# Patient Record
Sex: Female | Born: 1945
Health system: Southern US, Community
[De-identification: ages and names within clinical notes are randomized; demographics above are authoritative.]

## PROBLEM LIST (undated history)

## (undated) DIAGNOSIS — K449 Diaphragmatic hernia without obstruction or gangrene: Secondary | ICD-10-CM

## (undated) DIAGNOSIS — E785 Hyperlipidemia, unspecified: Secondary | ICD-10-CM

## (undated) DIAGNOSIS — K621 Rectal polyp: Secondary | ICD-10-CM

## (undated) DIAGNOSIS — K2960 Other gastritis without bleeding: Secondary | ICD-10-CM

## (undated) DIAGNOSIS — K648 Other hemorrhoids: Secondary | ICD-10-CM

## (undated) DIAGNOSIS — D126 Benign neoplasm of colon, unspecified: Secondary | ICD-10-CM

## (undated) DIAGNOSIS — E1142 Type 2 diabetes mellitus with diabetic polyneuropathy: Secondary | ICD-10-CM

## (undated) DIAGNOSIS — E039 Hypothyroidism, unspecified: Secondary | ICD-10-CM

## (undated) DIAGNOSIS — N289 Disorder of kidney and ureter, unspecified: Secondary | ICD-10-CM

## (undated) DIAGNOSIS — M1990 Unspecified osteoarthritis, unspecified site: Secondary | ICD-10-CM

## (undated) DIAGNOSIS — E119 Type 2 diabetes mellitus without complications: Secondary | ICD-10-CM

## (undated) DIAGNOSIS — I4891 Unspecified atrial fibrillation: Secondary | ICD-10-CM

## (undated) DIAGNOSIS — Z9981 Dependence on supplemental oxygen: Secondary | ICD-10-CM

## (undated) DIAGNOSIS — F419 Anxiety disorder, unspecified: Secondary | ICD-10-CM

## (undated) DIAGNOSIS — E2740 Unspecified adrenocortical insufficiency: Secondary | ICD-10-CM

## (undated) DIAGNOSIS — Z452 Encounter for adjustment and management of vascular access device: Secondary | ICD-10-CM

## (undated) DIAGNOSIS — R748 Abnormal levels of other serum enzymes: Secondary | ICD-10-CM

## (undated) DIAGNOSIS — L0390 Cellulitis, unspecified: Secondary | ICD-10-CM

## (undated) DIAGNOSIS — G8929 Other chronic pain: Secondary | ICD-10-CM

## (undated) DIAGNOSIS — IMO0002 Reserved for concepts with insufficient information to code with codable children: Secondary | ICD-10-CM

## (undated) DIAGNOSIS — K219 Gastro-esophageal reflux disease without esophagitis: Secondary | ICD-10-CM

## (undated) DIAGNOSIS — M542 Cervicalgia: Secondary | ICD-10-CM

## (undated) DIAGNOSIS — J321 Chronic frontal sinusitis: Secondary | ICD-10-CM

## (undated) DIAGNOSIS — K5792 Diverticulitis of intestine, part unspecified, without perforation or abscess without bleeding: Secondary | ICD-10-CM

## (undated) DIAGNOSIS — Z7901 Long term (current) use of anticoagulants: Secondary | ICD-10-CM

## (undated) DIAGNOSIS — Y249XXA Unspecified firearm discharge, undetermined intent, initial encounter: Secondary | ICD-10-CM

## (undated) DIAGNOSIS — K5790 Diverticulosis of intestine, part unspecified, without perforation or abscess without bleeding: Secondary | ICD-10-CM

## (undated) DIAGNOSIS — E271 Primary adrenocortical insufficiency: Secondary | ICD-10-CM

## (undated) DIAGNOSIS — I10 Essential (primary) hypertension: Secondary | ICD-10-CM

## (undated) DIAGNOSIS — I82409 Acute embolism and thrombosis of unspecified deep veins of unspecified lower extremity: Secondary | ICD-10-CM

## (undated) DIAGNOSIS — N189 Chronic kidney disease, unspecified: Secondary | ICD-10-CM

## (undated) DIAGNOSIS — Z794 Long term (current) use of insulin: Secondary | ICD-10-CM

## (undated) DIAGNOSIS — I2510 Atherosclerotic heart disease of native coronary artery without angina pectoris: Secondary | ICD-10-CM

## (undated) DIAGNOSIS — I5032 Chronic diastolic (congestive) heart failure: Secondary | ICD-10-CM

## (undated) DIAGNOSIS — G4730 Sleep apnea, unspecified: Secondary | ICD-10-CM

## (undated) DIAGNOSIS — R29898 Other symptoms and signs involving the musculoskeletal system: Secondary | ICD-10-CM

## (undated) DIAGNOSIS — J449 Chronic obstructive pulmonary disease, unspecified: Secondary | ICD-10-CM

## (undated) DIAGNOSIS — E538 Deficiency of other specified B group vitamins: Secondary | ICD-10-CM

## (undated) DIAGNOSIS — W3400XA Accidental discharge from unspecified firearms or gun, initial encounter: Secondary | ICD-10-CM

## (undated) DIAGNOSIS — E876 Hypokalemia: Secondary | ICD-10-CM

## (undated) DIAGNOSIS — M549 Dorsalgia, unspecified: Secondary | ICD-10-CM

## (undated) HISTORY — DX: Unspecified adrenocortical insufficiency: E27.40

## (undated) HISTORY — DX: Type 2 diabetes mellitus with diabetic polyneuropathy: E11.42

## (undated) HISTORY — DX: Chronic kidney disease, unspecified: N18.9

## (undated) HISTORY — DX: Other gastritis without bleeding: K29.60

## (undated) HISTORY — PX: ANTERIOR CERVICAL DECOMP/DISCECTOMY FUSION: SHX1161

## (undated) HISTORY — DX: Other hemorrhoids: K64.8

## (undated) HISTORY — PX: NOSE SURGERY: SHX723

## (undated) HISTORY — PX: CHOLECYSTECTOMY: SHX55

## (undated) HISTORY — DX: Diverticulosis of intestine, part unspecified, without perforation or abscess without bleeding: K57.90

## (undated) HISTORY — DX: Benign neoplasm of colon, unspecified: D12.6

## (undated) HISTORY — DX: Diverticulitis of intestine, part unspecified, without perforation or abscess without bleeding: K57.92

## (undated) HISTORY — DX: Disorder of kidney and ureter, unspecified: N28.9

## (undated) HISTORY — DX: Abnormal levels of other serum enzymes: R74.8

## (undated) HISTORY — DX: Anxiety disorder, unspecified: F41.9

## (undated) HISTORY — DX: Acute embolism and thrombosis of unspecified deep veins of unspecified lower extremity: I82.409

## (undated) HISTORY — PX: TUBAL LIGATION: SHX77

## (undated) HISTORY — DX: Unspecified atrial fibrillation: I48.91

## (undated) HISTORY — PX: ABDOMINAL HYSTERECTOMY: SHX81

## (undated) HISTORY — DX: Diaphragmatic hernia without obstruction or gangrene: K44.9

## (undated) HISTORY — PX: APPENDECTOMY: SHX54

## (undated) HISTORY — PX: FINGER SURGERY: SHX640

## (undated) HISTORY — DX: Unspecified osteoarthritis, unspecified site: M19.90

## (undated) HISTORY — PX: KNEE SURGERY: SHX244

---

## 1969-05-27 HISTORY — PX: ABDOMINAL SURGERY: SHX537

## 1998-05-27 HISTORY — PX: CORONARY ANGIOPLASTY WITH STENT PLACEMENT: SHX49

## 1998-09-07 ENCOUNTER — Inpatient Hospital Stay (HOSPITAL_COMMUNITY): Admission: AD | Admit: 1998-09-07 | Discharge: 1998-09-15 | Payer: Self-pay | Admitting: Cardiology

## 1998-09-14 ENCOUNTER — Encounter: Payer: Self-pay | Admitting: Cardiology

## 1999-07-13 ENCOUNTER — Inpatient Hospital Stay (HOSPITAL_COMMUNITY): Admission: EM | Admit: 1999-07-13 | Discharge: 1999-07-14 | Payer: Self-pay | Admitting: Emergency Medicine

## 2000-05-27 DIAGNOSIS — D126 Benign neoplasm of colon, unspecified: Secondary | ICD-10-CM

## 2000-05-27 HISTORY — DX: Benign neoplasm of colon, unspecified: D12.6

## 2000-09-10 ENCOUNTER — Encounter: Payer: Self-pay | Admitting: *Deleted

## 2000-09-10 ENCOUNTER — Emergency Department (HOSPITAL_COMMUNITY): Admission: EM | Admit: 2000-09-10 | Discharge: 2000-09-10 | Payer: Self-pay | Admitting: *Deleted

## 2000-09-11 ENCOUNTER — Encounter: Payer: Self-pay | Admitting: *Deleted

## 2000-09-18 ENCOUNTER — Ambulatory Visit (HOSPITAL_COMMUNITY): Admission: RE | Admit: 2000-09-18 | Discharge: 2000-09-19 | Payer: Self-pay | Admitting: Cardiology

## 2000-11-06 ENCOUNTER — Encounter: Payer: Self-pay | Admitting: Preventative Medicine

## 2000-11-06 ENCOUNTER — Ambulatory Visit (HOSPITAL_COMMUNITY): Admission: RE | Admit: 2000-11-06 | Discharge: 2000-11-06 | Payer: Self-pay | Admitting: Preventative Medicine

## 2000-11-24 ENCOUNTER — Ambulatory Visit (HOSPITAL_COMMUNITY): Admission: RE | Admit: 2000-11-24 | Discharge: 2000-11-24 | Payer: Self-pay | Admitting: Internal Medicine

## 2000-11-24 ENCOUNTER — Encounter: Payer: Self-pay | Admitting: Internal Medicine

## 2000-12-07 ENCOUNTER — Emergency Department (HOSPITAL_COMMUNITY): Admission: EM | Admit: 2000-12-07 | Discharge: 2000-12-07 | Payer: Self-pay | Admitting: Emergency Medicine

## 2000-12-07 ENCOUNTER — Encounter: Payer: Self-pay | Admitting: Emergency Medicine

## 2000-12-11 ENCOUNTER — Encounter: Payer: Self-pay | Admitting: Orthopedic Surgery

## 2000-12-11 ENCOUNTER — Encounter: Admission: RE | Admit: 2000-12-11 | Discharge: 2000-12-11 | Payer: Self-pay | Admitting: Orthopedic Surgery

## 2000-12-12 ENCOUNTER — Ambulatory Visit (HOSPITAL_BASED_OUTPATIENT_CLINIC_OR_DEPARTMENT_OTHER): Admission: RE | Admit: 2000-12-12 | Discharge: 2000-12-12 | Payer: Self-pay | Admitting: Orthopedic Surgery

## 2000-12-25 DIAGNOSIS — D126 Benign neoplasm of colon, unspecified: Secondary | ICD-10-CM

## 2000-12-25 HISTORY — DX: Benign neoplasm of colon, unspecified: D12.6

## 2001-01-19 ENCOUNTER — Encounter: Payer: Self-pay | Admitting: Gastroenterology

## 2001-01-19 ENCOUNTER — Ambulatory Visit (HOSPITAL_COMMUNITY): Admission: RE | Admit: 2001-01-19 | Discharge: 2001-01-19 | Payer: Self-pay | Admitting: Gastroenterology

## 2001-01-23 ENCOUNTER — Encounter (INDEPENDENT_AMBULATORY_CARE_PROVIDER_SITE_OTHER): Payer: Self-pay | Admitting: Specialist

## 2001-01-23 ENCOUNTER — Other Ambulatory Visit: Admission: RE | Admit: 2001-01-23 | Discharge: 2001-01-23 | Payer: Self-pay | Admitting: Gastroenterology

## 2001-05-05 ENCOUNTER — Encounter: Payer: Self-pay | Admitting: *Deleted

## 2001-05-05 ENCOUNTER — Emergency Department (HOSPITAL_COMMUNITY): Admission: EM | Admit: 2001-05-05 | Discharge: 2001-05-05 | Payer: Self-pay | Admitting: *Deleted

## 2002-04-11 ENCOUNTER — Emergency Department (HOSPITAL_COMMUNITY): Admission: EM | Admit: 2002-04-11 | Discharge: 2002-04-11 | Payer: Self-pay | Admitting: Emergency Medicine

## 2003-06-17 ENCOUNTER — Ambulatory Visit (HOSPITAL_COMMUNITY): Admission: RE | Admit: 2003-06-17 | Discharge: 2003-06-17 | Payer: Self-pay | Admitting: Gastroenterology

## 2003-09-16 ENCOUNTER — Emergency Department (HOSPITAL_COMMUNITY): Admission: EM | Admit: 2003-09-16 | Discharge: 2003-09-16 | Payer: Self-pay | Admitting: Emergency Medicine

## 2004-06-20 ENCOUNTER — Ambulatory Visit: Payer: Self-pay | Admitting: Cardiology

## 2004-08-08 ENCOUNTER — Encounter: Admission: RE | Admit: 2004-08-08 | Discharge: 2004-09-05 | Payer: Self-pay | Admitting: Orthopedic Surgery

## 2004-08-11 ENCOUNTER — Emergency Department (HOSPITAL_COMMUNITY): Admission: EM | Admit: 2004-08-11 | Discharge: 2004-08-11 | Payer: Self-pay | Admitting: Emergency Medicine

## 2004-12-30 ENCOUNTER — Emergency Department (HOSPITAL_COMMUNITY): Admission: EM | Admit: 2004-12-30 | Discharge: 2004-12-31 | Payer: Self-pay | Admitting: *Deleted

## 2005-06-24 ENCOUNTER — Ambulatory Visit: Payer: Self-pay | Admitting: Cardiology

## 2005-07-02 ENCOUNTER — Ambulatory Visit: Payer: Self-pay

## 2006-01-08 ENCOUNTER — Emergency Department (HOSPITAL_COMMUNITY): Admission: EM | Admit: 2006-01-08 | Discharge: 2006-01-08 | Payer: Self-pay | Admitting: Emergency Medicine

## 2006-04-04 ENCOUNTER — Emergency Department (HOSPITAL_COMMUNITY): Admission: EM | Admit: 2006-04-04 | Discharge: 2006-04-04 | Payer: Self-pay | Admitting: Emergency Medicine

## 2006-05-27 HISTORY — PX: COLONOSCOPY: SHX174

## 2006-05-28 ENCOUNTER — Ambulatory Visit: Payer: Self-pay | Admitting: Gastroenterology

## 2006-06-10 ENCOUNTER — Ambulatory Visit: Payer: Self-pay | Admitting: Cardiology

## 2006-06-10 LAB — CONVERTED CEMR LAB
Albumin: 3.7 g/dL (ref 3.5–5.2)
Bilirubin, Direct: 0.1 mg/dL (ref 0.0–0.3)
Calcium: 9.3 mg/dL (ref 8.4–10.5)
Cholesterol: 184 mg/dL (ref 0–200)
GFR calc Af Amer: 110 mL/min
GFR calc non Af Amer: 91 mL/min
Glucose, Bld: 93 mg/dL (ref 70–99)
HDL: 28.2 mg/dL — ABNORMAL LOW (ref >39.0)
LDL Cholesterol: 123 mg/dL — ABNORMAL HIGH (ref 0–99)
Sodium: 138 meq/L (ref 135–145)
Total CHOL/HDL Ratio: 6.5
VLDL: 32 mg/dL (ref 0–40)

## 2006-06-13 ENCOUNTER — Ambulatory Visit: Payer: Self-pay | Admitting: Gastroenterology

## 2006-06-13 ENCOUNTER — Encounter (INDEPENDENT_AMBULATORY_CARE_PROVIDER_SITE_OTHER): Payer: Self-pay | Admitting: Specialist

## 2006-06-15 ENCOUNTER — Emergency Department (HOSPITAL_COMMUNITY): Admission: EM | Admit: 2006-06-15 | Discharge: 2006-06-15 | Payer: Self-pay | Admitting: Emergency Medicine

## 2006-06-16 ENCOUNTER — Encounter: Admission: RE | Admit: 2006-06-16 | Discharge: 2006-06-16 | Payer: Self-pay | Admitting: Gastroenterology

## 2006-06-18 ENCOUNTER — Ambulatory Visit: Payer: Self-pay | Admitting: Gastroenterology

## 2006-07-25 ENCOUNTER — Emergency Department (HOSPITAL_COMMUNITY): Admission: EM | Admit: 2006-07-25 | Discharge: 2006-07-25 | Payer: Self-pay | Admitting: Emergency Medicine

## 2006-12-16 ENCOUNTER — Ambulatory Visit (HOSPITAL_COMMUNITY): Admission: RE | Admit: 2006-12-16 | Discharge: 2006-12-16 | Payer: Self-pay | Admitting: Pulmonary Disease

## 2007-02-18 ENCOUNTER — Ambulatory Visit (HOSPITAL_COMMUNITY): Admission: RE | Admit: 2007-02-18 | Discharge: 2007-02-18 | Payer: Self-pay | Admitting: Pulmonary Disease

## 2007-04-08 ENCOUNTER — Emergency Department (HOSPITAL_COMMUNITY): Admission: EM | Admit: 2007-04-08 | Discharge: 2007-04-09 | Payer: Self-pay | Admitting: Emergency Medicine

## 2007-05-26 ENCOUNTER — Ambulatory Visit (HOSPITAL_COMMUNITY): Admission: RE | Admit: 2007-05-26 | Discharge: 2007-05-26 | Payer: Self-pay | Admitting: Pulmonary Disease

## 2007-06-03 ENCOUNTER — Ambulatory Visit: Payer: Self-pay | Admitting: Cardiology

## 2007-06-15 ENCOUNTER — Ambulatory Visit: Payer: Self-pay

## 2007-10-03 ENCOUNTER — Emergency Department (HOSPITAL_COMMUNITY): Admission: EM | Admit: 2007-10-03 | Discharge: 2007-10-03 | Payer: Self-pay | Admitting: Emergency Medicine

## 2007-11-26 ENCOUNTER — Ambulatory Visit (HOSPITAL_COMMUNITY): Admission: RE | Admit: 2007-11-26 | Discharge: 2007-11-26 | Payer: Self-pay | Admitting: Pulmonary Disease

## 2008-02-16 ENCOUNTER — Ambulatory Visit (HOSPITAL_COMMUNITY): Admission: RE | Admit: 2008-02-16 | Discharge: 2008-02-16 | Payer: Self-pay | Admitting: Pulmonary Disease

## 2008-03-20 ENCOUNTER — Emergency Department (HOSPITAL_COMMUNITY): Admission: EM | Admit: 2008-03-20 | Discharge: 2008-03-20 | Payer: Self-pay | Admitting: Emergency Medicine

## 2008-04-12 ENCOUNTER — Inpatient Hospital Stay (HOSPITAL_COMMUNITY): Admission: EM | Admit: 2008-04-12 | Discharge: 2008-04-14 | Payer: Self-pay | Admitting: Emergency Medicine

## 2008-04-13 ENCOUNTER — Other Ambulatory Visit: Payer: Self-pay | Admitting: Cardiology

## 2008-04-13 ENCOUNTER — Ambulatory Visit: Payer: Self-pay | Admitting: Cardiology

## 2008-04-14 ENCOUNTER — Other Ambulatory Visit: Payer: Self-pay | Admitting: Cardiology

## 2008-05-04 ENCOUNTER — Ambulatory Visit: Payer: Self-pay | Admitting: Cardiology

## 2008-05-24 ENCOUNTER — Ambulatory Visit (HOSPITAL_COMMUNITY): Admission: RE | Admit: 2008-05-24 | Discharge: 2008-05-24 | Payer: Self-pay | Admitting: Pulmonary Disease

## 2008-07-13 ENCOUNTER — Emergency Department (HOSPITAL_COMMUNITY): Admission: EM | Admit: 2008-07-13 | Discharge: 2008-07-13 | Payer: Self-pay | Admitting: Emergency Medicine

## 2008-11-24 ENCOUNTER — Encounter: Payer: Self-pay | Admitting: Cardiology

## 2008-11-24 ENCOUNTER — Ambulatory Visit: Payer: Self-pay | Admitting: Cardiology

## 2008-12-01 DIAGNOSIS — I10 Essential (primary) hypertension: Secondary | ICD-10-CM

## 2008-12-01 DIAGNOSIS — N189 Chronic kidney disease, unspecified: Secondary | ICD-10-CM | POA: Insufficient documentation

## 2008-12-01 DIAGNOSIS — E039 Hypothyroidism, unspecified: Secondary | ICD-10-CM

## 2008-12-01 DIAGNOSIS — K219 Gastro-esophageal reflux disease without esophagitis: Secondary | ICD-10-CM

## 2008-12-01 DIAGNOSIS — I2510 Atherosclerotic heart disease of native coronary artery without angina pectoris: Secondary | ICD-10-CM

## 2008-12-01 DIAGNOSIS — E782 Mixed hyperlipidemia: Secondary | ICD-10-CM

## 2008-12-02 ENCOUNTER — Encounter: Payer: Self-pay | Admitting: Cardiology

## 2008-12-02 ENCOUNTER — Ambulatory Visit: Payer: Self-pay | Admitting: Cardiology

## 2008-12-06 ENCOUNTER — Telehealth (INDEPENDENT_AMBULATORY_CARE_PROVIDER_SITE_OTHER): Payer: Self-pay | Admitting: *Deleted

## 2008-12-07 LAB — CONVERTED CEMR LAB
CO2: 23 meq/L (ref 19–32)
Chloride: 106 meq/L (ref 96–112)
Sodium: 140 meq/L (ref 135–145)

## 2008-12-29 ENCOUNTER — Encounter: Payer: Self-pay | Admitting: Cardiology

## 2009-01-19 ENCOUNTER — Ambulatory Visit: Payer: Self-pay | Admitting: Cardiology

## 2009-01-19 DIAGNOSIS — R609 Edema, unspecified: Secondary | ICD-10-CM

## 2009-03-31 ENCOUNTER — Ambulatory Visit (HOSPITAL_COMMUNITY): Admission: RE | Admit: 2009-03-31 | Discharge: 2009-03-31 | Payer: Self-pay | Admitting: Pulmonary Disease

## 2009-07-26 ENCOUNTER — Ambulatory Visit (HOSPITAL_COMMUNITY): Admission: RE | Admit: 2009-07-26 | Discharge: 2009-07-26 | Payer: Self-pay | Admitting: Pulmonary Disease

## 2009-08-04 ENCOUNTER — Ambulatory Visit: Payer: Self-pay | Admitting: Cardiology

## 2009-08-04 DIAGNOSIS — E663 Overweight: Secondary | ICD-10-CM | POA: Insufficient documentation

## 2009-08-08 LAB — CONVERTED CEMR LAB
ALT: 24 units/L (ref 0–35)
BUN: 23 mg/dL (ref 6–23)
CO2: 22 meq/L (ref 19–32)
Calcium: 9.5 mg/dL (ref 8.4–10.5)
Chloride: 108 meq/L (ref 96–112)
Creatinine, Ser: 0.93 mg/dL (ref 0.40–1.20)
Glucose, Bld: 116 mg/dL — ABNORMAL HIGH (ref 70–99)

## 2009-08-09 ENCOUNTER — Encounter (INDEPENDENT_AMBULATORY_CARE_PROVIDER_SITE_OTHER): Payer: Self-pay | Admitting: *Deleted

## 2009-09-18 ENCOUNTER — Ambulatory Visit (HOSPITAL_COMMUNITY): Admission: RE | Admit: 2009-09-18 | Discharge: 2009-09-18 | Payer: Self-pay | Admitting: Pulmonary Disease

## 2009-10-12 ENCOUNTER — Ambulatory Visit (HOSPITAL_COMMUNITY): Admission: RE | Admit: 2009-10-12 | Discharge: 2009-10-12 | Payer: Self-pay | Admitting: Pulmonary Disease

## 2009-11-03 ENCOUNTER — Ambulatory Visit: Payer: Self-pay | Admitting: Cardiology

## 2009-11-17 ENCOUNTER — Inpatient Hospital Stay (HOSPITAL_COMMUNITY): Admission: RE | Admit: 2009-11-17 | Discharge: 2009-11-18 | Payer: Self-pay | Admitting: Neurosurgery

## 2009-12-21 ENCOUNTER — Ambulatory Visit (HOSPITAL_COMMUNITY): Admission: RE | Admit: 2009-12-21 | Discharge: 2009-12-21 | Payer: Self-pay | Admitting: Pulmonary Disease

## 2010-01-12 ENCOUNTER — Encounter (INDEPENDENT_AMBULATORY_CARE_PROVIDER_SITE_OTHER): Payer: Self-pay | Admitting: Pulmonary Disease

## 2010-01-12 ENCOUNTER — Ambulatory Visit (HOSPITAL_COMMUNITY): Admission: RE | Admit: 2010-01-12 | Discharge: 2010-01-12 | Payer: Self-pay | Admitting: Pulmonary Disease

## 2010-01-12 ENCOUNTER — Ambulatory Visit: Payer: Self-pay | Admitting: Cardiology

## 2010-02-21 ENCOUNTER — Ambulatory Visit: Payer: Self-pay | Admitting: Orthopedic Surgery

## 2010-02-21 DIAGNOSIS — M6740 Ganglion, unspecified site: Secondary | ICD-10-CM | POA: Insufficient documentation

## 2010-02-23 ENCOUNTER — Encounter: Payer: Self-pay | Admitting: Orthopedic Surgery

## 2010-02-26 ENCOUNTER — Telehealth: Payer: Self-pay | Admitting: Orthopedic Surgery

## 2010-03-01 ENCOUNTER — Encounter: Payer: Self-pay | Admitting: Orthopedic Surgery

## 2010-03-02 ENCOUNTER — Ambulatory Visit (HOSPITAL_COMMUNITY)
Admission: RE | Admit: 2010-03-02 | Discharge: 2010-03-02 | Payer: Self-pay | Source: Home / Self Care | Admitting: Orthopedic Surgery

## 2010-03-02 ENCOUNTER — Ambulatory Visit: Payer: Self-pay | Admitting: Orthopedic Surgery

## 2010-03-06 ENCOUNTER — Encounter: Payer: Self-pay | Admitting: Orthopedic Surgery

## 2010-03-07 ENCOUNTER — Ambulatory Visit: Payer: Self-pay | Admitting: Orthopedic Surgery

## 2010-03-08 ENCOUNTER — Encounter: Payer: Self-pay | Admitting: Orthopedic Surgery

## 2010-03-14 ENCOUNTER — Ambulatory Visit: Payer: Self-pay | Admitting: Orthopedic Surgery

## 2010-05-03 ENCOUNTER — Ambulatory Visit: Payer: Self-pay | Admitting: Cardiology

## 2010-05-09 ENCOUNTER — Telehealth (INDEPENDENT_AMBULATORY_CARE_PROVIDER_SITE_OTHER): Payer: Self-pay | Admitting: *Deleted

## 2010-05-27 DIAGNOSIS — I5032 Chronic diastolic (congestive) heart failure: Secondary | ICD-10-CM

## 2010-05-27 DIAGNOSIS — L0390 Cellulitis, unspecified: Secondary | ICD-10-CM

## 2010-05-27 HISTORY — DX: Cellulitis, unspecified: L03.90

## 2010-05-27 HISTORY — DX: Chronic diastolic (congestive) heart failure: I50.32

## 2010-06-26 NOTE — Letter (Signed)
Summary: History form  History form   Imported By: Jacklynn Ganong 02/21/2010 13:28:25  _____________________________________________________________________  External Attachment:    Type:   Image     Comment:   External Document
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
## 2010-06-26 NOTE — Letter (Signed)
Summary: [facility]  [facility]   Imported By: Faythe Ghee 11/03/2009 14:22:35  _____________________________________________________________________  External Attachment:    Type:   Image     Comment:   External Document

## 2010-06-26 NOTE — Letter (Signed)
Summary: *Consult Note  [facility]  [address]  [place], [place] [address]   Phone: [phone]  Fax: [phone]    Re:    Ann [name] DOB:    1945/11/26   Dear:    [name] you for requesting that we see the above patient for consultation.  A copy of the detailed office note will be sent under separate cover, for your review.  Evaluation today is consistent with:    Our recommendation is for:    New Orders include:    New Medications started today include:    After today's visit, the patients current medications include: 1)  VYTORIN 10-20 MG TABS (EZETIMIBE-SIMVASTATIN) Take 1 tablet by mouth once a day 2)  ASPIRIN 81 MG TBEC (ASPIRIN) Take one tablet by mouth daily 3)  DETROL LA 4 MG XR24H-CAP (TOLTERODINE TARTRATE) Take 1 tablet by mouth once a day 4)  FUROSEMIDE 40 MG TABS (FUROSEMIDE) take 1 tab daily 5)  NITROGLYCERIN 0.4 MG SUBL (NITROGLYCERIN) One tablet under tongue every 5 minutes as needed for chest pain---may repeat times three 6)  ADVAIR DISKUS 250-50 MCG/DOSE MISC (FLUTICASONE-SALMETEROL) 1 puff two times a day 7)  METOPROLOL SUCCINATE 25 MG XR24H-TAB (METOPROLOL SUCCINATE) 1 tab once daily 8)  NEXIUM 40 MG CPDR (ESOMEPRAZOLE MAGNESIUM) take 1 tab two times a day 9)  QUINAPRIL HCL 40 MG TABS (QUINAPRIL HCL) take 1 tab daily 10)  KLOR-CON 20 MEQ PACK (POTASSIUM CHLORIDE) take 2 tabs daily 11)  LEVOTHROID 175 MCG TABS (LEVOTHYROXINE SODIUM) take 1 tab daily 12)  NAPROSYN 500 MG TABS (NAPROXEN) take 1 tab two times a day 13)  CEFUROXIME AXETIL 500 MG TABS (CEFUROXIME AXETIL) take 1 tab by mouth two times a day 14)  CORFEN-DM 02-27-14 MG/5ML LIQD (PHENYLEPHRINE-CHLORPHEN-DM) take 1 teaspoonful q4hrs as needed for cough   Thank you for this consultation.  If you have any further questions regarding the care of this patient, please do not hesitate to contact me @   Thank you for this opportunity to look after your  patient.  Sincerely,   Fuller Canada MD

## 2010-06-26 NOTE — Letter (Signed)
Summary: [place] [facility]  [address] [place] [address]   Phone: [phone]  Fax: [phone]      August 09, 2009 MRN: [id number]   [facility] [address] [place], [place]  [address]   Dear Ms. Hesketh,  Your test ordered by Ann [name] has been reviewed by your physician (or physician assistant) and was found to be normal or stable. Your physician (or physician assistant) felt no changes were needed at this time.  ____ Echocardiogram  ____ Cardiac Stress Test  _x___ Lab Work  ____ Peripheral vascular study of arms, legs or neck  ____ CT scan or X-ray  ____ Lung or Breathing test  ____ Other:  No change in medical treatment at this time, per Dr. Daleen Squibb.  Enclosed is a copy of your labwork for your records.  Thank you, Teyton Pattillo Allyne Gee RN    [place] Bing, MD, Lenise Arena.C.Gaylord Shih, MD, F.A.C.C Lewayne Bunting, MD, F.A.C.C Nona Dell, MD, F.A.C.C Charlton Haws, MD, Lenise Arena.C.C

## 2010-06-26 NOTE — Assessment & Plan Note (Signed)
Summary: POST OP RT INDEX FINGER/CHAMPVA/CAF   Visit Type:  Follow-up Referring Oryn Casanova:  Dr. Juanetta Gosling Primary Nathanie Ottley:  Shaune Pollack MD  CC:  POST OP 2 RT FINGER.  History of Present Illness: I saw Ann [name] in the office today for a followup visit.  She is a 65 years old woman with the complaint of:  post op, right index finger  A mass was removed from the RIGHT index finger and it turned out to be a ganglion cyst of tendon sheath path report has been reviewed  The incision looks good.  No signs of infection.  Today is one week recheck, remove sutures.  No pain meds needed.  Doing great.  FROM in hte finger   Discharged      Allergies: 1)  ! * Niaspan   Other Orders: Post-Op Check (14782)  Patient Instructions: 1)  Please schedule a follow-up appointment as needed.   Orders Added: 1)  Post-Op Check [95621]

## 2010-06-26 NOTE — Letter (Signed)
Summary: Hospital medication discharge summary  Hospital medication discharge summary   Imported By: Jacklynn Ganong 03/06/2010 16:10:41  _____________________________________________________________________  External Attachment:    Type:   Image     Comment:   External Document

## 2010-06-26 NOTE — Letter (Signed)
Summary: surgery order RT index finger sched 03/02/10  surgery order RT index finger sched 03/02/10   Imported By: Cammie Sickle 02/22/2010 08:36:37  _____________________________________________________________________  External Attachment:    Type:   Image     Comment:   External Document

## 2010-06-26 NOTE — Assessment & Plan Note (Signed)
Summary: POST OP 1/SURG RT INDEX FINGER/CHAMPVA/CAF   Visit Type:  post op Referring Pricsilla Lindvall:  Dr. Juanetta Gosling Primary Sarya Linenberger:  Shaune Pollack MD  CC:  right index finger.  History of Present Illness: I saw Greg Tudor in the office today for a followup visit.  She is a 65 years old woman with the complaint of:  post op, right index finger  A mass was removed from the RIGHT index finger and it turned out to be a ganglion cyst of tendon sheath path report has been reviewed  The incision looks good.  No signs of infection.  Follow up in a week to remove the sutures    Allergies: 1)  ! * Niaspan   Other Orders: Post-Op Check ([address])
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
## 2010-06-26 NOTE — Miscellaneous (Signed)
Summary: call to insurer [employer]  Clinical Lists Changes  all to insurer [employer] ph [phone] regarding pre-auth requirements for out-patient surgery scheduled 03/02/10, [facility], Removal of mass RT index finger (dx [phone])  - verify codes, and call back  CPT ________ Note:  most out-patient surgical procedures do not require pre-cert under this plan per Vernona Rieger, [place] [facility] ph#  [phone]

## 2010-06-26 NOTE — Progress Notes (Signed)
Summary: Np prior authoriztion for out-patient surgery  Phone Note Outgoing Call   Call placed to: Insurer Summary of Call: Call back to insurance carrier [employer] to give CPT 26115 for out-patient surgery 03/02/10 [facility]   Pper Vanessa Barbara, no prior authorization required although it is recommended for our billing service to send medical documentation when filing the claim.  I've relayed this info to our billing office. Initial call taken by: Cammie Sickle,  February 26, 2010 1:02 PM

## 2010-06-28 NOTE — Progress Notes (Signed)
Summary: NEED NEW RX  Phone Note Call from Patient Call back at Home Phone [phone]   Caller: pt Reason for Call: Refill Medication Summary of Call: PT [facility]S [facility] Initial call taken by: Faythe Ghee,  May 09, 2010 1:25 PM    Prescriptions: TORSEMIDE 100 MG TABS (TORSEMIDE) TAKE 1/2 TAB DAILY  #45 x 3   Entered by:   Teressa Lower RN   Authorized by:   Gaylord Shih, MD, [facility]   Signed by:   Teressa Lower RN on 05/09/2010   Method used:   Print then Give to Patient   RxID:   [id number] TEKTURNA 300 MG TABS (ALISKIREN FUMARATE) Take 1 tablet po once daily  #90 x 3   Entered by:   Teressa Lower RN   Authorized by:   Gaylord Shih, MD, [facility]   Signed by:   Teressa Lower RN on 05/09/2010   Method used:   Print then Give to Patient   RxID:   [id number] KLOR-CON 20 MEQ PACK (POTASSIUM CHLORIDE) take 1 TAB DAILY  #90 x 3   Entered by:   Teressa Lower RN   Authorized by:   Gaylord Shih, MD, [facility]   Signed by:   Teressa Lower RN on 05/09/2010   Method used:   Print then Give to Patient   RxID:   [id number] DETROL LA 4 MG XR24H-CAP (TOLTERODINE TARTRATE) Take 1 tablet by mouth once a day  #90 x 3   Entered by:   Teressa Lower RN   Authorized by:   Gaylord Shih, MD, [facility]   Signed by:   Teressa Lower RN on 05/09/2010   Method used:   Print then Give to Patient   RxID:   [id number] QUINAPRIL HCL 40 MG TABS (QUINAPRIL HCL) take 1 tab daily  #90 x 3   Entered by:   Teressa Lower RN   Authorized by:   Gaylord Shih, MD, [facility]   Signed by:   Teressa Lower RN on 05/09/2010   Method used:   Print then Give to Patient   RxID:   [id number] NEXIUM 40 MG CPDR (ESOMEPRAZOLE MAGNESIUM) take 1 tab two times a day  #180 x 3   Entered by:   Teressa Lower RN   Authorized by:   Gaylord Shih, MD, [facility]   Signed by:   Teressa Lower RN on 05/09/2010   Method used:   Print then Give to Patient   RxID:   [id number] METOPROLOL  SUCCINATE 25 MG XR24H-TAB (METOPROLOL SUCCINATE) 1 tab once daily  #90 x 3   Entered by:   Teressa Lower RN   Authorized by:   Gaylord Shih, MD, [facility]   Signed by:   Teressa Lower RN on 05/09/2010   Method used:   Print then Give to Patient   RxID:   [id number] NITROGLYCERIN 0.4 MG SUBL (NITROGLYCERIN) One tablet under tongue every 5 minutes as needed for chest pain---may repeat times three  #25 x 3   Entered by:   Teressa Lower RN   Authorized by:   Gaylord Shih, MD, [facility]   Signed by:   Teressa Lower RN on 05/09/2010   Method used:   Print then Give to Patient   RxID:   [id number] VYTORIN 10-20 MG TABS (EZETIMIBE-SIMVASTATIN) Take 1 tablet by mouth once a day  #90 x 3   Entered by:   Teressa Lower RN  Authorized by:   Gaylord Shih, MD, [facility]   Signed by:   Teressa Lower RN on 05/09/2010   Method used:   Print then Give to Patient   RxID:   [id number]  rx printed and placed at front desk for pick

## 2010-08-08 LAB — BASIC METABOLIC PANEL WITH GFR
BUN: 13 mg/dL (ref 6–23)
CO2: 24 meq/L (ref 19–32)
Calcium: 9.1 mg/dL (ref 8.4–10.5)
Chloride: 109 meq/L (ref 96–112)
Creatinine, Ser: 1.13 mg/dL (ref 0.4–1.2)
GFR calc non Af Amer: 48 mL/min — ABNORMAL LOW
Glucose, Bld: 162 mg/dL — ABNORMAL HIGH (ref 70–99)
Potassium: 4.4 meq/L (ref 3.5–5.1)
Sodium: 139 meq/L (ref 135–145)

## 2010-08-08 LAB — HEMOGLOBIN AND HEMATOCRIT, BLOOD
HCT: 35 % — ABNORMAL LOW (ref 36.0–46.0)
Hemoglobin: 11.8 g/dL — ABNORMAL LOW (ref 12.0–15.0)

## 2010-08-08 LAB — SURGICAL PCR SCREEN: Staphylococcus aureus: NEGATIVE

## 2010-08-13 LAB — BASIC METABOLIC PANEL
CO2: 26 mEq/L (ref 19–32)
Calcium: 9.5 mg/dL (ref 8.4–10.5)
Creatinine, Ser: 1.02 mg/dL (ref 0.4–1.2)
GFR calc Af Amer: >60 mL/min (ref >60)

## 2010-08-13 LAB — COMPREHENSIVE METABOLIC PANEL
Albumin: 3.4 g/dL — ABNORMAL LOW (ref 3.5–5.2)
BUN: 15 mg/dL (ref 6–23)
Creatinine, Ser: 0.94 mg/dL (ref 0.4–1.2)
Potassium: 4.8 mEq/L (ref 3.5–5.1)
Total Protein: 6.7 g/dL (ref 6.0–8.3)

## 2010-08-13 LAB — SURGICAL PCR SCREEN
MRSA, PCR: NEGATIVE
Staphylococcus aureus: NEGATIVE

## 2010-08-13 LAB — CBC
MCHC: 33.8 g/dL (ref 30.0–36.0)
RBC: 4.08 MIL/uL (ref 3.87–5.11)
RDW: 13.3 % (ref 11.5–15.5)

## 2010-08-22 ENCOUNTER — Other Ambulatory Visit (HOSPITAL_COMMUNITY): Payer: Self-pay | Admitting: Pulmonary Disease

## 2010-08-22 ENCOUNTER — Ambulatory Visit (HOSPITAL_COMMUNITY)
Admission: RE | Admit: 2010-08-22 | Discharge: 2010-08-22 | Disposition: A | Discharge status: Home / Self Care | Source: Ambulatory Visit | Attending: Pulmonary Disease | Admitting: Pulmonary Disease

## 2010-08-22 DIAGNOSIS — M545 Low back pain, unspecified: Secondary | ICD-10-CM | POA: Insufficient documentation

## 2010-09-11 LAB — DIFFERENTIAL
Basophils Absolute: 0 10*3/uL (ref 0.0–0.1)
Basophils Relative: 0 % (ref 0–1)
Eosinophils Relative: 2 % (ref 0–5)
Monocytes Absolute: 0.5 10*3/uL (ref 0.1–1.0)

## 2010-09-11 LAB — BASIC METABOLIC PANEL
CO2: 27 mEq/L (ref 19–32)
Chloride: 106 mEq/L (ref 96–112)
Glucose, Bld: 105 mg/dL — ABNORMAL HIGH (ref 70–99)
Potassium: 4.3 mEq/L (ref 3.5–5.1)
Sodium: 139 mEq/L (ref 135–145)

## 2010-09-11 LAB — IRON AND TIBC
TIBC: 331 ug/dL (ref 250–470)
UIBC: 238 ug/dL

## 2010-09-11 LAB — URINALYSIS, ROUTINE W REFLEX MICROSCOPIC
Bilirubin Urine: NEGATIVE
Hgb urine dipstick: NEGATIVE
Ketones, ur: NEGATIVE mg/dL
Nitrite: NEGATIVE
pH: 7 (ref 5.0–8.0)

## 2010-09-11 LAB — CBC
HCT: 40.3 % (ref 36.0–46.0)
Hemoglobin: 13.8 g/dL (ref 12.0–15.0)
MCHC: 34.2 g/dL (ref 30.0–36.0)
RDW: 13.3 % (ref 11.5–15.5)

## 2010-09-11 LAB — FERRITIN: Ferritin: 113 ng/mL (ref 10–291)

## 2010-09-11 LAB — GLUCOSE, CAPILLARY: Glucose-Capillary: 100 mg/dL — ABNORMAL HIGH (ref 70–99)

## 2010-09-14 ENCOUNTER — Inpatient Hospital Stay (HOSPITAL_COMMUNITY)
Admission: EM | Admit: 2010-09-14 | Discharge: 2010-09-16 | DRG: 645 | Disposition: A | Discharge status: Home / Self Care | Payer: Medicare Other | Attending: Pulmonary Disease | Admitting: Pulmonary Disease

## 2010-09-14 ENCOUNTER — Emergency Department (HOSPITAL_COMMUNITY): Payer: Medicare Other

## 2010-09-14 DIAGNOSIS — I10 Essential (primary) hypertension: Secondary | ICD-10-CM | POA: Diagnosis present

## 2010-09-14 DIAGNOSIS — I2510 Atherosclerotic heart disease of native coronary artery without angina pectoris: Secondary | ICD-10-CM | POA: Diagnosis present

## 2010-09-14 DIAGNOSIS — J4489 Other specified chronic obstructive pulmonary disease: Secondary | ICD-10-CM | POA: Diagnosis present

## 2010-09-14 DIAGNOSIS — Z8711 Personal history of peptic ulcer disease: Secondary | ICD-10-CM

## 2010-09-14 DIAGNOSIS — J449 Chronic obstructive pulmonary disease, unspecified: Secondary | ICD-10-CM | POA: Diagnosis present

## 2010-09-14 DIAGNOSIS — Z9861 Coronary angioplasty status: Secondary | ICD-10-CM

## 2010-09-14 DIAGNOSIS — K219 Gastro-esophageal reflux disease without esophagitis: Secondary | ICD-10-CM | POA: Diagnosis present

## 2010-09-14 DIAGNOSIS — E2749 Other adrenocortical insufficiency: Principal | ICD-10-CM | POA: Diagnosis present

## 2010-09-14 DIAGNOSIS — E785 Hyperlipidemia, unspecified: Secondary | ICD-10-CM | POA: Diagnosis present

## 2010-09-14 DIAGNOSIS — E039 Hypothyroidism, unspecified: Secondary | ICD-10-CM | POA: Diagnosis present

## 2010-09-14 DIAGNOSIS — I9589 Other hypotension: Secondary | ICD-10-CM | POA: Diagnosis present

## 2010-09-14 LAB — BASIC METABOLIC PANEL
BUN: 16 mg/dL (ref 6–23)
CO2: 21 mEq/L (ref 19–32)
Chloride: 104 mEq/L (ref 96–112)
GFR calc non Af Amer: 43 mL/min — ABNORMAL LOW (ref >60)
Glucose, Bld: 150 mg/dL — ABNORMAL HIGH (ref 70–99)
Potassium: 4.1 mEq/L (ref 3.5–5.1)

## 2010-09-14 LAB — CBC
HCT: 40.4 % (ref 36.0–46.0)
MCHC: 33.2 g/dL (ref 30.0–36.0)
Platelets: 258 10*3/uL (ref 150–400)
RDW: 14.2 % (ref 11.5–15.5)
WBC: 9 10*3/uL (ref 4.0–10.5)

## 2010-09-14 LAB — DIFFERENTIAL
Basophils Absolute: 0 10*3/uL (ref 0.0–0.1)
Basophils Relative: 0 % (ref 0–1)
Eosinophils Absolute: 0.4 10*3/uL (ref 0.0–0.7)
Eosinophils Relative: 4 % (ref 0–5)
Lymphocytes Relative: 35 % (ref 12–46)

## 2010-09-14 LAB — POCT CARDIAC MARKERS: Troponin i, poc: <0.05 ng/mL (ref 0.00–0.09)

## 2010-09-15 ENCOUNTER — Inpatient Hospital Stay (HOSPITAL_COMMUNITY): Payer: Medicare Other

## 2010-09-15 LAB — CBC
HCT: 35.8 % — ABNORMAL LOW (ref 36.0–46.0)
Hemoglobin: 11.7 g/dL — ABNORMAL LOW (ref 12.0–15.0)
MCV: 88.4 fL (ref 78.0–100.0)
RBC: 4.05 MIL/uL (ref 3.87–5.11)
WBC: 7.2 10*3/uL (ref 4.0–10.5)

## 2010-09-15 LAB — COMPREHENSIVE METABOLIC PANEL
ALT: 39 U/L — ABNORMAL HIGH (ref 0–35)
Albumin: 3.3 g/dL — ABNORMAL LOW (ref 3.5–5.2)
Alkaline Phosphatase: 95 U/L (ref 39–117)
BUN: 16 mg/dL (ref 6–23)
Calcium: 8.6 mg/dL (ref 8.4–10.5)
Potassium: 4.4 mEq/L (ref 3.5–5.1)
Sodium: 133 mEq/L — ABNORMAL LOW (ref 135–145)
Total Protein: 6 g/dL (ref 6.0–8.3)

## 2010-09-15 LAB — CARDIAC PANEL(CRET KIN+CKTOT+MB+TROPI)
CK, MB: 3.6 ng/mL (ref 0.3–4.0)
Relative Index: 3.3 — ABNORMAL HIGH (ref 0.0–2.5)
Relative Index: 2.6 — ABNORMAL HIGH (ref 0.0–2.5)
Total CK: 108 U/L (ref 7–177)
Total CK: 154 U/L (ref 7–177)
Troponin I: 0.02 ng/mL (ref 0.00–0.06)

## 2010-09-15 LAB — TSH: TSH: 9.238 u[IU]/mL — ABNORMAL HIGH (ref 0.350–4.500)

## 2010-09-15 LAB — DIFFERENTIAL
Basophils Absolute: 0 10*3/uL (ref 0.0–0.1)
Lymphocytes Relative: 27 % (ref 12–46)
Lymphs Abs: 1.9 10*3/uL (ref 0.7–4.0)
Neutro Abs: 4.5 10*3/uL (ref 1.7–7.7)
Neutrophils Relative %: 62 % (ref 43–77)

## 2010-09-15 LAB — T4, FREE: Free T4: 1.02 ng/dL (ref 0.80–1.80)

## 2010-09-16 LAB — BASIC METABOLIC PANEL
CO2: 25 mEq/L (ref 19–32)
Calcium: 8.4 mg/dL (ref 8.4–10.5)
Creatinine, Ser: 0.99 mg/dL (ref 0.4–1.2)
GFR calc non Af Amer: 56 mL/min — ABNORMAL LOW (ref >60)
Glucose, Bld: 137 mg/dL — ABNORMAL HIGH (ref 70–99)
Sodium: 139 mEq/L (ref 135–145)

## 2010-09-23 NOTE — Group Therapy Note (Signed)
  NAME:  Ann [name], Ann [name]              ACCOUNT NO.:  [id number]  MEDICAL RECORD NO.:  [id number]           PATIENT TYPE:  I  LOCATION:  A315                          FACILITY:  [facility]  PHYSICIAN:  Dia Jefferys L. Juanetta Gosling, M.D.DATE OF BIRTH:  July 30, 1945  DATE OF PROCEDURE: DATE OF DISCHARGE:                                PROGRESS NOTE   [name] is overall much improved and says she wants to go home. She has not had any more episodes of numbness.  She does not feel as weak.  She is overall better.  Her exam shows her temperature is 97.5, pulse 71, respirations 20, blood pressure 143/91, O2 sats 94% on 2 liters.  Her chest is clear.  She looks much more comfortable.  Her morning cortisol level was somewhat low at 3.7, normal 4.3-22.4 and I have discussed her situation with Dr. Fransico Him, the endocrinologist and who admitted her and he feels if she is ready for discharge otherwise, she could be discharged home, see him in his office for a workup for low adrenal, and I will plan to do that.     Suad Autrey L. Juanetta Gosling, M.D.     ELH/MEDQ  D:  09/16/2010  T:  09/16/2010  Job:  [id number]  Electronically Signed by Kari Baars M.D. on 09/23/2010 02:36:34 PM

## 2010-09-23 NOTE — H&P (Signed)
NAME:  Ann [name], Ann [name]              ACCOUNT NO.:  [id number]  MEDICAL RECORD NO.:  [id number]           PATIENT TYPE:  I  LOCATION:  A315                          FACILITY:  [facility]  PHYSICIAN:  Key Cen L. Juanetta Gosling, M.D.DATE OF BIRTH:  15-May-1946  DATE OF ADMISSION:  09/14/2010 DATE OF DISCHARGE:  LH                             HISTORY & PHYSICAL   REASON FOR ADMISSION:  Hypotension.  HISTORY:  Ann [name] is a 65 year old who came to the emergency room with weakness.  She said that she had some problems with weakness more on the left side than on the right and had the sense of some numbness on the left side.  She feels very weak and fatigued in general.  She has not had any fever or chills.  She has not had any problems swallowing. She has not had any problems with her speech.  When she came to the emergency room, she was noted to be hypotensive and was brought in for IV fluids and to make sure that her blood pressure stabilizes.  PAST MEDICAL HISTORY: 1. Coronary artery occlusive disease. 2. Hypertension. 3. GERD. 4. Hyperlipidemia. 5. Hypothyroidism. 6. Peptic ulcer disease. 7. COPD.  PAST SURGICAL HISTORY:  She has had a cholecystectomy, hysterectomy, and tubal ligation.  She has had a cardiac stent and she has had some surgery on her neck.  FAMILY HISTORY:  Positive for hypertension and cardiac disease the extent of which is unknown at this point.  SOCIAL HISTORY:  She lives at home with her husband.  She does not use any alcohol.  She uses snuff but does not smoke and she does not use any illicit drugs.  REVIEW OF SYSTEMS:  Except as mentioned is negative.  PHYSICAL EXAMINATION:  GENERAL:  She is awake and alert.  She is in no acute distress. VITAL SIGNS:  Her blood pressure is about 100 systolic now.  Pulse 90 and regular.  Respirations are 16.  She is afebrile. HEENT:  Her pupils are reactive.  Nose and throat are clear.  Mucous membranes fairly moist. NECK:   Supple without masses.  She does not have any carotid bruits. CHEST:  Clear without wheezes, rales, or rub. HEART:  Regular without murmur, gallop, or rub. ABDOMEN:  Soft.  Bowel sounds are present and active.  She says she is nauseated this morning. EXTREMITIES:  No definite abnormalities. CENTRAL NERVOUS SYSTEM:  Grossly intact.  LABORATORY DATA:  EKG showed heart rate of 66, sinus rhythm, and inverted T-waves.  CBC showed a white count of 9000, hemoglobin 13.4, and platelets 258.  Her CT head showed no acute intracranial abnormality.  BMET showed BUN 16, creatinine 1.25, and potassium is normal.  Cardiac panel is normal.  ASSESSMENT:  She has hypotension.  She is going to have her blood pressure medications held.  She is going to receive IV fluids and she may be able to go home tomorrow.  She is going to need something for nausea now.     Yves Fodor L. Juanetta Gosling, M.D.     ELH/MEDQ  D:  09/15/2010  T:  09/15/2010  Job:  [id number]  Electronically Signed by Kari Baars M.D. on 09/23/2010 02:36:30 PM

## 2010-09-23 NOTE — Discharge Summary (Signed)
NAME:  ELDA, DUNKERSON              ACCOUNT NO.:  [id number]  MEDICAL RECORD NO.:  [id number]           PATIENT TYPE:  I  LOCATION:  A315                          FACILITY:  [facility]  PHYSICIAN:  Matej Sappenfield L. Juanetta Gosling, M.D.DATE OF BIRTH:  1945-09-19  DATE OF ADMISSION:  09/14/2010 DATE OF DISCHARGE:  LH                              DISCHARGE SUMMARY   FINAL DISCHARGE DIAGNOSES: 1. Weakness with hypotension, possibly related to Addison disease. 2. Coronary artery occlusive disease. 3. Hypertension. 4. Gastroesophageal reflux disease. 5. Hyperlipidemia. 6. Hypothyroidism. 7. History of peptic ulcer disease. 8. Chronic obstructive pulmonary disease. 9. Low morning serum cortisol.  HISTORY:  Ms. Kavanagh is a 65 year old who came to the emergency room because of weakness.  She said that this was more on the left than on the right and she had a little bit of numbness as well.  She did not have any abnormalities of speech.  She did not have any problems with swallowing.  She has not had fever or chills.  When she came to the emergency room, she was noted to be fairly markedly hypotensive and was brought in for IV fluids and stabilization of her blood pressure.  PHYSICAL EXAMINATION:  VITAL SIGNS:  Her blood pressure was running about 100 systolic, pulse was 90 and regular, respirations 16, she is afebrile. HEENT:  Pupils are reactive.  Nose and throat are clear. CHEST:  Clear without wheezes, rales, or rub. HEART:  Regular without murmur, gallop, or rub. CENTRAL NERVOUS SYSTEM:  Grossly intact.  Strength was equal bilaterally.  She did not have any numbness.  White blood count was 9000, hemoglobin 13.4, platelets 258.  CT showed no acute intracranial abnormality.  BUN of 16, creatinine 1.25, and potassium was normal.  She had an 8 a.m. cortisol level that was low. By the next morning, she was much improved.  Her blood pressure was up into the 140s, O2 sat was 95%.  She had no further  neurological symptoms and she is discharged home in improved condition, but she is going to have some changes made in her medications.  She will continue with metoprolol 25 mg daily, Vytorin 10/20 at bedtime, Detrol LA 4 mg daily. She will hold Lasix 20 mg daily for now.  She can continue nitroglycerin 0.4 mg every 5 minutes as needed for chest pain.  She will continue hydrocodone 5/325 q.4 h. p.r.n. pain, aspirin 81 mg daily, Nexium 40 mg b.i.d., Advair 250/50 one puff b.i.d., Synthroid 112 mcg daily.  She has been on quinapril and she will change the dose from 40 mg to 20 mg daily and she will be on Decadron 4 mg daily.  The Decadron was discussed with Dr. Fransico Him who actually admitted her and who is going to see her in consultation in his office for Endocrinology consultation for possible Addison disease with the low blood pressure and the low serum cortisol level.  She will follow up in my office in about 2 weeks.  She will see Dr. Fransico Him in his office.     Rickayla Wieland L. Juanetta Gosling, M.D.     ELH/MEDQ  D:  09/16/2010  T:  09/16/2010  Job:  [id number]  Electronically Signed by Kari Baars M.D. on 09/23/2010 02:36:27 PM

## 2010-10-08 ENCOUNTER — Encounter (HOSPITAL_COMMUNITY): Payer: Medicare Other | Attending: Pulmonary Disease

## 2010-10-08 ENCOUNTER — Other Ambulatory Visit (HOSPITAL_COMMUNITY): Payer: Self-pay | Admitting: Oncology

## 2010-10-08 DIAGNOSIS — E2749 Other adrenocortical insufficiency: Secondary | ICD-10-CM | POA: Insufficient documentation

## 2010-10-09 LAB — ACTH STIMULATION, 3 TIME POINTS: Cortisol, 60 Min: 4.7 ug/dL (ref >20)

## 2010-10-09 NOTE — Assessment & Plan Note (Signed)
[facility]                            CARDIOLOGY OFFICE NOTE   Ann [name], Ann [name]                     MRN:          [id number]  DATE:06/03/2007                            DOB:          [birth year]    Ann [name] returns today for further management of her coronary  disease.   Her biggest complaint is shortness of breath with any activity.  She  does not smoke.   PROBLEM LIST:  1. Coronary disease.  She has had history of normal left ventricular      function with her last Myoview showing no ischemia July 02, 2005.  She has had a previous stent to the left anterior descending      in 2000.  2. Hyperlipidemia followed by Dr. Juanetta Gosling.  3. Hypertension which has been under good control.  4. Obesity.   She denies any orthopnea, PND or peripheral edema.  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  She is afraid she will lose job.   MEDICATIONS:  1. Vytorin 10/20 daily.  2. Hydrochlorothiazide 12.5 daily.  3. Levothyroxine 0.05 mg daily.  4. Amlodipine 5 mg a day.  5. Aspirin 81 mg a day.  6. Protegra daily.  7. Accupril 20 mg daily.  8. Omeprazole 40 mg a day.   PHYSICAL EXAMINATION:  VITAL SIGNS:  Blood pressure is 154/91.  Her  pulse is 61.  Her weight is up 21 pounds to 222.  GENERAL APPEARANCE:  She is in no acute distress.  HEENT:  Unchanged.  NECK:  No JVD.  Carotids are full without bruits.  Thyroid is not  enlarged.  Trachea is midline.  LUNGS:  Clear.  HEART:  A nondisplaced but poorly appreciated PMI.  She has a normal S1-  S2 without gallop.  ABDOMEN:  Obese, organomegaly was difficult to assess.  EXTREMITIES:  Reveal no edema.  Pulses are intact.  NEUROLOGIC:  Exam is intact.   EKG shows normal sinus rhythm, diffuse T-wave inversion in the lateral  as well as the anterolateral leads.  This is unchanged.   ASSESSMENT/PLAN:  Ann [name] dyspnea on exertion could  be ischemic-  related.  She is due a stress Myoview which we will arrange.  Also it  could be related to her weight, which has increased substantially as we  pointed out to her today.   We renewed all of her medications cardiac-wise.  I have advised her to  follow up with Dr. Juanetta Gosling concerning some sinus issues she has.  I will  see her back in six months even if her Myoview is negative.     Thomas C. Daleen Squibb, MD, [facility]  Electronically Signed    TCW/MedQ  DD: 06/03/2007  DT: 06/03/2007  Job #: [id number]   cc:   Ramon Dredge L. Juanetta Gosling, M.D.

## 2010-10-09 NOTE — Discharge Summary (Signed)
NAME:  Ann [name], Ann [name]              ACCOUNT NO.:  [id number]   MEDICAL RECORD NO.:  [id number]          PATIENT TYPE:  INP   LOCATION:  3703                         FACILITY:  [facility]   PHYSICIAN:  Bruce R. Juanda Chance, MD, FACCDATE OF BIRTH:  1946/01/17   DATE OF ADMISSION:  04/13/2008  DATE OF DISCHARGE:  04/14/2008                               DISCHARGE SUMMARY   PRIMARY CARDIOLOGIST:  Maisie Fus C. Daleen Squibb, MD, [facility]   PRIMARY CARE PHYSICIAN:  Edward L. Juanetta Gosling, MD.   DISCHARGE DIAGNOSIS:  Noncardiac chest pain.   SECONDARY DIAGNOSES:  1. Hyperlipidemia.  2. Hypertension.  3. Coronary artery disease.  4. Hypothyroidism.  5. Gastroesophageal reflux disease.  6. Ulcer.   ALLERGIES:  The patient has no allergies.   PROCEDURES PERFORMED:  During this hospitalization, a cardiac  catheterization that showed no significant stenosis or blockage.  The  procedure was performed by Dr. Charlies Constable on April 14, 2008,   EMERGENCY DEPARTMENT COURSE:  On April 12, 2008,  the patient  experienced 8/10 sharp chest pain at her job, which is at a [employer] in [place].  She took a nitroglycerin and had minimal  improvement.  She then went to the Emergency Room at [facility], and was  given four more nitroglycerin with some improvement of her symptoms, was  started on IV nitroglycerin.   By 12 a.m. that evening, her pain had subsided.  She had 2/10 dull chest  pain in the morning of April 13, 2008, and was transferred to [facility] where she was worked up to rule out acute coronary  syndrome.  At that time, she did have the cardiac catheterization that  showed no acute stenosis and was discharged on April 14, 2008.   LABORATORY DATA:  White blood cell 6.3, hemoglobin 12.7, hematocrit  37.6, and platelets 196.  Sodium 135, potassium 3.7, chloride 106,  carbon dioxide 24, BUN 15, creatinine 0.70, and glucose 24.  The patient  had a series of three negative cardiac  enzymes during her hospital stay.  D-dimer has been drawn.  However, results are pending at the time of  this dictation.   FOLLOWUP:  The patient has a followup appointment with Dr. Daleen Squibb on  May 04, 2008, at 10:45 a.m.  Duration of discharge was 45 minutes  including physician time.      Jarrett Ables, PAC      Bruce R. Juanda Chance, MD, [facility]  Electronically Signed    MS/MEDQ  D:  04/14/2008  T:  04/15/2008  Job:  [id number]   cc:   Ramon Dredge L. Juanetta Gosling, M.D.

## 2010-10-09 NOTE — Assessment & Plan Note (Signed)
Ann [name]                            CARDIOLOGY OFFICE NOTE   Ann [name], Ann [name]                     MRN:          [id number]  DATE:05/04/2008                            DOB:          1945-08-11    Ann [name] comes in today for followup.  Other than gaining weight,  she said  she has not changed her diet.  She is doing well.  She does  have some peripheral edema at the end of the day.  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   She has had no angina.  No ischemic symptoms.  She denies any orthopnea  or PND.  She has had no tachy palpitations.   CURRENT MEDICATIONS:  1. Vytorin 10/20 daily.  2. HCTZ 12.5 mg per day.  3. Levothyroxine 0.05 mg per day.  4. Amlodipine 5 mg per day.  5. Aspirin 81 mg a day.  6. Protegra.  7. Flex-A-Min.  8. Accupril 20 mg a day.  9. Nexium 40 mg a day.  10.Detrol LA 4 mg a day.  11.Carry sublingual nitroglycerin.   PHYSICAL EXAMINATION:  VITAL SIGNS:  Her blood pressure was elevated  today at 144/92, her pulse is 80 and regular, which is up.  Her weight  is up from 203-234.  GENERAL:  She is in no acute distress.  SKIN:  Warm and dry.  HEENT:  Unremarkable.  NECK:  No JVD.  Carotids are full without bruits.  Thyroid is not  enlarged.  Trachea is midline.  LUNGS:  Clear to auscultation and percussion.  There is no rales.  HEART:  Poorly appreciated PMI.  Normal S1 and S2.  No gallop.  ABDOMEN:  Obese with good bowel sounds.  Organomegaly is difficult to  assess.  EXTREMITIES:  A 2+ pitting edema pretibially all the way to the ankles.  Pulses are intact.  There is no sign of DVT.  NEURO:  Intact.   ASSESSMENT AND PLAN:  Ann [name] is stable from a cardiac standpoint.  Her last stress Myoview was in January 2009 which showed no ischemia and  ejection fraction of 63%.  Her blood work is being followed by Dr.  Juanetta Gosling.  I am concerned about her weight.  She liked to attribute  this  all to her swelling that I have told her a lot of this is dietary  indiscretion, both in terms of salt and calories.   PLAN:  I have added furosemide 20 mg per day.  I have added potassium 10  mEq a day.  For the time being, we will keep her on hydrochlorothiazide  with her pressure high.  I will see her back in 6 months.     Thomas C. Daleen Squibb, MD, [facility]  Electronically Signed    TCW/MedQ  DD: 05/04/2008  DT: 05/04/2008  Job #: [id number]

## 2010-10-09 NOTE — H&P (Signed)
NAME:  Ann [name], Ann [name]              ACCOUNT NO.:  [id number]   MEDICAL RECORD NO.:  [id number]          PATIENT TYPE:  INP   LOCATION:  3703                         FACILITY:  [facility]   PHYSICIAN:  Bruce R. Juanda Chance, MD, FACCDATE OF BIRTH:  [birth year]   DATE OF ADMISSION:  04/13/2008  DATE OF DISCHARGE:                              HISTORY & PHYSICAL   CHIEF COMPLAINT:  Chest pain.   HISTORY OF PRESENT ILLNESS:  On April 12, 2008, the patient was at  work at [employer].  At approximately 8:30 p.m., she started to  experience sharp 8/10 chest pain that was similar to previous chest pain  she has had.  She had shortness of breath, nausea, diaphoresis, and  dizziness.  She took a nitroglycerin and had minimal improvement to her  symptoms.  Her blood pressure was taken, and she stated it was 190/120.  She subsequently went to the emergency department at [facility]  and was given 4 more nitroglycerin sublingually.  Each time had minimal  decrease in symptoms.  She was started on nitroglycerin drip and pain  was relieved further.  By 12 a.m., her pain had resolved.  This morning  she had a 2/10 dull pain in the same location of her chest, left-sided,  sharp.  She was given nitroglycerin sublingually and the pain subsided.  The pain was nonradiating.  The patient was transferred today April 13, 2008, to [facility] where she was seen by Dr. Karleen Hampshire and  Dr. Charlies Constable.   PAST MEDICAL HISTORY:  The patient has a history of coronary artery  disease with bare-metal stent placement in the left anterior descending  artery in 2000.  She had normal LV function which was 73% ejection  fraction on her Myoview on July 02, 2005.  She also has a history of  hyperlipidemia, hypertension, hypothyroidism, gastroesophageal reflux  disease, and ulcer.   SOCIAL HISTORY:  She currently lives in [place] with a number of  relatives.  She works full time at [employer].  She is  married and has 4 living children.  She does not smoke for the last 30  years, but has 60-pack-year smoking history.  She drinks no alcohol for  the last 30 years.  No drugs and no herbal medications.  She has a  regular diet.  She does not exercise.   FAMILY HISTORY:  Mother deceased at 18 from stroke.  She also had a  history of hypertension.  Father has deceased at 61 from myocardial  infarction.  One brother died from pulmonary embolism and another  brother has coronary artery disease.   REVIEW OF SYSTEMS:  She has had no fevers, chills, sweats, no  significant change in weight, and no lymphadenopathy.  HEAD, EYES, EARS,  NOSE, AND THROAT:  No obvious abnormalities.  Currently has a headache,  mild after receiving nitroglycerin reports this is common.  No vertigo.  No photophobia.  No vision or hearing loss.  SKIN:  No rashes, lesions,  or petechiae.  CARDIOPULMONARY:  She does report dyspnea on exertion,  orthopnea, and a nonproductive cough.  No wheezing and no syncope.  No  palpitations.  No paroxysmal nocturnal dyspnea.  She does report urinary  frequency, but no change over the last several months.  No dysmenorrhea.  No nocturia.  No weakness or numbness.  She has some mild tingling in  the left knee.  She does have a history of arthralgia, especially in  both knees.  No blood per rectum.  No blood in stools.  No diarrhea.  Mild constipation over the last several days.  No heat intolerance or  cold intolerance.  No changes to hair or skin.   PHYSICAL EXAMINATION:  VITAL SIGNS:  Temperature was 97.9 Fahrenheit,  pulse 60, respiration rate 20, and blood pressure was 151/97.  She had  O2 saturation of 98% on 2 L by nasal cannula.  Her weight is 103.7 kg.  GENERAL:  She was alert and oriented x3 and in no apparent distress.  HEENT:  She was normocephalic and atraumatic.  Pupils are equal, round,  and reactive to light and extraocular muscles intact.  No discharge  or  exudates or erythema in the pharynx or nares.  TMs clear.  NECK:  Supple with no lymphadenopathy.  No JVD.  No bruits.  No  thyromegaly.  CARDIOVASCULAR:  Heart with regular rate and rhythm, S1 and S2.  No  murmurs, rubs, or gallops.  Pulses were 2+ and equal in both upper  extremity and lower extremity.  No bruits.  LUNGS:  Clear to auscultation.  SKIN:  No rashes, lesions, or petechiae.  ABDOMEN:  Soft and nontender.  Normal bowel sounds.  No  hepatosplenomegaly.  EXTREMITIES:  No clubbing, cyanosis, or edema and no rashes, lesions, or  petechiae.  MUSCULOSKELETAL:  No obvious deformity.  Strength 5/5 upper extremity  and lower extremity.  NEUROLOGIC:  A and O x3.  Cranial nerves II through XII were grossly  intact.  Normal cerebellar function and normal sensation throughout.   There was a chest x-ray done that showed stable mild cardiomegaly, mild  changes of chronic obstructive pulmonary disease, and chronic  bronchitis.  EKG showed a rate of 53, rhythm of sinus, axis was normal.  PR 168, QRS 70, and QTc 452.  She has evidence of left ventricular  hypertrophy and T-wave inversion throughout most of the leads inferior,  lateral, and chest leads.   MEDICATIONS:  She is on following medications,  1. Aspirin 81 mg by mouth daily.  2. Amlodipine 5 mg by mouth daily.  3. Hydrochlorothiazide 12.5 mg by mouth daily.  4. Quinapril 20 mg by mouth daily.  5. Ezetimibe 10 mg by mouth daily.  6. Levothyroxine 0.05 mg by mouth daily.  7. Nexium 40 mg by mouth as needed.  8. Nitroglycerin 0.4 mg sublingual as needed.  9. Detrol 4 mg by mouth daily.  10.Simvastatin 20 mg by mouth daily.   LABORATORY DATA:  White blood cells 8.0, hemoglobin 12.7, hematocrit  37.2, and platelets 212.  Sodium 134, potassium 3.4, chloride 104,  carbon dioxide 26, BUN 15, creatinine 0.85, and glucose 111.  CK-MB 3.1  and then 3.2 and troponin I less than 0.05.  Calcium 8.8.  PTT 29, PT  12.7, and INR  0.9.   ASSESSMENT AND PLAN:  A 65 year old female with a history of coronary  artery disease and bare-metal stent placement in the left anterior  descending in 2000 and nonobstructive coronary artery disease, her cath  in 2002, presents with prolonged chest  pain similar to previous chest  pain with ECGs with T-wave inversion and negative troponins.  Diagnosis,  probable acute coronary syndrome.   PLAN:  Start IV heparin with Lopressor 12.5 mg b.i.d. and probable  cardiac catheterization tomorrow April 14, 2008.      Jarrett Ables, PAC      Bruce R. Juanda Chance, MD, [facility]  Electronically Signed    MS/MEDQ  D:  04/13/2008  T:  04/14/2008  Job:  [id number]

## 2010-10-09 NOTE — H&P (Signed)
NAME:  Ann [name], Ann [name]              ACCOUNT NO.:  [id number]   MEDICAL RECORD NO.:  [id number]          PATIENT TYPE:  INP   LOCATION:  A301                          FACILITY:  [facility]   PHYSICIAN:  Tesfaye D. Felecia Shelling, MD   DATE OF BIRTH:  Jan 28, 1946   DATE OF ADMISSION:  04/12/2008  DATE OF DISCHARGE:  11/18/2009LH                              HISTORY & PHYSICAL   CHIEF COMPLAINT:  Left-sided chest pain.   HISTORY OF PRESENT ILLNESS:  This is a 65 years old female patient with  history of multiple medical illnesses including coronary artery disease  who came to emergency room with above complaint.  The patient was in her  usual state of health when she started having chest pain on her left  breast area.  The pain was persistent and deep-seated.  There was no  nausea, vomiting, or diaphoresis.  She was brought to emergency room  where the patient was evaluated.  Her 2 sets of cardiac enzymes were  negative.  Her EKG showed diffuse T-wave abnormality, which was also  present in her previous EKG.  [facility] was consulted, and the  patient was planned to be transferred to [facility].  However,  telemetry bed was not available.  The patient was admitted in the  meanwhile here and she will be evaluated by Cardiology in a.m.   REVIEW OF SYSTEMS:  No headache, fever, chills, cough, shortness of  breath, nausea, vomiting, abdominal pain, dysuria, urgency, or frequency  of urination.   PAST MEDICAL HISTORY:  1. Coronary artery disease.  2. Hypertension.  3. Hyperlipidemia.  4. Morbid obesity.   CURRENT MEDICATIONS:  1. Aspirin 81 mg daily.  2. Amlodipine 5 mg daily.  3. Hydrochlorothiazide 12.5 mg daily.  4. Quinapril 20 mg daily.  5. Vytorin 10/20 one tablet daily.  6. Levothyroxine 0.05 mg daily.  7. Nexium 40 mg daily.  8. Nitroglycerin 0.4 p.r.n.  9. Detrol 5 mg daily.   SOCIAL HISTORY:  The patient lives at home.  She has no history of  alcohol, tobacco, or  substance abuse.   PHYSICAL EXAMINATION:  GENERAL:  The patient is alert, awake, and  comfortable.  VITAL SIGNS:  Blood pressure 151/97, pulse 80, respiratory rate 20,  temperature 97.9 degrees Fahrenheit.  HEENT:  Pupils are equal and reactive.  NECK:  Supple.  CHEST:  Clear lung fields.  Good air entry.  CARDIOVASCULAR:  First and second heart sounds heard.  No murmur, no  gallop.  ABDOMEN:  Soft and lax.  Bowel sounds positive.  No mass or  organomegaly.  EXTREMITIES:  No leg edema.   LABS ON ADMISSION:  CPK total 119, CK-MB 3.6, troponin 0.03.  BMP,  sodium 134, potassium 3.4, chloride 104, carbon dioxide 20, glucose 111,  BUN 15, creatinine 0.8, calcium 8.8.  CBC, WBC 8.0, hemoglobin 12.7,  hematocrit 37.2, platelet 212.   ASSESSMENT:  This is a 65 years old female patient with history of  multiple medical illnesses including coronary artery disease who came  with history of left-sided chest pain.  The patient has normal  sets of  cardiac enzymes.  However, her EKG has a diffuse T-wave abnormality,  which is persistent from previous EKG.  The patient has multiple medical  risks for coronary artery disease.  Possibility of acute coronary  syndrome has to be considered.   PLAN:  We will admit the patient under telemetry.  Continue serial EKG,  cardiac enzymes.  We will do cardiology consult.  Continue her regular  medications.      Tesfaye D. Felecia Shelling, MD  Electronically Signed     TDF/MEDQ  D:  04/13/2008  T:  04/13/2008  Job:  [id number]

## 2010-10-09 NOTE — Assessment & Plan Note (Signed)
[facility]                       [place] CARDIOLOGY OFFICE NOTE   ANNASTON, UPHAM                     MRN:          [id number]  DATE:11/24/2008                            DOB:          1945-10-09    Ms. Colantonio comes in today for followup of coronary artery disease and  hypertension.   Her biggest complaint has been lower extremity edema.  She is still on  furosemide 20 mg per day and was given some sort of fluid pill, but ran  out few days ago.  I have no documentation.   She is having no angina or ischemic symptoms.  She denies any orthopnea  or PND.  She has had no palpitations and no syncope.   A stress Myoview in January 2009 showed normal left ventricular function  with an EF of 63%.  She had no ischemia.   MEDICATIONS:  1. Vytorin 10/20 nightly.  2. HCTZ 12.5 mg per daily.  3. Levothyroxine 0.11 mg per day.  4. Amlodipine 5 mg per day.  5. Aspirin 81 mg a day.  6. Protegra daily.  7. Fliximab 1 b.i.d.  8. Accupril 20 mg per day.  9. Nexium 40 mg p.o. b.i.d.  10.Detrol LA 4 mg per day.  11.Furosemide 20 mg q.a.m.  12.Potassium 10 mEq a day.  13.Sublingual nitroglycerin p.r.n.   PHYSICAL EXAMINATION:  VITAL SIGNS:  Her blood pressures are 148/102 in  the left arm and 146/97 in the right arm.  Her pulse is 93 and regular.  She weighs 248.  GENERAL:  She is in no acute distress.  HEENT:  Normal.  She has dentitions in poor condition with multiple  missing teeth.  NECK:  Supple.  Carotid upstrokes were equal bilateral bruits.  No JVD.  Thyroid is not enlarged.  Trachea is midline.  CHEST:  Lungs are clear to auscultation and percussion.  HEART:  A poorly appreciated PMI.  Normal S1 and S2.  No S4 gallop.  ABDOMEN:  Soft, good bowel sounds.  There is no obvious organomegaly.  EXTREMITIES:  No cyanosis, clubbing and there is 1+ pretibial edema.  Pulses are intact.  There is no sign of DVT.  NEUROLOGIC:  Intact.  SKIN:   Unremarkable.  MUSCULOSKELETAL:  Chronic arthritic changes.   ASSESSMENT AND PLAN:  Ms. Jakubowicz is stable from a cardiac perspective.  However, blood pressure is not under good control.  She also has  significant problems or peripheral edema.   PLAN:  1. Increase furosemide to 40 mg p.o. q.a.m.  2. Increase potassium to 20 mEq a day.  3. Discontinue amlodipine.  4. Increase Accupril to 40 mg q.a.m.  5. Begin metoprolol succinate 25 mg q.a.m.   We will have her return next Friday, December 02, 2008, for blood pressure  check and a BMET.  She may have to have up titration of beta-blocker.     Thomas C. Daleen Squibb, MD, [facility]  Electronically Signed    TCW/MedQ  DD: 11/24/2008  DT: 11/25/2008  Job #: [id number]   cc:   Ramon Dredge L. Juanetta Gosling, M.D.

## 2010-10-10 ENCOUNTER — Inpatient Hospital Stay (HOSPITAL_COMMUNITY)
Admission: EM | Admit: 2010-10-10 | Discharge: 2010-10-12 | DRG: 287 | Disposition: A | Discharge status: Home / Self Care | Payer: Medicare Other | Source: Other Acute Inpatient Hospital | Attending: Cardiology | Admitting: Cardiology

## 2010-10-10 ENCOUNTER — Emergency Department (HOSPITAL_COMMUNITY): Payer: Medicare Other

## 2010-10-10 ENCOUNTER — Emergency Department (HOSPITAL_COMMUNITY)
Admission: EM | Admit: 2010-10-10 | Discharge: 2010-10-10 | Disposition: A | Discharge status: Nursing Home (Includes ICF) | Payer: Medicare Other | Attending: Emergency Medicine | Admitting: Emergency Medicine

## 2010-10-10 DIAGNOSIS — E2749 Other adrenocortical insufficiency: Secondary | ICD-10-CM | POA: Diagnosis present

## 2010-10-10 DIAGNOSIS — Z79899 Other long term (current) drug therapy: Secondary | ICD-10-CM

## 2010-10-10 DIAGNOSIS — R0789 Other chest pain: Principal | ICD-10-CM | POA: Diagnosis present

## 2010-10-10 DIAGNOSIS — E039 Hypothyroidism, unspecified: Secondary | ICD-10-CM | POA: Diagnosis present

## 2010-10-10 DIAGNOSIS — T82897A Other specified complication of cardiac prosthetic devices, implants and grafts, initial encounter: Secondary | ICD-10-CM | POA: Diagnosis present

## 2010-10-10 DIAGNOSIS — E785 Hyperlipidemia, unspecified: Secondary | ICD-10-CM | POA: Diagnosis present

## 2010-10-10 DIAGNOSIS — K219 Gastro-esophageal reflux disease without esophagitis: Secondary | ICD-10-CM | POA: Insufficient documentation

## 2010-10-10 DIAGNOSIS — I10 Essential (primary) hypertension: Secondary | ICD-10-CM | POA: Diagnosis present

## 2010-10-10 DIAGNOSIS — Z87891 Personal history of nicotine dependence: Secondary | ICD-10-CM

## 2010-10-10 DIAGNOSIS — Y849 Medical procedure, unspecified as the cause of abnormal reaction of the patient, or of later complication, without mention of misadventure at the time of the procedure: Secondary | ICD-10-CM | POA: Diagnosis present

## 2010-10-10 DIAGNOSIS — I200 Unstable angina: Secondary | ICD-10-CM | POA: Insufficient documentation

## 2010-10-10 DIAGNOSIS — R0989 Other specified symptoms and signs involving the circulatory and respiratory systems: Secondary | ICD-10-CM | POA: Insufficient documentation

## 2010-10-10 DIAGNOSIS — R0609 Other forms of dyspnea: Secondary | ICD-10-CM | POA: Insufficient documentation

## 2010-10-10 DIAGNOSIS — J449 Chronic obstructive pulmonary disease, unspecified: Secondary | ICD-10-CM | POA: Diagnosis present

## 2010-10-10 DIAGNOSIS — J4489 Other specified chronic obstructive pulmonary disease: Secondary | ICD-10-CM | POA: Diagnosis present

## 2010-10-10 DIAGNOSIS — Z8711 Personal history of peptic ulcer disease: Secondary | ICD-10-CM | POA: Insufficient documentation

## 2010-10-10 DIAGNOSIS — R079 Chest pain, unspecified: Secondary | ICD-10-CM

## 2010-10-10 DIAGNOSIS — E669 Obesity, unspecified: Secondary | ICD-10-CM | POA: Diagnosis present

## 2010-10-10 DIAGNOSIS — E7800 Pure hypercholesterolemia, unspecified: Secondary | ICD-10-CM | POA: Insufficient documentation

## 2010-10-10 DIAGNOSIS — IMO0002 Reserved for concepts with insufficient information to code with codable children: Secondary | ICD-10-CM

## 2010-10-10 DIAGNOSIS — I2510 Atherosclerotic heart disease of native coronary artery without angina pectoris: Secondary | ICD-10-CM | POA: Insufficient documentation

## 2010-10-10 DIAGNOSIS — Z7982 Long term (current) use of aspirin: Secondary | ICD-10-CM

## 2010-10-10 LAB — BASIC METABOLIC PANEL
BUN: 30 mg/dL — ABNORMAL HIGH (ref 6–23)
CO2: 28 mEq/L (ref 19–32)
Chloride: 104 mEq/L (ref 96–112)
GFR calc non Af Amer: 48 mL/min — ABNORMAL LOW (ref >60)
Glucose, Bld: 182 mg/dL — ABNORMAL HIGH (ref 70–99)
Potassium: 5.4 mEq/L — ABNORMAL HIGH (ref 3.5–5.1)
Sodium: 139 mEq/L (ref 135–145)

## 2010-10-10 LAB — CBC
HCT: 39.1 % (ref 36.0–46.0)
MCH: 29 pg (ref 26.0–34.0)
MCHC: 31.7 g/dL (ref 30.0–36.0)
MCV: 91.4 fL (ref 78.0–100.0)
Platelets: 205 10*3/uL (ref 150–400)
RDW: 15 % (ref 11.5–15.5)

## 2010-10-10 LAB — DIFFERENTIAL
Eosinophils Absolute: 0 10*3/uL (ref 0.0–0.7)
Eosinophils Relative: 0 % (ref 0–5)
Lymphocytes Relative: 11 % — ABNORMAL LOW (ref 12–46)
Lymphs Abs: 1 10*3/uL (ref 0.7–4.0)
Monocytes Absolute: 0.4 10*3/uL (ref 0.1–1.0)
Monocytes Relative: 5 % (ref 3–12)

## 2010-10-10 LAB — CARDIAC PANEL(CRET KIN+CKTOT+MB+TROPI)
CK, MB: 4.3 ng/mL — ABNORMAL HIGH (ref 0.3–4.0)
Relative Index: INVALID (ref 0.0–2.5)
Total CK: 58 U/L (ref 7–177)

## 2010-10-10 LAB — HEPARIN LEVEL (UNFRACTIONATED): Heparin Unfractionated: 0.32 IU/mL (ref 0.30–0.70)

## 2010-10-10 LAB — POCT CARDIAC MARKERS
CKMB, poc: 2.1 ng/mL (ref 1.0–8.0)
Myoglobin, poc: 43.4 ng/mL (ref 12–200)

## 2010-10-11 DIAGNOSIS — I2510 Atherosclerotic heart disease of native coronary artery without angina pectoris: Secondary | ICD-10-CM

## 2010-10-11 LAB — BASIC METABOLIC PANEL
BUN: 28 mg/dL — ABNORMAL HIGH (ref 6–23)
BUN: 28 mg/dL — ABNORMAL HIGH (ref 6–23)
CO2: 25 mEq/L (ref 19–32)
Calcium: 9.2 mg/dL (ref 8.4–10.5)
Chloride: 104 mEq/L (ref 96–112)
GFR calc non Af Amer: 51 mL/min — ABNORMAL LOW (ref >60)
GFR calc non Af Amer: 59 mL/min — ABNORMAL LOW (ref >60)
Glucose, Bld: 101 mg/dL — ABNORMAL HIGH (ref 70–99)
Potassium: 5 mEq/L (ref 3.5–5.1)
Potassium: 5 mEq/L (ref 3.5–5.1)
Sodium: 137 mEq/L (ref 135–145)
Sodium: 137 mEq/L (ref 135–145)

## 2010-10-11 LAB — CBC
MCHC: 33.5 g/dL (ref 30.0–36.0)
MCV: 89.2 fL (ref 78.0–100.0)
Platelets: 221 10*3/uL (ref 150–400)
RDW: 15.2 % (ref 11.5–15.5)
WBC: 11.7 10*3/uL — ABNORMAL HIGH (ref 4.0–10.5)

## 2010-10-11 LAB — PROTIME-INR: Prothrombin Time: 12.5 seconds (ref 11.6–15.2)

## 2010-10-11 LAB — CARDIAC PANEL(CRET KIN+CKTOT+MB+TROPI): Total CK: 54 U/L (ref 7–177)

## 2010-10-12 NOTE — Op Note (Signed)
[place]. [facility]  Patient:    Ann [name], Ann [name]                       MRN: [id number] Proc. Date: 12/12/00 Adm. Date:  [id number] Attending:  Cornell Barman                           Operative Report  PREOPERATIVE DIAGNOSIS:  Loose body, left knee, possible other internal derangement.  POSTOPERATIVE DIAGNOSES: 1. Loose body, left knee. 2. Degenerative tear, posterior lateral meniscus, left knee. 3. Chondromalacia, left patella, superior shelf.  PROCEDURES: 1. Arthroscopy. 2. Removal of loose body. 3. Partial lateral meniscectomy. 4. Chondroplasty of patella.  SURGEON:  Lenard Galloway. Chaney Malling, M.D.  ANESTHESIA:  MAC.  OPERATIVE FINDINGS:  With the arthroscope in the knee, a very careful examination was undertaken.  There was a cartilaginous, bony-type loose body in the medial compartment overlying the posterior horn of the medial meniscus. The entire medial meniscus itself appeared normal.  No tears were seen in the meniscus.  The articular cartilage of the medial femoral condyle and medial tibial plateau were normal through the arthroscope.  In the intercondylar notch, the ACL was intact.  In the medial compartment was a degenerative tear of the posterior attachment of the lateral meniscus and a degenerative tear of the leading edge of the mid-third of the lateral meniscus.  In the patellofemoral junction area, there was normal articular cartilage in the intercalary area, but there was some severe chondromalacia of the lateral patellar facet and there was a large superior shelf plica.  DESCRIPTION OF PROCEDURE:  The patient placed on the operating table in a supine position and a pneumatic tourniquet about the left upper thigh.  The left leg was placed in a leg holder and the entire left lower extremity prepped with Duraprep and draped out in the usual manner.  Marcaine had been placed in the _____, and then Xylocaine and epinephrine were  used to infiltrate the puncture wounds.  An infusion cannula was placed in the superior medial pouch and the knee distended with saline.  Anterior medial and anterior lateral portals were made, and the arthroscope was introduced.  The loose body was seen overlying the posterior horn of the medial meniscus.  The intra-articular shaver was introduced, and this was debrided and the loose body removed.  Attention was then turned to the lateral compartment.  There were degenerative tears of the lateral meniscus, and this was debrided through both portals with a series of baskets.  This was followed up with the intra-articular shaver and the remainder of the rim was smoothed and balanced with a nice transition anterior and posteriorly.  A good, stable, functional rim was still present.  The arthroscope was then passed into the femoral notch area, and there was a significant chondromalacia of the patella.  Through the opposite portal, the intra-articular shaver was introduced, and the lateral patellar facet was debrided aggressively.  There was a large superior shelf plica, which was debrided with baskets followed with the intra-articular shaver.  There was also a small medial plica, which was debrided with the intra-articular shaver.  The knee was then infiltrated with Marcaine and a large bulky compressive dressing applied.  The patient was then turned to the recovery room in excellent condition.  Technically this procedure went extremely well.  FOLLOW-UP CARE: 1. Seen in the office in one week. 2. Percodan for pain.  DD:  12/12/00 TD:  12/13/00 Job: [id number]

## 2010-10-12 NOTE — Procedures (Signed)
NAME:  Ann [name], Ann [name]              ACCOUNT NO.:  [id number]   MEDICAL RECORD NO.:  [id number]          PATIENT TYPE:  OUT   LOCATION:  RESP                          FACILITY:  [facility]   PHYSICIAN:  Edward L. Juanetta Gosling, M.D.DATE OF BIRTH:  08-17-1945   DATE OF PROCEDURE:  DATE OF DISCHARGE:  05/24/2008                            PULMONARY FUNCTION TEST   1. Spirometry shows no ventilatory defect, but the evidence of airflow      obstruction most marked in the smaller airways.  2. Lung volumes are normal.  3. DLCO is mildly reduced.      Edward L. Juanetta Gosling, M.D.  Electronically Signed     ELH/MEDQ  D:  05/26/2008  T:  05/26/2008  Job:  [id number]

## 2010-10-12 NOTE — Assessment & Plan Note (Signed)
[facility]                            CARDIOLOGY OFFICE NOTE   Ann [name], Ann [name]                     MRN:          [id number]  DATE:06/10/2006                            DOB:          1945/06/05    Ann [name] returns today for management of the following issues:  1. Coronary artery disease status post stenting of the LAD in 2000.      Her last Cardiolite was July 02, 2005 with EF of 73% with no      ischemia.  She has no symptoms of angina or ischemia.  She has      normal left ventricular function.  2. Hyperlipidemia.  This has been followed by Dr. Juanetta [name], but she has      not had her blood drawn in some time.  She is on Vytorin.  3. Hypertension under good control.   MEDICATIONS:  1. Vytorin 10/20 daily.  2. Hydrochlorothiazide 12.5 daily.  3. Levothyroxine 0.05 mg daily.  4. Amlodipine 5 mg daily.  5. Aspirin 81 mg daily.  6. __________ daily.  7. __________ 1 b.i.d.  8. Accupril 20 mg daily.  9. Nexium 40 mg daily.   Her blood pressure today is 157/107.  Her pulse is 67 and regular.  Her  weight is 201.  HEENT:  Normocephalic, atraumatic.  PERRLA, extraocular is intact.  Sclerae clear.  Facial asymmetry is normal.  Carotid upstrokes are equal bilaterally without bruits.  There is no  JVD.  Thyroid is not enlarged.  Trachea is midline.  LUNGS:  Are clear.  HEART:  Reveals a regular rate and rhythm with nondisplaced PMI.  There  is no S4 gallop.  ABDOMEN:  Is soft with good bowel sounds.  EXTREMITIES:  There is no cyanosis, clubbing or edema.  Pulses are  present.  NEURO:  Intact.  MUSCULOSKELETAL:  Is intact.  SKIN:  Is intact.   EKG shows sinus brady with left ventricular hypertrophy with  polarization changes.  This is unchanged from previous ECG.   ASSESSMENT/PLAN:  Ann [name] is stable from a cardiovascular  standpoint.  She would like her blood work checked which we will do with  a CMP and lipid panel.  She did  eat early this morning, but I  really  wanted to see what her LDL and HDL are.  Her blood pressure is high and  I suggest we increase her amlodipine from 5 to 10 mg per day.  In  addition, I have written her a prescription for generic Nexium in the  form of omeprazole 20 mg p.o. q. a.m.   I will plan on seeing her back in a year.  At that time, she will need a  stress Myoview.     Thomas C. Daleen Squibb, MD, [facility]  Electronically Signed    TCW/MedQ  DD: 06/10/2006  DT: 06/10/2006  Job #: [id number]   cc:   Ramon Dredge L. Ann [name], M.D.

## 2010-10-12 NOTE — Discharge Summary (Signed)
[place]. [facility]  Patient:    Ann [name], Ann [name]                       MRN: [id number] Adm. Date:  [id number] Disc. Date: [id number] Attending:  Lenoria Farrier Dictator:   Brita Romp, P.A. CC:         Jesse Sans. Wall, M.D. [facility]  [facility], [place], [place]   Discharge Summary  DISCHARGE DIAGNOSES: 1. Chest pain, status post cardiac catheterization, no source of ischemia. 2. Hypertension. 3. Hyperlipidemia. 4. Hypothyroidism. 5. Gastroesophageal reflux disease.  HOSPITAL COURSE:  The patient was initially admitted via day surgery for an outpatient cardiac catheterization.  She was taken to the cath lab by Bruce R. Juanda Chance, M.D.  He noted a 30% stenosis in the existing stent in the LAD. Otherwise, coronary arteries were normal.  Left ventricle was normal with an ejection fraction approximately 60%.  On follow-up that evening, Dr. Juanda Chance noticed that the patients blood pressure had trended down into the 80s.  He ordered that her Norvasc and other hypertensive medicines be held and that she be placed on intravenous fluids overnight.  The following morning, the patient was seen by Veneda Melter, M.D.  Noted blood pressure of 105/72.  Her H&H was stable at 10.4 and 30.4.  Felt she was stable for discharge.  DISCHARGE MEDICATIONS: 1. Norvasc 5 mg q.d.  NOTE:  This is to be held until her next appointment. 2. Accupril 20 mg q.d.  Note:  This is also to be held until her next    appointment. 3. Lipitor 10 mg q.p.m. 4. Protegra once q.d. 5. Synthroid 0.05 mg q.d. 6. Enteric coated aspirin 325 mg q.d. 7. Nexium 40 mg q.d.  DISCHARGE INSTRUCTIONS: 1. The patient was advised to avoid driving, heavy lifting or tub baths for 48    hours. 2. She is to eat a low fat, low cholesterol diet. 3. She is to watch for pain, bleeding, or swelling of the cath site and to    call the office for any of these problems. 4. She is to follow up with  __________ at the [place], [facility],    office on Oct 06, 2000, at 11 in the morning. 5. She is to follow up with the free clinic in [place], [place],    __________ scheduled.  LAB VALUES:  White count 9.1, hemoglobin 10.4, hematocrit 30.4, platelets 224. DD:  09/19/00 TD:  09/22/00 Job: [id number]

## 2010-10-12 NOTE — Cardiovascular Report (Signed)
[place]. [facility]  Patient:    [name], Ann [name]                       MRN: [id number] Proc. Date: 07/13/99 Adm. Date:  [id number] Attending:  Alric Quan CC:         Patrica Duel, M.D.             Thomas C. Wall, M.D. [facility]             Cardiac Catheterization Laboratory                        Cardiac Catheterization  PROCEDURES PERFORMED:  Left heart catheterization with coronary angiography and  left ventriculography.  INDICATIONS:  [name] is a 65 year old woman who had previously undergone stent placement in the proximal LAD by Dr. Juanda Chance.  She presented with symptoms of intermittent chest pain and bradycardia.  On arrival to the cardiac catheterization laboratory, she was actually in third degree heart block with a junctional escape rhythm in the 40s and a stable blood pressure.  We opted to proceed with urgent  catheterization.  DESCRIPTION OF PROCEDURE:  A 6-French sheath was placed in the right femoral artery and a 6-French sheath was placed in the right femoral vein.  We used standard Judkins 6-French catheters for the left heart catheterization.  Contrast was Omnipaque.  There were no complications.  RESULTS:  HEMODYNAMICS:  Left ventricular pressure 120/18, aortic pressure 114/64.  There was no aortic valve gradient.  LEFT VENTRICULOGRAM:  Wall motion was normal.  Ejection fraction was estimated t greater than 65%.  CORONARY ARTERIOGRAPHY:  Right dominant system, however, the right coronary is relatively small and the left circumflex is a very large coronary, supplying most of the posterolateral wall.  1. The left main is normal.  2. The left anterior descending artery has a 25% stenosis in the proximal vessel,    followed by a 50% stenosis in the proximal vessel within the stented site, which    is at the origin of the first septal perforator.  The septal perforator itself    has an 80% stenosis at its  origin.  There is a small diagonal arising from the    mid LAD.  The mid to distal LAD has only minor irregularities.  3. The left circumflex is a very large vessel, giving rise to a small OM-1, small    OM-2, and a very large branching OM-3.  There is a 20% stenosis in the mid    circumflex.  4. The right coronary artery is a relatively small vessel which ends as a small  posterior descending artery.  It is free of angiographic disease.  IMPRESSIONS: 1. Normal left ventricular systolic function. 2. Moderate nonobstructive renarrowing within the stent in the proximal left    anterior descending artery.  Otherwise, patent coronary arteries.  PLAN:  The patient will be observed in the TCU.  As she is hemodynamically stable at this point, we will not place a temporary pacemaker, but we will watch her closely.  Should she have symptomatic bradycardia, then we may need to proceed ith pacemaker placement. DD:  07/13/99 TD:  07/14/99 Job: [id number]

## 2010-10-12 NOTE — Assessment & Plan Note (Signed)
[facility]                         GASTROENTEROLOGY OFFICE NOTE   JAYLENA, HOLLOWAY                     MRN:          [id number]  DATE:06/18/2006                            DOB:          Feb 12, 1946    [name] returns for followup today.  She had constipation, gas,  and abdominal pain following her colonoscopy.  Abdominal films and blood  work were unremarkable on January 20.  She subsequently underwent a CT  scan of the abdomen and pelvis on January 21 which showed no acute  findings.  A prior cholecystectomy was noted as well as degenerative  changes in the sacroiliac joints.  She still has some minimal problems  with gas and bloating but her constipation and abdominal pain have  resolved.  She states that she has always had a difficult time passing  gas and following the procedure.  She did feel quite distended and could  not pass flatus.  Her polyp pathology was reviewed with her showing  benign polyps.   CURRENT MEDICATIONS:  Listed on the chart and have been reviewed.   MEDICATION ALLERGIES:  NIASPAN.   EXAM:  No acute distress.  Weight 203 pounds.  Blood pressure 130/84.  Pulse 60 and regular.  CHEST:  Clear to auscultation bilaterally.  CARDIAC:  Regular rate and rhythm without murmurs.  ABDOMEN:  Soft and nontender with normoactive bowel sounds.  No palpable  organomegaly, masses, or hernias.   ASSESSMENT AND PLAN:  1. Constipation and gas distention following colonoscopy.  This      problem has resolved.  She is to maintain a high-fiber diet with      adequate fluid intake.  May use Levsin 1-2 sublingual p.r.n., GAS-X      q.i.d. p.r.n., and Milk of Magnesia p.r.n.  2. Adenomatous colon polyp.  Recall colonoscopy recommended for      January 2013.     Venita Lick. Russella Dar, MD, [facility]  Electronically Signed    MTS/MedQ  DD: 06/23/2006  DT: 06/23/2006  Job #: [id number]   cc:   Ramon Dredge L. Juanetta Gosling, M.D.

## 2010-10-12 NOTE — Cardiovascular Report (Signed)
[place]. [facility]  Patient:    TIARNA, KOPPEN                       MRN: [id number] Proc. Date: 09/18/00 Adm. Date:  [id number] Attending:  Lenoria Farrier CC:         Jonell Cluck, M.D.  Thomas C. Wall, M.D. [facility]  Cardiopulmonary Laboratory   Cardiac Catheterization  PROCEDURES PERFORMED:  Cardiac catheterization.  CLINICAL HISTORY:  Ms. Inabinet is 65 years old and had a stent placed to her LAD in April of 2000.  She recently developed symptoms of some chest tightness, not related to exertion.  She was seen by Dr. Daleen Squibb and he felt the best way to evaluate her problem was with catheterization.  DESCRIPTION OF PROCEDURE:  The procedure was initially attempted via the right femoral artery but we were unable to access the right femoral artery.  We used a Doppler needle and finally got into an artery, but on passage of the wire it would not pass very far.  We elected to place a sheath part way up and we got blood return but with very little pressure.  We elected to leave the sheath in place and went to the other side.  A distal aortogram was performed at the end of the procedure and this documented that the sheath was outside the arterial lumen.  She did have some right lower quadrant pain while pulling the sheath on the right side and further observation and possible evaluation as planned.  RESULTS:  The left main coronary artery:  The left main coronary artery was free of significant disease.  Left anterior descending:  The left anterior descending artery gave rise to two septal perforators and two diagonal branches.  There was 30% narrowing at the stent site in the proximal LAD.  Circumflex artery:  The circumflex artery gave rise to an atrial branch, a marginal branch, and four posterolateral branches.  These vessels were free of significant disease.  Right coronary artery:  The right coronary is a moderate sized vessel that gave rise  to a right ventricular branch and a short posterior descending and a short posterolateral branch.  These vessels were free of significant disease.  LEFT VENTRICULOGRAPHY:  The left ventriculogram performed in the RAO projection shows good wall motion with no areas of hypokinesis.  The estimated ejection fraction was 60%.  The aortic pressure was 140/60.  Left ventricular pressure 140 with an end-diastolic pressure of 20.  CONCLUSIONS:  Coronary artery disease, status post stenting of the left anterior descending April 2000 with 30% narrowing at the stent site in the proximal left anterior descending, no significant obstruction in the circumflex or the right coronary with a normal left ventricular function.  RECOMMENDATIONS:  There is no source of ischemia.  I doubt the patients recent symptoms are cardiac.  We will plan reassurance and plan to discharge her as an outpatient today.  I will ask her to follow up with Dr. Nobie Putnam. DD:  09/18/00 TD:  09/19/00 Job: [id number]

## 2010-10-16 NOTE — Discharge Summary (Addendum)
NAME:  Ann [name], Ann [name]              ACCOUNT NO.:  [id number]  MEDICAL RECORD NO.:  [id number]           PATIENT TYPE:  I  LOCATION:  3739                         FACILITY:  [facility]  PHYSICIAN:  Thomas C. Wall, MD, FACCDATE OF BIRTH:  Apr 28, 1946  DATE OF ADMISSION:  10/10/2010 DATE OF DISCHARGE:  10/12/2010                              DISCHARGE SUMMARY   DISCHARGE DIAGNOSES: 1. Chest pain, with heart catheterization Oct 11, 2010 demonstrating     minor coronary artery irregularities and slight nonobstructing in-     stent restenosis of left anterior descending felt stable anatomy,     for medical therapy.     a.     Ruled out for myocardial infarction with enzymes negative      x1. 2. Coronary artery disease status post bare-metal stent placed to the     left anterior descending in 2000. 3. Hypertension. 4. Hyperlipidemia. 5. Obesity. 6. Gastroesophageal reflux disease. 7. Hypothyroidism. 8. Peptic ulcer disease. 9. Chronic obstructive pulmonary disease. 10.Crohn's disease. 11.Possible Addison disease, currently being evaluated by Dr. Fransico Him at     [place]. 12.Status post cholecystectomy. 13.Hysterectomy. 14.Status post tubal ligation.  HOSPITAL COURSE:  [name] is a 65 year old female with a history of known CAD, hypertension, hyperlipidemia who was most recently discharged from [facility] with weakness and hypotension felt possibly related to Addison's disease and is currently undergoing further workup by Dr. Fransico Him.  She has been subsequently placed on 4 mg daily and had been doing well up until day of admission.  She had episodes of chest pressure associated with shortness of breath, and took nitroglycerin but it did not completely resolve it.  She states it was similar to her prior angina, although not quite as intense.  Her EKG showed T-wave inversions similar to prior tracing in 09-26-22, although more pronounced in the inferior leads.  Point-of-care  markers were negative x2.  The patient was transferred to [facility] from [facility] for possible cardiac catheterization, placed on IV nitro and heparin.  She was evaluated by cardiology __________ at that time and felt to be pain free, noting that housework did not bring on chest pain.  Her potassium was mildly elevated at 5.4, and apparently she was on Tekturna at some point and this was discontinued.  She went for heart catheterization Oct 11, 2010 by Dr. Elease Hashimoto showing minor coronary artery irregularities with slight in-stent restenosis of the LAD, which was present on her prior heart catheterization in 2009, felt to be stable anatomy.  Dr. Elease Hashimoto felt she would be a candidate for discharge.  The patient did well overnight post procedurally.  Dr. Daleen Squibb has seen and examined her today and feels she is stable for discharge.  DISCHARGE LABS:  WBC 11.7, hemoglobin 12.5, hematocrit 37.3, platelet count 221,000.  Sodium 137, potassium 5, chloride 104, CO2 25, glucose 101, BUN 28, creatinine 0.95.  Cardiac enzymes negative x4.  STUDIES: 1. Chest x-ray Oct 10, 2010 showed enlargement of cardiac silhouette     with minimal left base atelectasis. 2. Cardiac catheterization Oct 11, 2010, please see full report for  details.  DISCHARGE MEDICATIONS: 1. Advair Diskus 250/50 mg 1 puff inhaled b.i.d. 2. Aspirin 81 mg daily. 3. Decadron 4 mg daily. 4. Detrol LA 4 mg daily. 5. Levothroid 112 mcg daily. 6. Toprol-XL 25 mg daily. 7. Nexium 40 mg b.i.d. 8. Nitro sublingual 0.4 mg every 5 minutes as needed up to 3 doses for     chest pain. 9. Lisinopril 40 mg half tablet daily. 10.Vytorin 10/20 mg daily.  DISPOSITION:  [name] will be discharged in stable condition to home and is instructed to increase activity slowly and follow a heart- healthy diet.  She is to call or return if she notices any pain, swelling, bleeding, or pus at cath site.  She is not to lift anything over 5 pounds for 1  week, drive for few days, participate in sexual activity for 1 week.  She is to follow up with Dr. Daleen Squibb in approximately 6 months, and our office will call her with this appointment.  DURATION OF DISCHARGE ENCOUNTER:  Greater than 30 minutes including physician and PA time.     Dayna Dunn, P.A.C.   ______________________________ Jesse Sans Daleen Squibb, MD, [facility]    DD/MEDQ  D:  10/12/2010  T:  10/12/2010  Job:  [id number]  cc:   Purcell Nails, MD Jesse Sans. Daleen Squibb, MD, [facility] Dr. Juanetta Gosling  Electronically Signed by Valera Castle MD [facility] on 10/16/2010 08:00:04 AM Electronically Signed by Ronie Spies  on 10/26/2010 01:37:29 PM

## 2010-10-25 NOTE — H&P (Signed)
NAME:  Ann [name], [name]              ACCOUNT NO.:  [id number]  MEDICAL RECORD NO.:  [id number]           PATIENT TYPE:  I  LOCATION:  2918                         FACILITY:  [facility]  PHYSICIAN:  Cassell Clement, M.D. DATE OF BIRTH:  [birth year]  DATE OF ADMISSION:  10/10/2010 DATE OF DISCHARGE:                             HISTORY & PHYSICAL   PRIMARY CARDIOLOGIST:  Maisie Fus C. Wall, MD, [facility].  ENDOCRINOLOGIST:  Purcell Nails, MD.  PRIMARY CARE PROVIDER:  Dr. Juanetta Gosling.  CHIEF COMPLAINT:  Chest pain.  PAST MEDICAL HISTORY: 1. Coronary artery disease, status post bare-metal stent placed to the     LAD in 2000.     a.     Normal Myoview in 2007, ejection fraction 73%.     b.     Repeat catheterization in 2001 and 2002 showing      nonobstructive coronary artery disease. 2. Hypertension. 3. Hyperlipidemia. 4. Obesity. 5. Gastroesophageal reflux disease. 6. Hyperlipidemia. 7. Hypothyroidism. 8. Peptic ulcer disease. 9. Remote tobacco abuse. 10.COPD. 11.Crohn disease. 12.Possible Addison disease, currently being evaluated by Dr. Fransico Him at     [place]. 13.Status post cholecystectomy. 14.Status post hysterectomy. 15.Status post tubal ligation.  HISTORY OF PRESENT ILLNESS:  This is a 65 year old Caucasian female with known coronary artery disease, status post stent placed to the LAD in 2000 with most recent cath in 2002 showing nonobstructive disease.  The patient was recently discharged from [facility] with weakness and hypotension felt related to Addison disease, the patient is currently undergoing further workup with Dr. Fransico Him.  She has been placed on Decadron 4 mg daily.  The patient states she has been doing well until today.  She had an episode of chest pressure associated with shortness of breath.  She took a nitroglycerin which did ease the pain, but did not completely resolve it.  The patient states pain is similar to prior angina, although not as intense.  With the  pain, she presented to [facility] emergency department.  Her EKG showed T-wave inversions that are similar to prior tracings in 2022/09/27, although more pronounced in the inferior leads.  Currently, point-of-care markers are negative x2. After discussion with Dr. Patty Sermons, the patient was transferred to [facility] for possible cardiac catheterization.  She was placed on IV nitro and heparin.  During examination, the patient does have minimal chest pain.  She is tender to palpation anteriorly.  The patient denies any dyspnea on exertion, orthopnea or PND.  She does states that doing housework does not bring on chest pain.  She denies any recent fevers or chills.  The patient is currently hemodynamically stable and resting comfortably in the CCU.  SOCIAL HISTORY:  The patient lives in [place] with her husband.  She works at a [employer].  She has a history of tobacco abuse, but quit in 1980.  She currently does use snuff.  She denies any alcohol or illicit drug use.  FAMILY HISTORY:  Noncontributory for premature coronary artery disease. Her mother passed away at age of 79 from CVA and hypertension.  Her father passed away at age 64 from myocardial infarction.  Her  brother passed away with pulmonary emboli.  ALLERGIES:  Intolerance to NIACIN.  HOME MEDICATIONS: 1. Decadron 4 mg daily. 2. Quinapril 40 mg half tablet daily. 3. Advair Diskus 250/50 one puff inhaled twice daily. 4. Aspirin enteric-coated 81 mg daily. 5. Detrol LA 4 mg daily. 6. Hydrocodone/APAP 5/325 one to two tablet  q.4 hours as needed for     pain. 7. Levothyroxine 112 mcg daily. 8. Toprol-XL 25 mg daily. 9. Nexium 40 mg twice daily. 10.Nitroglycerin sublingual as needed. 11.Vytorin 10/20 nightly. 12.Tekturna 300 mg daily (The patient unsure if she is taking this,     although her pill bottle is in her bag).  REVIEW OF SYSTEMS:  All pertinent positives and negatives as stated in HPI.  Other systems have  been reviewed and are negative.  PHYSICAL EXAMINATION:  VITAL SIGNS:  Temperature 98.1, pulse 53, respirations 14, blood pressure 129/73, O2 saturation 95% on 1 liter. GENERAL:  This is a well-developed, well-nourished female.  She appears older than her stated age.  She is in no acute distress. HEENT:  Normal. NECK:  Supple without bruit or JVD. HEART:  Regular rate and rhythm with S1 and S2.  No murmur, rub or gallop noted.  PMI is normal.  Pulses are 2+ and equal bilaterally. CHEST:  Chest wall is tender to palpation anteriorly. LUNGS:  Clear to auscultation bilaterally without wheezes, rales or rhonchi. ABDOMEN:  Soft, nontender, positive bowel sounds x4. EXTREMITIES:  No clubbing, cyanosis or edema. MUSCULOSKELETAL:  No joint deformities or effusions. NEURO:  Alert and oriented x3, cranial nerves II through XII grossly intact.  Chest x-ray showing minimal left basal atelectasis.  There is enlargement of the cardiac silhouette.  EKG showing anterolateral T-wave inversions which has increased slightly since April 2012.  LABS:  WBC of 9.9, hemoglobin 12.4, hematocrit 39.1, platelet 205. Sodium 139, potassium 5.4, chloride 104, bicarb 28, BUN 30, creatinine 1.3.  Point-of-care markers negative x2.  ASSESSMENT AND PLAN:  This is a 65 year old female with known coronary artery disease, status post stent to left anterior descending in 2000, hypertension and hyperlipidemia, who presents with chest pain concerning for acute coronary syndrome.  The patient's pain is similar to her previous anginal presentation.  At this time, the patient has been admitted to the CCU.  She will be continued on IV nitroglycerin and IV heparin that was started at [facility].  We will cycle cardiac markers as well as EKGs and plan for left heart catheterization in the morning. Risks, benefits and indications have been discussed with the patient. She agrees to proceed.  The patient's potassium is mildly  elevated at 5.4, we will recheck her BMET now.  We will discontinue the patient's Tekturna as this could be the cause.  The patient has had workup for Addison's disease, we will be continue dexamethasone.  Further treatment will be dependent upon the above results.     Leonette Monarch, PA-C   ______________________________ Cassell Clement, M.D.    NB/MEDQ  D:  10/10/2010  T:  10/11/2010  Job:  [id number]  Electronically Signed by Alen Blew P.A. on 10/23/2010 10:15:36 AM Electronically Signed by Cassell Clement M.D. on 10/25/2010 12:48:00 PM

## 2010-10-26 NOTE — Cardiovascular Report (Signed)
  NAME:  Ann [name], Ann [name]              ACCOUNT NO.:  [id number]  MEDICAL RECORD NO.:  [id number]           PATIENT TYPE:  I  LOCATION:  3739                         FACILITY:  [facility]  PHYSICIAN:  Vesta Mixer, M.D. DATE OF BIRTH:  02-02-1946  DATE OF PROCEDURE:  10/11/2010 DATE OF DISCHARGE:                           CARDIAC CATHETERIZATION   [name] is a 65 year old female with a history of known coronary artery disease status post PTCA and stenting of her proximal/mid-LAD many years ago.  She also had a history of obesity, hypertension, hyperlipidemia, and hypothyroidism.  She presented to the hospital yesterday with episodes of chest discomfort.  She was scheduled for heart catheterization for further evaluation.  The procedure was left heart catheterization with coronary angiography.  The right femoral artery was cannulated using the assistance of a Smart needle.  The left ventricular pressure is 131/10.  The aortic pressure is 136/77.  Angiography, left main.  The left main is fairly normal.  The left anterior descending artery is fairly normal.  There is a stent in the proximal/mid-LAD.  There is a 20%-30% in-stent restenosis.  This was basically unchanged from her previous heart catheterization in 2002. The remaining LAD is fairly small and has no critical disease.  The first diagonal artery is small and is normal.  The left circumflex artery is a very large vessel.  The obtuse marginal artery is large and normal.  There are no significant stenosis.  The right coronary artery has a very high and anterior takeoff.  It is smooth and normal throughout its course.  The posterior descending artery and the posterolateral segment artery are normal.  The left ventriculogram was performed in a 30 RAO position.  It reveals hyperdynamic left ventricular systolic function with an ejection fraction of 70%.  There are no segmental wall motion  abnormalities.  COMPLICATIONS:  None.  CONCLUSION:  Minor coronary artery irregularities.  She has a slight in- stent restenosis of her LAD.  This degree of stenosis was present at her previous heart catheterization in 2009.  She is stable.  We anticipate that she will be discharge later today.     Vesta Mixer, M.D.     PJN/MEDQ  D:  10/11/2010  T:  10/11/2010  Job:  [id number]  cc:   Thomas C. Daleen Squibb, MD, [facility] Dr. Juanetta Gosling  Electronically Signed by Kristeen Miss M.D. on 10/26/2010 04:47:22 PM

## 2010-12-11 ENCOUNTER — Other Ambulatory Visit (HOSPITAL_COMMUNITY): Payer: Self-pay | Admitting: Pulmonary Disease

## 2010-12-11 DIAGNOSIS — M545 Low back pain: Secondary | ICD-10-CM

## 2010-12-13 ENCOUNTER — Ambulatory Visit (HOSPITAL_COMMUNITY)
Admission: RE | Admit: 2010-12-13 | Discharge: 2010-12-13 | Disposition: A | Discharge status: Home / Self Care | Payer: Medicare Other | Source: Ambulatory Visit | Attending: Pulmonary Disease | Admitting: Pulmonary Disease

## 2010-12-13 DIAGNOSIS — M47817 Spondylosis without myelopathy or radiculopathy, lumbosacral region: Secondary | ICD-10-CM | POA: Insufficient documentation

## 2010-12-13 DIAGNOSIS — M545 Low back pain, unspecified: Secondary | ICD-10-CM | POA: Insufficient documentation

## 2010-12-13 DIAGNOSIS — M5126 Other intervertebral disc displacement, lumbar region: Secondary | ICD-10-CM | POA: Insufficient documentation

## 2011-01-16 ENCOUNTER — Other Ambulatory Visit: Payer: Self-pay

## 2011-01-16 ENCOUNTER — Emergency Department (HOSPITAL_COMMUNITY): Payer: Medicare Other

## 2011-01-16 ENCOUNTER — Encounter: Payer: Self-pay | Admitting: *Deleted

## 2011-01-16 ENCOUNTER — Emergency Department (HOSPITAL_COMMUNITY)
Admission: EM | Admit: 2011-01-16 | Discharge: 2011-01-17 | Disposition: A | Discharge status: Home / Self Care | Payer: Medicare Other | Attending: Emergency Medicine | Admitting: Emergency Medicine

## 2011-01-16 DIAGNOSIS — K219 Gastro-esophageal reflux disease without esophagitis: Secondary | ICD-10-CM | POA: Insufficient documentation

## 2011-01-16 DIAGNOSIS — E785 Hyperlipidemia, unspecified: Secondary | ICD-10-CM | POA: Insufficient documentation

## 2011-01-16 DIAGNOSIS — E1169 Type 2 diabetes mellitus with other specified complication: Secondary | ICD-10-CM | POA: Insufficient documentation

## 2011-01-16 DIAGNOSIS — I2510 Atherosclerotic heart disease of native coronary artery without angina pectoris: Secondary | ICD-10-CM | POA: Insufficient documentation

## 2011-01-16 DIAGNOSIS — E039 Hypothyroidism, unspecified: Secondary | ICD-10-CM | POA: Insufficient documentation

## 2011-01-16 DIAGNOSIS — E7800 Pure hypercholesterolemia, unspecified: Secondary | ICD-10-CM | POA: Insufficient documentation

## 2011-01-16 DIAGNOSIS — E119 Type 2 diabetes mellitus without complications: Secondary | ICD-10-CM | POA: Diagnosis present

## 2011-01-16 DIAGNOSIS — R609 Edema, unspecified: Secondary | ICD-10-CM | POA: Insufficient documentation

## 2011-01-16 DIAGNOSIS — R739 Hyperglycemia, unspecified: Secondary | ICD-10-CM

## 2011-01-16 DIAGNOSIS — Z794 Long term (current) use of insulin: Secondary | ICD-10-CM | POA: Insufficient documentation

## 2011-01-16 HISTORY — DX: Morbid (severe) obesity due to excess calories: E66.01

## 2011-01-16 HISTORY — DX: Chronic obstructive pulmonary disease, unspecified: J44.9

## 2011-01-16 HISTORY — DX: Unspecified firearm discharge, undetermined intent, initial encounter: Y24.9XXA

## 2011-01-16 HISTORY — DX: Essential (primary) hypertension: I10

## 2011-01-16 HISTORY — DX: Hypothyroidism, unspecified: E03.9

## 2011-01-16 HISTORY — DX: Accidental discharge from unspecified firearms or gun, initial encounter: W34.00XA

## 2011-01-16 LAB — URINALYSIS, ROUTINE W REFLEX MICROSCOPIC
Glucose, UA: >1000 mg/dL — AB
Leukocytes, UA: NEGATIVE
Protein, ur: NEGATIVE mg/dL
Specific Gravity, Urine: 1.02 (ref 1.005–1.030)

## 2011-01-16 LAB — COMPREHENSIVE METABOLIC PANEL
AST: 19 U/L (ref 0–37)
Albumin: 3.8 g/dL (ref 3.5–5.2)
BUN: 23 mg/dL (ref 6–23)
Calcium: 9.5 mg/dL (ref 8.4–10.5)
Creatinine, Ser: 1.07 mg/dL (ref 0.50–1.10)
Total Protein: 6.8 g/dL (ref 6.0–8.3)

## 2011-01-16 LAB — GLUCOSE, CAPILLARY
Glucose-Capillary: 572 mg/dL (ref 70–99)
Glucose-Capillary: 466 mg/dL — ABNORMAL HIGH (ref 70–99)

## 2011-01-16 LAB — DIFFERENTIAL
Basophils Absolute: 0 10*3/uL (ref 0.0–0.1)
Basophils Relative: 0 % (ref 0–1)
Eosinophils Relative: 0 % (ref 0–5)
Lymphocytes Relative: 9 % — ABNORMAL LOW (ref 12–46)

## 2011-01-16 LAB — CBC
MCHC: 33.7 g/dL (ref 30.0–36.0)
MCV: 87.8 fL (ref 78.0–100.0)
Platelets: 204 10*3/uL (ref 150–400)
RDW: 13.6 % (ref 11.5–15.5)
WBC: 15 10*3/uL — ABNORMAL HIGH (ref 4.0–10.5)

## 2011-01-16 LAB — URINE MICROSCOPIC-ADD ON

## 2011-01-16 LAB — LIPASE, BLOOD: Lipase: 34 U/L (ref 11–59)

## 2011-01-16 MED ORDER — SODIUM CHLORIDE 0.9 % IV SOLN
1000.0000 mL | INTRAVENOUS | Status: AC
  Administered 2011-01-16: 1000 mL via INTRAVENOUS

## 2011-01-16 MED ORDER — SODIUM CHLORIDE 0.9 % IV BOLUS (SEPSIS)
500.0000 mL | Freq: Once | INTRAVENOUS | Status: AC
  Administered 2011-01-16: 500 mL via INTRAVENOUS

## 2011-01-16 MED ORDER — SODIUM CHLORIDE 0.9 % IV SOLN
INTRAVENOUS | Status: DC
  Administered 2011-01-16: 4.1 [IU]/h via INTRAVENOUS
  Filled 2011-01-16: qty 1

## 2011-01-16 NOTE — ED Notes (Signed)
Pt states she was diagnosed with diabetes today and when she checked her CBG at home it was over 600

## 2011-01-16 NOTE — ED Notes (Signed)
MD at bedside. 

## 2011-01-16 NOTE — ED Notes (Signed)
Transported to xray 

## 2011-01-16 NOTE — ED Notes (Signed)
Family at bedside. 

## 2011-01-16 NOTE — ED Notes (Signed)
Patient just dx with diabetes today and has checked CBG's at home with high readings, pt has not taken any insulin yet, please see EDP's note for further
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
## 2011-01-16 NOTE — ED Notes (Signed)
Called Respiratory again for blood gas to be collected

## 2011-01-17 LAB — BLOOD GAS, VENOUS
Acid-Base Excess: 3.9 mmol/L — ABNORMAL HIGH (ref 0.0–2.0)
O2 Content: 21 L/min
O2 Saturation: 70.8 %
pO2, Ven: 38.5 mmHg (ref 30.0–45.0)

## 2011-01-17 LAB — GLUCOSE, CAPILLARY
Glucose-Capillary: 315 mg/dL — ABNORMAL HIGH (ref 70–99)
Glucose-Capillary: 420 mg/dL — ABNORMAL HIGH (ref 70–99)

## 2011-01-17 NOTE — ED Notes (Signed)
EDP notified of pt's CBG of 248, was instructed to stop insulin gtt and pt for discharge home

## 2011-01-17 NOTE — ED Notes (Signed)
Pt left there er stating no needs

## 2011-01-17 NOTE — Progress Notes (Signed)
1610 On the glucose stabilizer, glucose now 243. Per plan patient to be discharged home and to take her levemir. VSS. Pt feels improved after observation and/or treatment in ED.

## 2011-01-17 NOTE — Progress Notes (Signed)
0010 Assumed care/disposition of patient. Patient is a new onset diabetic who began care with endocrinologist, Dr. Fransico Him. She had four home glucose reading over 400 and cane to the ER for management per Dr. Isidoro Donning instructions. She is currently on the glucose stabilizer. The plan is for discharge home after she has reduced glucose to around 250. At that time she will be able to take her levemir. Patient / Family understand and agree with initial ED impression and plan with expectations set for ED visit.

## 2011-01-26 DIAGNOSIS — L0390 Cellulitis, unspecified: Secondary | ICD-10-CM

## 2011-01-26 HISTORY — DX: Cellulitis, unspecified: L03.90

## 2011-02-09 ENCOUNTER — Encounter (HOSPITAL_COMMUNITY): Payer: Self-pay

## 2011-02-09 ENCOUNTER — Inpatient Hospital Stay (HOSPITAL_COMMUNITY)
Admission: EM | Admit: 2011-02-09 | Discharge: 2011-02-13 | DRG: 603 | Disposition: A | Discharge status: Home Health | Payer: Medicare Other | Attending: Pulmonary Disease | Admitting: Pulmonary Disease

## 2011-02-09 DIAGNOSIS — I10 Essential (primary) hypertension: Secondary | ICD-10-CM | POA: Insufficient documentation

## 2011-02-09 DIAGNOSIS — E119 Type 2 diabetes mellitus without complications: Secondary | ICD-10-CM | POA: Diagnosis present

## 2011-02-09 DIAGNOSIS — N189 Chronic kidney disease, unspecified: Secondary | ICD-10-CM | POA: Insufficient documentation

## 2011-02-09 DIAGNOSIS — L089 Local infection of the skin and subcutaneous tissue, unspecified: Secondary | ICD-10-CM

## 2011-02-09 DIAGNOSIS — E663 Overweight: Secondary | ICD-10-CM | POA: Insufficient documentation

## 2011-02-09 DIAGNOSIS — I2510 Atherosclerotic heart disease of native coronary artery without angina pectoris: Secondary | ICD-10-CM | POA: Insufficient documentation

## 2011-02-09 DIAGNOSIS — E039 Hypothyroidism, unspecified: Secondary | ICD-10-CM | POA: Insufficient documentation

## 2011-02-09 DIAGNOSIS — L02419 Cutaneous abscess of limb, unspecified: Principal | ICD-10-CM | POA: Diagnosis present

## 2011-02-09 DIAGNOSIS — L03119 Cellulitis of unspecified part of limb: Principal | ICD-10-CM | POA: Diagnosis present

## 2011-02-09 DIAGNOSIS — K219 Gastro-esophageal reflux disease without esophagitis: Secondary | ICD-10-CM | POA: Insufficient documentation

## 2011-02-09 DIAGNOSIS — Z9861 Coronary angioplasty status: Secondary | ICD-10-CM

## 2011-02-09 DIAGNOSIS — L03116 Cellulitis of left lower limb: Secondary | ICD-10-CM | POA: Diagnosis present

## 2011-02-09 DIAGNOSIS — I129 Hypertensive chronic kidney disease with stage 1 through stage 4 chronic kidney disease, or unspecified chronic kidney disease: Secondary | ICD-10-CM | POA: Diagnosis present

## 2011-02-09 DIAGNOSIS — E782 Mixed hyperlipidemia: Secondary | ICD-10-CM | POA: Insufficient documentation

## 2011-02-09 LAB — COMPREHENSIVE METABOLIC PANEL
ALT: 26 U/L (ref 0–35)
Alkaline Phosphatase: 80 U/L (ref 39–117)
BUN: 20 mg/dL (ref 6–23)
Chloride: 102 mEq/L (ref 96–112)
GFR calc Af Amer: >60 mL/min (ref >60)
Glucose, Bld: 147 mg/dL — ABNORMAL HIGH (ref 70–99)
Potassium: 4.2 mEq/L (ref 3.5–5.1)
Sodium: 137 mEq/L (ref 135–145)
Total Bilirubin: 0.3 mg/dL (ref 0.3–1.2)
Total Protein: 6.3 g/dL (ref 6.0–8.3)

## 2011-02-09 LAB — DIFFERENTIAL
Eosinophils Relative: 1 % (ref 0–5)
Lymphocytes Relative: 11 % — ABNORMAL LOW (ref 12–46)
Monocytes Absolute: 0.8 10*3/uL (ref 0.1–1.0)
Monocytes Relative: 9 % (ref 3–12)
Neutro Abs: 7.2 10*3/uL (ref 1.7–7.7)

## 2011-02-09 LAB — GLUCOSE, CAPILLARY
Glucose-Capillary: 127 mg/dL — ABNORMAL HIGH (ref 70–99)
Glucose-Capillary: 126 mg/dL — ABNORMAL HIGH (ref 70–99)

## 2011-02-09 LAB — CBC
HCT: 36.1 % (ref 36.0–46.0)
Hemoglobin: 11.7 g/dL — ABNORMAL LOW (ref 12.0–15.0)
MCV: 91.2 fL (ref 78.0–100.0)
WBC: 9.1 10*3/uL (ref 4.0–10.5)

## 2011-02-09 MED ORDER — ALBUTEROL SULFATE (5 MG/ML) 0.5% IN NEBU
INHALATION_SOLUTION | RESPIRATORY_TRACT | Status: AC
  Administered 2011-02-09: 2.5 mg via RESPIRATORY_TRACT
  Filled 2011-02-09: qty 0.5

## 2011-02-09 MED ORDER — TOLTERODINE TARTRATE ER 2 MG PO CP24
4.0000 mg | ORAL_CAPSULE | Freq: Every day | ORAL | Status: DC
  Administered 2011-02-09 – 2011-02-13 (×5): 4 mg via ORAL
  Filled 2011-02-09 (×5): qty 2

## 2011-02-09 MED ORDER — METOPROLOL TARTRATE 50 MG PO TABS
50.0000 mg | ORAL_TABLET | Freq: Every day | ORAL | Status: DC
  Administered 2011-02-10 – 2011-02-13 (×4): 50 mg via ORAL
  Filled 2011-02-09 (×4): qty 1

## 2011-02-09 MED ORDER — PANTOPRAZOLE SODIUM 40 MG PO TBEC
40.0000 mg | DELAYED_RELEASE_TABLET | Freq: Two times a day (BID) | ORAL | Status: DC
  Administered 2011-02-09 – 2011-02-13 (×8): 40 mg via ORAL
  Filled 2011-02-09 (×8): qty 1

## 2011-02-09 MED ORDER — ASPIRIN 81 MG PO CHEW
81.0000 mg | CHEWABLE_TABLET | Freq: Every day | ORAL | Status: DC
  Administered 2011-02-10 – 2011-02-13 (×4): 81 mg via ORAL
  Filled 2011-02-09 (×4): qty 1

## 2011-02-09 MED ORDER — FLUTICASONE PROPIONATE 50 MCG/ACT NA SUSP
NASAL | Status: AC
  Filled 2011-02-09: qty 320000

## 2011-02-09 MED ORDER — IPRATROPIUM-ALBUTEROL 0.5-2.5 (3) MG/3ML IN SOLN
3.0000 mL | RESPIRATORY_TRACT | Status: DC
  Administered 2011-02-10: 3 mL via RESPIRATORY_TRACT
  Filled 2011-02-09: qty 3

## 2011-02-09 MED ORDER — INSULIN ASPART 100 UNIT/ML ~~LOC~~ SOLN
15.0000 [IU] | Freq: Three times a day (TID) | SUBCUTANEOUS | Status: DC
  Administered 2011-02-09 – 2011-02-13 (×10): 15 [IU] via SUBCUTANEOUS
  Filled 2011-02-09: qty 3

## 2011-02-09 MED ORDER — PREDNISONE 10 MG PO TABS
10.0000 mg | ORAL_TABLET | Freq: Every day | ORAL | Status: DC
  Administered 2011-02-10 – 2011-02-13 (×4): 10 mg via ORAL
  Filled 2011-02-09 (×4): qty 1

## 2011-02-09 MED ORDER — VANCOMYCIN HCL IN DEXTROSE 1-5 GM/200ML-% IV SOLN
1000.0000 mg | Freq: Once | INTRAVENOUS | Status: AC
  Administered 2011-02-09: 1000 mg via INTRAVENOUS
  Filled 2011-02-09: qty 200

## 2011-02-09 MED ORDER — ENOXAPARIN SODIUM 40 MG/0.4ML ~~LOC~~ SOLN
40.0000 mg | SUBCUTANEOUS | Status: DC
  Administered 2011-02-09 – 2011-02-12 (×4): 40 mg via SUBCUTANEOUS
  Filled 2011-02-09 (×4): qty 0.4

## 2011-02-09 MED ORDER — FLUTICASONE PROPIONATE 50 MCG/ACT NA SUSP
1.0000 | Freq: Every day | NASAL | Status: DC
  Administered 2011-02-09 – 2011-02-12 (×3): 1 via NASAL
  Filled 2011-02-09: qty 320000

## 2011-02-09 MED ORDER — ROSUVASTATIN CALCIUM 20 MG PO TABS
40.0000 mg | ORAL_TABLET | Freq: Every day | ORAL | Status: DC
  Administered 2011-02-09 – 2011-02-12 (×4): 40 mg via ORAL
  Filled 2011-02-09 (×2): qty 1
  Filled 2011-02-09 (×3): qty 2

## 2011-02-09 MED ORDER — FUROSEMIDE 10 MG/ML IJ SOLN
40.0000 mg | Freq: Two times a day (BID) | INTRAMUSCULAR | Status: DC
  Administered 2011-02-09 – 2011-02-12 (×6): 40 mg via INTRAVENOUS
  Filled 2011-02-09 (×8): qty 4

## 2011-02-09 MED ORDER — IPRATROPIUM BROMIDE 0.02 % IN SOLN
RESPIRATORY_TRACT | Status: AC
  Administered 2011-02-09: 0.5 mg via RESPIRATORY_TRACT
  Filled 2011-02-09: qty 2.5

## 2011-02-09 MED ORDER — INSULIN GLARGINE 100 UNIT/ML ~~LOC~~ SOLN
SUBCUTANEOUS | Status: AC
  Administered 2011-02-09: 50 [IU]
  Filled 2011-02-09: qty 3

## 2011-02-09 MED ORDER — NITROGLYCERIN 0.3 MG SL SUBL
0.3000 mg | SUBLINGUAL_TABLET | SUBLINGUAL | Status: DC | PRN
  Filled 2011-02-09: qty 100

## 2011-02-09 MED ORDER — SODIUM CHLORIDE 0.9 % IJ SOLN
10.0000 mL | Freq: Once | INTRAMUSCULAR | Status: AC
  Administered 2011-02-09: 10 mL via INTRAVENOUS

## 2011-02-09 MED ORDER — LEVOTHYROXINE SODIUM 112 MCG PO TABS
112.0000 ug | ORAL_TABLET | Freq: Every day | ORAL | Status: DC
  Administered 2011-02-10 – 2011-02-13 (×4): 112 ug via ORAL
  Filled 2011-02-09 (×4): qty 1

## 2011-02-09 MED ORDER — INSULIN ASPART 100 UNIT/ML ~~LOC~~ SOLN: 0.0000 [IU] | Freq: Every day | SUBCUTANEOUS | Status: DC

## 2011-02-09 MED ORDER — LISINOPRIL 10 MG PO TABS
20.0000 mg | ORAL_TABLET | Freq: Every day | ORAL | Status: DC
  Administered 2011-02-10 – 2011-02-13 (×4): 20 mg via ORAL
  Filled 2011-02-09 (×4): qty 2

## 2011-02-09 MED ORDER — VANCOMYCIN HCL IN DEXTROSE 1-5 GM/200ML-% IV SOLN
1000.0000 mg | INTRAVENOUS | Status: DC
  Filled 2011-02-09 (×2): qty 200

## 2011-02-09 MED ORDER — INSULIN DETEMIR 100 UNIT/ML ~~LOC~~ SOLN
50.0000 [IU] | Freq: Every day | SUBCUTANEOUS | Status: DC
  Filled 2011-02-09: qty 3

## 2011-02-09 MED ORDER — INSULIN ASPART 100 UNIT/ML ~~LOC~~ SOLN
0.0000 [IU] | Freq: Three times a day (TID) | SUBCUTANEOUS | Status: DC
  Administered 2011-02-09 – 2011-02-12 (×3): 3 [IU] via SUBCUTANEOUS

## 2011-02-09 MED ORDER — OXYCODONE-ACETAMINOPHEN 5-325 MG PO TABS
1.0000 | ORAL_TABLET | Freq: Four times a day (QID) | ORAL | Status: DC | PRN
  Administered 2011-02-09 – 2011-02-13 (×7): 1 via ORAL
  Filled 2011-02-09 (×7): qty 1

## 2011-02-09 MED ORDER — VANCOMYCIN HCL IN DEXTROSE 1-5 GM/200ML-% IV SOLN
1000.0000 mg | Freq: Two times a day (BID) | INTRAVENOUS | Status: DC
  Administered 2011-02-10 – 2011-02-13 (×7): 1000 mg via INTRAVENOUS
  Filled 2011-02-09 (×8): qty 200
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
## 2011-02-09 NOTE — H&P (Signed)
  [id number] 

## 2011-02-09 NOTE — H&P (Signed)
NAME:  Ann [name], Ann [name]              ACCOUNT NO.:  [id number]  MEDICAL RECORD NO.:  [id number]  LOCATION:  [facility]                         FACILITY:  [facility]  PHYSICIAN:  Purcell Nails, MD DATE OF BIRTH:  12-22-1945  DATE OF ADMISSION:  02/09/2011 DATE OF DISCHARGE:  LH                             HISTORY & PHYSICAL   CHIEF COMPLAINT:  Left lower extremity swelling and redness.  HISTORY OF PRESENT ILLNESS:  This is a 65 year old Caucasian female with multiple medical problems including type 2 diabetes recently diagnosed, but poorly controlled, COPD, hypertension, steroids-induced adrenal insufficiency, GERD, hypothyroidism, morbid obesity, and hypercholesterolemia.  She checked into the emergency room complaining of worsening redness and swelling on her left lower extremity.  She was seen and treated with oral antibiotics for superficial cellulitis of left lower extremity by Dr. Juanetta Gosling, over the last several days, however, she has not seen much improvement.  She denied fever.  No significant pain was associated with the swelling.  She was found to have significant hyperemia and warmth on left lower extremity which was also found to be edematous and the patient is subsequently admitted with a diagnosis of cellulitis.  PAST MEDICAL HISTORY:  As above.  PAST SURGICAL HISTORY:  Coronary angioplasty with stent placement, tubal ligation, abdominal hysterectomy, cholecystectomy, appendectomy, knee surgery, neck surgery, nose cyst surgery, and finger surgery.  FAMILY HISTORY:  Noncontributory.  SOCIAL HISTORY:  Former smoker.  No alcohol or drug use.  REVIEW OF SYSTEMS:  Negative for chest pain or cough.  Negative for abdominal pain or diarrhea.  Negative for urinary frequency or urgency. Negative for seizures or headaches.  Positive for skin hyperemia more pronounced on the left lower extremity, but also to some degree on the right lower extremity, also has swelling on both bilateral  lower extremities.  ALLERGIES:  She is allergic to NIASPAN.  HOME MEDICATIONS: 1. Aspirin. 2. Doxycycline. 3. Esomeprazole. 4. Fludrocortisone 0.1 mg daily. 5. Fluticasone. 6. Furosemide. 7. Insulin NovoLog. 8. Insulin Levemir. 9. Levothyroxine 112 mcg daily. 10.Tradjenta 5 mg daily. 11.Metoprolol 25 mg daily. 12.Mupirocin 2% ointment. 13.Prednisone 10 mg daily. 14.Quinapril. 15.Potassium chloride. 16.Zetia/simvastatin 10/20 mg daily. 17.Nitroglycerin.  PHYSICAL EXAMINATION:  GENERAL:  She is alert and oriented x3, not in acute distress, cooperative, obese to cushingoid in appearance. HEENT:  Moist mucous membranes.  No icterus.  No pallor.  No JVD.  No thyromegaly. CHEST:  Significant for poor air entry bilaterally. CARDIOVASCULAR:  Distant heart sounds.  No murmur.  No gallop. ABDOMEN:  Obese, otherwise nontender. EXTREMITIES:  She has pitting edema on bilateral lower extremities, significant hyperemia on the left lower extremity, and mild hyperemia on the right lower extremity, nontender,  slightly warm to touch on the left lower extremity. CNS:  Nonfocal. PSYCHIATRIC:  She has normal affect.  Her lab work showed sodium 137, potassium 4.2, chloride 102, bicarb 28, BUN 20, creatinine 0.9.  CBC within normal limits, blood glucose 120- 150.  ASSESSMENT AND PLAN: 1. Cellulitis of left lower extremity, failed outpatient treatment     with oral antibiotics. 2. Significant peripheral edema. 3. Type 2 diabetes, uncontrolled. 4. Steroid-induced adrenal insufficiency. 5. Chronic obstructive pulmonary disease. 6. Hypertension. 7. Gastroesophageal reflux  disease. 8. Hypothyroidism. 9. Morbid obesity. 10.Hypercholesterolemia.  PLAN: 1. Vancomycin 1 g IV daily pharmacy to dose and follow. 2. DVT prophylaxis with Lovenox 40 mg daily. 3. Lasix 40 mg IV b.i.d. 4. DuoNeb every 4 hours while awake. 5. Nitroglycerin 0.4 mg for chest pain. 6. Prednisone 10 mg daily. 7.  Flonase 0.1 mg daily. 8. Levemir 50 units nightly. 9. NovoLog 15 units t.i.d. 10.________ sliding scale. 11.Hold Tradjenta. 12.Continue her antihypertensives from home. 13.Will continue to monitor her clinical progress.                                           ______________________________ Purcell Nails, MD     GN/MEDQ  D:  02/09/2011  T:  02/09/2011  Job:  [id number]

## 2011-02-09 NOTE — ED Notes (Signed)
Pt states she went to her pcp Thursday for cellulits in her lower legs. States she was given an antibiotic and a cream. States her legs are getting worse.

## 2011-02-10 LAB — CBC
Hemoglobin: 10.9 g/dL — ABNORMAL LOW (ref 12.0–15.0)
MCH: 29.5 pg (ref 26.0–34.0)
MCV: 91.1 fL (ref 78.0–100.0)
Platelets: 185 10*3/uL (ref 150–400)
RBC: 3.69 MIL/uL — ABNORMAL LOW (ref 3.87–5.11)
WBC: 7.4 10*3/uL (ref 4.0–10.5)

## 2011-02-10 LAB — DIFFERENTIAL
Eosinophils Absolute: 0.1 10*3/uL (ref 0.0–0.7)
Lymphocytes Relative: 27 % (ref 12–46)
Lymphs Abs: 2 10*3/uL (ref 0.7–4.0)
Monocytes Relative: 11 % (ref 3–12)
Neutrophils Relative %: 60 % (ref 43–77)

## 2011-02-10 LAB — BASIC METABOLIC PANEL
BUN: 18 mg/dL (ref 6–23)
CO2: 29 mEq/L (ref 19–32)
Chloride: 104 mEq/L (ref 96–112)
Glucose, Bld: 120 mg/dL — ABNORMAL HIGH (ref 70–99)
Potassium: 3.6 mEq/L (ref 3.5–5.1)
Sodium: 138 mEq/L (ref 135–145)

## 2011-02-10 LAB — GLUCOSE, CAPILLARY: Glucose-Capillary: 176 mg/dL — ABNORMAL HIGH (ref 70–99)

## 2011-02-10 MED ORDER — IPRATROPIUM BROMIDE 0.02 % IN SOLN
0.5000 mg | RESPIRATORY_TRACT | Status: DC
  Administered 2011-02-09 – 2011-02-13 (×14): 0.5 mg via RESPIRATORY_TRACT
  Filled 2011-02-10 (×13): qty 2.5

## 2011-02-10 MED ORDER — SODIUM CHLORIDE 0.9 % IJ SOLN
INTRAMUSCULAR | Status: AC
  Administered 2011-02-10: 14:00:00
  Filled 2011-02-10: qty 10

## 2011-02-10 MED ORDER — ALBUTEROL SULFATE (5 MG/ML) 0.5% IN NEBU
2.5000 mg | INHALATION_SOLUTION | RESPIRATORY_TRACT | Status: DC
  Administered 2011-02-09 – 2011-02-13 (×14): 2.5 mg via RESPIRATORY_TRACT
  Filled 2011-02-10 (×13): qty 0.5

## 2011-02-10 MED ORDER — NITROGLYCERIN 0.4 MG SL SUBL: 0.4000 mg | SUBLINGUAL_TABLET | SUBLINGUAL | Status: DC | PRN

## 2011-02-10 MED ORDER — INSULIN GLARGINE 100 UNIT/ML ~~LOC~~ SOLN
50.0000 [IU] | Freq: Every day | SUBCUTANEOUS | Status: DC
  Administered 2011-02-11: 50 [IU] via SUBCUTANEOUS
  Filled 2011-02-10: qty 3

## 2011-02-10 MED ORDER — ALBUTEROL SULFATE (5 MG/ML) 0.5% IN NEBU
INHALATION_SOLUTION | RESPIRATORY_TRACT | Status: AC
  Filled 2011-02-10: qty 0.5

## 2011-02-10 MED ORDER — ALBUTEROL SULFATE (5 MG/ML) 0.5% IN NEBU
INHALATION_SOLUTION | RESPIRATORY_TRACT | Status: AC
  Administered 2011-02-10: 2.5 mg via RESPIRATORY_TRACT
  Filled 2011-02-10: qty 0.5

## 2011-02-10 MED ORDER — IPRATROPIUM BROMIDE 0.02 % IN SOLN
RESPIRATORY_TRACT | Status: AC
  Administered 2011-02-10: 0.5 mg via RESPIRATORY_TRACT
  Filled 2011-02-10: qty 2.5

## 2011-02-10 MED ORDER — VANCOMYCIN HCL IN DEXTROSE 1-5 GM/200ML-% IV SOLN
INTRAVENOUS | Status: AC
  Filled 2011-02-10: qty 200

## 2011-02-10 MED ORDER — IPRATROPIUM BROMIDE 0.02 % IN SOLN
RESPIRATORY_TRACT | Status: AC
  Filled 2011-02-10: qty 2.5

## 2011-02-10 MED ORDER — SODIUM CHLORIDE 0.9 % IJ SOLN
INTRAMUSCULAR | Status: AC
  Administered 2011-02-10: 15:00:00
  Filled 2011-02-10: qty 10
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
## 2011-02-10 NOTE — Progress Notes (Signed)
Subjective: She was admitted yesterday with cellulitis of the leg. This is mostly on the left leg although she has a little bit of change on the right also. She was being treated with outpatient oral antibiotics and failed. She has multiple other medical problems  Objective: Vital signs in last 24 hours: Temp:  [97.6 F (36.4 C)-98.2 F (36.8 C)] 98.2 F (36.8 C) (09/16 0557) Pulse Rate:  [56-71] 64  (09/16 0557) Resp:  [18-20] 18  (09/16 0557) BP: (107-139)/(70-95) 139/89 mmHg (09/16 0557) SpO2:  [94 %-98 %] 97 % (09/16 0557) Weight:  [127.9 kg (281 lb 15.5 oz)] 281 lb 15.5 oz (127.9 kg) (09/15 1725) Weight change:  Last BM Date: 02/09/11  Intake/Output from previous day: 09/15 0701 - 09/16 0700 In: 370 [P.O.:360; I.V.:10] Out: 1000 [Urine:1000]  PHYSICAL EXAM General appearance: alert, cooperative, mild distress and morbidly obese Resp: clear to auscultation bilaterally Cardio: regular rate and rhythm, S1, S2 normal, no murmur, click, rub or gallop GI: soft, non-tender; bowel sounds normal; no masses,  no organomegaly Extremities: She has a well-demarcated area of erythema on her left lower extremity. This has been marked with permanent marker and seems to have not changed  Lab Results:    Basic Metabolic Panel:  Basename 02/10/11 0446 02/09/11 1240  NA 138 137  K 3.6 4.2  CL 104 102  CO2 29 28  GLUCOSE 120* 147*  BUN 18 20  CREATININE 0.85 0.90  CALCIUM 8.5 8.8  MG -- --  PHOS -- --   Liver Function Tests:  Basename 02/09/11 1240  AST 15  ALT 26  ALKPHOS 80  BILITOT 0.3  PROT 6.3  ALBUMIN 3.0*   No results found for this basename: LIPASE:2,AMYLASE:2 in the last 72 hours No results found for this basename: AMMONIA:2 in the last 72 hours CBC:  Basename 02/10/11 0446 02/09/11 1240  WBC 7.4 9.1  NEUTROABS 4.4 7.2  HGB 10.9* 11.7*  HCT 33.6* 36.1  MCV 91.1 91.2  PLT 185 187   Cardiac Enzymes: No results found for this basename:  CKTOTAL:3,CKMB:3,CKMBINDEX:3,TROPONINI:3 in the last 72 hours BNP: No results found for this basename: POCBNP:3 in the last 72 hours D-Dimer: No results found for this basename: DDIMER:2 in the last 72 hours CBG:  Basename 02/09/11 2042 02/09/11 1644 02/09/11 1410  GLUCAP 126* 170* 127*   Hemoglobin A1C: No results found for this basename: HGBA1C in the last 72 hours Fasting Lipid Panel: No results found for this basename: CHOL,HDL,LDLCALC,TRIG,CHOLHDL,LDLDIRECT in the last 72 hours Thyroid Function Tests: No results found for this basename: TSH,T4TOTAL,FREET4,T3FREE,THYROIDAB in the last 72 hours Anemia Panel: No results found for this basename: VITAMINB12,FOLATE,FERRITIN,TIBC,IRON,RETICCTPCT in the last 72 hours Urine Drug Screen:  Alcohol Level: No results found for this basename: ETH:2 in the last 72 hours Urinalysis:  Misc. Labs:  ABGS No results found for this basename: PHART,PCO2,PO2ART,TCO2,HCO3 in the last 72 hours CULTURES No results found for this or any previous visit (from the past 240 hour(s)). Studies/Results: No results found.  Medications:  Prior to Admission:  Prescriptions prior to admission  Medication Sig Dispense Refill  . aspirin 81 MG tablet Take 81 mg by mouth daily.        Marland Kitchen doxycycline (VIBRA-TABS) 100 MG tablet Take 100 mg by mouth 2 (two) times daily. Started on 02-05-11 for 10 days       . esomeprazole (NEXIUM) 40 MG capsule Take 40 mg by mouth 2 (two) times daily.       Marland Kitchen  fludrocortisone (FLORINEF) 0.1 MG tablet Take 0.1 mg by mouth daily.        . Fluticasone-Salmeterol (ADVAIR) 250-50 MCG/DOSE AEPB Inhale 1 puff into the lungs every 12 (twelve) hours.        . furosemide (LASIX) 40 MG tablet Take 40 mg by mouth daily.        . insulin aspart (NOVOLOG FLEXPEN) 100 UNIT/ML injection Inject 20-27 Units into the skin 3 (three) times daily as needed. Only for levels <above 90       . insulin detemir (LEVEMIR) 100 UNIT/ML injection Inject 50 Units  into the skin at bedtime.       Marland Kitchen levothyroxine (SYNTHROID, LEVOTHROID) 112 MCG tablet Take 112 mcg by mouth daily.        Marland Kitchen linagliptin (TRADJENTA) 5 MG TABS tablet Take 5 mg by mouth daily.        . metoprolol succinate (TOPROL-XL) 25 MG 24 hr tablet Take 50 mg by mouth daily.        . mupirocin (BACTROBAN) 2 % ointment Apply 1 application topically 2 (two) times daily. Affected area       . predniSONE (DELTASONE) 10 MG tablet Take 10 mg by mouth daily.        . quinapril (ACCUPRIL) 40 MG tablet Take 20 mg by mouth every morning.       . rosuvastatin (CRESTOR) 40 MG tablet Take 40 mg by mouth at bedtime.       . tolterodine (DETROL LA) 4 MG 24 hr capsule Take 4 mg by mouth daily.        Marland Kitchen ezetimibe-simvastatin (VYTORIN) 10-20 MG per tablet Take 1 tablet by mouth at bedtime.        . fluticasone (FLONASE) 50 MCG/ACT nasal spray Place 1 spray into the nose daily.        . nitroGLYCERIN (NITROSTAT) 0.4 MG SL tablet Place 0.4 mg under the tongue every 5 (five) minutes as needed.         Scheduled:   . albuterol      . albuterol      . aspirin  81 mg Oral Daily  . enoxaparin  40 mg Subcutaneous Q24H  . fluticasone  1 spray Each Nare Daily  . furosemide  40 mg Intravenous BID  . insulin aspart  0-15 Units Subcutaneous TID WC  . insulin aspart  0-5 Units Subcutaneous QHS  . insulin aspart  15 Units Subcutaneous TID WC  . insulin detemir  50 Units Subcutaneous QHS  . insulin glargine      . ipratropium      . ipratropium      . ipratropium-albuterol  3 mL Nebulization Q4H WA  . levothyroxine  112 mcg Oral QAC breakfast  . lisinopril  20 mg Oral Daily  . metoprolol tartrate  50 mg Oral Daily  . pantoprazole  40 mg Oral BID AC  . predniSONE  10 mg Oral QAC breakfast  . rosuvastatin  40 mg Oral QHS  . sodium chloride  10 mL Intravenous Once  . tolterodine  4 mg Oral Daily  . vancomycin  1,000 mg Intravenous Once  . vancomycin  1,000 mg Intravenous Q12H  . DISCONTD: vancomycin  1,000 mg  Intravenous Q24H   Continuous:  ZOX:[id number], oxyCODONE-acetaminophen  Assesment: She has cellulitis of the leg. She has diabetes. She is adrenal insufficiency. She has COPD. She is being treated with intravenous vancomycin and will probably need a PICC line Active Problems:  *  No active hospital problems. *     Plan: I've asked for a PICC line tomorrow. We'll continue with her vancomycin and was already medications.    LOS: 1 day   Kohner Orlick L 02/10/2011, 7:52 AM

## 2011-02-11 ENCOUNTER — Encounter (HOSPITAL_COMMUNITY): Payer: Medicare Other

## 2011-02-11 ENCOUNTER — Inpatient Hospital Stay (HOSPITAL_COMMUNITY): Payer: Medicare Other

## 2011-02-11 DIAGNOSIS — L03116 Cellulitis of left lower limb: Secondary | ICD-10-CM | POA: Diagnosis present

## 2011-02-11 LAB — GLUCOSE, CAPILLARY
Glucose-Capillary: 100 mg/dL — ABNORMAL HIGH (ref 70–99)
Glucose-Capillary: 136 mg/dL — ABNORMAL HIGH (ref 70–99)

## 2011-02-11 LAB — VANCOMYCIN, TROUGH: Vancomycin Tr: 15.4 ug/mL (ref 10.0–20.0)

## 2011-02-11 MED ORDER — SODIUM CHLORIDE 0.9 % IJ SOLN
INTRAMUSCULAR | Status: AC
  Filled 2011-02-11: qty 3

## 2011-02-11 MED ORDER — SODIUM CHLORIDE 0.9 % IJ SOLN: 10.0000 mL | INTRAMUSCULAR | Status: DC | PRN

## 2011-02-11 MED ORDER — SODIUM CHLORIDE 0.9 % IJ SOLN
INTRAMUSCULAR | Status: AC
  Filled 2011-02-11: qty 10

## 2011-02-11 MED ORDER — DEXTROSE 5 % IV SOLN
1.0000 g | INTRAVENOUS | Status: DC
  Administered 2011-02-11: 1 g via INTRAVENOUS
  Filled 2011-02-11 (×2): qty 1

## 2011-02-11 MED ORDER — SODIUM CHLORIDE 0.9 % IJ SOLN
10.0000 mL | Freq: Two times a day (BID) | INTRAMUSCULAR | Status: DC
  Administered 2011-02-11 – 2011-02-13 (×4): 10 mL
  Filled 2011-02-11 (×4): qty 10

## 2011-02-11 MED ORDER — DEXTROSE 5 % IV SOLN
INTRAVENOUS | Status: AC
  Filled 2011-02-11: qty 1

## 2011-02-11 MED ORDER — SODIUM CHLORIDE 0.9 % IJ SOLN
INTRAMUSCULAR | Status: AC
  Administered 2011-02-11: 10 mL
  Filled 2011-02-11: qty 10

## 2011-02-11 NOTE — Progress Notes (Signed)
Subjective: She says she feels better. It appears that the erythema has spread slightly up her leg but her leg is less warm. She has been afebrile.  Objective: Vital signs in last 24 hours: Temp:  [97.9 F (36.6 C)-98 F (36.7 C)] 98 F (36.7 C) (09/17 1610) Pulse Rate:  [55-68] 61  (09/17 0608) Resp:  [20] 20  (09/17 0608) BP: (123-149)/(77-89) 136/85 mmHg (09/17 0608) SpO2:  [93 %-97 %] 94 % (09/17 0709) Weight change:  Last BM Date: 02/09/11  Intake/Output from previous day: 09/16 0701 - 09/17 0700 In: 1740 [P.O.:1740] Out: 3750 [Urine:3750]  PHYSICAL EXAM General appearance: alert, cooperative and morbidly obese Resp: clear to auscultation bilaterally Cardio: regular rate and rhythm, S1, S2 normal, no murmur, click, rub or gallop GI: soft, non-tender; bowel sounds normal; no masses,  no organomegaly Extremities: venous stasis dermatitis noted  Lab Results:    Basic Metabolic Panel:  Basename 02/10/11 0446 02/09/11 1240  NA 138 137  K 3.6 4.2  CL 104 102  CO2 29 28  GLUCOSE 120* 147*  BUN 18 20  CREATININE 0.85 0.90  CALCIUM 8.5 8.8  MG -- --  PHOS -- --   Liver Function Tests:  Basename 02/09/11 1240  AST 15  ALT 26  ALKPHOS 80  BILITOT 0.3  PROT 6.3  ALBUMIN 3.0*   No results found for this basename: LIPASE:2,AMYLASE:2 in the last 72 hours No results found for this basename: AMMONIA:2 in the last 72 hours CBC:  Basename 02/10/11 0446 02/09/11 1240  WBC 7.4 9.1  NEUTROABS 4.4 7.2  HGB 10.9* 11.7*  HCT 33.6* 36.1  MCV 91.1 91.2  PLT 185 187   Cardiac Enzymes: No results found for this basename: CKTOTAL:3,CKMB:3,CKMBINDEX:3,TROPONINI:3 in the last 72 hours BNP: No results found for this basename: POCBNP:3 in the last 72 hours D-Dimer: No results found for this basename: DDIMER:2 in the last 72 hours CBG:  Basename 02/11/11 0741 02/10/11 1700 02/10/11 0759 02/09/11 2042 02/09/11 1644 02/09/11 1410  GLUCAP 102* 176* 85 126* 170* 127*    Hemoglobin A1C: No results found for this basename: HGBA1C in the last 72 hours Fasting Lipid Panel: No results found for this basename: CHOL,HDL,LDLCALC,TRIG,CHOLHDL,LDLDIRECT in the last 72 hours Thyroid Function Tests: No results found for this basename: TSH,T4TOTAL,FREET4,T3FREE,THYROIDAB in the last 72 hours Anemia Panel: No results found for this basename: VITAMINB12,FOLATE,FERRITIN,TIBC,IRON,RETICCTPCT in the last 72 hours Urine Drug Screen:  Alcohol Level: No results found for this basename: ETH:2 in the last 72 hours Urinalysis:  Misc. Labs:  ABGS No results found for this basename: PHART,PCO2,PO2ART,TCO2,HCO3 in the last 72 hours CULTURES No results found for this or any previous visit (from the past 240 hour(s)). Studies/Results: No results found.  Medications:  Scheduled:   . albuterol  2.5 mg Nebulization Q4H while awake   And  . ipratropium  0.5 mg Nebulization Q4H while awake  . aspirin  81 mg Oral Daily  . enoxaparin  40 mg Subcutaneous Q24H  . fluticasone  1 spray Each Nare Daily  . furosemide  40 mg Intravenous BID  . insulin aspart  0-15 Units Subcutaneous TID WC  . insulin aspart  0-5 Units Subcutaneous QHS  . insulin aspart  15 Units Subcutaneous TID WC  . insulin glargine  50 Units Subcutaneous QHS  . levothyroxine  112 mcg Oral QAC breakfast  . lisinopril  20 mg Oral Daily  . metoprolol tartrate  50 mg Oral Daily  . pantoprazole  40 mg Oral  BID AC  . predniSONE  10 mg Oral QAC breakfast  . rosuvastatin  40 mg Oral QHS  . sodium chloride      . sodium chloride      . sodium chloride      . tolterodine  4 mg Oral Daily  . vancomycin  1,000 mg Intravenous Q12H  . DISCONTD: insulin detemir  50 Units Subcutaneous QHS  . DISCONTD: ipratropium-albuterol  3 mL Nebulization Q4H WA   Continuous:  UXL:[id number], oxyCODONE-acetaminophen, DISCONTD: nitroGLYCERIN  Assesment: She has cellulitis of both legs. She's been treated with vancomycin and  I think is overall improved. She has multiple other medical problems including diabetes adrenal insufficiency COPD coronary artery occlusive disease morbid obesity Active Problems:  * No active hospital problems. *     Plan: I would like to get a PICC line with the anticipation that she may be able to finish her antibiotics as an outpatient    LOS: 2 days   Almus Woodham L 02/11/2011, 7:56 AM

## 2011-02-11 NOTE — Progress Notes (Signed)
UR Chart Review Completed  

## 2011-02-12 LAB — GLUCOSE, CAPILLARY: Glucose-Capillary: 163 mg/dL — ABNORMAL HIGH (ref 70–99)

## 2011-02-12 MED ORDER — SODIUM CHLORIDE 0.9 % IJ SOLN
INTRAMUSCULAR | Status: AC
  Filled 2011-02-12: qty 10

## 2011-02-12 NOTE — Progress Notes (Signed)
Subjective: She says she's feeling better. She's not having much pain in her leg. Her leg is not as warm. She did get a PICC line yesterday  Objective: Vital signs in last 24 hours: Temp:  [97.4 F (36.3 C)] 97.4 F (36.3 C) (09/18 0535) Pulse Rate:  [55-59] 55  (09/18 0535) Resp:  [22] 22  (09/18 0535) BP: (115-146)/(73-94) 146/94 mmHg (09/18 0535) SpO2:  [92 %-97 %] 95 % (09/18 1448) Weight change:  Last BM Date: 02/11/11  Intake/Output from previous day: 09/17 0701 - 09/18 0700 In: 1316 [P.O.:240; I.V.:10; IV Piggyback:1066] Out: 800 [Urine:800]  PHYSICAL EXAM General appearance: alert, cooperative, no distress and morbidly obese Resp: clear to auscultation bilaterally Cardio: regular rate and rhythm, S1, S2 normal, no murmur, click, rub or gallop GI: soft, non-tender; bowel sounds normal; no masses,  no organomegaly Extremities: She still has erythema of her left lower extremity but it is less and her extremity is less warm than before  Lab Results:    Basic Metabolic Panel:  Basename 02/10/11 0446  NA 138  K 3.6  CL 104  CO2 29  GLUCOSE 120*  BUN 18  CREATININE 0.85  CALCIUM 8.5  MG --  PHOS --   Liver Function Tests: No results found for this basename: AST:2,ALT:2,ALKPHOS:2,BILITOT:2,PROT:2,ALBUMIN:2 in the last 72 hours No results found for this basename: LIPASE:2,AMYLASE:2 in the last 72 hours No results found for this basename: AMMONIA:2 in the last 72 hours CBC:  Basename 02/10/11 0446  WBC 7.4  NEUTROABS 4.4  HGB 10.9*  HCT 33.6*  MCV 91.1  PLT 185   Cardiac Enzymes: No results found for this basename: CKTOTAL:3,CKMB:3,CKMBINDEX:3,TROPONINI:3 in the last 72 hours BNP: No results found for this basename: POCBNP:3 in the last 72 hours D-Dimer: No results found for this basename: DDIMER:2 in the last 72 hours CBG:  Basename 02/12/11 1659 02/12/11 1202 02/12/11 0732 02/11/11 2019 02/11/11 1657 02/11/11 1144  GLUCAP 97 163* 78 136* 100* 111*     Hemoglobin A1C: No results found for this basename: HGBA1C in the last 72 hours Fasting Lipid Panel: No results found for this basename: CHOL,HDL,LDLCALC,TRIG,CHOLHDL,LDLDIRECT in the last 72 hours Thyroid Function Tests: No results found for this basename: TSH,T4TOTAL,FREET4,T3FREE,THYROIDAB in the last 72 hours Anemia Panel: No results found for this basename: VITAMINB12,FOLATE,FERRITIN,TIBC,IRON,RETICCTPCT in the last 72 hours Urine Drug Screen:  Alcohol Level: No results found for this basename: ETH:2 in the last 72 hours Urinalysis:  Misc. Labs:  ABGS No results found for this basename: PHART,PCO2,PO2ART,TCO2,HCO3 in the last 72 hours CULTURES No results found for this or any previous visit (from the past 240 hour(s)). Studies/Results: Chest Portable 1 View Post Insertion To Confirm Placement As Interpreted By Radiologist  02/11/2011  *RADIOLOGY REPORT*  Clinical Data: PICC line placement  PORTABLE CHEST - 1 VIEW  Comparison: Portable exam 1125 hours compared to 01/16/2011  Findings: Left arm PICC line, tip projects over SVC. Enlargement of cardiac silhouette. Mediastinal contours and pulmonary vascularity normal. Question minimal atelectasis at right base. Remaining lungs clear. No pleural effusion or pneumothorax. Prior cervical spine fusion.  IMPRESSION: Tip of left arm PICC line projects over SVC. Enlargement of cardiac silhouette. Question minimal atelectasis at right base.  Original Report Authenticated By: Lollie Marrow, M.D.    Medications:  Prior to Admission:  Prescriptions prior to admission  Medication Sig Dispense Refill  . aspirin 81 MG tablet Take 81 mg by mouth daily.        Marland Kitchen doxycycline (VIBRA-TABS)  100 MG tablet Take 100 mg by mouth 2 (two) times daily. Started on 02-05-11 for 10 days       . esomeprazole (NEXIUM) 40 MG capsule Take 40 mg by mouth 2 (two) times daily.       . fludrocortisone (FLORINEF) 0.1 MG tablet Take 0.1 mg by mouth daily.        .  Fluticasone-Salmeterol (ADVAIR) 250-50 MCG/DOSE AEPB Inhale 1 puff into the lungs every 12 (twelve) hours.        . furosemide (LASIX) 40 MG tablet Take 40 mg by mouth daily.        . insulin aspart (NOVOLOG FLEXPEN) 100 UNIT/ML injection Inject 20-27 Units into the skin 3 (three) times daily as needed. Only for levels <above 90       . insulin detemir (LEVEMIR) 100 UNIT/ML injection Inject 50 Units into the skin at bedtime.       Marland Kitchen levothyroxine (SYNTHROID, LEVOTHROID) 112 MCG tablet Take 112 mcg by mouth daily.        Marland Kitchen linagliptin (TRADJENTA) 5 MG TABS tablet Take 5 mg by mouth daily.        . metoprolol succinate (TOPROL-XL) 25 MG 24 hr tablet Take 50 mg by mouth daily.        . mupirocin (BACTROBAN) 2 % ointment Apply 1 application topically 2 (two) times daily. Affected area       . predniSONE (DELTASONE) 10 MG tablet Take 10 mg by mouth daily.        . quinapril (ACCUPRIL) 40 MG tablet Take 20 mg by mouth every morning.       . rosuvastatin (CRESTOR) 40 MG tablet Take 40 mg by mouth at bedtime.       . tolterodine (DETROL LA) 4 MG 24 hr capsule Take 4 mg by mouth daily.        Marland Kitchen ezetimibe-simvastatin (VYTORIN) 10-20 MG per tablet Take 1 tablet by mouth at bedtime.        . fluticasone (FLONASE) 50 MCG/ACT nasal spray Place 1 spray into the nose daily.        . nitroGLYCERIN (NITROSTAT) 0.4 MG SL tablet Place 0.4 mg under the tongue every 5 (five) minutes as needed.         Scheduled:   . albuterol  2.5 mg Nebulization Q4H while awake   And  . ipratropium  0.5 mg Nebulization Q4H while awake  . aspirin  81 mg Oral Daily  . cefTRIAXone (ROCEPHIN) IV  1 g Intravenous Q24H  . enoxaparin  40 mg Subcutaneous Q24H  . fluticasone  1 spray Each Nare Daily  . furosemide  40 mg Intravenous BID  . insulin aspart  0-15 Units Subcutaneous TID WC  . insulin aspart  0-5 Units Subcutaneous QHS  . insulin aspart  15 Units Subcutaneous TID WC  . insulin glargine  50 Units Subcutaneous QHS  .  levothyroxine  112 mcg Oral QAC breakfast  . lisinopril  20 mg Oral Daily  . metoprolol tartrate  50 mg Oral Daily  . pantoprazole  40 mg Oral BID AC  . predniSONE  10 mg Oral QAC breakfast  . rosuvastatin  40 mg Oral QHS  . sodium chloride  10 mL Intracatheter Q12H  . sodium chloride      . sodium chloride      . tolterodine  4 mg Oral Daily  . vancomycin  1,000 mg Intravenous Q12H   Continuous:  WUJ:[id number], oxyCODONE-acetaminophen, sodium chloride  Assesment: She is much improved. She's doing much better and I think she'll probably be ready for discharge tomorrow. She does have a PICC line placed in she's been receiving IV antibiotics. Her blood sugar has been okay she has not had any chest pain in her breathing is doing well Principal Problem:  *Cellulitis of leg without foot, left Active Problems:  HYPOTHYROIDISM  HYPERLIPIDEMIA  OVERWEIGHT/OBESITY  HYPERTENSION  CAD  GASTROESOPHAGEAL REFLUX DISEASE  RENAL FAILURE, CHRONIC  Diabetes mellitus    Plan: Continue IV antibiotics and plan for probable discharge tomorrow    LOS: 3 days   Cailee Blanke L 02/12/2011, 6:35 PM
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
## 2011-02-13 DIAGNOSIS — Z452 Encounter for adjustment and management of vascular access device: Secondary | ICD-10-CM

## 2011-02-13 HISTORY — DX: Encounter for adjustment and management of vascular access device: Z45.2

## 2011-02-13 LAB — GLUCOSE, CAPILLARY: Glucose-Capillary: 116 mg/dL — ABNORMAL HIGH (ref 70–99)

## 2011-02-13 MED ORDER — DEXTROSE 5 % IV SOLN: 1.0000 g | INTRAVENOUS | Status: DC

## 2011-02-13 MED ORDER — HEPARIN SOD (PORK) LOCK FLUSH 100 UNIT/ML IV SOLN
500.0000 [IU] | Freq: Once | INTRAVENOUS | Status: AC
  Administered 2011-02-13: 500 [IU] via INTRAVENOUS
  Filled 2011-02-13: qty 5

## 2011-02-13 MED ORDER — VANCOMYCIN HCL 1000 MG IV SOLR: 2000.0000 mg | INTRAVENOUS | Status: DC

## 2011-02-13 NOTE — Progress Notes (Signed)
Subjective: She says she feels okay. She has no new complaints. Her leg is doing well.  Objective: Vital signs in last 24 hours: Temp:  [97.9 F (36.6 C)-98.1 F (36.7 C)] 98.1 F (36.7 C) (09/19 0628) Pulse Rate:  [60-63] 63  (09/19 0628) Resp:  [20-22] 22  (09/19 0628) BP: (128-148)/(72-86) 148/86 mmHg (09/19 0628) SpO2:  [92 %-97 %] 97 % (09/19 0658) Weight change:  Last BM Date: 02/11/11  Intake/Output from previous day: 09/18 0701 - 09/19 0700 In: 388 [P.O.:180; IV Piggyback:208] Out: 1000 [Urine:1000]  PHYSICAL EXAM General appearance: alert, cooperative and morbidly obese Resp: clear to auscultation bilaterally Cardio: regular rate and rhythm, S1, S2 normal, no murmur, click, rub or gallop GI: soft, non-tender; bowel sounds normal; no masses,  no organomegaly  Lab Results:    Basic Metabolic Panel: No results found for this basename: NA:2,K:2,CL:2,CO2:2,GLUCOSE:2,BUN:2,CREATININE:2,CALCIUM:2,MG:2,PHOS:2 in the last 72 hours Liver Function Tests: No results found for this basename: AST:2,ALT:2,ALKPHOS:2,BILITOT:2,PROT:2,ALBUMIN:2 in the last 72 hours No results found for this basename: LIPASE:2,AMYLASE:2 in the last 72 hours No results found for this basename: AMMONIA:2 in the last 72 hours CBC: No results found for this basename: WBC:2,NEUTROABS:2,HGB:2,HCT:2,MCV:2,PLT:2 in the last 72 hours Cardiac Enzymes: No results found for this basename: CKTOTAL:3,CKMB:3,CKMBINDEX:3,TROPONINI:3 in the last 72 hours BNP: No results found for this basename: POCBNP:3 in the last 72 hours D-Dimer: No results found for this basename: DDIMER:2 in the last 72 hours CBG:  Basename 02/13/11 0716 02/12/11 2147 02/12/11 1659 02/12/11 1202 02/12/11 0732 02/11/11 2019  GLUCAP 116* 81 97 163* 78 136*   Hemoglobin A1C: No results found for this basename: HGBA1C in the last 72 hours Fasting Lipid Panel: No results found for this basename: CHOL,HDL,LDLCALC,TRIG,CHOLHDL,LDLDIRECT in  the last 72 hours Thyroid Function Tests: No results found for this basename: TSH,T4TOTAL,FREET4,T3FREE,THYROIDAB in the last 72 hours Anemia Panel: No results found for this basename: VITAMINB12,FOLATE,FERRITIN,TIBC,IRON,RETICCTPCT in the last 72 hours Urine Drug Screen:  Alcohol Level: No results found for this basename: ETH:2 in the last 72 hours Urinalysis:  Misc. Labs:  ABGS No results found for this basename: PHART,PCO2,PO2ART,TCO2,HCO3 in the last 72 hours CULTURES No results found for this or any previous visit (from the past 240 hour(s)). Studies/Results: Chest Portable 1 View Post Insertion To Confirm Placement As Interpreted By Radiologist  02/11/2011  *RADIOLOGY REPORT*  Clinical Data: PICC line placement  PORTABLE CHEST - 1 VIEW  Comparison: Portable exam 1125 hours compared to 01/16/2011  Findings: Left arm PICC line, tip projects over SVC. Enlargement of cardiac silhouette. Mediastinal contours and pulmonary vascularity normal. Question minimal atelectasis at right base. Remaining lungs clear. No pleural effusion or pneumothorax. Prior cervical spine fusion.  IMPRESSION: Tip of left arm PICC line projects over SVC. Enlargement of cardiac silhouette. Question minimal atelectasis at right base.  Original Report Authenticated By: Lollie Marrow, M.D.    Medications:  Prior to Admission:  Prescriptions prior to admission  Medication Sig Dispense Refill  . aspirin 81 MG tablet Take 81 mg by mouth daily.        Marland Kitchen doxycycline (VIBRA-TABS) 100 MG tablet Take 100 mg by mouth 2 (two) times daily. Started on 02-05-11 for 10 days       . esomeprazole (NEXIUM) 40 MG capsule Take 40 mg by mouth 2 (two) times daily.       . fludrocortisone (FLORINEF) 0.1 MG tablet Take 0.1 mg by mouth daily.        . Fluticasone-Salmeterol (ADVAIR) 250-50 MCG/DOSE AEPB  Inhale 1 puff into the lungs every 12 (twelve) hours.        . furosemide (LASIX) 40 MG tablet Take 40 mg by mouth daily.        .  insulin aspart (NOVOLOG FLEXPEN) 100 UNIT/ML injection Inject 20-27 Units into the skin 3 (three) times daily as needed. Only for levels <above 90       . insulin detemir (LEVEMIR) 100 UNIT/ML injection Inject 50 Units into the skin at bedtime.       Marland Kitchen levothyroxine (SYNTHROID, LEVOTHROID) 112 MCG tablet Take 112 mcg by mouth daily.        Marland Kitchen linagliptin (TRADJENTA) 5 MG TABS tablet Take 5 mg by mouth daily.        . metoprolol succinate (TOPROL-XL) 25 MG 24 hr tablet Take 50 mg by mouth daily.        . mupirocin (BACTROBAN) 2 % ointment Apply 1 application topically 2 (two) times daily. Affected area       . predniSONE (DELTASONE) 10 MG tablet Take 10 mg by mouth daily.        . quinapril (ACCUPRIL) 40 MG tablet Take 20 mg by mouth every morning.       . rosuvastatin (CRESTOR) 40 MG tablet Take 40 mg by mouth at bedtime.       . tolterodine (DETROL LA) 4 MG 24 hr capsule Take 4 mg by mouth daily.        Marland Kitchen ezetimibe-simvastatin (VYTORIN) 10-20 MG per tablet Take 1 tablet by mouth at bedtime.        . fluticasone (FLONASE) 50 MCG/ACT nasal spray Place 1 spray into the nose daily.        . nitroGLYCERIN (NITROSTAT) 0.4 MG SL tablet Place 0.4 mg under the tongue every 5 (five) minutes as needed.         Scheduled:   . albuterol  2.5 mg Nebulization Q4H while awake   And  . ipratropium  0.5 mg Nebulization Q4H while awake  . aspirin  81 mg Oral Daily  . cefTRIAXone (ROCEPHIN) IV  1 g Intravenous Q24H  . enoxaparin  40 mg Subcutaneous Q24H  . fluticasone  1 spray Each Nare Daily  . furosemide  40 mg Intravenous BID  . insulin aspart  0-15 Units Subcutaneous TID WC  . insulin aspart  0-5 Units Subcutaneous QHS  . insulin aspart  15 Units Subcutaneous TID WC  . insulin glargine  50 Units Subcutaneous QHS  . levothyroxine  112 mcg Oral QAC breakfast  . lisinopril  20 mg Oral Daily  . metoprolol tartrate  50 mg Oral Daily  . pantoprazole  40 mg Oral BID AC  . predniSONE  10 mg Oral QAC  breakfast  . rosuvastatin  40 mg Oral QHS  . sodium chloride  10 mL Intracatheter Q12H  . sodium chloride      . tolterodine  4 mg Oral Daily  . vancomycin  1,000 mg Intravenous Q12H   Continuous:  [id number], oxyCODONE-acetaminophen, sodium chloride  Assesment: She has cellulitis of the leg but she is much improved. She will be discharged home today on IV antibiotics Principal Problem:  *Cellulitis of leg without foot, left Active Problems:  HYPOTHYROIDISM  HYPERLIPIDEMIA  OVERWEIGHT/OBESITY  HYPERTENSION  CAD  GASTROESOPHAGEAL REFLUX DISEASE  RENAL FAILURE, CHRONIC  Diabetes mellitus    Plan: She will be discharged home today with home health and with IV antibiotics please see discharge summary for details  LOS: 4 days   Katherin Ramey L 02/13/2011, 8:48 AM

## 2011-02-17 NOTE — Discharge Summary (Signed)
Physician Discharge Summary  Patient ID: Ann [name] MRN: [id number] DOB/AGE: 12/31/1945 65 y.o. Primary Care Physician:Leela Vanbrocklin L, MD Admit date: 02/09/2011 Discharge date: 02/17/2011    Discharge Diagnoses:   Principal Problem:  *Cellulitis of leg without foot, left Active Problems:  HYPOTHYROIDISM  HYPERLIPIDEMIA  OVERWEIGHT/OBESITY  HYPERTENSION  CAD  GASTROESOPHAGEAL REFLUX DISEASE  RENAL FAILURE, CHRONIC  Diabetes mellitus   Discharge Medication List as of 02/13/2011 10:53 AM    START taking these medications   Details  dextrose 5 % SOLN 50 mL with cefTRIAXone 1 G SOLR 1 g Inject 1 g into the vein daily., Starting 02/13/2011, Until Discontinued, Print    sodium chloride 0.9 % SOLN 500 mL with vancomycin 1000 MG SOLR 2,000 mg Inject 2,000 mg into the vein daily., Starting 02/13/2011, Until Discontinued, Print      CONTINUE these medications which have NOT CHANGED   Details  aspirin 81 MG tablet Take 81 mg by mouth daily.  , Until Discontinued, Historical Med    doxycycline (VIBRA-TABS) 100 MG tablet Take 100 mg by mouth 2 (two) times daily. Started on 02-05-11 for 10 days , Until Discontinued, Historical Med    esomeprazole (NEXIUM) 40 MG capsule Take 40 mg by mouth 2 (two) times daily. , Until Discontinued, Historical Med    fludrocortisone (FLORINEF) 0.1 MG tablet Take 0.1 mg by mouth daily.  , Until Discontinued, Historical Med    Fluticasone-Salmeterol (ADVAIR) 250-50 MCG/DOSE AEPB Inhale 1 puff into the lungs every 12 (twelve) hours.  , Until Discontinued, Historical Med    furosemide (LASIX) 40 MG tablet Take 40 mg by mouth daily.  , Until Discontinued, Historical Med    insulin aspart (NOVOLOG FLEXPEN) 100 UNIT/ML injection Inject 20-27 Units into the skin 3 (three) times daily as needed. Only for levels <above 90 , Until Discontinued, Historical Med    insulin detemir (LEVEMIR) 100 UNIT/ML injection Inject 50 Units into the skin at bedtime. , Until  Discontinued, Historical Med    levothyroxine (SYNTHROID, LEVOTHROID) 112 MCG tablet Take 112 mcg by mouth daily.  , Until Discontinued, Historical Med    linagliptin (TRADJENTA) 5 MG TABS tablet Take 5 mg by mouth daily.  , Until Discontinued, Historical Med    metoprolol succinate (TOPROL-XL) 25 MG 24 hr tablet Take 50 mg by mouth daily.  , Until Discontinued, Historical Med    mupirocin (BACTROBAN) 2 % ointment Apply 1 application topically 2 (two) times daily. Affected area , Until Discontinued, Historical Med    predniSONE (DELTASONE) 10 MG tablet Take 10 mg by mouth daily.  , Until Discontinued, Historical Med    quinapril (ACCUPRIL) 40 MG tablet Take 20 mg by mouth every morning. , Until Discontinued, Historical Med    rosuvastatin (CRESTOR) 40 MG tablet Take 40 mg by mouth at bedtime. , Until Discontinued, Historical Med    tolterodine (DETROL LA) 4 MG 24 hr capsule Take 4 mg by mouth daily.  , Until Discontinued, Historical Med    fluticasone (FLONASE) 50 MCG/ACT nasal spray Place 1 spray into the nose daily.  , Until Discontinued, Historical Med    nitroGLYCERIN (NITROSTAT) 0.4 MG SL tablet Place 0.4 mg under the tongue every 5 (five) minutes as needed.  , Until Discontinued, Historical Med      STOP taking these medications     ezetimibe-simvastatin (VYTORIN) 10-20 MG per tablet         Discharged Condition: Improved    Consults: None  Significant Diagnostic Studies:  Chest Portable 1 View Post Insertion To Confirm Placement As Interpreted By Radiologist  02/11/2011  *RADIOLOGY REPORT*  Clinical Data: PICC line placement  PORTABLE CHEST - 1 VIEW  Comparison: Portable exam 1125 hours compared to 01/16/2011  Findings: Left arm PICC line, tip projects over SVC. Enlargement of cardiac silhouette. Mediastinal contours and pulmonary vascularity normal. Question minimal atelectasis at right base. Remaining lungs clear. No pleural effusion or pneumothorax. Prior cervical spine  fusion.  IMPRESSION: Tip of left arm PICC line projects over SVC. Enlargement of cardiac silhouette. Question minimal atelectasis at right base.  Original Report Authenticated By: Lollie Marrow, M.D.    Lab Results: No results found for this or any previous visit (from the past 48 hour(s)). No results found for this or any previous visit (from the past 240 hour(s)).   Hospital Course: She was treated with intravenous antibiotics. She had PICC line placed so that she could finish her antibiotics as an outpatient. By the time of discharge her leg was much better with less swelling and less erythema  Discharge Exam: Blood pressure 148/86, pulse 63, temperature 98.1 F (36.7 C), temperature source Oral, resp. rate 22, height 5\' 4"  (1.626 m), weight 127.9 kg (281 lb 15.5 oz), SpO2 97.00%. Her leg is less swollen less erythematous and seems much better  Disposition: Home with home health and IV antibiotics for 10 more days  Discharge Orders    Future Orders Please Complete By Expires   Home Health      Questions: Responses:   To provide the following care/treatments RN   Face-to-face encounter      Comments:   I Danaisha Celli L certify that this patient is under my care and that I, or a nurse practitioner or physician's assistant working with me, had a face-to-face encounter that meets the physician face-to-face encounter requirements with this patient on 02/13/2011.       Questions: Responses:   The encounter with the patient was in whole, or in part, for the following medical condition, which is the primary reason for home health care cellulitis of leg   I certify that, based on my findings, the following services are medically necessary home health services Nursing   My clinical findings support the need for the above services Infection requiring IV antibiotics   Further, I certify that my clinical findings support that this patient is homebound (i.e. absences from home require  considerable and taxing effort and are for medical reasons or religious services or infrequently or of short duration when for other reasons) Ambulates short distances less than 300 feet   To provide the following care/treatments RN      Follow-up Information    Follow up with Tristen Pennino L on 02/28/2011. (appointment time at  1130)    Contact information:   [address] [place] [address] [phone]       Follow up with [facility] (home health nurse)    Contact information:   [phone]         Signed: Fredirick Maudlin 02/17/2011, 9:26 AM

## 2011-02-20 LAB — URINALYSIS, ROUTINE W REFLEX MICROSCOPIC
Bilirubin Urine: NEGATIVE
Glucose, UA: NEGATIVE
Specific Gravity, Urine: <1.005 — ABNORMAL LOW
Urobilinogen, UA: 0.2

## 2011-02-20 LAB — URINE MICROSCOPIC-ADD ON

## 2011-02-26 LAB — DIFFERENTIAL
Lymphocytes Relative: 30
Lymphs Abs: 2.4
Monocytes Absolute: 0.7
Monocytes Relative: 9
Neutro Abs: 4.7
Neutrophils Relative %: 59

## 2011-02-26 LAB — CARDIAC PANEL(CRET KIN+CKTOT+MB+TROPI)
CK, MB: 3.6
Relative Index: 3 — ABNORMAL HIGH
Total CK: 119

## 2011-02-26 LAB — CBC
Hemoglobin: 12.7
MCHC: 34.2
RBC: 4.18
WBC: 8

## 2011-02-26 LAB — POCT CARDIAC MARKERS
CKMB, poc: 3.1
CKMB, poc: 3.2
Myoglobin, poc: 56.4
Troponin i, poc: <0.05

## 2011-02-26 LAB — BASIC METABOLIC PANEL
CO2: 26
Calcium: 8.8
Creatinine, Ser: 0.85
GFR calc Af Amer: >60
GFR calc non Af Amer: >60
Sodium: 134 — ABNORMAL LOW

## 2011-02-26 LAB — APTT: aPTT: 29

## 2011-02-26 LAB — PROTIME-INR: INR: 0.9

## 2011-02-27 LAB — CBC
HCT: 36.8
HCT: 38.3
HCT: 38.5
Hemoglobin: 12.4
Hemoglobin: 12.6
Hemoglobin: 12.8
Hemoglobin: 13
MCHC: 33.7
MCHC: 33.8
MCV: 89.9
MCV: 90.1
MCV: 90.7
MCV: 90.9
Platelets: 196
Platelets: 198
RBC: 4.08
RBC: 4.15
RBC: 4.19
RDW: 12.6
RDW: 12.8
WBC: 6.4
WBC: 6.9
WBC: 7.4
WBC: 7.8

## 2011-02-27 LAB — LIPID PANEL
Cholesterol: 162
LDL Cholesterol: 119 — ABNORMAL HIGH
Total CHOL/HDL Ratio: 7.4
Triglycerides: 107

## 2011-02-27 LAB — BASIC METABOLIC PANEL
BUN: 11
Chloride: 106
GFR calc non Af Amer: >60
Glucose, Bld: 127 — ABNORMAL HIGH
Potassium: 3.7
Sodium: 137

## 2011-02-27 LAB — HEPARIN LEVEL (UNFRACTIONATED): Heparin Unfractionated: 0.73 — ABNORMAL HIGH

## 2011-02-27 LAB — COMPREHENSIVE METABOLIC PANEL
AST: 24
Albumin: 3.3 — ABNORMAL LOW
CO2: 24
Calcium: 8.5
Creatinine, Ser: 0.7
GFR calc Af Amer: >60
GFR calc non Af Amer: >60
Total Protein: 6.4

## 2011-02-27 LAB — D-DIMER, QUANTITATIVE
D-Dimer, Quant: 0.37
D-Dimer, Quant: 0.45

## 2011-02-27 LAB — CARDIAC PANEL(CRET KIN+CKTOT+MB+TROPI)
CK, MB: 3.7
Total CK: 146

## 2011-02-27 LAB — MAGNESIUM: Magnesium: 2.3

## 2011-02-27 LAB — PROTIME-INR: Prothrombin Time: 12.4

## 2011-02-28 NOTE — Patient Instructions (Signed)
20 Ann [name]  02/28/2011   Your procedure is scheduled on:  Tuesday, 03/05/11  Report to [facility] at 0900 AM.  Call this number if you have problems the morning of surgery: [phone]   Remember:   Do not eat food:After Midnight.  Do not drink clear liquids: After Midnight.  Take these medicines the morning of surgery with A SIP OF WATER: Nexium, quinapril, Toprol, levothyroxine. No insulin the morning of surgery and only take half of your levemir the night before surgery (25 units).   Do not wear jewelry, make-up or nail polish.  Do not wear lotions, powders, or perfumes. You may wear deodorant.  Do not shave 48 hours prior to surgery.  Do not bring valuables to the hospital.  Contacts, dentures or bridgework may not be worn into surgery.    Patients discharged the day of surgery will not be allowed to drive home.  Name and phone number of your driver: driver  Special Instructions: Use eye drops as instructed.  Please read over the following fact sheets that you were given: Anesthesia Post-op Instructions and Care and Recovery After Surgery   Monitored Anesthesia Care (MAC)  MAC stands for monitored anesthesia care. MAC usually means a tube is not put in your trachea (windpipe). MAC may also be called moderate sedation. MAC usually involves giving intravenous anesthetic drugs, oxygen, watching vital signs and standard patient monitoring procedures similar to those used during a general anesthetic. MAC can be done without going to the operating room. MAC is typically used for small procedures that cannot be done with only local anesthesia. MAC usually means lower doses of anesthetic drugs. The recovery period tends to be shorter. The drugs used cause a lower level of awareness. This means you are partially awake and your reflexes are intact. You may hear what is being said and feel some pressure, but should not feel pain. The drugs used may affect your ability to remember the  procedure. If you have depressed consciousness and lose some protective reflexes, this is called deep sedation. If you become unconscious and fall completely asleep, this is general anesthesia. In both deep sedation and general anesthesia, the caregivers must make sure that your airway remains open. During MAC, the sedation-trained caregivers will:  Give medications which may include:   Sedatives.   Analgesics.   Hypnotics.   Other medications which are needed to keep you comfortable, safe and secure.   Give local anesthetic to numb the procedural site.   Monitor your level of consciousness.   Monitor your blood pressure.   Monitor your heart rate and rhythm.   Monitor your respirations and oxygen levels.   Monitor your airway.   Monitor your level of pain.   Evaluate and treat problems which may occur.  Some of this information is from the [employer]. Document Released: 02/06/2005 Document Re-Released: 06/04/2009 [facility] Patient Information 2011 [place], [place].

## 2011-03-01 ENCOUNTER — Encounter (HOSPITAL_COMMUNITY): Payer: Self-pay

## 2011-03-01 ENCOUNTER — Encounter (HOSPITAL_COMMUNITY)
Admission: RE | Admit: 2011-03-01 | Discharge: 2011-03-01 | Disposition: A | Discharge status: Home / Self Care | Payer: Medicare Other | Source: Ambulatory Visit | Attending: Ophthalmology | Admitting: Ophthalmology

## 2011-03-01 HISTORY — DX: Type 2 diabetes mellitus without complications: E11.9

## 2011-03-01 HISTORY — DX: Encounter for adjustment and management of vascular access device: Z45.2

## 2011-03-01 HISTORY — DX: Cellulitis, unspecified: L03.90

## 2011-03-01 HISTORY — DX: Primary adrenocortical insufficiency: E27.1

## 2011-03-01 HISTORY — DX: Type 2 diabetes mellitus without complications: Z79.4

## 2011-03-01 LAB — BASIC METABOLIC PANEL
BUN: 22 mg/dL (ref 6–23)
CO2: 26 mEq/L (ref 19–32)
GFR calc non Af Amer: 61 mL/min — ABNORMAL LOW (ref >90)
Glucose, Bld: 68 mg/dL — ABNORMAL LOW (ref 70–99)
Potassium: 3.7 mEq/L (ref 3.5–5.1)

## 2011-03-01 LAB — CBC
HCT: 40.1 % (ref 36.0–46.0)
Hemoglobin: 12.8 g/dL (ref 12.0–15.0)
MCH: 28.8 pg (ref 26.0–34.0)
MCHC: 31.9 g/dL (ref 30.0–36.0)
RBC: 4.45 MIL/uL (ref 3.87–5.11)

## 2011-03-05 ENCOUNTER — Encounter (HOSPITAL_COMMUNITY): Payer: Self-pay | Admitting: Anesthesiology

## 2011-03-05 ENCOUNTER — Ambulatory Visit (HOSPITAL_COMMUNITY): Payer: Medicare Other | Admitting: Anesthesiology

## 2011-03-05 ENCOUNTER — Encounter (HOSPITAL_COMMUNITY)
Admission: RE | Disposition: A | Discharge status: Home / Self Care | Payer: Self-pay | Source: Ambulatory Visit | Attending: Ophthalmology

## 2011-03-05 ENCOUNTER — Ambulatory Visit (HOSPITAL_COMMUNITY)
Admission: RE | Admit: 2011-03-05 | Discharge: 2011-03-05 | Disposition: A | Discharge status: Home / Self Care | Payer: Medicare Other | Source: Ambulatory Visit | Attending: Ophthalmology | Admitting: Ophthalmology

## 2011-03-05 DIAGNOSIS — E119 Type 2 diabetes mellitus without complications: Secondary | ICD-10-CM | POA: Insufficient documentation

## 2011-03-05 DIAGNOSIS — J449 Chronic obstructive pulmonary disease, unspecified: Secondary | ICD-10-CM | POA: Insufficient documentation

## 2011-03-05 DIAGNOSIS — Z79899 Other long term (current) drug therapy: Secondary | ICD-10-CM | POA: Insufficient documentation

## 2011-03-05 DIAGNOSIS — Z794 Long term (current) use of insulin: Secondary | ICD-10-CM | POA: Insufficient documentation

## 2011-03-05 DIAGNOSIS — I10 Essential (primary) hypertension: Secondary | ICD-10-CM | POA: Insufficient documentation

## 2011-03-05 DIAGNOSIS — Z01812 Encounter for preprocedural laboratory examination: Secondary | ICD-10-CM | POA: Insufficient documentation

## 2011-03-05 DIAGNOSIS — J4489 Other specified chronic obstructive pulmonary disease: Secondary | ICD-10-CM | POA: Insufficient documentation

## 2011-03-05 DIAGNOSIS — H2510 Age-related nuclear cataract, unspecified eye: Secondary | ICD-10-CM | POA: Insufficient documentation

## 2011-03-05 HISTORY — PX: CATARACT EXTRACTION W/PHACO: SHX586

## 2011-03-05 LAB — GLUCOSE, CAPILLARY: Glucose-Capillary: 101 mg/dL — ABNORMAL HIGH (ref 70–99)

## 2011-03-05 SURGERY — PHACOEMULSIFICATION, CATARACT, WITH IOL INSERTION
Anesthesia: Monitor Anesthesia Care | Site: Eye | Laterality: Right | Wound class: Clean

## 2011-03-05 MED ORDER — PHENYLEPHRINE HCL 2.5 % OP SOLN
OPHTHALMIC | Status: AC
  Administered 2011-03-05: 1 [drp] via OPHTHALMIC
  Filled 2011-03-05: qty 2

## 2011-03-05 MED ORDER — BSS IO SOLN
INTRAOCULAR | Status: DC | PRN
  Administered 2011-03-05: 15 mL via OPHTHALMIC

## 2011-03-05 MED ORDER — FLURBIPROFEN SODIUM 0.03 % OP SOLN
1.0000 [drp] | OPHTHALMIC | Status: AC
  Administered 2011-03-05 (×3): 1 [drp] via OPHTHALMIC

## 2011-03-05 MED ORDER — LACTATED RINGERS IV SOLN
INTRAVENOUS | Status: DC
  Administered 2011-03-05: 10:00:00 via INTRAVENOUS

## 2011-03-05 MED ORDER — EPINEPHRINE HCL 1 MG/ML IJ SOLN
INTRAMUSCULAR | Status: AC
  Filled 2011-03-05: qty 1

## 2011-03-05 MED ORDER — PHENYLEPHRINE HCL 2.5 % OP SOLN
1.0000 [drp] | OPHTHALMIC | Status: AC
  Administered 2011-03-05 (×3): 1 [drp] via OPHTHALMIC

## 2011-03-05 MED ORDER — MIDAZOLAM HCL 2 MG/2ML IJ SOLN
1.0000 mg | INTRAMUSCULAR | Status: DC | PRN
  Administered 2011-03-05: 2 mg via INTRAVENOUS

## 2011-03-05 MED ORDER — CYCLOPENTOLATE-PHENYLEPHRINE 0.2-1 % OP SOLN
1.0000 [drp] | OPHTHALMIC | Status: AC
  Administered 2011-03-05 (×3): 1 [drp] via OPHTHALMIC

## 2011-03-05 MED ORDER — PROVISC 10 MG/ML IO SOLN
INTRAOCULAR | Status: DC | PRN
  Administered 2011-03-05: 8.5 mg via OPHTHALMIC

## 2011-03-05 MED ORDER — TETRACAINE HCL 0.5 % OP SOLN
OPHTHALMIC | Status: AC
  Administered 2011-03-05: 1 [drp] via OPHTHALMIC
  Filled 2011-03-05: qty 2

## 2011-03-05 MED ORDER — EPINEPHRINE HCL 1 MG/ML IJ SOLN
INTRAOCULAR | Status: DC | PRN
  Administered 2011-03-05: 11:00:00

## 2011-03-05 MED ORDER — TETRACAINE HCL 0.5 % OP SOLN
1.0000 [drp] | OPHTHALMIC | Status: AC
  Administered 2011-03-05 (×3): 1 [drp] via OPHTHALMIC

## 2011-03-05 MED ORDER — FLURBIPROFEN SODIUM 0.03 % OP SOLN
OPHTHALMIC | Status: AC
  Administered 2011-03-05: 1 [drp] via OPHTHALMIC
  Filled 2011-03-05: qty 2.5

## 2011-03-05 MED ORDER — MIDAZOLAM HCL 2 MG/2ML IJ SOLN
INTRAMUSCULAR | Status: AC
  Administered 2011-03-05: 2 mg via INTRAVENOUS
  Filled 2011-03-05: qty 2

## 2011-03-05 MED ORDER — LACTATED RINGERS IV SOLN: INTRAVENOUS | Status: DC

## 2011-03-05 MED ORDER — CYCLOPENTOLATE-PHENYLEPHRINE 0.2-1 % OP SOLN
OPHTHALMIC | Status: AC
  Administered 2011-03-05: 1 [drp] via OPHTHALMIC
  Filled 2011-03-05: qty 2

## 2011-03-05 SURGICAL SUPPLY — 25 items
CAPSULAR TENSION RING-AMO (OPHTHALMIC RELATED) IMPLANT
CLOTH BEACON ORANGE TIMEOUT ST (SAFETY) ×1 IMPLANT
DUOVISC SYSTEM (INTRAOCULAR LENS)
EYE SHIELD UNIVERSAL CLEAR (GAUZE/BANDAGES/DRESSINGS) ×1 IMPLANT
GLOVE BIOGEL M 6.5 STRL (GLOVE) IMPLANT
GLOVE ECLIPSE 6.5 STRL STRAW (GLOVE) IMPLANT
GLOVE ECLIPSE 7.0 STRL STRAW (GLOVE) IMPLANT
GLOVE EXAM NITRILE LRG STRL (GLOVE) IMPLANT
GLOVE EXAM NITRILE MD LF STRL (GLOVE) ×1 IMPLANT
GLOVE SKINSENSE NS SZ6.5 (GLOVE)
GLOVE SKINSENSE STRL SZ6.5 (GLOVE) IMPLANT
HEALON 5 0.6 ML (INTRAOCULAR LENS) IMPLANT
KIT VITRECTOMY (OPHTHALMIC RELATED) IMPLANT
PAD ARMBOARD 7.5X6 YLW CONV (MISCELLANEOUS) ×1 IMPLANT
PROC W NO LENS (INTRAOCULAR LENS)
PROC W SPEC LENS (INTRAOCULAR LENS)
PROCESS W NO LENS (INTRAOCULAR LENS) IMPLANT
PROCESS W SPEC LENS (INTRAOCULAR LENS) IMPLANT
RING MALYGIN (MISCELLANEOUS) IMPLANT
SIGHTPATH CAT PROC W REG LENS (Ophthalmic Related) ×2 IMPLANT
SYSTEM DUOVISC (INTRAOCULAR LENS) IMPLANT
TAPE SURG TRANSPORE 1 IN (GAUZE/BANDAGES/DRESSINGS) IMPLANT
TAPE SURGICAL TRANSPORE 1 IN (GAUZE/BANDAGES/DRESSINGS) ×1
VISCOELASTIC ADDITIONAL (OPHTHALMIC RELATED) IMPLANT
WATER STERILE IRR 250ML POUR (IV SOLUTION) ×1 IMPLANT

## 2011-03-05 NOTE — Transfer of Care (Signed)
Immediate Anesthesia Transfer of Care Note  Patient: Ann [name]  Procedure(s) Performed:  CATARACT EXTRACTION PHACO AND INTRAOCULAR LENS PLACEMENT (IOC) - CDE 5.75  Patient Location: PACU and Short Stay  Anesthesia Type: MAC  Level of Consciousness: awake, alert , oriented and patient cooperative  Airway & Oxygen Therapy: Patient Spontanous Breathing  Post-op Assessment: Report given to PACU RN, Post -op Vital signs reviewed and stable and Patient moving all extremities  Post vital signs: Reviewed and stable  Complications: No apparent anesthesia complications

## 2011-03-05 NOTE — H&P (Signed)
No change in H and P 

## 2011-03-05 NOTE — Anesthesia Preprocedure Evaluation (Addendum)
Anesthesia Evaluation  Name, MR# and DOB Patient awake  General Assessment Comment  Reviewed: Allergy & Precautions, H&P , NPO status , Patient's Chart, lab work & pertinent test results  History of Anesthesia Complications Negative for: history of anesthetic complications  Airway Mallampati: II      Dental  (+) Edentulous Upper   Pulmonary COPD COPD inhaler    + decreased breath sounds      Cardiovascular hypertension, - angina+ CAD and + Cardiac Stents Regular Normal    Neuro/Psych    GI/Hepatic GERD   Endo/Other  Diabetes mellitus-, Well Controlled, Type 2, Insulin DependentHypothyroidism Morbid obesity  Renal/GU      Musculoskeletal   Abdominal (+) obese,   Peds  Hematology   Anesthesia Other Findings   Reproductive/Obstetrics                           Anesthesia Physical Anesthesia Plan  ASA: III  Anesthesia Plan: MAC   Post-op Pain Management:    Induction:   Airway Management Planned: Nasal Cannula  Additional Equipment:   Intra-op Plan:   Post-operative Plan:   Informed Consent: I have reviewed the patients History and Physical, chart, labs and discussed the procedure including the risks, benefits and alternatives for the proposed anesthesia with the patient or authorized representative who has indicated his/her understanding and acceptance.     Plan Discussed with:   Anesthesia Plan Comments:         Anesthesia Quick Evaluation

## 2011-03-05 NOTE — Anesthesia Postprocedure Evaluation (Signed)
  Anesthesia Post-op Note  Patient: Ann [name]  Procedure(s) Performed:  CATARACT EXTRACTION PHACO AND INTRAOCULAR LENS PLACEMENT (IOC) - CDE 5.75  Patient Location: PACU and Short Stay  Anesthesia Type: MAC  Level of Consciousness: awake, alert , oriented and patient cooperative  Airway and Oxygen Therapy: Patient Spontanous Breathing  Post-op Pain: none  Post-op Assessment: Post-op Vital signs reviewed, Patient's Cardiovascular Status Stable, Respiratory Function Stable, Patent Airway and No signs of Nausea or vomiting  Post-op Vital Signs: Reviewed and stable  Complications: No apparent anesthesia complications

## 2011-03-05 NOTE — Op Note (Signed)
Patient brought to the operating room and prepped and draped in the usual manner.  Lid speculum inserted in right eye.  Stab incision made at the twelve o'clock position.  Provisc instilled in the anterior chamber.   A 2.4 mm. Stab incision was made temporally.  An anterior capsulotomy was done with a bent 25 gauge needle.  The nucleus was hydrodissected.  The Phaco tip was inserted in the anterior chamber and the nucleus was emulsified.  CDE was 5.75.  The cortical material was then removed with the I and A tip.  Posterior capsule was the polished.  The anterior chamber was deepened with Provisc.  A 23.5 Diopter Rayner 570C IOL was then inserted in the capsular bag.  Provisc was then removed with the I and A tip.  The wound was then hydrated.  Patient sent to the Recovery Room in good condition with follow up in my office.

## 2011-03-08 ENCOUNTER — Encounter (HOSPITAL_COMMUNITY): Payer: Self-pay | Admitting: Ophthalmology

## 2011-03-15 ENCOUNTER — Encounter (HOSPITAL_COMMUNITY)
Admission: RE | Admit: 2011-03-15 | Discharge: 2011-03-15 | Payer: Medicare Other | Source: Ambulatory Visit | Admitting: Ophthalmology

## 2011-03-15 MED ORDER — ONDANSETRON HCL 4 MG/2ML IJ SOLN: 4.0000 mg | Freq: Once | INTRAMUSCULAR | Status: DC | PRN

## 2011-03-15 MED ORDER — FENTANYL CITRATE 0.05 MG/ML IJ SOLN: 25.0000 ug | INTRAMUSCULAR | Status: DC | PRN

## 2011-03-19 ENCOUNTER — Ambulatory Visit (HOSPITAL_COMMUNITY): Payer: Medicare Other | Admitting: Anesthesiology

## 2011-03-19 ENCOUNTER — Ambulatory Visit (HOSPITAL_COMMUNITY)
Admission: RE | Admit: 2011-03-19 | Discharge: 2011-03-19 | Disposition: A | Discharge status: Home / Self Care | Payer: Medicare Other | Source: Ambulatory Visit | Attending: Ophthalmology | Admitting: Ophthalmology

## 2011-03-19 ENCOUNTER — Encounter (HOSPITAL_COMMUNITY)
Admission: RE | Disposition: A | Discharge status: Home / Self Care | Payer: Self-pay | Source: Ambulatory Visit | Attending: Ophthalmology

## 2011-03-19 ENCOUNTER — Encounter (HOSPITAL_COMMUNITY): Payer: Self-pay | Admitting: Anesthesiology

## 2011-03-19 ENCOUNTER — Encounter (HOSPITAL_COMMUNITY): Payer: Self-pay

## 2011-03-19 DIAGNOSIS — E7800 Pure hypercholesterolemia, unspecified: Secondary | ICD-10-CM | POA: Insufficient documentation

## 2011-03-19 DIAGNOSIS — Z7982 Long term (current) use of aspirin: Secondary | ICD-10-CM | POA: Insufficient documentation

## 2011-03-19 DIAGNOSIS — J449 Chronic obstructive pulmonary disease, unspecified: Secondary | ICD-10-CM | POA: Insufficient documentation

## 2011-03-19 DIAGNOSIS — I10 Essential (primary) hypertension: Secondary | ICD-10-CM | POA: Insufficient documentation

## 2011-03-19 DIAGNOSIS — H2510 Age-related nuclear cataract, unspecified eye: Secondary | ICD-10-CM | POA: Insufficient documentation

## 2011-03-19 DIAGNOSIS — Z794 Long term (current) use of insulin: Secondary | ICD-10-CM | POA: Insufficient documentation

## 2011-03-19 DIAGNOSIS — Z79899 Other long term (current) drug therapy: Secondary | ICD-10-CM | POA: Insufficient documentation

## 2011-03-19 DIAGNOSIS — J4489 Other specified chronic obstructive pulmonary disease: Secondary | ICD-10-CM | POA: Insufficient documentation

## 2011-03-19 HISTORY — PX: CATARACT EXTRACTION W/PHACO: SHX586

## 2011-03-19 LAB — GLUCOSE, CAPILLARY: Glucose-Capillary: 98 mg/dL (ref 70–99)

## 2011-03-19 SURGERY — PHACOEMULSIFICATION, CATARACT, WITH IOL INSERTION
Anesthesia: Monitor Anesthesia Care | Site: Eye | Laterality: Left | Wound class: Clean

## 2011-03-19 MED ORDER — EPINEPHRINE HCL 1 MG/ML IJ SOLN
INTRAOCULAR | Status: DC | PRN
  Administered 2011-03-19: 10:00:00

## 2011-03-19 MED ORDER — CYCLOPENTOLATE-PHENYLEPHRINE 0.2-1 % OP SOLN
1.0000 [drp] | OPHTHALMIC | Status: AC
  Administered 2011-03-19 (×3): 1 [drp] via OPHTHALMIC

## 2011-03-19 MED ORDER — BSS IO SOLN
INTRAOCULAR | Status: DC | PRN
  Administered 2011-03-19: 15 mL via INTRAOCULAR

## 2011-03-19 MED ORDER — PHENYLEPHRINE HCL 2.5 % OP SOLN
1.0000 [drp] | OPHTHALMIC | Status: AC
  Administered 2011-03-19 (×3): 1 [drp] via OPHTHALMIC

## 2011-03-19 MED ORDER — LACTATED RINGERS IV SOLN: INTRAVENOUS | Status: DC

## 2011-03-19 MED ORDER — TETRACAINE HCL 0.5 % OP SOLN
OPHTHALMIC | Status: AC
  Administered 2011-03-19: 1 [drp] via OPHTHALMIC
  Filled 2011-03-19: qty 2

## 2011-03-19 MED ORDER — PROVISC 10 MG/ML IO SOLN
INTRAOCULAR | Status: DC | PRN
  Administered 2011-03-19: 8.5 mg via INTRAOCULAR

## 2011-03-19 MED ORDER — FENTANYL CITRATE 0.05 MG/ML IJ SOLN: 25.0000 ug | INTRAMUSCULAR | Status: DC | PRN

## 2011-03-19 MED ORDER — FLURBIPROFEN SODIUM 0.03 % OP SOLN: 1.0000 [drp] | OPHTHALMIC | Status: DC

## 2011-03-19 MED ORDER — LIDOCAINE HCL 3.5 % OP GEL: 1.0000 "application " | Freq: Once | OPHTHALMIC | Status: DC

## 2011-03-19 MED ORDER — ONDANSETRON HCL 4 MG/2ML IJ SOLN: 4.0000 mg | Freq: Once | INTRAMUSCULAR | Status: DC | PRN

## 2011-03-19 MED ORDER — MIDAZOLAM HCL 2 MG/2ML IJ SOLN
INTRAMUSCULAR | Status: AC
  Administered 2011-03-19: 2 mg via INTRAVENOUS
  Filled 2011-03-19: qty 2

## 2011-03-19 MED ORDER — PHENYLEPHRINE HCL 2.5 % OP SOLN
OPHTHALMIC | Status: AC
  Administered 2011-03-19: 1 [drp] via OPHTHALMIC
  Filled 2011-03-19: qty 2

## 2011-03-19 MED ORDER — LACTATED RINGERS IV SOLN
INTRAVENOUS | Status: DC
  Administered 2011-03-19: 10:00:00 via INTRAVENOUS

## 2011-03-19 MED ORDER — FLURBIPROFEN SODIUM 0.03 % OP SOLN
OPHTHALMIC | Status: AC
  Administered 2011-03-19: 1 [drp] via OPHTHALMIC
  Filled 2011-03-19: qty 2.5

## 2011-03-19 MED ORDER — FLURBIPROFEN SODIUM 0.03 % OP SOLN
1.0000 [drp] | OPHTHALMIC | Status: AC
  Administered 2011-03-19 (×3): 1 [drp] via OPHTHALMIC

## 2011-03-19 MED ORDER — CYCLOPENTOLATE-PHENYLEPHRINE 0.2-1 % OP SOLN
OPHTHALMIC | Status: AC
  Administered 2011-03-19: 1 [drp] via OPHTHALMIC
  Filled 2011-03-19: qty 2

## 2011-03-19 MED ORDER — EPINEPHRINE HCL 1 MG/ML IJ SOLN
INTRAMUSCULAR | Status: AC
  Filled 2011-03-19: qty 1

## 2011-03-19 MED ORDER — MIDAZOLAM HCL 2 MG/2ML IJ SOLN
1.0000 mg | INTRAMUSCULAR | Status: DC | PRN
  Administered 2011-03-19: 2 mg via INTRAVENOUS

## 2011-03-19 MED ORDER — TETRACAINE HCL 0.5 % OP SOLN
1.0000 [drp] | OPHTHALMIC | Status: AC
  Administered 2011-03-19 (×3): 1 [drp] via OPHTHALMIC

## 2011-03-19 SURGICAL SUPPLY — 25 items
CAPSULAR TENSION RING-AMO (OPHTHALMIC RELATED) IMPLANT
CLOTH BEACON ORANGE TIMEOUT ST (SAFETY) ×1 IMPLANT
DUOVISC SYSTEM (INTRAOCULAR LENS)
EYE SHIELD UNIVERSAL CLEAR (GAUZE/BANDAGES/DRESSINGS) ×1 IMPLANT
GLOVE BIOGEL M 6.5 STRL (GLOVE) ×1 IMPLANT
GLOVE ECLIPSE 6.5 STRL STRAW (GLOVE) IMPLANT
GLOVE ECLIPSE 7.0 STRL STRAW (GLOVE) IMPLANT
GLOVE EXAM NITRILE LRG STRL (GLOVE) IMPLANT
GLOVE EXAM NITRILE MD LF STRL (GLOVE) ×1 IMPLANT
GLOVE SKINSENSE NS SZ6.5 (GLOVE)
GLOVE SKINSENSE STRL SZ6.5 (GLOVE) IMPLANT
HEALON 5 0.6 ML (INTRAOCULAR LENS) IMPLANT
KIT VITRECTOMY (OPHTHALMIC RELATED) IMPLANT
PAD ARMBOARD 7.5X6 YLW CONV (MISCELLANEOUS) ×1 IMPLANT
PROC W NO LENS (INTRAOCULAR LENS)
PROC W SPEC LENS (INTRAOCULAR LENS)
PROCESS W NO LENS (INTRAOCULAR LENS) IMPLANT
PROCESS W SPEC LENS (INTRAOCULAR LENS) IMPLANT
RING MALYGIN (MISCELLANEOUS) IMPLANT
SIGHTPATH CAT PROC W REG LENS (Ophthalmic Related) ×2 IMPLANT
SYSTEM DUOVISC (INTRAOCULAR LENS) IMPLANT
TAPE SURG TRANSPARENT 2IN (GAUZE/BANDAGES/DRESSINGS) IMPLANT
TAPE TRANSPARENT 2IN (GAUZE/BANDAGES/DRESSINGS) ×1
VISCOELASTIC ADDITIONAL (OPHTHALMIC RELATED) IMPLANT
WATER STERILE IRR 250ML POUR (IV SOLUTION) ×1 IMPLANT

## 2011-03-19 NOTE — Anesthesia Preprocedure Evaluation (Signed)
Anesthesia Evaluation  Patient identified by MRN, date of birth, ID band Patient awake  General Assessment Comment  Reviewed: Allergy & Precautions, H&P , NPO status , Patient's Chart, lab work & pertinent test results  History of Anesthesia Complications Negative for: history of anesthetic complications  Airway Mallampati: II      Dental  (+) Edentulous Upper   Pulmonary COPD COPD inhaler    + decreased breath sounds      Cardiovascular hypertension, - angina+ CAD and + Cardiac Stents Regular Normal    Neuro/Psych    GI/Hepatic GERD   Endo/Other  Diabetes mellitus-, Well Controlled, Type 2, Insulin DependentHypothyroidism Morbid obesity  Renal/GU      Musculoskeletal   Abdominal (+) obese,   Peds  Hematology   Anesthesia Other Findings   Reproductive/Obstetrics                           Anesthesia Physical Anesthesia Plan  ASA: III  Anesthesia Plan: MAC   Post-op Pain Management:    Induction:   Airway Management Planned: Nasal Cannula  Additional Equipment:   Intra-op Plan:   Post-operative Plan:   Informed Consent: I have reviewed the patients History and Physical, chart, labs and discussed the procedure including the risks, benefits and alternatives for the proposed anesthesia with the patient or authorized representative who has indicated his/her understanding and acceptance.     Plan Discussed with:   Anesthesia Plan Comments:         Anesthesia Quick Evaluation

## 2011-03-19 NOTE — Transfer of Care (Signed)
Immediate Anesthesia Transfer of Care Note  Patient: Ann [name]  Procedure(s) Performed:  CATARACT EXTRACTION PHACO AND INTRAOCULAR LENS PLACEMENT (IOC) - CDE: 10.31  Patient Location: PACU  Anesthesia Type: MAC  Level of Consciousness: awake and oriented  Airway & Oxygen Therapy: Patient Spontanous Breathing  Post-op Assessment: Report given to PACU RN  Post vital signs: stable  Complications: No apparent anesthesia complications

## 2011-03-19 NOTE — Progress Notes (Signed)
No change in H and P 

## 2011-03-19 NOTE — Op Note (Signed)
Patient brought to the operating room and prepped and draped in the usual manner.  Lid speculum inserted in right eye.  Stab incision made at the twelve o'clock position.  Provisc instilled in the anterior chamber.   A 2.4 mm. Stab incision was made temporally.  An anterior capsulotomy was done with a bent 25 gauge needle.  The nucleus was hydrodissected.  The Phaco tip was inserted in the anterior chamber and the nucleus was emulsified.  CDE was 10.31.  The cortical material was then removed with the I and A tip.  Posterior capsule was the polished.  The anterior chamber was deepened with Provisc.  A 23.0 Diopter Rayner 570C IOL was then inserted in the capsular bag.  Provisc was then removed with the I and A tip.  The wound was then hydrated.  Patient sent to the Recovery Room in good condition with follow up in my office.

## 2011-03-19 NOTE — Anesthesia Postprocedure Evaluation (Signed)
  Anesthesia Post-op Note  Patient: Ann [name]  Procedure(s) Performed:  CATARACT EXTRACTION PHACO AND INTRAOCULAR LENS PLACEMENT (IOC) - CDE: 10.31  Patient Location: PACU  Anesthesia Type: MAC  Level of Consciousness: awake and oriented  Airway and Oxygen Therapy: Patient Spontanous Breathing  Post-op Pain: none  Post-op Assessment: Post-op Vital signs reviewed  Post-op Vital Signs: stable  Complications: No apparent anesthesia complications

## 2011-03-25 ENCOUNTER — Encounter (HOSPITAL_COMMUNITY): Payer: Self-pay | Admitting: Ophthalmology

## 2011-04-16 ENCOUNTER — Encounter: Payer: Self-pay | Admitting: Cardiology

## 2011-04-19 ENCOUNTER — Other Ambulatory Visit: Payer: Self-pay | Admitting: *Deleted

## 2011-04-19 ENCOUNTER — Encounter: Payer: Self-pay | Admitting: Cardiology

## 2011-04-19 ENCOUNTER — Ambulatory Visit: Payer: Medicare Other | Admitting: Cardiology

## 2011-04-19 ENCOUNTER — Ambulatory Visit (INDEPENDENT_AMBULATORY_CARE_PROVIDER_SITE_OTHER): Payer: Medicare Other | Admitting: Cardiology

## 2011-04-19 VITALS — BP 137/91 | HR 60 | Ht 64.0 in | Wt 278.1 lb

## 2011-04-19 DIAGNOSIS — E7800 Pure hypercholesterolemia, unspecified: Secondary | ICD-10-CM

## 2011-04-19 DIAGNOSIS — I5032 Chronic diastolic (congestive) heart failure: Secondary | ICD-10-CM

## 2011-04-19 DIAGNOSIS — I2510 Atherosclerotic heart disease of native coronary artery without angina pectoris: Secondary | ICD-10-CM

## 2011-04-19 DIAGNOSIS — R609 Edema, unspecified: Secondary | ICD-10-CM

## 2011-04-19 DIAGNOSIS — K219 Gastro-esophageal reflux disease without esophagitis: Secondary | ICD-10-CM

## 2011-04-19 DIAGNOSIS — N189 Chronic kidney disease, unspecified: Secondary | ICD-10-CM

## 2011-04-19 DIAGNOSIS — E785 Hyperlipidemia, unspecified: Secondary | ICD-10-CM

## 2011-04-19 DIAGNOSIS — I10 Essential (primary) hypertension: Secondary | ICD-10-CM

## 2011-04-19 DIAGNOSIS — E1165 Type 2 diabetes mellitus with hyperglycemia: Secondary | ICD-10-CM | POA: Insufficient documentation

## 2011-04-19 DIAGNOSIS — E663 Overweight: Secondary | ICD-10-CM

## 2011-04-19 DIAGNOSIS — E118 Type 2 diabetes mellitus with unspecified complications: Secondary | ICD-10-CM

## 2011-04-19 HISTORY — DX: Chronic diastolic (congestive) heart failure: I50.32

## 2011-04-19 MED ORDER — ROSUVASTATIN CALCIUM 40 MG PO TABS: 40.0000 mg | ORAL_TABLET | Freq: Every day | ORAL | Status: DC

## 2011-04-19 MED ORDER — METOPROLOL SUCCINATE ER 50 MG PO TB24: 50.0000 mg | ORAL_TABLET | Freq: Every day | ORAL | Status: DC

## 2011-04-19 MED ORDER — FUROSEMIDE 40 MG PO TABS: 40.0000 mg | ORAL_TABLET | Freq: Every day | ORAL | Status: DC

## 2011-04-19 MED ORDER — QUINAPRIL HCL 40 MG PO TABS: 20.0000 mg | ORAL_TABLET | ORAL | Status: DC

## 2011-04-19 MED ORDER — NITROGLYCERIN 0.4 MG SL SUBL: 0.4000 mg | SUBLINGUAL_TABLET | SUBLINGUAL | Status: DC | PRN

## 2011-04-19 MED ORDER — NITROGLYCERIN 0.4 MG SL SUBL
0.4000 mg | SUBLINGUAL_TABLET | SUBLINGUAL | Status: DC | PRN
Start: 2011-04-19 — End: 2012-01-07

## 2011-04-19 NOTE — Assessment & Plan Note (Signed)
Continue Nexium. Nitroglycerin for backup

## 2011-04-19 NOTE — Patient Instructions (Signed)
Your physician recommends that you schedule a follow-up appointment in: 6 months  
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
## 2011-04-19 NOTE — Assessment & Plan Note (Signed)
Stable. No change in treatment. Continue to carry nitroglycerin.

## 2011-04-19 NOTE — Assessment & Plan Note (Signed)
Improved.  See above. 

## 2011-04-19 NOTE — Assessment & Plan Note (Signed)
Stable. Continue to avoid salt, try to lose some weight, elevate legs, continue diuretic and other meds

## 2011-04-25 ENCOUNTER — Telehealth: Payer: Self-pay | Admitting: Cardiology

## 2011-04-25 NOTE — Telephone Encounter (Signed)
Pt. wants written RX's to send to a mail in pharmacy in January, she is changing pharmacies. Pt. Is advised to call us in late December or early January and we will provide her with written RX's to mail in. Pt. States she understands instructions given./LV

## 2011-04-25 NOTE — Telephone Encounter (Signed)
Patient states that she needs to speak with Dr.Wall's nurse regarding her prescriptions. Says to call her back tomorrow because she will not be at home this evening. / tg

## 2011-05-28 DIAGNOSIS — I82409 Acute embolism and thrombosis of unspecified deep veins of unspecified lower extremity: Secondary | ICD-10-CM

## 2011-05-28 HISTORY — DX: Acute embolism and thrombosis of unspecified deep veins of unspecified lower extremity: I82.409

## 2011-06-18 DIAGNOSIS — I2510 Atherosclerotic heart disease of native coronary artery without angina pectoris: Secondary | ICD-10-CM | POA: Diagnosis not present

## 2011-06-18 DIAGNOSIS — E785 Hyperlipidemia, unspecified: Secondary | ICD-10-CM | POA: Diagnosis not present

## 2011-06-18 DIAGNOSIS — I10 Essential (primary) hypertension: Secondary | ICD-10-CM | POA: Diagnosis not present

## 2011-06-18 DIAGNOSIS — J449 Chronic obstructive pulmonary disease, unspecified: Secondary | ICD-10-CM | POA: Diagnosis not present

## 2011-06-19 DIAGNOSIS — E039 Hypothyroidism, unspecified: Secondary | ICD-10-CM | POA: Diagnosis not present

## 2011-06-19 DIAGNOSIS — E756 Lipid storage disorder, unspecified: Secondary | ICD-10-CM | POA: Diagnosis not present

## 2011-06-19 DIAGNOSIS — E2749 Other adrenocortical insufficiency: Secondary | ICD-10-CM | POA: Diagnosis not present

## 2011-07-09 DIAGNOSIS — R079 Chest pain, unspecified: Secondary | ICD-10-CM | POA: Diagnosis not present

## 2011-07-09 DIAGNOSIS — J449 Chronic obstructive pulmonary disease, unspecified: Secondary | ICD-10-CM | POA: Diagnosis not present

## 2011-07-09 DIAGNOSIS — E039 Hypothyroidism, unspecified: Secondary | ICD-10-CM | POA: Diagnosis not present

## 2011-07-16 ENCOUNTER — Ambulatory Visit (INDEPENDENT_AMBULATORY_CARE_PROVIDER_SITE_OTHER): Payer: Medicare Other | Admitting: Adult Health

## 2011-07-16 ENCOUNTER — Encounter: Payer: Self-pay | Admitting: Adult Health

## 2011-07-16 VITALS — BP 148/100 | HR 72 | Resp 18 | Ht 64.0 in | Wt 281.0 lb

## 2011-07-16 DIAGNOSIS — I5032 Chronic diastolic (congestive) heart failure: Secondary | ICD-10-CM

## 2011-07-16 DIAGNOSIS — B372 Candidiasis of skin and nail: Secondary | ICD-10-CM | POA: Diagnosis not present

## 2011-07-16 DIAGNOSIS — I2510 Atherosclerotic heart disease of native coronary artery without angina pectoris: Secondary | ICD-10-CM

## 2011-07-16 DIAGNOSIS — I10 Essential (primary) hypertension: Secondary | ICD-10-CM

## 2011-07-16 DIAGNOSIS — I509 Heart failure, unspecified: Secondary | ICD-10-CM | POA: Diagnosis not present

## 2011-07-16 DIAGNOSIS — L538 Other specified erythematous conditions: Secondary | ICD-10-CM | POA: Diagnosis not present

## 2011-07-16 DIAGNOSIS — K130 Diseases of lips: Secondary | ICD-10-CM | POA: Diagnosis not present

## 2011-07-16 NOTE — Assessment & Plan Note (Signed)
Cardiac cath in May of 2012 was reassuring. Doubt this chest discomfort is cardiac in etiology. It appears to be more GI with either severe reflux or esophageal stricture or spasm which responds to NTG. I have spoken with Dr. Juanetta Gosling by phone and told him I would recommend referral to GI for possible EGD. She has seen Dr.Rehman in the past. Dr. Juanetta Gosling is in agreement with this. She will be referred for further work-up.

## 2011-07-16 NOTE — Patient Instructions (Signed)
Your physician wants you to follow-up in: 6 months. You will receive a reminder letter in the mail one-two months in advance. If you don't receive a letter, please call our office to schedule the follow-up appointment. Your physician recommends that you continue on your current medications as directed. Please refer to the Current Medication list given to you today. Referral to Dr. Karilyn Cota.

## 2011-07-16 NOTE — Assessment & Plan Note (Signed)
NO evidence of fluid overload at this time. She does have some dependent edema noted but not severe. Continue current medications.
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
## 2011-07-16 NOTE — Assessment & Plan Note (Signed)
Diastolic BP was elevated initially. Recheck has it at 88, still mildly elevated. Will continue to monitor. She is on lasix 40 mg daily along with quinapril. She is also on naprosyn and prednisone which can contribute to BP elevation.

## 2011-07-17 DIAGNOSIS — L0291 Cutaneous abscess, unspecified: Secondary | ICD-10-CM | POA: Diagnosis not present

## 2011-07-17 DIAGNOSIS — IMO0001 Reserved for inherently not codable concepts without codable children: Secondary | ICD-10-CM | POA: Diagnosis not present

## 2011-07-17 DIAGNOSIS — E2749 Other adrenocortical insufficiency: Secondary | ICD-10-CM | POA: Diagnosis not present

## 2011-07-17 DIAGNOSIS — L0390 Cellulitis, unspecified: Secondary | ICD-10-CM | POA: Diagnosis not present

## 2011-07-23 DIAGNOSIS — Z961 Presence of intraocular lens: Secondary | ICD-10-CM | POA: Diagnosis not present

## 2011-07-25 ENCOUNTER — Encounter: Payer: Self-pay | Admitting: Gastroenterology

## 2011-07-26 HISTORY — PX: ESOPHAGOGASTRODUODENOSCOPY: SHX1529

## 2011-08-07 DIAGNOSIS — G5790 Unspecified mononeuropathy of unspecified lower limb: Secondary | ICD-10-CM | POA: Diagnosis not present

## 2011-08-07 DIAGNOSIS — E1149 Type 2 diabetes mellitus with other diabetic neurological complication: Secondary | ICD-10-CM | POA: Diagnosis not present

## 2011-08-07 DIAGNOSIS — B351 Tinea unguium: Secondary | ICD-10-CM | POA: Diagnosis not present

## 2011-08-14 ENCOUNTER — Encounter: Payer: Self-pay | Admitting: Gastroenterology

## 2011-08-14 ENCOUNTER — Ambulatory Visit (INDEPENDENT_AMBULATORY_CARE_PROVIDER_SITE_OTHER): Payer: Medicare Other | Admitting: Gastroenterology

## 2011-08-14 VITALS — BP 136/80 | HR 64 | Ht 64.0 in | Wt 288.0 lb

## 2011-08-14 DIAGNOSIS — R072 Precordial pain: Secondary | ICD-10-CM | POA: Diagnosis not present

## 2011-08-14 DIAGNOSIS — Z86010 Personal history of colonic polyps: Secondary | ICD-10-CM | POA: Diagnosis not present

## 2011-08-14 DIAGNOSIS — E785 Hyperlipidemia, unspecified: Secondary | ICD-10-CM | POA: Diagnosis not present

## 2011-08-14 DIAGNOSIS — I10 Essential (primary) hypertension: Secondary | ICD-10-CM | POA: Diagnosis not present

## 2011-08-14 DIAGNOSIS — K219 Gastro-esophageal reflux disease without esophagitis: Secondary | ICD-10-CM

## 2011-08-14 DIAGNOSIS — R1319 Other dysphagia: Secondary | ICD-10-CM | POA: Diagnosis not present

## 2011-08-14 NOTE — Progress Notes (Signed)
History of Present Illness: This is a 66 year old female here today with her husband. She has a 2 month history of persistent mid, lower, substernal chest pain that she describes as a heaviness and occasionally a burning. She notes her chest pain increases when she is short of breath. She has intermittent problems with choking with occasional coughing when swallowing solids or liquids. She remains on Nexium 40 mg twice daily. She was evaluated by Dr. Valera Castle who felt her chest symptoms were not cardiac related. She underwent upper endoscopy with dilation in 2005 for GERD. No stricture was noted. She was recommended to stay on Nexium 40 mg twice daily. She underwent colonoscopy for polyp surveillance in January 2008 which showed small colon polyps diverticulosis and hemorrhoids. Denies weight loss, abdominal pain, diarrhea, change in stool caliber, melena, hematochezia, nausea, vomiting.  Review of Systems: Pertinent positive and negative review of systems were noted in the above HPI section. All other review of systems were otherwise negative.  Current Medications, Allergies, Past Medical History, Past Surgical History, Family History and Social History were reviewed in [employer] record.  Physical Exam: General: Well developed , well nourished, obese, chronically ill-appearing, dyspneic at rest Head: Normocephalic and atraumatic Eyes:  sclerae anicteric, EOMI Ears: Normal auditory acuity Mouth: No deformity or lesions Neck: Supple, no masses or thyromegaly Lungs: Clear throughout to auscultation Heart: Regular rate and rhythm; no murmurs, rubs or bruits Abdomen: Soft, large and protuberant, non tender and non distended. No masses, hepatosplenomegaly or hernias noted. Normal Bowel sounds Musculoskeletal: Symmetrical with no gross deformities  Skin: No lesions on visible extremities Pulses:  Normal pulses noted Extremities: No clubbing, cyanosis, or deformities noted.  Erythema and tenderness in her left lower extremity consistent with cellulitis Neurological: Alert oriented x 4, grossly nonfocal Cervical Nodes:  No significant cervical adenopathy Inguinal Nodes: No significant inguinal adenopathy Psychological:  Alert and cooperative. Normal mood and affect  Assessment and Recommendations:  1. Chest pain which increases with shortness of breath. She has underlying GERD and notes occasional solid and liquid dysphagia, mainly described as coughing or choking. She may have oropharyngeal dysphagia. One component of her chest pain could be related to GERD however I do not feel that all her chest symptoms are GI related. Continue Nexium 40 mg twice a day along with antireflux measures. Schedule upper endoscopy with possible dilation. She is at higher risk for complications due to her significant comorbidities. The risks, benefits, and alternatives to endoscopy with possible biopsy and possible dilation were discussed with the patient and they consent to proceed.   2. Personal history of adenomatous colon polyps. Given her significant comorbidities and higher risk for sedation we will not plan to proceed with future surveillance colonoscopies.  3. GERD. See problem #1.  4. Obesity.  5. COPD.  6. Coronary artery disease.  7. Diabetes mellitus with complications.

## 2011-08-14 NOTE — Patient Instructions (Addendum)
You have been scheduled for an endoscopy with propofol. Please follow written instructions given to you at your visit today. cc: Kari Baars, MD       Valera Castle, MD

## 2011-08-19 DIAGNOSIS — L0291 Cutaneous abscess, unspecified: Secondary | ICD-10-CM | POA: Diagnosis not present

## 2011-08-19 DIAGNOSIS — I2510 Atherosclerotic heart disease of native coronary artery without angina pectoris: Secondary | ICD-10-CM | POA: Diagnosis not present

## 2011-08-19 DIAGNOSIS — J449 Chronic obstructive pulmonary disease, unspecified: Secondary | ICD-10-CM | POA: Diagnosis not present

## 2011-08-21 ENCOUNTER — Ambulatory Visit (AMBULATORY_SURGERY_CENTER): Payer: Medicare Other | Admitting: Gastroenterology

## 2011-08-21 ENCOUNTER — Encounter: Payer: Self-pay | Admitting: Gastroenterology

## 2011-08-21 VITALS — BP 154/69 | HR 65 | Temp 98.7°F | Resp 20 | Ht 64.0 in | Wt 288.0 lb

## 2011-08-21 DIAGNOSIS — R1319 Other dysphagia: Secondary | ICD-10-CM | POA: Diagnosis not present

## 2011-08-21 DIAGNOSIS — K319 Disease of stomach and duodenum, unspecified: Secondary | ICD-10-CM

## 2011-08-21 DIAGNOSIS — F411 Generalized anxiety disorder: Secondary | ICD-10-CM | POA: Diagnosis not present

## 2011-08-21 DIAGNOSIS — K219 Gastro-esophageal reflux disease without esophagitis: Secondary | ICD-10-CM | POA: Diagnosis not present

## 2011-08-21 DIAGNOSIS — K2970 Gastritis, unspecified, without bleeding: Secondary | ICD-10-CM

## 2011-08-21 DIAGNOSIS — R072 Precordial pain: Secondary | ICD-10-CM

## 2011-08-21 DIAGNOSIS — B3781 Candidal esophagitis: Secondary | ICD-10-CM | POA: Diagnosis not present

## 2011-08-21 DIAGNOSIS — K2990 Gastroduodenitis, unspecified, without bleeding: Secondary | ICD-10-CM | POA: Diagnosis not present

## 2011-08-21 LAB — GLUCOSE, CAPILLARY: Glucose-Capillary: 144 mg/dL — ABNORMAL HIGH (ref 70–99)

## 2011-08-21 MED ORDER — FLUCONAZOLE 100 MG PO TABS: 100.0000 mg | ORAL_TABLET | Freq: Every day | ORAL | Status: AC

## 2011-08-21 MED ORDER — SODIUM CHLORIDE 0.9 % IV SOLN: 500.0000 mL | INTRAVENOUS | Status: DC

## 2011-08-21 NOTE — Op Note (Signed)
[facility] [address] [place], [place]  [address]  ENDOSCOPY PROCEDURE REPORT PATIENT:  Ann [name], Ann [name]  MR#:  [id number] BIRTHDATE:  [birth year], 65 yrs. old  GENDER:  female ENDOSCOPIST:  Judie Petit T. Russella Dar, MD, [facility]  PROCEDURE DATE:  08/21/2011 PROCEDURE:  EGD with biopsy, 213[address] ASA CLASS:  Class III INDICATIONS:  chest pain, dysphagia MEDICATIONS:  MAC sedation, administered by CRNA, propofol (Diprivan) 60 mg IV TOPICAL ANESTHETIC:  Cetacaine Spray DESCRIPTION OF PROCEDURE:   After the risks benefits and alternatives of the procedure were thoroughly explained, informed consent was obtained.  The LB GIF-H180 [id number] endoscope was introduced through the mouth and advanced to the second portion of the duodenum, without limitations.  The instrument was slowly withdrawn as the mucosa was fully examined. <<PROCEDUREIMAGES>> Exudates in the distal esophagus. They were yellow and patchy, c/w candida. Multiple biopsies were obtained and sent to pathology. Otherwise normal esophagus.  Moderate gastritis was found in the antrum. It was erosive. Multiple biopsies were obtained and sent to pathology.  A white, round, plaque was found in the body of the stomach on the lesser curvature. Multiple biopsies were obtained and sent to pathology. Otherwise normal stomach. The duodenal bulb was normal in appearance, as was the postbulbar duodenum. Retroflexed views revealed no abnormalities.  The scope was then withdrawn from the patient and the procedure completed. COMPLICATIONS:  None  ENDOSCOPIC IMPRESSION: 1) Exudates in the esophagus c/w candida 2) Moderate erosive gastritis 3) Plaque in the body of the stomach  RECOMMENDATIONS: 1) Anti-reflux regimen long term 2) Await pathology results 3) PPI daily long term 4) Diflucan 100mg , 2 po on day 1 and then 1 po daily, #10, no refills 5) Avoid or minimize NSAIDs if possible  Arbie Blankley T. Russella Dar, MD, [facility]  CC:  Shaune Pollack,  MD  n. Rosalie DoctorVenita Lick. Reighan Hipolito at 08/21/2011 10:50 AM  Marykay Lex, [phone]

## 2011-08-21 NOTE — Patient Instructions (Signed)
YOU HAD AN ENDOSCOPIC PROCEDURE TODAY AT THE [facility]: Refer to the procedure report that was given to you for any specific questions about what was found during the examination.  If the procedure report does not answer your questions, please call your gastroenterologist to clarify.  If you requested that your care partner not be given the details of your procedure findings, then the procedure report has been included in a sealed envelope for you to review at your convenience later.  YOU SHOULD EXPECT: Some feelings of bloating in the abdomen. Passage of more gas than usual.  Walking can help get rid of the air that was put into your GI tract during the procedure and reduce the bloating. If you had a lower endoscopy (such as a colonoscopy or flexible sigmoidoscopy) you may notice spotting of blood in your stool or on the toilet paper. If you underwent a bowel prep for your procedure, then you may not have a normal bowel movement for a few days.  DIET: Your first meal following the procedure should be a light meal and then it is ok to progress to your normal diet.  A half-sandwich or bowl of soup is an example of a good first meal.  Heavy or fried foods are harder to digest and may make you feel nauseous or bloated.  Likewise meals heavy in dairy and vegetables can cause extra gas to form and this can also increase the bloating.  Drink plenty of fluids but you should avoid alcoholic beverages for 24 hours.  ACTIVITY: Your care partner should take you home directly after the procedure.  You should plan to take it easy, moving slowly for the rest of the day.  You can resume normal activity the day after the procedure however you should NOT DRIVE or use heavy machinery for 24 hours (because of the sedation medicines used during the test).    SYMPTOMS TO REPORT IMMEDIATELY: A gastroenterologist can be reached at any hour.  During normal business hours, 8:30 AM to 5:00 PM Monday through Friday,  call [phone].  After hours and on weekends, please call the GI answering service at [phone] who will take a message and have the physician on call contact you.     Following upper endoscopy (EGD)  Vomiting of blood or coffee ground material  New chest pain or pain under the shoulder blades  Painful or persistently difficult swallowing  New shortness of breath  Fever of 100F or higher  Black, tarry-looking stools  FOLLOW UP: If any biopsies were taken you will be contacted by phone or by letter within the next 1-3 weeks.  Call your gastroenterologist if you have not heard about the biopsies in 3 weeks.  Our staff will call the home number listed on your records the next business day following your procedure to check on you and address any questions or concerns that you may have at that time regarding the information given to you following your procedure. This is a courtesy call and so if there is no answer at the home number and we have not heard from you through the emergency physician on call, we will assume that you have returned to your regular daily activities without incident.    INFORMATION ON GASTRITIS, AND ANTI REFLUX OR GERD INFORMATION GIVEN TO YOU TODAY .   DIFLUCENS MEDICATION SENT TO YOUR PHARMACY [facility].  SIGNATURES/CONFIDENTIALITY: You and/or your care partner have signed paperwork which will be entered into your  electronic medical record.  These signatures attest to the fact that that the information above on your After Visit Summary has been reviewed and is understood.  Full responsibility of the confidentiality of this discharge information lies with you and/or your care-partner.

## 2011-08-21 NOTE — Progress Notes (Signed)
Patient did not experience any of the following events: a burn prior to discharge; a fall within the facility; wrong site/side/patient/procedure/implant event; or a hospital transfer or hospital admission upon discharge from the facility. (G8907) Patient did not have preoperative order for IV antibiotic SSI prophylaxis. (G8918)  

## 2011-08-22 ENCOUNTER — Telehealth: Payer: Self-pay | Admitting: *Deleted

## 2011-08-22 NOTE — Telephone Encounter (Signed)
  Follow up Call-  Call back number 08/21/2011  Post procedure Call Back phone  # [phone]  Permission to leave phone message Yes     Patient questions:  Do you have a fever, pain , or abdominal swelling? no Pain Score  0 *  Have you tolerated food without any problems? yes  Have you been able to return to your normal activities? yes  Do you have any questions about your discharge instructions: Diet   no Medications  no Follow up visit  no  Do you have questions or concerns about your Care? no  Actions: * If pain score is 4 or above: No action needed, pain <4.

## 2011-08-26 DIAGNOSIS — J449 Chronic obstructive pulmonary disease, unspecified: Secondary | ICD-10-CM | POA: Diagnosis not present

## 2011-08-26 DIAGNOSIS — I10 Essential (primary) hypertension: Secondary | ICD-10-CM | POA: Diagnosis not present

## 2011-08-26 DIAGNOSIS — E785 Hyperlipidemia, unspecified: Secondary | ICD-10-CM | POA: Diagnosis not present

## 2011-08-26 DIAGNOSIS — E039 Hypothyroidism, unspecified: Secondary | ICD-10-CM | POA: Diagnosis not present

## 2011-08-27 ENCOUNTER — Encounter: Payer: Self-pay | Admitting: Gastroenterology

## 2011-09-04 DIAGNOSIS — E1149 Type 2 diabetes mellitus with other diabetic neurological complication: Secondary | ICD-10-CM | POA: Diagnosis not present

## 2011-09-04 DIAGNOSIS — E119 Type 2 diabetes mellitus without complications: Secondary | ICD-10-CM | POA: Diagnosis not present

## 2011-09-10 ENCOUNTER — Inpatient Hospital Stay (HOSPITAL_COMMUNITY)
Admission: AD | Admit: 2011-09-10 | Discharge: 2011-09-12 | DRG: 603 | Disposition: A | Discharge status: Home Health | Payer: Medicare Other | Source: Ambulatory Visit | Attending: Pulmonary Disease | Admitting: Pulmonary Disease

## 2011-09-10 ENCOUNTER — Encounter (HOSPITAL_COMMUNITY): Payer: Self-pay | Admitting: *Deleted

## 2011-09-10 DIAGNOSIS — I129 Hypertensive chronic kidney disease with stage 1 through stage 4 chronic kidney disease, or unspecified chronic kidney disease: Secondary | ICD-10-CM | POA: Diagnosis present

## 2011-09-10 DIAGNOSIS — L02619 Cutaneous abscess of unspecified foot: Secondary | ICD-10-CM | POA: Diagnosis present

## 2011-09-10 DIAGNOSIS — E2749 Other adrenocortical insufficiency: Secondary | ICD-10-CM | POA: Diagnosis present

## 2011-09-10 DIAGNOSIS — I509 Heart failure, unspecified: Secondary | ICD-10-CM | POA: Diagnosis present

## 2011-09-10 DIAGNOSIS — I10 Essential (primary) hypertension: Secondary | ICD-10-CM

## 2011-09-10 DIAGNOSIS — I5032 Chronic diastolic (congestive) heart failure: Secondary | ICD-10-CM | POA: Diagnosis present

## 2011-09-10 DIAGNOSIS — I5030 Unspecified diastolic (congestive) heart failure: Secondary | ICD-10-CM | POA: Diagnosis present

## 2011-09-10 DIAGNOSIS — I2510 Atherosclerotic heart disease of native coronary artery without angina pectoris: Secondary | ICD-10-CM | POA: Diagnosis present

## 2011-09-10 DIAGNOSIS — K219 Gastro-esophageal reflux disease without esophagitis: Secondary | ICD-10-CM | POA: Diagnosis present

## 2011-09-10 DIAGNOSIS — E669 Obesity, unspecified: Secondary | ICD-10-CM | POA: Diagnosis present

## 2011-09-10 DIAGNOSIS — K2970 Gastritis, unspecified, without bleeding: Secondary | ICD-10-CM

## 2011-09-10 DIAGNOSIS — IMO0001 Reserved for inherently not codable concepts without codable children: Secondary | ICD-10-CM | POA: Diagnosis not present

## 2011-09-10 DIAGNOSIS — L03116 Cellulitis of left lower limb: Secondary | ICD-10-CM

## 2011-09-10 DIAGNOSIS — R609 Edema, unspecified: Secondary | ICD-10-CM

## 2011-09-10 DIAGNOSIS — N189 Chronic kidney disease, unspecified: Secondary | ICD-10-CM | POA: Diagnosis present

## 2011-09-10 DIAGNOSIS — Z6841 Body Mass Index (BMI) 40.0 and over, adult: Secondary | ICD-10-CM

## 2011-09-10 DIAGNOSIS — E039 Hypothyroidism, unspecified: Secondary | ICD-10-CM | POA: Diagnosis present

## 2011-09-10 DIAGNOSIS — E7800 Pure hypercholesterolemia, unspecified: Secondary | ICD-10-CM

## 2011-09-10 DIAGNOSIS — L03119 Cellulitis of unspecified part of limb: Secondary | ICD-10-CM | POA: Diagnosis present

## 2011-09-10 DIAGNOSIS — E119 Type 2 diabetes mellitus without complications: Secondary | ICD-10-CM | POA: Diagnosis present

## 2011-09-10 DIAGNOSIS — E785 Hyperlipidemia, unspecified: Secondary | ICD-10-CM | POA: Diagnosis present

## 2011-09-10 DIAGNOSIS — L02419 Cutaneous abscess of limb, unspecified: Principal | ICD-10-CM | POA: Diagnosis present

## 2011-09-10 DIAGNOSIS — Z09 Encounter for follow-up examination after completed treatment for conditions other than malignant neoplasm: Secondary | ICD-10-CM | POA: Diagnosis not present

## 2011-09-10 DIAGNOSIS — R1319 Other dysphagia: Secondary | ICD-10-CM

## 2011-09-10 DIAGNOSIS — J449 Chronic obstructive pulmonary disease, unspecified: Secondary | ICD-10-CM | POA: Diagnosis not present

## 2011-09-10 DIAGNOSIS — J9819 Other pulmonary collapse: Secondary | ICD-10-CM | POA: Diagnosis not present

## 2011-09-10 DIAGNOSIS — R072 Precordial pain: Secondary | ICD-10-CM

## 2011-09-10 LAB — CBC
HCT: 38.5 % (ref 36.0–46.0)
MCH: 29.4 pg (ref 26.0–34.0)
MCHC: 32.5 g/dL (ref 30.0–36.0)
MCV: 90.6 fL (ref 78.0–100.0)
Platelets: 143 10*3/uL — ABNORMAL LOW (ref 150–400)
RDW: 15.7 % — ABNORMAL HIGH (ref 11.5–15.5)
WBC: 11.4 10*3/uL — ABNORMAL HIGH (ref 4.0–10.5)

## 2011-09-10 LAB — GLUCOSE, CAPILLARY: Glucose-Capillary: 124 mg/dL — ABNORMAL HIGH (ref 70–99)

## 2011-09-10 LAB — CREATININE, SERUM: GFR calc Af Amer: 69 mL/min — ABNORMAL LOW (ref >90)

## 2011-09-10 MED ORDER — FLUDROCORTISONE ACETATE 0.1 MG PO TABS
0.1000 mg | ORAL_TABLET | Freq: Every day | ORAL | Status: DC
  Administered 2011-09-10 – 2011-09-12 (×3): 0.1 mg via ORAL
  Filled 2011-09-10 (×4): qty 1

## 2011-09-10 MED ORDER — FLUCONAZOLE 100 MG PO TABS
100.0000 mg | ORAL_TABLET | Freq: Every day | ORAL | Status: DC
  Administered 2011-09-10 – 2011-09-12 (×3): 100 mg via ORAL
  Filled 2011-09-10 (×3): qty 1

## 2011-09-10 MED ORDER — ALUM & MAG HYDROXIDE-SIMETH 200-200-20 MG/5ML PO SUSP: 30.0000 mL | Freq: Four times a day (QID) | ORAL | Status: DC | PRN

## 2011-09-10 MED ORDER — ONDANSETRON HCL 4 MG PO TABS: 4.0000 mg | ORAL_TABLET | Freq: Four times a day (QID) | ORAL | Status: DC | PRN

## 2011-09-10 MED ORDER — ASPIRIN EC 81 MG PO TBEC
81.0000 mg | DELAYED_RELEASE_TABLET | Freq: Every day | ORAL | Status: DC
  Administered 2011-09-10 – 2011-09-12 (×3): 81 mg via ORAL
  Filled 2011-09-10 (×3): qty 1

## 2011-09-10 MED ORDER — FLUTICASONE-SALMETEROL 250-50 MCG/DOSE IN AEPB
1.0000 | INHALATION_SPRAY | Freq: Two times a day (BID) | RESPIRATORY_TRACT | Status: DC
  Administered 2011-09-11 – 2011-09-12 (×3): 1 via RESPIRATORY_TRACT
  Filled 2011-09-10: qty 14

## 2011-09-10 MED ORDER — HYDROCODONE-ACETAMINOPHEN 5-325 MG PO TABS
1.0000 | ORAL_TABLET | ORAL | Status: DC | PRN
  Administered 2011-09-10 – 2011-09-11 (×2): 2 via ORAL
  Filled 2011-09-10 (×2): qty 2

## 2011-09-10 MED ORDER — SODIUM CHLORIDE 0.9 % IJ SOLN
3.0000 mL | INTRAMUSCULAR | Status: DC | PRN
  Filled 2011-09-10: qty 3

## 2011-09-10 MED ORDER — TOLTERODINE TARTRATE ER 2 MG PO CP24
4.0000 mg | ORAL_CAPSULE | Freq: Every day | ORAL | Status: DC
  Administered 2011-09-10 – 2011-09-12 (×3): 4 mg via ORAL
  Filled 2011-09-10 (×3): qty 2

## 2011-09-10 MED ORDER — PANTOPRAZOLE SODIUM 40 MG PO TBEC
40.0000 mg | DELAYED_RELEASE_TABLET | Freq: Every day | ORAL | Status: DC
  Administered 2011-09-11 – 2011-09-12 (×2): 40 mg via ORAL
  Filled 2011-09-10 (×3): qty 1

## 2011-09-10 MED ORDER — METOPROLOL SUCCINATE ER 50 MG PO TB24
50.0000 mg | ORAL_TABLET | Freq: Every day | ORAL | Status: DC
  Administered 2011-09-11 – 2011-09-12 (×2): 50 mg via ORAL
  Filled 2011-09-10 (×3): qty 1

## 2011-09-10 MED ORDER — VANCOMYCIN HCL 1000 MG IV SOLR
1250.0000 mg | Freq: Once | INTRAVENOUS | Status: AC
  Administered 2011-09-10: 1250 mg via INTRAVENOUS
  Filled 2011-09-10: qty 1250

## 2011-09-10 MED ORDER — DOCUSATE SODIUM 100 MG PO CAPS
100.0000 mg | ORAL_CAPSULE | Freq: Two times a day (BID) | ORAL | Status: DC
  Administered 2011-09-10 – 2011-09-12 (×4): 100 mg via ORAL
  Filled 2011-09-10 (×5): qty 1

## 2011-09-10 MED ORDER — VANCOMYCIN HCL IN DEXTROSE 1-5 GM/200ML-% IV SOLN
1000.0000 mg | Freq: Two times a day (BID) | INTRAVENOUS | Status: DC
  Administered 2011-09-11 – 2011-09-12 (×3): 1000 mg via INTRAVENOUS
  Filled 2011-09-10 (×3): qty 200

## 2011-09-10 MED ORDER — LINAGLIPTIN 5 MG PO TABS
5.0000 mg | ORAL_TABLET | Freq: Every day | ORAL | Status: DC
  Administered 2011-09-11 – 2011-09-12 (×2): 5 mg via ORAL
  Filled 2011-09-10 (×3): qty 1

## 2011-09-10 MED ORDER — GABAPENTIN 100 MG PO CAPS
100.0000 mg | ORAL_CAPSULE | Freq: Every day | ORAL | Status: DC
  Administered 2011-09-10 – 2011-09-12 (×3): 100 mg via ORAL
  Filled 2011-09-10 (×3): qty 1

## 2011-09-10 MED ORDER — LISINOPRIL 10 MG PO TABS
20.0000 mg | ORAL_TABLET | Freq: Every day | ORAL | Status: DC
  Administered 2011-09-11 – 2011-09-12 (×2): 20 mg via ORAL
  Filled 2011-09-10 (×3): qty 2

## 2011-09-10 MED ORDER — NITROGLYCERIN 0.4 MG SL SUBL: 0.4000 mg | SUBLINGUAL_TABLET | SUBLINGUAL | Status: DC | PRN

## 2011-09-10 MED ORDER — FUROSEMIDE 10 MG/ML IJ SOLN
40.0000 mg | Freq: Two times a day (BID) | INTRAMUSCULAR | Status: DC
  Administered 2011-09-10 – 2011-09-12 (×4): 40 mg via INTRAVENOUS
  Filled 2011-09-10 (×4): qty 4

## 2011-09-10 MED ORDER — FLUTICASONE PROPIONATE 50 MCG/ACT NA SUSP
1.0000 | Freq: Every day | NASAL | Status: DC
  Administered 2011-09-10 – 2011-09-12 (×3): 1 via NASAL
  Filled 2011-09-10: qty 16

## 2011-09-10 MED ORDER — ACETAMINOPHEN 650 MG RE SUPP: 650.0000 mg | Freq: Four times a day (QID) | RECTAL | Status: DC | PRN

## 2011-09-10 MED ORDER — NAPROXEN 250 MG PO TABS
500.0000 mg | ORAL_TABLET | Freq: Two times a day (BID) | ORAL | Status: DC
  Administered 2011-09-10 – 2011-09-12 (×4): 500 mg via ORAL
  Filled 2011-09-10 (×4): qty 2

## 2011-09-10 MED ORDER — ONDANSETRON HCL 4 MG/2ML IJ SOLN
4.0000 mg | Freq: Four times a day (QID) | INTRAMUSCULAR | Status: DC | PRN
Start: 2011-09-10 — End: 2011-09-12

## 2011-09-10 MED ORDER — TIOTROPIUM BROMIDE MONOHYDRATE 18 MCG IN CAPS
18.0000 ug | ORAL_CAPSULE | Freq: Every day | RESPIRATORY_TRACT | Status: DC
  Administered 2011-09-11 – 2011-09-12 (×2): 18 ug via RESPIRATORY_TRACT
  Filled 2011-09-10: qty 5

## 2011-09-10 MED ORDER — ALPRAZOLAM 0.5 MG PO TABS: 0.5000 mg | ORAL_TABLET | Freq: Three times a day (TID) | ORAL | Status: DC | PRN

## 2011-09-10 MED ORDER — ALBUTEROL SULFATE HFA 108 (90 BASE) MCG/ACT IN AERS: 2.0000 | INHALATION_SPRAY | RESPIRATORY_TRACT | Status: DC | PRN

## 2011-09-10 MED ORDER — INSULIN DETEMIR 100 UNIT/ML ~~LOC~~ SOLN
40.0000 [IU] | Freq: Every day | SUBCUTANEOUS | Status: DC
  Administered 2011-09-10 – 2011-09-12 (×3): 40 [IU] via SUBCUTANEOUS
  Filled 2011-09-10: qty 10

## 2011-09-10 MED ORDER — TRAZODONE HCL 50 MG PO TABS: 50.0000 mg | ORAL_TABLET | Freq: Every evening | ORAL | Status: DC | PRN

## 2011-09-10 MED ORDER — ACETAMINOPHEN 325 MG PO TABS: 650.0000 mg | ORAL_TABLET | Freq: Four times a day (QID) | ORAL | Status: DC | PRN

## 2011-09-10 MED ORDER — SODIUM CHLORIDE 0.9 % IJ SOLN
3.0000 mL | Freq: Two times a day (BID) | INTRAMUSCULAR | Status: DC
  Administered 2011-09-10 (×2): 3 mL via INTRAVENOUS
  Filled 2011-09-10 (×3): qty 3

## 2011-09-10 MED ORDER — ENOXAPARIN SODIUM 40 MG/0.4ML ~~LOC~~ SOLN
40.0000 mg | Freq: Every day | SUBCUTANEOUS | Status: DC
  Administered 2011-09-10 – 2011-09-12 (×3): 40 mg via SUBCUTANEOUS
  Filled 2011-09-10 (×3): qty 0.4

## 2011-09-10 MED ORDER — DEXAMETHASONE 4 MG PO TABS
4.0000 mg | ORAL_TABLET | Freq: Every day | ORAL | Status: DC
Start: 2011-09-11 — End: 2011-09-12
  Administered 2011-09-11 – 2011-09-12 (×2): 4 mg via ORAL
  Filled 2011-09-10 (×3): qty 1

## 2011-09-10 MED ORDER — CEFAZOLIN SODIUM 1-5 GM-% IV SOLN
1.0000 g | Freq: Three times a day (TID) | INTRAVENOUS | Status: DC
  Administered 2011-09-10 – 2011-09-12 (×6): 1 g via INTRAVENOUS
  Filled 2011-09-10 (×6): qty 50

## 2011-09-10 MED ORDER — ALBUTEROL SULFATE (5 MG/ML) 0.5% IN NEBU
2.5000 mg | INHALATION_SOLUTION | RESPIRATORY_TRACT | Status: DC | PRN
  Administered 2011-09-11: 2.5 mg via RESPIRATORY_TRACT
  Filled 2011-09-10: qty 0.5

## 2011-09-10 MED ORDER — LEVOTHYROXINE SODIUM 100 MCG PO TABS
300.0000 ug | ORAL_TABLET | Freq: Every day | ORAL | Status: DC
  Administered 2011-09-11 – 2011-09-12 (×2): 300 ug via ORAL
  Filled 2011-09-10 (×3): qty 3

## 2011-09-10 MED ORDER — SODIUM CHLORIDE 0.9 % IV SOLN: 250.0000 mL | INTRAVENOUS | Status: DC | PRN

## 2011-09-10 MED ORDER — INSULIN ASPART 100 UNIT/ML ~~LOC~~ SOLN
15.0000 [IU] | Freq: Three times a day (TID) | SUBCUTANEOUS | Status: DC
  Administered 2011-09-10 – 2011-09-12 (×5): 15 [IU] via SUBCUTANEOUS

## 2011-09-10 MED ORDER — ATORVASTATIN CALCIUM 40 MG PO TABS
80.0000 mg | ORAL_TABLET | Freq: Every day | ORAL | Status: DC
  Administered 2011-09-10 – 2011-09-11 (×2): 80 mg via ORAL
  Filled 2011-09-10 (×2): qty 2

## 2011-09-10 NOTE — H&P (Signed)
Ann [name], Ann [name]              ACCOUNT NO.:  [id number]  MEDICAL RECORD NO.:  [id number]  LOCATION:  A318                          FACILITY:  [facility]  PHYSICIAN:  Narayan Scull L. Juanetta Gosling, M.D.DATE OF BIRTH:  23-Oct-1945  DATE OF ADMISSION:  09/10/2011 DATE OF DISCHARGE:  LH                             HISTORY & PHYSICAL   REASON FOR ADMISSION:  Cellulitis.  HISTORY:  Ann [name] is a 66 year old with multiple medical problems, who came to my office yesterday with another episode of acute cellulitis of the leg.  She has had multiple similar episodes.  She has been sick with multiple problems over the last several months.  She had been on outpatient antibiotics and they have not worked.  Her past medical history is positive for hypothyroidism, hyperlipidemia, obesity, hypertension, coronary artery occlusive disease, gastroesophageal reflux disease, mild chronic renal failure, diabetes, and adrenal insufficiency.  Her social history, she does not smoke.  She lives at home with her husband.  She does not use alcohol.  Her review of systems, she says she has not had any fever.  She has not had any diarrhea.  She has had some reflux symptoms.  She has not had any seizures or headaches.  PHYSICAL EXAMINATION:  GENERAL:  Shows a well-developed, obese female who is in no acute distress right now. VITAL SIGNS:  Temperature is 97.5, pulse 69, respirations 21, blood pressure 150/84. HEENT:  Her pupils are reactive.  Her nose and throat are clear. NECK:  Supple without masses, bruits, or JVD.  She is somewhat cushingoid from taking steroids for her adrenal insufficiency. CHEST:  Shows rhonchi bilaterally which are chronic. HEART:  Regular without gallop. ABDOMEN:  Soft, obese without masses. EXTREMITIES:  Both lower extremities show significant edema, chronic venous stasis changes, and the left has more erythema and is warm. CENTRAL NERVOUS SYSTEM:  Grossly intact.  Her white count is  11,400, hemoglobin is 12.5, platelets 143, creatinine is 0.97.  The rest of her chemistry profile is pending.  Assessment then is that she has multiple medical problems including cellulitis of the leg which is her major problem right now.  She is going to be treated with IV antibiotics.  She will continue with her other home medications.  She will probably require a PICC line which I will order in the morning.     Milagro Belmares L. Juanetta Gosling, M.D.     [facility]/MEDQ  D:  09/10/2011  T:  09/10/2011  Job:  [id number]

## 2011-09-10 NOTE — H&P (Signed)
[id number] 

## 2011-09-11 ENCOUNTER — Encounter (HOSPITAL_COMMUNITY)

## 2011-09-11 ENCOUNTER — Inpatient Hospital Stay (HOSPITAL_COMMUNITY): Payer: Medicare Other

## 2011-09-11 DIAGNOSIS — J9819 Other pulmonary collapse: Secondary | ICD-10-CM | POA: Diagnosis not present

## 2011-09-11 DIAGNOSIS — Z09 Encounter for follow-up examination after completed treatment for conditions other than malignant neoplasm: Secondary | ICD-10-CM | POA: Diagnosis not present

## 2011-09-11 DIAGNOSIS — L02419 Cutaneous abscess of limb, unspecified: Secondary | ICD-10-CM | POA: Diagnosis not present

## 2011-09-11 DIAGNOSIS — J449 Chronic obstructive pulmonary disease, unspecified: Secondary | ICD-10-CM | POA: Diagnosis not present

## 2011-09-11 LAB — GLUCOSE, CAPILLARY
Glucose-Capillary: 95 mg/dL (ref 70–99)
Glucose-Capillary: 178 mg/dL — ABNORMAL HIGH (ref 70–99)

## 2011-09-11 MED ORDER — PNEUMOCOCCAL VAC POLYVALENT 25 MCG/0.5ML IJ INJ
0.5000 mL | INJECTION | INTRAMUSCULAR | Status: AC
  Administered 2011-09-12: 0.5 mL via INTRAMUSCULAR
  Filled 2011-09-11: qty 0.5

## 2011-09-11 MED ORDER — MAGNESIUM HYDROXIDE 400 MG/5ML PO SUSP: 30.0000 mL | Freq: Four times a day (QID) | ORAL | Status: DC | PRN

## 2011-09-11 MED ORDER — SODIUM CHLORIDE 0.9 % IJ SOLN
10.0000 mL | Freq: Two times a day (BID) | INTRAMUSCULAR | Status: DC
  Administered 2011-09-11 (×2): 10 mL

## 2011-09-11 MED ORDER — NICOTINE 21 MG/24HR TD PT24: 21.0000 mg | MEDICATED_PATCH | Freq: Every day | TRANSDERMAL | Status: DC

## 2011-09-11 MED ORDER — SODIUM CHLORIDE 0.9 % IJ SOLN
10.0000 mL | INTRAMUSCULAR | Status: DC | PRN
  Administered 2011-09-12: 10 mL
  Filled 2011-09-11 (×2): qty 3

## 2011-09-11 NOTE — Progress Notes (Signed)
[id number] 

## 2011-09-11 NOTE — Progress Notes (Signed)
Brief Note  Pt identified by nutrition screen for swallow difficulty. She is currently receiving a CHO MOD Medium diet (regular consistency). Her po's are 100% meal intake. She reports to be tolerating well and desires to cont same. No additional nutrition interventions recommended at this time.  Dietitian [phone]

## 2011-09-11 NOTE — Progress Notes (Signed)
   CARE MANAGEMENT NOTE 09/11/2011  Patient:  Ann [name], Ann [name]   Account Number:  [id number]  Date Initiated:  09/11/2011  Documentation initiated by:  Sharrie Rothman  Subjective/Objective Assessment:   Pt admitted from home with cellulitis. Pt lives with husband and has strong family support. Pt has used [facility] in the past for IV AB. Pt has cane, walker, wheelchair, and lift chair in the home.     Action/Plan:   Spoke with pt about the possible need for IV AB at discharge. List of [facility] agencies given to pt for pt choice of HH agency.   Anticipated DC Date:  09/17/2011   Anticipated DC Plan:  HOME W HOME HEALTH SERVICES      DC Planning Services  CM consult      [facility] Choice  HOME HEALTH   Choice offered to / List presented to:  C-1 Patient        HH arranged  HH-1 RN  IV Antibiotics      HH agency  [facility]   Status of service:  In process, will continue to follow Medicare Important Message given?   (If response is "NO", the following Medicare IM given date fields will be blank) Date Medicare IM given:   Date Additional Medicare IM given:    Discharge Disposition:  HOME W HOME HEALTH SERVICES  Per UR Regulation:    If discussed at Long Length of Stay Meetings, dates discussed:    Comments:  09/11/11 1319 Arlyss Queen, RN BSN CM
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
## 2011-09-12 DIAGNOSIS — L03119 Cellulitis of unspecified part of limb: Secondary | ICD-10-CM | POA: Diagnosis not present

## 2011-09-12 DIAGNOSIS — J449 Chronic obstructive pulmonary disease, unspecified: Secondary | ICD-10-CM | POA: Diagnosis not present

## 2011-09-12 LAB — GLUCOSE, CAPILLARY: Glucose-Capillary: 91 mg/dL (ref 70–99)

## 2011-09-12 MED ORDER — LEVOFLOXACIN IN D5W 500 MG/100ML IV SOLN: 500.0000 mg | INTRAVENOUS | Status: DC

## 2011-09-12 MED ORDER — VANCOMYCIN HCL 1000 MG IV SOLR
2000.0000 mg | INTRAVENOUS | Status: DC
  Administered 2011-09-12: 2000 mg via INTRAVENOUS
  Filled 2011-09-12 (×2): qty 2000

## 2011-09-12 MED ORDER — DEXTROSE 5 % IV SOLN: 2.0000 g | INTRAVENOUS | Status: DC

## 2011-09-12 MED ORDER — VANCOMYCIN HCL IN DEXTROSE 1-5 GM/200ML-% IV SOLN: 2000.0000 mg | INTRAVENOUS | Status: DC

## 2011-09-12 MED ORDER — LEVOFLOXACIN IN D5W 500 MG/100ML IV SOLN
500.0000 mg | INTRAVENOUS | Status: DC
  Administered 2011-09-12: 500 mg via INTRAVENOUS
  Filled 2011-09-12 (×2): qty 100

## 2011-09-12 MED ORDER — VANCOMYCIN HCL 1000 MG IV SOLR: 2000.0000 mg | INTRAVENOUS | Status: DC

## 2011-09-12 NOTE — Plan of Care (Signed)
Problem: Phase III Progression Outcomes Goal: IV Meds changed to PO Outcome: Adequate for Discharge Patient going hom on IV antibiotics
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
## 2011-09-12 NOTE — Discharge Instructions (Signed)
[facility] for registered nursing to administer IV antibiotics to start on 09/13/11. Also bedside commode is being delivered to patients home after 3:00 pm 09/12/11.

## 2011-09-12 NOTE — Progress Notes (Signed)
NAMEKARRINE, KLUTTZ              ACCOUNT NO.:  [id number]  MEDICAL RECORD NO.:  [id number]  LOCATION:  A318                          FACILITY:  [facility]  PHYSICIAN:  Nicholad Kautzman L. Juanetta Gosling, M.D.DATE OF BIRTH:  Nov 21, 1945  DATE OF PROCEDURE: DATE OF DISCHARGE:                                PROGRESS NOTE   Ms. [place] was admitted yesterday with cellulitis, unresponsive to outpatient treatment.  This is a recurrent problem.  She said she feels a little bit better.  She still has pain in her foot.  PHYSICAL EXAMINATION:  VITAL SIGNS:  Her temperature is 97.9, pulse 64, respirations 20, blood pressure 166/97, O2 sats 95%. CHEST:  Clear. HEART:  Regular. EXTREMITIES:  Her leg is a little bit less erythematous. GENERAL:  She is morbidly obese and cushingoid.  ASSESSMENT:  She has cellulitis which I think is improving slowly.  She has diabetes.  She has chronic obstructive pulmonary disease.  She has coronary artery occlusive disease.  She has adrenal insufficiency, on chronic steroids.  PLAN:  My plan is for her to get a PICC line.  Continue all of her other treatments.  No other new treatments other than that.  She will remain on her IV antibiotics.     Strider Vallance L. Juanetta Gosling, M.D.     ELH/MEDQ  D:  09/11/2011  T:  09/12/2011  Job:  [id number]

## 2011-09-12 NOTE — Progress Notes (Signed)
Patient discharged home with husband.  PICC left in place for use at home for IV antibiotics.  Home Health in place to follow.  PAtient instructed on new medications and discharge instructions.  Verbalizes understanding.  No questions at this time

## 2011-09-12 NOTE — Progress Notes (Signed)
   CARE MANAGEMENT NOTE 09/12/2011  Patient:  Ann [name], Ann [name]   Account Number:  [id number]  Date Initiated:  09/11/2011  Documentation initiated by:  Sharrie Rothman  Subjective/Objective Assessment:   Pt admitted from home with cellulitis. Pt lives with husband and has strong family support. Pt has used [facility] in the past for IV AB. Pt has cane, walker, wheelchair, and lift chair in the home.     Action/Plan:   Spoke with pt about the possible need for IV AB at discharge. List of [facility] agencies given to pt for pt choice of HH agency.   Anticipated DC Date:  09/17/2011   Anticipated DC Plan:  HOME W HOME HEALTH SERVICES      DC Planning Services  CM consult      [facility] Choice  HOME HEALTH   Choice offered to / List presented to:  C-1 Patient   DME arranged  [facility]      DME agency  [facility]     HH arranged  HH-1 RN  IV Antibiotics      HH agency  [facility]   Status of service:  Completed, signed off Medicare Important Message given?   (If response is "NO", the following Medicare IM given date fields will be blank) Date Medicare IM given:   Date Additional Medicare IM given:    Discharge Disposition:  HOME W HOME HEALTH SERVICES  Per UR Regulation:    If discussed at Long Length of Stay Meetings, dates discussed:    Comments:  09/12/11 Arlyss Queen, RN BSN CM Pt discharged home with [facility] for IV AB Vancomycin 2000 mg daily and Levaquin 500 mg daily. Alroy Bailiff of [facility] is aware and will collect information from the chart. They will start services on 09/13/11. Pt also requested [facility] from [place]. Faxed order to [place] at [phone]. They will deliver [facility] after 3pm 09/12/11. No other CM or DME need noted.   09/11/11 1319 Arlyss Queen, RN BSN CM

## 2011-09-13 DIAGNOSIS — I10 Essential (primary) hypertension: Secondary | ICD-10-CM | POA: Diagnosis not present

## 2011-09-13 DIAGNOSIS — J449 Chronic obstructive pulmonary disease, unspecified: Secondary | ICD-10-CM | POA: Diagnosis not present

## 2011-09-13 DIAGNOSIS — L02419 Cutaneous abscess of limb, unspecified: Secondary | ICD-10-CM | POA: Diagnosis not present

## 2011-09-13 DIAGNOSIS — Z452 Encounter for adjustment and management of vascular access device: Secondary | ICD-10-CM | POA: Diagnosis not present

## 2011-09-13 DIAGNOSIS — I2510 Atherosclerotic heart disease of native coronary artery without angina pectoris: Secondary | ICD-10-CM | POA: Diagnosis not present

## 2011-09-13 DIAGNOSIS — E119 Type 2 diabetes mellitus without complications: Secondary | ICD-10-CM | POA: Diagnosis not present

## 2011-09-13 NOTE — Discharge Summary (Signed)
Physician Discharge Summary  Patient ID: Ann [name] MRN: [id number] DOB/AGE: 07/06/1945 66 y.o. Primary Care Physician:Kline Bulthuis L, MD, MD Admit date: 09/10/2011 Discharge date: 09/13/2011    Discharge Diagnoses: cellulitis of the right leg and foot Diabetes Hypertension COPD Coronary artery occlusive disease Diastolic congestive heart failure Adrenal insufficiency Morbid obesity  Active Problems:  * No active hospital problems. *    Medication List  As of 09/13/2011  1:04 PM   STOP taking these medications         doxycycline 100 MG tablet         TAKE these medications         albuterol 108 (90 BASE) MCG/ACT inhaler   Commonly known as: PROVENTIL HFA;VENTOLIN HFA   Inhale 2 puffs into the lungs every 6 (six) hours as needed. For shortness of breath      ALPRAZolam 0.5 MG tablet   Commonly known as: XANAX   Take 0.5 mg by mouth 2 (two) times daily as needed. For anxiety      aspirin EC 81 MG tablet   Take 81 mg by mouth daily.      dexamethasone 4 MG tablet   Commonly known as: DECADRON   Take 4 mg by mouth daily with breakfast.      esomeprazole 40 MG capsule   Commonly known as: NEXIUM   Take 40 mg by mouth 2 (two) times daily.      fluconazole 100 MG tablet   Commonly known as: DIFLUCAN   Take 1 tablet (100 mg total) by mouth daily. TAKE 2 TABLETS ON DAY ONE, THEN 1 PO DAILY      fludrocortisone 0.1 MG tablet   Commonly known as: FLORINEF   Take 0.1 mg by mouth daily.      fluticasone 50 MCG/ACT nasal spray   Commonly known as: FLONASE   Place 1 spray into the nose daily.      Fluticasone-Salmeterol 250-50 MCG/DOSE Aepb   Commonly known as: ADVAIR   Inhale 1 puff into the lungs every 12 (twelve) hours.      furosemide 40 MG tablet   Commonly known as: LASIX   Take 1 tablet (40 mg total) by mouth daily.      gabapentin 100 MG capsule   Commonly known as: NEURONTIN   Take 100 mg by mouth 3 (three) times daily.      insulin aspart 100  UNIT/ML injection   Commonly known as: novoLOG   Inject 15-22 Units into the skin 3 (three) times daily before meals.      LEVEMIR FLEXPEN [place]   Inject 40 Units into the skin daily.      levofloxacin 500 MG/100ML Soln   Commonly known as: LEVAQUIN   Inject 100 mLs (500 mg total) into the vein daily.      LEVOTHROID 300 MCG tablet   Generic drug: levothyroxine   Take 300 mcg by mouth daily.      linagliptin 5 MG Tabs tablet   Commonly known as: TRADJENTA   Take 5 mg by mouth daily.      metoprolol succinate 50 MG 24 hr tablet   Commonly known as: TOPROL-XL   Take 1 tablet (50 mg total) by mouth daily.      naproxen 500 MG tablet   Commonly known as: NAPROSYN   Take 500 mg by mouth 2 (two) times daily with a meal.      nitroGLYCERIN 0.4 MG SL tablet   Commonly known  as: NITROSTAT   Place 1 tablet (0.4 mg total) under the tongue every 5 (five) minutes as needed for chest pain.      predniSONE 10 MG tablet   Commonly known as: DELTASONE   Take 10 mg by mouth daily.      quinapril 40 MG tablet   Commonly known as: ACCUPRIL   Take 20 mg by mouth daily.      rosuvastatin 40 MG tablet   Commonly known as: CRESTOR   Take 1 tablet (40 mg total) by mouth daily.      sodium chloride 0.9 % SOLN 500 mL with vancomycin 1000 MG SOLR 2,000 mg   Inject 2,000 mg into the vein daily.      tolterodine 4 MG 24 hr capsule   Commonly known as: DETROL LA   Take 4 mg by mouth at bedtime.      vancomycin 1 GM/200ML Soln   Commonly known as: VANCOCIN   Inject 400 mLs (2,000 mg total) into the vein daily.            Discharged Condition:Improved    Consults:none  Significant Diagnostic Studies: Dg Chest Port 1 View  09/11/2011  *RADIOLOGY REPORT*  Clinical Data: PICC line placement.  PORTABLE CHEST - 1 VIEW  Comparison: 02/11/2011 and 01/16/2011.  Findings: 1213 hours.  Left arm PICC tip in the lower SVC, similar to prior examination.  Heart size and mediastinal contours are  stable.  There is linear atelectasis in the lingula as well as mild bibasilar atelectasis.  No confluent airspace opacity or significant pleural effusion is seen.  There is no pneumothorax. Patient is status post lower cervical fusion.  IMPRESSION: PICC line placement as described. No acute cardiopulmonary process.  Original Report Authenticated By: Gerrianne Scale, M.D.    Lab Results: Basic Metabolic Panel:  Basename 09/10/11 1323  NA --  K --  CL --  CO2 --  GLUCOSE --  BUN --  CREATININE 0.97  CALCIUM --  MG --  PHOS --   Liver Function Tests: No results found for this basename: AST:2,ALT:2,ALKPHOS:2,BILITOT:2,PROT:2,ALBUMIN:2 in the last 72 hours   CBC:  Basename 09/10/11 1323  WBC 11.4*  NEUTROABS --  HGB 12.5  HCT 38.5  MCV 90.6  PLT 143*    No results found for this or any previous visit (from the past 240 hour(s)).   Hospital Course: she was admitted from my office because she was being treated as an outpatient with oral antibiotics and they were not clearing her cellulitis. She was started on intravenous antibiotics and showed rapid improvement. Her other regular home medications were continued. She had a PICC line placed so that she could finish her antibiotics as an outpatient. Her leg improved rapidly  Discharge Exam: Blood pressure 164/78, pulse 64, temperature 97.6 F (36.4 C), temperature source Oral, resp. rate 24, height 5\' 4"  (1.626 m), weight 128.822 kg (284 lb), SpO2 96.00%. She was obese, her chest clear, her heart was regular and her abdomen was soft. She still had some erythema and chronic venous stasis changes in her left leg but it was much better than on admission  Disposition: home with home health services for IV antibiotics  Discharge Orders    Future Orders Please Complete By Expires   Home Health      Scheduling Instructions:   She needs iv levaquin 500mg  daily for 10 days and iv vancomycin 2000mg  daily for 10 days.follow [facility] protocol     Questions:  Responses:   To provide the following care/treatments RN    PT   Face-to-face encounter      Comments:   I Lindley Stachnik L certify that this patient is under my care and that I, or a nurse practitioner or physician's assistant working with me, had a face-to-face encounter that meets the physician face-to-face encounter requirements with this patient on 09/12/2011.       Questions: Responses:   The encounter with the patient was in whole, or in part, for the following medical condition, which is the primary reason for home health care cellulitis   I certify that, based on my findings, the following services are medically necessary home health services Nursing    Physical therapy   My clinical findings support the need for the above services Infection requiring IV antibiotics   Further, I certify that my clinical findings support that this patient is homebound (i.e. absences from home require considerable and taxing effort and are for medical reasons or religious services or infrequently or of short duration when for other reasons) Ambulates short distances less than 300 feet   To provide the following care/treatments RN    PT   Discharge patient         Follow-up Information    Follow up with [facility] [phone].         SignedFredirick Maudlin Pager [phone]  09/13/2011, 1:04 PM

## 2011-09-13 NOTE — Progress Notes (Signed)
NAME:  Ann [name], HORNSTEIN              ACCOUNT NO.:  [id number]  MEDICAL RECORD NO.:  [id number]  LOCATION:                                 FACILITY:  PHYSICIAN:  Makyi Ledo L. Juanetta Gosling, M.D.DATE OF BIRTH:  1945-08-22  DATE OF PROCEDURE: DATE OF DISCHARGE:                                PROGRESS NOTE   Ms. Gwin says she is feeling well and has no complaints.  Her leg is better.  Her other problems are pretty stable.  Her blood sugar has been okay.  She is no more short of breath than usual.  PHYSICAL EXAMINATION:  GENERAL:  Shows that she is awake and alert. VITAL SIGNS:  As recorded in the electronic chart. CHEST:  Pretty clear. HEART:  Regular. ABDOMEN:  Soft. She looks cushingoid from her chronic steroid use. EXTREMITIES:  Her leg and foot are much better.  Her foot is no longer warm.  ASSESSMENT:  She has cellulitis of the leg, which is much improved.  Plan is for her to have a continuation of her treatment as an outpatient.  She will be discharged home for IV meds at home.     Alecea Trego L. Juanetta Gosling, M.D.     ELH/MEDQ  D:  09/12/2011  T:  09/12/2011  Job:  [id number]

## 2011-09-16 DIAGNOSIS — L03119 Cellulitis of unspecified part of limb: Secondary | ICD-10-CM | POA: Diagnosis not present

## 2011-09-16 DIAGNOSIS — Z452 Encounter for adjustment and management of vascular access device: Secondary | ICD-10-CM | POA: Diagnosis not present

## 2011-09-16 DIAGNOSIS — I10 Essential (primary) hypertension: Secondary | ICD-10-CM | POA: Diagnosis not present

## 2011-09-16 DIAGNOSIS — E0590 Thyrotoxicosis, unspecified without thyrotoxic crisis or storm: Secondary | ICD-10-CM | POA: Diagnosis not present

## 2011-09-16 DIAGNOSIS — J449 Chronic obstructive pulmonary disease, unspecified: Secondary | ICD-10-CM | POA: Diagnosis not present

## 2011-09-16 DIAGNOSIS — I2510 Atherosclerotic heart disease of native coronary artery without angina pectoris: Secondary | ICD-10-CM | POA: Diagnosis not present

## 2011-09-16 DIAGNOSIS — L02419 Cutaneous abscess of limb, unspecified: Secondary | ICD-10-CM | POA: Diagnosis not present

## 2011-09-16 DIAGNOSIS — E039 Hypothyroidism, unspecified: Secondary | ICD-10-CM | POA: Diagnosis not present

## 2011-09-16 DIAGNOSIS — E119 Type 2 diabetes mellitus without complications: Secondary | ICD-10-CM | POA: Diagnosis not present

## 2011-09-17 DIAGNOSIS — L03119 Cellulitis of unspecified part of limb: Secondary | ICD-10-CM | POA: Diagnosis not present

## 2011-09-17 DIAGNOSIS — L02419 Cutaneous abscess of limb, unspecified: Secondary | ICD-10-CM | POA: Diagnosis not present

## 2011-09-17 DIAGNOSIS — Z452 Encounter for adjustment and management of vascular access device: Secondary | ICD-10-CM | POA: Diagnosis not present

## 2011-09-17 DIAGNOSIS — E119 Type 2 diabetes mellitus without complications: Secondary | ICD-10-CM | POA: Diagnosis not present

## 2011-09-17 DIAGNOSIS — I2510 Atherosclerotic heart disease of native coronary artery without angina pectoris: Secondary | ICD-10-CM | POA: Diagnosis not present

## 2011-09-17 DIAGNOSIS — J449 Chronic obstructive pulmonary disease, unspecified: Secondary | ICD-10-CM | POA: Diagnosis not present

## 2011-09-17 DIAGNOSIS — I10 Essential (primary) hypertension: Secondary | ICD-10-CM | POA: Diagnosis not present

## 2011-09-20 DIAGNOSIS — L03119 Cellulitis of unspecified part of limb: Secondary | ICD-10-CM | POA: Diagnosis not present

## 2011-09-20 DIAGNOSIS — E119 Type 2 diabetes mellitus without complications: Secondary | ICD-10-CM | POA: Diagnosis not present

## 2011-09-20 DIAGNOSIS — I2510 Atherosclerotic heart disease of native coronary artery without angina pectoris: Secondary | ICD-10-CM | POA: Diagnosis not present

## 2011-09-20 DIAGNOSIS — L02419 Cutaneous abscess of limb, unspecified: Secondary | ICD-10-CM | POA: Diagnosis not present

## 2011-09-20 DIAGNOSIS — I10 Essential (primary) hypertension: Secondary | ICD-10-CM | POA: Diagnosis not present

## 2011-09-20 DIAGNOSIS — J449 Chronic obstructive pulmonary disease, unspecified: Secondary | ICD-10-CM | POA: Diagnosis not present

## 2011-09-20 DIAGNOSIS — Z452 Encounter for adjustment and management of vascular access device: Secondary | ICD-10-CM | POA: Diagnosis not present

## 2011-09-23 DIAGNOSIS — L03119 Cellulitis of unspecified part of limb: Secondary | ICD-10-CM | POA: Diagnosis not present

## 2011-09-23 DIAGNOSIS — Z452 Encounter for adjustment and management of vascular access device: Secondary | ICD-10-CM | POA: Diagnosis not present

## 2011-09-23 DIAGNOSIS — J449 Chronic obstructive pulmonary disease, unspecified: Secondary | ICD-10-CM | POA: Diagnosis not present

## 2011-09-23 DIAGNOSIS — E119 Type 2 diabetes mellitus without complications: Secondary | ICD-10-CM | POA: Diagnosis not present

## 2011-09-23 DIAGNOSIS — L02419 Cutaneous abscess of limb, unspecified: Secondary | ICD-10-CM | POA: Diagnosis not present

## 2011-09-23 DIAGNOSIS — I2510 Atherosclerotic heart disease of native coronary artery without angina pectoris: Secondary | ICD-10-CM | POA: Diagnosis not present

## 2011-09-23 DIAGNOSIS — I10 Essential (primary) hypertension: Secondary | ICD-10-CM | POA: Diagnosis not present

## 2011-09-26 DIAGNOSIS — E039 Hypothyroidism, unspecified: Secondary | ICD-10-CM | POA: Diagnosis not present

## 2011-09-26 DIAGNOSIS — I10 Essential (primary) hypertension: Secondary | ICD-10-CM | POA: Diagnosis not present

## 2011-09-26 DIAGNOSIS — M1990 Unspecified osteoarthritis, unspecified site: Secondary | ICD-10-CM | POA: Diagnosis not present

## 2011-09-26 DIAGNOSIS — E785 Hyperlipidemia, unspecified: Secondary | ICD-10-CM | POA: Diagnosis not present

## 2011-09-27 DIAGNOSIS — I2510 Atherosclerotic heart disease of native coronary artery without angina pectoris: Secondary | ICD-10-CM | POA: Diagnosis not present

## 2011-09-27 DIAGNOSIS — Z452 Encounter for adjustment and management of vascular access device: Secondary | ICD-10-CM | POA: Diagnosis not present

## 2011-09-27 DIAGNOSIS — L03119 Cellulitis of unspecified part of limb: Secondary | ICD-10-CM | POA: Diagnosis not present

## 2011-09-27 DIAGNOSIS — J449 Chronic obstructive pulmonary disease, unspecified: Secondary | ICD-10-CM | POA: Diagnosis not present

## 2011-09-27 DIAGNOSIS — I10 Essential (primary) hypertension: Secondary | ICD-10-CM | POA: Diagnosis not present

## 2011-09-27 DIAGNOSIS — E119 Type 2 diabetes mellitus without complications: Secondary | ICD-10-CM | POA: Diagnosis not present

## 2011-10-01 DIAGNOSIS — L03119 Cellulitis of unspecified part of limb: Secondary | ICD-10-CM | POA: Diagnosis not present

## 2011-10-01 DIAGNOSIS — Z452 Encounter for adjustment and management of vascular access device: Secondary | ICD-10-CM | POA: Diagnosis not present

## 2011-10-01 DIAGNOSIS — E119 Type 2 diabetes mellitus without complications: Secondary | ICD-10-CM | POA: Diagnosis not present

## 2011-10-01 DIAGNOSIS — J449 Chronic obstructive pulmonary disease, unspecified: Secondary | ICD-10-CM | POA: Diagnosis not present

## 2011-10-01 DIAGNOSIS — I2510 Atherosclerotic heart disease of native coronary artery without angina pectoris: Secondary | ICD-10-CM | POA: Diagnosis not present

## 2011-10-01 DIAGNOSIS — I10 Essential (primary) hypertension: Secondary | ICD-10-CM | POA: Diagnosis not present

## 2011-10-01 DIAGNOSIS — L02419 Cutaneous abscess of limb, unspecified: Secondary | ICD-10-CM | POA: Diagnosis not present

## 2011-10-02 DIAGNOSIS — E119 Type 2 diabetes mellitus without complications: Secondary | ICD-10-CM | POA: Diagnosis not present

## 2011-10-02 DIAGNOSIS — E1149 Type 2 diabetes mellitus with other diabetic neurological complication: Secondary | ICD-10-CM | POA: Diagnosis not present

## 2011-10-07 ENCOUNTER — Ambulatory Visit: Payer: Medicare Other | Admitting: Cardiology

## 2011-10-08 NOTE — Progress Notes (Signed)
UR Chart Review Completed  

## 2011-11-01 ENCOUNTER — Encounter (HOSPITAL_COMMUNITY): Payer: Self-pay | Admitting: *Deleted

## 2011-11-01 ENCOUNTER — Emergency Department (HOSPITAL_COMMUNITY)
Admission: EM | Admit: 2011-11-01 | Discharge: 2011-11-01 | Disposition: A | Discharge status: Home / Self Care | Payer: Medicare Other | Attending: Emergency Medicine | Admitting: Emergency Medicine

## 2011-11-01 DIAGNOSIS — E039 Hypothyroidism, unspecified: Secondary | ICD-10-CM | POA: Insufficient documentation

## 2011-11-01 DIAGNOSIS — K219 Gastro-esophageal reflux disease without esophagitis: Secondary | ICD-10-CM | POA: Insufficient documentation

## 2011-11-01 DIAGNOSIS — E119 Type 2 diabetes mellitus without complications: Secondary | ICD-10-CM | POA: Diagnosis not present

## 2011-11-01 DIAGNOSIS — J4489 Other specified chronic obstructive pulmonary disease: Secondary | ICD-10-CM | POA: Insufficient documentation

## 2011-11-01 DIAGNOSIS — Z87891 Personal history of nicotine dependence: Secondary | ICD-10-CM | POA: Diagnosis not present

## 2011-11-01 DIAGNOSIS — Z794 Long term (current) use of insulin: Secondary | ICD-10-CM | POA: Diagnosis not present

## 2011-11-01 DIAGNOSIS — E7800 Pure hypercholesterolemia, unspecified: Secondary | ICD-10-CM | POA: Diagnosis not present

## 2011-11-01 DIAGNOSIS — Z79899 Other long term (current) drug therapy: Secondary | ICD-10-CM | POA: Diagnosis not present

## 2011-11-01 DIAGNOSIS — L304 Erythema intertrigo: Secondary | ICD-10-CM

## 2011-11-01 DIAGNOSIS — J449 Chronic obstructive pulmonary disease, unspecified: Secondary | ICD-10-CM | POA: Diagnosis not present

## 2011-11-01 DIAGNOSIS — X58XXXA Exposure to other specified factors, initial encounter: Secondary | ICD-10-CM | POA: Insufficient documentation

## 2011-11-01 DIAGNOSIS — I10 Essential (primary) hypertension: Secondary | ICD-10-CM | POA: Diagnosis not present

## 2011-11-01 DIAGNOSIS — L538 Other specified erythematous conditions: Secondary | ICD-10-CM | POA: Diagnosis not present

## 2011-11-01 DIAGNOSIS — IMO0002 Reserved for concepts with insufficient information to code with codable children: Secondary | ICD-10-CM | POA: Insufficient documentation

## 2011-11-01 DIAGNOSIS — S90425A Blister (nonthermal), left lesser toe(s), initial encounter: Secondary | ICD-10-CM

## 2011-11-01 MED ORDER — NYSTATIN 100000 UNIT/GM EX POWD: Freq: Three times a day (TID) | CUTANEOUS | Status: DC

## 2011-11-01 NOTE — ED Notes (Signed)
MD at bedside. 

## 2011-11-01 NOTE — Discharge Instructions (Signed)
Blisters Blisters are fluid-filled sacs that form within the skin. Common causes of blistering are friction, burns, and exposure to irritating chemicals. The fluid in the blister protects the underlying damaged skin. Most of the time it is not recommended that you open blisters. When a blister is opened, there is an increased chance for infection. Usually, a blister will open on its own. They then dry up and peel off within 10 days. If the blister is tense and uncomfortable (painful) the fluid may be drained. If it is drained the roof of the blister should be left intact. The draining should only be done by a medical professional under aseptic conditions. Poorly fitting shoes and boots can cause blisters by being too tight or too loose. Wearing extra socks or using tape, bandages, or pads over the blister-prone area helps prevent the problem by reducing friction. Blisters heal more slowly if you have diabetes or if you have problems with your circulation. You need to be careful about medical follow-up to prevent infection. HOME CARE INSTRUCTIONS  Protect areas where blisters have formed until the skin is healed. Use a special bandage with a hole cut in the middle around the blister. This reduces pressure and friction. When the blister breaks, trim off the loose skin and keep the area clean by washing it with soap daily. Soaking the blister or broken-open blister with diluted vinegar twice daily for 15 minutes will dry it up and speed the healing. Use 3 tablespoons of white vinegar per quart of water (45 mL white vinegar per liter of water). An antibiotic ointment and a bandage can be used to cover the area after soaking.  SEEK MEDICAL CARE IF:   You develop increased redness, pain, swelling, or drainage in the blistered area.   You develop a pus-like discharge from the blistered area, chills, or a fever.  MAKE SURE YOU:   Understand these instructions.   Will watch your condition.   Will get help  right away if you are not doing well or get worse.  Document Released: 06/20/2004 Document Revised: 05/02/2011 Document Reviewed: 05/18/2008 [facility] Patient Information 2012 [place], [place].Intertrigo Intertrigo is a skin irritation (inflammation) that happens in warm, moist areas of the body. It happens mostly between folds of skin or where skin rubs together. HOME CARE  Keep the affected area cool and dry.   Leave the skin folds open to air.   Put cotton or linen between the folds of skin.   Avoid tight clothing.   Wear open-toed shoes or sandals.   Use powder on the affected area as told by your doctor.   Only use medicated creams or pastes as told by your doctor.  GET HELP RIGHT AWAY IF:  The rash does not get better after 1 week of treatment.   The rash gets worse.   You have a fever or chills.  MAKE SURE YOU:  Understand these instructions.   Will watch your condition.   Will get help right away if you are not doing well or get worse.  Document Released: 06/15/2010 Document Revised: 05/02/2011 Document Reviewed: 06/15/2010 [facility] Patient Information 2012 [place], [place].

## 2011-11-01 NOTE — ED Notes (Signed)
Small clear blister on L second toe; pt reports no pain with blister

## 2011-11-01 NOTE — ED Notes (Signed)
Pt reports blister on left toe.  Reports history of diabetes and neuropathy and wants blister checked.

## 2011-11-01 NOTE — ED Notes (Signed)
Pt stable at discharge; foot care reviewed
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
## 2011-11-04 NOTE — ED Provider Notes (Signed)
Medical screening examination/treatment/procedure(s) were conducted as a shared visit with non-physician practitioner(s) and myself.  I personally evaluated the patient during the encounter  Sterile blister to dorsal toe. Hx DM.  Glynn Octave, MD 11/04/11 [phone]

## 2011-11-05 DIAGNOSIS — J449 Chronic obstructive pulmonary disease, unspecified: Secondary | ICD-10-CM | POA: Diagnosis not present

## 2011-11-05 DIAGNOSIS — I509 Heart failure, unspecified: Secondary | ICD-10-CM | POA: Diagnosis not present

## 2011-11-05 DIAGNOSIS — L0291 Cutaneous abscess, unspecified: Secondary | ICD-10-CM | POA: Diagnosis not present

## 2011-11-27 ENCOUNTER — Other Ambulatory Visit (HOSPITAL_COMMUNITY): Payer: Self-pay | Admitting: Pulmonary Disease

## 2011-11-27 DIAGNOSIS — I259 Chronic ischemic heart disease, unspecified: Secondary | ICD-10-CM | POA: Diagnosis not present

## 2011-11-27 DIAGNOSIS — J449 Chronic obstructive pulmonary disease, unspecified: Secondary | ICD-10-CM | POA: Diagnosis not present

## 2011-11-27 DIAGNOSIS — I509 Heart failure, unspecified: Secondary | ICD-10-CM | POA: Diagnosis not present

## 2011-11-27 DIAGNOSIS — I739 Peripheral vascular disease, unspecified: Secondary | ICD-10-CM

## 2011-11-27 DIAGNOSIS — H109 Unspecified conjunctivitis: Secondary | ICD-10-CM | POA: Diagnosis not present

## 2011-11-28 NOTE — ED Provider Notes (Signed)
Medical screening examination/treatment/procedure(s) were performed by non-physician practitioner and as supervising physician I was immediately available for consultation/collaboration.   Glynn Octave, MD 11/28/11 1041

## 2011-12-02 ENCOUNTER — Other Ambulatory Visit (HOSPITAL_COMMUNITY): Payer: Self-pay | Admitting: Pulmonary Disease

## 2011-12-02 ENCOUNTER — Ambulatory Visit (HOSPITAL_COMMUNITY)
Admission: RE | Admit: 2011-12-02 | Discharge: 2011-12-02 | Disposition: A | Discharge status: Home / Self Care | Payer: Medicare Other | Source: Ambulatory Visit | Attending: Pulmonary Disease | Admitting: Pulmonary Disease

## 2011-12-02 DIAGNOSIS — I739 Peripheral vascular disease, unspecified: Secondary | ICD-10-CM | POA: Diagnosis not present

## 2011-12-02 DIAGNOSIS — E119 Type 2 diabetes mellitus without complications: Secondary | ICD-10-CM | POA: Insufficient documentation

## 2011-12-02 DIAGNOSIS — I743 Embolism and thrombosis of arteries of the lower extremities: Secondary | ICD-10-CM | POA: Diagnosis not present

## 2011-12-02 DIAGNOSIS — I10 Essential (primary) hypertension: Secondary | ICD-10-CM | POA: Insufficient documentation

## 2011-12-16 DIAGNOSIS — E1129 Type 2 diabetes mellitus with other diabetic kidney complication: Secondary | ICD-10-CM | POA: Diagnosis not present

## 2011-12-16 DIAGNOSIS — E785 Hyperlipidemia, unspecified: Secondary | ICD-10-CM | POA: Diagnosis not present

## 2011-12-16 DIAGNOSIS — E039 Hypothyroidism, unspecified: Secondary | ICD-10-CM | POA: Diagnosis not present

## 2011-12-16 DIAGNOSIS — E2749 Other adrenocortical insufficiency: Secondary | ICD-10-CM | POA: Diagnosis not present

## 2011-12-23 DIAGNOSIS — E1129 Type 2 diabetes mellitus with other diabetic kidney complication: Secondary | ICD-10-CM | POA: Diagnosis not present

## 2011-12-23 DIAGNOSIS — E785 Hyperlipidemia, unspecified: Secondary | ICD-10-CM | POA: Diagnosis not present

## 2011-12-23 DIAGNOSIS — E669 Obesity, unspecified: Secondary | ICD-10-CM | POA: Diagnosis not present

## 2011-12-23 DIAGNOSIS — E2749 Other adrenocortical insufficiency: Secondary | ICD-10-CM | POA: Diagnosis not present

## 2011-12-23 DIAGNOSIS — E1165 Type 2 diabetes mellitus with hyperglycemia: Secondary | ICD-10-CM | POA: Diagnosis not present

## 2011-12-31 DIAGNOSIS — J449 Chronic obstructive pulmonary disease, unspecified: Secondary | ICD-10-CM | POA: Diagnosis not present

## 2011-12-31 DIAGNOSIS — I10 Essential (primary) hypertension: Secondary | ICD-10-CM | POA: Diagnosis not present

## 2011-12-31 DIAGNOSIS — R609 Edema, unspecified: Secondary | ICD-10-CM | POA: Diagnosis not present

## 2012-01-01 DIAGNOSIS — E119 Type 2 diabetes mellitus without complications: Secondary | ICD-10-CM | POA: Diagnosis not present

## 2012-01-01 DIAGNOSIS — E1149 Type 2 diabetes mellitus with other diabetic neurological complication: Secondary | ICD-10-CM | POA: Diagnosis not present

## 2012-01-07 ENCOUNTER — Encounter: Payer: Self-pay | Admitting: Cardiology

## 2012-01-07 ENCOUNTER — Ambulatory Visit (INDEPENDENT_AMBULATORY_CARE_PROVIDER_SITE_OTHER): Payer: Medicare Other | Admitting: Cardiology

## 2012-01-07 VITALS — BP 90/64 | HR 64 | Ht 64.0 in | Wt 303.4 lb

## 2012-01-07 DIAGNOSIS — E663 Overweight: Secondary | ICD-10-CM

## 2012-01-07 DIAGNOSIS — N189 Chronic kidney disease, unspecified: Secondary | ICD-10-CM

## 2012-01-07 DIAGNOSIS — E118 Type 2 diabetes mellitus with unspecified complications: Secondary | ICD-10-CM

## 2012-01-07 DIAGNOSIS — E785 Hyperlipidemia, unspecified: Secondary | ICD-10-CM

## 2012-01-07 DIAGNOSIS — I2510 Atherosclerotic heart disease of native coronary artery without angina pectoris: Secondary | ICD-10-CM | POA: Diagnosis not present

## 2012-01-07 DIAGNOSIS — E2749 Other adrenocortical insufficiency: Secondary | ICD-10-CM

## 2012-01-07 DIAGNOSIS — I5032 Chronic diastolic (congestive) heart failure: Secondary | ICD-10-CM

## 2012-01-07 DIAGNOSIS — E7800 Pure hypercholesterolemia, unspecified: Secondary | ICD-10-CM

## 2012-01-07 DIAGNOSIS — I10 Essential (primary) hypertension: Secondary | ICD-10-CM

## 2012-01-07 DIAGNOSIS — R609 Edema, unspecified: Secondary | ICD-10-CM

## 2012-01-07 DIAGNOSIS — E2740 Unspecified adrenocortical insufficiency: Secondary | ICD-10-CM

## 2012-01-07 MED ORDER — QUINAPRIL HCL 40 MG PO TABS: 20.0000 mg | ORAL_TABLET | Freq: Every day | ORAL | Status: DC

## 2012-01-07 MED ORDER — NITROGLYCERIN 0.4 MG SL SUBL: 0.4000 mg | SUBLINGUAL_TABLET | SUBLINGUAL | Status: DC | PRN

## 2012-01-07 MED ORDER — METOPROLOL SUCCINATE ER 50 MG PO TB24: 50.0000 mg | ORAL_TABLET | Freq: Every day | ORAL | Status: DC

## 2012-01-07 MED ORDER — ROSUVASTATIN CALCIUM 40 MG PO TABS: 40.0000 mg | ORAL_TABLET | Freq: Every day | ORAL | Status: DC

## 2012-01-07 NOTE — Assessment & Plan Note (Signed)
This is stable despite what she's been through. Dr. Juanetta Gosling is adjusting her diuretics which she says has helped her swelling immensely. She is seeing on a regular basis will not make any changes.
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
## 2012-01-07 NOTE — Assessment & Plan Note (Signed)
Stable. Continue secondary preventative therapy. 

## 2012-01-07 NOTE — Assessment & Plan Note (Signed)
If anything, she's probably too low right now but asymptomatic. With her complex medical program. I've made no changes.

## 2012-01-07 NOTE — Patient Instructions (Addendum)
**Note De-identified  Obfuscation** Your physician recommends that you continue on your current medications as directed. Please refer to the Current Medication list given to you today.  Your physician recommends that you schedule a follow-up appointment in: 1 year  

## 2012-01-09 DIAGNOSIS — I10 Essential (primary) hypertension: Secondary | ICD-10-CM | POA: Diagnosis not present

## 2012-01-09 DIAGNOSIS — I509 Heart failure, unspecified: Secondary | ICD-10-CM | POA: Diagnosis not present

## 2012-01-09 DIAGNOSIS — J449 Chronic obstructive pulmonary disease, unspecified: Secondary | ICD-10-CM | POA: Diagnosis not present

## 2012-01-14 DIAGNOSIS — R079 Chest pain, unspecified: Secondary | ICD-10-CM | POA: Diagnosis not present

## 2012-01-22 DIAGNOSIS — I10 Essential (primary) hypertension: Secondary | ICD-10-CM | POA: Diagnosis not present

## 2012-01-22 DIAGNOSIS — I259 Chronic ischemic heart disease, unspecified: Secondary | ICD-10-CM | POA: Diagnosis not present

## 2012-01-22 DIAGNOSIS — J449 Chronic obstructive pulmonary disease, unspecified: Secondary | ICD-10-CM | POA: Diagnosis not present

## 2012-03-04 DIAGNOSIS — J449 Chronic obstructive pulmonary disease, unspecified: Secondary | ICD-10-CM | POA: Diagnosis not present

## 2012-03-04 DIAGNOSIS — I10 Essential (primary) hypertension: Secondary | ICD-10-CM | POA: Diagnosis not present

## 2012-03-04 DIAGNOSIS — Z23 Encounter for immunization: Secondary | ICD-10-CM | POA: Diagnosis not present

## 2012-03-04 DIAGNOSIS — I259 Chronic ischemic heart disease, unspecified: Secondary | ICD-10-CM | POA: Diagnosis not present

## 2012-03-05 ENCOUNTER — Other Ambulatory Visit (HOSPITAL_COMMUNITY): Payer: Self-pay | Admitting: Pulmonary Disease

## 2012-03-05 DIAGNOSIS — Z139 Encounter for screening, unspecified: Secondary | ICD-10-CM

## 2012-03-10 ENCOUNTER — Ambulatory Visit (HOSPITAL_COMMUNITY)
Admission: RE | Admit: 2012-03-10 | Discharge: 2012-03-10 | Disposition: A | Discharge status: Home / Self Care | Payer: Medicare Other | Source: Ambulatory Visit | Attending: Pulmonary Disease | Admitting: Pulmonary Disease

## 2012-03-10 DIAGNOSIS — Z1231 Encounter for screening mammogram for malignant neoplasm of breast: Secondary | ICD-10-CM | POA: Diagnosis not present

## 2012-03-10 DIAGNOSIS — Z139 Encounter for screening, unspecified: Secondary | ICD-10-CM

## 2012-03-20 DIAGNOSIS — I10 Essential (primary) hypertension: Secondary | ICD-10-CM | POA: Diagnosis not present

## 2012-03-20 DIAGNOSIS — E1129 Type 2 diabetes mellitus with other diabetic kidney complication: Secondary | ICD-10-CM | POA: Diagnosis not present

## 2012-03-20 DIAGNOSIS — E669 Obesity, unspecified: Secondary | ICD-10-CM | POA: Diagnosis not present

## 2012-03-20 DIAGNOSIS — E2749 Other adrenocortical insufficiency: Secondary | ICD-10-CM | POA: Diagnosis not present

## 2012-03-20 DIAGNOSIS — E785 Hyperlipidemia, unspecified: Secondary | ICD-10-CM | POA: Diagnosis not present

## 2012-03-24 DIAGNOSIS — M1990 Unspecified osteoarthritis, unspecified site: Secondary | ICD-10-CM | POA: Diagnosis not present

## 2012-03-24 DIAGNOSIS — I10 Essential (primary) hypertension: Secondary | ICD-10-CM | POA: Diagnosis not present

## 2012-03-24 DIAGNOSIS — R609 Edema, unspecified: Secondary | ICD-10-CM | POA: Diagnosis not present

## 2012-03-24 DIAGNOSIS — E785 Hyperlipidemia, unspecified: Secondary | ICD-10-CM | POA: Diagnosis not present

## 2012-03-27 DIAGNOSIS — E2749 Other adrenocortical insufficiency: Secondary | ICD-10-CM | POA: Diagnosis not present

## 2012-03-27 DIAGNOSIS — I82409 Acute embolism and thrombosis of unspecified deep veins of unspecified lower extremity: Secondary | ICD-10-CM

## 2012-03-27 DIAGNOSIS — E1129 Type 2 diabetes mellitus with other diabetic kidney complication: Secondary | ICD-10-CM | POA: Diagnosis not present

## 2012-03-27 DIAGNOSIS — I10 Essential (primary) hypertension: Secondary | ICD-10-CM | POA: Diagnosis not present

## 2012-03-27 DIAGNOSIS — E669 Obesity, unspecified: Secondary | ICD-10-CM | POA: Diagnosis not present

## 2012-03-27 DIAGNOSIS — E1165 Type 2 diabetes mellitus with hyperglycemia: Secondary | ICD-10-CM | POA: Diagnosis not present

## 2012-03-27 DIAGNOSIS — E039 Hypothyroidism, unspecified: Secondary | ICD-10-CM | POA: Diagnosis not present

## 2012-03-27 HISTORY — DX: Acute embolism and thrombosis of unspecified deep veins of unspecified lower extremity: I82.409

## 2012-04-01 ENCOUNTER — Other Ambulatory Visit (HOSPITAL_COMMUNITY): Payer: Self-pay | Admitting: Podiatry

## 2012-04-01 ENCOUNTER — Encounter (HOSPITAL_COMMUNITY): Payer: Self-pay

## 2012-04-01 ENCOUNTER — Observation Stay (HOSPITAL_COMMUNITY)
Admission: AD | Admit: 2012-04-01 | Discharge: 2012-04-02 | Disposition: A | Discharge status: Home / Self Care | Payer: Medicare Other | Source: Ambulatory Visit | Attending: Pulmonary Disease | Admitting: Pulmonary Disease

## 2012-04-01 ENCOUNTER — Ambulatory Visit (HOSPITAL_COMMUNITY)
Admission: RE | Admit: 2012-04-01 | Discharge: 2012-04-01 | Disposition: A | Discharge status: Home / Self Care | Payer: Medicare Other | Source: Ambulatory Visit | Attending: Podiatry | Admitting: Podiatry

## 2012-04-01 DIAGNOSIS — I129 Hypertensive chronic kidney disease with stage 1 through stage 4 chronic kidney disease, or unspecified chronic kidney disease: Secondary | ICD-10-CM | POA: Diagnosis not present

## 2012-04-01 DIAGNOSIS — R609 Edema, unspecified: Secondary | ICD-10-CM

## 2012-04-01 DIAGNOSIS — N189 Chronic kidney disease, unspecified: Secondary | ICD-10-CM | POA: Diagnosis not present

## 2012-04-01 DIAGNOSIS — I82409 Acute embolism and thrombosis of unspecified deep veins of unspecified lower extremity: Secondary | ICD-10-CM | POA: Diagnosis not present

## 2012-04-01 DIAGNOSIS — E1149 Type 2 diabetes mellitus with other diabetic neurological complication: Secondary | ICD-10-CM | POA: Diagnosis not present

## 2012-04-01 DIAGNOSIS — L03116 Cellulitis of left lower limb: Secondary | ICD-10-CM

## 2012-04-01 DIAGNOSIS — I824Y9 Acute embolism and thrombosis of unspecified deep veins of unspecified proximal lower extremity: Secondary | ICD-10-CM | POA: Diagnosis not present

## 2012-04-01 DIAGNOSIS — E119 Type 2 diabetes mellitus without complications: Secondary | ICD-10-CM | POA: Insufficient documentation

## 2012-04-01 DIAGNOSIS — I10 Essential (primary) hypertension: Secondary | ICD-10-CM

## 2012-04-01 DIAGNOSIS — Z7901 Long term (current) use of anticoagulants: Secondary | ICD-10-CM | POA: Insufficient documentation

## 2012-04-01 DIAGNOSIS — E2740 Unspecified adrenocortical insufficiency: Secondary | ICD-10-CM

## 2012-04-01 DIAGNOSIS — E7800 Pure hypercholesterolemia, unspecified: Secondary | ICD-10-CM

## 2012-04-01 LAB — CBC
HCT: 38.1 % (ref 36.0–46.0)
MCH: 28.7 pg (ref 26.0–34.0)
MCV: 90.3 fL (ref 78.0–100.0)
Platelets: 169 10*3/uL (ref 150–400)
RBC: 4.22 MIL/uL (ref 3.87–5.11)
RDW: 14.8 % (ref 11.5–15.5)

## 2012-04-01 LAB — GLUCOSE, CAPILLARY: Glucose-Capillary: 155 mg/dL — ABNORMAL HIGH (ref 70–99)

## 2012-04-01 LAB — BASIC METABOLIC PANEL
BUN: 25 mg/dL — ABNORMAL HIGH (ref 6–23)
CO2: 28 mEq/L (ref 19–32)
Calcium: 9.3 mg/dL (ref 8.4–10.5)
Creatinine, Ser: 0.8 mg/dL (ref 0.50–1.10)

## 2012-04-01 MED ORDER — LISINOPRIL 10 MG PO TABS
20.0000 mg | ORAL_TABLET | Freq: Every day | ORAL | Status: DC
  Administered 2012-04-02: 20 mg via ORAL
  Filled 2012-04-01: qty 2

## 2012-04-01 MED ORDER — INSULIN DETEMIR 100 UNIT/ML ~~LOC~~ SOLN
30.0000 [IU] | Freq: Every day | SUBCUTANEOUS | Status: DC
  Administered 2012-04-01: 30 [IU] via SUBCUTANEOUS
  Filled 2012-04-01: qty 10

## 2012-04-01 MED ORDER — SERTRALINE HCL 50 MG PO TABS
50.0000 mg | ORAL_TABLET | Freq: Every day | ORAL | Status: DC
  Administered 2012-04-02: 50 mg via ORAL
  Filled 2012-04-01: qty 1

## 2012-04-01 MED ORDER — NAPROXEN 250 MG PO TABS
500.0000 mg | ORAL_TABLET | Freq: Two times a day (BID) | ORAL | Status: DC
  Administered 2012-04-02: 500 mg via ORAL
  Filled 2012-04-01: qty 2

## 2012-04-01 MED ORDER — PANTOPRAZOLE SODIUM 40 MG PO TBEC
80.0000 mg | DELAYED_RELEASE_TABLET | Freq: Every day | ORAL | Status: DC
  Administered 2012-04-02: 80 mg via ORAL
  Filled 2012-04-01: qty 2

## 2012-04-01 MED ORDER — SODIUM CHLORIDE 0.9 % IV SOLN: 250.0000 mL | INTRAVENOUS | Status: DC | PRN

## 2012-04-01 MED ORDER — RIVAROXABAN 10 MG PO TABS
15.0000 mg | ORAL_TABLET | Freq: Two times a day (BID) | ORAL | Status: DC
  Administered 2012-04-01 – 2012-04-02 (×2): 15 mg via ORAL
  Filled 2012-04-01 (×2): qty 2

## 2012-04-01 MED ORDER — LEVOTHYROXINE SODIUM 100 MCG PO TABS
200.0000 ug | ORAL_TABLET | Freq: Every day | ORAL | Status: DC
  Administered 2012-04-02: 200 ug via ORAL
  Filled 2012-04-01: qty 2

## 2012-04-01 MED ORDER — NYSTATIN 100000 UNIT/GM EX POWD
Freq: Three times a day (TID) | CUTANEOUS | Status: DC
  Administered 2012-04-01: 21:00:00 via TOPICAL
  Filled 2012-04-01: qty 15

## 2012-04-01 MED ORDER — LINAGLIPTIN 5 MG PO TABS
5.0000 mg | ORAL_TABLET | Freq: Every day | ORAL | Status: DC
  Administered 2012-04-02: 5 mg via ORAL
  Filled 2012-04-01: qty 1

## 2012-04-01 MED ORDER — FESOTERODINE FUMARATE ER 8 MG PO TB24
8.0000 mg | ORAL_TABLET | Freq: Every day | ORAL | Status: DC
  Administered 2012-04-02: 8 mg via ORAL
  Filled 2012-04-01 (×2): qty 1

## 2012-04-01 MED ORDER — MOMETASONE FURO-FORMOTEROL FUM 100-5 MCG/ACT IN AERO
2.0000 | INHALATION_SPRAY | Freq: Two times a day (BID) | RESPIRATORY_TRACT | Status: DC
  Administered 2012-04-01 – 2012-04-02 (×2): 2 via RESPIRATORY_TRACT
  Filled 2012-04-01: qty 8.8

## 2012-04-01 MED ORDER — SODIUM CHLORIDE 0.9 % IJ SOLN: 3.0000 mL | INTRAMUSCULAR | Status: DC | PRN

## 2012-04-01 MED ORDER — ONDANSETRON HCL 4 MG PO TABS: 4.0000 mg | ORAL_TABLET | Freq: Four times a day (QID) | ORAL | Status: DC | PRN

## 2012-04-01 MED ORDER — ALBUTEROL SULFATE HFA 108 (90 BASE) MCG/ACT IN AERS
2.0000 | INHALATION_SPRAY | Freq: Two times a day (BID) | RESPIRATORY_TRACT | Status: DC
  Administered 2012-04-01 – 2012-04-02 (×2): 2 via RESPIRATORY_TRACT
  Filled 2012-04-01: qty 6.7

## 2012-04-01 MED ORDER — INSULIN DETEMIR 100 UNIT/ML ~~LOC~~ SOLN
SUBCUTANEOUS | Status: AC
  Filled 2012-04-01: qty 10

## 2012-04-01 MED ORDER — ACETAMINOPHEN 650 MG RE SUPP: 650.0000 mg | Freq: Four times a day (QID) | RECTAL | Status: DC | PRN

## 2012-04-01 MED ORDER — GABAPENTIN 100 MG PO CAPS
100.0000 mg | ORAL_CAPSULE | Freq: Three times a day (TID) | ORAL | Status: DC
  Administered 2012-04-01 – 2012-04-02 (×2): 100 mg via ORAL
  Filled 2012-04-01 (×2): qty 1

## 2012-04-01 MED ORDER — ACETAMINOPHEN 325 MG PO TABS: 650.0000 mg | ORAL_TABLET | Freq: Four times a day (QID) | ORAL | Status: DC | PRN

## 2012-04-01 MED ORDER — FLUTICASONE PROPIONATE 50 MCG/ACT NA SUSP
2.0000 | Freq: Every day | NASAL | Status: DC
  Filled 2012-04-01: qty 16

## 2012-04-01 MED ORDER — ALUM & MAG HYDROXIDE-SIMETH 200-200-20 MG/5ML PO SUSP: 30.0000 mL | Freq: Four times a day (QID) | ORAL | Status: DC | PRN

## 2012-04-01 MED ORDER — HYDROCODONE-ACETAMINOPHEN 5-325 MG PO TABS: 1.0000 | ORAL_TABLET | ORAL | Status: DC | PRN

## 2012-04-01 MED ORDER — ATORVASTATIN CALCIUM 40 MG PO TABS
80.0000 mg | ORAL_TABLET | Freq: Every day | ORAL | Status: DC
  Administered 2012-04-01: 80 mg via ORAL
  Filled 2012-04-01: qty 2

## 2012-04-01 MED ORDER — DEXAMETHASONE 4 MG PO TABS
4.0000 mg | ORAL_TABLET | Freq: Every day | ORAL | Status: DC
  Administered 2012-04-02: 4 mg via ORAL
  Filled 2012-04-01 (×2): qty 1

## 2012-04-01 MED ORDER — ASPIRIN EC 81 MG PO TBEC
81.0000 mg | DELAYED_RELEASE_TABLET | Freq: Every day | ORAL | Status: DC
  Administered 2012-04-02: 81 mg via ORAL
  Filled 2012-04-01: qty 1

## 2012-04-01 MED ORDER — TORSEMIDE 20 MG PO TABS
100.0000 mg | ORAL_TABLET | Freq: Two times a day (BID) | ORAL | Status: DC
  Administered 2012-04-01 – 2012-04-02 (×2): 100 mg via ORAL
  Filled 2012-04-01 (×2): qty 5

## 2012-04-01 MED ORDER — ALPRAZOLAM 0.5 MG PO TABS: 0.5000 mg | ORAL_TABLET | Freq: Two times a day (BID) | ORAL | Status: DC | PRN

## 2012-04-01 MED ORDER — NYSTATIN 100000 UNIT/GM EX POWD
CUTANEOUS | Status: AC
  Filled 2012-04-01: qty 15

## 2012-04-01 MED ORDER — ONDANSETRON HCL 4 MG/2ML IJ SOLN: 4.0000 mg | Freq: Four times a day (QID) | INTRAMUSCULAR | Status: DC | PRN

## 2012-04-01 MED ORDER — QUINAPRIL HCL 10 MG PO TABS: 20.0000 mg | ORAL_TABLET | Freq: Every day | ORAL | Status: DC

## 2012-04-01 MED ORDER — SODIUM CHLORIDE 0.9 % IJ SOLN
3.0000 mL | Freq: Two times a day (BID) | INTRAMUSCULAR | Status: DC
  Administered 2012-04-01: 3 mL via INTRAVENOUS
  Filled 2012-04-01: qty 3

## 2012-04-01 MED ORDER — NITROGLYCERIN 0.4 MG SL SUBL: 0.4000 mg | SUBLINGUAL_TABLET | SUBLINGUAL | Status: DC | PRN

## 2012-04-01 MED ORDER — SODIUM CHLORIDE 0.9 % IJ SOLN
INTRAMUSCULAR | Status: AC
  Administered 2012-04-01: 10 mL
  Filled 2012-04-01: qty 3

## 2012-04-01 MED ORDER — PREDNISONE 10 MG PO TABS
5.0000 mg | ORAL_TABLET | Freq: Every day | ORAL | Status: DC
Start: 2012-04-02 — End: 2012-04-02
  Filled 2012-04-01: qty 1

## 2012-04-01 MED ORDER — INSULIN ASPART 100 UNIT/ML ~~LOC~~ SOLN: 10.0000 [IU] | Freq: Three times a day (TID) | SUBCUTANEOUS | Status: DC

## 2012-04-01 MED ORDER — MOMETASONE FURO-FORMOTEROL FUM 100-5 MCG/ACT IN AERO
INHALATION_SPRAY | RESPIRATORY_TRACT | Status: AC
  Filled 2012-04-01: qty 8.8

## 2012-04-01 MED ORDER — METOPROLOL SUCCINATE ER 50 MG PO TB24
50.0000 mg | ORAL_TABLET | Freq: Every day | ORAL | Status: DC
  Administered 2012-04-02: 50 mg via ORAL
  Filled 2012-04-01: qty 1
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
## 2012-04-02 DIAGNOSIS — I82409 Acute embolism and thrombosis of unspecified deep veins of unspecified lower extremity: Secondary | ICD-10-CM | POA: Diagnosis not present

## 2012-04-02 LAB — GLUCOSE, CAPILLARY: Glucose-Capillary: 133 mg/dL — ABNORMAL HIGH (ref 70–99)

## 2012-04-02 MED ORDER — RIVAROXABAN 15 MG PO TABS: 15.0000 mg | ORAL_TABLET | Freq: Two times a day (BID) | ORAL | Status: DC

## 2012-04-02 MED ORDER — INSULIN ASPART 100 UNIT/ML ~~LOC~~ SOLN
0.0000 [IU] | Freq: Three times a day (TID) | SUBCUTANEOUS | Status: DC
  Administered 2012-04-02: 2 [IU] via SUBCUTANEOUS

## 2012-04-02 NOTE — Progress Notes (Signed)
She says she feels okay. She has no new complaints. Her leg is still somewhat painful. She has started her anti-coagulation  She is awake and alert. She is cushingoid in appearance. She is morbidly obese. Her chest shows diminished breath sounds. Her heart is regular. She is mildly tender in both legs  She has a DVT. She is on anti-coagulation now.  I think it's okay for her to be discharged please see discharge summary for details

## 2012-04-02 NOTE — H&P (Signed)
Chief complaint: DVT  History of present illness: This is a 66 year old who has multiple medical problems and who went to see her podiatrist and was noted to have unilateral leg swelling which was different from before. She was sent for a venous study and was found to have DVT and is being brought in for observation to start her anticoagulations. She denies any shortness of breath she has no other new complaints. She has not had any fever or chills.  Her past medical history: She has hypothyroidism, hyperlipidemia, obesity, hypertension, coronary artery occlusive disease, adrenal insufficiency, diabetes, chronic renal failure, GERD, chronic venous stasis and multiple episodes of cellulitis and morbid obesity. Her medications are as listed and that list is correct  Her social history shows that she lives at home with her husband she does not use alcohol and she does not use tobacco.  Her family history is positive for heart disease and diabetes.  Her review of systems except as mentioned above is essentially negative. She says she has been coughing but hasn't coughed anything up  Her physical examination shows a well-developed well-nourished obese female who is in no acute distress. She does look cushingoid. Her blood pressure 127/80 her pulse is 80 and regular she is afebrile respirations about 20. Her HEENT shows her pupils are reactive nose and throat are clear mucous membranes are moist her neck is supple without masses. She does not have any bruits or JVD. Her chest is relatively clear. Her heart is regular without gallop. Her abdomen is soft without masses. Extremities show chronic venous stasis and she does have swelling but no tenderness. Central nervous system examination is grossly intact  My assessment is that now she has a blood clot in addition to the other multiple medical problems. My plan is to go ahead and get her started onto xarelto. I think is xarelto is going to be much safer for  her considering her multiple other illnesses multiple medications the number of times she has to take antibiotics et Karie Soda.

## 2012-04-02 NOTE — Progress Notes (Signed)
UR Chart Review Completed  

## 2012-04-06 DIAGNOSIS — I10 Essential (primary) hypertension: Secondary | ICD-10-CM | POA: Diagnosis not present

## 2012-04-06 DIAGNOSIS — E109 Type 1 diabetes mellitus without complications: Secondary | ICD-10-CM | POA: Diagnosis not present

## 2012-04-06 DIAGNOSIS — M545 Low back pain: Secondary | ICD-10-CM | POA: Diagnosis not present

## 2012-04-06 DIAGNOSIS — I259 Chronic ischemic heart disease, unspecified: Secondary | ICD-10-CM | POA: Diagnosis not present

## 2012-04-07 DIAGNOSIS — Z961 Presence of intraocular lens: Secondary | ICD-10-CM | POA: Diagnosis not present

## 2012-04-07 DIAGNOSIS — E119 Type 2 diabetes mellitus without complications: Secondary | ICD-10-CM | POA: Diagnosis not present

## 2012-04-08 NOTE — Discharge Summary (Signed)
Physician Discharge Summary  Patient ID: Ann [name] MRN: [id number] DOB/AGE: Jun 13, 1945 66 y.o. Primary Care Physician:Gabriana Wilmott L, MD Admit date: 04/01/2012 Discharge date: 04/08/2012    Discharge Diagnoses:  DVT Adrenal insufficiency Morbid obesity COPD Coronary artery occlusive disease Congestive heart failure Hypertension Recurrent cellulitis of the legs Anxiety Diabetes mellitus2 Active Problems:  * No active hospital problems. *      Medication List     As of 04/08/2012  8:39 AM    TAKE these medications         albuterol 108 (90 BASE) MCG/ACT inhaler   Commonly known as: PROVENTIL HFA;VENTOLIN HFA   Inhale 2 puffs into the lungs 2 (two) times daily. For shortness of breath      ALPRAZolam 0.5 MG tablet   Commonly known as: XANAX   Take 0.5 mg by mouth 2 (two) times daily as needed. For anxiety      aspirin EC 81 MG tablet   Take 81 mg by mouth daily.      dexamethasone 4 MG tablet   Commonly known as: DECADRON   Take 4 mg by mouth daily with breakfast.      esomeprazole 40 MG capsule   Commonly known as: NEXIUM   Take 40 mg by mouth 2 (two) times daily.      fluticasone 50 MCG/ACT nasal spray   Commonly known as: FLONASE   Place 1 spray into the nose daily.      Fluticasone-Salmeterol 250-50 MCG/DOSE Aepb   Commonly known as: ADVAIR   Inhale 1 puff into the lungs every 12 (twelve) hours.      gabapentin 100 MG capsule   Commonly known as: NEURONTIN   Take 100 mg by mouth 3 (three) times daily.      insulin aspart 100 UNIT/ML injection   Commonly known as: novoLOG   Inject 10-15 Units into the skin 3 (three) times daily before meals. According to Sliding Scale      LEVEMIR FLEXPEN [place]   Inject 30 Units into the skin at bedtime.      levothyroxine 200 MCG tablet   Commonly known as: SYNTHROID, LEVOTHROID   Take 200 mcg by mouth daily.      linagliptin 5 MG Tabs tablet   Commonly known as: TRADJENTA   Take 5 mg by mouth every  morning.      metoprolol succinate 50 MG 24 hr tablet   Commonly known as: TOPROL-XL   Take 1 tablet (50 mg total) by mouth daily.      naproxen 500 MG tablet   Commonly known as: NAPROSYN   Take 500 mg by mouth 2 (two) times daily with a meal.      nitroGLYCERIN 0.4 MG SL tablet   Commonly known as: NITROSTAT   Place 1 tablet (0.4 mg total) under the tongue every 5 (five) minutes as needed for chest pain.      nystatin powder   Commonly known as: MYCOSTATIN   Apply topically 3 (three) times daily. Apply to affected skin      predniSONE 10 MG tablet   Commonly known as: DELTASONE   Take 5 mg by mouth daily.      quinapril 20 MG tablet   Commonly known as: ACCUPRIL   Take 20 mg by mouth daily.      Rivaroxaban 15 MG Tabs tablet   Commonly known as: XARELTO   Take 1 tablet (15 mg total) by mouth 2 (two) times daily.  rosuvastatin 40 MG tablet   Commonly known as: CRESTOR   Take 1 tablet (40 mg total) by mouth at bedtime.      sertraline 50 MG tablet   Commonly known as: ZOLOFT   Take 50 mg by mouth daily.      tolterodine 4 MG 24 hr capsule   Commonly known as: DETROL LA   Take 4 mg by mouth every morning.      torsemide 100 MG tablet   Commonly known as: DEMADEX   Take 100 mg by mouth 2 (two) times daily.        Discharged Condition:improved    Consults:none  Significant Diagnostic Studies: US Venous Img Lower Unilateral Left  04/01/2012  *RADIOLOGY REPORT*  Clinical Data: Lower extremity pain and swelling on the left. Edema.  LEFT LOWER EXTREMITY VENOUS DUPLEX ULTRASOUND  Technique: Gray-scale sonography with graded compression, as well as color Doppler and duplex ultrasound, were performed to evaluate the deep venous system of the lower extremity on the left side from the level of the common femoral vein through the popliteal and proximal calf veins. Spectral Doppler was utilized to evaluate flow at rest and with distal augmentation maneuvers.   Comparison: None  Findings:  Normal compressibility of the left common femoral and superficial femoral veins is demonstrated.  Proximal calf veins not well seen due to body habitus.  Occlusive thrombus is apparent in the popliteal vein, with lack of compressibility.  IMPRESSION: 1. Occlusive deep vein thrombosis in the popliteal vein on the left.   Original Report Authenticated By: Gaylyn Rong, M.D.    Mm Digital Screening  03/11/2012  *RADIOLOGY REPORT*  Clinical Data: Screening.  DIGITAL BILATERAL SCREENING MAMMOGRAM WITH CAD  Comparison: Previous exams.  Findings: The breast tissue is almost entirely fatty. No suspicious masses, architectural distortion, or calcifications are present. With the examination is technically suboptimal due to persistent patient motion on the right mediolateral oblique view and difficulty positioning the patient due to body habitus and immobility.  Images were processed with CAD.  IMPRESSION: No mammographic evidence of malignancy.  A result letter of this screening mammogram will be mailed directly to the patient.  RECOMMENDATION: Screening mammogram in one year. (Code:SM-B-01Y)  BI-RADS CATEGORY 1:  Negative.   Original Report Authenticated By: Harrel Lemon, M.D.     Lab Results: Basic Metabolic Panel: No results found for this basename: NA:2,K:2,CL:2,CO2:2,GLUCOSE:2,BUN:2,CREATININE:2,CALCIUM:2,MG:2,PHOS:2 in the last 72 hours Liver Function Tests: No results found for this basename: AST:2,ALT:2,ALKPHOS:2,BILITOT:2,PROT:2,ALBUMIN:2 in the last 72 hours   CBC: No results found for this basename: WBC:2,NEUTROABS:2,HGB:2,HCT:2,MCV:2,PLT:2 in the last 72 hours  No results found for this or any previous visit (from the past 240 hour(s)).   Hospital Course: She was found to have DVT on outpatient exam. She is not felt to be a good Coumadin candidate because of her multiple comorbidities and multiple medications. She was started on  xarelto. She tolerated this  well and was ready for discharge the next morning  Discharge Exam: Blood pressure 112/71, pulse 52, temperature 97.3 F (36.3 C), temperature source Oral, resp. rate 20, height 5\' 4"  (1.626 m), weight 146.1 kg (322 lb 1.5 oz), SpO2 96.00%. Her right leg is still somewhat tender. There is no definite cord. She is morbidly obese. Her chest is relatively clear. Her heart is regular. She is somewhat cushingoid.  Disposition: Home      Discharge Orders    Future Orders Please Complete By Expires   Discharge patient  SignedFredirick Maudlin Pager [phone]  04/08/2012, 8:39 AM

## 2012-04-10 DIAGNOSIS — D3130 Benign neoplasm of unspecified choroid: Secondary | ICD-10-CM | POA: Diagnosis not present

## 2012-04-10 DIAGNOSIS — H43819 Vitreous degeneration, unspecified eye: Secondary | ICD-10-CM | POA: Diagnosis not present

## 2012-04-10 DIAGNOSIS — E119 Type 2 diabetes mellitus without complications: Secondary | ICD-10-CM | POA: Diagnosis not present

## 2012-04-17 ENCOUNTER — Other Ambulatory Visit (HOSPITAL_COMMUNITY): Payer: Self-pay | Admitting: Pulmonary Disease

## 2012-04-17 ENCOUNTER — Ambulatory Visit (HOSPITAL_COMMUNITY)
Admission: RE | Admit: 2012-04-17 | Discharge: 2012-04-17 | Disposition: A | Discharge status: Home / Self Care | Payer: Medicare Other | Source: Ambulatory Visit | Attending: Pulmonary Disease | Admitting: Pulmonary Disease

## 2012-04-17 DIAGNOSIS — R51 Headache: Secondary | ICD-10-CM

## 2012-04-17 DIAGNOSIS — R6889 Other general symptoms and signs: Secondary | ICD-10-CM | POA: Diagnosis not present

## 2012-04-17 DIAGNOSIS — R5381 Other malaise: Secondary | ICD-10-CM | POA: Diagnosis not present

## 2012-04-17 DIAGNOSIS — F29 Unspecified psychosis not due to a substance or known physiological condition: Secondary | ICD-10-CM | POA: Diagnosis not present

## 2012-04-17 DIAGNOSIS — G43909 Migraine, unspecified, not intractable, without status migrainosus: Secondary | ICD-10-CM | POA: Diagnosis not present

## 2012-04-17 DIAGNOSIS — R410 Disorientation, unspecified: Secondary | ICD-10-CM

## 2012-04-17 DIAGNOSIS — R42 Dizziness and giddiness: Secondary | ICD-10-CM | POA: Diagnosis not present

## 2012-04-22 ENCOUNTER — Other Ambulatory Visit (HOSPITAL_COMMUNITY): Payer: Self-pay | Admitting: Pulmonary Disease

## 2012-04-22 ENCOUNTER — Ambulatory Visit (HOSPITAL_COMMUNITY)
Admission: RE | Admit: 2012-04-22 | Discharge: 2012-04-22 | Disposition: A | Discharge status: Home / Self Care | Payer: Medicare Other | Source: Ambulatory Visit | Attending: Pulmonary Disease | Admitting: Pulmonary Disease

## 2012-04-22 DIAGNOSIS — M25519 Pain in unspecified shoulder: Secondary | ICD-10-CM | POA: Insufficient documentation

## 2012-04-22 DIAGNOSIS — M19019 Primary osteoarthritis, unspecified shoulder: Secondary | ICD-10-CM | POA: Diagnosis not present

## 2012-04-22 DIAGNOSIS — I259 Chronic ischemic heart disease, unspecified: Secondary | ICD-10-CM | POA: Diagnosis not present

## 2012-04-22 DIAGNOSIS — I10 Essential (primary) hypertension: Secondary | ICD-10-CM | POA: Diagnosis not present

## 2012-04-22 DIAGNOSIS — E785 Hyperlipidemia, unspecified: Secondary | ICD-10-CM | POA: Diagnosis not present

## 2012-04-22 DIAGNOSIS — R944 Abnormal results of kidney function studies: Secondary | ICD-10-CM | POA: Diagnosis not present

## 2012-04-22 DIAGNOSIS — T148XXA Other injury of unspecified body region, initial encounter: Secondary | ICD-10-CM | POA: Diagnosis not present

## 2012-04-22 DIAGNOSIS — M25511 Pain in right shoulder: Secondary | ICD-10-CM

## 2012-04-22 DIAGNOSIS — I82409 Acute embolism and thrombosis of unspecified deep veins of unspecified lower extremity: Secondary | ICD-10-CM | POA: Diagnosis not present

## 2012-04-22 DIAGNOSIS — J449 Chronic obstructive pulmonary disease, unspecified: Secondary | ICD-10-CM | POA: Diagnosis not present

## 2012-04-22 DIAGNOSIS — E119 Type 2 diabetes mellitus without complications: Secondary | ICD-10-CM | POA: Diagnosis not present

## 2012-04-28 DIAGNOSIS — E119 Type 2 diabetes mellitus without complications: Secondary | ICD-10-CM | POA: Diagnosis not present

## 2012-04-28 DIAGNOSIS — IMO0002 Reserved for concepts with insufficient information to code with codable children: Secondary | ICD-10-CM | POA: Diagnosis not present

## 2012-04-28 DIAGNOSIS — S60919A Unspecified superficial injury of unspecified wrist, initial encounter: Secondary | ICD-10-CM | POA: Diagnosis not present

## 2012-04-28 DIAGNOSIS — M810 Age-related osteoporosis without current pathological fracture: Secondary | ICD-10-CM | POA: Diagnosis not present

## 2012-04-28 DIAGNOSIS — I2510 Atherosclerotic heart disease of native coronary artery without angina pectoris: Secondary | ICD-10-CM | POA: Diagnosis not present

## 2012-04-28 DIAGNOSIS — J449 Chronic obstructive pulmonary disease, unspecified: Secondary | ICD-10-CM | POA: Diagnosis not present

## 2012-05-04 DIAGNOSIS — E785 Hyperlipidemia, unspecified: Secondary | ICD-10-CM | POA: Diagnosis not present

## 2012-05-04 DIAGNOSIS — I259 Chronic ischemic heart disease, unspecified: Secondary | ICD-10-CM | POA: Diagnosis not present

## 2012-05-04 DIAGNOSIS — R609 Edema, unspecified: Secondary | ICD-10-CM | POA: Diagnosis not present

## 2012-05-04 DIAGNOSIS — J449 Chronic obstructive pulmonary disease, unspecified: Secondary | ICD-10-CM | POA: Diagnosis not present

## 2012-05-05 DIAGNOSIS — E785 Hyperlipidemia, unspecified: Secondary | ICD-10-CM | POA: Diagnosis not present

## 2012-05-05 DIAGNOSIS — E669 Obesity, unspecified: Secondary | ICD-10-CM | POA: Diagnosis not present

## 2012-05-05 DIAGNOSIS — R609 Edema, unspecified: Secondary | ICD-10-CM | POA: Diagnosis not present

## 2012-05-05 DIAGNOSIS — I10 Essential (primary) hypertension: Secondary | ICD-10-CM | POA: Diagnosis not present

## 2012-05-05 DIAGNOSIS — I259 Chronic ischemic heart disease, unspecified: Secondary | ICD-10-CM | POA: Diagnosis not present

## 2012-05-27 DIAGNOSIS — K621 Rectal polyp: Secondary | ICD-10-CM

## 2012-05-27 DIAGNOSIS — R748 Abnormal levels of other serum enzymes: Secondary | ICD-10-CM

## 2012-05-27 DIAGNOSIS — K5792 Diverticulitis of intestine, part unspecified, without perforation or abscess without bleeding: Secondary | ICD-10-CM

## 2012-05-27 HISTORY — DX: Diverticulitis of intestine, part unspecified, without perforation or abscess without bleeding: K57.92

## 2012-05-27 HISTORY — DX: Abnormal levels of other serum enzymes: R74.8

## 2012-05-27 HISTORY — DX: Rectal polyp: K62.1

## 2012-06-04 DIAGNOSIS — J449 Chronic obstructive pulmonary disease, unspecified: Secondary | ICD-10-CM | POA: Diagnosis not present

## 2012-06-04 DIAGNOSIS — I10 Essential (primary) hypertension: Secondary | ICD-10-CM | POA: Diagnosis not present

## 2012-06-04 DIAGNOSIS — R609 Edema, unspecified: Secondary | ICD-10-CM | POA: Diagnosis not present

## 2012-06-04 DIAGNOSIS — I509 Heart failure, unspecified: Secondary | ICD-10-CM | POA: Diagnosis not present

## 2012-06-08 ENCOUNTER — Ambulatory Visit (INDEPENDENT_AMBULATORY_CARE_PROVIDER_SITE_OTHER): Payer: Medicare Other | Admitting: Gastroenterology

## 2012-06-08 ENCOUNTER — Encounter: Payer: Self-pay | Admitting: Gastroenterology

## 2012-06-08 VITALS — BP 110/72 | HR 76

## 2012-06-08 DIAGNOSIS — R195 Other fecal abnormalities: Secondary | ICD-10-CM

## 2012-06-08 DIAGNOSIS — I509 Heart failure, unspecified: Secondary | ICD-10-CM | POA: Diagnosis not present

## 2012-06-08 DIAGNOSIS — I10 Essential (primary) hypertension: Secondary | ICD-10-CM | POA: Diagnosis not present

## 2012-06-08 DIAGNOSIS — Z86010 Personal history of colonic polyps: Secondary | ICD-10-CM

## 2012-06-08 DIAGNOSIS — R609 Edema, unspecified: Secondary | ICD-10-CM | POA: Diagnosis not present

## 2012-06-08 DIAGNOSIS — J449 Chronic obstructive pulmonary disease, unspecified: Secondary | ICD-10-CM | POA: Diagnosis not present

## 2012-06-08 NOTE — Patient Instructions (Addendum)
You have been scheduled for a Barium Enema at [facility] Radiology departmentl on 06/12/12 at 9:00am. Please arrive 15 minutes prior to your appointment for registration. This test requires you to go get a prep kit from the Radiology department at [facility] several days prior to the scheduled test. If you need to call and cancel or reschedule please call [phone] and ask for the Radiology department.  cc: Kari Baars, MD

## 2012-06-08 NOTE — Progress Notes (Signed)
History of Present Illness: This is a 67 year old female with multiple comorbidities who was found to have asymptomatic Hemoccult positive stool on screening. She has a history of adenomatous colon polyps in 2002 at 2008. Last colonoscopy was in January 2008 showed 2 small adenomatous colon polyps sigmoid colon diverticulosis and internal hemorrhoids. She underwent upper endoscopy in March 2013 showing Candida esophagitis and gastritis. Denies weight loss, abdominal pain, constipation, diarrhea, change in stool caliber, melena, hematochezia, nausea, vomiting, dysphagia, reflux symptoms, chest pain.  Current Medications, Allergies, Past Medical History, Past Surgical History, Family History and Social History were reviewed in [employer] record.  Physical Exam: General: Well developed , well nourished, obese, on a scooter, no acute distress Head: Normocephalic and atraumatic Eyes:  sclerae anicteric, EOMI Ears: Normal auditory acuity Mouth: No deformity or lesions Lungs: Decreased breath sounds bilaterally Heart: Regular rate and rhythm; no murmurs, rubs or bruits Abdomen: Soft, non tender and non distended. No masses, hepatosplenomegaly or hernias noted. Normal Bowel sounds Musculoskeletal: Symmetrical with no gross deformities, limited mobility, uses a scooter Pulses:  Normal pulses noted Extremities: No clubbing, cyanosis, edema or deformities noted Neurological: Alert oriented x 4, grossly nonfocal Psychological:  Alert and cooperative. Normal mood and affect  Assessment and Recommendations:  1. Asymptomatic heme positive stool. Personal history of adenomatous colon polyps. Hemoccult-positive stool likely from hemorrhoids or gastritis. Multiple comorbidities which increases her cardiopulmonary risk for sedation and colonoscopy. We discussed the risks, benefits and alternatives to colonoscopy or air-contrast barium enema for further evaluation and she agrees to proceed  with an air-contrast barium enema as recommended. Given her significant comorbidities she is not appropriate candidate for routine colorectal cancer screening and surveillance colonoscopies so we will not plan for any future screening or surveillance colonoscopies. I also recommend discontinuing routine Hemoccult testing for screening.

## 2012-06-11 ENCOUNTER — Ambulatory Visit (INDEPENDENT_AMBULATORY_CARE_PROVIDER_SITE_OTHER): Payer: Medicare Other | Admitting: Otolaryngology

## 2012-06-11 DIAGNOSIS — R490 Dysphonia: Secondary | ICD-10-CM

## 2012-06-12 ENCOUNTER — Emergency Department (HOSPITAL_COMMUNITY): Payer: Medicare Other

## 2012-06-12 ENCOUNTER — Observation Stay (HOSPITAL_COMMUNITY)
Admission: EM | Admit: 2012-06-12 | Discharge: 2012-06-16 | Disposition: A | Discharge status: Skilled Nursing Facility | Payer: Medicare Other | Attending: Internal Medicine | Admitting: Internal Medicine

## 2012-06-12 ENCOUNTER — Encounter (HOSPITAL_COMMUNITY): Payer: Self-pay | Admitting: *Deleted

## 2012-06-12 ENCOUNTER — Other Ambulatory Visit: Payer: Self-pay

## 2012-06-12 ENCOUNTER — Ambulatory Visit (HOSPITAL_COMMUNITY)
Admission: RE | Admit: 2012-06-12 | Discharge: 2012-06-12 | Disposition: A | Discharge status: Home / Self Care | Payer: Medicare Other | Source: Ambulatory Visit | Attending: Gastroenterology | Admitting: Gastroenterology

## 2012-06-12 DIAGNOSIS — IMO0002 Reserved for concepts with insufficient information to code with codable children: Secondary | ICD-10-CM | POA: Diagnosis not present

## 2012-06-12 DIAGNOSIS — T148XXA Other injury of unspecified body region, initial encounter: Secondary | ICD-10-CM

## 2012-06-12 DIAGNOSIS — M25569 Pain in unspecified knee: Secondary | ICD-10-CM | POA: Insufficient documentation

## 2012-06-12 DIAGNOSIS — R5381 Other malaise: Secondary | ICD-10-CM | POA: Diagnosis not present

## 2012-06-12 DIAGNOSIS — I5032 Chronic diastolic (congestive) heart failure: Secondary | ICD-10-CM | POA: Diagnosis not present

## 2012-06-12 DIAGNOSIS — E782 Mixed hyperlipidemia: Secondary | ICD-10-CM | POA: Diagnosis present

## 2012-06-12 DIAGNOSIS — E785 Hyperlipidemia, unspecified: Secondary | ICD-10-CM

## 2012-06-12 DIAGNOSIS — Z794 Long term (current) use of insulin: Secondary | ICD-10-CM | POA: Diagnosis not present

## 2012-06-12 DIAGNOSIS — Z01812 Encounter for preprocedural laboratory examination: Secondary | ICD-10-CM | POA: Insufficient documentation

## 2012-06-12 DIAGNOSIS — K921 Melena: Secondary | ICD-10-CM | POA: Insufficient documentation

## 2012-06-12 DIAGNOSIS — I82409 Acute embolism and thrombosis of unspecified deep veins of unspecified lower extremity: Secondary | ICD-10-CM | POA: Diagnosis not present

## 2012-06-12 DIAGNOSIS — N289 Disorder of kidney and ureter, unspecified: Secondary | ICD-10-CM

## 2012-06-12 DIAGNOSIS — Z01818 Encounter for other preprocedural examination: Secondary | ICD-10-CM | POA: Insufficient documentation

## 2012-06-12 DIAGNOSIS — M79609 Pain in unspecified limb: Secondary | ICD-10-CM | POA: Insufficient documentation

## 2012-06-12 DIAGNOSIS — M6740 Ganglion, unspecified site: Secondary | ICD-10-CM

## 2012-06-12 DIAGNOSIS — E1165 Type 2 diabetes mellitus with hyperglycemia: Secondary | ICD-10-CM | POA: Diagnosis present

## 2012-06-12 DIAGNOSIS — M5137 Other intervertebral disc degeneration, lumbosacral region: Secondary | ICD-10-CM | POA: Diagnosis not present

## 2012-06-12 DIAGNOSIS — J4489 Other specified chronic obstructive pulmonary disease: Secondary | ICD-10-CM | POA: Insufficient documentation

## 2012-06-12 DIAGNOSIS — M25559 Pain in unspecified hip: Secondary | ICD-10-CM | POA: Insufficient documentation

## 2012-06-12 DIAGNOSIS — I517 Cardiomegaly: Secondary | ICD-10-CM | POA: Insufficient documentation

## 2012-06-12 DIAGNOSIS — I959 Hypotension, unspecified: Secondary | ICD-10-CM | POA: Diagnosis not present

## 2012-06-12 DIAGNOSIS — Z86010 Personal history of colonic polyps: Secondary | ICD-10-CM

## 2012-06-12 DIAGNOSIS — N19 Unspecified kidney failure: Secondary | ICD-10-CM | POA: Diagnosis not present

## 2012-06-12 DIAGNOSIS — K219 Gastro-esophageal reflux disease without esophagitis: Secondary | ICD-10-CM | POA: Diagnosis not present

## 2012-06-12 DIAGNOSIS — I2510 Atherosclerotic heart disease of native coronary artery without angina pectoris: Secondary | ICD-10-CM

## 2012-06-12 DIAGNOSIS — N189 Chronic kidney disease, unspecified: Secondary | ICD-10-CM

## 2012-06-12 DIAGNOSIS — R609 Edema, unspecified: Secondary | ICD-10-CM

## 2012-06-12 DIAGNOSIS — R296 Repeated falls: Secondary | ICD-10-CM | POA: Insufficient documentation

## 2012-06-12 DIAGNOSIS — K5730 Diverticulosis of large intestine without perforation or abscess without bleeding: Secondary | ICD-10-CM | POA: Insufficient documentation

## 2012-06-12 DIAGNOSIS — R933 Abnormal findings on diagnostic imaging of other parts of digestive tract: Secondary | ICD-10-CM | POA: Insufficient documentation

## 2012-06-12 DIAGNOSIS — I10 Essential (primary) hypertension: Secondary | ICD-10-CM

## 2012-06-12 DIAGNOSIS — R195 Other fecal abnormalities: Secondary | ICD-10-CM

## 2012-06-12 DIAGNOSIS — E118 Type 2 diabetes mellitus with unspecified complications: Secondary | ICD-10-CM

## 2012-06-12 DIAGNOSIS — R29898 Other symptoms and signs involving the musculoskeletal system: Principal | ICD-10-CM

## 2012-06-12 DIAGNOSIS — N179 Acute kidney failure, unspecified: Secondary | ICD-10-CM | POA: Diagnosis not present

## 2012-06-12 DIAGNOSIS — J449 Chronic obstructive pulmonary disease, unspecified: Secondary | ICD-10-CM | POA: Diagnosis not present

## 2012-06-12 DIAGNOSIS — Z7901 Long term (current) use of anticoagulants: Secondary | ICD-10-CM | POA: Insufficient documentation

## 2012-06-12 DIAGNOSIS — E2740 Unspecified adrenocortical insufficiency: Secondary | ICD-10-CM

## 2012-06-12 DIAGNOSIS — Z01810 Encounter for preprocedural cardiovascular examination: Secondary | ICD-10-CM | POA: Insufficient documentation

## 2012-06-12 DIAGNOSIS — I129 Hypertensive chronic kidney disease with stage 1 through stage 4 chronic kidney disease, or unspecified chronic kidney disease: Secondary | ICD-10-CM | POA: Diagnosis not present

## 2012-06-12 DIAGNOSIS — S199XXA Unspecified injury of neck, initial encounter: Secondary | ICD-10-CM | POA: Diagnosis not present

## 2012-06-12 DIAGNOSIS — E7800 Pure hypercholesterolemia, unspecified: Secondary | ICD-10-CM

## 2012-06-12 DIAGNOSIS — L03116 Cellulitis of left lower limb: Secondary | ICD-10-CM

## 2012-06-12 DIAGNOSIS — S0993XA Unspecified injury of face, initial encounter: Secondary | ICD-10-CM | POA: Diagnosis not present

## 2012-06-12 DIAGNOSIS — E663 Overweight: Secondary | ICD-10-CM

## 2012-06-12 DIAGNOSIS — M51379 Other intervertebral disc degeneration, lumbosacral region without mention of lumbar back pain or lower extremity pain: Secondary | ICD-10-CM | POA: Insufficient documentation

## 2012-06-12 DIAGNOSIS — E039 Hypothyroidism, unspecified: Secondary | ICD-10-CM

## 2012-06-12 HISTORY — DX: Dorsalgia, unspecified: M54.9

## 2012-06-12 HISTORY — DX: Long term (current) use of anticoagulants: Z79.01

## 2012-06-12 HISTORY — DX: Gastro-esophageal reflux disease without esophagitis: K21.9

## 2012-06-12 HISTORY — DX: Atherosclerotic heart disease of native coronary artery without angina pectoris: I25.10

## 2012-06-12 HISTORY — DX: Reserved for concepts with insufficient information to code with codable children: IMO0002

## 2012-06-12 HISTORY — DX: Other chronic pain: G89.29

## 2012-06-12 HISTORY — DX: Cervicalgia: M54.2

## 2012-06-12 HISTORY — DX: Hyperlipidemia, unspecified: E78.5

## 2012-06-12 LAB — CBC WITH DIFFERENTIAL/PLATELET
Basophils Absolute: 0 10*3/uL (ref 0.0–0.1)
Eosinophils Absolute: 0 10*3/uL (ref 0.0–0.7)
Eosinophils Relative: 0 % (ref 0–5)
Lymphocytes Relative: 9 % — ABNORMAL LOW (ref 12–46)
Lymphs Abs: 1.2 10*3/uL (ref 0.7–4.0)
MCV: 84.7 fL (ref 78.0–100.0)
Neutrophils Relative %: 85 % — ABNORMAL HIGH (ref 43–77)
Platelets: 165 10*3/uL (ref 150–400)
RBC: 4.04 MIL/uL (ref 3.87–5.11)
RDW: 16.2 % — ABNORMAL HIGH (ref 11.5–15.5)
WBC: 13.3 10*3/uL — ABNORMAL HIGH (ref 4.0–10.5)

## 2012-06-12 LAB — URINALYSIS, ROUTINE W REFLEX MICROSCOPIC
Bilirubin Urine: NEGATIVE
Glucose, UA: NEGATIVE mg/dL
Ketones, ur: NEGATIVE mg/dL
Urobilinogen, UA: 0.2 mg/dL (ref 0.0–1.0)

## 2012-06-12 LAB — BASIC METABOLIC PANEL
BUN: 46 mg/dL — ABNORMAL HIGH (ref 6–23)
GFR calc Af Amer: 27 mL/min — ABNORMAL LOW (ref >90)
GFR calc non Af Amer: 24 mL/min — ABNORMAL LOW (ref >90)
Potassium: 3.2 mEq/L — ABNORMAL LOW (ref 3.5–5.1)
Sodium: 140 mEq/L (ref 135–145)

## 2012-06-12 LAB — TROPONIN I: Troponin I: <0.3 ng/mL (ref <0.30)

## 2012-06-12 LAB — URINE MICROSCOPIC-ADD ON

## 2012-06-12 MED ORDER — POTASSIUM CHLORIDE CRYS ER 20 MEQ PO TBCR
40.0000 meq | EXTENDED_RELEASE_TABLET | Freq: Once | ORAL | Status: AC
  Administered 2012-06-12: 40 meq via ORAL
  Filled 2012-06-12: qty 2

## 2012-06-12 MED ORDER — PREDNISONE 5 MG PO TABS
5.0000 mg | ORAL_TABLET | Freq: Every day | ORAL | Status: DC
  Administered 2012-06-13 – 2012-06-16 (×4): 5 mg via ORAL
  Filled 2012-06-12 (×4): qty 1

## 2012-06-12 MED ORDER — ALBUTEROL SULFATE HFA 108 (90 BASE) MCG/ACT IN AERS
2.0000 | INHALATION_SPRAY | Freq: Two times a day (BID) | RESPIRATORY_TRACT | Status: DC
  Administered 2012-06-13 – 2012-06-16 (×7): 2 via RESPIRATORY_TRACT
  Filled 2012-06-12 (×2): qty 6.7

## 2012-06-12 MED ORDER — INSULIN ASPART 100 UNIT/ML ~~LOC~~ SOLN: 10.0000 [IU] | Freq: Three times a day (TID) | SUBCUTANEOUS | Status: DC

## 2012-06-12 MED ORDER — INSULIN DETEMIR 100 UNIT/ML ~~LOC~~ SOLN
30.0000 [IU] | Freq: Every day | SUBCUTANEOUS | Status: DC
  Administered 2012-06-12 – 2012-06-15 (×4): 30 [IU] via SUBCUTANEOUS
  Filled 2012-06-12 (×2): qty 10

## 2012-06-12 MED ORDER — LINAGLIPTIN 5 MG PO TABS
5.0000 mg | ORAL_TABLET | Freq: Every day | ORAL | Status: DC
  Administered 2012-06-13 – 2012-06-16 (×4): 5 mg via ORAL
  Filled 2012-06-12 (×4): qty 1

## 2012-06-12 MED ORDER — FESOTERODINE FUMARATE ER 4 MG PO TB24
4.0000 mg | ORAL_TABLET | Freq: Every day | ORAL | Status: DC
  Administered 2012-06-13 – 2012-06-16 (×3): 4 mg via ORAL
  Filled 2012-06-12 (×4): qty 1

## 2012-06-12 MED ORDER — ASPIRIN EC 81 MG PO TBEC
81.0000 mg | DELAYED_RELEASE_TABLET | Freq: Every day | ORAL | Status: DC
  Administered 2012-06-13 – 2012-06-16 (×4): 81 mg via ORAL
  Filled 2012-06-12 (×4): qty 1

## 2012-06-12 MED ORDER — ALPRAZOLAM 0.5 MG PO TABS
0.5000 mg | ORAL_TABLET | Freq: Three times a day (TID) | ORAL | Status: DC | PRN
  Administered 2012-06-12 – 2012-06-14 (×3): 0.5 mg via ORAL
  Filled 2012-06-12 (×3): qty 1

## 2012-06-12 MED ORDER — FLUTICASONE PROPIONATE 50 MCG/ACT NA SUSP
1.0000 | Freq: Every day | NASAL | Status: DC
  Administered 2012-06-13: 1 via NASAL
  Filled 2012-06-12: qty 16

## 2012-06-12 MED ORDER — TORSEMIDE 20 MG PO TABS: 100.0000 mg | ORAL_TABLET | Freq: Two times a day (BID) | ORAL | Status: DC

## 2012-06-12 MED ORDER — MOMETASONE FURO-FORMOTEROL FUM 100-5 MCG/ACT IN AERO
2.0000 | INHALATION_SPRAY | Freq: Two times a day (BID) | RESPIRATORY_TRACT | Status: DC
  Administered 2012-06-13 – 2012-06-16 (×6): 2 via RESPIRATORY_TRACT
  Filled 2012-06-12 (×2): qty 8.8

## 2012-06-12 MED ORDER — DEXAMETHASONE 4 MG PO TABS
4.0000 mg | ORAL_TABLET | Freq: Every day | ORAL | Status: DC
  Filled 2012-06-12 (×3): qty 1

## 2012-06-12 MED ORDER — SODIUM CHLORIDE 0.9 % IV SOLN
INTRAVENOUS | Status: DC
  Administered 2012-06-12 – 2012-06-14 (×2): via INTRAVENOUS

## 2012-06-12 MED ORDER — NITROGLYCERIN 0.4 MG SL SUBL
0.4000 mg | SUBLINGUAL_TABLET | SUBLINGUAL | Status: DC | PRN
Start: 2012-06-12 — End: 2012-06-16

## 2012-06-12 MED ORDER — GABAPENTIN 100 MG PO CAPS
100.0000 mg | ORAL_CAPSULE | Freq: Three times a day (TID) | ORAL | Status: DC
  Administered 2012-06-12 – 2012-06-16 (×11): 100 mg via ORAL
  Filled 2012-06-12 (×13): qty 1

## 2012-06-12 MED ORDER — PANTOPRAZOLE SODIUM 40 MG PO TBEC
80.0000 mg | DELAYED_RELEASE_TABLET | Freq: Every day | ORAL | Status: DC
  Administered 2012-06-13 – 2012-06-16 (×4): 80 mg via ORAL
  Filled 2012-06-12 (×2): qty 2
  Filled 2012-06-12: qty 1
  Filled 2012-06-12: qty 2

## 2012-06-12 MED ORDER — SODIUM CHLORIDE 0.9 % IV BOLUS (SEPSIS)
250.0000 mL | Freq: Once | INTRAVENOUS | Status: AC
  Administered 2012-06-12: 250 mL via INTRAVENOUS

## 2012-06-12 MED ORDER — SERTRALINE HCL 50 MG PO TABS
50.0000 mg | ORAL_TABLET | Freq: Every day | ORAL | Status: DC
  Administered 2012-06-13 – 2012-06-16 (×4): 50 mg via ORAL
  Filled 2012-06-12 (×4): qty 1

## 2012-06-12 MED ORDER — LEVOTHYROXINE SODIUM 200 MCG PO TABS
200.0000 ug | ORAL_TABLET | Freq: Every day | ORAL | Status: DC
  Administered 2012-06-13 – 2012-06-16 (×4): 200 ug via ORAL
  Filled 2012-06-12: qty 1
  Filled 2012-06-12 (×2): qty 2
  Filled 2012-06-12 (×2): qty 1

## 2012-06-12 MED ORDER — ATORVASTATIN CALCIUM 40 MG PO TABS
80.0000 mg | ORAL_TABLET | Freq: Every day | ORAL | Status: DC
  Administered 2012-06-12 – 2012-06-15 (×4): 80 mg via ORAL
  Filled 2012-06-12 (×5): qty 2

## 2012-06-12 MED ORDER — METOPROLOL SUCCINATE ER 50 MG PO TB24
50.0000 mg | ORAL_TABLET | Freq: Every day | ORAL | Status: DC
  Administered 2012-06-13 – 2012-06-16 (×3): 50 mg via ORAL
  Filled 2012-06-12 (×4): qty 1
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
## 2012-06-12 NOTE — ED Notes (Signed)
Pt in xray

## 2012-06-12 NOTE — ED Notes (Signed)
MD at bedside. 
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
## 2012-06-12 NOTE — ED Provider Notes (Signed)
I assumed care of the patient from Dr. Clarene Duke.  She was admitted for further E/M of her renal dysfunction and hypotension.  Angiocath insertion Performed by: Gerhard Munch  Consent: Verbal consent obtained. Risks and benefits: risks, benefits and alternatives were discussed Time out: Immediately prior to procedure a "time out" was called to verify the correct patient, procedure, equipment, support staff and site/side marked as required.  Preparation: Patient was prepped and draped in the usual sterile fashion.  Vein Location: R deep bracial  Ultrasound Guided  Gauge: 20  Normal blood return and flush without difficulty Patient tolerance: Patient tolerated the procedure well with no immediate complications.     Gerhard Munch, MD 06/12/12 1843

## 2012-06-12 NOTE — ED Notes (Signed)
Husband concerned that pt has not eaten since yesterday, explained to husband CBG checked and was normal at 95
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
## 2012-06-12 NOTE — ED Notes (Signed)
EDP made aware of no IV access after multiple attempts of getting access, EDP ordered for fluid trial, ginger ale provided for pt

## 2012-06-12 NOTE — ED Notes (Signed)
Pt c/o dizziness still while laying in bed, pt "twisted" her body while getting in a car today after a colonoscopy done this morning, states that she hurts all over

## 2012-06-12 NOTE — ED Notes (Signed)
Pt complained of dizziness when sitting. Pt could not stand long enough to get bp.

## 2012-06-12 NOTE — ED Notes (Signed)
IV infiltrated to right upper arm, IV d/c'd, catheter intact, attempted multiple times for another access without success, another nurse to assess for IV

## 2012-06-13 ENCOUNTER — Encounter (HOSPITAL_COMMUNITY): Payer: Medicare Other

## 2012-06-13 ENCOUNTER — Observation Stay (HOSPITAL_COMMUNITY): Payer: Medicare Other

## 2012-06-13 ENCOUNTER — Encounter (HOSPITAL_COMMUNITY): Payer: Self-pay | Admitting: *Deleted

## 2012-06-13 DIAGNOSIS — Z452 Encounter for adjustment and management of vascular access device: Secondary | ICD-10-CM | POA: Diagnosis not present

## 2012-06-13 DIAGNOSIS — R918 Other nonspecific abnormal finding of lung field: Secondary | ICD-10-CM | POA: Diagnosis not present

## 2012-06-13 DIAGNOSIS — S8990XA Unspecified injury of unspecified lower leg, initial encounter: Secondary | ICD-10-CM | POA: Diagnosis not present

## 2012-06-13 DIAGNOSIS — S79929A Unspecified injury of unspecified thigh, initial encounter: Secondary | ICD-10-CM | POA: Diagnosis not present

## 2012-06-13 DIAGNOSIS — G8220 Paraplegia, unspecified: Secondary | ICD-10-CM | POA: Diagnosis not present

## 2012-06-13 DIAGNOSIS — M25569 Pain in unspecified knee: Secondary | ICD-10-CM | POA: Diagnosis not present

## 2012-06-13 DIAGNOSIS — S99919A Unspecified injury of unspecified ankle, initial encounter: Secondary | ICD-10-CM | POA: Diagnosis not present

## 2012-06-13 DIAGNOSIS — M25559 Pain in unspecified hip: Secondary | ICD-10-CM | POA: Diagnosis not present

## 2012-06-13 DIAGNOSIS — S79919A Unspecified injury of unspecified hip, initial encounter: Secondary | ICD-10-CM | POA: Diagnosis not present

## 2012-06-13 LAB — IRON AND TIBC
Saturation Ratios: 6 % — ABNORMAL LOW (ref 20–55)
TIBC: 351 ug/dL (ref 250–470)
UIBC: 331 ug/dL (ref 125–400)

## 2012-06-13 LAB — RETICULOCYTES: Retic Ct Pct: 2.8 % (ref 0.4–3.1)

## 2012-06-13 LAB — BASIC METABOLIC PANEL
Chloride: 100 mEq/L (ref 96–112)
GFR calc Af Amer: 27 mL/min — ABNORMAL LOW (ref >90)
GFR calc non Af Amer: 24 mL/min — ABNORMAL LOW (ref >90)
Potassium: 3.1 mEq/L — ABNORMAL LOW (ref 3.5–5.1)
Sodium: 141 mEq/L (ref 135–145)

## 2012-06-13 LAB — GLUCOSE, CAPILLARY

## 2012-06-13 LAB — CBC
Hemoglobin: 8.9 g/dL — ABNORMAL LOW (ref 12.0–15.0)
MCHC: 30.3 g/dL (ref 30.0–36.0)
RDW: 16.4 % — ABNORMAL HIGH (ref 11.5–15.5)
WBC: 11 10*3/uL — ABNORMAL HIGH (ref 4.0–10.5)

## 2012-06-13 MED ORDER — RIVAROXABAN 10 MG PO TABS
20.0000 mg | ORAL_TABLET | Freq: Every day | ORAL | Status: DC
  Administered 2012-06-13 – 2012-06-14 (×2): 20 mg via ORAL
  Filled 2012-06-13 (×2): qty 2

## 2012-06-13 MED ORDER — INSULIN ASPART 100 UNIT/ML ~~LOC~~ SOLN
0.0000 [IU] | Freq: Every day | SUBCUTANEOUS | Status: DC
  Administered 2012-06-15: 2 [IU] via SUBCUTANEOUS

## 2012-06-13 MED ORDER — INSULIN ASPART 100 UNIT/ML ~~LOC~~ SOLN
0.0000 [IU] | Freq: Three times a day (TID) | SUBCUTANEOUS | Status: DC
  Administered 2012-06-13: 8 [IU] via SUBCUTANEOUS
  Administered 2012-06-13 – 2012-06-14 (×2): 5 [IU] via SUBCUTANEOUS
  Administered 2012-06-14: 3 [IU] via SUBCUTANEOUS
  Administered 2012-06-14: 5 [IU] via SUBCUTANEOUS
  Administered 2012-06-15: 3 [IU] via SUBCUTANEOUS
  Administered 2012-06-15 (×2): 5 [IU] via SUBCUTANEOUS
  Administered 2012-06-16: 3 [IU] via SUBCUTANEOUS

## 2012-06-13 MED ORDER — SODIUM CHLORIDE 0.9 % IJ SOLN
10.0000 mL | INTRAMUSCULAR | Status: DC | PRN
  Administered 2012-06-13 – 2012-06-16 (×4): 10 mL

## 2012-06-13 MED ORDER — INSULIN ASPART 100 UNIT/ML ~~LOC~~ SOLN: 0.0000 [IU] | Freq: Three times a day (TID) | SUBCUTANEOUS | Status: DC

## 2012-06-13 MED ORDER — ACETAMINOPHEN 500 MG PO TABS
500.0000 mg | ORAL_TABLET | Freq: Four times a day (QID) | ORAL | Status: DC | PRN
  Administered 2012-06-13 – 2012-06-14 (×3): 500 mg via ORAL
  Filled 2012-06-13 (×4): qty 1

## 2012-06-13 MED ORDER — POTASSIUM CHLORIDE CRYS ER 20 MEQ PO TBCR
40.0000 meq | EXTENDED_RELEASE_TABLET | Freq: Once | ORAL | Status: AC
  Administered 2012-06-13: 40 meq via ORAL
  Filled 2012-06-13: qty 2

## 2012-06-13 MED ORDER — SODIUM CHLORIDE 0.9 % IJ SOLN
10.0000 mL | Freq: Two times a day (BID) | INTRAMUSCULAR | Status: DC
  Administered 2012-06-13: 10 mL

## 2012-06-13 MED ORDER — TOBRAMYCIN 0.3 % OP SOLN
1.0000 [drp] | Freq: Three times a day (TID) | OPHTHALMIC | Status: DC
  Administered 2012-06-14 – 2012-06-16 (×4): 1 [drp] via OPHTHALMIC
  Filled 2012-06-13 (×2): qty 5

## 2012-06-13 NOTE — Progress Notes (Signed)
IV access was lost at 0330. Dr. Felecia Shelling was notified. Patient is eating and taking in fluids. Dr. Felecia Shelling gave orders to notify Dr. Juanetta Gosling in AM.

## 2012-06-13 NOTE — Progress Notes (Signed)
Subjective: She was admitted with severe weakness. Her legs buckled under her. She is complaining that her left leg is numb. She is unable to lift her left leg. She has some back pain as well. She has leg pain. She has lost IV access  Objective: Vital signs in last 24 hours: Temp:  [97.4 F (36.3 C)-97.6 F (36.4 C)] 97.6 F (36.4 C) (01/18 0500) Pulse Rate:  [51-63] 51  (01/18 0500) Resp:  [18-20] 20  (01/18 0500) BP: (83-116)/(45-97) 107/54 mmHg (01/18 0500) SpO2:  [93 %-100 %] 93 % (01/18 0737) Weight:  [136.079 kg (300 lb)-136.7 kg (301 lb 5.9 oz)] 136.7 kg (301 lb 5.9 oz) (01/18 0500) Weight change:  Last BM Date: 06/12/12  Intake/Output from previous day: 01/17 0701 - 01/18 0700 In: -  Out: 220 [Urine:220]  PHYSICAL EXAM General appearance: alert, cooperative and morbidly obese Resp: rhonchi bilaterally Cardio: regular rate and rhythm, S1, S2 normal, no murmur, click, rub or gallop GI: soft, non-tender; bowel sounds normal; no masses,  no organomegaly Extremities: She has multiple bruised areas on her legs but she is on chronic anti-coagulation. Her legs are nontender but she says it feels like someone is burning her when her legs are touched.  Lab Results:    Basic Metabolic Panel:  Basename 06/13/12 0631 06/12/12 1456  NA 141 140  K 3.1* 3.2*  CL 100 96  CO2 29 31  GLUCOSE 118* 95  BUN 47* 46*  CREATININE 2.09* 2.09*  CALCIUM 8.8 9.4  MG -- --  PHOS -- --   Liver Function Tests: No results found for this basename: AST:2,ALT:2,ALKPHOS:2,BILITOT:2,PROT:2,ALBUMIN:2 in the last 72 hours No results found for this basename: LIPASE:2,AMYLASE:2 in the last 72 hours No results found for this basename: AMMONIA:2 in the last 72 hours CBC:  Basename 06/13/12 0631 06/12/12 1456  WBC 11.0* 13.3*  NEUTROABS -- 11.3*  HGB 8.9* 10.6*  HCT 29.4* 34.2*  MCV 84.5 84.7  PLT 159 165   Cardiac Enzymes:  Basename 06/12/12 1456  CKTOTAL --  CKMB --  CKMBINDEX --    TROPONINI <0.30   BNP: No results found for this basename: PROBNP:3 in the last 72 hours D-Dimer: No results found for this basename: DDIMER:2 in the last 72 hours CBG:  Basename 06/13/12 0726 06/12/12 2144 06/12/12 1326  GLUCAP 116* 239* 95   Hemoglobin A1C: No results found for this basename: HGBA1C in the last 72 hours Fasting Lipid Panel: No results found for this basename: CHOL,HDL,LDLCALC,TRIG,CHOLHDL,LDLDIRECT in the last 72 hours Thyroid Function Tests: No results found for this basename: TSH,T4TOTAL,FREET4,T3FREE,THYROIDAB in the last 72 hours Anemia Panel: No results found for this basename: VITAMINB12,FOLATE,FERRITIN,TIBC,IRON,RETICCTPCT in the last 72 hours Coagulation: No results found for this basename: LABPROT:2,INR:2 in the last 72 hours Urine Drug Screen: Drugs of Abuse  No results found for this basename: labopia, cocainscrnur, labbenz, amphetmu, thcu, labbarb    Alcohol Level: No results found for this basename: ETH:2 in the last 72 hours Urinalysis:  Basename 06/12/12 2230  COLORURINE YELLOW  LABSPEC 1.015  PHURINE 6.0  GLUCOSEU NEGATIVE  HGBUR TRACE*  BILIRUBINUR NEGATIVE  KETONESUR NEGATIVE  PROTEINUR TRACE*  UROBILINOGEN 0.2  NITRITE NEGATIVE  LEUKOCYTESUR NEGATIVE   Misc. Labs:  ABGS No results found for this basename: PHART,PCO2,PO2ART,TCO2,HCO3 in the last 72 hours CULTURES No results found for this or any previous visit (from the past 240 hour(s)). Studies/Results: Dg Lumbar Spine Complete  06/12/2012  *RADIOLOGY REPORT*  Clinical Data: Fall.  The patient  had barium enema performed earlier today.  LUMBAR SPINE - COMPLETE 4+ VIEW  Comparison: Lumbar spine radiographs 08/22/2010  Findings: There is barium in the colon from a barium enema performed earlier today.  Multiple diverticula are filled with barium in the sigmoid colon.  The colon is nondistended.  Lumbar spine vertebral bodies are normal in height and alignment. Disc space  narrowing at L4-5 and L5-S1 is stable.  The remainder of the disc heights are preserved.  There is some facet joint degenerative change on the left at L4-L5 and L5-S1.  Negative for fracture.  Portions of some vertebral bodies are obscured by the barium on the frontal and oblique views.  IMPRESSION:  1.  No acute bony abnormality. 2.  Stable lower lumbar spine degenerative disc disease and facet joint arthropathy.   Original Report Authenticated By: Britta Mccreedy, M.D.    Ct Cervical Spine Wo Contrast  06/12/2012  *RADIOLOGY REPORT*  Clinical Data: Fall today  CT CERVICAL SPINE WITHOUT CONTRAST  Technique:  Multidetector CT imaging of the cervical spine was performed. Multiplanar CT image reconstructions were also generated.  Comparison: None.  Findings: Image detail is limited by patient body habitus. Cervical spine is aligned from the skull base through the superior endplate of T2.  There are postoperative changes of anterior cervical discectomy and fusion spanning C5-C7.  No evidence of hardware loosening.  Mild to moderate disc height loss at C3-4 and C4-5.  No acute fracture is identified. Mild left neural foraminal narrowing at C4-C5.  Mild bilateral neural foraminal narrowing at C5-C6 and C6-C7.  Mild left neural foraminal narrowing at C7-T1.  The prevertebral soft tissue contours appear within normal limits.  IMPRESSION: 1.  No evidence of acute bony injury to the cervical spine. 2.  Anterior cervical discectomy and fusion spanning C5 this and seven. 3.  Degenerative changes as described above.   Original Report Authenticated By: Britta Mccreedy, M.D.    Dg Colon W/cm - Wo/w Kub  06/12/2012  *RADIOLOGY REPORT*  Clinical Data: Heme positive stools  BARIUM ENEMA:  Technique: After insertion of an enema tip, barium was instilled retrograde into the colon under gravity drip.  Multiple images of the colon were obtained. Exam is aeration is limited by body habitus and limited patient mobility.  Fluoroscopy time:  4.4 minutes  Comparison: None  Findings: Normal bowel gas pattern on scout image. Normal retrograde passage of contrast from the rectum to the cecal tip. Appendix surgically absent. Competent ileocecal valve. Diverticulosis of sigmoid colon. Patient was unable to tolerate any significant degree colonic distention and incontinent of contrast and expelled the enema tip/balloon once during the exam. No gross narrowing or stricture identified. Tiny filling defect identified at the left lateral aspect of the rectum on a single image question tiny polyp versus artifact. No definite or additional persistent intraluminal filling defects. Smooth appearance of colonic mucosa on single contrast exam.  IMPRESSION: Sigmoid diverticulosis. Questionable tiny left lateral rectal polyp.   Original Report Authenticated By: Ulyses Southward, M.D.     Medications:  Prior to Admission:  Prescriptions prior to admission  Medication Sig Dispense Refill  . albuterol (PROVENTIL HFA;VENTOLIN HFA) 108 (90 BASE) MCG/ACT inhaler Inhale 2 puffs into the lungs 2 (two) times daily. For shortness of breath      . ALPRAZolam (XANAX) 0.5 MG tablet Take 0.5 mg by mouth 3 (three) times daily as needed. For anxiety      . aspirin EC 81 MG tablet Take 81 mg by  mouth daily.      Marland Kitchen dexamethasone (DECADRON) 4 MG tablet Take 4 mg by mouth daily with breakfast.      . esomeprazole (NEXIUM) 40 MG capsule Take 40 mg by mouth 2 (two) times daily.       . fluticasone (FLONASE) 50 MCG/ACT nasal spray Place 1 spray into the nose daily.        . Fluticasone-Salmeterol (ADVAIR) 250-50 MCG/DOSE AEPB Inhale 1 puff into the lungs every 12 (twelve) hours.        . gabapentin (NEURONTIN) 100 MG capsule Take 100 mg by mouth 3 (three) times daily.      . insulin aspart (NOVOLOG) 100 UNIT/ML injection Inject 10-15 Units into the skin 3 (three) times daily before meals. According to Sliding Scale      . Insulin Detemir (LEVEMIR FLEXPEN [place]) Inject 30 Units into the  skin at bedtime.       Marland Kitchen levothyroxine (SYNTHROID, LEVOTHROID) 200 MCG tablet Take 200 mcg by mouth daily.      Marland Kitchen linagliptin (TRADJENTA) 5 MG TABS tablet Take 5 mg by mouth every morning.       . metoprolol succinate (TOPROL-XL) 50 MG 24 hr tablet Take 1 tablet (50 mg total) by mouth daily.  90 tablet  3  . nitroGLYCERIN (NITROSTAT) 0.4 MG SL tablet Place 1 tablet (0.4 mg total) under the tongue every 5 (five) minutes as needed for chest pain.  25 tablet  3  . predniSONE (DELTASONE) 10 MG tablet Take 5 mg by mouth daily.       Marland Kitchen RIVAROXABAN PO Take by mouth daily.      . rosuvastatin (CRESTOR) 40 MG tablet Take 1 tablet (40 mg total) by mouth at bedtime.  90 tablet  3  . sertraline (ZOLOFT) 50 MG tablet Take 50 mg by mouth daily.      Marland Kitchen tolterodine (DETROL LA) 4 MG 24 hr capsule Take 4 mg by mouth every morning.       . torsemide (DEMADEX) 100 MG tablet Take 100 mg by mouth 2 (two) times daily.        Scheduled:   . albuterol  2 puff Inhalation BID  . aspirin EC  81 mg Oral Daily  . atorvastatin  80 mg Oral QHS  . fesoterodine  4 mg Oral Daily  . fluticasone  1 spray Each Nare Daily  . gabapentin  100 mg Oral TID  . insulin aspart  10-15 Units Subcutaneous TID AC  . insulin detemir  30 Units Subcutaneous QHS  . levothyroxine  200 mcg Oral QAC breakfast  . linagliptin  5 mg Oral Daily  . metoprolol succinate  50 mg Oral Daily  . mometasone-formoterol  2 puff Inhalation BID  . pantoprazole  80 mg Oral Daily  . predniSONE  5 mg Oral Daily  . sertraline  50 mg Oral Daily   Continuous:   . sodium chloride 75 mL/hr at 06/12/12 1843   OZD:[id number], nitroGLYCERIN  Assesment: She has multiple medical problems including COPD morbid obesity adrenal insufficiency on chronic steroid replacement chronic anticoagulations for DVT diabetes hyperlipidemia and now has leg weakness. Active Problems:  * No active hospital problems. *     Plan: Because she has lost IV access I have asked Dr.  Leticia Penna to put in a central line. She needs further testing of her legs and may need MRI of the back because I'm concerned that she may have a ruptured disc or some other problem  affecting the nerve on the left leg    LOS: 1 day   Frankie Zito L 06/13/2012, 10:06 AM

## 2012-06-14 ENCOUNTER — Encounter (HOSPITAL_COMMUNITY): Payer: Self-pay | Admitting: *Deleted

## 2012-06-14 ENCOUNTER — Inpatient Hospital Stay (HOSPITAL_COMMUNITY): Payer: Medicare Other

## 2012-06-14 DIAGNOSIS — IMO0002 Reserved for concepts with insufficient information to code with codable children: Secondary | ICD-10-CM | POA: Diagnosis not present

## 2012-06-14 DIAGNOSIS — I82409 Acute embolism and thrombosis of unspecified deep veins of unspecified lower extremity: Secondary | ICD-10-CM | POA: Diagnosis present

## 2012-06-14 DIAGNOSIS — I5032 Chronic diastolic (congestive) heart failure: Secondary | ICD-10-CM

## 2012-06-14 DIAGNOSIS — Z7901 Long term (current) use of anticoagulants: Secondary | ICD-10-CM | POA: Diagnosis not present

## 2012-06-14 DIAGNOSIS — I2510 Atherosclerotic heart disease of native coronary artery without angina pectoris: Secondary | ICD-10-CM | POA: Diagnosis not present

## 2012-06-14 DIAGNOSIS — Z01810 Encounter for preprocedural cardiovascular examination: Secondary | ICD-10-CM | POA: Diagnosis not present

## 2012-06-14 DIAGNOSIS — N289 Disorder of kidney and ureter, unspecified: Secondary | ICD-10-CM | POA: Diagnosis not present

## 2012-06-14 DIAGNOSIS — M79609 Pain in unspecified limb: Secondary | ICD-10-CM | POA: Diagnosis not present

## 2012-06-14 DIAGNOSIS — J449 Chronic obstructive pulmonary disease, unspecified: Secondary | ICD-10-CM | POA: Diagnosis not present

## 2012-06-14 DIAGNOSIS — I10 Essential (primary) hypertension: Secondary | ICD-10-CM | POA: Diagnosis not present

## 2012-06-14 DIAGNOSIS — M25569 Pain in unspecified knee: Secondary | ICD-10-CM | POA: Diagnosis not present

## 2012-06-14 DIAGNOSIS — R29898 Other symptoms and signs involving the musculoskeletal system: Secondary | ICD-10-CM

## 2012-06-14 DIAGNOSIS — M5137 Other intervertebral disc degeneration, lumbosacral region: Secondary | ICD-10-CM | POA: Diagnosis not present

## 2012-06-14 DIAGNOSIS — M25559 Pain in unspecified hip: Secondary | ICD-10-CM | POA: Diagnosis not present

## 2012-06-14 DIAGNOSIS — K5730 Diverticulosis of large intestine without perforation or abscess without bleeding: Secondary | ICD-10-CM | POA: Diagnosis not present

## 2012-06-14 DIAGNOSIS — S199XXA Unspecified injury of neck, initial encounter: Secondary | ICD-10-CM | POA: Diagnosis not present

## 2012-06-14 DIAGNOSIS — E118 Type 2 diabetes mellitus with unspecified complications: Secondary | ICD-10-CM

## 2012-06-14 DIAGNOSIS — I517 Cardiomegaly: Secondary | ICD-10-CM | POA: Diagnosis not present

## 2012-06-14 DIAGNOSIS — Z794 Long term (current) use of insulin: Secondary | ICD-10-CM | POA: Diagnosis not present

## 2012-06-14 DIAGNOSIS — T148XXA Other injury of unspecified body region, initial encounter: Secondary | ICD-10-CM | POA: Diagnosis present

## 2012-06-14 DIAGNOSIS — Z01818 Encounter for other preprocedural examination: Secondary | ICD-10-CM | POA: Diagnosis not present

## 2012-06-14 DIAGNOSIS — Z01812 Encounter for preprocedural laboratory examination: Secondary | ICD-10-CM | POA: Diagnosis not present

## 2012-06-14 DIAGNOSIS — N179 Acute kidney failure, unspecified: Secondary | ICD-10-CM | POA: Diagnosis not present

## 2012-06-14 HISTORY — DX: Other symptoms and signs involving the musculoskeletal system: R29.898

## 2012-06-14 LAB — BASIC METABOLIC PANEL
GFR calc Af Amer: 29 mL/min — ABNORMAL LOW (ref >90)
GFR calc non Af Amer: 25 mL/min — ABNORMAL LOW (ref >90)
Potassium: 3.1 mEq/L — ABNORMAL LOW (ref 3.5–5.1)
Sodium: 135 mEq/L (ref 135–145)

## 2012-06-14 LAB — CBC
HCT: 28.4 % — ABNORMAL LOW (ref 36.0–46.0)
Hemoglobin: 8.6 g/dL — ABNORMAL LOW (ref 12.0–15.0)
MCV: 85.5 fL (ref 78.0–100.0)
WBC: 8.8 10*3/uL (ref 4.0–10.5)

## 2012-06-14 LAB — URINE CULTURE: Colony Count: 45000

## 2012-06-14 LAB — GLUCOSE, CAPILLARY
Glucose-Capillary: 156 mg/dL — ABNORMAL HIGH (ref 70–99)
Glucose-Capillary: 234 mg/dL — ABNORMAL HIGH (ref 70–99)
Glucose-Capillary: 195 mg/dL — ABNORMAL HIGH (ref 70–99)

## 2012-06-14 LAB — FERRITIN: Ferritin: 42 ng/mL (ref 10–291)

## 2012-06-14 MED ORDER — SODIUM CHLORIDE 0.9 % IV SOLN
INTRAVENOUS | Status: DC
  Administered 2012-06-14: 15:00:00 via INTRAVENOUS

## 2012-06-14 MED ORDER — POTASSIUM CHLORIDE CRYS ER 20 MEQ PO TBCR
40.0000 meq | EXTENDED_RELEASE_TABLET | Freq: Once | ORAL | Status: AC
  Administered 2012-06-14: 40 meq via ORAL
  Filled 2012-06-14: qty 2

## 2012-06-14 MED ORDER — HYDROCODONE-ACETAMINOPHEN 5-325 MG PO TABS
1.0000 | ORAL_TABLET | ORAL | Status: DC | PRN
  Administered 2012-06-14 (×2): 1 via ORAL
  Filled 2012-06-14: qty 1
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
## 2012-06-14 NOTE — Progress Notes (Signed)
Report called to Vernona Rieger RN on 6N.  Pt stable.  [facility] here at this time to transport pt.

## 2012-06-14 NOTE — Progress Notes (Addendum)
Subjective: She came to the hospital to have a barium enema. This was done as she was leaving the hospital her legs buckled under her. She says they feel somewhat painful. She is very weak in both lower extremities and she says she feels like she cannot feel her legs. Lumbar spine x-ray was done yesterday and does not show a compression fracture. An MRI has been requested but we don't have that capability on the weekend.  Objective: Vital signs in last 24 hours: Temp:  [97.4 F (36.3 C)-97.7 F (36.5 C)] 97.4 F (36.3 C) (01/19 0524) Pulse Rate:  [87-116] 116  (01/19 0524) Resp:  [20] 20  (01/19 0524) BP: (87-136)/(49-96) 136/96 mmHg (01/19 0524) SpO2:  [92 %-95 %] 92 % (01/19 0810) Weight change:  Last BM Date: 06/13/12  Intake/Output from previous day: 01/18 0701 - 01/19 0700 In: 920 [P.O.:720; I.V.:200] Out: -   PHYSICAL EXAM General appearance: alert, cooperative and morbidly obese Resp: clear to auscultation bilaterally Cardio: regular rate and rhythm, S1, S2 normal, no murmur, click, rub or gallop GI: soft, non-tender; bowel sounds normal; no masses,  no organomegaly Extremities: Both lower extremities are very weak now only the left was weak yesterday  Lab Results:    Basic Metabolic Panel:  Basename 06/13/12 0631 06/12/12 1456  NA 141 140  K 3.1* 3.2*  CL 100 96  CO2 29 31  GLUCOSE 118* 95  BUN 47* 46*  CREATININE 2.09* 2.09*  CALCIUM 8.8 9.4  MG -- --  PHOS -- --   Liver Function Tests: No results found for this basename: AST:2,ALT:2,ALKPHOS:2,BILITOT:2,PROT:2,ALBUMIN:2 in the last 72 hours No results found for this basename: LIPASE:2,AMYLASE:2 in the last 72 hours No results found for this basename: AMMONIA:2 in the last 72 hours CBC:  Basename 06/14/12 0610 06/13/12 0631 06/12/12 1456  WBC 8.8 11.0* --  NEUTROABS -- -- 11.3*  HGB 8.6* 8.9* --  HCT 28.4* 29.4* --  MCV 85.5 84.5 --  PLT 144* 159 --   Cardiac Enzymes:  Basename 06/12/12 1456    CKTOTAL --  CKMB --  CKMBINDEX --  TROPONINI <0.30   BNP: No results found for this basename: PROBNP:3 in the last 72 hours D-Dimer: No results found for this basename: DDIMER:2 in the last 72 hours CBG:  Basename 06/14/12 0718 06/13/12 2119 06/13/12 1635 06/13/12 1142 06/13/12 0726 06/12/12 2144  GLUCAP 156* 190* 272* 213* 116* 239*   Hemoglobin A1C: No results found for this basename: HGBA1C in the last 72 hours Fasting Lipid Panel: No results found for this basename: CHOL,HDL,LDLCALC,TRIG,CHOLHDL,LDLDIRECT in the last 72 hours Thyroid Function Tests: No results found for this basename: TSH,T4TOTAL,FREET4,T3FREE,THYROIDAB in the last 72 hours Anemia Panel:  Basename 06/13/12 1426  VITAMINB12 206*  FOLATE 6.5  FERRITIN 42  TIBC 351  IRON 20*  RETICCTPCT 2.8   Coagulation: No results found for this basename: LABPROT:2,INR:2 in the last 72 hours Urine Drug Screen: Drugs of Abuse  No results found for this basename: labopia, cocainscrnur, labbenz, amphetmu, thcu, labbarb    Alcohol Level: No results found for this basename: ETH:2 in the last 72 hours Urinalysis:  Basename 06/12/12 2230  COLORURINE YELLOW  LABSPEC 1.015  PHURINE 6.0  GLUCOSEU NEGATIVE  HGBUR TRACE*  BILIRUBINUR NEGATIVE  KETONESUR NEGATIVE  PROTEINUR TRACE*  UROBILINOGEN 0.2  NITRITE NEGATIVE  LEUKOCYTESUR NEGATIVE   Misc. Labs:  ABGS No results found for this basename: PHART,PCO2,PO2ART,TCO2,HCO3 in the last 72 hours CULTURES No results found for this or  any previous visit (from the past 240 hour(s)). Studies/Results: Dg Lumbar Spine Complete  06/12/2012  *RADIOLOGY REPORT*  Clinical Data: Fall.  The patient had barium enema performed earlier today.  LUMBAR SPINE - COMPLETE 4+ VIEW  Comparison: Lumbar spine radiographs 08/22/2010  Findings: There is barium in the colon from a barium enema performed earlier today.  Multiple diverticula are filled with barium in the sigmoid colon.  The colon  is nondistended.  Lumbar spine vertebral bodies are normal in height and alignment. Disc space narrowing at L4-5 and L5-S1 is stable.  The remainder of the disc heights are preserved.  There is some facet joint degenerative change on the left at L4-L5 and L5-S1.  Negative for fracture.  Portions of some vertebral bodies are obscured by the barium on the frontal and oblique views.  IMPRESSION:  1.  No acute bony abnormality. 2.  Stable lower lumbar spine degenerative disc disease and facet joint arthropathy.   Original Report Authenticated By: Britta Mccreedy, M.D.    Dg Hip Complete Left  06/13/2012  *RADIOLOGY REPORT*  Clinical Data: Left hip pain and leg pain secondary to a fall.  LEFT HIP - COMPLETE 2+ VIEW  Comparison: None.  Findings: No fracture, dislocation, or significant degenerative change of the left hip.  IMPRESSION: Normal exam.   Original Report Authenticated By: Francene Boyers, M.D.    Ct Cervical Spine Wo Contrast  06/12/2012  *RADIOLOGY REPORT*  Clinical Data: Fall today  CT CERVICAL SPINE WITHOUT CONTRAST  Technique:  Multidetector CT imaging of the cervical spine was performed. Multiplanar CT image reconstructions were also generated.  Comparison: None.  Findings: Image detail is limited by patient body habitus. Cervical spine is aligned from the skull base through the superior endplate of T2.  There are postoperative changes of anterior cervical discectomy and fusion spanning C5-C7.  No evidence of hardware loosening.  Mild to moderate disc height loss at C3-4 and C4-5.  No acute fracture is identified. Mild left neural foraminal narrowing at C4-C5.  Mild bilateral neural foraminal narrowing at C5-C6 and C6-C7.  Mild left neural foraminal narrowing at C7-T1.  The prevertebral soft tissue contours appear within normal limits.  IMPRESSION: 1.  No evidence of acute bony injury to the cervical spine. 2.  Anterior cervical discectomy and fusion spanning C5 this and seven. 3.  Degenerative changes as  described above.   Original Report Authenticated By: Britta Mccreedy, M.D.    Dg Chest Port 1 View  06/13/2012  *RADIOLOGY REPORT*  Clinical Data: Post PICC line insertion  PORTABLE CHEST - 1 VIEW  Comparison: 09/11/2011  Findings: Grossly unchanged enlarged cardiac silhouette mediastinal contours. Left upper extremity approach PICC line terminates over the distal SVC.  Grossly unchanged bibasilar heterogeneous opacities, left greater than right.  No new focal airspace opacities.  No definite pleural effusion or pneumothorax. Unchanged bones of the lower cervical ACDF, incompletely evaluated.  IMPRESSION: 1.  Left upper extremity approach PICC line tip terminates over the distal SVC. 2.  Unchanged cardiomegaly.  3.  Persistently reduced lung volumes with grossly unchanged bibasilar opacities, left greater than right, atelectasis versus infiltrate.   Original Report Authenticated By: Tacey Ruiz, MD    Dg Knee Complete 4 Views Left  06/13/2012  *RADIOLOGY REPORT*  Clinical Data: Left knee pain secondary to a fall.  LEFT KNEE - COMPLETE 4+ VIEW  Comparison: 07/25/2006  Findings: There is no fracture or dislocation.  No joint effusion. Progression of fairly severe osteoarthritis of the  lateral compartment and to a lesser degree in the medial compartment.  IMPRESSION: No acute abnormality.  Progressive osteoarthritis.   Original Report Authenticated By: Francene Boyers, M.D.    Dg Colon W/cm - Wo/w Kub  06/12/2012  *RADIOLOGY REPORT*  Clinical Data: Heme positive stools  BARIUM ENEMA:  Technique: After insertion of an enema tip, barium was instilled retrograde into the colon under gravity drip.  Multiple images of the colon were obtained. Exam is aeration is limited by body habitus and limited patient mobility.  Fluoroscopy time: 4.4 minutes  Comparison: None  Findings: Normal bowel gas pattern on scout image. Normal retrograde passage of contrast from the rectum to the cecal tip. Appendix surgically absent.  Competent ileocecal valve. Diverticulosis of sigmoid colon. Patient was unable to tolerate any significant degree colonic distention and incontinent of contrast and expelled the enema tip/balloon once during the exam. No gross narrowing or stricture identified. Tiny filling defect identified at the left lateral aspect of the rectum on a single image question tiny polyp versus artifact. No definite or additional persistent intraluminal filling defects. Smooth appearance of colonic mucosa on single contrast exam.  IMPRESSION: Sigmoid diverticulosis. Questionable tiny left lateral rectal polyp.   Original Report Authenticated By: Ulyses Southward, M.D.     Medications:  Prior to Admission:  Prescriptions prior to admission  Medication Sig Dispense Refill  . albuterol (PROVENTIL HFA;VENTOLIN HFA) 108 (90 BASE) MCG/ACT inhaler Inhale 2 puffs into the lungs 2 (two) times daily. For shortness of breath      . ALPRAZolam (XANAX) 0.5 MG tablet Take 0.5 mg by mouth 3 (three) times daily as needed. For anxiety      . aspirin EC 81 MG tablet Take 81 mg by mouth daily.      Marland Kitchen dexamethasone (DECADRON) 4 MG tablet Take 4 mg by mouth daily with breakfast.      . esomeprazole (NEXIUM) 40 MG capsule Take 40 mg by mouth 2 (two) times daily.       . fluticasone (FLONASE) 50 MCG/ACT nasal spray Place 1 spray into the nose daily.        . Fluticasone-Salmeterol (ADVAIR) 250-50 MCG/DOSE AEPB Inhale 1 puff into the lungs every 12 (twelve) hours.        . gabapentin (NEURONTIN) 100 MG capsule Take 100 mg by mouth 3 (three) times daily.      . insulin aspart (NOVOLOG) 100 UNIT/ML injection Inject 10-15 Units into the skin 3 (three) times daily before meals. According to Sliding Scale      . Insulin Detemir (LEVEMIR FLEXPEN Martin) Inject 30 Units into the skin at bedtime.       Marland Kitchen levothyroxine (SYNTHROID, LEVOTHROID) 200 MCG tablet Take 200 mcg by mouth daily.      Marland Kitchen linagliptin (TRADJENTA) 5 MG TABS tablet Take 5 mg by mouth every  morning.       . metoprolol succinate (TOPROL-XL) 50 MG 24 hr tablet Take 1 tablet (50 mg total) by mouth daily.  90 tablet  3  . nitroGLYCERIN (NITROSTAT) 0.4 MG SL tablet Place 1 tablet (0.4 mg total) under the tongue every 5 (five) minutes as needed for chest pain.  25 tablet  3  . predniSONE (DELTASONE) 10 MG tablet Take 5 mg by mouth daily.       . Rivaroxaban (XARELTO) 20 MG TABS Take 20 mg by mouth daily.      . rosuvastatin (CRESTOR) 40 MG tablet Take 1 tablet (40 mg total) by mouth  at bedtime.  90 tablet  3  . sertraline (ZOLOFT) 50 MG tablet Take 50 mg by mouth daily.      Marland Kitchen tolterodine (DETROL LA) 4 MG 24 hr capsule Take 4 mg by mouth every morning.       . torsemide (DEMADEX) 100 MG tablet Take 100 mg by mouth 2 (two) times daily.        Scheduled:   . albuterol  2 puff Inhalation BID  . aspirin EC  81 mg Oral Daily  . atorvastatin  80 mg Oral QHS  . fesoterodine  4 mg Oral Daily  . fluticasone  1 spray Each Nare Daily  . gabapentin  100 mg Oral TID  . insulin aspart  0-15 Units Subcutaneous TID WC  . insulin aspart  0-5 Units Subcutaneous QHS  . insulin detemir  30 Units Subcutaneous QHS  . levothyroxine  200 mcg Oral QAC breakfast  . linagliptin  5 mg Oral Daily  . metoprolol succinate  50 mg Oral Daily  . mometasone-formoterol  2 puff Inhalation BID  . pantoprazole  80 mg Oral Daily  . potassium chloride  40 mEq Oral Once  . predniSONE  5 mg Oral Daily  . rivaroxaban  20 mg Oral Daily  . sertraline  50 mg Oral Daily  . sodium chloride  10-40 mL Intracatheter Q12H  . tobramycin  1 drop Both Eyes TID   Continuous:   . sodium chloride 50 mL/hr at 06/14/12 0020   RUE:[id number], ALPRAZolam, nitroGLYCERIN, sodium chloride  Assesment: She has progressed from left leg weakness 2 bilateral weakness. I am concerned that she may have a problem in the spinal cord. She also has been on chronic steroid therapy for adrenal insufficiency so she could have some sort of a  steroid myopathy but that would not cause her sensory complaints. She was hypokalemic and that will be rechecked but that should not cause her sensory complaints either.she is on full anti-coagulation and could have some sort of a bleeding issue around her spinal cord also Active Problems:  * No active hospital problems. *     Plan: She may need to be transferred so that she can have her surgery evaluation and MRI.I discussed her problem with Dr. Yetta Barre who is on call  Neurosurgery and  With Dr. Lavera Guise the hospitalist. She will be transferred to [facility]. I stopped her Xarelto but I think she already had a dose today    LOS: 2 days   Ann [name] L 06/14/2012, 10:04 AM

## 2012-06-14 NOTE — Progress Notes (Signed)
Report given to [facility] RN, attempted to call report to 6N, nurse to call me back for report.  Pt stable and aware of transfer.

## 2012-06-14 NOTE — Progress Notes (Signed)
Pt's MRI of Lumbar spine w/o cm  is completed. Pt was only able to fit in to scanner feet 1st with her arms over her head ; spoke with Neurologist and explained this make pt's thoracic spine and cervical spine not an option. I was to get scan up into pt's lower thoracic spine. If further imaging is required; this pt is a candidate for an open MRI.

## 2012-06-15 ENCOUNTER — Inpatient Hospital Stay (HOSPITAL_COMMUNITY): Payer: Medicare Other

## 2012-06-15 DIAGNOSIS — E2749 Other adrenocortical insufficiency: Secondary | ICD-10-CM

## 2012-06-15 DIAGNOSIS — I82409 Acute embolism and thrombosis of unspecified deep veins of unspecified lower extremity: Secondary | ICD-10-CM | POA: Diagnosis not present

## 2012-06-15 DIAGNOSIS — N289 Disorder of kidney and ureter, unspecified: Secondary | ICD-10-CM | POA: Diagnosis not present

## 2012-06-15 DIAGNOSIS — R29898 Other symptoms and signs involving the musculoskeletal system: Secondary | ICD-10-CM | POA: Diagnosis not present

## 2012-06-15 DIAGNOSIS — E118 Type 2 diabetes mellitus with unspecified complications: Secondary | ICD-10-CM | POA: Diagnosis not present

## 2012-06-15 LAB — BASIC METABOLIC PANEL
BUN: 34 mg/dL — ABNORMAL HIGH (ref 6–23)
GFR calc Af Amer: 45 mL/min — ABNORMAL LOW (ref >90)
GFR calc non Af Amer: 39 mL/min — ABNORMAL LOW (ref >90)
Potassium: 3.4 mEq/L — ABNORMAL LOW (ref 3.5–5.1)
Sodium: 139 mEq/L (ref 135–145)

## 2012-06-15 LAB — CBC
MCHC: 30.2 g/dL (ref 30.0–36.0)
Platelets: 159 10*3/uL (ref 150–400)
RDW: 16.7 % — ABNORMAL HIGH (ref 11.5–15.5)

## 2012-06-15 LAB — GLUCOSE, CAPILLARY
Glucose-Capillary: 230 mg/dL — ABNORMAL HIGH (ref 70–99)
Glucose-Capillary: 230 mg/dL — ABNORMAL HIGH (ref 70–99)

## 2012-06-15 MED ORDER — FUROSEMIDE 20 MG PO TABS: 40.0000 mg | ORAL_TABLET | Freq: Two times a day (BID) | ORAL | Status: DC

## 2012-06-15 MED ORDER — HYDROCODONE-ACETAMINOPHEN 5-325 MG PO TABS: 1.0000 | ORAL_TABLET | ORAL | Status: DC | PRN

## 2012-06-15 MED ORDER — RIVAROXABAN 20 MG PO TABS
20.0000 mg | ORAL_TABLET | Freq: Every day | ORAL | Status: DC
  Administered 2012-06-15: 20 mg via ORAL
  Filled 2012-06-15 (×2): qty 1
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
## 2012-06-15 NOTE — Evaluation (Signed)
Physical Therapy Evaluation Patient Details Name: Ann [name] MRN: [id number] DOB: [birth year] Today's Date: 06/15/2012 Time: 4098-1191 PT Time Calculation (min): 40 min  PT Assessment / Plan / Recommendation Clinical Impression  Patient is a 67 y.o. female who c/o weakness in bilateral lower extremities.  Patient demonstrates deficits in functional mobility at this time secondary poor activity tolerance, deconditioning, body habitus, and sensory deficits.  Patient strength in isolated testing appears within functional limits however there are significant sensory deficits in both lwer extremities.  During therapy session patient demonstrates decreased activity tolerance as evidenced by O2 desaturation to 87% with elevated HR 130s.  Patient reported dizziness with acitivty.  Patient will need continued skilled PT to improve activity tolerance and functional mobility. Patient states that she has a lot of support and assistance at home, however patient may need continued rehabilitation for deconditioning and weakness prior to d/c as patient states that she normally can ambulate approximately 1/4 mile of total ambulation distance in a given day.  Will continue to progress activity as tolerated.    PT Assessment  Patient needs continued PT services    Follow Up Recommendations  SNF;Home health PT;Supervision/Assistance - 24 hour (SNF vs HHPT pending progress)    Does the patient have the potential to tolerate intense rehabilitation      Barriers to Discharge        Equipment Recommendations  None recommended by PT    Recommendations for Other Services     Frequency Min 3X/week    Precautions / Restrictions     Pertinent Vitals/Pain Vitals HR 130's; SpO2 87% with activity seated EOB, + dizziness; resolved with rest supine      Mobility  Bed Mobility Bed Mobility: Sitting - Scoot to Edge of Bed;Supine to Sit;Sit to Supine;Scooting to HOB Supine to Sit: 2: Max assist;HOB  elevated Sitting - Scoot to Edge of Bed: 3: Mod assist Sit to Supine: 2: Max assist Scooting to HOB: 1: +2 Total assist Scooting to [facility]: Patient Percentage: 20% Details for Bed Mobility Assistance: Increased work of breathing to sit, HR 136 O2 89% Transfers Transfers: Not assessed Ambulation/Gait Ambulation/Gait Assistance: Not tested (comment)           PT Diagnosis: Difficulty walking;Generalized weakness;Abnormality of gait  PT Problem List: Decreased strength;Decreased activity tolerance;Decreased range of motion;Obesity;Impaired sensation;Cardiopulmonary status limiting activity;Decreased balance;Decreased mobility PT Treatment Interventions: DME instruction;Gait training;Functional mobility training;Therapeutic exercise;Therapeutic activities;Patient/family education   PT Goals Acute Rehab PT Goals PT Goal Formulation: With patient/family Time For Goal Achievement: 06/22/12 Potential to Achieve Goals: Fair Pt will go Supine/Side to Sit: with min assist PT Goal: Supine/Side to Sit - Progress: Goal set today Pt will go Sit to Stand: with min assist PT Goal: Sit to Stand - Progress: Goal set today Pt will go Stand to Sit: with min assist PT Goal: Stand to Sit - Progress: Goal set today Pt will Transfer Bed to Chair/Chair to Bed: with min assist PT Transfer Goal: Bed to Chair/Chair to Bed - Progress: Goal set today Pt will Ambulate: 16 - 50 feet;with supervision PT Goal: Ambulate - Progress: Goal set today  Visit Information  Last PT Received On: 06/15/12 Assistance Needed: +2    Subjective Data  Subjective: My back hurts a little Patient Stated Goal: to go home   Prior Functioning  Home Living Lives With: Spouse;Son (duaghter in law and 3 grand-children) Available Help at Discharge: Family Type of Home: House Home Access: Ramped entrance Home Layout: Two level;Able to  live on main level with bedroom/bathroom Bathroom Shower/Tub: Health visitor:  Standard Bathroom Accessibility: Yes How Accessible: Accessible via walker Home Adaptive Equipment: Grab bars around toilet;Grab bars in shower;Straight cane;Wheelchair - powered;Walker - rolling;Bedside commode/3-in-1;Built-in shower seat Prior Function Level of Independence: Independent with assistive device(s) (uses cane) Able to Take Stairs?: No Driving: No Vocation: On disability Communication Communication:  (wear glasses for reading) Dominant Hand: Right    Cognition       Extremity/Trunk Assessment Right Upper Extremity Assessment RUE ROM/Strength/Tone: Deficits;Due to pain (arthritis) RUE Coordination: WFL - gross/fine motor Left Upper Extremity Assessment LUE ROM/Strength/Tone: WFL for tasks assessed LUE Coordination: WFL - gross/fine motor Right Lower Extremity Assessment RLE ROM/Strength/Tone: Deficits RLE ROM/Strength/Tone Deficits: Left  weaker then right generalized however manual strength testing WFL RLE Sensation: Deficits RLE Sensation Deficits: numbness left > Right Left Lower Extremity Assessment LLE ROM/Strength/Tone: Deficits LLE ROM/Strength/Tone Deficits: weakness  (Left  weaker then right generalized however manual strength ) LLE Sensation: Deficits LLE Sensation Deficits: numbness left > Right Trunk Assessment Trunk Assessment: Other exceptions (limited assessment secondary to body habitus)   Balance Balance Balance Assessed: Yes Static Sitting Balance Static Sitting - Balance Support: Feet supported Static Sitting - Level of Assistance: 5: Stand by assistance Static Sitting - Comment/# of Minutes: 4 minutes, O2 87% Pursed lip breathing, no improvements after 4 mins; returned to bed  End of Session PT - End of Session Activity Tolerance: Patient limited by fatigue Patient left: in bed;with call bell/phone within reach Nurse Communication: Mobility status (Vitals elevated HR decreased SpO2 into 80's)  GP     Fabio Asa 06/15/2012, 11:33  AM Charlotte Crumb, PT DPT  [phone]

## 2012-06-15 NOTE — Progress Notes (Signed)
Inpatient Diabetes Program Recommendations  AACE/ADA: New Consensus Statement on Inpatient Glycemic Control (2013)  Target Ranges:  Prepandial:   less than 140 mg/dL      Peak postprandial:   less than 180 mg/dL (1-2 hours)      Critically ill patients:  140 - 180 mg/dL   Reason for Visit: Hyperglycemia  Results for Ann [name], Ann [name] (MRN [id number]) as of 06/15/2012 11:50  Ref. Range 06/14/2012 07:18 06/14/2012 11:26 06/14/2012 17:03 06/14/2012 22:45 06/15/2012 08:06  Glucose-Capillary Latest Range: 70-99 mg/dL 409 (H) 811 (H) 914 (H) 195 (H) 183 (H)     Inpatient Diabetes Program Recommendations Insulin - Meal Coverage: Add meal coverage insulin - Novolog 4 units tidwc  Note: AM blood sugars look good.  Needs meal coverage insulin - On Novolog 10 - 15 units tidwc at home.  Will continue to follow.  Thank you. Ailene Ards, RD, LDN, CDE Inpatient Diabetes Coordinator [phone]  Thank you.

## 2012-06-15 NOTE — Progress Notes (Signed)
[facility] PROGRESS NOTE Assessment/Plan: Lower extremity weakness  -Etiology unclear. Patient does not have any bowel or bladder incontinence. Patient still has intact sensation in her lower extremity.  - MRI 1.20.2014Unremarkable lumbar spine MRI examination. - CT cervical and thoracic 1.19.2014 no acute abnormaility. - probable peripheral neuropathy. - resume Rivaroxiban.  History of DVT  - resume Rivaroxaban.  History of chronic diastolic congestive heart failure with chronic lower extremity edema: - Patient had a 2-D echocardiogram on 01/12/2010 which showed EF of 65-70%. Saline lock IV fluids. Not being diuresis due to acute renal failure.   Acute renal failure: - Baseline Cr 0.8-1.0 - Patient's torsemide has been held.  - renal US: Mild renal cortical thinning   Adrenal insufficiency  Continue prednisone 5 mg daily.   Code Status: DNR/DNI Family Communication:  Disposition Plan: TBD   Consultants:  Neurology  Procedures:  MRI  CT cervical and lumbar  Antibiotics:  none  HPI/Subjective: Back pain better  Objective: Filed Vitals:   06/14/12 2146 06/14/12 2317 06/15/12 0510 06/15/12 1009  BP: 114/64  109/50   Pulse:   86   Temp: 97.8 F (36.6 C)  97.7 F (36.5 C)   TempSrc: Oral     Resp: 19  19   Height:      Weight:      SpO2: 92% 95% 92% 91%    Intake/Output Summary (Last 24 hours) at 06/15/12 1108 Last data filed at 06/14/12 2115  Gross per 24 hour  Intake     50 ml  Output    250 ml  Net   -200 ml   Filed Weights   06/12/12 1307 06/13/12 0500 06/14/12 1340  Weight: 136.079 kg (300 lb) 136.7 kg (301 lb 5.9 oz) 139.9 kg (308 lb 6.8 oz)    Exam:  General: Alert, awake, oriented x3, in no acute distress.  HEENT: No bruits, no goiter.  Heart: Regular rate and rhythm, without murmurs, rubs, gallops.  Lungs: Good air movement, clear to auscultation. Abdomen: Soft, nontender, nondistended, positive bowel sounds.  Neuro: Grossly  intact, nonfocal.   Data Reviewed: Basic Metabolic Panel:  Lab 06/15/12 1610 06/14/12 1033 06/13/12 0631 06/12/12 1456  NA 139 135 141 140  K 3.4* 3.1* 3.1* 3.2*  CL 101 99 100 96  CO2 26 26 29 31   GLUCOSE 192* 190* 118* 95  BUN 34* 43* 47* 46*  CREATININE 1.39* 2.00* 2.09* 2.09*  CALCIUM 8.9 8.4 8.8 9.4  MG 2.5 -- -- --  PHOS -- -- -- --   Liver Function Tests: No results found for this basename: AST:5,ALT:5,ALKPHOS:5,BILITOT:5,PROT:5,ALBUMIN:5 in the last 168 hours No results found for this basename: LIPASE:5,AMYLASE:5 in the last 168 hours No results found for this basename: AMMONIA:5 in the last 168 hours CBC:  Lab 06/15/12 0425 06/14/12 0610 06/13/12 0631 06/12/12 1456  WBC 8.8 8.8 11.0* 13.3*  NEUTROABS -- -- -- 11.3*  HGB 8.9* 8.6* 8.9* 10.6*  HCT 29.5* 28.4* 29.4* 34.2*  MCV 85.8 85.5 84.5 84.7  PLT 159 144* 159 165   Cardiac Enzymes:  Lab 06/12/12 1456  CKTOTAL --  CKMB --  CKMBINDEX --  TROPONINI <0.30   BNP (last 3 results) No results found for this basename: PROBNP:3 in the last 8760 hours CBG:  Lab 06/15/12 0806 06/14/12 2245 06/14/12 1703 06/14/12 1126 06/14/12 0718  GLUCAP 183* 195* 245* 234* 156*    Recent Results (from the past 240 hour(s))  URINE CULTURE     Status: Normal  Collection Time   06/12/12 10:30 PM      Component Value Range Status Comment   Specimen Description URINE, CLEAN CATCH   Final    Special Requests NONE   Final    Culture  Setup Time 06/13/2012 20:55   Final    Colony Count 45,000 COLONIES/ML   Final    Culture     Final    Value: Multiple bacterial morphotypes present, none predominant. Suggest appropriate recollection if clinically indicated.   Report Status 06/14/2012 FINAL   Final      Studies: Dg Hip Complete Left  06/13/2012  *RADIOLOGY REPORT*  Clinical Data: Left hip pain and leg pain secondary to a fall.  LEFT HIP - COMPLETE 2+ VIEW  Comparison: None.  Findings: No fracture, dislocation, or significant  degenerative change of the left hip.  IMPRESSION: Normal exam.   Original Report Authenticated By: Francene Boyers, M.D.    Ct Cervical Spine Wo Contrast  06/15/2012  *RADIOLOGY REPORT*  Clinical Data: Acute onset of bilateral lower extremity weakness. The patient fell 2 days ago.  CT CERVICAL SPINE WITHOUT CONTRAST  Technique:  Multidetector CT imaging of the cervical spine was performed. Multiplanar CT image reconstructions were also generated.  Comparison: CT scan of the cervical spine dated 06/12/2012  Findings: There is solid anterior fusion at C5-6 and C6-7.  There is no acute fracture, subluxation, prevertebral soft tissue swelling, or other acute abnormality.  There is slight degenerative disc disease at C3-4 and C4-5 with the left foraminal stenosis at C4-5 due to uncinate spurs.  Osteophytes touch the ventral aspect of the spinal cord at C5-6 and C6-7.  IMPRESSION: No acute abnormality of the cervical spine.   Original Report Authenticated By: Francene Boyers, M.D.    Ct Thoracic Spine Wo Contrast  06/15/2012  *RADIOLOGY REPORT*  Clinical Data: Acute onset of bilateral lower extremity weakness after fall 2 days ago.  CT THORACIC SPINE WITHOUT CONTRAST  Technique:  Multidetector CT imaging of the thoracic spine was performed without intravenous contrast administration. Multiplanar CT image reconstructions were also generated  Comparison: None.  Findings: There is no visible fracture or subluxation.  There is no paravertebral soft tissue stranding.  No visible disc protrusions. Flowing anterior osteophytes appear to fuse the thoracic spine from T4 to at least T11.  IMPRESSION: No acute abnormality.  Probable anterior fusion from T4- T11 by osteophytes.   Original Report Authenticated By: Francene Boyers, M.D.    Mr Lumbar Spine Wo Contrast  06/14/2012  *RADIOLOGY REPORT*  Clinical Data: Bilateral lower extremity weakness.  MRI LUMBAR SPINE WITHOUT CONTRAST  Technique:  Multiplanar and multiecho pulse  sequences of the lumbar spine were obtained without intravenous contrast.  Comparison: None.  Findings: Normal alignment of the lumbar vertebral bodies.  They demonstrate normal marrow signal.  The conus medullaris terminates at L1.  Normal MR appearance of the distal thoracic cord.  The spinal canal is generous.  No significant spinal stenosis.  No significant disc protrusions.  Mild bulging annulus at L4-5 with minimal foraminal encroachment bilaterally.  Mild foraminal encroachment on the left at L5-S1 also.  IMPRESSION: Unremarkable lumbar spine MRI examination.  No findings to account for the patient's symptoms.   Original Report Authenticated By: Rudie Meyer, M.D.    US Renal  06/14/2012  *RADIOLOGY REPORT*  Clinical Data: Acute renal failure.  RENAL/URINARY TRACT ULTRASOUND COMPLETE  Comparison:  CT scan 06/16/2006  Findings:  Right Kidney:  11.6 cm in length.  Mild  renal cortical thinning but normal echogenicity.  No hydronephrosis, renal mass or renal calculi.  Left Kidney:  11.8 cm in length.  Mild renal cortical thinning but normal echogenicity.  No focal lesions, hydronephrosis or renal calculi.  Bladder:  Normal.  IMPRESSION: Mild renal cortical thinning but no hydronephrosis or renal mass.   Original Report Authenticated By: Rudie Meyer, M.D.    Dg Chest Port 1 View  06/13/2012  *RADIOLOGY REPORT*  Clinical Data: Post PICC line insertion  PORTABLE CHEST - 1 VIEW  Comparison: 09/11/2011  Findings: Grossly unchanged enlarged cardiac silhouette mediastinal contours. Left upper extremity approach PICC line terminates over the distal SVC.  Grossly unchanged bibasilar heterogeneous opacities, left greater than right.  No new focal airspace opacities.  No definite pleural effusion or pneumothorax. Unchanged bones of the lower cervical ACDF, incompletely evaluated.  IMPRESSION: 1.  Left upper extremity approach PICC line tip terminates over the distal SVC. 2.  Unchanged cardiomegaly.  3.  Persistently  reduced lung volumes with grossly unchanged bibasilar opacities, left greater than right, atelectasis versus infiltrate.   Original Report Authenticated By: Tacey Ruiz, MD    Dg Knee Complete 4 Views Left  06/13/2012  *RADIOLOGY REPORT*  Clinical Data: Left knee pain secondary to a fall.  LEFT KNEE - COMPLETE 4+ VIEW  Comparison: 07/25/2006  Findings: There is no fracture or dislocation.  No joint effusion. Progression of fairly severe osteoarthritis of the lateral compartment and to a lesser degree in the medial compartment.  IMPRESSION: No acute abnormality.  Progressive osteoarthritis.   Original Report Authenticated By: Francene Boyers, M.D.     Scheduled Meds:   . albuterol  2 puff Inhalation BID  . aspirin EC  81 mg Oral Daily  . atorvastatin  80 mg Oral QHS  . fesoterodine  4 mg Oral Daily  . fluticasone  1 spray Each Nare Daily  . gabapentin  100 mg Oral TID  . insulin aspart  0-15 Units Subcutaneous TID WC  . insulin aspart  0-5 Units Subcutaneous QHS  . insulin detemir  30 Units Subcutaneous QHS  . levothyroxine  200 mcg Oral QAC breakfast  . linagliptin  5 mg Oral Daily  . metoprolol succinate  50 mg Oral Daily  . mometasone-formoterol  2 puff Inhalation BID  . pantoprazole  80 mg Oral Daily  . predniSONE  5 mg Oral Daily  . sertraline  50 mg Oral Daily  . sodium chloride  10-40 mL Intracatheter Q12H  . tobramycin  1 drop Both Eyes TID   Continuous Infusions:   . sodium chloride 20 mL/hr (06/14/12 1705)     Radonna Ricker Rosine Beat  [facility] Pager [phone]. If 8PM-8AM, please contact night-coverage at [email or web address], password [facility] 06/15/2012, 11:08 AM  LOS: 3 days

## 2012-06-15 NOTE — Discharge Summary (Addendum)
Physician Discharge Summary  Ann [name] ZOX:[id number] DOB: 1945/07/01 DOA: 06/12/2012  PCP: Fredirick Maudlin, MD  Admit date: 06/12/2012 Discharge date: 06/15/2012  Time spent: 35 minutes  Recommendations for Outpatient Follow-up:  1. Hospital follow with PCP in 2- 4 weeks. 2. Basic metabolic panel in 1 week.  Discharge Diagnoses:  Principal Problem:  *Lower extremity weakness Active Problems:  HYPOTHYROIDISM  HYPERLIPIDEMIA  OVERWEIGHT/OBESITY  HYPERTENSION  CAD  GASTROESOPHAGEAL REFLUX DISEASE  Diabetes mellitus type 2 with complications  Chronic diastolic heart failure  Adrenal insufficiency  DVT (deep venous thrombosis)  Multiple skin tears   Discharge Condition: stable  Diet recommendation: carb modified  Filed Weights   06/12/12 1307 06/13/12 0500 06/14/12 1340  Weight: 136.079 kg (300 lb) 136.7 kg (301 lb 5.9 oz) 139.9 kg (308 lb 6.8 oz)    History of present illness:  67 y.o. Caucasian female with history of COPD, hypertension, GERD, hypothyroidism, morbid BCT, hyperlipidemia, insulin-dependent type 2 diabetes, adrenal insufficiency, DVT on chronic anticoagulation, and coronary artery disease who presented with the above complaints. Patient reports that she has a history of chronic low back pain on 06/12/2012 patient had an elective barium enema as she was leaving the hospital she felt her legs buckle as a result presented back to the emergency department for further evaluation. Patient was admitted at [facility] had imaging of her lumbar spine with x-rays, CT of the C-spine, x-ray of the left hip, chest, and left knee which was all negative except for osteoarthritis. Over the last 2 days patient's lower extremity weakness was not improving as a result her primary care physician transferred her over to [facility] for an emergent MRI and for possible neurosurgery and neurology consultation. Patient denies any recent fevers, chills, nausea, vomiting, chest  pain, shortness of breath, abdominal pain, diarrhea or headaches or vision changes.   Hospital Course:  Lower extremity weakness  -Etiology unclear. Patient does not have any bowel or bladder incontinence. Patient still has intact sensation in her lower extremity.  - MRI 1.20.2014Unremarkable lumbar spine MRI examination.  - CT cervical and thoracic 1.19.2014 no acute abnormaility.  - probable peripheral neuropathy leading to decondition. - resume Rivaroxiban. Follow up with Pulmonary  COPD exacerbation:  - steroids tapared, Inh antibiotics. Antibiotics for 7 days. - continue as an outpatient. Her saturation have remained stable.  - change in color of sputum.  History of DVT  - resume Rivaroxaban.   History of chronic diastolic congestive heart failure with chronic lower extremity edema:  - Patient had a 2-D echocardiogram on 01/12/2010 which showed EF of 65-70%. Saline lock IV fluids. Not being diuresis due to acute renal failure.   Acute renal failure:  - Baseline Cr 0.8-1.0  - Patient's torsemide has been held. Started on IV fluids, creatinine trending down. Resume diuretic as an outpatient at a lower dose. b-met in 1 week - renal US: Mild renal cortical thinning   Adrenal insufficiency  Continue prednisone 5 mg daily.   Procedures:  Renal ultrasound: chronic renal disease (i.e. Studies not automatically included, echos, thoracentesis, etc; not x-rays)  Consultations:  neurology  Discharge Exam: Filed Vitals:   06/14/12 2146 06/14/12 2317 06/15/12 0510 06/15/12 1009  BP: 114/64  109/50   Pulse:   86   Temp: 97.8 F (36.6 C)  97.7 F (36.5 C)   TempSrc: Oral     Resp: 19  19   Height:      Weight:      SpO2:  92% 95% 92% 91%    See progress note.  Discharge Instructions  Discharge Orders    Future Orders Please Complete By Expires   Diet - low sodium heart healthy      Increase activity slowly          Medication List     As of 06/15/2012 12:16 PM     STOP taking these medications         dexamethasone 4 MG tablet   Commonly known as: DECADRON      torsemide 100 MG tablet   Commonly known as: DEMADEX      TAKE these medications         albuterol 108 (90 BASE) MCG/ACT inhaler   Commonly known as: PROVENTIL HFA;VENTOLIN HFA   Inhale 2 puffs into the lungs 2 (two) times daily. For shortness of breath      ALPRAZolam 0.5 MG tablet   Commonly known as: XANAX   Take 0.5 mg by mouth 3 (three) times daily as needed. For anxiety      aspirin EC 81 MG tablet   Take 81 mg by mouth daily.      esomeprazole 40 MG capsule   Commonly known as: NEXIUM   Take 40 mg by mouth 2 (two) times daily.      fluticasone 50 MCG/ACT nasal spray   Commonly known as: FLONASE   Place 1 spray into the nose daily.      Fluticasone-Salmeterol 250-50 MCG/DOSE Aepb   Commonly known as: ADVAIR   Inhale 1 puff into the lungs every 12 (twelve) hours.      furosemide 20 MG tablet   Commonly known as: LASIX   Take 2 tablets (40 mg total) by mouth 2 (two) times daily.      gabapentin 100 MG capsule   Commonly known as: NEURONTIN   Take 100 mg by mouth 3 (three) times daily.      HYDROcodone-acetaminophen 5-325 MG per tablet   Commonly known as: NORCO/VICODIN   Take 1 tablet by mouth every 4 (four) hours as needed.      insulin aspart 100 UNIT/ML injection   Commonly known as: novoLOG   Inject 10-15 Units into the skin 3 (three) times daily before meals. According to Sliding Scale      LEVEMIR FLEXPEN [place]   Inject 30 Units into the skin at bedtime.      levothyroxine 200 MCG tablet   Commonly known as: SYNTHROID, LEVOTHROID   Take 200 mcg by mouth daily.      linagliptin 5 MG Tabs tablet   Commonly known as: TRADJENTA   Take 5 mg by mouth every morning.      metoprolol succinate 50 MG 24 hr tablet   Commonly known as: TOPROL-XL   Take 1 tablet (50 mg total) by mouth daily.      nitroGLYCERIN 0.4 MG SL tablet   Commonly known as: NITROSTAT     Place 1 tablet (0.4 mg total) under the tongue every 5 (five) minutes as needed for chest pain.      predniSONE 10 MG tablet   Commonly known as: DELTASONE   Take 5 mg by mouth daily.      rosuvastatin 40 MG tablet   Commonly known as: CRESTOR   Take 1 tablet (40 mg total) by mouth at bedtime.      sertraline 50 MG tablet   Commonly known as: ZOLOFT   Take 50 mg by mouth daily.  tolterodine 4 MG 24 hr capsule   Commonly known as: DETROL LA   Take 4 mg by mouth every morning.      XARELTO 20 MG Tabs   Generic drug: Rivaroxaban   Take 20 mg by mouth daily.           Follow-up Information    Follow up with HAWKINS,EDWARD L, MD. In 2 weeks. (hospitla follow up)    Contact information:   [address] [place] [address] [phone]           The results of significant diagnostics from this hospitalization (including imaging, microbiology, ancillary and laboratory) are listed below for reference.    Significant Diagnostic Studies: Dg Lumbar Spine Complete  06/12/2012  *RADIOLOGY REPORT*  Clinical Data: Fall.  The patient had barium enema performed earlier today.  LUMBAR SPINE - COMPLETE 4+ VIEW  Comparison: Lumbar spine radiographs 08/22/2010  Findings: There is barium in the colon from a barium enema performed earlier today.  Multiple diverticula are filled with barium in the sigmoid colon.  The colon is nondistended.  Lumbar spine vertebral bodies are normal in height and alignment. Disc space narrowing at L4-5 and L5-S1 is stable.  The remainder of the disc heights are preserved.  There is some facet joint degenerative change on the left at L4-L5 and L5-S1.  Negative for fracture.  Portions of some vertebral bodies are obscured by the barium on the frontal and oblique views.  IMPRESSION:  1.  No acute bony abnormality. 2.  Stable lower lumbar spine degenerative disc disease and facet joint arthropathy.   Original Report Authenticated By: Britta Mccreedy, M.D.    Dg Hip Complete Left  06/13/2012  *RADIOLOGY REPORT*  Clinical Data: Left hip pain and leg pain secondary to a fall.  LEFT HIP - COMPLETE 2+ VIEW  Comparison: None.  Findings: No fracture, dislocation, or significant degenerative change of the left hip.  IMPRESSION: Normal exam.   Original Report Authenticated By: Francene Boyers, M.D.    Ct Cervical Spine Wo Contrast  06/15/2012  *RADIOLOGY REPORT*  Clinical Data: Acute onset of bilateral lower extremity weakness. The patient fell 2 days ago.  CT CERVICAL SPINE WITHOUT CONTRAST  Technique:  Multidetector CT imaging of the cervical spine was performed. Multiplanar CT image reconstructions were also generated.  Comparison: CT scan of the cervical spine dated 06/12/2012  Findings: There is solid anterior fusion at C5-6 and C6-7.  There is no acute fracture, subluxation, prevertebral soft tissue swelling, or other acute abnormality.  There is slight degenerative disc disease at C3-4 and C4-5 with the left foraminal stenosis at C4-5 due to uncinate spurs.  Osteophytes touch the ventral aspect of the spinal cord at C5-6 and C6-7.  IMPRESSION: No acute abnormality of the cervical spine.   Original Report Authenticated By: Francene Boyers, M.D.    Ct Cervical Spine Wo Contrast  06/12/2012  *RADIOLOGY REPORT*  Clinical Data: Fall today  CT CERVICAL SPINE WITHOUT CONTRAST  Technique:  Multidetector CT imaging of the cervical spine was performed. Multiplanar CT image reconstructions were also generated.  Comparison: None.  Findings: Image detail is limited by patient body habitus. Cervical spine is aligned from the skull base through the superior endplate of T2.  There are postoperative changes of anterior cervical discectomy and fusion spanning C5-C7.  No evidence of hardware loosening.  Mild to moderate disc height loss at C3-4 and C4-5.  No acute fracture is identified. Mild left neural  foraminal narrowing at C4-C5.  Mild bilateral neural foraminal  narrowing at C5-C6 and C6-C7.  Mild left neural foraminal narrowing at C7-T1.  The prevertebral soft tissue contours appear within normal limits.  IMPRESSION: 1.  No evidence of acute bony injury to the cervical spine. 2.  Anterior cervical discectomy and fusion spanning C5 this and seven. 3.  Degenerative changes as described above.   Original Report Authenticated By: Britta Mccreedy, M.D.    Ct Thoracic Spine Wo Contrast  06/15/2012  *RADIOLOGY REPORT*  Clinical Data: Acute onset of bilateral lower extremity weakness after fall 2 days ago.  CT THORACIC SPINE WITHOUT CONTRAST  Technique:  Multidetector CT imaging of the thoracic spine was performed without intravenous contrast administration. Multiplanar CT image reconstructions were also generated  Comparison: None.  Findings: There is no visible fracture or subluxation.  There is no paravertebral soft tissue stranding.  No visible disc protrusions. Flowing anterior osteophytes appear to fuse the thoracic spine from T4 to at least T11.  IMPRESSION: No acute abnormality.  Probable anterior fusion from T4- T11 by osteophytes.   Original Report Authenticated By: Francene Boyers, M.D.    Mr Lumbar Spine Wo Contrast  06/14/2012  *RADIOLOGY REPORT*  Clinical Data: Bilateral lower extremity weakness.  MRI LUMBAR SPINE WITHOUT CONTRAST  Technique:  Multiplanar and multiecho pulse sequences of the lumbar spine were obtained without intravenous contrast.  Comparison: None.  Findings: Normal alignment of the lumbar vertebral bodies.  They demonstrate normal marrow signal.  The conus medullaris terminates at L1.  Normal MR appearance of the distal thoracic cord.  The spinal canal is generous.  No significant spinal stenosis.  No significant disc protrusions.  Mild bulging annulus at L4-5 with minimal foraminal encroachment bilaterally.  Mild foraminal encroachment on the left at L5-S1 also.  IMPRESSION: Unremarkable lumbar spine MRI examination.  No findings to account for  the patient's symptoms.   Original Report Authenticated By: Rudie Meyer, M.D.    US Renal  06/14/2012  *RADIOLOGY REPORT*  Clinical Data: Acute renal failure.  RENAL/URINARY TRACT ULTRASOUND COMPLETE  Comparison:  CT scan 06/16/2006  Findings:  Right Kidney:  11.6 cm in length.  Mild renal cortical thinning but normal echogenicity.  No hydronephrosis, renal mass or renal calculi.  Left Kidney:  11.8 cm in length.  Mild renal cortical thinning but normal echogenicity.  No focal lesions, hydronephrosis or renal calculi.  Bladder:  Normal.  IMPRESSION: Mild renal cortical thinning but no hydronephrosis or renal mass.   Original Report Authenticated By: Rudie Meyer, M.D.    Dg Chest Port 1 View  06/13/2012  *RADIOLOGY REPORT*  Clinical Data: Post PICC line insertion  PORTABLE CHEST - 1 VIEW  Comparison: 09/11/2011  Findings: Grossly unchanged enlarged cardiac silhouette mediastinal contours. Left upper extremity approach PICC line terminates over the distal SVC.  Grossly unchanged bibasilar heterogeneous opacities, left greater than right.  No new focal airspace opacities.  No definite pleural effusion or pneumothorax. Unchanged bones of the lower cervical ACDF, incompletely evaluated.  IMPRESSION: 1.  Left upper extremity approach PICC line tip terminates over the distal SVC. 2.  Unchanged cardiomegaly.  3.  Persistently reduced lung volumes with grossly unchanged bibasilar opacities, left greater than right, atelectasis versus infiltrate.   Original Report Authenticated By: Tacey Ruiz, MD    Dg Knee Complete 4 Views Left  06/13/2012  *RADIOLOGY REPORT*  Clinical Data: Left knee pain secondary to a fall.  LEFT KNEE - COMPLETE 4+ VIEW  Comparison:  07/25/2006  Findings: There is no fracture or dislocation.  No joint effusion. Progression of fairly severe osteoarthritis of the lateral compartment and to a lesser degree in the medial compartment.  IMPRESSION: No acute abnormality.  Progressive osteoarthritis.    Original Report Authenticated By: Francene Boyers, M.D.    Dg Colon W/cm - Wo/w Kub  06/12/2012  *RADIOLOGY REPORT*  Clinical Data: Heme positive stools  BARIUM ENEMA:  Technique: After insertion of an enema tip, barium was instilled retrograde into the colon under gravity drip.  Multiple images of the colon were obtained. Exam is aeration is limited by body habitus and limited patient mobility.  Fluoroscopy time: 4.4 minutes  Comparison: None  Findings: Normal bowel gas pattern on scout image. Normal retrograde passage of contrast from the rectum to the cecal tip. Appendix surgically absent. Competent ileocecal valve. Diverticulosis of sigmoid colon. Patient was unable to tolerate any significant degree colonic distention and incontinent of contrast and expelled the enema tip/balloon once during the exam. No gross narrowing or stricture identified. Tiny filling defect identified at the left lateral aspect of the rectum on a single image question tiny polyp versus artifact. No definite or additional persistent intraluminal filling defects. Smooth appearance of colonic mucosa on single contrast exam.  IMPRESSION: Sigmoid diverticulosis. Questionable tiny left lateral rectal polyp.   Original Report Authenticated By: Ulyses Southward, M.D.     Microbiology: Recent Results (from the past 240 hour(s))  URINE CULTURE     Status: Normal   Collection Time   06/12/12 10:30 PM      Component Value Range Status Comment   Specimen Description URINE, CLEAN CATCH   Final    Special Requests NONE   Final    Culture  Setup Time 06/13/2012 20:55   Final    Colony Count 45,000 COLONIES/ML   Final    Culture     Final    Value: Multiple bacterial morphotypes present, none predominant. Suggest appropriate recollection if clinically indicated.   Report Status 06/14/2012 FINAL   Final      Labs: Basic Metabolic Panel:  Lab 06/15/12 4098 06/14/12 1033 06/13/12 0631 06/12/12 1456  NA 139 135 141 140  K 3.4* 3.1* 3.1*  3.2*  CL 101 99 100 96  CO2 26 26 29 31   GLUCOSE 192* 190* 118* 95  BUN 34* 43* 47* 46*  CREATININE 1.39* 2.00* 2.09* 2.09*  CALCIUM 8.9 8.4 8.8 9.4  MG 2.5 -- -- --  PHOS -- -- -- --   Liver Function Tests: No results found for this basename: AST:5,ALT:5,ALKPHOS:5,BILITOT:5,PROT:5,ALBUMIN:5 in the last 168 hours No results found for this basename: LIPASE:5,AMYLASE:5 in the last 168 hours No results found for this basename: AMMONIA:5 in the last 168 hours CBC:  Lab 06/15/12 0425 06/14/12 0610 06/13/12 0631 06/12/12 1456  WBC 8.8 8.8 11.0* 13.3*  NEUTROABS -- -- -- 11.3*  HGB 8.9* 8.6* 8.9* 10.6*  HCT 29.5* 28.4* 29.4* 34.2*  MCV 85.8 85.5 84.5 84.7  PLT 159 144* 159 165   Cardiac Enzymes:  Lab 06/12/12 1456  CKTOTAL --  CKMB --  CKMBINDEX --  TROPONINI <0.30   BNP: BNP (last 3 results) No results found for this basename: PROBNP:3 in the last 8760 hours CBG:  Lab 06/15/12 0806 06/14/12 2245 06/14/12 1703 06/14/12 1126 06/14/12 0718  GLUCAP 183* 195* 245* 234* 156*       Signed:  Marinda Elk  [facility] 06/15/2012, 12:16 PM

## 2012-06-15 NOTE — Consult Note (Signed)
WOC consult Note Reason for Consult: multiple skin tears upper arms.  Pt has chronic steroid used, hx. COPD.  Pt reports her skin is very easily broken Wound type: skin tears secondary to fail thin skin. Measurement: see bedside documentation flow sheets from nursing. Discussed skin tears with bedside nursing, they report they are non infected and seem to have improved now. Not actively oozing. Will implement non adherent with Vaseline gauze to prevent sticking to the wounds.  Did visualize the right upper arm skin tear, skin flap in place. Wound dry and pink.  Dressing procedure/placement/frequency: Vaseline gauze to all open areas of the bil. Upper extremities, wrap with kerlix or conform, secure with tape. Change daily.  Re consult if needed, will not follow at this time. Thanks  Mac Dowdell [employer], CWOCN [phone])

## 2012-06-16 ENCOUNTER — Encounter: Payer: Self-pay | Admitting: Gastroenterology

## 2012-06-16 DIAGNOSIS — N289 Disorder of kidney and ureter, unspecified: Secondary | ICD-10-CM | POA: Diagnosis not present

## 2012-06-16 DIAGNOSIS — I82409 Acute embolism and thrombosis of unspecified deep veins of unspecified lower extremity: Secondary | ICD-10-CM | POA: Diagnosis not present

## 2012-06-16 DIAGNOSIS — E2749 Other adrenocortical insufficiency: Secondary | ICD-10-CM | POA: Diagnosis not present

## 2012-06-16 DIAGNOSIS — I10 Essential (primary) hypertension: Secondary | ICD-10-CM

## 2012-06-16 DIAGNOSIS — E118 Type 2 diabetes mellitus with unspecified complications: Secondary | ICD-10-CM | POA: Diagnosis not present

## 2012-06-16 LAB — GLUCOSE, CAPILLARY: Glucose-Capillary: 192 mg/dL — ABNORMAL HIGH (ref 70–99)

## 2012-06-16 MED ORDER — HYDROCODONE-ACETAMINOPHEN 5-325 MG PO TABS: 1.0000 | ORAL_TABLET | ORAL | Status: DC | PRN

## 2012-06-16 MED ORDER — METHYLPREDNISOLONE SODIUM SUCC 125 MG IJ SOLR
80.0000 mg | Freq: Once | INTRAMUSCULAR | Status: AC
  Administered 2012-06-16: 80 mg via INTRAVENOUS
  Filled 2012-06-16: qty 1.28

## 2012-06-16 MED ORDER — PREDNISONE 10 MG PO TABS: 5.0000 mg | ORAL_TABLET | Freq: Every day | ORAL | Status: DC

## 2012-06-16 MED ORDER — ALPRAZOLAM 0.5 MG PO TABS: 0.5000 mg | ORAL_TABLET | Freq: Three times a day (TID) | ORAL | Status: DC | PRN

## 2012-06-16 MED ORDER — PREDNISONE 10 MG PO TABS: ORAL_TABLET | ORAL | Status: DC

## 2012-06-16 MED ORDER — LEVOFLOXACIN 500 MG PO TABS
500.0000 mg | ORAL_TABLET | Freq: Every day | ORAL | Status: DC
  Administered 2012-06-16: 500 mg via ORAL
  Filled 2012-06-16: qty 1

## 2012-06-16 MED ORDER — LEVOFLOXACIN 500 MG PO TABS: 500.0000 mg | ORAL_TABLET | Freq: Every day | ORAL | Status: DC

## 2012-06-16 NOTE — Clinical Social Work Psychosocial (Signed)
     Clinical Social Work Department BRIEF PSYCHOSOCIAL ASSESSMENT 06/16/2012  Patient:  Ann [name], Ann [name]     Account Number:  [id number]     Admit date:  06/12/2012  Clinical Social Worker:  Hulan Fray  Date/Time:  06/16/2012 10:14 AM  Referred by:  Physician  Date Referred:  06/15/2012 Referred for  SNF Placement   Other Referral:   Interview type:  Patient Other interview type:   Husband- [name]    PSYCHOSOCIAL DATA Living Status:  FAMILY Admitted from facility:   Level of care:   Primary support name:  [name] Primary support relationship to patient:  SPOUSE Degree of support available:   supportive    CURRENT CONCERNS Current Concerns  Post-Acute Placement   Other Concerns:    SOCIAL WORK ASSESSMENT / PLAN Clinical Social Worker received referral for SNF placement. CSW introduced self and explained reason for visit. Patient's husband was at bedside. CSW provided SNF packet and husband reported that he prefers [facility]. CSW encouraged husband and patient to think of an alternative facility and husband reported that [facility] would be second choice. CSW will complete FL2 for MD's signature and continue to follow and update family when bed offers are made.   Assessment/plan status:  Psychosocial Support/Ongoing Assessment of Needs Other assessment/ plan:   Information/referral to community resources:   SNF packet    PATIENTS/FAMILYS RESPONSE TO PLAN OF CARE: Patient and husband are agreeable for SNF placement at discharge. Patient and husband are preferring [facility] as first choice and [facility] SNF as second.

## 2012-06-16 NOTE — Clinical Social Work Placement (Addendum)
    Clinical Social Work Department CLINICAL SOCIAL WORK PLACEMENT NOTE 06/16/2012  Patient:  ALDENA, WORM  Account Number:  [id number] Admit date:  06/12/2012  Clinical Social Worker:  Hulan Fray  Date/time:  06/16/2012 11:00 AM  Clinical Social Work is seeking post-discharge placement for this patient at the following level of care:   SKILLED NURSING   (*CSW will update this form in Epic as items are completed)   06/16/2012  Patient/family provided with [facility] Department of Clinical Social Work's list of facilities offering this level of care within the geographic area requested by the patient (or if unable, by the patient's family).  06/16/2012  Patient/family informed of their freedom to choose among providers that offer the needed level of care, that participate in Medicare, Medicaid or managed care program needed by the patient, have an available bed and are willing to accept the patient.  06/16/2012  Patient/family informed of [facility]' ownership interest in [facility], as well as of the fact that they are under no obligation to receive care at this facility.  PASARR submitted to EDS on 06/16/2012 PASARR number received from EDS on 06/16/2012  FL2 transmitted to all facilities in geographic area requested by pt/family on  06/16/2012 FL2 transmitted to all facilities within larger geographic area on   Patient informed that his/her managed care company has contracts with or will negotiate with  certain facilities, including the following:     Patient/family informed of bed offers received: 06-16-2012 Patient chooses bed at [facility] Physician recommends and patient chooses bed at    Patient to be transferred to [facility] on 06-16-12   Patient to be transferred to facility by [facility]  The following physician request were entered in Epic:   Additional Comments:

## 2012-06-16 NOTE — Progress Notes (Signed)
[facility] PROGRESS NOTE Assessment/Plan: Lower extremity weakness  -Etiology unclear. ? - probable peripheral neuropathy.Patient does not have any bowel or bladder incontinence. Patient still has intact sensation in her lower extremity.  - MRI 1.20.2014Unremarkable lumbar spine MRI examination. - CT cervical and thoracic 1.19.2014 no acute abnormaility. - Rivaroxiban.  COPD exacerbation: - steroids, Inh antibiotics. - continue as an outpatient. Her saturation have remained stable. - change in color of sputum.  History of DVT  - resume Rivaroxaban.  History of chronic diastolic congestive heart failure with chronic lower extremity edema: - Patient had a 2-D echocardiogram on 01/12/2010 which showed EF of 65-70%.   Acute renal failure: - Baseline Cr 0.8-1.0 - Patient's torsemide has been held.  - renal US: Mild renal cortical thinning   Adrenal insufficiency  Continue prednisone 5 mg daily.  Code Status: DNR/DNI Family Communication:  Disposition Plan: TBD   Consultants:  Neurology  Procedures:  MRI  CT cervical and lumbar  Antibiotics:  none  HPI/Subjective: Now with cough, productive sputum change in color   Objective: Filed Vitals:   06/15/12 1453 06/15/12 2106 06/15/12 2141 06/16/12 0500  BP: 112/52  117/59 103/61  Pulse: 71  65 71  Temp: 98.6 F (37 C)  98.8 F (37.1 C) 98.4 F (36.9 C)  TempSrc:   Oral Oral  Resp: 18  20 18   Height:      Weight:      SpO2: 91% 92% 92% 92%    Intake/Output Summary (Last 24 hours) at 06/16/12 0756 Last data filed at 06/16/12 0522  Gross per 24 hour  Intake 1267.33 ml  Output    200 ml  Net 1067.33 ml   Filed Weights   06/12/12 1307 06/13/12 0500 06/14/12 1340  Weight: 136.079 kg (300 lb) 136.7 kg (301 lb 5.9 oz) 139.9 kg (308 lb 6.8 oz)    Exam:  General: Alert, awake, oriented x3, in no acute distress.  HEENT: No bruits, no goiter.  Heart: Regular rate and rhythm, without murmurs, rubs,  gallops.  Lungs: Good air movement, wheezing B/L. Abdomen: Soft, nontender, nondistended, positive bowel sounds.  Neuro: Grossly intact, nonfocal.   Data Reviewed: Basic Metabolic Panel:  Lab 06/15/12 6962 06/14/12 1033 06/13/12 0631 06/12/12 1456  NA 139 135 141 140  K 3.4* 3.1* 3.1* 3.2*  CL 101 99 100 96  CO2 26 26 29 31   GLUCOSE 192* 190* 118* 95  BUN 34* 43* 47* 46*  CREATININE 1.39* 2.00* 2.09* 2.09*  CALCIUM 8.9 8.4 8.8 9.4  MG 2.5 -- -- --  PHOS -- -- -- --   Liver Function Tests: No results found for this basename: AST:5,ALT:5,ALKPHOS:5,BILITOT:5,PROT:5,ALBUMIN:5 in the last 168 hours No results found for this basename: LIPASE:5,AMYLASE:5 in the last 168 hours No results found for this basename: AMMONIA:5 in the last 168 hours CBC:  Lab 06/15/12 0425 06/14/12 0610 06/13/12 0631 06/12/12 1456  WBC 8.8 8.8 11.0* 13.3*  NEUTROABS -- -- -- 11.3*  HGB 8.9* 8.6* 8.9* 10.6*  HCT 29.5* 28.4* 29.4* 34.2*  MCV 85.8 85.5 84.5 84.7  PLT 159 144* 159 165   Cardiac Enzymes:  Lab 06/12/12 1456  CKTOTAL --  CKMB --  CKMBINDEX --  TROPONINI <0.30   BNP (last 3 results) No results found for this basename: PROBNP:3 in the last 8760 hours CBG:  Lab 06/16/12 0736 06/15/12 2140 06/15/12 1727 06/15/12 1218 06/15/12 0806  GLUCAP 127* 230* 230* 209* 183*    Recent Results (from the past  240 hour(s))  URINE CULTURE     Status: Normal   Collection Time   06/12/12 10:30 PM      Component Value Range Status Comment   Specimen Description URINE, CLEAN CATCH   Final    Special Requests NONE   Final    Culture  Setup Time 06/13/2012 20:55   Final    Colony Count 45,000 COLONIES/ML   Final    Culture     Final    Value: Multiple bacterial morphotypes present, none predominant. Suggest appropriate recollection if clinically indicated.   Report Status 06/14/2012 FINAL   Final      Studies: Ct Cervical Spine Wo Contrast  06/15/2012  *RADIOLOGY REPORT*  Clinical Data: Acute  onset of bilateral lower extremity weakness. The patient fell 2 days ago.  CT CERVICAL SPINE WITHOUT CONTRAST  Technique:  Multidetector CT imaging of the cervical spine was performed. Multiplanar CT image reconstructions were also generated.  Comparison: CT scan of the cervical spine dated 06/12/2012  Findings: There is solid anterior fusion at C5-6 and C6-7.  There is no acute fracture, subluxation, prevertebral soft tissue swelling, or other acute abnormality.  There is slight degenerative disc disease at C3-4 and C4-5 with the left foraminal stenosis at C4-5 due to uncinate spurs.  Osteophytes touch the ventral aspect of the spinal cord at C5-6 and C6-7.  IMPRESSION: No acute abnormality of the cervical spine.   Original Report Authenticated By: Francene Boyers, M.D.    Ct Thoracic Spine Wo Contrast  06/15/2012  *RADIOLOGY REPORT*  Clinical Data: Acute onset of bilateral lower extremity weakness after fall 2 days ago.  CT THORACIC SPINE WITHOUT CONTRAST  Technique:  Multidetector CT imaging of the thoracic spine was performed without intravenous contrast administration. Multiplanar CT image reconstructions were also generated  Comparison: None.  Findings: There is no visible fracture or subluxation.  There is no paravertebral soft tissue stranding.  No visible disc protrusions. Flowing anterior osteophytes appear to fuse the thoracic spine from T4 to at least T11.  IMPRESSION: No acute abnormality.  Probable anterior fusion from T4- T11 by osteophytes.   Original Report Authenticated By: Francene Boyers, M.D.    Mr Lumbar Spine Wo Contrast  06/14/2012  *RADIOLOGY REPORT*  Clinical Data: Bilateral lower extremity weakness.  MRI LUMBAR SPINE WITHOUT CONTRAST  Technique:  Multiplanar and multiecho pulse sequences of the lumbar spine were obtained without intravenous contrast.  Comparison: None.  Findings: Normal alignment of the lumbar vertebral bodies.  They demonstrate normal marrow signal.  The conus  medullaris terminates at L1.  Normal MR appearance of the distal thoracic cord.  The spinal canal is generous.  No significant spinal stenosis.  No significant disc protrusions.  Mild bulging annulus at L4-5 with minimal foraminal encroachment bilaterally.  Mild foraminal encroachment on the left at L5-S1 also.  IMPRESSION: Unremarkable lumbar spine MRI examination.  No findings to account for the patient's symptoms.   Original Report Authenticated By: Rudie Meyer, M.D.    US Renal  06/14/2012  *RADIOLOGY REPORT*  Clinical Data: Acute renal failure.  RENAL/URINARY TRACT ULTRASOUND COMPLETE  Comparison:  CT scan 06/16/2006  Findings:  Right Kidney:  11.6 cm in length.  Mild renal cortical thinning but normal echogenicity.  No hydronephrosis, renal mass or renal calculi.  Left Kidney:  11.8 cm in length.  Mild renal cortical thinning but normal echogenicity.  No focal lesions, hydronephrosis or renal calculi.  Bladder:  Normal.  IMPRESSION: Mild renal cortical thinning  but no hydronephrosis or renal mass.   Original Report Authenticated By: Rudie Meyer, M.D.     Scheduled Meds:    . albuterol  2 puff Inhalation BID  . aspirin EC  81 mg Oral Daily  . atorvastatin  80 mg Oral QHS  . fesoterodine  4 mg Oral Daily  . fluticasone  1 spray Each Nare Daily  . gabapentin  100 mg Oral TID  . insulin aspart  0-15 Units Subcutaneous TID WC  . insulin aspart  0-5 Units Subcutaneous QHS  . insulin detemir  30 Units Subcutaneous QHS  . levothyroxine  200 mcg Oral QAC breakfast  . linagliptin  5 mg Oral Daily  . metoprolol succinate  50 mg Oral Daily  . mometasone-formoterol  2 puff Inhalation BID  . pantoprazole  80 mg Oral Daily  . predniSONE  5 mg Oral Daily  . Rivaroxaban  20 mg Oral Q supper  . sertraline  50 mg Oral Daily  . sodium chloride  10-40 mL Intracatheter Q12H  . tobramycin  1 drop Both Eyes TID   Continuous Infusions:    . sodium chloride 20 mL/hr (06/14/12 1705)     Radonna Ricker Rosine Beat  [facility] Pager [phone]. If 8PM-8AM, please contact night-coverage at [email or web address], password [facility] 06/16/2012, 7:56 AM  LOS: 4 days

## 2012-06-16 NOTE — Clinical Social Work Note (Signed)
Clinical Social Worker facilitated discharge by contacting patient regarding bed offer made by first choice, [facility]. Patient reported that she will be transported via ambulance. CSW will complete discharge packet. CSW will sign off, as social work intervention is no longer needed.   Rozetta Nunnery MSW, [employer] [phone]

## 2012-06-17 NOTE — Progress Notes (Signed)
After finding out that the patient did not meet inpatient criteria and that patients insurance would not cover [facility] Pt was then told by case manager that she would be dc home. Patient was the put in wheel chair and NT tried to get her in the car. Pt was unable to get into vehicle and we have to wait for another vehicle that was lower to the ground. Pt prenisolone, levequin, and lasix were called in the [facility] pharmacy in [place].

## 2012-06-19 DIAGNOSIS — N189 Chronic kidney disease, unspecified: Secondary | ICD-10-CM | POA: Diagnosis not present

## 2012-06-19 DIAGNOSIS — J449 Chronic obstructive pulmonary disease, unspecified: Secondary | ICD-10-CM | POA: Diagnosis not present

## 2012-06-19 DIAGNOSIS — F411 Generalized anxiety disorder: Secondary | ICD-10-CM | POA: Diagnosis not present

## 2012-06-19 DIAGNOSIS — G589 Mononeuropathy, unspecified: Secondary | ICD-10-CM | POA: Diagnosis not present

## 2012-06-22 DIAGNOSIS — E039 Hypothyroidism, unspecified: Secondary | ICD-10-CM | POA: Diagnosis not present

## 2012-06-22 DIAGNOSIS — I10 Essential (primary) hypertension: Secondary | ICD-10-CM | POA: Diagnosis not present

## 2012-06-22 DIAGNOSIS — E2749 Other adrenocortical insufficiency: Secondary | ICD-10-CM | POA: Diagnosis not present

## 2012-06-22 DIAGNOSIS — E119 Type 2 diabetes mellitus without complications: Secondary | ICD-10-CM | POA: Diagnosis not present

## 2012-06-22 DIAGNOSIS — E559 Vitamin D deficiency, unspecified: Secondary | ICD-10-CM | POA: Diagnosis not present

## 2012-06-26 DIAGNOSIS — R0602 Shortness of breath: Secondary | ICD-10-CM | POA: Diagnosis not present

## 2012-06-26 DIAGNOSIS — J449 Chronic obstructive pulmonary disease, unspecified: Secondary | ICD-10-CM | POA: Diagnosis not present

## 2012-06-27 ENCOUNTER — Encounter (HOSPITAL_COMMUNITY): Payer: Self-pay | Admitting: *Deleted

## 2012-06-27 ENCOUNTER — Emergency Department (HOSPITAL_COMMUNITY): Payer: Medicare Other

## 2012-06-27 ENCOUNTER — Inpatient Hospital Stay (HOSPITAL_COMMUNITY)
Admission: EM | Admit: 2012-06-27 | Discharge: 2012-07-06 | DRG: 682 | Disposition: A | Discharge status: Home Health | Payer: Medicare Other | Attending: Internal Medicine | Admitting: Internal Medicine

## 2012-06-27 ENCOUNTER — Inpatient Hospital Stay (HOSPITAL_COMMUNITY): Payer: Medicare Other

## 2012-06-27 DIAGNOSIS — H6120 Impacted cerumen, unspecified ear: Secondary | ICD-10-CM | POA: Diagnosis present

## 2012-06-27 DIAGNOSIS — Z7982 Long term (current) use of aspirin: Secondary | ICD-10-CM

## 2012-06-27 DIAGNOSIS — N390 Urinary tract infection, site not specified: Secondary | ICD-10-CM | POA: Diagnosis present

## 2012-06-27 DIAGNOSIS — E663 Overweight: Secondary | ICD-10-CM | POA: Diagnosis present

## 2012-06-27 DIAGNOSIS — J4489 Other specified chronic obstructive pulmonary disease: Secondary | ICD-10-CM | POA: Diagnosis present

## 2012-06-27 DIAGNOSIS — N189 Chronic kidney disease, unspecified: Secondary | ICD-10-CM

## 2012-06-27 DIAGNOSIS — Z6841 Body Mass Index (BMI) 40.0 and over, adult: Secondary | ICD-10-CM

## 2012-06-27 DIAGNOSIS — R609 Edema, unspecified: Secondary | ICD-10-CM

## 2012-06-27 DIAGNOSIS — E2749 Other adrenocortical insufficiency: Secondary | ICD-10-CM | POA: Diagnosis not present

## 2012-06-27 DIAGNOSIS — G929 Unspecified toxic encephalopathy: Secondary | ICD-10-CM | POA: Diagnosis present

## 2012-06-27 DIAGNOSIS — G8929 Other chronic pain: Secondary | ICD-10-CM | POA: Diagnosis present

## 2012-06-27 DIAGNOSIS — I129 Hypertensive chronic kidney disease with stage 1 through stage 4 chronic kidney disease, or unspecified chronic kidney disease: Secondary | ICD-10-CM | POA: Diagnosis present

## 2012-06-27 DIAGNOSIS — E538 Deficiency of other specified B group vitamins: Secondary | ICD-10-CM | POA: Diagnosis present

## 2012-06-27 DIAGNOSIS — G92 Toxic encephalopathy: Secondary | ICD-10-CM | POA: Diagnosis present

## 2012-06-27 DIAGNOSIS — I498 Other specified cardiac arrhythmias: Secondary | ICD-10-CM | POA: Diagnosis present

## 2012-06-27 DIAGNOSIS — Z981 Arthrodesis status: Secondary | ICD-10-CM

## 2012-06-27 DIAGNOSIS — Z79899 Other long term (current) drug therapy: Secondary | ICD-10-CM

## 2012-06-27 DIAGNOSIS — M6740 Ganglion, unspecified site: Secondary | ICD-10-CM

## 2012-06-27 DIAGNOSIS — N183 Chronic kidney disease, stage 3 unspecified: Secondary | ICD-10-CM | POA: Diagnosis present

## 2012-06-27 DIAGNOSIS — R5381 Other malaise: Secondary | ICD-10-CM | POA: Diagnosis not present

## 2012-06-27 DIAGNOSIS — E1142 Type 2 diabetes mellitus with diabetic polyneuropathy: Secondary | ICD-10-CM | POA: Diagnosis present

## 2012-06-27 DIAGNOSIS — E1149 Type 2 diabetes mellitus with other diabetic neurological complication: Secondary | ICD-10-CM | POA: Diagnosis present

## 2012-06-27 DIAGNOSIS — Z9861 Coronary angioplasty status: Secondary | ICD-10-CM

## 2012-06-27 DIAGNOSIS — E2740 Unspecified adrenocortical insufficiency: Secondary | ICD-10-CM | POA: Diagnosis present

## 2012-06-27 DIAGNOSIS — R4182 Altered mental status, unspecified: Secondary | ICD-10-CM | POA: Diagnosis not present

## 2012-06-27 DIAGNOSIS — F411 Generalized anxiety disorder: Secondary | ICD-10-CM | POA: Diagnosis present

## 2012-06-27 DIAGNOSIS — M549 Dorsalgia, unspecified: Secondary | ICD-10-CM | POA: Diagnosis present

## 2012-06-27 DIAGNOSIS — D649 Anemia, unspecified: Secondary | ICD-10-CM | POA: Diagnosis not present

## 2012-06-27 DIAGNOSIS — I5032 Chronic diastolic (congestive) heart failure: Secondary | ICD-10-CM

## 2012-06-27 DIAGNOSIS — E1165 Type 2 diabetes mellitus with hyperglycemia: Secondary | ICD-10-CM | POA: Diagnosis not present

## 2012-06-27 DIAGNOSIS — Z86718 Personal history of other venous thrombosis and embolism: Secondary | ICD-10-CM

## 2012-06-27 DIAGNOSIS — I482 Chronic atrial fibrillation, unspecified: Secondary | ICD-10-CM | POA: Diagnosis present

## 2012-06-27 DIAGNOSIS — L03119 Cellulitis of unspecified part of limb: Secondary | ICD-10-CM | POA: Diagnosis not present

## 2012-06-27 DIAGNOSIS — I2510 Atherosclerotic heart disease of native coronary artery without angina pectoris: Secondary | ICD-10-CM | POA: Diagnosis present

## 2012-06-27 DIAGNOSIS — E876 Hypokalemia: Secondary | ICD-10-CM | POA: Diagnosis present

## 2012-06-27 DIAGNOSIS — Z794 Long term (current) use of insulin: Secondary | ICD-10-CM

## 2012-06-27 DIAGNOSIS — E1129 Type 2 diabetes mellitus with other diabetic kidney complication: Secondary | ICD-10-CM | POA: Diagnosis present

## 2012-06-27 DIAGNOSIS — R0989 Other specified symptoms and signs involving the circulatory and respiratory systems: Secondary | ICD-10-CM | POA: Diagnosis not present

## 2012-06-27 DIAGNOSIS — L02419 Cutaneous abscess of limb, unspecified: Secondary | ICD-10-CM | POA: Diagnosis not present

## 2012-06-27 DIAGNOSIS — E119 Type 2 diabetes mellitus without complications: Secondary | ICD-10-CM | POA: Diagnosis not present

## 2012-06-27 DIAGNOSIS — J449 Chronic obstructive pulmonary disease, unspecified: Secondary | ICD-10-CM | POA: Diagnosis present

## 2012-06-27 DIAGNOSIS — E785 Hyperlipidemia, unspecified: Secondary | ICD-10-CM | POA: Diagnosis present

## 2012-06-27 DIAGNOSIS — K5900 Constipation, unspecified: Secondary | ICD-10-CM | POA: Diagnosis present

## 2012-06-27 DIAGNOSIS — R5383 Other fatigue: Secondary | ICD-10-CM | POA: Diagnosis not present

## 2012-06-27 DIAGNOSIS — R079 Chest pain, unspecified: Secondary | ICD-10-CM | POA: Diagnosis not present

## 2012-06-27 DIAGNOSIS — K219 Gastro-esophageal reflux disease without esophagitis: Secondary | ICD-10-CM | POA: Diagnosis present

## 2012-06-27 DIAGNOSIS — G9340 Encephalopathy, unspecified: Secondary | ICD-10-CM | POA: Diagnosis present

## 2012-06-27 DIAGNOSIS — I4891 Unspecified atrial fibrillation: Secondary | ICD-10-CM | POA: Diagnosis present

## 2012-06-27 DIAGNOSIS — J9819 Other pulmonary collapse: Secondary | ICD-10-CM | POA: Diagnosis not present

## 2012-06-27 DIAGNOSIS — K5730 Diverticulosis of large intestine without perforation or abscess without bleeding: Secondary | ICD-10-CM | POA: Diagnosis not present

## 2012-06-27 DIAGNOSIS — J322 Chronic ethmoidal sinusitis: Secondary | ICD-10-CM | POA: Diagnosis present

## 2012-06-27 DIAGNOSIS — N058 Unspecified nephritic syndrome with other morphologic changes: Secondary | ICD-10-CM | POA: Diagnosis present

## 2012-06-27 DIAGNOSIS — Z7901 Long term (current) use of anticoagulants: Secondary | ICD-10-CM

## 2012-06-27 DIAGNOSIS — E039 Hypothyroidism, unspecified: Secondary | ICD-10-CM | POA: Diagnosis not present

## 2012-06-27 DIAGNOSIS — IMO0002 Reserved for concepts with insufficient information to code with codable children: Secondary | ICD-10-CM

## 2012-06-27 DIAGNOSIS — E7800 Pure hypercholesterolemia, unspecified: Secondary | ICD-10-CM

## 2012-06-27 DIAGNOSIS — R443 Hallucinations, unspecified: Secondary | ICD-10-CM | POA: Diagnosis present

## 2012-06-27 DIAGNOSIS — D509 Iron deficiency anemia, unspecified: Secondary | ICD-10-CM | POA: Diagnosis present

## 2012-06-27 DIAGNOSIS — M129 Arthropathy, unspecified: Secondary | ICD-10-CM | POA: Diagnosis present

## 2012-06-27 DIAGNOSIS — L03116 Cellulitis of left lower limb: Secondary | ICD-10-CM | POA: Diagnosis present

## 2012-06-27 DIAGNOSIS — N179 Acute kidney failure, unspecified: Principal | ICD-10-CM | POA: Diagnosis present

## 2012-06-27 DIAGNOSIS — R29898 Other symptoms and signs involving the musculoskeletal system: Secondary | ICD-10-CM

## 2012-06-27 DIAGNOSIS — Z87891 Personal history of nicotine dependence: Secondary | ICD-10-CM

## 2012-06-27 DIAGNOSIS — R600 Localized edema: Secondary | ICD-10-CM | POA: Diagnosis present

## 2012-06-27 DIAGNOSIS — J321 Chronic frontal sinusitis: Secondary | ICD-10-CM | POA: Diagnosis present

## 2012-06-27 DIAGNOSIS — R0609 Other forms of dyspnea: Secondary | ICD-10-CM | POA: Diagnosis present

## 2012-06-27 DIAGNOSIS — E118 Type 2 diabetes mellitus with unspecified complications: Secondary | ICD-10-CM

## 2012-06-27 DIAGNOSIS — I10 Essential (primary) hypertension: Secondary | ICD-10-CM | POA: Diagnosis present

## 2012-06-27 DIAGNOSIS — J441 Chronic obstructive pulmonary disease with (acute) exacerbation: Secondary | ICD-10-CM | POA: Diagnosis not present

## 2012-06-27 DIAGNOSIS — S50909A Unspecified superficial injury of unspecified elbow, initial encounter: Secondary | ICD-10-CM | POA: Diagnosis not present

## 2012-06-27 DIAGNOSIS — T148XXA Other injury of unspecified body region, initial encounter: Secondary | ICD-10-CM

## 2012-06-27 DIAGNOSIS — I82409 Acute embolism and thrombosis of unspecified deep veins of unspecified lower extremity: Secondary | ICD-10-CM | POA: Diagnosis not present

## 2012-06-27 DIAGNOSIS — R0602 Shortness of breath: Secondary | ICD-10-CM | POA: Diagnosis present

## 2012-06-27 HISTORY — DX: Other symptoms and signs involving the musculoskeletal system: R29.898

## 2012-06-27 HISTORY — DX: Deficiency of other specified B group vitamins: E53.8

## 2012-06-27 HISTORY — DX: Chronic frontal sinusitis: J32.1

## 2012-06-27 HISTORY — DX: Hypokalemia: E87.6

## 2012-06-27 HISTORY — DX: Rectal polyp: K62.1

## 2012-06-27 HISTORY — DX: Chronic diastolic (congestive) heart failure: I50.32

## 2012-06-27 LAB — COMPREHENSIVE METABOLIC PANEL WITH GFR
ALT: 38 U/L — ABNORMAL HIGH (ref 0–35)
AST: 19 U/L (ref 0–37)
Albumin: 3 g/dL — ABNORMAL LOW (ref 3.5–5.2)
Alkaline Phosphatase: 90 U/L (ref 39–117)
BUN: 25 mg/dL — ABNORMAL HIGH (ref 6–23)
CO2: 28 meq/L (ref 19–32)
Calcium: 8.7 mg/dL (ref 8.4–10.5)
Chloride: 93 meq/L — ABNORMAL LOW (ref 96–112)
Creatinine, Ser: 2.48 mg/dL — ABNORMAL HIGH (ref 0.50–1.10)
GFR calc Af Amer: 22 mL/min — ABNORMAL LOW
GFR calc non Af Amer: 19 mL/min — ABNORMAL LOW
Glucose, Bld: 148 mg/dL — ABNORMAL HIGH (ref 70–99)
Potassium: 2.3 meq/L — CL (ref 3.5–5.1)
Sodium: 135 meq/L (ref 135–145)
Total Bilirubin: 0.6 mg/dL (ref 0.3–1.2)
Total Protein: 6.6 g/dL (ref 6.0–8.3)

## 2012-06-27 LAB — URINE MICROSCOPIC-ADD ON

## 2012-06-27 LAB — CBC WITH DIFFERENTIAL/PLATELET
Basophils Absolute: 0 K/uL (ref 0.0–0.1)
Basophils Relative: 0 % (ref 0–1)
Eosinophils Absolute: 0.1 K/uL (ref 0.0–0.7)
Eosinophils Relative: 1 % (ref 0–5)
HCT: 30.8 % — ABNORMAL LOW (ref 36.0–46.0)
Hemoglobin: 9.2 g/dL — ABNORMAL LOW (ref 12.0–15.0)
Lymphocytes Relative: 11 % — ABNORMAL LOW (ref 12–46)
Lymphs Abs: 1.4 K/uL (ref 0.7–4.0)
MCH: 24.8 pg — ABNORMAL LOW (ref 26.0–34.0)
MCHC: 29.9 g/dL — ABNORMAL LOW (ref 30.0–36.0)
MCV: 83 fL (ref 78.0–100.0)
Monocytes Absolute: 1.1 K/uL — ABNORMAL HIGH (ref 0.1–1.0)
Monocytes Relative: 9 % (ref 3–12)
Neutro Abs: 9.6 K/uL — ABNORMAL HIGH (ref 1.7–7.7)
Neutrophils Relative %: 79 % — ABNORMAL HIGH (ref 43–77)
Platelets: 224 K/uL (ref 150–400)
RBC: 3.71 MIL/uL — ABNORMAL LOW (ref 3.87–5.11)
RDW: 17.6 % — ABNORMAL HIGH (ref 11.5–15.5)
WBC: 12.2 K/uL — ABNORMAL HIGH (ref 4.0–10.5)

## 2012-06-27 LAB — URINALYSIS, ROUTINE W REFLEX MICROSCOPIC
Glucose, UA: NEGATIVE mg/dL
Ketones, ur: NEGATIVE mg/dL
Leukocytes, UA: NEGATIVE
Nitrite: NEGATIVE
Protein, ur: 30 mg/dL — AB
Specific Gravity, Urine: >1.03 — ABNORMAL HIGH (ref 1.005–1.030)
Urobilinogen, UA: 0.2 mg/dL (ref 0.0–1.0)
pH: 5.5 (ref 5.0–8.0)

## 2012-06-27 LAB — LACTIC ACID, PLASMA: Lactic Acid, Venous: 3.2 mmol/L — ABNORMAL HIGH (ref 0.5–2.2)

## 2012-06-27 LAB — LIPASE, BLOOD: Lipase: 30 U/L (ref 11–59)

## 2012-06-27 LAB — PROTIME-INR
INR: 1.76 — ABNORMAL HIGH (ref 0.00–1.49)
Prothrombin Time: 19.9 s — ABNORMAL HIGH (ref 11.6–15.2)

## 2012-06-27 LAB — AMMONIA: Ammonia: 34 umol/L (ref 11–60)

## 2012-06-27 MED ORDER — ONDANSETRON HCL 4 MG PO TABS: 4.0000 mg | ORAL_TABLET | Freq: Four times a day (QID) | ORAL | Status: DC | PRN

## 2012-06-27 MED ORDER — INSULIN ASPART 100 UNIT/ML ~~LOC~~ SOLN: 0.0000 [IU] | Freq: Every day | SUBCUTANEOUS | Status: DC

## 2012-06-27 MED ORDER — SERTRALINE HCL 50 MG PO TABS
50.0000 mg | ORAL_TABLET | Freq: Every day | ORAL | Status: DC
  Administered 2012-06-28 – 2012-07-06 (×9): 50 mg via ORAL
  Filled 2012-06-27 (×10): qty 1

## 2012-06-27 MED ORDER — ALPRAZOLAM 0.5 MG PO TABS
0.5000 mg | ORAL_TABLET | Freq: Three times a day (TID) | ORAL | Status: DC | PRN
  Administered 2012-06-27 – 2012-07-05 (×7): 0.5 mg via ORAL
  Filled 2012-06-27 (×8): qty 1

## 2012-06-27 MED ORDER — DEXTROSE 5 % IV SOLN
1.0000 g | INTRAVENOUS | Status: DC
  Administered 2012-06-28 – 2012-07-03 (×6): 1 g via INTRAVENOUS
  Filled 2012-06-27 (×8): qty 10

## 2012-06-27 MED ORDER — ALBUTEROL SULFATE (5 MG/ML) 0.5% IN NEBU
2.5000 mg | INHALATION_SOLUTION | Freq: Four times a day (QID) | RESPIRATORY_TRACT | Status: DC
  Administered 2012-06-28 – 2012-07-02 (×16): 2.5 mg via RESPIRATORY_TRACT
  Filled 2012-06-27 (×16): qty 0.5

## 2012-06-27 MED ORDER — ALBUTEROL SULFATE (5 MG/ML) 0.5% IN NEBU: 2.5000 mg | INHALATION_SOLUTION | RESPIRATORY_TRACT | Status: DC | PRN

## 2012-06-27 MED ORDER — ACETAMINOPHEN 325 MG PO TABS
650.0000 mg | ORAL_TABLET | Freq: Four times a day (QID) | ORAL | Status: DC | PRN
  Administered 2012-06-28 – 2012-07-02 (×2): 650 mg via ORAL
  Filled 2012-06-27: qty 2
  Filled 2012-06-27: qty 1

## 2012-06-27 MED ORDER — ASPIRIN EC 81 MG PO TBEC
81.0000 mg | DELAYED_RELEASE_TABLET | Freq: Every day | ORAL | Status: DC
  Administered 2012-06-28 – 2012-07-06 (×9): 81 mg via ORAL
  Filled 2012-06-27 (×9): qty 1

## 2012-06-27 MED ORDER — ONDANSETRON HCL 4 MG/2ML IJ SOLN: 4.0000 mg | Freq: Four times a day (QID) | INTRAMUSCULAR | Status: DC | PRN

## 2012-06-27 MED ORDER — PREDNISONE 10 MG PO TABS
5.0000 mg | ORAL_TABLET | Freq: Every day | ORAL | Status: DC
  Administered 2012-06-28: 5 mg via ORAL
  Filled 2012-06-27 (×2): qty 1

## 2012-06-27 MED ORDER — SODIUM CHLORIDE 0.9 % IV SOLN
INTRAVENOUS | Status: DC
  Administered 2012-06-27: via INTRAVENOUS

## 2012-06-27 MED ORDER — RIVAROXABAN 10 MG PO TABS
20.0000 mg | ORAL_TABLET | Freq: Every day | ORAL | Status: DC
  Administered 2012-06-28 – 2012-07-06 (×9): 20 mg via ORAL
  Filled 2012-06-27 (×9): qty 2

## 2012-06-27 MED ORDER — SODIUM CHLORIDE 0.9 % IJ SOLN
3.0000 mL | Freq: Two times a day (BID) | INTRAMUSCULAR | Status: DC
  Administered 2012-06-28: 3 mL via INTRAVENOUS

## 2012-06-27 MED ORDER — MOMETASONE FURO-FORMOTEROL FUM 100-5 MCG/ACT IN AERO
2.0000 | INHALATION_SPRAY | Freq: Two times a day (BID) | RESPIRATORY_TRACT | Status: DC
  Administered 2012-06-28 – 2012-07-06 (×17): 2 via RESPIRATORY_TRACT
  Filled 2012-06-27: qty 8.8

## 2012-06-27 MED ORDER — SODIUM CHLORIDE 0.9 % IV BOLUS (SEPSIS)
1000.0000 mL | Freq: Once | INTRAVENOUS | Status: AC
  Administered 2012-06-27: 1000 mL via INTRAVENOUS

## 2012-06-27 MED ORDER — ACETAMINOPHEN 650 MG RE SUPP: 650.0000 mg | Freq: Four times a day (QID) | RECTAL | Status: DC | PRN

## 2012-06-27 MED ORDER — DEXTROSE 5 % IV SOLN
1.0000 g | Freq: Once | INTRAVENOUS | Status: AC
  Administered 2012-06-27: 1 g via INTRAVENOUS
  Filled 2012-06-27: qty 10

## 2012-06-27 MED ORDER — SODIUM CHLORIDE 0.9 % IV BOLUS (SEPSIS): 1000.0000 mL | Freq: Once | INTRAVENOUS | Status: DC

## 2012-06-27 MED ORDER — LEVOTHYROXINE SODIUM 100 MCG PO TABS
200.0000 ug | ORAL_TABLET | Freq: Every day | ORAL | Status: DC
  Administered 2012-06-28 – 2012-07-06 (×9): 200 ug via ORAL
  Filled 2012-06-27 (×8): qty 2

## 2012-06-27 MED ORDER — PANTOPRAZOLE SODIUM 40 MG PO TBEC
40.0000 mg | DELAYED_RELEASE_TABLET | Freq: Every day | ORAL | Status: DC
  Administered 2012-06-28 – 2012-07-06 (×9): 40 mg via ORAL
  Filled 2012-06-27 (×9): qty 1

## 2012-06-27 MED ORDER — GABAPENTIN 100 MG PO CAPS
100.0000 mg | ORAL_CAPSULE | Freq: Three times a day (TID) | ORAL | Status: DC
  Administered 2012-06-27 – 2012-07-06 (×27): 100 mg via ORAL
  Filled 2012-06-27 (×30): qty 1

## 2012-06-27 MED ORDER — IPRATROPIUM BROMIDE 0.02 % IN SOLN
0.5000 mg | Freq: Four times a day (QID) | RESPIRATORY_TRACT | Status: DC
  Administered 2012-06-28 – 2012-07-06 (×33): 0.5 mg via RESPIRATORY_TRACT
  Filled 2012-06-27 (×33): qty 2.5

## 2012-06-27 MED ORDER — INSULIN ASPART 100 UNIT/ML ~~LOC~~ SOLN
0.0000 [IU] | Freq: Three times a day (TID) | SUBCUTANEOUS | Status: DC
  Administered 2012-06-28 (×2): 2 [IU] via SUBCUTANEOUS
  Administered 2012-06-28: 3 [IU] via SUBCUTANEOUS
  Administered 2012-06-29: 2 [IU] via SUBCUTANEOUS
  Administered 2012-06-29: 3 [IU] via SUBCUTANEOUS
  Administered 2012-06-29: 2 [IU] via SUBCUTANEOUS
  Administered 2012-06-30: 5 [IU] via SUBCUTANEOUS

## 2012-06-27 MED ORDER — POTASSIUM CHLORIDE 10 MEQ/100ML IV SOLN
10.0000 meq | INTRAVENOUS | Status: DC
  Administered 2012-06-27 (×2): 10 meq via INTRAVENOUS
  Filled 2012-06-27 (×2): qty 100
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
## 2012-06-27 NOTE — ED Notes (Signed)
O2 sats at 86% when pt placed on monitor. Pt placed on 2L o2 at this time.

## 2012-06-27 NOTE — ED Notes (Signed)
Family states pts last normal bowel movement was 3 days ago and states pt has not urinated since this morning but has been drinking water. Also states pt has been talking out of her head and acts like she is sewing.

## 2012-06-27 NOTE — ED Notes (Addendum)
Critical K+ 2.3 called from lab, EDP made aware of this. RON, RN will also be made aware

## 2012-06-27 NOTE — ED Notes (Signed)
Also states intermittent abdominal pain

## 2012-06-27 NOTE — ED Notes (Signed)
Pt assisted to bedpan.  ?

## 2012-06-28 ENCOUNTER — Encounter (HOSPITAL_COMMUNITY): Payer: Self-pay | Admitting: Internal Medicine

## 2012-06-28 DIAGNOSIS — L03116 Cellulitis of left lower limb: Secondary | ICD-10-CM | POA: Diagnosis present

## 2012-06-28 DIAGNOSIS — R609 Edema, unspecified: Secondary | ICD-10-CM

## 2012-06-28 DIAGNOSIS — R600 Localized edema: Secondary | ICD-10-CM | POA: Diagnosis present

## 2012-06-28 DIAGNOSIS — J321 Chronic frontal sinusitis: Secondary | ICD-10-CM | POA: Diagnosis present

## 2012-06-28 DIAGNOSIS — E538 Deficiency of other specified B group vitamins: Secondary | ICD-10-CM | POA: Diagnosis present

## 2012-06-28 DIAGNOSIS — E876 Hypokalemia: Secondary | ICD-10-CM

## 2012-06-28 DIAGNOSIS — L02419 Cutaneous abscess of limb, unspecified: Secondary | ICD-10-CM

## 2012-06-28 HISTORY — DX: Chronic frontal sinusitis: J32.1

## 2012-06-28 HISTORY — DX: Deficiency of other specified B group vitamins: E53.8

## 2012-06-28 LAB — COMPREHENSIVE METABOLIC PANEL
ALT: 33 U/L (ref 0–35)
Albumin: 2.6 g/dL — ABNORMAL LOW (ref 3.5–5.2)
Alkaline Phosphatase: 75 U/L (ref 39–117)
GFR calc Af Amer: 22 mL/min — ABNORMAL LOW (ref >90)
Glucose, Bld: 129 mg/dL — ABNORMAL HIGH (ref 70–99)
Potassium: 2.4 mEq/L — CL (ref 3.5–5.1)
Sodium: 136 mEq/L (ref 135–145)
Total Protein: 5.8 g/dL — ABNORMAL LOW (ref 6.0–8.3)

## 2012-06-28 LAB — CBC
HCT: 26.6 % — ABNORMAL LOW (ref 36.0–46.0)
MCHC: 30.1 g/dL (ref 30.0–36.0)
MCV: 82.9 fL (ref 78.0–100.0)
RDW: 17.4 % — ABNORMAL HIGH (ref 11.5–15.5)

## 2012-06-28 LAB — TROPONIN I: Troponin I: <0.3 ng/mL (ref <0.30)

## 2012-06-28 LAB — GLUCOSE, CAPILLARY: Glucose-Capillary: 125 mg/dL — ABNORMAL HIGH (ref 70–99)

## 2012-06-28 LAB — URINALYSIS, ROUTINE W REFLEX MICROSCOPIC
Nitrite: NEGATIVE
Protein, ur: 100 mg/dL — AB

## 2012-06-28 LAB — BASIC METABOLIC PANEL
BUN: 23 mg/dL (ref 6–23)
Calcium: 7.9 mg/dL — ABNORMAL LOW (ref 8.4–10.5)
Creatinine, Ser: 2.34 mg/dL — ABNORMAL HIGH (ref 0.50–1.10)
GFR calc Af Amer: 24 mL/min — ABNORMAL LOW (ref >90)
GFR calc non Af Amer: 21 mL/min — ABNORMAL LOW (ref >90)

## 2012-06-28 LAB — URINE MICROSCOPIC-ADD ON

## 2012-06-28 LAB — MAGNESIUM: Magnesium: 1.8 mg/dL (ref 1.5–2.5)

## 2012-06-28 MED ORDER — BENZONATATE 100 MG PO CAPS
100.0000 mg | ORAL_CAPSULE | Freq: Three times a day (TID) | ORAL | Status: DC
  Administered 2012-06-28 – 2012-07-06 (×26): 100 mg via ORAL
  Filled 2012-06-28 (×26): qty 1

## 2012-06-28 MED ORDER — POLYETHYLENE GLYCOL 3350 17 G PO PACK
17.0000 g | PACK | Freq: Every day | ORAL | Status: DC
  Administered 2012-06-28 – 2012-07-05 (×7): 17 g via ORAL
  Filled 2012-06-28 (×8): qty 1

## 2012-06-28 MED ORDER — CYANOCOBALAMIN 1000 MCG/ML IJ SOLN
1000.0000 ug | Freq: Every day | INTRAMUSCULAR | Status: AC
  Administered 2012-06-28 – 2012-06-30 (×3): 1000 ug via INTRAMUSCULAR
  Filled 2012-06-28 (×3): qty 1

## 2012-06-28 MED ORDER — POTASSIUM CHLORIDE CRYS ER 20 MEQ PO TBCR
40.0000 meq | EXTENDED_RELEASE_TABLET | ORAL | Status: DC
  Administered 2012-06-28: 40 meq via ORAL
  Filled 2012-06-28: qty 2

## 2012-06-28 MED ORDER — POTASSIUM CHLORIDE CRYS ER 20 MEQ PO TBCR
40.0000 meq | EXTENDED_RELEASE_TABLET | Freq: Once | ORAL | Status: AC
  Administered 2012-06-28: 40 meq via ORAL

## 2012-06-28 MED ORDER — POTASSIUM CHLORIDE CRYS ER 20 MEQ PO TBCR
40.0000 meq | EXTENDED_RELEASE_TABLET | Freq: Three times a day (TID) | ORAL | Status: AC
  Administered 2012-06-28 (×3): 40 meq via ORAL
  Filled 2012-06-28 (×4): qty 2

## 2012-06-28 MED ORDER — VANCOMYCIN HCL 10 G IV SOLR
1500.0000 mg | Freq: Once | INTRAVENOUS | Status: AC
  Administered 2012-06-28: 1500 mg via INTRAVENOUS
  Filled 2012-06-28: qty 1500

## 2012-06-28 MED ORDER — DOCUSATE SODIUM 100 MG PO CAPS
100.0000 mg | ORAL_CAPSULE | Freq: Two times a day (BID) | ORAL | Status: DC
  Administered 2012-06-28 (×2): 100 mg via ORAL
  Filled 2012-06-28 (×2): qty 1

## 2012-06-28 MED ORDER — POTASSIUM CHLORIDE IN NACL 40-0.9 MEQ/L-% IV SOLN
INTRAVENOUS | Status: DC
  Administered 2012-06-28: 75 mL/h via INTRAVENOUS
  Administered 2012-06-29: 04:00:00 via INTRAVENOUS

## 2012-06-28 MED ORDER — VANCOMYCIN HCL 10 G IV SOLR
1250.0000 mg | INTRAVENOUS | Status: DC
  Administered 2012-06-29 – 2012-07-01 (×3): 1250 mg via INTRAVENOUS
  Filled 2012-06-28 (×4): qty 1250

## 2012-06-28 NOTE — Progress Notes (Signed)
Utilization review completed.  
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
## 2012-06-28 NOTE — Progress Notes (Addendum)
Subjective: The patient complains of a cough that is wet, but nonproductive. When asked about sinus symptoms, she states that she has nasal congestion, but no facial pain.  Objective: Vital signs in last 24 hours: Filed Vitals:   06/27/12 2322 06/28/12 0113 06/28/12 0546 06/28/12 0740  BP:   111/52   Pulse:   74   Temp:   97.8 F (36.6 C)   TempSrc:   Oral   Resp:   16   Height: 5\' 4"  (1.626 m)     Weight: 136.3 kg (300 lb 7.8 oz)     SpO2:  98% 98% 95%    Intake/Output Summary (Last 24 hours) at 06/28/12 4540 Last data filed at 06/28/12 9811  Gross per 24 hour  Intake   1405 ml  Output    500 ml  Net    905 ml    Weight change:   Physical exam: General: Morbidly obese 67 year old woman laying in bed, alert, and in no acute distress. HEENT: Cushingoid facies. No active rhinorrhea. No frontal sinus tenderness. Lungs: Faint expiratory wheezes, breathing nonlabored. Heart: S1, S2, with a soft systolic murmur. Abdomen: Morbidly obese, striate noted on skin, positive bowel sounds, nontender, nondistended. Extremities: 1+ pitting edema right lower extremity and 2+ pitting edema left lower extremity. Diffuse erythema over the left leg from the knee to the ankle. Pedal pulses barely palpable. Neurologic: She is alert. She is oriented to self, the hospital, city, abbreviated medical history, and new doctor. Her speech is clear. Cranial nerves II through XII are grossly intact.  Lab Results: Basic Metabolic Panel:  Basename 06/28/12 0222 06/27/12 1854  NA 136 135  K 2.4* 2.3*  CL 96 93*  CO2 27 28  GLUCOSE 129* 148*  BUN 24* 25*  CREATININE 2.46* 2.48*  CALCIUM 8.0* 8.7  MG -- --  PHOS -- --   Liver Function Tests:  Basename 06/28/12 0222 06/27/12 1854  AST 16 19  ALT 33 38*  ALKPHOS 75 90  BILITOT 0.4 0.6  PROT 5.8* 6.6  ALBUMIN 2.6* 3.0*    Basename 06/27/12 1854  LIPASE 30  AMYLASE --    Basename 06/27/12 2014  AMMONIA 34   CBC:  Basename 06/28/12  0222 06/27/12 1854  WBC 10.5 12.2*  NEUTROABS -- 9.6*  HGB 8.0* 9.2*  HCT 26.6* 30.8*  MCV 82.9 83.0  PLT 194 224   Cardiac Enzymes:  Basename 06/28/12 0222 06/27/12 2014  CKTOTAL -- --  CKMB -- --  CKMBINDEX -- --  TROPONINI <0.30 <0.30   BNP: No results found for this basename: PROBNP:3 in the last 72 hours D-Dimer: No results found for this basename: DDIMER:2 in the last 72 hours CBG:  Basename 06/27/12 2356  GLUCAP 106*   Hemoglobin A1C: No results found for this basename: HGBA1C in the last 72 hours Fasting Lipid Panel: No results found for this basename: CHOL,HDL,LDLCALC,TRIG,CHOLHDL,LDLDIRECT in the last 72 hours Thyroid Function Tests: No results found for this basename: TSH,T4TOTAL,FREET4,T3FREE,THYROIDAB in the last 72 hours Anemia Panel: No results found for this basename: VITAMINB12,FOLATE,FERRITIN,TIBC,IRON,RETICCTPCT in the last 72 hours Coagulation:  Basename 06/27/12 1854  LABPROT 19.9*  INR 1.76*   Urine Drug Screen: Drugs of Abuse  No results found for this basename: labopia,  cocainscrnur,  labbenz,  amphetmu,  thcu,  labbarb    Alcohol Level: No results found for this basename: ETH:2 in the last 72 hours Urinalysis:  Basename 06/27/12 1943  COLORURINE YELLOW  LABSPEC >1.030*  PHURINE 5.5  GLUCOSEU NEGATIVE  HGBUR SMALL*  BILIRUBINUR SMALL*  KETONESUR NEGATIVE  PROTEINUR 30*  UROBILINOGEN 0.2  NITRITE NEGATIVE  LEUKOCYTESUR NEGATIVE   Misc. Labs:   Micro: No results found for this or any previous visit (from the past 240 hour(s)).  Studies/Results: Dg Chest 2 View  06/27/2012  *RADIOLOGY REPORT*  Clinical Data: Chest pain.  Chest congestion.  CHEST - 2 VIEW  Comparison: 06/13/2012  Findings: The left arm PICC line has been removed since previous study.  The low lung volumes are again demonstrated.  Left lower lung atelectasis or scarring is unchanged.  Cardiomegaly stable. Right lung remains clear.  IMPRESSION: Stable cardiomegaly,  low lung volumes, and left lower lung scarring.  No acute findings.   Original Report Authenticated By: Myles Rosenthal, M.D.    Ct Head Wo Contrast  06/27/2012  *RADIOLOGY REPORT*  Clinical Data: Altered mental status.  Blood thinners.  CT HEAD WITHOUT CONTRAST  Technique:  Contiguous axial images were obtained from the base of the skull through the vertex without contrast.  Comparison: CT head without contrast 04/17/2012.  Findings: Atrophy and mild white matter disease is stable.  No acute cortical infarct, hemorrhage, mass lesion is present.  The ventricles are of normal size.  No significant extra-axial fluid collection is present.  Anterior ethmoid and frontal sinus disease is present.  Bilateral mastoid effusions are similar to the prior exam.  No obstructing nasopharyngeal lesion is evident.  IMPRESSION:  1.  Stable mild atrophy white matter disease. 2.  No acute intracranial abnormality. 3.  Mild anterior ethmoid and frontal sinus disease. 4.  Similar bilateral mastoid effusions.  No obstructing nasopharyngeal lesion is evident.   Original Report Authenticated By: Marin Roberts, M.D.    Dg Abd 2 Views  06/27/2012  *RADIOLOGY REPORT*  Clinical Data: Abdominal pain and distention.  Constipation. Morbid obesity.  ABDOMEN - 2 VIEW  Comparison: None.  Findings: Exam is technically suboptimal due to morbid obesity.  No evidence of dilated bowel loops.  Residual contrast is seen within diverticula in the sigmoid colon.  No evidence of free air.  Right upper quadrant surgical clips are seen from prior cholecystectomy.  IMPRESSION:  1.  No acute findings. 2.  Sigmoid diverticulosis.   Original Report Authenticated By: Myles Rosenthal, M.D.     Medications:  Scheduled:   . albuterol  2.5 mg Nebulization Q6H  . aspirin EC  81 mg Oral Daily  . benzonatate  100 mg Oral TID  . cefTRIAXone (ROCEPHIN)  IV  1 g Intravenous Q24H  . cyanocobalamin  1,000 mcg Intramuscular Daily  . docusate sodium  100 mg Oral BID   . gabapentin  100 mg Oral TID  . insulin aspart  0-15 Units Subcutaneous TID WC  . insulin aspart  0-5 Units Subcutaneous QHS  . ipratropium  0.5 mg Nebulization Q6H  . levothyroxine  200 mcg Oral QAC breakfast  . mometasone-formoterol  2 puff Inhalation BID  . pantoprazole  40 mg Oral Daily  . polyethylene glycol  17 g Oral Daily  . potassium chloride  40 mEq Oral Once  . potassium chloride  40 mEq Oral TID  . predniSONE  5 mg Oral Q breakfast  . Rivaroxaban  20 mg Oral Daily  . sertraline  50 mg Oral Daily  . sodium chloride  3 mL Intravenous Q12H   Continuous:   . 0.9 % NaCl with KCl 40 mEq / L     YQM:[id number], acetaminophen, albuterol, ALPRAZolam, ondansetron (ZOFRAN)  IV, ondansetron  Assessment: Principal Problem:  *Acute encephalopathy Active Problems:  HYPOTHYROIDISM  OVERWEIGHT/OBESITY  HYPERTENSION  CAD  Diabetes mellitus type 2 with complications  Adrenal insufficiency  DVT (deep venous thrombosis)  ARF (acute renal failure)  UTI (lower urinary tract infection)  Chronic anticoagulation  Hypokalemia  Shortness of breath  Vitamin B12 deficiency  Sinusitis chronic, frontal  Bilateral lower extremity edema  Left leg cellulitis  Morbid obesity    1. Acute encephalopathy, likely secondary to urinary tract infection. Query contribution from multiple comorbid conditions. Her symptomatology is resolving.  Urinary tract infection. We'll continue Rocephin as ordered. Urine culture pending.  Chronic ethmoid and frontal sinusitis. Possible mastoiditis. The patient is mildly symptomatic. We'll continue Rocephin and add steroid nasal spray and as needed nasal saline.  Anemia with associated vitamin B12 deficiency. Anemia panel from 06/13/2012 revealed vitamin B12 low at 206, normal folate at 6.5, total iron mildly low at 20, normal TIBC of 351, low-normal ferritin of 42, and normal total reticulocyte count of 95. We'll start vitamin B12 injections.  Acute renal  failure, superimposed on chronic kidney disease, likely stage III. The patient's 1 year ago was normal, but obviously, her renal function has worsened. During the previous hospitalization, her creatinine ranged from 1.39 to 2.0. Her renal ultrasound a couple weeks ago revealed signs of chronic kidney disease. It is likely she has stage III chronic kidney disease secondary to a combination of hypertension and diabetes mellitus. Nephrology has evaluated the patient and made recommendations and adjustments and management.  Hypokalemia. In may be secondary to chronic diuretic therapy. Magnesium level is pending. We'll continue potassium chloride repletion.   History of left lower extremity DVT. She is anticoagulated with rivaroxaban. She was diagnosed with left lower extremity DVT per ultrasound in November 2013.  Pharmacy consultation/evaluation noted and appreciated.  Bilateral lower extremity edema with superimposed left lower extremity cellulitis. The edema is likely multifactorial. Diuretic therapy is on hold. Will discuss further with nephrology before reinitiating diuretic therapy.  Diabetes mellitus, type II, with nephropathy and probable neuropathy. Her CBGs are relatively normal now. We'll continue sliding scale NovoLog. Hemoglobin A1c pending.  COPD. She has faint wheezes on exam. We'll continue Atrovent/albuterol nebulizers and Dulera. Tessalon Perles have been added for cough.  CAD/hypertension. Currently stable. Troponin I is negative x2.  Hypothyroidism. We'll continue Synthroid. TSH pending.   Plan:   1. Check the results of the TSH and hemoglobin A1c pending. 2. Continue potassium chloride repletion. Check a magnesium level when results available. 3. Add steroid nasal spray and when necessary nasal saline. 4. Add vancomycin empirically for left lower extremity cellulitis. Will try to keep legs elevated. 5. PT/OT consultation. 6. Start vitamin B12 injections x3 days and then  monthly thereafter.     LOS: 1 day   Yasin Ducat 06/28/2012, 8:52 AM

## 2012-06-28 NOTE — Progress Notes (Signed)
CRITICAL VALUE ALERT  Critical value received:  Potassium 2.4  Date of notification: 06/28/12  Time of notification: 0320  Critical value read back:yes  Nurse who received alert:  Lawson Fiscal RN  MD notified (1st page):  Rito Ehrlich  Time of first page:  0325  MD notified (2nd page):  Time of second page:  Responding MD:  Rito Ehrlich  Time MD responded:  [phone]

## 2012-06-29 ENCOUNTER — Inpatient Hospital Stay (HOSPITAL_COMMUNITY): Payer: Medicare Other

## 2012-06-29 ENCOUNTER — Encounter (HOSPITAL_COMMUNITY): Payer: Self-pay | Admitting: Internal Medicine

## 2012-06-29 DIAGNOSIS — E118 Type 2 diabetes mellitus with unspecified complications: Secondary | ICD-10-CM

## 2012-06-29 DIAGNOSIS — R5381 Other malaise: Secondary | ICD-10-CM

## 2012-06-29 DIAGNOSIS — R5383 Other fatigue: Secondary | ICD-10-CM | POA: Diagnosis not present

## 2012-06-29 LAB — URINE CULTURE
Colony Count: NO GROWTH
Colony Count: NO GROWTH
Culture: NO GROWTH

## 2012-06-29 LAB — CBC
HCT: 26.2 % — ABNORMAL LOW (ref 36.0–46.0)
Hemoglobin: 7.7 g/dL — ABNORMAL LOW (ref 12.0–15.0)
RBC: 3.09 MIL/uL — ABNORMAL LOW (ref 3.87–5.11)

## 2012-06-29 LAB — BASIC METABOLIC PANEL
Calcium: 8 mg/dL — ABNORMAL LOW (ref 8.4–10.5)
Creatinine, Ser: 2.17 mg/dL — ABNORMAL HIGH (ref 0.50–1.10)
GFR calc non Af Amer: 22 mL/min — ABNORMAL LOW (ref >90)
Glucose, Bld: 145 mg/dL — ABNORMAL HIGH (ref 70–99)
Sodium: 143 mEq/L (ref 135–145)

## 2012-06-29 LAB — GLUCOSE, CAPILLARY
Glucose-Capillary: 140 mg/dL — ABNORMAL HIGH (ref 70–99)
Glucose-Capillary: 171 mg/dL — ABNORMAL HIGH (ref 70–99)

## 2012-06-29 LAB — BLOOD GAS, ARTERIAL
Drawn by: 23534
Patient temperature: 37
pH, Arterial: 7.284 — ABNORMAL LOW (ref 7.350–7.450)

## 2012-06-29 LAB — PTH, INTACT AND CALCIUM
Calcium, Total (PTH): 7.7 mg/dL — ABNORMAL LOW (ref 8.4–10.5)
PTH: 177 pg/mL — ABNORMAL HIGH (ref 14.0–72.0)

## 2012-06-29 LAB — TSH: TSH: 5.892 u[IU]/mL — ABNORMAL HIGH (ref 0.350–4.500)

## 2012-06-29 LAB — LACTIC ACID, PLASMA: Lactic Acid, Venous: 0.9 mmol/L (ref 0.5–2.2)

## 2012-06-29 LAB — AMMONIA: Ammonia: 15 umol/L (ref 11–60)

## 2012-06-29 MED ORDER — POLYSACCHARIDE IRON COMPLEX 150 MG PO CAPS
150.0000 mg | ORAL_CAPSULE | Freq: Two times a day (BID) | ORAL | Status: DC
  Administered 2012-06-29 (×2): 150 mg via ORAL
  Filled 2012-06-29 (×2): qty 1

## 2012-06-29 MED ORDER — FUROSEMIDE 10 MG/ML IJ SOLN
60.0000 mg | Freq: Two times a day (BID) | INTRAMUSCULAR | Status: DC
  Administered 2012-06-29 – 2012-07-03 (×9): 60 mg via INTRAVENOUS
  Filled 2012-06-29 (×9): qty 6

## 2012-06-29 MED ORDER — SENNOSIDES-DOCUSATE SODIUM 8.6-50 MG PO TABS
1.0000 | ORAL_TABLET | Freq: Two times a day (BID) | ORAL | Status: DC
  Administered 2012-06-29 – 2012-07-05 (×13): 1 via ORAL
  Filled 2012-06-29 (×14): qty 1

## 2012-06-29 MED ORDER — FLUTICASONE PROPIONATE 50 MCG/ACT NA SUSP
1.0000 | Freq: Every day | NASAL | Status: DC
  Administered 2012-06-29 – 2012-07-06 (×8): 1 via NASAL
  Filled 2012-06-29 (×2): qty 16

## 2012-06-29 MED ORDER — SALINE SPRAY 0.65 % NA SOLN
1.0000 | NASAL | Status: DC | PRN
  Administered 2012-06-29 – 2012-07-05 (×3): 1 via NASAL
  Filled 2012-06-29: qty 44

## 2012-06-29 MED ORDER — SODIUM CHLORIDE 0.9 % IV SOLN
INTRAVENOUS | Status: DC
  Administered 2012-06-29: 75 mL/h via INTRAVENOUS
  Administered 2012-06-30 – 2012-07-05 (×3): via INTRAVENOUS

## 2012-06-29 MED ORDER — POTASSIUM CHLORIDE CRYS ER 20 MEQ PO TBCR
20.0000 meq | EXTENDED_RELEASE_TABLET | Freq: Two times a day (BID) | ORAL | Status: DC
  Administered 2012-06-29 – 2012-07-04 (×12): 20 meq via ORAL
  Filled 2012-06-29 (×12): qty 1

## 2012-06-29 MED ORDER — HYDROCORTISONE SOD SUCCINATE 100 MG IJ SOLR
50.0000 mg | Freq: Two times a day (BID) | INTRAMUSCULAR | Status: AC
  Administered 2012-06-29 (×2): 50 mg via INTRAVENOUS
  Filled 2012-06-29 (×2): qty 2

## 2012-06-29 NOTE — Progress Notes (Signed)
Pt is confused 

## 2012-06-29 NOTE — Progress Notes (Addendum)
Subjective: The patient is sleeping and difficult to arouse, but is arousable. Physical therapy is in the room, but the patient appears to be too lethargic to participate. With questioning, she has no complaints of headache, chest pain, shortness of breath, or abdominal pain.  Objective: Vital signs in last 24 hours: Filed Vitals:   06/28/12 2059 06/29/12 0127 06/29/12 0649 06/29/12 0727  BP: 92/58  101/66   Pulse: 82  80   Temp: 97 F (36.1 C)  98.9 F (37.2 C)   TempSrc: Oral  Axillary   Resp: 18  20   Height:      Weight:   139 kg (306 lb 7 oz)   SpO2: 95% 95% 95% 93%    Intake/Output Summary (Last 24 hours) at 06/29/12 0901 Last data filed at 06/29/12 1610  Gross per 24 hour  Intake   2075 ml  Output    550 ml  Net   1525 ml    Weight change: -0.708 kg (-1 lb 9 oz)  Physical exam: General: Morbidly obese 67 year old woman laying in bed, less alert and more lethargic than yesterday. Otherwise no acute distress. HEENT: Cushingoid facies. No active rhinorrhea. No frontal sinus tenderness. Lungs: Faint expiratory wheezes, breathing nonlabored. Heart: S1, S2, with a soft systolic murmur. Abdomen: Morbidly obese, striate noted on skin, positive bowel sounds, nontender, nondistended. Extremities: 1+ pitting edema right lower extremity and 2+ pitting edema left lower extremity. Diffuse erythema over the left leg from the knee to the ankle, less than yesterday. Pedal pulses barely palpable. Neurologic: She is lethargic. She is oriented to herself. She opens her eyes and answers a few questions, but then falls back to sleep. Grossly, all of her cranial nerves are intact. She moves her hands and wiggles her toes bilaterally upon request.   Lab Results: Basic Metabolic Panel:  Basename 06/29/12 0528 06/28/12 1124 06/28/12 0806  NA 143 139 --  K 4.3 2.8* --  CL 110 101 --  CO2 23 25 --  GLUCOSE 145* 151* --  BUN 20 23 --  CREATININE 2.17* 2.34* --  CALCIUM 8.0* 7.9* --  MG --  -- 1.8  PHOS -- -- --   Liver Function Tests:  Basename 06/28/12 0222 06/27/12 1854  AST 16 19  ALT 33 38*  ALKPHOS 75 90  BILITOT 0.4 0.6  PROT 5.8* 6.6  ALBUMIN 2.6* 3.0*    Basename 06/27/12 1854  LIPASE 30  AMYLASE --    Basename 06/27/12 2014  AMMONIA 34   CBC:  Basename 06/29/12 0528 06/28/12 0222 06/27/12 1854  WBC 8.8 10.5 --  NEUTROABS -- -- 9.6*  HGB 7.7* 8.0* --  HCT 26.2* 26.6* --  MCV 84.8 82.9 --  PLT 194 194 --   Cardiac Enzymes:  Basename 06/28/12 0806 06/28/12 0222 06/27/12 2014  CKTOTAL -- -- --  CKMB -- -- --  CKMBINDEX -- -- --  TROPONINI <0.30 <0.30 <0.30   BNP: No results found for this basename: PROBNP:3 in the last 72 hours D-Dimer: No results found for this basename: DDIMER:2 in the last 72 hours CBG:  Basename 06/29/12 0734 06/28/12 2056 06/28/12 1633 06/28/12 1118 06/28/12 0734 06/27/12 2356  GLUCAP 140* 134* 125* 144* 122* 106*   Hemoglobin A1C:  Basename 06/27/12 2014  HGBA1C 7.5*   Fasting Lipid Panel: No results found for this basename: CHOL,HDL,LDLCALC,TRIG,CHOLHDL,LDLDIRECT in the last 72 hours Thyroid Function Tests:  Basename 06/27/12 2014  TSH 5.892*  T4TOTAL --  FREET4 1.33  T3FREE --  THYROIDAB --   Anemia Panel: No results found for this basename: VITAMINB12,FOLATE,FERRITIN,TIBC,IRON,RETICCTPCT in the last 72 hours Coagulation:  Basename 06/27/12 1854  LABPROT 19.9*  INR 1.76*   Urine Drug Screen: Drugs of Abuse  No results found for this basename: labopia,  cocainscrnur,  labbenz,  amphetmu,  thcu,  labbarb    Alcohol Level: No results found for this basename: ETH:2 in the last 72 hours Urinalysis:  Basename 06/28/12 0917 06/27/12 1943  COLORURINE YELLOW YELLOW  LABSPEC 1.025 >1.030*  PHURINE 6.0 5.5  GLUCOSEU NEGATIVE NEGATIVE  HGBUR LARGE* SMALL*  BILIRUBINUR SMALL* SMALL*  KETONESUR TRACE* NEGATIVE  PROTEINUR 100* 30*  UROBILINOGEN 0.2 0.2  NITRITE NEGATIVE NEGATIVE  LEUKOCYTESUR  SMALL* NEGATIVE   Misc. Labs:   Micro: No results found for this or any previous visit (from the past 240 hour(s)).  Studies/Results: Dg Chest 2 View  06/27/2012  *RADIOLOGY REPORT*  Clinical Data: Chest pain.  Chest congestion.  CHEST - 2 VIEW  Comparison: 06/13/2012  Findings: The left arm PICC line has been removed since previous study.  The low lung volumes are again demonstrated.  Left lower lung atelectasis or scarring is unchanged.  Cardiomegaly stable. Right lung remains clear.  IMPRESSION: Stable cardiomegaly, low lung volumes, and left lower lung scarring.  No acute findings.   Original Report Authenticated By: Myles Rosenthal, M.D.    Ct Head Wo Contrast  06/27/2012  *RADIOLOGY REPORT*  Clinical Data: Altered mental status.  Blood thinners.  CT HEAD WITHOUT CONTRAST  Technique:  Contiguous axial images were obtained from the base of the skull through the vertex without contrast.  Comparison: CT head without contrast 04/17/2012.  Findings: Atrophy and mild white matter disease is stable.  No acute cortical infarct, hemorrhage, mass lesion is present.  The ventricles are of normal size.  No significant extra-axial fluid collection is present.  Anterior ethmoid and frontal sinus disease is present.  Bilateral mastoid effusions are similar to the prior exam.  No obstructing nasopharyngeal lesion is evident.  IMPRESSION:  1.  Stable mild atrophy white matter disease. 2.  No acute intracranial abnormality. 3.  Mild anterior ethmoid and frontal sinus disease. 4.  Similar bilateral mastoid effusions.  No obstructing nasopharyngeal lesion is evident.   Original Report Authenticated By: Marin Roberts, M.D.    Dg Abd 2 Views  06/27/2012  *RADIOLOGY REPORT*  Clinical Data: Abdominal pain and distention.  Constipation. Morbid obesity.  ABDOMEN - 2 VIEW  Comparison: None.  Findings: Exam is technically suboptimal due to morbid obesity.  No evidence of dilated bowel loops.  Residual contrast is seen within  diverticula in the sigmoid colon.  No evidence of free air.  Right upper quadrant surgical clips are seen from prior cholecystectomy.  IMPRESSION:  1.  No acute findings. 2.  Sigmoid diverticulosis.   Original Report Authenticated By: Myles Rosenthal, M.D.     Medications:  Scheduled:    . albuterol  2.5 mg Nebulization Q6H  . aspirin EC  81 mg Oral Daily  . benzonatate  100 mg Oral TID  . cefTRIAXone (ROCEPHIN)  IV  1 g Intravenous Q24H  . cyanocobalamin  1,000 mcg Intramuscular Daily  . docusate sodium  100 mg Oral BID  . fluticasone  1 spray Each Nare Daily  . furosemide  60 mg Intravenous BID  . gabapentin  100 mg Oral TID  . insulin aspart  0-15 Units Subcutaneous TID WC  . insulin aspart  0-5 Units  Subcutaneous QHS  . ipratropium  0.5 mg Nebulization Q6H  . iron polysaccharides  150 mg Oral BID  . levothyroxine  200 mcg Oral QAC breakfast  . mometasone-formoterol  2 puff Inhalation BID  . pantoprazole  40 mg Oral Daily  . polyethylene glycol  17 g Oral Daily  . predniSONE  5 mg Oral Q breakfast  . Rivaroxaban  20 mg Oral Daily  . sertraline  50 mg Oral Daily  . vancomycin  1,250 mg Intravenous Q24H   Continuous:    . sodium chloride     ZOX:[id number], acetaminophen, albuterol, ALPRAZolam, ondansetron (ZOFRAN) IV, ondansetron, sodium chloride  Assessment: Principal Problem:  *Acute encephalopathy Active Problems:  HYPOTHYROIDISM  HYPERTENSION  CAD  Diabetes mellitus type 2 with complications  Adrenal insufficiency  DVT (deep venous thrombosis)  ARF (acute renal failure)  UTI (lower urinary tract infection)  Chronic anticoagulation  Hypokalemia  Shortness of breath  Vitamin B12 deficiency  Sinusitis chronic, frontal  Bilateral lower extremity edema  Left leg cellulitis  Morbid obesity  Chronic kidney disease, stage III (moderate)  Lethargy    1. Acute encephalopathy. She is more lethargic this morning. Upon review, she did not receive any sedating this  morning. Initially, her encephalopathy was thought to be  secondary to urinary tract infection. Query contribution from multiple comorbid conditions, including vitamin B12 deficiency. CT of her head reveals no acute intracranial findings.  Urinary tract infection. We'll continue Rocephin as ordered. Urine culture pending.  Chronic ethmoid and frontal sinusitis. Possible mastoiditis. The patient is mildly symptomatic. We'll continue Rocephin and add steroid nasal spray and as needed nasal saline.  Anemia with associated vitamin B12 deficiency. Anemia panel from 06/13/2012 revealed vitamin B12 low at 206, normal folate at 6.5, total iron mildly low at 20, normal TIBC of 351, low-normal ferritin of 42, and normal total reticulocyte count of 95. Her hemoglobin has decreased to less than 8 g. No obvious GI or GU bleeding. We will continue vitamin B12 injections. Iron supplementation was started per nephrology. Apparently, the patient has a GI evaluation by Dr. Russella Dar pending. This will need to be confirmed with her family.  Acute renal failure, superimposed on chronic kidney disease, likely stage III. The patient's 1 year ago was normal, but obviously, her renal function has worsened. During the previous hospitalization, her creatinine ranged from 1.39 to 2.0. Her renal ultrasound a couple weeks ago revealed signs of chronic kidney disease. It is likely she has stage III chronic kidney disease secondary to a combination of hypertension and diabetes mellitus. Dr. Kristian Covey has made adjustments in her IV fluids and started Lasix.  Hypokalemia. Resolved following potassium chloride supplementation. Magnesium level is within normal limits. Hypokalemia may be secondary to chronic diuretic therapy.   History of left lower extremity DVT. She is anticoagulated with rivaroxaban. She was diagnosed with left lower extremity DVT per ultrasound in November 2013.  Pharmacy consultation/evaluation noted and  appreciated.  Bilateral lower extremity edema with superimposed left lower extremity cellulitis. The edema is likely multifactorial. Diuretic therapy was started by Dr. Kristian Covey this morning. Will continue vancomycin as ordered.  Diabetes mellitus, type II, with nephropathy and probable neuropathy. Her CBGs are relatively normal now. We'll continue sliding scale NovoLog. Hemoglobin A1c is 7.5.  COPD. She has faint wheezes on exam. We'll continue Atrovent/albuterol nebulizers and Dulera. Tessalon Perles have been added for cough.  CAD/hypertension. Currently stable. Troponin I is negative x2.  Hypothyroidism. We'll continue Synthroid. TSH is mildly elevated, but  her free T4 is within normal limits. Will maintain Synthroid at the same dose.   Plan:  1. We'll order an ABG and ammonia level for evaluation of lethargy. 2. Iron supplementation and Lasix added per nephrology. 3. We'll continue potassium chloride repletion in the setting of Lasix. 4. Hemoccult stools. 5. We'll give a couple stress doses of IV steroids.    LOS: 2 days   Ann [name] 06/29/2012, 9:01 AM

## 2012-06-29 NOTE — Progress Notes (Signed)
OT Cancellation Note  Patient Details Name: Ann [name] MRN: [id number] DOB: 1945-07-15   Cancelled Treatment:    Reason Eval/Treat Not Completed: Patient's level of consciousness. Made several attempts to arouse patient. Patient unable to remain awake to perform eval. Will re-attempt OT eval when appropriate.  Limmie Patricia, OTR/L 06/29/2012, 8:56 AM

## 2012-06-29 NOTE — Progress Notes (Signed)
Subjective: Interval History: has no complaint of nausea vomiting. Patient complaints of weakness. She denies any difficulty in breathing..  Objective: Vital signs in last 24 hours: Temp:  [97 F (36.1 C)-98.9 F (37.2 C)] 98.9 F (37.2 C) (02/03 0649) Pulse Rate:  [76-82] 80  (02/03 0649) Resp:  [18-20] 20  (02/03 0649) BP: (90-101)/(49-74) 101/66 mmHg (02/03 0649) SpO2:  [93 %-95 %] 93 % (02/03 0727) Weight:  [139 kg (306 lb 7 oz)] 139 kg (306 lb 7 oz) (02/03 0649) Weight change: -0.708 kg (-1 lb 9 oz)  Intake/Output from previous day: 02/02 0701 - 02/03 0700 In: 2435 [P.O.:840; I.V.:1545; IV Piggyback:50] Out: 600 [Urine:600] Intake/Output this shift:   The patient somnolent but arousable. Chest decreased breath sound bilaterally she has some expiratory wheezing and inspiratory rhonchi. Heart exam revealed regular rate and rhythm distant S1-S2. Abdomen :full, none tender and positive bowel sound. Extremities she has 2+ edema bilaterally.  Lab Results:  Basename 06/29/12 0528 06/28/12 0222  WBC 8.8 10.5  HGB 7.7* 8.0*  HCT 26.2* 26.6*  PLT 194 194   BMET:  Basename 06/29/12 0528 06/28/12 1124  NA 143 139  K 4.3 2.8*  CL 110 101  CO2 23 25  GLUCOSE 145* 151*  BUN 20 23  CREATININE 2.17* 2.34*  CALCIUM 8.0* 7.9*   No results found for this basename: PTH:2 in the last 72 hours Iron Studies: No results found for this basename: IRON,TIBC,TRANSFERRIN,FERRITIN in the last 72 hours  Studies/Results: Dg Chest 2 View  06/27/2012  *RADIOLOGY REPORT*  Clinical Data: Chest pain.  Chest congestion.  CHEST - 2 VIEW  Comparison: 06/13/2012  Findings: The left arm PICC line has been removed since previous study.  The low lung volumes are again demonstrated.  Left lower lung atelectasis or scarring is unchanged.  Cardiomegaly stable. Right lung remains clear.  IMPRESSION: Stable cardiomegaly, low lung volumes, and left lower lung scarring.  No acute findings.   Original Report  Authenticated By: Myles Rosenthal, M.D.    Ct Head Wo Contrast  06/27/2012  *RADIOLOGY REPORT*  Clinical Data: Altered mental status.  Blood thinners.  CT HEAD WITHOUT CONTRAST  Technique:  Contiguous axial images were obtained from the base of the skull through the vertex without contrast.  Comparison: CT head without contrast 04/17/2012.  Findings: Atrophy and mild white matter disease is stable.  No acute cortical infarct, hemorrhage, mass lesion is present.  The ventricles are of normal size.  No significant extra-axial fluid collection is present.  Anterior ethmoid and frontal sinus disease is present.  Bilateral mastoid effusions are similar to the prior exam.  No obstructing nasopharyngeal lesion is evident.  IMPRESSION:  1.  Stable mild atrophy white matter disease. 2.  No acute intracranial abnormality. 3.  Mild anterior ethmoid and frontal sinus disease. 4.  Similar bilateral mastoid effusions.  No obstructing nasopharyngeal lesion is evident.   Original Report Authenticated By: Marin Roberts, M.D.    Dg Abd 2 Views  06/27/2012  *RADIOLOGY REPORT*  Clinical Data: Abdominal pain and distention.  Constipation. Morbid obesity.  ABDOMEN - 2 VIEW  Comparison: None.  Findings: Exam is technically suboptimal due to morbid obesity.  No evidence of dilated bowel loops.  Residual contrast is seen within diverticula in the sigmoid colon.  No evidence of free air.  Right upper quadrant surgical clips are seen from prior cholecystectomy.  IMPRESSION:  1.  No acute findings. 2.  Sigmoid diverticulosis.   Original Report Authenticated By:  Myles Rosenthal, M.D.     I have reviewed the patient's current medications.  Assessment/Plan: Problem #1 renal failure most likely acute on chronic her BUN and creatinine is slightly better. Presently patient is none oliguric. Problem #2 hypertension blood pressure seems reasonably controlled Problem #3 hypothyroidism Problem #4 diabetes Problem #5 hypokalemia patient on  potassium supplement and her potassium is normal. Problem #6 COPD Problem #7 DVT Problem #8 anemia iron deficiency. Hemoglobin and hematocrit seems to be declining. Problem #9 morbid obesity Plan: Change IV fluid to normal saline at 75 cc per hour Will start patient on Lasix 60 mg IV twice a day  Nu-Iron 150 mg by mouth twice a day Basic metabolic panel and CBC in the morning.   LOS: 2 days   Alexya Mcdaris S 06/29/2012,8:02 AM

## 2012-06-29 NOTE — Care Management Note (Signed)
    Page 1 of 2   07/06/2012     4:30:29 PM   CARE MANAGEMENT NOTE 07/06/2012  Patient:  Ann [name], Ann [name]   Account Number:  [id number]  Date Initiated:  06/28/2012  Documentation initiated by:  [facility]  Subjective/Objective Assessment:   67 year old female admitted with acute encephalopathy d/t UTI.     Action/Plan:   Lived at home PTA. PT/OT consults ordered to help determine d/c needs.   Anticipated DC Date:  07/06/2012   Anticipated DC Plan:  HOME W HOME HEALTH SERVICES      DC Planning Services  CM consult      Choice offered to / List presented to:     DME arranged  HOSPITAL BED  NEBULIZER MACHINE      DME agency  [facility]     [facility] arranged  HH-3 OT  HH-2 PT  HH-1 RN      [facility] agency  [facility]   Status of service:  Completed, signed off Medicare Important Message given?  YES (If response is "NO", the following Medicare IM given date fields will be blank) Date Medicare IM given:  07/01/2012 Date Additional Medicare IM given:  07/06/2012  Discharge Disposition:    Per UR Regulation:  Reviewed for med. necessity/level of care/duration of stay  If discussed at Long Length of Stay Meetings, dates discussed:   07/02/2012    Comments:  07/06/12 Rosemary Holms RN BSN CM All DME to be delivered today. HH set up and pt discharged with spouse. Per Mr Dahan, Dr Margo Aye arranging for O2 at night through [facility].   07/03/12 Rajendra Spiller Leanord Hawking RN BSN CM Spoke with Mr. Fretwell regarding the status of DME. He was informed that the day or the day before discharge pt's O2 sats will need to be tested. When she is to [facility], all equipment bed, nebulizer and O2 concentrator will be delivered at one time. Mr. Mode stated he understood.  07/02/12 Rosemary Holms RN BSN CM Spoke to pt. Advised that hospital bed, nebulizer machine have been ordered. O2 will most likely be needed. pt agreed all will come from [facility]. Anticipating DC tomorrow. 1430 Pt transfered to step  down unit due to AF with AVR  07/01/12 Rosemary Holms RN BSN CM 1430 Spoke at length to husband who was upset that someone gave a family member the impression that Mrs Friend may need to got to a nursing home. Assured Mr. Bascomb and pt's son, that we were completely aware of his wishes to take his wife home, [facility] already alerted that pt may be DC tomorrow. After further discussion, pt would benefit from a hospital bed in the home due to head of bed elevated and inability to reposition herself.  07/01/12 Rosemary Holms RN BSN CM [facility] active and will resume HH when DC'd.  06/29/12 Paula Zietz Leanord Hawking RN BSN CM Spoke with granddaughter at bedside. Lives with her spouse, child and spouse. PCP is [name] MD. States they recently began process for getting Home O2. States she has an aide 3x week. Unsure which agency.

## 2012-06-29 NOTE — Progress Notes (Signed)
PT Cancellation Note  Patient Details Name: Ann [name] MRN: [id number] DOB: [birth year]   Cancelled Treatment:    Reason Eval/Treat Not Completed: Patient's level of consciousness Pt is only minimally arousable.  Not appropriate for PT yet.  Will try again tomorrow.  Myrlene Broker L 06/29/2012, 9:34 AM

## 2012-06-30 ENCOUNTER — Other Ambulatory Visit (HOSPITAL_COMMUNITY): Payer: Medicare Other

## 2012-06-30 ENCOUNTER — Encounter (HOSPITAL_COMMUNITY): Payer: Self-pay | Admitting: Internal Medicine

## 2012-06-30 DIAGNOSIS — E2749 Other adrenocortical insufficiency: Secondary | ICD-10-CM

## 2012-06-30 LAB — CBC
MCH: 24.4 pg — ABNORMAL LOW (ref 26.0–34.0)
MCHC: 28.6 g/dL — ABNORMAL LOW (ref 30.0–36.0)
MCV: 85.4 fL (ref 78.0–100.0)
Platelets: 207 10*3/uL (ref 150–400)
RDW: 18.3 % — ABNORMAL HIGH (ref 11.5–15.5)

## 2012-06-30 LAB — GLUCOSE, CAPILLARY
Glucose-Capillary: 234 mg/dL — ABNORMAL HIGH (ref 70–99)
Glucose-Capillary: 298 mg/dL — ABNORMAL HIGH (ref 70–99)
Glucose-Capillary: 247 mg/dL — ABNORMAL HIGH (ref 70–99)
Glucose-Capillary: 265 mg/dL — ABNORMAL HIGH (ref 70–99)

## 2012-06-30 LAB — BASIC METABOLIC PANEL
BUN: 15 mg/dL (ref 6–23)
CO2: 26 mEq/L (ref 19–32)
Calcium: 8 mg/dL — ABNORMAL LOW (ref 8.4–10.5)
Creatinine, Ser: 1.62 mg/dL — ABNORMAL HIGH (ref 0.50–1.10)
Glucose, Bld: 248 mg/dL — ABNORMAL HIGH (ref 70–99)

## 2012-06-30 MED ORDER — INSULIN ASPART 100 UNIT/ML ~~LOC~~ SOLN
0.0000 [IU] | Freq: Three times a day (TID) | SUBCUTANEOUS | Status: DC
  Administered 2012-06-30: 4 [IU] via SUBCUTANEOUS
  Administered 2012-06-30: 7 [IU] via SUBCUTANEOUS
  Administered 2012-07-01: 11 [IU] via SUBCUTANEOUS
  Administered 2012-07-01: 15 [IU] via SUBCUTANEOUS
  Administered 2012-07-01: 4 [IU] via SUBCUTANEOUS
  Administered 2012-07-02 – 2012-07-03 (×4): 11 [IU] via SUBCUTANEOUS
  Administered 2012-07-03: 15 [IU] via SUBCUTANEOUS
  Administered 2012-07-03: 20 [IU] via SUBCUTANEOUS
  Administered 2012-07-04: 11 [IU] via SUBCUTANEOUS
  Administered 2012-07-04: 20 [IU] via SUBCUTANEOUS

## 2012-06-30 MED ORDER — SODIUM CHLORIDE 0.9 % IV SOLN
125.0000 mg | Freq: Once | INTRAVENOUS | Status: AC
  Administered 2012-06-30: 125 mg via INTRAVENOUS
  Filled 2012-06-30: qty 10

## 2012-06-30 MED ORDER — CALCITRIOL 0.25 MCG PO CAPS
0.2500 ug | ORAL_CAPSULE | Freq: Every day | ORAL | Status: DC
  Administered 2012-06-30 – 2012-07-06 (×7): 0.25 ug via ORAL
  Filled 2012-06-30 (×7): qty 1

## 2012-06-30 MED ORDER — PREDNISONE 10 MG PO TABS
10.0000 mg | ORAL_TABLET | Freq: Every day | ORAL | Status: DC
  Administered 2012-06-30 – 2012-07-02 (×3): 10 mg via ORAL
  Filled 2012-06-30 (×3): qty 1

## 2012-06-30 MED ORDER — VITAMIN B-12 1000 MCG PO TABS
1000.0000 ug | ORAL_TABLET | Freq: Every day | ORAL | Status: DC
  Administered 2012-07-01 – 2012-07-06 (×6): 1000 ug via ORAL
  Filled 2012-06-30 (×6): qty 1

## 2012-06-30 MED ORDER — INSULIN ASPART 100 UNIT/ML ~~LOC~~ SOLN
0.0000 [IU] | Freq: Every day | SUBCUTANEOUS | Status: DC
  Administered 2012-06-30: 3 [IU] via SUBCUTANEOUS
  Administered 2012-07-02: 4 [IU] via SUBCUTANEOUS
  Administered 2012-07-03: 5 [IU] via SUBCUTANEOUS

## 2012-06-30 MED ORDER — INSULIN GLARGINE 100 UNIT/ML ~~LOC~~ SOLN
30.0000 [IU] | Freq: Every day | SUBCUTANEOUS | Status: DC
  Administered 2012-06-30: 30 [IU] via SUBCUTANEOUS

## 2012-06-30 NOTE — Evaluation (Signed)
Physical Therapy Evaluation Patient Details Name: Ann [name] MRN: [id number] DOB: 1946-02-26 Today's Date: 06/30/2012 Time: 4098-1191 PT Time Calculation (min): 31 min  PT Assessment / Plan / Recommendation Clinical Impression  Pt has hx of chronic COPD who seems to be close to prior functional status according to husband.  Pt states she already has advance HH coming out to the house working with her and husband states he would prefer for pt to go home with [facility] services rather than a SNF.  Pt will be seen in acute setting to attempt to improve pt safety.    PT Assessment  Patient needs continued PT services    Follow Up Recommendations  Home health PT    Does the patient have the potential to tolerate intense rehabilitation    no  Barriers to Discharge None      Equipment Recommendations  None recommended by PT    Recommendations for Other Services   none  Frequency Min 3X/week    Precautions / Restrictions Precautions Precautions: Fall Precaution Comments: Pt panics when she becomes SOB when ambulating and will not wait for the chair. Restrictions Weight Bearing Restrictions: No        Mobility  Bed Mobility Bed Mobility: Supine to Sit;Sitting - Scoot to Edge of Bed Supine to Sit: With rails;3: Mod assist Sitting - Scoot to Edge of Bed: 4: Min assist Sit to Supine: Not Tested (comment) Scooting to [facility]: Not tested (comment) Transfers Transfers: Sit to Stand Sit to Stand: 3: Mod assist;2: Max assist (mod from bed; max assist from commode ) Ambulation/Gait Ambulation/Gait Assistance: 4: Min guard Ambulation Distance (Feet): 12 Feet Assistive device: Rolling walker Ambulation/Gait Assistance Details: Pt able to ambulate 12 feet to bathroom withour panicing but was SOB;  upon return to chair pt began to panic and needed to have chair brought to her ambulated only 6 feet.  Pt panic due to SOB not due to weakness  Gait Pattern: Decreased step length - right;Decreased  step length - left;Trunk flexed        PT Diagnosis: Difficulty walking;Generalized weakness  PT Problem List: Decreased activity tolerance;Decreased mobility;Decreased safety awareness;Obesity;Cardiopulmonary status limiting activity PT Treatment Interventions: Gait training;Therapeutic exercise   PT Goals Acute Rehab PT Goals PT Goal Formulation: With patient Time For Goal Achievement: 07/03/12 Potential to Achieve Goals: Good Pt will go Supine/Side to Sit: with min assist PT Goal: Supine/Side to Sit - Progress: Goal set today Pt will go Sit to Stand: with min assist PT Goal: Sit to Stand - Progress: Goal set today Pt will go Stand to Sit: with supervision PT Goal: Stand to Sit - Progress: Goal set today Pt will Transfer Bed to Chair/Chair to Bed: with supervision PT Transfer Goal: Bed to Chair/Chair to Bed - Progress: Goal set today Pt will Ambulate: 16 - 50 feet;with supervision PT Goal: Ambulate - Progress: Goal set today  Visit Information  Last PT Received On: 06/30/12 Assistance Needed: +2    Subjective Data  Subjective: Pt states she normally only walks a little bit at home with a rolling walker has a w/c for long distances Patient Stated Goal: to go home   Prior Functioning  Home Living Lives With: Spouse;Son (daughter in law) Available Help at Discharge: Family;Personal care attendant Type of Home: House Home Access: Ramped entrance Home Layout: Two level;Able to live on main level with bedroom/bathroom Bathroom Shower/Tub: Health visitor: Standard Bathroom Accessibility: Yes How Accessible: Accessible via walker Home Adaptive Equipment:  Grab bars around toilet;Grab bars in shower;Wheelchair - powered;Walker - rolling;Shower chair with back;Bedside commode/3-in-1;Straight cane Prior Function Level of Independence: Needs assistance Needs Assistance: Bathing;Dressing;Feeding;Meal Prep;Light Housekeeping Bath: Maximal Dressing: Maximal Feeding:  Minimal Meal Prep: Total Light Housekeeping: Total Able to Take Stairs?: No Driving: No Vocation: On disability Communication Communication: No difficulties Dominant Hand: Right    Cognition  Cognition Overall Cognitive Status: Appears within functional limits for tasks assessed/performed Arousal/Alertness: Awake/alert Orientation Level: Appears intact for tasks assessed Behavior During Session: [facility] for tasks performed    Extremity/Trunk Assessment Right Upper Extremity Assessment RUE ROM/Strength/Tone: Deficits;Due to pain RUE ROM/Strength/Tone Deficits: MMT: 3/5. Pain in shoulder due to arthritis. A/ROM WFL in all ranges.  RUE Coordination: WFL - gross/fine motor Left Upper Extremity Assessment LUE ROM/Strength/Tone: Deficits LUE ROM/Strength/Tone Deficits: A/ROM WFL in all ranges MMT: 3+/5  LUE Coordination: WFL - gross/fine motor Right Lower Extremity Assessment RLE ROM/Strength/Tone: WFL for tasks assessed Left Lower Extremity Assessment LLE ROM/Strength/Tone: WFL for tasks assessed   Balance    End of Session PT - End of Session Equipment Utilized During Treatment: Gait belt Activity Tolerance: Treatment limited secondary to medical complications (Comment) (SOB) Patient left: with call bell/phone within reach;in chair;with chair alarm set;with family/visitor present  GP     [name],CINDY 06/30/2012, 9:51 AM

## 2012-06-30 NOTE — Progress Notes (Signed)
Subjective: Interval History: has no complaint of nausea or vomiting. Her appetite is still remain poor. Patient is still complains of feeling weak.  Objective: Vital signs in last 24 hours: Temp:  [97.9 F (36.6 C)-98.6 F (37 C)] 97.9 F (36.6 C) (02/04 0609) Pulse Rate:  [76-89] 76  (02/04 0609) Resp:  [18] 18  (02/04 0609) BP: (96-108)/(64-72) 96/64 mmHg (02/04 0609) SpO2:  [96 %-99 %] 98 % (02/04 0756) Weight:  [141 kg (310 lb 13.6 oz)] 141 kg (310 lb 13.6 oz) (02/04 0609) Weight change: 2 kg (4 lb 6.6 oz)  Intake/Output from previous day: 02/03 0701 - 02/04 0700 In: 2080 [P.O.:460; I.V.:1570; IV Piggyback:50] Out: 3075 [Urine:3075] Intake/Output this shift:    General appearance: alert, cooperative, no distress and mild distress Resp: diminished breath sounds anterior - bilateral Cardio: regular rate and rhythm, S1, S2 normal, no murmur, click, rub or gallop GI: soft, non-tender; bowel sounds normal; no masses,  no organomegaly Extremities: edema 2+ bilateral edema.  Lab Results:  [facility] 06/30/12 0524 06/29/12 0528  WBC 7.9 8.8  HGB 7.7* 7.7*  HCT 26.9* 26.2*  PLT 207 194   BMET:  Basename 06/30/12 0524 06/29/12 0528  NA 138 143  K 4.4 4.3  CL 105 110  CO2 26 23  GLUCOSE 248* 145*  BUN 15 20  CREATININE 1.62* 2.17*  CALCIUM 8.0* 8.0*    Basename 06/28/12 0806  PTH 177.0*   Iron Studies: No results found for this basename: IRON,TIBC,TRANSFERRIN,FERRITIN in the last 72 hours  Studies/Results: Mr Brain Wo Contrast  06/30/2012  *RADIOLOGY REPORT*  Clinical Data: Altered mental status  MRI HEAD WITHOUT CONTRAST  Technique:  Multiplanar, multiecho pulse sequences of the brain and surrounding structures were obtained according to standard protocol without intravenous contrast.  Comparison: CT head 06/27/2012  Findings: Image quality limited due to large patient size with difficulty positioning the patient in the head coil.  There is mild motion.  Negative for  acute infarct.  Mild chronic microvascular ischemic changes in the white matter.  No cortical infarct.  Brainstem is normal.  Negative for mass or edema.  No shift of the midline structures. Negative for intracranial hemorrhage.  Paranasal sinuses are clear.  Bilateral mastoid sinus effusion.  IMPRESSION: No acute intracranial abnormality.  Mild chronic microvascular ischemia in the white matter.  Bilateral mastoid sinus effusion.   Original Report Authenticated By: Janeece Riggers, M.D.     I have reviewed the patient's current medications.  Assessment/Plan: Problem #1 acute kidney injury superimposed on chronic her BUN is 15 creatinine is 1.6 to renal function is progressively improving.  Problem #2 anasarca patient is on diuretics her urine output has improved. Patient is still has significant edema. Problem #3 diabetes Problem #4 history of hypothyroidism Problem #5 coronary artery disease Problem #6 adrenal insufficiency Problem #7 metabolic bone disease calcium is stable and also her phosphorus. Her PTH is 177 his high. Problem #8 history of hypokalemia potassium has corrected Problem #9 history of DVT. Problem #10 anemia her hemoglobin and hematocrit is low but stable. Plan: We'll decrease her IV fluid to to 50 cc per hour We'll continue with Lasix We'll DC by mouth Nu-Iron as patient has significant iron deficiency anemia with iron saturation being 6% and ferritin 42. We'll start her on IV iron today. We'll use of Venofer  250 mg today for total of 1 g. Will follow her basic metabolic panel and CBC. We'll start her on Rocaltrol 0.25 mcg by mouth  once a day.   LOS: 3 days   Gertude Benito S 06/30/2012,8:16 AM
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
## 2012-06-30 NOTE — Evaluation (Signed)
Occupational Therapy Evaluation Patient Details Name: Ann [name] MRN: [id number] DOB: Nov 29, 1945 Today's Date: 06/30/2012 Time: 1478-2956 OT Time Calculation (min): 31 min  OT Assessment / Plan / Recommendation Clinical Impression  Patient is a 67 y/o female s/p Encephalopathy presenting to acute OT with decreased ADL performance and functional transfers. Patient is performing at baseline. The biggest obstacle that patient has is SOB during any activity.Patient was previously receiving home health and was getting assistance with all BADL. Recommend home health OT at D/C.    OT Assessment  All further OT needs can be met in the next venue of care    Follow Up Recommendations  Home health OT       Equipment Recommendations  None recommended by OT          Precautions / Restrictions Precautions Precautions: Fall Precaution Comments: Pt panics when she becomes SOB when ambulating and will not wait for the chair. Restrictions Weight Bearing Restrictions: No   Pertinent Vitals/Pain 1/10 stomach discomfort from gas.    ADL  Lower Body Dressing: Performed;+1 Total assistance Where Assessed - Lower Body Dressing: Supine, head of bed up Toilet Transfer: Performed;Maximal assistance Toilet Transfer Method: Stand pivot Toilet Transfer Equipment: Regular height toilet;Grab bars Toileting - Clothing Manipulation and Hygiene: Performed;+1 Total assistance Where Assessed - Toileting Clothing Manipulation and Hygiene: Standing    OT Diagnosis: Generalized weakness  OT Problem List: Decreased strength;Decreased activity tolerance OT Treatment Interventions:     OT Goals    Visit Information  Last OT Received On: 06/30/12 Assistance Needed: +2 PT/OT Co-Evaluation/Treatment: Yes    Subjective Data  Subjective: "I'm determined to do all I can for myself." Patient Stated Goal: To go to the bathroom.   Prior Functioning     Home Living Lives With: Spouse;Son (daughter in  law) Available Help at Discharge: Family;Personal care attendant Type of Home: House Home Access: Ramped entrance Home Layout: Two level;Able to live on main level with bedroom/bathroom Bathroom Shower/Tub: Health visitor: Standard Bathroom Accessibility: Yes How Accessible: Accessible via walker Home Adaptive Equipment: Grab bars around toilet;Grab bars in shower;Wheelchair - powered;Walker - rolling;Shower chair with back;Bedside commode/3-in-1;Straight cane Prior Function Level of Independence: Needs assistance Needs Assistance: Bathing;Dressing;Feeding;Meal Prep;Light Housekeeping Bath: Maximal Dressing: Maximal Feeding: Minimal Meal Prep: Total Light Housekeeping: Total Able to Take Stairs?: No Driving: No Vocation: On disability Communication Communication: No difficulties Dominant Hand: Right         Vision/Perception Vision - History Baseline Vision: Wears glasses only for reading Patient Visual Report: No change from baseline   Cognition  Cognition Overall Cognitive Status: Appears within functional limits for tasks assessed/performed Arousal/Alertness: Awake/alert Orientation Level: Appears intact for tasks assessed Behavior During Session: [facility] for tasks performed    Extremity/Trunk Assessment Right Upper Extremity Assessment RUE ROM/Strength/Tone: Deficits;Due to pain RUE ROM/Strength/Tone Deficits: MMT: 3/5. Pain in shoulder due to arthritis. A/ROM WFL in all ranges.  RUE Coordination: WFL - gross/fine motor Left Upper Extremity Assessment LUE ROM/Strength/Tone: Deficits LUE ROM/Strength/Tone Deficits: A/ROM WFL in all ranges MMT: 3+/5  LUE Coordination: WFL - gross/fine motor     Mobility Bed Mobility Bed Mobility: Supine to Sit;Sitting - Scoot to Edge of Bed Supine to Sit: With rails;3: Mod assist Sitting - Scoot to Edge of Bed: 4: Min assist Sit to Supine: Not Tested (comment) Scooting to [facility]: Not tested (comment) Transfers Sit  to Stand: 3: Mod assist;2: Max assist (mod from bed; max assist from commode )  End of Session OT - End of Session Equipment Utilized During Treatment: Gait belt;Other (comment) (rolling walker) Activity Tolerance: Patient limited by fatigue;Patient tolerated treatment well Patient left: in chair;with call bell/phone within reach;with chair alarm set;with family/visitor present    Limmie Patricia, OTR/L 06/30/2012, 10:16 AM

## 2012-06-30 NOTE — Progress Notes (Addendum)
Subjective: The patient is much more alert this morning. She is sitting up in a chair, but wants to get back in bed. Family was in the room earlier, but now they have left.  Objective: Vital signs in last 24 hours: Filed Vitals:   06/29/12 2059 06/29/12 2116 06/30/12 0609 06/30/12 0756  BP:  108/72 96/64   Pulse:  86 76   Temp:  98.5 F (36.9 C) 97.9 F (36.6 C)   TempSrc:  Oral Oral   Resp:  18 18   Height:      Weight:   141 kg (310 lb 13.6 oz)   SpO2: 97% 99% 97% 98%    Intake/Output Summary (Last 24 hours) at 06/30/12 1135 Last data filed at 06/30/12 0620  Gross per 24 hour  Intake   2080 ml  Output   3075 ml  Net   -995 ml    Weight change: 2 kg (4 lb 6.6 oz)  Physical exam: General: Morbidly obese 67 year old woman sitting up in a chair, alert, and in no acute distress. HEENT: Cushingoid facies. Lungs: Faint expiratory wheezes. Her breathing is chronically mildly labored (per family). Heart: S1, S2, with a soft systolic murmur. Abdomen: Morbidly obese, striate noted on skin, positive bowel sounds, nontender, nondistended. Extremities: 1+ pitting edema right lower extremity and 2+ pitting edema left lower extremity. Resolving, but not yet resolved, erythema over the left leg from the knee to the ankle, less than yesterday. Pedal pulses barely palpable. Neurologic: She is much more alert. She is oriented to herself, hospital, and Pres. Cranial nerves II through XII are grossly intact. She has a fairly strong handgrip. She is able to lift each leg against gravity approximately 30. Sensation is grossly intact. Gait not assessed. . She is oriented to herself.   Lab Results: Basic Metabolic Panel:  Basename 06/30/12 0524 06/29/12 0528 06/28/12 0806  NA 138 143 --  K 4.4 4.3 --  CL 105 110 --  CO2 26 23 --  GLUCOSE 248* 145* --  BUN 15 20 --  CREATININE 1.62* 2.17* --  CALCIUM 8.0* 8.0* --  MG -- -- 1.8  PHOS -- -- --   Liver Function Tests:  Basename 06/28/12  0222 06/27/12 1854  AST 16 19  ALT 33 38*  ALKPHOS 75 90  BILITOT 0.4 0.6  PROT 5.8* 6.6  ALBUMIN 2.6* 3.0*    Basename 06/27/12 1854  LIPASE 30  AMYLASE --    Basename 06/29/12 0934 06/27/12 2014  AMMONIA 15 34   CBC:  Basename 06/30/12 0524 06/29/12 0528 06/27/12 1854  WBC 7.9 8.8 --  NEUTROABS -- -- 9.6*  HGB 7.7* 7.7* --  HCT 26.9* 26.2* --  MCV 85.4 84.8 --  PLT 207 194 --   Cardiac Enzymes:  Basename 06/28/12 0806 06/28/12 0222 06/27/12 2014  CKTOTAL -- -- --  CKMB -- -- --  CKMBINDEX -- -- --  TROPONINI <0.30 <0.30 <0.30   BNP: No results found for this basename: PROBNP:3 in the last 72 hours D-Dimer: No results found for this basename: DDIMER:2 in the last 72 hours CBG:  Basename 06/30/12 1116 06/30/12 0756 06/29/12 2114 06/29/12 1620 06/29/12 1205 06/29/12 0734  GLUCAP 298* 234* 189* 171* 136* 140*   Hemoglobin A1C:  Basename 06/27/12 2014  HGBA1C 7.5*   Fasting Lipid Panel: No results found for this basename: CHOL,HDL,LDLCALC,TRIG,CHOLHDL,LDLDIRECT in the last 72 hours Thyroid Function Tests:  Basename 06/27/12 2014  TSH 5.892*  T4TOTAL --  FREET4 1.33  T3FREE --  THYROIDAB --   Anemia Panel: No results found for this basename: VITAMINB12,FOLATE,FERRITIN,TIBC,IRON,RETICCTPCT in the last 72 hours Coagulation:  Basename 06/27/12 1854  LABPROT 19.9*  INR 1.76*   Urine Drug Screen: Drugs of Abuse  No results found for this basename: labopia,  cocainscrnur,  labbenz,  amphetmu,  thcu,  labbarb    Alcohol Level: No results found for this basename: ETH:2 in the last 72 hours Urinalysis:  Basename 06/28/12 0917 06/27/12 1943  COLORURINE YELLOW YELLOW  LABSPEC 1.025 >1.030*  PHURINE 6.0 5.5  GLUCOSEU NEGATIVE NEGATIVE  HGBUR LARGE* SMALL*  BILIRUBINUR SMALL* SMALL*  KETONESUR TRACE* NEGATIVE  PROTEINUR 100* 30*  UROBILINOGEN 0.2 0.2  NITRITE NEGATIVE NEGATIVE  LEUKOCYTESUR SMALL* NEGATIVE   Misc. Labs:   Micro: Recent  Results (from the past 240 hour(s))  URINE CULTURE     Status: Normal   Collection Time   06/27/12  7:43 PM      Component Value Range Status Comment   Specimen Description URINE, CATHETERIZED   Final    Special Requests NONE   Final    Culture  Setup Time 06/28/2012 20:42   Final    Colony Count NO GROWTH   Final    Culture NO GROWTH   Final    Report Status 06/29/2012 FINAL   Final   URINE CULTURE     Status: Normal   Collection Time   06/28/12  9:17 AM      Component Value Range Status Comment   Specimen Description URINE, CLEAN CATCH   Final    Special Requests NONE   Final    Culture  Setup Time 06/28/2012 20:42   Final    Colony Count NO GROWTH   Final    Culture NO GROWTH   Final    Report Status 06/29/2012 FINAL   Final     Studies/Results: Mr Brain Wo Contrast  06/30/2012  *RADIOLOGY REPORT*  Clinical Data: Altered mental status  MRI HEAD WITHOUT CONTRAST  Technique:  Multiplanar, multiecho pulse sequences of the brain and surrounding structures were obtained according to standard protocol without intravenous contrast.  Comparison: CT head 06/27/2012  Findings: Image quality limited due to large patient size with difficulty positioning the patient in the head coil.  There is mild motion.  Negative for acute infarct.  Mild chronic microvascular ischemic changes in the white matter.  No cortical infarct.  Brainstem is normal.  Negative for mass or edema.  No shift of the midline structures. Negative for intracranial hemorrhage.  Paranasal sinuses are clear.  Bilateral mastoid sinus effusion.  IMPRESSION: No acute intracranial abnormality.  Mild chronic microvascular ischemia in the white matter.  Bilateral mastoid sinus effusion.   Original Report Authenticated By: Janeece Riggers, M.D.     Medications:  Scheduled:    . albuterol  2.5 mg Nebulization Q6H  . aspirin EC  81 mg Oral Daily  . benzonatate  100 mg Oral TID  . calcitRIOL  0.25 mcg Oral Daily  . cefTRIAXone (ROCEPHIN)  IV   1 g Intravenous Q24H  . cyanocobalamin  1,000 mcg Intramuscular Daily  . ferric gluconate (FERRLECIT/NULECIT) IV  125 mg Intravenous Once  . fluticasone  1 spray Each Nare Daily  . furosemide  60 mg Intravenous BID  . gabapentin  100 mg Oral TID  . insulin aspart  0-15 Units Subcutaneous TID WC  . insulin aspart  0-5 Units Subcutaneous QHS  . ipratropium  0.5 mg Nebulization Q6H  .  levothyroxine  200 mcg Oral QAC breakfast  . mometasone-formoterol  2 puff Inhalation BID  . pantoprazole  40 mg Oral Daily  . polyethylene glycol  17 g Oral Daily  . potassium chloride  20 mEq Oral BID  . predniSONE  10 mg Oral Q breakfast  . Rivaroxaban  20 mg Oral Daily  . senna-docusate  1 tablet Oral BID  . sertraline  50 mg Oral Daily  . vancomycin  1,250 mg Intravenous Q24H   Continuous:    . sodium chloride 20 mL/hr at 06/30/12 1610   RUE:[id number], acetaminophen, albuterol, ALPRAZolam, ondansetron (ZOFRAN) IV, ondansetron, sodium chloride  Assessment: Principal Problem:  *Acute encephalopathy Active Problems:  HYPOTHYROIDISM  HYPERTENSION  CAD  Diabetes mellitus type 2 with complications  Adrenal insufficiency  DVT (deep venous thrombosis)  ARF (acute renal failure)  UTI (lower urinary tract infection)  Chronic anticoagulation  Hypokalemia  Shortness of breath  Vitamin B12 deficiency  Sinusitis chronic, frontal  Bilateral lower extremity edema  Left leg cellulitis  Morbid obesity  Chronic kidney disease, stage III (moderate)  Lethargy    1. Acute encephalopathy. Dr. Gerilyn Pilgrim has evaluated her and per his assessment, the patient has toxic metabolic encephalopathy from urinary tract infection and renal failure. Additional evaluation so far has yielded a negative MRI of the brain, EEG pending, normal ammonia level, ABG without significant hypercapnia, and vitamin B12 deficiency.   Urinary tract infection. We'll continue Rocephin as ordered. Urine culture reveals no growth,  but will continue antibiotic therapy for total of 7 days.  Chronic ethmoid and frontal sinusitis. Possible mastoiditis. The patient is mildly symptomatic. We'll continue Rocephin and add steroid nasal spray and as needed nasal saline.  Anemia with associated vitamin B12 deficiency. Anemia panel from 06/13/2012 revealed vitamin B12 low at 206, normal folate at 6.5, total iron mildly low at 20, normal TIBC of 351, low-normal ferritin of 42, and normal total reticulocyte count of 95. Her hemoglobin has decreased to less than 7.7 g. No obvious GI or GU bleeding. We will continue vitamin B12 injections x3 days. Iron supplementation was started per nephrology, but it was discontinued as he is planning IV iron therapy.  Sigmoid diverticulosis and questionable rectal polyp. Per barium enema study on 06/08/2012 ordered by GI Dr. Russella Dar. Apparently, the patient was too high risk for colonoscopy per family, and Dr. Russella Dar ordered diagnostic barium study instead.  Acute renal failure, superimposed on chronic kidney disease, likely stage III. The patient's 1 year ago was normal, but obviously, her renal function has worsened. During the previous hospitalization, her creatinine ranged from 1.39 to 2.0. It was 2.48 on admission. With the assistance of Dr. Kristian Covey, IV fluids, and Lasix, her creatinine has improved to 1.6 to today. Her renal ultrasound a couple weeks ago revealed signs of chronic kidney disease. It is likely she has stage III chronic kidney disease secondary to a combination of hypertension and diabetes mellitus.  Hypokalemia. Resolved following potassium chloride supplementation. Magnesium level is within normal limits. Hypokalemia may be secondary to chronic diuretic therapy.   History of left lower extremity DVT. She is anticoagulated with rivaroxaban. She was diagnosed with left lower extremity DVT per ultrasound in November 2013.  Pharmacy consultation/evaluation noted and appreciated.  Bilateral  lower extremity edema with superimposed left lower extremity cellulitis. The edema is likely multifactorial. Left leg cellulitis is resolving. Diuretic therapy was started by Dr. Kristian Covey this morning. Will continue vancomycin as ordered.  Diabetes mellitus, type II, with nephropathy and  probable neuropathy. Her CBGs are trending upward, likely secondary to IV hydrocortisone given yesterday. We'll restart Lantus titrate Lantus and  sliding scale NovoLog. Hemoglobin A1c is 7.5.  COPD. query obstructive sleep apnea. She has faint wheezes on exam. We'll continue Atrovent/albuterol nebulizers and Dulera. Tessalon Perles have been added for cough. We'll defer outpatient sleep study to her primary care physician.  CAD/hypertension. Currently stable. Troponin I is negative x2.  Hypothyroidism. We'll continue Synthroid. TSH is mildly elevated, but her free T4 is within normal limits. Will maintain Synthroid at the same dose.   Chronic adrenal insufficiency. On chronic prednisone. The patient was given to stress doses of IV hydrocortisone yesterday. She has been restarted on prednisone today, but at a slightly higher dose. When she is stable for discharge, would decrease the dose back to 5 mg daily.  Morbid obesity and deconditioning. Home health therapy recommended by the therapists.   Plan:  1. Titrate up sliding scale NovoLog. Restart Lantus.  2. EEG cancelled by Dr. Gerilyn Pilgrim.  3. Discussed weight loss with patient and family. Will order nutrition consult. 4. Discussed the patient's clinical course with her husband, son, and daughter-in-law yesterday. I informed him that all of her chronic medical conditions could not be cured during this hospitalization, but would be addressed by her new primary care physician, Dr. Margo Aye. I emphasized that we were addressing the primary reason she was admitted. I encouraged them to avoid giving her extra food and fast foods as she needs to loose weight. 5. Likely  discharge in a couple days.      LOS: 3 days   Laren Orama 06/30/2012, 11:35 AM

## 2012-07-01 LAB — CBC
Hemoglobin: 7.9 g/dL — ABNORMAL LOW (ref 12.0–15.0)
MCH: 24.7 pg — ABNORMAL LOW (ref 26.0–34.0)
RBC: 3.2 MIL/uL — ABNORMAL LOW (ref 3.87–5.11)

## 2012-07-01 LAB — BASIC METABOLIC PANEL WITH GFR
BUN: 14 mg/dL (ref 6–23)
CO2: 28 meq/L (ref 19–32)
Calcium: 8.3 mg/dL — ABNORMAL LOW (ref 8.4–10.5)
Chloride: 105 meq/L (ref 96–112)
Creatinine, Ser: 1.59 mg/dL — ABNORMAL HIGH (ref 0.50–1.10)
GFR calc Af Amer: 38 mL/min — ABNORMAL LOW
GFR calc non Af Amer: 33 mL/min — ABNORMAL LOW
Glucose, Bld: 225 mg/dL — ABNORMAL HIGH (ref 70–99)
Potassium: 4 meq/L (ref 3.5–5.1)
Sodium: 140 meq/L (ref 135–145)

## 2012-07-01 LAB — GLUCOSE, CAPILLARY: Glucose-Capillary: 180 mg/dL — ABNORMAL HIGH (ref 70–99)

## 2012-07-01 MED ORDER — INSULIN GLARGINE 100 UNIT/ML ~~LOC~~ SOLN
35.0000 [IU] | Freq: Every day | SUBCUTANEOUS | Status: DC
  Administered 2012-07-01 – 2012-07-02 (×2): 35 [IU] via SUBCUTANEOUS

## 2012-07-01 MED ORDER — VANCOMYCIN HCL IN DEXTROSE 1-5 GM/200ML-% IV SOLN
1000.0000 mg | INTRAVENOUS | Status: DC
  Filled 2012-07-01: qty 200
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
## 2012-07-01 NOTE — Progress Notes (Signed)
Ann [name]  MRN: [id number]  DOB/AGE: 1945-06-18 67 y.o.  Primary Care Physician:HALL,ZACK, MD  Admit date: 06/27/2012  Chief Complaint:  Chief Complaint  Patient presents with  . Constipation  . Urinary Retention    [name]-Pt presented on  06/27/2012 with  Chief Complaint  Patient presents with  . Constipation  . Urinary Retention  .    Pt today feels better  Meds    . albuterol  2.5 mg Nebulization Q6H  . aspirin EC  81 mg Oral Daily  . benzonatate  100 mg Oral TID  . calcitRIOL  0.25 mcg Oral Daily  . cefTRIAXone (ROCEPHIN)  IV  1 g Intravenous Q24H  . fluticasone  1 spray Each Nare Daily  . furosemide  60 mg Intravenous BID  . gabapentin  100 mg Oral TID  . insulin aspart  0-20 Units Subcutaneous TID WC  . insulin aspart  0-5 Units Subcutaneous QHS  . insulin glargine  35 Units Subcutaneous QHS  . ipratropium  0.5 mg Nebulization Q6H  . levothyroxine  200 mcg Oral QAC breakfast  . mometasone-formoterol  2 puff Inhalation BID  . pantoprazole  40 mg Oral Daily  . polyethylene glycol  17 g Oral Daily  . potassium chloride  20 mEq Oral BID  . predniSONE  10 mg Oral Q breakfast  . Rivaroxaban  20 mg Oral Daily  . senna-docusate  1 tablet Oral BID  . sertraline  50 mg Oral Daily  . vancomycin  1,250 mg Intravenous Q24H  . vitamin B-12  1,000 mcg Oral Daily      Physical Exam: Vital signs in last 24 hours: Temp:  [97.8 F (36.6 C)-98.7 F (37.1 C)] 98 F (36.7 C) (02/05 0500) Pulse Rate:  [85-95] 85  (02/05 0500) Resp:  [18-22] 20  (02/05 0500) BP: (104-122)/(63-77) 110/77 mmHg (02/05 0500) SpO2:  [87 %-98 %] 96 % (02/05 0715) Weight change:  Last BM Date: 06/28/12  Intake/Output from previous day: 02/04 0701 - 02/05 0700 In: 1595 [P.O.:960; I.V.:379; IV Piggyback:256] Out: -      Physical Exam: General- pt is awake,alert, oriented to time place and person Resp- No acute REsp distress, CTA B/L NO Rhonchi CVS- S1S2 regular ij rate and rhythm GIT-  BS+, soft, NT, ND, Morbidly obese EXT- NO LE Edema, Cyanosis   Lab Results: CBC  Basename 07/01/12 0528 06/30/12 0524  WBC 8.4 7.9  HGB 7.9* 7.7*  HCT 27.4* 26.9*  PLT 222 207    BMET  Basename 07/01/12 0528 06/30/12 0524  NA 140 138  K 4.0 4.4  CL 105 105  CO2 28 26  GLUCOSE 225* 248*  BUN 14 15  CREATININE 1.59* 1.62*  CALCIUM 8.3* 8.0*   Creat 2014  2.48==>1.59 2013 0.8--0.97    MICRO Recent Results (from the past 240 hour([name]))  URINE CULTURE     Status: Normal   Collection Time   06/27/12  7:43 PM      Component Value Range Status Comment   Specimen Description URINE, CATHETERIZED   Final    Special Requests NONE   Final    Culture  Setup Time 06/28/2012 20:42   Final    Colony Count NO GROWTH   Final    Culture NO GROWTH   Final    Report Status 06/29/2012 FINAL   Final   URINE CULTURE     Status: Normal   Collection Time   06/28/12  9:17 AM  Component Value Range Status Comment   Specimen Description URINE, CLEAN CATCH   Final    Special Requests NONE   Final    Culture  Setup Time 06/28/2012 20:42   Final    Colony Count NO GROWTH   Final    Culture NO GROWTH   Final    Report Status 06/29/2012 FINAL   Final       Lab Results  Component Value Date   PTH 177.0* 06/28/2012   CALCIUM 8.3* 07/01/2012         Impression: 1)Renal   AKI secondary to Prerenal/ Post renal  AKI on CKD AKI now better CKD stage 2/3 . CKD secondary to Multiple AKI /HTN    2)HTN Target Organ damage  CKD CAD  Medication-  On Diuretics-Lasix  3)Anemia HGb NOT at goal (9--11) On IV Iron   4)CKD Mineral-Bone Disorder PTH elevated Secondary Hyperparathyroidism present- on calcitriol    5)COPD- Primary team following   6)FEN  Normokalemic NOrmonatremic   7)Acid base Co2 at goal     Plan:  Will continue current therapy. will follow BMet        Ann [name] 07/01/2012, 9:34 AM

## 2012-07-01 NOTE — Progress Notes (Signed)
[facility] PROGRESS NOTE  Ann [name] ZOX:[id number] DOB: [birth year] DOA: 06/27/2012 PCP: [name], MD  Assessment/Plan: 1. Acute encephalopathy. Dr. Gerilyn Pilgrim has evaluated her and per his assessment, the patient has toxic metabolic encephalopathy from urinary tract infection and renal failure. Additional evaluation so far has yielded a negative MRI of the brain, EEG pending, normal ammonia level, ABG without significant hypercapnia, and vitamin B12 deficiency. Appears at baseline today. No further work up needed per neurology.   Urinary tract infection. Rocephin day #5 of 7. Urine culture reveals no growth. Does complain of burning on urination.   Chronic ethmoid and frontal sinusitis. Possible mastoiditis. Only mildly symptomatic. We'll continue Rocephin and add steroid nasal spray and as needed nasal saline.   Anemia with associated vitamin B12 deficiency. Anemia panel from 06/13/2012 revealed vitamin B12 low at 206, normal folate at 6.5, total iron mildly low at 20, normal TIBC of 351, low-normal ferritin of 42, and normal total reticulocyte count of 95. Her hemoglobin has decreased to less than 7.7 g. No obvious GI or GU bleeding. We will continue vitamin B12 injections x3 days. Iron supplementation was started per nephrology, but it was discontinued as he is planning IV iron therapy. Stable today at 7.7.   Sigmoid diverticulosis and questionable rectal polyp. Per barium enema study on 06/08/2012 ordered by GI Dr. Russella Dar. Apparently, the patient was too high risk for colonoscopy per family, and Dr. Russella Dar ordered diagnostic barium study instead.   Acute renal failure, superimposed on chronic kidney disease, likely stage III. The patient's 1 year ago was normal, but obviously, her renal function has worsened. During the previous hospitalization, her creatinine ranged from 1.39 to 2.0. It was 2.48 on admission. Her renal ultrasound a couple weeks ago revealed signs of chronic kidney disease.  It is likely she has stage III chronic kidney disease secondary to a combination of hypertension and diabetes mellitus. Creatinine continues to trend downward today. Current level in baseline range. Follow renal recommendations.   Hypokalemia. Resolved following potassium chloride supplementation. Magnesium level is within normal limits. Hypokalemia may be secondary to chronic diuretic therapy. Remains stable.  History of left lower extremity DVT. She is anticoagulated with rivaroxaban. She was diagnosed with left lower extremity DVT per ultrasound in November 2013. Pharmacy consultation/evaluation noted and appreciated.   Bilateral lower extremity edema with superimposed left lower extremity cellulitis. The edema is likely multifactorial. Left leg cellulitis  Continues to resolve. Diuretic therapy was started by Dr. Kristian Covey. Vancomycin day #4.   Diabetes mellitus, type II, with nephropathy and probable neuropathy. Her CBGs were trending upward, likely secondary to IV hydrocortisone given 06/29/12.  Lantus resumed and sliding scale NovoLog. Hemoglobin A1c is 7.5. Improved control. CBG range 180-234. Appreciate diabetes coordinator input. Will increase lantus to 35%.   COPD. query obstructive sleep apnea.  Appears at baseline this am. Continue Atrovent/albuterol nebulizers and Dulera. Tessalon Perles for cough. Sats 97% on 2.5L oxygen. We'll defer outpatient sleep study to her primary care physician.   CAD/hypertension. No complaints CP. Troponin I is negative x2. BP controlled. SBP range 97-122. Continue current regimen.  Hypothyroidism. We'll continue Synthroid. TSH is mildly elevated, but her free T4 is within normal limits. Will maintain Synthroid at the same dose.   Chronic adrenal insufficiency. On chronic prednisone. The patient was given to stress doses of IV hydrocortisone 06/29/12. Restarted prednisone 06/30/12, but at a slightly higher dose. When she is stable for discharge,  Anticipate  decreasing the dose back to 5 mg  daily.   Morbid obesity and deconditioning. Home health therapy recommended by the therapists.   Code Status:  Family Communication:  Pt at bedside. No family present Disposition Plan: home with [facility] hopefully tomorrow   Consultants:  Neurology  Nephrology   Procedures:  none  Antibiotics:  Rocephin 06/27/12>>>  Vancomycin 06/28/12>>>  HPI/Subjective: Sitting up eating breakfast. Complains of burning with urination. No other complaints  Objective: Filed Vitals:   06/30/12 1425 06/30/12 2100 07/01/12 0500 07/01/12 0715  BP: 122/63 111/72 110/77   Pulse: 92 95 85   Temp: 97.8 F (36.6 C) 98.7 F (37.1 C) 98 F (36.7 C)   TempSrc: Oral Oral Oral   Resp: 18 22 20    Height:      Weight:      SpO2: 98% 95% 97% 96%    Intake/Output Summary (Last 24 hours) at 07/01/12 0836 Last data filed at 06/30/12 1800  Gross per 24 hour  Intake   1595 ml  Output      0 ml  Net   1595 ml   Filed Weights   06/27/12 2322 06/29/12 0649 06/30/12 0609  Weight: 136.3 kg (300 lb 7.8 oz) 139 kg (306 lb 7 oz) 141 kg (310 lb 13.6 oz)    Exam:   General:  Obese, alert NAD  Cardiovascular: RRR HS distant 1-2+LEE.  Respiratory: mild increased work of breathing with conversation. BS distant somewhat coarse with faint wheeze. No crackles  Abdomen: morbidly obese, soft +BS non-tender to palpation  Data Reviewed: Basic Metabolic Panel:  Lab 07/01/12 1191 06/30/12 0524 06/29/12 0528 06/28/12 1124 06/28/12 0806  NA 140 138 143 139 --  K 4.0 4.4 4.3 2.8* --  CL 105 105 110 101 --  CO2 28 26 23 25  --  GLUCOSE 225* 248* 145* 151* --  BUN 14 15 20 23  --  CREATININE 1.59* 1.62* 2.17* 2.34* --  CALCIUM 8.3* 8.0* 8.0* 7.9* 7.7*  MG -- -- -- -- 1.8  PHOS -- -- -- -- --   Liver Function Tests:  Lab 06/28/12 0222 06/27/12 1854  AST 16 19  ALT 33 38*  ALKPHOS 75 90  BILITOT 0.4 0.6  PROT 5.8* 6.6  ALBUMIN 2.6* 3.0*    Lab 06/27/12 1854  LIPASE 30   AMYLASE --    Lab 06/29/12 0934 06/27/12 2014  AMMONIA 15 34   CBC:  Lab 07/01/12 0528 06/30/12 0524 06/29/12 0528 06/28/12 0222 06/27/12 1854  WBC 8.4 7.9 8.8 10.5 12.2*  NEUTROABS -- -- -- -- 9.6*  HGB 7.9* 7.7* 7.7* 8.0* 9.2*  HCT 27.4* 26.9* 26.2* 26.6* 30.8*  MCV 85.6 85.4 84.8 82.9 83.0  PLT 222 207 194 194 224   Cardiac Enzymes:  Lab 06/28/12 0806 06/28/12 0222 06/27/12 2014  CKTOTAL -- -- --  CKMB -- -- --  CKMBINDEX -- -- --  TROPONINI <0.30 <0.30 <0.30   BNP (last 3 results) No results found for this basename: PROBNP:3 in the last 8760 hours CBG:  Lab 07/01/12 0745 06/30/12 2105 06/30/12 1629 06/30/12 1240 06/30/12 1116  GLUCAP 180* 265* 174* 247* 298*    Recent Results (from the past 240 hour(s))  URINE CULTURE     Status: Normal   Collection Time   06/27/12  7:43 PM      Component Value Range Status Comment   Specimen Description URINE, CATHETERIZED   Final    Special Requests NONE   Final    Culture  Setup Time 06/28/2012 20:42  Final    Colony Count NO GROWTH   Final    Culture NO GROWTH   Final    Report Status 06/29/2012 FINAL   Final   URINE CULTURE     Status: Normal   Collection Time   06/28/12  9:17 AM      Component Value Range Status Comment   Specimen Description URINE, CLEAN CATCH   Final    Special Requests NONE   Final    Culture  Setup Time 06/28/2012 20:42   Final    Colony Count NO GROWTH   Final    Culture NO GROWTH   Final    Report Status 06/29/2012 FINAL   Final      Studies: Mr Brain Wo Contrast  06/30/2012  *RADIOLOGY REPORT*  Clinical Data: Altered mental status  MRI HEAD WITHOUT CONTRAST  Technique:  Multiplanar, multiecho pulse sequences of the brain and surrounding structures were obtained according to standard protocol without intravenous contrast.  Comparison: CT head 06/27/2012  Findings: Image quality limited due to large patient size with difficulty positioning the patient in the head coil.  There is mild motion.   Negative for acute infarct.  Mild chronic microvascular ischemic changes in the white matter.  No cortical infarct.  Brainstem is normal.  Negative for mass or edema.  No shift of the midline structures. Negative for intracranial hemorrhage.  Paranasal sinuses are clear.  Bilateral mastoid sinus effusion.  IMPRESSION: No acute intracranial abnormality.  Mild chronic microvascular ischemia in the white matter.  Bilateral mastoid sinus effusion.   Original Report Authenticated By: Janeece Riggers, M.D.     Scheduled Meds:   . albuterol  2.5 mg Nebulization Q6H  . aspirin EC  81 mg Oral Daily  . benzonatate  100 mg Oral TID  . calcitRIOL  0.25 mcg Oral Daily  . cefTRIAXone (ROCEPHIN)  IV  1 g Intravenous Q24H  . fluticasone  1 spray Each Nare Daily  . furosemide  60 mg Intravenous BID  . gabapentin  100 mg Oral TID  . insulin aspart  0-20 Units Subcutaneous TID WC  . insulin aspart  0-5 Units Subcutaneous QHS  . insulin glargine  30 Units Subcutaneous QHS  . ipratropium  0.5 mg Nebulization Q6H  . levothyroxine  200 mcg Oral QAC breakfast  . mometasone-formoterol  2 puff Inhalation BID  . pantoprazole  40 mg Oral Daily  . polyethylene glycol  17 g Oral Daily  . potassium chloride  20 mEq Oral BID  . predniSONE  10 mg Oral Q breakfast  . Rivaroxaban  20 mg Oral Daily  . senna-docusate  1 tablet Oral BID  . sertraline  50 mg Oral Daily  . vancomycin  1,250 mg Intravenous Q24H  . vitamin B-12  1,000 mcg Oral Daily   Continuous Infusions:   . sodium chloride 20 mL/hr at 06/30/12 1610    Principal Problem:  *Acute encephalopathy Active Problems:  HYPOTHYROIDISM  HYPERTENSION  CAD  Diabetes mellitus type 2 with complications  Adrenal insufficiency  DVT (deep venous thrombosis)  ARF (acute renal failure)  UTI (lower urinary tract infection)  Chronic anticoagulation  Hypokalemia  Shortness of breath  Vitamin B12 deficiency  Sinusitis chronic, frontal  Bilateral lower extremity  edema  Left leg cellulitis  Morbid obesity  Chronic kidney disease, stage III (moderate)  Lethargy    Time spent: 35 minutes    [facility]  [facility]  If 8PM-8AM, please contact night-coverage at [email or web address],  password [facility] 07/01/2012, 8:36 AM  LOS: 4 days     Attending note:  Patient seen and examined.  Above note reviewed. Patient's encephalopathy has improved. This is likely secondary to underlying infection. She is on appropriate antibiotics. Her renal function continues to improve. Appreciate nephrology assistance. Anemia is likely due to combination of iron deficiency as well as B12 deficiency. She is on IV iron therapy as well as oral B12 supplements. She has chronic lower extremity edema. I suspect erythema in her lower extremities may be due to venous stasis more than active infection. Continue current treatment for now. She is on IV Lasix. Will request daily weights. She is continued on anticoagulation for history of left lower extremity DVT. She reports that her breathing is currently at baseline. She does have COPD. We will ambulate the patient to see if she qualifies for home oxygen. Anticipate that she'll be ready for discharge in the next 1 to 2 days.

## 2012-07-01 NOTE — Progress Notes (Signed)
Inpatient Diabetes Program Recommendations  AACE/ADA: New Consensus Statement on Inpatient Glycemic Control (2013)  Target Ranges:  Prepandial:   less than 140 mg/dL      Peak postprandial:   less than 180 mg/dL (1-2 hours)      Critically ill patients:  140 - 180 mg/dL   Results for KHARISMA, GLASNER (MRN [id number]) as of 07/01/2012 08:45  Ref. Range 06/30/2012 07:56 06/30/2012 11:16 06/30/2012 12:40 06/30/2012 16:29 06/30/2012 21:05 07/01/2012 07:45  Glucose-Capillary Latest Range: 70-99 mg/dL 409 (H) 811 (H) 914 (H) 174 (H) 265 (H) 180 (H)    Inpatient Diabetes Program Recommendations Insulin - Basal: Please consider increasing Lantus to 35 units QHS.  Note: Blood glucose has ranged from 174-298 mg/dl over the past 24 hours.  Currently, patient is ordered to receive Lantus 30 units QHS and Novolog correction ACHS for inpatient glycemic control.  Please consider increasing Lantus to 35 units QHS.  According to the chart patient is only eating 25-50% of meals, therefore I am reluctant to recommend meal coverage at this time.  Will continue to follow.  Thanks, Orlando Penner, RN, BSN, CCRN Diabetes Coordinator Inpatient Diabetes Program [phone]

## 2012-07-01 NOTE — Progress Notes (Signed)
Physical Therapy Treatment Patient Details Name: Ann [name] MRN: [id number] DOB: Jul 31, 1945 Today's Date: 07/01/2012 Time: 1100-1115 PT Time Calculation (min): 15 min  PT Assessment / Plan / Recommendation Comments on Treatment Session  Min A transfer training from chair to bed, pt able to demonstrate safe mechanics with hand and leg placement following cues.  Pt left in bed following request to return with call bell within reach and family member in room.    Follow Up Recommendations        Does the patient have the potential to tolerate intense rehabilitation     Barriers to Discharge        Equipment Recommendations       Recommendations for Other Services    Frequency     Plan      Precautions / Restrictions Precautions Precautions: Fall Restrictions Weight Bearing Restrictions: No   Pertinent Vitals/Pain     Mobility  Bed Mobility Bed Mobility: Left Sidelying to Sit;Sitting - Scoot to Edge of Bed Left Sidelying to Sit: With rails;3: Mod assist Sitting - Scoot to Edge of Bed: 4: Min assist Transfers Transfers: Sit to Stand;Stand to Sit Sit to Stand: 4: Min assist;With upper extremity assist Stand to Sit: 4: Min assist;With upper extremity assist Details for Transfer Assistance: cueing for hand placement for safety Ambulation/Gait Ambulation/Gait Assistance: 4: Min assist Ambulation Distance (Feet): 5 Feet Assistive device: Rolling walker Ambulation/Gait Assistance Details: pt with SOB following 14 feet gait training with RW Gait Pattern: Decreased stride length;Trunk flexed Gait velocity: slow and labored Stairs: No Wheelchair Mobility Wheelchair Mobility: No    Exercises General Exercises - Lower Extremity Ankle Circles/Pumps: AROM;Both;15 reps;Supine Quad Sets: AROM;Both;10 reps;Supine Gluteal Sets: AROM;Both;10 reps;Supine Heel Slides: Both;10 reps;AAROM;Supine   PT Diagnosis:    PT Problem List:   PT Treatment Interventions:     PT  Goals Acute Rehab PT Goals PT Goal Formulation: With patient Time For Goal Achievement: 07/03/12 Potential to Achieve Goals: Good Pt will go Supine/Side to Sit: with min assist PT Goal: Supine/Side to Sit - Progress: Progressing toward goal Pt will go Sit to Stand: with min assist PT Goal: Sit to Stand - Progress: Progressing toward goal Pt will go Stand to Sit: with supervision PT Goal: Stand to Sit - Progress: Progressing toward goal Pt will Transfer Bed to Chair/Chair to Bed: with supervision Pt will Ambulate: 16 - 50 feet;with supervision PT Goal: Ambulate - Progress: Progressing toward goal  Visit Information  Last PT Received On: 07/01/12    Subjective Data  Subjective: I am tired, ready to get back in bed.   Cognition  Cognition Overall Cognitive Status: Appears within functional limits for tasks assessed/performed Arousal/Alertness: Awake/alert Orientation Level: Appears intact for tasks assessed Behavior During Session: [facility] for tasks performed    Balance     End of Session PT - End of Session Equipment Utilized During Treatment: Gait belt Activity Tolerance: Patient tolerated treatment well Patient left: with call bell/phone within reach;in bed;with family/visitor present Nurse Communication: Mobility status   GP     Juel Burrow 07/01/2012, 11:22 AM
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
## 2012-07-01 NOTE — Progress Notes (Signed)
UR Chart Review Completed  

## 2012-07-01 NOTE — Progress Notes (Signed)
Physical Therapy Treatment Patient Details Name: Ann [name] MRN: [id number] DOB: September 18, 1945 Today's Date: 07/01/2012 Time: 3244-0102 PT Time Calculation (min): 31 min  PT Assessment / Plan / Recommendation Comments on Treatment Session  Pt with improved bed mobility, less assistance required with cueing for hand placement with STS for safety.  Increased gait training distance with cueing for posture and to increase stride length for energy conversation and to improve gait mechanics.  Pt limited by fatigue at end of session.  Left in chair with call bell and telephone in reach, chair alarm set and family member present.      Follow Up Recommendations        Does the patient have the potential to tolerate intense rehabilitation     Barriers to Discharge        Equipment Recommendations       Recommendations for Other Services    Frequency     Plan      Precautions / Restrictions Precautions Precautions: Fall   Pertinent Vitals/Pain     Mobility  Bed Mobility Bed Mobility: Left Sidelying to Sit;Sitting - Scoot to Edge of Bed Left Sidelying to Sit: With rails;3: Mod assist Sitting - Scoot to Edge of Bed: 4: Min assist Transfers Transfers: Sit to Stand;Stand to Sit Sit to Stand: 4: Min assist;With upper extremity assist Details for Transfer Assistance: Pt explained safe mechanics with hand placement with STS Ambulation/Gait Ambulation/Gait Assistance: 4: Min assist;4: Min guard Ambulation Distance (Feet): 14 Feet Assistive device: Rolling walker Ambulation/Gait Assistance Details: pt with SOB following 14 feet gait training with RW Gait Pattern: Decreased stride length;Trunk flexed Gait velocity: slow and labored Stairs: No Wheelchair Mobility Wheelchair Mobility: No    Exercises General Exercises - Lower Extremity Ankle Circles/Pumps: AROM;Both;15 reps;Supine Quad Sets: AROM;Both;10 reps;Supine Gluteal Sets: AROM;Both;10 reps;Supine Heel Slides: Both;10  reps;AAROM;Supine   PT Diagnosis:    PT Problem List:   PT Treatment Interventions:     PT Goals Acute Rehab PT Goals PT Goal Formulation: With patient Time For Goal Achievement: 07/03/12 Potential to Achieve Goals: Good Pt will go Supine/Side to Sit: with min assist PT Goal: Supine/Side to Sit - Progress: Progressing toward goal Pt will go Sit to Stand: with min assist PT Goal: Sit to Stand - Progress: Progressing toward goal Pt will go Stand to Sit: with supervision PT Goal: Stand to Sit - Progress: Progressing toward goal Pt will Transfer Bed to Chair/Chair to Bed: with supervision Pt will Ambulate: 16 - 50 feet;with supervision PT Goal: Ambulate - Progress: Progressing toward goal  Visit Information  Last PT Received On: 07/01/12    Subjective Data  Subjective: Family member in room upon therapist entrance, stated he was upset at how long pt had to sit up yesterday, wishes for pt to go back to bed earlier today.   Cognition  Cognition Overall Cognitive Status: Appears within functional limits for tasks assessed/performed Arousal/Alertness: Awake/alert Orientation Level: Appears intact for tasks assessed Behavior During Session: [facility] for tasks performed    Balance     End of Session PT - End of Session Equipment Utilized During Treatment: Gait belt Activity Tolerance: Patient limited by fatigue Patient left: in chair;with call bell/phone within reach;with chair alarm set;with family/visitor present   GP     Juel Burrow 07/01/2012, 9:57 AM

## 2012-07-02 DIAGNOSIS — I4891 Unspecified atrial fibrillation: Secondary | ICD-10-CM

## 2012-07-02 DIAGNOSIS — I482 Chronic atrial fibrillation, unspecified: Secondary | ICD-10-CM | POA: Diagnosis present

## 2012-07-02 DIAGNOSIS — I82409 Acute embolism and thrombosis of unspecified deep veins of unspecified lower extremity: Secondary | ICD-10-CM

## 2012-07-02 LAB — GLUCOSE, CAPILLARY
Glucose-Capillary: 257 mg/dL — ABNORMAL HIGH (ref 70–99)
Glucose-Capillary: 283 mg/dL — ABNORMAL HIGH (ref 70–99)

## 2012-07-02 LAB — BASIC METABOLIC PANEL
BUN: 12 mg/dL (ref 6–23)
CO2: 29 mEq/L (ref 19–32)
Calcium: 8.3 mg/dL — ABNORMAL LOW (ref 8.4–10.5)
Chloride: 103 mEq/L (ref 96–112)
Creatinine, Ser: 1.49 mg/dL — ABNORMAL HIGH (ref 0.50–1.10)
GFR calc Af Amer: 41 mL/min — ABNORMAL LOW (ref >90)
GFR calc non Af Amer: 35 mL/min — ABNORMAL LOW (ref >90)
Glucose, Bld: 240 mg/dL — ABNORMAL HIGH (ref 70–99)
Potassium: 3.7 mEq/L (ref 3.5–5.1)
Sodium: 140 mEq/L (ref 135–145)

## 2012-07-02 LAB — CBC
HCT: 26.5 % — ABNORMAL LOW (ref 36.0–46.0)
MCH: 24.8 pg — ABNORMAL LOW (ref 26.0–34.0)
MCHC: 28.7 g/dL — ABNORMAL LOW (ref 30.0–36.0)
MCV: 86.3 fL (ref 78.0–100.0)
RDW: 18.8 % — ABNORMAL HIGH (ref 11.5–15.5)

## 2012-07-02 LAB — ABO/RH: ABO/RH(D): O POS

## 2012-07-02 MED ORDER — INSULIN ASPART 100 UNIT/ML ~~LOC~~ SOLN
3.0000 [IU] | Freq: Three times a day (TID) | SUBCUTANEOUS | Status: DC
  Administered 2012-07-02 – 2012-07-03 (×4): 3 [IU] via SUBCUTANEOUS

## 2012-07-02 MED ORDER — DILTIAZEM HCL 25 MG/5ML IV SOLN
10.0000 mg | Freq: Once | INTRAVENOUS | Status: AC
  Administered 2012-07-02: 10 mg via INTRAVENOUS
  Filled 2012-07-02: qty 5

## 2012-07-02 MED ORDER — CHLORHEXIDINE GLUCONATE CLOTH 2 % EX PADS
6.0000 | MEDICATED_PAD | Freq: Every day | CUTANEOUS | Status: DC
  Administered 2012-07-04 – 2012-07-06 (×3): 6 via TOPICAL

## 2012-07-02 MED ORDER — SODIUM CHLORIDE 0.9 % IV SOLN
125.0000 mg | Freq: Once | INTRAVENOUS | Status: AC
  Administered 2012-07-02: 125 mg via INTRAVENOUS
  Filled 2012-07-02: qty 10

## 2012-07-02 MED ORDER — DILTIAZEM LOAD VIA INFUSION
10.0000 mg | Freq: Once | INTRAVENOUS | Status: AC
  Administered 2012-07-02: 10 mg via INTRAVENOUS
  Filled 2012-07-02: qty 10

## 2012-07-02 MED ORDER — INSULIN ASPART 100 UNIT/ML ~~LOC~~ SOLN: 0.0000 [IU] | Freq: Three times a day (TID) | SUBCUTANEOUS | Status: DC

## 2012-07-02 MED ORDER — DEXTROSE 5 % IV SOLN
5.0000 mg/h | INTRAVENOUS | Status: DC
  Administered 2012-07-02: 5 mg/h via INTRAVENOUS
  Administered 2012-07-02: 10 mg/h via INTRAVENOUS
  Administered 2012-07-03 (×2): 15 mg/h via INTRAVENOUS
  Filled 2012-07-02: qty 100

## 2012-07-02 MED ORDER — LEVALBUTEROL HCL 0.63 MG/3ML IN NEBU
0.6300 mg | INHALATION_SOLUTION | Freq: Four times a day (QID) | RESPIRATORY_TRACT | Status: DC
  Administered 2012-07-02 – 2012-07-06 (×17): 0.63 mg via RESPIRATORY_TRACT
  Filled 2012-07-02 (×17): qty 3

## 2012-07-02 MED ORDER — MUPIROCIN 2 % EX OINT
1.0000 "application " | TOPICAL_OINTMENT | Freq: Two times a day (BID) | CUTANEOUS | Status: DC
  Administered 2012-07-02 – 2012-07-06 (×8): 1 via NASAL
  Filled 2012-07-02: qty 22

## 2012-07-02 MED ORDER — PREDNISONE 20 MG PO TABS
30.0000 mg | ORAL_TABLET | Freq: Every day | ORAL | Status: AC
  Administered 2012-07-02: 30 mg via ORAL
  Filled 2012-07-02: qty 1

## 2012-07-02 MED ORDER — PREDNISONE 20 MG PO TABS
40.0000 mg | ORAL_TABLET | Freq: Every day | ORAL | Status: DC
Start: 2012-07-03 — End: 2012-07-06
  Administered 2012-07-03 – 2012-07-05 (×3): 40 mg via ORAL
  Filled 2012-07-02 (×4): qty 2

## 2012-07-02 MED ORDER — LEVALBUTEROL HCL 0.63 MG/3ML IN NEBU: 0.6300 mg | INHALATION_SOLUTION | RESPIRATORY_TRACT | Status: DC | PRN

## 2012-07-02 NOTE — Progress Notes (Signed)
ONE UNIT PRBC INFUSION COMPLETED. NO OBSERVED REACTION.

## 2012-07-02 NOTE — Progress Notes (Signed)
*  PRELIMINARY RESULTS* Echocardiogram 2D Echocardiogram has been performed.  Ann [name] 07/02/2012, 2:52 PM

## 2012-07-02 NOTE — Progress Notes (Signed)
07/02/12 1235 Patient reported to be running tachy in the 190s per telemetry monitor. On assessment, pt denies pain or respiratory distress, has been eating lunch. Vitals stable, see flowsheet. Heart rate tachy and irregular on auscultation. Notified Dr Kerry Hough. Stated would like stat EKG and notify of results. Also will place orders for cardiazem. RT notified of stat EKG order. Nursing to monitor. Earnstine Regal, RN

## 2012-07-02 NOTE — Progress Notes (Signed)
[facility] PROGRESS NOTE  Ann [name] JYN:[id number] DOB: 1945-09-16 DOA: 06/27/2012 PCP: [name], MD  Assessment/Plan: 1. Acute encephalopathy. Resolved. Appears at baseline today. No further work up needed per neurology.   Urinary tract infection. Rocephin day #6 of 7. Urine culture reveals no growth. Reports less burning on urination.   Chronic ethmoid and frontal sinusitis. Possible mastoiditis. Much improved. Continue Rocephin and steroid nasal spray and as needed nasal saline.   Anemia with associated vitamin B12 deficiency. Anemia panel from 06/13/2012 revealed vitamin B12 low at 206, normal folate at 6.5, total iron mildly low at 20, normal TIBC of 351, low-normal ferritin of 42, and normal total reticulocyte count of 95.   Remains stable today at 7.6. B12 for 3 days. IV iron today per nephrology.    Sigmoid diverticulosis and questionable rectal polyp. Per barium enema study on 06/08/2012 ordered by GI Dr. Russella Dar. Apparently, the patient was too high risk for colonoscopy per family.    Acute renal failure, superimposed on chronic kidney disease, likely stage III.  It is likely she has stage III chronic kidney disease secondary to a combination of hypertension and diabetes mellitus. Creatinine continues to trend downward today. Current level in baseline range. Follow renal recommendations.   Hypokalemia. Resolved following potassium chloride supplementation. Magnesium level is within normal limits. Hypokalemia may be secondary to chronic diuretic therapy. Trending down slightly. Will supplement and recheck.    History of left lower extremity DVT. She is anticoagulated with rivaroxaban. She was diagnosed with left lower extremity DVT per ultrasound in November 2013. Pharmacy consultation/evaluation noted and appreciated.   Bilateral lower extremity edema with superimposed left lower extremity cellulitis. The edema is likely multifactorial. Left leg cellulitis continues to  resolve. Diuretic therapy was started by Dr. Kristian Covey. Remains on IV lasix. Home dose is 60mg  po BID. Consider transitioning to po.  Vancomycin day #5.   Diabetes mellitus, type II, with nephropathy and probable neuropathy. Her CBGs were trending upward, likely secondary to IV hydrocortisone given 06/29/12. Lantus resumed and sliding scale NovoLog. Hemoglobin A1c is 7.5.  CBG range 180-344. Appreciate diabetes coordinator input. Will add meal coverage.    COPD. query obstructive sleep apnea. Appears at baseline this am. Continue Atrovent/albuterol nebulizers and Dulera. Tessalon Perles for cough. Sats 97% on 2.5L oxygen. We'll defer outpatient sleep study to her primary care physician. Will trial for home oxygen. Will likely need.   CAD/hypertension. No complaints CP. Troponin I is negative x2. BP controlled. SBP range 97-122. Continue current regimen.   Hypothyroidism. We'll continue Synthroid. TSH is mildly elevated, but her free T4 is within normal limits. Will maintain Synthroid at the same dose.   Chronic adrenal insufficiency. On chronic prednisone. The patient was given to stress doses of IV hydrocortisone 06/29/12. Restarted prednisone 06/30/12, but at a slightly higher dose. When she is stable for discharge, Anticipate decreasing the dose back to 5 mg daily.  Morbid obesity and deconditioning. Home health therapy recommended by the therapists.   Code Status: full Family Communication: husband at bedside Disposition Plan: home when ready, late today or tomorrow   Consultants:  Neurology  nephrology  Procedures:  none  Antibiotics:  Rocephin 06/27/12>>>07/03/12  Vancomycin 06/28/12>>>>07/02/12  HPI/Subjective: Sitting up in chair. Reports feeling better. Increased work of breathing with walking in room.  Objective: Filed Vitals:   07/01/12 2151 07/01/12 2152 07/02/12 0604 07/02/12 0729  BP:   99/56   Pulse:   84   Temp:   98 F (36.7  C)   TempSrc:   Oral   Resp:   24   Height:       Weight:      SpO2: 78% 96% 99% 99%    Intake/Output Summary (Last 24 hours) at 07/02/12 0912 Last data filed at 07/02/12 3086  Gross per 24 hour  Intake    714 ml  Output   3500 ml  Net  -2786 ml   Filed Weights   06/27/12 2322 06/29/12 0649 06/30/12 0609  Weight: 136.3 kg (300 lb 7.8 oz) 139 kg (306 lb 7 oz) 141 kg (310 lb 13.6 oz)    Exam:   General:  Awake morbidly obese, NAD  Cardiovascular: RRR 1-2+LEE PPP  Respiratory: increased work of breathing from walking to chair. BS with fair air flow, bilateral faint wheezing throughout.   Abdomen: obese soft  +BS non-tender to  palpation  Data Reviewed: Basic Metabolic Panel:  Lab 07/02/12 5784 07/01/12 0528 06/30/12 0524 06/29/12 0528 06/28/12 1124 06/28/12 0806  NA 140 140 138 143 139 --  K 3.7 4.0 4.4 4.3 2.8* --  CL 103 105 105 110 101 --  CO2 29 28 26 23 25  --  GLUCOSE 240* 225* 248* 145* 151* --  BUN 12 14 15 20 23  --  CREATININE 1.49* 1.59* 1.62* 2.17* 2.34* --  CALCIUM 8.3* 8.3* 8.0* 8.0* 7.9* --  MG -- -- -- -- -- 1.8  PHOS -- -- -- -- -- --   Liver Function Tests:  Lab 06/28/12 0222 06/27/12 1854  AST 16 19  ALT 33 38*  ALKPHOS 75 90  BILITOT 0.4 0.6  PROT 5.8* 6.6  ALBUMIN 2.6* 3.0*    Lab 06/27/12 1854  LIPASE 30  AMYLASE --    Lab 06/29/12 0934 06/27/12 2014  AMMONIA 15 34   CBC:  Lab 07/02/12 0536 07/01/12 0528 06/30/12 0524 06/29/12 0528 06/28/12 0222 06/27/12 1854  WBC 7.2 8.4 7.9 8.8 10.5 --  NEUTROABS -- -- -- -- -- 9.6*  HGB 7.6* 7.9* 7.7* 7.7* 8.0* --  HCT 26.5* 27.4* 26.9* 26.2* 26.6* --  MCV 86.3 85.6 85.4 84.8 82.9 --  PLT 215 222 207 194 194 --   Cardiac Enzymes:  Lab 06/28/12 0806 06/28/12 0222 06/27/12 2014  CKTOTAL -- -- --  CKMB -- -- --  CKMBINDEX -- -- --  TROPONINI <0.30 <0.30 <0.30   BNP (last 3 results) No results found for this basename: PROBNP:3 in the last 8760 hours CBG:  Lab 07/02/12 0724 07/01/12 2127 07/01/12 1651 07/01/12 1208 07/01/12 0745   GLUCAP 215* 188* 344* 270* 180*    Recent Results (from the past 240 hour(s))  URINE CULTURE     Status: Normal   Collection Time   06/27/12  7:43 PM      Component Value Range Status Comment   Specimen Description URINE, CATHETERIZED   Final    Special Requests NONE   Final    Culture  Setup Time 06/28/2012 20:42   Final    Colony Count NO GROWTH   Final    Culture NO GROWTH   Final    Report Status 06/29/2012 FINAL   Final   URINE CULTURE     Status: Normal   Collection Time   06/28/12  9:17 AM      Component Value Range Status Comment   Specimen Description URINE, CLEAN CATCH   Final    Special Requests NONE   Final    Culture  Setup  Time 06/28/2012 20:42   Final    Colony Count NO GROWTH   Final    Culture NO GROWTH   Final    Report Status 06/29/2012 FINAL   Final      Studies: No results found.  Scheduled Meds:   . albuterol  2.5 mg Nebulization Q6H  . aspirin EC  81 mg Oral Daily  . benzonatate  100 mg Oral TID  . calcitRIOL  0.25 mcg Oral Daily  . cefTRIAXone (ROCEPHIN)  IV  1 g Intravenous Q24H  . ferric gluconate (FERRLECIT/NULECIT) IV  125 mg Intravenous Once  . fluticasone  1 spray Each Nare Daily  . furosemide  60 mg Intravenous BID  . gabapentin  100 mg Oral TID  . insulin aspart  0-20 Units Subcutaneous TID WC  . insulin aspart  0-5 Units Subcutaneous QHS  . insulin glargine  35 Units Subcutaneous QHS  . ipratropium  0.5 mg Nebulization Q6H  . levothyroxine  200 mcg Oral QAC breakfast  . mometasone-formoterol  2 puff Inhalation BID  . pantoprazole  40 mg Oral Daily  . polyethylene glycol  17 g Oral Daily  . potassium chloride  20 mEq Oral BID  . predniSONE  10 mg Oral Q breakfast  . Rivaroxaban  20 mg Oral Daily  . senna-docusate  1 tablet Oral BID  . sertraline  50 mg Oral Daily  . vancomycin  1,000 mg Intravenous Q24H  . vitamin B-12  1,000 mcg Oral Daily   Continuous Infusions:   . sodium chloride 20 mL/hr at 06/30/12 1610    Principal  Problem:  *Acute encephalopathy Active Problems:  HYPOTHYROIDISM  HYPERTENSION  CAD  Diabetes mellitus type 2 with complications  Adrenal insufficiency  DVT (deep venous thrombosis)  ARF (acute renal failure)  UTI (lower urinary tract infection)  Chronic anticoagulation  Hypokalemia  Shortness of breath  Vitamin B12 deficiency  Sinusitis chronic, frontal  Bilateral lower extremity edema  Left leg cellulitis  Morbid obesity  Chronic kidney disease, stage III (moderate)  Lethargy    Time spent: 30 minutes    [facility] M  [facility]  If 8PM-8AM, please contact night-coverage at [email or web address], password [email or web address] 07/02/2012, 9:12 AM  LOS: 5 days    Attending note:   Patient seen and examined.  Above note reviewed.    She remains short of breath with wheezing. With her advanced copd, we will give her a prednisone burst. Continue neb treatments.  She remains anemic and is receiving IV iron.  She feels very weak and tired.  Will give one unit of prbc for symptom relief.  LE edema persists, continue IV lasix.  Staff reported that patient went into A fib with RVR, with heart rate in the 170s.  Patient was not complaining of chest pain or shortness of breath.  She has been started on a cardizem infusion, 2d echo has been ordered.  Will ask cardiology to see. She is anticoagulated with rivaroxaban.  Transfer to step down unit for closer monitoring on cardizem infusion.

## 2012-07-02 NOTE — Progress Notes (Signed)
07/02/12 1419 Patient transferred to ICU rm 7 in stable condition. Pt stated comfortable, nursing staff present in room on arrival. Earnstine Regal, RN

## 2012-07-02 NOTE — Progress Notes (Signed)
Physical Therapy Treatment Patient Details Name: NAYELLIE SANSEVERINO MRN: [id number] DOB: 07-14-1945 Today's Date: 07/02/2012 Time: 0830-0909 PT Time Calculation (min): 39 min  PT Assessment / Plan / Recommendation Comments on Treatment Session  Improving mobility and activity tolerance.  Able to ambulate further distance with improved posture and less cueing to increase stride length for more normalized gait and energy conversation.  Pt left in chair with alarm set, call bell and telephone within reach and family members present in room.    Follow Up Recommendations        Does the patient have the potential to tolerate intense rehabilitation     Barriers to Discharge        Equipment Recommendations       Recommendations for Other Services    Frequency     Plan      Precautions / Restrictions Precautions Precautions: Fall Restrictions Weight Bearing Restrictions: No   Pertinent Vitals/Pain     Mobility  Bed Mobility Bed Mobility: Left Sidelying to Sit Left Sidelying to Sit: 4: Min assist;With rails Sitting - Scoot to Edge of Bed: 4: Min assist;With rail Transfers Transfers: Sit to Stand;Stand to [employer] to Stand: 4: Min assist;With upper extremity assist Stand to Sit: 4: Min assist;With upper extremity assist Details for Transfer Assistance: pt able to demonstrate safe hand mechanics with min cueing Ambulation/Gait Ambulation/Gait Assistance: 4: Min assist Ambulation Distance (Feet): 18 Feet Assistive device: Rolling walker Ambulation/Gait Assistance Details: slow and labored, SOB following 18 feet gait with RW Gait Pattern: Decreased stride length;Trunk flexed Gait velocity: slow and labored Stairs: No    Exercises General Exercises - Lower Extremity Ankle Circles/Pumps: AROM;Both;10 reps Quad Sets: AROM;Both;10 reps Gluteal Sets: AROM;Both;10 reps Short Arc Quad: AROM;Strengthening;Both;10 reps Heel Slides: AROM;Both;15 reps Hip ABduction/ADduction:  AROM;Strengthening;Both;15 reps   PT Diagnosis:    PT Problem List:   PT Treatment Interventions:     PT Goals Acute Rehab PT Goals PT Goal: Supine/Side to Sit - Progress: Progressing toward goal PT Goal: Sit to Stand - Progress: Progressing toward goal PT Goal: Stand to Sit - Progress: Progressing toward goal PT Transfer Goal: Bed to Chair/Chair to Bed - Progress: Progressing toward goal PT Goal: Ambulate - Progress: Progressing toward goal  Visit Information  Last PT Received On: 07/02/12    Subjective Data  Subjective: Feeling better, pt willing to sit up a little longer following explaination of benefits.   Cognition  Cognition Overall Cognitive Status: Appears within functional limits for tasks assessed/performed Arousal/Alertness: Awake/alert Orientation Level: Appears intact for tasks assessed Behavior During Session: [facility] for tasks performed    Balance     End of Session PT - End of Session Equipment Utilized During Treatment: Gait belt Activity Tolerance: Patient limited by fatigue Patient left: in chair;with call bell/phone within reach;with chair alarm set;with family/visitor present Nurse Communication: Mobility status   GP     Juel Burrow 07/02/2012, 9:14 AM

## 2012-07-02 NOTE — Progress Notes (Signed)
Subjective: Interval History: has no complaint of nausea or vomiting. Patient denies any difficulty in breathing..  Objective: Vital signs in last 24 hours: Temp:  [97.3 F (36.3 C)-98.5 F (36.9 C)] 98 F (36.7 C) (02/06 0604) Pulse Rate:  [77-123] 84  (02/06 0604) Resp:  [20-24] 24  (02/06 0604) BP: (99-135)/(56-74) 99/56 mmHg (02/06 0604) SpO2:  [78 %-99 %] 99 % (02/06 0729) Weight change:   Intake/Output from previous day: 02/05 0701 - 02/06 0700 In: 714 [P.O.:240; I.V.:474] Out: 3500 [Urine:3500] Intake/Output this shift:    General appearance: alert, cooperative and no distress Resp: diminished breath sounds anterior - bilateral and rhonchi anterior - bilateral Cardio: regular rate and rhythm, S1, S2 normal, no murmur, click, rub or gallop GI: soft, non-tender; bowel sounds normal; no masses,  no organomegaly Extremities: edema 1-2+ edema and also some erythema of bilateral legs.  Lab Results:  Basename 07/02/12 0536 07/01/12 0528  WBC 7.2 8.4  HGB 7.6* 7.9*  HCT 26.5* 27.4*  PLT 215 222   BMET:  Basename 07/02/12 0536 07/01/12 0528  NA 140 140  K 3.7 4.0  CL 103 105  CO2 29 28  GLUCOSE 240* 225*  BUN 12 14  CREATININE 1.49* 1.59*  CALCIUM 8.3* 8.3*   No results found for this basename: PTH:2 in the last 72 hours Iron Studies: No results found for this basename: IRON,TIBC,TRANSFERRIN,FERRITIN in the last 72 hours  Studies/Results: No results found.  I have reviewed the patient's current medications.  Assessment/Plan: Problem #1 acute kidney injury superimposed on chronic BUN is 12 creatinine is 1.49 renal function is progressively improving. Presently patient is none oliguric. Problem #2 hypertension her blood pressure is reasonably controlled Problem #3 diabetes  Problem #4 anemia her hemoglobin is 7.6 hematocrit is 26.5 stable. Problem #5 DVT Problem #6 adrenal insufficiency Problem #7 hypokalemia potassium is 3.7 has corrected.  Plan: We'll give  her another dose of her IV iron today We'll check her basic metabolic panel and phosphorus in the morning.   LOS: 5 days   Bryanna Yim S 07/02/2012,7:35 AM

## 2012-07-02 NOTE — Progress Notes (Signed)
07/02/12 1021 Patient had large, soft formed, brown stool this morning. Hemoccult sent as ordered, negative per lab result. Earnstine Regal, RN

## 2012-07-02 NOTE — Progress Notes (Signed)
07/02/12 1328 Patient given diltiazem 10 mg IV x 1 dose as ordered, vitals checked before and after, see flowsheet. HR in 160s-190s prior to administration, HR 120s-150s after administration. Patient monitored by two RNs during administration. Pt continues to deny pain, no respiratory distress. Stated "i'm tired" and has been sleepy. Called report to Billy Coast, RN receiving nurse in ICU. Patient requested her husband be notified of transfer to ICU rm 7. Called patient's husband at request and notified of transfer. Stated okay. Notified him that ICU nurse/MD would notify of any further changes. Earnstine Regal, RN

## 2012-07-03 ENCOUNTER — Inpatient Hospital Stay (HOSPITAL_COMMUNITY): Payer: Medicare Other

## 2012-07-03 LAB — CBC
Platelets: 231 10*3/uL (ref 150–400)
RBC: 3.61 MIL/uL — ABNORMAL LOW (ref 3.87–5.11)
RDW: 18.3 % — ABNORMAL HIGH (ref 11.5–15.5)
WBC: 10.2 10*3/uL (ref 4.0–10.5)

## 2012-07-03 LAB — BASIC METABOLIC PANEL
BUN: 13 mg/dL (ref 6–23)
CO2: 29 mEq/L (ref 19–32)
Chloride: 98 mEq/L (ref 96–112)
Creatinine, Ser: 1.3 mg/dL — ABNORMAL HIGH (ref 0.50–1.10)
Glucose, Bld: 319 mg/dL — ABNORMAL HIGH (ref 70–99)

## 2012-07-03 LAB — TYPE AND SCREEN
ABO/RH(D): O POS
Antibody Screen: NEGATIVE
Unit division: 0

## 2012-07-03 LAB — GLUCOSE, CAPILLARY
Glucose-Capillary: 321 mg/dL — ABNORMAL HIGH (ref 70–99)
Glucose-Capillary: 293 mg/dL — ABNORMAL HIGH (ref 70–99)

## 2012-07-03 MED ORDER — DIGOXIN 125 MCG PO TABS
0.1250 mg | ORAL_TABLET | Freq: Every day | ORAL | Status: DC
  Administered 2012-07-04 – 2012-07-06 (×3): 0.125 mg via ORAL
  Filled 2012-07-03 (×4): qty 1

## 2012-07-03 MED ORDER — DIGOXIN 0.25 MG/ML IJ SOLN
0.2500 mg | Freq: Four times a day (QID) | INTRAMUSCULAR | Status: AC
  Administered 2012-07-03 (×2): 0.25 mg via INTRAVENOUS
  Filled 2012-07-03 (×3): qty 2

## 2012-07-03 MED ORDER — DIGOXIN 0.25 MG/ML IJ SOLN
0.5000 mg | Freq: Once | INTRAMUSCULAR | Status: AC
  Administered 2012-07-03: 0.5 mg via INTRAVENOUS

## 2012-07-03 MED ORDER — INSULIN ASPART 100 UNIT/ML ~~LOC~~ SOLN
6.0000 [IU] | Freq: Three times a day (TID) | SUBCUTANEOUS | Status: DC
  Administered 2012-07-03 – 2012-07-04 (×3): 6 [IU] via SUBCUTANEOUS

## 2012-07-03 MED ORDER — FUROSEMIDE 10 MG/ML IJ SOLN
60.0000 mg | Freq: Three times a day (TID) | INTRAMUSCULAR | Status: DC
  Administered 2012-07-03 – 2012-07-04 (×3): 60 mg via INTRAVENOUS
  Filled 2012-07-03 (×3): qty 6

## 2012-07-03 MED ORDER — INSULIN GLARGINE 100 UNIT/ML ~~LOC~~ SOLN
40.0000 [IU] | Freq: Every day | SUBCUTANEOUS | Status: DC
  Administered 2012-07-03: 40 [IU] via SUBCUTANEOUS

## 2012-07-03 MED ORDER — DIGOXIN 0.25 MG/ML IJ SOLN
INTRAMUSCULAR | Status: AC
  Administered 2012-07-03: 10:00:00
  Filled 2012-07-03: qty 2

## 2012-07-03 MED ORDER — VERAPAMIL HCL 80 MG PO TABS
80.0000 mg | ORAL_TABLET | Freq: Three times a day (TID) | ORAL | Status: DC
  Administered 2012-07-03 – 2012-07-04 (×2): 80 mg via ORAL
  Filled 2012-07-03 (×2): qty 1

## 2012-07-03 NOTE — Progress Notes (Signed)
PT Cancellation Note  Patient Details Name: Ann [name] MRN: [id number] DOB: July 19, 1945   Cancelled Treatment:     Pt has been transferred to ICU due to cardiac issues.   We will hold PT until medical status is stable.   Konrad Penta 07/03/2012, 12:23 PM

## 2012-07-03 NOTE — Progress Notes (Signed)
[facility] PROGRESS NOTE  Ann [name] OZH:[id number] DOB: 01/04/1946 DOA: 06/27/2012 PCP: [name], MD  Assessment/Plan:  Atrial fibrillation with RVR. Patient went into rapid atrial fibrillation yesterday requiring transfer to the step down unit.  She was asymptomatic through this.  She was started on a cardizem infusion for heart rate control and is still uncontrolled today.  Echo done yesterday shows hyperdynamic EF.  Cardiology has been consulted. She is anticoagulated on xarelto.   Acute encephalopathy. Resolved. Appears at baseline today. No further work up needed per neurology.   Urinary tract infection. Rocephin day #7 of 7. Urine culture reveals no growth. Reports less burning on urination. Will d/c rocephin today.   Chronic ethmoid and frontal sinusitis. Possible mastoiditis. Much improved. Continue  steroid nasal spray and as needed nasal saline.   Anemia with associated vitamin B12 deficiency. Anemia panel from 06/13/2012 revealed vitamin B12 low at 206, normal folate at 6.5, total iron mildly low at 20, normal TIBC of 351, low-normal ferritin of 42, and normal total reticulocyte count of 95.   Patient has been receiving IV iron per nephrology and received 1 unit PRBC yesterday for symptomatic anemia. Her hemoglobin has responded appropriately.  Hemoccult stools are negative.    Sigmoid diverticulosis and questionable rectal polyp. Per barium enema study on 06/08/2012 ordered by GI Dr. Russella Dar. Apparently, the patient was too high risk for colonoscopy per family.    Acute renal failure, superimposed on chronic kidney disease, likely stage III.  It is likely she has stage III chronic kidney disease secondary to a combination of hypertension and diabetes mellitus. Creatinine continues to trend downward today. Current level in baseline range. Follow renal recommendations.   Hypokalemia. Resolved following potassium chloride supplementation. Magnesium level is within normal limits.  Hypokalemia may be secondary to chronic diuretic therapy. Trending down slightly. Will supplement and recheck.    History of left lower extremity DVT. She is anticoagulated with rivaroxaban. She was diagnosed with left lower extremity DVT per ultrasound in November 2013. Pharmacy consultation/evaluation noted and appreciated.   Bilateral lower extremity edema with erythema. The edema is likely multifactorial. It is likely that her LE erythema is due to venous stasis than an infective process.  Antibiotics were discontinued. Diuretic therapy was started by Dr. Kristian Covey. Remains on IV lasix. Home dose is 60mg  po BID.    Diabetes mellitus, type II, with nephropathy and probable neuropathy. Her CBGs were trending upward, likely secondary to increased steroids. Lantus resumed and sliding scale NovoLog. Hemoglobin A1c is 7.5.  CBG range 180-344. Appreciate diabetes coordinator input. Will add meal coverage.    COPD. query obstructive sleep apnea. She had significant wheezing yesterday so her prednisone dose was increased. Her breathing is improving today.  Continue current dose of steroids for today, can consider tapering tomorrow.  Continue nebs. Will likely need home oxygen.  CAD/hypertension. No complaints CP. Troponin I is negative x2. BP controlled. SBP range 97-122. Continue current regimen.   Hypothyroidism. We'll continue Synthroid. TSH is mildly elevated, but her free T4 is within normal limits. Will maintain Synthroid at the same dose.   Chronic adrenal insufficiency. On chronic prednisone. The patient was given to stress doses of IV hydrocortisone 06/29/12. Restarted prednisone 06/30/12, but at a slightly higher dose. When she is stable for discharge, Anticipate decreasing the dose back to 5 mg daily.   Morbid obesity and deconditioning. Home health therapy recommended by the therapists.   Code Status: full Family Communication: husband at bedside Disposition Plan:  home when  ready,   Consultants:  Neurology  nephrology  Procedures:  none  Antibiotics:  Rocephin 06/27/12>>>07/03/12  Vancomycin 06/28/12>>>>07/02/12  HPI/Subjective: Patient transferred to step down unit for rapid atrial fibrillation yesterday.  She denies any chest pain or palpitations. Feels breathing is improving.  She is coughing.  Objective: Filed Vitals:   07/03/12 0745 07/03/12 0800 07/03/12 0815 07/03/12 0819  BP: 155/63 136/60 148/73   Pulse: 83 78 89   Temp:  97.7 F (36.5 C)    TempSrc:  Oral    Resp: 19 14 20    Height:      Weight:      SpO2: 95% 97% 98% 98%    Intake/Output Summary (Last 24 hours) at 07/03/12 0931 Last data filed at 07/03/12 0800  Gross per 24 hour  Intake 1727.67 ml  Output   2350 ml  Net -622.33 ml   Filed Weights   06/29/12 0649 06/30/12 0609 07/03/12 0500  Weight: 139 kg (306 lb 7 oz) 141 kg (310 lb 13.6 oz) 142.7 kg (314 lb 9.5 oz)    Exam:   General:  Awake morbidly obese, NAD  Cardiovascular: RRR 1-2+LEE, improving.  PPP  Respiratory: increased work of breathing from walking to chair. BS with fair air flow, bilateral faint wheezing throughout.   Abdomen: obese soft  +BS non-tender to  palpation  Data Reviewed: Basic Metabolic Panel:  Lab 07/03/12 1610 07/02/12 0536 07/01/12 0528 06/30/12 0524 06/29/12 0528 06/28/12 0806  NA 136 140 140 138 143 --  K 3.7 3.7 4.0 4.4 4.3 --  CL 98 103 105 105 110 --  CO2 29 29 28 26 23  --  GLUCOSE 319* 240* 225* 248* 145* --  BUN 13 12 14 15 20  --  CREATININE 1.30* 1.49* 1.59* 1.62* 2.17* --  CALCIUM 8.4 8.3* 8.3* 8.0* 8.0* --  MG -- -- -- -- -- 1.8  PHOS 2.4 -- -- -- -- --   Liver Function Tests:  Lab 06/28/12 0222 06/27/12 1854  AST 16 19  ALT 33 38*  ALKPHOS 75 90  BILITOT 0.4 0.6  PROT 5.8* 6.6  ALBUMIN 2.6* 3.0*    Lab 06/27/12 1854  LIPASE 30  AMYLASE --    Lab 06/29/12 0934 06/27/12 2014  AMMONIA 15 34   CBC:  Lab 07/03/12 0509 07/02/12 0536 07/01/12 0528 06/30/12  0524 06/29/12 0528 06/27/12 1854  WBC 10.2 7.2 8.4 7.9 8.8 --  NEUTROABS -- -- -- -- -- 9.6*  HGB 9.1* 7.6* 7.9* 7.7* 7.7* --  HCT 30.6* 26.5* 27.4* 26.9* 26.2* --  MCV 84.8 86.3 85.6 85.4 84.8 --  PLT 231 215 222 207 194 --   Cardiac Enzymes:  Lab 06/28/12 0806 06/28/12 0222 06/27/12 2014  CKTOTAL -- -- --  CKMB -- -- --  CKMBINDEX -- -- --  TROPONINI <0.30 <0.30 <0.30   BNP (last 3 results) No results found for this basename: PROBNP:3 in the last 8760 hours CBG:  Lab 07/03/12 0744 07/03/12 0416 07/02/12 2147 07/02/12 1621 07/02/12 1132  GLUCAP 293* 321* 315* 283* 278*    Recent Results (from the past 240 hour(s))  URINE CULTURE     Status: Normal   Collection Time   06/27/12  7:43 PM      Component Value Range Status Comment   Specimen Description URINE, CATHETERIZED   Final    Special Requests NONE   Final    Culture  Setup Time 06/28/2012 20:42   Final  Colony Count NO GROWTH   Final    Culture NO GROWTH   Final    Report Status 06/29/2012 FINAL   Final   URINE CULTURE     Status: Normal   Collection Time   06/28/12  9:17 AM      Component Value Range Status Comment   Specimen Description URINE, CLEAN CATCH   Final    Special Requests NONE   Final    Culture  Setup Time 06/28/2012 20:42   Final    Colony Count NO GROWTH   Final    Culture NO GROWTH   Final    Report Status 06/29/2012 FINAL   Final   MRSA PCR SCREENING     Status: Abnormal   Collection Time   07/02/12  2:15 PM      Component Value Range Status Comment   MRSA by PCR POSITIVE (*) NEGATIVE Final      Studies: No results found.  Scheduled Meds:    . aspirin EC  81 mg Oral Daily  . benzonatate  100 mg Oral TID  . calcitRIOL  0.25 mcg Oral Daily  . cefTRIAXone (ROCEPHIN)  IV  1 g Intravenous Q24H  . Chlorhexidine Gluconate Cloth  6 each Topical Q0600  . fluticasone  1 spray Each Nare Daily  . furosemide  60 mg Intravenous BID  . gabapentin  100 mg Oral TID  . insulin aspart  0-20 Units  Subcutaneous TID WC  . insulin aspart  0-5 Units Subcutaneous QHS  . insulin aspart  3 Units Subcutaneous TID WC  . insulin glargine  35 Units Subcutaneous QHS  . ipratropium  0.5 mg Nebulization Q6H  . levalbuterol  0.63 mg Nebulization Q6H  . levothyroxine  200 mcg Oral QAC breakfast  . mometasone-formoterol  2 puff Inhalation BID  . mupirocin ointment  1 application Nasal BID  . pantoprazole  40 mg Oral Daily  . polyethylene glycol  17 g Oral Daily  . potassium chloride  20 mEq Oral BID  . predniSONE  40 mg Oral Q breakfast  . Rivaroxaban  20 mg Oral Daily  . senna-docusate  1 tablet Oral BID  . sertraline  50 mg Oral Daily  . vitamin B-12  1,000 mcg Oral Daily   Continuous Infusions:    . sodium chloride 20 mL/hr at 07/03/12 0800  . diltiazem (CARDIZEM) infusion 15 mg/hr (07/03/12 0800)    Principal Problem:  *Acute encephalopathy Active Problems:  HYPOTHYROIDISM  HYPERTENSION  CAD  Diabetes mellitus type 2 with complications  Adrenal insufficiency  DVT (deep venous thrombosis)  ARF (acute renal failure)  UTI (lower urinary tract infection)  Chronic anticoagulation  Hypokalemia  Shortness of breath  Vitamin B12 deficiency  Sinusitis chronic, frontal  Bilateral lower extremity edema  Left leg cellulitis  Morbid obesity  Chronic kidney disease, stage III (moderate)  Lethargy  Atrial fibrillation with RVR    Time spent: 30 minutes    MEMON,JEHANZEB  [facility]  If 8PM-8AM, please contact night-coverage at [email or web address], password [facility] 07/03/2012, 9:31 AM  LOS: 6 days

## 2012-07-03 NOTE — Progress Notes (Signed)
Subjective: Interval History: has no complaint of nausea or vomiting. Patient also denies any difficulty breathing..  Objective: Vital signs in last 24 hours: Temp:  [97.8 F (36.6 C)-99 F (37.2 C)] 98 F (36.7 C) (02/07 0400) Pulse Rate:  [56-164] 77  (02/07 0600) Resp:  [13-24] 19  (02/07 0600) BP: (86-150)/(42-97) 150/66 mmHg (02/07 0600) SpO2:  [94 %-100 %] 99 % (02/07 0600) Weight:  [142.7 kg (314 lb 9.5 oz)] 142.7 kg (314 lb 9.5 oz) (02/07 0500) Weight change:   Intake/Output from previous day: 02/06 0701 - 02/07 0700 In: 1897.7 [P.O.:660; I.V.:755.2; Blood:332.5; IV Piggyback:150] Out: 2350 [Urine:2350] Intake/Output this shift:    General appearance: alert, cooperative and no distress Resp: diminished breath sounds bilaterally and posterior - bilateral Cardio: irregularly irregular rhythm GI: soft, non-tender; bowel sounds normal; no masses,  no organomegaly Extremities: edema 2+ edema  Lab Results:  Basename 07/03/12 0509 07/02/12 0536  WBC 10.2 7.2  HGB 9.1* 7.6*  HCT 30.6* 26.5*  PLT 231 215   BMET:  Basename 07/03/12 0509 07/02/12 0536  NA 136 140  K 3.7 3.7  CL 98 103  CO2 29 29  GLUCOSE 319* 240*  BUN 13 12  CREATININE 1.30* 1.49*  CALCIUM 8.4 8.3*   No results found for this basename: PTH:2 in the last 72 hours Iron Studies: No results found for this basename: IRON,TIBC,TRANSFERRIN,FERRITIN in the last 72 hours  Studies/Results: No results found.  I have reviewed the patient's current medications.  Assessment/Plan: Problem #1 acute kidney injury superimposed on chronic her BUN is 50 creatinine is 1.30 renal function is progressively improving. Presently patient is none oliguric. Problem #2 hypokalemia potassium 3.7 corrected Problem #3 anemia iron deficiency anemia her hemoglobin 9.1 hematocrit 30.6 Problem #4 hypertension blood pressure seems reasonably controlled Problem #5 history of diabetes Problem #6 history of her renal  insufficiency Problem #7 hypothyroidism  Problem #8 metabolic bone disease calcium and phosphorus was in acceptable range PTH is high. Problem #9 after fibrillation. Plan: We'll start patient on Rocaltrol 0.25 mcg by mouth once a day. Continue his other as before. We'll check her basic metabolic panel in the morning.   LOS: 6 days   Xianna Siverling S 07/03/2012,7:55 AM

## 2012-07-03 NOTE — Progress Notes (Signed)
Inpatient Diabetes Program Recommendations  AACE/ADA: New Consensus Statement on Inpatient Glycemic Control (2013)  Target Ranges:  Prepandial:   less than 140 mg/dL      Peak postprandial:   less than 180 mg/dL (1-2 hours)      Critically ill patients:  140 - 180 mg/dL   Inpatient Diabetes Program Recommendations Insulin - Basal: Increase Lantus to 40 units Insulin - Meal Coverage: add Novolog with meals 5 units TID per Glycemic Control Order set (not given if eats less than 50%) Thank you  Piedad Climes BSN, RN,CDE Inpatient Diabetes Coordinator [phone] (team pager)

## 2012-07-03 NOTE — Progress Notes (Signed)
UR Chart Review Completed  
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
## 2012-07-03 NOTE — Plan of Care (Signed)
Problem: Phase II Progression Outcomes Goal: Dyspnea controlled at rest Outcome: Progressing Wheezes and crackles throughout the night.  Nonproductive cough Goal: Hemodynamically stable Outcome: Progressing Patient transferred to hight level of care due to Afib With RVR.  Arrived to ICU approx 1530 on 07/02/12.  Remains on Cardizem gtt for HR maintenance.  Gtt is running at 10cc/hr.

## 2012-07-04 LAB — BASIC METABOLIC PANEL
CO2: 33 mEq/L — ABNORMAL HIGH (ref 19–32)
Calcium: 8.8 mg/dL (ref 8.4–10.5)
Chloride: 96 mEq/L (ref 96–112)
Sodium: 137 mEq/L (ref 135–145)

## 2012-07-04 LAB — CBC
HCT: 31.1 % — ABNORMAL LOW (ref 36.0–46.0)
MCH: 25.8 pg — ABNORMAL LOW (ref 26.0–34.0)
MCV: 86.1 fL (ref 78.0–100.0)
RDW: 19.3 % — ABNORMAL HIGH (ref 11.5–15.5)
WBC: 10.4 10*3/uL (ref 4.0–10.5)

## 2012-07-04 LAB — GLUCOSE, CAPILLARY
Glucose-Capillary: 447 mg/dL — ABNORMAL HIGH (ref 70–99)
Glucose-Capillary: 426 mg/dL — ABNORMAL HIGH (ref 70–99)
Glucose-Capillary: 327 mg/dL — ABNORMAL HIGH (ref 70–99)

## 2012-07-04 MED ORDER — INSULIN ASPART 100 UNIT/ML ~~LOC~~ SOLN
25.0000 [IU] | Freq: Once | SUBCUTANEOUS | Status: AC
  Administered 2012-07-04: 25 [IU] via SUBCUTANEOUS

## 2012-07-04 MED ORDER — VERAPAMIL HCL 80 MG PO TABS
80.0000 mg | ORAL_TABLET | Freq: Four times a day (QID) | ORAL | Status: DC
  Administered 2012-07-04 – 2012-07-06 (×8): 80 mg via ORAL
  Filled 2012-07-04 (×8): qty 1

## 2012-07-04 MED ORDER — INSULIN GLARGINE 100 UNIT/ML ~~LOC~~ SOLN
50.0000 [IU] | Freq: Every day | SUBCUTANEOUS | Status: DC
  Administered 2012-07-04: 50 [IU] via SUBCUTANEOUS
  Administered 2012-07-05: 23:00:00 via SUBCUTANEOUS

## 2012-07-04 MED ORDER — INSULIN ASPART 100 UNIT/ML ~~LOC~~ SOLN
0.0000 [IU] | Freq: Three times a day (TID) | SUBCUTANEOUS | Status: DC
  Administered 2012-07-04 – 2012-07-05 (×2): 20 [IU] via SUBCUTANEOUS
  Administered 2012-07-05 (×2): 11 [IU] via SUBCUTANEOUS
  Administered 2012-07-06: 7 [IU] via SUBCUTANEOUS
  Administered 2012-07-06: 11 [IU] via SUBCUTANEOUS

## 2012-07-04 MED ORDER — TORSEMIDE 20 MG PO TABS
20.0000 mg | ORAL_TABLET | Freq: Every day | ORAL | Status: DC
  Administered 2012-07-04 – 2012-07-06 (×3): 20 mg via ORAL
  Filled 2012-07-04 (×3): qty 1

## 2012-07-04 MED ORDER — INSULIN ASPART 100 UNIT/ML ~~LOC~~ SOLN
20.0000 [IU] | Freq: Once | SUBCUTANEOUS | Status: AC
  Administered 2012-07-04: 20 [IU] via SUBCUTANEOUS

## 2012-07-04 MED ORDER — INSULIN ASPART 100 UNIT/ML ~~LOC~~ SOLN
0.0000 [IU] | Freq: Every day | SUBCUTANEOUS | Status: DC
  Administered 2012-07-04: 4 [IU] via SUBCUTANEOUS

## 2012-07-04 MED ORDER — CARBAMIDE PEROXIDE 6.5 % OT SOLN
5.0000 [drp] | Freq: Two times a day (BID) | OTIC | Status: DC
  Administered 2012-07-04 – 2012-07-06 (×4): 5 [drp] via OTIC
  Filled 2012-07-04: qty 15

## 2012-07-04 MED ORDER — INSULIN ASPART 100 UNIT/ML ~~LOC~~ SOLN
10.0000 [IU] | Freq: Three times a day (TID) | SUBCUTANEOUS | Status: DC
  Administered 2012-07-04 – 2012-07-06 (×6): 10 [IU] via SUBCUTANEOUS

## 2012-07-04 NOTE — Progress Notes (Signed)
Subjective: Interval History: has no complaint of nausea or vomiting. Overall she says she's feeling better..  Objective: Vital signs in last 24 hours: Temp:  [97.8 F (36.6 C)-99.1 F (37.3 C)] 97.8 F (36.6 C) (02/08 0804) Pulse Rate:  [56-166] 156 (02/08 0800) Resp:  [15-27] 20 (02/08 0800) BP: (97-184)/(52-116) 145/82 mmHg (02/08 0800) SpO2:  [90 %-100 %] 98 % (02/08 0800) Weight:  [138.3 kg (304 lb 14.3 oz)-142 kg (313 lb 0.9 oz)] 138.3 kg (304 lb 14.3 oz) (02/08 0500) Weight change: -0.7 kg (-1 lb 8.7 oz)  Intake/Output from previous day: 02/07 0701 - 02/08 0700 In: 1781 [P.O.:1320; I.V.:455; IV Piggyback:6] Out: 4004 [Urine:4000; Stool:4] Intake/Output this shift: Total I/O In: 260 [P.O.:240; I.V.:20] Out: -   Generally patient is alert no apparent distress Chest decreased breath sound bilaterally and she has some expiratory wheezing. Heart irregular rate and rhythm. Abdomen soft full nontender Extremities she has still 1-2+ edema and also erythema of her legs.  Lab Results:  Recent Labs  07/03/12 0509 07/04/12 0445  WBC 10.2 10.4  HGB 9.1* 9.3*  HCT 30.6* 31.1*  PLT 231 235   BMET:  Recent Labs  07/03/12 0509 07/04/12 0445  NA 136 137  K 3.7 3.8  CL 98 96  CO2 29 33*  GLUCOSE 319* 272*  BUN 13 16  CREATININE 1.30* 1.28*  CALCIUM 8.4 8.8   No results found for this basename: PTH,  in the last 72 hours Iron Studies: No results found for this basename: IRON, TIBC, TRANSFERRIN, FERRITIN,  in the last 72 hours  Studies/Results: Dg Chest Port 1 View  07/03/2012  *RADIOLOGY REPORT*  Clinical Data: CHF, morbid obesity  PORTABLE CHEST - 1 VIEW  Comparison: Portable exam 1451 hours compared to 06/27/2012  Findings: Rotation to the left. Enlargement of cardiac silhouette. Mediastinal contours and pulmonary vascularity normal. Question tortuous thoracic aorta. Bibasilar atelectasis. Upper lungs clear. No gross pleural effusion or pneumothorax. Prior cervical  spine fusion.  IMPRESSION: Enlargement of cardiac silhouette. Bibasilar atelectasis.   Original Report Authenticated By: Ulyses Southward, M.D.     I have reviewed the patient's current medications.  Assessment/Plan: Problem #1 acute kidney injury her BUN is 18 creatinine is 1.28 renal function seems to be returning to her baseline. Problem #2 hypokalemia potassium is corrected Problem #3 anasarca patient on Lasix with good urine output and also progressively improving. Problem #4 diabetes Problem #5 coronary artery disease she is a symptomatic. Problem #6 morbid obesity Problem #7 DVT. Problem #8 a trial fibrillation her heart rate today seems to be better. Plan: DC Lasix We'll start her on Demadex 40 mg by mouth daily We'll start her on Aldactone 25 mg once a day. We'll check her basic metabolic panel and her potassium is stable we'll DC potassium. If her patient is going to discharged out for her as an outpatient.    LOS: 7 days   Hartwell Vandiver S 07/04/2012,8:08 AM

## 2012-07-04 NOTE — Progress Notes (Signed)
[facility] PROGRESS NOTE  Ann [name] JYN:[id number] DOB: 31-Dec-1945 DOA: 06/27/2012 PCP: [name], MD  Assessment/Plan:  Atrial fibrillation with RVR. Patient went into new onset rapid atrial fibrillation requiring transfer to the step down unit.  She was asymptomatic through this.  She was started on a cardizem infusion for heart rate control and since then has been weaned off. She was transitioned to oral verapamil and digoxin and heart rate is improving.  She does have episodes of tachycardia, but overall heart rate is better today. Will titrate up verapamil Echo done yesterday shows hyperdynamic EF.  Cardiology following. She is anticoagulated on xarelto.   Acute encephalopathy. Resolved. Appears at baseline today. No further work up needed per neurology.   Urinary tract infection. Rocephin day #7 of 7. Urine culture reveals no growth. Reports less burning on urination. Will d/c rocephin today.   Chronic ethmoid and frontal sinusitis. Possible mastoiditis. Much improved. Continue  steroid nasal spray and as needed nasal saline.   Anemia with associated vitamin B12 deficiency. Anemia panel from 06/13/2012 revealed vitamin B12 low at 206, normal folate at 6.5, total iron mildly low at 20, normal TIBC of 351, low-normal ferritin of 42, and normal total reticulocyte count of 95.   Patient has been receiving IV iron per nephrology and received 1 unit PRBC  for symptomatic anemia. Her hemoglobin has responded appropriately.  Hemoccult stools are negative.    Sigmoid diverticulosis and questionable rectal polyp. Per barium enema study on 06/08/2012 ordered by GI Dr. Russella Dar. Apparently, the patient was too high risk for colonoscopy per family.    Acute renal failure, superimposed on chronic kidney disease, likely stage III.  It is likely she has stage III chronic kidney disease secondary to a combination of hypertension and diabetes mellitus. Creatinine stable. Current level in baseline range.  Her diuretics have been changed to oral demadex and aldactone. Follow renal recommendations.   Hypokalemia. Resolved following potassium chloride supplementation. Magnesium level is within normal limits. Hypokalemia may be secondary to chronic diuretic therapy.    History of left lower extremity DVT. She is anticoagulated with rivaroxaban. She was diagnosed with left lower extremity DVT per ultrasound in November 2013. Pharmacy consultation/evaluation noted and appreciated.   Bilateral lower extremity edema with erythema. The edema is likely multifactorial. It is likely that her LE erythema is due to venous stasis than an infective process.  Antibiotics were discontinued. Diuretic therapy was started by Dr. Kristian Covey.    Diabetes mellitus, type II, with nephropathy and probable neuropathy. Her CBGs were trending upward, likely secondary to increased steroids. Lantus and sliding scale NovoLog adjusted. Hemoglobin A1c is 7.5.     COPD. query obstructive sleep apnea. She had significant wheezing yesterday so her prednisone dose was increased. Her breathing is improving today.  Continue current dose of steroids for today, can consider tapering tomorrow.  Continue nebs. Will likely need home oxygen.  CAD/hypertension. No complaints CP. Troponin I is negative x2. BP controlled. SBP range 97-122. Continue current regimen.   Hypothyroidism. We'll continue Synthroid. TSH is mildly elevated, but her free T4 is within normal limits. Will maintain Synthroid at the same dose.   Chronic adrenal insufficiency. On chronic prednisone. The patient was given to stress doses of IV hydrocortisone 06/29/12. Restarted prednisone 06/30/12, but at a slightly higher dose. When she is stable for discharge, Anticipate decreasing the dose back to 5 mg daily.   Morbid obesity and deconditioning. Home health therapy recommended by the therapists.  Cerumen impaction.  This could be the cause of her difficulty with hearing.  If this  does not improve, then we may need to consider toxicity of loop diuretics.   Code Status: full Family Communication: husband at bedside Disposition Plan: home when ready,   Consultants:  Neurology, Dr. Gerilyn Pilgrim  Nephrology, Dr. Elveria Royals [facility]  Procedures:  none  Antibiotics:  Rocephin 06/27/12>>>07/03/12  Vancomycin 06/28/12>>>>07/02/12  HPI/Subjective: She is feeling better.  She does not feel any significant shortness of breath.  She does report some difficulty hearing out of both ears.  Objective: Filed Vitals:   07/04/12 0804 07/04/12 0815 07/04/12 0830 07/04/12 0845  BP:   144/87   Pulse:  102 89 73  Temp: 97.8 F (36.6 C)     TempSrc: Oral     Resp:  18 20 22   Height:      Weight:      SpO2:  98% 94% 95%    Intake/Output Summary (Last 24 hours) at 07/04/12 0933 Last data filed at 07/04/12 0800  Gross per 24 hour  Intake   1731 ml  Output   4003 ml  Net  -2272 ml   Filed Weights   07/03/12 0500 07/04/12 0000 07/04/12 0500  Weight: 142.7 kg (314 lb 9.5 oz) 142 kg (313 lb 0.9 oz) 138.3 kg (304 lb 14.3 oz)    Exam:   General:  Awake morbidly obese, NAD  HEENT:  Cerumen impaction in both ears.  Cardiovascular: RRR 1-2+LEE, improving.  PPP  Respiratory: increased work of breathing from walking to chair. BS with fair air flow, bilateral faint wheezing throughout.   Abdomen: obese soft  +BS non-tender to  palpation  Data Reviewed: Basic Metabolic Panel:  Recent Labs Lab 06/28/12 0806  06/30/12 0524 07/01/12 0528 07/02/12 0536 07/03/12 0509 07/04/12 0445  NA  --   < > 138 140 140 136 137  K  --   < > 4.4 4.0 3.7 3.7 3.8  CL  --   < > 105 105 103 98 96  CO2  --   < > 26 28 29 29  33*  GLUCOSE  --   < > 248* 225* 240* 319* 272*  BUN  --   < > 15 14 12 13 16   CREATININE  --   < > 1.62* 1.59* 1.49* 1.30* 1.28*  CALCIUM 7.7*  < > 8.0* 8.3* 8.3* 8.4 8.8  MG 1.8  --   --   --   --   --   --   PHOS  --   --   --   --   --  2.4  --   <  > = values in this interval not displayed. Liver Function Tests:  Recent Labs Lab 06/27/12 1854 06/28/12 0222  AST 19 16  ALT 38* 33  ALKPHOS 90 75  BILITOT 0.6 0.4  PROT 6.6 5.8*  ALBUMIN 3.0* 2.6*    Recent Labs Lab 06/27/12 1854  LIPASE 30    Recent Labs Lab 06/27/12 2014 06/29/12 0934  AMMONIA 34 15   CBC:  Recent Labs Lab 06/27/12 1854  06/30/12 0524 07/01/12 0528 07/02/12 0536 07/03/12 0509 07/04/12 0445  WBC 12.2*  < > 7.9 8.4 7.2 10.2 10.4  NEUTROABS 9.6*  --   --   --   --   --   --   HGB 9.2*  < > 7.7* 7.9* 7.6* 9.1* 9.3*  HCT 30.8*  < > 26.9* 27.4* 26.5* 30.6* 31.1*  MCV 83.0  < > 85.4 85.6 86.3 84.8 86.1  PLT 224  < > 207 222 215 231 235  < > = values in this interval not displayed. Cardiac Enzymes:  Recent Labs Lab 06/27/12 2014 06/28/12 0222 06/28/12 0806  TROPONINI <0.30 <0.30 <0.30   BNP (last 3 results)  Recent Labs  07/03/12 0509  PROBNP 1534.0*   CBG:  Recent Labs Lab 07/03/12 0744 07/03/12 1117 07/03/12 1622 07/03/12 2058 07/04/12 0756  GLUCAP 293* 329* 397* 334* 217*    Recent Results (from the past 240 hour(s))  URINE CULTURE     Status: None   Collection Time    06/27/12  7:43 PM      Result Value Range Status   Specimen Description URINE, CATHETERIZED   Final   Special Requests NONE   Final   Culture  Setup Time 06/28/2012 20:42   Final   Colony Count NO GROWTH   Final   Culture NO GROWTH   Final   Report Status 06/29/2012 FINAL   Final  URINE CULTURE     Status: None   Collection Time    06/28/12  9:17 AM      Result Value Range Status   Specimen Description URINE, CLEAN CATCH   Final   Special Requests NONE   Final   Culture  Setup Time 06/28/2012 20:42   Final   Colony Count NO GROWTH   Final   Culture NO GROWTH   Final   Report Status 06/29/2012 FINAL   Final  MRSA PCR SCREENING     Status: Abnormal   Collection Time    07/02/12  2:15 PM      Result Value Range Status   MRSA by PCR POSITIVE  (*) NEGATIVE Final   Comment:            The GeneXpert MRSA Assay (FDA     approved for NASAL specimens     only), is one component of a     comprehensive MRSA colonization     surveillance program. It is not     intended to diagnose MRSA     infection nor to guide or     monitor treatment for     MRSA infections.     RESULT CALLED TO, READ BACK BY AND VERIFIED WITH:     PHILLIPS,C. AT 1740 ON 07/02/2012 BY BAUGHAM,M.     Studies: Dg Chest Port 1 View  07/03/2012  *RADIOLOGY REPORT*  Clinical Data: CHF, morbid obesity  PORTABLE CHEST - 1 VIEW  Comparison: Portable exam 1451 hours compared to 06/27/2012  Findings: Rotation to the left. Enlargement of cardiac silhouette. Mediastinal contours and pulmonary vascularity normal. Question tortuous thoracic aorta. Bibasilar atelectasis. Upper lungs clear. No gross pleural effusion or pneumothorax. Prior cervical spine fusion.  IMPRESSION: Enlargement of cardiac silhouette. Bibasilar atelectasis.   Original Report Authenticated By: Ulyses Southward, M.D.     Scheduled Meds: . aspirin EC  81 mg Oral Daily  . benzonatate  100 mg Oral TID  . calcitRIOL  0.25 mcg Oral Daily  . Chlorhexidine Gluconate Cloth  6 each Topical Q0600  . digoxin  0.125 mg Oral Daily  . fluticasone  1 spray Each Nare Daily  . gabapentin  100 mg Oral TID  . insulin aspart  0-20 Units Subcutaneous TID WC  . insulin aspart  0-5 Units Subcutaneous QHS  . insulin aspart  6 Units Subcutaneous TID WC  . insulin glargine  40 Units  Subcutaneous QHS  . ipratropium  0.5 mg Nebulization Q6H  . levalbuterol  0.63 mg Nebulization Q6H  . levothyroxine  200 mcg Oral QAC breakfast  . mometasone-formoterol  2 puff Inhalation BID  . mupirocin ointment  1 application Nasal BID  . pantoprazole  40 mg Oral Daily  . polyethylene glycol  17 g Oral Daily  . potassium chloride  20 mEq Oral BID  . predniSONE  40 mg Oral Q breakfast  . Rivaroxaban  20 mg Oral Daily  . senna-docusate  1 tablet  Oral BID  . sertraline  50 mg Oral Daily  . torsemide  20 mg Oral Daily  . verapamil  80 mg Oral Q8H  . vitamin B-12  1,000 mcg Oral Daily   Continuous Infusions: . sodium chloride 20 mL/hr at 07/04/12 0454    Principal Problem:   Acute encephalopathy Active Problems:   HYPOTHYROIDISM   HYPERTENSION   CAD   Diabetes mellitus type 2 with complications   Adrenal insufficiency   DVT (deep venous thrombosis)   ARF (acute renal failure)   UTI (lower urinary tract infection)   Chronic anticoagulation   Hypokalemia   Shortness of breath   Vitamin B12 deficiency   Sinusitis chronic, frontal   Bilateral lower extremity edema   Left leg cellulitis   Morbid obesity   Chronic kidney disease, stage III (moderate)   Lethargy   Atrial fibrillation with RVR    Time spent: 30 minutes    MEMON,JEHANZEB  [facility]  If 8PM-8AM, please contact night-coverage at [email or web address], password [facility] 07/04/2012, 9:33 AM  LOS: 7 days

## 2012-07-04 NOTE — Progress Notes (Signed)
Dr. Kerry Hough informed about patient CBG being 447, new orders noted.

## 2012-07-05 LAB — BASIC METABOLIC PANEL
Calcium: 9.1 mg/dL (ref 8.4–10.5)
Chloride: 96 mEq/L (ref 96–112)
Creatinine, Ser: 1.23 mg/dL — ABNORMAL HIGH (ref 0.50–1.10)
GFR calc Af Amer: 52 mL/min — ABNORMAL LOW (ref >90)

## 2012-07-05 LAB — GLUCOSE, CAPILLARY
Glucose-Capillary: 278 mg/dL — ABNORMAL HIGH (ref 70–99)
Glucose-Capillary: 378 mg/dL — ABNORMAL HIGH (ref 70–99)
Glucose-Capillary: 402 mg/dL — ABNORMAL HIGH (ref 70–99)

## 2012-07-05 LAB — GLUCOSE, RANDOM: Glucose, Bld: 392 mg/dL — ABNORMAL HIGH (ref 70–99)

## 2012-07-05 LAB — CBC
Platelets: 216 10*3/uL (ref 150–400)
RDW: 19.5 % — ABNORMAL HIGH (ref 11.5–15.5)
WBC: 9.9 10*3/uL (ref 4.0–10.5)

## 2012-07-05 LAB — PHOSPHORUS: Phosphorus: 3 mg/dL (ref 2.3–4.6)

## 2012-07-05 MED ORDER — INSULIN ASPART 100 UNIT/ML ~~LOC~~ SOLN
20.0000 [IU] | Freq: Once | SUBCUTANEOUS | Status: AC
  Administered 2012-07-05: 20 [IU] via SUBCUTANEOUS

## 2012-07-05 MED ORDER — POLYSACCHARIDE IRON COMPLEX 150 MG PO CAPS
150.0000 mg | ORAL_CAPSULE | Freq: Two times a day (BID) | ORAL | Status: DC
  Administered 2012-07-05 – 2012-07-06 (×3): 150 mg via ORAL
  Filled 2012-07-05 (×3): qty 1

## 2012-07-05 NOTE — Progress Notes (Addendum)
PT BEING TRANSFERRED TO ROOM 316. REPORT CALLED TO CINDY RN ON 300. PT ALERT AND ORIENTED. O2 AT 2L/MIN VIA [place]. BREATH SOUNDS DIMINISHED. O2 AT 98%. RT FOREARM IV PATENT. FO.LEY CATHETER PATENT. LLE IS RED FROM KNEE DOWN W/ 2+ EDEMA. RLE HAS NO REDNESS, BUT 2+ EDEMA. CBG 278. RN TO COVER W/ INSULIN BEFORE PT EATS. HR 70'S TO 80'S IN NSR, ALTERNATING W/ ATRIAL FIB. VVS. FAMILY MEMBERS AT BEDSIDE.

## 2012-07-05 NOTE — Progress Notes (Signed)
[facility] PROGRESS NOTE  Ann [name] ZOX:[id number] DOB: 1945/10/29 DOA: 06/27/2012 PCP: [name], MD  Assessment/Plan:  Atrial fibrillation with RVR. Patient went into new onset rapid atrial fibrillation requiring transfer to the step down unit.  She was asymptomatic through this.  She was started on a cardizem infusion for heart rate control and since then has been weaned off. She was transitioned to oral verapamil and digoxin and heart rate is improving.  Heart rate is better controlled today ranging mostly in 70s-80s. Echo shows hyperdynamic EF.  Cardiology following. She is anticoagulated on xarelto.   Acute encephalopathy. Resolved. Appears at baseline today. No further work up needed per neurology.   Urinary tract infection. Completed a course of rocephin. Urine culture reveals no growth. Reports less burning on urination.   Chronic ethmoid and frontal sinusitis. Possible mastoiditis. Much improved. Continue  steroid nasal spray and as needed nasal saline.   Anemia with associated vitamin B12 deficiency. Anemia panel from 06/13/2012 revealed vitamin B12 low at 206, normal folate at 6.5, total iron mildly low at 20, normal TIBC of 351, low-normal ferritin of 42, and normal total reticulocyte count of 95.   Patient has been receiving IV iron per nephrology and received 1 unit PRBC  for symptomatic anemia. Her hemoglobin has responded appropriately.  Hemoccult stools are negative.    Sigmoid diverticulosis and questionable rectal polyp. Per barium enema study on 06/08/2012 ordered by GI Dr. Russella Dar. Apparently, the patient was too high risk for colonoscopy per family.    Acute renal failure, superimposed on chronic kidney disease, likely stage III.  It is likely she has stage III chronic kidney disease secondary to a combination of hypertension and diabetes mellitus. Creatinine stable. Current level in baseline range. Her diuretics have been changed to oral demadex and aldactone.    Hypokalemia. Resolved following potassium chloride supplementation. Magnesium level is within normal limits. Hypokalemia may be secondary to chronic diuretic therapy.    History of left lower extremity DVT. She is anticoagulated with rivaroxaban. She was diagnosed with left lower extremity DVT per ultrasound in November 2013. Pharmacy consultation/evaluation noted and appreciated.   Bilateral lower extremity edema with erythema. The edema is likely multifactorial. It is likely that her LE erythema is due to venous stasis than an infective process.  Antibiotics were discontinued. Diuretic therapy was started by Dr. Kristian Covey.    Diabetes mellitus, type II, with nephropathy and probable neuropathy. Her CBGs were trending upward, likely secondary to increased steroids. Lantus and sliding scale NovoLog adjusted. Hemoglobin A1c is 7.5.     COPD. query obstructive sleep apnea. Patient has wheezing on exam today.  I suspect that she has a chronic wheeze.  She reports breathing comfortably.  Continue current dose of steroids for today, can consider tapering tomorrow.  Continue nebs. Will likely need home oxygen.  CAD/hypertension. No complaints CP. Troponin I is negative x2. BP controlled. Continue current regimen.   Hypothyroidism. We'll continue Synthroid. TSH is mildly elevated, but her free T4 is within normal limits. Will maintain Synthroid at the same dose.   Chronic adrenal insufficiency. On chronic prednisone. The patient was given to stress doses of IV hydrocortisone 06/29/12. Restarted prednisone 06/30/12, but at a slightly higher dose. When she is stable for discharge, Anticipate decreasing the dose back to 5 mg daily.   Morbid obesity and deconditioning. Home health therapy recommended by the therapists.  Cerumen impaction. This could be the cause of her difficulty with hearing.  If this does not  improve, then we may need to consider toxicity of loop diuretics.   Code Status: full Family  Communication: husband at bedside Disposition Plan: home when ready, transfer to telemetry today and mobilize patient   Consultants:  Neurology, Dr. Gerilyn Pilgrim  Nephrology, Dr. Elveria Royals [facility]  Procedures:  none  Antibiotics:  Rocephin 06/27/12>>>07/03/12  Vancomycin 06/28/12>>>>07/02/12  HPI/Subjective: Feels better today. No complaints of shortness of breath  Objective: Filed Vitals:   07/05/12 0703 07/05/12 0800 07/05/12 0900 07/05/12 1000  BP:  148/75 128/70 130/57  Pulse:  79 94 81  Temp:  98.5 F (36.9 C)    TempSrc:  Oral    Resp:  20 18 22   Height:      Weight:      SpO2: 94% 95% 95% 97%    Intake/Output Summary (Last 24 hours) at 07/05/12 1031 Last data filed at 07/05/12 1000  Gross per 24 hour  Intake   1320 ml  Output   2350 ml  Net  -1030 ml   Filed Weights   07/04/12 0000 07/04/12 0500 07/05/12 0500  Weight: 142 kg (313 lb 0.9 oz) 138.3 kg (304 lb 14.3 oz) 141.1 kg (311 lb 1.1 oz)    Exam:   General:  Awake morbidly obese, NAD  HEENT:  Cerumen impaction in both ears.  Cardiovascular: irregular rate and rhythm, 1-2+LEE, improving.  PPP  Respiratory: increased work of breathing from walking to chair. BS with fair air flow, bilateral faint wheezing throughout.   Abdomen: obese soft  +BS non-tender to  palpation  Data Reviewed: Basic Metabolic Panel:  Recent Labs Lab 07/01/12 0528 07/02/12 0536 07/03/12 0509 07/04/12 0445 07/05/12 0449  NA 140 140 136 137 137  K 4.0 3.7 3.7 3.8 4.3  CL 105 103 98 96 96  CO2 28 29 29  33* 34*  GLUCOSE 225* 240* 319* 272* 259*  BUN 14 12 13 16 20   CREATININE 1.59* 1.49* 1.30* 1.28* 1.23*  CALCIUM 8.3* 8.3* 8.4 8.8 9.1  PHOS  --   --  2.4  --  3.0   Liver Function Tests: No results found for this basename: AST, ALT, ALKPHOS, BILITOT, PROT, ALBUMIN,  in the last 168 hours No results found for this basename: LIPASE, AMYLASE,  in the last 168 hours  Recent Labs Lab 06/29/12 0934  AMMONIA  15   CBC:  Recent Labs Lab 07/01/12 0528 07/02/12 0536 07/03/12 0509 07/04/12 0445 07/05/12 0449  WBC 8.4 7.2 10.2 10.4 9.9  HGB 7.9* 7.6* 9.1* 9.3* 8.9*  HCT 27.4* 26.5* 30.6* 31.1* 29.7*  MCV 85.6 86.3 84.8 86.1 87.4  PLT 222 215 231 235 216   Cardiac Enzymes: No results found for this basename: CKTOTAL, CKMB, CKMBINDEX, TROPONINI,  in the last 168 hours BNP (last 3 results)  Recent Labs  07/03/12 0509  PROBNP 1534.0*   CBG:  Recent Labs Lab 07/04/12 1613 07/04/12 1615 07/04/12 1905 07/04/12 2135 07/05/12 0739  GLUCAP 453* 447* 426* 327* 211*    Recent Results (from the past 240 hour(s))  URINE CULTURE     Status: None   Collection Time    06/27/12  7:43 PM      Result Value Range Status   Specimen Description URINE, CATHETERIZED   Final   Special Requests NONE   Final   Culture  Setup Time 06/28/2012 20:42   Final   Colony Count NO GROWTH   Final   Culture NO GROWTH   Final   Report Status  06/29/2012 FINAL   Final  URINE CULTURE     Status: None   Collection Time    06/28/12  9:17 AM      Result Value Range Status   Specimen Description URINE, CLEAN CATCH   Final   Special Requests NONE   Final   Culture  Setup Time 06/28/2012 20:42   Final   Colony Count NO GROWTH   Final   Culture NO GROWTH   Final   Report Status 06/29/2012 FINAL   Final  MRSA PCR SCREENING     Status: Abnormal   Collection Time    07/02/12  2:15 PM      Result Value Range Status   MRSA by PCR POSITIVE (*) NEGATIVE Final   Comment:            The GeneXpert MRSA Assay (FDA     approved for NASAL specimens     only), is one component of a     comprehensive MRSA colonization     surveillance program. It is not     intended to diagnose MRSA     infection nor to guide or     monitor treatment for     MRSA infections.     RESULT CALLED TO, READ BACK BY AND VERIFIED WITH:     PHILLIPS,C. AT 1740 ON 07/02/2012 BY BAUGHAM,M.     Studies: Dg Chest Port 1 View  07/03/2012   *RADIOLOGY REPORT*  Clinical Data: CHF, morbid obesity  PORTABLE CHEST - 1 VIEW  Comparison: Portable exam 1451 hours compared to 06/27/2012  Findings: Rotation to the left. Enlargement of cardiac silhouette. Mediastinal contours and pulmonary vascularity normal. Question tortuous thoracic aorta. Bibasilar atelectasis. Upper lungs clear. No gross pleural effusion or pneumothorax. Prior cervical spine fusion.  IMPRESSION: Enlargement of cardiac silhouette. Bibasilar atelectasis.   Original Report Authenticated By: Ulyses Southward, M.D.     Scheduled Meds: . aspirin EC  81 mg Oral Daily  . benzonatate  100 mg Oral TID  . calcitRIOL  0.25 mcg Oral Daily  . carbamide peroxide  5 drop Both Ears BID  . Chlorhexidine Gluconate Cloth  6 each Topical Q0600  . digoxin  0.125 mg Oral Daily  . fluticasone  1 spray Each Nare Daily  . gabapentin  100 mg Oral TID  . insulin aspart  0-20 Units Subcutaneous TID WC  . insulin aspart  0-5 Units Subcutaneous QHS  . insulin aspart  10 Units Subcutaneous TID WC  . insulin glargine  50 Units Subcutaneous QHS  . ipratropium  0.5 mg Nebulization Q6H  . iron polysaccharides  150 mg Oral BID  . levalbuterol  0.63 mg Nebulization Q6H  . levothyroxine  200 mcg Oral QAC breakfast  . mometasone-formoterol  2 puff Inhalation BID  . mupirocin ointment  1 application Nasal BID  . pantoprazole  40 mg Oral Daily  . polyethylene glycol  17 g Oral Daily  . predniSONE  40 mg Oral Q breakfast  . Rivaroxaban  20 mg Oral Daily  . senna-docusate  1 tablet Oral BID  . sertraline  50 mg Oral Daily  . torsemide  20 mg Oral Daily  . verapamil  80 mg Oral Q6H  . vitamin B-12  1,000 mcg Oral Daily   Continuous Infusions: . sodium chloride 20 mL/hr at 07/05/12 1000    Principal Problem:   Acute encephalopathy Active Problems:   HYPOTHYROIDISM   HYPERTENSION   CAD   Diabetes mellitus type  2 with complications   Adrenal insufficiency   DVT (deep venous thrombosis)   ARF (acute  renal failure)   UTI (lower urinary tract infection)   Chronic anticoagulation   Hypokalemia   Shortness of breath   Vitamin B12 deficiency   Sinusitis chronic, frontal   Bilateral lower extremity edema   Left leg cellulitis   Morbid obesity   Chronic kidney disease, stage III (moderate)   Lethargy   Atrial fibrillation with RVR    Time spent: 30 minutes    MEMON,JEHANZEB  [facility]  If 8PM-8AM, please contact night-coverage at [email or web address], password [facility] 07/05/2012, 10:31 AM  LOS: 8 days

## 2012-07-05 NOTE — Progress Notes (Signed)
Subjective: Interval History: has no complaint of nausea or vomiting. Patient denies also any difficulty in breathing..  Objective: Vital signs in last 24 hours: Temp:  [97.4 F (36.3 C)-98.5 F (36.9 C)] 98.5 F (36.9 C) (02/09 0800) Pulse Rate:  [63-191] 79 (02/09 0800) Resp:  [14-24] 20 (02/09 0800) BP: (101-148)/(48-111) 148/75 mmHg (02/09 0800) SpO2:  [92 %-100 %] 95 % (02/09 0800) Weight:  [141.1 kg (311 lb 1.1 oz)] 141.1 kg (311 lb 1.1 oz) (02/09 0500) Weight change: -0.9 kg (-1 lb 15.8 oz)  Intake/Output from previous day: 02/08 0701 - 02/09 0700 In: 2000 [P.O.:720; I.V.:480] Out: 2001 [Urine:2000; Stool:1] Intake/Output this shift: Total I/O In: 380 [P.O.:360; I.V.:20] Out: -   General appearance: alert, cooperative and no distress Resp: clear to auscultation bilaterally Cardio: regular rate and rhythm, S1, S2 normal, no murmur, click, rub or gallop GI: soft, non-tender; bowel sounds normal; no masses,  no organomegaly Extremities: extremities normal, atraumatic, no cyanosis or edema  Lab Results:  Recent Labs  07/04/12 0445 07/05/12 0449  WBC 10.4 9.9  HGB 9.3* 8.9*  HCT 31.1* 29.7*  PLT 235 216   BMET:  Recent Labs  07/04/12 0445 07/05/12 0449  NA 137 137  K 3.8 4.3  CL 96 96  CO2 33* 34*  GLUCOSE 272* 259*  BUN 16 20  CREATININE 1.28* 1.23*  CALCIUM 8.8 9.1   No results found for this basename: PTH,  in the last 72 hours Iron Studies: No results found for this basename: IRON, TIBC, TRANSFERRIN, FERRITIN,  in the last 72 hours  Studies/Results: Dg Chest Port 1 View  07/03/2012  *RADIOLOGY REPORT*  Clinical Data: CHF, morbid obesity  PORTABLE CHEST - 1 VIEW  Comparison: Portable exam 1451 hours compared to 06/27/2012  Findings: Rotation to the left. Enlargement of cardiac silhouette. Mediastinal contours and pulmonary vascularity normal. Question tortuous thoracic aorta. Bibasilar atelectasis. Upper lungs clear. No gross pleural effusion or  pneumothorax. Prior cervical spine fusion.  IMPRESSION: Enlargement of cardiac silhouette. Bibasilar atelectasis.   Original Report Authenticated By: Ulyses Southward, M.D.     I have reviewed the patient's current medications.  Assessment/Plan: Problem #1 renal failure. Her BUN is 16 creatinine is 1.28 renal function has returned to her baseline. Problem #2 hypokalemia her potassium is 4.3 has corrected. Patient presently on Aldactone and also potassium supplements. Problem #3 anemia iron deficiency her hemoglobin 8.9 hematocrit 29.7 stable Problem #4 history of DVT Problem #5 hypertension blood pressure seems to be reasonably controlled Problem #6 history of hypothyroidism Problem #7 Atrial fibrillation. Problem #8 metabolic bone disease calcium and phosphorus is in acceptable range. Patient is on Rocaltrol. Plan: DC potassium supplement We'll start him on Nu-Iron 150 mg by mouth twice a day Since her renal function has returned to baseline her signoff and follow her as an outpatient.    LOS: 8 days   Cristin Szatkowski S 07/05/2012,8:29 AM

## 2012-07-06 LAB — BASIC METABOLIC PANEL
CO2: 32 mEq/L (ref 19–32)
Calcium: 9.4 mg/dL (ref 8.4–10.5)
Creatinine, Ser: 1.04 mg/dL (ref 0.50–1.10)
GFR calc non Af Amer: 55 mL/min — ABNORMAL LOW (ref >90)
Glucose, Bld: 288 mg/dL — ABNORMAL HIGH (ref 70–99)
Sodium: 137 mEq/L (ref 135–145)

## 2012-07-06 LAB — CBC
MCH: 26.5 pg (ref 26.0–34.0)
MCHC: 29.9 g/dL — ABNORMAL LOW (ref 30.0–36.0)
MCV: 88.5 fL (ref 78.0–100.0)
Platelets: 223 10*3/uL (ref 150–400)
RBC: 3.4 MIL/uL — ABNORMAL LOW (ref 3.87–5.11)

## 2012-07-06 LAB — GLUCOSE, CAPILLARY: Glucose-Capillary: 254 mg/dL — ABNORMAL HIGH (ref 70–99)

## 2012-07-06 MED ORDER — PREDNISONE 20 MG PO TABS
30.0000 mg | ORAL_TABLET | Freq: Every day | ORAL | Status: DC
  Filled 2012-07-06: qty 1

## 2012-07-06 MED ORDER — IPRATROPIUM BROMIDE 0.02 % IN SOLN: 0.5000 mg | Freq: Four times a day (QID) | RESPIRATORY_TRACT | Status: DC

## 2012-07-06 MED ORDER — PREDNISONE 10 MG PO TABS: ORAL_TABLET | ORAL | Status: DC

## 2012-07-06 MED ORDER — CALCITRIOL 0.25 MCG PO CAPS: 0.2500 ug | ORAL_CAPSULE | Freq: Every day | ORAL | Status: DC

## 2012-07-06 MED ORDER — LEVALBUTEROL HCL 0.63 MG/3ML IN NEBU: 0.6300 mg | INHALATION_SOLUTION | Freq: Four times a day (QID) | RESPIRATORY_TRACT | Status: DC

## 2012-07-06 MED ORDER — SALINE SPRAY 0.65 % NA SOLN: 1.0000 | NASAL | Status: DC | PRN

## 2012-07-06 MED ORDER — CYANOCOBALAMIN 1000 MCG PO TABS: 1000.0000 ug | ORAL_TABLET | Freq: Every day | ORAL | Status: DC

## 2012-07-06 MED ORDER — PREDNISONE 5 MG PO TABS: 5.0000 mg | ORAL_TABLET | Freq: Every day | ORAL | Status: DC

## 2012-07-06 MED ORDER — INSULIN GLARGINE 100 UNIT/ML ~~LOC~~ SOLN: 60.0000 [IU] | Freq: Every day | SUBCUTANEOUS | Status: DC

## 2012-07-06 MED ORDER — POLYSACCHARIDE IRON COMPLEX 150 MG PO CAPS: 150.0000 mg | ORAL_CAPSULE | Freq: Two times a day (BID) | ORAL | Status: DC

## 2012-07-06 MED ORDER — VERAPAMIL HCL 80 MG PO TABS: 80.0000 mg | ORAL_TABLET | Freq: Four times a day (QID) | ORAL | Status: DC

## 2012-07-06 MED ORDER — CARBAMIDE PEROXIDE 6.5 % OT SOLN: 5.0000 [drp] | Freq: Two times a day (BID) | OTIC | Status: DC

## 2012-07-06 MED ORDER — PREDNISONE 20 MG PO TABS
30.0000 mg | ORAL_TABLET | Freq: Every day | ORAL | Status: DC
  Administered 2012-07-06: 30 mg via ORAL

## 2012-07-06 MED ORDER — INSULIN ASPART 100 UNIT/ML ~~LOC~~ SOLN: 10.0000 [IU] | Freq: Three times a day (TID) | SUBCUTANEOUS | Status: DC

## 2012-07-06 MED ORDER — INSULIN DETEMIR 100 UNIT/ML ~~LOC~~ SOLN: 60.0000 [IU] | Freq: Every day | SUBCUTANEOUS | Status: DC

## 2012-07-06 MED ORDER — POLYETHYLENE GLYCOL 3350 17 G PO PACK: 17.0000 g | PACK | Freq: Every day | ORAL | Status: DC

## 2012-07-06 MED ORDER — DIGOXIN 125 MCG PO TABS: 0.1250 mg | ORAL_TABLET | Freq: Every day | ORAL | Status: DC

## 2012-07-06 MED ORDER — TORSEMIDE 20 MG PO TABS: 20.0000 mg | ORAL_TABLET | Freq: Every day | ORAL | Status: DC

## 2012-07-06 MED ORDER — BENZONATATE 100 MG PO CAPS: 100.0000 mg | ORAL_CAPSULE | Freq: Three times a day (TID) | ORAL | Status: DC

## 2012-07-06 NOTE — Progress Notes (Signed)
SUBJECTIVE:Breathing better, having loose stools. Plans to go home today with HHN.  Principal Problem:   Acute encephalopathy Active Problems:   HYPOTHYROIDISM   HYPERTENSION   CAD   Diabetes mellitus type 2 with complications   Adrenal insufficiency   DVT (deep venous thrombosis)   ARF (acute renal failure)   UTI (lower urinary tract infection)   Chronic anticoagulation   Hypokalemia   Shortness of breath   Vitamin B12 deficiency   Sinusitis chronic, frontal   Bilateral lower extremity edema   Left leg cellulitis   Morbid obesity   Chronic kidney disease, stage III (moderate)   Lethargy   Atrial fibrillation with RVR  LABS: Basic Metabolic Panel:  Recent Labs  96/04/54 0449 07/05/12 2238 07/06/12 0445  NA 137  --  137  K 4.3  --  5.1  CL 96  --  98  CO2 34*  --  32  GLUCOSE 259* 392* 288*  BUN 20  --  23  CREATININE 1.23*  --  1.04  CALCIUM 9.1  --  9.4  PHOS 3.0  --   --    CBC:  Recent Labs  07/05/12 0449 07/06/12 0445  WBC 9.9 9.7  HGB 8.9* 9.0*  HCT 29.7* 30.1*  MCV 87.4 88.5  PLT 216 223   PHYSICAL EXAM BP 116/72  Pulse 76  Temp(Src) 98.4 F (36.9 C) (Oral)  Resp 22  Ht 5\' 4"  (1.626 m)  Wt 311 lb 1.1 oz (141.1 kg)  BMI 53.37 kg/m2  SpO2 94% General: Well developed, well nourished, in no acute distress Head: Eyes PERRLA, No xanthomas.   Normal cephalic and atramatic  Lungs: Mild expiratory wheezes. No rhonchi Heart: HRRR S1 S2, No MRG .  Pulses are 2+ & equal.            No carotid bruit. No JVD.  No abdominal bruits. No femoral bruits. Abdomen: Bowel sounds are positive, abdomen soft and non-tender without masses or                  Hernia's noted. Msk:  Back normal, normal gait. Normal strength and tone for age. Extremities: No clubbing, cyanosis or edema.  DP +1 Neuro: Alert and oriented X 3. Psych:  Good affect, responds appropriately  TELEMETRY: Reviewed telemetry pt in: NSR with PAC's. Rates in the 70's.   ASSESSMENT AND  PLAN:  1. New onset Atrial fib with RVR: Multifactorial in the setting of acute illness,physiologic stress, and discontinuation of metoprolol. Now on verapamil and digoxin with good rate control. Now in NSR with PAC;s. Remains on xarelto 20 mg daily. Creatinine 1.04. Follow up appointment with Dr. Daleen Squibb is made for 2 weeks in anticipation of discharge in the next 24 hours.  2. Fluid overload: Resolved presently. Mild LE likely from lymphedema int he setting of morbid obesity and sedentary lifestyle in addition to Addison's disease.. Remains on toresemide 20 mg daily. Weights inaccurate for diureses. Uncertain of dry wt.   3. Diabetes  4. UTI  5. DVT  6. CAD  7. Hypertension  8. Morbid Obesity  9. Acute Encephalopathy  9. Adrenal insufficieny  10.Hypothyroidism  Bettey Mare. Lyman Bishop NP [facility] 07/06/2012, 9:18 AM  Cardiology Attending Patient interviewed and examined. Discussed with Joni Reining, NP.  Above note annotated and modified based upon my findings.  Current medications are effectively managing atrial fibrillation and hypertension. Return office visit will be arranged.  [place] Bing, MD 07/08/2012, 12:11 PM

## 2012-07-06 NOTE — H&P (Deleted)
Physician Discharge Summary  Ann [name] XLK:[id number] DOB: 1945/11/09 DOA: 06/27/2012  PCP: [name], MD  Admit date: 06/27/2012 Discharge date: 07/06/2012  Time spent: 40 minutes  Recommendations for Outpatient Follow-up:  1. Follow up with Dr. Daleen Squibb. Has apt 07/21/12 2. HH RN to monitor CBG as have been trending high in hospital somewhat attributable to steroids. Insulin adjusted accordingly. 3. Patient will need outpatient sleep study. This can be arranged by primary care doctor   Discharge Diagnoses:  Principal Problem:   Acute encephalopathy Active Problems:   HYPOTHYROIDISM   HYPERTENSION   CAD   Diabetes mellitus type 2 with complications   Adrenal insufficiency   DVT (deep venous thrombosis)   ARF (acute renal failure)   UTI (lower urinary tract infection)   Chronic anticoagulation   Hypokalemia   Shortness of breath   Vitamin B12 deficiency   Sinusitis chronic, frontal   Bilateral lower extremity edema   Left leg cellulitis   Morbid obesity   Chronic kidney disease, stage III (moderate)   Lethargy   Atrial fibrillation with RVR   Discharge Condition: Stable and ready for discharge to home with [facility].  Diet recommendation: carb modified and heart healthy  Filed Weights   07/04/12 0500 07/05/12 0500 07/06/12 0457  Weight: 138.3 kg (304 lb 14.3 oz) 141.1 kg (311 lb 1.1 oz) 141.1 kg (311 lb 1.1 oz)    History of present illness:  Ann [name] is a 67 y.o. female with a past medical history of obesity, hypertension, DVT, on anticoagulation, hypothyroidism, COPD, who was recently hospitalized in January for acute renal failure presented to [facility] on 06/27/12 with cc confusion and decreased urination. On previous hospitalization she also complained of back pain, which prompted transfer to [facility] for MRI. MRI did not show any acute abnormalities. Patient subsequently, was discharged home. Family reported that 2 days prior to [facility]  confused. She was seeing things that were not there. She was talking about things from the past as if they were happening now. Had some chills at home and felt hot earlier today. Had been having difficulties with urination. Complained of burning with urination. Had been constipated for the prior 3 days. She also had some shortness of breath with a cough. She did  have chronic cough. She has chronic lower extremity edema. She was initiated on diuretics recently. She also has a history of adrenal insufficiency and is on steroids chronically. She also mentioned she had abdominal pain in the lower abdomen. Was unable to characterize any further. So, poor history because of patient's confusion.      Hospital Course:  Atrial fibrillation with RVR. Patient went into new onset rapid atrial fibrillation on 07/02/12, requiring transfer to the step down unit. She was asymptomatic through this. She was started on a cardizem infusion for heart rate control and since then has been weaned off. She was transitioned to oral verapamil and digoxin and heart rate controlled in 70s-80s. Echo shows hyperdynamic EF. Cardiology consulted. She is anticoagulated on xarelto. Will follow up with Dr Daleen Squibb 07/21/12. Acute encephalopathy. Likely related to UTI. Hydrated and given antibiotics. Seen by neurology. Resolved at baseline at discharge.  No further work up needed per neurology.  Urinary tract infection. Completed a course of rocephin. Urine culture reveals no growth. Reports less burning on urination.  Chronic ethmoid and frontal sinusitis. Possible mastoiditis. Much improved at discharge. Continue steroid nasal spray and as needed nasal saline.  Anemia with  associated vitamin B12 deficiency. Anemia panel from 06/13/2012 revealed vitamin B12 low at 206, normal folate at 6.5, total iron mildly low at 20, normal TIBC of 351, low-normal ferritin of 42, and normal total reticulocyte count of 95. Patient recieved IV iron per nephrology  and received 1 unit PRBC for symptomatic anemia. Her hemoglobin has responded appropriately. Hemoccult stools are negative. Recommend Op follow up. At discharge Hg stable at baseline 9.0. Sigmoid diverticulosis and questionable rectal polyp. Per barium enema study on 06/08/2012 ordered by GI Dr. Russella Dar. Apparently, the patient was too high risk for colonoscopy per family.  Acute renal failure, superimposed on chronic kidney disease, likely stage III. It is likely she has stage III chronic kidney disease secondary to a combination of hypertension and diabetes mellitus. Seen by nephrology. Creatinine stable. Current level in baseline range. Her diuretics have been changed to oral demadex. At discharge creatinine 1.04. Hypokalemia. Resolved following potassium chloride supplementation. Magnesium level is within normal limits. Hypokalemia may be secondary to chronic diuretic therapy.  History of left lower extremity DVT. She is anticoagulated with rivaroxaban. She was diagnosed with left lower extremity DVT per ultrasound in November 2013.   Bilateral lower extremity edema with erythema. The edema is likely multifactorial i.e. Lymphedema in setting of morbid obesity, sedentary lifestyle and addisons disease.  It is likely that her LE erythema is due to venous stasis than an infective process. Antibiotics were discontinued. Diuretic therapy was started by Dr. Kristian Covey. 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 Her CBGs were trending upward, likely secondary to increased steroids. Lantus and sliding scale NovoLog adjusted. Hemoglobin A1c is 7.5. Will be discharged on higher dose lantus, continue meal coverage. Will also have HH RN to assist with monitoring CBG's COPD. query obstructive sleep apnea. Patient had wheezing throughout hospitalization. Suspect that she has a chronic wheeze. She received nebs and steroids and gradually improved to baseline. At discharge sat 96% at rest  and with ambulation on room air. Will discharge with steroid taper. Continue nebs at home.  CAD/hypertension. No complaints CP. Troponin I is negative x2. BP controlled. Continue current regimen.  Hypothyroidism. We'll continue Synthroid. TSH is mildly elevated, but her free T4 is within normal limits. Will maintain Synthroid at the same dose. Recommend OP follow up.  Chronic adrenal insufficiency. On chronic prednisone. The patient was given to stress doses of IV hydrocortisone 06/29/12. Restarted prednisone 06/30/12, but at a slightly higher dose.  Will taper back to  5 mg daily.  Morbid obesity and deconditioning. Home health therapy recommended by the therapists.  Cerumen impaction. This could be the cause of her difficulty with hearing. Given drops. Recommend OP follow up.      Procedures:none Consultations:  Cardiology  Neurology  nephrology    Discharge Exam: Filed Vitals:   07/06/12 0708 07/06/12 1032 07/06/12 1043 07/06/12 1239  BP:    102/57  Pulse:   80   Temp:      TempSrc:      Resp:      Height:      Weight:      SpO2: 94% 96%      General: obese awake NAD Cardiovascular: irregularly irregular 1+LEE Respiratory: increased work of breathing with ambulation. Diminished BS faint wheeze bilaterally  Discharge Instructions      Discharge Orders   Future Appointments Provider Department Dept Phone   07/21/2012 3:45 PM Gaylord Shih, MD [facility] [place] [phone]   Future Orders Complete  By Expires     Diet - low sodium heart healthy  As directed     Increase activity slowly  As directed         Medication List    STOP taking these medications       furosemide 20 MG tablet  Commonly known as:  LASIX     metoprolol succinate 50 MG 24 hr tablet  Commonly known as:  TOPROL-XL      TAKE these medications       albuterol 108 (90 BASE) MCG/ACT inhaler  Commonly known as:  PROVENTIL HFA;VENTOLIN HFA  Inhale 2 puffs into the lungs 2 (two)  times daily. For shortness of breath     ALPRAZolam 0.5 MG tablet  Commonly known as:  XANAX  Take 1 tablet (0.5 mg total) by mouth 3 (three) times daily as needed. For anxiety     aspirin EC 81 MG tablet  Take 81 mg by mouth daily.     benzonatate 100 MG capsule  Commonly known as:  TESSALON  Take 1 capsule (100 mg total) by mouth 3 (three) times daily.     calcitRIOL 0.25 MCG capsule  Commonly known as:  ROCALTROL  Take 1 capsule (0.25 mcg total) by mouth daily.     carbamide peroxide 6.5 % otic solution  Commonly known as:  DEBROX  Place 5 drops into both ears 2 (two) times daily.     cyanocobalamin 1000 MCG tablet  Take 1 tablet (1,000 mcg total) by mouth daily.     digoxin 0.125 MG tablet  Commonly known as:  LANOXIN  Take 1 tablet (0.125 mg total) by mouth daily.     esomeprazole 40 MG capsule  Commonly known as:  NEXIUM  Take 40 mg by mouth 2 (two) times daily.     fluticasone 50 MCG/ACT nasal spray  Commonly known as:  FLONASE  Place 1 spray into the nose daily.     Fluticasone-Salmeterol 250-50 MCG/DOSE Aepb  Commonly known as:  ADVAIR  Inhale 1 puff into the lungs every 12 (twelve) hours.     gabapentin 100 MG capsule  Commonly known as:  NEURONTIN  Take 100 mg by mouth 3 (three) times daily.     HYDROcodone-acetaminophen 5-325 MG per tablet  Commonly known as:  NORCO/VICODIN  Take 1 tablet by mouth every 4 (four) hours as needed.     insulin aspart 100 UNIT/ML injection  Commonly known as:  novoLOG  Inject 10 Units into the skin 3 (three) times daily with meals.     insulin detemir 100 UNIT/ML injection  Commonly known as:  LEVEMIR FLEXPEN  Inject 60 Units into the skin at bedtime.     ipratropium 0.02 % nebulizer solution  Commonly known as:  ATROVENT  Take 2.5 mLs (0.5 mg total) by nebulization every 6 (six) hours.     iron polysaccharides 150 MG capsule  Commonly known as:  NIFEREX  Take 1 capsule (150 mg total) by mouth 2 (two) times daily.      levalbuterol 0.63 MG/3ML nebulizer solution  Commonly known as:  XOPENEX  Take 3 mLs (0.63 mg total) by nebulization every 6 (six) hours.     levothyroxine 200 MCG tablet  Commonly known as:  SYNTHROID, LEVOTHROID  Take 200 mcg by mouth daily.     linagliptin 5 MG Tabs tablet  Commonly known as:  TRADJENTA  Take 5 mg by mouth every morning.     nitroGLYCERIN 0.4 MG SL  tablet  Commonly known as:  NITROSTAT  Place 1 tablet (0.4 mg total) under the tongue every 5 (five) minutes as needed for chest pain.     polyethylene glycol packet  Commonly known as:  MIRALAX / GLYCOLAX  Take 17 g by mouth daily.     predniSONE 10 MG tablet  Commonly known as:  DELTASONE  Take 3 tabs 07/07/12 take 2/tabs 2/12 and 2/13 take 1 tab 2/14 and 2/15 then take half a tablet daily.     predniSONE 5 MG tablet  Commonly known as:  DELTASONE  Take 1 tablet (5 mg total) by mouth daily.  Start taking on:  07/12/2012     predniSONE 5 MG tablet  Commonly known as:  DELTASONE  Take 1 tablet (5 mg total) by mouth daily.  Start taking on:  07/12/2012     rosuvastatin 40 MG tablet  Commonly known as:  CRESTOR  Take 1 tablet (40 mg total) by mouth at bedtime.     sertraline 50 MG tablet  Commonly known as:  ZOLOFT  Take 50 mg by mouth daily.     sodium chloride 0.65 % Soln nasal spray  Commonly known as:  OCEAN  Place 1 spray into the nose as needed for congestion.     tolterodine 4 MG 24 hr capsule  Commonly known as:  DETROL LA  Take 4 mg by mouth every morning.     torsemide 20 MG tablet  Commonly known as:  DEMADEX  Take 1 tablet (20 mg total) by mouth daily.     verapamil 80 MG tablet  Commonly known as:  CALAN  Take 1 tablet (80 mg total) by mouth every 6 (six) hours.     XARELTO 20 MG Tabs  Generic drug:  Rivaroxaban  Take 20 mg by mouth daily.       Follow-up Information   Follow up with Valera Castle, MD On 07/21/2012. (3:45 pm)    Contact information:   [address] [place], [place] [address] [phone]        The results of significant diagnostics from this hospitalization (including imaging, microbiology, ancillary and laboratory) are listed below for reference.    Significant Diagnostic Studies: Dg Chest 2 View  06/27/2012  *RADIOLOGY REPORT*  Clinical Data: Chest pain.  Chest congestion.  CHEST - 2 VIEW  Comparison: 06/13/2012  Findings: The left arm PICC line has been removed since previous study.  The low lung volumes are again demonstrated.  Left lower lung atelectasis or scarring is unchanged.  Cardiomegaly stable. Right lung remains clear.  IMPRESSION: Stable cardiomegaly, low lung volumes, and left lower lung scarring.  No acute findings.   Original Report Authenticated By: Myles Rosenthal, M.D.    Dg Lumbar Spine Complete  06/12/2012  *RADIOLOGY REPORT*  Clinical Data: Fall.  The patient had barium enema performed earlier today.  LUMBAR SPINE - COMPLETE 4+ VIEW  Comparison: Lumbar spine radiographs 08/22/2010  Findings: There is barium in the colon from a barium enema performed earlier today.  Multiple diverticula are filled with barium in the sigmoid colon.  The colon is nondistended.  Lumbar spine vertebral bodies are normal in height and alignment. Disc space narrowing at L4-5 and L5-S1 is stable.  The remainder of the disc heights are preserved.  There is some facet joint degenerative change on the left at L4-L5 and L5-S1.  Negative for fracture.  Portions of some vertebral bodies are obscured by the barium on the frontal  and oblique views.  IMPRESSION:  1.  No acute bony abnormality. 2.  Stable lower lumbar spine degenerative disc disease and facet joint arthropathy.   Original Report Authenticated By: Britta Mccreedy, M.D.    Dg Hip Complete Left  06/13/2012  *RADIOLOGY REPORT*  Clinical Data: Left hip pain and leg pain secondary to a fall.  LEFT HIP - COMPLETE 2+ VIEW  Comparison: None.  Findings: No fracture, dislocation, or significant  degenerative change of the left hip.  IMPRESSION: Normal exam.   Original Report Authenticated By: Francene Boyers, M.D.    Ct Head Wo Contrast  06/27/2012  *RADIOLOGY REPORT*  Clinical Data: Altered mental status.  Blood thinners.  CT HEAD WITHOUT CONTRAST  Technique:  Contiguous axial images were obtained from the base of the skull through the vertex without contrast.  Comparison: CT head without contrast 04/17/2012.  Findings: Atrophy and mild white matter disease is stable.  No acute cortical infarct, hemorrhage, mass lesion is present.  The ventricles are of normal size.  No significant extra-axial fluid collection is present.  Anterior ethmoid and frontal sinus disease is present.  Bilateral mastoid effusions are similar to the prior exam.  No obstructing nasopharyngeal lesion is evident.  IMPRESSION:  1.  Stable mild atrophy white matter disease. 2.  No acute intracranial abnormality. 3.  Mild anterior ethmoid and frontal sinus disease. 4.  Similar bilateral mastoid effusions.  No obstructing nasopharyngeal lesion is evident.   Original Report Authenticated By: Marin Roberts, M.D.    Ct Cervical Spine Wo Contrast  06/15/2012  *RADIOLOGY REPORT*  Clinical Data: Acute onset of bilateral lower extremity weakness. The patient fell 2 days ago.  CT CERVICAL SPINE WITHOUT CONTRAST  Technique:  Multidetector CT imaging of the cervical spine was performed. Multiplanar CT image reconstructions were also generated.  Comparison: CT scan of the cervical spine dated 06/12/2012  Findings: There is solid anterior fusion at C5-6 and C6-7.  There is no acute fracture, subluxation, prevertebral soft tissue swelling, or other acute abnormality.  There is slight degenerative disc disease at C3-4 and C4-5 with the left foraminal stenosis at C4-5 due to uncinate spurs.  Osteophytes touch the ventral aspect of the spinal cord at C5-6 and C6-7.  IMPRESSION: No acute abnormality of the cervical spine.   Original Report  Authenticated By: Francene Boyers, M.D.    Ct Cervical Spine Wo Contrast  06/12/2012  *RADIOLOGY REPORT*  Clinical Data: Fall today  CT CERVICAL SPINE WITHOUT CONTRAST  Technique:  Multidetector CT imaging of the cervical spine was performed. Multiplanar CT image reconstructions were also generated.  Comparison: None.  Findings: Image detail is limited by patient body habitus. Cervical spine is aligned from the skull base through the superior endplate of T2.  There are postoperative changes of anterior cervical discectomy and fusion spanning C5-C7.  No evidence of hardware loosening.  Mild to moderate disc height loss at C3-4 and C4-5.  No acute fracture is identified. Mild left neural foraminal narrowing at C4-C5.  Mild bilateral neural foraminal narrowing at C5-C6 and C6-C7.  Mild left neural foraminal narrowing at C7-T1.  The prevertebral soft tissue contours appear within normal limits.  IMPRESSION: 1.  No evidence of acute bony injury to the cervical spine. 2.  Anterior cervical discectomy and fusion spanning C5 this and seven. 3.  Degenerative changes as described above.   Original Report Authenticated By: Britta Mccreedy, M.D.    Ct Thoracic Spine Wo Contrast  06/15/2012  *RADIOLOGY REPORT*  Clinical  Data: Acute onset of bilateral lower extremity weakness after fall 2 days ago.  CT THORACIC SPINE WITHOUT CONTRAST  Technique:  Multidetector CT imaging of the thoracic spine was performed without intravenous contrast administration. Multiplanar CT image reconstructions were also generated  Comparison: None.  Findings: There is no visible fracture or subluxation.  There is no paravertebral soft tissue stranding.  No visible disc protrusions. Flowing anterior osteophytes appear to fuse the thoracic spine from T4 to at least T11.  IMPRESSION: No acute abnormality.  Probable anterior fusion from T4- T11 by osteophytes.   Original Report Authenticated By: Francene Boyers, M.D.    Mr Brain Wo Contrast  06/30/2012   *RADIOLOGY REPORT*  Clinical Data: Altered mental status  MRI HEAD WITHOUT CONTRAST  Technique:  Multiplanar, multiecho pulse sequences of the brain and surrounding structures were obtained according to standard protocol without intravenous contrast.  Comparison: CT head 06/27/2012  Findings: Image quality limited due to large patient size with difficulty positioning the patient in the head coil.  There is mild motion.  Negative for acute infarct.  Mild chronic microvascular ischemic changes in the white matter.  No cortical infarct.  Brainstem is normal.  Negative for mass or edema.  No shift of the midline structures. Negative for intracranial hemorrhage.  Paranasal sinuses are clear.  Bilateral mastoid sinus effusion.  IMPRESSION: No acute intracranial abnormality.  Mild chronic microvascular ischemia in the white matter.  Bilateral mastoid sinus effusion.   Original Report Authenticated By: Janeece Riggers, M.D.    Mr Lumbar Spine Wo Contrast  06/14/2012  *RADIOLOGY REPORT*  Clinical Data: Bilateral lower extremity weakness.  MRI LUMBAR SPINE WITHOUT CONTRAST  Technique:  Multiplanar and multiecho pulse sequences of the lumbar spine were obtained without intravenous contrast.  Comparison: None.  Findings: Normal alignment of the lumbar vertebral bodies.  They demonstrate normal marrow signal.  The conus medullaris terminates at L1.  Normal MR appearance of the distal thoracic cord.  The spinal canal is generous.  No significant spinal stenosis.  No significant disc protrusions.  Mild bulging annulus at L4-5 with minimal foraminal encroachment bilaterally.  Mild foraminal encroachment on the left at L5-S1 also.  IMPRESSION: Unremarkable lumbar spine MRI examination.  No findings to account for the patient's symptoms.   Original Report Authenticated By: Rudie Meyer, M.D.    US Renal  06/14/2012  *RADIOLOGY REPORT*  Clinical Data: Acute renal failure.  RENAL/URINARY TRACT ULTRASOUND COMPLETE  Comparison:  CT  scan 06/16/2006  Findings:  Right Kidney:  11.6 cm in length.  Mild renal cortical thinning but normal echogenicity.  No hydronephrosis, renal mass or renal calculi.  Left Kidney:  11.8 cm in length.  Mild renal cortical thinning but normal echogenicity.  No focal lesions, hydronephrosis or renal calculi.  Bladder:  Normal.  IMPRESSION: Mild renal cortical thinning but no hydronephrosis or renal mass.   Original Report Authenticated By: Rudie Meyer, M.D.    Dg Chest Port 1 View  07/03/2012  *RADIOLOGY REPORT*  Clinical Data: CHF, morbid obesity  PORTABLE CHEST - 1 VIEW  Comparison: Portable exam 1451 hours compared to 06/27/2012  Findings: Rotation to the left. Enlargement of cardiac silhouette. Mediastinal contours and pulmonary vascularity normal. Question tortuous thoracic aorta. Bibasilar atelectasis. Upper lungs clear. No gross pleural effusion or pneumothorax. Prior cervical spine fusion.  IMPRESSION: Enlargement of cardiac silhouette. Bibasilar atelectasis.   Original Report Authenticated By: Ulyses Southward, M.D.    Dg Chest Port 1 View  06/13/2012  *RADIOLOGY REPORT*  Clinical Data: Post PICC line insertion  PORTABLE CHEST - 1 VIEW  Comparison: 09/11/2011  Findings: Grossly unchanged enlarged cardiac silhouette mediastinal contours. Left upper extremity approach PICC line terminates over the distal SVC.  Grossly unchanged bibasilar heterogeneous opacities, left greater than right.  No new focal airspace opacities.  No definite pleural effusion or pneumothorax. Unchanged bones of the lower cervical ACDF, incompletely evaluated.  IMPRESSION: 1.  Left upper extremity approach PICC line tip terminates over the distal SVC. 2.  Unchanged cardiomegaly.  3.  Persistently reduced lung volumes with grossly unchanged bibasilar opacities, left greater than right, atelectasis versus infiltrate.   Original Report Authenticated By: Tacey Ruiz, MD    Dg Knee Complete 4 Views Left  06/13/2012  *RADIOLOGY REPORT*   Clinical Data: Left knee pain secondary to a fall.  LEFT KNEE - COMPLETE 4+ VIEW  Comparison: 07/25/2006  Findings: There is no fracture or dislocation.  No joint effusion. Progression of fairly severe osteoarthritis of the lateral compartment and to a lesser degree in the medial compartment.  IMPRESSION: No acute abnormality.  Progressive osteoarthritis.   Original Report Authenticated By: Francene Boyers, M.D.    Dg Abd 2 Views  06/27/2012  *RADIOLOGY REPORT*  Clinical Data: Abdominal pain and distention.  Constipation. Morbid obesity.  ABDOMEN - 2 VIEW  Comparison: None.  Findings: Exam is technically suboptimal due to morbid obesity.  No evidence of dilated bowel loops.  Residual contrast is seen within diverticula in the sigmoid colon.  No evidence of free air.  Right upper quadrant surgical clips are seen from prior cholecystectomy.  IMPRESSION:  1.  No acute findings. 2.  Sigmoid diverticulosis.   Original Report Authenticated By: Myles Rosenthal, M.D.    Dg Colon W/cm - Wo/w Kub  06/12/2012  *RADIOLOGY REPORT*  Clinical Data: Heme positive stools  BARIUM ENEMA:  Technique: After insertion of an enema tip, barium was instilled retrograde into the colon under gravity drip.  Multiple images of the colon were obtained. Exam is aeration is limited by body habitus and limited patient mobility.  Fluoroscopy time: 4.4 minutes  Comparison: None  Findings: Normal bowel gas pattern on scout image. Normal retrograde passage of contrast from the rectum to the cecal tip. Appendix surgically absent. Competent ileocecal valve. Diverticulosis of sigmoid colon. Patient was unable to tolerate any significant degree colonic distention and incontinent of contrast and expelled the enema tip/balloon once during the exam. No gross narrowing or stricture identified. Tiny filling defect identified at the left lateral aspect of the rectum on a single image question tiny polyp versus artifact. No definite or additional persistent  intraluminal filling defects. Smooth appearance of colonic mucosa on single contrast exam.  IMPRESSION: Sigmoid diverticulosis. Questionable tiny left lateral rectal polyp.   Original Report Authenticated By: Ulyses Southward, M.D.     Microbiology: Recent Results (from the past 240 hour(s))  URINE CULTURE     Status: None   Collection Time    06/27/12  7:43 PM      Result Value Range Status   Specimen Description URINE, CATHETERIZED   Final   Special Requests NONE   Final   Culture  Setup Time 06/28/2012 20:42   Final   Colony Count NO GROWTH   Final   Culture NO GROWTH   Final   Report Status 06/29/2012 FINAL   Final  URINE CULTURE     Status: None   Collection Time    06/28/12  9:17 AM  Result Value Range Status   Specimen Description URINE, CLEAN CATCH   Final   Special Requests NONE   Final   Culture  Setup Time 06/28/2012 20:42   Final   Colony Count NO GROWTH   Final   Culture NO GROWTH   Final   Report Status 06/29/2012 FINAL   Final  MRSA PCR SCREENING     Status: Abnormal   Collection Time    07/02/12  2:15 PM      Result Value Range Status   MRSA by PCR POSITIVE (*) NEGATIVE Final   Comment:            The GeneXpert MRSA Assay (FDA     approved for NASAL specimens     only), is one component of a     comprehensive MRSA colonization     surveillance program. It is not     intended to diagnose MRSA     infection nor to guide or     monitor treatment for     MRSA infections.     RESULT CALLED TO, READ BACK BY AND VERIFIED WITH:     PHILLIPS,C. AT 1740 ON 07/02/2012 BY BAUGHAM,M.     Labs: Basic Metabolic Panel:  Recent Labs Lab 07/02/12 0536 07/03/12 0509 07/04/12 0445 07/05/12 0449 07/05/12 2238 07/06/12 0445  NA 140 136 137 137  --  137  K 3.7 3.7 3.8 4.3  --  5.1  CL 103 98 96 96  --  98  CO2 29 29 33* 34*  --  32  GLUCOSE 240* 319* 272* 259* 392* 288*  BUN 12 13 16 20   --  23  CREATININE 1.49* 1.30* 1.28* 1.23*  --  1.04  CALCIUM 8.3* 8.4 8.8  9.1  --  9.4  PHOS  --  2.4  --  3.0  --   --    Liver Function Tests: No results found for this basename: AST, ALT, ALKPHOS, BILITOT, PROT, ALBUMIN,  in the last 168 hours No results found for this basename: LIPASE, AMYLASE,  in the last 168 hours No results found for this basename: AMMONIA,  in the last 168 hours CBC:  Recent Labs Lab 07/02/12 0536 07/03/12 0509 07/04/12 0445 07/05/12 0449 07/06/12 0445  WBC 7.2 10.2 10.4 9.9 9.7  HGB 7.6* 9.1* 9.3* 8.9* 9.0*  HCT 26.5* 30.6* 31.1* 29.7* 30.1*  MCV 86.3 84.8 86.1 87.4 88.5  PLT 215 231 235 216 223   Cardiac Enzymes: No results found for this basename: CKTOTAL, CKMB, CKMBINDEX, TROPONINI,  in the last 168 hours BNP: BNP (last 3 results)  Recent Labs  07/03/12 0509  PROBNP 1534.0*   CBG:  Recent Labs Lab 07/05/12 1630 07/05/12 2050 07/05/12 2234 07/06/12 0811 07/06/12 1237  GLUCAP 378* 406* 402* 231* 254*       Signed:  Maedell Hedger  [facility] 07/06/2012, 1:06 PM  Attending note:  Patient seen and examined on the day of discharge.  Agree with note as above.

## 2012-07-06 NOTE — Progress Notes (Signed)
Inpatient Diabetes Program Recommendations  AACE/ADA: New Consensus Statement on Inpatient Glycemic Control (2013)  Target Ranges:  Prepandial:   less than 140 mg/dL      Peak postprandial:   less than 180 mg/dL (1-2 hours)      Critically ill patients:  140 - 180 mg/dL   Results for SUKANYA, GOLDBLATT (MRN [id number]) as of 07/06/2012 10:37  Ref. Range 07/05/2012 07:39 07/05/2012 11:20 07/05/2012 16:30 07/05/2012 20:50 07/05/2012 22:34 07/06/2012 08:11  Glucose-Capillary Latest Range: 70-99 mg/dL 409 (H) 811 (H) 914 (H) 406 (H) 402 (H) 231 (H)    Inpatient Diabetes Program Recommendations Insulin - Basal: Please consider increasing Lantus to 60 units QHS. Insulin - Meal Coverage: Please consider increasing meal coverage to 15 units TID.  Note: Fasting blood glucose this morning was 231 mg/dl and blood glucose has ranged from 211-406 mg/dl over the past 24 hours.  Please consider increasing Lantus to 60 units QHS and increasing Novolog meal coverage to 15 units TID.  Will continue to follow.  Thanks, Orlando Penner, RN, BSN, CCRN Diabetes Coordinator Inpatient Diabetes Program [phone]

## 2012-07-06 NOTE — Evaluation (Signed)
Physical Therapy Evaluation Patient Details Name: Ann [name] MRN: [id number] DOB: November 03, 1945 Today's Date: 07/06/2012 Time: 4401-0272 PT Time Calculation (min): 48 min  PT Assessment / Plan / Recommendation Clinical Impression  Pt was seen for reassessment following  a stay in ICU for cardiac issues.  She is currently at the same functional level as that seen last week prior to ICU stay.  She is certainly very deconditioned, but unfortunately this is her baseline.  She is able to ambulate 3' with a walker, stable gait pattern.    O2 sat on room air is 96% at rest and with gait.  I would recommend HHPT at d/c/    PT Assessment  Patient needs continued PT services    Follow Up Recommendations  Home health PT    Does the patient have the potential to tolerate intense rehabilitation      Barriers to Discharge None      Equipment Recommendations  None recommended by PT    Recommendations for Other Services     Frequency Min 3X/week    Precautions / Restrictions Precautions Precautions: Fall Restrictions Weight Bearing Restrictions: No   Pertinent Vitals/Pain       Mobility  Bed Mobility Bed Mobility: Sit to Supine Supine to Sit: 6: Modified independent (Device/Increase time);HOB elevated;With rails Sitting - Scoot to Edge of Bed: 6: Modified independent (Device/Increase time) Sit to Supine: Not Tested (comment) Scooting to [facility]: 6: Modified independent (Device/Increase time) Transfers Transfers: Sit to Stand;Stand to Sit Sit to Stand: 5: Supervision;With upper extremity assist Stand to Sit: 5: Supervision;To chair/3-in-1;With upper extremity assist Ambulation/Gait Ambulation/Gait Assistance: 5: Supervision Ambulation Distance (Feet): 15 Feet Assistive device: Rolling walker Gait Pattern: Trunk flexed;Shuffle General Gait Details: verbal cues to improve cervical posture Stairs: No Wheelchair Mobility Wheelchair Mobility: No    Exercises General Exercises -  Lower Extremity Ankle Circles/Pumps: AROM;Both;10 reps;Supine Quad Sets: AROM;Both;10 reps;Supine Gluteal Sets: AROM;Both;10 reps;Supine Short Arc Quad: AROM;Both;10 reps;Supine Heel Slides: AROM;Both;10 reps;Supine Hip ABduction/ADduction: AROM;Both;10 reps;Supine   PT Diagnosis: Difficulty walking;Generalized weakness  PT Problem List: Decreased strength;Decreased activity tolerance;Decreased mobility;Obesity PT Treatment Interventions: Gait training;Functional mobility training;Therapeutic activities;Therapeutic exercise   PT Goals Acute Rehab PT Goals PT Goal Formulation: With patient Potential to Achieve Goals: Good PT Goal: Supine/Side to Sit - Progress: Discontinued (comment) PT Goal: Sit to Stand - Progress: Discontinued (comment) PT Goal: Stand to Sit - Progress: Discontinued (comment) Pt will Ambulate: 16 - 50 feet;with supervision PT Goal: Ambulate - Progress: Goal set today  Visit Information  Last PT Received On: 07/06/12    Subjective Data  Subjective: I got up to the chair once in the ICU Patient Stated Goal: return home   Prior Functioning       Cognition  Cognition Overall Cognitive Status: Appears within functional limits for tasks assessed/performed Arousal/Alertness: Awake/alert Orientation Level: Appears intact for tasks assessed Behavior During Session: [facility] for tasks performed    Extremity/Trunk Assessment Right Lower Extremity Assessment RLE ROM/Strength/Tone: [facility] for tasks assessed RLE ROM/Strength/Tone Deficits: strength generally 3/5 RLE Sensation: WFL - Light Touch Left Lower Extremity Assessment LLE ROM/Strength/Tone: WFL for tasks assessed LLE ROM/Strength/Tone Deficits: strength generally 3/5 LLE Sensation: WFL - Light Touch   Balance Balance Balance Assessed: No  End of Session PT - End of Session Equipment Utilized During Treatment: Gait belt Activity Tolerance: Patient tolerated treatment well Patient left: in chair;with  family/visitor present;with call bell/phone within reach Nurse Communication: Mobility status  GP     Myrlene Broker  L 07/06/2012, 10:44 AM

## 2012-07-07 DIAGNOSIS — A499 Bacterial infection, unspecified: Secondary | ICD-10-CM | POA: Diagnosis not present

## 2012-07-07 DIAGNOSIS — N390 Urinary tract infection, site not specified: Secondary | ICD-10-CM | POA: Diagnosis not present

## 2012-07-07 DIAGNOSIS — L02419 Cutaneous abscess of limb, unspecified: Secondary | ICD-10-CM | POA: Diagnosis not present

## 2012-07-07 DIAGNOSIS — L89309 Pressure ulcer of unspecified buttock, unspecified stage: Secondary | ICD-10-CM | POA: Diagnosis not present

## 2012-07-07 DIAGNOSIS — I2510 Atherosclerotic heart disease of native coronary artery without angina pectoris: Secondary | ICD-10-CM | POA: Diagnosis not present

## 2012-07-07 DIAGNOSIS — E119 Type 2 diabetes mellitus without complications: Secondary | ICD-10-CM | POA: Diagnosis not present

## 2012-07-07 DIAGNOSIS — L03119 Cellulitis of unspecified part of limb: Secondary | ICD-10-CM | POA: Diagnosis not present

## 2012-07-07 NOTE — Progress Notes (Signed)
AVS reviewed with patient's husband and patient's son.  Prescriptions provided to patient by Dr.Memon.  Patients husband verbalized understanding of d/c instructions.  Teach back method used.  Pt's IV removed. Site WNL. Pt transported via w/c to main entrance for discharge. O2 sats on RA 96 percent.  Pt stable at time of d/c.

## 2012-07-07 NOTE — Discharge Summary (Signed)
Physician Discharge Summary  Ann [name] ZOX:[id number] DOB: 1946-05-26 DOA: 06/27/2012  PCP: [name], MD  Admit date: 06/27/2012 Discharge date: 07/07/2012  Time spent: 40 minutes  Recommendations for Outpatient Follow-up:  1. Follow up with Dr. Daleen Squibb. Has apt 07/21/12 2. HH RN to monitor CBG as have been trending high in hospital somewhat attributable to steroids. Insulin adjusted accordingly. 3. Patient will need outpatient sleep study. This can be arranged by primary care doctor   Discharge Diagnoses:  Principal Problem:   Acute encephalopathy Active Problems:   HYPOTHYROIDISM   HYPERTENSION   CAD   Diabetes mellitus type 2 with complications   Adrenal insufficiency   DVT (deep venous thrombosis)   ARF (acute renal failure)   UTI (lower urinary tract infection)   Chronic anticoagulation   Hypokalemia   Shortness of breath   Vitamin B12 deficiency   Sinusitis chronic, frontal   Bilateral lower extremity edema   Left leg cellulitis   Morbid obesity   Chronic kidney disease, stage III (moderate)   Lethargy   Atrial fibrillation with RVR   Discharge Condition: Stable and ready for discharge to home with [facility].  Diet recommendation: carb modified and heart healthy  Filed Weights   07/04/12 0500 07/05/12 0500 07/06/12 0457  Weight: 138.3 kg (304 lb 14.3 oz) 141.1 kg (311 lb 1.1 oz) 141.1 kg (311 lb 1.1 oz)    History of present illness:  Ann [name] is a 67 y.o. female with a past medical history of obesity, hypertension, DVT, on anticoagulation, hypothyroidism, COPD, who was recently hospitalized in January for acute renal failure presented to [facility] on 06/27/12 with cc confusion and decreased urination. On previous hospitalization she also complained of back pain, which prompted transfer to [facility] for MRI. MRI did not show any acute abnormalities. Patient subsequently, was discharged home. Family reported that 2 days prior to [facility]  confused. She was seeing things that were not there. She was talking about things from the past as if they were happening now. Had some chills at home and felt hot earlier today. Had been having difficulties with urination. Complained of burning with urination. Had been constipated for the prior 3 days. She also had some shortness of breath with a cough. She did  have chronic cough. She has chronic lower extremity edema. She was initiated on diuretics recently. She also has a history of adrenal insufficiency and is on steroids chronically. She also mentioned she had abdominal pain in the lower abdomen. Was unable to characterize any further. So, poor history because of patient's confusion.      Hospital Course:  Atrial fibrillation with RVR. Patient went into new onset rapid atrial fibrillation on 07/02/12, requiring transfer to the step down unit. She was asymptomatic through this. She was started on a cardizem infusion for heart rate control and since then has been weaned off. She was transitioned to oral verapamil and digoxin and heart rate controlled in 70s-80s. Echo shows hyperdynamic EF. Cardiology consulted. She is anticoagulated on xarelto. Will follow up with Dr Daleen Squibb 07/21/12. Acute encephalopathy. Likely related to UTI. Hydrated and given antibiotics. Seen by neurology. Resolved at baseline at discharge.  No further work up needed per neurology.  Urinary tract infection. Completed a course of rocephin. Urine culture reveals no growth. Reports less burning on urination.  Chronic ethmoid and frontal sinusitis. Possible mastoiditis. Much improved at discharge. Continue steroid nasal spray and as needed nasal saline.  Anemia with  associated vitamin B12 deficiency. Anemia panel from 06/13/2012 revealed vitamin B12 low at 206, normal folate at 6.5, total iron mildly low at 20, normal TIBC of 351, low-normal ferritin of 42, and normal total reticulocyte count of 95. Patient recieved IV iron per nephrology  and received 1 unit PRBC for symptomatic anemia. Her hemoglobin has responded appropriately. Hemoccult stools are negative. Recommend Op follow up. At discharge Hg stable at baseline 9.0. Sigmoid diverticulosis and questionable rectal polyp. Per barium enema study on 06/08/2012 ordered by GI Dr. Russella Dar. Apparently, the patient was too high risk for colonoscopy per family.  Acute renal failure, superimposed on chronic kidney disease, likely stage III. It is likely she has stage III chronic kidney disease secondary to a combination of hypertension and diabetes mellitus. Seen by nephrology. Creatinine stable. Current level in baseline range. Her diuretics have been changed to oral demadex. At discharge creatinine 1.04. Hypokalemia. Resolved following potassium chloride supplementation. Magnesium level is within normal limits. Hypokalemia may be secondary to chronic diuretic therapy.  History of left lower extremity DVT. She is anticoagulated with rivaroxaban. She was diagnosed with left lower extremity DVT per ultrasound in November 2013.   Bilateral lower extremity edema with erythema. The edema is likely multifactorial i.e. Lymphedema in setting of morbid obesity, sedentary lifestyle and addisons disease.  It is likely that her LE erythema is due to venous stasis than an infective process. Antibiotics were discontinued. Diuretic therapy was started by Dr. Kristian Covey. 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 Her CBGs were trending upward, likely secondary to increased steroids. Lantus and sliding scale NovoLog adjusted. Hemoglobin A1c is 7.5. Will be discharged on higher dose lantus, continue meal coverage. Will also have HH RN to assist with monitoring CBG's COPD. query obstructive sleep apnea. Patient had wheezing throughout hospitalization. Suspect that she has a chronic wheeze. She received nebs and steroids and gradually improved to baseline. At discharge sat 96% at rest  and with ambulation on room air. Will discharge with steroid taper. Continue nebs at home.  CAD/hypertension. No complaints CP. Troponin I is negative x2. BP controlled. Continue current regimen.  Hypothyroidism. We'll continue Synthroid. TSH is mildly elevated, but her free T4 is within normal limits. Will maintain Synthroid at the same dose. Recommend OP follow up.  Chronic adrenal insufficiency. On chronic prednisone. The patient was given to stress doses of IV hydrocortisone 06/29/12. Restarted prednisone 06/30/12, but at a slightly higher dose.  Will taper back to  5 mg daily.  Morbid obesity and deconditioning. Home health therapy recommended by the therapists.  Cerumen impaction. This could be the cause of her difficulty with hearing. Given drops. Recommend OP follow up.      Procedures:none Consultations:  Cardiology  Neurology  nephrology    Discharge Exam: Filed Vitals:   07/06/12 1032 07/06/12 1043 07/06/12 1239 07/06/12 1428  BP:   102/57   Pulse:  80    Temp:      TempSrc:      Resp:      Height:      Weight:      SpO2: 96%   94%    General: obese awake NAD Cardiovascular: irregularly irregular 1+LEE Respiratory: increased work of breathing with ambulation. Diminished BS faint wheeze bilaterally  Discharge Instructions      Discharge Orders   Future Appointments Provider Department Dept Phone   07/21/2012 3:45 PM Gaylord Shih, MD [facility] [place] [phone]   Future Orders Complete  By Expires     Diet - low sodium heart healthy  As directed     Increase activity slowly  As directed         Medication List    STOP taking these medications       furosemide 20 MG tablet  Commonly known as:  LASIX     metoprolol succinate 50 MG 24 hr tablet  Commonly known as:  TOPROL-XL      TAKE these medications       albuterol 108 (90 BASE) MCG/ACT inhaler  Commonly known as:  PROVENTIL HFA;VENTOLIN HFA  Inhale 2 puffs into the lungs 2 (two)  times daily. For shortness of breath     ALPRAZolam 0.5 MG tablet  Commonly known as:  XANAX  Take 1 tablet (0.5 mg total) by mouth 3 (three) times daily as needed. For anxiety     aspirin EC 81 MG tablet  Take 81 mg by mouth daily.     benzonatate 100 MG capsule  Commonly known as:  TESSALON  Take 1 capsule (100 mg total) by mouth 3 (three) times daily.     calcitRIOL 0.25 MCG capsule  Commonly known as:  ROCALTROL  Take 1 capsule (0.25 mcg total) by mouth daily.     carbamide peroxide 6.5 % otic solution  Commonly known as:  DEBROX  Place 5 drops into both ears 2 (two) times daily.     cyanocobalamin 1000 MCG tablet  Take 1 tablet (1,000 mcg total) by mouth daily.     digoxin 0.125 MG tablet  Commonly known as:  LANOXIN  Take 1 tablet (0.125 mg total) by mouth daily.     esomeprazole 40 MG capsule  Commonly known as:  NEXIUM  Take 40 mg by mouth 2 (two) times daily.     fluticasone 50 MCG/ACT nasal spray  Commonly known as:  FLONASE  Place 1 spray into the nose daily.     Fluticasone-Salmeterol 250-50 MCG/DOSE Aepb  Commonly known as:  ADVAIR  Inhale 1 puff into the lungs every 12 (twelve) hours.     gabapentin 100 MG capsule  Commonly known as:  NEURONTIN  Take 100 mg by mouth 3 (three) times daily.     HYDROcodone-acetaminophen 5-325 MG per tablet  Commonly known as:  NORCO/VICODIN  Take 1 tablet by mouth every 4 (four) hours as needed.     insulin aspart 100 UNIT/ML injection  Commonly known as:  novoLOG  Inject 10 Units into the skin 3 (three) times daily with meals.     insulin detemir 100 UNIT/ML injection  Commonly known as:  LEVEMIR FLEXPEN  Inject 60 Units into the skin at bedtime.     ipratropium 0.02 % nebulizer solution  Commonly known as:  ATROVENT  Take 2.5 mLs (0.5 mg total) by nebulization every 6 (six) hours.     iron polysaccharides 150 MG capsule  Commonly known as:  NIFEREX  Take 1 capsule (150 mg total) by mouth 2 (two) times daily.      levalbuterol 0.63 MG/3ML nebulizer solution  Commonly known as:  XOPENEX  Take 3 mLs (0.63 mg total) by nebulization every 6 (six) hours.     levothyroxine 200 MCG tablet  Commonly known as:  SYNTHROID, LEVOTHROID  Take 200 mcg by mouth daily.     linagliptin 5 MG Tabs tablet  Commonly known as:  TRADJENTA  Take 5 mg by mouth every morning.     nitroGLYCERIN 0.4 MG SL  tablet  Commonly known as:  NITROSTAT  Place 1 tablet (0.4 mg total) under the tongue every 5 (five) minutes as needed for chest pain.     polyethylene glycol packet  Commonly known as:  MIRALAX / GLYCOLAX  Take 17 g by mouth daily.     predniSONE 10 MG tablet  Commonly known as:  DELTASONE  Take 3 tabs 07/07/12 take 2/tabs 2/12 and 2/13 take 1 tab 2/14 and 2/15 then take half a tablet daily.     predniSONE 5 MG tablet  Commonly known as:  DELTASONE  Take 1 tablet (5 mg total) by mouth daily.  Start taking on:  07/12/2012     predniSONE 5 MG tablet  Commonly known as:  DELTASONE  Take 1 tablet (5 mg total) by mouth daily.  Start taking on:  07/12/2012     rosuvastatin 40 MG tablet  Commonly known as:  CRESTOR  Take 1 tablet (40 mg total) by mouth at bedtime.     sertraline 50 MG tablet  Commonly known as:  ZOLOFT  Take 50 mg by mouth daily.     sodium chloride 0.65 % Soln nasal spray  Commonly known as:  OCEAN  Place 1 spray into the nose as needed for congestion.     tolterodine 4 MG 24 hr capsule  Commonly known as:  DETROL LA  Take 4 mg by mouth every morning.     torsemide 20 MG tablet  Commonly known as:  DEMADEX  Take 1 tablet (20 mg total) by mouth daily.     verapamil 80 MG tablet  Commonly known as:  CALAN  Take 1 tablet (80 mg total) by mouth every 6 (six) hours.     XARELTO 20 MG Tabs  Generic drug:  Rivaroxaban  Take 20 mg by mouth daily.       Follow-up Information   Follow up with Valera Castle, MD On 07/21/2012. (3:45 pm)    Contact information:   [address] [place], [place] [address] [phone]        The results of significant diagnostics from this hospitalization (including imaging, microbiology, ancillary and laboratory) are listed below for reference.    Significant Diagnostic Studies: Dg Chest 2 View  06/27/2012  *RADIOLOGY REPORT*  Clinical Data: Chest pain.  Chest congestion.  CHEST - 2 VIEW  Comparison: 06/13/2012  Findings: The left arm PICC line has been removed since previous study.  The low lung volumes are again demonstrated.  Left lower lung atelectasis or scarring is unchanged.  Cardiomegaly stable. Right lung remains clear.  IMPRESSION: Stable cardiomegaly, low lung volumes, and left lower lung scarring.  No acute findings.   Original Report Authenticated By: Myles Rosenthal, M.D.    Dg Lumbar Spine Complete  06/12/2012  *RADIOLOGY REPORT*  Clinical Data: Fall.  The patient had barium enema performed earlier today.  LUMBAR SPINE - COMPLETE 4+ VIEW  Comparison: Lumbar spine radiographs 08/22/2010  Findings: There is barium in the colon from a barium enema performed earlier today.  Multiple diverticula are filled with barium in the sigmoid colon.  The colon is nondistended.  Lumbar spine vertebral bodies are normal in height and alignment. Disc space narrowing at L4-5 and L5-S1 is stable.  The remainder of the disc heights are preserved.  There is some facet joint degenerative change on the left at L4-L5 and L5-S1.  Negative for fracture.  Portions of some vertebral bodies are obscured by the barium on the frontal  and oblique views.  IMPRESSION:  1.  No acute bony abnormality. 2.  Stable lower lumbar spine degenerative disc disease and facet joint arthropathy.   Original Report Authenticated By: Britta Mccreedy, M.D.    Dg Hip Complete Left  06/13/2012  *RADIOLOGY REPORT*  Clinical Data: Left hip pain and leg pain secondary to a fall.  LEFT HIP - COMPLETE 2+ VIEW  Comparison: None.  Findings: No fracture, dislocation, or significant  degenerative change of the left hip.  IMPRESSION: Normal exam.   Original Report Authenticated By: Francene Boyers, M.D.    Ct Head Wo Contrast  06/27/2012  *RADIOLOGY REPORT*  Clinical Data: Altered mental status.  Blood thinners.  CT HEAD WITHOUT CONTRAST  Technique:  Contiguous axial images were obtained from the base of the skull through the vertex without contrast.  Comparison: CT head without contrast 04/17/2012.  Findings: Atrophy and mild white matter disease is stable.  No acute cortical infarct, hemorrhage, mass lesion is present.  The ventricles are of normal size.  No significant extra-axial fluid collection is present.  Anterior ethmoid and frontal sinus disease is present.  Bilateral mastoid effusions are similar to the prior exam.  No obstructing nasopharyngeal lesion is evident.  IMPRESSION:  1.  Stable mild atrophy white matter disease. 2.  No acute intracranial abnormality. 3.  Mild anterior ethmoid and frontal sinus disease. 4.  Similar bilateral mastoid effusions.  No obstructing nasopharyngeal lesion is evident.   Original Report Authenticated By: Marin Roberts, M.D.    Ct Cervical Spine Wo Contrast  06/15/2012  *RADIOLOGY REPORT*  Clinical Data: Acute onset of bilateral lower extremity weakness. The patient fell 2 days ago.  CT CERVICAL SPINE WITHOUT CONTRAST  Technique:  Multidetector CT imaging of the cervical spine was performed. Multiplanar CT image reconstructions were also generated.  Comparison: CT scan of the cervical spine dated 06/12/2012  Findings: There is solid anterior fusion at C5-6 and C6-7.  There is no acute fracture, subluxation, prevertebral soft tissue swelling, or other acute abnormality.  There is slight degenerative disc disease at C3-4 and C4-5 with the left foraminal stenosis at C4-5 due to uncinate spurs.  Osteophytes touch the ventral aspect of the spinal cord at C5-6 and C6-7.  IMPRESSION: No acute abnormality of the cervical spine.   Original Report  Authenticated By: Francene Boyers, M.D.    Ct Cervical Spine Wo Contrast  06/12/2012  *RADIOLOGY REPORT*  Clinical Data: Fall today  CT CERVICAL SPINE WITHOUT CONTRAST  Technique:  Multidetector CT imaging of the cervical spine was performed. Multiplanar CT image reconstructions were also generated.  Comparison: None.  Findings: Image detail is limited by patient body habitus. Cervical spine is aligned from the skull base through the superior endplate of T2.  There are postoperative changes of anterior cervical discectomy and fusion spanning C5-C7.  No evidence of hardware loosening.  Mild to moderate disc height loss at C3-4 and C4-5.  No acute fracture is identified. Mild left neural foraminal narrowing at C4-C5.  Mild bilateral neural foraminal narrowing at C5-C6 and C6-C7.  Mild left neural foraminal narrowing at C7-T1.  The prevertebral soft tissue contours appear within normal limits.  IMPRESSION: 1.  No evidence of acute bony injury to the cervical spine. 2.  Anterior cervical discectomy and fusion spanning C5 this and seven. 3.  Degenerative changes as described above.   Original Report Authenticated By: Britta Mccreedy, M.D.    Ct Thoracic Spine Wo Contrast  06/15/2012  *RADIOLOGY REPORT*  Clinical  Data: Acute onset of bilateral lower extremity weakness after fall 2 days ago.  CT THORACIC SPINE WITHOUT CONTRAST  Technique:  Multidetector CT imaging of the thoracic spine was performed without intravenous contrast administration. Multiplanar CT image reconstructions were also generated  Comparison: None.  Findings: There is no visible fracture or subluxation.  There is no paravertebral soft tissue stranding.  No visible disc protrusions. Flowing anterior osteophytes appear to fuse the thoracic spine from T4 to at least T11.  IMPRESSION: No acute abnormality.  Probable anterior fusion from T4- T11 by osteophytes.   Original Report Authenticated By: Francene Boyers, M.D.    Mr Brain Wo Contrast  06/30/2012   *RADIOLOGY REPORT*  Clinical Data: Altered mental status  MRI HEAD WITHOUT CONTRAST  Technique:  Multiplanar, multiecho pulse sequences of the brain and surrounding structures were obtained according to standard protocol without intravenous contrast.  Comparison: CT head 06/27/2012  Findings: Image quality limited due to large patient size with difficulty positioning the patient in the head coil.  There is mild motion.  Negative for acute infarct.  Mild chronic microvascular ischemic changes in the white matter.  No cortical infarct.  Brainstem is normal.  Negative for mass or edema.  No shift of the midline structures. Negative for intracranial hemorrhage.  Paranasal sinuses are clear.  Bilateral mastoid sinus effusion.  IMPRESSION: No acute intracranial abnormality.  Mild chronic microvascular ischemia in the white matter.  Bilateral mastoid sinus effusion.   Original Report Authenticated By: Janeece Riggers, M.D.    Mr Lumbar Spine Wo Contrast  06/14/2012  *RADIOLOGY REPORT*  Clinical Data: Bilateral lower extremity weakness.  MRI LUMBAR SPINE WITHOUT CONTRAST  Technique:  Multiplanar and multiecho pulse sequences of the lumbar spine were obtained without intravenous contrast.  Comparison: None.  Findings: Normal alignment of the lumbar vertebral bodies.  They demonstrate normal marrow signal.  The conus medullaris terminates at L1.  Normal MR appearance of the distal thoracic cord.  The spinal canal is generous.  No significant spinal stenosis.  No significant disc protrusions.  Mild bulging annulus at L4-5 with minimal foraminal encroachment bilaterally.  Mild foraminal encroachment on the left at L5-S1 also.  IMPRESSION: Unremarkable lumbar spine MRI examination.  No findings to account for the patient's symptoms.   Original Report Authenticated By: Rudie Meyer, M.D.    US Renal  06/14/2012  *RADIOLOGY REPORT*  Clinical Data: Acute renal failure.  RENAL/URINARY TRACT ULTRASOUND COMPLETE  Comparison:  CT  scan 06/16/2006  Findings:  Right Kidney:  11.6 cm in length.  Mild renal cortical thinning but normal echogenicity.  No hydronephrosis, renal mass or renal calculi.  Left Kidney:  11.8 cm in length.  Mild renal cortical thinning but normal echogenicity.  No focal lesions, hydronephrosis or renal calculi.  Bladder:  Normal.  IMPRESSION: Mild renal cortical thinning but no hydronephrosis or renal mass.   Original Report Authenticated By: Rudie Meyer, M.D.    Dg Chest Port 1 View  07/03/2012  *RADIOLOGY REPORT*  Clinical Data: CHF, morbid obesity  PORTABLE CHEST - 1 VIEW  Comparison: Portable exam 1451 hours compared to 06/27/2012  Findings: Rotation to the left. Enlargement of cardiac silhouette. Mediastinal contours and pulmonary vascularity normal. Question tortuous thoracic aorta. Bibasilar atelectasis. Upper lungs clear. No gross pleural effusion or pneumothorax. Prior cervical spine fusion.  IMPRESSION: Enlargement of cardiac silhouette. Bibasilar atelectasis.   Original Report Authenticated By: Ulyses Southward, M.D.    Dg Chest Port 1 View  06/13/2012  *RADIOLOGY REPORT*  Clinical Data: Post PICC line insertion  PORTABLE CHEST - 1 VIEW  Comparison: 09/11/2011  Findings: Grossly unchanged enlarged cardiac silhouette mediastinal contours. Left upper extremity approach PICC line terminates over the distal SVC.  Grossly unchanged bibasilar heterogeneous opacities, left greater than right.  No new focal airspace opacities.  No definite pleural effusion or pneumothorax. Unchanged bones of the lower cervical ACDF, incompletely evaluated.  IMPRESSION: 1.  Left upper extremity approach PICC line tip terminates over the distal SVC. 2.  Unchanged cardiomegaly.  3.  Persistently reduced lung volumes with grossly unchanged bibasilar opacities, left greater than right, atelectasis versus infiltrate.   Original Report Authenticated By: Tacey Ruiz, MD    Dg Knee Complete 4 Views Left  06/13/2012  *RADIOLOGY REPORT*   Clinical Data: Left knee pain secondary to a fall.  LEFT KNEE - COMPLETE 4+ VIEW  Comparison: 07/25/2006  Findings: There is no fracture or dislocation.  No joint effusion. Progression of fairly severe osteoarthritis of the lateral compartment and to a lesser degree in the medial compartment.  IMPRESSION: No acute abnormality.  Progressive osteoarthritis.   Original Report Authenticated By: Francene Boyers, M.D.    Dg Abd 2 Views  06/27/2012  *RADIOLOGY REPORT*  Clinical Data: Abdominal pain and distention.  Constipation. Morbid obesity.  ABDOMEN - 2 VIEW  Comparison: None.  Findings: Exam is technically suboptimal due to morbid obesity.  No evidence of dilated bowel loops.  Residual contrast is seen within diverticula in the sigmoid colon.  No evidence of free air.  Right upper quadrant surgical clips are seen from prior cholecystectomy.  IMPRESSION:  1.  No acute findings. 2.  Sigmoid diverticulosis.   Original Report Authenticated By: Myles Rosenthal, M.D.    Dg Colon W/cm - Wo/w Kub  06/12/2012  *RADIOLOGY REPORT*  Clinical Data: Heme positive stools  BARIUM ENEMA:  Technique: After insertion of an enema tip, barium was instilled retrograde into the colon under gravity drip.  Multiple images of the colon were obtained. Exam is aeration is limited by body habitus and limited patient mobility.  Fluoroscopy time: 4.4 minutes  Comparison: None  Findings: Normal bowel gas pattern on scout image. Normal retrograde passage of contrast from the rectum to the cecal tip. Appendix surgically absent. Competent ileocecal valve. Diverticulosis of sigmoid colon. Patient was unable to tolerate any significant degree colonic distention and incontinent of contrast and expelled the enema tip/balloon once during the exam. No gross narrowing or stricture identified. Tiny filling defect identified at the left lateral aspect of the rectum on a single image question tiny polyp versus artifact. No definite or additional persistent  intraluminal filling defects. Smooth appearance of colonic mucosa on single contrast exam.  IMPRESSION: Sigmoid diverticulosis. Questionable tiny left lateral rectal polyp.   Original Report Authenticated By: Ulyses Southward, M.D.     Microbiology: Recent Results (from the past 240 hour(s))  URINE CULTURE     Status: None   Collection Time    06/27/12  7:43 PM      Result Value Range Status   Specimen Description URINE, CATHETERIZED   Final   Special Requests NONE   Final   Culture  Setup Time 06/28/2012 20:42   Final   Colony Count NO GROWTH   Final   Culture NO GROWTH   Final   Report Status 06/29/2012 FINAL   Final  URINE CULTURE     Status: None   Collection Time    06/28/12  9:17 AM  Result Value Range Status   Specimen Description URINE, CLEAN CATCH   Final   Special Requests NONE   Final   Culture  Setup Time 06/28/2012 20:42   Final   Colony Count NO GROWTH   Final   Culture NO GROWTH   Final   Report Status 06/29/2012 FINAL   Final  MRSA PCR SCREENING     Status: Abnormal   Collection Time    07/02/12  2:15 PM      Result Value Range Status   MRSA by PCR POSITIVE (*) NEGATIVE Final   Comment:            The GeneXpert MRSA Assay (FDA     approved for NASAL specimens     only), is one component of a     comprehensive MRSA colonization     surveillance program. It is not     intended to diagnose MRSA     infection nor to guide or     monitor treatment for     MRSA infections.     RESULT CALLED TO, READ BACK BY AND VERIFIED WITH:     PHILLIPS,C. AT 1740 ON 07/02/2012 BY BAUGHAM,M.     Labs: Basic Metabolic Panel:  Recent Labs Lab 07/02/12 0536 07/03/12 0509 07/04/12 0445 07/05/12 0449 07/05/12 2238 07/06/12 0445  NA 140 136 137 137  --  137  K 3.7 3.7 3.8 4.3  --  5.1  CL 103 98 96 96  --  98  CO2 29 29 33* 34*  --  32  GLUCOSE 240* 319* 272* 259* 392* 288*  BUN 12 13 16 20   --  23  CREATININE 1.49* 1.30* 1.28* 1.23*  --  1.04  CALCIUM 8.3* 8.4 8.8  9.1  --  9.4  PHOS  --  2.4  --  3.0  --   --    Liver Function Tests: No results found for this basename: AST, ALT, ALKPHOS, BILITOT, PROT, ALBUMIN,  in the last 168 hours No results found for this basename: LIPASE, AMYLASE,  in the last 168 hours No results found for this basename: AMMONIA,  in the last 168 hours CBC:  Recent Labs Lab 07/02/12 0536 07/03/12 0509 07/04/12 0445 07/05/12 0449 07/06/12 0445  WBC 7.2 10.2 10.4 9.9 9.7  HGB 7.6* 9.1* 9.3* 8.9* 9.0*  HCT 26.5* 30.6* 31.1* 29.7* 30.1*  MCV 86.3 84.8 86.1 87.4 88.5  PLT 215 231 235 216 223   Cardiac Enzymes: No results found for this basename: CKTOTAL, CKMB, CKMBINDEX, TROPONINI,  in the last 168 hours BNP: BNP (last 3 results)  Recent Labs  07/03/12 0509  PROBNP 1534.0*   CBG:  Recent Labs Lab 07/05/12 1630 07/05/12 2050 07/05/12 2234 07/06/12 0811 07/06/12 1237  GLUCAP 378* 406* 402* 231* 254*       Signed:  Toya Smothers M  [facility] 07/07/2012, 7:41 AM  Attending note:  Patient seen and examined on the day of discharge.  Agree with note as above.

## 2012-07-08 DIAGNOSIS — E119 Type 2 diabetes mellitus without complications: Secondary | ICD-10-CM | POA: Diagnosis not present

## 2012-07-08 DIAGNOSIS — L03119 Cellulitis of unspecified part of limb: Secondary | ICD-10-CM | POA: Diagnosis not present

## 2012-07-08 DIAGNOSIS — L89309 Pressure ulcer of unspecified buttock, unspecified stage: Secondary | ICD-10-CM | POA: Diagnosis not present

## 2012-07-08 DIAGNOSIS — A499 Bacterial infection, unspecified: Secondary | ICD-10-CM | POA: Diagnosis not present

## 2012-07-08 DIAGNOSIS — I2510 Atherosclerotic heart disease of native coronary artery without angina pectoris: Secondary | ICD-10-CM | POA: Diagnosis not present

## 2012-07-08 DIAGNOSIS — N390 Urinary tract infection, site not specified: Secondary | ICD-10-CM | POA: Diagnosis not present

## 2012-07-11 ENCOUNTER — Other Ambulatory Visit: Payer: Self-pay

## 2012-07-11 DIAGNOSIS — I2510 Atherosclerotic heart disease of native coronary artery without angina pectoris: Secondary | ICD-10-CM | POA: Diagnosis not present

## 2012-07-11 DIAGNOSIS — A499 Bacterial infection, unspecified: Secondary | ICD-10-CM | POA: Diagnosis not present

## 2012-07-11 DIAGNOSIS — L03119 Cellulitis of unspecified part of limb: Secondary | ICD-10-CM | POA: Diagnosis not present

## 2012-07-11 DIAGNOSIS — L89309 Pressure ulcer of unspecified buttock, unspecified stage: Secondary | ICD-10-CM | POA: Diagnosis not present

## 2012-07-11 DIAGNOSIS — N390 Urinary tract infection, site not specified: Secondary | ICD-10-CM | POA: Diagnosis not present

## 2012-07-11 DIAGNOSIS — E119 Type 2 diabetes mellitus without complications: Secondary | ICD-10-CM | POA: Diagnosis not present

## 2012-07-13 DIAGNOSIS — L89309 Pressure ulcer of unspecified buttock, unspecified stage: Secondary | ICD-10-CM | POA: Diagnosis not present

## 2012-07-13 DIAGNOSIS — A499 Bacterial infection, unspecified: Secondary | ICD-10-CM | POA: Diagnosis not present

## 2012-07-13 DIAGNOSIS — E119 Type 2 diabetes mellitus without complications: Secondary | ICD-10-CM | POA: Diagnosis not present

## 2012-07-13 DIAGNOSIS — I2510 Atherosclerotic heart disease of native coronary artery without angina pectoris: Secondary | ICD-10-CM | POA: Diagnosis not present

## 2012-07-13 DIAGNOSIS — L02419 Cutaneous abscess of limb, unspecified: Secondary | ICD-10-CM | POA: Diagnosis not present

## 2012-07-13 DIAGNOSIS — N390 Urinary tract infection, site not specified: Secondary | ICD-10-CM | POA: Diagnosis not present

## 2012-07-13 NOTE — Discharge Summary (Signed)
Physician Discharge Summary  Ann [name] WGN:[id number] DOB: [birth year] DOA: 06/27/2012  PCP: [name], MD  Admit date: 06/27/2012 Discharge date: 07/13/2012  Time spent: 40 minutes  Recommendations for Outpatient Follow-up:  1. Follow up with Dr. Daleen Squibb. Has apt 07/21/12 2. HH RN to monitor CBG as have been trending high in hospital somewhat attributable to steroids. Insulin adjusted accordingly. 3. Patient will need outpatient sleep study. This can be arranged by primary care doctor   Discharge Diagnoses:  Principal Problem:   Acute encephalopathy Active Problems:   HYPOTHYROIDISM   HYPERTENSION   CAD   Diabetes mellitus type 2 with complications   Adrenal insufficiency   DVT (deep venous thrombosis)   ARF (acute renal failure)   UTI (lower urinary tract infection)   Chronic anticoagulation   Hypokalemia   Shortness of breath   Vitamin B12 deficiency   Sinusitis chronic, frontal   Bilateral lower extremity edema   Left leg cellulitis   Morbid obesity   Chronic kidney disease, stage III (moderate)   Lethargy   Atrial fibrillation with RVR   Discharge Condition: Stable and ready for discharge to home with [facility].  Diet recommendation: carb modified and heart healthy  Filed Weights   07/04/12 0500 07/05/12 0500 07/06/12 0457  Weight: 138.3 kg (304 lb 14.3 oz) 141.1 kg (311 lb 1.1 oz) 141.1 kg (311 lb 1.1 oz)    History of present illness:  Ann [name] is a 67 y.o. female with a past medical history of obesity, hypertension, DVT, on anticoagulation, hypothyroidism, COPD, who was recently hospitalized in January for acute renal failure presented to [place] on 06/27/12 with cc confusion and decreased urination. On previous hospitalization she also complained of back pain, which prompted transfer to [facility] for MRI. MRI did not show any acute abnormalities. Patient subsequently, was discharged home. Family reported that 2 days prior to [facility]  confused. She was seeing things that were not there. She was talking about things from the past as if they were happening now. Had some chills at home and felt hot earlier today. Had been having difficulties with urination. Complained of burning with urination. Had been constipated for the prior 3 days. She also had some shortness of breath with a cough. She did  have chronic cough. She has chronic lower extremity edema. She was initiated on diuretics recently. She also has a history of adrenal insufficiency and is on steroids chronically. She also mentioned she had abdominal pain in the lower abdomen. Was unable to characterize any further. So, poor history because of patient's confusion.      Hospital Course:  Atrial fibrillation with RVR. Patient went into new onset rapid atrial fibrillation on 07/02/12, requiring transfer to the step down unit. She was asymptomatic through this. She was started on a cardizem infusion for heart rate control and since then has been weaned off. She was transitioned to oral verapamil and digoxin and heart rate controlled in 70s-80s. Echo shows hyperdynamic EF. Cardiology consulted. She is anticoagulated on xarelto. Will follow up with Dr Daleen Squibb 07/21/12. Acute encephalopathy. Likely related to UTI. Hydrated and given antibiotics. Seen by neurology. Resolved at baseline at discharge.  No further work up needed per neurology.  Urinary tract infection. Completed a course of rocephin. Urine culture reveals no growth. Reports less burning on urination.  Chronic ethmoid and frontal sinusitis. Possible mastoiditis. Much improved at discharge. Continue steroid nasal spray and as needed nasal saline.  Anemia with  associated vitamin B12 deficiency. Anemia panel from 06/13/2012 revealed vitamin B12 low at 206, normal folate at 6.5, total iron mildly low at 20, normal TIBC of 351, low-normal ferritin of 42, and normal total reticulocyte count of 95. Patient recieved IV iron per nephrology  and received 1 unit PRBC for symptomatic anemia. Her hemoglobin has responded appropriately. Hemoccult stools are negative. Recommend Op follow up. At discharge Hg stable at baseline 9.0. Sigmoid diverticulosis and questionable rectal polyp. Per barium enema study on 06/08/2012 ordered by GI Dr. Russella Dar. Apparently, the patient was too high risk for colonoscopy per family.  Acute renal failure, superimposed on chronic kidney disease, likely stage III. It is likely she has stage III chronic kidney disease secondary to a combination of hypertension and diabetes mellitus. Seen by nephrology. Creatinine stable. Current level in baseline range. Her diuretics have been changed to oral demadex. At discharge creatinine 1.04. Hypokalemia. Resolved following potassium chloride supplementation. Magnesium level is within normal limits. Hypokalemia may be secondary to chronic diuretic therapy.  History of left lower extremity DVT. She is anticoagulated with rivaroxaban. She was diagnosed with left lower extremity DVT per ultrasound in November 2013.   Bilateral lower extremity edema with erythema. The edema is likely multifactorial i.e. Lymphedema in setting of morbid obesity, sedentary lifestyle and addisons disease.  It is likely that her LE erythema is due to venous stasis than an infective process. Antibiotics were discontinued. Diuretic therapy was started by Dr. Kristian Covey. 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 Her CBGs were trending upward, likely secondary to increased steroids. Lantus and sliding scale NovoLog adjusted. Hemoglobin A1c is 7.5. Will be discharged on higher dose lantus, continue meal coverage. Will also have HH RN to assist with monitoring CBG's COPD. query obstructive sleep apnea. Patient had wheezing throughout hospitalization. Suspect that she has a chronic wheeze. She received nebs and steroids and gradually improved to baseline. At discharge sat 96% at rest  and with ambulation on room air. Will discharge with steroid taper. Continue nebs at home.  CAD/hypertension. No complaints CP. Troponin I is negative x2. BP controlled. Continue current regimen.  Hypothyroidism. We'll continue Synthroid. TSH is mildly elevated, but her free T4 is within normal limits. Will maintain Synthroid at the same dose. Recommend OP follow up.  Chronic adrenal insufficiency. On chronic prednisone. The patient was given to stress doses of IV hydrocortisone 06/29/12. Restarted prednisone 06/30/12, but at a slightly higher dose.  Will taper back to  5 mg daily.  Morbid obesity and deconditioning. Home health therapy recommended by the therapists.  Cerumen impaction. This could be the cause of her difficulty with hearing. Given drops. Recommend OP follow up.      Procedures:none Consultations:  Cardiology  Neurology  nephrology    Discharge Exam: Filed Vitals:   07/06/12 1032 07/06/12 1043 07/06/12 1239 07/06/12 1428  BP:   102/57   Pulse:  80    Temp:      TempSrc:      Resp:      Height:      Weight:      SpO2: 96%   94%    General: obese awake NAD Cardiovascular: irregularly irregular 1+LEE Respiratory: increased work of breathing with ambulation. Diminished BS faint wheeze bilaterally  Discharge Instructions      Discharge Orders   Future Appointments Provider Department Dept Phone   07/21/2012 3:45 PM Gaylord Shih, MD [facility] [phone]   Future Orders Complete  By Expires     Diet - low sodium heart healthy  As directed     Increase activity slowly  As directed         Medication List    STOP taking these medications       furosemide 20 MG tablet  Commonly known as:  LASIX     metoprolol succinate 50 MG 24 hr tablet  Commonly known as:  TOPROL-XL      TAKE these medications       albuterol 108 (90 BASE) MCG/ACT inhaler  Commonly known as:  PROVENTIL HFA;VENTOLIN HFA  Inhale 2 puffs into the lungs 2 (two)  times daily. For shortness of breath     ALPRAZolam 0.5 MG tablet  Commonly known as:  XANAX  Take 1 tablet (0.5 mg total) by mouth 3 (three) times daily as needed. For anxiety     aspirin EC 81 MG tablet  Take 81 mg by mouth daily.     benzonatate 100 MG capsule  Commonly known as:  TESSALON  Take 1 capsule (100 mg total) by mouth 3 (three) times daily.     calcitRIOL 0.25 MCG capsule  Commonly known as:  ROCALTROL  Take 1 capsule (0.25 mcg total) by mouth daily.     carbamide peroxide 6.5 % otic solution  Commonly known as:  DEBROX  Place 5 drops into both ears 2 (two) times daily.     cyanocobalamin 1000 MCG tablet  Take 1 tablet (1,000 mcg total) by mouth daily.     digoxin 0.125 MG tablet  Commonly known as:  LANOXIN  Take 1 tablet (0.125 mg total) by mouth daily.     esomeprazole 40 MG capsule  Commonly known as:  NEXIUM  Take 40 mg by mouth 2 (two) times daily.     fluticasone 50 MCG/ACT nasal spray  Commonly known as:  FLONASE  Place 1 spray into the nose daily.     Fluticasone-Salmeterol 250-50 MCG/DOSE Aepb  Commonly known as:  ADVAIR  Inhale 1 puff into the lungs every 12 (twelve) hours.     gabapentin 100 MG capsule  Commonly known as:  NEURONTIN  Take 100 mg by mouth 3 (three) times daily.     HYDROcodone-acetaminophen 5-325 MG per tablet  Commonly known as:  NORCO/VICODIN  Take 1 tablet by mouth every 4 (four) hours as needed.     insulin aspart 100 UNIT/ML injection  Commonly known as:  novoLOG  Inject 10 Units into the skin 3 (three) times daily with meals.     insulin detemir 100 UNIT/ML injection  Commonly known as:  LEVEMIR FLEXPEN  Inject 60 Units into the skin at bedtime.     ipratropium 0.02 % nebulizer solution  Commonly known as:  ATROVENT  Take 2.5 mLs (0.5 mg total) by nebulization every 6 (six) hours.     iron polysaccharides 150 MG capsule  Commonly known as:  NIFEREX  Take 1 capsule (150 mg total) by mouth 2 (two) times daily.      levalbuterol 0.63 MG/3ML nebulizer solution  Commonly known as:  XOPENEX  Take 3 mLs (0.63 mg total) by nebulization every 6 (six) hours.     levothyroxine 200 MCG tablet  Commonly known as:  SYNTHROID, LEVOTHROID  Take 200 mcg by mouth daily.     linagliptin 5 MG Tabs tablet  Commonly known as:  TRADJENTA  Take 5 mg by mouth every morning.     nitroGLYCERIN 0.4 MG SL  tablet  Commonly known as:  NITROSTAT  Place 1 tablet (0.4 mg total) under the tongue every 5 (five) minutes as needed for chest pain.     polyethylene glycol packet  Commonly known as:  MIRALAX / GLYCOLAX  Take 17 g by mouth daily.     predniSONE 10 MG tablet  Commonly known as:  DELTASONE  Take 3 tabs 07/07/12 take 2/tabs 2/12 and 2/13 take 1 tab 2/14 and 2/15 then take half a tablet daily.     predniSONE 5 MG tablet  Commonly known as:  DELTASONE  Take 1 tablet (5 mg total) by mouth daily.     predniSONE 5 MG tablet  Commonly known as:  DELTASONE  Take 1 tablet (5 mg total) by mouth daily.     rosuvastatin 40 MG tablet  Commonly known as:  CRESTOR  Take 1 tablet (40 mg total) by mouth at bedtime.     sertraline 50 MG tablet  Commonly known as:  ZOLOFT  Take 50 mg by mouth daily.     sodium chloride 0.65 % Soln nasal spray  Commonly known as:  OCEAN  Place 1 spray into the nose as needed for congestion.     tolterodine 4 MG 24 hr capsule  Commonly known as:  DETROL LA  Take 4 mg by mouth every morning.     torsemide 20 MG tablet  Commonly known as:  DEMADEX  Take 1 tablet (20 mg total) by mouth daily.     verapamil 80 MG tablet  Commonly known as:  CALAN  Take 1 tablet (80 mg total) by mouth every 6 (six) hours.     XARELTO 20 MG Tabs  Generic drug:  Rivaroxaban  Take 20 mg by mouth daily.       Follow-up Information   Follow up with Valera Castle, MD On 07/21/2012. (3:45 pm)    Contact information:   [address] [place], [place] [address] [phone]        The results  of significant diagnostics from this hospitalization (including imaging, microbiology, ancillary and laboratory) are listed below for reference.    Significant Diagnostic Studies: Dg Chest 2 View  06/27/2012  *RADIOLOGY REPORT*  Clinical Data: Chest pain.  Chest congestion.  CHEST - 2 VIEW  Comparison: 06/13/2012  Findings: The left arm PICC line has been removed since previous study.  The low lung volumes are again demonstrated.  Left lower lung atelectasis or scarring is unchanged.  Cardiomegaly stable. Right lung remains clear.  IMPRESSION: Stable cardiomegaly, low lung volumes, and left lower lung scarring.  No acute findings.   Original Report Authenticated By: Myles Rosenthal, M.D.    Dg Lumbar Spine Complete  06/12/2012  *RADIOLOGY REPORT*  Clinical Data: Fall.  The patient had barium enema performed earlier today.  LUMBAR SPINE - COMPLETE 4+ VIEW  Comparison: Lumbar spine radiographs 08/22/2010  Findings: There is barium in the colon from a barium enema performed earlier today.  Multiple diverticula are filled with barium in the sigmoid colon.  The colon is nondistended.  Lumbar spine vertebral bodies are normal in height and alignment. Disc space narrowing at L4-5 and L5-S1 is stable.  The remainder of the disc heights are preserved.  There is some facet joint degenerative change on the left at L4-L5 and L5-S1.  Negative for fracture.  Portions of some vertebral bodies are obscured by the barium on the frontal and oblique views.  IMPRESSION:  1.  No acute bony abnormality.  2.  Stable lower lumbar spine degenerative disc disease and facet joint arthropathy.   Original Report Authenticated By: Britta Mccreedy, M.D.    Dg Hip Complete Left  06/13/2012  *RADIOLOGY REPORT*  Clinical Data: Left hip pain and leg pain secondary to a fall.  LEFT HIP - COMPLETE 2+ VIEW  Comparison: None.  Findings: No fracture, dislocation, or significant degenerative change of the left hip.  IMPRESSION: Normal exam.   Original  Report Authenticated By: Francene Boyers, M.D.    Ct Head Wo Contrast  06/27/2012  *RADIOLOGY REPORT*  Clinical Data: Altered mental status.  Blood thinners.  CT HEAD WITHOUT CONTRAST  Technique:  Contiguous axial images were obtained from the base of the skull through the vertex without contrast.  Comparison: CT head without contrast 04/17/2012.  Findings: Atrophy and mild white matter disease is stable.  No acute cortical infarct, hemorrhage, mass lesion is present.  The ventricles are of normal size.  No significant extra-axial fluid collection is present.  Anterior ethmoid and frontal sinus disease is present.  Bilateral mastoid effusions are similar to the prior exam.  No obstructing nasopharyngeal lesion is evident.  IMPRESSION:  1.  Stable mild atrophy white matter disease. 2.  No acute intracranial abnormality. 3.  Mild anterior ethmoid and frontal sinus disease. 4.  Similar bilateral mastoid effusions.  No obstructing nasopharyngeal lesion is evident.   Original Report Authenticated By: Marin Roberts, M.D.    Ct Cervical Spine Wo Contrast  06/15/2012  *RADIOLOGY REPORT*  Clinical Data: Acute onset of bilateral lower extremity weakness. The patient fell 2 days ago.  CT CERVICAL SPINE WITHOUT CONTRAST  Technique:  Multidetector CT imaging of the cervical spine was performed. Multiplanar CT image reconstructions were also generated.  Comparison: CT scan of the cervical spine dated 06/12/2012  Findings: There is solid anterior fusion at C5-6 and C6-7.  There is no acute fracture, subluxation, prevertebral soft tissue swelling, or other acute abnormality.  There is slight degenerative disc disease at C3-4 and C4-5 with the left foraminal stenosis at C4-5 due to uncinate spurs.  Osteophytes touch the ventral aspect of the spinal cord at C5-6 and C6-7.  IMPRESSION: No acute abnormality of the cervical spine.   Original Report Authenticated By: Francene Boyers, M.D.    Ct Cervical Spine Wo  Contrast  06/12/2012  *RADIOLOGY REPORT*  Clinical Data: Fall today  CT CERVICAL SPINE WITHOUT CONTRAST  Technique:  Multidetector CT imaging of the cervical spine was performed. Multiplanar CT image reconstructions were also generated.  Comparison: None.  Findings: Image detail is limited by patient body habitus. Cervical spine is aligned from the skull base through the superior endplate of T2.  There are postoperative changes of anterior cervical discectomy and fusion spanning C5-C7.  No evidence of hardware loosening.  Mild to moderate disc height loss at C3-4 and C4-5.  No acute fracture is identified. Mild left neural foraminal narrowing at C4-C5.  Mild bilateral neural foraminal narrowing at C5-C6 and C6-C7.  Mild left neural foraminal narrowing at C7-T1.  The prevertebral soft tissue contours appear within normal limits.  IMPRESSION: 1.  No evidence of acute bony injury to the cervical spine. 2.  Anterior cervical discectomy and fusion spanning C5 this and seven. 3.  Degenerative changes as described above.   Original Report Authenticated By: Britta Mccreedy, M.D.    Ct Thoracic Spine Wo Contrast  06/15/2012  *RADIOLOGY REPORT*  Clinical Data: Acute onset of bilateral lower extremity weakness after fall 2 days  ago.  CT THORACIC SPINE WITHOUT CONTRAST  Technique:  Multidetector CT imaging of the thoracic spine was performed without intravenous contrast administration. Multiplanar CT image reconstructions were also generated  Comparison: None.  Findings: There is no visible fracture or subluxation.  There is no paravertebral soft tissue stranding.  No visible disc protrusions. Flowing anterior osteophytes appear to fuse the thoracic spine from T4 to at least T11.  IMPRESSION: No acute abnormality.  Probable anterior fusion from T4- T11 by osteophytes.   Original Report Authenticated By: Francene Boyers, M.D.    Mr Brain Wo Contrast  06/30/2012  *RADIOLOGY REPORT*  Clinical Data: Altered mental status  MRI HEAD  WITHOUT CONTRAST  Technique:  Multiplanar, multiecho pulse sequences of the brain and surrounding structures were obtained according to standard protocol without intravenous contrast.  Comparison: CT head 06/27/2012  Findings: Image quality limited due to large patient size with difficulty positioning the patient in the head coil.  There is mild motion.  Negative for acute infarct.  Mild chronic microvascular ischemic changes in the white matter.  No cortical infarct.  Brainstem is normal.  Negative for mass or edema.  No shift of the midline structures. Negative for intracranial hemorrhage.  Paranasal sinuses are clear.  Bilateral mastoid sinus effusion.  IMPRESSION: No acute intracranial abnormality.  Mild chronic microvascular ischemia in the white matter.  Bilateral mastoid sinus effusion.   Original Report Authenticated By: Janeece Riggers, M.D.    Mr Lumbar Spine Wo Contrast  06/14/2012  *RADIOLOGY REPORT*  Clinical Data: Bilateral lower extremity weakness.  MRI LUMBAR SPINE WITHOUT CONTRAST  Technique:  Multiplanar and multiecho pulse sequences of the lumbar spine were obtained without intravenous contrast.  Comparison: None.  Findings: Normal alignment of the lumbar vertebral bodies.  They demonstrate normal marrow signal.  The conus medullaris terminates at L1.  Normal MR appearance of the distal thoracic cord.  The spinal canal is generous.  No significant spinal stenosis.  No significant disc protrusions.  Mild bulging annulus at L4-5 with minimal foraminal encroachment bilaterally.  Mild foraminal encroachment on the left at L5-S1 also.  IMPRESSION: Unremarkable lumbar spine MRI examination.  No findings to account for the patient's symptoms.   Original Report Authenticated By: Rudie Meyer, M.D.    US Renal  06/14/2012  *RADIOLOGY REPORT*  Clinical Data: Acute renal failure.  RENAL/URINARY TRACT ULTRASOUND COMPLETE  Comparison:  CT scan 06/16/2006  Findings:  Right Kidney:  11.6 cm in length.  Mild  renal cortical thinning but normal echogenicity.  No hydronephrosis, renal mass or renal calculi.  Left Kidney:  11.8 cm in length.  Mild renal cortical thinning but normal echogenicity.  No focal lesions, hydronephrosis or renal calculi.  Bladder:  Normal.  IMPRESSION: Mild renal cortical thinning but no hydronephrosis or renal mass.   Original Report Authenticated By: Rudie Meyer, M.D.    Dg Chest Port 1 View  07/03/2012  *RADIOLOGY REPORT*  Clinical Data: CHF, morbid obesity  PORTABLE CHEST - 1 VIEW  Comparison: Portable exam 1451 hours compared to 06/27/2012  Findings: Rotation to the left. Enlargement of cardiac silhouette. Mediastinal contours and pulmonary vascularity normal. Question tortuous thoracic aorta. Bibasilar atelectasis. Upper lungs clear. No gross pleural effusion or pneumothorax. Prior cervical spine fusion.  IMPRESSION: Enlargement of cardiac silhouette. Bibasilar atelectasis.   Original Report Authenticated By: Ulyses Southward, M.D.    Dg Chest Port 1 View  06/13/2012  *RADIOLOGY REPORT*  Clinical Data: Post PICC line insertion  PORTABLE CHEST - 1  VIEW  Comparison: 09/11/2011  Findings: Grossly unchanged enlarged cardiac silhouette mediastinal contours. Left upper extremity approach PICC line terminates over the distal SVC.  Grossly unchanged bibasilar heterogeneous opacities, left greater than right.  No new focal airspace opacities.  No definite pleural effusion or pneumothorax. Unchanged bones of the lower cervical ACDF, incompletely evaluated.  IMPRESSION: 1.  Left upper extremity approach PICC line tip terminates over the distal SVC. 2.  Unchanged cardiomegaly.  3.  Persistently reduced lung volumes with grossly unchanged bibasilar opacities, left greater than right, atelectasis versus infiltrate.   Original Report Authenticated By: Tacey Ruiz, MD    Dg Knee Complete 4 Views Left  06/13/2012  *RADIOLOGY REPORT*  Clinical Data: Left knee pain secondary to a fall.  LEFT KNEE - COMPLETE  4+ VIEW  Comparison: 07/25/2006  Findings: There is no fracture or dislocation.  No joint effusion. Progression of fairly severe osteoarthritis of the lateral compartment and to a lesser degree in the medial compartment.  IMPRESSION: No acute abnormality.  Progressive osteoarthritis.   Original Report Authenticated By: Francene Boyers, M.D.    Dg Abd 2 Views  06/27/2012  *RADIOLOGY REPORT*  Clinical Data: Abdominal pain and distention.  Constipation. Morbid obesity.  ABDOMEN - 2 VIEW  Comparison: None.  Findings: Exam is technically suboptimal due to morbid obesity.  No evidence of dilated bowel loops.  Residual contrast is seen within diverticula in the sigmoid colon.  No evidence of free air.  Right upper quadrant surgical clips are seen from prior cholecystectomy.  IMPRESSION:  1.  No acute findings. 2.  Sigmoid diverticulosis.   Original Report Authenticated By: Myles Rosenthal, M.D.    Dg Colon W/cm - Wo/w Kub  06/12/2012  *RADIOLOGY REPORT*  Clinical Data: Heme positive stools  BARIUM ENEMA:  Technique: After insertion of an enema tip, barium was instilled retrograde into the colon under gravity drip.  Multiple images of the colon were obtained. Exam is aeration is limited by body habitus and limited patient mobility.  Fluoroscopy time: 4.4 minutes  Comparison: None  Findings: Normal bowel gas pattern on scout image. Normal retrograde passage of contrast from the rectum to the cecal tip. Appendix surgically absent. Competent ileocecal valve. Diverticulosis of sigmoid colon. Patient was unable to tolerate any significant degree colonic distention and incontinent of contrast and expelled the enema tip/balloon once during the exam. No gross narrowing or stricture identified. Tiny filling defect identified at the left lateral aspect of the rectum on a single image question tiny polyp versus artifact. No definite or additional persistent intraluminal filling defects. Smooth appearance of colonic mucosa on single  contrast exam.  IMPRESSION: Sigmoid diverticulosis. Questionable tiny left lateral rectal polyp.   Original Report Authenticated By: Ulyses Southward, M.D.     Microbiology: No results found for this or any previous visit (from the past 240 hour(s)).   Labs: Basic Metabolic Panel: No results found for this basename: NA, K, CL, CO2, GLUCOSE, BUN, CREATININE, CALCIUM, MG, PHOS,  in the last 168 hours Liver Function Tests: No results found for this basename: AST, ALT, ALKPHOS, BILITOT, PROT, ALBUMIN,  in the last 168 hours No results found for this basename: LIPASE, AMYLASE,  in the last 168 hours No results found for this basename: AMMONIA,  in the last 168 hours CBC: No results found for this basename: WBC, NEUTROABS, HGB, HCT, MCV, PLT,  in the last 168 hours Cardiac Enzymes: No results found for this basename: CKTOTAL, CKMB, CKMBINDEX, TROPONINI,  in the  last 168 hours BNP: BNP (last 3 results)  Recent Labs  07/03/12 0509  PROBNP 1534.0*   CBG: No results found for this basename: GLUCAP,  in the last 168 hours     Signed:  Dozier Berkovich  [facility] 07/13/2012, 8:13 PM  Attending note:  Patient seen and examined on the day of discharge.  Agree with note as above.

## 2012-07-14 DIAGNOSIS — L03119 Cellulitis of unspecified part of limb: Secondary | ICD-10-CM | POA: Diagnosis not present

## 2012-07-14 DIAGNOSIS — E119 Type 2 diabetes mellitus without complications: Secondary | ICD-10-CM | POA: Diagnosis not present

## 2012-07-14 DIAGNOSIS — L89309 Pressure ulcer of unspecified buttock, unspecified stage: Secondary | ICD-10-CM | POA: Diagnosis not present

## 2012-07-14 DIAGNOSIS — N390 Urinary tract infection, site not specified: Secondary | ICD-10-CM | POA: Diagnosis not present

## 2012-07-14 DIAGNOSIS — N184 Chronic kidney disease, stage 4 (severe): Secondary | ICD-10-CM | POA: Diagnosis not present

## 2012-07-14 DIAGNOSIS — R112 Nausea with vomiting, unspecified: Secondary | ICD-10-CM | POA: Diagnosis not present

## 2012-07-14 DIAGNOSIS — A499 Bacterial infection, unspecified: Secondary | ICD-10-CM | POA: Diagnosis not present

## 2012-07-14 DIAGNOSIS — I2510 Atherosclerotic heart disease of native coronary artery without angina pectoris: Secondary | ICD-10-CM | POA: Diagnosis not present

## 2012-07-14 DIAGNOSIS — L02419 Cutaneous abscess of limb, unspecified: Secondary | ICD-10-CM | POA: Diagnosis not present

## 2012-07-15 DIAGNOSIS — L02419 Cutaneous abscess of limb, unspecified: Secondary | ICD-10-CM | POA: Diagnosis not present

## 2012-07-15 DIAGNOSIS — N390 Urinary tract infection, site not specified: Secondary | ICD-10-CM | POA: Diagnosis not present

## 2012-07-16 DIAGNOSIS — I2510 Atherosclerotic heart disease of native coronary artery without angina pectoris: Secondary | ICD-10-CM | POA: Diagnosis not present

## 2012-07-16 DIAGNOSIS — L02419 Cutaneous abscess of limb, unspecified: Secondary | ICD-10-CM | POA: Diagnosis not present

## 2012-07-16 DIAGNOSIS — L89309 Pressure ulcer of unspecified buttock, unspecified stage: Secondary | ICD-10-CM | POA: Diagnosis not present

## 2012-07-16 DIAGNOSIS — E119 Type 2 diabetes mellitus without complications: Secondary | ICD-10-CM | POA: Diagnosis not present

## 2012-07-16 DIAGNOSIS — A499 Bacterial infection, unspecified: Secondary | ICD-10-CM | POA: Diagnosis not present

## 2012-07-16 DIAGNOSIS — N390 Urinary tract infection, site not specified: Secondary | ICD-10-CM | POA: Diagnosis not present

## 2012-07-17 DIAGNOSIS — L02419 Cutaneous abscess of limb, unspecified: Secondary | ICD-10-CM | POA: Diagnosis not present

## 2012-07-17 DIAGNOSIS — L89309 Pressure ulcer of unspecified buttock, unspecified stage: Secondary | ICD-10-CM | POA: Diagnosis not present

## 2012-07-17 DIAGNOSIS — E119 Type 2 diabetes mellitus without complications: Secondary | ICD-10-CM | POA: Diagnosis not present

## 2012-07-17 DIAGNOSIS — N390 Urinary tract infection, site not specified: Secondary | ICD-10-CM | POA: Diagnosis not present

## 2012-07-17 DIAGNOSIS — A499 Bacterial infection, unspecified: Secondary | ICD-10-CM | POA: Diagnosis not present

## 2012-07-17 DIAGNOSIS — I2510 Atherosclerotic heart disease of native coronary artery without angina pectoris: Secondary | ICD-10-CM | POA: Diagnosis not present

## 2012-07-20 DIAGNOSIS — E119 Type 2 diabetes mellitus without complications: Secondary | ICD-10-CM | POA: Diagnosis not present

## 2012-07-20 DIAGNOSIS — A499 Bacterial infection, unspecified: Secondary | ICD-10-CM | POA: Diagnosis not present

## 2012-07-20 DIAGNOSIS — N390 Urinary tract infection, site not specified: Secondary | ICD-10-CM | POA: Diagnosis not present

## 2012-07-20 DIAGNOSIS — I2510 Atherosclerotic heart disease of native coronary artery without angina pectoris: Secondary | ICD-10-CM | POA: Diagnosis not present

## 2012-07-20 DIAGNOSIS — L02419 Cutaneous abscess of limb, unspecified: Secondary | ICD-10-CM | POA: Diagnosis not present

## 2012-07-20 NOTE — Progress Notes (Signed)
PT Note - addendum  Late entry for G-codes   02-Jul-2012 0800  PT G-Codes **NOT FOR INPATIENT CLASS**  Functional Assessment Tool Used clincal judgement  Functional Limitation Mobility: Walking and moving around  Mobility: Walking and Moving Around Current Status (J1914) CL  Mobility: Walking and Moving Around Goal Status (N8295) CI  PT General Charges  $$ ACUTE PT VISIT 1 Procedure  PT Evaluation  $Initial PT Evaluation Tier I 1 Procedure  PT Treatments  $Therapeutic Activity 38-52 mins  07/20/2012 [place], [place] [phone]

## 2012-07-21 ENCOUNTER — Ambulatory Visit (INDEPENDENT_AMBULATORY_CARE_PROVIDER_SITE_OTHER): Payer: Medicare Other | Admitting: Cardiology

## 2012-07-21 ENCOUNTER — Encounter: Payer: Self-pay | Admitting: Cardiology

## 2012-07-21 VITALS — BP 70/40 | HR 58 | Wt 302.0 lb

## 2012-07-21 DIAGNOSIS — R627 Adult failure to thrive: Secondary | ICD-10-CM | POA: Insufficient documentation

## 2012-07-21 DIAGNOSIS — I5032 Chronic diastolic (congestive) heart failure: Secondary | ICD-10-CM | POA: Diagnosis not present

## 2012-07-21 DIAGNOSIS — E2749 Other adrenocortical insufficiency: Secondary | ICD-10-CM | POA: Diagnosis not present

## 2012-07-21 DIAGNOSIS — Z7901 Long term (current) use of anticoagulants: Secondary | ICD-10-CM | POA: Diagnosis not present

## 2012-07-21 DIAGNOSIS — E43 Unspecified severe protein-calorie malnutrition: Secondary | ICD-10-CM | POA: Insufficient documentation

## 2012-07-21 DIAGNOSIS — T148XXA Other injury of unspecified body region, initial encounter: Secondary | ICD-10-CM | POA: Diagnosis not present

## 2012-07-21 NOTE — Assessment & Plan Note (Signed)
I do not think that she is long for this world. Did not have time to do end of  life counseling. I would not consider her a ventilator candidate. I do not think she would be someone that could be weaned.

## 2012-07-21 NOTE — Patient Instructions (Addendum)
Your physician recommends that you schedule a follow-up appointment in: 6 months  Your physician has recommended you make the following change in your medication:  1 - STOP  Verapamil

## 2012-07-21 NOTE — Assessment & Plan Note (Signed)
Continue boost protein supplements
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
## 2012-07-21 NOTE — Assessment & Plan Note (Signed)
Continue with anticoagulation. With her blood pressure being so low I am going to discontinue her verapamil. Her exam indicates that she's probably in sinus rhythm today. I worry about falls not to mention for general perfusion.

## 2012-07-21 NOTE — Assessment & Plan Note (Signed)
Relatively stable. 

## 2012-07-22 DIAGNOSIS — A499 Bacterial infection, unspecified: Secondary | ICD-10-CM | POA: Diagnosis not present

## 2012-07-22 DIAGNOSIS — I2510 Atherosclerotic heart disease of native coronary artery without angina pectoris: Secondary | ICD-10-CM | POA: Diagnosis not present

## 2012-07-22 DIAGNOSIS — E119 Type 2 diabetes mellitus without complications: Secondary | ICD-10-CM | POA: Diagnosis not present

## 2012-07-22 DIAGNOSIS — N390 Urinary tract infection, site not specified: Secondary | ICD-10-CM | POA: Diagnosis not present

## 2012-07-22 DIAGNOSIS — L89309 Pressure ulcer of unspecified buttock, unspecified stage: Secondary | ICD-10-CM | POA: Diagnosis not present

## 2012-07-22 DIAGNOSIS — L02419 Cutaneous abscess of limb, unspecified: Secondary | ICD-10-CM | POA: Diagnosis not present

## 2012-07-23 DIAGNOSIS — E119 Type 2 diabetes mellitus without complications: Secondary | ICD-10-CM | POA: Diagnosis not present

## 2012-07-23 DIAGNOSIS — I259 Chronic ischemic heart disease, unspecified: Secondary | ICD-10-CM | POA: Diagnosis not present

## 2012-07-23 DIAGNOSIS — E785 Hyperlipidemia, unspecified: Secondary | ICD-10-CM | POA: Diagnosis not present

## 2012-07-23 DIAGNOSIS — J449 Chronic obstructive pulmonary disease, unspecified: Secondary | ICD-10-CM | POA: Diagnosis not present

## 2012-07-23 DIAGNOSIS — I10 Essential (primary) hypertension: Secondary | ICD-10-CM | POA: Diagnosis not present

## 2012-07-24 DIAGNOSIS — L02419 Cutaneous abscess of limb, unspecified: Secondary | ICD-10-CM | POA: Diagnosis not present

## 2012-07-24 DIAGNOSIS — L03119 Cellulitis of unspecified part of limb: Secondary | ICD-10-CM | POA: Diagnosis not present

## 2012-07-25 DIAGNOSIS — K5792 Diverticulitis of intestine, part unspecified, without perforation or abscess without bleeding: Secondary | ICD-10-CM

## 2012-07-25 HISTORY — DX: Diverticulitis of intestine, part unspecified, without perforation or abscess without bleeding: K57.92

## 2012-07-27 DIAGNOSIS — N390 Urinary tract infection, site not specified: Secondary | ICD-10-CM | POA: Diagnosis not present

## 2012-07-27 DIAGNOSIS — L03119 Cellulitis of unspecified part of limb: Secondary | ICD-10-CM | POA: Diagnosis not present

## 2012-07-27 DIAGNOSIS — A499 Bacterial infection, unspecified: Secondary | ICD-10-CM | POA: Diagnosis not present

## 2012-07-27 DIAGNOSIS — E119 Type 2 diabetes mellitus without complications: Secondary | ICD-10-CM | POA: Diagnosis not present

## 2012-07-27 DIAGNOSIS — I2510 Atherosclerotic heart disease of native coronary artery without angina pectoris: Secondary | ICD-10-CM | POA: Diagnosis not present

## 2012-07-27 DIAGNOSIS — L89309 Pressure ulcer of unspecified buttock, unspecified stage: Secondary | ICD-10-CM | POA: Diagnosis not present

## 2012-07-28 DIAGNOSIS — A499 Bacterial infection, unspecified: Secondary | ICD-10-CM | POA: Diagnosis not present

## 2012-07-28 DIAGNOSIS — R490 Dysphonia: Secondary | ICD-10-CM | POA: Diagnosis not present

## 2012-07-28 DIAGNOSIS — L03119 Cellulitis of unspecified part of limb: Secondary | ICD-10-CM | POA: Diagnosis not present

## 2012-07-28 DIAGNOSIS — L89309 Pressure ulcer of unspecified buttock, unspecified stage: Secondary | ICD-10-CM | POA: Diagnosis not present

## 2012-07-28 DIAGNOSIS — E119 Type 2 diabetes mellitus without complications: Secondary | ICD-10-CM | POA: Diagnosis not present

## 2012-07-28 DIAGNOSIS — J370 Chronic laryngitis: Secondary | ICD-10-CM | POA: Diagnosis not present

## 2012-07-28 DIAGNOSIS — I2510 Atherosclerotic heart disease of native coronary artery without angina pectoris: Secondary | ICD-10-CM | POA: Diagnosis not present

## 2012-07-28 DIAGNOSIS — N390 Urinary tract infection, site not specified: Secondary | ICD-10-CM | POA: Diagnosis not present

## 2012-07-29 DIAGNOSIS — L02419 Cutaneous abscess of limb, unspecified: Secondary | ICD-10-CM | POA: Diagnosis not present

## 2012-07-29 DIAGNOSIS — E559 Vitamin D deficiency, unspecified: Secondary | ICD-10-CM | POA: Diagnosis not present

## 2012-07-29 DIAGNOSIS — E1129 Type 2 diabetes mellitus with other diabetic kidney complication: Secondary | ICD-10-CM | POA: Diagnosis not present

## 2012-07-29 DIAGNOSIS — L89309 Pressure ulcer of unspecified buttock, unspecified stage: Secondary | ICD-10-CM | POA: Diagnosis not present

## 2012-07-29 DIAGNOSIS — A499 Bacterial infection, unspecified: Secondary | ICD-10-CM | POA: Diagnosis not present

## 2012-07-29 DIAGNOSIS — E2749 Other adrenocortical insufficiency: Secondary | ICD-10-CM | POA: Diagnosis not present

## 2012-07-29 DIAGNOSIS — E669 Obesity, unspecified: Secondary | ICD-10-CM | POA: Diagnosis not present

## 2012-07-30 ENCOUNTER — Emergency Department (HOSPITAL_COMMUNITY): Payer: Medicare Other

## 2012-07-30 ENCOUNTER — Encounter (HOSPITAL_COMMUNITY): Payer: Self-pay | Admitting: Emergency Medicine

## 2012-07-30 ENCOUNTER — Inpatient Hospital Stay (HOSPITAL_COMMUNITY)
Admission: EM | Admit: 2012-07-30 | Discharge: 2012-08-12 | DRG: 871 | Disposition: A | Discharge status: Skilled Nursing Facility | Payer: Medicare Other | Attending: Pulmonary Disease | Admitting: Pulmonary Disease

## 2012-07-30 DIAGNOSIS — R079 Chest pain, unspecified: Secondary | ICD-10-CM | POA: Diagnosis not present

## 2012-07-30 DIAGNOSIS — A419 Sepsis, unspecified organism: Secondary | ICD-10-CM | POA: Diagnosis not present

## 2012-07-30 DIAGNOSIS — I5033 Acute on chronic diastolic (congestive) heart failure: Secondary | ICD-10-CM | POA: Diagnosis not present

## 2012-07-30 DIAGNOSIS — D649 Anemia, unspecified: Secondary | ICD-10-CM | POA: Diagnosis not present

## 2012-07-30 DIAGNOSIS — I5032 Chronic diastolic (congestive) heart failure: Secondary | ICD-10-CM | POA: Diagnosis present

## 2012-07-30 DIAGNOSIS — N179 Acute kidney failure, unspecified: Secondary | ICD-10-CM | POA: Diagnosis not present

## 2012-07-30 DIAGNOSIS — Z981 Arthrodesis status: Secondary | ICD-10-CM

## 2012-07-30 DIAGNOSIS — B3781 Candidal esophagitis: Secondary | ICD-10-CM | POA: Diagnosis present

## 2012-07-30 DIAGNOSIS — I959 Hypotension, unspecified: Secondary | ICD-10-CM | POA: Diagnosis not present

## 2012-07-30 DIAGNOSIS — E1142 Type 2 diabetes mellitus with diabetic polyneuropathy: Secondary | ICD-10-CM | POA: Diagnosis present

## 2012-07-30 DIAGNOSIS — K219 Gastro-esophageal reflux disease without esophagitis: Secondary | ICD-10-CM | POA: Diagnosis present

## 2012-07-30 DIAGNOSIS — E1149 Type 2 diabetes mellitus with other diabetic neurological complication: Secondary | ICD-10-CM | POA: Diagnosis present

## 2012-07-30 DIAGNOSIS — E1165 Type 2 diabetes mellitus with hyperglycemia: Secondary | ICD-10-CM | POA: Diagnosis present

## 2012-07-30 DIAGNOSIS — L02419 Cutaneous abscess of limb, unspecified: Secondary | ICD-10-CM | POA: Diagnosis present

## 2012-07-30 DIAGNOSIS — J4489 Other specified chronic obstructive pulmonary disease: Secondary | ICD-10-CM | POA: Diagnosis present

## 2012-07-30 DIAGNOSIS — E782 Mixed hyperlipidemia: Secondary | ICD-10-CM | POA: Diagnosis present

## 2012-07-30 DIAGNOSIS — Z79899 Other long term (current) drug therapy: Secondary | ICD-10-CM

## 2012-07-30 DIAGNOSIS — Z794 Long term (current) use of insulin: Secondary | ICD-10-CM

## 2012-07-30 DIAGNOSIS — R4182 Altered mental status, unspecified: Secondary | ICD-10-CM | POA: Diagnosis not present

## 2012-07-30 DIAGNOSIS — N2581 Secondary hyperparathyroidism of renal origin: Secondary | ICD-10-CM | POA: Diagnosis present

## 2012-07-30 DIAGNOSIS — G9340 Encephalopathy, unspecified: Secondary | ICD-10-CM | POA: Diagnosis not present

## 2012-07-30 DIAGNOSIS — I10 Essential (primary) hypertension: Secondary | ICD-10-CM | POA: Diagnosis present

## 2012-07-30 DIAGNOSIS — Z7901 Long term (current) use of anticoagulants: Secondary | ICD-10-CM

## 2012-07-30 DIAGNOSIS — Z6841 Body Mass Index (BMI) 40.0 and over, adult: Secondary | ICD-10-CM

## 2012-07-30 DIAGNOSIS — N183 Chronic kidney disease, stage 3 unspecified: Secondary | ICD-10-CM | POA: Diagnosis present

## 2012-07-30 DIAGNOSIS — E860 Dehydration: Secondary | ICD-10-CM

## 2012-07-30 DIAGNOSIS — L89309 Pressure ulcer of unspecified buttock, unspecified stage: Secondary | ICD-10-CM | POA: Diagnosis not present

## 2012-07-30 DIAGNOSIS — Z452 Encounter for adjustment and management of vascular access device: Secondary | ICD-10-CM | POA: Diagnosis not present

## 2012-07-30 DIAGNOSIS — K5732 Diverticulitis of large intestine without perforation or abscess without bleeding: Secondary | ICD-10-CM | POA: Diagnosis not present

## 2012-07-30 DIAGNOSIS — N170 Acute kidney failure with tubular necrosis: Secondary | ICD-10-CM | POA: Diagnosis present

## 2012-07-30 DIAGNOSIS — E1169 Type 2 diabetes mellitus with other specified complication: Secondary | ICD-10-CM | POA: Diagnosis present

## 2012-07-30 DIAGNOSIS — I4891 Unspecified atrial fibrillation: Secondary | ICD-10-CM

## 2012-07-30 DIAGNOSIS — F411 Generalized anxiety disorder: Secondary | ICD-10-CM | POA: Diagnosis present

## 2012-07-30 DIAGNOSIS — E785 Hyperlipidemia, unspecified: Secondary | ICD-10-CM | POA: Diagnosis present

## 2012-07-30 DIAGNOSIS — Z87891 Personal history of nicotine dependence: Secondary | ICD-10-CM

## 2012-07-30 DIAGNOSIS — R5381 Other malaise: Secondary | ICD-10-CM | POA: Diagnosis not present

## 2012-07-30 DIAGNOSIS — IMO0001 Reserved for inherently not codable concepts without codable children: Secondary | ICD-10-CM | POA: Diagnosis not present

## 2012-07-30 DIAGNOSIS — A499 Bacterial infection, unspecified: Secondary | ICD-10-CM | POA: Diagnosis not present

## 2012-07-30 DIAGNOSIS — R601 Generalized edema: Secondary | ICD-10-CM | POA: Diagnosis present

## 2012-07-30 DIAGNOSIS — N189 Chronic kidney disease, unspecified: Secondary | ICD-10-CM | POA: Diagnosis not present

## 2012-07-30 DIAGNOSIS — E1129 Type 2 diabetes mellitus with other diabetic kidney complication: Secondary | ICD-10-CM | POA: Diagnosis not present

## 2012-07-30 DIAGNOSIS — M129 Arthropathy, unspecified: Secondary | ICD-10-CM | POA: Diagnosis present

## 2012-07-30 DIAGNOSIS — R6520 Severe sepsis without septic shock: Secondary | ICD-10-CM

## 2012-07-30 DIAGNOSIS — I129 Hypertensive chronic kidney disease with stage 1 through stage 4 chronic kidney disease, or unspecified chronic kidney disease: Secondary | ICD-10-CM | POA: Diagnosis not present

## 2012-07-30 DIAGNOSIS — R0989 Other specified symptoms and signs involving the circulatory and respiratory systems: Secondary | ICD-10-CM | POA: Diagnosis not present

## 2012-07-30 DIAGNOSIS — I2510 Atherosclerotic heart disease of native coronary artery without angina pectoris: Secondary | ICD-10-CM | POA: Diagnosis present

## 2012-07-30 DIAGNOSIS — N390 Urinary tract infection, site not specified: Secondary | ICD-10-CM | POA: Diagnosis present

## 2012-07-30 DIAGNOSIS — L03116 Cellulitis of left lower limb: Secondary | ICD-10-CM | POA: Diagnosis present

## 2012-07-30 DIAGNOSIS — L03119 Cellulitis of unspecified part of limb: Secondary | ICD-10-CM | POA: Diagnosis present

## 2012-07-30 DIAGNOSIS — Z7982 Long term (current) use of aspirin: Secondary | ICD-10-CM

## 2012-07-30 DIAGNOSIS — J449 Chronic obstructive pulmonary disease, unspecified: Secondary | ICD-10-CM | POA: Diagnosis not present

## 2012-07-30 DIAGNOSIS — R0602 Shortness of breath: Secondary | ICD-10-CM | POA: Diagnosis not present

## 2012-07-30 DIAGNOSIS — E039 Hypothyroidism, unspecified: Secondary | ICD-10-CM | POA: Diagnosis present

## 2012-07-30 DIAGNOSIS — E2749 Other adrenocortical insufficiency: Secondary | ICD-10-CM | POA: Diagnosis not present

## 2012-07-30 DIAGNOSIS — Z7401 Bed confinement status: Secondary | ICD-10-CM

## 2012-07-30 DIAGNOSIS — Z66 Do not resuscitate: Secondary | ICD-10-CM | POA: Diagnosis present

## 2012-07-30 DIAGNOSIS — M6281 Muscle weakness (generalized): Secondary | ICD-10-CM | POA: Diagnosis not present

## 2012-07-30 DIAGNOSIS — I509 Heart failure, unspecified: Secondary | ICD-10-CM | POA: Diagnosis present

## 2012-07-30 DIAGNOSIS — I482 Chronic atrial fibrillation, unspecified: Secondary | ICD-10-CM | POA: Diagnosis present

## 2012-07-30 DIAGNOSIS — E2740 Unspecified adrenocortical insufficiency: Secondary | ICD-10-CM | POA: Diagnosis present

## 2012-07-30 DIAGNOSIS — R5383 Other fatigue: Secondary | ICD-10-CM | POA: Diagnosis not present

## 2012-07-30 DIAGNOSIS — R609 Edema, unspecified: Secondary | ICD-10-CM | POA: Diagnosis not present

## 2012-07-30 DIAGNOSIS — Z9861 Coronary angioplasty status: Secondary | ICD-10-CM

## 2012-07-30 DIAGNOSIS — Z86718 Personal history of other venous thrombosis and embolism: Secondary | ICD-10-CM

## 2012-07-30 DIAGNOSIS — R600 Localized edema: Secondary | ICD-10-CM

## 2012-07-30 DIAGNOSIS — F29 Unspecified psychosis not due to a substance or known physiological condition: Secondary | ICD-10-CM | POA: Diagnosis not present

## 2012-07-30 DIAGNOSIS — R6521 Severe sepsis with septic shock: Secondary | ICD-10-CM

## 2012-07-30 DIAGNOSIS — E119 Type 2 diabetes mellitus without complications: Secondary | ICD-10-CM | POA: Diagnosis not present

## 2012-07-30 LAB — GLUCOSE, CAPILLARY
Glucose-Capillary: 117 mg/dL — ABNORMAL HIGH (ref 70–99)
Glucose-Capillary: 145 mg/dL — ABNORMAL HIGH (ref 70–99)

## 2012-07-30 LAB — URINALYSIS, MICROSCOPIC ONLY
Ketones, ur: NEGATIVE mg/dL
Nitrite: NEGATIVE
Urobilinogen, UA: 0.2 mg/dL (ref 0.0–1.0)
pH: 5 (ref 5.0–8.0)

## 2012-07-30 LAB — CBC WITH DIFFERENTIAL/PLATELET
Eosinophils Relative: 5 % (ref 0–5)
Monocytes Relative: 11 % (ref 3–12)
Neutrophils Relative %: 69 % (ref 43–77)
Platelets: 286 10*3/uL (ref 150–400)
RBC: 3.53 MIL/uL — ABNORMAL LOW (ref 3.87–5.11)
WBC: 11.2 10*3/uL — ABNORMAL HIGH (ref 4.0–10.5)

## 2012-07-30 LAB — PROTIME-INR: INR: 1.98 — ABNORMAL HIGH (ref 0.00–1.49)

## 2012-07-30 LAB — APTT: aPTT: 39 seconds — ABNORMAL HIGH (ref 24–37)

## 2012-07-30 LAB — LACTIC ACID, PLASMA: Lactic Acid, Venous: 3.9 mmol/L — ABNORMAL HIGH (ref 0.5–2.2)

## 2012-07-30 LAB — MRSA PCR SCREENING: MRSA by PCR: POSITIVE — AB

## 2012-07-30 LAB — BASIC METABOLIC PANEL
Chloride: 100 mEq/L (ref 96–112)
Creatinine, Ser: 3.58 mg/dL — ABNORMAL HIGH (ref 0.50–1.10)
GFR calc Af Amer: 14 mL/min — ABNORMAL LOW (ref >90)
Potassium: 4 mEq/L (ref 3.5–5.1)

## 2012-07-30 MED ORDER — SODIUM CHLORIDE 0.9 % IV BOLUS (SEPSIS)
500.0000 mL | Freq: Once | INTRAVENOUS | Status: AC
  Administered 2012-07-30: 500 mL via INTRAVENOUS

## 2012-07-30 MED ORDER — SODIUM CHLORIDE 0.9 % IV SOLN: INTRAVENOUS | Status: AC

## 2012-07-30 MED ORDER — SERTRALINE HCL 50 MG PO TABS
50.0000 mg | ORAL_TABLET | Freq: Every day | ORAL | Status: DC
  Administered 2012-07-31 – 2012-08-12 (×13): 50 mg via ORAL
  Filled 2012-07-30 (×13): qty 1

## 2012-07-30 MED ORDER — MUPIROCIN 2 % EX OINT
1.0000 "application " | TOPICAL_OINTMENT | Freq: Two times a day (BID) | CUTANEOUS | Status: AC
  Administered 2012-07-30 – 2012-08-04 (×10): 1 via NASAL
  Filled 2012-07-30 (×2): qty 22

## 2012-07-30 MED ORDER — MORPHINE SULFATE 2 MG/ML IJ SOLN
1.0000 mg | INTRAMUSCULAR | Status: DC | PRN
  Administered 2012-08-02 – 2012-08-11 (×11): 1 mg via INTRAVENOUS
  Filled 2012-07-30 (×11): qty 1

## 2012-07-30 MED ORDER — PREDNISONE 10 MG PO TABS
5.0000 mg | ORAL_TABLET | Freq: Every day | ORAL | Status: DC
  Administered 2012-07-30 – 2012-08-12 (×14): 5 mg via ORAL
  Filled 2012-07-30 (×5): qty 1
  Filled 2012-07-30: qty 2
  Filled 2012-07-30 (×8): qty 1

## 2012-07-30 MED ORDER — ONDANSETRON HCL 4 MG/2ML IJ SOLN
4.0000 mg | Freq: Four times a day (QID) | INTRAMUSCULAR | Status: DC | PRN
  Administered 2012-08-05 – 2012-08-11 (×7): 4 mg via INTRAVENOUS
  Filled 2012-07-30 (×8): qty 2

## 2012-07-30 MED ORDER — GABAPENTIN 100 MG PO CAPS
100.0000 mg | ORAL_CAPSULE | Freq: Three times a day (TID) | ORAL | Status: DC
  Administered 2012-07-30 – 2012-08-12 (×38): 100 mg via ORAL
  Filled 2012-07-30 (×39): qty 1

## 2012-07-30 MED ORDER — POLYETHYLENE GLYCOL 3350 17 G PO PACK
17.0000 g | PACK | Freq: Every day | ORAL | Status: DC
  Administered 2012-07-31 – 2012-08-11 (×10): 17 g via ORAL
  Filled 2012-07-30 (×10): qty 1

## 2012-07-30 MED ORDER — SODIUM CHLORIDE 0.9 % IV SOLN
INTRAVENOUS | Status: DC
  Administered 2012-07-30: via INTRAVENOUS

## 2012-07-30 MED ORDER — ATORVASTATIN CALCIUM 40 MG PO TABS
80.0000 mg | ORAL_TABLET | Freq: Every day | ORAL | Status: DC
  Administered 2012-07-30 – 2012-08-11 (×13): 80 mg via ORAL
  Filled 2012-07-30 (×14): qty 2

## 2012-07-30 MED ORDER — ASPIRIN EC 81 MG PO TBEC
81.0000 mg | DELAYED_RELEASE_TABLET | Freq: Every day | ORAL | Status: DC
  Administered 2012-07-31 – 2012-08-12 (×13): 81 mg via ORAL
  Filled 2012-07-30 (×13): qty 1

## 2012-07-30 MED ORDER — CARBAMIDE PEROXIDE 6.5 % OT SOLN
5.0000 [drp] | Freq: Two times a day (BID) | OTIC | Status: DC
  Administered 2012-07-30 – 2012-08-12 (×24): 5 [drp] via OTIC
  Filled 2012-07-30 (×5): qty 15

## 2012-07-30 MED ORDER — VITAMIN B-12 1000 MCG PO TABS
1000.0000 ug | ORAL_TABLET | Freq: Every day | ORAL | Status: DC
  Administered 2012-07-31 – 2012-08-12 (×13): 1000 ug via ORAL
  Filled 2012-07-30 (×14): qty 1

## 2012-07-30 MED ORDER — LEVALBUTEROL HCL 0.63 MG/3ML IN NEBU
0.6300 mg | INHALATION_SOLUTION | Freq: Four times a day (QID) | RESPIRATORY_TRACT | Status: DC
  Administered 2012-07-30 – 2012-08-12 (×40): 0.63 mg via RESPIRATORY_TRACT
  Filled 2012-07-30 (×42): qty 3

## 2012-07-30 MED ORDER — CHLORHEXIDINE GLUCONATE CLOTH 2 % EX PADS
6.0000 | MEDICATED_PAD | Freq: Every day | CUTANEOUS | Status: AC
  Administered 2012-08-01 – 2012-08-03 (×3): 6 via TOPICAL

## 2012-07-30 MED ORDER — FLUTICASONE PROPIONATE 50 MCG/ACT NA SUSP
1.0000 | Freq: Every day | NASAL | Status: DC
  Administered 2012-07-31 – 2012-08-12 (×12): 1 via NASAL
  Filled 2012-07-30: qty 16

## 2012-07-30 MED ORDER — RIVAROXABAN 10 MG PO TABS
20.0000 mg | ORAL_TABLET | Freq: Every day | ORAL | Status: DC
  Administered 2012-07-30: 20 mg via ORAL
  Filled 2012-07-30: qty 2

## 2012-07-30 MED ORDER — LEVOTHYROXINE SODIUM 100 MCG PO TABS
200.0000 ug | ORAL_TABLET | Freq: Every day | ORAL | Status: DC
  Administered 2012-07-31 – 2012-08-12 (×13): 200 ug via ORAL
  Filled 2012-07-30 (×13): qty 2

## 2012-07-30 MED ORDER — ONDANSETRON HCL 4 MG PO TABS
4.0000 mg | ORAL_TABLET | Freq: Four times a day (QID) | ORAL | Status: DC | PRN
  Administered 2012-08-06 – 2012-08-08 (×3): 4 mg via ORAL
  Filled 2012-07-30 (×4): qty 1

## 2012-07-30 MED ORDER — DEXTROSE 5 % IV SOLN
INTRAVENOUS | Status: AC
  Filled 2012-07-30: qty 10

## 2012-07-30 MED ORDER — INSULIN DETEMIR 100 UNIT/ML ~~LOC~~ SOLN
10.0000 [IU] | Freq: Every day | SUBCUTANEOUS | Status: DC
  Administered 2012-07-30 – 2012-08-09 (×11): 10 [IU] via SUBCUTANEOUS
  Filled 2012-07-30: qty 10

## 2012-07-30 MED ORDER — INSULIN DETEMIR 100 UNIT/ML ~~LOC~~ SOLN
SUBCUTANEOUS | Status: AC
  Filled 2012-07-30: qty 10

## 2012-07-30 MED ORDER — DEXTROSE 5 % IV SOLN
1.0000 g | INTRAVENOUS | Status: DC
  Administered 2012-07-30 – 2012-07-31 (×2): 1 g via INTRAVENOUS
  Filled 2012-07-30 (×4): qty 10

## 2012-07-30 MED ORDER — FESOTERODINE FUMARATE ER 8 MG PO TB24
8.0000 mg | ORAL_TABLET | Freq: Every day | ORAL | Status: DC
  Administered 2012-07-31 – 2012-08-12 (×13): 8 mg via ORAL
  Filled 2012-07-30 (×14): qty 1

## 2012-07-30 MED ORDER — PANTOPRAZOLE SODIUM 40 MG PO TBEC
80.0000 mg | DELAYED_RELEASE_TABLET | Freq: Every day | ORAL | Status: DC
  Administered 2012-07-31 – 2012-08-11 (×12): 80 mg via ORAL
  Filled 2012-07-30 (×9): qty 2
  Filled 2012-07-30 (×2): qty 1
  Filled 2012-07-30 (×2): qty 2

## 2012-07-30 MED ORDER — CALCITRIOL 0.25 MCG PO CAPS
0.2500 ug | ORAL_CAPSULE | Freq: Every day | ORAL | Status: DC
  Administered 2012-07-31 – 2012-08-12 (×13): 0.25 ug via ORAL
  Filled 2012-07-30 (×13): qty 1

## 2012-07-30 MED ORDER — MOMETASONE FURO-FORMOTEROL FUM 100-5 MCG/ACT IN AERO
2.0000 | INHALATION_SPRAY | Freq: Two times a day (BID) | RESPIRATORY_TRACT | Status: DC
  Administered 2012-07-30 – 2012-08-12 (×23): 2 via RESPIRATORY_TRACT
  Filled 2012-07-30 (×2): qty 8.8

## 2012-07-30 MED ORDER — INSULIN ASPART 100 UNIT/ML ~~LOC~~ SOLN
0.0000 [IU] | Freq: Three times a day (TID) | SUBCUTANEOUS | Status: DC
  Administered 2012-07-31 – 2012-08-03 (×10): 2 [IU] via SUBCUTANEOUS
  Administered 2012-08-04 (×2): 3 [IU] via SUBCUTANEOUS
  Administered 2012-08-04: 1 [IU] via SUBCUTANEOUS
  Administered 2012-08-05: 2 [IU] via SUBCUTANEOUS
  Administered 2012-08-05: 13:00:00 via SUBCUTANEOUS
  Administered 2012-08-05: 7 [IU] via SUBCUTANEOUS
  Administered 2012-08-06: 2 [IU] via SUBCUTANEOUS
  Administered 2012-08-06: 5 [IU] via SUBCUTANEOUS
  Administered 2012-08-06: 3 [IU] via SUBCUTANEOUS
  Administered 2012-08-07: 2 [IU] via SUBCUTANEOUS
  Administered 2012-08-07: 3 [IU] via SUBCUTANEOUS
  Administered 2012-08-07: 7 [IU] via SUBCUTANEOUS
  Administered 2012-08-08 (×2): 3 [IU] via SUBCUTANEOUS
  Administered 2012-08-08: 2 [IU] via SUBCUTANEOUS
  Administered 2012-08-09 (×2): 3 [IU] via SUBCUTANEOUS
  Administered 2012-08-09: 1 [IU] via SUBCUTANEOUS
  Administered 2012-08-10: 5 [IU] via SUBCUTANEOUS
  Administered 2012-08-10: 2 [IU] via SUBCUTANEOUS
  Administered 2012-08-10: 3 [IU] via SUBCUTANEOUS
  Administered 2012-08-11: 2 [IU] via SUBCUTANEOUS
  Administered 2012-08-11: 3 [IU] via SUBCUTANEOUS
  Administered 2012-08-12: 1 [IU] via SUBCUTANEOUS

## 2012-07-30 MED ORDER — INSULIN ASPART 100 UNIT/ML ~~LOC~~ SOLN
0.0000 [IU] | Freq: Every day | SUBCUTANEOUS | Status: DC
  Administered 2012-08-01 – 2012-08-05 (×3): 2 [IU] via SUBCUTANEOUS
  Administered 2012-08-06: 4 [IU] via SUBCUTANEOUS
  Administered 2012-08-07: 3 [IU] via SUBCUTANEOUS
  Administered 2012-08-08 – 2012-08-09 (×2): 2 [IU] via SUBCUTANEOUS
  Administered 2012-08-10: 23:00:00 via SUBCUTANEOUS
  Administered 2012-08-11: 2 [IU] via SUBCUTANEOUS

## 2012-07-30 MED ORDER — SODIUM CHLORIDE 0.9 % IJ SOLN
3.0000 mL | Freq: Two times a day (BID) | INTRAMUSCULAR | Status: DC
  Administered 2012-07-30 – 2012-08-10 (×10): 3 mL via INTRAVENOUS

## 2012-07-30 MED ORDER — SODIUM CHLORIDE 0.9 % IV SOLN: INTRAVENOUS | Status: DC

## 2012-07-30 NOTE — ED Notes (Signed)
Pt c/o weakness, change in mentation, onset this am.
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
## 2012-07-30 NOTE — ED Notes (Signed)
Patient placed on bed pan to have bowel movement.

## 2012-07-30 NOTE — ED Notes (Signed)
CRITICAL VALUE ALERT  Critical value received:  Digoxin 2.9  Date of notification:  07/30/2012  Time of notification:  1628  Critical value read back:yes  Nurse who received alert:  c Tanisha Lutes rn  MD notified (1st page):  Dr Bernette Mayers  Time of first page:  1629  MD notified (2nd page):  Time of second page:  Responding MD:  Dr Bernette Mayers  Time MD responded:  [phone]
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
## 2012-07-30 NOTE — ED Provider Notes (Signed)
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 in my presence. The recorded information has been reviewed and is accurate.   Late entry: pt found to have acute on chronic renal failure. BP low initially, improved with IVF. All likely from UTI/dehydration. Consider sepsis, admit to hospitalist for further eval. Arms were undressed and no signs on cellulitis.    Ann B. Bernette Mayers, MD 08/11/12 2010

## 2012-07-31 DIAGNOSIS — R609 Edema, unspecified: Secondary | ICD-10-CM

## 2012-07-31 DIAGNOSIS — A419 Sepsis, unspecified organism: Secondary | ICD-10-CM | POA: Diagnosis not present

## 2012-07-31 DIAGNOSIS — N189 Chronic kidney disease, unspecified: Secondary | ICD-10-CM

## 2012-07-31 DIAGNOSIS — E2749 Other adrenocortical insufficiency: Secondary | ICD-10-CM | POA: Diagnosis not present

## 2012-07-31 DIAGNOSIS — I509 Heart failure, unspecified: Secondary | ICD-10-CM | POA: Diagnosis not present

## 2012-07-31 DIAGNOSIS — E1129 Type 2 diabetes mellitus with other diabetic kidney complication: Secondary | ICD-10-CM | POA: Diagnosis not present

## 2012-07-31 DIAGNOSIS — I4891 Unspecified atrial fibrillation: Secondary | ICD-10-CM | POA: Diagnosis not present

## 2012-07-31 DIAGNOSIS — G9340 Encephalopathy, unspecified: Secondary | ICD-10-CM

## 2012-07-31 LAB — GLUCOSE, CAPILLARY
Glucose-Capillary: 152 mg/dL — ABNORMAL HIGH (ref 70–99)
Glucose-Capillary: 153 mg/dL — ABNORMAL HIGH (ref 70–99)

## 2012-07-31 LAB — BASIC METABOLIC PANEL
BUN: 41 mg/dL — ABNORMAL HIGH (ref 6–23)
Calcium: 8.3 mg/dL — ABNORMAL LOW (ref 8.4–10.5)
GFR calc Af Amer: 14 mL/min — ABNORMAL LOW (ref >90)
GFR calc non Af Amer: 12 mL/min — ABNORMAL LOW (ref >90)
Potassium: 4.4 mEq/L (ref 3.5–5.1)
Sodium: 138 mEq/L (ref 135–145)

## 2012-07-31 LAB — HEMOGLOBIN A1C: Mean Plasma Glucose: 140 mg/dL — ABNORMAL HIGH (ref <117)

## 2012-07-31 LAB — URINE CULTURE: Colony Count: NO GROWTH

## 2012-07-31 LAB — CBC
MCH: 26.4 pg (ref 26.0–34.0)
MCHC: 29.9 g/dL — ABNORMAL LOW (ref 30.0–36.0)
RDW: 21.8 % — ABNORMAL HIGH (ref 11.5–15.5)

## 2012-07-31 MED ORDER — ENOXAPARIN SODIUM 150 MG/ML ~~LOC~~ SOLN
130.0000 mg | SUBCUTANEOUS | Status: DC
  Administered 2012-07-31 – 2012-08-01 (×2): 130 mg via SUBCUTANEOUS
  Filled 2012-07-31 (×3): qty 1

## 2012-07-31 MED ORDER — FUROSEMIDE 10 MG/ML IJ SOLN
40.0000 mg | Freq: Two times a day (BID) | INTRAMUSCULAR | Status: DC
  Administered 2012-07-31: 40 mg via INTRAVENOUS
  Filled 2012-07-31: qty 4

## 2012-07-31 MED ORDER — DEXTROSE 5 % IV SOLN
100.0000 mg | Freq: Two times a day (BID) | INTRAVENOUS | Status: DC
  Administered 2012-07-31 – 2012-08-01 (×3): 100 mg via INTRAVENOUS
  Filled 2012-07-31 (×7): qty 10

## 2012-07-31 MED ORDER — SODIUM CHLORIDE 0.9 % IV BOLUS (SEPSIS)
500.0000 mL | Freq: Once | INTRAVENOUS | Status: AC
  Administered 2012-07-31: 500 mL via INTRAVENOUS

## 2012-07-31 MED ORDER — SODIUM CHLORIDE 0.9 % IV SOLN
INTRAVENOUS | Status: DC
  Administered 2012-07-31 – 2012-08-02 (×4): via INTRAVENOUS

## 2012-07-31 NOTE — Care Management Note (Addendum)
    Page 1 of 2   08/12/2012     10:49:38 AM   CARE MANAGEMENT NOTE 08/12/2012  Patient:  Ann [name], Ann [name]   Account Number:  [id number]  Date Initiated:  07/31/2012  Documentation initiated by:  Sharrie Rothman  Subjective/Objective Assessment:   Pt admitted from home with UTI. Pt lives with her husband and son who provide total care to pt. Pt has a walker, wheelchair, neb for home use. Pt is active with [facility].     Action/Plan:   Will arrange resumption of HH at discharge.   Anticipated DC Date:  08/03/2012   Anticipated DC Plan:  HOME W HOME HEALTH SERVICES      DC Planning Services  CM consult      Choice offered to / List presented to:             [facility] agency  [facility]   Status of service:  Completed, signed off Medicare Important Message given?  YES (If response is "NO", the following Medicare IM given date fields will be blank) Date Medicare IM given:  08/07/2012 Date Additional Medicare IM given:  08/12/2012  Discharge Disposition:  SKILLED NURSING FACILITY  Per UR Regulation:    If discussed at Long Length of Stay Meetings, dates discussed:   08/04/2012  08/06/2012  08/11/2012    Comments:  08/12/12 1050 Arlyss Queen, RN BSN CM Pt discharged to [facility] today. CSW to arrange discharge to facility.  08/07/12 1320 Arlyss Queen, RN BSN CM Pt potential discharge 08/08/12 to [facility]. CSW will make arrangements for weekend discharge.  08/06/12 1203 Arlyss Queen, RN BSN CM Pt is now agreeing to SNF but only at [facility]. CSW is aware and will check with them to see if they will be able to offer at bed. Pt is very close to discharge. If pt returns home, resumption of [facility] and [facility] will be arranged.  08/04/12 1600 Arlyss Queen, RN BSN CM PT recommending SNF at discharge. Pt states that she is going home at discharge. Pt is active with [facility] and [facility]. Will continue to follow for discharge needs.  07/31/12 1200 Arlyss Queen, RN BSN CM

## 2012-07-31 NOTE — Progress Notes (Addendum)
Subjective: This lady was admitted with confusion, felt to be secondary to another UTI. However she is very edematous in her legs and arms. Her creatinine has deteriorated from her previous admission and even from yesterday. She is less confused and she was on admission.           Physical Exam: Blood pressure 110/54, pulse 76, temperature 98.2 F (36.8 C), temperature source Oral, resp. rate 20, height 5\' 4"  (1.626 m), weight 136.079 kg (300 lb), SpO2 94.00%. She looks chronically sick. She is cushingoid. She is peripheral pitting edema up to her thighs. She is morbidly obese. She cannot sit up for me to examine her lung fields. Her abdomen is soft and mildly tender in a generalized fashion. Heart sounds are present and appear to be in sinus rhythm at the present time. She is alert and orientated. There are no focal neurological signs.   Investigations:  Recent Results (from the past 240 hour(s))  MRSA PCR SCREENING     Status: Abnormal   Collection Time    07/30/12  6:00 PM      Result Value Range Status   MRSA by PCR POSITIVE (*) NEGATIVE Final   Comment:            The GeneXpert MRSA Assay (FDA     approved for NASAL specimens     only), is one component of a     comprehensive MRSA colonization     surveillance program. It is not     intended to diagnose MRSA     infection nor to guide or     monitor treatment for     MRSA infections.     CRITICAL RESULT CALLED TO, READ BACK BY AND VERIFIED WITH:     M. SIMPSON AT 2030 ON 07/30/12 BY Wynonia Lawman     Basic Metabolic Panel:  Recent Labs  16/10/96 1430 07/31/12 0513  NA 137 138  K 4.0 4.4  CL 100 105  CO2 23 22  GLUCOSE 110* 134*  BUN 41* 41*  CREATININE 3.58* 3.62*  CALCIUM 9.2 8.3*       CBC:  Recent Labs  07/30/12 1430 07/31/12 0513  WBC 11.2* 9.2  NEUTROABS 7.7  --   HGB 9.4* 8.9*  HCT 31.3* 29.8*  MCV 88.7 88.4  PLT 286 239    Dg Chest Port 1 View  07/30/2012  *RADIOLOGY REPORT*   Clinical Data: Shortness of breath, weakness and hypertension.  PORTABLE CHEST - 1 VIEW  Comparison: Chest x-ray 07/03/2012.  Findings: Film is severely underpenetrated related to the patient's large body habitus.  With this limitation in mind, there is no definite acute consolidative airspace disease, and no definite pleural effusion.  Minimal bibasilar opacities are most compatible with areas of subsegmental atelectasis and/or scarring, and/or similar to prior studies.  Prominence of the central pulmonary arteries suggestive of pulmonary arterial hypertension.  No evidence of pulmonary edema.  Heart size is mildly enlarged. Orthopedic fixation hardware in the lower cervical spine is incidentally noted.  IMPRESSION: 1.  No definite radiographic evidence of acute cardiopulmonary disease. 2.  Cardiomegaly and dilatation of the central pulmonary arteries are demonstrated, suggestive of pulmonary arterial hypertension.   Original Report Authenticated By: Trudie Reed, M.D.       Medications: I have reviewed the patient's current medications.  Impression: 1. Shock, possibly related to UTI, improving. 2. Acute on chronic diastolic congestive heart failure. 3. Acute on chronic kidney disease with  progressive renal failure. 4. Morbid obesity. 5. Atrial fibrillation, currently appears to be in sinus rhythm. 6. UTI. 7. Hypertension. 8. Hypothyroidism.     Plan: 1. Start intravenous Lasix 40 mg IV twice a day in view of her heart failure. We'll need to monitor her renal function very closely. 2. Nephrology consultation. 3. Continue with IV Rocephin for probable UTI. 4. Discontinue xeralto in view of her acute renal failure. His Lovenox full dose per pharmacy for the time being. 5. I did discuss with the patient regarding her overall health/pathophysiology of disease process and she understands that she is very sick and she also understands that she she may have a shortened lifespan. She is ready to  die if she should not improve. She is a DO NOT RESUSCITATE.     LOS: 1 day   Wilson Singer Pager [phone]  07/31/2012, 10:53 AM
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
## 2012-07-31 NOTE — Progress Notes (Signed)
Patient has been confused at times during the night. Edema has increased to +4 in RUE and LLE.
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
## 2012-07-31 NOTE — Progress Notes (Signed)
CRITICAL VALUE ALERT  Critical value received:  Dig 3.6  Date of notification:  10/31/2012  Time of notification:  0605  Critical value read back:yes  Nurse who received alert:  Jaidy Cottam  MD notified (1st page):  campbell  Time of first page:  0608  MD notified (2nd page):Campbell  Time of second ZOXW:9604  Responding MD: Orvan Falconer*  Time MD responded:  [phone]

## 2012-07-31 NOTE — Progress Notes (Signed)
UR Chart Review Completed  

## 2012-08-01 DIAGNOSIS — I5032 Chronic diastolic (congestive) heart failure: Secondary | ICD-10-CM

## 2012-08-01 DIAGNOSIS — A419 Sepsis, unspecified organism: Secondary | ICD-10-CM | POA: Diagnosis not present

## 2012-08-01 DIAGNOSIS — I509 Heart failure, unspecified: Secondary | ICD-10-CM | POA: Diagnosis not present

## 2012-08-01 LAB — GLUCOSE, CAPILLARY: Glucose-Capillary: 128 mg/dL — ABNORMAL HIGH (ref 70–99)

## 2012-08-01 LAB — CBC
HCT: 27.6 % — ABNORMAL LOW (ref 36.0–46.0)
Hemoglobin: 8.4 g/dL — ABNORMAL LOW (ref 12.0–15.0)
MCH: 27.1 pg (ref 26.0–34.0)
MCHC: 30.4 g/dL (ref 30.0–36.0)

## 2012-08-01 LAB — BASIC METABOLIC PANEL
BUN: 40 mg/dL — ABNORMAL HIGH (ref 6–23)
Chloride: 104 mEq/L (ref 96–112)
Glucose, Bld: 145 mg/dL — ABNORMAL HIGH (ref 70–99)
Potassium: 4 mEq/L (ref 3.5–5.1)

## 2012-08-01 LAB — PHOSPHORUS: Phosphorus: 4.2 mg/dL (ref 2.3–4.6)

## 2012-08-01 MED ORDER — SODIUM CHLORIDE 0.9 % IV SOLN
1020.0000 mg | Freq: Once | INTRAVENOUS | Status: AC
  Administered 2012-08-01: 1020 mg via INTRAVENOUS
  Filled 2012-08-01: qty 34

## 2012-08-01 NOTE — Progress Notes (Signed)
Subjective: This lady was admitted with confusion, felt to be secondary to another UTI. However she is very edematous in her legs and arms. Her urine culture actually was negative. I felt that she was ready and congestive heart failure and started her on Lasix. She was seen by nephrology, Dr Fausto Skillern, who agreed and has increased dose of Lasix given to her twice a day. She possibly feels a little better today.          Physical Exam: Blood pressure 109/66, pulse 80, temperature 98.1 F (36.7 C), temperature source Oral, resp. rate 20, height 5\' 4"  (1.626 m), weight 136.079 kg (300 lb), SpO2 98.00%. She looks chronically sick. She is cushingoid. She has peripheral pitting edema up to her thighs. She is morbidly obese. She cannot sit up for me to examine her lung fields. Her abdomen is soft and mildly tender in a generalized fashion. Heart sounds are present and appear to be in sinus rhythm at the present time. She is alert and orientated. There are no focal neurological signs.   Investigations:  Recent Results (from the past 240 hour(s))  URINE CULTURE     Status: None   Collection Time    07/30/12  3:33 PM      Result Value Range Status   Specimen Description URINE, CATHETERIZED   Final   Special Requests NONE   Final   Culture  Setup Time 07/31/2012 01:06   Final   Colony Count NO GROWTH   Final   Culture NO GROWTH   Final   Report Status 07/31/2012 FINAL   Final  MRSA PCR SCREENING     Status: Abnormal   Collection Time    07/30/12  6:00 PM      Result Value Range Status   MRSA by PCR POSITIVE (*) NEGATIVE Final   Comment:            The GeneXpert MRSA Assay (FDA     approved for NASAL specimens     only), is one component of a     comprehensive MRSA colonization     surveillance program. It is not     intended to diagnose MRSA     infection nor to guide or     monitor treatment for     MRSA infections.     CRITICAL RESULT CALLED TO, READ BACK BY AND VERIFIED  WITH:     M. SIMPSON AT 2030 ON 07/30/12 BY Wynonia Lawman     Basic Metabolic Panel:  Recent Labs  16/10/96 0513 08/01/12 0657  NA 138 137  K 4.4 4.0  CL 105 104  CO2 22 22  GLUCOSE 134* 145*  BUN 41* 40*  CREATININE 3.62* 2.46*  CALCIUM 8.3* 8.1*  PHOS  --  4.2       CBC:  Recent Labs  07/30/12 1430 07/31/12 0513 08/01/12 0657  WBC 11.2* 9.2 7.4  NEUTROABS 7.7  --   --   HGB 9.4* 8.9* 8.4*  HCT 31.3* 29.8* 27.6*  MCV 88.7 88.4 89.0  PLT 286 239 230    Dg Chest Port 1 View  07/30/2012  *RADIOLOGY REPORT*  Clinical Data: Shortness of breath, weakness and hypertension.  PORTABLE CHEST - 1 VIEW  Comparison: Chest x-ray 07/03/2012.  Findings: Film is severely underpenetrated related to the patient's large body habitus.  With this limitation in mind, there is no definite acute consolidative airspace disease, and no definite pleural effusion.  Minimal bibasilar opacities  are most compatible with areas of subsegmental atelectasis and/or scarring, and/or similar to prior studies.  Prominence of the central pulmonary arteries suggestive of pulmonary arterial hypertension.  No evidence of pulmonary edema.  Heart size is mildly enlarged. Orthopedic fixation hardware in the lower cervical spine is incidentally noted.  IMPRESSION: 1.  No definite radiographic evidence of acute cardiopulmonary disease. 2.  Cardiomegaly and dilatation of the central pulmonary arteries are demonstrated, suggestive of pulmonary arterial hypertension.   Original Report Authenticated By: Trudie Reed, M.D.       Medications: I have reviewed the patient's current medications.  Impression: 1. Acute on chronic diastolic congestive heart failure. 2. Acute on chronic kidney disease with progressive renal failure. 3. Morbid obesity. 4. Atrial fibrillation, currently appears to be in sinus rhythm. 5. Hypothyroidism.     Plan: 1. Agree with high dose Lasix. Continue to monitor weights and renal function  closely. 2. Discontinue antibiotic as she does not have a UTI.     LOS: 2 days   Wilson Singer Pager [phone]  08/01/2012, 8:08 AM

## 2012-08-01 NOTE — Progress Notes (Signed)
During night patient had low blood pressure with systolic in the 80s.  Notified Dr.Campbell.  Patient received 500 cc bolus and blood pressure increased to systolic in 100s.  Patient in stable condition, will continue to monitor patient.

## 2012-08-01 NOTE — Progress Notes (Addendum)
Ann [name]  MRN: [id number]  DOB/AGE: [birth year] 67 y.o.  Primary Care Physician:HALL,ZACK, MD  Admit date: 07/30/2012  Chief Complaint:  Chief Complaint  Patient presents with  . Weakness    S-Pt presented on  07/30/2012 with  Chief Complaint  Patient presents with  . Weakness  .    Pt today feels better   Meds . aspirin EC  81 mg Oral Daily  . atorvastatin  80 mg Oral q1800  . calcitRIOL  0.25 mcg Oral Daily  . carbamide peroxide  5 drop Both Ears BID  . Chlorhexidine Gluconate Cloth  6 each Topical Q0600  . enoxaparin (LOVENOX) injection  130 mg Subcutaneous Q24H  . fesoterodine  8 mg Oral Daily  . fluticasone  1 spray Each Nare Daily  . furosemide  100 mg Intravenous BID  . gabapentin  100 mg Oral TID  . insulin aspart  0-5 Units Subcutaneous QHS  . insulin aspart  0-9 Units Subcutaneous TID WC  . insulin detemir  10 Units Subcutaneous QHS  . levalbuterol  0.63 mg Nebulization Q6H  . levothyroxine  200 mcg Oral Daily  . mometasone-formoterol  2 puff Inhalation BID  . mupirocin ointment  1 application Nasal BID  . pantoprazole  80 mg Oral Q1200  . polyethylene glycol  17 g Oral Daily  . predniSONE  5 mg Oral Daily  . sertraline  50 mg Oral Daily  . sodium chloride  3 mL Intravenous Q12H  . vitamin B-12  1,000 mcg Oral Daily      Physical Exam: Vital signs in last 24 hours: Temp:  [97.4 F (36.3 C)-98.3 F (36.8 C)] 98.1 F (36.7 C) (03/08 0419) Pulse Rate:  [78-86] 80 (03/08 0419) Resp:  [20] 20 (03/08 0419) BP: (89-115)/(39-66) 109/66 mmHg (03/08 0419) SpO2:  [91 %-98 %] 98 % (03/08 0725) Weight change:  Last BM Date: 07/29/12  Intake/Output from previous day: 03/07 0701 - 03/08 0700 In: 2669 [P.O.:660; I.V.:2009] Out: 1350 [Urine:1350]     Physical Exam: General- pt is awake,alert, follows comands Resp- No acute REsp distress, Decreased BS at bases. CVS- S1S2 regular in rate and rhythm-distant GIT- BS+, soft, NT, ND, Morbidly obese EXT-  2+ LE Edema, NO Cyanosis   Lab Results: CBC  Recent Labs  07/31/12 0513 08/01/12 0657  WBC 9.2 7.4  HGB 8.9* 8.4*  HCT 29.8* 27.6*  PLT 239 230    BMET  Recent Labs  07/31/12 0513 08/01/12 0657  NA 138 137  K 4.4 4.0  CL 105 104  CO2 22 22  GLUCOSE 134* 145*  BUN 41* 40*  CREATININE 3.62* 2.46*  CALCIUM 8.3* 8.1*   Creat Trend 2014 3.62==>2.46          2.4==>1.04 ( Last admission) 2013 0.8--1.0 2012 0.9--1.13    MICRO Recent Results (from the past 240 hour(s))  URINE CULTURE     Status: None   Collection Time    07/30/12  3:33 PM      Result Value Range Status   Specimen Description URINE, CATHETERIZED   Final   Special Requests NONE   Final   Culture  Setup Time 07/31/2012 01:06   Final   Colony Count NO GROWTH   Final   Culture NO GROWTH   Final   Report Status 07/31/2012 FINAL   Final  MRSA PCR SCREENING     Status: Abnormal   Collection Time    07/30/12  6:00 PM  Result Value Range Status   MRSA by PCR POSITIVE (*) NEGATIVE Final   Comment:            The GeneXpert MRSA Assay (FDA     approved for NASAL specimens     only), is one component of a     comprehensive MRSA colonization     surveillance program. It is not     intended to diagnose MRSA     infection nor to guide or     monitor treatment for     MRSA infections.     CRITICAL RESULT CALLED TO, READ BACK BY AND VERIFIED WITH:     M. SIMPSON AT 2030 ON 07/30/12 BY Wynonia Lawman      Lab Results  Component Value Date   PTH 177.0* 06/28/2012   CALCIUM 8.1* 08/01/2012   PHOS 4.2 08/01/2012       2d echo Left ventricle: The cavity size was moderately reduced. Mild to moderate LVH. Systolic function was hyperdynamic. - Right ventricle: The cavity size was moderately decreased. Wall thickness was increased.   Results for ESTREYA, CLAY (MRN [id number]) as of 08/01/2012 10:34  Ref. Range 06/13/2012 14:26  Iron Latest Range: 42-135 ug/dL 20 (L)  UIBC Latest Range: 125-400 ug/dL  308  TIBC Latest Range: 250-470 ug/dL 657  Saturation Ratios Latest Range: 20-55 % 6 (L)  Ferritin Latest Range: 10-291 ng/mL 42  Folate No range found 6.5        Impression: 1)Renal  AKI secondary to Prerenal/ ATn AKI most likely Non Oliguric ATN/Cardiorenal  Creat tending down. AKi improving.   2)HTN  Target Organ damage  CAD  Medication- On Diuretics  3)Anemia HGb NOt at goal (9--11) Iron deficincy  GI seen in recent past  4)CKD Mineral-Bone Disorder PTH elevated. Secondary Hyperparathyroidism  Present- on calcitriol Phosphorus at goal.   5)ID- Admitted with UTI Urine Culture negative primary team following   6)FEN  Normokalemic NOrmonatremic    7)Acid base Co2 at goal  8) CHF-LVH              High Likleyhood of Right sided hearty failure     Plan:  Will suggest to get 2 d echo with PAP and Right sided pressures Will suggest outpt Sleep study Will suggest IV iron ( ferriheme  IV ) Will continue current diuretics.      BHUTANI,MANPREET S 08/01/2012, 9:15 AM

## 2012-08-02 LAB — GLUCOSE, CAPILLARY
Glucose-Capillary: 193 mg/dL — ABNORMAL HIGH (ref 70–99)
Glucose-Capillary: 199 mg/dL — ABNORMAL HIGH (ref 70–99)
Glucose-Capillary: 189 mg/dL — ABNORMAL HIGH (ref 70–99)

## 2012-08-02 LAB — CBC
HCT: 27.1 % — ABNORMAL LOW (ref 36.0–46.0)
Hemoglobin: 8.3 g/dL — ABNORMAL LOW (ref 12.0–15.0)
RBC: 3.07 MIL/uL — ABNORMAL LOW (ref 3.87–5.11)
RDW: 22.3 % — ABNORMAL HIGH (ref 11.5–15.5)
WBC: 17.7 10*3/uL — ABNORMAL HIGH (ref 4.0–10.5)

## 2012-08-02 LAB — BASIC METABOLIC PANEL
BUN: 35 mg/dL — ABNORMAL HIGH (ref 6–23)
CO2: 22 mEq/L (ref 19–32)
Chloride: 107 mEq/L (ref 96–112)
GFR calc Af Amer: 38 mL/min — ABNORMAL LOW (ref >90)
Potassium: 4.4 mEq/L (ref 3.5–5.1)

## 2012-08-02 MED ORDER — FUROSEMIDE 10 MG/ML IJ SOLN
100.0000 mg | Freq: Once | INTRAVENOUS | Status: AC
  Administered 2012-08-02: 100 mg via INTRAVENOUS
  Filled 2012-08-02: qty 10

## 2012-08-02 MED ORDER — FUROSEMIDE 10 MG/ML IJ SOLN
100.0000 mg | Freq: Three times a day (TID) | INTRAVENOUS | Status: DC
  Administered 2012-08-02 – 2012-08-03 (×3): 100 mg via INTRAVENOUS
  Filled 2012-08-02 (×4): qty 10

## 2012-08-02 MED ORDER — ENOXAPARIN SODIUM 150 MG/ML ~~LOC~~ SOLN
130.0000 mg | Freq: Two times a day (BID) | SUBCUTANEOUS | Status: DC
  Administered 2012-08-02 – 2012-08-04 (×6): 130 mg via SUBCUTANEOUS
  Filled 2012-08-02 (×12): qty 1
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
## 2012-08-02 NOTE — Progress Notes (Signed)
Ann [name]  MRN: [id number]  DOB/AGE: 09/21/1945 67 y.o.  Primary Care Physician:HALL,ZACK, MD  Admit date: 07/30/2012  Chief Complaint:  Chief Complaint  Patient presents with  . Weakness    S-Pt presented on  07/30/2012 with  Chief Complaint  Patient presents with  . Weakness  .    Pt c/o nausea and emesis    NO c/o blood    Meds . aspirin EC  81 mg Oral Daily  . atorvastatin  80 mg Oral q1800  . calcitRIOL  0.25 mcg Oral Daily  . carbamide peroxide  5 drop Both Ears BID  . Chlorhexidine Gluconate Cloth  6 each Topical Q0600  . enoxaparin (LOVENOX) injection  130 mg Subcutaneous Q24H  . fesoterodine  8 mg Oral Daily  . fluticasone  1 spray Each Nare Daily  . furosemide  100 mg Intravenous Q8H  . gabapentin  100 mg Oral TID  . insulin aspart  0-5 Units Subcutaneous QHS  . insulin aspart  0-9 Units Subcutaneous TID WC  . insulin detemir  10 Units Subcutaneous QHS  . levalbuterol  0.63 mg Nebulization Q6H  . levothyroxine  200 mcg Oral Daily  . mometasone-formoterol  2 puff Inhalation BID  . mupirocin ointment  1 application Nasal BID  . pantoprazole  80 mg Oral Q1200  . polyethylene glycol  17 g Oral Daily  . predniSONE  5 mg Oral Daily  . sertraline  50 mg Oral Daily  . sodium chloride  3 mL Intravenous Q12H  . vitamin B-12  1,000 mcg Oral Daily      Physical Exam: Vital signs in last 24 hours: Temp:  [97.7 F (36.5 C)-98.3 F (36.8 C)] 98.3 F (36.8 C) (03/09 0505) Pulse Rate:  [85-88] 85 (03/09 0505) Resp:  [18-20] 20 (03/09 0505) BP: (93-104)/(52-75) 100/75 mmHg (03/09 0505) SpO2:  [88 %-99 %] 89 % (03/09 0754) Weight:  [697 lb 5 oz (316.3 kg)] 697 lb 5 oz (316.3 kg) (03/09 0500) Weight change:  Last BM Date: 07/29/12  Intake/Output from previous day: 03/08 0701 - 03/09 0700 In: -  Out: 2300 [Urine:2300]     Physical Exam: General- pt is awake,alert, follows comands Resp- No acute REsp distress, Decreased BS at bases. CVS- S1S2 regular  in rate and rhythm-distant GIT- BS+, soft, NT, ND, Morbidly obese EXT- 2+ LE Edema, NO Cyanosis   Lab Results: CBC  Recent Labs  08/01/12 0657 08/02/12 0534  WBC 7.4 17.7*  HGB 8.4* 8.3*  HCT 27.6* 27.1*  PLT 230 206    BMET  Recent Labs  08/01/12 0657 08/02/12 0534  NA 137 139  K 4.0 4.4  CL 104 107  CO2 22 22  GLUCOSE 145* 201*  BUN 40* 35*  CREATININE 2.46* 1.59*  CALCIUM 8.1* 8.4   Creat Trend 2014 3.62==>2.46==>1.59          2.4==>1.04 ( Last admission) 2013 0.8--1.0 2012 0.9--1.13    MICRO Recent Results (from the past 240 hour(s))  URINE CULTURE     Status: None   Collection Time    07/30/12  3:33 PM      Result Value Range Status   Specimen Description URINE, CATHETERIZED   Final   Special Requests NONE   Final   Culture  Setup Time 07/31/2012 01:06   Final   Colony Count NO GROWTH   Final   Culture NO GROWTH   Final   Report Status 07/31/2012 FINAL   Final  MRSA  PCR SCREENING     Status: Abnormal   Collection Time    07/30/12  6:00 PM      Result Value Range Status   MRSA by PCR POSITIVE (*) NEGATIVE Final   Comment:            The GeneXpert MRSA Assay (FDA     approved for NASAL specimens     only), is one component of a     comprehensive MRSA colonization     surveillance program. It is not     intended to diagnose MRSA     infection nor to guide or     monitor treatment for     MRSA infections.     CRITICAL RESULT CALLED TO, READ BACK BY AND VERIFIED WITH:     M. SIMPSON AT 2030 ON 07/30/12 BY Wynonia Lawman      Lab Results  Component Value Date   PTH 177.0* 06/28/2012   CALCIUM 8.4 08/02/2012   PHOS 4.2 08/01/2012       2d echo Left ventricle: The cavity size was moderately reduced. Mild to moderate LVH. Systolic function was hyperdynamic. - Right ventricle: The cavity size was moderately decreased. Wall thickness was increased.   Results for Ann, [name] (MRN [id number]) as of 08/01/2012 10:34  Ref. Range 06/13/2012  14:26  Iron Latest Range: 42-135 ug/dL 20 (L)  UIBC Latest Range: 125-400 ug/dL 409  TIBC Latest Range: 250-470 ug/dL 811  Saturation Ratios Latest Range: 20-55 % 6 (L)  Ferritin Latest Range: 10-291 ng/mL 42  Folate No range found 6.5        Impression: 1)Renal  AKI secondary to Prerenal/ ATn AKI most likely Non Oliguric ATN/Cardiorenal  Creat tending down. AKi improving.   2)HTN  Target Organ damage  CAD  Medication- On Diuretics  3)Anemia HGb NOt at goal (9--11) Iron deficincy  GI seen in recent past Pt recevied1 gram IV iron  4)CKD Mineral-Bone Disorder PTH elevated. Secondary Hyperparathyroidism  Present- on calcitriol Phosphorus at goal.   5)ID- Admitted with UTI Urine Culture negative primary team following   6)FEN  Normokalemic NOrmonatremic    7)Acid base Co2 at goal  8) CHF-LVH              High Likleyhood of Right sided hearty failure     Plan:  Will continue current care .      BHUTANI,MANPREET S 08/02/2012, 8:26 AM

## 2012-08-02 NOTE — Progress Notes (Addendum)
Subjective: This lady was admitted with confusion, felt to be secondary to another UTI. However she is very edematous in her legs and arms. Her urine culture actually was negative. I felt that she was in congestive heart failure and started her on Lasix. She was seen by nephrology, Dr Fausto Skillern, who agreed and has increased dose of Lasix given to her twice a day. She feels worse today. She has had a diuresis of 2300 cc in the last 24 hours. The weight recorded is not correct.          Physical Exam: Blood pressure 100/75, pulse 85, temperature 98.3 F (36.8 C), temperature source Oral, resp. rate 20, height 5\' 4"  (1.626 m), weight 316.3 kg (697 lb 5 oz), SpO2 89.00%. She looks chronically sick. She is cushingoid. She has peripheral pitting edema up to her thighs. She is morbidly obese. She cannot sit up for me to examine her lung fields. Her abdomen is soft and mildly tender in a generalized fashion. Heart sounds are present and appear to be in sinus rhythm at the present time. She is alert and orientated. There are no focal neurological signs.   Investigations:  Recent Results (from the past 240 hour(s))  URINE CULTURE     Status: None   Collection Time    07/30/12  3:33 PM      Result Value Range Status   Specimen Description URINE, CATHETERIZED   Final   Special Requests NONE   Final   Culture  Setup Time 07/31/2012 01:06   Final   Colony Count NO GROWTH   Final   Culture NO GROWTH   Final   Report Status 07/31/2012 FINAL   Final  MRSA PCR SCREENING     Status: Abnormal   Collection Time    07/30/12  6:00 PM      Result Value Range Status   MRSA by PCR POSITIVE (*) NEGATIVE Final   Comment:            The GeneXpert MRSA Assay (FDA     approved for NASAL specimens     only), is one component of a     comprehensive MRSA colonization     surveillance program. It is not     intended to diagnose MRSA     infection nor to guide or     monitor treatment for     MRSA  infections.     CRITICAL RESULT CALLED TO, READ BACK BY AND VERIFIED WITH:     M. SIMPSON AT 2030 ON 07/30/12 BY Wynonia Lawman     Basic Metabolic Panel:  Recent Labs  40/98/11 0657 08/02/12 0534  NA 137 139  K 4.0 4.4  CL 104 107  CO2 22 22  GLUCOSE 145* 201*  BUN 40* 35*  CREATININE 2.46* 1.59*  CALCIUM 8.1* 8.4  PHOS 4.2  --        CBC:  Recent Labs  07/30/12 1430  08/01/12 0657 08/02/12 0534  WBC 11.2*  < > 7.4 17.7*  NEUTROABS 7.7  --   --   --   HGB 9.4*  < > 8.4* 8.3*  HCT 31.3*  < > 27.6* 27.1*  MCV 88.7  < > 89.0 88.3  PLT 286  < > 230 206  < > = values in this interval not displayed.      Medications: I have reviewed the patient's current medications.  Impression: 1. Acute on chronic diastolic congestive heart failure,  still edematous. 2. Acute on chronic kidney disease with progressive renal failure, improving. 3. Morbid obesity. 4. Atrial fibrillation, currently appears to be in sinus rhythm. 5. Hypothyroidism.     Plan: 1. Increase Lasix to 100 mg IV every 8 hours. Reduce IV fluids to 50 cc an hour. Monitor weights closely.     LOS: 3 days   Wilson Singer Pager [phone]  08/02/2012, 8:22 AM

## 2012-08-03 DIAGNOSIS — A419 Sepsis, unspecified organism: Secondary | ICD-10-CM | POA: Diagnosis not present

## 2012-08-03 DIAGNOSIS — I5032 Chronic diastolic (congestive) heart failure: Secondary | ICD-10-CM | POA: Diagnosis not present

## 2012-08-03 DIAGNOSIS — E1165 Type 2 diabetes mellitus with hyperglycemia: Secondary | ICD-10-CM | POA: Diagnosis not present

## 2012-08-03 DIAGNOSIS — N179 Acute kidney failure, unspecified: Secondary | ICD-10-CM | POA: Diagnosis not present

## 2012-08-03 DIAGNOSIS — I509 Heart failure, unspecified: Secondary | ICD-10-CM | POA: Diagnosis not present

## 2012-08-03 LAB — COMPREHENSIVE METABOLIC PANEL
CO2: 24 mEq/L (ref 19–32)
Calcium: 8.4 mg/dL (ref 8.4–10.5)
Creatinine, Ser: 1.39 mg/dL — ABNORMAL HIGH (ref 0.50–1.10)
GFR calc Af Amer: 45 mL/min — ABNORMAL LOW (ref >90)
GFR calc non Af Amer: 39 mL/min — ABNORMAL LOW (ref >90)
Glucose, Bld: 155 mg/dL — ABNORMAL HIGH (ref 70–99)

## 2012-08-03 LAB — GLUCOSE, CAPILLARY
Glucose-Capillary: 151 mg/dL — ABNORMAL HIGH (ref 70–99)
Glucose-Capillary: 183 mg/dL — ABNORMAL HIGH (ref 70–99)
Glucose-Capillary: 172 mg/dL — ABNORMAL HIGH (ref 70–99)

## 2012-08-03 LAB — CBC
Hemoglobin: 8.5 g/dL — ABNORMAL LOW (ref 12.0–15.0)
MCH: 26.6 pg (ref 26.0–34.0)
MCV: 88.4 fL (ref 78.0–100.0)
Platelets: 221 10*3/uL (ref 150–400)
RBC: 3.2 MIL/uL — ABNORMAL LOW (ref 3.87–5.11)

## 2012-08-03 MED ORDER — FUROSEMIDE 10 MG/ML IJ SOLN
160.0000 mg | Freq: Three times a day (TID) | INTRAVENOUS | Status: DC
  Administered 2012-08-03 – 2012-08-04 (×3): 160 mg via INTRAVENOUS
  Filled 2012-08-03 (×4): qty 16

## 2012-08-03 MED ORDER — ALBUMIN HUMAN 25 % IV SOLN
25.0000 g | Freq: Two times a day (BID) | INTRAVENOUS | Status: AC
  Administered 2012-08-03 (×2): 25 g via INTRAVENOUS
  Filled 2012-08-03 (×2): qty 100

## 2012-08-03 NOTE — Progress Notes (Signed)
Patient is currently active with [facility] Care Management for chronic disease management services.  Patient has been engaged by a [employer].  Our community based plan of care has focused on disease management of COPD, HTN, and DM.  Patient uses [facility] for home health.  Receiving RN PT OT services.  Has DME hospital bed and nebulizer machine.  Patient will receive a post discharge transition of care call and will be evaluated for monthly home visits for assessments and disease process education.  Made inpatient Case Manager Arlyss Queen RN aware that [facility] Care Management following. Of note, [facility] Care Management services does not replace or interfere with any services that are arranged by inpatient case management or social work.  For additional questions or referrals please contact Anibal Henderson BSN RN [facility] Liaison at [phone].

## 2012-08-03 NOTE — Progress Notes (Signed)
Subjective: This lady was admitted with confusion, felt to be secondary to another UTI. However she is very edematous in her legs and arms. Her urine culture actually was negative. I felt that she was in congestive heart failure and started her on Lasix. She was seen by nephrology, Dr Fausto Skillern, who agreed and has increased dose of Lasix given to her twice a day. She today is not feeling better. Dr Fausto Skillern has discontinued her fluids, increased her Lasix dose further 160 mg twice a day and has started her on albumin also.          Physical Exam: Blood pressure 146/67, pulse 99, temperature 98.2 F (36.8 C), temperature source Oral, resp. rate 20, height 5\' 4"  (1.626 m), weight 141.114 kg (311 lb 1.6 oz), SpO2 93.00%. She looks chronically sick. She is cushingoid. She has peripheral pitting edema up to her thighs. She is morbidly obese. She cannot sit up for me to examine her lung fields. Her abdomen is soft and mildly tender in a generalized fashion. Heart sounds are present and appear to be in sinus rhythm at the present time. She is alert and orientated. There are no focal neurological signs.   Investigations:  Recent Results (from the past 240 hour(s))  URINE CULTURE     Status: None   Collection Time    07/30/12  3:33 PM      Result Value Range Status   Specimen Description URINE, CATHETERIZED   Final   Special Requests NONE   Final   Culture  Setup Time 07/31/2012 01:06   Final   Colony Count NO GROWTH   Final   Culture NO GROWTH   Final   Report Status 07/31/2012 FINAL   Final  CULTURE, BLOOD (ROUTINE X 2)     Status: None   Collection Time    07/30/12  5:31 PM      Result Value Range Status   Specimen Description BLOOD RIGHT HAND   Final   Special Requests BOTTLES DRAWN AEROBIC ONLY 4CC   Final   Culture NO GROWTH 4 DAYS   Final   Report Status PENDING   Incomplete  CULTURE, BLOOD (ROUTINE X 2)     Status: None   Collection Time    07/30/12  5:31 PM      Result  Value Range Status   Specimen Description BLOOD LEFT ANTECUBITAL   Final   Special Requests BOTTLES DRAWN AEROBIC AND ANAEROBIC 6CC   Final   Culture NO GROWTH 4 DAYS   Final   Report Status PENDING   Incomplete  MRSA PCR SCREENING     Status: Abnormal   Collection Time    07/30/12  6:00 PM      Result Value Range Status   MRSA by PCR POSITIVE (*) NEGATIVE Final   Comment:            The GeneXpert MRSA Assay (FDA     approved for NASAL specimens     only), is one component of a     comprehensive MRSA colonization     surveillance program. It is not     intended to diagnose MRSA     infection nor to guide or     monitor treatment for     MRSA infections.     CRITICAL RESULT CALLED TO, READ BACK BY AND VERIFIED WITH:     M. SIMPSON AT 2030 ON 07/30/12 BY Wynonia Lawman     Basic  Metabolic Panel:  Recent Labs  16/10/96 0657 08/02/12 0534 08/03/12 0445  NA 137 139 142  K 4.0 4.4 4.0  CL 104 107 108  CO2 22 22 24   GLUCOSE 145* 201* 155*  BUN 40* 35* 33*  CREATININE 2.46* 1.59* 1.39*  CALCIUM 8.1* 8.4 8.4  PHOS 4.2  --   --        CBC:  Recent Labs  08/02/12 0534 08/03/12 0445  WBC 17.7* 17.5*  HGB 8.3* 8.5*  HCT 27.1* 28.3*  MCV 88.3 88.4  PLT 206 221        Medications: I have reviewed the patient's current medications.  Impression: 1. Acute on chronic diastolic congestive heart failure, still edematous. 2. Acute on chronic kidney disease with progressive renal failure, improving. 3. Morbid obesity. 4. Atrial fibrillation, currently appears to be in sinus rhythm. 5. Hypothyroidism.     Plan: 1. Agree with increasing Lasix. Agree with albumin. 2. Prognosis here is rather poor. The patient I believe is aware of this. I spoke with Dr. Juanetta Gosling today. Apparently this patient's primary care physician is Dr. Juanetta Gosling and Dr. Juanetta Gosling has agreed to take over the care of this patient..     LOS: 4 days   Wilson Singer Pager [phone]  08/03/2012,  11:48 AM

## 2012-08-03 NOTE — Progress Notes (Signed)
Subjective: Interval History: has no complaint of difficulty in breathing. Patient denies any nausea or vomiting.  Objective: Vital signs in last 24 hours: Temp:  [97 F (36.1 C)-97.8 F (36.6 C)] 97.8 F (36.6 C) (03/10 0435) Pulse Rate:  [93-94] 94 (03/10 0435) Resp:  [20] 20 (03/10 0435) BP: (111-116)/(57-68) 111/65 mmHg (03/10 0435) SpO2:  [91 %-96 %] 93 % (03/10 0729) Weight:  [141.114 kg (311 lb 1.6 oz)] 141.114 kg (311 lb 1.6 oz) (03/09 1100) Weight change: 0.417 kg (14.7 oz)  Intake/Output from previous day: 03/09 0701 - 03/10 0700 In: 4246 [P.O.:600; I.V.:3646] Out: 1075 [Urine:1075] Intake/Output this shift:    General appearance: alert, cooperative and mild distress Resp: diminished breath sounds anterior - bilateral and rales anterior - bilateral Cardio: regular rate and rhythm, S1, S2 normal, no murmur, click, rub or gallop GI: Positive bowel sound and nontender. Extremities: edema 2-3+ edema bilaterally.  Lab Results:  Recent Labs  08/02/12 0534 08/03/12 0445  WBC 17.7* 17.5*  HGB 8.3* 8.5*  HCT 27.1* 28.3*  PLT 206 221   BMET:  Recent Labs  08/02/12 0534 08/03/12 0445  NA 139 142  K 4.4 4.0  CL 107 108  CO2 22 24  GLUCOSE 201* 155*  BUN 35* 33*  CREATININE 1.59* 1.39*  CALCIUM 8.4 8.4   No results found for this basename: PTH,  in the last 72 hours Iron Studies: No results found for this basename: IRON, TIBC, TRANSFERRIN, FERRITIN,  in the last 72 hours  Studies/Results: No results found.  I have reviewed the patient's current medications.  Assessment/Plan: Problem #1 renal failure chronic her BUN is 35 and  creatinine is 1.39 renal function is stable. Presently patient denies any nausea vomiting. Problem #2 anasarca at this moment seems to be worsening. Patient is none oliguric. She has also severe  hypoalbuminemia most likely secondary to poor nutrition.  Problem #3 septic shock: Blood pressure slightly better still her white cell  count is elevated. Presently patient is a febrile. Problem #4 hypothyroidism Problem #5 history of a trial fibrillation Problem #6 diabetes Problem #7 history of her renal insufficiency. Problem #8 metabolic bone disease her calcium and phosphorus in acceptable range. Patient was secondary hyperparathyroidism she is on Rocaltrol. Recommendation: 1] we'll DC her IV fluid 2] and we'll increase her Lasix to 160 mg IV twice a day 3] we'll albumin 25 g IV twice a day for 2 doses. 4] we'll follow her basic metabolic panel in the morning.   LOS: 4 days   BEFEKADU,BELAYENH S 08/03/2012,8:02 AM

## 2012-08-04 DIAGNOSIS — D649 Anemia, unspecified: Secondary | ICD-10-CM | POA: Diagnosis not present

## 2012-08-04 DIAGNOSIS — N179 Acute kidney failure, unspecified: Secondary | ICD-10-CM | POA: Diagnosis not present

## 2012-08-04 DIAGNOSIS — I509 Heart failure, unspecified: Secondary | ICD-10-CM | POA: Diagnosis not present

## 2012-08-04 LAB — CULTURE, BLOOD (ROUTINE X 2)

## 2012-08-04 LAB — BASIC METABOLIC PANEL
BUN: 30 mg/dL — ABNORMAL HIGH (ref 6–23)
Chloride: 106 mEq/L (ref 96–112)
GFR calc Af Amer: 40 mL/min — ABNORMAL LOW (ref >90)
GFR calc non Af Amer: 35 mL/min — ABNORMAL LOW (ref >90)
Potassium: 3.8 mEq/L (ref 3.5–5.1)
Sodium: 143 mEq/L (ref 135–145)

## 2012-08-04 LAB — CBC
HCT: 28.7 % — ABNORMAL LOW (ref 36.0–46.0)
MCHC: 30 g/dL (ref 30.0–36.0)
Platelets: 208 10*3/uL (ref 150–400)
RDW: 22.9 % — ABNORMAL HIGH (ref 11.5–15.5)
WBC: 13.5 10*3/uL — ABNORMAL HIGH (ref 4.0–10.5)

## 2012-08-04 LAB — GLUCOSE, CAPILLARY: Glucose-Capillary: 141 mg/dL — ABNORMAL HIGH (ref 70–99)

## 2012-08-04 LAB — PHOSPHORUS: Phosphorus: 2.7 mg/dL (ref 2.3–4.6)

## 2012-08-04 MED ORDER — POLYVINYL ALCOHOL 1.4 % OP SOLN
1.0000 [drp] | OPHTHALMIC | Status: DC | PRN
  Administered 2012-08-04 – 2012-08-10 (×2): 1 [drp] via OPHTHALMIC
  Filled 2012-08-04 (×2): qty 15

## 2012-08-04 MED ORDER — TORSEMIDE 20 MG PO TABS
50.0000 mg | ORAL_TABLET | Freq: Every day | ORAL | Status: DC
  Administered 2012-08-04 – 2012-08-05 (×2): 50 mg via ORAL
  Filled 2012-08-04 (×2): qty 3

## 2012-08-04 NOTE — Evaluation (Signed)
Physical Therapy Evaluation Patient Details Name: Ann [name] MRN: [id number] DOB: July 21, 1945 Today's Date: 08/04/2012 Time: 4098-1191 PT Time Calculation (min): 48 min  PT Assessment / Plan / Recommendation Clinical Impression  Pt was seen for eval.  She is very alert and oriented, cooperative.  She is extremely edemetous on top of morbid obesity and states that she hasn't been able to ambulate in about 6 weeks.  She has, however, been able to transfer bed to [facility] with assist and has been up to a recliner at home.IIf her medical status permits, she should be able to tolerate a gentle strengthening program at SNF.    PT Assessment  Patient needs continued PT services    Follow Up Recommendations  SNF    Does the patient have the potential to tolerate intense rehabilitation      Barriers to Discharge Decreased caregiver support husband has CG fatigue    Equipment Recommendations  None recommended by PT    Recommendations for Other Services     Frequency Min 3X/week    Precautions / Restrictions Precautions Precautions: Fall Restrictions Weight Bearing Restrictions: No   Pertinent Vitals/Pain       Mobility  Bed Mobility Bed Mobility: Supine to Sit;Sitting - Scoot to Edge of Bed Supine to Sit: 3: Mod assist;HOB elevated Sitting - Scoot to Edge of Bed: 5: Supervision Details for Bed Mobility Assistance: HOB elevated to the max Transfers Transfers: Sit to Stand;Stand to Sit Sit to Stand: 4: Min assist;From bed Stand to Sit: 4: Min guard;To chair/3-in-1 Details for Transfer Assistance: bed needed to be elevated about 6" higher than chair seat in order for pt to stand Ambulation/Gait Ambulation/Gait Assistance: 4: Min guard Ambulation Distance (Feet): 3 Feet Assistive device: Rolling walker Gait Pattern: Within Functional Limits Stairs: No Wheelchair Mobility Wheelchair Mobility: No    Exercises General Exercises - Lower Extremity Ankle Circles/Pumps:  AROM;Both;10 reps;Supine;Strengthening Heel Slides: AROM;Both;10 reps;Supine;Strengthening Hip ABduction/ADduction: AROM;Strengthening;Both;10 reps;Supine Straight Leg Raises: AROM;Both;10 reps;Supine   PT Diagnosis: Generalized weakness  PT Problem List: Decreased strength;Decreased activity tolerance;Decreased mobility;Cardiopulmonary status limiting activity;Obesity PT Treatment Interventions: Functional mobility training;Therapeutic activities;Therapeutic exercise   PT Goals Acute Rehab PT Goals PT Goal Formulation: With patient Time For Goal Achievement: 08/18/12 Potential to Achieve Goals: Good Pt will go Supine/Side to Sit: with supervision PT Goal: Supine/Side to Sit - Progress: Goal set today Pt will go Sit to Stand: with supervision;with upper extremity assist (surface not elevated) PT Goal: Sit to Stand - Progress: Goal set today Pt will Transfer Bed to Chair/Chair to Bed: with supervision PT Transfer Goal: Bed to Chair/Chair to Bed - Progress: Goal set today Pt will Ambulate: 1 - 15 feet;with min assist;with rolling walker PT Goal: Ambulate - Progress: Goal set today  Visit Information  Last PT Received On: 08/04/12    Subjective Data  Subjective: I could walk a little up until 6 weeks ago Patient Stated Goal: none stated   Prior Functioning  Home Living Lives With: Spouse Available Help at Discharge: Family Type of Home: House Home Access: Ramped entrance Home Layout: One level Home Adaptive Equipment: Walker - rolling;Wheelchair - manual;Bedside commode/3-in-1;Hospital bed Prior Function Level of Independence: Needs assistance Needs Assistance: Bathing;Dressing;Grooming;Toileting;Meal Prep;Light Housekeeping Bath: Maximal Dressing: Maximal Grooming: Moderate Toileting: Moderate Meal Prep: Total Light Housekeeping: Total Able to Take Stairs?: No Driving: No 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 Overall Cognitive Status: Appears within functional limits for tasks assessed/performed Arousal/Alertness: Awake/alert Orientation  Level: Appears intact for tasks assessed Behavior During Session: [facility] for tasks performed    Extremity/Trunk Assessment Right Lower Extremity Assessment RLE ROM/Strength/Tone: [facility] for tasks assessed;Deficits RLE ROM/Strength/Tone Deficits: severe edema RLE Sensation: WFL - Light Touch RLE Coordination: WFL - gross motor Left Lower Extremity Assessment LLE ROM/Strength/Tone: WFL for tasks assessed;Deficits LLE ROM/Strength/Tone Deficits: severe edema LLE Sensation: WFL - Light Touch LLE Coordination: WFL - gross motor Trunk Assessment Trunk Assessment: Normal   Balance Balance Balance Assessed: No (WNL by functional observation)  End of Session PT - End of Session Equipment Utilized During Treatment: Gait belt Activity Tolerance: Patient tolerated treatment well Patient left: in chair;with call bell/phone within reach;with chair alarm set;with nursing in room Nurse Communication: Mobility status  GP     Konrad Penta 08/04/2012, 12:48 PM

## 2012-08-04 NOTE — Clinical Social Work Psychosocial (Signed)
Clinical Social Work Department BRIEF PSYCHOSOCIAL ASSESSMENT 08/04/2012  Patient:  Ann [name], Ann [name]     Account Number:  [id number]     Admit date:  07/30/2012  Clinical Social Worker:  Nancie Neas  Date/Time:  08/04/2012 03:05 PM  Referred by:  Care Management  Date Referred:  08/04/2012 Referred for  SNF Placement   Other Referral:   Interview type:  Patient Other interview type:   and family    PSYCHOSOCIAL DATA Living Status:  FAMILY Admitted from facility:   Level of care:   Primary support name:  Ann [name] Primary support relationship to patient:  SPOUSE Degree of support available:   supportive per pt    CURRENT CONCERNS Current Concerns  Post-Acute Placement   Other Concerns:    SOCIAL WORK ASSESSMENT / PLAN CSW met with pt and family at bedside. Pt's husband, son, daughter, and granddaughter present during assessment. Pt alert and oriented and reports that in addition to her husband, she has several other adult family members that live in the home. Pt states that she manages okay with assistance at home. Pt evaluated by PT today and recommendation was for SNF. CSW discussed placement process. However, she feels she is at baseline. Family supportive of pt making decision on disposition. Pt states that she managed before and would like to return home, resuming home health services. Pt has walker, wheelchair, BSC, and motorized wheelchair. She has transportation to appointments with family. Pt does not feel there is anything else she will need at home.   Assessment/plan status:  Referral to [facility] Other assessment/ plan:   Information/referral to community resources:   CM to resume home health  SNF list    PATIENT'S/FAMILY'S RESPONSE TO PLAN OF CARE: Pt wants to return home and family is supportive of her decision. CM notified. CSW signing off but can be reconsulted if needed.        Derenda Fennel, [place] [phone]

## 2012-08-04 NOTE — Progress Notes (Signed)
Subjective: Interval History: has no complaint of difficulty in breathing. She is still cough but no sputum production. She denies any nausea vomiting. He.  Objective: Vital signs in last 24 hours: Temp:  [97.4 F (36.3 C)-99.8 F (37.7 C)] 97.8 F (36.6 C) (03/11 0531) Pulse Rate:  [81-99] 83 (03/11 0531) Resp:  [20] 20 (03/11 0531) BP: (87-146)/(54-73) 130/69 mmHg (03/11 0531) SpO2:  [92 %-95 %] 92 % (03/11 0725) Weight:  [138.12 kg (304 lb 8 oz)] 138.12 kg (304 lb 8 oz) (03/11 0500) Weight change: -2.994 kg (-6 lb 9.6 oz)  Intake/Output from previous day: 03/10 0701 - 03/11 0700 In: 1826 [P.O.:360; I.V.:450; IV Piggyback:166] Out: -  Intake/Output this shift:    General appearance: alert, cooperative and mild distress Resp: rales bilaterally Cardio: regular rate and rhythm, S1, S2 normal, no murmur, click, rub or gallop GI: soft, non-tender; bowel sounds normal; no masses,  no organomegaly Extremities: edema 3+ edema  Lab Results:  Recent Labs  08/03/12 0445 08/04/12 0545  WBC 17.5* 13.5*  HGB 8.5* 8.6*  HCT 28.3* 28.7*  PLT 221 208   BMET:  Recent Labs  08/03/12 0445 08/04/12 0545  NA 142 143  K 4.0 3.8  CL 108 106  CO2 24 25  GLUCOSE 155* 139*  BUN 33* 30*  CREATININE 1.39* 1.52*  CALCIUM 8.4 8.5   No results found for this basename: PTH,  in the last 72 hours Iron Studies: No results found for this basename: IRON, TIBC, TRANSFERRIN, FERRITIN,  in the last 72 hours  Studies/Results: No results found.  I have reviewed the patient's current medications.  Assessment/Plan: Problem #1 renal failure chronic her BUN is 30 creatinine 1.52 stable. Problem #2 anasarca she is on Lasix her urine output is improving still patient with significant fluid overload Problem #3 history of hypothyroidism Problem #4 coronary disease Problem #5 a trial fibrillation  Problem #6 anemia her hemoglobin and hematocrit is stable Problem #7 sepsis patient presently is a  febrile her white cell count is improving. Plan: Patient prognosis is very poor. We'll start her on Demadex 50 mg once a day and DC Lasix.  We'll continue his other medications as before.   LOS: 5 days   BEFEKADU,BELAYENH S 08/04/2012,8:37 AM

## 2012-08-04 NOTE — Progress Notes (Signed)
Subjective: I have assumed her care at this point. She has multiple medical problems and has been very drowsy. She has problems with decreased urine output and acute renal failure COPD hypertension GERD diabetes neuropathy cellulitis and adrenal insufficiency she has massive anasarca now  Objective: Vital signs in last 24 hours: Temp:  [97.4 F (36.3 C)-99.8 F (37.7 C)] 97.8 F (36.6 C) (03/11 0531) Pulse Rate:  [81-99] 83 (03/11 0531) Resp:  [20] 20 (03/11 0531) BP: (87-146)/(54-73) 130/69 mmHg (03/11 0531) SpO2:  [92 %-95 %] 92 % (03/11 0725) Weight:  [138.12 kg (304 lb 8 oz)] 138.12 kg (304 lb 8 oz) (03/11 0500) Weight change: -2.994 kg (-6 lb 9.6 oz) Last BM Date: 08/03/12  Intake/Output from previous day: 03/10 0701 - 03/11 0700 In: 1826 [P.O.:360; I.V.:450; IV Piggyback:166] Out: -   PHYSICAL EXAM General appearance: alert, morbidly obese and Massive edema throughout Resp: rhonchi bilaterally Cardio: regular rate and rhythm, S1, S2 normal, no murmur, click, rub or gallop GI: soft, non-tender; bowel sounds normal; no masses,  no organomegaly Extremities: edema 2-3+ edema  Lab Results:    Basic Metabolic Panel:  Recent Labs  16/10/96 0445 08/04/12 0545  NA 142 143  K 4.0 3.8  CL 108 106  CO2 24 25  GLUCOSE 155* 139*  BUN 33* 30*  CREATININE 1.39* 1.52*  CALCIUM 8.4 8.5  PHOS  --  2.7   Liver Function Tests:  Recent Labs  08/03/12 0445  AST 14  ALT 17  ALKPHOS 85  BILITOT 0.3  PROT 5.3*  ALBUMIN 2.1*   No results found for this basename: LIPASE, AMYLASE,  in the last 72 hours No results found for this basename: AMMONIA,  in the last 72 hours CBC:  Recent Labs  08/03/12 0445 08/04/12 0545  WBC 17.5* 13.5*  HGB 8.5* 8.6*  HCT 28.3* 28.7*  MCV 88.4 89.4  PLT 221 208   Cardiac Enzymes: No results found for this basename: CKTOTAL, CKMB, CKMBINDEX, TROPONINI,  in the last 72 hours BNP: No results found for this basename: PROBNP,  in the last  72 hours D-Dimer: No results found for this basename: DDIMER,  in the last 72 hours CBG:  Recent Labs  08/03/12 0726 08/03/12 1110 08/03/12 1231 08/03/12 1615 08/03/12 2121 08/04/12 0754  GLUCAP 151* 183* 192* 187* 172* 141*   Hemoglobin A1C: No results found for this basename: HGBA1C,  in the last 72 hours Fasting Lipid Panel: No results found for this basename: CHOL, HDL, LDLCALC, TRIG, CHOLHDL, LDLDIRECT,  in the last 72 hours Thyroid Function Tests: No results found for this basename: TSH, T4TOTAL, FREET4, T3FREE, THYROIDAB,  in the last 72 hours Anemia Panel: No results found for this basename: VITAMINB12, FOLATE, FERRITIN, TIBC, IRON, RETICCTPCT,  in the last 72 hours Coagulation: No results found for this basename: LABPROT, INR,  in the last 72 hours Urine Drug Screen: Drugs of Abuse  No results found for this basename: labopia, cocainscrnur, labbenz, amphetmu, thcu, labbarb    Alcohol Level: No results found for this basename: ETH,  in the last 72 hours Urinalysis: No results found for this basename: COLORURINE, APPERANCEUR, LABSPEC, PHURINE, GLUCOSEU, HGBUR, BILIRUBINUR, KETONESUR, PROTEINUR, UROBILINOGEN, NITRITE, LEUKOCYTESUR,  in the last 72 hours Misc. Labs:  ABGS No results found for this basename: PHART, PCO2, PO2ART, TCO2, HCO3,  in the last 72 hours CULTURES Recent Results (from the past 240 hour(s))  URINE CULTURE     Status: None   Collection Time  07/30/12  3:33 PM      Result Value Range Status   Specimen Description URINE, CATHETERIZED   Final   Special Requests NONE   Final   Culture  Setup Time 07/31/2012 01:06   Final   Colony Count NO GROWTH   Final   Culture NO GROWTH   Final   Report Status 07/31/2012 FINAL   Final  CULTURE, BLOOD (ROUTINE X 2)     Status: None   Collection Time    07/30/12  5:31 PM      Result Value Range Status   Specimen Description BLOOD RIGHT HAND   Final   Special Requests BOTTLES DRAWN AEROBIC ONLY 4CC   Final    Culture NO GROWTH 4 DAYS   Final   Report Status PENDING   Incomplete  CULTURE, BLOOD (ROUTINE X 2)     Status: None   Collection Time    07/30/12  5:31 PM      Result Value Range Status   Specimen Description BLOOD LEFT ANTECUBITAL   Final   Special Requests BOTTLES DRAWN AEROBIC AND ANAEROBIC 6CC   Final   Culture NO GROWTH 4 DAYS   Final   Report Status PENDING   Incomplete  MRSA PCR SCREENING     Status: Abnormal   Collection Time    07/30/12  6:00 PM      Result Value Range Status   MRSA by PCR POSITIVE (*) NEGATIVE Final   Comment:            The GeneXpert MRSA Assay (FDA     approved for NASAL specimens     only), is one component of a     comprehensive MRSA colonization     surveillance program. It is not     intended to diagnose MRSA     infection nor to guide or     monitor treatment for     MRSA infections.     CRITICAL RESULT CALLED TO, READ BACK BY AND VERIFIED WITH:     M. SIMPSON AT 2030 ON 07/30/12 BY S. VANHOORNE   Studies/Results: No results found.  Medications:  Prior to Admission:  Prescriptions prior to admission  Medication Sig Dispense Refill  . albuterol (PROVENTIL HFA;VENTOLIN HFA) 108 (90 BASE) MCG/ACT inhaler Inhale 2 puffs into the lungs 2 (two) times daily. For shortness of breath      . ALPRAZolam (XANAX) 0.5 MG tablet Take 1 tablet (0.5 mg total) by mouth 3 (three) times daily as needed. For anxiety  5 tablet  0  . aspirin EC 81 MG tablet Take 81 mg by mouth daily.      . benzonatate (TESSALON) 100 MG capsule Take 1 capsule (100 mg total) by mouth 3 (three) times daily.  20 capsule  0  . calcitRIOL (ROCALTROL) 0.25 MCG capsule Take 1 capsule (0.25 mcg total) by mouth daily.      . carbamide peroxide (DEBROX) 6.5 % otic solution Place 5 drops into both ears 2 (two) times daily.  15 mL  0  . digoxin (LANOXIN) 0.125 MG tablet Take 1 tablet (0.125 mg total) by mouth daily.  30 tablet  0  . esomeprazole (NEXIUM) 40 MG capsule Take 40 mg by mouth 2  (two) times daily.       . fluticasone (FLONASE) 50 MCG/ACT nasal spray Place 1 spray into the nose daily.        . Fluticasone-Salmeterol (ADVAIR) 250-50 MCG/DOSE AEPB Inhale 1 puff  into the lungs every 12 (twelve) hours.        . gabapentin (NEURONTIN) 100 MG capsule Take 100 mg by mouth 3 (three) times daily.      Marland Kitchen HYDROcodone-acetaminophen (NORCO/VICODIN) 5-325 MG per tablet Take 1 tablet by mouth every 4 (four) hours as needed.  30 tablet  0  . insulin aspart (NOVOLOG) 100 UNIT/ML injection Inject 10 Units into the skin 3 (three) times daily with meals.  1 vial  0  . insulin detemir (LEVEMIR FLEXPEN) 100 UNIT/ML injection Inject 60 Units into the skin at bedtime.  10 mL  12  . ipratropium (ATROVENT) 0.02 % nebulizer solution Take 2.5 mLs (0.5 mg total) by nebulization every 6 (six) hours.  75 mL  0  . iron polysaccharides (NIFEREX) 150 MG capsule Take 1 capsule (150 mg total) by mouth 2 (two) times daily.  30 capsule  0  . levalbuterol (XOPENEX) 0.63 MG/3ML nebulizer solution Take 3 mLs (0.63 mg total) by nebulization every 6 (six) hours.  3 mL  0  . levothyroxine (SYNTHROID, LEVOTHROID) 200 MCG tablet Take 200 mcg by mouth daily.      Marland Kitchen linagliptin (TRADJENTA) 5 MG TABS tablet Take 5 mg by mouth every morning.       . nitroGLYCERIN (NITROSTAT) 0.4 MG SL tablet Place 1 tablet (0.4 mg total) under the tongue every 5 (five) minutes as needed for chest pain.  25 tablet  3  . polyethylene glycol (MIRALAX / GLYCOLAX) packet Take 17 g by mouth daily.  14 each    . predniSONE (DELTASONE) 5 MG tablet Take 1 tablet (5 mg total) by mouth daily.  30 tablet  0  . Rivaroxaban (XARELTO) 20 MG TABS Take 20 mg by mouth daily.      . rosuvastatin (CRESTOR) 40 MG tablet Take 1 tablet (40 mg total) by mouth at bedtime.  90 tablet  3  . sertraline (ZOLOFT) 50 MG tablet Take 50 mg by mouth daily.      . sodium chloride (OCEAN) 0.65 % SOLN nasal spray Place 1 spray into the nose as needed for congestion.      Marland Kitchen  tolterodine (DETROL LA) 4 MG 24 hr capsule Take 4 mg by mouth every morning.       . torsemide (DEMADEX) 20 MG tablet Take 1 tablet (20 mg total) by mouth daily.  30 tablet  0  . vitamin B-12 1000 MCG tablet Take 1 tablet (1,000 mcg total) by mouth daily.  30 tablet  0   Scheduled: . aspirin EC  81 mg Oral Daily  . atorvastatin  80 mg Oral q1800  . calcitRIOL  0.25 mcg Oral Daily  . carbamide peroxide  5 drop Both Ears BID  . Chlorhexidine Gluconate Cloth  6 each Topical Q0600  . enoxaparin (LOVENOX) injection  130 mg Subcutaneous BID  . fesoterodine  8 mg Oral Daily  . fluticasone  1 spray Each Nare Daily  . gabapentin  100 mg Oral TID  . insulin aspart  0-5 Units Subcutaneous QHS  . insulin aspart  0-9 Units Subcutaneous TID WC  . insulin detemir  10 Units Subcutaneous QHS  . levalbuterol  0.63 mg Nebulization Q6H  . levothyroxine  200 mcg Oral Daily  . mometasone-formoterol  2 puff Inhalation BID  . pantoprazole  80 mg Oral Q1200  . polyethylene glycol  17 g Oral Daily  . predniSONE  5 mg Oral Daily  . sertraline  50 mg Oral Daily  .  sodium chloride  3 mL Intravenous Q12H  . vitamin B-12  1,000 mcg Oral Daily   Continuous:  VHQ:[id number] injection, ondansetron (ZOFRAN) IV, ondansetron  Assesment: He she has multiple medical problems. Currently the biggest problem is that she has anasarca , acute on chronic diastolic heart failure, acute renal failure. I discussed this with her and her husband and told them that she may need rehabilitation placement and also discussed hospice care with them Principal Problem:   Septic shock Active Problems:   HYPOTHYROIDISM   HYPERLIPIDEMIA   HYPERTENSION   CAD   GASTROESOPHAGEAL REFLUX DISEASE   Cellulitis of leg without foot, left   Diabetes mellitus type 2 with complications   Chronic diastolic heart failure   ARF (acute renal failure)   Chronic anticoagulation   Morbid obesity   Chronic kidney disease, stage III (moderate)   Atrial  fibrillation   Hypotension, unspecified   UTI (urinary tract infection)    Plan: Continue current medications. I think she is mildly improved    LOS: 5 days   HAWKINS,EDWARD L 08/04/2012, 8:39 AM

## 2012-08-04 NOTE — Plan of Care (Signed)
Problem: Phase I Progression Outcomes Goal: OOB as tolerated unless otherwise ordered Outcome: Completed/Met Date Met:  08/04/12 Patient OOB to chair and BSC with 2 assist today Goal: Voiding-avoid urinary catheter unless indicated Outcome: Not Progressing Foley catheter in place

## 2012-08-05 DIAGNOSIS — D649 Anemia, unspecified: Secondary | ICD-10-CM | POA: Diagnosis not present

## 2012-08-05 DIAGNOSIS — E119 Type 2 diabetes mellitus without complications: Secondary | ICD-10-CM | POA: Diagnosis not present

## 2012-08-05 DIAGNOSIS — I10 Essential (primary) hypertension: Secondary | ICD-10-CM | POA: Diagnosis not present

## 2012-08-05 LAB — CBC
HCT: 29.2 % — ABNORMAL LOW (ref 36.0–46.0)
Hemoglobin: 8.7 g/dL — ABNORMAL LOW (ref 12.0–15.0)
MCHC: 29.8 g/dL — ABNORMAL LOW (ref 30.0–36.0)
RBC: 3.26 MIL/uL — ABNORMAL LOW (ref 3.87–5.11)
WBC: 10.5 10*3/uL (ref 4.0–10.5)

## 2012-08-05 LAB — BASIC METABOLIC PANEL
BUN: 30 mg/dL — ABNORMAL HIGH (ref 6–23)
Chloride: 104 mEq/L (ref 96–112)
GFR calc non Af Amer: 31 mL/min — ABNORMAL LOW (ref >90)
Glucose, Bld: 169 mg/dL — ABNORMAL HIGH (ref 70–99)
Potassium: 3.4 mEq/L — ABNORMAL LOW (ref 3.5–5.1)
Sodium: 140 mEq/L (ref 135–145)

## 2012-08-05 LAB — GLUCOSE, CAPILLARY: Glucose-Capillary: 162 mg/dL — ABNORMAL HIGH (ref 70–99)

## 2012-08-05 MED ORDER — ENOXAPARIN SODIUM 150 MG/ML ~~LOC~~ SOLN
140.0000 mg | Freq: Two times a day (BID) | SUBCUTANEOUS | Status: DC
  Administered 2012-08-05 – 2012-08-08 (×6): 140 mg via SUBCUTANEOUS
  Filled 2012-08-05 (×10): qty 1

## 2012-08-05 MED ORDER — DILTIAZEM HCL 30 MG PO TABS
30.0000 mg | ORAL_TABLET | Freq: Four times a day (QID) | ORAL | Status: DC
  Administered 2012-08-05 – 2012-08-12 (×28): 30 mg via ORAL
  Filled 2012-08-05 (×28): qty 1

## 2012-08-05 MED ORDER — POTASSIUM CHLORIDE CRYS ER 20 MEQ PO TBCR
20.0000 meq | EXTENDED_RELEASE_TABLET | Freq: Every day | ORAL | Status: DC
  Administered 2012-08-05 – 2012-08-10 (×6): 20 meq via ORAL
  Filled 2012-08-05 (×6): qty 1

## 2012-08-05 MED ORDER — BUMETANIDE 0.25 MG/ML IJ SOLN
2.0000 mg | Freq: Two times a day (BID) | INTRAMUSCULAR | Status: DC
  Administered 2012-08-05 – 2012-08-07 (×6): 2 mg via INTRAVENOUS
  Filled 2012-08-05 (×2): qty 4
  Filled 2012-08-05 (×5): qty 8

## 2012-08-05 NOTE — Progress Notes (Signed)
Subjective: She feels about the same. She was complaining of some left lower quadrant abdominal pain last night and says that is better. She has gained weight with changed to torsemide. She has no other new complaints.  Objective: Vital signs in last 24 hours: Temp:  [97.9 F (36.6 C)-98 F (36.7 C)] 97.9 F (36.6 C) (03/12 0622) Resp:  [20] 20 (03/12 0622) BP: (105-108)/(53-55) 105/55 mmHg (03/12 0622) SpO2:  [92 %-98 %] 92 % (03/12 0707) Weight:  [142.112 kg (313 lb 4.8 oz)] 142.112 kg (313 lb 4.8 oz) (03/12 0622) Weight change: 3.992 kg (8 lb 12.8 oz) Last BM Date: 08/04/12  Intake/Output from previous day: 03/11 0701 - 03/12 0700 In: 970 [P.O.:420] Out: 500 [Urine:500]  PHYSICAL EXAM General appearance: alert, cooperative, mild distress and morbidly obese Resp: rhonchi bilaterally Cardio: regular rate and rhythm, S1, S2 normal, no murmur, click, rub or gallop GI: Minimal if any tenderness in the left lower quadrant Extremities: She has 2+ edema in her upper and lower extremities but it actually looks a little bit better than yesterday despite her weight gain  Lab Results:    Basic Metabolic Panel:  Recent Labs  45/40/98 0545 08/05/12 0518  NA 143 140  K 3.8 3.4*  CL 106 104  CO2 25 25  GLUCOSE 139* 169*  BUN 30* 30*  CREATININE 1.52* 1.66*  CALCIUM 8.5 8.4  PHOS 2.7  --    Liver Function Tests:  Recent Labs  08/03/12 0445  AST 14  ALT 17  ALKPHOS 85  BILITOT 0.3  PROT 5.3*  ALBUMIN 2.1*   No results found for this basename: LIPASE, AMYLASE,  in the last 72 hours No results found for this basename: AMMONIA,  in the last 72 hours CBC:  Recent Labs  08/04/12 0545 08/05/12 0518  WBC 13.5* 10.5  HGB 8.6* 8.7*  HCT 28.7* 29.2*  MCV 89.4 89.6  PLT 208 201   Cardiac Enzymes: No results found for this basename: CKTOTAL, CKMB, CKMBINDEX, TROPONINI,  in the last 72 hours BNP: No results found for this basename: PROBNP,  in the last 72  hours D-Dimer: No results found for this basename: DDIMER,  in the last 72 hours CBG:  Recent Labs  08/03/12 2121 08/04/12 0754 08/04/12 1149 08/04/12 1621 08/04/12 2239 08/05/12 0730  GLUCAP 172* 141* 217* 240* 219* 162*   Hemoglobin A1C: No results found for this basename: HGBA1C,  in the last 72 hours Fasting Lipid Panel: No results found for this basename: CHOL, HDL, LDLCALC, TRIG, CHOLHDL, LDLDIRECT,  in the last 72 hours Thyroid Function Tests: No results found for this basename: TSH, T4TOTAL, FREET4, T3FREE, THYROIDAB,  in the last 72 hours Anemia Panel: No results found for this basename: VITAMINB12, FOLATE, FERRITIN, TIBC, IRON, RETICCTPCT,  in the last 72 hours Coagulation: No results found for this basename: LABPROT, INR,  in the last 72 hours Urine Drug Screen: Drugs of Abuse  No results found for this basename: labopia, cocainscrnur, labbenz, amphetmu, thcu, labbarb    Alcohol Level: No results found for this basename: ETH,  in the last 72 hours Urinalysis: No results found for this basename: COLORURINE, APPERANCEUR, LABSPEC, PHURINE, GLUCOSEU, HGBUR, BILIRUBINUR, KETONESUR, PROTEINUR, UROBILINOGEN, NITRITE, LEUKOCYTESUR,  in the last 72 hours Misc. Labs:  ABGS No results found for this basename: PHART, PCO2, PO2ART, TCO2, HCO3,  in the last 72 hours CULTURES Recent Results (from the past 240 hour(s))  URINE CULTURE     Status: None  Collection Time    07/30/12  3:33 PM      Result Value Range Status   Specimen Description URINE, CATHETERIZED   Final   Special Requests NONE   Final   Culture  Setup Time 07/31/2012 01:06   Final   Colony Count NO GROWTH   Final   Culture NO GROWTH   Final   Report Status 07/31/2012 FINAL   Final  CULTURE, BLOOD (ROUTINE X 2)     Status: None   Collection Time    07/30/12  5:31 PM      Result Value Range Status   Specimen Description BLOOD RIGHT HAND   Final   Special Requests BOTTLES DRAWN AEROBIC ONLY 4CC   Final    Culture NO GROWTH 5 DAYS   Final   Report Status 08/04/2012 FINAL   Final  CULTURE, BLOOD (ROUTINE X 2)     Status: None   Collection Time    07/30/12  5:31 PM      Result Value Range Status   Specimen Description BLOOD LEFT ANTECUBITAL   Final   Special Requests BOTTLES DRAWN AEROBIC AND ANAEROBIC 6CC   Final   Culture NO GROWTH 5 DAYS   Final   Report Status 08/04/2012 FINAL   Final  MRSA PCR SCREENING     Status: Abnormal   Collection Time    07/30/12  6:00 PM      Result Value Range Status   MRSA by PCR POSITIVE (*) NEGATIVE Final   Comment:            The GeneXpert MRSA Assay (FDA     approved for NASAL specimens     only), is one component of a     comprehensive MRSA colonization     surveillance program. It is not     intended to diagnose MRSA     infection nor to guide or     monitor treatment for     MRSA infections.     CRITICAL RESULT CALLED TO, READ BACK BY AND VERIFIED WITH:     M. SIMPSON AT 2030 ON 07/30/12 BY S. VANHOORNE   Studies/Results: No results found.  Medications:  Prior to Admission:  Prescriptions prior to admission  Medication Sig Dispense Refill  . albuterol (PROVENTIL HFA;VENTOLIN HFA) 108 (90 BASE) MCG/ACT inhaler Inhale 2 puffs into the lungs 2 (two) times daily. For shortness of breath      . ALPRAZolam (XANAX) 0.5 MG tablet Take 1 tablet (0.5 mg total) by mouth 3 (three) times daily as needed. For anxiety  5 tablet  0  . aspirin EC 81 MG tablet Take 81 mg by mouth daily.      . benzonatate (TESSALON) 100 MG capsule Take 1 capsule (100 mg total) by mouth 3 (three) times daily.  20 capsule  0  . calcitRIOL (ROCALTROL) 0.25 MCG capsule Take 1 capsule (0.25 mcg total) by mouth daily.      . carbamide peroxide (DEBROX) 6.5 % otic solution Place 5 drops into both ears 2 (two) times daily.  15 mL  0  . digoxin (LANOXIN) 0.125 MG tablet Take 1 tablet (0.125 mg total) by mouth daily.  30 tablet  0  . esomeprazole (NEXIUM) 40 MG capsule Take 40 mg by  mouth 2 (two) times daily.       . fluticasone (FLONASE) 50 MCG/ACT nasal spray Place 1 spray into the nose daily.        . Fluticasone-Salmeterol (  ADVAIR) 250-50 MCG/DOSE AEPB Inhale 1 puff into the lungs every 12 (twelve) hours.        . gabapentin (NEURONTIN) 100 MG capsule Take 100 mg by mouth 3 (three) times daily.      Marland Kitchen HYDROcodone-acetaminophen (NORCO/VICODIN) 5-325 MG per tablet Take 1 tablet by mouth every 4 (four) hours as needed.  30 tablet  0  . insulin aspart (NOVOLOG) 100 UNIT/ML injection Inject 10 Units into the skin 3 (three) times daily with meals.  1 vial  0  . insulin detemir (LEVEMIR FLEXPEN) 100 UNIT/ML injection Inject 60 Units into the skin at bedtime.  10 mL  12  . ipratropium (ATROVENT) 0.02 % nebulizer solution Take 2.5 mLs (0.5 mg total) by nebulization every 6 (six) hours.  75 mL  0  . iron polysaccharides (NIFEREX) 150 MG capsule Take 1 capsule (150 mg total) by mouth 2 (two) times daily.  30 capsule  0  . levalbuterol (XOPENEX) 0.63 MG/3ML nebulizer solution Take 3 mLs (0.63 mg total) by nebulization every 6 (six) hours.  3 mL  0  . levothyroxine (SYNTHROID, LEVOTHROID) 200 MCG tablet Take 200 mcg by mouth daily.      Marland Kitchen linagliptin (TRADJENTA) 5 MG TABS tablet Take 5 mg by mouth every morning.       . nitroGLYCERIN (NITROSTAT) 0.4 MG SL tablet Place 1 tablet (0.4 mg total) under the tongue every 5 (five) minutes as needed for chest pain.  25 tablet  3  . polyethylene glycol (MIRALAX / GLYCOLAX) packet Take 17 g by mouth daily.  14 each    . predniSONE (DELTASONE) 5 MG tablet Take 1 tablet (5 mg total) by mouth daily.  30 tablet  0  . Rivaroxaban (XARELTO) 20 MG TABS Take 20 mg by mouth daily.      . rosuvastatin (CRESTOR) 40 MG tablet Take 1 tablet (40 mg total) by mouth at bedtime.  90 tablet  3  . sertraline (ZOLOFT) 50 MG tablet Take 50 mg by mouth daily.      . sodium chloride (OCEAN) 0.65 % SOLN nasal spray Place 1 spray into the nose as needed for congestion.       Marland Kitchen tolterodine (DETROL LA) 4 MG 24 hr capsule Take 4 mg by mouth every morning.       . torsemide (DEMADEX) 20 MG tablet Take 1 tablet (20 mg total) by mouth daily.  30 tablet  0  . vitamin B-12 1000 MCG tablet Take 1 tablet (1,000 mcg total) by mouth daily.  30 tablet  0   Scheduled: . aspirin EC  81 mg Oral Daily  . atorvastatin  80 mg Oral q1800  . calcitRIOL  0.25 mcg Oral Daily  . carbamide peroxide  5 drop Both Ears BID  . enoxaparin (LOVENOX) injection  130 mg Subcutaneous BID  . fesoterodine  8 mg Oral Daily  . fluticasone  1 spray Each Nare Daily  . gabapentin  100 mg Oral TID  . insulin aspart  0-5 Units Subcutaneous QHS  . insulin aspart  0-9 Units Subcutaneous TID WC  . insulin detemir  10 Units Subcutaneous QHS  . levalbuterol  0.63 mg Nebulization Q6H  . levothyroxine  200 mcg Oral Daily  . mometasone-formoterol  2 puff Inhalation BID  . pantoprazole  80 mg Oral Q1200  . polyethylene glycol  17 g Oral Daily  . potassium chloride  20 mEq Oral Daily  . predniSONE  5 mg Oral Daily  .  sertraline  50 mg Oral Daily  . sodium chloride  3 mL Intravenous Q12H  . torsemide  50 mg Oral Daily  . vitamin B-12  1,000 mcg Oral Daily   Continuous:  [id number] injection, ondansetron (ZOFRAN) IV, ondansetron, polyvinyl alcohol  Assesment: She was admitted with sepsis. She has cellulitis of the leg. She is diabetic. All of that appears to be better. She has chronic diastolic heart failure developed acute renal failure and I think she has acute diastolic heart failure as well. Her potassium is slightly low this morning. She is morbidly obese and has gained weight with the change to torsemide. Principal Problem:   Septic shock Active Problems:   HYPOTHYROIDISM   HYPERLIPIDEMIA   HYPERTENSION   CAD   GASTROESOPHAGEAL REFLUX DISEASE   Cellulitis of leg without foot, left   Diabetes mellitus type 2 with complications   Chronic diastolic heart failure   ARF (acute renal  failure)   Chronic anticoagulation   Morbid obesity   Chronic kidney disease, stage III (moderate)   Atrial fibrillation   Hypotension, unspecified   UTI (urinary tract infection)    Plan: I discussed her situation with Dr. Wolfgang Phoenix  nephrology consultant and we're going to switch her to Bumex IV. After our discussion yesterday she has decided she wants to try to go home. I don't think she's ready for that.    LOS: 6 days   HAWKINS,EDWARD L 08/05/2012, 8:30 AM
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
## 2012-08-05 NOTE — Progress Notes (Signed)
Ann [name]  MRN: [id number]  DOB/AGE: 67-Aug-1947 67 y.o.  Primary Care Physician:HAWKINS,EDWARD L, MD  Admit date: 07/30/2012  Chief Complaint:  Chief Complaint  Patient presents with  . Weakness    S-Pt presented on  07/30/2012 with  Chief Complaint  Patient presents with  . Weakness  . Pt offers NO new  Complaints .     Meds . aspirin EC  81 mg Oral Daily  . atorvastatin  80 mg Oral q1800  . bumetanide (BUMEX) IV  2 mg Intravenous Q12H  . calcitRIOL  0.25 mcg Oral Daily  . carbamide peroxide  5 drop Both Ears BID  . diltiazem  30 mg Oral Q6H  . enoxaparin (LOVENOX) injection  130 mg Subcutaneous BID  . fesoterodine  8 mg Oral Daily  . fluticasone  1 spray Each Nare Daily  . gabapentin  100 mg Oral TID  . insulin aspart  0-5 Units Subcutaneous QHS  . insulin aspart  0-9 Units Subcutaneous TID WC  . insulin detemir  10 Units Subcutaneous QHS  . levalbuterol  0.63 mg Nebulization Q6H  . levothyroxine  200 mcg Oral Daily  . mometasone-formoterol  2 puff Inhalation BID  . pantoprazole  80 mg Oral Q1200  . polyethylene glycol  17 g Oral Daily  . potassium chloride  20 mEq Oral Daily  . predniSONE  5 mg Oral Daily  . sertraline  50 mg Oral Daily  . sodium chloride  3 mL Intravenous Q12H  . torsemide  50 mg Oral Daily  . vitamin B-12  1,000 mcg Oral Daily      Physical Exam: Vital signs in last 24 hours: Temp:  [97.9 F (36.6 C)-98 F (36.7 C)] 97.9 F (36.6 C) (03/12 0622) Resp:  [20] 20 (03/12 0622) BP: (105-108)/(53-55) 105/55 mmHg (03/12 0622) SpO2:  [92 %-98 %] 92 % (03/12 0707) Weight:  [313 lb 4.8 oz (142.112 kg)] 313 lb 4.8 oz (142.112 kg) (03/12 0622) Weight change: 8 lb 12.8 oz (3.992 kg) Last BM Date: 08/04/12  Intake/Output from previous day: 03/11 0701 - 03/12 0700 In: 970 [P.O.:420] Out: 500 [Urine:500] Total I/O In: 120 [P.O.:120] Out: -    Physical Exam: General- pt is awake,alert, follows comands Resp- No acute REsp distress,  Decreased BS at bases., Wheezing + CVS- S1S2 regular in rate and rhythm-distant GIT- BS+, soft, NT, ND, Morbidly obese EXT- 2+ LE Edema, NO Cyanosis   Lab Results: CBC  Recent Labs  08/04/12 0545 08/05/12 0518  WBC 13.5* 10.5  HGB 8.6* 8.7*  HCT 28.7* 29.2*  PLT 208 201    BMET  Recent Labs  08/04/12 0545 08/05/12 0518  NA 143 140  K 3.8 3.4*  CL 106 104  CO2 25 25  GLUCOSE 139* 169*  BUN 30* 30*  CREATININE 1.52* 1.66*  CALCIUM 8.5 8.4   Creat Trend 2014 3.62==>2.46==>1.59==>1.5          2.4==>1.04 ( Last admission) 2013 0.8--1.0 2012 0.9--1.13    MICRO Recent Results (from the past 240 hour(s))  URINE CULTURE     Status: None   Collection Time    07/30/12  3:33 PM      Result Value Range Status   Specimen Description URINE, CATHETERIZED   Final   Special Requests NONE   Final   Culture  Setup Time 07/31/2012 01:06   Final   Colony Count NO GROWTH   Final   Culture NO GROWTH   Final  Report Status 07/31/2012 FINAL   Final  CULTURE, BLOOD (ROUTINE X 2)     Status: None   Collection Time    07/30/12  5:31 PM      Result Value Range Status   Specimen Description BLOOD RIGHT HAND   Final   Special Requests BOTTLES DRAWN AEROBIC ONLY 4CC   Final   Culture NO GROWTH 5 DAYS   Final   Report Status 08/04/2012 FINAL   Final  CULTURE, BLOOD (ROUTINE X 2)     Status: None   Collection Time    07/30/12  5:31 PM      Result Value Range Status   Specimen Description BLOOD LEFT ANTECUBITAL   Final   Special Requests BOTTLES DRAWN AEROBIC AND ANAEROBIC 6CC   Final   Culture NO GROWTH 5 DAYS   Final   Report Status 08/04/2012 FINAL   Final  MRSA PCR SCREENING     Status: Abnormal   Collection Time    07/30/12  6:00 PM      Result Value Range Status   MRSA by PCR POSITIVE (*) NEGATIVE Final   Comment:            The GeneXpert MRSA Assay (FDA     approved for NASAL specimens     only), is one component of a     comprehensive MRSA colonization      surveillance program. It is not     intended to diagnose MRSA     infection nor to guide or     monitor treatment for     MRSA infections.     CRITICAL RESULT CALLED TO, READ BACK BY AND VERIFIED WITH:     M. SIMPSON AT 2030 ON 07/30/12 BY Wynonia Lawman      Lab Results  Component Value Date   PTH 177.0* 06/28/2012   CALCIUM 8.4 08/05/2012   PHOS 2.7 08/04/2012       2d echo Left ventricle: The cavity size was moderately reduced. Mild to moderate LVH. Systolic function was hyperdynamic. - Right ventricle: The cavity size was moderately decreased. Wall thickness was increased.   Results for ALYANA, KREITER (MRN [id number]) as of 08/01/2012 10:34  Ref. Range 06/13/2012 14:26  Iron Latest Range: 42-135 ug/dL 20 (L)  UIBC Latest Range: 125-400 ug/dL 621  TIBC Latest Range: 250-470 ug/dL 308  Saturation Ratios Latest Range: 20-55 % 6 (L)  Ferritin Latest Range: 10-291 ng/mL 42  Folate No range found 6.5        Impression: 1)Renal  AKI secondary to Prerenal/ ATn AKI most likely Non Oliguric ATN/Cardiorenal  Creat now at 1.5  2)HTN  Target Organ damage  CAD  Medication- On Diuretics  3)Anemia HGb NOt at goal (9--11) Iron deficincy  GI seen in recent past Pt recevied1 gram IV iron  4)CKD Mineral-Bone Disorder PTH elevated. Secondary Hyperparathyroidism  Present- on calcitriol Phosphorus at goal.   5)ID- Admitted with UTI Urine Culture negative primary team following   6)FEN  Normokalemic NOrmonatremic    7)Acid base Co2 at goal  8) Anasarca-  Pt on PO on Diuretics            Changed to IV BUmex  Plan:  Will d/c PO torsemide as now on Iv Bumex. Will follow I/O. Will follow bmet       BHUTANI,MANPREET S 08/05/2012, 10:36 AM

## 2012-08-05 NOTE — Progress Notes (Signed)
Physical Therapy Treatment Patient Details Name: Ann [name] MRN: [id number] DOB: 1945/11/13 Today's Date: 08/05/2012 Time: 1425-1450 PT Time Calculation (min): 25 min Charges:  theractivity  PT Assessment / Plan / Recommendation Comments on Treatment Session  Pt. agreeable and compliant with therapy participation.  Pt. unable to stand unless bed is elevated due to LE weakness.  Continued to have alot of fluid in all extremities.  Pt with noted fatigue and SOB once ambulated 5 feet to chair and unable to complete therex.     Follow Up Recommendations  SNF           Equipment Recommendations  None recommended by PT    Recommendations for Other Services    Frequency Min 3X/week   Plan      Precautions / Restrictions Precautions Precautions: Fall   Pertinent Vitals/Pain     Mobility  Bed Mobility Bed Mobility: Supine to Sit;Sitting - Scoot to Edge of Bed Supine to Sit: HOB elevated;3: Mod assist Sitting - Scoot to Edge of Bed: 5: Supervision Details for Bed Mobility Assistance: HOB elevated to the max Transfers Transfers: Sit to Stand;Stand to Sit Sit to Stand: From bed;3: Mod assist Stand to Sit: 4: Min guard;To chair/3-in-1 Details for Transfer Assistance: bed needed to be elevated about 6" higher than chair seat in order for pt to stand  Ambulation/Gait Ambulation/Gait Assistance: 4: Min guard Ambulation Distance (Feet): 5 Feet Assistive device: Rolling walker Ambulation/Gait Assistance Details: Pt. too weak to walk further Gait Pattern: Within Functional Limits Gait velocity: slow, cautious Stairs: No Wheelchair Mobility Wheelchair Mobility: No    PT Goals    Visit Information  Last PT Received On: 08/05/12    Subjective Data  Subjective: Pt. states she's gotten progressively weaker.  States she is willing to work with therapy.         End of Session PT - End of Session Equipment Utilized During Treatment: Gait belt Activity Tolerance: Patient  limited by fatigue Patient left: in chair;with call bell/phone within reach;with chair alarm set Nurse Communication: Mobility status     Lurena Nida, PTA/CLT 08/05/2012, 3:48 PM

## 2012-08-06 DIAGNOSIS — I509 Heart failure, unspecified: Secondary | ICD-10-CM | POA: Diagnosis not present

## 2012-08-06 DIAGNOSIS — D649 Anemia, unspecified: Secondary | ICD-10-CM | POA: Diagnosis not present

## 2012-08-06 DIAGNOSIS — R601 Generalized edema: Secondary | ICD-10-CM | POA: Diagnosis present

## 2012-08-06 DIAGNOSIS — J449 Chronic obstructive pulmonary disease, unspecified: Secondary | ICD-10-CM | POA: Diagnosis not present

## 2012-08-06 DIAGNOSIS — I5033 Acute on chronic diastolic (congestive) heart failure: Secondary | ICD-10-CM | POA: Diagnosis present

## 2012-08-06 LAB — BASIC METABOLIC PANEL
GFR calc non Af Amer: 39 mL/min — ABNORMAL LOW (ref >90)
Glucose, Bld: 173 mg/dL — ABNORMAL HIGH (ref 70–99)
Potassium: 3.5 mEq/L (ref 3.5–5.1)
Sodium: 141 mEq/L (ref 135–145)

## 2012-08-06 LAB — GLUCOSE, CAPILLARY
Glucose-Capillary: 217 mg/dL — ABNORMAL HIGH (ref 70–99)
Glucose-Capillary: 304 mg/dL — ABNORMAL HIGH (ref 70–99)

## 2012-08-06 MED ORDER — TORSEMIDE 20 MG PO TABS
50.0000 mg | ORAL_TABLET | Freq: Every day | ORAL | Status: DC
  Administered 2012-08-06 – 2012-08-12 (×7): 50 mg via ORAL
  Filled 2012-08-06 (×6): qty 3
  Filled 2012-08-06: qty 1
  Filled 2012-08-06: qty 3

## 2012-08-06 NOTE — Plan of Care (Signed)
Problem: Phase I Progression Outcomes Goal: Voiding-avoid urinary catheter unless indicated Outcome: Not Met (add Reason) Patient has foley in place for urinary retention and is on high dose diuretics.

## 2012-08-06 NOTE — Progress Notes (Signed)
Physical Therapy Treatment Patient Details Name: Ann [name] MRN: [id number] DOB: May 03, 1946 Today's Date: 08/06/2012 Time: 8295-6213 PT Time Calculation (min): 43 min  PT Assessment / Plan / Recommendation Comments on Treatment Session  Pt tolerated PT well and was very cooperative.  She has been expressing the feeling that she would like to go home at d/c and we had a lengthy discussion as to the practicality of this.  She  wants to go home, but at the same time does not want for her husband to be burdened by her care.  She has decided that she does want to go to SNF in order to develop max functional independence.  Overall strength is slightly improved today and as her body edema decreases, her mobility should improve.    Follow Up Recommendations        Does the patient have the potential to tolerate intense rehabilitation     Barriers to Discharge        Equipment Recommendations       Recommendations for Other Services    Frequency     Plan Discharge plan remains appropriate;Frequency remains appropriate    Precautions / Restrictions Restrictions Weight Bearing Restrictions: No   Pertinent Vitals/Pain     Mobility  Bed Mobility Bed Mobility: Supine to Sit Supine to Sit: 4: Min assist;HOB elevated Sitting - Scoot to Edge of Bed: Not tested (comment) Transfers Sit to Stand: 4: Min guard;From bed Stand to Sit: 5: Supervision;To bed Details for Transfer Assistance: bed not elevated much above height of recliner chair...sit to stand done x 6 reps to walker.Marland KitchenMarland Kitchenpt was instructed in proper anterior weight shift with teach back method employed Ambulation/Gait Ambulation/Gait Assistance: 4: Min guard Ambulation Distance (Feet): 8 Feet Assistive device: Rolling walker Gait Pattern: Within Functional Limits Gait velocity: slow and cautious Stairs: No Wheelchair Mobility Wheelchair Mobility: No    Exercises General Exercises - Lower Extremity Long Arc Quad: AROM;Both;10  reps;Seated Hip Flexion/Marching: AROM;Both;15 reps;Seated Other Exercises Other Exercises: sit to stand x 6 repetitions   PT Diagnosis:    PT Problem List:   PT Treatment Interventions:     PT Goals Acute Rehab PT Goals PT Goal: Supine/Side to Sit - Progress: Progressing toward goal PT Goal: Sit to Stand - Progress: Progressing toward goal PT Transfer Goal: Bed to Chair/Chair to Bed - Progress: Progressing toward goal PT Goal: Ambulate - Progress: Goal set today  Visit Information  Last PT Received On: 08/06/12    Subjective Data  Subjective: Feels a bit better   Cognition       Balance     End of Session PT - End of Session Equipment Utilized During Treatment: Gait belt Activity Tolerance: Patient limited by fatigue Patient left: in chair;with family/visitor present;with call bell/phone within reach Nurse Communication: Mobility status   GP     Myrlene Broker L 08/06/2012, 1:20 PM

## 2012-08-06 NOTE — Clinical Social Work Placement (Signed)
Clinical Social Work Department CLINICAL SOCIAL WORK PLACEMENT NOTE 08/06/2012  Patient:  Ann [name], Ann [name]  Account Number:  [id number] Admit date:  07/30/2012  Clinical Social Worker:  Derenda Fennel, LCSW  Date/time:  08/06/2012 02:20 PM  Clinical Social Work is seeking post-discharge placement for this patient at the following level of care:   SKILLED NURSING   (*CSW will update this form in Epic as items are completed)   08/06/2012  Patient/family provided with [facility] Department of Clinical Social Work's list of facilities offering this level of care within the geographic area requested by the patient (or if unable, by the patient's family).  08/06/2012  Patient/family informed of their freedom to choose among providers that offer the needed level of care, that participate in Medicare, Medicaid or managed care program needed by the patient, have an available bed and are willing to accept the patient.  08/06/2012  Patient/family informed of [facility]' ownership interest in [facility], as well as of the fact that they are under no obligation to receive care at this facility.  PASARR submitted to EDS on  PASARR number received from EDS on   FL2 transmitted to all facilities in geographic area requested by pt/family on  08/06/2012 FL2 transmitted to all facilities within larger geographic area on   Patient informed that his/her managed care company has contracts with or will negotiate with  certain facilities, including the following:     Patient/family informed of bed offers received:  08/06/2012 Patient chooses bed at [facility] Physician recommends and patient chooses bed at  [facility]  Patient to be transferred to  on   Patient to be transferred to facility by   The following physician request were entered in Epic:   Additional Comments: existing pasarr.  Derenda Fennel, [place] [phone]

## 2012-08-06 NOTE — Clinical Social Work Note (Signed)
Pt is now agreeable to SNF, but only at [facility]. CSW sent referral to [facility] and they are offering bed to pt which she accepts. She is aware of Medicare coverage/criteria. Awaiting stability for d/c.  Derenda Fennel, LCSW [phone]

## 2012-08-06 NOTE — Progress Notes (Signed)
Inpatient Diabetes Program Recommendations  AACE/ADA: New Consensus Statement on Inpatient Glycemic Control (2013)  Target Ranges:  Prepandial:   less than 140 mg/dL      Peak postprandial:   less than 180 mg/dL (1-2 hours)      Critically ill patients:  140 - 180 mg/dL   Patient's post-prandial cbg's are elevated. The patient takes 10 units Novolog tidwc and Tradjenta 5 mg at home. Please add meal coverage of 5 units tidwc to start here as well as Tradjenta 5 mg. Meal coverage is given only if patient eats at least 50% of meal. Pt's HgbA1C is shows good control at 6.5%  Thank you, Lenor Coffin, RN, CNS, Diabetes Coordinator [phone])

## 2012-08-06 NOTE — Progress Notes (Signed)
Subjective: Interval History: has no complaint of nausea or vomiting. Presently patient says that she's feeling better. She has less cough and also less difficulty in breathing..  Objective: Vital signs in last 24 hours: Temp:  [98 F (36.7 C)] 98 F (36.7 C) (03/13 0641) Pulse Rate:  [76] 76 (03/13 0641) Resp:  [20] 20 (03/13 0641) BP: (95)/(60) 95/60 mmHg (03/13 0641) SpO2:  [88 %-95 %] 95 % (03/13 0719) Weight:  [140.5 kg (309 lb 11.9 oz)] 140.5 kg (309 lb 11.9 oz) (03/13 0641) Weight change: -1.612 kg (-3 lb 8.9 oz)  Intake/Output from previous day: 03/12 0701 - 03/13 0700 In: 480 [P.O.:480] Out: 2250 [Urine:2250] Intake/Output this shift:    General appearance: alert, cooperative and no distress Resp: rales bilaterally Cardio: regular rate and rhythm, S1, S2 normal, no murmur, click, rub or gallop GI: soft, non-tender; bowel sounds normal; no masses,  no organomegaly Extremities: edema 2-3+ edema  Lab Results:  Recent Labs  08/04/12 0545 08/05/12 0518  WBC 13.5* 10.5  HGB 8.6* 8.7*  HCT 28.7* 29.2*  PLT 208 201   BMET:  Recent Labs  08/05/12 0518 08/06/12 0454  NA 140 141  K 3.4* 3.5  CL 104 105  CO2 25 26  GLUCOSE 169* 173*  BUN 30* 28*  CREATININE 1.66* 1.39*  CALCIUM 8.4 8.4   No results found for this basename: PTH,  in the last 72 hours Iron Studies: No results found for this basename: IRON, TIBC, TRANSFERRIN, FERRITIN,  in the last 72 hours  Studies/Results: No results found.  I have reviewed the patient's current medications.  Assessment/Plan: Problem #1 renal failure chronic her BUN is 28 and creatinine is 1.39 renal function is improving. Presently it has returned to her baseline. Problem #2 anasarca patient on diuretics could urine output but still she has significant fluid overload. Problem #3 hypokalemia that has improved Problem #4 hypertension blood pressure seems to be reasonably controlled Problem #5 catheterization Problem #6  history of hypothyroidism she is on Synthroid Problem #7 adrenal insufficiency Problem #8 diabetes.  Plan: We'll add Demadex 60 mg by mouth once a day We'll continue his other medications as before.   LOS: 7 days   BEFEKADU,BELAYENH S 08/06/2012,7:28 AM

## 2012-08-06 NOTE — Progress Notes (Signed)
Subjective: She says she's a little bit better. She is less short of breath. She had good diuresis and has lost weight.  Objective: Vital signs in last 24 hours: Temp:  [98 F (36.7 C)] 98 F (36.7 C) (03/13 0641) Pulse Rate:  [76] 76 (03/13 0641) Resp:  [20] 20 (03/13 0641) BP: (95)/(60) 95/60 mmHg (03/13 0641) SpO2:  [88 %-95 %] 95 % (03/13 0719) Weight:  [140.5 kg (309 lb 11.9 oz)] 140.5 kg (309 lb 11.9 oz) (03/13 0641) Weight change: -1.612 kg (-3 lb 8.9 oz) Last BM Date: 08/05/12  Intake/Output from previous day: 03/12 0701 - 03/13 0700 In: 480 [P.O.:480] Out: 2250 [Urine:2250]  PHYSICAL EXAM General appearance: alert, cooperative, mild distress and morbidly obese Resp: rhonchi bilaterally Cardio: regular rate and rhythm, S1, S2 normal, no murmur, click, rub or gallop GI: Although she still complains of left lower quadrant pain she is not tender in her bowel sounds are active Extremities: She has edema in her legs which is 2-3+ and still in her arms and hand.  Lab Results:    Basic Metabolic Panel:  Recent Labs  16/10/96 0545 08/05/12 0518 08/06/12 0454  NA 143 140 141  K 3.8 3.4* 3.5  CL 106 104 105  CO2 25 25 26   GLUCOSE 139* 169* 173*  BUN 30* 30* 28*  CREATININE 1.52* 1.66* 1.39*  CALCIUM 8.5 8.4 8.4  PHOS 2.7  --   --    Liver Function Tests: No results found for this basename: AST, ALT, ALKPHOS, BILITOT, PROT, ALBUMIN,  in the last 72 hours No results found for this basename: LIPASE, AMYLASE,  in the last 72 hours No results found for this basename: AMMONIA,  in the last 72 hours CBC:  Recent Labs  08/04/12 0545 08/05/12 0518  WBC 13.5* 10.5  HGB 8.6* 8.7*  HCT 28.7* 29.2*  MCV 89.4 89.6  PLT 208 201   Cardiac Enzymes: No results found for this basename: CKTOTAL, CKMB, CKMBINDEX, TROPONINI,  in the last 72 hours BNP: No results found for this basename: PROBNP,  in the last 72 hours D-Dimer: No results found for this basename: DDIMER,   in the last 72 hours CBG:  Recent Labs  08/04/12 2239 08/05/12 0730 08/05/12 1157 08/05/12 1633 08/05/12 2126 08/06/12 0806  GLUCAP 219* 162* 210* 303* 240* 152*   Hemoglobin A1C: No results found for this basename: HGBA1C,  in the last 72 hours Fasting Lipid Panel: No results found for this basename: CHOL, HDL, LDLCALC, TRIG, CHOLHDL, LDLDIRECT,  in the last 72 hours Thyroid Function Tests: No results found for this basename: TSH, T4TOTAL, FREET4, T3FREE, THYROIDAB,  in the last 72 hours Anemia Panel: No results found for this basename: VITAMINB12, FOLATE, FERRITIN, TIBC, IRON, RETICCTPCT,  in the last 72 hours Coagulation: No results found for this basename: LABPROT, INR,  in the last 72 hours Urine Drug Screen: Drugs of Abuse  No results found for this basename: labopia, cocainscrnur, labbenz, amphetmu, thcu, labbarb    Alcohol Level: No results found for this basename: ETH,  in the last 72 hours Urinalysis: No results found for this basename: COLORURINE, APPERANCEUR, LABSPEC, PHURINE, GLUCOSEU, HGBUR, BILIRUBINUR, KETONESUR, PROTEINUR, UROBILINOGEN, NITRITE, LEUKOCYTESUR,  in the last 72 hours Misc. Labs:  ABGS No results found for this basename: PHART, PCO2, PO2ART, TCO2, HCO3,  in the last 72 hours CULTURES Recent Results (from the past 240 hour(s))  URINE CULTURE     Status: None   Collection Time  07/30/12  3:33 PM      Result Value Range Status   Specimen Description URINE, CATHETERIZED   Final   Special Requests NONE   Final   Culture  Setup Time 07/31/2012 01:06   Final   Colony Count NO GROWTH   Final   Culture NO GROWTH   Final   Report Status 07/31/2012 FINAL   Final  CULTURE, BLOOD (ROUTINE X 2)     Status: None   Collection Time    07/30/12  5:31 PM      Result Value Range Status   Specimen Description BLOOD RIGHT HAND   Final   Special Requests BOTTLES DRAWN AEROBIC ONLY 4CC   Final   Culture NO GROWTH 5 DAYS   Final   Report Status 08/04/2012  FINAL   Final  CULTURE, BLOOD (ROUTINE X 2)     Status: None   Collection Time    07/30/12  5:31 PM      Result Value Range Status   Specimen Description BLOOD LEFT ANTECUBITAL   Final   Special Requests BOTTLES DRAWN AEROBIC AND ANAEROBIC 6CC   Final   Culture NO GROWTH 5 DAYS   Final   Report Status 08/04/2012 FINAL   Final  MRSA PCR SCREENING     Status: Abnormal   Collection Time    07/30/12  6:00 PM      Result Value Range Status   MRSA by PCR POSITIVE (*) NEGATIVE Final   Comment:            The GeneXpert MRSA Assay (FDA     approved for NASAL specimens     only), is one component of a     comprehensive MRSA colonization     surveillance program. It is not     intended to diagnose MRSA     infection nor to guide or     monitor treatment for     MRSA infections.     CRITICAL RESULT CALLED TO, READ BACK BY AND VERIFIED WITH:     M. SIMPSON AT 2030 ON 07/30/12 BY S. VANHOORNE   Studies/Results: No results found.  Medications:  Prior to Admission:  Prescriptions prior to admission  Medication Sig Dispense Refill  . albuterol (PROVENTIL HFA;VENTOLIN HFA) 108 (90 BASE) MCG/ACT inhaler Inhale 2 puffs into the lungs 2 (two) times daily. For shortness of breath      . ALPRAZolam (XANAX) 0.5 MG tablet Take 1 tablet (0.5 mg total) by mouth 3 (three) times daily as needed. For anxiety  5 tablet  0  . aspirin EC 81 MG tablet Take 81 mg by mouth daily.      . benzonatate (TESSALON) 100 MG capsule Take 1 capsule (100 mg total) by mouth 3 (three) times daily.  20 capsule  0  . calcitRIOL (ROCALTROL) 0.25 MCG capsule Take 1 capsule (0.25 mcg total) by mouth daily.      . carbamide peroxide (DEBROX) 6.5 % otic solution Place 5 drops into both ears 2 (two) times daily.  15 mL  0  . digoxin (LANOXIN) 0.125 MG tablet Take 1 tablet (0.125 mg total) by mouth daily.  30 tablet  0  . esomeprazole (NEXIUM) 40 MG capsule Take 40 mg by mouth 2 (two) times daily.       . fluticasone (FLONASE) 50  MCG/ACT nasal spray Place 1 spray into the nose daily.        . Fluticasone-Salmeterol (ADVAIR) 250-50 MCG/DOSE AEPB Inhale  1 puff into the lungs every 12 (twelve) hours.        . gabapentin (NEURONTIN) 100 MG capsule Take 100 mg by mouth 3 (three) times daily.      Marland Kitchen HYDROcodone-acetaminophen (NORCO/VICODIN) 5-325 MG per tablet Take 1 tablet by mouth every 4 (four) hours as needed.  30 tablet  0  . insulin aspart (NOVOLOG) 100 UNIT/ML injection Inject 10 Units into the skin 3 (three) times daily with meals.  1 vial  0  . insulin detemir (LEVEMIR FLEXPEN) 100 UNIT/ML injection Inject 60 Units into the skin at bedtime.  10 mL  12  . ipratropium (ATROVENT) 0.02 % nebulizer solution Take 2.5 mLs (0.5 mg total) by nebulization every 6 (six) hours.  75 mL  0  . iron polysaccharides (NIFEREX) 150 MG capsule Take 1 capsule (150 mg total) by mouth 2 (two) times daily.  30 capsule  0  . levalbuterol (XOPENEX) 0.63 MG/3ML nebulizer solution Take 3 mLs (0.63 mg total) by nebulization every 6 (six) hours.  3 mL  0  . levothyroxine (SYNTHROID, LEVOTHROID) 200 MCG tablet Take 200 mcg by mouth daily.      Marland Kitchen linagliptin (TRADJENTA) 5 MG TABS tablet Take 5 mg by mouth every morning.       . nitroGLYCERIN (NITROSTAT) 0.4 MG SL tablet Place 1 tablet (0.4 mg total) under the tongue every 5 (five) minutes as needed for chest pain.  25 tablet  3  . polyethylene glycol (MIRALAX / GLYCOLAX) packet Take 17 g by mouth daily.  14 each    . predniSONE (DELTASONE) 5 MG tablet Take 1 tablet (5 mg total) by mouth daily.  30 tablet  0  . Rivaroxaban (XARELTO) 20 MG TABS Take 20 mg by mouth daily.      . rosuvastatin (CRESTOR) 40 MG tablet Take 1 tablet (40 mg total) by mouth at bedtime.  90 tablet  3  . sertraline (ZOLOFT) 50 MG tablet Take 50 mg by mouth daily.      . sodium chloride (OCEAN) 0.65 % SOLN nasal spray Place 1 spray into the nose as needed for congestion.      Marland Kitchen tolterodine (DETROL LA) 4 MG 24 hr capsule Take 4 mg  by mouth every morning.       . torsemide (DEMADEX) 20 MG tablet Take 1 tablet (20 mg total) by mouth daily.  30 tablet  0  . vitamin B-12 1000 MCG tablet Take 1 tablet (1,000 mcg total) by mouth daily.  30 tablet  0   Scheduled: . aspirin EC  81 mg Oral Daily  . atorvastatin  80 mg Oral q1800  . bumetanide (BUMEX) IV  2 mg Intravenous Q12H  . calcitRIOL  0.25 mcg Oral Daily  . carbamide peroxide  5 drop Both Ears BID  . diltiazem  30 mg Oral Q6H  . enoxaparin (LOVENOX) injection  140 mg Subcutaneous BID  . fesoterodine  8 mg Oral Daily  . fluticasone  1 spray Each Nare Daily  . gabapentin  100 mg Oral TID  . insulin aspart  0-5 Units Subcutaneous QHS  . insulin aspart  0-9 Units Subcutaneous TID WC  . insulin detemir  10 Units Subcutaneous QHS  . levalbuterol  0.63 mg Nebulization Q6H  . levothyroxine  200 mcg Oral Daily  . mometasone-formoterol  2 puff Inhalation BID  . pantoprazole  80 mg Oral Q1200  . polyethylene glycol  17 g Oral Daily  . potassium chloride  20  mEq Oral Daily  . predniSONE  5 mg Oral Daily  . sertraline  50 mg Oral Daily  . sodium chloride  3 mL Intravenous Q12H  . torsemide  50 mg Oral Daily  . vitamin B-12  1,000 mcg Oral Daily   Continuous:  AVW:[id number] injection, ondansetron (ZOFRAN) IV, ondansetron, polyvinyl alcohol  Assesment: She has anasarca. She has acute on chronic diastolic heart failure. She has acute renal failure. She was admitted with sepsis. She is overall improving but still very sick Principal Problem:   Septic shock Active Problems:   HYPOTHYROIDISM   HYPERLIPIDEMIA   HYPERTENSION   CAD   GASTROESOPHAGEAL REFLUX DISEASE   Cellulitis of leg without foot, left   Diabetes mellitus type 2 with complications   Chronic diastolic heart failure   Adrenal insufficiency   ARF (acute renal failure)   Chronic anticoagulation   Morbid obesity   Chronic kidney disease, stage III (moderate)   Atrial fibrillation   Hypotension,  unspecified   UTI (urinary tract infection)    Plan: She will continue on IV Bumex. I think she's going to need at least another 48 hours or so of this and then we can discuss discharge plans. Her family plans to take her home. I discussed we have placement with the patient and family which I think is a more appropriate discharge plan but I don't think they will agree    LOS: 7 days   HAWKINS,EDWARD L 08/06/2012, 8:56 AM

## 2012-08-07 DIAGNOSIS — I509 Heart failure, unspecified: Secondary | ICD-10-CM | POA: Diagnosis not present

## 2012-08-07 DIAGNOSIS — N179 Acute kidney failure, unspecified: Secondary | ICD-10-CM | POA: Diagnosis not present

## 2012-08-07 LAB — BASIC METABOLIC PANEL
Calcium: 8.6 mg/dL (ref 8.4–10.5)
GFR calc non Af Amer: 41 mL/min — ABNORMAL LOW (ref >90)
Sodium: 141 mEq/L (ref 135–145)

## 2012-08-07 LAB — CBC
MCH: 27.3 pg (ref 26.0–34.0)
MCHC: 30.3 g/dL (ref 30.0–36.0)
Platelets: 178 10*3/uL (ref 150–400)

## 2012-08-07 LAB — GLUCOSE, CAPILLARY: Glucose-Capillary: 321 mg/dL — ABNORMAL HIGH (ref 70–99)

## 2012-08-07 NOTE — Progress Notes (Signed)
Physical Therapy Treatment Patient Details Name: Ann [name] MRN: [id number] DOB: 09-09-1945 Today's Date: 08/07/2012 Time: 1478-2956 PT Time Calculation (min): 21 min  PT Assessment / Plan / Recommendation Comments on Treatment Session  Pt with nausea and general malaise...only able to tolerate ther ex in the bed.    Follow Up Recommendations        Does the patient have the potential to tolerate intense rehabilitation     Barriers to Discharge        Equipment Recommendations       Recommendations for Other Services    Frequency     Plan Discharge plan remains appropriate;Frequency remains appropriate    Precautions / Restrictions     Pertinent Vitals/Pain     Mobility  Bed Mobility Details for Bed Mobility Assistance: pt declined getting OOB due to nausea.Marland KitchenMarland KitchenRN notified of nausea    Exercises General Exercises - Lower Extremity Ankle Circles/Pumps: AROM;Both;10 reps;Supine Quad Sets: AROM;Both;10 reps;Supine Gluteal Sets: AROM;Both;10 reps;Supine Short Arc Quad: AROM;Both;10 reps;Supine Heel Slides: AROM;Both;10 reps;Supine Hip ABduction/ADduction: AROM;Both;10 reps;Supine Straight Leg Raises: AROM;Both;10 reps;Supine   PT Diagnosis:    PT Problem List:   PT Treatment Interventions:     PT Goals Acute Rehab PT Goals PT Goal: Supine/Side to Sit - Progress: Not progressing PT Goal: Sit to Stand - Progress: Not progressing PT Transfer Goal: Bed to Chair/Chair to Bed - Progress: Not progressing PT Goal: Ambulate - Progress: Not progressing  Visit Information  Last PT Received On: 08/07/12    Subjective Data  Subjective: c/o nausea, general malaise   Cognition       Balance     End of Session PT - End of Session Activity Tolerance:  (limited by nausea) Patient left: in bed;with call bell/phone within reach Nurse Communication: Mobility status   GP     Konrad Penta 08/07/2012, 12:09 PM
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
## 2012-08-07 NOTE — Clinical Social Work Note (Signed)
Possible d/c to [facility] tomorrow. Facility aware and agreeable.  Derenda Fennel, [place] [phone]

## 2012-08-07 NOTE — Progress Notes (Signed)
Patients foley leaking a great amount - removed. Patient was becoming soiled.  Note left for MD and foley left out at this time d/t anticipation of discharge tomorrow to [facility]

## 2012-08-07 NOTE — Progress Notes (Addendum)
Subjective: Interval History: has no complaint of nausea or vomiting. Presently patient is still has cough nonproductive. She denies any difficulty breathing..  Objective: Vital signs in last 24 hours: Temp:  [97.5 F (36.4 C)-98.9 F (37.2 C)] 97.8 F (36.6 C) (03/14 0611) Pulse Rate:  [75-86] 86 (03/13 2101) Resp:  [20] 20 (03/14 0611) BP: (108-138)/(56-75) 110/60 mmHg (03/14 0611) SpO2:  [93 %-95 %] 93 % (03/14 0707) Weight change:   Intake/Output from previous day: 03/13 0701 - 03/14 0700 In: 480 [P.O.:480] Out: 1450 [Urine:1450] Intake/Output this shift:    General appearance: alert, cooperative and no distress Resp: diminished breath sounds bilaterally and rhonchi posterior - bilateral Cardio: regular rate and rhythm, S1, S2 normal, no murmur, click, rub or gallop GI: soft, non-tender; bowel sounds normal; no masses,  no organomegaly Extremities: edema  2 to 3+ edema  Lab Results:  Recent Labs  08/05/12 0518 08/07/12 0458  WBC 10.5 10.1  HGB 8.7* 8.7*  HCT 29.2* 28.7*  PLT 201 178   BMET:  Recent Labs  08/06/12 0454 08/07/12 0458  NA 141 141  K 3.5 3.7  CL 105 103  CO2 26 27  GLUCOSE 173* 220*  BUN 28* 24*  CREATININE 1.39* 1.31*  CALCIUM 8.4 8.6   No results found for this basename: PTH,  in the last 72 hours Iron Studies: No results found for this basename: IRON, TIBC, TRANSFERRIN, FERRITIN,  in the last 72 hours  Studies/Results: No results found.  I have reviewed the patient's current medications.  Assessment/Plan: Problem #1 her renal failure chronic penicillins 4 creatinine is 1.31 stable. Her potassium is 3.7. Problem #2 anasarca she is on diuretics reasonable urine output but still patient with significant fluid overload. Problem #3 hypothyroidism Problem #4 hypertension her blood pressure seems to be reasonably controlled Problem #5 adrenal insufficiency Problem #6 diabetes Problem #7 metabolic bone disease calcium and phosphorus was  in acceptable range. Plan: Change Demadex to 100 mg po once a day Basic metabolic panel in the morning.   LOS: 8 days   BEFEKADU,BELAYENH S 08/07/2012,7:54 AM

## 2012-08-08 ENCOUNTER — Inpatient Hospital Stay (HOSPITAL_COMMUNITY): Payer: Medicare Other

## 2012-08-08 DIAGNOSIS — I509 Heart failure, unspecified: Secondary | ICD-10-CM | POA: Diagnosis not present

## 2012-08-08 DIAGNOSIS — N179 Acute kidney failure, unspecified: Secondary | ICD-10-CM | POA: Diagnosis not present

## 2012-08-08 DIAGNOSIS — K5732 Diverticulitis of large intestine without perforation or abscess without bleeding: Secondary | ICD-10-CM | POA: Diagnosis not present

## 2012-08-08 LAB — GLUCOSE, CAPILLARY
Glucose-Capillary: 151 mg/dL — ABNORMAL HIGH (ref 70–99)
Glucose-Capillary: 248 mg/dL — ABNORMAL HIGH (ref 70–99)
Glucose-Capillary: 216 mg/dL — ABNORMAL HIGH (ref 70–99)

## 2012-08-08 MED ORDER — CIPROFLOXACIN IN D5W 200 MG/100ML IV SOLN
200.0000 mg | Freq: Two times a day (BID) | INTRAVENOUS | Status: DC
  Administered 2012-08-09 – 2012-08-11 (×7): 200 mg via INTRAVENOUS
  Filled 2012-08-08 (×8): qty 100

## 2012-08-08 MED ORDER — LEVALBUTEROL HCL 0.63 MG/3ML IN NEBU
0.6300 mg | INHALATION_SOLUTION | RESPIRATORY_TRACT | Status: DC | PRN
  Administered 2012-08-10: 0.63 mg via RESPIRATORY_TRACT
  Filled 2012-08-08 (×2): qty 3

## 2012-08-08 MED ORDER — ENOXAPARIN SODIUM 120 MG/0.8ML ~~LOC~~ SOLN
120.0000 mg | Freq: Two times a day (BID) | SUBCUTANEOUS | Status: DC
  Administered 2012-08-08 – 2012-08-12 (×8): 120 mg via SUBCUTANEOUS
  Filled 2012-08-08 (×8): qty 0.8

## 2012-08-08 MED ORDER — FUROSEMIDE 10 MG/ML IJ SOLN
160.0000 mg | Freq: Two times a day (BID) | INTRAVENOUS | Status: DC
  Administered 2012-08-08 – 2012-08-10 (×6): 160 mg via INTRAVENOUS
  Filled 2012-08-08 (×7): qty 16

## 2012-08-08 MED ORDER — METRONIDAZOLE IN NACL 5-0.79 MG/ML-% IV SOLN
500.0000 mg | Freq: Three times a day (TID) | INTRAVENOUS | Status: DC
  Administered 2012-08-09 – 2012-08-12 (×11): 500 mg via INTRAVENOUS
  Filled 2012-08-08 (×12): qty 100

## 2012-08-08 MED ORDER — CIPROFLOXACIN IN D5W 400 MG/200ML IV SOLN
400.0000 mg | Freq: Two times a day (BID) | INTRAVENOUS | Status: DC
  Filled 2012-08-08 (×4): qty 200

## 2012-08-08 MED ORDER — IOHEXOL 300 MG/ML  SOLN: 50.0000 mL | Freq: Once | INTRAMUSCULAR | Status: AC | PRN
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
## 2012-08-08 NOTE — Plan of Care (Signed)
Problem: Phase II Progression Outcomes Goal: Discharge plan established Outcome: Completed/Met Date Met:  08/08/12 Patient to DC to [name] nursing center

## 2012-08-08 NOTE — Progress Notes (Signed)
Subjective: She says she's much more short of breath. She is coughing. She vomited last night and early this morning. She still complaining of abdominal pain. She's having more difficulty getting up.  Objective: Vital signs in last 24 hours: Temp:  [97.9 F (36.6 C)-98.4 F (36.9 C)] 98.4 F (36.9 C) (03/15 0616) Pulse Rate:  [80-84] 80 (03/15 0616) Resp:  [20] 20 (03/15 0616) BP: (111-125)/(45-72) 119/72 mmHg (03/15 0616) SpO2:  [92 %-96 %] 95 % (03/15 0708) Weight change:  Last BM Date: 08/07/12  Intake/Output from previous day: 03/14 0701 - 03/15 0700 In: 723 [P.O.:720; I.V.:3] Out: 1225 [Urine:1225]  PHYSICAL EXAM General appearance: alert, cooperative and morbidly obese Resp: rhonchi bilaterally Cardio: regular rate and rhythm, S1, S2 normal, no murmur, click, rub or gallop GI: soft, non-tender; bowel sounds normal; no masses,  no organomegaly Extremities: She still has marked bilateral upper and lower extremity edema  Lab Results:    Basic Metabolic Panel:  Recent Labs  21/30/86 0454 08/07/12 0458  NA 141 141  K 3.5 3.7  CL 105 103  CO2 26 27  GLUCOSE 173* 220*  BUN 28* 24*  CREATININE 1.39* 1.31*  CALCIUM 8.4 8.6   Liver Function Tests: No results found for this basename: AST, ALT, ALKPHOS, BILITOT, PROT, ALBUMIN,  in the last 72 hours No results found for this basename: LIPASE, AMYLASE,  in the last 72 hours No results found for this basename: AMMONIA,  in the last 72 hours CBC:  Recent Labs  08/07/12 0458  WBC 10.1  HGB 8.7*  HCT 28.7*  MCV 90.0  PLT 178   Cardiac Enzymes: No results found for this basename: CKTOTAL, CKMB, CKMBINDEX, TROPONINI,  in the last 72 hours BNP: No results found for this basename: PROBNP,  in the last 72 hours D-Dimer: No results found for this basename: DDIMER,  in the last 72 hours CBG:  Recent Labs  08/06/12 2059 08/07/12 0722 08/07/12 1110 08/07/12 1616 08/07/12 2145 08/08/12 0714  GLUCAP 304* 192*  229* 321* 268* 151*   Hemoglobin A1C: No results found for this basename: HGBA1C,  in the last 72 hours Fasting Lipid Panel: No results found for this basename: CHOL, HDL, LDLCALC, TRIG, CHOLHDL, LDLDIRECT,  in the last 72 hours Thyroid Function Tests: No results found for this basename: TSH, T4TOTAL, FREET4, T3FREE, THYROIDAB,  in the last 72 hours Anemia Panel: No results found for this basename: VITAMINB12, FOLATE, FERRITIN, TIBC, IRON, RETICCTPCT,  in the last 72 hours Coagulation: No results found for this basename: LABPROT, INR,  in the last 72 hours Urine Drug Screen: Drugs of Abuse  No results found for this basename: labopia, cocainscrnur, labbenz, amphetmu, thcu, labbarb    Alcohol Level: No results found for this basename: ETH,  in the last 72 hours Urinalysis: No results found for this basename: COLORURINE, APPERANCEUR, LABSPEC, PHURINE, GLUCOSEU, HGBUR, BILIRUBINUR, KETONESUR, PROTEINUR, UROBILINOGEN, NITRITE, LEUKOCYTESUR,  in the last 72 hours Misc. Labs:  ABGS No results found for this basename: PHART, PCO2, PO2ART, TCO2, HCO3,  in the last 72 hours CULTURES Recent Results (from the past 240 hour(s))  URINE CULTURE     Status: None   Collection Time    07/30/12  3:33 PM      Result Value Range Status   Specimen Description URINE, CATHETERIZED   Final   Special Requests NONE   Final   Culture  Setup Time 07/31/2012 01:06   Final   Colony Count NO GROWTH  Final   Culture NO GROWTH   Final   Report Status 07/31/2012 FINAL   Final  CULTURE, BLOOD (ROUTINE X 2)     Status: None   Collection Time    07/30/12  5:31 PM      Result Value Range Status   Specimen Description BLOOD RIGHT HAND   Final   Special Requests BOTTLES DRAWN AEROBIC ONLY 4CC   Final   Culture NO GROWTH 5 DAYS   Final   Report Status 08/04/2012 FINAL   Final  CULTURE, BLOOD (ROUTINE X 2)     Status: None   Collection Time    07/30/12  5:31 PM      Result Value Range Status   Specimen  Description BLOOD LEFT ANTECUBITAL   Final   Special Requests BOTTLES DRAWN AEROBIC AND ANAEROBIC 6CC   Final   Culture NO GROWTH 5 DAYS   Final   Report Status 08/04/2012 FINAL   Final  MRSA PCR SCREENING     Status: Abnormal   Collection Time    07/30/12  6:00 PM      Result Value Range Status   MRSA by PCR POSITIVE (*) NEGATIVE Final   Comment:            The GeneXpert MRSA Assay (FDA     approved for NASAL specimens     only), is one component of a     comprehensive MRSA colonization     surveillance program. It is not     intended to diagnose MRSA     infection nor to guide or     monitor treatment for     MRSA infections.     CRITICAL RESULT CALLED TO, READ BACK BY AND VERIFIED WITH:     M. SIMPSON AT 2030 ON 07/30/12 BY S. VANHOORNE   Studies/Results: No results found.  Medications:  Prior to Admission:  Prescriptions prior to admission  Medication Sig Dispense Refill  . albuterol (PROVENTIL HFA;VENTOLIN HFA) 108 (90 BASE) MCG/ACT inhaler Inhale 2 puffs into the lungs 2 (two) times daily. For shortness of breath      . ALPRAZolam (XANAX) 0.5 MG tablet Take 1 tablet (0.5 mg total) by mouth 3 (three) times daily as needed. For anxiety  5 tablet  0  . aspirin EC 81 MG tablet Take 81 mg by mouth daily.      . benzonatate (TESSALON) 100 MG capsule Take 1 capsule (100 mg total) by mouth 3 (three) times daily.  20 capsule  0  . calcitRIOL (ROCALTROL) 0.25 MCG capsule Take 1 capsule (0.25 mcg total) by mouth daily.      . carbamide peroxide (DEBROX) 6.5 % otic solution Place 5 drops into both ears 2 (two) times daily.  15 mL  0  . digoxin (LANOXIN) 0.125 MG tablet Take 1 tablet (0.125 mg total) by mouth daily.  30 tablet  0  . esomeprazole (NEXIUM) 40 MG capsule Take 40 mg by mouth 2 (two) times daily.       . fluticasone (FLONASE) 50 MCG/ACT nasal spray Place 1 spray into the nose daily.        . Fluticasone-Salmeterol (ADVAIR) 250-50 MCG/DOSE AEPB Inhale 1 puff into the lungs  every 12 (twelve) hours.        . gabapentin (NEURONTIN) 100 MG capsule Take 100 mg by mouth 3 (three) times daily.      Marland Kitchen HYDROcodone-acetaminophen (NORCO/VICODIN) 5-325 MG per tablet Take 1 tablet by mouth every  4 (four) hours as needed.  30 tablet  0  . insulin aspart (NOVOLOG) 100 UNIT/ML injection Inject 10 Units into the skin 3 (three) times daily with meals.  1 vial  0  . insulin detemir (LEVEMIR FLEXPEN) 100 UNIT/ML injection Inject 60 Units into the skin at bedtime.  10 mL  12  . ipratropium (ATROVENT) 0.02 % nebulizer solution Take 2.5 mLs (0.5 mg total) by nebulization every 6 (six) hours.  75 mL  0  . iron polysaccharides (NIFEREX) 150 MG capsule Take 1 capsule (150 mg total) by mouth 2 (two) times daily.  30 capsule  0  . levalbuterol (XOPENEX) 0.63 MG/3ML nebulizer solution Take 3 mLs (0.63 mg total) by nebulization every 6 (six) hours.  3 mL  0  . levothyroxine (SYNTHROID, LEVOTHROID) 200 MCG tablet Take 200 mcg by mouth daily.      Marland Kitchen linagliptin (TRADJENTA) 5 MG TABS tablet Take 5 mg by mouth every morning.       . nitroGLYCERIN (NITROSTAT) 0.4 MG SL tablet Place 1 tablet (0.4 mg total) under the tongue every 5 (five) minutes as needed for chest pain.  25 tablet  3  . polyethylene glycol (MIRALAX / GLYCOLAX) packet Take 17 g by mouth daily.  14 each    . predniSONE (DELTASONE) 5 MG tablet Take 1 tablet (5 mg total) by mouth daily.  30 tablet  0  . Rivaroxaban (XARELTO) 20 MG TABS Take 20 mg by mouth daily.      . rosuvastatin (CRESTOR) 40 MG tablet Take 1 tablet (40 mg total) by mouth at bedtime.  90 tablet  3  . sertraline (ZOLOFT) 50 MG tablet Take 50 mg by mouth daily.      . sodium chloride (OCEAN) 0.65 % SOLN nasal spray Place 1 spray into the nose as needed for congestion.      Marland Kitchen tolterodine (DETROL LA) 4 MG 24 hr capsule Take 4 mg by mouth every morning.       . torsemide (DEMADEX) 20 MG tablet Take 1 tablet (20 mg total) by mouth daily.  30 tablet  0  . vitamin B-12 1000  MCG tablet Take 1 tablet (1,000 mcg total) by mouth daily.  30 tablet  0   Scheduled: . aspirin EC  81 mg Oral Daily  . atorvastatin  80 mg Oral q1800  . calcitRIOL  0.25 mcg Oral Daily  . carbamide peroxide  5 drop Both Ears BID  . diltiazem  30 mg Oral Q6H  . enoxaparin (LOVENOX) injection  140 mg Subcutaneous BID  . fesoterodine  8 mg Oral Daily  . fluticasone  1 spray Each Nare Daily  . furosemide  160 mg Intravenous BID  . gabapentin  100 mg Oral TID  . insulin aspart  0-5 Units Subcutaneous QHS  . insulin aspart  0-9 Units Subcutaneous TID WC  . insulin detemir  10 Units Subcutaneous QHS  . levalbuterol  0.63 mg Nebulization Q6H  . levothyroxine  200 mcg Oral Daily  . mometasone-formoterol  2 puff Inhalation BID  . pantoprazole  80 mg Oral Q1200  . polyethylene glycol  17 g Oral Daily  . potassium chloride  20 mEq Oral Daily  . predniSONE  5 mg Oral Daily  . sertraline  50 mg Oral Daily  . sodium chloride  3 mL Intravenous Q12H  . torsemide  50 mg Oral Daily  . vitamin B-12  1,000 mcg Oral Daily   Continuous:  YNW:[id number], morphine  injection, ondansetron (ZOFRAN) IV, ondansetron, polyvinyl alcohol  Assesment: She was admitted with sepsis. She has diabetes. She has anasarca which I think is about the same. She's complaining of abdominal pain and now has started vomiting. She says she's more short of breath although her exam is about the same. She has renal dysfunction which has improved. She has diabetes and her blood sugars are reasonably well controlled she has morbid obesity which makes examination more problematic Principal Problem:   Septic shock Active Problems:   HYPOTHYROIDISM   HYPERLIPIDEMIA   HYPERTENSION   CAD   GASTROESOPHAGEAL REFLUX DISEASE   Cellulitis of leg without foot, left   Diabetes mellitus type 2 with complications   Chronic diastolic heart failure   Adrenal insufficiency   ARF (acute renal failure)   Chronic anticoagulation   Morbid  obesity   Chronic kidney disease, stage III (moderate)   Atrial fibrillation   Hypotension, unspecified   UTI (urinary tract infection)   Acute on chronic diastolic heart failure   Anasarca    Plan: I'm going to have her get a CT of the abdomen and pelvis with with oral contrast. Continue all of her other treatments. Because she's having so much difficulty getting up I'm going to have a Foley catheter replaced since she's on IV diuresis    LOS: 9 days   Briceson Broadwater L 08/08/2012, 10:16 AM

## 2012-08-08 NOTE — Progress Notes (Signed)
Subjective: Interval History: has complaints difficulty in breathing. She is still cough. She has an episode of vomiting last night. Presently she said she is not feeling good..  Objective: Vital signs in last 24 hours: Temp:  [97.9 F (36.6 C)-98.4 F (36.9 C)] 98.4 F (36.9 C) (03/15 0616) Pulse Rate:  [80-84] 80 (03/15 0616) Resp:  [20] 20 (03/15 0616) BP: (111-125)/(45-72) 119/72 mmHg (03/15 0616) SpO2:  [92 %-96 %] 95 % (03/15 0708) Weight change:   Intake/Output from previous day: 03/14 0701 - 03/15 0700 In: 723 [P.O.:720; I.V.:3] Out: 1225 [Urine:1225] Intake/Output this shift:    General appearance: mild distress Resp: diminished breath sounds anterior - bilateral and rales posterior - bilateral Cardio: regular rate and rhythm, S1, S2 normal, no murmur, click, rub or gallop GI: soft, non-tender; bowel sounds normal; no masses,  no organomegaly Extremities: edema 3+ edema  Lab Results:  Recent Labs  08/07/12 0458  WBC 10.1  HGB 8.7*  HCT 28.7*  PLT 178   BMET:  Recent Labs  08/06/12 0454 08/07/12 0458  NA 141 141  K 3.5 3.7  CL 105 103  CO2 26 27  GLUCOSE 173* 220*  BUN 28* 24*  CREATININE 1.39* 1.31*  CALCIUM 8.4 8.6   No results found for this basename: PTH,  in the last 72 hours Iron Studies: No results found for this basename: IRON, TIBC, TRANSFERRIN, FERRITIN,  in the last 72 hours  Studies/Results: No results found.  I have reviewed the patient's current medications.  Assessment/Plan: Problem #1 renal failure chronic again is 24 creatinine is 1.31 renal function is stable Problem #2 anasarca presently she is on Bumex IV and start her on Demadex orally. She is a reasonable urine output but remains with significant fluid overload. Problem #3 hypertension her blood pressure seems to be reasonable controlled Problem #4 hypothyroidism Problem #5 morbid obesity Problem #6 arterial fibrillation Problem #7 anemia her hemoglobin and hematocrit  remains low. Problem #8 metabolic bone disease she is on Rocaltrol. Plan: We'll DC Bumex IV and changed him to 5 milligrams by mouth twice a day DC Demadex Lasix 160 mg IV twice a day Basic metabolic panel in the morning.   LOS: 9 days   Brigid Vandekamp S 08/08/2012,8:27 AM

## 2012-08-09 ENCOUNTER — Inpatient Hospital Stay (HOSPITAL_COMMUNITY): Payer: Medicare Other

## 2012-08-09 DIAGNOSIS — R079 Chest pain, unspecified: Secondary | ICD-10-CM | POA: Diagnosis not present

## 2012-08-09 DIAGNOSIS — N179 Acute kidney failure, unspecified: Secondary | ICD-10-CM | POA: Diagnosis not present

## 2012-08-09 DIAGNOSIS — A419 Sepsis, unspecified organism: Secondary | ICD-10-CM | POA: Diagnosis not present

## 2012-08-09 DIAGNOSIS — R6521 Severe sepsis with septic shock: Secondary | ICD-10-CM | POA: Diagnosis not present

## 2012-08-09 LAB — GLUCOSE, CAPILLARY
Glucose-Capillary: 146 mg/dL — ABNORMAL HIGH (ref 70–99)
Glucose-Capillary: 223 mg/dL — ABNORMAL HIGH (ref 70–99)
Glucose-Capillary: 237 mg/dL — ABNORMAL HIGH (ref 70–99)
Glucose-Capillary: 201 mg/dL — ABNORMAL HIGH (ref 70–99)

## 2012-08-09 LAB — TROPONIN I
Troponin I: <0.3 ng/mL (ref <0.30)
Troponin I: <0.3 ng/mL (ref <0.30)
Troponin I: <0.3 ng/mL (ref <0.30)

## 2012-08-09 LAB — BASIC METABOLIC PANEL
Chloride: 99 mEq/L (ref 96–112)
Creatinine, Ser: 1.14 mg/dL — ABNORMAL HIGH (ref 0.50–1.10)
GFR calc Af Amer: 57 mL/min — ABNORMAL LOW (ref >90)
Potassium: 3.6 mEq/L (ref 3.5–5.1)
Sodium: 140 mEq/L (ref 135–145)

## 2012-08-09 MED ORDER — CALCIUM CARBONATE 1250 MG/5ML PO SUSP
500.0000 mg | Freq: Four times a day (QID) | ORAL | Status: DC | PRN
  Administered 2012-08-09 – 2012-08-10 (×2): 500 mg via ORAL
  Filled 2012-08-09: qty 5

## 2012-08-09 MED ORDER — CIPROFLOXACIN IN D5W 200 MG/100ML IV SOLN
INTRAVENOUS | Status: AC
  Filled 2012-08-09: qty 100

## 2012-08-09 MED ORDER — METRONIDAZOLE IN NACL 5-0.79 MG/ML-% IV SOLN
INTRAVENOUS | Status: AC
  Filled 2012-08-09: qty 100

## 2012-08-09 NOTE — Progress Notes (Signed)
Ann [name]  MRN: [id number]  DOB/AGE: Jun 13, 1945 67 y.o.  Primary Care Physician:HAWKINS,EDWARD L, MD  Admit date: 07/30/2012  Chief Complaint:  Chief Complaint  Patient presents with  . Weakness    S-Pt presented on  07/30/2012 with  Chief Complaint  Patient presents with  . Weakness  . Pt c/o chest pain, started at 2 am , worsens with coughing.mid sternal in location.     Meds . aspirin EC  81 mg Oral Daily  . atorvastatin  80 mg Oral q1800  . calcitRIOL  0.25 mcg Oral Daily  . carbamide peroxide  5 drop Both Ears BID  . ciprofloxacin  200 mg Intravenous Q12H  . diltiazem  30 mg Oral Q6H  . enoxaparin (LOVENOX) injection  120 mg Subcutaneous BID  . fesoterodine  8 mg Oral Daily  . fluticasone  1 spray Each Nare Daily  . furosemide  160 mg Intravenous BID  . gabapentin  100 mg Oral TID  . insulin aspart  0-5 Units Subcutaneous QHS  . insulin aspart  0-9 Units Subcutaneous TID WC  . insulin detemir  10 Units Subcutaneous QHS  . levalbuterol  0.63 mg Nebulization Q6H  . levothyroxine  200 mcg Oral Daily  . metronidazole  500 mg Intravenous Q8H  . mometasone-formoterol  2 puff Inhalation BID  . pantoprazole  80 mg Oral Q1200  . polyethylene glycol  17 g Oral Daily  . potassium chloride  20 mEq Oral Daily  . predniSONE  5 mg Oral Daily  . sertraline  50 mg Oral Daily  . sodium chloride  3 mL Intravenous Q12H  . torsemide  50 mg Oral Daily  . vitamin B-12  1,000 mcg Oral Daily      Physical Exam: Vital signs in last 24 hours: Temp:  [97.4 F (36.3 C)-98.2 F (36.8 C)] 98.2 F (36.8 C) (03/16 0643) Pulse Rate:  [84-87] 84 (03/16 0643) Resp:  [20] 20 (03/16 0643) BP: (112-124)/(55-67) 112/55 mmHg (03/16 0643) SpO2:  [92 %-96 %] 92 % (03/16 0657) Weight:  [306 lb 12.8 oz (139.164 kg)] 306 lb 12.8 oz (139.164 kg) (03/16 0643) Weight change:  Last BM Date: 08/08/12  Intake/Output from previous day: 03/15 0701 - 03/16 0700 In: 720 [P.O.:720] Out: 4250  [Urine:4250]     Physical Exam: General- pt is awake,alert, follows comands Resp- No acute Resp distress, Decreased BS at bases., Wheezing + CVS- S1S2 regular in rate and rhythm-distant GIT- BS+, soft, NT, ND, Morbidly obese EXT- 2+ LE Edema, NO Cyanosis            Edema  Better in UE   Lab Results: CBC  Recent Labs  08/07/12 0458  WBC 10.1  HGB 8.7*  HCT 28.7*  PLT 178    BMET  Recent Labs  08/07/12 0458 08/09/12 0644  NA 141 140  K 3.7 3.6  CL 103 99  CO2 27 31  GLUCOSE 220* 147*  BUN 24* 18  CREATININE 1.31* 1.14*  CALCIUM 8.6 8.5   Creat Trend 2014 3.62==>2.46==>1.59==>1.5==>1.3==>1.14          2.4==>1.04 ( Last admission) 2013 0.8--1.0 2012 0.9--1.13    MICRO Recent Results (from the past 240 hour(s))  URINE CULTURE     Status: None   Collection Time    07/30/12  3:33 PM      Result Value Range Status   Specimen Description URINE, CATHETERIZED   Final   Special Requests NONE   Final  Culture  Setup Time 07/31/2012 01:06   Final   Colony Count NO GROWTH   Final   Culture NO GROWTH   Final   Report Status 07/31/2012 FINAL   Final  CULTURE, BLOOD (ROUTINE X 2)     Status: None   Collection Time    07/30/12  5:31 PM      Result Value Range Status   Specimen Description BLOOD RIGHT HAND   Final   Special Requests BOTTLES DRAWN AEROBIC ONLY 4CC   Final   Culture NO GROWTH 5 DAYS   Final   Report Status 08/04/2012 FINAL   Final  CULTURE, BLOOD (ROUTINE X 2)     Status: None   Collection Time    07/30/12  5:31 PM      Result Value Range Status   Specimen Description BLOOD LEFT ANTECUBITAL   Final   Special Requests BOTTLES DRAWN AEROBIC AND ANAEROBIC 6CC   Final   Culture NO GROWTH 5 DAYS   Final   Report Status 08/04/2012 FINAL   Final  MRSA PCR SCREENING     Status: Abnormal   Collection Time    07/30/12  6:00 PM      Result Value Range Status   MRSA by PCR POSITIVE (*) NEGATIVE Final   Comment:            The GeneXpert MRSA Assay (FDA      approved for NASAL specimens     only), is one component of a     comprehensive MRSA colonization     surveillance program. It is not     intended to diagnose MRSA     infection nor to guide or     monitor treatment for     MRSA infections.     CRITICAL RESULT CALLED TO, READ BACK BY AND VERIFIED WITH:     M. SIMPSON AT 2030 ON 07/30/12 BY Wynonia Lawman      Lab Results  Component Value Date   PTH 177.0* 06/28/2012   CALCIUM 8.5 08/09/2012   PHOS 2.7 08/04/2012    CT abdo IMPRESSION:  Acute sigmoid diverticulitis with a phlegmon around that area. No  abscess or free air. There is a soft tissue stranding around the  area.   2d echo Left ventricle: The cavity size was moderately reduced. Mild to moderate LVH. Systolic function was hyperdynamic. - Right ventricle: The cavity size was moderately decreased. Wall thickness was increased.  Impression: 1)Renal  AKI secondary to Prerenal/ ATn AKI most likely Non Oliguric ATN/Cardiorenal  Creat now at 1.3 AKi better  2)HTN  Target Organ damage  CAD  Medication- On Diuretics On Calcium channel blockers   3)Anemia HGb NOt at goal (9--11) Iron deficincy    4)CKD Mineral-Bone Disorder PTH elevated. Secondary Hyperparathyroidism  Present- on calcitriol Phosphorus at goal.   5)ID- CT showed Diverticulitis On Cipro + Metro  6)FEN  Normokalemic NOrmonatremic    7)Acid base Co2 at goal  8) Anasarca-  Pt on IV Lasix   Plan:  Will cycle trops Will ask for EKG Will ask for CXR Will continue diuresis.      Ann [name] S 08/09/2012, 8:10 AM

## 2012-08-09 NOTE — Progress Notes (Signed)
Subjective: Her CT yesterday showed diverticulitis and she has been started on treatment for that. This morning she says she's having some chest discomfort and has troponin levels ordered and the first of those is negative. EKG does not show acute changes. She's also complaining of a sore throat and I think some of her problem may be from reflux.  Objective: Vital signs in last 24 hours: Temp:  [97.4 F (36.3 C)-98.2 F (36.8 C)] 98.2 F (36.8 C) (03/16 0643) Pulse Rate:  [84-87] 84 (03/16 0643) Resp:  [20] 20 (03/16 0643) BP: (112-124)/(55-67) 112/55 mmHg (03/16 0643) SpO2:  [92 %-96 %] 92 % (03/16 0657) Weight:  [139.164 kg (306 lb 12.8 oz)] 139.164 kg (306 lb 12.8 oz) (03/16 4540) Weight change:  Last BM Date: 08/08/12  Intake/Output from previous day: 03/15 0701 - 03/16 0700 In: 720 [P.O.:720] Out: 4250 [Urine:4250]  PHYSICAL EXAM General appearance: alert, cooperative, mild distress and morbidly obese Resp: clear to auscultation bilaterally Cardio: regular rate and rhythm, S1, S2 normal, no murmur, click, rub or gallop GI: Mild left lower quadrant tenderness Extremities: edema Is still present but improved  Lab Results:    Basic Metabolic Panel:  Recent Labs  98/11/91 0458 08/09/12 0644  NA 141 140  K 3.7 3.6  CL 103 99  CO2 27 31  GLUCOSE 220* 147*  BUN 24* 18  CREATININE 1.31* 1.14*  CALCIUM 8.6 8.5   Liver Function Tests: No results found for this basename: AST, ALT, ALKPHOS, BILITOT, PROT, ALBUMIN,  in the last 72 hours No results found for this basename: LIPASE, AMYLASE,  in the last 72 hours No results found for this basename: AMMONIA,  in the last 72 hours CBC:  Recent Labs  08/07/12 0458  WBC 10.1  HGB 8.7*  HCT 28.7*  MCV 90.0  PLT 178   Cardiac Enzymes:  Recent Labs  08/09/12 0810  TROPONINI <0.30   BNP: No results found for this basename: PROBNP,  in the last 72 hours D-Dimer: No results found for this basename: DDIMER,  in the  last 72 hours CBG:  Recent Labs  08/07/12 2145 08/08/12 0714 08/08/12 1130 08/08/12 1647 08/08/12 2024 08/09/12 0730  GLUCAP 268* 151* 211* 248* 216* 146*   Hemoglobin A1C: No results found for this basename: HGBA1C,  in the last 72 hours Fasting Lipid Panel: No results found for this basename: CHOL, HDL, LDLCALC, TRIG, CHOLHDL, LDLDIRECT,  in the last 72 hours Thyroid Function Tests: No results found for this basename: TSH, T4TOTAL, FREET4, T3FREE, THYROIDAB,  in the last 72 hours Anemia Panel: No results found for this basename: VITAMINB12, FOLATE, FERRITIN, TIBC, IRON, RETICCTPCT,  in the last 72 hours Coagulation: No results found for this basename: LABPROT, INR,  in the last 72 hours Urine Drug Screen: Drugs of Abuse  No results found for this basename: labopia, cocainscrnur, labbenz, amphetmu, thcu, labbarb    Alcohol Level: No results found for this basename: ETH,  in the last 72 hours Urinalysis: No results found for this basename: COLORURINE, APPERANCEUR, LABSPEC, PHURINE, GLUCOSEU, HGBUR, BILIRUBINUR, KETONESUR, PROTEINUR, UROBILINOGEN, NITRITE, LEUKOCYTESUR,  in the last 72 hours Misc. Labs:  ABGS No results found for this basename: PHART, PCO2, PO2ART, TCO2, HCO3,  in the last 72 hours CULTURES Recent Results (from the past 240 hour(s))  URINE CULTURE     Status: None   Collection Time    07/30/12  3:33 PM      Result Value Range Status   Specimen  Description URINE, CATHETERIZED   Final   Special Requests NONE   Final   Culture  Setup Time 07/31/2012 01:06   Final   Colony Count NO GROWTH   Final   Culture NO GROWTH   Final   Report Status 07/31/2012 FINAL   Final  CULTURE, BLOOD (ROUTINE X 2)     Status: None   Collection Time    07/30/12  5:31 PM      Result Value Range Status   Specimen Description BLOOD RIGHT HAND   Final   Special Requests BOTTLES DRAWN AEROBIC ONLY 4CC   Final   Culture NO GROWTH 5 DAYS   Final   Report Status 08/04/2012 FINAL    Final  CULTURE, BLOOD (ROUTINE X 2)     Status: None   Collection Time    07/30/12  5:31 PM      Result Value Range Status   Specimen Description BLOOD LEFT ANTECUBITAL   Final   Special Requests BOTTLES DRAWN AEROBIC AND ANAEROBIC 6CC   Final   Culture NO GROWTH 5 DAYS   Final   Report Status 08/04/2012 FINAL   Final  MRSA PCR SCREENING     Status: Abnormal   Collection Time    07/30/12  6:00 PM      Result Value Range Status   MRSA by PCR POSITIVE (*) NEGATIVE Final   Comment:            The GeneXpert MRSA Assay (FDA     approved for NASAL specimens     only), is one component of a     comprehensive MRSA colonization     surveillance program. It is not     intended to diagnose MRSA     infection nor to guide or     monitor treatment for     MRSA infections.     CRITICAL RESULT CALLED TO, READ BACK BY AND VERIFIED WITH:     M. SIMPSON AT 2030 ON 07/30/12 BY S. VANHOORNE   Studies/Results: Ct Abdomen Pelvis Wo Contrast  08/08/2012  *RADIOLOGY REPORT*  Clinical Data: Diffuse abdominal pain.  CT ABDOMEN AND PELVIS WITHOUT CONTRAST  Technique:  Multidetector CT imaging of the abdomen and pelvis was performed following the standard protocol without intravenous contrast.  Comparison: 06/16/2006  Findings: Slight bibasilar atelectasis.  Small left effusion.  The patient has acute proximal sigmoid diverticulitis with a pericolonic phlegmon but no abscess.  There is no free air.  Slight soft tissue stranding around that area.  There is slight diffuse hepatic steatosis.  Gallbladder has been removed.  No dilated bile ducts.  Spleen, pancreas, adrenal glands, and kidneys are normal.  No dilated loops of bowel.  Uterus and ovaries appear to have been removed.  The patient has prominent subcutaneous edema particularly along the left side of the abdomen.  Foley catheter in place.  IMPRESSION: Acute sigmoid diverticulitis with a phlegmon around that area.  No abscess or free air.  There is a soft tissue  stranding around the area.  Nonspecific subcutaneous edema in the abdomen particularly along the left lateral aspect.   Original Report Authenticated By: Francene Boyers, M.D.     Medications:  Prior to Admission:  Prescriptions prior to admission  Medication Sig Dispense Refill  . albuterol (PROVENTIL HFA;VENTOLIN HFA) 108 (90 BASE) MCG/ACT inhaler Inhale 2 puffs into the lungs 2 (two) times daily. For shortness of breath      . ALPRAZolam (XANAX) 0.5 MG  tablet Take 1 tablet (0.5 mg total) by mouth 3 (three) times daily as needed. For anxiety  5 tablet  0  . aspirin EC 81 MG tablet Take 81 mg by mouth daily.      . benzonatate (TESSALON) 100 MG capsule Take 1 capsule (100 mg total) by mouth 3 (three) times daily.  20 capsule  0  . calcitRIOL (ROCALTROL) 0.25 MCG capsule Take 1 capsule (0.25 mcg total) by mouth daily.      . carbamide peroxide (DEBROX) 6.5 % otic solution Place 5 drops into both ears 2 (two) times daily.  15 mL  0  . digoxin (LANOXIN) 0.125 MG tablet Take 1 tablet (0.125 mg total) by mouth daily.  30 tablet  0  . esomeprazole (NEXIUM) 40 MG capsule Take 40 mg by mouth 2 (two) times daily.       . fluticasone (FLONASE) 50 MCG/ACT nasal spray Place 1 spray into the nose daily.        . Fluticasone-Salmeterol (ADVAIR) 250-50 MCG/DOSE AEPB Inhale 1 puff into the lungs every 12 (twelve) hours.        . gabapentin (NEURONTIN) 100 MG capsule Take 100 mg by mouth 3 (three) times daily.      Marland Kitchen HYDROcodone-acetaminophen (NORCO/VICODIN) 5-325 MG per tablet Take 1 tablet by mouth every 4 (four) hours as needed.  30 tablet  0  . insulin aspart (NOVOLOG) 100 UNIT/ML injection Inject 10 Units into the skin 3 (three) times daily with meals.  1 vial  0  . insulin detemir (LEVEMIR FLEXPEN) 100 UNIT/ML injection Inject 60 Units into the skin at bedtime.  10 mL  12  . ipratropium (ATROVENT) 0.02 % nebulizer solution Take 2.5 mLs (0.5 mg total) by nebulization every 6 (six) hours.  75 mL  0  . iron  polysaccharides (NIFEREX) 150 MG capsule Take 1 capsule (150 mg total) by mouth 2 (two) times daily.  30 capsule  0  . levalbuterol (XOPENEX) 0.63 MG/3ML nebulizer solution Take 3 mLs (0.63 mg total) by nebulization every 6 (six) hours.  3 mL  0  . levothyroxine (SYNTHROID, LEVOTHROID) 200 MCG tablet Take 200 mcg by mouth daily.      Marland Kitchen linagliptin (TRADJENTA) 5 MG TABS tablet Take 5 mg by mouth every morning.       . nitroGLYCERIN (NITROSTAT) 0.4 MG SL tablet Place 1 tablet (0.4 mg total) under the tongue every 5 (five) minutes as needed for chest pain.  25 tablet  3  . polyethylene glycol (MIRALAX / GLYCOLAX) packet Take 17 g by mouth daily.  14 each    . predniSONE (DELTASONE) 5 MG tablet Take 1 tablet (5 mg total) by mouth daily.  30 tablet  0  . Rivaroxaban (XARELTO) 20 MG TABS Take 20 mg by mouth daily.      . rosuvastatin (CRESTOR) 40 MG tablet Take 1 tablet (40 mg total) by mouth at bedtime.  90 tablet  3  . sertraline (ZOLOFT) 50 MG tablet Take 50 mg by mouth daily.      . sodium chloride (OCEAN) 0.65 % SOLN nasal spray Place 1 spray into the nose as needed for congestion.      Marland Kitchen tolterodine (DETROL LA) 4 MG 24 hr capsule Take 4 mg by mouth every morning.       . torsemide (DEMADEX) 20 MG tablet Take 1 tablet (20 mg total) by mouth daily.  30 tablet  0  . vitamin B-12 1000 MCG tablet Take  1 tablet (1,000 mcg total) by mouth daily.  30 tablet  0   Scheduled: . aspirin EC  81 mg Oral Daily  . atorvastatin  80 mg Oral q1800  . calcitRIOL  0.25 mcg Oral Daily  . carbamide peroxide  5 drop Both Ears BID  . ciprofloxacin  200 mg Intravenous Q12H  . diltiazem  30 mg Oral Q6H  . enoxaparin (LOVENOX) injection  120 mg Subcutaneous BID  . fesoterodine  8 mg Oral Daily  . fluticasone  1 spray Each Nare Daily  . furosemide  160 mg Intravenous BID  . gabapentin  100 mg Oral TID  . insulin aspart  0-5 Units Subcutaneous QHS  . insulin aspart  0-9 Units Subcutaneous TID WC  . insulin detemir   10 Units Subcutaneous QHS  . levalbuterol  0.63 mg Nebulization Q6H  . levothyroxine  200 mcg Oral Daily  . metronidazole  500 mg Intravenous Q8H  . mometasone-formoterol  2 puff Inhalation BID  . pantoprazole  80 mg Oral Q1200  . polyethylene glycol  17 g Oral Daily  . potassium chloride  20 mEq Oral Daily  . predniSONE  5 mg Oral Daily  . sertraline  50 mg Oral Daily  . sodium chloride  3 mL Intravenous Q12H  . torsemide  50 mg Oral Daily  . vitamin B-12  1,000 mcg Oral Daily   Continuous:  ZOX:WRUEAVW carbonate (dosed in mg elemental calcium), levalbuterol, morphine injection, ondansetron (ZOFRAN) IV, ondansetron, polyvinyl alcohol  Assesment: She has a new problem which is diverticulitis. She is also complaining of chest pain and although she does have cardiac disease this does not appear to be an acute MI at least. She is in no distress. She has anasarca which is improving. She has acute on chronic diastolic heart failure. She has chronic atrial fibrillation. I think some of this is from GERD Principal Problem:   Septic shock Active Problems:   HYPOTHYROIDISM   HYPERLIPIDEMIA   HYPERTENSION   CAD   GASTROESOPHAGEAL REFLUX DISEASE   Cellulitis of leg without foot, left   Diabetes mellitus type 2 with complications   Chronic diastolic heart failure   Adrenal insufficiency   ARF (acute renal failure)   Chronic anticoagulation   Morbid obesity   Chronic kidney disease, stage III (moderate)   Atrial fibrillation   Hypotension, unspecified   UTI (urinary tract infection)   Acute on chronic diastolic heart failure   Anasarca    Plan: She will start calcium carbonate. She will continue to have cycled cardiac enzymes and EKGs. She will continue treatment for diverticulitis    LOS: 10 days   Naylee Frankowski L 08/09/2012, 10:48 AM

## 2012-08-10 ENCOUNTER — Encounter (HOSPITAL_COMMUNITY): Payer: Medicare Other

## 2012-08-10 ENCOUNTER — Inpatient Hospital Stay (HOSPITAL_COMMUNITY): Payer: Medicare Other

## 2012-08-10 DIAGNOSIS — Z452 Encounter for adjustment and management of vascular access device: Secondary | ICD-10-CM | POA: Diagnosis not present

## 2012-08-10 DIAGNOSIS — I509 Heart failure, unspecified: Secondary | ICD-10-CM | POA: Diagnosis not present

## 2012-08-10 DIAGNOSIS — N179 Acute kidney failure, unspecified: Secondary | ICD-10-CM | POA: Diagnosis not present

## 2012-08-10 LAB — CBC
MCH: 27.1 pg (ref 26.0–34.0)
Platelets: 184 10*3/uL (ref 150–400)
RBC: 3.32 MIL/uL — ABNORMAL LOW (ref 3.87–5.11)

## 2012-08-10 LAB — GLUCOSE, CAPILLARY
Glucose-Capillary: 224 mg/dL — ABNORMAL HIGH (ref 70–99)
Glucose-Capillary: 256 mg/dL — ABNORMAL HIGH (ref 70–99)

## 2012-08-10 MED ORDER — SODIUM CHLORIDE 0.9 % IJ SOLN: 10.0000 mL | INTRAMUSCULAR | Status: DC | PRN

## 2012-08-10 MED ORDER — MAGIC MOUTHWASH
10.0000 mL | Freq: Three times a day (TID) | ORAL | Status: DC
  Administered 2012-08-10 – 2012-08-12 (×8): 10 mL via ORAL
  Filled 2012-08-10: qty 10
  Filled 2012-08-10: qty 5
  Filled 2012-08-10 (×6): qty 10

## 2012-08-10 MED ORDER — FLUCONAZOLE 100 MG PO TABS
100.0000 mg | ORAL_TABLET | Freq: Every day | ORAL | Status: DC
  Administered 2012-08-10 – 2012-08-12 (×3): 100 mg via ORAL
  Filled 2012-08-10 (×3): qty 1

## 2012-08-10 MED ORDER — SODIUM CHLORIDE 0.9 % IJ SOLN
10.0000 mL | Freq: Two times a day (BID) | INTRAMUSCULAR | Status: DC
  Administered 2012-08-10 – 2012-08-12 (×2): 10 mL

## 2012-08-10 NOTE — Progress Notes (Signed)
Inpatient Diabetes Program Recommendations  AACE/ADA: New Consensus Statement on Inpatient Glycemic Control (2013)  Target Ranges:  Prepandial:   less than 140 mg/dL      Peak postprandial:   less than 180 mg/dL (1-2 hours)      Critically ill patients:  140 - 180 mg/dL     Inpatient Diabetes Program Recommendations Correction (SSI): Please consider increasing Novolog correction to moderate scale. Insulin - Meal Coverage: Please consider ordering Novolog 3 units TID meal coverage.  Note: Patient has a history of diabetes and takes Levemir 60 units QHS, Tradjenta 5mg  QAM, and Novolog 10 units TID at home for diabetes management.  Currently, patient is ordered to receive Levemir 10 units QHS and Novolog sensitive correction ACHS for inpatient glycemic control.  Fasting glucose on 3/16 was 146 mg/dl and on 1/61 was 096 mg/dl.  Post-prandial blood sugars are elevated.  Please consider increasing Novolog correction to moderate scale and adding Novolog 3 units TID meal coverage as long as patient is eating at least 50% of meals.  Will continue to follow.  Thanks, Orlando Penner, RN, BSN, CCRN Diabetes Coordinator Inpatient Diabetes Program [phone]

## 2012-08-10 NOTE — Progress Notes (Signed)
Subjective: Interval History: has complaints difficulty in breathing and some nausea. Her appetite is still poor. She has cough but nonproductive..  Objective: Vital signs in last 24 hours: Temp:  [98 F (36.7 C)-98.2 F (36.8 C)] 98.2 F (36.8 C) (03/17 0611) Pulse Rate:  [71-81] 81 (03/16 2032) Resp:  [20] 20 (03/17 0611) BP: (102-130)/(66-76) 102/66 mmHg (03/17 0611) SpO2:  [91 %-96 %] 91 % (03/17 0747) Weight:  [136.76 kg (301 lb 8 oz)] 136.76 kg (301 lb 8 oz) (03/17 0611) Weight change: -2.404 kg (-5 lb 4.8 oz)  Intake/Output from previous day: 03/16 0701 - 03/17 0700 In: 720 [P.O.:720] Out: 2200 [Urine:2200] Intake/Output this shift:    General appearance: alert, cooperative and no distress Resp: diminished breath sounds anterior - bilateral and rales anterior - bilateral Cardio: regular rate and rhythm, S1, S2 normal, no murmur, click, rub or gallop GI: soft, non-tender; bowel sounds normal; no masses,  no organomegaly Extremities: edema 2-3+ edema  Lab Results:  Recent Labs  08/10/12 0520  WBC 9.9  HGB 9.0*  HCT 30.1*  PLT 184   BMET:  Recent Labs  08/09/12 0644  NA 140  K 3.6  CL 99  CO2 31  GLUCOSE 147*  BUN 18  CREATININE 1.14*  CALCIUM 8.5   No results found for this basename: PTH,  in the last 72 hours Iron Studies: No results found for this basename: IRON, TIBC, TRANSFERRIN, FERRITIN,  in the last 72 hours  Studies/Results: Ct Abdomen Pelvis Wo Contrast  08/08/2012  *RADIOLOGY REPORT*  Clinical Data: Diffuse abdominal pain.  CT ABDOMEN AND PELVIS WITHOUT CONTRAST  Technique:  Multidetector CT imaging of the abdomen and pelvis was performed following the standard protocol without intravenous contrast.  Comparison: 06/16/2006  Findings: Slight bibasilar atelectasis.  Small left effusion.  The patient has acute proximal sigmoid diverticulitis with a pericolonic phlegmon but no abscess.  There is no free air.  Slight soft tissue stranding around that  area.  There is slight diffuse hepatic steatosis.  Gallbladder has been removed.  No dilated bile ducts.  Spleen, pancreas, adrenal glands, and kidneys are normal.  No dilated loops of bowel.  Uterus and ovaries appear to have been removed.  The patient has prominent subcutaneous edema particularly along the left side of the abdomen.  Foley catheter in place.  IMPRESSION: Acute sigmoid diverticulitis with a phlegmon around that area.  No abscess or free air.  There is a soft tissue stranding around the area.  Nonspecific subcutaneous edema in the abdomen particularly along the left lateral aspect.   Original Report Authenticated By: Francene Boyers, M.D.    Dg Chest Port 1 View  08/09/2012  *RADIOLOGY REPORT*  Clinical Data:   Shortness of breath.  History of COPD, diabetes and coronary artery disease.  PORTABLE CHEST - 1 VIEW  Comparison: 07/30/2012 and prior chest radiographs dating back to 12/16/2006.  Findings: Cardiomegaly and subsegmental mid - lower left lung scarring again noted. There is no evidence of focal airspace disease, pulmonary edema, suspicious pulmonary nodule/mass, pleural effusion, or pneumothorax. No acute bony abnormalities are identified. Evidence of prior lower cervical surgery again noted.  IMPRESSION: Cardiomegaly without evidence of acute cardiopulmonary disease.   Original Report Authenticated By: Harmon Pier, M.D.     I have reviewed the patient's current medications.  Assessment/Plan: Problem #1 renal failure chronic her BUN and creatinine is stable Problem #3 anasarca patient is on diuretics she is a good urine output still patient with significant  fluid overload. Problem #3 hypothyroidism Problem #4 hypertension her blood pressure is reasonably controlled Problem #5 diabetes Problem #6 after fibrillation Problem #7 metabolic bone disease calcium and phosphorus was in acceptable range Problem #8 history of COPD  Plan: Continue his present management and diuretics. Check  basic metabolic panel in the morning.   LOS: 11 days   Noor Vidales S 08/10/2012,8:14 AM

## 2012-08-10 NOTE — Progress Notes (Signed)
Subjective: She's complaining of some chest discomfort still and a sore throat. She says her ears are giving her trouble. She is somewhat nauseated.  Objective: Vital signs in last 24 hours: Temp:  [98 F (36.7 C)-98.2 F (36.8 C)] 98.2 F (36.8 C) (03/17 0611) Pulse Rate:  [71-81] 81 (03/16 2032) Resp:  [20] 20 (03/17 0611) BP: (102-130)/(66-76) 102/66 mmHg (03/17 0611) SpO2:  [91 %-96 %] 91 % (03/17 0747) Weight:  [136.76 kg (301 lb 8 oz)] 136.76 kg (301 lb 8 oz) (03/17 0611) Weight change: -2.404 kg (-5 lb 4.8 oz) Last BM Date: 08/09/12  Intake/Output from previous day: 03/16 0701 - 03/17 0700 In: 720 [P.O.:720] Out: 2200 [Urine:2200]  PHYSICAL EXAM General appearance: alert, cooperative, moderate distress and morbidly obese Resp: rhonchi bilaterally Cardio: regular rate and rhythm, S1, S2 normal, no murmur, click, rub or gallop GI: She has an obese abdomen with mild tenderness in the left lower quadrant which has improved Extremities: Her edema is better but still present.  Lab Results:    Basic Metabolic Panel:  Recent Labs  40/98/11 0644  NA 140  K 3.6  CL 99  CO2 31  GLUCOSE 147*  BUN 18  CREATININE 1.14*  CALCIUM 8.5   Liver Function Tests: No results found for this basename: AST, ALT, ALKPHOS, BILITOT, PROT, ALBUMIN,  in the last 72 hours No results found for this basename: LIPASE, AMYLASE,  in the last 72 hours No results found for this basename: AMMONIA,  in the last 72 hours CBC:  Recent Labs  08/10/12 0520  WBC 9.9  HGB 9.0*  HCT 30.1*  MCV 90.7  PLT 184   Cardiac Enzymes:  Recent Labs  08/09/12 0810 08/09/12 1420 08/09/12 1929  TROPONINI <0.30 <0.30 <0.30   BNP: No results found for this basename: PROBNP,  in the last 72 hours D-Dimer: No results found for this basename: DDIMER,  in the last 72 hours CBG:  Recent Labs  08/08/12 2024 08/09/12 0730 08/09/12 1113 08/09/12 1607 08/09/12 2027 08/10/12 0801  GLUCAP 216* 146*  223* 237* 201* 151*   Hemoglobin A1C: No results found for this basename: HGBA1C,  in the last 72 hours Fasting Lipid Panel: No results found for this basename: CHOL, HDL, LDLCALC, TRIG, CHOLHDL, LDLDIRECT,  in the last 72 hours Thyroid Function Tests: No results found for this basename: TSH, T4TOTAL, FREET4, T3FREE, THYROIDAB,  in the last 72 hours Anemia Panel: No results found for this basename: VITAMINB12, FOLATE, FERRITIN, TIBC, IRON, RETICCTPCT,  in the last 72 hours Coagulation: No results found for this basename: LABPROT, INR,  in the last 72 hours Urine Drug Screen: Drugs of Abuse  No results found for this basename: labopia, cocainscrnur, labbenz, amphetmu, thcu, labbarb    Alcohol Level: No results found for this basename: ETH,  in the last 72 hours Urinalysis: No results found for this basename: COLORURINE, APPERANCEUR, LABSPEC, PHURINE, GLUCOSEU, HGBUR, BILIRUBINUR, KETONESUR, PROTEINUR, UROBILINOGEN, NITRITE, LEUKOCYTESUR,  in the last 72 hours Misc. Labs:  ABGS No results found for this basename: PHART, PCO2, PO2ART, TCO2, HCO3,  in the last 72 hours CULTURES No results found for this or any previous visit (from the past 240 hour(s)). Studies/Results: Ct Abdomen Pelvis Wo Contrast  08/08/2012  *RADIOLOGY REPORT*  Clinical Data: Diffuse abdominal pain.  CT ABDOMEN AND PELVIS WITHOUT CONTRAST  Technique:  Multidetector CT imaging of the abdomen and pelvis was performed following the standard protocol without intravenous contrast.  Comparison: 06/16/2006  Findings: Slight  bibasilar atelectasis.  Small left effusion.  The patient has acute proximal sigmoid diverticulitis with a pericolonic phlegmon but no abscess.  There is no free air.  Slight soft tissue stranding around that area.  There is slight diffuse hepatic steatosis.  Gallbladder has been removed.  No dilated bile ducts.  Spleen, pancreas, adrenal glands, and kidneys are normal.  No dilated loops of bowel.  Uterus and  ovaries appear to have been removed.  The patient has prominent subcutaneous edema particularly along the left side of the abdomen.  Foley catheter in place.  IMPRESSION: Acute sigmoid diverticulitis with a phlegmon around that area.  No abscess or free air.  There is a soft tissue stranding around the area.  Nonspecific subcutaneous edema in the abdomen particularly along the left lateral aspect.   Original Report Authenticated By: Francene Boyers, M.D.    Dg Chest Port 1 View  08/09/2012  *RADIOLOGY REPORT*  Clinical Data:   Shortness of breath.  History of COPD, diabetes and coronary artery disease.  PORTABLE CHEST - 1 VIEW  Comparison: 07/30/2012 and prior chest radiographs dating back to 12/16/2006.  Findings: Cardiomegaly and subsegmental mid - lower left lung scarring again noted. There is no evidence of focal airspace disease, pulmonary edema, suspicious pulmonary nodule/mass, pleural effusion, or pneumothorax. No acute bony abnormalities are identified. Evidence of prior lower cervical surgery again noted.  IMPRESSION: Cardiomegaly without evidence of acute cardiopulmonary disease.   Original Report Authenticated By: Harmon Pier, M.D.     Medications:  Prior to Admission:  Prescriptions prior to admission  Medication Sig Dispense Refill  . albuterol (PROVENTIL HFA;VENTOLIN HFA) 108 (90 BASE) MCG/ACT inhaler Inhale 2 puffs into the lungs 2 (two) times daily. For shortness of breath      . ALPRAZolam (XANAX) 0.5 MG tablet Take 1 tablet (0.5 mg total) by mouth 3 (three) times daily as needed. For anxiety  5 tablet  0  . aspirin EC 81 MG tablet Take 81 mg by mouth daily.      . benzonatate (TESSALON) 100 MG capsule Take 1 capsule (100 mg total) by mouth 3 (three) times daily.  20 capsule  0  . calcitRIOL (ROCALTROL) 0.25 MCG capsule Take 1 capsule (0.25 mcg total) by mouth daily.      . carbamide peroxide (DEBROX) 6.5 % otic solution Place 5 drops into both ears 2 (two) times daily.  15 mL  0  .  digoxin (LANOXIN) 0.125 MG tablet Take 1 tablet (0.125 mg total) by mouth daily.  30 tablet  0  . esomeprazole (NEXIUM) 40 MG capsule Take 40 mg by mouth 2 (two) times daily.       . fluticasone (FLONASE) 50 MCG/ACT nasal spray Place 1 spray into the nose daily.        . Fluticasone-Salmeterol (ADVAIR) 250-50 MCG/DOSE AEPB Inhale 1 puff into the lungs every 12 (twelve) hours.        . gabapentin (NEURONTIN) 100 MG capsule Take 100 mg by mouth 3 (three) times daily.      Marland Kitchen HYDROcodone-acetaminophen (NORCO/VICODIN) 5-325 MG per tablet Take 1 tablet by mouth every 4 (four) hours as needed.  30 tablet  0  . insulin aspart (NOVOLOG) 100 UNIT/ML injection Inject 10 Units into the skin 3 (three) times daily with meals.  1 vial  0  . insulin detemir (LEVEMIR FLEXPEN) 100 UNIT/ML injection Inject 60 Units into the skin at bedtime.  10 mL  12  . ipratropium (ATROVENT) 0.02 %  nebulizer solution Take 2.5 mLs (0.5 mg total) by nebulization every 6 (six) hours.  75 mL  0  . iron polysaccharides (NIFEREX) 150 MG capsule Take 1 capsule (150 mg total) by mouth 2 (two) times daily.  30 capsule  0  . levalbuterol (XOPENEX) 0.63 MG/3ML nebulizer solution Take 3 mLs (0.63 mg total) by nebulization every 6 (six) hours.  3 mL  0  . levothyroxine (SYNTHROID, LEVOTHROID) 200 MCG tablet Take 200 mcg by mouth daily.      Marland Kitchen linagliptin (TRADJENTA) 5 MG TABS tablet Take 5 mg by mouth every morning.       . nitroGLYCERIN (NITROSTAT) 0.4 MG SL tablet Place 1 tablet (0.4 mg total) under the tongue every 5 (five) minutes as needed for chest pain.  25 tablet  3  . polyethylene glycol (MIRALAX / GLYCOLAX) packet Take 17 g by mouth daily.  14 each    . predniSONE (DELTASONE) 5 MG tablet Take 1 tablet (5 mg total) by mouth daily.  30 tablet  0  . Rivaroxaban (XARELTO) 20 MG TABS Take 20 mg by mouth daily.      . rosuvastatin (CRESTOR) 40 MG tablet Take 1 tablet (40 mg total) by mouth at bedtime.  90 tablet  3  . sertraline (ZOLOFT) 50  MG tablet Take 50 mg by mouth daily.      . sodium chloride (OCEAN) 0.65 % SOLN nasal spray Place 1 spray into the nose as needed for congestion.      Marland Kitchen tolterodine (DETROL LA) 4 MG 24 hr capsule Take 4 mg by mouth every morning.       . torsemide (DEMADEX) 20 MG tablet Take 1 tablet (20 mg total) by mouth daily.  30 tablet  0  . vitamin B-12 1000 MCG tablet Take 1 tablet (1,000 mcg total) by mouth daily.  30 tablet  0   Scheduled: . aspirin EC  81 mg Oral Daily  . atorvastatin  80 mg Oral q1800  . calcitRIOL  0.25 mcg Oral Daily  . carbamide peroxide  5 drop Both Ears BID  . ciprofloxacin  200 mg Intravenous Q12H  . diltiazem  30 mg Oral Q6H  . enoxaparin (LOVENOX) injection  120 mg Subcutaneous BID  . fesoterodine  8 mg Oral Daily  . fluticasone  1 spray Each Nare Daily  . furosemide  160 mg Intravenous BID  . gabapentin  100 mg Oral TID  . insulin aspart  0-5 Units Subcutaneous QHS  . insulin aspart  0-9 Units Subcutaneous TID WC  . insulin detemir  10 Units Subcutaneous QHS  . levalbuterol  0.63 mg Nebulization Q6H  . levothyroxine  200 mcg Oral Daily  . metronidazole  500 mg Intravenous Q8H  . mometasone-formoterol  2 puff Inhalation BID  . pantoprazole  80 mg Oral Q1200  . polyethylene glycol  17 g Oral Daily  . potassium chloride  20 mEq Oral Daily  . predniSONE  5 mg Oral Daily  . sertraline  50 mg Oral Daily  . sodium chloride  3 mL Intravenous Q12H  . torsemide  50 mg Oral Daily  . vitamin B-12  1,000 mcg Oral Daily   Continuous:  NWG:NFAOZHY carbonate (dosed in mg elemental calcium), levalbuterol, morphine injection, ondansetron (ZOFRAN) IV, ondansetron, polyvinyl alcohol  Assesment: I think she has candida esophagitis now. I think that is probably the source of her chest pain. She has diverticulitis and is going to have to be on antibiotics. She  has anasarca which is improving. She has diabetes in her blood sugar is fairly well controlled. She does have coronary  artery occlusive disease but no evidence of acute myocardial infarction. She is chronically anticoagulated. Her adrenal insufficiency is unchanged. Her cellulitis of the leg is better. Her blood pressure is doing okay. Principal Problem:   Septic shock Active Problems:   HYPOTHYROIDISM   HYPERLIPIDEMIA   HYPERTENSION   CAD   GASTROESOPHAGEAL REFLUX DISEASE   Cellulitis of leg without foot, left   Diabetes mellitus type 2 with complications   Chronic diastolic heart failure   Adrenal insufficiency   ARF (acute renal failure)   Chronic anticoagulation   Morbid obesity   Chronic kidney disease, stage III (moderate)   Atrial fibrillation   Hypotension, unspecified   UTI (urinary tract infection)   Acute on chronic diastolic heart failure   Anasarca   Chest pain, unspecified   Diverticulitis of colon (without mention of hemorrhage)    Plan: I will add Diflucan and Magic mouthwash. I think she's probably 24 hours from being ready to be transferred to a skilled care facility    LOS: 11 days   Adelia Baptista L 08/10/2012, 8:40 AM

## 2012-08-10 NOTE — Clinical Social Work Note (Signed)
Probable d/c tomorrow to [facility]. CSW will notify [facility] of need for IV antibiotics.  Derenda Fennel, [place] [phone]

## 2012-08-10 NOTE — Progress Notes (Signed)
Physical Therapy Treatment Patient Details Name: WHITNI PASQUINI MRN: [id number] DOB: 1945-07-04 Today's Date: 08/10/2012     Visit Information  Reason Eval/Treat Not Completed: Fatigue/lethargy limiting ability to participate;Medical issues which prohibited therapy    Subjective Data  Subjective: Pt snoring, difficult to arrouse. c/o malaise              Lurena Nida, PTA/CLT 08/10/2012, 11:53 AM

## 2012-08-11 ENCOUNTER — Inpatient Hospital Stay (HOSPITAL_COMMUNITY): Payer: Medicare Other

## 2012-08-11 DIAGNOSIS — R4182 Altered mental status, unspecified: Secondary | ICD-10-CM | POA: Diagnosis not present

## 2012-08-11 DIAGNOSIS — N179 Acute kidney failure, unspecified: Secondary | ICD-10-CM | POA: Diagnosis not present

## 2012-08-11 DIAGNOSIS — D649 Anemia, unspecified: Secondary | ICD-10-CM | POA: Diagnosis not present

## 2012-08-11 DIAGNOSIS — I10 Essential (primary) hypertension: Secondary | ICD-10-CM | POA: Diagnosis not present

## 2012-08-11 LAB — BLOOD GAS, ARTERIAL
Acid-Base Excess: 8.9 mmol/L — ABNORMAL HIGH (ref 0.0–2.0)
Drawn by: 23534
O2 Content: 3.5 L/min
pCO2 arterial: 57.2 mmHg (ref 35.0–45.0)
pH, Arterial: 7.391 (ref 7.350–7.450)
pO2, Arterial: 70.1 mmHg — ABNORMAL LOW (ref 80.0–100.0)

## 2012-08-11 LAB — GLUCOSE, CAPILLARY
Glucose-Capillary: 133 mg/dL — ABNORMAL HIGH (ref 70–99)
Glucose-Capillary: 151 mg/dL — ABNORMAL HIGH (ref 70–99)
Glucose-Capillary: 209 mg/dL — ABNORMAL HIGH (ref 70–99)

## 2012-08-11 LAB — BASIC METABOLIC PANEL
Chloride: 97 mEq/L (ref 96–112)
Creatinine, Ser: 1.41 mg/dL — ABNORMAL HIGH (ref 0.50–1.10)
GFR calc Af Amer: 44 mL/min — ABNORMAL LOW (ref >90)
Potassium: 3.3 mEq/L — ABNORMAL LOW (ref 3.5–5.1)

## 2012-08-11 LAB — AMMONIA: Ammonia: 23 umol/L (ref 11–60)

## 2012-08-11 MED ORDER — ACETAMINOPHEN 325 MG PO TABS
650.0000 mg | ORAL_TABLET | ORAL | Status: DC | PRN
  Administered 2012-08-11: 650 mg via ORAL
  Filled 2012-08-11: qty 2

## 2012-08-11 MED ORDER — FUROSEMIDE 10 MG/ML IJ SOLN
200.0000 mg | Freq: Two times a day (BID) | INTRAVENOUS | Status: DC
  Administered 2012-08-11 – 2012-08-12 (×3): 200 mg via INTRAVENOUS
  Filled 2012-08-11 (×3): qty 20

## 2012-08-11 MED ORDER — POTASSIUM CHLORIDE 10 MEQ/100ML IV SOLN
INTRAVENOUS | Status: AC
  Administered 2012-08-11: 10 meq via INTRAVENOUS
  Filled 2012-08-11: qty 100

## 2012-08-11 MED ORDER — POTASSIUM CHLORIDE 10 MEQ/100ML IV SOLN
10.0000 meq | INTRAVENOUS | Status: AC
  Administered 2012-08-11 (×2): 10 meq via INTRAVENOUS
  Filled 2012-08-11 (×2): qty 100

## 2012-08-11 MED ORDER — INSULIN DETEMIR 100 UNIT/ML ~~LOC~~ SOLN
10.0000 [IU] | Freq: Every day | SUBCUTANEOUS | Status: DC
  Administered 2012-08-11: 10 [IU] via SUBCUTANEOUS
  Filled 2012-08-11: qty 0.1

## 2012-08-11 NOTE — Progress Notes (Signed)
UR Chart Review Completed  

## 2012-08-11 NOTE — Progress Notes (Signed)
Upon entering patient's room, patient not responding to voice, sternal rub, VSS-BP 134/64, P 74, oxygen saturation 94%, blood sugar 133, eyelids flickering with stimulation to soles of feet, pupils reactive to light.

## 2012-08-11 NOTE — Progress Notes (Signed)
Subjective: Interval History: none.  Objective: Vital signs in last 24 hours: Temp:  [97.8 F (36.6 C)-98.2 F (36.8 C)] 97.9 F (36.6 C) (03/18 0501) Pulse Rate:  [75-93] 90 (03/18 0501) Resp:  [18-20] 20 (03/18 0501) BP: (100-128)/(66-69) 128/66 mmHg (03/18 0501) SpO2:  [91 %-93 %] 91 % (03/18 0501) Weight:  [136.261 kg (300 lb 6.4 oz)] 136.261 kg (300 lb 6.4 oz) (03/18 0501) Weight change: -0.499 kg (-1 lb 1.6 oz)  Intake/Output from previous day: 03/17 0701 - 03/18 0700 In: 340 [P.O.:340] Out: 650 [Urine:650] Intake/Output this shift:   Generally: Patient today very somnolent and very difficult to wake her up. Chest decreased breath sound, Heart exam regular rate and rhythm Extremities she has 3+ edema. Lab Results:  Recent Labs  08/10/12 0520  WBC 9.9  HGB 9.0*  HCT 30.1*  PLT 184   BMET:  Recent Labs  08/09/12 0644 08/11/12 0505  NA 140 141  K 3.6 3.3*  CL 99 97  CO2 31 33*  GLUCOSE 147* 144*  BUN 18 13  CREATININE 1.14* 1.41*  CALCIUM 8.5 8.2*   No results found for this basename: PTH,  in the last 72 hours Iron Studies: No results found for this basename: IRON, TIBC, TRANSFERRIN, FERRITIN,  in the last 72 hours  Studies/Results: Dg Chest Port 1 View  08/10/2012  *RADIOLOGY REPORT*  Clinical Data: Confirm PICC line placement  PORTABLE CHEST - 1 VIEW  Comparison: 08/09/2012  Findings: Cardiomegaly with pulmonary vascular congestion.  No frank interstitial edema.  No pleural effusion or pneumothorax.  Left arm PICC terminates at the cavoatrial junction.  Cervical spine fixation hardware.  IMPRESSION: Left arm PICC terminates at the cavoatrial junction.  No pneumothorax.   Original Report Authenticated By: Charline Bills, M.D.    Dg Chest Port 1 View  08/09/2012  *RADIOLOGY REPORT*  Clinical Data:   Shortness of breath.  History of COPD, diabetes and coronary artery disease.  PORTABLE CHEST - 1 VIEW  Comparison: 07/30/2012 and prior chest radiographs  dating back to 12/16/2006.  Findings: Cardiomegaly and subsegmental mid - lower left lung scarring again noted. There is no evidence of focal airspace disease, pulmonary edema, suspicious pulmonary nodule/mass, pleural effusion, or pneumothorax. No acute bony abnormalities are identified. Evidence of prior lower cervical surgery again noted.  IMPRESSION: Cardiomegaly without evidence of acute cardiopulmonary disease.   Original Report Authenticated By: Harmon Pier, M.D.     I have reviewed the patient's current medications.  Assessment/Plan: Assessment renal failure chronic her kidney 13 creatinine is 1.41 renal function stable Problem #2 anasarca she is on diuretics she is a reasonable urine output. Presently patient seems to be requiring a very high dose of Lasix. Still she has significant anasarca. Problem #3 altered mental status at this moment not clear why patient is very somnolent. O2 saturation is good and her blood sugar is normal. Problem #4 hypothyroidism Problem #5 hypertension her blood pressure seems to be reasonably controlled Problem #6 cellulitis Problem #7 morbid obesity Problem #8 adrenal insufficiency. Plan: We'll increase her Lasix to 200 mg IV twice a day Presently does not seem respond to to oral diuretics very well and patient previously did want to do dialysis and hence once she is able to take oral medication will switch her to [facility]. We'll continue to advise her to continue her fluid and salt intake.     LOS: 12 days   Caroly Purewal S 08/11/2012,7:36 AM

## 2012-08-12 ENCOUNTER — Inpatient Hospital Stay
Admission: RE | Admit: 2012-08-12 | Discharge: 2012-09-14 | Disposition: A | Discharge status: Nursing Home (Includes ICF) | Source: Ambulatory Visit | Attending: Internal Medicine | Admitting: Internal Medicine

## 2012-08-12 DIAGNOSIS — L02419 Cutaneous abscess of limb, unspecified: Secondary | ICD-10-CM | POA: Diagnosis not present

## 2012-08-12 DIAGNOSIS — R112 Nausea with vomiting, unspecified: Secondary | ICD-10-CM | POA: Diagnosis not present

## 2012-08-12 DIAGNOSIS — N1330 Unspecified hydronephrosis: Secondary | ICD-10-CM | POA: Diagnosis not present

## 2012-08-12 DIAGNOSIS — K2970 Gastritis, unspecified, without bleeding: Secondary | ICD-10-CM | POA: Diagnosis present

## 2012-08-12 DIAGNOSIS — R1115 Cyclical vomiting syndrome unrelated to migraine: Secondary | ICD-10-CM | POA: Diagnosis not present

## 2012-08-12 DIAGNOSIS — R1011 Right upper quadrant pain: Secondary | ICD-10-CM | POA: Diagnosis not present

## 2012-08-12 DIAGNOSIS — K625 Hemorrhage of anus and rectum: Secondary | ICD-10-CM | POA: Diagnosis not present

## 2012-08-12 DIAGNOSIS — I4891 Unspecified atrial fibrillation: Secondary | ICD-10-CM | POA: Diagnosis present

## 2012-08-12 DIAGNOSIS — D126 Benign neoplasm of colon, unspecified: Secondary | ICD-10-CM | POA: Diagnosis present

## 2012-08-12 DIAGNOSIS — N183 Chronic kidney disease, stage 3 unspecified: Secondary | ICD-10-CM | POA: Diagnosis not present

## 2012-08-12 DIAGNOSIS — I2510 Atherosclerotic heart disease of native coronary artery without angina pectoris: Secondary | ICD-10-CM | POA: Diagnosis not present

## 2012-08-12 DIAGNOSIS — J9610 Chronic respiratory failure, unspecified whether with hypoxia or hypercapnia: Secondary | ICD-10-CM | POA: Diagnosis not present

## 2012-08-12 DIAGNOSIS — E2749 Other adrenocortical insufficiency: Secondary | ICD-10-CM | POA: Diagnosis present

## 2012-08-12 DIAGNOSIS — J441 Chronic obstructive pulmonary disease with (acute) exacerbation: Secondary | ICD-10-CM | POA: Diagnosis not present

## 2012-08-12 DIAGNOSIS — J449 Chronic obstructive pulmonary disease, unspecified: Secondary | ICD-10-CM | POA: Diagnosis not present

## 2012-08-12 DIAGNOSIS — E1142 Type 2 diabetes mellitus with diabetic polyneuropathy: Secondary | ICD-10-CM | POA: Diagnosis present

## 2012-08-12 DIAGNOSIS — I509 Heart failure, unspecified: Secondary | ICD-10-CM | POA: Diagnosis not present

## 2012-08-12 DIAGNOSIS — I5032 Chronic diastolic (congestive) heart failure: Secondary | ICD-10-CM | POA: Diagnosis not present

## 2012-08-12 DIAGNOSIS — E876 Hypokalemia: Secondary | ICD-10-CM | POA: Diagnosis not present

## 2012-08-12 DIAGNOSIS — D649 Anemia, unspecified: Secondary | ICD-10-CM | POA: Diagnosis not present

## 2012-08-12 DIAGNOSIS — N179 Acute kidney failure, unspecified: Secondary | ICD-10-CM | POA: Diagnosis not present

## 2012-08-12 DIAGNOSIS — Z6841 Body Mass Index (BMI) 40.0 and over, adult: Secondary | ICD-10-CM | POA: Diagnosis not present

## 2012-08-12 DIAGNOSIS — E039 Hypothyroidism, unspecified: Secondary | ICD-10-CM | POA: Diagnosis present

## 2012-08-12 DIAGNOSIS — K5732 Diverticulitis of large intestine without perforation or abscess without bleeding: Secondary | ICD-10-CM | POA: Diagnosis not present

## 2012-08-12 DIAGNOSIS — J4489 Other specified chronic obstructive pulmonary disease: Secondary | ICD-10-CM | POA: Diagnosis not present

## 2012-08-12 DIAGNOSIS — K3184 Gastroparesis: Secondary | ICD-10-CM | POA: Diagnosis present

## 2012-08-12 DIAGNOSIS — F329 Major depressive disorder, single episode, unspecified: Secondary | ICD-10-CM | POA: Diagnosis not present

## 2012-08-12 DIAGNOSIS — E1149 Type 2 diabetes mellitus with other diabetic neurological complication: Secondary | ICD-10-CM | POA: Diagnosis present

## 2012-08-12 DIAGNOSIS — R7989 Other specified abnormal findings of blood chemistry: Secondary | ICD-10-CM | POA: Diagnosis not present

## 2012-08-12 DIAGNOSIS — K219 Gastro-esophageal reflux disease without esophagitis: Secondary | ICD-10-CM | POA: Diagnosis not present

## 2012-08-12 DIAGNOSIS — G8929 Other chronic pain: Secondary | ICD-10-CM | POA: Diagnosis present

## 2012-08-12 DIAGNOSIS — F411 Generalized anxiety disorder: Secondary | ICD-10-CM | POA: Diagnosis present

## 2012-08-12 DIAGNOSIS — L03119 Cellulitis of unspecified part of limb: Secondary | ICD-10-CM | POA: Diagnosis not present

## 2012-08-12 DIAGNOSIS — E785 Hyperlipidemia, unspecified: Secondary | ICD-10-CM | POA: Diagnosis present

## 2012-08-12 DIAGNOSIS — I5033 Acute on chronic diastolic (congestive) heart failure: Secondary | ICD-10-CM | POA: Diagnosis not present

## 2012-08-12 DIAGNOSIS — H409 Unspecified glaucoma: Secondary | ICD-10-CM | POA: Diagnosis present

## 2012-08-12 DIAGNOSIS — I129 Hypertensive chronic kidney disease with stage 1 through stage 4 chronic kidney disease, or unspecified chronic kidney disease: Secondary | ICD-10-CM | POA: Diagnosis present

## 2012-08-12 DIAGNOSIS — M549 Dorsalgia, unspecified: Secondary | ICD-10-CM | POA: Diagnosis present

## 2012-08-12 DIAGNOSIS — N390 Urinary tract infection, site not specified: Secondary | ICD-10-CM | POA: Diagnosis not present

## 2012-08-12 DIAGNOSIS — R748 Abnormal levels of other serum enzymes: Secondary | ICD-10-CM | POA: Diagnosis not present

## 2012-08-12 DIAGNOSIS — M1990 Unspecified osteoarthritis, unspecified site: Secondary | ICD-10-CM | POA: Diagnosis present

## 2012-08-12 LAB — BASIC METABOLIC PANEL
BUN: 12 mg/dL (ref 6–23)
CO2: 34 mEq/L — ABNORMAL HIGH (ref 19–32)
Calcium: 7.9 mg/dL — ABNORMAL LOW (ref 8.4–10.5)
Chloride: 97 mEq/L (ref 96–112)
Creatinine, Ser: 1.51 mg/dL — ABNORMAL HIGH (ref 0.50–1.10)
Glucose, Bld: 158 mg/dL — ABNORMAL HIGH (ref 70–99)

## 2012-08-12 LAB — CBC
HCT: 28.4 % — ABNORMAL LOW (ref 36.0–46.0)
Hemoglobin: 8.6 g/dL — ABNORMAL LOW (ref 12.0–15.0)
MCH: 27.5 pg (ref 26.0–34.0)
MCV: 90.7 fL (ref 78.0–100.0)
RBC: 3.13 MIL/uL — ABNORMAL LOW (ref 3.87–5.11)

## 2012-08-12 LAB — GLUCOSE, CAPILLARY: Glucose-Capillary: 145 mg/dL — ABNORMAL HIGH (ref 70–99)

## 2012-08-12 NOTE — Progress Notes (Signed)
Ann [name]  MRN: [id number]  DOB/AGE: July 14, 1945 67 y.o.  Primary Care Physician:HAWKINS,EDWARD L, MD  Admit date: 07/30/2012  Chief Complaint:  Chief Complaint  Patient presents with  . Weakness    S-Pt presented on  07/30/2012 with  Chief Complaint  Patient presents with  . Weakness  . Pt offers no new complaints.   Meds . aspirin EC  81 mg Oral Daily  . atorvastatin  80 mg Oral q1800  . calcitRIOL  0.25 mcg Oral Daily  . carbamide peroxide  5 drop Both Ears BID  . ciprofloxacin  200 mg Intravenous Q12H  . diltiazem  30 mg Oral Q6H  . enoxaparin (LOVENOX) injection  120 mg Subcutaneous BID  . fesoterodine  8 mg Oral Daily  . fluconazole  100 mg Oral Daily  . fluticasone  1 spray Each Nare Daily  . furosemide  200 mg Intravenous BID  . gabapentin  100 mg Oral TID  . insulin aspart  0-5 Units Subcutaneous QHS  . insulin aspart  0-9 Units Subcutaneous TID WC  . insulin detemir  10 Units Subcutaneous QHS  . levalbuterol  0.63 mg Nebulization Q6H  . levothyroxine  200 mcg Oral Daily  . magic mouthwash  10 mL Oral TID PC & HS  . metronidazole  500 mg Intravenous Q8H  . mometasone-formoterol  2 puff Inhalation BID  . pantoprazole  80 mg Oral Q1200  . polyethylene glycol  17 g Oral Daily  . predniSONE  5 mg Oral Daily  . sertraline  50 mg Oral Daily  . sodium chloride  10-40 mL Intracatheter Q12H  . sodium chloride  3 mL Intravenous Q12H  . torsemide  50 mg Oral Daily  . vitamin B-12  1,000 mcg Oral Daily      Physical Exam: Vital signs in last 24 hours: Temp:  [98.3 F (36.8 C)] 98.3 F (36.8 C) (03/18 2300) Pulse Rate:  [78] 78 (03/18 2300) Resp:  [20] 20 (03/18 2300) BP: (99-130)/(52-54) 130/52 mmHg (03/18 2300) SpO2:  [93 %-95 %] 95 % (03/18 2300) Weight:  [302 lb 11.2 oz (137.304 kg)] 302 lb 11.2 oz (137.304 kg) (03/19 0500) Weight change: 2 lb 4.8 oz (1.043 kg) Last BM Date: 08/11/12  Intake/Output from previous day: 03/18 0701 - 03/19 0700 In:  360 [P.O.:360] Out: 1502 [Urine:1500; Stool:2]     Physical Exam: General- pt is awake,alert, follows comands Resp- No acute Resp distress, Decreased BS at bases. CVS- S1S2 regular in rate and rhythm-distant GIT- BS+, soft, NT, ND, Morbidly obese EXT- 1+ LE Edema, NO Cyanosis               Lab Results: CBC  Recent Labs  08/10/12 0520 08/12/12 0438  WBC 9.9 10.5  HGB 9.0* 8.6*  HCT 30.1* 28.4*  PLT 184 196    BMET  Recent Labs  08/11/12 0505 08/12/12 0438  NA 141 140  K 3.3* 3.2*  CL 97 97  CO2 33* 34*  GLUCOSE 144* 158*  BUN 13 12  CREATININE 1.41* 1.51*  CALCIUM 8.2* 7.9*   Creat Trend 2014 3.62==>2.46==>1.59==>1.5==>1.3==>1.5          2.4==>1.04 ( Last admission) 2013 0.8--1.0 2012 0.9--1.13    MICRO No results found for this or any previous visit (from the past 240 hour(s)).    Lab Results  Component Value Date   PTH 177.0* 06/28/2012   CALCIUM 7.9* 08/12/2012   PHOS 2.7 08/04/2012    CT abdo IMPRESSION:  Acute sigmoid diverticulitis with a phlegmon around that area. No  abscess or free air. There is a soft tissue stranding around the  area.   2d echo Left ventricle: The cavity size was moderately reduced. Mild to moderate LVH. Systolic function was hyperdynamic. - Right ventricle: The cavity size was moderately decreased. Wall thickness was increased.  Impression: 1)Renal  AKI secondary to Prerenal/ ATn AKI most likely Non Oliguric ATN/Cardiorenal  Creat now at 1.3 AKi better  2)HTN  Target Organ damage  CAD  Medication- On Diuretics On Calcium channel blockers   3)Anemia HGb NOt at goal (9--11) Iron deficincy    4)CKD Mineral-Bone Disorder PTH elevated. Secondary Hyperparathyroidism  Present- on calcitriol Phosphorus at goal.   5)ID- CT showed Diverticulitis On Cipro + Metro  6)FEN  Hypokalemic-being replaced                     Sec to Duiuretics NOrmonatremic    7)Acid base Co2 at goal  8) Anasarca-   Pt on IV Lasix   Plan:  Agree with current tx and plan.     Niralya Ohanian S 08/12/2012, 11:00 AM

## 2012-08-12 NOTE — Clinical Social Work Placement (Signed)
Clinical Social Work Department CLINICAL SOCIAL WORK PLACEMENT NOTE 08/12/2012  Patient:  Ann [name], Ann [name]  Account Number:  [id number] Admit date:  07/30/2012  Clinical Social Worker:  Derenda Fennel, LCSW  Date/time:  08/06/2012 02:20 PM  Clinical Social Work is seeking post-discharge placement for this patient at the following level of care:   SKILLED NURSING   (*CSW will update this form in Epic as items are completed)   08/06/2012  Patient/family provided with [facility] Department of Clinical Social Work's list of facilities offering this level of care within the geographic area requested by the patient (or if unable, by the patient's family).  08/06/2012  Patient/family informed of their freedom to choose among providers that offer the needed level of care, that participate in Medicare, Medicaid or managed care program needed by the patient, have an available bed and are willing to accept the patient.  08/06/2012  Patient/family informed of [facility]' ownership interest in [facility], as well as of the fact that they are under no obligation to receive care at this facility.  PASARR submitted to EDS on  PASARR number received from EDS on   FL2 transmitted to all facilities in geographic area requested by pt/family on  08/06/2012 FL2 transmitted to all facilities within larger geographic area on   Patient informed that his/her managed care company has contracts with or will negotiate with  certain facilities, including the following:     Patient/family informed of bed offers received:  08/06/2012 Patient chooses bed at [facility] Physician recommends and patient chooses bed at  [facility]  Patient to be transferred to [facility] on  08/12/2012 Patient to be transferred to facility by RN  The following physician request were entered in Epic:   Additional Comments: existing pasarr.  Derenda Fennel, [place] [phone]

## 2012-08-12 NOTE — Discharge Summary (Signed)
Physician Discharge Summary  Patient ID: Ann [name] MRN: [id number] DOB/AGE: [birth year] 67 y.o. Primary Care Physician:Trixie Maclaren L, MD Admit date: 07/30/2012 Discharge date: 08/12/2012    Discharge Diagnoses:   Principal Problem:   Septic shock Active Problems:   HYPOTHYROIDISM   HYPERLIPIDEMIA   HYPERTENSION   CAD   GASTROESOPHAGEAL REFLUX DISEASE   Cellulitis of leg without foot, left   Diabetes mellitus type 2 with complications   Chronic diastolic heart failure   Adrenal insufficiency   ARF (acute renal failure)   Chronic anticoagulation   Morbid obesity   Chronic kidney disease, stage III (moderate)   Atrial fibrillation   Hypotension, unspecified   UTI (urinary tract infection)   Acute on chronic diastolic heart failure   Anasarca   Chest pain, unspecified   Diverticulitis of colon (without mention of hemorrhage)  Candida esophagitis    Medication List    ASK your doctor about these medications       albuterol 108 (90 BASE) MCG/ACT inhaler  Commonly known as:  PROVENTIL HFA;VENTOLIN HFA  Inhale 2 puffs into the lungs 2 (two) times daily. For shortness of breath     ALPRAZolam 0.5 MG tablet  Commonly known as:  XANAX  Take 1 tablet (0.5 mg total) by mouth 3 (three) times daily as needed. For anxiety     aspirin EC 81 MG tablet  Take 81 mg by mouth daily.     benzonatate 100 MG capsule  Commonly known as:  TESSALON  Take 1 capsule (100 mg total) by mouth 3 (three) times daily.     calcitRIOL 0.25 MCG capsule  Commonly known as:  ROCALTROL  Take 1 capsule (0.25 mcg total) by mouth daily.     carbamide peroxide 6.5 % otic solution  Commonly known as:  DEBROX  Place 5 drops into both ears 2 (two) times daily.     cyanocobalamin 1000 MCG tablet  Take 1 tablet (1,000 mcg total) by mouth daily.     digoxin 0.125 MG tablet  Commonly known as:  LANOXIN  Take 1 tablet (0.125 mg total) by mouth daily.     esomeprazole 40 MG capsule  Commonly  known as:  NEXIUM  Take 40 mg by mouth 2 (two) times daily.     fluticasone 50 MCG/ACT nasal spray  Commonly known as:  FLONASE  Place 1 spray into the nose daily.     Fluticasone-Salmeterol 250-50 MCG/DOSE Aepb  Commonly known as:  ADVAIR  Inhale 1 puff into the lungs every 12 (twelve) hours.     gabapentin 100 MG capsule  Commonly known as:  NEURONTIN  Take 100 mg by mouth 3 (three) times daily.     HYDROcodone-acetaminophen 5-325 MG per tablet  Commonly known as:  NORCO/VICODIN  Take 1 tablet by mouth every 4 (four) hours as needed.     insulin aspart 100 UNIT/ML injection  Commonly known as:  novoLOG  Inject 10 Units into the skin 3 (three) times daily with meals.     insulin detemir 100 UNIT/ML injection  Commonly known as:  LEVEMIR FLEXPEN  Inject 60 Units into the skin at bedtime.     ipratropium 0.02 % nebulizer solution  Commonly known as:  ATROVENT  Take 2.5 mLs (0.5 mg total) by nebulization every 6 (six) hours.     iron polysaccharides 150 MG capsule  Commonly known as:  NIFEREX  Take 1 capsule (150 mg total) by mouth 2 (two) times daily.  levalbuterol 0.63 MG/3ML nebulizer solution  Commonly known as:  XOPENEX  Take 3 mLs (0.63 mg total) by nebulization every 6 (six) hours.     levothyroxine 200 MCG tablet  Commonly known as:  SYNTHROID, LEVOTHROID  Take 200 mcg by mouth daily.     linagliptin 5 MG Tabs tablet  Commonly known as:  TRADJENTA  Take 5 mg by mouth every morning.     nitroGLYCERIN 0.4 MG SL tablet  Commonly known as:  NITROSTAT  Place 1 tablet (0.4 mg total) under the tongue every 5 (five) minutes as needed for chest pain.     polyethylene glycol packet  Commonly known as:  MIRALAX / GLYCOLAX  Take 17 g by mouth daily.     predniSONE 5 MG tablet  Commonly known as:  DELTASONE  Take 1 tablet (5 mg total) by mouth daily.     rosuvastatin 40 MG tablet  Commonly known as:  CRESTOR  Take 1 tablet (40 mg total) by mouth at bedtime.      sertraline 50 MG tablet  Commonly known as:  ZOLOFT  Take 50 mg by mouth daily.     sodium chloride 0.65 % Soln nasal spray  Commonly known as:  OCEAN  Place 1 spray into the nose as needed for congestion.     tolterodine 4 MG 24 hr capsule  Commonly known as:  DETROL LA  Take 4 mg by mouth every morning.     torsemide 20 MG tablet  Commonly known as:  DEMADEX  Take 1 tablet (20 mg total) by mouth daily.     XARELTO 20 MG Tabs  Generic drug:  Rivaroxaban  Take 20 mg by mouth daily.        Discharged Condition: Improved    Consults: Nephrology  Significant Diagnostic Studies: Ct Abdomen Pelvis Wo Contrast  08/08/2012  *RADIOLOGY REPORT*  Clinical Data: Diffuse abdominal pain.  CT ABDOMEN AND PELVIS WITHOUT CONTRAST  Technique:  Multidetector CT imaging of the abdomen and pelvis was performed following the standard protocol without intravenous contrast.  Comparison: 06/16/2006  Findings: Slight bibasilar atelectasis.  Small left effusion.  The patient has acute proximal sigmoid diverticulitis with a pericolonic phlegmon but no abscess.  There is no free air.  Slight soft tissue stranding around that area.  There is slight diffuse hepatic steatosis.  Gallbladder has been removed.  No dilated bile ducts.  Spleen, pancreas, adrenal glands, and kidneys are normal.  No dilated loops of bowel.  Uterus and ovaries appear to have been removed.  The patient has prominent subcutaneous edema particularly along the left side of the abdomen.  Foley catheter in place.  IMPRESSION: Acute sigmoid diverticulitis with a phlegmon around that area.  No abscess or free air.  There is a soft tissue stranding around the area.  Nonspecific subcutaneous edema in the abdomen particularly along the left lateral aspect.   Original Report Authenticated By: Francene Boyers, M.D.    Ct Head Wo Contrast  08/11/2012  *RADIOLOGY REPORT*  Clinical Data: Altered mental status  CT HEAD WITHOUT CONTRAST  Technique:   Contiguous axial images were obtained from the base of the skull through the vertex without contrast.  Comparison: Brain CT June 27, 2012 and brain MRI June 29, 2012  Findings: There is mild diffuse atrophy, stable.  There is no mass, hemorrhage, extra-axial fluid collection, or midline shift.  Mild small vessel disease in the centra semiovale bilaterally remains stable.  There is no new gray-white  compartment lesions.  There is no evidence of acute appearing infarct.  Bony calvarium appears intact.  Diffuse opacification of the mastoids bilaterally remains.  Ethmoid air cells now appear clear.  IMPRESSION: Mild atrophy and mild small vessel disease, stable.  No intracranial mass, hemorrhage, or acute appearing infarct.  Diffuse mastoid disease is again noted bilaterally.   Original Report Authenticated By: Bretta Bang, M.D.    Dg Chest Port 1 View  08/10/2012  *RADIOLOGY REPORT*  Clinical Data: Confirm PICC line placement  PORTABLE CHEST - 1 VIEW  Comparison: 08/09/2012  Findings: Cardiomegaly with pulmonary vascular congestion.  No frank interstitial edema.  No pleural effusion or pneumothorax.  Left arm PICC terminates at the cavoatrial junction.  Cervical spine fixation hardware.  IMPRESSION: Left arm PICC terminates at the cavoatrial junction.  No pneumothorax.   Original Report Authenticated By: Charline Bills, M.D.    Dg Chest Port 1 View  08/09/2012  *RADIOLOGY REPORT*  Clinical Data:   Shortness of breath.  History of COPD, diabetes and coronary artery disease.  PORTABLE CHEST - 1 VIEW  Comparison: 07/30/2012 and prior chest radiographs dating back to 12/16/2006.  Findings: Cardiomegaly and subsegmental mid - lower left lung scarring again noted. There is no evidence of focal airspace disease, pulmonary edema, suspicious pulmonary nodule/mass, pleural effusion, or pneumothorax. No acute bony abnormalities are identified. Evidence of prior lower cervical surgery again noted.  IMPRESSION:  Cardiomegaly without evidence of acute cardiopulmonary disease.   Original Report Authenticated By: Harmon Pier, M.D.    Dg Chest Port 1 View  07/30/2012  *RADIOLOGY REPORT*  Clinical Data: Shortness of breath, weakness and hypertension.  PORTABLE CHEST - 1 VIEW  Comparison: Chest x-ray 07/03/2012.  Findings: Film is severely underpenetrated related to the patient's large body habitus.  With this limitation in mind, there is no definite acute consolidative airspace disease, and no definite pleural effusion.  Minimal bibasilar opacities are most compatible with areas of subsegmental atelectasis and/or scarring, and/or similar to prior studies.  Prominence of the central pulmonary arteries suggestive of pulmonary arterial hypertension.  No evidence of pulmonary edema.  Heart size is mildly enlarged. Orthopedic fixation hardware in the lower cervical spine is incidentally noted.  IMPRESSION: 1.  No definite radiographic evidence of acute cardiopulmonary disease. 2.  Cardiomegaly and dilatation of the central pulmonary arteries are demonstrated, suggestive of pulmonary arterial hypertension.   Original Report Authenticated By: Trudie Reed, M.D.     Lab Results: Basic Metabolic Panel:  Recent Labs  16/10/96 0505 08/12/12 0438  NA 141 140  K 3.3* 3.2*  CL 97 97  CO2 33* 34*  GLUCOSE 144* 158*  BUN 13 12  CREATININE 1.41* 1.51*  CALCIUM 8.2* 7.9*   Liver Function Tests: No results found for this basename: AST, ALT, ALKPHOS, BILITOT, PROT, ALBUMIN,  in the last 72 hours   CBC:  Recent Labs  08/10/12 0520 08/12/12 0438  WBC 9.9 10.5  HGB 9.0* 8.6*  HCT 30.1* 28.4*  MCV 90.7 90.7  PLT 184 196    No results found for this or any previous visit (from the past 240 hour(s)).   Hospital Course: She was admitted with cellulitis of the left leg and appeared to be septic. She was started on intravenous antibiotics and fluids and improved. She developed anasarca. She also developed acute  renal failure. She was found to have a urinary tract infection as well. She was given intravenous antibiotics she was initially in the intensive care unit and  then transferred out. Her renal function improved. Her anasarca was treated with diuretics and improved. She then developed abdominal discomfort and was found to have diverticulitis. This was treated with intravenous antibiotics. She then developed problems with her mouth and painful swallowing and she had codeine of her mouth with Candida it was presumed that she also had Candida esophagitis. It was clear that she was not going to be able to be managed at home directly and I offered hospice care because I think she is probably appropriate for hospice considering her multiple medical problems and multiple hospitalizations in the last year. She and her husband did not wish to consider hospice care but do want to consider skilled care facility treatment for rehabilitation.  Discharge Exam: Blood pressure 130/52, pulse 78, temperature 98.3 F (36.8 C), temperature source Oral, resp. rate 20, height 5\' 4"  (1.626 m), weight 137.304 kg (302 lb 11.2 oz), SpO2 95.00%. She is morbidly obese. She still has coating of her mouth. She is on oxygen. Her chest shows some rhonchi. Her heart is regular. Her abdomen is soft obese without masses and with mild left lower quadrant tenderness. She still has edema of the extremities which is about 2+ but it is much improved over previously.  Disposition: To skilled care facility for rehabilitation. She will need speech evaluation for her swallowing PT and OT. She will need intensive management of her CHF. She will need to have a basic metabolic profile on 08/14/2012 and on   08/17/2012. She should initially be on a pured diet carb modified and tell her swallowing improves from the Candida esophagitis. She has a PICC line in place. She will be on oxygen to maintain oxygen concentration greater than 90% currently on 3 L Her  medications are as follows: Tylenol 650 mg by mouth every 4 hours when necessary mild pain Aspirin 81 mg daily Atorvastatin 80 mg daily Rocaltrol 0.25 mcg daily Calcium carbonate suspension 500 mg every 6 hours as needed for indigestion Cipro 200 mg IV every 12 hours for 4 more days Diltiazem CD 120 daily Lovenox 120 mg subcutaneously 2 times daily Toviaz 8 mg daily Diflucan 100 mg daily x10 more days Flonase one spray each nostril daily Lasix 40 mg IV twice a day Gabapentin 100 mg 3 times daily She should have Accu-Cheks a.c. and at bedtime and facility protocol sliding scale insulin resistant Levemir 10 units daily Xopenex 0.63 mg every 6 hours by nebulizer Synthroid 200 mcg daily Magic mouthwash 10 cc 3 times a day for 10 more days Flagyl 500 mg every 8 hours x4 more days Zofran 4 mg every 6 hours when necessary nausea Pantoprazole 80 mg daily MiraLAX 17 g daily Prednisone 5 mg daily Potassium chloride 20 mEq twice a day Vitamin B 12 1000 mcg daily She should have daily weights and if her weight starts going up she will need adjustment in her Lasix dose Norco 10/325 every 4 hours when necessary pain     Signed: Brehanna Deveny L Pager [phone]  08/12/2012, 9:02 AM

## 2012-08-12 NOTE — Clinical Social Work Note (Signed)
Pt d/c today to [facility]. Pt, pt's family, and facility are aware and agreeable. Pt to transfer with RN. D/C summary and updated FL2 faxed. Pt to continue IV antibiotics at SNF and has PICC.   Derenda Fennel, [place] [phone]

## 2012-08-12 NOTE — Progress Notes (Signed)
Physical Therapy Treatment Patient Details Name: Ann [name] MRN: [id number] DOB: [birth year] Today's Date: 08/12/2012 Time: 0865-7846 PT Time Calculation (min): 36 min  PT Assessment / Plan / Recommendation Comments on Treatment Session  Pt limited by fatigue with activity, increased time required with bed mobility this session.  Pt able to follow cues for safe mechanics for hand placements.  Pt would benefit from SNF to improve overall bed mobility, strength and activity tolerance to reduce risk of injury.    Follow Up Recommendations        Does the patient have the potential to tolerate intense rehabilitation     Barriers to Discharge        Equipment Recommendations       Recommendations for Other Services    Frequency     Plan      Precautions / Restrictions Precautions Precautions: Fall Restrictions Weight Bearing Restrictions: No   Pertinent Vitals/Pain     Mobility  Bed Mobility Bed Mobility: Rolling Right;Rolling Left;Right Sidelying to Sit Rolling Right: 4: Min guard;With rail Rolling Left: 4: Min guard;With rail Right Sidelying to Sit: 4: Min assist;With rails;HOB elevated Transfers Transfers: Sit to Stand;Stand to Sit Sit to Stand: 4: Min guard;From bed Stand to Sit: 5: Supervision;To chair/3-in-1 Ambulation/Gait Ambulation/Gait Assistance: 4: Min guard Ambulation Distance (Feet): 6 Feet Assistive device: Rolling walker Gait Pattern: Within Functional Limits Gait velocity: slow and cautious Stairs: No Wheelchair Mobility Wheelchair Mobility: No    Exercises General Exercises - Lower Extremity Ankle Circles/Pumps: AROM;Both;10 reps;Supine Long Arc Quad: AROM;Both;10 reps;Seated Heel Slides: AROM;Both;10 reps;Supine Hip ABduction/ADduction: AROM;Both;10 reps;Supine Other Exercises Other Exercises: bridges x 10   PT Diagnosis:    PT Problem List:   PT Treatment Interventions:     PT Goals Acute Rehab PT Goals PT Goal: Supine/Side to Sit -  Progress: Progressing toward goal PT Goal: Sit to Stand - Progress: Progressing toward goal PT Transfer Goal: Bed to Chair/Chair to Bed - Progress: Progressing toward goal PT Goal: Ambulate - Progress: Progressing toward goal  Visit Information  Last PT Received On: 08/12/12    Subjective Data  Subjective: Pt stated feeling better today, pt c/o sore throat    Cognition  Cognition Overall Cognitive Status: Appears within functional limits for tasks assessed/performed Arousal/Alertness: Awake/alert Orientation Level: Appears intact for tasks assessed Behavior During Session: [facility] for tasks performed    Balance     End of Session PT - End of Session Equipment Utilized During Treatment: Gait belt Activity Tolerance: Patient limited by fatigue Patient left: in chair;with chair alarm set;with family/visitor present;with call bell/phone within reach Nurse Communication: Mobility status   GP     Juel Burrow 08/12/2012, 9:07 AM

## 2012-08-12 NOTE — Plan of Care (Signed)
Problem: Phase I Progression Outcomes Goal: Voiding-avoid urinary catheter unless indicated Outcome: Not Progressing Order to leave foley in place.  Problem: Phase II Progression Outcomes Goal: Obtain order to discontinue catheter if appropriate Outcome: Not Progressing Order to leave foley in  Problem: Phase III Progression Outcomes Goal: IV/normal saline lock discontinued Outcome: Not Progressing Patient discharged with PICC to receive IV abx

## 2012-08-12 NOTE — Progress Notes (Signed)
Patient transferred to [facility].  Report called and given to [facility].  Order from MD to leave foley in for output monitoring.  PICC line left in place.  Patient dressed in own clothing and belongings sent with patient  Instructed nursing facility that patient will need to be weighed daily.  Patient stable for discharge at this time.  Packet sent with patient.

## 2012-08-13 LAB — GLUCOSE, CAPILLARY
Glucose-Capillary: 231 mg/dL — ABNORMAL HIGH (ref 70–99)
Glucose-Capillary: 121 mg/dL — ABNORMAL HIGH (ref 70–99)
Glucose-Capillary: 213 mg/dL — ABNORMAL HIGH (ref 70–99)

## 2012-08-15 DIAGNOSIS — I509 Heart failure, unspecified: Secondary | ICD-10-CM | POA: Diagnosis not present

## 2012-08-15 DIAGNOSIS — J449 Chronic obstructive pulmonary disease, unspecified: Secondary | ICD-10-CM | POA: Diagnosis not present

## 2012-08-15 LAB — GLUCOSE, CAPILLARY
Glucose-Capillary: 127 mg/dL — ABNORMAL HIGH (ref 70–99)
Glucose-Capillary: 198 mg/dL — ABNORMAL HIGH (ref 70–99)
Glucose-Capillary: 195 mg/dL — ABNORMAL HIGH (ref 70–99)
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
## 2012-08-17 LAB — GLUCOSE, CAPILLARY
Glucose-Capillary: 137 mg/dL — ABNORMAL HIGH (ref 70–99)
Glucose-Capillary: 203 mg/dL — ABNORMAL HIGH (ref 70–99)
Glucose-Capillary: 179 mg/dL — ABNORMAL HIGH (ref 70–99)

## 2012-08-18 LAB — GLUCOSE, CAPILLARY: Glucose-Capillary: 98 mg/dL (ref 70–99)

## 2012-08-19 ENCOUNTER — Non-Acute Institutional Stay (SKILLED_NURSING_FACILITY): Payer: Medicare Other | Admitting: Internal Medicine

## 2012-08-19 DIAGNOSIS — J441 Chronic obstructive pulmonary disease with (acute) exacerbation: Secondary | ICD-10-CM | POA: Diagnosis not present

## 2012-08-19 DIAGNOSIS — R601 Generalized edema: Secondary | ICD-10-CM

## 2012-08-19 DIAGNOSIS — N189 Chronic kidney disease, unspecified: Secondary | ICD-10-CM

## 2012-08-19 DIAGNOSIS — J9610 Chronic respiratory failure, unspecified whether with hypoxia or hypercapnia: Secondary | ICD-10-CM

## 2012-08-19 DIAGNOSIS — R609 Edema, unspecified: Secondary | ICD-10-CM

## 2012-08-19 DIAGNOSIS — D649 Anemia, unspecified: Secondary | ICD-10-CM | POA: Diagnosis not present

## 2012-08-19 DIAGNOSIS — K7689 Other specified diseases of liver: Secondary | ICD-10-CM | POA: Diagnosis not present

## 2012-08-19 LAB — GLUCOSE, CAPILLARY: Glucose-Capillary: 107 mg/dL — ABNORMAL HIGH (ref 70–99)

## 2012-08-19 NOTE — Progress Notes (Signed)
Patient ID: Ann [name], female   DOB: June 29, 1945, 67 y.o.   MRN: [id number] Chief complaint; transferred to our service at [facility] from Dr. Juanetta Gosling.  The patient placed in CHF bed   This is a 67 year old woman with a multitude of medical issues who is admitted to the hospital under the care of Dr. Juanetta Gosling for 07/30/2012 through 08/12/2012.  At that point she was in septic shock I believe secondary to cellulitis of her left leg.  She developed anasarca and acute on chronic renal failure.  She was found to have a UTI.  She was treated with intravenous antibiotics initially in the ICU On the floor.  She developed problems with oral candidiasis and painful swallowing and it was assumed that she had candidal esophagitis.  He was not felt that she would be able to go home directly and therefore she was sent to this SNF.  She's been maintained on Lasix 40 IV twice a day.  She's also been being maintained on Lovenox 120 mg twice a day.  Although the patient states that she has had a history of a DVT I don't actually see this on her problem list that.  She does have a history of atrial fibrillation.  Nurses reports her her edema is much better although her weight has not really changed all that much at 308 on admission, and 303.6 today.   Medical history includes; COPD hypothyroidism, hyperlipidemia, hypertension, coronary artery disease status post stent, gastroesophageal reflux disease now with what is felt to be candidal esophagitis, cellulitis of the way up, type 2 diabetes on insulin, chronic diastolic heart failure with a echocardiogram in 2011 showing an EF of 65-70% right ventricular function normal, adrenal insufficiency on prednisone, acute on chronic renal failure, chronic anticoagulation, morbid obesity with questionable E. obesity hypoventilation syndrome, stage III chronic renal failure, atrial fibrillation, UTI, acute on chronic diastolic heart failure with anasarca, atypical chest pain,  diverticulitis of the colon,  Medications Xopenex 0.63 mg nebulizers Q6 Lasix 40 IV twice a day Zoloft 50 mg by mouth every morning, Prilosec 40 mg by mouth twice a day, gabapentin 100 mg by mouth 3 times a day, MiraLax 17 g by mouth daily, prednisone 5 mg daily, KCl 20 mEq twice a day, vitamin B12 thousand milligrams daily, doxycycline started 5 days ago, 10 units subcutaneous daily each bedtime along with sliding scale, Synthroid 200 mcg daily, Flagyl 500 every 8, vitamin D0 0.25 mcg by mouth daily, ciprofloxacin as completed, diltiazem CD 120 daily, Lovenox 120 mg twice a day, Toviaz 80 mg by mouth daily, Diflucan 100 mg by mouth daily x10 days, Flonase each nostril daily  Recent lab CT scan of the abdomen showed sigmoid diverticulitis and hepatic steatosis but no ascites. Arterial blood gases on 3.5 L of O2 pH 7.391 PCO2 57.2 PO2 70 carbonate 34 Labs at discharge sodium of 1.41 potassium 3.3 chloride 97 total CO2 33 BUN 13 creatinine 1.4 08/14/2012 Sodium 138 Potassium 3.6 CO2 96 CO2 33 BUN of 13 Creatinine of 1.9.  Social the patient lives in [place] with her husband.  She tells me she was on chronic oxygen could only walk for short distances due to this.  Quit smoking in the 1990s.  I completely certain about her functional status.  Family history none according to the patient  Review of systems; Respiratory; Ortis of breath with exertion and wheezing no no cough Cardiac no chest pain or palpitations GI; diarrhea 3-4 times per day no abdominal  pain Musculoskeletal; no active joint pain Neurologic leg weakness  Physical exam Gen. morbidly obese woman somewhat cushingoid in appearance.  Pulse 76 atrial fibrillation, pulse ox is 96 blood pressure 107/63 HEENT no oral thrush is seen Respiratory; surprisingly her air entry is quite good but she does have a prolonged expiratory phase with expiratory wheezing.  No accessory muscle use is noted.  Worker breathing is not labored cardiac heart  sounds are distant atrial fibrillation, JVD is not elevated, anasarca is noted with edema X. extending up into her flank. GI; massively obese with erythema and likely lymphedema in both flanks.  No tenderness.  No stigmata of chronic liver disease.  Rectal examination revealed no masses OB -ve Extremities; left greater than right erythema which is probably chronic venous stasis as there is no tenderness.  Cannot feel her peripheral pulses.  Neurologic; 4/5 proximal lower extremity strength worse on the left.  Reflexes are absent.  I did not try to ambulate her. Mental status; somewhat depressed but otherwise her cognition seems to be intact.   Impression/plan 1 anasarca; this is obviously multifactorial with components of diastolic heart failure, renal failure, morbid obesity with probable obesity hypoventilation syndrome.  She is not diuresis.  I am going to change her to oral Demadex from the IV Lasix although IV Lasix may be necessary.  I will check a serum albumin, protein to creatinine ratio in her urine.  #2 COPD continue the oxygen and Xopenex nebulizer. #3 diarrhea; put her MiraLax to when necessary #4 chronic venous stasis; calcium release some of her erythema I don't think she has cellulitis currently #5 diverticulitis on recent CT scan this does not appear to be unstable  #6 candidal esophagitis on Diflucan which will extend for a two-week course. #7 atrial fibrillation rate is controlled on chronic Lovenox.  Will change to an oral anticoagulant not Coumadin due to the Diflucan #8 chronic renal failure; this will need to be watched line #9 chronic respiratory failure likely secondary to a combination of COPD and obesity hypoventilation syndrome #10 renal insufficiency on chronic prednisone.  Cushingoid appearance presumably from prior prednisone use  The patient is apparently up walking with therapy.  She is limited in her exercise tolerance to perhaps 5-10 yard.  Cancidas getting much  better than this.

## 2012-08-20 LAB — GLUCOSE, CAPILLARY
Glucose-Capillary: 111 mg/dL — ABNORMAL HIGH (ref 70–99)
Glucose-Capillary: 257 mg/dL — ABNORMAL HIGH (ref 70–99)
Glucose-Capillary: 238 mg/dL — ABNORMAL HIGH (ref 70–99)

## 2012-08-21 ENCOUNTER — Non-Acute Institutional Stay (SKILLED_NURSING_FACILITY): Payer: Medicare Other | Admitting: Internal Medicine

## 2012-08-21 DIAGNOSIS — Z7901 Long term (current) use of anticoagulants: Secondary | ICD-10-CM | POA: Diagnosis not present

## 2012-08-21 DIAGNOSIS — N189 Chronic kidney disease, unspecified: Secondary | ICD-10-CM | POA: Diagnosis not present

## 2012-08-21 DIAGNOSIS — E876 Hypokalemia: Secondary | ICD-10-CM | POA: Diagnosis not present

## 2012-08-21 DIAGNOSIS — L02419 Cutaneous abscess of limb, unspecified: Secondary | ICD-10-CM | POA: Diagnosis not present

## 2012-08-21 DIAGNOSIS — L03116 Cellulitis of left lower limb: Secondary | ICD-10-CM

## 2012-08-21 DIAGNOSIS — K625 Hemorrhage of anus and rectum: Secondary | ICD-10-CM | POA: Diagnosis not present

## 2012-08-21 DIAGNOSIS — L03119 Cellulitis of unspecified part of limb: Secondary | ICD-10-CM

## 2012-08-21 DIAGNOSIS — I5032 Chronic diastolic (congestive) heart failure: Secondary | ICD-10-CM | POA: Diagnosis not present

## 2012-08-21 LAB — GLUCOSE, CAPILLARY: Glucose-Capillary: 125 mg/dL — ABNORMAL HIGH (ref 70–99)

## 2012-08-22 ENCOUNTER — Encounter: Payer: Self-pay | Admitting: Internal Medicine

## 2012-08-22 ENCOUNTER — Non-Acute Institutional Stay (SKILLED_NURSING_FACILITY): Payer: Medicare Other | Admitting: Internal Medicine

## 2012-08-22 DIAGNOSIS — D649 Anemia, unspecified: Secondary | ICD-10-CM

## 2012-08-22 DIAGNOSIS — L02419 Cutaneous abscess of limb, unspecified: Secondary | ICD-10-CM | POA: Diagnosis not present

## 2012-08-22 DIAGNOSIS — L03116 Cellulitis of left lower limb: Secondary | ICD-10-CM

## 2012-08-22 DIAGNOSIS — Z7901 Long term (current) use of anticoagulants: Secondary | ICD-10-CM

## 2012-08-22 DIAGNOSIS — N189 Chronic kidney disease, unspecified: Secondary | ICD-10-CM | POA: Diagnosis not present

## 2012-08-22 DIAGNOSIS — K625 Hemorrhage of anus and rectum: Secondary | ICD-10-CM | POA: Diagnosis not present

## 2012-08-22 DIAGNOSIS — I5032 Chronic diastolic (congestive) heart failure: Secondary | ICD-10-CM | POA: Diagnosis not present

## 2012-08-22 DIAGNOSIS — L03119 Cellulitis of unspecified part of limb: Secondary | ICD-10-CM

## 2012-08-22 DIAGNOSIS — E876 Hypokalemia: Secondary | ICD-10-CM | POA: Diagnosis not present

## 2012-08-22 LAB — GLUCOSE, CAPILLARY
Glucose-Capillary: 163 mg/dL — ABNORMAL HIGH (ref 70–99)
Glucose-Capillary: 156 mg/dL — ABNORMAL HIGH (ref 70–99)

## 2012-08-22 NOTE — Progress Notes (Signed)
Patient ID: Ann [name], female   DOB: 1945/10/16, 68 y.o.   MRN: [id number] Chief complaint-acute visit for followup left leg cellulitis-anemia.  History of present illness.  Patient is a pleasant elderly resident with a complex medical history.  I did see her yesterday for increased erythema of her left leg-apparently she does have some history of cellulitis there as well as venous stasis.  She was started on Rocephin for 7 day course-she is also completing course of doxycycline for a loop left arm wound at appears to have largely healed.  The erythema actually appears somewhat reduced today-she continues to be afebrile.  She also has a history of some rectal bleeding with guaiac positive stools-has had a history of diverticulitis.  We ordered lab work that has come back showing a hemoglobin of 7.6 this is down from 8.6 on discharge from hospital earlier this month.  She does continue on iron she is also on a proton pump inhibitor twice a day.  She does complain of some generalized weakness but denies any palpitations chest pain or increased shortness of breath.  She also has a history of chronic renal insufficiency creatinine on updated lab is 1.77 with a BUN of 27-this is a slight improvement over lab done on March 21 where her creatinine was 1.92---  Today other than some generalized weakness----- and pain that she says is relieved with meds- she has no acute complaints.  Family medical social history reviewed per discharge summary on 08/12/2012.  Medications have been reviewed. Per MAR.  Review of systems.  General she denies fever chills.  Skin-some possible left leg cellulitis which appears to be improving today  Respiratory-does not complaining of any increased shortness of breath from baseline S.  Cardiac-denies chest pain palpitations.  GI-no complaints of nausea vomiting or abdominal pain.  Muscle skeletal-has some diffuse generalized pain she says is relieved  with medication  Neurologic-no complaints of headache dizziness or syncopal type episodes.  Physical exam.  Temperature is 97.1 pulse 92 respirations 21 blood pressure 104/73 weight is 303 which appears to be relatively stable    O 2 saturation is 91%.  In general this is a morbidly obese elderly female in no distress lying comfortably in bed.  Skin is warm and dry.  The erythema on her lower left leg appears to be somewhat reduced compared to yesterday also somewhat reduced erythema on her right thigh area.  Chest is clear to auscultation with somewhat shallow air entry no labored breathing.  Heart regular rate and rhythm without murmur gallop rue continues with 3+ lower extremity edema bilaterally  Abdomen-is obese soft nontender with positive bowel sounds that she does have some baseline bruising from previous Lovenox injections.  Muscle skeletal-Gen. weakness of her lower extremities-erythema again appears somewhat improved on her legs.  Neurologic-grossly intact speech is clear.  Labs.  [place] 9 2014.  WBC 10.5 hemoglobin 7.6 platelets 247.  Sodium 136 potassium 5.2 BUN 27 creatinine 1.77.  Assessment and plan.  Number -one-- anemia-thought to be iron deficient she is on iron-she does have some history of rectal bleeding here as well-clinically she appears stable-I do know she is on Xeralto  as well as aspirin for history of DVT as well as atrial fibrillation-this was discussed with Dr.Dasanayaka  Via phone --at this point will continue anticoagulation and update CBC tomorrow-continue iron and proton pump inhibitor.  #2-left leg cellulitis-this appears to be improved cannot rule out some venous stasis contributing to this certainly-we'll continue antibiotics  for now-she is also finishing a course of doxycycline therapy on which appears to be largely resolved.  #3-renal insufficiency-she does have a history of chronic renal insufficiency-in hospital creatinine ran in the  mid ones-Will update metabolic panel in one week.   . [id number] note 30 minutes spent assessing patient and formulating a plan of care for several diagnoses including  challenging diagnosis of anemia with history of rectal bleeding complicated with anticoagulation secondary to DVT and afib       Family medical

## 2012-08-22 NOTE — Progress Notes (Signed)
Patient ID: Ann [name], female   DOB: 01/24/1946, 67 y.o.   MRN: [id number] . Chief complaint-acute visit to assess left leg erythema.  History of present illness.  Patient is a pleasant 67 year old female who has a somewhat complicated medical history  Recently hospitalized for sepsis I believe this was thought to be more of a urinary tract sourc apparently she did have some left leg cellulitis as well-this all appears to have responded well to antibiotics.  She also had a history of diverticulitis and apparently had some rectal bleeding and this was treated as well.  Apparently nursing staff earlier this week did note some blood tinge spotting in her diaper-an occult stools were ordered which have come back positive x2  She currently is on aspirin andXeralto for history of atrial fibrillation as well as DVT that apparently occurred in November of last year.  Nursing staff also has noted some erythema of her left leg-somewhat difficult to tell how big of a change this is.  Patient states she has some chronic erythema here-- venous stasis  Is also on doxycycline for a right upper arm wound that has largely resolved.  She is currently afebrile and has no acute complaints-she does complain of some generalized pain apparently pain medication is effective.  Family medical social history reviewed per discharge summary on 08/12/2012.  Medications have been reviewed.  Review of systems per  General denies fever or chills.  Eyes denies visual changes.  Nose and throat-not complaining of any sore throat or nasal discharge.  Respiratory-says her breathing is about baseline denies any acute shortness of breath or change from baseline.  Cardiac-denies any chest pain does have some fairly massive edema which is baseline.  GI- denies any nausea or vomiting or abdominal pain.  Muscle skeletal-this complain at times of leg pain this is controlled with current medications.  has some  erythema of her left leg greater than right  Neurologic-does not complain of headache or dizziness.  Psych --t she does appears mildly anxious  Physical exam.  Temperatures 97.4 pulse 85 respirations 22 blood pressure 112/70.  In general this is a pleasant as obese elderly female in no distress lying comfortably in bed.  She does have somewhat of a cushingoid appearance.  Eyes pupils equal round reactive to light sclera and conjunctiva clear.  Oropharynx clear mucous membranes moist.  Chest she has somewhat reduced breath sounds at suspect secondary to obesity but clear to auscultation with no labored breathing.  Heart is regular rate and rhythm without murmur gallop or rub.  Abdomen is soft obese nontender positive bowel sounds.  Muscle skeletal-does have significant lower extremity edema--i 3+ which appears to be at baseline there is some erythema extending from her ankle two thirds way up her lower leg this is on her left-there is a small amount of erythema on the other leg as well.  Since slightly warm to touch not acutely tender.  Neurologic - is grossly intact--cranial nerves intact S. speech is clear.  Psych she is alert and oriented x3 pleasant and appropriate.  Labs.  08/14/2012.  Sodium 138 potassium 3.6 BUN 13 creatinine 1.92.  08/12/2012.  Discharge hemoglobin was 8.6-WBC 10.5   .  Assessment and plan.  #1-left leg cellulitis?--With her history will treat with Rocephin for now 1 g IM daily for 7 days and monitor also will add a probiotic twice a day--of note she does continue on doxycycline as well as finishing a course of that for right arm  wound that is resolving the   monitor for any changes here.  #2-rectal bleeding?-Discussed with Dr. Leanord Hawking via phone we will order a CBC for tomorrow to make sure her hemoglobin is stable-she does continue on a proton pump inhibitor twice a day-she is also on iron-Will await lab work results.  Will continue  anticoagulation with history of recent DVT as well as atrial fibrillation   [id number]

## 2012-08-23 LAB — GLUCOSE, CAPILLARY: Glucose-Capillary: 111 mg/dL — ABNORMAL HIGH (ref 70–99)

## 2012-08-24 ENCOUNTER — Non-Acute Institutional Stay (SKILLED_NURSING_FACILITY): Payer: Medicare Other | Admitting: Internal Medicine

## 2012-08-24 DIAGNOSIS — J441 Chronic obstructive pulmonary disease with (acute) exacerbation: Secondary | ICD-10-CM | POA: Diagnosis not present

## 2012-08-24 DIAGNOSIS — N189 Chronic kidney disease, unspecified: Secondary | ICD-10-CM

## 2012-08-24 DIAGNOSIS — J9610 Chronic respiratory failure, unspecified whether with hypoxia or hypercapnia: Secondary | ICD-10-CM | POA: Diagnosis not present

## 2012-08-24 DIAGNOSIS — R601 Generalized edema: Secondary | ICD-10-CM

## 2012-08-24 DIAGNOSIS — K7689 Other specified diseases of liver: Secondary | ICD-10-CM | POA: Diagnosis not present

## 2012-08-24 DIAGNOSIS — R609 Edema, unspecified: Secondary | ICD-10-CM | POA: Diagnosis not present

## 2012-08-24 DIAGNOSIS — D649 Anemia, unspecified: Secondary | ICD-10-CM

## 2012-08-24 LAB — GLUCOSE, CAPILLARY
Glucose-Capillary: 103 mg/dL — ABNORMAL HIGH (ref 70–99)
Glucose-Capillary: 184 mg/dL — ABNORMAL HIGH (ref 70–99)
Glucose-Capillary: 189 mg/dL — ABNORMAL HIGH (ref 70–99)
Glucose-Capillary: 188 mg/dL — ABNORMAL HIGH (ref 70–99)

## 2012-08-24 NOTE — Progress Notes (Signed)
Patient ID: Ann [name], female   DOB: 06/04/1945, 67 y.o.   MRN: [id number] Chief complaint; review of diastolically mediated heart failure, chronic renal failure, anemia  History; the patient accepted in transfer from Dr. Juanetta Gosling last week.  She has anasarca which is a multifactorial, I changed her from IV Lasix 40 twice a day to Demadex 80 mg orally twice a day.  She was also on Lovenox and I changed her to oral xarelto.  Lab work has come back showing a BUN of 27 and a creatinine of 1.77, potassium 5.2.  Her hemoglobin is 7.6.  I note that her hemoglobin was between 8 and 9 in the hospital.  In the facility it seems to vary between 7 and 8.  We have 2 stools which are occult blood negative.  I don't have much more information on this problem at all.  Her creatinine seems to be stable representing some degree of chronic renal insufficiency based on what I can see from her initial hospitalization.  Review of systems; Gen. her weight is 300 pounds this is reasonably stable on the oral Demadex vs. the IV Lasix although she is not really losing weight. Respiratory; oxygen on no complaints of cough or shortness of Cardiac; no chest pain or palpitations GI; constipation, no abdominal pain, no dysphagia Musculoskeletal able to walk perhaps 10 feet, stops this due to her knees collapsing.  Examination; Gen. morbidly obese, no distress temperature 97.8 pulse 87 respirations 18 blood pressure 103/61 O2 sat 94% on 2 L weight 300.3 pounds [305.4 on admission] Respiratory; air entry is actually fairly good.  Mild expiratory wheezing Cardiac; heart sounds are distant probably atrial fib no S3 JVD is not elevated although very difficult to tell due to soft tissue GI; no masses no tenderness.  Small knot about the size of a golf ball in her right lower quadrant soft tissue which I think is secondary to Lovenox injection. Extremities; significant venous stasis with erythema but no  cellulitis.  Impressions/plan #1 anasarca; multifactorial.  I am going to increase her Demadex to 100 twice a day.  She had 2000 cc of urine output yesterday I may need to fluid restrict her #2 chronic renal insufficiency this is stable.  Reduce potassium from 20 mEq twice a day daily starting tomorrow. #3 anemia; don't have a clear sense of this hemoglobin yesterday was 7.6.  This is normochromic normocytic.  She had recent episode of diverticulitis however stool 2 stools are guaiac negative #4 atrial fibrillation; rate is controlled on xarelto.  #5 COPD on chronic oxygen.  Severe but reasonably stable.  Continue Xopenex nebulizer.   We'll review her in 2 days.

## 2012-08-25 LAB — GLUCOSE, CAPILLARY: Glucose-Capillary: 220 mg/dL — ABNORMAL HIGH (ref 70–99)

## 2012-08-26 ENCOUNTER — Non-Acute Institutional Stay (SKILLED_NURSING_FACILITY): Payer: Medicare Other | Admitting: Internal Medicine

## 2012-08-26 DIAGNOSIS — R7989 Other specified abnormal findings of blood chemistry: Secondary | ICD-10-CM | POA: Diagnosis not present

## 2012-08-26 DIAGNOSIS — R601 Generalized edema: Secondary | ICD-10-CM

## 2012-08-26 DIAGNOSIS — R609 Edema, unspecified: Secondary | ICD-10-CM | POA: Diagnosis not present

## 2012-08-26 LAB — GLUCOSE, CAPILLARY

## 2012-08-27 LAB — GLUCOSE, CAPILLARY
Glucose-Capillary: 214 mg/dL — ABNORMAL HIGH (ref 70–99)
Glucose-Capillary: 161 mg/dL — ABNORMAL HIGH (ref 70–99)

## 2012-08-28 ENCOUNTER — Non-Acute Institutional Stay (SKILLED_NURSING_FACILITY): Payer: Medicare Other | Admitting: Internal Medicine

## 2012-08-28 ENCOUNTER — Ambulatory Visit (HOSPITAL_COMMUNITY)
Admit: 2012-08-28 | Discharge: 2012-08-28 | Disposition: A | Discharge status: Skilled Nursing Facility | Payer: Medicare Other | Source: Skilled Nursing Facility | Attending: Internal Medicine | Admitting: Internal Medicine

## 2012-08-28 DIAGNOSIS — L02419 Cutaneous abscess of limb, unspecified: Secondary | ICD-10-CM | POA: Diagnosis not present

## 2012-08-28 DIAGNOSIS — N189 Chronic kidney disease, unspecified: Secondary | ICD-10-CM | POA: Diagnosis not present

## 2012-08-28 DIAGNOSIS — Z7901 Long term (current) use of anticoagulants: Secondary | ICD-10-CM | POA: Diagnosis not present

## 2012-08-28 DIAGNOSIS — I5032 Chronic diastolic (congestive) heart failure: Secondary | ICD-10-CM | POA: Diagnosis not present

## 2012-08-28 DIAGNOSIS — L03116 Cellulitis of left lower limb: Secondary | ICD-10-CM

## 2012-08-28 DIAGNOSIS — D649 Anemia, unspecified: Secondary | ICD-10-CM

## 2012-08-28 DIAGNOSIS — E876 Hypokalemia: Secondary | ICD-10-CM

## 2012-08-28 DIAGNOSIS — N183 Chronic kidney disease, stage 3 unspecified: Secondary | ICD-10-CM

## 2012-08-28 DIAGNOSIS — R748 Abnormal levels of other serum enzymes: Secondary | ICD-10-CM | POA: Insufficient documentation

## 2012-08-28 DIAGNOSIS — K7689 Other specified diseases of liver: Secondary | ICD-10-CM | POA: Insufficient documentation

## 2012-08-28 DIAGNOSIS — K625 Hemorrhage of anus and rectum: Secondary | ICD-10-CM | POA: Diagnosis not present

## 2012-08-28 LAB — GLUCOSE, CAPILLARY
Glucose-Capillary: 120 mg/dL — ABNORMAL HIGH (ref 70–99)
Glucose-Capillary: 232 mg/dL — ABNORMAL HIGH (ref 70–99)

## 2012-08-28 NOTE — Progress Notes (Signed)
Patient ID: RAYONNA HELDMAN, female   DOB: 1945-12-01, 67 y.o.   MRN: [id number] Patient ID: MALLI FALOTICO, female   DOB: [birth year], 67 y.o.   MRN: [id number] Chief complaint-acute visit secondary to hypokalemia.  History of present illness.  Patient is a pleasant elderly resident with a complex medical history.  I did see her last week for increased erythema of her left leg-apparently she does have some history of cellulitis there as well as venous stasis.  She was started on Rocephin for 7 day course-she is completing this in her leg does appear to be somewhat improved although I suspect there is venous stasis contributing to this as well.  she continues to be afebrile.  She also has a history of some rectal bleeding with guaiac positive stools-has had a history of diverticulitis.--She has been started on iron-DrRrobson has dc'd Xeralto    she is also on a proton pump inhibitor twice a day.  She does complain of some generalized weakness but denies any palpitations chest pain or increased shortness of breath.  She also has a history of chronic renal insufficiency creatinine on updated lab is 182 with a BUN of 38-this is a slight improvement over lab done on March 21 where her creatinine was 1.92---  Today other than some generalized weakness----- and pain that she says is relieved with meds- she has no acute complaints  She does have significant edema-her Demadex was recently increased to 100 mg twice a day-and update metabolic panel does show potassium of 3.0.  She is on 20 mEq of potassium-potassium was reduced last week secondary to a potassium of 5.0 on metabolic panel-.  She denies any chest pain or palpitations today.  Family medical social history reviewed per discharge summary on 08/12/2012.  Medications have been reviewed. Per MAR.  Review of systems.  General she denies fever chills.  Skin-is completing a course for questionable left leg  cellulitis  Respiratory-does not complain of any increased shortness of breath from baseline S.  Cardiac-denies chest pain palpitations.  GI-no complaints of nausea vomiting or abdominal pain.  Muscle skeletal-has some diffuse generalized pain she says is relieved with medication  Neurologic-no complaints of headache dizziness or syncopal type episodes.  Physical exam.   Temperature 97.6 pulse 86 respirations 22 blood pressure 113/64-O2 saturation is 95% on oxygen to weight is 285.2 and this actually appears to be stable with yesterday's weight and down about 5-7 pounds from earlier this week.  In general this is a morbidly obese elderly female in no distress lying comfortably in bed.  Skin is warm and dry.  The erythema on her lower left leg appears to bet reduced compared to previous exam . I suspect what remains is more venous insufficiency but will have to keep an eye on it  Chest is clear to auscultation with somewhat shallow air entry no labored breathing.  Heart regular rate and rhythm with occasional irregular beats   without murmur gallop or rub  continues with  2- 3+ lower extremity edema bilaterally  Abdomen-is obese soft nontender with positive bowel sounds that she does have some baseline bruising from previous Lovenox injections.  Muscle skeletal-Gen. weakness of her lower extremities-erythema again appears somewhat improved on her legs.  Neurologic-grossly intact speech is clear.  Labs. 08/28/2012.  Sodium 138 potassium 3 BUN 38 creatinine 1.82  08/27/2002  WBC 10.2 hemoglobin 7.6   [id number]   Sodium 140 potassium 3.8 BUN 28 creatinine 1.76  . Marland Kitchen  Assessment  and plan  Number one---- hypokalemia-Will supplement fairly aggressively with potassium 40 mEq twice a day today then 40 mEq a day thereafter we'll check a metabolic panel tomorrow potassium went down fairly rapidly  in 2 days would be suspicious about lab accuracy would like to get this checked  again tomorrow .  Number -2-- anemia-thought to be iron deficient she is on iron-she does have some history of rectal bleeding here as well-clinically she appears stable- [facility] Ramp has been dc'd--on   aspirin for history of DVT as well as atrial fibrillation--continue iron and proton pump inhibitor.  #3-left leg cellulitis-this appears to be improved cannot rule out some venous stasis contributing to this certainly-we'll continue antibiotics for now-she is also finishing a course of doxycycline therapy on which appears to be largely resolved.  #4-renal insufficiency-she does have a history of chronic renal insufficiency-this appears at baseline   [id number].         Family medical

## 2012-08-30 LAB — GLUCOSE, CAPILLARY
Glucose-Capillary: 151 mg/dL — ABNORMAL HIGH (ref 70–99)
Glucose-Capillary: 94 mg/dL (ref 70–99)
Glucose-Capillary: 195 mg/dL — ABNORMAL HIGH (ref 70–99)
Glucose-Capillary: 135 mg/dL — ABNORMAL HIGH (ref 70–99)
Glucose-Capillary: 112 mg/dL — ABNORMAL HIGH (ref 70–99)
Glucose-Capillary: 135 mg/dL — ABNORMAL HIGH (ref 70–99)

## 2012-08-31 ENCOUNTER — Ambulatory Visit: Payer: Medicare Other | Admitting: Gastroenterology

## 2012-08-31 ENCOUNTER — Non-Acute Institutional Stay (SKILLED_NURSING_FACILITY): Payer: Medicare Other | Admitting: Internal Medicine

## 2012-08-31 DIAGNOSIS — N189 Chronic kidney disease, unspecified: Secondary | ICD-10-CM | POA: Diagnosis not present

## 2012-08-31 DIAGNOSIS — R609 Edema, unspecified: Secondary | ICD-10-CM | POA: Diagnosis not present

## 2012-08-31 DIAGNOSIS — J441 Chronic obstructive pulmonary disease with (acute) exacerbation: Secondary | ICD-10-CM | POA: Diagnosis not present

## 2012-08-31 DIAGNOSIS — K7689 Other specified diseases of liver: Secondary | ICD-10-CM | POA: Diagnosis not present

## 2012-08-31 DIAGNOSIS — D649 Anemia, unspecified: Secondary | ICD-10-CM | POA: Diagnosis not present

## 2012-08-31 DIAGNOSIS — J9610 Chronic respiratory failure, unspecified whether with hypoxia or hypercapnia: Secondary | ICD-10-CM | POA: Diagnosis not present

## 2012-08-31 DIAGNOSIS — N183 Chronic kidney disease, stage 3 unspecified: Secondary | ICD-10-CM

## 2012-08-31 DIAGNOSIS — R601 Generalized edema: Secondary | ICD-10-CM

## 2012-08-31 DIAGNOSIS — K831 Obstruction of bile duct: Secondary | ICD-10-CM

## 2012-08-31 LAB — GLUCOSE, CAPILLARY: Glucose-Capillary: 119 mg/dL — ABNORMAL HIGH (ref 70–99)

## 2012-08-31 NOTE — Progress Notes (Signed)
Patient ID: Ann [name], female   DOB: 1945-06-27, 67 y.o.   MRN: [id number] Chief complaint; review of multiple medical issues including anasarca, elevated liver function tests, anemia with GI bleeding  History; Ann [name] is a patient that that we took over 2 weeks ago from Dr. Juanetta [name] due to severe anasarca. With regards to this issue she has done quite well. Her admission weight was 305.4 pounds she is now down to 282.1. In fact she has lost 18 pounds since 08/24/2012. This is probably excess and I will reduce her Demadex somewhat. Not surprising we have had some difficulties with hypokalemia with a potassium of  3.2 on April 7.   Other issues we have had include a hemoglobin of 7.8 with OB positive stools. She was on Lovenox at full doses and was switched to xarelto. However I discontinued this due to decline in her hemoglobin. Today her hemoglobin is 7.8. Her MCV is 102, white count and platelet count are normal. Finally she is having elevated liver function tests with an alkaline phosphatase in the 800 range AST of 156 ALT of 49. She had a recent CT scan of her abdomen that was negative. Ultrasound of her abdomen showed a fatty liver, pancreas was normal common bile duct measured 7 mm, gallbladder is surgically absent.   Review of systems; Respiratory-oxygen on no excessive shortness of breath or wheeze Cardiac-no chest pain GI-she complains of some emesis but no hematemesis. No diarrhea. She has complained of lower abdominal pain fairly consistent but no epigastric right upper quadrant pain.  GU no  Physical examination temperature 90.7, pulse 91, respirations 19 blood pressure 105/69 O2 sat is 96% Gen. she is not in any distress work of breathing is normal Respiratory-shallow but otherwise clear entry no wheezing Cardiac-heart sounds are irregular JVD is not elevated. There is a considerable loss of swelling in her legs although there is still more ago here with pitting edema up into  her groin area Abdomen-bruising and striations are noted no liver no spleen and no stigmata Extremity- erythema related to venous stasis I don't believe there is cellulitis.  Impression/plan #1 cholestatic liver function tests. I am going to consult GI on this. I suspect this is due to nonalcoholic steatohepatitis. However these were recently normal I believe during her index hospitalization. There's no overt obstruction on recent CT or ultrasound #2 anasarca multifactorial including diastolic heart failure, chronic renal insufficiency [not nephrotic], hypoalbuminemia. We have been somewhat excessive and the diuresis I'm going to drop her to Demadex to 80 twice a day. We'll need to replace her potassium and check her #3 COPD this is severe but generally stable #4 probable severe venous stasis with erythema secondary to this 5-anemia with OB+ve stools. Will aske for GI review on this as well  I have asked for a GI consultation. Reduced her Demadex replaced her potassium.

## 2012-09-01 LAB — GLUCOSE, CAPILLARY
Glucose-Capillary: 106 mg/dL — ABNORMAL HIGH (ref 70–99)
Glucose-Capillary: 174 mg/dL — ABNORMAL HIGH (ref 70–99)

## 2012-09-02 LAB — GLUCOSE, CAPILLARY
Glucose-Capillary: 112 mg/dL — ABNORMAL HIGH (ref 70–99)
Glucose-Capillary: 117 mg/dL — ABNORMAL HIGH (ref 70–99)

## 2012-09-03 LAB — GLUCOSE, CAPILLARY: Glucose-Capillary: 172 mg/dL — ABNORMAL HIGH (ref 70–99)

## 2012-09-03 NOTE — Progress Notes (Signed)
Patient ID: Ann [name], female   DOB: [birth year], 67 y.o.   MRN: [id number]          PROGRESS NOTE  DATE:   08/26/2012     FACILITY:  [facility]   LEVEL OF CARE:   SNF   Acute Visit   CHIEF COMPLAINT:  Follow up a multitude of medical issues.    HISTORY OF PRESENT ILLNESS:  Ann [name] is a complex patient whom I took over last week from Dr. Juanetta Gosling in the facility.  She has chronic diastolic heart failure, probably obesity hypoventilation syndrome, and anasarca, as well as chronic renal insufficiency.  She was receiving IV lasix.  I switched her to oral Demadex and increased the dose two days ago to 100 b.i.d.  BMP from today shows a sodium of 138, a potassium of 3.8, chloride of 107 and a CO2 of 31, BUN of 28 and a creatinine of 1.76.  Her white count is 10.2.  His hemoglobin is 7.6.  We have done stool guaiacs which are positive.  Stool for C. diff is negative.    Quite unexpectedly, we did a comprehensive metabolic panel on her on 08/24/2012 which showed an alk phos of 780, AST of 162 and an ALT of 48.  I do not see a previous value.   CT scan of the abdomen was done on 08/08/2012 that showed slight hepatic steatosis, status post cholecystectomy.  There were no dilated bile ducts.  Everything else appeared to be normal.  The patient is not complaining of upper abdominal pain, but is describing lower abdominal pain.     REVIEW OF SYSTEMS:   CHEST/RESPIRATORY:  The patient is slightly more short of breath.   CARDIAC:   No clear chest pain.   GI:  He complains of lower abdominal pain.  MUSCULOSKELETAL:  Can only walk for short distances as her legs give way and she is too short of breath.    PHYSICAL EXAMINATION:   VITAL SIGNS:   TEMPERATURE:  98.5.  PULSE:  74.   RESPIRATIONS:  20.   BLOOD PRESSURE:  120/70.   O2 SATURATIONS:  97%. WEIGHT:  Her weights have gone from 300 pounds two days ago down to 292.  Therefore, I will continue this dose of Demadex.    CHEST/RESPIRATORY:  Air entry is fairly good.  Mild expiratory wheeze.   CARDIOVASCULAR:  CARDIAC:   Heart sounds are distant.  JVP is not clearly elevated.  However, the anatomy here makes this difficult.   GASTROINTESTINAL:  ABDOMEN:  She has lower abdominal bruising and some tender areas which I suspect is Lovenox injections.  There are no open areas here.  She has striae.   LIVER/SPLEEN/KIDNEYS:  No clear liver edge was palpable.  No clear tenderness.  There was no liver.   CIRCULATION:   EDEMA/VARICOSITIES:  Significant edema, chronic erythema, likely secondary to venous stasis.  I do not see this as being cellulitis.     ASSESSMENT/PLAN:  Elevated liver function tests in a cholestatic pattern.  I do not see a previous value.  This may be a hepatic steatosis.  She is on atorvastatin although I have never seen this pattern from statins.    Anasarca.  Multifactorial with diastolic heart failure, obesity hypoventilation syndrome, probable chronic right ventricular failure.   She has lost eight pounds with the increase of the Demadex to 100 b.i.d.  I am going to continue this dose until next week.  Chronic renal insufficiency.  BUN of 28, creatinine of 1.76.  This seems not to be unstable.    OB-positive stool.  She is anemic with a hemoglobin of 7.6. I will have to see how she looks down the road.   It is not out of the question that she would be able to get a workup.  Transfusions may also be necessary.    She was on Lovenox.  I switched her to Xarelto.  I am going to discontinue the Xarelto as the risk/benefit ratio in terms of her atrial fibrillation just is not there.     CPT CODE: 16109.

## 2012-09-04 LAB — GLUCOSE, CAPILLARY
Glucose-Capillary: 88 mg/dL (ref 70–99)
Glucose-Capillary: 173 mg/dL — ABNORMAL HIGH (ref 70–99)

## 2012-09-05 LAB — GLUCOSE, CAPILLARY: Glucose-Capillary: 163 mg/dL — ABNORMAL HIGH (ref 70–99)

## 2012-09-06 LAB — GLUCOSE, CAPILLARY
Glucose-Capillary: 156 mg/dL — ABNORMAL HIGH (ref 70–99)
Glucose-Capillary: 181 mg/dL — ABNORMAL HIGH (ref 70–99)

## 2012-09-07 ENCOUNTER — Non-Acute Institutional Stay (SKILLED_NURSING_FACILITY): Payer: Medicare Other | Admitting: Internal Medicine

## 2012-09-07 DIAGNOSIS — K7689 Other specified diseases of liver: Secondary | ICD-10-CM | POA: Diagnosis not present

## 2012-09-07 DIAGNOSIS — R609 Edema, unspecified: Secondary | ICD-10-CM

## 2012-09-07 DIAGNOSIS — K831 Obstruction of bile duct: Secondary | ICD-10-CM

## 2012-09-07 DIAGNOSIS — D649 Anemia, unspecified: Secondary | ICD-10-CM | POA: Diagnosis not present

## 2012-09-07 DIAGNOSIS — N189 Chronic kidney disease, unspecified: Secondary | ICD-10-CM | POA: Diagnosis not present

## 2012-09-07 DIAGNOSIS — R601 Generalized edema: Secondary | ICD-10-CM

## 2012-09-07 DIAGNOSIS — J9610 Chronic respiratory failure, unspecified whether with hypoxia or hypercapnia: Secondary | ICD-10-CM | POA: Diagnosis not present

## 2012-09-07 DIAGNOSIS — N183 Chronic kidney disease, stage 3 unspecified: Secondary | ICD-10-CM

## 2012-09-07 DIAGNOSIS — J441 Chronic obstructive pulmonary disease with (acute) exacerbation: Secondary | ICD-10-CM | POA: Diagnosis not present

## 2012-09-07 LAB — GLUCOSE, CAPILLARY
Glucose-Capillary: 148 mg/dL — ABNORMAL HIGH (ref 70–99)
Glucose-Capillary: 100 mg/dL — ABNORMAL HIGH (ref 70–99)

## 2012-09-07 NOTE — Progress Notes (Signed)
Patient ID: Ann [name], female   DOB: 07/06/1945, 67 y.o.   MRN: [id number] Chief complaint; followup multiple medical issues including anasarca, elevated liver function tests, anemia.  History; I have been following Ann [name] who is a patient I took over 3 weeks ago from Dr. Juanetta Gosling this severe anasarca.  From this point of view she is on the reasonably well.  Her weight is down to 278 pounds from an admission weight of 305.4.  I think this is probably close to her dry weight. this.  Her BUN/creatinine are holding steady roughly 31 and 1.50 today.  She is on the Demadex and 80 twice a day.  Potassium is slightly low at 3.3.  The pathogenesis of her anasarca was felt to include diastolic heart failure, chronic renal failure, hypoalbuminemia.  She has COPD which is severe but generally stable.  A new issue has been that of elevated liver function tests in a cholestatic pattern.  I have repeated these and the same pattern arises with an Alk phos in the 600-700 range and AST ALT in 100's. She complains of nausea but no abdominal pain. She is s/p remote cholecystectomy. Imaging strudies including a Ct of the abdoman and ultrasound show no dilated ducts, steatosis. Her LFT's were normal in [facility] March.  She also has anemia with OB+ve stools, mild CRF.   Review of Systems:  Resp No SOB,no cough Cardiac. No exertional chest pain GI: nausea and anorexia. No diarrhea. No jaudice.  Physical Exam;  Resp: clear a/e. No wheezing CVS. No signs of CHF. HS n. No increased JVD . I believe is approaching her dry weight.  Abdomen: obese. Tender in the RUQ. Epigastic. No guarding.  Ext Chronic venous stasis. No cellulits. Mild edema.    Impression/plan:  1)multifactorial anasarca. Improved, will correct K+; otherwise better. 2) Cholestatic liver function pattern. Not overtly obstructive. No obvious medication causes. Will screen forHep A and B which I believe can occasionally present a cholestatic pattern  . 3) afib controlled had to stop anticoagulation due to anemia nd OB+ve 4)COPD severe but stable 5)CRF stable.

## 2012-09-08 LAB — GLUCOSE, CAPILLARY
Glucose-Capillary: 104 mg/dL — ABNORMAL HIGH (ref 70–99)
Glucose-Capillary: 169 mg/dL — ABNORMAL HIGH (ref 70–99)
Glucose-Capillary: 121 mg/dL — ABNORMAL HIGH (ref 70–99)

## 2012-09-09 ENCOUNTER — Ambulatory Visit (INDEPENDENT_AMBULATORY_CARE_PROVIDER_SITE_OTHER): Payer: Medicare Other | Admitting: Gastroenterology

## 2012-09-09 ENCOUNTER — Encounter: Payer: Self-pay | Admitting: Gastroenterology

## 2012-09-09 VITALS — BP 111/65 | HR 95 | Temp 98.4°F | Ht 64.0 in | Wt 275.0 lb

## 2012-09-09 DIAGNOSIS — R7989 Other specified abnormal findings of blood chemistry: Secondary | ICD-10-CM | POA: Diagnosis not present

## 2012-09-09 DIAGNOSIS — K219 Gastro-esophageal reflux disease without esophagitis: Secondary | ICD-10-CM

## 2012-09-09 DIAGNOSIS — D649 Anemia, unspecified: Secondary | ICD-10-CM

## 2012-09-09 DIAGNOSIS — R195 Other fecal abnormalities: Secondary | ICD-10-CM

## 2012-09-09 DIAGNOSIS — R945 Abnormal results of liver function studies: Secondary | ICD-10-CM | POA: Insufficient documentation

## 2012-09-09 DIAGNOSIS — R112 Nausea with vomiting, unspecified: Secondary | ICD-10-CM

## 2012-09-09 DIAGNOSIS — R1903 Right lower quadrant abdominal swelling, mass and lump: Secondary | ICD-10-CM

## 2012-09-09 LAB — GLUCOSE, CAPILLARY: Glucose-Capillary: 167 mg/dL — ABNORMAL HIGH (ref 70–99)
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
## 2012-09-09 NOTE — Patient Instructions (Addendum)
I will do extensive review of your records today. We plan to do CT scan of your abdomen to further evaluate abdominal mass and your liver.

## 2012-09-10 ENCOUNTER — Ambulatory Visit (HOSPITAL_COMMUNITY)
Admit: 2012-09-10 | Discharge: 2012-09-10 | Disposition: A | Discharge status: Home / Self Care | Payer: Medicare Other | Source: Ambulatory Visit | Attending: Gastroenterology | Admitting: Gastroenterology

## 2012-09-10 ENCOUNTER — Telehealth: Payer: Self-pay | Admitting: Gastroenterology

## 2012-09-10 ENCOUNTER — Encounter: Payer: Self-pay | Admitting: Gastroenterology

## 2012-09-10 ENCOUNTER — Other Ambulatory Visit: Payer: Self-pay | Admitting: Gastroenterology

## 2012-09-10 ENCOUNTER — Encounter (HOSPITAL_COMMUNITY): Payer: Self-pay

## 2012-09-10 DIAGNOSIS — R1011 Right upper quadrant pain: Secondary | ICD-10-CM | POA: Insufficient documentation

## 2012-09-10 DIAGNOSIS — R1903 Right lower quadrant abdominal swelling, mass and lump: Secondary | ICD-10-CM | POA: Insufficient documentation

## 2012-09-10 DIAGNOSIS — R748 Abnormal levels of other serum enzymes: Secondary | ICD-10-CM | POA: Insufficient documentation

## 2012-09-10 DIAGNOSIS — K7689 Other specified diseases of liver: Secondary | ICD-10-CM | POA: Insufficient documentation

## 2012-09-10 DIAGNOSIS — K5732 Diverticulitis of large intestine without perforation or abscess without bleeding: Secondary | ICD-10-CM | POA: Insufficient documentation

## 2012-09-10 DIAGNOSIS — R112 Nausea with vomiting, unspecified: Secondary | ICD-10-CM | POA: Insufficient documentation

## 2012-09-10 DIAGNOSIS — R195 Other fecal abnormalities: Secondary | ICD-10-CM | POA: Insufficient documentation

## 2012-09-10 DIAGNOSIS — K7200 Acute and subacute hepatic failure without coma: Secondary | ICD-10-CM | POA: Diagnosis not present

## 2012-09-10 MED ORDER — IOHEXOL 300 MG/ML  SOLN
80.0000 mL | Freq: Once | INTRAMUSCULAR | Status: AC | PRN
  Administered 2012-09-10: 80 mL via INTRAVENOUS

## 2012-09-10 NOTE — Telephone Encounter (Signed)
Appreciate CT is scheduled for today.  Ann [name], the order does not say anything about the right-sided abdominal mass. Radiology needs to know this so detail can be given to this area. Thanks.

## 2012-09-10 NOTE — Progress Notes (Signed)
Cc PCP 

## 2012-09-10 NOTE — Telephone Encounter (Signed)
I have spoken to [facility] and Informed her of all instructions and [name] is scheduled today at 4:45 for CT

## 2012-09-10 NOTE — Telephone Encounter (Signed)
I spoke to [place] in [place] and she will make note of this

## 2012-09-10 NOTE — Assessment & Plan Note (Addendum)
67 year old female with multiple medical problems as noted in past medical history who presents with acute onset nausea with vomiting, right upper quadrant pain, abnormal LFTs. Alkaline phosphatase in the 800s, AST in the 100s, ALT elevated as well. Bilirubin has been normal. Interestingly back in March she had normal LFTs. CT at that time without contrast showed sigmoid diverticulitis, uncomplicated. She had an EGD in 2013 by Dr. Claudette Head that showed Candida esophagitis and gastritis without H. Pylori. Gallbladder removed remotely. Recent abdominal ultrasound with no evidence of biliary dilation or liver abnormality except for hepatic steatosis. I have discussed this case with Dr. Jonette Eva and the CT tech. Will proceed with a CT of the abdomen and pelvis with reduced dose contrast to further evaluate liver and to evaluate right-sided abdominal wall mass. Patient and nursing home has been instructed to have the patient drink plenty of fluids today and tomorrow. Need to look at biliary tree and rule out hepatic lesions. If CT is unremarkable, we'll check AMA as next step. Of note, patient is on chronic low-dose prednisone.  She also has stable chronic anemia with Hemoccult-positive stool. Previously felt not to be candidate for endoscopy due to her debilitation and respiratory status. Air-contrast barium enema in January of this year showed rectal polyp and diverticulosis. She is no longer Xarelto.   Right-sided abdominal wall mass likely abscess from insulin injections. Evaluate with CT due to size.  Further recommendations to follow.  Greater than 30 minutes spent reviewing patient's medical record.

## 2012-09-10 NOTE — Telephone Encounter (Signed)
Discussed case with Dr. Darrick Penna. I also spoke to the CT tech at the hospital regarding her baseline creatinine of 1.5. Her GFR is about 35. They can do a reduced dose contrast.  Please schedule CT of the abdomen and pelvis with contrast as soon as possible to further evaluate new-onset abnormal LFTs, large right-sided abdominal mass, nausea vomiting.  Creatinine on 09/07/12 was 1.5.  Please notify nursing home/husband that she will need to drink lots of water before and after the CT to flush the dye out. Please inform husband of plan.

## 2012-09-10 NOTE — Telephone Encounter (Signed)
I have called and made Ann [name] aware. He appreciated the call and said they would have to do it at the nursing home because he will not be there. But said he does want to know the results.

## 2012-09-10 NOTE — Telephone Encounter (Signed)
Called and spoke to [place]. She is aware that husband won't be there to help give pt water and she is working on that. Pt has already drank two cups of fluids.

## 2012-09-11 LAB — GLUCOSE, CAPILLARY
Glucose-Capillary: 124 mg/dL — ABNORMAL HIGH (ref 70–99)
Glucose-Capillary: 157 mg/dL — ABNORMAL HIGH (ref 70–99)
Glucose-Capillary: 83 mg/dL (ref 70–99)
Glucose-Capillary: 140 mg/dL — ABNORMAL HIGH (ref 70–99)
Glucose-Capillary: 173 mg/dL — ABNORMAL HIGH (ref 70–99)
Glucose-Capillary: 122 mg/dL — ABNORMAL HIGH (ref 70–99)

## 2012-09-12 LAB — GLUCOSE, CAPILLARY
Glucose-Capillary: 89 mg/dL (ref 70–99)
Glucose-Capillary: 105 mg/dL — ABNORMAL HIGH (ref 70–99)

## 2012-09-13 LAB — GLUCOSE, CAPILLARY
Glucose-Capillary: 86 mg/dL (ref 70–99)
Glucose-Capillary: 123 mg/dL — ABNORMAL HIGH (ref 70–99)

## 2012-09-14 ENCOUNTER — Encounter (HOSPITAL_COMMUNITY): Payer: Self-pay | Admitting: *Deleted

## 2012-09-14 ENCOUNTER — Emergency Department (HOSPITAL_COMMUNITY): Payer: Medicare Other

## 2012-09-14 ENCOUNTER — Non-Acute Institutional Stay (SKILLED_NURSING_FACILITY): Payer: Medicare Other | Admitting: Internal Medicine

## 2012-09-14 ENCOUNTER — Inpatient Hospital Stay (HOSPITAL_COMMUNITY)
Admission: EM | Admit: 2012-09-14 | Discharge: 2012-09-22 | DRG: 074 | Disposition: A | Discharge status: Home Health | Payer: Medicare Other | Attending: Pulmonary Disease | Admitting: Pulmonary Disease

## 2012-09-14 DIAGNOSIS — N183 Chronic kidney disease, stage 3 unspecified: Secondary | ICD-10-CM | POA: Diagnosis present

## 2012-09-14 DIAGNOSIS — G8929 Other chronic pain: Secondary | ICD-10-CM | POA: Diagnosis present

## 2012-09-14 DIAGNOSIS — R1115 Cyclical vomiting syndrome unrelated to migraine: Secondary | ICD-10-CM | POA: Diagnosis not present

## 2012-09-14 DIAGNOSIS — IMO0002 Reserved for concepts with insufficient information to code with codable children: Secondary | ICD-10-CM

## 2012-09-14 DIAGNOSIS — N189 Chronic kidney disease, unspecified: Secondary | ICD-10-CM | POA: Diagnosis not present

## 2012-09-14 DIAGNOSIS — Z79899 Other long term (current) drug therapy: Secondary | ICD-10-CM

## 2012-09-14 DIAGNOSIS — E1165 Type 2 diabetes mellitus with hyperglycemia: Secondary | ICD-10-CM | POA: Diagnosis present

## 2012-09-14 DIAGNOSIS — L03116 Cellulitis of left lower limb: Secondary | ICD-10-CM | POA: Diagnosis present

## 2012-09-14 DIAGNOSIS — K7689 Other specified diseases of liver: Secondary | ICD-10-CM | POA: Diagnosis not present

## 2012-09-14 DIAGNOSIS — J449 Chronic obstructive pulmonary disease, unspecified: Secondary | ICD-10-CM | POA: Diagnosis present

## 2012-09-14 DIAGNOSIS — R112 Nausea with vomiting, unspecified: Secondary | ICD-10-CM | POA: Diagnosis not present

## 2012-09-14 DIAGNOSIS — I5032 Chronic diastolic (congestive) heart failure: Secondary | ICD-10-CM | POA: Diagnosis not present

## 2012-09-14 DIAGNOSIS — I509 Heart failure, unspecified: Secondary | ICD-10-CM | POA: Diagnosis present

## 2012-09-14 DIAGNOSIS — F411 Generalized anxiety disorder: Secondary | ICD-10-CM | POA: Diagnosis present

## 2012-09-14 DIAGNOSIS — I482 Chronic atrial fibrillation, unspecified: Secondary | ICD-10-CM | POA: Diagnosis present

## 2012-09-14 DIAGNOSIS — K2990 Gastroduodenitis, unspecified, without bleeding: Secondary | ICD-10-CM | POA: Diagnosis not present

## 2012-09-14 DIAGNOSIS — E2749 Other adrenocortical insufficiency: Secondary | ICD-10-CM | POA: Diagnosis not present

## 2012-09-14 DIAGNOSIS — I129 Hypertensive chronic kidney disease with stage 1 through stage 4 chronic kidney disease, or unspecified chronic kidney disease: Secondary | ICD-10-CM | POA: Diagnosis present

## 2012-09-14 DIAGNOSIS — R7989 Other specified abnormal findings of blood chemistry: Secondary | ICD-10-CM | POA: Diagnosis not present

## 2012-09-14 DIAGNOSIS — B952 Enterococcus as the cause of diseases classified elsewhere: Secondary | ICD-10-CM | POA: Diagnosis present

## 2012-09-14 DIAGNOSIS — N390 Urinary tract infection, site not specified: Secondary | ICD-10-CM | POA: Diagnosis not present

## 2012-09-14 DIAGNOSIS — K2100 Gastro-esophageal reflux disease with esophagitis, without bleeding: Secondary | ICD-10-CM | POA: Diagnosis present

## 2012-09-14 DIAGNOSIS — M1990 Unspecified osteoarthritis, unspecified site: Secondary | ICD-10-CM | POA: Diagnosis present

## 2012-09-14 DIAGNOSIS — L02419 Cutaneous abscess of limb, unspecified: Secondary | ICD-10-CM | POA: Diagnosis present

## 2012-09-14 DIAGNOSIS — J4489 Other specified chronic obstructive pulmonary disease: Secondary | ICD-10-CM | POA: Diagnosis present

## 2012-09-14 DIAGNOSIS — K3184 Gastroparesis: Secondary | ICD-10-CM | POA: Diagnosis not present

## 2012-09-14 DIAGNOSIS — E785 Hyperlipidemia, unspecified: Secondary | ICD-10-CM | POA: Diagnosis present

## 2012-09-14 DIAGNOSIS — Z8249 Family history of ischemic heart disease and other diseases of the circulatory system: Secondary | ICD-10-CM

## 2012-09-14 DIAGNOSIS — K319 Disease of stomach and duodenum, unspecified: Secondary | ICD-10-CM | POA: Diagnosis not present

## 2012-09-14 DIAGNOSIS — J441 Chronic obstructive pulmonary disease with (acute) exacerbation: Secondary | ICD-10-CM | POA: Diagnosis not present

## 2012-09-14 DIAGNOSIS — M549 Dorsalgia, unspecified: Secondary | ICD-10-CM | POA: Diagnosis present

## 2012-09-14 DIAGNOSIS — J438 Other emphysema: Secondary | ICD-10-CM | POA: Diagnosis not present

## 2012-09-14 DIAGNOSIS — R601 Generalized edema: Secondary | ICD-10-CM | POA: Diagnosis present

## 2012-09-14 DIAGNOSIS — I2510 Atherosclerotic heart disease of native coronary artery without angina pectoris: Secondary | ICD-10-CM | POA: Diagnosis present

## 2012-09-14 DIAGNOSIS — N1330 Unspecified hydronephrosis: Secondary | ICD-10-CM | POA: Diagnosis present

## 2012-09-14 DIAGNOSIS — I4891 Unspecified atrial fibrillation: Secondary | ICD-10-CM | POA: Diagnosis present

## 2012-09-14 DIAGNOSIS — E1149 Type 2 diabetes mellitus with other diabetic neurological complication: Principal | ICD-10-CM | POA: Diagnosis present

## 2012-09-14 DIAGNOSIS — E876 Hypokalemia: Secondary | ICD-10-CM | POA: Diagnosis present

## 2012-09-14 DIAGNOSIS — E039 Hypothyroidism, unspecified: Secondary | ICD-10-CM | POA: Diagnosis present

## 2012-09-14 DIAGNOSIS — H409 Unspecified glaucoma: Secondary | ICD-10-CM | POA: Diagnosis present

## 2012-09-14 DIAGNOSIS — D126 Benign neoplasm of colon, unspecified: Secondary | ICD-10-CM | POA: Diagnosis present

## 2012-09-14 DIAGNOSIS — Z800 Family history of malignant neoplasm of digestive organs: Secondary | ICD-10-CM

## 2012-09-14 DIAGNOSIS — Z87891 Personal history of nicotine dependence: Secondary | ICD-10-CM

## 2012-09-14 DIAGNOSIS — Z6841 Body Mass Index (BMI) 40.0 and over, adult: Secondary | ICD-10-CM

## 2012-09-14 DIAGNOSIS — I872 Venous insufficiency (chronic) (peripheral): Secondary | ICD-10-CM | POA: Diagnosis present

## 2012-09-14 DIAGNOSIS — E2740 Unspecified adrenocortical insufficiency: Secondary | ICD-10-CM | POA: Diagnosis present

## 2012-09-14 DIAGNOSIS — Z7982 Long term (current) use of aspirin: Secondary | ICD-10-CM

## 2012-09-14 DIAGNOSIS — Z794 Long term (current) use of insulin: Secondary | ICD-10-CM

## 2012-09-14 DIAGNOSIS — E1142 Type 2 diabetes mellitus with diabetic polyneuropathy: Secondary | ICD-10-CM | POA: Diagnosis present

## 2012-09-14 DIAGNOSIS — Z86718 Personal history of other venous thrombosis and embolism: Secondary | ICD-10-CM

## 2012-09-14 DIAGNOSIS — K2970 Gastritis, unspecified, without bleeding: Secondary | ICD-10-CM | POA: Diagnosis present

## 2012-09-14 DIAGNOSIS — R110 Nausea: Secondary | ICD-10-CM | POA: Diagnosis not present

## 2012-09-14 DIAGNOSIS — Z7901 Long term (current) use of anticoagulants: Secondary | ICD-10-CM

## 2012-09-14 DIAGNOSIS — D649 Anemia, unspecified: Secondary | ICD-10-CM | POA: Diagnosis present

## 2012-09-14 DIAGNOSIS — K219 Gastro-esophageal reflux disease without esophagitis: Secondary | ICD-10-CM | POA: Diagnosis present

## 2012-09-14 DIAGNOSIS — R51 Headache: Secondary | ICD-10-CM | POA: Diagnosis present

## 2012-09-14 DIAGNOSIS — K209 Esophagitis, unspecified without bleeding: Secondary | ICD-10-CM | POA: Diagnosis present

## 2012-09-14 DIAGNOSIS — D509 Iron deficiency anemia, unspecified: Secondary | ICD-10-CM | POA: Diagnosis present

## 2012-09-14 DIAGNOSIS — Z9861 Coronary angioplasty status: Secondary | ICD-10-CM

## 2012-09-14 DIAGNOSIS — K5730 Diverticulosis of large intestine without perforation or abscess without bleeding: Secondary | ICD-10-CM | POA: Diagnosis not present

## 2012-09-14 DIAGNOSIS — R600 Localized edema: Secondary | ICD-10-CM | POA: Diagnosis present

## 2012-09-14 LAB — URINALYSIS, ROUTINE W REFLEX MICROSCOPIC
Glucose, UA: NEGATIVE mg/dL
pH: 6 (ref 5.0–8.0)

## 2012-09-14 LAB — COMPREHENSIVE METABOLIC PANEL
ALT: 48 U/L — AB (ref 7–35)
ALT: 49 U/L — AB (ref 7–35)
ALT: 46 U/L — ABNORMAL HIGH (ref 0–35)
AST: 116 U/L
AST: 156 U/L
AST: 105 U/L — ABNORMAL HIGH (ref 0–37)
Albumin: 1.9
Alkaline Phosphatase: 653 U/L
Alkaline Phosphatase: 509 U/L — ABNORMAL HIGH (ref 39–117)
CO2: 28 mEq/L (ref 19–32)
Calcium: 8.2 mg/dL
Chloride: 98 mEq/L (ref 96–112)
GFR calc non Af Amer: 32 mL/min — ABNORMAL LOW (ref >90)
Hepatitis B Surface Antigen: NEGATIVE
Potassium: 3.7 mEq/L (ref 3.5–5.1)
Sodium: 138 mEq/L (ref 135–145)
Total Bilirubin: 1 mg/dL
Total Bilirubin: 0.9 mg/dL (ref 0.3–1.2)
Total Protein: 5.2 g/dL
Total Protein: 5.9 g/dL

## 2012-09-14 LAB — CBC WITH DIFFERENTIAL/PLATELET
Basophils Absolute: 0 10*3/uL (ref 0.0–0.1)
HCT: 29 % — ABNORMAL LOW (ref 36.0–46.0)
Lymphocytes Relative: 6 % — ABNORMAL LOW (ref 12–46)
Monocytes Absolute: 1.2 10*3/uL — ABNORMAL HIGH (ref 0.1–1.0)
Neutro Abs: 13.6 10*3/uL — ABNORMAL HIGH (ref 1.7–7.7)
RDW: 22.2 % — ABNORMAL HIGH (ref 11.5–15.5)
WBC: 16.3 10*3/uL — ABNORMAL HIGH (ref 4.0–10.5)

## 2012-09-14 MED ORDER — MORPHINE SULFATE 4 MG/ML IJ SOLN
INTRAMUSCULAR | Status: AC
  Administered 2012-09-14: 4 mg via INTRAVENOUS
  Filled 2012-09-14: qty 1

## 2012-09-14 MED ORDER — VITAMIN B-12 1000 MCG PO TABS
1000.0000 ug | ORAL_TABLET | Freq: Every day | ORAL | Status: DC
  Administered 2012-09-15 – 2012-09-22 (×7): 1000 ug via ORAL
  Filled 2012-09-14 (×7): qty 1

## 2012-09-14 MED ORDER — CIPROFLOXACIN IN D5W 200 MG/100ML IV SOLN
INTRAVENOUS | Status: AC
  Filled 2012-09-14: qty 100

## 2012-09-14 MED ORDER — ONDANSETRON HCL 4 MG/2ML IJ SOLN
4.0000 mg | Freq: Once | INTRAMUSCULAR | Status: AC
  Administered 2012-09-14: 4 mg via INTRAVENOUS
  Filled 2012-09-14: qty 2

## 2012-09-14 MED ORDER — DEXTROSE 5 % IV SOLN
1.0000 g | Freq: Once | INTRAVENOUS | Status: AC
  Administered 2012-09-14: 1 g via INTRAVENOUS
  Filled 2012-09-14 (×2): qty 10

## 2012-09-14 MED ORDER — FLUTICASONE PROPIONATE 50 MCG/ACT NA SUSP
2.0000 | Freq: Every day | NASAL | Status: DC
  Administered 2012-09-15 – 2012-09-22 (×8): 2 via NASAL
  Filled 2012-09-14 (×2): qty 16

## 2012-09-14 MED ORDER — ASPIRIN 81 MG PO CHEW: 81.0000 mg | CHEWABLE_TABLET | Freq: Every day | ORAL | Status: DC

## 2012-09-14 MED ORDER — INSULIN DETEMIR 100 UNIT/ML ~~LOC~~ SOLN
10.0000 [IU] | Freq: Every day | SUBCUTANEOUS | Status: DC
  Administered 2012-09-15 – 2012-09-20 (×6): 10 [IU] via SUBCUTANEOUS
  Filled 2012-09-14 (×9): qty 0.1

## 2012-09-14 MED ORDER — CIPROFLOXACIN IN D5W 200 MG/100ML IV SOLN
200.0000 mg | Freq: Two times a day (BID) | INTRAVENOUS | Status: DC
  Filled 2012-09-14: qty 100

## 2012-09-14 MED ORDER — POLYETHYLENE GLYCOL 3350 17 G PO PACK: 17.0000 g | PACK | Freq: Every day | ORAL | Status: DC

## 2012-09-14 MED ORDER — ENOXAPARIN SODIUM 40 MG/0.4ML ~~LOC~~ SOLN
40.0000 mg | SUBCUTANEOUS | Status: DC
Start: 2012-09-14 — End: 2012-09-16
  Administered 2012-09-14 – 2012-09-15 (×2): 40 mg via SUBCUTANEOUS
  Filled 2012-09-14 (×2): qty 0.4

## 2012-09-14 MED ORDER — MORPHINE SULFATE 4 MG/ML IJ SOLN
INTRAMUSCULAR | Status: AC
  Filled 2012-09-14: qty 1

## 2012-09-14 MED ORDER — DEXTROSE 5 % IV SOLN
1.0000 g | INTRAVENOUS | Status: DC
  Administered 2012-09-15 – 2012-09-19 (×5): 1 g via INTRAVENOUS
  Filled 2012-09-14 (×6): qty 10

## 2012-09-14 MED ORDER — PREDNISONE 10 MG PO TABS: 5.0000 mg | ORAL_TABLET | Freq: Every day | ORAL | Status: DC

## 2012-09-14 MED ORDER — GABAPENTIN 100 MG PO CAPS
100.0000 mg | ORAL_CAPSULE | Freq: Three times a day (TID) | ORAL | Status: DC
  Administered 2012-09-14 – 2012-09-22 (×22): 100 mg via ORAL
  Filled 2012-09-14 (×22): qty 1

## 2012-09-14 MED ORDER — DILTIAZEM HCL 60 MG PO TABS: 120.0000 mg | ORAL_TABLET | Freq: Every day | ORAL | Status: DC

## 2012-09-14 MED ORDER — HYDROCODONE-ACETAMINOPHEN 10-325 MG PO TABS
1.0000 | ORAL_TABLET | Freq: Four times a day (QID) | ORAL | Status: DC | PRN
  Administered 2012-09-14 – 2012-09-19 (×4): 1 via ORAL
  Filled 2012-09-14 (×4): qty 1

## 2012-09-14 MED ORDER — PANTOPRAZOLE SODIUM 40 MG PO TBEC: 40.0000 mg | DELAYED_RELEASE_TABLET | Freq: Every day | ORAL | Status: DC

## 2012-09-14 MED ORDER — CIPROFLOXACIN IN D5W 200 MG/100ML IV SOLN
200.0000 mg | Freq: Two times a day (BID) | INTRAVENOUS | Status: DC
  Administered 2012-09-14 – 2012-09-20 (×12): 200 mg via INTRAVENOUS
  Filled 2012-09-14 (×14): qty 100

## 2012-09-14 MED ORDER — ONDANSETRON HCL 4 MG PO TABS
4.0000 mg | ORAL_TABLET | Freq: Four times a day (QID) | ORAL | Status: DC | PRN
  Administered 2012-09-18: 4 mg via ORAL
  Filled 2012-09-14: qty 1

## 2012-09-14 MED ORDER — PREDNISONE 10 MG PO TABS
5.0000 mg | ORAL_TABLET | Freq: Every day | ORAL | Status: DC
  Administered 2012-09-15 – 2012-09-19 (×4): 5 mg via ORAL
  Filled 2012-09-14 (×4): qty 1

## 2012-09-14 MED ORDER — ENOXAPARIN SODIUM 40 MG/0.4ML ~~LOC~~ SOLN: 40.0000 mg | SUBCUTANEOUS | Status: DC

## 2012-09-14 MED ORDER — ATORVASTATIN CALCIUM 40 MG PO TABS
80.0000 mg | ORAL_TABLET | Freq: Every day | ORAL | Status: DC
  Administered 2012-09-15: 80 mg via ORAL
  Filled 2012-09-14: qty 2
  Filled 2012-09-14 (×2): qty 1

## 2012-09-14 MED ORDER — ASPIRIN 81 MG PO CHEW
81.0000 mg | CHEWABLE_TABLET | Freq: Every day | ORAL | Status: DC
  Administered 2012-09-15 – 2012-09-22 (×7): 81 mg via ORAL
  Filled 2012-09-14 (×7): qty 1

## 2012-09-14 MED ORDER — CALCITRIOL 0.25 MCG PO CAPS
0.2500 ug | ORAL_CAPSULE | Freq: Every day | ORAL | Status: DC
  Administered 2012-09-15 – 2012-09-22 (×7): 0.25 ug via ORAL
  Filled 2012-09-14 (×7): qty 1

## 2012-09-14 MED ORDER — SODIUM CHLORIDE 0.9 % IV BOLUS (SEPSIS)
500.0000 mL | Freq: Once | INTRAVENOUS | Status: AC
  Administered 2012-09-14: 500 mL via INTRAVENOUS

## 2012-09-14 MED ORDER — FESOTERODINE FUMARATE ER 8 MG PO TB24
8.0000 mg | ORAL_TABLET | Freq: Every day | ORAL | Status: DC
  Administered 2012-09-15 – 2012-09-22 (×7): 8 mg via ORAL
  Filled 2012-09-14 (×9): qty 1

## 2012-09-14 MED ORDER — PRO-STAT SUGAR FREE PO LIQD
30.0000 mL | Freq: Three times a day (TID) | ORAL | Status: DC
  Administered 2012-09-15 – 2012-09-22 (×5): 30 mL via ORAL
  Filled 2012-09-14 (×10): qty 30

## 2012-09-14 MED ORDER — PANTOPRAZOLE SODIUM 40 MG PO TBEC
40.0000 mg | DELAYED_RELEASE_TABLET | Freq: Every day | ORAL | Status: DC
  Administered 2012-09-14 – 2012-09-15 (×2): 40 mg via ORAL
  Filled 2012-09-14 (×2): qty 1

## 2012-09-14 MED ORDER — ALBUTEROL SULFATE (5 MG/ML) 0.5% IN NEBU
5.0000 mg | INHALATION_SOLUTION | Freq: Once | RESPIRATORY_TRACT | Status: AC
  Administered 2012-09-14: 5 mg via RESPIRATORY_TRACT
  Filled 2012-09-14: qty 1

## 2012-09-14 MED ORDER — ACETAMINOPHEN 650 MG RE SUPP: 650.0000 mg | Freq: Four times a day (QID) | RECTAL | Status: DC | PRN

## 2012-09-14 MED ORDER — POLYETHYLENE GLYCOL 3350 17 G PO PACK
17.0000 g | PACK | Freq: Every day | ORAL | Status: DC
  Administered 2012-09-17 – 2012-09-22 (×6): 17 g via ORAL
  Filled 2012-09-14 (×6): qty 1

## 2012-09-14 MED ORDER — ONDANSETRON HCL 4 MG/2ML IJ SOLN: 4.0000 mg | Freq: Four times a day (QID) | INTRAMUSCULAR | Status: DC | PRN

## 2012-09-14 MED ORDER — SODIUM CHLORIDE 0.9 % IJ SOLN
3.0000 mL | Freq: Two times a day (BID) | INTRAMUSCULAR | Status: DC
  Administered 2012-09-15 – 2012-09-18 (×4): 3 mL via INTRAVENOUS

## 2012-09-14 MED ORDER — MORPHINE SULFATE 2 MG/ML IJ SOLN: 2.0000 mg | INTRAMUSCULAR | Status: DC | PRN

## 2012-09-14 MED ORDER — IOHEXOL 300 MG/ML  SOLN
50.0000 mL | Freq: Once | INTRAMUSCULAR | Status: AC | PRN
  Administered 2012-09-14: 50 mL via ORAL

## 2012-09-14 MED ORDER — MORPHINE SULFATE 4 MG/ML IJ SOLN: 4.0000 mg | Freq: Once | INTRAMUSCULAR | Status: AC

## 2012-09-14 MED ORDER — ALPRAZOLAM 0.5 MG PO TABS
0.5000 mg | ORAL_TABLET | Freq: Three times a day (TID) | ORAL | Status: DC | PRN
  Administered 2012-09-19 – 2012-09-20 (×3): 0.5 mg via ORAL
  Filled 2012-09-14 (×3): qty 1

## 2012-09-14 MED ORDER — DILTIAZEM HCL 60 MG PO TABS
120.0000 mg | ORAL_TABLET | Freq: Every day | ORAL | Status: DC
  Administered 2012-09-15 – 2012-09-22 (×7): 120 mg via ORAL
  Filled 2012-09-14 (×7): qty 2

## 2012-09-14 MED ORDER — TORSEMIDE 20 MG PO TABS
80.0000 mg | ORAL_TABLET | Freq: Two times a day (BID) | ORAL | Status: DC
  Administered 2012-09-14 – 2012-09-22 (×15): 80 mg via ORAL
  Filled 2012-09-14 (×3): qty 4
  Filled 2012-09-14: qty 1
  Filled 2012-09-14 (×4): qty 4
  Filled 2012-09-14: qty 3
  Filled 2012-09-14: qty 1
  Filled 2012-09-14 (×5): qty 4
  Filled 2012-09-14: qty 3
  Filled 2012-09-14: qty 4

## 2012-09-14 MED ORDER — MORPHINE SULFATE 4 MG/ML IJ SOLN
4.0000 mg | Freq: Once | INTRAMUSCULAR | Status: AC
  Administered 2012-09-14: 4 mg via INTRAVENOUS

## 2012-09-14 MED ORDER — SODIUM CHLORIDE 0.9 % IV SOLN
INTRAVENOUS | Status: DC
  Administered 2012-09-15 – 2012-09-22 (×7): via INTRAVENOUS

## 2012-09-14 MED ORDER — LEVOTHYROXINE SODIUM 100 MCG PO TABS
200.0000 ug | ORAL_TABLET | Freq: Every day | ORAL | Status: DC
  Administered 2012-09-15 – 2012-09-22 (×8): 200 ug via ORAL
  Filled 2012-09-14 (×4): qty 2
  Filled 2012-09-14: qty 1
  Filled 2012-09-14 (×4): qty 2
  Filled 2012-09-14: qty 1

## 2012-09-14 MED ORDER — ACETAMINOPHEN 325 MG PO TABS
650.0000 mg | ORAL_TABLET | Freq: Four times a day (QID) | ORAL | Status: DC | PRN
  Administered 2012-09-18: 650 mg via ORAL
  Filled 2012-09-14: qty 2

## 2012-09-14 MED ORDER — ALBUTEROL SULFATE (5 MG/ML) 0.5% IN NEBU: 2.5000 mg | INHALATION_SOLUTION | RESPIRATORY_TRACT | Status: DC | PRN
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
## 2012-09-14 NOTE — Progress Notes (Signed)
Patient ID: Ann [name], female   DOB: 04/03/1946, 67 y.o.   MRN: [id number] Chief complaint; refractory nausea and vomiting in the setting of abnormal liver function tests cholestatic pattern.  History; this is a patient who continues not to do well. I had initially seen her on on 07/27/24/2014. She has had a multitude of medical issues and was in the hospital from 07/30/2012 through 08/12/2012 with a severe cellulitis of her left leg was felt to be septic shock. She has COPD on chronic oxygen severe diastolic left ventricular and right ventricular failure. We have been progressively diarrhea seen her with first IV Lasix and then oral Demadex. However, she is starting to have elevated liver function tests with an alkaline phosphatase in the 800 range. Her AST and ALT are slightly elevated as well. She has been to see Dr. Kendell Bane, and had a repeat CT scan that showed increased severe hepatic steatosis consistent with acute steatohepatitis. The reason for this is not clear. She has also had an abdominal ultrasound that, hepatitis A and B. Serology. Her most recent lab work shows a total bilirubin of 759, AST of 150, ALT of 58, and albumin of 2.2 on 09/09/2012. Her BUN was 26, creatinine 1.32 . She is currently on Demadex 80 twice a day. He also has OB positive stool and for this reason, she is no longer on xarelto. He is Dr. Cephus Richer notes. She has had workup previously, which includes an endoscopy I., Dr. Russella Dar in 2013 that showed Candida esophagitis and gastritis without H. pylori.  Today the patient continues to look poorly. She states she can eat and drink almost nothing. Her diuretics, however, have not been adjusted over the weekend. He complains vaguely of abdominal pain per  Past medical history is reviewed.  Review of systems Respiratory no increasing shortness of breath. COPD on chronic oxygen appears stable Cardiac no clear chest pain. Abdominal nausea, vomiting, complaining of epigastric  and right upper quadrant pain. She completed a course of Diflucan earlier this month for what was felt to be candidal esophagitis.  On examination; Respiratory shallow, but otherwise clear entry. Cardiac heart sounds are regular. She does not appear to be dehydrated. Abdomen morbidly obese. Diffusely tender in the right upper quadrant and epigastric area, but no guarding or rebound. There is a small nodule in the right lower cautery, which I suspect is the remnant of the Lovenox and/or insulin injection.  Extremities; there is erythema here, but I believe most of this is chronic venous stasis with severe inflammation secondary to this. This has not changed during my time caring for  Impression/plan #1 nausea and vomiting with cholestatic elevation of her liver function tests. What is what exactly is happening to this patient is unclear to me. Her liver function tests were normal during her hospitalization and march. Recent CT scan of the abdomen makes reference to worsening hepatic steatosis. He is status post cholecystectomy. No signs of cirrhosis. No evidence of biliary ductal dilation. I think this lady will need to be admitted. The hospital to complete the evaluation of this, also to monitor her fluid volume status. She is not drinking adequately and could very easily slipped into prerenal azotemia. If she hasn't already. #2 diastolically mediated left and right ventricular failure. Most recent weight is down to 268 pounds, which represents a 37 pound weight loss since March 25. As mentioned, we may overstepped #3 COPD, fortunately, this does not seem to up that much an issue.

## 2012-09-14 NOTE — ED Notes (Signed)
Patient resident of [facility] x 1 month, began having vomiting 1.5 weeks ago.  Small amount of diarrhea last night.  Abdominal pain 8/10.  Cough started 2 nights ago. Non productive.

## 2012-09-15 DIAGNOSIS — E2749 Other adrenocortical insufficiency: Secondary | ICD-10-CM | POA: Diagnosis not present

## 2012-09-15 DIAGNOSIS — I5032 Chronic diastolic (congestive) heart failure: Secondary | ICD-10-CM | POA: Diagnosis not present

## 2012-09-15 DIAGNOSIS — E1149 Type 2 diabetes mellitus with other diabetic neurological complication: Secondary | ICD-10-CM | POA: Diagnosis not present

## 2012-09-15 DIAGNOSIS — R112 Nausea with vomiting, unspecified: Secondary | ICD-10-CM | POA: Diagnosis not present

## 2012-09-15 DIAGNOSIS — N1330 Unspecified hydronephrosis: Secondary | ICD-10-CM | POA: Diagnosis present

## 2012-09-15 DIAGNOSIS — R7989 Other specified abnormal findings of blood chemistry: Secondary | ICD-10-CM | POA: Diagnosis not present

## 2012-09-15 DIAGNOSIS — N390 Urinary tract infection, site not specified: Secondary | ICD-10-CM | POA: Diagnosis not present

## 2012-09-15 DIAGNOSIS — L02419 Cutaneous abscess of limb, unspecified: Secondary | ICD-10-CM | POA: Diagnosis not present

## 2012-09-15 LAB — HEPATIC FUNCTION PANEL
Alkaline Phosphatase: 425 U/L — ABNORMAL HIGH (ref 39–117)
Indirect Bilirubin: 0.2 mg/dL — ABNORMAL LOW (ref 0.3–0.9)
Total Bilirubin: 0.7 mg/dL (ref 0.3–1.2)

## 2012-09-15 LAB — CBC
HCT: 27.5 % — ABNORMAL LOW (ref 36.0–46.0)
Hemoglobin: 8.5 g/dL — ABNORMAL LOW (ref 12.0–15.0)
RBC: 2.65 MIL/uL — ABNORMAL LOW (ref 3.87–5.11)
WBC: 9.1 10*3/uL (ref 4.0–10.5)

## 2012-09-15 LAB — BASIC METABOLIC PANEL
BUN: 21 mg/dL (ref 6–23)
Chloride: 102 mEq/L (ref 96–112)
GFR calc Af Amer: 37 mL/min — ABNORMAL LOW (ref >90)
Potassium: 2.9 mEq/L — ABNORMAL LOW (ref 3.5–5.1)
Sodium: 141 mEq/L (ref 135–145)

## 2012-09-15 LAB — MRSA PCR SCREENING: MRSA by PCR: POSITIVE — AB

## 2012-09-15 LAB — GLUCOSE, CAPILLARY: Glucose-Capillary: 107 mg/dL — ABNORMAL HIGH (ref 70–99)

## 2012-09-15 MED ORDER — BIOTENE DRY MOUTH MT LIQD
15.0000 mL | Freq: Two times a day (BID) | OROMUCOSAL | Status: DC
  Administered 2012-09-15 – 2012-09-22 (×15): 15 mL via OROMUCOSAL

## 2012-09-15 MED ORDER — CHLORHEXIDINE GLUCONATE CLOTH 2 % EX PADS
6.0000 | MEDICATED_PAD | Freq: Every day | CUTANEOUS | Status: AC
  Administered 2012-09-16 – 2012-09-19 (×4): 6 via TOPICAL

## 2012-09-15 MED ORDER — INSULIN ASPART 100 UNIT/ML ~~LOC~~ SOLN: 0.0000 [IU] | Freq: Every day | SUBCUTANEOUS | Status: DC

## 2012-09-15 MED ORDER — MUPIROCIN 2 % EX OINT
1.0000 "application " | TOPICAL_OINTMENT | Freq: Two times a day (BID) | CUTANEOUS | Status: AC
  Administered 2012-09-15 – 2012-09-19 (×10): 1 via NASAL
  Filled 2012-09-15 (×3): qty 22

## 2012-09-15 MED ORDER — PANTOPRAZOLE SODIUM 40 MG PO TBEC
40.0000 mg | DELAYED_RELEASE_TABLET | Freq: Two times a day (BID) | ORAL | Status: DC
  Administered 2012-09-16 – 2012-09-22 (×13): 40 mg via ORAL
  Filled 2012-09-15 (×13): qty 1

## 2012-09-15 MED ORDER — INSULIN ASPART 100 UNIT/ML ~~LOC~~ SOLN
0.0000 [IU] | Freq: Three times a day (TID) | SUBCUTANEOUS | Status: DC
  Administered 2012-09-15: 3 [IU] via SUBCUTANEOUS
  Administered 2012-09-15 (×2): 4 [IU] via SUBCUTANEOUS
  Administered 2012-09-16 – 2012-09-17 (×2): 3 [IU] via SUBCUTANEOUS
  Administered 2012-09-17: 4 [IU] via SUBCUTANEOUS
  Administered 2012-09-18 – 2012-09-19 (×3): 3 [IU] via SUBCUTANEOUS
  Administered 2012-09-19 – 2012-09-20 (×3): 4 [IU] via SUBCUTANEOUS
  Administered 2012-09-20: 3 [IU] via SUBCUTANEOUS
  Administered 2012-09-21: 4 [IU] via SUBCUTANEOUS
  Administered 2012-09-21 (×2): 3 [IU] via SUBCUTANEOUS
  Administered 2012-09-22: 4 [IU] via SUBCUTANEOUS

## 2012-09-15 MED ORDER — POTASSIUM CHLORIDE 10 MEQ/100ML IV SOLN
10.0000 meq | INTRAVENOUS | Status: AC
  Administered 2012-09-15 (×5): 10 meq via INTRAVENOUS
  Filled 2012-09-15 (×2): qty 100
  Filled 2012-09-15: qty 300

## 2012-09-15 MED ORDER — POTASSIUM CHLORIDE 10 MEQ/100ML IV SOLN
INTRAVENOUS | Status: AC
  Administered 2012-09-15: 10 meq
  Filled 2012-09-15: qty 100

## 2012-09-15 NOTE — Progress Notes (Signed)
Patient c/o chest pain radiating to right side of chest,rated an 8,patient say's it happen when she started to sit up. B/P 112/66, heart rate 85, oxygen sat 91% on 4 liters.Dr fields at [facility] well as patient's family.Dr Juanetta Gosling notified.Orders received,and given.Will continue to monitor patient.
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
## 2012-09-15 NOTE — Consult Note (Signed)
Report@[id number] 

## 2012-09-15 NOTE — Progress Notes (Signed)
Subjective: She was admitted yesterday afternoon with intractable vomiting. She has multiple other medical problems including diabetes COPD coronary disease congestive heart failure and adrenal insufficiency. She has been in a skilled care facility for rehabilitation. This morning her potassium level is down. She says she is always cold. She has some sort of a hydronephrosis.  Objective: Vital signs in last 24 hours: Temp:  [97.6 F (36.4 C)-98.9 F (37.2 C)] 97.6 F (36.4 C) (04/22 0523) Pulse Rate:  [77-103] 77 (04/22 0523) Resp:  [20-25] 20 (04/22 0523) BP: (84-107)/(41-71) 104/60 mmHg (04/22 0523) SpO2:  [91 %-96 %] 95 % (04/22 0523) Weight:  [123.378 kg (272 lb)-126.6 kg (279 lb 1.6 oz)] 126.6 kg (279 lb 1.6 oz) (04/22 0523) Weight change:     Intake/Output from previous day: 04/21 0701 - 04/22 0700 In: -  Out: 1225 [Urine:1225]  PHYSICAL EXAM General appearance: alert, cooperative, mild distress and morbidly obese Resp: clear to auscultation bilaterally Cardio: regular rate and rhythm, S1, S2 normal, no murmur, click, rub or gallop GI: Mildly diffusely tender Extremities: venous stasis dermatitis noted  Lab Results:    Basic Metabolic Panel:  Recent Labs  40/98/11 1400 09/15/12 0541  NA 138 141  K 3.7 2.9*  CL 98 102  CO2 28 29  GLUCOSE 136* 93  BUN 23 21  CREATININE 1.61* 1.60*  CALCIUM 8.4 7.8*   Liver Function Tests:  Recent Labs  09/14/12 1400  AST 105*  ALT 46*  ALKPHOS 509*  BILITOT 0.9  PROT 6.4  ALBUMIN 2.1*   No results found for this basename: LIPASE, AMYLASE,  in the last 72 hours No results found for this basename: AMMONIA,  in the last 72 hours CBC:  Recent Labs  09/14/12 1400 09/15/12 0541  WBC 16.3* 9.1  NEUTROABS 13.6*  --   HGB 9.1* 8.5*  HCT 29.0* 27.5*  MCV 103.9* 103.8*  PLT 312 280   Cardiac Enzymes: No results found for this basename: CKTOTAL, CKMB, CKMBINDEX, TROPONINI,  in the last 72 hours BNP: No results  found for this basename: PROBNP,  in the last 72 hours D-Dimer: No results found for this basename: DDIMER,  in the last 72 hours CBG:  Recent Labs  09/12/12 1610 09/12/12 2157 09/13/12 0808 09/13/12 1548 09/13/12 2111 09/14/12 0759  GLUCAP 128* 105* 86 195* 123* 80   Hemoglobin A1C: No results found for this basename: HGBA1C,  in the last 72 hours Fasting Lipid Panel: No results found for this basename: CHOL, HDL, LDLCALC, TRIG, CHOLHDL, LDLDIRECT,  in the last 72 hours Thyroid Function Tests: No results found for this basename: TSH, T4TOTAL, FREET4, T3FREE, THYROIDAB,  in the last 72 hours Anemia Panel: No results found for this basename: VITAMINB12, FOLATE, FERRITIN, TIBC, IRON, RETICCTPCT,  in the last 72 hours Coagulation: No results found for this basename: LABPROT, INR,  in the last 72 hours Urine Drug Screen: Drugs of Abuse  No results found for this basename: labopia, cocainscrnur, labbenz, amphetmu, thcu, labbarb    Alcohol Level: No results found for this basename: ETH,  in the last 72 hours Urinalysis:  Recent Labs  09/14/12 1259  COLORURINE YELLOW  LABSPEC 1.020  PHURINE 6.0  GLUCOSEU NEGATIVE  HGBUR MODERATE*  BILIRUBINUR NEGATIVE  KETONESUR NEGATIVE  PROTEINUR TRACE*  UROBILINOGEN 0.2  NITRITE POSITIVE*  LEUKOCYTESUR MODERATE*   Misc. Labs:  ABGS No results found for this basename: PHART, PCO2, PO2ART, TCO2, HCO3,  in the last 72 hours CULTURES Recent Results (from  the past 240 hour(s))  MRSA PCR SCREENING     Status: Abnormal   Collection Time    09/15/12  1:49 AM      Result Value Range Status   MRSA by PCR POSITIVE (*) NEGATIVE Final   Comment:            The GeneXpert MRSA Assay (FDA     approved for NASAL specimens     only), is one component of a     comprehensive MRSA colonization     surveillance program. It is not     intended to diagnose MRSA     infection nor to guide or     monitor treatment for     MRSA infections.      RESULT CALLED TO, READ BACK BY AND VERIFIED WITH:     WEBB C AT 0350 ON [id number] BY FORSYTH K   Studies/Results: Ct Abdomen Pelvis Wo Contrast  09/14/2012  *RADIOLOGY REPORT*  Clinical Data: Left lower quadrant abdominal pain.  CT ABDOMEN AND PELVIS WITHOUT CONTRAST  Technique:  Multidetector CT imaging of the abdomen and pelvis was performed following the standard protocol without intravenous contrast.  Comparison: CT of the abdomen and pelvis 09/10/2012.  Findings:  Lung Bases: Linear opacities in the lower lobes of the lungs bilaterally, slightly increased compared to the prior examination, favored to represent a combination of atelectasis and/or scarring. Atherosclerotic calcifications within the left anterior descending and left circumflex coronary arteries.  Abdomen/Pelvis:  Severely decreased attenuation throughout the hepatic parenchyma, compatible with hepatic steatosis.  No focal hepatic lesions.  Status post cholecystectomy.  The unenhanced appearance of the pancreas, spleen, bilateral adrenal glands and the right kidney is unremarkable.  There is mild left hydroureteronephrosis which is new compared to the prior examination.  No definite stone is identified along the course of the left ureter, and no definite obstructing retroperitoneal mass is noted.  The urinary bladder is completely decompressed with a Foley balloon catheter in place.  No significant volume of ascites.  No pneumoperitoneum.  No pathologic distension of small bowel.  Numerous colonic diverticula are noted, without surrounding inflammatory changes to suggest acute diverticulitis at this time.  Status post total abdominal hysterectomy.  Ovaries are atrophic.  Musculoskeletal:  There are no aggressive appearing lytic or blastic lesions noted in the visualized portions of the skeleton. Healing fracture in the posterolateral aspect of the left eleventh rib again noted.  IMPRESSION: 1.  Colonic diverticulosis without evidence to suggest  recurrent acute diverticulitis at this time. 2.  Interval development of mild left-sided hydroureteronephrosis. This is of uncertain etiology and significance as there is no definite ureteral stone or obstructing retroperitoneal mass. Additionally, the urinary bladder is completely decompressed with a Foley balloon catheter in place. 3.  Severe hepatic steatosis. 4.  Status post cholecystectomy and hysterectomy. 5.  Atherosclerosis, including two-vessel coronary artery disease. Assessment for potential risk factor modification, dietary therapy or pharmacologic therapy may be warranted, if clinically indicated.  6.  Additional incidental findings, as above.   Original Report Authenticated By: Trudie Reed, M.D.    Dg Chest Port 1 View  09/14/2012  *RADIOLOGY REPORT*  Clinical Data: Cough, congestion, shortness of breath, history COPD, vomiting, GERD, diabetes, coronary artery disease  PORTABLE CHEST - 1 VIEW  Comparison: Portable exam 1433 hours compared to 08/10/2012  Findings: Borderline enlargement of cardiac silhouette. Left arm PICC line stable tip projecting over SVC. Mediastinal contours and pulmonary vascularity normal for lordotic positioning. Question minimal  bibasilar atelectasis. Remaining lungs clear. Underlying emphysematous changes. No pleural effusion or pneumothorax. Prior cervical spine fusion.  IMPRESSION: Question minimal bibasilar atelectasis. Otherwise negative exam.   Original Report Authenticated By: Ulyses Southward, M.D.     Medications:  Prior to Admission:  Prescriptions prior to admission  Medication Sig Dispense Refill  . ALPRAZolam (XANAX) 0.5 MG tablet Take 0.5 mg by mouth 3 (three) times daily as needed for sleep.       Marland Kitchen aspirin 81 MG chewable tablet Chew 81 mg by mouth daily.      Marland Kitchen atorvastatin (LIPITOR) 80 MG tablet Take 80 mg by mouth daily.      . calcitRIOL (ROCALTROL) 0.25 MCG capsule Take 0.25 mcg by mouth daily.      Marland Kitchen diltiazem (CARDIZEM) 120 MG tablet Take 120  mg by mouth daily.       . feeding supplement (PRO-STAT SUGAR FREE 64) LIQD Take 30 mLs by mouth 3 (three) times daily with meals.      . fesoterodine (TOVIAZ) 8 MG TB24 Take 8 mg by mouth daily.      . fluticasone (FLONASE) 50 MCG/ACT nasal spray Place 2 sprays into the nose daily.      Marland Kitchen gabapentin (NEURONTIN) 100 MG capsule Take 100 mg by mouth 3 (three) times daily.      Marland Kitchen HYDROcodone-acetaminophen (NORCO) 10-325 MG per tablet Take 1 tablet by mouth every 6 (six) hours as needed for pain.      Marland Kitchen insulin aspart (NOVOLOG) 100 UNIT/ML injection Inject 3-15 Units into the skin 3 (three) times daily before meals. 121-150=3 units, 151-200=4 units, 201-250=7 units, 251-300=9 units, 301-350=12 units, 351-400=15 units.      . insulin detemir (LEVEMIR) 100 UNIT/ML injection Inject 10 Units into the skin at bedtime.      Marland Kitchen levothyroxine (SYNTHROID, LEVOTHROID) 200 MCG tablet Take 200 mcg by mouth daily before breakfast.      . Nutritional Supplements (ENSURE NUTRITION SHAKE PO) Take by mouth at bedtime.      Marland Kitchen omeprazole (PRILOSEC) 40 MG capsule Take 40 mg by mouth 2 (two) times daily.      . ondansetron (ZOFRAN) 4 MG tablet Take 4 mg by mouth every 8 (eight) hours as needed for nausea.      . polyethylene glycol (MIRALAX / GLYCOLAX) packet Take 17 g by mouth daily.      . predniSONE (DELTASONE) 5 MG tablet Take 5 mg by mouth daily.      . promethazine (PHENERGAN) 12.5 MG tablet Take 12.5 mg by mouth every 6 (six) hours as needed for nausea.      Marland Kitchen torsemide (DEMADEX) 20 MG tablet Take 80 mg by mouth 2 (two) times daily.      . vitamin B-12 (CYANOCOBALAMIN) 1000 MCG tablet Take 1,000 mcg by mouth daily.       Scheduled: . antiseptic oral rinse  15 mL Mouth Rinse BID  . aspirin  81 mg Oral Daily  . atorvastatin  80 mg Oral Daily  . calcitRIOL  0.25 mcg Oral Daily  . cefTRIAXone (ROCEPHIN)  IV  1 g Intravenous Q24H  . Chlorhexidine Gluconate Cloth  6 each Topical Q0600  . ciprofloxacin  200 mg  Intravenous Q12H  . diltiazem  120 mg Oral Daily  . enoxaparin (LOVENOX) injection  40 mg Subcutaneous Q24H  . feeding supplement  30 mL Oral TID WC  . fesoterodine  8 mg Oral Daily  . fluticasone  2 spray Each Nare Daily  .  gabapentin  100 mg Oral TID  . insulin aspart  0-20 Units Subcutaneous TID WC  . insulin aspart  0-5 Units Subcutaneous QHS  . insulin detemir  10 Units Subcutaneous QHS  . levothyroxine  200 mcg Oral QAC breakfast  . mupirocin ointment  1 application Nasal BID  . pantoprazole  40 mg Oral Daily  . polyethylene glycol  17 g Oral Daily  . potassium chloride  10 mEq Intravenous Q1 Hr x 6  . predniSONE  5 mg Oral Daily  . sodium chloride  3 mL Intravenous Q12H  . torsemide  80 mg Oral BID  . vitamin B-12  1,000 mcg Oral Daily   Continuous: . sodium chloride     ZOX:[id number], acetaminophen, albuterol, ALPRAZolam, HYDROcodone-acetaminophen, morphine injection, ondansetron (ZOFRAN) IV, ondansetron  Assesment: She was admitted with intractable vomiting. She has multiple other medical problems. Her potassium is low this morning. She complains of being cold so I will check her thyroid. She is on thyroid replacement. Active Problems:   * No active hospital problems. *    Plan: GI and urology consultation. I will replace her potassium.    LOS: 1 day   Ariyanah Aguado L 09/15/2012, 8:54 AM

## 2012-09-15 NOTE — Consult Note (Signed)
REVIEWED.  1700: Pt experiencing substernal chest pain. Pain increased with deep breath. STAT EKG ORDERED. PP NOTIFIED. PT WILL NEED NEG CARDIAC EVALUATION PRIOR TO EGD. LIVER ENZYMES IMPROVED. MIXED PATTERN. PT NEED EVALUATED FOR OVERLAP SYNDROME AND PBC. SEROLOGIES ON APR 23.

## 2012-09-15 NOTE — Clinical Social Work Psychosocial (Signed)
Clinical Social Work Department BRIEF PSYCHOSOCIAL ASSESSMENT 09/15/2012  Patient:  Ann [name], Ann [name]     Account Number:  [id number]     Admit date:  09/14/2012  Clinical Social Worker:  Nancie Neas  Date/Time:  09/15/2012 10:35 AM  Referred by:  Physician  Date Referred:  09/15/2012 Referred for  SNF Placement   Other Referral:   Interview type:  Patient Other interview type:   and husband- Ann [name]    PSYCHOSOCIAL DATA Living Status:  FACILITY Admitted from facility:  [facility] Level of care:  Skilled Nursing Facility Primary support name:  Ann [name] Primary support relationship to patient:  SPOUSE Degree of support available:   suppotive    CURRENT CONCERNS Current Concerns  Post-Acute Placement   Other Concerns:    SOCIAL WORK ASSESSMENT / PLAN CSW met with pt and pt's husband at bedside. Pt known to CSW from previous admission. Pt d/c to [facility] about a month ago for rehab. She is alert and oriented and states that she has progressed well with therapy. Family involved and supportive. Per Tammi at facility, okay to return pending bed availability. No FL2 needed. Pt and husband would like to see how pt progresses before making decision about returning to SNF or not.   Assessment/plan status:  Psychosocial Support/Ongoing Assessment of Needs Other assessment/ plan:   Information/referral to community resources:   [facility]    PATIENT'S/FAMILY'S RESPONSE TO PLAN OF CARE: Pt and pt's husband report positive feelings regarding return to [facility] if needed at d/c. Pt states she really liked the therapy and is feeling stronger. CSW to continue to follow.        Derenda Fennel, [place] [phone]
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
## 2012-09-15 NOTE — Care Management Note (Signed)
   CARE MANAGEMENT NOTE 09/15/2012  Patient:  CAYMAN, BROGDEN   Account Number:  [id number]  Date Initiated:  09/15/2012  Documentation initiated by:  Sharrie Rothman  Subjective/Objective Assessment:   Pt admitted from [facility]. Pt and pts husband not sure at this time if they want to return to SNF at discharge. If returns home will need HH at discharge.     Action/Plan:   Will continue to follow for [facility] needs if pt returns home. If pt returns to [facility], CSW will arrange discharge to facility.   Anticipated DC Date:  09/21/2012   Anticipated DC Plan:  SKILLED NURSING FACILITY  In-house referral  Clinical Social Worker      DC Planning Services  CM consult      Choice offered to / List presented to:             Status of service:  In process, will continue to follow Medicare Important Message given?   (If response is "NO", the following Medicare IM given date fields will be blank) Date Medicare IM given:   Date Additional Medicare IM given:    Discharge Disposition:    Per UR Regulation:    If discussed at Long Length of Stay Meetings, dates discussed:    Comments:  09/15/12 1411 Arlyss Queen, RN BSN CM

## 2012-09-15 NOTE — Progress Notes (Signed)
Quick Note:  Patient currently inpatient at [facility] and being followed by our group. ______

## 2012-09-16 ENCOUNTER — Encounter (HOSPITAL_COMMUNITY)
Admission: EM | Disposition: A | Discharge status: Home Health | Payer: Self-pay | Source: Home / Self Care | Attending: Pulmonary Disease

## 2012-09-16 ENCOUNTER — Encounter (HOSPITAL_COMMUNITY): Payer: Self-pay | Admitting: *Deleted

## 2012-09-16 DIAGNOSIS — K2100 Gastro-esophageal reflux disease with esophagitis, without bleeding: Secondary | ICD-10-CM

## 2012-09-16 DIAGNOSIS — R7989 Other specified abnormal findings of blood chemistry: Secondary | ICD-10-CM | POA: Diagnosis not present

## 2012-09-16 DIAGNOSIS — R110 Nausea: Secondary | ICD-10-CM

## 2012-09-16 DIAGNOSIS — R112 Nausea with vomiting, unspecified: Secondary | ICD-10-CM | POA: Diagnosis not present

## 2012-09-16 DIAGNOSIS — K2990 Gastroduodenitis, unspecified, without bleeding: Secondary | ICD-10-CM

## 2012-09-16 DIAGNOSIS — K319 Disease of stomach and duodenum, unspecified: Secondary | ICD-10-CM | POA: Diagnosis not present

## 2012-09-16 DIAGNOSIS — K2970 Gastritis, unspecified, without bleeding: Secondary | ICD-10-CM

## 2012-09-16 DIAGNOSIS — E876 Hypokalemia: Secondary | ICD-10-CM | POA: Diagnosis not present

## 2012-09-16 HISTORY — PX: ESOPHAGOGASTRODUODENOSCOPY: SHX5428

## 2012-09-16 LAB — CBC WITH DIFFERENTIAL/PLATELET
Eosinophils Absolute: 0.7 10*3/uL (ref 0.0–0.7)
Hemoglobin: 9.2 g/dL — ABNORMAL LOW (ref 12.0–15.0)
Lymphocytes Relative: 20 % (ref 12–46)
Lymphs Abs: 1.7 10*3/uL (ref 0.7–4.0)
MCH: 32.1 pg (ref 26.0–34.0)
MCV: 104.9 fL — ABNORMAL HIGH (ref 78.0–100.0)
Monocytes Relative: 14 % — ABNORMAL HIGH (ref 3–12)
Neutrophils Relative %: 58 % (ref 43–77)
RBC: 2.87 MIL/uL — ABNORMAL LOW (ref 3.87–5.11)

## 2012-09-16 LAB — HEPATIC FUNCTION PANEL
ALT: 45 U/L — ABNORMAL HIGH (ref 0–35)
AST: 110 U/L — ABNORMAL HIGH (ref 0–37)
Alkaline Phosphatase: 447 U/L — ABNORMAL HIGH (ref 39–117)
Bilirubin, Direct: 0.4 mg/dL — ABNORMAL HIGH (ref 0.0–0.3)
Total Bilirubin: 0.6 mg/dL (ref 0.3–1.2)

## 2012-09-16 LAB — BASIC METABOLIC PANEL
CO2: 31 mEq/L (ref 19–32)
GFR calc non Af Amer: 41 mL/min — ABNORMAL LOW (ref >90)
Glucose, Bld: 90 mg/dL (ref 70–99)
Potassium: 3 mEq/L — ABNORMAL LOW (ref 3.5–5.1)
Sodium: 144 mEq/L (ref 135–145)

## 2012-09-16 LAB — GLUCOSE, CAPILLARY
Glucose-Capillary: 90 mg/dL (ref 70–99)
Glucose-Capillary: 119 mg/dL — ABNORMAL HIGH (ref 70–99)
Glucose-Capillary: 118 mg/dL — ABNORMAL HIGH (ref 70–99)

## 2012-09-16 LAB — HEPATITIS C ANTIBODY: HCV Ab: NEGATIVE

## 2012-09-16 LAB — MITOCHONDRIAL ANTIBODIES: Mitochondrial M2 Ab, IgG: 1.1 — ABNORMAL HIGH (ref <0.91)

## 2012-09-16 SURGERY — EGD (ESOPHAGOGASTRODUODENOSCOPY)
Anesthesia: Moderate Sedation

## 2012-09-16 MED ORDER — STERILE WATER FOR IRRIGATION IR SOLN
Status: DC | PRN
  Administered 2012-09-16: 15:00:00

## 2012-09-16 MED ORDER — MEPERIDINE HCL 100 MG/ML IJ SOLN
INTRAMUSCULAR | Status: AC
  Filled 2012-09-16: qty 1

## 2012-09-16 MED ORDER — MIDAZOLAM HCL 5 MG/5ML IJ SOLN
INTRAMUSCULAR | Status: DC | PRN
  Administered 2012-09-16: 2 mg via INTRAVENOUS
  Administered 2012-09-16 (×2): 1 mg via INTRAVENOUS

## 2012-09-16 MED ORDER — PROMETHAZINE HCL 25 MG/ML IJ SOLN
INTRAMUSCULAR | Status: AC
  Filled 2012-09-16: qty 1

## 2012-09-16 MED ORDER — MIDAZOLAM HCL 5 MG/5ML IJ SOLN
INTRAMUSCULAR | Status: AC
  Filled 2012-09-16: qty 10

## 2012-09-16 MED ORDER — BUTAMBEN-TETRACAINE-BENZOCAINE 2-2-14 % EX AERO
INHALATION_SPRAY | CUTANEOUS | Status: DC | PRN
  Administered 2012-09-16: 1 via TOPICAL

## 2012-09-16 MED ORDER — MEPERIDINE HCL 100 MG/ML IJ SOLN
INTRAMUSCULAR | Status: DC | PRN
  Administered 2012-09-16 (×2): 25 mg via INTRAVENOUS

## 2012-09-16 MED ORDER — SODIUM CHLORIDE 0.9 % IV SOLN
INTRAVENOUS | Status: DC
  Administered 2012-09-16: 12:00:00 via INTRAVENOUS

## 2012-09-16 MED ORDER — POTASSIUM CHLORIDE 10 MEQ/100ML IV SOLN
10.0000 meq | INTRAVENOUS | Status: AC
  Filled 2012-09-16: qty 100
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
## 2012-09-16 NOTE — Progress Notes (Signed)
Subjective: States she has been heaving, laying in bed. Wants EGD. Chest discomfort yesterday with negative troponins, resolved now.   Objective: Vital signs in last 24 hours: Temp:  [97.3 F (36.3 C)-98.4 F (36.9 C)] 98.4 F (36.9 C) (04/23 0454) Pulse Rate:  [78-85] 85 (04/23 0454) Resp:  [18-20] 20 (04/23 0454) BP: (109-113)/(58-65) 109/58 mmHg (04/23 0454) SpO2:  [94 %-96 %] 95 % (04/23 0454) Weight:  [281 lb 15.5 oz (127.9 kg)] 281 lb 15.5 oz (127.9 kg) (04/23 0449) Last BM Date: 09/15/12 General:   Alert and oriented Head:  Normocephalic and atraumatic. Eyes:  No icterus, sclera clear. Conjuctiva pink.  Heart:  S1, S2 present, no murmurs noted.  Lungs: Expiratory wheezing bilaterally Abdomen:  Bowel sounds present, soft, obese, non-tender, non-distended. Difficult to appreciate HSM due to large body habitus.  Extremities:  Left lower extremity with cellulitis Neurologic:  Alert and  oriented x4;  grossly normal neurologically. Skin:  Warm and dry, intact without significant lesions.  Psych:  Alert and cooperative. Normal mood and affect.  Intake/Output from previous day: 04/22 0701 - 04/23 0700 In: 3181.3 [P.O.:1200; I.V.:1031.3; IV Piggyback:950] Out: 4100 [Urine:4100] Intake/Output this shift:    Lab Results:  Recent Labs  09/14/12 1400 09/15/12 0541 09/16/12 0358  WBC 16.3* 9.1 8.8  HGB 9.1* 8.5* 9.2*  HCT 29.0* 27.5* 30.1*  PLT 312 280 318   BMET  Recent Labs  09/14/12 1400 09/15/12 0541 09/16/12 0358  NA 138 141 144  K 3.7 2.9* 3.0*  CL 98 102 104  CO2 28 29 31   GLUCOSE 136* 93 90  BUN 23 21 19   CREATININE 1.61* 1.60* 1.30*  CALCIUM 8.4 7.8* 7.9*   LFT  Recent Labs  09/14/12 1400 09/15/12 1014  PROT 6.4 5.8*  ALBUMIN 2.1* 1.9*  AST 105* 93*  ALT 46* 40*  ALKPHOS 509* 425*  BILITOT 0.9 0.7  BILIDIR  --  0.5*  IBILI  --  0.2*    Studies/Results: Ct Abdomen Pelvis Wo Contrast  09/14/2012  *RADIOLOGY REPORT*  Clinical Data: Left  lower quadrant abdominal pain.  CT ABDOMEN AND PELVIS WITHOUT CONTRAST  Technique:  Multidetector CT imaging of the abdomen and pelvis was performed following the standard protocol without intravenous contrast.  Comparison: CT of the abdomen and pelvis 09/10/2012.  Findings:  Lung Bases: Linear opacities in the lower lobes of the lungs bilaterally, slightly increased compared to the prior examination, favored to represent a combination of atelectasis and/or scarring. Atherosclerotic calcifications within the left anterior descending and left circumflex coronary arteries.  Abdomen/Pelvis:  Severely decreased attenuation throughout the hepatic parenchyma, compatible with hepatic steatosis.  No focal hepatic lesions.  Status post cholecystectomy.  The unenhanced appearance of the pancreas, spleen, bilateral adrenal glands and the right kidney is unremarkable.  There is mild left hydroureteronephrosis which is new compared to the prior examination.  No definite stone is identified along the course of the left ureter, and no definite obstructing retroperitoneal mass is noted.  The urinary bladder is completely decompressed with a Foley balloon catheter in place.  No significant volume of ascites.  No pneumoperitoneum.  No pathologic distension of small bowel.  Numerous colonic diverticula are noted, without surrounding inflammatory changes to suggest acute diverticulitis at this time.  Status post total abdominal hysterectomy.  Ovaries are atrophic.  Musculoskeletal:  There are no aggressive appearing lytic or blastic lesions noted in the visualized portions of the skeleton. Healing fracture in the posterolateral aspect of the left  eleventh rib again noted.  IMPRESSION: 1.  Colonic diverticulosis without evidence to suggest recurrent acute diverticulitis at this time. 2.  Interval development of mild left-sided hydroureteronephrosis. This is of uncertain etiology and significance as there is no definite ureteral stone or  obstructing retroperitoneal mass. Additionally, the urinary bladder is completely decompressed with a Foley balloon catheter in place. 3.  Severe hepatic steatosis. 4.  Status post cholecystectomy and hysterectomy. 5.  Atherosclerosis, including two-vessel coronary artery disease. Assessment for potential risk factor modification, dietary therapy or pharmacologic therapy may be warranted, if clinically indicated.  6.  Additional incidental findings, as above.   Original Report Authenticated By: Trudie Reed, M.D.    Dg Chest Port 1 View  09/14/2012  *RADIOLOGY REPORT*  Clinical Data: Cough, congestion, shortness of breath, history COPD, vomiting, GERD, diabetes, coronary artery disease  PORTABLE CHEST - 1 VIEW  Comparison: Portable exam 1433 hours compared to 08/10/2012  Findings: Borderline enlargement of cardiac silhouette. Left arm PICC line stable tip projecting over SVC. Mediastinal contours and pulmonary vascularity normal for lordotic positioning. Question minimal bibasilar atelectasis. Remaining lungs clear. Underlying emphysematous changes. No pleural effusion or pneumothorax. Prior cervical spine fusion.  IMPRESSION: Question minimal bibasilar atelectasis. Otherwise negative exam.   Original Report Authenticated By: Ulyses Southward, M.D.     Assessment: 67 year old female admitted with intractable nausea, vomiting, elevation of LFTs but improving. Serologies for PBC, autoimmune hepatitis in process. Atypical chest pain yesterday with negative troponins. Discussed with Dr. Juanetta Gosling, who feels she is appropriate for an EGD today. Although she received Lovenox yesterday evening, should not be an issue with diagnostic only EGD.      Plan: Remain NPO EGD with Dr. Darrick Penna today. I discussed the risks and benefits with patient. She states understanding. 20 meq KCL IV now (mild hypokalemia at 3.0) Follow-up on serologies for PBC, autoimmune process Recheck LFTs today (add-on to blood in lab)  Nira Retort, ANP-BC [facility]     LOS: 2 days    09/16/2012, 9:30 AM

## 2012-09-16 NOTE — OR Nursing (Signed)
4 mg of versed was given and 6 mg of versed was wasted all was corrected in the pixis

## 2012-09-16 NOTE — Progress Notes (Signed)
Subjective: She still has some nausea but looks a little better. She had some chest pain yesterday that was pretty atypical. She is negative for infarction by troponin levels. Everything else is about the same. She has no other new complaints.  Objective: Vital signs in last 24 hours: Temp:  [97.3 F (36.3 C)-98.4 F (36.9 C)] 98.4 F (36.9 C) (04/23 0454) Pulse Rate:  [78-85] 85 (04/23 0454) Resp:  [18-20] 20 (04/23 0454) BP: (109-113)/(58-65) 109/58 mmHg (04/23 0454) SpO2:  [94 %-96 %] 95 % (04/23 0454) Weight:  [127.9 kg (281 lb 15.5 oz)] 127.9 kg (281 lb 15.5 oz) (04/23 0449) Weight change: 4.522 kg (9 lb 15.5 oz) Last BM Date: 09/15/12  Intake/Output from previous day: 04/22 0701 - 04/23 0700 In: 3181.3 [P.O.:1200; I.V.:1031.3; IV Piggyback:950] Out: 4100 [Urine:4100]  PHYSICAL EXAM General appearance: alert, cooperative, mild distress and morbidly obese Resp: rhonchi bilaterally Cardio: regular rate and rhythm, S1, S2 normal, no murmur, click, rub or gallop GI: soft, non-tender; bowel sounds normal; no masses,  no organomegaly Extremities: extremities normal, atraumatic, no cyanosis or edema  Lab Results:    Basic Metabolic Panel:  Recent Labs  16/10/96 0541 09/16/12 0358  NA 141 144  K 2.9* 3.0*  CL 102 104  CO2 29 31  GLUCOSE 93 90  BUN 21 19  CREATININE 1.60* 1.30*  CALCIUM 7.8* 7.9*   Liver Function Tests:  Recent Labs  09/14/12 1400 09/15/12 1014  AST 105* 93*  ALT 46* 40*  ALKPHOS 509* 425*  BILITOT 0.9 0.7  PROT 6.4 5.8*  ALBUMIN 2.1* 1.9*   No results found for this basename: LIPASE, AMYLASE,  in the last 72 hours No results found for this basename: AMMONIA,  in the last 72 hours CBC:  Recent Labs  09/14/12 1400 09/15/12 0541 09/16/12 0358  WBC 16.3* 9.1 8.8  NEUTROABS 13.6*  --  5.1  HGB 9.1* 8.5* 9.2*  HCT 29.0* 27.5* 30.1*  MCV 103.9* 103.8* 104.9*  PLT 312 280 318   Cardiac Enzymes:  Recent Labs  09/15/12 1659  09/15/12 2304 09/16/12 0358  TROPONINI <0.30 <0.30 <0.30   BNP: No results found for this basename: PROBNP,  in the last 72 hours D-Dimer: No results found for this basename: DDIMER,  in the last 72 hours CBG:  Recent Labs  09/13/12 2111 09/14/12 0759 09/15/12 1141 09/15/12 1653 09/15/12 2135 09/16/12 0729  GLUCAP 123* 80 124* 174* 107* 90   Hemoglobin A1C: No results found for this basename: HGBA1C,  in the last 72 hours Fasting Lipid Panel: No results found for this basename: CHOL, HDL, LDLCALC, TRIG, CHOLHDL, LDLDIRECT,  in the last 72 hours Thyroid Function Tests: No results found for this basename: TSH, T4TOTAL, FREET4, T3FREE, THYROIDAB,  in the last 72 hours Anemia Panel: No results found for this basename: VITAMINB12, FOLATE, FERRITIN, TIBC, IRON, RETICCTPCT,  in the last 72 hours Coagulation: No results found for this basename: LABPROT, INR,  in the last 72 hours Urine Drug Screen: Drugs of Abuse  No results found for this basename: labopia, cocainscrnur, labbenz, amphetmu, thcu, labbarb    Alcohol Level: No results found for this basename: ETH,  in the last 72 hours Urinalysis:  Recent Labs  09/14/12 1259  COLORURINE YELLOW  LABSPEC 1.020  PHURINE 6.0  GLUCOSEU NEGATIVE  HGBUR MODERATE*  BILIRUBINUR NEGATIVE  KETONESUR NEGATIVE  PROTEINUR TRACE*  UROBILINOGEN 0.2  NITRITE POSITIVE*  LEUKOCYTESUR MODERATE*   Misc. Labs:  ABGS No results found for  this basename: PHART, PCO2, PO2ART, TCO2, HCO3,  in the last 72 hours CULTURES Recent Results (from the past 240 hour(s))  URINE CULTURE     Status: None   Collection Time    09/14/12  1:30 PM      Result Value Range Status   Specimen Description URINE, CLEAN CATCH   Final   Special Requests NONE   Final   Culture  Setup Time 09/15/2012 01:59   Final   Colony Count >=100,000 COLONIES/ML   Final   Culture ENTEROCOCCUS SPECIES   Final   Report Status PENDING   Incomplete  MRSA PCR SCREENING      Status: Abnormal   Collection Time    09/15/12  1:49 AM      Result Value Range Status   MRSA by PCR POSITIVE (*) NEGATIVE Final   Comment:            The GeneXpert MRSA Assay (FDA     approved for NASAL specimens     only), is one component of a     comprehensive MRSA colonization     surveillance program. It is not     intended to diagnose MRSA     infection nor to guide or     monitor treatment for     MRSA infections.     RESULT CALLED TO, READ BACK BY AND VERIFIED WITH:     WEBB C AT 0350 ON [id number] BY FORSYTH K   Studies/Results: Ct Abdomen Pelvis Wo Contrast  09/14/2012  *RADIOLOGY REPORT*  Clinical Data: Left lower quadrant abdominal pain.  CT ABDOMEN AND PELVIS WITHOUT CONTRAST  Technique:  Multidetector CT imaging of the abdomen and pelvis was performed following the standard protocol without intravenous contrast.  Comparison: CT of the abdomen and pelvis 09/10/2012.  Findings:  Lung Bases: Linear opacities in the lower lobes of the lungs bilaterally, slightly increased compared to the prior examination, favored to represent a combination of atelectasis and/or scarring. Atherosclerotic calcifications within the left anterior descending and left circumflex coronary arteries.  Abdomen/Pelvis:  Severely decreased attenuation throughout the hepatic parenchyma, compatible with hepatic steatosis.  No focal hepatic lesions.  Status post cholecystectomy.  The unenhanced appearance of the pancreas, spleen, bilateral adrenal glands and the right kidney is unremarkable.  There is mild left hydroureteronephrosis which is new compared to the prior examination.  No definite stone is identified along the course of the left ureter, and no definite obstructing retroperitoneal mass is noted.  The urinary bladder is completely decompressed with a Foley balloon catheter in place.  No significant volume of ascites.  No pneumoperitoneum.  No pathologic distension of small bowel.  Numerous colonic diverticula  are noted, without surrounding inflammatory changes to suggest acute diverticulitis at this time.  Status post total abdominal hysterectomy.  Ovaries are atrophic.  Musculoskeletal:  There are no aggressive appearing lytic or blastic lesions noted in the visualized portions of the skeleton. Healing fracture in the posterolateral aspect of the left eleventh rib again noted.  IMPRESSION: 1.  Colonic diverticulosis without evidence to suggest recurrent acute diverticulitis at this time. 2.  Interval development of mild left-sided hydroureteronephrosis. This is of uncertain etiology and significance as there is no definite ureteral stone or obstructing retroperitoneal mass. Additionally, the urinary bladder is completely decompressed with a Foley balloon catheter in place. 3.  Severe hepatic steatosis. 4.  Status post cholecystectomy and hysterectomy. 5.  Atherosclerosis, including two-vessel coronary artery disease. Assessment for potential risk factor  modification, dietary therapy or pharmacologic therapy may be warranted, if clinically indicated.  6.  Additional incidental findings, as above.   Original Report Authenticated By: Trudie Reed, M.D.    Dg Chest Port 1 View  09/14/2012  *RADIOLOGY REPORT*  Clinical Data: Cough, congestion, shortness of breath, history COPD, vomiting, GERD, diabetes, coronary artery disease  PORTABLE CHEST - 1 VIEW  Comparison: Portable exam 1433 hours compared to 08/10/2012  Findings: Borderline enlargement of cardiac silhouette. Left arm PICC line stable tip projecting over SVC. Mediastinal contours and pulmonary vascularity normal for lordotic positioning. Question minimal bibasilar atelectasis. Remaining lungs clear. Underlying emphysematous changes. No pleural effusion or pneumothorax. Prior cervical spine fusion.  IMPRESSION: Question minimal bibasilar atelectasis. Otherwise negative exam.   Original Report Authenticated By: Ulyses Southward, M.D.     Medications:  Prior to  Admission:  Prescriptions prior to admission  Medication Sig Dispense Refill  . ALPRAZolam (XANAX) 0.5 MG tablet Take 0.5 mg by mouth 3 (three) times daily as needed for sleep.       Marland Kitchen aspirin 81 MG chewable tablet Chew 81 mg by mouth daily.      Marland Kitchen atorvastatin (LIPITOR) 80 MG tablet Take 80 mg by mouth daily.      . calcitRIOL (ROCALTROL) 0.25 MCG capsule Take 0.25 mcg by mouth daily.      Marland Kitchen diltiazem (CARDIZEM) 120 MG tablet Take 120 mg by mouth daily.       . feeding supplement (PRO-STAT SUGAR FREE 64) LIQD Take 30 mLs by mouth 3 (three) times daily with meals.      . fesoterodine (TOVIAZ) 8 MG TB24 Take 8 mg by mouth daily.      . fluticasone (FLONASE) 50 MCG/ACT nasal spray Place 2 sprays into the nose daily.      Marland Kitchen gabapentin (NEURONTIN) 100 MG capsule Take 100 mg by mouth 3 (three) times daily.      Marland Kitchen HYDROcodone-acetaminophen (NORCO) 10-325 MG per tablet Take 1 tablet by mouth every 6 (six) hours as needed for pain.      Marland Kitchen insulin aspart (NOVOLOG) 100 UNIT/ML injection Inject 3-15 Units into the skin 3 (three) times daily before meals. 121-150=3 units, 151-200=4 units, 201-250=7 units, 251-300=9 units, 301-350=12 units, 351-400=15 units.      . insulin detemir (LEVEMIR) 100 UNIT/ML injection Inject 10 Units into the skin at bedtime.      Marland Kitchen levothyroxine (SYNTHROID, LEVOTHROID) 200 MCG tablet Take 200 mcg by mouth daily before breakfast.      . Nutritional Supplements (ENSURE NUTRITION SHAKE PO) Take by mouth at bedtime.      Marland Kitchen omeprazole (PRILOSEC) 40 MG capsule Take 40 mg by mouth 2 (two) times daily.      . ondansetron (ZOFRAN) 4 MG tablet Take 4 mg by mouth every 8 (eight) hours as needed for nausea.      . polyethylene glycol (MIRALAX / GLYCOLAX) packet Take 17 g by mouth daily.      . predniSONE (DELTASONE) 5 MG tablet Take 5 mg by mouth daily.      . promethazine (PHENERGAN) 12.5 MG tablet Take 12.5 mg by mouth every 6 (six) hours as needed for nausea.      Marland Kitchen torsemide (DEMADEX)  20 MG tablet Take 80 mg by mouth 2 (two) times daily.      . vitamin B-12 (CYANOCOBALAMIN) 1000 MCG tablet Take 1,000 mcg by mouth daily.       Scheduled: . antiseptic oral rinse  15 mL Mouth  Rinse BID  . aspirin  81 mg Oral Daily  . calcitRIOL  0.25 mcg Oral Daily  . cefTRIAXone (ROCEPHIN)  IV  1 g Intravenous Q24H  . Chlorhexidine Gluconate Cloth  6 each Topical Q0600  . ciprofloxacin  200 mg Intravenous Q12H  . diltiazem  120 mg Oral Daily  . enoxaparin (LOVENOX) injection  40 mg Subcutaneous Q24H  . feeding supplement  30 mL Oral TID WC  . fesoterodine  8 mg Oral Daily  . fluticasone  2 spray Each Nare Daily  . gabapentin  100 mg Oral TID  . insulin aspart  0-20 Units Subcutaneous TID WC  . insulin aspart  0-5 Units Subcutaneous QHS  . insulin detemir  10 Units Subcutaneous QHS  . levothyroxine  200 mcg Oral QAC breakfast  . mupirocin ointment  1 application Nasal BID  . pantoprazole  40 mg Oral BID AC  . polyethylene glycol  17 g Oral Daily  . predniSONE  5 mg Oral Daily  . sodium chloride  3 mL Intravenous Q12H  . torsemide  80 mg Oral BID  . vitamin B-12  1,000 mcg Oral Daily   Continuous: . sodium chloride 75 mL/hr at 09/15/12 1615   ZOX:[id number], acetaminophen, albuterol, ALPRAZolam, HYDROcodone-acetaminophen, morphine injection, ondansetron (ZOFRAN) IV, ondansetron  Assesment: She was admitted with intractable nausea and vomiting and multiple other medical problems. She has hydronephrosis and has been seen by the urology is with plans for her to have followup of that. She has been seen by GI and there is some question as to whether she's going to have an EGD but her episode of atypical chest pain interrupted that. She has ruled out for infarction so should be okay now Principal Problem:   Intractable nausea and vomiting Active Problems:   GASTROESOPHAGEAL REFLUX DISEASE   Cellulitis of leg without foot, left   Diabetes mellitus type 2 with complications    Chronic diastolic heart failure   Adrenal insufficiency   Bilateral lower extremity edema   Chronic kidney disease, stage III (moderate)   Atrial fibrillation   Anemia   Hydronephrosis    Plan: Continue treatments. Workup is underway. She seems slightly better    LOS: 2 days   Kaitelyn Jamison L 09/16/2012, 8:54 AM

## 2012-09-16 NOTE — Op Note (Signed)
[facility] [address] [place], [address]   ENDOSCOPY PROCEDURE REPORT  PATIENT: Amma, Crear  MR#: [id number] BIRTHDATE: [birth year] , 67  yrs. old GENDER: Female  ENDOSCOPIST: Jonette Eva, MD REFERRED [id number] Juanetta Gosling, M.D.  PROCEDURE DATE: 09/16/2012 PROCEDURE:   EGD w/ biopsy for H.pylori  INDICATIONS:Nausea.  ELEVATED LIVER ENZYMES. PMHx: DIABETES/COPD , CHRONIC STEROID USE. MEDICATIONS: Demerol 50 mg IV and Versed 4 mg IV TOPICAL ANESTHETIC:   Cetacaine Spray  DESCRIPTION OF PROCEDURE:     Physical exam was performed.  Informed consent was obtained from the patient after explaining the benefits, risks, and alternatives to the procedure.  The patient was connected to the monitor and placed in the left lateral position.  Continuous oxygen was provided by nasal cannula and IV medicine administered through an indwelling cannula.  After administration of sedation, the patients esophagus was intubated and the [id number] ([id number])  endoscope was advanced under direct visualization to the second portion of the duodenum.  The scope was removed slowly by carefully examining the color, texture, anatomy, and integrity of the mucosa on the way out.  The patient was recovered in endoscopy and discharged home in satisfactory condition.    ESOPHAGUS: Mild esophagitis was found at the gastroesophageal junction.  STOMACH: Moderate non-erosive gastritis (inflammation) was found in the gastric body and gastric antrum.  Multiple biopsies were performed using cold forceps.  DUODENUM: The duodenal mucosa showed no abnormalities in the bulb and second portion of the duodenum. COMPLICATIONS:   None  ENDOSCOPIC IMPRESSION: 1.   NAUSEA DUE TO POSTERIOR NASAL DRIP, REFLUX ESOPHAGITIS, AND GASTRITIS. DIFFERENTIAL INCLUDES GASTROPARESIS.  RECOMMENDATIONS: BID PPI AWAIT BIOPSY & SEROLOGIES. HOLD LOVENOX.  BIL SCDs. SOFT MECHNICAL CARB MOD DIET OPV IN 6 WEEKS  WITH DR. Zeidy Tayag. IF NAUSEA PERSISTS, CONSIDER GES.    REPEAT EXAM:   _______________________________ Rosalie DoctorJonette Eva, MD 09/16/2012 3:09 PM       PATIENT NAME:  Nathali, Vent MR#: [id number]

## 2012-09-16 NOTE — Progress Notes (Signed)
REVIEWED.  

## 2012-09-16 NOTE — Progress Notes (Signed)
UR chart review completed.  
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
## 2012-09-17 ENCOUNTER — Encounter (HOSPITAL_COMMUNITY): Payer: Self-pay | Admitting: Gastroenterology

## 2012-09-17 DIAGNOSIS — E876 Hypokalemia: Secondary | ICD-10-CM | POA: Diagnosis not present

## 2012-09-17 DIAGNOSIS — R112 Nausea with vomiting, unspecified: Secondary | ICD-10-CM

## 2012-09-17 DIAGNOSIS — R7989 Other specified abnormal findings of blood chemistry: Secondary | ICD-10-CM

## 2012-09-17 LAB — GLUCOSE, CAPILLARY: Glucose-Capillary: 94 mg/dL (ref 70–99)

## 2012-09-17 LAB — ANA: Anti Nuclear Antibody(ANA): NEGATIVE

## 2012-09-17 NOTE — Progress Notes (Signed)
Subjective: She feels a little bit better. She has no new complaints. She is still nauseated. She did have EGD yesterday which showed esophagitis gastritis and she had a biopsy which is pending. Gastroparesis was not ruled out and clinically she seems to have that  Objective: Vital signs in last 24 hours: Temp:  [98.1 F (36.7 C)-98.4 F (36.9 C)] 98.1 F (36.7 C) (04/24 0502) Pulse Rate:  [89-107] 93 (04/24 0502) Resp:  [17-29] 20 (04/24 0502) BP: (84-133)/(44-89) 105/65 mmHg (04/24 0502) SpO2:  [90 %-98 %] 91 % (04/24 0502) Weight:  [131.9 kg (290 lb 12.6 oz)] 131.9 kg (290 lb 12.6 oz) (04/24 0502) Weight change: 4 kg (8 lb 13.1 oz) Last BM Date: 09/16/12  Intake/Output from previous day: 04/23 0701 - 04/24 0700 In: 760 [P.O.:360; I.V.:400] Out: 2003 [Urine:2000; Stool:3]  PHYSICAL EXAM General appearance: alert, mild distress and morbidly obese Resp: clear to auscultation bilaterally Cardio: regular rate and rhythm, S1, S2 normal, no murmur, click, rub or gallop GI: soft, non-tender; bowel sounds normal; no masses,  no organomegaly Extremities: extremities normal, atraumatic, no cyanosis or edema  Lab Results:    Basic Metabolic Panel:  Recent Labs  16/10/96 0541 09/16/12 0358  NA 141 144  K 2.9* 3.0*  CL 102 104  CO2 29 31  GLUCOSE 93 90  BUN 21 19  CREATININE 1.60* 1.30*  CALCIUM 7.8* 7.9*   Liver Function Tests:  Recent Labs  09/15/12 1014 09/16/12 0358  AST 93* 110*  ALT 40* 45*  ALKPHOS 425* 447*  BILITOT 0.7 0.6  PROT 5.8* 6.0  ALBUMIN 1.9* 1.9*   No results found for this basename: LIPASE, AMYLASE,  in the last 72 hours No results found for this basename: AMMONIA,  in the last 72 hours CBC:  Recent Labs  09/14/12 1400 09/15/12 0541 09/16/12 0358  WBC 16.3* 9.1 8.8  NEUTROABS 13.6*  --  5.1  HGB 9.1* 8.5* 9.2*  HCT 29.0* 27.5* 30.1*  MCV 103.9* 103.8* 104.9*  PLT 312 280 318   Cardiac Enzymes:  Recent Labs  09/15/12 1659  09/15/12 2304 09/16/12 0358  TROPONINI <0.30 <0.30 <0.30   BNP: No results found for this basename: PROBNP,  in the last 72 hours D-Dimer: No results found for this basename: DDIMER,  in the last 72 hours CBG:  Recent Labs  09/16/12 0729 09/16/12 1117 09/16/12 1227 09/16/12 1614 09/16/12 2130 09/17/12 0734  GLUCAP 90 104* 119* 121* 118* 94   Hemoglobin A1C: No results found for this basename: HGBA1C,  in the last 72 hours Fasting Lipid Panel: No results found for this basename: CHOL, HDL, LDLCALC, TRIG, CHOLHDL, LDLDIRECT,  in the last 72 hours Thyroid Function Tests: No results found for this basename: TSH, T4TOTAL, FREET4, T3FREE, THYROIDAB,  in the last 72 hours Anemia Panel:  Recent Labs  09/16/12 0358  FERRITIN 638*   Coagulation: No results found for this basename: LABPROT, INR,  in the last 72 hours Urine Drug Screen: Drugs of Abuse  No results found for this basename: labopia, cocainscrnur, labbenz, amphetmu, thcu, labbarb    Alcohol Level: No results found for this basename: ETH,  in the last 72 hours Urinalysis:  Recent Labs  09/14/12 1259  COLORURINE YELLOW  LABSPEC 1.020  PHURINE 6.0  GLUCOSEU NEGATIVE  HGBUR MODERATE*  BILIRUBINUR NEGATIVE  KETONESUR NEGATIVE  PROTEINUR TRACE*  UROBILINOGEN 0.2  NITRITE POSITIVE*  LEUKOCYTESUR MODERATE*   Misc. Labs:  ABGS No results found for this basename: PHART,  PCO2, PO2ART, TCO2, HCO3,  in the last 72 hours CULTURES Recent Results (from the past 240 hour(s))  URINE CULTURE     Status: None   Collection Time    09/14/12  1:30 PM      Result Value Range Status   Specimen Description URINE, CLEAN CATCH   Final   Special Requests NONE   Final   Culture  Setup Time 09/15/2012 01:59   Final   Colony Count >=100,000 COLONIES/ML   Final   Culture ENTEROCOCCUS SPECIES   Final   Report Status PENDING   Incomplete  MRSA PCR SCREENING     Status: Abnormal   Collection Time    09/15/12  1:49 AM       Result Value Range Status   MRSA by PCR POSITIVE (*) NEGATIVE Final   Comment:            The GeneXpert MRSA Assay (FDA     approved for NASAL specimens     only), is one component of a     comprehensive MRSA colonization     surveillance program. It is not     intended to diagnose MRSA     infection nor to guide or     monitor treatment for     MRSA infections.     RESULT CALLED TO, READ BACK BY AND VERIFIED WITH:     WEBB C AT 0350 ON [id number] BY FORSYTH K   Studies/Results: No results found.  Medications:  Prior to Admission:  Prescriptions prior to admission  Medication Sig Dispense Refill  . ALPRAZolam (XANAX) 0.5 MG tablet Take 0.5 mg by mouth 3 (three) times daily as needed for sleep.       Marland Kitchen aspirin 81 MG chewable tablet Chew 81 mg by mouth daily.      Marland Kitchen atorvastatin (LIPITOR) 80 MG tablet Take 80 mg by mouth daily.      . calcitRIOL (ROCALTROL) 0.25 MCG capsule Take 0.25 mcg by mouth daily.      Marland Kitchen diltiazem (CARDIZEM) 120 MG tablet Take 120 mg by mouth daily.       . feeding supplement (PRO-STAT SUGAR FREE 64) LIQD Take 30 mLs by mouth 3 (three) times daily with meals.      . fesoterodine (TOVIAZ) 8 MG TB24 Take 8 mg by mouth daily.      . fluticasone (FLONASE) 50 MCG/ACT nasal spray Place 2 sprays into the nose daily.      Marland Kitchen gabapentin (NEURONTIN) 100 MG capsule Take 100 mg by mouth 3 (three) times daily.      Marland Kitchen HYDROcodone-acetaminophen (NORCO) 10-325 MG per tablet Take 1 tablet by mouth every 6 (six) hours as needed for pain.      Marland Kitchen insulin aspart (NOVOLOG) 100 UNIT/ML injection Inject 3-15 Units into the skin 3 (three) times daily before meals. 121-150=3 units, 151-200=4 units, 201-250=7 units, 251-300=9 units, 301-350=12 units, 351-400=15 units.      . insulin detemir (LEVEMIR) 100 UNIT/ML injection Inject 10 Units into the skin at bedtime.      Marland Kitchen levothyroxine (SYNTHROID, LEVOTHROID) 200 MCG tablet Take 200 mcg by mouth daily before breakfast.      . Nutritional  Supplements (ENSURE NUTRITION SHAKE PO) Take by mouth at bedtime.      Marland Kitchen omeprazole (PRILOSEC) 40 MG capsule Take 40 mg by mouth 2 (two) times daily.      . ondansetron (ZOFRAN) 4 MG tablet Take 4 mg by mouth every 8 (eight)  hours as needed for nausea.      . polyethylene glycol (MIRALAX / GLYCOLAX) packet Take 17 g by mouth daily.      . predniSONE (DELTASONE) 5 MG tablet Take 5 mg by mouth daily.      . promethazine (PHENERGAN) 12.5 MG tablet Take 12.5 mg by mouth every 6 (six) hours as needed for nausea.      Marland Kitchen torsemide (DEMADEX) 20 MG tablet Take 80 mg by mouth 2 (two) times daily.      . vitamin B-12 (CYANOCOBALAMIN) 1000 MCG tablet Take 1,000 mcg by mouth daily.       Scheduled: . antiseptic oral rinse  15 mL Mouth Rinse BID  . aspirin  81 mg Oral Daily  . calcitRIOL  0.25 mcg Oral Daily  . cefTRIAXone (ROCEPHIN)  IV  1 g Intravenous Q24H  . Chlorhexidine Gluconate Cloth  6 each Topical Q0600  . ciprofloxacin  200 mg Intravenous Q12H  . diltiazem  120 mg Oral Daily  . feeding supplement  30 mL Oral TID WC  . fesoterodine  8 mg Oral Daily  . fluticasone  2 spray Each Nare Daily  . gabapentin  100 mg Oral TID  . insulin aspart  0-20 Units Subcutaneous TID WC  . insulin aspart  0-5 Units Subcutaneous QHS  . insulin detemir  10 Units Subcutaneous QHS  . levothyroxine  200 mcg Oral QAC breakfast  . mupirocin ointment  1 application Nasal BID  . pantoprazole  40 mg Oral BID AC  . polyethylene glycol  17 g Oral Daily  . predniSONE  5 mg Oral Daily  . sodium chloride  3 mL Intravenous Q12H  . torsemide  80 mg Oral BID  . vitamin B-12  1,000 mcg Oral Daily   Continuous: . sodium chloride 75 mL/hr at 09/17/12 0247   ZOX:[id number], acetaminophen, albuterol, ALPRAZolam, HYDROcodone-acetaminophen, morphine injection, ondansetron (ZOFRAN) IV, ondansetron  Assesment: She has intractable nausea and vomiting. She is somewhat better. She had EGD yesterday. I think she may need gastric  emptying study. She has multiple other medical problems as noted Principal Problem:   Intractable nausea and vomiting Active Problems:   GASTROESOPHAGEAL REFLUX DISEASE   Cellulitis of leg without foot, left   Diabetes mellitus type 2 with complications   Chronic diastolic heart failure   Adrenal insufficiency   Bilateral lower extremity edema   Chronic kidney disease, stage III (moderate)   Atrial fibrillation   Anemia   Hydronephrosis    Plan: Continue current treatments and have her get gastric emptying study    LOS: 3 days   Sterling Ucci L 09/17/2012, 8:56 AM

## 2012-09-17 NOTE — Clinical Social Work Note (Signed)
CSW spoke with Tammi at [facility] who reports pt's husband notified facility that he plans to take pt home at d/c. CSW confirmed this with pt and pt's husband. He has arranged to have assistance in the home for 10 hours a day and feels he can manage the rest. CM and MD notified. CSW signing off but can be reconsulted if needed.  Derenda Fennel, [place] [phone]

## 2012-09-17 NOTE — Progress Notes (Signed)
Subjective:  Still c/o nausea. Last episode of vomiting yesterday. BM yesterday. Going to try and eat eggs with vinegar.  Objective: Vital signs in last 24 hours: Temp:  [98.1 F (36.7 C)-98.4 F (36.9 C)] 98.1 F (36.7 C) (04/24 0502) Pulse Rate:  [89-107] 93 (04/24 0502) Resp:  [17-29] 20 (04/24 0502) BP: (84-133)/(44-89) 105/65 mmHg (04/24 0502) SpO2:  [90 %-98 %] 91 % (04/24 0502) Weight:  [290 lb 12.6 oz (131.9 kg)] 290 lb 12.6 oz (131.9 kg) (04/24 0502) Last BM Date: 09/16/12 General:   Alert,  Well-developed, well-nourished, pleasant and cooperative in NAD. Husband at bedside. Head:  Normocephalic and atraumatic. Eyes:  Sclera clear, no icterus.  Abdomen:  Soft, nontender and nondistended. No masses, hepatosplenomegaly or hernias noted. Normal bowel sounds, without guarding, and without rebound.  Exam limited due to body habitus. Extremities:  Without clubbing, deformity. 2+pitting edema. Neurologic:  Alert and  oriented x4;  grossly normal neurologically. Skin:  Intact without significant lesions or rashes. Psych:  Alert and cooperative. Normal mood and affect.  Intake/Output from previous day: 04/23 0701 - 04/24 0700 In: 760 [P.O.:360; I.V.:400] Out: 2003 [Urine:2000; Stool:3] Intake/Output this shift:    Lab Results: CBC  Recent Labs  09/14/12 1400 09/15/12 0541 09/16/12 0358  WBC 16.3* 9.1 8.8  HGB 9.1* 8.5* 9.2*  HCT 29.0* 27.5* 30.1*  MCV 103.9* 103.8* 104.9*  PLT 312 280 318   BMET  Recent Labs  09/14/12 1400 09/15/12 0541 09/16/12 0358  NA 138 141 144  K 3.7 2.9* 3.0*  CL 98 102 104  CO2 28 29 31   GLUCOSE 136* 93 90  BUN 23 21 19   CREATININE 1.61* 1.60* 1.30*  CALCIUM 8.4 7.8* 7.9*   Lab Results  Component Value Date   VITAMINB12 206* 06/13/2012   Lab Results  Component Value Date   FOLATE 6.5 06/13/2012    LFTs  Recent Labs  09/14/12 1400 09/15/12 1014 09/16/12 0358  BILITOT 0.9 0.7 0.6  BILIDIR  --  0.5* 0.4*  IBILI  --   0.2* 0.2*  ALKPHOS 509* 425* 447*  AST 105* 93* 110*  ALT 46* 40* 45*  PROT 6.4 5.8* 6.0  ALBUMIN 2.1* 1.9* 1.9*   Lab Results  Component Value Date   HEPBCAB Negative 09/07/2012  HCV AB: negative  Lab Results  Component Value Date   FERRITIN 638* 09/16/2012   Mitochondrial M2 Ab, IgG: 1.10 (abnormal)  Imaging Studies: Ct Abdomen Pelvis Wo Contrast  09/14/2012  *RADIOLOGY REPORT*  Clinical Data: Left lower quadrant abdominal pain.  CT ABDOMEN AND PELVIS WITHOUT CONTRAST  Technique:  Multidetector CT imaging of the abdomen and pelvis was performed following the standard protocol without intravenous contrast.  Comparison: CT of the abdomen and pelvis 09/10/2012.  Findings:  Lung Bases: Linear opacities in the lower lobes of the lungs bilaterally, slightly increased compared to the prior examination, favored to represent a combination of atelectasis and/or scarring. Atherosclerotic calcifications within the left anterior descending and left circumflex coronary arteries.  Abdomen/Pelvis:  Severely decreased attenuation throughout the hepatic parenchyma, compatible with hepatic steatosis.  No focal hepatic lesions.  Status post cholecystectomy.  The unenhanced appearance of the pancreas, spleen, bilateral adrenal glands and the right kidney is unremarkable.  There is mild left hydroureteronephrosis which is new compared to the prior examination.  No definite stone is identified along the course of the left ureter, and no definite obstructing retroperitoneal mass is noted.  The urinary bladder is completely decompressed with  a Foley balloon catheter in place.  No significant volume of ascites.  No pneumoperitoneum.  No pathologic distension of small bowel.  Numerous colonic diverticula are noted, without surrounding inflammatory changes to suggest acute diverticulitis at this time.  Status post total abdominal hysterectomy.  Ovaries are atrophic.  Musculoskeletal:  There are no aggressive appearing lytic  or blastic lesions noted in the visualized portions of the skeleton. Healing fracture in the posterolateral aspect of the left eleventh rib again noted.  IMPRESSION: 1.  Colonic diverticulosis without evidence to suggest recurrent acute diverticulitis at this time. 2.  Interval development of mild left-sided hydroureteronephrosis. This is of uncertain etiology and significance as there is no definite ureteral stone or obstructing retroperitoneal mass. Additionally, the urinary bladder is completely decompressed with a Foley balloon catheter in place. 3.  Severe hepatic steatosis. 4.  Status post cholecystectomy and hysterectomy. 5.  Atherosclerosis, including two-vessel coronary artery disease. Assessment for potential risk factor modification, dietary therapy or pharmacologic therapy may be warranted, if clinically indicated.  6.  Additional incidental findings, as above.   Original Report Authenticated By: Trudie Reed, M.D.    US Abdomen Complete  08/28/2012  *RADIOLOGY REPORT*  Clinical Data:  Elevated liver function tests.  ABDOMINAL ULTRASOUND COMPLETE  Comparison:  CT on 08/08/2012  Findings:  Gallbladder:  Surgically absent.  Common Bile Duct:  Within normal limits in caliber. Measures 7 mm in diameter.  Liver: Diffusely increased parenchymal echogenicity, consistent with hepatic steatosis.  No focal liver mass identified.  IVC:  Appears normal.  Pancreas:  No abnormality identified.  Spleen:  Within normal limits in size and echotexture.  Right kidney:  Normal in size and parenchymal echogenicity.  No evidence of mass or hydronephrosis.  Left kidney:  Suboptimal visualization due to large habitus and patient positioning.Normal in size and parenchymal echogenicity. No evidence of mass or hydronephrosis.  Abdominal Aorta:  No aneurysm identified.  IMPRESSION:  1.  Prior cholecystectomy.  No evidence of biliary ductal dilatation. 2.  Hepatic steatosis.   Original Report Authenticated By: Myles Rosenthal, M.D.     Ct Abdomen Pelvis W Contrast  09/10/2012  *RADIOLOGY REPORT*  Clinical Data: Right upper quadrant pain.  Nausea and vomiting. Elevated liver function tests.  CT ABDOMEN AND PELVIS WITH CONTRAST  Technique:  Multidetector CT imaging of the abdomen and pelvis was performed following the standard protocol during bolus administration of intravenous contrast.  Contrast: 80mL OMNIPAQUE IOHEXOL 300 MG/ML  SOLN  Comparison: 08/08/2012  Findings: Increased severe hepatic steatosis is seen since recent exam, consistent with acute steatohepatitis.  There is a focal fatty alteration again seen in the left hepatic lobe, but no liver masses are identified. No gross morphologic signs of hepatic cirrhosis identified.  Prior cholecystectomy noted, however there is no evidence of biliary ductal dilatation.  The pancreas, spleen, adrenal glands, and kidneys are normal in appearance.  No evidence of hydronephrosis.  No soft tissue masses or lymphadenopathy seen elsewhere within the abdomen or pelvis.  A Foley catheter is seen within the bladder which is empty. Diverticulosis is again seen involving the sigmoid colon, and there is been near complete resolution of the sigmoid diverticulitis since prior exam.  No other inflammatory process or abscess identified.  No evidence of free fluid or dilated bowel loops. Hand injection granulomas again seen in the subcutaneous fat of the anterior abdominal wall.  IMPRESSION:  1.  Increased severe hepatic steatosis, consistent with acute steatohepatitis. 2.  Near complete resolution of sigmoid  diverticulitis since previous exam.  No evidence of abscess or other complication.   Original Report Authenticated By: Myles Rosenthal, M.D.      Assessment: 67 y/o female admitted with intractable N/V, elevation of LFTs but improving. Serologies for autoimmune hepatitis in process. She is on low dose prednisone (?indication). She has h/o adrenal insufficiency. EGD yesterday showed mild esophagitis,  moderate non-erosive inflammation s/p bx. Nausea persists. Unclear if abnormal LFTs related to this. ?underlying gastroparesis more active with recent illness. She had positive mitochondrial antibody. Will likely need liver biopsy at later date (transjugular).  Low B12 level 05/2012.   Plan: 1. PPI BID. 2. F/U biopsies. 3. OV in 6 weeks with Dr. Darrick Penna. In nauseas persists, consider GES. More optimal to do outside of acute illness. 4. Consider transjugular liver biopsy to evaluate for PBC +/- autoimmune overlap at later date. 5. Continue to monitor LFTs. 6. Consider further management of low B12 level per attending.  7.    LOS: 3 days   Tana Coast  09/17/2012, 8:33 AM  Patient seen this evening at 1745. States nausea somewhat better. Gastric  emptying study scheduled for tomorrow. Further evaluation of elevated alkaline phosphatase and aminotransferases needed. Positive AMA. On low dose steroids for reported adrenal insufficiency. Would have a lower threshold for proceeding with a liver biopsy in the near future to help sort out the etiology of her elevated LFTs, particularly to rule out a PBC/autoimmune overlap syndrome.  Further recommendations to follow.

## 2012-09-18 ENCOUNTER — Inpatient Hospital Stay (HOSPITAL_COMMUNITY): Payer: Medicare Other

## 2012-09-18 DIAGNOSIS — E876 Hypokalemia: Secondary | ICD-10-CM | POA: Diagnosis not present

## 2012-09-18 DIAGNOSIS — R7989 Other specified abnormal findings of blood chemistry: Secondary | ICD-10-CM | POA: Diagnosis not present

## 2012-09-18 DIAGNOSIS — R112 Nausea with vomiting, unspecified: Secondary | ICD-10-CM | POA: Diagnosis not present

## 2012-09-18 LAB — URINE CULTURE: Colony Count: >=100000

## 2012-09-18 LAB — GLUCOSE, CAPILLARY
Glucose-Capillary: 123 mg/dL — ABNORMAL HIGH (ref 70–99)
Glucose-Capillary: 133 mg/dL — ABNORMAL HIGH (ref 70–99)

## 2012-09-18 LAB — HEPATIC FUNCTION PANEL
AST: 72 U/L — ABNORMAL HIGH (ref 0–37)
Albumin: 1.7 g/dL — ABNORMAL LOW (ref 3.5–5.2)
Alkaline Phosphatase: 356 U/L — ABNORMAL HIGH (ref 39–117)
Total Bilirubin: 0.5 mg/dL (ref 0.3–1.2)
Total Protein: 5.4 g/dL — ABNORMAL LOW (ref 6.0–8.3)

## 2012-09-18 LAB — BASIC METABOLIC PANEL
CO2: 32 mEq/L (ref 19–32)
Calcium: 8.1 mg/dL — ABNORMAL LOW (ref 8.4–10.5)
Chloride: 102 mEq/L (ref 96–112)
Creatinine, Ser: 0.96 mg/dL (ref 0.50–1.10)
Glucose, Bld: 87 mg/dL (ref 70–99)

## 2012-09-18 LAB — ALPHA-1-ANTITRYPSIN: A-1 Antitrypsin, Ser: 203 mg/dL — ABNORMAL HIGH (ref 90–200)

## 2012-09-18 LAB — ANTI-SMOOTH MUSCLE ANTIBODY, IGG: F-Actin IgG: 16 U (ref <20)

## 2012-09-18 LAB — IGG, IGA, IGM: IgM, Serum: 126 mg/dL (ref 52–322)

## 2012-09-18 MED ORDER — POTASSIUM CHLORIDE 10 MEQ/100ML IV SOLN
10.0000 meq | INTRAVENOUS | Status: AC
  Administered 2012-09-18 (×6): 10 meq via INTRAVENOUS
  Filled 2012-09-18: qty 600

## 2012-09-18 MED ORDER — POTASSIUM CHLORIDE 10 MEQ/100ML IV SOLN
10.0000 meq | INTRAVENOUS | Status: AC
  Administered 2012-09-18 (×3): 10 meq via INTRAVENOUS
  Filled 2012-09-18 (×3): qty 100

## 2012-09-18 MED ORDER — BUTALBITAL-APAP-CAFFEINE 50-325-40 MG PO TABS
1.0000 | ORAL_TABLET | ORAL | Status: DC | PRN
  Administered 2012-09-18 (×2): 1 via ORAL
  Filled 2012-09-18 (×2): qty 1

## 2012-09-18 MED ORDER — METOCLOPRAMIDE HCL 5 MG/ML IJ SOLN
5.0000 mg | Freq: Four times a day (QID) | INTRAMUSCULAR | Status: DC
  Administered 2012-09-18 – 2012-09-19 (×3): 5 mg via INTRAVENOUS
  Filled 2012-09-18 (×3): qty 2

## 2012-09-18 NOTE — Progress Notes (Signed)
Subjective:  Still with N/V. GES not done as patient vomited the egg.   Objective: Vital signs in last 24 hours: Temp:  [97.3 F (36.3 C)-98.1 F (36.7 C)] 97.3 F (36.3 C) (04/25 0523) Pulse Rate:  [76-83] 80 (04/25 0523) Resp:  [20-21] 21 (04/25 0523) BP: (95-114)/(40-50) 114/50 mmHg (04/25 0523) SpO2:  [96 %-99 %] 99 % (04/25 0523) Weight:  [285 lb 0.9 oz (129.3 kg)] 285 lb 0.9 oz (129.3 kg) (04/25 0523) Last BM Date: 09/16/12 General:   Alert,  Well-developed, well-nourished, pleasant and cooperative in NAD. Spouse at bedside. Head:  Normocephalic and atraumatic. Eyes:  Sclera clear, no icterus.   Abdomen:  Soft, nontender and nondistended.  Normal bowel sounds, without guarding, and without rebound.   Extremities:  2+ PE, Without clubbing, deformity. Neurologic:  Alert and  oriented x4;  grossly normal neurologically. Skin:  Intact without significant lesions or rashes. Psych:  Alert and cooperative. Normal mood and affect.  Intake/Output from previous day:   Intake/Output this shift: Total I/O In: -  Out: 275 [Urine:275]  Lab Results: CBC  Recent Labs  09/16/12 0358  WBC 8.8  HGB 9.2*  HCT 30.1*  MCV 104.9*  PLT 318   BMET  Recent Labs  09/16/12 0358 09/18/12 0510  NA 144 142  K 3.0* 2.6*  CL 104 102  CO2 31 32  GLUCOSE 90 87  BUN 19 11  CREATININE 1.30* 0.96  CALCIUM 7.9* 8.1*   LFTs  Recent Labs  09/16/12 0358 09/18/12 0500  BILITOT 0.6 0.5  BILIDIR 0.4* 0.3  IBILI 0.2* 0.2*  ALKPHOS 447* 356*  AST 110* 72*  ALT 45* 34  PROT 6.0 5.4*  ALBUMIN 1.9* 1.7*   No results found for this basename: LIPASE,  in the last 72 hours PT/INR No results found for this basename: LABPROT, INR,  in the last 72 hours    Imaging Studies: Ct Abdomen Pelvis Wo Contrast  09/14/2012  *RADIOLOGY REPORT*  Clinical Data: Left lower quadrant abdominal pain.  CT ABDOMEN AND PELVIS WITHOUT CONTRAST  Technique:  Multidetector CT imaging of the abdomen and pelvis  was performed following the standard protocol without intravenous contrast.  Comparison: CT of the abdomen and pelvis 09/10/2012.  Findings:  Lung Bases: Linear opacities in the lower lobes of the lungs bilaterally, slightly increased compared to the prior examination, favored to represent a combination of atelectasis and/or scarring. Atherosclerotic calcifications within the left anterior descending and left circumflex coronary arteries.  Abdomen/Pelvis:  Severely decreased attenuation throughout the hepatic parenchyma, compatible with hepatic steatosis.  No focal hepatic lesions.  Status post cholecystectomy.  The unenhanced appearance of the pancreas, spleen, bilateral adrenal glands and the right kidney is unremarkable.  There is mild left hydroureteronephrosis which is new compared to the prior examination.  No definite stone is identified along the course of the left ureter, and no definite obstructing retroperitoneal mass is noted.  The urinary bladder is completely decompressed with a Foley balloon catheter in place.  No significant volume of ascites.  No pneumoperitoneum.  No pathologic distension of small bowel.  Numerous colonic diverticula are noted, without surrounding inflammatory changes to suggest acute diverticulitis at this time.  Status post total abdominal hysterectomy.  Ovaries are atrophic.  Musculoskeletal:  There are no aggressive appearing lytic or blastic lesions noted in the visualized portions of the skeleton. Healing fracture in the posterolateral aspect of the left eleventh rib again noted.  IMPRESSION: 1.  Colonic diverticulosis without evidence  to suggest recurrent acute diverticulitis at this time. 2.  Interval development of mild left-sided hydroureteronephrosis. This is of uncertain etiology and significance as there is no definite ureteral stone or obstructing retroperitoneal mass. Additionally, the urinary bladder is completely decompressed with a Foley balloon catheter in place.  3.  Severe hepatic steatosis. 4.  Status post cholecystectomy and hysterectomy. 5.  Atherosclerosis, including two-vessel coronary artery disease. Assessment for potential risk factor modification, dietary therapy or pharmacologic therapy may be warranted, if clinically indicated.  6.  Additional incidental findings, as above.   Original Report Authenticated By: Trudie Reed, M.D.    US Abdomen Complete  08/28/2012  *RADIOLOGY REPORT*  Clinical Data:  Elevated liver function tests.  ABDOMINAL ULTRASOUND COMPLETE  Comparison:  CT on 08/08/2012  Findings:  Gallbladder:  Surgically absent.  Common Bile Duct:  Within normal limits in caliber. Measures 7 mm in diameter.  Liver: Diffusely increased parenchymal echogenicity, consistent with hepatic steatosis.  No focal liver mass identified.  IVC:  Appears normal.  Pancreas:  No abnormality identified.  Spleen:  Within normal limits in size and echotexture.  Right kidney:  Normal in size and parenchymal echogenicity.  No evidence of mass or hydronephrosis.  Left kidney:  Suboptimal visualization due to large habitus and patient positioning.Normal in size and parenchymal echogenicity. No evidence of mass or hydronephrosis.  Abdominal Aorta:  No aneurysm identified.  IMPRESSION:  1.  Prior cholecystectomy.  No evidence of biliary ductal dilatation. 2.  Hepatic steatosis.   Original Report Authenticated By: Myles Rosenthal, M.D.    Ct Abdomen Pelvis W Contrast  09/10/2012  *RADIOLOGY REPORT*  Clinical Data: Right upper quadrant pain.  Nausea and vomiting. Elevated liver function tests.  CT ABDOMEN AND PELVIS WITH CONTRAST  Technique:  Multidetector CT imaging of the abdomen and pelvis was performed following the standard protocol during bolus administration of intravenous contrast.  Contrast: 80mL OMNIPAQUE IOHEXOL 300 MG/ML  SOLN  Comparison: 08/08/2012  Findings: Increased severe hepatic steatosis is seen since recent exam, consistent with acute steatohepatitis.  There  is a focal fatty alteration again seen in the left hepatic lobe, but no liver masses are identified. No gross morphologic signs of hepatic cirrhosis identified.  Prior cholecystectomy noted, however there is no evidence of biliary ductal dilatation.  The pancreas, spleen, adrenal glands, and kidneys are normal in appearance.  No evidence of hydronephrosis.  No soft tissue masses or lymphadenopathy seen elsewhere within the abdomen or pelvis.  A Foley catheter is seen within the bladder which is empty. Diverticulosis is again seen involving the sigmoid colon, and there is been near complete resolution of the sigmoid diverticulitis since prior exam.  No other inflammatory process or abscess identified.  No evidence of free fluid or dilated bowel loops. Hand injection granulomas again seen in the subcutaneous fat of the anterior abdominal wall.  IMPRESSION:  1.  Increased severe hepatic steatosis, consistent with acute steatohepatitis. 2.  Near complete resolution of sigmoid diverticulitis since previous exam.  No evidence of abscess or other complication.   Original Report Authenticated By: Myles Rosenthal, M.D.    Dg Chest Port 1 View  09/14/2012  *RADIOLOGY REPORT*  Clinical Data: Cough, congestion, shortness of breath, history COPD, vomiting, GERD, diabetes, coronary artery disease  PORTABLE CHEST - 1 VIEW  Comparison: Portable exam 1433 hours compared to 08/10/2012  Findings: Borderline enlargement of cardiac silhouette. Left arm PICC line stable tip projecting over SVC. Mediastinal contours and pulmonary vascularity normal for lordotic positioning.  Question minimal bibasilar atelectasis. Remaining lungs clear. Underlying emphysematous changes. No pleural effusion or pneumothorax. Prior cervical spine fusion.  IMPRESSION: Question minimal bibasilar atelectasis. Otherwise negative exam.   Original Report Authenticated By: Ulyses Southward, M.D.   [2 weeks]   Assessment: 67 y/o female admitted with intractable N/V,  elevation of LFTs but improving. Serologies for autoimmune hepatitis in process. She is on low dose prednisone (for adrenal insufficiency per pt). EGD showed mild esophagitis, moderate non-erosive inflammation s/p bx (reactive gastropathy without H.Pylori). Nausea/vomiting persists. Unclear if abnormal LFTs related to this. ?underlying gastroparesis more active with recent illness. ?N/V related to inadequate control of adrenal insufficiency? She had positive mitochondrial antibody. Will likely need liver biopsy at later date (transjugular).   B12 level low 05/2012.  Plan: 1. Trial of Reglan 5mg  IV q 6hours X 8 doses.  2. F/u serologies as available and then consider transjugular liver biopsy to evaluate for PBC +/- autoimmune overlap. 3. Further management of low B12 level per attending.  4. ?inadequate management of adrenal insufficiency as cause of her N/V. Leave to attending physician's discretion.    LOS: 4 days   Tana Coast  09/18/2012, 3:07 PM  Patient seen and examined this afternoon. "Unmasked" gastroparesis in a setting of acute illness likely in this setting. Elevated liver enzymes likely a second process as outlined above.  Agree with a trial of Reglan to see if we can get her nausea under control. Discussed the risk and benefits with the patient and husband at the bedside. All parties are agreeable. We will reassess over the weekend.

## 2012-09-18 NOTE — Evaluation (Signed)
Physical Therapy Evaluation Patient Details Name: Ann [name] MRN: [id number] DOB: 01/25/1946 Today's Date: 09/18/2012 Time: 4098-1191 PT Time Calculation (min): 41 min  PT Assessment / Plan / Recommendation Clinical Impression  Pt is a 67 yo with family support at home.  She states she has spent the past month at [facility] and desires to return home and not to SNF at discharge.  Pt will benefit from skilled PT to improve functional mobility and tolerance.    PT Assessment  Patient needs continued PT services    Follow Up Recommendations  Home health PT    Does the patient have the potential to tolerate intense rehabilitation    no  Barriers to Discharge  none      Equipment Recommendations  None recommended by PT    Recommendations for Other Services   none  Frequency Min 3X/week    Precautions / Restrictions Precautions Precautions: Fall Restrictions Weight Bearing Restrictions: No         Mobility  Bed Mobility Bed Mobility: Supine to Sit Supine to Sit: With rails;6: Modified independent (Device/Increase time) Sitting - Scoot to Edge of Bed: 6: Modified independent (Device/Increase time) Transfers Transfers: Sit to Stand Sit to Stand: 6: Modified independent (Device/Increase time) (mod I from bed; needed min-mod assist from commode) Stand to Sit: 5: Supervision Ambulation/Gait Ambulation/Gait Assistance: 4: Min guard Ambulation Distance (Feet): 10 Feet (x2) Assistive device: Rolling walker Ambulation/Gait Assistance Details: SOB and weak pt states she can not go any further. Gait Pattern: Within Functional Limits Gait velocity: slow and cautious    Exercises     PT Diagnosis: Generalized weakness;Difficulty walking  PT Problem List: Decreased strength;Decreased activity tolerance;Obesity;Cardiopulmonary status limiting activity PT Treatment Interventions: Functional mobility training;Therapeutic activities;Therapeutic exercise   PT Goals Acute Rehab PT  Goals PT Goal Formulation: With patient Time For Goal Achievement: 09/23/12 Potential to Achieve Goals: Good Pt will go Supine/Side to Sit: with supervision PT Goal: Supine/Side to Sit - Progress: Goal set today Pt will go Sit to Stand: with modified independence PT Goal: Sit to Stand - Progress: Goal set today Pt will Transfer Bed to Chair/Chair to Bed: with modified independence PT Transfer Goal: Bed to Chair/Chair to Bed - Progress: Goal set today Pt will Ambulate: 16 - 50 feet PT Goal: Ambulate - Progress: Goal set today  Visit Information  Last PT Received On: 09/18/12 Reason Eval/Treat Not Completed: Medical issues which prohibited therapy    Subjective Data  Subjective: Pt states she does not want to go to SNF she plans on going home.    Patient Stated Goal: To go home   Prior Functioning  Home Living Lives With: Spouse;Family;Son Available Help at Discharge: Family Type of Home: House Home Access: Ramped entrance Home Layout: One level Home Adaptive Equipment: Walker - rolling;Wheelchair - manual;Bedside commode/3-in-1;Hospital bed Prior Function Level of Independence: Needs assistance Needs Assistance: Bathing;Dressing;Grooming;Toileting;Meal Prep;Light Housekeeping Bath: Maximal Dressing: Maximal Grooming: Moderate Toileting: Moderate Meal Prep: Total Light Housekeeping: Total Able to Take Stairs?: No Driving: No Vocation: Retired Musician: No difficulties    Copywriter, advertising Arousal/Alertness: Awake/alert    Extremity/Trunk Assessment Right Lower Extremity Assessment RLE ROM/Strength/Tone: WFL for tasks assessed Left Lower Extremity Assessment LLE ROM/Strength/Tone: WFL for tasks assessed   Balance    End of Session PT - End of Session Equipment Utilized During Treatment: Gait belt Activity Tolerance: Patient limited by fatigue Patient left: in chair;with family/visitor present;with call bell/phone within reach Nurse  Communication: Mobility status  GP     RUSSELL,CINDY 09/18/2012, 5:41 PM

## 2012-09-18 NOTE — Progress Notes (Signed)
Subjective: She is awake and alert. She has less problem with her stomach. She is set for gastric emptying study today. She's complaining of headache.  Objective: Vital signs in last 24 hours: Temp:  [97.3 F (36.3 C)-98.1 F (36.7 C)] 97.3 F (36.3 C) (04/25 0523) Pulse Rate:  [76-94] 80 (04/25 0523) Resp:  [18-21] 21 (04/25 0523) BP: (95-114)/(40-60) 114/50 mmHg (04/25 0523) SpO2:  [92 %-99 %] 99 % (04/25 0523) Weight:  [129.3 kg (285 lb 0.9 oz)] 129.3 kg (285 lb 0.9 oz) (04/25 0523) Weight change: -2.6 kg (-5 lb 11.7 oz) Last BM Date: 09/16/12  Intake/Output from previous day:    PHYSICAL EXAM General appearance: alert, cooperative, mild distress and morbidly obese Resp: clear to auscultation bilaterally Cardio: regular rate and rhythm, S1, S2 normal, no murmur, click, rub or gallop GI: Minimally tender diffusely Extremities: extremities normal, atraumatic, no cyanosis or edema  Lab Results:    Basic Metabolic Panel:  Recent Labs  16/10/96 0358 09/18/12 0510  NA 144 142  K 3.0* 2.6*  CL 104 102  CO2 31 32  GLUCOSE 90 87  BUN 19 11  CREATININE 1.30* 0.96  CALCIUM 7.9* 8.1*   Liver Function Tests:  Recent Labs  09/16/12 0358 09/18/12 0500  AST 110* 72*  ALT 45* 34  ALKPHOS 447* 356*  BILITOT 0.6 0.5  PROT 6.0 5.4*  ALBUMIN 1.9* 1.7*   No results found for this basename: LIPASE, AMYLASE,  in the last 72 hours No results found for this basename: AMMONIA,  in the last 72 hours CBC:  Recent Labs  09/16/12 0358  WBC 8.8  NEUTROABS 5.1  HGB 9.2*  HCT 30.1*  MCV 104.9*  PLT 318   Cardiac Enzymes:  Recent Labs  09/15/12 1659 09/15/12 2304 09/16/12 0358  TROPONINI <0.30 <0.30 <0.30   BNP: No results found for this basename: PROBNP,  in the last 72 hours D-Dimer: No results found for this basename: DDIMER,  in the last 72 hours CBG:  Recent Labs  09/16/12 2130 09/17/12 0734 09/17/12 1152 09/17/12 1648 09/17/12 2128 09/18/12 0746   GLUCAP 118* 94 131* 171* 137* 90   Hemoglobin A1C: No results found for this basename: HGBA1C,  in the last 72 hours Fasting Lipid Panel: No results found for this basename: CHOL, HDL, LDLCALC, TRIG, CHOLHDL, LDLDIRECT,  in the last 72 hours Thyroid Function Tests: No results found for this basename: TSH, T4TOTAL, FREET4, T3FREE, THYROIDAB,  in the last 72 hours Anemia Panel:  Recent Labs  09/16/12 0358  FERRITIN 638*   Coagulation: No results found for this basename: LABPROT, INR,  in the last 72 hours Urine Drug Screen: Drugs of Abuse  No results found for this basename: labopia, cocainscrnur, labbenz, amphetmu, thcu, labbarb    Alcohol Level: No results found for this basename: ETH,  in the last 72 hours Urinalysis: No results found for this basename: COLORURINE, APPERANCEUR, LABSPEC, PHURINE, GLUCOSEU, HGBUR, BILIRUBINUR, KETONESUR, PROTEINUR, UROBILINOGEN, NITRITE, LEUKOCYTESUR,  in the last 72 hours Misc. Labs:  ABGS No results found for this basename: PHART, PCO2, PO2ART, TCO2, HCO3,  in the last 72 hours CULTURES Recent Results (from the past 240 hour(s))  URINE CULTURE     Status: None   Collection Time    09/14/12  1:30 PM      Result Value Range Status   Specimen Description URINE, CLEAN CATCH   Final   Special Requests NONE   Final   Culture  Setup Time 09/15/2012  01:59   Final   Colony Count >=100,000 COLONIES/ML   Final   Culture ENTEROCOCCUS SPECIES   Final   Report Status 09/18/2012 FINAL   Final   Organism ID, Bacteria ENTEROCOCCUS SPECIES   Final  MRSA PCR SCREENING     Status: Abnormal   Collection Time    09/15/12  1:49 AM      Result Value Range Status   MRSA by PCR POSITIVE (*) NEGATIVE Final   Comment:            The GeneXpert MRSA Assay (FDA     approved for NASAL specimens     only), is one component of a     comprehensive MRSA colonization     surveillance program. It is not     intended to diagnose MRSA     infection nor to guide or      monitor treatment for     MRSA infections.     RESULT CALLED TO, READ BACK BY AND VERIFIED WITH:     WEBB C AT 0350 ON [id number] BY FORSYTH K   Studies/Results: No results found.  Medications:  Prior to Admission:  Prescriptions prior to admission  Medication Sig Dispense Refill  . ALPRAZolam (XANAX) 0.5 MG tablet Take 0.5 mg by mouth 3 (three) times daily as needed for sleep.       Marland Kitchen aspirin 81 MG chewable tablet Chew 81 mg by mouth daily.      Marland Kitchen atorvastatin (LIPITOR) 80 MG tablet Take 80 mg by mouth daily.      . calcitRIOL (ROCALTROL) 0.25 MCG capsule Take 0.25 mcg by mouth daily.      Marland Kitchen diltiazem (CARDIZEM) 120 MG tablet Take 120 mg by mouth daily.       . feeding supplement (PRO-STAT SUGAR FREE 64) LIQD Take 30 mLs by mouth 3 (three) times daily with meals.      . fesoterodine (TOVIAZ) 8 MG TB24 Take 8 mg by mouth daily.      . fluticasone (FLONASE) 50 MCG/ACT nasal spray Place 2 sprays into the nose daily.      Marland Kitchen gabapentin (NEURONTIN) 100 MG capsule Take 100 mg by mouth 3 (three) times daily.      Marland Kitchen HYDROcodone-acetaminophen (NORCO) 10-325 MG per tablet Take 1 tablet by mouth every 6 (six) hours as needed for pain.      Marland Kitchen insulin aspart (NOVOLOG) 100 UNIT/ML injection Inject 3-15 Units into the skin 3 (three) times daily before meals. 121-150=3 units, 151-200=4 units, 201-250=7 units, 251-300=9 units, 301-350=12 units, 351-400=15 units.      . insulin detemir (LEVEMIR) 100 UNIT/ML injection Inject 10 Units into the skin at bedtime.      Marland Kitchen levothyroxine (SYNTHROID, LEVOTHROID) 200 MCG tablet Take 200 mcg by mouth daily before breakfast.      . Nutritional Supplements (ENSURE NUTRITION SHAKE PO) Take by mouth at bedtime.      Marland Kitchen omeprazole (PRILOSEC) 40 MG capsule Take 40 mg by mouth 2 (two) times daily.      . ondansetron (ZOFRAN) 4 MG tablet Take 4 mg by mouth every 8 (eight) hours as needed for nausea.      . polyethylene glycol (MIRALAX / GLYCOLAX) packet Take 17 g by mouth  daily.      . predniSONE (DELTASONE) 5 MG tablet Take 5 mg by mouth daily.      . promethazine (PHENERGAN) 12.5 MG tablet Take 12.5 mg by mouth every 6 (six) hours as  needed for nausea.      Marland Kitchen torsemide (DEMADEX) 20 MG tablet Take 80 mg by mouth 2 (two) times daily.      . vitamin B-12 (CYANOCOBALAMIN) 1000 MCG tablet Take 1,000 mcg by mouth daily.       Scheduled: . antiseptic oral rinse  15 mL Mouth Rinse BID  . aspirin  81 mg Oral Daily  . calcitRIOL  0.25 mcg Oral Daily  . cefTRIAXone (ROCEPHIN)  IV  1 g Intravenous Q24H  . Chlorhexidine Gluconate Cloth  6 each Topical Q0600  . ciprofloxacin  200 mg Intravenous Q12H  . diltiazem  120 mg Oral Daily  . feeding supplement  30 mL Oral TID WC  . fesoterodine  8 mg Oral Daily  . fluticasone  2 spray Each Nare Daily  . gabapentin  100 mg Oral TID  . insulin aspart  0-20 Units Subcutaneous TID WC  . insulin aspart  0-5 Units Subcutaneous QHS  . insulin detemir  10 Units Subcutaneous QHS  . levothyroxine  200 mcg Oral QAC breakfast  . mupirocin ointment  1 application Nasal BID  . pantoprazole  40 mg Oral BID AC  . polyethylene glycol  17 g Oral Daily  . potassium chloride  10 mEq Intravenous Q1 Hr x 6  . predniSONE  5 mg Oral Daily  . sodium chloride  3 mL Intravenous Q12H  . torsemide  80 mg Oral BID  . vitamin B-12  1,000 mcg Oral Daily   Continuous: . sodium chloride 75 mL/hr at 09/17/12 0247   UJW:[id number], acetaminophen, albuterol, ALPRAZolam, butalbital-acetaminophen-caffeine, HYDROcodone-acetaminophen, morphine injection, ondansetron (ZOFRAN) IV, ondansetron  Assesment: She was admitted with intractable nausea and vomiting. EGD showed esophagitis and gastritis. She has multiple other medical problem. She's complaining of headache now. She is hypokalemic this morning. Principal Problem:   Intractable nausea and vomiting Active Problems:   GASTROESOPHAGEAL REFLUX DISEASE   Cellulitis of leg without foot, left    Diabetes mellitus type 2 with complications   Chronic diastolic heart failure   Adrenal insufficiency   Bilateral lower extremity edema   Chronic kidney disease, stage III (moderate)   Atrial fibrillation   Anemia   Hydronephrosis    Plan: Replace her potassium. She will have gastric emptying study. I will give her Fioricet for the headache. She will have PT consultation.    LOS: 4 days   Doylene Splinter L 09/18/2012, 8:59 AM

## 2012-09-18 NOTE — Care Management Note (Addendum)
    Page 1 of 2   09/22/2012     11:44:06 AM   CARE MANAGEMENT NOTE 09/22/2012  Patient:  Ann [name], Ann [name]   Account Number:  [id number]  Date Initiated:  09/15/2012  Documentation initiated by:  Sharrie Rothman  Subjective/Objective Assessment:   Pt admitted from [facility] Pt and pts husband not sure at this time if they want to return to SNF at discharge. If returns home will need HH at discharge.     Action/Plan:   Will continue to follow for [facility] needs if pt returns home. If pt returns to [facility], CSW will arrange discharge to facility.   Anticipated DC Date:  09/22/2012   Anticipated DC Plan:  HOME W HOME HEALTH SERVICES  In-house referral  Clinical Social Worker      DC Planning Services  CM consult      [facility] Choice  HOME HEALTH   Choice offered to / List presented to:  C-1 Patient        HH arranged  HH-1 RN  HH-2 PT      [facility] agency  [facility]   Status of service:  Completed, signed off Medicare Important Message given?  YES (If response is "NO", the following Medicare IM given date fields will be blank) Date Medicare IM given:  09/18/2012 Date Additional Medicare IM given:  09/21/2012  Discharge Disposition:  HOME W HOME HEALTH SERVICES  Per UR Regulation:    If discussed at Long Length of Stay Meetings, dates discussed:   09/22/2012    Comments:  09/22/12 Rosemary Holms RN BSN CM Pt at rest on RA sats were 80%. HH PT RN set up with [facility]. O2 through [facility] due to chronic heart failure and COPD  09/21/12 Alden Bensinger Leanord Hawking RN BSN CM Pt states she is ready to go home as soon as the dr let her go.  09/18/12 1445 Anibal Henderson RN Pt was at [facility] for rehab, but she and spouse feel she has improved and that she can return home now with [facility]. He has also applied for Aide and Attendance for her through the [place], has been approved and is just waiting for a direct deposit  to hirer an aide for up to 10hrs /day. They use [facility] for their [facility] agency and this is set up with L.  [place], Charity fundraiser. and info placed in chart 09/15/12 1411 Arlyss Queen, RN BSN CM

## 2012-09-19 DIAGNOSIS — E876 Hypokalemia: Secondary | ICD-10-CM | POA: Diagnosis not present

## 2012-09-19 DIAGNOSIS — R112 Nausea with vomiting, unspecified: Secondary | ICD-10-CM | POA: Diagnosis not present

## 2012-09-19 LAB — GLUCOSE, CAPILLARY
Glucose-Capillary: 128 mg/dL — ABNORMAL HIGH (ref 70–99)
Glucose-Capillary: 168 mg/dL — ABNORMAL HIGH (ref 70–99)

## 2012-09-19 MED ORDER — HYDROCORTISONE SOD SUCCINATE 100 MG IJ SOLR
50.0000 mg | Freq: Two times a day (BID) | INTRAMUSCULAR | Status: DC
  Administered 2012-09-19 – 2012-09-22 (×7): 50 mg via INTRAVENOUS
  Filled 2012-09-19 (×7): qty 2

## 2012-09-19 MED ORDER — METOCLOPRAMIDE HCL 5 MG/ML IJ SOLN
10.0000 mg | Freq: Four times a day (QID) | INTRAMUSCULAR | Status: DC
  Administered 2012-09-19 – 2012-09-21 (×8): 10 mg via INTRAVENOUS
  Filled 2012-09-19 (×8): qty 2

## 2012-09-19 MED ORDER — SODIUM CHLORIDE 0.9 % IJ SOLN
10.0000 mL | Freq: Two times a day (BID) | INTRAMUSCULAR | Status: DC
  Administered 2012-09-19 – 2012-09-21 (×5): 10 mL via INTRAVENOUS

## 2012-09-19 NOTE — Progress Notes (Signed)
Subjective: She says she feels a little bit better. She is still having some trouble with keeping solid food down. She has no other new complaints  Objective: Vital signs in last 24 hours: Temp:  [97.7 F (36.5 C)-98.1 F (36.7 C)] 98.1 F (36.7 C) (04/26 0431) Pulse Rate:  [80-99] 99 (04/26 0902) Resp:  [18-20] 18 (04/26 0431) BP: (98-111)/(54-65) 100/65 mmHg (04/26 0902) SpO2:  [92 %-98 %] 95 % (04/26 0902) Weight:  [130.228 kg (287 lb 1.6 oz)] 130.228 kg (287 lb 1.6 oz) (04/26 0431) Weight change: 0.928 kg (2 lb 0.7 oz) Last BM Date: 09/16/12  Intake/Output from previous day: 04/25 0701 - 04/26 0700 In: 7105.5 [P.O.:960; I.V.:5395.5; IV Piggyback:750] Out: 675 [Urine:675]  PHYSICAL EXAM General appearance: alert, cooperative, mild distress and morbidly obese Resp: clear to auscultation bilaterally Cardio: regular rate and rhythm, S1, S2 normal, no murmur, click, rub or gallop GI: soft, non-tender; bowel sounds normal; no masses,  no organomegaly Extremities: extremities normal, atraumatic, no cyanosis or edema  Lab Results:    Basic Metabolic Panel:  Recent Labs  16/10/96 0510  NA 142  K 2.6*  CL 102  CO2 32  GLUCOSE 87  BUN 11  CREATININE 0.96  CALCIUM 8.1*   Liver Function Tests:  Recent Labs  09/18/12 0500  AST 72*  ALT 34  ALKPHOS 356*  BILITOT 0.5  PROT 5.4*  ALBUMIN 1.7*   No results found for this basename: LIPASE, AMYLASE,  in the last 72 hours No results found for this basename: AMMONIA,  in the last 72 hours CBC: No results found for this basename: WBC, NEUTROABS, HGB, HCT, MCV, PLT,  in the last 72 hours Cardiac Enzymes: No results found for this basename: CKTOTAL, CKMB, CKMBINDEX, TROPONINI,  in the last 72 hours BNP: No results found for this basename: PROBNP,  in the last 72 hours D-Dimer: No results found for this basename: DDIMER,  in the last 72 hours CBG:  Recent Labs  09/17/12 2128 09/18/12 0746 09/18/12 1131  09/18/12 1619 09/18/12 2122 09/19/12 0739  GLUCAP 137* 90 123* 133* 139* 79   Hemoglobin A1C: No results found for this basename: HGBA1C,  in the last 72 hours Fasting Lipid Panel: No results found for this basename: CHOL, HDL, LDLCALC, TRIG, CHOLHDL, LDLDIRECT,  in the last 72 hours Thyroid Function Tests: No results found for this basename: TSH, T4TOTAL, FREET4, T3FREE, THYROIDAB,  in the last 72 hours Anemia Panel: No results found for this basename: VITAMINB12, FOLATE, FERRITIN, TIBC, IRON, RETICCTPCT,  in the last 72 hours Coagulation: No results found for this basename: LABPROT, INR,  in the last 72 hours Urine Drug Screen: Drugs of Abuse  No results found for this basename: labopia, cocainscrnur, labbenz, amphetmu, thcu, labbarb    Alcohol Level: No results found for this basename: ETH,  in the last 72 hours Urinalysis: No results found for this basename: COLORURINE, APPERANCEUR, LABSPEC, PHURINE, GLUCOSEU, HGBUR, BILIRUBINUR, KETONESUR, PROTEINUR, UROBILINOGEN, NITRITE, LEUKOCYTESUR,  in the last 72 hours Misc. Labs:  ABGS No results found for this basename: PHART, PCO2, PO2ART, TCO2, HCO3,  in the last 72 hours CULTURES Recent Results (from the past 240 hour(s))  URINE CULTURE     Status: None   Collection Time    09/14/12  1:30 PM      Result Value Range Status   Specimen Description URINE, CLEAN CATCH   Final   Special Requests NONE   Final   Culture  Setup Time 09/15/2012  01:59   Final   Colony Count >=100,000 COLONIES/ML   Final   Culture ENTEROCOCCUS SPECIES   Final   Report Status 09/18/2012 FINAL   Final   Organism ID, Bacteria ENTEROCOCCUS SPECIES   Final  MRSA PCR SCREENING     Status: Abnormal   Collection Time    09/15/12  1:49 AM      Result Value Range Status   MRSA by PCR POSITIVE (*) NEGATIVE Final   Comment:            The GeneXpert MRSA Assay (FDA     approved for NASAL specimens     only), is one component of a     comprehensive MRSA  colonization     surveillance program. It is not     intended to diagnose MRSA     infection nor to guide or     monitor treatment for     MRSA infections.     RESULT CALLED TO, READ BACK BY AND VERIFIED WITH:     WEBB C AT 0350 ON [id number] BY FORSYTH K   Studies/Results: No results found.  Medications:  Prior to Admission:  Prescriptions prior to admission  Medication Sig Dispense Refill  . ALPRAZolam (XANAX) 0.5 MG tablet Take 0.5 mg by mouth 3 (three) times daily as needed for sleep.       Marland Kitchen aspirin 81 MG chewable tablet Chew 81 mg by mouth daily.      Marland Kitchen atorvastatin (LIPITOR) 80 MG tablet Take 80 mg by mouth daily.      . calcitRIOL (ROCALTROL) 0.25 MCG capsule Take 0.25 mcg by mouth daily.      Marland Kitchen diltiazem (CARDIZEM) 120 MG tablet Take 120 mg by mouth daily.       . feeding supplement (PRO-STAT SUGAR FREE 64) LIQD Take 30 mLs by mouth 3 (three) times daily with meals.      . fesoterodine (TOVIAZ) 8 MG TB24 Take 8 mg by mouth daily.      . fluticasone (FLONASE) 50 MCG/ACT nasal spray Place 2 sprays into the nose daily.      Marland Kitchen gabapentin (NEURONTIN) 100 MG capsule Take 100 mg by mouth 3 (three) times daily.      Marland Kitchen HYDROcodone-acetaminophen (NORCO) 10-325 MG per tablet Take 1 tablet by mouth every 6 (six) hours as needed for pain.      Marland Kitchen insulin aspart (NOVOLOG) 100 UNIT/ML injection Inject 3-15 Units into the skin 3 (three) times daily before meals. 121-150=3 units, 151-200=4 units, 201-250=7 units, 251-300=9 units, 301-350=12 units, 351-400=15 units.      . insulin detemir (LEVEMIR) 100 UNIT/ML injection Inject 10 Units into the skin at bedtime.      Marland Kitchen levothyroxine (SYNTHROID, LEVOTHROID) 200 MCG tablet Take 200 mcg by mouth daily before breakfast.      . Nutritional Supplements (ENSURE NUTRITION SHAKE PO) Take by mouth at bedtime.      Marland Kitchen omeprazole (PRILOSEC) 40 MG capsule Take 40 mg by mouth 2 (two) times daily.      . ondansetron (ZOFRAN) 4 MG tablet Take 4 mg by mouth every 8  (eight) hours as needed for nausea.      . polyethylene glycol (MIRALAX / GLYCOLAX) packet Take 17 g by mouth daily.      . predniSONE (DELTASONE) 5 MG tablet Take 5 mg by mouth daily.      . promethazine (PHENERGAN) 12.5 MG tablet Take 12.5 mg by mouth every 6 (six) hours as  needed for nausea.      Marland Kitchen torsemide (DEMADEX) 20 MG tablet Take 80 mg by mouth 2 (two) times daily.      . vitamin B-12 (CYANOCOBALAMIN) 1000 MCG tablet Take 1,000 mcg by mouth daily.       Scheduled: . antiseptic oral rinse  15 mL Mouth Rinse BID  . aspirin  81 mg Oral Daily  . calcitRIOL  0.25 mcg Oral Daily  . cefTRIAXone (ROCEPHIN)  IV  1 g Intravenous Q24H  . Chlorhexidine Gluconate Cloth  6 each Topical Q0600  . ciprofloxacin  200 mg Intravenous Q12H  . diltiazem  120 mg Oral Daily  . feeding supplement  30 mL Oral TID WC  . fesoterodine  8 mg Oral Daily  . fluticasone  2 spray Each Nare Daily  . gabapentin  100 mg Oral TID  . insulin aspart  0-20 Units Subcutaneous TID WC  . insulin aspart  0-5 Units Subcutaneous QHS  . insulin detemir  10 Units Subcutaneous QHS  . levothyroxine  200 mcg Oral QAC breakfast  . metoCLOPramide (REGLAN) injection  5 mg Intravenous Q6H  . mupirocin ointment  1 application Nasal BID  . pantoprazole  40 mg Oral BID AC  . polyethylene glycol  17 g Oral Daily  . predniSONE  5 mg Oral Daily  . sodium chloride  10 mL Intravenous Q12H  . sodium chloride  3 mL Intravenous Q12H  . torsemide  80 mg Oral BID  . vitamin B-12  1,000 mcg Oral Daily   Continuous: . sodium chloride 75 mL/hr at 09/19/12 0553   NFA:[id number], acetaminophen, albuterol, ALPRAZolam, butalbital-acetaminophen-caffeine, HYDROcodone-acetaminophen, morphine injection, ondansetron (ZOFRAN) IV, ondansetron  Assesment: She continues to have nausea. She is somewhat better on Reglan. She had been on higher doses of steroids because of her adrenal insufficiency and this has been modified while she's been in a  skilled care facility partially in an attempt to reduce her anasarca. However I agree that she may be having trouble from adrenal insufficiency which is also contributing to her nausea in addition to gastroparesis gastritis etc. her other medical problems are pretty well controlled. Management of her adrenal insufficiency is complicated by her diabetes Principal Problem:   Intractable nausea and vomiting Active Problems:   GASTROESOPHAGEAL REFLUX DISEASE   Cellulitis of leg without foot, left   Diabetes mellitus type 2 with complications   Chronic diastolic heart failure   Adrenal insufficiency   Bilateral lower extremity edema   Chronic kidney disease, stage III (moderate)   Atrial fibrillation   Anemia   Hydronephrosis    Plan: I'm going to put her on IV steroids briefly and see if it makes any difference    LOS: 5 days   Kirstine Jacquin L 09/19/2012, 9:13 AM

## 2012-09-19 NOTE — Progress Notes (Signed)
Subjective: Patient reports a little less nausea. Still having dry heaves, however. No apparent side effects related to Reglan.  Objective: Vital signs in last 24 hours: Temp:  [97.7 F (36.5 C)-98.1 F (36.7 C)] 98.1 F (36.7 C) (04/26 0431) Pulse Rate:  [80-99] 99 (04/26 0902) Resp:  [18-20] 18 (04/26 0431) BP: (98-111)/(54-65) 100/65 mmHg (04/26 0902) SpO2:  [92 %-98 %] 95 % (04/26 0902) Weight:  [287 lb 1.6 oz (130.228 kg)] 287 lb 1.6 oz (130.228 kg) (04/26 0431) Last BM Date: 09/16/12 (miralax as ordered) General:   Awake alert. Accompanied by husband. Appears not to feel well.  Abdomen:  Nondistended. No succussion splash Normal bowel sounds, without guarding, and without rebound.  No mass or organomegaly. Extremities:  Without clubbing or edema.    Intake/Output from previous day: 04/25 0701 - 04/26 0700 In: 7105.5 [P.O.:960; I.V.:5395.5; IV Piggyback:750] Out: 675 [Urine:675] Intake/Output this shift: Total I/O In: 10 [I.V.:10] Out: 225 [Urine:225]  Lab Results: No results found for this basename: WBC, HGB, HCT, PLT,  in the last 72 hours BMET  Recent Labs  09/18/12 0510  NA 142  K 2.6*  CL 102  CO2 32  GLUCOSE 87  BUN 11  CREATININE 0.96  CALCIUM 8.1*   LFT  Recent Labs  09/18/12 0500  PROT 5.4*  ALBUMIN 1.7*  AST 72*  ALT 34  ALKPHOS 356*  BILITOT 0.5  BILIDIR 0.3  IBILI 0.2*    Impression: Ongoing nausea and vomiting in the setting of acute illness. Likely gastroparesis. Some modest improvement with low-dose Reglan. I agree with going back on IV corticosteroids for the time being.   Recommendations:   We'll increase Reglan to 10 mg a.c. and at bedtime (IV every 6 hours).                                      Further evaluation of liver issues as an outpatient.

## 2012-09-20 DIAGNOSIS — R112 Nausea with vomiting, unspecified: Secondary | ICD-10-CM | POA: Diagnosis not present

## 2012-09-20 DIAGNOSIS — E876 Hypokalemia: Secondary | ICD-10-CM | POA: Diagnosis not present

## 2012-09-20 LAB — GLUCOSE, CAPILLARY

## 2012-09-20 LAB — BASIC METABOLIC PANEL
CO2: 32 mEq/L (ref 19–32)
Glucose, Bld: 196 mg/dL — ABNORMAL HIGH (ref 70–99)
Potassium: 2.4 mEq/L — CL (ref 3.5–5.1)
Sodium: 143 mEq/L (ref 135–145)

## 2012-09-20 MED ORDER — SODIUM CHLORIDE 0.9 % IV SOLN
1.0000 g | Freq: Four times a day (QID) | INTRAVENOUS | Status: DC
  Administered 2012-09-20 – 2012-09-22 (×8): 1 g via INTRAVENOUS
  Filled 2012-09-20 (×13): qty 1000

## 2012-09-20 MED ORDER — POTASSIUM CHLORIDE 10 MEQ/100ML IV SOLN
10.0000 meq | INTRAVENOUS | Status: AC
  Administered 2012-09-20 (×4): 10 meq via INTRAVENOUS
  Filled 2012-09-20: qty 400

## 2012-09-20 NOTE — Progress Notes (Signed)
Patient states nausea much better over the past 24 hours-Reglan dosing increased to 10 mg No apparent side effects with this agent noted   Vital signs in last 24 hours: Temp:  [97.5 F (36.4 C)-98 F (36.7 C)] 97.7 F (36.5 C) (04/27 0650) Pulse Rate:  [74-81] 74 (04/27 0650) Resp:  [18] 18 (04/27 0650) BP: (100-116)/(63-74) 116/74 mmHg (04/27 0650) SpO2:  [94 %-95 %] 94 % (04/27 0650) Weight:  [287 lb 4.8 oz (130.318 kg)] 287 lb 4.8 oz (130.318 kg) (04/27 0650) Last BM Date: 09/20/12 General:   Appears more alert not nearly as distressed as when I saw her yesterday. Abdomen:  Obese. Positive bowel sounds. Soft and nontender  Extremities:  Without clubbing or edema.    Intake/Output from previous day: 04/26 0701 - 04/27 0700 In: 3017.5 [P.O.:920; I.V.:1847.5; IV Piggyback:250] Out: 1600 [Urine:1600] Intake/Output this shift:    Lab Results: No results found for this basename: WBC, HGB, HCT, PLT,  in the last 72 hours BMET  Recent Labs  09/18/12 0510  NA 142  K 2.6*  CL 102  CO2 32  GLUCOSE 87  BUN 11  CREATININE 0.96  CALCIUM 8.1*   LFT  Recent Labs  09/18/12 0500  PROT 5.4*  ALBUMIN 1.7*  AST 72*  ALT 34  ALKPHOS 356*  BILITOT 0.5  BILIDIR 0.3  IBILI 0.2*   Impression:   Probable diabetic gastroparesis improving on escalating dosing of Reglan. Cholestatic liver disease. Positive AMA. Adrenal insufficiency-on corticosteroids   Recommendations:  Will continue IV Reglan for now. We'll likely transition her to oral therapy in the near future. Gastroparesis/bariatric diet. Further evaluation of liver disease as an outpatient.

## 2012-09-20 NOTE — Plan of Care (Signed)
Problem: Phase I Progression Outcomes Goal: OOB as tolerated unless otherwise ordered Outcome: Completed/Met Date Met:  09/20/12 Pt up to chair with assist     Problem: Phase II Progression Outcomes Goal: Discharge plan established Outcome: Completed/Met Date Met:  09/20/12 Plan to go home with [facility] at [place]

## 2012-09-20 NOTE — Progress Notes (Signed)
CRITICAL VALUE ALERT  Critical value received:  K+ - 2.4  Date of notification:  09/20/12  Time of notification:  1230  Critical value read back:yes  Nurse who received alert:  Sherrye Payor, RN  MD notified (1st page):  Dr. Ouida Sills already ordered K+ runs  Time of first page:    MD notified (2nd page):  Time of second page:  Responding MD:    Time MD responded:

## 2012-09-21 DIAGNOSIS — K3184 Gastroparesis: Secondary | ICD-10-CM | POA: Diagnosis not present

## 2012-09-21 DIAGNOSIS — R112 Nausea with vomiting, unspecified: Secondary | ICD-10-CM | POA: Diagnosis not present

## 2012-09-21 DIAGNOSIS — E876 Hypokalemia: Secondary | ICD-10-CM | POA: Diagnosis not present

## 2012-09-21 LAB — CBC
HCT: 29.3 % — ABNORMAL LOW (ref 36.0–46.0)
Hemoglobin: 9.7 g/dL — ABNORMAL LOW (ref 12.0–15.0)
WBC: 8.8 10*3/uL (ref 4.0–10.5)

## 2012-09-21 LAB — BASIC METABOLIC PANEL
Chloride: 101 mEq/L (ref 96–112)
GFR calc Af Amer: 78 mL/min — ABNORMAL LOW (ref >90)
Potassium: 2.6 mEq/L — CL (ref 3.5–5.1)
Sodium: 143 mEq/L (ref 135–145)

## 2012-09-21 LAB — GLUCOSE, CAPILLARY: Glucose-Capillary: 146 mg/dL — ABNORMAL HIGH (ref 70–99)

## 2012-09-21 MED ORDER — METOCLOPRAMIDE HCL 10 MG PO TABS
10.0000 mg | ORAL_TABLET | Freq: Three times a day (TID) | ORAL | Status: DC
  Administered 2012-09-21 – 2012-09-22 (×4): 10 mg via ORAL
  Filled 2012-09-21 (×4): qty 1

## 2012-09-21 MED ORDER — POTASSIUM CHLORIDE 10 MEQ/100ML IV SOLN
10.0000 meq | INTRAVENOUS | Status: AC
  Administered 2012-09-21 (×5): 10 meq via INTRAVENOUS
  Filled 2012-09-21 (×4): qty 100

## 2012-09-21 MED ORDER — POTASSIUM CHLORIDE 10 MEQ/100ML IV SOLN
INTRAVENOUS | Status: AC
  Filled 2012-09-21: qty 100

## 2012-09-21 MED ORDER — POTASSIUM CHLORIDE 10 MEQ/100ML IV SOLN
10.0000 meq | Freq: Once | INTRAVENOUS | Status: AC
  Administered 2012-09-21: 10 meq via INTRAVENOUS
  Filled 2012-09-21: qty 100

## 2012-09-21 NOTE — Progress Notes (Signed)
Subjective: No N/V, tolerated diet. BM this morning. No concerns.   Objective: Vital signs in last 24 hours: Temp:  [97.8 F (36.6 C)-97.9 F (36.6 C)] 97.8 F (36.6 C) (04/28 0509) Pulse Rate:  [70-79] 75 (04/28 0509) Resp:  [20-22] 22 (04/28 0509) BP: (100-125)/(59-61) 125/61 mmHg (04/28 0509) SpO2:  [91 %-93 %] 91 % (04/28 0654) Weight:  [287 lb 11.2 oz (130.5 kg)] 287 lb 11.2 oz (130.5 kg) (04/28 0509) Last BM Date: 09/20/12 General:   Alert and oriented, pleasant Head:  Normocephalic and atraumatic. Eyes:  No icterus, sclera clear. Conjuctiva pink.  Heart:  S1, S2 present, no murmurs noted.  Lungs: Clear to auscultation bilaterally Abdomen:  Bowel sounds present, soft, non-tender, non-distended. Difficult to appreciate HSM due to body habitus Msk:  Symmetrical without gross deformities. Normal posture. Pulses:  Normal pulses noted. Extremities:  2+ pitting edema bilateral lower extremities Neurologic:  Alert and  oriented x4;  grossly normal neurologically. Skin:  Warm and dry, intact without significant lesions.  Psych:  Alert and cooperative. Normal mood and affect.  Intake/Output from previous day: 04/27 0701 - 04/28 0700 In: 3745 [P.O.:1440; I.V.:1755; IV Piggyback:550] Out: 1400 [Urine:1400] Intake/Output this shift: Total I/O In: -  Out: 250 [Urine:250]  Lab Results:  Recent Labs  09/21/12 0455  WBC 8.8  HGB 9.7*  HCT 29.3*  PLT 323   BMET  Recent Labs  09/20/12 1125 09/21/12 0455  NA 143 143  K 2.4* 2.6*  CL 101 101  CO2 32 34*  GLUCOSE 196* 163*  BUN 10 11  CREATININE 0.92 0.87  CALCIUM 7.4* 7.9*     Assessment: 67 year old female admitted with intractable N/V, likely diabetic gastroparesis. Continues to improve clinically, responding well to Reglan 10 mg with meals and at bedtime. Will transition to oral route with hopeful d/c in near future. Work-up for elevated LFTs show a positive AMA, concern for PBC. Will need outpatient work-up.     Plan: Hypokalemia per attending Change Reglan from IV to po Gastroparesis diet PPI BID Outpatient follow-up regarding +AMA, liver disease Hopeful d/c in near future  Nira Retort, ANP-BC [facility]     LOS: 7 days    09/21/2012, 9:05 AM

## 2012-09-21 NOTE — Progress Notes (Signed)
CRITICAL VALUE ALERT  Critical value received:  K+ 2.6  Date of notification:  09/21/12  Time of notification:  0630  Critical value read back: yes  Nurse who received alert:  Tobie Poet RN  MD notified (1st page):  Dr. Ouida Sills  Time of first page:  [phone]  MD notified (2nd page):  Time of second page:  Responding MD:  Dr. Ouida Sills  Time MD responded:  [phone]  Giving 6 runs of K+

## 2012-09-21 NOTE — Progress Notes (Signed)
Subjective: She says she feels better. She has no new complaints. Her nausea is better she is able to eat.  Objective: Vital signs in last 24 hours: Temp:  [97.8 F (36.6 C)-97.9 F (36.6 C)] 97.8 F (36.6 C) (04/28 0509) Pulse Rate:  [70-79] 75 (04/28 0509) Resp:  [20-22] 22 (04/28 0509) BP: (100-125)/(59-61) 125/61 mmHg (04/28 0509) SpO2:  [91 %-93 %] 91 % (04/28 0654) Weight:  [130.5 kg (287 lb 11.2 oz)] 130.5 kg (287 lb 11.2 oz) (04/28 0509) Weight change: 0.181 kg (6.4 oz) Last BM Date: 09/20/12  Intake/Output from previous day: 04/27 0701 - 04/28 0700 In: 3745 [P.O.:1440; I.V.:1755; IV Piggyback:550] Out: 1400 [Urine:1400]  PHYSICAL EXAM General appearance: alert, cooperative, mild distress and morbidly obese Resp: clear to auscultation bilaterally Cardio: irregularly irregular rhythm GI: soft, non-tender; bowel sounds normal; no masses,  no organomegaly Extremities: extremities normal, atraumatic, no cyanosis or edema  Lab Results:    Basic Metabolic Panel:  Recent Labs  30/86/57 1125 09/21/12 0455  NA 143 143  K 2.4* 2.6*  CL 101 101  CO2 32 34*  GLUCOSE 196* 163*  BUN 10 11  CREATININE 0.92 0.87  CALCIUM 7.4* 7.9*   Liver Function Tests: No results found for this basename: AST, ALT, ALKPHOS, BILITOT, PROT, ALBUMIN,  in the last 72 hours No results found for this basename: LIPASE, AMYLASE,  in the last 72 hours No results found for this basename: AMMONIA,  in the last 72 hours CBC:  Recent Labs  09/21/12 0455  WBC 8.8  HGB 9.7*  HCT 29.3*  MCV 101.7*  PLT 323   Cardiac Enzymes: No results found for this basename: CKTOTAL, CKMB, CKMBINDEX, TROPONINI,  in the last 72 hours BNP: No results found for this basename: PROBNP,  in the last 72 hours D-Dimer: No results found for this basename: DDIMER,  in the last 72 hours CBG:  Recent Labs  09/19/12 2156 09/20/12 0741 09/20/12 1200 09/20/12 1659 09/20/12 2124 09/21/12 0749  GLUCAP 168*  147* 174* 152* 129* 145*   Hemoglobin A1C: No results found for this basename: HGBA1C,  in the last 72 hours Fasting Lipid Panel: No results found for this basename: CHOL, HDL, LDLCALC, TRIG, CHOLHDL, LDLDIRECT,  in the last 72 hours Thyroid Function Tests: No results found for this basename: TSH, T4TOTAL, FREET4, T3FREE, THYROIDAB,  in the last 72 hours Anemia Panel: No results found for this basename: VITAMINB12, FOLATE, FERRITIN, TIBC, IRON, RETICCTPCT,  in the last 72 hours Coagulation: No results found for this basename: LABPROT, INR,  in the last 72 hours Urine Drug Screen: Drugs of Abuse  No results found for this basename: labopia, cocainscrnur, labbenz, amphetmu, thcu, labbarb    Alcohol Level: No results found for this basename: ETH,  in the last 72 hours Urinalysis: No results found for this basename: COLORURINE, APPERANCEUR, LABSPEC, PHURINE, GLUCOSEU, HGBUR, BILIRUBINUR, KETONESUR, PROTEINUR, UROBILINOGEN, NITRITE, LEUKOCYTESUR,  in the last 72 hours Misc. Labs:  ABGS No results found for this basename: PHART, PCO2, PO2ART, TCO2, HCO3,  in the last 72 hours CULTURES Recent Results (from the past 240 hour(s))  URINE CULTURE     Status: None   Collection Time    09/14/12  1:30 PM      Result Value Range Status   Specimen Description URINE, CLEAN CATCH   Final   Special Requests NONE   Final   Culture  Setup Time 09/15/2012 01:59   Final   Colony Count >=100,000 COLONIES/ML  Final   Culture ENTEROCOCCUS SPECIES   Final   Report Status 09/18/2012 FINAL   Final   Organism ID, Bacteria ENTEROCOCCUS SPECIES   Final  MRSA PCR SCREENING     Status: Abnormal   Collection Time    09/15/12  1:49 AM      Result Value Range Status   MRSA by PCR POSITIVE (*) NEGATIVE Final   Comment:            The GeneXpert MRSA Assay (FDA     approved for NASAL specimens     only), is one component of a     comprehensive MRSA colonization     surveillance program. It is not      intended to diagnose MRSA     infection nor to guide or     monitor treatment for     MRSA infections.     RESULT CALLED TO, READ BACK BY AND VERIFIED WITH:     WEBB C AT 0350 ON [id number] BY FORSYTH K   Studies/Results: No results found.  Medications:  Prior to Admission:  Prescriptions prior to admission  Medication Sig Dispense Refill  . ALPRAZolam (XANAX) 0.5 MG tablet Take 0.5 mg by mouth 3 (three) times daily as needed for sleep.       Marland Kitchen aspirin 81 MG chewable tablet Chew 81 mg by mouth daily.      Marland Kitchen atorvastatin (LIPITOR) 80 MG tablet Take 80 mg by mouth daily.      . calcitRIOL (ROCALTROL) 0.25 MCG capsule Take 0.25 mcg by mouth daily.      Marland Kitchen diltiazem (CARDIZEM) 120 MG tablet Take 120 mg by mouth daily.       . feeding supplement (PRO-STAT SUGAR FREE 64) LIQD Take 30 mLs by mouth 3 (three) times daily with meals.      . fesoterodine (TOVIAZ) 8 MG TB24 Take 8 mg by mouth daily.      . fluticasone (FLONASE) 50 MCG/ACT nasal spray Place 2 sprays into the nose daily.      Marland Kitchen gabapentin (NEURONTIN) 100 MG capsule Take 100 mg by mouth 3 (three) times daily.      Marland Kitchen HYDROcodone-acetaminophen (NORCO) 10-325 MG per tablet Take 1 tablet by mouth every 6 (six) hours as needed for pain.      Marland Kitchen insulin aspart (NOVOLOG) 100 UNIT/ML injection Inject 3-15 Units into the skin 3 (three) times daily before meals. 121-150=3 units, 151-200=4 units, 201-250=7 units, 251-300=9 units, 301-350=12 units, 351-400=15 units.      . insulin detemir (LEVEMIR) 100 UNIT/ML injection Inject 10 Units into the skin at bedtime.      Marland Kitchen levothyroxine (SYNTHROID, LEVOTHROID) 200 MCG tablet Take 200 mcg by mouth daily before breakfast.      . Nutritional Supplements (ENSURE NUTRITION SHAKE PO) Take by mouth at bedtime.      Marland Kitchen omeprazole (PRILOSEC) 40 MG capsule Take 40 mg by mouth 2 (two) times daily.      . ondansetron (ZOFRAN) 4 MG tablet Take 4 mg by mouth every 8 (eight) hours as needed for nausea.      . polyethylene  glycol (MIRALAX / GLYCOLAX) packet Take 17 g by mouth daily.      . predniSONE (DELTASONE) 5 MG tablet Take 5 mg by mouth daily.      . promethazine (PHENERGAN) 12.5 MG tablet Take 12.5 mg by mouth every 6 (six) hours as needed for nausea.      Marland Kitchen torsemide (DEMADEX) 20  MG tablet Take 80 mg by mouth 2 (two) times daily.      . vitamin B-12 (CYANOCOBALAMIN) 1000 MCG tablet Take 1,000 mcg by mouth daily.       Scheduled: . ampicillin (OMNIPEN) IV  1 g Intravenous Q6H  . antiseptic oral rinse  15 mL Mouth Rinse BID  . aspirin  81 mg Oral Daily  . calcitRIOL  0.25 mcg Oral Daily  . diltiazem  120 mg Oral Daily  . feeding supplement  30 mL Oral TID WC  . fesoterodine  8 mg Oral Daily  . fluticasone  2 spray Each Nare Daily  . gabapentin  100 mg Oral TID  . hydrocortisone sod succinate (SOLU-CORTEF) injection  50 mg Intravenous Q12H  . insulin aspart  0-20 Units Subcutaneous TID WC  . insulin aspart  0-5 Units Subcutaneous QHS  . insulin detemir  10 Units Subcutaneous QHS  . levothyroxine  200 mcg Oral QAC breakfast  . metoCLOPramide (REGLAN) injection  10 mg Intravenous Q6H  . pantoprazole  40 mg Oral BID AC  . polyethylene glycol  17 g Oral Daily  . potassium chloride  10 mEq Intravenous Q1 Hr x 6  . sodium chloride  10 mL Intravenous Q12H  . torsemide  80 mg Oral BID  . vitamin B-12  1,000 mcg Oral Daily   Continuous: . sodium chloride 75 mL/hr at 09/20/12 2321   WUJ:[id number], acetaminophen, albuterol, ALPRAZolam, butalbital-acetaminophen-caffeine, HYDROcodone-acetaminophen, morphine injection, ondansetron (ZOFRAN) IV, ondansetron  Assesment: She was admitted with intractable nausea and vomiting and is improving. This is felt to be gastroparesis. She has no other new complaints. Her blood sugar has been pretty good. She is on IV Reglan and IV steroids. Principal Problem:   Intractable nausea and vomiting Active Problems:   GASTROESOPHAGEAL REFLUX DISEASE   Cellulitis of leg  without foot, left   Diabetes mellitus type 2 with complications   Chronic diastolic heart failure   Adrenal insufficiency   Bilateral lower extremity edema   Chronic kidney disease, stage III (moderate)   Atrial fibrillation   Anemia   Hydronephrosis    Plan: Probably switch to by mouth Reglan today but I will leave that to the gastroenterologist. I will switch her to oral steroids.    LOS: 7 days   Ann [name] 09/21/2012, 8:10 AM

## 2012-09-21 NOTE — Progress Notes (Signed)
Physical Therapy Treatment Patient Details Name: Ann [name] MRN: [id number] DOB: [birth year] Today's Date: 09/21/2012 Time: 1478-2956 PT Time Calculation (min): 28 min  PT Assessment / Plan / Recommendation Comments on Treatment Session  Pt remains very deconditioned even after almost 2 months in SNF.  She is able to ambulate short distances with a walker but most likely will need to develop independence from a w/c.    Follow Up Recommendations        Does the patient have the potential to tolerate intense rehabilitation     Barriers to Discharge        Equipment Recommendations       Recommendations for Other Services    Frequency     Plan      Precautions / Restrictions     Pertinent Vitals/Pain     Mobility  Transfers Sit to Stand: 6: Modified independent (Device/Increase time);From chair/3-in-1;With upper extremity assist Stand to Sit: 6: Modified independent (Device/Increase time);To chair/3-in-1;With upper extremity assist Ambulation/Gait Ambulation/Gait Assistance: 4: Min guard Ambulation Distance (Feet): 12 Feet Assistive device: Rolling walker Gait Pattern: Within Functional Limits Gait velocity: slow and labored Stairs: No Wheelchair Mobility Wheelchair Mobility: No    Exercises General Exercises - Lower Extremity Ankle Circles/Pumps: AROM;Both;10 reps;Seated Long Arc Quad: AROM;Both;10 reps;Seated Hip ABduction/ADduction: Strengthening;Both;10 reps;Seated Hip Flexion/Marching: AROM;Both;20 reps;Seated Toe Raises: AROM;Both;10 reps;Seated Heel Raises: AROM;Both;10 reps;Seated Other Exercises Other Exercises: trunk rotation seated x 5 reps Other Exercises: shoulder flexion x 5 repetitions   PT Diagnosis:    PT Problem List:   PT Treatment Interventions:     PT Goals Acute Rehab PT Goals Time For Goal Achievement: 09/28/12 PT Goal: Sit to Stand - Progress: Met PT Goal: Ambulate - Progress: Progressing toward goal Pt will Propel Wheelchair: 10  - 50 feet;with supervision PT Goal: Propel Wheelchair - Progress: Goal set today  Visit Information  Last PT Received On: 09/21/12    Subjective Data  Subjective: Pt has no c/o   Cognition       Balance     End of Session PT - End of Session Equipment Utilized During Treatment: Gait belt Activity Tolerance: Patient limited by fatigue Patient left: in chair;with call bell/phone within reach   GP     Myrlene Broker L 09/21/2012, 3:47 PM

## 2012-09-21 NOTE — Progress Notes (Signed)
NAME:  KESSLER, KOPINSKI              ACCOUNT NO.:  [id number]  MEDICAL RECORD NO.:  [id number]  LOCATION:  [facility]                         FACILITY:  [facility]  PHYSICIAN:  Kingsley Callander. Ouida Sills, MD       DATE OF BIRTH:  [birth year]  DATE OF PROCEDURE:  09/20/2012 DATE OF DISCHARGE:                                PROGRESS NOTE   Ms. Capek denies any significant improvement.  Thus far with Reglan rectally, she is still experiencing nausea.  She is trying to eat a moth in this morning.  She has had 1 bite.  She was able to eat a small amount of solid food last night.  Liquids are easier for her.  There has been no side effects thus far with Reglan.  VITAL SIGNS:  Temperature 97.7, pulse 74, respirations 18, blood pressure 116/74. LUNGS:  Clear. HEART:  Regular with no murmurs. ABDOMEN:  Soft and nondistended.  No significant tenderness. EXTREMITIES:  No edema.  She has had 1 bowel movement over the past day.  IMPRESSION/PLAN: 1. Recurrent nausea and vomiting, with probable gastroparesis.     Continue IV Reglan. 2. Hypokalemia.  Her last potassium level was 2.6 on the 25th.     Recheck potassium. 3. Enterococcus urinary tract infection.  She remains on IV Cipro.     Her culture revealed resistance to levofloxacin and modified to     ampicillin. 4. Diabetes.  Glucose has ranged yesterday from 79-171.  Fasting     glucose this morning is 147.  Continue Levemir and NovoLog.     Kingsley Callander. Ouida Sills, MD     ROF/MEDQ  D:  09/20/2012  T:  09/21/2012  Job:  [id number]

## 2012-09-22 ENCOUNTER — Telehealth: Payer: Self-pay | Admitting: Gastroenterology

## 2012-09-22 DIAGNOSIS — R112 Nausea with vomiting, unspecified: Secondary | ICD-10-CM | POA: Diagnosis not present

## 2012-09-22 DIAGNOSIS — E876 Hypokalemia: Secondary | ICD-10-CM | POA: Diagnosis not present

## 2012-09-22 LAB — GLUCOSE, CAPILLARY: Glucose-Capillary: 163 mg/dL — ABNORMAL HIGH (ref 70–99)

## 2012-09-22 MED ORDER — PANTOPRAZOLE SODIUM 40 MG PO TBEC: 40.0000 mg | DELAYED_RELEASE_TABLET | Freq: Two times a day (BID) | ORAL | Status: DC

## 2012-09-22 MED ORDER — HYDROCODONE-ACETAMINOPHEN 5-325 MG PO TABS: 1.0000 | ORAL_TABLET | Freq: Four times a day (QID) | ORAL | Status: DC | PRN

## 2012-09-22 MED ORDER — FLUTICASONE-SALMETEROL 250-50 MCG/DOSE IN AEPB: 1.0000 | INHALATION_SPRAY | Freq: Two times a day (BID) | RESPIRATORY_TRACT | Status: DC

## 2012-09-22 MED ORDER — LEVALBUTEROL HCL 0.63 MG/3ML IN NEBU: 1.0000 | INHALATION_SOLUTION | RESPIRATORY_TRACT | Status: DC | PRN

## 2012-09-22 MED ORDER — SERTRALINE HCL 50 MG PO TABS: 50.0000 mg | ORAL_TABLET | Freq: Every day | ORAL | Status: DC

## 2012-09-22 MED ORDER — TOLTERODINE TARTRATE ER 4 MG PO CP24: 4.0000 mg | ORAL_CAPSULE | Freq: Every day | ORAL | Status: DC

## 2012-09-22 MED ORDER — ROSUVASTATIN CALCIUM 40 MG PO TABS: 40.0000 mg | ORAL_TABLET | Freq: Every day | ORAL | Status: DC

## 2012-09-22 MED ORDER — TORSEMIDE 20 MG PO TABS: 20.0000 mg | ORAL_TABLET | Freq: Two times a day (BID) | ORAL | Status: DC

## 2012-09-22 MED ORDER — ONDANSETRON HCL 4 MG PO TABS: 4.0000 mg | ORAL_TABLET | Freq: Four times a day (QID) | ORAL | Status: DC | PRN

## 2012-09-22 MED ORDER — DILTIAZEM HCL 120 MG PO TABS: 120.0000 mg | ORAL_TABLET | Freq: Every day | ORAL | Status: DC

## 2012-09-22 MED ORDER — IPRATROPIUM BROMIDE 0.02 % IN SOLN: 500.0000 ug | Freq: Four times a day (QID) | RESPIRATORY_TRACT | Status: DC

## 2012-09-22 MED ORDER — PREDNISONE 5 MG PO TABS: 10.0000 mg | ORAL_TABLET | Freq: Every day | ORAL | Status: DC

## 2012-09-22 MED ORDER — ALBUTEROL SULFATE HFA 108 (90 BASE) MCG/ACT IN AERS: 2.0000 | INHALATION_SPRAY | Freq: Four times a day (QID) | RESPIRATORY_TRACT | Status: DC | PRN

## 2012-09-22 MED ORDER — METOCLOPRAMIDE HCL 10 MG PO TABS: 10.0000 mg | ORAL_TABLET | Freq: Three times a day (TID) | ORAL | Status: DC

## 2012-09-22 MED ORDER — DIGOXIN 125 MCG PO TABS: 0.1250 mg | ORAL_TABLET | Freq: Every day | ORAL | Status: DC

## 2012-09-22 NOTE — Plan of Care (Signed)
Problem: Food- and Nutrition-Related Knowledge Deficit (NB-1.1) Goal: Nutrition education Formal process to instruct or train a patient/client in a skill or to impart knowledge to help patients/clients voluntarily manage or modify food choices and eating behavior to maintain or improve health. Outcome: Adequate for Discharge Pt spouse requested diet education related to gastroparesis diet. Handout provided from [employer] and recommendations reviewed. Teach back method utilized. RD contact also given if there are additional questions.

## 2012-09-22 NOTE — Progress Notes (Signed)
Patient oxygen level was 80% on room air.

## 2012-09-22 NOTE — Progress Notes (Signed)
Patient given d/c instructions and prescriptions called in to [facility]. IV cath removed and intact. No swelling or pain at site.

## 2012-09-22 NOTE — Telephone Encounter (Signed)
Please have patient follow-up with Dr. Darrick Penna regarding +AMA, LFTs, recent hospitalization. About 4 weeks or so.

## 2012-09-23 NOTE — Discharge Summary (Signed)
Physician Discharge Summary  Patient ID: Ann [name] MRN: [id number] DOB/AGE: 09/21/1945 67 y.o. Primary Care Physician:Avleen Bordwell L, MD Admit date: 09/14/2012 Discharge date: 09/23/2012    Discharge Diagnoses:   Principal Problem:   Intractable nausea and vomiting Active Problems:   GASTROESOPHAGEAL REFLUX DISEASE   Cellulitis of leg without foot, left   Diabetes mellitus type 2 with complications   Chronic diastolic heart failure   Adrenal insufficiency   Bilateral lower extremity edema   Chronic kidney disease, stage III (moderate)   Atrial fibrillation   Anemia   Hydronephrosis  gastritis Esophagitis Gastroparesis    Medication List    STOP taking these medications       atorvastatin 80 MG tablet  Commonly known as:  LIPITOR     ENSURE NUTRITION SHAKE PO     feeding supplement Liqd     HYDROcodone-acetaminophen 10-325 MG per tablet  Commonly known as:  NORCO     omeprazole 40 MG capsule  Commonly known as:  PRILOSEC  Replaced by:  pantoprazole 40 MG tablet     promethazine 12.5 MG tablet  Commonly known as:  PHENERGAN     TOVIAZ 8 MG Tb24  Generic drug:  fesoterodine      TAKE these medications       albuterol 108 (90 BASE) MCG/ACT inhaler  Commonly known as:  PROVENTIL HFA;VENTOLIN HFA  Inhale 2 puffs into the lungs every 6 (six) hours as needed for wheezing.     ALPRAZolam 0.5 MG tablet  Commonly known as:  XANAX  Take 0.5 mg by mouth 3 (three) times daily as needed for sleep.     aspirin 81 MG chewable tablet  Chew 81 mg by mouth daily.     calcitRIOL 0.25 MCG capsule  Commonly known as:  ROCALTROL  Take 0.25 mcg by mouth daily.     digoxin 0.125 MG tablet  Commonly known as:  LANOXIN  Take 1 tablet (0.125 mg total) by mouth daily.     diltiazem 120 MG tablet  Commonly known as:  CARDIZEM  Take 1 tablet (120 mg total) by mouth daily.     fluticasone 50 MCG/ACT nasal spray  Commonly known as:  FLONASE  Place 2 sprays into  the nose daily.     Fluticasone-Salmeterol 250-50 MCG/DOSE Aepb  Commonly known as:  ADVAIR DISKUS  Inhale 1 puff into the lungs 2 (two) times daily.     gabapentin 100 MG capsule  Commonly known as:  NEURONTIN  Take 100 mg by mouth 3 (three) times daily.     HYDROcodone-acetaminophen 5-325 MG per tablet  Commonly known as:  NORCO  Take 1 tablet by mouth every 6 (six) hours as needed for pain.     HYDROcodone-acetaminophen 5-325 MG per tablet  Commonly known as:  NORCO/VICODIN  Take 1 tablet by mouth every 6 (six) hours as needed.     insulin aspart 100 UNIT/ML injection  Commonly known as:  novoLOG  Inject 3-15 Units into the skin 3 (three) times daily before meals. 121-150=3 units, 151-200=4 units, 201-250=7 units, 251-300=9 units, 301-350=12 units, 351-400=15 units.     insulin detemir 100 UNIT/ML injection  Commonly known as:  LEVEMIR  Inject 10 Units into the skin at bedtime.     ipratropium 0.02 % nebulizer solution  Commonly known as:  ATROVENT  Take 2.5 mLs (500 mcg total) by nebulization 4 (four) times daily.     levalbuterol 0.63 MG/3ML nebulizer solution  Commonly known as:  XOPENEX  Take 3 mLs (0.63 mg total) by nebulization every 4 (four) hours as needed for wheezing.     levothyroxine 200 MCG tablet  Commonly known as:  SYNTHROID, LEVOTHROID  Take 200 mcg by mouth daily before breakfast.     metoCLOPramide 10 MG tablet  Commonly known as:  REGLAN  Take 1 tablet (10 mg total) by mouth 4 (four) times daily -  before meals and at bedtime.     ondansetron 4 MG tablet  Commonly known as:  ZOFRAN  Take 1 tablet (4 mg total) by mouth every 6 (six) hours as needed for nausea.     pantoprazole 40 MG tablet  Commonly known as:  PROTONIX  Take 1 tablet (40 mg total) by mouth 2 (two) times daily before a meal.     polyethylene glycol packet  Commonly known as:  MIRALAX / GLYCOLAX  Take 17 g by mouth daily.     predniSONE 5 MG tablet  Commonly known as:   DELTASONE  Take 2 tablets (10 mg total) by mouth daily.     rosuvastatin 40 MG tablet  Commonly known as:  CRESTOR  Take 1 tablet (40 mg total) by mouth daily.     sertraline 50 MG tablet  Commonly known as:  ZOLOFT  Take 1 tablet (50 mg total) by mouth daily.     tolterodine 4 MG 24 hr capsule  Commonly known as:  DETROL LA  Take 1 capsule (4 mg total) by mouth daily.     torsemide 20 MG tablet  Commonly known as:  DEMADEX  Take 1 tablet (20 mg total) by mouth 2 (two) times daily.     vitamin B-12 1000 MCG tablet  Commonly known as:  CYANOCOBALAMIN  Take 1,000 mcg by mouth daily.        Discharged Condition: Improved    Consults: Gastroenterology  Significant Diagnostic Studies: Ct Abdomen Pelvis Wo Contrast  09/14/2012  *RADIOLOGY REPORT*  Clinical Data: Left lower quadrant abdominal pain.  CT ABDOMEN AND PELVIS WITHOUT CONTRAST  Technique:  Multidetector CT imaging of the abdomen and pelvis was performed following the standard protocol without intravenous contrast.  Comparison: CT of the abdomen and pelvis 09/10/2012.  Findings:  Lung Bases: Linear opacities in the lower lobes of the lungs bilaterally, slightly increased compared to the prior examination, favored to represent a combination of atelectasis and/or scarring. Atherosclerotic calcifications within the left anterior descending and left circumflex coronary arteries.  Abdomen/Pelvis:  Severely decreased attenuation throughout the hepatic parenchyma, compatible with hepatic steatosis.  No focal hepatic lesions.  Status post cholecystectomy.  The unenhanced appearance of the pancreas, spleen, bilateral adrenal glands and the right kidney is unremarkable.  There is mild left hydroureteronephrosis which is new compared to the prior examination.  No definite stone is identified along the course of the left ureter, and no definite obstructing retroperitoneal mass is noted.  The urinary bladder is completely decompressed with a  Foley balloon catheter in place.  No significant volume of ascites.  No pneumoperitoneum.  No pathologic distension of small bowel.  Numerous colonic diverticula are noted, without surrounding inflammatory changes to suggest acute diverticulitis at this time.  Status post total abdominal hysterectomy.  Ovaries are atrophic.  Musculoskeletal:  There are no aggressive appearing lytic or blastic lesions noted in the visualized portions of the skeleton. Healing fracture in the posterolateral aspect of the left eleventh rib again noted.  IMPRESSION: 1.  Colonic diverticulosis without evidence to suggest  recurrent acute diverticulitis at this time. 2.  Interval development of mild left-sided hydroureteronephrosis. This is of uncertain etiology and significance as there is no definite ureteral stone or obstructing retroperitoneal mass. Additionally, the urinary bladder is completely decompressed with a Foley balloon catheter in place. 3.  Severe hepatic steatosis. 4.  Status post cholecystectomy and hysterectomy. 5.  Atherosclerosis, including two-vessel coronary artery disease. Assessment for potential risk factor modification, dietary therapy or pharmacologic therapy may be warranted, if clinically indicated.  6.  Additional incidental findings, as above.   Original Report Authenticated By: Trudie Reed, M.D.    US Abdomen Complete  08/28/2012  *RADIOLOGY REPORT*  Clinical Data:  Elevated liver function tests.  ABDOMINAL ULTRASOUND COMPLETE  Comparison:  CT on 08/08/2012  Findings:  Gallbladder:  Surgically absent.  Common Bile Duct:  Within normal limits in caliber. Measures 7 mm in diameter.  Liver: Diffusely increased parenchymal echogenicity, consistent with hepatic steatosis.  No focal liver mass identified.  IVC:  Appears normal.  Pancreas:  No abnormality identified.  Spleen:  Within normal limits in size and echotexture.  Right kidney:  Normal in size and parenchymal echogenicity.  No evidence of mass or  hydronephrosis.  Left kidney:  Suboptimal visualization due to large habitus and patient positioning.Normal in size and parenchymal echogenicity. No evidence of mass or hydronephrosis.  Abdominal Aorta:  No aneurysm identified.  IMPRESSION:  1.  Prior cholecystectomy.  No evidence of biliary ductal dilatation. 2.  Hepatic steatosis.   Original Report Authenticated By: Myles Rosenthal, M.D.    Ct Abdomen Pelvis W Contrast  09/10/2012  *RADIOLOGY REPORT*  Clinical Data: Right upper quadrant pain.  Nausea and vomiting. Elevated liver function tests.  CT ABDOMEN AND PELVIS WITH CONTRAST  Technique:  Multidetector CT imaging of the abdomen and pelvis was performed following the standard protocol during bolus administration of intravenous contrast.  Contrast: 80mL OMNIPAQUE IOHEXOL 300 MG/ML  SOLN  Comparison: 08/08/2012  Findings: Increased severe hepatic steatosis is seen since recent exam, consistent with acute steatohepatitis.  There is a focal fatty alteration again seen in the left hepatic lobe, but no liver masses are identified. No gross morphologic signs of hepatic cirrhosis identified.  Prior cholecystectomy noted, however there is no evidence of biliary ductal dilatation.  The pancreas, spleen, adrenal glands, and kidneys are normal in appearance.  No evidence of hydronephrosis.  No soft tissue masses or lymphadenopathy seen elsewhere within the abdomen or pelvis.  A Foley catheter is seen within the bladder which is empty. Diverticulosis is again seen involving the sigmoid colon, and there is been near complete resolution of the sigmoid diverticulitis since prior exam.  No other inflammatory process or abscess identified.  No evidence of free fluid or dilated bowel loops. Hand injection granulomas again seen in the subcutaneous fat of the anterior abdominal wall.  IMPRESSION:  1.  Increased severe hepatic steatosis, consistent with acute steatohepatitis. 2.  Near complete resolution of sigmoid diverticulitis  since previous exam.  No evidence of abscess or other complication.   Original Report Authenticated By: Myles Rosenthal, M.D.    Dg Chest Port 1 View  09/14/2012  *RADIOLOGY REPORT*  Clinical Data: Cough, congestion, shortness of breath, history COPD, vomiting, GERD, diabetes, coronary artery disease  PORTABLE CHEST - 1 VIEW  Comparison: Portable exam 1433 hours compared to 08/10/2012  Findings: Borderline enlargement of cardiac silhouette. Left arm PICC line stable tip projecting over SVC. Mediastinal contours and pulmonary vascularity normal for lordotic positioning. Question minimal  bibasilar atelectasis. Remaining lungs clear. Underlying emphysematous changes. No pleural effusion or pneumothorax. Prior cervical spine fusion.  IMPRESSION: Question minimal bibasilar atelectasis. Otherwise negative exam.   Original Report Authenticated By: Ulyses Southward, M.D.     Lab Results: Basic Metabolic Panel:  Recent Labs  09/81/19 1125 09/21/12 0455  NA 143 143  K 2.4* 2.6*  CL 101 101  CO2 32 34*  GLUCOSE 196* 163*  BUN 10 11  CREATININE 0.92 0.87  CALCIUM 7.4* 7.9*   Liver Function Tests: No results found for this basename: AST, ALT, ALKPHOS, BILITOT, PROT, ALBUMIN,  in the last 72 hours   CBC:  Recent Labs  09/21/12 0455  WBC 8.8  HGB 9.7*  HCT 29.3*  MCV 101.7*  PLT 323    Recent Results (from the past 240 hour(s))  URINE CULTURE     Status: None   Collection Time    09/14/12  1:30 PM      Result Value Range Status   Specimen Description URINE, CLEAN CATCH   Final   Special Requests NONE   Final   Culture  Setup Time 09/15/2012 01:59   Final   Colony Count >=100,000 COLONIES/ML   Final   Culture ENTEROCOCCUS SPECIES   Final   Report Status 09/18/2012 FINAL   Final   Organism ID, Bacteria ENTEROCOCCUS SPECIES   Final  MRSA PCR SCREENING     Status: Abnormal   Collection Time    09/15/12  1:49 AM      Result Value Range Status   MRSA by PCR POSITIVE (*) NEGATIVE Final    Comment:            The GeneXpert MRSA Assay (FDA     approved for NASAL specimens     only), is one component of a     comprehensive MRSA colonization     surveillance program. It is not     intended to diagnose MRSA     infection nor to guide or     monitor treatment for     MRSA infections.     RESULT CALLED TO, READ BACK BY AND VERIFIED WITH:     WEBB C AT 0350 ON [id number] BY [facility] Course: She was admitted with intractable nausea and vomiting. This has been going on for about 10 days. She has been in a skilled care facility had GI consultation while she was there. She had CT and was being set up for other treatments but continued having trouble with nausea and vomiting. She was given IV fluids medications for nausea and vomiting and GI consult was obtained again. She underwent EGD and was found to have gastritis and esophagitis. There was some question about gastroparesis but she was unable to do a gastric emptying study. She was placed on double dose pantoprazole started on Reglan intravenously and her treatment for adrenal insufficiency with increased and she was given IV steroids. She was placed on 6 small meals daily. After all this was done she showed remarkable improvement. She felt better was able to keep her food down. She and her family elected to go home rather than back to the skilled care facility.  Discharge Exam: Blood pressure 146/83, pulse 90, temperature 97 F (36.1 C), temperature source Oral, resp. rate 24, height 5\' 4"  (1.626 m), weight 128.776 kg (283 lb 14.4 oz), SpO2 95.00%. She is morbidly obese. Her chest is clear. Her abdomen is soft without masses or  tenderness extremity showed chronic venous stasis and chronic edema  Disposition: Home with home health services      Discharge Orders   Future Orders Complete By Expires     Discharge patient  As directed     Face-to-face encounter (required for Medicare/Medicaid patients)  As directed      Comments:      I Arul Farabee L certify that this patient is under my care and that I, or a nurse practitioner or physician's assistant working with me, had a face-to-face encounter that meets the physician face-to-face encounter requirements with this patient on 09/22/2012. The encounter with the patient was in whole, or in part for the following medical condition(s) which is the primary reason for home health care (List medical condition): gastroparesis/intractable nausea    Questions:      The encounter with the patient was in whole, or in part, for the following medical condition, which is the primary reason for home health care:  gastrioparesis/ intractable nausea    I certify that, based on my findings, the following services are medically necessary home health services:  Nursing    Physical therapy    My clinical findings support the need for the above services:  Shortness of breath with activity    Further, I certify that my clinical findings support that this patient is homebound due to:  Shortness of Breath with activity    Reason for Medically Necessary Home Health Services:  Skilled Nursing- Change/Decline in Patient Status    Home Health  As directed     Questions:      To provide the following care/treatments:  PT    RN    PICC line removal  As directed        Follow-up Information   Follow up with [facility]. (to see at home 1-2 days after D/C)    Contact information:   [address] [place] [address] [phone]      Signed: Fredirick Maudlin Pager [phone]  09/23/2012, 8:35 AM

## 2012-09-24 DIAGNOSIS — I8000 Phlebitis and thrombophlebitis of superficial vessels of unspecified lower extremity: Secondary | ICD-10-CM | POA: Diagnosis not present

## 2012-09-24 DIAGNOSIS — I4891 Unspecified atrial fibrillation: Secondary | ICD-10-CM | POA: Diagnosis not present

## 2012-09-24 DIAGNOSIS — M159 Polyosteoarthritis, unspecified: Secondary | ICD-10-CM | POA: Diagnosis not present

## 2012-09-24 DIAGNOSIS — J449 Chronic obstructive pulmonary disease, unspecified: Secondary | ICD-10-CM | POA: Diagnosis not present

## 2012-09-24 DIAGNOSIS — I2510 Atherosclerotic heart disease of native coronary artery without angina pectoris: Secondary | ICD-10-CM | POA: Diagnosis not present

## 2012-09-24 DIAGNOSIS — I5032 Chronic diastolic (congestive) heart failure: Secondary | ICD-10-CM | POA: Diagnosis not present

## 2012-09-24 DIAGNOSIS — K219 Gastro-esophageal reflux disease without esophagitis: Secondary | ICD-10-CM | POA: Diagnosis not present

## 2012-09-24 DIAGNOSIS — Z794 Long term (current) use of insulin: Secondary | ICD-10-CM | POA: Diagnosis not present

## 2012-09-24 DIAGNOSIS — D649 Anemia, unspecified: Secondary | ICD-10-CM | POA: Diagnosis not present

## 2012-09-24 DIAGNOSIS — E119 Type 2 diabetes mellitus without complications: Secondary | ICD-10-CM | POA: Diagnosis not present

## 2012-09-24 DIAGNOSIS — K2900 Acute gastritis without bleeding: Secondary | ICD-10-CM | POA: Diagnosis not present

## 2012-09-24 DIAGNOSIS — I509 Heart failure, unspecified: Secondary | ICD-10-CM | POA: Diagnosis not present

## 2012-09-24 NOTE — Telephone Encounter (Signed)
[facility] is aware of OV on 6/5 at 1030 with SF and appt card was also mailed to home address

## 2012-09-28 ENCOUNTER — Encounter: Payer: Self-pay | Admitting: Gastroenterology

## 2012-09-28 DIAGNOSIS — I2510 Atherosclerotic heart disease of native coronary artery without angina pectoris: Secondary | ICD-10-CM | POA: Diagnosis not present

## 2012-09-28 DIAGNOSIS — K219 Gastro-esophageal reflux disease without esophagitis: Secondary | ICD-10-CM | POA: Diagnosis not present

## 2012-09-28 DIAGNOSIS — N390 Urinary tract infection, site not specified: Secondary | ICD-10-CM | POA: Diagnosis not present

## 2012-09-28 DIAGNOSIS — M159 Polyosteoarthritis, unspecified: Secondary | ICD-10-CM | POA: Diagnosis not present

## 2012-09-28 DIAGNOSIS — K2900 Acute gastritis without bleeding: Secondary | ICD-10-CM | POA: Diagnosis not present

## 2012-09-28 DIAGNOSIS — I5032 Chronic diastolic (congestive) heart failure: Secondary | ICD-10-CM | POA: Diagnosis not present

## 2012-09-28 DIAGNOSIS — I509 Heart failure, unspecified: Secondary | ICD-10-CM | POA: Diagnosis not present

## 2012-09-29 DIAGNOSIS — K2900 Acute gastritis without bleeding: Secondary | ICD-10-CM | POA: Diagnosis not present

## 2012-09-29 DIAGNOSIS — I2510 Atherosclerotic heart disease of native coronary artery without angina pectoris: Secondary | ICD-10-CM | POA: Diagnosis not present

## 2012-09-29 DIAGNOSIS — K219 Gastro-esophageal reflux disease without esophagitis: Secondary | ICD-10-CM | POA: Diagnosis not present

## 2012-09-29 DIAGNOSIS — I509 Heart failure, unspecified: Secondary | ICD-10-CM | POA: Diagnosis not present

## 2012-09-29 DIAGNOSIS — I5032 Chronic diastolic (congestive) heart failure: Secondary | ICD-10-CM | POA: Diagnosis not present

## 2012-09-29 DIAGNOSIS — M159 Polyosteoarthritis, unspecified: Secondary | ICD-10-CM | POA: Diagnosis not present

## 2012-09-30 DIAGNOSIS — K219 Gastro-esophageal reflux disease without esophagitis: Secondary | ICD-10-CM | POA: Diagnosis not present

## 2012-09-30 DIAGNOSIS — I2510 Atherosclerotic heart disease of native coronary artery without angina pectoris: Secondary | ICD-10-CM | POA: Diagnosis not present

## 2012-09-30 DIAGNOSIS — K2900 Acute gastritis without bleeding: Secondary | ICD-10-CM | POA: Diagnosis not present

## 2012-09-30 DIAGNOSIS — M159 Polyosteoarthritis, unspecified: Secondary | ICD-10-CM | POA: Diagnosis not present

## 2012-09-30 DIAGNOSIS — I5032 Chronic diastolic (congestive) heart failure: Secondary | ICD-10-CM | POA: Diagnosis not present

## 2012-09-30 DIAGNOSIS — I509 Heart failure, unspecified: Secondary | ICD-10-CM | POA: Diagnosis not present

## 2012-10-01 DIAGNOSIS — K219 Gastro-esophageal reflux disease without esophagitis: Secondary | ICD-10-CM | POA: Diagnosis not present

## 2012-10-01 DIAGNOSIS — M159 Polyosteoarthritis, unspecified: Secondary | ICD-10-CM | POA: Diagnosis not present

## 2012-10-01 DIAGNOSIS — K2900 Acute gastritis without bleeding: Secondary | ICD-10-CM | POA: Diagnosis not present

## 2012-10-01 DIAGNOSIS — I2510 Atherosclerotic heart disease of native coronary artery without angina pectoris: Secondary | ICD-10-CM | POA: Diagnosis not present

## 2012-10-01 DIAGNOSIS — I509 Heart failure, unspecified: Secondary | ICD-10-CM | POA: Diagnosis not present

## 2012-10-01 DIAGNOSIS — I5032 Chronic diastolic (congestive) heart failure: Secondary | ICD-10-CM | POA: Diagnosis not present

## 2012-10-02 DIAGNOSIS — K219 Gastro-esophageal reflux disease without esophagitis: Secondary | ICD-10-CM | POA: Diagnosis not present

## 2012-10-02 DIAGNOSIS — K2900 Acute gastritis without bleeding: Secondary | ICD-10-CM | POA: Diagnosis not present

## 2012-10-02 DIAGNOSIS — I509 Heart failure, unspecified: Secondary | ICD-10-CM | POA: Diagnosis not present

## 2012-10-02 DIAGNOSIS — M159 Polyosteoarthritis, unspecified: Secondary | ICD-10-CM | POA: Diagnosis not present

## 2012-10-02 DIAGNOSIS — I2510 Atherosclerotic heart disease of native coronary artery without angina pectoris: Secondary | ICD-10-CM | POA: Diagnosis not present

## 2012-10-02 DIAGNOSIS — I5032 Chronic diastolic (congestive) heart failure: Secondary | ICD-10-CM | POA: Diagnosis not present

## 2012-10-05 DIAGNOSIS — I509 Heart failure, unspecified: Secondary | ICD-10-CM | POA: Diagnosis not present

## 2012-10-05 DIAGNOSIS — I5032 Chronic diastolic (congestive) heart failure: Secondary | ICD-10-CM | POA: Diagnosis not present

## 2012-10-05 DIAGNOSIS — M159 Polyosteoarthritis, unspecified: Secondary | ICD-10-CM | POA: Diagnosis not present

## 2012-10-05 DIAGNOSIS — K2900 Acute gastritis without bleeding: Secondary | ICD-10-CM | POA: Diagnosis not present

## 2012-10-05 DIAGNOSIS — K219 Gastro-esophageal reflux disease without esophagitis: Secondary | ICD-10-CM | POA: Diagnosis not present

## 2012-10-05 DIAGNOSIS — I2510 Atherosclerotic heart disease of native coronary artery without angina pectoris: Secondary | ICD-10-CM | POA: Diagnosis not present

## 2012-10-06 ENCOUNTER — Encounter (HOSPITAL_COMMUNITY): Payer: Self-pay

## 2012-10-06 ENCOUNTER — Emergency Department (HOSPITAL_COMMUNITY): Payer: Medicare Other

## 2012-10-06 ENCOUNTER — Inpatient Hospital Stay (HOSPITAL_COMMUNITY)
Admission: EM | Admit: 2012-10-06 | Discharge: 2012-10-15 | DRG: 579 | Disposition: A | Discharge status: Other Acute Inpatient Hospital | Payer: Medicare Other | Attending: Pulmonary Disease | Admitting: Pulmonary Disease

## 2012-10-06 DIAGNOSIS — L03113 Cellulitis of right upper limb: Secondary | ICD-10-CM | POA: Diagnosis present

## 2012-10-06 DIAGNOSIS — IMO0002 Reserved for concepts with insufficient information to code with codable children: Principal | ICD-10-CM | POA: Diagnosis present

## 2012-10-06 DIAGNOSIS — I5033 Acute on chronic diastolic (congestive) heart failure: Secondary | ICD-10-CM | POA: Diagnosis not present

## 2012-10-06 DIAGNOSIS — E876 Hypokalemia: Secondary | ICD-10-CM | POA: Diagnosis not present

## 2012-10-06 DIAGNOSIS — N390 Urinary tract infection, site not specified: Secondary | ICD-10-CM | POA: Diagnosis present

## 2012-10-06 DIAGNOSIS — Z794 Long term (current) use of insulin: Secondary | ICD-10-CM

## 2012-10-06 DIAGNOSIS — R079 Chest pain, unspecified: Secondary | ICD-10-CM | POA: Diagnosis not present

## 2012-10-06 DIAGNOSIS — I129 Hypertensive chronic kidney disease with stage 1 through stage 4 chronic kidney disease, or unspecified chronic kidney disease: Secondary | ICD-10-CM | POA: Diagnosis present

## 2012-10-06 DIAGNOSIS — E2740 Unspecified adrenocortical insufficiency: Secondary | ICD-10-CM | POA: Diagnosis present

## 2012-10-06 DIAGNOSIS — N179 Acute kidney failure, unspecified: Secondary | ICD-10-CM | POA: Diagnosis present

## 2012-10-06 DIAGNOSIS — J449 Chronic obstructive pulmonary disease, unspecified: Secondary | ICD-10-CM | POA: Diagnosis present

## 2012-10-06 DIAGNOSIS — Z9861 Coronary angioplasty status: Secondary | ICD-10-CM

## 2012-10-06 DIAGNOSIS — Z8249 Family history of ischemic heart disease and other diseases of the circulatory system: Secondary | ICD-10-CM

## 2012-10-06 DIAGNOSIS — L539 Erythematous condition, unspecified: Secondary | ICD-10-CM | POA: Diagnosis not present

## 2012-10-06 DIAGNOSIS — F411 Generalized anxiety disorder: Secondary | ICD-10-CM | POA: Diagnosis present

## 2012-10-06 DIAGNOSIS — I872 Venous insufficiency (chronic) (peripheral): Secondary | ICD-10-CM | POA: Diagnosis present

## 2012-10-06 DIAGNOSIS — I4891 Unspecified atrial fibrillation: Secondary | ICD-10-CM | POA: Diagnosis present

## 2012-10-06 DIAGNOSIS — Z87891 Personal history of nicotine dependence: Secondary | ICD-10-CM

## 2012-10-06 DIAGNOSIS — E785 Hyperlipidemia, unspecified: Secondary | ICD-10-CM | POA: Diagnosis present

## 2012-10-06 DIAGNOSIS — N183 Chronic kidney disease, stage 3 unspecified: Secondary | ICD-10-CM | POA: Diagnosis present

## 2012-10-06 DIAGNOSIS — G8929 Other chronic pain: Secondary | ICD-10-CM | POA: Diagnosis present

## 2012-10-06 DIAGNOSIS — I809 Phlebitis and thrombophlebitis of unspecified site: Secondary | ICD-10-CM | POA: Diagnosis not present

## 2012-10-06 DIAGNOSIS — J189 Pneumonia, unspecified organism: Secondary | ICD-10-CM | POA: Diagnosis not present

## 2012-10-06 DIAGNOSIS — E1165 Type 2 diabetes mellitus with hyperglycemia: Secondary | ICD-10-CM | POA: Diagnosis present

## 2012-10-06 DIAGNOSIS — Z86718 Personal history of other venous thrombosis and embolism: Secondary | ICD-10-CM | POA: Diagnosis not present

## 2012-10-06 DIAGNOSIS — I5032 Chronic diastolic (congestive) heart failure: Secondary | ICD-10-CM | POA: Diagnosis not present

## 2012-10-06 DIAGNOSIS — R509 Fever, unspecified: Secondary | ICD-10-CM

## 2012-10-06 DIAGNOSIS — M7989 Other specified soft tissue disorders: Secondary | ICD-10-CM | POA: Diagnosis not present

## 2012-10-06 DIAGNOSIS — Z2239 Carrier of other specified bacterial diseases: Secondary | ICD-10-CM

## 2012-10-06 DIAGNOSIS — I509 Heart failure, unspecified: Secondary | ICD-10-CM | POA: Diagnosis present

## 2012-10-06 DIAGNOSIS — M549 Dorsalgia, unspecified: Secondary | ICD-10-CM | POA: Diagnosis present

## 2012-10-06 DIAGNOSIS — Z1639 Resistance to other specified antimicrobial drug: Secondary | ICD-10-CM | POA: Diagnosis present

## 2012-10-06 DIAGNOSIS — E538 Deficiency of other specified B group vitamins: Secondary | ICD-10-CM | POA: Diagnosis present

## 2012-10-06 DIAGNOSIS — A4902 Methicillin resistant Staphylococcus aureus infection, unspecified site: Secondary | ICD-10-CM | POA: Diagnosis present

## 2012-10-06 DIAGNOSIS — Z7901 Long term (current) use of anticoagulants: Secondary | ICD-10-CM

## 2012-10-06 DIAGNOSIS — J984 Other disorders of lung: Secondary | ICD-10-CM | POA: Diagnosis not present

## 2012-10-06 DIAGNOSIS — I472 Ventricular tachycardia, unspecified: Secondary | ICD-10-CM | POA: Diagnosis present

## 2012-10-06 DIAGNOSIS — I2510 Atherosclerotic heart disease of native coronary artery without angina pectoris: Secondary | ICD-10-CM | POA: Diagnosis present

## 2012-10-06 DIAGNOSIS — K219 Gastro-esophageal reflux disease without esophagitis: Secondary | ICD-10-CM | POA: Diagnosis present

## 2012-10-06 DIAGNOSIS — I808 Phlebitis and thrombophlebitis of other sites: Secondary | ICD-10-CM | POA: Diagnosis not present

## 2012-10-06 DIAGNOSIS — L03119 Cellulitis of unspecified part of limb: Secondary | ICD-10-CM

## 2012-10-06 DIAGNOSIS — Z6841 Body Mass Index (BMI) 40.0 and over, adult: Secondary | ICD-10-CM | POA: Diagnosis not present

## 2012-10-06 DIAGNOSIS — N2581 Secondary hyperparathyroidism of renal origin: Secondary | ICD-10-CM | POA: Diagnosis present

## 2012-10-06 DIAGNOSIS — N170 Acute kidney failure with tubular necrosis: Secondary | ICD-10-CM | POA: Diagnosis not present

## 2012-10-06 DIAGNOSIS — E2749 Other adrenocortical insufficiency: Secondary | ICD-10-CM | POA: Diagnosis present

## 2012-10-06 DIAGNOSIS — T502X5A Adverse effect of carbonic-anhydrase inhibitors, benzothiadiazides and other diuretics, initial encounter: Secondary | ICD-10-CM | POA: Diagnosis present

## 2012-10-06 DIAGNOSIS — K2900 Acute gastritis without bleeding: Secondary | ICD-10-CM | POA: Diagnosis not present

## 2012-10-06 DIAGNOSIS — E1142 Type 2 diabetes mellitus with diabetic polyneuropathy: Secondary | ICD-10-CM | POA: Diagnosis present

## 2012-10-06 DIAGNOSIS — Z9981 Dependence on supplemental oxygen: Secondary | ICD-10-CM

## 2012-10-06 DIAGNOSIS — R600 Localized edema: Secondary | ICD-10-CM

## 2012-10-06 DIAGNOSIS — I4729 Other ventricular tachycardia: Secondary | ICD-10-CM | POA: Diagnosis present

## 2012-10-06 DIAGNOSIS — D649 Anemia, unspecified: Secondary | ICD-10-CM | POA: Diagnosis not present

## 2012-10-06 DIAGNOSIS — Z79899 Other long term (current) drug therapy: Secondary | ICD-10-CM

## 2012-10-06 DIAGNOSIS — N189 Chronic kidney disease, unspecified: Secondary | ICD-10-CM | POA: Diagnosis not present

## 2012-10-06 DIAGNOSIS — Z792 Long term (current) use of antibiotics: Secondary | ICD-10-CM

## 2012-10-06 DIAGNOSIS — R609 Edema, unspecified: Secondary | ICD-10-CM | POA: Diagnosis present

## 2012-10-06 DIAGNOSIS — I482 Chronic atrial fibrillation, unspecified: Secondary | ICD-10-CM | POA: Diagnosis present

## 2012-10-06 DIAGNOSIS — R0602 Shortness of breath: Secondary | ICD-10-CM | POA: Diagnosis not present

## 2012-10-06 DIAGNOSIS — E039 Hypothyroidism, unspecified: Secondary | ICD-10-CM | POA: Diagnosis present

## 2012-10-06 DIAGNOSIS — M159 Polyosteoarthritis, unspecified: Secondary | ICD-10-CM | POA: Diagnosis not present

## 2012-10-06 DIAGNOSIS — J9819 Other pulmonary collapse: Secondary | ICD-10-CM | POA: Diagnosis not present

## 2012-10-06 DIAGNOSIS — N19 Unspecified kidney failure: Secondary | ICD-10-CM | POA: Diagnosis not present

## 2012-10-06 DIAGNOSIS — E1149 Type 2 diabetes mellitus with other diabetic neurological complication: Secondary | ICD-10-CM | POA: Diagnosis present

## 2012-10-06 DIAGNOSIS — J4489 Other specified chronic obstructive pulmonary disease: Secondary | ICD-10-CM | POA: Diagnosis present

## 2012-10-06 LAB — URINALYSIS, ROUTINE W REFLEX MICROSCOPIC
Glucose, UA: NEGATIVE mg/dL
Nitrite: NEGATIVE
pH: 5.5 (ref 5.0–8.0)

## 2012-10-06 LAB — CBC WITH DIFFERENTIAL/PLATELET
Basophils Relative: 0 % (ref 0–1)
Eosinophils Absolute: 1.1 10*3/uL — ABNORMAL HIGH (ref 0.0–0.7)
Hemoglobin: 9.5 g/dL — ABNORMAL LOW (ref 12.0–15.0)
MCH: 32.5 pg (ref 26.0–34.0)
MCHC: 31.9 g/dL (ref 30.0–36.0)
Monocytes Relative: 9 % (ref 3–12)
Neutrophils Relative %: 67 % (ref 43–77)

## 2012-10-06 LAB — CREATININE, URINE, RANDOM: Creatinine, Urine: 242.13 mg/dL

## 2012-10-06 LAB — BASIC METABOLIC PANEL
BUN: 34 mg/dL — ABNORMAL HIGH (ref 6–23)
Creatinine, Ser: 4.04 mg/dL — ABNORMAL HIGH (ref 0.50–1.10)
GFR calc Af Amer: 12 mL/min — ABNORMAL LOW (ref >90)
GFR calc non Af Amer: 11 mL/min — ABNORMAL LOW (ref >90)
Potassium: 3.5 mEq/L (ref 3.5–5.1)

## 2012-10-06 LAB — GLUCOSE, CAPILLARY: Glucose-Capillary: 312 mg/dL — ABNORMAL HIGH (ref 70–99)

## 2012-10-06 LAB — PROTIME-INR: Prothrombin Time: 13.7 seconds (ref 11.6–15.2)

## 2012-10-06 LAB — SODIUM, URINE, RANDOM: Sodium, Ur: 26 mEq/L

## 2012-10-06 MED ORDER — SODIUM CHLORIDE 0.9 % IV SOLN: INTRAVENOUS | Status: AC

## 2012-10-06 MED ORDER — ENOXAPARIN SODIUM 150 MG/ML ~~LOC~~ SOLN
SUBCUTANEOUS | Status: AC
  Filled 2012-10-06: qty 1

## 2012-10-06 MED ORDER — DIGOXIN 125 MCG PO TABS
0.1250 mg | ORAL_TABLET | Freq: Every day | ORAL | Status: DC
  Administered 2012-10-06 – 2012-10-15 (×10): 0.125 mg via ORAL
  Filled 2012-10-06 (×12): qty 1

## 2012-10-06 MED ORDER — AZITHROMYCIN 250 MG PO TABS: 250.0000 mg | ORAL_TABLET | Freq: Every day | ORAL | Status: DC

## 2012-10-06 MED ORDER — ATORVASTATIN CALCIUM 40 MG PO TABS
80.0000 mg | ORAL_TABLET | Freq: Every day | ORAL | Status: DC
  Administered 2012-10-07 – 2012-10-14 (×8): 80 mg via ORAL
  Filled 2012-10-06: qty 1
  Filled 2012-10-06 (×3): qty 2
  Filled 2012-10-06: qty 1
  Filled 2012-10-06 (×5): qty 2

## 2012-10-06 MED ORDER — INSULIN ASPART 100 UNIT/ML ~~LOC~~ SOLN
0.0000 [IU] | Freq: Every day | SUBCUTANEOUS | Status: DC
  Administered 2012-10-06: 4 [IU] via SUBCUTANEOUS
  Administered 2012-10-07: 2 [IU] via SUBCUTANEOUS
  Administered 2012-10-08: 3 [IU] via SUBCUTANEOUS

## 2012-10-06 MED ORDER — VANCOMYCIN HCL IN DEXTROSE 1-5 GM/200ML-% IV SOLN
1000.0000 mg | Freq: Once | INTRAVENOUS | Status: AC
  Administered 2012-10-06: 1000 mg via INTRAVENOUS
  Filled 2012-10-06: qty 200

## 2012-10-06 MED ORDER — MOMETASONE FURO-FORMOTEROL FUM 100-5 MCG/ACT IN AERO
INHALATION_SPRAY | RESPIRATORY_TRACT | Status: AC
  Filled 2012-10-06: qty 8.8

## 2012-10-06 MED ORDER — SODIUM CHLORIDE 0.9 % IV SOLN
INTRAVENOUS | Status: DC
  Administered 2012-10-06: 17:00:00 via INTRAVENOUS

## 2012-10-06 MED ORDER — IPRATROPIUM BROMIDE 0.02 % IN SOLN
500.0000 ug | Freq: Four times a day (QID) | RESPIRATORY_TRACT | Status: DC
  Administered 2012-10-06 – 2012-10-15 (×33): 500 ug via RESPIRATORY_TRACT
  Filled 2012-10-06 (×33): qty 2.5

## 2012-10-06 MED ORDER — HEPARIN SODIUM (PORCINE) 5000 UNIT/ML IJ SOLN
5000.0000 [IU] | Freq: Three times a day (TID) | INTRAMUSCULAR | Status: DC
  Administered 2012-10-06 – 2012-10-15 (×27): 5000 [IU] via SUBCUTANEOUS
  Filled 2012-10-06: qty 1
  Filled 2012-10-06: qty 2
  Filled 2012-10-06 (×24): qty 1

## 2012-10-06 MED ORDER — PANTOPRAZOLE SODIUM 40 MG PO TBEC
40.0000 mg | DELAYED_RELEASE_TABLET | Freq: Two times a day (BID) | ORAL | Status: DC
  Administered 2012-10-07 – 2012-10-15 (×16): 40 mg via ORAL
  Filled 2012-10-06 (×17): qty 1

## 2012-10-06 MED ORDER — DILTIAZEM HCL 60 MG PO TABS: 120.0000 mg | ORAL_TABLET | Freq: Every day | ORAL | Status: DC

## 2012-10-06 MED ORDER — ASPIRIN 81 MG PO CHEW: 81.0000 mg | CHEWABLE_TABLET | Freq: Every day | ORAL | Status: DC

## 2012-10-06 MED ORDER — METHYLPREDNISOLONE SODIUM SUCC 125 MG IJ SOLR
125.0000 mg | Freq: Once | INTRAMUSCULAR | Status: AC
  Administered 2012-10-06: 125 mg via INTRAVENOUS
  Filled 2012-10-06: qty 2

## 2012-10-06 MED ORDER — PIPERACILLIN-TAZOBACTAM 3.375 G IVPB
3.3750 g | Freq: Once | INTRAVENOUS | Status: AC
  Administered 2012-10-06: 3.375 g via INTRAVENOUS
  Filled 2012-10-06: qty 50

## 2012-10-06 MED ORDER — METOCLOPRAMIDE HCL 10 MG PO TABS
10.0000 mg | ORAL_TABLET | Freq: Three times a day (TID) | ORAL | Status: DC
  Administered 2012-10-06 – 2012-10-15 (×33): 10 mg via ORAL
  Filled 2012-10-06 (×34): qty 1

## 2012-10-06 MED ORDER — SERTRALINE HCL 50 MG PO TABS
50.0000 mg | ORAL_TABLET | Freq: Every day | ORAL | Status: DC
  Administered 2012-10-07 – 2012-10-12 (×6): 50 mg via ORAL
  Filled 2012-10-06 (×8): qty 1

## 2012-10-06 MED ORDER — HYDROCODONE-ACETAMINOPHEN 5-325 MG PO TABS
1.0000 | ORAL_TABLET | Freq: Four times a day (QID) | ORAL | Status: DC | PRN
  Administered 2012-10-08 – 2012-10-15 (×9): 1 via ORAL
  Filled 2012-10-06 (×10): qty 1

## 2012-10-06 MED ORDER — SODIUM CHLORIDE 0.9 % IJ SOLN
3.0000 mL | Freq: Two times a day (BID) | INTRAMUSCULAR | Status: DC
  Administered 2012-10-07 – 2012-10-12 (×7): 3 mL via INTRAVENOUS

## 2012-10-06 MED ORDER — PREDNISONE 10 MG PO TABS: 10.0000 mg | ORAL_TABLET | Freq: Every day | ORAL | Status: DC

## 2012-10-06 MED ORDER — DILTIAZEM HCL 60 MG PO TABS
120.0000 mg | ORAL_TABLET | Freq: Every day | ORAL | Status: DC
  Administered 2012-10-08 – 2012-10-15 (×8): 120 mg via ORAL
  Filled 2012-10-06 (×3): qty 2
  Filled 2012-10-06: qty 1
  Filled 2012-10-06: qty 2
  Filled 2012-10-06: qty 1
  Filled 2012-10-06 (×4): qty 2

## 2012-10-06 MED ORDER — INSULIN ASPART 100 UNIT/ML ~~LOC~~ SOLN: 3.0000 [IU] | Freq: Three times a day (TID) | SUBCUTANEOUS | Status: DC

## 2012-10-06 MED ORDER — VANCOMYCIN HCL 10 G IV SOLR
1500.0000 mg | Freq: Once | INTRAVENOUS | Status: DC
  Filled 2012-10-06: qty 1500

## 2012-10-06 MED ORDER — IPRATROPIUM BROMIDE 0.02 % IN SOLN
0.5000 mg | Freq: Once | RESPIRATORY_TRACT | Status: AC
  Administered 2012-10-06: 0.5 mg via RESPIRATORY_TRACT
  Filled 2012-10-06: qty 2.5

## 2012-10-06 MED ORDER — ALBUTEROL SULFATE (5 MG/ML) 0.5% IN NEBU
2.5000 mg | INHALATION_SOLUTION | RESPIRATORY_TRACT | Status: DC
  Administered 2012-10-06 – 2012-10-15 (×48): 2.5 mg via RESPIRATORY_TRACT
  Filled 2012-10-06 (×48): qty 0.5

## 2012-10-06 MED ORDER — INSULIN ASPART 100 UNIT/ML ~~LOC~~ SOLN
0.0000 [IU] | Freq: Three times a day (TID) | SUBCUTANEOUS | Status: DC
  Administered 2012-10-07 (×3): 7 [IU] via SUBCUTANEOUS
  Administered 2012-10-08 (×3): 4 [IU] via SUBCUTANEOUS
  Administered 2012-10-09: 3 [IU] via SUBCUTANEOUS
  Administered 2012-10-09: 4 [IU] via SUBCUTANEOUS
  Administered 2012-10-09 – 2012-10-10 (×3): 3 [IU] via SUBCUTANEOUS
  Administered 2012-10-10: 4 [IU] via SUBCUTANEOUS
  Administered 2012-10-11: 3 [IU] via SUBCUTANEOUS
  Administered 2012-10-11 – 2012-10-12 (×2): 4 [IU] via SUBCUTANEOUS

## 2012-10-06 MED ORDER — FLUTICASONE PROPIONATE 50 MCG/ACT NA SUSP
2.0000 | Freq: Every day | NASAL | Status: DC
  Administered 2012-10-07 – 2012-10-15 (×8): 2 via NASAL
  Filled 2012-10-06: qty 16

## 2012-10-06 MED ORDER — POLYETHYLENE GLYCOL 3350 17 G PO PACK
17.0000 g | PACK | Freq: Every day | ORAL | Status: DC
  Administered 2012-10-09 – 2012-10-10 (×2): 17 g via ORAL
  Filled 2012-10-06 (×5): qty 1

## 2012-10-06 MED ORDER — PREDNISONE 10 MG PO TABS
10.0000 mg | ORAL_TABLET | Freq: Every day | ORAL | Status: DC
  Administered 2012-10-07: 10 mg via ORAL
  Filled 2012-10-06: qty 1

## 2012-10-06 MED ORDER — ASPIRIN 81 MG PO CHEW
81.0000 mg | CHEWABLE_TABLET | Freq: Every day | ORAL | Status: DC
  Administered 2012-10-07 – 2012-10-15 (×9): 81 mg via ORAL
  Filled 2012-10-06 (×9): qty 1

## 2012-10-06 MED ORDER — FESOTERODINE FUMARATE ER 8 MG PO TB24
8.0000 mg | ORAL_TABLET | Freq: Every day | ORAL | Status: DC
  Administered 2012-10-07 – 2012-10-15 (×9): 8 mg via ORAL
  Filled 2012-10-06 (×10): qty 1

## 2012-10-06 MED ORDER — INSULIN DETEMIR 100 UNIT/ML ~~LOC~~ SOLN
50.0000 [IU] | Freq: Every day | SUBCUTANEOUS | Status: DC
  Administered 2012-10-06 – 2012-10-08 (×3): 50 [IU] via SUBCUTANEOUS
  Filled 2012-10-06 (×3): qty 0.5

## 2012-10-06 MED ORDER — DEXTROSE 5 % IV SOLN: 1.0000 g | INTRAVENOUS | Status: DC

## 2012-10-06 MED ORDER — ONDANSETRON HCL 4 MG PO TABS: 4.0000 mg | ORAL_TABLET | Freq: Four times a day (QID) | ORAL | Status: DC | PRN

## 2012-10-06 MED ORDER — LEVOTHYROXINE SODIUM 100 MCG PO TABS
200.0000 ug | ORAL_TABLET | Freq: Every day | ORAL | Status: DC
  Administered 2012-10-07 – 2012-10-15 (×9): 200 ug via ORAL
  Filled 2012-10-06 (×4): qty 2
  Filled 2012-10-06 (×2): qty 1
  Filled 2012-10-06 (×6): qty 2

## 2012-10-06 MED ORDER — CALCITRIOL 0.25 MCG PO CAPS
0.2500 ug | ORAL_CAPSULE | Freq: Every day | ORAL | Status: DC
  Administered 2012-10-07 – 2012-10-15 (×9): 0.25 ug via ORAL
  Filled 2012-10-06 (×11): qty 1

## 2012-10-06 MED ORDER — MOMETASONE FURO-FORMOTEROL FUM 100-5 MCG/ACT IN AERO
2.0000 | INHALATION_SPRAY | Freq: Two times a day (BID) | RESPIRATORY_TRACT | Status: DC
  Administered 2012-10-06 – 2012-10-15 (×18): 2 via RESPIRATORY_TRACT
  Filled 2012-10-06: qty 8.8

## 2012-10-06 MED ORDER — AZITHROMYCIN 250 MG PO TABS
500.0000 mg | ORAL_TABLET | Freq: Every day | ORAL | Status: AC
  Administered 2012-10-06: 500 mg via ORAL
  Filled 2012-10-06: qty 2

## 2012-10-06 MED ORDER — VITAMIN B-12 1000 MCG PO TABS
1000.0000 ug | ORAL_TABLET | Freq: Every day | ORAL | Status: DC
  Administered 2012-10-07 – 2012-10-15 (×9): 1000 ug via ORAL
  Filled 2012-10-06 (×11): qty 1

## 2012-10-06 MED ORDER — INSULIN DETEMIR 100 UNIT/ML ~~LOC~~ SOLN
SUBCUTANEOUS | Status: AC
  Filled 2012-10-06: qty 1

## 2012-10-06 MED ORDER — ALBUTEROL (5 MG/ML) CONTINUOUS INHALATION SOLN
15.0000 mg/h | INHALATION_SOLUTION | Freq: Once | RESPIRATORY_TRACT | Status: AC
  Administered 2012-10-06: 15 mg/h via RESPIRATORY_TRACT
  Filled 2012-10-06: qty 20

## 2012-10-06 MED ORDER — GABAPENTIN 100 MG PO CAPS
100.0000 mg | ORAL_CAPSULE | Freq: Three times a day (TID) | ORAL | Status: DC
  Administered 2012-10-06 – 2012-10-15 (×25): 100 mg via ORAL
  Filled 2012-10-06 (×32): qty 1

## 2012-10-06 MED ORDER — ONDANSETRON HCL 4 MG/2ML IJ SOLN: 4.0000 mg | Freq: Four times a day (QID) | INTRAMUSCULAR | Status: DC | PRN

## 2012-10-06 MED ORDER — ALBUTEROL SULFATE HFA 108 (90 BASE) MCG/ACT IN AERS: 2.0000 | INHALATION_SPRAY | Freq: Four times a day (QID) | RESPIRATORY_TRACT | Status: DC | PRN

## 2012-10-06 MED ORDER — ALPRAZOLAM 0.5 MG PO TABS
0.5000 mg | ORAL_TABLET | Freq: Three times a day (TID) | ORAL | Status: DC | PRN
  Administered 2012-10-11 – 2012-10-15 (×5): 0.5 mg via ORAL
  Filled 2012-10-06 (×7): qty 1

## 2012-10-06 MED ORDER — ENOXAPARIN SODIUM 100 MG/ML ~~LOC~~ SOLN: 1.0000 mg/kg | Freq: Once | SUBCUTANEOUS | Status: DC

## 2012-10-06 NOTE — ED Notes (Addendum)
EMS reports pt has had fever and SOB since this am.  Also reports family said some AMS since waking up this am EMS reports initial 02 sat 88% on 4liters.  Reports is on 2 liters at home usually.  Increased to 4liters 2 weeks ago.

## 2012-10-06 NOTE — ED Notes (Signed)
Pt's family refused pt's PO meds at this time as they state she took her medicine in the morning. EDP notified.
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
## 2012-10-06 NOTE — ED Notes (Signed)
EDP aware of bp.  

## 2012-10-06 NOTE — ED Notes (Signed)
Pt called nurse to room to show me she had accidentally pulled her IV out. Site remains soft pink and no s/s of infiltration.

## 2012-10-06 NOTE — ED Notes (Signed)
Pt c/o rt arm pain with noted swelling noted and serous drainage noted. EDP and hospitalized aware

## 2012-10-06 NOTE — Progress Notes (Addendum)
Was called by the ER physician to admit the patient, patient apparently has cellulitis of the right arm, acute renal failure, she is hypotensive and hypoxic, her d-dimer was extremely elevated, right upper quadrant ultrasound was negative for DVT, ER physician has ordered a VQ scan which is pending, I have requested the ER physician to call us once the VQ scan results are back as that could dramatically change admission plan. ER physician informed me that she would be leaving as her shift is getting over, one of her partners will give [place] a call.
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
## 2012-10-07 ENCOUNTER — Inpatient Hospital Stay (HOSPITAL_COMMUNITY): Payer: Medicare Other

## 2012-10-07 DIAGNOSIS — E2749 Other adrenocortical insufficiency: Secondary | ICD-10-CM | POA: Diagnosis not present

## 2012-10-07 DIAGNOSIS — IMO0002 Reserved for concepts with insufficient information to code with codable children: Secondary | ICD-10-CM | POA: Diagnosis not present

## 2012-10-07 DIAGNOSIS — N19 Unspecified kidney failure: Secondary | ICD-10-CM | POA: Diagnosis not present

## 2012-10-07 DIAGNOSIS — N179 Acute kidney failure, unspecified: Secondary | ICD-10-CM | POA: Diagnosis not present

## 2012-10-07 DIAGNOSIS — D649 Anemia, unspecified: Secondary | ICD-10-CM | POA: Diagnosis not present

## 2012-10-07 LAB — GLUCOSE, CAPILLARY
Glucose-Capillary: 250 mg/dL — ABNORMAL HIGH (ref 70–99)
Glucose-Capillary: 247 mg/dL — ABNORMAL HIGH (ref 70–99)
Glucose-Capillary: 242 mg/dL — ABNORMAL HIGH (ref 70–99)

## 2012-10-07 LAB — CBC
Hemoglobin: 9.2 g/dL — ABNORMAL LOW (ref 12.0–15.0)
MCH: 31.9 pg (ref 26.0–34.0)
Platelets: 277 10*3/uL (ref 150–400)
RBC: 2.88 MIL/uL — ABNORMAL LOW (ref 3.87–5.11)
WBC: 7.1 10*3/uL (ref 4.0–10.5)

## 2012-10-07 LAB — COMPREHENSIVE METABOLIC PANEL
AST: 39 U/L — ABNORMAL HIGH (ref 0–37)
BUN: 37 mg/dL — ABNORMAL HIGH (ref 6–23)
CO2: 25 mEq/L (ref 19–32)
Calcium: 7.4 mg/dL — ABNORMAL LOW (ref 8.4–10.5)
Chloride: 102 mEq/L (ref 96–112)
Creatinine, Ser: 4.5 mg/dL — ABNORMAL HIGH (ref 0.50–1.10)
GFR calc non Af Amer: 9 mL/min — ABNORMAL LOW (ref >90)
Total Bilirubin: 0.4 mg/dL (ref 0.3–1.2)

## 2012-10-07 LAB — OSMOLALITY: Osmolality: 304 mOsm/kg — ABNORMAL HIGH (ref 275–300)

## 2012-10-07 MED ORDER — HYDROCORTISONE SOD SUCCINATE 100 MG IJ SOLR
50.0000 mg | Freq: Two times a day (BID) | INTRAMUSCULAR | Status: DC
  Administered 2012-10-07 – 2012-10-13 (×13): 50 mg via INTRAVENOUS
  Filled 2012-10-07 (×15): qty 2

## 2012-10-07 MED ORDER — VANCOMYCIN HCL 10 G IV SOLR
1500.0000 mg | Freq: Once | INTRAVENOUS | Status: AC
  Administered 2012-10-07: 1500 mg via INTRAVENOUS
  Filled 2012-10-07: qty 1500

## 2012-10-07 MED ORDER — SODIUM CHLORIDE 0.9 % IV SOLN
INTRAVENOUS | Status: DC
  Administered 2012-10-07 – 2012-10-08 (×3): via INTRAVENOUS

## 2012-10-07 MED ORDER — POTASSIUM CHLORIDE CRYS ER 20 MEQ PO TBCR
20.0000 meq | EXTENDED_RELEASE_TABLET | Freq: Two times a day (BID) | ORAL | Status: AC
  Administered 2012-10-07 – 2012-10-08 (×2): 20 meq via ORAL
  Filled 2012-10-07 (×2): qty 1

## 2012-10-07 MED ORDER — BECAPLERMIN 0.01 % EX GEL: Freq: Every day | CUTANEOUS | Status: DC

## 2012-10-07 NOTE — Progress Notes (Signed)
Patient BP low, Cardizem held. Dr. Juanetta Gosling aware. IV fluids discontinued. Dr. Juanetta Gosling notified. Orders to continue normal saline 150cc/hr.

## 2012-10-07 NOTE — Progress Notes (Signed)
UR chart review completed.  

## 2012-10-07 NOTE — Care Management Note (Signed)
    Page 1 of 2   10/15/2012     1:11:15 PM   CARE MANAGEMENT NOTE 10/15/2012  Patient:  PIERCE, BIAGINI   Account Number:  [id number]  Date Initiated:  10/07/2012  Documentation initiated by:  Sharrie Rothman  Subjective/Objective Assessment:   Pt admitted from home with cellulitis. Pt lives with her husband and will return home at discharge. Pt is active with [facility] for RN, aide, and PT. Pt has O2, walker, wheelchair, BSC, and hospital bed.     Action/Plan:   CM will arrange resumption of HH at discharge.   Anticipated DC Date:  10/12/2012   Anticipated DC Plan:  HOME W HOME HEALTH SERVICES      DC Planning Services  CM consult      [facility] Choice  LONG TERM ACUTE CARE   Choice offered to / List presented to:  C-1 Patient           Status of service:  Completed, signed off Medicare Important Message given?   (If response is "NO", the following Medicare IM given date fields will be blank) Date Medicare IM given:   Date Additional Medicare IM given:    Discharge Disposition:  LONG TERM ACUTE CARE (LTAC)  Per UR Regulation:  Reviewed for med. necessity/level of care/duration of stay  If discussed at Long Length of Stay Meetings, dates discussed:   10/15/2012    Comments:  10/15/12 1220 Anibal Henderson RN Call received from [facility] with all needed information for transfer. Pt going to [id number], with Dr Angelina Ok attending. All information given to [facility], RN, and she will call for [facility] when patient's spouse returns from lunch. Notified pt that all is set for transfer, once spouse returns. She is appreciative of information. 10/15/12 [phone] Anibal Henderson RN Spoke with Ceasar Lund with [facility]. She saw pt and spouse last PM and offered a bed and they accepted. Bed available today. Spoke with pt and spouse in room and they agree that they want to go to [facility]. Spoke with Dr Juanetta Gosling, in ICU, and he is doing orders, [facility] and D/C summary. Spoke with Elita Quick, primary RN and made her  aware of upcoming transfer. Returned call to Darl Pikes, at [facility], to notify her that pt will be transferring today, and she will call with room number and accepting MD name within 2-3 hours. 10/14/12 1500 Anibal Henderson RN Per Dr Juanetta Gosling, pt will need about another week of diuresis to remove the edema/ fluid, and he feels pt may be LTAC appropriate. Spoke with pt and spouse after she returned from Port-a-cath placement and they agree that she could use some rehab and understand the reasons for LTAC and agree to have pt go there to continue her care for another 1-2 weeks. Referred to [facility] and [facility]. 10/07/12 1615 Arlyss Queen, RN BSN CM
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
## 2012-10-07 NOTE — Progress Notes (Signed)
Dr Wolfgang Phoenix in to see patient, requested me to call Dr. Juanetta Gosling to increase steroids due to hypotensive and renal failure. Dr. Juanetta Gosling notified. New orders for Solu-cortef 50mg  BID and continue Prednisone 10mg  daily.

## 2012-10-07 NOTE — Progress Notes (Signed)
Inpatient Diabetes Program Recommendations  AACE/ADA: New Consensus Statement on Inpatient Glycemic Control (2013)  Target Ranges:  Prepandial:   less than 140 mg/dL      Peak postprandial:   less than 180 mg/dL (1-2 hours)      Critically ill patients:  140 - 180 mg/dL   Results for Ann [name], Ann [name] (MRN [id number]) as of 10/07/2012 08:37  Ref. Range 10/06/2012 21:53 10/07/2012 07:44  Glucose-Capillary Latest Range: 70-99 mg/dL 409 (H) 811 (H)    Inpatient Diabetes Program Recommendations Insulin - Basal: Please consider increasing Levemir to 55 units QHS.  Note: Patient received Levemir 50 units last night at bedtime.  Please consider increasing Levemir to 55 units QHS to improve glycemic control.    Thanks, Orlando Penner, RN, BSN, CCRN Diabetes Coordinator Inpatient Diabetes Program [phone]
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
## 2012-10-08 DIAGNOSIS — N179 Acute kidney failure, unspecified: Secondary | ICD-10-CM | POA: Diagnosis not present

## 2012-10-08 DIAGNOSIS — IMO0002 Reserved for concepts with insufficient information to code with codable children: Secondary | ICD-10-CM | POA: Diagnosis not present

## 2012-10-08 DIAGNOSIS — I808 Phlebitis and thrombophlebitis of other sites: Secondary | ICD-10-CM | POA: Diagnosis not present

## 2012-10-08 DIAGNOSIS — D649 Anemia, unspecified: Secondary | ICD-10-CM | POA: Diagnosis not present

## 2012-10-08 DIAGNOSIS — E2749 Other adrenocortical insufficiency: Secondary | ICD-10-CM | POA: Diagnosis not present

## 2012-10-08 LAB — BASIC METABOLIC PANEL
Calcium: 7.2 mg/dL — ABNORMAL LOW (ref 8.4–10.5)
Creatinine, Ser: 4.33 mg/dL — ABNORMAL HIGH (ref 0.50–1.10)
GFR calc Af Amer: 11 mL/min — ABNORMAL LOW (ref >90)
GFR calc non Af Amer: 10 mL/min — ABNORMAL LOW (ref >90)
Sodium: 140 mEq/L (ref 135–145)

## 2012-10-08 LAB — GLUCOSE, CAPILLARY

## 2012-10-08 MED ORDER — FUROSEMIDE 10 MG/ML IJ SOLN
40.0000 mg | Freq: Two times a day (BID) | INTRAMUSCULAR | Status: DC
  Administered 2012-10-08 – 2012-10-11 (×7): 40 mg via INTRAVENOUS
  Filled 2012-10-08 (×8): qty 4

## 2012-10-08 MED ORDER — POTASSIUM CHLORIDE IN NACL 20-0.9 MEQ/L-% IV SOLN
INTRAVENOUS | Status: DC
  Administered 2012-10-08 – 2012-10-09 (×6): via INTRAVENOUS
  Administered 2012-10-10: 10 mL/h via INTRAVENOUS
  Administered 2012-10-12: 22:00:00 via INTRAVENOUS

## 2012-10-08 NOTE — Progress Notes (Signed)
  Subjective: No significant change.    Objective: Vital signs in last 24 hours: Temp:  [97.9 F (36.6 C)] 97.9 F (36.6 C) (05/15 1520) Pulse Rate:  [79-93] 93 (05/15 1520) Resp:  [20] 20 (05/15 1520) BP: (97-112)/(54-63) 97/54 mmHg (05/15 1520) SpO2:  [92 %-96 %] 92 % (05/15 2014) Last BM Date: 10/07/12  Intake/Output from previous day: 05/14 0701 - 05/15 0700 In: 360 [P.O.:360] Out: -  Intake/Output this shift: Total I/O In: 867.5 [I.V.:867.5] Out: -   General appearance: alert and no distress Skin: Erythema right arm.    Lab Results:   Recent Labs  10/06/12 1205 10/07/12 0457  WBC 9.4 7.1  HGB 9.5* 9.2*  HCT 29.8* 29.0*  PLT 296 277   BMET  Recent Labs  10/07/12 0457 10/08/12 0448  NA 140 140  K 3.3* 3.5  CL 102 103  CO2 25 23  GLUCOSE 266* 201*  BUN 37* 39*  CREATININE 4.50* 4.33*  CALCIUM 7.4* 7.2*   PT/INR  Recent Labs  10/06/12 1205  LABPROT 13.7  INR 1.06   ABG No results found for this basename: PHART, PCO2, PO2, HCO3,  in the last 72 hours  Studies/Results: US Renal  10/07/2012   *RADIOLOGY REPORT*  Clinical Data: Renal failure.  RENAL/URINARY TRACT ULTRASOUND COMPLETE  Comparison:  Renal ultrasound dated 06/14/2012  Findings:  Right Kidney:  11.9 cm in length.  Normal in appearance.  Left Kidney:  11.8 cm in length.  Normal in appearance.  Bladder:  The bladder is empty with a Foley catheter in place.  IMPRESSION: Normal bilateral renal ultrasound.   Original Report Authenticated By: Francene Boyers, M.D.    Anti-infectives: Anti-infectives   Start     Dose/Rate Route Frequency Ordered Stop   10/07/12 1200  vancomycin (VANCOCIN) 1,500 mg in sodium chloride 0.9 % 500 mL IVPB     1,500 mg 250 mL/hr over 120 Minutes Intravenous  Once 10/07/12 1036 10/07/12 1411   10/07/12 1000  azithromycin (ZITHROMAX) tablet 250 mg  Status:  Discontinued     250 mg Oral Daily 10/06/12 1626 10/06/12 1757   10/06/12 1930  vancomycin (VANCOCIN) 1,500  mg in sodium chloride 0.9 % 500 mL IVPB  Status:  Discontinued     1,500 mg 250 mL/hr over 120 Minutes Intravenous  Once 10/06/12 1837 10/08/12 0859   10/06/12 1800  cefTRIAXone (ROCEPHIN) 1 g in dextrose 5 % 50 mL IVPB  Status:  Discontinued     1 g 100 mL/hr over 30 Minutes Intravenous Every 24 hours 10/06/12 1757 10/06/12 1803   10/06/12 1630  azithromycin (ZITHROMAX) tablet 500 mg     500 mg Oral Daily 10/06/12 1626 10/06/12 1742   10/06/12 1600  vancomycin (VANCOCIN) IVPB 1000 mg/200 mL premix     1,000 mg 200 mL/hr over 60 Minutes Intravenous  Once 10/06/12 1551 10/06/12 1830   10/06/12 1600  piperacillin-tazobactam (ZOSYN) IVPB 3.375 g     3.375 g 12.5 mL/hr over 240 Minutes Intravenous  Once 10/06/12 1551 10/06/12 1835      Assessment/Plan: s/p * No surgery found * Renal function about the same.  Will see what AM labs look like but doubt we will be able to proceed with port tomorrow.    LOS: 2 days    Alta Shober C 10/08/2012

## 2012-10-08 NOTE — Progress Notes (Signed)
Subjective: She is overall about the same. She continues to have some pain in her right arm. Her blood pressure is better on IV steroids  Objective: Vital signs in last 24 hours: Temp:  [97.8 F (36.6 C)-97.9 F (36.6 C)] 97.9 F (36.6 C) (05/15 0500) Pulse Rate:  [79-122] 79 (05/15 0500) Resp:  [20] 20 (05/15 0500) BP: (101-112)/(58-63) 101/63 mmHg (05/15 0500) SpO2:  [80 %-96 %] 96 % (05/15 0842) Weight change:  Last BM Date: 10/07/12  Intake/Output from previous day: 05/14 0701 - 05/15 0700 In: 360 [P.O.:360] Out: -   PHYSICAL EXAM General appearance: alert, cooperative, mild distress and morbidly obese Resp: clear to auscultation bilaterally Cardio: irregularly irregular rhythm GI: soft, non-tender; bowel sounds normal; no masses,  no organomegaly Extremities: Still some erythema and edema of her right forearm  Lab Results:    Basic Metabolic Panel:  Recent Labs  16/10/96 0457 10/08/12 0448  NA 140 140  K 3.3* 3.5  CL 102 103  CO2 25 23  GLUCOSE 266* 201*  BUN 37* 39*  CREATININE 4.50* 4.33*  CALCIUM 7.4* 7.2*   Liver Function Tests:  Recent Labs  10/07/12 0457  AST 39*  ALT 20  ALKPHOS 193*  BILITOT 0.4  PROT 5.9*  ALBUMIN 1.8*   No results found for this basename: LIPASE, AMYLASE,  in the last 72 hours No results found for this basename: AMMONIA,  in the last 72 hours CBC:  Recent Labs  10/06/12 1205 10/07/12 0457  WBC 9.4 7.1  NEUTROABS 6.3  --   HGB 9.5* 9.2*  HCT 29.8* 29.0*  MCV 102.1* 100.7*  PLT 296 277   Cardiac Enzymes:  Recent Labs  10/06/12 1205  TROPONINI <0.30   BNP:  Recent Labs  10/06/12 1205  PROBNP 940.3*   D-Dimer:  Recent Labs  10/06/12 1205  DDIMER 4.94*   CBG:  Recent Labs  10/06/12 2153 10/07/12 0744 10/07/12 1141 10/07/12 1707 10/07/12 2208  GLUCAP 312* 250* 247* 242* 207*   Hemoglobin A1C: No results found for this basename: HGBA1C,  in the last 72 hours Fasting Lipid Panel: No  results found for this basename: CHOL, HDL, LDLCALC, TRIG, CHOLHDL, LDLDIRECT,  in the last 72 hours Thyroid Function Tests: No results found for this basename: TSH, T4TOTAL, FREET4, T3FREE, THYROIDAB,  in the last 72 hours Anemia Panel: No results found for this basename: VITAMINB12, FOLATE, FERRITIN, TIBC, IRON, RETICCTPCT,  in the last 72 hours Coagulation:  Recent Labs  10/06/12 1205  LABPROT 13.7  INR 1.06   Urine Drug Screen: Drugs of Abuse  No results found for this basename: labopia, cocainscrnur, labbenz, amphetmu, thcu, labbarb    Alcohol Level: No results found for this basename: ETH,  in the last 72 hours Urinalysis:  Recent Labs  10/06/12 1841  COLORURINE YELLOW  LABSPEC >1.030*  PHURINE 5.5  GLUCOSEU NEGATIVE  HGBUR MODERATE*  BILIRUBINUR SMALL*  KETONESUR TRACE*  PROTEINUR 100*  UROBILINOGEN 0.2  NITRITE NEGATIVE  LEUKOCYTESUR MODERATE*   Misc. Labs:  ABGS No results found for this basename: PHART, PCO2, PO2ART, TCO2, HCO3,  in the last 72 hours CULTURES Recent Results (from the past 240 hour(s))  CULTURE, BLOOD (ROUTINE X 2)     Status: None   Collection Time    10/06/12  4:50 PM      Result Value Range Status   Specimen Description BLOOD RIGHT ANTECUBITAL   Final   Special Requests BOTTLES DRAWN AEROBIC AND ANAEROBIC 8CC  Final   Culture NO GROWTH 1 DAY   Final   Report Status PENDING   Incomplete  CULTURE, BLOOD (ROUTINE X 2)     Status: None   Collection Time    10/06/12  4:55 PM      Result Value Range Status   Specimen Description BLOOD LEFT ANTECUBITAL   Final   Special Requests     Final   Value: BOTTLES DRAWN AEROBIC AND ANAEROBIC AEB=15CC ANA=5CC   Culture NO GROWTH 1 DAY   Final   Report Status PENDING   Incomplete   Studies/Results: US Renal  10/07/2012   *RADIOLOGY REPORT*  Clinical Data: Renal failure.  RENAL/URINARY TRACT ULTRASOUND COMPLETE  Comparison:  Renal ultrasound dated 06/14/2012  Findings:  Right Kidney:  11.9 cm  in length.  Normal in appearance.  Left Kidney:  11.8 cm in length.  Normal in appearance.  Bladder:  The bladder is empty with a Foley catheter in place.  IMPRESSION: Normal bilateral renal ultrasound.   Original Report Authenticated By: Francene Boyers, M.D.   US Venous Img Upper Uni Left  10/06/2012   *RADIOLOGY REPORT*  Clinical Data:  Left upper extremity swelling and erythema.  LEFT UPPER EXTREMITY VENOUS DUPLEX ULTRASOUND  Technique:  Gray-scale sonography with graded compression, as well as color Doppler and duplex ultrasound were performed to evaluate the upper extremity deep venous system from the level of the subclavian vein and including the jugular, axillary, basilic and upper cephalic vein.  Spectral Doppler was utilized to evaluate flow at rest and with distal augmentation maneuvers.  Comparison:  None.  Findings:  Normal compressibility of the upper extremity deep veins is demonstrated.  No venous filling defects visualized on grayscale or color Doppler US.  Normal direction of flow is seen throughout the deep veins.  Spectral Doppler waveforms show normal morphology at rest and with distal augmentation.  No evidence of superficial thrombophlebitis or visualized abnormal fluid collections.  IMPRESSION: No evidence of left upper extremity deep venous thrombosis.   Original Report Authenticated By: Irish Lack, M.D.   US Venous Img Upper Uni Right  10/06/2012   *RADIOLOGY REPORT*  Clinical Data:  Right arm swelling.  RIGHT UPPER EXTREMITY VENOUS DUPLEX ULTRASOUND  Technique:  Gray-scale sonography with graded compression, as well as color Doppler and duplex ultrasound were performed to evaluate the upper extremity deep venous system from the level of the subclavian vein and including the jugular, axillary, basilic and upper cephalic vein.  Spectral Doppler was utilized to evaluate flow at rest and with distal augmentation maneuvers.  Comparison:  Left arm ultrasound from earlier today.   Findings:  Normal compressibility of the upper extremity deep veins is demonstrated.  No venous filling defects visualized on grayscale or color Doppler US.  Normal direction of flow is seen throughout the deep veins.  Spectral Doppler waveforms show normal morphology at rest and with distal augmentation.  IMPRESSION: No evidence of upper extremity deep venous thrombosis.   Original Report Authenticated By: Davonna Belling, M.D.   Dg Chest Portable 1 View  10/06/2012   *RADIOLOGY REPORT*  Clinical Data: Shortness of breath.  COPD.  PORTABLE CHEST - 1 VIEW  Comparison:  Portable chest 09/14/2012 and 08/10/2012.  Findings: Patchy airspace disease is seen in the left midlung zone. There is also some atelectasis in the right base.  Cardiomegaly is noted.  IMPRESSION: Patchy airspace disease in the left midlung is worrisome for pneumonia.  Right basilar subsegmental atelectasis also noted.  Original Report Authenticated By: Holley Dexter, M.D.    Medications:  Prior to Admission:  Prescriptions prior to admission  Medication Sig Dispense Refill  . albuterol (PROVENTIL HFA;VENTOLIN HFA) 108 (90 BASE) MCG/ACT inhaler Inhale 2 puffs into the lungs every 6 (six) hours as needed for wheezing.  1 Inhaler  2  . ALPRAZolam (XANAX) 0.5 MG tablet Take 0.5 mg by mouth 3 (three) times daily as needed for sleep.       Marland Kitchen aspirin 81 MG chewable tablet Chew 81 mg by mouth daily.      . calcitRIOL (ROCALTROL) 0.25 MCG capsule Take 0.25 mcg by mouth daily.      . digoxin (LANOXIN) 0.125 MG tablet Take 1 tablet (0.125 mg total) by mouth daily.  30 tablet  12  . diltiazem (CARDIZEM) 120 MG tablet Take 1 tablet (120 mg total) by mouth daily.  30 tablet  12  . fluticasone (FLONASE) 50 MCG/ACT nasal spray Place 2 sprays into the nose daily.      . Fluticasone-Salmeterol (ADVAIR DISKUS) 250-50 MCG/DOSE AEPB Inhale 1 puff into the lungs 2 (two) times daily.  60 each  12  . gabapentin (NEURONTIN) 100 MG capsule Take 100 mg by  mouth 3 (three) times daily.      Marland Kitchen HYDROcodone-acetaminophen (NORCO) 5-325 MG per tablet Take 1 tablet by mouth every 6 (six) hours as needed for pain.  30 tablet  0  . insulin aspart (NOVOLOG) 100 UNIT/ML injection Inject 3-15 Units into the skin 3 (three) times daily before meals. 121-150=3 units, 151-200=4 units, 201-250=7 units, 251-300=9 units, 301-350=12 units, 351-400=15 units.      . insulin detemir (LEVEMIR) 100 UNIT/ML injection Inject 50 Units into the skin at bedtime.       Marland Kitchen ipratropium (ATROVENT) 0.02 % nebulizer solution Take 2.5 mLs (500 mcg total) by nebulization 4 (four) times daily.  75 mL  12  . levalbuterol (XOPENEX) 0.63 MG/3ML nebulizer solution Take 3 mLs (0.63 mg total) by nebulization every 4 (four) hours as needed for wheezing.  3 mL  12  . levothyroxine (SYNTHROID, LEVOTHROID) 200 MCG tablet Take 200 mcg by mouth daily before breakfast.      . ondansetron (ZOFRAN) 4 MG tablet Take 1 tablet (4 mg total) by mouth every 6 (six) hours as needed for nausea.  20 tablet  5  . pantoprazole (PROTONIX) 40 MG tablet Take 1 tablet (40 mg total) by mouth 2 (two) times daily before a meal.  60 tablet  12  . polyethylene glycol (MIRALAX / GLYCOLAX) packet Take 17 g by mouth daily.      . predniSONE (DELTASONE) 5 MG tablet Take 2 tablets (10 mg total) by mouth daily.  60 tablet  12  . rosuvastatin (CRESTOR) 40 MG tablet Take 1 tablet (40 mg total) by mouth daily.  30 tablet  12  . sertraline (ZOLOFT) 50 MG tablet Take 1 tablet (50 mg total) by mouth daily.  30 tablet  12  . tolterodine (DETROL LA) 4 MG 24 hr capsule Take 1 capsule (4 mg total) by mouth daily.  30 capsule  12  . torsemide (DEMADEX) 20 MG tablet Take 1 tablet (20 mg total) by mouth 2 (two) times daily.  30 tablet  12  . vitamin B-12 (CYANOCOBALAMIN) 1000 MCG tablet Take 1,000 mcg by mouth daily.      . metoCLOPramide (REGLAN) 10 MG tablet Take 1 tablet (10 mg total) by mouth 4 (four) times daily -  before  meals and at  bedtime.  90 tablet  12   Scheduled: . albuterol  2.5 mg Nebulization Q4H  . aspirin  81 mg Oral Daily  . atorvastatin  80 mg Oral q1800  . calcitRIOL  0.25 mcg Oral Daily  . digoxin  0.125 mg Oral Daily  . diltiazem  120 mg Oral Daily  . fesoterodine  8 mg Oral Daily  . fluticasone  2 spray Each Nare Daily  . gabapentin  100 mg Oral TID  . heparin  5,000 Units Subcutaneous Q8H  . hydrocortisone sod succinate (SOLU-CORTEF) inj  50 mg Intravenous BID  . insulin aspart  0-20 Units Subcutaneous TID WC  . insulin aspart  0-5 Units Subcutaneous QHS  . insulin detemir  50 Units Subcutaneous QHS  . ipratropium  500 mcg Nebulization QID  . levothyroxine  200 mcg Oral QAC breakfast  . metoCLOPramide  10 mg Oral TID AC & HS  . mometasone-formoterol  2 puff Inhalation BID  . pantoprazole  40 mg Oral BID AC  . polyethylene glycol  17 g Oral Daily  . [COMPLETED] potassium chloride  20 mEq Oral BID  . sertraline  50 mg Oral Daily  . sodium chloride  3 mL Intravenous Q12H  . vancomycin  1,500 mg Intravenous Once  . vitamin B-12  1,000 mcg Oral Daily   Continuous: . sodium chloride 150 mL/hr at 10/07/12 2231   ZOX:[id number], ALPRAZolam, HYDROcodone-acetaminophen, ondansetron (ZOFRAN) IV, ondansetron, ondansetron  Assesment: She was admitted with cellulitis of her right arm. This is complicated by acute on chronic renal failure probably from ATN. She has adrenal insufficiency and now is on IV steroids. She has morbid obesity. She has diabetes. She has chronic atrial fibrillation. Principal Problem:   Cellulitis of arm, right Active Problems:   HYPOTHYROIDISM   Edema   Diabetes mellitus type 2 with complications   Chronic diastolic heart failure   Adrenal insufficiency   ARF (acute renal failure)   Morbid obesity   Chronic kidney disease, stage III (moderate)   Atrial fibrillation   Acute renal failure (ARF)    Plan: Continue with current treatments. Her renal function is slightly  better. She will need Port-A-Cath at some point. She is clearly not ready for discharge    LOS: 2 days   Chiffon Kittleson L 10/08/2012, 8:44 AM

## 2012-10-08 NOTE — Progress Notes (Signed)
Ann [name]  MRN: [id number]  DOB/AGE: 11-12-1945 67 y.o.  Primary Care Physician:HAWKINS,EDWARD L, MD  Admit date: 10/06/2012  Chief Complaint:  Chief Complaint  Patient presents with  . Shortness of Breath    S-Pt presented on  10/06/2012 with  Chief Complaint  Patient presents with  . Shortness of Breath  .    Pt today feels better than yesterday  Meds . albuterol  2.5 mg Nebulization Q4H  . aspirin  81 mg Oral Daily  . atorvastatin  80 mg Oral q1800  . calcitRIOL  0.25 mcg Oral Daily  . digoxin  0.125 mg Oral Daily  . diltiazem  120 mg Oral Daily  . fesoterodine  8 mg Oral Daily  . fluticasone  2 spray Each Nare Daily  . gabapentin  100 mg Oral TID  . heparin  5,000 Units Subcutaneous Q8H  . hydrocortisone sod succinate (SOLU-CORTEF) inj  50 mg Intravenous BID  . insulin aspart  0-20 Units Subcutaneous TID WC  . insulin aspart  0-5 Units Subcutaneous QHS  . insulin detemir  50 Units Subcutaneous QHS  . ipratropium  500 mcg Nebulization QID  . levothyroxine  200 mcg Oral QAC breakfast  . metoCLOPramide  10 mg Oral TID AC & HS  . mometasone-formoterol  2 puff Inhalation BID  . pantoprazole  40 mg Oral BID AC  . polyethylene glycol  17 g Oral Daily  . sertraline  50 mg Oral Daily  . sodium chloride  3 mL Intravenous Q12H  . vitamin B-12  1,000 mcg Oral Daily      Physical Exam: Vital signs in last 24 hours: Temp:  [97.8 F (36.6 C)-97.9 F (36.6 C)] 97.9 F (36.6 C) (05/15 0500) Pulse Rate:  [79-122] 79 (05/15 0500) Resp:  [20] 20 (05/15 0500) BP: (101-112)/(58-63) 101/63 mmHg (05/15 0500) SpO2:  [80 %-96 %] 96 % (05/15 0842) Weight change:  Last BM Date: 10/07/12  Intake/Output from previous day: 05/14 0701 - 05/15 0700 In: 360 [P.O.:360] Out: -  Total I/O In: 120 [P.O.:120] Out: -  No recorded   Physical Exam: General- pt is awake,alert, oriented to time place and person Resp- No acute REsp distress, Decreased Bs at bases. CVS- S1S2  irregular in rate and rhythm GIT- BS+, soft, NT, ND, Morbidly obese. EXT- NO LE Edema, Cyanosis, Right UE erythema +    Lab Results: CBC  Recent Labs  10/06/12 1205 10/07/12 0457  WBC 9.4 7.1  HGB 9.5* 9.2*  HCT 29.8* 29.0*  PLT 296 277    BMET  Recent Labs  10/07/12 0457 10/08/12 0448  NA 140 140  K 3.3* 3.5  CL 102 103  CO2 25 23  GLUCOSE 266* 201*  BUN 37* 39*  CREATININE 4.50* 4.33*  CALCIUM 7.4* 7.2*    Trend Creat  2014 0.9==>4.0==>4.5 ==>4.3 AKI in March 3.6==>1.5  AKi in Feb 2.5==>1.0   MICRO Recent Results (from the past 240 hour(s))  CULTURE, BLOOD (ROUTINE X 2)     Status: None   Collection Time    10/06/12  4:50 PM      Result Value Range Status   Specimen Description BLOOD RIGHT ANTECUBITAL   Final   Special Requests BOTTLES DRAWN AEROBIC AND ANAEROBIC 8CC   Final   Culture NO GROWTH 2 DAYS   Final   Report Status PENDING   Incomplete  CULTURE, BLOOD (ROUTINE X 2)     Status: None   Collection Time  10/06/12  4:55 PM      Result Value Range Status   Specimen Description BLOOD LEFT ANTECUBITAL   Final   Special Requests     Final   Value: BOTTLES DRAWN AEROBIC AND ANAEROBIC AEB=15CC ANA=5CC   Culture NO GROWTH 2 DAYS   Final   Report Status PENDING   Incomplete      Lab Results  Component Value Date   PTH 177.0* 06/28/2012   CALCIUM 7.2* 10/08/2012   PHOS 2.7 08/04/2012               Impression: 1)Renal AKI secondary to Prerenal/ ATN  AKI on CKD AKI most likely Oliguric ATN from Hypotension  CKD secondary to post renal as had hydro earlier.   2)CVs Hemodynamically fragile  Was hypotensive  Now better   3)Anemia HGb at goal (9--11)    4)CKD Mineral-Bone Disorder  PTH elevated .  Secondary Hyperparathyroidism Present.  Phosphorus at goal.   5)ID-admitted with Cellulitis  On ABX   6)FEN  Was Hypokalemic now better  NOrmonatremic   7)Acid base  Co2 at goal     Plan: Will start lasix Will change IVf  NS to NS + KCL  Will follow BMET Educated pt about slow recovery ion Renal function and possible need of Dialysis.       Armando Bukhari S 10/08/2012, 12:12 PM

## 2012-10-08 NOTE — Discharge Summary (Signed)
NAME:  Ann [name], Ann [name]              ACCOUNT NO.:  [id number]  MEDICAL RECORD NO.:  [id number]  LOCATION:  [facility]                         FACILITY:  [facility]  PHYSICIAN:  Tanikka Bresnan L. Juanetta Gosling, M.D.DATE OF BIRTH:  09/07/1945  DATE OF ADMISSION:  09/14/2012 DATE OF DISCHARGE:  04/29/2014LH                              DISCHARGE SUMMARY   ADDENDUM:  ADDITIONAL DIAGNOSES: 1. Diabetes mellitus type 2, uncontrolled. 2. Diabetic gastroparesis.     Osvaldo Lamping L. Juanetta Gosling, M.D.     ELH/MEDQ  D:  10/07/2012  T:  10/08/2012  Job:  [id number]
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
## 2012-10-08 NOTE — Progress Notes (Signed)
Inpatient Diabetes Program Recommendations  AACE/ADA: New Consensus Statement on Inpatient Glycemic Control (2013)  Target Ranges:  Prepandial:   less than 140 mg/dL      Peak postprandial:   less than 180 mg/dL (1-2 hours)      Critically ill patients:  140 - 180 mg/dL   Results for Ann [name], Ann [name] (MRN [id number]) as of 10/08/2012 10:16  Ref. Range 10/07/2012 07:44 10/07/2012 11:41 10/07/2012 17:07 10/07/2012 22:08  Glucose-Capillary Latest Range: 70-99 mg/dL 413 (H) 244 (H) 010 (H) 207 (H)    Inpatient Diabetes Program Recommendations Insulin - Basal: Please consider increasing Levemir to 55 units QHS. Insulin - Meal Coverage: Please consider ordering Novolog 3 units meal coverage TID with meals (while on steroids).  Note: Blood glucose over the past 24 hours has consistently been greater than 200 mg/dl.  Please consider increasing Levemir to 55 units QHS and ordering Novolog 3 units TID meal coverage.  Will continue to follow.  Thanks, Orlando Penner, RN, BSN, CCRN Diabetes Coordinator Inpatient Diabetes Program [phone]

## 2012-10-09 DIAGNOSIS — E2749 Other adrenocortical insufficiency: Secondary | ICD-10-CM | POA: Diagnosis not present

## 2012-10-09 DIAGNOSIS — N179 Acute kidney failure, unspecified: Secondary | ICD-10-CM | POA: Diagnosis not present

## 2012-10-09 DIAGNOSIS — IMO0002 Reserved for concepts with insufficient information to code with codable children: Secondary | ICD-10-CM | POA: Diagnosis not present

## 2012-10-09 DIAGNOSIS — D649 Anemia, unspecified: Secondary | ICD-10-CM | POA: Diagnosis not present

## 2012-10-09 LAB — GLUCOSE, CAPILLARY
Glucose-Capillary: 129 mg/dL — ABNORMAL HIGH (ref 70–99)
Glucose-Capillary: 143 mg/dL — ABNORMAL HIGH (ref 70–99)
Glucose-Capillary: 151 mg/dL — ABNORMAL HIGH (ref 70–99)
Glucose-Capillary: 123 mg/dL — ABNORMAL HIGH (ref 70–99)

## 2012-10-09 LAB — URINE CULTURE

## 2012-10-09 LAB — VANCOMYCIN, RANDOM: Vancomycin Rm: 16.4 ug/mL

## 2012-10-09 LAB — BASIC METABOLIC PANEL
CO2: 22 mEq/L (ref 19–32)
Chloride: 107 mEq/L (ref 96–112)
Creatinine, Ser: 3.63 mg/dL — ABNORMAL HIGH (ref 0.50–1.10)
GFR calc Af Amer: 14 mL/min — ABNORMAL LOW (ref >90)
Sodium: 140 mEq/L (ref 135–145)

## 2012-10-09 MED ORDER — POTASSIUM CHLORIDE CRYS ER 20 MEQ PO TBCR
20.0000 meq | EXTENDED_RELEASE_TABLET | Freq: Two times a day (BID) | ORAL | Status: AC
  Administered 2012-10-09 (×2): 20 meq via ORAL
  Filled 2012-10-09 (×2): qty 1

## 2012-10-09 MED ORDER — VANCOMYCIN HCL 10 G IV SOLR
1500.0000 mg | INTRAVENOUS | Status: DC
  Administered 2012-10-09: 1500 mg via INTRAVENOUS
  Filled 2012-10-09 (×2): qty 1500

## 2012-10-09 MED ORDER — INSULIN DETEMIR 100 UNIT/ML ~~LOC~~ SOLN
55.0000 [IU] | Freq: Every day | SUBCUTANEOUS | Status: DC
  Administered 2012-10-09 – 2012-10-14 (×6): 55 [IU] via SUBCUTANEOUS
  Filled 2012-10-09 (×7): qty 0.55

## 2012-10-09 MED ORDER — INSULIN ASPART 100 UNIT/ML ~~LOC~~ SOLN
3.0000 [IU] | Freq: Three times a day (TID) | SUBCUTANEOUS | Status: DC
  Administered 2012-10-09 – 2012-10-15 (×12): 3 [IU] via SUBCUTANEOUS

## 2012-10-09 NOTE — Evaluation (Signed)
Physical Therapy Evaluation Patient Details Name: Ann [name] MRN: [id number] DOB: Aug 11, 1945 Today's Date: 10/09/2012 Time: 4098-1191 PT Time Calculation (min): 45 min  PT Assessment / Plan / Recommendation Clinical Impression  Pt was seen again for evaluation.  She has become significantly weaker since last eval 3 weeks ago and now has very limited mobility.  She required max assist to transfer bed to chair using a walker.  She refuses to return to SNF which is my recommendation.  I am not certain that her husband and sons will be able to manage her transfers.  A Hoyer lift may need to be obtained.    PT Assessment  Patient needs continued PT services    Follow Up Recommendations  Home health PT (per pt request--I recommend SNF)    Does the patient have the potential to tolerate intense rehabilitation      Barriers to Discharge None      Equipment Recommendations  None recommended by PT (may need a Nurse, adult)    Recommendations for Other Services     Frequency Min 3X/week    Precautions / Restrictions Precautions Precaution Comments: contact precautions Restrictions Weight Bearing Restrictions: No   Pertinent Vitals/Pain       Mobility  Bed Mobility Bed Mobility: Sitting - Scoot to Edge of Bed Supine to Sit: 2: Max assist;HOB elevated Transfers Transfers: Stand Pivot Transfers Sit to Stand: 2: Max assist;With upper extremity assist;From elevated surface;From bed Stand Pivot Transfers: 3: Mod assist (using a walker) Ambulation/Gait Ambulation/Gait Assistance: Not tested (comment) (unable)    Exercises General Exercises - Lower Extremity Ankle Circles/Pumps: Strengthening;Both;10 reps;Supine Short Arc Quad: Both;AAROM;10 reps;Supine Heel Slides: AROM;Both;10 reps;Supine Hip ABduction/ADduction: AROM;Both;10 reps;Supine   PT Diagnosis: Difficulty walking;Generalized weakness  PT Problem List: Decreased strength;Decreased activity tolerance;Decreased  mobility;Cardiopulmonary status limiting activity;Obesity PT Treatment Interventions: Functional mobility training;Therapeutic activities;Therapeutic exercise;Patient/family education;Gait training   PT Goals Acute Rehab PT Goals PT Goal Formulation: With patient Pt will go Supine/Side to Sit: with mod assist;with HOB not 0 degrees (comment degree) PT Goal: Supine/Side to Sit - Progress: Goal set today Pt will go Sit to Stand: with mod assist;with upper extremity assist PT Goal: Sit to Stand - Progress: Goal set today Pt will Transfer Bed to Chair/Chair to Bed: with mod assist (using walker) PT Transfer Goal: Bed to Chair/Chair to Bed - Progress: Goal set today Pt will Ambulate: 1 - 15 feet;with rolling walker;with mod assist PT Goal: Ambulate - Progress: Goal set today PT Goal: Propel Wheelchair - Progress: Discontinued (comment) (defer to a later time)  Visit Information  Last PT Received On: 10/09/12    Subjective Data  Subjective: I've gotten much weaker Patient Stated Goal: will not go back to SNF...wants to go home   Prior Functioning  Home Living Lives With: Spouse;Family    Cognition  Cognition Arousal/Alertness: Awake/alert Behavior During Therapy: WFL for tasks assessed/performed Overall Cognitive Status: Within Functional Limits for tasks assessed    Extremity/Trunk Assessment Right Lower Extremity Assessment RLE ROM/Strength/Tone: Deficits RLE ROM/Strength/Tone Deficits: hip strength is 3/5 but quadriceps strength =2+/5 RLE Coordination: WFL - gross motor Left Lower Extremity Assessment LLE ROM/Strength/Tone: Deficits LLE ROM/Strength/Tone Deficits: hip strength is 3/5, quadriceps strength = 2+/5 LLE Coordination: WFL - gross motor Trunk Assessment Trunk Assessment: Kyphotic   Balance Balance Balance Assessed: Yes Dynamic Sitting Balance Dynamic Sitting - Balance Support: No upper extremity supported;Feet supported Dynamic Sitting - Level of Assistance: 6:  Modified independent (Device/Increase time)  End of Session  PT - End of Session Equipment Utilized During Treatment: Gait belt;Oxygen Activity Tolerance: Patient limited by fatigue Patient left: in chair;with call bell/phone within reach Nurse Communication: Mobility status  GP     Konrad Penta 10/09/2012, 11:48 AM
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
## 2012-10-09 NOTE — Progress Notes (Signed)
Subjective: Interval History: has no complaint of nausea or vomiting. Patient presently denies any difficulty in breathing. He states that still he has swelling of the legs..  Objective: Vital signs in last 24 hours: Temp:  [97.6 F (36.4 C)-98.1 F (36.7 C)] 98.1 F (36.7 C) (05/16 0514) Pulse Rate:  [86-93] 87 (05/16 0514) Resp:  [20] 20 (05/16 0514) BP: (97-104)/(54-65) 104/57 mmHg (05/16 0514) SpO2:  [92 %-97 %] 94 % (05/16 0723) Weight change:   Intake/Output from previous day: 05/15 0701 - 05/16 0700 In: 2745 [P.O.:240; I.V.:2505] Out: 550 [Urine:550] Intake/Output this shift:    General appearance: alert, cooperative and no distress Resp: clear to auscultation bilaterally Cardio: regular rate and rhythm, S1, S2 normal, no murmur, click, rub or gallop GI: soft, non-tender; bowel sounds normal; no masses,  no organomegaly Extremities: edema 2+ edema  Lab Results:  Recent Labs  10/06/12 1205 10/07/12 0457  WBC 9.4 7.1  HGB 9.5* 9.2*  HCT 29.8* 29.0*  PLT 296 277   BMET:  Recent Labs  10/08/12 0448 10/09/12 0451  NA 140 140  K 3.5 4.1  CL 103 107  CO2 23 22  GLUCOSE 201* 148*  BUN 39* 40*  CREATININE 4.33* 3.63*  CALCIUM 7.2* 7.2*   No results found for this basename: PTH,  in the last 72 hours Iron Studies: No results found for this basename: IRON, TIBC, TRANSFERRIN, FERRITIN,  in the last 72 hours  Studies/Results: US Renal  10/07/2012   *RADIOLOGY REPORT*  Clinical Data: Renal failure.  RENAL/URINARY TRACT ULTRASOUND COMPLETE  Comparison:  Renal ultrasound dated 06/14/2012  Findings:  Right Kidney:  11.9 cm in length.  Normal in appearance.  Left Kidney:  11.8 cm in length.  Normal in appearance.  Bladder:  The bladder is empty with a Foley catheter in place.  IMPRESSION: Normal bilateral renal ultrasound.   Original Report Authenticated By: Francene Boyers, M.D.    I have reviewed the patient's current medications.  Assessment/Plan: Problem #1  renal failure acute on chronic his BUN is 40 creatinine 3.63 renal function progressively improving. Presently patient doesn't have any uremic sinus symptoms. Problem #2 anasarca patient on diuretics with good urine output. problem #3 diabetes Problem #4 hypertension his blood pressure seems to be reasonably controlled. Problem #5 history of her hypothyroidism Problem #6 hypokalemia that has corrected Problem #7 anemia his hemoglobin and hematocrit is low. Patient most likely secondary to chronic renal failure versus iron deficiency anemia. Problem #8 history of a trial fibrillation. Problem #9 history of adrenal insufficiency. Plan: Decrease IV fluid to to 75 cc per hour We'll increase his oral potassium. Check his basic metabolic panel in the morning.    LOS: 3 days   Chaise Passarella S 10/09/2012,7:37 AM

## 2012-10-09 NOTE — Progress Notes (Signed)
Subjective: No acute change.  Objective: Vital signs in last 24 hours: Temp:  [97.6 F (36.4 [name])-98.1 F (36.7 [name])] 98.1 F (36.7 [name]) (05/16 0514) Pulse Rate:  [78-93] 78 (05/16 1026) Resp:  [20] 20 (05/16 0514) BP: (97-104)/(54-65) 104/57 mmHg (05/16 0514) SpO2:  [92 %-97 %] 97 % (05/16 1133) Last BM Date: 10/07/12  Intake/Output from previous day: 05/15 0701 - 05/16 0700 In: 2745 [P.O.:240; I.V.:2505] Out: 550 [Urine:550] Intake/Output this shift: Total I/O In: 120 [P.O.:120] Out: 200 [Urine:200]  General appearance: alert and no distress Skin: Right arm erythema  Lab Results:   Recent Labs  10/06/12 1205 10/07/12 0457  WBC 9.4 7.1  HGB 9.5* 9.2*  HCT 29.8* 29.0*  PLT 296 277   BMET  Recent Labs  10/08/12 0448 10/09/12 0451  NA 140 140  K 3.5 4.1  CL 103 107  CO2 23 22  GLUCOSE 201* 148*  BUN 39* 40*  CREATININE 4.33* 3.63*  CALCIUM 7.2* 7.2*   PT/INR  Recent Labs  10/06/12 1205  LABPROT 13.7  INR 1.06   ABG No results found for this basename: PHART, PCO2, PO2, HCO3,  in the last 72 hours  Studies/Results: US Renal  10/07/2012   *RADIOLOGY REPORT*  Clinical Data: Renal failure.  RENAL/URINARY TRACT ULTRASOUND COMPLETE  Comparison:  Renal ultrasound dated 06/14/2012  Findings:  Right Kidney:  11.9 cm in length.  Normal in appearance.  Left Kidney:  11.8 cm in length.  Normal in appearance.  Bladder:  The bladder is empty with a Foley catheter in place.  IMPRESSION: Normal bilateral renal ultrasound.   Original Report Authenticated By: Francene Boyers, M.D.    Anti-infectives: Anti-infectives   Start     Dose/Rate Route Frequency Ordered Stop   10/09/12 1000  vancomycin (VANCOCIN) 1,500 mg in sodium chloride 0.9 % 500 mL IVPB     1,500 mg 250 mL/hr over 120 Minutes Intravenous Every 48 hours 10/09/12 0740     10/07/12 1200  vancomycin (VANCOCIN) 1,500 mg in sodium chloride 0.9 % 500 mL IVPB     1,500 mg 250 mL/hr over 120 Minutes Intravenous   Once 10/07/12 1036 10/07/12 1411   10/07/12 1000  azithromycin (ZITHROMAX) tablet 250 mg  Status:  Discontinued     250 mg Oral Daily 10/06/12 1626 10/06/12 1757   10/06/12 1930  vancomycin (VANCOCIN) 1,500 mg in sodium chloride 0.9 % 500 mL IVPB  Status:  Discontinued     1,500 mg 250 mL/hr over 120 Minutes Intravenous  Once 10/06/12 1837 10/08/12 0859   10/06/12 1800  cefTRIAXone (ROCEPHIN) 1 g in dextrose 5 % 50 mL IVPB  Status:  Discontinued     1 g 100 mL/hr over 30 Minutes Intravenous Every 24 hours 10/06/12 1757 10/06/12 1803   10/06/12 1630  azithromycin (ZITHROMAX) tablet 500 mg     500 mg Oral Daily 10/06/12 1626 10/06/12 1742   10/06/12 1600  vancomycin (VANCOCIN) IVPB 1000 mg/200 mL premix     1,000 mg 200 mL/hr over 60 Minutes Intravenous  Once 10/06/12 1551 10/06/12 1830   10/06/12 1600  piperacillin-tazobactam (ZOSYN) IVPB 3.375 g     3.375 g 12.5 mL/hr over 240 Minutes Intravenous  Once 10/06/12 1551 10/06/12 1835      Assessment/Plan: s/p * No surgery found * Chronic cellulitis. Her morning labs have demonstrated some improvement. I do feel at this point the best option would be to proceed with Port-A-Cath placement on Monday, should patient wished to  proceed. In light of the weekend if patient continues to show improvement of her renal function and is at the point of potential discharge we can even schedule the Port-A-Cath placement as an outpatient for Monday. If she is not discharge from the hospital, we will plan to proceed obviously as an inpatient on Monday.  LOS: 3 days    Ann [name] 10/09/2012

## 2012-10-09 NOTE — Progress Notes (Signed)
Patient is currently active with [facility] Care Management for chronic disease management services.  Patient has been telephonically engaged by a [employer].  Our community based plan of care has focused on disease management of CHF, HTN, and medication adherence.  Patient will receive a post discharge transition of care call and will be evaluated for monthly home visits for assessments and disease process education.  Patient has agreed to continue services.  Made inpatient Case Manager aware that [facility] Care Management following. Of note, [facility] Care Management services does not replace or interfere with any services that are arranged by inpatient case management or social work.  For additional questions or referrals please contact Anibal Henderson BSN RN [facility] Care Liaison at [phone].

## 2012-10-09 NOTE — Progress Notes (Signed)
CRITICAL VALUE ALERT  Critical value received:  VRE urine > 100,000 colonies  Date of notification:  10/09/2012  Time of notification:  20:35  Critical value read back:yes  Nurse who received alert:  Retta Diones  MD notified (1st page):  McInnis  Time of first page:  20:40  MD notified (2nd page):  Time of second page:  Responding MD:    Time MD responded:

## 2012-10-09 NOTE — Progress Notes (Signed)
Subjective: She says she feels a little bit better and is slightly stronger. Her renal function is improved somewhat. She says she does not feel like eating. At home she was able to ambulate a little bit but she feels very weak here  Objective: Vital signs in last 24 hours: Temp:  [97.6 F (36.4 C)-98.1 F (36.7 C)] 98.1 F (36.7 C) (05/16 0514) Pulse Rate:  [86-93] 87 (05/16 0514) Resp:  [20] 20 (05/16 0514) BP: (97-104)/(54-65) 104/57 mmHg (05/16 0514) SpO2:  [92 %-97 %] 94 % (05/16 0723) Weight change:  Last BM Date: 10/07/12  Intake/Output from previous day: 05/15 0701 - 05/16 0700 In: 2745 [P.O.:240; I.V.:2505] Out: 550 [Urine:550]  PHYSICAL EXAM General appearance: alert, cooperative, mild distress and morbidly obese Resp: clear to auscultation bilaterally Cardio: irregularly irregular rhythm GI: soft, non-tender; bowel sounds normal; no masses,  no organomegaly Extremities: She has cellulitic changes on her right arm but it is much improved  Lab Results:    Basic Metabolic Panel:  Recent Labs  16/10/96 0448 10/09/12 0451  NA 140 140  K 3.5 4.1  CL 103 107  CO2 23 22  GLUCOSE 201* 148*  BUN 39* 40*  CREATININE 4.33* 3.63*  CALCIUM 7.2* 7.2*   Liver Function Tests:  Recent Labs  10/07/12 0457  AST 39*  ALT 20  ALKPHOS 193*  BILITOT 0.4  PROT 5.9*  ALBUMIN 1.8*   No results found for this basename: LIPASE, AMYLASE,  in the last 72 hours No results found for this basename: AMMONIA,  in the last 72 hours CBC:  Recent Labs  10/06/12 1205 10/07/12 0457  WBC 9.4 7.1  NEUTROABS 6.3  --   HGB 9.5* 9.2*  HCT 29.8* 29.0*  MCV 102.1* 100.7*  PLT 296 277   Cardiac Enzymes:  Recent Labs  10/06/12 1205  TROPONINI <0.30   BNP:  Recent Labs  10/06/12 1205  PROBNP 940.3*   D-Dimer:  Recent Labs  10/06/12 1205  DDIMER 4.94*   CBG:  Recent Labs  10/07/12 2208 10/08/12 0736 10/08/12 1148 10/08/12 1623 10/08/12 2114 10/09/12 0730   GLUCAP 207* 164* 185* 165* 276* 129*   Hemoglobin A1C: No results found for this basename: HGBA1C,  in the last 72 hours Fasting Lipid Panel: No results found for this basename: CHOL, HDL, LDLCALC, TRIG, CHOLHDL, LDLDIRECT,  in the last 72 hours Thyroid Function Tests: No results found for this basename: TSH, T4TOTAL, FREET4, T3FREE, THYROIDAB,  in the last 72 hours Anemia Panel: No results found for this basename: VITAMINB12, FOLATE, FERRITIN, TIBC, IRON, RETICCTPCT,  in the last 72 hours Coagulation:  Recent Labs  10/06/12 1205  LABPROT 13.7  INR 1.06   Urine Drug Screen: Drugs of Abuse  No results found for this basename: labopia, cocainscrnur, labbenz, amphetmu, thcu, labbarb    Alcohol Level: No results found for this basename: ETH,  in the last 72 hours Urinalysis:  Recent Labs  10/06/12 1841  COLORURINE YELLOW  LABSPEC >1.030*  PHURINE 5.5  GLUCOSEU NEGATIVE  HGBUR MODERATE*  BILIRUBINUR SMALL*  KETONESUR TRACE*  PROTEINUR 100*  UROBILINOGEN 0.2  NITRITE NEGATIVE  LEUKOCYTESUR MODERATE*   Misc. Labs:  ABGS No results found for this basename: PHART, PCO2, PO2ART, TCO2, HCO3,  in the last 72 hours CULTURES Recent Results (from the past 240 hour(s))  CULTURE, BLOOD (ROUTINE X 2)     Status: None   Collection Time    10/06/12  4:50 PM  Result Value Range Status   Specimen Description BLOOD RIGHT ANTECUBITAL   Final   Special Requests BOTTLES DRAWN AEROBIC AND ANAEROBIC 8CC   Final   Culture NO GROWTH 3 DAYS   Final   Report Status PENDING   Incomplete  CULTURE, BLOOD (ROUTINE X 2)     Status: None   Collection Time    10/06/12  4:55 PM      Result Value Range Status   Specimen Description BLOOD LEFT ANTECUBITAL   Final   Special Requests     Final   Value: BOTTLES DRAWN AEROBIC AND ANAEROBIC AEB=15CC ANA=5CC   Culture NO GROWTH 3 DAYS   Final   Report Status PENDING   Incomplete  URINE CULTURE     Status: None   Collection Time    10/06/12   6:41 PM      Result Value Range Status   Specimen Description URINE, CATHETERIZED   Final   Special Requests NONE   Final   Culture  Setup Time 10/06/2012 19:00   Final   Colony Count >=100,000 COLONIES/ML   Final   Culture GRAM POSITIVE COCCI   Final   Report Status PENDING   Incomplete   Studies/Results: US Renal  10/07/2012   *RADIOLOGY REPORT*  Clinical Data: Renal failure.  RENAL/URINARY TRACT ULTRASOUND COMPLETE  Comparison:  Renal ultrasound dated 06/14/2012  Findings:  Right Kidney:  11.9 cm in length.  Normal in appearance.  Left Kidney:  11.8 cm in length.  Normal in appearance.  Bladder:  The bladder is empty with a Foley catheter in place.  IMPRESSION: Normal bilateral renal ultrasound.   Original Report Authenticated By: Francene Boyers, M.D.    Medications:  Prior to Admission:  Prescriptions prior to admission  Medication Sig Dispense Refill  . albuterol (PROVENTIL HFA;VENTOLIN HFA) 108 (90 BASE) MCG/ACT inhaler Inhale 2 puffs into the lungs every 6 (six) hours as needed for wheezing.  1 Inhaler  2  . ALPRAZolam (XANAX) 0.5 MG tablet Take 0.5 mg by mouth 3 (three) times daily as needed for sleep.       Marland Kitchen aspirin 81 MG chewable tablet Chew 81 mg by mouth daily.      . calcitRIOL (ROCALTROL) 0.25 MCG capsule Take 0.25 mcg by mouth daily.      . digoxin (LANOXIN) 0.125 MG tablet Take 1 tablet (0.125 mg total) by mouth daily.  30 tablet  12  . diltiazem (CARDIZEM) 120 MG tablet Take 1 tablet (120 mg total) by mouth daily.  30 tablet  12  . fluticasone (FLONASE) 50 MCG/ACT nasal spray Place 2 sprays into the nose daily.      . Fluticasone-Salmeterol (ADVAIR DISKUS) 250-50 MCG/DOSE AEPB Inhale 1 puff into the lungs 2 (two) times daily.  60 each  12  . gabapentin (NEURONTIN) 100 MG capsule Take 100 mg by mouth 3 (three) times daily.      Marland Kitchen HYDROcodone-acetaminophen (NORCO) 5-325 MG per tablet Take 1 tablet by mouth every 6 (six) hours as needed for pain.  30 tablet  0  . insulin  aspart (NOVOLOG) 100 UNIT/ML injection Inject 3-15 Units into the skin 3 (three) times daily before meals. 121-150=3 units, 151-200=4 units, 201-250=7 units, 251-300=9 units, 301-350=12 units, 351-400=15 units.      . insulin detemir (LEVEMIR) 100 UNIT/ML injection Inject 50 Units into the skin at bedtime.       Marland Kitchen ipratropium (ATROVENT) 0.02 % nebulizer solution Take 2.5 mLs (500 mcg total) by  nebulization 4 (four) times daily.  75 mL  12  . levalbuterol (XOPENEX) 0.63 MG/3ML nebulizer solution Take 3 mLs (0.63 mg total) by nebulization every 4 (four) hours as needed for wheezing.  3 mL  12  . levothyroxine (SYNTHROID, LEVOTHROID) 200 MCG tablet Take 200 mcg by mouth daily before breakfast.      . ondansetron (ZOFRAN) 4 MG tablet Take 1 tablet (4 mg total) by mouth every 6 (six) hours as needed for nausea.  20 tablet  5  . pantoprazole (PROTONIX) 40 MG tablet Take 1 tablet (40 mg total) by mouth 2 (two) times daily before a meal.  60 tablet  12  . polyethylene glycol (MIRALAX / GLYCOLAX) packet Take 17 g by mouth daily.      . predniSONE (DELTASONE) 5 MG tablet Take 2 tablets (10 mg total) by mouth daily.  60 tablet  12  . rosuvastatin (CRESTOR) 40 MG tablet Take 1 tablet (40 mg total) by mouth daily.  30 tablet  12  . sertraline (ZOLOFT) 50 MG tablet Take 1 tablet (50 mg total) by mouth daily.  30 tablet  12  . tolterodine (DETROL LA) 4 MG 24 hr capsule Take 1 capsule (4 mg total) by mouth daily.  30 capsule  12  . torsemide (DEMADEX) 20 MG tablet Take 1 tablet (20 mg total) by mouth 2 (two) times daily.  30 tablet  12  . vitamin B-12 (CYANOCOBALAMIN) 1000 MCG tablet Take 1,000 mcg by mouth daily.      . metoCLOPramide (REGLAN) 10 MG tablet Take 1 tablet (10 mg total) by mouth 4 (four) times daily -  before meals and at bedtime.  90 tablet  12   Scheduled: . albuterol  2.5 mg Nebulization Q4H  . aspirin  81 mg Oral Daily  . atorvastatin  80 mg Oral q1800  . calcitRIOL  0.25 mcg Oral Daily  .  digoxin  0.125 mg Oral Daily  . diltiazem  120 mg Oral Daily  . fesoterodine  8 mg Oral Daily  . fluticasone  2 spray Each Nare Daily  . furosemide  40 mg Intravenous BID  . gabapentin  100 mg Oral TID  . heparin  5,000 Units Subcutaneous Q8H  . hydrocortisone sod succinate (SOLU-CORTEF) inj  50 mg Intravenous BID  . insulin aspart  0-20 Units Subcutaneous TID WC  . insulin aspart  0-5 Units Subcutaneous QHS  . insulin detemir  50 Units Subcutaneous QHS  . ipratropium  500 mcg Nebulization QID  . levothyroxine  200 mcg Oral QAC breakfast  . metoCLOPramide  10 mg Oral TID AC & HS  . mometasone-formoterol  2 puff Inhalation BID  . pantoprazole  40 mg Oral BID AC  . polyethylene glycol  17 g Oral Daily  . potassium chloride  20 mEq Oral BID  . sertraline  50 mg Oral Daily  . sodium chloride  3 mL Intravenous Q12H  . vancomycin  1,500 mg Intravenous Q48H  . vitamin B-12  1,000 mcg Oral Daily   Continuous: . 0.9 % NaCl with KCl 20 mEq / L 75 mL/hr at 10/09/12 0827   [id number], ALPRAZolam, HYDROcodone-acetaminophen, ondansetron (ZOFRAN) IV, ondansetron, ondansetron  Assesment: She was admitted with cellulitis of her right arm. She has acute on chronic renal failure. She has chronic diastolic heart failure and that seems a little bit better. Her blood sugar is not totally controlled. Her adrenal insufficiency is better with IV steroids Principal Problem:   Cellulitis  of arm, right Active Problems:   HYPOTHYROIDISM   Edema   Diabetes mellitus type 2 with complications   Chronic diastolic heart failure   Adrenal insufficiency   ARF (acute renal failure)   Morbid obesity   Chronic kidney disease, stage III (moderate)   Atrial fibrillation   Acute renal failure (ARF)    Plan: Continue current treatments adjust her insulin    LOS: 3 days   Martie Fulgham L 10/09/2012, 8:48 AM

## 2012-10-09 NOTE — Clinical Documentation Improvement (Signed)
RESPIRATORY FAILURE DOCUMENTATION CLARIFICATION QUERY   THIS DOCUMENT IS NOT A PERMANENT PART OF THE MEDICAL RECORD  TO RESPOND TO THE THIS QUERY, FOLLOW THE INSTRUCTIONS BELOW:  1. If needed, update documentation for the patient's encounter via the notes activity.  2. Access this query again and click edit on the In [employer].  3. After updating, or not, click F2 to complete all highlighted (required) fields concerning your review. Select "additional documentation in the medical record" OR "no additional documentation provided".  4. Click Sign note button.  5. The deficiency will fall out of your In Basket *Please let us know if you are not able to complete this workflow by phone or e-mail (listed below).  Please update your documentation within the medical record to reflect your response to this query.                                                                                    10/09/12  Dear Dr.Chey Cho Marton Redwood,  In a better effort to capture your patient's severity of illness, reflect appropriate length of stay and utilization of resources, a review of the patient medical record has revealed the following indicators.   Based on your clinical judgment, please clarify and document in a progress note and/or discharge summary the clinical condition associated with the following supporting information: In responding to this query please exercise your independent judgment.  The fact that a query is asked, does not imply that any particular answer is desired or expected.  Please clarify respiratory status. Thank you  Possible Clinical Conditions?  Acute Respiratory Failure Acute on Chronic Respiratory Failure Chronic Respiratory Failure Other Condition________________ Cannot Clinically Determine    Supporting Information:  Risk Factors: Oxygen dependent COPD Hypoxic Morbid obesity Chronic Diastolic Heart failure EMS reports that pt's O2 sat was 88% on 4L of oxygen  when they arrived on the scene.  Signs&Symptoms: Chief complaint in ED: Shortness of breath  Diagnostics: Radiology: 5/13 [id number]:  Patchy airspace disease in the left midlung is worrisome for  pneumonia. Right basilar subsegmental atelectasis also noted.    Treatment: O2 4L/m via  Albuterol inhaler 2 puffs q6h prn Albuterol nebs 2.5mg  q4h Flonase nasal spray 2 spray each nare qd Atrovent neb qid Bed rest w/ bathroom privileges Pulse ox w/VS  You may use possible, probable, or suspect with inpatient documentation. possible, probable, suspected diagnoses MUST be documented at the time of discharge  Reviewed: additional documentation in the medical record  Thank You,  Debora T Williams RN, MSN Clinical Documentation Specialist: Office# [phone] [facility] Information [facility] [place]

## 2012-10-10 DIAGNOSIS — N390 Urinary tract infection, site not specified: Secondary | ICD-10-CM | POA: Diagnosis not present

## 2012-10-10 DIAGNOSIS — N170 Acute kidney failure with tubular necrosis: Secondary | ICD-10-CM | POA: Diagnosis not present

## 2012-10-10 DIAGNOSIS — IMO0002 Reserved for concepts with insufficient information to code with codable children: Secondary | ICD-10-CM | POA: Diagnosis not present

## 2012-10-10 DIAGNOSIS — E2749 Other adrenocortical insufficiency: Secondary | ICD-10-CM | POA: Diagnosis not present

## 2012-10-10 DIAGNOSIS — D649 Anemia, unspecified: Secondary | ICD-10-CM | POA: Diagnosis not present

## 2012-10-10 DIAGNOSIS — R079 Chest pain, unspecified: Secondary | ICD-10-CM | POA: Diagnosis not present

## 2012-10-10 DIAGNOSIS — N179 Acute kidney failure, unspecified: Secondary | ICD-10-CM | POA: Diagnosis not present

## 2012-10-10 DIAGNOSIS — Z2239 Carrier of other specified bacterial diseases: Secondary | ICD-10-CM

## 2012-10-10 DIAGNOSIS — J189 Pneumonia, unspecified organism: Secondary | ICD-10-CM | POA: Diagnosis not present

## 2012-10-10 DIAGNOSIS — I472 Ventricular tachycardia: Secondary | ICD-10-CM | POA: Diagnosis present

## 2012-10-10 LAB — BASIC METABOLIC PANEL
CO2: 24 mEq/L (ref 19–32)
GFR calc non Af Amer: 15 mL/min — ABNORMAL LOW (ref >90)
Glucose, Bld: 135 mg/dL — ABNORMAL HIGH (ref 70–99)
Potassium: 4.5 mEq/L (ref 3.5–5.1)
Sodium: 140 mEq/L (ref 135–145)

## 2012-10-10 LAB — GLUCOSE, CAPILLARY: Glucose-Capillary: 124 mg/dL — ABNORMAL HIGH (ref 70–99)

## 2012-10-10 LAB — TROPONIN I: Troponin I: <0.3 ng/mL (ref <0.30)

## 2012-10-10 MED ORDER — ALUM & MAG HYDROXIDE-SIMETH 200-200-20 MG/5ML PO SUSP
30.0000 mL | ORAL | Status: DC | PRN
Start: 2012-10-10 — End: 2012-10-15

## 2012-10-10 MED ORDER — FLUCONAZOLE 100MG IVPB
100.0000 mg | INTRAVENOUS | Status: DC
  Administered 2012-10-10 – 2012-10-11 (×2): 100 mg via INTRAVENOUS
  Filled 2012-10-10 (×3): qty 50

## 2012-10-10 MED ORDER — LINEZOLID 2 MG/ML IV SOLN
600.0000 mg | Freq: Two times a day (BID) | INTRAVENOUS | Status: DC
  Administered 2012-10-10 – 2012-10-12 (×5): 600 mg via INTRAVENOUS
  Filled 2012-10-10 (×5): qty 300

## 2012-10-10 MED ORDER — SUCRALFATE 1 GM/10ML PO SUSP
1.0000 g | Freq: Three times a day (TID) | ORAL | Status: DC
  Administered 2012-10-10 – 2012-10-15 (×19): 1 g via ORAL
  Filled 2012-10-10 (×19): qty 10

## 2012-10-10 MED ORDER — NITROGLYCERIN 0.4 MG SL SUBL
SUBLINGUAL_TABLET | SUBLINGUAL | Status: AC
  Administered 2012-10-10: 07:00:00
  Filled 2012-10-10: qty 25

## 2012-10-10 MED ORDER — ALPRAZOLAM 0.5 MG PO TABS
0.5000 mg | ORAL_TABLET | Freq: Once | ORAL | Status: AC
  Administered 2012-10-10: 0.5 mg via ORAL

## 2012-10-10 MED ORDER — NITROGLYCERIN 0.4 MG SL SUBL
0.4000 mg | SUBLINGUAL_TABLET | SUBLINGUAL | Status: DC | PRN
  Administered 2012-10-10 – 2012-10-11 (×5): 0.4 mg via SUBLINGUAL

## 2012-10-10 NOTE — Progress Notes (Addendum)
Patient developed sudden midsternal chest pain 8/10. EKG was ordered that revealed no changes. Dr. Renard Matter notified and Stat Troponin ordered. Patient had 5 beat run of v tach 15 minutes after ekg was obtained. Nitro sublingual has been given x 2 with no relief at present time.

## 2012-10-10 NOTE — Progress Notes (Signed)
Subjective: Interval History: has complaints chest pain. Described as sharp, non-radiating and is admitted her  chest. It improved with sublingual nitroglycerin. Patient also states that she has some cough. She denies any shortness of breath..  Objective: Vital signs in last 24 hours: Temp:  [89.9 F (32.2 C)-98.4 F (36.9 C)] 89.9 F (32.2 C) (05/17 0555) Pulse Rate:  [78-95] 95 (05/17 0725) Resp:  [20] 20 (05/17 0555) BP: (96-122)/(51-64) 122/61 mmHg (05/17 0725) SpO2:  [90 %-97 %] 91 % (05/17 0406) Weight change:   Intake/Output from previous day: 05/16 0701 - 05/17 0700 In: 360 [P.O.:360] Out: 1300 [Urine:1300] Intake/Output this shift:    Generally she's alert in no apparent distress Chest decreased breath sound bilaterally patient doesn't have any rales rhonchi or egophony. Heart exam revealed irregular rate and rhythm. Extremities she has 1-2+ edema.  Lab Results: No results found for this basename: WBC, HGB, HCT, PLT,  in the last 72 hours BMET:  Recent Labs  10/09/12 0451 10/10/12 0536  NA 140 140  K 4.1 4.5  CL 107 108  CO2 22 24  GLUCOSE 148* 135*  BUN 40* 36*  CREATININE 3.63* 2.95*  CALCIUM 7.2* 7.6*   No results found for this basename: PTH,  in the last 72 hours Iron Studies: No results found for this basename: IRON, TIBC, TRANSFERRIN, FERRITIN,  in the last 72 hours  Studies/Results: No results found.  I have reviewed the patient's current medications.  Assessment/Plan: Problem #1 renal failure again his 36 creatinine 2.95 renal function progressively improving. Problem #2 chest pain. Patient was multiple risk factors with rule out cardiac origin. Problem #3 anasarca she is on diuretics with reasonable urine output patient seems to still significant sign of fluid overload. Problem #4 hypokalemia she is on potassium supplement potassium has corrected Problem #5 hypertension her blood pressure is reasonably controlled Problem #6 UTI presently  seems to be secondary to be VRE Problem #7 Adrenal insufficiency. Problem #8 anemia Plan: Change IV fluid to 25 cc per hour Continue with diuretics Patient may require cardiology consult Metabolic panel and CBC in the morning.    LOS: 4 days   Gavan Nordby S 10/10/2012,7:44 AM

## 2012-10-10 NOTE — Progress Notes (Signed)
Patient ID: Ann [name], female   DOB: 1946/04/16, 67 y.o.   MRN: [id number]  Events over the last 24 hours has been noted. I'll continue to follow her course of the weekend however at this time I feel placement of a Port-A-Cath is not urgent and only once patient is stable from her current course she would proceed. Placement may still a possible beginning the week depending patient's progress or weekend but again will bases on a day-to-day basis.  Patient is aware

## 2012-10-10 NOTE — Progress Notes (Signed)
Subjective: She had an episode of chest discomfort this morning and also had a 5 beat run of V. tach. EKG does not show any acute changes and she did receive nitroglycerin. Her husband says that she was upset by the fact that it was suggested that she go back to the nursing home and she does not want to do that. She is now awake and alert says she feels better. She has no new complaints. Her chest pain is atypical  Objective: Vital signs in last 24 hours: Temp:  [89.9 F (32.2 C)-98.4 F (36.9 C)] 89.9 F (32.2 C) (05/17 0555) Pulse Rate:  [78-95] 89 (05/17 0825) Resp:  [20-21] 21 (05/17 0825) BP: (96-146)/(51-72) 146/72 mmHg (05/17 0825) SpO2:  [90 %-97 %] 95 % (05/17 0825) Weight change:  Last BM Date: 10/10/12  Intake/Output from previous day: 05/16 0701 - 05/17 0700 In: 360 [P.O.:360] Out: 1300 [Urine:1300]  PHYSICAL EXAM General appearance: alert, cooperative, mild distress and morbidly obese Resp: clear to auscultation bilaterally Cardio: regular rate and rhythm, S1, S2 normal, no murmur, click, rub or gallop GI: soft, non-tender; bowel sounds normal; no masses,  no organomegaly Extremities: She still complains of pain in her arm but her right arm looks better  Lab Results:    Basic Metabolic Panel:  Recent Labs  16/10/96 0451 10/10/12 0536  NA 140 140  K 4.1 4.5  CL 107 108  CO2 22 24  GLUCOSE 148* 135*  BUN 40* 36*  CREATININE 3.63* 2.95*  CALCIUM 7.2* 7.6*   Liver Function Tests: No results found for this basename: AST, ALT, ALKPHOS, BILITOT, PROT, ALBUMIN,  in the last 72 hours No results found for this basename: LIPASE, AMYLASE,  in the last 72 hours No results found for this basename: AMMONIA,  in the last 72 hours CBC: No results found for this basename: WBC, NEUTROABS, HGB, HCT, MCV, PLT,  in the last 72 hours Cardiac Enzymes:  Recent Labs  10/10/12 0740  TROPONINI <0.30   BNP: No results found for this basename: PROBNP,  in the last 72  hours D-Dimer: No results found for this basename: DDIMER,  in the last 72 hours CBG:  Recent Labs  10/08/12 2114 10/09/12 0730 10/09/12 1134 10/09/12 1624 10/09/12 2003 10/10/12 0735  GLUCAP 276* 129* 143* 151* 123* 124*   Hemoglobin A1C: No results found for this basename: HGBA1C,  in the last 72 hours Fasting Lipid Panel: No results found for this basename: CHOL, HDL, LDLCALC, TRIG, CHOLHDL, LDLDIRECT,  in the last 72 hours Thyroid Function Tests: No results found for this basename: TSH, T4TOTAL, FREET4, T3FREE, THYROIDAB,  in the last 72 hours Anemia Panel: No results found for this basename: VITAMINB12, FOLATE, FERRITIN, TIBC, IRON, RETICCTPCT,  in the last 72 hours Coagulation: No results found for this basename: LABPROT, INR,  in the last 72 hours Urine Drug Screen: Drugs of Abuse  No results found for this basename: labopia, cocainscrnur, labbenz, amphetmu, thcu, labbarb    Alcohol Level: No results found for this basename: ETH,  in the last 72 hours Urinalysis: No results found for this basename: COLORURINE, APPERANCEUR, LABSPEC, PHURINE, GLUCOSEU, HGBUR, BILIRUBINUR, KETONESUR, PROTEINUR, UROBILINOGEN, NITRITE, LEUKOCYTESUR,  in the last 72 hours Misc. Labs:  ABGS No results found for this basename: PHART, PCO2, PO2ART, TCO2, HCO3,  in the last 72 hours CULTURES Recent Results (from the past 240 hour(s))  CULTURE, BLOOD (ROUTINE X 2)     Status: None   Collection Time  10/06/12  4:50 PM      Result Value Range Status   Specimen Description BLOOD RIGHT ANTECUBITAL   Final   Special Requests BOTTLES DRAWN AEROBIC AND ANAEROBIC 8CC   Final   Culture NO GROWTH 4 DAYS   Final   Report Status PENDING   Incomplete  CULTURE, BLOOD (ROUTINE X 2)     Status: None   Collection Time    10/06/12  4:55 PM      Result Value Range Status   Specimen Description BLOOD LEFT ANTECUBITAL   Final   Special Requests     Final   Value: BOTTLES DRAWN AEROBIC AND ANAEROBIC  AEB=15CC ANA=5CC   Culture NO GROWTH 4 DAYS   Final   Report Status PENDING   Incomplete  URINE CULTURE     Status: None   Collection Time    10/06/12  6:41 PM      Result Value Range Status   Specimen Description URINE, CATHETERIZED   Final   Special Requests NONE   Final   Culture  Setup Time 10/06/2012 19:00   Final   Colony Count >=100,000 COLONIES/ML   Final   Culture     Final   Value: VANCOMYCIN RESISTANT ENTEROCOCCUS ISOLATED     Note: CRITICAL RESULT CALLED TO, READ BACK BY AND VERIFIED WITH: Ann [name] @ 2031 ON 10/09/2012   Report Status 10/09/2012 FINAL   Final   Organism ID, Bacteria VANCOMYCIN RESISTANT ENTEROCOCCUS ISOLATED   Final   Studies/Results: No results found.  Medications:  Prior to Admission:  Prescriptions prior to admission  Medication Sig Dispense Refill  . albuterol (PROVENTIL HFA;VENTOLIN HFA) 108 (90 BASE) MCG/ACT inhaler Inhale 2 puffs into the lungs every 6 (six) hours as needed for wheezing.  1 Inhaler  2  . ALPRAZolam (XANAX) 0.5 MG tablet Take 0.5 mg by mouth 3 (three) times daily as needed for sleep.       Marland Kitchen aspirin 81 MG chewable tablet Chew 81 mg by mouth daily.      . calcitRIOL (ROCALTROL) 0.25 MCG capsule Take 0.25 mcg by mouth daily.      . digoxin (LANOXIN) 0.125 MG tablet Take 1 tablet (0.125 mg total) by mouth daily.  30 tablet  12  . diltiazem (CARDIZEM) 120 MG tablet Take 1 tablet (120 mg total) by mouth daily.  30 tablet  12  . fluticasone (FLONASE) 50 MCG/ACT nasal spray Place 2 sprays into the nose daily.      . Fluticasone-Salmeterol (ADVAIR DISKUS) 250-50 MCG/DOSE AEPB Inhale 1 puff into the lungs 2 (two) times daily.  60 each  12  . gabapentin (NEURONTIN) 100 MG capsule Take 100 mg by mouth 3 (three) times daily.      Marland Kitchen HYDROcodone-acetaminophen (NORCO) 5-325 MG per tablet Take 1 tablet by mouth every 6 (six) hours as needed for pain.  30 tablet  0  . insulin aspart (NOVOLOG) 100 UNIT/ML injection Inject 3-15 Units into the  skin 3 (three) times daily before meals. 121-150=3 units, 151-200=4 units, 201-250=7 units, 251-300=9 units, 301-350=12 units, 351-400=15 units.      . insulin detemir (LEVEMIR) 100 UNIT/ML injection Inject 50 Units into the skin at bedtime.       Marland Kitchen ipratropium (ATROVENT) 0.02 % nebulizer solution Take 2.5 mLs (500 mcg total) by nebulization 4 (four) times daily.  75 mL  12  . levalbuterol (XOPENEX) 0.63 MG/3ML nebulizer solution Take 3 mLs (0.63 mg total) by nebulization every 4 (four)  hours as needed for wheezing.  3 mL  12  . levothyroxine (SYNTHROID, LEVOTHROID) 200 MCG tablet Take 200 mcg by mouth daily before breakfast.      . ondansetron (ZOFRAN) 4 MG tablet Take 1 tablet (4 mg total) by mouth every 6 (six) hours as needed for nausea.  20 tablet  5  . pantoprazole (PROTONIX) 40 MG tablet Take 1 tablet (40 mg total) by mouth 2 (two) times daily before a meal.  60 tablet  12  . polyethylene glycol (MIRALAX / GLYCOLAX) packet Take 17 g by mouth daily.      . predniSONE (DELTASONE) 5 MG tablet Take 2 tablets (10 mg total) by mouth daily.  60 tablet  12  . rosuvastatin (CRESTOR) 40 MG tablet Take 1 tablet (40 mg total) by mouth daily.  30 tablet  12  . sertraline (ZOLOFT) 50 MG tablet Take 1 tablet (50 mg total) by mouth daily.  30 tablet  12  . tolterodine (DETROL LA) 4 MG 24 hr capsule Take 1 capsule (4 mg total) by mouth daily.  30 capsule  12  . torsemide (DEMADEX) 20 MG tablet Take 1 tablet (20 mg total) by mouth 2 (two) times daily.  30 tablet  12  . vitamin B-12 (CYANOCOBALAMIN) 1000 MCG tablet Take 1,000 mcg by mouth daily.      . metoCLOPramide (REGLAN) 10 MG tablet Take 1 tablet (10 mg total) by mouth 4 (four) times daily -  before meals and at bedtime.  90 tablet  12   Scheduled: . albuterol  2.5 mg Nebulization Q4H  . aspirin  81 mg Oral Daily  . atorvastatin  80 mg Oral q1800  . calcitRIOL  0.25 mcg Oral Daily  . digoxin  0.125 mg Oral Daily  . diltiazem  120 mg Oral Daily  .  fesoterodine  8 mg Oral Daily  . fluconazole (DIFLUCAN) IV  100 mg Intravenous Q24H  . fluticasone  2 spray Each Nare Daily  . furosemide  40 mg Intravenous BID  . gabapentin  100 mg Oral TID  . heparin  5,000 Units Subcutaneous Q8H  . hydrocortisone sod succinate (SOLU-CORTEF) inj  50 mg Intravenous BID  . insulin aspart  0-20 Units Subcutaneous TID WC  . insulin aspart  0-5 Units Subcutaneous QHS  . insulin aspart  3 Units Subcutaneous TID WC  . insulin detemir  55 Units Subcutaneous QHS  . ipratropium  500 mcg Nebulization QID  . levothyroxine  200 mcg Oral QAC breakfast  . linezolid  600 mg Intravenous Q12H  . metoCLOPramide  10 mg Oral TID AC & HS  . mometasone-formoterol  2 puff Inhalation BID  . pantoprazole  40 mg Oral BID AC  . polyethylene glycol  17 g Oral Daily  . sertraline  50 mg Oral Daily  . sodium chloride  3 mL Intravenous Q12H  . sucralfate  1 g Oral TID WC & HS  . vancomycin  1,500 mg Intravenous Q48H  . vitamin B-12  1,000 mcg Oral Daily   Continuous: . 0.9 % NaCl with KCl 20 mEq / L 75 mL/hr at 10/09/12 2217   EAV:[id number], ALPRAZolam, alum & mag hydroxide-simeth, HYDROcodone-acetaminophen, nitroGLYCERIN, ondansetron (ZOFRAN) IV, ondansetron, ondansetron  Assesment: She had an episode of chest pain and does have coronary artery occlusive disease. Her EKG did not show anything and troponin is negative at this point. She had Candida esophagitis previously and she's been back on antibiotics on going to resume treatment  for that. She has cellulitis of her right arm. Her urine culture has come back and she has vancomycin resistant enterococcus in her urine. She has acute on chronic renal failure and her renal function has improved. She has diabetes a little bit better controlled. She has adrenal insufficiency and is on IV steroids now. She had an episode of ventricular tachycardia which was nonsustained and has not recurred Principal Problem:   Cellulitis of arm,  right Active Problems:   HYPOTHYROIDISM   Edema   Diabetes mellitus type 2 with complications   Chronic diastolic heart failure   Adrenal insufficiency   ARF (acute renal failure)   Morbid obesity   Chronic kidney disease, stage III (moderate)   Atrial fibrillation   Acute renal failure (ARF)    Plan: I will add Zyvox. Start on Diflucan. Add Carafate. She will have serial cardiac enzymes and EKGs. I discussed with patient and her husband and they do not want nursing home placement    LOS: 4 days   Shanika Levings L 10/10/2012, 9:04 AM

## 2012-10-10 NOTE — Progress Notes (Signed)
PT RECEIVED TO ICU-11 FROM TELEMETRY. VSS. HR 80'S IN NSR. ALERT AND ORIENTED.

## 2012-10-10 NOTE — Progress Notes (Signed)
Patient and family report "knot" on L upper back that itches. Small nodule noted but only to palpation. Will pass on to next RN and continue to monitor.

## 2012-10-10 NOTE — Progress Notes (Signed)
Pt had a 39 beat run of V-tach at 2058. MD on call notified.  E-link made aware. No new orders given. Will continue to monitor. Heather Roberts 10/10/2012 9:14 PM

## 2012-10-11 DIAGNOSIS — I4891 Unspecified atrial fibrillation: Secondary | ICD-10-CM | POA: Diagnosis not present

## 2012-10-11 DIAGNOSIS — N179 Acute kidney failure, unspecified: Secondary | ICD-10-CM | POA: Diagnosis not present

## 2012-10-11 DIAGNOSIS — J189 Pneumonia, unspecified organism: Secondary | ICD-10-CM | POA: Diagnosis not present

## 2012-10-11 DIAGNOSIS — N170 Acute kidney failure with tubular necrosis: Secondary | ICD-10-CM | POA: Diagnosis not present

## 2012-10-11 DIAGNOSIS — IMO0002 Reserved for concepts with insufficient information to code with codable children: Secondary | ICD-10-CM | POA: Diagnosis not present

## 2012-10-11 DIAGNOSIS — D649 Anemia, unspecified: Secondary | ICD-10-CM | POA: Diagnosis not present

## 2012-10-11 DIAGNOSIS — N390 Urinary tract infection, site not specified: Secondary | ICD-10-CM | POA: Diagnosis not present

## 2012-10-11 DIAGNOSIS — I472 Ventricular tachycardia: Secondary | ICD-10-CM | POA: Diagnosis not present

## 2012-10-11 LAB — BASIC METABOLIC PANEL
CO2: 21 mEq/L (ref 19–32)
Calcium: 8.3 mg/dL — ABNORMAL LOW (ref 8.4–10.5)
Chloride: 107 mEq/L (ref 96–112)
Creatinine, Ser: 2.28 mg/dL — ABNORMAL HIGH (ref 0.50–1.10)
Glucose, Bld: 183 mg/dL — ABNORMAL HIGH (ref 70–99)

## 2012-10-11 LAB — CULTURE, BLOOD (ROUTINE X 2)
Culture: NO GROWTH
Culture: NO GROWTH

## 2012-10-11 LAB — GLUCOSE, CAPILLARY
Glucose-Capillary: 157 mg/dL — ABNORMAL HIGH (ref 70–99)
Glucose-Capillary: 139 mg/dL — ABNORMAL HIGH (ref 70–99)

## 2012-10-11 LAB — CBC
HCT: 30.1 % — ABNORMAL LOW (ref 36.0–46.0)
MCH: 32 pg (ref 26.0–34.0)
MCV: 101.3 fL — ABNORMAL HIGH (ref 78.0–100.0)
Platelets: 226 10*3/uL (ref 150–400)
RDW: 15.8 % — ABNORMAL HIGH (ref 11.5–15.5)
WBC: 6.5 10*3/uL (ref 4.0–10.5)

## 2012-10-11 LAB — CLOSTRIDIUM DIFFICILE BY PCR: Toxigenic C. Difficile by PCR: POSITIVE — AB

## 2012-10-11 MED ORDER — LOPERAMIDE HCL 2 MG PO CAPS
2.0000 mg | ORAL_CAPSULE | ORAL | Status: DC | PRN
  Administered 2012-10-12 – 2012-10-15 (×6): 2 mg via ORAL
  Filled 2012-10-11 (×7): qty 1

## 2012-10-11 NOTE — Progress Notes (Signed)
Subjective: She had another episode of V. tach he was asymptomatic. This may actually be atrial fibrillation with aberrancy. She has no other new complaints. She says she feels much better in general.  Objective: Vital signs in last 24 hours: Temp:  [97.5 F (36.4 C)-98 F (36.7 C)] 98 F (36.7 C) (05/18 0400) Pulse Rate:  [72-90] 83 (05/18 0700) Resp:  [11-21] 15 (05/18 0700) BP: (87-151)/(44-88) 132/68 mmHg (05/18 0700) SpO2:  [91 %-98 %] 93 % (05/18 0700) FiO2 (%):  [93 %] 93 % (05/17 1239) Weight:  [136.3 kg (300 lb 7.8 oz)] 136.3 kg (300 lb 7.8 oz) (05/18 0500) Weight change:  Last BM Date: 10/10/12  Intake/Output from previous day: 05/17 0701 - 05/18 0700 In: 1070 [P.O.:480; I.V.:240; IV Piggyback:350] Out: 1550 [Urine:1550]  PHYSICAL EXAM General appearance: alert, cooperative, mild distress and morbidly obese Resp: clear to auscultation bilaterally Cardio: irregularly irregular rhythm GI: soft, non-tender; bowel sounds normal; no masses,  no organomegaly Extremities: extremities normal, atraumatic, no cyanosis or edema  Lab Results:    Basic Metabolic Panel:  Recent Labs  95/62/13 0536 10/11/12 0118  NA 140 140  K 4.5 4.1  CL 108 107  CO2 24 21  GLUCOSE 135* 183*  BUN 36* 34*  CREATININE 2.95* 2.28*  CALCIUM 7.6* 8.3*   Liver Function Tests: No results found for this basename: AST, ALT, ALKPHOS, BILITOT, PROT, ALBUMIN,  in the last 72 hours No results found for this basename: LIPASE, AMYLASE,  in the last 72 hours No results found for this basename: AMMONIA,  in the last 72 hours CBC:  Recent Labs  10/11/12 0118  WBC 6.5  HGB 9.5*  HCT 30.1*  MCV 101.3*  PLT 226   Cardiac Enzymes:  Recent Labs  10/10/12 1340 10/10/12 1943 10/11/12 0118  TROPONINI <0.30 <0.30 <0.30   BNP: No results found for this basename: PROBNP,  in the last 72 hours D-Dimer: No results found for this basename: DDIMER,  in the last 72 hours CBG:  Recent Labs  10/09/12 1624 10/09/12 2003 10/10/12 0735 10/10/12 1120 10/10/12 1654 10/10/12 2130  GLUCAP 151* 123* 124* 122* 155* 166*   Hemoglobin A1C: No results found for this basename: HGBA1C,  in the last 72 hours Fasting Lipid Panel: No results found for this basename: CHOL, HDL, LDLCALC, TRIG, CHOLHDL, LDLDIRECT,  in the last 72 hours Thyroid Function Tests: No results found for this basename: TSH, T4TOTAL, FREET4, T3FREE, THYROIDAB,  in the last 72 hours Anemia Panel: No results found for this basename: VITAMINB12, FOLATE, FERRITIN, TIBC, IRON, RETICCTPCT,  in the last 72 hours Coagulation: No results found for this basename: LABPROT, INR,  in the last 72 hours Urine Drug Screen: Drugs of Abuse  No results found for this basename: labopia, cocainscrnur, labbenz, amphetmu, thcu, labbarb    Alcohol Level: No results found for this basename: ETH,  in the last 72 hours Urinalysis: No results found for this basename: COLORURINE, APPERANCEUR, LABSPEC, PHURINE, GLUCOSEU, HGBUR, BILIRUBINUR, KETONESUR, PROTEINUR, UROBILINOGEN, NITRITE, LEUKOCYTESUR,  in the last 72 hours Misc. Labs:  ABGS No results found for this basename: PHART, PCO2, PO2ART, TCO2, HCO3,  in the last 72 hours CULTURES Recent Results (from the past 240 hour(s))  CULTURE, BLOOD (ROUTINE X 2)     Status: None   Collection Time    10/06/12  4:50 PM      Result Value Range Status   Specimen Description BLOOD RIGHT ANTECUBITAL   Final   Special Requests  BOTTLES DRAWN AEROBIC AND ANAEROBIC 8CC   Final   Culture NO GROWTH 5 DAYS   Final   Report Status 10/11/2012 FINAL   Final  CULTURE, BLOOD (ROUTINE X 2)     Status: None   Collection Time    10/06/12  4:55 PM      Result Value Range Status   Specimen Description BLOOD LEFT ANTECUBITAL   Final   Special Requests     Final   Value: BOTTLES DRAWN AEROBIC AND ANAEROBIC AEB=15CC ANA=5CC   Culture NO GROWTH 5 DAYS   Final   Report Status 10/11/2012 FINAL   Final  URINE  CULTURE     Status: None   Collection Time    10/06/12  6:41 PM      Result Value Range Status   Specimen Description URINE, CATHETERIZED   Final   Special Requests NONE   Final   Culture  Setup Time 10/06/2012 19:00   Final   Colony Count >=100,000 COLONIES/ML   Final   Culture     Final   Value: VANCOMYCIN RESISTANT ENTEROCOCCUS ISOLATED     Note: CRITICAL RESULT CALLED TO, READ BACK BY AND VERIFIED WITH: MELANE SIMPSON @ 2031 ON 10/09/2012   Report Status 10/09/2012 FINAL   Final   Organism ID, Bacteria VANCOMYCIN RESISTANT ENTEROCOCCUS ISOLATED   Final   Studies/Results: No results found.  Medications:  Prior to Admission:  Prescriptions prior to admission  Medication Sig Dispense Refill  . albuterol (PROVENTIL HFA;VENTOLIN HFA) 108 (90 BASE) MCG/ACT inhaler Inhale 2 puffs into the lungs every 6 (six) hours as needed for wheezing.  1 Inhaler  2  . ALPRAZolam (XANAX) 0.5 MG tablet Take 0.5 mg by mouth 3 (three) times daily as needed for sleep.       Marland Kitchen aspirin 81 MG chewable tablet Chew 81 mg by mouth daily.      . calcitRIOL (ROCALTROL) 0.25 MCG capsule Take 0.25 mcg by mouth daily.      . digoxin (LANOXIN) 0.125 MG tablet Take 1 tablet (0.125 mg total) by mouth daily.  30 tablet  12  . diltiazem (CARDIZEM) 120 MG tablet Take 1 tablet (120 mg total) by mouth daily.  30 tablet  12  . fluticasone (FLONASE) 50 MCG/ACT nasal spray Place 2 sprays into the nose daily.      . Fluticasone-Salmeterol (ADVAIR DISKUS) 250-50 MCG/DOSE AEPB Inhale 1 puff into the lungs 2 (two) times daily.  60 each  12  . gabapentin (NEURONTIN) 100 MG capsule Take 100 mg by mouth 3 (three) times daily.      Marland Kitchen HYDROcodone-acetaminophen (NORCO) 5-325 MG per tablet Take 1 tablet by mouth every 6 (six) hours as needed for pain.  30 tablet  0  . insulin aspart (NOVOLOG) 100 UNIT/ML injection Inject 3-15 Units into the skin 3 (three) times daily before meals. 121-150=3 units, 151-200=4 units, 201-250=7 units,  251-300=9 units, 301-350=12 units, 351-400=15 units.      . insulin detemir (LEVEMIR) 100 UNIT/ML injection Inject 50 Units into the skin at bedtime.       Marland Kitchen ipratropium (ATROVENT) 0.02 % nebulizer solution Take 2.5 mLs (500 mcg total) by nebulization 4 (four) times daily.  75 mL  12  . levalbuterol (XOPENEX) 0.63 MG/3ML nebulizer solution Take 3 mLs (0.63 mg total) by nebulization every 4 (four) hours as needed for wheezing.  3 mL  12  . levothyroxine (SYNTHROID, LEVOTHROID) 200 MCG tablet Take 200 mcg by mouth daily before  breakfast.      . ondansetron (ZOFRAN) 4 MG tablet Take 1 tablet (4 mg total) by mouth every 6 (six) hours as needed for nausea.  20 tablet  5  . pantoprazole (PROTONIX) 40 MG tablet Take 1 tablet (40 mg total) by mouth 2 (two) times daily before a meal.  60 tablet  12  . polyethylene glycol (MIRALAX / GLYCOLAX) packet Take 17 g by mouth daily.      . predniSONE (DELTASONE) 5 MG tablet Take 2 tablets (10 mg total) by mouth daily.  60 tablet  12  . rosuvastatin (CRESTOR) 40 MG tablet Take 1 tablet (40 mg total) by mouth daily.  30 tablet  12  . sertraline (ZOLOFT) 50 MG tablet Take 1 tablet (50 mg total) by mouth daily.  30 tablet  12  . tolterodine (DETROL LA) 4 MG 24 hr capsule Take 1 capsule (4 mg total) by mouth daily.  30 capsule  12  . torsemide (DEMADEX) 20 MG tablet Take 1 tablet (20 mg total) by mouth 2 (two) times daily.  30 tablet  12  . vitamin B-12 (CYANOCOBALAMIN) 1000 MCG tablet Take 1,000 mcg by mouth daily.      . metoCLOPramide (REGLAN) 10 MG tablet Take 1 tablet (10 mg total) by mouth 4 (four) times daily -  before meals and at bedtime.  90 tablet  12   Scheduled: . albuterol  2.5 mg Nebulization Q4H  . aspirin  81 mg Oral Daily  . atorvastatin  80 mg Oral q1800  . calcitRIOL  0.25 mcg Oral Daily  . digoxin  0.125 mg Oral Daily  . diltiazem  120 mg Oral Daily  . fesoterodine  8 mg Oral Daily  . fluconazole (DIFLUCAN) IV  100 mg Intravenous Q24H  .  fluticasone  2 spray Each Nare Daily  . furosemide  40 mg Intravenous BID  . gabapentin  100 mg Oral TID  . heparin  5,000 Units Subcutaneous Q8H  . hydrocortisone sod succinate (SOLU-CORTEF) inj  50 mg Intravenous BID  . insulin aspart  0-20 Units Subcutaneous TID WC  . insulin aspart  0-5 Units Subcutaneous QHS  . insulin aspart  3 Units Subcutaneous TID WC  . insulin detemir  55 Units Subcutaneous QHS  . ipratropium  500 mcg Nebulization QID  . levothyroxine  200 mcg Oral QAC breakfast  . linezolid  600 mg Intravenous Q12H  . metoCLOPramide  10 mg Oral TID AC & HS  . mometasone-formoterol  2 puff Inhalation BID  . pantoprazole  40 mg Oral BID AC  . polyethylene glycol  17 g Oral Daily  . sertraline  50 mg Oral Daily  . sodium chloride  3 mL Intravenous Q12H  . sucralfate  1 g Oral TID WC & HS  . vitamin B-12  1,000 mcg Oral Daily   Continuous: . 0.9 % NaCl with KCl 20 mEq / [name] 10 mL/hr at 10/11/12 0700   VOZ:[id number], ALPRAZolam, alum & mag hydroxide-simeth, HYDROcodone-acetaminophen, nitroGLYCERIN, ondansetron (ZOFRAN) IV, ondansetron, ondansetron  Assesment: She was admitted with cellulitis of her arm. She has vancomycin resistant enterococcus in her urine. She has had nonsustained ventricular tachycardia which is asymptomatic. She had chest pain yesterday but that has resolved. She has chronic atrial fibrillation. She has acute on chronic renal failure which is improving. She has adrenal insufficiency which is being treated with IV steroids. She has poor IV access and we are attempting to get a Port-A-Cath but that has  been delayed because of her medical problems. Principal Problem:   Cellulitis of arm, right Active Problems:   HYPOTHYROIDISM   Edema   Diabetes mellitus type 2 with complications   Chronic diastolic heart failure   Adrenal insufficiency   ARF (acute renal failure)   Morbid obesity   Chronic kidney disease, stage III (moderate)   Atrial fibrillation   UTI  (urinary tract infection)   Acute renal failure (ARF)   VRE (vancomycin resistant enterococcus) culture positive   Chest pain   Nonsustained ventricular tachycardia    Plan: I'm going to ask for cardiology consultation for help with the arrhythmia. If she is cleared from cardiology she could probably go ahead with Port-A-Cath placement. Her renal function is much better. She's no longer complaining of any chest discomfort.    LOS: 5 days   Ann [name] 10/11/2012, 7:44 AM

## 2012-10-11 NOTE — Progress Notes (Signed)
Educated patient and family about c-diff and importance of good hand hygiene, prevention and how it is contracted.

## 2012-10-11 NOTE — Progress Notes (Signed)
Patient noted to have 3 loose stools during this shift alone. Placed on enteric precautions and will obtain stool specimen for C-diff with next loose stool.

## 2012-10-11 NOTE — Progress Notes (Signed)
Ann [name]  MRN: [id number]  DOB/AGE: Mar 24, 1946 67 y.o.  Primary Care Physician:HAWKINS,EDWARD L, MD  Admit date: 10/06/2012  Chief Complaint:  Chief Complaint  Patient presents with  . Shortness of Breath    S-Pt presented on  10/06/2012 with  Chief Complaint  Patient presents with  . Shortness of Breath  .    Pt today feels better than before.    NO c/o chest pain.   Meds . albuterol  2.5 mg Nebulization Q4H  . aspirin  81 mg Oral Daily  . atorvastatin  80 mg Oral q1800  . calcitRIOL  0.25 mcg Oral Daily  . digoxin  0.125 mg Oral Daily  . diltiazem  120 mg Oral Daily  . fesoterodine  8 mg Oral Daily  . fluconazole (DIFLUCAN) IV  100 mg Intravenous Q24H  . fluticasone  2 spray Each Nare Daily  . furosemide  40 mg Intravenous BID  . gabapentin  100 mg Oral TID  . heparin  5,000 Units Subcutaneous Q8H  . hydrocortisone sod succinate (SOLU-CORTEF) inj  50 mg Intravenous BID  . insulin aspart  0-20 Units Subcutaneous TID WC  . insulin aspart  0-5 Units Subcutaneous QHS  . insulin aspart  3 Units Subcutaneous TID WC  . insulin detemir  55 Units Subcutaneous QHS  . ipratropium  500 mcg Nebulization QID  . levothyroxine  200 mcg Oral QAC breakfast  . linezolid  600 mg Intravenous Q12H  . metoCLOPramide  10 mg Oral TID AC & HS  . mometasone-formoterol  2 puff Inhalation BID  . pantoprazole  40 mg Oral BID AC  . polyethylene glycol  17 g Oral Daily  . sertraline  50 mg Oral Daily  . sodium chloride  3 mL Intravenous Q12H  . sucralfate  1 g Oral TID WC & HS  . vitamin B-12  1,000 mcg Oral Daily      Physical Exam: Vital signs in last 24 hours: Temp:  [97.5 F (36.4 C)-98 F (36.7 C)] 98 F (36.7 C) (05/18 0400) Pulse Rate:  [72-90] 86 (05/18 0800) Resp:  [9-27] 16 (05/18 0800) BP: (87-151)/(44-88) 143/83 mmHg (05/18 0800) SpO2:  [91 %-99 %] 98 % (05/18 0818) FiO2 (%):  [93 %-98 %] 98 % (05/18 0824) Weight:  [300 lb 7.8 oz (136.3 kg)] 300 lb 7.8 oz (136.3  kg) (05/18 0500) Weight change:  Last BM Date: 10/11/12  Intake/Output from previous day: 05/17 0701 - 05/18 0700 In: 1070 [P.O.:480; I.V.:240; IV Piggyback:350] Out: 1550 [Urine:1550] Total I/O In: 10 [I.V.:10] Out: -  No recorded   Physical Exam: General- pt is awake,alert, oriented to time place and person Resp- No acute REsp distress, Decreased Bs at bases. CVS- S1S2 regular in rate and rhythm GIT- BS+, soft, NT, ND, Morbidly obese. EXT- Trace LE Edema, NO Cyanosis.   Lab Results: CBC  Recent Labs  10/11/12 0118  WBC 6.5  HGB 9.5*  HCT 30.1*  PLT 226    BMET  Recent Labs  10/10/12 0536 10/11/12 0118  NA 140 140  K 4.5 4.1  CL 108 107  CO2 24 21  GLUCOSE 135* 183*  BUN 36* 34*  CREATININE 2.95* 2.28*  CALCIUM 7.6* 8.3*    Trend Creat  2014 0.9==>4.0==>4.5 ==>4.3==>3.63==>2.95==>2.28 AKI in March 3.6==>1.5  AKi in Feb 2.5==>1.0   MICRO Recent Results (from the past 240 hour(s))  CULTURE, BLOOD (ROUTINE X 2)     Status: None   Collection Time  10/06/12  4:50 PM      Result Value Range Status   Specimen Description BLOOD RIGHT ANTECUBITAL   Final   Special Requests BOTTLES DRAWN AEROBIC AND ANAEROBIC 8CC   Final   Culture NO GROWTH 5 DAYS   Final   Report Status 10/11/2012 FINAL   Final  CULTURE, BLOOD (ROUTINE X 2)     Status: None   Collection Time    10/06/12  4:55 PM      Result Value Range Status   Specimen Description BLOOD LEFT ANTECUBITAL   Final   Special Requests     Final   Value: BOTTLES DRAWN AEROBIC AND ANAEROBIC AEB=15CC ANA=5CC   Culture NO GROWTH 5 DAYS   Final   Report Status 10/11/2012 FINAL   Final  URINE CULTURE     Status: None   Collection Time    10/06/12  6:41 PM      Result Value Range Status   Specimen Description URINE, CATHETERIZED   Final   Special Requests NONE   Final   Culture  Setup Time 10/06/2012 19:00   Final   Colony Count >=100,000 COLONIES/ML   Final   Culture     Final   Value: VANCOMYCIN  RESISTANT ENTEROCOCCUS ISOLATED     Note: CRITICAL RESULT CALLED TO, READ BACK BY AND VERIFIED WITH: MELANE SIMPSON @ 2031 ON 10/09/2012   Report Status 10/09/2012 FINAL   Final   Organism ID, Bacteria VANCOMYCIN RESISTANT ENTEROCOCCUS ISOLATED   Final      Lab Results  Component Value Date   PTH 177.0* 06/28/2012   CALCIUM 8.3* 10/11/2012   PHOS 2.7 08/04/2012        Impression: 1)Renal AKI secondary to Prerenal/ ATN  AKI on CKD AKI most likely Prerenal AKI now much better. Pt most likley has CKD as CT scan  has shown hydro earlier.   2)CVs Afib            Run of  [facility]            Cardiology being consulted.  3)Anemia HGb at goal (9--11)    4)CKD Mineral-Bone Disorder  PTH elevated .  Secondary Hyperparathyroidism Present.  Phosphorus at goal.   5)ID-admitted with Cellulitis /UTI On ABX- Linezolid   6)FEN  Was Hypokalemic now better  NOrmonatremic   7)Acid base  Co2 at goal     Plan:  Will continue current plan of treatment .     Latron Ribas S 10/11/2012, 9:59 AM

## 2012-10-11 NOTE — Progress Notes (Signed)
At ~1338 monitor alarmed v-tach. 5 beats noted. Checked on patient who complained of CP 8/10 pressure. Two nitro given at 5 min intervals with little relief. Stat EKG obtained which showed A-fib. Third nitro given with no relief. Dr. Renard Matter notified of my actions and reccommended continued monitoring and to give patient Vicodin and Xanax. Will continue to monitor.

## 2012-10-11 NOTE — Progress Notes (Signed)
CRITICAL VALUE ALERT  Critical value received: C-Diff +  Date of notification:  10/11/2012  Time of notification:  1615  Critical value read back:yes  Nurse who received alert:  M.Toler,RN  MD notified (1st page):  McInnis  Time of first page:  1625  MD notified (2nd page):  Time of second page:  Responding MD:  Renard Matter  Time MD responded:  [phone]

## 2012-10-11 NOTE — Progress Notes (Signed)
No MD on for cardiology. Dr. Dietrich Pates added to treatment team. Will pass on to next shift RN to call in AM.

## 2012-10-12 DIAGNOSIS — I4891 Unspecified atrial fibrillation: Secondary | ICD-10-CM

## 2012-10-12 DIAGNOSIS — E2749 Other adrenocortical insufficiency: Secondary | ICD-10-CM | POA: Diagnosis not present

## 2012-10-12 DIAGNOSIS — R609 Edema, unspecified: Secondary | ICD-10-CM

## 2012-10-12 DIAGNOSIS — D649 Anemia, unspecified: Secondary | ICD-10-CM | POA: Diagnosis not present

## 2012-10-12 DIAGNOSIS — N189 Chronic kidney disease, unspecified: Secondary | ICD-10-CM

## 2012-10-12 DIAGNOSIS — N390 Urinary tract infection, site not specified: Secondary | ICD-10-CM | POA: Diagnosis not present

## 2012-10-12 DIAGNOSIS — N179 Acute kidney failure, unspecified: Secondary | ICD-10-CM | POA: Diagnosis not present

## 2012-10-12 DIAGNOSIS — I5033 Acute on chronic diastolic (congestive) heart failure: Secondary | ICD-10-CM | POA: Diagnosis not present

## 2012-10-12 DIAGNOSIS — I2510 Atherosclerotic heart disease of native coronary artery without angina pectoris: Secondary | ICD-10-CM

## 2012-10-12 LAB — GLUCOSE, CAPILLARY
Glucose-Capillary: 90 mg/dL (ref 70–99)
Glucose-Capillary: 161 mg/dL — ABNORMAL HIGH (ref 70–99)
Glucose-Capillary: 108 mg/dL — ABNORMAL HIGH (ref 70–99)
Glucose-Capillary: 122 mg/dL — ABNORMAL HIGH (ref 70–99)

## 2012-10-12 MED ORDER — FUROSEMIDE 10 MG/ML IJ SOLN
80.0000 mg | Freq: Two times a day (BID) | INTRAMUSCULAR | Status: DC
  Administered 2012-10-12: 80 mg via INTRAVENOUS
  Filled 2012-10-12: qty 8

## 2012-10-12 MED ORDER — METRONIDAZOLE 500 MG PO TABS
500.0000 mg | ORAL_TABLET | Freq: Three times a day (TID) | ORAL | Status: DC
  Administered 2012-10-12 – 2012-10-15 (×11): 500 mg via ORAL
  Filled 2012-10-12 (×12): qty 1

## 2012-10-12 MED ORDER — FUROSEMIDE 10 MG/ML IJ SOLN
10.0000 mg/h | INTRAVENOUS | Status: DC
  Administered 2012-10-12: 10 mg/h via INTRAVENOUS
  Filled 2012-10-12 (×3): qty 25

## 2012-10-12 MED ORDER — LINEZOLID 600 MG PO TABS
600.0000 mg | ORAL_TABLET | Freq: Two times a day (BID) | ORAL | Status: DC
  Administered 2012-10-12 – 2012-10-15 (×6): 600 mg via ORAL
  Filled 2012-10-12 (×8): qty 1

## 2012-10-12 MED ORDER — FLUCONAZOLE 100 MG PO TABS
100.0000 mg | ORAL_TABLET | Freq: Every day | ORAL | Status: DC
  Administered 2012-10-12 – 2012-10-15 (×4): 100 mg via ORAL
  Filled 2012-10-12 (×4): qty 1

## 2012-10-12 NOTE — Progress Notes (Signed)
Subjective: She says she feels fairly well. She is positive for C. Difficile. She denies any chest discomfort now. She's not nauseated.  Objective: Vital signs in last 24 hours: Temp:  [97.9 F (36.6 C)-98.8 F (37.1 C)] 97.9 F (36.6 C) (05/19 0730) Pulse Rate:  [73-96] 81 (05/19 0730) Resp:  [11-19] 13 (05/19 0730) BP: (110-151)/(60-84) 139/67 mmHg (05/19 0700) SpO2:  [91 %-99 %] 95 % (05/19 0804) FiO2 (%):  [98 %] 98 % (05/18 0824) Weight:  [136.9 kg (301 lb 13 oz)] 136.9 kg (301 lb 13 oz) (05/19 0500) Weight change: 0.6 kg (1 lb 5.2 oz) Last BM Date: 10/11/12  Intake/Output from previous day: 05/18 0701 - 05/19 0700 In: 1180 [I.V.:230; IV Piggyback:950] Out: 1050 [Urine:1050]  PHYSICAL EXAM General appearance: alert, cooperative, mild distress and morbidly obese Resp: clear to auscultation bilaterally Cardio: regular rate and rhythm, S1, S2 normal, no murmur, click, rub or gallop GI: soft, non-tender; bowel sounds normal; no masses,  no organomegaly Extremities: She has chronic venous stasis multiple skin lesions and trace  Lab Results:    Basic Metabolic Panel:  Recent Labs  16/10/96 0536 10/11/12 0118  NA 140 140  K 4.5 4.1  CL 108 107  CO2 24 21  GLUCOSE 135* 183*  BUN 36* 34*  CREATININE 2.95* 2.28*  CALCIUM 7.6* 8.3*   Liver Function Tests: No results found for this basename: AST, ALT, ALKPHOS, BILITOT, PROT, ALBUMIN,  in the last 72 hours No results found for this basename: LIPASE, AMYLASE,  in the last 72 hours No results found for this basename: AMMONIA,  in the last 72 hours CBC:  Recent Labs  10/11/12 0118  WBC 6.5  HGB 9.5*  HCT 30.1*  MCV 101.3*  PLT 226   Cardiac Enzymes:  Recent Labs  10/10/12 1340 10/10/12 1943 10/11/12 0118  TROPONINI <0.30 <0.30 <0.30   BNP: No results found for this basename: PROBNP,  in the last 72 hours D-Dimer: No results found for this basename: DDIMER,  in the last 72 hours CBG:  Recent Labs  10/10/12 1654 10/10/12 2130 10/11/12 0745 10/11/12 1114 10/11/12 1616 10/11/12 2135  GLUCAP 155* 166* 116* 150* 157* 139*   Hemoglobin A1C: No results found for this basename: HGBA1C,  in the last 72 hours Fasting Lipid Panel: No results found for this basename: CHOL, HDL, LDLCALC, TRIG, CHOLHDL, LDLDIRECT,  in the last 72 hours Thyroid Function Tests: No results found for this basename: TSH, T4TOTAL, FREET4, T3FREE, THYROIDAB,  in the last 72 hours Anemia Panel: No results found for this basename: VITAMINB12, FOLATE, FERRITIN, TIBC, IRON, RETICCTPCT,  in the last 72 hours Coagulation: No results found for this basename: LABPROT, INR,  in the last 72 hours Urine Drug Screen: Drugs of Abuse  No results found for this basename: labopia, cocainscrnur, labbenz, amphetmu, thcu, labbarb    Alcohol Level: No results found for this basename: ETH,  in the last 72 hours Urinalysis: No results found for this basename: COLORURINE, APPERANCEUR, LABSPEC, PHURINE, GLUCOSEU, HGBUR, BILIRUBINUR, KETONESUR, PROTEINUR, UROBILINOGEN, NITRITE, LEUKOCYTESUR,  in the last 72 hours Misc. Labs:  ABGS No results found for this basename: PHART, PCO2, PO2ART, TCO2, HCO3,  in the last 72 hours CULTURES Recent Results (from the past 240 hour(s))  CULTURE, BLOOD (ROUTINE X 2)     Status: None   Collection Time    10/06/12  4:50 PM      Result Value Range Status   Specimen Description BLOOD RIGHT ANTECUBITAL  Final   Special Requests BOTTLES DRAWN AEROBIC AND ANAEROBIC 8CC   Final   Culture NO GROWTH 5 DAYS   Final   Report Status 10/11/2012 FINAL   Final  CULTURE, BLOOD (ROUTINE X 2)     Status: None   Collection Time    10/06/12  4:55 PM      Result Value Range Status   Specimen Description BLOOD LEFT ANTECUBITAL   Final   Special Requests     Final   Value: BOTTLES DRAWN AEROBIC AND ANAEROBIC AEB=15CC ANA=5CC   Culture NO GROWTH 5 DAYS   Final   Report Status 10/11/2012 FINAL   Final  URINE  CULTURE     Status: None   Collection Time    10/06/12  6:41 PM      Result Value Range Status   Specimen Description URINE, CATHETERIZED   Final   Special Requests NONE   Final   Culture  Setup Time 10/06/2012 19:00   Final   Colony Count >=100,000 COLONIES/ML   Final   Culture     Final   Value: VANCOMYCIN RESISTANT ENTEROCOCCUS ISOLATED     Note: CRITICAL RESULT CALLED TO, READ BACK BY AND VERIFIED WITH: MELANE SIMPSON @ 2031 ON 10/09/2012   Report Status 10/09/2012 FINAL   Final   Organism ID, Bacteria VANCOMYCIN RESISTANT ENTEROCOCCUS ISOLATED   Final  CLOSTRIDIUM DIFFICILE BY PCR     Status: Abnormal   Collection Time    10/11/12  2:30 PM      Result Value Range Status   C difficile by pcr POSITIVE (*) NEGATIVE Final   Comment: CRITICAL RESULT CALLED TO, READ BACK BY AND VERIFIED WITH:     TOLER M. AT 1617 ON [id number] BY THOMPSON S.   Studies/Results: No results found.  Medications:  Prior to Admission:  Prescriptions prior to admission  Medication Sig Dispense Refill  . albuterol (PROVENTIL HFA;VENTOLIN HFA) 108 (90 BASE) MCG/ACT inhaler Inhale 2 puffs into the lungs every 6 (six) hours as needed for wheezing.  1 Inhaler  2  . ALPRAZolam (XANAX) 0.5 MG tablet Take 0.5 mg by mouth 3 (three) times daily as needed for sleep.       Marland Kitchen aspirin 81 MG chewable tablet Chew 81 mg by mouth daily.      . calcitRIOL (ROCALTROL) 0.25 MCG capsule Take 0.25 mcg by mouth daily.      . digoxin (LANOXIN) 0.125 MG tablet Take 1 tablet (0.125 mg total) by mouth daily.  30 tablet  12  . diltiazem (CARDIZEM) 120 MG tablet Take 1 tablet (120 mg total) by mouth daily.  30 tablet  12  . fluticasone (FLONASE) 50 MCG/ACT nasal spray Place 2 sprays into the nose daily.      . Fluticasone-Salmeterol (ADVAIR DISKUS) 250-50 MCG/DOSE AEPB Inhale 1 puff into the lungs 2 (two) times daily.  60 each  12  . gabapentin (NEURONTIN) 100 MG capsule Take 100 mg by mouth 3 (three) times daily.      Marland Kitchen  HYDROcodone-acetaminophen (NORCO) 5-325 MG per tablet Take 1 tablet by mouth every 6 (six) hours as needed for pain.  30 tablet  0  . insulin aspart (NOVOLOG) 100 UNIT/ML injection Inject 3-15 Units into the skin 3 (three) times daily before meals. 121-150=3 units, 151-200=4 units, 201-250=7 units, 251-300=9 units, 301-350=12 units, 351-400=15 units.      . insulin detemir (LEVEMIR) 100 UNIT/ML injection Inject 50 Units into the skin at bedtime.       Marland Kitchen  ipratropium (ATROVENT) 0.02 % nebulizer solution Take 2.5 mLs (500 mcg total) by nebulization 4 (four) times daily.  75 mL  12  . levalbuterol (XOPENEX) 0.63 MG/3ML nebulizer solution Take 3 mLs (0.63 mg total) by nebulization every 4 (four) hours as needed for wheezing.  3 mL  12  . levothyroxine (SYNTHROID, LEVOTHROID) 200 MCG tablet Take 200 mcg by mouth daily before breakfast.      . ondansetron (ZOFRAN) 4 MG tablet Take 1 tablet (4 mg total) by mouth every 6 (six) hours as needed for nausea.  20 tablet  5  . pantoprazole (PROTONIX) 40 MG tablet Take 1 tablet (40 mg total) by mouth 2 (two) times daily before a meal.  60 tablet  12  . polyethylene glycol (MIRALAX / GLYCOLAX) packet Take 17 g by mouth daily.      . predniSONE (DELTASONE) 5 MG tablet Take 2 tablets (10 mg total) by mouth daily.  60 tablet  12  . rosuvastatin (CRESTOR) 40 MG tablet Take 1 tablet (40 mg total) by mouth daily.  30 tablet  12  . sertraline (ZOLOFT) 50 MG tablet Take 1 tablet (50 mg total) by mouth daily.  30 tablet  12  . tolterodine (DETROL LA) 4 MG 24 hr capsule Take 1 capsule (4 mg total) by mouth daily.  30 capsule  12  . torsemide (DEMADEX) 20 MG tablet Take 1 tablet (20 mg total) by mouth 2 (two) times daily.  30 tablet  12  . vitamin B-12 (CYANOCOBALAMIN) 1000 MCG tablet Take 1,000 mcg by mouth daily.      . metoCLOPramide (REGLAN) 10 MG tablet Take 1 tablet (10 mg total) by mouth 4 (four) times daily -  before meals and at bedtime.  90 tablet  12   Scheduled: .  albuterol  2.5 mg Nebulization Q4H  . aspirin  81 mg Oral Daily  . atorvastatin  80 mg Oral q1800  . calcitRIOL  0.25 mcg Oral Daily  . digoxin  0.125 mg Oral Daily  . diltiazem  120 mg Oral Daily  . fesoterodine  8 mg Oral Daily  . fluconazole (DIFLUCAN) IV  100 mg Intravenous Q24H  . fluticasone  2 spray Each Nare Daily  . furosemide  80 mg Intravenous BID  . gabapentin  100 mg Oral TID  . heparin  5,000 Units Subcutaneous Q8H  . hydrocortisone sod succinate (SOLU-CORTEF) inj  50 mg Intravenous BID  . insulin aspart  0-20 Units Subcutaneous TID WC  . insulin aspart  0-5 Units Subcutaneous QHS  . insulin aspart  3 Units Subcutaneous TID WC  . insulin detemir  55 Units Subcutaneous QHS  . ipratropium  500 mcg Nebulization QID  . levothyroxine  200 mcg Oral QAC breakfast  . linezolid  600 mg Intravenous Q12H  . metoCLOPramide  10 mg Oral TID AC & HS  . metroNIDAZOLE  500 mg Oral Q8H  . mometasone-formoterol  2 puff Inhalation BID  . pantoprazole  40 mg Oral BID AC  . polyethylene glycol  17 g Oral Daily  . sertraline  50 mg Oral Daily  . sodium chloride  3 mL Intravenous Q12H  . sucralfate  1 g Oral TID WC & HS  . vitamin B-12  1,000 mcg Oral Daily   Continuous: . 0.9 % NaCl with KCl 20 mEq / L 10 mL/hr at 10/12/12 0600   ZOX:[id number], ALPRAZolam, alum & mag hydroxide-simeth, HYDROcodone-acetaminophen, loperamide, nitroGLYCERIN, ondansetron (ZOFRAN) IV, ondansetron, ondansetron  Assesment:  She has cellulitis of her right arm which is the reason for admission. She has multiple other complications and medical problems including vancomycin resistant enterococcus in her urine morbid obesity acute on chronic renal failure COPD chronic diastolic heart failure, adrenal insufficiency and diabetes. She has positive for C. difficile now. She has had episodes of nonsustained ventricular tachycardia. She has poor venous access and multiple medical problems Principal Problem:   Cellulitis of  arm, right Active Problems:   HYPOTHYROIDISM   Edema   Diabetes mellitus type 2 with complications   Chronic diastolic heart failure   Adrenal insufficiency   ARF (acute renal failure)   Morbid obesity   Chronic kidney disease, stage III (moderate)   Atrial fibrillation   UTI (urinary tract infection)   Acute renal failure (ARF)   VRE (vancomycin resistant enterococcus) culture positive   Chest pain   Nonsustained ventricular tachycardia    Plan: She will start by mouth Flagyl. She will continue her other treatments. Cardiology consultation is underway. She needs to be set up for Port-A-Cath after she is cleared.    LOS: 6 days   Marri Mcneff L 10/12/2012, 8:15 AM

## 2012-10-12 NOTE — Progress Notes (Signed)
Subjective: Interval History: has no complaint of nausea or vomiting. Patient also denies any difficulty now but she said that she had last night for a short period presently she doesn't offer any complaints..  Objective: Vital signs in last 24 hours: Temp:  [97.9 F (36.6 C)-98.8 F (37.1 C)] 98.6 F (37 C) (05/19 0400) Pulse Rate:  [73-96] 81 (05/19 0500) Resp:  [11-19] 12 (05/19 0600) BP: (110-151)/(60-84) 139/79 mmHg (05/19 0600) SpO2:  [91 %-99 %] 94 % (05/19 0500) FiO2 (%):  [98 %] 98 % (05/18 0824) Weight:  [136.9 kg (301 lb 13 oz)] 136.9 kg (301 lb 13 oz) (05/19 0500) Weight change: 0.6 kg (1 lb 5.2 oz)  Intake/Output from previous day: 05/18 0701 - 05/19 0700 In: 1180 [I.V.:230; IV Piggyback:950] Out: 1050 [Urine:1050] Intake/Output this shift:    General appearance: alert, cooperative and no distress Resp: diminished breath sounds anterior - bilateral Cardio: irregularly irregular rhythm GI: soft, non-tender; bowel sounds normal; no masses,  no organomegaly Extremities: edema 2-3+ edema  Lab Results:  Recent Labs  10/11/12 0118  WBC 6.5  HGB 9.5*  HCT 30.1*  PLT 226   BMET:  Recent Labs  10/10/12 0536 10/11/12 0118  NA 140 140  K 4.5 4.1  CL 108 107  CO2 24 21  GLUCOSE 135* 183*  BUN 36* 34*  CREATININE 2.95* 2.28*  CALCIUM 7.6* 8.3*   No results found for this basename: PTH,  in the last 72 hours Iron Studies: No results found for this basename: IRON, TIBC, TRANSFERRIN, FERRITIN,  in the last 72 hours  Studies/Results: No results found.  I have reviewed the patient's current medications.  Assessment/Plan: Problem #1 renal failure chronic her BUN is 34 creatinine is 2.28 renal function seems to be stable. Problem #2 anasarca patient is still with significant fluid overload. Presently on diuretics her urine output is reasonable. Problem #3 hypertension her blood pressure seems to be reasonably controlled Problem #4 after fibrillation Problem  #5 UTI /VRE presently patient is on antibiotics Problem #6 anemia her hemoglobin is 9.5 hematocrit 30.1 Problem #7 history of diabetes Problem #8 history of her renal insufficiency. Problem #9 hypokalemia potassium is corrected. Plan: Increase Lasix to 80 mg IV twice a day We'll check her basic metabolic panel in the morning.    LOS: 6 days   Keena Heesch S 10/12/2012,7:54 AM
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
## 2012-10-12 NOTE — Progress Notes (Signed)
Subjective: Patient comfortable. No complaints of pain or discomfort.  Objective: Vital signs in last 24 hours: Temp:  [97.9 F (36.6 [name])-98.8 F (37.1 [name])] 97.9 F (36.6 [name]) (05/19 0730) Pulse Rate:  [73-93] 87 (05/19 0900) Resp:  [11-18] 13 (05/19 0900) BP: (110-150)/(60-84) 145/84 mmHg (05/19 0800) SpO2:  [91 %-99 %] 99 % (05/19 0900) Weight:  [136.9 kg (301 lb 13 oz)] 136.9 kg (301 lb 13 oz) (05/19 0500) Last BM Date: 10/11/12  Intake/Output from previous day: 05/18 0701 - 05/19 0700 In: 1190 [I.V.:240; IV Piggyback:950] Out: 1050 [Urine:1050] Intake/Output this shift: Total I/O In: 500 [P.O.:480; I.V.:20] Out: -   General appearance: alert and no distress Extremities: Warm and dry. Still some erythema of the right arm.  Lab Results:   Recent Labs  10/11/12 0118  WBC 6.5  HGB 9.5*  HCT 30.1*  PLT 226   BMET  Recent Labs  10/10/12 0536 10/11/12 0118  NA 140 140  K 4.5 4.1  CL 108 107  CO2 24 21  GLUCOSE 135* 183*  BUN 36* 34*  CREATININE 2.95* 2.28*  CALCIUM 7.6* 8.3*   PT/INR No results found for this basename: LABPROT, INR,  in the last 72 hours ABG No results found for this basename: PHART, PCO2, PO2, HCO3,  in the last 72 hours  Studies/Results: No results found.  Anti-infectives: Anti-infectives   Start     Dose/Rate Route Frequency Ordered Stop   10/12/12 0830  metroNIDAZOLE (FLAGYL) tablet 500 mg     500 mg Oral 3 times per day 10/12/12 0815 10/26/12 0559   10/10/12 1100  fluconazole (DIFLUCAN) IVPB 100 mg     100 mg 50 mL/hr over 60 Minutes Intravenous Every 24 hours 10/10/12 0901     10/10/12 1000  linezolid (ZYVOX) IVPB 600 mg     600 mg 300 mL/hr over 60 Minutes Intravenous Every 12 hours 10/10/12 0855     10/09/12 1000  vancomycin (VANCOCIN) 1,500 mg in sodium chloride 0.9 % 500 mL IVPB  Status:  Discontinued     1,500 mg 250 mL/hr over 120 Minutes Intravenous Every 48 hours 10/09/12 0740 10/10/12 0912   10/07/12 1200   vancomycin (VANCOCIN) 1,500 mg in sodium chloride 0.9 % 500 mL IVPB     1,500 mg 250 mL/hr over 120 Minutes Intravenous  Once 10/07/12 1036 10/07/12 1411   10/07/12 1000  azithromycin (ZITHROMAX) tablet 250 mg  Status:  Discontinued     250 mg Oral Daily 10/06/12 1626 10/06/12 1757   10/06/12 1930  vancomycin (VANCOCIN) 1,500 mg in sodium chloride 0.9 % 500 mL IVPB  Status:  Discontinued     1,500 mg 250 mL/hr over 120 Minutes Intravenous  Once 10/06/12 1837 10/08/12 0859   10/06/12 1800  cefTRIAXone (ROCEPHIN) 1 g in dextrose 5 % 50 mL IVPB  Status:  Discontinued     1 g 100 mL/hr over 30 Minutes Intravenous Every 24 hours 10/06/12 1757 10/06/12 1803   10/06/12 1630  azithromycin (ZITHROMAX) tablet 500 mg     500 mg Oral Daily 10/06/12 1626 10/06/12 1742   10/06/12 1600  vancomycin (VANCOCIN) IVPB 1000 mg/200 mL premix     1,000 mg 200 mL/hr over 60 Minutes Intravenous  Once 10/06/12 1551 10/06/12 1830   10/06/12 1600  piperacillin-tazobactam (ZOSYN) IVPB 3.375 g     3.375 g 12.5 mL/hr over 240 Minutes Intravenous  Once 10/06/12 1551 10/06/12 1835      Assessment/Plan: s/p * No surgery  found * Chronic right arm cellulitis. Multiple medical problems. At this time cardiology does wish to proceed with Lasix prior to surgical intervention. They stated possible readiness in the next 24 hours. Additionally patient is currently on Zyvox for IV coverage. As oral Zyvox is a possible option definitive Port-A-Cath placement may not be required. I will continue to follow her progress and should patient be switched to oral Zyvox and tolerate this then Port-A-Cath placement may be deferred. Patient is aware of this.  LOS: 6 days    Ann [name] 10/12/2012
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
## 2012-10-12 NOTE — Progress Notes (Signed)
PT Cancellation Note  Patient Details Name: Ann [name] MRN: [id number] DOB: 1945/07/29   Cancelled Treatment:    Reason Eval/Treat Not Completed: Medical issues which prohibited therapy;Other (comment) Pt now in the ICU due to cardiac issues.  Because of this change in status, we will place PT on hold until new orders are received.  Currently, pt states that she does not feel well at all  And would not be able to work with me under any conditions.  Myrlene Broker L 10/12/2012, 1:34 PM

## 2012-10-13 DIAGNOSIS — I2510 Atherosclerotic heart disease of native coronary artery without angina pectoris: Secondary | ICD-10-CM | POA: Diagnosis not present

## 2012-10-13 DIAGNOSIS — D649 Anemia, unspecified: Secondary | ICD-10-CM | POA: Diagnosis not present

## 2012-10-13 DIAGNOSIS — N390 Urinary tract infection, site not specified: Secondary | ICD-10-CM | POA: Diagnosis not present

## 2012-10-13 DIAGNOSIS — I5033 Acute on chronic diastolic (congestive) heart failure: Secondary | ICD-10-CM | POA: Diagnosis not present

## 2012-10-13 DIAGNOSIS — IMO0002 Reserved for concepts with insufficient information to code with codable children: Secondary | ICD-10-CM | POA: Diagnosis not present

## 2012-10-13 DIAGNOSIS — E2749 Other adrenocortical insufficiency: Secondary | ICD-10-CM | POA: Diagnosis not present

## 2012-10-13 DIAGNOSIS — N179 Acute kidney failure, unspecified: Secondary | ICD-10-CM | POA: Diagnosis not present

## 2012-10-13 LAB — BASIC METABOLIC PANEL
BUN: 28 mg/dL — ABNORMAL HIGH (ref 6–23)
Creatinine, Ser: 1.54 mg/dL — ABNORMAL HIGH (ref 0.50–1.10)
GFR calc Af Amer: 39 mL/min — ABNORMAL LOW (ref >90)
GFR calc non Af Amer: 34 mL/min — ABNORMAL LOW (ref >90)

## 2012-10-13 MED ORDER — POTASSIUM CHLORIDE CRYS ER 20 MEQ PO TBCR
40.0000 meq | EXTENDED_RELEASE_TABLET | Freq: Two times a day (BID) | ORAL | Status: DC
  Administered 2012-10-13 (×2): 40 meq via ORAL
  Filled 2012-10-13 (×2): qty 2

## 2012-10-13 MED ORDER — FUROSEMIDE 10 MG/ML IJ SOLN
80.0000 mg | Freq: Two times a day (BID) | INTRAMUSCULAR | Status: DC
  Administered 2012-10-13 (×2): 80 mg via INTRAVENOUS
  Filled 2012-10-13 (×2): qty 8

## 2012-10-13 MED ORDER — POTASSIUM CHLORIDE CRYS ER 20 MEQ PO TBCR: 40.0000 meq | EXTENDED_RELEASE_TABLET | ORAL | Status: DC

## 2012-10-13 MED ORDER — FUROSEMIDE 10 MG/ML IJ SOLN
80.0000 mg | Freq: Three times a day (TID) | INTRAMUSCULAR | Status: DC
  Administered 2012-10-13: 80 mg via INTRAVENOUS
  Filled 2012-10-13 (×2): qty 8

## 2012-10-13 MED ORDER — SODIUM CHLORIDE 0.45 % IV SOLN
INTRAVENOUS | Status: DC
  Administered 2012-10-13 – 2012-10-15 (×3): via INTRAVENOUS
  Filled 2012-10-13 (×4): qty 1000

## 2012-10-13 MED ORDER — POTASSIUM CHLORIDE 10 MEQ/100ML IV SOLN
10.0000 meq | INTRAVENOUS | Status: AC
  Administered 2012-10-13 (×3): 10 meq via INTRAVENOUS
  Filled 2012-10-13: qty 300

## 2012-10-13 NOTE — Progress Notes (Signed)
Physical Therapy Treatment Patient Details Name: Ann [name] MRN: [id number] DOB: Oct 02, 1945 Today's Date: 10/13/2012 Time: 4098-1191 PT Time Calculation (min): 34 min  PT Assessment / Plan / Recommendation Comments on Treatment Session  Pt is now in ICU.  She is stable per MD chart report and pt has no c/o,  I have not spoken with Dr. Juanetta Gosling about resumption of PT, however his note this AM indicated to continue all tx.  In discussion with RN this AM, I decided to work with pt on ther ex in the bed while monitoring vital signs.  She was able to tolerate all exercise with no stress.      Follow Up Recommendations        Does the patient have the potential to tolerate intense rehabilitation     Barriers to Discharge        Equipment Recommendations       Recommendations for Other Services    Frequency     Plan Discharge plan remains appropriate;Frequency remains appropriate (try to talk with MD about PT plan)    Precautions / Restrictions     Pertinent Vitals/Pain     Mobility  Bed Mobility Details for Bed Mobility Assistance: pt is in ICU, so we are not trying to mobilize OOB until we have conferred with MD.    Exercises General Exercises - Lower Extremity Ankle Circles/Pumps: AROM;Both;10 reps;Supine Quad Sets: AROM;Both;10 reps;Supine Gluteal Sets: AROM;Both;10 reps;Supine Short Arc Quad: AROM;Both;10 reps;Supine Heel Slides: AROM;Both;10 reps;Supine Hip ABduction/ADduction: AROM;Both;10 reps;Supine   PT Diagnosis:    PT Problem List:   PT Treatment Interventions:     PT Goals Acute Rehab PT Goals PT Goal: Supine/Side to Sit - Progress: Not progressing PT Goal: Sit to Stand - Progress: Not progressing PT Transfer Goal: Bed to Chair/Chair to Bed - Progress: Not progressing PT Goal: Ambulate - Progress: Not progressing  Visit Information  Last PT Received On: 10/13/12    Subjective Data  Subjective: no c/o Patient Stated Goal: return home...continues to  refuse SNF   Cognition       Balance     End of Session PT - End of Session Equipment Utilized During Treatment: Oxygen Activity Tolerance: Patient tolerated treatment well Patient left: in bed;with call bell/phone within reach;with nursing in room   GP     Konrad Penta 10/13/2012, 11:34 AM

## 2012-10-13 NOTE — Progress Notes (Signed)
Subjective: She says she feels somewhat better. She is down significantly as far as her edema and she diuresed well with Lasix drip. She still has significant edema. Her potassium is low this morning. She has no other complaints.  Objective: Vital signs in last 24 hours: Temp:  [97.7 F (36.5 C)-98.1 F (36.7 C)] 97.8 F (36.6 C) (05/20 0800) Pulse Rate:  [61-92] 73 (05/20 0600) Resp:  [12-22] 14 (05/20 0200) BP: (141-169)/(59-101) 169/101 mmHg (05/20 0600) SpO2:  [92 %-99 %] 99 % (05/20 0749) Weight:  [131.8 kg (290 lb 9.1 oz)] 131.8 kg (290 lb 9.1 oz) (05/20 0447) Weight change: -5.1 kg (-11 lb 3.9 oz) Last BM Date: 10/11/12  Intake/Output from previous day: 05/19 0701 - 05/20 0700 In: 2093 [P.O.:1680; I.V.:413] Out: 4600 [Urine:4600]  PHYSICAL EXAM General appearance: alert, cooperative, mild distress and morbidly obese Resp: rhonchi bilaterally Cardio: Somewhat irregular rhythm but no gallop GI: soft, non-tender; bowel sounds normal; no masses,  no organomegaly Extremities: extremities normal, atraumatic, no cyanosis or edema  Lab Results:    Basic Metabolic Panel:  Recent Labs  16/10/96 0118 10/13/12 0451  NA 140 144  K 4.1 2.7*  CL 107 104  CO2 21 30  GLUCOSE 183* 90  BUN 34* 28*  CREATININE 2.28* 1.54*  CALCIUM 8.3* 8.2*   Liver Function Tests: No results found for this basename: AST, ALT, ALKPHOS, BILITOT, PROT, ALBUMIN,  in the last 72 hours No results found for this basename: LIPASE, AMYLASE,  in the last 72 hours No results found for this basename: AMMONIA,  in the last 72 hours CBC:  Recent Labs  10/11/12 0118  WBC 6.5  HGB 9.5*  HCT 30.1*  MCV 101.3*  PLT 226   Cardiac Enzymes:  Recent Labs  10/10/12 1340 10/10/12 1943 10/11/12 0118  TROPONINI <0.30 <0.30 <0.30   BNP:  Recent Labs  10/12/12 1016  PROBNP 14169.0*   D-Dimer: No results found for this basename: DDIMER,  in the last 72 hours CBG:  Recent Labs  10/11/12 2135  10/12/12 0754 10/12/12 1209 10/12/12 1618 10/12/12 2158 10/13/12 0752  GLUCAP 139* 90 161* 108* 122* 75   Hemoglobin A1C: No results found for this basename: HGBA1C,  in the last 72 hours Fasting Lipid Panel: No results found for this basename: CHOL, HDL, LDLCALC, TRIG, CHOLHDL, LDLDIRECT,  in the last 72 hours Thyroid Function Tests: No results found for this basename: TSH, T4TOTAL, FREET4, T3FREE, THYROIDAB,  in the last 72 hours Anemia Panel: No results found for this basename: VITAMINB12, FOLATE, FERRITIN, TIBC, IRON, RETICCTPCT,  in the last 72 hours Coagulation: No results found for this basename: LABPROT, INR,  in the last 72 hours Urine Drug Screen: Drugs of Abuse  No results found for this basename: labopia, cocainscrnur, labbenz, amphetmu, thcu, labbarb    Alcohol Level: No results found for this basename: ETH,  in the last 72 hours Urinalysis: No results found for this basename: COLORURINE, APPERANCEUR, LABSPEC, PHURINE, GLUCOSEU, HGBUR, BILIRUBINUR, KETONESUR, PROTEINUR, UROBILINOGEN, NITRITE, LEUKOCYTESUR,  in the last 72 hours Misc. Labs:  ABGS No results found for this basename: PHART, PCO2, PO2ART, TCO2, HCO3,  in the last 72 hours CULTURES Recent Results (from the past 240 hour(s))  CULTURE, BLOOD (ROUTINE X 2)     Status: None   Collection Time    10/06/12  4:50 PM      Result Value Range Status   Specimen Description BLOOD RIGHT ANTECUBITAL   Final   Special Requests  BOTTLES DRAWN AEROBIC AND ANAEROBIC 8CC   Final   Culture NO GROWTH 5 DAYS   Final   Report Status 10/11/2012 FINAL   Final  CULTURE, BLOOD (ROUTINE X 2)     Status: None   Collection Time    10/06/12  4:55 PM      Result Value Range Status   Specimen Description BLOOD LEFT ANTECUBITAL   Final   Special Requests     Final   Value: BOTTLES DRAWN AEROBIC AND ANAEROBIC AEB=15CC ANA=5CC   Culture NO GROWTH 5 DAYS   Final   Report Status 10/11/2012 FINAL   Final  URINE CULTURE     Status:  None   Collection Time    10/06/12  6:41 PM      Result Value Range Status   Specimen Description URINE, CATHETERIZED   Final   Special Requests NONE   Final   Culture  Setup Time 10/06/2012 19:00   Final   Colony Count >=100,000 COLONIES/ML   Final   Culture     Final   Value: VANCOMYCIN RESISTANT ENTEROCOCCUS ISOLATED     Note: CRITICAL RESULT CALLED TO, READ BACK BY AND VERIFIED WITH: MELANE SIMPSON @ 2031 ON 10/09/2012   Report Status 10/09/2012 FINAL   Final   Organism ID, Bacteria VANCOMYCIN RESISTANT ENTEROCOCCUS ISOLATED   Final  CLOSTRIDIUM DIFFICILE BY PCR     Status: Abnormal   Collection Time    10/11/12  2:30 PM      Result Value Range Status   C difficile by pcr POSITIVE (*) NEGATIVE Final   Comment: CRITICAL RESULT CALLED TO, READ BACK BY AND VERIFIED WITH:     TOLER M. AT 1617 ON [id number] BY THOMPSON S.   Studies/Results: No results found.  Medications:  Prior to Admission:  Prescriptions prior to admission  Medication Sig Dispense Refill  . albuterol (PROVENTIL HFA;VENTOLIN HFA) 108 (90 BASE) MCG/ACT inhaler Inhale 2 puffs into the lungs every 6 (six) hours as needed for wheezing.  1 Inhaler  2  . ALPRAZolam (XANAX) 0.5 MG tablet Take 0.5 mg by mouth 3 (three) times daily as needed for sleep.       Marland Kitchen aspirin 81 MG chewable tablet Chew 81 mg by mouth daily.      . calcitRIOL (ROCALTROL) 0.25 MCG capsule Take 0.25 mcg by mouth daily.      . digoxin (LANOXIN) 0.125 MG tablet Take 1 tablet (0.125 mg total) by mouth daily.  30 tablet  12  . diltiazem (CARDIZEM) 120 MG tablet Take 1 tablet (120 mg total) by mouth daily.  30 tablet  12  . fluticasone (FLONASE) 50 MCG/ACT nasal spray Place 2 sprays into the nose daily.      . Fluticasone-Salmeterol (ADVAIR DISKUS) 250-50 MCG/DOSE AEPB Inhale 1 puff into the lungs 2 (two) times daily.  60 each  12  . gabapentin (NEURONTIN) 100 MG capsule Take 100 mg by mouth 3 (three) times daily.      Marland Kitchen HYDROcodone-acetaminophen (NORCO)  5-325 MG per tablet Take 1 tablet by mouth every 6 (six) hours as needed for pain.  30 tablet  0  . insulin aspart (NOVOLOG) 100 UNIT/ML injection Inject 3-15 Units into the skin 3 (three) times daily before meals. 121-150=3 units, 151-200=4 units, 201-250=7 units, 251-300=9 units, 301-350=12 units, 351-400=15 units.      . insulin detemir (LEVEMIR) 100 UNIT/ML injection Inject 50 Units into the skin at bedtime.       Marland Kitchen  ipratropium (ATROVENT) 0.02 % nebulizer solution Take 2.5 mLs (500 mcg total) by nebulization 4 (four) times daily.  75 mL  12  . levalbuterol (XOPENEX) 0.63 MG/3ML nebulizer solution Take 3 mLs (0.63 mg total) by nebulization every 4 (four) hours as needed for wheezing.  3 mL  12  . levothyroxine (SYNTHROID, LEVOTHROID) 200 MCG tablet Take 200 mcg by mouth daily before breakfast.      . ondansetron (ZOFRAN) 4 MG tablet Take 1 tablet (4 mg total) by mouth every 6 (six) hours as needed for nausea.  20 tablet  5  . pantoprazole (PROTONIX) 40 MG tablet Take 1 tablet (40 mg total) by mouth 2 (two) times daily before a meal.  60 tablet  12  . polyethylene glycol (MIRALAX / GLYCOLAX) packet Take 17 g by mouth daily.      . predniSONE (DELTASONE) 5 MG tablet Take 2 tablets (10 mg total) by mouth daily.  60 tablet  12  . rosuvastatin (CRESTOR) 40 MG tablet Take 1 tablet (40 mg total) by mouth daily.  30 tablet  12  . sertraline (ZOLOFT) 50 MG tablet Take 1 tablet (50 mg total) by mouth daily.  30 tablet  12  . tolterodine (DETROL LA) 4 MG 24 hr capsule Take 1 capsule (4 mg total) by mouth daily.  30 capsule  12  . torsemide (DEMADEX) 20 MG tablet Take 1 tablet (20 mg total) by mouth 2 (two) times daily.  30 tablet  12  . vitamin B-12 (CYANOCOBALAMIN) 1000 MCG tablet Take 1,000 mcg by mouth daily.      . metoCLOPramide (REGLAN) 10 MG tablet Take 1 tablet (10 mg total) by mouth 4 (four) times daily -  before meals and at bedtime.  90 tablet  12   Scheduled: . albuterol  2.5 mg Nebulization  Q4H  . aspirin  81 mg Oral Daily  . atorvastatin  80 mg Oral q1800  . calcitRIOL  0.25 mcg Oral Daily  . digoxin  0.125 mg Oral Daily  . diltiazem  120 mg Oral Daily  . fesoterodine  8 mg Oral Daily  . fluconazole  100 mg Oral Daily  . fluticasone  2 spray Each Nare Daily  . furosemide  80 mg Intravenous BID  . gabapentin  100 mg Oral TID  . heparin  5,000 Units Subcutaneous Q8H  . hydrocortisone sod succinate (SOLU-CORTEF) inj  50 mg Intravenous BID  . insulin aspart  0-20 Units Subcutaneous TID WC  . insulin aspart  0-5 Units Subcutaneous QHS  . insulin aspart  3 Units Subcutaneous TID WC  . insulin detemir  55 Units Subcutaneous QHS  . ipratropium  500 mcg Nebulization QID  . levothyroxine  200 mcg Oral QAC breakfast  . linezolid  600 mg Oral Q12H  . metoCLOPramide  10 mg Oral TID AC & HS  . metroNIDAZOLE  500 mg Oral Q8H  . mometasone-formoterol  2 puff Inhalation BID  . pantoprazole  40 mg Oral BID AC  . polyethylene glycol  17 g Oral Daily  . potassium chloride  10 mEq Intravenous Q1 Hr x 3  . potassium chloride  40 mEq Oral BID  . sodium chloride  3 mL Intravenous Q12H  . sucralfate  1 g Oral TID WC & HS  . vitamin B-12  1,000 mcg Oral Daily   Continuous: . sodium chloride 0.45 % with kcl     WUJ:WJXBJYNWG, ALPRAZolam, alum & mag hydroxide-simeth, HYDROcodone-acetaminophen, loperamide, nitroGLYCERIN, ondansetron (ZOFRAN)  IV, ondansetron, ondansetron  Assesment: She was admitted with cellulitis of the arm. She has multiple other medical problems including coronary artery occlusive disease with an episode of chest pain it did not seem to be cardiac in nature. She has atrial fibrillation. She has acute on chronic diastolic heart failure she has had acute on chronic renal failure. She has VRE in the urine Principal Problem:   Cellulitis of arm, right Active Problems:   HYPOTHYROIDISM   Edema   Diabetes mellitus type 2 with complications   Chronic diastolic heart  failure   Adrenal insufficiency   ARF (acute renal failure)   Morbid obesity   Chronic kidney disease, stage III (moderate)   Atrial fibrillation   UTI (urinary tract infection)   Acute renal failure (ARF)   VRE (vancomycin resistant enterococcus) culture positive   Chest pain   Nonsustained ventricular tachycardia    Plan: Continue current treatments.    LOS: 7 days   Ann [name] L 10/13/2012, 8:20 AM

## 2012-10-13 NOTE — Progress Notes (Signed)
SUBJECTIVE:Feeling some better, but remains edematous. No further ventricular arrhythmias  LABS: Basic Metabolic Panel:  Recent Labs  95/62/13 0118 10/13/12 0451  NA 140 144  K 4.1 2.7*  CL 107 104  CO2 21 30  GLUCOSE 183* 90  BUN 34* 28*  CREATININE 2.28* 1.54*  CALCIUM 8.3* 8.2*   CBC:  Recent Labs  10/11/12 0118  WBC 6.5  HGB 9.5*  HCT 30.1*  MCV 101.3*  PLT 226   Cardiac Enzymes:  Recent Labs  10/10/12 1340 10/10/12 1943 10/11/12 0118  TROPONINI <0.30 <0.30 <0.30   PHYSICAL EXAM BP 169/101  Pulse 73  Temp(Src) 97.8 F (36.6 C) (Oral)  Resp 14  Ht 5\' 4"  (1.626 m)  Wt 290 lb 9.1 oz (131.8 kg)  BMI 49.85 kg/m2  SpO2 99% Weight loss of 11 lbs since highest recorded, but total I&O only -1.3 L.  Diuresis has slowed today, and I&O is essentially balanced.  General: Well developed, well nourished, in no acute distress; obese Head: Eyes PERRLA, No xanthomas.   Normal cephalic and atramatic  Lungs: Some inspiratory wheezes, diminished in the bases.  Heart: HRRR, with occasional irregularity,S1 S2, No RG; grade 2/6 basilar systolic ejection murmur.  Pulses are 2+ & equal. No carotid bruit. Unable to evaluate JVD secondary to obesity.  No abdominal bruits. No femoral bruits. Abdomen: Bowel sounds are positive, abdomen soft and non-tender without masses Msk:  Back normal, normal gait. Normal strength and tone for age. Extremities: No clubbing, cyanosis 2+ pretibial, and 3+ ankle/foot edema; 1+ edema in the thigh.  DP diminished.  Neuro: Alert and oriented X 3. Psych:  Good affect, responds appropriately  TELEMETRY: Reviewed telemetry pt in NSR with PAC's  ASSESSMENT AND PLAN: 1. Acute on Chronic CHF: Most likely right heart failure in the setting of hypoventialtion due to obesity.  ABG 2 months ago demonstrated significant chronic respiratory acidosis with a PCO2 of 57. She has diuresed 2 L since IV lasix gtt initiated with improvement in renal function. Infusion  has been discontinued and intermittent dosing resumed.  2. NSVT: No further episodes overnight. Potassium decreased to 2.7 on this am labs. IV potassium administered this morning. We will supplement with oral replacement.  3. Atrial fibrillation: Occurred during a previous hospitalization with substantial physiologic stress. Patient is currently in NSR with PACs. She continues on heparin SQ, xarelto is on hold. Heart rate is well controlled. Digoxin level 0.9  4. CAD: She has a history of PTCA./ BMS to the prox LAD, with cath in 2009 demonstrating no restenosis and only nonobstructive residual coronary disease. Troponin is negative this admission.  5. Chronic Renal Insufficiency: Improved with creatinine decreasing from  2.28 to 1.54. Followed by Dr. Fausto Skillern  6. Anemia: Probably secondary to chronic kidney disease.   7. Hypertension: Control is somewhat suboptimal; medication will be adjusted.  Bettey Mare. Lyman Bishop NP [facility] 10/13/2012, 8:23 AM  Cardiology Attending Patient interviewed and examined. Discussed with Joni Reining, NP.  Above note annotated and modified based upon my findings.  Patient has substantial lower extremity edema, more impressive below the knees. Weight is lower than it has been for at least the past year. As long as she improves with diuresis, and renal function permits,  fluid removal can be continued.  [place] Bing, MD 10/13/2012, 5:45 PM

## 2012-10-13 NOTE — Progress Notes (Signed)
Nutrition Brief Note  Patient identified due to length of stay. Chart reviewed.  Body mass index is 49.85 kg/(m^2). Patient meets criteria for Extreme Obesity Class III based on current BMI.   Wt Readings from Last 10 Encounters:  10/13/12 290 lb 9.1 oz (131.8 kg)  09/22/12 283 lb 14.4 oz (128.776 kg)  09/22/12 283 lb 14.4 oz (128.776 kg)  09/09/12 275 lb (124.739 kg)  08/12/12 302 lb 11.2 oz (137.304 kg)  07/21/12 302 lb (136.986 kg)  07/06/12 311 lb 1.1 oz (141.1 kg)  06/14/12 308 lb 6.8 oz (139.9 kg)  04/01/12 322 lb 1.5 oz (146.1 kg)  01/07/12 303 lb 6.4 oz (137.621 kg)   Current diet order is CHO Modified , patient is consuming approximately 75% of meals at this time.   No nutrition interventions warranted at this time. If nutrition issues arise, please consult RD.   Royann Shivers MS,RD,LDN,CSG Office: [phone] Pager: [phone]

## 2012-10-13 NOTE — Progress Notes (Signed)
UR Chart Review Completed  

## 2012-10-13 NOTE — Progress Notes (Signed)
Subjective: Interval History: has no complaint of difficulty in breathing. Patient denies any nausea or vomiting. Presently Schofer is no complaints..  Objective: Vital signs in last 24 hours: Temp:  [97.7 F (36.5 C)-98.1 F (36.7 C)] 97.7 F (36.5 C) (05/20 0447) Pulse Rate:  [61-92] 73 (05/20 0600) Resp:  [12-22] 14 (05/20 0200) BP: (141-169)/(59-101) 169/101 mmHg (05/20 0600) SpO2:  [92 %-99 %] 97 % (05/20 0600) Weight:  [131.8 kg (290 lb 9.1 oz)] 131.8 kg (290 lb 9.1 oz) (05/20 0447) Weight change: -5.1 kg (-11 lb 3.9 oz)  Intake/Output from previous day: 05/19 0701 - 05/20 0700 In: 2093 [P.O.:1680; I.V.:413] Out: 4600 [Urine:4600] Intake/Output this shift:    General appearance: alert, cooperative and no distress Resp: diminished breath sounds anterior - bilateral Cardio: regular rate and rhythm GI: soft, non-tender; bowel sounds normal; no masses,  no organomegaly Extremities: edema 2+ edema  Lab Results:  Recent Labs  10/11/12 0118  WBC 6.5  HGB 9.5*  HCT 30.1*  PLT 226   BMET:  Recent Labs  10/11/12 0118 10/13/12 0451  NA 140 144  K 4.1 2.7*  CL 107 104  CO2 21 30  GLUCOSE 183* 90  BUN 34* 28*  CREATININE 2.28* 1.54*  CALCIUM 8.3* 8.2*   No results found for this basename: PTH,  in the last 72 hours Iron Studies: No results found for this basename: IRON, TIBC, TRANSFERRIN, FERRITIN,  in the last 72 hours  Studies/Results: No results found.  I have reviewed the patient's current medications.  Assessment/Plan: Problem #1 renal failure acute on chronic her BUN is 28 and creatinine is 1.54 renal function is progressively improving. Problem #2 anasarca patient on diuretics she is a good urine output about 4-1/2 L he he he last 24 hours.  still patient has some edema. Problem #2 hypokalemia this is secondary to diuretics patient on oral potassium however potassium has declined. Problem #3 hypertension her blood pressure seems to be reasonably  controlled Problem #4 diabetes Problem #5 history of hypothyroidism Problem #6 UTI/VRE He problem #7 history of a trial fibrillation her heart rate is controlled. Plan: we'll give her her KCl 10 mEq IV over one hour x3 Change Lasix to 80 mg IV twice a day We'll check her basic metabolic panel in the morning.     LOS: 7 days   Spenser Cong S 10/13/2012,7:29 AM

## 2012-10-13 NOTE — Progress Notes (Signed)
CRITICAL VALUE ALERT  Critical value received:  Potassium 2.7  Date of notification:  10/13/2012  Time of notification:  06:20  Critical value read back:yes  Nurse who received alert:  Rodena Medin  MD notified (1st page):  Ouida Sills  Time of first page:  06:27  MD notified (2nd page):  Time of second page:  Responding MD:    Time MD responded:

## 2012-10-14 ENCOUNTER — Encounter (HOSPITAL_COMMUNITY): Payer: Self-pay | Admitting: Anesthesiology

## 2012-10-14 ENCOUNTER — Inpatient Hospital Stay (HOSPITAL_COMMUNITY): Payer: Medicare Other

## 2012-10-14 ENCOUNTER — Encounter (HOSPITAL_COMMUNITY)
Admission: EM | Disposition: A | Discharge status: Nursing Home (Includes ICF) | Payer: Self-pay | Source: Home / Self Care | Attending: Pulmonary Disease

## 2012-10-14 ENCOUNTER — Encounter (HOSPITAL_COMMUNITY): Payer: Self-pay | Admitting: *Deleted

## 2012-10-14 ENCOUNTER — Inpatient Hospital Stay (HOSPITAL_COMMUNITY): Payer: Medicare Other | Admitting: Anesthesiology

## 2012-10-14 DIAGNOSIS — J189 Pneumonia, unspecified organism: Secondary | ICD-10-CM | POA: Diagnosis not present

## 2012-10-14 DIAGNOSIS — J9819 Other pulmonary collapse: Secondary | ICD-10-CM | POA: Diagnosis not present

## 2012-10-14 DIAGNOSIS — I509 Heart failure, unspecified: Secondary | ICD-10-CM | POA: Diagnosis not present

## 2012-10-14 DIAGNOSIS — N179 Acute kidney failure, unspecified: Secondary | ICD-10-CM | POA: Diagnosis not present

## 2012-10-14 DIAGNOSIS — E876 Hypokalemia: Secondary | ICD-10-CM | POA: Diagnosis not present

## 2012-10-14 DIAGNOSIS — E2749 Other adrenocortical insufficiency: Secondary | ICD-10-CM | POA: Diagnosis not present

## 2012-10-14 DIAGNOSIS — I809 Phlebitis and thrombophlebitis of unspecified site: Secondary | ICD-10-CM | POA: Diagnosis not present

## 2012-10-14 HISTORY — PX: PORTACATH PLACEMENT: SHX2246

## 2012-10-14 LAB — GLUCOSE, CAPILLARY
Glucose-Capillary: 73 mg/dL (ref 70–99)
Glucose-Capillary: 119 mg/dL — ABNORMAL HIGH (ref 70–99)
Glucose-Capillary: 143 mg/dL — ABNORMAL HIGH (ref 70–99)

## 2012-10-14 LAB — BASIC METABOLIC PANEL
BUN: 27 mg/dL — ABNORMAL HIGH (ref 6–23)
CO2: 31 mEq/L (ref 19–32)
Chloride: 100 mEq/L (ref 96–112)
Creatinine, Ser: 1.34 mg/dL — ABNORMAL HIGH (ref 0.50–1.10)
Glucose, Bld: 61 mg/dL — ABNORMAL LOW (ref 70–99)

## 2012-10-14 LAB — CBC
HCT: 29.6 % — ABNORMAL LOW (ref 36.0–46.0)
MCH: 32.3 pg (ref 26.0–34.0)
MCHC: 32.4 g/dL (ref 30.0–36.0)
MCV: 99.7 fL (ref 78.0–100.0)
RDW: 15.3 % (ref 11.5–15.5)

## 2012-10-14 SURGERY — INSERTION, TUNNELED CENTRAL VENOUS DEVICE, WITH PORT
Anesthesia: Monitor Anesthesia Care | Site: Chest | Laterality: Left | Wound class: Clean

## 2012-10-14 MED ORDER — DEXTROSE 50 % IV SOLN
INTRAVENOUS | Status: AC
  Administered 2012-10-14: 12:00:00
  Filled 2012-10-14: qty 50

## 2012-10-14 MED ORDER — FENTANYL CITRATE 0.05 MG/ML IJ SOLN
INTRAMUSCULAR | Status: AC
  Filled 2012-10-14: qty 2

## 2012-10-14 MED ORDER — FENTANYL CITRATE 0.05 MG/ML IJ SOLN
25.0000 ug | INTRAMUSCULAR | Status: DC | PRN
  Administered 2012-10-14: 25 ug via INTRAVENOUS

## 2012-10-14 MED ORDER — MIDAZOLAM HCL 2 MG/2ML IJ SOLN
INTRAMUSCULAR | Status: AC
  Filled 2012-10-14: qty 2

## 2012-10-14 MED ORDER — PREDNISONE 20 MG PO TABS
20.0000 mg | ORAL_TABLET | Freq: Every day | ORAL | Status: DC
  Administered 2012-10-14 – 2012-10-15 (×2): 20 mg via ORAL
  Filled 2012-10-14 (×2): qty 1

## 2012-10-14 MED ORDER — LACTATED RINGERS IV SOLN
INTRAVENOUS | Status: DC | PRN
  Administered 2012-10-14: 10:00:00 via INTRAVENOUS

## 2012-10-14 MED ORDER — MUPIROCIN 2 % EX OINT
TOPICAL_OINTMENT | CUTANEOUS | Status: AC
  Filled 2012-10-14: qty 22

## 2012-10-14 MED ORDER — POTASSIUM CHLORIDE CRYS ER 20 MEQ PO TBCR
40.0000 meq | EXTENDED_RELEASE_TABLET | Freq: Three times a day (TID) | ORAL | Status: DC
  Administered 2012-10-14 – 2012-10-15 (×3): 40 meq via ORAL
  Filled 2012-10-14 (×3): qty 2

## 2012-10-14 MED ORDER — HEPARIN SODIUM (PORCINE) 1000 UNIT/ML IJ SOLN
INTRAMUSCULAR | Status: DC | PRN
  Administered 2012-10-14: 3000 [IU] via INTRAVENOUS

## 2012-10-14 MED ORDER — MIDAZOLAM HCL 2 MG/2ML IJ SOLN
1.0000 mg | INTRAMUSCULAR | Status: DC | PRN
  Administered 2012-10-14 (×2): 2 mg via INTRAVENOUS

## 2012-10-14 MED ORDER — LIDOCAINE HCL (PF) 1 % IJ SOLN
INTRAMUSCULAR | Status: AC
  Filled 2012-10-14: qty 30

## 2012-10-14 MED ORDER — PROPOFOL 10 MG/ML IV EMUL
INTRAVENOUS | Status: AC
  Filled 2012-10-14: qty 20

## 2012-10-14 MED ORDER — TOBRAMYCIN-DEXAMETHASONE 0.3-0.1 % OP SUSP
2.0000 [drp] | OPHTHALMIC | Status: DC
  Administered 2012-10-15: 2 [drp] via OPHTHALMIC
  Filled 2012-10-14: qty 2.5

## 2012-10-14 MED ORDER — LIDOCAINE HCL (PF) 1 % IJ SOLN
INTRAMUSCULAR | Status: DC | PRN
  Administered 2012-10-14 (×2): 10 mL

## 2012-10-14 MED ORDER — HEPARIN SODIUM (PORCINE) 1000 UNIT/ML IJ SOLN
INTRAMUSCULAR | Status: AC
  Filled 2012-10-14: qty 3

## 2012-10-14 MED ORDER — LIDOCAINE HCL (CARDIAC) 10 MG/ML IV SOLN
INTRAVENOUS | Status: DC | PRN
  Administered 2012-10-14: 50 mg via INTRAVENOUS

## 2012-10-14 MED ORDER — FENTANYL CITRATE 0.05 MG/ML IJ SOLN: 25.0000 ug | INTRAMUSCULAR | Status: DC | PRN

## 2012-10-14 MED ORDER — LACTATED RINGERS IV SOLN
INTRAVENOUS | Status: DC
  Administered 2012-10-14: 08:00:00 via INTRAVENOUS

## 2012-10-14 MED ORDER — FUROSEMIDE 10 MG/ML IJ SOLN
80.0000 mg | Freq: Two times a day (BID) | INTRAMUSCULAR | Status: DC
  Administered 2012-10-14 – 2012-10-15 (×3): 80 mg via INTRAVENOUS
  Filled 2012-10-14 (×2): qty 8

## 2012-10-14 MED ORDER — LIDOCAINE HCL (PF) 1 % IJ SOLN
INTRAMUSCULAR | Status: AC
  Filled 2012-10-14: qty 5

## 2012-10-14 MED ORDER — EPHEDRINE SULFATE 50 MG/ML IJ SOLN
INTRAMUSCULAR | Status: AC
  Filled 2012-10-14: qty 1

## 2012-10-14 MED ORDER — SODIUM CHLORIDE 0.9 % IV SOLN
INTRAVENOUS | Status: DC | PRN
  Administered 2012-10-14: 500 mL via INTRAMUSCULAR

## 2012-10-14 MED ORDER — MIDAZOLAM HCL 5 MG/5ML IJ SOLN
INTRAMUSCULAR | Status: DC | PRN
  Administered 2012-10-14 (×2): 1 mg via INTRAVENOUS

## 2012-10-14 MED ORDER — HYDROCORTISONE SOD SUCCINATE 100 MG PF FOR IT USE
50.0000 mg | Freq: Once | INTRAMUSCULAR | Status: AC
  Administered 2012-10-14: 50 mg via INTRATHECAL
  Filled 2012-10-14: qty 0.5

## 2012-10-14 MED ORDER — ONDANSETRON HCL 4 MG/2ML IJ SOLN: 4.0000 mg | Freq: Once | INTRAMUSCULAR | Status: DC | PRN

## 2012-10-14 MED ORDER — MUPIROCIN 2 % EX OINT
TOPICAL_OINTMENT | Freq: Two times a day (BID) | CUTANEOUS | Status: DC
  Administered 2012-10-14: 1 via NASAL

## 2012-10-14 MED ORDER — PROPOFOL INFUSION 10 MG/ML OPTIME
INTRAVENOUS | Status: DC | PRN
  Administered 2012-10-14: 50 ug/kg/min via INTRAVENOUS

## 2012-10-14 MED ORDER — CHLORHEXIDINE GLUCONATE 4 % EX LIQD
1.0000 "application " | Freq: Once | CUTANEOUS | Status: DC
  Filled 2012-10-14: qty 15

## 2012-10-14 SURGICAL SUPPLY — 46 items
APL SKNCLS STERI-STRIP NONHPOA (GAUZE/BANDAGES/DRESSINGS) ×1
APPLIER CLIP 9.375 SM OPEN (CLIP)
APR CLP SM 9.3 20 MLT OPN (CLIP)
BAG DECANTER FOR FLEXI CONT (MISCELLANEOUS) ×2 IMPLANT
BAG HAMPER (MISCELLANEOUS) ×2 IMPLANT
BENZOIN TINCTURE PRP APPL 2/3 (GAUZE/BANDAGES/DRESSINGS) ×2 IMPLANT
CATH HICKMAN DUAL 12.0 (CATHETERS) IMPLANT
CLIP APPLIE 9.375 SM OPEN (CLIP) IMPLANT
CLOTH BEACON ORANGE TIMEOUT ST (SAFETY) ×2 IMPLANT
COVER LIGHT HANDLE STERIS (MISCELLANEOUS) ×4 IMPLANT
DECANTER SPIKE VIAL GLASS SM (MISCELLANEOUS) ×2 IMPLANT
DRAPE C-ARM FOLDED MOBILE STRL (DRAPES) ×2 IMPLANT
DRSG TEGADERM 2-3/8X2-3/4 SM (GAUZE/BANDAGES/DRESSINGS) ×1 IMPLANT
DURAPREP 6ML APPLICATOR 50/CS (WOUND CARE) ×2 IMPLANT
ELECT REM PT RETURN 9FT ADLT (ELECTROSURGICAL) ×2
ELECTRODE REM PT RTRN 9FT ADLT (ELECTROSURGICAL) ×1 IMPLANT
GLOVE BIOGEL PI IND STRL 7.0 (GLOVE) IMPLANT
GLOVE BIOGEL PI IND STRL 7.5 (GLOVE) ×1 IMPLANT
GLOVE BIOGEL PI INDICATOR 7.0 (GLOVE) ×2
GLOVE BIOGEL PI INDICATOR 7.5 (GLOVE) ×1
GLOVE ECLIPSE 6.5 STRL STRAW (GLOVE) ×2 IMPLANT
GLOVE ECLIPSE 7.0 STRL STRAW (GLOVE) ×2 IMPLANT
GLOVE EXAM NITRILE MD LF STRL (GLOVE) ×1 IMPLANT
GOWN STRL REIN XL XLG (GOWN DISPOSABLE) ×4 IMPLANT
IV NS 500ML (IV SOLUTION) ×2
IV NS 500ML BAXH (IV SOLUTION) ×1 IMPLANT
KIT PORT POWER 8FR ISP MRI (CATHETERS) ×2 IMPLANT
KIT ROOM TURNOVER APOR (KITS) ×2 IMPLANT
MANIFOLD NEPTUNE II (INSTRUMENTS) ×2 IMPLANT
NDL HYPO 18GX1.5 BLUNT FILL (NEEDLE) ×1 IMPLANT
NDL HYPO 25X1 1.5 SAFETY (NEEDLE) ×1 IMPLANT
NEEDLE HYPO 18GX1.5 BLUNT FILL (NEEDLE) ×2 IMPLANT
NEEDLE HYPO 25X1 1.5 SAFETY (NEEDLE) ×2 IMPLANT
NS IRRIG 1000ML POUR BTL (IV SOLUTION) ×2 IMPLANT
PACK MINOR (CUSTOM PROCEDURE TRAY) ×2 IMPLANT
PAD ARMBOARD 7.5X6 YLW CONV (MISCELLANEOUS) ×2 IMPLANT
SET BASIN LINEN APH (SET/KITS/TRAYS/PACK) ×2 IMPLANT
SET INTRODUCER 12FR PACEMAKER (SHEATH) IMPLANT
SHEATH COOK PEEL AWAY SET 8F (SHEATH) IMPLANT
SPONGE GAUZE 2X2 8PLY STRL LF (GAUZE/BANDAGES/DRESSINGS) ×1 IMPLANT
STRIP CLOSURE SKIN 1/2X4 (GAUZE/BANDAGES/DRESSINGS) ×2 IMPLANT
SUT MNCRL AB 4-0 PS2 18 (SUTURE) ×2 IMPLANT
SUT VIC AB 3-0 SH 27 (SUTURE) ×2
SUT VIC AB 3-0 SH 27X BRD (SUTURE) ×1 IMPLANT
SYR CONTROL 10ML LL (SYRINGE) ×2 IMPLANT
SYRINGE 10CC LL (SYRINGE) ×2 IMPLANT

## 2012-10-14 NOTE — Progress Notes (Signed)
Patient off floor much of the day, underwent left subclavian Port-A-Cath placement by Dr. Leticia Penna. Most recent cardiology followup and plan reviewed. She is on Lasix 80 mg IV twice daily, had 2800 cc out more than in over the last 24 hours. Telemetry shows sinus rhythm with PACs, no NSVT. Renal function relatively stable, creatinine down from 1.5 x 1.3 with GFR 40.   Medications reviewed including aspirin, Lipitor, digoxin, Cardizem CD, Lasix, potassium supplements. No specific changes made today. Our service will followup again tomorrow.  Jonelle Sidle, M.D., F.A.C.C.

## 2012-10-14 NOTE — Progress Notes (Signed)
PT NPO FOR SURGERY. TO HAVE PORT-A-CATH INSERTED. CBG 61  1 AMP D50 GIVEN IV PER HYPOGLYCEMIC PROTOCOL.

## 2012-10-14 NOTE — Progress Notes (Signed)
SUSAN FROM [facility] CAME AND SPOKE WITH PT AND HUSBAND ABOUT PT DOING REHAB AT [facility]. PT IS IN AGREEMENT. POSS TRANSFER TOMORROW.

## 2012-10-14 NOTE — Progress Notes (Signed)
Inpatient Diabetes Program Recommendations  AACE/ADA: New Consensus Statement on Inpatient Glycemic Control (2013)  Target Ranges:  Prepandial:   less than 140 mg/dL      Peak postprandial:   less than 180 mg/dL (1-2 hours)      Critically ill patients:  140 - 180 mg/dL   Results for JENI, DULING (MRN [id number]) as of 10/14/2012 09:05  Ref. Range 10/13/2012 07:52 10/13/2012 12:11 10/13/2012 16:13 10/13/2012 21:11  Glucose-Capillary Latest Range: 70-99 mg/dL 75 409 (H) 811 (H) 914 (H)  Results for SHERESE, HEYWARD (MRN [id number]) as of 10/14/2012 09:05  Ref. Range 10/14/2012 05:06  Glucose Latest Range: 70-99 mg/dL 61 (L)    Inpatient Diabetes Program Recommendations Insulin - Basal: Please consider decreasing Levemir to 50 units QHS.    Note: Patient has been NPO since midnight for Portacath placement today.  Also, noted that steroids are being decreased and changed from IV to PO.  Fasting blood glucose this morning was 61 mg/dl.  Please consider decreasing Levemir to 50 units QHS.    Thanks, Orlando Penner, RN, MSN, CCRN Diabetes Coordinator Inpatient Diabetes Program [phone]

## 2012-10-14 NOTE — Op Note (Signed)
Patient:  Ann [name]  DOB:  1946/05/07  MRN:  [id number]   Preop Diagnosis:  Chronic thrombophlebitis and chronic antibiotic usage, chronic right arm cellulitis  Postop Diagnosis:  The same  Procedure:  Left subclavian vein Port-A-Cath insertion with fluoroscopy and intraoperative surgeon interpretation  Surgeon:  Dr. Tilford Pillar  Anes:  MAC, 0.5% Sensorcaine plain for local  Indications:  Patient is a 67 year old female with multiple medical problems including chronic cellulitis of the right arm. She is on chronic antibiotic coverage. She has limited IV access. Options for placement for a Port-A-Cath were discussed with patient. Her PCP is fully advising patient port placement. Risks benefits alternatives including but not limited to risk of bleeding, infection, dysfunction, pneumothorax or other discussed. Patient's concerns are addressed patient's consented for planned procedure.  Procedure note:   patient is taken to the operating room and placed into a supine position at which time the MAC sedation is administered at this point her left chest and neck were prepped with chlorhexidine solution and draped in standard fashion. Time out was performed. Local anesthetic is a still on the planned course of venipuncture. Venous return was obtained. The introducer needle was utilized to identify the left subclavian vein again venous return was obtained a J-wire is advanced. I did have to reposition the angulation of the needle through a separate advancement of the introducer needle T2 progression of the J-wire initially into the internal jugular on the left side. 3 present sectioning allowed me to advance the J-wire into the superior vena cava. At this point the course of the J-wire was confirmed with fluoroscopy. The J-wire straight to the drapes with a hemostat. The local anesthetic is instilled along the planned site of the port insertion. The skin incision made with a 15 blade scalpel and the  pocket is enlarged with a combination of blunt and electrocautery dissection. The catheter is advanced from the port site to the stab incision site with a subcuticular tunneling device. The introducer dilator sheath combination was advanced without difficulty down the J-wire under visualization with fluoroscopy. The J-wire and dilator removed in standard fashion and the catheter is advanced. The sheath was split and removed in standard fashion after the position of the catheter is confirmed. The catheters from the 20 for a half centimeters. It is connected to the port. The port is aspirated with good venous return. It is instilled with 3000 units of heparin. It is secured in the pocket with Vicryl suture. The course of the catheter is visualized with fluoroscopy and there is no noted kinking or sharp angulation. At this time the pocket site is closed with a 3-0 Vicryl and running continuous fashion. The skin edges at both sites are closed with a 4 Monocryl in running subcutaneous suture. The skin was washed dried moist dry towel. Benzoin is applied around incision. Half-inch are suture placed. The Steri-Strips are covered with Tegaderm dressing. The drapes removed patient left come out of sedation and stretcher to the PACU in stable condition. At the conclusion of procedure all instrument, sponge, needle counts are correct. Patient tolerated procedure she well.  Complications:  None apparent   EBL:  Less than 50 ML  Specimen: None

## 2012-10-14 NOTE — Anesthesia Preprocedure Evaluation (Signed)
Anesthesia Evaluation  Patient identified by MRN, date of birth, ID band Patient awake    Reviewed: Allergy & Precautions, H&P , NPO status , Patient's Chart, lab work & pertinent test results  History of Anesthesia Complications Negative for: history of anesthetic complications  Airway Mallampati: II      Dental  (+) Edentulous Upper   Pulmonary COPD COPD inhaler,    + decreased breath sounds      Cardiovascular hypertension, - angina+ CAD and + Cardiac Stents + dysrhythmias Atrial Fibrillation and Ventricular Tachycardia Rhythm:Regular Rate:Normal     Neuro/Psych    GI/Hepatic hiatal hernia, GERD-  Medicated,  Endo/Other  diabetes, Well Controlled, Type 2, Oral Hypoglycemic AgentsHypothyroidism Morbid obesityAdrenal insufficiency   Renal/GU ARFRenal disease     Musculoskeletal   Abdominal (+) + obese,   Peds  Hematology   Anesthesia Other Findings   Reproductive/Obstetrics                           Anesthesia Physical Anesthesia Plan  ASA: IV  Anesthesia Plan: MAC   Post-op Pain Management:    Induction: Intravenous  Airway Management Planned: Simple Face Mask  Additional Equipment:   Intra-op Plan:   Post-operative Plan:   Informed Consent: I have reviewed the patients History and Physical, chart, labs and discussed the procedure including the risks, benefits and alternatives for the proposed anesthesia with the patient or authorized representative who has indicated his/her understanding and acceptance.     Plan Discussed with:   Anesthesia Plan Comments:         Anesthesia Quick Evaluation

## 2012-10-14 NOTE — Anesthesia Postprocedure Evaluation (Addendum)
  Anesthesia Post-op Note  Patient: Ann [name]  Procedure(s) Performed: Procedure(s): INSERTION PORT-A-CATH (Left)  Patient Location: PACU  Anesthesia Type:MAC  Level of Consciousness: awake, alert , oriented and patient cooperative  Airway and Oxygen Therapy: Patient Spontanous Breathing and Patient connected to nasal cannula oxygen  Post-op Pain: none  Post-op Assessment: Post-op Vital signs reviewed, Patient's Cardiovascular Status Stable, Respiratory Function Stable, Patent Airway and No signs of Nausea or vomiting  Post-op Vital Signs: Reviewed and stable  Complications: No apparent anesthesia complications 10/15/12 VSS, patient felt anesthesia was fine.  Other than some soreness at insertion site, no apparent anesthesia complications.

## 2012-10-14 NOTE — Transfer of Care (Signed)
Immediate Anesthesia Transfer of Care Note  Patient: Ann [name]  Procedure(s) Performed: Procedure(s): INSERTION PORT-A-CATH (Left)  Patient Location: PACU  Anesthesia Type:MAC  Level of Consciousness: awake, alert , oriented and patient cooperative  Airway & Oxygen Therapy: Patient Spontanous Breathing and Patient connected to nasal cannula oxygen  Post-op Assessment: Report given to PACU RN and Post -op Vital signs reviewed and stable  Post vital signs: Reviewed and stable  Complications: No apparent anesthesia complications

## 2012-10-14 NOTE — Progress Notes (Signed)
TACARA HADLOCK  MRN: [id number]  DOB/AGE: 67-Jan-1947 67 y.o.  Primary Care Physician:HAWKINS,EDWARD L, MD  Admit date: 10/06/2012  Chief Complaint:  Chief Complaint  Patient presents with  . Shortness of Breath    S-Pt presented on  10/06/2012 with  Chief Complaint  Patient presents with  . Shortness of Breath  . Pt offers NO new complaints.   Meds . albuterol  2.5 mg Nebulization Q4H  . aspirin  81 mg Oral Daily  . atorvastatin  80 mg Oral q1800  . calcitRIOL  0.25 mcg Oral Daily  . digoxin  0.125 mg Oral Daily  . diltiazem  120 mg Oral Daily  . fesoterodine  8 mg Oral Daily  . fluconazole  100 mg Oral Daily  . fluticasone  2 spray Each Nare Daily  . furosemide  80 mg Intravenous TID  . gabapentin  100 mg Oral TID  . heparin  5,000 Units Subcutaneous Q8H  . insulin aspart  0-20 Units Subcutaneous TID WC  . insulin aspart  0-5 Units Subcutaneous QHS  . insulin aspart  3 Units Subcutaneous TID WC  . insulin detemir  55 Units Subcutaneous QHS  . ipratropium  500 mcg Nebulization QID  . levothyroxine  200 mcg Oral QAC breakfast  . linezolid  600 mg Oral Q12H  . metoCLOPramide  10 mg Oral TID AC & HS  . metroNIDAZOLE  500 mg Oral Q8H  . mometasone-formoterol  2 puff Inhalation BID  . pantoprazole  40 mg Oral BID AC  . polyethylene glycol  17 g Oral Daily  . potassium chloride  40 mEq Oral TID  . predniSONE  20 mg Oral Q breakfast  . sodium chloride  3 mL Intravenous Q12H  . sucralfate  1 g Oral TID WC & HS  . vitamin B-12  1,000 mcg Oral Daily      Physical Exam: Vital signs in last 24 hours: Temp:  [97.7 F (36.5 C)-98.4 F (36.9 C)] 98 F (36.7 C) (05/21 1230) Pulse Rate:  [66-84] 84 (05/21 1400) Resp:  [10-41] 17 (05/21 1400) BP: (137-193)/(62-147) 171/102 mmHg (05/21 1400) SpO2:  [90 %-100 %] 98 % (05/21 1400) Weight:  [287 lb (130.182 kg)-287 lb 4.2 oz (130.3 kg)] 287 lb (130.182 kg) (05/21 0808) Weight change: -3 lb 4.9 oz (-1.5 kg) Last BM Date:  10/14/12  Intake/Output from previous day: 05/20 0701 - 05/21 0700 In: 2970 [P.O.:1620; I.V.:1050; IV Piggyback:300] Out: 5800 [Urine:5800] Total I/O In: 900 [I.V.:900] Out: 15 [Blood:15] No recorded   Physical Exam: General- pt is awake,alert, oriented to time place and person Resp- No acute REsp distress, Decreased Bs at bases. CVS- S1S2 regular in rate and rhythm GIT- BS+, soft, NT, ND, Morbidly obese. EXT- Trace LE Edema, NO Cyanosis.   Lab Results: CBC  Recent Labs  10/14/12 0506  WBC 10.5  HGB 9.6*  HCT 29.6*  PLT 306    BMET  Recent Labs  10/13/12 0451 10/14/12 0506  NA 144 142  K 2.7* 3.1*  CL 104 100  CO2 30 31  GLUCOSE 90 61*  BUN 28* 27*  CREATININE 1.54* 1.34*  CALCIUM 8.2* 8.2*    Trend Creat  2014 0.9==>4.5 ==>2.28==>1.5==>1.34 AKI in March 3.6==>1.5  AKi in Feb 2.5==>1.0   MICRO Recent Results (from the past 240 hour(s))  CULTURE, BLOOD (ROUTINE X 2)     Status: None   Collection Time    10/06/12  4:50 PM      Result  Value Range Status   Specimen Description BLOOD RIGHT ANTECUBITAL   Final   Special Requests BOTTLES DRAWN AEROBIC AND ANAEROBIC 8CC   Final   Culture NO GROWTH 5 DAYS   Final   Report Status 10/11/2012 FINAL   Final  CULTURE, BLOOD (ROUTINE X 2)     Status: None   Collection Time    10/06/12  4:55 PM      Result Value Range Status   Specimen Description BLOOD LEFT ANTECUBITAL   Final   Special Requests     Final   Value: BOTTLES DRAWN AEROBIC AND ANAEROBIC AEB=15CC ANA=5CC   Culture NO GROWTH 5 DAYS   Final   Report Status 10/11/2012 FINAL   Final  URINE CULTURE     Status: None   Collection Time    10/06/12  6:41 PM      Result Value Range Status   Specimen Description URINE, CATHETERIZED   Final   Special Requests NONE   Final   Culture  Setup Time 10/06/2012 19:00   Final   Colony Count >=100,000 COLONIES/ML   Final   Culture     Final   Value: VANCOMYCIN RESISTANT ENTEROCOCCUS ISOLATED     Note:  CRITICAL RESULT CALLED TO, READ BACK BY AND VERIFIED WITH: MELANE SIMPSON @ 2031 ON 10/09/2012   Report Status 10/09/2012 FINAL   Final   Organism ID, Bacteria VANCOMYCIN RESISTANT ENTEROCOCCUS ISOLATED   Final  CLOSTRIDIUM DIFFICILE BY PCR     Status: Abnormal   Collection Time    10/11/12  2:30 PM      Result Value Range Status   C difficile by pcr POSITIVE (*) NEGATIVE Final   Comment: CRITICAL RESULT CALLED TO, READ BACK BY AND VERIFIED WITH:     TOLER M. AT 1617 ON [id number] BY THOMPSON S.      Lab Results  Component Value Date   PTH 177.0* 06/28/2012   CALCIUM 8.2* 10/14/2012   PHOS 2.7 08/04/2012        Impression: 1)Renal AKI secondary to Prerenal/ ATN  AKI on CKD AKI most likely Prerenal AKI now much better. Pt most likley has CKD as CT scan  has shown hydro earlier.   2)CVs Afib           Cardiology following.  3)Anemia HGb at goal (9--11)    4)CKD Mineral-Bone Disorder  PTH elevated .  Secondary Hyperparathyroidism Present.  Phosphorus at goal.   5)ID-admitted with Cellulitis /UTI Now C diff positive Clinically better  6)FEN   Hypokalemic  secondary to Diuretics + Diarrhea    being replaced.  NOrmonatremic   7)Acid base  Co2 at goal     Plan:   Will change lasix to BID as pt having diarrhea from C-diff     BHUTANI,MANPREET S 10/14/2012, 3:03 PM

## 2012-10-14 NOTE — Progress Notes (Signed)
Physical Therapy Treatment Patient Details Name: Ann [name] MRN: [id number] DOB: [birth year] Today's Date: 10/14/2012 Time:  -     PT Assessment / Plan / Recommendation Comments on Treatment Session  Pt requested hold on PT today.  Pt has had a busy day and limited by Lt shoulder pain.  Wishes to rest the rest of today.       PT Goals    Visit Information  Reason Eval/Treat Not Completed: Other (comment) (Pt requested hold on PT today)    Subjective Data  Subjective: Pt request hold on therapy today, has had a busy day and limited by Lt shoulder pain.  Nursing has been informed and pt given pain medication and ice on shoulder for pain control.        Juel Burrow 10/14/2012, 2:39 PM

## 2012-10-14 NOTE — Preoperative (Signed)
Beta Blockers   Reason not to administer Beta Blockers:Not Applicable 

## 2012-10-14 NOTE — Anesthesia Procedure Notes (Signed)
Procedure Name: MAC Date/Time: 10/14/2012 11:07 AM Performed by: Carolyne Littles, AMY L Pre-anesthesia Checklist: Patient identified, Timeout performed, Emergency Drugs available, Suction available and Patient being monitored Oxygen Delivery Method: Non-rebreather mask

## 2012-10-14 NOTE — Progress Notes (Signed)
Subjective: She feels better. She has no new complaints. She has been diuresing. She is set for Port-A-Cath placement today  Objective: Vital signs in last 24 hours: Temp:  [97.8 F (36.6 C)-98.4 F (36.9 C)] 98.1 F (36.7 C) (05/21 0400) Pulse Rate:  [57-89] 68 (05/21 0700) Resp:  [12-22] 14 (05/21 0700) BP: (134-166)/(49-84) 153/84 mmHg (05/21 0700) SpO2:  [93 %-100 %] 96 % (05/21 0711) Weight:  [130.3 kg (287 lb 4.2 oz)] 130.3 kg (287 lb 4.2 oz) (05/21 0300) Weight change: -1.5 kg (-3 lb 4.9 oz) Last BM Date: 10/13/12  Intake/Output from previous day: 05/20 0701 - 05/21 0700 In: 2970 [P.O.:1620; I.V.:1050; IV Piggyback:300] Out: 5800 [Urine:5800]  PHYSICAL EXAM General appearance: alert, cooperative, no distress and morbidly obese Resp: clear to auscultation bilaterally Cardio: irregularly irregular rhythm GI: soft, non-tender; bowel sounds normal; no masses,  no organomegaly Extremities: extremities normal, atraumatic, no cyanosis or edema and venous stasis dermatitis noted  Lab Results:    Basic Metabolic Panel:  Recent Labs  40/98/11 0451 10/14/12 0506  NA 144 142  K 2.7* 3.1*  CL 104 100  CO2 30 31  GLUCOSE 90 61*  BUN 28* 27*  CREATININE 1.54* 1.34*  CALCIUM 8.2* 8.2*   Liver Function Tests: No results found for this basename: AST, ALT, ALKPHOS, BILITOT, PROT, ALBUMIN,  in the last 72 hours No results found for this basename: LIPASE, AMYLASE,  in the last 72 hours No results found for this basename: AMMONIA,  in the last 72 hours CBC:  Recent Labs  10/14/12 0506  WBC 10.5  HGB 9.6*  HCT 29.6*  MCV 99.7  PLT 306   Cardiac Enzymes: No results found for this basename: CKTOTAL, CKMB, CKMBINDEX, TROPONINI,  in the last 72 hours BNP:  Recent Labs  10/12/12 1016  PROBNP 14169.0*   D-Dimer: No results found for this basename: DDIMER,  in the last 72 hours CBG:  Recent Labs  10/12/12 1618 10/12/12 2158 10/13/12 0752 10/13/12 1211  10/13/12 1613 10/13/12 2111  GLUCAP 108* 122* 75 111* 111* 141*   Hemoglobin A1C: No results found for this basename: HGBA1C,  in the last 72 hours Fasting Lipid Panel: No results found for this basename: CHOL, HDL, LDLCALC, TRIG, CHOLHDL, LDLDIRECT,  in the last 72 hours Thyroid Function Tests: No results found for this basename: TSH, T4TOTAL, FREET4, T3FREE, THYROIDAB,  in the last 72 hours Anemia Panel: No results found for this basename: VITAMINB12, FOLATE, FERRITIN, TIBC, IRON, RETICCTPCT,  in the last 72 hours Coagulation: No results found for this basename: LABPROT, INR,  in the last 72 hours Urine Drug Screen: Drugs of Abuse  No results found for this basename: labopia, cocainscrnur, labbenz, amphetmu, thcu, labbarb    Alcohol Level: No results found for this basename: ETH,  in the last 72 hours Urinalysis: No results found for this basename: COLORURINE, APPERANCEUR, LABSPEC, PHURINE, GLUCOSEU, HGBUR, BILIRUBINUR, KETONESUR, PROTEINUR, UROBILINOGEN, NITRITE, LEUKOCYTESUR,  in the last 72 hours Misc. Labs:  ABGS No results found for this basename: PHART, PCO2, PO2ART, TCO2, HCO3,  in the last 72 hours CULTURES Recent Results (from the past 240 hour(s))  CULTURE, BLOOD (ROUTINE X 2)     Status: None   Collection Time    10/06/12  4:50 PM      Result Value Range Status   Specimen Description BLOOD RIGHT ANTECUBITAL   Final   Special Requests BOTTLES DRAWN AEROBIC AND ANAEROBIC 8CC   Final   Culture NO GROWTH  5 DAYS   Final   Report Status 10/11/2012 FINAL   Final  CULTURE, BLOOD (ROUTINE X 2)     Status: None   Collection Time    10/06/12  4:55 PM      Result Value Range Status   Specimen Description BLOOD LEFT ANTECUBITAL   Final   Special Requests     Final   Value: BOTTLES DRAWN AEROBIC AND ANAEROBIC AEB=15CC ANA=5CC   Culture NO GROWTH 5 DAYS   Final   Report Status 10/11/2012 FINAL   Final  URINE CULTURE     Status: None   Collection Time    10/06/12  6:41 PM       Result Value Range Status   Specimen Description URINE, CATHETERIZED   Final   Special Requests NONE   Final   Culture  Setup Time 10/06/2012 19:00   Final   Colony Count >=100,000 COLONIES/ML   Final   Culture     Final   Value: VANCOMYCIN RESISTANT ENTEROCOCCUS ISOLATED     Note: CRITICAL RESULT CALLED TO, READ BACK BY AND VERIFIED WITH: MELANE SIMPSON @ 2031 ON 10/09/2012   Report Status 10/09/2012 FINAL   Final   Organism ID, Bacteria VANCOMYCIN RESISTANT ENTEROCOCCUS ISOLATED   Final  CLOSTRIDIUM DIFFICILE BY PCR     Status: Abnormal   Collection Time    10/11/12  2:30 PM      Result Value Range Status   C difficile by pcr POSITIVE (*) NEGATIVE Final   Comment: CRITICAL RESULT CALLED TO, READ BACK BY AND VERIFIED WITH:     TOLER M. AT 1617 ON [id number] BY THOMPSON S.   Studies/Results: No results found.  Medications:  Prior to Admission:  Prescriptions prior to admission  Medication Sig Dispense Refill  . albuterol (PROVENTIL HFA;VENTOLIN HFA) 108 (90 BASE) MCG/ACT inhaler Inhale 2 puffs into the lungs every 6 (six) hours as needed for wheezing.  1 Inhaler  2  . ALPRAZolam (XANAX) 0.5 MG tablet Take 0.5 mg by mouth 3 (three) times daily as needed for sleep.       Marland Kitchen aspirin 81 MG chewable tablet Chew 81 mg by mouth daily.      . calcitRIOL (ROCALTROL) 0.25 MCG capsule Take 0.25 mcg by mouth daily.      . digoxin (LANOXIN) 0.125 MG tablet Take 1 tablet (0.125 mg total) by mouth daily.  30 tablet  12  . diltiazem (CARDIZEM) 120 MG tablet Take 1 tablet (120 mg total) by mouth daily.  30 tablet  12  . fluticasone (FLONASE) 50 MCG/ACT nasal spray Place 2 sprays into the nose daily.      . Fluticasone-Salmeterol (ADVAIR DISKUS) 250-50 MCG/DOSE AEPB Inhale 1 puff into the lungs 2 (two) times daily.  60 each  12  . gabapentin (NEURONTIN) 100 MG capsule Take 100 mg by mouth 3 (three) times daily.      Marland Kitchen HYDROcodone-acetaminophen (NORCO) 5-325 MG per tablet Take 1 tablet by mouth every  6 (six) hours as needed for pain.  30 tablet  0  . insulin aspart (NOVOLOG) 100 UNIT/ML injection Inject 3-15 Units into the skin 3 (three) times daily before meals. 121-150=3 units, 151-200=4 units, 201-250=7 units, 251-300=9 units, 301-350=12 units, 351-400=15 units.      . insulin detemir (LEVEMIR) 100 UNIT/ML injection Inject 50 Units into the skin at bedtime.       Marland Kitchen ipratropium (ATROVENT) 0.02 % nebulizer solution Take 2.5 mLs (500 mcg total) by nebulization  4 (four) times daily.  75 mL  12  . levalbuterol (XOPENEX) 0.63 MG/3ML nebulizer solution Take 3 mLs (0.63 mg total) by nebulization every 4 (four) hours as needed for wheezing.  3 mL  12  . levothyroxine (SYNTHROID, LEVOTHROID) 200 MCG tablet Take 200 mcg by mouth daily before breakfast.      . ondansetron (ZOFRAN) 4 MG tablet Take 1 tablet (4 mg total) by mouth every 6 (six) hours as needed for nausea.  20 tablet  5  . pantoprazole (PROTONIX) 40 MG tablet Take 1 tablet (40 mg total) by mouth 2 (two) times daily before a meal.  60 tablet  12  . polyethylene glycol (MIRALAX / GLYCOLAX) packet Take 17 g by mouth daily.      . predniSONE (DELTASONE) 5 MG tablet Take 2 tablets (10 mg total) by mouth daily.  60 tablet  12  . rosuvastatin (CRESTOR) 40 MG tablet Take 1 tablet (40 mg total) by mouth daily.  30 tablet  12  . sertraline (ZOLOFT) 50 MG tablet Take 1 tablet (50 mg total) by mouth daily.  30 tablet  12  . tolterodine (DETROL LA) 4 MG 24 hr capsule Take 1 capsule (4 mg total) by mouth daily.  30 capsule  12  . torsemide (DEMADEX) 20 MG tablet Take 1 tablet (20 mg total) by mouth 2 (two) times daily.  30 tablet  12  . vitamin B-12 (CYANOCOBALAMIN) 1000 MCG tablet Take 1,000 mcg by mouth daily.      . metoCLOPramide (REGLAN) 10 MG tablet Take 1 tablet (10 mg total) by mouth 4 (four) times daily -  before meals and at bedtime.  90 tablet  12   Scheduled: . [facility] HOLD] albuterol  2.5 mg Nebulization Q4H  . [facility] HOLD] aspirin  81 mg Oral  Daily  . [facility] HOLD] atorvastatin  80 mg Oral q1800  . [facility] HOLD] calcitRIOL  0.25 mcg Oral Daily  . chlorhexidine  1 application Topical Once  . dextrose      . [facility] HOLD] digoxin  0.125 mg Oral Daily  . [facility] HOLD] diltiazem  120 mg Oral Daily  . [facility] HOLD] fesoterodine  8 mg Oral Daily  . [facility] HOLD] fluconazole  100 mg Oral Daily  . [[facility] HOLD] fluticasone  2 spray Each Nare Daily  . [facility] HOLD] furosemide  80 mg Intravenous TID  . [facility] HOLD] gabapentin  100 mg Oral TID  . [[facility] HOLD] heparin  5,000 Units Subcutaneous Q8H  . [facility] HOLD] hydrocortisone sod succinate (SOLU-CORTEF) inj  50 mg Intravenous BID  . [[facility] HOLD] insulin aspart  0-20 Units Subcutaneous TID WC  . [[facility] HOLD] insulin aspart  0-5 Units Subcutaneous QHS  . [[facility] HOLD] insulin aspart  3 Units Subcutaneous TID WC  . [[facility] HOLD] insulin detemir  55 Units Subcutaneous QHS  . [[facility] HOLD] ipratropium  500 mcg Nebulization QID  . [facility] HOLD] levothyroxine  200 mcg Oral QAC breakfast  . [[facility] HOLD] linezolid  600 mg Oral Q12H  . [facility] HOLD] metoCLOPramide  10 mg Oral TID AC & HS  . [[facility] HOLD] metroNIDAZOLE  500 mg Oral Q8H  . [[facility] HOLD] mometasone-formoterol  2 puff Inhalation BID  . [[facility] HOLD] pantoprazole  40 mg Oral BID AC  . [[facility] HOLD] polyethylene glycol  17 g Oral Daily  . [facility] HOLD] potassium chloride  40 mEq Oral BID  . [[facility] HOLD] sodium chloride  3 mL Intravenous Q12H  . [  [facility] HOLD] sucralfate  1 g Oral TID WC & HS  . [[facility] HOLD] vitamin B-12  1,000 mcg Oral Daily   Continuous: . sodium chloride 0.45 % with kcl 50 mL/hr at 10/14/12 0600   PRN:[[facility] HOLD] albuterol, [[facility] HOLD] ALPRAZolam, [[facility] HOLD] alum & mag hydroxide-simeth, [[facility] HOLD] HYDROcodone-acetaminophen, [[facility] HOLD] loperamide, [MAR HOLD] nitroGLYCERIN, [[facility] HOLD] ondansetron (ZOFRAN) IV, [[facility] HOLD] ondansetron, [MAR HOLD] ondansetron  Assesment: She was admitted with cellulitis of the right arm. She also had acute renal failure which appeared to be from acute  tubular necrosis. She has chronic kidney disease as well. She has atrial fibrillation. She has vancomycin resistant enterococcus in her urine. She had chest pain but did not seem to be cardiac in nature. She had nonsustained ventricular tachycardia. She is diabetic and that is doing somewhat better. She has acute on chronic diastolic heart failure that is improving with diuresis. She has adrenal insufficiency and she's on IV steroids now but that will changed Principal Problem:   Cellulitis of arm, right Active Problems:   HYPOTHYROIDISM   Edema   Diabetes mellitus type 2 with complications   Chronic diastolic heart failure   Adrenal insufficiency   ARF (acute renal failure)   Morbid obesity   Chronic kidney disease, stage III (moderate)   Atrial fibrillation   UTI (urinary tract infection)   Acute renal failure (ARF)   VRE (vancomycin resistant enterococcus) culture positive   Chest pain   Nonsustained ventricular tachycardia    Plan: I will switch her to prednisone. She will continue with her treatments.    LOS: 8 days   Abem Shaddix L 10/14/2012, 7:56 AM

## 2012-10-14 NOTE — Progress Notes (Signed)
Day of Surgery  Subjective: No acute change. New questions.  Objective: Vital signs in last 24 hours: Temp:  [97.7 F (36.5 [name])-98.4 F (36.9 [name])] 97.7 F (36.5 [name]) (05/21 0808) Pulse Rate:  [57-89] 70 (05/21 0808) Resp:  [10-22] 10 (05/21 0845) BP: (134-166)/(49-87) 147/80 mmHg (05/21 0845) SpO2:  [90 %-100 %] 94 % (05/21 0845) Weight:  [130.182 kg (287 lb)-130.3 kg (287 lb 4.2 oz)] 130.182 kg (287 lb) (05/21 0808) Last BM Date: 10/14/12  Intake/Output from previous day: 05/20 0701 - 05/21 0700 In: 2970 [P.O.:1620; I.V.:1050; IV Piggyback:300] Out: 5800 [Urine:5800] Intake/Output this shift:    General appearance: alert and no distress Skin: Erythema right arm  Lab Results:   Recent Labs  10/14/12 0506  WBC 10.5  HGB 9.6*  HCT 29.6*  PLT 306   BMET  Recent Labs  10/13/12 0451 10/14/12 0506  NA 144 142  K 2.7* 3.1*  CL 104 100  CO2 30 31  GLUCOSE 90 61*  BUN 28* 27*  CREATININE 1.54* 1.34*  CALCIUM 8.2* 8.2*   PT/INR No results found for this basename: LABPROT, INR,  in the last 72 hours ABG No results found for this basename: PHART, PCO2, PO2, HCO3,  in the last 72 hours  Studies/Results: No results found.  Anti-infectives: Anti-infectives   Start     Dose/Rate Route Frequency Ordered Stop   10/12/12 2200  [MAR Hold]  linezolid (ZYVOX) tablet 600 mg     (On MAR Hold since 10/14/12 0750)   600 mg Oral Every 12 hours 10/12/12 1103     10/12/12 1200  [MAR Hold]  fluconazole (DIFLUCAN) tablet 100 mg     (On MAR Hold since 10/14/12 0750)   100 mg Oral Daily 10/12/12 1100     10/12/12 0830  [MAR Hold]  metroNIDAZOLE (FLAGYL) tablet 500 mg     (On MAR Hold since 10/14/12 0750)   500 mg Oral 3 times per day 10/12/12 0815 10/26/12 0559   10/10/12 1100  fluconazole (DIFLUCAN) IVPB 100 mg  Status:  Discontinued     100 mg 50 mL/hr over 60 Minutes Intravenous Every 24 hours 10/10/12 0901 10/12/12 1100   10/10/12 1000  linezolid (ZYVOX) IVPB 600 mg  Status:   Discontinued     600 mg 300 mL/hr over 60 Minutes Intravenous Every 12 hours 10/10/12 0855 10/12/12 1101   10/09/12 1000  vancomycin (VANCOCIN) 1,500 mg in sodium chloride 0.9 % 500 mL IVPB  Status:  Discontinued     1,500 mg 250 mL/hr over 120 Minutes Intravenous Every 48 hours 10/09/12 0740 10/10/12 0912   10/07/12 1200  vancomycin (VANCOCIN) 1,500 mg in sodium chloride 0.9 % 500 mL IVPB     1,500 mg 250 mL/hr over 120 Minutes Intravenous  Once 10/07/12 1036 10/07/12 1411   10/07/12 1000  azithromycin (ZITHROMAX) tablet 250 mg  Status:  Discontinued     250 mg Oral Daily 10/06/12 1626 10/06/12 1757   10/06/12 1930  vancomycin (VANCOCIN) 1,500 mg in sodium chloride 0.9 % 500 mL IVPB  Status:  Discontinued     1,500 mg 250 mL/hr over 120 Minutes Intravenous  Once 10/06/12 1837 10/08/12 0859   10/06/12 1800  cefTRIAXone (ROCEPHIN) 1 g in dextrose 5 % 50 mL IVPB  Status:  Discontinued     1 g 100 mL/hr over 30 Minutes Intravenous Every 24 hours 10/06/12 1757 10/06/12 1803   10/06/12 1630  azithromycin (ZITHROMAX) tablet 500 mg  500 mg Oral Daily 10/06/12 1626 10/06/12 1742   10/06/12 1600  vancomycin (VANCOCIN) IVPB 1000 mg/200 mL premix     1,000 mg 200 mL/hr over 60 Minutes Intravenous  Once 10/06/12 1551 10/06/12 1830   10/06/12 1600  piperacillin-tazobactam (ZOSYN) IVPB 3.375 g     3.375 g 12.5 mL/hr over 240 Minutes Intravenous  Once 10/06/12 1551 10/06/12 1835      Assessment/Plan: s/p Procedure(s): INSERTION PORT-A-CATH (N/A) Chronic thrombophlebitis and chronic right arm cellulitis. I discussed the case with Dr. Juanetta Gosling who adamantly feels that patient will require long-term access and due to her history of chronic thrombophlebitis and difficult peripheral access including PICC line access would benefit from Port-A-Cath placement. I discussed this again with the patient as well as the risks benefits alternatives. Patient was consented for the planned procedure.  LOS: 8 days     Ann [name] 10/14/2012

## 2012-10-15 ENCOUNTER — Encounter (HOSPITAL_COMMUNITY): Payer: Self-pay | Admitting: General Surgery

## 2012-10-15 DIAGNOSIS — N2581 Secondary hyperparathyroidism of renal origin: Secondary | ICD-10-CM | POA: Diagnosis not present

## 2012-10-15 DIAGNOSIS — J4489 Other specified chronic obstructive pulmonary disease: Secondary | ICD-10-CM | POA: Diagnosis not present

## 2012-10-15 DIAGNOSIS — N189 Chronic kidney disease, unspecified: Secondary | ICD-10-CM | POA: Diagnosis not present

## 2012-10-15 DIAGNOSIS — E46 Unspecified protein-calorie malnutrition: Secondary | ICD-10-CM | POA: Diagnosis not present

## 2012-10-15 DIAGNOSIS — N390 Urinary tract infection, site not specified: Secondary | ICD-10-CM | POA: Diagnosis not present

## 2012-10-15 DIAGNOSIS — L0291 Cutaneous abscess, unspecified: Secondary | ICD-10-CM | POA: Diagnosis not present

## 2012-10-15 DIAGNOSIS — R262 Difficulty in walking, not elsewhere classified: Secondary | ICD-10-CM | POA: Diagnosis not present

## 2012-10-15 DIAGNOSIS — Z6841 Body Mass Index (BMI) 40.0 and over, adult: Secondary | ICD-10-CM | POA: Diagnosis not present

## 2012-10-15 DIAGNOSIS — F411 Generalized anxiety disorder: Secondary | ICD-10-CM | POA: Diagnosis not present

## 2012-10-15 DIAGNOSIS — I472 Ventricular tachycardia: Secondary | ICD-10-CM | POA: Diagnosis not present

## 2012-10-15 DIAGNOSIS — N170 Acute kidney failure with tubular necrosis: Secondary | ICD-10-CM | POA: Diagnosis not present

## 2012-10-15 DIAGNOSIS — D649 Anemia, unspecified: Secondary | ICD-10-CM | POA: Diagnosis not present

## 2012-10-15 DIAGNOSIS — K219 Gastro-esophageal reflux disease without esophagitis: Secondary | ICD-10-CM | POA: Diagnosis not present

## 2012-10-15 DIAGNOSIS — M549 Dorsalgia, unspecified: Secondary | ICD-10-CM | POA: Diagnosis not present

## 2012-10-15 DIAGNOSIS — J984 Other disorders of lung: Secondary | ICD-10-CM | POA: Diagnosis not present

## 2012-10-15 DIAGNOSIS — E785 Hyperlipidemia, unspecified: Secondary | ICD-10-CM | POA: Diagnosis not present

## 2012-10-15 DIAGNOSIS — Z86718 Personal history of other venous thrombosis and embolism: Secondary | ICD-10-CM | POA: Diagnosis not present

## 2012-10-15 DIAGNOSIS — E039 Hypothyroidism, unspecified: Secondary | ICD-10-CM | POA: Diagnosis not present

## 2012-10-15 DIAGNOSIS — I5033 Acute on chronic diastolic (congestive) heart failure: Secondary | ICD-10-CM | POA: Diagnosis not present

## 2012-10-15 DIAGNOSIS — E119 Type 2 diabetes mellitus without complications: Secondary | ICD-10-CM | POA: Diagnosis not present

## 2012-10-15 DIAGNOSIS — E876 Hypokalemia: Secondary | ICD-10-CM | POA: Diagnosis not present

## 2012-10-15 DIAGNOSIS — J441 Chronic obstructive pulmonary disease with (acute) exacerbation: Secondary | ICD-10-CM | POA: Diagnosis not present

## 2012-10-15 DIAGNOSIS — B952 Enterococcus as the cause of diseases classified elsewhere: Secondary | ICD-10-CM | POA: Diagnosis not present

## 2012-10-15 DIAGNOSIS — I5031 Acute diastolic (congestive) heart failure: Secondary | ICD-10-CM | POA: Diagnosis not present

## 2012-10-15 DIAGNOSIS — G4733 Obstructive sleep apnea (adult) (pediatric): Secondary | ICD-10-CM | POA: Diagnosis not present

## 2012-10-15 DIAGNOSIS — J449 Chronic obstructive pulmonary disease, unspecified: Secondary | ICD-10-CM | POA: Diagnosis not present

## 2012-10-15 DIAGNOSIS — L03119 Cellulitis of unspecified part of limb: Secondary | ICD-10-CM | POA: Diagnosis not present

## 2012-10-15 DIAGNOSIS — Z794 Long term (current) use of insulin: Secondary | ICD-10-CM | POA: Diagnosis not present

## 2012-10-15 DIAGNOSIS — A4902 Methicillin resistant Staphylococcus aureus infection, unspecified site: Secondary | ICD-10-CM | POA: Diagnosis not present

## 2012-10-15 DIAGNOSIS — J189 Pneumonia, unspecified organism: Secondary | ICD-10-CM | POA: Diagnosis not present

## 2012-10-15 DIAGNOSIS — Z7901 Long term (current) use of anticoagulants: Secondary | ICD-10-CM | POA: Diagnosis not present

## 2012-10-15 DIAGNOSIS — N179 Acute kidney failure, unspecified: Secondary | ICD-10-CM | POA: Diagnosis not present

## 2012-10-15 DIAGNOSIS — IMO0002 Reserved for concepts with insufficient information to code with codable children: Secondary | ICD-10-CM | POA: Diagnosis not present

## 2012-10-15 DIAGNOSIS — B958 Unspecified staphylococcus as the cause of diseases classified elsewhere: Secondary | ICD-10-CM | POA: Diagnosis not present

## 2012-10-15 DIAGNOSIS — E1149 Type 2 diabetes mellitus with other diabetic neurological complication: Secondary | ICD-10-CM | POA: Diagnosis not present

## 2012-10-15 DIAGNOSIS — A0472 Enterocolitis due to Clostridium difficile, not specified as recurrent: Secondary | ICD-10-CM | POA: Diagnosis not present

## 2012-10-15 DIAGNOSIS — I4891 Unspecified atrial fibrillation: Secondary | ICD-10-CM | POA: Diagnosis not present

## 2012-10-15 DIAGNOSIS — I509 Heart failure, unspecified: Secondary | ICD-10-CM | POA: Diagnosis not present

## 2012-10-15 DIAGNOSIS — I129 Hypertensive chronic kidney disease with stage 1 through stage 4 chronic kidney disease, or unspecified chronic kidney disease: Secondary | ICD-10-CM | POA: Diagnosis not present

## 2012-10-15 DIAGNOSIS — G8929 Other chronic pain: Secondary | ICD-10-CM | POA: Diagnosis not present

## 2012-10-15 DIAGNOSIS — E1142 Type 2 diabetes mellitus with diabetic polyneuropathy: Secondary | ICD-10-CM | POA: Diagnosis not present

## 2012-10-15 DIAGNOSIS — E2749 Other adrenocortical insufficiency: Secondary | ICD-10-CM | POA: Diagnosis not present

## 2012-10-15 DIAGNOSIS — M6281 Muscle weakness (generalized): Secondary | ICD-10-CM | POA: Diagnosis not present

## 2012-10-15 DIAGNOSIS — D696 Thrombocytopenia, unspecified: Secondary | ICD-10-CM | POA: Diagnosis not present

## 2012-10-15 DIAGNOSIS — J9600 Acute respiratory failure, unspecified whether with hypoxia or hypercapnia: Secondary | ICD-10-CM | POA: Diagnosis not present

## 2012-10-15 DIAGNOSIS — K5289 Other specified noninfective gastroenteritis and colitis: Secondary | ICD-10-CM | POA: Diagnosis not present

## 2012-10-15 DIAGNOSIS — I2510 Atherosclerotic heart disease of native coronary artery without angina pectoris: Secondary | ICD-10-CM | POA: Diagnosis not present

## 2012-10-15 LAB — GLUCOSE, CAPILLARY: Glucose-Capillary: 104 mg/dL — ABNORMAL HIGH (ref 70–99)

## 2012-10-15 MED ORDER — LOPERAMIDE HCL 2 MG PO CAPS: 2.0000 mg | ORAL_CAPSULE | ORAL | Status: DC | PRN

## 2012-10-15 MED ORDER — ONDANSETRON HCL 4 MG PO TABS: 4.0000 mg | ORAL_TABLET | Freq: Four times a day (QID) | ORAL | Status: DC | PRN

## 2012-10-15 MED ORDER — METRONIDAZOLE 500 MG PO TABS: 500.0000 mg | ORAL_TABLET | Freq: Three times a day (TID) | ORAL | Status: DC

## 2012-10-15 MED ORDER — ALUM & MAG HYDROXIDE-SIMETH 200-200-20 MG/5ML PO SUSP: 30.0000 mL | ORAL | Status: DC | PRN

## 2012-10-15 MED ORDER — LINEZOLID 600 MG PO TABS: 600.0000 mg | ORAL_TABLET | Freq: Two times a day (BID) | ORAL | Status: DC

## 2012-10-15 MED ORDER — FLUTICASONE PROPIONATE 50 MCG/ACT NA SUSP: 2.0000 | Freq: Every day | NASAL | Status: DC

## 2012-10-15 MED ORDER — ASPIRIN 81 MG PO CHEW: 81.0000 mg | CHEWABLE_TABLET | Freq: Every day | ORAL | Status: DC

## 2012-10-15 MED ORDER — NITROGLYCERIN 0.4 MG SL SUBL: 0.4000 mg | SUBLINGUAL_TABLET | SUBLINGUAL | Status: DC | PRN

## 2012-10-15 MED ORDER — FESOTERODINE FUMARATE ER 8 MG PO TB24: 8.0000 mg | ORAL_TABLET | Freq: Every day | ORAL | Status: DC

## 2012-10-15 MED ORDER — INSULIN ASPART 100 UNIT/ML ~~LOC~~ SOLN: 3.0000 [IU] | Freq: Three times a day (TID) | SUBCUTANEOUS | Status: DC

## 2012-10-15 MED ORDER — MOMETASONE FURO-FORMOTEROL FUM 100-5 MCG/ACT IN AERO: 2.0000 | INHALATION_SPRAY | Freq: Two times a day (BID) | RESPIRATORY_TRACT | Status: DC

## 2012-10-15 MED ORDER — HYDROCODONE-ACETAMINOPHEN 5-325 MG PO TABS: 1.0000 | ORAL_TABLET | Freq: Four times a day (QID) | ORAL | Status: DC | PRN

## 2012-10-15 MED ORDER — POLYETHYLENE GLYCOL 3350 17 G PO PACK: 17.0000 g | PACK | Freq: Every day | ORAL | Status: DC

## 2012-10-15 MED ORDER — METOCLOPRAMIDE HCL 10 MG PO TABS: 10.0000 mg | ORAL_TABLET | Freq: Three times a day (TID) | ORAL | Status: DC

## 2012-10-15 MED ORDER — ALPRAZOLAM 0.5 MG PO TABS: 0.5000 mg | ORAL_TABLET | Freq: Three times a day (TID) | ORAL | Status: DC | PRN

## 2012-10-15 MED ORDER — ALBUTEROL SULFATE HFA 108 (90 BASE) MCG/ACT IN AERS: 2.0000 | INHALATION_SPRAY | Freq: Four times a day (QID) | RESPIRATORY_TRACT | Status: DC | PRN

## 2012-10-15 MED ORDER — SODIUM CHLORIDE 0.9 % IJ SOLN: 3.0000 mL | Freq: Two times a day (BID) | INTRAMUSCULAR | Status: DC

## 2012-10-15 MED ORDER — PREDNISONE 20 MG PO TABS: 20.0000 mg | ORAL_TABLET | Freq: Every day | ORAL | Status: DC

## 2012-10-15 MED ORDER — SUCRALFATE 1 GM/10ML PO SUSP: 1.0000 g | Freq: Three times a day (TID) | ORAL | Status: DC

## 2012-10-15 MED ORDER — CYANOCOBALAMIN 1000 MCG PO TABS: 1000.0000 ug | ORAL_TABLET | Freq: Every day | ORAL | Status: DC

## 2012-10-15 MED ORDER — INSULIN ASPART 100 UNIT/ML ~~LOC~~ SOLN: 0.0000 [IU] | Freq: Three times a day (TID) | SUBCUTANEOUS | Status: DC

## 2012-10-15 MED ORDER — HEPARIN SOD (PORK) LOCK FLUSH 100 UNIT/ML IV SOLN: 500.0000 [IU] | INTRAVENOUS | Status: DC | PRN

## 2012-10-15 MED ORDER — ATORVASTATIN CALCIUM 80 MG PO TABS: 80.0000 mg | ORAL_TABLET | Freq: Every day | ORAL | Status: DC

## 2012-10-15 MED ORDER — ALBUTEROL SULFATE (5 MG/ML) 0.5% IN NEBU: 2.5000 mg | INHALATION_SOLUTION | RESPIRATORY_TRACT | Status: DC

## 2012-10-15 MED ORDER — INSULIN DETEMIR 100 UNIT/ML ~~LOC~~ SOLN: 55.0000 [IU] | Freq: Every day | SUBCUTANEOUS | Status: DC

## 2012-10-15 MED ORDER — CALCITRIOL 0.25 MCG PO CAPS: 0.2500 ug | ORAL_CAPSULE | Freq: Every day | ORAL | Status: DC

## 2012-10-15 MED ORDER — DILTIAZEM HCL 120 MG PO TABS: 120.0000 mg | ORAL_TABLET | Freq: Every day | ORAL | Status: DC

## 2012-10-15 MED ORDER — GABAPENTIN 100 MG PO CAPS: 100.0000 mg | ORAL_CAPSULE | Freq: Three times a day (TID) | ORAL | Status: DC

## 2012-10-15 MED ORDER — TOBRAMYCIN-DEXAMETHASONE 0.3-0.1 % OP SUSP: 2.0000 [drp] | OPHTHALMIC | Status: DC

## 2012-10-15 MED ORDER — POTASSIUM CHLORIDE CRYS ER 20 MEQ PO TBCR: 40.0000 meq | EXTENDED_RELEASE_TABLET | Freq: Three times a day (TID) | ORAL | Status: DC

## 2012-10-15 MED ORDER — DIGOXIN 125 MCG PO TABS: 0.1250 mg | ORAL_TABLET | Freq: Every day | ORAL | Status: DC

## 2012-10-15 MED ORDER — HEPARIN SODIUM (PORCINE) 5000 UNIT/ML IJ SOLN: 5000.0000 [IU] | Freq: Three times a day (TID) | INTRAMUSCULAR | Status: DC

## 2012-10-15 MED ORDER — FUROSEMIDE 10 MG/ML IJ SOLN: 80.0000 mg | Freq: Two times a day (BID) | INTRAMUSCULAR | Status: DC

## 2012-10-15 MED ORDER — IPRATROPIUM BROMIDE 0.02 % IN SOLN: 500.0000 ug | Freq: Four times a day (QID) | RESPIRATORY_TRACT | Status: DC

## 2012-10-15 MED ORDER — SODIUM CHLORIDE 0.45 % IV SOLN: 50.0000 mL | INTRAVENOUS | Status: DC

## 2012-10-15 MED ORDER — PANTOPRAZOLE SODIUM 40 MG PO TBEC: 40.0000 mg | DELAYED_RELEASE_TABLET | Freq: Two times a day (BID) | ORAL | Status: DC

## 2012-10-15 MED ORDER — INSULIN ASPART 100 UNIT/ML ~~LOC~~ SOLN: 0.0000 [IU] | Freq: Every day | SUBCUTANEOUS | Status: DC

## 2012-10-15 MED ORDER — FLUCONAZOLE 100 MG PO TABS: 100.0000 mg | ORAL_TABLET | Freq: Every day | ORAL | Status: DC

## 2012-10-15 MED ORDER — LEVOTHYROXINE SODIUM 200 MCG PO TABS: 200.0000 ug | ORAL_TABLET | Freq: Every day | ORAL | Status: DC

## 2012-10-15 NOTE — Progress Notes (Signed)
[facility] here to pick patient up.  No acute distress noted.  Patient ready for transfer to [facility].

## 2012-10-15 NOTE — Progress Notes (Signed)
Report given to Sam at [facility] Patient ready for transfer to [facility] in [place].

## 2012-10-15 NOTE — Progress Notes (Signed)
Report given to Des at [facility].  Patient in route to [facility].

## 2012-10-15 NOTE — Discharge Summary (Signed)
Physician Discharge Summary  Patient ID: Ann [name] MRN: [id number] DOB/AGE: Aug 23, 1945 67 y.o. Primary Care Physician:Maurisa Tesmer L, MD Admit date: 10/06/2012 Discharge date: 10/15/2012    Discharge Diagnoses:   Principal Problem:   Cellulitis of arm, right Active Problems:   HYPOTHYROIDISM   Edema   Diabetes mellitus type 2 with complications   Chronic diastolic heart failure   Adrenal insufficiency   ARF (acute renal failure)   Hypokalemia   Morbid obesity   Chronic kidney disease, stage III (moderate)   Atrial fibrillation   UTI (urinary tract infection)   Acute on chronic diastolic heart failure   Acute renal failure (ARF)   VRE (vancomycin resistant enterococcus) culture positive   Chest pain   Nonsustained ventricular tachycardia     Medication List    ASK your doctor about these medications       albuterol 108 (90 BASE) MCG/ACT inhaler  Commonly known as:  PROVENTIL HFA;VENTOLIN HFA  Inhale 2 puffs into the lungs every 6 (six) hours as needed for wheezing.     ALPRAZolam 0.5 MG tablet  Commonly known as:  XANAX  Take 0.5 mg by mouth 3 (three) times daily as needed for sleep.     aspirin 81 MG chewable tablet  Chew 81 mg by mouth daily.     calcitRIOL 0.25 MCG capsule  Commonly known as:  ROCALTROL  Take 0.25 mcg by mouth daily.     digoxin 0.125 MG tablet  Commonly known as:  LANOXIN  Take 1 tablet (0.125 mg total) by mouth daily.     diltiazem 120 MG tablet  Commonly known as:  CARDIZEM  Take 1 tablet (120 mg total) by mouth daily.     fluticasone 50 MCG/ACT nasal spray  Commonly known as:  FLONASE  Place 2 sprays into the nose daily.     Fluticasone-Salmeterol 250-50 MCG/DOSE Aepb  Commonly known as:  ADVAIR DISKUS  Inhale 1 puff into the lungs 2 (two) times daily.     gabapentin 100 MG capsule  Commonly known as:  NEURONTIN  Take 100 mg by mouth 3 (three) times daily.     HYDROcodone-acetaminophen 5-325 MG per tablet  Commonly  known as:  NORCO  Take 1 tablet by mouth every 6 (six) hours as needed for pain.     insulin aspart 100 UNIT/ML injection  Commonly known as:  novoLOG  Inject 3-15 Units into the skin 3 (three) times daily before meals. 121-150=3 units, 151-200=4 units, 201-250=7 units, 251-300=9 units, 301-350=12 units, 351-400=15 units.     insulin detemir 100 UNIT/ML injection  Commonly known as:  LEVEMIR  Inject 50 Units into the skin at bedtime.     ipratropium 0.02 % nebulizer solution  Commonly known as:  ATROVENT  Take 2.5 mLs (500 mcg total) by nebulization 4 (four) times daily.     levalbuterol 0.63 MG/3ML nebulizer solution  Commonly known as:  XOPENEX  Take 3 mLs (0.63 mg total) by nebulization every 4 (four) hours as needed for wheezing.     levothyroxine 200 MCG tablet  Commonly known as:  SYNTHROID, LEVOTHROID  Take 200 mcg by mouth daily before breakfast.     metoCLOPramide 10 MG tablet  Commonly known as:  REGLAN  Take 1 tablet (10 mg total) by mouth 4 (four) times daily -  before meals and at bedtime.     ondansetron 4 MG tablet  Commonly known as:  ZOFRAN  Take 1 tablet (4 mg total) by mouth  every 6 (six) hours as needed for nausea.     pantoprazole 40 MG tablet  Commonly known as:  PROTONIX  Take 1 tablet (40 mg total) by mouth 2 (two) times daily before a meal.     polyethylene glycol packet  Commonly known as:  MIRALAX / GLYCOLAX  Take 17 g by mouth daily.     predniSONE 5 MG tablet  Commonly known as:  DELTASONE  Take 2 tablets (10 mg total) by mouth daily.     rosuvastatin 40 MG tablet  Commonly known as:  CRESTOR  Take 1 tablet (40 mg total) by mouth daily.     sertraline 50 MG tablet  Commonly known as:  ZOLOFT  Take 1 tablet (50 mg total) by mouth daily.     tolterodine 4 MG 24 hr capsule  Commonly known as:  DETROL LA  Take 1 capsule (4 mg total) by mouth daily.     torsemide 20 MG tablet  Commonly known as:  DEMADEX  Take 1 tablet (20 mg total) by  mouth 2 (two) times daily.     vitamin B-12 1000 MCG tablet  Commonly known as:  CYANOCOBALAMIN  Take 1,000 mcg by mouth daily.        Discharged Condition: Improved    Consults: Cardiology/nephrology/general surgery  Significant Diagnostic Studies: US Renal  10/07/2012   *RADIOLOGY REPORT*  Clinical Data: Renal failure.  RENAL/URINARY TRACT ULTRASOUND COMPLETE  Comparison:  Renal ultrasound dated 06/14/2012  Findings:  Right Kidney:  11.9 cm in length.  Normal in appearance.  Left Kidney:  11.8 cm in length.  Normal in appearance.  Bladder:  The bladder is empty with a Foley catheter in place.  IMPRESSION: Normal bilateral renal ultrasound.   Original Report Authenticated By: Francene Boyers, M.D.   US Venous Img Upper Uni Left  10/06/2012   *RADIOLOGY REPORT*  Clinical Data:  Left upper extremity swelling and erythema.  LEFT UPPER EXTREMITY VENOUS DUPLEX ULTRASOUND  Technique:  Gray-scale sonography with graded compression, as well as color Doppler and duplex ultrasound were performed to evaluate the upper extremity deep venous system from the level of the subclavian vein and including the jugular, axillary, basilic and upper cephalic vein.  Spectral Doppler was utilized to evaluate flow at rest and with distal augmentation maneuvers.  Comparison:  None.  Findings:  Normal compressibility of the upper extremity deep veins is demonstrated.  No venous filling defects visualized on grayscale or color Doppler US.  Normal direction of flow is seen throughout the deep veins.  Spectral Doppler waveforms show normal morphology at rest and with distal augmentation.  No evidence of superficial thrombophlebitis or visualized abnormal fluid collections.  IMPRESSION: No evidence of left upper extremity deep venous thrombosis.   Original Report Authenticated By: Irish Lack, M.D.   US Venous Img Upper Uni Right  10/06/2012   *RADIOLOGY REPORT*  Clinical Data:  Right arm swelling.  RIGHT UPPER EXTREMITY  VENOUS DUPLEX ULTRASOUND  Technique:  Gray-scale sonography with graded compression, as well as color Doppler and duplex ultrasound were performed to evaluate the upper extremity deep venous system from the level of the subclavian vein and including the jugular, axillary, basilic and upper cephalic vein.  Spectral Doppler was utilized to evaluate flow at rest and with distal augmentation maneuvers.  Comparison:  Left arm ultrasound from earlier today.  Findings:  Normal compressibility of the upper extremity deep veins is demonstrated.  No venous filling defects visualized on grayscale or color  Doppler US.  Normal direction of flow is seen throughout the deep veins.  Spectral Doppler waveforms show normal morphology at rest and with distal augmentation.  IMPRESSION: No evidence of upper extremity deep venous thrombosis.   Original Report Authenticated By: Davonna Belling, M.D.   Dg Chest Port 1 View  10/14/2012   *RADIOLOGY REPORT*  Clinical Data: Port-A-Cath placement.  PORTABLE CHEST - 1 VIEW  Comparison: 10/06/2012  Findings: Tip of the power port is in the superior vena cava at the level of the azygos vein. No pneumothorax.  Increased atelectasis in the left upper lobe with subsequent elevation of the left hemidiaphragm.  Increased atelectasis at the right lung base.  Pulmonary vascularity is normal.  IMPRESSION:  1.  Port-A-Cath tip in superior vena cava as described. 2.  Increased atelectasis bilaterally.   Original Report Authenticated By: Francene Boyers, M.D.   Dg Chest Portable 1 View  10/06/2012   *RADIOLOGY REPORT*  Clinical Data: Shortness of breath.  COPD.  PORTABLE CHEST - 1 VIEW  Comparison:  Portable chest 09/14/2012 and 08/10/2012.  Findings: Patchy airspace disease is seen in the left midlung zone. There is also some atelectasis in the right base.  Cardiomegaly is noted.  IMPRESSION: Patchy airspace disease in the left midlung is worrisome for pneumonia.  Right basilar subsegmental atelectasis  also noted.   Original Report Authenticated By: Holley Dexter, M.D.   Dg C-arm 1-60 Min-no Report  10/14/2012   CLINICAL DATA: port a cath placement   C-ARM 1-60 MINUTES  Fluoroscopy was utilized by the requesting physician.  No radiographic  interpretation.     Lab Results: Basic Metabolic Panel:  Recent Labs  40/98/11 0451 10/14/12 0506  NA 144 142  K 2.7* 3.1*  CL 104 100  CO2 30 31  GLUCOSE 90 61*  BUN 28* 27*  CREATININE 1.54* 1.34*  CALCIUM 8.2* 8.2*   Liver Function Tests: No results found for this basename: AST, ALT, ALKPHOS, BILITOT, PROT, ALBUMIN,  in the last 72 hours   CBC:  Recent Labs  10/14/12 0506  WBC 10.5  HGB 9.6*  HCT 29.6*  MCV 99.7  PLT 306    Recent Results (from the past 240 hour(s))  CULTURE, BLOOD (ROUTINE X 2)     Status: None   Collection Time    10/06/12  4:50 PM      Result Value Range Status   Specimen Description BLOOD RIGHT ANTECUBITAL   Final   Special Requests BOTTLES DRAWN AEROBIC AND ANAEROBIC 8CC   Final   Culture NO GROWTH 5 DAYS   Final   Report Status 10/11/2012 FINAL   Final  CULTURE, BLOOD (ROUTINE X 2)     Status: None   Collection Time    10/06/12  4:55 PM      Result Value Range Status   Specimen Description BLOOD LEFT ANTECUBITAL   Final   Special Requests     Final   Value: BOTTLES DRAWN AEROBIC AND ANAEROBIC AEB=15CC ANA=5CC   Culture NO GROWTH 5 DAYS   Final   Report Status 10/11/2012 FINAL   Final  URINE CULTURE     Status: None   Collection Time    10/06/12  6:41 PM      Result Value Range Status   Specimen Description URINE, CATHETERIZED   Final   Special Requests NONE   Final   Culture  Setup Time 10/06/2012 19:00   Final   Colony Count >=100,000 COLONIES/ML   Final  Culture     Final   Value: VANCOMYCIN RESISTANT ENTEROCOCCUS ISOLATED     Note: CRITICAL RESULT CALLED TO, READ BACK BY AND VERIFIED WITH: MELANE SIMPSON @ 2031 ON 10/09/2012   Report Status 10/09/2012 FINAL   Final   Organism  ID, Bacteria VANCOMYCIN RESISTANT ENTEROCOCCUS ISOLATED   Final  CLOSTRIDIUM DIFFICILE BY PCR     Status: Abnormal   Collection Time    10/11/12  2:30 PM      Result Value Range Status   C difficile by pcr POSITIVE (*) NEGATIVE Final   Comment: CRITICAL RESULT CALLED TO, READ BACK BY AND VERIFIED WITH:     TOLER M. AT 1617 ON [id number] BY THOMPSON S.     Hospital Course: She came to the hospital because of fever and inflammation in her right arm. She was felt to have cellulitis. She has been in and out of the hospital on multiple occasions so it is felt likely that this is related to MRSA. She was started on IV antibiotics. She was also found to have acute renal failure with some element of chronic renal failure also. This was felt to be related to ATN. She was treated for this with fluids et Karie Soda. She had anasarca which is an ongoing problem from diastolic heart failure. She was somewhat hypotensive and with her previous history of adrenal insufficiency she was started on IV steroids and that helped. Her urine culture grew vancomycin-resistant enterococcus so she's been treated with Zyvox she does not want to go to a nursing home for rehabilitation although that is strongly recommended. She has agreed to go to a long-term acute care hospital. She will need to continue on IV Lasix. Her IV fluids are running at 50. Her creatinine has come down from over 4 to 1.34. Her hemoglobin level was 9.6. She will need Zyvox for 7 more days. Her weight on the day of discharge is 284 and our goal weight is about 270 pounds. She will need physical therapy occupational therapy and speech therapy as needed. She will be on a no added salt carbohydrate modified diet. She has been requiring oxygen approximately 2 L per minute. She had Port-A-Cath placed during this hospitalization  Discharge Exam: Blood pressure 141/64, pulse 49, temperature 97.9 F (36.6 C), temperature source Oral, resp. rate 14, height 5\' 4"  (1.626 m),  weight 129.3 kg (285 lb 0.9 oz), SpO2 99.00%. She is awake and alert she is morbidly obese. She still has edema of both legs. Her chest is clear with some rhonchi. Her heart is regular with PACs  Disposition: To long-term acute care hospital for continued treatment of her anasarca and for rehabilitation with strengthening etc. As noted she will need Zyvox for 7 more days and she will also need Flagyl for 7 more days.       Future Appointments Provider Department Dept Phone   10/29/2012 10:30 AM [place], MD [facility] [phone]        Signed: Fredirick Maudlin Pager [phone]  10/15/2012, 8:10 AM

## 2012-10-15 NOTE — Progress Notes (Signed)
Subjective: Interval History: has no complaint of difficulty in breathing. She denies also any nausea vomiting..  Objective: Vital signs in last 24 hours: Temp:  [97.1 F (36.2 C)-98.1 F (36.7 C)] 97.5 F (36.4 C) (05/22 0400) Pulse Rate:  [49-97] 49 (05/22 0600) Resp:  [10-41] 14 (05/22 0600) BP: (125-193)/(64-147) 141/64 mmHg (05/22 0600) SpO2:  [90 %-100 %] 99 % (05/22 0600) Weight:  [129.3 kg (285 lb 0.9 oz)-130.182 kg (287 lb)] 129.3 kg (285 lb 0.9 oz) (05/22 0500) Weight change: -0.118 kg (-4.2 oz)  Intake/Output from previous day: 05/21 0701 - 05/22 0700 In: 3010 [P.O.:1260; I.V.:1750] Out: 5565 [Urine:5550; Blood:15] Intake/Output this shift:    General appearance: alert, cooperative and no distress Resp: diminished breath sounds anterior - bilateral Cardio: regular rate and rhythm, S1, S2 normal, no murmur, click, rub or gallop GI: soft, non-tender; bowel sounds normal; no masses,  no organomegaly Extremities: edema 2+ edema  Lab Results:  Recent Labs  10/14/12 0506  WBC 10.5  HGB 9.6*  HCT 29.6*  PLT 306   BMET:  Recent Labs  10/13/12 0451 10/14/12 0506  NA 144 142  K 2.7* 3.1*  CL 104 100  CO2 30 31  GLUCOSE 90 61*  BUN 28* 27*  CREATININE 1.54* 1.34*  CALCIUM 8.2* 8.2*   No results found for this basename: PTH,  in the last 72 hours Iron Studies: No results found for this basename: IRON, TIBC, TRANSFERRIN, FERRITIN,  in the last 72 hours  Studies/Results: Dg Chest Port 1 View  10/14/2012   *RADIOLOGY REPORT*  Clinical Data: Port-A-Cath placement.  PORTABLE CHEST - 1 VIEW  Comparison: 10/06/2012  Findings: Tip of the power port is in the superior vena cava at the level of the azygos vein. No pneumothorax.  Increased atelectasis in the left upper lobe with subsequent elevation of the left hemidiaphragm.  Increased atelectasis at the right lung base.  Pulmonary vascularity is normal.  IMPRESSION:  1.  Port-A-Cath tip in superior vena cava as  described. 2.  Increased atelectasis bilaterally.   Original Report Authenticated By: Francene Boyers, M.D.   Dg C-arm 1-60 Min-no Report  10/14/2012   CLINICAL DATA: port a cath placement   C-ARM 1-60 MINUTES  Fluoroscopy was utilized by the requesting physician.  No radiographic  interpretation.     I have reviewed the patient's current medications.  Assessment/Plan: Problem #1 acute kidney injury her BUN is 27 creatinine 1.34 renal function progressively improving. Problem #2 anasarca she is on Lasix with good urine output. Patient is still with significant edema. Problem #3 hypokalemia she is on potassium supplement potassium is 3.1 has improved. Patient with underlying stage III chronic renal failure. Problem #4 adrenal insufficiency Problem #5 VRE/ UTI  Problem #6 tachycardia heart is seems to be controlled. Problem #7 history of hypothyroidism. Plan: Continue his IV Lasix Possibly we'll change Lasix into oral diuretics tomorrow. We'll check her basic metabolic panel in the morning.   LOS: 9 days   Nadirah Socorro S 10/15/2012,7:14 AM

## 2012-10-15 NOTE — Progress Notes (Signed)
Subjective: She says she feels better. She has no new complaints. Her Port-A-Cath was placed yesterday without any trouble. She is set for transfer to long-term acute care hospital.  Objective: Vital signs in last 24 hours: Temp:  [97.1 F (36.2 C)-98.1 F (36.7 C)] 97.9 F (36.6 C) (05/22 0749) Pulse Rate:  [49-97] 49 (05/22 0600) Resp:  [10-41] 14 (05/22 0600) BP: (125-193)/(64-147) 141/64 mmHg (05/22 0600) SpO2:  [90 %-100 %] 99 % (05/22 0600) Weight:  [129.3 kg (285 lb 0.9 oz)-130.182 kg (287 lb)] 129.3 kg (285 lb 0.9 oz) (05/22 0500) Weight change: -0.118 kg (-4.2 oz) Last BM Date: 10/14/12  Intake/Output from previous day: 05/21 0701 - 05/22 0700 In: 3010 [P.O.:1260; I.V.:1750] Out: 5565 [Urine:5550; Blood:15]  PHYSICAL EXAM General appearance: alert, cooperative, mild distress and morbidly obese Resp: rhonchi bilaterally Cardio: regular rate and rhythm, S1, S2 normal, no murmur, click, rub or gallop GI: soft, non-tender; bowel sounds normal; no masses,  no organomegaly Extremities: venous stasis dermatitis noted and 2+ edema  Lab Results:    Basic Metabolic Panel:  Recent Labs  78/29/56 0451 10/14/12 0506  NA 144 142  K 2.7* 3.1*  CL 104 100  CO2 30 31  GLUCOSE 90 61*  BUN 28* 27*  CREATININE 1.54* 1.34*  CALCIUM 8.2* 8.2*   Liver Function Tests: No results found for this basename: AST, ALT, ALKPHOS, BILITOT, PROT, ALBUMIN,  in the last 72 hours No results found for this basename: LIPASE, AMYLASE,  in the last 72 hours No results found for this basename: AMMONIA,  in the last 72 hours CBC:  Recent Labs  10/14/12 0506  WBC 10.5  HGB 9.6*  HCT 29.6*  MCV 99.7  PLT 306   Cardiac Enzymes: No results found for this basename: CKTOTAL, CKMB, CKMBINDEX, TROPONINI,  in the last 72 hours BNP:  Recent Labs  10/12/12 1016  PROBNP 14169.0*   D-Dimer: No results found for this basename: DDIMER,  in the last 72 hours CBG:  Recent Labs   10/14/12 0729 10/14/12 0758 10/14/12 1250 10/14/12 1632 10/14/12 2111 10/15/12 0748  GLUCAP 61* 104* 73 119* 143* 88   Hemoglobin A1C: No results found for this basename: HGBA1C,  in the last 72 hours Fasting Lipid Panel: No results found for this basename: CHOL, HDL, LDLCALC, TRIG, CHOLHDL, LDLDIRECT,  in the last 72 hours Thyroid Function Tests: No results found for this basename: TSH, T4TOTAL, FREET4, T3FREE, THYROIDAB,  in the last 72 hours Anemia Panel: No results found for this basename: VITAMINB12, FOLATE, FERRITIN, TIBC, IRON, RETICCTPCT,  in the last 72 hours Coagulation: No results found for this basename: LABPROT, INR,  in the last 72 hours Urine Drug Screen: Drugs of Abuse  No results found for this basename: labopia, cocainscrnur, labbenz, amphetmu, thcu, labbarb    Alcohol Level: No results found for this basename: ETH,  in the last 72 hours Urinalysis: No results found for this basename: COLORURINE, APPERANCEUR, LABSPEC, PHURINE, GLUCOSEU, HGBUR, BILIRUBINUR, KETONESUR, PROTEINUR, UROBILINOGEN, NITRITE, LEUKOCYTESUR,  in the last 72 hours Misc. Labs:  ABGS No results found for this basename: PHART, PCO2, PO2ART, TCO2, HCO3,  in the last 72 hours CULTURES Recent Results (from the past 240 hour(s))  CULTURE, BLOOD (ROUTINE X 2)     Status: None   Collection Time    10/06/12  4:50 PM      Result Value Range Status   Specimen Description BLOOD RIGHT ANTECUBITAL   Final   Special Requests BOTTLES DRAWN  AEROBIC AND ANAEROBIC 8CC   Final   Culture NO GROWTH 5 DAYS   Final   Report Status 10/11/2012 FINAL   Final  CULTURE, BLOOD (ROUTINE X 2)     Status: None   Collection Time    10/06/12  4:55 PM      Result Value Range Status   Specimen Description BLOOD LEFT ANTECUBITAL   Final   Special Requests     Final   Value: BOTTLES DRAWN AEROBIC AND ANAEROBIC AEB=15CC ANA=5CC   Culture NO GROWTH 5 DAYS   Final   Report Status 10/11/2012 FINAL   Final  URINE CULTURE      Status: None   Collection Time    10/06/12  6:41 PM      Result Value Range Status   Specimen Description URINE, CATHETERIZED   Final   Special Requests NONE   Final   Culture  Setup Time 10/06/2012 19:00   Final   Colony Count >=100,000 COLONIES/ML   Final   Culture     Final   Value: VANCOMYCIN RESISTANT ENTEROCOCCUS ISOLATED     Note: CRITICAL RESULT CALLED TO, READ BACK BY AND VERIFIED WITH: MELANE SIMPSON @ 2031 ON 10/09/2012   Report Status 10/09/2012 FINAL   Final   Organism ID, Bacteria VANCOMYCIN RESISTANT ENTEROCOCCUS ISOLATED   Final  CLOSTRIDIUM DIFFICILE BY PCR     Status: Abnormal   Collection Time    10/11/12  2:30 PM      Result Value Range Status   C difficile by pcr POSITIVE (*) NEGATIVE Final   Comment: CRITICAL RESULT CALLED TO, READ BACK BY AND VERIFIED WITH:     TOLER M. AT 1617 ON [id number] BY THOMPSON S.   Studies/Results: Dg Chest Port 1 View  10/14/2012   *RADIOLOGY REPORT*  Clinical Data: Port-A-Cath placement.  PORTABLE CHEST - 1 VIEW  Comparison: 10/06/2012  Findings: Tip of the power port is in the superior vena cava at the level of the azygos vein. No pneumothorax.  Increased atelectasis in the left upper lobe with subsequent elevation of the left hemidiaphragm.  Increased atelectasis at the right lung base.  Pulmonary vascularity is normal.  IMPRESSION:  1.  Port-A-Cath tip in superior vena cava as described. 2.  Increased atelectasis bilaterally.   Original Report Authenticated By: Francene Boyers, M.D.   Dg C-arm 1-60 Min-no Report  10/14/2012   CLINICAL DATA: port a cath placement   C-ARM 1-60 MINUTES  Fluoroscopy was utilized by the requesting physician.  No radiographic  interpretation.     Medications:  Prior to Admission:  Prescriptions prior to admission  Medication Sig Dispense Refill  . albuterol (PROVENTIL HFA;VENTOLIN HFA) 108 (90 BASE) MCG/ACT inhaler Inhale 2 puffs into the lungs every 6 (six) hours as needed for wheezing.  1 Inhaler  2  .  ALPRAZolam (XANAX) 0.5 MG tablet Take 0.5 mg by mouth 3 (three) times daily as needed for sleep.       Marland Kitchen aspirin 81 MG chewable tablet Chew 81 mg by mouth daily.      . calcitRIOL (ROCALTROL) 0.25 MCG capsule Take 0.25 mcg by mouth daily.      . digoxin (LANOXIN) 0.125 MG tablet Take 1 tablet (0.125 mg total) by mouth daily.  30 tablet  12  . diltiazem (CARDIZEM) 120 MG tablet Take 1 tablet (120 mg total) by mouth daily.  30 tablet  12  . fluticasone (FLONASE) 50 MCG/ACT nasal spray Place 2 sprays into  the nose daily.      . Fluticasone-Salmeterol (ADVAIR DISKUS) 250-50 MCG/DOSE AEPB Inhale 1 puff into the lungs 2 (two) times daily.  60 each  12  . gabapentin (NEURONTIN) 100 MG capsule Take 100 mg by mouth 3 (three) times daily.      Marland Kitchen HYDROcodone-acetaminophen (NORCO) 5-325 MG per tablet Take 1 tablet by mouth every 6 (six) hours as needed for pain.  30 tablet  0  . insulin aspart (NOVOLOG) 100 UNIT/ML injection Inject 3-15 Units into the skin 3 (three) times daily before meals. 121-150=3 units, 151-200=4 units, 201-250=7 units, 251-300=9 units, 301-350=12 units, 351-400=15 units.      . insulin detemir (LEVEMIR) 100 UNIT/ML injection Inject 50 Units into the skin at bedtime.       Marland Kitchen ipratropium (ATROVENT) 0.02 % nebulizer solution Take 2.5 mLs (500 mcg total) by nebulization 4 (four) times daily.  75 mL  12  . levalbuterol (XOPENEX) 0.63 MG/3ML nebulizer solution Take 3 mLs (0.63 mg total) by nebulization every 4 (four) hours as needed for wheezing.  3 mL  12  . levothyroxine (SYNTHROID, LEVOTHROID) 200 MCG tablet Take 200 mcg by mouth daily before breakfast.      . ondansetron (ZOFRAN) 4 MG tablet Take 1 tablet (4 mg total) by mouth every 6 (six) hours as needed for nausea.  20 tablet  5  . pantoprazole (PROTONIX) 40 MG tablet Take 1 tablet (40 mg total) by mouth 2 (two) times daily before a meal.  60 tablet  12  . polyethylene glycol (MIRALAX / GLYCOLAX) packet Take 17 g by mouth daily.      .  predniSONE (DELTASONE) 5 MG tablet Take 2 tablets (10 mg total) by mouth daily.  60 tablet  12  . rosuvastatin (CRESTOR) 40 MG tablet Take 1 tablet (40 mg total) by mouth daily.  30 tablet  12  . sertraline (ZOLOFT) 50 MG tablet Take 1 tablet (50 mg total) by mouth daily.  30 tablet  12  . tolterodine (DETROL LA) 4 MG 24 hr capsule Take 1 capsule (4 mg total) by mouth daily.  30 capsule  12  . torsemide (DEMADEX) 20 MG tablet Take 1 tablet (20 mg total) by mouth 2 (two) times daily.  30 tablet  12  . vitamin B-12 (CYANOCOBALAMIN) 1000 MCG tablet Take 1,000 mcg by mouth daily.      . metoCLOPramide (REGLAN) 10 MG tablet Take 1 tablet (10 mg total) by mouth 4 (four) times daily -  before meals and at bedtime.  90 tablet  12   Scheduled: . albuterol  2.5 mg Nebulization Q4H  . aspirin  81 mg Oral Daily  . atorvastatin  80 mg Oral q1800  . calcitRIOL  0.25 mcg Oral Daily  . digoxin  0.125 mg Oral Daily  . diltiazem  120 mg Oral Daily  . fesoterodine  8 mg Oral Daily  . fluconazole  100 mg Oral Daily  . fluticasone  2 spray Each Nare Daily  . furosemide  80 mg Intravenous Q12H  . gabapentin  100 mg Oral TID  . heparin  5,000 Units Subcutaneous Q8H  . insulin aspart  0-20 Units Subcutaneous TID WC  . insulin aspart  0-5 Units Subcutaneous QHS  . insulin aspart  3 Units Subcutaneous TID WC  . insulin detemir  55 Units Subcutaneous QHS  . ipratropium  500 mcg Nebulization QID  . levothyroxine  200 mcg Oral QAC breakfast  . linezolid  600  mg Oral Q12H  . metoCLOPramide  10 mg Oral TID AC & HS  . metroNIDAZOLE  500 mg Oral Q8H  . mometasone-formoterol  2 puff Inhalation BID  . pantoprazole  40 mg Oral BID AC  . polyethylene glycol  17 g Oral Daily  . potassium chloride  40 mEq Oral TID  . predniSONE  20 mg Oral Q breakfast  . sodium chloride  3 mL Intravenous Q12H  . sucralfate  1 g Oral TID WC & HS  . tobramycin-dexamethasone  2 drop Both Eyes Q4H while awake  . vitamin B-12  1,000 mcg  Oral Daily   Continuous: . sodium chloride 0.45 % with kcl 50 mL/hr at 10/15/12 0650   ZOX:[id number], ALPRAZolam, alum & mag hydroxide-simeth, HYDROcodone-acetaminophen, loperamide, nitroGLYCERIN, ondansetron (ZOFRAN) IV, ondansetron, ondansetron  Assesment: She was admitted with cellulitis of her right arm. She has multiple other medical problems. She needs further treatment it is going to be transferred to a long-term acute care hospital. She will need more antibiotics she will need intravenous Lasix with close monitoring of her renal function Principal Problem:   Cellulitis of arm, right Active Problems:   HYPOTHYROIDISM   Edema   Diabetes mellitus type 2 with complications   Chronic diastolic heart failure   Adrenal insufficiency   ARF (acute renal failure)   Hypokalemia   Morbid obesity   Chronic kidney disease, stage III (moderate)   Atrial fibrillation   UTI (urinary tract infection)   Acute on chronic diastolic heart failure   Acute renal failure (ARF)   VRE (vancomycin resistant enterococcus) culture positive   Chest pain   Nonsustained ventricular tachycardia    Plan: Transfer to long-term acute care hospital today    LOS: 9 days   Nehemyah Foushee L 10/15/2012, 8:03 AM
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
## 2012-10-27 ENCOUNTER — Encounter: Payer: Self-pay | Admitting: Gastroenterology

## 2012-10-29 ENCOUNTER — Ambulatory Visit: Payer: Medicare Other | Admitting: Gastroenterology

## 2012-10-29 ENCOUNTER — Telehealth: Payer: Self-pay | Admitting: Gastroenterology

## 2012-10-29 NOTE — Telephone Encounter (Signed)
REVIEWED.  

## 2012-10-29 NOTE — Telephone Encounter (Signed)
Pt was a no show

## 2012-11-02 DIAGNOSIS — L0291 Cutaneous abscess, unspecified: Secondary | ICD-10-CM | POA: Diagnosis not present

## 2012-11-02 DIAGNOSIS — N390 Urinary tract infection, site not specified: Secondary | ICD-10-CM | POA: Diagnosis not present

## 2012-11-02 DIAGNOSIS — N170 Acute kidney failure with tubular necrosis: Secondary | ICD-10-CM | POA: Diagnosis not present

## 2012-11-02 DIAGNOSIS — K5289 Other specified noninfective gastroenteritis and colitis: Secondary | ICD-10-CM | POA: Diagnosis not present

## 2012-11-02 DIAGNOSIS — R1032 Left lower quadrant pain: Secondary | ICD-10-CM | POA: Diagnosis not present

## 2012-11-02 DIAGNOSIS — R262 Difficulty in walking, not elsewhere classified: Secondary | ICD-10-CM | POA: Diagnosis not present

## 2012-11-02 DIAGNOSIS — M6281 Muscle weakness (generalized): Secondary | ICD-10-CM | POA: Diagnosis not present

## 2012-11-02 DIAGNOSIS — B958 Unspecified staphylococcus as the cause of diseases classified elsewhere: Secondary | ICD-10-CM | POA: Diagnosis not present

## 2012-11-02 DIAGNOSIS — B952 Enterococcus as the cause of diseases classified elsewhere: Secondary | ICD-10-CM | POA: Diagnosis not present

## 2012-11-02 DIAGNOSIS — I509 Heart failure, unspecified: Secondary | ICD-10-CM | POA: Diagnosis not present

## 2012-11-02 DIAGNOSIS — A4902 Methicillin resistant Staphylococcus aureus infection, unspecified site: Secondary | ICD-10-CM | POA: Diagnosis not present

## 2012-11-02 DIAGNOSIS — L02419 Cutaneous abscess of limb, unspecified: Secondary | ICD-10-CM | POA: Diagnosis not present

## 2012-11-02 DIAGNOSIS — I4891 Unspecified atrial fibrillation: Secondary | ICD-10-CM | POA: Diagnosis not present

## 2012-11-02 DIAGNOSIS — I5031 Acute diastolic (congestive) heart failure: Secondary | ICD-10-CM | POA: Diagnosis not present

## 2012-11-02 DIAGNOSIS — K7689 Other specified diseases of liver: Secondary | ICD-10-CM | POA: Diagnosis not present

## 2012-11-02 DIAGNOSIS — A0472 Enterocolitis due to Clostridium difficile, not specified as recurrent: Secondary | ICD-10-CM | POA: Diagnosis not present

## 2012-11-04 DIAGNOSIS — A0472 Enterocolitis due to Clostridium difficile, not specified as recurrent: Secondary | ICD-10-CM | POA: Diagnosis not present

## 2012-11-04 DIAGNOSIS — I509 Heart failure, unspecified: Secondary | ICD-10-CM | POA: Diagnosis not present

## 2012-11-04 DIAGNOSIS — N390 Urinary tract infection, site not specified: Secondary | ICD-10-CM | POA: Diagnosis not present

## 2012-11-04 DIAGNOSIS — L0390 Cellulitis, unspecified: Secondary | ICD-10-CM | POA: Diagnosis not present

## 2012-12-02 DIAGNOSIS — I509 Heart failure, unspecified: Secondary | ICD-10-CM | POA: Diagnosis not present

## 2012-12-02 DIAGNOSIS — N390 Urinary tract infection, site not specified: Secondary | ICD-10-CM | POA: Diagnosis not present

## 2012-12-02 DIAGNOSIS — A0472 Enterocolitis due to Clostridium difficile, not specified as recurrent: Secondary | ICD-10-CM | POA: Diagnosis not present

## 2012-12-02 DIAGNOSIS — L0390 Cellulitis, unspecified: Secondary | ICD-10-CM | POA: Diagnosis not present

## 2012-12-09 NOTE — Progress Notes (Signed)
APR 2014 AST ELEVATED/AMA POS ON STEROIDS AND PHENOTHIAZINES  REVIEWED.

## 2012-12-27 DIAGNOSIS — I5032 Chronic diastolic (congestive) heart failure: Secondary | ICD-10-CM | POA: Diagnosis not present

## 2012-12-27 DIAGNOSIS — Z87440 Personal history of urinary (tract) infections: Secondary | ICD-10-CM | POA: Diagnosis not present

## 2012-12-27 DIAGNOSIS — Z794 Long term (current) use of insulin: Secondary | ICD-10-CM | POA: Diagnosis not present

## 2012-12-27 DIAGNOSIS — I509 Heart failure, unspecified: Secondary | ICD-10-CM | POA: Diagnosis not present

## 2012-12-27 DIAGNOSIS — I4891 Unspecified atrial fibrillation: Secondary | ICD-10-CM | POA: Diagnosis not present

## 2012-12-27 DIAGNOSIS — E119 Type 2 diabetes mellitus without complications: Secondary | ICD-10-CM | POA: Diagnosis not present

## 2012-12-28 DIAGNOSIS — E119 Type 2 diabetes mellitus without complications: Secondary | ICD-10-CM | POA: Diagnosis not present

## 2012-12-28 DIAGNOSIS — I5032 Chronic diastolic (congestive) heart failure: Secondary | ICD-10-CM | POA: Diagnosis not present

## 2012-12-28 DIAGNOSIS — I4891 Unspecified atrial fibrillation: Secondary | ICD-10-CM | POA: Diagnosis not present

## 2012-12-28 DIAGNOSIS — I509 Heart failure, unspecified: Secondary | ICD-10-CM | POA: Diagnosis not present

## 2012-12-30 DIAGNOSIS — I509 Heart failure, unspecified: Secondary | ICD-10-CM | POA: Diagnosis not present

## 2012-12-30 DIAGNOSIS — I4891 Unspecified atrial fibrillation: Secondary | ICD-10-CM | POA: Diagnosis not present

## 2012-12-30 DIAGNOSIS — E119 Type 2 diabetes mellitus without complications: Secondary | ICD-10-CM | POA: Diagnosis not present

## 2012-12-30 DIAGNOSIS — I5032 Chronic diastolic (congestive) heart failure: Secondary | ICD-10-CM | POA: Diagnosis not present

## 2012-12-31 DIAGNOSIS — E119 Type 2 diabetes mellitus without complications: Secondary | ICD-10-CM | POA: Diagnosis not present

## 2012-12-31 DIAGNOSIS — I5032 Chronic diastolic (congestive) heart failure: Secondary | ICD-10-CM | POA: Diagnosis not present

## 2012-12-31 DIAGNOSIS — I4891 Unspecified atrial fibrillation: Secondary | ICD-10-CM | POA: Diagnosis not present

## 2012-12-31 DIAGNOSIS — I509 Heart failure, unspecified: Secondary | ICD-10-CM | POA: Diagnosis not present

## 2013-01-04 DIAGNOSIS — I5032 Chronic diastolic (congestive) heart failure: Secondary | ICD-10-CM | POA: Diagnosis not present

## 2013-01-04 DIAGNOSIS — E119 Type 2 diabetes mellitus without complications: Secondary | ICD-10-CM | POA: Diagnosis not present

## 2013-01-04 DIAGNOSIS — I4891 Unspecified atrial fibrillation: Secondary | ICD-10-CM | POA: Diagnosis not present

## 2013-01-04 DIAGNOSIS — I509 Heart failure, unspecified: Secondary | ICD-10-CM | POA: Diagnosis not present

## 2013-01-05 DIAGNOSIS — E119 Type 2 diabetes mellitus without complications: Secondary | ICD-10-CM | POA: Diagnosis not present

## 2013-01-05 DIAGNOSIS — I4891 Unspecified atrial fibrillation: Secondary | ICD-10-CM | POA: Diagnosis not present

## 2013-01-05 DIAGNOSIS — I509 Heart failure, unspecified: Secondary | ICD-10-CM | POA: Diagnosis not present

## 2013-01-05 DIAGNOSIS — I5032 Chronic diastolic (congestive) heart failure: Secondary | ICD-10-CM | POA: Diagnosis not present

## 2013-01-06 DIAGNOSIS — I509 Heart failure, unspecified: Secondary | ICD-10-CM | POA: Diagnosis not present

## 2013-01-06 DIAGNOSIS — I5032 Chronic diastolic (congestive) heart failure: Secondary | ICD-10-CM | POA: Diagnosis not present

## 2013-01-06 DIAGNOSIS — E119 Type 2 diabetes mellitus without complications: Secondary | ICD-10-CM | POA: Diagnosis not present

## 2013-01-06 DIAGNOSIS — I4891 Unspecified atrial fibrillation: Secondary | ICD-10-CM | POA: Diagnosis not present

## 2013-01-07 DIAGNOSIS — I509 Heart failure, unspecified: Secondary | ICD-10-CM | POA: Diagnosis not present

## 2013-01-07 DIAGNOSIS — E109 Type 1 diabetes mellitus without complications: Secondary | ICD-10-CM | POA: Diagnosis not present

## 2013-01-07 DIAGNOSIS — E119 Type 2 diabetes mellitus without complications: Secondary | ICD-10-CM | POA: Diagnosis not present

## 2013-01-07 DIAGNOSIS — I5032 Chronic diastolic (congestive) heart failure: Secondary | ICD-10-CM | POA: Diagnosis not present

## 2013-01-07 DIAGNOSIS — I4891 Unspecified atrial fibrillation: Secondary | ICD-10-CM | POA: Diagnosis not present

## 2013-01-07 DIAGNOSIS — J449 Chronic obstructive pulmonary disease, unspecified: Secondary | ICD-10-CM | POA: Diagnosis not present

## 2013-01-08 DIAGNOSIS — I509 Heart failure, unspecified: Secondary | ICD-10-CM | POA: Diagnosis not present

## 2013-01-08 DIAGNOSIS — E119 Type 2 diabetes mellitus without complications: Secondary | ICD-10-CM | POA: Diagnosis not present

## 2013-01-08 DIAGNOSIS — I5032 Chronic diastolic (congestive) heart failure: Secondary | ICD-10-CM | POA: Diagnosis not present

## 2013-01-08 DIAGNOSIS — I4891 Unspecified atrial fibrillation: Secondary | ICD-10-CM | POA: Diagnosis not present

## 2013-01-09 DIAGNOSIS — I5032 Chronic diastolic (congestive) heart failure: Secondary | ICD-10-CM | POA: Diagnosis not present

## 2013-01-09 DIAGNOSIS — E119 Type 2 diabetes mellitus without complications: Secondary | ICD-10-CM | POA: Diagnosis not present

## 2013-01-09 DIAGNOSIS — I4891 Unspecified atrial fibrillation: Secondary | ICD-10-CM | POA: Diagnosis not present

## 2013-01-09 DIAGNOSIS — I509 Heart failure, unspecified: Secondary | ICD-10-CM | POA: Diagnosis not present

## 2013-01-10 DIAGNOSIS — I509 Heart failure, unspecified: Secondary | ICD-10-CM | POA: Diagnosis not present

## 2013-01-10 DIAGNOSIS — I5032 Chronic diastolic (congestive) heart failure: Secondary | ICD-10-CM | POA: Diagnosis not present

## 2013-01-10 DIAGNOSIS — E119 Type 2 diabetes mellitus without complications: Secondary | ICD-10-CM | POA: Diagnosis not present

## 2013-01-10 DIAGNOSIS — I4891 Unspecified atrial fibrillation: Secondary | ICD-10-CM | POA: Diagnosis not present

## 2013-01-11 DIAGNOSIS — I259 Chronic ischemic heart disease, unspecified: Secondary | ICD-10-CM | POA: Diagnosis not present

## 2013-01-11 DIAGNOSIS — I4891 Unspecified atrial fibrillation: Secondary | ICD-10-CM | POA: Diagnosis not present

## 2013-01-11 DIAGNOSIS — I509 Heart failure, unspecified: Secondary | ICD-10-CM | POA: Diagnosis not present

## 2013-01-11 DIAGNOSIS — Z23 Encounter for immunization: Secondary | ICD-10-CM | POA: Diagnosis not present

## 2013-01-11 DIAGNOSIS — E119 Type 2 diabetes mellitus without complications: Secondary | ICD-10-CM | POA: Diagnosis not present

## 2013-01-11 DIAGNOSIS — I10 Essential (primary) hypertension: Secondary | ICD-10-CM | POA: Diagnosis not present

## 2013-01-11 DIAGNOSIS — J449 Chronic obstructive pulmonary disease, unspecified: Secondary | ICD-10-CM | POA: Diagnosis not present

## 2013-01-11 DIAGNOSIS — I5032 Chronic diastolic (congestive) heart failure: Secondary | ICD-10-CM | POA: Diagnosis not present

## 2013-01-12 DIAGNOSIS — E119 Type 2 diabetes mellitus without complications: Secondary | ICD-10-CM | POA: Diagnosis not present

## 2013-01-12 DIAGNOSIS — I509 Heart failure, unspecified: Secondary | ICD-10-CM | POA: Diagnosis not present

## 2013-01-12 DIAGNOSIS — I4891 Unspecified atrial fibrillation: Secondary | ICD-10-CM | POA: Diagnosis not present

## 2013-01-12 DIAGNOSIS — I5032 Chronic diastolic (congestive) heart failure: Secondary | ICD-10-CM | POA: Diagnosis not present

## 2013-01-13 DIAGNOSIS — I4891 Unspecified atrial fibrillation: Secondary | ICD-10-CM | POA: Diagnosis not present

## 2013-01-13 DIAGNOSIS — I5032 Chronic diastolic (congestive) heart failure: Secondary | ICD-10-CM | POA: Diagnosis not present

## 2013-01-13 DIAGNOSIS — E119 Type 2 diabetes mellitus without complications: Secondary | ICD-10-CM | POA: Diagnosis not present

## 2013-01-13 DIAGNOSIS — I509 Heart failure, unspecified: Secondary | ICD-10-CM | POA: Diagnosis not present

## 2013-01-14 DIAGNOSIS — I509 Heart failure, unspecified: Secondary | ICD-10-CM | POA: Diagnosis not present

## 2013-01-14 DIAGNOSIS — I4891 Unspecified atrial fibrillation: Secondary | ICD-10-CM | POA: Diagnosis not present

## 2013-01-14 DIAGNOSIS — I5032 Chronic diastolic (congestive) heart failure: Secondary | ICD-10-CM | POA: Diagnosis not present

## 2013-01-14 DIAGNOSIS — E119 Type 2 diabetes mellitus without complications: Secondary | ICD-10-CM | POA: Diagnosis not present

## 2013-01-15 DIAGNOSIS — E119 Type 2 diabetes mellitus without complications: Secondary | ICD-10-CM | POA: Diagnosis not present

## 2013-01-15 DIAGNOSIS — I509 Heart failure, unspecified: Secondary | ICD-10-CM | POA: Diagnosis not present

## 2013-01-15 DIAGNOSIS — I4891 Unspecified atrial fibrillation: Secondary | ICD-10-CM | POA: Diagnosis not present

## 2013-01-15 DIAGNOSIS — I5032 Chronic diastolic (congestive) heart failure: Secondary | ICD-10-CM | POA: Diagnosis not present

## 2013-01-18 DIAGNOSIS — E119 Type 2 diabetes mellitus without complications: Secondary | ICD-10-CM | POA: Diagnosis not present

## 2013-01-18 DIAGNOSIS — I509 Heart failure, unspecified: Secondary | ICD-10-CM | POA: Diagnosis not present

## 2013-01-18 DIAGNOSIS — I4891 Unspecified atrial fibrillation: Secondary | ICD-10-CM | POA: Diagnosis not present

## 2013-01-18 DIAGNOSIS — I5032 Chronic diastolic (congestive) heart failure: Secondary | ICD-10-CM | POA: Diagnosis not present

## 2013-01-19 DIAGNOSIS — I4891 Unspecified atrial fibrillation: Secondary | ICD-10-CM | POA: Diagnosis not present

## 2013-01-19 DIAGNOSIS — I509 Heart failure, unspecified: Secondary | ICD-10-CM | POA: Diagnosis not present

## 2013-01-19 DIAGNOSIS — E119 Type 2 diabetes mellitus without complications: Secondary | ICD-10-CM | POA: Diagnosis not present

## 2013-01-19 DIAGNOSIS — I5032 Chronic diastolic (congestive) heart failure: Secondary | ICD-10-CM | POA: Diagnosis not present

## 2013-01-20 DIAGNOSIS — E119 Type 2 diabetes mellitus without complications: Secondary | ICD-10-CM | POA: Diagnosis not present

## 2013-01-20 DIAGNOSIS — I509 Heart failure, unspecified: Secondary | ICD-10-CM | POA: Diagnosis not present

## 2013-01-20 DIAGNOSIS — I4891 Unspecified atrial fibrillation: Secondary | ICD-10-CM | POA: Diagnosis not present

## 2013-01-20 DIAGNOSIS — I5032 Chronic diastolic (congestive) heart failure: Secondary | ICD-10-CM | POA: Diagnosis not present

## 2013-01-21 DIAGNOSIS — E119 Type 2 diabetes mellitus without complications: Secondary | ICD-10-CM | POA: Diagnosis not present

## 2013-01-21 DIAGNOSIS — I509 Heart failure, unspecified: Secondary | ICD-10-CM | POA: Diagnosis not present

## 2013-01-21 DIAGNOSIS — I4891 Unspecified atrial fibrillation: Secondary | ICD-10-CM | POA: Diagnosis not present

## 2013-01-21 DIAGNOSIS — I5032 Chronic diastolic (congestive) heart failure: Secondary | ICD-10-CM | POA: Diagnosis not present

## 2013-01-25 DIAGNOSIS — I509 Heart failure, unspecified: Secondary | ICD-10-CM | POA: Diagnosis not present

## 2013-01-25 DIAGNOSIS — I4891 Unspecified atrial fibrillation: Secondary | ICD-10-CM | POA: Diagnosis not present

## 2013-01-25 DIAGNOSIS — E119 Type 2 diabetes mellitus without complications: Secondary | ICD-10-CM | POA: Diagnosis not present

## 2013-01-25 DIAGNOSIS — I5032 Chronic diastolic (congestive) heart failure: Secondary | ICD-10-CM | POA: Diagnosis not present

## 2013-01-26 DIAGNOSIS — I509 Heart failure, unspecified: Secondary | ICD-10-CM | POA: Diagnosis not present

## 2013-01-26 DIAGNOSIS — I5032 Chronic diastolic (congestive) heart failure: Secondary | ICD-10-CM | POA: Diagnosis not present

## 2013-01-26 DIAGNOSIS — E119 Type 2 diabetes mellitus without complications: Secondary | ICD-10-CM | POA: Diagnosis not present

## 2013-01-26 DIAGNOSIS — I4891 Unspecified atrial fibrillation: Secondary | ICD-10-CM | POA: Diagnosis not present

## 2013-01-27 DIAGNOSIS — I509 Heart failure, unspecified: Secondary | ICD-10-CM | POA: Diagnosis not present

## 2013-01-27 DIAGNOSIS — I4891 Unspecified atrial fibrillation: Secondary | ICD-10-CM | POA: Diagnosis not present

## 2013-01-27 DIAGNOSIS — E119 Type 2 diabetes mellitus without complications: Secondary | ICD-10-CM | POA: Diagnosis not present

## 2013-01-27 DIAGNOSIS — I5032 Chronic diastolic (congestive) heart failure: Secondary | ICD-10-CM | POA: Diagnosis not present

## 2013-01-28 DIAGNOSIS — I5032 Chronic diastolic (congestive) heart failure: Secondary | ICD-10-CM | POA: Diagnosis not present

## 2013-01-28 DIAGNOSIS — E119 Type 2 diabetes mellitus without complications: Secondary | ICD-10-CM | POA: Diagnosis not present

## 2013-01-28 DIAGNOSIS — I4891 Unspecified atrial fibrillation: Secondary | ICD-10-CM | POA: Diagnosis not present

## 2013-01-28 DIAGNOSIS — I509 Heart failure, unspecified: Secondary | ICD-10-CM | POA: Diagnosis not present

## 2013-01-29 DIAGNOSIS — I509 Heart failure, unspecified: Secondary | ICD-10-CM | POA: Diagnosis not present

## 2013-01-29 DIAGNOSIS — E119 Type 2 diabetes mellitus without complications: Secondary | ICD-10-CM | POA: Diagnosis not present

## 2013-01-29 DIAGNOSIS — I4891 Unspecified atrial fibrillation: Secondary | ICD-10-CM | POA: Diagnosis not present

## 2013-01-29 DIAGNOSIS — I5032 Chronic diastolic (congestive) heart failure: Secondary | ICD-10-CM | POA: Diagnosis not present

## 2013-02-01 DIAGNOSIS — I4891 Unspecified atrial fibrillation: Secondary | ICD-10-CM | POA: Diagnosis not present

## 2013-02-01 DIAGNOSIS — I5032 Chronic diastolic (congestive) heart failure: Secondary | ICD-10-CM | POA: Diagnosis not present

## 2013-02-01 DIAGNOSIS — E119 Type 2 diabetes mellitus without complications: Secondary | ICD-10-CM | POA: Diagnosis not present

## 2013-02-01 DIAGNOSIS — I509 Heart failure, unspecified: Secondary | ICD-10-CM | POA: Diagnosis not present

## 2013-02-02 DIAGNOSIS — I5032 Chronic diastolic (congestive) heart failure: Secondary | ICD-10-CM | POA: Diagnosis not present

## 2013-02-02 DIAGNOSIS — I509 Heart failure, unspecified: Secondary | ICD-10-CM | POA: Diagnosis not present

## 2013-02-02 DIAGNOSIS — I4891 Unspecified atrial fibrillation: Secondary | ICD-10-CM | POA: Diagnosis not present

## 2013-02-02 DIAGNOSIS — E119 Type 2 diabetes mellitus without complications: Secondary | ICD-10-CM | POA: Diagnosis not present

## 2013-02-03 DIAGNOSIS — I4891 Unspecified atrial fibrillation: Secondary | ICD-10-CM | POA: Diagnosis not present

## 2013-02-03 DIAGNOSIS — I5032 Chronic diastolic (congestive) heart failure: Secondary | ICD-10-CM | POA: Diagnosis not present

## 2013-02-03 DIAGNOSIS — E119 Type 2 diabetes mellitus without complications: Secondary | ICD-10-CM | POA: Diagnosis not present

## 2013-02-03 DIAGNOSIS — I509 Heart failure, unspecified: Secondary | ICD-10-CM | POA: Diagnosis not present

## 2013-02-04 DIAGNOSIS — E119 Type 2 diabetes mellitus without complications: Secondary | ICD-10-CM | POA: Diagnosis not present

## 2013-02-04 DIAGNOSIS — I5032 Chronic diastolic (congestive) heart failure: Secondary | ICD-10-CM | POA: Diagnosis not present

## 2013-02-04 DIAGNOSIS — I4891 Unspecified atrial fibrillation: Secondary | ICD-10-CM | POA: Diagnosis not present

## 2013-02-04 DIAGNOSIS — I509 Heart failure, unspecified: Secondary | ICD-10-CM | POA: Diagnosis not present

## 2013-02-05 DIAGNOSIS — I5032 Chronic diastolic (congestive) heart failure: Secondary | ICD-10-CM | POA: Diagnosis not present

## 2013-02-05 DIAGNOSIS — I4891 Unspecified atrial fibrillation: Secondary | ICD-10-CM | POA: Diagnosis not present

## 2013-02-05 DIAGNOSIS — I509 Heart failure, unspecified: Secondary | ICD-10-CM | POA: Diagnosis not present

## 2013-02-05 DIAGNOSIS — E119 Type 2 diabetes mellitus without complications: Secondary | ICD-10-CM | POA: Diagnosis not present

## 2013-02-09 DIAGNOSIS — E119 Type 2 diabetes mellitus without complications: Secondary | ICD-10-CM | POA: Diagnosis not present

## 2013-02-09 DIAGNOSIS — I5032 Chronic diastolic (congestive) heart failure: Secondary | ICD-10-CM | POA: Diagnosis not present

## 2013-02-09 DIAGNOSIS — I259 Chronic ischemic heart disease, unspecified: Secondary | ICD-10-CM | POA: Diagnosis not present

## 2013-02-09 DIAGNOSIS — E785 Hyperlipidemia, unspecified: Secondary | ICD-10-CM | POA: Diagnosis not present

## 2013-02-09 DIAGNOSIS — J449 Chronic obstructive pulmonary disease, unspecified: Secondary | ICD-10-CM | POA: Diagnosis not present

## 2013-02-09 DIAGNOSIS — I509 Heart failure, unspecified: Secondary | ICD-10-CM | POA: Diagnosis not present

## 2013-02-09 DIAGNOSIS — I4891 Unspecified atrial fibrillation: Secondary | ICD-10-CM | POA: Diagnosis not present

## 2013-02-09 DIAGNOSIS — I10 Essential (primary) hypertension: Secondary | ICD-10-CM | POA: Diagnosis not present

## 2013-02-11 DIAGNOSIS — I509 Heart failure, unspecified: Secondary | ICD-10-CM | POA: Diagnosis not present

## 2013-02-11 DIAGNOSIS — E119 Type 2 diabetes mellitus without complications: Secondary | ICD-10-CM | POA: Diagnosis not present

## 2013-02-11 DIAGNOSIS — I5032 Chronic diastolic (congestive) heart failure: Secondary | ICD-10-CM | POA: Diagnosis not present

## 2013-02-11 DIAGNOSIS — I4891 Unspecified atrial fibrillation: Secondary | ICD-10-CM | POA: Diagnosis not present

## 2013-02-24 DIAGNOSIS — I509 Heart failure, unspecified: Secondary | ICD-10-CM | POA: Diagnosis not present

## 2013-02-24 DIAGNOSIS — E119 Type 2 diabetes mellitus without complications: Secondary | ICD-10-CM | POA: Diagnosis not present

## 2013-02-24 DIAGNOSIS — I4891 Unspecified atrial fibrillation: Secondary | ICD-10-CM | POA: Diagnosis not present

## 2013-02-24 DIAGNOSIS — I5032 Chronic diastolic (congestive) heart failure: Secondary | ICD-10-CM | POA: Diagnosis not present

## 2013-02-25 DIAGNOSIS — Z794 Long term (current) use of insulin: Secondary | ICD-10-CM | POA: Diagnosis not present

## 2013-02-25 DIAGNOSIS — E119 Type 2 diabetes mellitus without complications: Secondary | ICD-10-CM | POA: Diagnosis not present

## 2013-02-25 DIAGNOSIS — I4891 Unspecified atrial fibrillation: Secondary | ICD-10-CM | POA: Diagnosis not present

## 2013-02-25 DIAGNOSIS — Z87440 Personal history of urinary (tract) infections: Secondary | ICD-10-CM | POA: Diagnosis not present

## 2013-02-25 DIAGNOSIS — I509 Heart failure, unspecified: Secondary | ICD-10-CM | POA: Diagnosis not present

## 2013-02-25 DIAGNOSIS — I5032 Chronic diastolic (congestive) heart failure: Secondary | ICD-10-CM | POA: Diagnosis not present

## 2013-02-26 DIAGNOSIS — I5032 Chronic diastolic (congestive) heart failure: Secondary | ICD-10-CM | POA: Diagnosis not present

## 2013-02-26 DIAGNOSIS — I4891 Unspecified atrial fibrillation: Secondary | ICD-10-CM | POA: Diagnosis not present

## 2013-02-26 DIAGNOSIS — E119 Type 2 diabetes mellitus without complications: Secondary | ICD-10-CM | POA: Diagnosis not present

## 2013-02-26 DIAGNOSIS — I509 Heart failure, unspecified: Secondary | ICD-10-CM | POA: Diagnosis not present

## 2013-03-02 DIAGNOSIS — I509 Heart failure, unspecified: Secondary | ICD-10-CM | POA: Diagnosis not present

## 2013-03-02 DIAGNOSIS — E119 Type 2 diabetes mellitus without complications: Secondary | ICD-10-CM | POA: Diagnosis not present

## 2013-03-02 DIAGNOSIS — I5032 Chronic diastolic (congestive) heart failure: Secondary | ICD-10-CM | POA: Diagnosis not present

## 2013-03-02 DIAGNOSIS — I4891 Unspecified atrial fibrillation: Secondary | ICD-10-CM | POA: Diagnosis not present

## 2013-03-03 DIAGNOSIS — I4891 Unspecified atrial fibrillation: Secondary | ICD-10-CM | POA: Diagnosis not present

## 2013-03-03 DIAGNOSIS — I5032 Chronic diastolic (congestive) heart failure: Secondary | ICD-10-CM | POA: Diagnosis not present

## 2013-03-03 DIAGNOSIS — E119 Type 2 diabetes mellitus without complications: Secondary | ICD-10-CM | POA: Diagnosis not present

## 2013-03-03 DIAGNOSIS — I509 Heart failure, unspecified: Secondary | ICD-10-CM | POA: Diagnosis not present

## 2013-03-04 DIAGNOSIS — I5032 Chronic diastolic (congestive) heart failure: Secondary | ICD-10-CM | POA: Diagnosis not present

## 2013-03-04 DIAGNOSIS — I4891 Unspecified atrial fibrillation: Secondary | ICD-10-CM | POA: Diagnosis not present

## 2013-03-04 DIAGNOSIS — E119 Type 2 diabetes mellitus without complications: Secondary | ICD-10-CM | POA: Diagnosis not present

## 2013-03-04 DIAGNOSIS — I509 Heart failure, unspecified: Secondary | ICD-10-CM | POA: Diagnosis not present

## 2013-03-05 DIAGNOSIS — I509 Heart failure, unspecified: Secondary | ICD-10-CM | POA: Diagnosis not present

## 2013-03-05 DIAGNOSIS — I4891 Unspecified atrial fibrillation: Secondary | ICD-10-CM | POA: Diagnosis not present

## 2013-03-05 DIAGNOSIS — E119 Type 2 diabetes mellitus without complications: Secondary | ICD-10-CM | POA: Diagnosis not present

## 2013-03-05 DIAGNOSIS — I5032 Chronic diastolic (congestive) heart failure: Secondary | ICD-10-CM | POA: Diagnosis not present

## 2013-03-08 DIAGNOSIS — E119 Type 2 diabetes mellitus without complications: Secondary | ICD-10-CM | POA: Diagnosis not present

## 2013-03-08 DIAGNOSIS — I509 Heart failure, unspecified: Secondary | ICD-10-CM | POA: Diagnosis not present

## 2013-03-08 DIAGNOSIS — I5032 Chronic diastolic (congestive) heart failure: Secondary | ICD-10-CM | POA: Diagnosis not present

## 2013-03-08 DIAGNOSIS — I4891 Unspecified atrial fibrillation: Secondary | ICD-10-CM | POA: Diagnosis not present

## 2013-03-09 DIAGNOSIS — I5032 Chronic diastolic (congestive) heart failure: Secondary | ICD-10-CM | POA: Diagnosis not present

## 2013-03-09 DIAGNOSIS — H02409 Unspecified ptosis of unspecified eyelid: Secondary | ICD-10-CM | POA: Diagnosis not present

## 2013-03-09 DIAGNOSIS — I509 Heart failure, unspecified: Secondary | ICD-10-CM | POA: Diagnosis not present

## 2013-03-09 DIAGNOSIS — E119 Type 2 diabetes mellitus without complications: Secondary | ICD-10-CM | POA: Diagnosis not present

## 2013-03-09 DIAGNOSIS — I4891 Unspecified atrial fibrillation: Secondary | ICD-10-CM | POA: Diagnosis not present

## 2013-03-09 DIAGNOSIS — Z961 Presence of intraocular lens: Secondary | ICD-10-CM | POA: Diagnosis not present

## 2013-03-09 DIAGNOSIS — Z794 Long term (current) use of insulin: Secondary | ICD-10-CM | POA: Diagnosis not present

## 2013-03-10 DIAGNOSIS — I509 Heart failure, unspecified: Secondary | ICD-10-CM | POA: Diagnosis not present

## 2013-03-10 DIAGNOSIS — I4891 Unspecified atrial fibrillation: Secondary | ICD-10-CM | POA: Diagnosis not present

## 2013-03-10 DIAGNOSIS — I5032 Chronic diastolic (congestive) heart failure: Secondary | ICD-10-CM | POA: Diagnosis not present

## 2013-03-10 DIAGNOSIS — E119 Type 2 diabetes mellitus without complications: Secondary | ICD-10-CM | POA: Diagnosis not present

## 2013-03-11 DIAGNOSIS — I5032 Chronic diastolic (congestive) heart failure: Secondary | ICD-10-CM | POA: Diagnosis not present

## 2013-03-11 DIAGNOSIS — H02409 Unspecified ptosis of unspecified eyelid: Secondary | ICD-10-CM | POA: Diagnosis not present

## 2013-03-11 DIAGNOSIS — I4891 Unspecified atrial fibrillation: Secondary | ICD-10-CM | POA: Diagnosis not present

## 2013-03-11 DIAGNOSIS — I509 Heart failure, unspecified: Secondary | ICD-10-CM | POA: Diagnosis not present

## 2013-03-11 DIAGNOSIS — E119 Type 2 diabetes mellitus without complications: Secondary | ICD-10-CM | POA: Diagnosis not present

## 2013-03-15 ENCOUNTER — Inpatient Hospital Stay (HOSPITAL_COMMUNITY)
Admission: EM | Admit: 2013-03-15 | Discharge: 2013-03-22 | DRG: 872 | Disposition: A | Discharge status: Home Health | Payer: Medicare Other | Attending: Pulmonary Disease | Admitting: Pulmonary Disease

## 2013-03-15 ENCOUNTER — Emergency Department (HOSPITAL_COMMUNITY): Payer: Medicare Other

## 2013-03-15 ENCOUNTER — Encounter (HOSPITAL_COMMUNITY): Payer: Self-pay | Admitting: Emergency Medicine

## 2013-03-15 DIAGNOSIS — E876 Hypokalemia: Secondary | ICD-10-CM

## 2013-03-15 DIAGNOSIS — I482 Chronic atrial fibrillation, unspecified: Secondary | ICD-10-CM | POA: Diagnosis present

## 2013-03-15 DIAGNOSIS — I509 Heart failure, unspecified: Secondary | ICD-10-CM | POA: Diagnosis not present

## 2013-03-15 DIAGNOSIS — E785 Hyperlipidemia, unspecified: Secondary | ICD-10-CM | POA: Diagnosis present

## 2013-03-15 DIAGNOSIS — I5032 Chronic diastolic (congestive) heart failure: Secondary | ICD-10-CM | POA: Diagnosis present

## 2013-03-15 DIAGNOSIS — Z800 Family history of malignant neoplasm of digestive organs: Secondary | ICD-10-CM

## 2013-03-15 DIAGNOSIS — I129 Hypertensive chronic kidney disease with stage 1 through stage 4 chronic kidney disease, or unspecified chronic kidney disease: Secondary | ICD-10-CM | POA: Diagnosis present

## 2013-03-15 DIAGNOSIS — Z6837 Body mass index (BMI) 37.0-37.9, adult: Secondary | ICD-10-CM

## 2013-03-15 DIAGNOSIS — E2749 Other adrenocortical insufficiency: Secondary | ICD-10-CM | POA: Diagnosis not present

## 2013-03-15 DIAGNOSIS — E1149 Type 2 diabetes mellitus with other diabetic neurological complication: Secondary | ICD-10-CM | POA: Diagnosis present

## 2013-03-15 DIAGNOSIS — E1142 Type 2 diabetes mellitus with diabetic polyneuropathy: Secondary | ICD-10-CM | POA: Diagnosis not present

## 2013-03-15 DIAGNOSIS — Z7901 Long term (current) use of anticoagulants: Secondary | ICD-10-CM | POA: Diagnosis not present

## 2013-03-15 DIAGNOSIS — J4489 Other specified chronic obstructive pulmonary disease: Secondary | ICD-10-CM | POA: Diagnosis present

## 2013-03-15 DIAGNOSIS — G8929 Other chronic pain: Secondary | ICD-10-CM | POA: Diagnosis present

## 2013-03-15 DIAGNOSIS — E118 Type 2 diabetes mellitus with unspecified complications: Secondary | ICD-10-CM

## 2013-03-15 DIAGNOSIS — R748 Abnormal levels of other serum enzymes: Secondary | ICD-10-CM | POA: Diagnosis present

## 2013-03-15 DIAGNOSIS — I4891 Unspecified atrial fibrillation: Secondary | ICD-10-CM

## 2013-03-15 DIAGNOSIS — N189 Chronic kidney disease, unspecified: Secondary | ICD-10-CM

## 2013-03-15 DIAGNOSIS — Z452 Encounter for adjustment and management of vascular access device: Secondary | ICD-10-CM | POA: Diagnosis not present

## 2013-03-15 DIAGNOSIS — E2740 Unspecified adrenocortical insufficiency: Secondary | ICD-10-CM | POA: Diagnosis present

## 2013-03-15 DIAGNOSIS — Z8249 Family history of ischemic heart disease and other diseases of the circulatory system: Secondary | ICD-10-CM

## 2013-03-15 DIAGNOSIS — IMO0002 Reserved for concepts with insufficient information to code with codable children: Secondary | ICD-10-CM

## 2013-03-15 DIAGNOSIS — E860 Dehydration: Secondary | ICD-10-CM | POA: Diagnosis not present

## 2013-03-15 DIAGNOSIS — I2510 Atherosclerotic heart disease of native coronary artery without angina pectoris: Secondary | ICD-10-CM | POA: Diagnosis present

## 2013-03-15 DIAGNOSIS — A419 Sepsis, unspecified organism: Secondary | ICD-10-CM | POA: Diagnosis not present

## 2013-03-15 DIAGNOSIS — E538 Deficiency of other specified B group vitamins: Secondary | ICD-10-CM | POA: Diagnosis present

## 2013-03-15 DIAGNOSIS — I369 Nonrheumatic tricuspid valve disorder, unspecified: Secondary | ICD-10-CM | POA: Diagnosis not present

## 2013-03-15 DIAGNOSIS — R7989 Other specified abnormal findings of blood chemistry: Secondary | ICD-10-CM | POA: Diagnosis not present

## 2013-03-15 DIAGNOSIS — J449 Chronic obstructive pulmonary disease, unspecified: Secondary | ICD-10-CM | POA: Diagnosis present

## 2013-03-15 DIAGNOSIS — E119 Type 2 diabetes mellitus without complications: Secondary | ICD-10-CM | POA: Diagnosis not present

## 2013-03-15 DIAGNOSIS — N183 Chronic kidney disease, stage 3 unspecified: Secondary | ICD-10-CM | POA: Diagnosis present

## 2013-03-15 DIAGNOSIS — I472 Ventricular tachycardia: Secondary | ICD-10-CM

## 2013-03-15 DIAGNOSIS — F411 Generalized anxiety disorder: Secondary | ICD-10-CM | POA: Diagnosis present

## 2013-03-15 DIAGNOSIS — K219 Gastro-esophageal reflux disease without esophagitis: Secondary | ICD-10-CM | POA: Diagnosis present

## 2013-03-15 DIAGNOSIS — Z86718 Personal history of other venous thrombosis and embolism: Secondary | ICD-10-CM

## 2013-03-15 DIAGNOSIS — M129 Arthropathy, unspecified: Secondary | ICD-10-CM | POA: Diagnosis present

## 2013-03-15 DIAGNOSIS — Z794 Long term (current) use of insulin: Secondary | ICD-10-CM | POA: Diagnosis not present

## 2013-03-15 DIAGNOSIS — Z9861 Coronary angioplasty status: Secondary | ICD-10-CM

## 2013-03-15 DIAGNOSIS — R9431 Abnormal electrocardiogram [ECG] [EKG]: Secondary | ICD-10-CM | POA: Diagnosis not present

## 2013-03-15 DIAGNOSIS — N390 Urinary tract infection, site not specified: Secondary | ICD-10-CM | POA: Diagnosis present

## 2013-03-15 DIAGNOSIS — I10 Essential (primary) hypertension: Secondary | ICD-10-CM | POA: Diagnosis present

## 2013-03-15 DIAGNOSIS — I959 Hypotension, unspecified: Secondary | ICD-10-CM | POA: Diagnosis not present

## 2013-03-15 DIAGNOSIS — E039 Hypothyroidism, unspecified: Secondary | ICD-10-CM | POA: Diagnosis present

## 2013-03-15 DIAGNOSIS — B961 Klebsiella pneumoniae [K. pneumoniae] as the cause of diseases classified elsewhere: Secondary | ICD-10-CM | POA: Diagnosis not present

## 2013-03-15 DIAGNOSIS — Z87891 Personal history of nicotine dependence: Secondary | ICD-10-CM

## 2013-03-15 DIAGNOSIS — E1165 Type 2 diabetes mellitus with hyperglycemia: Secondary | ICD-10-CM | POA: Diagnosis present

## 2013-03-15 DIAGNOSIS — I9589 Other hypotension: Secondary | ICD-10-CM | POA: Diagnosis not present

## 2013-03-15 DIAGNOSIS — E782 Mixed hyperlipidemia: Secondary | ICD-10-CM | POA: Diagnosis present

## 2013-03-15 LAB — BLOOD GAS, ARTERIAL
O2 Content: 2 L/min
Patient temperature: 37
pCO2 arterial: 47.8 mmHg — ABNORMAL HIGH (ref 35.0–45.0)
pH, Arterial: 7.399 (ref 7.350–7.450)

## 2013-03-15 LAB — DIGOXIN LEVEL: Digoxin Level: 1.1 ng/mL (ref 0.8–2.0)

## 2013-03-15 LAB — CBC WITH DIFFERENTIAL/PLATELET
Basophils Absolute: 0 10*3/uL (ref 0.0–0.1)
Basophils Relative: 0 % (ref 0–1)
Eosinophils Absolute: 0.4 10*3/uL (ref 0.0–0.7)
Eosinophils Relative: 3 % (ref 0–5)
HCT: 37.5 % (ref 36.0–46.0)
MCHC: 32 g/dL (ref 30.0–36.0)
MCV: 91.7 fL (ref 78.0–100.0)
Monocytes Absolute: 1 10*3/uL (ref 0.1–1.0)
RDW: 15.4 % (ref 11.5–15.5)

## 2013-03-15 LAB — POCT I-STAT, CHEM 8
Calcium, Ion: 1.12 mmol/L — ABNORMAL LOW (ref 1.13–1.30)
Chloride: 94 mEq/L — ABNORMAL LOW (ref 96–112)
Creatinine, Ser: 1.1 mg/dL (ref 0.50–1.10)
Glucose, Bld: 210 mg/dL — ABNORMAL HIGH (ref 70–99)
HCT: 39 % (ref 36.0–46.0)
Hemoglobin: 13.3 g/dL (ref 12.0–15.0)
Sodium: 140 mEq/L (ref 135–145)
TCO2: 29 mmol/L (ref 0–100)

## 2013-03-15 LAB — COMPREHENSIVE METABOLIC PANEL
AST: 53 U/L — ABNORMAL HIGH (ref 0–37)
Albumin: 3.1 g/dL — ABNORMAL LOW (ref 3.5–5.2)
Calcium: 9.1 mg/dL (ref 8.4–10.5)
Creatinine, Ser: 1.03 mg/dL (ref 0.50–1.10)
GFR calc non Af Amer: 55 mL/min — ABNORMAL LOW (ref >90)

## 2013-03-15 LAB — URINE MICROSCOPIC-ADD ON

## 2013-03-15 LAB — POCT I-STAT TROPONIN I

## 2013-03-15 LAB — CG4 I-STAT (LACTIC ACID): Lactic Acid, Venous: 4.38 mmol/L — ABNORMAL HIGH (ref 0.5–2.2)

## 2013-03-15 LAB — MRSA PCR SCREENING: MRSA by PCR: POSITIVE — AB

## 2013-03-15 LAB — URINALYSIS, ROUTINE W REFLEX MICROSCOPIC
Glucose, UA: NEGATIVE mg/dL
Hgb urine dipstick: NEGATIVE
Specific Gravity, Urine: 1.015 (ref 1.005–1.030)
Urobilinogen, UA: 0.2 mg/dL (ref 0.0–1.0)

## 2013-03-15 LAB — GLUCOSE, CAPILLARY
Glucose-Capillary: 148 mg/dL — ABNORMAL HIGH (ref 70–99)
Glucose-Capillary: 227 mg/dL — ABNORMAL HIGH (ref 70–99)

## 2013-03-15 MED ORDER — ALBUTEROL SULFATE (5 MG/ML) 0.5% IN NEBU: 2.5000 mg | INHALATION_SOLUTION | Freq: Two times a day (BID) | RESPIRATORY_TRACT | Status: DC

## 2013-03-15 MED ORDER — ACETAMINOPHEN 650 MG RE SUPP: 650.0000 mg | Freq: Four times a day (QID) | RECTAL | Status: DC | PRN

## 2013-03-15 MED ORDER — IPRATROPIUM BROMIDE 0.02 % IN SOLN: 0.5000 mg | RESPIRATORY_TRACT | Status: DC | PRN

## 2013-03-15 MED ORDER — MOMETASONE FURO-FORMOTEROL FUM 100-5 MCG/ACT IN AERO
2.0000 | INHALATION_SPRAY | Freq: Two times a day (BID) | RESPIRATORY_TRACT | Status: DC
  Administered 2013-03-16: 2 via RESPIRATORY_TRACT
  Filled 2013-03-15: qty 8.8

## 2013-03-15 MED ORDER — CHLORHEXIDINE GLUCONATE CLOTH 2 % EX PADS
6.0000 | MEDICATED_PAD | Freq: Every day | CUTANEOUS | Status: AC
  Administered 2013-03-16 – 2013-03-20 (×5): 6 via TOPICAL

## 2013-03-15 MED ORDER — TOBRAMYCIN-DEXAMETHASONE 0.3-0.1 % OP SUSP
2.0000 [drp] | OPHTHALMIC | Status: DC
  Administered 2013-03-15 – 2013-03-16 (×2): 2 [drp] via OPHTHALMIC
  Filled 2013-03-15: qty 2.5

## 2013-03-15 MED ORDER — PIPERACILLIN-TAZOBACTAM 3.375 G IVPB
3.3750 g | Freq: Three times a day (TID) | INTRAVENOUS | Status: DC
  Administered 2013-03-16 – 2013-03-21 (×17): 3.375 g via INTRAVENOUS
  Filled 2013-03-15 (×20): qty 50

## 2013-03-15 MED ORDER — VITAMIN B-12 1000 MCG PO TABS
1000.0000 ug | ORAL_TABLET | Freq: Every day | ORAL | Status: DC
  Administered 2013-03-16 – 2013-03-21 (×6): 1000 ug via ORAL
  Filled 2013-03-15 (×6): qty 1

## 2013-03-15 MED ORDER — ALBUTEROL SULFATE (5 MG/ML) 0.5% IN NEBU: 2.5000 mg | INHALATION_SOLUTION | RESPIRATORY_TRACT | Status: DC | PRN

## 2013-03-15 MED ORDER — VANCOMYCIN HCL IN DEXTROSE 1-5 GM/200ML-% IV SOLN: 1000.0000 mg | Freq: Two times a day (BID) | INTRAVENOUS | Status: DC

## 2013-03-15 MED ORDER — SODIUM CHLORIDE 0.9 % IJ SOLN
3.0000 mL | Freq: Two times a day (BID) | INTRAMUSCULAR | Status: DC
  Administered 2013-03-15 – 2013-03-21 (×6): 3 mL via INTRAVENOUS

## 2013-03-15 MED ORDER — ONDANSETRON HCL 4 MG PO TABS: 4.0000 mg | ORAL_TABLET | Freq: Four times a day (QID) | ORAL | Status: DC | PRN

## 2013-03-15 MED ORDER — INSULIN DETEMIR 100 UNIT/ML ~~LOC~~ SOLN
SUBCUTANEOUS | Status: AC
  Filled 2013-03-15: qty 1

## 2013-03-15 MED ORDER — ALBUTEROL SULFATE (5 MG/ML) 0.5% IN NEBU
2.5000 mg | INHALATION_SOLUTION | RESPIRATORY_TRACT | Status: DC
  Administered 2013-03-15 – 2013-03-22 (×39): 2.5 mg via RESPIRATORY_TRACT
  Filled 2013-03-15 (×37): qty 0.5

## 2013-03-15 MED ORDER — IPRATROPIUM BROMIDE 0.02 % IN SOLN: 500.0000 ug | Freq: Two times a day (BID) | RESPIRATORY_TRACT | Status: DC

## 2013-03-15 MED ORDER — VANCOMYCIN HCL IN DEXTROSE 1-5 GM/200ML-% IV SOLN
1000.0000 mg | Freq: Once | INTRAVENOUS | Status: AC
  Filled 2013-03-15: qty 200

## 2013-03-15 MED ORDER — ALBUTEROL SULFATE (5 MG/ML) 0.5% IN NEBU
2.5000 mg | INHALATION_SOLUTION | RESPIRATORY_TRACT | Status: DC
  Administered 2013-03-15: 2.5 mg via RESPIRATORY_TRACT
  Filled 2013-03-15: qty 0.5

## 2013-03-15 MED ORDER — VANCOMYCIN HCL IN DEXTROSE 1-5 GM/200ML-% IV SOLN: 1000.0000 mg | Freq: Once | INTRAVENOUS | Status: DC

## 2013-03-15 MED ORDER — DIGOXIN 125 MCG PO TABS
0.1250 mg | ORAL_TABLET | Freq: Every day | ORAL | Status: DC
  Administered 2013-03-16 – 2013-03-17 (×2): 0.125 mg via ORAL
  Filled 2013-03-15 (×3): qty 1

## 2013-03-15 MED ORDER — HEPARIN SODIUM (PORCINE) 5000 UNIT/ML IJ SOLN
5000.0000 [IU] | Freq: Three times a day (TID) | INTRAMUSCULAR | Status: DC
  Administered 2013-03-15 – 2013-03-22 (×20): 5000 [IU] via SUBCUTANEOUS
  Filled 2013-03-15 (×20): qty 1

## 2013-03-15 MED ORDER — PIPERACILLIN-TAZOBACTAM 3.375 G IVPB 30 MIN
3.3750 g | Freq: Once | INTRAVENOUS | Status: AC
  Administered 2013-03-15: 3.375 g via INTRAVENOUS
  Filled 2013-03-15 (×2): qty 50

## 2013-03-15 MED ORDER — VANCOMYCIN HCL IN DEXTROSE 1-5 GM/200ML-% IV SOLN
1000.0000 mg | Freq: Once | INTRAVENOUS | Status: AC
  Administered 2013-03-15: 1000 mg via INTRAVENOUS

## 2013-03-15 MED ORDER — CALCITRIOL 0.25 MCG PO CAPS
0.2500 ug | ORAL_CAPSULE | Freq: Every day | ORAL | Status: DC
  Administered 2013-03-16: 0.25 ug via ORAL
  Filled 2013-03-15: qty 1

## 2013-03-15 MED ORDER — HYDROCORTISONE SOD SUCCINATE 100 MG IJ SOLR
100.0000 mg | Freq: Three times a day (TID) | INTRAMUSCULAR | Status: DC
  Administered 2013-03-15 – 2013-03-16 (×3): 100 mg via INTRAVENOUS
  Administered 2013-03-16: 16:00:00 via INTRAVENOUS
  Administered 2013-03-17 – 2013-03-18 (×5): 100 mg via INTRAVENOUS
  Filled 2013-03-15 (×9): qty 2

## 2013-03-15 MED ORDER — SODIUM CHLORIDE 0.9 % IV BOLUS (SEPSIS)
1000.0000 mL | Freq: Once | INTRAVENOUS | Status: AC
  Administered 2013-03-15: 1000 mL via INTRAVENOUS

## 2013-03-15 MED ORDER — FLUTICASONE PROPIONATE 50 MCG/ACT NA SUSP
2.0000 | Freq: Every day | NASAL | Status: DC
  Administered 2013-03-17 – 2013-03-21 (×5): 2 via NASAL
  Filled 2013-03-15 (×2): qty 16

## 2013-03-15 MED ORDER — MUPIROCIN 2 % EX OINT
1.0000 | TOPICAL_OINTMENT | Freq: Two times a day (BID) | CUTANEOUS | Status: AC
Start: 2013-03-15 — End: 2013-03-20
  Administered 2013-03-15 – 2013-03-20 (×10): 1 via NASAL
  Filled 2013-03-15 (×5): qty 22

## 2013-03-15 MED ORDER — LEVOTHYROXINE SODIUM 100 MCG PO TABS
200.0000 ug | ORAL_TABLET | Freq: Every day | ORAL | Status: DC
  Administered 2013-03-16 – 2013-03-21 (×6): 200 ug via ORAL
  Filled 2013-03-15 (×6): qty 2

## 2013-03-15 MED ORDER — ALPRAZOLAM 0.5 MG PO TABS
0.5000 mg | ORAL_TABLET | Freq: Three times a day (TID) | ORAL | Status: DC | PRN
  Administered 2013-03-16 – 2013-03-18 (×2): 0.5 mg via ORAL
  Filled 2013-03-15 (×2): qty 1

## 2013-03-15 MED ORDER — POTASSIUM CHLORIDE CRYS ER 20 MEQ PO TBCR
40.0000 meq | EXTENDED_RELEASE_TABLET | ORAL | Status: AC
  Administered 2013-03-15 – 2013-03-16 (×3): 40 meq via ORAL
  Filled 2013-03-15 (×3): qty 2

## 2013-03-15 MED ORDER — ACETAMINOPHEN 325 MG PO TABS
650.0000 mg | ORAL_TABLET | Freq: Four times a day (QID) | ORAL | Status: DC | PRN
  Administered 2013-03-16: 650 mg via ORAL
  Filled 2013-03-15: qty 2

## 2013-03-15 MED ORDER — IPRATROPIUM BROMIDE 0.02 % IN SOLN: 0.5000 mg | Freq: Two times a day (BID) | RESPIRATORY_TRACT | Status: DC

## 2013-03-15 MED ORDER — IPRATROPIUM BROMIDE 0.02 % IN SOLN
500.0000 ug | Freq: Four times a day (QID) | RESPIRATORY_TRACT | Status: DC
  Administered 2013-03-16 – 2013-03-22 (×25): 500 ug via RESPIRATORY_TRACT
  Filled 2013-03-15 (×22): qty 2.5

## 2013-03-15 MED ORDER — ASPIRIN 81 MG PO CHEW
81.0000 mg | CHEWABLE_TABLET | Freq: Every day | ORAL | Status: DC
  Administered 2013-03-16 – 2013-03-21 (×6): 81 mg via ORAL
  Filled 2013-03-15 (×6): qty 1

## 2013-03-15 MED ORDER — METOCLOPRAMIDE HCL 10 MG PO TABS
10.0000 mg | ORAL_TABLET | Freq: Three times a day (TID) | ORAL | Status: DC
  Administered 2013-03-15 – 2013-03-21 (×25): 10 mg via ORAL
  Filled 2013-03-15 (×24): qty 1

## 2013-03-15 MED ORDER — PIPERACILLIN-TAZOBACTAM 3.375 G IVPB
INTRAVENOUS | Status: AC
  Filled 2013-03-15: qty 50

## 2013-03-15 MED ORDER — PANTOPRAZOLE SODIUM 40 MG PO TBEC
40.0000 mg | DELAYED_RELEASE_TABLET | Freq: Two times a day (BID) | ORAL | Status: DC
  Administered 2013-03-15 – 2013-03-16 (×2): 40 mg via ORAL
  Filled 2013-03-15 (×2): qty 1

## 2013-03-15 MED ORDER — INSULIN DETEMIR 100 UNIT/ML ~~LOC~~ SOLN
55.0000 [IU] | Freq: Every day | SUBCUTANEOUS | Status: DC
  Administered 2013-03-15 – 2013-03-21 (×7): 55 [IU] via SUBCUTANEOUS
  Filled 2013-03-15 (×7): qty 0.55

## 2013-03-15 MED ORDER — GABAPENTIN 100 MG PO CAPS
100.0000 mg | ORAL_CAPSULE | Freq: Three times a day (TID) | ORAL | Status: DC
  Administered 2013-03-15 – 2013-03-21 (×19): 100 mg via ORAL
  Filled 2013-03-15 (×19): qty 1

## 2013-03-15 MED ORDER — ATORVASTATIN CALCIUM 40 MG PO TABS
80.0000 mg | ORAL_TABLET | Freq: Every day | ORAL | Status: DC
  Administered 2013-03-16 – 2013-03-21 (×6): 80 mg via ORAL
  Filled 2013-03-15 (×6): qty 2

## 2013-03-15 MED ORDER — MAGNESIUM SULFATE 40 MG/ML IJ SOLN
3.0000 g | Freq: Once | INTRAMUSCULAR | Status: AC
  Administered 2013-03-15: 3 g via INTRAVENOUS
  Filled 2013-03-15: qty 100

## 2013-03-15 MED ORDER — ONDANSETRON HCL 4 MG/2ML IJ SOLN: 4.0000 mg | Freq: Four times a day (QID) | INTRAMUSCULAR | Status: DC | PRN

## 2013-03-15 MED ORDER — MAGNESIUM SULFATE 50 % IJ SOLN: 3.0000 g | Freq: Once | INTRAVENOUS | Status: DC

## 2013-03-15 MED ORDER — ALBUTEROL SULFATE (5 MG/ML) 0.5% IN NEBU: 2.5000 mg | INHALATION_SOLUTION | Freq: Three times a day (TID) | RESPIRATORY_TRACT | Status: DC

## 2013-03-15 MED ORDER — HYDROCODONE-ACETAMINOPHEN 5-325 MG PO TABS
1.0000 | ORAL_TABLET | Freq: Four times a day (QID) | ORAL | Status: DC | PRN
  Administered 2013-03-16 – 2013-03-18 (×2): 1 via ORAL
  Filled 2013-03-15 (×2): qty 1

## 2013-03-15 MED ORDER — POLYETHYLENE GLYCOL 3350 17 G PO PACK
17.0000 g | PACK | Freq: Every day | ORAL | Status: DC
  Administered 2013-03-16: 17 g via ORAL
  Filled 2013-03-15: qty 1

## 2013-03-15 MED ORDER — POTASSIUM CHLORIDE 10 MEQ/100ML IV SOLN
10.0000 meq | Freq: Once | INTRAVENOUS | Status: AC
  Administered 2013-03-15: 10 meq via INTRAVENOUS
  Filled 2013-03-15: qty 100

## 2013-03-15 MED ORDER — NITROGLYCERIN 0.4 MG SL SUBL
0.4000 mg | SUBLINGUAL_TABLET | SUBLINGUAL | Status: DC | PRN
Start: 2013-03-15 — End: 2013-03-22

## 2013-03-15 MED ORDER — IPRATROPIUM BROMIDE 0.02 % IN SOLN
500.0000 ug | Freq: Four times a day (QID) | RESPIRATORY_TRACT | Status: DC
  Administered 2013-03-15: 500 ug via RESPIRATORY_TRACT
  Filled 2013-03-15: qty 2.5

## 2013-03-15 MED ORDER — INSULIN ASPART 100 UNIT/ML ~~LOC~~ SOLN
0.0000 [IU] | Freq: Three times a day (TID) | SUBCUTANEOUS | Status: DC
  Administered 2013-03-16: 11 [IU] via SUBCUTANEOUS
  Administered 2013-03-16 – 2013-03-17 (×2): 7 [IU] via SUBCUTANEOUS
  Administered 2013-03-17: 11 [IU] via SUBCUTANEOUS
  Administered 2013-03-17: 4 [IU] via SUBCUTANEOUS
  Administered 2013-03-18: 11 [IU] via SUBCUTANEOUS
  Administered 2013-03-18 (×2): 7 [IU] via SUBCUTANEOUS
  Administered 2013-03-19 (×2): 3 [IU] via SUBCUTANEOUS
  Administered 2013-03-20 (×2): 4 [IU] via SUBCUTANEOUS

## 2013-03-15 MED ORDER — SODIUM CHLORIDE 0.9 % IV SOLN
INTRAVENOUS | Status: DC
  Administered 2013-03-15 – 2013-03-16 (×2): via INTRAVENOUS
  Filled 2013-03-15 (×9): qty 1000

## 2013-03-15 MED ORDER — IPRATROPIUM BROMIDE 0.02 % IN SOLN: 500.0000 ug | Freq: Three times a day (TID) | RESPIRATORY_TRACT | Status: DC

## 2013-03-15 NOTE — ED Notes (Signed)
Initial Bolus infusion complete. Dr. Bebe Shaggy aware of pt latest vitals.

## 2013-03-15 NOTE — ED Notes (Signed)
Chem 8 redrawn

## 2013-03-15 NOTE — ED Notes (Signed)
Patient began feeling dizzy and like she was going to pass out around 1230.  Took blood glucose and it was 211.

## 2013-03-15 NOTE — Progress Notes (Signed)
[facility] notified that a code sepsis has been called at 1805.

## 2013-03-15 NOTE — ED Notes (Signed)
Resting in bed with family at bsd. Pt alert and appropriate.
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
## 2013-03-16 DIAGNOSIS — I509 Heart failure, unspecified: Secondary | ICD-10-CM | POA: Diagnosis not present

## 2013-03-16 DIAGNOSIS — J449 Chronic obstructive pulmonary disease, unspecified: Secondary | ICD-10-CM | POA: Diagnosis not present

## 2013-03-16 DIAGNOSIS — I5032 Chronic diastolic (congestive) heart failure: Secondary | ICD-10-CM | POA: Diagnosis not present

## 2013-03-16 DIAGNOSIS — A419 Sepsis, unspecified organism: Secondary | ICD-10-CM | POA: Diagnosis not present

## 2013-03-16 DIAGNOSIS — E1142 Type 2 diabetes mellitus with diabetic polyneuropathy: Secondary | ICD-10-CM | POA: Diagnosis not present

## 2013-03-16 DIAGNOSIS — N390 Urinary tract infection, site not specified: Secondary | ICD-10-CM | POA: Diagnosis not present

## 2013-03-16 LAB — COMPREHENSIVE METABOLIC PANEL
AST: 70 U/L — ABNORMAL HIGH (ref 0–37)
Albumin: 3.1 g/dL — ABNORMAL LOW (ref 3.5–5.2)
BUN: 12 mg/dL (ref 6–23)
Calcium: 9 mg/dL (ref 8.4–10.5)
Creatinine, Ser: 0.81 mg/dL (ref 0.50–1.10)
GFR calc Af Amer: 85 mL/min — ABNORMAL LOW (ref >90)
GFR calc non Af Amer: 73 mL/min — ABNORMAL LOW (ref >90)
Total Bilirubin: 0.9 mg/dL (ref 0.3–1.2)
Total Protein: 6.5 g/dL (ref 6.0–8.3)

## 2013-03-16 LAB — CBC
Hemoglobin: 11.9 g/dL — ABNORMAL LOW (ref 12.0–15.0)
MCH: 28.7 pg (ref 26.0–34.0)
MCV: 89.9 fL (ref 78.0–100.0)
Platelets: 183 10*3/uL (ref 150–400)
RBC: 4.15 MIL/uL (ref 3.87–5.11)
WBC: 7.7 10*3/uL (ref 4.0–10.5)

## 2013-03-16 LAB — LACTIC ACID, PLASMA: Lactic Acid, Venous: 1.2 mmol/L (ref 0.5–2.2)

## 2013-03-16 LAB — POCT I-STAT, CHEM 8
Calcium, Ion: 1.12 mmol/L — ABNORMAL LOW (ref 1.13–1.30)
Chloride: 95 mEq/L — ABNORMAL LOW (ref 96–112)
Creatinine, Ser: 1.2 mg/dL — ABNORMAL HIGH (ref 0.50–1.10)
Glucose, Bld: 192 mg/dL — ABNORMAL HIGH (ref 70–99)
Hemoglobin: 13.9 g/dL (ref 12.0–15.0)
Potassium: 2.1 mEq/L — CL (ref 3.5–5.1)
TCO2: 30 mmol/L (ref 0–100)

## 2013-03-16 LAB — GLUCOSE, CAPILLARY
Glucose-Capillary: 278 mg/dL — ABNORMAL HIGH (ref 70–99)
Glucose-Capillary: 258 mg/dL — ABNORMAL HIGH (ref 70–99)

## 2013-03-16 LAB — POCT I-STAT TROPONIN I

## 2013-03-16 LAB — MAGNESIUM: Magnesium: 2.7 mg/dL — ABNORMAL HIGH (ref 1.5–2.5)

## 2013-03-16 LAB — TSH: TSH: 0.835 u[IU]/mL (ref 0.350–4.500)

## 2013-03-16 MED ORDER — VANCOMYCIN HCL IN DEXTROSE 1-5 GM/200ML-% IV SOLN
1000.0000 mg | Freq: Two times a day (BID) | INTRAVENOUS | Status: DC
  Administered 2013-03-16 – 2013-03-21 (×10): 1000 mg via INTRAVENOUS
  Filled 2013-03-16 (×12): qty 200

## 2013-03-16 MED ORDER — ASPIRIN EC 81 MG PO TBEC: 81.0000 mg | DELAYED_RELEASE_TABLET | Freq: Every day | ORAL | Status: DC

## 2013-03-16 MED ORDER — HYPROMELLOSE (GONIOSCOPIC) 2.5 % OP SOLN: 1.0000 [drp] | Freq: Three times a day (TID) | OPHTHALMIC | Status: DC | PRN

## 2013-03-16 MED ORDER — SERTRALINE HCL 50 MG PO TABS
50.0000 mg | ORAL_TABLET | Freq: Every day | ORAL | Status: DC
  Administered 2013-03-16 – 2013-03-21 (×6): 50 mg via ORAL
  Filled 2013-03-16 (×6): qty 1

## 2013-03-16 MED ORDER — POLYVINYL ALCOHOL 1.4 % OP SOLN
1.0000 [drp] | Freq: Three times a day (TID) | OPHTHALMIC | Status: DC | PRN
  Filled 2013-03-16: qty 15

## 2013-03-16 MED ORDER — CARVEDILOL 3.125 MG PO TABS
6.2500 mg | ORAL_TABLET | Freq: Two times a day (BID) | ORAL | Status: DC
  Administered 2013-03-17 (×2): 6.25 mg via ORAL
  Filled 2013-03-16 (×2): qty 2

## 2013-03-16 MED ORDER — ALBUTEROL SULFATE HFA 108 (90 BASE) MCG/ACT IN AERS: 2.0000 | INHALATION_SPRAY | RESPIRATORY_TRACT | Status: DC | PRN

## 2013-03-16 NOTE — Progress Notes (Deleted)
PT Cancellation Note  Patient Details Name: Ann [name] MRN: [id number] DOB: Oct 03, 1945   Cancelled Treatment:    Reason Eval/Treat Not Completed: Patient at procedure or test/unavailable  (recieving a bath)   Colene Mines 03/16/2013, 11:38 AM

## 2013-03-16 NOTE — Progress Notes (Signed)
UR chart review completed.  

## 2013-03-16 NOTE — Evaluation (Signed)
Physical Therapy Evaluation Patient Details Name: Ann [name] MRN: [id number] DOB: 07-18-1945 Today's Date: 03/16/2013 Time: 4132-4401 PT Time Calculation (min): 8 min  PT Assessment / Plan / Recommendation History of Present Illness  With history of COPD, adrenal insufficiency, diabetes, hypothyroidism, morbid obesity who presented to the ED with generalized weakness and dizziness.  She was placed on IV vancomyosin for sepsis and was found to be hypokalemic.   Clinical Impression  Pt is a pleasant 67 year old female who is referred to PT with impairments listed below.  After her bath, she was willing to get up and sit in a chair.  Limited time spent today due to O2 sats remaining between 85-88%, pt able to transfer with O2 sats declining to 87% and returned to 88% after transfer and encouraged on diaphragmatic breathing.  Respiratory therapy was also in room and performed breathing treatment after transfer.  Overall pt will benefit from skilled PT for generalized LE weakness.     PT Assessment  Patient needs continued PT services    Follow Up Recommendations  Home health PT    Does the patient have the potential to tolerate intense rehabilitation      Barriers to Discharge        Equipment Recommendations  None recommended by PT    Recommendations for Other Services     Frequency Min 3X/week    Precautions / Restrictions Restrictions Weight Bearing Restrictions: No   Pertinent Vitals/Pain O2 sats: 85-88%      Mobility  Bed Mobility Bed Mobility: Left Sidelying to Sit Left Sidelying to Sit: 4: Min assist;With rails;HOB flat Transfers Transfers: Sit to Stand;Stand to Sit Sit to Stand: 4: Min assist;From bed Stand to Sit: 4: Min assist;To chair/3-in-1 Ambulation/Gait Ambulation/Gait Assistance: 4: Min guard Ambulation Distance (Feet): 3 Feet Assistive device: Rolling walker Gait Pattern: Step-to pattern    Exercises     PT Diagnosis: Generalized weakness  PT  Problem List: Decreased mobility;Cardiopulmonary status limiting activity PT Treatment Interventions: Gait training;Functional mobility training;Therapeutic activities;Therapeutic exercise;Patient/family education     PT Goals(Current goals can be found in the care plan section) Acute Rehab PT Goals Patient Stated Goal: "Go home with family" PT Goal Formulation: With patient/family Time For Goal Achievement: 03/21/13 Potential to Achieve Goals: Good  Visit Information  Last PT Received On: 03/16/13 Reason Eval/Treat Not Completed: Patient at procedure or test/unavailable History of Present Illness: With history of COPD, adrenal insufficiency, diabetes, hypothyroidism, morbid obesity who presented to the ED with generalized weakness and dizziness.  She was placed on IV vancomyosin for sepsis and was found to be hypokalemic.        Prior Functioning  Home Living Family/patient expects to be discharged to:: Private residence Living Arrangements: Spouse/significant other Available Help at Discharge: Family Type of Home: House Home Access: Ramped entrance Home Layout: One level Home Equipment: Environmental consultant - 2 wheels;Wheelchair - [employer];Hospital bed Prior Function Level of Independence: Needs assistance Gait / Transfers Assistance Needed: W/C for community, RW for home Communication Communication: No difficulties Dominant Hand: Right    Cognition  Cognition Arousal/Alertness: Awake/alert Behavior During Therapy: WFL for tasks assessed/performed Overall Cognitive Status: Within Functional Limits for tasks assessed    Extremity/Trunk Assessment Lower Extremity Assessment Lower Extremity Assessment: Generalized weakness   Balance Balance Balance Assessed: Yes Static Standing Balance Static Standing - Comment/# of Minutes: able to stand EOB w/BUE A on RW x2 minutes with O2 sats remaining 87-88%  End of Session PT - End  of Session Equipment Utilized During Treatment:  Gait belt Activity Tolerance: Patient tolerated treatment well Patient left: in chair;with family/visitor present Nurse Communication: Mobility status  GP     Eliasar Hlavaty, MPT, ATC  03/16/2013, 11:58 AM

## 2013-03-16 NOTE — Care Management Note (Signed)
    Page 1 of 2   03/22/2013     8:52:49 AM   CARE MANAGEMENT NOTE 03/22/2013  Patient:  Ann, [name]   Account Number:  [id number]  Date Initiated:  03/16/2013  Documentation initiated by:  Sharrie Rothman  Subjective/Objective Assessment:   Pt admitted from home with sepsis. Pt lives with her husband and will return home at discharge. Pt is active with [facility] RN, PT, and aide. Pt has home O2, BSC, walker, cane, hospital bed, and w/c for home use.     Action/Plan:   Will arrange resumption of HH at discharge. Will continue to follow for [facility] needs.   Anticipated DC Date:  03/19/2013   Anticipated DC Plan:  HOME W HOME HEALTH SERVICES      DC Planning Services  CM consult      [facility] Choice  Resumption Of Svcs/PTA Provider   Choice offered to / List presented to:  C-1 Patient        HH arranged  HH-1 RN  HH-2 PT  HH-4 NURSE'S AIDE      HH agency  [facility]   Status of service:  Completed, signed off Medicare Important Message given?  YES (If response is "NO", the following Medicare IM given date fields will be blank) Date Medicare IM given:  03/19/2013 Date Additional Medicare IM given:  03/22/2013  Discharge Disposition:  HOME W HOME HEALTH SERVICES  Per UR Regulation:    If discussed at Long Length of Stay Meetings, dates discussed:    Comments:  03/22/13 0850 Arlyss Queen, RN BSN CM Pt discharged home today with [facility] RN, PT, and aide. Alroy Bailiff of [facility] is aware and will  collect the pts information from the chart. Hh services to start within 48 hours of discharge. No DME needs noted. Pt and pts nurse aware of discharge arrangements.  03/16/13 1405 Arlyss Queen, RN BSN CM

## 2013-03-16 NOTE — Progress Notes (Signed)
Subjective: She was admitted yesterday with sepsis. She was also markedly hypokalemic. Her medication list shows a she's taking IV Lasix which is incorrect. She says she's taking oral Lasix and she is supposed to be on potassium but is not totally clear if she's taking that or not. Her family will bring in current list. She says she feels better. She says she actually felt well yesterday until she got rid the evening she basically felt like she was "blacking out"  Objective: Vital signs in last 24 hours: Temp:  [97.4 F (36.3 C)-100.5 F (38.1 C)] 98.1 F (36.7 C) (10/21 0400) Pulse Rate:  [41-66] 66 (10/20 1845) Resp:  [18-27] 27 (10/20 1845) BP: (62-157)/(36-70) 157/70 mmHg (10/21 0500) SpO2:  [93 %-100 %] 96 % (10/21 0710) Weight:  [98.793 kg (217 lb 12.8 oz)-104.3 kg (229 lb 15 oz)] 104.3 kg (229 lb 15 oz) (10/21 0500) Weight change:  Last BM Date: 03/15/13  Intake/Output from previous day: 10/20 0701 - 10/21 0700 In: 220 [P.O.:220] Out: 700 [Urine:700]  PHYSICAL EXAM General appearance: alert, cooperative, mild distress and morbidly obese Resp: rhonchi bilaterally Cardio: regular rate and rhythm, S1, S2 normal, no murmur, click, rub or gallop GI: soft, non-tender; bowel sounds normal; no masses,  no organomegaly Extremities: extremities normal, atraumatic, no cyanosis or edema  Lab Results:    Basic Metabolic Panel:  Recent Labs  16/10/96 1433  03/15/13 1508 03/15/13 1719 03/16/13 0519  NA 137  < > 140  --  137  K 2.2*  < > 2.1*  --  3.5  CL 94*  < > 95*  --  98  CO2 32  --   --   --  31  GLUCOSE 222*  < > 192*  --  221*  BUN 18  < > 16  --  12  CREATININE 1.03  < > 1.20*  --  0.81  CALCIUM 9.1  --   --   --  9.0  MG  --   --   --  1.6 2.7*  < > = values in this interval not displayed. Liver Function Tests:  Recent Labs  03/15/13 1433 03/16/13 0519  AST 53* 70*  ALT 50* 62*  ALKPHOS 101 106  BILITOT 0.4 0.9  PROT 6.3 6.5  ALBUMIN 3.1* 3.1*   No  results found for this basename: LIPASE, AMYLASE,  in the last 72 hours No results found for this basename: AMMONIA,  in the last 72 hours CBC:  Recent Labs  03/15/13 1433  03/15/13 1508 03/16/13 0519  WBC 11.0*  --   --  7.7  NEUTROABS 7.5  --   --   --   HGB 12.0  < > 13.9 11.9*  HCT 37.5  < > 41.0 37.3  MCV 91.7  --   --  89.9  PLT 198  --   --  183  < > = values in this interval not displayed. Cardiac Enzymes:  Recent Labs  03/15/13 1814 03/16/13 0012 03/16/13 0519  TROPONINI <0.30 <0.30 <0.30   BNP: No results found for this basename: PROBNP,  in the last 72 hours D-Dimer: No results found for this basename: DDIMER,  in the last 72 hours CBG:  Recent Labs  03/15/13 1802 03/15/13 2118 03/16/13 0727  GLUCAP 148* 227* 201*   Hemoglobin A1C:  Recent Labs  03/15/13 1814  HGBA1C 6.7*   Fasting Lipid Panel: No results found for this basename: CHOL, HDL, LDLCALC, TRIG, CHOLHDL, LDLDIRECT,  in the last 72 hours Thyroid Function Tests: No results found for this basename: TSH, T4TOTAL, FREET4, T3FREE, THYROIDAB,  in the last 72 hours Anemia Panel: No results found for this basename: VITAMINB12, FOLATE, FERRITIN, TIBC, IRON, RETICCTPCT,  in the last 72 hours Coagulation: No results found for this basename: LABPROT, INR,  in the last 72 hours Urine Drug Screen: Drugs of Abuse  No results found for this basename: labopia, cocainscrnur, labbenz, amphetmu, thcu, labbarb    Alcohol Level: No results found for this basename: ETH,  in the last 72 hours Urinalysis:  Recent Labs  03/15/13 1504  COLORURINE YELLOW  LABSPEC 1.015  PHURINE 6.5  GLUCOSEU NEGATIVE  HGBUR NEGATIVE  BILIRUBINUR NEGATIVE  KETONESUR NEGATIVE  PROTEINUR TRACE*  UROBILINOGEN 0.2  NITRITE NEGATIVE  LEUKOCYTESUR SMALL*   Misc. Labs:  ABGS  Recent Labs  03/15/13 1520  PHART 7.399  PO2ART 78.2*  TCO2 25.8  HCO3 29.0*   CULTURES Recent Results (from the past 240 hour(s))  MRSA  PCR SCREENING     Status: Abnormal   Collection Time    03/15/13  5:56 PM      Result Value Range Status   MRSA by PCR POSITIVE (*) NEGATIVE Final   Comment:            The GeneXpert MRSA Assay (FDA     approved for NASAL specimens     only), is one component of a     comprehensive MRSA colonization     surveillance program. It is not     intended to diagnose MRSA     infection nor to guide or     monitor treatment for     MRSA infections.     RESULT CALLED TO, READ BACK BY AND VERIFIED WITH:     STONE A AT 2030 ON 102014 BY FORSYTH K   Studies/Results: Dg Chest Port 1 View  03/15/2013   CLINICAL DATA:  Lobe blood pressure.  EXAM: PORTABLE CHEST - 1 VIEW  COMPARISON:  10/14/2012.  FINDINGS: Left central line tip proximal superior vena cava level.  Cardiomegaly.  Central pulmonary vascular prominence.  Slightly limited evaluation left lung base secondary to cardiomegaly and technique. No segmental consolidation noted.  No gross pneumothorax.  Prior cervical spine surgery.  Calcified slightly tortuous aorta.  IMPRESSION: Left central line tip proximal superior vena cava level.  Cardiomegaly.  Central pulmonary vascular prominence.  Slightly limited evaluation left lung base secondary to cardiomegaly and technique. No segmental consolidation noted.  Calcified slightly tortuous aorta.   Electronically Signed   By: Bridgett Larsson M.D.   On: 03/15/2013 14:55    Medications:  Prior to Admission:  Prescriptions prior to admission  Medication Sig Dispense Refill  . albuterol (PROVENTIL HFA;VENTOLIN HFA) 108 (90 BASE) MCG/ACT inhaler Inhale 2 puffs into the lungs every 6 (six) hours as needed for wheezing.      Marland Kitchen albuterol (PROVENTIL) (5 MG/ML) 0.5% nebulizer solution Take 0.5 mLs (2.5 mg total) by nebulization every 4 (four) hours.  20 mL  12  . ALPRAZolam (XANAX) 0.5 MG tablet Take 1 tablet (0.5 mg total) by mouth 3 (three) times daily as needed for sleep.    0  . alum & mag hydroxide-simeth  (MAALOX/MYLANTA) 200-200-20 MG/5ML suspension Take 30 mLs by mouth every 4 (four) hours as needed.  355 mL  0  . aspirin 81 MG chewable tablet Chew 1 tablet (81 mg total) by mouth daily.      Marland Kitchen  atorvastatin (LIPITOR) 80 MG tablet Take 1 tablet (80 mg total) by mouth daily at 6 PM.      . calcitRIOL (ROCALTROL) 0.25 MCG capsule Take 1 capsule (0.25 mcg total) by mouth daily.      . digoxin (LANOXIN) 0.125 MG tablet Take 1 tablet (0.125 mg total) by mouth daily.      Marland Kitchen diltiazem (CARDIZEM) 120 MG tablet Take 1 tablet (120 mg total) by mouth daily.      . fesoterodine (TOVIAZ) 8 MG TB24 Take 1 tablet (8 mg total) by mouth daily.  30 tablet    . fluconazole (DIFLUCAN) 100 MG tablet Take 1 tablet (100 mg total) by mouth daily.      . fluticasone (FLONASE) 50 MCG/ACT nasal spray Place 2 sprays into the nose daily.    2  . furosemide (LASIX) 10 MG/ML injection Inject 8 mLs (80 mg total) into the vein every 12 (twelve) hours.  4 mL  0  . gabapentin (NEURONTIN) 100 MG capsule Take 1 capsule (100 mg total) by mouth 3 (three) times daily.      . heparin 5000 UNIT/ML injection Inject 1 mL (5,000 Units total) into the skin every 8 (eight) hours.  1 mL    . HYDROcodone-acetaminophen (NORCO) 5-325 MG per tablet Take 1 tablet by mouth every 6 (six) hours as needed.  30 tablet  0  . insulin aspart (NOVOLOG) 100 UNIT/ML injection Inject 0-20 Units into the skin 3 (three) times daily with meals.  1 vial  12  . insulin aspart (NOVOLOG) 100 UNIT/ML injection Inject 0-5 Units into the skin at bedtime.  1 vial  12  . insulin aspart (NOVOLOG) 100 UNIT/ML injection Inject 3 Units into the skin 3 (three) times daily with meals.  1 vial  12  . insulin detemir (LEVEMIR) 100 UNIT/ML injection Inject 0.55 mLs (55 Units total) into the skin at bedtime.  10 mL  12  . ipratropium (ATROVENT) 0.02 % nebulizer solution Take 2.5 mLs (500 mcg total) by nebulization 4 (four) times daily.  75 mL  12  . levothyroxine (SYNTHROID,  LEVOTHROID) 200 MCG tablet Take 1 tablet (200 mcg total) by mouth daily before breakfast.      . linezolid (ZYVOX) 600 MG tablet Take 1 tablet (600 mg total) by mouth every 12 (twelve) hours.      Marland Kitchen loperamide (IMODIUM) 2 MG capsule Take 1 capsule (2 mg total) by mouth as needed for diarrhea or loose stools.  30 capsule  0  . metoCLOPramide (REGLAN) 10 MG tablet Take 1 tablet (10 mg total) by mouth 4 (four) times daily -  before meals and at bedtime.      . polyethylene glycol (MIRALAX / GLYCOLAX) packet Take 17 g by mouth daily.  14 each  0  . potassium chloride SA (K-DUR,KLOR-CON) 20 MEQ tablet Take 2 tablets (40 mEq total) by mouth 3 (three) times daily.      . predniSONE (DELTASONE) 20 MG tablet Take 1 tablet (20 mg total) by mouth daily with breakfast.      . sodium chloride 0.45 % SOLN 1,000 mL with potassium chloride 2 mEq/ml SOLN 40 mEq Inject 50 mLs into the vein continuous.    0  . sodium chloride 0.9 % injection Inject 3 mLs into the vein every 12 (twelve) hours.  5 mL    . sucralfate (CARAFATE) 1 GM/10ML suspension Take 10 mLs (1 g total) by mouth 4 (four) times daily -  with meals  and at bedtime.  420 mL  0  . tobramycin-dexamethasone (TOBRADEX) ophthalmic solution Place 2 drops into both eyes every 4 (four) hours while awake.  5 mL  0  . vitamin B-12 1000 MCG tablet Take 1 tablet (1,000 mcg total) by mouth daily.      . metroNIDAZOLE (FLAGYL) 500 MG tablet Take 1 tablet (500 mg total) by mouth every 8 (eight) hours.      . mometasone-formoterol (DULERA) 100-5 MCG/ACT AERO Inhale 2 puffs into the lungs 2 (two) times daily.      . nitroGLYCERIN (NITROSTAT) 0.4 MG SL tablet Place 1 tablet (0.4 mg total) under the tongue every 5 (five) minutes as needed for chest pain.    12  . ondansetron (ZOFRAN) 4 MG tablet Take 1 tablet (4 mg total) by mouth every 6 (six) hours as needed for nausea.  20 tablet  0  . ondansetron (ZOFRAN) 4 MG tablet Take 1 tablet (4 mg total) by mouth every 6 (six) hours  as needed for nausea.  20 tablet  0  . pantoprazole (PROTONIX) 40 MG tablet Take 1 tablet (40 mg total) by mouth 2 (two) times daily before a meal.       Scheduled: . albuterol  2.5 mg Nebulization Q4H  . aspirin  81 mg Oral Daily  . atorvastatin  80 mg Oral q1800  . calcitRIOL  0.25 mcg Oral Daily  . Chlorhexidine Gluconate Cloth  6 each Topical Q0600  . digoxin  0.125 mg Oral Daily  . fluticasone  2 spray Each Nare Daily  . gabapentin  100 mg Oral TID  . heparin  5,000 Units Subcutaneous Q8H  . hydrocortisone sod succinate (SOLU-CORTEF) inj  100 mg Intravenous Q8H  . insulin aspart  0-20 Units Subcutaneous TID WC  . insulin detemir  55 Units Subcutaneous QHS  . ipratropium  500 mcg Nebulization QID  . levothyroxine  200 mcg Oral QAC breakfast  . metoCLOPramide  10 mg Oral TID AC & HS  . mometasone-formoterol  2 puff Inhalation BID  . mupirocin ointment  1 application Nasal BID  . pantoprazole  40 mg Oral BID AC  . piperacillin-tazobactam (ZOSYN)  IV  3.375 g Intravenous Q8H  . polyethylene glycol  17 g Oral Daily  . sodium chloride  3 mL Intravenous Q12H  . tobramycin-dexamethasone  2 drop Both Eyes Q4H while awake  . vitamin B-12  1,000 mcg Oral Daily   Continuous: . sodium chloride 0.9 % 1,000 mL with potassium chloride 40 mEq infusion 100 mL/hr at 03/15/13 1807   YQM:[id number], acetaminophen, albuterol, ALPRAZolam, HYDROcodone-acetaminophen, ipratropium, nitroGLYCERIN, ondansetron (ZOFRAN) IV, ondansetron  Assesment: She has multiple medical problems including sepsis. This may be from urinary tract infection. She has a history of coronary artery occlusive disease and chronic diastolic heart failure. She was hypokalemic on admission. She has had atrial fibrillation. Her troponin level was elevated I think that is more of a issue related to her sepsis. She has adrenal insufficiency and with her sepsis will need to have increased dose of steroids. Principal Problem:    Sepsis Active Problems:   HYPOTHYROIDISM   HYPERLIPIDEMIA   HYPERTENSION   CAD   GASTROESOPHAGEAL REFLUX DISEASE   RENAL FAILURE, CHRONIC   Diabetes mellitus type 2 with complications   Chronic diastolic heart failure   Adrenal insufficiency   UTI (lower urinary tract infection)   Hypokalemia   Morbid obesity   Atrial fibrillation   Hypotension   Elevated troponin  Plan: She will continue with current antibiotics. Her potassium has been repleted. I will ask her family to bring in her medications because with her multiple hospitalizations nursing home stays etc. I don't think the current medication list is correct. Point-of-care troponins were elevated but troponin from blood testing serially is normal I think this may be artifact from her sepsis    LOS: 1 day   Aashna Matson L 03/16/2013, 8:20 AM

## 2013-03-17 DIAGNOSIS — N390 Urinary tract infection, site not specified: Secondary | ICD-10-CM | POA: Diagnosis not present

## 2013-03-17 DIAGNOSIS — A419 Sepsis, unspecified organism: Secondary | ICD-10-CM | POA: Diagnosis not present

## 2013-03-17 DIAGNOSIS — J449 Chronic obstructive pulmonary disease, unspecified: Secondary | ICD-10-CM | POA: Diagnosis not present

## 2013-03-17 DIAGNOSIS — I369 Nonrheumatic tricuspid valve disorder, unspecified: Secondary | ICD-10-CM | POA: Diagnosis not present

## 2013-03-17 LAB — GLUCOSE, CAPILLARY
Glucose-Capillary: 192 mg/dL — ABNORMAL HIGH (ref 70–99)
Glucose-Capillary: 207 mg/dL — ABNORMAL HIGH (ref 70–99)
Glucose-Capillary: 261 mg/dL — ABNORMAL HIGH (ref 70–99)

## 2013-03-17 LAB — CBC WITH DIFFERENTIAL/PLATELET
Basophils Absolute: 0 10*3/uL (ref 0.0–0.1)
Basophils Relative: 0 % (ref 0–1)
Eosinophils Relative: 0 % (ref 0–5)
HCT: 38.7 % (ref 36.0–46.0)
Lymphocytes Relative: 7 % — ABNORMAL LOW (ref 12–46)
Lymphs Abs: 0.9 10*3/uL (ref 0.7–4.0)
MCHC: 32 g/dL (ref 30.0–36.0)
MCV: 89.4 fL (ref 78.0–100.0)
Monocytes Absolute: 0.9 10*3/uL (ref 0.1–1.0)
Neutro Abs: 11.4 10*3/uL — ABNORMAL HIGH (ref 1.7–7.7)
Neutrophils Relative %: 86 % — ABNORMAL HIGH (ref 43–77)
Platelets: 209 10*3/uL (ref 150–400)
RBC: 4.33 MIL/uL (ref 3.87–5.11)
RDW: 15.6 % — ABNORMAL HIGH (ref 11.5–15.5)
WBC: 13.3 10*3/uL — ABNORMAL HIGH (ref 4.0–10.5)

## 2013-03-17 LAB — BASIC METABOLIC PANEL
BUN: 13 mg/dL (ref 6–23)
Calcium: 9.2 mg/dL (ref 8.4–10.5)
Creatinine, Ser: 0.7 mg/dL (ref 0.50–1.10)
GFR calc Af Amer: >90 mL/min (ref >90)
GFR calc non Af Amer: 88 mL/min — ABNORMAL LOW (ref >90)
Potassium: 3.4 mEq/L — ABNORMAL LOW (ref 3.5–5.1)
Sodium: 139 mEq/L (ref 135–145)

## 2013-03-17 LAB — URINE CULTURE
Colony Count: >=100000
Colony Count: NO GROWTH
Culture: NO GROWTH

## 2013-03-17 LAB — PROCALCITONIN: Procalcitonin: <0.1 ng/mL

## 2013-03-17 MED ORDER — OXYCODONE HCL 5 MG PO TABS: 5.0000 mg | ORAL_TABLET | ORAL | Status: DC | PRN

## 2013-03-17 MED ORDER — CLONIDINE HCL 0.1 MG PO TABS
0.1000 mg | ORAL_TABLET | Freq: Two times a day (BID) | ORAL | Status: DC
  Administered 2013-03-17 (×2): 0.1 mg via ORAL
  Filled 2013-03-17 (×2): qty 1

## 2013-03-17 MED ORDER — VANCOMYCIN HCL IN DEXTROSE 1-5 GM/200ML-% IV SOLN
INTRAVENOUS | Status: AC
  Filled 2013-03-17: qty 200

## 2013-03-17 MED ORDER — FUROSEMIDE 10 MG/ML IJ SOLN
20.0000 mg | Freq: Every day | INTRAMUSCULAR | Status: DC
  Administered 2013-03-17 – 2013-03-19 (×3): 20 mg via INTRAVENOUS
  Filled 2013-03-17 (×3): qty 2

## 2013-03-17 MED ORDER — POTASSIUM CHLORIDE CRYS ER 20 MEQ PO TBCR
20.0000 meq | EXTENDED_RELEASE_TABLET | Freq: Two times a day (BID) | ORAL | Status: DC
  Administered 2013-03-17 – 2013-03-20 (×8): 20 meq via ORAL
  Filled 2013-03-17 (×8): qty 1

## 2013-03-17 MED ORDER — SODIUM CHLORIDE 0.9 % IV SOLN
INTRAVENOUS | Status: DC
  Administered 2013-03-17: 15:00:00 via INTRAVENOUS

## 2013-03-17 NOTE — Progress Notes (Signed)
Physical Therapy Treatment Patient Details Name: Ann [name] MRN: [id number] DOB: Sep 12, 1945 Today's Date: 03/17/2013 Time: 4098-1191 PT Time Calculation (min): 25 min 1 GT 1 TA  PT Assessment / Plan / Recommendation       Patient feeling "swimmy headed" during gait around room with RW. O2sats 3L 91-92%, upon sitting returned to 98%. Completed total of 20' (10' + 10' use of BSC in between) supervision-no LOB. Therex completed in recliner                                     Progress towards PT Goals Progress towards PT goals: Progressing toward goals  Plan      Precautions / Restrictions         Mobility  Transfers Transfers: Sit to Stand;Stand to Sit Sit to Stand: 5: Supervision;From toilet;From chair/3-in-1 Stand to Sit: 5: Supervision;To chair/3-in-1;To toilet Details for Transfer Assistance: supervision due to dizziness Ambulation/Gait Ambulation/Gait Assistance: 5: Supervision Ambulation Distance (Feet): 20 Feet (10' +10') Assistive device: Rolling walker Gait Pattern: Shuffle Gait velocity: very slow    Exercises General Exercises - Lower Extremity Ankle Circles/Pumps: AROM;10 reps;Both;Seated Long Arc Quad: AROM;Both;10 reps;Seated Heel Slides: AROM;Both;10 reps;Seated   PT Diagnosis:    PT Problem List:   PT Treatment Interventions:     PT Goals (current goals can now be found in the care plan section)    Visit Information  Last PT Received On: 03/17/13 Assistance Needed: +1    Subjective Data      Cognition       Balance     End of Session     GP     Ann [name] 03/17/2013, 2:03 PM

## 2013-03-17 NOTE — Progress Notes (Signed)
Dr Juanetta Gosling called and updated on recent blood pressure measurements for this patient, following administration of new blood pressure medications (see MAR).  Verbal order given to transfer patient to telemetry bed.  Nicole Cella, RN

## 2013-03-17 NOTE — Plan of Care (Signed)
Problem: Phase II Progression Outcomes Goal: IV converted to [facility] or NSL Outcome: Completed/Met Date Met:  03/17/13 kvo

## 2013-03-17 NOTE — Progress Notes (Signed)
Inpatient Diabetes Program Recommendations  AACE/ADA: New Consensus Statement on Inpatient Glycemic Control (2013)  Target Ranges:  Prepandial:   less than 140 mg/dL      Peak postprandial:   less than 180 mg/dL (1-2 hours)      Critically ill patients:  140 - 180 mg/dL    Results for ALAA, MULLALLY (MRN [id number]) as of 03/17/2013 09:41  Ref. Range 03/15/2013 18:02 03/15/2013 21:18 03/16/2013 07:27 03/16/2013 16:29 03/16/2013 21:16 03/17/2013 07:52  Glucose-Capillary Latest Range: 70-99 mg/dL 914 (H) 782 (H) 956 (H) 278 (H) 258 (H) 192 (H)   Inpatient Diabetes Program Recommendations Insulin - Basal: Please consider increasing Levemir to 57 units QHS. Correction (SSI): Please consider adding Novolog bedtime correction. Insulin - Meal Coverage: Please consider ordering Novolog 3 units TID with meals while on steroids.  Note: Patient has a history of diabetes and takes Levemir 55 units QHS, Novolog 0-20 AC, Novolog 0-5 HS, and Novolog 3 units TID with meals as an outpatient for diabetes management.  Currently, patient is ordered to receive Levemir 55 units QHS and Novolog 0-20 units AC for inpatient glycemic control.  Patient is ordered to received Solucortef 100 mg Q8H which is contributing to elevated glucose.  Please consider increasing Levemir to 57 units QHS, adding Novolog bedtime correction, and adding Novolog 3 units TID with meals for meal coverage while in steroids.  Will continue to follow.  Thanks, Orlando Penner, RN, MSN, CCRN Diabetes Coordinator Inpatient Diabetes Program [phone] (Team Pager) [phone] ([facility] office) [phone] [facility] office)

## 2013-03-17 NOTE — Progress Notes (Signed)
Subjective: She was admitted with sepsis and was markedly hypotensive. She was also hypokalemic on admission. This morning her blood pressure is high. She says she feels okay. She has no complaints of pain. She still complains of weakness which was her major complaint on admission. She says she's urinating a lot. She is receiving IV fluids at 100 and received several boluses when she was initially admitted. Her urine is growing gram-negative rods but we do not have a sensitivity or identification of the organism  Objective: Vital signs in last 24 hours: Temp:  [97.8 F (36.6 C)-98.4 F (36.9 C)] 97.8 F (36.6 C) (10/22 0400) Pulse Rate:  [52-96] 96 (10/22 0700) Resp:  [11-28] 27 (10/22 0700) BP: (153-214)/(73-125) 213/125 mmHg (10/22 0700) SpO2:  [88 %-99 %] 89 % (10/22 0729) Weight:  [106.1 kg (233 lb 14.5 oz)] 106.1 kg (233 lb 14.5 oz) (10/22 0500) Weight change: 7.307 kg (16 lb 1.7 oz) Last BM Date: 03/16/13  Intake/Output from previous day: 10/21 0701 - 10/22 0700 In: 3190 [P.O.:840; I.V.:1800; IV Piggyback:550] Out: 825 [Urine:825]  PHYSICAL EXAM General appearance: alert, cooperative and morbidly obese Resp: clear to auscultation bilaterally Cardio: regular rate and rhythm, S1, S2 normal, no murmur, click, rub or gallop GI: soft, non-tender; bowel sounds normal; no masses,  no organomegaly Extremities: extremities normal, atraumatic, no cyanosis or edema  Lab Results:    Basic Metabolic Panel:  Recent Labs  16/10/96 1719 03/16/13 0519 03/17/13 0412  NA  --  137 139  K  --  3.5 3.4*  CL  --  98 99  CO2  --  31 29  GLUCOSE  --  221* 224*  BUN  --  12 13  CREATININE  --  0.81 0.70  CALCIUM  --  9.0 9.2  MG 1.6 2.7*  --    Liver Function Tests:  Recent Labs  03/15/13 1433 03/16/13 0519  AST 53* 70*  ALT 50* 62*  ALKPHOS 101 106  BILITOT 0.4 0.9  PROT 6.3 6.5  ALBUMIN 3.1* 3.1*   No results found for this basename: LIPASE, AMYLASE,  in the last 72  hours No results found for this basename: AMMONIA,  in the last 72 hours CBC:  Recent Labs  03/15/13 1433  03/16/13 0519 03/17/13 0412  WBC 11.0*  --  7.7 13.3*  NEUTROABS 7.5  --   --  11.4*  HGB 12.0  < > 11.9* 12.4  HCT 37.5  < > 37.3 38.7  MCV 91.7  --  89.9 89.4  PLT 198  --  183 209  < > = values in this interval not displayed. Cardiac Enzymes:  Recent Labs  03/15/13 1814 03/16/13 0012 03/16/13 0519  TROPONINI <0.30 <0.30 <0.30   BNP: No results found for this basename: PROBNP,  in the last 72 hours D-Dimer: No results found for this basename: DDIMER,  in the last 72 hours CBG:  Recent Labs  03/15/13 1802 03/15/13 2118 03/16/13 0727 03/16/13 1629 03/16/13 2116  GLUCAP 148* 227* 201* 278* 258*   Hemoglobin A1C:  Recent Labs  03/15/13 1814  HGBA1C 6.7*   Fasting Lipid Panel: No results found for this basename: CHOL, HDL, LDLCALC, TRIG, CHOLHDL, LDLDIRECT,  in the last 72 hours Thyroid Function Tests:  Recent Labs  03/16/13 0519  TSH 0.835   Anemia Panel: No results found for this basename: VITAMINB12, FOLATE, FERRITIN, TIBC, IRON, RETICCTPCT,  in the last 72 hours Coagulation: No results found for this basename: LABPROT,  INR,  in the last 72 hours Urine Drug Screen: Drugs of Abuse  No results found for this basename: labopia, cocainscrnur, labbenz, amphetmu, thcu, labbarb    Alcohol Level: No results found for this basename: ETH,  in the last 72 hours Urinalysis:  Recent Labs  03/15/13 1504  COLORURINE YELLOW  LABSPEC 1.015  PHURINE 6.5  GLUCOSEU NEGATIVE  HGBUR NEGATIVE  BILIRUBINUR NEGATIVE  KETONESUR NEGATIVE  PROTEINUR TRACE*  UROBILINOGEN 0.2  NITRITE NEGATIVE  LEUKOCYTESUR SMALL*   Misc. Labs:  ABGS  Recent Labs  03/15/13 1520  PHART 7.399  PO2ART 78.2*  TCO2 25.8  HCO3 29.0*   CULTURES Recent Results (from the past 240 hour(s))  CULTURE, BLOOD (ROUTINE X 2)     Status: None   Collection Time    03/15/13   2:31 PM      Result Value Range Status   Specimen Description BLOOD PORTA CATH DRAWN BY RN   Final   Special Requests BOTTLES DRAWN AEROBIC AND ANAEROBIC 6CC   Final   Culture NO GROWTH 1 DAY   Final   Report Status PENDING   Incomplete  CULTURE, BLOOD (ROUTINE X 2)     Status: None   Collection Time    03/15/13  2:35 PM      Result Value Range Status   Specimen Description BLOOD LEFT ANTECUBITAL DRAWN BY RN   Final   Special Requests BOTTLES DRAWN AEROBIC ONLY 5CC   Final   Culture NO GROWTH 1 DAY   Final   Report Status PENDING   Incomplete  URINE CULTURE     Status: None   Collection Time    03/15/13  3:04 PM      Result Value Range Status   Specimen Description URINE, CLEAN CATCH   Final   Special Requests NONE   Final   Culture  Setup Time     Final   Value: 03/16/2013 00:28     Performed at [facility] Count     Final   Value: >=100,000 COLONIES/ML     Performed at [facility]   Culture     Final   Value: GRAM NEGATIVE RODS     Performed at [facility]   Report Status PENDING   Incomplete  MRSA PCR SCREENING     Status: Abnormal   Collection Time    03/15/13  5:56 PM      Result Value Range Status   MRSA by PCR POSITIVE (*) NEGATIVE Final   Comment:            The GeneXpert MRSA Assay (FDA     approved for NASAL specimens     only), is one component of a     comprehensive MRSA colonization     surveillance program. It is not     intended to diagnose MRSA     infection nor to guide or     monitor treatment for     MRSA infections.     RESULT CALLED TO, READ BACK BY AND VERIFIED WITH:     STONE A AT 2030 ON 102014 BY FORSYTH K   Studies/Results: Dg Chest Port 1 View  03/15/2013   CLINICAL DATA:  Lobe blood pressure.  EXAM: PORTABLE CHEST - 1 VIEW  COMPARISON:  10/14/2012.  FINDINGS: Left central line tip proximal superior vena cava level.  Cardiomegaly.  Central pulmonary vascular prominence.  Slightly limited evaluation left  lung base secondary  to cardiomegaly and technique. No segmental consolidation noted.  No gross pneumothorax.  Prior cervical spine surgery.  Calcified slightly tortuous aorta.  IMPRESSION: Left central line tip proximal superior vena cava level.  Cardiomegaly.  Central pulmonary vascular prominence.  Slightly limited evaluation left lung base secondary to cardiomegaly and technique. No segmental consolidation noted.  Calcified slightly tortuous aorta.   Electronically Signed   By: Bridgett Larsson M.D.   On: 03/15/2013 14:55    Medications:  Prior to Admission:  Prescriptions prior to admission  Medication Sig Dispense Refill  . albuterol (PROVENTIL HFA;VENTOLIN HFA) 108 (90 BASE) MCG/ACT inhaler Inhale 2 puffs into the lungs every 4 (four) hours as needed for wheezing.      Marland Kitchen ALPRAZolam (XANAX) 0.5 MG tablet Take 1 tablet (0.5 mg total) by mouth 3 (three) times daily as needed for sleep.    0  . aspirin EC 81 MG tablet Take 81 mg by mouth daily.      . carvedilol (COREG) 6.25 MG tablet Take 6.25 mg by mouth 2 (two) times daily with a meal.      . cloNIDine (CATAPRES) 0.1 MG tablet Take 0.1 mg by mouth 2 (two) times daily.      . digoxin (LANOXIN) 0.125 MG tablet Take 1 tablet (0.125 mg total) by mouth daily.      . fluticasone (FLONASE) 50 MCG/ACT nasal spray Place 2 sprays into the nose daily.    2  . furosemide (LASIX) 40 MG tablet Take 40 mg by mouth daily.      Marland Kitchen gabapentin (NEURONTIN) 100 MG capsule Take 1 capsule (100 mg total) by mouth 3 (three) times daily.      . hydroxypropyl methylcellulose (ISOPTO TEARS) 2.5 % ophthalmic solution Place 1 drop into both eyes 3 (three) times daily as needed (dry eyes).      . insulin aspart (NOVOLOG) 100 UNIT/ML injection Inject 4-10 Units into the skin 3 (three) times daily with meals. If blood sugar 200-250= 4 units; 251-300= 6 units; 301-350=8 units; 351-400= 10 units; >400= call MD      . insulin detemir (LEVEMIR) 100 UNIT/ML injection Inject 15 Units  into the skin at bedtime.      Marland Kitchen ipratropium-albuterol (DUONEB) 0.5-2.5 (3) MG/3ML SOLN Take 3 mLs by nebulization every 6 (six) hours as needed (wheezing).      Marland Kitchen levothyroxine (SYNTHROID, LEVOTHROID) 200 MCG tablet Take 1 tablet (200 mcg total) by mouth daily before breakfast.      . metoCLOPramide (REGLAN) 10 MG tablet Take 1 tablet (10 mg total) by mouth 4 (four) times daily -  before meals and at bedtime.      Marland Kitchen oxycodone (OXY-IR) 5 MG capsule Take 5 mg by mouth 3 (three) times daily as needed for pain.      . predniSONE (DELTASONE) 10 MG tablet Take 10 mg by mouth daily.      . sertraline (ZOLOFT) 50 MG tablet Take 50 mg by mouth at bedtime.      . tolterodine (DETROL) 2 MG tablet Take 2 mg by mouth 2 (two) times daily.      . vitamin B-12 1000 MCG tablet Take 1 tablet (1,000 mcg total) by mouth daily.      . Vitamins A & D (VITAMIN A & D) ointment Apply 1 application topically as needed for dry skin.      Marland Kitchen nitroGLYCERIN (NITROSTAT) 0.4 MG SL tablet Place 1 tablet (0.4 mg total) under the tongue every 5 (five)  minutes as needed for chest pain.    12   Scheduled: . albuterol  2.5 mg Nebulization Q4H  . aspirin  81 mg Oral Daily  . atorvastatin  80 mg Oral q1800  . carvedilol  6.25 mg Oral BID WC  . Chlorhexidine Gluconate Cloth  6 each Topical Q0600  . cloNIDine  0.1 mg Oral BID  . digoxin  0.125 mg Oral Daily  . fluticasone  2 spray Each Nare Daily  . gabapentin  100 mg Oral TID  . heparin  5,000 Units Subcutaneous Q8H  . hydrocortisone sod succinate (SOLU-CORTEF) inj  100 mg Intravenous Q8H  . insulin aspart  0-20 Units Subcutaneous TID WC  . insulin detemir  55 Units Subcutaneous QHS  . ipratropium  500 mcg Nebulization QID  . levothyroxine  200 mcg Oral QAC breakfast  . metoCLOPramide  10 mg Oral TID AC & HS  . mupirocin ointment  1 application Nasal BID  . piperacillin-tazobactam (ZOSYN)  IV  3.375 g Intravenous Q8H  . sertraline  50 mg Oral QHS  . sodium chloride  3 mL  Intravenous Q12H  . vancomycin  1,000 mg Intravenous Q12H  . vitamin B-12  1,000 mcg Oral Daily   Continuous: . sodium chloride 0.9 % 1,000 mL with potassium chloride 40 mEq infusion 100 mL/hr at 03/17/13 0500   ZOX:[id number], acetaminophen, albuterol, albuterol, ALPRAZolam, HYDROcodone-acetaminophen, ipratropium, nitroGLYCERIN, ondansetron (ZOFRAN) IV, ondansetron, oxycodone, polyvinyl alcohol  Assesment: She was admitted with sepsis. She has multiple medical problems. She does have some problem with congestive heart failure so since she's better and her blood pressure is actually up I'm going to reduce her IV fluids. She will stay on current antibiotics. I think she's probably going to need some Lasix now. She will continue everything else. Try to avoid Foley catheter. She is to stay in the unit at least right now Principal Problem:   Sepsis Active Problems:   HYPOTHYROIDISM   HYPERLIPIDEMIA   HYPERTENSION   CAD   GASTROESOPHAGEAL REFLUX DISEASE   RENAL FAILURE, CHRONIC   Diabetes mellitus type 2 with complications   Chronic diastolic heart failure   Adrenal insufficiency   UTI (lower urinary tract infection)   Hypokalemia   Morbid obesity   Atrial fibrillation   Hypotension   Elevated troponin    Plan: Add clonidine , Lasix and low dose reduce her IV fluids continue all the other medicines    LOS: 2 days   Micah Galeno L 03/17/2013, 8:07 AM

## 2013-03-17 NOTE — Progress Notes (Signed)
*  PRELIMINARY RESULTS* Echocardiogram 2D Echocardiogram has been performed.  Ann [name] 03/17/2013, 10:19 AM

## 2013-03-17 NOTE — Progress Notes (Signed)
Pt appears to be resting comfortably, vss, no c/o pain, low bed, HOB>30 deg, cal bell at pt's side, will cont to monitor

## 2013-03-18 DIAGNOSIS — A419 Sepsis, unspecified organism: Secondary | ICD-10-CM | POA: Diagnosis not present

## 2013-03-18 DIAGNOSIS — J449 Chronic obstructive pulmonary disease, unspecified: Secondary | ICD-10-CM | POA: Diagnosis not present

## 2013-03-18 DIAGNOSIS — N390 Urinary tract infection, site not specified: Secondary | ICD-10-CM | POA: Diagnosis not present

## 2013-03-18 LAB — GLUCOSE, CAPILLARY: Glucose-Capillary: 254 mg/dL — ABNORMAL HIGH (ref 70–99)

## 2013-03-18 LAB — CBC WITH DIFFERENTIAL/PLATELET
Basophils Absolute: 0 10*3/uL (ref 0.0–0.1)
Basophils Relative: 0 % (ref 0–1)
Hemoglobin: 11.6 g/dL — ABNORMAL LOW (ref 12.0–15.0)
Lymphocytes Relative: 11 % — ABNORMAL LOW (ref 12–46)
MCHC: 32 g/dL (ref 30.0–36.0)
Neutro Abs: 7.1 10*3/uL (ref 1.7–7.7)
Neutrophils Relative %: 82 % — ABNORMAL HIGH (ref 43–77)
RDW: 15.8 % — ABNORMAL HIGH (ref 11.5–15.5)
WBC: 8.7 10*3/uL (ref 4.0–10.5)

## 2013-03-18 LAB — BASIC METABOLIC PANEL
BUN: 19 mg/dL (ref 6–23)
Creatinine, Ser: 0.65 mg/dL (ref 0.50–1.10)
GFR calc Af Amer: >90 mL/min (ref >90)
GFR calc non Af Amer: 90 mL/min — ABNORMAL LOW (ref >90)
Potassium: 3.6 mEq/L (ref 3.5–5.1)

## 2013-03-18 MED ORDER — PREDNISONE 10 MG PO TABS
10.0000 mg | ORAL_TABLET | Freq: Every day | ORAL | Status: DC
  Administered 2013-03-19 – 2013-03-21 (×3): 10 mg via ORAL
  Filled 2013-03-18 (×3): qty 1

## 2013-03-18 MED ORDER — CLONIDINE HCL 0.2 MG PO TABS
0.2000 mg | ORAL_TABLET | Freq: Three times a day (TID) | ORAL | Status: DC
  Administered 2013-03-18 (×3): 0.2 mg via ORAL
  Filled 2013-03-18 (×4): qty 1

## 2013-03-18 MED ORDER — CARVEDILOL 12.5 MG PO TABS
12.5000 mg | ORAL_TABLET | Freq: Two times a day (BID) | ORAL | Status: DC
  Administered 2013-03-18 (×2): 12.5 mg via ORAL
  Filled 2013-03-18 (×3): qty 1

## 2013-03-18 MED ORDER — AMLODIPINE BESYLATE 5 MG PO TABS
5.0000 mg | ORAL_TABLET | ORAL | Status: AC
  Administered 2013-03-18: 5 mg via ORAL
  Filled 2013-03-18: qty 1

## 2013-03-18 NOTE — Progress Notes (Signed)
Patient transferred to 300 floor on 3L O2 . Report given to Clydie Braun. Tolerated well.

## 2013-03-18 NOTE — Progress Notes (Signed)
Subjective: She feels okay. She's had problems with blood pressure being too high. Everything else is about the same.  Objective: Vital signs in last 24 hours: Temp:  [97.7 F (36.5 C)-98.5 F (36.9 C)] 97.7 F (36.5 C) (10/23 0800) Pulse Rate:  [49-92] 81 (10/23 0800) Resp:  [15-32] 17 (10/23 0800) BP: (126-201)/(81-138) 192/122 mmHg (10/23 0800) SpO2:  [92 %-97 %] 95 % (10/23 0800) Weight:  [106.5 kg (234 lb 12.6 oz)] 106.5 kg (234 lb 12.6 oz) (10/23 0500) Weight change: 0.4 kg (14.1 oz) Last BM Date: 03/17/13  Intake/Output from previous day: 10/22 0701 - 10/23 0700 In: 1279.3 [P.O.:240; I.V.:469.3; IV Piggyback:550] Out: 1440 [Urine:1440]  PHYSICAL EXAM General appearance: alert, cooperative, no distress and morbidly obese Resp: clear to auscultation bilaterally Cardio: regular rate and rhythm, S1, S2 normal, no murmur, click, rub or gallop GI: soft, non-tender; bowel sounds normal; no masses,  no organomegaly Extremities: extremities normal, atraumatic, no cyanosis or edema  Lab Results:    Basic Metabolic Panel:  Recent Labs  45/40/98 1719 03/16/13 0519 03/17/13 0412 03/18/13 0435  NA  --  137 139 139  K  --  3.5 3.4* 3.6  CL  --  98 99 99  CO2  --  31 29 32  GLUCOSE  --  221* 224* 213*  BUN  --  12 13 19   CREATININE  --  0.81 0.70 0.65  CALCIUM  --  9.0 9.2 9.1  MG 1.6 2.7*  --   --    Liver Function Tests:  Recent Labs  03/15/13 1433 03/16/13 0519  AST 53* 70*  ALT 50* 62*  ALKPHOS 101 106  BILITOT 0.4 0.9  PROT 6.3 6.5  ALBUMIN 3.1* 3.1*   No results found for this basename: LIPASE, AMYLASE,  in the last 72 hours No results found for this basename: AMMONIA,  in the last 72 hours CBC:  Recent Labs  03/17/13 0412 03/18/13 0435  WBC 13.3* 8.7  NEUTROABS 11.4* 7.1  HGB 12.4 11.6*  HCT 38.7 36.2  MCV 89.4 89.4  PLT 209 180   Cardiac Enzymes:  Recent Labs  03/15/13 1814 03/16/13 0012 03/16/13 0519  TROPONINI <0.30 <0.30 <0.30    BNP: No results found for this basename: PROBNP,  in the last 72 hours D-Dimer: No results found for this basename: DDIMER,  in the last 72 hours CBG:  Recent Labs  03/16/13 2116 03/17/13 0752 03/17/13 1122 03/17/13 1611 03/17/13 2157 03/18/13 0731  GLUCAP 258* 192* 207* 261* 218* 209*   Hemoglobin A1C:  Recent Labs  03/15/13 1814  HGBA1C 6.7*   Fasting Lipid Panel: No results found for this basename: CHOL, HDL, LDLCALC, TRIG, CHOLHDL, LDLDIRECT,  in the last 72 hours Thyroid Function Tests:  Recent Labs  03/16/13 0519  TSH 0.835   Anemia Panel: No results found for this basename: VITAMINB12, FOLATE, FERRITIN, TIBC, IRON, RETICCTPCT,  in the last 72 hours Coagulation: No results found for this basename: LABPROT, INR,  in the last 72 hours Urine Drug Screen: Drugs of Abuse  No results found for this basename: labopia, cocainscrnur, labbenz, amphetmu, thcu, labbarb    Alcohol Level: No results found for this basename: ETH,  in the last 72 hours Urinalysis:  Recent Labs  03/15/13 1504  COLORURINE YELLOW  LABSPEC 1.015  PHURINE 6.5  GLUCOSEU NEGATIVE  HGBUR NEGATIVE  BILIRUBINUR NEGATIVE  KETONESUR NEGATIVE  PROTEINUR TRACE*  UROBILINOGEN 0.2  NITRITE NEGATIVE  LEUKOCYTESUR SMALL*   Misc. Labs:  ABGS  Recent Labs  03/15/13 1520  PHART 7.399  PO2ART 78.2*  TCO2 25.8  HCO3 29.0*   CULTURES Recent Results (from the past 240 hour(s))  CULTURE, BLOOD (ROUTINE X 2)     Status: None   Collection Time    03/15/13  2:31 PM      Result Value Range Status   Specimen Description BLOOD PORTA CATH DRAWN BY RN   Final   Special Requests BOTTLES DRAWN AEROBIC AND ANAEROBIC 6CC   Final   Culture NO GROWTH 1 DAY   Final   Report Status PENDING   Incomplete  CULTURE, BLOOD (ROUTINE X 2)     Status: None   Collection Time    03/15/13  2:35 PM      Result Value Range Status   Specimen Description BLOOD LEFT ANTECUBITAL DRAWN BY RN   Final   Special  Requests BOTTLES DRAWN AEROBIC ONLY 5CC   Final   Culture NO GROWTH 1 DAY   Final   Report Status PENDING   Incomplete  URINE CULTURE     Status: None   Collection Time    03/15/13  3:04 PM      Result Value Range Status   Specimen Description URINE, CLEAN CATCH   Final   Special Requests NONE   Final   Culture  Setup Time     Final   Value: 03/16/2013 00:28     Performed at [facility] Count     Final   Value: >=100,000 COLONIES/ML     Performed at [facility]   Culture     Final   Value: KLEBSIELLA PNEUMONIAE     Performed at [facility]   Report Status 03/17/2013 FINAL   Final   Organism ID, Bacteria KLEBSIELLA PNEUMONIAE   Final  MRSA PCR SCREENING     Status: Abnormal   Collection Time    03/15/13  5:56 PM      Result Value Range Status   MRSA by PCR POSITIVE (*) NEGATIVE Final   Comment:            The GeneXpert MRSA Assay (FDA     approved for NASAL specimens     only), is one component of a     comprehensive MRSA colonization     surveillance program. It is not     intended to diagnose MRSA     infection nor to guide or     monitor treatment for     MRSA infections.     RESULT CALLED TO, READ BACK BY AND VERIFIED WITH:     STONE A AT 2030 ON 102014 BY FORSYTH K  URINE CULTURE     Status: None   Collection Time    03/16/13  5:30 AM      Result Value Range Status   Specimen Description URINE, CATHETERIZED   Final   Special Requests NONE   Final   Culture  Setup Time     Final   Value: 03/16/2013 10:30     Performed at [facility] Count     Final   Value: NO GROWTH     Performed at [facility]   Culture     Final   Value: NO GROWTH     Performed at [facility]   Report Status 03/17/2013 FINAL   Final   Studies/Results: No results found.  Medications:  Prior  to Admission:  Prescriptions prior to admission  Medication Sig Dispense Refill  . albuterol (PROVENTIL HFA;VENTOLIN HFA) 108  (90 BASE) MCG/ACT inhaler Inhale 2 puffs into the lungs every 4 (four) hours as needed for wheezing.      Marland Kitchen ALPRAZolam (XANAX) 0.5 MG tablet Take 1 tablet (0.5 mg total) by mouth 3 (three) times daily as needed for sleep.    0  . aspirin EC 81 MG tablet Take 81 mg by mouth daily.      . carvedilol (COREG) 6.25 MG tablet Take 6.25 mg by mouth 2 (two) times daily with a meal.      . cloNIDine (CATAPRES) 0.1 MG tablet Take 0.1 mg by mouth 2 (two) times daily.      . digoxin (LANOXIN) 0.125 MG tablet Take 1 tablet (0.125 mg total) by mouth daily.      . fluticasone (FLONASE) 50 MCG/ACT nasal spray Place 2 sprays into the nose daily.    2  . furosemide (LASIX) 40 MG tablet Take 40 mg by mouth daily.      Marland Kitchen gabapentin (NEURONTIN) 100 MG capsule Take 1 capsule (100 mg total) by mouth 3 (three) times daily.      . hydroxypropyl methylcellulose (ISOPTO TEARS) 2.5 % ophthalmic solution Place 1 drop into both eyes 3 (three) times daily as needed (dry eyes).      . insulin aspart (NOVOLOG) 100 UNIT/ML injection Inject 4-10 Units into the skin 3 (three) times daily with meals. If blood sugar 200-250= 4 units; 251-300= 6 units; 301-350=8 units; 351-400= 10 units; >400= call MD      . insulin detemir (LEVEMIR) 100 UNIT/ML injection Inject 15 Units into the skin at bedtime.      Marland Kitchen ipratropium-albuterol (DUONEB) 0.5-2.5 (3) MG/3ML SOLN Take 3 mLs by nebulization every 6 (six) hours as needed (wheezing).      Marland Kitchen levothyroxine (SYNTHROID, LEVOTHROID) 200 MCG tablet Take 1 tablet (200 mcg total) by mouth daily before breakfast.      . metoCLOPramide (REGLAN) 10 MG tablet Take 1 tablet (10 mg total) by mouth 4 (four) times daily -  before meals and at bedtime.      Marland Kitchen oxycodone (OXY-IR) 5 MG capsule Take 5 mg by mouth 3 (three) times daily as needed for pain.      . predniSONE (DELTASONE) 10 MG tablet Take 10 mg by mouth daily.      . sertraline (ZOLOFT) 50 MG tablet Take 50 mg by mouth at bedtime.      . tolterodine  (DETROL) 2 MG tablet Take 2 mg by mouth 2 (two) times daily.      . vitamin B-12 1000 MCG tablet Take 1 tablet (1,000 mcg total) by mouth daily.      . Vitamins A & D (VITAMIN A & D) ointment Apply 1 application topically as needed for dry skin.      Marland Kitchen nitroGLYCERIN (NITROSTAT) 0.4 MG SL tablet Place 1 tablet (0.4 mg total) under the tongue every 5 (five) minutes as needed for chest pain.    12   Scheduled: . albuterol  2.5 mg Nebulization Q4H  . aspirin  81 mg Oral Daily  . atorvastatin  80 mg Oral q1800  . carvedilol  12.5 mg Oral BID WC  . Chlorhexidine Gluconate Cloth  6 each Topical Q0600  . cloNIDine  0.2 mg Oral TID  . digoxin  0.125 mg Oral Daily  . fluticasone  2 spray Each Nare Daily  .  furosemide  20 mg Intravenous Daily  . gabapentin  100 mg Oral TID  . heparin  5,000 Units Subcutaneous Q8H  . hydrocortisone sod succinate (SOLU-CORTEF) inj  100 mg Intravenous Q8H  . insulin aspart  0-20 Units Subcutaneous TID WC  . insulin detemir  55 Units Subcutaneous QHS  . ipratropium  500 mcg Nebulization QID  . levothyroxine  200 mcg Oral QAC breakfast  . metoCLOPramide  10 mg Oral TID AC & HS  . mupirocin ointment  1 application Nasal BID  . piperacillin-tazobactam (ZOSYN)  IV  3.375 g Intravenous Q8H  . potassium chloride  20 mEq Oral BID  . sertraline  50 mg Oral QHS  . sodium chloride  3 mL Intravenous Q12H  . vancomycin  1,000 mg Intravenous Q12H  . vitamin B-12  1,000 mcg Oral Daily   Continuous: . sodium chloride 20 mL/hr at 03/18/13 0806   NWG:[id number], acetaminophen, albuterol, albuterol, ALPRAZolam, HYDROcodone-acetaminophen, ipratropium, nitroGLYCERIN, ondansetron (ZOFRAN) IV, ondansetron, oxyCODONE, polyvinyl alcohol  Assesment: She is much better as far as her sepsis is concerned. She is growing Klebsiella in her urine and that is the only positive culture we have. Her blood pressure is up and some of that may be from the Solu-Cortef on going to switch her back  to prednisone. She is hypertensive and I've increased her medications. She has adrenal insufficiency see above she has morbid obesity which is much improved. She has coronary artery occlusive disease but is not symptomatic at this point. She has had problems with congestive heart failure but has no overt signs or symptoms now. She has diabetes and it should be better controlled I get her off the IV steroids Principal Problem:   Sepsis Active Problems:   HYPOTHYROIDISM   HYPERLIPIDEMIA   HYPERTENSION   CAD   GASTROESOPHAGEAL REFLUX DISEASE   RENAL FAILURE, CHRONIC   Diabetes mellitus type 2 with complications   Chronic diastolic heart failure   Adrenal insufficiency   UTI (lower urinary tract infection)   Hypokalemia   Morbid obesity   Atrial fibrillation   Hypotension   Elevated troponin    Plan: Transfer out of the ICU discontinue steroids IV and switch to by mouth I increased her antihypertensives    LOS: 3 days   Kean Gautreau L 03/18/2013, 8:43 AM

## 2013-03-18 NOTE — Progress Notes (Signed)
DR Juanetta Gosling CALLED CONCERNING CONTINUED ELEVATED BP AND DECREASED HR. ORDERS RECEIVED. IMMEDIATELY AFTER ORDERS BP DROPPED TO 118/63. PO NORVASC HELD.
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
## 2013-03-19 DIAGNOSIS — N189 Chronic kidney disease, unspecified: Secondary | ICD-10-CM

## 2013-03-19 DIAGNOSIS — I5032 Chronic diastolic (congestive) heart failure: Secondary | ICD-10-CM

## 2013-03-19 DIAGNOSIS — I472 Ventricular tachycardia, unspecified: Secondary | ICD-10-CM

## 2013-03-19 DIAGNOSIS — E118 Type 2 diabetes mellitus with unspecified complications: Secondary | ICD-10-CM

## 2013-03-19 DIAGNOSIS — E785 Hyperlipidemia, unspecified: Secondary | ICD-10-CM

## 2013-03-19 DIAGNOSIS — I4891 Unspecified atrial fibrillation: Secondary | ICD-10-CM

## 2013-03-19 DIAGNOSIS — A419 Sepsis, unspecified organism: Secondary | ICD-10-CM | POA: Diagnosis not present

## 2013-03-19 DIAGNOSIS — N390 Urinary tract infection, site not specified: Secondary | ICD-10-CM

## 2013-03-19 DIAGNOSIS — J449 Chronic obstructive pulmonary disease, unspecified: Secondary | ICD-10-CM | POA: Diagnosis not present

## 2013-03-19 DIAGNOSIS — R9431 Abnormal electrocardiogram [ECG] [EKG]: Secondary | ICD-10-CM

## 2013-03-19 DIAGNOSIS — E2749 Other adrenocortical insufficiency: Secondary | ICD-10-CM | POA: Diagnosis not present

## 2013-03-19 DIAGNOSIS — E039 Hypothyroidism, unspecified: Secondary | ICD-10-CM

## 2013-03-19 DIAGNOSIS — I2510 Atherosclerotic heart disease of native coronary artery without angina pectoris: Secondary | ICD-10-CM

## 2013-03-19 DIAGNOSIS — I10 Essential (primary) hypertension: Secondary | ICD-10-CM

## 2013-03-19 LAB — GLUCOSE, CAPILLARY
Glucose-Capillary: 86 mg/dL (ref 70–99)
Glucose-Capillary: 141 mg/dL — ABNORMAL HIGH (ref 70–99)
Glucose-Capillary: 123 mg/dL — ABNORMAL HIGH (ref 70–99)

## 2013-03-19 LAB — DIGOXIN LEVEL: Digoxin Level: 0.8 ng/mL (ref 0.8–2.0)

## 2013-03-19 MED ORDER — FUROSEMIDE 20 MG PO TABS
20.0000 mg | ORAL_TABLET | Freq: Every day | ORAL | Status: DC
  Administered 2013-03-19 – 2013-03-21 (×3): 20 mg via ORAL
  Filled 2013-03-19 (×3): qty 1

## 2013-03-19 MED ORDER — HYDRALAZINE HCL 25 MG PO TABS
50.0000 mg | ORAL_TABLET | Freq: Four times a day (QID) | ORAL | Status: DC
  Administered 2013-03-19 – 2013-03-22 (×11): 50 mg via ORAL
  Filled 2013-03-19 (×11): qty 2

## 2013-03-19 MED ORDER — HYDRALAZINE HCL 25 MG PO TABS
50.0000 mg | ORAL_TABLET | Freq: Three times a day (TID) | ORAL | Status: DC
  Filled 2013-03-19: qty 2

## 2013-03-19 MED ORDER — CARVEDILOL 3.125 MG PO TABS
6.2500 mg | ORAL_TABLET | Freq: Two times a day (BID) | ORAL | Status: DC
  Administered 2013-03-19 – 2013-03-21 (×6): 6.25 mg via ORAL
  Filled 2013-03-19 (×5): qty 2

## 2013-03-19 MED ORDER — AMLODIPINE BESYLATE 5 MG PO TABS
10.0000 mg | ORAL_TABLET | Freq: Every day | ORAL | Status: DC
  Administered 2013-03-19 – 2013-03-21 (×3): 10 mg via ORAL
  Filled 2013-03-19 (×3): qty 2
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
## 2013-03-19 NOTE — Progress Notes (Signed)
Subjective: She says she feels okay. She has been having more trouble with blood pressure again and her heart rate has been down in the 40s. She is asymptomatic with this but she is on a number of medications that could cause her to have trouble with her heart rate. Otherwise she feels fairly well.  Objective: Vital signs in last 24 hours: Temp:  [97.4 F (36.3 C)-97.8 F (36.6 C)] 97.6 F (36.4 C) (10/24 0517) Pulse Rate:  [42-65] 42 (10/24 0517) Resp:  [14-24] 20 (10/24 0517) BP: (118-195)/(63-112) 121/73 mmHg (10/24 0517) SpO2:  [95 %-98 %] 95 % (10/24 0719) Weight:  [105.189 kg (231 lb 14.4 oz)] 105.189 kg (231 lb 14.4 oz) (10/24 0414) Weight change: -1.311 kg (-2 lb 14.2 oz) Last BM Date: 03/18/13  Intake/Output from previous day: 10/23 0701 - 10/24 0700 In: 1420 [P.O.:1200; I.V.:120; IV Piggyback:100] Out: 550 [Urine:550]  PHYSICAL EXAM General appearance: alert, cooperative, no distress and morbidly obese Resp: clear to auscultation bilaterally Cardio: regular rate and rhythm, S1, S2 normal, no murmur, click, rub or gallop GI: soft, non-tender; bowel sounds normal; no masses,  no organomegaly Extremities: extremities normal, atraumatic, no cyanosis or edema  Lab Results:    Basic Metabolic Panel:  Recent Labs  40/98/11 0412 03/18/13 0435  NA 139 139  K 3.4* 3.6  CL 99 99  CO2 29 32  GLUCOSE 224* 213*  BUN 13 19  CREATININE 0.70 0.65  CALCIUM 9.2 9.1   Liver Function Tests: No results found for this basename: AST, ALT, ALKPHOS, BILITOT, PROT, ALBUMIN,  in the last 72 hours No results found for this basename: LIPASE, AMYLASE,  in the last 72 hours No results found for this basename: AMMONIA,  in the last 72 hours CBC:  Recent Labs  03/17/13 0412 03/18/13 0435  WBC 13.3* 8.7  NEUTROABS 11.4* 7.1  HGB 12.4 11.6*  HCT 38.7 36.2  MCV 89.4 89.4  PLT 209 180   Cardiac Enzymes: No results found for this basename: CKTOTAL, CKMB, CKMBINDEX, TROPONINI,  in  the last 72 hours BNP: No results found for this basename: PROBNP,  in the last 72 hours D-Dimer: No results found for this basename: DDIMER,  in the last 72 hours CBG:  Recent Labs  03/17/13 2157 03/18/13 0731 03/18/13 1152 03/18/13 1615 03/18/13 2120 03/19/13 0717  GLUCAP 218* 209* 254* 231* 205* 86   Hemoglobin A1C: No results found for this basename: HGBA1C,  in the last 72 hours Fasting Lipid Panel: No results found for this basename: CHOL, HDL, LDLCALC, TRIG, CHOLHDL, LDLDIRECT,  in the last 72 hours Thyroid Function Tests: No results found for this basename: TSH, T4TOTAL, FREET4, T3FREE, THYROIDAB,  in the last 72 hours Anemia Panel: No results found for this basename: VITAMINB12, FOLATE, FERRITIN, TIBC, IRON, RETICCTPCT,  in the last 72 hours Coagulation: No results found for this basename: LABPROT, INR,  in the last 72 hours Urine Drug Screen: Drugs of Abuse  No results found for this basename: labopia, cocainscrnur, labbenz, amphetmu, thcu, labbarb    Alcohol Level: No results found for this basename: ETH,  in the last 72 hours Urinalysis: No results found for this basename: COLORURINE, APPERANCEUR, LABSPEC, PHURINE, GLUCOSEU, HGBUR, BILIRUBINUR, KETONESUR, PROTEINUR, UROBILINOGEN, NITRITE, LEUKOCYTESUR,  in the last 72 hours Misc. Labs:  ABGS No results found for this basename: PHART, PCO2, PO2ART, TCO2, HCO3,  in the last 72 hours CULTURES Recent Results (from the past 240 hour(s))  CULTURE, BLOOD (ROUTINE X 2)  Status: None   Collection Time    03/15/13  2:31 PM      Result Value Range Status   Specimen Description BLOOD PORTA CATH DRAWN BY RN   Final   Special Requests BOTTLES DRAWN AEROBIC AND ANAEROBIC 6CC   Final   Culture NO GROWTH 3 DAYS   Final   Report Status PENDING   Incomplete  CULTURE, BLOOD (ROUTINE X 2)     Status: None   Collection Time    03/15/13  2:35 PM      Result Value Range Status   Specimen Description BLOOD LEFT ANTECUBITAL  DRAWN BY RN   Final   Special Requests BOTTLES DRAWN AEROBIC ONLY 5CC   Final   Culture NO GROWTH 3 DAYS   Final   Report Status PENDING   Incomplete  URINE CULTURE     Status: None   Collection Time    03/15/13  3:04 PM      Result Value Range Status   Specimen Description URINE, CLEAN CATCH   Final   Special Requests NONE   Final   Culture  Setup Time     Final   Value: 03/16/2013 00:28     Performed at [facility] Count     Final   Value: >=100,000 COLONIES/ML     Performed at [facility]   Culture     Final   Value: KLEBSIELLA PNEUMONIAE     Performed at [facility]   Report Status 03/17/2013 FINAL   Final   Organism ID, Bacteria KLEBSIELLA PNEUMONIAE   Final  MRSA PCR SCREENING     Status: Abnormal   Collection Time    03/15/13  5:56 PM      Result Value Range Status   MRSA by PCR POSITIVE (*) NEGATIVE Final   Comment:            The GeneXpert MRSA Assay (FDA     approved for NASAL specimens     only), is one component of a     comprehensive MRSA colonization     surveillance program. It is not     intended to diagnose MRSA     infection nor to guide or     monitor treatment for     MRSA infections.     RESULT CALLED TO, READ BACK BY AND VERIFIED WITH:     STONE A AT 2030 ON 102014 BY FORSYTH K  URINE CULTURE     Status: None   Collection Time    03/16/13  5:30 AM      Result Value Range Status   Specimen Description URINE, CATHETERIZED   Final   Special Requests NONE   Final   Culture  Setup Time     Final   Value: 03/16/2013 10:30     Performed at [facility] Count     Final   Value: NO GROWTH     Performed at [facility]   Culture     Final   Value: NO GROWTH     Performed at [facility]   Report Status 03/17/2013 FINAL   Final   Studies/Results: No results found.  Medications:  Prior to Admission:  Prescriptions prior to admission  Medication Sig Dispense Refill  . albuterol  (PROVENTIL HFA;VENTOLIN HFA) 108 (90 BASE) MCG/ACT inhaler Inhale 2 puffs into the lungs every 4 (four) hours as needed for wheezing.      Marland Kitchen  ALPRAZolam (XANAX) 0.5 MG tablet Take 1 tablet (0.5 mg total) by mouth 3 (three) times daily as needed for sleep.    0  . aspirin EC 81 MG tablet Take 81 mg by mouth daily.      . carvedilol (COREG) 6.25 MG tablet Take 6.25 mg by mouth 2 (two) times daily with a meal.      . cloNIDine (CATAPRES) 0.1 MG tablet Take 0.1 mg by mouth 2 (two) times daily.      . digoxin (LANOXIN) 0.125 MG tablet Take 1 tablet (0.125 mg total) by mouth daily.      . fluticasone (FLONASE) 50 MCG/ACT nasal spray Place 2 sprays into the nose daily.    2  . furosemide (LASIX) 40 MG tablet Take 40 mg by mouth daily.      Marland Kitchen gabapentin (NEURONTIN) 100 MG capsule Take 1 capsule (100 mg total) by mouth 3 (three) times daily.      . hydroxypropyl methylcellulose (ISOPTO TEARS) 2.5 % ophthalmic solution Place 1 drop into both eyes 3 (three) times daily as needed (dry eyes).      . insulin aspart (NOVOLOG) 100 UNIT/ML injection Inject 4-10 Units into the skin 3 (three) times daily with meals. If blood sugar 200-250= 4 units; 251-300= 6 units; 301-350=8 units; 351-400= 10 units; >400= call MD      . insulin detemir (LEVEMIR) 100 UNIT/ML injection Inject 15 Units into the skin at bedtime.      Marland Kitchen ipratropium-albuterol (DUONEB) 0.5-2.5 (3) MG/3ML SOLN Take 3 mLs by nebulization every 6 (six) hours as needed (wheezing).      Marland Kitchen levothyroxine (SYNTHROID, LEVOTHROID) 200 MCG tablet Take 1 tablet (200 mcg total) by mouth daily before breakfast.      . metoCLOPramide (REGLAN) 10 MG tablet Take 1 tablet (10 mg total) by mouth 4 (four) times daily -  before meals and at bedtime.      Marland Kitchen oxycodone (OXY-IR) 5 MG capsule Take 5 mg by mouth 3 (three) times daily as needed for pain.      . predniSONE (DELTASONE) 10 MG tablet Take 10 mg by mouth daily.      . sertraline (ZOLOFT) 50 MG tablet Take 50 mg by mouth at  bedtime.      . tolterodine (DETROL) 2 MG tablet Take 2 mg by mouth 2 (two) times daily.      . vitamin B-12 1000 MCG tablet Take 1 tablet (1,000 mcg total) by mouth daily.      . Vitamins A & D (VITAMIN A & D) ointment Apply 1 application topically as needed for dry skin.      Marland Kitchen nitroGLYCERIN (NITROSTAT) 0.4 MG SL tablet Place 1 tablet (0.4 mg total) under the tongue every 5 (five) minutes as needed for chest pain.    12   Scheduled: . albuterol  2.5 mg Nebulization Q4H  . amLODipine  10 mg Oral Daily  . aspirin  81 mg Oral Daily  . atorvastatin  80 mg Oral q1800  . carvedilol  6.25 mg Oral BID WC  . Chlorhexidine Gluconate Cloth  6 each Topical Q0600  . digoxin  0.125 mg Oral Daily  . fluticasone  2 spray Each Nare Daily  . furosemide  20 mg Intravenous Daily  . gabapentin  100 mg Oral TID  . heparin  5,000 Units Subcutaneous Q8H  . hydrALAZINE  50 mg Oral Q8H  . insulin aspart  0-20 Units Subcutaneous TID WC  . insulin detemir  55 Units Subcutaneous QHS  . ipratropium  500 mcg Nebulization QID  . levothyroxine  200 mcg Oral QAC breakfast  . metoCLOPramide  10 mg Oral TID AC & HS  . mupirocin ointment  1 application Nasal BID  . piperacillin-tazobactam (ZOSYN)  IV  3.375 g Intravenous Q8H  . potassium chloride  20 mEq Oral BID  . predniSONE  10 mg Oral Q breakfast  . sertraline  50 mg Oral QHS  . sodium chloride  3 mL Intravenous Q12H  . vancomycin  1,000 mg Intravenous Q12H  . vitamin B-12  1,000 mcg Oral Daily   Continuous: . sodium chloride 20 mL/hr at 03/18/13 1400   ZOX:[id number], acetaminophen, albuterol, albuterol, ALPRAZolam, HYDROcodone-acetaminophen, ipratropium, nitroGLYCERIN, ondansetron (ZOFRAN) IV, ondansetron, oxyCODONE, polyvinyl alcohol  Assesment: She was admitted with sepsis that I believe is from urinary tract infection due to Klebsiella. This has improved. She was fairly markedly hypotensive on admission but now is hypertensive. She's been having  bradycardia which is a new problem. She has diabetes which is fairly well controlled. She has a history of coronary artery occlusive disease but no chest pain. She has chronic diastolic heart failure and is on a beta blocker because of that. She has chronic adrenal insufficiency and that is stable on oral medications. She has chronic renal failure and that's why she has not been on an ACE inhibitor. Principal Problem:   Sepsis Active Problems:   HYPOTHYROIDISM   HYPERLIPIDEMIA   HYPERTENSION   CAD   GASTROESOPHAGEAL REFLUX DISEASE   RENAL FAILURE, CHRONIC   Diabetes mellitus type 2 with complications   Chronic diastolic heart failure   Adrenal insufficiency   UTI (lower urinary tract infection)   Hypokalemia   Morbid obesity   Atrial fibrillation   Hypotension   Elevated troponin    Plan: I changed her carvedilol back to 6.25 mg twice a day. Because clonidine can also cause some bradycardia I have stopped that and switched her to hydralazine 50 mg every 6 hours. She will have a digoxin level. I will ask for cardiology evaluation.    LOS: 4 days   Coden Franchi L 03/19/2013, 8:32 AM

## 2013-03-19 NOTE — Progress Notes (Signed)
Physical Therapy Treatment Patient Details Name: Ann [name] MRN: [id number] DOB: 1945/10/12 Today's Date: 03/19/2013 Time: 1191-4782 PT Time Calculation (min): 41 min  PT Assessment / Plan / Recommendation  History of Present Illness     PT Comments   Pt with improved bed mobility and increased distance/ improved gait mechanics today.  Supervision only with bed mobility, transfer training to [facility], and gait today with no LOB episodes.  Pt did complain of "swimmy headed" when back in room following gait, O2 sat 96% with 2L O2 assistance.  Pt left in chair with chair alarm set and call bell within reach.    Follow Up Recommendations        Does the patient have the potential to tolerate intense rehabilitation     Barriers to Discharge        Equipment Recommendations       Recommendations for Other Services    Frequency     Progress towards PT Goals Progress towards PT goals: Progressing toward goals  Plan      Precautions / Restrictions Restrictions Weight Bearing Restrictions: No    Mobility  Bed Mobility Bed Mobility: Left Sidelying to Sit Left Sidelying to Sit: 5: Supervision Details for Bed Mobility Assistance: min cueing for handplacement to assist with suipine to sit Transfers Transfers: Sit to Stand;Stand to Sit Sit to Stand: 5: Supervision;From toilet;From bed Stand to Sit: 5: Supervision;To chair/3-in-1;To toilet Details for Transfer Assistance: supervision due to dizziness Ambulation/Gait Ambulation/Gait Assistance: 5: Supervision Ambulation Distance (Feet): 60 Feet Assistive device: Rolling walker Gait Pattern: Decreased stride length Gait velocity: WNL    Exercises General Exercises - Lower Extremity Ankle Circles/Pumps: AROM;10 reps;Both;Seated Long Arc Quad: AROM;Both;10 reps;Seated   PT Diagnosis:    PT Problem List:   PT Treatment Interventions:     PT Goals (current goals can now be found in the care plan section)    Visit Information  Last PT Received On: 03/19/13    Subjective Data   Pt reported no pain today.  Feels "swimmy headed".   Cognition  Cognition Arousal/Alertness: Awake/alert Behavior During Therapy: WFL for tasks assessed/performed Overall Cognitive Status: Within Functional Limits for tasks assessed    Balance     End of Session PT - End of Session Equipment Utilized During Treatment: Gait belt Activity Tolerance: Patient tolerated treatment well Patient left: in chair;with call bell/phone within reach;with chair alarm set Nurse Communication: Mobility status   GP     Juel Burrow 03/19/2013, 9:13 AM

## 2013-03-19 NOTE — Consult Note (Signed)
The patient was seen and examined, and I agree with the assessment and plan as documented above, with modifications as noted below. Pt admitted with hypotension and urosepsis, and has received IV fluids and antimicrobial therapy. She has a h/o CAD, remote h/o atrial fibrillation (had been on anticoagulation in the past), chronic diastolic heart failure, and HTN. She has had CKD but is currently stage G1 (GFR 90 ml/min). She has a h/o adrenal insufficiency and was put on steroids and given IV fluids, and has been hypertensive.  However, she's also had episodes of bradycardia with HR's in the 40 bpm range, and currently in the 50 bpm range at the time of my evaluation. Her carvedilol was reduced to 6.25 mg bid and hydralazine was started (clonidine stopped due to causing bradycardia in <4% of patients). She is also on digoxin and does have normal LV systolic function. A digoxin level is 0.8 (low normal). Magnesium was slightly elevated at 2.7. Her ECG shows LVH with repolarization abnormalities. RECS: Given her normal LV systolic function, I would stop digoxin altogether, which should help improve her bradycardia. I agree with holding clonidine and using lower-dose carvedilol with the addition of hydralazine for BP management. Her potassium is now normal (2.2 on admission), and would aim to keep it > 4 to raise arrhythmic threshold. Again, she has a remote h/o atrial fibrillation and DVT, and she had been on Xarelto in the past. Her CHADS-Vasc score is 6 (out of 9), thus at high risk for a stroke. Thus, if no current bleeding issues, anticoagulation should be considered, especially if she were to demonstrate atrial fibrillation again.

## 2013-03-20 DIAGNOSIS — B961 Klebsiella pneumoniae [K. pneumoniae] as the cause of diseases classified elsewhere: Secondary | ICD-10-CM | POA: Diagnosis not present

## 2013-03-20 DIAGNOSIS — N390 Urinary tract infection, site not specified: Secondary | ICD-10-CM | POA: Diagnosis not present

## 2013-03-20 DIAGNOSIS — I9589 Other hypotension: Secondary | ICD-10-CM | POA: Diagnosis not present

## 2013-03-20 DIAGNOSIS — E119 Type 2 diabetes mellitus without complications: Secondary | ICD-10-CM | POA: Diagnosis not present

## 2013-03-20 LAB — GLUCOSE, CAPILLARY
Glucose-Capillary: 226 mg/dL — ABNORMAL HIGH (ref 70–99)
Glucose-Capillary: 179 mg/dL — ABNORMAL HIGH (ref 70–99)
Glucose-Capillary: 150 mg/dL — ABNORMAL HIGH (ref 70–99)

## 2013-03-20 NOTE — Progress Notes (Signed)
[name], Ann [name]              ACCOUNT NO.:  [id number]  MEDICAL RECORD NO.:  [id number]  LOCATION:  A307                          FACILITY:  [facility]  PHYSICIAN:  Ann Homer. Sudie [name], M.D.DATE OF BIRTH:  06-Mar-1946  DATE OF PROCEDURE: DATE OF DISCHARGE:                                PROGRESS NOTE   SUBJECTIVE:  This patient of Dr. Kari Baars feels better today.  She was admitted to the hospital on March 15, 2013 with what turned out to be a Klebsiella urinary tract infection.  This is being appropriately treated.  She also had hypotension which was felt to be possibly due to sepsis.  She seemed to be getting better but then needed adjustment of medications.  Cardiology was called in.  This has been done.  OBJECTIVE:  VITAL SIGNS:  Temperature is 97.6, pulse 57, respiratory rate 20, blood pressure 148/81. GENERAL:  She is sitting up in a chair.  She has O2 going by nasal prongs.  Her color is good.  Her speech is normal.  Her sensorium is intact.  Family members in the room. LUNGS:  Showed decreased breath sounds throughout, but she is moving air well.  There are no intercostal retractions. HEART:  Has a fairly regular rhythm.  Rate is about 80. ABDOMEN:  Soft and obese without organomegaly or mass or tenderness. There is no CVA or flank pain.  She has trace edema of the ankles.  Her urine grew out greater than 100,000 Klebsiella pneumoniae, which was sensitive to everything tested except intermediately sensitive to nitrofurantoin.  ASSESSMENT: 1. Klebsiella pneumoniae urinary tract infection. 2. Recent hypotension. 3. Diabetes. 4. Chronic obstructive pulmonary disease. 5. Chronic use of steroids, now with adrenal insufficiency. 6. Dehydration, cleared. 7. Morbid obesity. 8. Chronic diastolic heart failure. 9. Hypothyroidism. 10.Hypokalemia, which has cleared.  PLAN:  She will continue her current medications and treatments.  I am rechecking a CBC and BMP  tomorrow.  I reviewed her blood work and x-rays during this hospitalization.     Ann Homer. Sudie [name], M.D.     SDK/MEDQ  D:  03/20/2013  T:  03/20/2013  Job:  [id number]

## 2013-03-21 DIAGNOSIS — E119 Type 2 diabetes mellitus without complications: Secondary | ICD-10-CM | POA: Diagnosis not present

## 2013-03-21 DIAGNOSIS — N390 Urinary tract infection, site not specified: Secondary | ICD-10-CM | POA: Diagnosis not present

## 2013-03-21 DIAGNOSIS — B961 Klebsiella pneumoniae [K. pneumoniae] as the cause of diseases classified elsewhere: Secondary | ICD-10-CM | POA: Diagnosis not present

## 2013-03-21 DIAGNOSIS — I9589 Other hypotension: Secondary | ICD-10-CM | POA: Diagnosis not present

## 2013-03-21 LAB — CULTURE, BLOOD (ROUTINE X 2): Culture: NO GROWTH

## 2013-03-21 LAB — CBC
Hemoglobin: 12.1 g/dL (ref 12.0–15.0)
MCH: 29.2 pg (ref 26.0–34.0)
MCHC: 32.5 g/dL (ref 30.0–36.0)
MCV: 89.9 fL (ref 78.0–100.0)
Platelets: 233 10*3/uL (ref 150–400)
RDW: 15.8 % — ABNORMAL HIGH (ref 11.5–15.5)
WBC: 9.7 10*3/uL (ref 4.0–10.5)

## 2013-03-21 LAB — GLUCOSE, CAPILLARY
Glucose-Capillary: 163 mg/dL — ABNORMAL HIGH (ref 70–99)
Glucose-Capillary: 146 mg/dL — ABNORMAL HIGH (ref 70–99)

## 2013-03-21 LAB — BASIC METABOLIC PANEL
Calcium: 9.5 mg/dL (ref 8.4–10.5)
Creatinine, Ser: 0.82 mg/dL (ref 0.50–1.10)
GFR calc Af Amer: 84 mL/min — ABNORMAL LOW (ref >90)
GFR calc non Af Amer: 72 mL/min — ABNORMAL LOW (ref >90)
Glucose, Bld: 63 mg/dL — ABNORMAL LOW (ref 70–99)

## 2013-03-21 MED ORDER — POTASSIUM CHLORIDE CRYS ER 20 MEQ PO TBCR
30.0000 meq | EXTENDED_RELEASE_TABLET | Freq: Two times a day (BID) | ORAL | Status: DC
  Administered 2013-03-21 (×2): 30 meq via ORAL
  Filled 2013-03-21 (×4): qty 1

## 2013-03-21 MED ORDER — AMOXICILLIN 250 MG PO CAPS
500.0000 mg | ORAL_CAPSULE | Freq: Three times a day (TID) | ORAL | Status: DC
  Administered 2013-03-21 – 2013-03-22 (×3): 500 mg via ORAL
  Filled 2013-03-21 (×3): qty 2

## 2013-03-21 MED ORDER — INSULIN ASPART 100 UNIT/ML ~~LOC~~ SOLN
0.0000 [IU] | Freq: Three times a day (TID) | SUBCUTANEOUS | Status: DC
  Administered 2013-03-21: 3 [IU] via SUBCUTANEOUS
  Administered 2013-03-21: 4 [IU] via SUBCUTANEOUS

## 2013-03-21 NOTE — Progress Notes (Signed)
MD informed of patient morning blood sugar, orders given.

## 2013-03-21 NOTE — Progress Notes (Signed)
NAMEJULITA, OZBUN              ACCOUNT NO.:  [id number]  MEDICAL RECORD NO.:  [id number]  LOCATION:  A307                          FACILITY:  [facility]  PHYSICIAN:  Mila Homer. Sudie Bailey, M.D.DATE OF BIRTH:  1946-04-09  DATE OF PROCEDURE: DATE OF DISCHARGE:                                PROGRESS NOTE   SUBJECTIVE:  This 67 year old is doing well.  She really is feeling better.  She may be almost back to baseline.  OBJECTIVE:  VITAL SIGNS:  Temperature is 98.2, pulse 57, respiratory rate 20, blood pressure 160/78. GENERAL:  She is supine in bed.  She is in no acute distress. HEART:  Her heart has a regular rhythm with a rate of 60. LUNGS:  Clear throughout.  She is moving air well.  She has decreased breath sounds. ABDOMEN:  Soft without organomegaly or mass or tenderness today.  Her BMP today showed potassium of 3.2.  Her CBC was essentially normal.  ASSESSMENT: 1. Klebsiella pneumoniae urinary tract infection. 2. Recent hypotension, now normotensive. 3. Hypokalemia. 4. Diabetes.  PLAN:  I reviewed her blood glucoses, which shows generally been stable, but her blood glucose was 63 today.  I am increasing her potassium chloride from 20 mEq b.i.d. to 30 mEq b.i.d. and rechecking her BMP in the morning.  She is to continue on all other medications and treatment.  I talked to nursing.  She has been on resistant sliding scale insulin, and this will be switched to moderate sliding scale insulin.     Mila Homer. Sudie Bailey, M.D.     SDK/MEDQ  D:  03/21/2013  T:  03/21/2013  Job:  [id number]
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
## 2013-03-22 DIAGNOSIS — J449 Chronic obstructive pulmonary disease, unspecified: Secondary | ICD-10-CM | POA: Diagnosis not present

## 2013-03-22 DIAGNOSIS — A419 Sepsis, unspecified organism: Secondary | ICD-10-CM | POA: Diagnosis not present

## 2013-03-22 DIAGNOSIS — N390 Urinary tract infection, site not specified: Secondary | ICD-10-CM | POA: Diagnosis not present

## 2013-03-22 LAB — BASIC METABOLIC PANEL
CO2: 31 mEq/L (ref 19–32)
Calcium: 9.8 mg/dL (ref 8.4–10.5)
Creatinine, Ser: 0.82 mg/dL (ref 0.50–1.10)
Potassium: 3.9 mEq/L (ref 3.5–5.1)
Sodium: 140 mEq/L (ref 135–145)

## 2013-03-22 LAB — GLUCOSE, CAPILLARY: Glucose-Capillary: 106 mg/dL — ABNORMAL HIGH (ref 70–99)

## 2013-03-22 MED ORDER — HYDRALAZINE HCL 50 MG PO TABS: 50.0000 mg | ORAL_TABLET | Freq: Four times a day (QID) | ORAL | Status: DC

## 2013-03-22 MED ORDER — ATORVASTATIN CALCIUM 80 MG PO TABS: 80.0000 mg | ORAL_TABLET | Freq: Every day | ORAL | Status: DC

## 2013-03-22 MED ORDER — AMOXICILLIN 500 MG PO CAPS: 500.0000 mg | ORAL_CAPSULE | Freq: Three times a day (TID) | ORAL | Status: DC

## 2013-03-22 MED ORDER — HEPARIN SOD (PORK) LOCK FLUSH 100 UNIT/ML IV SOLN
500.0000 [IU] | INTRAVENOUS | Status: AC | PRN
  Administered 2013-03-22: 500 [IU]
  Filled 2013-03-22: qty 5

## 2013-03-22 MED ORDER — POTASSIUM CHLORIDE CRYS ER 15 MEQ PO TBCR: 30.0000 meq | EXTENDED_RELEASE_TABLET | Freq: Two times a day (BID) | ORAL | Status: DC

## 2013-03-22 MED ORDER — AMLODIPINE BESYLATE 10 MG PO TABS: 10.0000 mg | ORAL_TABLET | Freq: Every day | ORAL | Status: DC

## 2013-03-22 NOTE — Discharge Summary (Signed)
Physician Discharge Summary  Patient ID: Ann [name] MRN: [id number] DOB/AGE: 1946/05/26 67 y.o. Primary Care Physician:Daira Hine L, MD Admit date: 03/15/2013 Discharge date: 03/22/2013    Discharge Diagnoses:   Principal Problem:   Sepsis Active Problems:   HYPOTHYROIDISM   HYPERLIPIDEMIA   HYPERTENSION   CAD   GASTROESOPHAGEAL REFLUX DISEASE   RENAL FAILURE, CHRONIC   Diabetes mellitus type 2 with complications   Chronic diastolic heart failure   Adrenal insufficiency   UTI (lower urinary tract infection)   Hypokalemia   Morbid obesity   Atrial fibrillation   Hypotension   Elevated troponin  bradycardia    Medication List    STOP taking these medications       cloNIDine 0.1 MG tablet  Commonly known as:  CATAPRES     digoxin 0.125 MG tablet  Commonly known as:  LANOXIN      TAKE these medications       albuterol 108 (90 BASE) MCG/ACT inhaler  Commonly known as:  PROVENTIL HFA;VENTOLIN HFA  Inhale 2 puffs into the lungs every 4 (four) hours as needed for wheezing.     ALPRAZolam 0.5 MG tablet  Commonly known as:  XANAX  Take 1 tablet (0.5 mg total) by mouth 3 (three) times daily as needed for sleep.     amLODipine 10 MG tablet  Commonly known as:  NORVASC  Take 1 tablet (10 mg total) by mouth daily.     amoxicillin 500 MG capsule  Commonly known as:  AMOXIL  Take 1 capsule (500 mg total) by mouth every 8 (eight) hours.     aspirin EC 81 MG tablet  Take 81 mg by mouth daily.     atorvastatin 80 MG tablet  Commonly known as:  LIPITOR  Take 1 tablet (80 mg total) by mouth daily at 6 PM.     carvedilol 6.25 MG tablet  Commonly known as:  COREG  Take 6.25 mg by mouth 2 (two) times daily with a meal.     cyanocobalamin 1000 MCG tablet  Take 1 tablet (1,000 mcg total) by mouth daily.     fluticasone 50 MCG/ACT nasal spray  Commonly known as:  FLONASE  Place 2 sprays into the nose daily.     furosemide 40 MG tablet  Commonly known as:   LASIX  Take 40 mg by mouth daily.     gabapentin 100 MG capsule  Commonly known as:  NEURONTIN  Take 1 capsule (100 mg total) by mouth 3 (three) times daily.     hydrALAZINE 50 MG tablet  Commonly known as:  APRESOLINE  Take 1 tablet (50 mg total) by mouth every 6 (six) hours.     hydroxypropyl methylcellulose 2.5 % ophthalmic solution  Commonly known as:  ISOPTO TEARS  Place 1 drop into both eyes 3 (three) times daily as needed (dry eyes).     insulin aspart 100 UNIT/ML injection  Commonly known as:  novoLOG  Inject 4-10 Units into the skin 3 (three) times daily with meals. If blood sugar 200-250= 4 units; 251-300= 6 units; 301-350=8 units; 351-400= 10 units; >400= call MD     insulin detemir 100 UNIT/ML injection  Commonly known as:  LEVEMIR  Inject 15 Units into the skin at bedtime.     ipratropium-albuterol 0.5-2.5 (3) MG/3ML Soln  Commonly known as:  DUONEB  Take 3 mLs by nebulization every 6 (six) hours as needed (wheezing).     levothyroxine 200 MCG tablet  Commonly  known as:  SYNTHROID, LEVOTHROID  Take 1 tablet (200 mcg total) by mouth daily before breakfast.     metoCLOPramide 10 MG tablet  Commonly known as:  REGLAN  Take 1 tablet (10 mg total) by mouth 4 (four) times daily -  before meals and at bedtime.     nitroGLYCERIN 0.4 MG SL tablet  Commonly known as:  NITROSTAT  Place 1 tablet (0.4 mg total) under the tongue every 5 (five) minutes as needed for chest pain.     oxycodone 5 MG capsule  Commonly known as:  OXY-IR  Take 5 mg by mouth 3 (three) times daily as needed for pain.     potassium chloride SA 15 MEQ tablet  Commonly known as:  KLOR-CON M15  Take 2 tablets (30 mEq total) by mouth 2 (two) times daily.     predniSONE 10 MG tablet  Commonly known as:  DELTASONE  Take 10 mg by mouth daily.     sertraline 50 MG tablet  Commonly known as:  ZOLOFT  Take 50 mg by mouth at bedtime.     tolterodine 2 MG tablet  Commonly known as:  DETROL  Take 2  mg by mouth 2 (two) times daily.     vitamin A & D ointment  Apply 1 application topically as needed for dry skin.        Discharged Condition: Improved    Consults: Cardiology  Significant Diagnostic Studies: Dg Chest Port 1 View  03/15/2013   CLINICAL DATA:  Lobe blood pressure.  EXAM: PORTABLE CHEST - 1 VIEW  COMPARISON:  10/14/2012.  FINDINGS: Left central line tip proximal superior vena cava level.  Cardiomegaly.  Central pulmonary vascular prominence.  Slightly limited evaluation left lung base secondary to cardiomegaly and technique. No segmental consolidation noted.  No gross pneumothorax.  Prior cervical spine surgery.  Calcified slightly tortuous aorta.  IMPRESSION: Left central line tip proximal superior vena cava level.  Cardiomegaly.  Central pulmonary vascular prominence.  Slightly limited evaluation left lung base secondary to cardiomegaly and technique. No segmental consolidation noted.  Calcified slightly tortuous aorta.   Electronically Signed   By: Bridgett Larsson M.D.   On: 03/15/2013 14:55    Lab Results: Basic Metabolic Panel:  Recent Labs  16/10/96 0614 03/22/13 0516  NA 139 140  K 3.2* 3.9  CL 100 103  CO2 33* 31  GLUCOSE 63* 65*  BUN 11 11  CREATININE 0.82 0.82  CALCIUM 9.5 9.8   Liver Function Tests: No results found for this basename: AST, ALT, ALKPHOS, BILITOT, PROT, ALBUMIN,  in the last 72 hours   CBC:  Recent Labs  03/21/13 0614  WBC 9.7  HGB 12.1  HCT 37.2  MCV 89.9  PLT 233    Recent Results (from the past 240 hour(s))  CULTURE, BLOOD (ROUTINE X 2)     Status: None   Collection Time    03/15/13  2:31 PM      Result Value Range Status   Specimen Description BLOOD PORTA CATH DRAWN BY RN   Final   Special Requests BOTTLES DRAWN AEROBIC AND ANAEROBIC 6CC   Final   Culture NO GROWTH 6 DAYS   Final   Report Status 03/21/2013 FINAL   Final  CULTURE, BLOOD (ROUTINE X 2)     Status: None   Collection Time    03/15/13  2:35 PM       Result Value Range Status   Specimen Description BLOOD LEFT ANTECUBITAL  DRAWN BY RN   Final   Special Requests BOTTLES DRAWN AEROBIC ONLY 5CC   Final   Culture NO GROWTH 6 DAYS   Final   Report Status 03/21/2013 FINAL   Final  URINE CULTURE     Status: None   Collection Time    03/15/13  3:04 PM      Result Value Range Status   Specimen Description URINE, CLEAN CATCH   Final   Special Requests NONE   Final   Culture  Setup Time     Final   Value: 03/16/2013 00:28     Performed at [facility] Count     Final   Value: >=100,000 COLONIES/ML     Performed at [facility]   Culture     Final   Value: KLEBSIELLA PNEUMONIAE     Performed at [facility]   Report Status 03/17/2013 FINAL   Final   Organism ID, Bacteria KLEBSIELLA PNEUMONIAE   Final  MRSA PCR SCREENING     Status: Abnormal   Collection Time    03/15/13  5:56 PM      Result Value Range Status   MRSA by PCR POSITIVE (*) NEGATIVE Final   Comment:            The GeneXpert MRSA Assay (FDA     approved for NASAL specimens     only), is one component of a     comprehensive MRSA colonization     surveillance program. It is not     intended to diagnose MRSA     infection nor to guide or     monitor treatment for     MRSA infections.     RESULT CALLED TO, READ BACK BY AND VERIFIED WITH:     STONE A AT 2030 ON 102014 BY [facility]  URINE CULTURE     Status: None   Collection Time    03/16/13  5:30 AM      Result Value Range Status   Specimen Description URINE, CATHETERIZED   Final   Special Requests NONE   Final   Culture  Setup Time     Final   Value: 03/16/2013 10:30     Performed at [facility] Count     Final   Value: NO GROWTH     Performed at [facility]   Culture     Final   Value: NO GROWTH     Performed at [facility]   Report Status 03/17/2013 FINAL   Final     Hospital Course: This is a 67 year old who came to the emergency department  because she was weak. She said she had been feeling well was getting ready to go eat out and suddenly became very weak and felt like she could not see and she became very sweaty. She was brought to the emergency department where she was noted to be hypotensive. It was felt that she was probably septic and at appeared to be a urinary tract source. She eventually grew Klebsiella from her urine. She was treated with antibiotics fluid resuscitation and improved. She became hypertensive and her medications had to be adjusted. She had problem with bradycardia and she was on carvedilol, clonidine and digoxin. Clonidine and digoxin were discontinued. She was started on hydralazine and amlodipine with better control of her blood pressure. By the time of discharge he said she was back to baseline  Discharge Exam: Blood pressure 177/86, pulse 63, temperature 98.3 F (36.8 C), temperature source Oral, resp. rate 20, height 5\' 4"  (1.626 m), weight 98.113 kg (216 lb 4.8 oz), SpO2 96.00%. She is awake and alert. She is morbidly obese. Her chest is clear. Her heart is regular.  Disposition: Home with home health services      Discharge Orders   Future Orders Complete By Expires   Discharge patient  As directed    Face-to-face encounter (required for Medicare/Medicaid patients)  As directed    Comments:     I Parris Cudworth L certify that this patient is under my care and that I, or a nurse practitioner or physician's assistant working with me, had a face-to-face encounter that meets the physician face-to-face encounter requirements with this patient on 03/22/2013. The encounter with the patient was in whole, or in part for the following medical condition(s) which is the primary reason for home health care (List medical condition): sepsis   Questions:     The encounter with the patient was in whole, or in part, for the following medical condition, which is the primary reason for home health care:  sepsis   I  certify that, based on my findings, the following services are medically necessary home health services:  Nursing   Physical therapy   My clinical findings support the need for the above services:  Shortness of breath with activity   Further, I certify that my clinical findings support that this patient is homebound due to:  Shortness of Breath with activity   Reason for Medically Necessary Home Health Services:  Skilled Nursing- Change/Decline in Patient Status   Home Health  As directed    Questions:     To provide the following care/treatments:  RN   PT      Follow-up Information   Follow up with [facility].   Contact information:   [address] [place] [address] [phone]      Signed: Fredirick Maudlin Pager [phone]  03/22/2013, 9:01 AM

## 2013-03-22 NOTE — Progress Notes (Signed)
Subjective: She says she feels much better and wants to go home. Her blood pressure is better although it was somewhat elevated this morning. She has no other new complaints  Objective: Vital signs in last 24 hours: Temp:  [98.3 F (36.8 C)-98.4 F (36.9 C)] 98.3 F (36.8 C) (10/27 0522) Pulse Rate:  [56-63] 63 (10/27 0522) Resp:  [20] 20 (10/27 0522) BP: (140-177)/(64-86) 177/86 mmHg (10/27 0522) SpO2:  [94 %-97 %] 96 % (10/27 0730) Weight:  [98.113 kg (216 lb 4.8 oz)] 98.113 kg (216 lb 4.8 oz) (10/27 0416) Weight change: -4.309 kg (-9 lb 8 oz) Last BM Date: 03/21/13  Intake/Output from previous day: 10/26 0701 - 10/27 0700 In: 960 [P.O.:960] Out: 700 [Urine:700]  PHYSICAL EXAM General appearance: alert, cooperative, no distress and morbidly obese Resp: clear to auscultation bilaterally Cardio: regular rate and rhythm, S1, S2 normal, no murmur, click, rub or gallop GI: soft, non-tender; bowel sounds normal; no masses,  no organomegaly Extremities: extremities normal, atraumatic, no cyanosis or edema  Lab Results:    Basic Metabolic Panel:  Recent Labs  81/19/14 0614 03/22/13 0516  NA 139 140  K 3.2* 3.9  CL 100 103  CO2 33* 31  GLUCOSE 63* 65*  BUN 11 11  CREATININE 0.82 0.82  CALCIUM 9.5 9.8   Liver Function Tests: No results found for this basename: AST, ALT, ALKPHOS, BILITOT, PROT, ALBUMIN,  in the last 72 hours No results found for this basename: LIPASE, AMYLASE,  in the last 72 hours No results found for this basename: AMMONIA,  in the last 72 hours CBC:  Recent Labs  03/21/13 0614  WBC 9.7  HGB 12.1  HCT 37.2  MCV 89.9  PLT 233   Cardiac Enzymes: No results found for this basename: CKTOTAL, CKMB, CKMBINDEX, TROPONINI,  in the last 72 hours BNP: No results found for this basename: PROBNP,  in the last 72 hours D-Dimer: No results found for this basename: DDIMER,  in the last 72 hours CBG:  Recent Labs  03/21/13 0724 03/21/13 1133  03/21/13 1633 03/21/13 2031 03/22/13 0755 03/22/13 0826  GLUCAP 63* 147* 163* 146* 63* 106*   Hemoglobin A1C: No results found for this basename: HGBA1C,  in the last 72 hours Fasting Lipid Panel: No results found for this basename: CHOL, HDL, LDLCALC, TRIG, CHOLHDL, LDLDIRECT,  in the last 72 hours Thyroid Function Tests: No results found for this basename: TSH, T4TOTAL, FREET4, T3FREE, THYROIDAB,  in the last 72 hours Anemia Panel: No results found for this basename: VITAMINB12, FOLATE, FERRITIN, TIBC, IRON, RETICCTPCT,  in the last 72 hours Coagulation: No results found for this basename: LABPROT, INR,  in the last 72 hours Urine Drug Screen: Drugs of Abuse  No results found for this basename: labopia, cocainscrnur, labbenz, amphetmu, thcu, labbarb    Alcohol Level: No results found for this basename: ETH,  in the last 72 hours Urinalysis: No results found for this basename: COLORURINE, APPERANCEUR, LABSPEC, PHURINE, GLUCOSEU, HGBUR, BILIRUBINUR, KETONESUR, PROTEINUR, UROBILINOGEN, NITRITE, LEUKOCYTESUR,  in the last 72 hours Misc. Labs:  ABGS No results found for this basename: PHART, PCO2, PO2ART, TCO2, HCO3,  in the last 72 hours CULTURES Recent Results (from the past 240 hour(s))  CULTURE, BLOOD (ROUTINE X 2)     Status: None   Collection Time    03/15/13  2:31 PM      Result Value Range Status   Specimen Description BLOOD PORTA CATH DRAWN BY RN   Final  Special Requests BOTTLES DRAWN AEROBIC AND ANAEROBIC 6CC   Final   Culture NO GROWTH 6 DAYS   Final   Report Status 03/21/2013 FINAL   Final  CULTURE, BLOOD (ROUTINE X 2)     Status: None   Collection Time    03/15/13  2:35 PM      Result Value Range Status   Specimen Description BLOOD LEFT ANTECUBITAL DRAWN BY RN   Final   Special Requests BOTTLES DRAWN AEROBIC ONLY 5CC   Final   Culture NO GROWTH 6 DAYS   Final   Report Status 03/21/2013 FINAL   Final  URINE CULTURE     Status: None   Collection Time     03/15/13  3:04 PM      Result Value Range Status   Specimen Description URINE, CLEAN CATCH   Final   Special Requests NONE   Final   Culture  Setup Time     Final   Value: 03/16/2013 00:28     Performed at [facility] Count     Final   Value: >=100,000 COLONIES/ML     Performed at [facility]   Culture     Final   Value: KLEBSIELLA PNEUMONIAE     Performed at [facility]   Report Status 03/17/2013 FINAL   Final   Organism ID, Bacteria KLEBSIELLA PNEUMONIAE   Final  MRSA PCR SCREENING     Status: Abnormal   Collection Time    03/15/13  5:56 PM      Result Value Range Status   MRSA by PCR POSITIVE (*) NEGATIVE Final   Comment:            The GeneXpert MRSA Assay (FDA     approved for NASAL specimens     only), is one component of a     comprehensive MRSA colonization     surveillance program. It is not     intended to diagnose MRSA     infection nor to guide or     monitor treatment for     MRSA infections.     RESULT CALLED TO, READ BACK BY AND VERIFIED WITH:     STONE A AT 2030 ON 102014 BY FORSYTH K  URINE CULTURE     Status: None   Collection Time    03/16/13  5:30 AM      Result Value Range Status   Specimen Description URINE, CATHETERIZED   Final   Special Requests NONE   Final   Culture  Setup Time     Final   Value: 03/16/2013 10:30     Performed at [facility] Count     Final   Value: NO GROWTH     Performed at [facility]   Culture     Final   Value: NO GROWTH     Performed at [facility]   Report Status 03/17/2013 FINAL   Final   Studies/Results: No results found.  Medications:  Prior to Admission:  Prescriptions prior to admission  Medication Sig Dispense Refill  . albuterol (PROVENTIL HFA;VENTOLIN HFA) 108 (90 BASE) MCG/ACT inhaler Inhale 2 puffs into the lungs every 4 (four) hours as needed for wheezing.      Marland Kitchen ALPRAZolam (XANAX) 0.5 MG tablet Take 1 tablet (0.5 mg total) by  mouth 3 (three) times daily as needed for sleep.    0  . aspirin EC 81  MG tablet Take 81 mg by mouth daily.      . carvedilol (COREG) 6.25 MG tablet Take 6.25 mg by mouth 2 (two) times daily with a meal.      . fluticasone (FLONASE) 50 MCG/ACT nasal spray Place 2 sprays into the nose daily.    2  . furosemide (LASIX) 40 MG tablet Take 40 mg by mouth daily.      Marland Kitchen gabapentin (NEURONTIN) 100 MG capsule Take 1 capsule (100 mg total) by mouth 3 (three) times daily.      . hydroxypropyl methylcellulose (ISOPTO TEARS) 2.5 % ophthalmic solution Place 1 drop into both eyes 3 (three) times daily as needed (dry eyes).      . insulin aspart (NOVOLOG) 100 UNIT/ML injection Inject 4-10 Units into the skin 3 (three) times daily with meals. If blood sugar 200-250= 4 units; 251-300= 6 units; 301-350=8 units; 351-400= 10 units; >400= call MD      . insulin detemir (LEVEMIR) 100 UNIT/ML injection Inject 15 Units into the skin at bedtime.      Marland Kitchen ipratropium-albuterol (DUONEB) 0.5-2.5 (3) MG/3ML SOLN Take 3 mLs by nebulization every 6 (six) hours as needed (wheezing).      Marland Kitchen levothyroxine (SYNTHROID, LEVOTHROID) 200 MCG tablet Take 1 tablet (200 mcg total) by mouth daily before breakfast.      . metoCLOPramide (REGLAN) 10 MG tablet Take 1 tablet (10 mg total) by mouth 4 (four) times daily -  before meals and at bedtime.      Marland Kitchen oxycodone (OXY-IR) 5 MG capsule Take 5 mg by mouth 3 (three) times daily as needed for pain.      . predniSONE (DELTASONE) 10 MG tablet Take 10 mg by mouth daily.      . sertraline (ZOLOFT) 50 MG tablet Take 50 mg by mouth at bedtime.      . tolterodine (DETROL) 2 MG tablet Take 2 mg by mouth 2 (two) times daily.      . vitamin B-12 1000 MCG tablet Take 1 tablet (1,000 mcg total) by mouth daily.      . Vitamins A & D (VITAMIN A & D) ointment Apply 1 application topically as needed for dry skin.      . [DISCONTINUED] cloNIDine (CATAPRES) 0.1 MG tablet Take 0.1 mg by mouth 2 (two) times daily.       . [DISCONTINUED] digoxin (LANOXIN) 0.125 MG tablet Take 1 tablet (0.125 mg total) by mouth daily.      . nitroGLYCERIN (NITROSTAT) 0.4 MG SL tablet Place 1 tablet (0.4 mg total) under the tongue every 5 (five) minutes as needed for chest pain.    12   Scheduled: . albuterol  2.5 mg Nebulization Q4H  . amLODipine  10 mg Oral Daily  . amoxicillin  500 mg Oral Q8H  . aspirin  81 mg Oral Daily  . atorvastatin  80 mg Oral q1800  . carvedilol  6.25 mg Oral BID WC  . fluticasone  2 spray Each Nare Daily  . furosemide  20 mg Oral Daily  . gabapentin  100 mg Oral TID  . heparin  5,000 Units Subcutaneous Q8H  . hydrALAZINE  50 mg Oral Q6H  . insulin aspart  0-20 Units Subcutaneous TID WC  . insulin detemir  55 Units Subcutaneous QHS  . ipratropium  500 mcg Nebulization QID  . levothyroxine  200 mcg Oral QAC breakfast  . metoCLOPramide  10 mg Oral TID AC & HS  . potassium chloride  30 mEq Oral BID  . predniSONE  10 mg Oral Q breakfast  . sertraline  50 mg Oral QHS  . sodium chloride  3 mL Intravenous Q12H  . vitamin B-12  1,000 mcg Oral Daily   Continuous: . sodium chloride 20 mL/hr at 03/18/13 1400   ZOX:[id number], acetaminophen, albuterol, albuterol, ALPRAZolam, heparin lock flush, HYDROcodone-acetaminophen, ipratropium, nitroGLYCERIN, ondansetron (ZOFRAN) IV, ondansetron, oxyCODONE, polyvinyl alcohol  Assesment: She was admitted with sepsis from urinary tract origin. She has atrial fibrillation which is well controlled. She has hypertension and her medications have been altered because of bradycardia which is much better. She has chronic diastolic heart failure. She has type 2 diabetes with complications and her blood sugar has been trending on the low side. She has adrenal insufficiency which is being treated. She has morbid obesity which is better but still present Principal Problem:   Sepsis Active Problems:   HYPOTHYROIDISM   HYPERLIPIDEMIA   HYPERTENSION   CAD    GASTROESOPHAGEAL REFLUX DISEASE   RENAL FAILURE, CHRONIC   Diabetes mellitus type 2 with complications   Chronic diastolic heart failure   Adrenal insufficiency   UTI (lower urinary tract infection)   Hypokalemia   Morbid obesity   Atrial fibrillation   Hypotension   Elevated troponin    Plan: Continue medications. She will be discharged home today. Please see discharge summary for details    LOS: 7 days   Eswin Worrell L 03/22/2013, 8:53 AM

## 2013-03-22 NOTE — Progress Notes (Signed)
Inpatient Diabetes Program Recommendations  AACE/ADA: New Consensus Statement on Inpatient Glycemic Control (2013)  Target Ranges:  Prepandial:   less than 140 mg/dL      Peak postprandial:   less than 180 mg/dL (1-2 hours)      Critically ill patients:  140 - 180 mg/dL   Results for Ann [name], Ann [name] (MRN [id number]) as of 03/22/2013 08:54  Ref. Range 03/21/2013 07:24 03/21/2013 11:33 03/21/2013 16:33 03/21/2013 20:31 03/22/2013 07:55 03/22/2013 08:26  Glucose-Capillary Latest Range: 70-99 mg/dL 63 (L) 409 (H) 811 (H) 146 (H) 63 (L) 106 (H)   Inpatient Diabetes Program Recommendations Insulin - Basal: Please decrease Levemir to 53 units QHS.   Note: Fasting blood glucose noted to be 63 mg/dl for the last two morning and blood glucose through out the day otherwise in target of 140-180 mg/dl.  Please decrease Levemir to 53 units QHS.  Will continue to follow.  Thanks, Orlando Penner, RN, MSN, CCRN Diabetes Coordinator Inpatient Diabetes Program [phone] (Team Pager) [phone] ([facility] office) [phone] [facility] office)

## 2013-03-22 NOTE — Progress Notes (Signed)
Discharge instructions given to patient's husband, verbalized understanding. Dressing removed from Left buttock, blanchable area noted about 2x2 in size, skin intact. Dressing removed from Right posterior thigh underneath Rt. Buttock, blancheable nickle sized area noted with skin intact.  Dressing not replaced per patient request, informed patient and husband of findings underneath dressings.  Pt d/c home in stable condition, out via w/c with staff and husband with oxygen.

## 2013-03-23 DIAGNOSIS — I509 Heart failure, unspecified: Secondary | ICD-10-CM | POA: Diagnosis not present

## 2013-03-23 DIAGNOSIS — I5032 Chronic diastolic (congestive) heart failure: Secondary | ICD-10-CM | POA: Diagnosis not present

## 2013-03-23 DIAGNOSIS — I4891 Unspecified atrial fibrillation: Secondary | ICD-10-CM | POA: Diagnosis not present

## 2013-03-23 DIAGNOSIS — E119 Type 2 diabetes mellitus without complications: Secondary | ICD-10-CM | POA: Diagnosis not present

## 2013-03-23 NOTE — Progress Notes (Signed)
UR chart review completed.  

## 2013-03-24 DIAGNOSIS — I5032 Chronic diastolic (congestive) heart failure: Secondary | ICD-10-CM | POA: Diagnosis not present

## 2013-03-24 DIAGNOSIS — I509 Heart failure, unspecified: Secondary | ICD-10-CM | POA: Diagnosis not present

## 2013-03-24 DIAGNOSIS — I4891 Unspecified atrial fibrillation: Secondary | ICD-10-CM | POA: Diagnosis not present

## 2013-03-24 DIAGNOSIS — E119 Type 2 diabetes mellitus without complications: Secondary | ICD-10-CM | POA: Diagnosis not present

## 2013-03-25 DIAGNOSIS — I4891 Unspecified atrial fibrillation: Secondary | ICD-10-CM | POA: Diagnosis not present

## 2013-03-25 DIAGNOSIS — E119 Type 2 diabetes mellitus without complications: Secondary | ICD-10-CM | POA: Diagnosis not present

## 2013-03-25 DIAGNOSIS — I509 Heart failure, unspecified: Secondary | ICD-10-CM | POA: Diagnosis not present

## 2013-03-25 DIAGNOSIS — I5032 Chronic diastolic (congestive) heart failure: Secondary | ICD-10-CM | POA: Diagnosis not present

## 2013-03-29 DIAGNOSIS — I509 Heart failure, unspecified: Secondary | ICD-10-CM | POA: Diagnosis not present

## 2013-03-29 DIAGNOSIS — E119 Type 2 diabetes mellitus without complications: Secondary | ICD-10-CM | POA: Diagnosis not present

## 2013-03-29 DIAGNOSIS — I5032 Chronic diastolic (congestive) heart failure: Secondary | ICD-10-CM | POA: Diagnosis not present

## 2013-03-29 DIAGNOSIS — I4891 Unspecified atrial fibrillation: Secondary | ICD-10-CM | POA: Diagnosis not present

## 2013-03-30 DIAGNOSIS — E119 Type 2 diabetes mellitus without complications: Secondary | ICD-10-CM | POA: Diagnosis not present

## 2013-03-30 DIAGNOSIS — I5032 Chronic diastolic (congestive) heart failure: Secondary | ICD-10-CM | POA: Diagnosis not present

## 2013-03-30 DIAGNOSIS — I4891 Unspecified atrial fibrillation: Secondary | ICD-10-CM | POA: Diagnosis not present

## 2013-03-30 DIAGNOSIS — I509 Heart failure, unspecified: Secondary | ICD-10-CM | POA: Diagnosis not present

## 2013-04-01 ENCOUNTER — Other Ambulatory Visit: Payer: Self-pay

## 2013-04-01 DIAGNOSIS — I5032 Chronic diastolic (congestive) heart failure: Secondary | ICD-10-CM | POA: Diagnosis not present

## 2013-04-01 DIAGNOSIS — I4891 Unspecified atrial fibrillation: Secondary | ICD-10-CM | POA: Diagnosis not present

## 2013-04-01 DIAGNOSIS — I509 Heart failure, unspecified: Secondary | ICD-10-CM | POA: Diagnosis not present

## 2013-04-01 DIAGNOSIS — E119 Type 2 diabetes mellitus without complications: Secondary | ICD-10-CM | POA: Diagnosis not present

## 2013-04-02 DIAGNOSIS — E119 Type 2 diabetes mellitus without complications: Secondary | ICD-10-CM | POA: Diagnosis not present

## 2013-04-02 DIAGNOSIS — I4891 Unspecified atrial fibrillation: Secondary | ICD-10-CM | POA: Diagnosis not present

## 2013-04-02 DIAGNOSIS — I5032 Chronic diastolic (congestive) heart failure: Secondary | ICD-10-CM | POA: Diagnosis not present

## 2013-04-02 DIAGNOSIS — I509 Heart failure, unspecified: Secondary | ICD-10-CM | POA: Diagnosis not present

## 2013-04-06 DIAGNOSIS — I5032 Chronic diastolic (congestive) heart failure: Secondary | ICD-10-CM | POA: Diagnosis not present

## 2013-04-06 DIAGNOSIS — E119 Type 2 diabetes mellitus without complications: Secondary | ICD-10-CM | POA: Diagnosis not present

## 2013-04-06 DIAGNOSIS — I4891 Unspecified atrial fibrillation: Secondary | ICD-10-CM | POA: Diagnosis not present

## 2013-04-06 DIAGNOSIS — B351 Tinea unguium: Secondary | ICD-10-CM | POA: Diagnosis not present

## 2013-04-06 DIAGNOSIS — E1149 Type 2 diabetes mellitus with other diabetic neurological complication: Secondary | ICD-10-CM | POA: Diagnosis not present

## 2013-04-06 DIAGNOSIS — I509 Heart failure, unspecified: Secondary | ICD-10-CM | POA: Diagnosis not present

## 2013-04-07 DIAGNOSIS — I5032 Chronic diastolic (congestive) heart failure: Secondary | ICD-10-CM | POA: Diagnosis not present

## 2013-04-07 DIAGNOSIS — I4891 Unspecified atrial fibrillation: Secondary | ICD-10-CM | POA: Diagnosis not present

## 2013-04-07 DIAGNOSIS — E119 Type 2 diabetes mellitus without complications: Secondary | ICD-10-CM | POA: Diagnosis not present

## 2013-04-07 DIAGNOSIS — I509 Heart failure, unspecified: Secondary | ICD-10-CM | POA: Diagnosis not present

## 2013-04-09 DIAGNOSIS — I4891 Unspecified atrial fibrillation: Secondary | ICD-10-CM | POA: Diagnosis not present

## 2013-04-09 DIAGNOSIS — I5032 Chronic diastolic (congestive) heart failure: Secondary | ICD-10-CM | POA: Diagnosis not present

## 2013-04-09 DIAGNOSIS — I509 Heart failure, unspecified: Secondary | ICD-10-CM | POA: Diagnosis not present

## 2013-04-09 DIAGNOSIS — E119 Type 2 diabetes mellitus without complications: Secondary | ICD-10-CM | POA: Diagnosis not present

## 2013-04-13 DIAGNOSIS — I5032 Chronic diastolic (congestive) heart failure: Secondary | ICD-10-CM | POA: Diagnosis not present

## 2013-04-13 DIAGNOSIS — I509 Heart failure, unspecified: Secondary | ICD-10-CM | POA: Diagnosis not present

## 2013-04-13 DIAGNOSIS — E119 Type 2 diabetes mellitus without complications: Secondary | ICD-10-CM | POA: Diagnosis not present

## 2013-04-13 DIAGNOSIS — I4891 Unspecified atrial fibrillation: Secondary | ICD-10-CM | POA: Diagnosis not present

## 2013-04-14 DIAGNOSIS — I5032 Chronic diastolic (congestive) heart failure: Secondary | ICD-10-CM | POA: Diagnosis not present

## 2013-04-14 DIAGNOSIS — E119 Type 2 diabetes mellitus without complications: Secondary | ICD-10-CM | POA: Diagnosis not present

## 2013-04-14 DIAGNOSIS — I4891 Unspecified atrial fibrillation: Secondary | ICD-10-CM | POA: Diagnosis not present

## 2013-04-14 DIAGNOSIS — I509 Heart failure, unspecified: Secondary | ICD-10-CM | POA: Diagnosis not present

## 2013-04-15 DIAGNOSIS — I5032 Chronic diastolic (congestive) heart failure: Secondary | ICD-10-CM | POA: Diagnosis not present

## 2013-04-15 DIAGNOSIS — E119 Type 2 diabetes mellitus without complications: Secondary | ICD-10-CM | POA: Diagnosis not present

## 2013-04-15 DIAGNOSIS — I4891 Unspecified atrial fibrillation: Secondary | ICD-10-CM | POA: Diagnosis not present

## 2013-04-15 DIAGNOSIS — I509 Heart failure, unspecified: Secondary | ICD-10-CM | POA: Diagnosis not present

## 2013-04-16 DIAGNOSIS — I5032 Chronic diastolic (congestive) heart failure: Secondary | ICD-10-CM | POA: Diagnosis not present

## 2013-04-16 DIAGNOSIS — I4891 Unspecified atrial fibrillation: Secondary | ICD-10-CM | POA: Diagnosis not present

## 2013-04-16 DIAGNOSIS — I509 Heart failure, unspecified: Secondary | ICD-10-CM | POA: Diagnosis not present

## 2013-04-16 DIAGNOSIS — E119 Type 2 diabetes mellitus without complications: Secondary | ICD-10-CM | POA: Diagnosis not present

## 2013-04-19 DIAGNOSIS — I509 Heart failure, unspecified: Secondary | ICD-10-CM | POA: Diagnosis not present

## 2013-04-19 DIAGNOSIS — I5032 Chronic diastolic (congestive) heart failure: Secondary | ICD-10-CM | POA: Diagnosis not present

## 2013-04-19 DIAGNOSIS — E119 Type 2 diabetes mellitus without complications: Secondary | ICD-10-CM | POA: Diagnosis not present

## 2013-04-19 DIAGNOSIS — I4891 Unspecified atrial fibrillation: Secondary | ICD-10-CM | POA: Diagnosis not present

## 2013-04-20 DIAGNOSIS — I4891 Unspecified atrial fibrillation: Secondary | ICD-10-CM | POA: Diagnosis not present

## 2013-04-20 DIAGNOSIS — I5032 Chronic diastolic (congestive) heart failure: Secondary | ICD-10-CM | POA: Diagnosis not present

## 2013-04-20 DIAGNOSIS — I509 Heart failure, unspecified: Secondary | ICD-10-CM | POA: Diagnosis not present

## 2013-04-20 DIAGNOSIS — E119 Type 2 diabetes mellitus without complications: Secondary | ICD-10-CM | POA: Diagnosis not present

## 2013-04-21 DIAGNOSIS — I4891 Unspecified atrial fibrillation: Secondary | ICD-10-CM | POA: Diagnosis not present

## 2013-04-21 DIAGNOSIS — I5032 Chronic diastolic (congestive) heart failure: Secondary | ICD-10-CM | POA: Diagnosis not present

## 2013-04-21 DIAGNOSIS — E119 Type 2 diabetes mellitus without complications: Secondary | ICD-10-CM | POA: Diagnosis not present

## 2013-04-21 DIAGNOSIS — I509 Heart failure, unspecified: Secondary | ICD-10-CM | POA: Diagnosis not present

## 2013-04-26 ENCOUNTER — Ambulatory Visit (HOSPITAL_COMMUNITY)
Admission: RE | Admit: 2013-04-26 | Discharge: 2013-04-26 | Disposition: A | Discharge status: Home / Self Care | Payer: Medicare Other | Source: Ambulatory Visit | Attending: Pulmonary Disease | Admitting: Pulmonary Disease

## 2013-04-26 ENCOUNTER — Other Ambulatory Visit (HOSPITAL_COMMUNITY): Payer: Self-pay | Admitting: Pulmonary Disease

## 2013-04-26 DIAGNOSIS — I259 Chronic ischemic heart disease, unspecified: Secondary | ICD-10-CM | POA: Diagnosis not present

## 2013-04-26 DIAGNOSIS — M25511 Pain in right shoulder: Secondary | ICD-10-CM

## 2013-04-26 DIAGNOSIS — M25519 Pain in unspecified shoulder: Secondary | ICD-10-CM | POA: Diagnosis not present

## 2013-04-26 DIAGNOSIS — E039 Hypothyroidism, unspecified: Secondary | ICD-10-CM | POA: Diagnosis not present

## 2013-04-26 DIAGNOSIS — E118 Type 2 diabetes mellitus with unspecified complications: Secondary | ICD-10-CM | POA: Diagnosis not present

## 2013-04-26 DIAGNOSIS — I110 Hypertensive heart disease with heart failure: Secondary | ICD-10-CM | POA: Diagnosis not present

## 2013-05-06 ENCOUNTER — Ambulatory Visit: Payer: Medicare Other | Admitting: Cardiovascular Disease

## 2013-05-18 ENCOUNTER — Ambulatory Visit (INDEPENDENT_AMBULATORY_CARE_PROVIDER_SITE_OTHER): Payer: Medicare Other | Admitting: Cardiovascular Disease

## 2013-05-18 ENCOUNTER — Encounter: Payer: Self-pay | Admitting: Cardiovascular Disease

## 2013-05-18 VITALS — BP 131/89 | HR 66 | Ht 64.0 in | Wt 209.0 lb

## 2013-05-18 DIAGNOSIS — I2510 Atherosclerotic heart disease of native coronary artery without angina pectoris: Secondary | ICD-10-CM

## 2013-05-18 DIAGNOSIS — I5032 Chronic diastolic (congestive) heart failure: Secondary | ICD-10-CM | POA: Diagnosis not present

## 2013-05-18 DIAGNOSIS — I10 Essential (primary) hypertension: Secondary | ICD-10-CM

## 2013-05-18 DIAGNOSIS — E2749 Other adrenocortical insufficiency: Secondary | ICD-10-CM

## 2013-05-18 DIAGNOSIS — E2740 Unspecified adrenocortical insufficiency: Secondary | ICD-10-CM

## 2013-05-18 DIAGNOSIS — I4891 Unspecified atrial fibrillation: Secondary | ICD-10-CM | POA: Diagnosis not present

## 2013-05-18 DIAGNOSIS — Z9861 Coronary angioplasty status: Secondary | ICD-10-CM

## 2013-05-18 DIAGNOSIS — Z955 Presence of coronary angioplasty implant and graft: Secondary | ICD-10-CM

## 2013-05-18 DIAGNOSIS — E7800 Pure hypercholesterolemia, unspecified: Secondary | ICD-10-CM

## 2013-05-18 NOTE — Patient Instructions (Signed)
Your physician recommends that you schedule a follow-up appointment in: 3 months. Your physician recommends that you continue on your current medications as directed. Please refer to the Current Medication list given to you today. 
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
## 2013-06-15 DIAGNOSIS — E1149 Type 2 diabetes mellitus with other diabetic neurological complication: Secondary | ICD-10-CM | POA: Diagnosis not present

## 2013-06-15 DIAGNOSIS — L608 Other nail disorders: Secondary | ICD-10-CM | POA: Diagnosis not present

## 2013-06-16 DIAGNOSIS — S43429A Sprain of unspecified rotator cuff capsule, initial encounter: Secondary | ICD-10-CM | POA: Diagnosis not present

## 2013-08-10 DIAGNOSIS — D649 Anemia, unspecified: Secondary | ICD-10-CM | POA: Diagnosis not present

## 2013-08-10 DIAGNOSIS — E039 Hypothyroidism, unspecified: Secondary | ICD-10-CM | POA: Diagnosis not present

## 2013-08-10 DIAGNOSIS — E109 Type 1 diabetes mellitus without complications: Secondary | ICD-10-CM | POA: Diagnosis not present

## 2013-08-10 DIAGNOSIS — J449 Chronic obstructive pulmonary disease, unspecified: Secondary | ICD-10-CM | POA: Diagnosis not present

## 2013-08-10 DIAGNOSIS — J4489 Other specified chronic obstructive pulmonary disease: Secondary | ICD-10-CM | POA: Diagnosis not present

## 2013-08-10 DIAGNOSIS — E669 Obesity, unspecified: Secondary | ICD-10-CM | POA: Diagnosis not present

## 2013-08-10 DIAGNOSIS — I10 Essential (primary) hypertension: Secondary | ICD-10-CM | POA: Diagnosis not present

## 2013-08-10 DIAGNOSIS — I259 Chronic ischemic heart disease, unspecified: Secondary | ICD-10-CM | POA: Diagnosis not present

## 2013-08-10 DIAGNOSIS — E785 Hyperlipidemia, unspecified: Secondary | ICD-10-CM | POA: Diagnosis not present

## 2013-08-13 ENCOUNTER — Encounter: Payer: Self-pay | Admitting: Cardiovascular Disease

## 2013-08-13 ENCOUNTER — Ambulatory Visit (INDEPENDENT_AMBULATORY_CARE_PROVIDER_SITE_OTHER): Payer: Medicare Other | Admitting: Cardiovascular Disease

## 2013-08-13 VITALS — BP 114/63 | HR 66 | Ht 64.0 in | Wt 227.0 lb

## 2013-08-13 DIAGNOSIS — I2510 Atherosclerotic heart disease of native coronary artery without angina pectoris: Secondary | ICD-10-CM

## 2013-08-13 DIAGNOSIS — I10 Essential (primary) hypertension: Secondary | ICD-10-CM | POA: Diagnosis not present

## 2013-08-13 DIAGNOSIS — E7800 Pure hypercholesterolemia, unspecified: Secondary | ICD-10-CM

## 2013-08-13 DIAGNOSIS — I4891 Unspecified atrial fibrillation: Secondary | ICD-10-CM | POA: Diagnosis not present

## 2013-08-13 DIAGNOSIS — Z9861 Coronary angioplasty status: Secondary | ICD-10-CM

## 2013-08-13 DIAGNOSIS — I5032 Chronic diastolic (congestive) heart failure: Secondary | ICD-10-CM | POA: Diagnosis not present

## 2013-08-13 DIAGNOSIS — Z955 Presence of coronary angioplasty implant and graft: Secondary | ICD-10-CM

## 2013-08-13 NOTE — Patient Instructions (Signed)
Your physician wants you to follow-up in: 6 months You will receive a reminder letter in the mail two months in advance. If you don't receive a letter, please call our office to schedule the follow-up appointment.     Your physician recommends that you continue on your current medications as directed. Please refer to the Current Medication list given to you today.      Thank you for choosing [facility] !        
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
## 2013-08-17 DIAGNOSIS — IMO0001 Reserved for inherently not codable concepts without codable children: Secondary | ICD-10-CM | POA: Diagnosis not present

## 2013-08-17 DIAGNOSIS — E039 Hypothyroidism, unspecified: Secondary | ICD-10-CM | POA: Diagnosis not present

## 2013-08-17 DIAGNOSIS — E2749 Other adrenocortical insufficiency: Secondary | ICD-10-CM | POA: Diagnosis not present

## 2013-08-17 DIAGNOSIS — I10 Essential (primary) hypertension: Secondary | ICD-10-CM | POA: Diagnosis not present

## 2013-08-24 DIAGNOSIS — L851 Acquired keratosis [keratoderma] palmaris et plantaris: Secondary | ICD-10-CM | POA: Diagnosis not present

## 2013-08-24 DIAGNOSIS — E1149 Type 2 diabetes mellitus with other diabetic neurological complication: Secondary | ICD-10-CM | POA: Diagnosis not present

## 2013-08-24 DIAGNOSIS — L608 Other nail disorders: Secondary | ICD-10-CM | POA: Diagnosis not present

## 2013-11-02 DIAGNOSIS — R259 Unspecified abnormal involuntary movements: Secondary | ICD-10-CM | POA: Diagnosis not present

## 2013-11-02 DIAGNOSIS — L608 Other nail disorders: Secondary | ICD-10-CM | POA: Diagnosis not present

## 2013-11-02 DIAGNOSIS — L851 Acquired keratosis [keratoderma] palmaris et plantaris: Secondary | ICD-10-CM | POA: Diagnosis not present

## 2013-11-02 DIAGNOSIS — E1149 Type 2 diabetes mellitus with other diabetic neurological complication: Secondary | ICD-10-CM | POA: Diagnosis not present

## 2013-11-03 DIAGNOSIS — I10 Essential (primary) hypertension: Secondary | ICD-10-CM | POA: Diagnosis not present

## 2013-11-03 DIAGNOSIS — E2749 Other adrenocortical insufficiency: Secondary | ICD-10-CM | POA: Diagnosis not present

## 2013-11-03 DIAGNOSIS — IMO0001 Reserved for inherently not codable concepts without codable children: Secondary | ICD-10-CM | POA: Diagnosis not present

## 2013-11-10 DIAGNOSIS — IMO0001 Reserved for inherently not codable concepts without codable children: Secondary | ICD-10-CM | POA: Diagnosis not present

## 2013-11-10 DIAGNOSIS — E2749 Other adrenocortical insufficiency: Secondary | ICD-10-CM | POA: Diagnosis not present

## 2013-11-10 DIAGNOSIS — E669 Obesity, unspecified: Secondary | ICD-10-CM | POA: Diagnosis not present

## 2013-11-10 DIAGNOSIS — I10 Essential (primary) hypertension: Secondary | ICD-10-CM | POA: Diagnosis not present

## 2013-11-10 DIAGNOSIS — I110 Hypertensive heart disease with heart failure: Secondary | ICD-10-CM | POA: Diagnosis not present

## 2013-11-10 DIAGNOSIS — J449 Chronic obstructive pulmonary disease, unspecified: Secondary | ICD-10-CM | POA: Diagnosis not present

## 2013-11-10 DIAGNOSIS — I509 Heart failure, unspecified: Secondary | ICD-10-CM | POA: Diagnosis not present

## 2013-11-10 DIAGNOSIS — E039 Hypothyroidism, unspecified: Secondary | ICD-10-CM | POA: Diagnosis not present

## 2013-11-11 ENCOUNTER — Ambulatory Visit (INDEPENDENT_AMBULATORY_CARE_PROVIDER_SITE_OTHER): Payer: Medicare Other | Admitting: Neurology

## 2013-11-11 ENCOUNTER — Encounter: Payer: Self-pay | Admitting: Neurology

## 2013-11-11 VITALS — BP 84/51 | HR 64 | Ht 64.0 in | Wt 228.0 lb

## 2013-11-11 DIAGNOSIS — L02419 Cutaneous abscess of limb, unspecified: Secondary | ICD-10-CM

## 2013-11-11 DIAGNOSIS — I2510 Atherosclerotic heart disease of native coronary artery without angina pectoris: Secondary | ICD-10-CM

## 2013-11-11 DIAGNOSIS — L03119 Cellulitis of unspecified part of limb: Secondary | ICD-10-CM

## 2013-11-11 DIAGNOSIS — L03116 Cellulitis of left lower limb: Secondary | ICD-10-CM

## 2013-11-11 DIAGNOSIS — R269 Unspecified abnormalities of gait and mobility: Secondary | ICD-10-CM

## 2013-11-11 DIAGNOSIS — R609 Edema, unspecified: Secondary | ICD-10-CM

## 2013-11-11 DIAGNOSIS — R600 Localized edema: Secondary | ICD-10-CM
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
## 2013-11-20 ENCOUNTER — Ambulatory Visit
Admission: RE | Admit: 2013-11-20 | Discharge: 2013-11-20 | Disposition: A | Discharge status: Home / Self Care | Payer: Medicare Other | Source: Ambulatory Visit | Attending: Neurology | Admitting: Neurology

## 2013-11-20 DIAGNOSIS — M47812 Spondylosis without myelopathy or radiculopathy, cervical region: Secondary | ICD-10-CM | POA: Diagnosis not present

## 2013-11-20 DIAGNOSIS — M4802 Spinal stenosis, cervical region: Secondary | ICD-10-CM | POA: Diagnosis not present

## 2013-11-20 DIAGNOSIS — R269 Unspecified abnormalities of gait and mobility: Secondary | ICD-10-CM

## 2013-11-20 DIAGNOSIS — L03116 Cellulitis of left lower limb: Secondary | ICD-10-CM

## 2013-11-20 DIAGNOSIS — R600 Localized edema: Secondary | ICD-10-CM

## 2013-11-20 DIAGNOSIS — M5020 Other cervical disc displacement, unspecified cervical region: Secondary | ICD-10-CM | POA: Diagnosis not present

## 2013-11-23 ENCOUNTER — Telehealth: Payer: Self-pay | Admitting: Neurology

## 2013-11-23 NOTE — Telephone Encounter (Addendum)
Please Caucasian, MRI of the brain showed age related changes,  MRI cervical spine showed multilevel degenerative disc disease, evidence of previous surgery  will address her questions in her followup visit in July

## 2013-11-24 NOTE — Telephone Encounter (Signed)
Left message with MRI brain and cervical results, per Dr. Krista Blue.  Relayed that the doctor would address any questions on her follow up visit in July, and to call with further questions.

## 2013-12-20 ENCOUNTER — Encounter: Payer: Self-pay | Admitting: Neurology

## 2013-12-20 ENCOUNTER — Ambulatory Visit (INDEPENDENT_AMBULATORY_CARE_PROVIDER_SITE_OTHER): Payer: Medicare Other | Admitting: Neurology

## 2013-12-20 VITALS — BP 92/63 | HR 60 | Ht 64.0 in | Wt 229.0 lb

## 2013-12-20 DIAGNOSIS — I4891 Unspecified atrial fibrillation: Secondary | ICD-10-CM

## 2013-12-20 DIAGNOSIS — R269 Unspecified abnormalities of gait and mobility: Secondary | ICD-10-CM | POA: Diagnosis not present

## 2013-12-20 DIAGNOSIS — I2510 Atherosclerotic heart disease of native coronary artery without angina pectoris: Secondary | ICD-10-CM
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
## 2013-12-28 ENCOUNTER — Ambulatory Visit
Admission: RE | Admit: 2013-12-28 | Discharge: 2013-12-28 | Disposition: A | Discharge status: Home / Self Care | Payer: Medicare Other | Source: Ambulatory Visit | Attending: Neurology | Admitting: Neurology

## 2013-12-28 DIAGNOSIS — M47817 Spondylosis without myelopathy or radiculopathy, lumbosacral region: Secondary | ICD-10-CM | POA: Diagnosis not present

## 2013-12-28 DIAGNOSIS — R269 Unspecified abnormalities of gait and mobility: Secondary | ICD-10-CM

## 2013-12-28 DIAGNOSIS — I4891 Unspecified atrial fibrillation: Secondary | ICD-10-CM

## 2013-12-28 DIAGNOSIS — M519 Unspecified thoracic, thoracolumbar and lumbosacral intervertebral disc disorder: Secondary | ICD-10-CM | POA: Diagnosis not present

## 2013-12-28 DIAGNOSIS — M5126 Other intervertebral disc displacement, lumbar region: Secondary | ICD-10-CM | POA: Diagnosis not present

## 2013-12-29 ENCOUNTER — Telehealth: Payer: Self-pay | Admitting: Neurology

## 2013-12-29 NOTE — Telephone Encounter (Signed)
Please call patient, MRI of thoracic spine, and lumbar spine showed no significant abnormality, there was no evidence of canal, or foraminal stenosis

## 2013-12-30 NOTE — Telephone Encounter (Signed)
Called pt and spoke with pt and pt's husband Ann [name] informing them that the referral was sent in already to [facility] and also gave them the number. I also informed them per Dr. Krista Blue that the pt's MRI of the thoracic spine and lumbar spine showed no significant abnormality, there was no evidence of canal or foraminal stenosis. I advised them that if the pt has any other problems, questions or concerns to call the office. Pt and husband verbalized understanding.

## 2014-01-19 ENCOUNTER — Encounter (INDEPENDENT_AMBULATORY_CARE_PROVIDER_SITE_OTHER): Payer: Self-pay

## 2014-01-19 ENCOUNTER — Ambulatory Visit (INDEPENDENT_AMBULATORY_CARE_PROVIDER_SITE_OTHER): Payer: Medicare Other | Admitting: Neurology

## 2014-01-19 DIAGNOSIS — I4891 Unspecified atrial fibrillation: Secondary | ICD-10-CM | POA: Diagnosis not present

## 2014-01-19 DIAGNOSIS — I2510 Atherosclerotic heart disease of native coronary artery without angina pectoris: Secondary | ICD-10-CM | POA: Diagnosis not present

## 2014-01-19 DIAGNOSIS — Z0289 Encounter for other administrative examinations: Secondary | ICD-10-CM

## 2014-01-19 DIAGNOSIS — R269 Unspecified abnormalities of gait and mobility: Secondary | ICD-10-CM

## 2014-01-19 DIAGNOSIS — R251 Tremor, unspecified: Secondary | ICD-10-CM

## 2014-01-19 DIAGNOSIS — R259 Unspecified abnormal involuntary movements: Secondary | ICD-10-CM | POA: Diagnosis not present
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
## 2014-01-20 ENCOUNTER — Ambulatory Visit (HOSPITAL_COMMUNITY)
Admission: RE | Admit: 2014-01-20 | Discharge: 2014-01-20 | Disposition: A | Discharge status: Home / Self Care | Payer: Medicare Other | Source: Ambulatory Visit | Attending: Neurology | Admitting: Neurology

## 2014-01-20 DIAGNOSIS — R269 Unspecified abnormalities of gait and mobility: Secondary | ICD-10-CM | POA: Diagnosis not present

## 2014-01-20 DIAGNOSIS — IMO0001 Reserved for inherently not codable concepts without codable children: Secondary | ICD-10-CM | POA: Diagnosis not present

## 2014-01-20 DIAGNOSIS — R2689 Other abnormalities of gait and mobility: Secondary | ICD-10-CM | POA: Insufficient documentation

## 2014-01-20 DIAGNOSIS — R29898 Other symptoms and signs involving the musculoskeletal system: Secondary | ICD-10-CM | POA: Insufficient documentation
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
## 2014-01-21 NOTE — Procedures (Signed)
   NCS (NERVE CONDUCTION STUDY) WITH EMG (ELECTROMYOGRAPHY) REPORT   STUDY DATE: August 24th 2015 PATIENT NAME: ERUM CERCONE DOB: 1946/02/23 MRN: [id number]    TECHNOLOGIST: Laretta Alstrom ELECTROMYOGRAPHER: Marcial Pacas M.D.  CLINICAL INFORMATION:  68 year old female, with past medical history of obesity, diabetes, presenting with bilateral lower extremity tremor, worsening gait difficulty  FINDINGS: NERVE CONDUCTION STUDY: Bilateral peroneal sensory responses were normal. Bilateral peroneal, right tibial motor responses were normal. Bilateral tibial H. reflexes were present and symmetric. Left tibial motor response showed moderate decreased C. map amplitude, this is could due to technical difficulties, she has mild bilateral lower extremity edema  Right median, ulnar sensory and motor responses were normal  NEEDLE ELECTROMYOGRAPHY: Selected needle examination was performed at right lower extremity muscles, right lumbosacral paraspinal muscles.  Needle examination of right tibialis anterior, tibialis posterior, peroneal longus, medial gastrocnemius, vastus lateralis, biceps femoris short head was normal  There was no spontaneous activity at right lumbosacral paraspinal muscles, right L4-5 S1  IMPRESSION:  This is a normal study. There was no electrodiagnostic evidence of large fiber peripheral neuropathy, or right lumbar radiculopathy. Patient was noted to have tremor during needle examination, which seems to be under her voluntary control  INTERPRETING PHYSICIAN:   Marcial Pacas M.D. Ph.D. [facility] [address], [place], [place] [address] [phone]

## 2014-01-25 ENCOUNTER — Ambulatory Visit (HOSPITAL_COMMUNITY)
Admission: RE | Admit: 2014-01-25 | Discharge: 2014-01-25 | Disposition: A | Discharge status: Home / Self Care | Payer: Medicare Other | Source: Ambulatory Visit | Attending: Neurology | Admitting: Neurology

## 2014-01-25 DIAGNOSIS — R29898 Other symptoms and signs involving the musculoskeletal system: Secondary | ICD-10-CM | POA: Diagnosis not present

## 2014-01-25 DIAGNOSIS — IMO0001 Reserved for inherently not codable concepts without codable children: Secondary | ICD-10-CM | POA: Diagnosis not present

## 2014-01-25 DIAGNOSIS — R269 Unspecified abnormalities of gait and mobility: Secondary | ICD-10-CM | POA: Insufficient documentation

## 2014-01-25 NOTE — Progress Notes (Signed)
Physical Therapy Treatment Patient Details  Name: Ann [name] MRN: [id number] Date of Birth: June 20, 1945  Today's Date: 01/25/2014 Time: 9476-5465 PT Time Calculation (min): 45 min  Visit#: 2 of 12  Re-eval: 02/19/14 Authorization: medicare  Charges:  Gait 1050-1105 (15'), therex 1105-1130 (25')  Subjective: Symptoms/Limitations Symptoms: PT reports she's been doing her HEP.  PT comes today using RW.  No c/o pain. Pain Assessment Currently in Pain?: No/denies   Exercise/Treatments Standing Gait Training: SPC 2X34', RW 112' Seated Long Arc Quad: 10 reps Other Seated Knee Exercises: Dorsiflexion/plantarflexion x 10 Other Seated Knee Exercises: w-backs, thoracic excursions with UE's crossed 10 reps Supine Bridges: 10 reps Straight Leg Raises: 10 reps Sidelying Hip ABduction: 10 reps Clams: 10 reps 3" holds Prone  Hamstring Curl: 10 reps Hip Extension: 10 reps      Physical Therapy Assessment and Plan PT Assessment and Plan Clinical Impression Statement: Progressed ambulation and activity tolerance today.  Pt able to complete max distance of 112 feet using RW and 34' using SPC before having to rest due to fatigue.  Pt without LOB with use of SPC, however required cues for sequencing and posture.  Added prone exericses today with patient able to complete bed mobility with modified independence.  Pt instructed to continue her HEP given at initiatl evaluation.  PT and spouse verbalized understanding.   PT Plan: Progreses exercises to improve strength, balance. posture and overall independence.      Problem List Patient Active Problem List   Diagnosis Date Noted  . Poor balance 01/20/2014  . Tremor 01/19/2014  . Abnormality of gait 11/11/2013  . Sepsis 03/15/2013  . Hypotension 03/15/2013  . Elevated troponin 03/15/2013  . VRE (vancomycin resistant enterococcus) culture positive 10/10/2012  . Chest pain 10/10/2012  . Nonsustained ventricular tachycardia 10/10/2012   . Cellulitis of arm, right 10/06/2012  . Acute renal failure (ARF) 10/06/2012  . Intractable nausea and vomiting 09/15/2012  . Hydronephrosis 09/15/2012  . Heme positive stool 09/10/2012  . Nausea with vomiting 09/10/2012  . Abdominal mass, right lower quadrant 09/10/2012  . Abnormal LFTs 09/09/2012  . Rectal bleeding 08/22/2012  . Anemia 08/22/2012  . Chest pain, unspecified 08/09/2012  . Diverticulitis of colon (without mention of hemorrhage) 08/09/2012  . Acute on chronic diastolic heart failure 03/54/6568  . Hypotension, unspecified 07/30/2012  . Septic shock 07/30/2012  . UTI (urinary tract infection) 07/30/2012  . FTT (failure to thrive) in adult 07/21/2012  . Protein-calorie malnutrition, severe 07/21/2012  . Atrial fibrillation 07/02/2012  . Lethargy 06/29/2012  . Vitamin B12 deficiency 06/28/2012  . Sinusitis chronic, frontal 06/28/2012  . Bilateral lower extremity edema 06/28/2012  . Left leg cellulitis 06/28/2012  . Morbid obesity 06/28/2012  . Chronic kidney disease, stage III (moderate) 06/28/2012  . ARF (acute renal failure) 06/27/2012  . UTI (lower urinary tract infection) 06/27/2012  . Hypokalemia 06/27/2012  . Shortness of breath 06/27/2012  . Lower extremity weakness 06/14/2012  . Multiple skin tears 06/14/2012  . Adrenal insufficiency 01/07/2012  . Diabetes mellitus type 2 with complications 12/75/1700  . Chronic diastolic heart failure 17/49/4496  . Cellulitis of leg without foot, left 02/11/2011  . GANGLION OF TENDON SHEATH 02/21/2010  . Edema 01/19/2009  . HYPOTHYROIDISM 12/01/2008  . HYPERLIPIDEMIA 12/01/2008  . HYPERTENSION 12/01/2008  . CAD 12/01/2008  . GASTROESOPHAGEAL REFLUX DISEASE 12/01/2008  . RENAL FAILURE, CHRONIC 12/01/2008        Teena Irani, PTA/CLT 01/25/2014, 12:00 PM

## 2014-01-28 ENCOUNTER — Ambulatory Visit (HOSPITAL_COMMUNITY)
Admission: RE | Admit: 2014-01-28 | Discharge: 2014-01-28 | Disposition: A | Discharge status: Home / Self Care | Payer: Medicare Other | Source: Ambulatory Visit | Attending: Neurology | Admitting: Neurology

## 2014-01-28 DIAGNOSIS — IMO0001 Reserved for inherently not codable concepts without codable children: Secondary | ICD-10-CM | POA: Diagnosis not present

## 2014-01-28 DIAGNOSIS — R2689 Other abnormalities of gait and mobility: Secondary | ICD-10-CM

## 2014-01-28 DIAGNOSIS — R29898 Other symptoms and signs involving the musculoskeletal system: Secondary | ICD-10-CM | POA: Diagnosis not present

## 2014-01-28 DIAGNOSIS — R269 Unspecified abnormalities of gait and mobility: Secondary | ICD-10-CM | POA: Diagnosis not present

## 2014-01-28 NOTE — Progress Notes (Signed)
Physical Therapy Treatment Patient Details  Name: Ann [name] MRN: [id number] Date of Birth: December 04, 1945  Today's Date: 01/28/2014 Time: 2297-9892 PT Time Calculation (min): 30 min    Charges TE [id number], Gait training [phone] Visit#: 3 of 12  Re-eval: 02/19/14 Assessment Diagnosis: unsteady gt  Authorization: medicare    Subjective: Symptoms/Limitations Symptoms: Patient  reports she's been doing her HEP.  Patient comes today using RW.  No c/o pain.Patient states she's feeling stronger Pain Assessment Currently in Pain?: No/denies  Exercise/Treatments Standing Gait Training: RW 2x 175 ft Seated Long Arc Quad: 20 reps Other Seated Knee Exercises: Dorsiflexion/plantarflexion x 20 Other Seated Knee Exercises: w-backs 20x , thoracic excursions with UE's crossed 10 reps Supine Bridges: 20 reps Straight Leg Raises: 20 reps Sidelying Hip ABduction: 20 reps Clams: 20 Prone  Hamstring Curl: 20 reps Hip Extension: 20 reps  Physical Therapy Assessment and Plan PT Assessment and Plan Clinical Impression Statement: Progressed ambulation and activity tolerance today. Pt able to complete max distance of 175 feet using RW, 2 times. Increased repetitions of exercises with patient displaying little to no difficulty. PT Plan: Progreses exercise intensity to improve strength, balance. posture and overall independence.     Goals PT Short Term Goals PT Short Term Goal 1: Pt to be able to stand for three minutes to allow easier abilty to dress  PT Short Term Goal 1 - Progress: Progressing toward goal PT Short Term Goal 2: Pt to be able to walk with walker for five minute to decrease stress on caregiver  PT Short Term Goal 2 - Progress: Progressing toward goal PT Short Term Goal 3: Pt to be able to come sit to stand 4 times in 30 seconds to show improved power so pt is able to come sit to stand with safety. PT Short Term Goal 3 - Progress: Progressing toward goal  Problem  List Patient Active Problem List   Diagnosis Date Noted  . Poor balance 01/20/2014  . Tremor 01/19/2014  . Abnormality of gait 11/11/2013  . Sepsis 03/15/2013  . Hypotension 03/15/2013  . Elevated troponin 03/15/2013  . VRE (vancomycin resistant enterococcus) culture positive 10/10/2012  . Chest pain 10/10/2012  . Nonsustained ventricular tachycardia 10/10/2012  . Cellulitis of arm, right 10/06/2012  . Acute renal failure (ARF) 10/06/2012  . Intractable nausea and vomiting 09/15/2012  . Hydronephrosis 09/15/2012  . Heme positive stool 09/10/2012  . Nausea with vomiting 09/10/2012  . Abdominal mass, right lower quadrant 09/10/2012  . Abnormal LFTs 09/09/2012  . Rectal bleeding 08/22/2012  . Anemia 08/22/2012  . Chest pain, unspecified 08/09/2012  . Diverticulitis of colon (without mention of hemorrhage) 08/09/2012  . Acute on chronic diastolic heart failure 63/14/9702  . Hypotension, unspecified 07/30/2012  . Septic shock 07/30/2012  . UTI (urinary tract infection) 07/30/2012  . FTT (failure to thrive) in adult 07/21/2012  . Protein-calorie malnutrition, severe 07/21/2012  . Atrial fibrillation 07/02/2012  . Lethargy 06/29/2012  . Vitamin B12 deficiency 06/28/2012  . Sinusitis chronic, frontal 06/28/2012  . Bilateral lower extremity edema 06/28/2012  . Left leg cellulitis 06/28/2012  . Morbid obesity 06/28/2012  . Chronic kidney disease, stage III (moderate) 06/28/2012  . ARF (acute renal failure) 06/27/2012  . UTI (lower urinary tract infection) 06/27/2012  . Hypokalemia 06/27/2012  . Shortness of breath 06/27/2012  . Lower extremity weakness 06/14/2012  . Multiple skin tears 06/14/2012  . Adrenal insufficiency 01/07/2012  . Diabetes mellitus type 2 with complications 63/78/5885  . Chronic diastolic heart  failure 04/19/2011  . Cellulitis of leg without foot, left 02/11/2011  . GANGLION OF TENDON SHEATH 02/21/2010  . Edema 01/19/2009  . HYPOTHYROIDISM 12/01/2008  .  HYPERLIPIDEMIA 12/01/2008  . HYPERTENSION 12/01/2008  . CAD 12/01/2008  . GASTROESOPHAGEAL REFLUX DISEASE 12/01/2008  . RENAL FAILURE, CHRONIC 12/01/2008    PT - End of Session Activity Tolerance: Patient tolerated treatment well General Behavior During Therapy: [facility] for tasks assessed/performed  GP    Aamir Mclinden R 01/28/2014, 12:23 PM

## 2014-02-02 ENCOUNTER — Ambulatory Visit (HOSPITAL_COMMUNITY)
Admission: RE | Admit: 2014-02-02 | Discharge: 2014-02-02 | Disposition: A | Discharge status: Home / Self Care | Payer: Medicare Other | Source: Ambulatory Visit | Attending: Neurology | Admitting: Neurology

## 2014-02-02 DIAGNOSIS — E2749 Other adrenocortical insufficiency: Secondary | ICD-10-CM | POA: Diagnosis not present

## 2014-02-02 DIAGNOSIS — R29898 Other symptoms and signs involving the musculoskeletal system: Secondary | ICD-10-CM | POA: Diagnosis not present

## 2014-02-02 DIAGNOSIS — E039 Hypothyroidism, unspecified: Secondary | ICD-10-CM | POA: Diagnosis not present

## 2014-02-02 DIAGNOSIS — IMO0001 Reserved for inherently not codable concepts without codable children: Secondary | ICD-10-CM | POA: Diagnosis not present

## 2014-02-02 DIAGNOSIS — R269 Unspecified abnormalities of gait and mobility: Secondary | ICD-10-CM | POA: Diagnosis not present

## 2014-02-02 NOTE — Progress Notes (Signed)
Physical Therapy Treatment Patient Details  Name: Ann [name] MRN: [id number] Date of Birth: October 28, 1945  Today's Date: 02/02/2014 Time: 0850-0930 PT Time Calculation (min): 40 min Visit#: 4 of 12  Re-eval: 02/19/14 Authorization: medicare  Authorization Visit#: 4 of 10  Charges:  Gait [phone] (15'), therex [phone] (25')  Subjective: Symptoms/Limitations Symptoms: Pt states she is feeling stronger and has been complaint with HEP.  No pain reported today. Pain Assessment Currently in Pain?: No/denies   Exercise/Treatments Standing Gait Training: RW 1 x 226 ft, 1X 175 ft in 2 minutes Other Standing Knee Exercises: standing tolerance without UE assist 1'40" Seated Long Arc Quad: 20 reps Other Seated Knee Exercises: Dorsiflexion/plantarflexion x 20 Other Seated Knee Exercises: w-backs 20x , thoracic excursions with UE's crossed 10 reps Supine Bridges: 20 reps Straight Leg Raises: 20 reps Sidelying Hip ABduction: 20 reps Prone  Hamstring Curl: 20 reps Hip Extension: 20 reps     Physical Therapy Assessment and Plan PT Assessment and Plan Clinical Impression Statement: Able to increase ambulation distance X 50 more feet today. Pt timed to complete 175 feet in 2 minutes.  Maximal standing tolerance without UE is 1'40".   Pt requires max cues for erect posture and head up with ambulation. Therapist facilitation required for correct form of therex with decreased speed with performance PT Plan: Progreses exercises to improve strength, balance. posture and overall independence.   Progress to standing exercises next visit.     Problem List Patient Active Problem List   Diagnosis Date Noted  . Poor balance 01/20/2014  . Tremor 01/19/2014  . Abnormality of gait 11/11/2013  . Sepsis 03/15/2013  . Hypotension 03/15/2013  . Elevated troponin 03/15/2013  . VRE (vancomycin resistant enterococcus) culture positive 10/10/2012  . Chest pain 10/10/2012  . Nonsustained ventricular  tachycardia 10/10/2012  . Cellulitis of arm, right 10/06/2012  . Acute renal failure (ARF) 10/06/2012  . Intractable nausea and vomiting 09/15/2012  . Hydronephrosis 09/15/2012  . Heme positive stool 09/10/2012  . Nausea with vomiting 09/10/2012  . Abdominal mass, right lower quadrant 09/10/2012  . Abnormal LFTs 09/09/2012  . Rectal bleeding 08/22/2012  . Anemia 08/22/2012  . Chest pain, unspecified 08/09/2012  . Diverticulitis of colon (without mention of hemorrhage) 08/09/2012  . Acute on chronic diastolic heart failure 70/17/7939  . Hypotension, unspecified 07/30/2012  . Septic shock 07/30/2012  . UTI (urinary tract infection) 07/30/2012  . FTT (failure to thrive) in adult 07/21/2012  . Protein-calorie malnutrition, severe 07/21/2012  . Atrial fibrillation 07/02/2012  . Lethargy 06/29/2012  . Vitamin B12 deficiency 06/28/2012  . Sinusitis chronic, frontal 06/28/2012  . Bilateral lower extremity edema 06/28/2012  . Left leg cellulitis 06/28/2012  . Morbid obesity 06/28/2012  . Chronic kidney disease, stage III (moderate) 06/28/2012  . ARF (acute renal failure) 06/27/2012  . UTI (lower urinary tract infection) 06/27/2012  . Hypokalemia 06/27/2012  . Shortness of breath 06/27/2012  . Lower extremity weakness 06/14/2012  . Multiple skin tears 06/14/2012  . Adrenal insufficiency 01/07/2012  . Diabetes mellitus type 2 with complications 03/00/9233  . Chronic diastolic heart failure 00/76/2263  . Cellulitis of leg without foot, left 02/11/2011  . GANGLION OF TENDON SHEATH 02/21/2010  . Edema 01/19/2009  . HYPOTHYROIDISM 12/01/2008  . HYPERLIPIDEMIA 12/01/2008  . HYPERTENSION 12/01/2008  . CAD 12/01/2008  . GASTROESOPHAGEAL REFLUX DISEASE 12/01/2008  . RENAL FAILURE, CHRONIC 12/01/2008    PT - End of Session Activity Tolerance: Patient tolerated treatment well General Behavior During Therapy: [facility]  for tasks assessed/performed   Teena Irani, PTA/CLT 02/02/2014, 9:40  AM

## 2014-02-07 ENCOUNTER — Ambulatory Visit (HOSPITAL_COMMUNITY)
Admission: RE | Admit: 2014-02-07 | Discharge: 2014-02-07 | Disposition: A | Discharge status: Home / Self Care | Payer: Medicare Other | Source: Ambulatory Visit | Attending: Pulmonary Disease | Admitting: Pulmonary Disease

## 2014-02-07 DIAGNOSIS — IMO0001 Reserved for inherently not codable concepts without codable children: Secondary | ICD-10-CM | POA: Diagnosis not present

## 2014-02-07 DIAGNOSIS — R29898 Other symptoms and signs involving the musculoskeletal system: Secondary | ICD-10-CM | POA: Diagnosis not present

## 2014-02-07 DIAGNOSIS — R269 Unspecified abnormalities of gait and mobility: Secondary | ICD-10-CM | POA: Diagnosis not present

## 2014-02-07 NOTE — Progress Notes (Signed)
Physical Therapy Treatment Patient Details  Name: Ann [name] MRN: [id number] Date of Birth: Dec 03, 1945  Today's Date: 02/07/2014 Time: 6440-3474 PT Time Calculation (min): 43 min TE 1347-1430  Visit#: 5 of 12  Re-eval: 02/19/14      Subjective: Pain Assessment Pain Score: 8  Pain Location: Knee Pain Orientation: Right;Left Pain Type: Chronic pain   Exercise/Treatments Standing Heel Raises: 10 reps;Limitations Heel Raises Limitations: Toe Raises, x10 Gait Training: RW 1 x 226 ft Other Standing Knee Exercises: Standing tolerance without UE, 1' x3 Other Standing Knee Exercises: Sit <-> Stand x5 Seated Long Arc Quad: 20 reps;Weights Long Arc Quad Weight: 1 lbs. Stool Scoot - Round Trips: HS Curls, GTB, 2x10 Other Seated Knee Exercises: March, 1# (B) LE, x20 Other Seated Knee Exercises: Row with GTB x10, thoracic excursions with UE's crossed 10 reps Supine Straight Leg Raises: 2 sets;10 reps;Limitations Straight Leg Raises Limitations: 1# Sidelying Hip ABduction: 2 sets;10 reps;Limitations Hip ABduction Limitations: 1#  Physical Therapy Assessment and Plan PT Assessment and Plan Clinical Impression Statement: Pt was able to tolerate weight during some supine and sitting exericses today as strength improves.  Pt did require rest breaks between activities secondary to muscle fatigue and SOB with supine exericses.  Added resisitance with scapular stabilization exercises to improve posture during gait, though max VC was required during standing tolerance and gait for upright standing.  Pt will benefit from skilled therapeutic intervention in order to improve on the following deficits: Decreased activity tolerance;Decreased balance;Difficulty walking;Pain Rehab Potential: Good PT Frequency: Min 3X/week PT Duration: 4 weeks PT Treatment/Interventions: Gait training;Therapeutic activities;Functional mobility training;Therapeutic exercise;Balance training;Patient/family  education PT Plan: Progreses exercises to improve strength, balance. posture and overall independence.      Goals PT Short Term Goals PT Short Term Goal 1: Pt to be able to stand for three minutes to allow easier abilty to dress  PT Short Term Goal 1 - Progress: Progressing toward goal PT Short Term Goal 2: Pt to be able to walk with walker for five minute to decrease stress on caregiver  PT Short Term Goal 2 - Progress: Progressing toward goal PT Short Term Goal 3: Pt to be able to come sit to stand 4 times in 30 seconds to show improved power so pt is able to come sit to stand with safety. PT Short Term Goal 3 - Progress: Progressing toward goal  Problem List Patient Active Problem List   Diagnosis Date Noted  . Poor balance 01/20/2014  . Tremor 01/19/2014  . Abnormality of gait 11/11/2013  . Sepsis 03/15/2013  . Hypotension 03/15/2013  . Elevated troponin 03/15/2013  . VRE (vancomycin resistant enterococcus) culture positive 10/10/2012  . Chest pain 10/10/2012  . Nonsustained ventricular tachycardia 10/10/2012  . Cellulitis of arm, right 10/06/2012  . Acute renal failure (ARF) 10/06/2012  . Intractable nausea and vomiting 09/15/2012  . Hydronephrosis 09/15/2012  . Heme positive stool 09/10/2012  . Nausea with vomiting 09/10/2012  . Abdominal mass, right lower quadrant 09/10/2012  . Abnormal LFTs 09/09/2012  . Rectal bleeding 08/22/2012  . Anemia 08/22/2012  . Chest pain, unspecified 08/09/2012  . Diverticulitis of colon (without mention of hemorrhage) 08/09/2012  . Acute on chronic diastolic heart failure 25/95/6387  . Hypotension, unspecified 07/30/2012  . Septic shock 07/30/2012  . UTI (urinary tract infection) 07/30/2012  . FTT (failure to thrive) in adult 07/21/2012  . Protein-calorie malnutrition, severe 07/21/2012  . Atrial fibrillation 07/02/2012  . Lethargy 06/29/2012  . Vitamin B12 deficiency 06/28/2012  .  Sinusitis chronic, frontal 06/28/2012  . Bilateral  lower extremity edema 06/28/2012  . Left leg cellulitis 06/28/2012  . Morbid obesity 06/28/2012  . Chronic kidney disease, stage III (moderate) 06/28/2012  . ARF (acute renal failure) 06/27/2012  . UTI (lower urinary tract infection) 06/27/2012  . Hypokalemia 06/27/2012  . Shortness of breath 06/27/2012  . Lower extremity weakness 06/14/2012  . Multiple skin tears 06/14/2012  . Adrenal insufficiency 01/07/2012  . Diabetes mellitus type 2 with complications 50/93/2671  . Chronic diastolic heart failure 24/58/0998  . Cellulitis of leg without foot, left 02/11/2011  . GANGLION OF TENDON SHEATH 02/21/2010  . Edema 01/19/2009  . HYPOTHYROIDISM 12/01/2008  . HYPERLIPIDEMIA 12/01/2008  . HYPERTENSION 12/01/2008  . CAD 12/01/2008  . GASTROESOPHAGEAL REFLUX DISEASE 12/01/2008  . RENAL FAILURE, CHRONIC 12/01/2008    PT - End of Session Activity Tolerance: Patient tolerated treatment well General Behavior During Therapy: [facility] for tasks assessed/performed   Whisper Kurka 02/07/2014, 2:43 PM

## 2014-02-08 ENCOUNTER — Telehealth: Payer: Self-pay | Admitting: Neurology

## 2014-02-08 NOTE — Telephone Encounter (Signed)
Patient's spouse returning Ann Dyer C.'s call.

## 2014-02-08 NOTE — Telephone Encounter (Signed)
Called patient back and she wants to come in and see Dr.Yan sooner. Dr . Krista Blue had a cancellation.

## 2014-02-09 DIAGNOSIS — I10 Essential (primary) hypertension: Secondary | ICD-10-CM | POA: Diagnosis not present

## 2014-02-09 DIAGNOSIS — J449 Chronic obstructive pulmonary disease, unspecified: Secondary | ICD-10-CM | POA: Diagnosis not present

## 2014-02-09 DIAGNOSIS — Z23 Encounter for immunization: Secondary | ICD-10-CM | POA: Diagnosis not present

## 2014-02-09 DIAGNOSIS — I259 Chronic ischemic heart disease, unspecified: Secondary | ICD-10-CM | POA: Diagnosis not present

## 2014-02-10 DIAGNOSIS — IMO0001 Reserved for inherently not codable concepts without codable children: Secondary | ICD-10-CM | POA: Diagnosis not present

## 2014-02-10 DIAGNOSIS — E2749 Other adrenocortical insufficiency: Secondary | ICD-10-CM | POA: Diagnosis not present

## 2014-02-10 DIAGNOSIS — E669 Obesity, unspecified: Secondary | ICD-10-CM | POA: Diagnosis not present

## 2014-02-10 DIAGNOSIS — E785 Hyperlipidemia, unspecified: Secondary | ICD-10-CM | POA: Diagnosis not present

## 2014-02-10 DIAGNOSIS — I10 Essential (primary) hypertension: Secondary | ICD-10-CM | POA: Diagnosis not present

## 2014-02-10 DIAGNOSIS — E039 Hypothyroidism, unspecified: Secondary | ICD-10-CM | POA: Diagnosis not present

## 2014-02-11 ENCOUNTER — Inpatient Hospital Stay (HOSPITAL_COMMUNITY): Admission: RE | Admit: 2014-02-11 | Payer: Medicare Other | Source: Ambulatory Visit | Admitting: Physical Therapy

## 2014-02-11 ENCOUNTER — Encounter: Payer: Self-pay | Admitting: Neurology

## 2014-02-11 ENCOUNTER — Ambulatory Visit (INDEPENDENT_AMBULATORY_CARE_PROVIDER_SITE_OTHER): Payer: Medicare Other | Admitting: Neurology

## 2014-02-11 VITALS — BP 88/51 | HR 62 | Ht 64.0 in

## 2014-02-11 DIAGNOSIS — R269 Unspecified abnormalities of gait and mobility: Secondary | ICD-10-CM | POA: Diagnosis not present

## 2014-02-11 DIAGNOSIS — I2510 Atherosclerotic heart disease of native coronary artery without angina pectoris: Secondary | ICD-10-CM | POA: Diagnosis not present

## 2014-02-11 MED ORDER — SERTRALINE HCL 100 MG PO TABS: 50.0000 mg | ORAL_TABLET | Freq: Every day | ORAL | Status: DC

## 2014-02-11 MED ORDER — SERTRALINE HCL 100 MG PO TABS: 100.0000 mg | ORAL_TABLET | Freq: Every day | ORAL | Status: DC
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
## 2014-02-14 ENCOUNTER — Ambulatory Visit (HOSPITAL_COMMUNITY)
Admission: RE | Admit: 2014-02-14 | Discharge: 2014-02-14 | Disposition: A | Discharge status: Home / Self Care | Payer: Medicare Other | Source: Ambulatory Visit | Attending: Neurology | Admitting: Neurology

## 2014-02-14 DIAGNOSIS — R29898 Other symptoms and signs involving the musculoskeletal system: Secondary | ICD-10-CM | POA: Diagnosis not present

## 2014-02-14 DIAGNOSIS — IMO0001 Reserved for inherently not codable concepts without codable children: Secondary | ICD-10-CM | POA: Diagnosis not present

## 2014-02-14 DIAGNOSIS — R269 Unspecified abnormalities of gait and mobility: Secondary | ICD-10-CM | POA: Diagnosis not present

## 2014-02-14 NOTE — Progress Notes (Signed)
Physical Therapy Treatment Patient Details  Name: Ann [name] MRN: [id number] Date of Birth: 12-26-1945  Today's Date: 02/14/2014 Time: 0930-1018 PT Time Calculation (min): 48 min  Visit#: 6 of 12  Re-eval: 02/19/14 Authorization: medicare  Authorization Visit#: 6 of 10  Charges:  Gait [phone] therex [phone]  Subjective: Symptoms/Limitations Symptoms: Pt states her neurologist has turned her loose.  States they ran tests and could not find out what was wrong with her.  Pain Assessment Currently in Pain?: No/denies   Exercise/Treatments Aerobic Stationary Bike: nustep level 2 hills #2 8 minutes Standing Heel Raises: 10 reps;Limitations Heel Raises Limitations: Toe Raises, x10 Gait Training: RW 1 x 226 ft Other Standing Knee Exercises: Marching 10X3 at parallel bars Seated Long Arc Quad: 10 reps Long Arc Quad Weight: 3 lbs. Other Seated Knee Exercises: sit to stand 5 reps Other Seated Knee Exercises: w-backs, Rows with GTB 2 sets 10X, thoracic excursions with UE's crossed Supine Bridges: 20 reps Straight Leg Raises: 2 sets;10 reps;Limitations Straight Leg Raises Limitations: 3# Sidelying Hip ABduction: 2 sets;10 reps;Limitations Hip ABduction Limitations: 3#     Physical Therapy Assessment and Plan PT Assessment and Plan Clinical Impression Statement: Pt continues to present with tremors in LE's.   Progressed marching activity to standing and increased weights with mat actvities.  PT with improved thoracic posture with gait, however continues to look down.  Added nustep today for activity tolerance. Pt required one seated rest break wtih standing activiies.  PT Plan: Progreses exercises to improve strength, balance. posture and overall independence.       Problem List Patient Active Problem List   Diagnosis Date Noted  . Poor balance 01/20/2014  . Tremor 01/19/2014  . Abnormality of gait 11/11/2013  . Sepsis 03/15/2013  . Hypotension 03/15/2013   . Elevated troponin 03/15/2013  . VRE (vancomycin resistant enterococcus) culture positive 10/10/2012  . Chest pain 10/10/2012  . Nonsustained ventricular tachycardia 10/10/2012  . Cellulitis of arm, right 10/06/2012  . Acute renal failure (ARF) 10/06/2012  . Intractable nausea and vomiting 09/15/2012  . Hydronephrosis 09/15/2012  . Heme positive stool 09/10/2012  . Nausea with vomiting 09/10/2012  . Abdominal mass, right lower quadrant 09/10/2012  . Abnormal LFTs 09/09/2012  . Rectal bleeding 08/22/2012  . Anemia 08/22/2012  . Chest pain, unspecified 08/09/2012  . Diverticulitis of colon (without mention of hemorrhage) 08/09/2012  . Acute on chronic diastolic heart failure 62/83/6629  . Hypotension, unspecified 07/30/2012  . Septic shock 07/30/2012  . UTI (urinary tract infection) 07/30/2012  . FTT (failure to thrive) in adult 07/21/2012  . Protein-calorie malnutrition, severe 07/21/2012  . Atrial fibrillation 07/02/2012  . Lethargy 06/29/2012  . Vitamin B12 deficiency 06/28/2012  . Sinusitis chronic, frontal 06/28/2012  . Bilateral lower extremity edema 06/28/2012  . Left leg cellulitis 06/28/2012  . Morbid obesity 06/28/2012  . Chronic kidney disease, stage III (moderate) 06/28/2012  . ARF (acute renal failure) 06/27/2012  . UTI (lower urinary tract infection) 06/27/2012  . Hypokalemia 06/27/2012  . Shortness of breath 06/27/2012  . Lower extremity weakness 06/14/2012  . Multiple skin tears 06/14/2012  . Adrenal insufficiency 01/07/2012  . Diabetes mellitus type 2 with complications 47/65/4650  . Chronic diastolic heart failure 35/46/5681  . Cellulitis of leg without foot, left 02/11/2011  . GANGLION OF TENDON SHEATH 02/21/2010  . Edema 01/19/2009  . HYPOTHYROIDISM 12/01/2008  . HYPERLIPIDEMIA 12/01/2008  . HYPERTENSION 12/01/2008  . CAD 12/01/2008  . GASTROESOPHAGEAL REFLUX DISEASE 12/01/2008  .  RENAL FAILURE, CHRONIC 12/01/2008    PT - End of Session Activity  Tolerance: Patient tolerated treatment well General Behavior During Therapy: WFL for tasks assessed/performed      Teena Irani, PTA/CLT 02/14/2014, 10:55 AM

## 2014-02-16 ENCOUNTER — Ambulatory Visit (HOSPITAL_COMMUNITY)
Admission: RE | Admit: 2014-02-16 | Discharge: 2014-02-16 | Disposition: A | Discharge status: Home / Self Care | Payer: Medicare Other | Source: Ambulatory Visit | Attending: Neurology | Admitting: Neurology

## 2014-02-16 DIAGNOSIS — R2689 Other abnormalities of gait and mobility: Secondary | ICD-10-CM

## 2014-02-16 DIAGNOSIS — IMO0001 Reserved for inherently not codable concepts without codable children: Secondary | ICD-10-CM | POA: Diagnosis not present

## 2014-02-16 DIAGNOSIS — R29898 Other symptoms and signs involving the musculoskeletal system: Secondary | ICD-10-CM | POA: Diagnosis not present

## 2014-02-16 DIAGNOSIS — R269 Unspecified abnormalities of gait and mobility: Secondary | ICD-10-CM | POA: Diagnosis not present

## 2014-02-16 NOTE — Progress Notes (Signed)
Physical Therapy Treatment Patient Details  Name: Ann [name] MRN: [id number] Date of Birth: 01-20-1946  Today's Date: 02/16/2014 Time: 1345-1430 PT Time Calculation (min): 45 min Charge:  There ex 1345-1430 Visit#: 7 of 12  Re-eval: 02/19/14    Authorization: medicare   Authorization Visit#: 7 of 10   Subjective: Symptoms/Limitations Symptoms: Pt is doing her exercises at home.  She is still not cooking  Pain Assessment Pain Score: 4  Pain Location: Knee Pain Orientation: Right;Left Pain Type: Chronic pain      Exercise/Treatments Standing Functional Squat: 10 reps Other Standing Knee Exercises: marching x 10  Seated Long Arc Quad: 10 reps Other Seated Knee Exercises: sit to stand 10 reps; scapular retraction x 10 Other Seated Knee Exercises: w back x 10  Supine Other Supine Knee Exercises: decompression exercises 1-5; Trunk rotation with arms opposite direcion x 10  Other Supine Knee Exercises: ab set pushing elbows in mat x 15     Physical Therapy Assessment and Plan PT Assessment and Plan Clinical Impression Statement: Pt continues to walk and stand in a forward bent  position,  Therapis explained that pt will continue to have knee pain and balance issues if she does not improve her posture.  Pt treatment focused on postural exercises adding supine decompression exercises for improved posture alignment.  Pt given exercise sheet to complete these exercises every night prior to going to bed.  PT Plan: Focus on pt ablility to remain upright -inquire whether pt is doing decompression exercises 1-5 .        Problem List Patient Active Problem List   Diagnosis Date Noted  . Poor balance 01/20/2014  . Tremor 01/19/2014  . Abnormality of gait 11/11/2013  . Sepsis 03/15/2013  . Hypotension 03/15/2013  . Elevated troponin 03/15/2013  . VRE (vancomycin resistant enterococcus) culture positive 10/10/2012  . Chest pain 10/10/2012  . Nonsustained ventricular  tachycardia 10/10/2012  . Cellulitis of arm, right 10/06/2012  . Acute renal failure (ARF) 10/06/2012  . Intractable nausea and vomiting 09/15/2012  . Hydronephrosis 09/15/2012  . Heme positive stool 09/10/2012  . Nausea with vomiting 09/10/2012  . Abdominal mass, right lower quadrant 09/10/2012  . Abnormal LFTs 09/09/2012  . Rectal bleeding 08/22/2012  . Anemia 08/22/2012  . Chest pain, unspecified 08/09/2012  . Diverticulitis of colon (without mention of hemorrhage) 08/09/2012  . Acute on chronic diastolic heart failure 55/73/2202  . Hypotension, unspecified 07/30/2012  . Septic shock 07/30/2012  . UTI (urinary tract infection) 07/30/2012  . FTT (failure to thrive) in adult 07/21/2012  . Protein-calorie malnutrition, severe 07/21/2012  . Atrial fibrillation 07/02/2012  . Lethargy 06/29/2012  . Vitamin B12 deficiency 06/28/2012  . Sinusitis chronic, frontal 06/28/2012  . Bilateral lower extremity edema 06/28/2012  . Left leg cellulitis 06/28/2012  . Morbid obesity 06/28/2012  . Chronic kidney disease, stage III (moderate) 06/28/2012  . ARF (acute renal failure) 06/27/2012  . UTI (lower urinary tract infection) 06/27/2012  . Hypokalemia 06/27/2012  . Shortness of breath 06/27/2012  . Lower extremity weakness 06/14/2012  . Multiple skin tears 06/14/2012  . Adrenal insufficiency 01/07/2012  . Diabetes mellitus type 2 with complications 54/27/0623  . Chronic diastolic heart failure 76/28/3151  . Cellulitis of leg without foot, left 02/11/2011  . GANGLION OF TENDON SHEATH 02/21/2010  . Edema 01/19/2009  . HYPOTHYROIDISM 12/01/2008  . HYPERLIPIDEMIA 12/01/2008  . HYPERTENSION 12/01/2008  . CAD 12/01/2008  . GASTROESOPHAGEAL REFLUX DISEASE 12/01/2008  . RENAL FAILURE, CHRONIC 12/01/2008  PT Plan of Care PT Home Exercise Plan: new given   GP    RUSSELL,CINDY 02/16/2014, 3:40 PM

## 2014-02-21 ENCOUNTER — Telehealth (HOSPITAL_COMMUNITY): Payer: Self-pay

## 2014-02-21 ENCOUNTER — Ambulatory Visit (HOSPITAL_COMMUNITY): Payer: Medicare Other | Admitting: Physical Therapy
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
## 2014-02-25 ENCOUNTER — Ambulatory Visit (HOSPITAL_COMMUNITY): Payer: Medicare Other | Admitting: Physical Therapy

## 2014-03-07 ENCOUNTER — Telehealth (HOSPITAL_COMMUNITY): Payer: Self-pay

## 2014-03-07 NOTE — Telephone Encounter (Signed)
Called to see if more appointments needed to be scheduled.... She hasn't been seen since 9/23

## 2014-03-08 DIAGNOSIS — R251 Tremor, unspecified: Secondary | ICD-10-CM | POA: Diagnosis not present

## 2014-03-08 DIAGNOSIS — Z23 Encounter for immunization: Secondary | ICD-10-CM | POA: Diagnosis not present

## 2014-03-08 DIAGNOSIS — S61532A Puncture wound without foreign body of left wrist, initial encounter: Secondary | ICD-10-CM | POA: Diagnosis not present

## 2014-03-08 DIAGNOSIS — H6692 Otitis media, unspecified, left ear: Secondary | ICD-10-CM | POA: Diagnosis not present

## 2014-03-08 DIAGNOSIS — J029 Acute pharyngitis, unspecified: Secondary | ICD-10-CM | POA: Diagnosis not present

## 2014-03-10 NOTE — Addendum Note (Signed)
Encounter addended by: Leeroy Cha, PT on: 03/10/2014  8:48 AM<BR>     Documentation filed: Episodes

## 2014-03-18 DIAGNOSIS — L603 Nail dystrophy: Secondary | ICD-10-CM | POA: Diagnosis not present

## 2014-03-18 DIAGNOSIS — E1142 Type 2 diabetes mellitus with diabetic polyneuropathy: Secondary | ICD-10-CM | POA: Diagnosis not present

## 2014-03-18 DIAGNOSIS — L851 Acquired keratosis [keratoderma] palmaris et plantaris: Secondary | ICD-10-CM | POA: Diagnosis not present

## 2014-03-22 IMAGING — CR DG CHEST 2V
2 series · 2 of 2 positions shown · non-contrast
Comparison: 06/13/2012

CLINICAL DATA: Chest pain.  Chest congestion.

CHEST - 2 VIEW

[view not recorded (1 of 2)]
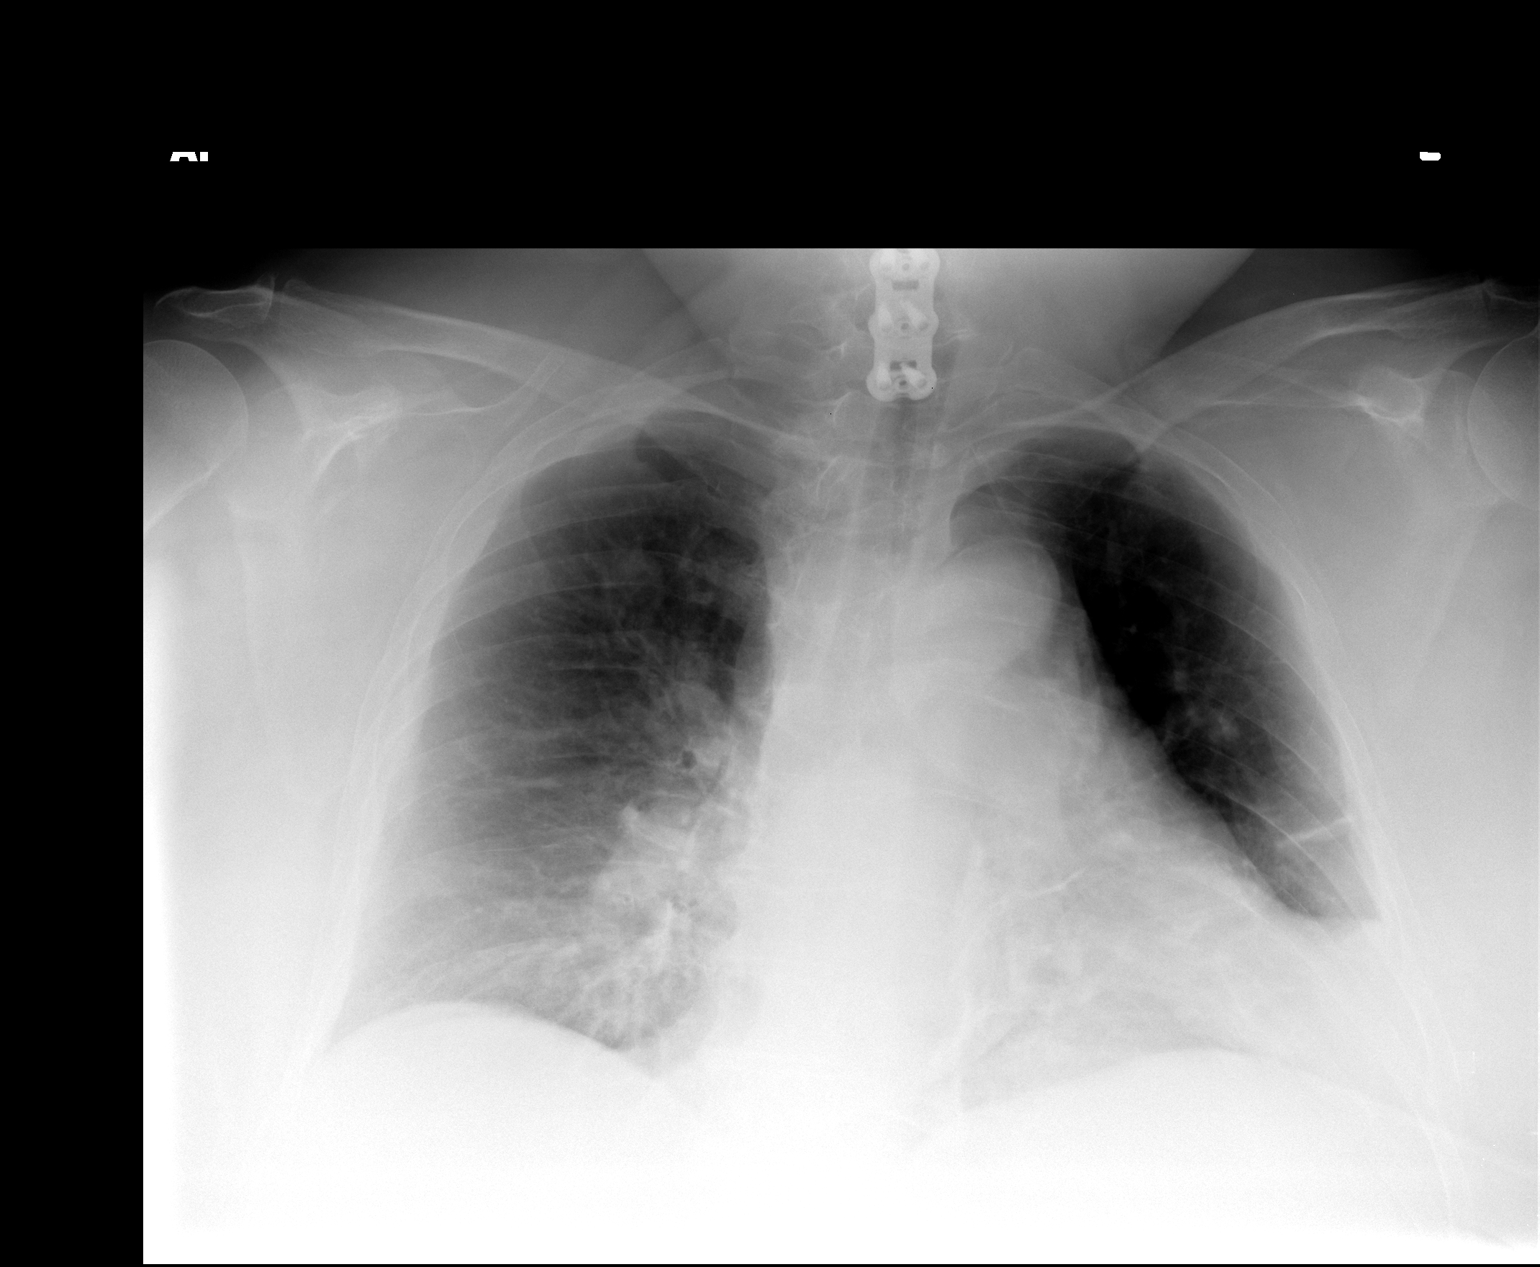

[view not recorded (2 of 2)]
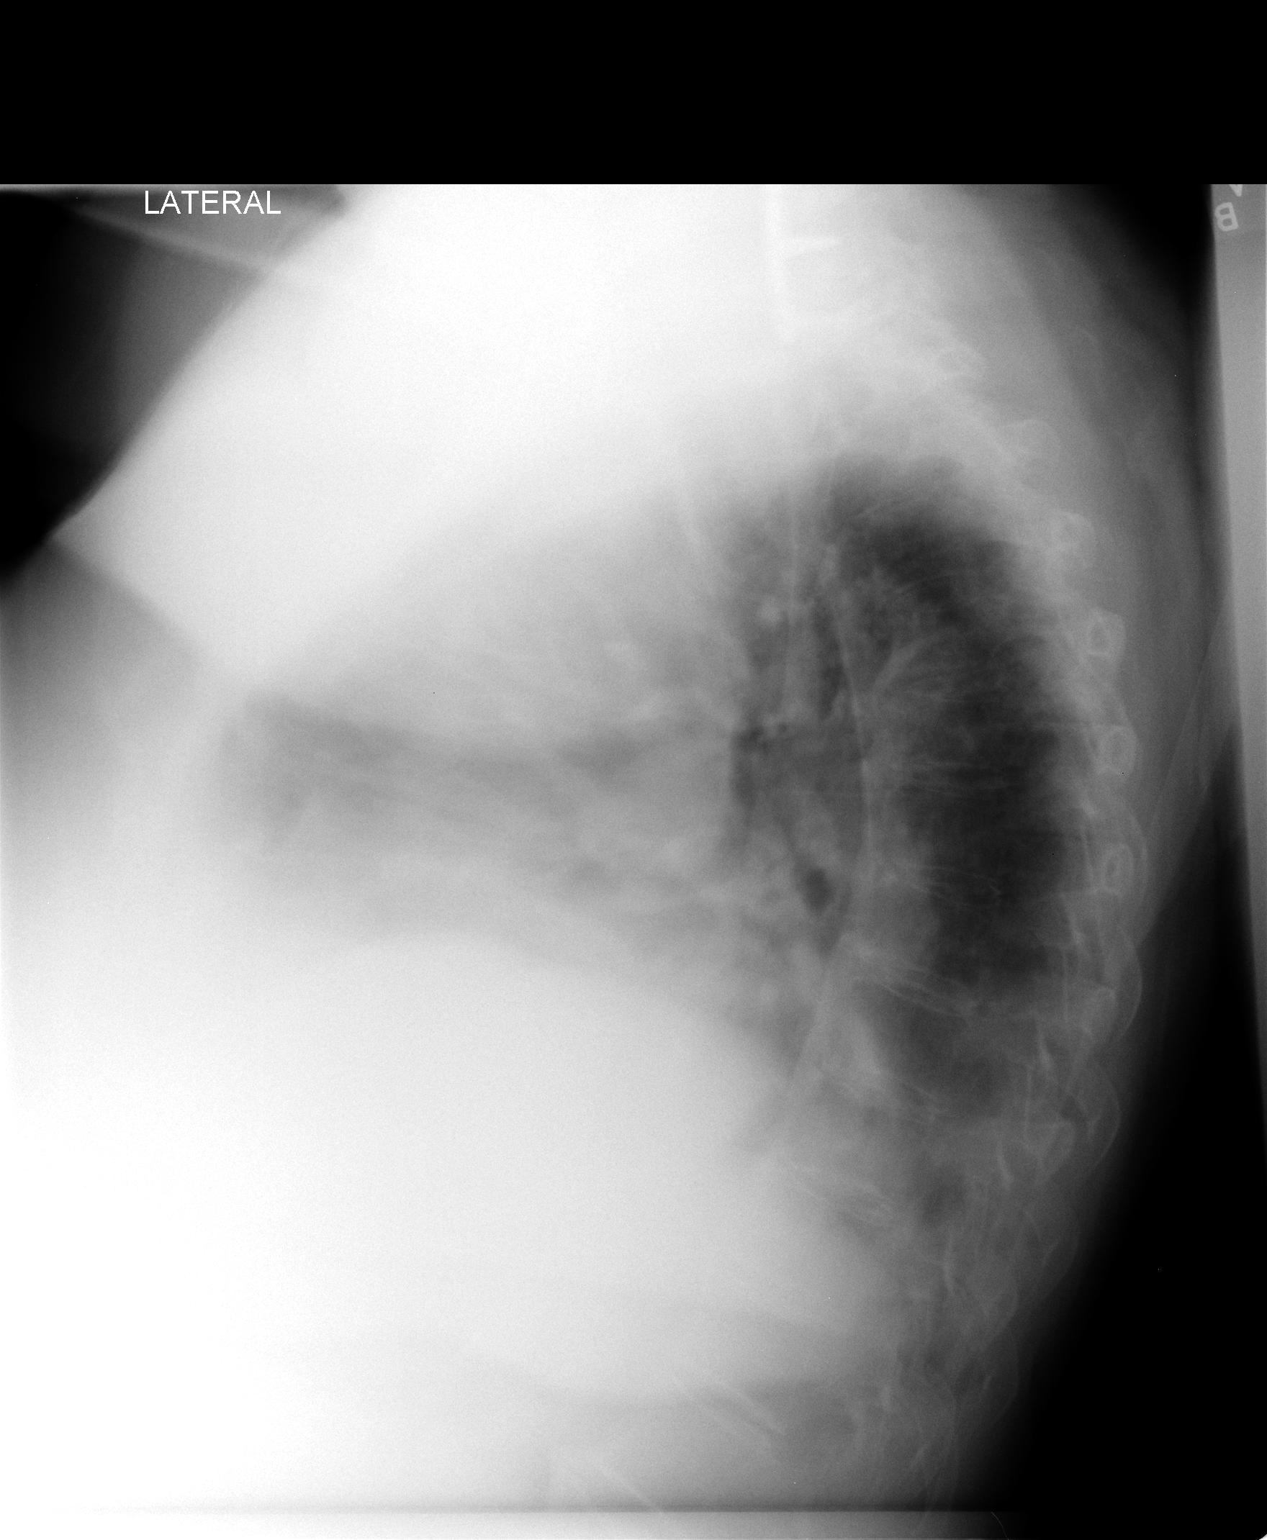

[2 of 2 positions shown; findings below may reference images not displayed]

FINDINGS: The left arm PICC line has been removed since previous
study.  The low lung volumes are again demonstrated.  Left lower
lung atelectasis or scarring is unchanged.  Cardiomegaly stable.
Right lung remains clear.
IMPRESSION: Stable cardiomegaly, low lung volumes, and left lower lung
scarring.  No acute findings.

## 2014-03-22 IMAGING — CR DG ABDOMEN 2V
4 series · 4 of 4 positions shown · non-contrast
Comparison: None.

CLINICAL DATA: Abdominal pain and distention.  Constipation.
Morbid obesity.

ABDOMEN - 2 VIEW

[view not recorded (1 of 4)]
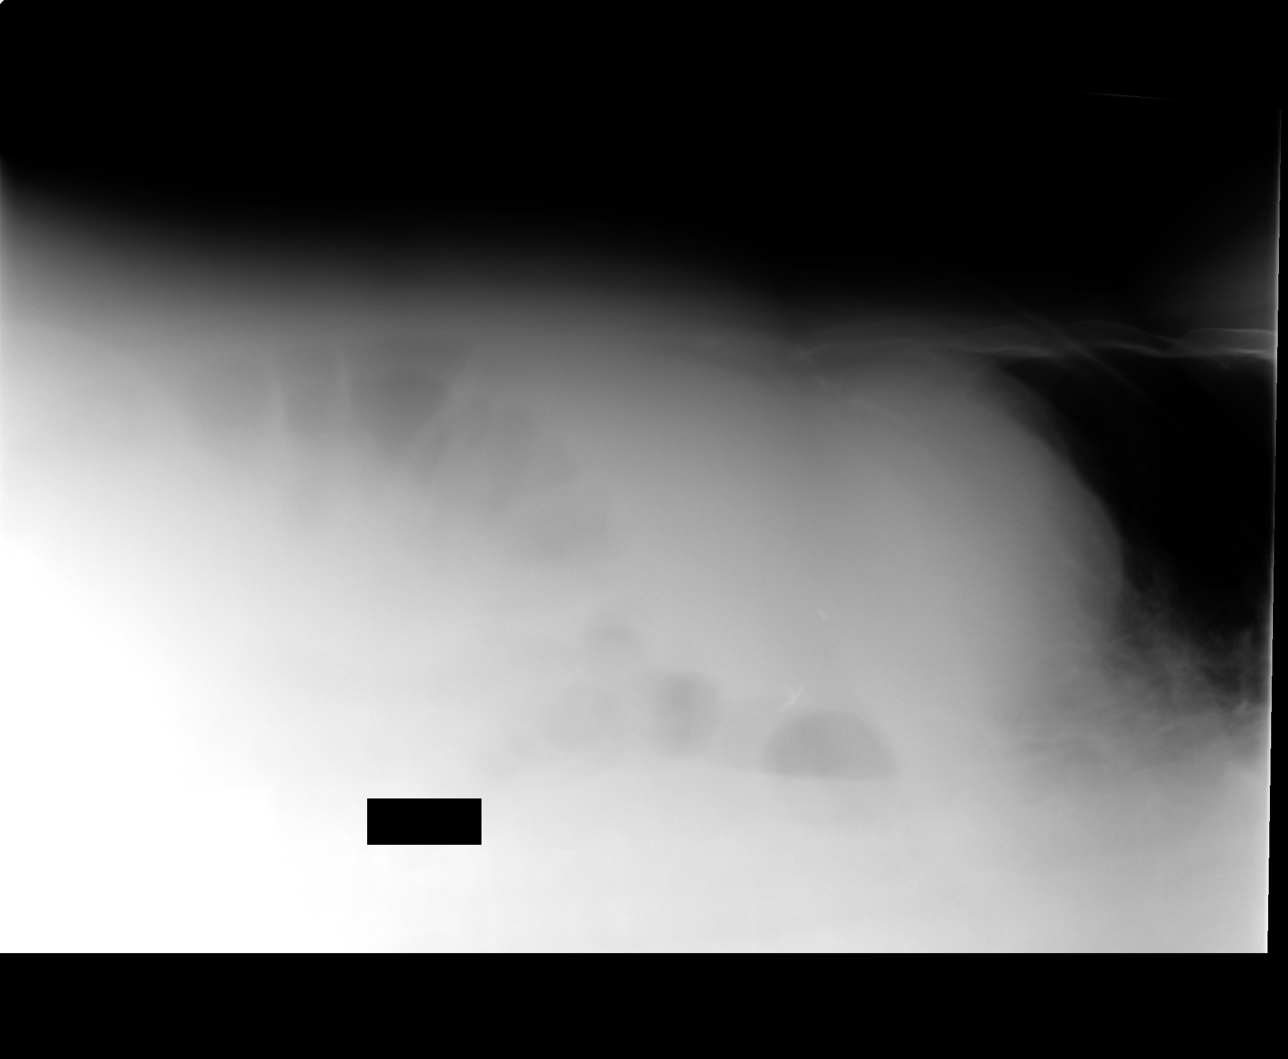

[view not recorded (2 of 4)]
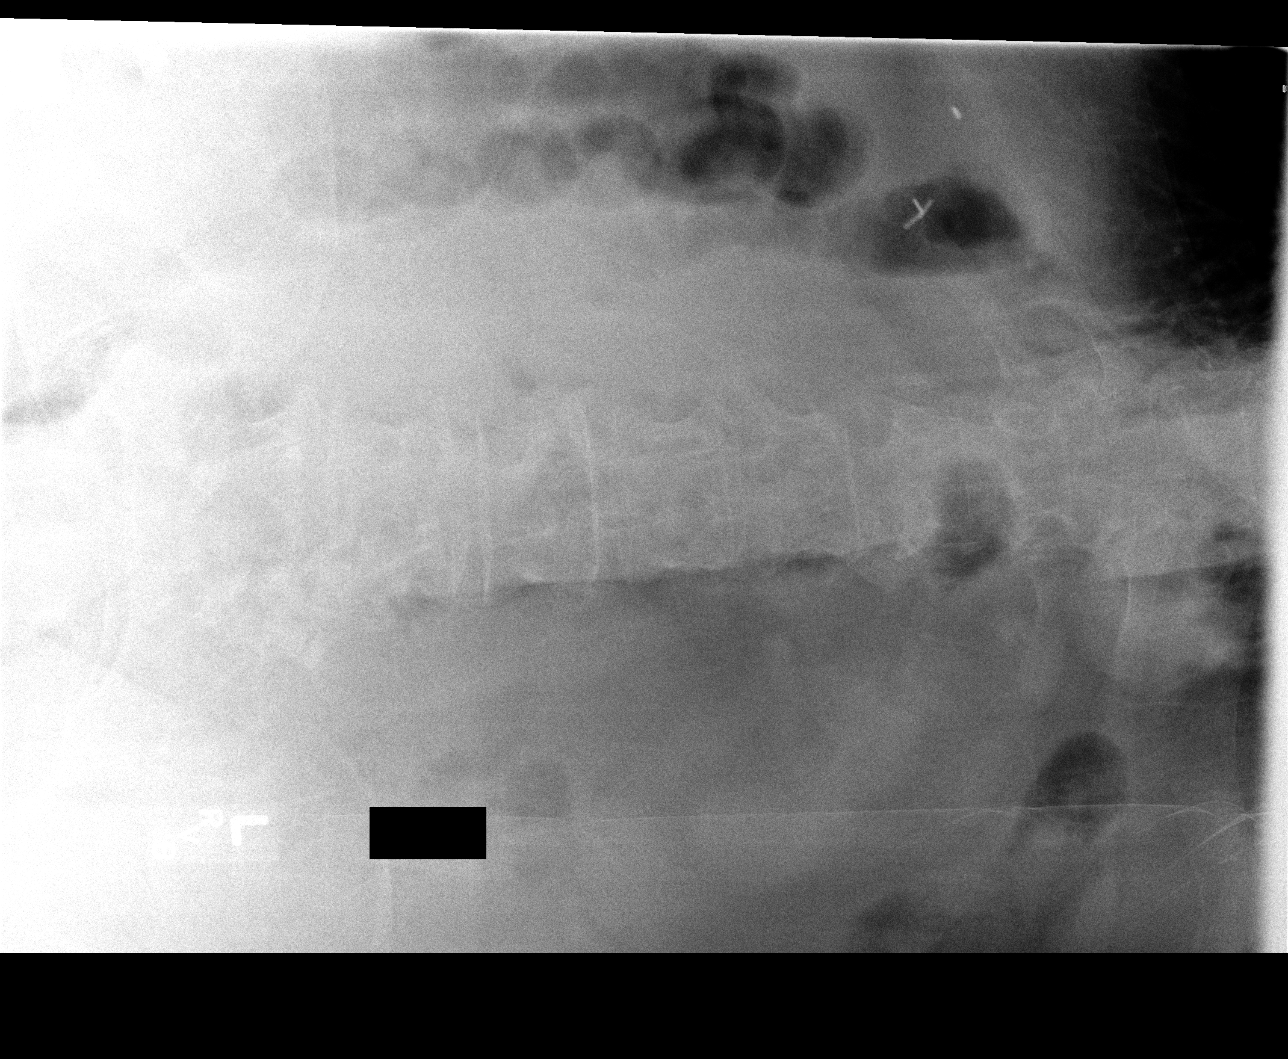

[view not recorded (3 of 4)]
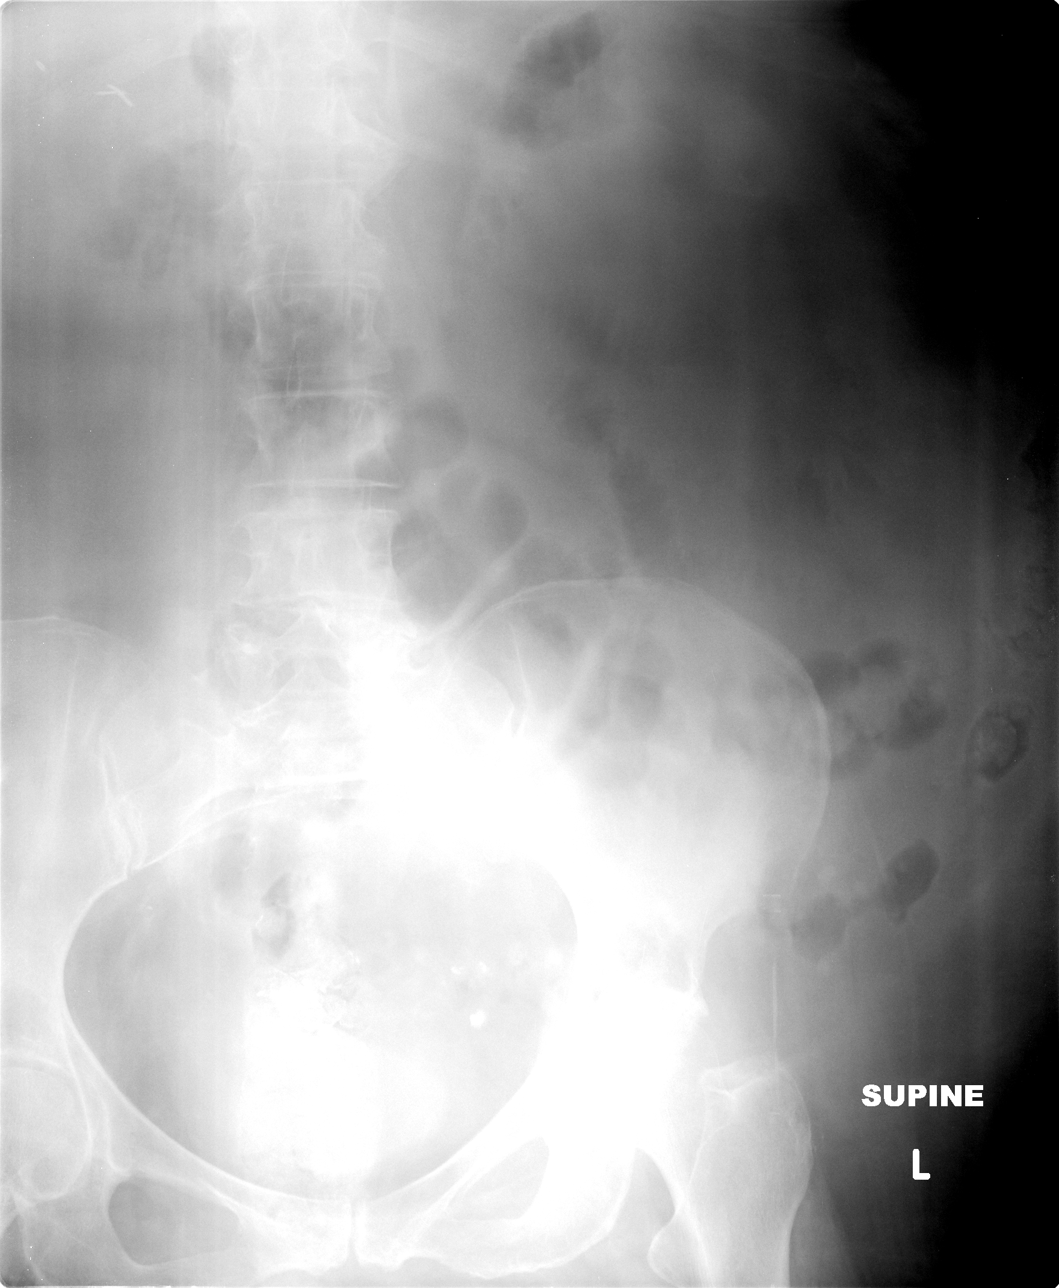

[view not recorded (4 of 4)]
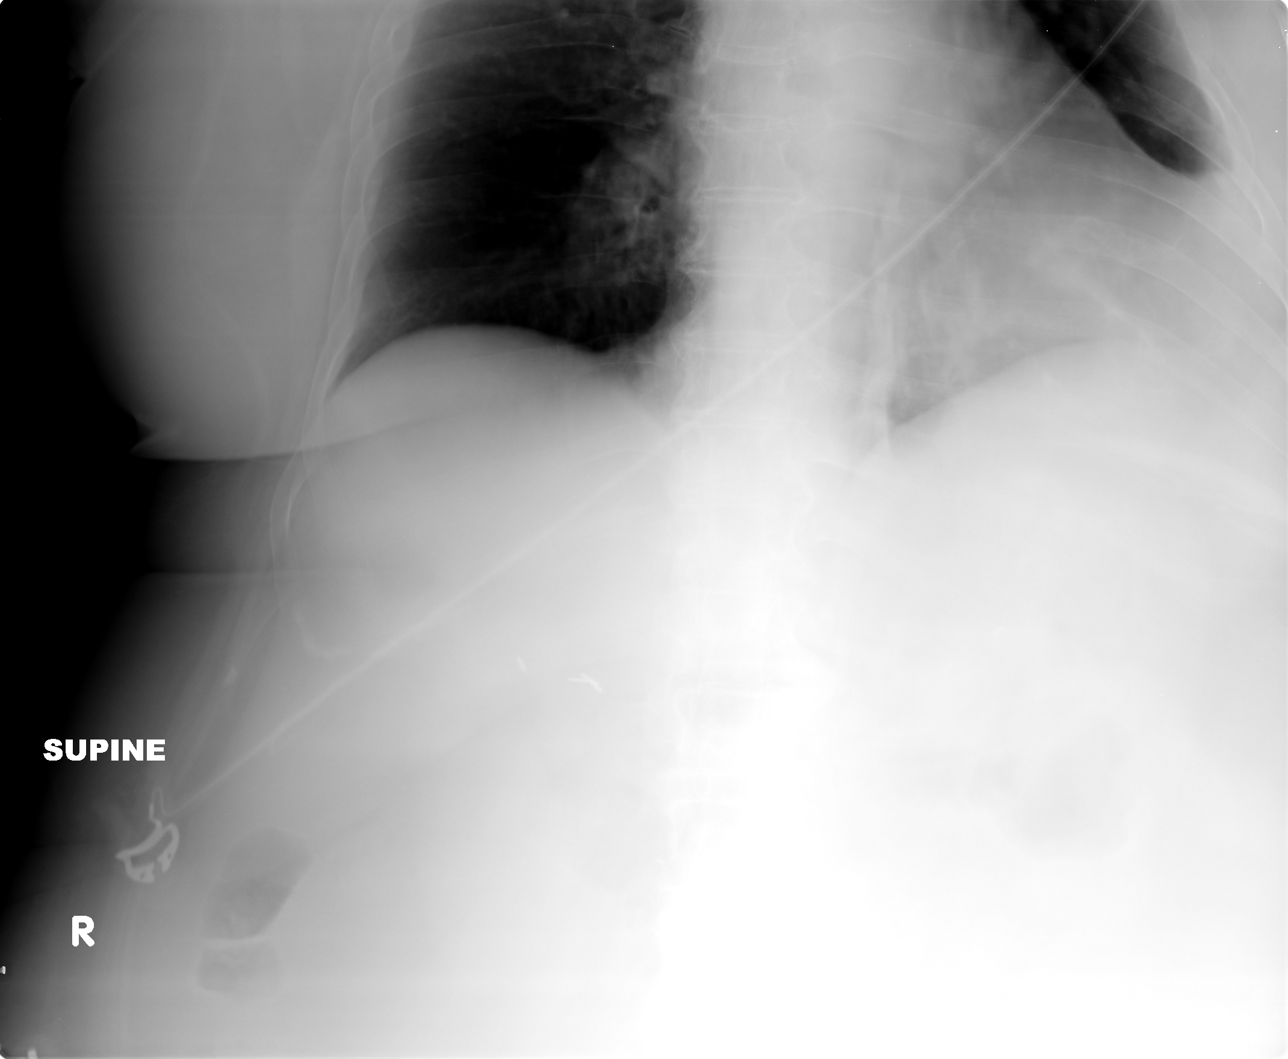

[4 of 4 positions shown; findings below may reference images not displayed]

FINDINGS: Exam is technically suboptimal due to morbid obesity.  No
evidence of dilated bowel loops.  Residual contrast is seen within
diverticula in the sigmoid colon.  No evidence of free air.  Right
upper quadrant surgical clips are seen from prior cholecystectomy.
IMPRESSION: 1.  No acute findings.
2.  Sigmoid diverticulosis.

## 2014-03-22 NOTE — Addendum Note (Signed)
Encounter addended by: Leeroy Cha, PT on: 03/22/2014  4:30 PM<BR>     Documentation filed: Episodes

## 2014-03-28 ENCOUNTER — Encounter: Payer: Self-pay | Admitting: Neurology

## 2014-03-30 ENCOUNTER — Ambulatory Visit (INDEPENDENT_AMBULATORY_CARE_PROVIDER_SITE_OTHER): Payer: Medicare Other | Admitting: Cardiovascular Disease

## 2014-03-30 ENCOUNTER — Encounter: Payer: Self-pay | Admitting: Cardiovascular Disease

## 2014-03-30 VITALS — BP 96/62 | Ht 64.0 in | Wt 228.0 lb

## 2014-03-30 DIAGNOSIS — I10 Essential (primary) hypertension: Secondary | ICD-10-CM | POA: Diagnosis not present

## 2014-03-30 DIAGNOSIS — Z955 Presence of coronary angioplasty implant and graft: Secondary | ICD-10-CM

## 2014-03-30 DIAGNOSIS — E7800 Pure hypercholesterolemia, unspecified: Secondary | ICD-10-CM

## 2014-03-30 DIAGNOSIS — I2510 Atherosclerotic heart disease of native coronary artery without angina pectoris: Secondary | ICD-10-CM | POA: Diagnosis not present

## 2014-03-30 DIAGNOSIS — I5032 Chronic diastolic (congestive) heart failure: Secondary | ICD-10-CM | POA: Diagnosis not present

## 2014-03-30 DIAGNOSIS — E119 Type 2 diabetes mellitus without complications: Secondary | ICD-10-CM

## 2014-03-30 DIAGNOSIS — I480 Paroxysmal atrial fibrillation: Secondary | ICD-10-CM

## 2014-03-30 MED ORDER — AMLODIPINE BESYLATE 5 MG PO TABS: 5.0000 mg | ORAL_TABLET | Freq: Every day | ORAL | Status: DC

## 2014-03-30 MED ORDER — HYDRALAZINE HCL 50 MG PO TABS: 50.0000 mg | ORAL_TABLET | Freq: Three times a day (TID) | ORAL | Status: DC

## 2014-03-30 NOTE — Addendum Note (Signed)
Addended by: Hilarie Fredrickson T on: 03/30/2014 12:17 PM   Modules accepted: Orders

## 2014-03-30 NOTE — Patient Instructions (Signed)
Your physician recommends that you schedule a follow-up appointment in: [place] has recommended you make the following change in your medication:   DECREASE AMLODIPINE TO 5 MG DAILY  HYDRALAZINE 3 TIMES DAILY  Thank you for choosing [facility]!!
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
## 2014-04-11 DIAGNOSIS — J449 Chronic obstructive pulmonary disease, unspecified: Secondary | ICD-10-CM | POA: Diagnosis not present

## 2014-04-11 DIAGNOSIS — J019 Acute sinusitis, unspecified: Secondary | ICD-10-CM | POA: Diagnosis not present

## 2014-04-13 ENCOUNTER — Ambulatory Visit: Payer: Medicare Other | Admitting: Neurology

## 2014-05-09 DIAGNOSIS — E785 Hyperlipidemia, unspecified: Secondary | ICD-10-CM | POA: Diagnosis not present

## 2014-05-09 DIAGNOSIS — E1165 Type 2 diabetes mellitus with hyperglycemia: Secondary | ICD-10-CM | POA: Diagnosis not present

## 2014-05-09 DIAGNOSIS — I10 Essential (primary) hypertension: Secondary | ICD-10-CM | POA: Diagnosis not present

## 2014-05-09 DIAGNOSIS — E038 Other specified hypothyroidism: Secondary | ICD-10-CM | POA: Diagnosis not present

## 2014-05-11 DIAGNOSIS — J209 Acute bronchitis, unspecified: Secondary | ICD-10-CM | POA: Diagnosis not present

## 2014-05-11 DIAGNOSIS — I10 Essential (primary) hypertension: Secondary | ICD-10-CM | POA: Diagnosis not present

## 2014-05-11 DIAGNOSIS — E119 Type 2 diabetes mellitus without complications: Secondary | ICD-10-CM | POA: Diagnosis not present

## 2014-05-11 DIAGNOSIS — J449 Chronic obstructive pulmonary disease, unspecified: Secondary | ICD-10-CM | POA: Diagnosis not present

## 2014-05-16 DIAGNOSIS — I10 Essential (primary) hypertension: Secondary | ICD-10-CM | POA: Diagnosis not present

## 2014-05-16 DIAGNOSIS — E038 Other specified hypothyroidism: Secondary | ICD-10-CM | POA: Diagnosis not present

## 2014-05-16 DIAGNOSIS — E2740 Unspecified adrenocortical insufficiency: Secondary | ICD-10-CM | POA: Diagnosis not present

## 2014-05-16 DIAGNOSIS — E1165 Type 2 diabetes mellitus with hyperglycemia: Secondary | ICD-10-CM | POA: Diagnosis not present

## 2014-06-10 DIAGNOSIS — E1142 Type 2 diabetes mellitus with diabetic polyneuropathy: Secondary | ICD-10-CM | POA: Diagnosis not present

## 2014-06-10 DIAGNOSIS — L603 Nail dystrophy: Secondary | ICD-10-CM | POA: Diagnosis not present

## 2014-06-10 DIAGNOSIS — L851 Acquired keratosis [keratoderma] palmaris et plantaris: Secondary | ICD-10-CM | POA: Diagnosis not present

## 2014-06-15 NOTE — Therapy (Signed)
[facility] [place], [place], [address] Phone: [phone]   Fax:  [phone]  Patient Details  Name: Ann [name] MRN: [id number] Date of Birth: [birth year] Referring Provider:  Marcial Pacas, MD  Encounter Date: 02/16/2014 PHYSICAL THERAPY DISCHARGE SUMMARY  Visits from Start of Care: 7 Current functional level related to goals / functional outcomes: Unknown as pt did not return   Remaining deficits: Unknown as pt has not returned    Education / Equipment: HEP  Plan: Patient agrees to discharge.  Patient goals were not met. Patient is being discharged due to not returning since the last visit.  ?????       RUSSELL,CINDY 06/15/2014, 2:26 PM  [facility], [place], [address] Phone: [phone]   Fax:  [phone]

## 2014-06-15 NOTE — Addendum Note (Signed)
Encounter addended by: Leeroy Cha, PT on: 06/15/2014  2:27 PM<BR>     Documentation filed: Episodes, Clinical Notes

## 2014-06-24 DIAGNOSIS — J449 Chronic obstructive pulmonary disease, unspecified: Secondary | ICD-10-CM | POA: Diagnosis not present

## 2014-06-24 DIAGNOSIS — E1165 Type 2 diabetes mellitus with hyperglycemia: Secondary | ICD-10-CM | POA: Diagnosis not present

## 2014-06-24 DIAGNOSIS — J019 Acute sinusitis, unspecified: Secondary | ICD-10-CM | POA: Diagnosis not present

## 2014-07-07 ENCOUNTER — Encounter: Payer: Medicare Other | Attending: "Endocrinology | Admitting: Nutrition

## 2014-07-07 ENCOUNTER — Encounter: Payer: Self-pay | Admitting: Nutrition

## 2014-07-07 VITALS — Ht 64.0 in | Wt 243.6 lb

## 2014-07-07 DIAGNOSIS — E118 Type 2 diabetes mellitus with unspecified complications: Secondary | ICD-10-CM | POA: Diagnosis not present

## 2014-07-07 DIAGNOSIS — Z794 Long term (current) use of insulin: Secondary | ICD-10-CM | POA: Insufficient documentation

## 2014-07-07 DIAGNOSIS — Z713 Dietary counseling and surveillance: Secondary | ICD-10-CM | POA: Insufficient documentation

## 2014-07-07 DIAGNOSIS — E1165 Type 2 diabetes mellitus with hyperglycemia: Secondary | ICD-10-CM

## 2014-07-07 DIAGNOSIS — Z6841 Body Mass Index (BMI) 40.0 and over, adult: Secondary | ICD-10-CM | POA: Insufficient documentation

## 2014-07-07 DIAGNOSIS — IMO0002 Reserved for concepts with insufficient information to code with codable children: Secondary | ICD-10-CM

## 2014-07-07 NOTE — Patient Instructions (Signed)
Goals:  Follow Diabetes Meal Plan/The Plate Method as instructed  Eat 3 balanced meals per day   Do not skip meals  Cut out processed meats and junk food  Increase fresh fruits and vegetables.  Measure foods out for portion accuracy.  Increase water intake  Limit carbohydrate intake to 30-45 grams carbohydrate/meal  Add lean protein foods to meals  Monitor glucose levels in am and pm daily and record on BS Log.  Keep a food journal and bring at next visit.  Aim for 15 mins of physical activity daily  Bring food record and glucose log to your next nutrition visit  Lose 1 lb per week.

## 2014-07-07 NOTE — Progress Notes (Signed)
  Medical Nutrition Therapy:  Appt start time: 1330 end time:  1330.  Assessment:  Primary concerns today: DIabetes. Lives with her husband.Her husband does the shopping and cooking. Limted mobility due to walking issues. Most foods baked, broiled and fried.      Metformin 1000 mg BID. Not taking Levemir anymore. Testing BS 2 hrs after breakfast: 234 mg/cdl.    Having blurry vision. BS are elevated since her isn't taking Levemir anymore within the last three months. Her appetite has been extreme. She is hungry all the time. Has gained weight since last MD visit. Diet is high in processed foods, salt, fat and low in fresh fruits and vegetables.    Doesn't have any upper teeth, but able to eat most foods. Walking is very limited. Walks with a walker. Short of breath just walking short distances.     Most recent A1C three months ago was 7%.  She is willing to make changes in her diet to improve blood sugar and lose weight.   Preferred Learning Style:   Auditory  Hands on   Learning Readiness:   Not ready  Contemplating  Ready  Change in progress   MEDICATIONS: see list   DIETARY INTAKE:  24-hr recall:  B ( AM): Banana sandwich- 1 banana and 4 slices bread. Water. Snk ( AM): none  L ( PM): Spaghetti with 2 slices, water Snk ( PM): 1 slim jim D ( PM): [place] sausages and crackers 7, water Snack ( PM): none Beverages: water  Usual physical activity: limited due to walking  Estimated energy needs: 1500 calories 170 g carbohydrates 12 g protein 42 g fat  Progress Towards Goal(s):  In progress.   Nutritional Diagnosis:  NB-1.1 Food and nutrition-related knowledge deficit As related to Diabetes.  As evidenced by A1C 7%..    Intervention:  Nutrition counseling and diabetes education on low fat low sodium high fiber diet, CHO Counting, meal planning, target ranges for blood sugars and treatment of hyper/hypoglycemia and risk for complications.  Goals:  Follow Diabetes  Meal Plan/The Plate Method as instructed  Eat 3 balanced meals per day   Do not skip meals  Cut out processed meats and junk food  Increase fresh fruits and vegetables.  Measure foods out for portion accuracy.  Increase water intake  Limit carbohydrate intake to 30-45 grams carbohydrate/meal  Add lean protein foods to meals  Monitor glucose levels in am and pm daily and record on BS Log.  Keep a food journal and bring at next visit.  Aim for 15 mins of physical activity daily  Bring food record and glucose log to your next nutrition visit  Lose 1 lb per week.  Teaching Method Utilized:  Visual Auditory Hands on  Handouts given during visit include: The Plate Method Carb Counting and Food Label handouts Meal Plan Card  Barriers to learning/adherence to lifestyle change: none  Demonstrated degree of understanding via:  Teach Back   Monitoring/Evaluation:  Dietary intake, exercise, meal planning, SBG, and body weight in 1 month.

## 2014-07-12 DIAGNOSIS — E1140 Type 2 diabetes mellitus with diabetic neuropathy, unspecified: Secondary | ICD-10-CM | POA: Diagnosis not present

## 2014-07-12 DIAGNOSIS — J449 Chronic obstructive pulmonary disease, unspecified: Secondary | ICD-10-CM | POA: Diagnosis not present

## 2014-07-12 DIAGNOSIS — T7840XA Allergy, unspecified, initial encounter: Secondary | ICD-10-CM | POA: Diagnosis not present

## 2014-07-12 DIAGNOSIS — I10 Essential (primary) hypertension: Secondary | ICD-10-CM | POA: Diagnosis not present

## 2014-08-09 DIAGNOSIS — I10 Essential (primary) hypertension: Secondary | ICD-10-CM | POA: Diagnosis not present

## 2014-08-09 DIAGNOSIS — Z713 Dietary counseling and surveillance: Secondary | ICD-10-CM | POA: Diagnosis not present

## 2014-08-09 DIAGNOSIS — E1165 Type 2 diabetes mellitus with hyperglycemia: Secondary | ICD-10-CM | POA: Diagnosis not present

## 2014-08-09 DIAGNOSIS — E038 Other specified hypothyroidism: Secondary | ICD-10-CM | POA: Diagnosis not present

## 2014-08-09 DIAGNOSIS — E782 Mixed hyperlipidemia: Secondary | ICD-10-CM | POA: Diagnosis not present

## 2014-08-17 ENCOUNTER — Encounter: Payer: Medicare Other | Attending: "Endocrinology | Admitting: Nutrition

## 2014-08-17 VITALS — Ht 64.0 in | Wt 235.0 lb

## 2014-08-17 DIAGNOSIS — E038 Other specified hypothyroidism: Secondary | ICD-10-CM | POA: Diagnosis not present

## 2014-08-17 DIAGNOSIS — E1165 Type 2 diabetes mellitus with hyperglycemia: Secondary | ICD-10-CM | POA: Diagnosis not present

## 2014-08-17 DIAGNOSIS — E782 Mixed hyperlipidemia: Secondary | ICD-10-CM | POA: Diagnosis not present

## 2014-08-17 DIAGNOSIS — E669 Obesity, unspecified: Secondary | ICD-10-CM | POA: Insufficient documentation

## 2014-08-17 DIAGNOSIS — E2740 Unspecified adrenocortical insufficiency: Secondary | ICD-10-CM | POA: Diagnosis not present

## 2014-08-17 DIAGNOSIS — E118 Type 2 diabetes mellitus with unspecified complications: Secondary | ICD-10-CM | POA: Diagnosis not present

## 2014-08-17 DIAGNOSIS — Z794 Long term (current) use of insulin: Secondary | ICD-10-CM | POA: Insufficient documentation

## 2014-08-17 DIAGNOSIS — Z713 Dietary counseling and surveillance: Secondary | ICD-10-CM | POA: Diagnosis not present

## 2014-08-17 DIAGNOSIS — IMO0002 Reserved for concepts with insufficient information to code with codable children: Secondary | ICD-10-CM

## 2014-08-17 DIAGNOSIS — Z6841 Body Mass Index (BMI) 40.0 and over, adult: Secondary | ICD-10-CM | POA: Diagnosis not present

## 2014-08-17 DIAGNOSIS — I10 Essential (primary) hypertension: Secondary | ICD-10-CM | POA: Diagnosis not present

## 2014-08-17 NOTE — Progress Notes (Signed)
  Medical Nutrition Therapy:  Appt start time: 8299 end time:  1115 Assessment:  Primary concerns today: DIabetes. Lives with her husband.Her husband does the shopping and cooking. Limted mobility due to walking issues. Most foods baked, broiled and fried.  Most recent A1C was 7% same as it was three months ago without insulin. Still not taking insulin.   Metformin 1000 mg BID.  Food journal brought in and shows that she is eating a lot of baked potatoes and needs more lower carb vegetables and fresh fruit with meals. Drinks a lot of water. She lost 8 lbs since last visit.     Preferred Learning Style:   Auditory  Hands on   Learning Readiness:   Not ready  Contemplating  Ready  Change in progress   MEDICATIONS: see list   DIETARY INTAKE:  24-hr recall:  B ( AM): 1 Boiled egg, 1/2 c oatmeal, water Snk ( AM): none  L ( PM): Chicken salad sandwich, water Snk ( PM): D ( PM): Fish, baked potato, orange Snack ( PM): none Beverages: water  Usual physical activity: limited due to walking  Estimated energy needs: 1500 calories 170 g carbohydrates 12 g protein 42 g fat  Progress Towards Goal(s):  In progress.   Nutritional Diagnosis:  NB-1.1 Food and nutrition-related knowledge deficit As related to Diabetes.  As evidenced by A1C 7%..    Intervention:  Nutrition counseling and diabetes education on low fat low sodium high fiber diet, meal planning and CHO counting.  Goals:  Follow Diabetes Meal Plan/The Plate Method as instructed  Cut out baked potatoes  Increase fresh fruit and low carb vegetables with meals.  Avoid fried and fast foods.  Lose 1 lb per week til next visit.  Get A1C down to 6.5%  Teaching Method Utilized:  Visual Auditory Hands on  Handouts given during visit include: The Plate Method Carb Counting and Food Label handouts Meal Plan Card  Barriers to learning/adherence to lifestyle change: none  Demonstrated degree of understanding  via:  Teach Back   Monitoring/Evaluation:  Dietary intake, exercise, meal planning, SBG, and body weight in 1 month.

## 2014-08-17 NOTE — Patient Instructions (Signed)
Goals:  Follow Diabetes Meal Plan/The Plate Method as instructed  Cut out baked potatoes  Choose 1/2 sweet potato if you want a potato.  Increase fresh fruit and low carb vegetables with meals.  Avoid fried and fast foods.  Lose 1 lb per week til next visit.  Get A1C down to 6.5%

## 2014-08-19 DIAGNOSIS — L603 Nail dystrophy: Secondary | ICD-10-CM | POA: Diagnosis not present

## 2014-08-19 DIAGNOSIS — L851 Acquired keratosis [keratoderma] palmaris et plantaris: Secondary | ICD-10-CM | POA: Diagnosis not present

## 2014-08-19 DIAGNOSIS — E1142 Type 2 diabetes mellitus with diabetic polyneuropathy: Secondary | ICD-10-CM | POA: Diagnosis not present

## 2014-09-06 ENCOUNTER — Encounter: Payer: Self-pay | Admitting: Licensed Clinical Social Worker

## 2014-09-06 NOTE — Patient Outreach (Signed)
[facility]) Care Management  [facility] Social Work  09/06/2014  DEMETRI KERMAN [birth year] [id number]  Subjective:    Objective:   Current Medications:  Current Outpatient Prescriptions  Medication Sig Dispense Refill  . albuterol (PROVENTIL HFA;VENTOLIN HFA) 108 (90 BASE) MCG/ACT inhaler Inhale 2 puffs into the lungs every 4 (four) hours as needed for wheezing.    Marland Kitchen ALPRAZolam (XANAX) 0.5 MG tablet Take 1 tablet (0.5 mg total) by mouth 3 (three) times daily as needed for sleep.  0  . amLODipine (NORVASC) 5 MG tablet Take 1 tablet (5 mg total) by mouth daily. 30 tablet 3  . aspirin EC 81 MG tablet Take 81 mg by mouth daily.    Marland Kitchen atorvastatin (LIPITOR) 80 MG tablet Take 1 tablet (80 mg total) by mouth daily at 6 PM. (Patient not taking: Reported on 07/07/2014) 30 tablet 12  . carvedilol (COREG) 6.25 MG tablet Take 6.25 mg by mouth 2 (two) times daily with a meal.    . diltiazem (CARDIZEM) 120 MG tablet Take 120 mg by mouth daily.    . furosemide (LASIX) 40 MG tablet Take 40 mg by mouth daily.    Marland Kitchen gabapentin (NEURONTIN) 100 MG capsule Take 1 capsule (100 mg total) by mouth 3 (three) times daily.    . hydrALAZINE (APRESOLINE) 50 MG tablet Take 1 tablet (50 mg total) by mouth 3 (three) times daily. (Patient not taking: Reported on 07/07/2014) 120 tablet 12  . hydroxypropyl methylcellulose (ISOPTO TEARS) 2.5 % ophthalmic solution Place 1 drop into both eyes 3 (three) times daily as needed (dry eyes).    . insulin detemir (LEVEMIR) 100 UNIT/ML injection Inject 15 Units into the skin at bedtime.    Marland Kitchen levothyroxine (SYNTHROID, LEVOTHROID) 200 MCG tablet Take 150 mcg by mouth daily before breakfast. 150 mcg one tablet by mouth daily.    . metFORMIN (GLUMETZA) 500 MG (MOD) 24 hr tablet 500 mg 2 (two) times daily with a meal.    . metoCLOPramide (REGLAN) 10 MG tablet Take 1 tablet (10 mg total) by mouth 4 (four) times daily -  before meals and at bedtime. (Patient not taking: Reported  on 07/07/2014)    . nitroGLYCERIN (NITROSTAT) 0.4 MG SL tablet Place 1 tablet (0.4 mg total) under the tongue every 5 (five) minutes as needed for chest pain. (Patient not taking: Reported on 07/07/2014)  12  . potassium chloride (KLOR-CON M15) 15 MEQ tablet Take 2 tablets (30 mEq total) by mouth 2 (two) times daily. 60 tablet 12  . predniSONE (DELTASONE) 10 MG tablet Take 10 mg by mouth daily.    . sertraline (ZOLOFT) 100 MG tablet Take 0.5 tablets (50 mg total) by mouth at bedtime. 30 tablet 11  . sertraline (ZOLOFT) 100 MG tablet Take 1 tablet (100 mg total) by mouth at bedtime. 30 tablet 11  . tolterodine (DETROL) 2 MG tablet Take 2 mg by mouth 2 (two) times daily.    . vitamin B-12 1000 MCG tablet Take 1 tablet (1,000 mcg total) by mouth daily.     No current facility-administered medications for this visit.    Functional Status:  No flowsheet data found.  Fall/Depression Screening:  PHQ 2/9 Scores 07/07/2014  PHQ - 2 Score 1    Assessment: Previous documentation in [facility] record.  CSW completed chart review on client on 09/06/14. Client continues to see Dr. Luan Pulling, her primary doctor for appointments as scheduled. Client has been meeting monthly for the past two months with  Jearld Fenton, registered dietician, to discuss diet habits of client and healthy food intake for client.  Client has reported that she is functioning adequately in home environment with the help of her spouse and other family members. She has prescribed medications and has transportation to scheduled medical appointments.  Due to the fact that her social work needs are met at present, [facility] is discharging client from [facility] work services on 09/06/14.    Plan: CSW to communicate with Lurline Del on 09/06/14 to inform Lurline Del that [facility] discharged client on 09/06/14. CSW to send case closure physician letter to Dr. Luan Pulling on 09/06/14. CSW to complete case closure patient letter and route letter to Lurline Del  for Lattie Haw to mail letter to client.   Norva Riffle.Laqueta Bonaventura MSW, LCSW Licensed Clinical Social Worker [facility] Care Management [phone]

## 2014-09-07 ENCOUNTER — Encounter: Payer: Self-pay | Admitting: Licensed Clinical Social Worker

## 2014-09-09 NOTE — Patient Outreach (Signed)
[facility]) Care Management  09/09/2014  Ann [name] [birth year] [id number]   Received notification from Theadore Nan, [place] to close case as patient has reached her goals with [facility] Care Management.  Case closed at this time.  Ronnell Freshwater. [facility] CM Assistant Phone: [phone] Fax: [phone]

## 2014-09-24 ENCOUNTER — Emergency Department (HOSPITAL_COMMUNITY): Payer: Medicare Other

## 2014-09-24 ENCOUNTER — Encounter (HOSPITAL_COMMUNITY): Payer: Self-pay | Admitting: Emergency Medicine

## 2014-09-24 ENCOUNTER — Emergency Department (HOSPITAL_COMMUNITY)
Admission: EM | Admit: 2014-09-24 | Discharge: 2014-09-24 | Disposition: A | Discharge status: Home / Self Care | Payer: Medicare Other | Attending: Emergency Medicine | Admitting: Emergency Medicine

## 2014-09-24 DIAGNOSIS — Z86018 Personal history of other benign neoplasm: Secondary | ICD-10-CM | POA: Insufficient documentation

## 2014-09-24 DIAGNOSIS — Z87828 Personal history of other (healed) physical injury and trauma: Secondary | ICD-10-CM | POA: Insufficient documentation

## 2014-09-24 DIAGNOSIS — G629 Polyneuropathy, unspecified: Secondary | ICD-10-CM

## 2014-09-24 DIAGNOSIS — M1990 Unspecified osteoarthritis, unspecified site: Secondary | ICD-10-CM | POA: Diagnosis not present

## 2014-09-24 DIAGNOSIS — E039 Hypothyroidism, unspecified: Secondary | ICD-10-CM | POA: Diagnosis not present

## 2014-09-24 DIAGNOSIS — R202 Paresthesia of skin: Secondary | ICD-10-CM | POA: Diagnosis not present

## 2014-09-24 DIAGNOSIS — E1142 Type 2 diabetes mellitus with diabetic polyneuropathy: Secondary | ICD-10-CM | POA: Diagnosis not present

## 2014-09-24 DIAGNOSIS — Z7982 Long term (current) use of aspirin: Secondary | ICD-10-CM | POA: Diagnosis not present

## 2014-09-24 DIAGNOSIS — Z872 Personal history of diseases of the skin and subcutaneous tissue: Secondary | ICD-10-CM | POA: Insufficient documentation

## 2014-09-24 DIAGNOSIS — Z8619 Personal history of other infectious and parasitic diseases: Secondary | ICD-10-CM | POA: Diagnosis not present

## 2014-09-24 DIAGNOSIS — F419 Anxiety disorder, unspecified: Secondary | ICD-10-CM | POA: Insufficient documentation

## 2014-09-24 DIAGNOSIS — G43911 Migraine, unspecified, intractable, with status migrainosus: Secondary | ICD-10-CM | POA: Diagnosis not present

## 2014-09-24 DIAGNOSIS — Z7901 Long term (current) use of anticoagulants: Secondary | ICD-10-CM | POA: Diagnosis not present

## 2014-09-24 DIAGNOSIS — J449 Chronic obstructive pulmonary disease, unspecified: Secondary | ICD-10-CM | POA: Diagnosis not present

## 2014-09-24 DIAGNOSIS — E785 Hyperlipidemia, unspecified: Secondary | ICD-10-CM | POA: Insufficient documentation

## 2014-09-24 DIAGNOSIS — I10 Essential (primary) hypertension: Secondary | ICD-10-CM | POA: Insufficient documentation

## 2014-09-24 DIAGNOSIS — I5032 Chronic diastolic (congestive) heart failure: Secondary | ICD-10-CM | POA: Insufficient documentation

## 2014-09-24 DIAGNOSIS — R51 Headache: Secondary | ICD-10-CM | POA: Diagnosis present

## 2014-09-24 DIAGNOSIS — G8929 Other chronic pain: Secondary | ICD-10-CM | POA: Insufficient documentation

## 2014-09-24 DIAGNOSIS — E876 Hypokalemia: Secondary | ICD-10-CM | POA: Insufficient documentation

## 2014-09-24 DIAGNOSIS — I2510 Atherosclerotic heart disease of native coronary artery without angina pectoris: Secondary | ICD-10-CM | POA: Insufficient documentation

## 2014-09-24 DIAGNOSIS — R519 Headache, unspecified: Secondary | ICD-10-CM

## 2014-09-24 DIAGNOSIS — Z86718 Personal history of other venous thrombosis and embolism: Secondary | ICD-10-CM | POA: Insufficient documentation

## 2014-09-24 DIAGNOSIS — Z794 Long term (current) use of insulin: Secondary | ICD-10-CM | POA: Insufficient documentation

## 2014-09-24 DIAGNOSIS — Z87891 Personal history of nicotine dependence: Secondary | ICD-10-CM | POA: Insufficient documentation

## 2014-09-24 DIAGNOSIS — Z79899 Other long term (current) drug therapy: Secondary | ICD-10-CM | POA: Insufficient documentation

## 2014-09-24 LAB — CBC WITH DIFFERENTIAL/PLATELET
BASOS PCT: 0 % (ref 0–1)
Basophils Absolute: 0 10*3/uL (ref 0.0–0.1)
EOS ABS: 0.2 10*3/uL (ref 0.0–0.7)
Eosinophils Relative: 2 % (ref 0–5)
HCT: 40 % (ref 36.0–46.0)
Hemoglobin: 12.6 g/dL (ref 12.0–15.0)
Lymphocytes Relative: 24 % (ref 12–46)
Lymphs Abs: 2.4 10*3/uL (ref 0.7–4.0)
MCH: 29 pg (ref 26.0–34.0)
MCHC: 31.5 g/dL (ref 30.0–36.0)
MCV: 92 fL (ref 78.0–100.0)
Monocytes Absolute: 0.8 10*3/uL (ref 0.1–1.0)
Monocytes Relative: 8 % (ref 3–12)
NEUTROS PCT: 66 % (ref 43–77)
Neutro Abs: 6.4 10*3/uL (ref 1.7–7.7)
Platelets: 238 10*3/uL (ref 150–400)
RBC: 4.35 MIL/uL (ref 3.87–5.11)
RDW: 13.4 % (ref 11.5–15.5)
WBC: 9.8 10*3/uL (ref 4.0–10.5)

## 2014-09-24 LAB — BASIC METABOLIC PANEL
Anion gap: 7 (ref 5–15)
BUN: 20 mg/dL (ref 6–23)
CALCIUM: 9.1 mg/dL (ref 8.4–10.5)
CO2: 26 mmol/L (ref 19–32)
Chloride: 107 mmol/L (ref 96–112)
Creatinine, Ser: 0.84 mg/dL (ref 0.50–1.10)
GFR, EST AFRICAN AMERICAN: 80 mL/min — AB (ref >90)
GFR, EST NON AFRICAN AMERICAN: 69 mL/min — AB (ref >90)
Glucose, Bld: 106 mg/dL — ABNORMAL HIGH (ref 70–99)
POTASSIUM: 4.6 mmol/L (ref 3.5–5.1)
Sodium: 140 mmol/L (ref 135–145)

## 2014-09-24 MED ORDER — HYDROCODONE-ACETAMINOPHEN 5-325 MG PO TABS: 1.0000 | ORAL_TABLET | Freq: Four times a day (QID) | ORAL | Status: DC | PRN

## 2014-09-24 NOTE — Discharge Instructions (Signed)
Make appointment to follow-up with Dr. Luan Pulling. Return for any new or worse symptoms. Take the pain medicine as directed.

## 2014-09-24 NOTE — ED Notes (Addendum)
PT c/o headache x3 days with sinus congestion x1 week. PT states she took an Excedrin migraine yesterday with no relief. PT is diabetic and c/o painful tingling in right hand only x1 day.
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
## 2014-10-10 DIAGNOSIS — E1140 Type 2 diabetes mellitus with diabetic neuropathy, unspecified: Secondary | ICD-10-CM | POA: Diagnosis not present

## 2014-10-10 DIAGNOSIS — I2510 Atherosclerotic heart disease of native coronary artery without angina pectoris: Secondary | ICD-10-CM | POA: Diagnosis not present

## 2014-10-10 DIAGNOSIS — J449 Chronic obstructive pulmonary disease, unspecified: Secondary | ICD-10-CM | POA: Diagnosis not present

## 2014-10-10 DIAGNOSIS — E279 Disorder of adrenal gland, unspecified: Secondary | ICD-10-CM | POA: Diagnosis not present

## 2014-11-09 DIAGNOSIS — E038 Other specified hypothyroidism: Secondary | ICD-10-CM | POA: Diagnosis not present

## 2014-11-09 DIAGNOSIS — E2740 Unspecified adrenocortical insufficiency: Secondary | ICD-10-CM | POA: Diagnosis not present

## 2014-11-09 DIAGNOSIS — E1165 Type 2 diabetes mellitus with hyperglycemia: Secondary | ICD-10-CM | POA: Diagnosis not present

## 2014-11-09 LAB — HEMOGLOBIN A1C: Hgb A1c MFr Bld: 7.9 % — AB (ref 4.0–6.0)

## 2014-11-11 DIAGNOSIS — E1142 Type 2 diabetes mellitus with diabetic polyneuropathy: Secondary | ICD-10-CM | POA: Diagnosis not present

## 2014-11-11 DIAGNOSIS — L851 Acquired keratosis [keratoderma] palmaris et plantaris: Secondary | ICD-10-CM | POA: Diagnosis not present

## 2014-11-11 DIAGNOSIS — L603 Nail dystrophy: Secondary | ICD-10-CM | POA: Diagnosis not present

## 2014-11-17 DIAGNOSIS — E039 Hypothyroidism, unspecified: Secondary | ICD-10-CM | POA: Diagnosis not present

## 2014-11-17 DIAGNOSIS — E1165 Type 2 diabetes mellitus with hyperglycemia: Secondary | ICD-10-CM | POA: Diagnosis not present

## 2014-11-17 DIAGNOSIS — E782 Mixed hyperlipidemia: Secondary | ICD-10-CM | POA: Diagnosis not present

## 2014-11-17 DIAGNOSIS — E2740 Unspecified adrenocortical insufficiency: Secondary | ICD-10-CM | POA: Diagnosis not present

## 2014-11-21 ENCOUNTER — Other Ambulatory Visit: Payer: Self-pay

## 2014-11-24 ENCOUNTER — Ambulatory Visit: Payer: Medicare Other | Admitting: Nutrition

## 2014-11-25 ENCOUNTER — Encounter: Payer: Self-pay | Admitting: Gastroenterology

## 2014-12-02 ENCOUNTER — Encounter: Payer: Medicare Other | Attending: "Endocrinology | Admitting: Nutrition

## 2014-12-02 VITALS — Wt 248.0 lb

## 2014-12-02 DIAGNOSIS — IMO0002 Reserved for concepts with insufficient information to code with codable children: Secondary | ICD-10-CM

## 2014-12-02 DIAGNOSIS — E1165 Type 2 diabetes mellitus with hyperglycemia: Secondary | ICD-10-CM

## 2014-12-02 DIAGNOSIS — E2740 Unspecified adrenocortical insufficiency: Secondary | ICD-10-CM | POA: Diagnosis not present

## 2014-12-02 DIAGNOSIS — E118 Type 2 diabetes mellitus with unspecified complications: Principal | ICD-10-CM

## 2014-12-02 DIAGNOSIS — E782 Mixed hyperlipidemia: Secondary | ICD-10-CM | POA: Diagnosis not present

## 2014-12-02 DIAGNOSIS — I10 Essential (primary) hypertension: Secondary | ICD-10-CM | POA: Diagnosis not present

## 2014-12-02 DIAGNOSIS — E039 Hypothyroidism, unspecified: Secondary | ICD-10-CM | POA: Diagnosis not present

## 2014-12-02 NOTE — Patient Instructions (Signed)
Goals:  Follow Diabetes Meal Plan/The Plate Method as instructed  Cut out baked potatoes, french toast and dumplins  Eat 2 servings of low carb vegetables with lunch and dinner: green beans, cabbage, beets, greens, salads and tomatoes/cucumbers.  Avoid fried and fast foods.  Lose 5 lbs by next visit.  Get A1C down to 7.0 %

## 2014-12-02 NOTE — Progress Notes (Signed)
  Medical Nutrition Therapy:  Appt start time: 1430 end time:  5364 Assessment:  Primary concerns today: DIabetes follow up. Gained 13 lbs since last viist. BS log brought in. Just got back from [place] visiting family. BSl logn 127 157 fasting. Before lunch 120-160's  Before dinner 114-150 and bedtime 116-160 mg/dl. No reent A1C but last A1c in June was 7.9%, up from 7%.  Lives with her husband.Her husband does the shopping and cooking. Limted mobility due to walking issues. Working on less fried foods. She tends to be excessive in portions. Carious teeth.  Still not taking insulin.   Metformin 500  mg BID. Tradgenta 5 mg daily. Can't stand long on feet to cook.   Food journal brought in and shows that she is eating a lot of baked potatoes and needs more lower carb vegetables and fresh fruit with meals. Drinks a lot of water. Need to cut down on potatoes and increase low carb veggies and increase physical activity for  Needed weight loss and improved BS.    Preferred Learning Style:   Auditory  Hands on   Learning Readiness:   Not ready  Contemplating  Ready  Change in progress   MEDICATIONS: see list   DIETARY INTAKE:  24-hr recall:  B ( AM): 2 slices french toast and sf syrup, water Snk ( AM): none  L ( PM): PIzza sub 6 ",  Snk ( PM): D ( PM):chicken and dumplings, sweet potato, potato salad, cheese sticks, water Snack ( PM): none Beverages: water  Usual physical activity: limited due to walking  Estimated energy needs: 1500 calories 170 g carbohydrates 12 g protein 42 g fat  Progress Towards Goal(s):  In progress.   Nutritional Diagnosis:  NB-1.1 Food and nutrition-related knowledge deficit As related to Diabetes.  As evidenced by A1C 7%..    Intervention:  Nutrition counseling and diabetes education on low fat low sodium high fiber diet, meal planning and CHO counting.  Goals:  Follow Diabetes Meal Plan/The Plate Method as instructed  Cut out baked potatoes,  french toast and dumplins  Eat 2 servings of low carb vegetables with lunch and dinner: green beans, cabbage, beets, greens, salads and tomatoes/cucumbers.  Avoid fried and fast foods.  Lose 5 lbs by next visit.  Get A1C down to 7.0 %  Teaching Method Utilized:  Visual Auditory Hands on  Handouts given during visit include: The Plate Method Carb Counting and Food Label handouts Meal Plan Card  Barriers to learning/adherence to lifestyle change: none  Demonstrated degree of understanding via:  Teach Back   Monitoring/Evaluation:  Dietary intake, exercise, meal planning, SBG, and body weight in 1 month.

## 2015-01-03 DIAGNOSIS — E119 Type 2 diabetes mellitus without complications: Secondary | ICD-10-CM | POA: Diagnosis not present

## 2015-01-03 DIAGNOSIS — J209 Acute bronchitis, unspecified: Secondary | ICD-10-CM | POA: Diagnosis not present

## 2015-01-03 DIAGNOSIS — J449 Chronic obstructive pulmonary disease, unspecified: Secondary | ICD-10-CM | POA: Diagnosis not present

## 2015-01-10 DIAGNOSIS — J449 Chronic obstructive pulmonary disease, unspecified: Secondary | ICD-10-CM | POA: Diagnosis not present

## 2015-01-10 DIAGNOSIS — I10 Essential (primary) hypertension: Secondary | ICD-10-CM | POA: Diagnosis not present

## 2015-01-10 DIAGNOSIS — I2510 Atherosclerotic heart disease of native coronary artery without angina pectoris: Secondary | ICD-10-CM | POA: Diagnosis not present

## 2015-01-10 DIAGNOSIS — R32 Unspecified urinary incontinence: Secondary | ICD-10-CM | POA: Diagnosis not present

## 2015-01-18 ENCOUNTER — Ambulatory Visit: Payer: Medicare Other | Admitting: Nutrition

## 2015-01-20 DIAGNOSIS — L603 Nail dystrophy: Secondary | ICD-10-CM | POA: Diagnosis not present

## 2015-01-20 DIAGNOSIS — E1142 Type 2 diabetes mellitus with diabetic polyneuropathy: Secondary | ICD-10-CM | POA: Diagnosis not present

## 2015-01-20 DIAGNOSIS — L851 Acquired keratosis [keratoderma] palmaris et plantaris: Secondary | ICD-10-CM | POA: Diagnosis not present

## 2015-02-09 DIAGNOSIS — Z23 Encounter for immunization: Secondary | ICD-10-CM | POA: Diagnosis not present

## 2015-02-09 DIAGNOSIS — E1140 Type 2 diabetes mellitus with diabetic neuropathy, unspecified: Secondary | ICD-10-CM | POA: Diagnosis not present

## 2015-02-09 DIAGNOSIS — I2510 Atherosclerotic heart disease of native coronary artery without angina pectoris: Secondary | ICD-10-CM | POA: Diagnosis not present

## 2015-02-09 DIAGNOSIS — I10 Essential (primary) hypertension: Secondary | ICD-10-CM | POA: Diagnosis not present

## 2015-02-09 DIAGNOSIS — I110 Hypertensive heart disease with heart failure: Secondary | ICD-10-CM | POA: Diagnosis not present

## 2015-02-16 ENCOUNTER — Encounter: Payer: Self-pay | Admitting: "Endocrinology

## 2015-02-20 DIAGNOSIS — E038 Other specified hypothyroidism: Secondary | ICD-10-CM | POA: Diagnosis not present

## 2015-02-20 DIAGNOSIS — I10 Essential (primary) hypertension: Secondary | ICD-10-CM | POA: Diagnosis not present

## 2015-02-20 DIAGNOSIS — E785 Hyperlipidemia, unspecified: Secondary | ICD-10-CM | POA: Diagnosis not present

## 2015-02-20 DIAGNOSIS — E2740 Unspecified adrenocortical insufficiency: Secondary | ICD-10-CM | POA: Diagnosis not present

## 2015-02-20 DIAGNOSIS — E1165 Type 2 diabetes mellitus with hyperglycemia: Secondary | ICD-10-CM | POA: Diagnosis not present

## 2015-02-27 ENCOUNTER — Encounter: Payer: Self-pay | Admitting: "Endocrinology

## 2015-02-27 ENCOUNTER — Ambulatory Visit (INDEPENDENT_AMBULATORY_CARE_PROVIDER_SITE_OTHER): Payer: Medicare Other | Admitting: "Endocrinology

## 2015-02-27 VITALS — BP 121/72 | HR 69 | Ht 65.0 in | Wt 262.0 lb

## 2015-02-27 DIAGNOSIS — E2740 Unspecified adrenocortical insufficiency: Secondary | ICD-10-CM | POA: Diagnosis not present

## 2015-02-27 DIAGNOSIS — IMO0001 Reserved for inherently not codable concepts without codable children: Secondary | ICD-10-CM

## 2015-02-27 DIAGNOSIS — I10 Essential (primary) hypertension: Secondary | ICD-10-CM

## 2015-02-27 DIAGNOSIS — E1165 Type 2 diabetes mellitus with hyperglycemia: Secondary | ICD-10-CM | POA: Diagnosis not present

## 2015-02-27 DIAGNOSIS — E039 Hypothyroidism, unspecified: Secondary | ICD-10-CM | POA: Diagnosis not present
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
## 2015-02-28 ENCOUNTER — Other Ambulatory Visit (HOSPITAL_COMMUNITY): Payer: Self-pay | Admitting: Pulmonary Disease

## 2015-02-28 ENCOUNTER — Other Ambulatory Visit: Payer: Self-pay | Admitting: Cardiology

## 2015-02-28 ENCOUNTER — Ambulatory Visit (HOSPITAL_COMMUNITY)
Admission: RE | Admit: 2015-02-28 | Discharge: 2015-02-28 | Disposition: A | Discharge status: Home / Self Care | Payer: Medicare Other | Source: Ambulatory Visit | Attending: Pulmonary Disease | Admitting: Pulmonary Disease

## 2015-02-28 DIAGNOSIS — M5136 Other intervertebral disc degeneration, lumbar region: Secondary | ICD-10-CM | POA: Diagnosis not present

## 2015-02-28 DIAGNOSIS — J449 Chronic obstructive pulmonary disease, unspecified: Secondary | ICD-10-CM | POA: Diagnosis not present

## 2015-02-28 DIAGNOSIS — E279 Disorder of adrenal gland, unspecified: Secondary | ICD-10-CM | POA: Diagnosis not present

## 2015-02-28 DIAGNOSIS — M47896 Other spondylosis, lumbar region: Secondary | ICD-10-CM | POA: Diagnosis not present

## 2015-02-28 DIAGNOSIS — M545 Low back pain: Secondary | ICD-10-CM | POA: Diagnosis not present

## 2015-02-28 DIAGNOSIS — E1140 Type 2 diabetes mellitus with diabetic neuropathy, unspecified: Secondary | ICD-10-CM | POA: Diagnosis not present

## 2015-03-07 ENCOUNTER — Other Ambulatory Visit: Payer: Self-pay

## 2015-03-07 NOTE — Telephone Encounter (Signed)
i wrote the rx.

## 2015-03-07 NOTE — Telephone Encounter (Signed)
Pts husband is questioning if Ann [name] should be taking or continue Tradjenta? If so pt needs a printed signed 90 day script to pick up.

## 2015-04-07 DIAGNOSIS — L603 Nail dystrophy: Secondary | ICD-10-CM | POA: Diagnosis not present

## 2015-04-07 DIAGNOSIS — E1142 Type 2 diabetes mellitus with diabetic polyneuropathy: Secondary | ICD-10-CM | POA: Diagnosis not present

## 2015-04-07 DIAGNOSIS — L851 Acquired keratosis [keratoderma] palmaris et plantaris: Secondary | ICD-10-CM | POA: Diagnosis not present

## 2015-04-11 DIAGNOSIS — I2510 Atherosclerotic heart disease of native coronary artery without angina pectoris: Secondary | ICD-10-CM | POA: Diagnosis not present

## 2015-04-11 DIAGNOSIS — E279 Disorder of adrenal gland, unspecified: Secondary | ICD-10-CM | POA: Diagnosis not present

## 2015-04-11 DIAGNOSIS — I10 Essential (primary) hypertension: Secondary | ICD-10-CM | POA: Diagnosis not present

## 2015-04-11 DIAGNOSIS — J449 Chronic obstructive pulmonary disease, unspecified: Secondary | ICD-10-CM | POA: Diagnosis not present

## 2015-05-30 ENCOUNTER — Other Ambulatory Visit: Payer: Self-pay | Admitting: "Endocrinology

## 2015-05-30 DIAGNOSIS — E1165 Type 2 diabetes mellitus with hyperglycemia: Secondary | ICD-10-CM | POA: Diagnosis not present

## 2015-05-30 DIAGNOSIS — E039 Hypothyroidism, unspecified: Secondary | ICD-10-CM | POA: Diagnosis not present

## 2015-05-30 LAB — TSH: TSH: 0.489 u[IU]/mL (ref 0.350–4.500)

## 2015-05-30 LAB — BASIC METABOLIC PANEL
BUN: 15 mg/dL (ref 7–25)
CO2: 21 mmol/L (ref 20–31)
Calcium: 8.9 mg/dL (ref 8.6–10.4)
Chloride: 103 mmol/L (ref 98–110)
Creat: 1.05 mg/dL — ABNORMAL HIGH (ref 0.50–0.99)
GLUCOSE: 174 mg/dL — AB (ref 65–99)
Potassium: 4.9 mmol/L (ref 3.5–5.3)
Sodium: 142 mmol/L (ref 135–146)

## 2015-05-30 LAB — T4, FREE: Free T4: 1.36 ng/dL (ref 0.80–1.80)

## 2015-05-31 LAB — HEMOGLOBIN A1C
Hgb A1c MFr Bld: 12.3 % — ABNORMAL HIGH (ref <5.7)
MEAN PLASMA GLUCOSE: 306 mg/dL — AB (ref <117)

## 2015-06-08 ENCOUNTER — Encounter: Payer: Self-pay | Admitting: "Endocrinology

## 2015-06-08 ENCOUNTER — Ambulatory Visit (INDEPENDENT_AMBULATORY_CARE_PROVIDER_SITE_OTHER): Payer: Medicare Other | Admitting: "Endocrinology

## 2015-06-08 VITALS — BP 109/52 | HR 74 | Ht 65.0 in | Wt 269.0 lb

## 2015-06-08 DIAGNOSIS — I10 Essential (primary) hypertension: Secondary | ICD-10-CM

## 2015-06-08 DIAGNOSIS — E2740 Unspecified adrenocortical insufficiency: Secondary | ICD-10-CM | POA: Insufficient documentation

## 2015-06-08 DIAGNOSIS — E1165 Type 2 diabetes mellitus with hyperglycemia: Secondary | ICD-10-CM

## 2015-06-08 DIAGNOSIS — E039 Hypothyroidism, unspecified: Secondary | ICD-10-CM | POA: Diagnosis not present

## 2015-06-08 DIAGNOSIS — Z794 Long term (current) use of insulin: Secondary | ICD-10-CM | POA: Diagnosis not present

## 2015-06-08 DIAGNOSIS — E1159 Type 2 diabetes mellitus with other circulatory complications: Secondary | ICD-10-CM | POA: Diagnosis not present

## 2015-06-08 DIAGNOSIS — IMO0002 Reserved for concepts with insufficient information to code with codable children: Secondary | ICD-10-CM

## 2015-06-08 MED ORDER — LANCETS MISC: 1.0000 | Status: DC

## 2015-06-08 MED ORDER — INSULIN DETEMIR 100 UNIT/ML FLEXPEN: 20.0000 [IU] | PEN_INJECTOR | Freq: Every day | SUBCUTANEOUS | Status: DC

## 2015-06-08 MED ORDER — INSULIN PEN NEEDLE 31G X 8 MM MISC: 1.0000 | Status: DC
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
## 2015-06-08 NOTE — Patient Instructions (Signed)

## 2015-06-20 ENCOUNTER — Other Ambulatory Visit (HOSPITAL_COMMUNITY): Payer: Self-pay | Admitting: Pulmonary Disease

## 2015-06-20 DIAGNOSIS — E1140 Type 2 diabetes mellitus with diabetic neuropathy, unspecified: Secondary | ICD-10-CM | POA: Diagnosis not present

## 2015-06-20 DIAGNOSIS — R1010 Upper abdominal pain, unspecified: Secondary | ICD-10-CM

## 2015-06-20 DIAGNOSIS — J449 Chronic obstructive pulmonary disease, unspecified: Secondary | ICD-10-CM | POA: Diagnosis not present

## 2015-06-20 DIAGNOSIS — I10 Essential (primary) hypertension: Secondary | ICD-10-CM | POA: Diagnosis not present

## 2015-06-21 ENCOUNTER — Ambulatory Visit (INDEPENDENT_AMBULATORY_CARE_PROVIDER_SITE_OTHER): Payer: Medicare Other | Admitting: "Endocrinology

## 2015-06-21 ENCOUNTER — Encounter: Payer: Self-pay | Admitting: "Endocrinology

## 2015-06-21 VITALS — Ht 65.0 in

## 2015-06-21 DIAGNOSIS — E039 Hypothyroidism, unspecified: Secondary | ICD-10-CM

## 2015-06-21 DIAGNOSIS — E1159 Type 2 diabetes mellitus with other circulatory complications: Secondary | ICD-10-CM

## 2015-06-21 DIAGNOSIS — E1165 Type 2 diabetes mellitus with hyperglycemia: Secondary | ICD-10-CM

## 2015-06-21 DIAGNOSIS — I10 Essential (primary) hypertension: Secondary | ICD-10-CM | POA: Diagnosis not present

## 2015-06-21 DIAGNOSIS — E2740 Unspecified adrenocortical insufficiency: Secondary | ICD-10-CM

## 2015-06-21 DIAGNOSIS — IMO0002 Reserved for concepts with insufficient information to code with codable children: Secondary | ICD-10-CM

## 2015-06-21 DIAGNOSIS — E785 Hyperlipidemia, unspecified: Secondary | ICD-10-CM | POA: Diagnosis not present

## 2015-06-21 DIAGNOSIS — Z794 Long term (current) use of insulin: Secondary | ICD-10-CM | POA: Diagnosis not present

## 2015-06-21 NOTE — Patient Instructions (Signed)
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
## 2015-06-22 ENCOUNTER — Ambulatory Visit (HOSPITAL_COMMUNITY)
Admission: RE | Admit: 2015-06-22 | Discharge: 2015-06-22 | Disposition: A | Discharge status: Home / Self Care | Payer: Medicare Other | Source: Ambulatory Visit | Attending: Pulmonary Disease | Admitting: Pulmonary Disease

## 2015-06-22 DIAGNOSIS — R1010 Upper abdominal pain, unspecified: Secondary | ICD-10-CM | POA: Diagnosis not present

## 2015-06-22 DIAGNOSIS — K760 Fatty (change of) liver, not elsewhere classified: Secondary | ICD-10-CM | POA: Diagnosis not present

## 2015-06-22 MED ORDER — IOHEXOL 300 MG/ML  SOLN
100.0000 mL | Freq: Once | INTRAMUSCULAR | Status: AC | PRN
  Administered 2015-06-22: 100 mL via INTRAVENOUS

## 2015-06-29 ENCOUNTER — Encounter: Payer: Self-pay | Admitting: Gastroenterology

## 2015-07-14 ENCOUNTER — Ambulatory Visit (INDEPENDENT_AMBULATORY_CARE_PROVIDER_SITE_OTHER): Payer: Medicare Other | Admitting: Gastroenterology

## 2015-07-14 ENCOUNTER — Encounter: Payer: Self-pay | Admitting: Gastroenterology

## 2015-07-14 ENCOUNTER — Other Ambulatory Visit: Payer: Self-pay

## 2015-07-14 VITALS — BP 121/78 | HR 55 | Temp 97.1°F | Ht 64.0 in | Wt 256.0 lb

## 2015-07-14 DIAGNOSIS — I2510 Atherosclerotic heart disease of native coronary artery without angina pectoris: Secondary | ICD-10-CM

## 2015-07-14 DIAGNOSIS — R1013 Epigastric pain: Secondary | ICD-10-CM

## 2015-07-14 DIAGNOSIS — R935 Abnormal findings on diagnostic imaging of other abdominal regions, including retroperitoneum: Secondary | ICD-10-CM | POA: Diagnosis not present

## 2015-07-14 DIAGNOSIS — R634 Abnormal weight loss: Secondary | ICD-10-CM

## 2015-07-14 DIAGNOSIS — K219 Gastro-esophageal reflux disease without esophagitis: Secondary | ICD-10-CM

## 2015-07-14 NOTE — Patient Instructions (Signed)
Upper endoscopy as scheduled. See separate instructions.  I will review your CT scan with the radiologist. Further recommendations to follow.
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
## 2015-07-18 ENCOUNTER — Ambulatory Visit (INDEPENDENT_AMBULATORY_CARE_PROVIDER_SITE_OTHER): Payer: Medicare Other | Admitting: Adult Health

## 2015-07-18 ENCOUNTER — Encounter: Payer: Self-pay | Admitting: Adult Health

## 2015-07-18 VITALS — BP 106/64 | HR 60 | Ht 64.0 in | Wt 257.0 lb

## 2015-07-18 DIAGNOSIS — I2510 Atherosclerotic heart disease of native coronary artery without angina pectoris: Secondary | ICD-10-CM | POA: Diagnosis not present

## 2015-07-18 DIAGNOSIS — I4891 Unspecified atrial fibrillation: Secondary | ICD-10-CM | POA: Diagnosis not present

## 2015-07-18 NOTE — Progress Notes (Signed)
Name: Ann [name]    DOB: 1945-10-26  Age: 70 y.o.  MR#: [id number]       PCP:  Alonza Bogus, MD      Insurance: Payor: MEDICARE / Plan: MEDICARE PART A AND B / Product Type: *No Product type* /   CC:   No chief complaint on file.   VS Filed Vitals:   07/18/15 1501  BP: 106/64  Pulse: 60  Height: 5\' 4"  (1.626 m)  Weight: 257 lb (116.574 kg)  SpO2: 96%    Weights Current Weight  07/18/15 257 lb (116.574 kg)  07/14/15 256 lb (116.121 kg)  06/08/15 269 lb (122.018 kg)    Blood Pressure  BP Readings from Last 3 Encounters:  07/18/15 106/64  07/14/15 121/78  06/08/15 109/52     Admit date:  (Not on file) Last encounter with RMR:  Visit date not found   Allergy Ace inhibitors; Tape; Niacin; and Reglan  Current Outpatient Prescriptions  Medication Sig Dispense Refill  . albuterol (PROVENTIL HFA;VENTOLIN HFA) 108 (90 BASE) MCG/ACT inhaler Inhale 2 puffs into the lungs every 4 (four) hours as needed for wheezing.    Marland Kitchen amLODipine (NORVASC) 5 MG tablet Take 1 tablet (5 mg total) by mouth daily. 30 tablet 3  . aspirin EC 81 MG tablet Take 81 mg by mouth daily.    Marland Kitchen aspirin-acetaminophen-caffeine (EXCEDRIN MIGRAINE) 250-250-65 MG per tablet Take 1 tablet by mouth daily as needed for headache. Reported on 06/21/2015    . atorvastatin (LIPITOR) 80 MG tablet Take 1 tablet (80 mg total) by mouth daily at 6 PM. 30 tablet 12  . carvedilol (COREG) 6.25 MG tablet Take 6.25 mg by mouth 2 (two) times daily with a meal.    . diltiazem (CARDIZEM) 120 MG tablet Take 120 mg by mouth daily.    . furosemide (LASIX) 40 MG tablet Take 40 mg by mouth daily.    Marland Kitchen gabapentin (NEURONTIN) 100 MG capsule Take 1 capsule (100 mg total) by mouth 3 (three) times daily.    . hydrALAZINE (APRESOLINE) 50 MG tablet Take 1 tablet (50 mg total) by mouth 3 (three) times daily. 120 tablet 12  . Insulin Detemir (LEVEMIR FLEXTOUCH) 100 UNIT/ML Pen Inject 20 Units into the skin daily at 10 pm. 15 mL 2  . Insulin  Pen Needle (B-D ULTRAFINE III SHORT PEN) 31G X 8 MM MISC 1 each by Does not apply route as directed. 50 each 3  . Lancets MISC 1 each by Does not apply route as directed. 100 each 3  . levothyroxine (SYNTHROID, LEVOTHROID) 200 MCG tablet Take 150 mcg by mouth daily before breakfast. 150 mcg one tablet by mouth daily.    Marland Kitchen loratadine (CLARITIN) 10 MG tablet Take 10 mg by mouth daily.    . metFORMIN (GLUMETZA) 500 MG (MOD) 24 hr tablet 500 mg 2 (two) times daily with a meal.    . nitroGLYCERIN (NITROSTAT) 0.4 MG SL tablet Place 1 tablet (0.4 mg total) under the tongue every 5 (five) minutes as needed for chest pain.  12  . omeprazole (PRILOSEC) 20 MG capsule Take 20 mg by mouth daily.    . potassium chloride (KLOR-CON M15) 15 MEQ tablet Take 2 tablets (30 mEq total) by mouth 2 (two) times daily. 60 tablet 12  . predniSONE (DELTASONE) 10 MG tablet Take 10 mg by mouth daily with breakfast.    . sertraline (ZOLOFT) 100 MG tablet Take 0.5 tablets (50 mg total) by mouth at bedtime.  30 tablet 11  . traMADol (ULTRAM) 50 MG tablet Take by mouth 4 (four) times daily.    . vitamin B-12 1000 MCG tablet Take 1 tablet (1,000 mcg total) by mouth daily.     No current facility-administered medications for this visit.    Discontinued Meds:    Medications Discontinued During This Encounter  Medication Reason  . baclofen (LIORESAL) 10 MG tablet Error  . hydroxypropyl methylcellulose (ISOPTO TEARS) 2.5 % ophthalmic solution Error    Patient Active Problem List   Diagnosis Date Noted  . GERD (gastroesophageal reflux disease) 07/14/2015  . Abdominal pain, epigastric 07/14/2015  . Abnormal CT of the abdomen 07/14/2015  . Loss of weight 07/14/2015  . Poor balance 01/20/2014  . Tremor 01/19/2014  . Abnormality of gait 11/11/2013  . Sepsis ([place]) 03/15/2013  . Elevated troponin 03/15/2013  . VRE (vancomycin resistant enterococcus) culture positive 10/10/2012  . Chest pain 10/10/2012  . Nonsustained  ventricular tachycardia ([place]) 10/10/2012  . Cellulitis of arm, right 10/06/2012  . Acute renal failure (ARF) ([place]) 10/06/2012  . Intractable nausea and vomiting 09/15/2012  . Hydronephrosis 09/15/2012  . Heme positive stool 09/10/2012  . Nausea with vomiting 09/10/2012  . Abdominal mass, right lower quadrant 09/10/2012  . Abnormal LFTs 09/09/2012  . Rectal bleeding 08/22/2012  . Anemia 08/22/2012  . Chest pain, unspecified 08/09/2012  . Diverticulitis of colon (without mention of hemorrhage) 08/09/2012  . Acute on chronic diastolic heart failure ([place]) 08/06/2012  . Septic shock ([place]) 07/30/2012  . UTI (urinary tract infection) 07/30/2012  . FTT (failure to thrive) in adult 07/21/2012  . Protein-calorie malnutrition, severe ([place]) 07/21/2012  . Atrial fibrillation ([place]) 07/02/2012  . Lethargy 06/29/2012  . Vitamin B12 deficiency 06/28/2012  . Sinusitis chronic, frontal 06/28/2012  . Bilateral lower extremity edema 06/28/2012  . Left leg cellulitis 06/28/2012  . Morbid obesity ([place]) 06/28/2012  . Chronic kidney disease, stage III (moderate) 06/28/2012  . ARF (acute renal failure) ([place]) 06/27/2012  . UTI (lower urinary tract infection) 06/27/2012  . Hypokalemia 06/27/2012  . Shortness of breath 06/27/2012  . Lower extremity weakness 06/14/2012  . Multiple skin tears 06/14/2012  . Adrenal insufficiency ([place]) 01/07/2012  . Type II diabetes mellitus, uncontrolled ([place]) 04/19/2011  . Chronic diastolic heart failure ([place]) 04/19/2011  . Cellulitis of leg without foot, left 02/11/2011  . GANGLION OF TENDON SHEATH 02/21/2010  . Edema 01/19/2009  . Primary hypothyroidism 12/01/2008  . Hyperlipidemia 12/01/2008  . Essential hypertension 12/01/2008  . CAD 12/01/2008  . GASTROESOPHAGEAL REFLUX DISEASE 12/01/2008  . RENAL FAILURE, CHRONIC 12/01/2008    LABS    Component Value Date/Time   NA 142 05/30/2015 1143   NA 140 09/24/2014 1403   NA 140 03/22/2013 0516   K 4.9 05/30/2015  1143   K 4.6 09/24/2014 1403   K 3.9 03/22/2013 0516   K 3.3 09/07/2012   CL 103 05/30/2015 1143   CL 107 09/24/2014 1403   CL 103 03/22/2013 0516   CO2 21 05/30/2015 1143   CO2 26 09/24/2014 1403   CO2 31 03/22/2013 0516   GLUCOSE 174* 05/30/2015 1143   GLUCOSE 106* 09/24/2014 1403   GLUCOSE 65* 03/22/2013 0516   BUN 15 05/30/2015 1143   BUN 20 09/24/2014 1403   BUN 11 03/22/2013 0516   CREATININE 1.05* 05/30/2015 1143   CREATININE 0.84 09/24/2014 1403   CREATININE 0.82 03/22/2013 0516   CREATININE 0.82 03/21/2013 0614   CALCIUM 8.9 05/30/2015 1143   CALCIUM  9.1 09/24/2014 1403   CALCIUM 9.8 03/22/2013 0516   CALCIUM 8.2 09/07/2012   CALCIUM 8.2 08/31/2012   CALCIUM 7.7* 06/28/2012 0806   GFRNONAA 69* 09/24/2014 1403   GFRNONAA 72* 03/22/2013 0516   GFRNONAA 72* 03/21/2013 0614   GFRAA 80* 09/24/2014 1403   GFRAA 84* 03/22/2013 0516   GFRAA 84* 03/21/2013 0614   CMP     Component Value Date/Time   NA 142 05/30/2015 1143   K 4.9 05/30/2015 1143   K 3.3 09/07/2012   CL 103 05/30/2015 1143   CO2 21 05/30/2015 1143   GLUCOSE 174* 05/30/2015 1143   BUN 15 05/30/2015 1143   CREATININE 1.05* 05/30/2015 1143   CREATININE 0.84 09/24/2014 1403   CALCIUM 8.9 05/30/2015 1143   CALCIUM 8.2 09/07/2012   CALCIUM 7.7* 06/28/2012 0806   PROT 6.5 03/16/2013 0519   PROT 5.9 09/07/2012   ALBUMIN 3.1* 03/16/2013 0519   ALBUMIN 2.0 09/07/2012   AST 70* 03/16/2013 0519   AST 116 09/07/2012   ALT 62* 03/16/2013 0519   ALKPHOS 106 03/16/2013 0519   ALKPHOS 653 09/07/2012   BILITOT 0.9 03/16/2013 0519   BILITOT 1.0 09/07/2012   GFRNONAA 69* 09/24/2014 1403   GFRAA 80* 09/24/2014 1403       Component Value Date/Time   WBC 9.8 09/24/2014 1403   WBC 9.7 03/21/2013 0614   WBC 8.7 03/18/2013 0435   HGB 12.6 09/24/2014 1403   HGB 12.1 03/21/2013 0614   HGB 11.6* 03/18/2013 0435   HCT 40.0 09/24/2014 1403   HCT 37.2 03/21/2013 0614   HCT 36.2 03/18/2013 0435   HCT 27  09/07/2012   HCT 26 08/31/2012   MCV 92.0 09/24/2014 1403   MCV 89.9 03/21/2013 0614   MCV 89.4 03/18/2013 0435   MCV 103.4 09/07/2012    Lipid Panel     Component Value Date/Time   CHOL  04/14/2008 0639    162        ATP III CLASSIFICATION:  <200     mg/dL   Desirable  200-239  mg/dL   Borderline High  >=240    mg/dL   High   TRIG 107 04/14/2008 0639   HDL 22* 04/14/2008 0639   CHOLHDL 7.4 04/14/2008 0639   VLDL 21 04/14/2008 0639   LDLCALC * 04/14/2008 0639    119        Total Cholesterol/HDL:CHD Risk Coronary Heart Disease Risk Table                     Men   Women  1/2 Average Risk   3.4   3.3    ABG    Component Value Date/Time   PHART 7.399 03/15/2013 1520   PCO2ART 47.8* 03/15/2013 1520   PO2ART 78.2* 03/15/2013 1520   HCO3 29.0* 03/15/2013 1520   TCO2 25.8 03/15/2013 1520   ACIDBASEDEF 4.7* 06/29/2012 0955   O2SAT 95.1 03/15/2013 1520     Lab Results  Component Value Date   TSH 0.489 05/30/2015   BNP (last 3 results) No results for input(s): BNP in the last 8760 hours.  ProBNP (last 3 results) No results for input(s): PROBNP in the last 8760 hours.  Cardiac Panel (last 3 results) No results for input(s): CKTOTAL, CKMB, TROPONINI, RELINDX in the last 72 hours.  Iron/TIBC/Ferritin/ %Sat    Component Value Date/Time   IRON 20* 06/13/2012 1426   TIBC 351 06/13/2012 1426   FERRITIN 638* 09/16/2012 0358   IRONPCTSAT  6* 06/13/2012 1426     EKG Orders placed or performed in visit on 07/18/15  . EKG 12-Lead     Prior Assessment and Plan Problem List as of 07/18/2015      Cardiovascular and Mediastinum   Essential hypertension   Last Assessment & Plan 01/07/2012 Office Visit Written 01/07/2012  2:00 PM by Renella Cunas, MD    If anything, she's probably too low right now but asymptomatic. With her complex medical program. I've made no changes.      CAD   Last Assessment & Plan 01/07/2012 Office Visit Written 01/07/2012  1:58 PM by Renella Cunas, MD     Stable. Continue secondary preventative therapy.      Chronic diastolic heart failure [facility])   Last Assessment & Plan 07/21/2012 Office Visit Written 07/21/2012  5:00 PM by Renella Cunas, MD    Relatively stable.      Atrial fibrillation [facility])   Last Assessment & Plan 07/21/2012 Office Visit Written 07/21/2012  4:59 PM by Renella Cunas, MD    Continue with anticoagulation. With her blood pressure being so low I am going to discontinue her verapamil. Her exam indicates that she's probably in sinus rhythm today. I worry about falls not to mention for general perfusion.      Acute on chronic diastolic heart failure (HCC)   Nonsustained ventricular tachycardia (HCC)     Respiratory   Sinusitis chronic, frontal     Digestive   GASTROESOPHAGEAL REFLUX DISEASE   Last Assessment & Plan 04/19/2011 Office Visit Written 04/19/2011  3:10 PM by Renella Cunas, MD    Continue Nexium. Nitroglycerin for backup      Vitamin B12 deficiency   Diverticulitis of colon (without mention of hemorrhage)   Rectal bleeding   Nausea with vomiting   Last Assessment & Plan 09/09/2012 Office Visit Edited 09/10/2012  3:58 PM by Mahala Menghini, PA-C    70 year old female with multiple medical problems as noted in past medical history who presents with acute onset nausea with vomiting, right upper quadrant pain, abnormal LFTs. Alkaline phosphatase in the 800s, AST in the 100s, ALT elevated as well. Bilirubin has been normal. Interestingly back in March she had normal LFTs. CT at that time without contrast showed sigmoid diverticulitis, uncomplicated. She had an EGD in 2013 by Dr. Lucio Edward that showed Candida esophagitis and gastritis without H. Pylori. Gallbladder removed remotely. Recent abdominal ultrasound with no evidence of biliary dilation or liver abnormality except for hepatic steatosis. I have discussed this case with Dr. Barney Drain and the CT tech. Will proceed with a CT of the abdomen and pelvis with  reduced dose contrast to further evaluate liver and to evaluate right-sided abdominal wall mass. Patient and nursing home has been instructed to have the patient drink plenty of fluids today and tomorrow. Need to look at biliary tree and rule out hepatic lesions. If CT is unremarkable, we'll check AMA as next step. Of note, patient is on chronic low-dose prednisone.  She also has stable chronic anemia with Hemoccult-positive stool. Previously felt not to be candidate for endoscopy due to her debilitation and respiratory status. Air-contrast barium enema in January of this year showed rectal polyp and diverticulosis. She is no longer Xarelto.   Right-sided abdominal wall mass likely abscess from insulin injections. Evaluate with CT due to size.  Further recommendations to follow.  Greater than 30 minutes spent reviewing patient's medical record.      GERD (  gastroesophageal reflux disease)     Endocrine   Primary hypothyroidism   Type II diabetes mellitus, uncontrolled (HCC)   Adrenal insufficiency (HCC)     Nervous and Auditory   Lower extremity weakness     Musculoskeletal and Integument   GANGLION OF TENDON SHEATH   Multiple skin tears     Genitourinary   RENAL FAILURE, CHRONIC   ARF (acute renal failure) (HCC)   UTI (lower urinary tract infection)   Chronic kidney disease, stage III (moderate)   UTI (urinary tract infection)   Hydronephrosis   Acute renal failure (ARF) ([place])     Other   Hyperlipidemia   Edema   Last Assessment & Plan 04/19/2011 Office Visit Written 04/19/2011  3:10 PM by Renella Cunas, MD    Improved. See above      Cellulitis of leg without foot, left   Hypokalemia   Shortness of breath   Bilateral lower extremity edema   Left leg cellulitis   Morbid obesity (HCC)   Lethargy   FTT (failure to thrive) in adult   Last Assessment & Plan 07/21/2012 Office Visit Written 07/21/2012  5:02 PM by Renella Cunas, MD    I do not think that she is long for this  world. Did not have time to do end of  life counseling. I would not consider her a ventilator candidate. I do not think she would be someone that could be weaned.      Protein-calorie malnutrition, severe [facility])   Last Assessment & Plan 07/21/2012 Office Visit Written 07/21/2012  5:03 PM by Renella Cunas, MD    Continue boost protein supplements      Septic shock ([place])   Chest pain, unspecified   Anemia   Abnormal LFTs   Heme positive stool   Abdominal mass, right lower quadrant   Intractable nausea and vomiting   Cellulitis of arm, right   VRE (vancomycin resistant enterococcus) culture positive   Chest pain   Sepsis (HCC)   Elevated troponin   Abnormality of gait   Tremor   Poor balance   Abdominal pain, epigastric   Abnormal CT of the abdomen   Loss of weight       Imaging: Ct Abdomen Pelvis W Contrast  06/22/2015  CLINICAL DATA:  Upper abdominal pain and sour belching for the past week. EXAM: CT ABDOMEN AND PELVIS WITH CONTRAST TECHNIQUE: Multidetector CT imaging of the abdomen and pelvis was performed using the standard protocol following bolus administration of intravenous contrast. CONTRAST:  165mL OMNIPAQUE IOHEXOL 300 MG/ML  SOLN COMPARISON:  09/14/2012. FINDINGS: Lower chest: Residual linear densities at both lung bases, left greater than right. Hepatobiliary: Cholecystectomy clips. Mild diffuse low density of the liver relative to the spleen, with significant improvement. Pancreas: No mass, inflammatory changes, or other significant abnormality. Spleen: Within normal limits in size and appearance. Adrenals/Urinary Tract: No masses identified. No evidence of hydronephrosis. Stomach/Bowel: Multiple sigmoid colon diverticula. Surgically absent appendix. No gastric or small bowel abnormalities. Vascular/Lymphatic: Atheromatous arterial calcifications. No enlarged lymph nodes. Reproductive: Surgically absent uterus.  No adnexal mass. Other: Interval small oval area of fat necrosis  in the anterior subcutaneous fat of the lower abdomen on the right. Additional small, rounded nodular densities in the subcutaneous fat of the anterior abdomen bilaterally. These are significantly fewer and smaller in size than previously demonstrated and are compatible with sites of subcutaneous injection. Small umbilical hernia containing fat. Musculoskeletal: Lumbar and lower thoracic spine degenerative changes.  IMPRESSION: 1. No acute abnormality. 2. Mild diffuse hepatic steatosis with significant improvement. Electronically Signed   By: Claudie Revering M.D.   On: 06/22/2015 13:26

## 2015-07-18 NOTE — Patient Instructions (Signed)
Your physician recommends that you schedule a follow-up appointment in: 3 months with Dr Bronson Ing   Your physician recommends that you continue on your current medications as directed. Please refer to the Current Medication list given to you today.    If you need a refill on your cardiac medications before your next appointment, please call your pharmacy.   Your physician has requested that you have an echocardiogram. Echocardiography is a painless test that uses sound waves to create images of your heart. It provides your doctor with information about the size and shape of your heart and how well your heart's chambers and valves are working. This procedure takes approximately one hour. There are no restrictions for this procedure.   Your physician has requested that you have a lexiscan myoview. For further information please visit [email or web address]. Please follow instruction sheet, as given.       Thank you for choosing [facility] !
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
## 2015-07-19 ENCOUNTER — Telehealth: Payer: Self-pay | Admitting: Gastroenterology

## 2015-07-19 NOTE — Assessment & Plan Note (Addendum)
One month history of epigastric pain, nausea, weight loss, heartburn refractory to PPI, questionable melena in the setting of chronic prednisone and aspirin use. Would be concerned about possibility of gastritis or peptic ulcer disease. Diabetes is poorly controlled therefore underlying gastroparesis is not excluded. Patient previously was intolerant to Reglan due to irregular movements of her extremities. Recommend EGD for further evaluation of her symptoms.  I have discussed the risks, alternatives, benefits with regards to but not limited to the risk of reaction to medication, bleeding, infection, perforation and the patient is agreeable to proceed. Written consent to be obtained.

## 2015-07-19 NOTE — Progress Notes (Signed)
cc'ed to pcp °

## 2015-07-19 NOTE — Telephone Encounter (Signed)
Please request latest LFTs from PCP.

## 2015-07-19 NOTE — Assessment & Plan Note (Signed)
CT with findings of small oval area of fat necrosis the lower abdomen on the right, additional small rounded nodular densities in the subcutaneous fat bilaterally. Suspect related to subcutaneous injections. Likely not the source of her epigastric pain. Review CT with radiologist.

## 2015-07-19 NOTE — Telephone Encounter (Signed)
Requested LFTs from PCP

## 2015-07-28 DIAGNOSIS — L603 Nail dystrophy: Secondary | ICD-10-CM | POA: Diagnosis not present

## 2015-07-28 DIAGNOSIS — E1142 Type 2 diabetes mellitus with diabetic polyneuropathy: Secondary | ICD-10-CM | POA: Diagnosis not present

## 2015-07-28 DIAGNOSIS — L851 Acquired keratosis [keratoderma] palmaris et plantaris: Secondary | ICD-10-CM | POA: Diagnosis not present

## 2015-08-01 ENCOUNTER — Telehealth: Payer: Self-pay

## 2015-08-08 ENCOUNTER — Other Ambulatory Visit: Payer: Self-pay | Admitting: "Endocrinology

## 2015-08-08 DIAGNOSIS — E1159 Type 2 diabetes mellitus with other circulatory complications: Secondary | ICD-10-CM | POA: Diagnosis not present

## 2015-08-08 DIAGNOSIS — Z794 Long term (current) use of insulin: Secondary | ICD-10-CM | POA: Diagnosis not present

## 2015-08-08 DIAGNOSIS — E1165 Type 2 diabetes mellitus with hyperglycemia: Secondary | ICD-10-CM | POA: Diagnosis not present

## 2015-08-08 LAB — BASIC METABOLIC PANEL
BUN: 16 mg/dL (ref 7–25)
CO2: 25 mmol/L (ref 20–31)
Calcium: 9.4 mg/dL (ref 8.6–10.4)
Chloride: 104 mmol/L (ref 98–110)
Creat: 0.88 mg/dL (ref 0.50–0.99)
GLUCOSE: 120 mg/dL — AB (ref 65–99)
POTASSIUM: 5.5 mmol/L — AB (ref 3.5–5.3)
Sodium: 140 mmol/L (ref 135–146)

## 2015-08-09 LAB — HEMOGLOBIN A1C
HEMOGLOBIN A1C: 8.5 % — AB (ref <5.7)
Mean Plasma Glucose: 197 mg/dL — ABNORMAL HIGH (ref <117)

## 2015-08-10 ENCOUNTER — Encounter (HOSPITAL_COMMUNITY)
Admission: RE | Disposition: A | Discharge status: Home / Self Care | Payer: Self-pay | Source: Ambulatory Visit | Attending: Gastroenterology

## 2015-08-10 ENCOUNTER — Telehealth: Payer: Self-pay

## 2015-08-10 ENCOUNTER — Ambulatory Visit (HOSPITAL_COMMUNITY)
Admission: RE | Admit: 2015-08-10 | Discharge: 2015-08-10 | Disposition: A | Discharge status: Home / Self Care | Payer: Medicare Other | Source: Ambulatory Visit | Attending: Gastroenterology | Admitting: Gastroenterology

## 2015-08-10 ENCOUNTER — Encounter (HOSPITAL_COMMUNITY): Payer: Self-pay | Admitting: *Deleted

## 2015-08-10 DIAGNOSIS — N189 Chronic kidney disease, unspecified: Secondary | ICD-10-CM | POA: Diagnosis not present

## 2015-08-10 DIAGNOSIS — R1013 Epigastric pain: Secondary | ICD-10-CM | POA: Diagnosis not present

## 2015-08-10 DIAGNOSIS — E039 Hypothyroidism, unspecified: Secondary | ICD-10-CM | POA: Insufficient documentation

## 2015-08-10 DIAGNOSIS — E876 Hypokalemia: Secondary | ICD-10-CM | POA: Diagnosis not present

## 2015-08-10 DIAGNOSIS — Z794 Long term (current) use of insulin: Secondary | ICD-10-CM | POA: Diagnosis not present

## 2015-08-10 DIAGNOSIS — Z87891 Personal history of nicotine dependence: Secondary | ICD-10-CM | POA: Insufficient documentation

## 2015-08-10 DIAGNOSIS — E1122 Type 2 diabetes mellitus with diabetic chronic kidney disease: Secondary | ICD-10-CM | POA: Diagnosis not present

## 2015-08-10 DIAGNOSIS — F419 Anxiety disorder, unspecified: Secondary | ICD-10-CM | POA: Insufficient documentation

## 2015-08-10 DIAGNOSIS — Z7982 Long term (current) use of aspirin: Secondary | ICD-10-CM | POA: Insufficient documentation

## 2015-08-10 DIAGNOSIS — M1991 Primary osteoarthritis, unspecified site: Secondary | ICD-10-CM | POA: Diagnosis not present

## 2015-08-10 DIAGNOSIS — J449 Chronic obstructive pulmonary disease, unspecified: Secondary | ICD-10-CM | POA: Diagnosis not present

## 2015-08-10 DIAGNOSIS — I2510 Atherosclerotic heart disease of native coronary artery without angina pectoris: Secondary | ICD-10-CM | POA: Insufficient documentation

## 2015-08-10 DIAGNOSIS — I130 Hypertensive heart and chronic kidney disease with heart failure and stage 1 through stage 4 chronic kidney disease, or unspecified chronic kidney disease: Secondary | ICD-10-CM | POA: Insufficient documentation

## 2015-08-10 DIAGNOSIS — Z86718 Personal history of other venous thrombosis and embolism: Secondary | ICD-10-CM | POA: Diagnosis not present

## 2015-08-10 DIAGNOSIS — Z79899 Other long term (current) drug therapy: Secondary | ICD-10-CM | POA: Insufficient documentation

## 2015-08-10 DIAGNOSIS — E2740 Unspecified adrenocortical insufficiency: Secondary | ICD-10-CM | POA: Insufficient documentation

## 2015-08-10 DIAGNOSIS — I5030 Unspecified diastolic (congestive) heart failure: Secondary | ICD-10-CM | POA: Diagnosis not present

## 2015-08-10 DIAGNOSIS — Z6841 Body Mass Index (BMI) 40.0 and over, adult: Secondary | ICD-10-CM | POA: Diagnosis not present

## 2015-08-10 DIAGNOSIS — K219 Gastro-esophageal reflux disease without esophagitis: Secondary | ICD-10-CM | POA: Diagnosis not present

## 2015-08-10 DIAGNOSIS — E1142 Type 2 diabetes mellitus with diabetic polyneuropathy: Secondary | ICD-10-CM | POA: Diagnosis not present

## 2015-08-10 DIAGNOSIS — K2950 Unspecified chronic gastritis without bleeding: Secondary | ICD-10-CM | POA: Insufficient documentation

## 2015-08-10 HISTORY — PX: ESOPHAGOGASTRODUODENOSCOPY: SHX5428

## 2015-08-10 LAB — GLUCOSE, CAPILLARY: GLUCOSE-CAPILLARY: 232 mg/dL — AB (ref 65–99)

## 2015-08-10 SURGERY — EGD (ESOPHAGOGASTRODUODENOSCOPY)
Anesthesia: Moderate Sedation

## 2015-08-10 MED ORDER — LIDOCAINE VISCOUS 2 % MT SOLN
OROMUCOSAL | Status: AC
  Filled 2015-08-10: qty 15

## 2015-08-10 MED ORDER — LIDOCAINE VISCOUS 2 % MT SOLN
OROMUCOSAL | Status: DC | PRN
  Administered 2015-08-10: 1 via OROMUCOSAL

## 2015-08-10 MED ORDER — MIDAZOLAM HCL 5 MG/5ML IJ SOLN
INTRAMUSCULAR | Status: DC | PRN
Start: 2015-08-10 — End: 2015-08-10
  Administered 2015-08-10: 1 mg via INTRAVENOUS
  Administered 2015-08-10 (×2): 2 mg via INTRAVENOUS

## 2015-08-10 MED ORDER — STERILE WATER FOR IRRIGATION IR SOLN
Status: DC | PRN
  Administered 2015-08-10: 2.5 mL

## 2015-08-10 MED ORDER — OMEPRAZOLE 20 MG PO CPDR: 20.0000 mg | DELAYED_RELEASE_CAPSULE | Freq: Two times a day (BID) | ORAL | Status: DC

## 2015-08-10 MED ORDER — MEPERIDINE HCL 100 MG/ML IJ SOLN
INTRAMUSCULAR | Status: AC
  Filled 2015-08-10: qty 2

## 2015-08-10 MED ORDER — MEPERIDINE HCL 100 MG/ML IJ SOLN
INTRAMUSCULAR | Status: DC | PRN
  Administered 2015-08-10 (×2): 25 mg via INTRAVENOUS
  Administered 2015-08-10: 50 mg via INTRAVENOUS

## 2015-08-10 MED ORDER — SODIUM CHLORIDE 0.9 % IV SOLN
INTRAVENOUS | Status: DC
  Administered 2015-08-10: 10:00:00 via INTRAVENOUS

## 2015-08-10 MED ORDER — MIDAZOLAM HCL 5 MG/5ML IJ SOLN
INTRAMUSCULAR | Status: AC
  Filled 2015-08-10: qty 10

## 2015-08-10 NOTE — Telephone Encounter (Signed)
Pt notified and agrees. 

## 2015-08-10 NOTE — Discharge Instructions (Signed)
You have MODERATE gastritis DUE TO ASPIRIN USE. IT IS CAUSING TO YOUR ABDOMINAL PAIN. . I biopsied your stomach. I PLACED A CLIP TO PREVENT BLEEDING. THE ASPIRIN AND EXECEDRIN MIGRAINE IS CAUSING TO YOU TO BE PRONE TO BLEEDING.   NO MRI FOR 30 DAYS DUE TO METAL CLIP PLACEMENT IN THE STOMACH.  CONTINUE YOUR WEIGHT LOSS EFFORTS.   FOLLOW A LOW FAT DIET. MEATS SHOULD BE BAKED, BROILED, OR BOILED. AVOID FRIED FOODS. SEE INFO BELOW.  INCREASE OMEPRAZOLE 30 MINUTES PRIOR TO MEALS TWICE DAILY.  AVOID TRIGGERS FOR GASTRITIS. NO ASPIRIN, BC/GOODY POWDERS, IBUPROFEN/MOTRIN, OR NAPROXEN/ALEVE UNLESS IT IS MEDICALLY NECESSARY. SEE INFO BELOW.  YOUR BIOPSY RESULTS WILL BE AVAILABLE IN MY CHART MAR 20 AND MY OFFICE WILL CONTACT YOU IN 10-14 DAYS WITH YOUR RESULTS.    FOLLOW UP IN 4 MOS.  UPPER ENDOSCOPY AFTER CARE Read the instructions outlined below and refer to this sheet in the next week. These discharge instructions provide you with general information on caring for yourself after you leave the hospital. While your treatment has been planned according to the most current medical practices available, unavoidable complications occasionally occur. If you have any problems or questions after discharge, call DR. Maisyn Nouri, [phone].  ACTIVITY  You may resume your regular activity, but move at a slower pace for the next 24 hours.   Take frequent rest periods for the next 24 hours.   Walking will help get rid of the air and reduce the bloated feeling in your belly (abdomen).   No driving for 24 hours (because of the medicine (anesthesia) used during the test).   You may shower.   Do not sign any important legal documents or operate any machinery for 24 hours (because of the anesthesia used during the test).    NUTRITION  Drink plenty of fluids.   You may resume your normal diet as instructed by your doctor.   Begin with a light meal and progress to your normal diet. Heavy or fried foods  are harder to digest and may make you feel sick to your stomach (nauseated).   Avoid alcoholic beverages for 24 hours or as instructed.    MEDICATIONS  You may resume your normal medications.   WHAT YOU CAN EXPECT TODAY  Some feelings of bloating in the abdomen.   Passage of more gas than usual.    IF YOU HAD A BIOPSY TAKEN DURING THE UPPER ENDOSCOPY:  Eat a soft diet IF YOU HAVE NAUSEA, BLOATING, ABDOMINAL PAIN, OR VOMITING.    FINDING OUT THE RESULTS OF YOUR TEST Not all test results are available during your visit. DR. Oneida Alar WILL CALL YOU WITHIN 14 DAYS OF YOUR PROCEDUE WITH YOUR RESULTS. Do not assume everything is normal if you have not heard from DR. Shante Maysonet, CALL HER OFFICE AT [phone].  SEEK IMMEDIATE MEDICAL ATTENTION AND CALL THE OFFICE: [phone] IF:  You have more than a spotting of blood in your stool.   Your belly is swollen (abdominal distention).   You are nauseated or vomiting.   You have a temperature over 101F.   You have abdominal pain or discomfort that is severe or gets worse throughout the day.   Gastritis  Gastritis is an inflammation (the body's way of reacting to injury and/or infection) of the stomach. It is often caused by viral or bacterial (germ) infections. It can also be caused BY ASPIRIN, BC/GOODY POWDER'S, (IBUPROFEN) MOTRIN, OR ALEVE (NAPROXEN), chemicals (including alcohol), SPICY FOODS, and medications. This illness  may be associated with generalized malaise (feeling tired, not well), UPPER ABDOMINAL STOMACH cramps/PAIN, and fever. One common bacterial cause of gastritis is an organism known as H. Pylori. This can be treated with antibiotics.    Low-Fat Diet  BREADS, CEREALS, PASTA, RICE, DRIED PEAS, AND BEANS These products are high in carbohydrates and most are low in fat. Therefore, they can be increased in the diet as substitutes for fatty foods. They too, however, contain calories and should not be eaten in excess. Cereals  can be eaten for snacks as well as for breakfast.  Include foods that contain fiber (fruits, vegetables, whole grains, and legumes). Research shows that fiber may lower blood cholesterol levels, especially the water-soluble fiber found in fruits, vegetables, oat products, and legumes.  FRUITS AND VEGETABLES It is good to eat fruits and vegetables. Besides being sources of fiber, both are rich in vitamins and some minerals. They help you get the daily allowances of these nutrients. Fruits and vegetables can be used for snacks and desserts.  MEATS Limit lean meat, chicken, [place], and fish to no more than 6 ounces per day.  Beef, Pork, and Lamb Use lean cuts of beef, pork, and lamb. Lean cuts include:  Extra-lean ground beef.  Arm roast.  Sirloin tip.  Center-cut ham.  Round steak.  Loin chops.  Rump roast.  Tenderloin.  Trim all fat off the outside of meats before cooking. It is not necessary to severely decrease the intake of red meat, but lean choices should be made. Lean meat is rich in protein and contains a highly absorbable form of iron. Premenopausal women, in particular, should avoid reducing lean red meat because this could increase the risk for low red blood cells (iron-deficiency anemia).  Chicken and [place] These are good sources of protein. The fat of poultry can be reduced by removing the skin and underlying fat layers before cooking. Chicken and [place] can be substituted for lean red meat in the diet. Poultry should not be fried or covered with high-fat sauces.  Fish and Shellfish Fish is a good source of protein. Shellfish contain cholesterol, but they usually are low in saturated fatty acids. The preparation of fish is important. Like chicken and [place], they should not be fried or covered with high-fat sauces.  EGGS Egg whites contain no fat or cholesterol. They can be eaten often. Try 1 to 2 egg whites instead of whole eggs in recipes or use egg substitutes that do not  contain yolk.  MILK AND DAIRY PRODUCTS Use skim or 1% milk instead of 2% or whole milk. Decrease whole milk, natural, and processed cheeses. Use nonfat or low-fat (2%) cottage cheese or low-fat cheeses made from vegetable oils. Choose nonfat or low-fat (1 to 2%) yogurt. Experiment with evaporated skim milk in recipes that call for heavy cream. Substitute low-fat yogurt or low-fat cottage cheese for sour cream in dips and salad dressings. Have at least 2 servings of low-fat dairy products, such as 2 glasses of skim (or 1%) milk each day to help get your daily calcium intake.  FATS AND OILS Reduce the total intake of fats, especially saturated fat. Butterfat, lard, and beef fats are high in saturated fat and cholesterol. These should be avoided as much as possible. Vegetable fats do not contain cholesterol, but certain vegetable fats, such as coconut oil, palm oil, and palm kernel oil are very high in saturated fats. These should be limited. These fats are often used in [employer], processed foods,  popcorn, oils, and nondairy creamers. Vegetable shortenings and some peanut butters contain hydrogenated oils, which are also saturated fats. Read the labels on these foods and check for saturated vegetable oils.  Unsaturated vegetable oils and fats do not raise blood cholesterol. However, they should be limited because they are fats and are high in calories. Total fat should still be limited to 30% of your daily caloric intake. Desirable liquid vegetable oils are corn oil, cottonseed oil, olive oil, canola oil, safflower oil, soybean oil, and sunflower oil. Peanut oil is not as good, but small amounts are acceptable. Buy a heart-healthy tub margarine that has no partially hydrogenated oils in the ingredients. Mayonnaise and salad dressings often are made from unsaturated fats, but they should also be limited because of their high calorie and fat content. Seeds, nuts, peanut butter, olives, and avocados are high  in fat, but the fat is mainly the unsaturated type. These foods should be limited mainly to avoid excess calories and fat.  OTHER EATING TIPS Snacks  Most sweets should be limited as snacks. They tend to be rich in calories and fats, and their caloric content outweighs their nutritional value. Some good choices in snacks are graham crackers, melba toast, soda crackers, bagels (no egg), English muffins, fruits, and vegetables. These snacks are preferable to snack crackers, [place] fries, and chips. Popcorn should be air-popped or cooked in small amounts of liquid vegetable oil.  Desserts Eat fruit, low-fat yogurt, and fruit ices instead of pastries, cake, and cookies. Sherbet, angel food cake, gelatin dessert, frozen low-fat yogurt, or other frozen products that do not contain saturated fat (pure fruit juice bars, frozen ice pops) are also acceptable.   COOKING METHODS Choose those methods that use little or no fat. They include: Poaching.  Braising.  Steaming.  Grilling.  Baking.  Stir-frying.  Broiling.  Microwaving.  Foods can be cooked in a nonstick pan without added fat, or use a nonfat cooking spray in regular cookware. Limit fried foods and avoid frying in saturated fat. Add moisture to lean meats by using water, broth, cooking wines, and other nonfat or low-fat sauces along with the cooking methods mentioned above. Soups and stews should be chilled after cooking. The fat that forms on top after a few hours in the refrigerator should be skimmed off. When preparing meals, avoid using excess salt. Salt can contribute to raising blood pressure in some people.  EATING AWAY FROM HOME Order entres, potatoes, and vegetables without sauces or butter. When meat exceeds the size of a deck of cards (3 to 4 ounces), the rest can be taken home for another meal. Choose vegetable or fruit salads and ask for low-calorie salad dressings to be served on the side. Use dressings sparingly. Limit high-fat  toppings, such as bacon, crumbled eggs, cheese, sunflower seeds, and olives. Ask for heart-healthy tub margarine instead of butter.

## 2015-08-10 NOTE — Progress Notes (Signed)
REVIEWED-NO ADDITIONAL RECOMMENDATIONS. 
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
## 2015-08-11 NOTE — Op Note (Signed)
[facility] Patient Name: Ann [name] Procedure Date: 08/10/2015 11:14 AM MRN: [id number] Date of Birth: 25-Apr-1946 Attending MD: Barney Drain , MD CSN: [id number] Age: 70 Admit Type: Outpatient Procedure:                Upper GI endoscopy Indications:              Dyspepsia Providers:                Barney Drain, MD, Lurline Del, RN, Zoila Shutter,                            Technologist Referring MD:             Jasper Loser. Luan Pulling, MD (Referring MD) Medicines:                Meperidine 100 mg IV, Midazolam 5 mg IV Complications:            No immediate complications. Estimated blood loss:                            Minimal. Estimated Blood Loss:     Estimated blood loss was minimal. Procedure:                Pre-Anesthesia Assessment:                           - Prior to the procedure, a History and Physical                            was performed, and patient medications and                            allergies were reviewed. The patient's tolerance of                            previous anesthesia was also reviewed. The risks                            and benefits of the procedure and the sedation                            options and risks were discussed with the patient.                            All questions were answered, and informed consent                            was obtained. Prior Anticoagulants: The patient has                            taken aspirin, last dose was day of procedure. ASA                            Grade Assessment: II - A patient with mild systemic  disease. After reviewing the risks and benefits,                            the patient was deemed in satisfactory condition to                            undergo the procedure.                           After obtaining informed consent, the endoscope was                            passed under direct vision. Throughout the                            procedure, the  patient's blood pressure, pulse, and                            oxygen saturations were monitored continuously. The                            [id number] scope was introduced through the                            mouth, and advanced to the second part of duodenum.                            The upper GI endoscopy was accomplished without                            difficulty. The patient tolerated the procedure                            well. Scope In: 11:50:50 AM Scope Out: 12:00:05 PM Total Procedure Duration: 0 hours 9 minutes 15 seconds  Findings:      The examined esophagus was normal.      Diffuse moderate inflammation characterized by erosions, erythema and       friability was found in the gastric fundus, in the gastric body and in       the gastric antrum. Biopsies were taken with a cold forceps for       histology. Estimated blood loss: 20 mL requiring treatment with       placement of hemostatic clip(s).      The duodenal bulb and second portion of the duodenum were normal. Impression:               - Normal esophagus.                           - Gastritis MOST LIKEL CAUSE FOR UPPER ABDOMINAL                            PAIN AND DUE TO ASA USE.                           -  Normal duodenal bulb and second portion of the                            duodenum. Moderate Sedation:      Moderate (conscious) sedation was administered by the endoscopy nurse       and supervised by the endoscopist. The following parameters were       monitored: oxygen saturation, heart rate, blood pressure, respiratory       rate, EKG, adequacy of pulmonary ventilation, and response to care.       Total physician intraservice time was 26 minutes. Recommendation:           - Patient has a contact number available for                            emergencies. The signs and symptoms of potential                            delayed complications were discussed with the                             patient. Return to normal activities tomorrow.                            Written discharge instructions were provided to the                            patient.                           - Low fat diet.                           - No aspirin, ibuprofen, naproxen, or other                            non-steroidal anti-inflammatory drugs for 7 days                            after biopsy.                           - Await pathology results.                           - Return to GI office in 4 months.                           NO MRI FOR 30 DAYS DUE TO METAL CLIP PLACEMENT IN                            THE STOMACH.                           CONTINUE YOUR WEIGHT LOSS EFFORTS.  INCREASE OMEPRAZOLE 30 MINUTES PRIOR TO MEALS TWICE                            DAILY.                           AVOID TRIGGERS FOR GASTRITIS. Procedure Code(s):        --- Professional ---                           [phone], Esophagogastroduodenoscopy, flexible,                            transoral; with biopsy, single or multiple                           [id number], Moderate sedation services; each additional                            15 minutes intraservice time                           G0500, Moderate sedation services provided by the                            same physician or other qualified health care                            professional performing a gastrointestinal                            endoscopic service that sedation supports,                            requiring the presence of an independent trained                            observer to assist in the monitoring of the                            patient's level of consciousness and physiological                            status; initial 15 minutes of intra-service time;                            patient age 57 years or older (additional time may                            be reported with [phone], as appropriate) Diagnosis  Code(s):        --- Professional ---                           K29.70, Gastritis, unspecified, without bleeding  R10.13, Epigastric pain CPT copyright 2016 [employer]. All rights reserved. The codes documented in this report are preliminary and upon coder review may  be revised to meet current compliance requirements. Barney Drain, MD Barney Drain, MD 08/11/2015 2:53:16 PM This report has been signed electronically. Number of Addenda: 0

## 2015-08-14 ENCOUNTER — Encounter (HOSPITAL_COMMUNITY)
Admission: RE | Admit: 2015-08-14 | Discharge: 2015-08-14 | Disposition: A | Discharge status: Home / Self Care | Payer: Medicare Other | Source: Ambulatory Visit | Attending: Adult Health | Admitting: Adult Health

## 2015-08-14 ENCOUNTER — Ambulatory Visit (HOSPITAL_COMMUNITY)
Admission: RE | Admit: 2015-08-14 | Discharge: 2015-08-14 | Disposition: A | Discharge status: Home / Self Care | Payer: Medicare Other | Source: Ambulatory Visit | Attending: Adult Health | Admitting: Adult Health

## 2015-08-14 ENCOUNTER — Inpatient Hospital Stay (HOSPITAL_COMMUNITY): Admission: RE | Admit: 2015-08-14 | Payer: Medicare Other | Source: Ambulatory Visit

## 2015-08-14 ENCOUNTER — Telehealth: Payer: Self-pay | Admitting: *Deleted

## 2015-08-14 ENCOUNTER — Encounter (HOSPITAL_COMMUNITY): Payer: Self-pay

## 2015-08-14 DIAGNOSIS — I10 Essential (primary) hypertension: Secondary | ICD-10-CM | POA: Insufficient documentation

## 2015-08-14 DIAGNOSIS — I2510 Atherosclerotic heart disease of native coronary artery without angina pectoris: Secondary | ICD-10-CM | POA: Insufficient documentation

## 2015-08-14 DIAGNOSIS — E119 Type 2 diabetes mellitus without complications: Secondary | ICD-10-CM | POA: Insufficient documentation

## 2015-08-14 DIAGNOSIS — E785 Hyperlipidemia, unspecified: Secondary | ICD-10-CM | POA: Insufficient documentation

## 2015-08-14 LAB — NM MYOCAR MULTI W/SPECT W/WALL MOTION / EF
CHL CUP NUCLEAR SSS: 9
LV dias vol: 70 mL (ref 46–106)
LVSYSVOL: 29 mL
MPHR: 80 {beats}/min
NUC STRESS TID: 1.05
RATE: 1.05
Rest HR: 60 {beats}/min
SDS: 7
SRS: 2

## 2015-08-14 MED ORDER — TECHNETIUM TC 99M SESTAMIBI GENERIC - CARDIOLITE
10.0000 | Freq: Once | INTRAVENOUS | Status: AC | PRN
  Administered 2015-08-14: 9.5 via INTRAVENOUS

## 2015-08-14 MED ORDER — REGADENOSON 0.4 MG/5ML IV SOLN
INTRAVENOUS | Status: AC
  Administered 2015-08-14: 0.4 mg
  Filled 2015-08-14: qty 5

## 2015-08-14 MED ORDER — TECHNETIUM TC 99M SESTAMIBI - CARDIOLITE
30.0000 | Freq: Once | INTRAVENOUS | Status: AC | PRN
  Administered 2015-08-14: 32 via INTRAVENOUS

## 2015-08-14 NOTE — Telephone Encounter (Signed)
-----   Message from Lendon Colonel, NP sent at 08/14/2015  1:12 PM EDT ----- Essentially ormal echo. No changes in medical regimen.

## 2015-08-14 NOTE — Telephone Encounter (Signed)
Called patient with test results. No answer. Left message to call back.  

## 2015-08-15 ENCOUNTER — Encounter (HOSPITAL_COMMUNITY): Payer: Self-pay | Admitting: Gastroenterology

## 2015-08-17 ENCOUNTER — Ambulatory Visit (INDEPENDENT_AMBULATORY_CARE_PROVIDER_SITE_OTHER): Payer: Medicare Other | Admitting: "Endocrinology

## 2015-08-17 ENCOUNTER — Encounter: Payer: Self-pay | Admitting: "Endocrinology

## 2015-08-17 VITALS — BP 102/62 | HR 66 | Resp 18 | Wt 262.6 lb

## 2015-08-17 DIAGNOSIS — IMO0002 Reserved for concepts with insufficient information to code with codable children: Secondary | ICD-10-CM

## 2015-08-17 DIAGNOSIS — K2900 Acute gastritis without bleeding: Secondary | ICD-10-CM | POA: Diagnosis not present

## 2015-08-17 DIAGNOSIS — E1159 Type 2 diabetes mellitus with other circulatory complications: Secondary | ICD-10-CM | POA: Diagnosis not present

## 2015-08-17 DIAGNOSIS — I2510 Atherosclerotic heart disease of native coronary artery without angina pectoris: Secondary | ICD-10-CM

## 2015-08-17 DIAGNOSIS — E1165 Type 2 diabetes mellitus with hyperglycemia: Secondary | ICD-10-CM

## 2015-08-17 DIAGNOSIS — E039 Hypothyroidism, unspecified: Secondary | ICD-10-CM

## 2015-08-17 DIAGNOSIS — G43909 Migraine, unspecified, not intractable, without status migrainosus: Secondary | ICD-10-CM | POA: Diagnosis not present

## 2015-08-17 DIAGNOSIS — E2740 Unspecified adrenocortical insufficiency: Secondary | ICD-10-CM | POA: Diagnosis not present

## 2015-08-17 DIAGNOSIS — Z794 Long term (current) use of insulin: Secondary | ICD-10-CM | POA: Diagnosis not present

## 2015-08-17 DIAGNOSIS — E1140 Type 2 diabetes mellitus with diabetic neuropathy, unspecified: Secondary | ICD-10-CM | POA: Diagnosis not present

## 2015-08-17 DIAGNOSIS — I10 Essential (primary) hypertension: Secondary | ICD-10-CM | POA: Diagnosis not present
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
## 2015-08-17 NOTE — Patient Instructions (Signed)

## 2015-08-23 ENCOUNTER — Other Ambulatory Visit (HOSPITAL_COMMUNITY): Payer: Self-pay | Admitting: Respiratory Therapy

## 2015-08-23 DIAGNOSIS — G4730 Sleep apnea, unspecified: Secondary | ICD-10-CM

## 2015-08-28 DIAGNOSIS — I10 Essential (primary) hypertension: Secondary | ICD-10-CM | POA: Diagnosis not present

## 2015-08-28 DIAGNOSIS — E1140 Type 2 diabetes mellitus with diabetic neuropathy, unspecified: Secondary | ICD-10-CM | POA: Diagnosis not present

## 2015-08-28 DIAGNOSIS — H1033 Unspecified acute conjunctivitis, bilateral: Secondary | ICD-10-CM | POA: Diagnosis not present

## 2015-09-10 ENCOUNTER — Telehealth: Payer: Self-pay | Admitting: Gastroenterology

## 2015-09-10 NOTE — Telephone Encounter (Signed)
Please call pt. HER stomach Bx shows gastritis DUE TO ASA USE.    NO MRI UNTIL AFTER APR 16 DUE TO METAL CLIP PLACEMENT IN THE STOMACH.  CONTINUE YOUR WEIGHT LOSS EFFORTS.   FOLLOW A LOW FAT DIET. MEATS SHOULD BE BAKED, BROILED, OR BOILED. AVOID FRIED FOODS.   INCREASE OMEPRAZOLE 30 MINUTES PRIOR TO MEALS TWICE DAILY.  AVOID TRIGGERS FOR GASTRITIS. NO ASPIRIN, BC/GOODY POWDERS, IBUPROFEN/MOTRIN, OR NAPROXEN/ALEVE UNLESS IT IS MEDICALLY NECESSARY.   FOLLOW UP IN 4 MOS E30 DYSPEPSIA/EPIGASTRIC PAIN/NAUSEA.

## 2015-09-11 NOTE — Telephone Encounter (Signed)
APPT MADE

## 2015-09-11 NOTE — Telephone Encounter (Signed)
Pt and daughter-in-law, Baxter Flattery are aware of results.

## 2015-09-14 ENCOUNTER — Other Ambulatory Visit (HOSPITAL_COMMUNITY): Payer: Self-pay | Admitting: Respiratory Therapy

## 2015-09-14 DIAGNOSIS — G4730 Sleep apnea, unspecified: Secondary | ICD-10-CM

## 2015-10-05 ENCOUNTER — Ambulatory Visit: Payer: Medicare Other | Attending: Pulmonary Disease | Admitting: Neurology

## 2015-10-05 DIAGNOSIS — G4730 Sleep apnea, unspecified: Secondary | ICD-10-CM | POA: Diagnosis present

## 2015-10-05 DIAGNOSIS — Z794 Long term (current) use of insulin: Secondary | ICD-10-CM | POA: Insufficient documentation

## 2015-10-05 DIAGNOSIS — Z79899 Other long term (current) drug therapy: Secondary | ICD-10-CM | POA: Diagnosis not present

## 2015-10-05 DIAGNOSIS — G4733 Obstructive sleep apnea (adult) (pediatric): Secondary | ICD-10-CM | POA: Insufficient documentation

## 2015-10-06 DIAGNOSIS — L603 Nail dystrophy: Secondary | ICD-10-CM | POA: Diagnosis not present

## 2015-10-06 DIAGNOSIS — L851 Acquired keratosis [keratoderma] palmaris et plantaris: Secondary | ICD-10-CM | POA: Diagnosis not present

## 2015-10-06 DIAGNOSIS — E1142 Type 2 diabetes mellitus with diabetic polyneuropathy: Secondary | ICD-10-CM | POA: Diagnosis not present

## 2015-10-12 NOTE — Progress Notes (Signed)
Previous CT with evidence of fatty liver. Requested most recent LFTs from PCP, dated March 2015. Total bilirubin 0.3, alkaline phosphatase 172, AST 20, ALT 22, albumin 3.6. Patient is scheduled to come back to see me in approximate 6 weeks. We will update LFTs at that time.

## 2015-10-15 NOTE — Procedures (Signed)
[place] A. Merlene Laughter, MD     [email or web address]             NOCTURNAL POLYSOMNOGRAPHY   LOCATION: [facility]  Patient Name: Ann [name], Ann [name] Date: 10/05/2015 Gender: Female D.O.B: 07/05/1945 Age (years): 80 Referring Provider: Not Available Height (inches): 63 Interpreting Physician: Phillips Odor MD, ABSM Weight (lbs): 262 RPSGT: Peak, Robert BMI: 46 MRN: [id number] Neck Size: 17.25 CLINICAL INFORMATION Sleep Study Type: NPSG Indication for sleep study: N/A Epworth Sleepiness Score: 3 SLEEP STUDY TECHNIQUE As per the AASM Manual for the Scoring of Sleep and Associated Events v2.3 (April 2016) with a hypopnea requiring 4% desaturations. The channels recorded and monitored were frontal, central and occipital EEG, electrooculogram (EOG), submentalis EMG (chin), nasal and oral airflow, thoracic and abdominal wall motion, anterior tibialis EMG, snore microphone, electrocardiogram, and pulse oximetry. MEDICATIONS Patient's medications include: N/A. Medications self-administered by patient during sleep study : No sleep medicine administered.  Current outpatient prescriptions:  .  albuterol (PROVENTIL HFA;VENTOLIN HFA) 108 (90 BASE) MCG/ACT inhaler, Inhale 2 puffs into the lungs every 4 (four) hours as needed for wheezing., Disp: , Rfl:  .  amLODipine (NORVASC) 5 MG tablet, Take 1 tablet (5 mg total) by mouth daily., Disp: 30 tablet, Rfl: 3 .  atorvastatin (LIPITOR) 80 MG tablet, Take 1 tablet (80 mg total) by mouth daily at 6 PM., Disp: 30 tablet, Rfl: 12 .  baclofen (LIORESAL) 10 MG tablet, Take 10 mg by mouth daily as needed for muscle spasms., Disp: , Rfl:  .  carvedilol (COREG) 6.25 MG tablet, Take 6.25 mg by mouth 2 (two) times daily with a meal., Disp: , Rfl:  .  diltiazem (CARDIZEM) 120 MG tablet, Take 120 mg by mouth daily., Disp: , Rfl:  .  docusate sodium (COLACE) 50 MG capsule, Take 100 mg by mouth 2 (two) times daily., Disp: , Rfl:  .   furosemide (LASIX) 40 MG tablet, Take 40 mg by mouth daily., Disp: , Rfl:  .  gabapentin (NEURONTIN) 100 MG capsule, Take 1 capsule (100 mg total) by mouth 3 (three) times daily., Disp: , Rfl:  .  gabapentin (NEURONTIN) 400 MG capsule, Take 400 mg by mouth 3 (three) times daily., Disp: , Rfl:  .  hydrALAZINE (APRESOLINE) 50 MG tablet, Take 1 tablet (50 mg total) by mouth 3 (three) times daily., Disp: 120 tablet, Rfl: 12 .  Insulin Detemir (LEVEMIR FLEXTOUCH) 100 UNIT/ML Pen, Inject 20 Units into the skin daily at 10 pm., Disp: 15 mL, Rfl: 2 .  Insulin Pen Needle (B-D ULTRAFINE III SHORT PEN) 31G X 8 MM MISC, 1 each by Does not apply route as directed., Disp: 50 each, Rfl: 3 .  Lancets MISC, 1 each by Does not apply route as directed., Disp: 100 each, Rfl: 3 .  levothyroxine (SYNTHROID, LEVOTHROID) 200 MCG tablet, Take 200 mcg by mouth daily before breakfast. 150 mcg one tablet by mouth daily., Disp: , Rfl:  .  loratadine (CLARITIN) 10 MG tablet, Take 10 mg by mouth daily., Disp: , Rfl:  .  metFORMIN (GLUMETZA) 500 MG (MOD) 24 hr tablet, Take 500 mg by mouth 2 (two) times daily with a meal. , Disp: , Rfl:  .  nitroGLYCERIN (NITROSTAT) 0.4 MG SL tablet, Place 1 tablet (0.4 mg total) under the tongue every 5 (five) minutes as needed for chest pain., Disp: , Rfl: 12 .  omeprazole (PRILOSEC) 20 MG capsule, Take 1 capsule (20 mg total) by mouth 2 (two) times daily before  a meal., Disp: 60 capsule, Rfl: 11 .  oxybutynin (DITROPAN) 5 MG tablet, Take 5 mg by mouth 2 (two) times daily., Disp: , Rfl:  .  potassium chloride (K-DUR) 10 MEQ tablet, Take 10 mEq by mouth 2 (two) times daily., Disp: , Rfl:  .  predniSONE (DELTASONE) 10 MG tablet, Take 10 mg by mouth daily with breakfast., Disp: , Rfl:  .  sertraline (ZOLOFT) 100 MG tablet, Take 0.5 tablets (50 mg total) by mouth at bedtime., Disp: 30 tablet, Rfl: 11 .  solifenacin (VESICARE) 5 MG tablet, Take 5 mg by mouth daily., Disp: , Rfl:  .  vitamin B-12 1000  MCG tablet, Take 1 tablet (1,000 mcg total) by mouth daily., Disp: , Rfl:   SLEEP ARCHITECTURE The study was initiated at 9:20:35 PM and ended at 4:41:54 AM. Sleep onset time was 0.5 minutes and the sleep efficiency was 80.1%. The total sleep time was 353.3 minutes. Stage REM latency was 358.0 minutes. The patient spent 22.22% of the night in stage N1 sleep, 71.13% in stage N2 sleep, 2.12% in stage N3 and 4.53% in REM. Alpha intrusion was absent. Supine sleep was 29.72%. RESPIRATORY PARAMETERS The overall apnea/hypopnea index (AHI) was 7.3 per hour. There were 17 total apneas, including 14 obstructive, 2 central and 1 mixed apneas. There were 26 hypopneas and 53 RERAs. The AHI during Stage REM sleep was 37.5 per hour. AHI while supine was 1.7 per hour. The mean oxygen saturation was 88.33%. The minimum SpO2 during sleep was 81.00%. Soft snoring was noted during this study. CARDIAC DATA The 2 lead EKG demonstrated sinus rhythm. The mean heart rate was 55.25 beats per minute. Other EKG findings include: PVCs. LEG MOVEMENT DATA The total PLMS were 0 with a resulting PLMS index of 0.00. Associated arousal with leg movement index was 0.0.   IMPRESSIONS - Mild obstructive sleep apnea not requiring positive treatment.   Delano Metz, MD Diplomate, American Board of Sleep Medicine.

## 2015-10-16 ENCOUNTER — Encounter: Payer: Self-pay | Admitting: Cardiovascular Disease

## 2015-10-16 ENCOUNTER — Ambulatory Visit (INDEPENDENT_AMBULATORY_CARE_PROVIDER_SITE_OTHER): Payer: Medicare Other | Admitting: Cardiovascular Disease

## 2015-10-16 VITALS — BP 130/80 | HR 73 | Ht 64.0 in | Wt 263.0 lb

## 2015-10-16 DIAGNOSIS — I2510 Atherosclerotic heart disease of native coronary artery without angina pectoris: Secondary | ICD-10-CM

## 2015-10-16 DIAGNOSIS — I480 Paroxysmal atrial fibrillation: Secondary | ICD-10-CM

## 2015-10-16 DIAGNOSIS — I5032 Chronic diastolic (congestive) heart failure: Secondary | ICD-10-CM

## 2015-10-16 DIAGNOSIS — Z955 Presence of coronary angioplasty implant and graft: Secondary | ICD-10-CM | POA: Diagnosis not present

## 2015-10-16 DIAGNOSIS — E7800 Pure hypercholesterolemia, unspecified: Secondary | ICD-10-CM

## 2015-10-16 DIAGNOSIS — I10 Essential (primary) hypertension: Secondary | ICD-10-CM
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
## 2015-10-16 NOTE — Patient Instructions (Signed)
Your physician wants you to follow-up in: 1 year You will receive a reminder letter in the mail two months in advance. If you don't receive a letter, please call our office to schedule the follow-up appointment.   Your physician recommends that you continue on your current medications as directed. Please refer to the Current Medication list given to you today.    If you need a refill on your cardiac medications before your next appointment, please call your pharmacy.      Thank you for choosing [facility] !        

## 2015-10-30 DIAGNOSIS — N390 Urinary tract infection, site not specified: Secondary | ICD-10-CM | POA: Diagnosis not present

## 2015-11-09 ENCOUNTER — Other Ambulatory Visit: Payer: Self-pay | Admitting: "Endocrinology

## 2015-11-09 DIAGNOSIS — E1159 Type 2 diabetes mellitus with other circulatory complications: Secondary | ICD-10-CM | POA: Diagnosis not present

## 2015-11-09 DIAGNOSIS — E039 Hypothyroidism, unspecified: Secondary | ICD-10-CM | POA: Diagnosis not present

## 2015-11-09 DIAGNOSIS — Z794 Long term (current) use of insulin: Secondary | ICD-10-CM | POA: Diagnosis not present

## 2015-11-09 DIAGNOSIS — E1165 Type 2 diabetes mellitus with hyperglycemia: Secondary | ICD-10-CM | POA: Diagnosis not present

## 2015-11-09 LAB — T4, FREE: Free T4: 1.4 ng/dL (ref 0.8–1.8)

## 2015-11-10 LAB — BASIC METABOLIC PANEL
BUN: 13 mg/dL (ref 7–25)
CALCIUM: 9.1 mg/dL (ref 8.6–10.4)
CHLORIDE: 106 mmol/L (ref 98–110)
CO2: 22 mmol/L (ref 20–31)
CREATININE: 0.75 mg/dL (ref 0.60–0.93)
Glucose, Bld: 211 mg/dL — ABNORMAL HIGH (ref 65–99)
Potassium: 5.1 mmol/L (ref 3.5–5.3)
Sodium: 138 mmol/L (ref 135–146)

## 2015-11-10 LAB — HEMOGLOBIN A1C
HEMOGLOBIN A1C: 7.7 % — AB (ref <5.7)
Mean Plasma Glucose: 174 mg/dL

## 2015-11-20 ENCOUNTER — Encounter: Payer: Self-pay | Admitting: "Endocrinology

## 2015-11-20 ENCOUNTER — Ambulatory Visit (INDEPENDENT_AMBULATORY_CARE_PROVIDER_SITE_OTHER): Payer: Medicare Other | Admitting: "Endocrinology

## 2015-11-20 VITALS — BP 107/70 | HR 99 | Ht 64.0 in | Wt 265.0 lb

## 2015-11-20 DIAGNOSIS — E039 Hypothyroidism, unspecified: Secondary | ICD-10-CM | POA: Diagnosis not present

## 2015-11-20 DIAGNOSIS — Z794 Long term (current) use of insulin: Secondary | ICD-10-CM | POA: Diagnosis not present

## 2015-11-20 DIAGNOSIS — E1165 Type 2 diabetes mellitus with hyperglycemia: Secondary | ICD-10-CM

## 2015-11-20 DIAGNOSIS — I2510 Atherosclerotic heart disease of native coronary artery without angina pectoris: Secondary | ICD-10-CM | POA: Diagnosis not present

## 2015-11-20 DIAGNOSIS — E1159 Type 2 diabetes mellitus with other circulatory complications: Secondary | ICD-10-CM | POA: Diagnosis not present

## 2015-11-20 DIAGNOSIS — E785 Hyperlipidemia, unspecified: Secondary | ICD-10-CM

## 2015-11-20 DIAGNOSIS — IMO0002 Reserved for concepts with insufficient information to code with codable children: Secondary | ICD-10-CM

## 2015-11-20 DIAGNOSIS — I10 Essential (primary) hypertension: Secondary | ICD-10-CM
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
## 2015-11-20 NOTE — Patient Instructions (Signed)

## 2015-11-22 DIAGNOSIS — N3281 Overactive bladder: Secondary | ICD-10-CM | POA: Diagnosis not present

## 2015-11-22 DIAGNOSIS — I110 Hypertensive heart disease with heart failure: Secondary | ICD-10-CM | POA: Diagnosis not present

## 2015-11-22 DIAGNOSIS — E1140 Type 2 diabetes mellitus with diabetic neuropathy, unspecified: Secondary | ICD-10-CM | POA: Diagnosis not present

## 2015-11-22 DIAGNOSIS — J449 Chronic obstructive pulmonary disease, unspecified: Secondary | ICD-10-CM | POA: Diagnosis not present

## 2015-11-22 DIAGNOSIS — E1151 Type 2 diabetes mellitus with diabetic peripheral angiopathy without gangrene: Secondary | ICD-10-CM | POA: Diagnosis not present

## 2015-12-05 DIAGNOSIS — E875 Hyperkalemia: Secondary | ICD-10-CM | POA: Diagnosis not present

## 2015-12-11 ENCOUNTER — Ambulatory Visit (INDEPENDENT_AMBULATORY_CARE_PROVIDER_SITE_OTHER): Payer: Medicare Other | Admitting: Gastroenterology

## 2015-12-11 ENCOUNTER — Encounter: Payer: Self-pay | Admitting: Gastroenterology

## 2015-12-11 VITALS — BP 119/69 | HR 50 | Temp 97.0°F | Ht 64.0 in | Wt 263.0 lb

## 2015-12-11 DIAGNOSIS — K760 Fatty (change of) liver, not elsewhere classified: Secondary | ICD-10-CM | POA: Diagnosis not present

## 2015-12-11 DIAGNOSIS — K219 Gastro-esophageal reflux disease without esophagitis: Secondary | ICD-10-CM | POA: Diagnosis not present

## 2015-12-11 DIAGNOSIS — I2510 Atherosclerotic heart disease of native coronary artery without angina pectoris: Secondary | ICD-10-CM

## 2015-12-11 MED ORDER — PANTOPRAZOLE SODIUM 40 MG PO TBEC: 40.0000 mg | DELAYED_RELEASE_TABLET | Freq: Two times a day (BID) | ORAL | Status: DC

## 2015-12-11 NOTE — Progress Notes (Signed)
CC'ED TO PCP 

## 2015-12-11 NOTE — Assessment & Plan Note (Signed)
Follow-up upcoming labs next month. Discussed need for further weight loss, maintain tight glycemic control. Return to the office in six months.   Instructions for fatty liver: Recommend 1-2# weight loss per week until ideal body weight through exercise & diet. Low fat/cholesterol diet.   Avoid sweets, sodas, fruit juices, sweetened beverages like tea, etc. Gradually increase exercise from 15 min daily up to 1 hr per day 5 days/week. Limit alcohol use.

## 2015-12-11 NOTE — Progress Notes (Signed)
Primary Care Physician: Alonza Bogus, MD  Primary Gastroenterologist:  Barney Drain, MD   Chief Complaint  Patient presents with  . Follow-up    has alot of reflux and nausea    HPI: Ann [name] is a 70 y.o. female here For four-month follow-up. She was seen back in February with complaints of abdominal pain, GERD. She had a CT scan which showed fatty liver, small oval areas of fat necrosis in the anterior subcutaneous fat of the lower abdomen especially on the right from her subcutaneous injections. She had EGD in March showing gastritis, negative for H. pylori. Gastritis felt to be related to aspirin. She was advised to increase her omeprazole to twice daily before meals.  Patient continues to have frequent heartburn, regurgitation, nausea. Usually after meals. Some nocturnally.  No dysphagia. Occasional epigastric pain, not as frequent as before. Last a few minutes at a time. Bowel movements are regular. No blood in stool or melena. No further weight loss.   Current Outpatient Prescriptions  Medication Sig Dispense Refill  . albuterol (PROVENTIL HFA;VENTOLIN HFA) 108 (90 BASE) MCG/ACT inhaler Inhale 2 puffs into the lungs every 4 (four) hours as needed for wheezing.    Marland Kitchen ALPRAZolam (XANAX) 0.5 MG tablet Take 0.5 mg by mouth 2 (two) times daily.    Marland Kitchen amLODipine (NORVASC) 5 MG tablet Take 1 tablet (5 mg total) by mouth daily. 30 tablet 3  . atorvastatin (LIPITOR) 80 MG tablet Take 1 tablet (80 mg total) by mouth daily at 6 PM. 30 tablet 12  . baclofen (LIORESAL) 10 MG tablet Take 10 mg by mouth daily as needed for muscle spasms.    . carvedilol (COREG) 6.25 MG tablet Take 6.25 mg by mouth 2 (two) times daily with a meal.    . diltiazem (CARDIZEM) 120 MG tablet Take 120 mg by mouth daily.    Marland Kitchen docusate sodium (COLACE) 50 MG capsule Take 100 mg by mouth 2 (two) times daily.    . furosemide (LASIX) 40 MG tablet Take 40 mg by mouth daily.    Marland Kitchen gabapentin (NEURONTIN) 400 MG  capsule Take 400 mg by mouth 3 (three) times daily.    . hydrALAZINE (APRESOLINE) 50 MG tablet Take 1 tablet (50 mg total) by mouth 3 (three) times daily. 120 tablet 12  . Insulin Detemir (LEVEMIR FLEXTOUCH [place]) Inject 25 Units into the skin at bedtime.    Marland Kitchen levothyroxine (SYNTHROID, LEVOTHROID) 200 MCG tablet Take 200 mcg by mouth daily before breakfast. 150 mcg one tablet by mouth daily.    Marland Kitchen loratadine (CLARITIN) 10 MG tablet Take 10 mg by mouth daily.    . metFORMIN (GLUMETZA) 500 MG (MOD) 24 hr tablet Take 500 mg by mouth 2 (two) times daily with a meal.     . nitroGLYCERIN (NITROSTAT) 0.4 MG SL tablet Place 1 tablet (0.4 mg total) under the tongue every 5 (five) minutes as needed for chest pain.  12  . omeprazole (PRILOSEC) 20 MG capsule Take 1 capsule (20 mg total) by mouth 2 (two) times daily before a meal. 60 capsule 11  . oxybutynin (DITROPAN) 5 MG tablet Take 5 mg by mouth 2 (two) times daily.    . potassium chloride (K-DUR) 10 MEQ tablet Take 10 mEq by mouth 2 (two) times daily.    . predniSONE (DELTASONE) 10 MG tablet Take 10 mg by mouth daily with breakfast.    . sertraline (ZOLOFT) 100 MG tablet Take 0.5 tablets (50  mg total) by mouth at bedtime. 30 tablet 11  . solifenacin (VESICARE) 5 MG tablet Take 5 mg by mouth daily.    . vitamin B-12 1000 MCG tablet Take 1 tablet (1,000 mcg total) by mouth daily.    . Insulin Pen Needle (B-D ULTRAFINE III SHORT PEN) 31G X 8 MM MISC 1 each by Does not apply route as directed. 50 each 3  . Lancets MISC 1 each by Does not apply route as directed. 100 each 3   No current facility-administered medications for this visit.    Allergies as of 12/11/2015 - Review Complete 12/11/2015  Allergen Reaction Noted  . Ace inhibitors  10/14/2012  . Asa [aspirin]  12/11/2015  . Tape Other (See Comments) 09/10/2011  . Niacin Rash   . Reglan [metoclopramide] Anxiety 09/24/2014    ROS:  General: Negative for anorexia, weight loss, fever, chills, fatigue,  weakness. ENT: Negative for hoarseness, difficulty swallowing , nasal congestion. CV: Negative for chest pain, angina, palpitations, dyspnea on exertion, peripheral edema.  Respiratory: Negative for dyspnea at rest, dyspnea on exertion, cough, sputum, wheezing.  GI: See history of present illness. GU:  Negative for dysuria, hematuria, urinary incontinence, urinary frequency, nocturnal urination.  Endo: Negative for unusual weight change.    Physical Examination:   BP 119/69 mmHg  Pulse 50  Temp(Src) 97 F (36.1 C) (Oral)  Ht 5\' 4"  (1.626 m)  Wt 263 lb (119.296 kg)  BMI 45.12 kg/m2  General: Well-nourished, well-developed in no acute distress.  Eyes: No icterus. Mouth: Oropharyngeal mucosa moist and pink , no lesions erythema or exudate. Lungs: Clear to auscultation bilaterally.  Heart: Regular rate and rhythm, no murmurs rubs or gallops.  Abdomen: Bowel sounds are normal, nontender, nondistended, no hepatosplenomegaly or masses, no abdominal bruits or hernia , no rebound or guarding.   Extremities: No lower extremity edema. No clubbing or deformities. Neuro: Alert and oriented x 4   Skin: Warm and dry, no jaundice.   Psych: Alert and cooperative, normal mood and affect.

## 2015-12-11 NOTE — Assessment & Plan Note (Signed)
70 year old female history of gastritis on recent EGD who presents for follow-up. Has been on omeprazole twice a day. Continues to have frequent regurgitation, heartburn after meals and at bedtime. Significantly overweight. Abdominal pain has improved. Discussed antireflux measures. Trial of pantoprazole 40 mg twice a day before meals. Stop omeprazole. She will call if no significant improvement. Return in 6 months.

## 2015-12-11 NOTE — Patient Instructions (Signed)
Stop omeprazole.  Start pantoprazole 40 mg 30 minutes before breakfast and 30 minutes before your evening meal.  I will follow-up on upcoming liver labs and let you know if any further evaluation is needed based on results.  Return to the office in 6 months or call sooner if her reflux is not better controlled.  Instructions for fatty liver: Recommend 1-2# weight loss per week until ideal body weight through exercise & diet. Low fat/cholesterol diet.   Avoid sweets, sodas, fruit juices, sweetened beverages like tea, etc. Gradually increase exercise from 15 min daily up to 1 hr per day 5 days/week. Limit alcohol use.  Gastroesophageal Reflux Disease, Adult Normally, food travels down the esophagus and stays in the stomach to be digested. However, when a person has gastroesophageal reflux disease (GERD), food and stomach acid move back up into the esophagus. When this happens, the esophagus becomes sore and inflamed. Over time, GERD can create small holes (ulcers) in the lining of the esophagus.  CAUSES This condition is caused by a problem with the muscle between the esophagus and the stomach (lower esophageal sphincter, or LES). Normally, the LES muscle closes after food passes through the esophagus to the stomach. When the LES is weakened or abnormal, it does not close properly, and that allows food and stomach acid to go back up into the esophagus. The LES can be weakened by certain dietary substances, medicines, and medical conditions, including:  Tobacco use.  Pregnancy.  Having a hiatal hernia.  Heavy alcohol use.  Certain foods and beverages, such as coffee, chocolate, onions, and peppermint. RISK FACTORS This condition is more likely to develop in:  People who have an increased body weight.  People who have connective tissue disorders.  People who use NSAID medicines. SYMPTOMS Symptoms of this condition include:  Heartburn.  Difficult or painful swallowing.  The  feeling of having a lump in the throat.  Abitter taste in the mouth.  Bad breath.  Having a large amount of saliva.  Having an upset or bloated stomach.  Belching.  Chest pain.  Shortness of breath or wheezing.  Ongoing (chronic) cough or a night-time cough.  Wearing away of tooth enamel.  Weight loss. Different conditions can cause chest pain. Make sure to see your health care provider if you experience chest pain. DIAGNOSIS Your health care provider will take a medical history and perform a physical exam. To determine if you have mild or severe GERD, your health care provider may also monitor how you respond to treatment. You may also have other tests, including:  An endoscopy toexamine your stomach and esophagus with a small camera.  A test thatmeasures the acidity level in your esophagus.  A test thatmeasures how much pressure is on your esophagus.  A barium swallow or modified barium swallow to show the shape, size, and functioning of your esophagus. TREATMENT The goal of treatment is to help relieve your symptoms and to prevent complications. Treatment for this condition may vary depending on how severe your symptoms are. Your health care provider may recommend:  Changes to your diet.  Medicine.  Surgery. HOME CARE INSTRUCTIONS Diet  Follow a diet as recommended by your health care provider. This may involve avoiding foods and drinks such as:  Coffee and tea (with or without caffeine).  Drinks that containalcohol.  Energy drinks and sports drinks.  Carbonated drinks or sodas.  Chocolate and cocoa.  Peppermint and mint flavorings.  Garlic and onions.  Horseradish.  Spicy  and acidic foods, including peppers, chili powder, curry powder, vinegar, hot sauces, and barbecue sauce.  Citrus fruit juices and citrus fruits, such as oranges, lemons, and limes.  Tomato-based foods, such as red sauce, chili, salsa, and pizza with red sauce.  Fried and  fatty foods, such as donuts, french fries, potato chips, and high-fat dressings.  High-fat meats, such as hot dogs and fatty cuts of red and white meats, such as rib eye steak, sausage, ham, and bacon.  High-fat dairy items, such as whole milk, butter, and cream cheese.  Eat small, frequent meals instead of large meals.  Avoid drinking large amounts of liquid with your meals.  Avoid eating meals during the 2-3 hours before bedtime.  Avoid lying down right after you eat.  Do not exercise right after you eat. General Instructions  Pay attention to any changes in your symptoms.  Take over-the-counter and prescription medicines only as told by your health care provider. Do not take aspirin, ibuprofen, or other NSAIDs unless your health care provider told you to do so.  Do not use any tobacco products, including cigarettes, chewing tobacco, and e-cigarettes. If you need help quitting, ask your health care provider.  Wear loose-fitting clothing. Do not wear anything tight around your waist that causes pressure on your abdomen.  Raise (elevate) the head of your bed 6 inches (15cm).  Try to reduce your stress, such as with yoga or meditation. If you need help reducing stress, ask your health care provider.  If you are overweight, reduce your weight to an amount that is healthy for you. Ask your health care provider for guidance about a safe weight loss goal.  Keep all follow-up visits as told by your health care provider. This is important. SEEK MEDICAL CARE IF:  You have new symptoms.  You have unexplained weight loss.  You have difficulty swallowing, or it hurts to swallow.  You have wheezing or a persistent cough.  Your symptoms do not improve with treatment.  You have a hoarse voice. SEEK IMMEDIATE MEDICAL CARE IF:  You have pain in your arms, neck, jaw, teeth, or back.  You feel sweaty, dizzy, or light-headed.  You have chest pain or shortness of breath.  You vomit  and your vomit looks like blood or coffee grounds.  You faint.  Your stool is bloody or black.  You cannot swallow, drink, or eat.   This information is not intended to replace advice given to you by your health care provider. Make sure you discuss any questions you have with your health care provider.   Document Released: 02/20/2005 Document Revised: 02/01/2015 Document Reviewed: 09/07/2014 Elsevier Interactive Patient Education [employer].

## 2015-12-13 ENCOUNTER — Other Ambulatory Visit: Payer: Self-pay | Admitting: Gastroenterology

## 2015-12-13 MED ORDER — PANTOPRAZOLE SODIUM 40 MG PO TBEC: 40.0000 mg | DELAYED_RELEASE_TABLET | Freq: Two times a day (BID) | ORAL | Status: DC

## 2015-12-15 DIAGNOSIS — L851 Acquired keratosis [keratoderma] palmaris et plantaris: Secondary | ICD-10-CM | POA: Diagnosis not present

## 2015-12-15 DIAGNOSIS — E1142 Type 2 diabetes mellitus with diabetic polyneuropathy: Secondary | ICD-10-CM | POA: Diagnosis not present

## 2015-12-15 DIAGNOSIS — L603 Nail dystrophy: Secondary | ICD-10-CM | POA: Diagnosis not present

## 2016-01-30 DIAGNOSIS — E119 Type 2 diabetes mellitus without complications: Secondary | ICD-10-CM | POA: Diagnosis not present

## 2016-01-30 DIAGNOSIS — H26493 Other secondary cataract, bilateral: Secondary | ICD-10-CM | POA: Diagnosis not present

## 2016-01-30 DIAGNOSIS — Z961 Presence of intraocular lens: Secondary | ICD-10-CM | POA: Diagnosis not present

## 2016-02-03 LAB — HM DIABETES EYE EXAM

## 2016-02-06 ENCOUNTER — Encounter (HOSPITAL_COMMUNITY)
Admission: RE | Disposition: A | Discharge status: Home / Self Care | Payer: Self-pay | Source: Ambulatory Visit | Attending: Ophthalmology

## 2016-02-06 ENCOUNTER — Ambulatory Visit (HOSPITAL_COMMUNITY)
Admission: RE | Admit: 2016-02-06 | Discharge: 2016-02-06 | Disposition: A | Discharge status: Home / Self Care | Payer: Medicare Other | Source: Ambulatory Visit | Attending: Ophthalmology | Admitting: Ophthalmology

## 2016-02-06 DIAGNOSIS — H2640 Unspecified secondary cataract: Secondary | ICD-10-CM | POA: Diagnosis not present

## 2016-02-06 DIAGNOSIS — Z79899 Other long term (current) drug therapy: Secondary | ICD-10-CM | POA: Insufficient documentation

## 2016-02-06 DIAGNOSIS — I10 Essential (primary) hypertension: Secondary | ICD-10-CM | POA: Insufficient documentation

## 2016-02-06 DIAGNOSIS — E119 Type 2 diabetes mellitus without complications: Secondary | ICD-10-CM | POA: Insufficient documentation

## 2016-02-06 DIAGNOSIS — Z794 Long term (current) use of insulin: Secondary | ICD-10-CM | POA: Insufficient documentation

## 2016-02-06 DIAGNOSIS — H26491 Other secondary cataract, right eye: Secondary | ICD-10-CM | POA: Diagnosis not present

## 2016-02-06 DIAGNOSIS — J449 Chronic obstructive pulmonary disease, unspecified: Secondary | ICD-10-CM | POA: Diagnosis not present

## 2016-02-06 HISTORY — PX: YAG LASER APPLICATION: SHX6189

## 2016-02-06 SURGERY — TREATMENT, USING YAG LASER
Anesthesia: LOCAL | Laterality: Right

## 2016-02-06 MED ORDER — CYCLOPENTOLATE-PHENYLEPHRINE 0.2-1 % OP SOLN
1.0000 [drp] | OPHTHALMIC | Status: AC
  Administered 2016-02-06 (×3): 1 [drp] via OPHTHALMIC

## 2016-02-06 MED ORDER — CYCLOPENTOLATE HCL 1 % OP SOLN: 1.0000 [drp] | OPHTHALMIC | Status: DC

## 2016-02-06 MED ORDER — CYCLOPENTOLATE-PHENYLEPHRINE 0.2-1 % OP SOLN
OPHTHALMIC | Status: AC
Start: 2016-02-06 — End: 2016-02-06
  Filled 2016-02-06: qty 2

## 2016-02-06 NOTE — Discharge Instructions (Signed)
Ann [name]  02/06/2016     Instructions    Activity: No Restrictions.   Diet: Resume Diet you were on at home.   Pain Medication: Tylenol if Needed.   CONTACT YOUR DOCTOR IF YOU HAVE PAIN, REDNESS IN YOUR EYE, OR DECREASED VISION.              Today at  1:00 pm   with Rutherford Guys, MD.   Dr. Gershon Crane: [phone]      If you find that you cannot contact your physician, but feel that your signs and   Symptoms warrant a physician's attention, call the Emergency Room at   [phone] [phone].

## 2016-02-06 NOTE — Op Note (Signed)
Amoree Newlon T. Gershon Crane, MD  Procedure: Yag Capsulotomy  Yag Laser Self Test Completedyes. Procedure: Posterior Capsulotomy, Eye Protection Worn by Staff yes. Laser In Use Sign on Door yes.  Laser: Nd:YAG Spot Size: Fixed Burst Mode: III Power Setting: 3.4 mJ/burst Number of shots: 20 Total energy delivered: 63.50 mJ   The patient tolerated the procedure without difficulty. No complications were encountered.   The patient was discharged home with the instructions to continue all her current glaucoma medications, if any.   Patient instructed to go to office at 0100 for intraocular pressure check.  Patient verbalizes understanding of discharge instructions Yes.  .   Pre-Operative Diagnosis: After-Cataract, obscuring vision, 366.53 OD Post-Operative Diagnosis: After-Cataract, obscuring vision, 366.53 OD

## 2016-02-06 NOTE — H&P (Signed)
The patient was re examined and there is no change in the patients condition since the original H and P. 

## 2016-02-08 ENCOUNTER — Encounter: Payer: Self-pay | Admitting: "Endocrinology

## 2016-02-09 ENCOUNTER — Encounter (HOSPITAL_COMMUNITY): Payer: Self-pay | Admitting: Ophthalmology

## 2016-02-12 DIAGNOSIS — Z23 Encounter for immunization: Secondary | ICD-10-CM | POA: Diagnosis not present

## 2016-02-13 ENCOUNTER — Other Ambulatory Visit: Payer: Self-pay | Admitting: "Endocrinology

## 2016-02-13 DIAGNOSIS — E1159 Type 2 diabetes mellitus with other circulatory complications: Secondary | ICD-10-CM | POA: Diagnosis not present

## 2016-02-13 DIAGNOSIS — E1165 Type 2 diabetes mellitus with hyperglycemia: Secondary | ICD-10-CM | POA: Diagnosis not present

## 2016-02-13 DIAGNOSIS — Z794 Long term (current) use of insulin: Secondary | ICD-10-CM | POA: Diagnosis not present

## 2016-02-13 LAB — COMPLETE METABOLIC PANEL WITH GFR
ALBUMIN: 3.9 g/dL (ref 3.6–5.1)
ALT: 18 U/L (ref 6–29)
AST: 16 U/L (ref 10–35)
Alkaline Phosphatase: 114 U/L (ref 33–130)
BUN: 16 mg/dL (ref 7–25)
CHLORIDE: 103 mmol/L (ref 98–110)
CO2: 24 mmol/L (ref 20–31)
Calcium: 9.3 mg/dL (ref 8.6–10.4)
Creat: 0.89 mg/dL (ref 0.60–0.93)
GFR, Est African American: 76 mL/min (ref >=60)
GFR, Est Non African American: 66 mL/min (ref >=60)
GLUCOSE: 175 mg/dL — AB (ref 65–99)
POTASSIUM: 5.3 mmol/L (ref 3.5–5.3)
SODIUM: 138 mmol/L (ref 135–146)
Total Bilirubin: 0.3 mg/dL (ref 0.2–1.2)
Total Protein: 6.4 g/dL (ref 6.1–8.1)

## 2016-02-14 LAB — HEMOGLOBIN A1C
Hgb A1c MFr Bld: 7.6 % — ABNORMAL HIGH (ref <5.7)
Mean Plasma Glucose: 171 mg/dL

## 2016-02-20 ENCOUNTER — Encounter (HOSPITAL_COMMUNITY)
Admission: RE | Disposition: A | Discharge status: Home / Self Care | Payer: Self-pay | Source: Ambulatory Visit | Attending: Ophthalmology

## 2016-02-20 ENCOUNTER — Ambulatory Visit (HOSPITAL_COMMUNITY)
Admission: RE | Admit: 2016-02-20 | Discharge: 2016-02-20 | Disposition: A | Discharge status: Home / Self Care | Payer: Medicare Other | Source: Ambulatory Visit | Attending: Ophthalmology | Admitting: Ophthalmology

## 2016-02-20 ENCOUNTER — Encounter (HOSPITAL_COMMUNITY): Payer: Self-pay | Admitting: *Deleted

## 2016-02-20 DIAGNOSIS — E119 Type 2 diabetes mellitus without complications: Secondary | ICD-10-CM | POA: Diagnosis not present

## 2016-02-20 DIAGNOSIS — H2640 Unspecified secondary cataract: Secondary | ICD-10-CM | POA: Diagnosis not present

## 2016-02-20 DIAGNOSIS — Z794 Long term (current) use of insulin: Secondary | ICD-10-CM | POA: Insufficient documentation

## 2016-02-20 DIAGNOSIS — Z79899 Other long term (current) drug therapy: Secondary | ICD-10-CM | POA: Diagnosis not present

## 2016-02-20 DIAGNOSIS — J449 Chronic obstructive pulmonary disease, unspecified: Secondary | ICD-10-CM | POA: Diagnosis not present

## 2016-02-20 DIAGNOSIS — H26492 Other secondary cataract, left eye: Secondary | ICD-10-CM | POA: Diagnosis not present

## 2016-02-20 DIAGNOSIS — I10 Essential (primary) hypertension: Secondary | ICD-10-CM | POA: Insufficient documentation

## 2016-02-20 HISTORY — PX: YAG LASER APPLICATION: SHX6189

## 2016-02-20 SURGERY — TREATMENT, USING YAG LASER
Anesthesia: LOCAL | Laterality: Left

## 2016-02-20 MED ORDER — TROPICAMIDE 1 % OP SOLN
OPHTHALMIC | Status: AC
  Filled 2016-02-20: qty 3

## 2016-02-20 MED ORDER — TROPICAMIDE 1 % OP SOLN
2.0000 [drp] | OPHTHALMIC | Status: AC
  Administered 2016-02-20 (×2): 2 [drp] via OPHTHALMIC

## 2016-02-20 NOTE — Discharge Instructions (Signed)
Ann [name]  02/20/2016     Instructions    Activity: No Restrictions.   Diet: Resume Diet you were on at home.   Pain Medication: Tylenol if Needed.   CONTACT YOUR DOCTOR IF YOU HAVE PAIN, REDNESS IN YOUR EYE, OR DECREASED VISION.   Follow-up: with Rutherford Guys, MD today at 1:00pm   Dr. Gershon Crane: [phone]      If you find that you cannot contact your physician, but feel that your signs and   Symptoms warrant a physician's attention, call the Emergency Room at   [phone] [phone].

## 2016-02-20 NOTE — H&P (Signed)
The patient was re examined and there is no change in the patients condition since the original H and P. 

## 2016-02-20 NOTE — Op Note (Signed)
Ann Igo T. Gershon Crane, MD  Procedure: Yag Capsulotomy  Yag Laser Self Test Completedyes. Procedure: Posterior Capsulotomy, Eye Protection Worn by Staff yes. Laser In Use Sign on Door yes.  Laser: Nd:YAG Spot Size: Fixed Burst Mode: III Power Setting: 3.4 mJ/burst Number of shots: 22 Total energy delivered: 71.90 mJ   The patient tolerated the procedure without difficulty. No complications were encountered.   The patient was discharged home with the instructions to continue all her current glaucoma medications, if any.   Patient instructed to go to office at 0100 for intraocular pressure check.  Patient verbalizes understanding of discharge instructions Yes.  .    Pre-Operative Diagnosis: After-Cataract, obscuring vision, 366.53 OS Post-Operative Diagnosis: After-Cataract, obscuring vision, 366.53 OS Date of Cataract Surgery: 03/19/2011

## 2016-02-22 ENCOUNTER — Encounter: Payer: Self-pay | Admitting: "Endocrinology

## 2016-02-22 ENCOUNTER — Ambulatory Visit (INDEPENDENT_AMBULATORY_CARE_PROVIDER_SITE_OTHER): Payer: Medicare Other | Admitting: "Endocrinology

## 2016-02-22 VITALS — BP 119/70 | HR 62 | Ht 64.0 in | Wt 268.0 lb

## 2016-02-22 DIAGNOSIS — E039 Hypothyroidism, unspecified: Secondary | ICD-10-CM | POA: Diagnosis not present

## 2016-02-22 DIAGNOSIS — E1159 Type 2 diabetes mellitus with other circulatory complications: Secondary | ICD-10-CM

## 2016-02-22 DIAGNOSIS — E1165 Type 2 diabetes mellitus with hyperglycemia: Secondary | ICD-10-CM

## 2016-02-22 DIAGNOSIS — E2740 Unspecified adrenocortical insufficiency: Secondary | ICD-10-CM

## 2016-02-22 DIAGNOSIS — I10 Essential (primary) hypertension: Secondary | ICD-10-CM

## 2016-02-22 DIAGNOSIS — Z794 Long term (current) use of insulin: Secondary | ICD-10-CM

## 2016-02-22 DIAGNOSIS — I2510 Atherosclerotic heart disease of native coronary artery without angina pectoris: Secondary | ICD-10-CM

## 2016-02-22 DIAGNOSIS — IMO0002 Reserved for concepts with insufficient information to code with codable children: Secondary | ICD-10-CM

## 2016-02-22 NOTE — Patient Instructions (Signed)
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
## 2016-02-26 ENCOUNTER — Encounter (HOSPITAL_COMMUNITY): Payer: Self-pay | Admitting: Ophthalmology

## 2016-02-28 DIAGNOSIS — R32 Unspecified urinary incontinence: Secondary | ICD-10-CM | POA: Diagnosis not present

## 2016-02-28 DIAGNOSIS — J449 Chronic obstructive pulmonary disease, unspecified: Secondary | ICD-10-CM | POA: Diagnosis not present

## 2016-02-28 DIAGNOSIS — Z23 Encounter for immunization: Secondary | ICD-10-CM | POA: Diagnosis not present

## 2016-02-28 DIAGNOSIS — I2510 Atherosclerotic heart disease of native coronary artery without angina pectoris: Secondary | ICD-10-CM | POA: Diagnosis not present

## 2016-02-28 DIAGNOSIS — E1151 Type 2 diabetes mellitus with diabetic peripheral angiopathy without gangrene: Secondary | ICD-10-CM | POA: Diagnosis not present

## 2016-03-01 DIAGNOSIS — E1142 Type 2 diabetes mellitus with diabetic polyneuropathy: Secondary | ICD-10-CM | POA: Diagnosis not present

## 2016-03-01 DIAGNOSIS — L603 Nail dystrophy: Secondary | ICD-10-CM | POA: Diagnosis not present

## 2016-03-01 DIAGNOSIS — L851 Acquired keratosis [keratoderma] palmaris et plantaris: Secondary | ICD-10-CM | POA: Diagnosis not present

## 2016-03-08 DIAGNOSIS — I10 Essential (primary) hypertension: Secondary | ICD-10-CM | POA: Diagnosis not present

## 2016-03-08 DIAGNOSIS — E039 Hypothyroidism, unspecified: Secondary | ICD-10-CM | POA: Diagnosis not present

## 2016-03-08 DIAGNOSIS — R531 Weakness: Secondary | ICD-10-CM | POA: Diagnosis not present

## 2016-03-08 DIAGNOSIS — J449 Chronic obstructive pulmonary disease, unspecified: Secondary | ICD-10-CM | POA: Diagnosis not present

## 2016-03-08 DIAGNOSIS — N390 Urinary tract infection, site not specified: Secondary | ICD-10-CM | POA: Diagnosis not present

## 2016-03-09 LAB — TSH: TSH: 0.62 (ref <=5.90)

## 2016-03-09 LAB — BASIC METABOLIC PANEL
Glucose: 161
Potassium: 5.4 — AB (ref 3.4–5.3)

## 2016-03-29 DIAGNOSIS — H16401 Unspecified corneal neovascularization, right eye: Secondary | ICD-10-CM | POA: Diagnosis not present

## 2016-03-29 DIAGNOSIS — H35363 Drusen (degenerative) of macula, bilateral: Secondary | ICD-10-CM | POA: Diagnosis not present

## 2016-03-29 DIAGNOSIS — H179 Unspecified corneal scar and opacity: Secondary | ICD-10-CM | POA: Diagnosis not present

## 2016-03-29 DIAGNOSIS — D3131 Benign neoplasm of right choroid: Secondary | ICD-10-CM | POA: Diagnosis not present

## 2016-05-07 ENCOUNTER — Encounter: Payer: Self-pay | Admitting: Gastroenterology

## 2016-05-08 DIAGNOSIS — H539 Unspecified visual disturbance: Secondary | ICD-10-CM | POA: Diagnosis not present

## 2016-05-08 DIAGNOSIS — E1140 Type 2 diabetes mellitus with diabetic neuropathy, unspecified: Secondary | ICD-10-CM | POA: Diagnosis not present

## 2016-05-08 DIAGNOSIS — J441 Chronic obstructive pulmonary disease with (acute) exacerbation: Secondary | ICD-10-CM | POA: Diagnosis not present

## 2016-05-08 DIAGNOSIS — L989 Disorder of the skin and subcutaneous tissue, unspecified: Secondary | ICD-10-CM | POA: Diagnosis not present

## 2016-05-15 DIAGNOSIS — L820 Inflamed seborrheic keratosis: Secondary | ICD-10-CM | POA: Diagnosis not present

## 2016-05-15 DIAGNOSIS — B078 Other viral warts: Secondary | ICD-10-CM | POA: Diagnosis not present

## 2016-05-15 DIAGNOSIS — D485 Neoplasm of uncertain behavior of skin: Secondary | ICD-10-CM | POA: Diagnosis not present

## 2016-05-17 ENCOUNTER — Other Ambulatory Visit: Payer: Self-pay | Admitting: "Endocrinology

## 2016-05-17 DIAGNOSIS — E039 Hypothyroidism, unspecified: Secondary | ICD-10-CM | POA: Diagnosis not present

## 2016-05-17 DIAGNOSIS — E1142 Type 2 diabetes mellitus with diabetic polyneuropathy: Secondary | ICD-10-CM | POA: Diagnosis not present

## 2016-05-17 DIAGNOSIS — E1165 Type 2 diabetes mellitus with hyperglycemia: Secondary | ICD-10-CM | POA: Diagnosis not present

## 2016-05-17 DIAGNOSIS — L603 Nail dystrophy: Secondary | ICD-10-CM | POA: Diagnosis not present

## 2016-05-17 DIAGNOSIS — E1159 Type 2 diabetes mellitus with other circulatory complications: Secondary | ICD-10-CM | POA: Diagnosis not present

## 2016-05-17 DIAGNOSIS — Z794 Long term (current) use of insulin: Secondary | ICD-10-CM | POA: Diagnosis not present

## 2016-05-17 DIAGNOSIS — L851 Acquired keratosis [keratoderma] palmaris et plantaris: Secondary | ICD-10-CM | POA: Diagnosis not present

## 2016-05-17 LAB — LIPID PANEL
CHOL/HDL RATIO: 4.4 ratio (ref <5.0)
Cholesterol: 109 mg/dL (ref <200)
HDL: 25 mg/dL — ABNORMAL LOW (ref >50)
LDL CALC: 48 mg/dL (ref <100)
Triglycerides: 178 mg/dL — ABNORMAL HIGH (ref <150)
VLDL: 36 mg/dL — ABNORMAL HIGH (ref <30)

## 2016-05-17 LAB — COMPREHENSIVE METABOLIC PANEL
ALK PHOS: 117 U/L (ref 33–130)
ALT: 29 U/L (ref 6–29)
AST: 24 U/L (ref 10–35)
Albumin: 3.3 g/dL — ABNORMAL LOW (ref 3.6–5.1)
BUN: 16 mg/dL (ref 7–25)
CO2: 25 mmol/L (ref 20–31)
Calcium: 8.5 mg/dL — ABNORMAL LOW (ref 8.6–10.4)
Chloride: 105 mmol/L (ref 98–110)
Creat: 0.79 mg/dL (ref 0.60–0.93)
GLUCOSE: 168 mg/dL — AB (ref 65–99)
POTASSIUM: 5.4 mmol/L — AB (ref 3.5–5.3)
Sodium: 139 mmol/L (ref 135–146)
Total Bilirubin: 0.3 mg/dL (ref 0.2–1.2)
Total Protein: 6.2 g/dL (ref 6.1–8.1)

## 2016-05-17 LAB — TSH: TSH: 0.56 m[IU]/L

## 2016-05-17 LAB — HEMOGLOBIN A1C
Hgb A1c MFr Bld: 8.1 % — ABNORMAL HIGH (ref <5.7)
Mean Plasma Glucose: 186 mg/dL

## 2016-05-17 LAB — T4, FREE: FREE T4: 1.6 ng/dL (ref 0.8–1.8)

## 2016-05-18 LAB — MICROALBUMIN / CREATININE URINE RATIO
CREATININE, URINE: 121 mg/dL (ref 20–320)
MICROALB UR: 0.5 mg/dL
MICROALB/CREAT RATIO: 4 ug/mg{creat} (ref <30)

## 2016-05-22 DIAGNOSIS — J029 Acute pharyngitis, unspecified: Secondary | ICD-10-CM | POA: Diagnosis not present

## 2016-05-22 DIAGNOSIS — J441 Chronic obstructive pulmonary disease with (acute) exacerbation: Secondary | ICD-10-CM | POA: Diagnosis not present

## 2016-05-29 ENCOUNTER — Ambulatory Visit (INDEPENDENT_AMBULATORY_CARE_PROVIDER_SITE_OTHER): Payer: Medicare Other | Admitting: "Endocrinology

## 2016-05-29 ENCOUNTER — Encounter: Payer: Self-pay | Admitting: "Endocrinology

## 2016-05-29 VITALS — BP 162/65 | HR 78 | Ht 64.0 in | Wt 269.0 lb

## 2016-05-29 DIAGNOSIS — I10 Essential (primary) hypertension: Secondary | ICD-10-CM | POA: Diagnosis not present

## 2016-05-29 DIAGNOSIS — E1165 Type 2 diabetes mellitus with hyperglycemia: Secondary | ICD-10-CM | POA: Diagnosis not present

## 2016-05-29 DIAGNOSIS — E782 Mixed hyperlipidemia: Secondary | ICD-10-CM | POA: Diagnosis not present

## 2016-05-29 DIAGNOSIS — E1159 Type 2 diabetes mellitus with other circulatory complications: Secondary | ICD-10-CM | POA: Diagnosis not present

## 2016-05-29 DIAGNOSIS — E039 Hypothyroidism, unspecified: Secondary | ICD-10-CM

## 2016-05-29 DIAGNOSIS — Z794 Long term (current) use of insulin: Secondary | ICD-10-CM

## 2016-05-29 DIAGNOSIS — E2740 Unspecified adrenocortical insufficiency: Secondary | ICD-10-CM

## 2016-05-29 DIAGNOSIS — IMO0002 Reserved for concepts with insufficient information to code with codable children: Secondary | ICD-10-CM

## 2016-05-29 MED ORDER — INSULIN DETEMIR 100 UNIT/ML FLEXPEN: 30.0000 [IU] | PEN_INJECTOR | Freq: Every day | SUBCUTANEOUS | 11 refills | Status: DC

## 2016-05-29 MED ORDER — INSULIN PEN NEEDLE 31G X 8 MM MISC: 1 refills | Status: DC

## 2016-05-29 MED ORDER — LANCETS MISC: 3 refills | Status: DC

## 2016-05-29 MED ORDER — METFORMIN HCL ER (MOD) 500 MG PO TB24: 500.0000 mg | ORAL_TABLET | Freq: Two times a day (BID) | ORAL | 1 refills | Status: DC
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
## 2016-05-29 NOTE — Patient Instructions (Signed)

## 2016-05-30 DIAGNOSIS — J449 Chronic obstructive pulmonary disease, unspecified: Secondary | ICD-10-CM | POA: Diagnosis not present

## 2016-05-30 DIAGNOSIS — I10 Essential (primary) hypertension: Secondary | ICD-10-CM | POA: Diagnosis not present

## 2016-05-30 DIAGNOSIS — M545 Low back pain: Secondary | ICD-10-CM | POA: Diagnosis not present

## 2016-05-30 DIAGNOSIS — E1151 Type 2 diabetes mellitus with diabetic peripheral angiopathy without gangrene: Secondary | ICD-10-CM | POA: Diagnosis not present

## 2016-06-07 DIAGNOSIS — H26492 Other secondary cataract, left eye: Secondary | ICD-10-CM | POA: Diagnosis not present

## 2016-06-13 ENCOUNTER — Ambulatory Visit: Payer: Medicare Other | Admitting: Gastroenterology

## 2016-06-27 ENCOUNTER — Encounter: Payer: Self-pay | Admitting: Gastroenterology

## 2016-06-27 ENCOUNTER — Ambulatory Visit (INDEPENDENT_AMBULATORY_CARE_PROVIDER_SITE_OTHER): Payer: Medicare Other | Admitting: Gastroenterology

## 2016-06-27 DIAGNOSIS — R1903 Right lower quadrant abdominal swelling, mass and lump: Secondary | ICD-10-CM

## 2016-06-27 DIAGNOSIS — K219 Gastro-esophageal reflux disease without esophagitis: Secondary | ICD-10-CM

## 2016-06-27 MED ORDER — PANTOPRAZOLE SODIUM 40 MG PO TBEC: DELAYED_RELEASE_TABLET | ORAL | 11 refills | Status: DC

## 2016-06-27 MED ORDER — LINACLOTIDE 72 MCG PO CAPS: 72.0000 ug | ORAL_CAPSULE | Freq: Every day | ORAL | 11 refills | Status: DC

## 2016-06-27 NOTE — Progress Notes (Signed)
On recall  °

## 2016-06-27 NOTE — Progress Notes (Signed)
cc'ed to pcp °

## 2016-06-27 NOTE — Assessment & Plan Note (Signed)
GAINED 10 LBS SINCE JUL 2017 AND NOW BMI > 45. WEIGHT GAIN IS EXACERBATING REFLUX SYMPTOMS.  CONTINUE YOUR WEIGHT LOSS EFFORTS. DRINK WATER. FOLLOW UP IN 3 MOS.

## 2016-06-27 NOTE — Patient Instructions (Addendum)
CONTINUE YOUR WEIGHT LOSS EFFORTS.  DRINK WATER TO KEEP YOUR URINE LIGHT YELLOW.  AVOID FOOD AND DRINKS THAT TRIGGERS REFLUX. SEE INFO BELOW.   ADD PROTONIX. TAKE 30 MINUTES PRIOR TO MEALS TWICE DAILY TO CONTROL HEARTBURN.  ADD LINZESS 30 MINS PRIOR TO BREAKFAST EVERY OTHER DAY. IT MAY CAUSE EXPLOSIVE DIARRHEA.  USE METAMUCIL IF NEEDED TO PREVENT CONSTIPATION.  PLEASE CALL WITH QUESTIONS OR CONCERNS.  FOLLOW UP IN 3 MOS.          Lifestyle and home remedies TO MANAGE REFLUX/HEARTBURN  You may eliminate or reduce the frequency of heartburn by making the following lifestyle changes:  . Control your weight. Being overweight is a major risk factor for heartburn and GERD. Excess pounds put pressure on your abdomen, pushing up your stomach and causing acid to back up into your esophagus.   . Eat smaller meals. 4 TO 6 MEALS A DAY. This reduces pressure on the lower esophageal sphincter, helping to prevent the valve from opening and acid from washing back into your esophagus.   Dolphus Jenny your belt. Clothes that fit tightly around your waist put pressure on your abdomen and the lower esophageal sphincter.   . Eliminate heartburn triggers. Everyone has specific triggers. Common triggers such as fatty or fried foods, spicy food, tomato sauce, carbonated beverages, alcohol, chocolate, mint, garlic, onion, caffeine and nicotine may make heartburn worse.   Marland Kitchen Avoid stooping or bending. Tying your shoes is OK. Bending over for longer periods to weed your garden isn't, especially soon after eating.   . Don't lie down after a meal. Wait at least three to four hours after eating before going to bed, and don't lie down right after eating.   Marland Kitchen PUT THE HEAD OF YOUR BED ON 6 INCH BLOCKS.   Alternative medicine . Several home remedies exist for treating GERD, but they provide only temporary relief. They include drinking baking soda (sodium bicarbonate) added to water or drinking other fluids such  as baking soda mixed with cream of tartar and water.  . Although these liquids create temporary relief by neutralizing, washing away or buffering acids, eventually they aggravate the situation by adding gas and fluid to your stomach, increasing pressure and causing more acid reflux. Further, adding more sodium to your diet may increase your blood pressure and add stress to your heart, and excessive bicarbonate ingestion can alter the acid-base balance in your body.
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
## 2016-06-27 NOTE — Assessment & Plan Note (Addendum)
DUE TO CONSTIPATION.  CONTINUE YOUR WEIGHT LOSS EFFORTS.  ADD LINZESS 30 MINS PRIOR TO BREAKFAST EVERY OTHER DAY. IT MAY CAUSE EXPLOSIVE DIARRHEA. USE METAMUCIL IF NEEDED TO PREVENT CONSTIPATION. PLEASE CALL WITH QUESTIONS OR CONCERNS.  FOLLOW UP IN 3 MOS.

## 2016-06-27 NOTE — Assessment & Plan Note (Addendum)
SYMPTOMS NOT CONTROLLED DUE TO LIFESTYLE CHOICES AND NO MEDS.  CONTINUE YOUR WEIGHT LOSS EFFORTS. DRINK WATER TO KEEP YOUR URINE LIGHT YELLOW. AVOID FOOD AND DRINKS THAT TRIGGERS REFLUX.  HANDOUT GIVEN.  ADD PROTONIX. TAKE 30 MINUTES PRIOR TO MEALS TWICE DAILY TO CONTROL HEARTBURN. FOLLOW UP IN 3 MOS.

## 2016-07-11 ENCOUNTER — Ambulatory Visit (HOSPITAL_COMMUNITY)
Admission: RE | Admit: 2016-07-11 | Discharge: 2016-07-11 | Disposition: A | Discharge status: Home / Self Care | Payer: Medicare Other | Source: Ambulatory Visit | Attending: Pulmonary Disease | Admitting: Pulmonary Disease

## 2016-07-11 ENCOUNTER — Other Ambulatory Visit (HOSPITAL_COMMUNITY): Payer: Self-pay | Admitting: Pulmonary Disease

## 2016-07-11 DIAGNOSIS — E279 Disorder of adrenal gland, unspecified: Secondary | ICD-10-CM | POA: Diagnosis not present

## 2016-07-11 DIAGNOSIS — I10 Essential (primary) hypertension: Secondary | ICD-10-CM | POA: Diagnosis not present

## 2016-07-11 DIAGNOSIS — M79642 Pain in left hand: Secondary | ICD-10-CM | POA: Diagnosis not present

## 2016-07-11 DIAGNOSIS — M25532 Pain in left wrist: Secondary | ICD-10-CM | POA: Insufficient documentation

## 2016-07-11 DIAGNOSIS — J449 Chronic obstructive pulmonary disease, unspecified: Secondary | ICD-10-CM | POA: Diagnosis not present

## 2016-08-02 DIAGNOSIS — L851 Acquired keratosis [keratoderma] palmaris et plantaris: Secondary | ICD-10-CM | POA: Diagnosis not present

## 2016-08-02 DIAGNOSIS — E1142 Type 2 diabetes mellitus with diabetic polyneuropathy: Secondary | ICD-10-CM | POA: Diagnosis not present

## 2016-08-02 DIAGNOSIS — L603 Nail dystrophy: Secondary | ICD-10-CM | POA: Diagnosis not present

## 2016-08-27 ENCOUNTER — Encounter: Payer: Self-pay | Admitting: Gastroenterology

## 2016-09-04 ENCOUNTER — Other Ambulatory Visit: Payer: Self-pay | Admitting: "Endocrinology

## 2016-09-04 DIAGNOSIS — E1159 Type 2 diabetes mellitus with other circulatory complications: Secondary | ICD-10-CM | POA: Diagnosis not present

## 2016-09-04 DIAGNOSIS — E039 Hypothyroidism, unspecified: Secondary | ICD-10-CM | POA: Diagnosis not present

## 2016-09-04 DIAGNOSIS — Z794 Long term (current) use of insulin: Secondary | ICD-10-CM | POA: Diagnosis not present

## 2016-09-04 DIAGNOSIS — E1165 Type 2 diabetes mellitus with hyperglycemia: Secondary | ICD-10-CM | POA: Diagnosis not present

## 2016-09-04 LAB — T4, FREE: FREE T4: 1.4 ng/dL (ref 0.8–1.8)

## 2016-09-04 LAB — COMPREHENSIVE METABOLIC PANEL
ALT: 20 U/L (ref 6–29)
AST: 26 U/L (ref 10–35)
Albumin: 3.8 g/dL (ref 3.6–5.1)
Alkaline Phosphatase: 125 U/L (ref 33–130)
BUN: 16 mg/dL (ref 7–25)
CHLORIDE: 102 mmol/L (ref 98–110)
CO2: 24 mmol/L (ref 20–31)
Calcium: 9.4 mg/dL (ref 8.6–10.4)
Creat: 0.83 mg/dL (ref 0.60–0.93)
GLUCOSE: 173 mg/dL — AB (ref 65–99)
POTASSIUM: 4.5 mmol/L (ref 3.5–5.3)
Sodium: 140 mmol/L (ref 135–146)
Total Bilirubin: 0.4 mg/dL (ref 0.2–1.2)
Total Protein: 6.6 g/dL (ref 6.1–8.1)

## 2016-09-04 LAB — TSH: TSH: 0.92 m[IU]/L

## 2016-09-05 LAB — HEMOGLOBIN A1C
HEMOGLOBIN A1C: 7.9 % — AB (ref <5.7)
Mean Plasma Glucose: 180 mg/dL

## 2016-09-10 DIAGNOSIS — M25532 Pain in left wrist: Secondary | ICD-10-CM | POA: Diagnosis not present

## 2016-09-10 DIAGNOSIS — E1140 Type 2 diabetes mellitus with diabetic neuropathy, unspecified: Secondary | ICD-10-CM | POA: Diagnosis not present

## 2016-09-10 DIAGNOSIS — I2510 Atherosclerotic heart disease of native coronary artery without angina pectoris: Secondary | ICD-10-CM | POA: Diagnosis not present

## 2016-09-10 DIAGNOSIS — I10 Essential (primary) hypertension: Secondary | ICD-10-CM | POA: Diagnosis not present

## 2016-09-11 ENCOUNTER — Ambulatory Visit (INDEPENDENT_AMBULATORY_CARE_PROVIDER_SITE_OTHER): Payer: Medicare Other | Admitting: "Endocrinology

## 2016-09-11 ENCOUNTER — Encounter: Payer: Self-pay | Admitting: "Endocrinology

## 2016-09-11 VITALS — BP 124/72 | HR 79 | Ht 64.0 in | Wt 276.0 lb

## 2016-09-11 DIAGNOSIS — E782 Mixed hyperlipidemia: Secondary | ICD-10-CM | POA: Diagnosis not present

## 2016-09-11 DIAGNOSIS — E039 Hypothyroidism, unspecified: Secondary | ICD-10-CM | POA: Diagnosis not present

## 2016-09-11 DIAGNOSIS — I10 Essential (primary) hypertension: Secondary | ICD-10-CM

## 2016-09-11 DIAGNOSIS — E1165 Type 2 diabetes mellitus with hyperglycemia: Secondary | ICD-10-CM

## 2016-09-11 DIAGNOSIS — E1159 Type 2 diabetes mellitus with other circulatory complications: Secondary | ICD-10-CM

## 2016-09-11 DIAGNOSIS — Z794 Long term (current) use of insulin: Secondary | ICD-10-CM

## 2016-09-11 DIAGNOSIS — E2740 Unspecified adrenocortical insufficiency: Secondary | ICD-10-CM

## 2016-09-11 DIAGNOSIS — IMO0002 Reserved for concepts with insufficient information to code with codable children: Secondary | ICD-10-CM

## 2016-09-11 MED ORDER — INSULIN DETEMIR 100 UNIT/ML FLEXPEN: 40.0000 [IU] | PEN_INJECTOR | Freq: Every day | SUBCUTANEOUS | 2 refills | Status: DC

## 2016-09-11 NOTE — Patient Instructions (Signed)
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
## 2016-09-26 ENCOUNTER — Ambulatory Visit (HOSPITAL_COMMUNITY): Payer: Medicare Other | Attending: Pulmonary Disease | Admitting: Speech Pathology

## 2016-09-26 ENCOUNTER — Encounter (HOSPITAL_COMMUNITY): Payer: Self-pay | Admitting: Speech Pathology

## 2016-09-26 DIAGNOSIS — R1312 Dysphagia, oropharyngeal phase: Secondary | ICD-10-CM | POA: Diagnosis not present
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
## 2016-09-30 ENCOUNTER — Telehealth: Payer: Self-pay

## 2016-09-30 NOTE — Telephone Encounter (Signed)
Advise to increase Levemir to 50 units qhs.

## 2016-09-30 NOTE — Telephone Encounter (Signed)
Left message for pt to call back  °

## 2016-09-30 NOTE — Telephone Encounter (Signed)
Pt states she has had high BG readings.   Date Before breakfast Before lunch Before supper Bedtime  5-5 190   245  5-6 175   231  5-7 203             Pt taking: MTF 500mg  bid, Levemir 40 units qhs

## 2016-09-30 NOTE — Telephone Encounter (Signed)
Pt.notified

## 2016-10-11 DIAGNOSIS — L851 Acquired keratosis [keratoderma] palmaris et plantaris: Secondary | ICD-10-CM | POA: Diagnosis not present

## 2016-10-11 DIAGNOSIS — L603 Nail dystrophy: Secondary | ICD-10-CM | POA: Diagnosis not present

## 2016-10-11 DIAGNOSIS — E1142 Type 2 diabetes mellitus with diabetic polyneuropathy: Secondary | ICD-10-CM | POA: Diagnosis not present

## 2016-10-23 ENCOUNTER — Ambulatory Visit (INDEPENDENT_AMBULATORY_CARE_PROVIDER_SITE_OTHER): Payer: Medicare Other | Admitting: Gastroenterology

## 2016-10-23 ENCOUNTER — Encounter: Payer: Self-pay | Admitting: Gastroenterology

## 2016-10-23 ENCOUNTER — Other Ambulatory Visit: Payer: Self-pay

## 2016-10-23 DIAGNOSIS — R112 Nausea with vomiting, unspecified: Secondary | ICD-10-CM | POA: Diagnosis not present

## 2016-10-23 DIAGNOSIS — K219 Gastro-esophageal reflux disease without esophagitis: Secondary | ICD-10-CM | POA: Diagnosis not present

## 2016-10-23 DIAGNOSIS — K5901 Slow transit constipation: Secondary | ICD-10-CM | POA: Insufficient documentation

## 2016-10-23 DIAGNOSIS — K760 Fatty (change of) liver, not elsewhere classified: Secondary | ICD-10-CM | POA: Diagnosis not present

## 2016-10-23 NOTE — Assessment & Plan Note (Signed)
PT WEIGHT DOWN 3 LBS SINCE FEB 2018. BMI REMAINS > 40. WILLING TO TRY A VEGETARIAN/DIOABTEIC DIET.  NUTRITION CONSULT FOR VEGETARIAN/DIABETIC DIET. GOAL WEIGHT LOSS 5-10 LBS A MONTH. FOLLOW UP IN 6 MOS.
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
## 2016-10-23 NOTE — Assessment & Plan Note (Signed)
SYMPTOMS FAIRLY WELL CONTROLLED OR PROTONIX BID.  CONTINUE PROTONIX. TAKE 30 MINUTES PRIOR TO MEALS TWICE DAILY. AVOID REFLUX TRIGGERS LOSE WEIGHT FOLLOW UP IN 6 MOS.

## 2016-10-23 NOTE — Assessment & Plan Note (Signed)
WEIGHT DOWN 3 LBS SINCE FEB 2018.  NUTRITION CONSULT FOR WEIGHT LOSS, VEGETARIAN/DIABTEIC DIET. FOLLOW UP IN 6 MOS.

## 2016-10-23 NOTE — Assessment & Plan Note (Signed)
SYMPTOMS CONTROLLED/RESOLVED.  CONTINUE TO MONITOR SYMPTOMS. FOLLOW UP IN 6 MOS.  

## 2016-10-23 NOTE — Patient Instructions (Signed)
CONTINUE YOUR WEIGHT LOSS EFFORTS. LOSE 20 LBS.  SEE NUTRITION FOR A VEGETARIAN/DIABETIC DIET INSTRUCTION.  DRINK WATER TO KEEP YOUR URINE LIGHT YELLOW.  CONTINUE PROTONIX. TAKE 30 MINUTES PRIOR TO MEALS TWICE DAILY.  CONTINUE LINZESS.  FOLLOW UP IN 6 MOS.

## 2016-10-23 NOTE — Progress Notes (Signed)
cc'ed to pcp °

## 2016-10-23 NOTE — Progress Notes (Signed)
ON RECALL  °

## 2016-10-23 NOTE — Assessment & Plan Note (Signed)
SYMPTOMS CONTROLLED/RESOLVED AFTER LINZESS.  [place] WATER EAT FIBER CONTINUE LINZESS. FOLLOW UP IN 6 MOS.

## 2016-11-06 DIAGNOSIS — E119 Type 2 diabetes mellitus without complications: Secondary | ICD-10-CM | POA: Diagnosis not present

## 2016-11-06 DIAGNOSIS — Z961 Presence of intraocular lens: Secondary | ICD-10-CM | POA: Diagnosis not present

## 2016-11-06 DIAGNOSIS — H179 Unspecified corneal scar and opacity: Secondary | ICD-10-CM | POA: Diagnosis not present

## 2016-11-06 DIAGNOSIS — H16401 Unspecified corneal neovascularization, right eye: Secondary | ICD-10-CM | POA: Diagnosis not present

## 2016-11-11 DIAGNOSIS — N3281 Overactive bladder: Secondary | ICD-10-CM | POA: Diagnosis not present

## 2016-11-11 DIAGNOSIS — E1151 Type 2 diabetes mellitus with diabetic peripheral angiopathy without gangrene: Secondary | ICD-10-CM | POA: Diagnosis not present

## 2016-11-11 DIAGNOSIS — J449 Chronic obstructive pulmonary disease, unspecified: Secondary | ICD-10-CM | POA: Diagnosis not present

## 2016-11-11 DIAGNOSIS — I110 Hypertensive heart disease with heart failure: Secondary | ICD-10-CM | POA: Diagnosis not present

## 2016-11-12 ENCOUNTER — Other Ambulatory Visit (HOSPITAL_COMMUNITY): Payer: Self-pay | Admitting: Respiratory Therapy

## 2016-11-12 DIAGNOSIS — J441 Chronic obstructive pulmonary disease with (acute) exacerbation: Secondary | ICD-10-CM

## 2016-12-03 ENCOUNTER — Encounter: Payer: Medicare Other | Attending: Pulmonary Disease | Admitting: Nutrition

## 2016-12-03 VITALS — Ht 64.0 in | Wt 274.0 lb

## 2016-12-03 DIAGNOSIS — Z794 Long term (current) use of insulin: Secondary | ICD-10-CM

## 2016-12-03 DIAGNOSIS — E1165 Type 2 diabetes mellitus with hyperglycemia: Secondary | ICD-10-CM | POA: Insufficient documentation

## 2016-12-03 DIAGNOSIS — Z6841 Body Mass Index (BMI) 40.0 and over, adult: Secondary | ICD-10-CM | POA: Insufficient documentation

## 2016-12-03 DIAGNOSIS — E118 Type 2 diabetes mellitus with unspecified complications: Secondary | ICD-10-CM

## 2016-12-03 DIAGNOSIS — Z713 Dietary counseling and surveillance: Secondary | ICD-10-CM | POA: Insufficient documentation

## 2016-12-03 DIAGNOSIS — IMO0002 Reserved for concepts with insufficient information to code with codable children: Secondary | ICD-10-CM

## 2016-12-03 NOTE — Progress Notes (Signed)
Diabetes Self-Management Education  Visit Type: First/Initial  Appt. Start Time: 1330 Appt. End Time: 1500  12/03/2016  [name], identified by name and date of birth, is a 71 y.o. female with a diagnosis of Diabetes: Type 2. Here with her husband. Wants to lose weight and get her diabetes under control.  She notes she is SOB when walking. Limited mobility. Walks with a walker.Is motivated to follow a vegetarian diabetic diet. Admits to eating too many biscuits.Eats 2 meals a day mostly. Likes vegetables. Working on avoiding salty foods.    Diet is excessive in calories and carbs as evidenced by obesity and uncontrolled blood sugars. Sees Dr. Dorris Fetch Lab Results  Component Value Date   HGBA1C 7.9 (H) 09/04/2016   CMP Latest Ref Rng & Units 09/04/2016 05/17/2016 02/13/2016  Glucose 65 - 99 mg/dL 173(H) 168(H) 175(H)  BUN 7 - 25 mg/dL 16 16 16   Creatinine 0.60 - 0.93 mg/dL 0.83 0.79 0.89  Sodium 135 - 146 mmol/L 140 139 138  Potassium 3.5 - 5.3 mmol/L 4.5 5.4(H) 5.3  Chloride 98 - 110 mmol/L 102 105 103  CO2 20 - 31 mmol/L 24 25 24   Calcium 8.6 - 10.4 mg/dL 9.4 8.5(L) 9.3  Total Protein 6.1 - 8.1 g/dL 6.6 6.2 6.4  Total Bilirubin 0.2 - 1.2 mg/dL 0.4 0.3 0.3  Alkaline Phos 33 - 130 U/L 125 117 114  AST 10 - 35 U/L 26 24 16   ALT 6 - 29 U/L 20 29 18      ASSESSMENT  Height 5\' 4"  (1.626 m), weight 274 lb (124.3 kg). Body mass index is 47.03 kg/m.      Diabetes Self-Management Education - 12/03/16 1342      Visit Information   Visit Type First/Initial     Initial Visit   Diabetes Type Type 2   Are you currently following a meal plan? No   Are you taking your medications as prescribed? Yes   Date Diagnosed 2010     Health Coping   How would you rate your overall health? Fair     Psychosocial Assessment   Patient Belief/Attitude about Diabetes Motivated to manage diabetes   Self-care barriers None   Self-management support Family   Other persons present  Patient;Spouse/SO   Patient Concerns Nutrition/Meal planning;Medication;Healthy Lifestyle;Problem Solving;Weight Control   Special Needs None   Preferred Learning Style Hands on;Auditory   Learning Readiness Change in progress   How often do you need to have someone help you when you read instructions, pamphlets, or other written materials from your doctor or pharmacy? 3 - Sometimes   What is the last grade level you completed in school? 10     Pre-Education Assessment   Patient understands the diabetes disease and treatment process. Needs Instruction   Patient understands incorporating nutritional management into lifestyle. Needs Instruction   Patient undertands incorporating physical activity into lifestyle. Needs Instruction   Patient understands using medications safely. Needs Instruction   Patient understands monitoring blood glucose, interpreting and using results Needs Instruction   Patient understands prevention, detection, and treatment of acute complications. Needs Instruction   Patient understands prevention, detection, and treatment of chronic complications. Needs Instruction   Patient understands how to develop strategies to address psychosocial issues. Needs Instruction   Patient understands how to develop strategies to promote health/change behavior. Needs Instruction     Complications   Last HgB A1C per patient/outside source 7.9 %   How often do you check your blood sugar?  1-2 times/day   Fasting Blood glucose range (mg/dL) 180-200;130-179   Postprandial Blood glucose range (mg/dL) 130-179   Number of hypoglycemic episodes per month 0   Number of hyperglycemic episodes per week 5   Can you tell when your blood sugar is high? No   Have you had a dilated eye exam in the past 12 months? Yes   Have you had a dental exam in the past 12 months? Yes   Are you checking your feet? Yes   How many days per week are you checking your feet? 7     Dietary Intake   Breakfast  Frosted flakes or oatmeal,  coffee black   Lunch Grilled-carrots, cauliflower, broccoli and squash and onionns, water   Dinner Grilled-carrots, cauliflower, broccoli and squash and onionns, water   Beverage(s) water     Exercise   Exercise Type ADL's   How many days per week to you exercise? 0      Individualized Plan for Diabetes Self-Management Training:   Learning Objective:  Patient will have a greater understanding of diabetes self-management. Patient education plan is to attend individual and/or group sessions per assessed needs and concerns.   Plan:   Patient Instructions  Goals 1. Follow My Plate 2. Measure portions 3. Cut back on bread.--cut out biscuits. 4. Walk 5-10 minutes a day 5. Increase fresh fruits and vegetables. 6. Lose 1 lb per week. 7. Get A1C to 7% Use Mrs. Dash or herbs and spices.   Expected Outcomes:    Iimproved diabetes knowledge and self management.  Education material provided: Living Well with Diabetes, Food label handouts, A1C conversion sheet, Meal plan card, My Plate and Carbohydrate counting sheet  If problems or questions, patient to contact team via:  Phone and Email  Future DSME appointment:   1 month

## 2016-12-03 NOTE — Patient Instructions (Signed)
Goals 1. Follow My Plate 2. Measure portions 3. Cut back on bread.--cut out biscuits. 4. Walk 5-10 minutes a day 5. Increase fresh fruits and vegetables. 6. Lose 1 lb per week. 7. Get A1C to 7% Use Mrs. Dash or herbs and spices.

## 2016-12-04 ENCOUNTER — Ambulatory Visit (HOSPITAL_COMMUNITY)
Admission: RE | Admit: 2016-12-04 | Discharge: 2016-12-04 | Disposition: A | Discharge status: Home / Self Care | Payer: Medicare Other | Source: Ambulatory Visit | Attending: Pulmonary Disease | Admitting: Pulmonary Disease

## 2016-12-04 DIAGNOSIS — J441 Chronic obstructive pulmonary disease with (acute) exacerbation: Secondary | ICD-10-CM | POA: Diagnosis not present

## 2016-12-04 MED ORDER — ALBUTEROL SULFATE (2.5 MG/3ML) 0.083% IN NEBU
2.5000 mg | INHALATION_SOLUTION | Freq: Once | RESPIRATORY_TRACT | Status: AC
  Administered 2016-12-04: 2.5 mg via RESPIRATORY_TRACT

## 2016-12-05 LAB — PULMONARY FUNCTION TEST
DL/VA % pred: 65 %
DL/VA: 3.12 ml/min/mmHg/L
DLCO COR % PRED: 50 %
DLCO COR: 12.14 ml/min/mmHg
DLCO UNC % PRED: 50 %
DLCO UNC: 12.14 ml/min/mmHg
FEF 25-75 POST: 1.86 L/s
FEF 25-75 PRE: 1.17 L/s
FEF2575-%Change-Post: 58 %
FEF2575-%PRED-POST: 100 %
FEF2575-%PRED-PRE: 63 %
FEV1-%Change-Post: 13 %
FEV1-%Pred-Post: 85 %
FEV1-%Pred-Pre: 75 %
FEV1-PRE: 1.69 L
FEV1-Post: 1.91 L
FEV1FVC-%Change-Post: 9 %
FEV1FVC-%PRED-PRE: 95 %
FEV6-%CHANGE-POST: 4 %
FEV6-%PRED-POST: 85 %
FEV6-%Pred-Pre: 82 %
FEV6-POST: 2.41 L
FEV6-Pre: 2.32 L
FEV6FVC-%PRED-POST: 104 %
FEV6FVC-%Pred-Pre: 104 %
FVC-%Change-Post: 3 %
FVC-%Pred-Post: 81 %
FVC-%Pred-Pre: 78 %
FVC-Post: 2.41 L
FVC-Pre: 2.32 L
PRE FEV1/FVC RATIO: 72 %
Post FEV1/FVC ratio: 79 %
Post FEV6/FVC ratio: 100 %
Pre FEV6/FVC Ratio: 100 %
RV % PRED: 108 %
RV: 2.39 L
TLC % pred: 95 %
TLC: 4.81 L

## 2016-12-06 ENCOUNTER — Other Ambulatory Visit: Payer: Self-pay | Admitting: "Endocrinology

## 2016-12-06 DIAGNOSIS — E1165 Type 2 diabetes mellitus with hyperglycemia: Secondary | ICD-10-CM | POA: Diagnosis not present

## 2016-12-06 DIAGNOSIS — Z794 Long term (current) use of insulin: Secondary | ICD-10-CM | POA: Diagnosis not present

## 2016-12-06 DIAGNOSIS — E1159 Type 2 diabetes mellitus with other circulatory complications: Secondary | ICD-10-CM | POA: Diagnosis not present

## 2016-12-06 LAB — COMPREHENSIVE METABOLIC PANEL
ALBUMIN: 3.9 g/dL (ref 3.6–5.1)
ALK PHOS: 115 U/L (ref 33–130)
ALT: 21 U/L (ref 6–29)
AST: 25 U/L (ref 10–35)
BUN: 13 mg/dL (ref 7–25)
CO2: 25 mmol/L (ref 20–31)
Calcium: 9.1 mg/dL (ref 8.6–10.4)
Chloride: 103 mmol/L (ref 98–110)
Creat: 0.96 mg/dL — ABNORMAL HIGH (ref 0.60–0.93)
Glucose, Bld: 168 mg/dL — ABNORMAL HIGH (ref 65–99)
Potassium: 5.3 mmol/L (ref 3.5–5.3)
Sodium: 138 mmol/L (ref 135–146)
TOTAL PROTEIN: 6.7 g/dL (ref 6.1–8.1)
Total Bilirubin: 0.4 mg/dL (ref 0.2–1.2)

## 2016-12-07 LAB — HEMOGLOBIN A1C
Hgb A1c MFr Bld: 8 % — ABNORMAL HIGH (ref <5.7)
Mean Plasma Glucose: 183 mg/dL

## 2016-12-13 ENCOUNTER — Encounter: Payer: Self-pay | Admitting: "Endocrinology

## 2016-12-13 ENCOUNTER — Ambulatory Visit (INDEPENDENT_AMBULATORY_CARE_PROVIDER_SITE_OTHER): Payer: Medicare Other | Admitting: "Endocrinology

## 2016-12-13 VITALS — BP 138/86 | HR 66 | Ht 64.0 in | Wt 267.0 lb

## 2016-12-13 DIAGNOSIS — E039 Hypothyroidism, unspecified: Secondary | ICD-10-CM

## 2016-12-13 DIAGNOSIS — E2740 Unspecified adrenocortical insufficiency: Secondary | ICD-10-CM

## 2016-12-13 DIAGNOSIS — E1165 Type 2 diabetes mellitus with hyperglycemia: Secondary | ICD-10-CM

## 2016-12-13 DIAGNOSIS — E1159 Type 2 diabetes mellitus with other circulatory complications: Secondary | ICD-10-CM | POA: Diagnosis not present

## 2016-12-13 DIAGNOSIS — Z794 Long term (current) use of insulin: Secondary | ICD-10-CM

## 2016-12-13 DIAGNOSIS — E782 Mixed hyperlipidemia: Secondary | ICD-10-CM | POA: Diagnosis not present

## 2016-12-13 DIAGNOSIS — I10 Essential (primary) hypertension: Secondary | ICD-10-CM

## 2016-12-13 DIAGNOSIS — IMO0002 Reserved for concepts with insufficient information to code with codable children: Secondary | ICD-10-CM
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
## 2016-12-13 NOTE — Patient Instructions (Signed)

## 2016-12-20 DIAGNOSIS — L603 Nail dystrophy: Secondary | ICD-10-CM | POA: Diagnosis not present

## 2016-12-20 DIAGNOSIS — L851 Acquired keratosis [keratoderma] palmaris et plantaris: Secondary | ICD-10-CM | POA: Diagnosis not present

## 2016-12-20 DIAGNOSIS — E1142 Type 2 diabetes mellitus with diabetic polyneuropathy: Secondary | ICD-10-CM | POA: Diagnosis not present

## 2017-01-07 ENCOUNTER — Ambulatory Visit: Payer: Medicare Other | Admitting: Nutrition

## 2017-01-09 ENCOUNTER — Encounter: Payer: Medicare Other | Attending: Pulmonary Disease | Admitting: Nutrition

## 2017-01-09 VITALS — Ht 64.0 in | Wt 264.0 lb

## 2017-01-09 DIAGNOSIS — E118 Type 2 diabetes mellitus with unspecified complications: Secondary | ICD-10-CM

## 2017-01-09 DIAGNOSIS — Z713 Dietary counseling and surveillance: Secondary | ICD-10-CM | POA: Diagnosis not present

## 2017-01-09 DIAGNOSIS — Z794 Long term (current) use of insulin: Secondary | ICD-10-CM

## 2017-01-09 DIAGNOSIS — Z6841 Body Mass Index (BMI) 40.0 and over, adult: Secondary | ICD-10-CM | POA: Insufficient documentation

## 2017-01-09 DIAGNOSIS — IMO0002 Reserved for concepts with insufficient information to code with codable children: Secondary | ICD-10-CM

## 2017-01-09 DIAGNOSIS — E1165 Type 2 diabetes mellitus with hyperglycemia: Secondary | ICD-10-CM

## 2017-01-09 NOTE — Patient Instructions (Addendum)
Goals 1 Keep up the great job! 2. Try romaine or spinach for salad 3. Keep drinking water 4. Keep Eating lots of vegetables. May have 1 cup of Pintos and vegetables. Try 1% milk Lose 10 lbs in the next 2-3 months. Avoid processed meats. Get A1C down to 7.5% or below

## 2017-01-09 NOTE — Progress Notes (Signed)
Diabetes Self-Management Education  Visit Type:    Appt. Start Time: 1330 Appt. End Time: 1500  01/09/2017  [name], identified by name and date of birth, is a 71 y.o. female with a diagnosis of Diabetes:  Marland Kitchen Here with her husband.  Lost 10 lbs since last visit. Taking 50 units of Levermir daily and Metformin 500 mg BID. Testing twice a day.  A1C up to 8% from 7.9%. She notes she has been eating a lot more vegetables and sticking to her diet better since seeing Dr. Dorris Fetch.    Doesn't like to eat meat, so get most of her protein from dried beans, cheese.  BS log brought in:  FBS  160's and before bed: 100-160's  Lab Results  Component Value Date   HGBA1C 8.0 (H) 12/06/2016   CMP Latest Ref Rng & Units 12/06/2016 09/04/2016 05/17/2016  Glucose 65 - 99 mg/dL 168(H) 173(H) 168(H)  BUN 7 - 25 mg/dL 13 16 16   Creatinine 0.60 - 0.93 mg/dL 0.96(H) 0.83 0.79  Sodium 135 - 146 mmol/L 138 140 139  Potassium 3.5 - 5.3 mmol/L 5.3 4.5 5.4(H)  Chloride 98 - 110 mmol/L 103 102 105  CO2 20 - 31 mmol/L 25 24 25   Calcium 8.6 - 10.4 mg/dL 9.1 9.4 8.5(L)  Total Protein 6.1 - 8.1 g/dL 6.7 6.6 6.2  Total Bilirubin 0.2 - 1.2 mg/dL 0.4 0.4 0.3  Alkaline Phos 33 - 130 U/L 115 125 117  AST 10 - 35 U/L 25 26 24   ALT 6 - 29 U/L 21 20 29      ASSESSMENT  Height 5\' 4"  (1.626 m), weight 264 lb (119.7 kg). Body mass index is 45.32 kg/m.    Individualized Plan for Diabetes Self-Management Training:   Learning Objective:  Patient will have a greater understanding of diabetes self-management. Patient education plan is to attend individual and/or group sessions per assessed needs and concerns.   Plan:  Goals 1 Keep up the great job! 2. Try romaine or spinach for salad 3. Keep drinking water 4. Keep Eating lots of vegetables. May have 1 cup of Pintos and vegetables. Try 1% milk Lose 10 lbs in the next 2-3 months. Avoid processed meats. Get A1C 7.5% or below.  Expected Outcomes:    Iimproved  diabetes knowledge and self management.  Education material provided: Living Well with Diabetes, Food label handouts, A1C conversion sheet, Meal plan card, My Plate and Carbohydrate counting sheet  If problems or questions, patient to contact team via:  Phone and Email  Future DSME appointment:   2-3  month

## 2017-01-13 DIAGNOSIS — I10 Essential (primary) hypertension: Secondary | ICD-10-CM | POA: Diagnosis not present

## 2017-01-13 DIAGNOSIS — M545 Low back pain: Secondary | ICD-10-CM | POA: Diagnosis not present

## 2017-01-13 DIAGNOSIS — J449 Chronic obstructive pulmonary disease, unspecified: Secondary | ICD-10-CM | POA: Diagnosis not present

## 2017-01-13 DIAGNOSIS — E1140 Type 2 diabetes mellitus with diabetic neuropathy, unspecified: Secondary | ICD-10-CM | POA: Diagnosis not present

## 2017-01-28 ENCOUNTER — Ambulatory Visit (INDEPENDENT_AMBULATORY_CARE_PROVIDER_SITE_OTHER): Payer: Medicare Other | Admitting: Urology

## 2017-01-28 ENCOUNTER — Other Ambulatory Visit (HOSPITAL_COMMUNITY)
Admission: AD | Admit: 2017-01-28 | Discharge: 2017-01-28 | Disposition: A | Discharge status: Home / Self Care | Payer: Medicare Other | Source: Skilled Nursing Facility | Attending: Pulmonary Disease | Admitting: Pulmonary Disease

## 2017-01-28 DIAGNOSIS — R8271 Bacteriuria: Secondary | ICD-10-CM | POA: Diagnosis not present

## 2017-01-28 DIAGNOSIS — R32 Unspecified urinary incontinence: Secondary | ICD-10-CM

## 2017-01-31 LAB — URINE CULTURE: Culture: >=100000 — AB

## 2017-02-26 ENCOUNTER — Encounter: Payer: Self-pay | Admitting: Gastroenterology

## 2017-02-28 DIAGNOSIS — E1142 Type 2 diabetes mellitus with diabetic polyneuropathy: Secondary | ICD-10-CM | POA: Diagnosis not present

## 2017-02-28 DIAGNOSIS — L851 Acquired keratosis [keratoderma] palmaris et plantaris: Secondary | ICD-10-CM | POA: Diagnosis not present

## 2017-02-28 DIAGNOSIS — L603 Nail dystrophy: Secondary | ICD-10-CM | POA: Diagnosis not present

## 2017-03-10 DIAGNOSIS — E039 Hypothyroidism, unspecified: Secondary | ICD-10-CM | POA: Diagnosis not present

## 2017-03-10 DIAGNOSIS — E1165 Type 2 diabetes mellitus with hyperglycemia: Secondary | ICD-10-CM | POA: Diagnosis not present

## 2017-03-10 DIAGNOSIS — Z794 Long term (current) use of insulin: Secondary | ICD-10-CM | POA: Diagnosis not present

## 2017-03-10 DIAGNOSIS — E1159 Type 2 diabetes mellitus with other circulatory complications: Secondary | ICD-10-CM | POA: Diagnosis not present

## 2017-03-11 ENCOUNTER — Ambulatory Visit (INDEPENDENT_AMBULATORY_CARE_PROVIDER_SITE_OTHER): Payer: Medicare Other | Admitting: Urology

## 2017-03-11 DIAGNOSIS — R32 Unspecified urinary incontinence: Secondary | ICD-10-CM

## 2017-03-11 LAB — RENAL FUNCTION PANEL
ALBUMIN MSPROF: 3.7 g/dL (ref 3.6–5.1)
BUN / CREAT RATIO: 19 (calc) (ref 6–22)
BUN: 18 mg/dL (ref 7–25)
CALCIUM: 9.2 mg/dL (ref 8.6–10.4)
CHLORIDE: 108 mmol/L (ref 98–110)
CO2: 26 mmol/L (ref 20–32)
Creat: 0.97 mg/dL — ABNORMAL HIGH (ref 0.60–0.93)
Glucose, Bld: 263 mg/dL — ABNORMAL HIGH (ref 65–139)
POTASSIUM: 5.1 mmol/L (ref 3.5–5.3)
Phosphorus: 3.2 mg/dL (ref 2.1–4.3)
SODIUM: 141 mmol/L (ref 135–146)

## 2017-03-11 LAB — HEMOGLOBIN A1C
HEMOGLOBIN A1C: 8.1 %{Hb} — AB (ref <5.7)
MEAN PLASMA GLUCOSE: 186 (calc)
eAG (mmol/L): 10.3 (calc)

## 2017-03-11 LAB — TSH: TSH: 0.28 mIU/L — ABNORMAL LOW (ref 0.40–4.50)

## 2017-03-11 LAB — T4, FREE: Free T4: 1.2 ng/dL (ref 0.8–1.8)

## 2017-03-12 LAB — RENAL FUNCTION PANEL

## 2017-03-12 LAB — HEMOGLOBIN A1C

## 2017-03-12 LAB — TSH

## 2017-03-14 ENCOUNTER — Ambulatory Visit (INDEPENDENT_AMBULATORY_CARE_PROVIDER_SITE_OTHER): Payer: Medicare Other | Admitting: "Endocrinology

## 2017-03-14 ENCOUNTER — Encounter: Payer: Self-pay | Admitting: "Endocrinology

## 2017-03-14 VITALS — BP 149/75 | HR 77 | Ht 64.0 in | Wt 265.0 lb

## 2017-03-14 DIAGNOSIS — E039 Hypothyroidism, unspecified: Secondary | ICD-10-CM

## 2017-03-14 DIAGNOSIS — E782 Mixed hyperlipidemia: Secondary | ICD-10-CM

## 2017-03-14 DIAGNOSIS — E1165 Type 2 diabetes mellitus with hyperglycemia: Secondary | ICD-10-CM | POA: Diagnosis not present

## 2017-03-14 DIAGNOSIS — I10 Essential (primary) hypertension: Secondary | ICD-10-CM

## 2017-03-14 DIAGNOSIS — E559 Vitamin D deficiency, unspecified: Secondary | ICD-10-CM | POA: Insufficient documentation

## 2017-03-14 DIAGNOSIS — E2740 Unspecified adrenocortical insufficiency: Secondary | ICD-10-CM

## 2017-03-14 MED ORDER — VITAMIN D3 125 MCG (5000 UT) PO CAPS: 5000.0000 [IU] | ORAL_CAPSULE | Freq: Every day | ORAL | 0 refills | Status: DC

## 2017-03-14 NOTE — Patient Instructions (Signed)
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
## 2017-03-18 DIAGNOSIS — I10 Essential (primary) hypertension: Secondary | ICD-10-CM | POA: Diagnosis not present

## 2017-03-18 DIAGNOSIS — Z23 Encounter for immunization: Secondary | ICD-10-CM | POA: Diagnosis not present

## 2017-03-18 DIAGNOSIS — E1140 Type 2 diabetes mellitus with diabetic neuropathy, unspecified: Secondary | ICD-10-CM | POA: Diagnosis not present

## 2017-03-18 DIAGNOSIS — N3281 Overactive bladder: Secondary | ICD-10-CM | POA: Diagnosis not present

## 2017-03-18 DIAGNOSIS — J449 Chronic obstructive pulmonary disease, unspecified: Secondary | ICD-10-CM | POA: Diagnosis not present

## 2017-03-20 ENCOUNTER — Encounter: Payer: Medicare Other | Attending: Pulmonary Disease | Admitting: Nutrition

## 2017-03-20 VITALS — Ht 64.0 in | Wt 266.0 lb

## 2017-03-20 DIAGNOSIS — E119 Type 2 diabetes mellitus without complications: Secondary | ICD-10-CM | POA: Diagnosis not present

## 2017-03-20 DIAGNOSIS — Z713 Dietary counseling and surveillance: Secondary | ICD-10-CM | POA: Insufficient documentation

## 2017-03-20 DIAGNOSIS — E118 Type 2 diabetes mellitus with unspecified complications: Secondary | ICD-10-CM

## 2017-03-20 DIAGNOSIS — IMO0002 Reserved for concepts with insufficient information to code with codable children: Secondary | ICD-10-CM

## 2017-03-20 DIAGNOSIS — Z6841 Body Mass Index (BMI) 40.0 and over, adult: Secondary | ICD-10-CM | POA: Insufficient documentation

## 2017-03-20 DIAGNOSIS — E1165 Type 2 diabetes mellitus with hyperglycemia: Secondary | ICD-10-CM

## 2017-03-20 NOTE — Patient Instructions (Signed)
Goals Double up on low carb vergetables with lunch and dinner Cut down on salty gravies and sauces. Try chair exercises for weight loss.  Lose 2-3 lbs month Get A1C down to 7.5% or below

## 2017-03-20 NOTE — Progress Notes (Signed)
Diabetes Self-Management Education  Visit Type:    Appt. Start Time: 1330 Appt. End Time: 1500  03/20/2017  [name], identified by name and date of birth, is a 71 y.o. female with a diagnosis of Diabetes:  Marland Kitchen Here with her husband. A1C up from 8.0%. Lost 8 lbs since July 2018.  Sees Dr. Dorris Fetch, Endocrinology. Taking 50 units of  Levemir daily and will stop Novolog for now. Metformin 500 mg BID. Stopping Tradjenta for cost.   Needs to increase physical activity for further weight loss and less processed foods.    Lab Results  Component Value Date   HGBA1C 8.1 (H) 03/10/2017   CMP Latest Ref Rng & Units 03/10/2017 03/10/2017 12/06/2016  Glucose 65 - 139 mg/dL 263(H) CANCELED 168(H)  BUN 7 - 25 mg/dL 18 - 13  Creatinine 0.60 - 0.93 mg/dL 0.97(H) - 0.96(H)  Sodium 135 - 146 mmol/L 141 - 138  Potassium 3.5 - 5.3 mmol/L 5.1 - 5.3  Chloride 98 - 110 mmol/L 108 - 103  CO2 20 - 32 mmol/L 26 - 25  Calcium 8.6 - 10.4 mg/dL 9.2 - 9.1  Total Protein 6.1 - 8.1 g/dL - - 6.7  Total Bilirubin 0.2 - 1.2 mg/dL - - 0.4  Alkaline Phos 33 - 130 U/L - - 115  AST 10 - 35 U/L - - 25  ALT 6 - 29 U/L - - 21     ASSESSMENT  Height 5\' 4"  (1.626 m), weight 266 lb (120.7 kg). Body mass index is 45.66 kg/m.  B) Mini shredded wheat 1/2 c, 1% milk,  Or Eggs and tomatoes  L) chicken tenders, halopenao peppers, and mashed potato and gravy, buttermilk  1 cup D) Homemade tacos, 1, , tomatoes, onions and salsa and sour cream, water  Individualized Plan for Diabetes Self-Management Training:   Learning Objective:  Patient will have a greater understanding of diabetes self-management. Patient education plan is to attend individual and/or group sessions per assessed needs and concerns.   .Goals Double up on low carb vergetables with lunch and dinner Cut down on salty gravies and sauces. Try chair exercises for weight loss.  Lose 2-3 lbs month Get A1C down to 7.5% or below   Expected Outcomes:     Iimproved diabetes knowledge and self management.  Education material provided: Living Well with Diabetes, Food label handouts, A1C conversion sheet, Meal plan card, My Plate and Carbohydrate counting sheet  If problems or questions, patient to contact team via:  Phone and Email  Future DSME appointment:   2-3  month

## 2017-03-24 DIAGNOSIS — L820 Inflamed seborrheic keratosis: Secondary | ICD-10-CM | POA: Diagnosis not present

## 2017-03-24 DIAGNOSIS — D2362 Other benign neoplasm of skin of left upper limb, including shoulder: Secondary | ICD-10-CM | POA: Diagnosis not present

## 2017-03-24 DIAGNOSIS — B078 Other viral warts: Secondary | ICD-10-CM | POA: Diagnosis not present

## 2017-04-10 ENCOUNTER — Ambulatory Visit: Payer: Medicare Other | Admitting: Gastroenterology

## 2017-05-13 DIAGNOSIS — L851 Acquired keratosis [keratoderma] palmaris et plantaris: Secondary | ICD-10-CM | POA: Diagnosis not present

## 2017-05-13 DIAGNOSIS — L603 Nail dystrophy: Secondary | ICD-10-CM | POA: Diagnosis not present

## 2017-05-13 DIAGNOSIS — E1142 Type 2 diabetes mellitus with diabetic polyneuropathy: Secondary | ICD-10-CM | POA: Diagnosis not present

## 2017-05-14 DIAGNOSIS — H179 Unspecified corneal scar and opacity: Secondary | ICD-10-CM | POA: Diagnosis not present

## 2017-05-14 DIAGNOSIS — Z961 Presence of intraocular lens: Secondary | ICD-10-CM | POA: Diagnosis not present

## 2017-05-14 DIAGNOSIS — H16401 Unspecified corneal neovascularization, right eye: Secondary | ICD-10-CM | POA: Diagnosis not present

## 2017-05-14 DIAGNOSIS — E11319 Type 2 diabetes mellitus with unspecified diabetic retinopathy without macular edema: Secondary | ICD-10-CM | POA: Diagnosis not present

## 2017-05-30 DIAGNOSIS — E119 Type 2 diabetes mellitus without complications: Secondary | ICD-10-CM | POA: Diagnosis not present

## 2017-05-30 DIAGNOSIS — I2510 Atherosclerotic heart disease of native coronary artery without angina pectoris: Secondary | ICD-10-CM | POA: Diagnosis not present

## 2017-05-30 DIAGNOSIS — J441 Chronic obstructive pulmonary disease with (acute) exacerbation: Secondary | ICD-10-CM | POA: Diagnosis not present

## 2017-05-30 DIAGNOSIS — I10 Essential (primary) hypertension: Secondary | ICD-10-CM | POA: Diagnosis not present

## 2017-06-05 ENCOUNTER — Ambulatory Visit (INDEPENDENT_AMBULATORY_CARE_PROVIDER_SITE_OTHER): Payer: Medicare Other | Admitting: Gastroenterology

## 2017-06-05 ENCOUNTER — Encounter: Payer: Self-pay | Admitting: Gastroenterology

## 2017-06-05 DIAGNOSIS — K760 Fatty (change of) liver, not elsewhere classified: Secondary | ICD-10-CM

## 2017-06-05 DIAGNOSIS — K219 Gastro-esophageal reflux disease without esophagitis: Secondary | ICD-10-CM

## 2017-06-05 DIAGNOSIS — K5901 Slow transit constipation: Secondary | ICD-10-CM

## 2017-06-05 NOTE — Assessment & Plan Note (Signed)
SYMPTOMS FAIRLY WELL CONTROLLED BU LINZESS MAY CAUSE LOOSE STOOLS.  DRINK WATER TO KEEP YOUR URINE LIGHT YELLOW. CONTINUE PROTONIX. TAKE 30 MINUTES PRIOR TO MEALS TWICE DAILY. CONTINUE LINZESS. OPEN LINZESS CAPSULE. PLACE GRANULES IN 4 TEASPOONS OF WATER. STIR IT FOR 30 SECONDS. TAKE 3 TSP OF THE WATER DAILY. YOU DO NOT NEED TO TAKE THE GRANULES THE MEDICINE IS IN THE WATER.   FOLLOW UP IN 6 MOS.

## 2017-06-05 NOTE — Progress Notes (Signed)
CC'D TO PCP °

## 2017-06-05 NOTE — Assessment & Plan Note (Signed)
SYMPTOMS FAIRLY WELL CONTROLLED.  CONTINUE YOUR WEIGHT LOSS EFFORTS.  DRINK WATER TO KEEP YOUR URINE LIGHT YELLOW. CONTINUE PROTONIX. TAKE 30 MINUTES PRIOR TO MEALS TWICE DAILY. FOLLOW UP IN 6 MOS.
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
## 2017-06-05 NOTE — Assessment & Plan Note (Addendum)
WEIGHT STABLE. LAST HFP NL JUL 2018  CONTINUE YOUR WEIGHT LOSS EFFORTS.

## 2017-06-05 NOTE — Patient Instructions (Addendum)
DRINK WATER TO KEEP YOUR URINE LIGHT YELLOW.  CONTINUE PROTONIX. TAKE 30 MINUTES PRIOR TO MEALS TWICE DAILY.  CONTINUE LINZESS. OPEN LINZESS CAPSULE. PLACE GRANULES IN 4 TEASPOONS OF WATER. STIR IT FOR 30 SECONDS. TAKE 3 TSP OF THE WATER DAILY. YOU DO NOT NEED TO TAKE THE GRANULES THE MEDICINE IS IN THE WATER.   FOLLOW UP IN 6 MOS.

## 2017-06-05 NOTE — Progress Notes (Signed)
ON RECALL  °

## 2017-06-12 DIAGNOSIS — E1165 Type 2 diabetes mellitus with hyperglycemia: Secondary | ICD-10-CM | POA: Diagnosis not present

## 2017-06-13 LAB — COMPLETE METABOLIC PANEL WITH GFR
AG RATIO: 1.4 (calc) (ref 1.0–2.5)
ALT: 18 U/L (ref 6–29)
AST: 20 U/L (ref 10–35)
Albumin: 4.1 g/dL (ref 3.6–5.1)
Alkaline phosphatase (APISO): 106 U/L (ref 33–130)
BUN: 20 mg/dL (ref 7–25)
CHLORIDE: 103 mmol/L (ref 98–110)
CO2: 26 mmol/L (ref 20–32)
Calcium: 9.6 mg/dL (ref 8.6–10.4)
Creat: 0.82 mg/dL (ref 0.60–0.93)
GFR, EST AFRICAN AMERICAN: 83 mL/min/{1.73_m2} (ref >=60)
GFR, Est Non African American: 72 mL/min/{1.73_m2} (ref >=60)
GLOBULIN: 2.9 g/dL (ref 1.9–3.7)
Glucose, Bld: 175 mg/dL — ABNORMAL HIGH (ref 65–99)
Potassium: 5.3 mmol/L (ref 3.5–5.3)
SODIUM: 138 mmol/L (ref 135–146)
TOTAL PROTEIN: 7 g/dL (ref 6.1–8.1)
Total Bilirubin: 0.4 mg/dL (ref 0.2–1.2)

## 2017-06-13 LAB — HEMOGLOBIN A1C
HEMOGLOBIN A1C: 8.4 %{Hb} — AB (ref <5.7)
MEAN PLASMA GLUCOSE: 194 (calc)
eAG (mmol/L): 10.8 (calc)

## 2017-06-17 ENCOUNTER — Ambulatory Visit: Payer: Medicare Other | Admitting: "Endocrinology

## 2017-06-17 ENCOUNTER — Emergency Department (HOSPITAL_COMMUNITY): Payer: Medicare Other

## 2017-06-17 ENCOUNTER — Encounter (HOSPITAL_COMMUNITY): Payer: Self-pay | Admitting: *Deleted

## 2017-06-17 ENCOUNTER — Emergency Department (HOSPITAL_COMMUNITY)
Admission: EM | Admit: 2017-06-17 | Discharge: 2017-06-17 | Disposition: A | Discharge status: Home / Self Care | Payer: Medicare Other | Attending: Emergency Medicine | Admitting: Emergency Medicine

## 2017-06-17 ENCOUNTER — Other Ambulatory Visit: Payer: Self-pay

## 2017-06-17 DIAGNOSIS — E860 Dehydration: Secondary | ICD-10-CM | POA: Insufficient documentation

## 2017-06-17 DIAGNOSIS — R197 Diarrhea, unspecified: Secondary | ICD-10-CM | POA: Diagnosis not present

## 2017-06-17 DIAGNOSIS — I130 Hypertensive heart and chronic kidney disease with heart failure and stage 1 through stage 4 chronic kidney disease, or unspecified chronic kidney disease: Secondary | ICD-10-CM | POA: Diagnosis not present

## 2017-06-17 DIAGNOSIS — J449 Chronic obstructive pulmonary disease, unspecified: Secondary | ICD-10-CM | POA: Insufficient documentation

## 2017-06-17 DIAGNOSIS — R112 Nausea with vomiting, unspecified: Secondary | ICD-10-CM | POA: Diagnosis not present

## 2017-06-17 DIAGNOSIS — I5032 Chronic diastolic (congestive) heart failure: Secondary | ICD-10-CM | POA: Diagnosis not present

## 2017-06-17 DIAGNOSIS — I2510 Atherosclerotic heart disease of native coronary artery without angina pectoris: Secondary | ICD-10-CM | POA: Insufficient documentation

## 2017-06-17 DIAGNOSIS — R1010 Upper abdominal pain, unspecified: Secondary | ICD-10-CM | POA: Diagnosis not present

## 2017-06-17 DIAGNOSIS — E1122 Type 2 diabetes mellitus with diabetic chronic kidney disease: Secondary | ICD-10-CM | POA: Diagnosis not present

## 2017-06-17 DIAGNOSIS — Z87891 Personal history of nicotine dependence: Secondary | ICD-10-CM | POA: Diagnosis not present

## 2017-06-17 DIAGNOSIS — Z794 Long term (current) use of insulin: Secondary | ICD-10-CM | POA: Diagnosis not present

## 2017-06-17 DIAGNOSIS — K760 Fatty (change of) liver, not elsewhere classified: Secondary | ICD-10-CM | POA: Diagnosis not present

## 2017-06-17 DIAGNOSIS — E039 Hypothyroidism, unspecified: Secondary | ICD-10-CM | POA: Diagnosis not present

## 2017-06-17 DIAGNOSIS — Z955 Presence of coronary angioplasty implant and graft: Secondary | ICD-10-CM | POA: Insufficient documentation

## 2017-06-17 DIAGNOSIS — I959 Hypotension, unspecified: Secondary | ICD-10-CM | POA: Diagnosis not present

## 2017-06-17 DIAGNOSIS — R0602 Shortness of breath: Secondary | ICD-10-CM | POA: Diagnosis not present

## 2017-06-17 DIAGNOSIS — N183 Chronic kidney disease, stage 3 (moderate): Secondary | ICD-10-CM | POA: Insufficient documentation

## 2017-06-17 DIAGNOSIS — R531 Weakness: Secondary | ICD-10-CM | POA: Diagnosis not present

## 2017-06-17 LAB — CBC WITH DIFFERENTIAL/PLATELET
BASOS ABS: 0 10*3/uL (ref 0.0–0.1)
BASOS PCT: 0 %
EOS PCT: 3 %
Eosinophils Absolute: 0.6 10*3/uL (ref 0.0–0.7)
HCT: 40.3 % (ref 36.0–46.0)
Hemoglobin: 12.5 g/dL (ref 12.0–15.0)
Lymphocytes Relative: 5 %
Lymphs Abs: 0.8 10*3/uL (ref 0.7–4.0)
MCH: 26.9 pg (ref 26.0–34.0)
MCHC: 31 g/dL (ref 30.0–36.0)
MCV: 86.9 fL (ref 78.0–100.0)
MONO ABS: 0.9 10*3/uL (ref 0.1–1.0)
MONOS PCT: 6 %
Neutro Abs: 14.2 10*3/uL — ABNORMAL HIGH (ref 1.7–7.7)
Neutrophils Relative %: 86 %
PLATELETS: 230 10*3/uL (ref 150–400)
RBC: 4.64 MIL/uL (ref 3.87–5.11)
RDW: 15.5 % (ref 11.5–15.5)
WBC: 16.5 10*3/uL — ABNORMAL HIGH (ref 4.0–10.5)

## 2017-06-17 LAB — I-STAT CG4 LACTIC ACID, ED
Lactic Acid, Venous: 2.47 mmol/L (ref 0.5–1.9)
Lactic Acid, Venous: 0.79 mmol/L (ref 0.5–1.9)

## 2017-06-17 LAB — LIPASE, BLOOD: Lipase: 65 U/L — ABNORMAL HIGH (ref 11–51)

## 2017-06-17 LAB — URINALYSIS, ROUTINE W REFLEX MICROSCOPIC
Bilirubin Urine: NEGATIVE
GLUCOSE, UA: NEGATIVE mg/dL
HGB URINE DIPSTICK: NEGATIVE
KETONES UR: NEGATIVE mg/dL
Leukocytes, UA: NEGATIVE
Nitrite: NEGATIVE
PH: 5 (ref 5.0–8.0)
PROTEIN: NEGATIVE mg/dL
Specific Gravity, Urine: >1.046 — ABNORMAL HIGH (ref 1.005–1.030)

## 2017-06-17 LAB — COMPREHENSIVE METABOLIC PANEL
ALBUMIN: 3.6 g/dL (ref 3.5–5.0)
ALT: 18 U/L (ref 14–54)
ANION GAP: 11 (ref 5–15)
AST: 22 U/L (ref 15–41)
Alkaline Phosphatase: 102 U/L (ref 38–126)
BILIRUBIN TOTAL: 0.7 mg/dL (ref 0.3–1.2)
BUN: 31 mg/dL — AB (ref 6–20)
CHLORIDE: 107 mmol/L (ref 101–111)
CO2: 21 mmol/L — ABNORMAL LOW (ref 22–32)
Calcium: 8.7 mg/dL — ABNORMAL LOW (ref 8.9–10.3)
Creatinine, Ser: 1.01 mg/dL — ABNORMAL HIGH (ref 0.44–1.00)
GFR calc Af Amer: >60 mL/min (ref >60)
GFR, EST NON AFRICAN AMERICAN: 55 mL/min — AB (ref >60)
Glucose, Bld: 180 mg/dL — ABNORMAL HIGH (ref 65–99)
Potassium: 3.8 mmol/L (ref 3.5–5.1)
Sodium: 139 mmol/L (ref 135–145)
Total Protein: 7 g/dL (ref 6.5–8.1)

## 2017-06-17 LAB — TROPONIN I: TROPONIN I: 0.03 ng/mL — AB (ref <0.03)

## 2017-06-17 LAB — BRAIN NATRIURETIC PEPTIDE: B Natriuretic Peptide: 114 pg/mL — ABNORMAL HIGH (ref 0.0–100.0)

## 2017-06-17 MED ORDER — HYDROCORTISONE NA SUCCINATE PF 100 MG IJ SOLR
100.0000 mg | Freq: Once | INTRAMUSCULAR | Status: AC
  Administered 2017-06-17: 100 mg via INTRAVENOUS
  Filled 2017-06-17: qty 2

## 2017-06-17 MED ORDER — HEPARIN SOD (PORK) LOCK FLUSH 100 UNIT/ML IV SOLN
INTRAVENOUS | Status: AC
  Administered 2017-06-17: 5 [IU]
  Filled 2017-06-17: qty 5

## 2017-06-17 MED ORDER — ONDANSETRON HCL 4 MG/2ML IJ SOLN
4.0000 mg | Freq: Once | INTRAMUSCULAR | Status: AC
  Administered 2017-06-17: 4 mg via INTRAVENOUS
  Filled 2017-06-17: qty 2

## 2017-06-17 MED ORDER — IOPAMIDOL (ISOVUE-300) INJECTION 61%
100.0000 mL | Freq: Once | INTRAVENOUS | Status: AC | PRN
  Administered 2017-06-17: 100 mL via INTRAVENOUS

## 2017-06-17 MED ORDER — ONDANSETRON HCL 4 MG PO TABS: 4.0000 mg | ORAL_TABLET | Freq: Four times a day (QID) | ORAL | 0 refills | Status: DC | PRN

## 2017-06-17 MED ORDER — SODIUM CHLORIDE 0.9 % IV BOLUS (SEPSIS)
1000.0000 mL | Freq: Once | INTRAVENOUS | Status: AC
  Administered 2017-06-17: 1000 mL via INTRAVENOUS
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
## 2017-06-17 NOTE — ED Notes (Signed)
Informed not to take Metformin for next 2 days.  Pt and family understands.

## 2017-06-17 NOTE — ED Triage Notes (Signed)
Pt c/o n/v/d x 1 day;

## 2017-06-17 NOTE — ED Notes (Signed)
Date and time results received: 06/17/17 0430 (use smartphrase ".now" to insert current time)  Test: I stat lactic acid Critical Value: 2.47  Name of Provider Notified: Dr Wyvonnia Dusky  Orders Received? Or Actions Taken?: Actions Taken: no orders received

## 2017-06-17 NOTE — ED Notes (Signed)
Pt given ice water to sip on 

## 2017-06-17 NOTE — ED Notes (Signed)
Pt had vomiting episode x1. Changed into new gown and provided clean warm blankets.

## 2017-06-17 NOTE — ED Notes (Signed)
Tolerated fluid intake well.

## 2017-06-17 NOTE — ED Notes (Signed)
Ambulated pt down hallway. Pt ambulated with the assistance of a walker and had a steady gait and no complaints

## 2017-06-17 NOTE — ED Notes (Signed)
Date and time results received: 06/17/17 0456 (use smartphrase ".now" to insert current time)  Test: troponin Critical Value: 0.03  Name of Provider Notified: Dr Wyvonnia Dusky  Orders Received? Or Actions Taken?: Actions Taken: no orders received

## 2017-06-18 DIAGNOSIS — A419 Sepsis, unspecified organism: Secondary | ICD-10-CM | POA: Diagnosis not present

## 2017-06-18 DIAGNOSIS — E1140 Type 2 diabetes mellitus with diabetic neuropathy, unspecified: Secondary | ICD-10-CM | POA: Diagnosis not present

## 2017-06-18 DIAGNOSIS — E279 Disorder of adrenal gland, unspecified: Secondary | ICD-10-CM | POA: Diagnosis not present

## 2017-06-18 DIAGNOSIS — A084 Viral intestinal infection, unspecified: Secondary | ICD-10-CM | POA: Diagnosis not present

## 2017-06-24 DIAGNOSIS — I110 Hypertensive heart disease with heart failure: Secondary | ICD-10-CM | POA: Diagnosis not present

## 2017-06-24 DIAGNOSIS — E1140 Type 2 diabetes mellitus with diabetic neuropathy, unspecified: Secondary | ICD-10-CM | POA: Diagnosis not present

## 2017-06-24 DIAGNOSIS — J449 Chronic obstructive pulmonary disease, unspecified: Secondary | ICD-10-CM | POA: Diagnosis not present

## 2017-06-24 DIAGNOSIS — I2510 Atherosclerotic heart disease of native coronary artery without angina pectoris: Secondary | ICD-10-CM | POA: Diagnosis not present

## 2017-07-01 ENCOUNTER — Ambulatory Visit: Payer: Medicare Other | Admitting: Nutrition

## 2017-07-02 ENCOUNTER — Ambulatory Visit (HOSPITAL_COMMUNITY)
Admission: RE | Admit: 2017-07-02 | Discharge: 2017-07-02 | Disposition: A | Discharge status: Home / Self Care | Payer: Medicare Other | Source: Ambulatory Visit | Attending: Pulmonary Disease | Admitting: Pulmonary Disease

## 2017-07-02 ENCOUNTER — Other Ambulatory Visit (HOSPITAL_COMMUNITY): Payer: Self-pay | Admitting: Pulmonary Disease

## 2017-07-02 DIAGNOSIS — J441 Chronic obstructive pulmonary disease with (acute) exacerbation: Secondary | ICD-10-CM | POA: Diagnosis not present

## 2017-07-02 DIAGNOSIS — R509 Fever, unspecified: Secondary | ICD-10-CM | POA: Diagnosis not present

## 2017-07-02 DIAGNOSIS — J984 Other disorders of lung: Secondary | ICD-10-CM | POA: Insufficient documentation

## 2017-07-02 DIAGNOSIS — J9811 Atelectasis: Secondary | ICD-10-CM | POA: Insufficient documentation

## 2017-07-02 DIAGNOSIS — E1140 Type 2 diabetes mellitus with diabetic neuropathy, unspecified: Secondary | ICD-10-CM | POA: Diagnosis not present

## 2017-07-02 DIAGNOSIS — I2510 Atherosclerotic heart disease of native coronary artery without angina pectoris: Secondary | ICD-10-CM | POA: Diagnosis not present

## 2017-07-02 DIAGNOSIS — R0602 Shortness of breath: Secondary | ICD-10-CM | POA: Diagnosis not present

## 2017-07-02 DIAGNOSIS — R05 Cough: Secondary | ICD-10-CM | POA: Diagnosis not present

## 2017-07-02 DIAGNOSIS — I110 Hypertensive heart disease with heart failure: Secondary | ICD-10-CM | POA: Diagnosis not present

## 2017-07-03 DIAGNOSIS — I10 Essential (primary) hypertension: Secondary | ICD-10-CM | POA: Diagnosis not present

## 2017-07-03 DIAGNOSIS — E119 Type 2 diabetes mellitus without complications: Secondary | ICD-10-CM | POA: Diagnosis not present

## 2017-07-03 DIAGNOSIS — J441 Chronic obstructive pulmonary disease with (acute) exacerbation: Secondary | ICD-10-CM | POA: Diagnosis not present

## 2017-07-03 DIAGNOSIS — J111 Influenza due to unidentified influenza virus with other respiratory manifestations: Secondary | ICD-10-CM | POA: Diagnosis not present

## 2017-07-22 DIAGNOSIS — L603 Nail dystrophy: Secondary | ICD-10-CM | POA: Diagnosis not present

## 2017-07-22 DIAGNOSIS — E1142 Type 2 diabetes mellitus with diabetic polyneuropathy: Secondary | ICD-10-CM | POA: Diagnosis not present

## 2017-07-22 DIAGNOSIS — L851 Acquired keratosis [keratoderma] palmaris et plantaris: Secondary | ICD-10-CM | POA: Diagnosis not present

## 2017-08-11 ENCOUNTER — Encounter: Payer: Self-pay | Admitting: "Endocrinology

## 2017-08-11 ENCOUNTER — Ambulatory Visit (INDEPENDENT_AMBULATORY_CARE_PROVIDER_SITE_OTHER): Payer: Medicare Other | Admitting: "Endocrinology

## 2017-08-11 VITALS — BP 165/79 | HR 71 | Ht 63.0 in | Wt 260.0 lb

## 2017-08-11 DIAGNOSIS — I10 Essential (primary) hypertension: Secondary | ICD-10-CM | POA: Diagnosis not present

## 2017-08-11 DIAGNOSIS — E2740 Unspecified adrenocortical insufficiency: Secondary | ICD-10-CM

## 2017-08-11 DIAGNOSIS — E782 Mixed hyperlipidemia: Secondary | ICD-10-CM | POA: Diagnosis not present

## 2017-08-11 DIAGNOSIS — E039 Hypothyroidism, unspecified: Secondary | ICD-10-CM | POA: Diagnosis not present

## 2017-08-11 DIAGNOSIS — E1165 Type 2 diabetes mellitus with hyperglycemia: Secondary | ICD-10-CM | POA: Diagnosis not present

## 2017-08-11 NOTE — Patient Instructions (Signed)
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
## 2017-09-17 DIAGNOSIS — J301 Allergic rhinitis due to pollen: Secondary | ICD-10-CM | POA: Diagnosis not present

## 2017-09-17 DIAGNOSIS — I2510 Atherosclerotic heart disease of native coronary artery without angina pectoris: Secondary | ICD-10-CM | POA: Diagnosis not present

## 2017-09-17 DIAGNOSIS — E279 Disorder of adrenal gland, unspecified: Secondary | ICD-10-CM | POA: Diagnosis not present

## 2017-09-17 DIAGNOSIS — J449 Chronic obstructive pulmonary disease, unspecified: Secondary | ICD-10-CM | POA: Diagnosis not present

## 2017-09-30 DIAGNOSIS — L851 Acquired keratosis [keratoderma] palmaris et plantaris: Secondary | ICD-10-CM | POA: Diagnosis not present

## 2017-09-30 DIAGNOSIS — E1142 Type 2 diabetes mellitus with diabetic polyneuropathy: Secondary | ICD-10-CM | POA: Diagnosis not present

## 2017-09-30 DIAGNOSIS — L603 Nail dystrophy: Secondary | ICD-10-CM | POA: Diagnosis not present

## 2017-10-08 DIAGNOSIS — M19011 Primary osteoarthritis, right shoulder: Secondary | ICD-10-CM | POA: Diagnosis not present

## 2017-10-08 DIAGNOSIS — M5030 Other cervical disc degeneration, unspecified cervical region: Secondary | ICD-10-CM | POA: Diagnosis not present

## 2017-10-29 ENCOUNTER — Encounter: Payer: Self-pay | Admitting: Gastroenterology

## 2017-11-05 DIAGNOSIS — E039 Hypothyroidism, unspecified: Secondary | ICD-10-CM | POA: Diagnosis not present

## 2017-11-05 DIAGNOSIS — E1165 Type 2 diabetes mellitus with hyperglycemia: Secondary | ICD-10-CM | POA: Diagnosis not present

## 2017-11-06 LAB — COMPLETE METABOLIC PANEL WITH GFR
AG Ratio: 1.5 (calc) (ref 1.0–2.5)
ALBUMIN MSPROF: 4 g/dL (ref 3.6–5.1)
ALT: 20 U/L (ref 6–29)
AST: 21 U/L (ref 10–35)
Alkaline phosphatase (APISO): 125 U/L (ref 33–130)
BILIRUBIN TOTAL: 0.3 mg/dL (ref 0.2–1.2)
BUN: 17 mg/dL (ref 7–25)
CHLORIDE: 104 mmol/L (ref 98–110)
CO2: 26 mmol/L (ref 20–32)
CREATININE: 0.81 mg/dL (ref 0.60–0.93)
Calcium: 9.1 mg/dL (ref 8.6–10.4)
GFR, EST AFRICAN AMERICAN: 84 mL/min/{1.73_m2} (ref >=60)
GFR, Est Non African American: 73 mL/min/{1.73_m2} (ref >=60)
GLOBULIN: 2.6 g/dL (ref 1.9–3.7)
GLUCOSE: 186 mg/dL — AB (ref 65–99)
Potassium: 5 mmol/L (ref 3.5–5.3)
SODIUM: 139 mmol/L (ref 135–146)
TOTAL PROTEIN: 6.6 g/dL (ref 6.1–8.1)

## 2017-11-06 LAB — T4, FREE: FREE T4: 1.2 ng/dL (ref 0.8–1.8)

## 2017-11-06 LAB — HEMOGLOBIN A1C
EAG (MMOL/L): 9.8 (calc)
HEMOGLOBIN A1C: 7.8 %{Hb} — AB (ref <5.7)
MEAN PLASMA GLUCOSE: 177 (calc)

## 2017-11-06 LAB — TSH: TSH: 12.01 mIU/L — ABNORMAL HIGH (ref 0.40–4.50)

## 2017-11-12 ENCOUNTER — Ambulatory Visit (INDEPENDENT_AMBULATORY_CARE_PROVIDER_SITE_OTHER): Payer: Medicare Other | Admitting: "Endocrinology

## 2017-11-12 ENCOUNTER — Encounter: Payer: Self-pay | Admitting: "Endocrinology

## 2017-11-12 VITALS — BP 133/78 | HR 69 | Ht 63.0 in | Wt 273.0 lb

## 2017-11-12 DIAGNOSIS — E1165 Type 2 diabetes mellitus with hyperglycemia: Secondary | ICD-10-CM

## 2017-11-12 DIAGNOSIS — E2740 Unspecified adrenocortical insufficiency: Secondary | ICD-10-CM

## 2017-11-12 DIAGNOSIS — I10 Essential (primary) hypertension: Secondary | ICD-10-CM | POA: Diagnosis not present

## 2017-11-12 DIAGNOSIS — E782 Mixed hyperlipidemia: Secondary | ICD-10-CM

## 2017-11-12 DIAGNOSIS — E039 Hypothyroidism, unspecified: Secondary | ICD-10-CM
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
## 2017-11-12 NOTE — Patient Instructions (Signed)

## 2017-12-09 DIAGNOSIS — L603 Nail dystrophy: Secondary | ICD-10-CM | POA: Diagnosis not present

## 2017-12-09 DIAGNOSIS — E1142 Type 2 diabetes mellitus with diabetic polyneuropathy: Secondary | ICD-10-CM | POA: Diagnosis not present

## 2017-12-09 DIAGNOSIS — L851 Acquired keratosis [keratoderma] palmaris et plantaris: Secondary | ICD-10-CM | POA: Diagnosis not present

## 2017-12-17 DIAGNOSIS — J449 Chronic obstructive pulmonary disease, unspecified: Secondary | ICD-10-CM | POA: Diagnosis not present

## 2017-12-17 DIAGNOSIS — E1140 Type 2 diabetes mellitus with diabetic neuropathy, unspecified: Secondary | ICD-10-CM | POA: Diagnosis not present

## 2017-12-17 DIAGNOSIS — I10 Essential (primary) hypertension: Secondary | ICD-10-CM | POA: Diagnosis not present

## 2017-12-17 DIAGNOSIS — I2510 Atherosclerotic heart disease of native coronary artery without angina pectoris: Secondary | ICD-10-CM | POA: Diagnosis not present

## 2017-12-31 ENCOUNTER — Ambulatory Visit (INDEPENDENT_AMBULATORY_CARE_PROVIDER_SITE_OTHER): Payer: Medicare Other | Admitting: Gastroenterology

## 2017-12-31 ENCOUNTER — Encounter: Payer: Self-pay | Admitting: Gastroenterology

## 2017-12-31 DIAGNOSIS — K219 Gastro-esophageal reflux disease without esophagitis: Secondary | ICD-10-CM | POA: Diagnosis not present

## 2017-12-31 DIAGNOSIS — K760 Fatty (change of) liver, not elsewhere classified: Secondary | ICD-10-CM

## 2017-12-31 DIAGNOSIS — K5901 Slow transit constipation: Secondary | ICD-10-CM | POA: Diagnosis not present

## 2017-12-31 NOTE — Patient Instructions (Addendum)
DRINK WATER TO KEEP YOUR URINE LIGHT YELLOW.   CONTINUE YOUR WEIGHT LOSS EFFORTS.  FOLLOW A HIGH FIBER DIET. AVOID ITEMS THAT CAUSE BLOATING & GAS.   CONTINUE PROTONIX. TAKE 30 MINUTES PRIOR TO MEALS ONCE OR TWICE DAILY.  CONTINUE LINZESS.  PLEASE CALL WITH QUESTIONS OR CONCERNS.  FOLLOW UP IN 6 MOS.

## 2017-12-31 NOTE — Assessment & Plan Note (Signed)
BMI > 40.  CONTINUE YOUR WEIGHT LOSS EFFORTS. FOLLOW UP IN 6 MOS.

## 2017-12-31 NOTE — Assessment & Plan Note (Addendum)
SYMPTOMS FAIRLY WELL CONTROLLED. LAST Cr JUN 2019: NL.  CONTINUE TO MONITOR SYMPTOMS. AVOID REFLUX TRIGGERS PROTONIX 1-2X/DAILY. FOLLOW UP IN 6 MOS.

## 2017-12-31 NOTE — Assessment & Plan Note (Signed)
SYMPTOMS CONTROLLED/RESOLVED.  DRINK WATER TO KEEP YOUR URINE LIGHT YELLOW.  FOLLOW A HIGH FIBER DIET. AVOID ITEMS THAT CAUSE BLOATING & GAS. CONTINUE LINZESS. PLEASE CALL WITH QUESTIONS OR CONCERNS.  FOLLOW UP IN 6 MOS.
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
## 2018-01-01 NOTE — Progress Notes (Signed)
On recall  °

## 2018-01-01 NOTE — Progress Notes (Signed)
CC'D TO PCP °

## 2018-02-04 ENCOUNTER — Ambulatory Visit: Payer: Medicare Other | Admitting: Gastroenterology

## 2018-02-16 DIAGNOSIS — E279 Disorder of adrenal gland, unspecified: Secondary | ICD-10-CM | POA: Diagnosis not present

## 2018-02-16 DIAGNOSIS — E1140 Type 2 diabetes mellitus with diabetic neuropathy, unspecified: Secondary | ICD-10-CM | POA: Diagnosis not present

## 2018-02-16 DIAGNOSIS — Z23 Encounter for immunization: Secondary | ICD-10-CM | POA: Diagnosis not present

## 2018-02-16 DIAGNOSIS — J441 Chronic obstructive pulmonary disease with (acute) exacerbation: Secondary | ICD-10-CM | POA: Diagnosis not present

## 2018-02-16 DIAGNOSIS — I2510 Atherosclerotic heart disease of native coronary artery without angina pectoris: Secondary | ICD-10-CM | POA: Diagnosis not present

## 2018-02-24 DIAGNOSIS — L603 Nail dystrophy: Secondary | ICD-10-CM | POA: Diagnosis not present

## 2018-02-24 DIAGNOSIS — E1142 Type 2 diabetes mellitus with diabetic polyneuropathy: Secondary | ICD-10-CM | POA: Diagnosis not present

## 2018-02-24 DIAGNOSIS — L851 Acquired keratosis [keratoderma] palmaris et plantaris: Secondary | ICD-10-CM | POA: Diagnosis not present

## 2018-03-09 DIAGNOSIS — E039 Hypothyroidism, unspecified: Secondary | ICD-10-CM | POA: Diagnosis not present

## 2018-03-09 DIAGNOSIS — E1165 Type 2 diabetes mellitus with hyperglycemia: Secondary | ICD-10-CM | POA: Diagnosis not present

## 2018-03-10 LAB — COMPLETE METABOLIC PANEL WITH GFR
AG Ratio: 1.6 (calc) (ref 1.0–2.5)
ALKALINE PHOSPHATASE (APISO): 104 U/L (ref 33–130)
ALT: 16 U/L (ref 6–29)
AST: 15 U/L (ref 10–35)
Albumin: 3.9 g/dL (ref 3.6–5.1)
BUN: 16 mg/dL (ref 7–25)
CO2: 29 mmol/L (ref 20–32)
Calcium: 9.3 mg/dL (ref 8.6–10.4)
Chloride: 106 mmol/L (ref 98–110)
Creat: 0.69 mg/dL (ref 0.60–0.93)
GFR, Est African American: 101 mL/min/{1.73_m2} (ref >=60)
GFR, Est Non African American: 87 mL/min/{1.73_m2} (ref >=60)
GLOBULIN: 2.5 g/dL (ref 1.9–3.7)
GLUCOSE: 130 mg/dL — AB (ref 65–99)
Potassium: 4.9 mmol/L (ref 3.5–5.3)
SODIUM: 142 mmol/L (ref 135–146)
Total Bilirubin: 0.3 mg/dL (ref 0.2–1.2)
Total Protein: 6.4 g/dL (ref 6.1–8.1)

## 2018-03-10 LAB — HEMOGLOBIN A1C
EAG (MMOL/L): 10 (calc)
Hgb A1c MFr Bld: 7.9 % of total Hgb — ABNORMAL HIGH (ref <5.7)
MEAN PLASMA GLUCOSE: 180 (calc)

## 2018-03-10 LAB — T4, FREE: FREE T4: 1.5 ng/dL (ref 0.8–1.8)

## 2018-03-10 LAB — TSH: TSH: 0.49 m[IU]/L (ref 0.40–4.50)

## 2018-03-16 ENCOUNTER — Ambulatory Visit (INDEPENDENT_AMBULATORY_CARE_PROVIDER_SITE_OTHER): Payer: Medicare Other | Admitting: "Endocrinology

## 2018-03-16 ENCOUNTER — Encounter: Payer: Self-pay | Admitting: "Endocrinology

## 2018-03-16 VITALS — BP 143/82 | HR 73

## 2018-03-16 DIAGNOSIS — E039 Hypothyroidism, unspecified: Secondary | ICD-10-CM | POA: Diagnosis not present

## 2018-03-16 DIAGNOSIS — E782 Mixed hyperlipidemia: Secondary | ICD-10-CM | POA: Diagnosis not present

## 2018-03-16 DIAGNOSIS — E1165 Type 2 diabetes mellitus with hyperglycemia: Secondary | ICD-10-CM

## 2018-03-16 DIAGNOSIS — I10 Essential (primary) hypertension: Secondary | ICD-10-CM

## 2018-03-16 DIAGNOSIS — E2740 Unspecified adrenocortical insufficiency: Secondary | ICD-10-CM | POA: Diagnosis not present

## 2018-03-16 MED ORDER — INSULIN DETEMIR 100 UNIT/ML FLEXPEN: 60.0000 [IU] | PEN_INJECTOR | Freq: Every day | SUBCUTANEOUS | 2 refills | Status: DC

## 2018-03-16 NOTE — Patient Instructions (Signed)
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
## 2018-04-04 ENCOUNTER — Inpatient Hospital Stay (HOSPITAL_COMMUNITY)
Admission: EM | Admit: 2018-04-04 | Discharge: 2018-04-11 | DRG: 190 | Disposition: A | Discharge status: Home Health | Payer: Medicare Other | Attending: Pulmonary Disease | Admitting: Pulmonary Disease

## 2018-04-04 ENCOUNTER — Emergency Department (HOSPITAL_COMMUNITY): Payer: Medicare Other

## 2018-04-04 ENCOUNTER — Other Ambulatory Visit: Payer: Self-pay

## 2018-04-04 ENCOUNTER — Encounter (HOSPITAL_COMMUNITY): Payer: Self-pay | Admitting: *Deleted

## 2018-04-04 DIAGNOSIS — Z794 Long term (current) use of insulin: Secondary | ICD-10-CM

## 2018-04-04 DIAGNOSIS — Z9841 Cataract extraction status, right eye: Secondary | ICD-10-CM

## 2018-04-04 DIAGNOSIS — Z86718 Personal history of other venous thrombosis and embolism: Secondary | ICD-10-CM

## 2018-04-04 DIAGNOSIS — M542 Cervicalgia: Secondary | ICD-10-CM | POA: Diagnosis present

## 2018-04-04 DIAGNOSIS — N189 Chronic kidney disease, unspecified: Secondary | ICD-10-CM | POA: Diagnosis present

## 2018-04-04 DIAGNOSIS — R9431 Abnormal electrocardiogram [ECG] [EKG]: Secondary | ICD-10-CM | POA: Diagnosis present

## 2018-04-04 DIAGNOSIS — E1142 Type 2 diabetes mellitus with diabetic polyneuropathy: Secondary | ICD-10-CM | POA: Diagnosis not present

## 2018-04-04 DIAGNOSIS — I2510 Atherosclerotic heart disease of native coronary artery without angina pectoris: Secondary | ICD-10-CM | POA: Diagnosis present

## 2018-04-04 DIAGNOSIS — E1122 Type 2 diabetes mellitus with diabetic chronic kidney disease: Secondary | ICD-10-CM | POA: Diagnosis present

## 2018-04-04 DIAGNOSIS — E2740 Unspecified adrenocortical insufficiency: Secondary | ICD-10-CM | POA: Diagnosis not present

## 2018-04-04 DIAGNOSIS — I10 Essential (primary) hypertension: Secondary | ICD-10-CM | POA: Diagnosis present

## 2018-04-04 DIAGNOSIS — Z79890 Hormone replacement therapy (postmenopausal): Secondary | ICD-10-CM

## 2018-04-04 DIAGNOSIS — Z90710 Acquired absence of both cervix and uterus: Secondary | ICD-10-CM

## 2018-04-04 DIAGNOSIS — J441 Chronic obstructive pulmonary disease with (acute) exacerbation: Principal | ICD-10-CM | POA: Diagnosis present

## 2018-04-04 DIAGNOSIS — M549 Dorsalgia, unspecified: Secondary | ICD-10-CM | POA: Diagnosis present

## 2018-04-04 DIAGNOSIS — Z6841 Body Mass Index (BMI) 40.0 and over, adult: Secondary | ICD-10-CM | POA: Diagnosis not present

## 2018-04-04 DIAGNOSIS — Z961 Presence of intraocular lens: Secondary | ICD-10-CM | POA: Diagnosis present

## 2018-04-04 DIAGNOSIS — J9600 Acute respiratory failure, unspecified whether with hypoxia or hypercapnia: Secondary | ICD-10-CM

## 2018-04-04 DIAGNOSIS — Z9109 Other allergy status, other than to drugs and biological substances: Secondary | ICD-10-CM

## 2018-04-04 DIAGNOSIS — Z8249 Family history of ischemic heart disease and other diseases of the circulatory system: Secondary | ICD-10-CM

## 2018-04-04 DIAGNOSIS — E538 Deficiency of other specified B group vitamins: Secondary | ICD-10-CM | POA: Diagnosis present

## 2018-04-04 DIAGNOSIS — E119 Type 2 diabetes mellitus without complications: Secondary | ICD-10-CM

## 2018-04-04 DIAGNOSIS — Z7901 Long term (current) use of anticoagulants: Secondary | ICD-10-CM

## 2018-04-04 DIAGNOSIS — I130 Hypertensive heart and chronic kidney disease with heart failure and stage 1 through stage 4 chronic kidney disease, or unspecified chronic kidney disease: Secondary | ICD-10-CM | POA: Diagnosis not present

## 2018-04-04 DIAGNOSIS — E785 Hyperlipidemia, unspecified: Secondary | ICD-10-CM | POA: Diagnosis present

## 2018-04-04 DIAGNOSIS — Z886 Allergy status to analgesic agent status: Secondary | ICD-10-CM

## 2018-04-04 DIAGNOSIS — G8929 Other chronic pain: Secondary | ICD-10-CM | POA: Diagnosis present

## 2018-04-04 DIAGNOSIS — H409 Unspecified glaucoma: Secondary | ICD-10-CM | POA: Diagnosis present

## 2018-04-04 DIAGNOSIS — K219 Gastro-esophageal reflux disease without esophagitis: Secondary | ICD-10-CM | POA: Diagnosis present

## 2018-04-04 DIAGNOSIS — J9621 Acute and chronic respiratory failure with hypoxia: Secondary | ICD-10-CM | POA: Diagnosis not present

## 2018-04-04 DIAGNOSIS — J449 Chronic obstructive pulmonary disease, unspecified: Secondary | ICD-10-CM

## 2018-04-04 DIAGNOSIS — Z9842 Cataract extraction status, left eye: Secondary | ICD-10-CM

## 2018-04-04 DIAGNOSIS — E039 Hypothyroidism, unspecified: Secondary | ICD-10-CM | POA: Diagnosis present

## 2018-04-04 DIAGNOSIS — I5032 Chronic diastolic (congestive) heart failure: Secondary | ICD-10-CM | POA: Diagnosis present

## 2018-04-04 DIAGNOSIS — Z888 Allergy status to other drugs, medicaments and biological substances status: Secondary | ICD-10-CM

## 2018-04-04 DIAGNOSIS — Z79899 Other long term (current) drug therapy: Secondary | ICD-10-CM

## 2018-04-04 DIAGNOSIS — M1990 Unspecified osteoarthritis, unspecified site: Secondary | ICD-10-CM | POA: Diagnosis present

## 2018-04-04 DIAGNOSIS — R05 Cough: Secondary | ICD-10-CM | POA: Diagnosis not present

## 2018-04-04 DIAGNOSIS — R0602 Shortness of breath: Secondary | ICD-10-CM | POA: Diagnosis not present

## 2018-04-04 DIAGNOSIS — I480 Paroxysmal atrial fibrillation: Secondary | ICD-10-CM | POA: Diagnosis present

## 2018-04-04 DIAGNOSIS — Z87891 Personal history of nicotine dependence: Secondary | ICD-10-CM

## 2018-04-04 DIAGNOSIS — Z7952 Long term (current) use of systemic steroids: Secondary | ICD-10-CM

## 2018-04-04 LAB — MAGNESIUM: Magnesium: 1.6 mg/dL — ABNORMAL LOW (ref 1.7–2.4)

## 2018-04-04 LAB — BASIC METABOLIC PANEL
Anion gap: 9 (ref 5–15)
BUN: 13 mg/dL (ref 8–23)
CALCIUM: 8.9 mg/dL (ref 8.9–10.3)
CO2: 24 mmol/L (ref 22–32)
CREATININE: 0.8 mg/dL (ref 0.44–1.00)
Chloride: 107 mmol/L (ref 98–111)
GFR calc Af Amer: >60 mL/min (ref >60)
GFR calc non Af Amer: >60 mL/min (ref >60)
GLUCOSE: 141 mg/dL — AB (ref 70–99)
Potassium: 3.5 mmol/L (ref 3.5–5.1)
Sodium: 140 mmol/L (ref 135–145)

## 2018-04-04 LAB — CBC WITH DIFFERENTIAL/PLATELET
Abs Immature Granulocytes: 0.02 10*3/uL (ref 0.00–0.07)
Basophils Absolute: 0 10*3/uL (ref 0.0–0.1)
Basophils Relative: 0 %
EOS ABS: 0.4 10*3/uL (ref 0.0–0.5)
EOS PCT: 5 %
HEMATOCRIT: 38.3 % (ref 36.0–46.0)
Hemoglobin: 11.2 g/dL — ABNORMAL LOW (ref 12.0–15.0)
IMMATURE GRANULOCYTES: 0 %
LYMPHS ABS: 2.5 10*3/uL (ref 0.7–4.0)
Lymphocytes Relative: 33 %
MCH: 24.9 pg — ABNORMAL LOW (ref 26.0–34.0)
MCHC: 29.2 g/dL — ABNORMAL LOW (ref 30.0–36.0)
MCV: 85.3 fL (ref 80.0–100.0)
MONOS PCT: 11 %
Monocytes Absolute: 0.8 10*3/uL (ref 0.1–1.0)
NEUTROS PCT: 51 %
Neutro Abs: 3.8 10*3/uL (ref 1.7–7.7)
Platelets: 211 10*3/uL (ref 150–400)
RBC: 4.49 MIL/uL (ref 3.87–5.11)
RDW: 16.5 % — AB (ref 11.5–15.5)
WBC: 7.6 10*3/uL (ref 4.0–10.5)
nRBC: 0 % (ref 0.0–0.2)

## 2018-04-04 LAB — HEPATIC FUNCTION PANEL
ALT: 20 U/L (ref 0–44)
AST: 24 U/L (ref 15–41)
Albumin: 3.6 g/dL (ref 3.5–5.0)
Alkaline Phosphatase: 86 U/L (ref 38–126)
BILIRUBIN INDIRECT: 0.4 mg/dL (ref 0.3–0.9)
Bilirubin, Direct: 0.1 mg/dL (ref 0.0–0.2)
TOTAL PROTEIN: 6.8 g/dL (ref 6.5–8.1)
Total Bilirubin: 0.5 mg/dL (ref 0.3–1.2)

## 2018-04-04 LAB — MRSA PCR SCREENING: MRSA BY PCR: POSITIVE — AB

## 2018-04-04 LAB — PHOSPHORUS: Phosphorus: 2.8 mg/dL (ref 2.5–4.6)

## 2018-04-04 MED ORDER — IPRATROPIUM-ALBUTEROL 0.5-2.5 (3) MG/3ML IN SOLN
3.0000 mL | Freq: Once | RESPIRATORY_TRACT | Status: AC
  Administered 2018-04-04: 3 mL via RESPIRATORY_TRACT
  Filled 2018-04-04: qty 3

## 2018-04-04 MED ORDER — ALBUTEROL SULFATE (2.5 MG/3ML) 0.083% IN NEBU: 2.5000 mg | INHALATION_SOLUTION | RESPIRATORY_TRACT | Status: DC | PRN

## 2018-04-04 MED ORDER — METHYLPREDNISOLONE SODIUM SUCC 40 MG IJ SOLR
40.0000 mg | Freq: Four times a day (QID) | INTRAMUSCULAR | Status: AC
  Administered 2018-04-04 – 2018-04-05 (×4): 40 mg via INTRAVENOUS
  Filled 2018-04-04 (×4): qty 1

## 2018-04-04 MED ORDER — POTASSIUM CHLORIDE CRYS ER 20 MEQ PO TBCR
40.0000 meq | EXTENDED_RELEASE_TABLET | Freq: Once | ORAL | Status: AC
  Administered 2018-04-04: 40 meq via ORAL
  Filled 2018-04-04: qty 2

## 2018-04-04 MED ORDER — CALCIUM GLUCONATE-NACL 1-0.675 GM/50ML-% IV SOLN
1.0000 g | Freq: Once | INTRAVENOUS | Status: AC
  Administered 2018-04-04: 1000 mg via INTRAVENOUS
  Filled 2018-04-04: qty 50

## 2018-04-04 MED ORDER — ACETAMINOPHEN 650 MG RE SUPP: 650.0000 mg | Freq: Four times a day (QID) | RECTAL | Status: DC | PRN

## 2018-04-04 MED ORDER — PREDNISONE 50 MG PO TABS
60.0000 mg | ORAL_TABLET | Freq: Once | ORAL | Status: AC
  Administered 2018-04-04: 60 mg via ORAL
  Filled 2018-04-04: qty 1

## 2018-04-04 MED ORDER — ACETAMINOPHEN 325 MG PO TABS
650.0000 mg | ORAL_TABLET | Freq: Four times a day (QID) | ORAL | Status: DC | PRN
  Administered 2018-04-04 – 2018-04-05 (×2): 650 mg via ORAL
  Filled 2018-04-04 (×2): qty 2

## 2018-04-04 MED ORDER — ALBUTEROL SULFATE (2.5 MG/3ML) 0.083% IN NEBU
2.5000 mg | INHALATION_SOLUTION | Freq: Once | RESPIRATORY_TRACT | Status: AC
  Administered 2018-04-04: 2.5 mg via RESPIRATORY_TRACT
  Filled 2018-04-04: qty 3

## 2018-04-04 MED ORDER — MUPIROCIN 2 % EX OINT
1.0000 "application " | TOPICAL_OINTMENT | Freq: Two times a day (BID) | CUTANEOUS | Status: AC
  Administered 2018-04-04 – 2018-04-09 (×10): 1 via NASAL
  Filled 2018-04-04 (×3): qty 22

## 2018-04-04 MED ORDER — HEPARIN SODIUM (PORCINE) 5000 UNIT/ML IJ SOLN
5000.0000 [IU] | Freq: Three times a day (TID) | INTRAMUSCULAR | Status: DC
  Administered 2018-04-04 – 2018-04-11 (×21): 5000 [IU] via SUBCUTANEOUS
  Filled 2018-04-04 (×20): qty 1

## 2018-04-04 MED ORDER — CHLORHEXIDINE GLUCONATE CLOTH 2 % EX PADS
6.0000 | MEDICATED_PAD | Freq: Every day | CUTANEOUS | Status: AC
  Administered 2018-04-05 – 2018-04-09 (×5): 6 via TOPICAL

## 2018-04-04 MED ORDER — HEPARIN SODIUM (PORCINE) 5000 UNIT/ML IJ SOLN: 5000.0000 [IU] | Freq: Three times a day (TID) | INTRAMUSCULAR | Status: DC

## 2018-04-04 MED ORDER — PREDNISONE 20 MG PO TABS
40.0000 mg | ORAL_TABLET | Freq: Every day | ORAL | Status: DC
  Administered 2018-04-05: 40 mg via ORAL
  Filled 2018-04-04: qty 2

## 2018-04-04 MED ORDER — ENOXAPARIN SODIUM 80 MG/0.8ML ~~LOC~~ SOLN
0.5000 mg/kg | SUBCUTANEOUS | Status: DC
  Administered 2018-04-04: 65 mg via SUBCUTANEOUS
  Filled 2018-04-04: qty 0.8

## 2018-04-04 MED ORDER — ALBUTEROL (5 MG/ML) CONTINUOUS INHALATION SOLN
10.0000 mg/h | INHALATION_SOLUTION | Freq: Once | RESPIRATORY_TRACT | Status: AC
  Administered 2018-04-04: 10 mg/h via RESPIRATORY_TRACT
  Filled 2018-04-04: qty 20

## 2018-04-04 MED ORDER — SODIUM CHLORIDE 0.9% FLUSH
3.0000 mL | Freq: Two times a day (BID) | INTRAVENOUS | Status: DC
  Administered 2018-04-04 – 2018-04-11 (×15): 3 mL via INTRAVENOUS

## 2018-04-04 MED ORDER — MAGNESIUM SULFATE 2 GM/50ML IV SOLN
2.0000 g | Freq: Once | INTRAVENOUS | Status: AC
  Administered 2018-04-04: 2 g via INTRAVENOUS
  Filled 2018-04-04: qty 50

## 2018-04-04 NOTE — Evaluation (Signed)
Physical Therapy Evaluation Patient Details Name: Ann [name] MRN: [id number] DOB: 09/04/1945 Today's Date: 04/04/2018   History of Present Illness  Ann [name] is a 72 y.o. female with medical history significant of adrenal insufficiency, chronic use of steroids, anxiety, osteoarthritis, CAD, history of stent placement, history of lower extremity cellulitis, chronic back pain, chronic diastolic heart failure, chronic neck pain, chronic renal insufficiency, history of diverticulosis/diverticulitis, type 2 diabetes, diabetic polyneuropathy, history of left lower extremity DVT, erosive gastritis, GERD, glaucoma, hiatal hernia, hyperlipidemia, hypertension, hypokalemia, hypothyroidism, internal hemorrhoids, morbid obesity, rectal polyp, tubular adenoma:, Vitamin B12 deficiency who is coming to the emergency department due to shortness of breath.    Clinical Impression  Patient functioning near baseline for functional mobility and gait, demonstrates good return for sitting up at bedside and transferring to [facility], ambulated in hallway while on room air with O2 saturation above 95% without loss of balance, limited mostly due to SOB, instructed in pursed lip breathing with fair/good carryover and tolerated sitting up in chair after therapy.  Patient left on room air after therapy - RN notified.  Patient will benefit from continued physical therapy in hospital and recommended venue below to increase strength, balance, endurance for safe ADLs and gait.    Follow Up Recommendations Home health PT;Supervision for mobility/OOB    Equipment Recommendations  None recommended by PT    Recommendations for Other Services       Precautions / Restrictions Precautions Precautions: Fall Restrictions Weight Bearing Restrictions: No      Mobility  Bed Mobility Overal bed mobility: Modified Independent                Transfers Overall transfer level: Needs assistance Equipment used: Rolling  walker (2 wheeled) Transfers: Sit to/from [employer] Transfers Sit to Stand: Supervision Stand pivot transfers: Supervision       General transfer comment: slightly labored movement  Ambulation/Gait Ambulation/Gait assistance: Min guard Gait Distance (Feet): 45 Feet Assistive device: Rolling walker (2 wheeled) Gait Pattern/deviations: Decreased step length - right;Decreased step length - left;Decreased stride length Gait velocity: decreased   General Gait Details: slow slightly labored cadence without loss of balance, on room air with O2 saturation above 95%, limited mostly due to SOB  Stairs            Wheelchair Mobility    Modified Rankin (Stroke Patients Only)       Balance Overall balance assessment: Needs assistance Sitting-balance support: Feet supported;No upper extremity supported Sitting balance-Leahy Scale: Good     Standing balance support: During functional activity;Bilateral upper extremity supported Standing balance-Leahy Scale: Fair Standing balance comment: using RW                             Pertinent Vitals/Pain Pain Assessment: No/denies pain    Home Living Family/patient expects to be discharged to:: Private residence Living Arrangements: Spouse/significant other;Children;Other relatives Available Help at Discharge: Family Type of Home: House Home Access: Ramped entrance     Home Layout: Two level;Able to live on main level with bedroom/bathroom Home Equipment: Gilford Rile - 2 wheels;Shower seat;Bedside commode;Hospital bed;Walker - 4 wheels;Walker - standard      Prior Function Level of Independence: Needs assistance   Gait / Transfers Assistance Needed: household ambulator with Rollator  ADL's / Homemaking Assistance Needed: assisted by family        Hand Dominance   Dominant Hand: Right    Extremity/Trunk Assessment  Upper Extremity Assessment Upper Extremity Assessment: Generalized weakness    Lower  Extremity Assessment Lower Extremity Assessment: Generalized weakness    Cervical / Trunk Assessment Cervical / Trunk Assessment: Normal  Communication   Communication: No difficulties  Cognition Arousal/Alertness: Awake/alert Behavior During Therapy: WFL for tasks assessed/performed Overall Cognitive Status: Within Functional Limits for tasks assessed                                        General Comments      Exercises     Assessment/Plan    PT Assessment Patient needs continued PT services  PT Problem List Decreased strength;Decreased activity tolerance;Decreased balance;Decreased mobility       PT Treatment Interventions Gait training;Stair training;Functional mobility training;Therapeutic activities;Therapeutic exercise;Patient/family education    PT Goals (Current goals can be found in the Care Plan section)  Acute Rehab PT Goals Patient Stated Goal: return home PT Goal Formulation: With patient/family Time For Goal Achievement: 04/11/18 Potential to Achieve Goals: Good    Frequency Min 3X/week   Barriers to discharge        Co-evaluation               AM-PAC PT "6 Clicks" Daily Activity  Outcome Measure Difficulty turning over in bed (including adjusting bedclothes, sheets and blankets)?: None Difficulty moving from lying on back to sitting on the side of the bed? : None Difficulty sitting down on and standing up from a chair with arms (e.g., wheelchair, bedside commode, etc,.)?: A Little Help needed moving to and from a bed to chair (including a wheelchair)?: A Little Help needed walking in hospital room?: A Little Help needed climbing 3-5 steps with a railing? : A Little 6 Click Score: 20    End of Session Equipment Utilized During Treatment: Gait belt Activity Tolerance: Patient tolerated treatment well;Patient limited by fatigue Patient left: in chair;with call bell/phone within reach Nurse Communication: Mobility status PT  Visit Diagnosis: Unsteadiness on feet (R26.81);Other abnormalities of gait and mobility (R26.89);Muscle weakness (generalized) (M62.81)    Time: 1030-1058 PT Time Calculation (min) (ACUTE ONLY): 28 min   Charges:   PT Evaluation $PT Eval Moderate Complexity: 1 Mod PT Treatments $Therapeutic Activity: 23-37 mins        1:33 PM, 04/04/18 Lonell Grandchild, MPT Physical Therapist with [facility] [phone] office [phone] mobile phone
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
## 2018-04-04 NOTE — Progress Notes (Signed)
Nutrition Brief Note  Patient identified on the Malnutrition Screening Tool (MST) Report.  72 year old female who presented to the ED on 11/9 with SOB and worsening cough. PMH significant for CAD, COPD, type 2 diabetes mellitus, DVT, CHF, hypertension, and hyperlipidemia.  Upon review of weight history, no evidence of recent weight loss. PO intake 100% since admission.  Wt Readings from Last 15 Encounters:  04/04/18 125.2 kg  12/31/17 122.9 kg  11/12/17 123.8 kg  08/11/17 117.9 kg  06/17/17 122 kg  06/05/17 122.3 kg  03/20/17 120.7 kg  03/14/17 120.2 kg  01/09/17 119.7 kg  12/13/16 121.1 kg  12/03/16 124.3 kg  10/23/16 122.7 kg  09/11/16 125.2 kg  06/27/16 123.9 kg  05/29/16 122 kg    Body mass index is 48.89 kg/m. Patient meets criteria for obesity class III based on current BMI.   Current diet order is Heart Healthy/Carb Modified, patient is consuming approximately 100% of meals at this time. Labs and medications reviewed.   No nutrition interventions warranted at this time. If nutrition issues arise, please consult RD.    Gaynell Face, MS, RD, LDN Inpatient Clinical Dietitian Pager: [phone] Weekend/After Hours: [phone]
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
## 2018-04-04 NOTE — Plan of Care (Signed)
  Problem: Acute Rehab PT Goals(only PT should resolve) Goal: Pt Will Go Supine/Side To Sit Outcome: Progressing Flowsheets (Taken 04/04/2018 1334) Pt will go Supine/Side to Sit: Independently Goal: Patient Will Transfer Sit To/From Stand Outcome: Progressing Flowsheets (Taken 04/04/2018 1334) Patient will transfer sit to/from stand: with modified independence Goal: Pt Will Transfer Bed To Chair/Chair To Bed Outcome: Progressing Flowsheets (Taken 04/04/2018 1334) Pt will Transfer Bed to Chair/Chair to Bed: with modified independence Goal: Pt Will Ambulate Outcome: Progressing Flowsheets (Taken 04/04/2018 1334) Pt will Ambulate: 75 feet; with supervision; with rolling walker   1:35 PM, 04/04/18 Lonell Grandchild, MPT Physical Therapist with [facility] [phone] office [phone] mobile phone

## 2018-04-04 NOTE — Progress Notes (Signed)
This is an assumption of care note.  She says she feels much better since she is been in the hospital.  Her breathing is better.  She does not feel as short of breath.  She is not coughing as much.  Exam shows an obese female who is in no acute distress.  She has on nasal oxygen.  Her chest shows diminished breath sounds but no wheezes or rales or rhonchi now.  Heart is regular.  Assessment: She has COPD exacerbation.  I think she is improved already.  Continue treatments.  Probable discharge in the next 48 hours

## 2018-04-04 NOTE — ED Triage Notes (Signed)
Pt c/o sob and productive cough for the last couple of days that has gotten progressively worse

## 2018-04-05 DIAGNOSIS — M549 Dorsalgia, unspecified: Secondary | ICD-10-CM | POA: Diagnosis present

## 2018-04-05 DIAGNOSIS — J441 Chronic obstructive pulmonary disease with (acute) exacerbation: Secondary | ICD-10-CM | POA: Diagnosis present

## 2018-04-05 DIAGNOSIS — E669 Obesity, unspecified: Secondary | ICD-10-CM | POA: Diagnosis not present

## 2018-04-05 DIAGNOSIS — E1140 Type 2 diabetes mellitus with diabetic neuropathy, unspecified: Secondary | ICD-10-CM | POA: Diagnosis not present

## 2018-04-05 DIAGNOSIS — I130 Hypertensive heart and chronic kidney disease with heart failure and stage 1 through stage 4 chronic kidney disease, or unspecified chronic kidney disease: Secondary | ICD-10-CM | POA: Diagnosis present

## 2018-04-05 DIAGNOSIS — I480 Paroxysmal atrial fibrillation: Secondary | ICD-10-CM | POA: Diagnosis present

## 2018-04-05 DIAGNOSIS — H409 Unspecified glaucoma: Secondary | ICD-10-CM | POA: Diagnosis present

## 2018-04-05 DIAGNOSIS — M1990 Unspecified osteoarthritis, unspecified site: Secondary | ICD-10-CM | POA: Diagnosis present

## 2018-04-05 DIAGNOSIS — Z9842 Cataract extraction status, left eye: Secondary | ICD-10-CM | POA: Diagnosis not present

## 2018-04-05 DIAGNOSIS — E2740 Unspecified adrenocortical insufficiency: Secondary | ICD-10-CM | POA: Diagnosis present

## 2018-04-05 DIAGNOSIS — J9621 Acute and chronic respiratory failure with hypoxia: Secondary | ICD-10-CM | POA: Diagnosis present

## 2018-04-05 DIAGNOSIS — E1142 Type 2 diabetes mellitus with diabetic polyneuropathy: Secondary | ICD-10-CM | POA: Diagnosis present

## 2018-04-05 DIAGNOSIS — I2510 Atherosclerotic heart disease of native coronary artery without angina pectoris: Secondary | ICD-10-CM | POA: Diagnosis present

## 2018-04-05 DIAGNOSIS — E1122 Type 2 diabetes mellitus with diabetic chronic kidney disease: Secondary | ICD-10-CM | POA: Diagnosis present

## 2018-04-05 DIAGNOSIS — Z6841 Body Mass Index (BMI) 40.0 and over, adult: Secondary | ICD-10-CM | POA: Diagnosis not present

## 2018-04-05 DIAGNOSIS — Z961 Presence of intraocular lens: Secondary | ICD-10-CM | POA: Diagnosis present

## 2018-04-05 DIAGNOSIS — G8929 Other chronic pain: Secondary | ICD-10-CM | POA: Diagnosis present

## 2018-04-05 DIAGNOSIS — J449 Chronic obstructive pulmonary disease, unspecified: Secondary | ICD-10-CM | POA: Diagnosis not present

## 2018-04-05 DIAGNOSIS — M542 Cervicalgia: Secondary | ICD-10-CM | POA: Diagnosis present

## 2018-04-05 DIAGNOSIS — E538 Deficiency of other specified B group vitamins: Secondary | ICD-10-CM | POA: Diagnosis present

## 2018-04-05 DIAGNOSIS — Z9841 Cataract extraction status, right eye: Secondary | ICD-10-CM | POA: Diagnosis not present

## 2018-04-05 DIAGNOSIS — E785 Hyperlipidemia, unspecified: Secondary | ICD-10-CM | POA: Diagnosis present

## 2018-04-05 DIAGNOSIS — E039 Hypothyroidism, unspecified: Secondary | ICD-10-CM | POA: Diagnosis present

## 2018-04-05 DIAGNOSIS — J9601 Acute respiratory failure with hypoxia: Secondary | ICD-10-CM | POA: Diagnosis not present

## 2018-04-05 DIAGNOSIS — R0602 Shortness of breath: Secondary | ICD-10-CM | POA: Diagnosis not present

## 2018-04-05 DIAGNOSIS — I5032 Chronic diastolic (congestive) heart failure: Secondary | ICD-10-CM | POA: Diagnosis present

## 2018-04-05 DIAGNOSIS — K219 Gastro-esophageal reflux disease without esophagitis: Secondary | ICD-10-CM | POA: Diagnosis present

## 2018-04-05 DIAGNOSIS — N189 Chronic kidney disease, unspecified: Secondary | ICD-10-CM | POA: Diagnosis present

## 2018-04-05 LAB — COMPREHENSIVE METABOLIC PANEL
ALK PHOS: 70 U/L (ref 38–126)
ALT: 20 U/L (ref 0–44)
AST: 21 U/L (ref 15–41)
Albumin: 3.6 g/dL (ref 3.5–5.0)
Anion gap: 11 (ref 5–15)
BUN: 20 mg/dL (ref 8–23)
CALCIUM: 9 mg/dL (ref 8.9–10.3)
CO2: 22 mmol/L (ref 22–32)
Chloride: 105 mmol/L (ref 98–111)
Creatinine, Ser: 0.7 mg/dL (ref 0.44–1.00)
Glucose, Bld: 232 mg/dL — ABNORMAL HIGH (ref 70–99)
Potassium: 4.2 mmol/L (ref 3.5–5.1)
Sodium: 138 mmol/L (ref 135–145)
Total Bilirubin: 0.7 mg/dL (ref 0.3–1.2)
Total Protein: 7 g/dL (ref 6.5–8.1)

## 2018-04-05 LAB — CBC
HEMATOCRIT: 38.3 % (ref 36.0–46.0)
HEMOGLOBIN: 11.4 g/dL — AB (ref 12.0–15.0)
MCH: 24.7 pg — ABNORMAL LOW (ref 26.0–34.0)
MCHC: 29.8 g/dL — ABNORMAL LOW (ref 30.0–36.0)
MCV: 83.1 fL (ref 80.0–100.0)
NRBC: 0 % (ref 0.0–0.2)
Platelets: 238 10*3/uL (ref 150–400)
RBC: 4.61 MIL/uL (ref 3.87–5.11)
RDW: 16.2 % — ABNORMAL HIGH (ref 11.5–15.5)
WBC: 11.8 10*3/uL — ABNORMAL HIGH (ref 4.0–10.5)

## 2018-04-05 LAB — GLUCOSE, CAPILLARY
GLUCOSE-CAPILLARY: 321 mg/dL — AB (ref 70–99)
Glucose-Capillary: 300 mg/dL — ABNORMAL HIGH (ref 70–99)
Glucose-Capillary: 187 mg/dL — ABNORMAL HIGH (ref 70–99)

## 2018-04-05 LAB — MAGNESIUM: Magnesium: 2.1 mg/dL (ref 1.7–2.4)

## 2018-04-05 MED ORDER — CARVEDILOL 3.125 MG PO TABS
6.2500 mg | ORAL_TABLET | Freq: Two times a day (BID) | ORAL | Status: DC
  Administered 2018-04-05 – 2018-04-11 (×12): 6.25 mg via ORAL
  Filled 2018-04-05 (×12): qty 2

## 2018-04-05 MED ORDER — VITAMIN B-12 1000 MCG PO TABS
1000.0000 ug | ORAL_TABLET | Freq: Every day | ORAL | Status: DC
  Administered 2018-04-05 – 2018-04-11 (×7): 1000 ug via ORAL
  Filled 2018-04-05 (×7): qty 1

## 2018-04-05 MED ORDER — LINACLOTIDE 72 MCG PO CAPS
72.0000 ug | ORAL_CAPSULE | Freq: Every day | ORAL | Status: DC
  Administered 2018-04-06 – 2018-04-10 (×4): 72 ug via ORAL
  Filled 2018-04-05 (×9): qty 1

## 2018-04-05 MED ORDER — LEVOTHYROXINE SODIUM 100 MCG PO TABS
200.0000 ug | ORAL_TABLET | Freq: Every day | ORAL | Status: DC
  Administered 2018-04-06 – 2018-04-11 (×6): 200 ug via ORAL
  Filled 2018-04-05 (×6): qty 2

## 2018-04-05 MED ORDER — AMLODIPINE BESYLATE 5 MG PO TABS
5.0000 mg | ORAL_TABLET | Freq: Every day | ORAL | Status: DC
  Administered 2018-04-05 – 2018-04-11 (×7): 5 mg via ORAL
  Filled 2018-04-05 (×7): qty 1

## 2018-04-05 MED ORDER — NITROGLYCERIN 0.4 MG SL SUBL: 0.4000 mg | SUBLINGUAL_TABLET | SUBLINGUAL | Status: DC | PRN

## 2018-04-05 MED ORDER — IPRATROPIUM-ALBUTEROL 0.5-2.5 (3) MG/3ML IN SOLN
3.0000 mL | Freq: Four times a day (QID) | RESPIRATORY_TRACT | Status: DC
  Administered 2018-04-05 – 2018-04-10 (×21): 3 mL via RESPIRATORY_TRACT
  Filled 2018-04-05 (×21): qty 3

## 2018-04-05 MED ORDER — PANTOPRAZOLE SODIUM 40 MG PO TBEC
40.0000 mg | DELAYED_RELEASE_TABLET | Freq: Every day | ORAL | Status: DC
  Administered 2018-04-06 – 2018-04-11 (×6): 40 mg via ORAL
  Filled 2018-04-05 (×6): qty 1

## 2018-04-05 MED ORDER — SODIUM CHLORIDE 0.9 % IV SOLN
1.0000 g | INTRAVENOUS | Status: DC
  Administered 2018-04-05 – 2018-04-09 (×5): 1 g via INTRAVENOUS
  Filled 2018-04-05: qty 10
  Filled 2018-04-05: qty 1
  Filled 2018-04-05 (×4): qty 10
  Filled 2018-04-05: qty 1
  Filled 2018-04-05 (×2): qty 10
  Filled 2018-04-05: qty 1

## 2018-04-05 MED ORDER — FLUTICASONE FUROATE-VILANTEROL 200-25 MCG/INH IN AEPB
1.0000 | INHALATION_SPRAY | Freq: Every day | RESPIRATORY_TRACT | Status: DC
  Administered 2018-04-05 – 2018-04-11 (×7): 1 via RESPIRATORY_TRACT
  Filled 2018-04-05: qty 28

## 2018-04-05 MED ORDER — SERTRALINE HCL 50 MG PO TABS
50.0000 mg | ORAL_TABLET | Freq: Every day | ORAL | Status: DC
  Administered 2018-04-05 – 2018-04-10 (×6): 50 mg via ORAL
  Filled 2018-04-05 (×6): qty 1

## 2018-04-05 MED ORDER — IPRATROPIUM BROMIDE 0.02 % IN SOLN
RESPIRATORY_TRACT | Status: AC
  Administered 2018-04-05: 0.5 mg
  Filled 2018-04-05: qty 2.5

## 2018-04-05 MED ORDER — LORATADINE 10 MG PO TABS
10.0000 mg | ORAL_TABLET | Freq: Every day | ORAL | Status: DC
  Administered 2018-04-05 – 2018-04-11 (×7): 10 mg via ORAL
  Filled 2018-04-05 (×7): qty 1

## 2018-04-05 MED ORDER — GUAIFENESIN ER 600 MG PO TB12
1200.0000 mg | ORAL_TABLET | Freq: Two times a day (BID) | ORAL | Status: DC
  Administered 2018-04-05 – 2018-04-11 (×13): 1200 mg via ORAL
  Filled 2018-04-05 (×13): qty 2

## 2018-04-05 MED ORDER — DOCUSATE SODIUM 100 MG PO CAPS
100.0000 mg | ORAL_CAPSULE | Freq: Two times a day (BID) | ORAL | Status: DC
  Administered 2018-04-05 – 2018-04-11 (×13): 100 mg via ORAL
  Filled 2018-04-05 (×14): qty 1

## 2018-04-05 MED ORDER — INSULIN ASPART 100 UNIT/ML ~~LOC~~ SOLN
0.0000 [IU] | Freq: Three times a day (TID) | SUBCUTANEOUS | Status: DC
  Administered 2018-04-05: 11 [IU] via SUBCUTANEOUS
  Administered 2018-04-05: 15 [IU] via SUBCUTANEOUS
  Administered 2018-04-06 (×3): 3 [IU] via SUBCUTANEOUS
  Administered 2018-04-07 (×2): 7 [IU] via SUBCUTANEOUS
  Administered 2018-04-07 – 2018-04-08 (×2): 4 [IU] via SUBCUTANEOUS
  Administered 2018-04-08: 11 [IU] via SUBCUTANEOUS
  Administered 2018-04-08: 7 [IU] via SUBCUTANEOUS
  Administered 2018-04-09: 4 [IU] via SUBCUTANEOUS
  Administered 2018-04-09 – 2018-04-10 (×2): 11 [IU] via SUBCUTANEOUS
  Administered 2018-04-10: 4 [IU] via SUBCUTANEOUS
  Administered 2018-04-10 – 2018-04-11 (×2): 3 [IU] via SUBCUTANEOUS

## 2018-04-05 MED ORDER — GABAPENTIN 400 MG PO CAPS
400.0000 mg | ORAL_CAPSULE | Freq: Three times a day (TID) | ORAL | Status: DC
  Administered 2018-04-05 – 2018-04-11 (×19): 400 mg via ORAL
  Filled 2018-04-05 (×19): qty 1

## 2018-04-05 MED ORDER — INSULIN GLARGINE 100 UNIT/ML ~~LOC~~ SOLN
10.0000 [IU] | Freq: Every day | SUBCUTANEOUS | Status: DC
  Administered 2018-04-05 – 2018-04-07 (×3): 10 [IU] via SUBCUTANEOUS
  Filled 2018-04-05 (×4): qty 0.1

## 2018-04-05 MED ORDER — DILTIAZEM HCL 60 MG PO TABS: 120.0000 mg | ORAL_TABLET | Freq: Every day | ORAL | Status: DC

## 2018-04-05 MED ORDER — ALBUTEROL SULFATE (2.5 MG/3ML) 0.083% IN NEBU
INHALATION_SOLUTION | RESPIRATORY_TRACT | Status: AC
  Administered 2018-04-05: 2.5 mg
  Filled 2018-04-05: qty 3

## 2018-04-05 MED ORDER — ATORVASTATIN CALCIUM 40 MG PO TABS
80.0000 mg | ORAL_TABLET | Freq: Every day | ORAL | Status: DC
  Administered 2018-04-05 – 2018-04-10 (×6): 80 mg via ORAL
  Filled 2018-04-05 (×6): qty 2

## 2018-04-05 MED ORDER — ALPRAZOLAM 0.5 MG PO TABS
0.5000 mg | ORAL_TABLET | Freq: Two times a day (BID) | ORAL | Status: DC
  Administered 2018-04-05 – 2018-04-11 (×13): 0.5 mg via ORAL
  Filled 2018-04-05 (×13): qty 1

## 2018-04-05 MED ORDER — MOMETASONE FURO-FORMOTEROL FUM 200-5 MCG/ACT IN AERO
2.0000 | INHALATION_SPRAY | Freq: Two times a day (BID) | RESPIRATORY_TRACT | Status: DC
  Filled 2018-04-05: qty 8.8

## 2018-04-05 MED ORDER — INSULIN ASPART 100 UNIT/ML ~~LOC~~ SOLN
0.0000 [IU] | Freq: Every day | SUBCUTANEOUS | Status: DC
  Administered 2018-04-07: 4 [IU] via SUBCUTANEOUS
  Administered 2018-04-08: 3 [IU] via SUBCUTANEOUS
  Administered 2018-04-09 – 2018-04-10 (×2): 2 [IU] via SUBCUTANEOUS

## 2018-04-05 NOTE — Progress Notes (Signed)
Subjective: She is having more trouble today.  She has been coughing and congested.  No other new complaints.  Her breathing is definitely worse.  She is coughing up yellowish sputum.  Objective: Vital signs in last 24 hours: Temp:  [97.8 F (36.6 C)-98 F (36.7 C)] 98 F (36.7 C) (11/10 0817) Pulse Rate:  [55-89] 82 (11/10 0817) Resp:  [13-31] 31 (11/10 0817) BP: (150-165)/(71-106) 150/71 (11/10 0400) SpO2:  [92 %-99 %] 99 % (11/10 0921) Weight:  [124 kg] 124 kg (11/10 0600) Weight change: -1.193 kg Last BM Date: 04/03/18  Intake/Output from previous day: 11/09 0701 - 11/10 0700 In: 1080 [P.O.:1080] Out: 1000 [Urine:1000]  PHYSICAL EXAM General appearance: alert, cooperative and morbidly obese Resp: rhonchi bilaterally and wheezes bilaterally Cardio: regular rate and rhythm, S1, S2 normal, no murmur, click, rub or gallop GI: soft, non-tender; bowel sounds normal; no masses,  no organomegaly Extremities: extremities normal, atraumatic, no cyanosis or edema  Lab Results:  Results for orders placed or performed during the hospital encounter of 04/04/18 (from the past 48 hour(s))  Basic metabolic panel     Status: Abnormal   Collection Time: 04/04/18  3:21 AM  Result Value Ref Range   Sodium 140 135 - 145 mmol/L   Potassium 3.5 3.5 - 5.1 mmol/L   Chloride 107 98 - 111 mmol/L   CO2 24 22 - 32 mmol/L   Glucose, Bld 141 (H) 70 - 99 mg/dL   BUN 13 8 - 23 mg/dL   Creatinine, Ser 0.80 0.44 - 1.00 mg/dL   Calcium 8.9 8.9 - 10.3 mg/dL   GFR calc non Af Amer >60 >60 mL/min   GFR calc Af Amer >60 >60 mL/min    Comment: (NOTE) The eGFR has been calculated using the CKD EPI equation. This calculation has not been validated in all clinical situations. eGFR's persistently <60 mL/min signify possible Chronic Kidney Disease.    Anion gap 9 5 - 15    Comment: Performed at [facility], [address], [place], [place] [address]  CBC with Differential/Platelet     Status: Abnormal   Collection Time: 04/04/18  3:21 AM  Result Value Ref Range   WBC 7.6 4.0 - 10.5 K/uL   RBC 4.49 3.87 - 5.11 MIL/uL   Hemoglobin 11.2 (L) 12.0 - 15.0 g/dL   HCT 38.3 36.0 - 46.0 %   MCV 85.3 80.0 - 100.0 fL   MCH 24.9 (L) 26.0 - 34.0 pg   MCHC 29.2 (L) 30.0 - 36.0 g/dL   RDW 16.5 (H) 11.5 - 15.5 %   Platelets 211 150 - 400 K/uL   nRBC 0.0 0.0 - 0.2 %   Neutrophils Relative % 51 %   Neutro Abs 3.8 1.7 - 7.7 K/uL   Lymphocytes Relative 33 %   Lymphs Abs 2.5 0.7 - 4.0 K/uL   Monocytes Relative 11 %   Monocytes Absolute 0.8 0.1 - 1.0 K/uL   Eosinophils Relative 5 %   Eosinophils Absolute 0.4 0.0 - 0.5 K/uL   Basophils Relative 0 %   Basophils Absolute 0.0 0.0 - 0.1 K/uL   Immature Granulocytes 0 %   Abs Immature Granulocytes 0.02 0.00 - 0.07 K/uL    Comment: Performed at [facility], [address], [place], [place] [address]  Magnesium     Status: Abnormal   Collection Time: 04/04/18  3:21 AM  Result Value Ref Range   Magnesium 1.6 (L) 1.7 - 2.4 mg/dL    Comment: Performed  at [facility], [address] [place], [place] [address]  Phosphorus     Status: None   Collection Time: 04/04/18  3:21 AM  Result Value Ref Range   Phosphorus 2.8 2.5 - 4.6 mg/dL    Comment: Performed at [facility], [address], [place], [place] [address]  Hepatic function panel     Status: None   Collection Time: 04/04/18  3:21 AM  Result Value Ref Range   Total Protein 6.8 6.5 - 8.1 g/dL   Albumin 3.6 3.5 - 5.0 g/dL   AST 24 15 - 41 U/L   ALT 20 0 - 44 U/L   Alkaline Phosphatase 86 38 - 126 U/L   Total Bilirubin 0.5 0.3 - 1.2 mg/dL   Bilirubin, Direct 0.1 0.0 - 0.2 mg/dL   Indirect Bilirubin 0.4 0.3 - 0.9 mg/dL    Comment: Performed at [facility], [address] [place], [place] [address]  MRSA PCR Screening     Status: Abnormal   Collection Time: 04/04/18  6:10 AM  Result Value Ref Range   MRSA by PCR POSITIVE (A) NEGATIVE    Comment:        The GeneXpert MRSA Assay (FDA approved for  NASAL specimens only), is one component of a comprehensive MRSA colonization surveillance program. It is not intended to diagnose MRSA infection nor to guide or monitor treatment for MRSA infections. RESULT CALLED TO, READ BACK BY AND VERIFIED WITH: MURPHY,E. AT 1756 ON 04/04/2018 BY EVA Performed at [facility], [address] [place], [place] [address]   Comprehensive metabolic panel     Status: Abnormal   Collection Time: 04/05/18  4:21 AM  Result Value Ref Range   Sodium 138 135 - 145 mmol/L   Potassium 4.2 3.5 - 5.1 mmol/L   Chloride 105 98 - 111 mmol/L   CO2 22 22 - 32 mmol/L   Glucose, Bld 232 (H) 70 - 99 mg/dL   BUN 20 8 - 23 mg/dL   Creatinine, Ser 0.70 0.44 - 1.00 mg/dL   Calcium 9.0 8.9 - 10.3 mg/dL   Total Protein 7.0 6.5 - 8.1 g/dL   Albumin 3.6 3.5 - 5.0 g/dL   AST 21 15 - 41 U/L   ALT 20 0 - 44 U/L   Alkaline Phosphatase 70 38 - 126 U/L   Total Bilirubin 0.7 0.3 - 1.2 mg/dL   GFR calc non Af Amer >60 >60 mL/min   GFR calc Af Amer >60 >60 mL/min    Comment: (NOTE) The eGFR has been calculated using the CKD EPI equation. This calculation has not been validated in all clinical situations. eGFR's persistently <60 mL/min signify possible Chronic Kidney Disease.    Anion gap 11 5 - 15    Comment: Performed at [facility], [address], [place],  [address]  CBC     Status: Abnormal   Collection Time: 04/05/18  4:21 AM  Result Value Ref Range   WBC 11.8 (H) 4.0 - 10.5 K/uL   RBC 4.61 3.87 - 5.11 MIL/uL   Hemoglobin 11.4 (L) 12.0 - 15.0 g/dL   HCT 38.3 36.0 - 46.0 %   MCV 83.1 80.0 - 100.0 fL   MCH 24.7 (L) 26.0 - 34.0 pg   MCHC 29.8 (L) 30.0 - 36.0 g/dL   RDW 16.2 (H) 11.5 - 15.5 %   Platelets 238 150 - 400 K/uL   nRBC 0.0 0.0 - 0.2 %    Comment: Performed at [facility], [address], [place], [place] [address]  Magnesium     Status: None   Collection Time: 04/05/18  4:21 AM  Result Value Ref Range   Magnesium 2.1 1.7 - 2.4 mg/dL     Comment: Performed at [facility], [address] [place], [place] [address]    ABGS No results for input(s): PHART, PO2ART, TCO2, HCO3 in the last 72 hours.  Invalid input(s): PCO2 CULTURES Recent Results (from the past 240 hour(s))  MRSA PCR Screening     Status: Abnormal   Collection Time: 04/04/18  6:10 AM  Result Value Ref Range Status   MRSA by PCR POSITIVE (A) NEGATIVE Final    Comment:        The GeneXpert MRSA Assay (FDA approved for NASAL specimens only), is one component of a comprehensive MRSA colonization surveillance program. It is not intended to diagnose MRSA infection nor to guide or monitor treatment for MRSA infections. RESULT CALLED TO, READ BACK BY AND VERIFIED WITH: MURPHY,E. AT 1756 ON 04/04/2018 BY EVA Performed at [facility], [address], [place], [place] [address]    Studies/Results: Dg Chest Port 1 View  Result Date: 04/04/2018 CLINICAL DATA:  Shortness of breath and productive cough for the last few days. EXAM: PORTABLE CHEST 1 VIEW COMPARISON:  07/02/2017 FINDINGS: Limited examination due to body habitus, underpenetration and AP projection. Heart is mildly enlarged but stable. There is tortuosity and calcification of the thoracic aorta. The power port is stable. Streaky scarring changes but no obvious infiltrates, edema or effusions. IMPRESSION: Limited examination. Streaky basilar atelectasis but no definite infiltrates or effusions. Electronically Signed   By: Marijo Sanes M.D.   On: 04/04/2018 02:52    Medications:  Prior to Admission:  Medications Prior to Admission  Medication Sig Dispense Refill Last Dose  . albuterol (PROVENTIL HFA;VENTOLIN HFA) 108 (90 BASE) MCG/ACT inhaler Inhale 2 puffs into the lungs every 4 (four) hours as needed for wheezing.   04/03/2018 at Unknown time  . ALPRAZolam (XANAX) 0.5 MG tablet Take 0.5 mg by mouth 2 (two) times daily.   04/03/2018 at Unknown time  . amLODipine (NORVASC) 5 MG tablet Take 1 tablet (5 mg  total) by mouth daily. 30 tablet 3 04/03/2018 at Unknown time  . atorvastatin (LIPITOR) 80 MG tablet Take 1 tablet (80 mg total) by mouth daily at 6 PM. 30 tablet 12 04/03/2018 at Unknown time  . carvedilol (COREG) 6.25 MG tablet Take 6.25 mg by mouth 2 (two) times daily with a meal.   04/03/2018 at Unknown time  . Cholecalciferol (VITAMIN D3) 5000 units CAPS Take 1 capsule (5,000 Units total) by mouth daily. 90 capsule 0 04/03/2018 at Unknown time  . diltiazem (CARDIZEM) 120 MG tablet Take 120 mg by mouth daily.   04/03/2018 at Unknown time  . docusate sodium (COLACE) 50 MG capsule Take 100 mg by mouth 2 (two) times daily.   04/03/2018 at Unknown time  . Fluticasone-Salmeterol (ADVAIR) 250-50 MCG/DOSE AEPB Inhale 1 puff into the lungs 2 (two) times daily.   04/03/2018 at Unknown time  . gabapentin (NEURONTIN) 400 MG capsule Take 400 mg by mouth 3 (three) times daily.   04/03/2018 at Unknown time  . hydrALAZINE (APRESOLINE) 50 MG tablet Take 1 tablet (50 mg total) by mouth 3 (three) times daily. 120 tablet 12 04/03/2018 at Unknown time  . Insulin Detemir (LEVEMIR FLEXTOUCH) 100 UNIT/ML Pen Inject 60 Units into the skin at bedtime. 15 pen 2 04/03/2018 at Unknown  time  . Insulin Pen Needle (B-D ULTRAFINE III SHORT PEN) 31G X 8 MM MISC Use  1 daily at bedtime to reject insulin. 100 each 1 04/03/2018 at Unknown time  . Lancets MISC Used 2 daily 100 each 3 04/03/2018 at Unknown time  . levothyroxine (SYNTHROID, LEVOTHROID) 200 MCG tablet Take 200 mcg by mouth daily before breakfast.    04/03/2018 at Unknown time  . linaclotide (LINZESS) 72 MCG capsule Take 1 capsule (72 mcg total) by mouth daily before breakfast. 30 capsule 11 04/03/2018 at Unknown time  . loratadine (CLARITIN) 10 MG tablet Take 10 mg by mouth daily.   04/03/2018 at Unknown time  . metFORMIN (GLUMETZA) 500 MG (MOD) 24 hr tablet Take 1 tablet (500 mg total) by mouth 2 (two) times daily with a meal. 180 tablet 1 04/03/2018 at Unknown time  . mirabegron ER  (MYRBETRIQ) 50 MG TB24 tablet Take 50 mg by mouth daily.   04/03/2018 at Unknown time  . pantoprazole (PROTONIX) 40 MG tablet 1 PO 30 MINUTES PRIOR TO MEALS BID 60 tablet 11 04/03/2018 at Unknown time  . predniSONE (DELTASONE) 10 MG tablet Take 10 mg by mouth daily with breakfast.   04/03/2018 at Unknown time  . sertraline (ZOLOFT) 100 MG tablet Take 0.5 tablets (50 mg total) by mouth at bedtime. 30 tablet 11 04/03/2018 at Unknown time  . solifenacin (VESICARE) 5 MG tablet Take 10 mg by mouth daily.    04/03/2018 at Unknown time  . traMADol (ULTRAM) 50 MG tablet Take by mouth every 6 (six) hours as needed.   Past Month at Unknown time  . vitamin B-12 1000 MCG tablet Take 1 tablet (1,000 mcg total) by mouth daily.   04/03/2018 at Unknown time  . carbamide peroxide (DEBROX) 6.5 % OTIC solution as needed.      . Carboxymethylcellulose Sod PF (REFRESH PLUS) 0.5 % SOLN Place 1 drop into both eyes as needed.    unknown  . HYDROcodone-acetaminophen (NORCO/VICODIN) 5-325 MG tablet Take by mouth.   unklnown  . levofloxacin (LEVAQUIN) 500 MG tablet TK 1 T PO QD  0 Completed Course at Unknown time  . methylPREDNISolone (MEDROL DOSEPAK) 4 MG TBPK tablet FPD  0 Completed Course at Unknown time  . nitroGLYCERIN (NITROSTAT) 0.4 MG SL tablet Place 1 tablet (0.4 mg total) under the tongue every 5 (five) minutes as needed for chest pain.  12 unknown  . ondansetron (ZOFRAN) 4 MG tablet Take 1 tablet (4 mg total) by mouth every 6 (six) hours as needed for nausea or vomiting. 12 tablet 0 unknown   Scheduled: . ALPRAZolam  0.5 mg Oral BID  . amLODipine  5 mg Oral Daily  . atorvastatin  80 mg Oral q1800  . carvedilol  6.25 mg Oral BID WC  . Chlorhexidine Gluconate Cloth  6 each Topical Q0600  . docusate sodium  100 mg Oral BID  . fluticasone furoate-vilanterol  1 puff Inhalation Daily  . gabapentin  400 mg Oral TID  . guaiFENesin  1,200 mg Oral BID  . heparin injection (subcutaneous)  5,000 Units Subcutaneous Q8H  .  insulin aspart  0-20 Units Subcutaneous TID WC  . insulin aspart  0-5 Units Subcutaneous QHS  . insulin glargine  10 Units Subcutaneous QHS  . ipratropium-albuterol  3 mL Nebulization Q6H  . [START ON 04/06/2018] levothyroxine  200 mcg Oral QAC breakfast  . [START ON 04/06/2018] linaclotide  72 mcg Oral QAC breakfast  . loratadine  10 mg Oral  Daily  . mupirocin ointment  1 application Nasal BID  . [START ON 04/06/2018] pantoprazole  40 mg Oral QAC breakfast  . predniSONE  40 mg Oral Q breakfast  . sertraline  50 mg Oral QHS  . sodium chloride flush  3 mL Intravenous Q12H  . cyanocobalamin  1,000 mcg Oral Daily   Continuous: . cefTRIAXone (ROCEPHIN)  IV     YBF:XOVANVBTYOMAY **OR** acetaminophen, albuterol, nitroGLYCERIN  Assesment: She was admitted with COPD exacerbation.  She is having more trouble so I am going to go ahead and have her admitted and started on IV antibiotics.  Her home medications have not been reordered so I have done that.  She has diabetes and will be on sliding scale  She has prolonged QT interval which complicates her treatment  She has chronic adrenal insufficiency which is stable Principal Problem:   COPD exacerbation (HCC) Active Problems:   Acquired hypothyroidism   Essential hypertension   Coronary atherosclerosis   Chronic diastolic heart failure (HCC)   GERD (gastroesophageal reflux disease)   Diabetes mellitus, type II, insulin dependent (HCC)   PAF (paroxysmal atrial fibrillation) (HCC)   Prolonged QT interval    Plan: Continue treatments.  Restart home medications.  Start antibiotics.  Continue steroids.  Start sliding scale.    LOS: 0 days   Donyae Kilner L 04/05/2018, 10:22 AM

## 2018-04-05 NOTE — Progress Notes (Signed)
Patients nebs and mdi have been ordered. ER orders had automatically stopped. She appears better still has wheezes. Off oxygen.

## 2018-04-05 NOTE — Progress Notes (Signed)
Wheezes upper usually start with neb. Mostly diminished otherwise. Patient on room air

## 2018-04-06 LAB — GLUCOSE, CAPILLARY
GLUCOSE-CAPILLARY: 128 mg/dL — AB (ref 70–99)
Glucose-Capillary: 178 mg/dL — ABNORMAL HIGH (ref 70–99)
Glucose-Capillary: 125 mg/dL — ABNORMAL HIGH (ref 70–99)
Glucose-Capillary: 200 mg/dL — ABNORMAL HIGH (ref 70–99)

## 2018-04-06 NOTE — Plan of Care (Signed)

## 2018-04-06 NOTE — Care Management Note (Addendum)
Case Management Note  Patient Details  Name: Ann [name] MRN: [id number] Date of Birth: 1945-07-23  Subjective/Objective:      COPD. On room air. From home with family. Walks with RW. Has PCP. Recommended for home health . Agreeable and elects [facility].        Action/Plan: DC home with home health. Vaughan Basta of [facility] notified and will obtain orders via Epic when available.   Expected Discharge Date:  04/06/18               Expected Discharge Plan:  [place]  In-House Referral:     Discharge planning Services  CM Consult  Post Acute Care Choice:  Home Health Choice offered to:  Patient  DME Arranged:    DME Agency:     HH Arranged:  PT [place]:  [place]  Status of Service:  Completed, signed off  If discussed at [place] of Stay Meetings, dates discussed:    Additional Comments:  Hedi Barkan, Chauncey [place], RN 04/06/2018, 11:29 AM

## 2018-04-06 NOTE — Progress Notes (Signed)
OT Screen Note  Patient Details Name: Ann [name] MRN: [id number] DOB: June 17, 1945   Cancelled Treatment:    Reason Eval/Treat Not Completed: OT screened, no needs identified, will sign off. Patient functioning at baseline at Modified independent and Supervision. Lives with family and receives assistance for daily tasks at needed. No concerns for returning home at discharge. No further OT needs at this time.      Ailene Ravel, OTR/L,CBIS  [phone]  04/06/2018, 8:31 AM

## 2018-04-06 NOTE — Progress Notes (Signed)
Subjective: She says she is feeling better.  She has less shortness of breath.  She still has some wheezing.  Blood sugar not fully controlled.  I may need to adjust her long-acting insulin but I wanted to see where she was before I put her back on very high doses that she takes at home.  She is coughing a little bit but not much.  She is been off oxygen and tolerating that okay  Objective: Vital signs in last 24 hours: Temp:  [97 F (36.1 C)-98 F (36.7 C)] 97 F (36.1 C) (11/11 0400) Pulse Rate:  [43-88] 51 (11/11 0600) Resp:  [16-31] 18 (11/11 0600) BP: (113-155)/(60-103) 135/75 (11/11 0400) SpO2:  [88 %-100 %] 92 % (11/11 0600) Weight change:  Last BM Date: 04/03/18  Intake/Output from previous day: 11/10 0701 - 11/11 0700 In: 943 [P.O.:840; I.V.:3; IV Piggyback:100] Out: 1520 [Urine:1520]  PHYSICAL EXAM General appearance: alert, cooperative and no distress Resp: She still has end expiratory wheezes both anteriorly and posteriorly Cardio: regular rate and rhythm, S1, S2 normal, no murmur, click, rub or gallop GI: soft, non-tender; bowel sounds normal; no masses,  no organomegaly Extremities: extremities normal, atraumatic, no cyanosis or edema  Lab Results:  Results for orders placed or performed during the hospital encounter of 04/04/18 (from the past 48 hour(s))  Comprehensive metabolic panel     Status: Abnormal   Collection Time: 04/05/18  4:21 AM  Result Value Ref Range   Sodium 138 135 - 145 mmol/L   Potassium 4.2 3.5 - 5.1 mmol/L   Chloride 105 98 - 111 mmol/L   CO2 22 22 - 32 mmol/L   Glucose, Bld 232 (H) 70 - 99 mg/dL   BUN 20 8 - 23 mg/dL   Creatinine, Ser 0.70 0.44 - 1.00 mg/dL   Calcium 9.0 8.9 - 10.3 mg/dL   Total Protein 7.0 6.5 - 8.1 g/dL   Albumin 3.6 3.5 - 5.0 g/dL   AST 21 15 - 41 U/L   ALT 20 0 - 44 U/L   Alkaline Phosphatase 70 38 - 126 U/L   Total Bilirubin 0.7 0.3 - 1.2 mg/dL   GFR calc non Af Amer >60 >60 mL/min   GFR calc Af Amer >60 >60  mL/min    Comment: (NOTE) The eGFR has been calculated using the CKD EPI equation. This calculation has not been validated in all clinical situations. eGFR's persistently <60 mL/min signify possible Chronic Kidney Disease.    Anion gap 11 5 - 15    Comment: Performed at [facility], [address], [place], [place] [address]  CBC     Status: Abnormal   Collection Time: 04/05/18  4:21 AM  Result Value Ref Range   WBC 11.8 (H) 4.0 - 10.5 K/uL   RBC 4.61 3.87 - 5.11 MIL/uL   Hemoglobin 11.4 (L) 12.0 - 15.0 g/dL   HCT 38.3 36.0 - 46.0 %   MCV 83.1 80.0 - 100.0 fL   MCH 24.7 (L) 26.0 - 34.0 pg   MCHC 29.8 (L) 30.0 - 36.0 g/dL   RDW 16.2 (H) 11.5 - 15.5 %   Platelets 238 150 - 400 K/uL   nRBC 0.0 0.0 - 0.2 %    Comment: Performed at [facility], [address], [place], [place] [address]  Magnesium     Status: None   Collection Time: 04/05/18  4:21 AM  Result Value Ref Range   Magnesium 2.1 1.7 - 2.4 mg/dL  Comment: Performed at [facility], [address], [place], [place] [address]  Glucose, capillary     Status: Abnormal   Collection Time: 04/05/18 11:35 AM  Result Value Ref Range   Glucose-Capillary 300 (H) 70 - 99 mg/dL  Glucose, capillary     Status: Abnormal   Collection Time: 04/05/18  4:25 PM  Result Value Ref Range   Glucose-Capillary 321 (H) 70 - 99 mg/dL  Glucose, capillary     Status: Abnormal   Collection Time: 04/05/18  9:31 PM  Result Value Ref Range   Glucose-Capillary 187 (H) 70 - 99 mg/dL   Comment 1 Notify RN    Comment 2 Document in Chart     ABGS No results for input(s): PHART, PO2ART, TCO2, HCO3 in the last 72 hours.  Invalid input(s): PCO2 CULTURES Recent Results (from the past 240 hour(s))  MRSA PCR Screening     Status: Abnormal   Collection Time: 04/04/18  6:10 AM  Result Value Ref Range Status   MRSA by PCR POSITIVE (A) NEGATIVE Final    Comment:        The GeneXpert MRSA Assay (FDA approved for NASAL specimens only), is one component  of a comprehensive MRSA colonization surveillance program. It is not intended to diagnose MRSA infection nor to guide or monitor treatment for MRSA infections. RESULT CALLED TO, READ BACK BY AND VERIFIED WITH: MURPHY,E. AT 1756 ON 04/04/2018 BY EVA Performed at [facility], [address], [place], [place] [address]    Studies/Results: No results found.  Medications:  Prior to Admission:  Medications Prior to Admission  Medication Sig Dispense Refill Last Dose  . albuterol (PROVENTIL HFA;VENTOLIN HFA) 108 (90 BASE) MCG/ACT inhaler Inhale 2 puffs into the lungs every 4 (four) hours as needed for wheezing.   04/03/2018 at Unknown time  . ALPRAZolam (XANAX) 0.5 MG tablet Take 0.5 mg by mouth 2 (two) times daily as needed for anxiety or sleep.    04/03/2018 at Unknown time  . amLODipine (NORVASC) 5 MG tablet Take 1 tablet (5 mg total) by mouth daily. 30 tablet 3 04/03/2018 at Unknown time  . atorvastatin (LIPITOR) 80 MG tablet Take 1 tablet (80 mg total) by mouth daily at 6 PM. 30 tablet 12 04/03/2018 at Unknown time  . Cholecalciferol (VITAMIN D3) 5000 units CAPS Take 1 capsule (5,000 Units total) by mouth daily. 90 capsule 0 04/03/2018 at Unknown time  . Docusate Sodium 100 MG capsule Take 100 mg by mouth daily.    04/03/2018 at Unknown time  . Fluticasone-Salmeterol (ADVAIR) 250-50 MCG/DOSE AEPB Inhale 1 puff into the lungs 2 (two) times daily.   04/03/2018 at Unknown time  . gabapentin (NEURONTIN) 400 MG capsule Take 400 mg by mouth 3 (three) times daily.   04/03/2018 at Unknown time  . hydrALAZINE (APRESOLINE) 50 MG tablet Take 1 tablet (50 mg total) by mouth 3 (three) times daily. (Patient taking differently: Take 50 mg by mouth 2 (two) times daily. ) 120 tablet 12 04/03/2018 at Unknown time  . Insulin Detemir (LEVEMIR FLEXTOUCH) 100 UNIT/ML Pen Inject 60 Units into the skin at bedtime. 15 pen 2 04/03/2018 at Unknown time  . Insulin Pen Needle (B-D ULTRAFINE III SHORT PEN) 31G X 8 MM MISC Use  1  daily at bedtime to reject insulin. 100 each 1 04/03/2018 at Unknown time  . Lancets MISC Used 2 daily 100 each 3 04/03/2018 at Unknown time  . levothyroxine (SYNTHROID, LEVOTHROID) 200 MCG tablet Take 200 mcg  by mouth daily before breakfast.    04/03/2018 at Unknown time  . metFORMIN (GLUCOPHAGE) 500 MG tablet Take 500 mg by mouth 2 (two) times daily.   04/03/2018 at Unknown time  . mirabegron ER (MYRBETRIQ) 50 MG TB24 tablet Take 50 mg by mouth daily.   04/03/2018 at Unknown time  . nitroGLYCERIN (NITROSTAT) 0.4 MG SL tablet Place 1 tablet (0.4 mg total) under the tongue every 5 (five) minutes as needed for chest pain.  12 unknown  . pantoprazole (PROTONIX) 40 MG tablet 1 PO 30 MINUTES PRIOR TO MEALS BID (Patient taking differently: Take 40 mg by mouth 2 (two) times daily. ) 60 tablet 11 04/03/2018 at Unknown time  . predniSONE (DELTASONE) 10 MG tablet Take 10 mg by mouth daily with breakfast.   04/03/2018 at Unknown time  . sertraline (ZOLOFT) 100 MG tablet Take 0.5 tablets (50 mg total) by mouth at bedtime. (Patient taking differently: Take 100 mg by mouth at bedtime. ) 30 tablet 11 04/03/2018 at Unknown time  . solifenacin (VESICARE) 10 MG tablet Take 10 mg by mouth daily.    04/03/2018 at Unknown time  . traMADol (ULTRAM) 50 MG tablet Take by mouth every 6 (six) hours as needed for moderate pain or severe pain.    Past Month at Unknown time  . vitamin B-12 1000 MCG tablet Take 1 tablet (1,000 mcg total) by mouth daily.   04/03/2018 at Unknown time  . Carboxymethylcellulose Sod PF (REFRESH PLUS) 0.5 % SOLN Place 1 drop into both eyes daily as needed (for dry eye relief).    unknown  . levofloxacin (LEVAQUIN) 500 MG tablet Take 500 mg by mouth daily.   0 Completed Course at Unknown time  . methylPREDNISolone (MEDROL DOSEPAK) 4 MG TBPK tablet Take 4-24 mg by mouth See admin instructions. Take as directed per package instructions (6,5,4,3,2,1)  0 Completed Course at Unknown time   Scheduled: . ALPRAZolam   0.5 mg Oral BID  . amLODipine  5 mg Oral Daily  . atorvastatin  80 mg Oral q1800  . carvedilol  6.25 mg Oral BID WC  . Chlorhexidine Gluconate Cloth  6 each Topical Q0600  . docusate sodium  100 mg Oral BID  . fluticasone furoate-vilanterol  1 puff Inhalation Daily  . gabapentin  400 mg Oral TID  . guaiFENesin  1,200 mg Oral BID  . heparin injection (subcutaneous)  5,000 Units Subcutaneous Q8H  . insulin aspart  0-20 Units Subcutaneous TID WC  . insulin aspart  0-5 Units Subcutaneous QHS  . insulin glargine  10 Units Subcutaneous QHS  . ipratropium-albuterol  3 mL Nebulization Q6H  . levothyroxine  200 mcg Oral QAC breakfast  . linaclotide  72 mcg Oral QAC breakfast  . loratadine  10 mg Oral Daily  . mupirocin ointment  1 application Nasal BID  . pantoprazole  40 mg Oral QAC breakfast  . predniSONE  40 mg Oral Q breakfast  . sertraline  50 mg Oral QHS  . sodium chloride flush  3 mL Intravenous Q12H  . cyanocobalamin  1,000 mcg Oral Daily   Continuous: . cefTRIAXone (ROCEPHIN)  IV 1 g (04/05/18 1205)   EZM:[id number] **OR** acetaminophen, albuterol, nitroGLYCERIN  Assesment: She was admitted with COPD exacerbation with acute on chronic hypoxic respiratory failure.  She is better.  She is not requiring oxygen now  .  She has multiple other medical problems including diabetes and she is been on insulin at home and is now on sliding  scale but her long-acting insulin may need to be adjusted.  She has hypertension which is doing better  She has morbid obesity which is unchanged  She has chronic diastolic heart failure which seems to be stable Principal Problem:   COPD exacerbation ([place]) Active Problems:   Acquired hypothyroidism   Essential hypertension   Coronary atherosclerosis   Chronic diastolic heart failure ([facility])   GERD (gastroesophageal reflux disease)   Diabetes mellitus, type II, insulin dependent ([facility])   PAF (paroxysmal atrial fibrillation) ([facility])   Prolonged QT  interval    Plan: Continue treatments.  She can be transferred to the floor if we can get a bed.  Adjust her long-acting insulin depending on what her sugar is this morning.  Get her up and moving around.    LOS: 1 day   , L 04/06/2018, 8:01 AM

## 2018-04-06 NOTE — Progress Notes (Signed)
Patient sleeping no problems noted. Not using oxygen lying flat. Will let sleep through night.

## 2018-04-07 LAB — GLUCOSE, CAPILLARY
GLUCOSE-CAPILLARY: 170 mg/dL — AB (ref 70–99)
Glucose-Capillary: 217 mg/dL — ABNORMAL HIGH (ref 70–99)
Glucose-Capillary: 211 mg/dL — ABNORMAL HIGH (ref 70–99)
Glucose-Capillary: 304 mg/dL — ABNORMAL HIGH (ref 70–99)

## 2018-04-07 MED ORDER — PREDNISONE 20 MG PO TABS
40.0000 mg | ORAL_TABLET | Freq: Every day | ORAL | Status: DC
  Administered 2018-04-07 – 2018-04-11 (×5): 40 mg via ORAL
  Filled 2018-04-07 (×5): qty 2

## 2018-04-07 MED ORDER — GUAIFENESIN 100 MG/5ML PO SOLN
5.0000 mL | ORAL | Status: DC | PRN
  Administered 2018-04-07 – 2018-04-10 (×4): 100 mg via ORAL
  Filled 2018-04-07 (×4): qty 5

## 2018-04-07 NOTE — Plan of Care (Signed)

## 2018-04-07 NOTE — Progress Notes (Signed)
Physical Therapy Treatment Patient Details Name: Ann [name] MRN: [id number] DOB: 1945-09-30 Today's Date: 04/07/2018    History of Present Illness Ann [name] is a 72 y.o. female with medical history significant of adrenal insufficiency, chronic use of steroids, anxiety, osteoarthritis, CAD, history of stent placement, history of lower extremity cellulitis, chronic back pain, chronic diastolic heart failure, chronic neck pain, chronic renal insufficiency, history of diverticulosis/diverticulitis, type 2 diabetes, diabetic polyneuropathy, history of left lower extremity DVT, erosive gastritis, GERD, glaucoma, hiatal hernia, hyperlipidemia, hypertension, hypokalemia, hypothyroidism, internal hemorrhoids, morbid obesity, rectal polyp, tubular adenoma:, Vitamin B12 deficiency who is coming to the emergency department due to shortness of breath.    PT Comments    Patient demonstrates increased endurance/distance for gait training without loss of balance, slightly labored cadence, desaturated below 80% while on room air and put back on 2 LPM O2 to make it back to room.  Patient transferred to commode in room prior to gait training for a bowel movement and tolerated sitting up in chair after therapy while on 2 LPM O2.  Patient will benefit from continued physical therapy in hospital and recommended venue below to increase strength, balance, endurance for safe ADLs and gait.   Follow Up Recommendations  Home health PT;Supervision for mobility/OOB     Equipment Recommendations  None recommended by PT    Recommendations for Other Services       Precautions / Restrictions Precautions Precautions: Fall Restrictions Weight Bearing Restrictions: No    Mobility  Bed Mobility Overal bed mobility: Modified Independent                Transfers Overall transfer level: Needs assistance Equipment used: Rolling walker (2 wheeled) Transfers: Sit to/from [employer] Transfers Sit  to Stand: Supervision Stand pivot transfers: Supervision       General transfer comment: able to transfer to commode in room, afterwards O2 saturations dropped below 70%, had to put back on 2 LPM O2  Ambulation/Gait Ambulation/Gait assistance: Min guard Gait Distance (Feet): 100 Feet Assistive device: 1 person hand held assist Gait Pattern/deviations: Decreased step length - right;Decreased step length - left;Decreased stride length Gait velocity: decreased   General Gait Details: increased endurance/distance for ambulation with slow slightly labored cadence, mostly on 2 LPM O2, desaturation below 80% on room air   Stairs             Wheelchair Mobility    Modified Rankin (Stroke Patients Only)       Balance Overall balance assessment: Needs assistance Sitting-balance support: Feet supported;No upper extremity supported Sitting balance-Leahy Scale: Good     Standing balance support: During functional activity;Bilateral upper extremity supported Standing balance-Leahy Scale: Fair Standing balance comment: using RW                            Cognition Arousal/Alertness: Awake/alert Behavior During Therapy: WFL for tasks assessed/performed Overall Cognitive Status: Within Functional Limits for tasks assessed                                        Exercises General Exercises - Lower Extremity Long Arc Quad: Seated;AROM;Strengthening;Both;10 reps Hip Flexion/Marching: Seated;AROM;Strengthening;Both;10 reps Toe Raises: Seated;AROM;Strengthening;Both;10 reps Heel Raises: Seated;AROM;Strengthening;Both;10 reps    General Comments        Pertinent Vitals/Pain Pain Assessment: No/denies pain    Home Living  Prior Function            PT Goals (current goals can now be found in the care plan section) Acute Rehab PT Goals Patient Stated Goal: return home PT Goal Formulation: With patient/family Time  For Goal Achievement: 04/11/18 Potential to Achieve Goals: Good Progress towards PT goals: Progressing toward goals    Frequency    Min 3X/week      PT Plan Current plan remains appropriate    Co-evaluation              AM-PAC PT "6 Clicks" Daily Activity  Outcome Measure  Difficulty turning over in bed (including adjusting bedclothes, sheets and blankets)?: None Difficulty moving from lying on back to sitting on the side of the bed? : None Difficulty sitting down on and standing up from a chair with arms (e.g., wheelchair, bedside commode, etc,.)?: A Little Help needed moving to and from a bed to chair (including a wheelchair)?: A Little Help needed walking in hospital room?: A Little Help needed climbing 3-5 steps with a railing? : A Little 6 Click Score: 20    End of Session Equipment Utilized During Treatment: Oxygen Activity Tolerance: Patient tolerated treatment well;Patient limited by fatigue Patient left: in chair;with call bell/phone within reach Nurse Communication: Mobility status PT Visit Diagnosis: Unsteadiness on feet (R26.81);Other abnormalities of gait and mobility (R26.89);Muscle weakness (generalized) (M62.81)     Time: 5056-9794 PT Time Calculation (min) (ACUTE ONLY): 34 min  Charges:  $Gait Training: 8-22 mins $Therapeutic Exercise: 8-22 mins                     2:48 PM, 04/07/18 Lonell Grandchild, MPT Physical Therapist with [facility] 336 [phone] office [phone] mobile phone

## 2018-04-07 NOTE — Progress Notes (Signed)
Subjective: She says she feels some better.  She had to go back on oxygen last night.  No other new complaints.  Her breathing is better.  She says she is coughing and congested a little bit.  She has no other new complaints.  Objective: Vital signs in last 24 hours: Temp:  [97.6 F (36.4 C)-98.1 F (36.7 C)] 97.6 F (36.4 C) (11/12 0749) Pulse Rate:  [36-82] 82 (11/12 0700) Resp:  [14-31] 19 (11/12 0700) BP: (103-153)/(54-101) 110/59 (11/12 0600) SpO2:  [88 %-100 %] 99 % (11/12 0811) Weight:  [123.6 kg] 123.6 kg (11/12 0500) Weight change:  Last BM Date: 04/06/18  Intake/Output from previous day: 11/11 0701 - 11/12 0700 In: 221.3 [I.V.:3; IV Piggyback:218.3] Out: 900 [Urine:900]  PHYSICAL EXAM General appearance: alert, cooperative and morbidly obese Resp: clear to auscultation bilaterally Cardio: regular rate and rhythm, S1, S2 normal, no murmur, click, rub or gallop GI: soft, non-tender; bowel sounds normal; no masses,  no organomegaly Extremities: extremities normal, atraumatic, no cyanosis or edema  Lab Results:  Results for orders placed or performed during the hospital encounter of 04/04/18 (from the past 48 hour(s))  Glucose, capillary     Status: Abnormal   Collection Time: 04/05/18 11:35 AM  Result Value Ref Range   Glucose-Capillary 300 (H) 70 - 99 mg/dL  Glucose, capillary     Status: Abnormal   Collection Time: 04/05/18  4:25 PM  Result Value Ref Range   Glucose-Capillary 321 (H) 70 - 99 mg/dL  Glucose, capillary     Status: Abnormal   Collection Time: 04/05/18  9:31 PM  Result Value Ref Range   Glucose-Capillary 187 (H) 70 - 99 mg/dL   Comment 1 Notify RN    Comment 2 Document in Chart   Glucose, capillary     Status: Abnormal   Collection Time: 04/06/18  8:00 AM  Result Value Ref Range   Glucose-Capillary 128 (H) 70 - 99 mg/dL  Glucose, capillary     Status: Abnormal   Collection Time: 04/06/18 11:36 AM  Result Value Ref Range   Glucose-Capillary  178 (H) 70 - 99 mg/dL  Glucose, capillary     Status: Abnormal   Collection Time: 04/06/18  4:49 PM  Result Value Ref Range   Glucose-Capillary 125 (H) 70 - 99 mg/dL  Glucose, capillary     Status: Abnormal   Collection Time: 04/06/18  9:18 PM  Result Value Ref Range   Glucose-Capillary 200 (H) 70 - 99 mg/dL  Glucose, capillary     Status: Abnormal   Collection Time: 04/07/18  7:29 AM  Result Value Ref Range   Glucose-Capillary 170 (H) 70 - 99 mg/dL    ABGS No results for input(s): PHART, PO2ART, TCO2, HCO3 in the last 72 hours.  Invalid input(s): PCO2 CULTURES Recent Results (from the past 240 hour(s))  MRSA PCR Screening     Status: Abnormal   Collection Time: 04/04/18  6:10 AM  Result Value Ref Range Status   MRSA by PCR POSITIVE (A) NEGATIVE Final    Comment:        The GeneXpert MRSA Assay (FDA approved for NASAL specimens only), is one component of a comprehensive MRSA colonization surveillance program. It is not intended to diagnose MRSA infection nor to guide or monitor treatment for MRSA infections. RESULT CALLED TO, READ BACK BY AND VERIFIED WITH: MURPHY,E. AT 1756 ON 04/04/2018 BY EVA Performed at [facility], [address], [place], [place] [address]    Studies/Results:  No results found.  Medications:  Prior to Admission:  Medications Prior to Admission  Medication Sig Dispense Refill Last Dose  . albuterol (PROVENTIL HFA;VENTOLIN HFA) 108 (90 BASE) MCG/ACT inhaler Inhale 2 puffs into the lungs every 4 (four) hours as needed for wheezing.   04/03/2018 at Unknown time  . ALPRAZolam (XANAX) 0.5 MG tablet Take 0.5 mg by mouth 2 (two) times daily as needed for anxiety or sleep.    04/03/2018 at Unknown time  . amLODipine (NORVASC) 5 MG tablet Take 1 tablet (5 mg total) by mouth daily. 30 tablet 3 04/03/2018 at Unknown time  . atorvastatin (LIPITOR) 80 MG tablet Take 1 tablet (80 mg total) by mouth daily at 6 PM. 30 tablet 12 04/03/2018 at Unknown time  .  Cholecalciferol (VITAMIN D3) 5000 units CAPS Take 1 capsule (5,000 Units total) by mouth daily. 90 capsule 0 04/03/2018 at Unknown time  . Docusate Sodium 100 MG capsule Take 100 mg by mouth daily.    04/03/2018 at Unknown time  . Fluticasone-Salmeterol (ADVAIR) 250-50 MCG/DOSE AEPB Inhale 1 puff into the lungs 2 (two) times daily.   04/03/2018 at Unknown time  . gabapentin (NEURONTIN) 400 MG capsule Take 400 mg by mouth 3 (three) times daily.   04/03/2018 at Unknown time  . hydrALAZINE (APRESOLINE) 50 MG tablet Take 1 tablet (50 mg total) by mouth 3 (three) times daily. (Patient taking differently: Take 50 mg by mouth 2 (two) times daily. ) 120 tablet 12 04/03/2018 at Unknown time  . Insulin Detemir (LEVEMIR FLEXTOUCH) 100 UNIT/ML Pen Inject 60 Units into the skin at bedtime. 15 pen 2 04/03/2018 at Unknown time  . Insulin Pen Needle (B-D ULTRAFINE III SHORT PEN) 31G X 8 MM MISC Use  1 daily at bedtime to reject insulin. 100 each 1 04/03/2018 at Unknown time  . Lancets MISC Used 2 daily 100 each 3 04/03/2018 at Unknown time  . levothyroxine (SYNTHROID, LEVOTHROID) 200 MCG tablet Take 200 mcg by mouth daily before breakfast.    04/03/2018 at Unknown time  . metFORMIN (GLUCOPHAGE) 500 MG tablet Take 500 mg by mouth 2 (two) times daily.   04/03/2018 at Unknown time  . mirabegron ER (MYRBETRIQ) 50 MG TB24 tablet Take 50 mg by mouth daily.   04/03/2018 at Unknown time  . nitroGLYCERIN (NITROSTAT) 0.4 MG SL tablet Place 1 tablet (0.4 mg total) under the tongue every 5 (five) minutes as needed for chest pain.  12 unknown  . pantoprazole (PROTONIX) 40 MG tablet 1 PO 30 MINUTES PRIOR TO MEALS BID (Patient taking differently: Take 40 mg by mouth 2 (two) times daily. ) 60 tablet 11 04/03/2018 at Unknown time  . predniSONE (DELTASONE) 10 MG tablet Take 10 mg by mouth daily with breakfast.   04/03/2018 at Unknown time  . sertraline (ZOLOFT) 100 MG tablet Take 0.5 tablets (50 mg total) by mouth at bedtime. (Patient taking  differently: Take 100 mg by mouth at bedtime. ) 30 tablet 11 04/03/2018 at Unknown time  . solifenacin (VESICARE) 10 MG tablet Take 10 mg by mouth daily.    04/03/2018 at Unknown time  . traMADol (ULTRAM) 50 MG tablet Take by mouth every 6 (six) hours as needed for moderate pain or severe pain.    Past Month at Unknown time  . vitamin B-12 1000 MCG tablet Take 1 tablet (1,000 mcg total) by mouth daily.   04/03/2018 at Unknown time  . Carboxymethylcellulose Sod PF (REFRESH PLUS) 0.5 % SOLN Place 1 drop  into both eyes daily as needed (for dry eye relief).    unknown  . levofloxacin (LEVAQUIN) 500 MG tablet Take 500 mg by mouth daily.   0 Completed Course at Unknown time  . methylPREDNISolone (MEDROL DOSEPAK) 4 MG TBPK tablet Take 4-24 mg by mouth See admin instructions. Take as directed per package instructions (6,5,4,3,2,1)  0 Completed Course at Unknown time   Scheduled: . ALPRAZolam  0.5 mg Oral BID  . amLODipine  5 mg Oral Daily  . atorvastatin  80 mg Oral q1800  . carvedilol  6.25 mg Oral BID WC  . Chlorhexidine Gluconate Cloth  6 each Topical Q0600  . docusate sodium  100 mg Oral BID  . fluticasone furoate-vilanterol  1 puff Inhalation Daily  . gabapentin  400 mg Oral TID  . guaiFENesin  1,200 mg Oral BID  . heparin injection (subcutaneous)  5,000 Units Subcutaneous Q8H  . insulin aspart  0-20 Units Subcutaneous TID WC  . insulin aspart  0-5 Units Subcutaneous QHS  . insulin glargine  10 Units Subcutaneous QHS  . ipratropium-albuterol  3 mL Nebulization Q6H  . levothyroxine  200 mcg Oral QAC breakfast  . linaclotide  72 mcg Oral QAC breakfast  . loratadine  10 mg Oral Daily  . mupirocin ointment  1 application Nasal BID  . pantoprazole  40 mg Oral QAC breakfast  . sertraline  50 mg Oral QHS  . sodium chloride flush  3 mL Intravenous Q12H  . cyanocobalamin  1,000 mcg Oral Daily   Continuous: . cefTRIAXone (ROCEPHIN)  IV Stopped (04/06/18 0912)   OTL:[id number] **OR**  acetaminophen, albuterol, nitroGLYCERIN  Assesment: She was admitted with COPD exacerbation and acute hypoxic respiratory failure.  She is better but had to go back on oxygen last night.  She has continued cough and congestion.  We discussed her situation with prednisone and she understands that she cannot come off of prednisone because of her problems with adrenal insufficiency.  She has diabetes which is fairly well controlled  He has hypertension is doing okay  She has chronic diastolic heart failure asymptomatic at this point  She has adrenal insufficiency and although I am going to switch her over to oral steroids she will have to remain on steroids lifelong Principal Problem:   COPD exacerbation ([place]) Active Problems:   Acquired hypothyroidism   Essential hypertension   Coronary atherosclerosis   Chronic diastolic heart failure ([facility])   GERD (gastroesophageal reflux disease)   Diabetes mellitus, type II, insulin dependent (HCC)   PAF (paroxysmal atrial fibrillation) (H[facility])   Prolonged QT interval    Plan: Switch to oral steroids.  Continue other treatments    LOS: 2 days   Dnyla Antonetti L 04/07/2018, 8:23 AM

## 2018-04-08 LAB — GLUCOSE, CAPILLARY
GLUCOSE-CAPILLARY: 177 mg/dL — AB (ref 70–99)
GLUCOSE-CAPILLARY: 256 mg/dL — AB (ref 70–99)
GLUCOSE-CAPILLARY: 333 mg/dL — AB (ref 70–99)
Glucose-Capillary: 201 mg/dL — ABNORMAL HIGH (ref 70–99)

## 2018-04-08 MED ORDER — INSULIN GLARGINE 100 UNIT/ML ~~LOC~~ SOLN
20.0000 [IU] | Freq: Every day | SUBCUTANEOUS | Status: DC
  Administered 2018-04-08 – 2018-04-10 (×3): 20 [IU] via SUBCUTANEOUS
  Filled 2018-04-08 (×5): qty 0.2

## 2018-04-08 MED ORDER — INSULIN ASPART 100 UNIT/ML ~~LOC~~ SOLN
4.0000 [IU] | Freq: Three times a day (TID) | SUBCUTANEOUS | Status: DC
  Administered 2018-04-08 – 2018-04-11 (×8): 4 [IU] via SUBCUTANEOUS

## 2018-04-08 NOTE — Care Management Important Message (Signed)
Important Message  Patient Details  Name: Ann [name] MRN: [id number] Date of Birth: 06-22-1945   Medicare Important Message Given:  Yes    Kazue Cerro, Chauncey Reading, RN 04/08/2018, 1:50 PM

## 2018-04-08 NOTE — Progress Notes (Signed)
Inpatient Diabetes Program Recommendations  AACE/ADA: New Consensus Statement on Inpatient Glycemic Control (2015)  Target Ranges:  Prepandial:   less than 140 mg/dL      Peak postprandial:   less than 180 mg/dL (1-2 hours)      Critically ill patients:  140 - 180 mg/dL   Results for Ann [name], Ann [name] (MRN [id number]) as of 04/08/2018 08:13  Ref. Range 04/07/2018 07:29 04/07/2018 11:34 04/07/2018 16:38 04/07/2018 21:14  Glucose-Capillary Latest Ref Range: 70 - 99 mg/dL 170 (H)  4 units NOVOLOG  217 (H)  7 units NOVOLOG  211 (H)  7 units NOVOLOG  304 (H)  4 units NOVOLOG +  10 units LANTUS    Home DM Meds: Levemir 60 units QHS        Metformin 500 mg BID  Current Orders: Lantus 20 units QHS      Novolog Resistant Correction Scale/ SSI (0-20 units) TID AC + HS      MD- Note Lantus increased to 20 units QHS for tonight.  Patient getting Prednisone 40 mg Daily which may be contributing to elevated afternoon CBGs as well.  Patient is eating well.  Please also consider starting Novolog Meal Coverage:  Novolog 4 units TID with meals  (Please add the following Hold Parameters: Hold if pt eats <50% of meal, Hold if pt NPO)     --Will follow patient during hospitalization--  Wyn Quaker RN, MSN, CDE Diabetes Coordinator Inpatient Glycemic Control Team Team Pager: [phone] (8a-5p)

## 2018-04-08 NOTE — Progress Notes (Signed)
Subjective: She says she still has a lot of cough and congestion.  No new complaints otherwise.  Her blood sugar is still elevated.  She is coughing up some sputum.  She is still short of breath with exertion.  Objective: Vital signs in last 24 hours: Temp:  [97.7 F (36.5 C)-98.6 F (37 C)] 97.7 F (36.5 C) (11/13 0740) Pulse Rate:  [41-105] 76 (11/13 0600) Resp:  [14-32] 17 (11/13 0600) BP: (112-157)/(60-126) 157/66 (11/13 0600) SpO2:  [78 %-100 %] 98 % (11/13 0817) Weight:  [123.8 kg] 123.8 kg (11/13 0500) Weight change: 0.2 kg Last BM Date: 04/07/18  Intake/Output from previous day: 11/12 0701 - 11/13 0700 In: 1579.9 [P.O.:1480; IV Piggyback:99.9] Out: 526 [Urine:526]  PHYSICAL EXAM General appearance: alert, cooperative, no distress and morbidly obese Resp: rhonchi bilaterally Cardio: regular rate and rhythm, S1, S2 normal, no murmur, click, rub or gallop GI: soft, non-tender; bowel sounds normal; no masses,  no organomegaly Extremities: extremities normal, atraumatic, no cyanosis or edema  Lab Results:  Results for orders placed or performed during the hospital encounter of 04/04/18 (from the past 48 hour(s))  Glucose, capillary     Status: Abnormal   Collection Time: 04/06/18 11:36 AM  Result Value Ref Range   Glucose-Capillary 178 (H) 70 - 99 mg/dL  Glucose, capillary     Status: Abnormal   Collection Time: 04/06/18  4:49 PM  Result Value Ref Range   Glucose-Capillary 125 (H) 70 - 99 mg/dL  Glucose, capillary     Status: Abnormal   Collection Time: 04/06/18  9:18 PM  Result Value Ref Range   Glucose-Capillary 200 (H) 70 - 99 mg/dL  Glucose, capillary     Status: Abnormal   Collection Time: 04/07/18  7:29 AM  Result Value Ref Range   Glucose-Capillary 170 (H) 70 - 99 mg/dL  Glucose, capillary     Status: Abnormal   Collection Time: 04/07/18 11:34 AM  Result Value Ref Range   Glucose-Capillary 217 (H) 70 - 99 mg/dL  Glucose, capillary     Status: Abnormal   Collection Time: 04/07/18  4:38 PM  Result Value Ref Range   Glucose-Capillary 211 (H) 70 - 99 mg/dL  Glucose, capillary     Status: Abnormal   Collection Time: 04/07/18  9:14 PM  Result Value Ref Range   Glucose-Capillary 304 (H) 70 - 99 mg/dL  Glucose, capillary     Status: Abnormal   Collection Time: 04/08/18  7:55 AM  Result Value Ref Range   Glucose-Capillary 201 (H) 70 - 99 mg/dL   Comment 1 Notify RN    Comment 2 Document in Chart     ABGS No results for input(s): PHART, PO2ART, TCO2, HCO3 in the last 72 hours.  Invalid input(s): PCO2 CULTURES Recent Results (from the past 240 hour(s))  MRSA PCR Screening     Status: Abnormal   Collection Time: 04/04/18  6:10 AM  Result Value Ref Range Status   MRSA by PCR POSITIVE (A) NEGATIVE Final    Comment:        The GeneXpert MRSA Assay (FDA approved for NASAL specimens only), is one component of a comprehensive MRSA colonization surveillance program. It is not intended to diagnose MRSA infection nor to guide or monitor treatment for MRSA infections. RESULT CALLED TO, READ BACK BY AND VERIFIED WITH: MURPHY,E. AT 1756 ON 04/04/2018 BY EVA Performed at [facility], [address], [place], [place] [address]    Studies/Results: No results found.  Medications:  Prior to Admission:  Medications Prior to Admission  Medication Sig Dispense Refill Last Dose  . albuterol (PROVENTIL HFA;VENTOLIN HFA) 108 (90 BASE) MCG/ACT inhaler Inhale 2 puffs into the lungs every 4 (four) hours as needed for wheezing.   04/03/2018 at Unknown time  . ALPRAZolam (XANAX) 0.5 MG tablet Take 0.5 mg by mouth 2 (two) times daily as needed for anxiety or sleep.    04/03/2018 at Unknown time  . amLODipine (NORVASC) 5 MG tablet Take 1 tablet (5 mg total) by mouth daily. 30 tablet 3 04/03/2018 at Unknown time  . atorvastatin (LIPITOR) 80 MG tablet Take 1 tablet (80 mg total) by mouth daily at 6 PM. 30 tablet 12 04/03/2018 at Unknown time  . Cholecalciferol  (VITAMIN D3) 5000 units CAPS Take 1 capsule (5,000 Units total) by mouth daily. 90 capsule 0 04/03/2018 at Unknown time  . Docusate Sodium 100 MG capsule Take 100 mg by mouth daily.    04/03/2018 at Unknown time  . Fluticasone-Salmeterol (ADVAIR) 250-50 MCG/DOSE AEPB Inhale 1 puff into the lungs 2 (two) times daily.   04/03/2018 at Unknown time  . gabapentin (NEURONTIN) 400 MG capsule Take 400 mg by mouth 3 (three) times daily.   04/03/2018 at Unknown time  . hydrALAZINE (APRESOLINE) 50 MG tablet Take 1 tablet (50 mg total) by mouth 3 (three) times daily. (Patient taking differently: Take 50 mg by mouth 2 (two) times daily. ) 120 tablet 12 04/03/2018 at Unknown time  . Insulin Detemir (LEVEMIR FLEXTOUCH) 100 UNIT/ML Pen Inject 60 Units into the skin at bedtime. 15 pen 2 04/03/2018 at Unknown time  . Insulin Pen Needle (B-D ULTRAFINE III SHORT PEN) 31G X 8 MM MISC Use  1 daily at bedtime to reject insulin. 100 each 1 04/03/2018 at Unknown time  . Lancets MISC Used 2 daily 100 each 3 04/03/2018 at Unknown time  . levothyroxine (SYNTHROID, LEVOTHROID) 200 MCG tablet Take 200 mcg by mouth daily before breakfast.    04/03/2018 at Unknown time  . metFORMIN (GLUCOPHAGE) 500 MG tablet Take 500 mg by mouth 2 (two) times daily.   04/03/2018 at Unknown time  . mirabegron ER (MYRBETRIQ) 50 MG TB24 tablet Take 50 mg by mouth daily.   04/03/2018 at Unknown time  . nitroGLYCERIN (NITROSTAT) 0.4 MG SL tablet Place 1 tablet (0.4 mg total) under the tongue every 5 (five) minutes as needed for chest pain.  12 unknown  . pantoprazole (PROTONIX) 40 MG tablet 1 PO 30 MINUTES PRIOR TO MEALS BID (Patient taking differently: Take 40 mg by mouth 2 (two) times daily. ) 60 tablet 11 04/03/2018 at Unknown time  . predniSONE (DELTASONE) 10 MG tablet Take 10 mg by mouth daily with breakfast.   04/03/2018 at Unknown time  . sertraline (ZOLOFT) 100 MG tablet Take 0.5 tablets (50 mg total) by mouth at bedtime. (Patient taking differently: Take 100  mg by mouth at bedtime. ) 30 tablet 11 04/03/2018 at Unknown time  . solifenacin (VESICARE) 10 MG tablet Take 10 mg by mouth daily.    04/03/2018 at Unknown time  . traMADol (ULTRAM) 50 MG tablet Take by mouth every 6 (six) hours as needed for moderate pain or severe pain.    Past Month at Unknown time  . vitamin B-12 1000 MCG tablet Take 1 tablet (1,000 mcg total) by mouth daily.   04/03/2018 at Unknown time  . Carboxymethylcellulose Sod PF (REFRESH PLUS) 0.5 % SOLN Place 1 drop into both eyes daily as needed (  for dry eye relief).    unknown  . levofloxacin (LEVAQUIN) 500 MG tablet Take 500 mg by mouth daily.   0 Completed Course at Unknown time  . methylPREDNISolone (MEDROL DOSEPAK) 4 MG TBPK tablet Take 4-24 mg by mouth See admin instructions. Take as directed per package instructions (6,5,4,3,2,1)  0 Completed Course at Unknown time   Scheduled: . ALPRAZolam  0.5 mg Oral BID  . amLODipine  5 mg Oral Daily  . atorvastatin  80 mg Oral q1800  . carvedilol  6.25 mg Oral BID WC  . Chlorhexidine Gluconate Cloth  6 each Topical Q0600  . docusate sodium  100 mg Oral BID  . fluticasone furoate-vilanterol  1 puff Inhalation Daily  . gabapentin  400 mg Oral TID  . guaiFENesin  1,200 mg Oral BID  . heparin injection (subcutaneous)  5,000 Units Subcutaneous Q8H  . insulin aspart  0-20 Units Subcutaneous TID WC  . insulin aspart  0-5 Units Subcutaneous QHS  . insulin aspart  4 Units Subcutaneous TID WC  . insulin glargine  20 Units Subcutaneous QHS  . ipratropium-albuterol  3 mL Nebulization Q6H  . levothyroxine  200 mcg Oral QAC breakfast  . linaclotide  72 mcg Oral QAC breakfast  . loratadine  10 mg Oral Daily  . mupirocin ointment  1 application Nasal BID  . pantoprazole  40 mg Oral QAC breakfast  . predniSONE  40 mg Oral Q breakfast  . sertraline  50 mg Oral QHS  . sodium chloride flush  3 mL Intravenous Q12H  . cyanocobalamin  1,000 mcg Oral Daily   Continuous: . cefTRIAXone (ROCEPHIN)  IV  Stopped (04/07/18 0902)   IBB:[id number] **OR** acetaminophen, albuterol, guaiFENesin, nitroGLYCERIN  Assesment: She was admitted with COPD exacerbation and she is doing better.  She still has cough and congestion.  No other new complaints.  She has chronic diastolic heart failure which seems to be stable  She has coronary disease at baseline but she is not having any chest pain  She has diabetes and her blood sugars better but not controlled yet  She has morbid obesity which is stable.    She has adrenal insufficiency at baseline which is stable Principal Problem:   COPD exacerbation ([facility]) Active Problems:   Acquired hypothyroidism   Essential hypertension   Coronary atherosclerosis   Chronic diastolic heart failure ([facility])   GERD (gastroesophageal reflux disease)   Diabetes mellitus, type II, insulin dependent (HCC)   PAF (paroxysmal atrial fibrillation) (HCC)   Prolonged QT interval    Plan: Continue treatments.  Add mealtime insulin.  No other new medications.  Okay to transfer to telemetry bed when a bed is available.  I think she will probably be ready for discharge in the next 48 hours    LOS: 3 days   Horace Wishon L 04/08/2018, 8:39 AM

## 2018-04-09 ENCOUNTER — Inpatient Hospital Stay (HOSPITAL_COMMUNITY): Payer: Medicare Other

## 2018-04-09 LAB — GLUCOSE, CAPILLARY
GLUCOSE-CAPILLARY: 155 mg/dL — AB (ref 70–99)
Glucose-Capillary: 108 mg/dL — ABNORMAL HIGH (ref 70–99)
Glucose-Capillary: 275 mg/dL — ABNORMAL HIGH (ref 70–99)
Glucose-Capillary: 249 mg/dL — ABNORMAL HIGH (ref 70–99)

## 2018-04-09 MED ORDER — BENZONATATE 100 MG PO CAPS
200.0000 mg | ORAL_CAPSULE | Freq: Three times a day (TID) | ORAL | Status: DC | PRN
  Administered 2018-04-09: 200 mg via ORAL
  Filled 2018-04-09: qty 2

## 2018-04-09 NOTE — Progress Notes (Signed)
Subjective: She says she had more trouble last night.  She had more cough and congestion.  She is coughed up a little bit of blood.  She has no other new complaints.  Her food is not right this morning so I am going to get them to check on that.  Objective: Vital signs in last 24 hours: Temp:  [97.5 F (36.4 C)-97.6 F (36.4 C)] 97.5 F (36.4 C) (11/14 0450) Pulse Rate:  [54-70] 54 (11/14 0450) Resp:  [16-25] 18 (11/14 0450) BP: (144-155)/(78-87) 152/87 (11/14 0450) SpO2:  [92 %-100 %] 95 % (11/14 0744) Weight change:  Last BM Date: 04/07/18  Intake/Output from previous day: 11/13 0701 - 11/14 0700 In: 1320 [P.O.:1320] Out: 1000 [Urine:1000]  PHYSICAL EXAM General appearance: alert, cooperative and no distress Resp: Her chest is actually pretty clear despite her symptoms Cardio: regular rate and rhythm, S1, S2 normal, no murmur, click, rub or gallop GI: soft, non-tender; bowel sounds normal; no masses,  no organomegaly Extremities: Trace to 1+ edema  Lab Results:  Results for orders placed or performed during the hospital encounter of 04/04/18 (from the past 48 hour(s))  Glucose, capillary     Status: Abnormal   Collection Time: 04/07/18 11:34 AM  Result Value Ref Range   Glucose-Capillary 217 (H) 70 - 99 mg/dL  Glucose, capillary     Status: Abnormal   Collection Time: 04/07/18  4:38 PM  Result Value Ref Range   Glucose-Capillary 211 (H) 70 - 99 mg/dL  Glucose, capillary     Status: Abnormal   Collection Time: 04/07/18  9:14 PM  Result Value Ref Range   Glucose-Capillary 304 (H) 70 - 99 mg/dL  Glucose, capillary     Status: Abnormal   Collection Time: 04/08/18  7:55 AM  Result Value Ref Range   Glucose-Capillary 201 (H) 70 - 99 mg/dL   Comment 1 Notify RN    Comment 2 Document in Chart   Glucose, capillary     Status: Abnormal   Collection Time: 04/08/18 11:46 AM  Result Value Ref Range   Glucose-Capillary 177 (H) 70 - 99 mg/dL  Glucose, capillary     Status:  Abnormal   Collection Time: 04/08/18  4:50 PM  Result Value Ref Range   Glucose-Capillary 256 (H) 70 - 99 mg/dL  Glucose, capillary     Status: Abnormal   Collection Time: 04/08/18  8:36 PM  Result Value Ref Range   Glucose-Capillary 333 (H) 70 - 99 mg/dL   Comment 1 Notify RN    Comment 2 Document in Chart   Glucose, capillary     Status: Abnormal   Collection Time: 04/09/18  7:34 AM  Result Value Ref Range   Glucose-Capillary 108 (H) 70 - 99 mg/dL    ABGS No results for input(s): PHART, PO2ART, TCO2, HCO3 in the last 72 hours.  Invalid input(s): PCO2 CULTURES Recent Results (from the past 240 hour(s))  MRSA PCR Screening     Status: Abnormal   Collection Time: 04/04/18  6:10 AM  Result Value Ref Range Status   MRSA by PCR POSITIVE (A) NEGATIVE Final    Comment:        The GeneXpert MRSA Assay (FDA approved for NASAL specimens only), is one component of a comprehensive MRSA colonization surveillance program. It is not intended to diagnose MRSA infection nor to guide or monitor treatment for MRSA infections. RESULT CALLED TO, READ BACK BY AND VERIFIED WITH: MURPHY,E. AT 1756 ON 04/04/2018 BY EVA Performed  at [facility], [address], [place], [place] [address]    Studies/Results: No results found.  Medications:  Prior to Admission:  Medications Prior to Admission  Medication Sig Dispense Refill Last Dose  . albuterol (PROVENTIL HFA;VENTOLIN HFA) 108 (90 BASE) MCG/ACT inhaler Inhale 2 puffs into the lungs every 4 (four) hours as needed for wheezing.   04/03/2018 at Unknown time  . ALPRAZolam (XANAX) 0.5 MG tablet Take 0.5 mg by mouth 2 (two) times daily as needed for anxiety or sleep.    04/03/2018 at Unknown time  . amLODipine (NORVASC) 5 MG tablet Take 1 tablet (5 mg total) by mouth daily. 30 tablet 3 04/03/2018 at Unknown time  . atorvastatin (LIPITOR) 80 MG tablet Take 1 tablet (80 mg total) by mouth daily at 6 PM. 30 tablet 12 04/03/2018 at Unknown time  .  Cholecalciferol (VITAMIN D3) 5000 units CAPS Take 1 capsule (5,000 Units total) by mouth daily. 90 capsule 0 04/03/2018 at Unknown time  . Docusate Sodium 100 MG capsule Take 100 mg by mouth daily.    04/03/2018 at Unknown time  . Fluticasone-Salmeterol (ADVAIR) 250-50 MCG/DOSE AEPB Inhale 1 puff into the lungs 2 (two) times daily.   04/03/2018 at Unknown time  . gabapentin (NEURONTIN) 400 MG capsule Take 400 mg by mouth 3 (three) times daily.   04/03/2018 at Unknown time  . hydrALAZINE (APRESOLINE) 50 MG tablet Take 1 tablet (50 mg total) by mouth 3 (three) times daily. (Patient taking differently: Take 50 mg by mouth 2 (two) times daily. ) 120 tablet 12 04/03/2018 at Unknown time  . Insulin Detemir (LEVEMIR FLEXTOUCH) 100 UNIT/ML Pen Inject 60 Units into the skin at bedtime. 15 pen 2 04/03/2018 at Unknown time  . Insulin Pen Needle (B-D ULTRAFINE III SHORT PEN) 31G X 8 MM MISC Use  1 daily at bedtime to reject insulin. 100 each 1 04/03/2018 at Unknown time  . Lancets MISC Used 2 daily 100 each 3 04/03/2018 at Unknown time  . levothyroxine (SYNTHROID, LEVOTHROID) 200 MCG tablet Take 200 mcg by mouth daily before breakfast.    04/03/2018 at Unknown time  . metFORMIN (GLUCOPHAGE) 500 MG tablet Take 500 mg by mouth 2 (two) times daily.   04/03/2018 at Unknown time  . mirabegron ER (MYRBETRIQ) 50 MG TB24 tablet Take 50 mg by mouth daily.   04/03/2018 at Unknown time  . nitroGLYCERIN (NITROSTAT) 0.4 MG SL tablet Place 1 tablet (0.4 mg total) under the tongue every 5 (five) minutes as needed for chest pain.  12 unknown  . pantoprazole (PROTONIX) 40 MG tablet 1 PO 30 MINUTES PRIOR TO MEALS BID (Patient taking differently: Take 40 mg by mouth 2 (two) times daily. ) 60 tablet 11 04/03/2018 at Unknown time  . predniSONE (DELTASONE) 10 MG tablet Take 10 mg by mouth daily with breakfast.   04/03/2018 at Unknown time  . sertraline (ZOLOFT) 100 MG tablet Take 0.5 tablets (50 mg total) by mouth at bedtime. (Patient taking  differently: Take 100 mg by mouth at bedtime. ) 30 tablet 11 04/03/2018 at Unknown time  . solifenacin (VESICARE) 10 MG tablet Take 10 mg by mouth daily.    04/03/2018 at Unknown time  . traMADol (ULTRAM) 50 MG tablet Take by mouth every 6 (six) hours as needed for moderate pain or severe pain.    Past Month at Unknown time  . vitamin B-12 1000 MCG tablet Take 1 tablet (1,000 mcg total) by mouth daily.   04/03/2018 at Unknown  time  . Carboxymethylcellulose Sod PF (REFRESH PLUS) 0.5 % SOLN Place 1 drop into both eyes daily as needed (for dry eye relief).    unknown  . levofloxacin (LEVAQUIN) 500 MG tablet Take 500 mg by mouth daily.   0 Completed Course at Unknown time  . methylPREDNISolone (MEDROL DOSEPAK) 4 MG TBPK tablet Take 4-24 mg by mouth See admin instructions. Take as directed per package instructions (6,5,4,3,2,1)  0 Completed Course at Unknown time   Scheduled: . ALPRAZolam  0.5 mg Oral BID  . amLODipine  5 mg Oral Daily  . atorvastatin  80 mg Oral q1800  . carvedilol  6.25 mg Oral BID WC  . docusate sodium  100 mg Oral BID  . fluticasone furoate-vilanterol  1 puff Inhalation Daily  . gabapentin  400 mg Oral TID  . guaiFENesin  1,200 mg Oral BID  . heparin injection (subcutaneous)  5,000 Units Subcutaneous Q8H  . insulin aspart  0-20 Units Subcutaneous TID WC  . insulin aspart  0-5 Units Subcutaneous QHS  . insulin aspart  4 Units Subcutaneous TID WC  . insulin glargine  20 Units Subcutaneous QHS  . ipratropium-albuterol  3 mL Nebulization Q6H  . levothyroxine  200 mcg Oral QAC breakfast  . linaclotide  72 mcg Oral QAC breakfast  . loratadine  10 mg Oral Daily  . mupirocin ointment  1 application Nasal BID  . pantoprazole  40 mg Oral QAC breakfast  . predniSONE  40 mg Oral Q breakfast  . sertraline  50 mg Oral QHS  . sodium chloride flush  3 mL Intravenous Q12H  . cyanocobalamin  1,000 mcg Oral Daily   Continuous: . cefTRIAXone (ROCEPHIN)  IV 1 g (04/08/18 0916)    NUU:[id number] **OR** acetaminophen, albuterol, benzonatate, guaiFENesin, nitroGLYCERIN  Assesment: She is admitted with COPD exacerbation.  She has generally improved but had more trouble last night.  She is comfortable some blood.  She is still more short of breath.  She has chronic diastolic heart failure and I do not think she is having exacerbation but will check a chest x-ray to be sure  She has diabetes which is better controlled  She has coronary disease which is stable Principal Problem:   COPD exacerbation ([place]) Active Problems:   Acquired hypothyroidism   Essential hypertension   Coronary atherosclerosis   Chronic diastolic heart failure (HCC)   GERD (gastroesophageal reflux disease)   Diabetes mellitus, type II, insulin dependent (HCC)   PAF (paroxysmal atrial fibrillation) (HCC)   Prolonged QT interval    Plan: Continue treatments.  Check chest x-ray.  Add Tessalon.    LOS: 4 days   Neria Procter L 04/09/2018, 8:58 AM

## 2018-04-09 NOTE — Progress Notes (Signed)
Physical Therapy Treatment Patient Details Name: Ann [name] MRN: [id number] DOB: Aug 11, 1945 Today's Date: 04/09/2018    History of Present Illness MICCA MATURA is a 72 y.o. female with medical history significant of adrenal insufficiency, chronic use of steroids, anxiety, osteoarthritis, CAD, history of stent placement, history of lower extremity cellulitis, chronic back pain, chronic diastolic heart failure, chronic neck pain, chronic renal insufficiency, history of diverticulosis/diverticulitis, type 2 diabetes, diabetic polyneuropathy, history of left lower extremity DVT, erosive gastritis, GERD, glaucoma, hiatal hernia, hyperlipidemia, hypertension, hypokalemia, hypothyroidism, internal hemorrhoids, morbid obesity, rectal polyp, tubular adenoma:, Vitamin B12 deficiency who is coming to the emergency department due to shortness of breath.    PT Comments    Patient presents on room air and demonstrates good return for completing BLE ROM/strengthening while seated at bedside.  Patient states he has been coughing and had episode of spitting up blood - RN/MD aware.  Patient ambulated in hallway without loss of balance with O2 saturation between 89-95%.  Patient requested to go back to bed due to c/o buttock discomfort and wanting not to put body weight on bottom.  Patient will benefit from continued physical therapy in hospital and recommended venue below to increase strength, balance, endurance for safe ADLs and gait.    Follow Up Recommendations  Home health PT;Supervision for mobility/OOB     Equipment Recommendations  None recommended by PT    Recommendations for Other Services       Precautions / Restrictions Precautions Precautions: Fall Restrictions Weight Bearing Restrictions: No    Mobility  Bed Mobility Overal bed mobility: Modified Independent             General bed mobility comments: used bed rail  Transfers Overall transfer level: Needs  assistance Equipment used: Rolling walker (2 wheeled) Transfers: Sit to/from [employer] Sit to Stand: Supervision Stand pivot transfers: Supervision       General transfer comment: requires less assistance for sit to stands and transfers  Ambulation/Gait Ambulation/Gait assistance: Supervision Gait Distance (Feet): 95 Feet Assistive device: Rolling walker (2 wheeled) Gait Pattern/deviations: Decreased step length - right;Decreased step length - left;Decreased stride length Gait velocity: decreased   General Gait Details: slightly labored slow cadence without loss of balance, on room air with O2 saturations between 89-95%   Stairs             Wheelchair Mobility    Modified Rankin (Stroke Patients Only)       Balance Overall balance assessment: Needs assistance Sitting-balance support: Feet supported;No upper extremity supported Sitting balance-Leahy Scale: Good     Standing balance support: Bilateral upper extremity supported;During functional activity Standing balance-Leahy Scale: Fair Standing balance comment: fair/good using RW                            Cognition Arousal/Alertness: Awake/alert Behavior During Therapy: WFL for tasks assessed/performed Overall Cognitive Status: Within Functional Limits for tasks assessed                                        Exercises General Exercises - Lower Extremity Long Arc Quad: Seated;AROM;Strengthening;Both;10 reps Hip Flexion/Marching: Seated;AROM;Strengthening;Both;10 reps Toe Raises: Seated;AROM;Strengthening;Both;10 reps Heel Raises: Seated;AROM;Strengthening;Both;10 reps    General Comments        Pertinent Vitals/Pain Pain Assessment: No/denies pain    Home Living  Prior Function            PT Goals (current goals can now be found in the care plan section) Acute Rehab PT Goals Patient Stated Goal: return home PT Goal  Formulation: With patient/family Time For Goal Achievement: 04/11/18 Potential to Achieve Goals: Good Progress towards PT goals: Progressing toward goals    Frequency    Min 3X/week      PT Plan Current plan remains appropriate    Co-evaluation              AM-PAC PT "6 Clicks" Daily Activity  Outcome Measure  Difficulty turning over in bed (including adjusting bedclothes, sheets and blankets)?: None Difficulty moving from lying on back to sitting on the side of the bed? : None Difficulty sitting down on and standing up from a chair with arms (e.g., wheelchair, bedside commode, etc,.)?: A Little Help needed moving to and from a bed to chair (including a wheelchair)?: A Little Help needed walking in hospital room?: A Little Help needed climbing 3-5 steps with a railing? : A Little 6 Click Score: 20    End of Session   Activity Tolerance: Patient tolerated treatment well;Patient limited by fatigue Patient left: in bed;with call bell/phone within reach Nurse Communication: Mobility status PT Visit Diagnosis: Unsteadiness on feet (R26.81);Other abnormalities of gait and mobility (R26.89);Muscle weakness (generalized) (M62.81)     Time: 6803-2122 PT Time Calculation (min) (ACUTE ONLY): 30 min  Charges:  $Gait Training: 8-22 mins $Therapeutic Exercise: 8-22 mins                     2:35 PM, 04/09/18 Lonell Grandchild, MPT Physical Therapist with [facility] [phone] office [phone] mobile phone

## 2018-04-10 LAB — GLUCOSE, CAPILLARY
GLUCOSE-CAPILLARY: 121 mg/dL — AB (ref 70–99)
GLUCOSE-CAPILLARY: 244 mg/dL — AB (ref 70–99)
Glucose-Capillary: 159 mg/dL — ABNORMAL HIGH (ref 70–99)
Glucose-Capillary: 260 mg/dL — ABNORMAL HIGH (ref 70–99)

## 2018-04-10 MED ORDER — CEFDINIR 300 MG PO CAPS
300.0000 mg | ORAL_CAPSULE | Freq: Two times a day (BID) | ORAL | Status: DC
  Administered 2018-04-10 – 2018-04-11 (×3): 300 mg via ORAL
  Filled 2018-04-10 (×3): qty 1

## 2018-04-10 NOTE — Progress Notes (Signed)
Physical Therapy Treatment Patient Details Name: Ann [name] MRN: [id number] DOB: Jan 28, 1946 Today's Date: 04/10/2018    History of Present Illness Ann [name] is a 72 y.o. female with medical history significant of adrenal insufficiency, chronic use of steroids, anxiety, osteoarthritis, CAD, history of stent placement, history of lower extremity cellulitis, chronic back pain, chronic diastolic heart failure, chronic neck pain, chronic renal insufficiency, history of diverticulosis/diverticulitis, type 2 diabetes, diabetic polyneuropathy, history of left lower extremity DVT, erosive gastritis, GERD, glaucoma, hiatal hernia, hyperlipidemia, hypertension, hypokalemia, hypothyroidism, internal hemorrhoids, morbid obesity, rectal polyp, tubular adenoma:, Vitamin B12 deficiency who is coming to the emergency department due to shortness of breath.    PT Comments    Patient is making good progress with PT.  From a mobility standpoint anticipate patient will be ready for DC home 04/10/2018.  Patient able to ambulate further today with RW and requires less assistance as her condition improves. Patient does exhibit decreased endurance and strength for activity and is therefore at risk for falls.  Patient would continue to benefit from skilled physical therapy in current environment and next venue to continue return to prior function and increase strength, endurance, balance, coordination, and functional mobility and gait skills.     Follow Up Recommendations  Home health PT;Supervision for mobility/OOB     Equipment Recommendations  None recommended by PT    Recommendations for Other Services       Precautions / Restrictions Precautions Precautions: Fall Restrictions Weight Bearing Restrictions: No    Mobility  Bed Mobility               General bed mobility comments: Sitting at EOB upon PT entry  Transfers Overall transfer level: Needs assistance Equipment used: Rolling  walker (2 wheeled) Transfers: Sit to/from [employer] Transfers Sit to Stand: Supervision Stand pivot transfers: Supervision       General transfer comment: requires less assistance for sit to stands and transfers  Ambulation/Gait Ambulation/Gait assistance: Supervision Gait Distance (Feet): 225 Feet Assistive device: Rolling walker (2 wheeled) Gait Pattern/deviations: Decreased step length - right;Decreased step length - left;Decreased stride length Gait velocity: decreased   General Gait Details: slightly labored slow cadence without loss of balance   Stairs             Wheelchair Mobility    Modified Rankin (Stroke Patients Only)       Balance Overall balance assessment: Needs assistance Sitting-balance support: Feet supported;No upper extremity supported Sitting balance-Leahy Scale: Good     Standing balance support: Bilateral upper extremity supported;During functional activity Standing balance-Leahy Scale: Fair Standing balance comment: fair/good using RW                            Cognition Arousal/Alertness: Awake/alert Behavior During Therapy: WFL for tasks assessed/performed Overall Cognitive Status: Within Functional Limits for tasks assessed                                        Exercises General Exercises - Upper Extremity Shoulder Flexion: AROM;Strengthening;Both;10 reps;Seated General Exercises - Lower Extremity Long Arc Quad: Seated;AROM;Strengthening;Both;10 reps Hip Flexion/Marching: Seated;AROM;Strengthening;Both;10 reps Toe Raises: Seated;AROM;Strengthening;Both;10 reps Heel Raises: Seated;AROM;Strengthening;Both;10 reps    General Comments        Pertinent Vitals/Pain Pain Assessment: No/denies pain    Home Living  Prior Function            PT Goals (current goals can now be found in the care plan section) Acute Rehab PT Goals Patient Stated Goal: return  home PT Goal Formulation: With patient/family Time For Goal Achievement: 04/11/18 Potential to Achieve Goals: Good Progress towards PT goals: Progressing toward goals    Frequency    Min 3X/week      PT Plan Current plan remains appropriate    Co-evaluation              AM-PAC PT "6 Clicks" Daily Activity  Outcome Measure  Difficulty turning over in bed (including adjusting bedclothes, sheets and blankets)?: None Difficulty moving from lying on back to sitting on the side of the bed? : None Difficulty sitting down on and standing up from a chair with arms (e.g., wheelchair, bedside commode, etc,.)?: A Little Help needed moving to and from a bed to chair (including a wheelchair)?: A Little Help needed walking in hospital room?: A Little Help needed climbing 3-5 steps with a railing? : A Little 6 Click Score: 20    End of Session   Activity Tolerance: Patient tolerated treatment well;Patient limited by fatigue Patient left: in bed;with call bell/phone within reach;with family/visitor present Nurse Communication: Mobility status PT Visit Diagnosis: Unsteadiness on feet (R26.81);Other abnormalities of gait and mobility (R26.89);Muscle weakness (generalized) (M62.81)     Time: 1350-1420 PT Time Calculation (min) (ACUTE ONLY): 30 min  Charges:  $Gait Training: 8-22 mins $Therapeutic Exercise: 8-22 mins                     Floria Raveling. Hartnett-Rands, MS, PT Per [facility] #[id number] 04/10/2018, 2:14 PM

## 2018-04-10 NOTE — Progress Notes (Signed)
Subjective: She feels better with no complaints.  Her breathing is better.  She had previous episode of hemoptysis but none now.  Objective: Vital signs in last 24 hours: Temp:  [97.3 F (36.3 C)-97.8 F (36.6 C)] 97.8 F (36.6 C) (11/15 0556) Pulse Rate:  [50-89] 89 (11/15 0556) Resp:  [16] 16 (11/15 0556) BP: (125-145)/(49-76) 125/49 (11/15 0556) SpO2:  [91 %-95 %] 94 % (11/15 0759) Weight change:  Last BM Date: 04/09/18  Intake/Output from previous day: 11/14 0701 - 11/15 0700 In: 243 [P.O.:240; I.V.:3] Out: 400 [Urine:400]  PHYSICAL EXAM General appearance: alert, cooperative and no distress Resp: clear to auscultation bilaterally Cardio: regular rate and rhythm, S1, S2 normal, no murmur, click, rub or gallop GI: soft, non-tender; bowel sounds normal; no masses,  no organomegaly Extremities: extremities normal, atraumatic, no cyanosis or edema  Lab Results:  Results for orders placed or performed during the hospital encounter of 04/04/18 (from the past 48 hour(s))  Glucose, capillary     Status: Abnormal   Collection Time: 04/08/18 11:46 AM  Result Value Ref Range   Glucose-Capillary 177 (H) 70 - 99 mg/dL  Glucose, capillary     Status: Abnormal   Collection Time: 04/08/18  4:50 PM  Result Value Ref Range   Glucose-Capillary 256 (H) 70 - 99 mg/dL  Glucose, capillary     Status: Abnormal   Collection Time: 04/08/18  8:36 PM  Result Value Ref Range   Glucose-Capillary 333 (H) 70 - 99 mg/dL   Comment 1 Notify RN    Comment 2 Document in Chart   Glucose, capillary     Status: Abnormal   Collection Time: 04/09/18  7:34 AM  Result Value Ref Range   Glucose-Capillary 108 (H) 70 - 99 mg/dL  Glucose, capillary     Status: Abnormal   Collection Time: 04/09/18 11:20 AM  Result Value Ref Range   Glucose-Capillary 155 (H) 70 - 99 mg/dL  Glucose, capillary     Status: Abnormal   Collection Time: 04/09/18  4:59 PM  Result Value Ref Range   Glucose-Capillary 275 (H) 70 -  99 mg/dL  Glucose, capillary     Status: Abnormal   Collection Time: 04/09/18  9:43 PM  Result Value Ref Range   Glucose-Capillary 249 (H) 70 - 99 mg/dL   Comment 1 Notify RN    Comment 2 Document in Chart   Glucose, capillary     Status: Abnormal   Collection Time: 04/10/18  8:30 AM  Result Value Ref Range   Glucose-Capillary 121 (H) 70 - 99 mg/dL    ABGS No results for input(s): PHART, PO2ART, TCO2, HCO3 in the last 72 hours.  Invalid input(s): PCO2 CULTURES Recent Results (from the past 240 hour(s))  MRSA PCR Screening     Status: Abnormal   Collection Time: 04/04/18  6:10 AM  Result Value Ref Range Status   MRSA by PCR POSITIVE (A) NEGATIVE Final    Comment:        The GeneXpert MRSA Assay (FDA approved for NASAL specimens only), is one component of a comprehensive MRSA colonization surveillance program. It is not intended to diagnose MRSA infection nor to guide or monitor treatment for MRSA infections. RESULT CALLED TO, READ BACK BY AND VERIFIED WITH: MURPHY,E. AT 1756 ON 04/04/2018 BY EVA Performed at [facility], [address], [place], [place] [address]    Studies/Results: Dg Chest Port 1 View  Result Date: 04/09/2018 CLINICAL DATA:  Chronic obstructive pulmonary disease. EXAM:  PORTABLE CHEST 1 VIEW COMPARISON:  04/04/2018 FINDINGS: Coarse lung markings are unchanged. No focal airspace disease or pulmonary edema. Left subclavian Port-A-Cath with the tip in the upper SVC region appears stable. Please note that the port appears to be slightly rotated and could be flipped. Surgical plate in the lower cervical spine. Heart size is upper limits of normal but stable. IMPRESSION: Chronic lung changes without acute findings. Left subclavian port has not significantly changed but the port could be flipped or on its side based on this single view. Electronically Signed   By: Markus Daft M.D.   On: 04/09/2018 15:12    Medications:  Prior to Admission:  Medications Prior to  Admission  Medication Sig Dispense Refill Last Dose  . albuterol (PROVENTIL HFA;VENTOLIN HFA) 108 (90 BASE) MCG/ACT inhaler Inhale 2 puffs into the lungs every 4 (four) hours as needed for wheezing.   04/03/2018 at Unknown time  . ALPRAZolam (XANAX) 0.5 MG tablet Take 0.5 mg by mouth 2 (two) times daily as needed for anxiety or sleep.    04/03/2018 at Unknown time  . amLODipine (NORVASC) 5 MG tablet Take 1 tablet (5 mg total) by mouth daily. 30 tablet 3 04/03/2018 at Unknown time  . atorvastatin (LIPITOR) 80 MG tablet Take 1 tablet (80 mg total) by mouth daily at 6 PM. 30 tablet 12 04/03/2018 at Unknown time  . Cholecalciferol (VITAMIN D3) 5000 units CAPS Take 1 capsule (5,000 Units total) by mouth daily. 90 capsule 0 04/03/2018 at Unknown time  . Docusate Sodium 100 MG capsule Take 100 mg by mouth daily.    04/03/2018 at Unknown time  . Fluticasone-Salmeterol (ADVAIR) 250-50 MCG/DOSE AEPB Inhale 1 puff into the lungs 2 (two) times daily.   04/03/2018 at Unknown time  . gabapentin (NEURONTIN) 400 MG capsule Take 400 mg by mouth 3 (three) times daily.   04/03/2018 at Unknown time  . hydrALAZINE (APRESOLINE) 50 MG tablet Take 1 tablet (50 mg total) by mouth 3 (three) times daily. (Patient taking differently: Take 50 mg by mouth 2 (two) times daily. ) 120 tablet 12 04/03/2018 at Unknown time  . Insulin Detemir (LEVEMIR FLEXTOUCH) 100 UNIT/ML Pen Inject 60 Units into the skin at bedtime. 15 pen 2 04/03/2018 at Unknown time  . Insulin Pen Needle (B-D ULTRAFINE III SHORT PEN) 31G X 8 MM MISC Use  1 daily at bedtime to reject insulin. 100 each 1 04/03/2018 at Unknown time  . Lancets MISC Used 2 daily 100 each 3 04/03/2018 at Unknown time  . levothyroxine (SYNTHROID, LEVOTHROID) 200 MCG tablet Take 200 mcg by mouth daily before breakfast.    04/03/2018 at Unknown time  . metFORMIN (GLUCOPHAGE) 500 MG tablet Take 500 mg by mouth 2 (two) times daily.   04/03/2018 at Unknown time  . mirabegron ER (MYRBETRIQ) 50 MG TB24  tablet Take 50 mg by mouth daily.   04/03/2018 at Unknown time  . nitroGLYCERIN (NITROSTAT) 0.4 MG SL tablet Place 1 tablet (0.4 mg total) under the tongue every 5 (five) minutes as needed for chest pain.  12 unknown  . pantoprazole (PROTONIX) 40 MG tablet 1 PO 30 MINUTES PRIOR TO MEALS BID (Patient taking differently: Take 40 mg by mouth 2 (two) times daily. ) 60 tablet 11 04/03/2018 at Unknown time  . predniSONE (DELTASONE) 10 MG tablet Take 10 mg by mouth daily with breakfast.   04/03/2018 at Unknown time  . sertraline (ZOLOFT) 100 MG tablet Take 0.5 tablets (50 mg total) by mouth  at bedtime. (Patient taking differently: Take 100 mg by mouth at bedtime. ) 30 tablet 11 04/03/2018 at Unknown time  . solifenacin (VESICARE) 10 MG tablet Take 10 mg by mouth daily.    04/03/2018 at Unknown time  . traMADol (ULTRAM) 50 MG tablet Take by mouth every 6 (six) hours as needed for moderate pain or severe pain.    Past Month at Unknown time  . vitamin B-12 1000 MCG tablet Take 1 tablet (1,000 mcg total) by mouth daily.   04/03/2018 at Unknown time  . Carboxymethylcellulose Sod PF (REFRESH PLUS) 0.5 % SOLN Place 1 drop into both eyes daily as needed (for dry eye relief).    unknown  . levofloxacin (LEVAQUIN) 500 MG tablet Take 500 mg by mouth daily.   0 Completed Course at Unknown time  . methylPREDNISolone (MEDROL DOSEPAK) 4 MG TBPK tablet Take 4-24 mg by mouth See admin instructions. Take as directed per package instructions (6,5,4,3,2,1)  0 Completed Course at Unknown time   Scheduled: . ALPRAZolam  0.5 mg Oral BID  . amLODipine  5 mg Oral Daily  . atorvastatin  80 mg Oral q1800  . carvedilol  6.25 mg Oral BID WC  . docusate sodium  100 mg Oral BID  . fluticasone furoate-vilanterol  1 puff Inhalation Daily  . gabapentin  400 mg Oral TID  . guaiFENesin  1,200 mg Oral BID  . heparin injection (subcutaneous)  5,000 Units Subcutaneous Q8H  . insulin aspart  0-20 Units Subcutaneous TID WC  . insulin aspart  0-5  Units Subcutaneous QHS  . insulin aspart  4 Units Subcutaneous TID WC  . insulin glargine  20 Units Subcutaneous QHS  . ipratropium-albuterol  3 mL Nebulization Q6H  . levothyroxine  200 mcg Oral QAC breakfast  . linaclotide  72 mcg Oral QAC breakfast  . loratadine  10 mg Oral Daily  . pantoprazole  40 mg Oral QAC breakfast  . predniSONE  40 mg Oral Q breakfast  . sertraline  50 mg Oral QHS  . sodium chloride flush  3 mL Intravenous Q12H  . cyanocobalamin  1,000 mcg Oral Daily   Continuous: . cefTRIAXone (ROCEPHIN)  IV 1 g (04/09/18 1143)   YYQ:[id number] **OR** acetaminophen, albuterol, benzonatate, guaiFENesin, nitroGLYCERIN  Assesment: She was admitted with COPD exacerbation and she is slowly improving.  She had a bad night night before last and a fairly bad day yesterday but feels much better today.  She has diabetes on insulin which is pretty well controlled  She has coronary artery occlusive disease stable  She has chronic adrenal insufficiency on chronic steroid treatment. Principal Problem:   COPD exacerbation ([name]) Active Problems:   Acquired hypothyroidism   Essential hypertension   Coronary atherosclerosis   Chronic diastolic heart failure ([facility])   GERD (gastroesophageal reflux disease)   Diabetes mellitus, type II, insulin dependent (HCC)   PAF (paroxysmal atrial fibrillation) ([facility])   Prolonged QT interval    Plan: Continue meds.  Probable discharge in the morning.  She has improved now.    LOS: 5 days   Marylin Lathon L 04/10/2018, 8:50 AM

## 2018-04-11 LAB — GLUCOSE, CAPILLARY: Glucose-Capillary: 130 mg/dL — ABNORMAL HIGH (ref 70–99)

## 2018-04-11 MED ORDER — PREDNISONE 10 MG (21) PO TBPK: ORAL_TABLET | ORAL | 0 refills | Status: DC

## 2018-04-11 MED ORDER — CEFDINIR 300 MG PO CAPS: 300.0000 mg | ORAL_CAPSULE | Freq: Two times a day (BID) | ORAL | 0 refills | Status: DC

## 2018-04-11 MED ORDER — IPRATROPIUM-ALBUTEROL 0.5-2.5 (3) MG/3ML IN SOLN
3.0000 mL | Freq: Four times a day (QID) | RESPIRATORY_TRACT | Status: DC
  Administered 2018-04-11: 3 mL via RESPIRATORY_TRACT
  Filled 2018-04-11 (×2): qty 3

## 2018-04-11 NOTE — Care Management Note (Signed)
Case Management Note  Patient Details  Name: Ann [name] MRN: [id number] Date of Birth: Jun 09, 1945  Subjective/Objective:  Patient to be discharged per MD order. Orders in place for home health services. Previous RNCM began workup for home health via [facility]. Patient prefers to continue with this plan. Notified Jermaine of referral. No DME needs. Family to transport.                    Action/Plan:   Expected Discharge Date:  04/11/18               Expected Discharge Plan:  [place]  In-House Referral:     Discharge planning Services  CM Consult  Post Acute Care Choice:  Home Health Choice offered to:  Patient  DME Arranged:    DME Agency:     HH Arranged:  PT [place]:  [place]  Status of Service:  Completed, signed off  If discussed at Minor of Stay Meetings, dates discussed:    Additional Comments:  Suzy Kugel Loni Muse Marcianne Ozbun, RN 04/11/2018, 11:12 AM

## 2018-04-11 NOTE — Progress Notes (Signed)
IV removed, WNL. D/C instructions given to pt and spouse, verbalized understanding. Pt spouse at bedside to transport home.

## 2018-04-11 NOTE — Discharge Instructions (Signed)

## 2018-04-11 NOTE — Progress Notes (Signed)
Subjective: She says she feels very well and wants to go home.  Her oxygenation is better.  She is been able to ambulate in the halls.  Objective: Vital signs in last 24 hours: Temp:  [97.8 F (36.6 C)-98.5 F (36.9 C)] 97.8 F (36.6 C) (11/16 0631) Pulse Rate:  [58-67] 58 (11/16 0631) Resp:  [18-20] 18 (11/16 0631) BP: (110-148)/(61-94) 110/94 (11/16 0631) SpO2:  [92 %-95 %] 93 % (11/16 0631) Weight change:  Last BM Date: 04/09/18  Intake/Output from previous day: 11/15 0701 - 11/16 0700 In: 730 [P.O.:730] Out: 2000 [Urine:2000]  PHYSICAL EXAM General appearance: alert, cooperative, no distress and morbidly obese Resp: Decreased breath sounds but no wheezing Cardio: regular rate and rhythm, S1, S2 normal, no murmur, click, rub or gallop GI: soft, non-tender; bowel sounds normal; no masses,  no organomegaly Extremities: extremities normal, atraumatic, no cyanosis or edema  Lab Results:  Results for orders placed or performed during the hospital encounter of 04/04/18 (from the past 48 hour(s))  Glucose, capillary     Status: Abnormal   Collection Time: 04/09/18 11:20 AM  Result Value Ref Range   Glucose-Capillary 155 (H) 70 - 99 mg/dL  Glucose, capillary     Status: Abnormal   Collection Time: 04/09/18  4:59 PM  Result Value Ref Range   Glucose-Capillary 275 (H) 70 - 99 mg/dL  Glucose, capillary     Status: Abnormal   Collection Time: 04/09/18  9:43 PM  Result Value Ref Range   Glucose-Capillary 249 (H) 70 - 99 mg/dL   Comment 1 Notify RN    Comment 2 Document in Chart   Glucose, capillary     Status: Abnormal   Collection Time: 04/10/18  8:30 AM  Result Value Ref Range   Glucose-Capillary 121 (H) 70 - 99 mg/dL  Glucose, capillary     Status: Abnormal   Collection Time: 04/10/18 11:27 AM  Result Value Ref Range   Glucose-Capillary 159 (H) 70 - 99 mg/dL  Glucose, capillary     Status: Abnormal   Collection Time: 04/10/18  4:49 PM  Result Value Ref Range   Glucose-Capillary 260 (H) 70 - 99 mg/dL  Glucose, capillary     Status: Abnormal   Collection Time: 04/10/18  9:52 PM  Result Value Ref Range   Glucose-Capillary 244 (H) 70 - 99 mg/dL   Comment 1 Notify RN    Comment 2 Document in Chart   Glucose, capillary     Status: Abnormal   Collection Time: 04/11/18  8:01 AM  Result Value Ref Range   Glucose-Capillary 130 (H) 70 - 99 mg/dL    ABGS No results for input(s): PHART, PO2ART, TCO2, HCO3 in the last 72 hours.  Invalid input(s): PCO2 CULTURES Recent Results (from the past 240 hour(s))  MRSA PCR Screening     Status: Abnormal   Collection Time: 04/04/18  6:10 AM  Result Value Ref Range Status   MRSA by PCR POSITIVE (A) NEGATIVE Final    Comment:        The GeneXpert MRSA Assay (FDA approved for NASAL specimens only), is one component of a comprehensive MRSA colonization surveillance program. It is not intended to diagnose MRSA infection nor to guide or monitor treatment for MRSA infections. RESULT CALLED TO, READ BACK BY AND VERIFIED WITH: MURPHY,E. AT 1756 ON 04/04/2018 BY EVA Performed at [facility], [address], [place], [place] [address]    Studies/Results: Dg Chest Port 1 View  Result Date: 04/09/2018 CLINICAL  DATA:  Chronic obstructive pulmonary disease. EXAM: PORTABLE CHEST 1 VIEW COMPARISON:  04/04/2018 FINDINGS: Coarse lung markings are unchanged. No focal airspace disease or pulmonary edema. Left subclavian Port-A-Cath with the tip in the upper SVC region appears stable. Please note that the port appears to be slightly rotated and could be flipped. Surgical plate in the lower cervical spine. Heart size is upper limits of normal but stable. IMPRESSION: Chronic lung changes without acute findings. Left subclavian port has not significantly changed but the port could be flipped or on its side based on this single view. Electronically Signed   By: Markus Daft M.D.   On: 04/09/2018 15:12    Medications:  Prior to  Admission:  Medications Prior to Admission  Medication Sig Dispense Refill Last Dose  . albuterol (PROVENTIL HFA;VENTOLIN HFA) 108 (90 BASE) MCG/ACT inhaler Inhale 2 puffs into the lungs every 4 (four) hours as needed for wheezing.   04/03/2018 at Unknown time  . ALPRAZolam (XANAX) 0.5 MG tablet Take 0.5 mg by mouth 2 (two) times daily as needed for anxiety or sleep.    04/03/2018 at Unknown time  . amLODipine (NORVASC) 5 MG tablet Take 1 tablet (5 mg total) by mouth daily. 30 tablet 3 04/03/2018 at Unknown time  . atorvastatin (LIPITOR) 80 MG tablet Take 1 tablet (80 mg total) by mouth daily at 6 PM. 30 tablet 12 04/03/2018 at Unknown time  . Cholecalciferol (VITAMIN D3) 5000 units CAPS Take 1 capsule (5,000 Units total) by mouth daily. 90 capsule 0 04/03/2018 at Unknown time  . Docusate Sodium 100 MG capsule Take 100 mg by mouth daily.    04/03/2018 at Unknown time  . Fluticasone-Salmeterol (ADVAIR) 250-50 MCG/DOSE AEPB Inhale 1 puff into the lungs 2 (two) times daily.   04/03/2018 at Unknown time  . gabapentin (NEURONTIN) 400 MG capsule Take 400 mg by mouth 3 (three) times daily.   04/03/2018 at Unknown time  . hydrALAZINE (APRESOLINE) 50 MG tablet Take 1 tablet (50 mg total) by mouth 3 (three) times daily. (Patient taking differently: Take 50 mg by mouth 2 (two) times daily. ) 120 tablet 12 04/03/2018 at Unknown time  . Insulin Detemir (LEVEMIR FLEXTOUCH) 100 UNIT/ML Pen Inject 60 Units into the skin at bedtime. 15 pen 2 04/03/2018 at Unknown time  . Insulin Pen Needle (B-D ULTRAFINE III SHORT PEN) 31G X 8 MM MISC Use  1 daily at bedtime to reject insulin. 100 each 1 04/03/2018 at Unknown time  . Lancets MISC Used 2 daily 100 each 3 04/03/2018 at Unknown time  . levothyroxine (SYNTHROID, LEVOTHROID) 200 MCG tablet Take 200 mcg by mouth daily before breakfast.    04/03/2018 at Unknown time  . metFORMIN (GLUCOPHAGE) 500 MG tablet Take 500 mg by mouth 2 (two) times daily.   04/03/2018 at Unknown time  .  mirabegron ER (MYRBETRIQ) 50 MG TB24 tablet Take 50 mg by mouth daily.   04/03/2018 at Unknown time  . nitroGLYCERIN (NITROSTAT) 0.4 MG SL tablet Place 1 tablet (0.4 mg total) under the tongue every 5 (five) minutes as needed for chest pain.  12 unknown  . pantoprazole (PROTONIX) 40 MG tablet 1 PO 30 MINUTES PRIOR TO MEALS BID (Patient taking differently: Take 40 mg by mouth 2 (two) times daily. ) 60 tablet 11 04/03/2018 at Unknown time  . predniSONE (DELTASONE) 10 MG tablet Take 10 mg by mouth daily with breakfast.   04/03/2018 at Unknown time  . sertraline (ZOLOFT) 100 MG tablet Take  0.5 tablets (50 mg total) by mouth at bedtime. (Patient taking differently: Take 100 mg by mouth at bedtime. ) 30 tablet 11 04/03/2018 at Unknown time  . solifenacin (VESICARE) 10 MG tablet Take 10 mg by mouth daily.    04/03/2018 at Unknown time  . traMADol (ULTRAM) 50 MG tablet Take by mouth every 6 (six) hours as needed for moderate pain or severe pain.    Past Month at Unknown time  . vitamin B-12 1000 MCG tablet Take 1 tablet (1,000 mcg total) by mouth daily.   04/03/2018 at Unknown time  . Carboxymethylcellulose Sod PF (REFRESH PLUS) 0.5 % SOLN Place 1 drop into both eyes daily as needed (for dry eye relief).    unknown  . levofloxacin (LEVAQUIN) 500 MG tablet Take 500 mg by mouth daily.   0 Completed Course at Unknown time  . methylPREDNISolone (MEDROL DOSEPAK) 4 MG TBPK tablet Take 4-24 mg by mouth See admin instructions. Take as directed per package instructions (6,5,4,3,2,1)  0 Completed Course at Unknown time   Scheduled: . ALPRAZolam  0.5 mg Oral BID  . amLODipine  5 mg Oral Daily  . atorvastatin  80 mg Oral q1800  . carvedilol  6.25 mg Oral BID WC  . cefdinir  300 mg Oral Q12H  . docusate sodium  100 mg Oral BID  . fluticasone furoate-vilanterol  1 puff Inhalation Daily  . gabapentin  400 mg Oral TID  . guaiFENesin  1,200 mg Oral BID  . heparin injection (subcutaneous)  5,000 Units Subcutaneous Q8H  .  insulin aspart  0-20 Units Subcutaneous TID WC  . insulin aspart  0-5 Units Subcutaneous QHS  . insulin aspart  4 Units Subcutaneous TID WC  . insulin glargine  20 Units Subcutaneous QHS  . ipratropium-albuterol  3 mL Nebulization Q6H WA  . levothyroxine  200 mcg Oral QAC breakfast  . linaclotide  72 mcg Oral QAC breakfast  . loratadine  10 mg Oral Daily  . pantoprazole  40 mg Oral QAC breakfast  . predniSONE  40 mg Oral Q breakfast  . sertraline  50 mg Oral QHS  . sodium chloride flush  3 mL Intravenous Q12H  . cyanocobalamin  1,000 mcg Oral Daily   Continuous:  HDQ:[id number] **OR** acetaminophen, albuterol, benzonatate, guaiFENesin, nitroGLYCERIN  Assesment: She was admitted with COPD exacerbation.  She has had baseline COPD, coronary disease which is stable, chronic diastolic heart failure which is stable and diabetes.  She also has chronic adrenal insufficiency.  She is back to baseline and ready for discharge Principal Problem:   COPD exacerbation ([place]) Active Problems:   Acquired hypothyroidism   Essential hypertension   Coronary atherosclerosis   Chronic diastolic heart failure ([facility])   GERD (gastroesophageal reflux disease)   Diabetes mellitus, type II, insulin dependent (HCC)   PAF (paroxysmal atrial fibrillation) ([facility])   Prolonged QT interval    Plan: Discharge home today    LOS: 6 days   Chinelo Benn L 04/11/2018, 10:57 AM

## 2018-04-11 NOTE — Discharge Summary (Signed)
Physician Discharge Summary  Patient ID: Ann [name] MRN: [id number] DOB/AGE: 12/29/1945 72 y.o. Primary Care Physician:Romelia Bromell, Percell Miller, MD Admit date: 04/04/2018 Discharge date: 04/11/2018    Discharge Diagnoses:   Principal Problem:   COPD exacerbation ([place]) Active Problems:   Acquired hypothyroidism   Essential hypertension   Coronary atherosclerosis   Chronic diastolic heart failure (HCC)   GERD (gastroesophageal reflux disease)   Diabetes mellitus, type II, insulin dependent (HCC)   PAF (paroxysmal atrial fibrillation) (HCC)   Prolonged QT interval Adrenal insufficiency  Allergies as of 04/11/2018      Reactions   Ace Inhibitors    Asa [aspirin]    Causes bleeding   Tape Other (See Comments)   Skin tearing, causes scars   Niacin Rash   Reglan [metoclopramide] Anxiety      Medication List    STOP taking these medications   levofloxacin 500 MG tablet Commonly known as:  LEVAQUIN   methylPREDNISolone 4 MG Tbpk tablet Commonly known as:  MEDROL DOSEPAK     TAKE these medications   albuterol 108 (90 Base) MCG/ACT inhaler Commonly known as:  PROVENTIL HFA;VENTOLIN HFA Inhale 2 puffs into the lungs every 4 (four) hours as needed for wheezing.   ALPRAZolam 0.5 MG tablet Commonly known as:  XANAX Take 0.5 mg by mouth 2 (two) times daily as needed for anxiety or sleep.   amLODipine 5 MG tablet Commonly known as:  NORVASC Take 1 tablet (5 mg total) by mouth daily.   atorvastatin 80 MG tablet Commonly known as:  LIPITOR Take 1 tablet (80 mg total) by mouth daily at 6 PM.   cefdinir 300 MG capsule Commonly known as:  OMNICEF Take 1 capsule (300 mg total) by mouth every 12 (twelve) hours.   cyanocobalamin 1000 MCG tablet Take 1 tablet (1,000 mcg total) by mouth daily.   Docusate Sodium 100 MG capsule Take 100 mg by mouth daily.   Fluticasone-Salmeterol 250-50 MCG/DOSE Aepb Commonly known as:  ADVAIR Inhale 1 puff into the lungs 2 (two) times  daily.   gabapentin 400 MG capsule Commonly known as:  NEURONTIN Take 400 mg by mouth 3 (three) times daily.   hydrALAZINE 50 MG tablet Commonly known as:  APRESOLINE Take 1 tablet (50 mg total) by mouth 3 (three) times daily. What changed:  when to take this   Insulin Detemir 100 UNIT/ML Pen Commonly known as:  LEVEMIR Inject 60 Units into the skin at bedtime.   Insulin Pen Needle 31G X 8 MM Misc Use  1 daily at bedtime to reject insulin.   Lancets Misc Used 2 daily   levothyroxine 200 MCG tablet Commonly known as:  SYNTHROID, LEVOTHROID Take 200 mcg by mouth daily before breakfast.   metFORMIN 500 MG tablet Commonly known as:  GLUCOPHAGE Take 500 mg by mouth 2 (two) times daily.   MYRBETRIQ 50 MG Tb24 tablet Generic drug:  mirabegron ER Take 50 mg by mouth daily.   nitroGLYCERIN 0.4 MG SL tablet Commonly known as:  NITROSTAT Place 1 tablet (0.4 mg total) under the tongue every 5 (five) minutes as needed for chest pain.   pantoprazole 40 MG tablet Commonly known as:  PROTONIX 1 PO 30 MINUTES PRIOR TO MEALS BID What changed:    how much to take  how to take this  when to take this  additional instructions   predniSONE 10 MG tablet Commonly known as:  DELTASONE Take 10 mg by mouth daily with breakfast. What changed:  Another  medication with the same name was added. Make sure you understand how and when to take each.   predniSONE 10 MG (21) Tbpk tablet Commonly known as:  STERAPRED UNI-PAK 21 TAB Take by package directions What changed:  You were already taking a medication with the same name, and this prescription was added. Make sure you understand how and when to take each.   REFRESH PLUS 0.5 % Soln Generic drug:  Carboxymethylcellulose Sod PF Place 1 drop into both eyes daily as needed (for dry eye relief).   sertraline 100 MG tablet Commonly known as:  ZOLOFT Take 0.5 tablets (50 mg total) by mouth at bedtime. What changed:  how much to take    solifenacin 10 MG tablet Commonly known as:  VESICARE Take 10 mg by mouth daily.   traMADol 50 MG tablet Commonly known as:  ULTRAM Take by mouth every 6 (six) hours as needed for moderate pain or severe pain.   Vitamin D3 125 MCG (5000 UT) Caps Take 1 capsule (5,000 Units total) by mouth daily.       Discharged Condition: Improved    Consults: None  Significant Diagnostic Studies: Dg Chest Port 1 View  Result Date: 04/09/2018 CLINICAL DATA:  Chronic obstructive pulmonary disease. EXAM: PORTABLE CHEST 1 VIEW COMPARISON:  04/04/2018 FINDINGS: Coarse lung markings are unchanged. No focal airspace disease or pulmonary edema. Left subclavian Port-A-Cath with the tip in the upper SVC region appears stable. Please note that the port appears to be slightly rotated and could be flipped. Surgical plate in the lower cervical spine. Heart size is upper limits of normal but stable. IMPRESSION: Chronic lung changes without acute findings. Left subclavian port has not significantly changed but the port could be flipped or on its side based on this single view. Electronically Signed   By: Markus Daft M.D.   On: 04/09/2018 15:12   Dg Chest Port 1 View  Result Date: 04/04/2018 CLINICAL DATA:  Shortness of breath and productive cough for the last few days. EXAM: PORTABLE CHEST 1 VIEW COMPARISON:  07/02/2017 FINDINGS: Limited examination due to body habitus, underpenetration and AP projection. Heart is mildly enlarged but stable. There is tortuosity and calcification of the thoracic aorta. The power port is stable. Streaky scarring changes but no obvious infiltrates, edema or effusions. IMPRESSION: Limited examination. Streaky basilar atelectasis but no definite infiltrates or effusions. Electronically Signed   By: Marijo Sanes M.D.   On: 04/04/2018 02:52    Lab Results: Basic Metabolic Panel: No results for input(s): NA, K, CL, CO2, GLUCOSE, BUN, CREATININE, CALCIUM, MG, PHOS in the last 72  hours. Liver Function Tests: No results for input(s): AST, ALT, ALKPHOS, BILITOT, PROT, ALBUMIN in the last 72 hours.   CBC: No results for input(s): WBC, NEUTROABS, HGB, HCT, MCV, PLT in the last 72 hours.  Recent Results (from the past 240 hour(s))  MRSA PCR Screening     Status: Abnormal   Collection Time: 04/04/18  6:10 AM  Result Value Ref Range Status   MRSA by PCR POSITIVE (A) NEGATIVE Final    Comment:        The GeneXpert MRSA Assay (FDA approved for NASAL specimens only), is one component of a comprehensive MRSA colonization surveillance program. It is not intended to diagnose MRSA infection nor to guide or monitor treatment for MRSA infections. RESULT CALLED TO, READ BACK BY AND VERIFIED WITH: MURPHY,E. AT 1756 ON 04/04/2018 BY EVA Performed at [facility], [address], [place],  [place] [address]      Hospital Course: This is a 72 year old who came to the emergency department because of increasing shortness of breath.  She was found to have COPD exacerbation and was found to be hypoxic.  She was treated with intravenous antibiotics IV steroids inhaled bronchodilators and oxygen.  She improved over the next several days but continued symptomatic.  Eventually she got back to baseline her oxygenation improved she was able to ambulate in the halls and was ready for discharge  Discharge Exam: Blood pressure (!) 110/94, pulse (!) 58, temperature 97.8 F (36.6 C), temperature source Oral, resp. rate 18, height 5\' 3"  (1.6 m), weight 123.8 kg, SpO2 93 %. She is awake and alert.  Chest is clear.  Heart is regular.  She has diminished breath sounds bilaterally  Disposition: Home with home health services  Discharge Instructions    Face-to-face encounter (required for Medicare/Medicaid patients)   Complete by:  As directed    I Shadiamond Koska L certify that this patient is under my care and that I, or a nurse practitioner or physician's assistant working with me, had a  face-to-face encounter that meets the physician face-to-face encounter requirements with this patient on 04/11/2018. The encounter with the patient was in whole, or in part for the following medical condition(s) which is the primary reason for home health care (List medical condition): copd exacerbation   The encounter with the patient was in whole, or in part, for the following medical condition, which is the primary reason for home health care:  copd exacerbation   I certify that, based on my findings, the following services are medically necessary home health services:   Nursing Physical therapy     Reason for Medically Necessary Home Health Services:  Skilled Nursing- Change/Decline in Patient Status   My clinical findings support the need for the above services:  Unable to leave home safely without assistance and/or assistive device   Further, I certify that my clinical findings support that this patient is homebound due to:  Unable to leave home safely without assistance   Home Health   Complete by:  As directed    To provide the following care/treatments:   PT RN        Follow-up Information    Sinda Du, MD Follow up in 1 week(s).   Specialty:  Pulmonary Disease Contact information: [address] [place] [address] [phone]           Signed: Xaine Sansom L   04/11/2018, 10:59 AM

## 2018-04-12 DIAGNOSIS — I2510 Atherosclerotic heart disease of native coronary artery without angina pectoris: Secondary | ICD-10-CM | POA: Diagnosis not present

## 2018-04-12 DIAGNOSIS — M1990 Unspecified osteoarthritis, unspecified site: Secondary | ICD-10-CM | POA: Diagnosis not present

## 2018-04-12 DIAGNOSIS — N189 Chronic kidney disease, unspecified: Secondary | ICD-10-CM | POA: Diagnosis not present

## 2018-04-12 DIAGNOSIS — Z87828 Personal history of other (healed) physical injury and trauma: Secondary | ICD-10-CM | POA: Diagnosis not present

## 2018-04-12 DIAGNOSIS — I4581 Long QT syndrome: Secondary | ICD-10-CM | POA: Diagnosis not present

## 2018-04-12 DIAGNOSIS — I480 Paroxysmal atrial fibrillation: Secondary | ICD-10-CM | POA: Diagnosis not present

## 2018-04-12 DIAGNOSIS — Z955 Presence of coronary angioplasty implant and graft: Secondary | ICD-10-CM | POA: Diagnosis not present

## 2018-04-12 DIAGNOSIS — Z86718 Personal history of other venous thrombosis and embolism: Secondary | ICD-10-CM | POA: Diagnosis not present

## 2018-04-12 DIAGNOSIS — Z7951 Long term (current) use of inhaled steroids: Secondary | ICD-10-CM | POA: Diagnosis not present

## 2018-04-12 DIAGNOSIS — Z6841 Body Mass Index (BMI) 40.0 and over, adult: Secondary | ICD-10-CM | POA: Diagnosis not present

## 2018-04-12 DIAGNOSIS — J441 Chronic obstructive pulmonary disease with (acute) exacerbation: Secondary | ICD-10-CM | POA: Diagnosis not present

## 2018-04-12 DIAGNOSIS — I130 Hypertensive heart and chronic kidney disease with heart failure and stage 1 through stage 4 chronic kidney disease, or unspecified chronic kidney disease: Secondary | ICD-10-CM | POA: Diagnosis not present

## 2018-04-12 DIAGNOSIS — E1142 Type 2 diabetes mellitus with diabetic polyneuropathy: Secondary | ICD-10-CM | POA: Diagnosis not present

## 2018-04-12 DIAGNOSIS — E1122 Type 2 diabetes mellitus with diabetic chronic kidney disease: Secondary | ICD-10-CM | POA: Diagnosis not present

## 2018-04-12 DIAGNOSIS — I5032 Chronic diastolic (congestive) heart failure: Secondary | ICD-10-CM | POA: Diagnosis not present

## 2018-04-12 DIAGNOSIS — Z794 Long term (current) use of insulin: Secondary | ICD-10-CM | POA: Diagnosis not present

## 2018-04-12 DIAGNOSIS — Z87891 Personal history of nicotine dependence: Secondary | ICD-10-CM | POA: Diagnosis not present

## 2018-04-14 DIAGNOSIS — I2510 Atherosclerotic heart disease of native coronary artery without angina pectoris: Secondary | ICD-10-CM | POA: Diagnosis not present

## 2018-04-14 DIAGNOSIS — I5032 Chronic diastolic (congestive) heart failure: Secondary | ICD-10-CM | POA: Diagnosis not present

## 2018-04-14 DIAGNOSIS — E1122 Type 2 diabetes mellitus with diabetic chronic kidney disease: Secondary | ICD-10-CM | POA: Diagnosis not present

## 2018-04-14 DIAGNOSIS — I480 Paroxysmal atrial fibrillation: Secondary | ICD-10-CM | POA: Diagnosis not present

## 2018-04-14 DIAGNOSIS — I130 Hypertensive heart and chronic kidney disease with heart failure and stage 1 through stage 4 chronic kidney disease, or unspecified chronic kidney disease: Secondary | ICD-10-CM | POA: Diagnosis not present

## 2018-04-14 DIAGNOSIS — J441 Chronic obstructive pulmonary disease with (acute) exacerbation: Secondary | ICD-10-CM | POA: Diagnosis not present

## 2018-04-16 DIAGNOSIS — I130 Hypertensive heart and chronic kidney disease with heart failure and stage 1 through stage 4 chronic kidney disease, or unspecified chronic kidney disease: Secondary | ICD-10-CM | POA: Diagnosis not present

## 2018-04-16 DIAGNOSIS — I480 Paroxysmal atrial fibrillation: Secondary | ICD-10-CM | POA: Diagnosis not present

## 2018-04-16 DIAGNOSIS — I2510 Atherosclerotic heart disease of native coronary artery without angina pectoris: Secondary | ICD-10-CM | POA: Diagnosis not present

## 2018-04-16 DIAGNOSIS — E1122 Type 2 diabetes mellitus with diabetic chronic kidney disease: Secondary | ICD-10-CM | POA: Diagnosis not present

## 2018-04-16 DIAGNOSIS — J441 Chronic obstructive pulmonary disease with (acute) exacerbation: Secondary | ICD-10-CM | POA: Diagnosis not present

## 2018-04-16 DIAGNOSIS — I5032 Chronic diastolic (congestive) heart failure: Secondary | ICD-10-CM | POA: Diagnosis not present

## 2018-04-17 DIAGNOSIS — I480 Paroxysmal atrial fibrillation: Secondary | ICD-10-CM | POA: Diagnosis not present

## 2018-04-17 DIAGNOSIS — I2510 Atherosclerotic heart disease of native coronary artery without angina pectoris: Secondary | ICD-10-CM | POA: Diagnosis not present

## 2018-04-17 DIAGNOSIS — J441 Chronic obstructive pulmonary disease with (acute) exacerbation: Secondary | ICD-10-CM | POA: Diagnosis not present

## 2018-04-17 DIAGNOSIS — E1122 Type 2 diabetes mellitus with diabetic chronic kidney disease: Secondary | ICD-10-CM | POA: Diagnosis not present

## 2018-04-17 DIAGNOSIS — I130 Hypertensive heart and chronic kidney disease with heart failure and stage 1 through stage 4 chronic kidney disease, or unspecified chronic kidney disease: Secondary | ICD-10-CM | POA: Diagnosis not present

## 2018-04-17 DIAGNOSIS — I5032 Chronic diastolic (congestive) heart failure: Secondary | ICD-10-CM | POA: Diagnosis not present

## 2018-04-20 DIAGNOSIS — I5032 Chronic diastolic (congestive) heart failure: Secondary | ICD-10-CM | POA: Diagnosis not present

## 2018-04-20 DIAGNOSIS — I480 Paroxysmal atrial fibrillation: Secondary | ICD-10-CM | POA: Diagnosis not present

## 2018-04-20 DIAGNOSIS — I130 Hypertensive heart and chronic kidney disease with heart failure and stage 1 through stage 4 chronic kidney disease, or unspecified chronic kidney disease: Secondary | ICD-10-CM | POA: Diagnosis not present

## 2018-04-20 DIAGNOSIS — E1122 Type 2 diabetes mellitus with diabetic chronic kidney disease: Secondary | ICD-10-CM | POA: Diagnosis not present

## 2018-04-20 DIAGNOSIS — J441 Chronic obstructive pulmonary disease with (acute) exacerbation: Secondary | ICD-10-CM | POA: Diagnosis not present

## 2018-04-20 DIAGNOSIS — I2510 Atherosclerotic heart disease of native coronary artery without angina pectoris: Secondary | ICD-10-CM | POA: Diagnosis not present

## 2018-04-21 DIAGNOSIS — E1122 Type 2 diabetes mellitus with diabetic chronic kidney disease: Secondary | ICD-10-CM | POA: Diagnosis not present

## 2018-04-21 DIAGNOSIS — J441 Chronic obstructive pulmonary disease with (acute) exacerbation: Secondary | ICD-10-CM | POA: Diagnosis not present

## 2018-04-21 DIAGNOSIS — I480 Paroxysmal atrial fibrillation: Secondary | ICD-10-CM | POA: Diagnosis not present

## 2018-04-21 DIAGNOSIS — I130 Hypertensive heart and chronic kidney disease with heart failure and stage 1 through stage 4 chronic kidney disease, or unspecified chronic kidney disease: Secondary | ICD-10-CM | POA: Diagnosis not present

## 2018-04-21 DIAGNOSIS — I5032 Chronic diastolic (congestive) heart failure: Secondary | ICD-10-CM | POA: Diagnosis not present

## 2018-04-21 DIAGNOSIS — I2510 Atherosclerotic heart disease of native coronary artery without angina pectoris: Secondary | ICD-10-CM | POA: Diagnosis not present

## 2018-04-22 DIAGNOSIS — J441 Chronic obstructive pulmonary disease with (acute) exacerbation: Secondary | ICD-10-CM | POA: Diagnosis not present

## 2018-04-22 DIAGNOSIS — I5032 Chronic diastolic (congestive) heart failure: Secondary | ICD-10-CM | POA: Diagnosis not present

## 2018-04-22 DIAGNOSIS — E1122 Type 2 diabetes mellitus with diabetic chronic kidney disease: Secondary | ICD-10-CM | POA: Diagnosis not present

## 2018-04-22 DIAGNOSIS — I2510 Atherosclerotic heart disease of native coronary artery without angina pectoris: Secondary | ICD-10-CM | POA: Diagnosis not present

## 2018-04-22 DIAGNOSIS — I480 Paroxysmal atrial fibrillation: Secondary | ICD-10-CM | POA: Diagnosis not present

## 2018-04-22 DIAGNOSIS — I130 Hypertensive heart and chronic kidney disease with heart failure and stage 1 through stage 4 chronic kidney disease, or unspecified chronic kidney disease: Secondary | ICD-10-CM | POA: Diagnosis not present

## 2018-04-27 DIAGNOSIS — I480 Paroxysmal atrial fibrillation: Secondary | ICD-10-CM | POA: Diagnosis not present

## 2018-04-27 DIAGNOSIS — I130 Hypertensive heart and chronic kidney disease with heart failure and stage 1 through stage 4 chronic kidney disease, or unspecified chronic kidney disease: Secondary | ICD-10-CM | POA: Diagnosis not present

## 2018-04-27 DIAGNOSIS — I2510 Atherosclerotic heart disease of native coronary artery without angina pectoris: Secondary | ICD-10-CM | POA: Diagnosis not present

## 2018-04-27 DIAGNOSIS — E1122 Type 2 diabetes mellitus with diabetic chronic kidney disease: Secondary | ICD-10-CM | POA: Diagnosis not present

## 2018-04-27 DIAGNOSIS — I5032 Chronic diastolic (congestive) heart failure: Secondary | ICD-10-CM | POA: Diagnosis not present

## 2018-04-27 DIAGNOSIS — J441 Chronic obstructive pulmonary disease with (acute) exacerbation: Secondary | ICD-10-CM | POA: Diagnosis not present

## 2018-04-29 ENCOUNTER — Ambulatory Visit (INDEPENDENT_AMBULATORY_CARE_PROVIDER_SITE_OTHER): Payer: Medicare Other | Admitting: Urology

## 2018-04-29 DIAGNOSIS — I5032 Chronic diastolic (congestive) heart failure: Secondary | ICD-10-CM | POA: Diagnosis not present

## 2018-04-29 DIAGNOSIS — R351 Nocturia: Secondary | ICD-10-CM | POA: Diagnosis not present

## 2018-04-29 DIAGNOSIS — I480 Paroxysmal atrial fibrillation: Secondary | ICD-10-CM | POA: Diagnosis not present

## 2018-04-29 DIAGNOSIS — R32 Unspecified urinary incontinence: Secondary | ICD-10-CM

## 2018-04-29 DIAGNOSIS — J441 Chronic obstructive pulmonary disease with (acute) exacerbation: Secondary | ICD-10-CM | POA: Diagnosis not present

## 2018-04-29 DIAGNOSIS — I130 Hypertensive heart and chronic kidney disease with heart failure and stage 1 through stage 4 chronic kidney disease, or unspecified chronic kidney disease: Secondary | ICD-10-CM | POA: Diagnosis not present

## 2018-04-29 DIAGNOSIS — I2510 Atherosclerotic heart disease of native coronary artery without angina pectoris: Secondary | ICD-10-CM | POA: Diagnosis not present

## 2018-04-29 DIAGNOSIS — E1122 Type 2 diabetes mellitus with diabetic chronic kidney disease: Secondary | ICD-10-CM | POA: Diagnosis not present

## 2018-04-30 DIAGNOSIS — J441 Chronic obstructive pulmonary disease with (acute) exacerbation: Secondary | ICD-10-CM | POA: Diagnosis not present

## 2018-04-30 DIAGNOSIS — I2510 Atherosclerotic heart disease of native coronary artery without angina pectoris: Secondary | ICD-10-CM | POA: Diagnosis not present

## 2018-04-30 DIAGNOSIS — I480 Paroxysmal atrial fibrillation: Secondary | ICD-10-CM | POA: Diagnosis not present

## 2018-04-30 DIAGNOSIS — I130 Hypertensive heart and chronic kidney disease with heart failure and stage 1 through stage 4 chronic kidney disease, or unspecified chronic kidney disease: Secondary | ICD-10-CM | POA: Diagnosis not present

## 2018-04-30 DIAGNOSIS — I5032 Chronic diastolic (congestive) heart failure: Secondary | ICD-10-CM | POA: Diagnosis not present

## 2018-04-30 DIAGNOSIS — E1122 Type 2 diabetes mellitus with diabetic chronic kidney disease: Secondary | ICD-10-CM | POA: Diagnosis not present

## 2018-05-04 DIAGNOSIS — E1122 Type 2 diabetes mellitus with diabetic chronic kidney disease: Secondary | ICD-10-CM | POA: Diagnosis not present

## 2018-05-04 DIAGNOSIS — I5032 Chronic diastolic (congestive) heart failure: Secondary | ICD-10-CM | POA: Diagnosis not present

## 2018-05-04 DIAGNOSIS — I480 Paroxysmal atrial fibrillation: Secondary | ICD-10-CM | POA: Diagnosis not present

## 2018-05-04 DIAGNOSIS — J441 Chronic obstructive pulmonary disease with (acute) exacerbation: Secondary | ICD-10-CM | POA: Diagnosis not present

## 2018-05-04 DIAGNOSIS — I2510 Atherosclerotic heart disease of native coronary artery without angina pectoris: Secondary | ICD-10-CM | POA: Diagnosis not present

## 2018-05-04 DIAGNOSIS — I130 Hypertensive heart and chronic kidney disease with heart failure and stage 1 through stage 4 chronic kidney disease, or unspecified chronic kidney disease: Secondary | ICD-10-CM | POA: Diagnosis not present

## 2018-05-05 DIAGNOSIS — J441 Chronic obstructive pulmonary disease with (acute) exacerbation: Secondary | ICD-10-CM | POA: Diagnosis not present

## 2018-05-05 DIAGNOSIS — E1142 Type 2 diabetes mellitus with diabetic polyneuropathy: Secondary | ICD-10-CM | POA: Diagnosis not present

## 2018-05-05 DIAGNOSIS — I130 Hypertensive heart and chronic kidney disease with heart failure and stage 1 through stage 4 chronic kidney disease, or unspecified chronic kidney disease: Secondary | ICD-10-CM | POA: Diagnosis not present

## 2018-05-05 DIAGNOSIS — L851 Acquired keratosis [keratoderma] palmaris et plantaris: Secondary | ICD-10-CM | POA: Diagnosis not present

## 2018-05-05 DIAGNOSIS — I2510 Atherosclerotic heart disease of native coronary artery without angina pectoris: Secondary | ICD-10-CM | POA: Diagnosis not present

## 2018-05-05 DIAGNOSIS — L603 Nail dystrophy: Secondary | ICD-10-CM | POA: Diagnosis not present

## 2018-05-05 DIAGNOSIS — I5032 Chronic diastolic (congestive) heart failure: Secondary | ICD-10-CM | POA: Diagnosis not present

## 2018-05-05 DIAGNOSIS — I480 Paroxysmal atrial fibrillation: Secondary | ICD-10-CM | POA: Diagnosis not present

## 2018-05-05 DIAGNOSIS — E1122 Type 2 diabetes mellitus with diabetic chronic kidney disease: Secondary | ICD-10-CM | POA: Diagnosis not present

## 2018-05-06 DIAGNOSIS — J449 Chronic obstructive pulmonary disease, unspecified: Secondary | ICD-10-CM | POA: Diagnosis not present

## 2018-05-06 DIAGNOSIS — E1140 Type 2 diabetes mellitus with diabetic neuropathy, unspecified: Secondary | ICD-10-CM | POA: Diagnosis not present

## 2018-05-06 DIAGNOSIS — I2510 Atherosclerotic heart disease of native coronary artery without angina pectoris: Secondary | ICD-10-CM | POA: Diagnosis not present

## 2018-05-06 DIAGNOSIS — I10 Essential (primary) hypertension: Secondary | ICD-10-CM | POA: Diagnosis not present

## 2018-05-07 DIAGNOSIS — I480 Paroxysmal atrial fibrillation: Secondary | ICD-10-CM | POA: Diagnosis not present

## 2018-05-07 DIAGNOSIS — I5032 Chronic diastolic (congestive) heart failure: Secondary | ICD-10-CM | POA: Diagnosis not present

## 2018-05-07 DIAGNOSIS — J441 Chronic obstructive pulmonary disease with (acute) exacerbation: Secondary | ICD-10-CM | POA: Diagnosis not present

## 2018-05-07 DIAGNOSIS — I2510 Atherosclerotic heart disease of native coronary artery without angina pectoris: Secondary | ICD-10-CM | POA: Diagnosis not present

## 2018-05-07 DIAGNOSIS — I130 Hypertensive heart and chronic kidney disease with heart failure and stage 1 through stage 4 chronic kidney disease, or unspecified chronic kidney disease: Secondary | ICD-10-CM | POA: Diagnosis not present

## 2018-05-07 DIAGNOSIS — E1122 Type 2 diabetes mellitus with diabetic chronic kidney disease: Secondary | ICD-10-CM | POA: Diagnosis not present

## 2018-05-11 DIAGNOSIS — I5032 Chronic diastolic (congestive) heart failure: Secondary | ICD-10-CM | POA: Diagnosis not present

## 2018-05-11 DIAGNOSIS — I480 Paroxysmal atrial fibrillation: Secondary | ICD-10-CM | POA: Diagnosis not present

## 2018-05-11 DIAGNOSIS — I2510 Atherosclerotic heart disease of native coronary artery without angina pectoris: Secondary | ICD-10-CM | POA: Diagnosis not present

## 2018-05-11 DIAGNOSIS — J441 Chronic obstructive pulmonary disease with (acute) exacerbation: Secondary | ICD-10-CM | POA: Diagnosis not present

## 2018-05-11 DIAGNOSIS — E1122 Type 2 diabetes mellitus with diabetic chronic kidney disease: Secondary | ICD-10-CM | POA: Diagnosis not present

## 2018-05-11 DIAGNOSIS — I130 Hypertensive heart and chronic kidney disease with heart failure and stage 1 through stage 4 chronic kidney disease, or unspecified chronic kidney disease: Secondary | ICD-10-CM | POA: Diagnosis not present

## 2018-05-19 DIAGNOSIS — I130 Hypertensive heart and chronic kidney disease with heart failure and stage 1 through stage 4 chronic kidney disease, or unspecified chronic kidney disease: Secondary | ICD-10-CM | POA: Diagnosis not present

## 2018-05-19 DIAGNOSIS — I5032 Chronic diastolic (congestive) heart failure: Secondary | ICD-10-CM | POA: Diagnosis not present

## 2018-05-19 DIAGNOSIS — E1122 Type 2 diabetes mellitus with diabetic chronic kidney disease: Secondary | ICD-10-CM | POA: Diagnosis not present

## 2018-05-19 DIAGNOSIS — I480 Paroxysmal atrial fibrillation: Secondary | ICD-10-CM | POA: Diagnosis not present

## 2018-05-19 DIAGNOSIS — I2510 Atherosclerotic heart disease of native coronary artery without angina pectoris: Secondary | ICD-10-CM | POA: Diagnosis not present

## 2018-05-19 DIAGNOSIS — J441 Chronic obstructive pulmonary disease with (acute) exacerbation: Secondary | ICD-10-CM | POA: Diagnosis not present

## 2018-05-26 DIAGNOSIS — E1122 Type 2 diabetes mellitus with diabetic chronic kidney disease: Secondary | ICD-10-CM | POA: Diagnosis not present

## 2018-05-26 DIAGNOSIS — J441 Chronic obstructive pulmonary disease with (acute) exacerbation: Secondary | ICD-10-CM | POA: Diagnosis not present

## 2018-05-26 DIAGNOSIS — I5032 Chronic diastolic (congestive) heart failure: Secondary | ICD-10-CM | POA: Diagnosis not present

## 2018-05-26 DIAGNOSIS — I480 Paroxysmal atrial fibrillation: Secondary | ICD-10-CM | POA: Diagnosis not present

## 2018-05-26 DIAGNOSIS — I2510 Atherosclerotic heart disease of native coronary artery without angina pectoris: Secondary | ICD-10-CM | POA: Diagnosis not present

## 2018-05-26 DIAGNOSIS — I130 Hypertensive heart and chronic kidney disease with heart failure and stage 1 through stage 4 chronic kidney disease, or unspecified chronic kidney disease: Secondary | ICD-10-CM | POA: Diagnosis not present

## 2018-06-02 DIAGNOSIS — E1122 Type 2 diabetes mellitus with diabetic chronic kidney disease: Secondary | ICD-10-CM | POA: Diagnosis not present

## 2018-06-02 DIAGNOSIS — J441 Chronic obstructive pulmonary disease with (acute) exacerbation: Secondary | ICD-10-CM | POA: Diagnosis not present

## 2018-06-02 DIAGNOSIS — I480 Paroxysmal atrial fibrillation: Secondary | ICD-10-CM | POA: Diagnosis not present

## 2018-06-02 DIAGNOSIS — I5032 Chronic diastolic (congestive) heart failure: Secondary | ICD-10-CM | POA: Diagnosis not present

## 2018-06-02 DIAGNOSIS — I2510 Atherosclerotic heart disease of native coronary artery without angina pectoris: Secondary | ICD-10-CM | POA: Diagnosis not present

## 2018-06-02 DIAGNOSIS — I130 Hypertensive heart and chronic kidney disease with heart failure and stage 1 through stage 4 chronic kidney disease, or unspecified chronic kidney disease: Secondary | ICD-10-CM | POA: Diagnosis not present

## 2018-06-03 DIAGNOSIS — G43909 Migraine, unspecified, not intractable, without status migrainosus: Secondary | ICD-10-CM | POA: Diagnosis not present

## 2018-06-03 DIAGNOSIS — I2510 Atherosclerotic heart disease of native coronary artery without angina pectoris: Secondary | ICD-10-CM | POA: Diagnosis not present

## 2018-06-03 DIAGNOSIS — J449 Chronic obstructive pulmonary disease, unspecified: Secondary | ICD-10-CM | POA: Diagnosis not present

## 2018-06-03 DIAGNOSIS — I10 Essential (primary) hypertension: Secondary | ICD-10-CM | POA: Diagnosis not present

## 2018-06-09 ENCOUNTER — Encounter: Payer: Self-pay | Admitting: Gastroenterology

## 2018-06-09 DIAGNOSIS — E1122 Type 2 diabetes mellitus with diabetic chronic kidney disease: Secondary | ICD-10-CM | POA: Diagnosis not present

## 2018-06-09 DIAGNOSIS — I480 Paroxysmal atrial fibrillation: Secondary | ICD-10-CM | POA: Diagnosis not present

## 2018-06-09 DIAGNOSIS — I2510 Atherosclerotic heart disease of native coronary artery without angina pectoris: Secondary | ICD-10-CM | POA: Diagnosis not present

## 2018-06-09 DIAGNOSIS — J441 Chronic obstructive pulmonary disease with (acute) exacerbation: Secondary | ICD-10-CM | POA: Diagnosis not present

## 2018-06-09 DIAGNOSIS — I130 Hypertensive heart and chronic kidney disease with heart failure and stage 1 through stage 4 chronic kidney disease, or unspecified chronic kidney disease: Secondary | ICD-10-CM | POA: Diagnosis not present

## 2018-06-09 DIAGNOSIS — I5032 Chronic diastolic (congestive) heart failure: Secondary | ICD-10-CM | POA: Diagnosis not present

## 2018-07-10 DIAGNOSIS — E1165 Type 2 diabetes mellitus with hyperglycemia: Secondary | ICD-10-CM | POA: Diagnosis not present

## 2018-07-10 DIAGNOSIS — E039 Hypothyroidism, unspecified: Secondary | ICD-10-CM | POA: Diagnosis not present

## 2018-07-11 LAB — COMPLETE METABOLIC PANEL WITH GFR
AG RATIO: 1.5 (calc) (ref 1.0–2.5)
ALT: 15 U/L (ref 6–29)
AST: 15 U/L (ref 10–35)
Albumin: 4 g/dL (ref 3.6–5.1)
Alkaline phosphatase (APISO): 87 U/L (ref 37–153)
BILIRUBIN TOTAL: 0.3 mg/dL (ref 0.2–1.2)
BUN: 16 mg/dL (ref 7–25)
CO2: 24 mmol/L (ref 20–32)
Calcium: 9.2 mg/dL (ref 8.6–10.4)
Chloride: 105 mmol/L (ref 98–110)
Creat: 0.77 mg/dL (ref 0.60–0.93)
GFR, EST AFRICAN AMERICAN: 89 mL/min/{1.73_m2} (ref >=60)
GFR, EST NON AFRICAN AMERICAN: 77 mL/min/{1.73_m2} (ref >=60)
Globulin: 2.6 g/dL (calc) (ref 1.9–3.7)
Glucose, Bld: 155 mg/dL — ABNORMAL HIGH (ref 65–99)
Potassium: 4.7 mmol/L (ref 3.5–5.3)
Sodium: 141 mmol/L (ref 135–146)
Total Protein: 6.6 g/dL (ref 6.1–8.1)

## 2018-07-11 LAB — HEMOGLOBIN A1C
EAG (MMOL/L): 9 (calc)
Hgb A1c MFr Bld: 7.3 % of total Hgb — ABNORMAL HIGH (ref <5.7)
MEAN PLASMA GLUCOSE: 163 (calc)

## 2018-07-11 LAB — T4, FREE: FREE T4: 1.7 ng/dL (ref 0.8–1.8)

## 2018-07-11 LAB — TSH: TSH: 0.29 m[IU]/L — AB (ref 0.40–4.50)

## 2018-07-17 ENCOUNTER — Ambulatory Visit: Payer: Medicare Other | Admitting: "Endocrinology

## 2018-07-21 DIAGNOSIS — L603 Nail dystrophy: Secondary | ICD-10-CM | POA: Diagnosis not present

## 2018-07-21 DIAGNOSIS — E1142 Type 2 diabetes mellitus with diabetic polyneuropathy: Secondary | ICD-10-CM | POA: Diagnosis not present

## 2018-07-21 DIAGNOSIS — L851 Acquired keratosis [keratoderma] palmaris et plantaris: Secondary | ICD-10-CM | POA: Diagnosis not present

## 2018-08-03 ENCOUNTER — Ambulatory Visit (INDEPENDENT_AMBULATORY_CARE_PROVIDER_SITE_OTHER): Payer: Medicare Other | Admitting: "Endocrinology

## 2018-08-03 ENCOUNTER — Encounter: Payer: Self-pay | Admitting: "Endocrinology

## 2018-08-03 VITALS — BP 138/83 | HR 64 | Ht 63.0 in | Wt 274.0 lb

## 2018-08-03 DIAGNOSIS — I10 Essential (primary) hypertension: Secondary | ICD-10-CM

## 2018-08-03 DIAGNOSIS — E1165 Type 2 diabetes mellitus with hyperglycemia: Secondary | ICD-10-CM

## 2018-08-03 DIAGNOSIS — E039 Hypothyroidism, unspecified: Secondary | ICD-10-CM

## 2018-08-03 DIAGNOSIS — E782 Mixed hyperlipidemia: Secondary | ICD-10-CM

## 2018-08-03 DIAGNOSIS — E2740 Unspecified adrenocortical insufficiency: Secondary | ICD-10-CM

## 2018-08-03 MED ORDER — INSULIN DETEMIR 100 UNIT/ML FLEXPEN: 60.0000 [IU] | PEN_INJECTOR | Freq: Every day | SUBCUTANEOUS | 2 refills | Status: DC

## 2018-08-03 MED ORDER — LEVOTHYROXINE SODIUM 175 MCG PO TABS: 175.0000 ug | ORAL_TABLET | Freq: Every day | ORAL | 6 refills | Status: DC

## 2018-08-03 NOTE — Patient Instructions (Signed)
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
## 2018-08-05 DIAGNOSIS — I10 Essential (primary) hypertension: Secondary | ICD-10-CM | POA: Diagnosis not present

## 2018-08-05 DIAGNOSIS — I2510 Atherosclerotic heart disease of native coronary artery without angina pectoris: Secondary | ICD-10-CM | POA: Diagnosis not present

## 2018-08-05 DIAGNOSIS — J449 Chronic obstructive pulmonary disease, unspecified: Secondary | ICD-10-CM | POA: Diagnosis not present

## 2018-08-05 DIAGNOSIS — E279 Disorder of adrenal gland, unspecified: Secondary | ICD-10-CM | POA: Diagnosis not present

## 2018-08-06 ENCOUNTER — Other Ambulatory Visit (HOSPITAL_COMMUNITY): Payer: Self-pay | Admitting: Pulmonary Disease

## 2018-08-06 DIAGNOSIS — R1903 Right lower quadrant abdominal swelling, mass and lump: Secondary | ICD-10-CM

## 2018-08-13 ENCOUNTER — Ambulatory Visit (HOSPITAL_COMMUNITY): Admission: RE | Admit: 2018-08-13 | Payer: Medicare Other | Source: Ambulatory Visit

## 2018-09-02 ENCOUNTER — Ambulatory Visit (INDEPENDENT_AMBULATORY_CARE_PROVIDER_SITE_OTHER): Payer: Medicare Other | Admitting: Gastroenterology

## 2018-09-02 ENCOUNTER — Other Ambulatory Visit: Payer: Self-pay

## 2018-09-02 DIAGNOSIS — K219 Gastro-esophageal reflux disease without esophagitis: Secondary | ICD-10-CM

## 2018-09-02 MED ORDER — PANTOPRAZOLE SODIUM 40 MG PO TBEC: DELAYED_RELEASE_TABLET | ORAL | 3 refills | Status: DC

## 2018-09-02 NOTE — Patient Instructions (Addendum)
DRINK WATER TO KEEP YOUR URINE LIGHT YELLOW. DRINK HALF AS MUCH AFTER DINNER UNTIL YOUR PROTONIX COMES.  AVOID GREEN TEA UNTIL YOUR PANTOPRAZOLE ARRIVES. UNLESS IT SAYS DECAFFEINATED ON THE FRONT OF THE BOX, IT CONTAINS CAFFEINE AND MAY MAKE YOU HEARTBURN MORE DIFFICULT TO CONTROL.   USE TUMS WHEN NEEDED TO CONTROL YOUR HEARTBURN.  AVOID REFLUX TRIGGERS. SEE INFO BELOW.  FOLLOW UP IN 6 MOS.  PLEASE CALL IF YOU HAVE QUESTIONS OR CONCERNS.   Lifestyle and home remedies TO MANAGE REFLUX/HEARTBURN  You may eliminate or reduce the frequency of heartburn by making the following lifestyle changes:  . Control your weight. Being overweight is a major risk factor for heartburn and GERD. Excess pounds put pressure on your abdomen, pushing up your stomach and causing acid to back up into your esophagus.   . Eat smaller meals. 4 TO 6 MEALS A DAY. This reduces pressure on the lower esophageal sphincter, helping to prevent the valve from opening and acid from washing back into your esophagus.   Dolphus Jenny your belt. Clothes that fit tightly around your waist put pressure on your abdomen and the lower esophageal sphincter.   . Eliminate heartburn triggers. Everyone has specific triggers. Common triggers such as fatty or fried foods, spicy food, tomato sauce, carbonated beverages, alcohol, chocolate, mint, garlic, onion, caffeine and nicotine may make heartburn worse.   Marland Kitchen Avoid stooping or bending. Tying your shoes is OK. Bending over for longer periods to weed your garden isn't, especially soon after eating.   . Don't lie down after a meal. Wait at least three to four hours after eating before going to bed, and don't lie down right after eating.   Marland Kitchen SLEEP WITH YOUR HEAD ABOVE YOUR HEART.   Alternative medicine . Several home remedies exist for treating GERD, but they provide only temporary relief. They include drinking baking soda (sodium bicarbonate) added to water or drinking other fluids such as  baking soda mixed with cream of tartar and water.  . Although these liquids create temporary relief by neutralizing, washing away or buffering acids, eventually they aggravate the situation by adding gas and fluid to your stomach, increasing pressure and causing more acid reflux. Further, adding more sodium to your diet may increase your blood pressure and add stress to your heart, and excessive bicarbonate ingestion can alter the acid-base balance in your body.

## 2018-09-02 NOTE — Assessment & Plan Note (Signed)
SYMPTOMS NOT CONTROLLED OFF PPI AND WITH DAILY GREEN TEA CONSUMPTION.  DRINK WATER TO KEEP YOUR URINE LIGHT YELLOW. DRINK HALF AS MUCH AFTER DINNER UNTIL YOUR PANTOPRAZOLE COMES. AVOID GREEN TEA UNTIL YOUR PANTOPRAZOLE ARRIVES. IT CONTAINS CAFFEINE AND MAY MAKE YOU HEARTBURN MORE DIFFICULT TO CONTROL. USE TUMS WHEN NEEDED TO CONTROL YOUR HEARTBURN. AVOID REFLUX TRIGGERS.  HANDOUT GIVEN AND DISCUSSED. FOLLOW UP IN 6 MOS.  PLEASE CALL IF YOU HAVE QUESTIONS OR CONCERNS.

## 2018-09-02 NOTE — Progress Notes (Signed)
Subjective:    Patient ID: Ann [name], female    DOB: 1945/08/24, 73 y.o.   MRN: [id number]  Sinda Du, MD   HPI ONLY THING SHE HAS HEARTBURN. RUN OUT OF REFLUX MEDICINE MAR 27. MAINLY AT NIGHT. LAYS DOWN AND GOES . SLEEPING ON A HOSPITAL BED WITH HEAD ABOVE HER HEART. MAY WAIT 4 HRS TO LAY DOWN. DRINKING WATER OR GREEN TEA. RARE CHEST PAIN OR SOB DUE TO COPD. KEEP USING TUMS TO DISSOLVE IN MOUTH  PT DENIES FEVER, CHILLS, HEMATOCHEZIA, HEMATEMESIS, nausea, vomiting, melena, diarrhea,  CHANGE IN BOWEL IN HABITS, constipation, abdominal pain, OR problems swallowing.   Current Outpatient Medications  Medication Sig    . albuterol (PROVENTIL HFA;VENTOLIN HFA) 108 (90 BASE) MCG/ACT inhaler Inhale 2 puffs into the lungs every 4 (four) hours as needed for wheezing.    Marland Kitchen ALPRAZolam (XANAX) 0.5 MG tablet Take 0.5 mg by mouth 2 (two) times daily as needed for anxiety or sleep.     Marland Kitchen amLODipine (NORVASC) 5 MG tablet Take 1 tablet (5 mg total) by mouth daily.    Marland Kitchen atorvastatin (LIPITOR) 80 MG tablet Take 1 tablet (80 mg total) by mouth daily at 6 PM.    . Carboxymethylcellulose Sod PF (REFRESH PLUS) 0.5 % SOLN Place 1 drop into both eyes daily as needed (for dry eye relief).     . Cholecalciferol (VITAMIN D3) 5000 units CAPS Take 1 capsule (5,000 Units total) by mouth daily.    Mariane Baumgarten Sodium 100 MG capsule Take 100 mg by mouth daily.     Marland Kitchen gabapentin (NEURONTIN) 400 MG capsule Take 400 mg by mouth 3 (three) times daily.    . hydrALAZINE (APRESOLINE) 50 MG tablet  Take 50 mg by mouth 2 (two) times daily. )    . insulin aspart (NOVOLOG) 100 UNIT/ML injection Inject 8 Units into the skin as needed for high blood sugar. Take if blood sugar over 200.    Marland Kitchen Insulin Detemir (LEVEMIR FLEXTOUCH) 100 UNIT/ML Pen Inject 60 Units into the skin at bedtime.    .      .      . levothyroxine (SYNTHROID, LEVOTHROID) 175 MCG tablet Take 1 tablet (175 mcg total) by mouth daily before breakfast.    .  metFORMIN (GLUCOPHAGE) 500 MG tablet Take 500 mg by mouth 2 (two) times daily.    . mirabegron ER (MYRBETRIQ) 50 MG TB24 tablet Take 50 mg by mouth daily.    . nitroGLYCERIN (NITROSTAT) 0.4 MG SL tablet Place 1 tablet (0.4 mg total) under the tongue every 5 (five) minutes as needed for chest pain.    . pantoprazole (PROTONIX) 40 MG tablet 1 PO 30 MINUTES PRIOR TO MEALS BID RAN OUT   . predniSONE (DELTASONE) 10 MG tablet Take 10 mg by mouth daily with breakfast.    . sertraline (ZOLOFT) 100 MG tablet Take 100 mg by mouth at bedtime.    . solifenacin (VESICARE) 10 MG tablet Take 10 mg by mouth daily.     . Tiotropium Bromide Monohydrate (SPIRIVA HANDIHALER IN) Inhale into the lungs daily. Takes 2 puffs daily    . vitamin B-12 1000 MCG tablet Take 1 tablet (1,000 mcg total) by mouth daily.    .      . Fluticasone-Salmeterol (ADVAIR) 250-50 MCG/DOSE AEPB Inhale 1 puff into the lungs 2 (two) times daily.    . traMADol (ULTRAM) 50 MG tablet Take by mouth every 6 (six) hours as needed for moderate  pain or severe pain.      Review of Systems PER HPI OTHERWISE ALL SYSTEMS ARE NEGATIVE.    Objective:   Physical Exam   TELEPHONE VISIT DUE TO COVID 19, VISIT IS CONDUCTED VIRTUALLY AND WAS REQUESTED BY PATIENT.     Assessment & Plan:

## 2018-09-03 NOTE — Progress Notes (Signed)
cc'ed to pcp °

## 2018-09-03 NOTE — Progress Notes (Signed)
ON RECALL  °

## 2018-09-22 DIAGNOSIS — E1140 Type 2 diabetes mellitus with diabetic neuropathy, unspecified: Secondary | ICD-10-CM | POA: Diagnosis not present

## 2018-09-22 DIAGNOSIS — L02415 Cutaneous abscess of right lower limb: Secondary | ICD-10-CM | POA: Diagnosis not present

## 2018-09-22 DIAGNOSIS — J449 Chronic obstructive pulmonary disease, unspecified: Secondary | ICD-10-CM | POA: Diagnosis not present

## 2018-09-22 DIAGNOSIS — I10 Essential (primary) hypertension: Secondary | ICD-10-CM | POA: Diagnosis not present

## 2018-09-29 DIAGNOSIS — J301 Allergic rhinitis due to pollen: Secondary | ICD-10-CM | POA: Diagnosis not present

## 2018-09-29 DIAGNOSIS — E1140 Type 2 diabetes mellitus with diabetic neuropathy, unspecified: Secondary | ICD-10-CM | POA: Diagnosis not present

## 2018-09-29 DIAGNOSIS — I10 Essential (primary) hypertension: Secondary | ICD-10-CM | POA: Diagnosis not present

## 2018-09-29 DIAGNOSIS — L02415 Cutaneous abscess of right lower limb: Secondary | ICD-10-CM | POA: Diagnosis not present

## 2018-11-05 ENCOUNTER — Other Ambulatory Visit: Payer: Self-pay | Admitting: Radiology

## 2018-11-06 ENCOUNTER — Other Ambulatory Visit: Payer: Self-pay | Admitting: Student

## 2018-11-09 ENCOUNTER — Other Ambulatory Visit (HOSPITAL_COMMUNITY): Payer: Self-pay | Admitting: Pulmonary Disease

## 2018-11-09 ENCOUNTER — Encounter (HOSPITAL_COMMUNITY): Payer: Self-pay

## 2018-11-09 ENCOUNTER — Ambulatory Visit (HOSPITAL_COMMUNITY)
Admission: RE | Admit: 2018-11-09 | Discharge: 2018-11-09 | Disposition: A | Discharge status: Home / Self Care | Payer: Medicare Other | Source: Ambulatory Visit | Attending: Pulmonary Disease | Admitting: Pulmonary Disease

## 2018-11-09 ENCOUNTER — Other Ambulatory Visit: Payer: Self-pay

## 2018-11-09 DIAGNOSIS — Z79899 Other long term (current) drug therapy: Secondary | ICD-10-CM | POA: Diagnosis not present

## 2018-11-09 DIAGNOSIS — Z6841 Body Mass Index (BMI) 40.0 and over, adult: Secondary | ICD-10-CM | POA: Diagnosis not present

## 2018-11-09 DIAGNOSIS — Z452 Encounter for adjustment and management of vascular access device: Secondary | ICD-10-CM | POA: Diagnosis not present

## 2018-11-09 DIAGNOSIS — E2740 Unspecified adrenocortical insufficiency: Secondary | ICD-10-CM | POA: Diagnosis not present

## 2018-11-09 DIAGNOSIS — Z86718 Personal history of other venous thrombosis and embolism: Secondary | ICD-10-CM | POA: Insufficient documentation

## 2018-11-09 DIAGNOSIS — I2510 Atherosclerotic heart disease of native coronary artery without angina pectoris: Secondary | ICD-10-CM | POA: Diagnosis not present

## 2018-11-09 DIAGNOSIS — E039 Hypothyroidism, unspecified: Secondary | ICD-10-CM | POA: Insufficient documentation

## 2018-11-09 DIAGNOSIS — Z87891 Personal history of nicotine dependence: Secondary | ICD-10-CM | POA: Insufficient documentation

## 2018-11-09 DIAGNOSIS — F419 Anxiety disorder, unspecified: Secondary | ICD-10-CM | POA: Diagnosis not present

## 2018-11-09 DIAGNOSIS — J449 Chronic obstructive pulmonary disease, unspecified: Secondary | ICD-10-CM | POA: Insufficient documentation

## 2018-11-09 DIAGNOSIS — I130 Hypertensive heart and chronic kidney disease with heart failure and stage 1 through stage 4 chronic kidney disease, or unspecified chronic kidney disease: Secondary | ICD-10-CM | POA: Insufficient documentation

## 2018-11-09 DIAGNOSIS — Z794 Long term (current) use of insulin: Secondary | ICD-10-CM | POA: Diagnosis not present

## 2018-11-09 DIAGNOSIS — E785 Hyperlipidemia, unspecified: Secondary | ICD-10-CM | POA: Diagnosis not present

## 2018-11-09 DIAGNOSIS — E1140 Type 2 diabetes mellitus with diabetic neuropathy, unspecified: Secondary | ICD-10-CM | POA: Diagnosis not present

## 2018-11-09 DIAGNOSIS — I809 Phlebitis and thrombophlebitis of unspecified site: Secondary | ICD-10-CM | POA: Diagnosis not present

## 2018-11-09 DIAGNOSIS — I5032 Chronic diastolic (congestive) heart failure: Secondary | ICD-10-CM | POA: Diagnosis not present

## 2018-11-09 DIAGNOSIS — K219 Gastro-esophageal reflux disease without esophagitis: Secondary | ICD-10-CM | POA: Insufficient documentation

## 2018-11-09 DIAGNOSIS — R1903 Right lower quadrant abdominal swelling, mass and lump: Secondary | ICD-10-CM

## 2018-11-09 HISTORY — PX: IR REMOVAL TUN ACCESS W/ PORT W/O FL MOD SED: IMG2290

## 2018-11-09 LAB — CBC
HCT: 37.3 % (ref 36.0–46.0)
Hemoglobin: 11.1 g/dL — ABNORMAL LOW (ref 12.0–15.0)
MCH: 24 pg — ABNORMAL LOW (ref 26.0–34.0)
MCHC: 29.8 g/dL — ABNORMAL LOW (ref 30.0–36.0)
MCV: 80.7 fL (ref 80.0–100.0)
Platelets: 271 10*3/uL (ref 150–400)
RBC: 4.62 MIL/uL (ref 3.87–5.11)
RDW: 16.7 % — ABNORMAL HIGH (ref 11.5–15.5)
WBC: 9.2 10*3/uL (ref 4.0–10.5)
nRBC: 0 % (ref 0.0–0.2)

## 2018-11-09 LAB — BASIC METABOLIC PANEL WITH GFR
Anion gap: 9 (ref 5–15)
BUN: 14 mg/dL (ref 8–23)
CO2: 24 mmol/L (ref 22–32)
Calcium: 9.4 mg/dL (ref 8.9–10.3)
Chloride: 106 mmol/L (ref 98–111)
Creatinine, Ser: 0.74 mg/dL (ref 0.44–1.00)
GFR calc Af Amer: >60 mL/min
GFR calc non Af Amer: >60 mL/min
Glucose, Bld: 136 mg/dL — ABNORMAL HIGH (ref 70–99)
Potassium: 4.3 mmol/L (ref 3.5–5.1)
Sodium: 139 mmol/L (ref 135–145)

## 2018-11-09 LAB — GLUCOSE, CAPILLARY: Glucose-Capillary: 134 mg/dL — ABNORMAL HIGH (ref 70–99)

## 2018-11-09 LAB — PROTIME-INR
INR: 1 (ref 0.8–1.2)
Prothrombin Time: 12.6 s (ref 11.4–15.2)

## 2018-11-09 MED ORDER — FENTANYL CITRATE (PF) 100 MCG/2ML IJ SOLN
INTRAMUSCULAR | Status: AC
  Filled 2018-11-09: qty 2

## 2018-11-09 MED ORDER — HYDROCODONE-ACETAMINOPHEN 5-325 MG PO TABS: 1.0000 | ORAL_TABLET | ORAL | Status: DC | PRN

## 2018-11-09 MED ORDER — FENTANYL CITRATE (PF) 100 MCG/2ML IJ SOLN
INTRAMUSCULAR | Status: AC | PRN
  Administered 2018-11-09: 50 ug via INTRAVENOUS

## 2018-11-09 MED ORDER — MIDAZOLAM HCL 2 MG/2ML IJ SOLN
INTRAMUSCULAR | Status: AC
  Filled 2018-11-09: qty 2

## 2018-11-09 MED ORDER — LIDOCAINE-EPINEPHRINE (PF) 1 %-1:200000 IJ SOLN
INTRAMUSCULAR | Status: AC
  Filled 2018-11-09: qty 30

## 2018-11-09 MED ORDER — SODIUM CHLORIDE 0.9 % IV SOLN: INTRAVENOUS | Status: DC

## 2018-11-09 MED ORDER — MIDAZOLAM HCL 2 MG/2ML IJ SOLN
INTRAMUSCULAR | Status: AC | PRN
  Administered 2018-11-09: 1 mg via INTRAVENOUS

## 2018-11-09 MED ORDER — CEFAZOLIN SODIUM-DEXTROSE 2-4 GM/100ML-% IV SOLN
2.0000 g | INTRAVENOUS | Status: AC
  Administered 2018-11-09: 2 g via INTRAVENOUS

## 2018-11-09 MED ORDER — CEFAZOLIN SODIUM-DEXTROSE 2-4 GM/100ML-% IV SOLN
INTRAVENOUS | Status: AC
  Filled 2018-11-09: qty 100

## 2018-11-09 NOTE — Discharge Instructions (Signed)
Wound Care, Adult Taking care of your wound properly can help to prevent pain, infection, and scarring. It can also help your wound to heal more quickly. How to care for your wound Wound care      Follow instructions from your health care provider about how to take care of your wound. Make sure you: ? Wash your hands with soap and water before you change the bandage (dressing). If soap and water are not available, use hand sanitizer. ? Remove your outer dressing as told by your health care provider. In 24-48 hours ? Leave stitches (sutures), skin glue, or adhesive strips in place. These skin closures may need to stay in place for 2 weeks or longer. If adhesive strip edges start to loosen and curl up, you may trim the loose edges. Do not remove adhesive strips completely unless your health care provider tells you to do that.  Check your wound area every day for signs of infection. Check for: ? Redness, swelling, or pain. ? Fluid or blood. ? Warmth. ? Pus or a bad smell.  Ask your health care provider if you should clean the wound with mild soap and water. Doing this may include: ? Using a clean towel to pat the wound dry after cleaning it. Do not rub or scrub the wound. ? Applying a cream or ointment. Do this only as told by your health care provider. ? Covering the incision with a clean dressing.  Ask your health care provider when you can leave the wound uncovered.  Keep the dressing dry until your health care provider says it can be removed. Do not take baths, swim, use a hot tub, or do anything that would put the wound underwater until your health care provider approves. Ask your health care provider if you can take showers. You may only be allowed to take sponge baths. Medicines   If you were prescribed an antibiotic medicine, cream, or ointment, take or use the antibiotic as told by your health care provider. Do not stop taking or using the antibiotic even if your condition  improves.  Take over-the-counter and prescription medicines only as told by your health care provider. If you were prescribed pain medicine, take it 30 or more minutes before you do any wound care or as told by your health care provider. General instructions  Return to your normal activities as told by your health care provider. Ask your health care provider what activities are safe.  Do not scratch or pick at the wound.  Do not use any products that contain nicotine or tobacco, such as cigarettes and e-cigarettes. These may delay wound healing. If you need help quitting, ask your health care provider.  Keep all follow-up visits as told by your health care provider. This is important.  Eat a diet that includes protein, vitamin A, vitamin C, and other nutrient-rich foods to help the wound heal. ? Foods rich in protein include meat, dairy, beans, nuts, and other sources. ? Foods rich in vitamin A include carrots and dark green, leafy vegetables. ? Foods rich in vitamin C include citrus, tomatoes, and other fruits and vegetables. ? Nutrient-rich foods have protein, carbohydrates, fat, vitamins, or minerals. Eat a variety of healthy foods including vegetables, fruits, and whole grains. Contact a health care provider if:  You received a tetanus shot and you have swelling, severe pain, redness, or bleeding at the injection site.  Your pain is not controlled with medicine.  You have redness, swelling, or pain  around the wound.  You have fluid or blood coming from the wound.  Your wound feels warm to the touch.  You have pus or a bad smell coming from the wound.  You have a fever or chills.  You are nauseous or you vomit.  You are dizzy. Get help right away if:  You have a red streak going away from your wound.  The edges of the wound open up and separate.  Your wound is bleeding, and the bleeding does not stop with gentle pressure.  You have a rash.  You faint.  You have  trouble breathing. Summary  Always wash your hands with soap and water before changing your bandage (dressing).  To help with healing, eat foods that are rich in protein, vitamin A, vitamin C, and other nutrients.  Check your wound every day for signs of infection. Contact your health care provider if you suspect that your wound is infected. This information is not intended to replace advice given to you by your health care provider. Make sure you discuss any questions you have with your health care provider. Document Released: 02/20/2008 Document Revised: 06/24/2017 Document Reviewed: 11/28/2015 Elsevier Interactive Patient Education  2019 [place].  Moderate Conscious Sedation, Adult, Care After These instructions provide you with information about caring for yourself after your procedure. Your health care provider may also give you more specific instructions. Your treatment has been planned according to current medical practices, but problems sometimes occur. Call your health care provider if you have any problems or questions after your procedure. What can I expect after the procedure? After your procedure, it is common:  To feel sleepy for several hours.  To feel clumsy and have poor balance for several hours.  To have poor judgment for several hours.  To vomit if you eat too soon. Follow these instructions at home: For at least 24 hours after the procedure:   Do not: ? Participate in activities where you could fall or become injured. ? Drive. ? Use heavy machinery. ? Drink alcohol. ? Take sleeping pills or medicines that cause drowsiness. ? Make important decisions or sign legal documents. ? Take care of children on your own.  Rest. Eating and drinking  Follow the diet recommended by your health care provider.  If you vomit: ? Drink water, juice, or soup when you can drink without vomiting. ? Make sure you have little or no nausea before eating solid  foods. General instructions  Have a responsible adult stay with you until you are awake and alert.  Take over-the-counter and prescription medicines only as told by your health care provider.  If you smoke, do not smoke without supervision.  Keep all follow-up visits as told by your health care provider. This is important. Contact a health care provider if:  You keep feeling nauseous or you keep vomiting.  You feel light-headed.  You develop a rash.  You have a fever. Get help right away if:  You have trouble breathing. This information is not intended to replace advice given to you by your health care provider. Make sure you discuss any questions you have with your health care provider. Document Released: 03/03/2013 Document Revised: 10/16/2015 Document Reviewed: 09/02/2015 Elsevier Interactive Patient Education  2019 [employer].

## 2018-11-09 NOTE — Procedures (Signed)
Interventional Radiology Procedure:   Indications: Port is no longer needed  Procedure: Port removal  Findings: Complete removal of left chest port  Complications: None     EBL: less than 10 ml  Plan: Discharge to home in 1 hour.     Krystopher Kuenzel R. Anselm Pancoast, MD  Pager: [phone]
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
## 2018-11-11 DIAGNOSIS — I2510 Atherosclerotic heart disease of native coronary artery without angina pectoris: Secondary | ICD-10-CM | POA: Diagnosis not present

## 2018-11-11 DIAGNOSIS — J449 Chronic obstructive pulmonary disease, unspecified: Secondary | ICD-10-CM | POA: Diagnosis not present

## 2018-11-11 DIAGNOSIS — E1165 Type 2 diabetes mellitus with hyperglycemia: Secondary | ICD-10-CM | POA: Diagnosis not present

## 2018-11-11 DIAGNOSIS — I10 Essential (primary) hypertension: Secondary | ICD-10-CM | POA: Diagnosis not present

## 2018-12-03 DIAGNOSIS — E1165 Type 2 diabetes mellitus with hyperglycemia: Secondary | ICD-10-CM | POA: Diagnosis not present

## 2018-12-04 LAB — COMPLETE METABOLIC PANEL WITH GFR
AG Ratio: 1.6 (calc) (ref 1.0–2.5)
ALBUMIN MSPROF: 4.1 g/dL (ref 3.6–5.1)
ALT: 22 U/L (ref 6–29)
AST: 29 U/L (ref 10–35)
Alkaline phosphatase (APISO): 82 U/L (ref 37–153)
BILIRUBIN TOTAL: 0.4 mg/dL (ref 0.2–1.2)
BUN: 15 mg/dL (ref 7–25)
CHLORIDE: 105 mmol/L (ref 98–110)
CO2: 23 mmol/L (ref 20–32)
CREATININE: 0.87 mg/dL (ref 0.60–0.93)
Calcium: 9.4 mg/dL (ref 8.6–10.4)
GFR, EST AFRICAN AMERICAN: 77 mL/min/{1.73_m2} (ref >=60)
GFR, Est Non African American: 66 mL/min/{1.73_m2} (ref >=60)
Globulin: 2.6 g/dL (calc) (ref 1.9–3.7)
Glucose, Bld: 178 mg/dL — ABNORMAL HIGH (ref 65–99)
Potassium: 4.7 mmol/L (ref 3.5–5.3)
Sodium: 140 mmol/L (ref 135–146)
Total Protein: 6.7 g/dL (ref 6.1–8.1)

## 2018-12-04 LAB — TSH: TSH: 2.81 m[IU]/L (ref 0.40–4.50)

## 2018-12-04 LAB — HEMOGLOBIN A1C
Hgb A1c MFr Bld: 7.5 % of total Hgb — ABNORMAL HIGH (ref <5.7)
Mean Plasma Glucose: 169 (calc)
eAG (mmol/L): 9.3 (calc)

## 2018-12-04 LAB — T4, FREE: Free T4: 1.4 ng/dL (ref 0.8–1.8)

## 2018-12-08 DIAGNOSIS — E1142 Type 2 diabetes mellitus with diabetic polyneuropathy: Secondary | ICD-10-CM | POA: Diagnosis not present

## 2018-12-08 DIAGNOSIS — L851 Acquired keratosis [keratoderma] palmaris et plantaris: Secondary | ICD-10-CM | POA: Diagnosis not present

## 2018-12-08 DIAGNOSIS — L603 Nail dystrophy: Secondary | ICD-10-CM | POA: Diagnosis not present

## 2018-12-10 ENCOUNTER — Other Ambulatory Visit: Payer: Self-pay

## 2018-12-10 ENCOUNTER — Encounter: Payer: Self-pay | Admitting: "Endocrinology

## 2018-12-10 ENCOUNTER — Ambulatory Visit (INDEPENDENT_AMBULATORY_CARE_PROVIDER_SITE_OTHER): Payer: Medicare Other | Admitting: "Endocrinology

## 2018-12-10 VITALS — BP 103/60 | HR 77 | Temp 97.9°F | Ht 63.0 in | Wt 269.8 lb

## 2018-12-10 DIAGNOSIS — E782 Mixed hyperlipidemia: Secondary | ICD-10-CM

## 2018-12-10 DIAGNOSIS — E2740 Unspecified adrenocortical insufficiency: Secondary | ICD-10-CM

## 2018-12-10 DIAGNOSIS — E039 Hypothyroidism, unspecified: Secondary | ICD-10-CM | POA: Diagnosis not present

## 2018-12-10 DIAGNOSIS — I10 Essential (primary) hypertension: Secondary | ICD-10-CM | POA: Diagnosis not present

## 2018-12-10 DIAGNOSIS — E1165 Type 2 diabetes mellitus with hyperglycemia: Secondary | ICD-10-CM

## 2018-12-10 MED ORDER — FREESTYLE LIBRE 14 DAY READER DEVI: 1.0000 | Freq: Once | 0 refills | Status: AC

## 2018-12-10 MED ORDER — FREESTYLE LIBRE 14 DAY SENSOR MISC: 1.0000 | 2 refills | Status: DC
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
## 2018-12-10 NOTE — Patient Instructions (Signed)

## 2019-01-21 DIAGNOSIS — I10 Essential (primary) hypertension: Secondary | ICD-10-CM | POA: Diagnosis not present

## 2019-01-21 DIAGNOSIS — M545 Low back pain: Secondary | ICD-10-CM | POA: Diagnosis not present

## 2019-01-21 DIAGNOSIS — E119 Type 2 diabetes mellitus without complications: Secondary | ICD-10-CM | POA: Diagnosis not present

## 2019-01-21 DIAGNOSIS — J449 Chronic obstructive pulmonary disease, unspecified: Secondary | ICD-10-CM | POA: Diagnosis not present

## 2019-02-09 ENCOUNTER — Ambulatory Visit: Payer: Medicare Other | Admitting: General Surgery

## 2019-02-15 DIAGNOSIS — F419 Anxiety disorder, unspecified: Secondary | ICD-10-CM | POA: Diagnosis not present

## 2019-02-15 DIAGNOSIS — I10 Essential (primary) hypertension: Secondary | ICD-10-CM | POA: Diagnosis not present

## 2019-02-15 DIAGNOSIS — M545 Low back pain: Secondary | ICD-10-CM | POA: Diagnosis not present

## 2019-02-15 DIAGNOSIS — Z23 Encounter for immunization: Secondary | ICD-10-CM | POA: Diagnosis not present

## 2019-02-15 DIAGNOSIS — E1165 Type 2 diabetes mellitus with hyperglycemia: Secondary | ICD-10-CM | POA: Diagnosis not present

## 2019-02-16 DIAGNOSIS — L603 Nail dystrophy: Secondary | ICD-10-CM | POA: Diagnosis not present

## 2019-02-16 DIAGNOSIS — E1142 Type 2 diabetes mellitus with diabetic polyneuropathy: Secondary | ICD-10-CM | POA: Diagnosis not present

## 2019-02-16 DIAGNOSIS — L851 Acquired keratosis [keratoderma] palmaris et plantaris: Secondary | ICD-10-CM | POA: Diagnosis not present

## 2019-02-23 ENCOUNTER — Encounter: Payer: Self-pay | Admitting: Gastroenterology

## 2019-04-14 ENCOUNTER — Ambulatory Visit (INDEPENDENT_AMBULATORY_CARE_PROVIDER_SITE_OTHER): Payer: Medicare Other | Admitting: Gastroenterology

## 2019-04-14 ENCOUNTER — Encounter: Payer: Self-pay | Admitting: Gastroenterology

## 2019-04-14 ENCOUNTER — Other Ambulatory Visit: Payer: Self-pay

## 2019-04-14 DIAGNOSIS — K219 Gastro-esophageal reflux disease without esophagitis: Secondary | ICD-10-CM | POA: Diagnosis not present

## 2019-04-14 NOTE — Assessment & Plan Note (Signed)
SYMPTOMS CONTROLLED/RESOLVED.  DRINK WATER TO KEEP YOUR URINE LIGHT YELLOW. FOLLOW A HIGH FIBER DIET. AVOID ITEMS THAT CAUSE BLOATING & GAS. CONTINUE PROTONIX. TAKE 30 MINUTES PRIOR TO MEALS ONCE OR TWICE DAILY. FOLLOW UP IN 1 YEAR.  PLEASE CALL WITH QUESTIONS OR CONCERNS.

## 2019-04-14 NOTE — Patient Instructions (Signed)
DRINK WATER TO KEEP YOUR URINE LIGHT YELLOW.  FOLLOW A HIGH FIBER DIET. AVOID ITEMS THAT CAUSE BLOATING & GAS.  CONTINUE PROTONIX. TAKE 30 MINUTES PRIOR TO MEALS ONCE OR TWICE DAILY.  FOLLOW UP IN 1 YEAR.   PLEASE CALL WITH QUESTIONS OR CONCERNS.
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
## 2019-04-15 NOTE — Progress Notes (Signed)
CC'ED TO PCP 

## 2019-04-20 DIAGNOSIS — I10 Essential (primary) hypertension: Secondary | ICD-10-CM | POA: Diagnosis not present

## 2019-04-20 DIAGNOSIS — I2510 Atherosclerotic heart disease of native coronary artery without angina pectoris: Secondary | ICD-10-CM | POA: Diagnosis not present

## 2019-04-20 DIAGNOSIS — J441 Chronic obstructive pulmonary disease with (acute) exacerbation: Secondary | ICD-10-CM | POA: Diagnosis not present

## 2019-04-20 DIAGNOSIS — E1165 Type 2 diabetes mellitus with hyperglycemia: Secondary | ICD-10-CM | POA: Diagnosis not present

## 2019-04-27 DIAGNOSIS — L851 Acquired keratosis [keratoderma] palmaris et plantaris: Secondary | ICD-10-CM | POA: Diagnosis not present

## 2019-04-27 DIAGNOSIS — E1142 Type 2 diabetes mellitus with diabetic polyneuropathy: Secondary | ICD-10-CM | POA: Diagnosis not present

## 2019-04-27 DIAGNOSIS — L603 Nail dystrophy: Secondary | ICD-10-CM | POA: Diagnosis not present

## 2019-05-05 ENCOUNTER — Ambulatory Visit (INDEPENDENT_AMBULATORY_CARE_PROVIDER_SITE_OTHER): Payer: Medicare Other | Admitting: Urology

## 2019-05-05 DIAGNOSIS — R351 Nocturia: Secondary | ICD-10-CM | POA: Diagnosis not present

## 2019-05-05 DIAGNOSIS — N3281 Overactive bladder: Secondary | ICD-10-CM

## 2019-06-05 ENCOUNTER — Other Ambulatory Visit: Payer: Self-pay

## 2019-06-05 ENCOUNTER — Ambulatory Visit
Admission: EM | Admit: 2019-06-05 | Discharge: 2019-06-05 | Disposition: A | Discharge status: Home / Self Care | Payer: Medicare Other | Attending: Emergency Medicine | Admitting: Emergency Medicine

## 2019-06-05 DIAGNOSIS — Z20822 Contact with and (suspected) exposure to covid-19: Secondary | ICD-10-CM

## 2019-06-05 DIAGNOSIS — Z20828 Contact with and (suspected) exposure to other viral communicable diseases: Secondary | ICD-10-CM | POA: Diagnosis not present

## 2019-06-05 NOTE — Discharge Instructions (Signed)
COVID testing ordered.  It will take between 5-7 days for test results.  Someone will contact you regarding abnormal results.    In the meantime: You should remain isolated in your home for 10 days from symptom onset AND greater than 72 hours after symptoms resolution (absence of fever without the use of fever-reducing medication and improvement in respiratory symptoms), whichever is longer OR 14 days from exposure Get plenty of rest and push fluids Use OTC zyrtec for nasal congestion, runny nose, and/or sore throat Use OTC flonase for nasal congestion and runny nose Use medications daily for symptom relief Use OTC medications like ibuprofen or tylenol as needed fever or pain Call or go to the ED if you have any new or worsening symptoms such as fever, cough, shortness of breath, chest tightness, chest pain, turning blue, changes in mental status, etc...  

## 2019-06-05 NOTE — ED Triage Notes (Signed)
Pt presents to [facility] for covid test. Denies symptoms. Reports positive covid exposure this week.
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
## 2019-06-06 LAB — NOVEL CORONAVIRUS, NAA: SARS-CoV-2, NAA: NOT DETECTED

## 2019-06-08 DIAGNOSIS — E559 Vitamin D deficiency, unspecified: Secondary | ICD-10-CM | POA: Diagnosis not present

## 2019-06-08 DIAGNOSIS — E039 Hypothyroidism, unspecified: Secondary | ICD-10-CM | POA: Diagnosis not present

## 2019-06-08 DIAGNOSIS — E1165 Type 2 diabetes mellitus with hyperglycemia: Secondary | ICD-10-CM | POA: Diagnosis not present

## 2019-06-09 LAB — COMPLETE METABOLIC PANEL WITH GFR
AG Ratio: 1.6 (calc) (ref 1.0–2.5)
ALT: 15 U/L (ref 6–29)
AST: 19 U/L (ref 10–35)
Albumin: 4.1 g/dL (ref 3.6–5.1)
Alkaline phosphatase (APISO): 100 U/L (ref 37–153)
BUN: 14 mg/dL (ref 7–25)
CO2: 26 mmol/L (ref 20–32)
Calcium: 9.6 mg/dL (ref 8.6–10.4)
Chloride: 105 mmol/L (ref 98–110)
Creat: 0.82 mg/dL (ref 0.60–0.93)
GFR, Est African American: 82 mL/min/{1.73_m2} (ref >=60)
GFR, Est Non African American: 71 mL/min/{1.73_m2} (ref >=60)
Globulin: 2.6 g/dL (calc) (ref 1.9–3.7)
Glucose, Bld: 207 mg/dL — ABNORMAL HIGH (ref 65–99)
Potassium: 5.3 mmol/L (ref 3.5–5.3)
Sodium: 141 mmol/L (ref 135–146)
Total Bilirubin: 0.3 mg/dL (ref 0.2–1.2)
Total Protein: 6.7 g/dL (ref 6.1–8.1)

## 2019-06-09 LAB — VITAMIN D 25 HYDROXY (VIT D DEFICIENCY, FRACTURES): Vit D, 25-Hydroxy: 35 ng/mL (ref 30–100)

## 2019-06-09 LAB — HEMOGLOBIN A1C
Hgb A1c MFr Bld: 7.9 % of total Hgb — ABNORMAL HIGH (ref <5.7)
Mean Plasma Glucose: 180 (calc)
eAG (mmol/L): 10 (calc)

## 2019-06-09 LAB — T4, FREE: Free T4: 1.4 ng/dL (ref 0.8–1.8)

## 2019-06-09 LAB — TSH: TSH: 1.34 mIU/L (ref 0.40–4.50)

## 2019-06-15 ENCOUNTER — Encounter: Payer: Self-pay | Admitting: "Endocrinology

## 2019-06-15 ENCOUNTER — Ambulatory Visit (INDEPENDENT_AMBULATORY_CARE_PROVIDER_SITE_OTHER): Payer: Medicare Other | Admitting: "Endocrinology

## 2019-06-15 DIAGNOSIS — E039 Hypothyroidism, unspecified: Secondary | ICD-10-CM

## 2019-06-15 DIAGNOSIS — E2740 Unspecified adrenocortical insufficiency: Secondary | ICD-10-CM

## 2019-06-15 DIAGNOSIS — E1165 Type 2 diabetes mellitus with hyperglycemia: Secondary | ICD-10-CM

## 2019-06-15 DIAGNOSIS — I10 Essential (primary) hypertension: Secondary | ICD-10-CM | POA: Diagnosis not present

## 2019-06-15 DIAGNOSIS — E782 Mixed hyperlipidemia: Secondary | ICD-10-CM | POA: Diagnosis not present

## 2019-06-15 MED ORDER — ATORVASTATIN CALCIUM 80 MG PO TABS: 80.0000 mg | ORAL_TABLET | Freq: Every day | ORAL | 1 refills | Status: DC

## 2019-06-15 MED ORDER — AMLODIPINE BESYLATE 5 MG PO TABS: 5.0000 mg | ORAL_TABLET | Freq: Every day | ORAL | 1 refills | Status: DC

## 2019-06-15 MED ORDER — METFORMIN HCL 500 MG PO TABS: 500.0000 mg | ORAL_TABLET | Freq: Two times a day (BID) | ORAL | 1 refills | Status: DC

## 2019-06-15 MED ORDER — PREDNISONE 10 MG PO TABS: 10.0000 mg | ORAL_TABLET | Freq: Every day | ORAL | 1 refills | Status: DC

## 2019-06-15 MED ORDER — LEVEMIR FLEXTOUCH 100 UNIT/ML ~~LOC~~ SOPN: 60.0000 [IU] | PEN_INJECTOR | Freq: Every day | SUBCUTANEOUS | 1 refills | Status: DC

## 2019-06-15 MED ORDER — LEVOTHYROXINE SODIUM 175 MCG PO TABS: 175.0000 ug | ORAL_TABLET | Freq: Every day | ORAL | 6 refills | Status: DC

## 2019-06-15 NOTE — Patient Instructions (Signed)
                                     Advice for Weight Management  -For most of us the best way to lose weight is by diet management. Generally speaking, diet management means consuming less calories intentionally which over time brings about progressive weight loss.  This can be achieved more effectively by restricting carbohydrate consumption to the minimum possible.  So, it is critically important to know your numbers: how much calorie you are consuming and how much calorie you need. More importantly, our carbohydrates sources should be unprocessed or minimally processed complex starch food items.   Sometimes, it is important to balance nutrition by increasing protein intake (animal or plant source), fruits, and vegetables.  -Sticking to a routine mealtime to eat 3 meals a day and avoiding unnecessary snacks is shown to have a big role in weight control. Under normal circumstances, the only time we lose real weight is when we are hungry, so allow hunger to take place- hunger means no food between meal times, only water.  It is not advisable to starve.   -It is better to avoid simple carbohydrates including: Cakes, Sweet Desserts, Ice Cream, Soda (diet and regular), Sweet Tea, Candies, Chips, Cookies, Store Bought Juices, Alcohol in Excess of  1-2 drinks a day, Artificial Sweeteners, Doughnuts, Coffee Creamers, "Sugar-free" Products, etc, etc.  This is not a complete list.....    -Consulting with certified diabetes educators is proven to provide you with the most accurate and current information on diet.  Also, you may be  interested in discussing diet options/exchanges , we can schedule a visit with Penny Crumpton, RDN, CDE for individualized nutrition education.  -Exercise: If you are able: 30 -60 minutes a day ,4 days a week, or 150 minutes a week.  The longer the better.  Combine stretch, strength, and aerobic activities.  If you were told in the past that you  have high risk for cardiovascular diseases, you may seek evaluation by your heart doctor prior to initiating moderate to intense exercise programs.                                  Additional Care Considerations for Diabetes   -Diabetes  is a chronic disease.  The most important care consideration is regular follow-up with your diabetes care provider with the goal being avoiding or delaying its complications and to take advantage of advances in medications and technology.    -Type 2 diabetes is known to coexist with other important comorbidities such as high blood pressure and high cholesterol.  It is critical to control not only the diabetes but also the high blood pressure and high cholesterol to minimize and delay the risk of complications including coronary artery disease, stroke, amputations, blindness, etc.    - Studies showed that people with diabetes will benefit from a class of medications known as ACE inhibitors and statins.  Unless there are specific reasons not to be on these medications, the standard of care is to consider getting one from these groups of medications at an optimal doses.  These medications are generally considered safe and proven to help protect the heart and the kidneys.    - People with diabetes are encouraged to initiate and maintain regular follow-up with eye doctors, foot doctors, dentists ,   and if necessary heart and kidney doctors.     - It is highly recommended that people with diabetes quit smoking or stay away from smoking, and get yearly  flu vaccine and pneumonia vaccine at least every 5 years.  One other important lifestyle recommendation is to ensure adequate sleep - at least 6-7 hours of uninterrupted sleep at night.  -Exercise: If you are able: 30 -60 minutes a day, 4 days a week, or 150 minutes a week.  The longer the better.  Combine stretch, strength, and aerobic activities.  If you were told in the past that you have high risk for cardiovascular  diseases, you may seek evaluation by your heart doctor prior to initiating moderate to intense exercise programs.     COVID-19 Vaccine Information can be found at: [email or web address] For questions related to vaccine distribution or appointments, please email [email or web address] or call [phone].        
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
## 2019-07-06 DIAGNOSIS — E1142 Type 2 diabetes mellitus with diabetic polyneuropathy: Secondary | ICD-10-CM | POA: Diagnosis not present

## 2019-07-06 DIAGNOSIS — L603 Nail dystrophy: Secondary | ICD-10-CM | POA: Diagnosis not present

## 2019-07-06 DIAGNOSIS — L851 Acquired keratosis [keratoderma] palmaris et plantaris: Secondary | ICD-10-CM | POA: Diagnosis not present

## 2019-07-13 ENCOUNTER — Emergency Department (HOSPITAL_COMMUNITY): Payer: Medicare Other

## 2019-07-13 ENCOUNTER — Inpatient Hospital Stay (HOSPITAL_COMMUNITY)
Admission: EM | Admit: 2019-07-13 | Discharge: 2019-07-18 | DRG: 308 | Disposition: A | Discharge status: Home / Self Care | Payer: Medicare Other | Source: Ambulatory Visit | Attending: Family Medicine | Admitting: Family Medicine

## 2019-07-13 ENCOUNTER — Encounter: Payer: Self-pay | Admitting: Cardiovascular Disease

## 2019-07-13 ENCOUNTER — Other Ambulatory Visit: Payer: Self-pay

## 2019-07-13 ENCOUNTER — Ambulatory Visit (INDEPENDENT_AMBULATORY_CARE_PROVIDER_SITE_OTHER): Payer: Medicare Other | Admitting: Cardiovascular Disease

## 2019-07-13 ENCOUNTER — Encounter (HOSPITAL_COMMUNITY): Payer: Self-pay

## 2019-07-13 VITALS — BP 130/84 | HR 138 | Temp 97.6°F | Ht 63.0 in | Wt 276.0 lb

## 2019-07-13 DIAGNOSIS — Z794 Long term (current) use of insulin: Secondary | ICD-10-CM | POA: Diagnosis not present

## 2019-07-13 DIAGNOSIS — R32 Unspecified urinary incontinence: Secondary | ICD-10-CM | POA: Diagnosis present

## 2019-07-13 DIAGNOSIS — J449 Chronic obstructive pulmonary disease, unspecified: Secondary | ICD-10-CM | POA: Diagnosis not present

## 2019-07-13 DIAGNOSIS — Z955 Presence of coronary angioplasty implant and graft: Secondary | ICD-10-CM | POA: Diagnosis not present

## 2019-07-13 DIAGNOSIS — I110 Hypertensive heart disease with heart failure: Secondary | ICD-10-CM | POA: Diagnosis present

## 2019-07-13 DIAGNOSIS — D649 Anemia, unspecified: Secondary | ICD-10-CM | POA: Diagnosis present

## 2019-07-13 DIAGNOSIS — K5900 Constipation, unspecified: Secondary | ICD-10-CM | POA: Diagnosis present

## 2019-07-13 DIAGNOSIS — E039 Hypothyroidism, unspecified: Secondary | ICD-10-CM | POA: Diagnosis present

## 2019-07-13 DIAGNOSIS — E2740 Unspecified adrenocortical insufficiency: Secondary | ICD-10-CM | POA: Diagnosis present

## 2019-07-13 DIAGNOSIS — I480 Paroxysmal atrial fibrillation: Secondary | ICD-10-CM | POA: Diagnosis not present

## 2019-07-13 DIAGNOSIS — I10 Essential (primary) hypertension: Secondary | ICD-10-CM | POA: Diagnosis not present

## 2019-07-13 DIAGNOSIS — I4891 Unspecified atrial fibrillation: Secondary | ICD-10-CM | POA: Diagnosis not present

## 2019-07-13 DIAGNOSIS — I25118 Atherosclerotic heart disease of native coronary artery with other forms of angina pectoris: Secondary | ICD-10-CM

## 2019-07-13 DIAGNOSIS — I5033 Acute on chronic diastolic (congestive) heart failure: Secondary | ICD-10-CM | POA: Diagnosis present

## 2019-07-13 DIAGNOSIS — E785 Hyperlipidemia, unspecified: Secondary | ICD-10-CM | POA: Diagnosis present

## 2019-07-13 DIAGNOSIS — Z79899 Other long term (current) drug therapy: Secondary | ICD-10-CM | POA: Diagnosis not present

## 2019-07-13 DIAGNOSIS — I82432 Acute embolism and thrombosis of left popliteal vein: Secondary | ICD-10-CM | POA: Diagnosis not present

## 2019-07-13 DIAGNOSIS — I2510 Atherosclerotic heart disease of native coronary artery without angina pectoris: Secondary | ICD-10-CM | POA: Diagnosis present

## 2019-07-13 DIAGNOSIS — Z86718 Personal history of other venous thrombosis and embolism: Secondary | ICD-10-CM

## 2019-07-13 DIAGNOSIS — Z90710 Acquired absence of both cervix and uterus: Secondary | ICD-10-CM

## 2019-07-13 DIAGNOSIS — I5031 Acute diastolic (congestive) heart failure: Secondary | ICD-10-CM | POA: Diagnosis not present

## 2019-07-13 DIAGNOSIS — Z79890 Hormone replacement therapy (postmenopausal): Secondary | ICD-10-CM | POA: Diagnosis not present

## 2019-07-13 DIAGNOSIS — D72829 Elevated white blood cell count, unspecified: Secondary | ICD-10-CM | POA: Diagnosis present

## 2019-07-13 DIAGNOSIS — K219 Gastro-esophageal reflux disease without esophagitis: Secondary | ICD-10-CM | POA: Diagnosis present

## 2019-07-13 DIAGNOSIS — E1142 Type 2 diabetes mellitus with diabetic polyneuropathy: Secondary | ICD-10-CM | POA: Diagnosis present

## 2019-07-13 DIAGNOSIS — Z7952 Long term (current) use of systemic steroids: Secondary | ICD-10-CM | POA: Diagnosis not present

## 2019-07-13 DIAGNOSIS — Z20822 Contact with and (suspected) exposure to covid-19: Secondary | ICD-10-CM | POA: Diagnosis present

## 2019-07-13 DIAGNOSIS — R0602 Shortness of breath: Secondary | ICD-10-CM | POA: Diagnosis not present

## 2019-07-13 DIAGNOSIS — Z87891 Personal history of nicotine dependence: Secondary | ICD-10-CM

## 2019-07-13 DIAGNOSIS — M79606 Pain in leg, unspecified: Secondary | ICD-10-CM

## 2019-07-13 LAB — CBC
HCT: 35.8 % — ABNORMAL LOW (ref 36.0–46.0)
Hemoglobin: 10.4 g/dL — ABNORMAL LOW (ref 12.0–15.0)
MCH: 24 pg — ABNORMAL LOW (ref 26.0–34.0)
MCHC: 29.1 g/dL — ABNORMAL LOW (ref 30.0–36.0)
MCV: 82.5 fL (ref 80.0–100.0)
Platelets: 256 10*3/uL (ref 150–400)
RBC: 4.34 MIL/uL (ref 3.87–5.11)
RDW: 17.6 % — ABNORMAL HIGH (ref 11.5–15.5)
WBC: 11 10*3/uL — ABNORMAL HIGH (ref 4.0–10.5)
nRBC: 0 % (ref 0.0–0.2)

## 2019-07-13 LAB — COMPREHENSIVE METABOLIC PANEL
ALT: 24 U/L (ref 0–44)
AST: 33 U/L (ref 15–41)
Albumin: 3.6 g/dL (ref 3.5–5.0)
Alkaline Phosphatase: 89 U/L (ref 38–126)
Anion gap: 9 (ref 5–15)
BUN: 18 mg/dL (ref 8–23)
CO2: 21 mmol/L — ABNORMAL LOW (ref 22–32)
Calcium: 8.9 mg/dL (ref 8.9–10.3)
Chloride: 107 mmol/L (ref 98–111)
Creatinine, Ser: 0.76 mg/dL (ref 0.44–1.00)
GFR calc Af Amer: >60 mL/min (ref >60)
GFR calc non Af Amer: >60 mL/min (ref >60)
Glucose, Bld: 156 mg/dL — ABNORMAL HIGH (ref 70–99)
Potassium: 4.9 mmol/L (ref 3.5–5.1)
Sodium: 137 mmol/L (ref 135–145)
Total Bilirubin: 0.3 mg/dL (ref 0.3–1.2)
Total Protein: 6.9 g/dL (ref 6.5–8.1)

## 2019-07-13 LAB — RESPIRATORY PANEL BY RT PCR (FLU A&B, COVID)
Influenza A by PCR: NEGATIVE
Influenza B by PCR: NEGATIVE
SARS Coronavirus 2 by RT PCR: NEGATIVE

## 2019-07-13 LAB — TROPONIN I (HIGH SENSITIVITY)
Troponin I (High Sensitivity): 21 ng/L — ABNORMAL HIGH (ref <18)
Troponin I (High Sensitivity): 26 ng/L — ABNORMAL HIGH (ref <18)

## 2019-07-13 LAB — GLUCOSE, CAPILLARY: Glucose-Capillary: 124 mg/dL — ABNORMAL HIGH (ref 70–99)

## 2019-07-13 LAB — BRAIN NATRIURETIC PEPTIDE: B Natriuretic Peptide: 229 pg/mL — ABNORMAL HIGH (ref 0.0–100.0)

## 2019-07-13 LAB — TSH: TSH: 0.876 u[IU]/mL (ref 0.350–4.500)

## 2019-07-13 LAB — MRSA PCR SCREENING: MRSA by PCR: NEGATIVE

## 2019-07-13 MED ORDER — ATORVASTATIN CALCIUM 40 MG PO TABS
80.0000 mg | ORAL_TABLET | Freq: Every day | ORAL | Status: DC
  Administered 2019-07-14 – 2019-07-17 (×4): 80 mg via ORAL
  Filled 2019-07-13 (×4): qty 2

## 2019-07-13 MED ORDER — LEVALBUTEROL HCL 0.63 MG/3ML IN NEBU
0.6300 mg | INHALATION_SOLUTION | Freq: Four times a day (QID) | RESPIRATORY_TRACT | Status: DC | PRN
  Administered 2019-07-17: 0.63 mg via RESPIRATORY_TRACT
  Filled 2019-07-13: qty 3

## 2019-07-13 MED ORDER — PREDNISONE 10 MG PO TABS
10.0000 mg | ORAL_TABLET | Freq: Every day | ORAL | Status: DC
  Administered 2019-07-14 – 2019-07-18 (×5): 10 mg via ORAL
  Filled 2019-07-13 (×5): qty 1

## 2019-07-13 MED ORDER — VITAMIN D 25 MCG (1000 UNIT) PO TABS
5000.0000 [IU] | ORAL_TABLET | Freq: Every day | ORAL | Status: DC
  Administered 2019-07-14 – 2019-07-18 (×5): 5000 [IU] via ORAL
  Filled 2019-07-13 (×8): qty 5

## 2019-07-13 MED ORDER — DILTIAZEM HCL 25 MG/5ML IV SOLN
10.0000 mg | Freq: Once | INTRAVENOUS | Status: AC
  Administered 2019-07-13: 10 mg via INTRAVENOUS
  Filled 2019-07-13: qty 5

## 2019-07-13 MED ORDER — SODIUM CHLORIDE 0.9% FLUSH: 3.0000 mL | INTRAVENOUS | Status: DC | PRN

## 2019-07-13 MED ORDER — FUROSEMIDE 10 MG/ML IJ SOLN
40.0000 mg | Freq: Two times a day (BID) | INTRAMUSCULAR | Status: DC
  Administered 2019-07-14 – 2019-07-15 (×3): 40 mg via INTRAVENOUS
  Filled 2019-07-13 (×3): qty 4

## 2019-07-13 MED ORDER — ONDANSETRON HCL 4 MG PO TABS: 4.0000 mg | ORAL_TABLET | Freq: Four times a day (QID) | ORAL | Status: DC | PRN

## 2019-07-13 MED ORDER — ONDANSETRON HCL 4 MG/2ML IJ SOLN: 4.0000 mg | Freq: Four times a day (QID) | INTRAMUSCULAR | Status: DC | PRN

## 2019-07-13 MED ORDER — ACETAMINOPHEN 650 MG RE SUPP: 650.0000 mg | Freq: Four times a day (QID) | RECTAL | Status: DC | PRN

## 2019-07-13 MED ORDER — SODIUM CHLORIDE 0.9 % IV SOLN: 250.0000 mL | INTRAVENOUS | Status: DC | PRN

## 2019-07-13 MED ORDER — TIOTROPIUM BROMIDE MONOHYDRATE 18 MCG IN CAPS
18.0000 ug | ORAL_CAPSULE | Freq: Every day | RESPIRATORY_TRACT | Status: DC
  Filled 2019-07-13: qty 5

## 2019-07-13 MED ORDER — PANTOPRAZOLE SODIUM 40 MG PO TBEC
40.0000 mg | DELAYED_RELEASE_TABLET | Freq: Two times a day (BID) | ORAL | Status: DC
  Administered 2019-07-13 – 2019-07-18 (×10): 40 mg via ORAL
  Filled 2019-07-13 (×10): qty 1

## 2019-07-13 MED ORDER — ACETAMINOPHEN 325 MG PO TABS
650.0000 mg | ORAL_TABLET | Freq: Four times a day (QID) | ORAL | Status: DC | PRN
  Administered 2019-07-15: 650 mg via ORAL
  Filled 2019-07-13: qty 2

## 2019-07-13 MED ORDER — GABAPENTIN 400 MG PO CAPS
400.0000 mg | ORAL_CAPSULE | Freq: Three times a day (TID) | ORAL | Status: DC
  Administered 2019-07-13 – 2019-07-18 (×14): 400 mg via ORAL
  Filled 2019-07-13 (×14): qty 1

## 2019-07-13 MED ORDER — UMECLIDINIUM BROMIDE 62.5 MCG/INH IN AEPB
1.0000 | INHALATION_SPRAY | Freq: Every day | RESPIRATORY_TRACT | Status: DC
  Administered 2019-07-14 – 2019-07-18 (×5): 1 via RESPIRATORY_TRACT
  Filled 2019-07-13: qty 7

## 2019-07-13 MED ORDER — INSULIN ASPART 100 UNIT/ML ~~LOC~~ SOLN: 0.0000 [IU] | Freq: Every day | SUBCUTANEOUS | Status: DC

## 2019-07-13 MED ORDER — DILTIAZEM HCL-DEXTROSE 125-5 MG/125ML-% IV SOLN (PREMIX)
5.0000 mg/h | INTRAVENOUS | Status: DC
  Administered 2019-07-13: 18:00:00 5 mg/h via INTRAVENOUS
  Filled 2019-07-13: qty 125

## 2019-07-13 MED ORDER — LEVOTHYROXINE SODIUM 75 MCG PO TABS
175.0000 ug | ORAL_TABLET | Freq: Every day | ORAL | Status: DC
  Administered 2019-07-14 – 2019-07-18 (×5): 175 ug via ORAL
  Filled 2019-07-13 (×5): qty 1

## 2019-07-13 MED ORDER — NITROGLYCERIN 0.4 MG SL SUBL: 0.4000 mg | SUBLINGUAL_TABLET | SUBLINGUAL | Status: DC | PRN

## 2019-07-13 MED ORDER — INSULIN ASPART 100 UNIT/ML ~~LOC~~ SOLN
0.0000 [IU] | Freq: Three times a day (TID) | SUBCUTANEOUS | Status: DC
  Administered 2019-07-14: 3 [IU] via SUBCUTANEOUS
  Administered 2019-07-14: 2 [IU] via SUBCUTANEOUS
  Administered 2019-07-14 – 2019-07-15 (×2): 3 [IU] via SUBCUTANEOUS
  Administered 2019-07-15 – 2019-07-16 (×3): 2 [IU] via SUBCUTANEOUS
  Administered 2019-07-16 (×2): 5 [IU] via SUBCUTANEOUS
  Administered 2019-07-17: 3 [IU] via SUBCUTANEOUS
  Administered 2019-07-17: 11:00:00 5 [IU] via SUBCUTANEOUS
  Administered 2019-07-17 – 2019-07-18 (×2): 3 [IU] via SUBCUTANEOUS
  Administered 2019-07-18: 11 [IU] via SUBCUTANEOUS

## 2019-07-13 MED ORDER — POLYVINYL ALCOHOL 1.4 % OP SOLN
2.0000 [drp] | Freq: Every day | OPHTHALMIC | Status: DC | PRN
  Filled 2019-07-13: qty 15

## 2019-07-13 MED ORDER — APIXABAN 5 MG PO TABS
5.0000 mg | ORAL_TABLET | Freq: Two times a day (BID) | ORAL | Status: DC
  Administered 2019-07-13 – 2019-07-18 (×10): 5 mg via ORAL
  Filled 2019-07-13 (×10): qty 1

## 2019-07-13 MED ORDER — CARBOXYMETHYLCELLULOSE SOD PF 0.5 % OP SOLN: 2.0000 [drp] | Freq: Every day | OPHTHALMIC | Status: DC | PRN

## 2019-07-13 MED ORDER — DOCUSATE SODIUM 100 MG PO CAPS
100.0000 mg | ORAL_CAPSULE | Freq: Every day | ORAL | Status: DC
  Administered 2019-07-14 – 2019-07-18 (×5): 100 mg via ORAL
  Filled 2019-07-13 (×5): qty 1

## 2019-07-13 MED ORDER — SODIUM CHLORIDE 0.9% FLUSH
3.0000 mL | Freq: Two times a day (BID) | INTRAVENOUS | Status: DC
  Administered 2019-07-13 – 2019-07-18 (×9): 3 mL via INTRAVENOUS

## 2019-07-13 MED ORDER — ALPRAZOLAM 0.5 MG PO TABS
0.5000 mg | ORAL_TABLET | Freq: Two times a day (BID) | ORAL | Status: DC | PRN
  Administered 2019-07-14 – 2019-07-15 (×2): 0.5 mg via ORAL
  Filled 2019-07-13 (×2): qty 1

## 2019-07-13 MED ORDER — INSULIN DETEMIR 100 UNIT/ML ~~LOC~~ SOLN
30.0000 [IU] | Freq: Every day | SUBCUTANEOUS | Status: DC
  Administered 2019-07-13 – 2019-07-17 (×5): 30 [IU] via SUBCUTANEOUS
  Filled 2019-07-13 (×6): qty 0.3

## 2019-07-13 NOTE — Patient Instructions (Signed)
Medication Instructions:  Your physician recommends that you continue on your current medications as directed. Please refer to the Current Medication list given to you today.   Labwork: none  Testing/Procedures: none  Follow-Up: Pt sent to ED   Any Other Special Instructions Will Be Listed Below (If Applicable).     If you need a refill on your cardiac medications before your next appointment, please call your pharmacy.
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
## 2019-07-13 NOTE — ED Notes (Signed)
Contact # of person driving Pts Husband Ann [name] [phone]

## 2019-07-13 NOTE — ED Triage Notes (Signed)
Pt says has been sob x 1 week and made appt with cardiology.  Reports saw cardiology today and sent here for rapid heart rate.
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
## 2019-07-13 NOTE — ED Notes (Signed)
No pumps available to start Cardizem. Have paged [facility] to bring more.

## 2019-07-14 ENCOUNTER — Inpatient Hospital Stay (HOSPITAL_COMMUNITY): Payer: Medicare Other

## 2019-07-14 DIAGNOSIS — I10 Essential (primary) hypertension: Secondary | ICD-10-CM

## 2019-07-14 DIAGNOSIS — I25118 Atherosclerotic heart disease of native coronary artery with other forms of angina pectoris: Secondary | ICD-10-CM

## 2019-07-14 DIAGNOSIS — I4891 Unspecified atrial fibrillation: Secondary | ICD-10-CM

## 2019-07-14 DIAGNOSIS — J449 Chronic obstructive pulmonary disease, unspecified: Secondary | ICD-10-CM

## 2019-07-14 DIAGNOSIS — I5033 Acute on chronic diastolic (congestive) heart failure: Secondary | ICD-10-CM

## 2019-07-14 LAB — CBC
HCT: 36.1 % (ref 36.0–46.0)
Hemoglobin: 10.2 g/dL — ABNORMAL LOW (ref 12.0–15.0)
MCH: 23.4 pg — ABNORMAL LOW (ref 26.0–34.0)
MCHC: 28.3 g/dL — ABNORMAL LOW (ref 30.0–36.0)
MCV: 83 fL (ref 80.0–100.0)
Platelets: 241 10*3/uL (ref 150–400)
RBC: 4.35 MIL/uL (ref 3.87–5.11)
RDW: 17.5 % — ABNORMAL HIGH (ref 11.5–15.5)
WBC: 9.3 10*3/uL (ref 4.0–10.5)
nRBC: 0 % (ref 0.0–0.2)

## 2019-07-14 LAB — GLUCOSE, CAPILLARY
Glucose-Capillary: 128 mg/dL — ABNORMAL HIGH (ref 70–99)
Glucose-Capillary: 160 mg/dL — ABNORMAL HIGH (ref 70–99)
Glucose-Capillary: 178 mg/dL — ABNORMAL HIGH (ref 70–99)
Glucose-Capillary: 125 mg/dL — ABNORMAL HIGH (ref 70–99)

## 2019-07-14 LAB — MAGNESIUM: Magnesium: 1.8 mg/dL (ref 1.7–2.4)

## 2019-07-14 LAB — BASIC METABOLIC PANEL
Anion gap: 9 (ref 5–15)
BUN: 15 mg/dL (ref 8–23)
CO2: 23 mmol/L (ref 22–32)
Calcium: 8.6 mg/dL — ABNORMAL LOW (ref 8.9–10.3)
Chloride: 108 mmol/L (ref 98–111)
Creatinine, Ser: 0.69 mg/dL (ref 0.44–1.00)
GFR calc Af Amer: >60 mL/min (ref >60)
GFR calc non Af Amer: >60 mL/min (ref >60)
Glucose, Bld: 148 mg/dL — ABNORMAL HIGH (ref 70–99)
Potassium: 4.1 mmol/L (ref 3.5–5.1)
Sodium: 140 mmol/L (ref 135–145)

## 2019-07-14 LAB — ECHOCARDIOGRAM COMPLETE
Height: 63 in
Weight: 4345.71 oz

## 2019-07-14 MED ORDER — DARIFENACIN HYDROBROMIDE ER 7.5 MG PO TB24
15.0000 mg | ORAL_TABLET | Freq: Every day | ORAL | Status: DC
  Administered 2019-07-14 – 2019-07-17 (×4): 15 mg via ORAL
  Filled 2019-07-14 (×4): qty 2

## 2019-07-14 MED ORDER — MAGNESIUM SULFATE 2 GM/50ML IV SOLN
2.0000 g | Freq: Once | INTRAVENOUS | Status: AC
  Administered 2019-07-14: 2 g via INTRAVENOUS
  Filled 2019-07-14: qty 50

## 2019-07-14 MED ORDER — METOPROLOL TARTRATE 5 MG/5ML IV SOLN: 2.5000 mg | INTRAVENOUS | Status: DC | PRN

## 2019-07-14 MED ORDER — CHLORHEXIDINE GLUCONATE CLOTH 2 % EX PADS
6.0000 | MEDICATED_PAD | Freq: Every day | CUTANEOUS | Status: DC
  Administered 2019-07-14 – 2019-07-18 (×4): 6 via TOPICAL

## 2019-07-14 MED ORDER — DILTIAZEM HCL 30 MG PO TABS
30.0000 mg | ORAL_TABLET | Freq: Four times a day (QID) | ORAL | Status: DC
  Administered 2019-07-14 – 2019-07-15 (×4): 30 mg via ORAL
  Filled 2019-07-14 (×4): qty 1

## 2019-07-14 NOTE — Progress Notes (Signed)
*  PRELIMINARY RESULTS* Echocardiogram 2D Echocardiogram has been performed.  Ann [name] 07/14/2019, 4:27 PM

## 2019-07-14 NOTE — Progress Notes (Signed)
PROGRESS NOTE [facility]   Ann [name]  [id number]  DOB: 12-23-1945  DOA: 07/13/2019 PCP: Patient, No Pcp Per   Brief Admission Hx: 75 y.o. female with medical history significant for CAD with prior LAD stent, remote history of atrial fibrillation, hypertension, insulin-dependent diabetes, adrenal insufficiency, obesity, COPD, remote history of DVT with prior use of Xarelto, and chronic diastolic heart failure with prior echo 07/2015 with LVEF 65-70%.  She was at her cardiologist's office today and complained of being short of breath for the past week and had some chest discomfort with deep inspiration.  She was found to be in Afib with RVR.    MDM/Assessment & Plan:   1. Atrial fibrillation with RVR-the patient responded well to IV Cardizem infusion and now transitioned over to oral Cardizem.  She has had some rebound tachycardia and will need to be monitored in the stepdown ICU in case we need to reinitiate the Cardizem infusion.  The patient is on apixaban 5 mg twice daily for full anticoagulation.  Follow 2D echocardiogram.  Appreciate assistance of the heart care team. 2. Acute on chronic diastolic heart failure-the patient is responding well and feeling better with IV Lasix.  She is diuresing well and we are following the 2D echocardiogram. 3. CAD status post stent placement-stable. 4. Dyslipidemia-continue atorvastatin as ordered. 5. Insulin requiring type 2 diabetes mellitus with vascular complications-he has been resumed on home basal insulin.  Holding home Metformin.  Continue to monitor CBGs and provide sliding scale coverage if needed. 6. Hypothyroidism-stable on levothyroxine continue. 7. Chronic adrenal insufficiency-she has been resumed on home prednisone.  Blood pressures have been stable.  Continue to follow closely. 8. Essential hypertension-stable follow. 9. Chronic urinary incontinence-stable. 10. GERD-continue PPI therapy.  DVT prophylaxis: apixaban Code  Status: Full  Family Communication: patient  Disposition Plan: Continue care in the stepdown ICU as patient is on IV Cardizem infusion with plans to transition to oral Cardizem but with intermittent tachycardia is not stable to discharge home.   Consultants:  Cardiology   Procedures:    Antimicrobials:     Subjective: Pt reports that she is feeling a lot better today.  A lot better than yesterday.  She denies palpitations.  She denies chest pain and shortness of breath.  Objective: Vitals:   07/14/19 1230 07/14/19 1330 07/14/19 1400 07/14/19 1415  BP: (!) 127/97 126/76 124/87 105/85  Pulse: 82 (!) 102 (!) 107 (!) 109  Resp: 17 17 (!) 22 (!) 26  Temp:      TempSrc:      SpO2: 92% 94% 94% 94%  Weight:      Height:        Intake/Output Summary (Last 24 hours) at 07/14/2019 1443 Last data filed at 07/14/2019 1100 Gross per 24 hour  Intake 128.51 ml  Output 925 ml  Net -796.49 ml   Filed Weights   07/13/19 1214 07/14/19 0500  Weight: 125.2 kg 123.2 kg     REVIEW OF SYSTEMS  As per history otherwise all reviewed and reported negative  Exam:  General exam: Awake, alert, sitting up in bed no apparent distress.  Cooperative. Respiratory system: Clear. No increased work of breathing. Cardiovascular system: Irregularly irregular S1 & S2 heard. No JVD, murmurs, gallops, clicks or pedal edema. Gastrointestinal system: Abdomen is nondistended, soft and nontender. Normal bowel sounds heard. Central nervous system: Alert and oriented. No focal neurological deficits. Extremities: 1+ pretibial edema bilateral lower extremities..  Data Reviewed: Basic Metabolic Panel: Recent  Labs  Lab 07/13/19 1257 07/14/19 0409  NA 137 140  K 4.9 4.1  CL 107 108  CO2 21* 23  GLUCOSE 156* 148*  BUN 18 15  CREATININE 0.76 0.69  CALCIUM 8.9 8.6*  MG  --  1.8   Liver Function Tests: Recent Labs  Lab 07/13/19 1257  AST 33  ALT 24  ALKPHOS 89  BILITOT 0.3  PROT 6.9  ALBUMIN  3.6   No results for input(s): LIPASE, AMYLASE in the last 168 hours. No results for input(s): AMMONIA in the last 168 hours. CBC: Recent Labs  Lab 07/13/19 1257 07/14/19 0409  WBC 11.0* 9.3  HGB 10.4* 10.2*  HCT 35.8* 36.1  MCV 82.5 83.0  PLT 256 241   Cardiac Enzymes: No results for input(s): CKTOTAL, CKMB, CKMBINDEX, TROPONINI in the last 168 hours. CBG (last 3)  Recent Labs    07/13/19 2239 07/14/19 0813 07/14/19 1151  GLUCAP 124* 128* 160*   Recent Results (from the past 240 hour(s))  Respiratory Panel by RT PCR (Flu A&B, Covid) - Nasopharyngeal Swab     Status: None   Collection Time: 07/13/19 12:43 PM   Specimen: Nasopharyngeal Swab  Result Value Ref Range Status   SARS Coronavirus 2 by RT PCR NEGATIVE NEGATIVE Final    Comment: (NOTE) SARS-CoV-2 target nucleic acids are NOT DETECTED. The SARS-CoV-2 RNA is generally detectable in upper respiratoy specimens during the acute phase of infection. The lowest concentration of SARS-CoV-2 viral copies this assay can detect is 131 copies/mL. A negative result does not preclude SARS-Cov-2 infection and should not be used as the sole basis for treatment or other patient management decisions. A negative result may occur with  improper specimen collection/handling, submission of specimen other than nasopharyngeal swab, presence of viral mutation(s) within the areas targeted by this assay, and inadequate number of viral copies (<131 copies/mL). A negative result must be combined with clinical observations, patient history, and epidemiological information. The expected result is Negative. Fact Sheet for Patients:  PinkCheek.be Fact Sheet for Healthcare Providers:  GravelBags.it This test is not yet ap proved or cleared by the [place] FDA and  has been authorized for detection and/or diagnosis of SARS-CoV-2 by FDA under an Emergency Use Authorization (EUA). This  EUA will remain  in effect (meaning this test can be used) for the duration of the COVID-19 declaration under Section 564(b)(1) of the Act, 21 U.S.C. section 360bbb-3(b)(1), unless the authorization is terminated or revoked sooner.    Influenza A by PCR NEGATIVE NEGATIVE Final   Influenza B by PCR NEGATIVE NEGATIVE Final    Comment: (NOTE) The Xpert Xpress SARS-CoV-2/FLU/RSV assay is intended as an aid in  the diagnosis of influenza from Nasopharyngeal swab specimens and  should not be used as a sole basis for treatment. Nasal washings and  aspirates are unacceptable for Xpert Xpress SARS-CoV-2/FLU/RSV  testing. Fact Sheet for Patients: PinkCheek.be Fact Sheet for Healthcare Providers: GravelBags.it This test is not yet approved or cleared by the [place] FDA and  has been authorized for detection and/or diagnosis of SARS-CoV-2 by  FDA under an Emergency Use Authorization (EUA). This EUA will remain  in effect (meaning this test can be used) for the duration of the  Covid-19 declaration under Section 564(b)(1) of the Act, 21  U.S.C. section 360bbb-3(b)(1), unless the authorization is  terminated or revoked. Performed at [facility], [address], [place],  [address]   MRSA PCR Screening     Status:  None   Collection Time: 07/13/19  7:05 PM   Specimen: Nasal Mucosa; Nasopharyngeal  Result Value Ref Range Status   MRSA by PCR NEGATIVE NEGATIVE Final    Comment:        The GeneXpert MRSA Assay (FDA approved for NASAL specimens only), is one component of a comprehensive MRSA colonization surveillance program. It is not intended to diagnose MRSA infection nor to guide or monitor treatment for MRSA infections. Performed at [facility], [address] [place], [place] [address]      Studies: DG Chest [facility] 1 View  Result Date: 07/13/2019 CLINICAL DATA:  Shortness of breath EXAM: PORTABLE CHEST 1 VIEW  COMPARISON:  04/09/2018 FINDINGS: Interval removal of left-sided chest port. Heart size remains mildly enlarged. Aorta is calcified. Mild pulmonary vascular congestion and minimal interstitial prominence. Linear scarring versus atelectasis in the mid left lung. No pleural effusion or pneumothorax. IMPRESSION: Mild cardiomegaly with pulmonary vascular congestion and minimal interstitial prominence which may reflect mild interstitial edema. Electronically Signed   By: Davina Poke D.O.   On: 07/13/2019 13:15   Scheduled Meds: . apixaban  5 mg Oral BID  . atorvastatin  80 mg Oral q1800  . Chlorhexidine Gluconate Cloth  6 each Topical Daily  . cholecalciferol  5,000 Units Oral Daily  . diltiazem  30 mg Oral Q6H  . docusate sodium  100 mg Oral Daily  . furosemide  40 mg Intravenous Q12H  . gabapentin  400 mg Oral TID  . insulin aspart  0-15 Units Subcutaneous TID WC  . insulin aspart  0-5 Units Subcutaneous QHS  . insulin detemir  30 Units Subcutaneous QHS  . levothyroxine  175 mcg Oral QAC breakfast  . pantoprazole  40 mg Oral BID  . predniSONE  10 mg Oral Q breakfast  . sodium chloride flush  3 mL Intravenous Q12H  . umeclidinium bromide  1 puff Inhalation Daily   Continuous Infusions: . sodium chloride      Active Problems:   Atrial fibrillation with rapid ventricular response (HCC)   Coronary artery disease of native artery of native heart with stable angina pectoris (HCC)   Chronic obstructive pulmonary disease [facility])  Critical Care Procedure Note Authorized and Performed by: Murvin Natal MD  Total Critical Care time:  40 mins Due to a high probability of clinically significant, life threatening deterioration, the patient required my highest level of preparedness to intervene emergently and I personally spent this critical care time directly and personally managing the patient.  This critical care time included obtaining a history; examining the patient, pulse oximetry; ordering and  review of studies; arranging urgent treatment with development of a management plan; evaluation of patient's response of treatment; frequent reassessment; and discussions with other providers.  This critical care time was performed to assess and manage the high probability of imminent and life threatening deterioration that could result in multi-organ failure.  It was exclusive of separately billable procedures and treating other patients and teaching time.    Irwin Brakeman, MD [facility] 07/14/2019, 2:43 PM    LOS: 1 day  How to contact the [facility] Attending or Consulting provider [place] or covering provider during after hours [place], for this patient?  1. Check the care team in [facility] and look for a) attending/consulting [facility] provider listed and b) the [facility] team listed 2. Log into [email or web address] and use [place]'s universal password to access. If you do not have the password, please contact the hospital  operator. 3. Locate the [facility] provider you are looking for under [facility] and page to a number that you can be directly reached. 4. If you still have difficulty reaching the provider, please page the [facility] (Director on Call) for the Hospitalists listed on amion for assistance.

## 2019-07-14 NOTE — Progress Notes (Addendum)
Progress Note  Patient Name: Ann [name] Date of Encounter: 07/14/2019  Primary Cardiologist: No primary care provider on file.   Subjective   She is feeling much better with respect to breathing and leg swelling.  She is grateful for her care.  Inpatient Medications    Scheduled Meds: . apixaban  5 mg Oral BID  . atorvastatin  80 mg Oral q1800  . Chlorhexidine Gluconate Cloth  6 each Topical Daily  . cholecalciferol  5,000 Units Oral Daily  . docusate sodium  100 mg Oral Daily  . furosemide  40 mg Intravenous Q12H  . gabapentin  400 mg Oral TID  . insulin aspart  0-15 Units Subcutaneous TID WC  . insulin aspart  0-5 Units Subcutaneous QHS  . insulin detemir  30 Units Subcutaneous QHS  . levothyroxine  175 mcg Oral QAC breakfast  . pantoprazole  40 mg Oral BID  . predniSONE  10 mg Oral Q breakfast  . sodium chloride flush  3 mL Intravenous Q12H  . umeclidinium bromide  1 puff Inhalation Daily   Continuous Infusions: . sodium chloride    . diltiazem (CARDIZEM) infusion 5 mg/hr (07/13/19 1806)  . magnesium sulfate bolus IVPB 2 g (07/14/19 0842)   PRN Meds: sodium chloride, acetaminophen **OR** acetaminophen, ALPRAZolam, levalbuterol, nitroGLYCERIN, ondansetron **OR** ondansetron (ZOFRAN) IV, polyvinyl alcohol, sodium chloride flush   Vital Signs    Vitals:   07/14/19 0600 07/14/19 0700 07/14/19 0716 07/14/19 0800  BP: 99/67 106/60 (!) 142/98 98/66  Pulse: 89 89 (!) 117 72  Resp: (!) 21 17 (!) 24 (!) 21  Temp:   97.6 F (36.4 C)   TempSrc:   Oral   SpO2: 92% 90%  93%  Weight:      Height:        Intake/Output Summary (Last 24 hours) at 07/14/2019 0902 Last data filed at 07/14/2019 0843 Gross per 24 hour  Intake 51.02 ml  Output 525 ml  Net -473.98 ml   Filed Weights   07/13/19 1214 07/14/19 0500  Weight: 125.2 kg 123.2 kg   [facility] LPN was present throughout the entirety of the encounter.  Telemetry    Rate controlled atrial fibrillation-  Personally Reviewed  ECG    No new tracings- Personally Reviewed  Physical Exam   GEN: No acute distress.   Neck: No JVD Cardiac:  Irregular rhythm, normal rate, no murmurs, rubs, or gallops.  Respiratory: Clear to auscultation bilaterally. GI: Soft, nontender, non-distended  MS:  Trace bilateral lower extremity edema; No deformity. Neuro:  Nonfocal  Psych: Normal affect   Labs    Chemistry Recent Labs  Lab 07/13/19 1257 07/14/19 0409  NA 137 140  K 4.9 4.1  CL 107 108  CO2 21* 23  GLUCOSE 156* 148*  BUN 18 15  CREATININE 0.76 0.69  CALCIUM 8.9 8.6*  PROT 6.9  --   ALBUMIN 3.6  --   AST 33  --   ALT 24  --   ALKPHOS 89  --   BILITOT 0.3  --   GFRNONAA >60 >60  GFRAA >60 >60  ANIONGAP 9 9     Hematology Recent Labs  Lab 07/13/19 1257 07/14/19 0409  WBC 11.0* 9.3  RBC 4.34 4.35  HGB 10.4* 10.2*  HCT 35.8* 36.1  MCV 82.5 83.0  MCH 24.0* 23.4*  MCHC 29.1* 28.3*  RDW 17.6* 17.5*  PLT 256 241    Cardiac EnzymesNo results for input(s): TROPONINI in the  last 168 hours. No results for input(s): TROPIPOC in the last 168 hours.   BNP Recent Labs  Lab 07/13/19 1257  BNP 229.0*     DDimer No results for input(s): DDIMER in the last 168 hours.   Radiology    DG Chest Port 1 View  Result Date: 07/13/2019 CLINICAL DATA:  Shortness of breath EXAM: PORTABLE CHEST 1 VIEW COMPARISON:  04/09/2018 FINDINGS: Interval removal of left-sided chest port. Heart size remains mildly enlarged. Aorta is calcified. Mild pulmonary vascular congestion and minimal interstitial prominence. Linear scarring versus atelectasis in the mid left lung. No pleural effusion or pneumothorax. IMPRESSION: Mild cardiomegaly with pulmonary vascular congestion and minimal interstitial prominence which may reflect mild interstitial edema. Electronically Signed   By: Davina Poke D.O.   On: 07/13/2019 13:15    Cardiac Studies   Echocardiogram for this admission pending  Nuclear  myocardial perfusion study 08/14/15 was normal.  Echocardiogram 08/14/15 showed normal left ventricular systolic and diastolic function, EF [id number], and normal regional wall motion.  Patient Profile     74 y.o. female with coronary artery disease, hypertension, COPD, insulin-dependent diabetes mellitus, adrenal insufficiency, and morbid obesity admitted for rapid atrial fibrillation and acute on chronic diastolic heart failure.  Assessment & Plan    1.  Atrial fibrillation with rapid ventricular response: Heart rates currently in the 80 to 90 bpm range at rest but she becomes tachycardic when getting out of the bed.  Currently on IV Cardizem 5 mg/h.  I will switch to oral diltiazem 30 mg every 6 hours.  Continue apixaban 5 mg twice daily for anticoagulation.  2.  Hypertension: Blood pressures are somewhat soft.  I will continue to monitor.  3.  Acute on chronic diastolic heart failure: Currently on IV Lasix 40 mg twice daily.  525 cc of recorded output but her nurse inform you that she also wet the bed.  She is symptomatically improved.  Lower extremities are much less edematous.  Renal function is stable.  I will continue twice daily dosing for today and may switch to oral Lasix on 07/15/2019.  Echocardiogram has been ordered and is pending.  4.  Coronary artery disease: History of LAD stent.  She is not on aspirin due to an allergy.  Continue atorvastatin.  She is on apixaban for thromboembolic risk reduction given rapid atrial fibrillation.  5.  COPD: Symptomatically stable.     For questions or updates, please contact [place] Please consult [email or web address] for contact info under Cardiology/STEMI.      Signed, Kate Sable, MD  07/14/2019, 9:02 AM

## 2019-07-15 DIAGNOSIS — I5031 Acute diastolic (congestive) heart failure: Secondary | ICD-10-CM

## 2019-07-15 LAB — GLUCOSE, CAPILLARY
Glucose-Capillary: 142 mg/dL — ABNORMAL HIGH (ref 70–99)
Glucose-Capillary: 129 mg/dL — ABNORMAL HIGH (ref 70–99)
Glucose-Capillary: 160 mg/dL — ABNORMAL HIGH (ref 70–99)
Glucose-Capillary: 151 mg/dL — ABNORMAL HIGH (ref 70–99)

## 2019-07-15 LAB — BASIC METABOLIC PANEL
Anion gap: 14 (ref 5–15)
BUN: 19 mg/dL (ref 8–23)
CO2: 27 mmol/L (ref 22–32)
Calcium: 8.8 mg/dL — ABNORMAL LOW (ref 8.9–10.3)
Chloride: 101 mmol/L (ref 98–111)
Creatinine, Ser: 0.93 mg/dL (ref 0.44–1.00)
GFR calc Af Amer: >60 mL/min (ref >60)
GFR calc non Af Amer: >60 mL/min (ref >60)
Glucose, Bld: 126 mg/dL — ABNORMAL HIGH (ref 70–99)
Potassium: 4.5 mmol/L (ref 3.5–5.1)
Sodium: 142 mmol/L (ref 135–145)

## 2019-07-15 LAB — MAGNESIUM: Magnesium: 2.2 mg/dL (ref 1.7–2.4)

## 2019-07-15 MED ORDER — DILTIAZEM HCL 60 MG PO TABS
60.0000 mg | ORAL_TABLET | Freq: Four times a day (QID) | ORAL | Status: DC
  Administered 2019-07-15 – 2019-07-16 (×3): 60 mg via ORAL
  Filled 2019-07-15 (×3): qty 1

## 2019-07-15 MED ORDER — FUROSEMIDE 10 MG/ML IJ SOLN: 40.0000 mg | Freq: Every day | INTRAMUSCULAR | Status: DC

## 2019-07-15 MED ORDER — FUROSEMIDE 40 MG PO TABS
40.0000 mg | ORAL_TABLET | Freq: Every day | ORAL | Status: DC
  Administered 2019-07-16 – 2019-07-18 (×3): 40 mg via ORAL
  Filled 2019-07-15 (×3): qty 1

## 2019-07-15 MED ORDER — GUAIFENESIN-DM 100-10 MG/5ML PO SYRP
5.0000 mL | ORAL_SOLUTION | ORAL | Status: DC | PRN
  Administered 2019-07-15 – 2019-07-17 (×3): 5 mL via ORAL
  Filled 2019-07-15 (×3): qty 5

## 2019-07-15 NOTE — Progress Notes (Addendum)
Progress Note  Patient Name: Ann [name] Date of Encounter: 07/15/2019  Primary Cardiologist: Kate Sable, MD   Subjective   Breathing continues to improve. No orthopnea overnight. No chest pain or palpitations.   Inpatient Medications    Scheduled Meds: . apixaban  5 mg Oral BID  . atorvastatin  80 mg Oral q1800  . Chlorhexidine Gluconate Cloth  6 each Topical Daily  . cholecalciferol  5,000 Units Oral Daily  . darifenacin  15 mg Oral QHS  . diltiazem  30 mg Oral Q6H  . docusate sodium  100 mg Oral Daily  . furosemide  40 mg Intravenous Q12H  . gabapentin  400 mg Oral TID  . insulin aspart  0-15 Units Subcutaneous TID WC  . insulin aspart  0-5 Units Subcutaneous QHS  . insulin detemir  30 Units Subcutaneous QHS  . levothyroxine  175 mcg Oral QAC breakfast  . pantoprazole  40 mg Oral BID  . predniSONE  10 mg Oral Q breakfast  . sodium chloride flush  3 mL Intravenous Q12H  . umeclidinium bromide  1 puff Inhalation Daily   Continuous Infusions: . sodium chloride     PRN Meds: sodium chloride, acetaminophen **OR** acetaminophen, ALPRAZolam, levalbuterol, metoprolol tartrate, nitroGLYCERIN, ondansetron **OR** ondansetron (ZOFRAN) IV, polyvinyl alcohol, sodium chloride flush   Vital Signs    Vitals:   07/15/19 0630 07/15/19 0700 07/15/19 0724 07/15/19 0725  BP: (!) 105/58 106/67    Pulse:  67 (!) 103 85  Resp:  19 18 19   Temp:    97.8 F (36.6 C)  TempSrc:    Axillary  SpO2:  97% 91% 94%  Weight:      Height:        Intake/Output Summary (Last 24 hours) at 07/15/2019 0811 Last data filed at 07/15/2019 0724 Gross per 24 hour  Intake 1037.49 ml  Output 4650 ml  Net -3612.51 ml    Last 3 Weights 07/15/2019 07/14/2019 07/13/2019  Weight (lbs) 265 lb 3.4 oz 271 lb 9.7 oz 276 lb  Weight (kg) 120.3 kg 123.2 kg 125.193 kg     [facility] LPN was present throughout the entirety of the encounter.  Telemetry    Atrial fibrillation, HR in 70's to 90's at  rest but increases into the 130's to 140's with ambulation.  - Personally Reviewed  ECG    No new tracings.   Physical Exam   General: Well developed, well nourished, female appearing in no acute distress. Head: Normocephalic, atraumatic.  Neck: Supple without bruits, JVD not elevated. Lungs:  Resp regular and unlabored, CTA without wheezing or rales. Heart: Irregularly irregular. S1, S2, no S3, S4, or murmur; no rub. Abdomen: Soft, non-tender, non-distended with normoactive bowel sounds. No hepatomegaly. No rebound/guarding. No obvious abdominal masses. Extremities: No clubbing or cyanosis. Trace lower extremity edema. Distal pedal pulses are 2+ bilaterally. Neuro: Alert and oriented X 3. Moves all extremities spontaneously. Psych: Normal affect.  Labs    Chemistry Recent Labs  Lab 07/13/19 1257 07/14/19 0409 07/15/19 0400  NA 137 140 142  K 4.9 4.1 4.5  CL 107 108 101  CO2 21* 23 27  GLUCOSE 156* 148* 126*  BUN 18 15 19   CREATININE 0.76 0.69 0.93  CALCIUM 8.9 8.6* 8.8*  PROT 6.9  --   --   ALBUMIN 3.6  --   --   AST 33  --   --   ALT 24  --   --   ALKPHOS  89  --   --   BILITOT 0.3  --   --   GFRNONAA >60 >60 >60  GFRAA >60 >60 >60  ANIONGAP 9 9 14      Hematology Recent Labs  Lab 07/13/19 1257 07/14/19 0409  WBC 11.0* 9.3  RBC 4.34 4.35  HGB 10.4* 10.2*  HCT 35.8* 36.1  MCV 82.5 83.0  MCH 24.0* 23.4*  MCHC 29.1* 28.3*  RDW 17.6* 17.5*  PLT 256 241    Cardiac EnzymesNo results for input(s): TROPONINI in the last 168 hours. No results for input(s): TROPIPOC in the last 168 hours.   BNP Recent Labs  Lab 07/13/19 1257  BNP 229.0*     DDimer No results for input(s): DDIMER in the last 168 hours.   Radiology    DG Chest Port 1 View  Result Date: 07/13/2019 CLINICAL DATA:  Shortness of breath EXAM: PORTABLE CHEST 1 VIEW COMPARISON:  04/09/2018 FINDINGS: Interval removal of left-sided chest port. Heart size remains mildly enlarged. Aorta is  calcified. Mild pulmonary vascular congestion and minimal interstitial prominence. Linear scarring versus atelectasis in the mid left lung. No pleural effusion or pneumothorax. IMPRESSION: Mild cardiomegaly with pulmonary vascular congestion and minimal interstitial prominence which may reflect mild interstitial edema. Electronically Signed   By: Davina Poke D.O.   On: 07/13/2019 13:15   ECHOCARDIOGRAM COMPLETE  Result Date: 07/14/2019    ECHOCARDIOGRAM REPORT   Patient Name:   Ann [name] Date of Exam: 07/14/2019 Medical Rec #:  [id number]        Height:       63.0 in Accession #:    [id number]       Weight:       271.6 lb Date of Birth:  [birth year]         BSA:          2.20 m Patient Age:    74 years         BP:           111/99 mmHg Patient Gender: F                HR:           103 bpm. Exam Location:  [facility] Procedure: 2D Echo Indications:    Atrial Fibrillation 427.31 / I48.91  History:        Patient has prior history of Echocardiogram examinations, most                 recent 08/14/2015. CAD, COPD, Arrythmias:Atrial Fibrillation;                 Risk Factors:Dyslipidemia, Hypertension, Diabetes and Former                 Smoker.  Sonographer:    Leavy Cella RDCS (AE) Referring Phys: [phone] Royanne Foots Murphy  1. Left ventricular ejection fraction, by estimation, is 55 to 60%. The left ventricle has normal function. The left ventricle has no regional wall motion abnormalities. There is moderate concentric left ventricular hypertrophy. Left ventricular diastolic parameters are indeterminate. Elevated left ventricular end-diastolic pressure.  2. Right ventricular systolic function is normal. The right ventricular size is normal.  3. The mitral valve is grossly normal. No evidence of mitral valve regurgitation.  4. The aortic valve is tricuspid. Aortic valve regurgitation is not visualized. Mild aortic valve sclerosis is present, with no evidence of aortic valve stenosis.  5. The  inferior vena cava is normal  in size with greater than 50% respiratory variability, suggesting right atrial pressure of 3 mmHg. FINDINGS  Left Ventricle: Left ventricular ejection fraction, by estimation, is 55 to 60%. The left ventricle has normal function. The left ventricle has no regional wall motion abnormalities. The left ventricular internal cavity size was normal in size. There is  moderate concentric left ventricular hypertrophy. Left ventricular diastolic parameters are indeterminate. Elevated left ventricular end-diastolic pressure. Right Ventricle: The right ventricular size is normal. No increase in right ventricular wall thickness. Right ventricular systolic function is normal. Left Atrium: Left atrial size was normal in size. Right Atrium: Right atrial size was normal in size. Pericardium: There is no evidence of pericardial effusion. Mitral Valve: The mitral valve is grossly normal. No evidence of mitral valve regurgitation. Tricuspid Valve: The tricuspid valve is grossly normal. Tricuspid valve regurgitation is trivial. Aortic Valve: The aortic valve is tricuspid. . There is mild thickening of the aortic valve. Aortic valve regurgitation is not visualized. Mild aortic valve sclerosis is present, with no evidence of aortic valve stenosis. Mild aortic valve annular calcification. There is mild thickening of the aortic valve. Pulmonic Valve: The pulmonic valve was grossly normal. Pulmonic valve regurgitation is not visualized. Aorta: The aortic root is normal in size and structure. Venous: The inferior vena cava is normal in size with greater than 50% respiratory variability, suggesting right atrial pressure of 3 mmHg. IAS/Shunts: No atrial level shunt detected by color flow Doppler.  LEFT VENTRICLE PLAX 2D LVIDd:         5.00 cm  Diastology LVIDs:         4.11 cm  LV e' lateral:   6.85 cm/s LV PW:         1.60 cm  LV E/e' lateral: 14.7 LV IVS:        1.37 cm  LV e' medial:    4.90 cm/s LVOT diam:      2.10 cm  LV E/e' medial:  20.6 LV SV Index:   18.05 LVOT Area:     3.46 cm  RIGHT VENTRICLE RV S prime:     11.50 cm/s TAPSE (M-mode): 1.8 cm LEFT ATRIUM             Index       RIGHT ATRIUM           Index LA diam:        3.40 cm 1.54 cm/m  RA Area:     15.50 cm LA Vol (A2C):   53.9 ml 24.48 ml/m RA Volume:   35.10 ml  15.94 ml/m LA Vol (A4C):   76.4 ml 34.69 ml/m LA Biplane Vol: 65.7 ml 29.83 ml/m   AORTA Ao Root diam: 3.40 cm MITRAL VALVE MV Area (PHT): 4.39 cm     SHUNTS MV Decel Time: 173 msec     Systemic Diam: 2.10 cm MV E velocity: 101.00 cm/s MV A velocity: 35.40 cm/s MV E/A ratio:  2.85 Kate Sable MD Electronically signed by Kate Sable MD Signature Date/Time: 07/14/2019/4:37:32 PM    Final     Cardiac Studies   Echocardiogram: 07/14/2019 IMPRESSIONS    1. Left ventricular ejection fraction, by estimation, is 55 to 60%. The  left ventricle has normal function. The left ventricle has no regional  wall motion abnormalities. There is moderate concentric left ventricular  hypertrophy. Left ventricular  diastolic parameters are indeterminate. Elevated left ventricular  end-diastolic pressure.  2. Right ventricular systolic function is normal. The right ventricular  size is normal.  3. The mitral valve is grossly normal. No evidence of mitral valve  regurgitation.  4. The aortic valve is tricuspid. Aortic valve regurgitation is not  visualized. Mild aortic valve sclerosis is present, with no evidence of  aortic valve stenosis.  5. The inferior vena cava is normal in size with greater than 50%  respiratory variability, suggesting right atrial pressure of 3 mmHg.   Patient Profile     74 y.o. female w/ PMH of CAD (s/p prior stenting of LAD by review of notes), HTN, IDDM, history of DVT, chronic diastolic CHF, COPD, paroxysmal atrial fibrillation and history of DVT who was directly admitted from the office on 07/13/2019 for atrial fibrillation with RVR.    Assessment & Plan    1. Atrial Fibrillation with RVR - rates were significantly elevated at the time of admission and she was initially on IV Cardizem and this has been transitioned to PO Cardizem 30 mg Q6H. Rates mostly in the 70's to 90's at rest but peak into the 130's to 140's with minimal activity. Will titrate to 60 Q6H. If rates improve and BP remains stable, would transition to CD dosing tomorrow.  - she denies any evidence of active bleeding. Remains on Eliquis 5mg  BID for anticoagulation.   2. Acute on Chronic Diastolic CHF - BNP elevated to 229 on admission with CXR showing pulmonary vascular congestion and mild interstitial edema.  - she has been receiving IV Lasix 40mg  BID with a recorded net output of -3.5L since admission. Weight has declined from 276 lbs to 265 lbs. Renal function overall stable with creatinine at 0.93. She is below her previous baseline, therefore will switch to PO dosing. She already received IV Lasix this AM so will switch to PO tomorrow morning.   3. CAD - history of stenting to the LAD by review of prior notes. She denies any recent chest pain and HS Troponin values were flat at 21 and 26 this admission.  - repeat echo shows a preserved EF of 55-60% with no regional WMA.  - continue statin therapy. Not on ASA given the need for anticoagulation. No BB given COPD.   4. HTN - BP has been variable at 91/49 - 144/99 within the past 24 hours. Continue PO Cardizem with dose adjustment as outlined above. Would not restart PTA Amlodipine as we need to avoid dual CCB therapy. Hydralazine held to allow for further titration of AV nodal blocking agents.    For questions or updates, please contact [place] Please consult [email or web address] for contact info under Cardiology/STEMI.   Signed, Erma Heritage , PA-C 8:11 AM 07/15/2019 Pager: [phone]  The patient was seen and examined, and I agree with the history, physical exam, assessment and plan as  documented above, with modifications made above and as noted below.  The patient continues to improve with respect to shortness of breath.  She no longer has any significant leg edema.  Heart rates are controlled at rest but elevate with exertion.  Plan to increase diltiazem to 60 mg every 6 hours with hopes of transitioning to long-acting diltiazem on 07/16/2019.  She has had an output of approximately 3 liters in the past 24+ hours.  Renal function remained stable. We will transition to oral Lasix on 07/16/2019.   Kate Sable, MD, [facility]  07/15/2019 11:29 AM

## 2019-07-15 NOTE — Progress Notes (Signed)
PROGRESS NOTE [facility]   SAIA OLBRICH  [id number]  DOB: 1946-04-27  DOA: 07/13/2019 PCP: Patient, No Pcp Per   Brief Admission Hx: 74 y.o. female with medical history significant for CAD with prior LAD stent, remote history of atrial fibrillation, hypertension, insulin-dependent diabetes, adrenal insufficiency, obesity, COPD, remote history of DVT with prior use of Xarelto, and chronic diastolic heart failure with prior echo 07/2015 with LVEF 65-70%.  She was at her cardiologist's office today and complained of being short of breath for the past week and had some chest discomfort with deep inspiration.  She was found to be in Afib with RVR.    MDM/Assessment & Plan:   1. Atrial fibrillation with RVR-the patient responded well to IV Cardizem infusion and now transitioned over to oral Cardizem.  She has had some rebound tachycardia with activities and cardizem dose increased to 60 mg every 6 hours per cardiology team.  Patient will need to be monitored in the stepdown ICU with her labile heart rates and medication adjustments.   The patient is on apixaban 5 mg twice daily for full anticoagulation.  2D echo below.  Appreciate assistance of the heart care team. 2. Acute on chronic diastolic heart failure-the patient is responding well and feeling better with IV Lasix.  She is diuresing well and we are following the 2D echocardiogram below.  EF 55-60%.  Cardiology team planning to transition to oral lasix starting 07/16/19.  3. CAD status post stent placement-stable. 4. Dyslipidemia-continue atorvastatin as ordered. 5. Insulin requiring type 2 diabetes mellitus with vascular complications-he has been resumed on home basal insulin.  Holding home Metformin.  Continue to monitor CBGs and provide sliding scale coverage if needed. 6. Hypothyroidism-stable on levothyroxine continue. 7. Chronic adrenal insufficiency-she has been resumed on home prednisone.  Blood pressures have been stable.   Continue to follow closely. 8. Essential hypertension-stable follow. 9. Chronic urinary incontinence-stable. 10. GERD-continue PPI therapy.  DVT prophylaxis: apixaban Code Status: Full  Family Communication: patient  Disposition Plan: Continue care in the stepdown ICU as patient has intermittent HR elevations requiring close monitoring, medications being titrated by cardiology service, not medically safe to discharge today  Consultants:  Cardiology   Procedures:  2D Echocardiogram 07/14/19 IMPRESSIONS  1. Left ventricular ejection fraction, by estimation, is 55 to 60%. The left ventricle has normal function. The left ventricle has no regional  wall motion abnormalities. There is moderate concentric left ventricular hypertrophy. Left ventricular diastolic parameters are indeterminate. Elevated left ventricular end-diastolic pressure.  2. Right ventricular systolic function is normal. The right ventricular size is normal.  3. The mitral valve is grossly normal. No evidence of mitral valve regurgitation.  4. The aortic valve is tricuspid. Aortic valve regurgitation is not visualized. Mild aortic valve sclerosis is present, with no evidence of aortic valve stenosis.  5. The inferior vena cava is normal in size with greater than 50% respiratory variability, suggesting right atrial pressure of 3 mmHg.    Antimicrobials:     Subjective: Pt had some SOB overnight but overall feeling much better, HR increases with movement.  Objective: Vitals:   07/15/19 0700 07/15/19 0724 07/15/19 0725 07/15/19 0858  BP: 106/67     Pulse: 67 (!) 103 85   Resp: 19 18 19    Temp:   97.8 F (36.6 C)   TempSrc:   Axillary   SpO2: 97% 91% 94% 94%  Weight:      Height:  Intake/Output Summary (Last 24 hours) at 07/15/2019 0902 Last data filed at 07/15/2019 [id number] Gross per 24 hour  Intake 1037.49 ml  Output 4125 ml  Net -3087.51 ml   Filed Weights   07/13/19 1214 07/14/19 0500 07/15/19  0500  Weight: 125.2 kg 123.2 kg 120.3 kg   REVIEW OF SYSTEMS  As per history otherwise all reviewed and reported negative  Exam:  General exam: Awake, alert, sitting up in bed no apparent distress.  Cooperative. Respiratory system: Clear. No increased work of breathing. Cardiovascular system: Irregularly irregular S1 & S2 heard, tachy. No JVD, murmurs, gallops, clicks or pedal edema. Gastrointestinal system: Abdomen is nondistended, soft and nontender. Normal bowel sounds heard. Central nervous system: Alert and oriented. No focal neurological deficits. Extremities: trace pretibial edema bilateral lower extremities..  Data Reviewed: Basic Metabolic Panel: Recent Labs  Lab 07/13/19 1257 07/14/19 0409 07/15/19 0400  NA 137 140 142  K 4.9 4.1 4.5  CL 107 108 101  CO2 21* 23 27  GLUCOSE 156* 148* 126*  BUN 18 15 19   CREATININE 0.76 0.69 0.93  CALCIUM 8.9 8.6* 8.8*  MG  --  1.8 2.2   Liver Function Tests: Recent Labs  Lab 07/13/19 1257  AST 33  ALT 24  ALKPHOS 89  BILITOT 0.3  PROT 6.9  ALBUMIN 3.6   No results for input(s): LIPASE, AMYLASE in the last 168 hours. No results for input(s): AMMONIA in the last 168 hours. CBC: Recent Labs  Lab 07/13/19 1257 07/14/19 0409  WBC 11.0* 9.3  HGB 10.4* 10.2*  HCT 35.8* 36.1  MCV 82.5 83.0  PLT 256 241   Cardiac Enzymes: No results for input(s): CKTOTAL, CKMB, CKMBINDEX, TROPONINI in the last 168 hours. CBG (last 3)  Recent Labs    07/14/19 1610 07/14/19 2131 07/15/19 0740  GLUCAP 178* 125* 142*   Recent Results (from the past 240 hour(s))  Respiratory Panel by RT PCR (Flu A&B, Covid) - Nasopharyngeal Swab     Status: None   Collection Time: 07/13/19 12:43 PM   Specimen: Nasopharyngeal Swab  Result Value Ref Range Status   SARS Coronavirus 2 by RT PCR NEGATIVE NEGATIVE Final    Comment: (NOTE) SARS-CoV-2 target nucleic acids are NOT DETECTED. The SARS-CoV-2 RNA is generally detectable in upper  respiratoy specimens during the acute phase of infection. The lowest concentration of SARS-CoV-2 viral copies this assay can detect is 131 copies/mL. A negative result does not preclude SARS-Cov-2 infection and should not be used as the sole basis for treatment or other patient management decisions. A negative result may occur with  improper specimen collection/handling, submission of specimen other than nasopharyngeal swab, presence of viral mutation(s) within the areas targeted by this assay, and inadequate number of viral copies (<131 copies/mL). A negative result must be combined with clinical observations, patient history, and epidemiological information. The expected result is Negative. Fact Sheet for Patients:  PinkCheek.be Fact Sheet for Healthcare Providers:  GravelBags.it This test is not yet ap proved or cleared by the [place] FDA and  has been authorized for detection and/or diagnosis of SARS-CoV-2 by FDA under an Emergency Use Authorization (EUA). This EUA will remain  in effect (meaning this test can be used) for the duration of the COVID-19 declaration under Section 564(b)(1) of the Act, 21 U.S.C. section 360bbb-3(b)(1), unless the authorization is terminated or revoked sooner.    Influenza A by PCR NEGATIVE NEGATIVE Final   Influenza B by PCR NEGATIVE NEGATIVE Final  Comment: (NOTE) The Xpert Xpress SARS-CoV-2/FLU/RSV assay is intended as an aid in  the diagnosis of influenza from Nasopharyngeal swab specimens and  should not be used as a sole basis for treatment. Nasal washings and  aspirates are unacceptable for Xpert Xpress SARS-CoV-2/FLU/RSV  testing. Fact Sheet for Patients: PinkCheek.be Fact Sheet for Healthcare Providers: GravelBags.it This test is not yet approved or cleared by the [place] FDA and  has been authorized for  detection and/or diagnosis of SARS-CoV-2 by  FDA under an Emergency Use Authorization (EUA). This EUA will remain  in effect (meaning this test can be used) for the duration of the  Covid-19 declaration under Section 564(b)(1) of the Act, 21  U.S.C. section 360bbb-3(b)(1), unless the authorization is  terminated or revoked. Performed at [facility], [address], [place], [place] [address]   MRSA PCR Screening     Status: None   Collection Time: 07/13/19  7:05 PM   Specimen: Nasal Mucosa; Nasopharyngeal  Result Value Ref Range Status   MRSA by PCR NEGATIVE NEGATIVE Final    Comment:        The GeneXpert MRSA Assay (FDA approved for NASAL specimens only), is one component of a comprehensive MRSA colonization surveillance program. It is not intended to diagnose MRSA infection nor to guide or monitor treatment for MRSA infections. Performed at [facility], [address] [place], [place] [address]      Studies: DG Chest [facility] 1 View  Result Date: 07/13/2019 CLINICAL DATA:  Shortness of breath EXAM: PORTABLE CHEST 1 VIEW COMPARISON:  04/09/2018 FINDINGS: Interval removal of left-sided chest port. Heart size remains mildly enlarged. Aorta is calcified. Mild pulmonary vascular congestion and minimal interstitial prominence. Linear scarring versus atelectasis in the mid left lung. No pleural effusion or pneumothorax. IMPRESSION: Mild cardiomegaly with pulmonary vascular congestion and minimal interstitial prominence which may reflect mild interstitial edema. Electronically Signed   By: Davina Poke D.O.   On: 07/13/2019 13:15   ECHOCARDIOGRAM COMPLETE  Result Date: 07/14/2019    ECHOCARDIOGRAM REPORT   Patient Name:   Ann [name] Date of Exam: 07/14/2019 Medical Rec #:  [id number]        Height:       63.0 in Accession #:    [id number]       Weight:       271.6 lb Date of Birth:  May 27, 1946         BSA:          2.20 m Patient Age:    37 years         BP:           111/99 mmHg  Patient Gender: F                HR:           103 bpm. Exam Location:  [facility] Procedure: 2D Echo Indications:    Atrial Fibrillation 427.31 / I48.91  History:        Patient has prior history of Echocardiogram examinations, most                 recent 08/14/2015. CAD, COPD, Arrythmias:Atrial Fibrillation;                 Risk Factors:Dyslipidemia, Hypertension, Diabetes and Former                 Smoker.  Sonographer:    Leavy Cella RDCS (AE) Referring Phys: [phone] Amelia D  [facility] IMPRESSIONS  1. Left ventricular ejection fraction, by estimation, is 55 to 60%. The left ventricle has normal function. The left ventricle has no regional wall motion abnormalities. There is moderate concentric left ventricular hypertrophy. Left ventricular diastolic parameters are indeterminate. Elevated left ventricular end-diastolic pressure.  2. Right ventricular systolic function is normal. The right ventricular size is normal.  3. The mitral valve is grossly normal. No evidence of mitral valve regurgitation.  4. The aortic valve is tricuspid. Aortic valve regurgitation is not visualized. Mild aortic valve sclerosis is present, with no evidence of aortic valve stenosis.  5. The inferior vena cava is normal in size with greater than 50% respiratory variability, suggesting right atrial pressure of 3 mmHg. FINDINGS  Left Ventricle: Left ventricular ejection fraction, by estimation, is 55 to 60%. The left ventricle has normal function. The left ventricle has no regional wall motion abnormalities. The left ventricular internal cavity size was normal in size. There is  moderate concentric left ventricular hypertrophy. Left ventricular diastolic parameters are indeterminate. Elevated left ventricular end-diastolic pressure. Right Ventricle: The right ventricular size is normal. No increase in right ventricular wall thickness. Right ventricular systolic function is normal. Left Atrium: Left atrial size was normal in size. Right  Atrium: Right atrial size was normal in size. Pericardium: There is no evidence of pericardial effusion. Mitral Valve: The mitral valve is grossly normal. No evidence of mitral valve regurgitation. Tricuspid Valve: The tricuspid valve is grossly normal. Tricuspid valve regurgitation is trivial. Aortic Valve: The aortic valve is tricuspid. . There is mild thickening of the aortic valve. Aortic valve regurgitation is not visualized. Mild aortic valve sclerosis is present, with no evidence of aortic valve stenosis. Mild aortic valve annular calcification. There is mild thickening of the aortic valve. Pulmonic Valve: The pulmonic valve was grossly normal. Pulmonic valve regurgitation is not visualized. Aorta: The aortic root is normal in size and structure. Venous: The inferior vena cava is normal in size with greater than 50% respiratory variability, suggesting right atrial pressure of 3 mmHg. IAS/Shunts: No atrial level shunt detected by color flow Doppler.  LEFT VENTRICLE PLAX 2D LVIDd:         5.00 cm  Diastology LVIDs:         4.11 cm  LV e' lateral:   6.85 cm/s LV PW:         1.60 cm  LV E/e' lateral: 14.7 LV IVS:        1.37 cm  LV e' medial:    4.90 cm/s LVOT diam:     2.10 cm  LV E/e' medial:  20.6 LV SV Index:   18.05 LVOT Area:     3.46 cm  RIGHT VENTRICLE RV S prime:     11.50 cm/s TAPSE (M-mode): 1.8 cm LEFT ATRIUM             Index       RIGHT ATRIUM           Index LA diam:        3.40 cm 1.54 cm/m  RA Area:     15.50 cm LA Vol (A2C):   53.9 ml 24.48 ml/m RA Volume:   35.10 ml  15.94 ml/m LA Vol (A4C):   76.4 ml 34.69 ml/m LA Biplane Vol: 65.7 ml 29.83 ml/m   AORTA Ao Root diam: 3.40 cm MITRAL VALVE MV Area (PHT): 4.39 cm     SHUNTS MV Decel Time: 173 msec     Systemic Diam:  2.10 cm MV E velocity: 101.00 cm/s MV A velocity: 35.40 cm/s MV E/A ratio:  2.85 Kate Sable MD Electronically signed by Kate Sable MD Signature Date/Time: 07/14/2019/4:37:32 PM    Final    Scheduled Meds: .  apixaban  5 mg Oral BID  . atorvastatin  80 mg Oral q1800  . Chlorhexidine Gluconate Cloth  6 each Topical Daily  . cholecalciferol  5,000 Units Oral Daily  . darifenacin  15 mg Oral QHS  . diltiazem  60 mg Oral Q6H  . docusate sodium  100 mg Oral Daily  . furosemide  40 mg Oral Daily  . gabapentin  400 mg Oral TID  . insulin aspart  0-15 Units Subcutaneous TID WC  . insulin aspart  0-5 Units Subcutaneous QHS  . insulin detemir  30 Units Subcutaneous QHS  . levothyroxine  175 mcg Oral QAC breakfast  . pantoprazole  40 mg Oral BID  . predniSONE  10 mg Oral Q breakfast  . sodium chloride flush  3 mL Intravenous Q12H  . umeclidinium bromide  1 puff Inhalation Daily   Continuous Infusions: . sodium chloride      Active Problems:   Atrial fibrillation with rapid ventricular response (HCC)   Coronary artery disease of native artery of native heart with stable angina pectoris (HCC)   Chronic obstructive pulmonary disease [facility])  Critical Care Procedure Note Authorized and Performed by: Murvin Natal MD  Total Critical Care time:  32 mins Due to a high probability of clinically significant, life threatening deterioration, the patient required my highest level of preparedness to intervene emergently and I personally spent this critical care time directly and personally managing the patient.  This critical care time included obtaining a history; examining the patient, pulse oximetry; ordering and review of studies; arranging urgent treatment with development of a management plan; evaluation of patient's response of treatment; frequent reassessment; and discussions with other providers.  This critical care time was performed to assess and manage the high probability of imminent and life threatening deterioration that could result in multi-organ failure.  It was exclusive of separately billable procedures and treating other patients and teaching time.    Irwin Brakeman, MD [facility] 07/15/2019, 9:02 AM    LOS: 2 days  How to contact the [facility] Attending or Consulting provider [place] or covering provider during after hours [place], for this patient?  1. Check the care team in [facility] and look for a) attending/consulting [facility] provider listed and b) the [facility] team listed 2. Log into [email or web address] and use [place]'s universal password to access. If you do not have the password, please contact the hospital operator. 3. Locate the [facility] provider you are looking for under [facility] and page to a number that you can be directly reached. 4. If you still have difficulty reaching the provider, please page the [facility] (Director on Call) for the Hospitalists listed on amion for assistance.

## 2019-07-16 ENCOUNTER — Inpatient Hospital Stay (HOSPITAL_COMMUNITY): Payer: Medicare Other

## 2019-07-16 DIAGNOSIS — Z86718 Personal history of other venous thrombosis and embolism: Secondary | ICD-10-CM

## 2019-07-16 LAB — BASIC METABOLIC PANEL
Anion gap: 12 (ref 5–15)
BUN: 25 mg/dL — ABNORMAL HIGH (ref 8–23)
CO2: 27 mmol/L (ref 22–32)
Calcium: 9.1 mg/dL (ref 8.9–10.3)
Chloride: 100 mmol/L (ref 98–111)
Creatinine, Ser: 0.86 mg/dL (ref 0.44–1.00)
GFR calc Af Amer: >60 mL/min (ref >60)
GFR calc non Af Amer: >60 mL/min (ref >60)
Glucose, Bld: 143 mg/dL — ABNORMAL HIGH (ref 70–99)
Potassium: 4.1 mmol/L (ref 3.5–5.1)
Sodium: 139 mmol/L (ref 135–145)

## 2019-07-16 LAB — GLUCOSE, CAPILLARY
Glucose-Capillary: 128 mg/dL — ABNORMAL HIGH (ref 70–99)
Glucose-Capillary: 250 mg/dL — ABNORMAL HIGH (ref 70–99)
Glucose-Capillary: 208 mg/dL — ABNORMAL HIGH (ref 70–99)
Glucose-Capillary: 163 mg/dL — ABNORMAL HIGH (ref 70–99)

## 2019-07-16 LAB — MAGNESIUM: Magnesium: 2 mg/dL (ref 1.7–2.4)

## 2019-07-16 MED ORDER — DILTIAZEM HCL 30 MG PO TABS
30.0000 mg | ORAL_TABLET | Freq: Once | ORAL | Status: AC
  Administered 2019-07-16: 30 mg via ORAL
  Filled 2019-07-16: qty 1

## 2019-07-16 MED ORDER — DILTIAZEM HCL ER COATED BEADS 180 MG PO CP24
180.0000 mg | ORAL_CAPSULE | Freq: Every day | ORAL | Status: DC
  Administered 2019-07-16: 10:00:00 180 mg via ORAL
  Filled 2019-07-16: qty 1

## 2019-07-16 NOTE — Progress Notes (Signed)
    I was made aware by the patient's nurse her heart rate increased into the 150's when ambulating to the bedside commode. Heart rate is now in the low 100's to 110's but spikes into the 120's. She was started on Cardizem CD 180 mg today. Will write for a short acting dose of Cardizem 30 mg. Pending rates overnight and tomorrow morning, would recommend titration of Cardizem CD to 240 mg daily.  Signed, Erma Heritage, PA-C 07/16/2019, 12:49 PM Pager: [phone]

## 2019-07-16 NOTE — TOC Progression Note (Signed)
Transition of Care [facility]) - Progression Note    Patient Details  Name: Ann [name] MRN: [id number] Date of Birth: 06/14/1945  Transition of Care [facility]) CM/SW [place], LCSW Phone Number: 07/16/2019, 4:21 PM  Clinical Narrative: Provided patient with 30-day coupon for Eliquis.   Expected Discharge Plan and Services     Discharge Planning Services: Medication Assistance     Readmission Risk Interventions No flowsheet data found.

## 2019-07-16 NOTE — TOC Benefit Eligibility Note (Signed)
Transition of Care [facility]) Benefit Eligibility Note    Patient Details  Name: Ann [name] MRN: [id number] Date of Birth: 05-27-1946               Spoke with Person/Company/Phone Number:: [phone]- was on hold for a total of 50 minutes, unable to speak with a rep to verify benefits for eliquis 5mg  twice a day                Tommy Medal Phone Number: 07/16/2019, 5:01 PM

## 2019-07-16 NOTE — Progress Notes (Signed)
Patient experiencing spike in HR up to 150's when ambulating to bedside commode. Received cardizem at 0935, but still having spikes when rolling over in bed as well. Notified PA and will continue to monitor.

## 2019-07-16 NOTE — Care Management Important Message (Signed)
Important Message  Patient Details  Name: Ann [name] MRN: [id number] Date of Birth: [birth year]   Medicare Important Message Given:  Yes     Tommy Medal 07/16/2019, 5:20 PM

## 2019-07-16 NOTE — Progress Notes (Addendum)
Progress Note  Patient Name: Ann [name] Date of Encounter: 07/16/2019  Primary Cardiologist: Kate Sable, MD   Subjective   Breathing improved. No exertional chest pain or palpitations. She does report a discomfort along her chest with coughing.   Inpatient Medications    Scheduled Meds:  apixaban  5 mg Oral BID   atorvastatin  80 mg Oral q1800   Chlorhexidine Gluconate Cloth  6 each Topical Daily   cholecalciferol  5,000 Units Oral Daily   darifenacin  15 mg Oral QHS   diltiazem  60 mg Oral Q6H   docusate sodium  100 mg Oral Daily   furosemide  40 mg Oral Daily   gabapentin  400 mg Oral TID   insulin aspart  0-15 Units Subcutaneous TID WC   insulin aspart  0-5 Units Subcutaneous QHS   insulin detemir  30 Units Subcutaneous QHS   levothyroxine  175 mcg Oral QAC breakfast   pantoprazole  40 mg Oral BID   predniSONE  10 mg Oral Q breakfast   sodium chloride flush  3 mL Intravenous Q12H   umeclidinium bromide  1 puff Inhalation Daily   Continuous Infusions:  sodium chloride     PRN Meds: sodium chloride, acetaminophen **OR** acetaminophen, ALPRAZolam, guaiFENesin-dextromethorphan, levalbuterol, metoprolol tartrate, nitroGLYCERIN, ondansetron **OR** ondansetron (ZOFRAN) IV, polyvinyl alcohol, sodium chloride flush   Vital Signs    Vitals:   07/16/19 0400 07/16/19 0500 07/16/19 0600 07/16/19 0807  BP: 118/72 (!) 108/55 106/80   Pulse: 73 62 (!) 55   Resp: (!) 22 (!) 23 (!) 27   Temp: 97.8 F (36.6 C)     TempSrc: Oral     SpO2: 90% 95% (!) 89% 94%  Weight:  119.5 kg    Height:        Intake/Output Summary (Last 24 hours) at 07/16/2019 0825 Last data filed at 07/16/2019 0400 Gross per 24 hour  Intake 720 ml  Output 1350 ml  Net -630 ml    Last 3 Weights 07/16/2019 07/15/2019 07/14/2019  Weight (lbs) 263 lb 7.2 oz 265 lb 3.4 oz 271 lb 9.7 oz  Weight (kg) 119.5 kg 120.3 kg 123.2 kg      Telemetry    Atrial fibrillation, rates in  the 80's to 110's most of yesterday, in the 50's to 70's overnight with pauses up to 2.12 seconds which led to some recordings of HR in the 40's. - Personally Reviewed  ECG    No new tracings.   Physical Exam   General: Well developed, well nourished, female appearing in no acute distress. Head: Normocephalic, atraumatic.  Neck: Supple without bruits, JVD not elevated. Lungs:  Resp regular and unlabored, CTA without wheezing or rales. Heart: Irregularly irregular, S1, S2, no S3, S4, or murmur; no rub. Abdomen: Soft, non-tender, non-distended with normoactive bowel sounds. No hepatomegaly. No rebound/guarding. No obvious abdominal masses. Extremities: No clubbing, cyanosis or lower extremity edema. Distal pedal pulses are 2+ bilaterally. Neuro: Alert and oriented X 3. Moves all extremities spontaneously. Psych: Normal affect.  Labs    Chemistry Recent Labs  Lab 07/13/19 1257 07/13/19 1257 07/14/19 0409 07/15/19 0400 07/16/19 0409  NA 137   < > 140 142 139  K 4.9   < > 4.1 4.5 4.1  CL 107   < > 108 101 100  CO2 21*   < > 23 27 27   GLUCOSE 156*   < > 148* 126* 143*  BUN 18   < > 15  19 25*  CREATININE 0.76   < > 0.69 0.93 0.86  CALCIUM 8.9   < > 8.6* 8.8* 9.1  PROT 6.9  --   --   --   --   ALBUMIN 3.6  --   --   --   --   AST 33  --   --   --   --   ALT 24  --   --   --   --   ALKPHOS 89  --   --   --   --   BILITOT 0.3  --   --   --   --   GFRNONAA >60   < > >60 >60 >60  GFRAA >60   < > >60 >60 >60  ANIONGAP 9   < > 9 14 12    < > = values in this interval not displayed.     Hematology Recent Labs  Lab 07/13/19 1257 07/14/19 0409  WBC 11.0* 9.3  RBC 4.34 4.35  HGB 10.4* 10.2*  HCT 35.8* 36.1  MCV 82.5 83.0  MCH 24.0* 23.4*  MCHC 29.1* 28.3*  RDW 17.6* 17.5*  PLT 256 241    Cardiac EnzymesNo results for input(s): TROPONINI in the last 168 hours. No results for input(s): TROPIPOC in the last 168 hours.   BNP Recent Labs  Lab 07/13/19 1257  BNP 229.0*      DDimer No results for input(s): DDIMER in the last 168 hours.   Radiology    ECHOCARDIOGRAM COMPLETE  Result Date: 07/14/2019    ECHOCARDIOGRAM REPORT   Patient Name:   Ann [name] Date of Exam: 07/14/2019 Medical Rec #:  [id number]        Height:       63.0 in Accession #:    [id number]       Weight:       271.6 lb Date of Birth:  Mar 16, 1973         BSA:          2.20 m Patient Age:    74 years         BP:           111/99 mmHg Patient Gender: F                HR:           103 bpm. Exam Location:  [facility] Procedure: 2D Echo Indications:    Atrial Fibrillation 427.31 / I48.91  History:        Patient has prior history of Echocardiogram examinations, most                 recent 08/14/2015. CAD, COPD, Arrythmias:Atrial Fibrillation;                 Risk Factors:Dyslipidemia, Hypertension, Diabetes and Former                 Smoker.  Sonographer:    Leavy Cella RDCS (AE) Referring Phys: [phone] Royanne Foots [place]  1. Left ventricular ejection fraction, by estimation, is 55 to 60%. The left ventricle has normal function. The left ventricle has no regional wall motion abnormalities. There is moderate concentric left ventricular hypertrophy. Left ventricular diastolic parameters are indeterminate. Elevated left ventricular end-diastolic pressure.  2. Right ventricular systolic function is normal. The right ventricular size is normal.  3. The mitral valve is grossly normal. No evidence of mitral valve regurgitation.  4. The aortic valve is  tricuspid. Aortic valve regurgitation is not visualized. Mild aortic valve sclerosis is present, with no evidence of aortic valve stenosis.  5. The inferior vena cava is normal in size with greater than 50% respiratory variability, suggesting right atrial pressure of 3 mmHg. FINDINGS  Left Ventricle: Left ventricular ejection fraction, by estimation, is 55 to 60%. The left ventricle has normal function. The left ventricle has no regional wall motion  abnormalities. The left ventricular internal cavity size was normal in size. There is  moderate concentric left ventricular hypertrophy. Left ventricular diastolic parameters are indeterminate. Elevated left ventricular end-diastolic pressure. Right Ventricle: The right ventricular size is normal. No increase in right ventricular wall thickness. Right ventricular systolic function is normal. Left Atrium: Left atrial size was normal in size. Right Atrium: Right atrial size was normal in size. Pericardium: There is no evidence of pericardial effusion. Mitral Valve: The mitral valve is grossly normal. No evidence of mitral valve regurgitation. Tricuspid Valve: The tricuspid valve is grossly normal. Tricuspid valve regurgitation is trivial. Aortic Valve: The aortic valve is tricuspid. . There is mild thickening of the aortic valve. Aortic valve regurgitation is not visualized. Mild aortic valve sclerosis is present, with no evidence of aortic valve stenosis. Mild aortic valve annular calcification. There is mild thickening of the aortic valve. Pulmonic Valve: The pulmonic valve was grossly normal. Pulmonic valve regurgitation is not visualized. Aorta: The aortic root is normal in size and structure. Venous: The inferior vena cava is normal in size with greater than 50% respiratory variability, suggesting right atrial pressure of 3 mmHg. IAS/Shunts: No atrial level shunt detected by color flow Doppler.  LEFT VENTRICLE PLAX 2D LVIDd:         5.00 cm  Diastology LVIDs:         4.11 cm  LV e' lateral:   6.85 cm/s LV PW:         1.60 cm  LV E/e' lateral: 14.7 LV IVS:        1.37 cm  LV e' medial:    4.90 cm/s LVOT diam:     2.10 cm  LV E/e' medial:  20.6 LV SV Index:   18.05 LVOT Area:     3.46 cm  RIGHT VENTRICLE RV S prime:     11.50 cm/s TAPSE (M-mode): 1.8 cm LEFT ATRIUM             Index       RIGHT ATRIUM           Index LA diam:        3.40 cm 1.54 cm/m  RA Area:     15.50 cm LA Vol (A2C):   53.9 ml 24.48 ml/m RA  Volume:   35.10 ml  15.94 ml/m LA Vol (A4C):   76.4 ml 34.69 ml/m LA Biplane Vol: 65.7 ml 29.83 ml/m   AORTA Ao Root diam: 3.40 cm MITRAL VALVE MV Area (PHT): 4.39 cm     SHUNTS MV Decel Time: 173 msec     Systemic Diam: 2.10 cm MV E velocity: 101.00 cm/s MV A velocity: 35.40 cm/s MV E/A ratio:  2.85 Kate Sable MD Electronically signed by Kate Sable MD Signature Date/Time: 07/14/2019/4:37:32 PM    Final     Cardiac Studies   Echocardiogram: 07/14/2019 IMPRESSIONS    1. Left ventricular ejection fraction, by estimation, is 55 to 60%. The  left ventricle has normal function. The left ventricle has no regional  wall motion abnormalities. There is moderate concentric  left ventricular  hypertrophy. Left ventricular  diastolic parameters are indeterminate. Elevated left ventricular  end-diastolic pressure.  2. Right ventricular systolic function is normal. The right ventricular  size is normal.  3. The mitral valve is grossly normal. No evidence of mitral valve  regurgitation.  4. The aortic valve is tricuspid. Aortic valve regurgitation is not  visualized. Mild aortic valve sclerosis is present, with no evidence of  aortic valve stenosis.  5. The inferior vena cava is normal in size with greater than 50%  respiratory variability, suggesting right atrial pressure of 3 mmHg.   Patient Profile     74 y.o. female w/ PMH of CAD (s/p prior stenting of LAD by review of notes), HTN, IDDM, history of DVT, chronic diastolic CHF, COPD, paroxysmal atrial fibrillation and history of DVT who was directly admitted from the office on 07/13/2019 for atrial fibrillation with RVR.   Assessment & Plan    1. Atrial Fibrillation with RVR - rates were significantly elevated at the time of admission and she was initially on IV Cardizem and this has been transitioned to PO Cardizem 30 mg Q6H which was titrated to 60mg  Q6H yesterday. Rates still remain variable but were in the 80's to 110's  most of yesterday, in the 50's to 70's overnight with pauses up to 2.12 seconds which led to some recordings of HR in the 40's. Will transition short-acting Cardizem to Cardizem CD 180mg  daily later this morning (AM dose of short-acting Cardizem was held by the admitting team overnight given bradycardia). Will ask nursing staff to ambulate the patient later today to assess HR with activity. Suspect some elevation given deconditioning.  - remains on Eliquis 5mg  BID. Hgb stable at 10.2 when checked on 2/17 with platelets at 241. Will consult Case Management to provide 30-day card and to check benefits.  - could consider DCCV as an outpatient following 3 weeks of uninterrupted anticoagulation. Atria were normal in size by repeat echo this admission.   2. Acute on Chronic Diastolic CHF - BNP elevated to 229 on admission with CXR showing pulmonary vascular congestion and mild interstitial edema.  - was started on IV Lasix 40mg  BID at the time of admission with a recorded output of -4.1L thus far and weight has declined from 276 lbs to 263 lbs. Switching to PO Lasix 40mg  daily. Will need follow-up BMET as an outpatient within 7-10 days.  - the importance of reducing sodium in her diet was reviewed.   3. CAD - history of stenting to the LAD by review of prior notes with stable CAD by repeat catheterizations in 2009 and 2012. She denies any recent chest pain and HS Troponin values have been flat at 21 and 26 this admission. Echo without acute changes as outlined above.  - continue Atorvastatin 80mg  daily. Not on ASA given the use of anticoagulation and not on a BB given COPD.   4. HTN - BP variable at 85/55 - 149/131 within the past 24 hours.  Amlodipine has been discontinued given the use of Cardizem and Hydralazine held given soft BP at times.   5. Bilateral Leg Pain - tender to palpation on examination and she does have a history of DVT's. She was not on anticoagulation prior to admission and in the  setting of atrial fibrillation, she is at increased thromboembolic risk. She has been started on Eliquis. Will obtain lower extremity dopplers to rule out a DVT as this would lead to changes in her Eliquis dosing.  For questions or updates, please contact [facility] Please consult [email or web address] for contact info under Cardiology/STEMI.   Arna Medici , PA-C 8:25 AM 07/16/2019 Pager: [phone]   Attending note:  Patient seen and examined, chart reviewed.  I agree with above documentation by Ms. Strader PA-C.  Heart rate control in atrial fibrillation generally improved, some episodes of bradycardia with pauses less than 3 seconds noted overnight.  She has been on short acting diltiazem at 60 mg every 6 hours most recently with morning dose held.  She also continues on Eliquis for stroke prophylaxis.  Heart rate elevated this morning while using bedside commode.  She has had some recent leg discomfort bilaterally, no chest pain.  Blood pressure 109/82.  She is diuresed just over 4 L during hospital stay.  Lungs are without wheezing.  Cardiac exam with irregularly irregular rhythm and no gallop.  No peripheral edema.  Lab work shows potassium 4.1, BUN 25, creatinine 0.86.  Recent echocardiogram report reviewed, LVEF 55 to 60%.  Agree with transition to Cardizem CD 180 mg daily for now, continue Eliquis for stroke prophylaxis.  Also switching to oral Lasix at this point.  Obtaining lower extremity venous Dopplers to exclude DVT (has history of this, was not anticoagulated recently as an outpatient).  Hopefully, heart rate control will remain adequate on Cardizem CD and patient can be moved out to telemetry.  Follow blood pressure, note that Norvasc and hydralazine have been discontinued at this point.  If she continues to improve, would anticipate discharge within the next 24 to 48 hours.  Satira Sark, M.D., F.A.C.C.

## 2019-07-16 NOTE — Progress Notes (Signed)
PROGRESS NOTE [facility]   Ann [name]  [id number]  DOB: Sep 06, 1945  DOA: 07/13/2019 PCP: Patient, No Pcp Per   Brief Admission Hx: 74 y.o. female with medical history significant for CAD with prior LAD stent, remote history of atrial fibrillation, hypertension, insulin-dependent diabetes, adrenal insufficiency, obesity, COPD, remote history of DVT with prior use of Xarelto, and chronic diastolic heart failure with prior echo 07/2015 with LVEF 65-70%.  She was at her cardiologist's office today and complained of being short of breath for the past week and had some chest discomfort with deep inspiration.  She was found to be in Afib with RVR.    MDM/Assessment & Plan:   1. Atrial fibrillation with RVR-the patient responded well to IV Cardizem infusion and now transitioned over to oral Cardizem.  She has had some rebound tachycardia with activities and cardizem dose increased to 60 mg every 6 hours per cardiology team and now transitioning to oral cardizem CD 180 mg.  Patient will need to be monitored in the stepdown ICU with her labile heart rates and medication adjustments.   The patient is on apixaban 5 mg twice daily for full anticoagulation.  2D echo below.  Appreciate assistance of the heart care team. 2. Acute on chronic diastolic heart failure-the patient is responding well and feeling better with IV Lasix.  She is diuresing well and we are following the 2D echocardiogram below.  EF 55-60%.  Cardiology team transitioned to oral lasix 07/16/19.  3. CAD status post stent placement-stable. 4. Dyslipidemia-continue atorvastatin as ordered. 5. Insulin requiring type 2 diabetes mellitus with vascular complications-he has been resumed on home basal insulin.  Holding home Metformin.  Continue to monitor CBGs and provide sliding scale coverage if needed. 6. Hypothyroidism-stable on levothyroxine continue. 7. Chronic adrenal insufficiency-she has been resumed on home prednisone.  Blood  pressures have been stable.  Continue to follow closely. 8. Essential hypertension-stable follow. 9. Chronic urinary incontinence-stable. 10. GERD-continue PPI therapy.  DVT prophylaxis: apixaban Code Status: Full  Family Communication: patient  Disposition Plan: Continue care in the stepdown ICU as patient has intermittent HR elevations requiring close monitoring, medications being titrated by cardiology service, not medically safe to discharge at this time, but if remains stable, would likely discharge in next 1-2 days.   Consultants:  Cardiology   Procedures:  2D Echocardiogram 07/14/19 IMPRESSIONS  1. Left ventricular ejection fraction, by estimation, is 55 to 60%. The left ventricle has normal function. The left ventricle has no regional  wall motion abnormalities. There is moderate concentric left ventricular hypertrophy. Left ventricular diastolic parameters are indeterminate. Elevated left ventricular end-diastolic pressure.  2. Right ventricular systolic function is normal. The right ventricular size is normal.  3. The mitral valve is grossly normal. No evidence of mitral valve regurgitation.  4. The aortic valve is tricuspid. Aortic valve regurgitation is not visualized. Mild aortic valve sclerosis is present, with no evidence of aortic valve stenosis.  5. The inferior vena cava is normal in size with greater than 50% respiratory variability, suggesting right atrial pressure of 3 mmHg.    Antimicrobials:     Subjective: Pt without complaints, HR goes up with movement but mostly asymptomatic to patient.  Objective: Vitals:   07/16/19 0845 07/16/19 1113 07/16/19 1345 07/16/19 1605  BP: 109/82  123/64   Pulse: 71  96   Resp: 18  20   Temp:  98.1 F (36.7 C)  98.3 F (36.8 C)  TempSrc:  Oral  Oral  SpO2: 96%  94%   Weight:      Height:        Intake/Output Summary (Last 24 hours) at 07/16/2019 1644 Last data filed at 07/16/2019 1500 Gross per 24 hour  Intake  980 ml  Output 1600 ml  Net -620 ml   Filed Weights   07/14/19 0500 07/15/19 0500 07/16/19 0500  Weight: 123.2 kg 120.3 kg 119.5 kg   REVIEW OF SYSTEMS  As per history otherwise all reviewed and reported negative  Exam:  General exam: Awake, alert, sitting up in bed no apparent distress.  Cooperative. Respiratory system: Clear. No increased work of breathing. Cardiovascular system: Irregularly irregular S1 & S2 heard. No JVD, murmurs, gallops, clicks or pedal edema. Gastrointestinal system: Abdomen is nondistended, soft and nontender. Normal bowel sounds heard. Central nervous system: Alert and oriented. No focal neurological deficits. Extremities: trace pretibial edema bilateral lower extremities..  Data Reviewed: Basic Metabolic Panel: Recent Labs  Lab 07/13/19 1257 07/14/19 0409 07/15/19 0400 07/16/19 0409  NA 137 140 142 139  K 4.9 4.1 4.5 4.1  CL 107 108 101 100  CO2 21* 23 27 27   GLUCOSE 156* 148* 126* 143*  BUN 18 15 19  25*  CREATININE 0.76 0.69 0.93 0.86  CALCIUM 8.9 8.6* 8.8* 9.1  MG  --  1.8 2.2 2.0   Liver Function Tests: Recent Labs  Lab 07/13/19 1257  AST 33  ALT 24  ALKPHOS 89  BILITOT 0.3  PROT 6.9  ALBUMIN 3.6   No results for input(s): LIPASE, AMYLASE in the last 168 hours. No results for input(s): AMMONIA in the last 168 hours. CBC: Recent Labs  Lab 07/13/19 1257 07/14/19 0409  WBC 11.0* 9.3  HGB 10.4* 10.2*  HCT 35.8* 36.1  MCV 82.5 83.0  PLT 256 241   Cardiac Enzymes: No results for input(s): CKTOTAL, CKMB, CKMBINDEX, TROPONINI in the last 168 hours. CBG (last 3)  Recent Labs    07/15/19 2038 07/16/19 0740 07/16/19 1113  GLUCAP 151* 128* 250*   Recent Results (from the past 240 hour(s))  Respiratory Panel by RT PCR (Flu A&B, Covid) - Nasopharyngeal Swab     Status: None   Collection Time: 07/13/19 12:43 PM   Specimen: Nasopharyngeal Swab  Result Value Ref Range Status   SARS Coronavirus 2 by RT PCR NEGATIVE NEGATIVE  Final    Comment: (NOTE) SARS-CoV-2 target nucleic acids are NOT DETECTED. The SARS-CoV-2 RNA is generally detectable in upper respiratoy specimens during the acute phase of infection. The lowest concentration of SARS-CoV-2 viral copies this assay can detect is 131 copies/mL. A negative result does not preclude SARS-Cov-2 infection and should not be used as the sole basis for treatment or other patient management decisions. A negative result may occur with  improper specimen collection/handling, submission of specimen other than nasopharyngeal swab, presence of viral mutation(s) within the areas targeted by this assay, and inadequate number of viral copies (<131 copies/mL). A negative result must be combined with clinical observations, patient history, and epidemiological information. The expected result is Negative. Fact Sheet for Patients:  PinkCheek.be Fact Sheet for Healthcare Providers:  GravelBags.it This test is not yet ap proved or cleared by the [place] FDA and  has been authorized for detection and/or diagnosis of SARS-CoV-2 by FDA under an Emergency Use Authorization (EUA). This EUA will remain  in effect (meaning this test can be used) for the duration of the COVID-19 declaration under Section 564(b)(1) of the Act, 21 U.S.C. section  360bbb-3(b)(1), unless the authorization is terminated or revoked sooner.    Influenza A by PCR NEGATIVE NEGATIVE Final   Influenza B by PCR NEGATIVE NEGATIVE Final    Comment: (NOTE) The Xpert Xpress SARS-CoV-2/FLU/RSV assay is intended as an aid in  the diagnosis of influenza from Nasopharyngeal swab specimens and  should not be used as a sole basis for treatment. Nasal washings and  aspirates are unacceptable for Xpert Xpress SARS-CoV-2/FLU/RSV  testing. Fact Sheet for Patients: PinkCheek.be Fact Sheet for Healthcare  Providers: GravelBags.it This test is not yet approved or cleared by the [place] FDA and  has been authorized for detection and/or diagnosis of SARS-CoV-2 by  FDA under an Emergency Use Authorization (EUA). This EUA will remain  in effect (meaning this test can be used) for the duration of the  Covid-19 declaration under Section 564(b)(1) of the Act, 21  U.S.C. section 360bbb-3(b)(1), unless the authorization is  terminated or revoked. Performed at [facility], [address] [place], [place] [address]   MRSA PCR Screening     Status: None   Collection Time: 07/13/19  7:05 PM   Specimen: Nasal Mucosa; Nasopharyngeal  Result Value Ref Range Status   MRSA by PCR NEGATIVE NEGATIVE Final    Comment:        The GeneXpert MRSA Assay (FDA approved for NASAL specimens only), is one component of a comprehensive MRSA colonization surveillance program. It is not intended to diagnose MRSA infection nor to guide or monitor treatment for MRSA infections. Performed at [facility], [address], [place], [place] [address]      Studies: US Venous Img Lower Bilateral (DVT)  Result Date: 07/16/2019 CLINICAL DATA:  History of previous lower extremity DVT (left popliteal vein). Evaluate for acute or chronic DVT. EXAM: BILATERAL LOWER EXTREMITY VENOUS DOPPLER ULTRASOUND TECHNIQUE: Gray-scale sonography with graded compression, as well as color Doppler and duplex ultrasound were performed to evaluate the lower extremity deep venous systems from the level of the common femoral vein and including the common femoral, femoral, profunda femoral, popliteal and calf veins including the posterior tibial, peroneal and gastrocnemius veins when visible. The superficial great saphenous vein was also interrogated. Spectral Doppler was utilized to evaluate flow at rest and with distal augmentation maneuvers in the common femoral, femoral and popliteal veins. COMPARISON:  Left lower  extremity venous Doppler ultrasound - 04/01/2012 FINDINGS: RIGHT LOWER EXTREMITY Common Femoral Vein: No evidence of thrombus. Normal compressibility, respiratory phasicity and response to augmentation. Saphenofemoral Junction: No evidence of thrombus. Normal compressibility and flow on color Doppler imaging. Profunda Femoral Vein: No evidence of thrombus. Normal compressibility and flow on color Doppler imaging. Femoral Vein: No evidence of thrombus. Normal compressibility, respiratory phasicity and response to augmentation. Popliteal Vein: No evidence of thrombus. Normal compressibility, respiratory phasicity and response to augmentation. Calf Veins: No evidence of thrombus. Normal compressibility and flow on color Doppler imaging. Superficial Great Saphenous Vein: No evidence of thrombus. Normal compressibility. Venous Reflux:  None. Other Findings:  None. LEFT LOWER EXTREMITY Common Femoral Vein: No evidence of thrombus. Normal compressibility, respiratory phasicity and response to augmentation. Saphenofemoral Junction: No evidence of thrombus. Normal compressibility and flow on color Doppler imaging. Profunda Femoral Vein: No evidence of thrombus. Normal compressibility and flow on color Doppler imaging. Femoral Vein: No evidence of thrombus. Normal compressibility, respiratory phasicity and response to augmentation. Popliteal Vein: No evidence of acute or chronic thrombus. Normal compressibility, respiratory phasicity and response to augmentation. Calf Veins: No evidence of thrombus.  Normal compressibility and flow on color Doppler imaging. Superficial Great Saphenous Vein: No evidence of thrombus. Normal compressibility. Venous Reflux:  None. Other Findings:  None. IMPRESSION: No evidence of acute or chronic DVT within either lower extremity with special attention paid to the left popliteal vein. Electronically Signed   By: Sandi Mariscal M.D.   On: 07/16/2019 12:47   Scheduled Meds: . apixaban  5 mg Oral BID   . atorvastatin  80 mg Oral q1800  . Chlorhexidine Gluconate Cloth  6 each Topical Daily  . cholecalciferol  5,000 Units Oral Daily  . darifenacin  15 mg Oral QHS  . diltiazem  180 mg Oral Daily  . docusate sodium  100 mg Oral Daily  . furosemide  40 mg Oral Daily  . gabapentin  400 mg Oral TID  . insulin aspart  0-15 Units Subcutaneous TID WC  . insulin aspart  0-5 Units Subcutaneous QHS  . insulin detemir  30 Units Subcutaneous QHS  . levothyroxine  175 mcg Oral QAC breakfast  . pantoprazole  40 mg Oral BID  . predniSONE  10 mg Oral Q breakfast  . sodium chloride flush  3 mL Intravenous Q12H  . umeclidinium bromide  1 puff Inhalation Daily   Continuous Infusions: . sodium chloride      Active Problems:   Atrial fibrillation with rapid ventricular response (HCC)   Coronary artery disease of native artery of native heart with stable angina pectoris (HCC)   Chronic obstructive pulmonary disease [facility])  Critical Care Procedure Note Authorized and Performed by: Murvin Natal MD  Total Critical Care time:  30 mins Due to a high probability of clinically significant, life threatening deterioration, the patient required my highest level of preparedness to intervene emergently and I personally spent this critical care time directly and personally managing the patient.  This critical care time included obtaining a history; examining the patient, pulse oximetry; ordering and review of studies; arranging urgent treatment with development of a management plan; evaluation of patient's response of treatment; frequent reassessment; and discussions with other providers.  This critical care time was performed to assess and manage the high probability of imminent and life threatening deterioration that could result in multi-organ failure.  It was exclusive of separately billable procedures and treating other patients and teaching time.    Irwin Brakeman, MD [facility] 07/16/2019, 4:44 PM     LOS: 3 days  How to contact the [facility] Attending or Consulting provider [place] or covering provider during after hours [place], for this patient?  1. Check the care team in [facility] and look for a) attending/consulting TR[facility] provider listed and b) the [facility] team listed 2. Log into [email or web address] and use 's universal password to access. If you do not have the password, please contact the hospital operator. 3. Locate the [facility] provider you are looking for under [facility] and page to a number that you can be directly reached. 4. If you still have difficulty reaching the provider, please page the [facility] (Director on Call) for the Hospitalists listed on amion for assistance.

## 2019-07-17 LAB — BASIC METABOLIC PANEL
Anion gap: 10 (ref 5–15)
BUN: 27 mg/dL — ABNORMAL HIGH (ref 8–23)
CO2: 26 mmol/L (ref 22–32)
Calcium: 9.2 mg/dL (ref 8.9–10.3)
Chloride: 103 mmol/L (ref 98–111)
Creatinine, Ser: 0.83 mg/dL (ref 0.44–1.00)
GFR calc Af Amer: >60 mL/min (ref >60)
GFR calc non Af Amer: >60 mL/min (ref >60)
Glucose, Bld: 157 mg/dL — ABNORMAL HIGH (ref 70–99)
Potassium: 3.8 mmol/L (ref 3.5–5.1)
Sodium: 139 mmol/L (ref 135–145)

## 2019-07-17 LAB — GLUCOSE, CAPILLARY
Glucose-Capillary: 154 mg/dL — ABNORMAL HIGH (ref 70–99)
Glucose-Capillary: 204 mg/dL — ABNORMAL HIGH (ref 70–99)
Glucose-Capillary: 174 mg/dL — ABNORMAL HIGH (ref 70–99)
Glucose-Capillary: 149 mg/dL — ABNORMAL HIGH (ref 70–99)

## 2019-07-17 LAB — MAGNESIUM: Magnesium: 1.9 mg/dL (ref 1.7–2.4)

## 2019-07-17 MED ORDER — DILTIAZEM HCL ER COATED BEADS 240 MG PO CP24
240.0000 mg | ORAL_CAPSULE | Freq: Every day | ORAL | Status: DC
  Administered 2019-07-17 – 2019-07-18 (×2): 240 mg via ORAL
  Filled 2019-07-17 (×2): qty 1

## 2019-07-17 MED ORDER — SALINE SPRAY 0.65 % NA SOLN
1.0000 | NASAL | Status: DC | PRN
  Administered 2019-07-17: 1 via NASAL
  Filled 2019-07-17: qty 44

## 2019-07-17 MED ORDER — METOPROLOL TARTRATE 5 MG/5ML IV SOLN
2.5000 mg | Freq: Once | INTRAVENOUS | Status: AC
  Administered 2019-07-17: 2.5 mg via INTRAVENOUS
  Filled 2019-07-17: qty 5

## 2019-07-17 MED ORDER — BISACODYL 10 MG RE SUPP
10.0000 mg | Freq: Once | RECTAL | Status: AC
  Administered 2019-07-17: 10 mg via RECTAL
  Filled 2019-07-17: qty 1

## 2019-07-17 MED ORDER — POLYETHYLENE GLYCOL 3350 17 G PO PACK
17.0000 g | PACK | Freq: Two times a day (BID) | ORAL | Status: DC
  Administered 2019-07-17 (×2): 17 g via ORAL
  Filled 2019-07-17 (×3): qty 1

## 2019-07-17 NOTE — Progress Notes (Addendum)
PROGRESS NOTE [facility]  Ann [name]  [id number]  DOB: Jul 15, 1945  DOA: 07/13/2019 PCP: Patient, No Pcp Per   Brief Admission Hx: 74 y.o. female with medical history significant for CAD with prior LAD stent, remote history of atrial fibrillation, hypertension, insulin-dependent diabetes, adrenal insufficiency, obesity, COPD, remote history of DVT with prior use of Xarelto, and chronic diastolic heart failure with prior echo 07/2015 with LVEF 65-70%.  She was at her cardiologist's office today and complained of being short of breath for the past week and had some chest discomfort with deep inspiration.  She was found to be in Afib with RVR.    MDM/Assessment & Plan:   1. Atrial fibrillation with RVR-the patient responded well to IV Cardizem infusion and now transitioned over to oral Cardizem.  She has had some rebound tachycardia with activities and cardizem dose increased to 60 mg every 6 hours per cardiology team and now increasing oral cardizem CD 240 mg.  Pt continues to have some feeling of palpitations and will need to be monitored in the stepdown ICU with her labile heart rates and medication adjustments.   The patient is on apixaban 5 mg twice daily for full anticoagulation.  2D echo below.  Appreciate assistance of the heart care team. 2. Acute on chronic diastolic heart failure-the patient is responding well and feeling better with IV Lasix.  She is diuresing well and we are following the 2D echocardiogram below.  EF 55-60%.  Cardiology team transitioned to oral lasix 07/16/19.  3. CAD status post stent placement-stable. 4. Dyslipidemia-continue atorvastatin as ordered. 5. Insulin requiring type 2 diabetes mellitus with vascular complications-he has been resumed on home basal insulin.  Holding home Metformin.  Continue to monitor CBGs and provide sliding scale coverage if needed. 6. Hypothyroidism-stable on levothyroxine continue. 7. Chronic adrenal insufficiency-she has  been resumed on home prednisone.  Blood pressures have been stable.  Continue to follow closely. 8. Essential hypertension-stable follow. 9. Chronic urinary incontinence-stable. 10. GERD-continue PPI therapy. 11. Constipation - added miralax, suppository  DVT prophylaxis: apixaban Code Status: Full  Family Communication: patient  Disposition Plan: Continue care in the stepdown ICU as patient has intermittent HR elevations requiring close monitoring, medications being titrated by cardiology and primary service, not medically safe to discharge at this time.   Consultants:  Cardiology   Procedures:  2D Echocardiogram 07/14/19 IMPRESSIONS  1. Left ventricular ejection fraction, by estimation, is 55 to 60%. The left ventricle has normal function. The left ventricle has no regional  wall motion abnormalities. There is moderate concentric left ventricular hypertrophy. Left ventricular diastolic parameters are indeterminate. Elevated left ventricular end-diastolic pressure.  2. Right ventricular systolic function is normal. The right ventricular size is normal.  3. The mitral valve is grossly normal. No evidence of mitral valve regurgitation.  4. The aortic valve is tricuspid. Aortic valve regurgitation is not visualized. Mild aortic valve sclerosis is present, with no evidence of aortic valve stenosis.  5. The inferior vena cava is normal in size with greater than 50% respiratory variability, suggesting right atrial pressure of 3 mmHg.    Antimicrobials:     Subjective: Pt can feel palpitations, pt reports constipation and request laxatives.    Objective: Vitals:   07/17/19 1200 07/17/19 1300 07/17/19 1400 07/17/19 1500  BP: 131/86 133/65 (!) 148/95 (!) 116/51  Pulse: (!) 104 89 90 75  Resp: (!) 22 (!) 28 19 (!) 30  Temp:      TempSrc:  SpO2: 93% 93% 95% 97%  Weight:      Height:        Intake/Output Summary (Last 24 hours) at 07/17/2019 1548 Last data filed at  07/17/2019 1500 Gross per 24 hour  Intake 483 ml  Output --  Net 483 ml   Filed Weights   07/14/19 0500 07/15/19 0500 07/16/19 0500  Weight: 123.2 kg 120.3 kg 119.5 kg   REVIEW OF SYSTEMS  As per history otherwise all reviewed and reported negative  Exam:  General exam: Awake, alert, sitting up in bed no apparent distress.  Cooperative. Respiratory system: Clear. No increased work of breathing. Cardiovascular system: Irregularly irregular S1 & S2 heard. No JVD, murmurs, gallops, clicks or pedal edema. Gastrointestinal system: Abdomen is nondistended, soft and nontender. Normal bowel sounds heard. Central nervous system: Alert and oriented. No focal neurological deficits. Extremities: trace pretibial edema bilateral lower extremities..  Data Reviewed: Basic Metabolic Panel: Recent Labs  Lab 07/13/19 1257 07/14/19 0409 07/15/19 0400 07/16/19 0409 07/17/19 0514  NA 137 140 142 139 139  K 4.9 4.1 4.5 4.1 3.8  CL 107 108 101 100 103  CO2 21* 23 27 27 26   GLUCOSE 156* 148* 126* 143* 157*  BUN 18 15 19  25* 27*  CREATININE 0.76 0.69 0.93 0.86 0.83  CALCIUM 8.9 8.6* 8.8* 9.1 9.2  MG  --  1.8 2.2 2.0 1.9   Liver Function Tests: Recent Labs  Lab 07/13/19 1257  AST 33  ALT 24  ALKPHOS 89  BILITOT 0.3  PROT 6.9  ALBUMIN 3.6   No results for input(s): LIPASE, AMYLASE in the last 168 hours. No results for input(s): AMMONIA in the last 168 hours. CBC: Recent Labs  Lab 07/13/19 1257 07/14/19 0409  WBC 11.0* 9.3  HGB 10.4* 10.2*  HCT 35.8* 36.1  MCV 82.5 83.0  PLT 256 241   Cardiac Enzymes: No results for input(s): CKTOTAL, CKMB, CKMBINDEX, TROPONINI in the last 168 hours. CBG (last 3)  Recent Labs    07/16/19 2133 07/17/19 0800 07/17/19 1125  GLUCAP 163* 154* 204*   Recent Results (from the past 240 hour(s))  Respiratory Panel by RT PCR (Flu A&B, Covid) - Nasopharyngeal Swab     Status: None   Collection Time: 07/13/19 12:43 PM   Specimen: Nasopharyngeal  Swab  Result Value Ref Range Status   SARS Coronavirus 2 by RT PCR NEGATIVE NEGATIVE Final    Comment: (NOTE) SARS-CoV-2 target nucleic acids are NOT DETECTED. The SARS-CoV-2 RNA is generally detectable in upper respiratoy specimens during the acute phase of infection. The lowest concentration of SARS-CoV-2 viral copies this assay can detect is 131 copies/mL. A negative result does not preclude SARS-Cov-2 infection and should not be used as the sole basis for treatment or other patient management decisions. A negative result may occur with  improper specimen collection/handling, submission of specimen other than nasopharyngeal swab, presence of viral mutation(s) within the areas targeted by this assay, and inadequate number of viral copies (<131 copies/mL). A negative result must be combined with clinical observations, patient history, and epidemiological information. The expected result is Negative. Fact Sheet for Patients:  PinkCheek.be Fact Sheet for Healthcare Providers:  GravelBags.it This test is not yet ap proved or cleared by the [place] FDA and  has been authorized for detection and/or diagnosis of SARS-CoV-2 by FDA under an Emergency Use Authorization (EUA). This EUA will remain  in effect (meaning this test can be used) for the duration of the COVID-19  declaration under Section 564(b)(1) of the Act, 21 U.S.C. section 360bbb-3(b)(1), unless the authorization is terminated or revoked sooner.    Influenza A by PCR NEGATIVE NEGATIVE Final   Influenza B by PCR NEGATIVE NEGATIVE Final    Comment: (NOTE) The Xpert Xpress SARS-CoV-2/FLU/RSV assay is intended as an aid in  the diagnosis of influenza from Nasopharyngeal swab specimens and  should not be used as a sole basis for treatment. Nasal washings and  aspirates are unacceptable for Xpert Xpress SARS-CoV-2/FLU/RSV  testing. Fact Sheet for  Patients: PinkCheek.be Fact Sheet for Healthcare Providers: GravelBags.it This test is not yet approved or cleared by the [place] FDA and  has been authorized for detection and/or diagnosis of SARS-CoV-2 by  FDA under an Emergency Use Authorization (EUA). This EUA will remain  in effect (meaning this test can be used) for the duration of the  Covid-19 declaration under Section 564(b)(1) of the Act, 21  U.S.C. section 360bbb-3(b)(1), unless the authorization is  terminated or revoked. Performed at [facility], [address], [place], [place] [address]   MRSA PCR Screening     Status: None   Collection Time: 07/13/19  7:05 PM   Specimen: Nasal Mucosa; Nasopharyngeal  Result Value Ref Range Status   MRSA by PCR NEGATIVE NEGATIVE Final    Comment:        The GeneXpert MRSA Assay (FDA approved for NASAL specimens only), is one component of a comprehensive MRSA colonization surveillance program. It is not intended to diagnose MRSA infection nor to guide or monitor treatment for MRSA infections. Performed at [facility], [address], [place], [place] [address]      Studies: US Venous Img Lower Bilateral (DVT)  Result Date: 07/16/2019 CLINICAL DATA:  History of previous lower extremity DVT (left popliteal vein). Evaluate for acute or chronic DVT. EXAM: BILATERAL LOWER EXTREMITY VENOUS DOPPLER ULTRASOUND TECHNIQUE: Gray-scale sonography with graded compression, as well as color Doppler and duplex ultrasound were performed to evaluate the lower extremity deep venous systems from the level of the common femoral vein and including the common femoral, femoral, profunda femoral, popliteal and calf veins including the posterior tibial, peroneal and gastrocnemius veins when visible. The superficial great saphenous vein was also interrogated. Spectral Doppler was utilized to evaluate flow at rest and with distal augmentation  maneuvers in the common femoral, femoral and popliteal veins. COMPARISON:  Left lower extremity venous Doppler ultrasound - 04/01/2012 FINDINGS: RIGHT LOWER EXTREMITY Common Femoral Vein: No evidence of thrombus. Normal compressibility, respiratory phasicity and response to augmentation. Saphenofemoral Junction: No evidence of thrombus. Normal compressibility and flow on color Doppler imaging. Profunda Femoral Vein: No evidence of thrombus. Normal compressibility and flow on color Doppler imaging. Femoral Vein: No evidence of thrombus. Normal compressibility, respiratory phasicity and response to augmentation. Popliteal Vein: No evidence of thrombus. Normal compressibility, respiratory phasicity and response to augmentation. Calf Veins: No evidence of thrombus. Normal compressibility and flow on color Doppler imaging. Superficial Great Saphenous Vein: No evidence of thrombus. Normal compressibility. Venous Reflux:  None. Other Findings:  None. LEFT LOWER EXTREMITY Common Femoral Vein: No evidence of thrombus. Normal compressibility, respiratory phasicity and response to augmentation. Saphenofemoral Junction: No evidence of thrombus. Normal compressibility and flow on color Doppler imaging. Profunda Femoral Vein: No evidence of thrombus. Normal compressibility and flow on color Doppler imaging. Femoral Vein: No evidence of thrombus. Normal compressibility, respiratory phasicity and response to augmentation. Popliteal Vein: No evidence of acute or chronic thrombus. Normal compressibility, respiratory phasicity  and response to augmentation. Calf Veins: No evidence of thrombus. Normal compressibility and flow on color Doppler imaging. Superficial Great Saphenous Vein: No evidence of thrombus. Normal compressibility. Venous Reflux:  None. Other Findings:  None. IMPRESSION: No evidence of acute or chronic DVT within either lower extremity with special attention paid to the left popliteal vein. Electronically Signed   By:  Sandi Mariscal M.D.   On: 07/16/2019 12:47   Scheduled Meds: . apixaban  5 mg Oral BID  . atorvastatin  80 mg Oral q1800  . bisacodyl  10 mg Rectal Once  . Chlorhexidine Gluconate Cloth  6 each Topical Daily  . cholecalciferol  5,000 Units Oral Daily  . darifenacin  15 mg Oral QHS  . diltiazem  240 mg Oral Daily  . docusate sodium  100 mg Oral Daily  . furosemide  40 mg Oral Daily  . gabapentin  400 mg Oral TID  . insulin aspart  0-15 Units Subcutaneous TID WC  . insulin aspart  0-5 Units Subcutaneous QHS  . insulin detemir  30 Units Subcutaneous QHS  . levothyroxine  175 mcg Oral QAC breakfast  . pantoprazole  40 mg Oral BID  . polyethylene glycol  17 g Oral BID  . predniSONE  10 mg Oral Q breakfast  . sodium chloride flush  3 mL Intravenous Q12H  . umeclidinium bromide  1 puff Inhalation Daily   Continuous Infusions: . sodium chloride      Active Problems:   Atrial fibrillation with rapid ventricular response (HCC)   Coronary artery disease of native artery of native heart with stable angina pectoris (HCC)   Chronic obstructive pulmonary disease (HCC)   History of DVT (deep vein thrombosis)  Critical Care Procedure Note Authorized and Performed by: Murvin Natal MD  Total Critical Care time:  32 mins Due to a high probability of clinically significant, life threatening deterioration, the patient required my highest level of preparedness to intervene emergently and I personally spent this critical care time directly and personally managing the patient.  This critical care time included obtaining a history; examining the patient, pulse oximetry; ordering and review of studies; arranging urgent treatment with development of a management plan; evaluation of patient's response of treatment; frequent reassessment; and discussions with other providers.  This critical care time was performed to assess and manage the high probability of imminent and life threatening deterioration that could  result in multi-organ failure.  It was exclusive of separately billable procedures and treating other patients and teaching time.   Irwin Brakeman, MD [facility] 07/17/2019, 3:48 PM    LOS: 4 days  How to contact the [facility] Attending or Consulting provider [place] or covering provider during after hours [place], for this patient?  1. Check the care team in [facility] and look for a) attending/consulting [facility] provider listed and b) the [facility] team listed 2. Log into [email or web address] and use [place]'s universal password to access. If you do not have the password, please contact the hospital operator. 3. Locate the [facility] provider you are looking for under [facility] and page to a number that you can be directly reached. 4. If you still have difficulty reaching the provider, please page the [facility] (Director on Call) for the Hospitalists listed on amion for assistance.

## 2019-07-18 LAB — BASIC METABOLIC PANEL
Anion gap: 9 (ref 5–15)
BUN: 32 mg/dL — ABNORMAL HIGH (ref 8–23)
CO2: 27 mmol/L (ref 22–32)
Calcium: 9.1 mg/dL (ref 8.9–10.3)
Chloride: 102 mmol/L (ref 98–111)
Creatinine, Ser: 0.94 mg/dL (ref 0.44–1.00)
GFR calc Af Amer: >60 mL/min (ref >60)
GFR calc non Af Amer: >60 mL/min (ref >60)
Glucose, Bld: 155 mg/dL — ABNORMAL HIGH (ref 70–99)
Potassium: 4 mmol/L (ref 3.5–5.1)
Sodium: 138 mmol/L (ref 135–145)

## 2019-07-18 LAB — GLUCOSE, CAPILLARY
Glucose-Capillary: 159 mg/dL — ABNORMAL HIGH (ref 70–99)
Glucose-Capillary: 311 mg/dL — ABNORMAL HIGH (ref 70–99)

## 2019-07-18 MED ORDER — DILTIAZEM HCL ER COATED BEADS 240 MG PO CP24: 240.0000 mg | ORAL_CAPSULE | Freq: Every day | ORAL | 1 refills | Status: DC

## 2019-07-18 MED ORDER — POLYETHYLENE GLYCOL 3350 17 G PO PACK: 17.0000 g | PACK | Freq: Two times a day (BID) | ORAL | 0 refills | Status: AC

## 2019-07-18 MED ORDER — FUROSEMIDE 40 MG PO TABS: 40.0000 mg | ORAL_TABLET | Freq: Every day | ORAL | 0 refills | Status: DC

## 2019-07-18 MED ORDER — APIXABAN 5 MG PO TABS: 5.0000 mg | ORAL_TABLET | Freq: Two times a day (BID) | ORAL | 0 refills | Status: DC

## 2019-07-18 NOTE — Discharge Summary (Signed)
Physician Discharge Summary  Ann [name] [id number] DOB: February 17, 1946 DOA: 07/13/2019  PCP: Maryruth Hancock, MD Cardiology: Karalee Height   Admit date: 07/13/2019 Discharge date: 07/18/2019  Admitted From:  HOME  Disposition: HOME   Recommendations for Outpatient Follow-up:  1. Follow up with PCP in 1 weeks 2. Follow up with cardiology as scheduled 3. Please obtain BMP/CBC in 1-2 weeks  Discharge Condition: STABLE   CODE STATUS: FULL    Brief Hospitalization Summary: Please see all hospital notes, images, labs for full details of the hospitalization. ADMISSION HPI: Ann [name] is a 74 y.o. female with medical history significant for CAD with prior LAD stent, remote history of atrial fibrillation, hypertension, insulin-dependent diabetes, adrenal insufficiency, obesity, COPD, remote history of DVT with prior use of Xarelto, and chronic diastolic heart failure with prior echo 07/2015 with LVEF 65-70%.  She was at her cardiologist's office today and complained of being short of breath for the past week and had some chest discomfort with deep inspiration.  She is also noted to have a nonproductive cough.  She has also had some swelling on both of her legs that has worsened over the last 1 week and she describes some symptoms of paroxysmal nocturnal dyspnea where she had to sit up and breathe.  She is also noted to have some palpitations.  An EKG was performed in the office demonstrating rapid atrial fibrillation at 144 bpm.  She was therefore, sent to the ED for further evaluation and management.   ED Course: Vital signs stable aside from elevated heart rate initially noted on EKG at 127 bpm.  She has been given 1 dose of IV Cardizem 10 mg with significant improvement in her heart rate noted.  She is noted to have a mild leukocytosis of 11,000 and hemoglobin is 10.4-which reflects her chronic anemia.  BNP is slightly elevated at 229 and initial troponin is 21.  She denies any chest pain  and states that overall she is feeling much better.  Her Covid testing has returned negative.  1 view chest x-ray with cardiomegaly and pulmonary vascular congestion noted.  Cardiology recommendations noted for anticoagulation on Eliquis as well as diuresis.  She is currently still in atrial fibrillation but heart rates are better controlled in the 90-100 bpm range.  Brief Admission Hx: 73 y.o.femalewith medical history significant forCAD with prior LAD stent, remote history of atrial fibrillation, hypertension, insulin-dependent diabetes, adrenal insufficiency, obesity, COPD, remote history of DVT with prior use of Xarelto, and chronic diastolic heart failure with prior echo 07/2015 with LVEF 65-70%. She was at her cardiologist's office today and complained of being short of breath for the past week and had some chest discomfort with deep inspiration.  She was found to be in Afib with RVR.    MDM/Assessment & Plan:   1. Atrial fibrillation with RVR-the patient responded well to IV Cardizem infusion and now transitioned over to oral Cardizem.  She has had some rebound tachycardia with activities and cardizem dose increased to cardizem CD 240 mg.  HR is much better controlled on this regimen.   The patient is on apixaban 5 mg twice daily for full anticoagulation.  2D echo below.  Appreciate assistance of the heart care team. 2. Acute on chronic diastolic heart failure-the patient is responding well and feeling better with IV Lasix.  She is diuresing well and we are following the 2D echocardiogram below.  EF 55-60%.  Cardiology team transitioned to oral lasix 07/16/19.  Pt  tolerating well.  3. CAD status post stent placement-stable. 4. Dyslipidemia-continue atorvastatin as ordered. 5. Insulin requiring type 2 diabetes mellitus with vascular complications-he has been resumed on home basal insulin.  Resume home Metformin.  Continue to monitor CBGs and provide sliding scale coverage if  needed. 6. Hypothyroidism-stable on levothyroxine continue. 7. Chronic adrenal insufficiency-she has been resumed on home prednisone.  Blood pressures have been stable.  Continue to follow closely. 8. Essential hypertension-stable follow. 9. Chronic urinary incontinence-stable. 10. GERD-continue PPI therapy. 11. Constipation - added miralax, suppository  DVT prophylaxis: apixaban Code Status: Full  Family Communication: patient  Disposition Plan:  Home today with very close outpatient follow up.    Consultants:  Cardiology   Procedures:  2D Echocardiogram 07/14/19 IMPRESSIONS  1. Left ventricular ejection fraction, by estimation, is 55 to 60%. The left ventricle has normal function. The left ventricle has no regional  wall motion abnormalities. There is moderate concentric left ventricular hypertrophy. Left ventricular diastolic parameters are indeterminate. Elevated left ventricular end-diastolic pressure.  2. Right ventricular systolic function is normal. The right ventricular size is normal.  3. The mitral valve is grossly normal. No evidence of mitral valve regurgitation.  4. The aortic valve is tricuspid. Aortic valve regurgitation is not visualized. Mild aortic valve sclerosis is present, with no evidence of aortic valve stenosis.  5. The inferior vena cava is normal in size with greater than 50% respiratory variability, suggesting right atrial pressure of 3 mmHg.    Discharge Diagnoses:  Active Problems:   Atrial fibrillation with rapid ventricular response (HCC)   Coronary artery disease of native artery of native heart with stable angina pectoris (HCC)   Chronic obstructive pulmonary disease ([employer])   History of DVT (deep vein thrombosis)  Discharge Instructions:  Allergies as of 07/18/2019      Reactions   Ace Inhibitors Other (See Comments)   unknown   Asa [aspirin]    Causes bleeding   Tape Other (See Comments)   Skin tearing, causes scars   Niacin Rash    Reglan [metoclopramide] Anxiety      Medication List    STOP taking these medications   amLODipine 5 MG tablet Commonly known as: NORVASC   FreeStyle Libre 14 Day Sensor Misc     TAKE these medications   albuterol 108 (90 Base) MCG/ACT inhaler Commonly known as: VENTOLIN HFA Inhale 2 puffs into the lungs every 4 (four) hours as needed for wheezing.   ALPRAZolam 0.5 MG tablet Commonly known as: XANAX Take 0.5 mg by mouth 2 (two) times daily as needed for anxiety or sleep.   apixaban 5 MG Tabs tablet Commonly known as: ELIQUIS Take 1 tablet (5 mg total) by mouth 2 (two) times daily.   atorvastatin 80 MG tablet Commonly known as: LIPITOR Take 1 tablet (80 mg total) by mouth daily at 6 PM.   cyanocobalamin 1000 MCG tablet Take 1 tablet (1,000 mcg total) by mouth daily.   diltiazem 240 MG 24 hr capsule Commonly known as: CARDIZEM CD Take 1 capsule (240 mg total) by mouth daily. Start taking on: July 19, 2019   Docusate Sodium 100 MG capsule Take 100 mg by mouth daily.   furosemide 40 MG tablet Commonly known as: LASIX Take 1 tablet (40 mg total) by mouth daily. Start taking on: July 19, 2019   gabapentin 400 MG capsule Commonly known as: NEURONTIN Take 400 mg by mouth 3 (three) times daily.   hydrALAZINE 50 MG tablet Commonly known as:  APRESOLINE Take 1 tablet (50 mg total) by mouth 3 (three) times daily. What changed: when to take this   insulin aspart 100 UNIT/ML injection Commonly known as: novoLOG Inject 8 Units into the skin as needed for high blood sugar. Take if blood sugar over 200.   Insulin Pen Needle 31G X 8 MM Misc Commonly known as: B-D ULTRAFINE III SHORT PEN Use  1 daily at bedtime to reject insulin.   Lancets Misc Used 2 daily   Levemir FlexTouch 100 UNIT/ML Pen Generic drug: Insulin Detemir Inject 60 Units into the skin at bedtime.   levothyroxine 175 MCG tablet Commonly known as: SYNTHROID Take 1 tablet (175 mcg total) by  mouth daily before breakfast.   metFORMIN 500 MG tablet Commonly known as: GLUCOPHAGE Take 1 tablet (500 mg total) by mouth 2 (two) times daily with a meal.   Myrbetriq 50 MG Tb24 tablet Generic drug: mirabegron ER Take 50 mg by mouth daily.   nitroGLYCERIN 0.4 MG SL tablet Commonly known as: NITROSTAT Place 1 tablet (0.4 mg total) under the tongue every 5 (five) minutes as needed for chest pain.   pantoprazole 40 MG tablet Commonly known as: Protonix 1 PO 30 MINUTES PRIOR TO MEALS BID What changed:   how much to take  how to take this  when to take this  additional instructions   polyethylene glycol 17 g packet Commonly known as: MIRALAX / GLYCOLAX Take 17 g by mouth 2 (two) times daily.   predniSONE 10 MG tablet Commonly known as: DELTASONE Take 1 tablet (10 mg total) by mouth daily with breakfast.   Refresh Plus 0.5 % Soln Generic drug: Carboxymethylcellulose Sod PF Place 2 drops into both eyes daily as needed (for dry eye relief).   sertraline 100 MG tablet Commonly known as: ZOLOFT Take 0.5 tablets (50 mg total) by mouth at bedtime. What changed:   how much to take  additional instructions   solifenacin 10 MG tablet Commonly known as: VESICARE Take 10 mg by mouth at bedtime.   SPIRIVA HANDIHALER IN Inhale into the lungs daily. Takes 2 puffs daily   SUMAtriptan 100 MG tablet Commonly known as: IMITREX SMARTSIG:1 Tablet(s) By Mouth As Needed   Vitamin D3 125 MCG (5000 UT) Caps Take 1 capsule (5,000 Units total) by mouth daily.      Follow-up Information    Erma Heritage, PA-C Follow up on 08/05/2019.   Specialties: Physician Assistant, Cardiology Why: Cardiology Hospital Follow-up on 08/05/2019 at 2:30PM.  Contact information: [address] [place] [address] [phone]        Maryruth Hancock, MD. Schedule an appointment as soon as possible for a visit in 1 week(s).   Specialty: Family Medicine Why: Hospital Follow Up  Contact  information: [place] [address] [phone]          Allergies  Allergen Reactions  . Ace Inhibitors Other (See Comments)    unknown  . Asa [Aspirin]     Causes bleeding  . Tape Other (See Comments)    Skin tearing, causes scars  . Niacin Rash  . Reglan [Metoclopramide] Anxiety   Allergies as of 07/18/2019      Reactions   Ace Inhibitors Other (See Comments)   unknown   Asa [aspirin]    Causes bleeding   Tape Other (See Comments)   Skin tearing, causes scars   Niacin Rash   Reglan [metoclopramide] Anxiety      Medication List  STOP taking these medications   amLODipine 5 MG tablet Commonly known as: NORVASC   FreeStyle Libre 14 Day Sensor Misc     TAKE these medications   albuterol 108 (90 Base) MCG/ACT inhaler Commonly known as: VENTOLIN HFA Inhale 2 puffs into the lungs every 4 (four) hours as needed for wheezing.   ALPRAZolam 0.5 MG tablet Commonly known as: XANAX Take 0.5 mg by mouth 2 (two) times daily as needed for anxiety or sleep.   apixaban 5 MG Tabs tablet Commonly known as: ELIQUIS Take 1 tablet (5 mg total) by mouth 2 (two) times daily.   atorvastatin 80 MG tablet Commonly known as: LIPITOR Take 1 tablet (80 mg total) by mouth daily at 6 PM.   cyanocobalamin 1000 MCG tablet Take 1 tablet (1,000 mcg total) by mouth daily.   diltiazem 240 MG 24 hr capsule Commonly known as: CARDIZEM CD Take 1 capsule (240 mg total) by mouth daily. Start taking on: July 19, 2019   Docusate Sodium 100 MG capsule Take 100 mg by mouth daily.   furosemide 40 MG tablet Commonly known as: LASIX Take 1 tablet (40 mg total) by mouth daily. Start taking on: July 19, 2019   gabapentin 400 MG capsule Commonly known as: NEURONTIN Take 400 mg by mouth 3 (three) times daily.   hydrALAZINE 50 MG tablet Commonly known as: APRESOLINE Take 1 tablet (50 mg total) by mouth 3 (three) times daily. What changed: when to take this    insulin aspart 100 UNIT/ML injection Commonly known as: novoLOG Inject 8 Units into the skin as needed for high blood sugar. Take if blood sugar over 200.   Insulin Pen Needle 31G X 8 MM Misc Commonly known as: B-D ULTRAFINE III SHORT PEN Use  1 daily at bedtime to reject insulin.   Lancets Misc Used 2 daily   Levemir FlexTouch 100 UNIT/ML Pen Generic drug: Insulin Detemir Inject 60 Units into the skin at bedtime.   levothyroxine 175 MCG tablet Commonly known as: SYNTHROID Take 1 tablet (175 mcg total) by mouth daily before breakfast.   metFORMIN 500 MG tablet Commonly known as: GLUCOPHAGE Take 1 tablet (500 mg total) by mouth 2 (two) times daily with a meal.   Myrbetriq 50 MG Tb24 tablet Generic drug: mirabegron ER Take 50 mg by mouth daily.   nitroGLYCERIN 0.4 MG SL tablet Commonly known as: NITROSTAT Place 1 tablet (0.4 mg total) under the tongue every 5 (five) minutes as needed for chest pain.   pantoprazole 40 MG tablet Commonly known as: Protonix 1 PO 30 MINUTES PRIOR TO MEALS BID What changed:   how much to take  how to take this  when to take this  additional instructions   polyethylene glycol 17 g packet Commonly known as: MIRALAX / GLYCOLAX Take 17 g by mouth 2 (two) times daily.   predniSONE 10 MG tablet Commonly known as: DELTASONE Take 1 tablet (10 mg total) by mouth daily with breakfast.   Refresh Plus 0.5 % Soln Generic drug: Carboxymethylcellulose Sod PF Place 2 drops into both eyes daily as needed (for dry eye relief).   sertraline 100 MG tablet Commonly known as: ZOLOFT Take 0.5 tablets (50 mg total) by mouth at bedtime. What changed:   how much to take  additional instructions   solifenacin 10 MG tablet Commonly known as: VESICARE Take 10 mg by mouth at bedtime.   SPIRIVA HANDIHALER IN Inhale into the lungs daily. Takes 2 puffs daily  SUMAtriptan 100 MG tablet Commonly known as: IMITREX SMARTSIG:1 Tablet(s) By Mouth As  Needed   Vitamin D3 125 MCG (5000 UT) Caps Take 1 capsule (5,000 Units total) by mouth daily.       Procedures/Studies: US Venous Img Lower Bilateral (DVT)  Result Date: 07/16/2019 CLINICAL DATA:  History of previous lower extremity DVT (left popliteal vein). Evaluate for acute or chronic DVT. EXAM: BILATERAL LOWER EXTREMITY VENOUS DOPPLER ULTRASOUND TECHNIQUE: Gray-scale sonography with graded compression, as well as color Doppler and duplex ultrasound were performed to evaluate the lower extremity deep venous systems from the level of the common femoral vein and including the common femoral, femoral, profunda femoral, popliteal and calf veins including the posterior tibial, peroneal and gastrocnemius veins when visible. The superficial great saphenous vein was also interrogated. Spectral Doppler was utilized to evaluate flow at rest and with distal augmentation maneuvers in the common femoral, femoral and popliteal veins. COMPARISON:  Left lower extremity venous Doppler ultrasound - 04/01/2012 FINDINGS: RIGHT LOWER EXTREMITY Common Femoral Vein: No evidence of thrombus. Normal compressibility, respiratory phasicity and response to augmentation. Saphenofemoral Junction: No evidence of thrombus. Normal compressibility and flow on color Doppler imaging. Profunda Femoral Vein: No evidence of thrombus. Normal compressibility and flow on color Doppler imaging. Femoral Vein: No evidence of thrombus. Normal compressibility, respiratory phasicity and response to augmentation. Popliteal Vein: No evidence of thrombus. Normal compressibility, respiratory phasicity and response to augmentation. Calf Veins: No evidence of thrombus. Normal compressibility and flow on color Doppler imaging. Superficial Great Saphenous Vein: No evidence of thrombus. Normal compressibility. Venous Reflux:  None. Other Findings:  None. LEFT LOWER EXTREMITY Common Femoral Vein: No evidence of thrombus. Normal compressibility, respiratory  phasicity and response to augmentation. Saphenofemoral Junction: No evidence of thrombus. Normal compressibility and flow on color Doppler imaging. Profunda Femoral Vein: No evidence of thrombus. Normal compressibility and flow on color Doppler imaging. Femoral Vein: No evidence of thrombus. Normal compressibility, respiratory phasicity and response to augmentation. Popliteal Vein: No evidence of acute or chronic thrombus. Normal compressibility, respiratory phasicity and response to augmentation. Calf Veins: No evidence of thrombus. Normal compressibility and flow on color Doppler imaging. Superficial Great Saphenous Vein: No evidence of thrombus. Normal compressibility. Venous Reflux:  None. Other Findings:  None. IMPRESSION: No evidence of acute or chronic DVT within either lower extremity with special attention paid to the left popliteal vein. Electronically Signed   By: Sandi Mariscal M.D.   On: 07/16/2019 12:47   DG Chest Port 1 View  Result Date: 07/13/2019 CLINICAL DATA:  Shortness of breath EXAM: PORTABLE CHEST 1 VIEW COMPARISON:  04/09/2018 FINDINGS: Interval removal of left-sided chest port. Heart size remains mildly enlarged. Aorta is calcified. Mild pulmonary vascular congestion and minimal interstitial prominence. Linear scarring versus atelectasis in the mid left lung. No pleural effusion or pneumothorax. IMPRESSION: Mild cardiomegaly with pulmonary vascular congestion and minimal interstitial prominence which may reflect mild interstitial edema. Electronically Signed   By: Davina Poke D.O.   On: 07/13/2019 13:15   ECHOCARDIOGRAM COMPLETE  Result Date: 07/14/2019    ECHOCARDIOGRAM REPORT   Patient Name:   SHANIQUIA DOUGAL Date of Exam: 07/14/2019 Medical Rec #:  [id number]        Height:       63.0 in Accession #:    [id number]       Weight:       271.6 lb Date of Birth:  11-12-1945         BSA:  2.20 m Patient Age:    74 years         BP:           111/99 mmHg Patient Gender: F                 HR:           103 bpm. Exam Location:  [facility] Procedure: 2D Echo Indications:    Atrial Fibrillation 427.31 / I48.91  History:        Patient has prior history of Echocardiogram examinations, most                 recent 08/14/2015. CAD, COPD, Arrythmias:Atrial Fibrillation;                 Risk Factors:Dyslipidemia, Hypertension, Diabetes and Former                 Smoker.  Sonographer:    Leavy Cella RDCS (AE) Referring Phys: [phone] Royanne Foots [place]  1. Left ventricular ejection fraction, by estimation, is 55 to 60%. The left ventricle has normal function. The left ventricle has no regional wall motion abnormalities. There is moderate concentric left ventricular hypertrophy. Left ventricular diastolic parameters are indeterminate. Elevated left ventricular end-diastolic pressure.  2. Right ventricular systolic function is normal. The right ventricular size is normal.  3. The mitral valve is grossly normal. No evidence of mitral valve regurgitation.  4. The aortic valve is tricuspid. Aortic valve regurgitation is not visualized. Mild aortic valve sclerosis is present, with no evidence of aortic valve stenosis.  5. The inferior vena cava is normal in size with greater than 50% respiratory variability, suggesting right atrial pressure of 3 mmHg. FINDINGS  Left Ventricle: Left ventricular ejection fraction, by estimation, is 55 to 60%. The left ventricle has normal function. The left ventricle has no regional wall motion abnormalities. The left ventricular internal cavity size was normal in size. There is  moderate concentric left ventricular hypertrophy. Left ventricular diastolic parameters are indeterminate. Elevated left ventricular end-diastolic pressure. Right Ventricle: The right ventricular size is normal. No increase in right ventricular wall thickness. Right ventricular systolic function is normal. Left Atrium: Left atrial size was normal in size. Right Atrium: Right atrial size was  normal in size. Pericardium: There is no evidence of pericardial effusion. Mitral Valve: The mitral valve is grossly normal. No evidence of mitral valve regurgitation. Tricuspid Valve: The tricuspid valve is grossly normal. Tricuspid valve regurgitation is trivial. Aortic Valve: The aortic valve is tricuspid. . There is mild thickening of the aortic valve. Aortic valve regurgitation is not visualized. Mild aortic valve sclerosis is present, with no evidence of aortic valve stenosis. Mild aortic valve annular calcification. There is mild thickening of the aortic valve. Pulmonic Valve: The pulmonic valve was grossly normal. Pulmonic valve regurgitation is not visualized. Aorta: The aortic root is normal in size and structure. Venous: The inferior vena cava is normal in size with greater than 50% respiratory variability, suggesting right atrial pressure of 3 mmHg. IAS/Shunts: No atrial level shunt detected by color flow Doppler.  LEFT VENTRICLE PLAX 2D LVIDd:         5.00 cm  Diastology LVIDs:         4.11 cm  LV e' lateral:   6.85 cm/s LV PW:         1.60 cm  LV E/e' lateral: 14.7 LV IVS:        1.37 cm  LV e'  medial:    4.90 cm/s LVOT diam:     2.10 cm  LV E/e' medial:  20.6 LV SV Index:   18.05 LVOT Area:     3.46 cm  RIGHT VENTRICLE RV S prime:     11.50 cm/s TAPSE (M-mode): 1.8 cm LEFT ATRIUM             Index       RIGHT ATRIUM           Index LA diam:        3.40 cm 1.54 cm/m  RA Area:     15.50 cm LA Vol (A2C):   53.9 ml 24.48 ml/m RA Volume:   35.10 ml  15.94 ml/m LA Vol (A4C):   76.4 ml 34.69 ml/m LA Biplane Vol: 65.7 ml 29.83 ml/m   AORTA Ao Root diam: 3.40 cm MITRAL VALVE MV Area (PHT): 4.39 cm     SHUNTS MV Decel Time: 173 msec     Systemic Diam: 2.10 cm MV E velocity: 101.00 cm/s MV A velocity: 35.40 cm/s MV E/A ratio:  2.85 Kate Sable MD Electronically signed by Kate Sable MD Signature Date/Time: 07/14/2019/4:37:32 PM    Final       Subjective: Pt reports feeling much better.   No palpitations.  No chest pain and no SOB.    Discharge Exam: Vitals:   07/18/19 1000 07/18/19 1100  BP: 127/75 (!) 143/81  Pulse: 80 77  Resp: (!) 24 (!) 25  Temp:    SpO2: 93% 93%   Vitals:   07/18/19 0900 07/18/19 0941 07/18/19 1000 07/18/19 1100  BP: 137/75  127/75 (!) 143/81  Pulse: (!) 105  80 77  Resp: 17  (!) 24 (!) 25  Temp:      TempSrc:      SpO2: 94% 93% 93% 93%  Weight:      Height:       General: Pt is alert, awake, not in acute distress Cardiovascular: irregularly irregular, S1/S2 +, no rubs, no gallops Respiratory: CTA bilaterally, no wheezing, no rhonchi Abdominal: Soft, NT, ND, bowel sounds + Extremities: trace pretibial edema bilateral LEs, no cyanosis   The results of significant diagnostics from this hospitalization (including imaging, microbiology, ancillary and laboratory) are listed below for reference.     Microbiology: Recent Results (from the past 240 hour(s))  Respiratory Panel by RT PCR (Flu A&B, Covid) - Nasopharyngeal Swab     Status: None   Collection Time: 07/13/19 12:43 PM   Specimen: Nasopharyngeal Swab  Result Value Ref Range Status   SARS Coronavirus 2 by RT PCR NEGATIVE NEGATIVE Final    Comment: (NOTE) SARS-CoV-2 target nucleic acids are NOT DETECTED. The SARS-CoV-2 RNA is generally detectable in upper respiratoy specimens during the acute phase of infection. The lowest concentration of SARS-CoV-2 viral copies this assay can detect is 131 copies/mL. A negative result does not preclude SARS-Cov-2 infection and should not be used as the sole basis for treatment or other patient management decisions. A negative result may occur with  improper specimen collection/handling, submission of specimen other than nasopharyngeal swab, presence of viral mutation(s) within the areas targeted by this assay, and inadequate number of viral copies (<131 copies/mL). A negative result must be combined with clinical observations, patient history,  and epidemiological information. The expected result is Negative. Fact Sheet for Patients:  PinkCheek.be Fact Sheet for Healthcare Providers:  GravelBags.it This test is not yet ap proved or cleared by the [place] and  has been authorized for detection and/or diagnosis of SARS-CoV-2 by FDA under an Emergency Use Authorization (EUA). This EUA will remain  in effect (meaning this test can be used) for the duration of the COVID-19 declaration under Section 564(b)(1) of the Act, 21 U.S.C. section 360bbb-3(b)(1), unless the authorization is terminated or revoked sooner.    Influenza A by PCR NEGATIVE NEGATIVE Final   Influenza B by PCR NEGATIVE NEGATIVE Final    Comment: (NOTE) The Xpert Xpress SARS-CoV-2/FLU/RSV assay is intended as an aid in  the diagnosis of influenza from Nasopharyngeal swab specimens and  should not be used as a sole basis for treatment. Nasal washings and  aspirates are unacceptable for Xpert Xpress SARS-CoV-2/FLU/RSV  testing. Fact Sheet for Patients: PinkCheek.be Fact Sheet for Healthcare Providers: GravelBags.it This test is not yet approved or cleared by the [place] FDA and  has been authorized for detection and/or diagnosis of SARS-CoV-2 by  FDA under an Emergency Use Authorization (EUA). This EUA will remain  in effect (meaning this test can be used) for the duration of the  Covid-19 declaration under Section 564(b)(1) of the Act, 21  U.S.C. section 360bbb-3(b)(1), unless the authorization is  terminated or revoked. Performed at [facility], [address] [place], [place] [address]   MRSA PCR Screening     Status: None   Collection Time: 07/13/19  7:05 PM   Specimen: Nasal Mucosa; Nasopharyngeal  Result Value Ref Range Status   MRSA by PCR NEGATIVE NEGATIVE Final    Comment:        The GeneXpert MRSA Assay (FDA approved  for NASAL specimens only), is one component of a comprehensive MRSA colonization surveillance program. It is not intended to diagnose MRSA infection nor to guide or monitor treatment for MRSA infections. Performed at [facility], [address], [place], [place] [address]     Labs: BNP (last 3 results) Recent Labs    07/13/19 1257  BNP [id number]*   Basic Metabolic Panel: Recent Labs  Lab 07/14/19 0409 07/15/19 0400 07/16/19 0409 07/17/19 0514 07/18/19 0525  NA 140 142 139 139 138  K 4.1 4.5 4.1 3.8 4.0  CL 108 101 100 103 102  CO2 23 27 27 26 27   GLUCOSE 148* 126* 143* 157* 155*  BUN 15 19 25* 27* 32*  CREATININE 0.69 0.93 0.86 0.83 0.94  CALCIUM 8.6* 8.8* 9.1 9.2 9.1  MG 1.8 2.2 2.0 1.9  --    Liver Function Tests: Recent Labs  Lab 07/13/19 1257  AST 33  ALT 24  ALKPHOS 89  BILITOT 0.3  PROT 6.9  ALBUMIN 3.6   No results for input(s): LIPASE, AMYLASE in the last 168 hours. No results for input(s): AMMONIA in the last 168 hours. CBC: Recent Labs  Lab 07/13/19 1257 07/14/19 0409  WBC 11.0* 9.3  HGB 10.4* 10.2*  HCT 35.8* 36.1  MCV 82.5 83.0  PLT 256 241   Cardiac Enzymes: No results for input(s): CKTOTAL, CKMB, CKMBINDEX, TROPONINI in the last 168 hours. BNP: Invalid input(s): POCBNP CBG: Recent Labs  Lab 07/17/19 0800 07/17/19 1125 07/17/19 1632 07/17/19 2259 07/18/19 0810  GLUCAP 154* 204* 174* 149* 159*   D-Dimer No results for input(s): DDIMER in the last 72 hours. Hgb A1c No results for input(s): HGBA1C in the last 72 hours. Lipid Profile No results for input(s): CHOL, HDL, LDLCALC, TRIG, CHOLHDL, LDLDIRECT in the last 72 hours. Thyroid function studies No results for input(s): TSH, T4TOTAL, T3FREE, THYROIDAB in the  last 72 hours.  Invalid input(s): FREET3 Anemia work up No results for input(s): VITAMINB12, FOLATE, FERRITIN, TIBC, IRON, RETICCTPCT in the last 72 hours. Urinalysis    Component Value Date/Time   COLORURINE YELLOW  06/17/2017 0329   APPEARANCEUR CLEAR 06/17/2017 0329   LABSPEC >1.046 (H) 06/17/2017 0329   PHURINE 5.0 06/17/2017 0329   GLUCOSEU NEGATIVE 06/17/2017 0329   HGBUR NEGATIVE 06/17/2017 0329   BILIRUBINUR NEGATIVE 06/17/2017 0329   KETONESUR NEGATIVE 06/17/2017 0329   PROTEINUR NEGATIVE 06/17/2017 0329   UROBILINOGEN 0.2 03/15/2013 1504   NITRITE NEGATIVE 06/17/2017 0329   LEUKOCYTESUR NEGATIVE 06/17/2017 0329   Sepsis Labs Invalid input(s): PROCALCITONIN,  WBC,  LACTICIDVEN Microbiology Recent Results (from the past 240 hour(s))  Respiratory Panel by RT PCR (Flu A&B, Covid) - Nasopharyngeal Swab     Status: None   Collection Time: 07/13/19 12:43 PM   Specimen: Nasopharyngeal Swab  Result Value Ref Range Status   SARS Coronavirus 2 by RT PCR NEGATIVE NEGATIVE Final    Comment: (NOTE) SARS-CoV-2 target nucleic acids are NOT DETECTED. The SARS-CoV-2 RNA is generally detectable in upper respiratoy specimens during the acute phase of infection. The lowest concentration of SARS-CoV-2 viral copies this assay can detect is 131 copies/mL. A negative result does not preclude SARS-Cov-2 infection and should not be used as the sole basis for treatment or other patient management decisions. A negative result may occur with  improper specimen collection/handling, submission of specimen other than nasopharyngeal swab, presence of viral mutation(s) within the areas targeted by this assay, and inadequate number of viral copies (<131 copies/mL). A negative result must be combined with clinical observations, patient history, and epidemiological information. The expected result is Negative. Fact Sheet for Patients:  PinkCheek.be Fact Sheet for Healthcare Providers:  GravelBags.it This test is not yet ap proved or cleared by the [place] FDA and  has been authorized for detection and/or diagnosis of SARS-CoV-2 by FDA under an Emergency  Use Authorization (EUA). This EUA will remain  in effect (meaning this test can be used) for the duration of the COVID-19 declaration under Section 564(b)(1) of the Act, 21 U.S.C. section 360bbb-3(b)(1), unless the authorization is terminated or revoked sooner.    Influenza A by PCR NEGATIVE NEGATIVE Final   Influenza B by PCR NEGATIVE NEGATIVE Final    Comment: (NOTE) The Xpert Xpress SARS-CoV-2/FLU/RSV assay is intended as an aid in  the diagnosis of influenza from Nasopharyngeal swab specimens and  should not be used as a sole basis for treatment. Nasal washings and  aspirates are unacceptable for Xpert Xpress SARS-CoV-2/FLU/RSV  testing. Fact Sheet for Patients: PinkCheek.be Fact Sheet for Healthcare Providers: GravelBags.it This test is not yet approved or cleared by the [place] FDA and  has been authorized for detection and/or diagnosis of SARS-CoV-2 by  FDA under an Emergency Use Authorization (EUA). This EUA will remain  in effect (meaning this test can be used) for the duration of the  Covid-19 declaration under Section 564(b)(1) of the Act, 21  U.S.C. section 360bbb-3(b)(1), unless the authorization is  terminated or revoked. Performed at [facility], [address], [place], [place] [address]   MRSA PCR Screening     Status: None   Collection Time: 07/13/19  7:05 PM   Specimen: Nasal Mucosa; Nasopharyngeal  Result Value Ref Range Status   MRSA by PCR NEGATIVE NEGATIVE Final    Comment:        The GeneXpert MRSA Assay (FDA approved for NASAL  specimens only), is one component of a comprehensive MRSA colonization surveillance program. It is not intended to diagnose MRSA infection nor to guide or monitor treatment for MRSA infections. Performed at [facility], [address], [place], [place] [address]    Time coordinating discharge: 38 mins  SIGNED:  Irwin Brakeman, MD  [facility] 07/18/2019, 11:55 AM How to contact the [facility] Attending or Consulting provider [place] or covering provider during after hours [place], for this patient?  1. Check the care team in [facility] and look for a) attending/consulting TR[facility] provider listed and b) the [facility] team listed 2. Log into [email or web address] and use [place]'s universal password to access. If you do not have the password, please contact the hospital operator. 3. Locate the [facility] provider you are looking for under [facility] and page to a number that you can be directly reached. 4. If you still have difficulty reaching the provider, please page the [facility] (Director on Call) for the Hospitalists listed on amion for assistance.

## 2019-07-18 NOTE — Discharge Instructions (Signed)
Atrial Fibrillation  Atrial fibrillation is a type of heartbeat that is irregular or fast. If you have this condition, your heart beats without any order. This makes it hard for your heart to pump blood in a normal way. Atrial fibrillation may come and go, or it may become a long-lasting problem. If this condition is not treated, it can put you at higher risk for stroke, heart failure, and other heart problems. What are the causes? This condition may be caused by diseases that damage the heart. They include:  High blood pressure.  Heart failure.  Heart valve disease.  Heart surgery. Other causes include:  Diabetes.  Thyroid disease.  Being overweight.  Kidney disease. Sometimes the cause is not known. What increases the risk? You are more likely to develop this condition if:  You are older.  You smoke.  You exercise often and very hard.  You have a family history of this condition.  You are a man.  You use drugs.  You drink a lot of alcohol.  You have lung conditions, such as emphysema, pneumonia, or COPD.  You have sleep apnea. What are the signs or symptoms? Common symptoms of this condition include:  A feeling that your heart is beating very fast.  Chest pain or discomfort.  Feeling short of breath.  Suddenly feeling light-headed or weak.  Getting tired easily during activity.  Fainting.  Sweating. In some cases, there are no symptoms. How is this treated? Treatment for this condition depends on underlying conditions and how you feel when you have atrial fibrillation. They include:  Medicines to: ? Prevent blood clots. ? Treat heart rate or heart rhythm problems.  Using devices, such as a pacemaker, to correct heart rhythm problems.  Doing surgery to remove the part of the heart that sends bad signals.  Closing an area where clots can form in the heart (left atrial appendage). In some cases, your doctor will treat other underlying  conditions. Follow these instructions at home: Medicines  Take over-the-counter and prescription medicines only as told by your doctor.  Do not take any new medicines without first talking to your doctor.  If you are taking blood thinners: ? Talk with your doctor before you take any medicines that have aspirin or NSAIDs, such as ibuprofen, in them. ? Take your medicine exactly as told by your doctor. Take it at the same time each day. ? Avoid activities that could hurt or bruise you. Follow instructions about how to prevent falls. ? Wear a bracelet that says you are taking blood thinners. Or, carry a card that lists what medicines you take. Lifestyle      Do not use any products that have nicotine or tobacco in them. These include cigarettes, e-cigarettes, and chewing tobacco. If you need help quitting, ask your doctor.  Eat heart-healthy foods. Talk with your doctor about the right eating plan for you.  Exercise regularly as told by your doctor.  Do not drink alcohol.  Lose weight if you are overweight.  Do not use drugs, including cannabis. General instructions  If you have a condition that causes breathing to stop for a short period of time (apnea), treat it as told by your doctor.  Keep a healthy weight. Do not use diet pills unless your doctor says they are safe for you. Diet pills may make heart problems worse.  Keep all follow-up visits as told by your doctor. This is important. Contact a doctor if:  You notice a change   in the speed, rhythm, or strength of your heartbeat.  You are taking a blood-thinning medicine and you get more bruising.  You get tired more easily when you move or exercise.  You have a sudden change in weight. Get help right away if:   You have pain in your chest or your belly (abdomen).  You have trouble breathing.  You have side effects of blood thinners, such as blood in your vomit, poop (stool), or pee (urine), or bleeding that cannot  stop.  You have any signs of a stroke. "BE FAST" is an easy way to remember the main warning signs: ? B - Balance. Signs are dizziness, sudden trouble walking, or loss of balance. ? E - Eyes. Signs are trouble seeing or a change in how you see. ? F - Face. Signs are sudden weakness or loss of feeling in the face, or the face or eyelid drooping on one side. ? A - Arms. Signs are weakness or loss of feeling in an arm. This happens suddenly and usually on one side of the body. ? S - Speech. Signs are sudden trouble speaking, slurred speech, or trouble understanding what people say. ? T - Time. Time to call emergency services. Write down what time symptoms started.  You have other signs of a stroke, such as: ? A sudden, very bad headache with no known cause. ? Feeling like you may vomit (nausea). ? Vomiting. ? A seizure. These symptoms may be an emergency. Do not wait to see if the symptoms will go away. Get medical help right away. Call your local emergency services (911 in the U.S.). Do not drive yourself to the hospital. Summary  Atrial fibrillation is a type of heartbeat that is irregular or fast.  You are at higher risk of this condition if you smoke, are older, have diabetes, or are overweight.  Follow your doctor's instructions about medicines, diet, exercise, and follow-up visits.  Get help right away if you have signs or symptoms of a stroke.  Get help right away if you cannot catch your breath, or you have chest pain or discomfort. This information is not intended to replace advice given to you by your health care provider. Make sure you discuss any questions you have with your health care provider. Document Revised: 11/04/2018 Document Reviewed: 11/04/2018 Elsevier Patient Education  [place].   Preventing Atrial Fibrillation-Related Stroke  Atrial fibrillation is a common type of irregular or rapid heartbeat (arrhythmia) that greatly increases your risk for a  stroke. In atrial fibrillation, the top portions of the heart (atria) beat out of sync with the lower portions of the heart. When the muscles of the atria are tightening in an uncoordinated way (fibrillating), blood can pool in the heart and form clots. If a clot travels to the brain, it can cause a stroke. This type of stroke is preventable. Understanding atrial fibrillation and knowing how to properly manage it can prevent you from having a stroke. What increases my risk for a stroke? If you have atrial fibrillation, you may be at increased risk for a stroke if you also:  Have heart failure.  Have high blood pressure.  Are older than age 70.  Have diabetes.  Have a history of vascular disease, such as heart attack or stroke.  Are female. If you have atrial fibrillation and you also have one or more of those risk factors, talk with your health care provider about treatments that can prevent a stroke. Other  risk factors for a stroke include:  Smoking.  High cholesterol.  Diabetes.  Being inactive (sedentary lifestyle).  Having a family history of stroke.  Eating a diet that is high in fat, cholesterol, and salt. What treatments help to manage atrial fibrillation? The main goals of treatment for atrial fibrillation are to prevent blood clots from forming and to keep your heart beating at a normal rate and rhythm. Treatment may include:  Blood-thinning medicine (anticoagulant) that helps to prevent clots from forming. This medicine also increases the risk of bleeding. Talk with your health care provider about the risks and benefits of taking anticoagulants.  Medicine that slows the heart rate or brings the heart rhythm back to normal.  Electrical cardioversion. This is a procedure that resets the heart's rhythm by delivering a controlled, low-energy shock through your skin to your heart.  An ablation procedure, such as catheter ablation, catheter ablation with pacemaker, or  surgical ablation. These procedures destroy the heart tissues that send abnormal signals so that heart rhythms can be improved or made normal. A pacemaker is a device that is placed under the skin to help the heart beat in a regular rhythm. How can I prevent atrial fibrillation-related stroke? Medicines  Take over-the-counter and prescription medicines only as told by your health care provider.  If your health care provider prescribed an anticoagulant, take it exactly as told. Taking too much blood-thinning medicine can cause bleeding. If you do not take enough blood-thinning medicine, you will not have the protection that you need against stroke and other problems. Eating and drinking  Eat healthy foods, including at least 5 servings of fruits and vegetables a day.  Do not drink alcohol.  Do not drink beverages that contain caffeine, such as coffee, soda, and tea.  Follow dietary instructions as told by your health care provider. Managing other medical conditions  Manage and be aware of your blood pressure. If you have high blood pressure, follow your treatment plan to keep it in your target range.  Have your cholesterol checked as often as recommended by your health care provider. If you have high cholesterol, follow your treatment plan to lower it and keep it in your target range.  Talk with your health care provider about symptoms to watch for. Some people may not have any symptoms, so it can be hard to know that they have atrial fibrillation. Talk with your health care provider if you experience: ? A feeling that your heart is beating rapidly or irregularly. ? An irregular pulse. ? A feeling of discomfort or pain in your chest. ? Shortness of breath. ? Sudden light-headedness or weakness. ? Tiredness (fatigue) that happens easily during exercise.  If you have obstructive sleep apnea (OSA), manage your condition as told by your health care provider. General instructions  Maintain  a healthy weight. Do not use diet pills unless your health care provider approves. Diet pills may make heart problems worse.  Exercise regularly. Get at least 30 minutes of activity on most or all days, or as told by your health care provider.  Do not use any products that contain nicotine or tobacco, such as cigarettes and e-cigarettes. If you need help quitting, ask your health care provider.  Do not use drugs, such as cocaine and amphetamines.  Keep all follow-up visits as told by your health care providers. This is important. These include visits with your heart specialist. Where to find more information You may find more information about preventing atrial fibrillation-related  stroke from:  National Stroke Association (AFib-Stroke Connection): www.stroke.org Contact a health care provider if:  You notice a change in the rate, rhythm, or strength of your heartbeat.  You have dizziness.  You are taking an anticoagulant and you have more bruises than usual.  You tire out more easily when you exercise or do similar activities. Get help right away if:   You have chest pain.  You have pain in your abdomen.  You experience unusual sweating or weakness.  You take anticoagulants and you: ? Have severe headaches or confusion. ? Have blood in your vomit, bowel movement, or urine. ? Have bleeding that will not stop. ? Fall or injure your head.  You have any symptoms of a stroke. "BE FAST" is an easy way to remember the main warning signs of a stroke: ? B - Balance. Signs are dizziness, trouble walking, or loss of balance. ? E - Eyes. Signs are trouble seeing or a sudden change in vision. ? F - Face. Signs are sudden weakness or numbness of the face, or the face or eyelid drooping on one side. ? A - Arms. Signs are weakness or numbness in an arm. This happens suddenly and usually on one side of the body. ? S - Speech. Signs are sudden trouble speaking, slurred speech, or trouble  understanding what people say. ? T - Time. Time to call emergency services. Write down what time symptoms started.  You have other signs of a stroke, such as: ? A sudden, severe headache with no known cause. ? Nausea or vomiting. ? Seizure. These symptoms may represent a serious problem that is an emergency. Do not wait to see if the symptoms will go away. Get medical help right away. Call your local emergency services (911 in the [place].S[place] Do not drive yourself to the hospital. Summary  Having atrial fibrillation increases the risk for a stroke. Talk with your health care provider about what symptoms to watch for.  Atrial fibrillation-related stroke is preventable. Proper management of atrial fibrillation can prevent you from having a stroke.  Talk with your health care provider about whether anticoagulant medicine is right for you.  Learn the warning signs of a stroke and remember "BE FAST." This information is not intended to replace advice given to you by your health care provider. Make sure you discuss any questions you have with your health care provider. Document Revised: 09/07/2018 Document Reviewed: 08/28/2016 Elsevier Patient Education  [place].   IMPORTANT INFORMATION: PAY CLOSE ATTENTION   PHYSICIAN DISCHARGE INSTRUCTIONS  Follow with Primary care provider  [place], Rex Kras, MD  and other consultants as instructed by your Hospitalist Physician  [place] IF SYMPTOMS COME BACK, WORSEN OR NEW PROBLEM DEVELOPS   Please note: You were cared for by a hospitalist during your hospital stay. Every effort will be made to forward records to your primary care provider.  You can request that your primary care provider send for your hospital records if they have not received them.  Once you are discharged, your primary care physician will handle any further medical issues. Please note that NO REFILLS for any discharge medications will be  authorized once you are discharged, as it is imperative that you return to your primary care physician (or establish a relationship with a primary care physician if you do not have one) for your post hospital discharge needs so that they can reassess your need for medications and monitor  your lab values.  Please get a complete blood count and chemistry panel checked by your Primary MD at your next visit, and again as instructed by your Primary MD.  Get Medicines reviewed and adjusted: Please take all your medications with you for your next visit with your Primary MD  Laboratory/radiological data: Please request your Primary MD to go over all hospital tests and procedure/radiological results at the follow up, please ask your primary care provider to get all Hospital records sent to his/her office.  In some cases, they will be blood work, cultures and biopsy results pending at the time of your discharge. Please request that your primary care provider follow up on these results.  If you are diabetic, please bring your blood sugar readings with you to your follow up appointment with primary care.    Please call and make your follow up appointments as soon as possible.    Also Note the following: If you experience worsening of your admission symptoms, develop shortness of breath, life threatening emergency, suicidal or homicidal thoughts you must seek medical attention immediately by calling 911 or calling your MD immediately  if symptoms less severe.  You must read complete instructions/literature along with all the possible adverse reactions/side effects for all the Medicines you take and that have been prescribed to you. Take any new Medicines after you have completely understood and accpet all the possible adverse reactions/side effects.   Do not drive when taking Pain medications or sleeping medications (Benzodiazepines)  Do not take more than prescribed Pain, Sleep and Anxiety Medications. It  is not advisable to combine anxiety,sleep and pain medications without talking with your primary care practitioner  Special Instructions: If you have smoked or chewed Tobacco  in the last 2 yrs please stop smoking, stop any regular Alcohol  and or any Recreational drug use.  Wear Seat belts while driving.  Do not drive if taking any narcotic, mind altering or controlled substances or recreational drugs or alcohol.

## 2019-07-18 NOTE — Progress Notes (Signed)
Patient and husband given discharge instructions. Patient and husband verbalized understanding. Peripheral IV removed. Patient taken down via wheelchair to be discharged to home.

## 2019-07-19 ENCOUNTER — Telehealth: Payer: Self-pay | Admitting: Family Medicine

## 2019-07-19 NOTE — Telephone Encounter (Signed)
Patient called and states she just got out of the hospital and is needing a hospital follow up. Her new patient appointment is 08/31/19. How would you like this handled.

## 2019-07-19 NOTE — Telephone Encounter (Signed)
Likely will need to do hospital f/u in urgent care and keep appt for new patient in April

## 2019-07-19 NOTE — Telephone Encounter (Signed)
Informed patient

## 2019-07-22 ENCOUNTER — Encounter: Payer: Self-pay | Admitting: Family Medicine

## 2019-08-05 ENCOUNTER — Other Ambulatory Visit: Payer: Self-pay

## 2019-08-05 ENCOUNTER — Ambulatory Visit (INDEPENDENT_AMBULATORY_CARE_PROVIDER_SITE_OTHER): Payer: Medicare Other | Admitting: Student

## 2019-08-05 ENCOUNTER — Encounter: Payer: Self-pay | Admitting: Student

## 2019-08-05 VITALS — BP 110/70 | HR 64 | Temp 97.1°F | Ht 63.0 in | Wt 262.0 lb

## 2019-08-05 DIAGNOSIS — E785 Hyperlipidemia, unspecified: Secondary | ICD-10-CM

## 2019-08-05 DIAGNOSIS — I25119 Atherosclerotic heart disease of native coronary artery with unspecified angina pectoris: Secondary | ICD-10-CM

## 2019-08-05 DIAGNOSIS — I10 Essential (primary) hypertension: Secondary | ICD-10-CM | POA: Diagnosis not present

## 2019-08-05 DIAGNOSIS — I5032 Chronic diastolic (congestive) heart failure: Secondary | ICD-10-CM

## 2019-08-05 DIAGNOSIS — Z01818 Encounter for other preprocedural examination: Secondary | ICD-10-CM

## 2019-08-05 DIAGNOSIS — I4819 Other persistent atrial fibrillation: Secondary | ICD-10-CM | POA: Diagnosis not present

## 2019-08-05 MED ORDER — APIXABAN 5 MG PO TABS: 5.0000 mg | ORAL_TABLET | Freq: Two times a day (BID) | ORAL | 3 refills | Status: DC

## 2019-08-05 MED ORDER — DILTIAZEM HCL ER COATED BEADS 240 MG PO CP24: 240.0000 mg | ORAL_CAPSULE | Freq: Every day | ORAL | 3 refills | Status: DC

## 2019-08-05 MED ORDER — FUROSEMIDE 40 MG PO TABS: 40.0000 mg | ORAL_TABLET | Freq: Every day | ORAL | 3 refills | Status: DC
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
## 2019-08-05 NOTE — Patient Instructions (Signed)
Medication Instructions:  Your physician recommends that you continue on your current medications as directed. Please refer to the Current Medication list given to you today.  *If you need a refill on your cardiac medications before your next appointment, please call your pharmacy*   Lab Work: Your physician recommends that you return for lab work in: At pre- op Appointment   If you have labs (blood work) drawn today and your tests are completely normal, you will receive your results only by: Marland Kitchen MyChart Message (if you have MyChart) OR . A paper copy in the mail If you have any lab test that is abnormal or we need to change your treatment, we will call you to review the results.   Testing/Procedures: Your physician has recommended that you have a Cardioversion (DCCV). Electrical Cardioversion uses a jolt of electricity to your heart either through paddles or wired patches attached to your chest. This is a controlled, usually prescheduled, procedure. Defibrillation is done under light anesthesia in the hospital, and you usually go home the day of the procedure. This is done to get your heart back into a normal rhythm. You are not awake for the procedure. Please see the instruction sheet given to you today.     Follow-Up: At [facility], you and your health needs are our priority.  As part of our continuing mission to provide you with exceptional heart care, we have created designated Provider Care Teams.  These Care Teams include your primary Cardiologist (physician) and Advanced Practice Providers (APPs -  Physician Assistants and Nurse Practitioners) who all work together to provide you with the care you need, when you need it.  We recommend signing up for the patient portal called "MyChart".  Sign up information is provided on this After Visit Summary.  MyChart is used to connect with patients for Virtual Visits (Telemedicine).  Patients are able to view lab/test results, encounter notes,  upcoming appointments, etc.  Non-urgent messages can be sent to your provider as well.   To learn more about what you can do with MyChart, go to [email or web address].    Your next appointment:   3-4 week(s)  The format for your next appointment:   In Person  Provider:   You may see Kate Sable, MD or one of the following Advanced Practice Providers on your designated Care Team:    Bernerd Pho, PA-C   Ermalinda Barrios, PA-C     Other Instructions Thank you for choosing [facility]!
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
## 2019-08-06 NOTE — Patient Instructions (Signed)
Ann [name]  08/06/2019     @PREFPERIOPPHARMACY @   Your procedure is scheduled on  08/12/2019 .  Report to [name] at  0930  A.M.  Call this number if you have problems the morning of surgery:  [phone]   Remember:  Do not eat or drink after midnight.                      Take these medicines the morning of surgery with A SIP OF WATER  Xanax. Gabapentin, levothyroxine, imitrex(if needed), mirabegron, zoloft. Take 1/2 of your usual night time insulin the night before your procedure. DO NOT take any medications for diabetes the morning of your procedure.    Do not wear jewelry, make-up or nail polish.  Do not wear lotions, powders, or perfumes. Please wear deodorant and brush your teeth.  Do not shave 48 hours prior to surgery.  Men may shave face and neck.  Do not bring valuables to the hospital.  [facility] is not responsible for any belongings or valuables.  Contacts, dentures or bridgework may not be worn into surgery.  Leave your suitcase in the car.  After surgery it may be brought to your room.  For patients admitted to the hospital, discharge time will be determined by your treatment team.  Patients discharged the day of surgery will not be allowed to drive home.   Name and phone number of your driver:   family Special instructions:  DO NOT smoke the morning of your procedure.  Please read over the following fact sheets that you were given. Anesthesia Post-op Instructions and Care and Recovery After Surgery        Electrical Cardioversion Electrical cardioversion is the delivery of a jolt of electricity to restore a normal rhythm to the heart. A rhythm that is too fast or is not regular keeps the heart from pumping well. In this procedure, sticky patches or metal paddles are placed on the chest to deliver electricity to the heart from a device. This procedure may be done in an emergency if:  There is low or no blood pressure as a result of the  heart rhythm.  Normal rhythm must be restored as fast as possible to protect the brain and heart from further damage.  It may save a life. This may also be a scheduled procedure for irregular or fast heart rhythms that are not immediately life-threatening. Tell a health care provider about:  Any allergies you have.  All medicines you are taking, including vitamins, herbs, eye drops, creams, and over-the-counter medicines.  Any problems you or family members have had with anesthetic medicines.  Any blood disorders you have.  Any surgeries you have had.  Any medical conditions you have.  Whether you are pregnant or may be pregnant. What are the risks? Generally, this is a safe procedure. However, problems may occur, including:  Allergic reactions to medicines.  A blood clot that breaks free and travels to other parts of your body.  The possible return of an abnormal heart rhythm within hours or days after the procedure.  Your heart stopping (cardiac arrest). This is rare. What happens before the procedure? Medicines  Your health care provider may have you start taking: ? Blood-thinning medicines (anticoagulants) so your blood does not clot as easily. ? Medicines to help stabilize your heart rate and rhythm.  Ask your health care provider about: ? Changing or stopping your regular medicines. This  is especially important if you are taking diabetes medicines or blood thinners. ? Taking medicines such as aspirin and ibuprofen. These medicines can thin your blood. Do not take these medicines unless your health care provider tells you to take them. ? Taking over-the-counter medicines, vitamins, herbs, and supplements. General instructions  Follow instructions from your health care provider about eating or drinking restrictions.  Plan to have someone take you home from the hospital or clinic.  If you will be going home right after the procedure, plan to have someone with you  for 24 hours.  Ask your health care provider what steps will be taken to help prevent infection. These may include washing your skin with a germ-killing soap. What happens during the procedure?   An IV will be inserted into one of your veins.  Sticky patches (electrodes) or metal paddles may be placed on your chest.  You will be given a medicine to help you relax (sedative).  An electrical shock will be delivered. The procedure may vary among health care providers and hospitals. What can I expect after the procedure?  Your blood pressure, heart rate, breathing rate, and blood oxygen level will be monitored until you leave the hospital or clinic.  Your heart rhythm will be watched to make sure it does not change.  You may have some redness on the skin where the shocks were given. Follow these instructions at home:  Do not drive for 24 hours if you were given a sedative during your procedure.  Take over-the-counter and prescription medicines only as told by your health care provider.  Ask your health care provider how to check your pulse. Check it often.  Rest for 48 hours after the procedure or as told by your health care provider.  Avoid or limit your caffeine use as told by your health care provider.  Keep all follow-up visits as told by your health care provider. This is important. Contact a health care provider if:  You feel like your heart is beating too quickly or your pulse is not regular.  You have a serious muscle cramp that does not go away. Get help right away if:  You have discomfort in your chest.  You are dizzy or you feel faint.  You have trouble breathing or you are short of breath.  Your speech is slurred.  You have trouble moving an arm or leg on one side of your body.  Your fingers or toes turn cold or blue. Summary  Electrical cardioversion is the delivery of a jolt of electricity to restore a normal rhythm to the heart.  This procedure may  be done right away in an emergency or may be a scheduled procedure if the condition is not an emergency.  Generally, this is a safe procedure.  After the procedure, check your pulse often as told by your health care provider. This information is not intended to replace advice given to you by your health care provider. Make sure you discuss any questions you have with your health care provider. Document Revised: 12/14/2018 Document Reviewed: 12/14/2018 Elsevier Patient Education  [place] After These instructions provide you with information about caring for yourself after your procedure. Your health care provider may also give you more specific instructions. Your treatment has been planned according to current medical practices, but problems sometimes occur. Call your health care provider if you have any problems or questions after your procedure. What can I expect after the  procedure? After your procedure, you may:  Feel sleepy for several hours.  Feel clumsy and have poor balance for several hours.  Feel forgetful about what happened after the procedure.  Have poor judgment for several hours.  Feel nauseous or vomit.  Have a sore throat if you had a breathing tube during the procedure. Follow these instructions at home: For at least 24 hours after the procedure:      Have a responsible adult stay with you. It is important to have someone help care for you until you are awake and alert.  Rest as needed.  Do not: ? Participate in activities in which you could fall or become injured. ? Drive. ? Use heavy machinery. ? Drink alcohol. ? Take sleeping pills or medicines that cause drowsiness. ? Make important decisions or sign legal documents. ? Take care of children on your own. Eating and drinking  Follow the diet that is recommended by your health care provider.  If you vomit, drink water, juice, or soup when you can drink without  vomiting.  Make sure you have little or no nausea before eating solid foods. General instructions  Take over-the-counter and prescription medicines only as told by your health care provider.  If you have sleep apnea, surgery and certain medicines can increase your risk for breathing problems. Follow instructions from your health care provider about wearing your sleep device: ? Anytime you are sleeping, including during daytime naps. ? While taking prescription pain medicines, sleeping medicines, or medicines that make you drowsy.  If you smoke, do not smoke without supervision.  Keep all follow-up visits as told by your health care provider. This is important. Contact a health care provider if:  You keep feeling nauseous or you keep vomiting.  You feel light-headed.  You develop a rash.  You have a fever. Get help right away if:  You have trouble breathing. Summary  For several hours after your procedure, you may feel sleepy and have poor judgment.  Have a responsible adult stay with you for at least 24 hours or until you are awake and alert. This information is not intended to replace advice given to you by your health care provider. Make sure you discuss any questions you have with your health care provider. Document Revised: 08/11/2017 Document Reviewed: 09/03/2015 Elsevier Patient Education  [place].

## 2019-08-09 DIAGNOSIS — Z23 Encounter for immunization: Secondary | ICD-10-CM | POA: Diagnosis not present

## 2019-08-10 ENCOUNTER — Other Ambulatory Visit (HOSPITAL_COMMUNITY)
Admission: RE | Admit: 2019-08-10 | Discharge: 2019-08-10 | Disposition: A | Discharge status: Home / Self Care | Payer: Medicare Other | Source: Ambulatory Visit | Attending: Cardiovascular Disease | Admitting: Cardiovascular Disease

## 2019-08-10 ENCOUNTER — Encounter (HOSPITAL_COMMUNITY)
Admission: RE | Admit: 2019-08-10 | Discharge: 2019-08-10 | Disposition: A | Discharge status: Home / Self Care | Payer: Medicare Other | Source: Ambulatory Visit | Attending: Cardiovascular Disease | Admitting: Cardiovascular Disease

## 2019-08-10 ENCOUNTER — Other Ambulatory Visit: Payer: Self-pay

## 2019-08-10 DIAGNOSIS — I2510 Atherosclerotic heart disease of native coronary artery without angina pectoris: Secondary | ICD-10-CM | POA: Diagnosis not present

## 2019-08-10 DIAGNOSIS — Z01812 Encounter for preprocedural laboratory examination: Secondary | ICD-10-CM | POA: Diagnosis not present

## 2019-08-10 DIAGNOSIS — J449 Chronic obstructive pulmonary disease, unspecified: Secondary | ICD-10-CM | POA: Diagnosis not present

## 2019-08-10 DIAGNOSIS — Z87891 Personal history of nicotine dependence: Secondary | ICD-10-CM | POA: Diagnosis not present

## 2019-08-10 DIAGNOSIS — E119 Type 2 diabetes mellitus without complications: Secondary | ICD-10-CM | POA: Insufficient documentation

## 2019-08-10 DIAGNOSIS — Z20822 Contact with and (suspected) exposure to covid-19: Secondary | ICD-10-CM | POA: Insufficient documentation

## 2019-08-10 DIAGNOSIS — K219 Gastro-esophageal reflux disease without esophagitis: Secondary | ICD-10-CM | POA: Insufficient documentation

## 2019-08-10 DIAGNOSIS — Z794 Long term (current) use of insulin: Secondary | ICD-10-CM | POA: Diagnosis not present

## 2019-08-10 DIAGNOSIS — I4891 Unspecified atrial fibrillation: Secondary | ICD-10-CM | POA: Diagnosis not present

## 2019-08-10 DIAGNOSIS — Z79899 Other long term (current) drug therapy: Secondary | ICD-10-CM | POA: Diagnosis not present

## 2019-08-10 LAB — BASIC METABOLIC PANEL
Anion gap: 11 (ref 5–15)
BUN: 27 mg/dL — ABNORMAL HIGH (ref 8–23)
CO2: 26 mmol/L (ref 22–32)
Calcium: 9 mg/dL (ref 8.9–10.3)
Chloride: 102 mmol/L (ref 98–111)
Creatinine, Ser: 1.07 mg/dL — ABNORMAL HIGH (ref 0.44–1.00)
GFR calc Af Amer: 60 mL/min — ABNORMAL LOW (ref >60)
GFR calc non Af Amer: 51 mL/min — ABNORMAL LOW (ref >60)
Glucose, Bld: 127 mg/dL — ABNORMAL HIGH (ref 70–99)
Potassium: 4.6 mmol/L (ref 3.5–5.1)
Sodium: 139 mmol/L (ref 135–145)

## 2019-08-10 LAB — CBC
HCT: 40.1 % (ref 36.0–46.0)
Hemoglobin: 11.8 g/dL — ABNORMAL LOW (ref 12.0–15.0)
MCH: 24.2 pg — ABNORMAL LOW (ref 26.0–34.0)
MCHC: 29.4 g/dL — ABNORMAL LOW (ref 30.0–36.0)
MCV: 82.3 fL (ref 80.0–100.0)
Platelets: 290 10*3/uL (ref 150–400)
RBC: 4.87 MIL/uL (ref 3.87–5.11)
RDW: 18.5 % — ABNORMAL HIGH (ref 11.5–15.5)
WBC: 11.9 10*3/uL — ABNORMAL HIGH (ref 4.0–10.5)
nRBC: 0 % (ref 0.0–0.2)

## 2019-08-10 LAB — SARS CORONAVIRUS 2 (TAT 6-24 HRS): SARS Coronavirus 2: NEGATIVE

## 2019-08-10 LAB — PROTIME-INR
INR: 1.1 (ref 0.8–1.2)
Prothrombin Time: 14.1 seconds (ref 11.4–15.2)

## 2019-08-11 ENCOUNTER — Telehealth: Payer: Self-pay

## 2019-08-11 NOTE — Telephone Encounter (Signed)
Pt states she has an upcoming procedure. Pt is asking if she should take her blood thinner.  Please call [phone]  Thanks renee

## 2019-08-11 NOTE — Telephone Encounter (Signed)
Pt made aware to take blood thinner.

## 2019-08-11 NOTE — Telephone Encounter (Signed)
Returned pt call. No answer, left message for pt to return call.  

## 2019-08-12 ENCOUNTER — Ambulatory Visit (HOSPITAL_COMMUNITY)
Admission: RE | Admit: 2019-08-12 | Discharge: 2019-08-12 | Disposition: A | Discharge status: Home / Self Care | Payer: Medicare Other | Attending: Cardiovascular Disease | Admitting: Cardiovascular Disease

## 2019-08-12 ENCOUNTER — Encounter (HOSPITAL_COMMUNITY)
Admission: RE | Disposition: A | Discharge status: Home / Self Care | Payer: Self-pay | Source: Home / Self Care | Attending: Cardiovascular Disease

## 2019-08-12 ENCOUNTER — Ambulatory Visit (HOSPITAL_COMMUNITY): Payer: Medicare Other | Admitting: Anesthesiology

## 2019-08-12 ENCOUNTER — Encounter (HOSPITAL_COMMUNITY): Payer: Self-pay | Admitting: Cardiovascular Disease

## 2019-08-12 DIAGNOSIS — Z955 Presence of coronary angioplasty implant and graft: Secondary | ICD-10-CM | POA: Diagnosis not present

## 2019-08-12 DIAGNOSIS — I5032 Chronic diastolic (congestive) heart failure: Secondary | ICD-10-CM | POA: Diagnosis not present

## 2019-08-12 DIAGNOSIS — Z888 Allergy status to other drugs, medicaments and biological substances status: Secondary | ICD-10-CM | POA: Insufficient documentation

## 2019-08-12 DIAGNOSIS — H42 Glaucoma in diseases classified elsewhere: Secondary | ICD-10-CM | POA: Diagnosis not present

## 2019-08-12 DIAGNOSIS — J449 Chronic obstructive pulmonary disease, unspecified: Secondary | ICD-10-CM | POA: Insufficient documentation

## 2019-08-12 DIAGNOSIS — G709 Myoneural disorder, unspecified: Secondary | ICD-10-CM | POA: Insufficient documentation

## 2019-08-12 DIAGNOSIS — Z86718 Personal history of other venous thrombosis and embolism: Secondary | ICD-10-CM | POA: Insufficient documentation

## 2019-08-12 DIAGNOSIS — I480 Paroxysmal atrial fibrillation: Secondary | ICD-10-CM | POA: Insufficient documentation

## 2019-08-12 DIAGNOSIS — M1990 Unspecified osteoarthritis, unspecified site: Secondary | ICD-10-CM | POA: Insufficient documentation

## 2019-08-12 DIAGNOSIS — Z87891 Personal history of nicotine dependence: Secondary | ICD-10-CM | POA: Diagnosis not present

## 2019-08-12 DIAGNOSIS — E039 Hypothyroidism, unspecified: Secondary | ICD-10-CM | POA: Diagnosis not present

## 2019-08-12 DIAGNOSIS — Z6841 Body Mass Index (BMI) 40.0 and over, adult: Secondary | ICD-10-CM | POA: Insufficient documentation

## 2019-08-12 DIAGNOSIS — E1142 Type 2 diabetes mellitus with diabetic polyneuropathy: Secondary | ICD-10-CM | POA: Insufficient documentation

## 2019-08-12 DIAGNOSIS — E119 Type 2 diabetes mellitus without complications: Secondary | ICD-10-CM | POA: Diagnosis not present

## 2019-08-12 DIAGNOSIS — N189 Chronic kidney disease, unspecified: Secondary | ICD-10-CM | POA: Diagnosis not present

## 2019-08-12 DIAGNOSIS — Z801 Family history of malignant neoplasm of trachea, bronchus and lung: Secondary | ICD-10-CM | POA: Insufficient documentation

## 2019-08-12 DIAGNOSIS — Z86010 Personal history of colonic polyps: Secondary | ICD-10-CM | POA: Insufficient documentation

## 2019-08-12 DIAGNOSIS — I25119 Atherosclerotic heart disease of native coronary artery with unspecified angina pectoris: Secondary | ICD-10-CM | POA: Diagnosis not present

## 2019-08-12 DIAGNOSIS — R0609 Other forms of dyspnea: Secondary | ICD-10-CM | POA: Insufficient documentation

## 2019-08-12 DIAGNOSIS — K279 Peptic ulcer, site unspecified, unspecified as acute or chronic, without hemorrhage or perforation: Secondary | ICD-10-CM | POA: Insufficient documentation

## 2019-08-12 DIAGNOSIS — K449 Diaphragmatic hernia without obstruction or gangrene: Secondary | ICD-10-CM | POA: Insufficient documentation

## 2019-08-12 DIAGNOSIS — I130 Hypertensive heart and chronic kidney disease with heart failure and stage 1 through stage 4 chronic kidney disease, or unspecified chronic kidney disease: Secondary | ICD-10-CM | POA: Insufficient documentation

## 2019-08-12 DIAGNOSIS — K219 Gastro-esophageal reflux disease without esophagitis: Secondary | ICD-10-CM | POA: Insufficient documentation

## 2019-08-12 DIAGNOSIS — Z7901 Long term (current) use of anticoagulants: Secondary | ICD-10-CM | POA: Diagnosis not present

## 2019-08-12 DIAGNOSIS — Z794 Long term (current) use of insulin: Secondary | ICD-10-CM | POA: Diagnosis not present

## 2019-08-12 DIAGNOSIS — E2740 Unspecified adrenocortical insufficiency: Secondary | ICD-10-CM | POA: Insufficient documentation

## 2019-08-12 DIAGNOSIS — I350 Nonrheumatic aortic (valve) stenosis: Secondary | ICD-10-CM | POA: Insufficient documentation

## 2019-08-12 DIAGNOSIS — E785 Hyperlipidemia, unspecified: Secondary | ICD-10-CM | POA: Insufficient documentation

## 2019-08-12 DIAGNOSIS — Z7952 Long term (current) use of systemic steroids: Secondary | ICD-10-CM | POA: Diagnosis not present

## 2019-08-12 DIAGNOSIS — E1139 Type 2 diabetes mellitus with other diabetic ophthalmic complication: Secondary | ICD-10-CM | POA: Diagnosis not present

## 2019-08-12 DIAGNOSIS — Z8249 Family history of ischemic heart disease and other diseases of the circulatory system: Secondary | ICD-10-CM | POA: Insufficient documentation

## 2019-08-12 DIAGNOSIS — F419 Anxiety disorder, unspecified: Secondary | ICD-10-CM | POA: Insufficient documentation

## 2019-08-12 DIAGNOSIS — E1122 Type 2 diabetes mellitus with diabetic chronic kidney disease: Secondary | ICD-10-CM | POA: Insufficient documentation

## 2019-08-12 DIAGNOSIS — Z79899 Other long term (current) drug therapy: Secondary | ICD-10-CM | POA: Insufficient documentation

## 2019-08-12 DIAGNOSIS — I4891 Unspecified atrial fibrillation: Secondary | ICD-10-CM | POA: Diagnosis not present

## 2019-08-12 DIAGNOSIS — I491 Atrial premature depolarization: Secondary | ICD-10-CM | POA: Insufficient documentation

## 2019-08-12 DIAGNOSIS — Z800 Family history of malignant neoplasm of digestive organs: Secondary | ICD-10-CM | POA: Insufficient documentation

## 2019-08-12 DIAGNOSIS — I4819 Other persistent atrial fibrillation: Secondary | ICD-10-CM

## 2019-08-12 DIAGNOSIS — R002 Palpitations: Secondary | ICD-10-CM | POA: Insufficient documentation

## 2019-08-12 DIAGNOSIS — Z886 Allergy status to analgesic agent status: Secondary | ICD-10-CM | POA: Insufficient documentation

## 2019-08-12 HISTORY — PX: CARDIOVERSION: SHX1299

## 2019-08-12 LAB — GLUCOSE, CAPILLARY: Glucose-Capillary: 264 mg/dL — ABNORMAL HIGH (ref 70–99)

## 2019-08-12 SURGERY — CARDIOVERSION
Anesthesia: General

## 2019-08-12 MED ORDER — LACTATED RINGERS IV SOLN
Freq: Once | INTRAVENOUS | Status: AC
  Administered 2019-08-12: 1000 mL via INTRAVENOUS

## 2019-08-12 MED ORDER — KETAMINE HCL 50 MG/5ML IJ SOSY
PREFILLED_SYRINGE | INTRAMUSCULAR | Status: AC
  Filled 2019-08-12: qty 5

## 2019-08-12 MED ORDER — LACTATED RINGERS IV SOLN: INTRAVENOUS | Status: DC | PRN

## 2019-08-12 MED ORDER — PROPOFOL 10 MG/ML IV BOLUS
INTRAVENOUS | Status: DC | PRN
  Administered 2019-08-12: 75 mg via INTRAVENOUS

## 2019-08-12 NOTE — Op Note (Signed)
Elective direct current cardioversion  Indication: Persistent atrial fibrillation  Description of procedure: After informed consent was obtained and preprocedure evaluation by Anesthesia, patient was taken to the procedure room. Timeout performed. Sedation was managed completely by the Anesthesia service, please refer to their documentation for details. Anterior and posterior chest pads were placed and connected to a biphasic defibrillator. A single synchronized shock of 150J was delivered resulting in restoration of sinus rhythm. The patient remained hemodynamically stable throughout, and there were no immediate complications noted. Follow-up ECG obtained.  Kairee Kozma, M.D., F.A.C.C.  

## 2019-08-12 NOTE — Interval H&P Note (Signed)
History and Physical Interval Note: No changes. Will proceed with DCCV as planned.  08/12/2019 9:56 AM  Ann [name]  has presented today for surgery, with the diagnosis of a-fib.  The various methods of treatment have been discussed with the patient and family. After consideration of risks, benefits and other options for treatment, the patient has consented to  Procedure(s): CARDIOVERSION (N/A) as a surgical intervention.  The patient's history has been reviewed, patient examined, no change in status, stable for surgery.  I have reviewed the patient's chart and labs.  Questions were answered to the patient's satisfaction.     Kate Sable

## 2019-08-12 NOTE — Discharge Instructions (Signed)
Electrical Cardioversion Electrical cardioversion is the delivery of a jolt of electricity to restore a normal rhythm to the heart. A rhythm that is too fast or is not regular keeps the heart from pumping well. In this procedure, sticky patches or metal paddles are placed on the chest to deliver electricity to the heart from a device. This procedure may be done in an emergency if:  There is low or no blood pressure as a result of the heart rhythm.  Normal rhythm must be restored as fast as possible to protect the brain and heart from further damage.  It may save a life. This may also be a scheduled procedure for irregular or fast heart rhythms that are not immediately life-threatening. Tell a health care provider about:  Any allergies you have.  All medicines you are taking, including vitamins, herbs, eye drops, creams, and over-the-counter medicines.  Any problems you or family members have had with anesthetic medicines.  Any blood disorders you have.  Any surgeries you have had.  Any medical conditions you have.  Whether you are pregnant or may be pregnant. What are the risks? Generally, this is a safe procedure. However, problems may occur, including:  Allergic reactions to medicines.  A blood clot that breaks free and travels to other parts of your body.  The possible return of an abnormal heart rhythm within hours or days after the procedure.  Your heart stopping (cardiac arrest). This is rare. What happens before the procedure? Medicines  Your health care provider may have you start taking: ? Blood-thinning medicines (anticoagulants) so your blood does not clot as easily. ? Medicines to help stabilize your heart rate and rhythm.  Ask your health care provider about: ? Changing or stopping your regular medicines. This is especially important if you are taking diabetes medicines or blood thinners. ? Taking medicines such as aspirin and ibuprofen. These medicines can  thin your blood. Do not take these medicines unless your health care provider tells you to take them. ? Taking over-the-counter medicines, vitamins, herbs, and supplements. General instructions  Follow instructions from your health care provider about eating or drinking restrictions.  Plan to have someone take you home from the hospital or clinic.  If you will be going home right after the procedure, plan to have someone with you for 24 hours.  Ask your health care provider what steps will be taken to help prevent infection. These may include washing your skin with a germ-killing soap. What happens during the procedure?   An IV will be inserted into one of your veins.  Sticky patches (electrodes) or metal paddles may be placed on your chest.  You will be given a medicine to help you relax (sedative).  An electrical shock will be delivered. The procedure may vary among health care providers and hospitals. What can I expect after the procedure?  Your blood pressure, heart rate, breathing rate, and blood oxygen level will be monitored until you leave the hospital or clinic.  Your heart rhythm will be watched to make sure it does not change.  You may have some redness on the skin where the shocks were given. Follow these instructions at home:  Do not drive for 24 hours if you were given a sedative during your procedure.  Take over-the-counter and prescription medicines only as told by your health care provider.  Ask your health care provider how to check your pulse. Check it often.  Rest for 48 hours after the procedure or   as told by your health care provider.  Avoid or limit your caffeine use as told by your health care provider.  Keep all follow-up visits as told by your health care provider. This is important. Contact a health care provider if:  You feel like your heart is beating too quickly or your pulse is not regular.  You have a serious muscle cramp that does not go  away. Get help right away if:  You have discomfort in your chest.  You are dizzy or you feel faint.  You have trouble breathing or you are short of breath.  Your speech is slurred.  You have trouble moving an arm or leg on one side of your body.  Your fingers or toes turn cold or blue. Summary  Electrical cardioversion is the delivery of a jolt of electricity to restore a normal rhythm to the heart.  This procedure may be done right away in an emergency or may be a scheduled procedure if the condition is not an emergency.  Generally, this is a safe procedure.  After the procedure, check your pulse often as told by your health care provider. This information is not intended to replace advice given to you by your health care provider. Make sure you discuss any questions you have with your health care provider. Document Revised: 12/14/2018 Document Reviewed: 12/14/2018 Elsevier Patient Education  [place] Anesthesia is a term that refers to techniques, procedures, and medicines that help a person stay safe and comfortable during a medical procedure. Monitored anesthesia care, or sedation, is one type of anesthesia. Your anesthesia specialist may recommend sedation if you will be having a procedure that does not require you to be unconscious, such as:  Cataract surgery.  A dental procedure.  A biopsy.  A colonoscopy. During the procedure, you may receive a medicine to help you relax (sedative). There are three levels of sedation:  Mild sedation. At this level, you may feel awake and relaxed. You will be able to follow directions.  Moderate sedation. At this level, you will be sleepy. You may not remember the procedure.  Deep sedation. At this level, you will be asleep. You will not remember the procedure. The more medicine you are given, the deeper your level of sedation will be. Depending on how you respond to the procedure, the  anesthesia specialist may change your level of sedation or the type of anesthesia to fit your needs. An anesthesia specialist will monitor you closely during the procedure. Let your health care provider know about:  Any allergies you have.  All medicines you are taking, including vitamins, herbs, eye drops, creams, and over-the-counter medicines.  Any use of steroids (by mouth or as a cream).  Any problems you or family members have had with sedatives and anesthetic medicines.  Any blood disorders you have.  Any surgeries you have had.  Any medical conditions you have, such as sleep apnea.  Whether you are pregnant or may be pregnant.  Any use of cigarettes, alcohol, or street drugs. What are the risks? Generally, this is a safe procedure. However, problems may occur, including:  Getting too much medicine (oversedation).  Nausea.  Allergic reaction to medicines.  Trouble breathing. If this happens, a breathing tube may be used to help with breathing. It will be removed when you are awake and breathing on your own.  Heart trouble.  Lung trouble. Before the procedure Staying hydrated Follow instructions from your health care provider  about hydration, which may include:  Up to 2 hours before the procedure - you may continue to drink clear liquids, such as water, clear fruit juice, black coffee, and plain tea. Eating and drinking restrictions Follow instructions from your health care provider about eating and drinking, which may include:  8 hours before the procedure - stop eating heavy meals or foods such as meat, fried foods, or fatty foods.  6 hours before the procedure - stop eating light meals or foods, such as toast or cereal.  6 hours before the procedure - stop drinking milk or drinks that contain milk.  2 hours before the procedure - stop drinking clear liquids. Medicines Ask your health care provider about:  Changing or stopping your regular medicines. This  is especially important if you are taking diabetes medicines or blood thinners.  Taking medicines such as aspirin and ibuprofen. These medicines can thin your blood. Do not take these medicines before your procedure if your health care provider instructs you not to. Tests and exams  You will have a physical exam.  You may have blood tests done to show: ? How well your kidneys and liver are working. ? How well your blood can clot. General instructions  Plan to have someone take you home from the hospital or clinic.  If you will be going home right after the procedure, plan to have someone with you for 24 hours.  What happens during the procedure?  Your blood pressure, heart rate, breathing, level of pain and overall condition will be monitored.  An IV tube will be inserted into one of your veins.  Your anesthesia specialist will give you medicines as needed to keep you comfortable during the procedure. This may mean changing the level of sedation.  The procedure will be performed. After the procedure  Your blood pressure, heart rate, breathing rate, and blood oxygen level will be monitored until the medicines you were given have worn off.  Do not drive for 24 hours if you received a sedative.  You may: ? Feel sleepy, clumsy, or nauseous. ? Feel forgetful about what happened after the procedure. ? Have a sore throat if you had a breathing tube during the procedure. ? Vomit. This information is not intended to replace advice given to you by your health care provider. Make sure you discuss any questions you have with your health care provider. Document Revised: 04/25/2017 Document Reviewed: 09/03/2015 Elsevier Patient Education  [place].

## 2019-08-12 NOTE — Anesthesia Postprocedure Evaluation (Signed)
Anesthesia Post Note  Patient: Ann [name]  Procedure(s) Performed: CARDIOVERSION (N/A )  Patient location during evaluation: PACU Anesthesia Type: General Level of consciousness: awake and alert and patient cooperative Pain management: satisfactory to patient Vital Signs Assessment: post-procedure vital signs reviewed and stable Respiratory status: spontaneous breathing Cardiovascular status: stable Postop Assessment: no apparent nausea or vomiting Anesthetic complications: no     Last Vitals:  Vitals:   08/12/19 1104 08/12/19 1110  BP: (!) 86/70 101/89  Pulse: 66 (!) 59  Resp: 14 19  Temp: 37.3 C   SpO2: 96% 98%    Last Pain:  Vitals:   08/12/19 1104  TempSrc:   PainSc: 0-No pain                 [name]

## 2019-08-12 NOTE — Anesthesia Preprocedure Evaluation (Signed)
Anesthesia Evaluation  Patient identified by MRN, date of birth, ID band Patient awake    Reviewed: Allergy & Precautions, NPO status , Patient's Chart, lab work & pertinent test results  Airway Mallampati: III  TM Distance: >3 FB Neck ROM: Full    Dental  (+) Edentulous Upper, Edentulous Lower   Pulmonary COPD,  COPD inhaler, former smoker,    breath sounds clear to auscultation       Cardiovascular Exercise Tolerance: Poor hypertension, Pt. on medications + angina + CAD and + Cardiac Stents  + dysrhythmias Atrial Fibrillation  Rhythm:Irregular Rate:Tachycardia     Neuro/Psych Anxiety  Neuromuscular disease    GI/Hepatic hiatal hernia, PUD, GERD  Medicated and Controlled,  Endo/Other  diabetes, Well Controlled, Type 2, Insulin Dependent, Oral Hypoglycemic AgentsHypothyroidism   Renal/GU Renal disease     Musculoskeletal  (+) Arthritis ,   Abdominal   Peds  Hematology   Anesthesia Other Findings   Reproductive/Obstetrics                             Anesthesia Physical Anesthesia Plan  ASA: III  Anesthesia Plan: General   Post-op Pain Management:    Induction: Intravenous  PONV Risk Score and Plan: 2 and TIVA and Treatment may vary due to age or medical condition  Airway Management Planned: Nasal Cannula, Natural Airway and Simple Face Mask  Additional Equipment:   Intra-op Plan:   Post-operative Plan:   Informed Consent: I have reviewed the patients History and Physical, chart, labs and discussed the procedure including the risks, benefits and alternatives for the proposed anesthesia with the patient or authorized representative who has indicated his/her understanding and acceptance.     Dental advisory given  Plan Discussed with: CRNA and Surgeon  Anesthesia Plan Comments:         Anesthesia Quick Evaluation

## 2019-08-12 NOTE — Anesthesia Procedure Notes (Signed)
Date/Time: 08/12/2019 10:42 AM Performed by: Vista Deck, CRNA Pre-anesthesia Checklist: Patient identified, Emergency Drugs available, Suction available, Timeout performed and Patient being monitored Patient Re-evaluated:Patient Re-evaluated prior to induction Oxygen Delivery Method: Nasal Cannula

## 2019-08-12 NOTE — Transfer of Care (Signed)
Immediate Anesthesia Transfer of Care Note  Patient: Ann [name]  Procedure(s) Performed: CARDIOVERSION (N/A )  Patient Location: PACU  Anesthesia Type:General  Level of Consciousness: awake, alert  and patient cooperative  Airway & Oxygen Therapy: Patient Spontanous Breathing and Patient connected to nasal cannula oxygen  Post-op Assessment: Report given to RN and Post -op Vital signs reviewed and stable  Post vital signs: Reviewed and stable  Last Vitals:  Vitals Value Taken Time  BP    Temp    Pulse    Resp    SpO2      Last Pain:  Vitals:   08/12/19 0955  TempSrc: Oral  PainSc: 0-No pain      Patients Stated Pain Goal: 8 ([id number])  Complications: No apparent anesthesia complications  SEE PACU FLOW SHEET FOR VITAL SIGNS

## 2019-08-12 NOTE — Progress Notes (Signed)
Electrical Cardioversion Procedure Note Ann [name] [id number] 02/10/1946  Procedure: Electrical Cardioversion Indications:  Atrial Fibrillation  Procedure Details Consent: Risks of procedure as well as the alternatives and risks of each were explained to the (patient/caregiver).  Consent for procedure obtained. Time Out: Verified patient identification, verified procedure, site/side was marked, verified correct patient position, special equipment/implants available, medications/allergies/relevent history reviewed, required imaging and test results available.  Performed  Patient placed on cardiac monitor, pulse oximetry, supplemental oxygen as necessary.  Sedation given: propofol administered by T. Yates, CRNA Pacer pads placed anterior and posterior chest.  Cardioverted 1 time(s).  Cardioverted at 150J.  Evaluation Findings: Post procedure EKG shows: NSR with unifocal PVCs Complications: None Patient did tolerate procedure well.   Charm Barges S 08/12/2019, 11:31 AM

## 2019-08-31 ENCOUNTER — Ambulatory Visit (INDEPENDENT_AMBULATORY_CARE_PROVIDER_SITE_OTHER): Payer: Medicare Other | Admitting: Family Medicine

## 2019-08-31 ENCOUNTER — Other Ambulatory Visit: Payer: Self-pay

## 2019-08-31 ENCOUNTER — Encounter: Payer: Self-pay | Admitting: Family Medicine

## 2019-08-31 VITALS — BP 154/74 | HR 87 | Temp 98.0°F | Ht 63.0 in | Wt 274.4 lb

## 2019-08-31 DIAGNOSIS — K219 Gastro-esophageal reflux disease without esophagitis: Secondary | ICD-10-CM

## 2019-08-31 DIAGNOSIS — I25118 Atherosclerotic heart disease of native coronary artery with other forms of angina pectoris: Secondary | ICD-10-CM | POA: Diagnosis not present

## 2019-08-31 DIAGNOSIS — I4891 Unspecified atrial fibrillation: Secondary | ICD-10-CM | POA: Diagnosis not present

## 2019-08-31 DIAGNOSIS — M25562 Pain in left knee: Secondary | ICD-10-CM | POA: Diagnosis not present
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
## 2019-08-31 NOTE — Patient Instructions (Addendum)
Diabetic shoes-pt requested through [place] follow up appointment with primary care doctor  If you have lab work done today you will be contacted with your lab results within the next 2 weeks.  If you have not heard from [place] then please contact us. The fastest way to get your results is to register for My Chart.   IF you received an x-ray today, you will receive an invoice from [facility]. Please contact [facility] at [phone] with questions or concerns regarding your invoice.   IF you received labwork today, you will receive an invoice from [place]. Please contact [facility] at [phone] with questions or concerns regarding your invoice.   Our billing staff will not be able to assist you with questions regarding bills from these companies.  You will be contacted with the lab results as soon as they are available. The fastest way to get your results is to activate your My Chart account. Instructions are located on the last page of this paperwork. If you have not heard from [place] regarding the results in 2 weeks, please contact this office.
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
## 2019-09-02 ENCOUNTER — Other Ambulatory Visit: Payer: Self-pay

## 2019-09-02 ENCOUNTER — Encounter: Payer: Self-pay | Admitting: Student

## 2019-09-02 ENCOUNTER — Ambulatory Visit (INDEPENDENT_AMBULATORY_CARE_PROVIDER_SITE_OTHER): Payer: Medicare Other | Admitting: Student

## 2019-09-02 VITALS — BP 116/68 | HR 74 | Ht 63.0 in | Wt 272.0 lb

## 2019-09-02 DIAGNOSIS — I2510 Atherosclerotic heart disease of native coronary artery without angina pectoris: Secondary | ICD-10-CM | POA: Diagnosis not present

## 2019-09-02 DIAGNOSIS — I5032 Chronic diastolic (congestive) heart failure: Secondary | ICD-10-CM | POA: Diagnosis not present

## 2019-09-02 DIAGNOSIS — E782 Mixed hyperlipidemia: Secondary | ICD-10-CM | POA: Diagnosis not present

## 2019-09-02 DIAGNOSIS — I10 Essential (primary) hypertension: Secondary | ICD-10-CM | POA: Diagnosis not present

## 2019-09-02 DIAGNOSIS — I4819 Other persistent atrial fibrillation: Secondary | ICD-10-CM

## 2019-09-02 DIAGNOSIS — I25119 Atherosclerotic heart disease of native coronary artery with unspecified angina pectoris: Secondary | ICD-10-CM | POA: Diagnosis not present

## 2019-09-02 NOTE — Patient Instructions (Signed)
Medication Instructions:  Your physician recommends that you continue on your current medications as directed. Please refer to the Current Medication list given to you today.  *If you need a refill on your cardiac medications before your next appointment, please call your pharmacy*   Lab Work: NONE  If you have labs (blood work) drawn today and your tests are completely normal, you will receive your results only by: Marland Kitchen MyChart Message (if you have MyChart) OR . A paper copy in the mail If you have any lab test that is abnormal or we need to change your treatment, we will call you to review the results.   Testing/Procedures: NONE    Follow-Up: At [facility], you and your health needs are our priority.  As part of our continuing mission to provide you with exceptional heart care, we have created designated Provider Care Teams.  These Care Teams include your primary Cardiologist (physician) and Advanced Practice Providers (APPs -  Physician Assistants and Nurse Practitioners) who all work together to provide you with the care you need, when you need it.  We recommend signing up for the patient portal called "MyChart".  Sign up information is provided on this After Visit Summary.  MyChart is used to connect with patients for Virtual Visits (Telemedicine).  Patients are able to view lab/test results, encounter notes, upcoming appointments, etc.  Non-urgent messages can be sent to your provider as well.   To learn more about what you can do with MyChart, go to [email or web address].    Your next appointment:   3 month(s)  The format for your next appointment:   In Person  Provider:   Kate Sable, MD   Other Instructions Thank you for choosing [facility]!

## 2019-09-03 ENCOUNTER — Encounter: Payer: Self-pay | Admitting: Student

## 2019-09-03 NOTE — Addendum Note (Signed)
Addended by: Levonne Hubert on: 09/03/2019 04:23 PM   Modules accepted: Orders

## 2019-09-06 DIAGNOSIS — M1712 Unilateral primary osteoarthritis, left knee: Secondary | ICD-10-CM | POA: Diagnosis not present

## 2019-09-06 DIAGNOSIS — Z23 Encounter for immunization: Secondary | ICD-10-CM | POA: Diagnosis not present

## 2019-09-14 DIAGNOSIS — M79675 Pain in left toe(s): Secondary | ICD-10-CM | POA: Diagnosis not present

## 2019-09-14 DIAGNOSIS — L603 Nail dystrophy: Secondary | ICD-10-CM | POA: Diagnosis not present

## 2019-09-14 DIAGNOSIS — L851 Acquired keratosis [keratoderma] palmaris et plantaris: Secondary | ICD-10-CM | POA: Diagnosis not present

## 2019-09-14 DIAGNOSIS — E1142 Type 2 diabetes mellitus with diabetic polyneuropathy: Secondary | ICD-10-CM | POA: Diagnosis not present

## 2019-09-15 DIAGNOSIS — E039 Hypothyroidism, unspecified: Secondary | ICD-10-CM | POA: Diagnosis not present

## 2019-09-15 DIAGNOSIS — E1165 Type 2 diabetes mellitus with hyperglycemia: Secondary | ICD-10-CM | POA: Diagnosis not present

## 2019-09-15 DIAGNOSIS — N3281 Overactive bladder: Secondary | ICD-10-CM | POA: Diagnosis not present

## 2019-09-17 DIAGNOSIS — R9431 Abnormal electrocardiogram [ECG] [EKG]: Secondary | ICD-10-CM | POA: Diagnosis not present

## 2019-09-17 DIAGNOSIS — I499 Cardiac arrhythmia, unspecified: Secondary | ICD-10-CM | POA: Diagnosis not present

## 2019-09-17 DIAGNOSIS — I10 Essential (primary) hypertension: Secondary | ICD-10-CM | POA: Diagnosis not present

## 2019-09-17 DIAGNOSIS — Z8249 Family history of ischemic heart disease and other diseases of the circulatory system: Secondary | ICD-10-CM | POA: Diagnosis not present

## 2019-09-20 DIAGNOSIS — Z79899 Other long term (current) drug therapy: Secondary | ICD-10-CM | POA: Diagnosis not present

## 2019-09-20 DIAGNOSIS — E1165 Type 2 diabetes mellitus with hyperglycemia: Secondary | ICD-10-CM | POA: Diagnosis not present

## 2019-09-20 DIAGNOSIS — E039 Hypothyroidism, unspecified: Secondary | ICD-10-CM | POA: Diagnosis not present

## 2019-09-20 DIAGNOSIS — Z0001 Encounter for general adult medical examination with abnormal findings: Secondary | ICD-10-CM | POA: Diagnosis not present

## 2019-10-12 ENCOUNTER — Encounter: Payer: Self-pay | Admitting: "Endocrinology

## 2019-10-12 ENCOUNTER — Ambulatory Visit (INDEPENDENT_AMBULATORY_CARE_PROVIDER_SITE_OTHER): Payer: Medicare Other | Admitting: "Endocrinology

## 2019-10-12 VITALS — BP 150/83 | HR 90 | Ht 63.0 in | Wt 265.6 lb

## 2019-10-12 DIAGNOSIS — E782 Mixed hyperlipidemia: Secondary | ICD-10-CM | POA: Diagnosis not present

## 2019-10-12 DIAGNOSIS — I25119 Atherosclerotic heart disease of native coronary artery with unspecified angina pectoris: Secondary | ICD-10-CM | POA: Diagnosis not present

## 2019-10-12 DIAGNOSIS — I10 Essential (primary) hypertension: Secondary | ICD-10-CM | POA: Diagnosis not present

## 2019-10-12 DIAGNOSIS — E039 Hypothyroidism, unspecified: Secondary | ICD-10-CM

## 2019-10-12 DIAGNOSIS — E2740 Unspecified adrenocortical insufficiency: Secondary | ICD-10-CM | POA: Diagnosis not present

## 2019-10-12 DIAGNOSIS — E1165 Type 2 diabetes mellitus with hyperglycemia: Secondary | ICD-10-CM

## 2019-10-12 LAB — POCT GLYCOSYLATED HEMOGLOBIN (HGB A1C): Hemoglobin A1C: 7.7 % — AB (ref 4.0–5.6)

## 2019-10-12 MED ORDER — LEVEMIR FLEXTOUCH 100 UNIT/ML ~~LOC~~ SOPN: 30.0000 [IU] | PEN_INJECTOR | Freq: Two times a day (BID) | SUBCUTANEOUS | 1 refills | Status: DC

## 2019-10-12 MED ORDER — METFORMIN HCL 500 MG PO TABS: 500.0000 mg | ORAL_TABLET | Freq: Two times a day (BID) | ORAL | 1 refills | Status: DC
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
## 2019-10-12 NOTE — Patient Instructions (Signed)

## 2019-10-13 ENCOUNTER — Ambulatory Visit: Payer: Medicare Other | Admitting: "Endocrinology

## 2019-10-18 DIAGNOSIS — F3340 Major depressive disorder, recurrent, in remission, unspecified: Secondary | ICD-10-CM | POA: Diagnosis not present

## 2019-10-18 DIAGNOSIS — Z1389 Encounter for screening for other disorder: Secondary | ICD-10-CM | POA: Diagnosis not present

## 2019-10-18 DIAGNOSIS — E1165 Type 2 diabetes mellitus with hyperglycemia: Secondary | ICD-10-CM | POA: Diagnosis not present

## 2019-10-18 DIAGNOSIS — Z1331 Encounter for screening for depression: Secondary | ICD-10-CM | POA: Diagnosis not present

## 2019-10-18 DIAGNOSIS — I4821 Permanent atrial fibrillation: Secondary | ICD-10-CM | POA: Diagnosis not present

## 2019-11-08 ENCOUNTER — Telehealth: Payer: Self-pay | Admitting: "Endocrinology

## 2019-11-08 NOTE — Telephone Encounter (Signed)
Patient calling to check on the status of her diabetic shoes. She said at her visit in May, that Dr Dorris Fetch was going to order her some from [place]. Please advise.

## 2019-11-08 NOTE — Telephone Encounter (Signed)
[facility] needs a rx for diabetic shoes.

## 2019-11-09 ENCOUNTER — Other Ambulatory Visit: Payer: Self-pay | Admitting: "Endocrinology

## 2019-11-09 NOTE — Telephone Encounter (Signed)
Shoes prescription is not available electronically. I will bring paper Rx tomorrow.

## 2019-11-09 NOTE — Telephone Encounter (Signed)
Noted  

## 2019-11-17 NOTE — Telephone Encounter (Signed)
Called patient to let her know we have the RX ready for pick up. Left vm.

## 2019-11-22 ENCOUNTER — Telehealth: Payer: Self-pay | Admitting: "Endocrinology

## 2019-11-22 NOTE — Telephone Encounter (Signed)
Ann [name] with [facility] is returning your call , she needs a call back [phone]

## 2019-11-22 NOTE — Telephone Encounter (Signed)
Spoke with Merrilee Seashore from [facility] [employer] regarding paperwork.

## 2019-11-23 ENCOUNTER — Encounter: Payer: Self-pay | Admitting: "Endocrinology

## 2019-11-23 ENCOUNTER — Other Ambulatory Visit: Payer: Self-pay

## 2019-11-23 ENCOUNTER — Ambulatory Visit (INDEPENDENT_AMBULATORY_CARE_PROVIDER_SITE_OTHER): Payer: Medicare Other | Admitting: "Endocrinology

## 2019-11-23 VITALS — BP 126/78 | Ht 63.0 in | Wt 267.6 lb

## 2019-11-23 DIAGNOSIS — E119 Type 2 diabetes mellitus without complications: Secondary | ICD-10-CM

## 2019-11-23 DIAGNOSIS — I25119 Atherosclerotic heart disease of native coronary artery with unspecified angina pectoris: Secondary | ICD-10-CM
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
## 2019-12-02 ENCOUNTER — Ambulatory Visit: Payer: Medicare Other | Admitting: Cardiovascular Disease

## 2019-12-10 ENCOUNTER — Other Ambulatory Visit: Payer: Self-pay

## 2019-12-10 ENCOUNTER — Observation Stay (HOSPITAL_COMMUNITY)
Admission: EM | Admit: 2019-12-10 | Discharge: 2019-12-11 | Disposition: A | Discharge status: Home / Self Care | Payer: Medicare Other | Attending: Family Medicine | Admitting: Family Medicine

## 2019-12-10 ENCOUNTER — Emergency Department (HOSPITAL_COMMUNITY): Payer: Medicare Other

## 2019-12-10 ENCOUNTER — Encounter (HOSPITAL_COMMUNITY): Payer: Self-pay | Admitting: Emergency Medicine

## 2019-12-10 DIAGNOSIS — I509 Heart failure, unspecified: Secondary | ICD-10-CM | POA: Diagnosis not present

## 2019-12-10 DIAGNOSIS — E2740 Unspecified adrenocortical insufficiency: Secondary | ICD-10-CM | POA: Diagnosis not present

## 2019-12-10 DIAGNOSIS — I110 Hypertensive heart disease with heart failure: Secondary | ICD-10-CM | POA: Insufficient documentation

## 2019-12-10 DIAGNOSIS — I10 Essential (primary) hypertension: Secondary | ICD-10-CM

## 2019-12-10 DIAGNOSIS — Z79899 Other long term (current) drug therapy: Secondary | ICD-10-CM | POA: Insufficient documentation

## 2019-12-10 DIAGNOSIS — R4182 Altered mental status, unspecified: Secondary | ICD-10-CM | POA: Diagnosis not present

## 2019-12-10 DIAGNOSIS — Z20822 Contact with and (suspected) exposure to covid-19: Secondary | ICD-10-CM | POA: Diagnosis not present

## 2019-12-10 DIAGNOSIS — Z7901 Long term (current) use of anticoagulants: Secondary | ICD-10-CM | POA: Insufficient documentation

## 2019-12-10 DIAGNOSIS — I2510 Atherosclerotic heart disease of native coronary artery without angina pectoris: Secondary | ICD-10-CM | POA: Diagnosis not present

## 2019-12-10 DIAGNOSIS — Z794 Long term (current) use of insulin: Secondary | ICD-10-CM | POA: Insufficient documentation

## 2019-12-10 DIAGNOSIS — E1165 Type 2 diabetes mellitus with hyperglycemia: Secondary | ICD-10-CM | POA: Diagnosis not present

## 2019-12-10 DIAGNOSIS — I5032 Chronic diastolic (congestive) heart failure: Secondary | ICD-10-CM

## 2019-12-10 DIAGNOSIS — N183 Chronic kidney disease, stage 3 unspecified: Secondary | ICD-10-CM | POA: Diagnosis present

## 2019-12-10 DIAGNOSIS — E039 Hypothyroidism, unspecified: Secondary | ICD-10-CM

## 2019-12-10 DIAGNOSIS — Z87891 Personal history of nicotine dependence: Secondary | ICD-10-CM | POA: Insufficient documentation

## 2019-12-10 DIAGNOSIS — K219 Gastro-esophageal reflux disease without esophagitis: Secondary | ICD-10-CM | POA: Diagnosis not present

## 2019-12-10 DIAGNOSIS — I4891 Unspecified atrial fibrillation: Secondary | ICD-10-CM | POA: Diagnosis not present

## 2019-12-10 DIAGNOSIS — R531 Weakness: Secondary | ICD-10-CM | POA: Diagnosis not present

## 2019-12-10 DIAGNOSIS — N1831 Chronic kidney disease, stage 3a: Secondary | ICD-10-CM | POA: Diagnosis not present

## 2019-12-10 DIAGNOSIS — J449 Chronic obstructive pulmonary disease, unspecified: Secondary | ICD-10-CM | POA: Insufficient documentation

## 2019-12-10 DIAGNOSIS — E1142 Type 2 diabetes mellitus with diabetic polyneuropathy: Secondary | ICD-10-CM | POA: Insufficient documentation

## 2019-12-10 DIAGNOSIS — R270 Ataxia, unspecified: Secondary | ICD-10-CM | POA: Diagnosis not present

## 2019-12-10 LAB — COMPREHENSIVE METABOLIC PANEL
ALT: 21 U/L (ref 0–44)
AST: 24 U/L (ref 15–41)
Albumin: 3.6 g/dL (ref 3.5–5.0)
Alkaline Phosphatase: 71 U/L (ref 38–126)
Anion gap: 11 (ref 5–15)
BUN: 22 mg/dL (ref 8–23)
CO2: 25 mmol/L (ref 22–32)
Calcium: 9 mg/dL (ref 8.9–10.3)
Chloride: 105 mmol/L (ref 98–111)
Creatinine, Ser: 1.23 mg/dL — ABNORMAL HIGH (ref 0.44–1.00)
GFR calc Af Amer: 50 mL/min — ABNORMAL LOW (ref >60)
GFR calc non Af Amer: 43 mL/min — ABNORMAL LOW (ref >60)
Glucose, Bld: 150 mg/dL — ABNORMAL HIGH (ref 70–99)
Potassium: 4.9 mmol/L (ref 3.5–5.1)
Sodium: 141 mmol/L (ref 135–145)
Total Bilirubin: 0.6 mg/dL (ref 0.3–1.2)
Total Protein: 6.8 g/dL (ref 6.5–8.1)

## 2019-12-10 LAB — URINALYSIS, ROUTINE W REFLEX MICROSCOPIC
Bilirubin Urine: NEGATIVE
Glucose, UA: NEGATIVE mg/dL
Hgb urine dipstick: NEGATIVE
Ketones, ur: NEGATIVE mg/dL
Nitrite: NEGATIVE
Protein, ur: NEGATIVE mg/dL
Specific Gravity, Urine: 1.009 (ref 1.005–1.030)
pH: 5 (ref 5.0–8.0)

## 2019-12-10 LAB — CBC
HCT: 36.7 % (ref 36.0–46.0)
Hemoglobin: 10.8 g/dL — ABNORMAL LOW (ref 12.0–15.0)
MCH: 24.4 pg — ABNORMAL LOW (ref 26.0–34.0)
MCHC: 29.4 g/dL — ABNORMAL LOW (ref 30.0–36.0)
MCV: 83 fL (ref 80.0–100.0)
Platelets: 242 10*3/uL (ref 150–400)
RBC: 4.42 MIL/uL (ref 3.87–5.11)
RDW: 17.1 % — ABNORMAL HIGH (ref 11.5–15.5)
WBC: 8.8 10*3/uL (ref 4.0–10.5)
nRBC: 0 % (ref 0.0–0.2)

## 2019-12-10 LAB — TROPONIN I (HIGH SENSITIVITY)
Troponin I (High Sensitivity): 18 ng/L — ABNORMAL HIGH (ref <18)
Troponin I (High Sensitivity): 18 ng/L — ABNORMAL HIGH (ref <18)

## 2019-12-10 LAB — TSH: TSH: 0.986 u[IU]/mL (ref 0.350–4.500)

## 2019-12-10 LAB — GLUCOSE, CAPILLARY: Glucose-Capillary: 143 mg/dL — ABNORMAL HIGH (ref 70–99)

## 2019-12-10 LAB — CBG MONITORING, ED: Glucose-Capillary: 159 mg/dL — ABNORMAL HIGH (ref 70–99)

## 2019-12-10 LAB — BRAIN NATRIURETIC PEPTIDE: B Natriuretic Peptide: 268 pg/mL — ABNORMAL HIGH (ref 0.0–100.0)

## 2019-12-10 LAB — SARS CORONAVIRUS 2 BY RT PCR (HOSPITAL ORDER, PERFORMED IN ~~LOC~~ HOSPITAL LAB): SARS Coronavirus 2: NEGATIVE

## 2019-12-10 MED ORDER — SODIUM CHLORIDE 0.9 % IV SOLN: INTRAVENOUS | Status: DC

## 2019-12-10 MED ORDER — ONDANSETRON HCL 4 MG PO TABS: 4.0000 mg | ORAL_TABLET | Freq: Four times a day (QID) | ORAL | Status: DC | PRN

## 2019-12-10 MED ORDER — DOCUSATE SODIUM 100 MG PO CAPS
100.0000 mg | ORAL_CAPSULE | Freq: Every day | ORAL | Status: DC
  Administered 2019-12-11: 100 mg via ORAL
  Filled 2019-12-10: qty 1

## 2019-12-10 MED ORDER — DARIFENACIN HYDROBROMIDE ER 7.5 MG PO TB24
15.0000 mg | ORAL_TABLET | Freq: Every day | ORAL | Status: DC
  Administered 2019-12-10 – 2019-12-11 (×2): 15 mg via ORAL
  Filled 2019-12-10: qty 1
  Filled 2019-12-10: qty 2
  Filled 2019-12-10 (×3): qty 1

## 2019-12-10 MED ORDER — SODIUM CHLORIDE 0.9 % IV BOLUS
250.0000 mL | Freq: Once | INTRAVENOUS | Status: AC
  Administered 2019-12-10: 250 mL via INTRAVENOUS

## 2019-12-10 MED ORDER — HYDRALAZINE HCL 25 MG PO TABS
50.0000 mg | ORAL_TABLET | Freq: Three times a day (TID) | ORAL | Status: DC
  Administered 2019-12-10 – 2019-12-11 (×2): 50 mg via ORAL
  Filled 2019-12-10 (×2): qty 2

## 2019-12-10 MED ORDER — ONDANSETRON HCL 4 MG/2ML IJ SOLN: 4.0000 mg | Freq: Four times a day (QID) | INTRAMUSCULAR | Status: DC | PRN

## 2019-12-10 MED ORDER — ALBUTEROL SULFATE (2.5 MG/3ML) 0.083% IN NEBU: 3.0000 mL | INHALATION_SOLUTION | RESPIRATORY_TRACT | Status: DC | PRN

## 2019-12-10 MED ORDER — INSULIN ASPART 100 UNIT/ML ~~LOC~~ SOLN: 0.0000 [IU] | Freq: Three times a day (TID) | SUBCUTANEOUS | Status: DC

## 2019-12-10 MED ORDER — METFORMIN HCL 500 MG PO TABS
500.0000 mg | ORAL_TABLET | Freq: Two times a day (BID) | ORAL | Status: DC
  Administered 2019-12-11: 500 mg via ORAL
  Filled 2019-12-10: qty 1

## 2019-12-10 MED ORDER — INSULIN DETEMIR 100 UNIT/ML ~~LOC~~ SOLN
30.0000 [IU] | Freq: Two times a day (BID) | SUBCUTANEOUS | Status: DC
  Administered 2019-12-10: 30 [IU] via SUBCUTANEOUS
  Filled 2019-12-10 (×6): qty 0.3

## 2019-12-10 MED ORDER — LEVOTHYROXINE SODIUM 50 MCG PO TABS
175.0000 ug | ORAL_TABLET | Freq: Every day | ORAL | Status: DC
  Administered 2019-12-11: 175 ug via ORAL
  Filled 2019-12-10: qty 4

## 2019-12-10 MED ORDER — PREDNISONE 10 MG PO TABS
10.0000 mg | ORAL_TABLET | Freq: Every day | ORAL | Status: DC
  Administered 2019-12-11: 10 mg via ORAL
  Filled 2019-12-10: qty 1

## 2019-12-10 MED ORDER — POLYVINYL ALCOHOL 1.4 % OP SOLN: 2.0000 [drp] | Freq: Every day | OPHTHALMIC | Status: DC | PRN

## 2019-12-10 MED ORDER — DILTIAZEM HCL ER COATED BEADS 240 MG PO CP24
240.0000 mg | ORAL_CAPSULE | Freq: Every day | ORAL | Status: DC
  Administered 2019-12-11: 240 mg via ORAL
  Filled 2019-12-10: qty 1

## 2019-12-10 MED ORDER — TIOTROPIUM BROMIDE MONOHYDRATE 2.5 MCG/ACT IN AERS: 2.0000 | INHALATION_SPRAY | Freq: Every day | RESPIRATORY_TRACT | Status: DC

## 2019-12-10 MED ORDER — PANTOPRAZOLE SODIUM 40 MG PO TBEC
40.0000 mg | DELAYED_RELEASE_TABLET | Freq: Two times a day (BID) | ORAL | Status: DC
  Administered 2019-12-11: 40 mg via ORAL
  Filled 2019-12-10: qty 1

## 2019-12-10 MED ORDER — UMECLIDINIUM BROMIDE 62.5 MCG/INH IN AEPB
1.0000 | INHALATION_SPRAY | Freq: Every day | RESPIRATORY_TRACT | Status: DC
  Administered 2019-12-11: 1 via RESPIRATORY_TRACT
  Filled 2019-12-10: qty 7

## 2019-12-10 MED ORDER — SERTRALINE HCL 50 MG PO TABS
50.0000 mg | ORAL_TABLET | Freq: Every day | ORAL | Status: DC
  Administered 2019-12-10: 50 mg via ORAL
  Filled 2019-12-10: qty 1

## 2019-12-10 MED ORDER — CARBOXYMETHYLCELLULOSE SOD PF 0.5 % OP SOLN: 2.0000 [drp] | Freq: Every day | OPHTHALMIC | Status: DC | PRN

## 2019-12-10 MED ORDER — INSULIN ASPART 100 UNIT/ML ~~LOC~~ SOLN: 0.0000 [IU] | Freq: Every day | SUBCUTANEOUS | Status: DC

## 2019-12-10 MED ORDER — APIXABAN 5 MG PO TABS
5.0000 mg | ORAL_TABLET | Freq: Two times a day (BID) | ORAL | Status: DC
  Administered 2019-12-10 – 2019-12-11 (×2): 5 mg via ORAL
  Filled 2019-12-10 (×2): qty 1

## 2019-12-10 MED ORDER — MIRABEGRON ER 25 MG PO TB24
50.0000 mg | ORAL_TABLET | Freq: Every day | ORAL | Status: DC
  Administered 2019-12-11: 50 mg via ORAL
  Filled 2019-12-10 (×2): qty 1
  Filled 2019-12-10: qty 2
  Filled 2019-12-10: qty 1

## 2019-12-10 MED ORDER — ATORVASTATIN CALCIUM 40 MG PO TABS
80.0000 mg | ORAL_TABLET | Freq: Every day | ORAL | Status: DC
  Administered 2019-12-10: 80 mg via ORAL
  Filled 2019-12-10: qty 2
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
## 2019-12-10 NOTE — ED Triage Notes (Addendum)
Pt reports does not remember the last 24 hours. Pt reports intermittent diaphoretic, pt reports "I dont feel right." pt husband brought pt to ED and reports LKW x2 days. Pt reports husband stated "you slept for 15 hours." pt reports I "dont remember going to bed."    Pt reports at the end of triage that "everything is blurry." pt now reports woke up at 1045 with blurred vision and that went to sleep last night around 730pm.

## 2019-12-10 NOTE — ED Provider Notes (Signed)
°  Physical Exam  BP 93/60    Pulse 92    Temp 98.3 F (36.8 C) (Oral)    Resp 20    Ht 5\' 3"  (1.6 m)    Wt 121.1 kg    SpO2 94%    BMI 47.30 kg/m   Physical Exam  ED Course/Procedures     Procedures  MDM  Patient presents with weakness and confusion.  Has been going for last couple days.  Confusion and did not remember the last 24 hours.  Reportedly slept for around 15 hours.  Upon arrival patient had been in A. fib and RVR that did resolve with some IV fluids.  Patient feeling somewhat better but still generally weak.  States she cannot walk like she used to.  However urine does not show infection.  Creatinine mildly elevated.  Could be component of dehydration but patient states that she drinks water all the time.  Also has known adrenal insufficiency and hypothyroidism.  Does not appear to been checked recently.  Potentially either could be low.  I think overall patient would benefit from admission the hospital for further evaluation and treatment.  Will discuss with hospitalist       Davonna Belling, MD 12/10/19 [phone]
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
## 2019-12-11 DIAGNOSIS — R531 Weakness: Secondary | ICD-10-CM | POA: Diagnosis not present

## 2019-12-11 DIAGNOSIS — R4182 Altered mental status, unspecified: Secondary | ICD-10-CM | POA: Diagnosis not present

## 2019-12-11 LAB — GLUCOSE, CAPILLARY: Glucose-Capillary: 106 mg/dL — ABNORMAL HIGH (ref 70–99)

## 2019-12-11 LAB — CBC
HCT: 32.8 % — ABNORMAL LOW (ref 36.0–46.0)
Hemoglobin: 9.4 g/dL — ABNORMAL LOW (ref 12.0–15.0)
MCH: 24 pg — ABNORMAL LOW (ref 26.0–34.0)
MCHC: 28.7 g/dL — ABNORMAL LOW (ref 30.0–36.0)
MCV: 83.9 fL (ref 80.0–100.0)
Platelets: 214 10*3/uL (ref 150–400)
RBC: 3.91 MIL/uL (ref 3.87–5.11)
RDW: 16.9 % — ABNORMAL HIGH (ref 11.5–15.5)
WBC: 7.5 10*3/uL (ref 4.0–10.5)
nRBC: 0 % (ref 0.0–0.2)

## 2019-12-11 LAB — BASIC METABOLIC PANEL
Anion gap: 8 (ref 5–15)
BUN: 16 mg/dL (ref 8–23)
CO2: 29 mmol/L (ref 22–32)
Calcium: 8.4 mg/dL — ABNORMAL LOW (ref 8.9–10.3)
Chloride: 105 mmol/L (ref 98–111)
Creatinine, Ser: 0.83 mg/dL (ref 0.44–1.00)
GFR calc Af Amer: >60 mL/min (ref >60)
GFR calc non Af Amer: >60 mL/min (ref >60)
Glucose, Bld: 118 mg/dL — ABNORMAL HIGH (ref 70–99)
Potassium: 3.8 mmol/L (ref 3.5–5.1)
Sodium: 142 mmol/L (ref 135–145)

## 2019-12-11 LAB — CORTISOL-AM, BLOOD: Cortisol - AM: 2.3 ug/dL — ABNORMAL LOW (ref 6.7–22.6)

## 2019-12-11 LAB — T4, FREE: Free T4: 1.35 ng/dL — ABNORMAL HIGH (ref 0.61–1.12)

## 2019-12-11 MED ORDER — ACETAMINOPHEN 325 MG PO TABS
650.0000 mg | ORAL_TABLET | Freq: Four times a day (QID) | ORAL | Status: DC | PRN
  Administered 2019-12-11: 650 mg via ORAL
  Filled 2019-12-11: qty 2

## 2019-12-11 MED ORDER — ACETAMINOPHEN 325 MG PO TABS: 650.0000 mg | ORAL_TABLET | Freq: Four times a day (QID) | ORAL | 0 refills | Status: AC | PRN

## 2019-12-11 NOTE — Discharge Instructions (Signed)
)  You are taking Apixaban/Eliquis so please  Avoid ibuprofen/Advil/Aleve/Motrin/Goody Powders/Naproxen/BC powders/Meloxicam/Diclofenac/Indomethacin and other Nonsteroidal anti-inflammatory medications as these will make you more likely to bleed and can cause stomach ulcers, can also cause Kidney problems.

## 2019-12-11 NOTE — Evaluation (Addendum)
Physical Therapy Evaluation Patient Details Name: Ann [name] MRN: [id number] DOB: 1946/01/06 Today's Date: 12/11/2019   History of Present Illness  Ann [name] is a 74 y.o. female with a history of paroxysmal atrial fibrillation, history of DVT on chronic anticoagulation, adrenal insufficiency on chronic steroids, hypothyroidism, COPD, type 2 diabetes on insulin, GERD, hypertension.  Patient seen for weakness over the past couple of days.  She has been more short of breath and weak recently.  Has difficulty ambulating.  No palliating or provoking factors.  Denies cough, chest pain, nausea, vomiting,     Clinical Impression  Patient on 2 L O2 via nasal cannula throughout session. Patient performed chair to [facility] transfer with modified independence. Required increased time, but did require some min guard assistance to take a few steps to the [facility] and did require assistance to wipe. Patient demonstrated fatigue following this and reported feeling tired so returned to bed. However, patient reported she feels comfortable to return home with family assistance. Do feel that patient may benefit from continued skilled physical therapy, however could also get ample assistance from family if available to help. Discussed patient's mobility with patient's MD. Recommend continued PT while in hospital for continued progress towards improving patinet's strength, endurance and overall functional mobility.    Follow Up Recommendations Home health PT;Supervision/Assistance - 24 hour;SNF    Equipment Recommendations  None recommended by PT    Recommendations for Other Services       Precautions / Restrictions Precautions Precautions: Fall Restrictions Weight Bearing Restrictions: No      Mobility  Bed Mobility Overal bed mobility: Modified Independent             General bed mobility comments: Increased time and use of bedrails HOB elevated  Transfers Overall transfer level: Modified  independent               General transfer comment: Stand pivot from the bed to the [facility] with CGA  Ambulation/Gait Ambulation/Gait assistance: Min guard Gait Distance (Feet): 2 Feet   Gait Pattern/deviations: Decreased stride length Gait velocity: Decreased short distance to commode      Stairs            Wheelchair Mobility    Modified Rankin (Stroke Patients Only)       Balance Overall balance assessment: Modified Independent;Needs assistance Sitting-balance support: Bilateral upper extremity supported Sitting balance-Leahy Scale: Good     Standing balance support: Bilateral upper extremity supported Standing balance-Leahy Scale: Fair Standing balance comment: Using bedrail and BSC handle                             Pertinent Vitals/Pain Pain Assessment: Faces Faces Pain Scale: Hurts a little bit Pain Location: Headache Pain Descriptors / Indicators: Aching Pain Intervention(s): Limited activity within patient's tolerance;Other (comment) (Patient reported RN has already given medication for this)    Home Living Family/patient expects to be discharged to:: Private residence Living Arrangements: Spouse/significant other;Children;Other relatives Available Help at Discharge: Family Type of Home: House Home Access: Ramped entrance     Home Layout: Two level;Able to live on main level with bedroom/bathroom Home Equipment: Gilford Rile - 2 wheels;Shower seat;Bedside commode;Hospital bed;Walker - 4 wheels;Walker - standard      Prior Function Level of Independence: Needs assistance   Gait / Transfers Assistance Needed: Patient reported she gets up and goes to the bathroom at home  ADL's / Homemaking Assistance Needed: Assisted by  family        Hand Dominance        Extremity/Trunk Assessment   Upper Extremity Assessment Upper Extremity Assessment: Generalized weakness    Lower Extremity Assessment Lower Extremity Assessment:  Generalized weakness    Cervical / Trunk Assessment Cervical / Trunk Assessment: Kyphotic  Communication   Communication: No difficulties  Cognition Arousal/Alertness: Awake/alert Behavior During Therapy: WFL for tasks assessed/performed Overall Cognitive Status: Within Functional Limits for tasks assessed                                        General Comments      Exercises     Assessment/Plan    PT Assessment Patient needs continued PT services  PT Problem List Decreased strength;Decreased mobility;Decreased activity tolerance;Decreased balance       PT Treatment Interventions DME instruction;Gait training;Functional mobility training;Therapeutic activities;Therapeutic exercise;Balance training;Neuromuscular re-education;Patient/family education    PT Goals (Current goals can be found in the Care Plan section)  Acute Rehab PT Goals Patient Stated Goal: To return home with family PT Goal Formulation: With patient Time For Goal Achievement: 12/18/19 Potential to Achieve Goals: Good    Frequency Min 3X/week   Barriers to discharge        Co-evaluation               AM-PAC PT "6 Clicks" Mobility  Outcome Measure Help needed turning from your back to your side while in a flat bed without using bedrails?: A Lot Help needed moving from lying on your back to sitting on the side of a flat bed without using bedrails?: A Lot Help needed moving to and from a bed to a chair (including a wheelchair)?: A Little Help needed standing up from a chair using your arms (e.g., wheelchair or bedside chair)?: A Little Help needed to walk in hospital room?: A Little Help needed climbing 3-5 steps with a railing? : A Little 6 Click Score: 16    End of Session   Activity Tolerance: Patient tolerated treatment well;Patient limited by fatigue Patient left: in bed (Per patient's request) Nurse Communication: Other (comment) (Notified MD of patient's mobility) PT  Visit Diagnosis: Unsteadiness on feet (R26.81);Other abnormalities of gait and mobility (R26.89);Muscle weakness (generalized) (M62.81)    Time: 4580-9983 PT Time Calculation (min) (ACUTE ONLY): 15 min   Charges:   PT Evaluation $PT Eval Low Complexity: 1 Low         Clarene Critchley PT, DPT 9:27 AM, 12/11/19 [phone]
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
## 2019-12-11 NOTE — Plan of Care (Signed)
  Problem: Acute Rehab PT Goals(only PT should resolve) Goal: Pt will Roll Supine to Side Outcome: Progressing Flowsheets (Taken 12/11/2019 0928) Pt will Roll Supine to Side: with modified independence Goal: Patient Will Perform Sitting Balance Outcome: Progressing Flowsheets (Taken 12/11/2019 0928) Patient will perform sitting balance: with modified independence Goal: Patient Will Transfer Sit To/From Stand Outcome: Progressing Flowsheets (Taken 12/11/2019 0928) Patient will transfer sit to/from stand: with modified independence Goal: Pt Will Transfer Bed To Chair/Chair To Bed Outcome: Progressing Flowsheets (Taken 12/11/2019 0928) Pt will Transfer Bed to Chair/Chair to Bed: with modified independence Goal: Pt Will Ambulate Outcome: Progressing Flowsheets (Taken 12/11/2019 0928) Pt will Ambulate:  15 feet  with least restrictive assistive device  Clarene Critchley PT, DPT 9:29 AM, 12/11/19 [phone]

## 2019-12-12 ENCOUNTER — Other Ambulatory Visit: Payer: Self-pay | Admitting: Family Medicine

## 2019-12-12 MED ORDER — HYDROCORTISONE 10 MG PO TABS: 10.0000 mg | ORAL_TABLET | Freq: Two times a day (BID) | ORAL | 2 refills | Status: DC

## 2019-12-12 NOTE — Progress Notes (Signed)
  Post discharge a.m. cortisol is found to be low at 2.3 -Adrenal insufficiency is probably contributing to patient's generalized weakness and soft BP  -Called and discussed finding with patient and patient from [place]  -As per patient's daughter Tara--okay to send prescription for hydrocortisone 10 mg twice daily to [facility] in [place] -Patient will follow up with PCP for recheck and reevaluation  Roxan Hockey, MD

## 2019-12-13 DIAGNOSIS — I4891 Unspecified atrial fibrillation: Secondary | ICD-10-CM | POA: Diagnosis not present

## 2019-12-13 DIAGNOSIS — J449 Chronic obstructive pulmonary disease, unspecified: Secondary | ICD-10-CM | POA: Diagnosis not present

## 2019-12-13 DIAGNOSIS — Z0001 Encounter for general adult medical examination with abnormal findings: Secondary | ICD-10-CM | POA: Diagnosis not present

## 2019-12-13 DIAGNOSIS — R531 Weakness: Secondary | ICD-10-CM | POA: Diagnosis not present

## 2019-12-13 DIAGNOSIS — Z79899 Other long term (current) drug therapy: Secondary | ICD-10-CM | POA: Diagnosis not present

## 2019-12-13 DIAGNOSIS — I509 Heart failure, unspecified: Secondary | ICD-10-CM | POA: Diagnosis not present

## 2019-12-13 DIAGNOSIS — E039 Hypothyroidism, unspecified: Secondary | ICD-10-CM | POA: Diagnosis not present

## 2019-12-13 DIAGNOSIS — I5030 Unspecified diastolic (congestive) heart failure: Secondary | ICD-10-CM | POA: Diagnosis not present

## 2019-12-13 DIAGNOSIS — E7800 Pure hypercholesterolemia, unspecified: Secondary | ICD-10-CM | POA: Diagnosis not present

## 2019-12-16 ENCOUNTER — Other Ambulatory Visit (HOSPITAL_COMMUNITY)
Admission: RE | Admit: 2019-12-16 | Discharge: 2019-12-16 | Disposition: A | Discharge status: Home / Self Care | Payer: Medicare Other | Source: Ambulatory Visit | Attending: Physician Assistant | Admitting: Physician Assistant

## 2019-12-16 ENCOUNTER — Other Ambulatory Visit: Payer: Self-pay

## 2019-12-16 ENCOUNTER — Ambulatory Visit (INDEPENDENT_AMBULATORY_CARE_PROVIDER_SITE_OTHER): Payer: Medicare Other | Admitting: Physician Assistant

## 2019-12-16 ENCOUNTER — Encounter: Payer: Self-pay | Admitting: Physician Assistant

## 2019-12-16 VITALS — BP 134/86 | HR 108 | Wt 274.0 lb

## 2019-12-16 DIAGNOSIS — I4819 Other persistent atrial fibrillation: Secondary | ICD-10-CM

## 2019-12-16 DIAGNOSIS — I2510 Atherosclerotic heart disease of native coronary artery without angina pectoris: Secondary | ICD-10-CM | POA: Diagnosis not present

## 2019-12-16 DIAGNOSIS — E2740 Unspecified adrenocortical insufficiency: Secondary | ICD-10-CM | POA: Insufficient documentation

## 2019-12-16 DIAGNOSIS — I5032 Chronic diastolic (congestive) heart failure: Secondary | ICD-10-CM

## 2019-12-16 DIAGNOSIS — I25119 Atherosclerotic heart disease of native coronary artery with unspecified angina pectoris: Secondary | ICD-10-CM | POA: Diagnosis not present

## 2019-12-16 DIAGNOSIS — J449 Chronic obstructive pulmonary disease, unspecified: Secondary | ICD-10-CM | POA: Diagnosis not present

## 2019-12-16 LAB — BASIC METABOLIC PANEL
Anion gap: 13 (ref 5–15)
BUN: 17 mg/dL (ref 8–23)
CO2: 26 mmol/L (ref 22–32)
Calcium: 9.2 mg/dL (ref 8.9–10.3)
Chloride: 101 mmol/L (ref 98–111)
Creatinine, Ser: 0.93 mg/dL (ref 0.44–1.00)
GFR calc Af Amer: >60 mL/min (ref >60)
GFR calc non Af Amer: >60 mL/min (ref >60)
Glucose, Bld: 90 mg/dL (ref 70–99)
Potassium: 4.4 mmol/L (ref 3.5–5.1)
Sodium: 140 mmol/L (ref 135–145)

## 2019-12-16 NOTE — Patient Instructions (Signed)
Medication Instructions:  Your physician recommends that you continue on your current medications as directed. Please refer to the Current Medication list given to you today.  Let our office know if you are going to continue Hydrocortisone.   *If you need a refill on your cardiac medications before your next appointment, please call your pharmacy*   Lab Work: Your physician recommends that you return for lab work in: Today Paramedic)   If you have labs (blood work) drawn today and your tests are completely normal, you will receive your results only by:  MyChart Message (if you have MyChart) OR  A paper copy in the mail If you have any lab test that is abnormal or we need to change your treatment, we will call you to review the results.   Testing/Procedures: NONE   Follow-Up: At [facility], you and your health needs are our priority.  As part of our continuing mission to provide you with exceptional heart care, we have created designated Provider Care Teams.  These Care Teams include your primary Cardiologist (physician) and Advanced Practice Providers (APPs -  Physician Assistants and Nurse Practitioners) who all work together to provide you with the care you need, when you need it.  We recommend signing up for the patient portal called "MyChart".  Sign up information is provided on this After Visit Summary.  MyChart is used to connect with patients for Virtual Visits (Telemedicine).  Patients are able to view lab/test results, encounter notes, upcoming appointments, etc.  Non-urgent messages can be sent to your provider as well.   To learn more about what you can do with MyChart, go to [email or web address].    Your next appointment:   4 week(s)  The format for your next appointment:   In Person  Provider:   Carlyle Dolly, MD or Bernerd Pho, PA-C   Other Instructions Thank you for choosing [facility]!  Limit liquid intake to 1-2 liters daily  Two Gram  Sodium Diet 2000 mg  What is Sodium? Sodium is a mineral found naturally in many foods. The most significant source of sodium in the diet is table salt, which is about 40% sodium.  Processed, convenience, and preserved foods also contain a large amount of sodium.  The body needs only 500 mg of sodium daily to function,  A normal diet provides more than enough sodium even if you do not use salt.  Why Limit Sodium? A build up of sodium in the body can cause thirst, increased blood pressure, shortness of breath, and water retention.  Decreasing sodium in the diet can reduce edema and risk of heart attack or stroke associated with high blood pressure.  Keep in mind that there are many other factors involved in these health problems.  Heredity, obesity, lack of exercise, cigarette smoking, stress and what you eat all play a role.  General Guidelines:  Do not add salt at the table or in cooking.  One teaspoon of salt contains over 2 grams of sodium.  Read food labels  Avoid processed and convenience foods  Ask your dietitian before eating any foods not dicussed in the menu planning guidelines  Consult your physician if you wish to use a salt substitute or a sodium containing medication such as antacids.  Limit milk and milk products to 16 oz (2 cups) per day.  Shopping Hints:  READ LABELS!! "Dietetic" does not necessarily mean low sodium.  Salt and other sodium ingredients are often added to foods during  processing.   Menu Planning Guidelines Food Group Choose More Often Avoid  Beverages (see also the milk group All fruit juices, low-sodium, salt-free vegetables juices, low-sodium carbonated beverages Regular vegetable or tomato juices, commercially softened water used for drinking or cooking  Breads and Cereals Enriched white, wheat, rye and pumpernickel bread, hard rolls and dinner rolls; muffins, cornbread and waffles; most dry cereals, cooked cereal without added salt; unsalted crackers and  breadsticks; low sodium or homemade bread crumbs Bread, rolls and crackers with salted tops; quick breads; instant hot cereals; pancakes; commercial bread stuffing; self-rising flower and biscuit mixes; regular bread crumbs or cracker crumbs  Desserts and Sweets Desserts and sweets mad with mild should be within allowance Instant pudding mixes and cake mixes  Fats Butter or margarine; vegetable oils; unsalted salad dressings, regular salad dressings limited to 1 Tbs; light, sour and heavy cream Regular salad dressings containing bacon fat, bacon bits, and salt pork; snack dips made with instant soup mixes or processed cheese; salted nuts  Fruits Most fresh, frozen and canned fruits Fruits processed with salt or sodium-containing ingredient (some dried fruits are processed with sodium sulfites        Vegetables Fresh, frozen vegetables and low- sodium canned vegetables Regular canned vegetables, sauerkraut, pickled vegetables, and others prepared in brine; frozen vegetables in sauces; vegetables seasoned with ham, bacon or salt pork  Condiments, Sauces, Miscellaneous  Salt substitute with physician's approval; pepper, herbs, spices; vinegar, lemon or lime juice; hot pepper sauce; garlic powder, onion powder, low sodium soy sauce (1 Tbs.); low sodium condiments (ketchup, chili sauce, mustard) in limited amounts (1 tsp.) fresh ground horseradish; unsalted tortilla chips, pretzels, potato chips, popcorn, salsa (1/4 cup) Any seasoning made with salt including garlic salt, celery salt, onion salt, and seasoned salt; sea salt, rock salt, kosher salt; meat tenderizers; monosodium glutamate; mustard, regular soy sauce, barbecue, sauce, chili sauce, teriyaki sauce, steak sauce, Worcestershire sauce, and most flavored vinegars; canned gravy and mixes; regular condiments; salted snack foods, olives, picles, relish, horseradish sauce, catsup   Food preparation: Try these seasonings Meats:    Pork Sage, onion  Serve with applesauce  Chicken Poultry seasoning, thyme, parsley Serve with cranberry sauce  Lamb Curry powder, rosemary, garlic, thyme Serve with mint sauce or jelly  Veal Marjoram, basil Serve with current jelly, cranberry sauce  Beef Pepper, bay leaf Serve with dry mustard, unsalted chive butter  Fish [place] leaf, dill Serve with unsalted lemon butter, unsalted parsley butter  Vegetables:    Asparagus Lemon juice   Broccoli Lemon juice   Carrots Mustard dressing parsley, mint, nutmeg, glazed with unsalted butter and sugar   Green beans Marjoram, lemon juice, nutmeg,dill seed   Tomatoes Basil, marjoram, onion   Spice /blend for [employer]" 4 tsp ground thyme 1 tsp ground sage 3 tsp ground rosemary 4 tsp ground marjoram   Test your knowledge 1. A product that says "Salt Free" may still contain sodium. True or False 2. Garlic Powder and Hot Pepper Sauce an be used as alternative seasonings.True or False 3. Processed foods have more sodium than fresh foods.  True or False 4. Canned Vegetables have less sodium than froze True or False  WAYS TO DECREASE YOUR SODIUM INTAKE 1. Avoid the use of added salt in cooking and at the table.  Table salt (and other prepared seasonings which contain salt) is probably one of the greatest sources of sodium in the diet.  Unsalted foods can gain flavor from the sweet, sour,  and butter taste sensations of herbs and spices.  Instead of using salt for seasoning, try the following seasonings with the foods listed.  Remember: how you use them to enhance natural food flavors is limited only by your creativity... Allspice-Meat, fish, eggs, fruit, peas, red and yellow vegetables Almond Extract-Fruit baked goods Anise Seed-Sweet breads, fruit, carrots, beets, cottage cheese, cookies (tastes like licorice) Basil-Meat, fish, eggs, vegetables, rice, vegetables salads, soups, sauces Bay Leaf-Meat, fish, stews, poultry Burnet-Salad, vegetables (cucumber-like  flavor) Caraway Seed-Bread, cookies, cottage cheese, meat, vegetables, cheese, rice Cardamon-Baked goods, fruit, soups Celery Powder or seed-Salads, salad dressings, sauces, meatloaf, soup, bread.Do not use  celery salt Chervil-Meats, salads, fish, eggs, vegetables, cottage cheese (parsley-like flavor) Chili Power-Meatloaf, chicken cheese, corn, eggplant, egg dishes Chives-Salads cottage cheese, egg dishes, soups, vegetables, sauces Cilantro-Salsa, casseroles Cinnamon-Baked goods, fruit, pork, lamb, chicken, carrots Cloves-Fruit, baked goods, fish, pot roast, green beans, beets, carrots Coriander-Pastry, cookies, meat, salads, cheese (lemon-orange flavor) Cumin-Meatloaf, fish,cheese, eggs, cabbage,fruit pie (caraway flavor) [employer], fruit, eggs, fish, poultry, cottage cheese, vegetables Dill Seed-Meat, cottage cheese, poultry, vegetables, fish, salads, bread Fennel Seed-Bread, cookies, apples, pork, eggs, fish, beets, cabbage, cheese, Licorice-like flavor Garlic-(buds or powder) Salads, meat, poultry, fish, bread, butter, vegetables, potatoes.Do not  use garlic salt Ginger-Fruit, vegetables, baked goods, meat, fish, poultry Horseradish Root-Meet, vegetables, butter Lemon Juice or Extract-Vegetables, fruit, tea, baked goods, fish salads Mace-Baked goods fruit, vegetables, fish, poultry (taste like nutmeg) Maple Extract-Syrups Marjoram-Meat, chicken, fish, vegetables, breads, green salads (taste like Sage) Mint-Tea, lamb, sherbet, vegetables, desserts, carrots, cabbage Mustard, Dry or Seed-Cheese, eggs, meats, vegetables, poultry Nutmeg-Baked goods, fruit, chicken, eggs, vegetables, desserts Onion Powder-Meat, fish, poultry, vegetables, cheese, eggs, bread, rice salads (Do not use   Onion salt) Orange Extract-Desserts, baked goods Oregano-Pasta, eggs, cheese, onions, pork, lamb, fish, chicken, vegetables, green salads Paprika-Meat, fish, poultry, eggs, cheese, vegetables Parsley  Flakes-Butter, vegetables, meat fish, poultry, eggs, bread, salads (certain forms may   Contain sodium Pepper-Meat fish, poultry, vegetables, eggs Peppermint Extract-Desserts, baked goods Poppy Seed-Eggs, bread, cheese, fruit dressings, baked goods, noodles, vegetables, cottage  [employer], poultry, meat, fish, cauliflower, turnips,eggs bread Saffron-Rice, bread, veal, chicken, fish, eggs Sage-Meat, fish, poultry, onions, eggplant, tomateos, pork, stews Savory-Eggs, salads, poultry, meat, rice, vegetables, soups, pork Tarragon-Meat, poultry, fish, eggs, butter, vegetables (licorice-like flavor)  Thyme-Meat, poultry, fish, eggs, vegetables, (clover-like flavor), sauces, soups Tumeric-Salads, butter, eggs, fish, rice, vegetables (saffron-like flavor) Vanilla Extract-Baked goods, candy Vinegar-Salads, vegetables, meat marinades Walnut Extract-baked goods, candy  2. Choose your Foods Wisely   The following is a list of foods to avoid which are high in sodium:  Meats-Avoid all smoked, canned, salt cured, dried and kosher meat and fish as well as Anchovies   [employer] meats:Bologna, Liverwurst, Pastrami Canned meat or fish  Marinated herring Caviar    Pepperoni Corned Beef   Pizza Dried chipped beef  Salami Frozen breaded fish or meat Salt pork Frankfurters or hot dogs  Sardines Gefilte fish   Sausage Ham (boiled ham, Proscuitto Smoked butt    spiced ham)   Spam      TV Dinners Vegetables Canned vegetables (Regular) Relish Canned mushrooms  Sauerkraut Olives    Tomato juice Pickles  Bakery and Dessert Products Canned puddings  Cream pies Cheesecake   Decorated cakes Cookies  Beverages/Juices Tomato juice, regular  Gatorade   V-8 vegetable juice, regular  Breads and Cereals Biscuit mixes   Salted potato chips, corn chips, pretzels Bread stuffing mixes  Salted crackers and rolls Pancake and waffle mixes Self-rising  flour  Seasonings Accent    Meat sauces Barbecue sauce  Meat tenderizer Catsup    Monosodium glutamate (MSG) Celery salt   Onion salt Chili sauce   Prepared mustard Garlic salt   Salt, seasoned salt, sea salt Gravy mixes   Soy sauce Horseradish   Steak sauce Ketchup   Tartar sauce Lite salt    Teriyaki sauce Marinade mixes   Worcestershire sauce  Others Baking powder   Cocoa and cocoa mixes Baking soda   Commercial casserole mixes Candy-caramels, chocolate  Dehydrated soups    Bars, fudge,nougats  Instant rice and pasta mixes Canned broth or soup  Maraschino cherries Cheese, aged and processed cheese and cheese spreads  Learning Assessment Quiz  Indicated T (for True) or F (for False) for each of the following statements:  1. _____ Fresh fruits and vegetables and unprocessed grains are generally low in sodium 2. _____ Water may contain a considerable amount of sodium, depending on the source 3. _____ You can always tell if a food is high in sodium by tasting it 4. _____ Certain laxatives my be high in sodium and should be avoided unless prescribed   by a physician or pharmacist 5. _____ Salt substitutes may be used freely by anyone on a sodium restricted diet 6. _____ Sodium is present in table salt, food additives and as a natural component of   most foods 7. _____ Table salt is approximately 90% sodium 8. _____ Limiting sodium intake may help prevent excess fluid accumulation in the body 9. _____ On a sodium-restricted diet, seasonings such as bouillon soy sauce, and    cooking wine should be used in place of table salt 10. _____ On an ingredient list, a product which lists monosodium glutamate as the first   ingredient is an appropriate food to include on a low sodium diet  Circle the best answer(s) to the following statements (Hint: there may be more than one correct answer)  11. On a low-sodium diet, some acceptable snack items are:    A. Olives  F. Bean dip   K.  Grapefruit juice    B. Salted Pretzels G. Commercial Popcorn   L. Canned peaches    C. Carrot Sticks  H. Bouillon   M. Unsalted nuts   D. [place] fries  I. Peanut butter crackers N. Salami   E. Sweet pickles J. Tomato Juice   O. Pizza  12.  Seasonings that may be used freely on a reduced - sodium diet include   A. Lemon wedges F.Monosodium glutamate K. Celery seed    B.Soysauce   G. Pepper   L. Mustard powder   C. Sea salt  H. Cooking wine  M. Onion flakes   D. Vinegar  E. Prepared horseradish N. Salsa   E. Sage   J. Worcestershire sauce  O. Chutney
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
## 2019-12-17 ENCOUNTER — Telehealth: Payer: Self-pay | Admitting: Physician Assistant

## 2019-12-17 ENCOUNTER — Ambulatory Visit: Payer: Medicare Other | Admitting: "Endocrinology

## 2019-12-17 NOTE — Telephone Encounter (Signed)
Returned call to pt. No answer. Left msg to call back.  

## 2019-12-17 NOTE — Telephone Encounter (Signed)
Patient returning call to nurse from earlier

## 2019-12-17 NOTE — Telephone Encounter (Signed)
New message     Patient was suppose to call back and let Suanne Marker know if she was taking   hydrocortisone (CORTEF) 10 MG tablet Take 1 tablet (10 mg total) by mouth 2 (two) times daily.   ALPRAZolam (XANAX) 0.5 MG tablet Take 0.5 mg by mouth 2 (two) times daily as needed for anxiety or sleep.    She is not taking either of these drugs.  Please call and leave a message on her voicemail on how she is to proceed.   Thank you

## 2019-12-17 NOTE — Telephone Encounter (Signed)
Please let her know that it is very important she keep her appt w/ Dr Dorris Fetch on Monday. Make him aware she is not on the hydrocortisone. Thanks

## 2019-12-17 NOTE — Telephone Encounter (Signed)
Pt notified and voiced understanding 

## 2019-12-20 ENCOUNTER — Ambulatory Visit (INDEPENDENT_AMBULATORY_CARE_PROVIDER_SITE_OTHER): Payer: Medicare Other | Admitting: "Endocrinology

## 2019-12-20 ENCOUNTER — Encounter: Payer: Self-pay | Admitting: "Endocrinology

## 2019-12-20 ENCOUNTER — Other Ambulatory Visit: Payer: Self-pay

## 2019-12-20 VITALS — BP 148/92 | HR 80 | Ht 63.0 in | Wt 274.0 lb

## 2019-12-20 DIAGNOSIS — E1165 Type 2 diabetes mellitus with hyperglycemia: Secondary | ICD-10-CM

## 2019-12-20 DIAGNOSIS — E119 Type 2 diabetes mellitus without complications: Secondary | ICD-10-CM

## 2019-12-20 DIAGNOSIS — I25119 Atherosclerotic heart disease of native coronary artery with unspecified angina pectoris: Secondary | ICD-10-CM | POA: Diagnosis not present

## 2019-12-20 DIAGNOSIS — E2740 Unspecified adrenocortical insufficiency: Secondary | ICD-10-CM | POA: Diagnosis not present

## 2019-12-20 DIAGNOSIS — E039 Hypothyroidism, unspecified: Secondary | ICD-10-CM | POA: Diagnosis not present

## 2019-12-20 MED ORDER — PREDNISONE 10 MG PO TABS: 10.0000 mg | ORAL_TABLET | Freq: Every day | ORAL | 1 refills | Status: DC

## 2019-12-20 MED ORDER — LEVOTHYROXINE SODIUM 150 MCG PO TABS: 150.0000 ug | ORAL_TABLET | Freq: Every day | ORAL | 1 refills | Status: DC

## 2019-12-20 MED ORDER — PREDNISONE 10 MG PO TABS: 10.0000 mg | ORAL_TABLET | Freq: Every day | ORAL | 0 refills | Status: DC
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
## 2019-12-24 ENCOUNTER — Other Ambulatory Visit: Payer: Self-pay

## 2019-12-24 MED ORDER — PEN NEEDLES 31G X 5 MM MISC: 1.0000 | Freq: Two times a day (BID) | 3 refills | Status: DC

## 2019-12-27 DIAGNOSIS — H40033 Anatomical narrow angle, bilateral: Secondary | ICD-10-CM | POA: Diagnosis not present

## 2019-12-27 DIAGNOSIS — H2513 Age-related nuclear cataract, bilateral: Secondary | ICD-10-CM | POA: Diagnosis not present

## 2020-01-06 DIAGNOSIS — E039 Hypothyroidism, unspecified: Secondary | ICD-10-CM | POA: Diagnosis not present

## 2020-01-06 DIAGNOSIS — E1165 Type 2 diabetes mellitus with hyperglycemia: Secondary | ICD-10-CM | POA: Diagnosis not present

## 2020-01-06 LAB — COMPLETE METABOLIC PANEL WITH GFR
AG Ratio: 1.7 (calc) (ref 1.0–2.5)
ALT: 16 U/L (ref 6–29)
AST: 17 U/L (ref 10–35)
Albumin: 4.2 g/dL (ref 3.6–5.1)
Alkaline phosphatase (APISO): 91 U/L (ref 37–153)
BUN/Creatinine Ratio: 21 (calc) (ref 6–22)
BUN: 38 mg/dL — ABNORMAL HIGH (ref 7–25)
CO2: 26 mmol/L (ref 20–32)
Calcium: 9.5 mg/dL (ref 8.6–10.4)
Chloride: 98 mmol/L (ref 98–110)
Creat: 1.81 mg/dL — ABNORMAL HIGH (ref 0.60–0.93)
GFR, Est African American: 31 mL/min/{1.73_m2} — ABNORMAL LOW (ref >=60)
GFR, Est Non African American: 27 mL/min/{1.73_m2} — ABNORMAL LOW (ref >=60)
Globulin: 2.5 g/dL (calc) (ref 1.9–3.7)
Glucose, Bld: 196 mg/dL — ABNORMAL HIGH (ref 65–99)
Potassium: 5 mmol/L (ref 3.5–5.3)
Sodium: 139 mmol/L (ref 135–146)
Total Bilirubin: 0.4 mg/dL (ref 0.2–1.2)
Total Protein: 6.7 g/dL (ref 6.1–8.1)

## 2020-01-06 LAB — TSH: TSH: 1.22 mIU/L (ref 0.40–4.50)

## 2020-01-06 LAB — T4, FREE: Free T4: 1.5 ng/dL (ref 0.8–1.8)

## 2020-01-10 ENCOUNTER — Telehealth: Payer: Self-pay | Admitting: "Endocrinology

## 2020-01-10 NOTE — Telephone Encounter (Signed)
Discussed pt's lab results and advised her to discontinue her metformin per Dr.Nida's orders. Understanding voiced per pt.

## 2020-01-10 NOTE — Telephone Encounter (Signed)
Pt is returning a call to [employer]

## 2020-01-13 ENCOUNTER — Telehealth (INDEPENDENT_AMBULATORY_CARE_PROVIDER_SITE_OTHER): Payer: Medicare Other | Admitting: "Endocrinology

## 2020-01-13 ENCOUNTER — Encounter: Payer: Self-pay | Admitting: "Endocrinology

## 2020-01-13 ENCOUNTER — Ambulatory Visit: Payer: Medicare Other | Admitting: "Endocrinology

## 2020-01-13 VITALS — Ht 63.0 in

## 2020-01-13 DIAGNOSIS — F3340 Major depressive disorder, recurrent, in remission, unspecified: Secondary | ICD-10-CM | POA: Diagnosis not present

## 2020-01-13 DIAGNOSIS — I10 Essential (primary) hypertension: Secondary | ICD-10-CM | POA: Diagnosis not present

## 2020-01-13 DIAGNOSIS — E039 Hypothyroidism, unspecified: Secondary | ICD-10-CM

## 2020-01-13 DIAGNOSIS — E119 Type 2 diabetes mellitus without complications: Secondary | ICD-10-CM | POA: Diagnosis not present

## 2020-01-13 DIAGNOSIS — E2740 Unspecified adrenocortical insufficiency: Secondary | ICD-10-CM | POA: Diagnosis not present

## 2020-01-13 DIAGNOSIS — I25119 Atherosclerotic heart disease of native coronary artery with unspecified angina pectoris: Secondary | ICD-10-CM | POA: Diagnosis not present

## 2020-01-13 DIAGNOSIS — E782 Mixed hyperlipidemia: Secondary | ICD-10-CM | POA: Diagnosis not present

## 2020-01-13 DIAGNOSIS — E1165 Type 2 diabetes mellitus with hyperglycemia: Secondary | ICD-10-CM

## 2020-01-13 DIAGNOSIS — J449 Chronic obstructive pulmonary disease, unspecified: Secondary | ICD-10-CM | POA: Diagnosis not present

## 2020-01-13 MED ORDER — LEVEMIR FLEXTOUCH 100 UNIT/ML ~~LOC~~ SOPN: 30.0000 [IU] | PEN_INJECTOR | Freq: Two times a day (BID) | SUBCUTANEOUS | 2 refills | Status: DC
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
## 2020-01-13 NOTE — Patient Instructions (Signed)

## 2020-01-14 ENCOUNTER — Ambulatory Visit: Payer: Medicare Other | Admitting: "Endocrinology

## 2020-01-23 ENCOUNTER — Emergency Department (HOSPITAL_COMMUNITY): Payer: Medicare Other

## 2020-01-23 ENCOUNTER — Other Ambulatory Visit: Payer: Self-pay

## 2020-01-23 ENCOUNTER — Inpatient Hospital Stay (HOSPITAL_COMMUNITY)
Admission: EM | Admit: 2020-01-23 | Discharge: 2020-01-26 | DRG: 291 | Disposition: A | Discharge status: Home / Self Care | Payer: Medicare Other | Attending: Internal Medicine | Admitting: Internal Medicine

## 2020-01-23 ENCOUNTER — Encounter (HOSPITAL_COMMUNITY): Payer: Self-pay | Admitting: Emergency Medicine

## 2020-01-23 DIAGNOSIS — I2510 Atherosclerotic heart disease of native coronary artery without angina pectoris: Secondary | ICD-10-CM | POA: Diagnosis present

## 2020-01-23 DIAGNOSIS — K219 Gastro-esophageal reflux disease without esophagitis: Secondary | ICD-10-CM | POA: Diagnosis present

## 2020-01-23 DIAGNOSIS — I480 Paroxysmal atrial fibrillation: Secondary | ICD-10-CM

## 2020-01-23 DIAGNOSIS — M1990 Unspecified osteoarthritis, unspecified site: Secondary | ICD-10-CM | POA: Diagnosis present

## 2020-01-23 DIAGNOSIS — D509 Iron deficiency anemia, unspecified: Secondary | ICD-10-CM | POA: Diagnosis present

## 2020-01-23 DIAGNOSIS — I5033 Acute on chronic diastolic (congestive) heart failure: Secondary | ICD-10-CM | POA: Diagnosis not present

## 2020-01-23 DIAGNOSIS — E538 Deficiency of other specified B group vitamins: Secondary | ICD-10-CM | POA: Diagnosis present

## 2020-01-23 DIAGNOSIS — E1165 Type 2 diabetes mellitus with hyperglycemia: Secondary | ICD-10-CM

## 2020-01-23 DIAGNOSIS — J449 Chronic obstructive pulmonary disease, unspecified: Secondary | ICD-10-CM | POA: Diagnosis present

## 2020-01-23 DIAGNOSIS — Z7901 Long term (current) use of anticoagulants: Secondary | ICD-10-CM

## 2020-01-23 DIAGNOSIS — I130 Hypertensive heart and chronic kidney disease with heart failure and stage 1 through stage 4 chronic kidney disease, or unspecified chronic kidney disease: Secondary | ICD-10-CM | POA: Diagnosis not present

## 2020-01-23 DIAGNOSIS — E782 Mixed hyperlipidemia: Secondary | ICD-10-CM | POA: Diagnosis present

## 2020-01-23 DIAGNOSIS — Z20822 Contact with and (suspected) exposure to covid-19: Secondary | ICD-10-CM | POA: Diagnosis not present

## 2020-01-23 DIAGNOSIS — Z79890 Hormone replacement therapy (postmenopausal): Secondary | ICD-10-CM

## 2020-01-23 DIAGNOSIS — E662 Morbid (severe) obesity with alveolar hypoventilation: Secondary | ICD-10-CM | POA: Diagnosis not present

## 2020-01-23 DIAGNOSIS — I4891 Unspecified atrial fibrillation: Secondary | ICD-10-CM | POA: Diagnosis present

## 2020-01-23 DIAGNOSIS — Z79899 Other long term (current) drug therapy: Secondary | ICD-10-CM

## 2020-01-23 DIAGNOSIS — R0602 Shortness of breath: Secondary | ICD-10-CM | POA: Diagnosis not present

## 2020-01-23 DIAGNOSIS — R0902 Hypoxemia: Secondary | ICD-10-CM | POA: Diagnosis not present

## 2020-01-23 DIAGNOSIS — M79651 Pain in right thigh: Secondary | ICD-10-CM | POA: Diagnosis present

## 2020-01-23 DIAGNOSIS — Z794 Long term (current) use of insulin: Secondary | ICD-10-CM

## 2020-01-23 DIAGNOSIS — Z86718 Personal history of other venous thrombosis and embolism: Secondary | ICD-10-CM

## 2020-01-23 DIAGNOSIS — J9601 Acute respiratory failure with hypoxia: Secondary | ICD-10-CM | POA: Diagnosis not present

## 2020-01-23 DIAGNOSIS — E119 Type 2 diabetes mellitus without complications: Secondary | ICD-10-CM

## 2020-01-23 DIAGNOSIS — E039 Hypothyroidism, unspecified: Secondary | ICD-10-CM | POA: Diagnosis present

## 2020-01-23 DIAGNOSIS — Z87891 Personal history of nicotine dependence: Secondary | ICD-10-CM

## 2020-01-23 DIAGNOSIS — Z981 Arthrodesis status: Secondary | ICD-10-CM

## 2020-01-23 DIAGNOSIS — E1143 Type 2 diabetes mellitus with diabetic autonomic (poly)neuropathy: Secondary | ICD-10-CM | POA: Diagnosis present

## 2020-01-23 DIAGNOSIS — F419 Anxiety disorder, unspecified: Secondary | ICD-10-CM | POA: Diagnosis present

## 2020-01-23 DIAGNOSIS — Z8249 Family history of ischemic heart disease and other diseases of the circulatory system: Secondary | ICD-10-CM

## 2020-01-23 DIAGNOSIS — Z6841 Body Mass Index (BMI) 40.0 and over, adult: Secondary | ICD-10-CM

## 2020-01-23 DIAGNOSIS — Z7952 Long term (current) use of systemic steroids: Secondary | ICD-10-CM

## 2020-01-23 DIAGNOSIS — Z955 Presence of coronary angioplasty implant and graft: Secondary | ICD-10-CM

## 2020-01-23 DIAGNOSIS — N1831 Chronic kidney disease, stage 3a: Secondary | ICD-10-CM | POA: Diagnosis present

## 2020-01-23 DIAGNOSIS — I4819 Other persistent atrial fibrillation: Secondary | ICD-10-CM | POA: Diagnosis not present

## 2020-01-23 DIAGNOSIS — E2740 Unspecified adrenocortical insufficiency: Secondary | ICD-10-CM | POA: Diagnosis present

## 2020-01-23 DIAGNOSIS — K3184 Gastroparesis: Secondary | ICD-10-CM | POA: Diagnosis present

## 2020-01-23 DIAGNOSIS — H409 Unspecified glaucoma: Secondary | ICD-10-CM | POA: Diagnosis present

## 2020-01-23 DIAGNOSIS — E1122 Type 2 diabetes mellitus with diabetic chronic kidney disease: Secondary | ICD-10-CM | POA: Diagnosis present

## 2020-01-23 DIAGNOSIS — N183 Chronic kidney disease, stage 3 unspecified: Secondary | ICD-10-CM | POA: Diagnosis present

## 2020-01-23 DIAGNOSIS — D500 Iron deficiency anemia secondary to blood loss (chronic): Secondary | ICD-10-CM | POA: Diagnosis present

## 2020-01-23 LAB — BASIC METABOLIC PANEL
Anion gap: 12 (ref 5–15)
BUN: 19 mg/dL (ref 8–23)
CO2: 28 mmol/L (ref 22–32)
Calcium: 9 mg/dL (ref 8.9–10.3)
Chloride: 99 mmol/L (ref 98–111)
Creatinine, Ser: 1.11 mg/dL — ABNORMAL HIGH (ref 0.44–1.00)
GFR calc Af Amer: 57 mL/min — ABNORMAL LOW (ref >60)
GFR calc non Af Amer: 49 mL/min — ABNORMAL LOW (ref >60)
Glucose, Bld: 116 mg/dL — ABNORMAL HIGH (ref 70–99)
Potassium: 4 mmol/L (ref 3.5–5.1)
Sodium: 139 mmol/L (ref 135–145)

## 2020-01-23 LAB — URINALYSIS, ROUTINE W REFLEX MICROSCOPIC
Bilirubin Urine: NEGATIVE
Glucose, UA: NEGATIVE mg/dL
Hgb urine dipstick: NEGATIVE
Ketones, ur: NEGATIVE mg/dL
Leukocytes,Ua: NEGATIVE
Nitrite: NEGATIVE
Protein, ur: NEGATIVE mg/dL
Specific Gravity, Urine: 1.012 (ref 1.005–1.030)
pH: 5 (ref 5.0–8.0)

## 2020-01-23 LAB — MAGNESIUM: Magnesium: 2 mg/dL (ref 1.7–2.4)

## 2020-01-23 LAB — CBC
HCT: 37.2 % (ref 36.0–46.0)
Hemoglobin: 10.7 g/dL — ABNORMAL LOW (ref 12.0–15.0)
MCH: 23.8 pg — ABNORMAL LOW (ref 26.0–34.0)
MCHC: 28.8 g/dL — ABNORMAL LOW (ref 30.0–36.0)
MCV: 82.9 fL (ref 80.0–100.0)
Platelets: 300 10*3/uL (ref 150–400)
RBC: 4.49 MIL/uL (ref 3.87–5.11)
RDW: 17.2 % — ABNORMAL HIGH (ref 11.5–15.5)
WBC: 8 10*3/uL (ref 4.0–10.5)
nRBC: 0 % (ref 0.0–0.2)

## 2020-01-23 LAB — PROTIME-INR
INR: 1.3 — ABNORMAL HIGH (ref 0.8–1.2)
Prothrombin Time: 15.3 seconds — ABNORMAL HIGH (ref 11.4–15.2)

## 2020-01-23 LAB — TROPONIN I (HIGH SENSITIVITY)
Troponin I (High Sensitivity): 25 ng/L — ABNORMAL HIGH (ref <18)
Troponin I (High Sensitivity): 28 ng/L — ABNORMAL HIGH (ref <18)

## 2020-01-23 LAB — BRAIN NATRIURETIC PEPTIDE: B Natriuretic Peptide: 330 pg/mL — ABNORMAL HIGH (ref 0.0–100.0)

## 2020-01-23 LAB — SARS CORONAVIRUS 2 BY RT PCR (HOSPITAL ORDER, PERFORMED IN ~~LOC~~ HOSPITAL LAB): SARS Coronavirus 2: NEGATIVE

## 2020-01-23 MED ORDER — ONDANSETRON HCL 4 MG/2ML IJ SOLN
4.0000 mg | Freq: Once | INTRAMUSCULAR | Status: AC
  Administered 2020-01-23: 4 mg via INTRAVENOUS
  Filled 2020-01-23: qty 2

## 2020-01-23 MED ORDER — FUROSEMIDE 10 MG/ML IJ SOLN
40.0000 mg | Freq: Once | INTRAMUSCULAR | Status: AC
  Administered 2020-01-24: 40 mg via INTRAVENOUS
  Filled 2020-01-23: qty 4

## 2020-01-23 MED ORDER — ACETAMINOPHEN 325 MG PO TABS: 650.0000 mg | ORAL_TABLET | Freq: Four times a day (QID) | ORAL | Status: DC | PRN

## 2020-01-23 MED ORDER — SODIUM CHLORIDE 0.9 % IV SOLN: INTRAVENOUS | Status: DC

## 2020-01-23 MED ORDER — DILTIAZEM HCL-DEXTROSE 125-5 MG/125ML-% IV SOLN (PREMIX)
5.0000 mg/h | INTRAVENOUS | Status: DC
  Administered 2020-01-23: 5 mg/h via INTRAVENOUS
  Administered 2020-01-24: 7.5 mg/h via INTRAVENOUS
  Filled 2020-01-23 (×2): qty 125

## 2020-01-23 MED ORDER — PROCHLORPERAZINE EDISYLATE 10 MG/2ML IJ SOLN
5.0000 mg | INTRAMUSCULAR | Status: DC | PRN
  Administered 2020-01-26: 5 mg via INTRAVENOUS
  Filled 2020-01-23: qty 2

## 2020-01-23 MED ORDER — ACETAMINOPHEN 650 MG RE SUPP: 650.0000 mg | Freq: Four times a day (QID) | RECTAL | Status: DC | PRN

## 2020-01-23 MED ORDER — FUROSEMIDE 10 MG/ML IJ SOLN
40.0000 mg | Freq: Two times a day (BID) | INTRAMUSCULAR | Status: DC
  Administered 2020-01-24 – 2020-01-25 (×4): 40 mg via INTRAVENOUS
  Filled 2020-01-23 (×4): qty 4

## 2020-01-23 MED ORDER — MAGNESIUM SULFATE 2 GM/50ML IV SOLN
2.0000 g | Freq: Once | INTRAVENOUS | Status: AC
  Administered 2020-01-24: 2 g via INTRAVENOUS
  Filled 2020-01-23: qty 50

## 2020-01-23 MED ORDER — POTASSIUM CHLORIDE CRYS ER 20 MEQ PO TBCR
40.0000 meq | EXTENDED_RELEASE_TABLET | Freq: Once | ORAL | Status: AC
  Administered 2020-01-24: 40 meq via ORAL
  Filled 2020-01-23: qty 2
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
## 2020-01-23 NOTE — ED Triage Notes (Signed)
Weakness and SOB x 3 days   Pulse ox in triage 85 per cent on room air   Pt reports she has had her covid shots
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
## 2020-01-23 NOTE — ED Triage Notes (Signed)
Moved to rm 1

## 2020-01-24 ENCOUNTER — Inpatient Hospital Stay (HOSPITAL_COMMUNITY): Payer: Medicare Other

## 2020-01-24 DIAGNOSIS — E039 Hypothyroidism, unspecified: Secondary | ICD-10-CM | POA: Diagnosis present

## 2020-01-24 DIAGNOSIS — I5033 Acute on chronic diastolic (congestive) heart failure: Secondary | ICD-10-CM | POA: Diagnosis present

## 2020-01-24 DIAGNOSIS — Z794 Long term (current) use of insulin: Secondary | ICD-10-CM | POA: Diagnosis not present

## 2020-01-24 DIAGNOSIS — J449 Chronic obstructive pulmonary disease, unspecified: Secondary | ICD-10-CM | POA: Diagnosis present

## 2020-01-24 DIAGNOSIS — E2740 Unspecified adrenocortical insufficiency: Secondary | ICD-10-CM

## 2020-01-24 DIAGNOSIS — Z6841 Body Mass Index (BMI) 40.0 and over, adult: Secondary | ICD-10-CM | POA: Diagnosis not present

## 2020-01-24 DIAGNOSIS — I4891 Unspecified atrial fibrillation: Secondary | ICD-10-CM | POA: Diagnosis not present

## 2020-01-24 DIAGNOSIS — Z20822 Contact with and (suspected) exposure to covid-19: Secondary | ICD-10-CM | POA: Diagnosis present

## 2020-01-24 DIAGNOSIS — Z981 Arthrodesis status: Secondary | ICD-10-CM | POA: Diagnosis not present

## 2020-01-24 DIAGNOSIS — I480 Paroxysmal atrial fibrillation: Secondary | ICD-10-CM | POA: Diagnosis not present

## 2020-01-24 DIAGNOSIS — N1831 Chronic kidney disease, stage 3a: Secondary | ICD-10-CM | POA: Diagnosis present

## 2020-01-24 DIAGNOSIS — H409 Unspecified glaucoma: Secondary | ICD-10-CM | POA: Diagnosis present

## 2020-01-24 DIAGNOSIS — E538 Deficiency of other specified B group vitamins: Secondary | ICD-10-CM | POA: Diagnosis present

## 2020-01-24 DIAGNOSIS — F419 Anxiety disorder, unspecified: Secondary | ICD-10-CM | POA: Diagnosis present

## 2020-01-24 DIAGNOSIS — E1122 Type 2 diabetes mellitus with diabetic chronic kidney disease: Secondary | ICD-10-CM | POA: Diagnosis present

## 2020-01-24 DIAGNOSIS — E782 Mixed hyperlipidemia: Secondary | ICD-10-CM | POA: Diagnosis present

## 2020-01-24 DIAGNOSIS — E1143 Type 2 diabetes mellitus with diabetic autonomic (poly)neuropathy: Secondary | ICD-10-CM | POA: Diagnosis present

## 2020-01-24 DIAGNOSIS — K3184 Gastroparesis: Secondary | ICD-10-CM | POA: Diagnosis present

## 2020-01-24 DIAGNOSIS — M79651 Pain in right thigh: Secondary | ICD-10-CM | POA: Diagnosis present

## 2020-01-24 DIAGNOSIS — I4819 Other persistent atrial fibrillation: Secondary | ICD-10-CM | POA: Diagnosis present

## 2020-01-24 DIAGNOSIS — K219 Gastro-esophageal reflux disease without esophagitis: Secondary | ICD-10-CM | POA: Diagnosis present

## 2020-01-24 DIAGNOSIS — I130 Hypertensive heart and chronic kidney disease with heart failure and stage 1 through stage 4 chronic kidney disease, or unspecified chronic kidney disease: Secondary | ICD-10-CM | POA: Diagnosis present

## 2020-01-24 DIAGNOSIS — E662 Morbid (severe) obesity with alveolar hypoventilation: Secondary | ICD-10-CM | POA: Diagnosis present

## 2020-01-24 DIAGNOSIS — I5031 Acute diastolic (congestive) heart failure: Secondary | ICD-10-CM

## 2020-01-24 DIAGNOSIS — M1990 Unspecified osteoarthritis, unspecified site: Secondary | ICD-10-CM | POA: Diagnosis present

## 2020-01-24 DIAGNOSIS — D500 Iron deficiency anemia secondary to blood loss (chronic): Secondary | ICD-10-CM | POA: Diagnosis present

## 2020-01-24 DIAGNOSIS — I2510 Atherosclerotic heart disease of native coronary artery without angina pectoris: Secondary | ICD-10-CM | POA: Diagnosis present

## 2020-01-24 DIAGNOSIS — E1121 Type 2 diabetes mellitus with diabetic nephropathy: Secondary | ICD-10-CM | POA: Diagnosis not present

## 2020-01-24 DIAGNOSIS — J9601 Acute respiratory failure with hypoxia: Secondary | ICD-10-CM

## 2020-01-24 LAB — CBC
HCT: 37.2 % (ref 36.0–46.0)
Hemoglobin: 10.6 g/dL — ABNORMAL LOW (ref 12.0–15.0)
MCH: 23.8 pg — ABNORMAL LOW (ref 26.0–34.0)
MCHC: 28.5 g/dL — ABNORMAL LOW (ref 30.0–36.0)
MCV: 83.4 fL (ref 80.0–100.0)
Platelets: 284 10*3/uL (ref 150–400)
RBC: 4.46 MIL/uL (ref 3.87–5.11)
RDW: 17 % — ABNORMAL HIGH (ref 11.5–15.5)
WBC: 8 10*3/uL (ref 4.0–10.5)
nRBC: 0 % (ref 0.0–0.2)

## 2020-01-24 LAB — CBG MONITORING, ED
Glucose-Capillary: 208 mg/dL — ABNORMAL HIGH (ref 70–99)
Glucose-Capillary: 177 mg/dL — ABNORMAL HIGH (ref 70–99)
Glucose-Capillary: 197 mg/dL — ABNORMAL HIGH (ref 70–99)

## 2020-01-24 LAB — GLUCOSE, CAPILLARY: Glucose-Capillary: 244 mg/dL — ABNORMAL HIGH (ref 70–99)

## 2020-01-24 LAB — BASIC METABOLIC PANEL
Anion gap: 10 (ref 5–15)
BUN: 15 mg/dL (ref 8–23)
CO2: 28 mmol/L (ref 22–32)
Calcium: 8.4 mg/dL — ABNORMAL LOW (ref 8.9–10.3)
Chloride: 102 mmol/L (ref 98–111)
Creatinine, Ser: 0.84 mg/dL (ref 0.44–1.00)
GFR calc Af Amer: >60 mL/min (ref >60)
GFR calc non Af Amer: >60 mL/min (ref >60)
Glucose, Bld: 158 mg/dL — ABNORMAL HIGH (ref 70–99)
Potassium: 4 mmol/L (ref 3.5–5.1)
Sodium: 140 mmol/L (ref 135–145)

## 2020-01-24 LAB — ECHOCARDIOGRAM COMPLETE
AR max vel: 1.36 cm2
AV Area VTI: 1.2 cm2
AV Area mean vel: 1.31 cm2
AV Mean grad: 5.9 mmHg
AV Peak grad: 11.2 mmHg
Ao pk vel: 1.67 m/s
Area-P 1/2: 3.85 cm2
Height: 63 in
S' Lateral: 2.99 cm
Weight: 4400 oz

## 2020-01-24 LAB — HEMOGLOBIN A1C
Hgb A1c MFr Bld: 7.9 % — ABNORMAL HIGH (ref 4.8–5.6)
Hgb A1c MFr Bld: 7.5 % — ABNORMAL HIGH (ref 4.8–5.6)
Mean Plasma Glucose: 180.03 mg/dL
Mean Plasma Glucose: 168.55 mg/dL

## 2020-01-24 MED ORDER — DARIFENACIN HYDROBROMIDE ER 7.5 MG PO TB24
7.5000 mg | ORAL_TABLET | Freq: Every day | ORAL | Status: DC
  Administered 2020-01-24 – 2020-01-26 (×3): 7.5 mg via ORAL
  Filled 2020-01-24 (×6): qty 1

## 2020-01-24 MED ORDER — VITAMIN B-12 1000 MCG PO TABS
1000.0000 ug | ORAL_TABLET | Freq: Every day | ORAL | Status: DC
  Administered 2020-01-24 – 2020-01-26 (×3): 1000 ug via ORAL
  Filled 2020-01-24 (×3): qty 1

## 2020-01-24 MED ORDER — APIXABAN 5 MG PO TABS
5.0000 mg | ORAL_TABLET | Freq: Two times a day (BID) | ORAL | Status: DC
  Administered 2020-01-24 – 2020-01-26 (×5): 5 mg via ORAL
  Filled 2020-01-24 (×6): qty 1

## 2020-01-24 MED ORDER — POLYVINYL ALCOHOL 1.4 % OP SOLN: 2.0000 [drp] | Freq: Every day | OPHTHALMIC | Status: DC | PRN

## 2020-01-24 MED ORDER — SERTRALINE HCL 50 MG PO TABS
50.0000 mg | ORAL_TABLET | Freq: Every day | ORAL | Status: DC
  Administered 2020-01-25 (×2): 50 mg via ORAL
  Filled 2020-01-24 (×2): qty 1

## 2020-01-24 MED ORDER — INSULIN ASPART 100 UNIT/ML ~~LOC~~ SOLN
0.0000 [IU] | Freq: Three times a day (TID) | SUBCUTANEOUS | Status: DC
  Administered 2020-01-24 (×2): 3 [IU] via SUBCUTANEOUS
  Administered 2020-01-24 – 2020-01-25 (×2): 5 [IU] via SUBCUTANEOUS
  Administered 2020-01-25: 8 [IU] via SUBCUTANEOUS
  Administered 2020-01-25 – 2020-01-26 (×3): 3 [IU] via SUBCUTANEOUS
  Filled 2020-01-24 (×3): qty 1

## 2020-01-24 MED ORDER — PANTOPRAZOLE SODIUM 40 MG PO TBEC: 40.0000 mg | DELAYED_RELEASE_TABLET | Freq: Two times a day (BID) | ORAL | Status: DC

## 2020-01-24 MED ORDER — ATORVASTATIN CALCIUM 40 MG PO TABS
80.0000 mg | ORAL_TABLET | Freq: Every day | ORAL | Status: DC
  Administered 2020-01-24 – 2020-01-25 (×2): 80 mg via ORAL
  Filled 2020-01-24 (×2): qty 2

## 2020-01-24 MED ORDER — LEVOTHYROXINE SODIUM 75 MCG PO TABS
150.0000 ug | ORAL_TABLET | Freq: Every day | ORAL | Status: DC
  Administered 2020-01-25 – 2020-01-26 (×2): 150 ug via ORAL
  Filled 2020-01-24 (×2): qty 2
  Filled 2020-01-24: qty 3

## 2020-01-24 MED ORDER — HYDRALAZINE HCL 25 MG PO TABS
50.0000 mg | ORAL_TABLET | Freq: Three times a day (TID) | ORAL | Status: DC
  Administered 2020-01-24: 50 mg via ORAL
  Filled 2020-01-24: qty 2

## 2020-01-24 MED ORDER — INSULIN DETEMIR 100 UNIT/ML ~~LOC~~ SOLN
30.0000 [IU] | Freq: Every day | SUBCUTANEOUS | Status: DC
  Administered 2020-01-24 – 2020-01-25 (×2): 30 [IU] via SUBCUTANEOUS
  Filled 2020-01-24 (×3): qty 0.3

## 2020-01-24 MED ORDER — DICLOFENAC SODIUM 1 % EX GEL: 2.0000 g | Freq: Four times a day (QID) | CUTANEOUS | Status: DC

## 2020-01-24 MED ORDER — NITROGLYCERIN 0.4 MG SL SUBL: 0.4000 mg | SUBLINGUAL_TABLET | SUBLINGUAL | Status: DC | PRN

## 2020-01-24 MED ORDER — DILTIAZEM HCL ER COATED BEADS 240 MG PO CP24
240.0000 mg | ORAL_CAPSULE | Freq: Every day | ORAL | Status: DC
  Filled 2020-01-24 (×3): qty 1

## 2020-01-24 MED ORDER — UMECLIDINIUM BROMIDE 62.5 MCG/INH IN AEPB
1.0000 | INHALATION_SPRAY | Freq: Every day | RESPIRATORY_TRACT | Status: DC
  Administered 2020-01-26 (×2): 1 via RESPIRATORY_TRACT
  Filled 2020-01-24: qty 7

## 2020-01-24 MED ORDER — MIRABEGRON ER 25 MG PO TB24
50.0000 mg | ORAL_TABLET | Freq: Every day | ORAL | Status: DC
  Administered 2020-01-24 – 2020-01-26 (×3): 50 mg via ORAL
  Filled 2020-01-24 (×6): qty 2

## 2020-01-24 MED ORDER — LIDOCAINE 5 % EX PTCH
2.0000 | MEDICATED_PATCH | CUTANEOUS | Status: DC
  Administered 2020-01-24 – 2020-01-26 (×3): 2 via TRANSDERMAL
  Filled 2020-01-24 (×4): qty 2

## 2020-01-24 MED ORDER — ACETAMINOPHEN 500 MG PO TABS
1000.0000 mg | ORAL_TABLET | Freq: Four times a day (QID) | ORAL | Status: DC
  Administered 2020-01-24 – 2020-01-26 (×9): 1000 mg via ORAL
  Filled 2020-01-24 (×9): qty 2

## 2020-01-24 MED ORDER — METHYLPREDNISOLONE SODIUM SUCC 40 MG IJ SOLR
40.0000 mg | Freq: Every day | INTRAMUSCULAR | Status: DC
  Administered 2020-01-24 – 2020-01-25 (×2): 40 mg via INTRAVENOUS
  Filled 2020-01-24 (×2): qty 1

## 2020-01-24 MED ORDER — INSULIN DETEMIR 100 UNIT/ML FLEXPEN: 30.0000 [IU] | PEN_INJECTOR | Freq: Two times a day (BID) | SUBCUTANEOUS | Status: DC

## 2020-01-24 MED ORDER — PANTOPRAZOLE SODIUM 40 MG IV SOLR
40.0000 mg | Freq: Two times a day (BID) | INTRAVENOUS | Status: DC
  Administered 2020-01-24 – 2020-01-26 (×5): 40 mg via INTRAVENOUS
  Filled 2020-01-24 (×5): qty 40

## 2020-01-24 MED ORDER — GABAPENTIN 400 MG PO CAPS
400.0000 mg | ORAL_CAPSULE | Freq: Three times a day (TID) | ORAL | Status: DC
  Administered 2020-01-24 – 2020-01-26 (×8): 400 mg via ORAL
  Filled 2020-01-24 (×8): qty 1

## 2020-01-24 MED ORDER — DILTIAZEM HCL 60 MG PO TABS
60.0000 mg | ORAL_TABLET | Freq: Four times a day (QID) | ORAL | Status: AC
  Administered 2020-01-24 – 2020-01-26 (×8): 60 mg via ORAL
  Filled 2020-01-24 (×3): qty 1
  Filled 2020-01-24: qty 2
  Filled 2020-01-24 (×3): qty 1

## 2020-01-24 MED ORDER — VITAMIN D 25 MCG (1000 UNIT) PO TABS
5000.0000 [IU] | ORAL_TABLET | Freq: Every day | ORAL | Status: DC
  Administered 2020-01-24 – 2020-01-26 (×3): 5000 [IU] via ORAL
  Filled 2020-01-24 (×3): qty 5

## 2020-01-24 MED ORDER — INSULIN ASPART 100 UNIT/ML ~~LOC~~ SOLN
0.0000 [IU] | Freq: Every day | SUBCUTANEOUS | Status: DC
  Administered 2020-01-25: 3 [IU] via SUBCUTANEOUS
  Administered 2020-01-25: 2 [IU] via SUBCUTANEOUS

## 2020-01-24 MED ORDER — PREDNISONE 10 MG PO TABS
10.0000 mg | ORAL_TABLET | Freq: Every day | ORAL | Status: DC
  Filled 2020-01-24: qty 1

## 2020-01-24 NOTE — Evaluation (Signed)
Physical Therapy Evaluation Patient Details Name: Ann [name] MRN: [id number] DOB: 08-Feb-1946 Today's Date: 01/24/2020   History of Present Illness  Ann [name] is a 74 y.o. female with medical history significant of adrenal insufficiency, chronic use of steroids, anxiety, osteoarthritis, CAD, history of stent placement, history of lower extremity cellulitis, chronic back pain, chronic diastolic heart failure, chronic neck pain, chronic renal insufficiency, history of diverticulosis/diverticulitis, type 2 diabetes, diabetic polyneuropathy, history of left lower extremity DVT, erosive gastritis, GERD, glaucoma, hiatal hernia, hyperlipidemia, hypertension, hypokalemia, hypothyroidism, internal hemorrhoids, morbid obesity, rectal polyp, tubular adenoma:, Vitamin B12 deficiency who is coming to the emergency department due to waking up lightheaded since Friday associated with dyspnea, decreased appetite and nausea.  She is found to be hypoxic on arrival, but denies chest pain, palpitations, dizziness, diaphoresis, PND, orthopnea, but gets frequent lower extremity edema.  She denies fever, chills, sore throat or rhinorrhea.  No wheezing or hemoptysis.  No abdominal pain, nausea, emesis, diarrhea, constipation, melena or hematochezia.  No dysuria, frequency or hematuria.    Clinical Impression  Patient functioning near baseline for functional mobility and gait, demonstrates good return for bed mobility, required HOB raised for supine to sitting, ambulated in room/hallway without loss of balance, limited mostly due to fatigue and SOB with SpO2 above 95% while on 4 LPM O2, desaturated from 92% to 86% while on room air sitting at bedside prior to gait training.  Patient put back to bed after therapy.  Patient will benefit from continued physical therapy in hospital and recommended venue below to increase strength, balance, endurance for safe ADLs and gait.     Follow Up Recommendations Home health  PT;Supervision - Intermittent    Equipment Recommendations  None recommended by PT    Recommendations for Other Services       Precautions / Restrictions Precautions Precautions: Fall Restrictions Weight Bearing Restrictions: No      Mobility  Bed Mobility Overal bed mobility: Modified Independent             General bed mobility comments: HOB raised, increased time  Transfers Overall transfer level: Needs assistance Equipment used: Rolling walker (2 wheeled) Transfers: Sit to/from [facility] Sit to Stand: Supervision Stand pivot transfers: Supervision;Min guard       General transfer comment: increased time, labored movement  Ambulation/Gait Ambulation/Gait assistance: Min guard Gait Distance (Feet): 35 Feet Assistive device: Rolling walker (2 wheeled) Gait Pattern/deviations: Decreased step length - right;Decreased step length - left;Decreased stride length;Trunk flexed Gait velocity: decreased   General Gait Details: slow slightly labored cadence without loss of balance, limited secondary to c/o fatigue while on 4 LPM O2 with SpO2 > 95%  Stairs            Wheelchair Mobility    Modified Rankin (Stroke Patients Only)       Balance Overall balance assessment: Needs assistance Sitting-balance support: No upper extremity supported;Feet supported Sitting balance-Leahy Scale: Good Sitting balance - Comments: seated at EOB   Standing balance support: Bilateral upper extremity supported;During functional activity Standing balance-Leahy Scale: Fair Standing balance comment: fair/good using RW                             Pertinent Vitals/Pain Pain Assessment: No/denies pain    Home Living Family/patient expects to be discharged to:: Private residence Living Arrangements: Spouse/significant other;Children Available Help at Discharge: Family;Available 24 hours/day Type of Home: House Home Access: Ramped entrance  Home Layout: Two level;Able to live on main level with bedroom/bathroom Home Equipment: Walker - 4 wheels;Bedside commode;Shower seat;Grab bars - toilet;Grab bars - tub/shower;Wheelchair - manual;Hospital bed      Prior Function Level of Independence: Independent with assistive device(s)         Comments: Household and short distanced Engineer, manufacturing        Extremity/Trunk Assessment   Upper Extremity Assessment Upper Extremity Assessment: Overall WFL for tasks assessed    Lower Extremity Assessment Lower Extremity Assessment: Generalized weakness    Cervical / Trunk Assessment Cervical / Trunk Assessment: Kyphotic  Communication   Communication: No difficulties  Cognition Arousal/Alertness: Awake/alert Behavior During Therapy: WFL for tasks assessed/performed Overall Cognitive Status: Within Functional Limits for tasks assessed                                        General Comments      Exercises     Assessment/Plan    PT Assessment Patient needs continued PT services  PT Problem List Decreased strength;Decreased activity tolerance;Decreased balance;Decreased mobility       PT Treatment Interventions DME instruction;Gait training;Functional mobility training;Therapeutic activities;Therapeutic exercise;Patient/family education    PT Goals (Current goals can be found in the Care Plan section)  Acute Rehab PT Goals Patient Stated Goal: return home with family to assist PT Goal Formulation: With patient Time For Goal Achievement: 01/28/20    Frequency Min 3X/week   Barriers to discharge        Co-evaluation               AM-PAC PT "6 Clicks" Mobility  Outcome Measure Help needed turning from your back to your side while in a flat bed without using bedrails?: None Help needed moving from lying on your back to sitting on the side of a flat bed without using bedrails?: A Little Help needed  moving to and from a bed to a chair (including a wheelchair)?: A Little Help needed standing up from a chair using your arms (e.g., wheelchair or bedside chair)?: None Help needed to walk in hospital room?: A Little Help needed climbing 3-5 steps with a railing? : A Lot 6 Click Score: 19    End of Session Equipment Utilized During Treatment: Oxygen Activity Tolerance: Patient tolerated treatment well;Patient limited by fatigue Patient left: in bed;with call bell/phone within reach Nurse Communication: Mobility status PT Visit Diagnosis: Unsteadiness on feet (R26.81);Other abnormalities of gait and mobility (R26.89);Muscle weakness (generalized) (M62.81)    Time: 1353-1420 PT Time Calculation (min) (ACUTE ONLY): 27 min   Charges:   PT Evaluation $PT Eval Moderate Complexity: 1 Mod PT Treatments $Therapeutic Activity: 23-37 mins        3:38 PM, 01/24/20 Lonell Grandchild, MPT Physical Therapist with [facility] 336 [phone] office [phone] mobile phone

## 2020-01-24 NOTE — Progress Notes (Signed)
PROGRESS NOTE  Ann [name] WTU:[id number] DOB: 29-Sep-1945 DOA: 01/23/2020 PCP: Rosita Fire, MD  Brief History:  74 year old female with a history of coronary disease, COPD, persistent atrial fibrillation on apixaban, nonsustained ventricular tachycardia, diastolic CHF, morbid obesity, adrenal insufficiency, diabetes mellitus type 2, hypertension, hyperlipidemia presenting with multiple nonspecific/vague complaints.  Notably, the patient has been complaining of generalized weakness, nausea, and decreased oral intake that began on 01/21/2020.  She denies any new medications.  She denies any fevers, chills, chest pain, palpitations, headache.  She does complain of some dyspnea on exertion that has been gradually worsening over the past month.  She denies any orthopnea or PND.  She has an occasional nonproductive cough.  She denies any vomiting, abdominal pain, dysuria, hematuria, diarrhea, hematochezia, melena.  The patient endorsed compliance with all her medications. In addition, the patient also complains of right anterior thigh pain.  She denies any recent injuries or trauma to her leg.  She states that she was turning over in bed in the early morning of 01/14/2020 and it suddenly caused her to have pain.  She was having some pain in the anterior right thigh with ambulation.  She denies any back pain or actual hip pain.  She denies any rashes, edema, erythema. Notably, the patient was recently admitted to the hospital from 12/10/2019 to 12/11/2019 for generalized weakness.  It was felt that her adrenal insufficiency may have contributed to her symptoms.  She was discharged home with prednisone 10 mg twice daily.  Her prednisone was recently decreased back to 10 mg daily, although she is not able to tell me when exactly this occurred. In the emergency department, the patient was afebrile hemodynamically stable with oxygen saturation 85% on room air.  She was placed on 2 L supplemental oxygen  with oxygen saturation 95-96%.  She was noted to have atrial fibrillation with RVR with heart rate 140-150.  She was started on diltiazem drip.  BMP, and CBC were essentially unremarkable with her serum creatinine and hemoglobin at baseline.  BNP was 330.  Chest x-ray showed increased interstitial markings and vascular congestion.  Urinalysis was negative for pyuria.  Assessment/Plan: Acute respiratory failure with hypoxia  -Secondary to CHF in the setting of underlying COPD and OHS -Personally reviewed chest x-ray--increased interstitial markings, vascular congestion noted -Wean oxygen as tolerated back to room air  Acute on chronic diastolic CHF -Start IV furosemide -Daily weights -Accurate I's and O's  Atrial fibrillation with RVR--persistent -Continue diltiazem drip -Continue apixaban -07/14/2019 echo EF 55 to 60%, no WMA, increased LVEDP, trivial TR -Repeat echo -01/06/2020 TSH 1.22  Adrenal insufficiency -Temporarily increase steroid dosing for stress -start solumedrol 40 mg IV daily  Coronary artery disease -Denies any chest discomfort -Continue apixaban -Continue Lipitor  CKD stage IIIa -Baseline creatinine 0.8-1.1  Diabetes mellitus type 2, insulin-dependent -Start reduced dose Levemir -NovoLog sliding scale -Anticipate elevated CBGs secondary to steroids  Hypothyroidism -Continue levothyroxine  Right anterior thigh pain -Venous duplex rule DVT -Try lidoderm -acetaminophen ATC  COPD -Stable without wheezing  Nausea -This is multifactorial including likely a component of diabetic gastroparesis, adrenal insufficiency, and atrial fibrillation with RVR -Monitor clinically -UA negative for pyuria -IV protonix  Hyperlipidemia -Continue statin     Status is: Observation  The patient will require care spanning > 2 midnights and should be moved to inpatient because: IV treatments appropriate due to intensity of illness or inability to take PO  Dispo:  The  patient is from: Home              Anticipated d/c is to: Home              Anticipated d/c date is: 2 days              Patient currently is not medically stable to d/c.        Family Communication:  no Family at bedside  Consultants:  none  Code Status:  FULL   DVT Prophylaxis:  apixaban   Procedures: As Listed in Progress Note Above  Antibiotics: None      Subjective: Patient continues to complain of some nausea.  She states her right thigh pain is bothering her.  She denies any headache, fevers, chills, chest pain, vomiting, diarrhea, abdominal pain, dysuria, hematuria.  Objective: Vitals:   01/24/20 0030 01/24/20 0430 01/24/20 0500 01/24/20 0700  BP: (!) 160/93 123/74 114/66 102/61  Pulse: 98 67 (!) 53 79  Resp: 16 17 17 12   Temp:      TempSrc:      SpO2: 97% 95% 96% 95%  Weight:      Height:        Intake/Output Summary (Last 24 hours) at 01/24/2020 0720 Last data filed at 01/24/2020 0552 Gross per 24 hour  Intake 520.93 ml  Output 1863 ml  Net -1342.07 ml   Weight change:  Exam:   General:  Pt is alert, follows commands appropriately, not in acute distress  HEENT: No icterus, No thrush, No neck mass, [place]/AT  Cardiovascular: IRRR, S1/S2, no rubs, no gallops  Respiratory: bibasilar crackles. No wheeze  Abdomen: Soft/+BS, non tender, non distended, no guarding  Extremities: trace LE edema, No lymphangitis, No petechiae, No rashes, no synovitis   Data Reviewed: I have personally reviewed following labs and imaging studies Basic Metabolic Panel: Recent Labs  Lab 01/23/20 1600  NA 139  K 4.0  CL 99  CO2 28  GLUCOSE 116*  BUN 19  CREATININE 1.11*  CALCIUM 9.0  MG 2.0   Liver Function Tests: No results for input(s): AST, ALT, ALKPHOS, BILITOT, PROT, ALBUMIN in the last 168 hours. No results for input(s): LIPASE, AMYLASE in the last 168 hours. No results for input(s): AMMONIA in the last 168 hours. Coagulation Profile: Recent Labs    Lab 01/23/20 1600  INR 1.3*   CBC: Recent Labs  Lab 01/23/20 1600  WBC 8.0  HGB 10.7*  HCT 37.2  MCV 82.9  PLT 300   Cardiac Enzymes: No results for input(s): CKTOTAL, CKMB, CKMBINDEX, TROPONINI in the last 168 hours. BNP: Invalid input(s): POCBNP CBG: No results for input(s): GLUCAP in the last 168 hours. HbA1C: No results for input(s): HGBA1C in the last 72 hours. Urine analysis:    Component Value Date/Time   COLORURINE YELLOW 01/23/2020 1819   APPEARANCEUR CLEAR 01/23/2020 1819   LABSPEC 1.012 01/23/2020 1819   PHURINE 5.0 01/23/2020 1819   GLUCOSEU NEGATIVE 01/23/2020 1819   HGBUR NEGATIVE 01/23/2020 1819   BILIRUBINUR NEGATIVE 01/23/2020 1819   KETONESUR NEGATIVE 01/23/2020 1819   PROTEINUR NEGATIVE 01/23/2020 1819   UROBILINOGEN 0.2 03/15/2013 1504   NITRITE NEGATIVE 01/23/2020 1819   LEUKOCYTESUR NEGATIVE 01/23/2020 1819   Sepsis Labs: @LABRCNTIP (procalcitonin:4,lacticidven:4) ) Recent Results (from the past 240 hour(s))  SARS Coronavirus 2 by RT PCR (hospital order, performed in [place] hospital lab) Nasopharyngeal Nasopharyngeal Swab     Status: None   Collection Time: 01/23/20  3:57 PM  Specimen: Nasopharyngeal Swab  Result Value Ref Range Status   SARS Coronavirus 2 NEGATIVE NEGATIVE Final    Comment: (NOTE) SARS-CoV-2 target nucleic acids are NOT DETECTED.  The SARS-CoV-2 RNA is generally detectable in upper and lower respiratory specimens during the acute phase of infection. The lowest concentration of SARS-CoV-2 viral copies this assay can detect is 250 copies / mL. A negative result does not preclude SARS-CoV-2 infection and should not be used as the sole basis for treatment or other patient management decisions.  A negative result may occur with improper specimen collection / handling, submission of specimen other than nasopharyngeal swab, presence of viral mutation(s) within the areas targeted by this assay, and inadequate number of  viral copies (<250 copies / mL). A negative result must be combined with clinical observations, patient history, and epidemiological information.  Fact Sheet for Patients:   StrictlyIdeas.no  Fact Sheet for Healthcare Providers: BankingDealers.co.za  This test is not yet approved or  cleared by the [place] FDA and has been authorized for detection and/or diagnosis of SARS-CoV-2 by FDA under an Emergency Use Authorization (EUA).  This EUA will remain in effect (meaning this test can be used) for the duration of the COVID-19 declaration under Section 564(b)(1) of the Act, 21 U.S.C. section 360bbb-3(b)(1), unless the authorization is terminated or revoked sooner.  Performed at [facility], [address], [place], [place] [address]      Scheduled Meds: . furosemide  40 mg Intravenous BID   Continuous Infusions: . sodium chloride 100 mL/hr at 01/24/20 0616  . diltiazem (CARDIZEM) infusion 7.5 mg/hr (01/24/20 0617)    Procedures/Studies: DG Chest Portable 1 View  Result Date: 01/23/2020 CLINICAL DATA:  Hypoxia and shortness of breath. EXAM: PORTABLE CHEST 1 VIEW COMPARISON:  December 10, 2019 FINDINGS: Mildly decreased lung volumes are seen which is likely secondary to the degree of patient inspiration. Mild, diffuse, chronic appearing increased interstitial lung markings are noted. Mild, stable linear scarring and/or atelectasis is seen within the mid left lung. There is no evidence of a pleural effusion or pneumothorax. The cardiac silhouette is moderately enlarged. A radiopaque fusion plate and screws are seen overlying the lower cervical spine. The visualized skeletal structures are otherwise unremarkable. IMPRESSION: Mild, diffuse, chronic appearing increased interstitial lung markings with mild, stable mid left lung linear scarring and/or atelectasis. Electronically Signed   By: Virgina Norfolk M.D.   On: 01/23/2020 16:06    Orson Eva, DO  [facility]  If 7PM-7AM, please contact night-coverage [email or web address] Password [email or web address] 01/24/2020, 7:20 AM   LOS: 0 days

## 2020-01-24 NOTE — TOC Initial Note (Signed)
Transition of Care [facility]) - Initial/Assessment Note    Patient Details  Name: Ann [name] MRN: [id number] Date of Birth: [birth year]  Transition of [facility]) CM/SW Contact:    Salome Arnt, [place] Phone Number: 01/24/2020, 11:28 AM  Clinical Narrative:  Pt admitted due to paroxysmal atrial fibrillation with RVR. TOC consulted due to heart failure and high risk readmission. Pt lives with husband, 2 children, and 3 grandchildren. She reports having a lot of help at home and feels she manages well. She is eager to d/c from hospital and plans to return home when medically stable.   LCSW completed heart failure screen with pt. She states she does not complete daily weights due to her balance issues. Pt indicates she has followed a heart healthy diet since her diagnosis. She takes her medications as prescribed and is followed by [facility] cardiology. TOC to continue to follow and assist with d/c planning needs.                  Expected Discharge Plan: Home/Self Care Barriers to Discharge: Continued Medical Work up   Patient Goals and CMS Choice Patient states their goals for this hospitalization and ongoing recovery are:: return home      Expected Discharge Plan and Services Expected Discharge Plan: Home/Self Care In-house Referral: Clinical Social Work     Living arrangements for the past 2 months: Single Family Home                                      Prior Living Arrangements/Services Living arrangements for the past 2 months: Single Family Home Lives with:: Spouse, Adult Children Patient language and need for interpreter reviewed:: Yes        Need for Family Participation in Patient Care: Yes (Comment) Care giver support system in place?: Yes (comment) Current home services: DME (walker, wheelchair, potty chair, shower chair) Criminal Activity/Legal Involvement Pertinent to Current Situation/Hospitalization: No - Comment as needed  Activities of Daily  Living      Permission Sought/Granted                  Emotional Assessment Appearance:: Appears stated age Attitude/Demeanor/Rapport: Engaged Affect (typically observed): Accepting Orientation: : Oriented to Self, Oriented to Place, Oriented to  Time, Oriented to Situation Alcohol / Substance Use: Not Applicable Psych Involvement: No (comment)  Admission diagnosis:  Atrial fibrillation with RVR (HCC) [I48.91] Acute on chronic diastolic CHF (congestive heart failure) ([place]) [I50.33] Patient Active Problem List   Diagnosis Date Noted  . Acute respiratory failure with hypoxia ([place]) 01/24/2020  . Acute on chronic diastolic CHF (congestive heart failure) ([place]) 01/24/2020  . Atrial fibrillation with RVR ([place]) 01/23/2020  . Anxiety   . Type 2 diabetes mellitus ([place])   . Weakness 12/10/2019  . Acute pain of left knee 08/31/2019  . History of DVT (deep vein thrombosis)   . Coronary artery disease of native artery of native heart with stable angina pectoris ([place])   . Chronic obstructive pulmonary disease ([place])   . Atrial fibrillation with rapid ventricular response ([place]) 07/13/2019  . COPD exacerbation ([place]) 04/04/2018  . Comprehensive diabetic foot examination, type 2 DM, encounter for ([place]) 04/04/2018  . PAF (paroxysmal atrial fibrillation) ([place]) 04/04/2018  . Constipation by delayed colonic transit 10/23/2016  . Fatty liver 12/11/2015  . GERD (gastroesophageal reflux disease) 07/14/2015  . VRE (vancomycin resistant enterococcus) culture positive  10/10/2012  . Nonsustained ventricular tachycardia ([place]) 10/10/2012  . Cellulitis of arm, right 10/06/2012  . Hydronephrosis 09/15/2012  . Abdominal mass, right lower quadrant 09/10/2012  . Microcytic anemia 08/22/2012  . Acute on chronic diastolic heart failure ([place]) 08/06/2012  . UTI (urinary tract infection) 07/30/2012  . Atrial fibrillation ([place]) 07/02/2012  . Vitamin B12 deficiency 06/28/2012  . Sinusitis chronic, frontal  06/28/2012  . Morbid obesity ([place]) 06/28/2012  . Chronic kidney disease, stage III (moderate) 06/28/2012  . ARF (acute renal failure) ([place]) 06/27/2012  . Adrenal insufficiency ([place]) 01/07/2012  . Uncontrolled type 2 diabetes mellitus with hyperglycemia ([place]) 04/19/2011  . Chronic diastolic heart failure ([place]) 04/19/2011  . GANGLION OF TENDON SHEATH 02/21/2010  . Acquired hypothyroidism 12/01/2008  . Mixed hyperlipidemia 12/01/2008  . Essential hypertension 12/01/2008  . Coronary atherosclerosis 12/01/2008  . RENAL FAILURE, CHRONIC 12/01/2008   PCP:  Rosita Fire, MD Pharmacy:   [place], [place] [address]. [place] [address] Phone: [phone] Fax: [phone]  [facility] [place], [place] [address] [place] [address] Phone: [phone] Fax: [place], [place] [address] Phone: [phone] Fax: [phone]     Social Determinants of Health (SDOH) Interventions    Readmission Risk Interventions Readmission Risk Prevention Plan 01/24/2020  Transportation Screening Complete  HRI or Home Care Consult Complete  Social Work Consult for [place] Planning/Counseling Complete  Palliative Care Screening Not Applicable  Medication Review Press photographer) Complete  Some recent data might be hidden

## 2020-01-24 NOTE — Plan of Care (Signed)
  Problem: Acute Rehab PT Goals(only PT should resolve) Goal: Pt Will Go Supine/Side To Sit Outcome: Progressing Flowsheets (Taken 01/24/2020 1539) Pt will go Supine/Side to Sit:  with modified independence  Independently Goal: Patient Will Transfer Sit To/From Stand Outcome: Progressing Flowsheets (Taken 01/24/2020 1539) Patient will transfer sit to/from stand:  with modified independence  with supervision Goal: Pt Will Transfer Bed To Chair/Chair To Bed Outcome: Progressing Flowsheets (Taken 01/24/2020 1539) Pt will Transfer Bed to Chair/Chair to Bed:  with modified independence  with supervision Goal: Pt Will Ambulate Outcome: Progressing Flowsheets (Taken 01/24/2020 1539) Pt will Ambulate:  50 feet  with supervision  with rolling walker   3:40 PM, 01/24/20 Lonell Grandchild, MPT Physical Therapist with [facility] [phone] office [phone] mobile phone

## 2020-01-24 NOTE — Progress Notes (Signed)
*  PRELIMINARY RESULTS* Echocardiogram 2D Echocardiogram has been performed.  Ann [name] 01/24/2020, 1:11 PM

## 2020-01-25 ENCOUNTER — Ambulatory Visit: Payer: Medicare Other | Admitting: Student

## 2020-01-25 LAB — BASIC METABOLIC PANEL
Anion gap: 9 (ref 5–15)
BUN: 20 mg/dL (ref 8–23)
CO2: 30 mmol/L (ref 22–32)
Calcium: 9.3 mg/dL (ref 8.9–10.3)
Chloride: 99 mmol/L (ref 98–111)
Creatinine, Ser: 0.96 mg/dL (ref 0.44–1.00)
GFR calc Af Amer: >60 mL/min (ref >60)
GFR calc non Af Amer: 58 mL/min — ABNORMAL LOW (ref >60)
Glucose, Bld: 189 mg/dL — ABNORMAL HIGH (ref 70–99)
Potassium: 4.5 mmol/L (ref 3.5–5.1)
Sodium: 138 mmol/L (ref 135–145)

## 2020-01-25 LAB — GLUCOSE, CAPILLARY
Glucose-Capillary: 180 mg/dL — ABNORMAL HIGH (ref 70–99)
Glucose-Capillary: 205 mg/dL — ABNORMAL HIGH (ref 70–99)
Glucose-Capillary: 263 mg/dL — ABNORMAL HIGH (ref 70–99)
Glucose-Capillary: 276 mg/dL — ABNORMAL HIGH (ref 70–99)

## 2020-01-25 LAB — MAGNESIUM: Magnesium: 2.2 mg/dL (ref 1.7–2.4)

## 2020-01-25 MED ORDER — FUROSEMIDE 40 MG PO TABS
40.0000 mg | ORAL_TABLET | Freq: Every day | ORAL | Status: DC
  Administered 2020-01-26: 40 mg via ORAL
  Filled 2020-01-25: qty 1

## 2020-01-25 MED ORDER — INSULIN ASPART 100 UNIT/ML ~~LOC~~ SOLN
3.0000 [IU] | Freq: Three times a day (TID) | SUBCUTANEOUS | Status: DC
  Administered 2020-01-26: 3 [IU] via SUBCUTANEOUS

## 2020-01-25 MED ORDER — INSULIN DETEMIR 100 UNIT/ML ~~LOC~~ SOLN
40.0000 [IU] | Freq: Every day | SUBCUTANEOUS | Status: DC
  Administered 2020-01-26: 40 [IU] via SUBCUTANEOUS
  Filled 2020-01-25 (×2): qty 0.4

## 2020-01-25 MED ORDER — METOPROLOL TARTRATE 5 MG/5ML IV SOLN
5.0000 mg | INTRAVENOUS | Status: DC | PRN
  Administered 2020-01-25: 5 mg via INTRAVENOUS
  Filled 2020-01-25: qty 5

## 2020-01-25 MED ORDER — DILTIAZEM HCL ER COATED BEADS 240 MG PO CP24
240.0000 mg | ORAL_CAPSULE | Freq: Every day | ORAL | Status: DC
  Administered 2020-01-26: 240 mg via ORAL
  Filled 2020-01-25: qty 1

## 2020-01-25 MED ORDER — PREDNISONE 20 MG PO TABS
20.0000 mg | ORAL_TABLET | Freq: Every day | ORAL | Status: DC
  Administered 2020-01-26: 20 mg via ORAL
  Filled 2020-01-25: qty 1

## 2020-01-25 NOTE — Progress Notes (Signed)
Inpatient Diabetes Program Recommendations  AACE/ADA: New Consensus Statement on Inpatient Glycemic Control (2015)  Target Ranges:  Prepandial:   less than 140 mg/dL      Peak postprandial:   less than 180 mg/dL (1-2 hours)      Critically ill patients:  140 - 180 mg/dL   Results for BEONKA, AMESQUITA (MRN [id number]) as of 01/25/2020 13:45  Ref. Range 01/25/2020 07:50 01/25/2020 11:37  Glucose-Capillary Latest Ref Range: 70 - 99 mg/dL 180 (H)  3 units NOVOLOG  205 (H)  5 units NOVOLOG +  30 units LEVEMIR    Home DM Meds: Levemir 30-50 units BID     Current Insulin Orders: Levemir 30 units Daily         Novolog 0-15 ac/hs    Getting Solmedrol 40 mg Daily.   Both Levemir and Novolog started yest at 11am.     MD- Please consider:  1. Increase Levemir to 35 units Daily  2. Start Novolog Meal Coverage: Novolog 3 units TID with meals  (Please add the following Hold Parameters: Hold if pt eats <50% of meal, Hold if pt NPO)    --Will follow patient during hospitalization--  Wyn Quaker RN, MSN, CDE Diabetes Coordinator Inpatient Glycemic Control Team Team Pager: [phone] (8a-5p)

## 2020-01-25 NOTE — Progress Notes (Signed)
PROGRESS NOTE  Ann [name] CHY:[id number] DOB: Feb 06, 1946 DOA: 01/23/2020 PCP: Rosita Fire, MD Brief History:  74 year old female with a history of coronary disease, COPD, persistent atrial fibrillation on apixaban, nonsustained ventricular tachycardia, diastolic CHF, morbid obesity, adrenal insufficiency, diabetes mellitus type 2, hypertension, hyperlipidemia presenting with multiple nonspecific/vague complaints.  Notably, the patient has been complaining of generalized weakness, nausea, and decreased oral intake that began on 01/21/2020.  She denies any new medications.  She denies any fevers, chills, chest pain, palpitations, headache.  She does complain of some dyspnea on exertion that has been gradually worsening over the past month.  She denies any orthopnea or PND.  She has an occasional nonproductive cough.  She denies any vomiting, abdominal pain, dysuria, hematuria, diarrhea, hematochezia, melena.  The patient endorsed compliance with all her medications. In addition, the patient also complains of right anterior thigh pain.  She denies any recent injuries or trauma to her leg.  She states that she was turning over in bed in the early morning of 01/14/2020 and it suddenly caused her to have pain.  She was having some pain in the anterior right thigh with ambulation.  She denies any back pain or actual hip pain.  She denies any rashes, edema, erythema. Notably, the patient was recently admitted to the hospital from 12/10/2019 to 12/11/2019 for generalized weakness.  It was felt that her adrenal insufficiency may have contributed to her symptoms.  She was discharged home with prednisone 10 mg twice daily.  Her prednisone was recently decreased back to 10 mg daily, although she is not able to tell me when exactly this occurred. In the emergency department, the patient was afebrile hemodynamically stable with oxygen saturation 85% on room air.  She was placed on 2 L supplemental oxygen with  oxygen saturation 95-96%.  She was noted to have atrial fibrillation with RVR with heart rate 140-150.  She was started on diltiazem drip.  BMP, and CBC were essentially unremarkable with her serum creatinine and hemoglobin at baseline.  BNP was 330.  Chest x-ray showed increased interstitial markings and vascular congestion.  Urinalysis was negative for pyuria.  Assessment/Plan: Acute respiratory failure with hypoxia  -Secondary to CHF in the setting of underlying COPD and OHS -Personally reviewed chest x-ray--increased interstitial markings, vascular congestion noted -stable on 3L -Wean oxygen as tolerated back to room air  Acute on chronic diastolic CHF -Started IV furosemide>>po furosemide on 9/1 -Daily weights -Accurate I's and O's--incomplete  Atrial fibrillation with RVR--persistent -Continue diltiazem drip>>po diltiazem -Continue apixaban -07/14/2019 echo EF 55 to 60%, no WMA, increased LVEDP, trivial TR -01/24/20 echo--EF 60-65%, no WMA -01/06/2020 TSH 1.22  Adrenal insufficiency -Temporarily increase steroid dosing for stress -start solumedrol 40 mg IV daily>>po prednisone  Coronary artery disease -Denies any chest discomfort -Continue apixaban -Continue Lipitor  CKD stage IIIa -Baseline creatinine 0.8-1.1  Diabetes mellitus type 2, insulin-dependent -Start reduced dose Levemir-->increase to 40 units -add novolog 3 units with meals -NovoLog sliding scale -Anticipate elevated CBGs secondary to steroids  Hypothyroidism -Continue levothyroxine  Right anterior thigh pain -Try lidoderm -acetaminophen ATC -improved  COPD -Stable without wheezing  Nausea -This is multifactorial including likely a component of diabetic gastroparesis, adrenal insufficiency, and atrial fibrillation with RVR -Monitor clinically -UA negative for pyuria -IV protonix -tolerating diet -may be related to congestive gastropathy from CHF  Hyperlipidemia -Continue  statin     Status is: Inpatient  The patient will require  care spanning > 2 midnights and should be moved to inpatient because: IV treatments appropriate due to intensity of illness or inability to take PO  Dispo: The patient is from: Home  Anticipated d/c is to: Home  Anticipated d/c date is: 2 days  Patient currently is not medically stable to d/c.        Family Communication:  no Family at bedside  Consultants:  none  Code Status:  FULL   DVT Prophylaxis:  apixaban   Procedures: As Listed in Progress Note Above  Antibiotics: None     Status is: Inpatient  Remains inpatient appropriate because:IV treatments appropriate due to intensity of illness or inability to take PO   Dispo: The patient is from: Home              Anticipated d/c is to: Home              Anticipated d/c date is: 1 day              Patient currently is not medically stable to d/c.        Family Communication:  no Family at bedside  Consultants:  none  Code Status:  FULL   DVT Prophylaxis:  apixaban   Procedures: As Listed in Progress Note Above  Antibiotics: None      Subjective: Patient denies fevers, chills, headache, chest pain, dyspnea, nausea, vomiting, diarrhea, abdominal pain, dysuria, hematuria, hematochezia, and melena.   Objective: Vitals:   01/25/20 0600 01/25/20 0800 01/25/20 1138 01/25/20 1456  BP:  (!) 100/50 114/67 130/87  Pulse:  92    Resp:  13 14 15   Temp:  98 F (36.7 C) 98 F (36.7 C) 98.2 F (36.8 C)  TempSrc:   Oral Oral  SpO2:  92% 98% 95%  Weight: 124.1 kg     Height:        Intake/Output Summary (Last 24 hours) at 01/25/2020 1749 Last data filed at 01/25/2020 0500 Gross per 24 hour  Intake --  Output 800 ml  Net -800 ml   Weight change: -0.639 kg Exam:   General:  Pt is alert, follows commands appropriately, not in acute distress  HEENT: No icterus, No thrush, No  neck mass, [place]/AT  Cardiovascular: IRRR, S1/S2, no rubs, no gallops  Respiratory: bibasilar crackles. No wheeze  Abdomen: Soft/+BS, non tender, non distended, no guarding  Extremities: trace LE edema, No lymphangitis, No petechiae, No rashes, no synovitis   Data Reviewed: I have personally reviewed following labs and imaging studies Basic Metabolic Panel: Recent Labs  Lab 01/23/20 1600 01/24/20 0942 01/25/20 0637  NA 139 140 138  K 4.0 4.0 4.5  CL 99 102 99  CO2 28 28 30   GLUCOSE 116* 158* 189*  BUN 19 15 20   CREATININE 1.11* 0.84 0.96  CALCIUM 9.0 8.4* 9.3  MG 2.0  --  2.2   Liver Function Tests: No results for input(s): AST, ALT, ALKPHOS, BILITOT, PROT, ALBUMIN in the last 168 hours. No results for input(s): LIPASE, AMYLASE in the last 168 hours. No results for input(s): AMMONIA in the last 168 hours. Coagulation Profile: Recent Labs  Lab 01/23/20 1600  INR 1.3*   CBC: Recent Labs  Lab 01/23/20 1600 01/24/20 0942  WBC 8.0 8.0  HGB 10.7* 10.6*  HCT 37.2 37.2  MCV 82.9 83.4  PLT 300 284   Cardiac Enzymes: No results for input(s): CKTOTAL, CKMB, CKMBINDEX, TROPONINI in the last 168 hours. BNP: Invalid input(s): POCBNP  CBG: Recent Labs  Lab 01/24/20 1837 01/24/20 2104 01/25/20 0750 01/25/20 1137 01/25/20 1609  GLUCAP 197* 244* 180* 205* 263*   HbA1C: Recent Labs    01/23/20 2226 01/24/20 0942  HGBA1C 7.9* 7.5*   Urine analysis:    Component Value Date/Time   COLORURINE YELLOW 01/23/2020 1819   APPEARANCEUR CLEAR 01/23/2020 1819   LABSPEC 1.012 01/23/2020 1819   PHURINE 5.0 01/23/2020 1819   GLUCOSEU NEGATIVE 01/23/2020 1819   HGBUR NEGATIVE 01/23/2020 1819   BILIRUBINUR NEGATIVE 01/23/2020 1819   KETONESUR NEGATIVE 01/23/2020 1819   PROTEINUR NEGATIVE 01/23/2020 1819   UROBILINOGEN 0.2 03/15/2013 1504   NITRITE NEGATIVE 01/23/2020 1819   LEUKOCYTESUR NEGATIVE 01/23/2020 1819   Sepsis  Labs: @LABRCNTIP (procalcitonin:4,lacticidven:4) ) Recent Results (from the past 240 hour(s))  SARS Coronavirus 2 by RT PCR (hospital order, performed in [place] hospital lab) Nasopharyngeal Nasopharyngeal Swab     Status: None   Collection Time: 01/23/20  3:57 PM   Specimen: Nasopharyngeal Swab  Result Value Ref Range Status   SARS Coronavirus 2 NEGATIVE NEGATIVE Final    Comment: (NOTE) SARS-CoV-2 target nucleic acids are NOT DETECTED.  The SARS-CoV-2 RNA is generally detectable in upper and lower respiratory specimens during the acute phase of infection. The lowest concentration of SARS-CoV-2 viral copies this assay can detect is 250 copies / mL. A negative result does not preclude SARS-CoV-2 infection and should not be used as the sole basis for treatment or other patient management decisions.  A negative result may occur with improper specimen collection / handling, submission of specimen other than nasopharyngeal swab, presence of viral mutation(s) within the areas targeted by this assay, and inadequate number of viral copies (<250 copies / mL). A negative result must be combined with clinical observations, patient history, and epidemiological information.  Fact Sheet for Patients:   StrictlyIdeas.no  Fact Sheet for Healthcare Providers: BankingDealers.co.za  This test is not yet approved or  cleared by the [place] FDA and has been authorized for detection and/or diagnosis of SARS-CoV-2 by FDA under an Emergency Use Authorization (EUA).  This EUA will remain in effect (meaning this test can be used) for the duration of the COVID-19 declaration under Section 564(b)(1) of the Act, 21 U.S.C. section 360bbb-3(b)(1), unless the authorization is terminated or revoked sooner.  Performed at [facility], [address] [place], [place] [address]      Scheduled Meds: . acetaminophen  1,000 mg Oral Q6H  . apixaban  5 mg  Oral BID  . atorvastatin  80 mg Oral q1800  . cholecalciferol  5,000 Units Oral Daily  . darifenacin  7.5 mg Oral Daily  . diltiazem  60 mg Oral Q6H  . furosemide  40 mg Intravenous BID  . gabapentin  400 mg Oral TID  . insulin aspart  0-15 Units Subcutaneous TID WC  . insulin aspart  0-5 Units Subcutaneous QHS  . insulin detemir  30 Units Subcutaneous Daily  . levothyroxine  150 mcg Oral QAC breakfast  . lidocaine  2 patch Transdermal Q24H  . methylPREDNISolone (SOLU-MEDROL) injection  40 mg Intravenous Daily  . mirabegron ER  50 mg Oral Daily  . pantoprazole (PROTONIX) IV  40 mg Intravenous Q12H  . sertraline  50 mg Oral QHS  . umeclidinium bromide  1 puff Inhalation Daily  . cyanocobalamin  1,000 mcg Oral Daily   Continuous Infusions:  Procedures/Studies: DG Chest Portable 1 View  Result Date: 01/23/2020 CLINICAL DATA:  Hypoxia and shortness of  breath. EXAM: PORTABLE CHEST 1 VIEW COMPARISON:  December 10, 2019 FINDINGS: Mildly decreased lung volumes are seen which is likely secondary to the degree of patient inspiration. Mild, diffuse, chronic appearing increased interstitial lung markings are noted. Mild, stable linear scarring and/or atelectasis is seen within the mid left lung. There is no evidence of a pleural effusion or pneumothorax. The cardiac silhouette is moderately enlarged. A radiopaque fusion plate and screws are seen overlying the lower cervical spine. The visualized skeletal structures are otherwise unremarkable. IMPRESSION: Mild, diffuse, chronic appearing increased interstitial lung markings with mild, stable mid left lung linear scarring and/or atelectasis. Electronically Signed   By: Virgina Norfolk M.D.   On: 01/23/2020 16:06   ECHOCARDIOGRAM COMPLETE  Result Date: 01/24/2020    ECHOCARDIOGRAM REPORT   Patient Name:   Ann [name] Date of Exam: 01/24/2020 Medical Rec #:  [id number]        Height:       63.0 in Accession #:    [id number]       Weight:       275.0 lb  Date of Birth:  1946/02/04         BSA:          2.214 m Patient Age:    46 years         BP:           102/61 mmHg Patient Gender: F                HR:           79 bpm. Exam Location:  [facility] Procedure: 2D Echo, Cardiac Doppler and Color Doppler Indications:    CHF-Acute Diastolic 196.22 / W97.98  History:        Patient has prior history of Echocardiogram examinations, most                 recent 07/14/2019. CHF, Arrythmias:Atrial Fibrillation; Risk                 Factors:Diabetes, Dyslipidemia, Former Smoker and Hypertension.                 Chronic kidney disease, stage III (moderate), Morbid obesity.  Sonographer:    Alvino Chapel RCS Referring Phys: [phone] Arrian Manson IMPRESSIONS  1. Left ventricular ejection fraction, by estimation, is 60 to 65%. The left ventricle has normal function. The left ventricle has no regional wall motion abnormalities. There is moderate left ventricular hypertrophy. Left ventricular diastolic parameters are indeterminate.  2. Right ventricular systolic function is normal. The right ventricular size is normal.  3. Left atrial size was mildly dilated.  4. The mitral valve is normal in structure. No evidence of mitral valve regurgitation. No evidence of mitral stenosis.  5. The aortic valve has an indeterminant number of cusps. Aortic valve regurgitation is not visualized. No aortic stenosis is present.  6. The inferior vena cava is normal in size with greater than 50% respiratory variability, suggesting right atrial pressure of 3 mmHg. FINDINGS  Left Ventricle: Left ventricular ejection fraction, by estimation, is 60 to 65%. The left ventricle has normal function. The left ventricle has no regional wall motion abnormalities. The left ventricular internal cavity size was normal in size. There is  moderate left ventricular hypertrophy. Left ventricular diastolic parameters are indeterminate. Right Ventricle: The right ventricular size is normal. No increase in right ventricular wall  thickness. Right ventricular systolic function is normal. Left Atrium: Left atrial size was  mildly dilated. Right Atrium: Right atrial size was normal in size. Pericardium: A small pericardial effusion is present. The pericardial effusion is circumferential. Mitral Valve: The mitral valve is normal in structure. No evidence of mitral valve regurgitation. No evidence of mitral valve stenosis. Tricuspid Valve: The tricuspid valve is normal in structure. Tricuspid valve regurgitation is not demonstrated. No evidence of tricuspid stenosis. Aortic Valve: The aortic valve has an indeterminant number of cusps. Aortic valve regurgitation is not visualized. No aortic stenosis is present. Aortic valve mean gradient measures 5.9 mmHg. Aortic valve peak gradient measures 11.2 mmHg. Aortic valve area, by VTI measures 1.20 cm. Pulmonic Valve: The pulmonic valve was not well visualized. Pulmonic valve regurgitation is not visualized. No evidence of pulmonic stenosis. Aorta: The aortic root is normal in size and structure. Venous: The inferior vena cava is normal in size with greater than 50% respiratory variability, suggesting right atrial pressure of 3 mmHg. IAS/Shunts: No atrial level shunt detected by color flow Doppler.  LEFT VENTRICLE PLAX 2D LVIDd:         4.80 cm LVIDs:         2.99 cm LV PW:         1.51 cm LV IVS:        1.31 cm LVOT diam:     1.70 cm LV SV:         35 LV SV Index:   16 LVOT Area:     2.27 cm  RIGHT VENTRICLE TAPSE (M-mode): 1.8 cm LEFT ATRIUM             Index       RIGHT ATRIUM           Index LA diam:        3.40 cm 1.54 cm/m  RA Area:     18.00 cm LA Vol (A2C):   63.9 ml 28.86 ml/m RA Volume:   43.70 ml  19.74 ml/m LA Vol (A4C):   66.6 ml 30.08 ml/m LA Biplane Vol: 65.5 ml 29.59 ml/m  AORTIC VALVE AV Area (Vmax):    1.36 cm AV Area (Vmean):   1.31 cm AV Area (VTI):     1.20 cm AV Vmax:           167.12 cm/s AV Vmean:          115.090 cm/s AV VTI:            0.296 m AV Peak Grad:      11.2  mmHg AV Mean Grad:      5.9 mmHg LVOT Vmax:         99.80 cm/s LVOT Vmean:        66.200 cm/s LVOT VTI:          0.156 m LVOT/AV VTI ratio: 0.53  AORTA Ao Root diam: 3.60 cm MITRAL VALVE MV Area (PHT): 3.85 cm     SHUNTS MV Decel Time: 197 msec     Systemic VTI:  0.16 m MV E velocity: 120.00 cm/s  Systemic Diam: 1.70 cm Carlyle Dolly MD Electronically signed by Carlyle Dolly MD Signature Date/Time: 01/24/2020/3:36:10 PM    Final     Orson Eva, DO  [facility]  If 7PM-7AM, please contact night-coverage [email or web address] Password [email or web address] 01/25/2020, 5:49 PM   LOS: 1 day

## 2020-01-25 NOTE — Progress Notes (Signed)
Physical Therapy Treatment Patient Details Name: Ann [name] MRN: [id number] DOB: [birth year] Today's Date: 01/25/2020    History of Present Illness Ann [name] is a 74 y.o. female with medical history significant of adrenal insufficiency, chronic use of steroids, anxiety, osteoarthritis, CAD, history of stent placement, history of lower extremity cellulitis, chronic back pain, chronic diastolic heart failure, chronic neck pain, chronic renal insufficiency, history of diverticulosis/diverticulitis, type 2 diabetes, diabetic polyneuropathy, history of left lower extremity DVT, erosive gastritis, GERD, glaucoma, hiatal hernia, hyperlipidemia, hypertension, hypokalemia, hypothyroidism, internal hemorrhoids, morbid obesity, rectal polyp, tubular adenoma:, Vitamin B12 deficiency who is coming to the emergency department due to waking up lightheaded since Friday associated with dyspnea, decreased appetite and nausea.  She is found to be hypoxic on arrival, but denies chest pain, palpitations, dizziness, diaphoresis, PND, orthopnea, but gets frequent lower extremity edema.  She denies fever, chills, sore throat or rhinorrhea.  No wheezing or hemoptysis.  No abdominal pain, nausea, emesis, diarrhea, constipation, melena or hematochezia.  No dysuria, frequency or hematuria.    PT Comments    Pt able to come to sitting EOB with reports of feeling "swimmy headed" but resolves with time. Pt with HR 106-122bpm with sitting EOB and while performing seated exercises. Pt able to stand with bedrail to assist in powering up to standing, HR elevated to 132bpm so returned to sitting. Pt on 3.5 L O2 with SpO2 >92% and mild SOB noted with activity. Pt assisted back to supine due to no chair in room with spouse at bedside at [place]. RN notified of HR with mobility. Patient will benefit from continued physical therapy in hospital and recommendations below to increase strength, balance, endurance for safe ADLs and gait.     Follow Up Recommendations  Home health PT;Supervision - Intermittent     Equipment Recommendations  None recommended by PT    Recommendations for Other Services       Precautions / Restrictions Precautions Precautions: Fall Restrictions Weight Bearing Restrictions: No    Mobility  Bed Mobility Overal bed mobility: Modified Independent  General bed mobility comments: HOB raised, increased time, use of bedrail to pull self up and scoot to EOB  Transfers Overall transfer level: Needs assistance Equipment used: None Transfers: Sit to/from Stand Sit to Stand: Supervision  General transfer comment: pt rises from EOB using bedrail to help power up  Ambulation/Gait  General Gait Details: unable due to elevated HR   Stairs             Wheelchair Mobility    Modified Rankin (Stroke Patients Only)       Balance Overall balance assessment: Needs assistance Sitting-balance support: No upper extremity supported;Feet supported Sitting balance-Leahy Scale: Good Sitting balance - Comments: seated EOB   Standing balance support: During functional activity;No upper extremity supported Standing balance-Leahy Scale: Fair Standing balance comment: static standing       Cognition Arousal/Alertness: Awake/alert Behavior During Therapy: WFL for tasks assessed/performed Overall Cognitive Status: Within Functional Limits for tasks assessed         Exercises General Exercises - Lower Extremity Long Arc Quad: AROM;Strengthening;Both;15 reps;Seated    General Comments General comments (skin integrity, edema, etc.): pt HR seated EOB 106-122 bpm, static standing HR 132 bpm, on 3.5 L O2 with SpO2 >92%.      Pertinent Vitals/Pain Pain Assessment: 0-10 Pain Score: 8  Pain Location: R quad Pain Descriptors / Indicators: Sore Pain Intervention(s): Limited activity within patient's tolerance;Monitored during session;Repositioned  Home Living                       Prior Function            PT Goals (current goals can now be found in the care plan section) Acute Rehab PT Goals Patient Stated Goal: return home with family to assist PT Goal Formulation: With patient Time For Goal Achievement: 01/28/20 Progress towards PT goals: Not progressing toward goals - comment (limited by HR this session)    Frequency    Min 3X/week      PT Plan Current plan remains appropriate    Co-evaluation              AM-PAC PT "6 Clicks" Mobility   Outcome Measure  Help needed turning from your back to your side while in a flat bed without using bedrails?: None Help needed moving from lying on your back to sitting on the side of a flat bed without using bedrails?: A Little Help needed moving to and from a bed to a chair (including a wheelchair)?: A Little Help needed standing up from a chair using your arms (e.g., wheelchair or bedside chair)?: None Help needed to walk in hospital room?: A Little Help needed climbing 3-5 steps with a railing? : A Lot 6 Click Score: 19    End of Session Equipment Utilized During Treatment: Oxygen Activity Tolerance: Patient tolerated treatment well;Patient limited by fatigue;Other (comment) (limited by HR) Patient left: in bed;with call bell/phone within reach;with bed alarm set;with family/visitor present Nurse Communication: Mobility status;Other (comment) (HR with mobility) PT Visit Diagnosis: Unsteadiness on feet (R26.81);Other abnormalities of gait and mobility (R26.89);Muscle weakness (generalized) (M62.81)     Time: 2763-9432 PT Time Calculation (min) (ACUTE ONLY): 19 min  Charges:  $Therapeutic Activity: 8-22 mins                      Tori Mliss Wedin PT, DPT 01/25/20, 12:19 PM [phone]

## 2020-01-25 NOTE — TOC Progression Note (Signed)
Transition of Care [facility]) - Progression Note    Patient Details  Name: Ann [name] MRN: [id number] Date of Birth: Jan 25, 1946  Transition of Care [facility]) CM/SW Contact  Salome Arnt, [place] Phone Number: 01/25/2020, 2:24 PM  Clinical Narrative: LCSW discussed recommendation for home health and pt requests [facility] as she has had them in the past. She would also like aid for a bath. Linda with [facility] notified. Will will need home health PT/aide orders.     Expected Discharge Plan: Home/Self Care Barriers to Discharge: Continued Medical Work up  Expected Discharge Plan and Services Expected Discharge Plan: Home/Self Care In-house Referral: Clinical Social Work     Living arrangements for the past 2 months: Single Family Home                                       Social Determinants of Health (SDOH) Interventions    Readmission Risk Interventions Readmission Risk Prevention Plan 01/24/2020  Transportation Screening Complete  HRI or [place] Complete  Social Work Consult for [place] Planning/Counseling Complete  Palliative Care Screening Not Applicable  Medication Review [employer]) Complete  Some recent data might be hidden

## 2020-01-26 DIAGNOSIS — J449 Chronic obstructive pulmonary disease, unspecified: Secondary | ICD-10-CM

## 2020-01-26 DIAGNOSIS — E1121 Type 2 diabetes mellitus with diabetic nephropathy: Secondary | ICD-10-CM

## 2020-01-26 DIAGNOSIS — Z794 Long term (current) use of insulin: Secondary | ICD-10-CM

## 2020-01-26 DIAGNOSIS — E039 Hypothyroidism, unspecified: Secondary | ICD-10-CM

## 2020-01-26 LAB — BASIC METABOLIC PANEL
Anion gap: 12 (ref 5–15)
BUN: 29 mg/dL — ABNORMAL HIGH (ref 8–23)
CO2: 29 mmol/L (ref 22–32)
Calcium: 9.7 mg/dL (ref 8.9–10.3)
Chloride: 97 mmol/L — ABNORMAL LOW (ref 98–111)
Creatinine, Ser: 0.88 mg/dL (ref 0.44–1.00)
GFR calc Af Amer: >60 mL/min (ref >60)
GFR calc non Af Amer: >60 mL/min (ref >60)
Glucose, Bld: 176 mg/dL — ABNORMAL HIGH (ref 70–99)
Potassium: 4.2 mmol/L (ref 3.5–5.1)
Sodium: 138 mmol/L (ref 135–145)

## 2020-01-26 LAB — GLUCOSE, CAPILLARY
Glucose-Capillary: 169 mg/dL — ABNORMAL HIGH (ref 70–99)
Glucose-Capillary: 220 mg/dL — ABNORMAL HIGH (ref 70–99)

## 2020-01-26 NOTE — Progress Notes (Signed)
SATURATION QUALIFICATIONS: (This note is used to comply with regulatory documentation for home oxygen)  Patient Saturations on Room Air at Rest = 90%  Patient Saturations on Room Air while Ambulating =81% sats rebounded to 92% 1 min after sitting down  Please briefly explain why patient needs home oxygen: Pt became short of breath with ambulation of approx 20 ft - has worn O2 at home previously

## 2020-01-26 NOTE — Progress Notes (Addendum)
Physical Therapy Treatment Patient Details Name: Ann [name] MRN: [id number] DOB: May 09, 1946 Today's Date: 01/26/2020    History of Present Illness Ann [name] is a 74 y.o. female with medical history significant of adrenal insufficiency, chronic use of steroids, anxiety, osteoarthritis, CAD, history of stent placement, history of lower extremity cellulitis, chronic back pain, chronic diastolic heart failure, chronic neck pain, chronic renal insufficiency, history of diverticulosis/diverticulitis, type 2 diabetes, diabetic polyneuropathy, history of left lower extremity DVT, erosive gastritis, GERD, glaucoma, hiatal hernia, hyperlipidemia, hypertension, hypokalemia, hypothyroidism, internal hemorrhoids, morbid obesity, rectal polyp, tubular adenoma:, Vitamin B12 deficiency who is coming to the emergency department due to waking up lightheaded since Friday associated with dyspnea, decreased appetite and nausea.  She is found to be hypoxic on arrival, but denies chest pain, palpitations, dizziness, diaphoresis, PND, orthopnea, but gets frequent lower extremity edema.  She denies fever, chills, sore throat or rhinorrhea.  No wheezing or hemoptysis.  No abdominal pain, nausea, emesis, diarrhea, constipation, melena or hematochezia.  No dysuria, frequency or hematuria.    PT Comments    The patient's O2 sat RA was 92% at rest and 96% during ambulation. The heart rate showed high variability, jumping anywhere from 35-86 throughout the session- RN notified. The patient was able to perform supine to sit modified independence, with heavy use of the bed rails for propulsion. She had mild dizziness upon sitting EOB which resolved within 30 seconds. The patient performed sit to stand transfer min guard and ambulated 30 feet RW min guard, limited by fatigue. The patient's O2 dropped to 85% during therapeutic exercise, but promptly rose back up to 92% after the session. Patient tolerated sitting up in chair  after therapy- RN notified. PLAN: The patient will continue to benefit from skilled physical therapy services in hospital at recommended venue below in order to improve balance, gait, and ADL's to promote independence in functional activities.     Follow Up Recommendations  Home health PT;Supervision - Intermittent     Equipment Recommendations  None recommended by PT    Recommendations for Other Services       Precautions / Restrictions Precautions Precautions: Fall Restrictions Weight Bearing Restrictions: No    Mobility  Bed Mobility Overal bed mobility: Needs Assistance Bed Mobility: Supine to Sit     Supine to sit: Supervision     General bed mobility comments: HOB raised, increased time, use of bedrail to pull self up and scoot to EOB  Transfers Overall transfer level: Needs assistance Equipment used: None;Rolling walker (2 wheeled) Transfers: Sit to/from Stand Sit to Stand: Min guard         General transfer comment: pt rises from EOB using bedrail to help power up, VC's to stand up straight w RW  Ambulation/Gait Ambulation/Gait assistance: Min guard Gait Distance (Feet): 30 Feet Assistive device: Rolling walker (2 wheeled) Gait Pattern/deviations: Decreased step length - right;Decreased step length - left;Decreased stride length;Trunk flexed;Step-through pattern Gait velocity: decreased   General Gait Details: pt reported fatigue even though O2 was 96% during ambulation, pt returned to room   Stairs             Wheelchair Mobility    Modified Rankin (Stroke Patients Only)       Balance Overall balance assessment: Needs assistance Sitting-balance support: No upper extremity supported;Feet supported Sitting balance-Leahy Scale: Good Sitting balance - Comments: seated EOB   Standing balance support: During functional activity;Bilateral upper extremity supported Standing balance-Leahy Scale: Good Standing balance comment: reliant  on RW for  weight acceptance                            Cognition Arousal/Alertness: Awake/alert Behavior During Therapy: WFL for tasks assessed/performed Overall Cognitive Status: Within Functional Limits for tasks assessed                                        Exercises General Exercises - Lower Extremity Ankle Circles/Pumps: AROM;Seated;Strengthening;Both;10 reps Long Arc Quad: AROM;Strengthening;Both;Seated;10 reps Hip Flexion/Marching: AROM;Strengthening;Seated;Both;10 reps    General Comments        Pertinent Vitals/Pain Pain Assessment: Faces Faces Pain Scale: Hurts a little bit Pain Location: R quad Pain Descriptors / Indicators: Sore Pain Intervention(s): Limited activity within patient's tolerance;Monitored during session    Home Living                      Prior Function            PT Goals (current goals can now be found in the care plan section) Acute Rehab PT Goals Patient Stated Goal: return home with family to assist PT Goal Formulation: With patient Time For Goal Achievement: 01/28/20 Progress towards PT goals: Progressing toward goals    Frequency    Min 3X/week      PT Plan Current plan remains appropriate    Co-evaluation              AM-PAC PT "6 Clicks" Mobility   Outcome Measure  Help needed turning from your back to your side while in a flat bed without using bedrails?: None Help needed moving from lying on your back to sitting on the side of a flat bed without using bedrails?: A Little Help needed moving to and from a bed to a chair (including a wheelchair)?: A Little Help needed standing up from a chair using your arms (e.g., wheelchair or bedside chair)?: A Little Help needed to walk in hospital room?: A Little Help needed climbing 3-5 steps with a railing? : A Lot 6 Click Score: 18    End of Session Equipment Utilized During Treatment: Gait belt Activity Tolerance: Patient tolerated treatment  well;Patient limited by fatigue Patient left: in chair;with call bell/phone within reach Nurse Communication: Mobility status;Other (comment) (O2 and HR status) PT Visit Diagnosis: Unsteadiness on feet (R26.81);Other abnormalities of gait and mobility (R26.89);Muscle weakness (generalized) (M62.81)     Time: 8502-7741 PT Time Calculation (min) (ACUTE ONLY): 25 min  Charges:  $Gait Training: 8-22 mins $Therapeutic Exercise: 8-22 mins                     3:50 PM , 01/26/20 Karlyn Agee, SPT Physical Therapy with [facility] [phone] office  During this treatment session, the therapist was present, participating in and directing the treatment.  3:50 PM, 01/26/20 Lonell Grandchild, MPT Physical Therapist with [facility] [phone] office [phone] mobile phone

## 2020-01-26 NOTE — Discharge Summary (Signed)
Physician Discharge Summary  Ann [name]:[id number] DOB: 08/14/1945 DOA: 01/23/2020  PCP: Rosita Fire, MD  Admit date: 01/23/2020 Discharge date: 01/26/2020  Time spent: 35 minutes  Recommendations for Outpatient Follow-up:  Reassess patient volume status and blood pressure with further adjustment medication as needed. Repeat basic metabolic panel to follow twice renal function Continue close monitoring of patient's CBGs/A1c with further adjustment to his hypoglycemic regimen as needed.  Discharge Diagnoses:  Principal Problem:   Atrial fibrillation with RVR ([place]) Active Problems:   Acquired hypothyroidism   Mixed hyperlipidemia   Coronary atherosclerosis   Adrenal insufficiency (HCC)   Obesity, Class III, BMI 40-49.9 (morbid obesity) (HCC)   Chronic kidney disease, stage III (moderate)   Acute on chronic diastolic heart failure (HCC)   Microcytic anemia   GERD (gastroesophageal reflux disease)   Chronic obstructive pulmonary disease (HCC)   Anxiety   Type 2 diabetes mellitus (HCC)   Acute respiratory failure with hypoxia (HCC)   Acute on chronic diastolic CHF (congestive heart failure) ([place])   Discharge Condition: Stable and improved.  Patient discharged home with instructions to follow-up with PCP in 10 days and follow-up with cardiology service as an outpatient  CODE STATUS: Full code.  Diet recommendation: Heart healthy/low calorie modified carbohydrate diet.  Filed Weights   01/23/20 1542 01/25/20 0600 01/26/20 0418  Weight: 124.7 kg 124.1 kg 121.4 kg    History of present illness:  74 year oldfemale with a history of coronary disease, COPD, persistent atrial fibrillation on apixaban, nonsustained ventricular tachycardia, diastolic CHF, morbid obesity, adrenal insufficiency, diabetes mellitus type 2, hypertension, hyperlipidemia presenting with multiple nonspecific/vague complaints. Notably, the patient has been complaining of generalized weakness, nausea,  and decreased oral intake that began on 01/21/2020. She denies any new medications. She denies any fevers, chills, chest pain, palpitations, headache. She does complain of some dyspnea on exertion that has been gradually worsening over the past month. She denies any orthopnea or PND. She has an occasional nonproductive cough. She denies any vomiting, abdominal pain, dysuria, hematuria, diarrhea, hematochezia, melena. The patient endorsed compliance with all her medications. In addition, the patient also complains of right anterior thigh pain. She denies any recent injuries or trauma to her leg. She states that she was turning over in bed in the early morning of 01/14/2020 and it suddenly caused her to have pain. She was having some pain in the anterior right thigh with ambulation. She denies any back pain or actual hip pain. She denies any rashes, edema, erythema. Notably, the patient was recently admitted to the hospital from 12/10/2019 to 12/11/2019 for generalized weakness. It was felt that her adrenal insufficiency may have contributed to her symptoms. She was discharged home with prednisone 10 mg twice daily. Her prednisone was recently decreased back to 10 mg daily, although she is not able to tell me when exactly this occurred. In the emergency department, the patient was afebrile hemodynamically stable with oxygen saturation 85% on room air. She was placed on 2 L supplemental oxygen with oxygen saturation 95-96%. She was noted to have atrial fibrillation with RVR with heart rate 140-150. She was started on diltiazem drip. BMP, and CBC were essentially unremarkable with her serum creatinine and hemoglobin at baseline. BNP was 330. Chest x-ray showed increased interstitial markings and vascular congestion. Urinalysis was negative for pyuria.  Hospital Course:  Acute respiratory failure with hypoxia -Secondary to CHF in the setting of underlying COPD and OHS -Personally reviewed chest  x-ray--increased interstitial markings, vascular congestion  noted -stable on 2L -Patient oxygen evaluation in the demonstrating the need of oxygen supplementation especially on exertion (2 L nasal cannula).  Acute on chronic diastolic CHF -Patient with excellent response to IV diuresis -Stable renal function and electrolytes. -Diuretics has been adjusted for better symptom management -Patient encouraged to follow low sodium diet and to check weight on daily basis  Atrial fibrillation with RVR- -persistent, with rate control at discharge. -Continue oral diltiazem -Continue apixaban -07/14/2019 echo EF 55 to 60%, no WMA, increased LVEDP, trivial TR -01/24/20 echo--EF 60-65%, no WMA -01/06/2020 TSH 1.22  Adrenal insufficiency -Resume home prednisone dose.  Coronary artery disease -Denies any chest discomfort -Continue apixaban and Continue Lipitor -continue outpatient follow up with cardiology service.  CKD stage IIIa -Baseline creatinine 0.8-1.1 -Stable at baseline -Advised to maintain adequate hydration.  Diabetes mellitus type 2, insulin-dependent -Continue to closely follow CBG/A1c -Resume home hypoglycemic therapy -Patient advised to follow modified carbohydrate diet.  Hypothyroidism -Continue levothyroxine at current dose.  Right anterior thigh pain -Improved with the use of Lidoderm and acetaminophen while inpatient. -Continue physical therapy.  COPD -No acute wheezing appreciated -Normal respiratory effort and no using accessory muscle. -Continue home nebulizer/inhaler management.  Nausea -This is multifactorial including likely a component of diabetic gastroparesis, adrenal insufficiency, and atrial fibrillation with RVR -No further episode of nausea was reported; patient tolerating diet without difficulties. -Urinalysis was negative for signs of acute infection or pyuria. -Continue as needed antiemetics.  Hyperlipidemia -Continue statin -Heart  healthy diet has been encouraged.  Morbid obesity -Low calorie diet, increase physical activity and portion control has been discussed with patient -Body mass index is 47.41 kg/m.    Procedures:  See below for x-ray reports.  2D echo: Preserved ejection fraction, no wall motion normality, moderate left ventricle hypertrophy appreciated.  Consultations:  None   Discharge Exam: Vitals:   01/26/20 1330 01/26/20 1505  BP: 122/88 112/74  Pulse: 85 79  Resp: 16 20  Temp: 99.2 F (37.3 C) 98.9 F (37.2 C)  SpO2: 95%     General: Alert, awake and oriented x3; following commands appropriately, denies chest pain, no nausea, no vomiting, no orthopnea.  Still short of breath with activity during evaluation desaturating in the mid 80s without oxygen.  On 2 L of oxygen patient's O2 sats were maintained stable; no much desaturation appreciated while resting.  Patient reports good urine output Cardiovascular: Rate controlled, irregular, no rubs, no gallops; unable to properly assess JVD due to body habitus. Respiratory: Good air movement bilaterally, no wheezing, positive scattered rhonchi.  No using accessory muscle. Abdomen: Soft, nontender, obese, positive bowel sounds, no guarding Extremities: No cyanosis, no clubbing, no edema.  Discharge Instructions   Discharge Instructions    (HEART FAILURE PATIENTS) Call MD:  Anytime you have any of the following symptoms: 1) 3 pound weight gain in 24 hours or 5 pounds in 1 week 2) shortness of breath, with or without a dry hacking cough 3) swelling in the hands, feet or stomach 4) if you have to sleep on extra pillows at night in order to breathe.   Complete by: As directed    Amb referral to [facility]   Complete by: As directed    Diet - low sodium heart healthy   Complete by: As directed    Discharge instructions   Complete by: As directed    Medications are prescribed Follow low-sodium diet (less than 2 g daily) Check your weight on  daily basis and maintain  adequate hydration Arrange follow-up with PCP in 10 days Do not forget to use oxygen supplementation especially on exertion as prescribed Follow-up with cardiology service as instructed (office will contact you with appointment details).   Increase activity slowly   Complete by: As directed      Allergies as of 01/26/2020      Reactions   Ace Inhibitors Other (See Comments)   unknown   Asa [aspirin]    Causes bleeding   Tape Other (See Comments)   Skin tearing, causes scars   Niacin Rash   Reglan [metoclopramide] Anxiety      Medication List    TAKE these medications   acetaminophen 325 MG tablet Commonly known as: TYLENOL Take 2 tablets (650 mg total) by mouth every 6 (six) hours as needed for mild pain, moderate pain or fever.   albuterol 108 (90 Base) MCG/ACT inhaler Commonly known as: VENTOLIN HFA Inhale 2 puffs into the lungs every 4 (four) hours as needed for wheezing.   apixaban 5 MG Tabs tablet Commonly known as: ELIQUIS Take 1 tablet (5 mg total) by mouth 2 (two) times daily.   atorvastatin 80 MG tablet Commonly known as: LIPITOR Take 1 tablet (80 mg total) by mouth daily at 6 PM.   cyanocobalamin 1000 MCG tablet Take 1 tablet (1,000 mcg total) by mouth daily.   diltiazem 240 MG 24 hr capsule Commonly known as: CARDIZEM CD Take 1 capsule (240 mg total) by mouth daily.   Docusate Sodium 100 MG capsule Take 100 mg by mouth daily.   furosemide 40 MG tablet Commonly known as: LASIX Take 1 tablet (40 mg total) by mouth daily.   gabapentin 400 MG capsule Commonly known as: NEURONTIN Take 400 mg by mouth 3 (three) times daily.   hydrALAZINE 50 MG tablet Commonly known as: APRESOLINE Take 1 tablet (50 mg total) by mouth 3 (three) times daily.   Lancets Misc Used 2 daily   Levemir FlexTouch 100 UNIT/ML FlexPen Generic drug: insulin detemir Inject 30-50 Units into the skin 2 (two) times daily.   levothyroxine 150 MCG  tablet Commonly known as: SYNTHROID Take 1 tablet (150 mcg total) by mouth daily before breakfast.   Myrbetriq 50 MG Tb24 tablet Generic drug: mirabegron ER Take 50 mg by mouth daily.   nitroGLYCERIN 0.4 MG SL tablet Commonly known as: NITROSTAT Place 1 tablet (0.4 mg total) under the tongue every 5 (five) minutes as needed for chest pain.   pantoprazole 40 MG tablet Commonly known as: Protonix 1 PO 30 MINUTES PRIOR TO MEALS BID What changed:   how much to take  how to take this  when to take this  additional instructions   Pen Needles 31G X 5 MM Misc 1 each by Does not apply route 2 (two) times daily. Use two times daily to inject insulin   predniSONE 10 MG tablet Commonly known as: DELTASONE Take 1 tablet (10 mg total) by mouth daily with breakfast.   Refresh Plus 0.5 % Soln Generic drug: Carboxymethylcellulose Sod PF Place 2 drops into both eyes daily as needed (for dry eye relief).   sertraline 100 MG tablet Commonly known as: ZOLOFT Take 0.5 tablets (50 mg total) by mouth at bedtime.   solifenacin 10 MG tablet Commonly known as: VESICARE Take 10 mg by mouth at bedtime.   Spiriva Respimat 2.5 MCG/ACT Aers Generic drug: Tiotropium Bromide Monohydrate Inhale 2 puffs into the lungs daily.   SUMAtriptan 100 MG tablet Commonly known as: IMITREX  Take 100 mg by mouth every 2 (two) hours as needed.   Vitamin D3 125 MCG (5000 UT) Caps Take 1 capsule (5,000 Units total) by mouth daily.            Durable Medical Equipment  (From admission, onward)         Start     Ordered   01/26/20 1454  For home use only DME Hospital bed  Once       Question Answer Comment  Length of Need 12 Months   Patient has (list medical condition): hx of A. fib, CHF and GERD   The above medical condition requires: Patient requires the ability to reposition frequently   Head must be elevated greater than: 30 degrees   Bed type Semi-electric   Support Surface: Gel Overlay       01/26/20 1457   01/26/20 1207  For home use only DME oxygen  Once       Question Answer Comment  Length of Need 12 Months   Mode or (Route) Nasal cannula   Liters per Minute 2   Frequency Continuous (stationary and portable oxygen unit needed)   Oxygen conserving device Yes   Oxygen delivery system Gas      01/26/20 1207         Allergies  Allergen Reactions  . Ace Inhibitors Other (See Comments)    unknown  . Asa [Aspirin]     Causes bleeding  . Tape Other (See Comments)    Skin tearing, causes scars  . Niacin Rash  . Reglan [Metoclopramide] Anxiety    Follow-up Information    [facility] Follow [facility].   Specialty: Home Health Services Why: Will call you to schedule visit after discharge.        Rosita Fire, MD. Schedule an appointment as soon as possible for a visit in 10 day(s).   Specialty: Internal Medicine Contact information: [place] [id number] [phone]        Herminio Commons, MD .   Specialty: Cardiology Contact information: [place] [id number] [phone]               The results of significant diagnostics from this hospitalization (including imaging, microbiology, ancillary and laboratory) are listed below for reference.    Significant Diagnostic Studies: DG Chest Portable 1 View  Result Date: 01/23/2020 CLINICAL DATA:  Hypoxia and shortness of breath. EXAM: PORTABLE CHEST 1 VIEW COMPARISON:  December 10, 2019 FINDINGS: Mildly decreased lung volumes are seen which is likely secondary to the degree of patient inspiration. Mild, diffuse, chronic appearing increased interstitial lung markings are noted. Mild, stable linear scarring and/or atelectasis is seen within the mid left lung. There is no evidence of a pleural effusion or pneumothorax. The cardiac silhouette is moderately enlarged. A radiopaque fusion plate and screws are seen overlying the lower cervical spine. The  visualized skeletal structures are otherwise unremarkable. IMPRESSION: Mild, diffuse, chronic appearing increased interstitial lung markings with mild, stable mid left lung linear scarring and/or atelectasis. Electronically Signed   By: Virgina Norfolk M.D.   On: 01/23/2020 16:06   ECHOCARDIOGRAM COMPLETE  Result Date: 01/24/2020    ECHOCARDIOGRAM REPORT   Patient Name:   Ann [name] Date of Exam: 01/24/2020 Medical Rec #:  [id number]        Height:       63.0 in Accession #:    [id number]  Weight:       275.0 lb Date of Birth:  1945/12/31         BSA:          2.214 m Patient Age:    46 years         BP:           102/61 mmHg Patient Gender: F                HR:           79 bpm. Exam Location:  [facility] Procedure: 2D Echo, Cardiac Doppler and Color Doppler Indications:    CHF-Acute Diastolic 263.78 / H88.50  History:        Patient has prior history of Echocardiogram examinations, most                 recent 07/14/2019. CHF, Arrythmias:Atrial Fibrillation; Risk                 Factors:Diabetes, Dyslipidemia, Former Smoker and Hypertension.                 Chronic kidney disease, stage III (moderate), Morbid obesity.  Sonographer:    Alvino Chapel RCS Referring Phys: [phone] DAVID TAT IMPRESSIONS  1. Left ventricular ejection fraction, by estimation, is 60 to 65%. The left ventricle has normal function. The left ventricle has no regional wall motion abnormalities. There is moderate left ventricular hypertrophy. Left ventricular diastolic parameters are indeterminate.  2. Right ventricular systolic function is normal. The right ventricular size is normal.  3. Left atrial size was mildly dilated.  4. The mitral valve is normal in structure. No evidence of mitral valve regurgitation. No evidence of mitral stenosis.  5. The aortic valve has an indeterminant number of cusps. Aortic valve regurgitation is not visualized. No aortic stenosis is present.  6. The inferior vena cava is normal in size with greater  than 50% respiratory variability, suggesting right atrial pressure of 3 mmHg. FINDINGS  Left Ventricle: Left ventricular ejection fraction, by estimation, is 60 to 65%. The left ventricle has normal function. The left ventricle has no regional wall motion abnormalities. The left ventricular internal cavity size was normal in size. There is  moderate left ventricular hypertrophy. Left ventricular diastolic parameters are indeterminate. Right Ventricle: The right ventricular size is normal. No increase in right ventricular wall thickness. Right ventricular systolic function is normal. Left Atrium: Left atrial size was mildly dilated. Right Atrium: Right atrial size was normal in size. Pericardium: A small pericardial effusion is present. The pericardial effusion is circumferential. Mitral Valve: The mitral valve is normal in structure. No evidence of mitral valve regurgitation. No evidence of mitral valve stenosis. Tricuspid Valve: The tricuspid valve is normal in structure. Tricuspid valve regurgitation is not demonstrated. No evidence of tricuspid stenosis. Aortic Valve: The aortic valve has an indeterminant number of cusps. Aortic valve regurgitation is not visualized. No aortic stenosis is present. Aortic valve mean gradient measures 5.9 mmHg. Aortic valve peak gradient measures 11.2 mmHg. Aortic valve area, by VTI measures 1.20 cm. Pulmonic Valve: The pulmonic valve was not well visualized. Pulmonic valve regurgitation is not visualized. No evidence of pulmonic stenosis. Aorta: The aortic root is normal in size and structure. Venous: The inferior vena cava is normal in size with greater than 50% respiratory variability, suggesting right atrial pressure of 3 mmHg. IAS/Shunts: No atrial level shunt detected by color flow Doppler.  LEFT VENTRICLE PLAX 2D LVIDd:  4.80 cm LVIDs:         2.99 cm LV PW:         1.51 cm LV IVS:        1.31 cm LVOT diam:     1.70 cm LV SV:         35 LV SV Index:   16 LVOT Area:      2.27 cm  RIGHT VENTRICLE TAPSE (M-mode): 1.8 cm LEFT ATRIUM             Index       RIGHT ATRIUM           Index LA diam:        3.40 cm 1.54 cm/m  RA Area:     18.00 cm LA Vol (A2C):   63.9 ml 28.86 ml/m RA Volume:   43.70 ml  19.74 ml/m LA Vol (A4C):   66.6 ml 30.08 ml/m LA Biplane Vol: 65.5 ml 29.59 ml/m  AORTIC VALVE AV Area (Vmax):    1.36 cm AV Area (Vmean):   1.31 cm AV Area (VTI):     1.20 cm AV Vmax:           167.12 cm/s AV Vmean:          115.090 cm/s AV VTI:            0.296 m AV Peak Grad:      11.2 mmHg AV Mean Grad:      5.9 mmHg LVOT Vmax:         99.80 cm/s LVOT Vmean:        66.200 cm/s LVOT VTI:          0.156 m LVOT/AV VTI ratio: 0.53  AORTA Ao Root diam: 3.60 cm MITRAL VALVE MV Area (PHT): 3.85 cm     SHUNTS MV Decel Time: 197 msec     Systemic VTI:  0.16 m MV E velocity: 120.00 cm/s  Systemic Diam: 1.70 cm Carlyle Dolly MD Electronically signed by Carlyle Dolly MD Signature Date/Time: 01/24/2020/3:36:10 PM    Final     Microbiology: Recent Results (from the past 240 hour(s))  SARS Coronavirus 2 by RT PCR (hospital order, performed in [place] hospital lab) Nasopharyngeal Nasopharyngeal Swab     Status: None   Collection Time: 01/23/20  3:57 PM   Specimen: Nasopharyngeal Swab  Result Value Ref Range Status   SARS Coronavirus 2 NEGATIVE NEGATIVE Final    Comment: (NOTE) SARS-CoV-2 target nucleic acids are NOT DETECTED.  The SARS-CoV-2 RNA is generally detectable in upper and lower respiratory specimens during the acute phase of infection. The lowest concentration of SARS-CoV-2 viral copies this assay can detect is 250 copies / mL. A negative result does not preclude SARS-CoV-2 infection and should not be used as the sole basis for treatment or other patient management decisions.  A negative result may occur with improper specimen collection / handling, submission of specimen other than nasopharyngeal swab, presence of viral mutation(s) within the areas  targeted by this assay, and inadequate number of viral copies (<250 copies / mL). A negative result must be combined with clinical observations, patient history, and epidemiological information.  Fact Sheet for Patients:   StrictlyIdeas.no  Fact Sheet for Healthcare Providers: BankingDealers.co.za  This test is not yet approved or  cleared by the [place] FDA and has been authorized for detection and/or diagnosis of SARS-CoV-2 by FDA under an Emergency Use Authorization (EUA).  This EUA will remain in effect (  meaning this test can be used) for the duration of the COVID-19 declaration under Section 564(b)(1) of the Act, 21 U.S.C. section 360bbb-3(b)(1), unless the authorization is terminated or revoked sooner.  Performed at [facility], [address] [place], [place] [address]      Labs: Basic Metabolic Panel: Recent Labs  Lab 01/23/20 1600 01/24/20 0942 01/25/20 0637 01/26/20 0553  NA 139 140 138 138  K 4.0 4.0 4.5 4.2  CL 99 102 99 97*  CO2 28 28 30 29   GLUCOSE 116* 158* 189* 176*  BUN 19 15 20  29*  CREATININE 1.11* 0.84 0.96 0.88  CALCIUM 9.0 8.4* 9.3 9.7  MG 2.0  --  2.2  --    CBC: Recent Labs  Lab 01/23/20 1600 01/24/20 0942  WBC 8.0 8.0  HGB 10.7* 10.6*  HCT 37.2 37.2  MCV 82.9 83.4  PLT 300 284    BNP (last 3 results) Recent Labs    07/13/19 1257 12/10/19 1355 01/23/20 1600  BNP 229.0* 268.0* 330.0*   CBG: Recent Labs  Lab 01/25/20 0750 01/25/20 1137 01/25/20 1609 01/25/20 2031 01/26/20 0804  GLUCAP 180* 205* 263* 276* 169*    Signed:  Barton Dubois MD.  [facility] 01/26/2020, 3:12 PM

## 2020-01-26 NOTE — TOC Transition Note (Signed)
Transition of Care [facility]) - CM/SW Discharge Note   Patient Details  Name: Ann [name] MRN: [id number] Date of Birth: 05-26-1946  Transition of Care [facility] CM/SW Contact:  Natasha Bence, LCSW Phone Number: 01/26/2020, 3:09 PM   Clinical Narrative:    CSW was consulted by [facility] representative requesting new order for hospital bed. Patient reported to [facility] with [facility] that they would need a new hospital bed. CSW notified MD of new order needed for hospital bed. CSW also received order for patient's O2. CSW notified rodney with [facility] of patient's need for 2L of O2. Barbaraann Rondo agreeable to provide O2. CSW notified Vaughan Basta with [facility] of patient's discharge and need for [facility] and aid. Vaughan Basta agreeable to provide services for patient. TOC signing off.      Barriers to Discharge: Continued Medical Work up   Patient Goals and CMS Choice Patient states their goals for this hospitalization and ongoing recovery are:: return home      Discharge Placement                       Discharge Plan and Services In-house Referral: Clinical Social Work                                   Social Determinants of Health (SDOH) Interventions     Readmission Risk Interventions Readmission Risk Prevention Plan 01/24/2020  Transportation Screening Complete  HRI or [place] Complete  Social Work Consult for [place] Planning/Counseling Complete  Palliative Care Screening Not Applicable  Medication Review [employer]) Complete  Some recent data might be hidden

## 2020-01-26 NOTE — Care Management Important Message (Signed)
Important Message  Patient Details  Name: Ann [name] MRN: [id number] Date of Birth: May 09, 1946   Medicare Important Message Given:  Yes     Tommy Medal 01/26/2020, 4:30 PM

## 2020-01-27 DIAGNOSIS — I2510 Atherosclerotic heart disease of native coronary artery without angina pectoris: Secondary | ICD-10-CM | POA: Diagnosis not present

## 2020-01-27 DIAGNOSIS — H409 Unspecified glaucoma: Secondary | ICD-10-CM | POA: Diagnosis not present

## 2020-01-27 DIAGNOSIS — K449 Diaphragmatic hernia without obstruction or gangrene: Secondary | ICD-10-CM | POA: Diagnosis not present

## 2020-01-27 DIAGNOSIS — I130 Hypertensive heart and chronic kidney disease with heart failure and stage 1 through stage 4 chronic kidney disease, or unspecified chronic kidney disease: Secondary | ICD-10-CM | POA: Diagnosis not present

## 2020-01-27 DIAGNOSIS — E1122 Type 2 diabetes mellitus with diabetic chronic kidney disease: Secondary | ICD-10-CM | POA: Diagnosis not present

## 2020-01-27 DIAGNOSIS — M1990 Unspecified osteoarthritis, unspecified site: Secondary | ICD-10-CM | POA: Diagnosis not present

## 2020-01-27 DIAGNOSIS — F419 Anxiety disorder, unspecified: Secondary | ICD-10-CM | POA: Diagnosis not present

## 2020-01-27 DIAGNOSIS — Z6841 Body Mass Index (BMI) 40.0 and over, adult: Secondary | ICD-10-CM | POA: Diagnosis not present

## 2020-01-27 DIAGNOSIS — D509 Iron deficiency anemia, unspecified: Secondary | ICD-10-CM | POA: Diagnosis not present

## 2020-01-27 DIAGNOSIS — M549 Dorsalgia, unspecified: Secondary | ICD-10-CM | POA: Diagnosis not present

## 2020-01-27 DIAGNOSIS — E1142 Type 2 diabetes mellitus with diabetic polyneuropathy: Secondary | ICD-10-CM | POA: Diagnosis not present

## 2020-01-27 DIAGNOSIS — I471 Supraventricular tachycardia: Secondary | ICD-10-CM | POA: Diagnosis not present

## 2020-01-27 DIAGNOSIS — N183 Chronic kidney disease, stage 3 unspecified: Secondary | ICD-10-CM | POA: Diagnosis not present

## 2020-01-27 DIAGNOSIS — K5790 Diverticulosis of intestine, part unspecified, without perforation or abscess without bleeding: Secondary | ICD-10-CM | POA: Diagnosis not present

## 2020-01-27 DIAGNOSIS — K219 Gastro-esophageal reflux disease without esophagitis: Secondary | ICD-10-CM | POA: Diagnosis not present

## 2020-01-27 DIAGNOSIS — I4819 Other persistent atrial fibrillation: Secondary | ICD-10-CM | POA: Diagnosis not present

## 2020-01-27 DIAGNOSIS — E039 Hypothyroidism, unspecified: Secondary | ICD-10-CM | POA: Diagnosis not present

## 2020-01-27 DIAGNOSIS — J449 Chronic obstructive pulmonary disease, unspecified: Secondary | ICD-10-CM | POA: Diagnosis not present

## 2020-01-27 DIAGNOSIS — E538 Deficiency of other specified B group vitamins: Secondary | ICD-10-CM | POA: Diagnosis not present

## 2020-01-27 DIAGNOSIS — E2740 Unspecified adrenocortical insufficiency: Secondary | ICD-10-CM | POA: Diagnosis not present

## 2020-01-27 DIAGNOSIS — G8929 Other chronic pain: Secondary | ICD-10-CM | POA: Diagnosis not present

## 2020-01-27 DIAGNOSIS — I5033 Acute on chronic diastolic (congestive) heart failure: Secondary | ICD-10-CM | POA: Diagnosis not present

## 2020-01-27 DIAGNOSIS — E782 Mixed hyperlipidemia: Secondary | ICD-10-CM | POA: Diagnosis not present

## 2020-01-29 DIAGNOSIS — N183 Chronic kidney disease, stage 3 unspecified: Secondary | ICD-10-CM | POA: Diagnosis not present

## 2020-01-29 DIAGNOSIS — J449 Chronic obstructive pulmonary disease, unspecified: Secondary | ICD-10-CM | POA: Diagnosis not present

## 2020-01-29 DIAGNOSIS — I5033 Acute on chronic diastolic (congestive) heart failure: Secondary | ICD-10-CM | POA: Diagnosis not present

## 2020-01-29 DIAGNOSIS — I2510 Atherosclerotic heart disease of native coronary artery without angina pectoris: Secondary | ICD-10-CM | POA: Diagnosis not present

## 2020-01-29 DIAGNOSIS — E1122 Type 2 diabetes mellitus with diabetic chronic kidney disease: Secondary | ICD-10-CM | POA: Diagnosis not present

## 2020-01-29 DIAGNOSIS — I130 Hypertensive heart and chronic kidney disease with heart failure and stage 1 through stage 4 chronic kidney disease, or unspecified chronic kidney disease: Secondary | ICD-10-CM | POA: Diagnosis not present

## 2020-02-01 ENCOUNTER — Other Ambulatory Visit: Payer: Self-pay

## 2020-02-01 DIAGNOSIS — I5033 Acute on chronic diastolic (congestive) heart failure: Secondary | ICD-10-CM | POA: Diagnosis not present

## 2020-02-01 DIAGNOSIS — I2510 Atherosclerotic heart disease of native coronary artery without angina pectoris: Secondary | ICD-10-CM | POA: Diagnosis not present

## 2020-02-01 DIAGNOSIS — E1122 Type 2 diabetes mellitus with diabetic chronic kidney disease: Secondary | ICD-10-CM | POA: Diagnosis not present

## 2020-02-01 DIAGNOSIS — J449 Chronic obstructive pulmonary disease, unspecified: Secondary | ICD-10-CM | POA: Diagnosis not present

## 2020-02-01 DIAGNOSIS — I130 Hypertensive heart and chronic kidney disease with heart failure and stage 1 through stage 4 chronic kidney disease, or unspecified chronic kidney disease: Secondary | ICD-10-CM | POA: Diagnosis not present

## 2020-02-01 DIAGNOSIS — N183 Chronic kidney disease, stage 3 unspecified: Secondary | ICD-10-CM | POA: Diagnosis not present

## 2020-02-01 NOTE — Patient Outreach (Signed)
[facility]) Care Management  02/01/2020  Ann [name] 1945/08/30 [id number]   Red Emmi: Date of call:  01/28/2020 Reason for alert:  Scheduled follow up- no   Placed call to patient who identified herself. Reviewed reason for call. Patient reports to me that she has her follow up scheduled. Denies any other needs at this time.  PLAN: close case as no needs identified.  Tomasa Rand, RN, BSN, CEN [facility] [employer] [phone]

## 2020-02-02 DIAGNOSIS — I2510 Atherosclerotic heart disease of native coronary artery without angina pectoris: Secondary | ICD-10-CM | POA: Diagnosis not present

## 2020-02-02 DIAGNOSIS — N183 Chronic kidney disease, stage 3 unspecified: Secondary | ICD-10-CM | POA: Diagnosis not present

## 2020-02-02 DIAGNOSIS — J449 Chronic obstructive pulmonary disease, unspecified: Secondary | ICD-10-CM | POA: Diagnosis not present

## 2020-02-02 DIAGNOSIS — I5033 Acute on chronic diastolic (congestive) heart failure: Secondary | ICD-10-CM | POA: Diagnosis not present

## 2020-02-02 DIAGNOSIS — I130 Hypertensive heart and chronic kidney disease with heart failure and stage 1 through stage 4 chronic kidney disease, or unspecified chronic kidney disease: Secondary | ICD-10-CM | POA: Diagnosis not present

## 2020-02-02 DIAGNOSIS — E1122 Type 2 diabetes mellitus with diabetic chronic kidney disease: Secondary | ICD-10-CM | POA: Diagnosis not present

## 2020-02-03 DIAGNOSIS — E1122 Type 2 diabetes mellitus with diabetic chronic kidney disease: Secondary | ICD-10-CM | POA: Diagnosis not present

## 2020-02-03 DIAGNOSIS — I5033 Acute on chronic diastolic (congestive) heart failure: Secondary | ICD-10-CM | POA: Diagnosis not present

## 2020-02-03 DIAGNOSIS — N183 Chronic kidney disease, stage 3 unspecified: Secondary | ICD-10-CM | POA: Diagnosis not present

## 2020-02-03 DIAGNOSIS — I130 Hypertensive heart and chronic kidney disease with heart failure and stage 1 through stage 4 chronic kidney disease, or unspecified chronic kidney disease: Secondary | ICD-10-CM | POA: Diagnosis not present

## 2020-02-03 DIAGNOSIS — J449 Chronic obstructive pulmonary disease, unspecified: Secondary | ICD-10-CM | POA: Diagnosis not present

## 2020-02-03 DIAGNOSIS — I2510 Atherosclerotic heart disease of native coronary artery without angina pectoris: Secondary | ICD-10-CM | POA: Diagnosis not present

## 2020-02-04 DIAGNOSIS — J449 Chronic obstructive pulmonary disease, unspecified: Secondary | ICD-10-CM | POA: Diagnosis not present

## 2020-02-04 DIAGNOSIS — Z0001 Encounter for general adult medical examination with abnormal findings: Secondary | ICD-10-CM | POA: Diagnosis not present

## 2020-02-04 DIAGNOSIS — F3340 Major depressive disorder, recurrent, in remission, unspecified: Secondary | ICD-10-CM | POA: Diagnosis not present

## 2020-02-04 DIAGNOSIS — I509 Heart failure, unspecified: Secondary | ICD-10-CM | POA: Diagnosis not present

## 2020-02-07 DIAGNOSIS — I2510 Atherosclerotic heart disease of native coronary artery without angina pectoris: Secondary | ICD-10-CM | POA: Diagnosis not present

## 2020-02-07 DIAGNOSIS — N183 Chronic kidney disease, stage 3 unspecified: Secondary | ICD-10-CM | POA: Diagnosis not present

## 2020-02-07 DIAGNOSIS — J449 Chronic obstructive pulmonary disease, unspecified: Secondary | ICD-10-CM | POA: Diagnosis not present

## 2020-02-07 DIAGNOSIS — I5033 Acute on chronic diastolic (congestive) heart failure: Secondary | ICD-10-CM | POA: Diagnosis not present

## 2020-02-07 DIAGNOSIS — I130 Hypertensive heart and chronic kidney disease with heart failure and stage 1 through stage 4 chronic kidney disease, or unspecified chronic kidney disease: Secondary | ICD-10-CM | POA: Diagnosis not present

## 2020-02-07 DIAGNOSIS — E1122 Type 2 diabetes mellitus with diabetic chronic kidney disease: Secondary | ICD-10-CM | POA: Diagnosis not present

## 2020-02-08 DIAGNOSIS — L851 Acquired keratosis [keratoderma] palmaris et plantaris: Secondary | ICD-10-CM | POA: Diagnosis not present

## 2020-02-08 DIAGNOSIS — E1142 Type 2 diabetes mellitus with diabetic polyneuropathy: Secondary | ICD-10-CM | POA: Diagnosis not present

## 2020-02-08 DIAGNOSIS — L603 Nail dystrophy: Secondary | ICD-10-CM | POA: Diagnosis not present

## 2020-02-10 DIAGNOSIS — I5033 Acute on chronic diastolic (congestive) heart failure: Secondary | ICD-10-CM | POA: Diagnosis not present

## 2020-02-10 DIAGNOSIS — E1122 Type 2 diabetes mellitus with diabetic chronic kidney disease: Secondary | ICD-10-CM | POA: Diagnosis not present

## 2020-02-10 DIAGNOSIS — J449 Chronic obstructive pulmonary disease, unspecified: Secondary | ICD-10-CM | POA: Diagnosis not present

## 2020-02-10 DIAGNOSIS — N183 Chronic kidney disease, stage 3 unspecified: Secondary | ICD-10-CM | POA: Diagnosis not present

## 2020-02-10 DIAGNOSIS — I130 Hypertensive heart and chronic kidney disease with heart failure and stage 1 through stage 4 chronic kidney disease, or unspecified chronic kidney disease: Secondary | ICD-10-CM | POA: Diagnosis not present

## 2020-02-10 DIAGNOSIS — I2510 Atherosclerotic heart disease of native coronary artery without angina pectoris: Secondary | ICD-10-CM | POA: Diagnosis not present

## 2020-02-11 DIAGNOSIS — N183 Chronic kidney disease, stage 3 unspecified: Secondary | ICD-10-CM | POA: Diagnosis not present

## 2020-02-11 DIAGNOSIS — I5033 Acute on chronic diastolic (congestive) heart failure: Secondary | ICD-10-CM | POA: Diagnosis not present

## 2020-02-11 DIAGNOSIS — J449 Chronic obstructive pulmonary disease, unspecified: Secondary | ICD-10-CM | POA: Diagnosis not present

## 2020-02-11 DIAGNOSIS — I130 Hypertensive heart and chronic kidney disease with heart failure and stage 1 through stage 4 chronic kidney disease, or unspecified chronic kidney disease: Secondary | ICD-10-CM | POA: Diagnosis not present

## 2020-02-11 DIAGNOSIS — I2510 Atherosclerotic heart disease of native coronary artery without angina pectoris: Secondary | ICD-10-CM | POA: Diagnosis not present

## 2020-02-11 DIAGNOSIS — E1122 Type 2 diabetes mellitus with diabetic chronic kidney disease: Secondary | ICD-10-CM | POA: Diagnosis not present

## 2020-02-16 DIAGNOSIS — I2510 Atherosclerotic heart disease of native coronary artery without angina pectoris: Secondary | ICD-10-CM | POA: Diagnosis not present

## 2020-02-16 DIAGNOSIS — N183 Chronic kidney disease, stage 3 unspecified: Secondary | ICD-10-CM | POA: Diagnosis not present

## 2020-02-16 DIAGNOSIS — J449 Chronic obstructive pulmonary disease, unspecified: Secondary | ICD-10-CM | POA: Diagnosis not present

## 2020-02-16 DIAGNOSIS — E1122 Type 2 diabetes mellitus with diabetic chronic kidney disease: Secondary | ICD-10-CM | POA: Diagnosis not present

## 2020-02-16 DIAGNOSIS — I5033 Acute on chronic diastolic (congestive) heart failure: Secondary | ICD-10-CM | POA: Diagnosis not present

## 2020-02-16 DIAGNOSIS — I130 Hypertensive heart and chronic kidney disease with heart failure and stage 1 through stage 4 chronic kidney disease, or unspecified chronic kidney disease: Secondary | ICD-10-CM | POA: Diagnosis not present

## 2020-02-17 DIAGNOSIS — I130 Hypertensive heart and chronic kidney disease with heart failure and stage 1 through stage 4 chronic kidney disease, or unspecified chronic kidney disease: Secondary | ICD-10-CM | POA: Diagnosis not present

## 2020-02-17 DIAGNOSIS — I2510 Atherosclerotic heart disease of native coronary artery without angina pectoris: Secondary | ICD-10-CM | POA: Diagnosis not present

## 2020-02-17 DIAGNOSIS — N183 Chronic kidney disease, stage 3 unspecified: Secondary | ICD-10-CM | POA: Diagnosis not present

## 2020-02-17 DIAGNOSIS — I5033 Acute on chronic diastolic (congestive) heart failure: Secondary | ICD-10-CM | POA: Diagnosis not present

## 2020-02-17 DIAGNOSIS — J449 Chronic obstructive pulmonary disease, unspecified: Secondary | ICD-10-CM | POA: Diagnosis not present

## 2020-02-17 DIAGNOSIS — E1122 Type 2 diabetes mellitus with diabetic chronic kidney disease: Secondary | ICD-10-CM | POA: Diagnosis not present

## 2020-02-22 DIAGNOSIS — I5033 Acute on chronic diastolic (congestive) heart failure: Secondary | ICD-10-CM | POA: Diagnosis not present

## 2020-02-22 DIAGNOSIS — I2510 Atherosclerotic heart disease of native coronary artery without angina pectoris: Secondary | ICD-10-CM | POA: Diagnosis not present

## 2020-02-22 DIAGNOSIS — E1122 Type 2 diabetes mellitus with diabetic chronic kidney disease: Secondary | ICD-10-CM | POA: Diagnosis not present

## 2020-02-22 DIAGNOSIS — N183 Chronic kidney disease, stage 3 unspecified: Secondary | ICD-10-CM | POA: Diagnosis not present

## 2020-02-22 DIAGNOSIS — J449 Chronic obstructive pulmonary disease, unspecified: Secondary | ICD-10-CM | POA: Diagnosis not present

## 2020-02-22 DIAGNOSIS — I130 Hypertensive heart and chronic kidney disease with heart failure and stage 1 through stage 4 chronic kidney disease, or unspecified chronic kidney disease: Secondary | ICD-10-CM | POA: Diagnosis not present

## 2020-02-24 DIAGNOSIS — N183 Chronic kidney disease, stage 3 unspecified: Secondary | ICD-10-CM | POA: Diagnosis not present

## 2020-02-24 DIAGNOSIS — I130 Hypertensive heart and chronic kidney disease with heart failure and stage 1 through stage 4 chronic kidney disease, or unspecified chronic kidney disease: Secondary | ICD-10-CM | POA: Diagnosis not present

## 2020-02-24 DIAGNOSIS — I2510 Atherosclerotic heart disease of native coronary artery without angina pectoris: Secondary | ICD-10-CM | POA: Diagnosis not present

## 2020-02-24 DIAGNOSIS — J449 Chronic obstructive pulmonary disease, unspecified: Secondary | ICD-10-CM | POA: Diagnosis not present

## 2020-02-24 DIAGNOSIS — E1122 Type 2 diabetes mellitus with diabetic chronic kidney disease: Secondary | ICD-10-CM | POA: Diagnosis not present

## 2020-02-24 DIAGNOSIS — I5033 Acute on chronic diastolic (congestive) heart failure: Secondary | ICD-10-CM | POA: Diagnosis not present

## 2020-02-26 DIAGNOSIS — I2510 Atherosclerotic heart disease of native coronary artery without angina pectoris: Secondary | ICD-10-CM | POA: Diagnosis not present

## 2020-02-26 DIAGNOSIS — I130 Hypertensive heart and chronic kidney disease with heart failure and stage 1 through stage 4 chronic kidney disease, or unspecified chronic kidney disease: Secondary | ICD-10-CM | POA: Diagnosis not present

## 2020-02-26 DIAGNOSIS — E782 Mixed hyperlipidemia: Secondary | ICD-10-CM | POA: Diagnosis not present

## 2020-02-26 DIAGNOSIS — D509 Iron deficiency anemia, unspecified: Secondary | ICD-10-CM | POA: Diagnosis not present

## 2020-02-26 DIAGNOSIS — K449 Diaphragmatic hernia without obstruction or gangrene: Secondary | ICD-10-CM | POA: Diagnosis not present

## 2020-02-26 DIAGNOSIS — E1122 Type 2 diabetes mellitus with diabetic chronic kidney disease: Secondary | ICD-10-CM | POA: Diagnosis not present

## 2020-02-26 DIAGNOSIS — E039 Hypothyroidism, unspecified: Secondary | ICD-10-CM | POA: Diagnosis not present

## 2020-02-26 DIAGNOSIS — M549 Dorsalgia, unspecified: Secondary | ICD-10-CM | POA: Diagnosis not present

## 2020-02-26 DIAGNOSIS — E2740 Unspecified adrenocortical insufficiency: Secondary | ICD-10-CM | POA: Diagnosis not present

## 2020-02-26 DIAGNOSIS — I4819 Other persistent atrial fibrillation: Secondary | ICD-10-CM | POA: Diagnosis not present

## 2020-02-26 DIAGNOSIS — H409 Unspecified glaucoma: Secondary | ICD-10-CM | POA: Diagnosis not present

## 2020-02-26 DIAGNOSIS — K219 Gastro-esophageal reflux disease without esophagitis: Secondary | ICD-10-CM | POA: Diagnosis not present

## 2020-02-26 DIAGNOSIS — K5790 Diverticulosis of intestine, part unspecified, without perforation or abscess without bleeding: Secondary | ICD-10-CM | POA: Diagnosis not present

## 2020-02-26 DIAGNOSIS — E1142 Type 2 diabetes mellitus with diabetic polyneuropathy: Secondary | ICD-10-CM | POA: Diagnosis not present

## 2020-02-26 DIAGNOSIS — G8929 Other chronic pain: Secondary | ICD-10-CM | POA: Diagnosis not present

## 2020-02-26 DIAGNOSIS — M1990 Unspecified osteoarthritis, unspecified site: Secondary | ICD-10-CM | POA: Diagnosis not present

## 2020-02-26 DIAGNOSIS — J449 Chronic obstructive pulmonary disease, unspecified: Secondary | ICD-10-CM | POA: Diagnosis not present

## 2020-02-26 DIAGNOSIS — F419 Anxiety disorder, unspecified: Secondary | ICD-10-CM | POA: Diagnosis not present

## 2020-02-26 DIAGNOSIS — E538 Deficiency of other specified B group vitamins: Secondary | ICD-10-CM | POA: Diagnosis not present

## 2020-02-26 DIAGNOSIS — I471 Supraventricular tachycardia: Secondary | ICD-10-CM | POA: Diagnosis not present

## 2020-02-26 DIAGNOSIS — I5033 Acute on chronic diastolic (congestive) heart failure: Secondary | ICD-10-CM | POA: Diagnosis not present

## 2020-02-26 DIAGNOSIS — N183 Chronic kidney disease, stage 3 unspecified: Secondary | ICD-10-CM | POA: Diagnosis not present

## 2020-02-26 DIAGNOSIS — Z6841 Body Mass Index (BMI) 40.0 and over, adult: Secondary | ICD-10-CM | POA: Diagnosis not present

## 2020-02-29 DIAGNOSIS — J449 Chronic obstructive pulmonary disease, unspecified: Secondary | ICD-10-CM | POA: Diagnosis not present

## 2020-02-29 DIAGNOSIS — I130 Hypertensive heart and chronic kidney disease with heart failure and stage 1 through stage 4 chronic kidney disease, or unspecified chronic kidney disease: Secondary | ICD-10-CM | POA: Diagnosis not present

## 2020-02-29 DIAGNOSIS — N183 Chronic kidney disease, stage 3 unspecified: Secondary | ICD-10-CM | POA: Diagnosis not present

## 2020-02-29 DIAGNOSIS — I5033 Acute on chronic diastolic (congestive) heart failure: Secondary | ICD-10-CM | POA: Diagnosis not present

## 2020-02-29 DIAGNOSIS — I2510 Atherosclerotic heart disease of native coronary artery without angina pectoris: Secondary | ICD-10-CM | POA: Diagnosis not present

## 2020-02-29 DIAGNOSIS — E1122 Type 2 diabetes mellitus with diabetic chronic kidney disease: Secondary | ICD-10-CM | POA: Diagnosis not present

## 2020-03-02 DIAGNOSIS — N183 Chronic kidney disease, stage 3 unspecified: Secondary | ICD-10-CM | POA: Diagnosis not present

## 2020-03-02 DIAGNOSIS — I5033 Acute on chronic diastolic (congestive) heart failure: Secondary | ICD-10-CM | POA: Diagnosis not present

## 2020-03-02 DIAGNOSIS — I2510 Atherosclerotic heart disease of native coronary artery without angina pectoris: Secondary | ICD-10-CM | POA: Diagnosis not present

## 2020-03-02 DIAGNOSIS — I130 Hypertensive heart and chronic kidney disease with heart failure and stage 1 through stage 4 chronic kidney disease, or unspecified chronic kidney disease: Secondary | ICD-10-CM | POA: Diagnosis not present

## 2020-03-02 DIAGNOSIS — J449 Chronic obstructive pulmonary disease, unspecified: Secondary | ICD-10-CM | POA: Diagnosis not present

## 2020-03-02 DIAGNOSIS — E1122 Type 2 diabetes mellitus with diabetic chronic kidney disease: Secondary | ICD-10-CM | POA: Diagnosis not present

## 2020-03-07 DIAGNOSIS — I130 Hypertensive heart and chronic kidney disease with heart failure and stage 1 through stage 4 chronic kidney disease, or unspecified chronic kidney disease: Secondary | ICD-10-CM | POA: Diagnosis not present

## 2020-03-07 DIAGNOSIS — J449 Chronic obstructive pulmonary disease, unspecified: Secondary | ICD-10-CM | POA: Diagnosis not present

## 2020-03-07 DIAGNOSIS — N183 Chronic kidney disease, stage 3 unspecified: Secondary | ICD-10-CM | POA: Diagnosis not present

## 2020-03-07 DIAGNOSIS — E1122 Type 2 diabetes mellitus with diabetic chronic kidney disease: Secondary | ICD-10-CM | POA: Diagnosis not present

## 2020-03-07 DIAGNOSIS — I5033 Acute on chronic diastolic (congestive) heart failure: Secondary | ICD-10-CM | POA: Diagnosis not present

## 2020-03-07 DIAGNOSIS — I2510 Atherosclerotic heart disease of native coronary artery without angina pectoris: Secondary | ICD-10-CM | POA: Diagnosis not present

## 2020-03-09 DIAGNOSIS — I5033 Acute on chronic diastolic (congestive) heart failure: Secondary | ICD-10-CM | POA: Diagnosis not present

## 2020-03-09 DIAGNOSIS — N183 Chronic kidney disease, stage 3 unspecified: Secondary | ICD-10-CM | POA: Diagnosis not present

## 2020-03-09 DIAGNOSIS — I130 Hypertensive heart and chronic kidney disease with heart failure and stage 1 through stage 4 chronic kidney disease, or unspecified chronic kidney disease: Secondary | ICD-10-CM | POA: Diagnosis not present

## 2020-03-09 DIAGNOSIS — I2510 Atherosclerotic heart disease of native coronary artery without angina pectoris: Secondary | ICD-10-CM | POA: Diagnosis not present

## 2020-03-09 DIAGNOSIS — J449 Chronic obstructive pulmonary disease, unspecified: Secondary | ICD-10-CM | POA: Diagnosis not present

## 2020-03-09 DIAGNOSIS — E1122 Type 2 diabetes mellitus with diabetic chronic kidney disease: Secondary | ICD-10-CM | POA: Diagnosis not present

## 2020-03-10 ENCOUNTER — Telehealth: Payer: Self-pay

## 2020-03-10 NOTE — Telephone Encounter (Signed)
Palliative care SW left HIPPA complaint VM for patient in an attempt to schedule initial visit. Awaiting return call.

## 2020-03-13 DIAGNOSIS — I5033 Acute on chronic diastolic (congestive) heart failure: Secondary | ICD-10-CM | POA: Diagnosis not present

## 2020-03-13 DIAGNOSIS — N183 Chronic kidney disease, stage 3 unspecified: Secondary | ICD-10-CM | POA: Diagnosis not present

## 2020-03-13 DIAGNOSIS — J449 Chronic obstructive pulmonary disease, unspecified: Secondary | ICD-10-CM | POA: Diagnosis not present

## 2020-03-13 DIAGNOSIS — I2510 Atherosclerotic heart disease of native coronary artery without angina pectoris: Secondary | ICD-10-CM | POA: Diagnosis not present

## 2020-03-13 DIAGNOSIS — E1122 Type 2 diabetes mellitus with diabetic chronic kidney disease: Secondary | ICD-10-CM | POA: Diagnosis not present

## 2020-03-13 DIAGNOSIS — I130 Hypertensive heart and chronic kidney disease with heart failure and stage 1 through stage 4 chronic kidney disease, or unspecified chronic kidney disease: Secondary | ICD-10-CM | POA: Diagnosis not present

## 2020-03-14 DIAGNOSIS — I130 Hypertensive heart and chronic kidney disease with heart failure and stage 1 through stage 4 chronic kidney disease, or unspecified chronic kidney disease: Secondary | ICD-10-CM | POA: Diagnosis not present

## 2020-03-14 DIAGNOSIS — N183 Chronic kidney disease, stage 3 unspecified: Secondary | ICD-10-CM | POA: Diagnosis not present

## 2020-03-14 DIAGNOSIS — E1122 Type 2 diabetes mellitus with diabetic chronic kidney disease: Secondary | ICD-10-CM | POA: Diagnosis not present

## 2020-03-14 DIAGNOSIS — I5033 Acute on chronic diastolic (congestive) heart failure: Secondary | ICD-10-CM | POA: Diagnosis not present

## 2020-03-14 DIAGNOSIS — J449 Chronic obstructive pulmonary disease, unspecified: Secondary | ICD-10-CM | POA: Diagnosis not present

## 2020-03-14 DIAGNOSIS — I2510 Atherosclerotic heart disease of native coronary artery without angina pectoris: Secondary | ICD-10-CM | POA: Diagnosis not present

## 2020-03-16 DIAGNOSIS — E1122 Type 2 diabetes mellitus with diabetic chronic kidney disease: Secondary | ICD-10-CM | POA: Diagnosis not present

## 2020-03-16 DIAGNOSIS — I2510 Atherosclerotic heart disease of native coronary artery without angina pectoris: Secondary | ICD-10-CM | POA: Diagnosis not present

## 2020-03-16 DIAGNOSIS — I130 Hypertensive heart and chronic kidney disease with heart failure and stage 1 through stage 4 chronic kidney disease, or unspecified chronic kidney disease: Secondary | ICD-10-CM | POA: Diagnosis not present

## 2020-03-16 DIAGNOSIS — N183 Chronic kidney disease, stage 3 unspecified: Secondary | ICD-10-CM | POA: Diagnosis not present

## 2020-03-16 DIAGNOSIS — J449 Chronic obstructive pulmonary disease, unspecified: Secondary | ICD-10-CM | POA: Diagnosis not present

## 2020-03-16 DIAGNOSIS — I5033 Acute on chronic diastolic (congestive) heart failure: Secondary | ICD-10-CM | POA: Diagnosis not present

## 2020-03-17 DIAGNOSIS — J441 Chronic obstructive pulmonary disease with (acute) exacerbation: Secondary | ICD-10-CM | POA: Diagnosis not present

## 2020-03-17 DIAGNOSIS — E1142 Type 2 diabetes mellitus with diabetic polyneuropathy: Secondary | ICD-10-CM | POA: Diagnosis not present

## 2020-03-17 DIAGNOSIS — Z23 Encounter for immunization: Secondary | ICD-10-CM | POA: Diagnosis not present

## 2020-03-17 DIAGNOSIS — I509 Heart failure, unspecified: Secondary | ICD-10-CM | POA: Diagnosis not present

## 2020-03-21 NOTE — Telephone Encounter (Signed)
Na

## 2020-03-23 ENCOUNTER — Other Ambulatory Visit: Payer: Self-pay

## 2020-03-23 ENCOUNTER — Other Ambulatory Visit: Payer: Medicare Other

## 2020-03-23 VITALS — BP 110/70 | HR 71 | Resp 32 | Wt 263.4 lb

## 2020-03-23 DIAGNOSIS — N183 Chronic kidney disease, stage 3 unspecified: Secondary | ICD-10-CM | POA: Diagnosis not present

## 2020-03-23 DIAGNOSIS — I130 Hypertensive heart and chronic kidney disease with heart failure and stage 1 through stage 4 chronic kidney disease, or unspecified chronic kidney disease: Secondary | ICD-10-CM | POA: Diagnosis not present

## 2020-03-23 DIAGNOSIS — J449 Chronic obstructive pulmonary disease, unspecified: Secondary | ICD-10-CM | POA: Diagnosis not present

## 2020-03-23 DIAGNOSIS — I5033 Acute on chronic diastolic (congestive) heart failure: Secondary | ICD-10-CM | POA: Diagnosis not present

## 2020-03-23 DIAGNOSIS — Z515 Encounter for palliative care: Secondary | ICD-10-CM

## 2020-03-23 DIAGNOSIS — E1122 Type 2 diabetes mellitus with diabetic chronic kidney disease: Secondary | ICD-10-CM | POA: Diagnosis not present

## 2020-03-23 DIAGNOSIS — I2510 Atherosclerotic heart disease of native coronary artery without angina pectoris: Secondary | ICD-10-CM | POA: Diagnosis not present

## 2020-03-23 NOTE — Progress Notes (Signed)
PATIENT NAME: Ann [name] DOB: 1946/04/14 MRN: [id number]  PRIMARY CARE PROVIDER: Rosita Fire, MD  RESPONSIBLE PARTY:  Acct ID - Guarantor Home Phone Work Phone Relationship Acct Type  [id number] - [name],[phone]  Self P/F     [place], [place], [place] [address]    PLAN OF CARE and INTERVENTIONS:               1.  GOALS OF CARE/ ADVANCE CARE PLANNING: Patient desires to remain in the home and independent.  Discussion about her code status is discussed and patient desires a DNR.               2.  PATIENT/CAREGIVER EDUCATION:  Palliative Care Services               3.  DISEASE STATUS: Greeted at the door by patient's son.  Patient is found in the kitchen and later joined by her spouse.  Patient is engaging in conversation and explains her medical history and recent hospitalization.  Palliative Care services explained.  Patient asked if we were under hospice and explained [facility] and differences between the services.  Patient states she would not want to go to hospice and she does not wish to leave her home.  I explained hospice care and that she could have this service in home vs going to the hospice home if that was needed.  Patient voiced understanding of the different services.    Patient is visibly short of breath while talking.  She states she has pursed lip breathing with exertion.  She is currently on O2 at 2 L via [place].  Patient continues with breathing treatments and reports this is helpful.      Education provided on CHF and monitoring weight and eating a low Na+ diet.  Patient is checking her weight routinely.  She also voiced the importance of monitoring weights and swelling.    Patient is currently using a rolling walker and reports no recent falls.    Blood sugar was taken during this visit by patient and her reading is 143.  Patient states she is normally below 200.  She also administered her insulin on this visit.   HISTORY OF PRESENT  ILLNESS:  74 year old female with a history of COPD.  She is being followed by Palliative Care monthly and PRN.  CODE STATUS: Desires DNR.  ADVANCED DIRECTIVES: No MOST FORM: No PPS: 50%   PHYSICAL EXAM:   VITALS:  BP 110/70  P 71 R 32 O2 sats 98% on 2 L via [place]. LUNGS: scattered rhonchi to bilateral lobes CARDIAC:  HR irregular EXTREMITIES: No edema present SKIN: No skin breakdown reported.  Skin warm and dry to touch. NEURO: Alert and oriented x 3       Lorenza Burton, RN

## 2020-03-23 NOTE — Progress Notes (Signed)
COMMUNITY PALLIATIVE CARE SW NOTE  PATIENT NAME: Ann [name] DOB: 02-14-1946 MRN: [id number]  PRIMARY CARE PROVIDER: Rosita Fire, MD  RESPONSIBLE PARTY:  Acct ID - Guarantor Home Phone Work Phone Relationship Acct Type  [id number] - [place],[phone]  Self P/F     [place], [place], [place] [address]     PLAN OF CARE and INTERVENTIONS:             1. GOALS OF CARE/ ADVANCE CARE PLANNING:  Patient is currently a Full code but wishes to be a DNR. Patient does not have any advance directives. Patients goal is to remain in the home as long as possible with family. SOCIAL/EMOTIONAL/SPIRITUAL ASSESSMENT/ INTERVENTIONS:  SW and RN Almyra Free met with patient and patients husband in their home. Patient lives in a two story home. Patient updated SW and RN medical condition and changes. Patient shared that she had a hospitalization in at the end of august due to A-Fib and [facility]. Patient was discharged with [facility], of which she is till receiving services. Patient is on 2LPM O2 and takes nebulizer treatments as needed. Patient shared that patient eats well. Sleeps okay takes Z-Quil as needed. No recent falls reported. Patient has a cough of which she says can cause some pain in her back if she coughs a lot.  No recent medication changes. RN reviewed medications and took vitals. Patient checks BS daily and encouraged to weigh self daily as well. RN and SW discussed Hospice services and patient states that she does not want hospice at all. SW discussed goals, reviewed care plan, provided emotional support, used active and reflective listening. Palliative care will continue to monitor and assist with long term care planning as needed.  3. PATIENT/CAREGIVER EDUCATION/ COPING:  Patient A&O, patient is able to engage in open conversation with SW and answer questions appropriately. Patient denies any anxiety or depression. Patients support is her husband, son, grandson, niece and nephew who  all live with her. 4. PERSONAL EMERGENCY PLAN:  Patient will call 9-1-1 for emergencies.  5. COMMUNITY RESOURCES COORDINATION/ HEALTH CARE NAVIGATION:  Patient manages her care. 6. FINANCIAL/LEGAL CONCERNS/INTERVENTIONS:  None.     SOCIAL HX:  Social History   Tobacco Use  . Smoking status: Former Smoker    Quit date: 05/17/1979    Years since quitting: 40.8  . Smokeless tobacco: Never Used  Substance Use Topics  . Alcohol use: No    Alcohol/week: 0.0 standard drinks    CODE STATUS: Full code, wants to be DNR  ADVANCED DIRECTIVES: N MOST FORM COMPLETE: N HOSPICE EDUCATION PROVIDED: Y  PPS: Patient is ambulatory with rollator and independent with all ADL's. Family assist with meal prep.    Time spent: 45 min     [place], [place]

## 2020-03-24 DIAGNOSIS — N183 Chronic kidney disease, stage 3 unspecified: Secondary | ICD-10-CM | POA: Diagnosis not present

## 2020-03-24 DIAGNOSIS — J449 Chronic obstructive pulmonary disease, unspecified: Secondary | ICD-10-CM | POA: Diagnosis not present

## 2020-03-24 DIAGNOSIS — I2510 Atherosclerotic heart disease of native coronary artery without angina pectoris: Secondary | ICD-10-CM | POA: Diagnosis not present

## 2020-03-24 DIAGNOSIS — I5033 Acute on chronic diastolic (congestive) heart failure: Secondary | ICD-10-CM | POA: Diagnosis not present

## 2020-03-24 DIAGNOSIS — I130 Hypertensive heart and chronic kidney disease with heart failure and stage 1 through stage 4 chronic kidney disease, or unspecified chronic kidney disease: Secondary | ICD-10-CM | POA: Diagnosis not present

## 2020-03-24 DIAGNOSIS — E1122 Type 2 diabetes mellitus with diabetic chronic kidney disease: Secondary | ICD-10-CM | POA: Diagnosis not present

## 2020-03-27 DIAGNOSIS — I471 Supraventricular tachycardia: Secondary | ICD-10-CM | POA: Diagnosis not present

## 2020-03-27 DIAGNOSIS — K5790 Diverticulosis of intestine, part unspecified, without perforation or abscess without bleeding: Secondary | ICD-10-CM | POA: Diagnosis not present

## 2020-03-27 DIAGNOSIS — K219 Gastro-esophageal reflux disease without esophagitis: Secondary | ICD-10-CM | POA: Diagnosis not present

## 2020-03-27 DIAGNOSIS — M549 Dorsalgia, unspecified: Secondary | ICD-10-CM | POA: Diagnosis not present

## 2020-03-27 DIAGNOSIS — K449 Diaphragmatic hernia without obstruction or gangrene: Secondary | ICD-10-CM | POA: Diagnosis not present

## 2020-03-27 DIAGNOSIS — N183 Chronic kidney disease, stage 3 unspecified: Secondary | ICD-10-CM | POA: Diagnosis not present

## 2020-03-27 DIAGNOSIS — H409 Unspecified glaucoma: Secondary | ICD-10-CM | POA: Diagnosis not present

## 2020-03-27 DIAGNOSIS — E538 Deficiency of other specified B group vitamins: Secondary | ICD-10-CM | POA: Diagnosis not present

## 2020-03-27 DIAGNOSIS — G8929 Other chronic pain: Secondary | ICD-10-CM | POA: Diagnosis not present

## 2020-03-27 DIAGNOSIS — Z7901 Long term (current) use of anticoagulants: Secondary | ICD-10-CM | POA: Diagnosis not present

## 2020-03-27 DIAGNOSIS — I130 Hypertensive heart and chronic kidney disease with heart failure and stage 1 through stage 4 chronic kidney disease, or unspecified chronic kidney disease: Secondary | ICD-10-CM | POA: Diagnosis not present

## 2020-03-27 DIAGNOSIS — E039 Hypothyroidism, unspecified: Secondary | ICD-10-CM | POA: Diagnosis not present

## 2020-03-27 DIAGNOSIS — E782 Mixed hyperlipidemia: Secondary | ICD-10-CM | POA: Diagnosis not present

## 2020-03-27 DIAGNOSIS — F419 Anxiety disorder, unspecified: Secondary | ICD-10-CM | POA: Diagnosis not present

## 2020-03-27 DIAGNOSIS — I4819 Other persistent atrial fibrillation: Secondary | ICD-10-CM | POA: Diagnosis not present

## 2020-03-27 DIAGNOSIS — I2510 Atherosclerotic heart disease of native coronary artery without angina pectoris: Secondary | ICD-10-CM | POA: Diagnosis not present

## 2020-03-27 DIAGNOSIS — M1990 Unspecified osteoarthritis, unspecified site: Secondary | ICD-10-CM | POA: Diagnosis not present

## 2020-03-27 DIAGNOSIS — J449 Chronic obstructive pulmonary disease, unspecified: Secondary | ICD-10-CM | POA: Diagnosis not present

## 2020-03-27 DIAGNOSIS — I5033 Acute on chronic diastolic (congestive) heart failure: Secondary | ICD-10-CM | POA: Diagnosis not present

## 2020-03-27 DIAGNOSIS — Z6841 Body Mass Index (BMI) 40.0 and over, adult: Secondary | ICD-10-CM | POA: Diagnosis not present

## 2020-03-27 DIAGNOSIS — E1122 Type 2 diabetes mellitus with diabetic chronic kidney disease: Secondary | ICD-10-CM | POA: Diagnosis not present

## 2020-03-27 DIAGNOSIS — E1142 Type 2 diabetes mellitus with diabetic polyneuropathy: Secondary | ICD-10-CM | POA: Diagnosis not present

## 2020-03-27 DIAGNOSIS — D509 Iron deficiency anemia, unspecified: Secondary | ICD-10-CM | POA: Diagnosis not present

## 2020-03-27 DIAGNOSIS — E2740 Unspecified adrenocortical insufficiency: Secondary | ICD-10-CM | POA: Diagnosis not present

## 2020-03-28 ENCOUNTER — Encounter: Payer: Self-pay | Admitting: Internal Medicine

## 2020-03-30 ENCOUNTER — Encounter: Payer: Self-pay | Admitting: Student

## 2020-03-30 ENCOUNTER — Other Ambulatory Visit: Payer: Self-pay

## 2020-03-30 ENCOUNTER — Ambulatory Visit (INDEPENDENT_AMBULATORY_CARE_PROVIDER_SITE_OTHER): Payer: Medicare Other | Admitting: Student

## 2020-03-30 VITALS — BP 130/82 | HR 68 | Ht 63.0 in | Wt 270.0 lb

## 2020-03-30 DIAGNOSIS — I4819 Other persistent atrial fibrillation: Secondary | ICD-10-CM | POA: Diagnosis not present

## 2020-03-30 DIAGNOSIS — I5032 Chronic diastolic (congestive) heart failure: Secondary | ICD-10-CM | POA: Diagnosis not present

## 2020-03-30 DIAGNOSIS — I25119 Atherosclerotic heart disease of native coronary artery with unspecified angina pectoris: Secondary | ICD-10-CM | POA: Diagnosis not present

## 2020-03-30 DIAGNOSIS — I10 Essential (primary) hypertension: Secondary | ICD-10-CM | POA: Diagnosis not present

## 2020-03-30 DIAGNOSIS — E2740 Unspecified adrenocortical insufficiency: Secondary | ICD-10-CM

## 2020-03-30 DIAGNOSIS — I2510 Atherosclerotic heart disease of native coronary artery without angina pectoris: Secondary | ICD-10-CM | POA: Diagnosis not present

## 2020-03-30 MED ORDER — DILTIAZEM HCL ER COATED BEADS 300 MG PO CP24: 300.0000 mg | ORAL_CAPSULE | Freq: Every day | ORAL | 3 refills | Status: DC
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
## 2020-03-30 NOTE — Patient Instructions (Signed)
Medication Instructions:  Your physician recommends that you continue on your current medications as directed. Please refer to the Current Medication list given to you today.  Increase Cardizem to 300 mg Daily   *If you need a refill on your cardiac medications before your next appointment, please call your pharmacy*   Lab Work: NONE  If you have labs (blood work) drawn today and your tests are completely normal, you will receive your results only by: Marland Kitchen MyChart Message (if you have MyChart) OR . A paper copy in the mail If you have any lab test that is abnormal or we need to change your treatment, we will call you to review the results.   Testing/Procedures: NONE    Follow-Up: At [facility], you and your health needs are our priority.  As part of our continuing mission to provide you with exceptional heart care, we have created designated Provider Care Teams.  These Care Teams include your primary Cardiologist (physician) and Advanced Practice Providers (APPs -  Physician Assistants and Nurse Practitioners) who all work together to provide you with the care you need, when you need it.  We recommend signing up for the patient portal called "MyChart".  Sign up information is provided on this After Visit Summary.  MyChart is used to connect with patients for Virtual Visits (Telemedicine).  Patients are able to view lab/test results, encounter notes, upcoming appointments, etc.  Non-urgent messages can be sent to your provider as well.   To learn more about what you can do with MyChart, go to [email or web address].    Your next appointment:   3 month(s)  The format for your next appointment:   In Person  Provider:   Bernerd Pho, PA-C   Other Instructions Thank you for choosing [facility]!

## 2020-03-31 DIAGNOSIS — I5033 Acute on chronic diastolic (congestive) heart failure: Secondary | ICD-10-CM | POA: Diagnosis not present

## 2020-03-31 DIAGNOSIS — E1122 Type 2 diabetes mellitus with diabetic chronic kidney disease: Secondary | ICD-10-CM | POA: Diagnosis not present

## 2020-03-31 DIAGNOSIS — I2510 Atherosclerotic heart disease of native coronary artery without angina pectoris: Secondary | ICD-10-CM | POA: Diagnosis not present

## 2020-03-31 DIAGNOSIS — I130 Hypertensive heart and chronic kidney disease with heart failure and stage 1 through stage 4 chronic kidney disease, or unspecified chronic kidney disease: Secondary | ICD-10-CM | POA: Diagnosis not present

## 2020-03-31 DIAGNOSIS — N183 Chronic kidney disease, stage 3 unspecified: Secondary | ICD-10-CM | POA: Diagnosis not present

## 2020-03-31 DIAGNOSIS — J449 Chronic obstructive pulmonary disease, unspecified: Secondary | ICD-10-CM | POA: Diagnosis not present

## 2020-04-04 DIAGNOSIS — N183 Chronic kidney disease, stage 3 unspecified: Secondary | ICD-10-CM | POA: Diagnosis not present

## 2020-04-04 DIAGNOSIS — I2510 Atherosclerotic heart disease of native coronary artery without angina pectoris: Secondary | ICD-10-CM | POA: Diagnosis not present

## 2020-04-04 DIAGNOSIS — J449 Chronic obstructive pulmonary disease, unspecified: Secondary | ICD-10-CM | POA: Diagnosis not present

## 2020-04-04 DIAGNOSIS — E1122 Type 2 diabetes mellitus with diabetic chronic kidney disease: Secondary | ICD-10-CM | POA: Diagnosis not present

## 2020-04-04 DIAGNOSIS — I130 Hypertensive heart and chronic kidney disease with heart failure and stage 1 through stage 4 chronic kidney disease, or unspecified chronic kidney disease: Secondary | ICD-10-CM | POA: Diagnosis not present

## 2020-04-04 DIAGNOSIS — I5033 Acute on chronic diastolic (congestive) heart failure: Secondary | ICD-10-CM | POA: Diagnosis not present

## 2020-04-07 ENCOUNTER — Telehealth: Payer: Self-pay

## 2020-04-07 NOTE — Telephone Encounter (Signed)
12:04PM: Palliative care SW outreached patient to complete telephonic visit.   Palliative care SW outreached patient to complete telephonic visit. Patient provided update on medical condition and/or changes. Patient shared that there has not been any changes since last check in and that she has been doing pretty good. Patient had appointment with cardiologist last week that went well. Patient has appt with PCP next week. No recent falls. Patient eats well. Current weight is 261/5lbs. Patient shared that she has not seeing taking her fluid pills due to not having swelling in her legs and that the fluid pills keep her up at night going to the restroom. Patient sleeping well. 261.5lbs. Patient shared that her breathing has been fine and she's is using her breathing treatments as needed. Patient shared that she has not received the mailed DNR form but that she and her husband has discussed her code status further and they are leaning more towards her being a full code with limited scope of treatment, but that she will discuss further and let SW know for sure. SW will remove DNR from [facility] for now due to patient unsure of code status. No other psychosocial needs. Patient appreciative of telephonic check in.   Plan: Palliative care will continue to monitor and assist with long term care planning as needed.

## 2020-04-10 DIAGNOSIS — I5032 Chronic diastolic (congestive) heart failure: Secondary | ICD-10-CM | POA: Diagnosis not present

## 2020-04-10 DIAGNOSIS — J449 Chronic obstructive pulmonary disease, unspecified: Secondary | ICD-10-CM | POA: Diagnosis not present

## 2020-04-10 DIAGNOSIS — I10 Essential (primary) hypertension: Secondary | ICD-10-CM | POA: Diagnosis not present

## 2020-04-11 ENCOUNTER — Other Ambulatory Visit: Payer: Self-pay | Admitting: "Endocrinology

## 2020-04-11 DIAGNOSIS — E119 Type 2 diabetes mellitus without complications: Secondary | ICD-10-CM | POA: Diagnosis not present

## 2020-04-12 LAB — COMPLETE METABOLIC PANEL WITH GFR
AG Ratio: 1.7 (calc) (ref 1.0–2.5)
ALT: 11 U/L (ref 6–29)
AST: 16 U/L (ref 10–35)
Albumin: 4 g/dL (ref 3.6–5.1)
Alkaline phosphatase (APISO): 62 U/L (ref 37–153)
BUN: 14 mg/dL (ref 7–25)
CO2: 23 mmol/L (ref 20–32)
Calcium: 9.3 mg/dL (ref 8.6–10.4)
Chloride: 108 mmol/L (ref 98–110)
Creat: 0.79 mg/dL (ref 0.60–0.93)
GFR, Est African American: 85 mL/min/{1.73_m2} (ref >=60)
GFR, Est Non African American: 74 mL/min/{1.73_m2} (ref >=60)
Globulin: 2.4 g/dL (calc) (ref 1.9–3.7)
Glucose, Bld: 137 mg/dL — ABNORMAL HIGH (ref 65–99)
Potassium: 5 mmol/L (ref 3.5–5.3)
Sodium: 141 mmol/L (ref 135–146)
Total Bilirubin: 0.3 mg/dL (ref 0.2–1.2)
Total Protein: 6.4 g/dL (ref 6.1–8.1)

## 2020-04-12 LAB — HEMOGLOBIN A1C
Hgb A1c MFr Bld: 7.1 % of total Hgb — ABNORMAL HIGH (ref <5.7)
Mean Plasma Glucose: 157 (calc)
eAG (mmol/L): 8.7 (calc)

## 2020-04-13 ENCOUNTER — Telehealth: Payer: Self-pay | Admitting: Cardiology

## 2020-04-13 DIAGNOSIS — N183 Chronic kidney disease, stage 3 unspecified: Secondary | ICD-10-CM | POA: Diagnosis not present

## 2020-04-13 DIAGNOSIS — E1122 Type 2 diabetes mellitus with diabetic chronic kidney disease: Secondary | ICD-10-CM | POA: Diagnosis not present

## 2020-04-13 DIAGNOSIS — I5033 Acute on chronic diastolic (congestive) heart failure: Secondary | ICD-10-CM | POA: Diagnosis not present

## 2020-04-13 DIAGNOSIS — J449 Chronic obstructive pulmonary disease, unspecified: Secondary | ICD-10-CM | POA: Diagnosis not present

## 2020-04-13 DIAGNOSIS — I130 Hypertensive heart and chronic kidney disease with heart failure and stage 1 through stage 4 chronic kidney disease, or unspecified chronic kidney disease: Secondary | ICD-10-CM | POA: Diagnosis not present

## 2020-04-13 DIAGNOSIS — I2510 Atherosclerotic heart disease of native coronary artery without angina pectoris: Secondary | ICD-10-CM | POA: Diagnosis not present

## 2020-04-13 NOTE — Telephone Encounter (Signed)
I spoke with home health nurse.She states patient does not weigh herself daily unless home health is coming by.  Her weight yesterday was 268.7 lbs so she took Lasix 120 mg because she was told to do this one other time.Nurse will f/u with her tomorrow and weigh her.She will also stress the importance of weighing daily and not self dosing.

## 2020-04-13 NOTE — Telephone Encounter (Signed)
Pt c/o swelling: STAT is pt has developed SOB within 24 hours  1) How much weight have you gained and in what time span? 4 pounds in 2 days   2) If swelling, where is the swelling located? Legs and feet   3) Are you currently taking a fluid pill? Yes   4) Are you currently SOB? Yes with minimal exertion   5) Do you have a log of your daily weights (if so, list)?   03/31/20: 261.5  04/11/20: 264.8  04/13/20: 268.7  6) Have you gained 3 pounds in a day or 5 pounds in a week? 4 pounds in 2 days  7) Have you traveled recently?  No  Pt told the [facility] that the last time this happened she was advised to take 3 lasix pills. She has done that already and the [facility] was calling to report the information    Home Health Nurse- [phone]

## 2020-04-18 ENCOUNTER — Telehealth (INDEPENDENT_AMBULATORY_CARE_PROVIDER_SITE_OTHER): Payer: Medicare Other | Admitting: "Endocrinology

## 2020-04-18 ENCOUNTER — Encounter: Payer: Self-pay | Admitting: "Endocrinology

## 2020-04-18 VITALS — BP 130/87 | Ht 60.0 in | Wt 261.5 lb

## 2020-04-18 DIAGNOSIS — E119 Type 2 diabetes mellitus without complications: Secondary | ICD-10-CM

## 2020-04-18 DIAGNOSIS — I10 Essential (primary) hypertension: Secondary | ICD-10-CM | POA: Diagnosis not present

## 2020-04-18 DIAGNOSIS — E782 Mixed hyperlipidemia: Secondary | ICD-10-CM | POA: Diagnosis not present

## 2020-04-18 DIAGNOSIS — E039 Hypothyroidism, unspecified: Secondary | ICD-10-CM

## 2020-04-18 DIAGNOSIS — E2740 Unspecified adrenocortical insufficiency: Secondary | ICD-10-CM

## 2020-04-18 DIAGNOSIS — I25119 Atherosclerotic heart disease of native coronary artery with unspecified angina pectoris: Secondary | ICD-10-CM

## 2020-04-18 NOTE — Patient Instructions (Signed)
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
## 2020-04-21 DIAGNOSIS — I130 Hypertensive heart and chronic kidney disease with heart failure and stage 1 through stage 4 chronic kidney disease, or unspecified chronic kidney disease: Secondary | ICD-10-CM | POA: Diagnosis not present

## 2020-04-21 DIAGNOSIS — I5033 Acute on chronic diastolic (congestive) heart failure: Secondary | ICD-10-CM | POA: Diagnosis not present

## 2020-04-21 DIAGNOSIS — E1122 Type 2 diabetes mellitus with diabetic chronic kidney disease: Secondary | ICD-10-CM | POA: Diagnosis not present

## 2020-04-21 DIAGNOSIS — N183 Chronic kidney disease, stage 3 unspecified: Secondary | ICD-10-CM | POA: Diagnosis not present

## 2020-04-21 DIAGNOSIS — I2510 Atherosclerotic heart disease of native coronary artery without angina pectoris: Secondary | ICD-10-CM | POA: Diagnosis not present

## 2020-04-21 DIAGNOSIS — J449 Chronic obstructive pulmonary disease, unspecified: Secondary | ICD-10-CM | POA: Diagnosis not present

## 2020-04-26 ENCOUNTER — Other Ambulatory Visit: Payer: Self-pay

## 2020-04-26 ENCOUNTER — Telehealth: Payer: Self-pay | Admitting: Nurse Practitioner

## 2020-04-26 ENCOUNTER — Other Ambulatory Visit: Payer: Medicare Other | Admitting: Nurse Practitioner

## 2020-04-26 DIAGNOSIS — Z515 Encounter for palliative care: Secondary | ICD-10-CM

## 2020-04-26 DIAGNOSIS — I5033 Acute on chronic diastolic (congestive) heart failure: Secondary | ICD-10-CM | POA: Diagnosis not present

## 2020-04-26 DIAGNOSIS — I471 Supraventricular tachycardia: Secondary | ICD-10-CM | POA: Diagnosis not present

## 2020-04-26 DIAGNOSIS — I2510 Atherosclerotic heart disease of native coronary artery without angina pectoris: Secondary | ICD-10-CM | POA: Diagnosis not present

## 2020-04-26 DIAGNOSIS — I4819 Other persistent atrial fibrillation: Secondary | ICD-10-CM | POA: Diagnosis not present

## 2020-04-26 DIAGNOSIS — E1142 Type 2 diabetes mellitus with diabetic polyneuropathy: Secondary | ICD-10-CM | POA: Diagnosis not present

## 2020-04-26 DIAGNOSIS — E2740 Unspecified adrenocortical insufficiency: Secondary | ICD-10-CM | POA: Diagnosis not present

## 2020-04-26 DIAGNOSIS — K5790 Diverticulosis of intestine, part unspecified, without perforation or abscess without bleeding: Secondary | ICD-10-CM | POA: Diagnosis not present

## 2020-04-26 DIAGNOSIS — F419 Anxiety disorder, unspecified: Secondary | ICD-10-CM | POA: Diagnosis not present

## 2020-04-26 DIAGNOSIS — E1165 Type 2 diabetes mellitus with hyperglycemia: Secondary | ICD-10-CM

## 2020-04-26 DIAGNOSIS — H409 Unspecified glaucoma: Secondary | ICD-10-CM | POA: Diagnosis not present

## 2020-04-26 DIAGNOSIS — Z7901 Long term (current) use of anticoagulants: Secondary | ICD-10-CM | POA: Diagnosis not present

## 2020-04-26 DIAGNOSIS — J449 Chronic obstructive pulmonary disease, unspecified: Secondary | ICD-10-CM | POA: Diagnosis not present

## 2020-04-26 DIAGNOSIS — K219 Gastro-esophageal reflux disease without esophagitis: Secondary | ICD-10-CM | POA: Diagnosis not present

## 2020-04-26 DIAGNOSIS — E039 Hypothyroidism, unspecified: Secondary | ICD-10-CM | POA: Diagnosis not present

## 2020-04-26 DIAGNOSIS — N183 Chronic kidney disease, stage 3 unspecified: Secondary | ICD-10-CM | POA: Diagnosis not present

## 2020-04-26 DIAGNOSIS — D509 Iron deficiency anemia, unspecified: Secondary | ICD-10-CM | POA: Diagnosis not present

## 2020-04-26 DIAGNOSIS — E538 Deficiency of other specified B group vitamins: Secondary | ICD-10-CM | POA: Diagnosis not present

## 2020-04-26 DIAGNOSIS — M1990 Unspecified osteoarthritis, unspecified site: Secondary | ICD-10-CM | POA: Diagnosis not present

## 2020-04-26 DIAGNOSIS — K449 Diaphragmatic hernia without obstruction or gangrene: Secondary | ICD-10-CM | POA: Diagnosis not present

## 2020-04-26 DIAGNOSIS — G8929 Other chronic pain: Secondary | ICD-10-CM | POA: Diagnosis not present

## 2020-04-26 DIAGNOSIS — I130 Hypertensive heart and chronic kidney disease with heart failure and stage 1 through stage 4 chronic kidney disease, or unspecified chronic kidney disease: Secondary | ICD-10-CM | POA: Diagnosis not present

## 2020-04-26 DIAGNOSIS — E1122 Type 2 diabetes mellitus with diabetic chronic kidney disease: Secondary | ICD-10-CM | POA: Diagnosis not present

## 2020-04-26 DIAGNOSIS — Z6841 Body Mass Index (BMI) 40.0 and over, adult: Secondary | ICD-10-CM | POA: Diagnosis not present

## 2020-04-26 DIAGNOSIS — M549 Dorsalgia, unspecified: Secondary | ICD-10-CM | POA: Diagnosis not present

## 2020-04-26 DIAGNOSIS — E782 Mixed hyperlipidemia: Secondary | ICD-10-CM | POA: Diagnosis not present

## 2020-04-26 MED ORDER — LEVEMIR FLEXTOUCH 100 UNIT/ML ~~LOC~~ SOPN: 30.0000 [IU] | PEN_INJECTOR | Freq: Two times a day (BID) | SUBCUTANEOUS | 0 refills | Status: DC

## 2020-04-26 NOTE — Progress Notes (Signed)
    [facility] Palliative Care Consult Note Telephone: [phone]  Fax: [phone]  PATIENT NAME: Ann [name] [address] [place] [address] [phone] (home)  DOB: 09-Aug-1945 MRN: [id number]  PRIMARY CARE PROVIDER:    Rosita Fire, MD,  [place] [address] [phone]  REFERRING PROVIDER:   Rosita Fire, [place],  [place] [address] [phone]  RESPONSIBLE PARTY:   Extended Emergency Contact Information Primary Emergency Contact: Dobrowolski,Carl Address: [address] [place]          [place], [place] [address] [place] Phone: [phone] Relation: Spouse Secondary Emergency Contact: P[place]ie, [place], [place] [address] [place] of [place] Phone: [phone] Mobile Phone: [phone] Relation: Son  Patient prefer, and have given verbal consent for, a provider visit via telehealth, from my office. HIPPA policies of confidentially were discussed.    Advance Care Planning: Per request from Palliative care social worker, patient requested to speak with a provider to review her advance care planning decision. Today patient verbalized desire to change her code status from Do Not Resuscitate to Full Code. Patient verbalized desire to be resuscitated in the event of cardiac or respiratory arrest. Patient however does not want long-term ventilator support, saying she wants to be on the vent long enough for her children and grandchildren from out of state to see her. The need to complete a MOST form was discussed, patient verbalized interest in completing a MOST form. Discussed and reviewed sections of the form in detail, opportunity for questions given, all questions answered. Form completed and signed with patient over the telephone. Signed form will be given to social worker [place] to take to patient, so patient can sign her section.  Patient  advised to keep form in home and take with her to her medical appointments.  I spent 17 minutes providing this consultation.  More than 50% of the time in this consultation was spent on counseling and coordinating communication.    Jari Favre, DNP, AGPCNP-BC

## 2020-04-28 ENCOUNTER — Telehealth: Payer: Self-pay

## 2020-04-28 DIAGNOSIS — I5033 Acute on chronic diastolic (congestive) heart failure: Secondary | ICD-10-CM | POA: Diagnosis not present

## 2020-04-28 DIAGNOSIS — E1122 Type 2 diabetes mellitus with diabetic chronic kidney disease: Secondary | ICD-10-CM | POA: Diagnosis not present

## 2020-04-28 DIAGNOSIS — I2510 Atherosclerotic heart disease of native coronary artery without angina pectoris: Secondary | ICD-10-CM | POA: Diagnosis not present

## 2020-04-28 DIAGNOSIS — J449 Chronic obstructive pulmonary disease, unspecified: Secondary | ICD-10-CM | POA: Diagnosis not present

## 2020-04-28 DIAGNOSIS — I130 Hypertensive heart and chronic kidney disease with heart failure and stage 1 through stage 4 chronic kidney disease, or unspecified chronic kidney disease: Secondary | ICD-10-CM | POA: Diagnosis not present

## 2020-04-28 DIAGNOSIS — N183 Chronic kidney disease, stage 3 unspecified: Secondary | ICD-10-CM | POA: Diagnosis not present

## 2020-04-28 NOTE — Telephone Encounter (Signed)
158 pm.  Phone call made to patient to schedule an in-person visit.  No answer but message has been left on VM requesting a call back.  PLAN: Awaiting call back but if no call back, I will reach out to patient again.

## 2020-05-02 ENCOUNTER — Telehealth: Payer: Self-pay

## 2020-05-02 DIAGNOSIS — L603 Nail dystrophy: Secondary | ICD-10-CM | POA: Diagnosis not present

## 2020-05-02 DIAGNOSIS — J449 Chronic obstructive pulmonary disease, unspecified: Secondary | ICD-10-CM | POA: Diagnosis not present

## 2020-05-02 DIAGNOSIS — E1122 Type 2 diabetes mellitus with diabetic chronic kidney disease: Secondary | ICD-10-CM | POA: Diagnosis not present

## 2020-05-02 DIAGNOSIS — I130 Hypertensive heart and chronic kidney disease with heart failure and stage 1 through stage 4 chronic kidney disease, or unspecified chronic kidney disease: Secondary | ICD-10-CM | POA: Diagnosis not present

## 2020-05-02 DIAGNOSIS — N183 Chronic kidney disease, stage 3 unspecified: Secondary | ICD-10-CM | POA: Diagnosis not present

## 2020-05-02 DIAGNOSIS — E1142 Type 2 diabetes mellitus with diabetic polyneuropathy: Secondary | ICD-10-CM | POA: Diagnosis not present

## 2020-05-02 DIAGNOSIS — L851 Acquired keratosis [keratoderma] palmaris et plantaris: Secondary | ICD-10-CM | POA: Diagnosis not present

## 2020-05-02 DIAGNOSIS — I2510 Atherosclerotic heart disease of native coronary artery without angina pectoris: Secondary | ICD-10-CM | POA: Diagnosis not present

## 2020-05-02 DIAGNOSIS — I5033 Acute on chronic diastolic (congestive) heart failure: Secondary | ICD-10-CM | POA: Diagnosis not present

## 2020-05-02 NOTE — Telephone Encounter (Signed)
853 am.  Phone call made to patient to schedule an in-person visit.  No answer and message has been left on VM requesting a call back.  PLAN:  Awaiting call back from patient.  If no call back, PC will reach out to patient again in a couple of weeks.

## 2020-05-03 DIAGNOSIS — J441 Chronic obstructive pulmonary disease with (acute) exacerbation: Secondary | ICD-10-CM | POA: Diagnosis not present

## 2020-05-03 DIAGNOSIS — E1165 Type 2 diabetes mellitus with hyperglycemia: Secondary | ICD-10-CM | POA: Diagnosis not present

## 2020-05-04 DIAGNOSIS — Z23 Encounter for immunization: Secondary | ICD-10-CM | POA: Diagnosis not present

## 2020-05-11 ENCOUNTER — Encounter: Payer: Self-pay | Admitting: Internal Medicine

## 2020-05-11 ENCOUNTER — Ambulatory Visit (INDEPENDENT_AMBULATORY_CARE_PROVIDER_SITE_OTHER): Payer: Medicare Other | Admitting: Internal Medicine

## 2020-05-11 ENCOUNTER — Other Ambulatory Visit: Payer: Self-pay

## 2020-05-11 VITALS — BP 161/87 | HR 119 | Temp 96.9°F | Ht 63.0 in | Wt 280.0 lb

## 2020-05-11 DIAGNOSIS — K219 Gastro-esophageal reflux disease without esophagitis: Secondary | ICD-10-CM | POA: Diagnosis not present

## 2020-05-11 DIAGNOSIS — K582 Mixed irritable bowel syndrome: Secondary | ICD-10-CM | POA: Diagnosis not present

## 2020-05-11 MED ORDER — ESOMEPRAZOLE MAGNESIUM 40 MG PO CPDR: 40.0000 mg | DELAYED_RELEASE_CAPSULE | Freq: Two times a day (BID) | ORAL | 3 refills | Status: DC
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
## 2020-05-11 NOTE — Patient Instructions (Signed)
We will change your pantoprazole to Nexium 40 mg twice daily.  Take 30 minutes before breakfast and 30 minutes before dinner for optimal response.  Follow-up in 1 year or sooner if needed.  Lifestyle and home remedies TO MANAGE REFLUX/HEARTBURN    You may eliminate or reduce the frequency of heartburn by making the following lifestyle changes:    Control your weight. Being overweight is a major risk factor for heartburn and GERD. Excess pounds put pressure on your abdomen, pushing up your stomach and causing acid to back up into your esophagus.     Eat smaller meals. 4 TO 6 MEALS A DAY. This reduces pressure on the lower esophageal sphincter, helping to prevent the valve from opening and acid from washing back into your esophagus.      Loosen your belt. Clothes that fit tightly around your waist put pressure on your abdomen and the lower esophageal sphincter.      Eliminate heartburn triggers. Everyone has specific triggers. Common triggers such as fatty or fried foods, spicy food, tomato sauce, carbonated beverages, alcohol, chocolate, mint, garlic, onion, caffeine and nicotine may make heartburn worse.     Avoid stooping or bending. Tying your shoes is OK. Bending over for longer periods to weed your garden isn't, especially soon after eating.     Don't lie down after a meal. Wait at least three to four hours after eating before going to bed, and don't lie down right after eating.   At [facility] we value your feedback. You may receive a survey about your visit today. Please share your experience as we strive to create trusting relationships with our patients to provide genuine, compassionate, quality care.  We appreciate your understanding and patience as we review any laboratory studies, imaging, and other diagnostic tests that are ordered as we care for you. Our office policy is 5 business days for review of these results, and any emergent or urgent results are  addressed in a timely manner for your best interest. If you do not hear from our office in 1 week, please contact us.   We also encourage the use of MyChart, which contains your medical information for your review as well. If you are not enrolled in this feature, an access code is on this after visit summary for your convenience. Thank you for allowing [place] to be involved in your care.  It was great to see you today!  I hope you have a great rest of your winter!!    [place]on Alas. Abbey Chatters, D.O. Gastroenterology and Hepatology [facility]

## 2020-05-12 ENCOUNTER — Telehealth: Payer: Self-pay | Admitting: Cardiology

## 2020-05-12 DIAGNOSIS — E1122 Type 2 diabetes mellitus with diabetic chronic kidney disease: Secondary | ICD-10-CM | POA: Diagnosis not present

## 2020-05-12 DIAGNOSIS — Z79899 Other long term (current) drug therapy: Secondary | ICD-10-CM

## 2020-05-12 DIAGNOSIS — I130 Hypertensive heart and chronic kidney disease with heart failure and stage 1 through stage 4 chronic kidney disease, or unspecified chronic kidney disease: Secondary | ICD-10-CM | POA: Diagnosis not present

## 2020-05-12 DIAGNOSIS — J449 Chronic obstructive pulmonary disease, unspecified: Secondary | ICD-10-CM | POA: Diagnosis not present

## 2020-05-12 DIAGNOSIS — I5033 Acute on chronic diastolic (congestive) heart failure: Secondary | ICD-10-CM | POA: Diagnosis not present

## 2020-05-12 DIAGNOSIS — N183 Chronic kidney disease, stage 3 unspecified: Secondary | ICD-10-CM | POA: Diagnosis not present

## 2020-05-12 DIAGNOSIS — I2510 Atherosclerotic heart disease of native coronary artery without angina pectoris: Secondary | ICD-10-CM | POA: Diagnosis not present

## 2020-05-12 MED ORDER — FUROSEMIDE 40 MG PO TABS: 40.0000 mg | ORAL_TABLET | Freq: Two times a day (BID) | ORAL | 11 refills | Status: DC

## 2020-05-12 NOTE — Telephone Encounter (Signed)
Spoke with [facility] and was told that pt has increased in weight over the last week. On 12/7 she weighed 265.3lbs and today she weighs 279.8 lbs. Pt denies chest pain and increased SOB. Janett Billow reports that pt has +2 edema in the left leg and +1 edema in the right leg. Janett Billow reports O2 sat of 91%, BP 140/80 and HR of 90. Janett Billow can be reached at [phone].

## 2020-05-12 NOTE — Addendum Note (Signed)
Addended by: Levonne Hubert on: 05/12/2020 12:46 PM   Modules accepted: Orders

## 2020-05-12 NOTE — Telephone Encounter (Signed)
Verify taking lasix 40mg  daily, if so increase to 40mg  bid. Needs BMET/Mg in 2 weeks, PA visit 1-2 weeks   Zandra Abts MD

## 2020-05-12 NOTE — Telephone Encounter (Signed)
New message     Pt c/o swelling: STAT is pt has developed SOB within 24 hours  1) How much weight have you gained and in what time span? 14.5 lbs in 2 weeks   2) If swelling, where is the swelling located? Face , legs , pit adema in legs   3) Are you currently taking a fluid pill? yes  4) Are you currently SOB?  no  5) Do you have a log of your daily weights (if so, list)?  No   6) Have you gained 3 pounds in a day or 5 pounds in a week?  yes  7) Have you traveled recently? no

## 2020-05-12 NOTE — Telephone Encounter (Signed)
[facility] Jessica notified and voiced understanding. Attempted to call pt with appt. No answer, voicemail full. Will try again later.

## 2020-05-12 NOTE — Telephone Encounter (Signed)
Pt notified of appt, medication adjustments and the need for lab work. Pt voiced understanding.

## 2020-05-14 ENCOUNTER — Observation Stay (HOSPITAL_COMMUNITY): Payer: Medicare Other

## 2020-05-14 ENCOUNTER — Encounter (HOSPITAL_COMMUNITY): Payer: Self-pay | Admitting: Emergency Medicine

## 2020-05-14 ENCOUNTER — Inpatient Hospital Stay (HOSPITAL_COMMUNITY)
Admission: EM | Admit: 2020-05-14 | Discharge: 2020-05-25 | DRG: 853 | Disposition: A | Discharge status: Rehabilitation Facility | Payer: Medicare Other | Attending: Family Medicine | Admitting: Family Medicine

## 2020-05-14 ENCOUNTER — Other Ambulatory Visit: Payer: Self-pay

## 2020-05-14 DIAGNOSIS — K922 Gastrointestinal hemorrhage, unspecified: Secondary | ICD-10-CM | POA: Diagnosis present

## 2020-05-14 DIAGNOSIS — R571 Hypovolemic shock: Secondary | ICD-10-CM | POA: Diagnosis not present

## 2020-05-14 DIAGNOSIS — Z886 Allergy status to analgesic agent status: Secondary | ICD-10-CM

## 2020-05-14 DIAGNOSIS — A4181 Sepsis due to Enterococcus: Principal | ICD-10-CM | POA: Diagnosis present

## 2020-05-14 DIAGNOSIS — A419 Sepsis, unspecified organism: Secondary | ICD-10-CM | POA: Diagnosis present

## 2020-05-14 DIAGNOSIS — Z7901 Long term (current) use of anticoagulants: Secondary | ICD-10-CM | POA: Diagnosis not present

## 2020-05-14 DIAGNOSIS — Z01818 Encounter for other preprocedural examination: Secondary | ICD-10-CM

## 2020-05-14 DIAGNOSIS — Z800 Family history of malignant neoplasm of digestive organs: Secondary | ICD-10-CM

## 2020-05-14 DIAGNOSIS — Z955 Presence of coronary angioplasty implant and graft: Secondary | ICD-10-CM

## 2020-05-14 DIAGNOSIS — K5730 Diverticulosis of large intestine without perforation or abscess without bleeding: Secondary | ICD-10-CM | POA: Diagnosis not present

## 2020-05-14 DIAGNOSIS — M1990 Unspecified osteoarthritis, unspecified site: Secondary | ICD-10-CM | POA: Diagnosis present

## 2020-05-14 DIAGNOSIS — K625 Hemorrhage of anus and rectum: Secondary | ICD-10-CM

## 2020-05-14 DIAGNOSIS — R58 Hemorrhage, not elsewhere classified: Secondary | ICD-10-CM | POA: Diagnosis not present

## 2020-05-14 DIAGNOSIS — T508X5A Adverse effect of diagnostic agents, initial encounter: Secondary | ICD-10-CM | POA: Diagnosis not present

## 2020-05-14 DIAGNOSIS — Z794 Long term (current) use of insulin: Secondary | ICD-10-CM

## 2020-05-14 DIAGNOSIS — G8929 Other chronic pain: Secondary | ICD-10-CM | POA: Diagnosis present

## 2020-05-14 DIAGNOSIS — Z751 Person awaiting admission to adequate facility elsewhere: Secondary | ICD-10-CM

## 2020-05-14 DIAGNOSIS — I130 Hypertensive heart and chronic kidney disease with heart failure and stage 1 through stage 4 chronic kidney disease, or unspecified chronic kidney disease: Secondary | ICD-10-CM | POA: Diagnosis present

## 2020-05-14 DIAGNOSIS — K5721 Diverticulitis of large intestine with perforation and abscess with bleeding: Secondary | ICD-10-CM | POA: Diagnosis not present

## 2020-05-14 DIAGNOSIS — D649 Anemia, unspecified: Secondary | ICD-10-CM | POA: Diagnosis present

## 2020-05-14 DIAGNOSIS — I482 Chronic atrial fibrillation, unspecified: Secondary | ICD-10-CM | POA: Diagnosis present

## 2020-05-14 DIAGNOSIS — E861 Hypovolemia: Secondary | ICD-10-CM | POA: Diagnosis present

## 2020-05-14 DIAGNOSIS — Z8249 Family history of ischemic heart disease and other diseases of the circulatory system: Secondary | ICD-10-CM

## 2020-05-14 DIAGNOSIS — Z95828 Presence of other vascular implants and grafts: Secondary | ICD-10-CM

## 2020-05-14 DIAGNOSIS — R6521 Severe sepsis with septic shock: Secondary | ICD-10-CM | POA: Diagnosis not present

## 2020-05-14 DIAGNOSIS — Z86718 Personal history of other venous thrombosis and embolism: Secondary | ICD-10-CM

## 2020-05-14 DIAGNOSIS — I10 Essential (primary) hypertension: Secondary | ICD-10-CM | POA: Diagnosis present

## 2020-05-14 DIAGNOSIS — Z20822 Contact with and (suspected) exposure to covid-19: Secondary | ICD-10-CM | POA: Diagnosis present

## 2020-05-14 DIAGNOSIS — Z87891 Personal history of nicotine dependence: Secondary | ICD-10-CM

## 2020-05-14 DIAGNOSIS — I25118 Atherosclerotic heart disease of native coronary artery with other forms of angina pectoris: Secondary | ICD-10-CM | POA: Diagnosis present

## 2020-05-14 DIAGNOSIS — K5792 Diverticulitis of intestine, part unspecified, without perforation or abscess without bleeding: Secondary | ICD-10-CM | POA: Diagnosis present

## 2020-05-14 DIAGNOSIS — I480 Paroxysmal atrial fibrillation: Secondary | ICD-10-CM | POA: Diagnosis present

## 2020-05-14 DIAGNOSIS — Z79890 Hormone replacement therapy (postmenopausal): Secondary | ICD-10-CM

## 2020-05-14 DIAGNOSIS — K449 Diaphragmatic hernia without obstruction or gangrene: Secondary | ICD-10-CM | POA: Diagnosis present

## 2020-05-14 DIAGNOSIS — N179 Acute kidney failure, unspecified: Secondary | ICD-10-CM | POA: Diagnosis not present

## 2020-05-14 DIAGNOSIS — K219 Gastro-esophageal reflux disease without esophagitis: Secondary | ICD-10-CM | POA: Diagnosis present

## 2020-05-14 DIAGNOSIS — I5032 Chronic diastolic (congestive) heart failure: Secondary | ICD-10-CM | POA: Diagnosis present

## 2020-05-14 DIAGNOSIS — R1115 Cyclical vomiting syndrome unrelated to migraine: Secondary | ICD-10-CM

## 2020-05-14 DIAGNOSIS — E1165 Type 2 diabetes mellitus with hyperglycemia: Secondary | ICD-10-CM | POA: Diagnosis present

## 2020-05-14 DIAGNOSIS — H409 Unspecified glaucoma: Secondary | ICD-10-CM | POA: Diagnosis present

## 2020-05-14 DIAGNOSIS — R0902 Hypoxemia: Secondary | ICD-10-CM | POA: Diagnosis not present

## 2020-05-14 DIAGNOSIS — I959 Hypotension, unspecified: Secondary | ICD-10-CM | POA: Diagnosis not present

## 2020-05-14 DIAGNOSIS — Z801 Family history of malignant neoplasm of trachea, bronchus and lung: Secondary | ICD-10-CM

## 2020-05-14 DIAGNOSIS — Z7952 Long term (current) use of systemic steroids: Secondary | ICD-10-CM

## 2020-05-14 DIAGNOSIS — M549 Dorsalgia, unspecified: Secondary | ICD-10-CM | POA: Diagnosis present

## 2020-05-14 DIAGNOSIS — E1142 Type 2 diabetes mellitus with diabetic polyneuropathy: Secondary | ICD-10-CM | POA: Diagnosis present

## 2020-05-14 DIAGNOSIS — I4891 Unspecified atrial fibrillation: Secondary | ICD-10-CM | POA: Diagnosis present

## 2020-05-14 DIAGNOSIS — Z888 Allergy status to other drugs, medicaments and biological substances status: Secondary | ICD-10-CM

## 2020-05-14 DIAGNOSIS — Z6841 Body Mass Index (BMI) 40.0 and over, adult: Secondary | ICD-10-CM

## 2020-05-14 DIAGNOSIS — R42 Dizziness and giddiness: Secondary | ICD-10-CM | POA: Diagnosis not present

## 2020-05-14 DIAGNOSIS — E1122 Type 2 diabetes mellitus with diabetic chronic kidney disease: Secondary | ICD-10-CM | POA: Diagnosis present

## 2020-05-14 DIAGNOSIS — Z66 Do not resuscitate: Secondary | ICD-10-CM | POA: Diagnosis not present

## 2020-05-14 DIAGNOSIS — E538 Deficiency of other specified B group vitamins: Secondary | ICD-10-CM | POA: Diagnosis present

## 2020-05-14 DIAGNOSIS — E039 Hypothyroidism, unspecified: Secondary | ICD-10-CM | POA: Diagnosis present

## 2020-05-14 DIAGNOSIS — J449 Chronic obstructive pulmonary disease, unspecified: Secondary | ICD-10-CM | POA: Diagnosis present

## 2020-05-14 DIAGNOSIS — E2749 Other adrenocortical insufficiency: Secondary | ICD-10-CM | POA: Diagnosis present

## 2020-05-14 DIAGNOSIS — R32 Unspecified urinary incontinence: Secondary | ICD-10-CM | POA: Diagnosis not present

## 2020-05-14 DIAGNOSIS — K660 Peritoneal adhesions (postprocedural) (postinfection): Secondary | ICD-10-CM | POA: Diagnosis present

## 2020-05-14 DIAGNOSIS — E2740 Unspecified adrenocortical insufficiency: Secondary | ICD-10-CM | POA: Diagnosis present

## 2020-05-14 DIAGNOSIS — E785 Hyperlipidemia, unspecified: Secondary | ICD-10-CM | POA: Diagnosis present

## 2020-05-14 DIAGNOSIS — R000 Tachycardia, unspecified: Secondary | ICD-10-CM | POA: Diagnosis not present

## 2020-05-14 DIAGNOSIS — Z79899 Other long term (current) drug therapy: Secondary | ICD-10-CM

## 2020-05-14 DIAGNOSIS — R109 Unspecified abdominal pain: Secondary | ICD-10-CM | POA: Diagnosis not present

## 2020-05-14 DIAGNOSIS — D62 Acute posthemorrhagic anemia: Secondary | ICD-10-CM | POA: Diagnosis not present

## 2020-05-14 LAB — GLUCOSE, CAPILLARY
Glucose-Capillary: 96 mg/dL (ref 70–99)
Glucose-Capillary: 139 mg/dL — ABNORMAL HIGH (ref 70–99)
Glucose-Capillary: 176 mg/dL — ABNORMAL HIGH (ref 70–99)

## 2020-05-14 LAB — CBC
HCT: 29.8 % — ABNORMAL LOW (ref 36.0–46.0)
Hemoglobin: 8.5 g/dL — ABNORMAL LOW (ref 12.0–15.0)
MCH: 23.9 pg — ABNORMAL LOW (ref 26.0–34.0)
MCHC: 28.5 g/dL — ABNORMAL LOW (ref 30.0–36.0)
MCV: 83.9 fL (ref 80.0–100.0)
Platelets: 343 10*3/uL (ref 150–400)
RBC: 3.55 MIL/uL — ABNORMAL LOW (ref 3.87–5.11)
RDW: 18.2 % — ABNORMAL HIGH (ref 11.5–15.5)
WBC: 16.9 10*3/uL — ABNORMAL HIGH (ref 4.0–10.5)
nRBC: 0 % (ref 0.0–0.2)

## 2020-05-14 LAB — COMPREHENSIVE METABOLIC PANEL
ALT: 14 U/L (ref 0–44)
AST: 20 U/L (ref 15–41)
Albumin: 3.7 g/dL (ref 3.5–5.0)
Alkaline Phosphatase: 54 U/L (ref 38–126)
Anion gap: 10 (ref 5–15)
BUN: 18 mg/dL (ref 8–23)
CO2: 30 mmol/L (ref 22–32)
Calcium: 8.6 mg/dL — ABNORMAL LOW (ref 8.9–10.3)
Chloride: 99 mmol/L (ref 98–111)
Creatinine, Ser: 1.07 mg/dL — ABNORMAL HIGH (ref 0.44–1.00)
GFR, Estimated: 55 mL/min — ABNORMAL LOW (ref >60)
Glucose, Bld: 128 mg/dL — ABNORMAL HIGH (ref 70–99)
Potassium: 3.9 mmol/L (ref 3.5–5.1)
Sodium: 139 mmol/L (ref 135–145)
Total Bilirubin: 0.5 mg/dL (ref 0.3–1.2)
Total Protein: 7 g/dL (ref 6.5–8.1)

## 2020-05-14 LAB — CBG MONITORING, ED: Glucose-Capillary: 104 mg/dL — ABNORMAL HIGH (ref 70–99)

## 2020-05-14 LAB — RESP PANEL BY RT-PCR (FLU A&B, COVID) ARPGX2
Influenza A by PCR: NEGATIVE
Influenza B by PCR: NEGATIVE
SARS Coronavirus 2 by RT PCR: NEGATIVE

## 2020-05-14 LAB — POC OCCULT BLOOD, ED: Fecal Occult Bld: POSITIVE — AB

## 2020-05-14 LAB — PREPARE RBC (CROSSMATCH)

## 2020-05-14 LAB — PROTIME-INR
INR: 1.4 — ABNORMAL HIGH (ref 0.8–1.2)
Prothrombin Time: 16.4 seconds — ABNORMAL HIGH (ref 11.4–15.2)

## 2020-05-14 MED ORDER — POLYETHYLENE GLYCOL 3350 17 G PO PACK: 17.0000 g | PACK | Freq: Every day | ORAL | Status: DC | PRN

## 2020-05-14 MED ORDER — POLYVINYL ALCOHOL 1.4 % OP SOLN
2.0000 [drp] | Freq: Every day | OPHTHALMIC | Status: DC | PRN
  Filled 2020-05-14: qty 15

## 2020-05-14 MED ORDER — IOHEXOL 300 MG/ML  SOLN
100.0000 mL | Freq: Once | INTRAMUSCULAR | Status: AC | PRN
  Administered 2020-05-14: 19:00:00 100 mL via INTRAVENOUS

## 2020-05-14 MED ORDER — SODIUM CHLORIDE 0.9% FLUSH
3.0000 mL | Freq: Two times a day (BID) | INTRAVENOUS | Status: DC
  Administered 2020-05-14 – 2020-05-22 (×9): 3 mL via INTRAVENOUS

## 2020-05-14 MED ORDER — INSULIN DETEMIR 100 UNIT/ML ~~LOC~~ SOLN
30.0000 [IU] | Freq: Every day | SUBCUTANEOUS | Status: DC
  Administered 2020-05-14 – 2020-05-15 (×2): 30 [IU] via SUBCUTANEOUS
  Filled 2020-05-14 (×3): qty 0.3

## 2020-05-14 MED ORDER — INSULIN ASPART 100 UNIT/ML ~~LOC~~ SOLN
0.0000 [IU] | Freq: Three times a day (TID) | SUBCUTANEOUS | Status: DC
  Administered 2020-05-15: 12:00:00 2 [IU] via SUBCUTANEOUS

## 2020-05-14 MED ORDER — SODIUM CHLORIDE 0.9 % IV SOLN: INTRAVENOUS | Status: AC

## 2020-05-14 MED ORDER — INSULIN ASPART 100 UNIT/ML ~~LOC~~ SOLN: 0.0000 [IU] | Freq: Every day | SUBCUTANEOUS | Status: DC

## 2020-05-14 MED ORDER — MIRABEGRON ER 25 MG PO TB24
50.0000 mg | ORAL_TABLET | Freq: Every day | ORAL | Status: DC
  Administered 2020-05-15 – 2020-05-25 (×10): 50 mg via ORAL
  Filled 2020-05-14 (×3): qty 2
  Filled 2020-05-14: qty 1
  Filled 2020-05-14 (×8): qty 2

## 2020-05-14 MED ORDER — FUROSEMIDE 10 MG/ML IJ SOLN
60.0000 mg | Freq: Once | INTRAMUSCULAR | Status: AC
  Administered 2020-05-14: 18:00:00 60 mg via INTRAVENOUS
  Filled 2020-05-14: qty 6

## 2020-05-14 MED ORDER — SODIUM CHLORIDE 0.9% FLUSH
3.0000 mL | INTRAVENOUS | Status: DC | PRN
  Administered 2020-05-25: 09:00:00 3 mL via INTRAVENOUS

## 2020-05-14 MED ORDER — SODIUM CHLORIDE 0.9 % IV SOLN
10.0000 mL/h | Freq: Once | INTRAVENOUS | Status: AC
  Administered 2020-05-14: 13:00:00 10 mL/h via INTRAVENOUS

## 2020-05-14 MED ORDER — ACETAMINOPHEN 650 MG RE SUPP: 650.0000 mg | Freq: Four times a day (QID) | RECTAL | Status: DC | PRN

## 2020-05-14 MED ORDER — ACETAMINOPHEN 325 MG PO TABS
650.0000 mg | ORAL_TABLET | Freq: Four times a day (QID) | ORAL | Status: DC | PRN
  Administered 2020-05-14 – 2020-05-18 (×2): 650 mg via ORAL
  Filled 2020-05-14 (×2): qty 2

## 2020-05-14 MED ORDER — ATORVASTATIN CALCIUM 40 MG PO TABS
80.0000 mg | ORAL_TABLET | Freq: Every day | ORAL | Status: DC
  Administered 2020-05-14 – 2020-05-24 (×9): 80 mg via ORAL
  Filled 2020-05-14 (×10): qty 2

## 2020-05-14 MED ORDER — METOPROLOL TARTRATE 25 MG PO TABS: 25.0000 mg | ORAL_TABLET | Freq: Two times a day (BID) | ORAL | Status: DC

## 2020-05-14 MED ORDER — IOHEXOL 9 MG/ML PO SOLN
ORAL | Status: AC
  Administered 2020-05-14: 18:00:00 1000 mL
  Filled 2020-05-14: qty 1000

## 2020-05-14 MED ORDER — FUROSEMIDE 40 MG PO TABS: 40.0000 mg | ORAL_TABLET | Freq: Every day | ORAL | Status: DC

## 2020-05-14 MED ORDER — DARIFENACIN HYDROBROMIDE ER 7.5 MG PO TB24
7.5000 mg | ORAL_TABLET | Freq: Every day | ORAL | Status: DC
  Administered 2020-05-15 – 2020-05-25 (×10): 7.5 mg via ORAL
  Filled 2020-05-14 (×11): qty 1

## 2020-05-14 MED ORDER — VITAMIN D 25 MCG (1000 UNIT) PO TABS
5000.0000 [IU] | ORAL_TABLET | Freq: Every day | ORAL | Status: DC
  Administered 2020-05-15 – 2020-05-25 (×10): 5000 [IU] via ORAL
  Filled 2020-05-14 (×15): qty 5

## 2020-05-14 MED ORDER — TIOTROPIUM BROMIDE MONOHYDRATE 2.5 MCG/ACT IN AERS: 2.0000 | INHALATION_SPRAY | Freq: Every day | RESPIRATORY_TRACT | Status: DC

## 2020-05-14 MED ORDER — IOHEXOL 9 MG/ML PO SOLN
ORAL | Status: AC
  Filled 2020-05-14: qty 6000

## 2020-05-14 MED ORDER — BISACODYL 10 MG RE SUPP: 10.0000 mg | Freq: Every day | RECTAL | Status: DC | PRN

## 2020-05-14 MED ORDER — DILTIAZEM HCL ER COATED BEADS 180 MG PO CP24: 300.0000 mg | ORAL_CAPSULE | Freq: Every day | ORAL | Status: DC

## 2020-05-14 MED ORDER — PIPERACILLIN-TAZOBACTAM 3.375 G IVPB
3.3750 g | Freq: Once | INTRAVENOUS | Status: AC
  Administered 2020-05-15: 01:00:00 3.375 g via INTRAVENOUS
  Filled 2020-05-14: qty 50

## 2020-05-14 MED ORDER — SODIUM CHLORIDE 0.9 % IV SOLN
250.0000 mL | INTRAVENOUS | Status: DC | PRN
  Administered 2020-05-23: 18:00:00 250 mL via INTRAVENOUS

## 2020-05-14 MED ORDER — DILTIAZEM HCL ER COATED BEADS 180 MG PO CP24
300.0000 mg | ORAL_CAPSULE | Freq: Every day | ORAL | Status: DC
  Filled 2020-05-14: qty 1

## 2020-05-14 MED ORDER — MORPHINE SULFATE (PF) 2 MG/ML IV SOLN
2.0000 mg | INTRAVENOUS | Status: DC | PRN
  Administered 2020-05-15 – 2020-05-16 (×2): 2 mg via INTRAVENOUS
  Filled 2020-05-14 (×2): qty 1

## 2020-05-14 MED ORDER — DARIFENACIN HYDROBROMIDE ER 7.5 MG PO TB24
7.5000 mg | ORAL_TABLET | Freq: Every day | ORAL | Status: DC
  Filled 2020-05-14 (×2): qty 1

## 2020-05-14 MED ORDER — ONDANSETRON HCL 4 MG/2ML IJ SOLN
4.0000 mg | Freq: Four times a day (QID) | INTRAMUSCULAR | Status: DC | PRN
  Administered 2020-05-15 – 2020-05-19 (×5): 4 mg via INTRAVENOUS
  Filled 2020-05-14 (×5): qty 2

## 2020-05-14 MED ORDER — HYDRALAZINE HCL 25 MG PO TABS
50.0000 mg | ORAL_TABLET | Freq: Three times a day (TID) | ORAL | Status: DC
  Filled 2020-05-14 (×2): qty 2

## 2020-05-14 MED ORDER — VITAMIN B-12 1000 MCG PO TABS
1000.0000 ug | ORAL_TABLET | Freq: Every day | ORAL | Status: DC
  Administered 2020-05-15 – 2020-05-25 (×10): 1000 ug via ORAL
  Filled 2020-05-14 (×10): qty 1

## 2020-05-14 MED ORDER — HYDRALAZINE HCL 25 MG PO TABS: 50.0000 mg | ORAL_TABLET | Freq: Three times a day (TID) | ORAL | Status: DC

## 2020-05-14 MED ORDER — METOPROLOL TARTRATE 25 MG PO TABS
25.0000 mg | ORAL_TABLET | Freq: Two times a day (BID) | ORAL | Status: DC
  Administered 2020-05-14: 21:00:00 25 mg via ORAL
  Filled 2020-05-14: qty 1

## 2020-05-14 MED ORDER — ALBUTEROL SULFATE (2.5 MG/3ML) 0.083% IN NEBU: 3.0000 mL | INHALATION_SOLUTION | RESPIRATORY_TRACT | Status: DC | PRN

## 2020-05-14 MED ORDER — MIRABEGRON ER 50 MG PO TB24
50.0000 mg | ORAL_TABLET | Freq: Every day | ORAL | Status: DC
  Filled 2020-05-14 (×2): qty 1

## 2020-05-14 MED ORDER — SERTRALINE HCL 50 MG PO TABS
50.0000 mg | ORAL_TABLET | Freq: Every day | ORAL | Status: DC
  Administered 2020-05-14 – 2020-05-24 (×10): 50 mg via ORAL
  Filled 2020-05-14 (×10): qty 1

## 2020-05-14 MED ORDER — POLYETHYLENE GLYCOL 3350 17 G PO PACK
17.0000 g | PACK | Freq: Two times a day (BID) | ORAL | Status: DC
  Filled 2020-05-14: qty 1

## 2020-05-14 MED ORDER — LACTATED RINGERS IV BOLUS
500.0000 mL | Freq: Once | INTRAVENOUS | Status: AC
  Administered 2020-05-14: 500 mL via INTRAVENOUS

## 2020-05-14 MED ORDER — TRAZODONE HCL 50 MG PO TABS
50.0000 mg | ORAL_TABLET | Freq: Every evening | ORAL | Status: DC | PRN
Start: 2020-05-14 — End: 2020-05-25
  Administered 2020-05-17 – 2020-05-24 (×5): 50 mg via ORAL
  Filled 2020-05-14 (×5): qty 1

## 2020-05-14 MED ORDER — ONDANSETRON HCL 4 MG PO TABS
4.0000 mg | ORAL_TABLET | Freq: Four times a day (QID) | ORAL | Status: DC | PRN
  Administered 2020-05-21: 15:00:00 4 mg via ORAL
  Filled 2020-05-14: qty 1

## 2020-05-14 MED ORDER — SODIUM CHLORIDE 0.9% FLUSH
3.0000 mL | Freq: Two times a day (BID) | INTRAVENOUS | Status: DC
  Administered 2020-05-14 – 2020-05-22 (×9): 3 mL via INTRAVENOUS

## 2020-05-14 MED ORDER — NITROGLYCERIN 0.4 MG SL SUBL: 0.4000 mg | SUBLINGUAL_TABLET | SUBLINGUAL | Status: DC | PRN

## 2020-05-14 MED ORDER — SODIUM CHLORIDE 0.9 % IV BOLUS
500.0000 mL | Freq: Once | INTRAVENOUS | Status: AC
  Administered 2020-05-14: 12:00:00 500 mL via INTRAVENOUS

## 2020-05-14 MED ORDER — UMECLIDINIUM BROMIDE 62.5 MCG/INH IN AEPB
1.0000 | INHALATION_SPRAY | Freq: Every day | RESPIRATORY_TRACT | Status: DC
  Administered 2020-05-14 – 2020-05-25 (×10): 1 via RESPIRATORY_TRACT
  Filled 2020-05-14 (×2): qty 7

## 2020-05-14 MED ORDER — PANTOPRAZOLE SODIUM 40 MG IV SOLR
40.0000 mg | Freq: Two times a day (BID) | INTRAVENOUS | Status: DC
  Administered 2020-05-14 – 2020-05-20 (×12): 40 mg via INTRAVENOUS
  Filled 2020-05-14 (×12): qty 40

## 2020-05-14 MED ORDER — LEVOTHYROXINE SODIUM 75 MCG PO TABS
150.0000 ug | ORAL_TABLET | Freq: Every day | ORAL | Status: DC
  Administered 2020-05-15 – 2020-05-25 (×9): 150 ug via ORAL
  Filled 2020-05-14 (×9): qty 2

## 2020-05-14 MED ORDER — GABAPENTIN 400 MG PO CAPS
400.0000 mg | ORAL_CAPSULE | Freq: Three times a day (TID) | ORAL | Status: DC
  Administered 2020-05-14 – 2020-05-25 (×30): 400 mg via ORAL
  Filled 2020-05-14 (×30): qty 1

## 2020-05-14 NOTE — Progress Notes (Addendum)
Consulted this afternoon regarding this patient. In short she is a 75 year old female with history of diverticulosis who presented with generalized weakness/fatigue as well as rectal bleeding. Found to have a hemoglobin of 8.5 with a baseline around 10. Hemoccult positive. Currently hemodynamically stable.  Recommend admission to hospital. Monitor hemoglobin and transfuse for less than 7 or hemodynamic instability. If patient has evidence of profuse GI bleeding can consider stat CTA to localize bleed. Okay to place on clear liquids today. Agree with IV Protonix. Hold Eliquis. Formal GI consultation in the a.m.  Please call with any questions or concerns.

## 2020-05-14 NOTE — ED Notes (Signed)
Previous IV medications given through IV's placed today per laterality documented

## 2020-05-14 NOTE — ED Triage Notes (Signed)
Pt c/o of lower abdominal pain with rectal bleeding since this morning

## 2020-05-14 NOTE — Progress Notes (Signed)
   05/14/20 1620  Assess: MEWS Score  Temp 98.4 F (36.9 C)  BP 96/80  Pulse Rate (!) 107  Resp 20  Level of Consciousness Alert  SpO2 94 %  O2 Device Nasal Cannula  O2 Flow Rate (L/min) 2 L/min  Assess: MEWS Score  MEWS Temp 0  MEWS Systolic 1  MEWS Pulse 1  MEWS RR 0  MEWS LOC 0  MEWS Score 2  MEWS Score Color Yellow  Assess: if the MEWS score is Yellow or Red  Were vital signs taken at a resting state? Yes  Focused Assessment No change from prior assessment  Early Detection of Sepsis Score *See Row Information* High  MEWS guidelines implemented *See Row Information* Yes  Treat  Pain Scale 0-10  Pain Score 2  Pain Type Acute pain  Pain Location Abdomen  Pain Intervention(s) Repositioned  Take Vital Signs  Increase Vital Sign Frequency  Yellow: Q 2hr X 2 then Q 4hr X 2, if remains yellow, continue Q 4hrs  Escalate  MEWS: Escalate Yellow: discuss with charge nurse/RN and consider discussing with provider and RRT  Notify: Charge Nurse/RN  Name of Charge Nurse/RN Notified Lattie Haw  Date Charge Nurse/RN Notified 05/14/20  Time Charge Nurse/RN Notified 61  Notify: Provider  Provider Name/Title Emokpae  Date Provider Notified 05/14/20  Time Provider Notified 1630  Notification Type Face-to-face  Notification Reason Other (Comment) (yellow mews)  Response See new orders

## 2020-05-14 NOTE — Progress Notes (Addendum)
°  Sepsis due to Acute  Diverticulitis with Perforation--- ' Pt meets Sepsis Criteria -WBC is up to 16.9 ,fevers and tachycardia CT [facility] shows- Sigmoid diverticulitis, with contained perforation measuring 6.9 x 4.5 cm . No evidence of abscess at this time  Rx--give Iv Zosyn, IVF , check lactic acid gen surg consult  Roxan Hockey, MD
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
## 2020-05-15 ENCOUNTER — Observation Stay: Payer: Self-pay

## 2020-05-15 ENCOUNTER — Inpatient Hospital Stay (HOSPITAL_COMMUNITY): Payer: Medicare Other

## 2020-05-15 DIAGNOSIS — R5381 Other malaise: Secondary | ICD-10-CM | POA: Diagnosis present

## 2020-05-15 DIAGNOSIS — R7309 Other abnormal glucose: Secondary | ICD-10-CM | POA: Diagnosis not present

## 2020-05-15 DIAGNOSIS — K658 Other peritonitis: Secondary | ICD-10-CM | POA: Diagnosis not present

## 2020-05-15 DIAGNOSIS — K922 Gastrointestinal hemorrhage, unspecified: Secondary | ICD-10-CM | POA: Diagnosis not present

## 2020-05-15 DIAGNOSIS — Z79899 Other long term (current) drug therapy: Secondary | ICD-10-CM | POA: Diagnosis not present

## 2020-05-15 DIAGNOSIS — Z9049 Acquired absence of other specified parts of digestive tract: Secondary | ICD-10-CM | POA: Diagnosis not present

## 2020-05-15 DIAGNOSIS — I509 Heart failure, unspecified: Secondary | ICD-10-CM | POA: Diagnosis not present

## 2020-05-15 DIAGNOSIS — Z86718 Personal history of other venous thrombosis and embolism: Secondary | ICD-10-CM | POA: Diagnosis not present

## 2020-05-15 DIAGNOSIS — I480 Paroxysmal atrial fibrillation: Secondary | ICD-10-CM | POA: Diagnosis present

## 2020-05-15 DIAGNOSIS — E1165 Type 2 diabetes mellitus with hyperglycemia: Secondary | ICD-10-CM | POA: Diagnosis present

## 2020-05-15 DIAGNOSIS — I130 Hypertensive heart and chronic kidney disease with heart failure and stage 1 through stage 4 chronic kidney disease, or unspecified chronic kidney disease: Secondary | ICD-10-CM | POA: Diagnosis present

## 2020-05-15 DIAGNOSIS — Z6841 Body Mass Index (BMI) 40.0 and over, adult: Secondary | ICD-10-CM | POA: Diagnosis not present

## 2020-05-15 DIAGNOSIS — G8929 Other chronic pain: Secondary | ICD-10-CM | POA: Diagnosis present

## 2020-05-15 DIAGNOSIS — Z7901 Long term (current) use of anticoagulants: Secondary | ICD-10-CM | POA: Diagnosis not present

## 2020-05-15 DIAGNOSIS — I110 Hypertensive heart disease with heart failure: Secondary | ICD-10-CM | POA: Diagnosis present

## 2020-05-15 DIAGNOSIS — H409 Unspecified glaucoma: Secondary | ICD-10-CM | POA: Diagnosis present

## 2020-05-15 DIAGNOSIS — A4181 Sepsis due to Enterococcus: Secondary | ICD-10-CM | POA: Diagnosis present

## 2020-05-15 DIAGNOSIS — R918 Other nonspecific abnormal finding of lung field: Secondary | ICD-10-CM | POA: Diagnosis not present

## 2020-05-15 DIAGNOSIS — Z20822 Contact with and (suspected) exposure to covid-19: Secondary | ICD-10-CM | POA: Diagnosis present

## 2020-05-15 DIAGNOSIS — R739 Hyperglycemia, unspecified: Secondary | ICD-10-CM | POA: Diagnosis not present

## 2020-05-15 DIAGNOSIS — M1990 Unspecified osteoarthritis, unspecified site: Secondary | ICD-10-CM | POA: Diagnosis present

## 2020-05-15 DIAGNOSIS — R6520 Severe sepsis without septic shock: Secondary | ICD-10-CM

## 2020-05-15 DIAGNOSIS — G4700 Insomnia, unspecified: Secondary | ICD-10-CM | POA: Diagnosis present

## 2020-05-15 DIAGNOSIS — E119 Type 2 diabetes mellitus without complications: Secondary | ICD-10-CM | POA: Diagnosis not present

## 2020-05-15 DIAGNOSIS — A419 Sepsis, unspecified organism: Secondary | ICD-10-CM | POA: Diagnosis not present

## 2020-05-15 DIAGNOSIS — N179 Acute kidney failure, unspecified: Secondary | ICD-10-CM | POA: Diagnosis not present

## 2020-05-15 DIAGNOSIS — I5032 Chronic diastolic (congestive) heart failure: Secondary | ICD-10-CM | POA: Diagnosis present

## 2020-05-15 DIAGNOSIS — N3281 Overactive bladder: Secondary | ICD-10-CM | POA: Diagnosis present

## 2020-05-15 DIAGNOSIS — K5721 Diverticulitis of large intestine with perforation and abscess with bleeding: Secondary | ICD-10-CM | POA: Diagnosis present

## 2020-05-15 DIAGNOSIS — Z933 Colostomy status: Secondary | ICD-10-CM | POA: Diagnosis not present

## 2020-05-15 DIAGNOSIS — E1142 Type 2 diabetes mellitus with diabetic polyneuropathy: Secondary | ICD-10-CM | POA: Diagnosis present

## 2020-05-15 DIAGNOSIS — I517 Cardiomegaly: Secondary | ICD-10-CM | POA: Diagnosis not present

## 2020-05-15 DIAGNOSIS — E8809 Other disorders of plasma-protein metabolism, not elsewhere classified: Secondary | ICD-10-CM | POA: Diagnosis not present

## 2020-05-15 DIAGNOSIS — I25118 Atherosclerotic heart disease of native coronary artery with other forms of angina pectoris: Secondary | ICD-10-CM | POA: Diagnosis present

## 2020-05-15 DIAGNOSIS — E2749 Other adrenocortical insufficiency: Secondary | ICD-10-CM | POA: Diagnosis present

## 2020-05-15 DIAGNOSIS — F419 Anxiety disorder, unspecified: Secondary | ICD-10-CM | POA: Diagnosis present

## 2020-05-15 DIAGNOSIS — Z981 Arthrodesis status: Secondary | ICD-10-CM | POA: Diagnosis not present

## 2020-05-15 DIAGNOSIS — I4891 Unspecified atrial fibrillation: Secondary | ICD-10-CM | POA: Diagnosis not present

## 2020-05-15 DIAGNOSIS — Z9981 Dependence on supplemental oxygen: Secondary | ICD-10-CM | POA: Diagnosis not present

## 2020-05-15 DIAGNOSIS — R6521 Severe sepsis with septic shock: Secondary | ICD-10-CM | POA: Diagnosis present

## 2020-05-15 DIAGNOSIS — K5730 Diverticulosis of large intestine without perforation or abscess without bleeding: Secondary | ICD-10-CM | POA: Diagnosis not present

## 2020-05-15 DIAGNOSIS — K219 Gastro-esophageal reflux disease without esophagitis: Secondary | ICD-10-CM | POA: Diagnosis present

## 2020-05-15 DIAGNOSIS — R571 Hypovolemic shock: Secondary | ICD-10-CM | POA: Diagnosis present

## 2020-05-15 DIAGNOSIS — J449 Chronic obstructive pulmonary disease, unspecified: Secondary | ICD-10-CM | POA: Diagnosis present

## 2020-05-15 DIAGNOSIS — Z8719 Personal history of other diseases of the digestive system: Secondary | ICD-10-CM | POA: Diagnosis not present

## 2020-05-15 DIAGNOSIS — I482 Chronic atrial fibrillation, unspecified: Secondary | ICD-10-CM | POA: Diagnosis present

## 2020-05-15 DIAGNOSIS — D649 Anemia, unspecified: Secondary | ICD-10-CM | POA: Diagnosis not present

## 2020-05-15 DIAGNOSIS — I2510 Atherosclerotic heart disease of native coronary artery without angina pectoris: Secondary | ICD-10-CM | POA: Diagnosis present

## 2020-05-15 DIAGNOSIS — K5792 Diverticulitis of intestine, part unspecified, without perforation or abscess without bleeding: Secondary | ICD-10-CM | POA: Diagnosis present

## 2020-05-15 DIAGNOSIS — F32A Depression, unspecified: Secondary | ICD-10-CM | POA: Diagnosis present

## 2020-05-15 DIAGNOSIS — E2740 Unspecified adrenocortical insufficiency: Secondary | ICD-10-CM | POA: Diagnosis present

## 2020-05-15 DIAGNOSIS — Z794 Long term (current) use of insulin: Secondary | ICD-10-CM | POA: Diagnosis not present

## 2020-05-15 DIAGNOSIS — E785 Hyperlipidemia, unspecified: Secondary | ICD-10-CM | POA: Diagnosis present

## 2020-05-15 DIAGNOSIS — A409 Streptococcal sepsis, unspecified: Secondary | ICD-10-CM | POA: Diagnosis not present

## 2020-05-15 DIAGNOSIS — T380X5A Adverse effect of glucocorticoids and synthetic analogues, initial encounter: Secondary | ICD-10-CM | POA: Diagnosis not present

## 2020-05-15 DIAGNOSIS — K449 Diaphragmatic hernia without obstruction or gangrene: Secondary | ICD-10-CM | POA: Diagnosis present

## 2020-05-15 DIAGNOSIS — K5720 Diverticulitis of large intestine with perforation and abscess without bleeding: Secondary | ICD-10-CM | POA: Diagnosis not present

## 2020-05-15 DIAGNOSIS — E039 Hypothyroidism, unspecified: Secondary | ICD-10-CM | POA: Diagnosis present

## 2020-05-15 DIAGNOSIS — D62 Acute posthemorrhagic anemia: Secondary | ICD-10-CM | POA: Diagnosis not present

## 2020-05-15 DIAGNOSIS — Z66 Do not resuscitate: Secondary | ICD-10-CM | POA: Diagnosis not present

## 2020-05-15 DIAGNOSIS — E46 Unspecified protein-calorie malnutrition: Secondary | ICD-10-CM | POA: Diagnosis present

## 2020-05-15 DIAGNOSIS — M549 Dorsalgia, unspecified: Secondary | ICD-10-CM | POA: Diagnosis present

## 2020-05-15 DIAGNOSIS — Z87891 Personal history of nicotine dependence: Secondary | ICD-10-CM | POA: Diagnosis not present

## 2020-05-15 DIAGNOSIS — J9811 Atelectasis: Secondary | ICD-10-CM | POA: Diagnosis not present

## 2020-05-15 LAB — BASIC METABOLIC PANEL
Anion gap: 11 (ref 5–15)
Anion gap: 11 (ref 5–15)
Anion gap: 11 (ref 5–15)
BUN: 22 mg/dL (ref 8–23)
BUN: 26 mg/dL — ABNORMAL HIGH (ref 8–23)
BUN: 27 mg/dL — ABNORMAL HIGH (ref 8–23)
CO2: 28 mmol/L (ref 22–32)
CO2: 27 mmol/L (ref 22–32)
CO2: 25 mmol/L (ref 22–32)
Calcium: 7.7 mg/dL — ABNORMAL LOW (ref 8.9–10.3)
Calcium: 7.5 mg/dL — ABNORMAL LOW (ref 8.9–10.3)
Calcium: 7.4 mg/dL — ABNORMAL LOW (ref 8.9–10.3)
Chloride: 97 mmol/L — ABNORMAL LOW (ref 98–111)
Chloride: 99 mmol/L (ref 98–111)
Chloride: 101 mmol/L (ref 98–111)
Creatinine, Ser: 1.34 mg/dL — ABNORMAL HIGH (ref 0.44–1.00)
Creatinine, Ser: 1.46 mg/dL — ABNORMAL HIGH (ref 0.44–1.00)
Creatinine, Ser: 1.31 mg/dL — ABNORMAL HIGH (ref 0.44–1.00)
GFR, Estimated: 42 mL/min — ABNORMAL LOW (ref >60)
GFR, Estimated: 38 mL/min — ABNORMAL LOW (ref >60)
GFR, Estimated: 43 mL/min — ABNORMAL LOW (ref >60)
Glucose, Bld: 125 mg/dL — ABNORMAL HIGH (ref 70–99)
Glucose, Bld: 111 mg/dL — ABNORMAL HIGH (ref 70–99)
Glucose, Bld: 143 mg/dL — ABNORMAL HIGH (ref 70–99)
Potassium: 3.3 mmol/L — ABNORMAL LOW (ref 3.5–5.1)
Potassium: 3.6 mmol/L (ref 3.5–5.1)
Potassium: 4 mmol/L (ref 3.5–5.1)
Sodium: 136 mmol/L (ref 135–145)
Sodium: 137 mmol/L (ref 135–145)
Sodium: 137 mmol/L (ref 135–145)

## 2020-05-15 LAB — CBC
HCT: 31.3 % — ABNORMAL LOW (ref 36.0–46.0)
HCT: 31.3 % — ABNORMAL LOW (ref 36.0–46.0)
HCT: 30.2 % — ABNORMAL LOW (ref 36.0–46.0)
Hemoglobin: 9.3 g/dL — ABNORMAL LOW (ref 12.0–15.0)
Hemoglobin: 9.1 g/dL — ABNORMAL LOW (ref 12.0–15.0)
Hemoglobin: 8.9 g/dL — ABNORMAL LOW (ref 12.0–15.0)
MCH: 24.7 pg — ABNORMAL LOW (ref 26.0–34.0)
MCH: 24.4 pg — ABNORMAL LOW (ref 26.0–34.0)
MCH: 24.7 pg — ABNORMAL LOW (ref 26.0–34.0)
MCHC: 29.7 g/dL — ABNORMAL LOW (ref 30.0–36.0)
MCHC: 29.1 g/dL — ABNORMAL LOW (ref 30.0–36.0)
MCHC: 29.5 g/dL — ABNORMAL LOW (ref 30.0–36.0)
MCV: 83 fL (ref 80.0–100.0)
MCV: 83.9 fL (ref 80.0–100.0)
MCV: 83.9 fL (ref 80.0–100.0)
Platelets: 255 10*3/uL (ref 150–400)
Platelets: 229 10*3/uL (ref 150–400)
Platelets: 201 10*3/uL (ref 150–400)
RBC: 3.77 MIL/uL — ABNORMAL LOW (ref 3.87–5.11)
RBC: 3.73 MIL/uL — ABNORMAL LOW (ref 3.87–5.11)
RBC: 3.6 MIL/uL — ABNORMAL LOW (ref 3.87–5.11)
RDW: 17.7 % — ABNORMAL HIGH (ref 11.5–15.5)
RDW: 18.1 % — ABNORMAL HIGH (ref 11.5–15.5)
RDW: 18.1 % — ABNORMAL HIGH (ref 11.5–15.5)
WBC: 19.5 10*3/uL — ABNORMAL HIGH (ref 4.0–10.5)
WBC: 19.2 10*3/uL — ABNORMAL HIGH (ref 4.0–10.5)
WBC: 18.4 10*3/uL — ABNORMAL HIGH (ref 4.0–10.5)
nRBC: 0.1 % (ref 0.0–0.2)
nRBC: 0 % (ref 0.0–0.2)
nRBC: 0 % (ref 0.0–0.2)

## 2020-05-15 LAB — GLUCOSE, CAPILLARY
Glucose-Capillary: 107 mg/dL — ABNORMAL HIGH (ref 70–99)
Glucose-Capillary: 145 mg/dL — ABNORMAL HIGH (ref 70–99)
Glucose-Capillary: 113 mg/dL — ABNORMAL HIGH (ref 70–99)
Glucose-Capillary: 127 mg/dL — ABNORMAL HIGH (ref 70–99)

## 2020-05-15 LAB — CORTISOL-AM, BLOOD: Cortisol - AM: 19.7 ug/dL (ref 6.7–22.6)

## 2020-05-15 LAB — MRSA PCR SCREENING: MRSA by PCR: NEGATIVE

## 2020-05-15 LAB — LACTIC ACID, PLASMA
Lactic Acid, Venous: 2.5 mmol/L (ref 0.5–1.9)
Lactic Acid, Venous: 1.2 mmol/L (ref 0.5–1.9)

## 2020-05-15 LAB — VITAMIN B12: Vitamin B-12: 2550 pg/mL — ABNORMAL HIGH (ref 180–914)

## 2020-05-15 LAB — PREPARE RBC (CROSSMATCH)

## 2020-05-15 LAB — MAGNESIUM: Magnesium: 1.4 mg/dL — ABNORMAL LOW (ref 1.7–2.4)

## 2020-05-15 LAB — FOLATE: Folate: 8.3 ng/mL (ref >5.9)

## 2020-05-15 MED ORDER — PIPERACILLIN-TAZOBACTAM 3.375 G IVPB
3.3750 g | Freq: Three times a day (TID) | INTRAVENOUS | Status: AC
  Administered 2020-05-15 – 2020-05-20 (×15): 3.375 g via INTRAVENOUS
  Filled 2020-05-15 (×16): qty 50

## 2020-05-15 MED ORDER — LACTATED RINGERS IV BOLUS
500.0000 mL | Freq: Once | INTRAVENOUS | Status: AC
  Administered 2020-05-15: 02:00:00 500 mL via INTRAVENOUS

## 2020-05-15 MED ORDER — SODIUM CHLORIDE 0.9 % IV BOLUS
250.0000 mL | Freq: Once | INTRAVENOUS | Status: AC
  Administered 2020-05-15: 16:00:00 250 mL via INTRAVENOUS

## 2020-05-15 MED ORDER — CHLORHEXIDINE GLUCONATE CLOTH 2 % EX PADS: 6.0000 | MEDICATED_PAD | Freq: Once | CUTANEOUS | Status: AC

## 2020-05-15 MED ORDER — CHLORHEXIDINE GLUCONATE CLOTH 2 % EX PADS
6.0000 | MEDICATED_PAD | Freq: Every day | CUTANEOUS | Status: DC
  Administered 2020-05-15 – 2020-05-25 (×9): 6 via TOPICAL

## 2020-05-15 MED ORDER — POTASSIUM CHLORIDE CRYS ER 20 MEQ PO TBCR
40.0000 meq | EXTENDED_RELEASE_TABLET | Freq: Four times a day (QID) | ORAL | Status: AC
  Administered 2020-05-15 (×2): 40 meq via ORAL
  Filled 2020-05-15 (×2): qty 2

## 2020-05-15 MED ORDER — CHLORHEXIDINE GLUCONATE CLOTH 2 % EX PADS
6.0000 | MEDICATED_PAD | Freq: Once | CUTANEOUS | Status: AC
  Administered 2020-05-15: 21:00:00 6 via TOPICAL

## 2020-05-15 MED ORDER — SODIUM CHLORIDE 0.9 % IV SOLN
2.0000 g | INTRAVENOUS | Status: AC
  Administered 2020-05-16: 08:00:00 2 g via INTRAVENOUS
  Filled 2020-05-15 (×2): qty 2

## 2020-05-15 MED ORDER — HYDROCORTISONE NA SUCCINATE PF 100 MG IJ SOLR
50.0000 mg | Freq: Three times a day (TID) | INTRAMUSCULAR | Status: DC
  Administered 2020-05-15 – 2020-05-18 (×10): 50 mg via INTRAVENOUS
  Filled 2020-05-15 (×10): qty 2

## 2020-05-15 MED ORDER — SODIUM CHLORIDE 0.9 % IV BOLUS
500.0000 mL | INTRAVENOUS | Status: DC | PRN
  Administered 2020-05-15: 09:00:00 500 mL via INTRAVENOUS

## 2020-05-15 MED ORDER — SODIUM CHLORIDE 0.9% FLUSH
10.0000 mL | Freq: Two times a day (BID) | INTRAVENOUS | Status: DC
  Administered 2020-05-15 – 2020-05-24 (×19): 10 mL

## 2020-05-15 MED ORDER — SODIUM CHLORIDE 0.9% FLUSH
10.0000 mL | INTRAVENOUS | Status: DC | PRN
  Administered 2020-05-19: 22:00:00 10 mL

## 2020-05-15 NOTE — Consult Note (Addendum)
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 6.9 x 4.5 cm. No evidence of abscess at this time. No bowel obstruction or ileus. Vascular/Lymphatic: Aortic atherosclerosis. No enlarged abdominal or pelvic lymph nodes. Reproductive: Status post hysterectomy. No adnexal masses. Other: No free fluid or free intraperitoneal gas. No abdominal wall hernia. Musculoskeletal: No acute or destructive bony lesions. Significant multilevel lumbar spondylosis greatest at L2-3 and L3-4. Reconstructed images demonstrate no additional findings. IMPRESSION: 1. Sigmoid diverticulitis, with contained perforation measuring 6.9 x 4.5 cm as above. No evidence  of abscess at this time. 2.  Aortic Atherosclerosis (ICD10-I70.0). Critical Value/emergent results were called by telephone at the time of interpretation on  05/14/2020 at 7:39 pm to provider NP [facility] , who verbally acknowledged these results. Electronically Signed   By: Randa Ngo M.D.   On: 05/14/2020 19:43   DG CHEST PORT 1 VIEW  Result Date: 05/15/2020 CLINICAL DATA:  PICC line placement EXAM: PORTABLE CHEST 1 VIEW COMPARISON:  Portable exam 1145 hours compared 01/23/2020 FINDINGS: LEFT arm PICC line tip projects over SVC. Enlargement of cardiac silhouette. Atherosclerotic calcification aorta. Pulmonary vascularity normal. Mediastinum appears prominent but this may be related to AP technique. Subsegmental atelectasis at mid LEFT lung and minimally RIGHT base. No infiltrate, pleural effusion or pneumothorax. Bones demineralized with BILATERAL glenohumeral degenerative changes and prior cervical fusion. IMPRESSION: Tip of LEFT arm PICC line projects over SVC. Enlargement of cardiac silhouette. Mild scattered atelectasis. Electronically Signed   By: Lavonia Dana M.D.   On: 05/15/2020 11:56   [place] EKG SITE RITE  Result Date: 05/15/2020 If Site Rite image not attached, placement could not be confirmed due to current cardiac rhythm.    Assessment & Plan:  Ann [name] is a 74 y.o. female with perforation and contained stool and free air in the sigmoid colon likely from diverticulitis. She is having bleeding from this too and is requiring blood.  Discussed with patient and her husband and given the extent of the perforation, bleeding, need for steroids, and overall health I am not sure she will be able to keep this sealed/ healed this. IR is unlikely to be an option due to the location and risk of colonic fistula with a drain. She took her eliquis 12/19.  She is currently improved in the ICU after resuscitation. She is having pain but is not peritoneal.   Discussed with her and her husband who is her POA code status and CPR. She would not want resuscitation and would not want to be on longterm breathing tube or "on machines." She understands  that if she had surgery she would have a temporary breathing tube and the DNR is usually held during the surgery time. RN Caryl Pina was in the room and confirms this is patient's and husband's request.   Discussed colostomy formation and risk of bleeding, infection, need for open wound, possibility of finding a cancer.   Pending on how she is doing tomorrow, will schedule for surgery for colostomy.   PICC line today, resuscitation, CXR and EKG preop  Hypotension likely multi factorial with hypovolemia from bleeding, sepsis from perforation and adrenal insufficiency, Dr. Laurena Bering has started steroids  Agree with Zosyn for Diverticulitis with perforation  COVID negative  2 U pRBC for tomorrow OR Eliquis out of system X 48 hours tomorrow for OR   All questions were answered to the satisfaction of the patient and family. Discussed with GI and Dr. Sloan Leiter.    Virl Cagey 05/15/2020, 12:23 PM

## 2020-05-15 NOTE — Progress Notes (Signed)
PROGRESS NOTE        PATIENT DETAILS Name: Ann [name] Age: 74 y.o. Sex: female Date of Birth: February 28, 1946 Admit Date: 05/14/2020 Admitting Physician Courage Denton Brick, MD VHQ:IONGE, Brandon Melnick, MD  Brief Narrative: Patient is a 74 y.o. female chronic diastolic heart failure, chronic atrial fibrillation, adrenal insufficiency, DM-2, COPD, CAD-s/p PCI-presented to the ED with 1 day history of left lower quadrant abdominal pain and hematochezia.  Further evaluation revealed sigmoid diverticulitis with perforation.  Hospital course complicated by acute blood loss anemia and hypotension.  See below for further details.  Significant events: 12/19>> admit for sigmoid diverticulitis with perforation-hematochezia with acute blood loss anemia 12/20>> multiple episodes of hematochezia overnight-hypotension-transfer to ICU  Significant studies: 12/19>> CT abdomen/pelvis: Sigmoid diverticulitis with contained perforation.  Antimicrobial therapy: Zosyn: 12/19>>  Microbiology data: None  Procedures : None  Consults: GI, general surgery  DVT Prophylaxis : SCDs Start: 05/14/20 1348 Place TED hose Start: 05/14/20 1348   Subjective: Continues to have LLQ pain-multiple episodes of hematochezia overnight.  Hypotensive.   Assessment/Plan: Hypotension: Multifactorial-secondary to acute blood loss, sepsis-and relative adrenal insufficiency.  Plan is to bolus IV fluids-continue antibiotics-and initiate stress dose steroids.  Place PICC line in case patient requires pressors-transfer to ICU and monitor closely.  Follow CBC.  Stop all antihypertensives for now.  Sepsis due to sigmoid diverticulitis with contained perforation: See above regarding hypotension-continue Zosyn-obtain cultures.  Discussed with general surgery-monitor closely-if deteriorates-surgery to perform colectomy/colostomy.  Lower GI bleeding/Hematochezia with acute blood loss anemia: Unusual for  diverticular bleeding in the setting of diverticulitis-May have underlying malignancy.  Will need endoscopic evaluation at some point when more stable.  Plans are to monitor closely-follow CBC and transfuse as needed.  Required 2 units of PRBC overnight.  Discussed with both GI/general surgery-if hemodynamically unstable-plan is to pursue colectomy/colostomy.  Chronic atrial fibrillation: Currently rate controlled-holding all rate control agents due to hypotension.  Eliquis obviously on hold given active blood loss due to GI bleeding-last dose on 12/19 morning.  Follow closely.  Chronic diastolic heart failure: Volume status stable-hold Lasix and other therapies given hypotension.  CAD-history of prior PCI: No anginal symptoms-not on aspirin as patient on anticoagulation.  COPD: Stable-continue bronchodilators.  AKI: Mild-likely hemodynamically mediated-with some element of contrast-induced nephropathy (received contrast for CT abdomen on admission).  Avoid nephrotoxic agents-follow renal function closely-if worsens-we will consult nephrology.    Relative adrenal insufficiency: On 10 mg of prednisone daily-not started on admission-hypotensive this morning-starting stress dose hydrocortisone.  Follow closely.  History of vitamin B12 deficiency: Continue supplementation  Hypothyroidism (TSH 1.22 on 8/12): On Synthroid  DM-2 (A1c 7.1 on 11/16): CBGs relatively stable-on Levemir 30 units daily and SSI.  Has been started on steroids-is n.p.o. due to above-noted issues-may have to adjust insulin regimen-follow closely.  Recent Labs    05/14/20 1954 05/14/20 2258 05/15/20 0809  GLUCAP 139* 176* 107*    Morbid Obesity: Estimated body mass index is 49.71 kg/m as calculated from the following:   Height as of this encounter: 5\' 3"  (1.6 m).   Weight as of this encounter: 127.3 kg.    Diet: Diet Order            Diet NPO time specified  Diet effective midnight                  Code  Status:  Full code  Family Communication: Spouse [name] [phone])-left a voicemail on 12/20 at 9:52 AM  Disposition Plan: Status is: Observation  The patient will require care spanning > 2 midnights and should be moved to inpatient because: Hemodynamically unstable  Dispo: The patient is from: Home              Anticipated d/c is to: Home              Anticipated d/c date is: > 3 days              Patient currently is not medically stable to d/c.  Barriers to Discharge: Hypotension/sepsis/acute blood loss anemia-transferring to ICU for close monitoring and possible pressor support.  Antimicrobial agents: Anti-infectives (From admission, onward)   Start     Dose/Rate Route Frequency Ordered Stop   05/15/20 1000  piperacillin-tazobactam (ZOSYN) IVPB 3.375 g        3.375 g 12.5 mL/hr over 240 Minutes Intravenous Every 8 hours 05/15/20 0000     05/15/20 0100  piperacillin-tazobactam (ZOSYN) IVPB 3.375 g        3.375 g 12.5 mL/hr over 240 Minutes Intravenous  Once 05/14/20 2357 05/15/20 0444       Time spent: 45 minutes-Greater than 50% of this time was spent in counseling, explanation of diagnosis, planning of further management, and coordination of care.  MEDICATIONS: Scheduled Meds: . atorvastatin  80 mg Oral q1800  . Chlorhexidine Gluconate Cloth  6 each Topical Daily  . cholecalciferol  5,000 Units Oral Daily  . darifenacin  7.5 mg Oral Daily  . gabapentin  400 mg Oral TID  . hydrocortisone sod succinate (SOLU-CORTEF) inj  50 mg Intravenous Q8H  . insulin aspart  0-15 Units Subcutaneous TID WC  . insulin aspart  0-5 Units Subcutaneous QHS  . insulin detemir  30 Units Subcutaneous Q2200  . levothyroxine  150 mcg Oral QAC breakfast  . mirabegron ER  50 mg Oral Daily  . pantoprazole (PROTONIX) IV  40 mg Intravenous Q12H  . polyethylene glycol  17 g Oral BID  . potassium chloride  40 mEq Oral Q6H  . sertraline  50 mg Oral QHS  . sodium chloride flush  3 mL Intravenous  Q12H  . sodium chloride flush  3 mL Intravenous Q12H  . umeclidinium bromide  1 puff Inhalation Daily  . cyanocobalamin  1,000 mcg Oral Daily   Continuous Infusions: . sodium chloride    . sodium chloride 100 mL/hr at 05/15/20 0032  . piperacillin-tazobactam (ZOSYN)  IV    . sodium chloride 500 mL/hr at 05/15/20 0922   PRN Meds:.sodium chloride, acetaminophen **OR** acetaminophen, albuterol, bisacodyl, morphine injection, nitroGLYCERIN, ondansetron **OR** ondansetron (ZOFRAN) IV, polyvinyl alcohol, sodium chloride, sodium chloride flush, traZODone   PHYSICAL EXAM: Vital signs: Vitals:   05/15/20 0803 05/15/20 0806 05/15/20 0900 05/15/20 0902  BP:   (!) 109/47   Pulse:   90   Resp:   (!) 31   Temp:    99 F (37.2 C)  TempSrc:    Oral  SpO2: 91% 91% 96%   Weight:  127.3 kg    Height:   5\' 3"  (1.6 m)    Filed Weights   05/14/20 1143 05/14/20 1620 05/15/20 0806  Weight: 119.3 kg 123.2 kg 127.3 kg   Body mass index is 49.71 kg/m.   Gen Exam:Alert awake-not in any distress HEENT:atraumatic, normocephalic Chest: B/L clear to auscultation anteriorly CVS:S1S2 regular Abdomen: Soft-tenderness in the left  lower quadrant area-mild guarding-mild rebound tenderness. Extremities:no edema Neurology: Non focal Skin: no rash  I have personally reviewed following labs and imaging studies  LABORATORY DATA: CBC: Recent Labs  Lab 05/14/20 1201 05/15/20 0345 05/15/20 0859  WBC 16.9* 19.5* 19.2*  HGB 8.5* 9.3* 9.1*  HCT 29.8* 31.3* 31.3*  MCV 83.9 83.0 83.9  PLT 343 255 818    Basic Metabolic Panel: Recent Labs  Lab 05/14/20 1201 05/15/20 0345 05/15/20 0859  NA 139 136 137  K 3.9 3.3* 3.6  CL 99 97* 99  CO2 30 28 27   GLUCOSE 128* 125* 111*  BUN 18 22 26*  CREATININE 1.07* 1.34* 1.46*  CALCIUM 8.6* 7.7* 7.5*    GFR: Estimated Creatinine Clearance: 44 mL/min (A) (by C-G formula based on SCr of 1.46 mg/dL (H)).  Liver Function Tests: Recent Labs  Lab  05/14/20 1201  AST 20  ALT 14  ALKPHOS 54  BILITOT 0.5  PROT 7.0  ALBUMIN 3.7   No results for input(s): LIPASE, AMYLASE in the last 168 hours. No results for input(s): AMMONIA in the last 168 hours.  Coagulation Profile: Recent Labs  Lab 05/14/20 1246  INR 1.4*    Cardiac Enzymes: No results for input(s): CKTOTAL, CKMB, CKMBINDEX, TROPONINI in the last 168 hours.  BNP (last 3 results) No results for input(s): PROBNP in the last 8760 hours.  Lipid Profile: No results for input(s): CHOL, HDL, LDLCALC, TRIG, CHOLHDL, LDLDIRECT in the last 72 hours.  Thyroid Function Tests: No results for input(s): TSH, T4TOTAL, FREET4, T3FREE, THYROIDAB in the last 72 hours.  Anemia Panel: Recent Labs    05/15/20 0345  VITAMINB12 2,550*  FOLATE 8.3    Urine analysis:    Component Value Date/Time   COLORURINE YELLOW 01/23/2020 1819   APPEARANCEUR CLEAR 01/23/2020 1819   LABSPEC 1.012 01/23/2020 1819   PHURINE 5.0 01/23/2020 1819   GLUCOSEU NEGATIVE 01/23/2020 1819   HGBUR NEGATIVE 01/23/2020 1819   BILIRUBINUR NEGATIVE 01/23/2020 1819   KETONESUR NEGATIVE 01/23/2020 1819   PROTEINUR NEGATIVE 01/23/2020 1819   UROBILINOGEN 0.2 03/15/2013 1504   NITRITE NEGATIVE 01/23/2020 1819   LEUKOCYTESUR NEGATIVE 01/23/2020 1819    Sepsis Labs: Lactic Acid, Venous    Component Value Date/Time   LATICACIDVEN 1.2 05/15/2020 0345    MICROBIOLOGY: Recent Results (from the past 240 hour(s))  Resp Panel by RT-PCR (Flu A&B, Covid) Nasopharyngeal Swab     Status: None   Collection Time: 05/14/20 12:05 PM   Specimen: Nasopharyngeal Swab; Nasopharyngeal(NP) swabs in vial transport medium  Result Value Ref Range Status   SARS Coronavirus 2 by RT PCR NEGATIVE NEGATIVE Final    Comment: (NOTE) SARS-CoV-2 target nucleic acids are NOT DETECTED.  The SARS-CoV-2 RNA is generally detectable in upper respiratory specimens during the acute phase of infection. The lowest concentration of  SARS-CoV-2 viral copies this assay can detect is 138 copies/mL. A negative result does not preclude SARS-Cov-2 infection and should not be used as the sole basis for treatment or other patient management decisions. A negative result may occur with  improper specimen collection/handling, submission of specimen other than nasopharyngeal swab, presence of viral mutation(s) within the areas targeted by this assay, and inadequate number of viral copies(<138 copies/mL). A negative result must be combined with clinical observations, patient history, and epidemiological information. The expected result is Negative.  Fact Sheet for Patients:  EntrepreneurPulse.com.au  Fact Sheet for Healthcare Providers:  IncredibleEmployment.be  This test is no t yet approved or cleared  by the [place] and  has been authorized for detection and/or diagnosis of SARS-CoV-2 by FDA under an Emergency Use Authorization (EUA). This EUA will remain  in effect (meaning this test can be used) for the duration of the COVID-19 declaration under Section 564(b)(1) of the Act, 21 U.S.C.section 360bbb-3(b)(1), unless the authorization is terminated  or revoked sooner.       Influenza A by PCR NEGATIVE NEGATIVE Final   Influenza B by PCR NEGATIVE NEGATIVE Final    Comment: (NOTE) The Xpert Xpress SARS-CoV-2/FLU/RSV plus assay is intended as an aid in the diagnosis of influenza from Nasopharyngeal swab specimens and should not be used as a sole basis for treatment. Nasal washings and aspirates are unacceptable for Xpert Xpress SARS-CoV-2/FLU/RSV testing.  Fact Sheet for Patients: EntrepreneurPulse.com.au  Fact Sheet for Healthcare Providers: IncredibleEmployment.be  This test is not yet approved or cleared by the [place] FDA and has been authorized for detection and/or diagnosis of SARS-CoV-2 by FDA under an Emergency Use  Authorization (EUA). This EUA will remain in effect (meaning this test can be used) for the duration of the COVID-19 declaration under Section 564(b)(1) of the Act, 21 U.S.C. section 360bbb-3(b)(1), unless the authorization is terminated or revoked.  Performed at [facility], [address] [place], [place] [address]     RADIOLOGY STUDIES/RESULTS: CT ABDOMEN PELVIS W CONTRAST  Result Date: 05/14/2020 CLINICAL DATA:  Lower abdominal pain, rectal bleeding EXAM: CT ABDOMEN AND PELVIS WITH CONTRAST TECHNIQUE: Multidetector CT imaging of the abdomen and pelvis was performed using the standard protocol following bolus administration of intravenous contrast. CONTRAST:  113mL OMNIPAQUE IOHEXOL 300 MG/ML  SOLN COMPARISON:  06/17/2017 FINDINGS: Lower chest: Trace bilateral pleural effusions. No acute airspace disease. Hepatobiliary: No focal liver abnormality is seen. Status post cholecystectomy. No biliary dilatation. Pancreas: Unremarkable. No pancreatic ductal dilatation or surrounding inflammatory changes. Spleen: Normal in size without focal abnormality. Adrenals/Urinary Tract: Adrenal glands are unremarkable. Kidneys are normal, without renal calculi, focal lesion, or hydronephrosis. Bladder is unremarkable. Stomach/Bowel: There is marked inflammation of the mid sigmoid colon, with wall thickening and pericolonic fat stranding. Findings are consistent with sigmoid diverticulitis. Additionally, there is a large contained perforation containing gas and stool on image 66, measuring 6.9 x 4.5 cm. No evidence of abscess at this time. No bowel obstruction or ileus. Vascular/Lymphatic: Aortic atherosclerosis. No enlarged abdominal or pelvic lymph nodes. Reproductive: Status post hysterectomy. No adnexal masses. Other: No free fluid or free intraperitoneal gas. No abdominal wall hernia. Musculoskeletal: No acute or destructive bony lesions. Significant multilevel lumbar spondylosis greatest at L2-3 and L3-4.  Reconstructed images demonstrate no additional findings. IMPRESSION: 1. Sigmoid diverticulitis, with contained perforation measuring 6.9 x 4.5 cm as above. No evidence of abscess at this time. 2.  Aortic Atherosclerosis (ICD10-I70.0). Critical Value/emergent results were called by telephone at the time of interpretation on 05/14/2020 at 7:39 pm to provider NP [facility] , who verbally acknowledged these results. Electronically Signed   By: Randa Ngo M.D.   On: 05/14/2020 19:43   [place] EKG SITE RITE  Result Date: 05/15/2020 If Site Rite image not attached, placement could not be confirmed due to current cardiac rhythm.    LOS: 0 days   Oren Binet, MD  [facility]    To contact the attending provider between 7A-7P or the covering provider during after hours 7P-7A, please log into the web site [email or web address] and access using universal [place] password for that web site. If you  do not have the password, please call the hospital operator.  05/15/2020, 9:27 AM

## 2020-05-15 NOTE — Progress Notes (Deleted)
CCMD has called multiple times because patient isn't staying on the monitor.  Leads have been changed x3 and box has now been changed to 25.

## 2020-05-15 NOTE — H&P (View-Only) (Signed)
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 6.9 x 4.5 cm. No evidence of abscess at this time. No bowel obstruction or ileus. Vascular/Lymphatic: Aortic atherosclerosis. No enlarged abdominal or pelvic lymph nodes. Reproductive: Status post hysterectomy. No adnexal masses. Other: No free fluid or free intraperitoneal gas. No abdominal wall hernia. Musculoskeletal: No acute or destructive bony lesions. Significant multilevel lumbar spondylosis greatest at L2-3 and L3-4. Reconstructed images demonstrate no additional findings. IMPRESSION: 1. Sigmoid diverticulitis, with contained perforation measuring 6.9 x 4.5 cm as above. No evidence  of abscess at this time. 2.  Aortic Atherosclerosis (ICD10-I70.0). Critical Value/emergent results were called by telephone at the time of interpretation on  05/14/2020 at 7:39 pm to provider NP [facility] , who verbally acknowledged these results. Electronically Signed   By: Randa Ngo M.D.   On: 05/14/2020 19:43   DG CHEST PORT 1 VIEW  Result Date: 05/15/2020 CLINICAL DATA:  PICC line placement EXAM: PORTABLE CHEST 1 VIEW COMPARISON:  Portable exam 1145 hours compared 01/23/2020 FINDINGS: LEFT arm PICC line tip projects over SVC. Enlargement of cardiac silhouette. Atherosclerotic calcification aorta. Pulmonary vascularity normal. Mediastinum appears prominent but this may be related to AP technique. Subsegmental atelectasis at mid LEFT lung and minimally RIGHT base. No infiltrate, pleural effusion or pneumothorax. Bones demineralized with BILATERAL glenohumeral degenerative changes and prior cervical fusion. IMPRESSION: Tip of LEFT arm PICC line projects over SVC. Enlargement of cardiac silhouette. Mild scattered atelectasis. Electronically Signed   By: Lavonia Dana M.D.   On: 05/15/2020 11:56   [place] EKG SITE RITE  Result Date: 05/15/2020 If Site Rite image not attached, placement could not be confirmed due to current cardiac rhythm.    Assessment & Plan:  EMILE RINGGENBERG is a 74 y.o. female with perforation and contained stool and free air in the sigmoid colon likely from diverticulitis. She is having bleeding from this too and is requiring blood.  Discussed with patient and her husband and given the extent of the perforation, bleeding, need for steroids, and overall health I am not sure she will be able to keep this sealed/ healed this. IR is unlikely to be an option due to the location and risk of colonic fistula with a drain. She took her eliquis 12/19.  She is currently improved in the ICU after resuscitation. She is having pain but is not peritoneal.   Discussed with her and her husband who is her POA code status and CPR. She would not want resuscitation and would not want to be on longterm breathing tube or "on machines." She understands  that if she had surgery she would have a temporary breathing tube and the DNR is usually held during the surgery time. RN Ann [name] was in the room and confirms this is patient's and husband's request.   Discussed colostomy formation and risk of bleeding, infection, need for open wound, possibility of finding a cancer.   Pending on how she is doing tomorrow, will schedule for surgery for colostomy.   PICC line today, resuscitation, CXR and EKG preop  Hypotension likely multi factorial with hypovolemia from bleeding, sepsis from perforation and adrenal insufficiency, Dr. Laurena Bering has started steroids  Agree with Zosyn for Diverticulitis with perforation  COVID negative  2 U pRBC for tomorrow OR Eliquis out of system X 48 hours tomorrow for OR   All questions were answered to the satisfaction of the patient and family. Discussed with GI and Dr. Sloan Leiter.    Virl Cagey 05/15/2020, 12:23 PM

## 2020-05-15 NOTE — Anesthesia Preprocedure Evaluation (Addendum)
Anesthesia Evaluation  Patient identified by MRN, date of birth, ID band Patient awake    Reviewed: Allergy & Precautions, NPO status , Patient's Chart, lab work & pertinent test results  History of Anesthesia Complications Negative for: history of anesthetic complications  Airway Mallampati: III  TM Distance: >3 FB Neck ROM: Full    Dental  (+) Edentulous Upper, Edentulous Lower   Pulmonary COPD,  oxygen dependent, former smoker,  On 4 L of oxygen,           Cardiovascular hypertension, + angina + CAD, + Cardiac Stents and +CHF  + dysrhythmias Atrial Fibrillation  Rhythm:Irregular  1. Left ventricular ejection fraction, by estimation, is 60 to 65%. The  left ventricle has normal function. The left ventricle has no regional  wall motion abnormalities. There is moderate left ventricular hypertrophy.  Left ventricular diastolic  parameters are indeterminate.  2. Right ventricular systolic function is normal. The right ventricular  size is normal.  3. Left atrial size was mildly dilated.  4. The mitral valve is normal in structure. No evidence of mitral valve  regurgitation. No evidence of mitral stenosis.  5. The aortic valve has an indeterminant number of cusps. Aortic valve  regurgitation is not visualized. No aortic stenosis is present.  6. The inferior vena cava is normal in size with greater than 50%  respiratory variability, suggesting right atrial pressure of 3 mmHg.  14-May-2020 11:44:48 [facility]-AP-ER ROUTINE RECORD Atrial fibrillation Low voltage, extremity and precordial leads Non-specific ST-t changes Confirmed by Lajean Saver [phone]) on 05/14/2020 11:52:08 AM    Neuro/Psych Anxiety  Neuromuscular disease    GI/Hepatic hiatal hernia, PUD, GERD  ,  Endo/Other  diabetes, Well Controlled, Type 2, Insulin DependentHypothyroidism Morbid obesityAdrenal insufficiency   Renal/GU Renal disease      Musculoskeletal  (+) Arthritis ,   Abdominal   Peds  Hematology  (+) anemia ,   Anesthesia Other Findings CXR -05/15/20 Tip of LEFT arm PICC line projects over SVC.  Enlargement of cardiac silhouette.  Mild scattered atelectasis.   Electronically Signed   By: Lavonia Dana M.D.   On: 05/15/2020 11:56  Reproductive/Obstetrics                           Anesthesia Physical Anesthesia Plan  ASA: IV  Anesthesia Plan: General   Post-op Pain Management:    Induction: Intravenous  PONV Risk Score and Plan: Ondansetron  Airway Management Planned: Oral ETT  Additional Equipment: Arterial line  Intra-op Plan:   Post-operative Plan: Extubation in OR and Possible Post-op intubation/ventilation  Informed Consent:    Discussed DNR with patient and Suspend DNR.     Plan Discussed with: Surgeon and CRNA  Anesthesia Plan Comments: (Prolonged QTc in 12 lead ekg, Please  check magnesium levels, continue IV hydration- 05/15/20  Magnesium levels low, Will give magnesium 2 grams ivpb and stress dose steroids)     Anesthesia Quick Evaluation

## 2020-05-15 NOTE — Progress Notes (Signed)
Peripherally Inserted Central Catheter Placement  The IV Nurse has discussed with the patient and/or persons authorized to consent for the patient, the purpose of this procedure and the potential benefits and risks involved with this procedure.  The benefits include less needle sticks, lab draws from the catheter, and the patient may be discharged home with the catheter. Risks include, but not limited to, infection, bleeding, blood clot (thrombus formation), and puncture of an artery; nerve damage and irregular heartbeat and possibility to perform a PICC exchange if needed/ordered by physician.  Alternatives to this procedure were also discussed.  Bard Power PICC patient education guide, fact sheet on infection prevention and patient information card has been provided to patient /or left at bedside.    PICC Placement Documentation  PICC Double Lumen 05/15/20 PICC 46 cm 1 cm (Active)  Indication for Insertion or Continuance of Line Poor Vasculature-patient has had multiple peripheral attempts or PIVs lasting less than 24 hours 05/15/20 1100  Exposed Catheter (cm) 1 cm 05/15/20 1100  Site Assessment Clean;Dry;Intact 05/15/20 1100  Lumen #1 Status Flushed;Blood return noted 05/15/20 1100  Lumen #2 Status Flushed;Blood return noted 05/15/20 1100  Dressing Type Gauze 05/15/20 1100  Dressing Status Dry;Intact 05/15/20 1100  Antimicrobial disc in place? No (comment) 05/15/20 1100  Dressing Change Due 05/22/20 05/15/20 1100       Jule Economy Horton 05/15/2020, 11:49 AM
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
## 2020-05-15 NOTE — Progress Notes (Signed)
Pharmacy Antibiotic Note  Ann [name] is a 74 y.o. female admitted on 05/14/2020 with intra-abdominal infection.  Pharmacy has been consulted for Zosyn dosing. WBC is elevated. Renal function ok.   Plan: Zosyn 3.375G IV q8h to be infused over 4 hours Trend WBC, temp, renal function  F/U infectious work-up   Height: 5\' 3"  (160 cm) Weight: 123.2 kg (271 lb 9.7 oz) IBW/kg (Calculated) : 52.4  Temp (24hrs), Avg:99.5 F (37.5 C), Min:98.1 F (36.7 C), Max:101.9 F (38.8 C)  Recent Labs  Lab 05/14/20 1201  WBC 16.9*  CREATININE 1.07*    Estimated Creatinine Clearance: 58.8 mL/min (A) (by C-G formula based on SCr of 1.07 mg/dL (H)).    Allergies  Allergen Reactions  . Ace Inhibitors Other (See Comments)    unknown  . Asa [Aspirin]     Causes bleeding  . Tape Other (See Comments)    Skin tearing, causes scars  . Niacin Rash  . Reglan [Metoclopramide] Anxiety    Narda Bonds, PharmD, [place] Clinical Pharmacist Phone: [phone]

## 2020-05-15 NOTE — Progress Notes (Signed)
[facility]  Checked on patient. More bloody BMs. Continues to have lower abdominal pain. Remains tachycardic. I think we will have to proceed with colostomy given continued blooding and large size of her perforation on review of the CT with radiology ~ 4cm.   Curlene Labrum, MD [facility] [address] [place], [place] [address] [phone] (office)

## 2020-05-16 ENCOUNTER — Inpatient Hospital Stay (HOSPITAL_COMMUNITY): Payer: Medicare Other | Admitting: Anesthesiology

## 2020-05-16 ENCOUNTER — Encounter (HOSPITAL_COMMUNITY): Payer: Self-pay | Admitting: Internal Medicine

## 2020-05-16 ENCOUNTER — Encounter (HOSPITAL_COMMUNITY)
Admission: EM | Disposition: A | Discharge status: Rehabilitation Facility | Payer: Self-pay | Source: Home / Self Care | Attending: Family Medicine

## 2020-05-16 DIAGNOSIS — K5721 Diverticulitis of large intestine with perforation and abscess with bleeding: Secondary | ICD-10-CM | POA: Diagnosis not present

## 2020-05-16 HISTORY — PX: COLOSTOMY: SHX63

## 2020-05-16 LAB — COMPREHENSIVE METABOLIC PANEL
ALT: 13 U/L (ref 0–44)
AST: 20 U/L (ref 15–41)
Albumin: 3 g/dL — ABNORMAL LOW (ref 3.5–5.0)
Alkaline Phosphatase: 49 U/L (ref 38–126)
Anion gap: 9 (ref 5–15)
BUN: 25 mg/dL — ABNORMAL HIGH (ref 8–23)
CO2: 28 mmol/L (ref 22–32)
Calcium: 7.6 mg/dL — ABNORMAL LOW (ref 8.9–10.3)
Chloride: 103 mmol/L (ref 98–111)
Creatinine, Ser: 1.05 mg/dL — ABNORMAL HIGH (ref 0.44–1.00)
GFR, Estimated: 56 mL/min — ABNORMAL LOW (ref >60)
Glucose, Bld: 138 mg/dL — ABNORMAL HIGH (ref 70–99)
Potassium: 3.6 mmol/L (ref 3.5–5.1)
Sodium: 140 mmol/L (ref 135–145)
Total Bilirubin: 0.6 mg/dL (ref 0.3–1.2)
Total Protein: 6.2 g/dL — ABNORMAL LOW (ref 6.5–8.1)

## 2020-05-16 LAB — CBC
HCT: 31.2 % — ABNORMAL LOW (ref 36.0–46.0)
HCT: 34 % — ABNORMAL LOW (ref 36.0–46.0)
Hemoglobin: 9.1 g/dL — ABNORMAL LOW (ref 12.0–15.0)
Hemoglobin: 9.8 g/dL — ABNORMAL LOW (ref 12.0–15.0)
MCH: 24.6 pg — ABNORMAL LOW (ref 26.0–34.0)
MCH: 24.7 pg — ABNORMAL LOW (ref 26.0–34.0)
MCHC: 29.2 g/dL — ABNORMAL LOW (ref 30.0–36.0)
MCHC: 28.8 g/dL — ABNORMAL LOW (ref 30.0–36.0)
MCV: 84.3 fL (ref 80.0–100.0)
MCV: 85.6 fL (ref 80.0–100.0)
Platelets: 202 10*3/uL (ref 150–400)
Platelets: 227 10*3/uL (ref 150–400)
RBC: 3.7 MIL/uL — ABNORMAL LOW (ref 3.87–5.11)
RBC: 3.97 MIL/uL (ref 3.87–5.11)
RDW: 18.3 % — ABNORMAL HIGH (ref 11.5–15.5)
RDW: 18.4 % — ABNORMAL HIGH (ref 11.5–15.5)
WBC: 18.6 10*3/uL — ABNORMAL HIGH (ref 4.0–10.5)
WBC: 20.4 10*3/uL — ABNORMAL HIGH (ref 4.0–10.5)
nRBC: 0 % (ref 0.0–0.2)
nRBC: 0 % (ref 0.0–0.2)

## 2020-05-16 LAB — GLUCOSE, CAPILLARY
Glucose-Capillary: 138 mg/dL — ABNORMAL HIGH (ref 70–99)
Glucose-Capillary: 151 mg/dL — ABNORMAL HIGH (ref 70–99)
Glucose-Capillary: 134 mg/dL — ABNORMAL HIGH (ref 70–99)
Glucose-Capillary: 133 mg/dL — ABNORMAL HIGH (ref 70–99)
Glucose-Capillary: 137 mg/dL — ABNORMAL HIGH (ref 70–99)

## 2020-05-16 SURGERY — CREATION, COLOSTOMY
Anesthesia: General

## 2020-05-16 MED ORDER — METOPROLOL TARTRATE 5 MG/5ML IV SOLN
INTRAVENOUS | Status: AC
  Filled 2020-05-16: qty 5

## 2020-05-16 MED ORDER — PROPOFOL 10 MG/ML IV BOLUS
INTRAVENOUS | Status: AC
  Filled 2020-05-16: qty 20

## 2020-05-16 MED ORDER — FENTANYL CITRATE (PF) 250 MCG/5ML IJ SOLN
INTRAMUSCULAR | Status: AC
  Filled 2020-05-16: qty 5

## 2020-05-16 MED ORDER — LIDOCAINE 2% (20 MG/ML) 5 ML SYRINGE
INTRAMUSCULAR | Status: DC | PRN
  Administered 2020-05-16: 60 mg via INTRAVENOUS

## 2020-05-16 MED ORDER — BUPIVACAINE LIPOSOME 1.3 % IJ SUSP
INTRAMUSCULAR | Status: AC
  Filled 2020-05-16: qty 20

## 2020-05-16 MED ORDER — SODIUM CHLORIDE 0.9 % IR SOLN
Status: DC | PRN
  Administered 2020-05-16: 1000 mL

## 2020-05-16 MED ORDER — MIDAZOLAM HCL 5 MG/5ML IJ SOLN
INTRAMUSCULAR | Status: DC | PRN
  Administered 2020-05-16: 1 mg via INTRAVENOUS

## 2020-05-16 MED ORDER — FENTANYL CITRATE (PF) 100 MCG/2ML IJ SOLN
INTRAMUSCULAR | Status: DC | PRN
  Administered 2020-05-16 (×2): 50 ug via INTRAVENOUS
  Administered 2020-05-16: 100 ug via INTRAVENOUS
  Administered 2020-05-16: 50 ug via INTRAVENOUS
  Administered 2020-05-16: 100 ug via INTRAVENOUS

## 2020-05-16 MED ORDER — HYDROCORTISONE NA SUCCINATE PF 100 MG IJ SOLR
INTRAMUSCULAR | Status: DC | PRN
  Administered 2020-05-16: 100 mg via INTRAVENOUS

## 2020-05-16 MED ORDER — METOPROLOL TARTRATE 5 MG/5ML IV SOLN
1.0000 mg | Freq: Once | INTRAVENOUS | Status: AC
  Administered 2020-05-16: 11:00:00 1 mg via INTRAVENOUS

## 2020-05-16 MED ORDER — METOPROLOL TARTRATE 5 MG/5ML IV SOLN
5.0000 mg | Freq: Four times a day (QID) | INTRAVENOUS | Status: DC | PRN
  Administered 2020-05-16 – 2020-05-17 (×2): 5 mg via INTRAVENOUS
  Filled 2020-05-16 (×3): qty 5

## 2020-05-16 MED ORDER — OXYCODONE HCL 5 MG PO TABS
5.0000 mg | ORAL_TABLET | ORAL | Status: DC | PRN
Start: 2020-05-16 — End: 2020-05-25
  Administered 2020-05-18 (×2): 10 mg via ORAL
  Administered 2020-05-19: 5 mg via ORAL
  Administered 2020-05-21 – 2020-05-23 (×3): 10 mg via ORAL
  Administered 2020-05-24: 5 mg via ORAL
  Administered 2020-05-24: 10 mg via ORAL
  Administered 2020-05-24 – 2020-05-25 (×2): 5 mg via ORAL
  Filled 2020-05-16 (×2): qty 2
  Filled 2020-05-16: qty 1
  Filled 2020-05-16: qty 2
  Filled 2020-05-16: qty 1
  Filled 2020-05-16 (×4): qty 2
  Filled 2020-05-16 (×2): qty 1

## 2020-05-16 MED ORDER — ONDANSETRON HCL 4 MG/2ML IJ SOLN
INTRAMUSCULAR | Status: AC
  Filled 2020-05-16: qty 2

## 2020-05-16 MED ORDER — PHENYLEPHRINE 40 MCG/ML (10ML) SYRINGE FOR IV PUSH (FOR BLOOD PRESSURE SUPPORT)
PREFILLED_SYRINGE | INTRAVENOUS | Status: DC | PRN
  Administered 2020-05-16 (×2): 80 ug via INTRAVENOUS

## 2020-05-16 MED ORDER — HEPARIN SOD (PORK) LOCK FLUSH 100 UNIT/ML IV SOLN
250.0000 [IU] | INTRAVENOUS | Status: AC | PRN
  Administered 2020-05-16: 11:00:00 250 [IU]

## 2020-05-16 MED ORDER — SUCCINYLCHOLINE CHLORIDE 200 MG/10ML IV SOSY
PREFILLED_SYRINGE | INTRAVENOUS | Status: DC | PRN
  Administered 2020-05-16: 100 mg via INTRAVENOUS

## 2020-05-16 MED ORDER — HYDROCORTISONE NA SUCCINATE PF 100 MG IJ SOLR
INTRAMUSCULAR | Status: AC
  Filled 2020-05-16: qty 2

## 2020-05-16 MED ORDER — ROCURONIUM BROMIDE 10 MG/ML (PF) SYRINGE
PREFILLED_SYRINGE | INTRAVENOUS | Status: DC | PRN
  Administered 2020-05-16: 50 mg via INTRAVENOUS
  Administered 2020-05-16: 20 mg via INTRAVENOUS

## 2020-05-16 MED ORDER — HYDROMORPHONE HCL 1 MG/ML IJ SOLN
INTRAMUSCULAR | Status: AC
  Filled 2020-05-16: qty 0.5

## 2020-05-16 MED ORDER — ROCURONIUM BROMIDE 10 MG/ML (PF) SYRINGE
PREFILLED_SYRINGE | INTRAVENOUS | Status: AC
  Filled 2020-05-16: qty 10

## 2020-05-16 MED ORDER — LACTATED RINGERS IV SOLN: INTRAVENOUS | Status: DC | PRN

## 2020-05-16 MED ORDER — HEMOSTATIC AGENTS (NO CHARGE) OPTIME
TOPICAL | Status: DC | PRN
  Administered 2020-05-16: 1 via TOPICAL

## 2020-05-16 MED ORDER — INSULIN DETEMIR 100 UNIT/ML ~~LOC~~ SOLN
15.0000 [IU] | Freq: Every day | SUBCUTANEOUS | Status: DC
  Administered 2020-05-16 – 2020-05-20 (×5): 15 [IU] via SUBCUTANEOUS
  Filled 2020-05-16 (×6): qty 0.15

## 2020-05-16 MED ORDER — HYDROMORPHONE HCL 1 MG/ML IJ SOLN
0.2500 mg | INTRAMUSCULAR | Status: DC | PRN
  Administered 2020-05-16 (×3): 0.5 mg via INTRAVENOUS
  Filled 2020-05-16: qty 0.5

## 2020-05-16 MED ORDER — SUGAMMADEX SODIUM 200 MG/2ML IV SOLN
INTRAVENOUS | Status: DC | PRN
  Administered 2020-05-16: 400 mg via INTRAVENOUS

## 2020-05-16 MED ORDER — MORPHINE SULFATE (PF) 2 MG/ML IV SOLN
2.0000 mg | INTRAVENOUS | Status: AC | PRN
Start: 2020-05-16 — End: 2020-05-18
  Administered 2020-05-16 – 2020-05-17 (×7): 2 mg via INTRAVENOUS
  Filled 2020-05-16 (×7): qty 1

## 2020-05-16 MED ORDER — MIDAZOLAM HCL 2 MG/2ML IJ SOLN
INTRAMUSCULAR | Status: AC
  Filled 2020-05-16: qty 2

## 2020-05-16 MED ORDER — HEPARIN SOD (PORK) LOCK FLUSH 100 UNIT/ML IV SOLN
INTRAVENOUS | Status: AC
  Filled 2020-05-16: qty 5

## 2020-05-16 MED ORDER — ONDANSETRON HCL 4 MG/2ML IJ SOLN
INTRAMUSCULAR | Status: DC | PRN
  Administered 2020-05-16: 4 mg via INTRAVENOUS

## 2020-05-16 MED ORDER — PROPOFOL 10 MG/ML IV BOLUS
INTRAVENOUS | Status: DC | PRN
  Administered 2020-05-16: 70 mg via INTRAVENOUS

## 2020-05-16 MED ORDER — POVIDONE-IODINE 10 % EX OINT
TOPICAL_OINTMENT | CUTANEOUS | Status: AC
  Filled 2020-05-16: qty 1

## 2020-05-16 MED ORDER — ORAL CARE MOUTH RINSE: 15.0000 mL | Freq: Once | OROMUCOSAL | Status: AC

## 2020-05-16 MED ORDER — LIDOCAINE HCL (PF) 2 % IJ SOLN
INTRAMUSCULAR | Status: AC
  Filled 2020-05-16: qty 5

## 2020-05-16 MED ORDER — MAGNESIUM SULFATE 2 GM/50ML IV SOLN
2.0000 g | Freq: Once | INTRAVENOUS | Status: AC
  Administered 2020-05-16: 07:00:00 2 g via INTRAVENOUS
  Filled 2020-05-16: qty 50

## 2020-05-16 MED ORDER — SEVOFLURANE IN SOLN
RESPIRATORY_TRACT | Status: AC
  Filled 2020-05-16: qty 250

## 2020-05-16 MED ORDER — PHENYLEPHRINE 40 MCG/ML (10ML) SYRINGE FOR IV PUSH (FOR BLOOD PRESSURE SUPPORT)
PREFILLED_SYRINGE | INTRAVENOUS | Status: AC
  Filled 2020-05-16: qty 10

## 2020-05-16 MED ORDER — INSULIN ASPART 100 UNIT/ML ~~LOC~~ SOLN
0.0000 [IU] | SUBCUTANEOUS | Status: DC
  Administered 2020-05-16 – 2020-05-17 (×4): 1 [IU] via SUBCUTANEOUS
  Administered 2020-05-17: 21:00:00 2 [IU] via SUBCUTANEOUS
  Administered 2020-05-17 (×2): 1 [IU] via SUBCUTANEOUS
  Administered 2020-05-18: 13:00:00 2 [IU] via SUBCUTANEOUS
  Administered 2020-05-18 – 2020-05-19 (×3): 1 [IU] via SUBCUTANEOUS
  Administered 2020-05-19: 14:00:00 2 [IU] via SUBCUTANEOUS
  Administered 2020-05-19: 17:00:00 1 [IU] via SUBCUTANEOUS
  Administered 2020-05-19 – 2020-05-21 (×7): 2 [IU] via SUBCUTANEOUS
  Administered 2020-05-21: 12:00:00 3 [IU] via SUBCUTANEOUS
  Administered 2020-05-22 (×3): 1 [IU] via SUBCUTANEOUS
  Administered 2020-05-22 – 2020-05-24 (×7): 2 [IU] via SUBCUTANEOUS
  Administered 2020-05-25: 1 [IU] via SUBCUTANEOUS

## 2020-05-16 MED ORDER — SODIUM CHLORIDE 0.9 % IV SOLN: INTRAVENOUS | Status: DC | PRN

## 2020-05-16 MED ORDER — DILTIAZEM HCL 30 MG PO TABS
30.0000 mg | ORAL_TABLET | Freq: Three times a day (TID) | ORAL | Status: DC
  Administered 2020-05-16 – 2020-05-23 (×19): 30 mg via ORAL
  Filled 2020-05-16 (×19): qty 1

## 2020-05-16 MED ORDER — CHLORHEXIDINE GLUCONATE 0.12 % MT SOLN
15.0000 mL | Freq: Once | OROMUCOSAL | Status: AC
  Administered 2020-05-16: 07:00:00 15 mL via OROMUCOSAL
  Filled 2020-05-16: qty 15

## 2020-05-16 MED ORDER — LACTATED RINGERS IV SOLN: Freq: Once | INTRAVENOUS | Status: AC

## 2020-05-16 SURGICAL SUPPLY — 56 items
APL PRP STRL LF DISP 70% ISPRP (MISCELLANEOUS) ×3
APPLIER CLIP 13 LRG OPEN (CLIP) ×2
APR CLP LRG 13 20 CLIP (CLIP) ×1
BARRIER SKIN 2 3/4 (OSTOMY) ×2 IMPLANT
BARRIER SKIN OD2.25 2 3/4 FLNG (OSTOMY) IMPLANT
BRR SKN FLT 2.75X2.25 2 PC (OSTOMY) ×1
CANISTER WOUND CARE 500ML ATS (WOUND CARE) ×1 IMPLANT
CHLORAPREP W/TINT 26 (MISCELLANEOUS) ×4 IMPLANT
CLIP APPLIE 13 LRG OPEN (CLIP) IMPLANT
CLOTH BEACON ORANGE TIMEOUT ST (SAFETY) ×2 IMPLANT
COVER LIGHT HANDLE STERIS (MISCELLANEOUS) ×4 IMPLANT
COVER WAND RF STERILE (DRAPES) ×2 IMPLANT
DRAPE WARM FLUID 44X44 (DRAPES) ×2 IMPLANT
DRSG VAC ATS LRG SENSATRAC (GAUZE/BANDAGES/DRESSINGS) ×1 IMPLANT
ELECT BLADE 6 FLAT ULTRCLN (ELECTRODE) ×1 IMPLANT
ELECT REM PT RETURN 9FT ADLT (ELECTROSURGICAL) ×2
ELECTRODE REM PT RTRN 9FT ADLT (ELECTROSURGICAL) ×1 IMPLANT
GAUZE SPONGE 4X4 12PLY STRL (GAUZE/BANDAGES/DRESSINGS) ×2 IMPLANT
GLOVE BIO SURGEON STRL SZ 6.5 (GLOVE) ×4 IMPLANT
GLOVE BIOGEL PI IND STRL 7.0 (GLOVE) ×1 IMPLANT
GLOVE BIOGEL PI INDICATOR 7.0 (GLOVE) ×1
GLOVE SURG SS PI 7.5 STRL IVOR (GLOVE) ×4 IMPLANT
GOWN STRL REUS W/TWL LRG LVL3 (GOWN DISPOSABLE) ×6 IMPLANT
HANDLE SUCTION POOLE (INSTRUMENTS) ×1 IMPLANT
HEMOSTAT ARISTA ABSORB 3G PWDR (HEMOSTASIS) ×1 IMPLANT
INST SET MAJOR GENERAL (KITS) ×2 IMPLANT
KIT TURNOVER KIT A (KITS) ×2 IMPLANT
LIGASURE IMPACT 36 18CM CVD LR (INSTRUMENTS) ×2 IMPLANT
MANIFOLD NEPTUNE II (INSTRUMENTS) ×2 IMPLANT
NDL HYPO 21X1.5 SAFETY (NEEDLE) ×1 IMPLANT
NEEDLE HYPO 21X1.5 SAFETY (NEEDLE) ×2 IMPLANT
NS IRRIG 1000ML POUR BTL (IV SOLUTION) ×4 IMPLANT
PACK ABDOMINAL MAJOR (CUSTOM PROCEDURE TRAY) ×2 IMPLANT
PAD ARMBOARD 7.5X6 YLW CONV (MISCELLANEOUS) ×2 IMPLANT
POUCH OSTOMY 2 3/4  H 3804 (WOUND CARE) ×2
POUCH OSTOMY 2 3/4 H 3804 (WOUND CARE) ×1
POUCH OSTOMY 2 PC DRNBL 2.75 (WOUND CARE) IMPLANT
RELOAD PROXIMATE 75MM BLUE (ENDOMECHANICALS) ×4 IMPLANT
RELOAD STAPLE 75 3.8 BLU REG (ENDOMECHANICALS) IMPLANT
RETRACTOR WOUND ALXS 34CM XLRG (MISCELLANEOUS) IMPLANT
RTRCTR WOUND ALEXIS 34CM XLRG (MISCELLANEOUS) ×2
SET BASIN LINEN APH (SET/KITS/TRAYS/PACK) ×2 IMPLANT
SPONGE LAP 18X18 RF (DISPOSABLE) ×4 IMPLANT
STAPLER PROXIMATE 75MM BLUE (STAPLE) ×1 IMPLANT
SUCTION POOLE HANDLE (INSTRUMENTS) ×2
SUT CHROMIC 0 SH (SUTURE) ×4 IMPLANT
SUT PDS AB CT VIOLET #0 27IN (SUTURE) ×3 IMPLANT
SUT SILK 3 0 SH CR/8 (SUTURE) ×1 IMPLANT
SUT VIC AB 3-0 SH 27 (SUTURE) ×4
SUT VIC AB 3-0 SH 27X BRD (SUTURE) IMPLANT
SWAB CULTURE ESWAB REG 1ML (MISCELLANEOUS) ×1 IMPLANT
SWAB CULTURE LIQ STUART DBL (MISCELLANEOUS) ×1 IMPLANT
SYR 20ML LL LF (SYRINGE) ×2 IMPLANT
SYR TOOMEY IRRIG 70ML (MISCELLANEOUS) ×2
SYRINGE TOOMEY IRRIG 70ML (MISCELLANEOUS) IMPLANT
TRAY FOLEY MTR SLVR 16FR STAT (SET/KITS/TRAYS/PACK) ×2 IMPLANT

## 2020-05-16 NOTE — Op Note (Addendum)
[facility] Operative Note  05/16/20  Preoperative Diagnosis: Perforated sigmoid diverticulitis    Postoperative Diagnosis: Same   Procedure(s) Performed:  Partial colectomy, Splenic Flexure Takedown, and End Colostomy    Surgeon: Ann [name]. Ann Haw, MD   Assistants: No qualified resident was available    Anesthesia: General endotracheal   Anesthesiologist: Ann Killings, MD    Specimens:  Sigmoid colon, suture proximal and partial descending colon, suture proximal    Estimated Blood Loss: 100 cc    Blood Replacement: None    Complications: None   Wound Class: Dirty/ Infected Cultures obtained    Operative Indications: Ann [name] is 74 yo with perforated sigmoid colon thought to be from diverticulitis with a large defect in the wall of the colon on CT. She was on Eliquis and was hemodynamically stable but continued to bleed and have pain, and also had a elevated leukocytosis with need for steroids due to adrenal insufficiency.  I was worried that with the size of the defect, her co-morbidities and steroid use that she would not be able to contain whatever seal she had been able to make and would worsen, so we opted to take her back for a end colostomy.  We discussed the risk of bleeding, infection, injury to other organs like the ureter, possibility of finding cancer, possibility of a permanent colostomy, and she opted to proceed.  The day of surgery she had held her Eliquis for 48 hours.    Findings: Perforated sigmoid colon with necrotic appearing wall but sealed, murky fluid and signs of fecal peritonitis in the pelvis around the sigmoid colon    Procedure: The patient was taken to the operating room and placed supine. General endotracheal anesthesia was induced. Intravenous antibiotics were administered per protocol.  An orogastric tube positioned to decompress the stomach. A foley catheter was placed. Attempts at an arterial line on both sides wa done  by myself and Dr. Charna [name] without success in threading the catheter after getting flash of blood. This was aborted preoperatively with plans to reattempt if she became unstable.  The abdomen was prepared and draped in the usual sterile fashion. I had marked her ostomy site left of her umbilicus away from her crease with sitting.  A midline incision was made and carried down through the adipose tissue to the fascia. The fascia was entered with care using scissors.  Murky fluid was encountered and cultures obtained. There was evidence of some peritonitis related to the fecal contamination in the pelvis around the sigmoid colon.  There were minor omental adhesions at the midline that were taken down with cautery. A Wound protector was placed.  The small bowel was packed with laparotomy pads in the left upper quadrant. The sigmoid colon and site of perforation was noted. The wall of the colon appeared necrotic with a contained perforation.  The sigmoid colon was very redundant and adherent to the left pelvic side wall from inflammation.   With care this was taken down with blunt dissection and cautery.  The left ureter was identified deep and protected. The patient had a very redundant colon. The white line of Toldt on the descending colon was taken down and the splenic flexure was mobilized with cautery and the Ligasure ligating the splenocolic attachments and omentum from the transverse colon.    The distal point and proximal point of resection on the sigmoid colon was identified and taken with a 75 mm linear cutting stapler. The mesentery was taken with a  Ligasure. The tissue was very friable and soft and even sealing the vessels with the Ligasure required additional times of ligating.  The ureter was protected. The specimen was marked suture proximal.   The mesentery was scored medially to try to get more length on the descending colon and after the splenic flexure mobilization there was sufficient colon  to reach through the thick abdominal wall but the mesentery was tethering the tissue.  Additional descending colon was taken with a 75 mm linear cutting stapler and the mesentery was taken with a Ligasure. The specimen was marked with suture proximal  This allowed for the colon that remained to be free enough from the mesentery to come through the thick abdominal wall.    Hemostasis was obtained in the left lower quadrant and left pelvis with clips, cautery and Arista was placed due to the general oozing nature of the tissue.    A circular incision was made in the skin lateral to the umbilicus on the left and carried down through the subcutaneous tissue to the fascia. A cruciate incision was made and the muscle was stretched to two fingerbreadth.  The end of the colostomy was pulled through the defect without twisting the colon.  Well over 5cm came over the skin to allow for maturation of the colostomy.  The abdomen was irrigated with warm saline after all laparotomy pads were removed. The fascia was closed with 0 PDS in the standard fashion.  Given the depth of the midline incision at 10cm of adipose in the lower aspect, I opted for wound vacuum placement for improved wound healing and to reduce the risk of infection.    A black sponge was placed into the wound and the wound vacuum tape was placed. A lily pad was placed and hooked to a negative pressure wound vacuum at 125 mm Hg.   From here the colostomy was investigated. The end was opened with scissors and some minor bleeding was noted from the muscular surface. This was controlled with cautery.  The colostomy was matured with 2-0 Chromic gut suture. The ostomy was digitized and the lumen was open down below the fascia.  An ostomy appliance was placed.   Final inspection revealed acceptable hemostasis. All counts were correct at the end of the case. The patient was awakened from anesthesia and extubated without complication.  The patient went to the  PACU in stable condition.  The patient has a long rectal stump without overt signs of diverticula in the area.   Ann Labrum, MD [facility] [address] [place], [place] [address] [phone] (office)

## 2020-05-16 NOTE — TOC Initial Note (Signed)
Transition of Care [facility]) - Initial/Assessment Note    Patient Details  Name: Ann [name] MRN: [id number] Date of Birth: 06-19-1945  [facility] CM/SW Contact:    Boneta Lucks, RN Phone Number: 05/16/2020, 2:34 PM  Clinical Narrative:       Patient admitted with Diverticulitis with perforation, requiring surgery and new colostomy. Patient has a high risk for readmission. Patient is post surg and sleepy, TOC spoke with husband. Husband provides care in the home, [facility] is [facility].  Discussed SNF with husband, he states she has already stated that she is not going to a nursing home. They have hospital bed, bedside commode and walker in the home. TOC to follow.           Expected Discharge Plan: [place] Barriers to Discharge: Continued Medical Work up   Patient Goals and CMS Choice Patient states their goals for this hospitalization and ongoing recovery are:: to go home. CMS Medicare.gov Compare Post Acute Care list provided to:: Patient Represenative (must comment) Choice offered to / list presented to : Spouse  Expected Discharge Plan and Services Expected Discharge Plan: [place] Acute Care Choice: [place] arrangements for the past 2 months: Single Family Home                     Prior Living Arrangements/Services Living arrangements for the past 2 months: Single Family Home Lives with:: Spouse          Need for Family Participation in Patient Care: Yes (Comment) Care giver support system in place?: Yes (comment) Current home services: DME,Home RN Criminal Activity/Legal Involvement Pertinent to Current Situation/Hospitalization: No - Comment as needed  Activities of Daily Living Home Assistive Devices/Equipment: None ADL Screening (condition at time of admission) Patient's cognitive ability adequate to safely complete daily activities?: Yes Is the patient deaf or  have difficulty hearing?: No Does the patient have difficulty seeing, even when wearing glasses/contacts?: No Does the patient have difficulty concentrating, remembering, or making decisions?: No Patient able to express need for assistance with ADLs?: Yes Does the patient have difficulty dressing or bathing?: No Independently performs ADLs?: Yes (appropriate for developmental age) Does the patient have difficulty walking or climbing stairs?: No Weakness of Legs: None Weakness of Arms/Hands: None  Permission Sought/Granted      Share Information with NAME: [name]     Permission granted to share info w Relationship: Husband     Emotional Assessment      Alcohol / Substance Use: Not Applicable Psych Involvement: No (comment)  Admission diagnosis:  Colon, diverticulosis [K57.30] Rectal bleeding [K62.5] Acute GI bleeding [K92.2] Current use of anticoagulant therapy [Z79.01] Sepsis ([place]) [A41.9] Patient Active Problem List   Diagnosis Date Noted  . Sigmoid Diverticulitis with Perforation-- 05/15/2020  . Diverticulitis of large intestine with perforation with bleeding   . Sepsis Due to Sigmoid Diverticulitis with Perforation 05/14/2020  . Acute GI bleeding 05/14/2020  . Acute on chronic anemia-Due to Acute Blood Loss 05/14/2020  . Acute respiratory failure with hypoxia ([place]) 01/24/2020  . Acute on chronic diastolic CHF (congestive heart failure) ([place]) 01/24/2020  . Atrial fibrillation with RVR ([place]) 01/23/2020  . Anxiety   . Type 2 diabetes mellitus ([place])   . Weakness 12/10/2019  . Acute pain of left knee 08/31/2019  . History of DVT (deep vein thrombosis)   . Coronary artery  disease of native artery of native heart with stable angina pectoris ([place])   . Chronic obstructive pulmonary disease ([place])   . Atrial fibrillation with rapid ventricular response ([place]) 07/13/2019  . COPD exacerbation ([place]) 04/04/2018  . Comprehensive diabetic foot examination, type 2 DM, encounter for  ([place]) 04/04/2018  . PAF (paroxysmal atrial fibrillation) ([place]) 04/04/2018  . Constipation by delayed colonic transit 10/23/2016  . Fatty liver 12/11/2015  . GERD (gastroesophageal reflux disease) 07/14/2015  . VRE (vancomycin resistant enterococcus) culture positive 10/10/2012  . Nonsustained ventricular tachycardia ([place]) 10/10/2012  . Cellulitis of arm, right 10/06/2012  . Hydronephrosis 09/15/2012  . Abdominal mass, right lower quadrant 09/10/2012  . Microcytic anemia 08/22/2012  . Acute on chronic diastolic heart failure ([place]) 08/06/2012  . UTI (urinary tract infection) 07/30/2012  . Atrial fibrillation ([place]) 07/02/2012  . Vitamin B12 deficiency 06/28/2012  . Sinusitis chronic, frontal 06/28/2012  . Obesity, Class III, BMI 40-49.9 (morbid obesity) ([place]) 06/28/2012  . Chronic kidney disease, stage III (moderate) ([place]) 06/28/2012  . ARF (acute renal failure) ([place]) 06/27/2012  . Adrenal insufficiency ([place]) 01/07/2012  . Uncontrolled type 2 diabetes mellitus with hyperglycemia ([place]) 04/19/2011  . Chronic diastolic heart failure ([place]) 04/19/2011  . GANGLION OF TENDON SHEATH 02/21/2010  . Acquired hypothyroidism 12/01/2008  . Mixed hyperlipidemia 12/01/2008  . Essential hypertension 12/01/2008  . Coronary atherosclerosis 12/01/2008  . RENAL FAILURE, CHRONIC 12/01/2008   PCP:  Rosita Fire, MD Pharmacy:   [facility] [address] [place]. [place] [address] Phone: [phone] Fax: [phone]  [facility] [place], [place] - [address] [place] [address] Phone: [phone] Fax: [phone]   Readmission Risk Interventions Readmission Risk Prevention Plan 05/16/2020 01/24/2020  Transportation Screening Complete Complete  HRI or Home Care Consult - Complete  Social Work Consult for [place] Planning/Counseling - Complete  Palliative Care  Screening - Not Applicable  Medication Review Press photographer) Complete Complete  HRI or Home Care Consult Complete -  SW Recovery Care/Counseling Consult Complete -  Palliative Care Screening Not Applicable -  [place] Patient Refused -  Some recent data might be hidden

## 2020-05-16 NOTE — Transfer of Care (Signed)
Immediate Anesthesia Transfer of Care Note  Patient: Ann [name]  Procedure(s) Performed: END COLOSTOMY PLACEMENT (N/A )  Patient Location: PACU  Anesthesia Type:General  Level of Consciousness: awake  Airway & Oxygen Therapy: Patient Spontanous Breathing and Patient connected to face mask oxygen  Post-op Assessment: Report given to RN, Post -op Vital signs reviewed and stable and Patient moving all extremities X 4  Post vital signs: Reviewed and stable  Last Vitals:  Vitals Value Taken Time  BP 125/84 05/16/20 1031  Temp    Pulse 252 05/16/20 1033  Resp 15 05/16/20 1033  SpO2 97 % 05/16/20 1033  Vitals shown include unvalidated device data.  Last Pain:  Vitals:   05/16/20 0640  TempSrc:   PainSc: 0-No pain         Complications: No complications documented.

## 2020-05-16 NOTE — Progress Notes (Signed)
[facility]  Notified [name] surgery completed. Patient did well. End colostomy. Ostomy RN ordered. Clears for now.   Labs tomorrow. Wound vac to midline Ostomy RN PRN for pain  Updated team.   Curlene Labrum, MD [facility] [address] [place], [place] [address] [phone] (office)

## 2020-05-16 NOTE — Progress Notes (Signed)
Awakens to name. Moaning. Groaning. Med as noted for postop abd.

## 2020-05-16 NOTE — Anesthesia Procedure Notes (Signed)
Procedure Name: Intubation Date/Time: 05/16/2020 7:35 AM Performed by: Orlie Dakin, CRNA Pre-anesthesia Checklist: Patient identified, Emergency Drugs available, Suction available and Patient being monitored Patient Re-evaluated:Patient Re-evaluated prior to induction Oxygen Delivery Method: Circle system utilized Preoxygenation: Pre-oxygenation with 100% oxygen Induction Type: IV induction Laryngoscope Size: Miller and 3 Grade View: Grade I Tube type: Oral Tube size: 7.5 mm Number of attempts: 1 Airway Equipment and Method: Stylet Placement Confirmation: ETT inserted through vocal cords under direct vision,  positive ETCO2 and breath sounds checked- equal and bilateral Secured at: 23 cm Tube secured with: Tape Dental Injury: Teeth and Oropharynx as per pre-operative assessment

## 2020-05-16 NOTE — Progress Notes (Addendum)
[facility]  Confused and small vomit when I went to see her this evening. Ostomy is dusky and edematous. Bowel sweat/ blood in bag.   Hopefully just mucosal sloughing, will have to monitor closely. Patient has large thicken abdomen to pull ostomy through so is at risk for stenosis, necrosis and need for revision.  Ostomy RN consulted. Wound vac in place and will change Thursday. If has more emesis may need to make NPO and place NG. Discussed with RN.    Curlene Labrum, MD [facility] [address] [place], [place] [address] [phone] (office)

## 2020-05-16 NOTE — Progress Notes (Signed)
Dr Charna Elizabeth requested pt to be instructed on incentive spirometer. Pt unable to comply.

## 2020-05-16 NOTE — Progress Notes (Signed)
Dr Charna Elizabeth at bedside to check elevated pulse. Monitor displays atrial fib. Orders given for metoprolol.

## 2020-05-16 NOTE — Progress Notes (Signed)
Red port to left arm PICC IV fluids d/c'd. Red port heparin flushed and locked.

## 2020-05-16 NOTE — Consult Note (Addendum)
[place] Nurse ostomy consult note Pt had colostomy surgery today and is still in the perioperative setting at this time.  A WOC team member will plan to see tomorrow for initial pouch change and teaching session.  Julien Girt MSN, RN, [place], [place], [place]

## 2020-05-16 NOTE — Anesthesia Postprocedure Evaluation (Signed)
Anesthesia Post Note  Patient: Ann [name]  Procedure(s) Performed: END COLOSTOMY PLACEMENT (N/A )  Patient location during evaluation: PACU Anesthesia Type: General Level of consciousness: awake and alert and oriented Pain management: pain level controlled Respiratory status: spontaneous breathing, respiratory function stable and patient connected to face mask oxygen Cardiovascular status: blood pressure returned to baseline and tachycardic (h/o A Fib, Discontinued all her meds for a fib because of low blood pressure.) Postop Assessment: no apparent nausea or vomiting Anesthetic complications: no   No complications documented.   Last Vitals:  Vitals:   05/16/20 0700 05/16/20 0715  BP: 110/73   Pulse: 60 (!) 154  Resp: 14 (!) 23  Temp:    SpO2: 95% 95%    Last Pain:  Vitals:   05/16/20 0640  TempSrc:   PainSc: 0-No pain                 Garland Smouse C L[place]ah Gloster

## 2020-05-16 NOTE — Progress Notes (Signed)
PROGRESS NOTE        PATIENT DETAILS Name: Ann [name] Age: 74 y.o. Sex: female Date of Birth: [birth year] Admit Date: 05/14/2020 Admitting Physician Evalee Mutton Kristeen Mans, MD [id number], Tesfaye, MD  Brief Narrative: Patient is a 74 y.o. female chronic diastolic heart failure, chronic atrial fibrillation, adrenal insufficiency, DM-2, COPD, CAD-s/p PCI-presented to the ED with 1 day history of left lower quadrant abdominal pain and hematochezia.  Further evaluation revealed sigmoid diverticulitis with perforation.  Hospital course complicated by acute blood loss anemia and hypotension-requiring transfer to the ICU on 12/20 for close monitoring.  Evaluated by general surgery-with plans to perform colectomy/colostomy on 12/21.  See below for further details.  Significant events: 12/19>> admit for sigmoid diverticulitis with perforation-hematochezia with acute blood loss anemia 12/20>> multiple episodes of hematochezia overnight-hypotension-transfer to ICU 12/21>>To OR-colectomy/colostomy  Significant studies: 12/19>> CT abdomen/pelvis: Sigmoid diverticulitis with contained perforation.  Antimicrobial therapy: Zosyn: 12/19>>  Microbiology data: 12/21>> intraoperative/intraperitoneal cultures: Pending  Procedures : 12/21>> partial colectomy with end colostomy-Dr Constance Haw  Consults: GI, general surgery  DVT Prophylaxis : SCD's Start: 05/15/20 1040 SCDs Start: 05/14/20 1348 Place TED hose Start: 05/14/20 1348   Subjective: Back from the PACU-awake/alert-some pain at the operative site.  Assessment/Plan: Hypotension: Resolved multifactorial etiology-secondary to acute blood loss, sepsis-and relative adrenal insufficiency.  Improved after PRBC transfusion, IV fluid-patient and initialing of stress dose steroids.  Continue to hold all antihypertensives.  Sepsis due to sigmoid diverticulitis with contained perforation: Sepsis physiology has  improved-underwent colectomy with colostomy placement on 12/21-discussed with Dr. Eulis Manly antimicrobial therapy with Zosyn x5 additional days due to peritoneal contamination.  Await intraoperative cultures.  General surgery following.  Lower GI bleeding/Hematochezia with acute blood loss anemia: Unusual for diverticular bleeding in the setting of diverticulitis-May have underlying malignancy.  S/p 2 units of PRBC transfusion-hemoglobin stable.  GI/general surgery following-she is s/p colectomy-probably does not require further work-up.  Follow surgical biopsies  Chronic atrial fibrillation with RVR: Rate slowly creeping up-likely due to pain at surgical site-Cardizem held on 12/20 due to hypotension-we will resume short acting Cardizem-once rate better controlled-we can switch her back to Cardizem.  Eliquis remains on hold-resume when okay with general surgery.   Chronic diastolic heart failure: Volume status stable-Lasix/other therapies on hold.  CAD-history of prior PCI: No anginal symptoms-not on aspirin as patient on anticoagulation.  COPD: Stable-continue bronchodilators.  AKI: Mild-likely hemodynamically mediated-with some element of contrast-induced nephropathy (received contrast for CT abdomen on admission).  Renal function gradually improving-continue to avoid nephrotoxic agents.   Relative adrenal insufficiency: On 10 mg of prednisone daily at home-not started on admission-see above regarding hypotension-now on stress dose hydrocortisone- will gradually taper down to usual prednisone dosing over the next few days.    History of vitamin B12 deficiency: Continue supplementation  Hypothyroidism (TSH 1.22 on 8/12): On Synthroid  DM-2 (A1c 7.1 on 11/16): CBGs relatively stable-since patient n.p.o.-decrease detemir to 15 units daily-change SSI to every 4 hours.  Follow and adjust  Recent Labs    05/15/20 1647 05/15/20 2047 05/16/20 1036  GLUCAP 113* 127* 138*    Morbid  Obesity: Estimated body mass index is 48.86 kg/m as calculated from the following:   Height as of this encounter: 5\' 3"  (1.6 m).   Weight as of this encounter: 125.1 kg.    Diet: Diet Order  Diet NPO time specified  Diet effective midnight                  Code Status: DNR  Family Communication: Spouse [name] [id number])-updated on 12/20-General surgery has spoken with family today-we will update over the next few days  Disposition Plan: Status [id number]  The patient will require care spanning > 2 midnights and should be moved to inpatient because: Hemodynamically unstable  Dispo: The patient is from: Home              Anticipated d/c is to: SNF              Anticipated d/c date is: > 3 days              Patient currently is not medically stable to d/c.  Barriers to Discharge: Hypotension/sepsis/acute blood loss anemia-due to perforated diverticulitis-s/p right colectomy/ostomy on 12/21  Antimicrobial agents: Anti-infectives (From admission, onward)   Start     Dose/Rate Route Frequency Ordered Stop   05/16/20 0600  cefoTEtan (CEFOTAN) 2 g in sodium chloride 0.9 % 100 mL IVPB        2 g 200 mL/hr over 30 Minutes Intravenous On call to O.R. 05/15/20 1039 05/16/20 0825   05/15/20 1000  [MAR Hold]  piperacillin-tazobactam (ZOSYN) IVPB 3.375 g        (MAR Hold since Tue 05/16/2020 at 0724.Hold Reason: Transfer to a Procedural area.)   3.375 g 12.5 mL/hr over 240 Minutes Intravenous Every 8 hours 05/15/20 0000     05/15/20 0100  piperacillin-tazobactam (ZOSYN) IVPB 3.375 g        3.375 g 12.5 mL/hr over 240 Minutes Intravenous  Once 05/14/20 2357 05/15/20 1322       Time spent: 35 minutes-Greater than 50% of this time was spent in counseling, explanation of diagnosis, planning of further management, and coordination of care.  MEDICATIONS: Scheduled Meds: . [MAR Hold] atorvastatin  80 mg Oral q1800  . [MAR Hold] Chlorhexidine Gluconate Cloth  6 each  Topical Daily  . [MAR Hold] cholecalciferol  5,000 Units Oral Daily  . [MAR Hold] darifenacin  7.5 mg Oral Daily  . [MAR Hold] gabapentin  400 mg Oral TID  . [MAR Hold] hydrocortisone sod succinate (SOLU-CORTEF) inj  50 mg Intravenous Q8H  . [MAR Hold] insulin aspart  0-15 Units Subcutaneous TID WC  . [MAR Hold] insulin aspart  0-5 Units Subcutaneous QHS  . [MAR Hold] insulin detemir  30 Units Subcutaneous Q2200  . [MAR Hold] levothyroxine  150 mcg Oral QAC breakfast  . [MAR Hold] mirabegron ER  50 mg Oral Daily  . [MAR Hold] pantoprazole (PROTONIX) IV  40 mg Intravenous Q12H  . [MAR Hold] sertraline  50 mg Oral QHS  . [MAR Hold] sodium chloride flush  10-40 mL Intracatheter Q12H  . [MAR Hold] sodium chloride flush  3 mL Intravenous Q12H  . [MAR Hold] sodium chloride flush  3 mL Intravenous Q12H  . [MAR Hold] umeclidinium bromide  1 puff Inhalation Daily  . [MAR Hold] cyanocobalamin  1,000 mcg Oral Daily   Continuous Infusions: . [MAR Hold] sodium chloride    . [MAR Hold] piperacillin-tazobactam (ZOSYN)  IV 3.375 g (05/16/20 0500)  . [MAR Hold] sodium chloride Stopped (05/15/20 0945)   PRN Meds:.[MAR Hold] sodium chloride, [MAR Hold] acetaminophen **OR** [MAR Hold] acetaminophen, [MAR Hold] albuterol, [MAR Hold] bisacodyl, hemostatic agents, [MAR Hold]  morphine injection, [MAR Hold] nitroGLYCERIN, [MAR Hold] ondansetron **OR** [MAR Hold] ondansetron (  ZOFRAN) IV, [MAR Hold] polyvinyl alcohol, [MAR Hold] sodium chloride, [MAR Hold] sodium chloride flush, [MAR Hold] sodium chloride flush, sodium chloride irrigation, [MAR Hold] traZODone   PHYSICAL EXAM: Vital signs: Vitals:   05/16/20 0600 05/16/20 0640 05/16/20 0700 05/16/20 0715  BP: 109/88 111/72 110/73   Pulse: 94 (!) 105 60 (!) 154  Resp: 17 16 14  (!) 23  Temp:  97.8 F (36.6 C)    TempSrc:      SpO2: 91% 96% 95% 95%  Weight:      Height:       Filed Weights   05/15/20 0806 05/15/20 1651 05/16/20 0508  Weight: 127.3 kg  124.8 kg 125.1 kg   Body mass index is 48.86 kg/m.  Gen Exam:Alert awake-not in any distress HEENT:atraumatic, normocephalic Chest: B/L clear to auscultation anteriorly CVS:S1S2 irregular Abdomen: Soft-appropriately tender. Extremities:no edema Neurology: Non focal Skin: no rash  I have personally reviewed following labs and imaging studies  LABORATORY DATA: CBC: Recent Labs  Lab 05/14/20 1201 05/15/20 0345 05/15/20 0859 05/15/20 2045 05/16/20 0444  WBC 16.9* 19.5* 19.2* 18.4* 18.6*  HGB 8.5* 9.3* 9.1* 8.9* 9.1*  HCT 29.8* 31.3* 31.3* 30.2* 31.2*  MCV 83.9 83.0 83.9 83.9 84.3  PLT 343 255 229 201 [id number]    Basic Metabolic Panel: Recent Labs  Lab 05/14/20 1201 05/15/20 0345 05/15/20 0859 05/15/20 2045 05/16/20 0444  NA 139 136 137 137 140  K 3.9 3.3* 3.6 4.0 3.6  CL 99 97* 99 101 103  CO2 30 28 27 25 28   GLUCOSE 128* 125* 111* 143* 138*  BUN 18 22 26* 27* 25*  CREATININE 1.07* 1.34* 1.46* 1.31* 1.05*  CALCIUM 8.6* 7.7* 7.5* 7.4* 7.6*  MG  --   --   --  1.4*  --     GFR: Estimated Creatinine Clearance: 60.5 mL/min (A) (by C-G formula based on SCr of 1.05 mg/dL (H)).  Liver Function Tests: Recent Labs  Lab 05/14/20 1201 05/16/20 0444  AST 20 20  ALT 14 13  ALKPHOS 54 49  BILITOT 0.5 0.6  PROT 7.0 6.2*  ALBUMIN 3.7 3.0*   No results for input(s): LIPASE, AMYLASE in the last 168 hours. No results for input(s): AMMONIA in the last 168 hours.  Coagulation Profile: Recent Labs  Lab 05/14/20 1246  INR 1.4*    Cardiac Enzymes: No results for input(s): CKTOTAL, CKMB, CKMBINDEX, TROPONINI in the last 168 hours.  BNP (last 3 results) No results for input(s): PROBNP in the last 8760 hours.  Lipid Profile: No results for input(s): CHOL, HDL, LDLCALC, TRIG, CHOLHDL, LDLDIRECT in the last 72 hours.  Thyroid Function Tests: No results for input(s): TSH, T4TOTAL, FREET4, T3FREE, THYROIDAB in the last 72 hours.  Anemia Panel: Recent Labs     05/15/20 0345  VITAMINB12 2,550*  FOLATE 8.3    Urine analysis:    Component Value Date/Time   COLORURINE YELLOW 01/23/2020 1819   APPEARANCEUR CLEAR 01/23/2020 1819   LABSPEC 1.012 01/23/2020 1819   PHURINE 5.0 01/23/2020 1819   GLUCOSEU NEGATIVE 01/23/2020 1819   HGBUR NEGATIVE 01/23/2020 1819   BILIRUBINUR NEGATIVE 01/23/2020 1819   KETONESUR NEGATIVE 01/23/2020 1819   PROTEINUR NEGATIVE 01/23/2020 1819   UROBILINOGEN 0.2 03/15/2013 1504   NITRITE NEGATIVE 01/23/2020 1819   LEUKOCYTESUR NEGATIVE 01/23/2020 1819    Sepsis Labs: Lactic Acid, Venous    Component Value Date/Time   LATICACIDVEN 1.2 05/15/2020 0345    MICROBIOLOGY: Recent Results (from the past 240 hour(s))  Resp Panel by RT-PCR (Flu A&B, Covid) Nasopharyngeal Swab     Status: None   Collection Time: 05/14/20 12:05 PM   Specimen: Nasopharyngeal Swab; Nasopharyngeal(NP) swabs in vial transport medium  Result Value Ref Range Status   SARS Coronavirus 2 by RT PCR NEGATIVE NEGATIVE Final    Comment: (NOTE) SARS-CoV-2 target nucleic acids are NOT DETECTED.  The SARS-CoV-2 RNA is generally detectable in upper respiratory specimens during the acute phase of infection. The lowest concentration of SARS-CoV-2 viral copies this assay can detect is 138 copies/mL. A negative result does not preclude SARS-Cov-2 infection and should not be used as the sole basis for treatment or other patient management decisions. A negative result may occur with  improper specimen collection/handling, submission of specimen other than nasopharyngeal swab, presence of viral mutation(s) within the areas targeted by this assay, and inadequate number of viral copies(<138 copies/mL). A negative result must be combined with clinical observations, patient history, and epidemiological information. The expected result is Negative.  Fact Sheet for Patients:  EntrepreneurPulse.com.au  Fact Sheet for Healthcare Providers:   IncredibleEmployment.be  This test is no t yet approved or cleared by the [place] FDA and  has been authorized for detection and/or diagnosis of SARS-CoV-2 by FDA under an Emergency Use Authorization (EUA). This EUA will remain  in effect (meaning this test can be used) for the duration of the COVID-19 declaration under Section 564(b)(1) of the Act, 21 U.S.C.section 360bbb-3(b)(1), unless the authorization is terminated  or revoked sooner.       Influenza A by PCR NEGATIVE NEGATIVE Final   Influenza B by PCR NEGATIVE NEGATIVE Final    Comment: (NOTE) The Xpert Xpress SARS-CoV-2/FLU/RSV plus assay is intended as an aid in the diagnosis of influenza from Nasopharyngeal swab specimens and should not be used as a sole basis for treatment. Nasal washings and aspirates are unacceptable for Xpert Xpress SARS-CoV-2/FLU/RSV testing.  Fact Sheet for Patients: EntrepreneurPulse.com.au  Fact Sheet for Healthcare Providers: IncredibleEmployment.be  This test is not yet approved or cleared by the [place] FDA and has been authorized for detection and/or diagnosis of SARS-CoV-2 by FDA under an Emergency Use Authorization (EUA). This EUA will remain in effect (meaning this test can be used) for the duration of the COVID-19 declaration under Section 564(b)(1) of the Act, 21 U.S.C. section 360bbb-3(b)(1), unless the authorization is terminated or revoked.  Performed at [facility], [name], [place], [place] [address]   MRSA PCR Screening     Status: None   Collection Time: 05/15/20  8:51 AM   Specimen: Nasal Mucosa; Nasopharyngeal  Result Value Ref Range Status   MRSA by PCR NEGATIVE NEGATIVE Final    Comment:        The GeneXpert MRSA Assay (FDA approved for NASAL specimens only), is one component of a comprehensive MRSA colonization surveillance program. It is not intended to diagnose MRSA infection nor to guide  or monitor treatment for MRSA infections. Performed at [facility], [address], [place], [place] [address]     RADIOLOGY STUDIES/RESULTS: CT ABDOMEN PELVIS W CONTRAST  Result Date: 05/14/2020 CLINICAL DATA:  Lower abdominal pain, rectal bleeding EXAM: CT ABDOMEN AND PELVIS WITH CONTRAST TECHNIQUE: Multidetector CT imaging of the abdomen and pelvis was performed using the standard protocol following bolus administration of intravenous contrast. CONTRAST:  139mL OMNIPAQUE IOHEXOL 300 MG/ML  SOLN COMPARISON:  06/17/2017 FINDINGS: Lower chest: Trace bilateral pleural effusions. No acute airspace disease. Hepatobiliary: No focal liver abnormality is  seen. Status post cholecystectomy. No biliary dilatation. Pancreas: Unremarkable. No pancreatic ductal dilatation or surrounding inflammatory changes. Spleen: Normal in size without focal abnormality. Adrenals/Urinary Tract: Adrenal glands are unremarkable. Kidneys are normal, without renal calculi, focal lesion, or hydronephrosis. Bladder is unremarkable. Stomach/Bowel: There is marked inflammation of the mid sigmoid colon, with wall thickening and pericolonic fat stranding. Findings are consistent with sigmoid diverticulitis. Additionally, there is a large contained perforation containing gas and stool on image 66, measuring 6.9 x 4.5 cm. No evidence of abscess at this time. No bowel obstruction or ileus. Vascular/Lymphatic: Aortic atherosclerosis. No enlarged abdominal or pelvic lymph nodes. Reproductive: Status post hysterectomy. No adnexal masses. Other: No free fluid or free intraperitoneal gas. No abdominal wall hernia. Musculoskeletal: No acute or destructive bony lesions. Significant multilevel lumbar spondylosis greatest at L2-3 and L3-4. Reconstructed images demonstrate no additional findings. IMPRESSION: 1. Sigmoid diverticulitis, with contained perforation measuring 6.9 x 4.5 cm as above. No evidence of abscess at this time. 2.  Aortic  Atherosclerosis (ICD10-I70.0). Critical Value/emergent results were called by telephone at the time of interpretation on 05/14/2020 at 7:39 pm to provider NP [facility] , who verbally acknowledged these results. Electronically Signed   By: Randa Ngo M.D.   On: 05/14/2020 19:43   DG CHEST PORT 1 VIEW  Result Date: 05/15/2020 CLINICAL DATA:  PICC line placement EXAM: PORTABLE CHEST 1 VIEW COMPARISON:  Portable exam 1145 hours compared 01/23/2020 FINDINGS: LEFT arm PICC line tip projects over SVC. Enlargement of cardiac silhouette. Atherosclerotic calcification aorta. Pulmonary vascularity normal. Mediastinum appears prominent but this may be related to AP technique. Subsegmental atelectasis at mid LEFT lung and minimally RIGHT base. No infiltrate, pleural effusion or pneumothorax. Bones demineralized with BILATERAL glenohumeral degenerative changes and prior cervical fusion. IMPRESSION: Tip of LEFT arm PICC line projects over SVC. Enlargement of cardiac silhouette. Mild scattered atelectasis. Electronically Signed   By: Lavonia Dana M.D.   On: 05/15/2020 11:56   [place] EKG SITE RITE  Result Date: 05/15/2020 If Site Rite image not attached, placement could not be confirmed due to current cardiac rhythm.    LOS: 1 day   Oren Binet, MD  [facility]    To contact the attending provider between 7A-7P or the covering provider during after hours 7P-7A, please log into the web site [email or web address] and access using universal [place] password for that web site. If you do not have the password, please call the hospital operator.  05/16/2020, 11:15 AM

## 2020-05-16 NOTE — Interval H&P Note (Signed)
History and Physical Interval Note:  05/16/2020 7:10 AM  Ann [name]  has presented today for surgery, with the diagnosis of perforated diverticulitis.  The various methods of treatment have been discussed with the patient and family. After consideration of risks, benefits and other options for treatment, the patient has consented to  Procedure(s): END COLOSTOMY PLACEMENT (N/A) as a surgical intervention.  The patient's history has been reviewed, patient examined, no change in status, stable for surgery.  I have reviewed the patient's chart and labs.  Questions were answered to the patient's satisfaction.    Continue WBC elevation and pain. Given steroids and health issues worried that large perforation is not going to remain sealed. Plan for colostomy.   Virl Cagey

## 2020-05-17 ENCOUNTER — Encounter (HOSPITAL_COMMUNITY): Payer: Self-pay | Admitting: General Surgery

## 2020-05-17 DIAGNOSIS — D649 Anemia, unspecified: Secondary | ICD-10-CM

## 2020-05-17 DIAGNOSIS — E039 Hypothyroidism, unspecified: Secondary | ICD-10-CM

## 2020-05-17 LAB — COMPREHENSIVE METABOLIC PANEL
ALT: 16 U/L (ref 0–44)
AST: 34 U/L (ref 15–41)
Albumin: 2.6 g/dL — ABNORMAL LOW (ref 3.5–5.0)
Alkaline Phosphatase: 46 U/L (ref 38–126)
Anion gap: 9 (ref 5–15)
BUN: 29 mg/dL — ABNORMAL HIGH (ref 8–23)
CO2: 26 mmol/L (ref 22–32)
Calcium: 7.9 mg/dL — ABNORMAL LOW (ref 8.9–10.3)
Chloride: 105 mmol/L (ref 98–111)
Creatinine, Ser: 1.05 mg/dL — ABNORMAL HIGH (ref 0.44–1.00)
GFR, Estimated: 56 mL/min — ABNORMAL LOW (ref >60)
Glucose, Bld: 141 mg/dL — ABNORMAL HIGH (ref 70–99)
Potassium: 4.2 mmol/L (ref 3.5–5.1)
Sodium: 140 mmol/L (ref 135–145)
Total Bilirubin: 0.5 mg/dL (ref 0.3–1.2)
Total Protein: 5.8 g/dL — ABNORMAL LOW (ref 6.5–8.1)

## 2020-05-17 LAB — CBC
HCT: 31.5 % — ABNORMAL LOW (ref 36.0–46.0)
Hemoglobin: 8.8 g/dL — ABNORMAL LOW (ref 12.0–15.0)
MCH: 24 pg — ABNORMAL LOW (ref 26.0–34.0)
MCHC: 27.9 g/dL — ABNORMAL LOW (ref 30.0–36.0)
MCV: 86.1 fL (ref 80.0–100.0)
Platelets: 184 10*3/uL (ref 150–400)
RBC: 3.66 MIL/uL — ABNORMAL LOW (ref 3.87–5.11)
RDW: 18.2 % — ABNORMAL HIGH (ref 11.5–15.5)
WBC: 13.6 10*3/uL — ABNORMAL HIGH (ref 4.0–10.5)
nRBC: 0 % (ref 0.0–0.2)

## 2020-05-17 LAB — GLUCOSE, CAPILLARY
Glucose-Capillary: 116 mg/dL — ABNORMAL HIGH (ref 70–99)
Glucose-Capillary: 131 mg/dL — ABNORMAL HIGH (ref 70–99)
Glucose-Capillary: 132 mg/dL — ABNORMAL HIGH (ref 70–99)
Glucose-Capillary: 136 mg/dL — ABNORMAL HIGH (ref 70–99)
Glucose-Capillary: 150 mg/dL — ABNORMAL HIGH (ref 70–99)
Glucose-Capillary: 192 mg/dL — ABNORMAL HIGH (ref 70–99)

## 2020-05-17 MED ORDER — LACTATED RINGERS IV SOLN: INTRAVENOUS | Status: DC

## 2020-05-17 NOTE — Evaluation (Signed)
Physical Therapy Evaluation Patient Details Name: Ann [name] MRN: [id number] DOB: 1946/02/01 Today's Date: 05/17/2020   History of Present Illness  Ann [name]  is a 74 y.o. female, s/p colostomy on 05/16/20 due to acute GI bleeding, who is a reformed smoker with past medical history relevant for CAD with prior stents, prior history of diverticulosis/diverticulitis, GERD and erosive gastritis, chronic atrial fibrillation on anticoagulation with Eliquis,  history of DVT , history of adrenal insufficiency,hypothyroidism, COPD, type 2 diabetes on insulin, and morbid obesity as well as hypertension presents to the ED with abdominal pain of about 24 hours duration it is generalized patient rated at about 9/10 associated with chills and nausea but no emesis    Clinical Impression  Patient demonstrates slow labored movement for sitting up at bedside, unable to supine to sit due to abdominal discomfort, had to roll to side with use of bed rail Mod assistance to sit up from side lying position, very unsteady on feet and limited to a few steps at bedside before having to sit.  Patent tolerated sitting up in chair after therapy while on 2 LPM O2 with SpO2 at 92-93% - RN aware.  Patient will benefit from continued physical therapy in hospital and recommended venue below to increase strength, balance, endurance for safe ADLs and gait.     Follow Up Recommendations SNF;Supervision for mobility/OOB;Supervision - Intermittent    Equipment Recommendations  None recommended by PT    Recommendations for Other Services       Precautions / Restrictions Precautions Precautions: Fall Restrictions Weight Bearing Restrictions: No      Mobility  Bed Mobility Overal bed mobility: Needs Assistance Bed Mobility: Rolling;Sidelying to Sit Rolling: Min assist;Mod assist Sidelying to sit: Mod assist;Max assist       General bed mobility comments: slow labored movement, required use of bed rail for  rolling to side and sitting up from sidelying position    Transfers Overall transfer level: Needs assistance Equipment used: Rolling walker (2 wheeled) Transfers: Sit to/from [facility] Sit to Stand: Mod assist Stand pivot transfers: Mod assist       General transfer comment: increased time, labored movement  Ambulation/Gait Ambulation/Gait assistance: Mod assist Gait Distance (Feet): 4 Feet Assistive device: Rolling walker (2 wheeled) Gait Pattern/deviations: Decreased step length - right;Decreased step length - left;Decreased stride length Gait velocity: decreased   General Gait Details: limited to 4-5 slow labored steps at bedside due to generalized weakness and fatigue  Stairs            Wheelchair Mobility    Modified Rankin (Stroke Patients Only)       Balance Overall balance assessment: Needs assistance Sitting-balance support: Feet supported;No upper extremity supported Sitting balance-Leahy Scale: Fair Sitting balance - Comments: seated at EOB   Standing balance support: During functional activity;Bilateral upper extremity supported Standing balance-Leahy Scale: Poor Standing balance comment: fair/poor using RW                             Pertinent Vitals/Pain Pain Assessment: 0-10 Pain Score: 6  Pain Location: stomach Pain Descriptors / Indicators: Sore Pain Intervention(s): Limited activity within patient's tolerance;Monitored during session;Repositioned    Home Living Family/patient expects to be discharged to:: Private residence Living Arrangements: Spouse/significant other;Children;Other relatives Available Help at Discharge: Family;Available 24 hours/day Type of Home: House Home Access: Ramped entrance     Home Layout: Two level;Able to live on main level with  bedroom/bathroom Home Equipment: Walker - 4 wheels;Bedside commode;Shower seat;Grab bars - toilet;Grab bars - tub/shower;Wheelchair - manual;Hospital  bed      Prior Function Level of Independence: Needs assistance   Gait / Transfers Assistance Needed: household ambulator using Rollator  ADL's / Homemaking Assistance Needed: Pt reports husband supervises for safety but she is able to complete basic ADLs independently. Family assists with meal preparation, housekeeping        Hand Dominance   Dominant Hand: Right    Extremity/Trunk Assessment   Upper Extremity Assessment Upper Extremity Assessment: Defer to OT evaluation    Lower Extremity Assessment Lower Extremity Assessment: Generalized weakness    Cervical / Trunk Assessment Cervical / Trunk Assessment: Normal  Communication   Communication: No difficulties  Cognition Arousal/Alertness: Awake/alert Behavior During Therapy: WFL for tasks assessed/performed Overall Cognitive Status: Within Functional Limits for tasks assessed                                        General Comments      Exercises     Assessment/Plan    PT Assessment Patient needs continued PT services  PT Problem List Decreased strength;Decreased activity tolerance;Decreased balance;Decreased mobility       PT Treatment Interventions Balance training;DME instruction;Gait training;Stair training;Functional mobility training;Therapeutic activities;Therapeutic exercise;Patient/family education    PT Goals (Current goals can be found in the Care Plan section)  Acute Rehab PT Goals Patient Stated Goal: return home with assistance PT Goal Formulation: With patient Time For Goal Achievement: 06/07/20 Potential to Achieve Goals: Good    Frequency Min 3X/week   Barriers to discharge        Co-evaluation PT/OT/SLP Co-Evaluation/Treatment: Yes Reason for Co-Treatment: Complexity of the patient's impairments (multi-system involvement);For patient/therapist safety;To address functional/ADL transfers PT goals addressed during session: Mobility/safety with mobility;Proper use of  DME;Balance OT goals addressed during session: ADL's and self-care;Proper use of Adaptive equipment and DME       AM-PAC PT "6 Clicks" Mobility  Outcome Measure Help needed turning from your back to your side while in a flat bed without using bedrails?: A Little Help needed moving from lying on your back to sitting on the side of a flat bed without using bedrails?: A Lot Help needed moving to and from a bed to a chair (including a wheelchair)?: A Lot Help needed standing up from a chair using your arms (e.g., wheelchair or bedside chair)?: A Lot Help needed to walk in hospital room?: A Lot Help needed climbing 3-5 steps with a railing? : Total 6 Click Score: 12    End of Session Equipment Utilized During Treatment: Oxygen Activity Tolerance: Patient tolerated treatment well;Patient limited by fatigue Patient left: in chair;with call bell/phone within reach Nurse Communication: Mobility status PT Visit Diagnosis: Unsteadiness on feet (R26.81);Other abnormalities of gait and mobility (R26.89);Muscle weakness (generalized) (M62.81)    Time: [id number] PT Time Calculation (min) (ACUTE ONLY): 28 min   Charges:   PT Evaluation $PT Eval Moderate Complexity: 1 Mod PT Treatments $Therapeutic Activity: 23-37 mins        9:34 AM, 05/17/20 Lonell Grandchild, MPT Physical Therapist with [facility] [phone] office [phone] mobile phone

## 2020-05-17 NOTE — Plan of Care (Signed)
  Problem: Acute Rehab OT Goals (only OT should resolve) Goal: Pt. Will Perform Grooming Flowsheets (Taken 05/17/2020 0917) Pt Will Perform Grooming:  with modified independence  sitting  standing Goal: Pt. Will Perform Upper Body Dressing Flowsheets (Taken 05/17/2020 0917) Pt Will Perform Upper Body Dressing:  with supervision  sitting Goal: Pt. Will Perform Lower Body Dressing Flowsheets (Taken 05/17/2020 0917) Pt Will Perform Lower Body Dressing:  with supervision  sitting/lateral leans  sit to/from stand Goal: Pt. Will Transfer To Toilet Flowsheets (Taken 05/17/2020 [phone]) Pt Will Transfer to Toilet:  with supervision  stand pivot transfer  bedside commode  ambulating  regular height toilet Goal: Pt. Will Perform Toileting-Clothing Manipulation Flowsheets (Taken 05/17/2020 0917) Pt Will Perform Toileting - Clothing Manipulation and hygiene:  with supervision  sitting/lateral leans  sit to/from stand Goal: Pt/Caregiver Will Perform Home Exercise Program Flowsheets (Taken 05/17/2020 [phone]) Pt/caregiver will Perform Home Exercise Program:  Increased strength  Both right and left upper extremity  Independently  With written HEP provided

## 2020-05-17 NOTE — Evaluation (Signed)
Occupational Therapy Evaluation Patient Details Name: Ann [name] MRN: [id number] DOB: Apr 13, 1946 Today's Date: 05/17/2020    History of Present Illness Acute GI bleeding. s/p colostomy on 05/16/20   Clinical Impression   Pt agreeable to OT/PT co-evaluation. Pt reports soreness/pain in abdominal region. Pt with generalized weakness s/p colostomy and is requiring increased assistance with ADL tasks. She is unable to complete tasks in standing due to pain and weakness, requires set-up for seated tasks. Discussed rehabilitation with pt who is refusing SNF at this time, reports there are 8 people in her household who are able to assist her. Recommend [place] OT services on discharge if family is able to provided necessary level of assistance. If family is unable to assist then recommend SNF.     Follow Up Recommendations  Home health OT;SNF    Equipment Recommendations  None recommended by OT       Precautions / Restrictions Precautions Precautions: Fall Restrictions Weight Bearing Restrictions: No      Mobility Bed Mobility               General bed mobility comments: Defer to PT note    Transfers                 General transfer comment: Defer to PT note        ADL either performed or assessed with clinical judgement   ADL Overall ADL's : Needs assistance/impaired Eating/Feeding: Modified independent;Sitting   Grooming: Wash/dry hands;Wash/dry face;Set up;Sitting Grooming Details (indicate cue type and reason): pt provided with items while seated in chair, able to perform tasks without difficulty Upper Body Bathing: Minimal assistance;Sitting Upper Body Bathing Details (indicate cue type and reason): assist for back Lower Body Bathing: Maximal assistance;Sitting/lateral leans;Sit to/from stand Lower Body Bathing Details (indicate cue type and reason): pt with difficulty bending at waist s/p colostomy, requiring increased assistance for LB bathing Upper Body  Dressing : Minimal assistance;Sitting   Lower Body Dressing: Maximal assistance;Sitting/lateral leans;Sit to/from stand   Toilet Transfer: Moderate assistance;Stand-pivot;BSC;RW Toilet Transfer Details (indicate cue type and reason): simulated with bed to chair transfer [place] and Hygiene: Moderate assistance;Sitting/lateral lean;Sit to/from stand         General ADL Comments: Pt requiring increased assistance for ADLs due to weakness and pain post colostomy     Vision Baseline Vision/History: No visual deficits Patient Visual Report: No change from baseline Vision Assessment?: No apparent visual deficits            Pertinent Vitals/Pain Pain Assessment: 0-10 Pain Score: 6  Pain Location: stomach Pain Descriptors / Indicators: Sore Pain Intervention(s): Limited activity within patient's tolerance;Monitored during session;Repositioned     Hand Dominance Right   Extremity/Trunk Assessment Upper Extremity Assessment Upper Extremity Assessment: Generalized weakness   Lower Extremity Assessment Lower Extremity Assessment: Defer to PT evaluation       Communication Communication Communication: No difficulties   Cognition Arousal/Alertness: Awake/alert Behavior During Therapy: WFL for tasks assessed/performed Overall Cognitive Status: Within Functional Limits for tasks assessed                                                Home Living Family/patient expects to be discharged to:: Private residence Living Arrangements: Spouse/significant other;Children;Other relatives (8 people live in her house) Available Help at Discharge: Family;Available 24 hours/day Type of Home: [place]  Access: Ramped entrance     Home Layout: Two level;Able to live on main level with bedroom/bathroom     Bathroom Shower/Tub: Occupational psychologist: Standard     Home Equipment: Environmental consultant - 4 wheels;Bedside commode;Shower seat;Grab  bars - toilet;Grab bars - tub/shower;Wheelchair - manual;Hospital bed          Prior Functioning/Environment Level of Independence: Needs assistance  Gait / Transfers Assistance Needed: Pt use RW at home for mobility ADL's / Homemaking Assistance Needed: Pt reports husband supervises for safety but she is able to complete basic ADLs independently. Family assists with meal preparation, housekeeping            OT Problem List: Decreased strength;Decreased activity tolerance;Impaired balance (sitting and/or standing);Decreased safety awareness;Decreased knowledge of use of DME or AE;Pain      OT Treatment/Interventions: Self-care/ADL training;Therapeutic exercise;DME and/or AE instruction;Therapeutic activities;Patient/family education    OT Goals(Current goals can be found in the care plan section) Acute Rehab OT Goals Patient Stated Goal: to go home OT Goal Formulation: With patient Time For Goal Achievement: 05/31/20 Potential to Achieve Goals: Good  OT Frequency: Min 2X/week           Co-evaluation PT/OT/SLP Co-Evaluation/Treatment: Yes Reason for Co-Treatment: Complexity of the patient's impairments (multi-system involvement);To address functional/ADL transfers   OT goals addressed during session: ADL's and self-care;Proper use of Adaptive equipment and DME         End of Session Equipment Utilized During Treatment: Rolling walker;Oxygen Nurse Communication: Mobility status  Activity Tolerance: Patient tolerated treatment well Patient left: in chair;with call bell/phone within reach;with chair alarm set  OT Visit Diagnosis: Muscle weakness (generalized) (M62.81);Pain Pain - Right/Left:  (generalized) Pain - part of body:  (abdomen)                Time: 7824-2353 OT Time Calculation (min): 23 min Charges:  OT General Charges $OT Visit: 1 Visit OT Evaluation $OT Eval Low Complexity: 1 Low   Guadelupe Sabin, OTR/L  [phone] 05/17/2020, 9:14 AM

## 2020-05-17 NOTE — Progress Notes (Signed)
Patient currently being seen by surgery, underwent surgical procedure yesterday for GI bleed due to perforated diverticulitis. We will sign off for now, please contact us If further assistance is needed.   Thank you for allowing [place] to participate in the care of [place], DNP, AGNP-C Adult & Gerontological Nurse Practitioner [facility]

## 2020-05-17 NOTE — Progress Notes (Signed)
[facility] Progress Note  1 Day Post-Op  Subjective: Patient more appropriate and oriented this AM. Says she is sore but otherwise improved pain in the lower abdomen. Stoma is dusky and investigated. It is bleeding but congestion is leading to some areas that are concerning for mucosal ischemia.   Objective: Vital signs in last 24 hours: Temp:  [96.3 F (35.7 C)-98.4 F (36.9 C)] 98.4 F (36.9 C) (12/22 1119) Pulse Rate:  [32-167] 103 (12/22 1119) Resp:  [10-23] 18 (12/22 1119) BP: (89-134)/(54-93) 134/56 (12/22 0505) SpO2:  [89 %-99 %] 96 % (12/22 1119) Weight:  [127.2 kg] 127.2 kg (12/22 0448) Last BM Date: 05/15/20  Intake/Output from previous day: 12/21 0701 - 12/22 0700 In: 2372 [I.V.:2250; IV Piggyback:122] Out: 736 [Urine:635; Emesis/NG output:1; Blood:100] Intake/Output this shift: No intake/output data recorded.  General appearance: alert, cooperative and no distress Resp: normal work of breathing GI: abdomen soft, ostomy with dusky edematous appearance and some patchy areas of darker that could ischemia, bleeding is present, no pinlight available and unable to reallly see deep into lab tube, ostomy patent when digitalized  Midline with wound vac in place     Lab Results:  Recent Labs    05/16/20 1241 05/17/20 0442  WBC 20.4* 13.6*  HGB 9.8* 8.8*  HCT 34.0* 31.5*  PLT 227 184   BMET Recent Labs    05/16/20 0444 05/17/20 0442  NA 140 140  K 3.6 4.2  CL 103 105  CO2 28 26  GLUCOSE 138* 141*  BUN 25* 29*  CREATININE 1.05* 1.05*  CALCIUM 7.6* 7.9*   PT/INR No results for input(s): LABPROT, INR in the last 72 hours.  Studies/Results: No results found.  Anti-infectives: Anti-infectives (From admission, onward)   Start     Dose/Rate Route Frequency Ordered Stop   05/16/20 0600  cefoTEtan (CEFOTAN) 2 g in sodium chloride 0.9 % 100 mL IVPB        2 g 200 mL/hr over 30 Minutes Intravenous On call to O.R. 05/15/20 1039 05/16/20  0825   05/15/20 1000  piperacillin-tazobactam (ZOSYN) IVPB 3.375 g        3.375 g 12.5 mL/hr over 240 Minutes Intravenous Every 8 hours 05/15/20 0000     05/15/20 0100  piperacillin-tazobactam (ZOSYN) IVPB 3.375 g        3.375 g 12.5 mL/hr over 240 Minutes Intravenous  Once 05/14/20 2357 05/15/20 1322      Assessment/Plan: Ms. Reder is a 74 yo with end colostomy in place and concerns for dusky ostomy from congestion and possibly some degree of ischemia. Labs and hemodynamics all improved. Pain improved. She is oriented now.   Discussed ostomy congestion/ ischemia and possible need for revision with her and her son, Marc Morgans. Discussed that this is result of poor blood flow compromised by thickness of abdominal wall.  Will look at ostomy in AM and make further decisions. Clears ok now but NPO at midnight Labs in AM  Discussed with RN and Dr. Denton Brick.   LOS: 2 days    Virl Cagey 05/17/2020

## 2020-05-17 NOTE — Plan of Care (Signed)
  Problem: Acute Rehab PT Goals(only PT should resolve) Goal: Pt will Roll Supine to Side Outcome: Progressing Flowsheets (Taken 05/17/2020 0938) Pt will Roll Supine to Side: min guard Goal: Pt Will Go Supine/Side To Sit Outcome: Progressing Flowsheets (Taken 05/17/2020 0938) Pt will go Supine/Side to Sit: with minimal assist Goal: Pt Will Go Sit To Supine/Side Outcome: Progressing Flowsheets (Taken 05/17/2020 0938) Pt will go Sit to Supine/Side: with minimal assist Goal: Patient Will Transfer Sit To/From Stand Outcome: Progressing Flowsheets (Taken 05/17/2020 [phone]) Patient will transfer sit to/from stand: with minimal assist Goal: Pt Will Transfer Bed To Chair/Chair To Bed Outcome: Progressing Flowsheets (Taken 05/17/2020 0938) Pt will Transfer Bed to Chair/Chair to Bed: with min assist Goal: Pt Will Ambulate Outcome: Progressing Flowsheets (Taken 05/17/2020 0938) Pt will Ambulate:  25 feet  with minimal assist  with moderate assist  with rolling walker   9:39 AM, 05/17/20 Lonell Grandchild, MPT Physical Therapist with [facility] [phone] office [phone] mobile phone

## 2020-05-17 NOTE — Progress Notes (Signed)
PROGRESS NOTE        PATIENT DETAILS Name: Ann [name] Age: 74 y.o. Sex: female Date of Birth: 1945-06-18 Admit Date: 05/14/2020 Admitting Physician Evalee Mutton Kristeen Mans, MD QQP:[id number], Tesfaye, MD  Brief Narrative: Patient is a 74 y.o. female chronic diastolic heart failure, chronic atrial fibrillation, adrenal insufficiency, DM-2, COPD, CAD-s/p PCI-presented to the ED with 1 day history of left lower quadrant abdominal pain and hematochezia.  Further evaluation revealed sigmoid diverticulitis with perforation.  Hospital course complicated by acute blood loss anemia and hypotension-requiring transfer to the ICU on 12/20 for close monitoring.  Evaluated by general surgery-with plans to perform colectomy/colostomy on 12/21.  See below for further details.  Significant events: 12/19>> admit for sigmoid diverticulitis with perforation-hematochezia with acute blood loss anemia 12/20>> multiple episodes of hematochezia overnight-hypotension-transfer to ICU 12/21>>To OR-colectomy/colostomy  Significant studies: 12/19>> CT abdomen/pelvis: Sigmoid diverticulitis with contained perforation.  Antimicrobial therapy: Zosyn: 12/19>>  Microbiology data: 12/21>> intraoperative/intraperitoneal cultures: Pending  Procedures : 12/21>> partial colectomy with end colostomy-Dr Constance Haw  Consults: GI, general surgery  DVT Prophylaxis : SCDs Start: 05/14/20 1348 Place TED hose Start: 05/14/20 1348   Subjective: -Sitting up in a chair, had emesis overnight, nausea persist --Sanguinous drainage in ostomy bag  Assessment/Plan: Hypotension: Resolved multifactorial etiology-secondary to acute blood loss, sepsis-and relative adrenal insufficiency.  Improved after PRBC transfusion, IV fluid-patient and initialing of stress dose steroids. -  Sepsis due to sigmoid diverticulitis with contained perforation: Sepsis physiology has improved-underwent colectomy with colostomy  placement on 12/21-discussed with Dr. Eulis Manly antimicrobial therapy with Zosyn x5 additional days due to peritoneal contamination.  Await intraoperative cultures.  General surgery following. -WBC is down to 13.6 from 20.4 -05/17/20--ostomy look dusky and has some patchy areas of necrosis  -- will make NPO at midnight just in case patient needs revision of ostomy   Lower GI bleeding/Hematochezia with acute blood loss anemia: Unusual for diverticular bleeding in the setting of diverticulitis-May have underlying malignancy.  S/p 2 units of PRBC transfusion-hemoglobin stable.  GI/general surgery following-she is s/p colectomy-probably does not require further work-up.  --Hemoglobin currently 8.8  Follow surgical biopsies -Will need outpatient GI follow-up for endoluminal evaluation  Chronic atrial fibrillation with RVR: Rate slowly creeping up-likely due to pain at surgical site-Cardizem held on 12/20 due to hypotension-we will resume short acting Cardizem-once rate better controlled-we can switch her back to Cardizem.  Eliquis remains on hold-resume when okay with general surgery.   Chronic diastolic heart failure: Volume status stable-Lasix/other therapies on hold.  CAD-history of prior PCI: No anginal symptoms-not on aspirin as patient on anticoagulation.  COPD: Stable-continue bronchodilators.  AKI: Mild-likely hemodynamically mediated-with some element of contrast-induced nephropathy (received contrast for CT abdomen on admission). -Overall improved, creatinine is down to 1.0 from 1.46 a couple days ago - renally adjust medications, avoid nephrotoxic agents / dehydration  / hypotension  Relative adrenal insufficiency: On 10 mg of prednisone daily at home-n -see above regarding hypotension-now on stress dose hydrocortisone- will gradually taper down to usual prednisone dosing over the next few days.    History of vitamin B12 deficiency: Continue supplementation  Hypothyroidism  (TSH 1.22 on 8/12): On Synthroid  DM-2 (A1c 7.1 on 11/16): CBGs relatively stable-since patient n.p.o.-decrease detemir to 15 units daily-change SSI to every 4 hours.  Follow and adjust  Recent Labs    05/17/20 0335 05/17/20  [id number] 05/17/20 1118  GLUCAP 132* 131* 116*    Morbid Obesity: Estimated body mass index is 49.68 kg/m as calculated from the following:   Height as of this encounter: 5\' 3"  (1.6 m).   Weight as of this encounter: 127.2 kg.    Diet: Diet Order            Diet clear liquid Room service appropriate? Yes; Fluid consistency: Thin  Diet effective now                  Code Status: DNR  Family Communication: Spouse [name] [id number])- left voicemail on 05/17/20--  -General surgery has also spoken with family  Disposition Plan: Status [id number]  The patient will require care spanning > 2 midnights and should be moved to inpatient because: Hemodynamically unstable  Dispo: The patient is from: Home              Anticipated d/c is to: SNF              Anticipated d/c date is: > 3 days              Patient currently is not medically stable to d/c.  Barriers to Discharge: Hypotension/sepsis/acute blood loss anemia-due to perforated diverticulitis-s/p right colectomy/ostomy on 12/21  Antimicrobial agents: Anti-infectives (From admission, onward)   Start     Dose/Rate Route Frequency Ordered Stop   05/16/20 0600  cefoTEtan (CEFOTAN) 2 g in sodium chloride 0.9 % 100 mL IVPB        2 g 200 mL/hr over 30 Minutes Intravenous On call to O.R. 05/15/20 1039 05/16/20 0825   05/15/20 1000  piperacillin-tazobactam (ZOSYN) IVPB 3.375 g        3.375 g 12.5 mL/hr over 240 Minutes Intravenous Every 8 hours 05/15/20 0000     05/15/20 0100  piperacillin-tazobactam (ZOSYN) IVPB 3.375 g        3.375 g 12.5 mL/hr over 240 Minutes Intravenous  Once 05/14/20 2357 05/15/20 1322       Time spent: 35 minutes-Greater than 50% of this time was spent in counseling,  explanation of diagnosis, planning of further management, and coordination of care.  MEDICATIONS: Scheduled Meds:  atorvastatin  80 mg Oral q1800   Chlorhexidine Gluconate Cloth  6 each Topical Daily   cholecalciferol  5,000 Units Oral Daily   darifenacin  7.5 mg Oral Daily   diltiazem  30 mg Oral Q8H   gabapentin  400 mg Oral TID   hydrocortisone sod succinate (SOLU-CORTEF) inj  50 mg Intravenous Q8H   insulin aspart  0-9 Units Subcutaneous Q4H   insulin detemir  15 Units Subcutaneous Q2200   levothyroxine  150 mcg Oral QAC breakfast   mirabegron ER  50 mg Oral Daily   pantoprazole (PROTONIX) IV  40 mg Intravenous Q12H   sertraline  50 mg Oral QHS   sodium chloride flush  10-40 mL Intracatheter Q12H   sodium chloride flush  3 mL Intravenous Q12H   sodium chloride flush  3 mL Intravenous Q12H   umeclidinium bromide  1 puff Inhalation Daily   cyanocobalamin  1,000 mcg Oral Daily   Continuous Infusions:  sodium chloride     piperacillin-tazobactam (ZOSYN)  IV 3.375 g (05/17/20 0508)   sodium chloride Stopped (05/15/20 0945)   PRN Meds:.sodium chloride, acetaminophen **OR** acetaminophen, albuterol, metoprolol tartrate, morphine injection, nitroGLYCERIN, ondansetron **OR** ondansetron (ZOFRAN) IV, oxyCODONE, polyvinyl alcohol, sodium chloride, sodium chloride flush, sodium chloride flush, traZODone   PHYSICAL  EXAM: Vital signs: Vitals:   05/17/20 0505 05/17/20 0715 05/17/20 0721 05/17/20 1119  BP: (!) 134/56     Pulse: (!) 101 69  (!) 103  Resp: 18 19  18   Temp:  98 F (36.7 C)  98.4 F (36.9 C)  TempSrc:  Axillary  Oral  SpO2: 96% 99% 98% 96%  Weight:      Height:       Filed Weights   05/15/20 1651 05/16/20 0508 05/17/20 0448  Weight: 124.8 kg 125.1 kg 127.2 kg   Body mass index is 49.68 kg/m.   Phy Exam- Gen Exam:Alert awake-not in any distress HEENT:atraumatic, normocephalic Chest: B/L clear to auscultation anteriorly CVS:S1S2  irregular Abdomen: Soft-appropriately tender, +bs, ostomy look dusky and has some patchy areas of necrosis  , cleaned  Up by Dr Constance Haw and looks ok for now , -Ostomy bag with sanguinous drainage Extremities:no edema, good pedal pulses Neurology: Non focal Skin: no rash, warm and dry  LABORATORY DATA: CBC: Recent Labs  Lab 05/15/20 0859 05/15/20 2045 05/16/20 0444 05/16/20 1241 05/17/20 0442  WBC 19.2* 18.4* 18.6* 20.4* 13.6*  HGB 9.1* 8.9* 9.1* 9.8* 8.8*  HCT 31.3* 30.2* 31.2* 34.0* 31.5*  MCV 83.9 83.9 84.3 85.6 86.1  PLT 229 201 202 227 [id number]    Basic Metabolic Panel: Recent Labs  Lab 05/15/20 0345 05/15/20 0859 05/15/20 2045 05/16/20 0444 05/17/20 0442  NA 136 137 137 140 140  K 3.3* 3.6 4.0 3.6 4.2  CL 97* 99 101 103 105  CO2 28 27 25 28 26   GLUCOSE 125* 111* 143* 138* 141*  BUN 22 26* 27* 25* 29*  CREATININE 1.34* 1.46* 1.31* 1.05* 1.05*  CALCIUM 7.7* 7.5* 7.4* 7.6* 7.9*  MG  --   --  1.4*  --   --     GFR: Estimated Creatinine Clearance: 61.1 mL/min (A) (by C-G formula based on SCr of 1.05 mg/dL (H)).  Liver Function Tests: Recent Labs  Lab 05/14/20 1201 05/16/20 0444 05/17/20 0442  AST 20 20 34  ALT 14 13 16   ALKPHOS 54 49 46  BILITOT 0.5 0.6 0.5  PROT 7.0 6.2* 5.8*  ALBUMIN 3.7 3.0* 2.6*   No results for input(s): LIPASE, AMYLASE in the last 168 hours. No results for input(s): AMMONIA in the last 168 hours.  Coagulation Profile: Recent Labs  Lab 05/14/20 1246  INR 1.4*    Cardiac Enzymes: No results for input(s): CKTOTAL, CKMB, CKMBINDEX, TROPONINI in the last 168 hours.  BNP (last 3 results) No results for input(s): PROBNP in the last 8760 hours.  Lipid Profile: No results for input(s): CHOL, HDL, LDLCALC, TRIG, CHOLHDL, LDLDIRECT in the last 72 hours.  Thyroid Function Tests: No results for input(s): TSH, T4TOTAL, FREET4, T3FREE, THYROIDAB in the last 72 hours.  Anemia Panel: Recent Labs    05/15/20 0345  VITAMINB12 2,550*   FOLATE 8.3    Urine analysis:    Component Value Date/Time   COLORURINE YELLOW 01/23/2020 1819   APPEARANCEUR CLEAR 01/23/2020 1819   LABSPEC 1.012 01/23/2020 1819   PHURINE 5.0 01/23/2020 1819   GLUCOSEU NEGATIVE 01/23/2020 1819   HGBUR NEGATIVE 01/23/2020 1819   BILIRUBINUR NEGATIVE 01/23/2020 1819   KETONESUR NEGATIVE 01/23/2020 1819   PROTEINUR NEGATIVE 01/23/2020 1819   UROBILINOGEN 0.2 03/15/2013 1504   NITRITE NEGATIVE 01/23/2020 1819   LEUKOCYTESUR NEGATIVE 01/23/2020 1819    Sepsis Labs: Lactic Acid, Venous    Component Value Date/Time   LATICACIDVEN 1.2 05/15/2020 0345  MICROBIOLOGY: Recent Results (from the past 240 hour(s))  Resp Panel by RT-PCR (Flu A&B, Covid) Nasopharyngeal Swab     Status: None   Collection Time: 05/14/20 12:05 PM   Specimen: Nasopharyngeal Swab; Nasopharyngeal(NP) swabs in vial transport medium  Result Value Ref Range Status   SARS Coronavirus 2 by RT PCR NEGATIVE NEGATIVE Final    Comment: (NOTE) SARS-CoV-2 target nucleic acids are NOT DETECTED.  The SARS-CoV-2 RNA is generally detectable in upper respiratory specimens during the acute phase of infection. The lowest concentration of SARS-CoV-2 viral copies this assay can detect is 138 copies/mL. A negative result does not preclude SARS-Cov-2 infection and should not be used as the sole basis for treatment or other patient management decisions. A negative result may occur with  improper specimen collection/handling, submission of specimen other than nasopharyngeal swab, presence of viral mutation(s) within the areas targeted by this assay, and inadequate number of viral copies(<138 copies/mL). A negative result must be combined with clinical observations, patient history, and epidemiological information. The expected result is Negative.  Fact Sheet for Patients:  EntrepreneurPulse.com.au  Fact Sheet for Healthcare Providers:   IncredibleEmployment.be  This test is no t yet approved or cleared by the [place] FDA and  has been authorized for detection and/or diagnosis of SARS-CoV-2 by FDA under an Emergency Use Authorization (EUA). This EUA will remain  in effect (meaning this test can be used) for the duration of the COVID-19 declaration under Section 564(b)(1) of the Act, 21 U.S.C.section 360bbb-3(b)(1), unless the authorization is terminated  or revoked sooner.       Influenza A by PCR NEGATIVE NEGATIVE Final   Influenza B by PCR NEGATIVE NEGATIVE Final    Comment: (NOTE) The Xpert Xpress SARS-CoV-2/FLU/RSV plus assay is intended as an aid in the diagnosis of influenza from Nasopharyngeal swab specimens and should not be used as a sole basis for treatment. Nasal washings and aspirates are unacceptable for Xpert Xpress SARS-CoV-2/FLU/RSV testing.  Fact Sheet for Patients: EntrepreneurPulse.com.au  Fact Sheet for Healthcare Providers: IncredibleEmployment.be  This test is not yet approved or cleared by the [place] FDA and has been authorized for detection and/or diagnosis of SARS-CoV-2 by FDA under an Emergency Use Authorization (EUA). This EUA will remain in effect (meaning this test can be used) for the duration of the COVID-19 declaration under Section 564(b)(1) of the Act, 21 U.S.C. section 360bbb-3(b)(1), unless the authorization is terminated or revoked.  Performed at [facility], [address], [place], [place] [address]   MRSA PCR Screening     Status: None   Collection Time: 05/15/20  8:51 AM   Specimen: Nasal Mucosa; Nasopharyngeal  Result Value Ref Range Status   MRSA by PCR NEGATIVE NEGATIVE Final    Comment:        The GeneXpert MRSA Assay (FDA approved for NASAL specimens only), is one component of a comprehensive MRSA colonization surveillance program. It is not intended to diagnose MRSA infection nor to guide  or monitor treatment for MRSA infections. Performed at [facility], [address], [place], [place] [address]   Culture, blood (routine x 2)     Status: None (Preliminary result)   Collection Time: 05/15/20 10:26 AM   Specimen: BLOOD RIGHT HAND  Result Value Ref Range Status   Specimen Description BLOOD RIGHT HAND  Final   Special Requests   Final    BOTTLES DRAWN AEROBIC AND ANAEROBIC Blood Culture results may not be optimal due to an inadequate volume of  blood received in culture bottles   Culture   Final    NO GROWTH 2 DAYS Performed at [facility], [address], [place], [place] [address]    Report Status PENDING  Incomplete  Culture, blood (routine x 2)     Status: None (Preliminary result)   Collection Time: 05/15/20 10:31 AM   Specimen: BLOOD LEFT HAND  Result Value Ref Range Status   Specimen Description BLOOD LEFT HAND  Final   Special Requests   Final    BOTTLES DRAWN AEROBIC AND ANAEROBIC Blood Culture adequate volume   Culture   Final    NO GROWTH 2 DAYS Performed at [facility], [address], [place], [place] [address]    Report Status PENDING  Incomplete  Aerobic/Anaerobic Culture (surgical/deep wound)     Status: None (Preliminary result)   Collection Time: 05/16/20 10:09 AM   Specimen: PATH Cytology Peritoneal fluid; Body Fluid  Result Value Ref Range Status   Specimen Description   Final    PERITONEAL Performed at [facility], [address] [place], [place] [address]    Special Requests   Final    NONE Performed at [place] [place], [place] [address]    Gram Stain   Final    FEW WBC PRESENT,BOTH PMN AND MONONUCLEAR RARE GRAM VARIABLE ROD    Culture   Final    NO GROWTH < 24 HOURS Performed at [facility], [place] [address], [place], [place] [address]    Report Status PENDING  Incomplete    RADIOLOGY STUDIES/RESULTS: DG CHEST PORT 1 VIEW  Result Date: 05/15/2020 CLINICAL DATA:  PICC line placement EXAM: PORTABLE  CHEST 1 VIEW COMPARISON:  Portable exam 1145 hours compared 01/23/2020 FINDINGS: LEFT arm PICC line tip projects over SVC. Enlargement of cardiac silhouette. Atherosclerotic calcification aorta. Pulmonary vascularity normal. Mediastinum appears prominent but this may be related to AP technique. Subsegmental atelectasis at mid LEFT lung and minimally RIGHT base. No infiltrate, pleural effusion or pneumothorax. Bones demineralized with BILATERAL glenohumeral degenerative changes and prior cervical fusion. IMPRESSION: Tip of LEFT arm PICC line projects over SVC. Enlargement of cardiac silhouette. Mild scattered atelectasis. Electronically Signed   By: Lavonia Dana M.D.   On: 05/15/2020 11:56     LOS: 2 days   Roxan Hockey, MD  [facility]    To contact the attending provider between 7A-7P or the covering provider during after hours 7P-7A, please log into the web site [email or web address] and access using universal [place] password for that web site. If you do not have the password, please call the hospital operator.  05/17/2020, 11:49 AM

## 2020-05-18 LAB — GLUCOSE, CAPILLARY
Glucose-Capillary: 117 mg/dL — ABNORMAL HIGH (ref 70–99)
Glucose-Capillary: 134 mg/dL — ABNORMAL HIGH (ref 70–99)
Glucose-Capillary: 187 mg/dL — ABNORMAL HIGH (ref 70–99)
Glucose-Capillary: 150 mg/dL — ABNORMAL HIGH (ref 70–99)
Glucose-Capillary: 123 mg/dL — ABNORMAL HIGH (ref 70–99)

## 2020-05-18 LAB — TYPE AND SCREEN
ABO/RH(D): O POS
Antibody Screen: NEGATIVE
Unit division: 0
Unit division: 0
Unit division: 0
Unit division: 0

## 2020-05-18 LAB — COMPREHENSIVE METABOLIC PANEL
ALT: 17 U/L (ref 0–44)
AST: 35 U/L (ref 15–41)
Albumin: 2.5 g/dL — ABNORMAL LOW (ref 3.5–5.0)
Alkaline Phosphatase: 39 U/L (ref 38–126)
Anion gap: 11 (ref 5–15)
BUN: 18 mg/dL (ref 8–23)
CO2: 24 mmol/L (ref 22–32)
Calcium: 8.1 mg/dL — ABNORMAL LOW (ref 8.9–10.3)
Chloride: 103 mmol/L (ref 98–111)
Creatinine, Ser: 0.68 mg/dL (ref 0.44–1.00)
GFR, Estimated: >60 mL/min (ref >60)
Glucose, Bld: 106 mg/dL — ABNORMAL HIGH (ref 70–99)
Potassium: 3.7 mmol/L (ref 3.5–5.1)
Sodium: 138 mmol/L (ref 135–145)
Total Bilirubin: 0.5 mg/dL (ref 0.3–1.2)
Total Protein: 5.2 g/dL — ABNORMAL LOW (ref 6.5–8.1)

## 2020-05-18 LAB — BPAM RBC
Blood Product Expiration Date: 202201152359
Blood Product Expiration Date: 202201182359
Blood Product Expiration Date: 202201212359
Blood Product Expiration Date: 202201232359
ISSUE DATE / TIME: 202112191335
ISSUE DATE / TIME: 202112192154
Unit Type and Rh: 5100
Unit Type and Rh: 5100
Unit Type and Rh: 5100
Unit Type and Rh: 5100

## 2020-05-18 LAB — CBC
HCT: 25 % — ABNORMAL LOW (ref 36.0–46.0)
Hemoglobin: 7.1 g/dL — ABNORMAL LOW (ref 12.0–15.0)
MCH: 24.7 pg — ABNORMAL LOW (ref 26.0–34.0)
MCHC: 28.4 g/dL — ABNORMAL LOW (ref 30.0–36.0)
MCV: 87.1 fL (ref 80.0–100.0)
Platelets: 165 10*3/uL (ref 150–400)
RBC: 2.87 MIL/uL — ABNORMAL LOW (ref 3.87–5.11)
RDW: 18.1 % — ABNORMAL HIGH (ref 11.5–15.5)
WBC: 9.1 10*3/uL (ref 4.0–10.5)
nRBC: 0.2 % (ref 0.0–0.2)

## 2020-05-18 LAB — PREPARE RBC (CROSSMATCH)

## 2020-05-18 LAB — SURGICAL PATHOLOGY

## 2020-05-18 LAB — MAGNESIUM: Magnesium: 1.8 mg/dL (ref 1.7–2.4)

## 2020-05-18 MED ORDER — HYDROCORTISONE NA SUCCINATE PF 100 MG IJ SOLR
50.0000 mg | Freq: Two times a day (BID) | INTRAMUSCULAR | Status: DC
  Administered 2020-05-19 – 2020-05-20 (×3): 50 mg via INTRAVENOUS
  Filled 2020-05-18 (×3): qty 2

## 2020-05-18 MED ORDER — FUROSEMIDE 10 MG/ML IJ SOLN
20.0000 mg | Freq: Once | INTRAMUSCULAR | Status: AC
  Administered 2020-05-18: 10:00:00 20 mg via INTRAVENOUS
  Filled 2020-05-18: qty 2

## 2020-05-18 MED ORDER — SODIUM CHLORIDE 0.9% IV SOLUTION: Freq: Once | INTRAVENOUS | Status: AC

## 2020-05-18 MED ORDER — ALBUMIN HUMAN 25 % IV SOLN
25.0000 g | Freq: Once | INTRAVENOUS | Status: AC
  Administered 2020-05-18: 01:00:00 25 g via INTRAVENOUS
  Filled 2020-05-18: qty 100

## 2020-05-18 MED ORDER — SODIUM CHLORIDE 0.9 % IV SOLN
8.0000 mg | Freq: Once | INTRAVENOUS | Status: AC
  Administered 2020-05-18: 16:00:00 8 mg via INTRAVENOUS
  Filled 2020-05-18: qty 4

## 2020-05-18 MED ORDER — LACTATED RINGERS IV BOLUS
250.0000 mL | Freq: Once | INTRAVENOUS | Status: AC
  Administered 2020-05-18: 01:00:00 250 mL via INTRAVENOUS

## 2020-05-18 NOTE — Progress Notes (Signed)
MD made aware of stoma color and drainage at 22:00 05/17/20. No new orders received

## 2020-05-18 NOTE — Progress Notes (Signed)
[facility] Progress Note  2 Days Post-Op  Subjective: Says she is sore. H&H drifting down. Leukocytosis resolving. Bloody output from ostomy. Had discussed patient with RN last night and ostomy about the same at that time.   Objective: Vital signs in last 24 hours: Temp:  [97.7 F (36.5 C)-98.4 F (36.9 C)] 97.7 F (36.5 C) (12/23 0729) Pulse Rate:  [28-121] 91 (12/23 0729) Resp:  [3-25] 22 (12/23 0729) BP: (74-151)/(38-93) 89/53 (12/23 0130) SpO2:  [89 %-100 %] 98 % (12/23 0729) Weight:  [127.2 kg] 127.2 kg (12/23 0500) Last BM Date: 05/14/20  Intake/Output from previous day: 12/22 0701 - 12/23 0700 In: 646.2 [I.V.:492.5; IV Piggyback:153.7] Out: 1550 [Urine:1550] Intake/Output this shift: No intake/output data recorded.  General appearance: alert, cooperative and no distress Resp: normal work of breathing GI: soft, tender, wound vac in place and suctioning, ostomy edematous and dusky, able to transiluminate with light today, test tube inserted and mucosa is pink with better lighting Minor bloody fluid in bag      Lab Results:  Recent Labs    05/17/20 0442 05/18/20 0438  WBC 13.6* 9.1  HGB 8.8* 7.1*  HCT 31.5* 25.0*  PLT 184 165   BMET Recent Labs    05/17/20 0442 05/18/20 0438  NA 140 138  K 4.2 3.7  CL 105 103  CO2 26 24  GLUCOSE 141* 106*  BUN 29* 18  CREATININE 1.05* 0.68  CALCIUM 7.9* 8.1*   Anti-infectives: Anti-infectives (From admission, onward)   Start     Dose/Rate Route Frequency Ordered Stop   05/16/20 0600  cefoTEtan (CEFOTAN) 2 g in sodium chloride 0.9 % 100 mL IVPB        2 g 200 mL/hr over 30 Minutes Intravenous On call to O.R. 05/15/20 1039 05/16/20 0825   05/15/20 1000  piperacillin-tazobactam (ZOSYN) IVPB 3.375 g        3.375 g 12.5 mL/hr over 240 Minutes Intravenous Every 8 hours 05/15/20 0000     05/15/20 0100  piperacillin-tazobactam (ZOSYN) IVPB 3.375 g        3.375 g 12.5 mL/hr over 240 Minutes  Intravenous  Once 05/14/20 2357 05/15/20 1322      Assessment/Plan: Ms. Heckert is a 74 yo with perforated diverticulitis and end colostomy in place. Colostomy is dusky but transiluminates which is good as this means it is getting blood and is just more congested than anything. Deeper ostomy with test tube mucosa is pink.   PRN for pain IS, OOB to chair HD with A fib and BP drifting, getting blood which should help Clear diet ok, monitor ostomy Output Ostomy RN teaching family ostomy care today Wound vac change by Ostomy RN too, plan for TThS schedule and RN to change simple vac to midline on Saturday  Leukocytosis resolving, Zosyn for 5 days post op per Stop IT trial H&H drifting, acute post operative blood loss anemia related to Eliquis  SCDs, holding Eliquis with bleeding Updated [name] by phone.  Discussed with Dr. Denton Brick, RN and Ostomy RN.     LOS: 3 days    Virl Cagey 05/18/2020

## 2020-05-18 NOTE — Progress Notes (Signed)
CRITICAL VALUE ALERT  Critical Value:  Notified MD of pt MAP less than 65  Date & Time Notied:  05/18/20 & 00:35  Provider Notified: Dr. Randol Kern  Orders Received/Actions taken: see physician orders

## 2020-05-18 NOTE — Consult Note (Signed)
[facility] Nurse Consult Note: Reason for Consult: NPWT dressing change Wound type: surgical  Pressure Injury POA: Yes Measurement: 15cm x 7cm x 7.5cm Wound QAE:[id number] tissue; red; moist Drainage (amount, consistency, odor) serosanguinous  Periwound: intact  Dressing procedure/placement/frequency: Removed old NPWT dressing Filled wound with  _1__ piece of black foam Sealed NPWT dressing at 142m HG Patient received PO pain medication per bedside nurse prior to dressing change Patient tolerated procedure well    WOC nurse will continue to provide NPWT dressing change with ostomy pouch change  WOC Nurse ostomy consult note Stoma type/location: LLQ, end colostomy  Stomal assessment/size: 1 3/4" slightly budded, red, slightly dusky (surgeon is following closely) Peristomal assessment: intact  Treatment options for stomal/peristomal skin: 2" skin barrier Output none Ostomy pouching:  2pc. 2 3/4" 2" skin barrier Education provided:  Met with patient, patient's husband and son (former CNA) Explained role of oEducation officer, museumand creation of stoma  Explained stoma characteristics (budded, flush, color, texture, care) Demonstrated pouch change (cutting new skin barrier, measuring stoma, cleaning peristomal skin and stoma, use of barrier ring) Education on emptying when 1/3 to 1/2 full and how to empty Demonstrated use of wick to clean spout  Educational materials left in the room  [place] Yes 5 pouching systems and 5 barrier ring ordered for room Notified Dr. BConstance Hawof teaching visit and dressing change  Updated orders for next NPWT dressing for Monday 05/22/20; to get [facility]W/[facility]  WOC Nurse will follow along with you for continued support with ostomy teaching and care and NPWT dressings Blakeley Margraf AF[facility] OceansideMSN, RN, [place]

## 2020-05-18 NOTE — Progress Notes (Signed)
Pharmacy Antibiotic Note  Today is day #4  of Zosyn therapy for this 74 yo female admitted for perforated diverticulitis with end colostomy in place. WBC count is WNL and patient is currently afebrile. Wound and blood cultures have shown no growth to date, although wound gram stain grew rare GNR.  Plan: Continue Zosyn 3.375g IV q8h (4-hr infusion) for a total of 5 days post-op  Pharmacy will continue to monitor renal function, cultures and patient progress.   Height: 5\' 3"  (160 cm) Weight: 127.2 kg (280 lb 6.8 oz) IBW/kg (Calculated) : 52.4  Temp (24hrs), Avg:98 F (36.7 C), Min:97.7 F (36.5 C), Max:98.4 F (36.9 C)  Recent Labs  Lab 05/15/20 0027 05/15/20 0345 05/15/20 0859 05/15/20 2045 05/16/20 0444 05/16/20 1241 05/17/20 0442 05/18/20 0438  WBC  --  19.5* 19.2* 18.4* 18.6* 20.4* 13.6* 9.1  CREATININE  --  1.34* 1.46* 1.31* 1.05*  --  1.05* 0.68  LATICACIDVEN 2.5* 1.2  --   --   --   --   --   --     Estimated Creatinine Clearance: 80.2 mL/min (by C-G formula based on SCr of 0.68 mg/dL).    Allergies  Allergen Reactions  . Ace Inhibitors Other (See Comments)    unknown  . Asa [Aspirin]     Causes bleeding  . Tape Other (See Comments)    Skin tearing, causes scars  . Niacin Rash  . Reglan [Metoclopramide] Anxiety    Antimicrobials this admission: Zosyn 12/20 >>       Microbiology results: 12/20 BCx2: NG x2 days 12/21 wound: NG <24h  (GS: rare GV rod)   Thank you for allowing pharmacy to be a part of this patient's care.  Despina Pole 05/18/2020 12:12 PM

## 2020-05-18 NOTE — Progress Notes (Signed)
Physical Therapy Treatment Patient Details Name: Ann [name] MRN: [id number] DOB: 01-30-1946 Today's Date: 05/18/2020    History of Present Illness Ann [name]  is a 74 y.o. female, s/p colostomy on 05/16/20 due to acute GI bleeding, who is a reformed smoker with past medical history relevant for CAD with prior stents, prior history of diverticulosis/diverticulitis, GERD and erosive gastritis, chronic atrial fibrillation on anticoagulation with Eliquis,  history of DVT , history of adrenal insufficiency,hypothyroidism, COPD, type 2 diabetes on insulin, and morbid obesity as well as hypertension presents to the ED with abdominal pain of about 24 hours duration it is generalized patient rated at about 9/10 associated with chills and nausea but no emesis    PT Comments    Patient demonstrates slightly increased BLE strength for completing sit to stands, transferring to chair, and limited to a few steps at bedside mostly due to fatigue and c/o nausea.  Patient tolerated sitting up in chair after therapy - nursing staff aware.  Patient will benefit from continued physical therapy in hospital and recommended venue below to increase strength, balance, endurance for safe ADLs and gait.   Follow Up Recommendations  SNF;Supervision for mobility/OOB;Supervision - Intermittent     Equipment Recommendations  None recommended by PT    Recommendations for Other Services       Precautions / Restrictions Precautions Precautions: Fall Restrictions Weight Bearing Restrictions: No    Mobility  Bed Mobility Overal bed mobility: Needs Assistance Bed Mobility: Supine to Sit     Supine to sit: Min assist;Mod assist     General bed mobility comments: increased time, labored movement  Transfers Overall transfer level: Needs assistance Equipment used: Rolling walker (2 wheeled) Transfers: Sit to/from [facility] Sit to Stand: Min assist;Mod assist Stand pivot transfers: Min  assist;Mod assist       General transfer comment: slightly increased BLE strength for completing sit to stands and transfers  Ambulation/Gait Ambulation/Gait assistance: Min assist;Mod assist Gait Distance (Feet): 5 Feet Assistive device: Rolling walker (2 wheeled) Gait Pattern/deviations: Decreased step length - right;Decreased step length - left;Decreased stride length Gait velocity: decreased   General Gait Details: limited to 5-6 slow labored steps at bedside due to generalized weakness and fatigue   Stairs             Wheelchair Mobility    Modified Rankin (Stroke Patients Only)       Balance Overall balance assessment: Needs assistance Sitting-balance support: Feet supported;No upper extremity supported Sitting balance-Leahy Scale: Fair Sitting balance - Comments: fair/good seated at EOB   Standing balance support: During functional activity;Bilateral upper extremity supported Standing balance-Leahy Scale: Fair Standing balance comment: using RW                            Cognition Arousal/Alertness: Awake/alert Behavior During Therapy: WFL for tasks assessed/performed Overall Cognitive Status: Within Functional Limits for tasks assessed                                        Exercises General Exercises - Lower Extremity Long Arc Quad: Seated;AROM;Strengthening;Both;10 reps Hip Flexion/Marching: Seated;AROM;Strengthening;Both;10 reps Toe Raises: Seated;AROM;Strengthening;Both;15 reps Heel Raises: Seated;AROM;Strengthening;Both;15 reps    General Comments        Pertinent Vitals/Pain Pain Assessment: 0-10 Pain Score: 9  Pain Location: stomach Pain Descriptors / Indicators: Sore Pain Intervention(s): Limited  activity within patient's tolerance;Monitored during session;Repositioned    Home Living                      Prior Function            PT Goals (current goals can now be found in the care plan  section) Acute Rehab PT Goals Patient Stated Goal: return home with assistance PT Goal Formulation: With patient Time For Goal Achievement: 06/07/20 Potential to Achieve Goals: Good Progress towards PT goals: Progressing toward goals    Frequency    Min 3X/week      PT Plan      Co-evaluation              AM-PAC PT "6 Clicks" Mobility   Outcome Measure  Help needed turning from your back to your side while in a flat bed without using bedrails?: A Little Help needed moving from lying on your back to sitting on the side of a flat bed without using bedrails?: A Lot Help needed moving to and from a bed to a chair (including a wheelchair)?: A Lot Help needed standing up from a chair using your arms (e.g., wheelchair or bedside chair)?: A Lot Help needed to walk in hospital room?: A Lot Help needed climbing 3-5 steps with a railing? : Total 6 Click Score: 12    End of Session Equipment Utilized During Treatment: Oxygen Activity Tolerance: Patient tolerated treatment well;Patient limited by fatigue Patient left: in chair;with call bell/phone within reach Nurse Communication: Mobility status PT Visit Diagnosis: Unsteadiness on feet (R26.81);Other abnormalities of gait and mobility (R26.89);Muscle weakness (generalized) (M62.81)     Time: [id number] PT Time Calculation (min) (ACUTE ONLY): 27 min  Charges:  $Therapeutic Exercise: 8-22 mins $Therapeutic Activity: 8-22 mins                     11:24 AM, 05/18/20 Lonell Grandchild, MPT Physical Therapist with [facility] [phone] office [phone] mobile phone

## 2020-05-18 NOTE — Progress Notes (Addendum)
PROGRESS NOTE        PATIENT DETAILS Name: Ann [name] Age: 74 y.o. Sex: female Date of Birth: 1945-10-13 Admit Date: 05/14/2020 Admitting Physician Ann Mutton Ann Mans, MD PFX:TKWIO, Tesfaye, MD  Brief Narrative: Patient is a 74 y.o. female chronic diastolic heart failure, chronic atrial fibrillation, adrenal insufficiency, DM-2, COPD, CAD-s/p PCI-presented to the ED with 1 day history of left lower quadrant abdominal pain and hematochezia.  Further evaluation revealed sigmoid diverticulitis with perforation.  Hospital course complicated by acute blood loss anemia and hypotension-requiring transfer to the ICU on 12/20 for close monitoring.  Evaluated by general surgery-with plans to perform colectomy/colostomy on 12/21.  See below for further details.  Significant events: 12/19>> admit for sigmoid diverticulitis with perforation-hematochezia with acute blood loss anemia 12/20>> multiple episodes of hematochezia overnight-hypotension-transfer to ICU 12/21>>To OR-colectomy/colostomy  Significant studies: 12/19>> CT abdomen/pelvis: Sigmoid diverticulitis with contained perforation.  Antimicrobial therapy: Zosyn: 12/19>>  Microbiology data: 12/21>> intraoperative/intraperitoneal cultures: Pending  Procedures : 12/21>> partial colectomy with end colostomy-Dr Constance Haw  Consults: GI, general surgery  DVT Prophylaxis : SCDs Start: 05/14/20 1348 Place TED hose Start: 05/14/20 1348   Subjective: -No further emesis  -complains of fatigue and malaise --Sanguinous drainage in ostomy bag  Assessment/Plan: Hypotension: Resolved multifactorial etiology-secondary to acute blood loss, sepsis-and relative adrenal insufficiency.  Improved after PRBC transfusion, IV fluid-patient and initialing of stress dose steroids. -Change hydrocortisone to 50 mg IV every 12 hours and slowly taper to oral regimen  Sepsis due to sigmoid diverticulitis with contained  perforation: Sepsis physiology has improved-underwent colectomy with colostomy placement on 12/21-discussed with Dr. Eulis Manly antimicrobial therapy with Zosyn x5 additional days due to peritoneal contamination.  Await intraoperative cultures.  General surgery following. -WBC is down to 9.1 from 20.4 -05/18/20--ostomy look less dusky, general surgeon recommends further observation, no need for ostomy revision at this time -Ostomy RN to teach family ostomy care  Lower GI bleeding/Hematochezia with acute blood loss anemia: Unusual for diverticular bleeding in the setting of diverticulitis-   S/p 2 units of PRBC transfusion-hemoglobin stable.  GI/general surgery following-she is s/p colectomy-probably does not require further work-up.  --Hemoglobin down to 7.1, will transfuse 1 unit of PRBC on 05/18/2020 due to borderline hypotension and worsening anemia--AB LA  Follow surgical biopsies -Will need outpatient GI follow-up for endoluminal evaluation  Chronic atrial fibrillation with RVR: Rate slowly creeping up-likely due to pain at surgical site-Cardizem held on 12/20 due to hypotension-we will resume short acting Cardizem-once rate better controlled-we can switch her back to Cardizem.  Eliquis remains on hold-resume when okay with general surgery.  -Concerns about ongoing bleeding-hemoglobin is drifting down, Eliquis remains on hold  Chronic diastolic heart failure: Volume status stable-Lasix/other therapies on hold.  CAD-history of prior PCI: No anginal symptoms-not on aspirin as patient on anticoagulation.  COPD: Stable-continue bronchodilators.  AKI: Mild-likely hemodynamically mediated-with some element of contrast-induced nephropathy (received contrast for CT abdomen on admission). -AKI has resolved creatinine is down to 0.68 from 1.46 a couple days ago - renally adjust medications, avoid nephrotoxic agents / dehydration  / hypotension  Relative adrenal insufficiency: On 10 mg of  prednisone daily at home-n -see above regarding hypotension-now on stress dose hydrocortisone- will gradually taper down to usual prednisone dosing over the next few days.   --Change hydrocortisone to 50 mg IV every 12 hours and slowly taper  to oral regimen  History of vitamin B12 deficiency: Continue supplementation  Hypothyroidism (TSH 1.22 on 8/12): On Synthroid  DM-2 (A1c 7.1 on 11/16): CBGs relatively stable-since patient n.p.o.-decrease detemir to 15 units daily-change SSI to every 4 hours.  Follow and adjust  Recent Labs    05/18/20 0728 05/18/20 1112 05/18/20 1633  GLUCAP 134* 187* 150*    Morbid Obesity: Estimated body mass index is 49.68 kg/m as calculated from the following:   Height as of this encounter: 5\' 3"  (1.6 m).   Weight as of this encounter: 127.2 kg.   --Generalized weakness and deconditioning--PT eval appreciated, recommends SNF rehab  Diet: Diet Order            Diet clear liquid Room service appropriate? Yes; Fluid consistency: Thin  Diet effective now                  Code Status: DNR  Family Communication: Spouse [name] [id number])- left voicemail on 05/17/20--  -General surgery has also spoken with family  Disposition Plan: Status [id number]  The patient will require care spanning > 2 midnights and should be moved to inpatient because: Hemodynamically unstable  Dispo: The patient is from: Home              Anticipated d/c is to: SNF              Anticipated d/c date is: > 3 days              Patient currently is not medically stable to d/c.  Barriers to Discharge: Hypotension/sepsis/acute blood loss anemia-due to perforated diverticulitis-s/p right colectomy/ostomy on 12/21  Antimicrobial agents: Anti-infectives (From admission, onward)   Start     Dose/Rate Route Frequency Ordered Stop   05/16/20 0600  cefoTEtan (CEFOTAN) 2 g in sodium chloride 0.9 % 100 mL IVPB        2 g 200 mL/hr over 30 Minutes Intravenous On call to O.R.  05/15/20 1039 05/16/20 0825   05/15/20 1000  piperacillin-tazobactam (ZOSYN) IVPB 3.375 g        3.375 g 12.5 mL/hr over 240 Minutes Intravenous Every 8 hours 05/15/20 0000     05/15/20 0100  piperacillin-tazobactam (ZOSYN) IVPB 3.375 g        3.375 g 12.5 mL/hr over 240 Minutes Intravenous  Once 05/14/20 2357 05/15/20 1322      MEDICATIONS: Scheduled Meds: . atorvastatin  80 mg Oral q1800  . Chlorhexidine Gluconate Cloth  6 each Topical Daily  . cholecalciferol  5,000 Units Oral Daily  . darifenacin  7.5 mg Oral Daily  . diltiazem  30 mg Oral Q8H  . gabapentin  400 mg Oral TID  . hydrocortisone sod succinate (SOLU-CORTEF) inj  50 mg Intravenous Q8H  . insulin aspart  0-9 Units Subcutaneous Q4H  . insulin detemir  15 Units Subcutaneous Q2200  . levothyroxine  150 mcg Oral QAC breakfast  . mirabegron ER  50 mg Oral Daily  . pantoprazole (PROTONIX) IV  40 mg Intravenous Q12H  . sertraline  50 mg Oral QHS  . sodium chloride flush  10-40 mL Intracatheter Q12H  . sodium chloride flush  3 mL Intravenous Q12H  . sodium chloride flush  3 mL Intravenous Q12H  . umeclidinium bromide  1 puff Inhalation Daily  . cyanocobalamin  1,000 mcg Oral Daily   Continuous Infusions: . sodium chloride    . lactated ringers 75 mL/hr at 05/18/20 [id number]  . piperacillin-tazobactam (ZOSYN)  IV  3.375 g (05/18/20 1450)  . sodium chloride Stopped (05/15/20 0945)   PRN Meds:.sodium chloride, acetaminophen **OR** acetaminophen, albuterol, metoprolol tartrate, nitroGLYCERIN, ondansetron **OR** ondansetron (ZOFRAN) IV, oxyCODONE, polyvinyl alcohol, sodium chloride, sodium chloride flush, sodium chloride flush, traZODone   PHYSICAL EXAM: Vital signs: Vitals:   05/18/20 1155 05/18/20 1200 05/18/20 1300 05/18/20 1634  BP: (!) 154/113 (!) 154/113 (!) 175/99   Pulse:  98 (!) 56   Resp:  (!) 24 (!) 25 20  Temp: 98.1 F (36.7 C)   98.1 F (36.7 C)  TempSrc: Oral   Oral  SpO2:  97% 92%   Weight:      Height:        Filed Weights   05/16/20 0508 05/17/20 0448 05/18/20 0500  Weight: 125.1 kg 127.2 kg 127.2 kg   Body mass index is 49.68 kg/m.   Phy Exam- Gen Exam:Alert awake-not in any distress HEENT:atraumatic, normocephalic Chest: B/L clear to auscultation anteriorly CVS:S1S2 irregular Abdomen: Soft-appropriately tender, +bs, -Ostomy bag with sanguinous drainage Extremities:no edema, good pedal pulses Neurology: Non focal Skin: no rash, warm and dry  LABORATORY DATA: CBC: Recent Labs  Lab 05/15/20 2045 05/16/20 0444 05/16/20 1241 05/17/20 0442 05/18/20 0438  WBC 18.4* 18.6* 20.4* 13.6* 9.1  HGB 8.9* 9.1* 9.8* 8.8* 7.1*  HCT 30.2* 31.2* 34.0* 31.5* 25.0*  MCV 83.9 84.3 85.6 86.1 87.1  PLT 201 202 227 184 [id number]    Basic Metabolic Panel: Recent Labs  Lab 05/15/20 0859 05/15/20 2045 05/16/20 0444 05/17/20 0442 05/18/20 0438  NA 137 137 140 140 138  K 3.6 4.0 3.6 4.2 3.7  CL 99 101 103 105 103  CO2 27 25 28 26 24   GLUCOSE 111* 143* 138* 141* 106*  BUN 26* 27* 25* 29* 18  CREATININE 1.46* 1.31* 1.05* 1.05* 0.68  CALCIUM 7.5* 7.4* 7.6* 7.9* 8.1*  MG  --  1.4*  --   --  1.8    GFR: Estimated Creatinine Clearance: 80.2 mL/min (by C-G formula based on SCr of 0.68 mg/dL).  Liver Function Tests: Recent Labs  Lab 05/14/20 1201 05/16/20 0444 05/17/20 0442 05/18/20 0438  AST 20 20 34 35  ALT 14 13 16 17   ALKPHOS 54 49 46 39  BILITOT 0.5 0.6 0.5 0.5  PROT 7.0 6.2* 5.8* 5.2*  ALBUMIN 3.7 3.0* 2.6* 2.5*   No results for input(s): LIPASE, AMYLASE in the last 168 hours. No results for input(s): AMMONIA in the last 168 hours.  Coagulation Profile: Recent Labs  Lab 05/14/20 1246  INR 1.4*    Cardiac Enzymes: No results for input(s): CKTOTAL, CKMB, CKMBINDEX, TROPONINI in the last 168 hours.  BNP (last 3 results) No results for input(s): PROBNP in the last 8760 hours.  Lipid Profile: No results for input(s): CHOL, HDL, LDLCALC, TRIG, CHOLHDL, LDLDIRECT in the  last 72 hours.  Thyroid Function Tests: No results for input(s): TSH, T4TOTAL, FREET4, T3FREE, THYROIDAB in the last 72 hours.  Anemia Panel: No results for input(s): VITAMINB12, FOLATE, FERRITIN, TIBC, IRON, RETICCTPCT in the last 72 hours.  Urine analysis:    Component Value Date/Time   COLORURINE YELLOW 01/23/2020 1819   APPEARANCEUR CLEAR 01/23/2020 1819   LABSPEC 1.012 01/23/2020 1819   PHURINE 5.0 01/23/2020 1819   GLUCOSEU NEGATIVE 01/23/2020 1819   HGBUR NEGATIVE 01/23/2020 1819   BILIRUBINUR NEGATIVE 01/23/2020 1819   KETONESUR NEGATIVE 01/23/2020 1819   PROTEINUR NEGATIVE 01/23/2020 1819   UROBILINOGEN 0.2 03/15/2013 1504   NITRITE NEGATIVE 01/23/2020 1819  LEUKOCYTESUR NEGATIVE 01/23/2020 1819    Sepsis Labs: Lactic Acid, Venous    Component Value Date/Time   LATICACIDVEN 1.2 05/15/2020 0345    MICROBIOLOGY: Recent Results (from the past 240 hour(s))  Resp Panel by RT-PCR (Flu A&B, Covid) Nasopharyngeal Swab     Status: None   Collection Time: 05/14/20 12:05 PM   Specimen: Nasopharyngeal Swab; Nasopharyngeal(NP) swabs in vial transport medium  Result Value Ref Range Status   SARS Coronavirus 2 by RT PCR NEGATIVE NEGATIVE Final    Comment: (NOTE) SARS-CoV-2 target nucleic acids are NOT DETECTED.  The SARS-CoV-2 RNA is generally detectable in upper respiratory specimens during the acute phase of infection. The lowest concentration of SARS-CoV-2 viral copies this assay can detect is 138 copies/mL. A negative result does not preclude SARS-Cov-2 infection and should not be used as the sole basis for treatment or other patient management decisions. A negative result may occur with  improper specimen collection/handling, submission of specimen other than nasopharyngeal swab, presence of viral mutation(s) within the areas targeted by this assay, and inadequate number of viral copies(<138 copies/mL). A negative result must be combined with clinical observations,  patient history, and epidemiological information. The expected result is Negative.  Fact Sheet for Patients:  EntrepreneurPulse.com.au  Fact Sheet for Healthcare Providers:  IncredibleEmployment.be  This test is no t yet approved or cleared by the [place] FDA and  has been authorized for detection and/or diagnosis of SARS-CoV-2 by FDA under an Emergency Use Authorization (EUA). This EUA will remain  in effect (meaning this test can be used) for the duration of the COVID-19 declaration under Section 564(b)(1) of the Act, 21 U.S.C.section 360bbb-3(b)(1), unless the authorization is terminated  or revoked sooner.       Influenza A by PCR NEGATIVE NEGATIVE Final   Influenza B by PCR NEGATIVE NEGATIVE Final    Comment: (NOTE) The Xpert Xpress SARS-CoV-2/FLU/RSV plus assay is intended as an aid in the diagnosis of influenza from Nasopharyngeal swab specimens and should not be used as a sole basis for treatment. Nasal washings and aspirates are unacceptable for Xpert Xpress SARS-CoV-2/FLU/RSV testing.  Fact Sheet for Patients: EntrepreneurPulse.com.au  Fact Sheet for Healthcare Providers: IncredibleEmployment.be  This test is not yet approved or cleared by the [place] FDA and has been authorized for detection and/or diagnosis of SARS-CoV-2 by FDA under an Emergency Use Authorization (EUA). This EUA will remain in effect (meaning this test can be used) for the duration of the COVID-19 declaration under Section 564(b)(1) of the Act, 21 U.S.C. section 360bbb-3(b)(1), unless the authorization is terminated or revoked.  Performed at [facility], [address] [place], [place] [address]   MRSA PCR Screening     Status: None   Collection Time: 05/15/20  8:51 AM   Specimen: Nasal Mucosa; Nasopharyngeal  Result Value Ref Range Status   MRSA by PCR NEGATIVE NEGATIVE Final    Comment:        The  GeneXpert MRSA Assay (FDA approved for NASAL specimens only), is one component of a comprehensive MRSA colonization surveillance program. It is not intended to diagnose MRSA infection nor to guide or monitor treatment for MRSA infections. Performed at [facility], [address], [place], [place] [address]   Culture, blood (routine x 2)     Status: None (Preliminary result)   Collection Time: 05/15/20 10:26 AM   Specimen: BLOOD RIGHT HAND  Result Value Ref Range Status   Specimen Description BLOOD RIGHT HAND  Final  Special Requests   Final    BOTTLES DRAWN AEROBIC AND ANAEROBIC Blood Culture results may not be optimal due to an inadequate volume of blood received in culture bottles   Culture   Final    NO GROWTH 3 DAYS Performed at [facility], [address], [place], [place] [address]    Report Status PENDING  Incomplete  Culture, blood (routine x 2)     Status: None (Preliminary result)   Collection Time: 05/15/20 10:31 AM   Specimen: BLOOD LEFT HAND  Result Value Ref Range Status   Specimen Description BLOOD LEFT HAND  Final   Special Requests   Final    BOTTLES DRAWN AEROBIC AND ANAEROBIC Blood Culture adequate volume   Culture   Final    NO GROWTH 3 DAYS Performed at [facility], [address] [place], [place] [address]    Report Status PENDING  Incomplete  Aerobic/Anaerobic Culture (surgical/deep wound)     Status: None (Preliminary result)   Collection Time: 05/16/20 10:09 AM   Specimen: PATH Cytology Peritoneal fluid; Body Fluid  Result Value Ref Range Status   Specimen Description   Final    PERITONEAL Performed at [facility], [address] [place], [place] [address]    Special Requests   Final    NONE Performed at [facility], [address] [place],  [address]    Gram Stain   Final    FEW WBC PRESENT,BOTH PMN AND MONONUCLEAR RARE GRAM VARIABLE ROD    Culture   Final    NO GROWTH 2 DAYS Performed at [facility], [place] [address] [place],  [address]    Report Status PENDING  Incomplete    RADIOLOGY STUDIES/RESULTS: No results found.   LOS: 3 days   Roxan Hockey, MD  [facility]    To contact the attending provider between 7A-7P or the covering provider during after hours 7P-7A, please log into the web site [email or web address] and access using universal [place] password for that web site. If you do not have the password, please call the hospital operator.  05/18/2020, 5:54 PM

## 2020-05-19 ENCOUNTER — Inpatient Hospital Stay (HOSPITAL_COMMUNITY): Payer: Medicare Other

## 2020-05-19 LAB — TYPE AND SCREEN
ABO/RH(D): O POS
Antibody Screen: NEGATIVE
Unit division: 0

## 2020-05-19 LAB — CBC
HCT: 32.5 % — ABNORMAL LOW (ref 36.0–46.0)
Hemoglobin: 9.5 g/dL — ABNORMAL LOW (ref 12.0–15.0)
MCH: 25.2 pg — ABNORMAL LOW (ref 26.0–34.0)
MCHC: 29.2 g/dL — ABNORMAL LOW (ref 30.0–36.0)
MCV: 86.2 fL (ref 80.0–100.0)
Platelets: 179 10*3/uL (ref 150–400)
RBC: 3.77 MIL/uL — ABNORMAL LOW (ref 3.87–5.11)
RDW: 17.2 % — ABNORMAL HIGH (ref 11.5–15.5)
WBC: 10 10*3/uL (ref 4.0–10.5)
nRBC: 0 % (ref 0.0–0.2)

## 2020-05-19 LAB — GLUCOSE, CAPILLARY
Glucose-Capillary: 108 mg/dL — ABNORMAL HIGH (ref 70–99)
Glucose-Capillary: 117 mg/dL — ABNORMAL HIGH (ref 70–99)
Glucose-Capillary: 130 mg/dL — ABNORMAL HIGH (ref 70–99)
Glucose-Capillary: 192 mg/dL — ABNORMAL HIGH (ref 70–99)
Glucose-Capillary: 137 mg/dL — ABNORMAL HIGH (ref 70–99)
Glucose-Capillary: 166 mg/dL — ABNORMAL HIGH (ref 70–99)

## 2020-05-19 LAB — BPAM RBC
Blood Product Expiration Date: 202201212359
ISSUE DATE / TIME: 202112231113
Unit Type and Rh: 5100

## 2020-05-19 LAB — BASIC METABOLIC PANEL
Anion gap: 9 (ref 5–15)
BUN: 13 mg/dL (ref 8–23)
CO2: 31 mmol/L (ref 22–32)
Calcium: 8.6 mg/dL — ABNORMAL LOW (ref 8.9–10.3)
Chloride: 100 mmol/L (ref 98–111)
Creatinine, Ser: 0.65 mg/dL (ref 0.44–1.00)
GFR, Estimated: >60 mL/min (ref >60)
Glucose, Bld: 114 mg/dL — ABNORMAL HIGH (ref 70–99)
Potassium: 3.1 mmol/L — ABNORMAL LOW (ref 3.5–5.1)
Sodium: 140 mmol/L (ref 135–145)

## 2020-05-19 MED ORDER — POTASSIUM CHLORIDE CRYS ER 20 MEQ PO TBCR
40.0000 meq | EXTENDED_RELEASE_TABLET | ORAL | Status: AC
  Administered 2020-05-19 (×2): 40 meq via ORAL
  Filled 2020-05-19 (×2): qty 2

## 2020-05-19 MED ORDER — POTASSIUM CHLORIDE 10 MEQ/100ML IV SOLN
10.0000 meq | INTRAVENOUS | Status: AC
Start: 2020-05-19 — End: 2020-05-19
  Administered 2020-05-19 (×4): 10 meq via INTRAVENOUS
  Filled 2020-05-19 (×4): qty 100

## 2020-05-19 NOTE — Progress Notes (Addendum)
PROGRESS NOTE        PATIENT DETAILS Name: Ann [name] Age: 74 y.o. Sex: female Date of Birth: 31-Jul-1945 Admit Date: 05/14/2020 Admitting Physician Evalee Mutton Kristeen Mans, MD [id number], Tesfaye, MD  Brief Narrative: Patient is a 74 y.o. female chronic diastolic heart failure, chronic atrial fibrillation, adrenal insufficiency, DM-2, COPD, CAD-s/p PCI-presented to the ED with 1 day history of left lower quadrant abdominal pain and hematochezia.  Further evaluation revealed sigmoid diverticulitis with perforation.  Hospital course complicated by acute blood loss anemia and hypotension-requiring transfer to the ICU on 12/20 for close monitoring.  Evaluated by general surgery-with plans to perform colectomy/colostomy on 12/21.  See below for further details.  Significant events: 12/19>> admit for sigmoid diverticulitis with perforation-hematochezia with acute blood loss anemia 12/20>> multiple episodes of hematochezia overnight-hypotension-transfer to ICU 12/21>>To OR-colectomy/colostomy  Significant studies: 12/19>> CT abdomen/pelvis: Sigmoid diverticulitis with contained perforation.  Antimicrobial therapy: Zosyn: 12/19>>  Microbiology data: 12/21>> intraoperative/intraperitoneal cultures: Pending  Procedures : 12/21>> partial colectomy with end colostomy-Dr Constance Haw  Consults: GI, general surgery  DVT Prophylaxis : SCDs Start: 05/14/20 1348 Place TED hose Start: 05/14/20 1348   Subjective: -Refusing soups and oral liquids -Patient is grumpy, unhappy, -Does not want to talk about rehab, does not want to drink -Reluctant to get out of bed even with assistance --Sanguinous drainage in ostomy bag  Assessment/Plan: Hypotension: Resolved multifactorial etiology-secondary to acute blood loss, sepsis-and relative adrenal insufficiency.  Improved after PRBC transfusion, IV fluid-patient and initialing of stress dose steroids. -Change hydrocortisone to 50  mg IV every 12 hours and slowly taper to oral regimen  Sepsis due to sigmoid diverticulitis with contained perforation: Sepsis physiology has improved-underwent colectomy with colostomy placement on 12/21-discussed with Dr. Constance Haw- --continue antimicrobial therapy with Zosyn through 05/19/2020 due to peritoneal contamination.   Await intraoperative cultures-NGTD -Leukocytosis-resolved -05/18/20--ostomy look less dusky, general surgeon recommends further observation, no need for ostomy revision at this time -Ostomy RN was teaching family ostomy care  Lower GI bleeding/Hematochezia with acute blood loss anemia: Unusual for diverticular bleeding in the setting of diverticulitis-   S/p 2 units of PRBC transfusion-hemoglobin stable.  GI/general surgery following-she is s/p colectomy-probably does not require further work-up.  --Hemoglobin up to 9.5 after a total of 3 units of PRBC this admission   Follow surgical biopsies -Will need outpatient GI follow-up for endoluminal evaluation  Chronic atrial fibrillation with RVR: Rate slowly creeping up-likely due to pain at surgical site-Cardizem held on 12/20 due to hypotension-we will resume short acting Cardizem-once rate better controlled-we can switch her back to Cardizem.  Eliquis remains on hold-resume when okay with general surgery.  -Concerns about ongoing bleeding- Eliquis remains on hold  Chronic diastolic heart failure: Volume status stable-Lasix/other therapies on hold.  CAD-history of prior PCI: No anginal symptoms-not on aspirin as patient on anticoagulation.  COPD: Stable-continue bronchodilators.  AKI: Mild-likely hemodynamically mediated-with some element of contrast-induced nephropathy (received contrast for CT abdomen on admission). -AKI has resolved creatinine is down to 0.68 from 1.46 a couple days ago - renally adjust medications, avoid nephrotoxic agents / dehydration  / hypotension  Relative adrenal insufficiency: On 10 mg of  prednisone daily at home-n -see above regarding hypotension-now on stress dose hydrocortisone- will gradually taper down to usual prednisone dosing over the next few days.   --Change hydrocortisone to 50 mg IV every 12  hours and slowly taper to oral regimen  History of vitamin B12 deficiency: Continue supplementation  Hypothyroidism (TSH 1.22 on 8/12): On Synthroid  DM-2 (A1c 7.1 on 11/16): CBGs relatively stable-since patient n.p.o.-decrease detemir to 15 units daily-change SSI to every 4 hours.  Follow and adjust  Recent Labs    05/19/20 0811 05/19/20 1334 05/19/20 1629  GLUCAP 130* 192* 137*    Morbid Obesity: Estimated body mass index is 49.68 kg/m as calculated from the following:   Height as of this encounter: 5\' 3"  (1.6 m).   Weight as of this encounter: 127.2 kg.   --Generalized weakness and deconditioning--PT eval appreciated, recommends SNF rehab, patient is reluctant to consider SNF rehab  Diet: Diet Order            Diet clear liquid Room service appropriate? Yes; Fluid consistency: Thin  Diet effective now                  Code Status: DNR  Family Communication: Spouse [name] [phone])-and son  Disposition Plan: Status [id number]  The patient will require care spanning > 2 midnights and should be moved to inpatient because: - Awaiting return of bowel function and tolerance of oral intake prior to discharge   Dispo: The patient is from: Home              Anticipated d/c is to: SNF Vs Home with [facility]              Anticipated d/c date is: > 3 days              Patient currently is not medically stable to d/c.  Barriers to Discharge:     Awaiting return of bowel function and tolerance of oral intake prior to discharge  Antimicrobial agents: Anti-infectives (From admission, onward)   Start     Dose/Rate Route Frequency Ordered Stop   05/16/20 0600  cefoTEtan (CEFOTAN) 2 g in sodium chloride 0.9 % 100 mL IVPB        2 g 200 mL/hr over 30 Minutes  Intravenous On call to O.R. 05/15/20 1039 05/16/20 0825   05/15/20 1000  piperacillin-tazobactam (ZOSYN) IVPB 3.375 g        3.375 g 12.5 mL/hr over 240 Minutes Intravenous Every 8 hours 05/15/20 0000     05/15/20 0100  piperacillin-tazobactam (ZOSYN) IVPB 3.375 g        3.375 g 12.5 mL/hr over 240 Minutes Intravenous  Once 05/14/20 2357 05/15/20 1322      MEDICATIONS: Scheduled Meds: . atorvastatin  80 mg Oral q1800  . Chlorhexidine Gluconate Cloth  6 each Topical Daily  . cholecalciferol  5,000 Units Oral Daily  . darifenacin  7.5 mg Oral Daily  . diltiazem  30 mg Oral Q8H  . gabapentin  400 mg Oral TID  . hydrocortisone sod succinate (SOLU-CORTEF) inj  50 mg Intravenous Q12H  . insulin aspart  0-9 Units Subcutaneous Q4H  . insulin detemir  15 Units Subcutaneous Q2200  . levothyroxine  150 mcg Oral QAC breakfast  . mirabegron ER  50 mg Oral Daily  . pantoprazole (PROTONIX) IV  40 mg Intravenous Q12H  . potassium chloride  40 mEq Oral Q3H  . sertraline  50 mg Oral QHS  . sodium chloride flush  10-40 mL Intracatheter Q12H  . sodium chloride flush  3 mL Intravenous Q12H  . sodium chloride flush  3 mL Intravenous Q12H  . umeclidinium bromide  1 puff Inhalation Daily  .  cyanocobalamin  1,000 mcg Oral Daily   Continuous Infusions: . sodium chloride    . lactated ringers 75 mL/hr at 05/19/20 1507  . piperacillin-tazobactam (ZOSYN)  IV 12.5 mL/hr at 05/19/20 1507  . sodium chloride Stopped (05/15/20 0945)   PRN Meds:.sodium chloride, acetaminophen **OR** acetaminophen, albuterol, metoprolol tartrate, nitroGLYCERIN, ondansetron **OR** ondansetron (ZOFRAN) IV, oxyCODONE, polyvinyl alcohol, sodium chloride, sodium chloride flush, sodium chloride flush, traZODone   PHYSICAL EXAM: Vital signs: Vitals:   05/19/20 1400 05/19/20 1500 05/19/20 1600 05/19/20 1700  BP: (!) 173/147 (!) 145/89 (!) 144/97 (!) 147/93  Pulse:   62 (!) 46  Resp: 18 16 17 15   Temp:   98.2 F (36.8 C)    TempSrc:      SpO2:   99% 100%  Weight:      Height:       Filed Weights   05/17/20 0448 05/18/20 0500 05/19/20 0500  Weight: 127.2 kg 127.2 kg 127.2 kg   Body mass index is 49.68 kg/m.   Phy Exam- Gen Exam:Alert awake-not in any distress HEENT:atraumatic, normocephalic Chest: B/L clear to auscultation anteriorly CVS:S1S2 irregular Abdomen: Soft-appropriately tender, +bs, -Ostomy bag with sanguinous drainage Extremities:no edema, good pedal pulses Neurology: Non focal Skin: no rash, warm and dry  LABORATORY DATA: CBC: Recent Labs  Lab 05/16/20 0444 05/16/20 1241 05/17/20 0442 05/18/20 0438 05/19/20 0421  WBC 18.6* 20.4* 13.6* 9.1 10.0  HGB 9.1* 9.8* 8.8* 7.1* 9.5*  HCT 31.2* 34.0* 31.5* 25.0* 32.5*  MCV 84.3 85.6 86.1 87.1 86.2  PLT 202 227 184 165 0000000    Basic Metabolic Panel: Recent Labs  Lab 05/15/20 2045 05/16/20 0444 05/17/20 0442 05/18/20 0438 05/19/20 0421  NA 137 140 140 138 140  K 4.0 3.6 4.2 3.7 3.1*  CL 101 103 105 103 100  CO2 25 28 26 24 31   GLUCOSE 143* 138* 141* 106* 114*  BUN 27* 25* 29* 18 13  CREATININE 1.31* 1.05* 1.05* 0.68 0.65  CALCIUM 7.4* 7.6* 7.9* 8.1* 8.6*  MG 1.4*  --   --  1.8  --     GFR: Estimated Creatinine Clearance: 80.2 mL/min (by C-G formula based on SCr of 0.65 mg/dL).  Liver Function Tests: Recent Labs  Lab 05/14/20 1201 05/16/20 0444 05/17/20 0442 05/18/20 0438  AST 20 20 34 35  ALT 14 13 16 17   ALKPHOS 54 49 46 39  BILITOT 0.5 0.6 0.5 0.5  PROT 7.0 6.2* 5.8* 5.2*  ALBUMIN 3.7 3.0* 2.6* 2.5*   No results for input(s): LIPASE, AMYLASE in the last 168 hours. No results for input(s): AMMONIA in the last 168 hours.  Coagulation Profile: Recent Labs  Lab 05/14/20 1246  INR 1.4*    Cardiac Enzymes: No results for input(s): CKTOTAL, CKMB, CKMBINDEX, TROPONINI in the last 168 hours.  BNP (last 3 results) No results for input(s): PROBNP in the last 8760 hours.  Lipid Profile: No results for  input(s): CHOL, HDL, LDLCALC, TRIG, CHOLHDL, LDLDIRECT in the last 72 hours.  Thyroid Function Tests: No results for input(s): TSH, T4TOTAL, FREET4, T3FREE, THYROIDAB in the last 72 hours.  Anemia Panel: No results for input(s): VITAMINB12, FOLATE, FERRITIN, TIBC, IRON, RETICCTPCT in the last 72 hours.  Urine analysis:    Component Value Date/Time   COLORURINE YELLOW 01/23/2020 1819   APPEARANCEUR CLEAR 01/23/2020 1819   LABSPEC 1.012 01/23/2020 1819   PHURINE 5.0 01/23/2020 1819   GLUCOSEU NEGATIVE 01/23/2020 1819   HGBUR NEGATIVE 01/23/2020 1819   BILIRUBINUR NEGATIVE  01/23/2020 [place] 01/23/2020 1819   PROTEINUR NEGATIVE 01/23/2020 1819   UROBILINOGEN 0.2 03/15/2013 1504   NITRITE NEGATIVE 01/23/2020 1819   LEUKOCYTESUR NEGATIVE 01/23/2020 1819    Sepsis Labs: Lactic Acid, Venous    Component Value Date/Time   LATICACIDVEN 1.2 05/15/2020 0345    MICROBIOLOGY: Recent Results (from the past 240 hour(s))  Resp Panel by RT-PCR (Flu A&B, Covid) Nasopharyngeal Swab     Status: None   Collection Time: 05/14/20 12:05 PM   Specimen: Nasopharyngeal Swab; Nasopharyngeal(NP) swabs in vial transport medium  Result Value Ref Range Status   SARS Coronavirus 2 by RT PCR NEGATIVE NEGATIVE Final    Comment: (NOTE) SARS-CoV-2 target nucleic acids are NOT DETECTED.  The SARS-CoV-2 RNA is generally detectable in upper respiratory specimens during the acute phase of infection. The lowest concentration of SARS-CoV-2 viral copies this assay can detect is 138 copies/mL. A negative result does not preclude SARS-Cov-2 infection and should not be used as the sole basis for treatment or other patient management decisions. A negative result may occur with  improper specimen collection/handling, submission of specimen other than nasopharyngeal swab, presence of viral mutation(s) within the areas targeted by this assay, and inadequate number of viral copies(<138 copies/mL). A  negative result must be combined with clinical observations, patient history, and epidemiological information. The expected result is Negative.  Fact Sheet for Patients:  EntrepreneurPulse.com.au  Fact Sheet for Healthcare Providers:  IncredibleEmployment.be  This test is no t yet approved or cleared by the [place] FDA and  has been authorized for detection and/or diagnosis of SARS-CoV-2 by FDA under an Emergency Use Authorization (EUA). This EUA will remain  in effect (meaning this test can be used) for the duration of the COVID-19 declaration under Section 564(b)(1) of the Act, 21 U.S.C.section 360bbb-3(b)(1), unless the authorization is terminated  or revoked sooner.       Influenza A by PCR NEGATIVE NEGATIVE Final   Influenza B by PCR NEGATIVE NEGATIVE Final    Comment: (NOTE) The Xpert Xpress SARS-CoV-2/FLU/RSV plus assay is intended as an aid in the diagnosis of influenza from Nasopharyngeal swab specimens and should not be used as a sole basis for treatment. Nasal washings and aspirates are unacceptable for Xpert Xpress SARS-CoV-2/FLU/RSV testing.  Fact Sheet for Patients: EntrepreneurPulse.com.au  Fact Sheet for Healthcare Providers: IncredibleEmployment.be  This test is not yet approved or cleared by the [place] FDA and has been authorized for detection and/or diagnosis of SARS-CoV-2 by FDA under an Emergency Use Authorization (EUA). This EUA will remain in effect (meaning this test can be used) for the duration of the COVID-19 declaration under Section 564(b)(1) of the Act, 21 U.S.C. section 360bbb-3(b)(1), unless the authorization is terminated or revoked.  Performed at [facility], [address], [place], [place] [address]   MRSA PCR Screening     Status: None   Collection Time: 05/15/20  8:51 AM   Specimen: Nasal Mucosa; Nasopharyngeal  Result Value Ref Range Status   MRSA  by PCR NEGATIVE NEGATIVE Final    Comment:        The GeneXpert MRSA Assay (FDA approved for NASAL specimens only), is one component of a comprehensive MRSA colonization surveillance program. It is not intended to diagnose MRSA infection nor to guide or monitor treatment for MRSA infections. Performed at [facility], [address] [place], [place] [address]   Culture, blood (routine x 2)     Status: None (Preliminary result)  Collection Time: 05/15/20 10:26 AM   Specimen: BLOOD RIGHT HAND  Result Value Ref Range Status   Specimen Description BLOOD RIGHT HAND  Final   Special Requests   Final    BOTTLES DRAWN AEROBIC AND ANAEROBIC Blood Culture results may not be optimal due to an inadequate volume of blood received in culture bottles   Culture   Final    NO GROWTH 4 DAYS Performed at [facility], [address], [place], [place] [address]    Report Status PENDING  Incomplete  Culture, blood (routine x 2)     Status: None (Preliminary result)   Collection Time: 05/15/20 10:31 AM   Specimen: BLOOD LEFT HAND  Result Value Ref Range Status   Specimen Description BLOOD LEFT HAND  Final   Special Requests   Final    BOTTLES DRAWN AEROBIC AND ANAEROBIC Blood Culture adequate volume   Culture   Final    NO GROWTH 4 DAYS Performed at [facility], [address] [place], [place] [address]    Report Status PENDING  Incomplete  Aerobic/Anaerobic Culture (surgical/deep wound)     Status: None (Preliminary result)   Collection Time: 05/16/20 10:09 AM   Specimen: PATH Cytology Peritoneal fluid; Body Fluid  Result Value Ref Range Status   Specimen Description   Final    PERITONEAL Performed at [facility], [address] [place], [place] [address]    Special Requests   Final    NONE Performed at [facility] [place], [place] [address]    Gram Stain   Final    FEW WBC PRESENT,BOTH PMN AND MONONUCLEAR RARE GRAM VARIABLE ROD    Culture   Final    NO GROWTH 3  DAYS NO ANAEROBES ISOLATED; CULTURE IN PROGRESS FOR 5 DAYS Performed at [facility], [place] [address], [place], [place] [address]    Report Status PENDING  Incomplete    RADIOLOGY STUDIES/RESULTS: DG ABD ACUTE 2+V W 1V CHEST  Result Date: 05/19/2020 CLINICAL DATA:  The reason for exam is stated as GI bleeding. Best obtainable images. Recent colostomy. EXAM: DG ABDOMEN ACUTE WITH 1 VIEW CHEST COMPARISON:  None. FINDINGS: Anterior cervical fusion. Normal cardiac silhouette. Low lung volumes. Bilateral streaky pulmonary opacities. Low lung volumes. Probable pleural effusions. No dilated large or small bowel.  Gas in the rectum. IMPRESSION: 1. No evidence of bowel obstruction. 2. Low lung volumes with streaky bilateral airspace opacities. Cannot exclude pneumonia. Probable effusions. Electronically Signed   By: Suzy Bouchard M.D.   On: 05/19/2020 08:21    LOS: 4 days   Roxan Hockey, MD  [facility]  To contact the attending provider between 7A-7P or the covering provider during after hours 7P-7A, please log into the web site [email or web address] and access using universal [place] password for that web site. If you do not have the password, please call the hospital operator.  05/19/2020, 5:33 PM
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
## 2020-05-19 NOTE — Progress Notes (Signed)
[facility] Progress Note  3 Days Post-Op  Subjective: Vomiting described by RN as patient retching and dry heaving and having small bilious emesis. Xray this AM without ileus or obstruction. Gas and liquid stool in bag.  Vac changed yesterday and ostomy wafer changed.   Objective: Vital signs in last 24 hours: Temp:  [97.3 F (36.3 C)-98.1 F (36.7 C)] 97.4 F (36.3 C) (12/24 0400) Pulse Rate:  [38-129] 70 (12/24 0800) Resp:  [8-29] 8 (12/24 0800) BP: (125-194)/(74-118) 168/99 (12/24 0800) SpO2:  [89 %-100 %] 100 % (12/24 0806) Weight:  [127.2 kg] 127.2 kg (12/24 0500) Last BM Date: 05/14/20  Intake/Output from previous day: 12/23 0701 - 12/24 0700 In: 315 [Blood:315] Out: 2800 [Urine:2800] Intake/Output this shift: No intake/output data recorded.  General appearance: alert, cooperative and no distress Resp: normal work of breathing GI: soft, obese, wound vac in place and ostomy with gas and liquid stool, ostomy remains dusky but improved  Lab Results:  Recent Labs    05/18/20 0438 05/19/20 0421  WBC 9.1 10.0  HGB 7.1* 9.5*  HCT 25.0* 32.5*  PLT 165 179   BMET Recent Labs    05/18/20 0438 05/19/20 0421  NA 138 140  K 3.7 3.1*  CL 103 100  CO2 24 31  GLUCOSE 106* 114*  BUN 18 13  CREATININE 0.68 0.65  CALCIUM 8.1* 8.6*   PT/INR No results for input(s): LABPROT, INR in the last 72 hours.  Studies/Results: DG ABD ACUTE 2+V W 1V CHEST  Result Date: 05/19/2020 CLINICAL DATA:  The reason for exam is stated as GI bleeding. Best obtainable images. Recent colostomy. EXAM: DG ABDOMEN ACUTE WITH 1 VIEW CHEST COMPARISON:  None. FINDINGS: Anterior cervical fusion. Normal cardiac silhouette. Low lung volumes. Bilateral streaky pulmonary opacities. Low lung volumes. Probable pleural effusions. No dilated large or small bowel.  Gas in the rectum. IMPRESSION: 1. No evidence of bowel obstruction. 2. Low lung volumes with streaky bilateral airspace  opacities. Cannot exclude pneumonia. Probable effusions. Electronically Signed   By: Suzy Bouchard M.D.   On: 05/19/2020 08:21    Anti-infectives: Anti-infectives (From admission, onward)   Start     Dose/Rate Route Frequency Ordered Stop   05/16/20 0600  cefoTEtan (CEFOTAN) 2 g in sodium chloride 0.9 % 100 mL IVPB        2 g 200 mL/hr over 30 Minutes Intravenous On call to O.R. 05/15/20 1039 05/16/20 0825   05/15/20 1000  piperacillin-tazobactam (ZOSYN) IVPB 3.375 g        3.375 g 12.5 mL/hr over 240 Minutes Intravenous Every 8 hours 05/15/20 0000     05/15/20 0100  piperacillin-tazobactam (ZOSYN) IVPB 3.375 g        3.375 g 12.5 mL/hr over 240 Minutes Intravenous  Once 05/14/20 2357 05/15/20 1322      Assessment/Plan: Ms. Salamat is a 74 yo with perforated diverticulitis and end colostomy in place. Colostomy is dusky but improving and starting to work. Ostomy RN worked with family yesterday.   PRN for pain IS, OOB to chair HD with A fib  Clear diet ok, monitor ostomy Output, nausea treatment  Ostomy RN teaching  Wound vac change MWF by Ostomy RN  Leukocytosis resolved, Zosyn for 5 days post op per Stop IT trial H&H improved with transfusion, acute post operative blood loss anemia related to Eliquis  SCDs, holding Eliquis with bleeding   Discussed with Dr. Denton Brick, RN.   LOS: 4 days    Ria Comment  Jeanette Caprice 05/19/2020

## 2020-05-19 NOTE — Progress Notes (Signed)
Initial Nutrition Assessment  DOCUMENTATION CODES:   Morbid obesity  INTERVENTION:  With diet advancement provide -Ensure Max po BID, each supplement provides 150 kcal and 30 grams of protein   NUTRITION DIAGNOSIS:   Increased nutrient needs related to post-op healing (sigmoid diverticulitis with perforation s/p partial colectomy, end colostomy) as evidenced by estimated needs.    GOAL:   Patient will meet greater than or equal to 90% of their needs    MONITOR:   PO intake,Diet advancement,Weight trends,Labs,Supplement acceptance,Skin  REASON FOR ASSESSMENT:   Rounds    ASSESSMENT:   74 year old female with past medical history of DM2, diabetic polyneuropathy, chronic dCHF, adrenal insufficiency, atrial fibrillation, CAD, COPD, DVT, erosive gastritis, HLD, HTN, hypothyroidism, morbid obesity, GERD, diverticulosis admitted with sepsis due to sigmoid diverticulitis with perforation. Patient presented with 24 hours of abdominal pain associated with chills and nausea.  Patient is s/p partial colectomy, splenic flexure takedown, and end colostomy on 12/21  Met with patient bedside this afternoon. She reports feeling better, denies nausea and reports tolerating lunch tray today. She reports drinking vanilla Ensure which she enjoyed. Patient reports good appetite at baseline, recalls 3 meals/day. She does not eat a lot of meat, occasionally will have a hamburger. Recalls eggs every morning, fruits (grapes are her favorite), pintos, green beans, stewed potatoes, greens. RD educated on the importance of nutrition and  encouraged po intake. She is agreeable to drinking vanilla flavored supplements to help her meet her needs.   Admit wt 123.2 kg     Current wt 127.2 kg  Patient endorses usual weight of 275 lb, reports eating more balanced meals at home with intentions of losing weight, encouraged by PCP. Prior to  admission she recalls new usual weight around 262 lbs.  I/Os: +1202.6 ml  since admit UOP: 2800 ml x 24 hrs  Medications reviewed and include: D3, Enablex, Gabapentin, Levemir 15 units daily, B12, Protonix, Zosyn, KCl 10 mEq every 1 hr x 4 LR @ 75 ml/hr  Labs: CBGs 130,117,108, K 3.1 (L) A1c 7.1 on 04/11/20 (fairly well controlled)  NUTRITION - FOCUSED PHYSICAL EXAM:  Mild orbital fat depletion  Diet Order:   Diet Order            Diet clear liquid Room service appropriate? Yes; Fluid consistency: Thin  Diet effective now                 EDUCATION NEEDS:   Education needs have been addressed  Skin:  Skin Assessment: Skin Integrity Issues: Skin Integrity Issues:: Incisions,Wound VAC Wound Vac: abdomen Incisions: closed; abdomen  Last BM:  12/19  Height:   Ht Readings from Last 1 Encounters:  05/15/20 '5\' 3"'  (1.6 m)    Weight:   Wt Readings from Last 1 Encounters:  05/19/20 127.2 kg    BMI:  Body mass index is 49.68 kg/m.  Estimated Nutritional Needs:   Kcal:  2035-2290  Protein:  130-141  Fluid:  >/= 2L   Lajuan Lines, RD, LDN Clinical Nutrition After Hours/Weekend Pager # in [place]

## 2020-05-19 NOTE — Progress Notes (Signed)
Physical Therapy Treatment Patient Details Name: Ann [name] MRN: [id number] DOB: [birth year] Today's Date: 05/19/2020    History of Present Illness Cashe Gatt  is a 74 y.o. female, s/p colostomy on 05/16/20 due to acute GI bleeding, who is a reformed smoker with past medical history relevant for CAD with prior stents, prior history of diverticulosis/diverticulitis, GERD and erosive gastritis, chronic atrial fibrillation on anticoagulation with Eliquis,  history of DVT , history of adrenal insufficiency,hypothyroidism, COPD, type 2 diabetes on insulin, and morbid obesity as well as hypertension presents to the ED with abdominal pain of about 24 hours duration it is generalized patient rated at about 9/10 associated with chills and nausea but no emesis    PT Comments    Patient present in bed with husband entering with PT and agreed to PT treatment. Patient on supplemental O2 and had wound vac attached. Completed bed mobility with MinA-ModA and once EOB completed sit to stand transfer to RW with MinA. Patient had varying O2 sats  Once EOB ranging from 78% to 92% and required multiple verbal cues for pursed lipped breathing throughout session to help raise O2Sat. Able to ambulate a few steps to the chair and stand pivot transfer to the chair with MinA-ModA and assist with lines, continuing to assess her O2 sats throughout with patient self reporting that she felt fine. Nursing in room as PT finishing. Patient left in chair with husband in room. Patient will benefit from continued physical therapy in hospital and recommended venue below to increase strength, balance, endurance for safe ADLs and gait.   Follow Up Recommendations  SNF;Supervision for mobility/OOB;Supervision - Intermittent     Equipment Recommendations  None recommended by PT    Recommendations for Other Services       Precautions / Restrictions Precautions Precautions: Fall Restrictions Weight Bearing Restrictions: No     Mobility  Bed Mobility Overal bed mobility: Needs Assistance Bed Mobility: Supine to Sit     Supine to sit: Min assist;Mod assist     General bed mobility comments: increased time, labored movement  Transfers Overall transfer level: Needs assistance Equipment used: Rolling walker (2 wheeled) Transfers: Sit to/from [facility] Sit to Stand: Min assist Stand pivot transfers: Min assist       General transfer comment: slightly increased BLE strength for completing sit to stands and transfers  Ambulation/Gait Ambulation/Gait assistance: Min assist;Mod assist Gait Distance (Feet): 5 Feet Assistive device: Rolling walker (2 wheeled) Gait Pattern/deviations: Decreased step length - right;Decreased step length - left;Decreased stride length Gait velocity: decreased   General Gait Details: limited to 5-6 slow lateral steps at bedside and pivoting to chair due to generalized weakness and fatigue   Stairs             Wheelchair Mobility    Modified Rankin (Stroke Patients Only)       Balance Overall balance assessment: Needs assistance Sitting-balance support: Feet supported;No upper extremity supported Sitting balance-Leahy Scale: Fair Sitting balance - Comments: fair/good seated at EOB   Standing balance support: During functional activity;Bilateral upper extremity supported Standing balance-Leahy Scale: Fair Standing balance comment: using RW                            Cognition Arousal/Alertness: Awake/alert Behavior During Therapy: WFL for tasks assessed/performed Overall Cognitive Status: Within Functional Limits for tasks assessed  Exercises General Exercises - Lower Extremity Ankle Circles/Pumps: AROM;Both;Strengthening;Supine    General Comments        Pertinent Vitals/Pain Pain Assessment: Faces Faces Pain Scale: Hurts a little bit Pain Location:  stomach Pain Descriptors / Indicators: Sore Pain Intervention(s): Monitored during session;Limited activity within patient's tolerance    Home Living                      Prior Function            PT Goals (current goals can now be found in the care plan section) Acute Rehab PT Goals Patient Stated Goal: return home with assistance PT Goal Formulation: With patient Time For Goal Achievement: 06/07/20 Potential to Achieve Goals: Good Progress towards PT goals: Progressing toward goals    Frequency    Min 3X/week      PT Plan Current plan remains appropriate    Co-evaluation              AM-PAC PT "6 Clicks" Mobility   Outcome Measure  Help needed turning from your back to your side while in a flat bed without using bedrails?: A Little Help needed moving from lying on your back to sitting on the side of a flat bed without using bedrails?: A Lot Help needed moving to and from a bed to a chair (including a wheelchair)?: A Lot Help needed standing up from a chair using your arms (e.g., wheelchair or bedside chair)?: A Lot Help needed to walk in hospital room?: A Lot Help needed climbing 3-5 steps with a railing? : Total 6 Click Score: 12    End of Session Equipment Utilized During Treatment: Oxygen Activity Tolerance: Patient tolerated treatment well;Patient limited by fatigue Patient left: in chair;with call bell/phone within reach;with nursing/sitter in room Nurse Communication: Mobility status PT Visit Diagnosis: Unsteadiness on feet (R26.81);Other abnormalities of gait and mobility (R26.89);Muscle weakness (generalized) (M62.81)     Time: 1010-1038 PT Time Calculation (min) (ACUTE ONLY): 28 min  Charges:  $Therapeutic Activity: 23-37 mins                     10:58 AM,05/19/20 Domenic Moras, PT, DPT Physical Therapist at [facility]

## 2020-05-20 DIAGNOSIS — I4891 Unspecified atrial fibrillation: Secondary | ICD-10-CM

## 2020-05-20 LAB — BASIC METABOLIC PANEL
Anion gap: 6 (ref 5–15)
BUN: 12 mg/dL (ref 8–23)
CO2: 33 mmol/L — ABNORMAL HIGH (ref 22–32)
Calcium: 8.9 mg/dL (ref 8.9–10.3)
Chloride: 101 mmol/L (ref 98–111)
Creatinine, Ser: 0.54 mg/dL (ref 0.44–1.00)
GFR, Estimated: >60 mL/min (ref >60)
Glucose, Bld: 154 mg/dL — ABNORMAL HIGH (ref 70–99)
Potassium: 4.4 mmol/L (ref 3.5–5.1)
Sodium: 140 mmol/L (ref 135–145)

## 2020-05-20 LAB — CBC
HCT: 34.3 % — ABNORMAL LOW (ref 36.0–46.0)
Hemoglobin: 9.8 g/dL — ABNORMAL LOW (ref 12.0–15.0)
MCH: 24.6 pg — ABNORMAL LOW (ref 26.0–34.0)
MCHC: 28.6 g/dL — ABNORMAL LOW (ref 30.0–36.0)
MCV: 86.2 fL (ref 80.0–100.0)
Platelets: 189 10*3/uL (ref 150–400)
RBC: 3.98 MIL/uL (ref 3.87–5.11)
RDW: 17.1 % — ABNORMAL HIGH (ref 11.5–15.5)
WBC: 11.9 10*3/uL — ABNORMAL HIGH (ref 4.0–10.5)
nRBC: 0.2 % (ref 0.0–0.2)

## 2020-05-20 LAB — CULTURE, BLOOD (ROUTINE X 2)
Culture: NO GROWTH
Culture: NO GROWTH
Special Requests: ADEQUATE

## 2020-05-20 LAB — GLUCOSE, CAPILLARY
Glucose-Capillary: 114 mg/dL — ABNORMAL HIGH (ref 70–99)
Glucose-Capillary: 148 mg/dL — ABNORMAL HIGH (ref 70–99)
Glucose-Capillary: 156 mg/dL — ABNORMAL HIGH (ref 70–99)
Glucose-Capillary: 160 mg/dL — ABNORMAL HIGH (ref 70–99)
Glucose-Capillary: 168 mg/dL — ABNORMAL HIGH (ref 70–99)
Glucose-Capillary: 173 mg/dL — ABNORMAL HIGH (ref 70–99)

## 2020-05-20 LAB — MAGNESIUM: Magnesium: 1.7 mg/dL (ref 1.7–2.4)

## 2020-05-20 MED ORDER — PANTOPRAZOLE SODIUM 40 MG PO TBEC
40.0000 mg | DELAYED_RELEASE_TABLET | Freq: Every day | ORAL | Status: DC
  Administered 2020-05-20 – 2020-05-25 (×6): 40 mg via ORAL
  Filled 2020-05-20 (×6): qty 1

## 2020-05-20 MED ORDER — GLUCERNA 1.2 CAL PO LIQD
1000.0000 mL | ORAL | Status: DC
  Administered 2020-05-20: 11:00:00 1000 mL

## 2020-05-20 MED ORDER — HYDROCORTISONE NA SUCCINATE PF 100 MG IJ SOLR
25.0000 mg | Freq: Two times a day (BID) | INTRAMUSCULAR | Status: AC
  Administered 2020-05-20 – 2020-05-22 (×5): 25 mg via INTRAVENOUS
  Filled 2020-05-20 (×5): qty 2

## 2020-05-20 MED ORDER — MAGNESIUM SULFATE 2 GM/50ML IV SOLN
2.0000 g | Freq: Once | INTRAVENOUS | Status: AC
  Administered 2020-05-20: 12:00:00 2 g via INTRAVENOUS
  Filled 2020-05-20: qty 50

## 2020-05-20 NOTE — Progress Notes (Signed)
PROGRESS NOTE        PATIENT DETAILS Name: Ann [name] Age: 74 y.o. Sex: female Date of Birth: 1945/12/15 Admit Date: 05/14/2020 Admitting Physician Evalee Mutton Kristeen Mans, MD UD:[id number], Tesfaye, MD  Brief Narrative: Patient is a 74 y.o. female chronic diastolic heart failure, chronic atrial fibrillation, adrenal insufficiency, DM-2, COPD, CAD-s/p PCI-presented to the ED with 1 day history of left lower quadrant abdominal pain and hematochezia.  Further evaluation revealed sigmoid diverticulitis with perforation.  Hospital course complicated by acute blood loss anemia and hypotension-requiring transfer to the ICU on 12/20 for close monitoring.  Evaluated by general surgery-with plans to perform colectomy/colostomy on 12/21.  See below for further details.  Significant events: 12/19>> admit for sigmoid diverticulitis with perforation-hematochezia with acute blood loss anemia 12/20>> multiple episodes of hematochezia overnight-hypotension-transfer to ICU 12/21>>To OR-colectomy/colostomy  Significant studies: 12/19>> CT abdomen/pelvis: Sigmoid diverticulitis with contained perforation.  Antimicrobial therapy: Zosyn: 12/19>>  Microbiology data: 12/21>> intraoperative/intraperitoneal cultures: NGTD  Procedures : 12/21>> partial colectomy with end colostomy-Dr Constance Haw  Consults: GI, general surgery  DVT Prophylaxis : SCDs Start: 05/14/20 1348 Place TED hose Start: 05/14/20 1348   Subjective: -Oral intake remains a challenge Advised family to bring liquid food items for patient to see if she will prefer that -Reluctant to get out of bed even with assistance -No emesis, some nausea  Assessment/Plan: Hypotension: Resolved multifactorial etiology-secondary to acute blood loss, sepsis-and relative adrenal insufficiency.  Improved after PRBC transfusion, IV fluid-patient and initialing of stress dose steroids. -Change hydrocortisone to 25 mg IV every 12  hours and slowly taper to oral regimen  Sepsis due to sigmoid diverticulitis with contained perforation: Sepsis physiology has improved-underwent colectomy with colostomy placement on 12/21-discussed with Dr. Constance Haw- --continue antimicrobial therapy with Zosyn through 05/19/2020 due to peritoneal contamination.    intraoperative cultures-NGTD -05/20/20--ostomy looks much less dusky, general surgeon recommends further observation, no need for ostomy revision at this time -Ostomy RN was teaching family ostomy care  Lower GI bleeding/Hematochezia with acute blood loss anemia: Unusual for diverticular bleeding in the setting of diverticulitis-   S/p 2 units of PRBC transfusion-hemoglobin stable.  GI/general surgery following-she is s/p colectomy-probably does not require further work-up.  --Hemoglobin up to 9.8 after a total of 3 units of PRBC this admission   Follow surgical biopsies -Will need outpatient GI follow-up for endoluminal evaluation  Chronic atrial fibrillation with RVR: Rate slowly creeping up-likely due to pain at surgical site-Cardizem held on 12/20 due to hypotension-we will resume short acting Cardizem-once rate better controlled-we can switch her back to Cardizem. -Concerns about ongoing bleeding- Eliquis remains on hold  Chronic diastolic heart failure: Volume status stable-Lasix/other therapies on hold. -Discharged with IV fluids to avoid volume overload  CAD-history of prior PCI: No anginal symptoms-not on aspirin as patient on anticoagulation.  COPD: Stable-continue bronchodilators.  AKI: Mild-likely hemodynamically mediated-with some element of contrast-induced nephropathy (received contrast for CT abdomen on admission). -AKI has resolved  - renally adjust medications, avoid nephrotoxic agents / dehydration  / hypotension  Relative adrenal insufficiency: was on 10 mg of prednisone daily at home- -see above regarding hypotension-now on stress dose hydrocortisone- will  gradually taper down to usual prednisone dosing over the next few days.   --Change hydrocortisone to 25 mg IV every 12 hours and slowly taper to oral regimen  History of vitamin B12 deficiency:  Continue supplementation  Hypothyroidism (TSH 1.22 on 8/12): On Synthroid  DM-2 (A1c 7.1 on 11/16): CBGs relatively stable- -decrease detemir to 15 units daily-change SSI to every 4 hours.  Follow and adjust  Recent Labs    05/20/20 0409 05/20/20 0903 05/20/20 1114  GLUCAP 148* 156* 160*    Morbid Obesity: Estimated body mass index is 49.68 kg/m as calculated from the following:   Height as of this encounter: 5\' 3"  (1.6 m).   Weight as of this encounter: 127.2 kg.   --Generalized weakness and deconditioning--PT eval appreciated, recommends SNF rehab, patient is reluctant to consider SNF rehab  Diet: Diet Order            Diet full liquid Room service appropriate? Yes; Fluid consistency: Thin  Diet effective now                 Code Status: DNR  Family Communication: Spouse [name] [phone])-and son  Disposition Plan: Status [id number]  The patient will require care spanning > 2 midnights and should be moved to inpatient because: - Awaiting return of bowel function and tolerance of oral intake prior to discharge   Dispo: The patient is from: Home              Anticipated d/c is to: SNF [facility]s [facility] with [facility]              Anticipated d/c date is: > 3 days              Patient currently is not medically stable to d/c.  Barriers to Discharge:     Awaiting return of bowel function and tolerance of oral intake prior to discharge  Antimicrobial agents: Anti-infectives (From admission, onward)   Start     Dose/Rate Route Frequency Ordered Stop   05/16/20 0600  cefoTEtan (CEFOTAN) 2 g in sodium chloride 0.9 % 100 mL IVPB        2 g 200 mL/hr over 30 Minutes Intravenous On call to O.R. 05/15/20 1039 05/16/20 0825   05/15/20 1000  piperacillin-tazobactam (ZOSYN) IVPB 3.375 g         3.375 g 12.5 mL/hr over 240 Minutes Intravenous Every 8 hours 05/15/20 0000 05/20/20 0938   05/15/20 0100  piperacillin-tazobactam (ZOSYN) IVPB 3.375 g        3.375 g 12.5 mL/hr over 240 Minutes Intravenous  Once 05/14/20 2357 05/15/20 1322      MEDICATIONS: Scheduled Meds: . atorvastatin  80 mg Oral q1800  . Chlorhexidine Gluconate Cloth  6 each Topical Daily  . cholecalciferol  5,000 Units Oral Daily  . darifenacin  7.5 mg Oral Daily  . diltiazem  30 mg Oral Q8H  . gabapentin  400 mg Oral TID  . hydrocortisone sod succinate (SOLU-CORTEF) inj  50 mg Intravenous Q12H  . insulin aspart  0-9 Units Subcutaneous Q4H  . insulin detemir  15 Units Subcutaneous Q2200  . levothyroxine  150 mcg Oral QAC breakfast  . mirabegron ER  50 mg Oral Daily  . pantoprazole  40 mg Oral Daily  . sertraline  50 mg Oral QHS  . sodium chloride flush  10-40 mL Intracatheter Q12H  . sodium chloride flush  3 mL Intravenous Q12H  . sodium chloride flush  3 mL Intravenous Q12H  . umeclidinium bromide  1 puff Inhalation Daily  . cyanocobalamin  1,000 mcg Oral Daily   Continuous Infusions: . sodium chloride    . feeding supplement (GLUCERNA 1.2 CAL)    .  lactated ringers 50 mL/hr at 05/19/20 1854  . magnesium sulfate bolus IVPB    . sodium chloride Stopped (05/15/20 0945)   PRN Meds:.sodium chloride, acetaminophen **OR** acetaminophen, albuterol, metoprolol tartrate, nitroGLYCERIN, ondansetron **OR** ondansetron (ZOFRAN) IV, oxyCODONE, polyvinyl alcohol, sodium chloride, sodium chloride flush, sodium chloride flush, traZODone   PHYSICAL EXAM: Vital signs: Vitals:   05/19/20 1800 05/19/20 1854 05/19/20 2013 05/20/20 0408  BP:  120/77  (!) 152/85  Pulse: (!) 53 77  73  Resp: 18 18  18   Temp:  (!) 97.5 F (36.4 C)  98 F (36.7 C)  TempSrc:  Oral    SpO2: 98% 92% (!) 88% 96%  Weight:      Height:       Filed Weights   05/17/20 0448 05/18/20 0500 05/19/20 0500  Weight: 127.2 kg 127.2 kg 127.2  kg   Body mass index is 49.68 kg/m.   Phy Exam- Gen Exam:Alert awake-morbidly obese, not in any distress HEENT:atraumatic, normocephalic Chest: B/L clear to auscultation anteriorly CVS:S1S2 irregular Abdomen: Soft-appropriately tender, +bs, -Ostomy bag with liquid stool  Extremities:no edema, good pedal pulses Neurology: Generalized weakness, Non focal Skin: no rash, warm and dry  LABORATORY DATA: CBC: Recent Labs  Lab 05/16/20 1241 05/17/20 0442 05/18/20 0438 05/19/20 0421 05/20/20 0729  WBC 20.4* 13.6* 9.1 10.0 11.9*  HGB 9.8* 8.8* 7.1* 9.5* 9.8*  HCT 34.0* 31.5* 25.0* 32.5* 34.3*  MCV 85.6 86.1 87.1 86.2 86.2  PLT 227 184 165 179 [id number]    Basic Metabolic Panel: Recent Labs  Lab 05/15/20 2045 05/16/20 0444 05/17/20 0442 05/18/20 0438 05/19/20 0421 05/20/20 0729  NA 137 140 140 138 140 140  K 4.0 3.6 4.2 3.7 3.1* 4.4  CL 101 103 105 103 100 101  CO2 25 28 26 24 31  33*  GLUCOSE 143* 138* 141* 106* 114* 154*  BUN 27* 25* 29* 18 13 12   CREATININE 1.31* 1.05* 1.05* 0.68 0.65 0.54  CALCIUM 7.4* 7.6* 7.9* 8.1* 8.6* 8.9  MG 1.4*  --   --  1.8  --  1.7    GFR: Estimated Creatinine Clearance: 80.2 mL/min (by C-G formula based on SCr of 0.54 mg/dL).  Liver Function Tests: Recent Labs  Lab 05/14/20 1201 05/16/20 0444 05/17/20 0442 05/18/20 0438  AST 20 20 34 35  ALT 14 13 16 17   ALKPHOS 54 49 46 39  BILITOT 0.5 0.6 0.5 0.5  PROT 7.0 6.2* 5.8* 5.2*  ALBUMIN 3.7 3.0* 2.6* 2.5*   No results for input(s): LIPASE, AMYLASE in the last 168 hours. No results for input(s): AMMONIA in the last 168 hours.  Coagulation Profile: Recent Labs  Lab 05/14/20 1246  INR 1.4*    Cardiac Enzymes: No results for input(s): CKTOTAL, CKMB, CKMBINDEX, TROPONINI in the last 168 hours.  BNP (last 3 results) No results for input(s): PROBNP in the last 8760 hours.  Lipid Profile: No results for input(s): CHOL, HDL, LDLCALC, TRIG, CHOLHDL, LDLDIRECT in the last 72  hours.  Thyroid Function Tests: No results for input(s): TSH, T4TOTAL, FREET4, T3FREE, THYROIDAB in the last 72 hours.  Anemia Panel: No results for input(s): VITAMINB12, FOLATE, FERRITIN, TIBC, IRON, RETICCTPCT in the last 72 hours.  Urine analysis:    Component Value Date/Time   COLORURINE YELLOW 01/23/2020 1819   APPEARANCEUR CLEAR 01/23/2020 1819   LABSPEC 1.012 01/23/2020 1819   PHURINE 5.0 01/23/2020 1819   GLUCOSEU NEGATIVE 01/23/2020 1819   HGBUR NEGATIVE 01/23/2020 1819   BILIRUBINUR NEGATIVE 01/23/2020 1819  KETONESUR NEGATIVE 01/23/2020 1819   PROTEINUR NEGATIVE 01/23/2020 1819   UROBILINOGEN 0.2 03/15/2013 1504   NITRITE NEGATIVE 01/23/2020 1819   LEUKOCYTESUR NEGATIVE 01/23/2020 1819   Sepsis Labs: Lactic Acid, Venous    Component Value Date/Time   LATICACIDVEN 1.2 05/15/2020 0345   MICROBIOLOGY: Recent Results (from the past 240 hour(s))  Resp Panel by RT-PCR (Flu A&B, Covid) Nasopharyngeal Swab     Status: None   Collection Time: 05/14/20 12:05 PM   Specimen: Nasopharyngeal Swab; Nasopharyngeal(NP) swabs in vial transport medium  Result Value Ref Range Status   SARS Coronavirus 2 by RT PCR NEGATIVE NEGATIVE Final    Comment: (NOTE) SARS-CoV-2 target nucleic acids are NOT DETECTED.  The SARS-CoV-2 RNA is generally detectable in upper respiratory specimens during the acute phase of infection. The lowest concentration of SARS-CoV-2 viral copies this assay can detect is 138 copies/mL. A negative result does not preclude SARS-Cov-2 infection and should not be used as the sole basis for treatment or other patient management decisions. A negative result may occur with  improper specimen collection/handling, submission of specimen other than nasopharyngeal swab, presence of viral mutation(s) within the areas targeted by this assay, and inadequate number of viral copies(<138 copies/mL). A negative result must be combined with clinical observations, patient  history, and epidemiological information. The expected result is Negative.  Fact Sheet for Patients:  EntrepreneurPulse.com.au  Fact Sheet for Healthcare Providers:  IncredibleEmployment.be  This test is no t yet approved or cleared by the [place] FDA and  has been authorized for detection and/or diagnosis of SARS-CoV-2 by FDA under an Emergency Use Authorization (EUA). This EUA will remain  in effect (meaning this test can be used) for the duration of the COVID-19 declaration under Section 564(b)(1) of the Act, 21 U.S.C.section 360bbb-3(b)(1), unless the authorization is terminated  or revoked sooner.       Influenza A by PCR NEGATIVE NEGATIVE Final   Influenza B by PCR NEGATIVE NEGATIVE Final    Comment: (NOTE) The Xpert Xpress SARS-CoV-2/FLU/RSV plus assay is intended as an aid in the diagnosis of influenza from Nasopharyngeal swab specimens and should not be used as a sole basis for treatment. Nasal washings and aspirates are unacceptable for Xpert Xpress SARS-CoV-2/FLU/RSV testing.  Fact Sheet for Patients: EntrepreneurPulse.com.au  Fact Sheet for Healthcare Providers: IncredibleEmployment.be  This test is not yet approved or cleared by the [place] FDA and has been authorized for detection and/or diagnosis of SARS-CoV-2 by FDA under an Emergency Use Authorization (EUA). This EUA will remain in effect (meaning this test can be used) for the duration of the COVID-19 declaration under Section 564(b)(1) of the Act, 21 U.S.C. section 360bbb-3(b)(1), unless the authorization is terminated or revoked.  Performed at [facility], [address], [place], [place] [address]   MRSA PCR Screening     Status: None   Collection Time: 05/15/20  8:51 AM   Specimen: Nasal Mucosa; Nasopharyngeal  Result Value Ref Range Status   MRSA by PCR NEGATIVE NEGATIVE Final    Comment:        The GeneXpert MRSA  Assay (FDA approved for NASAL specimens only), is one component of a comprehensive MRSA colonization surveillance program. It is not intended to diagnose MRSA infection nor to guide or monitor treatment for MRSA infections. Performed at [facility], [address], [place], [place] [address]   Culture, blood (routine x 2)     Status: None   Collection Time: 05/15/20 10:26 AM  Specimen: BLOOD RIGHT HAND  Result Value Ref Range Status   Specimen Description BLOOD RIGHT HAND  Final   Special Requests   Final    BOTTLES DRAWN AEROBIC AND ANAEROBIC Blood Culture results may not be optimal due to an inadequate volume of blood received in culture bottles   Culture   Final    NO GROWTH 5 DAYS Performed at [facility], [address], [place], [place] [address]    Report Status 05/20/2020 FINAL  Final  Culture, blood (routine x 2)     Status: None   Collection Time: 05/15/20 10:31 AM   Specimen: BLOOD LEFT HAND  Result Value Ref Range Status   Specimen Description BLOOD LEFT HAND  Final   Special Requests   Final    BOTTLES DRAWN AEROBIC AND ANAEROBIC Blood Culture adequate volume   Culture   Final    NO GROWTH 5 DAYS Performed at [facility], [address] [place], [place] [address]    Report Status 05/20/2020 FINAL  Final  Aerobic/Anaerobic Culture (surgical/deep wound)     Status: None (Preliminary result)   Collection Time: 05/16/20 10:09 AM   Specimen: PATH Cytology Peritoneal fluid; Body Fluid  Result Value Ref Range Status   Specimen Description   Final    PERITONEAL Performed at [facility], [address], [place], [place] [address]    Special Requests   Final    NONE Performed at [address] [place], [place] [address]    Gram Stain   Final    FEW WBC PRESENT,BOTH PMN AND MONONUCLEAR RARE GRAM VARIABLE ROD Performed at [facility], [place] [address] [place], [place] [address]    Culture   Final    CULTURE REINCUBATED FOR BETTER GROWTH NO  ANAEROBES ISOLATED; CULTURE IN PROGRESS FOR 5 DAYS    Report Status PENDING  Incomplete    RADIOLOGY STUDIES/RESULTS: DG ABD ACUTE 2+V W 1V CHEST  Result Date: 05/19/2020 CLINICAL DATA:  The reason for exam is stated as GI bleeding. Best obtainable images. Recent colostomy. EXAM: DG ABDOMEN ACUTE WITH 1 VIEW CHEST COMPARISON:  None. FINDINGS: Anterior cervical fusion. Normal cardiac silhouette. Low lung volumes. Bilateral streaky pulmonary opacities. Low lung volumes. Probable pleural effusions. No dilated large or small bowel.  Gas in the rectum. IMPRESSION: 1. No evidence of bowel obstruction. 2. Low lung volumes with streaky bilateral airspace opacities. Cannot exclude pneumonia. Probable effusions. Electronically Signed   By: Suzy Bouchard M.D.   On: 05/19/2020 08:21    LOS: 5 days   Roxan Hockey, MD  [facility]  To contact the attending provider between 7A-7P or the covering provider during after hours 7P-7A, please log into the web site [email or web address] and access using universal [place] password for that web site. If you do not have the password, please call the hospital operator.  05/20/2020, 11:32 AM

## 2020-05-20 NOTE — Progress Notes (Signed)
[facility] Progress Note  4 Days Post-Op  Subjective: Patient tolerating clear liquids.  Initially reported feeling "sick," but upon further questioning, she says she is gagging on phlegm in her throat.   Objective: Vital signs in last 24 hours: Temp:  [97.5 F (36.4 C)-98.4 F (36.9 C)] 98 F (36.7 C) (12/25 0408) Pulse Rate:  [46-81] 73 (12/25 0408) Resp:  [15-25] 18 (12/25 0408) BP: (120-191)/(77-147) 152/85 (12/25 0408) SpO2:  [88 %-100 %] 96 % (12/25 0408) Last BM Date: 05/19/20  Intake/Output from previous day: 12/24 0701 - 12/25 0700 In: 3676.2 [I.V.:3070.1; IV Piggyback:606.1] Out: 1600 [Urine:1050; Stool:550] Intake/Output this shift: No intake/output data recorded.  General appearance: alert, cooperative and no distress Resp: normal work of breathing on [place] O2 GI: soft, obese, wound vac in place and ostomy with gas and liquid stool with more particulate matter/soft loose stool  Lab Results:  Recent Labs    05/19/20 0421 05/20/20 0729  WBC 10.0 11.9*  HGB 9.5* 9.8*  HCT 32.5* 34.3*  PLT 179 189   BMET Recent Labs    05/19/20 0421 05/20/20 0729  NA 140 140  K 3.1* 4.4  CL 100 101  CO2 31 33*  GLUCOSE 114* 154*  BUN 13 12  CREATININE 0.65 0.54  CALCIUM 8.6* 8.9   PT/INR No results for input(s): LABPROT, INR in the last 72 hours.  Studies/Results: DG ABD ACUTE 2+V W 1V CHEST  Result Date: 05/19/2020 CLINICAL DATA:  The reason for exam is stated as GI bleeding. Best obtainable images. Recent colostomy. EXAM: DG ABDOMEN ACUTE WITH 1 VIEW CHEST COMPARISON:  None. FINDINGS: Anterior cervical fusion. Normal cardiac silhouette. Low lung volumes. Bilateral streaky pulmonary opacities. Low lung volumes. Probable pleural effusions. No dilated large or small bowel.  Gas in the rectum. IMPRESSION: 1. No evidence of bowel obstruction. 2. Low lung volumes with streaky bilateral airspace opacities. Cannot exclude pneumonia. Probable effusions.  Electronically Signed   By: Suzy Bouchard M.D.   On: 05/19/2020 08:21    Anti-infectives: Anti-infectives (From admission, onward)   Start     Dose/Rate Route Frequency Ordered Stop   05/16/20 0600  cefoTEtan (CEFOTAN) 2 g in sodium chloride 0.9 % 100 mL IVPB        2 g 200 mL/hr over 30 Minutes Intravenous On call to O.R. 05/15/20 1039 05/16/20 0825   05/15/20 1000  piperacillin-tazobactam (ZOSYN) IVPB 3.375 g        3.375 g 12.5 mL/hr over 240 Minutes Intravenous Every 8 hours 05/15/20 0000 05/20/20 1100   05/15/20 0100  piperacillin-tazobactam (ZOSYN) IVPB 3.375 g        3.375 g 12.5 mL/hr over 240 Minutes Intravenous  Once 05/14/20 2357 05/15/20 1322      Assessment/Plan: Ann [name] is a 74 yo with perforated diverticulitis and end colostomy in place. Colostomy is functional and patient tolerating clear liquids.  PRN for pain IS, OOB to chair; PT consult placed. HD with A fib  Advance diet to full liquids with Glucerna supplements  Ostomy RN teaching  Wound vac change MWF by Ostomy RN  Leukocytosis resolved, Zosyn for 5 days post op per Stop IT trial H&H improved with transfusion, acute post operative blood loss anemia related to Eliquis  SCDs, holding Eliquis with bleeding      LOS: 5 days    Fredirick Maudlin 05/20/2020

## 2020-05-21 DIAGNOSIS — I25118 Atherosclerotic heart disease of native coronary artery with other forms of angina pectoris: Secondary | ICD-10-CM

## 2020-05-21 DIAGNOSIS — I5032 Chronic diastolic (congestive) heart failure: Secondary | ICD-10-CM

## 2020-05-21 LAB — GLUCOSE, CAPILLARY
Glucose-Capillary: 142 mg/dL — ABNORMAL HIGH (ref 70–99)
Glucose-Capillary: 120 mg/dL — ABNORMAL HIGH (ref 70–99)
Glucose-Capillary: 154 mg/dL — ABNORMAL HIGH (ref 70–99)
Glucose-Capillary: 245 mg/dL — ABNORMAL HIGH (ref 70–99)
Glucose-Capillary: 156 mg/dL — ABNORMAL HIGH (ref 70–99)
Glucose-Capillary: 168 mg/dL — ABNORMAL HIGH (ref 70–99)

## 2020-05-21 MED ORDER — INSULIN DETEMIR 100 UNIT/ML ~~LOC~~ SOLN
18.0000 [IU] | Freq: Every day | SUBCUTANEOUS | Status: DC
  Administered 2020-05-21 – 2020-05-24 (×4): 18 [IU] via SUBCUTANEOUS
  Filled 2020-05-21: qty 0.18

## 2020-05-21 NOTE — Progress Notes (Signed)
Inpatient Rehab Admissions Coordinator Note:   Pt. was screened for CIR candidacy by Clemens Catholic, [place] CCC-SLP. At this time, Pt. Appears to have functional decline and looks to be an appropriate candidate for CIR. Will place order for rehab consult per protocol.  Please contact me with questions.   Clemens Catholic, [place], [place] Admissions Coordinator  [phone] ([place]) [phone] (office)

## 2020-05-21 NOTE — Plan of Care (Signed)

## 2020-05-21 NOTE — Progress Notes (Addendum)
Physical Therapy Treatment Patient Details Name: Ann [name] MRN: [id number] DOB: 1945-07-05 Today's Date: 05/21/2020    History of Present Illness Ann [name]  is a 74 y.o. female, s/p colostomy on 05/16/20 due to acute GI bleeding, who is a reformed smoker with past medical history relevant for CAD with prior stents, prior history of diverticulosis/diverticulitis, GERD and erosive gastritis, chronic atrial fibrillation on anticoagulation with Eliquis,  history of DVT , history of adrenal insufficiency,hypothyroidism, COPD, type 2 diabetes on insulin, and morbid obesity as well as hypertension presents to the ED with abdominal pain of about 24 hours duration it is generalized patient rated at about 9/10 associated with chills and nausea but no emesis    PT Comments    Patient demonstrates increased endurance/distance for gait training with slow labored cadence without loss of balance, made it to doorway and back to bedside while on 3 LPM O2, once seated reduced to 2 LPM O2 with SpO2 at 96%.  Patient demonstrates good return for completing BLE ROM/strengthening exercises while seated at bedside and tolerated staying up in chair after therapy - RN aware.  Patient will benefit from continued physical therapy in hospital and recommended venue below to increase strength, balance, endurance for safe ADLs and gait.    Follow Up Recommendations    CIR;Supervision for mobility/OOB;Supervision - Intermittent  Equipment Recommendations  None recommended by PT    Recommendations for Other Services       Precautions / Restrictions Precautions Precautions: Fall Restrictions Weight Bearing Restrictions: No    Mobility  Bed Mobility Overal bed mobility: Needs Assistance Bed Mobility: Supine to Sit     Supine to sit: HOB elevated;Min assist;Mod assist     General bed mobility comments: increased time, labored movement, required HOB slightly raised  Transfers Overall transfer level:  Needs assistance Equipment used: Rolling walker (2 wheeled) Transfers: Sit to/from [facility] Sit to Stand: Min assist Stand pivot transfers: Min assist       General transfer comment: slow labored movement  Ambulation/Gait Ambulation/Gait assistance: Min assist;Mod assist Gait Distance (Feet): 25 Feet Assistive device: Rolling walker (2 wheeled) Gait Pattern/deviations: Decreased step length - right;Decreased step length - left;Decreased stride length Gait velocity: decreased   General Gait Details: demonstrates increased enduranc/distance for ambulation with slow labored cadence and increased time to turn using RW due to unsteadiness, limited mostly due to fatigue   Stairs             Wheelchair Mobility    Modified Rankin (Stroke Patients Only)       Balance Overall balance assessment: Needs assistance Sitting-balance support: Feet supported;No upper extremity supported Sitting balance-Leahy Scale: Good Sitting balance - Comments: seated at EOB   Standing balance support: During functional activity;Bilateral upper extremity supported Standing balance-Leahy Scale: Fair Standing balance comment: using RW                            Cognition Arousal/Alertness: Awake/alert Behavior During Therapy: WFL for tasks assessed/performed Overall Cognitive Status: Within Functional Limits for tasks assessed                                        Exercises General Exercises - Lower Extremity Long Arc Quad: Seated;AROM;Strengthening;Both;10 reps Hip Flexion/Marching: Seated;AROM;Strengthening;Both;10 reps Toe Raises: Seated;AROM;Strengthening;Both;15 reps Heel Raises: Seated;AROM;Strengthening;Both;15 reps    General Comments  Pertinent Vitals/Pain Pain Assessment: No/denies pain    Home Living                      Prior Function            PT Goals (current goals can now be found in the care plan  section) Acute Rehab PT Goals Patient Stated Goal: return home with assistance PT Goal Formulation: With patient Time For Goal Achievement: 06/07/20 Potential to Achieve Goals: Good Progress towards PT goals: Progressing toward goals    Frequency    Min 3X/week      PT Plan Current plan remains appropriate    Co-evaluation              AM-PAC PT "6 Clicks" Mobility   Outcome Measure  Help needed turning from your back to your side while in a flat bed without using bedrails?: A Little Help needed moving from lying on your back to sitting on the side of a flat bed without using bedrails?: A Lot Help needed moving to and from a bed to a chair (including a wheelchair)?: A Lot Help needed standing up from a chair using your arms (e.g., wheelchair or bedside chair)?: A Little Help needed to walk in hospital room?: A Lot Help needed climbing 3-5 steps with a railing? : A Lot 6 Click Score: 14    End of Session Equipment Utilized During Treatment: Oxygen Activity Tolerance: Patient tolerated treatment well;Patient limited by fatigue Patient left: in chair;with call bell/phone within reach;with nursing/sitter in room Nurse Communication: Mobility status PT Visit Diagnosis: Unsteadiness on feet (R26.81);Other abnormalities of gait and mobility (R26.89);Muscle weakness (generalized) (M62.81)     Time: [id number] PT Time Calculation (min) (ACUTE ONLY): 30 min  Charges:  $Therapeutic Exercise: 8-22 mins $Therapeutic Activity: 8-22 mins                     1:19 PM, 05/21/20 Ann [name], MPT Physical Therapist with [facility] [phone] office [phone] mobile phone

## 2020-05-21 NOTE — Progress Notes (Signed)
[facility] Progress Note  5 Days Post-Op  Subjective: Patient tolerating full liquids.  The gagging sensation she experienced in her throat yesterday has resolved.  Objective: Vital signs in last 24 hours: Temp:  [97.8 F (36.6 C)-98.4 F (36.9 C)] 98.2 F (36.8 C) (12/26 0513) Pulse Rate:  [62-98] 87 (12/26 0515) Resp:  [16-18] 16 (12/26 0513) BP: (142-173)/(81-114) 152/100 (12/26 0515) SpO2:  [93 %-98 %] 98 % (12/26 0617) Last BM Date: 05/20/20 (ostomy)  Intake/Output from previous day: 12/25 0701 - 12/26 0700 In: 1652 [P.O.:720; I.V.:882; IV Piggyback:50] Out: 7412 [Urine:3150; Stool:225] Intake/Output this shift: No intake/output data recorded.  General appearance: alert, cooperative and no distress Resp: normal work of breathing on [place] O2 GI: soft, obese, wound vac in place and ostomy with gas and semisolid stool  Lab Results:  Recent Labs    05/19/20 0421 05/20/20 0729  WBC 10.0 11.9*  HGB 9.5* 9.8*  HCT 32.5* 34.3*  PLT 179 189   BMET Recent Labs    05/19/20 0421 05/20/20 0729  NA 140 140  K 3.1* 4.4  CL 100 101  CO2 31 33*  GLUCOSE 114* 154*  BUN 13 12  CREATININE 0.65 0.54  CALCIUM 8.6* 8.9   PT/INR No results for input(s): LABPROT, INR in the last 72 hours.  Studies/Results: No results found.  Anti-infectives: Anti-infectives (From admission, onward)   Start     Dose/Rate Route Frequency Ordered Stop   05/16/20 0600  cefoTEtan (CEFOTAN) 2 g in sodium chloride 0.9 % 100 mL IVPB        2 g 200 mL/hr over 30 Minutes Intravenous On call to O.R. 05/15/20 1039 05/16/20 0825   05/15/20 1000  piperacillin-tazobactam (ZOSYN) IVPB 3.375 g        3.375 g 12.5 mL/hr over 240 Minutes Intravenous Every 8 hours 05/15/20 0000 05/20/20 0938   05/15/20 0100  piperacillin-tazobactam (ZOSYN) IVPB 3.375 g        3.375 g 12.5 mL/hr over 240 Minutes Intravenous  Once 05/14/20 2357 05/15/20 1322      Assessment/Plan: Ms. Leaf is a 74  yo with perforated diverticulitis and end colostomy in place. Colostomy is functional and patient tolerating full liquids.  PRN for pain IS, OOB to chair; PT consult placed. HD with A fib  Advance diet to soft diet with Glucerna supplements  Ostomy RN teaching  Wound vac change MWF by Ostomy RN  Mild increase in white blood cell count; monitor and evaluate wound tomorrow with dressing change.  May also reflect pulmonary process as chest x-ray on December 24 showed no lung volumes with streaky bilateral airspace opacities. H&H improved with transfusion, acute post operative blood loss anemia related to Eliquis  SCDs, holding Eliquis with bleeding      LOS: 6 days    Fredirick Maudlin 05/21/2020

## 2020-05-21 NOTE — Progress Notes (Signed)
PROGRESS NOTE        PATIENT DETAILS Name: Ann [name] Age: 74 y.o. Sex: female Date of Birth: [birth year] Admit Date: 05/14/2020 Admitting Physician Evalee Mutton Kristeen Mans, MD [id number], Tesfaye, MD  Brief Narrative: Patient is a 74 y.o. female chronic diastolic heart failure, chronic atrial fibrillation, adrenal insufficiency, DM-2, COPD, CAD-s/p PCI-presented to the ED with 1 day history of left lower quadrant abdominal pain and hematochezia.  Further evaluation revealed sigmoid diverticulitis with perforation.  Hospital course complicated by acute blood loss anemia and hypotension-requiring transfer to the ICU on 12/20 for close monitoring.   S/p  perforated diverticulitis and end colostomy in place on 05/16/20.   Significant events: 12/19>> admit for sigmoid diverticulitis with perforation-hematochezia with acute blood loss anemia 12/20>> multiple episodes of hematochezia overnight-hypotension-transfer to ICU 12/21>>To OR-colectomy/colostomy  Significant studies: 12/19>> CT abdomen/pelvis: Sigmoid diverticulitis with contained perforation.  Antimicrobial therapy: Zosyn: 12/19>>  Microbiology data: 12/21>> intraoperative/intraperitoneal cultures: NGTD  Procedures : 12/21>> partial colectomy with end colostomy-Dr Constance Haw  Consults: GI, general surgery  DVT Prophylaxis : Place TED hose Start: 05/21/20 1606 SCDs Start: 05/14/20 1348 Place TED hose Start: 05/14/20 1348   Subjective: -Oral intake remains a challenge Advised family to bring liquid food items for patient to see if she will prefer that -Reluctant to get out of bed even with assistance -No emesis, some nausea  Assessment/Plan: Hypotension: Resolved multifactorial etiology-secondary to acute blood loss, sepsis-and relative adrenal insufficiency.  Improved after PRBC transfusion, IV fluid-patient and initialing of stress dose steroids. -c/n  hydrocortisone to 25 mg IV every 12 hours  and slowly taper to oral regimen -Consider switching back to p.o. prednisone in a.m.  Sepsis due to sigmoid diverticulitis with contained perforation: Sepsis physiology has improved-underwent colectomy with colostomy placement on 12/21-discussed with Dr. Constance Haw- --continue antimicrobial therapy with Zosyn through 05/19/2020 due to peritoneal contamination.    intraoperative cultures-NGTD -05/20/20--ostomy looks much less dusky, general surgeon recommends further observation, no need for ostomy revision at this time -Ostomy RN was teaching family ostomy care -Leukocytosis may be related to steroids  Lower GI bleeding/Hematochezia with acute blood loss anemia: Unusual for diverticular bleeding in the setting of diverticulitis-   S/p 2 units of PRBC transfusion-hemoglobin stable.  GI/general surgery following-she is s/p colectomy-probably does not require further work-up.  --Hemoglobin up to 9.8 after a total of 3 units of PRBC this admission   Follow surgical biopsies -Will need outpatient GI follow-up for endoluminal evaluation  Chronic atrial fibrillation with RVR: Rate slowly creeping up-likely due to pain at surgical site-Cardizem held on 12/20 due to hypotension-we will resume short acting Cardizem-once rate better controlled-we can switch her back to Cardizem. -Concerns about ongoing bleeding- Eliquis remains on hold  Chronic diastolic heart failure: Volume status stable-Lasix/other therapies on hold. -Be very judicious with IV fluids to avoid volume overload  CAD-history of prior PCI: No anginal symptoms-not on aspirin as patient on anticoagulation.  COPD: Stable-continue bronchodilators.  AKI: Mild-likely hemodynamically mediated-with some element of contrast-induced nephropathy (received contrast for CT abdomen on admission). -AKI has resolved , creatinine normalized - renally adjust medications, avoid nephrotoxic agents / dehydration  / hypotension  Relative adrenal  insufficiency: was on 10 mg of prednisone daily at home- -see above regarding hypotension-now on stress dose hydrocortisone- will gradually taper down to usual prednisone dosing over the next few days.   --  Change hydrocortisone to 25 mg IV every 12 hours and slowly taper to oral regimen -Consider switching to p.o. prednisone in a.m.  History of vitamin B12 deficiency: Continue supplementation  Hypothyroidism (TSH 1.22 on 8/12): On Synthroid  DM-2 (A1c 7.1 on 11/16): CBGs relatively stable- Increase Levemir to 18 units nightly from 15 units--due to persistent hyperglycemia -- SSI to every 4 hours.  Follow and adjust  Recent Labs    05/21/20 0508 05/21/20 0753 05/21/20 1054  GLUCAP 120* 154* 245*    Morbid Obesity: Estimated body mass index is 49.68 kg/m as calculated from the following:   Height as of this encounter: 5\' 3"  (1.6 m).   Weight as of this encounter: 127.2 kg.   --Generalized weakness and deconditioning--PT eval appreciated, recommends SNF rehab, patient is reluctant to consider SNF rehab -Patient states she may consider CIR  Diet: Diet Order            DIET SOFT Room service appropriate? Yes; Fluid consistency: Thin  Diet effective now                 Code Status: DNR  Family Communication: Spouse [name] [phone])-and son  Disposition Plan: Status [id number]  The patient will require care spanning > 2 midnights and should be moved to inpatient because: -Bowel function is returning, oral intake fair with some tolerance of liquid diet--Patient states she may consider CIR,    Dispo: The patient is from: Home              Anticipated d/c is to: SNF Vs Home with [place] vs -Patient states she may consider CIR              Anticipated d/c date is: > 3 days              Patient currently is not medically stable to d/c.  Barriers to Discharge:     Awaiting return of bowel function and tolerance of oral intake prior to discharge  Antimicrobial  agents: Anti-infectives (From admission, onward)   Start     Dose/Rate Route Frequency Ordered Stop   05/16/20 0600  cefoTEtan (CEFOTAN) 2 g in sodium chloride 0.9 % 100 mL IVPB        2 g 200 mL/hr over 30 Minutes Intravenous On call to O.R. 05/15/20 1039 05/16/20 0825   05/15/20 1000  piperacillin-tazobactam (ZOSYN) IVPB 3.375 g        3.375 g 12.5 mL/hr over 240 Minutes Intravenous Every 8 hours 05/15/20 0000 05/20/20 0938   05/15/20 0100  piperacillin-tazobactam (ZOSYN) IVPB 3.375 g        3.375 g 12.5 mL/hr over 240 Minutes Intravenous  Once 05/14/20 2357 05/15/20 1322      MEDICATIONS: Scheduled Meds: . atorvastatin  80 mg Oral q1800  . Chlorhexidine Gluconate Cloth  6 each Topical Daily  . cholecalciferol  5,000 Units Oral Daily  . darifenacin  7.5 mg Oral Daily  . diltiazem  30 mg Oral Q8H  . gabapentin  400 mg Oral TID  . hydrocortisone sod succinate (SOLU-CORTEF) inj  25 mg Intravenous Q12H  . insulin aspart  0-9 Units Subcutaneous Q4H  . insulin detemir  18 Units Subcutaneous Q2200  . levothyroxine  150 mcg Oral QAC breakfast  . mirabegron ER  50 mg Oral Daily  . pantoprazole  40 mg Oral Daily  . sertraline  50 mg Oral QHS  . sodium chloride flush  10-40 mL Intracatheter Q12H  . sodium  chloride flush  3 mL Intravenous Q12H  . sodium chloride flush  3 mL Intravenous Q12H  . umeclidinium bromide  1 puff Inhalation Daily  . cyanocobalamin  1,000 mcg Oral Daily   Continuous Infusions: . sodium chloride    . feeding supplement (GLUCERNA 1.2 CAL)    . lactated ringers 20 mL/hr at 05/20/20 1227  . sodium chloride Stopped (05/15/20 0945)   PRN Meds:.sodium chloride, acetaminophen **OR** acetaminophen, albuterol, metoprolol tartrate, nitroGLYCERIN, ondansetron **OR** ondansetron (ZOFRAN) IV, oxyCODONE, polyvinyl alcohol, sodium chloride, sodium chloride flush, sodium chloride flush, traZODone   PHYSICAL EXAM: Vital signs: Vitals:   05/21/20 0513 05/21/20 0515  05/21/20 0617 05/21/20 1557  BP: (!) 144/103 (!) 152/100  (!) 156/89  Pulse: 62 87  77  Resp: 16   18  Temp: 98.2 F (36.8 C)     TempSrc: Oral     SpO2: 98%  98% 97%  Weight:      Height:       Filed Weights   05/17/20 0448 05/18/20 0500 05/19/20 0500  Weight: 127.2 kg 127.2 kg 127.2 kg   Body mass index is 49.68 kg/m.   Phy Exam- Gen Exam:Alert awake-morbidly obese, not in any distress HEENT:atraumatic, normocephalic Chest: B/L clear to auscultation anteriorly CVS:S1S2 irregular Abdomen: Soft-appropriately tender, +bs, -Ostomy bag with semi-liquid stool  Extremities:no edema, good pedal pulses Neurology: Generalized weakness, Non focal Skin: no rash, warm and dry  LABORATORY DATA: CBC: Recent Labs  Lab 05/16/20 1241 05/17/20 0442 05/18/20 0438 05/19/20 0421 05/20/20 0729  WBC 20.4* 13.6* 9.1 10.0 11.9*  HGB 9.8* 8.8* 7.1* 9.5* 9.8*  HCT 34.0* 31.5* 25.0* 32.5* 34.3*  MCV 85.6 86.1 87.1 86.2 86.2  PLT 227 184 165 179 [id number]    Basic Metabolic Panel: Recent Labs  Lab 05/15/20 2045 05/16/20 0444 05/17/20 0442 05/18/20 0438 05/19/20 0421 05/20/20 0729  NA 137 140 140 138 140 140  K 4.0 3.6 4.2 3.7 3.1* 4.4  CL 101 103 105 103 100 101  CO2 25 28 26 24 31  33*  GLUCOSE 143* 138* 141* 106* 114* 154*  BUN 27* 25* 29* 18 13 12   CREATININE 1.31* 1.05* 1.05* 0.68 0.65 0.54  CALCIUM 7.4* 7.6* 7.9* 8.1* 8.6* 8.9  MG 1.4*  --   --  1.8  --  1.7    GFR: Estimated Creatinine Clearance: 80.2 mL/min (by C-G formula based on SCr of 0.54 mg/dL).  Liver Function Tests: Recent Labs  Lab 05/16/20 0444 05/17/20 0442 05/18/20 0438  AST 20 34 35  ALT 13 16 17   ALKPHOS 49 46 39  BILITOT 0.6 0.5 0.5  PROT 6.2* 5.8* 5.2*  ALBUMIN 3.0* 2.6* 2.5*   No results for input(s): LIPASE, AMYLASE in the last 168 hours. No results for input(s): AMMONIA in the last 168 hours.  Coagulation Profile: No results for input(s): INR, PROTIME in the last 168 hours.  Cardiac  Enzymes: No results for input(s): CKTOTAL, CKMB, CKMBINDEX, TROPONINI in the last 168 hours.  BNP (last 3 results) No results for input(s): PROBNP in the last 8760 hours.  Lipid Profile: No results for input(s): CHOL, HDL, LDLCALC, TRIG, CHOLHDL, LDLDIRECT in the last 72 hours.  Thyroid Function Tests: No results for input(s): TSH, T4TOTAL, FREET4, T3FREE, THYROIDAB in the last 72 hours.  Anemia Panel: No results for input(s): VITAMINB12, FOLATE, FERRITIN, TIBC, IRON, RETICCTPCT in the last 72 hours.  Urine analysis:    Component Value Date/Time   COLORURINE YELLOW 01/23/2020 1819  APPEARANCEUR CLEAR 01/23/2020 1819   LABSPEC 1.012 01/23/2020 1819   PHURINE 5.0 01/23/2020 1819   GLUCOSEU NEGATIVE 01/23/2020 1819   HGBUR NEGATIVE 01/23/2020 1819   BILIRUBINUR NEGATIVE 01/23/2020 1819   KETONESUR NEGATIVE 01/23/2020 1819   PROTEINUR NEGATIVE 01/23/2020 1819   UROBILINOGEN 0.2 03/15/2013 1504   NITRITE NEGATIVE 01/23/2020 1819   LEUKOCYTESUR NEGATIVE 01/23/2020 1819   Sepsis Labs: Lactic Acid, Venous    Component Value Date/Time   LATICACIDVEN 1.2 05/15/2020 0345   MICROBIOLOGY: Recent Results (from the past 240 hour(s))  Resp Panel by RT-PCR (Flu A&B, Covid) Nasopharyngeal Swab     Status: None   Collection Time: 05/14/20 12:05 PM   Specimen: Nasopharyngeal Swab; Nasopharyngeal(NP) swabs in vial transport medium  Result Value Ref Range Status   SARS Coronavirus 2 by RT PCR NEGATIVE NEGATIVE Final    Comment: (NOTE) SARS-CoV-2 target nucleic acids are NOT DETECTED.  The SARS-CoV-2 RNA is generally detectable in upper respiratory specimens during the acute phase of infection. The lowest concentration of SARS-CoV-2 viral copies this assay can detect is 138 copies/mL. A negative result does not preclude SARS-Cov-2 infection and should not be used as the sole basis for treatment or other patient management decisions. A negative result may occur with  improper specimen  collection/handling, submission of specimen other than nasopharyngeal swab, presence of viral mutation(s) within the areas targeted by this assay, and inadequate number of viral copies(<138 copies/mL). A negative result must be combined with clinical observations, patient history, and epidemiological information. The expected result is Negative.  Fact Sheet for Patients:  EntrepreneurPulse.com.au  Fact Sheet for Healthcare Providers:  IncredibleEmployment.be  This test is no t yet approved or cleared by the [place] FDA and  has been authorized for detection and/or diagnosis of SARS-CoV-2 by FDA under an Emergency Use Authorization (EUA). This EUA will remain  in effect (meaning this test can be used) for the duration of the COVID-19 declaration under Section 564(b)(1) of the Act, 21 U.S.C.section 360bbb-3(b)(1), unless the authorization is terminated  or revoked sooner.       Influenza A by PCR NEGATIVE NEGATIVE Final   Influenza B by PCR NEGATIVE NEGATIVE Final    Comment: (NOTE) The Xpert Xpress SARS-CoV-2/FLU/RSV plus assay is intended as an aid in the diagnosis of influenza from Nasopharyngeal swab specimens and should not be used as a sole basis for treatment. Nasal washings and aspirates are unacceptable for Xpert Xpress SARS-CoV-2/FLU/RSV testing.  Fact Sheet for Patients: EntrepreneurPulse.com.au  Fact Sheet for Healthcare Providers: IncredibleEmployment.be  This test is not yet approved or cleared by the [place] FDA and has been authorized for detection and/or diagnosis of SARS-CoV-2 by FDA under an Emergency Use Authorization (EUA). This EUA will remain in effect (meaning this test can be used) for the duration of the COVID-19 declaration under Section 564(b)(1) of the Act, 21 U.S.C. section 360bbb-3(b)(1), unless the authorization is terminated or revoked.  Performed at [facility], [address], [place], [place] [address]   MRSA PCR Screening     Status: None   Collection Time: 05/15/20  8:51 AM   Specimen: Nasal Mucosa; Nasopharyngeal  Result Value Ref Range Status   MRSA by PCR NEGATIVE NEGATIVE Final    Comment:        The GeneXpert MRSA Assay (FDA approved for NASAL specimens only), is one component of a comprehensive MRSA colonization surveillance program. It is not intended to diagnose MRSA infection nor to guide or monitor treatment  for MRSA infections. Performed at [facility], [address] [place], [place] [address]   Culture, blood (routine x 2)     Status: None   Collection Time: 05/15/20 10:26 AM   Specimen: BLOOD RIGHT HAND  Result Value Ref Range Status   Specimen Description BLOOD RIGHT HAND  Final   Special Requests   Final    BOTTLES DRAWN AEROBIC AND ANAEROBIC Blood Culture results may not be optimal due to an inadequate volume of blood received in culture bottles   Culture   Final    NO GROWTH 5 DAYS Performed at [facility], [address], [place], [place] [address]    Report Status 05/20/2020 FINAL  Final  Culture, blood (routine x 2)     Status: None   Collection Time: 05/15/20 10:31 AM   Specimen: BLOOD LEFT HAND  Result Value Ref Range Status   Specimen Description BLOOD LEFT HAND  Final   Special Requests   Final    BOTTLES DRAWN AEROBIC AND ANAEROBIC Blood Culture adequate volume   Culture   Final    NO GROWTH 5 DAYS Performed at [facility], [address] [place], [place] [address]    Report Status 05/20/2020 FINAL  Final  Aerobic/Anaerobic Culture (surgical/deep wound)     Status: None (Preliminary result)   Collection Time: 05/16/20 10:09 AM   Specimen: PATH Cytology Peritoneal fluid; Body Fluid  Result Value Ref Range Status   Specimen Description   Final    PERITONEAL Performed at [facility], [address], [place], [place] [address]    Special Requests   Final    NONE Performed at [facility], [address] [place], [place] [address]    Gram Stain   Final    FEW WBC PRESENT,BOTH PMN AND MONONUCLEAR RARE GRAM VARIABLE ROD    Culture   Final    ABUNDANT BACTEROIDES FRAGILIS BETA LACTAMASE POSITIVE RARE ENTEROCOCCUS FAECALIS SUSCEPTIBILITIES TO FOLLOW Performed at [facility], [place] [address] [place], [place] [address]    Report Status PENDING  Incomplete    RADIOLOGY STUDIES/RESULTS: No results found.  LOS: 6 days   Roxan Hockey, MD  [facility]  To contact the attending provider between 7A-7P or the covering provider during after hours 7P-7A, please log into the web site [email or web address] and access using universal [place] password for that web site. If you do not have the password, please call the hospital operator.  05/21/2020, 4:05 PM

## 2020-05-22 LAB — GLUCOSE, CAPILLARY
Glucose-Capillary: 111 mg/dL — ABNORMAL HIGH (ref 70–99)
Glucose-Capillary: 126 mg/dL — ABNORMAL HIGH (ref 70–99)
Glucose-Capillary: 132 mg/dL — ABNORMAL HIGH (ref 70–99)
Glucose-Capillary: 135 mg/dL — ABNORMAL HIGH (ref 70–99)
Glucose-Capillary: 148 mg/dL — ABNORMAL HIGH (ref 70–99)
Glucose-Capillary: 187 mg/dL — ABNORMAL HIGH (ref 70–99)

## 2020-05-22 LAB — BASIC METABOLIC PANEL
Anion gap: 9 (ref 5–15)
BUN: 12 mg/dL (ref 8–23)
CO2: 33 mmol/L — ABNORMAL HIGH (ref 22–32)
Calcium: 8.6 mg/dL — ABNORMAL LOW (ref 8.9–10.3)
Chloride: 97 mmol/L — ABNORMAL LOW (ref 98–111)
Creatinine, Ser: 0.56 mg/dL (ref 0.44–1.00)
GFR, Estimated: >60 mL/min (ref >60)
Glucose, Bld: 121 mg/dL — ABNORMAL HIGH (ref 70–99)
Potassium: 3.5 mmol/L (ref 3.5–5.1)
Sodium: 139 mmol/L (ref 135–145)

## 2020-05-22 LAB — CBC
HCT: 33.2 % — ABNORMAL LOW (ref 36.0–46.0)
Hemoglobin: 9.6 g/dL — ABNORMAL LOW (ref 12.0–15.0)
MCH: 25.1 pg — ABNORMAL LOW (ref 26.0–34.0)
MCHC: 28.9 g/dL — ABNORMAL LOW (ref 30.0–36.0)
MCV: 86.7 fL (ref 80.0–100.0)
Platelets: 227 10*3/uL (ref 150–400)
RBC: 3.83 MIL/uL — ABNORMAL LOW (ref 3.87–5.11)
RDW: 17.4 % — ABNORMAL HIGH (ref 11.5–15.5)
WBC: 9.5 10*3/uL (ref 4.0–10.5)
nRBC: 0 % (ref 0.0–0.2)

## 2020-05-22 MED ORDER — PREDNISONE 10 MG PO TABS
10.0000 mg | ORAL_TABLET | Freq: Every day | ORAL | Status: DC
  Administered 2020-05-23 – 2020-05-25 (×3): 10 mg via ORAL
  Filled 2020-05-22 (×3): qty 1

## 2020-05-22 MED ORDER — POTASSIUM CHLORIDE CRYS ER 20 MEQ PO TBCR
40.0000 meq | EXTENDED_RELEASE_TABLET | Freq: Once | ORAL | Status: AC
  Administered 2020-05-22: 09:00:00 40 meq via ORAL
  Filled 2020-05-22: qty 2

## 2020-05-22 NOTE — Progress Notes (Signed)
Occupational Therapy Treatment Patient Details Name: RICK WARNICK MRN: [id number] DOB: [birth year] Today's Date: 05/22/2020    History of present illness Laneya Gasaway  is a 74 y.o. female, s/p colostomy on 05/16/20 due to acute GI bleeding, who is a reformed smoker with past medical history relevant for CAD with prior stents, prior history of diverticulosis/diverticulitis, GERD and erosive gastritis, chronic atrial fibrillation on anticoagulation with Eliquis,  history of DVT , history of adrenal insufficiency,hypothyroidism, COPD, type 2 diabetes on insulin, and morbid obesity as well as hypertension presents to the ED with abdominal pain of about 24 hours duration it is generalized patient rated at about 9/10 associated with chills and nausea but no emesis   OT comments  Pt agreeable to OT session this am. Pt performing ADLs while seated at EOB and in chair. Set-up to min assist for UB tasks, max assist for LB tasks. Pt performing bed mobility with min assist and functional transfers with min guard today. Pt commenting "my husband won't be able to help me", when questioned about who would be able to help her pt reports her son. Level of assistance available at home is unclear, pt continues to refuse SNF however reports she is interested and would consider CIR. Update discharge recommendation to CIR as pt will benefit from skilled rehab services to improve safety and independence prior to returning home.    Follow Up Recommendations  CIR;Home health OT    Equipment Recommendations  None recommended by OT       Precautions / Restrictions Precautions Precautions: Fall Restrictions Weight Bearing Restrictions: No       Mobility Bed Mobility Overal bed mobility: Needs Assistance Bed Mobility: Supine to Sit     Supine to sit: Min assist     General bed mobility comments: increased time, labored movement, required HOB slightly raised  Transfers Overall transfer level: Needs  assistance Equipment used: Rolling walker (2 wheeled) Transfers: Sit to/from [employer] Sit to Stand: Min guard Stand pivot transfers: Min guard                ADL either performed or assessed with clinical judgement   ADL Overall ADL's : Needs assistance/impaired     Grooming: Wash/dry face;Brushing hair;Set up;Minimal assistance;Sitting Grooming Details (indicate cue type and reason): set-up for washing face/hands while seated in chair. Assist required for brushing hair due to BUE fatigue while reaching up overhead Upper Body Bathing: Moderate assistance;Sitting Upper Body Bathing Details (indicate cue type and reason): Assist for back, pt able to sit at EOB for task Lower Body Bathing: Maximal assistance;Sitting/lateral leans;Sit to/from stand Lower Body Bathing Details (indicate cue type and reason): pt with difficulty bending at waist s/p colostomy, requiring increased assistance for LB bathing Upper Body Dressing : Minimal assistance;Sitting Upper Body Dressing Details (indicate cue type and reason): Assist for gown buttons/ties Lower Body Dressing: Maximal assistance;Sitting/lateral leans Lower Body Dressing Details (indicate cue type and reason): pt unable to bend at waist or bring LE to opposite knee for donning socks, Max assist while seated at EOB Toilet Transfer: Min Probation officer Details (indicate cue type and reason): simulated with bed to chair transfer           General ADL Comments: Pt requiring increased assistance for ADLs due to weakness and pain post colostomy               Cognition Arousal/Alertness: Awake/alert Behavior During Therapy: WFL for tasks assessed/performed Overall Cognitive Status: Within Functional  Limits for tasks assessed                                                     Pertinent Vitals/ Pain       Pain Assessment: No/denies pain         Frequency  Min  2X/week        Progress Toward Goals  OT Goals(current goals can now be found in the care plan section)  Progress towards OT goals: Progressing toward goals  Acute Rehab OT Goals Patient Stated Goal: return home with assistance OT Goal Formulation: With patient Time For Goal Achievement: 05/31/20 Potential to Achieve Goals: Good ADL Goals Pt Will Perform Grooming: with modified independence;sitting;standing Pt Will Perform Upper Body Dressing: with supervision;sitting Pt Will Perform Lower Body Dressing: with supervision;sitting/lateral leans;sit to/from stand Pt Will Transfer to Toilet: with supervision;stand pivot transfer;bedside commode;ambulating;regular height toilet Pt Will Perform Toileting - Clothing Manipulation and hygiene: with supervision;sitting/lateral leans;sit to/from stand Pt/caregiver will Perform Home Exercise Program: Increased strength;Both right and left upper extremity;Independently;With written HEP provided  Plan Discharge plan needs to be updated          End of Session Equipment Utilized During Treatment: Gait belt;Rolling walker;Oxygen  OT Visit Diagnosis: Muscle weakness (generalized) (M62.81);Pain Pain - part of body:  (abdomen)   Activity Tolerance Patient tolerated treatment well   Patient Left in chair;with call bell/phone within reach;with chair alarm set   Nurse Communication          Time: [id number] OT Time Calculation (min): 29 min  Charges: OT General Charges $OT Visit: 1 Visit OT Treatments $Self Care/Home Management : 23-37 mins  Guadelupe Sabin, OTR/L  [phone] 05/22/2020, 8:25 AM

## 2020-05-22 NOTE — TOC Progression Note (Signed)
Transition of Care [facility]) - Progression Note    Patient Details  Name: JNAYA BUTRICK MRN: [id number] Date of Birth: 24-Dec-1945  Transition of Care [facility]) CM/SW Contact  Leitha Bleak, RN Phone Number: 05/22/2020, 2:53 PM  Clinical Narrative:   PT/OT recommended CIR, patient and spouse is agreeing. Referral made, patient will be accepted, currently no available beds. Plan for next 2-3 days to transfer. MD and RN updated.     Expected Discharge Plan: Home w Home Health Services Barriers to Discharge: Other (comment) (CIR Bed)  Expected Discharge Plan and Services Expected Discharge Plan: Home w Home Health Services     Post Acute Care Choice: Home Health Living arrangements for the past 2 months: Single Family Home                   Readmission Risk Interventions Readmission Risk Prevention Plan 05/16/2020 01/24/2020  Transportation Screening Complete Complete  HRI or Home Care Consult - Complete  Social Work Consult for Recovery Care Planning/Counseling - Complete  Palliative Care Screening - Not Applicable  Medication Review Oceanographer) Complete Complete  HRI or Home Care Consult Complete -  SW Recovery Care/Counseling Consult Complete -  Palliative Care Screening Not Applicable -  Skilled Nursing Facility Patient Refused -  Some recent data might be hidden

## 2020-05-22 NOTE — Progress Notes (Signed)
Inpatient Rehab Admissions Coordinator:   Inpatient rehab consult received.  I spoke to the patient over the phone to discuss goals and expectations of CIR admit.  We discussed supervision to mod I level goals, household level, and expected length of stay about 10-14 days.  Pt agreeable.  We discussed Medicare benefits as well as support at home (24/7 supervision confirmed).  I have no beds available for this patient to admit to CIR today.  Will continue to follow for timing of potential admission pending bed availability.   Estill Dooms, PT, DPT Admissions Coordinator [phone] 05/22/20  3:24 PM

## 2020-05-22 NOTE — Plan of Care (Signed)

## 2020-05-22 NOTE — Progress Notes (Signed)
[facility]  Ostomy making stool and gas. Much better looking. Just superficial mucosa dark but otherwise red and budded with some swelling.   Vac and ostomy changed by the ostomy RN. Plans for rehab per the patient.  Diet as tolerated. Continue wound care and ostomy care.   Algis Greenhouse, MD [facility] [address] Vella Raring [place], [place] [address] [phone] (office)

## 2020-05-22 NOTE — Consult Note (Signed)
WOC Nurse wound follow up  NPWT dressing change and LLQ ostomy care.  LEft message with spouse to attend teaching today and he is not here.  Patient states that her son will be performing care and the spouse will not.  She states she has agreed to go to Rehab at discharge as well.   Wound type: surgical  Pressure Injury POA: Yes Measurement: 15cm x 7cm x 7.5cm Wound YDX:AJOINOMVEHMC tissue; red; moist Drainage (amount, consistency, odor) serosanguinous  Periwound: intact  Dressing procedure/placement/frequency: Removed old NPWT dressing Filled wound with 1 piece of black foam Sealed NPWT dressing at HG Patient received PO pain medication per bedside nurse prior to dressing change Patient tolerated procedure well  WOC Nurse ostomy follow up Stoma type/location: LLQ, end colostomy  Stomal assessment/size: 1 3/4" slightly budded, red, remains dark (surgeon aware) Peristomal assessment: intact  Treatment options for stomal/peristomal skin: 2" skin barrier Output serosanguinous effluent- some purulence noted.  Scant stool present.  Patient is finishing lunch now.  No nausea noted.  Ostomy pouching:  2pc. 2 3/4" 2" skin barrier Pouch change performed.  Patient states her spouse will not participate in care.  Son nor spouse is here today despite phone message left. Plan is to discharge to Rehab when insurance is approved.  Will follow. Maple Hudson MSN, RN, FNP-BC CWON Wound, Ostomy, Continence Nurse Pager [phone]
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
## 2020-05-22 NOTE — Progress Notes (Signed)
PROGRESS NOTE        PATIENT DETAILS Name: Ann [name] Age: 74 y.o. Sex: female Date of Birth: [birth year] Admit Date: 05/14/2020 Admitting Physician Evalee Mutton Kristeen Mans, MD [id number], Tesfaye, MD  Brief Narrative: Patient is a 73 y.o. female chronic diastolic heart failure, chronic atrial fibrillation, adrenal insufficiency, DM-2, COPD, CAD-s/p PCI-presented to the ED with 1 day history of left lower quadrant abdominal pain and hematochezia.  Further evaluation revealed sigmoid diverticulitis with perforation.  Hospital course complicated by acute blood loss anemia and hypotension-requiring transfer to the ICU on 12/20 for close monitoring.   S/p  perforated diverticulitis and end colostomy in place on 05/16/20.   Significant events: 12/19>> admit for sigmoid diverticulitis with perforation-hematochezia with acute blood loss anemia 12/20>> multiple episodes of hematochezia overnight-hypotension-transfer to ICU 12/21>>To OR-colectomy/colostomy  Significant studies: 12/19>> CT abdomen/pelvis: Sigmoid diverticulitis with contained perforation.  Antimicrobial therapy: Zosyn: 12/19>>  Microbiology data: 12/21>> intraoperative/intraperitoneal cultures: NGTD  Procedures : 12/21>> partial colectomy with end colostomy-Dr Constance Haw  Consults: GI, general surgery  DVT Prophylaxis : Place TED hose Start: 05/21/20 1606 SCDs Start: 05/14/20 1348 Place TED hose Start: 05/14/20 1348   Subjective: -Oral intake is fair Vac and ostomy changed by the ostomy RN on 05/22/2020 No fever  Or chills     Assessment/Plan: Hypotension: Resolved multifactorial etiology-secondary to acute blood loss, sepsis-and relative adrenal insufficiency.  Improved after PRBC transfusion, IV fluid-patient and initialing of stress dose steroids. -Stop IV hydrocortisone on 05/22/2020 -Okay to start p.o. prednisone in a.m of 05/23/2020  Sepsis due to sigmoid diverticulitis with  contained perforation: Sepsis physiology has improved-underwent colectomy with colostomy placement on 12/21-discussed with Dr. Constance Haw- --continue antimicrobial therapy with Zosyn through 05/19/2020 due to peritoneal contamination.    intraoperative cultures-NGTD -05/22/20--Vac and ostomy changed by the ostomy RN  Lower GI bleeding/Hematochezia with acute blood loss anemia: Unusual for diverticular bleeding in the setting of diverticulitis-   S/p 2 units of PRBC transfusion-hemoglobin stable.  GI/general surgery following-she is s/p colectomy-probably does not require further work-up.  --Hemoglobin up to 9.6 after a total of 3 units of PRBC this admission   Follow surgical biopsies -Will need outpatient GI follow-up for endoluminal evaluation  Chronic atrial fibrillation with RVR: Rate slowly creeping up-likely due to pain at surgical site-Cardizem held on 12/20 due to hypotension-we will resume short acting Cardizem-once rate better controlled-we can switch her back to Cardizem. -Concerns about ongoing bleeding- Eliquis remains on hold  Chronic diastolic heart failure: Volume status stable-Lasix/other therapies on hold. -Be very judicious with IV fluids to avoid volume overload  CAD-history of prior PCI: No anginal symptoms-not on aspirin as patient on anticoagulation.  COPD: Stable-continue bronchodilators.  AKI: Mild-likely hemodynamically mediated-with some element of contrast-induced nephropathy (received contrast for CT abdomen on admission). -AKI has resolved , creatinine normalized - renally adjust medications, avoid nephrotoxic agents / dehydration  / hypotension  Relative adrenal insufficiency: was on 10 mg of prednisone daily at home- -see above regarding hypotension-now on stress dose hydrocortisone- will gradually taper down to usual prednisone dosing over the next few days.   -Stop IV hydrocortisone on 05/22/2020 -Okay to start p.o. prednisone in a.m of 05/23/2020  History  of vitamin B12 deficiency: Continue supplementation  Hypothyroidism (TSH 1.22 on 8/12): On Synthroid  DM-2 (A1c 7.1 on 11/16): CBGs relatively stable- Continue Levemir 18 units nightly  from 15 units--due to persistent hyperglycemia -- SSI to every 4 hours.  Follow and adjust  Recent Labs    05/22/20 0727 05/22/20 1107 05/22/20 1607  GLUCAP 135* 187* 126*    Morbid Obesity: Estimated body mass index is 49.68 kg/m as calculated from the following:   Height as of this encounter: 5\' 3"  (1.6 m).   Weight as of this encounter: 127.2 kg.   --Generalized weakness and deconditioning--PT eval appreciated, recommends SNF rehab, patient is reluctant to consider SNF rehab Patient and husband are agreeable to transfer to CIR for ongoing rehab  Diet: Diet Order            DIET SOFT Room service appropriate? Yes; Fluid consistency: Thin  Diet effective now                 Code Status: DNR  Family Communication: Spouse Baldo Ash [phone])-and son  Disposition Plan: Status HW:[id number]  The patient will require care spanning > 2 midnights and should be moved to inpatient because: -Bowel function is returning, oral intake fair-- awaiting transfer to CIR,    Dispo: The patient is from: Home              Anticipated d/c is to: Awaiting transfer to CIR              Anticipated d/c date is: 1 day              Patient currently is medically stable to d/c.  Barriers to Discharge:     Awaiting availability of bed for transfer to CIR  Antimicrobial agents: Anti-infectives (From admission, onward)   Start     Dose/Rate Route Frequency Ordered Stop   05/16/20 0600  cefoTEtan (CEFOTAN) 2 g in sodium chloride 0.9 % 100 mL IVPB        2 g 200 mL/hr over 30 Minutes Intravenous On call to O.R. 05/15/20 1039 05/16/20 0825   05/15/20 1000  piperacillin-tazobactam (ZOSYN) IVPB 3.375 g        3.375 g 12.5 mL/hr over 240 Minutes Intravenous Every 8 hours 05/15/20 0000 05/20/20 0938   05/15/20  0100  piperacillin-tazobactam (ZOSYN) IVPB 3.375 g        3.375 g 12.5 mL/hr over 240 Minutes Intravenous  Once 05/14/20 2357 05/15/20 1322      MEDICATIONS: Scheduled Meds: . atorvastatin  80 mg Oral q1800  . Chlorhexidine Gluconate Cloth  6 each Topical Daily  . cholecalciferol  5,000 Units Oral Daily  . darifenacin  7.5 mg Oral Daily  . diltiazem  30 mg Oral Q8H  . gabapentin  400 mg Oral TID  . hydrocortisone sod succinate (SOLU-CORTEF) inj  25 mg Intravenous Q12H  . insulin aspart  0-9 Units Subcutaneous Q4H  . insulin detemir  18 Units Subcutaneous Q2200  . levothyroxine  150 mcg Oral QAC breakfast  . mirabegron ER  50 mg Oral Daily  . pantoprazole  40 mg Oral Daily  . sertraline  50 mg Oral QHS  . sodium chloride flush  10-40 mL Intracatheter Q12H  . sodium chloride flush  3 mL Intravenous Q12H  . sodium chloride flush  3 mL Intravenous Q12H  . umeclidinium bromide  1 puff Inhalation Daily  . cyanocobalamin  1,000 mcg Oral Daily   Continuous Infusions: . sodium chloride    . feeding supplement (GLUCERNA 1.2 CAL)    . lactated ringers 20 mL/hr at 05/20/20 1227  . sodium chloride Stopped (05/15/20 0945)  PRN Meds:.sodium chloride, acetaminophen **OR** acetaminophen, albuterol, metoprolol tartrate, nitroGLYCERIN, ondansetron **OR** ondansetron (ZOFRAN) IV, oxyCODONE, polyvinyl alcohol, sodium chloride, sodium chloride flush, sodium chloride flush, traZODone   PHYSICAL EXAM: Vital signs: Vitals:   05/21/20 1958 05/22/20 0521 05/22/20 0845 05/22/20 1434  BP: 122/81 133/71  (!) 114/37  Pulse: 63 76  (!) 102  Resp: 20 20  18   Temp: 98 F (36.7 C) 97.8 F (36.6 C)  98.7 F (37.1 C)  TempSrc: Oral Oral  Oral  SpO2: 95% 97% 90% 98%  Weight:      Height:       Filed Weights   05/17/20 0448 05/18/20 0500 05/19/20 0500  Weight: 127.2 kg 127.2 kg 127.2 kg   Body mass index is 49.68 kg/m.   Phy Exam- Gen Exam:Alert awake-morbidly obese, not in any  distress HEENT:atraumatic, normocephalic Chest: B/L clear to auscultation anteriorly CVS:S1S2 irregular Abdomen: Soft-appropriately tender, +bs, -Ostomy bag with semi-liquid stool  Extremities:no edema, good pedal pulses Neurology: Generalized weakness, Non focal Skin: no rash, warm and dry  LABORATORY DATA: CBC: Recent Labs  Lab 05/17/20 0442 05/18/20 0438 05/19/20 0421 05/20/20 0729 05/22/20 0548  WBC 13.6* 9.1 10.0 11.9* 9.5  HGB 8.8* 7.1* 9.5* 9.8* 9.6*  HCT 31.5* 25.0* 32.5* 34.3* 33.2*  MCV 86.1 87.1 86.2 86.2 86.7  PLT 184 165 179 189 [id number]    Basic Metabolic Panel: Recent Labs  Lab 05/15/20 2045 05/16/20 0444 05/17/20 0442 05/18/20 0438 05/19/20 0421 05/20/20 0729 05/22/20 0548  NA 137   < > 140 138 140 140 139  K 4.0   < > 4.2 3.7 3.1* 4.4 3.5  CL 101   < > 105 103 100 101 97*  CO2 25   < > 26 24 31  33* 33*  GLUCOSE 143*   < > 141* 106* 114* 154* 121*  BUN 27*   < > 29* 18 13 12 12   CREATININE 1.31*   < > 1.05* 0.68 0.65 0.54 0.56  CALCIUM 7.4*   < > 7.9* 8.1* 8.6* 8.9 8.6*  MG 1.4*  --   --  1.8  --  1.7  --    < > = values in this interval not displayed.    GFR: Estimated Creatinine Clearance: 80.2 mL/min (by C-G formula based on SCr of 0.56 mg/dL).  Liver Function Tests: Recent Labs  Lab 05/16/20 0444 05/17/20 0442 05/18/20 0438  AST 20 34 35  ALT 13 16 17   ALKPHOS 49 46 39  BILITOT 0.6 0.5 0.5  PROT 6.2* 5.8* 5.2*  ALBUMIN 3.0* 2.6* 2.5*   No results for input(s): LIPASE, AMYLASE in the last 168 hours. No results for input(s): AMMONIA in the last 168 hours.  Coagulation Profile: No results for input(s): INR, PROTIME in the last 168 hours.  Cardiac Enzymes: No results for input(s): CKTOTAL, CKMB, CKMBINDEX, TROPONINI in the last 168 hours.  BNP (last 3 results) No results for input(s): PROBNP in the last 8760 hours.  Lipid Profile: No results for input(s): CHOL, HDL, LDLCALC, TRIG, CHOLHDL, LDLDIRECT in the last 72  hours.  Thyroid Function Tests: No results for input(s): TSH, T4TOTAL, FREET4, T3FREE, THYROIDAB in the last 72 hours.  Anemia Panel: No results for input(s): VITAMINB12, FOLATE, FERRITIN, TIBC, IRON, RETICCTPCT in the last 72 hours.  Urine analysis:    Component Value Date/Time   COLORURINE YELLOW 01/23/2020 1819   APPEARANCEUR CLEAR 01/23/2020 1819   LABSPEC 1.012 01/23/2020 1819   PHURINE 5.0 01/23/2020 1819  GLUCOSEU NEGATIVE 01/23/2020 1819   HGBUR NEGATIVE 01/23/2020 1819   BILIRUBINUR NEGATIVE 01/23/2020 1819   KETONESUR NEGATIVE 01/23/2020 1819   PROTEINUR NEGATIVE 01/23/2020 1819   UROBILINOGEN 0.2 03/15/2013 1504   NITRITE NEGATIVE 01/23/2020 1819   LEUKOCYTESUR NEGATIVE 01/23/2020 1819   Sepsis Labs: Lactic Acid, Venous    Component Value Date/Time   LATICACIDVEN 1.2 05/15/2020 0345   MICROBIOLOGY: Recent Results (from the past 240 hour(s))  Resp Panel by RT-PCR (Flu A&B, Covid) Nasopharyngeal Swab     Status: None   Collection Time: 05/14/20 12:05 PM   Specimen: Nasopharyngeal Swab; Nasopharyngeal(NP) swabs in vial transport medium  Result Value Ref Range Status   SARS Coronavirus 2 by RT PCR NEGATIVE NEGATIVE Final    Comment: (NOTE) SARS-CoV-2 target nucleic acids are NOT DETECTED.  The SARS-CoV-2 RNA is generally detectable in upper respiratory specimens during the acute phase of infection. The lowest concentration of SARS-CoV-2 viral copies this assay can detect is 138 copies/mL. A negative result does not preclude SARS-Cov-2 infection and should not be used as the sole basis for treatment or other patient management decisions. A negative result may occur with  improper specimen collection/handling, submission of specimen other than nasopharyngeal swab, presence of viral mutation(s) within the areas targeted by this assay, and inadequate number of viral copies(<138 copies/mL). A negative result must be combined with clinical observations, patient  history, and epidemiological information. The expected result is Negative.  Fact Sheet for Patients:  EntrepreneurPulse.com.au  Fact Sheet for Healthcare Providers:  IncredibleEmployment.be  This test is no t yet approved or cleared by the [place] FDA and  has been authorized for detection and/or diagnosis of SARS-CoV-2 by FDA under an Emergency Use Authorization (EUA). This EUA will remain  in effect (meaning this test can be used) for the duration of the COVID-19 declaration under Section 564(b)(1) of the Act, 21 U.S.C.section 360bbb-3(b)(1), unless the authorization is terminated  or revoked sooner.       Influenza A by PCR NEGATIVE NEGATIVE Final   Influenza B by PCR NEGATIVE NEGATIVE Final    Comment: (NOTE) The Xpert Xpress SARS-CoV-2/FLU/RSV plus assay is intended as an aid in the diagnosis of influenza from Nasopharyngeal swab specimens and should not be used as a sole basis for treatment. Nasal washings and aspirates are unacceptable for Xpert Xpress SARS-CoV-2/FLU/RSV testing.  Fact Sheet for Patients: EntrepreneurPulse.com.au  Fact Sheet for Healthcare Providers: IncredibleEmployment.be  This test is not yet approved or cleared by the [place] FDA and has been authorized for detection and/or diagnosis of SARS-CoV-2 by FDA under an Emergency Use Authorization (EUA). This EUA will remain in effect (meaning this test can be used) for the duration of the COVID-19 declaration under Section 564(b)(1) of the Act, 21 U.S.C. section 360bbb-3(b)(1), unless the authorization is terminated or revoked.  Performed at [facility], [address], [place], [place] [address]   MRSA PCR Screening     Status: None   Collection Time: 05/15/20  8:51 AM   Specimen: Nasal Mucosa; Nasopharyngeal  Result Value Ref Range Status   MRSA by PCR NEGATIVE NEGATIVE Final    Comment:        The GeneXpert MRSA  Assay (FDA approved for NASAL specimens only), is one component of a comprehensive MRSA colonization surveillance program. It is not intended to diagnose MRSA infection nor to guide or monitor treatment for MRSA infections. Performed at [facility], [address] [place], [place] [address]   Culture, blood (  routine x 2)     Status: None   Collection Time: 05/15/20 10:26 AM   Specimen: BLOOD RIGHT HAND  Result Value Ref Range Status   Specimen Description BLOOD RIGHT HAND  Final   Special Requests   Final    BOTTLES DRAWN AEROBIC AND ANAEROBIC Blood Culture results may not be optimal due to an inadequate volume of blood received in culture bottles   Culture   Final    NO GROWTH 5 DAYS Performed at [facility], [address], [place], [place] [address]    Report Status 05/20/2020 FINAL  Final  Culture, blood (routine x 2)     Status: None   Collection Time: 05/15/20 10:31 AM   Specimen: BLOOD LEFT HAND  Result Value Ref Range Status   Specimen Description BLOOD LEFT HAND  Final   Special Requests   Final    BOTTLES DRAWN AEROBIC AND ANAEROBIC Blood Culture adequate volume   Culture   Final    NO GROWTH 5 DAYS Performed at [facility], [address] [place], [place] [address]    Report Status 05/20/2020 FINAL  Final  Aerobic/Anaerobic Culture (surgical/deep wound)     Status: None (Preliminary result)   Collection Time: 05/16/20 10:09 AM   Specimen: PATH Cytology Peritoneal fluid; Body Fluid  Result Value Ref Range Status   Specimen Description   Final    PERITONEAL Performed at [facility], [address], [place], [place] [address]    Special Requests   Final    NONE Performed at [facility], [address], [place], [place] [address]    Gram Stain   Final    FEW WBC PRESENT,BOTH PMN AND MONONUCLEAR RARE GRAM VARIABLE ROD    Culture   Final    ABUNDANT BACTEROIDES FRAGILIS BETA LACTAMASE POSITIVE RARE ENTEROCOCCUS FAECALIS SUSCEPTIBILITIES TO  FOLLOW Performed at [facility], [address], [place], [place] [address]    Report Status PENDING  Incomplete    RADIOLOGY STUDIES/RESULTS: No results found.  LOS: 7 days   Shon Hale, MD  [facility]  To contact the attending provider between 7A-7P or the covering provider during after hours 7P-7A, please log into the web site [email or web address] and access using universal [place] password for that web site. If you do not have the password, please call the hospital operator.  05/22/2020, 7:30 PM

## 2020-05-23 DIAGNOSIS — E2740 Unspecified adrenocortical insufficiency: Secondary | ICD-10-CM

## 2020-05-23 LAB — GLUCOSE, CAPILLARY
Glucose-Capillary: 105 mg/dL — ABNORMAL HIGH (ref 70–99)
Glucose-Capillary: 107 mg/dL — ABNORMAL HIGH (ref 70–99)
Glucose-Capillary: 118 mg/dL — ABNORMAL HIGH (ref 70–99)
Glucose-Capillary: 164 mg/dL — ABNORMAL HIGH (ref 70–99)
Glucose-Capillary: 173 mg/dL — ABNORMAL HIGH (ref 70–99)
Glucose-Capillary: 196 mg/dL — ABNORMAL HIGH (ref 70–99)

## 2020-05-23 LAB — AEROBIC/ANAEROBIC CULTURE W GRAM STAIN (SURGICAL/DEEP WOUND)

## 2020-05-23 MED ORDER — APIXABAN 5 MG PO TABS
5.0000 mg | ORAL_TABLET | Freq: Two times a day (BID) | ORAL | Status: DC
  Administered 2020-05-23 – 2020-05-25 (×4): 5 mg via ORAL
  Filled 2020-05-23 (×4): qty 1

## 2020-05-23 MED ORDER — MAGNESIUM SULFATE 2 GM/50ML IV SOLN
2.0000 g | Freq: Once | INTRAVENOUS | Status: AC
  Administered 2020-05-23: 18:00:00 2 g via INTRAVENOUS
  Filled 2020-05-23: qty 50

## 2020-05-23 MED ORDER — DILTIAZEM HCL 30 MG PO TABS
30.0000 mg | ORAL_TABLET | Freq: Four times a day (QID) | ORAL | Status: AC
  Administered 2020-05-23 – 2020-05-24 (×6): 30 mg via ORAL
  Filled 2020-05-23 (×6): qty 1

## 2020-05-23 NOTE — Plan of Care (Signed)

## 2020-05-23 NOTE — Progress Notes (Signed)
Inpatient Rehabilitation-Admissions Coordinator   Following up for my coworker, Estill Dooms. I do not have a bed open in CIR today for his patient. Will follow up tomorrow for possible admit, pending bed availability.   Cheri Rous, OTR/L  Rehab Admissions Coordinator  [phone] 05/23/2020 1:25 PM

## 2020-05-23 NOTE — Consult Note (Addendum)
[facility] Nurse wound follow up Patient family (son) has observed pouch change and spouse will not be assisting with care.  Discharge disposition will be rehab.  Due to simplicity of VAC change (1 piece black foam), will release this dressing change to bedside RN Mon/Wed/Fri and PRN ostomy pouch changes.  Will require 1 Medium foam kit ([facility] # [id number]).  Surgery team aware and agrees.  Will not follow at this time.  Please re-consult if needed.  Domenic Moras MSN, RN, FNP-BC [facility] Wound, Ostomy, Continence Nurse Pager [phone]

## 2020-05-23 NOTE — Progress Notes (Signed)
°   05/23/20 1650  Assess: MEWS Score  Temp 97.7 F (36.5 C)  BP 100/74  Pulse Rate (!) 133  Resp 18  Level of Consciousness Alert  SpO2 100 %  O2 Device Nasal Cannula  Patient Activity (if Appropriate) In chair  O2 Flow Rate (L/min) 2 L/min  Assess: MEWS Score  MEWS Temp 0  MEWS Systolic 1  MEWS Pulse 3  MEWS RR 0  MEWS LOC 0  MEWS Score 4  MEWS Score Color Red  Assess: if the MEWS score is Yellow or Red  Were vital signs taken at a resting state? Yes  Focused Assessment Change from prior assessment (see assessment flowsheet)  Early Detection of Sepsis Score *See Row Information* Low  MEWS guidelines implemented *See Row Information* Yes  Treat  MEWS Interventions Escalated (See documentation below)  Pain Scale 0-10  Pain Score 0  Take Vital Signs  Increase Vital Sign Frequency  Red: Q 1hr X 4 then Q 4hr X 4, if remains red, continue Q 4hrs  Escalate  MEWS: Escalate Red: discuss with charge nurse/RN and provider, consider discussing with RRT  Notify: Charge Nurse/RN  Name of Charge Nurse/RN Notified Risk manager, RN (notified patricia bengtson, RN since I am charge today)  Date Charge Nurse/RN Notified 05/23/20  Time Charge Nurse/RN Notified 1652  Notify: Provider  Provider Name/Title dr Shon Hale, MD  Date Provider Notified 05/23/20  Time Provider Notified [phone]  Notification Type Page  Notification Reason Change in status (HR sustaining 130s-150s)  Notify: Rapid Response  Name of Rapid Response RN Notified Daralene Milch, RN [facility]  Date Rapid Response Notified 05/23/20  Time Rapid Response Notified 1655

## 2020-05-23 NOTE — Progress Notes (Signed)
[facility]  Doing well. Tolerating diet. RN to change ostomy and wound vac. Can remove foley. Can restart Eliquis.  Going for inpatient rehab soon.    Algis Greenhouse, MD [facility] [address] [place], [place] [address] [phone] (office)

## 2020-05-23 NOTE — Progress Notes (Signed)
HR sustaining in the 130s-150s, afib, patient asymptomatic, sitting in chair on assessment. Denies chest pain, palpitations, dizziness, shortness of breath. Red MEWs score. Notified Dr. Marisa Severin. EKG obtained as ordered. afib with RVR, notified Dr. Marisa Severin. See adjusted orders for meds. Nursing supervisor notified of red MEWS score as well.

## 2020-05-23 NOTE — Progress Notes (Signed)
Physical Therapy Treatment Patient Details Name: Ann [name] MRN: [id number] DOB: 01-13-1946 Today's Date: 05/23/2020    History of Present Illness Ann [name]  is a 74 y.o. female, s/p colostomy on 05/16/20 due to acute GI bleeding, who is a reformed smoker with past medical history relevant for CAD with prior stents, prior history of diverticulosis/diverticulitis, GERD and erosive gastritis, chronic atrial fibrillation on anticoagulation with Eliquis,  history of DVT , history of adrenal insufficiency,hypothyroidism, COPD, type 2 diabetes on insulin, and morbid obesity as well as hypertension presents to the ED with abdominal pain of about 24 hours duration it is generalized patient rated at about 9/10 associated with chills and nausea but no emesis    PT Comments    Upon entry, pt up in chair with nurse tech exiting room, pt reports she just walked to the restroom and got cleaned up with nursing and is in pain, but wants to try therapy. Pt tolerates increased reps of exercise without increased abdominal pain. Pt performs STS reps from chair with increased abdominal pain, educated on avoiding val salva maneuver with good carryover and pt able to exhale with transfers. Pt declines ambulation after exercise due to abdominal pain from mobilizing with nursing and after STS reps with therapy. Educated pt on performing additional sets and reps of exercises throughout the day and pt verbalizes understanding and reports she has been performing them multiple times per day. Pt is very motivated but appears limited by pain this session, possibly due to mobilizing with nursing just prior to therapy session or due to pain medication schedule. Patient will benefit from continued physical therapy in hospital and recommendations below to increase strength, balance, endurance for safe ADLs and gait.    Follow Up Recommendations  CIR;Supervision for mobility/OOB;Supervision/Assistance - 24 hour      Equipment Recommendations  None recommended by PT    Recommendations for Other Services       Precautions / Restrictions Precautions Precautions: Fall Precaution Comments: dependent on supplemental O2 Restrictions Weight Bearing Restrictions: No    Mobility  Bed Mobility  General bed mobility comments: in chair upon arrival  Transfers Overall transfer level: Needs assistance Equipment used: Rolling walker (2 wheeled) Transfers: Sit to/from Stand Sit to Stand: Min guard    General transfer comment: slow, labored movement with cues to push from chair to power up  Ambulation/Gait  General Gait Details: pt declines due to abdominal pain with exercise and just walked with nursing to restroom prior to therapy   Stairs             Wheelchair Mobility    Modified Rankin (Stroke Patients Only)       Balance Overall balance assessment: Needs assistance  Standing balance support: During functional activity;Bilateral upper extremity supported Standing balance-Leahy Scale: Poor Standing balance comment: reliant on RW       Cognition Arousal/Alertness: Awake/alert Behavior During Therapy: WFL for tasks assessed/performed Overall Cognitive Status: Within Functional Limits for tasks assessed       Exercises General Exercises - Lower Extremity Long Arc Quad: Seated;AROM;Strengthening;Both;20 reps Hip Flexion/Marching: Seated;AROM;Strengthening;Both;20 reps Toe Raises: Seated;AROM;Strengthening;Both;20 reps Heel Raises: Seated;AROM;Strengthening;Both;20 reps    General Comments        Pertinent Vitals/Pain Pain Assessment: 0-10 Pain Score: 7  Pain Location: stomach Pain Descriptors / Indicators: Grimacing;Guarding ("gnawing pain") Pain Intervention(s): Limited activity within patient's tolerance;Monitored during session;Premedicated before session;Repositioned    Home Living  Prior Function            PT Goals (current  goals can now be found in the care plan section) Acute Rehab PT Goals Patient Stated Goal: return home with assistance PT Goal Formulation: With patient Time For Goal Achievement: 06/07/20 Potential to Achieve Goals: Good Progress towards PT goals: Progressing toward goals    Frequency    Min 3X/week      PT Plan Current plan remains appropriate    Co-evaluation              AM-PAC PT "6 Clicks" Mobility   Outcome Measure  Help needed turning from your back to your side while in a flat bed without using bedrails?: A Little Help needed moving from lying on your back to sitting on the side of a flat bed without using bedrails?: A Lot Help needed moving to and from a bed to a chair (including a wheelchair)?: A Lot Help needed standing up from a chair using your arms (e.g., wheelchair or bedside chair)?: A Little Help needed to walk in hospital room?: A Lot Help needed climbing 3-5 steps with a railing? : A Lot 6 Click Score: 14    End of Session Equipment Utilized During Treatment: Oxygen Activity Tolerance: Patient tolerated treatment well;Patient limited by pain Patient left: in chair;with call bell/phone within reach Nurse Communication: Mobility status PT Visit Diagnosis: Unsteadiness on feet (R26.81);Other abnormalities of gait and mobility (R26.89);Muscle weakness (generalized) (M62.81)     Time: 3976-7341 PT Time Calculation (min) (ACUTE ONLY): 20 min  Charges:  $Therapeutic Exercise: 8-22 mins                      Tori Melton Walls PT, DPT 05/23/20, 1:26 PM [phone]

## 2020-05-23 NOTE — Progress Notes (Signed)
PROGRESS NOTE        PATIENT DETAILS Name: Ann [name] Age: 74 y.o. Sex: female Date of Birth: November 05, 1945 Admit Date: 05/14/2020 Admitting Physician Evalee Mutton Kristeen Mans, MD [id number], Tesfaye, MD  Brief Narrative: Patient is a 74 y.o. female chronic diastolic heart failure, chronic atrial fibrillation, adrenal insufficiency, DM-2, COPD, CAD-s/p PCI-presented to the ED with 1 day history of left lower quadrant abdominal pain and hematochezia.  Further evaluation revealed sigmoid diverticulitis with perforation.  Hospital course complicated by acute blood loss anemia and hypotension-requiring transfer to the ICU on 12/20 for close monitoring.   S/p  perforated diverticulitis and end colostomy in place on 05/16/20.  -Medically stable for discharge -Awaiting bed availability at CIR  Significant events: 12/19>> admit for sigmoid diverticulitis with perforation-hematochezia with acute blood loss anemia 12/20>> multiple episodes of hematochezia overnight-hypotension-transfer to ICU 12/21>>To OR-colectomy/colostomy  Significant studies: 12/19>> CT abdomen/pelvis: Sigmoid diverticulitis with contained perforation.  Antimicrobial therapy: Zosyn: 12/19>>  Microbiology data: 12/21>> intraoperative/intraperitoneal cultures: NGTD  Procedures : 12/21>> partial colectomy with end colostomy-Dr Constance Haw  Consults: GI, general surgery  DVT Prophylaxis : Place TED hose Start: 05/21/20 1606 SCDs Start: 05/14/20 1348 Place TED hose Start: 05/14/20 1348   Subjective:  -Husband at bedside, eating and drinking well, episode of A. fib with RVR -having Good colostomy output    Assessment/Plan: Hypotension: Resolved multifactorial etiology-secondary to acute blood loss, sepsis-and relative adrenal insufficiency.  Improved after PRBC transfusion, IV fluid-patient and initialing of stress dose steroids. -Stopped IV hydrocortisone on 05/22/2020 C/n  p.o. prednisone    Sepsis due to sigmoid diverticulitis with contained perforation: Sepsis physiology has improved-underwent colectomy with colostomy placement on 12/21-discussed with Dr. Constance Haw- --continue antimicrobial therapy with Zosyn through 05/19/2020 due to peritoneal contamination.    intraoperative cultures-NGTD -05/22/20--Vac and ostomy changed by the ostomy RN  Lower GI bleeding/Hematochezia with acute blood loss anemia: Unusual for diverticular bleeding in the setting of diverticulitis-   S/p 2 units of PRBC transfusion-hemoglobin stable.  GI/general surgery following-she is s/p colectomy-probably does not require further work-up.  --Hemoglobin up to 9.6 after a total of 3 units of PRBC this admission   Follow surgical biopsies -Will need outpatient GI follow-up for endoluminal evaluation  Chronic atrial fibrillation with RVR: Rate is not adequately controlled at this time change Cardizem to 30 mg 4 times daily, -Consider switching to long-acting Cardizem in a.m. if rate better controlled -Magnesium was previously low we gave IV magnesium -Per general surgeon okay to restart - Eliquis   Chronic diastolic heart failure: Volume status stable-Lasix/other therapies on hold. Tolerates oral intake well, IV fluids changed to [facility] -Consider restarting Lasix prior to discharge  CAD-history of prior PCI: No anginal symptoms-not on aspirin as patient on anticoagulation.  COPD: Stable-continue bronchodilators.  AKI: Mild-likely hemodynamically mediated-with some element of contrast-induced nephropathy (received contrast for CT abdomen on admission). -AKI has resolved , creatinine normalized - renally adjust medications, avoid nephrotoxic agents / dehydration  / hypotension  Relative adrenal insufficiency: was on 10 mg of prednisone daily at home- -see above regarding hypotension-now on stress dose hydrocortisone- will gradually taper down to usual prednisone dosing over the next few days.   -Completed  IV hydrocortisone on 05/22/2020 Continue  p.o. prednisone  History of vitamin B12 deficiency: Continue supplementation  Hypothyroidism (TSH 1.22 on 8/12): On Synthroid  DM-2 (A1c 7.1 on  11/16): CBGs relatively stable- Continue Levemir 18 units nightly from 15 units--due to persistent hyperglycemia -- SSI to every 4 hours.  Follow and adjust  Recent Labs    05/23/20 0759 05/23/20 1130 05/23/20 1635  GLUCAP 105* 164* 196*    Morbid Obesity: Estimated body mass index is 49.68 kg/m as calculated from the following:   Height as of this encounter: 5\' 3"  (1.6 m).   Weight as of this encounter: 127.2 kg.   --Generalized weakness and deconditioning--PT eval appreciated, recommends SNF rehab, patient is reluctant to consider SNF rehab Patient and husband are agreeable to transfer to CIR for ongoing rehab  Diet: Diet Order            DIET SOFT Room service appropriate? Yes; Fluid consistency: Thin  Diet effective now                 Code Status: DNR  Family Communication: Spouse Baldo Ash [phone])-and son  Disposition Plan: Status [id number]  The patient will require care spanning > 2 midnights and should be moved to inpatient because: -Bowel function is returning, oral intake fair-- awaiting transfer to CIR,    Dispo: The patient is from: Home              Anticipated d/c is to: Awaiting transfer to CIR              Anticipated d/c date is: 1 day              Patient currently is medically stable to d/c.  Barriers to Discharge:     Awaiting availability of bed for transfer to CIR  Antimicrobial agents: Anti-infectives (From admission, onward)   Start     Dose/Rate Route Frequency Ordered Stop   05/16/20 0600  cefoTEtan (CEFOTAN) 2 g in sodium chloride 0.9 % 100 mL IVPB        2 g 200 mL/hr over 30 Minutes Intravenous On call to O.R. 05/15/20 1039 05/16/20 0825   05/15/20 1000  piperacillin-tazobactam (ZOSYN) IVPB 3.375 g        3.375 g 12.5 mL/hr over 240  Minutes Intravenous Every 8 hours 05/15/20 0000 05/20/20 0938   05/15/20 0100  piperacillin-tazobactam (ZOSYN) IVPB 3.375 g        3.375 g 12.5 mL/hr over 240 Minutes Intravenous  Once 05/14/20 2357 05/15/20 1322     MEDICATIONS: Scheduled Meds:  atorvastatin  80 mg Oral q1800   Chlorhexidine Gluconate Cloth  6 each Topical Daily   cholecalciferol  5,000 Units Oral Daily   darifenacin  7.5 mg Oral Daily   diltiazem  30 mg Oral QID   gabapentin  400 mg Oral TID   insulin aspart  0-9 Units Subcutaneous Q4H   insulin detemir  18 Units Subcutaneous Q2200   levothyroxine  150 mcg Oral QAC breakfast   mirabegron ER  50 mg Oral Daily   pantoprazole  40 mg Oral Daily   predniSONE  10 mg Oral Q breakfast   sertraline  50 mg Oral QHS   sodium chloride flush  10-40 mL Intracatheter Q12H   sodium chloride flush  3 mL Intravenous Q12H   sodium chloride flush  3 mL Intravenous Q12H   umeclidinium bromide  1 puff Inhalation Daily   cyanocobalamin  1,000 mcg Oral Daily   Continuous Infusions:  sodium chloride Stopped (05/23/20 1736)   feeding supplement (GLUCERNA 1.2 CAL)     lactated ringers 20 mL/hr at 05/20/20 1227  magnesium sulfate bolus IVPB 50 mL/hr at 05/23/20 1743   sodium chloride Stopped (05/15/20 0945)   PRN Meds:.sodium chloride, acetaminophen **OR** acetaminophen, albuterol, metoprolol tartrate, nitroGLYCERIN, ondansetron **OR** ondansetron (ZOFRAN) IV, oxyCODONE, polyvinyl alcohol, sodium chloride, sodium chloride flush, sodium chloride flush, traZODone   PHYSICAL EXAM: Vital signs: Vitals:   05/23/20 0802 05/23/20 1429 05/23/20 1650 05/23/20 1750  BP:  106/67 100/74 127/87  Pulse:  69 (!) 133 88  Resp:  17 18 20   Temp:  (!) 97.4 F (36.3 C) 97.7 F (36.5 C) 98.2 F (36.8 C)  TempSrc:  Oral Oral Oral  SpO2: 96% 95% 100% 95%  Weight:      Height:       Filed Weights   05/17/20 0448 05/18/20 0500 05/19/20 0500  Weight: 127.2 kg 127.2 kg  127.2 kg   Body mass index is 49.68 kg/m.   Phy Exam- Gen Exam:Alert awake-morbidly obese, not in any distress HEENT:atraumatic, normocephalic Chest: B/L clear to auscultation anteriorly CVS:S1S2 irregular Abdomen:Soft- +bs,-Ostomy bag with semi-liquid stool,wound VAC in situ Extremities:no edema, good pedal pulses Neurology: Generalized weakness, Non focal Skin: no rash, warm and dry GU--Foley catheter to be removed  LABORATORY DATA: CBC: Recent Labs  Lab 05/17/20 0442 05/18/20 0438 05/19/20 0421 05/20/20 0729 05/22/20 0548  WBC 13.6* 9.1 10.0 11.9* 9.5  HGB 8.8* 7.1* 9.5* 9.8* 9.6*  HCT 31.5* 25.0* 32.5* 34.3* 33.2*  MCV 86.1 87.1 86.2 86.2 86.7  PLT 184 165 179 189 227    Basic Metabolic Panel: Recent Labs  Lab 05/17/20 0442 05/18/20 0438 05/19/20 0421 05/20/20 0729 05/22/20 0548  NA 140 138 140 140 139  K 4.2 3.7 3.1* 4.4 3.5  CL 105 103 100 101 97*  CO2 26 24 31  33* 33*  GLUCOSE 141* 106* 114* 154* 121*  BUN 29* 18 13 12 12   CREATININE 1.05* 0.68 0.65 0.54 0.56  CALCIUM 7.9* 8.1* 8.6* 8.9 8.6*  MG  --  1.8  --  1.7  --    GFR: Estimated Creatinine Clearance: 80.2 mL/min (by C-G formula based on SCr of 0.56 mg/dL).  Liver Function Tests: Recent Labs  Lab 05/17/20 0442 05/18/20 0438  AST 34 35  ALT 16 17  ALKPHOS 46 39  BILITOT 0.5 0.5  PROT 5.8* 5.2*  ALBUMIN 2.6* 2.5*   No results for input(s): LIPASE, AMYLASE in the last 168 hours. No results for input(s): AMMONIA in the last 168 hours.  Coagulation Profile: No results for input(s): INR, PROTIME in the last 168 hours.  Cardiac Enzymes: No results for input(s): CKTOTAL, CKMB, CKMBINDEX, TROPONINI in the last 168 hours.  BNP (last 3 results) No results for input(s): PROBNP in the last 8760 hours.  Lipid Profile: No results for input(s): CHOL, HDL, LDLCALC, TRIG, CHOLHDL, LDLDIRECT in the last 72 hours.  Thyroid Function Tests: No results for input(s): TSH, T4TOTAL, FREET4, T3FREE,  THYROIDAB in the last 72 hours.  Anemia Panel: No results for input(s): VITAMINB12, FOLATE, FERRITIN, TIBC, IRON, RETICCTPCT in the last 72 hours.  Urine analysis:    Component Value Date/Time   COLORURINE YELLOW 01/23/2020 1819   APPEARANCEUR CLEAR 01/23/2020 1819   LABSPEC 1.012 01/23/2020 1819   PHURINE 5.0 01/23/2020 1819   GLUCOSEU NEGATIVE 01/23/2020 1819   HGBUR NEGATIVE 01/23/2020 1819   BILIRUBINUR NEGATIVE 01/23/2020 1819   KETONESUR NEGATIVE 01/23/2020 1819   PROTEINUR NEGATIVE 01/23/2020 1819   UROBILINOGEN 0.2 03/15/2013 1504   NITRITE NEGATIVE 01/23/2020 1819   LEUKOCYTESUR NEGATIVE 01/23/2020  1819   Sepsis Labs: Lactic Acid, Venous    Component Value Date/Time   LATICACIDVEN 1.2 05/15/2020 0345   MICROBIOLOGY: Recent Results (from the past 240 hour(s))  Resp Panel by RT-PCR (Flu A&B, Covid) Nasopharyngeal Swab     Status: None   Collection Time: 05/14/20 12:05 PM   Specimen: Nasopharyngeal Swab; Nasopharyngeal(NP) swabs in vial transport medium  Result Value Ref Range Status   SARS Coronavirus 2 by RT PCR NEGATIVE NEGATIVE Final    Comment: (NOTE) SARS-CoV-2 target nucleic acids are NOT DETECTED.  The SARS-CoV-2 RNA is generally detectable in upper respiratory specimens during the acute phase of infection. The lowest concentration of SARS-CoV-2 viral copies this assay can detect is 138 copies/mL. A negative result does not preclude SARS-Cov-2 infection and should not be used as the sole basis for treatment or other patient management decisions. A negative result may occur with  improper specimen collection/handling, submission of specimen other than nasopharyngeal swab, presence of viral mutation(s) within the areas targeted by this assay, and inadequate number of viral copies(<138 copies/mL). A negative result must be combined with clinical observations, patient history, and epidemiological information. The expected result is Negative.  Fact Sheet for  Patients:  EntrepreneurPulse.com.au  Fact Sheet for Healthcare Providers:  IncredibleEmployment.be  This test is no t yet approved or cleared by the [place] FDA and  has been authorized for detection and/or diagnosis of SARS-CoV-2 by FDA under an Emergency Use Authorization (EUA). This EUA will remain  in effect (meaning this test can be used) for the duration of the COVID-19 declaration under Section 564(b)(1) of the Act, 21 U.S.C.section 360bbb-3(b)(1), unless the authorization is terminated  or revoked sooner.       Influenza A by PCR NEGATIVE NEGATIVE Final   Influenza B by PCR NEGATIVE NEGATIVE Final    Comment: (NOTE) The Xpert Xpress SARS-CoV-2/FLU/RSV plus assay is intended as an aid in the diagnosis of influenza from Nasopharyngeal swab specimens and should not be used as a sole basis for treatment. Nasal washings and aspirates are unacceptable for Xpert Xpress SARS-CoV-2/FLU/RSV testing.  Fact Sheet for Patients: EntrepreneurPulse.com.au  Fact Sheet for Healthcare Providers: IncredibleEmployment.be  This test is not yet approved or cleared by the [place] FDA and has been authorized for detection and/or diagnosis of SARS-CoV-2 by FDA under an Emergency Use Authorization (EUA). This EUA will remain in effect (meaning this test can be used) for the duration of the COVID-19 declaration under Section 564(b)(1) of the Act, 21 U.S.C. section 360bbb-3(b)(1), unless the authorization is terminated or revoked.  Performed at [facility], [address], [place], [place] [address]   MRSA PCR Screening     Status: None   Collection Time: 05/15/20  8:51 AM   Specimen: Nasal Mucosa; Nasopharyngeal  Result Value Ref Range Status   MRSA by PCR NEGATIVE NEGATIVE Final    Comment:        The GeneXpert MRSA Assay (FDA approved for NASAL specimens only), is one component of a comprehensive MRSA  colonization surveillance program. It is not intended to diagnose MRSA infection nor to guide or monitor treatment for MRSA infections. Performed at [facility], [address], [place], [place] [address]   Culture, blood (routine x 2)     Status: None   Collection Time: 05/15/20 10:26 AM   Specimen: BLOOD RIGHT HAND  Result Value Ref Range Status   Specimen Description BLOOD RIGHT HAND  Final   Special Requests   Final  BOTTLES DRAWN AEROBIC AND ANAEROBIC Blood Culture results may not be optimal due to an inadequate volume of blood received in culture bottles   Culture   Final    NO GROWTH 5 DAYS Performed at [facility], [address], [place], [place] [address]    Report Status 05/20/2020 FINAL  Final  Culture, blood (routine x 2)     Status: None   Collection Time: 05/15/20 10:31 AM   Specimen: BLOOD LEFT HAND  Result Value Ref Range Status   Specimen Description BLOOD LEFT HAND  Final   Special Requests   Final    BOTTLES DRAWN AEROBIC AND ANAEROBIC Blood Culture adequate volume   Culture   Final    NO GROWTH 5 DAYS Performed at [facility], [address], [place], [place] [address]    Report Status 05/20/2020 FINAL  Final  Aerobic/Anaerobic Culture (surgical/deep wound)     Status: None   Collection Time: 05/16/20 10:09 AM   Specimen: PATH Cytology Peritoneal fluid; Body Fluid  Result Value Ref Range Status   Specimen Description   Final    PERITONEAL Performed at [facility], [address], [place], [place] [address]    Special Requests   Final    NONE Performed at [place] [place], [place] [address]    Gram Stain   Final    FEW WBC PRESENT,BOTH PMN AND MONONUCLEAR RARE GRAM VARIABLE ROD Performed at [facility], [place] [address] [place],  [address]    Culture   Final    ABUNDANT BACTEROIDES FRAGILIS BETA LACTAMASE POSITIVE RARE ENTEROCOCCUS FAECALIS    Report Status 05/23/2020 FINAL  Final   Organism ID, Bacteria  ENTEROCOCCUS FAECALIS  Final      Susceptibility   Enterococcus faecalis - MIC*    AMPICILLIN <=2 SENSITIVE Sensitive     VANCOMYCIN 2 SENSITIVE Sensitive     GENTAMICIN SYNERGY SENSITIVE Sensitive     * RARE ENTEROCOCCUS FAECALIS    RADIOLOGY STUDIES/RESULTS: No results found.  LOS: 8 days   Roxan Hockey, MD  [facility]  To contact the attending provider between 7A-7P or the covering provider during after hours 7P-7A, please log into the web site [email or web address] and access using universal [place] password for that web site. If you do not have the password, please call the hospital operator.  05/23/2020, 6:35 PM

## 2020-05-24 DIAGNOSIS — A419 Sepsis, unspecified organism: Secondary | ICD-10-CM | POA: Diagnosis not present

## 2020-05-24 DIAGNOSIS — R6520 Severe sepsis without septic shock: Secondary | ICD-10-CM | POA: Diagnosis not present

## 2020-05-24 DIAGNOSIS — N179 Acute kidney failure, unspecified: Secondary | ICD-10-CM | POA: Diagnosis not present

## 2020-05-24 LAB — BASIC METABOLIC PANEL
Anion gap: 8 (ref 5–15)
BUN: 9 mg/dL (ref 8–23)
CO2: 32 mmol/L (ref 22–32)
Calcium: 8.5 mg/dL — ABNORMAL LOW (ref 8.9–10.3)
Chloride: 101 mmol/L (ref 98–111)
Creatinine, Ser: 0.56 mg/dL (ref 0.44–1.00)
GFR, Estimated: >60 mL/min (ref >60)
Glucose, Bld: 90 mg/dL (ref 70–99)
Potassium: 3.6 mmol/L (ref 3.5–5.1)
Sodium: 141 mmol/L (ref 135–145)

## 2020-05-24 LAB — GLUCOSE, CAPILLARY
Glucose-Capillary: 105 mg/dL — ABNORMAL HIGH (ref 70–99)
Glucose-Capillary: 120 mg/dL — ABNORMAL HIGH (ref 70–99)
Glucose-Capillary: 149 mg/dL — ABNORMAL HIGH (ref 70–99)
Glucose-Capillary: 156 mg/dL — ABNORMAL HIGH (ref 70–99)
Glucose-Capillary: 167 mg/dL — ABNORMAL HIGH (ref 70–99)
Glucose-Capillary: 176 mg/dL — ABNORMAL HIGH (ref 70–99)
Glucose-Capillary: 87 mg/dL (ref 70–99)

## 2020-05-24 LAB — CBC
HCT: 33.4 % — ABNORMAL LOW (ref 36.0–46.0)
Hemoglobin: 9.7 g/dL — ABNORMAL LOW (ref 12.0–15.0)
MCH: 25.4 pg — ABNORMAL LOW (ref 26.0–34.0)
MCHC: 29 g/dL — ABNORMAL LOW (ref 30.0–36.0)
MCV: 87.4 fL (ref 80.0–100.0)
Platelets: 250 10*3/uL (ref 150–400)
RBC: 3.82 MIL/uL — ABNORMAL LOW (ref 3.87–5.11)
RDW: 17.3 % — ABNORMAL HIGH (ref 11.5–15.5)
WBC: 8.8 10*3/uL (ref 4.0–10.5)
nRBC: 0 % (ref 0.0–0.2)

## 2020-05-24 LAB — MAGNESIUM: Magnesium: 2.1 mg/dL (ref 1.7–2.4)

## 2020-05-24 MED ORDER — DILTIAZEM HCL ER COATED BEADS 180 MG PO CP24
180.0000 mg | ORAL_CAPSULE | Freq: Every day | ORAL | Status: DC
  Administered 2020-05-25: 09:00:00 180 mg via ORAL
  Filled 2020-05-24: qty 1

## 2020-05-24 MED ORDER — FUROSEMIDE 40 MG PO TABS
40.0000 mg | ORAL_TABLET | Freq: Two times a day (BID) | ORAL | Status: DC
  Administered 2020-05-25: 09:00:00 40 mg via ORAL
  Filled 2020-05-24: qty 1

## 2020-05-24 NOTE — Progress Notes (Signed)
PROGRESS NOTE        PATIENT DETAILS Name: Ann [name] Age: 74 y.o. Sex: female Date of Birth: September 22, 1945 Admit Date: 05/14/2020 Admitting Physician Dewayne Shorter Levora Dredge, MD BVQ:[id number], Tesfaye, MD  Brief Narrative: Patient is a 74 y.o. female chronic diastolic heart failure, chronic atrial fibrillation, adrenal insufficiency, DM-2, COPD, CAD-s/p PCI-presented to the ED with 1 day history of left lower quadrant abdominal pain and hematochezia.  Further evaluation revealed sigmoid diverticulitis with perforation.  Hospital course complicated by acute blood loss anemia and hypotension-requiring transfer to the ICU on 12/20 for close monitoring.   S/p  perforated diverticulitis and end colostomy in place on 05/16/20.  -Medically stable for discharge -Awaiting bed availability at CIR  Significant events: 12/19>> admit for sigmoid diverticulitis with perforation-hematochezia with acute blood loss anemia 12/20>> multiple episodes of hematochezia overnight-hypotension-transfer to ICU 12/21>>To OR-colectomy/colostomy  Significant studies: 12/19>> CT abdomen/pelvis: Sigmoid diverticulitis with contained perforation.  Antimicrobial therapy: Zosyn: 12/19>>12/24  Microbiology data: 12/21>> intraoperative/intraperitoneal cultures: enterococcus  Procedures : 12/21>> partial colectomy with end colostomy-Dr Henreitta Leber  Consults: GI, general surgery  DVT Prophylaxis : Place TED hose Start: 05/21/20 1606 SCDs Start: 05/14/20 1348 Place TED hose Start: 05/14/20 1348   Subjective: Patient sitting up in the chair, she has some occasional shooting pains in her abdomen, worse after eating, but no vomiting, increase in stool output, fever, confusion.    Marland Kitchen apixaban  5 mg Oral BID  . atorvastatin  80 mg Oral q1800  . Chlorhexidine Gluconate Cloth  6 each Topical Daily  . cholecalciferol  5,000 Units Oral Daily  . darifenacin  7.5 mg Oral Daily  . diltiazem  30 mg  Oral QID  . gabapentin  400 mg Oral TID  . insulin aspart  0-9 Units Subcutaneous Q4H  . insulin detemir  18 Units Subcutaneous Q2200  . levothyroxine  150 mcg Oral QAC breakfast  . mirabegron ER  50 mg Oral Daily  . pantoprazole  40 mg Oral Daily  . predniSONE  10 mg Oral Q breakfast  . sertraline  50 mg Oral QHS  . sodium chloride flush  10-40 mL Intracatheter Q12H  . sodium chloride flush  3 mL Intravenous Q12H  . sodium chloride flush  3 mL Intravenous Q12H  . umeclidinium bromide  1 puff Inhalation Daily  . cyanocobalamin  1,000 mcg Oral Daily      Assessment/Plan: Hypotension: Resolved multifactorial etiology-secondary to acute blood loss, sepsis-and relative adrenal insufficiency.  Improved after PRBC transfusion, IV fluid-patient and initialing of stress dose steroids. -Stopped IV hydrocortisone on 05/22/2020 -Continue prednisone  Sepsis due to sigmoid diverticulitis with contained perforation: Sepsis physiology has improved-underwent colectomy with colostomy placement on 12/21-discussed with Dr. Henreitta Leber- --continue antimicrobial therapy with Zosyn through 05/19/2020 due to peritoneal contamination.    intraoperative cultures-enterococcus, completed 5 days appropriate therapy, no new fever, no new leuckocytosis, no woresneing pain -05/22/20--Vac and ostomy changed by the ostomy RN  Lower GI bleed/Hematochezia with acute blood loss anemia: Unusual for diverticular bleeding in the setting of diverticulitis-   S/p 2 units of PRBC transfusion-hemoglobin stable.  GI/general surgery following-she is s/p colectomy-probably does not require further work-up.  --Hemoglobin up to 9.6 after a total of 3 units of PRBC this admission   Follow surgical biopsies -Will need outpatient GI follow-up for endoluminal evaluation Hgb stable -Repeat CBC in 1  week  Chronic atrial fibrillation with RVR: Rate is not adequately controlled at this time change Cardizem to 30 mg 4 times daily Heart rate  controlled last 24 hours -Transition to oral Cardizem, long-acting -Magnesium was previously low we gave IV magnesium -Continue Eliquis -Continue Toprol  Chronic diastolic hear failure: Volume status stable-Lasix/other therapies on hold. Tolerates oral intake well, IV fluids changed to [facility] No evidence of fluid overload -Restart Lasix  CAD-history of prior PCI: No anginal symptoms-not on aspirin as patient on anticoagulation.  COPD: Stable-continue bronchodilators.  AKI: Mild-likely hemodynamically mediated-with some element of contrast-induced nephropathy (received contrast for CT abdomen on admission). -AKI has resolved , creatinine normalized - renally adjust medications, avoid nephrotoxic agents / dehydration  / hypotension  Relative adrenal insufficiency: was on 10 mg of prednisone daily at home- -see above regarding hypotension-now on stress dose hydrocortisone- will gradually taper down to usual prednisone dosing over the next few days.   -Completed IV hydrocortisone on 05/22/2020 Continue prednisone  History of vitamin B12 deficiency: Continue supplementation  Hypothyroidism (TSH 1.22 on 8/12):  Continue Synthroid  Diabetes (A1c 7.1 on 11/16): Well-controlled at baseline, with polyneuropathy Glucoses controlled today -Continue Levemir  -Continue sliding scale corrections -Continue statin -Continue gabapentin  Urinary incontinence -Continue darifenacin  GERD -Continue pantoprazole   Recent Labs    05/24/20 0755 05/24/20 1117 05/24/20 1626  GLUCAP 87 167* 176*    Morbid Obesity: Estimated body mass index is 49.68 kg/m as calculated from the following:   Height as of this encounter: 5\' 3"  (1.6 m).   Weight as of this encounter: 127.2 kg.   --Generalized weakness and deconditioning--PT eval appreciated, recommends SNF rehab, patient is reluctant to consider SNF rehab Patient and husband are agreeable to transfer to CIR for ongoing rehab  Diet: Diet  Order            DIET SOFT Room service appropriate? Yes; Fluid consistency: Thin  Diet effective now                 Code Status: DNR  Family Communication:   Disposition Plan: Status ZC:[id number]  The patient will require care spanning > 2 midnights and should be moved to inpatient because: -Bowel function is returning, oral intake fair-- awaiting transfer to CIR,    Dispo: The patient is from: Home              Anticipated d/c is to: Awaiting transfer to CIR              Anticipated d/c date is: 1 day              Patient currently is medically stable to d/c.  Barriers to Discharge:     Awaiting availability of bed for transfer to CIR  Antimicrobial agents: Anti-infectives (From admission, onward)   Start     Dose/Rate Route Frequency Ordered Stop   05/16/20 0600  cefoTEtan (CEFOTAN) 2 g in sodium chloride 0.9 % 100 mL IVPB        2 g 200 mL/hr over 30 Minutes Intravenous On call to O.R. 05/15/20 1039 05/16/20 0825   05/15/20 1000  piperacillin-tazobactam (ZOSYN) IVPB 3.375 g        3.375 g 12.5 mL/hr over 240 Minutes Intravenous Every 8 hours 05/15/20 0000 05/20/20 0938   05/15/20 0100  piperacillin-tazobactam (ZOSYN) IVPB 3.375 g        3.375 g 12.5 mL/hr over 240 Minutes Intravenous  Once 05/14/20 2357 05/15/20 1322  MEDICATIONS: Scheduled Meds: . apixaban  5 mg Oral BID  . atorvastatin  80 mg Oral q1800  . Chlorhexidine Gluconate Cloth  6 each Topical Daily  . cholecalciferol  5,000 Units Oral Daily  . darifenacin  7.5 mg Oral Daily  . diltiazem  30 mg Oral QID  . gabapentin  400 mg Oral TID  . insulin aspart  0-9 Units Subcutaneous Q4H  . insulin detemir  18 Units Subcutaneous Q2200  . levothyroxine  150 mcg Oral QAC breakfast  . mirabegron ER  50 mg Oral Daily  . pantoprazole  40 mg Oral Daily  . predniSONE  10 mg Oral Q breakfast  . sertraline  50 mg Oral QHS  . sodium chloride flush  10-40 mL Intracatheter Q12H  . sodium chloride flush  3 mL  Intravenous Q12H  . sodium chloride flush  3 mL Intravenous Q12H  . umeclidinium bromide  1 puff Inhalation Daily  . cyanocobalamin  1,000 mcg Oral Daily   Continuous Infusions: . sodium chloride Stopped (05/23/20 1736)  . feeding supplement (GLUCERNA 1.2 CAL)    . lactated ringers 20 mL/hr at 05/20/20 1227  . sodium chloride Stopped (05/15/20 0945)   PRN Meds:.sodium chloride, acetaminophen **OR** acetaminophen, albuterol, metoprolol tartrate, nitroGLYCERIN, ondansetron **OR** ondansetron (ZOFRAN) IV, oxyCODONE, polyvinyl alcohol, sodium chloride, sodium chloride flush, sodium chloride flush, traZODone   PHYSICAL EXAM: Vital signs: Vitals:   05/24/20 0804 05/24/20 0834 05/24/20 1143 05/24/20 1250  BP: (!) 133/96   116/74  Pulse: 96   91  Resp: 18   18  Temp: 97.6 F (36.4 C)   97.9 F (36.6 C)  TempSrc: Oral   Oral  SpO2: 98% 96% 95% 95%  Weight:      Height:       Filed Weights   05/17/20 0448 05/18/20 0500 05/19/20 0500  Weight: 127.2 kg 127.2 kg 127.2 kg   Body mass index is 49.68 kg/m.   Phy Exam- Gen Exam: Awake and alert, sitting in recliner, no acute distress HEENT: Anicteric, conjunctival pink, lids and lashes normal.  No nasal deformity, discharge, or epistaxis Chest: Respiratory effort normal, lung sounds clear bilaterally, no rales or wheezing CVS: Regularly irregular, rate normal, no systolic murmurs, no lower extremity edema noted Abdomen: No focal abdominal pain, bowel sounds positive, abdomen soft without guarding,-Ostomy bag with semi-liquid stool,wound VAC in situ Extremities:no edema, good pedal pulses Neurology: Strength symmetric 4/5 bilateral upper and lower extremities.  Extraocular movements intact, speech fluent, normal, affect normal    LABORATORY DATA: CBC: Recent Labs  Lab 05/18/20 0438 05/19/20 0421 05/20/20 0729 05/22/20 0548 05/24/20 0448  WBC 9.1 10.0 11.9* 9.5 8.8  HGB 7.1* 9.5* 9.8* 9.6* 9.7*  HCT 25.0* 32.5* 34.3* 33.2* 33.4*   MCV 87.1 86.2 86.2 86.7 87.4  PLT 165 179 189 227 250    Basic Metabolic Panel: Recent Labs  Lab 05/18/20 0438 05/19/20 0421 05/20/20 0729 05/22/20 0548 05/24/20 0448  NA 138 140 140 139 141  K 3.7 3.1* 4.4 3.5 3.6  CL 103 100 101 97* 101  CO2 24 31 33* 33* 32  GLUCOSE 106* 114* 154* 121* 90  BUN 18 13 12 12 9   CREATININE 0.68 0.65 0.54 0.56 0.56  CALCIUM 8.1* 8.6* 8.9 8.6* 8.5*  MG 1.8  --  1.7  --  2.1   GFR: Estimated Creatinine Clearance: 80.2 mL/min (by C-G formula based on SCr of 0.56 mg/dL).  Liver Function Tests: Recent Labs  Lab 05/18/20 [phone]  AST 35  ALT 17  ALKPHOS 39  BILITOT 0.5  PROT 5.2*  ALBUMIN 2.5*   No results for input(s): LIPASE, AMYLASE in the last 168 hours. No results for input(s): AMMONIA in the last 168 hours.  Coagulation Profile: No results for input(s): INR, PROTIME in the last 168 hours.  Cardiac Enzymes: No results for input(s): CKTOTAL, CKMB, CKMBINDEX, TROPONINI in the last 168 hours.  BNP (last 3 results) No results for input(s): PROBNP in the last 8760 hours.  Lipid Profile: No results for input(s): CHOL, HDL, LDLCALC, TRIG, CHOLHDL, LDLDIRECT in the last 72 hours.  Thyroid Function Tests: No results for input(s): TSH, T4TOTAL, FREET4, T3FREE, THYROIDAB in the last 72 hours.  Anemia Panel: No results for input(s): VITAMINB12, FOLATE, FERRITIN, TIBC, IRON, RETICCTPCT in the last 72 hours.  Urine analysis:    Component Value Date/Time   COLORURINE YELLOW 01/23/2020 1819   APPEARANCEUR CLEAR 01/23/2020 1819   LABSPEC 1.012 01/23/2020 1819   PHURINE 5.0 01/23/2020 1819   GLUCOSEU NEGATIVE 01/23/2020 1819   HGBUR NEGATIVE 01/23/2020 1819   BILIRUBINUR NEGATIVE 01/23/2020 1819   KETONESUR NEGATIVE 01/23/2020 1819   PROTEINUR NEGATIVE 01/23/2020 1819   UROBILINOGEN 0.2 03/15/2013 1504   NITRITE NEGATIVE 01/23/2020 1819   LEUKOCYTESUR NEGATIVE 01/23/2020 1819   Sepsis Labs: Lactic Acid, Venous    Component Value  Date/Time   LATICACIDVEN 1.2 05/15/2020 0345   MICROBIOLOGY: Recent Results (from the past 240 hour(s))  MRSA PCR Screening     Status: None   Collection Time: 05/15/20  8:51 AM   Specimen: Nasal Mucosa; Nasopharyngeal  Result Value Ref Range Status   MRSA by PCR NEGATIVE NEGATIVE Final    Comment:        The GeneXpert MRSA Assay (FDA approved for NASAL specimens only), is one component of a comprehensive MRSA colonization surveillance program. It is not intended to diagnose MRSA infection nor to guide or monitor treatment for MRSA infections. Performed at [facility], [address] [place], [place] [address]   Culture, blood (routine x 2)     Status: None   Collection Time: 05/15/20 10:26 AM   Specimen: BLOOD RIGHT HAND  Result Value Ref Range Status   Specimen Description BLOOD RIGHT HAND  Final   Special Requests   Final    BOTTLES DRAWN AEROBIC AND ANAEROBIC Blood Culture results may not be optimal due to an inadequate volume of blood received in culture bottles   Culture   Final    NO GROWTH 5 DAYS Performed at [facility], [address] [place], [place] [address]    Report Status 05/20/2020 FINAL  Final  Culture, blood (routine x 2)     Status: None   Collection Time: 05/15/20 10:31 AM   Specimen: BLOOD LEFT HAND  Result Value Ref Range Status   Specimen Description BLOOD LEFT HAND  Final   Special Requests   Final    BOTTLES DRAWN AEROBIC AND ANAEROBIC Blood Culture adequate volume   Culture   Final    NO GROWTH 5 DAYS Performed at [facility], [address] [place], [place] [address]    Report Status 05/20/2020 FINAL  Final  Aerobic/Anaerobic Culture (surgical/deep wound)     Status: None   Collection Time: 05/16/20 10:09 AM   Specimen: PATH Cytology Peritoneal fluid; Body Fluid  Result Value Ref Range Status   Specimen Description   Final    PERITONEAL Performed at [facility], [address], [place], [place]  [address]    Special Requests   Final     NONE Performed at [facility], [address] [place], [place] [address]    Gram Stain   Final    FEW WBC PRESENT,BOTH PMN AND MONONUCLEAR RARE GRAM VARIABLE ROD Performed at [facility], [address] [place], [place] [address]    Culture   Final    ABUNDANT BACTEROIDES FRAGILIS BETA LACTAMASE POSITIVE RARE ENTEROCOCCUS FAECALIS    Report Status 05/23/2020 FINAL  Final   Organism ID, Bacteria ENTEROCOCCUS FAECALIS  Final      Susceptibility   Enterococcus faecalis - MIC*    AMPICILLIN <=2 SENSITIVE Sensitive     VANCOMYCIN 2 SENSITIVE Sensitive     GENTAMICIN SYNERGY SENSITIVE Sensitive     * RARE ENTEROCOCCUS FAECALIS    RADIOLOGY STUDIES/RESULTS: No results found.  LOS: 9 days   Alberteen Sam, MD  [facility]  To contact the attending provider between 7A-7P or the covering provider during after hours 7P-7A, please log into the web site [email or web address] and access using universal [place] password for that web site. If you do not have the password, please call the hospital operator.  05/24/2020, 5:16 PM

## 2020-05-24 NOTE — Progress Notes (Signed)
   05/24/20 1833  Assess: MEWS Score  Temp 97.9 F (36.6 C)  BP 131/84  Pulse Rate (!) 120  Resp 18  Level of Consciousness Alert  SpO2 95 %  O2 Device Nasal Cannula  Patient Activity (if Appropriate) In bed  O2 Flow Rate (L/min) 2 L/min  Assess: MEWS Score  MEWS Temp 0  MEWS Systolic 0  MEWS Pulse 2  MEWS RR 0  MEWS LOC 0  MEWS Score 2  MEWS Score Color Yellow  Assess: if the MEWS score is Yellow or Red  Were vital signs taken at a resting state? Yes  Focused Assessment Change from prior assessment (see assessment flowsheet)  Early Detection of Sepsis Score *See Row Information* Low  MEWS guidelines implemented *See Row Information* Yes  Treat  MEWS Interventions Escalated (See documentation below)  Pain Scale 0-10  Pain Score 0  Take Vital Signs  Increase Vital Sign Frequency  Yellow: Q 2hr X 2 then Q 4hr X 2, if remains yellow, continue Q 4hrs  Escalate  MEWS: Escalate Yellow: discuss with charge nurse/RN and consider discussing with provider and RRT  Notify: Charge Nurse/RN  Name of Charge Nurse/RN Notified Arley Salamone/kristi thomas, RN  Date Charge Nurse/RN Notified 05/24/20  Time Charge Nurse/RN Notified 1833  Notify: Provider  Provider Name/Title dr Maryfrances Bunnell, MD  Date Provider Notified 05/24/20  Time Provider Notified 1640  Notification Type Call  Notification Reason Change in status (HR tachy in 150s, down to 110s-120s at time of call after vitals check)  Response No new orders (RN to monitor and night shift RN to try metoprolol as ordered if tachy again, not needed at this time per dr danford, pt asymptomatic)  Date of Provider Response 05/24/20  Time of Provider Response 1635

## 2020-05-24 NOTE — Progress Notes (Signed)
Physical Therapy Treatment Patient Details Name: Ann [name] MRN: [id number] DOB: 07/16/1945 Today's Date: 05/24/2020    History of Present Illness      PT Comments    Pt friendly and willing to participate with therapy today.  Pt limited by abdominal pain that was monitored through session.  Pt with increased time and labored movements during bed mobility.  Cueing for handplacement on bed rails assisted with sidelying to sit with min A.  Vitals monitored thorugh session with O2 saturation decreased to 89% following transfer to sitting on EOB.  Cueing for exhale with exertion reduced val salva maneuver and improved saturation O2 level.  Seated therex complete for LE strengthening and minimal UE A for support on bed to assist with seated balance.  Pt declined gait this session due to abdominal pain, was willing to transfer to chair.  Cueing for hand placement to push from bed vs. Pull on walker for safety.  Able to ambulate safely to chair with use of RW.  Cueing for handplacement prior sitting. EOS pt left in chair with call bell within reach and RN aware of status.    Follow Up Recommendations  CIR;Supervision for mobility/OOB;Supervision/Assistance - 24 hour     Equipment Recommendations  None recommended by PT    Recommendations for Other Services       Precautions / Restrictions Precautions Precautions: Fall Precaution Comments: dependent on supplemental O2 Restrictions Weight Bearing Restrictions: No    Mobility  Bed Mobility Overal bed mobility: Modified Independent Bed Mobility: Supine to Sit Rolling: Min assist Sidelying to sit: Min assist (cueing for handplacement of bed guard assisted)       General bed mobility comments: increased time  Transfers Overall transfer level: Needs assistance Equipment used: Rolling walker (2 wheeled) Transfers: Sit to/from Stand Sit to Stand: Min guard         General transfer comment: slow, labored movement.  Encouraged  to exhale during exertion to reduce valsamic maneuver.  Cueing to push from bed vs pull on RW.  Ambulation/Gait Ambulation/Gait assistance: Min assist Gait Distance (Feet): 5 Feet Assistive device: Rolling walker (2 wheeled) Gait Pattern/deviations: Decreased step length - right;Decreased step length - left;Decreased stride length Gait velocity: decreased   General Gait Details: Pt declined gait due to abdominal pain.  Agreed to transfer to chair.   Stairs             Wheelchair Mobility    Modified Rankin (Stroke Patients Only)       Balance                                            Cognition Arousal/Alertness: Awake/alert Behavior During Therapy: WFL for tasks assessed/performed Overall Cognitive Status: Within Functional Limits for tasks assessed                                        Exercises General Exercises - Lower Extremity Long Arc Quad: Seated;AROM;Strengthening;Both;20 reps Hip Flexion/Marching: Seated;AROM;Strengthening;Both;20 reps Toe Raises: Seated;AROM;Strengthening;Both;20 reps Heel Raises: Seated;AROM;Strengthening;Both;20 reps    General Comments        Pertinent Vitals/Pain Pain Assessment: 0-10 Pain Score: 5  Pain Location: abdominal pain Pain Descriptors / Indicators: Grimacing;Guarding Pain Intervention(s): Limited activity within patient's tolerance;Monitored during session;Repositioned    Home Living  Prior Function            PT Goals (current goals can now be found in the care plan section)      Frequency           PT Plan Current plan remains appropriate    Co-evaluation              AM-PAC PT "6 Clicks" Mobility   Outcome Measure  Help needed turning from your back to your side while in a flat bed without using bedrails?: A Little Help needed moving from lying on your back to sitting on the side of a flat bed without using bedrails?: A  Little Help needed moving to and from a bed to a chair (including a wheelchair)?: A Little Help needed standing up from a chair using your arms (e.g., wheelchair or bedside chair)?: A Little Help needed to walk in hospital room?: A Lot Help needed climbing 3-5 steps with a railing? : A Lot 6 Click Score: 16    End of Session Equipment Utilized During Treatment: Oxygen Activity Tolerance: Patient tolerated treatment well;Patient limited by fatigue Patient left: in chair;with call bell/phone within reach Nurse Communication: Mobility status PT Visit Diagnosis: Unsteadiness on feet (R26.81);Other abnormalities of gait and mobility (R26.89);Muscle weakness (generalized) (M62.81)     Time: 1115-1140 PT Time Calculation (min) (ACUTE ONLY): 25 min  Charges:  $Therapeutic Exercise: 8-22 mins $Therapeutic Activity: 8-22 mins                     Ihor Austin, LPTA/CLT; CBIS [phone]  [name] 05/24/2020, 11:50 AM

## 2020-05-24 NOTE — Plan of Care (Signed)

## 2020-05-24 NOTE — Progress Notes (Signed)
[facility] Progress Note  8 Days Post-Op  Subjective: Doing well. Feeling good and tolerating soft diet. Having ostomy output. Hoping for Rehab bed soon.  Objective: Vital signs in last 24 hours: Temp:  [97.4 F (36.3 C)-98.7 F (37.1 C)] 97.6 F (36.4 C) (12/29 0804) Pulse Rate:  [62-133] 96 (12/29 0804) Resp:  [17-20] 18 (12/29 0804) BP: (96-133)/(60-96) 133/96 (12/29 0804) SpO2:  [93 %-100 %] 95 % (12/29 1143) Last BM Date: 05/24/20  Intake/Output from previous day: 12/28 0701 - 12/29 0700 In: 2311.2 [P.O.:720; I.V.:1186.3; IV Piggyback:5] Out: 1400 [Urine:1100; Drains:50; Stool:250] Intake/Output this shift: Total I/O In: -  Out: 50 [Drains:50]  General appearance: alert, cooperative and no distress GI: soft, wound vac in place, good seal, ostomy with sloughing superficial, output in bag  Lab Results:  Recent Labs    05/22/20 0548 05/24/20 0448  WBC 9.5 8.8  HGB 9.6* 9.7*  HCT 33.2* 33.4*  PLT 227 250   BMET Recent Labs    05/22/20 0548 05/24/20 0448  NA 139 141  K 3.5 3.6  CL 97* 101  CO2 33* 32  GLUCOSE 121* 90  BUN 12 9  CREATININE 0.56 0.56  CALCIUM 8.6* 8.5*   PT/INR No results for input(s): LABPROT, INR in the last 72 hours.  Studies/Results: No results found.  Anti-infectives: Anti-infectives (From admission, onward)   Start     Dose/Rate Route Frequency Ordered Stop   05/16/20 0600  cefoTEtan (CEFOTAN) 2 g in sodium chloride 0.9 % 100 mL IVPB        2 g 200 mL/hr over 30 Minutes Intravenous On call to O.R. 05/15/20 1039 05/16/20 0825   05/15/20 1000  piperacillin-tazobactam (ZOSYN) IVPB 3.375 g        3.375 g 12.5 mL/hr over 240 Minutes Intravenous Every 8 hours 05/15/20 0000 05/20/20 0938   05/15/20 0100  piperacillin-tazobactam (ZOSYN) IVPB 3.375 g        3.375 g 12.5 mL/hr over 240 Minutes Intravenous  Once 05/14/20 2357 05/15/20 1322      Assessment/Plan: Ms. Youngberg is a 74 yo with end colostomy for  perforated diverticulitis with abscess. Doing well overall. Going to inpatient rehab and will follow up post op.   Wound vac change with RN today Ostomy care    LOS: 9 days    Lucretia Roers 05/24/2020

## 2020-05-24 NOTE — Progress Notes (Signed)
Inpatient Rehabilitation-Admissions Coordinator   Still no bed open for this patient in CIR today. Will follow up tomorrow for possible admit.   Cheri Rous, OTR/L  Rehab Admissions Coordinator  [phone] 05/24/2020 2:52 PM

## 2020-05-24 NOTE — Progress Notes (Signed)
   05/24/20 1842  Assess: MEWS Score  Pulse Rate (!) 110 (baseline, provider aware)  Assess: MEWS Score  MEWS Temp 0  MEWS Systolic 0  MEWS Pulse 1  MEWS RR 0  MEWS LOC 0  MEWS Score 1  MEWS Score Color Green  Document  Patient Outcome Other (Comment) (continue to monitor per dr danford)

## 2020-05-25 ENCOUNTER — Other Ambulatory Visit: Payer: Self-pay

## 2020-05-25 ENCOUNTER — Inpatient Hospital Stay (HOSPITAL_COMMUNITY)
Admission: RE | Admit: 2020-05-25 | Discharge: 2020-06-02 | DRG: 945 | Disposition: A | Discharge status: Home Health | Payer: Medicare Other | Source: Intra-hospital | Attending: Physical Medicine & Rehabilitation | Admitting: Physical Medicine & Rehabilitation

## 2020-05-25 ENCOUNTER — Encounter (HOSPITAL_COMMUNITY): Payer: Self-pay | Admitting: Physical Medicine & Rehabilitation

## 2020-05-25 DIAGNOSIS — K219 Gastro-esophageal reflux disease without esophagitis: Secondary | ICD-10-CM | POA: Diagnosis present

## 2020-05-25 DIAGNOSIS — H409 Unspecified glaucoma: Secondary | ICD-10-CM | POA: Diagnosis present

## 2020-05-25 DIAGNOSIS — Z9049 Acquired absence of other specified parts of digestive tract: Secondary | ICD-10-CM

## 2020-05-25 DIAGNOSIS — I110 Hypertensive heart disease with heart failure: Secondary | ICD-10-CM | POA: Diagnosis present

## 2020-05-25 DIAGNOSIS — Z933 Colostomy status: Secondary | ICD-10-CM | POA: Diagnosis not present

## 2020-05-25 DIAGNOSIS — I5032 Chronic diastolic (congestive) heart failure: Secondary | ICD-10-CM | POA: Diagnosis present

## 2020-05-25 DIAGNOSIS — N3281 Overactive bladder: Secondary | ICD-10-CM | POA: Diagnosis present

## 2020-05-25 DIAGNOSIS — F32A Depression, unspecified: Secondary | ICD-10-CM | POA: Diagnosis present

## 2020-05-25 DIAGNOSIS — Z794 Long term (current) use of insulin: Secondary | ICD-10-CM

## 2020-05-25 DIAGNOSIS — E2740 Unspecified adrenocortical insufficiency: Secondary | ICD-10-CM | POA: Diagnosis present

## 2020-05-25 DIAGNOSIS — I2510 Atherosclerotic heart disease of native coronary artery without angina pectoris: Secondary | ICD-10-CM | POA: Diagnosis present

## 2020-05-25 DIAGNOSIS — E8809 Other disorders of plasma-protein metabolism, not elsewhere classified: Secondary | ICD-10-CM | POA: Diagnosis not present

## 2020-05-25 DIAGNOSIS — Z800 Family history of malignant neoplasm of digestive organs: Secondary | ICD-10-CM

## 2020-05-25 DIAGNOSIS — Z7952 Long term (current) use of systemic steroids: Secondary | ICD-10-CM

## 2020-05-25 DIAGNOSIS — Z87891 Personal history of nicotine dependence: Secondary | ICD-10-CM

## 2020-05-25 DIAGNOSIS — Z86718 Personal history of other venous thrombosis and embolism: Secondary | ICD-10-CM

## 2020-05-25 DIAGNOSIS — E46 Unspecified protein-calorie malnutrition: Secondary | ICD-10-CM | POA: Diagnosis present

## 2020-05-25 DIAGNOSIS — Z6841 Body Mass Index (BMI) 40.0 and over, adult: Secondary | ICD-10-CM

## 2020-05-25 DIAGNOSIS — Z9981 Dependence on supplemental oxygen: Secondary | ICD-10-CM | POA: Diagnosis not present

## 2020-05-25 DIAGNOSIS — G8929 Other chronic pain: Secondary | ICD-10-CM | POA: Diagnosis present

## 2020-05-25 DIAGNOSIS — E1165 Type 2 diabetes mellitus with hyperglycemia: Secondary | ICD-10-CM

## 2020-05-25 DIAGNOSIS — E785 Hyperlipidemia, unspecified: Secondary | ICD-10-CM | POA: Diagnosis present

## 2020-05-25 DIAGNOSIS — F419 Anxiety disorder, unspecified: Secondary | ICD-10-CM | POA: Diagnosis present

## 2020-05-25 DIAGNOSIS — Z79899 Other long term (current) drug therapy: Secondary | ICD-10-CM

## 2020-05-25 DIAGNOSIS — Z8249 Family history of ischemic heart disease and other diseases of the circulatory system: Secondary | ICD-10-CM

## 2020-05-25 DIAGNOSIS — T380X5A Adverse effect of glucocorticoids and synthetic analogues, initial encounter: Secondary | ICD-10-CM

## 2020-05-25 DIAGNOSIS — Z7901 Long term (current) use of anticoagulants: Secondary | ICD-10-CM | POA: Diagnosis not present

## 2020-05-25 DIAGNOSIS — I482 Chronic atrial fibrillation, unspecified: Secondary | ICD-10-CM | POA: Diagnosis present

## 2020-05-25 DIAGNOSIS — E039 Hypothyroidism, unspecified: Secondary | ICD-10-CM | POA: Diagnosis present

## 2020-05-25 DIAGNOSIS — R7309 Other abnormal glucose: Secondary | ICD-10-CM | POA: Diagnosis not present

## 2020-05-25 DIAGNOSIS — R6521 Severe sepsis with septic shock: Secondary | ICD-10-CM | POA: Diagnosis not present

## 2020-05-25 DIAGNOSIS — N179 Acute kidney failure, unspecified: Secondary | ICD-10-CM | POA: Diagnosis not present

## 2020-05-25 DIAGNOSIS — G4700 Insomnia, unspecified: Secondary | ICD-10-CM | POA: Diagnosis present

## 2020-05-25 DIAGNOSIS — R5381 Other malaise: Principal | ICD-10-CM | POA: Diagnosis present

## 2020-05-25 DIAGNOSIS — E1142 Type 2 diabetes mellitus with diabetic polyneuropathy: Secondary | ICD-10-CM | POA: Diagnosis present

## 2020-05-25 DIAGNOSIS — A409 Streptococcal sepsis, unspecified: Secondary | ICD-10-CM

## 2020-05-25 DIAGNOSIS — J449 Chronic obstructive pulmonary disease, unspecified: Secondary | ICD-10-CM | POA: Diagnosis present

## 2020-05-25 DIAGNOSIS — Z955 Presence of coronary angioplasty implant and graft: Secondary | ICD-10-CM

## 2020-05-25 DIAGNOSIS — E538 Deficiency of other specified B group vitamins: Secondary | ICD-10-CM | POA: Diagnosis present

## 2020-05-25 DIAGNOSIS — R739 Hyperglycemia, unspecified: Secondary | ICD-10-CM

## 2020-05-25 DIAGNOSIS — Z981 Arthrodesis status: Secondary | ICD-10-CM

## 2020-05-25 DIAGNOSIS — D62 Acute posthemorrhagic anemia: Secondary | ICD-10-CM | POA: Diagnosis present

## 2020-05-25 DIAGNOSIS — M1990 Unspecified osteoarthritis, unspecified site: Secondary | ICD-10-CM | POA: Diagnosis present

## 2020-05-25 DIAGNOSIS — I4891 Unspecified atrial fibrillation: Secondary | ICD-10-CM | POA: Diagnosis not present

## 2020-05-25 DIAGNOSIS — Z79890 Hormone replacement therapy (postmenopausal): Secondary | ICD-10-CM

## 2020-05-25 LAB — BASIC METABOLIC PANEL
Anion gap: 9 (ref 5–15)
BUN: 9 mg/dL (ref 8–23)
CO2: 32 mmol/L (ref 22–32)
Calcium: 8.5 mg/dL — ABNORMAL LOW (ref 8.9–10.3)
Chloride: 101 mmol/L (ref 98–111)
Creatinine, Ser: 0.58 mg/dL (ref 0.44–1.00)
GFR, Estimated: >60 mL/min (ref >60)
Glucose, Bld: 118 mg/dL — ABNORMAL HIGH (ref 70–99)
Potassium: 3.8 mmol/L (ref 3.5–5.1)
Sodium: 142 mmol/L (ref 135–145)

## 2020-05-25 LAB — CBC
HCT: 32.4 % — ABNORMAL LOW (ref 36.0–46.0)
Hemoglobin: 9.2 g/dL — ABNORMAL LOW (ref 12.0–15.0)
MCH: 25.1 pg — ABNORMAL LOW (ref 26.0–34.0)
MCHC: 28.4 g/dL — ABNORMAL LOW (ref 30.0–36.0)
MCV: 88.5 fL (ref 80.0–100.0)
Platelets: 267 10*3/uL (ref 150–400)
RBC: 3.66 MIL/uL — ABNORMAL LOW (ref 3.87–5.11)
RDW: 17.3 % — ABNORMAL HIGH (ref 11.5–15.5)
WBC: 9.9 10*3/uL (ref 4.0–10.5)
nRBC: 0 % (ref 0.0–0.2)

## 2020-05-25 LAB — GLUCOSE, CAPILLARY
Glucose-Capillary: 110 mg/dL — ABNORMAL HIGH (ref 70–99)
Glucose-Capillary: 122 mg/dL — ABNORMAL HIGH (ref 70–99)
Glucose-Capillary: 140 mg/dL — ABNORMAL HIGH (ref 70–99)
Glucose-Capillary: 147 mg/dL — ABNORMAL HIGH (ref 70–99)
Glucose-Capillary: 159 mg/dL — ABNORMAL HIGH (ref 70–99)
Glucose-Capillary: 194 mg/dL — ABNORMAL HIGH (ref 70–99)

## 2020-05-25 MED ORDER — PANTOPRAZOLE SODIUM 40 MG PO TBEC
40.0000 mg | DELAYED_RELEASE_TABLET | Freq: Every day | ORAL | Status: DC
  Administered 2020-05-26 – 2020-06-02 (×8): 40 mg via ORAL
  Filled 2020-05-25 (×4): qty 1
  Filled 2020-05-25: qty 2
  Filled 2020-05-25 (×3): qty 1

## 2020-05-25 MED ORDER — OXYCODONE HCL 5 MG PO TABS
5.0000 mg | ORAL_TABLET | ORAL | Status: DC | PRN
  Administered 2020-05-28: 5 mg via ORAL
  Filled 2020-05-25 (×3): qty 1

## 2020-05-25 MED ORDER — DARIFENACIN HYDROBROMIDE ER 7.5 MG PO TB24
7.5000 mg | ORAL_TABLET | Freq: Every day | ORAL | Status: DC
  Administered 2020-05-26 – 2020-06-02 (×8): 7.5 mg via ORAL
  Filled 2020-05-25 (×8): qty 1

## 2020-05-25 MED ORDER — INSULIN ASPART 100 UNIT/ML ~~LOC~~ SOLN: 0.0000 [IU] | SUBCUTANEOUS | 11 refills | Status: DC

## 2020-05-25 MED ORDER — OXYCODONE HCL 5 MG PO TABS: 5.0000 mg | ORAL_TABLET | ORAL | 0 refills | Status: DC | PRN

## 2020-05-25 MED ORDER — DILTIAZEM HCL ER COATED BEADS 180 MG PO CP24: 300.0000 mg | ORAL_CAPSULE | Freq: Every day | ORAL | Status: DC

## 2020-05-25 MED ORDER — LEVOTHYROXINE SODIUM 75 MCG PO TABS
150.0000 ug | ORAL_TABLET | Freq: Every day | ORAL | Status: DC
  Administered 2020-05-26: 150 ug via ORAL
  Filled 2020-05-25: qty 2

## 2020-05-25 MED ORDER — INSULIN ASPART 100 UNIT/ML ~~LOC~~ SOLN
0.0000 [IU] | SUBCUTANEOUS | Status: DC
  Administered 2020-05-25: 18:00:00 2 [IU] via SUBCUTANEOUS
  Administered 2020-05-25: 1 [IU] via SUBCUTANEOUS
  Administered 2020-05-26 (×2): 2 [IU] via SUBCUTANEOUS
  Administered 2020-05-26: 3 [IU] via SUBCUTANEOUS
  Administered 2020-05-27 (×2): 1 [IU] via SUBCUTANEOUS
  Administered 2020-05-27: 2 [IU] via SUBCUTANEOUS
  Administered 2020-05-27: 3 [IU] via SUBCUTANEOUS
  Administered 2020-05-27: 2 [IU] via SUBCUTANEOUS
  Administered 2020-05-28: 3 [IU] via SUBCUTANEOUS
  Administered 2020-05-28 (×3): 1 [IU] via SUBCUTANEOUS

## 2020-05-25 MED ORDER — NITROGLYCERIN 0.4 MG SL SUBL: 0.4000 mg | SUBLINGUAL_TABLET | SUBLINGUAL | Status: DC | PRN

## 2020-05-25 MED ORDER — ONDANSETRON HCL 4 MG/2ML IJ SOLN: 4.0000 mg | Freq: Four times a day (QID) | INTRAMUSCULAR | Status: DC | PRN

## 2020-05-25 MED ORDER — UMECLIDINIUM BROMIDE 62.5 MCG/INH IN AEPB
1.0000 | INHALATION_SPRAY | Freq: Every day | RESPIRATORY_TRACT | Status: DC
  Administered 2020-05-26 – 2020-06-02 (×7): 1 via RESPIRATORY_TRACT

## 2020-05-25 MED ORDER — MIRABEGRON ER 50 MG PO TB24
50.0000 mg | ORAL_TABLET | Freq: Every day | ORAL | Status: DC
  Administered 2020-05-26 – 2020-06-02 (×8): 50 mg via ORAL
  Filled 2020-05-25 (×8): qty 1

## 2020-05-25 MED ORDER — DILTIAZEM HCL ER COATED BEADS 240 MG PO CP24: 240.0000 mg | ORAL_CAPSULE | Freq: Every day | ORAL | Status: DC

## 2020-05-25 MED ORDER — GABAPENTIN 400 MG PO CAPS
400.0000 mg | ORAL_CAPSULE | Freq: Three times a day (TID) | ORAL | Status: DC
  Administered 2020-05-25 – 2020-06-02 (×24): 400 mg via ORAL
  Filled 2020-05-25 (×25): qty 1

## 2020-05-25 MED ORDER — ALBUTEROL SULFATE (2.5 MG/3ML) 0.083% IN NEBU: 3.0000 mL | INHALATION_SOLUTION | RESPIRATORY_TRACT | Status: DC | PRN

## 2020-05-25 MED ORDER — FUROSEMIDE 40 MG PO TABS
40.0000 mg | ORAL_TABLET | Freq: Two times a day (BID) | ORAL | Status: DC
  Administered 2020-05-25 – 2020-06-02 (×16): 40 mg via ORAL
  Filled 2020-05-25 (×16): qty 1

## 2020-05-25 MED ORDER — TRAZODONE HCL 50 MG PO TABS: 50.0000 mg | ORAL_TABLET | Freq: Every evening | ORAL | Status: DC | PRN

## 2020-05-25 MED ORDER — ACETAMINOPHEN 650 MG RE SUPP
650.0000 mg | Freq: Four times a day (QID) | RECTAL | Status: DC | PRN
Start: 2020-05-25 — End: 2020-06-02

## 2020-05-25 MED ORDER — INSULIN DETEMIR 100 UNIT/ML ~~LOC~~ SOLN
18.0000 [IU] | Freq: Every day | SUBCUTANEOUS | Status: DC
  Administered 2020-05-25 – 2020-06-01 (×8): 18 [IU] via SUBCUTANEOUS
  Filled 2020-05-25 (×10): qty 0.18

## 2020-05-25 MED ORDER — ONDANSETRON HCL 4 MG PO TABS: 4.0000 mg | ORAL_TABLET | Freq: Four times a day (QID) | ORAL | Status: DC | PRN

## 2020-05-25 MED ORDER — ATORVASTATIN CALCIUM 80 MG PO TABS
80.0000 mg | ORAL_TABLET | Freq: Every day | ORAL | Status: DC
  Administered 2020-05-25 – 2020-06-01 (×8): 80 mg via ORAL
  Filled 2020-05-25 (×8): qty 1

## 2020-05-25 MED ORDER — PREDNISONE 5 MG PO TABS
10.0000 mg | ORAL_TABLET | Freq: Every day | ORAL | Status: DC
  Administered 2020-05-26 – 2020-06-02 (×8): 10 mg via ORAL
  Filled 2020-05-25 (×8): qty 2

## 2020-05-25 MED ORDER — VITAMIN D 25 MCG (1000 UNIT) PO TABS
5000.0000 [IU] | ORAL_TABLET | Freq: Every day | ORAL | Status: DC
  Administered 2020-05-26 – 2020-06-02 (×8): 5000 [IU] via ORAL
  Filled 2020-05-25 (×9): qty 5

## 2020-05-25 MED ORDER — PANTOPRAZOLE SODIUM 40 MG PO TBEC: 40.0000 mg | DELAYED_RELEASE_TABLET | Freq: Every day | ORAL | Status: DC

## 2020-05-25 MED ORDER — VITAMIN B-12 1000 MCG PO TABS
1000.0000 ug | ORAL_TABLET | Freq: Every day | ORAL | Status: DC
  Administered 2020-05-26 – 2020-06-02 (×8): 1000 ug via ORAL
  Filled 2020-05-25 (×8): qty 1

## 2020-05-25 MED ORDER — POLYVINYL ALCOHOL 1.4 % OP SOLN
2.0000 [drp] | Freq: Every day | OPHTHALMIC | Status: DC | PRN
  Administered 2020-05-27 – 2020-06-01 (×4): 2 [drp] via OPHTHALMIC
  Filled 2020-05-25: qty 15

## 2020-05-25 MED ORDER — DILTIAZEM HCL ER 60 MG PO CP12
60.0000 mg | ORAL_CAPSULE | Freq: Once | ORAL | Status: DC
  Filled 2020-05-25: qty 1

## 2020-05-25 MED ORDER — DILTIAZEM HCL ER COATED BEADS 120 MG PO CP24
240.0000 mg | ORAL_CAPSULE | Freq: Every day | ORAL | Status: DC
  Administered 2020-05-26 – 2020-06-02 (×7): 240 mg via ORAL
  Filled 2020-05-25 (×8): qty 2

## 2020-05-25 MED ORDER — APIXABAN 5 MG PO TABS
5.0000 mg | ORAL_TABLET | Freq: Two times a day (BID) | ORAL | Status: DC
  Administered 2020-05-25 – 2020-06-02 (×16): 5 mg via ORAL
  Filled 2020-05-25 (×17): qty 1

## 2020-05-25 MED ORDER — SERTRALINE HCL 50 MG PO TABS
50.0000 mg | ORAL_TABLET | Freq: Every day | ORAL | Status: DC
  Administered 2020-05-25 – 2020-06-01 (×8): 50 mg via ORAL
  Filled 2020-05-25 (×9): qty 1

## 2020-05-25 MED ORDER — LEVEMIR FLEXTOUCH 100 UNIT/ML ~~LOC~~ SOPN: 20.0000 [IU] | PEN_INJECTOR | Freq: Every day | SUBCUTANEOUS | 0 refills | Status: DC

## 2020-05-25 MED ORDER — ACETAMINOPHEN 325 MG PO TABS
650.0000 mg | ORAL_TABLET | Freq: Four times a day (QID) | ORAL | Status: DC | PRN
  Administered 2020-05-27 – 2020-06-01 (×5): 650 mg via ORAL
  Filled 2020-05-25 (×6): qty 2

## 2020-05-25 NOTE — IPOC Note (Signed)
Individualized overall Plan of Care [facility]) Patient Details Name: Ann [name] MRN: [id number] DOB: 10-05-1945  Admitting Diagnosis: Debility  Hospital Problems: Principal Problem:   Debility Active Problems:   Hypoalbuminemia due to protein-calorie malnutrition (HCC)   Chronic diastolic congestive heart failure (HCC)   Acute blood loss anemia     Functional Problem List: Nursing Bladder,Bowel,Nutrition,Medication Management,Safety,Skin Integrity,Pain,Edema,Endurance  PT Balance,Endurance,Motor,Pain,Skin Integrity  OT Balance,Endurance,Pain  SLP    TR         Basic ADL's: OT Grooming,Bathing,Dressing,Toileting     Advanced  ADL's: OT       Transfers: PT Bed Mobility,Bed to Chair,Car,Furniture  OT Toilet,Tub/Shower     Locomotion: PT Ambulation,Wheelchair Mobility,Stairs     Additional Impairments: OT None  SLP        TR      Anticipated Outcomes Item Anticipated Outcome  Self Feeding    Swallowing      Basic self-care  Supervision  Toileting  Supervision   Bathroom Transfers Supervision  Bowel/Bladder  manage with min assist  Transfers  supervision with LRAD  Locomotion  supervision with LRAD  Communication     Cognition     Pain  pain at or below level 4  Safety/Judgment  maintain safety with cues/reminders   Therapy Plan: PT Intensity: Minimum of 1-2 x/day ,45 to 90 minutes PT Frequency: 5 out of 7 days PT Duration Estimated Length of Stay: 7-10 OT Intensity: Minimum of 1-2 x/day, 45 to 90 minutes OT Frequency: 5 out of 7 days OT Duration/Estimated Length of Stay: 7-10 days      Team Interventions: Nursing Interventions Patient/Family Education,Bowel Management,Pain Management,Bladder Management,Medication Management,Discharge Planning,Skin Care/Wound Management,Disease Management/Prevention  PT interventions Ambulation/gait training,Discharge planning,Functional mobility training,Psychosocial support,Therapeutic  Activities,Visual/perceptual remediation/compensation,Balance/vestibular training,Disease management/prevention,Neuromuscular re-education,Skin care/wound Environmental consultant propulsion/positioning,DME/adaptive equipment instruction,Pain management,Splinting/orthotics,UE/LE Strength taining/ROM,Community reintegration,Functional electrical stimulation,Patient/family education,Stair training,UE/LE Coordination activities  OT Interventions Balance/vestibular training,Community reintegration,Discharge planning,Disease Psychologist, occupational instruction,Functional mobility training,Pain management,Patient/family education,Psychosocial support,Self Care/advanced ADL retraining,Skin care/wound managment,Therapeutic Activities,Therapeutic Exercise,UE/LE Strength taining/ROM  SLP Interventions    TR Interventions    SW/CM Interventions Discharge Planning,Psychosocial Support,Patient/Family Education   Barriers to Discharge MD  Medical stability and Weight  Nursing Decreased caregiver support,Wound Care    PT Incontinence,Wound Care,Weight Pt is incontinent, has abdominal wound, and has poor activity tolerance  OT Incontinence    SLP      SW       Team Discharge Planning: Destination: PT-Home ,OT- Home , SLP-  Projected Follow-up: PT-Home health PT, OT-  Home health OT,24 hour supervision/assistance, SLP-  Projected Equipment Needs: PT-To be determined, OT- To be determined, SLP-  Equipment Details: PT-has rollator, manual WC, and hospital bed, OT-  Patient/family involved in discharge planning: PT- Patient,  OT-Patient, SLP-   MD ELOS: 7-10 days. Medical Rehab Prognosis:  Good Assessment: 74 year old right-handed female with history of chronic anemia, chronic diastolic congestive heart failure, CAD and prior stenting, prior history of diverticulosis/diverticulitis, GERD and erosive gastritis, chronic atrial fibrillation on anticoagulation with Eliquis,  history of adrenal insufficiency maintained on chronic prednisone, hypothyroidism, COPD, type 2 diabetes mellitus, morbid obesity with BMI 49.68, hypertension.  Presented 05/14/2020 with abdominal pain x24 hours associated chills with nausea, hematochezia but no emesis.  CT abdomen pelvis showed sigmoid diverticulitis with contained perforation measuring 6.9 x 4.5 cm.  No evidence of abscess.  Admission chemistries glucose 128 creatinine 1.07, hemoglobin 8.5, WBC 16,900, lactic acid 2.5, blood cultures no growth to date.  Patient underwent partial colectomy splenic flexure takedown and  end colostomy for perforated sigmoid diverticulitis 05/16/2020 per Dr. Blake Divine and wound VAC applied.  Hospital course complicated by acute blood loss anemia and hypotension requiring transfer to the ICU for monitoring.  Latest hemoglobin 9.2.  Her chronic Eliquis that she had been on for atrial fibrillation initially held for surgical procedure has been been resumed.  Her diet has been advanced to mechanical soft.  Due to patient's debilitation related to perforation diverticulitis needing assist for ADLs she was admitted for a comprehensive rehab program. Patient with resulting functional deficits with mobility, endurance, self-care. We will set goals for Supervision with PT/OT.  Due to the current state of emergency, patients may not be receiving their 3-hours of Medicare-mandated therapy.  See Team Conference Notes for weekly updates to the plan of care

## 2020-05-25 NOTE — Progress Notes (Signed)
Inpatient Rehabilitation Medication Review by a Pharmacist  A complete drug regimen review was completed for this patient to identify any potential clinically significant medication issues.  Clinically significant medication issues were identified:  yes   Type of Medication Issue Identified Description of Issue Urgent (address now) Non-Urgent (address on AM team rounds) Plan Plan Accepted by Provider? (Yes / No / Pending AM Rounds)  Drug Interaction(s) (clinically significant)       Duplicate Therapy       Allergy       No Medication Administration End Date       Incorrect Dose       Additional Drug Therapy Needed       Other: Uncertain Levothyroxine dose.  175 mcg versus 150 mcg PO daily.  Discharge summary from [facility] indicates dose is 175 mcg po daily.  However, patient was taking 150 mcg daily at [facility] inpatient. Patient is uncertain of dose.  Pharmacist will contact      :    For non-urgent medication issues to be resolved on team rounds tomorrow morning. We wil contact MD or PA tomorrow 05/26/20.    Pharmacist comments: PTA meds indicate she was taking levothyroxine 175 mcg PO daily.  At [facility] prior to transfer to [facility], she was taking 150 mcg daily during inpatient stay. I contacted [facility] pharmacy who reports that Levothyroxine 175 mcg po daily, last filled 03/17/20. Patient is incertain of her dose.  Will contact daughter-in-law, Pattricia Weiher with patient's permission,  who helps patient with her medications.   [facility] pharmacist was contacted and reports last filled levothyroxyzine 175 mcg daily on 03/17/20.  However, the patient reports [facility].   Time spent performing this drug regimen review (minutes): 15  Noah Delaine, [place] Clinical Pharmacist Please check AMION for all [facility] phone numbers After 10:00 PM, call Main Pharmacy [phone] 12/30/202 3:33 PM

## 2020-05-25 NOTE — Progress Notes (Signed)
Inpatient Rehabilitation-Admissions Coordinator   Transport with [facility] scheduled. They reported they will send out a bus shortly for pick up. No specific time given.   RN can call [phone] for report  Cheri Rous, OTR/L  Rehab Admissions Coordinator  [phone] 05/25/2020 10:59 AM

## 2020-05-25 NOTE — Progress Notes (Signed)
Inpatient Rehabilitation-Admissions Coordinator   Spoke to pt via phone to notify her of bed offer. Pt has accepted. Received medical clearance from Dr. Maryfrances Bunnell for admit to CIR today. Reviewed insurance benefits letter and consent forms with pt. TOC and RN notified of plan for admit today. Will update once transport has been scheduled.   Please call if questions.   Cheri Rous, OTR/L  Rehab Admissions Coordinator  [phone] 05/25/2020 10:28 AM

## 2020-05-25 NOTE — Care Management Important Message (Signed)
Important Message  Patient Details  Name: Ann [name] MRN: [id number] Date of Birth: 12/27/1945   Medicare Important Message Given:  Yes     Corey Harold 05/25/2020, 11:08 AM

## 2020-05-25 NOTE — Consult Note (Signed)
WOC Nurse ostomy follow up Patient receiving care in [facility] 4W01. Patient was seen by [facility] nurse earlier this week.  Orders are in the record for bedside nurse to perform VAC dressing changes.  Orders are in record for ostomy pouching supplies for patient.  I have requested that the WOC team have Tuesdays and Fridays blocked off for ostomy care and teaching from 10-11.  Charge Nurse Stanton Kidney informs me the [facility] machine is already on for the abdominal wound. Helmut Muster, RN, MSN, CWOCN, CNS-BC, pager [phone]

## 2020-05-25 NOTE — Discharge Summary (Signed)
Physician Discharge Summary  Ann [name] [id number] DOB: 1946/03/10 DOA: 05/14/2020  PCP: Rosita Fire, MD  Admit date: 05/14/2020 Discharge date: 05/25/2020  Admitted From: Home  Disposition:  CIR   Recommendations for Outpatient Follow-up:  1. Follow up with General Surgery Dr. Constance Haw as soon as able after discharge from rehab 2. CIR: Please obtain BMP to check Cr, K 3. CIR: Please discharge patient with new mealtime insulin if still needed while in rehab 4. CIR: With regard to rate control of atrial fibrillation, please utilize symptoms and resting HR to guide titration (she has returned to her home dose of diltiazem at the time of discharge)  Home Health: N/A  Equipment/Devices: Wound vac, otherwise TBD at CIR  Discharge Condition: Fair  CODE STATUS: DNR Diet recommendation: Cardiac, diabetic  Brief/Interim Summary: Ann [name] is a 74 y.o. F with dCHF, cAF on Eliquis, history secondary adrenal insufficiency not on maintenance steroids, COPD, obesity, OHS, and DM who presented with left lower quadrant abdominal pain and hematochezia.  In the ER, CT imaging showed sigmoid diverticulitis with perforation.  Admitted and started on antibiotics.  Subsequently developed acute blood loss anemia and hypotension requiring transfer to ICU prior to surgery.       PRINCIPAL HOSPITAL DIAGNOSIS: Shock, combined hypovolemic due to acute blood loss and septic shock    Discharge Diagnoses:   Septic and hypovolemic shock Septic shock due to Enterococcus, and perforated diverticulitis Hypovolemic shock due to acute blood loss anemia and secondary adrenal insufficiency  Patient admitted with perforated diverticulitis.  Started on empiric Zosyn.  On hospital day 2, developed shock requiring IV steroids, packed red cell transfusion and transferred to ICU.  On hospital day 3, patient underwent colectomy with colostomy placement on 12/21 by Dr. Constance Haw.  Postoperative cultures  growing Enterococcus, and the patient completed 5 days of appropriate therapy with resolution of leukocytosis, pain, no new fever.   Lower GI bleed Acute blood loss anemia Patient developed hematochezia/bright red blood per rectum prior to surgery.  Transfused total 3 units, blood pressure improved, hemoglobin stable after transfusion.  Her blood thinner was able to be resumed postoperatively.     Chronic atrial fibrillation Postoperatively, patient had some rapid ventricular rate, due to cessation of her home oral Cardizem.  Titrated back to her home dose, and electrolytes were repleted, and her heart rates were controlled at rest.  Further titration of her rate control (i.e. addition of a beta-blocker perhaps if needed) should be guided in the outpatient setting with resting heart rate and symptoms.  Chronic diastolic CHF Coronary artery disease Euvolemic.  Continue home Lasix.  Acute kidney injury Baseline Cr 0.6.  Admitted here with Cr doubled from baseline 1.4, improved to baseline by time of discharge.  Hypothyroidism  COPD No active disease  Diabetes, well controlled, with polyneuropathy Continue Levemir, titrate as needed.  Continue statin and gabapentin.  Morbid obesity BMI 49         Discharge Instructions  Discharge Instructions    Diet - low sodium heart healthy   Complete by: As directed    Discharge wound care:   Complete by: As directed    To be changed by Dr. Constance Haw after rehab     Allergies as of 05/25/2020      Reactions   Ace Inhibitors Other (See Comments)   unknown   Asa [aspirin]    Causes bleeding   Tape Other (See Comments)   Skin tearing, causes scars   Niacin Rash  Reglan [metoclopramide] Anxiety      Medication List    STOP taking these medications   hydrALAZINE 50 MG tablet Commonly known as: APRESOLINE     TAKE these medications   acetaminophen 325 MG tablet Commonly known as: TYLENOL Take 2 tablets (650 mg  total) by mouth every 6 (six) hours as needed for mild pain, moderate pain or fever.   albuterol 108 (90 Base) MCG/ACT inhaler Commonly known as: VENTOLIN HFA Inhale 2 puffs into the lungs every 4 (four) hours as needed for wheezing.   apixaban 5 MG Tabs tablet Commonly known as: ELIQUIS Take 1 tablet (5 mg total) by mouth 2 (two) times daily.   atorvastatin 80 MG tablet Commonly known as: LIPITOR Take 1 tablet (80 mg total) by mouth daily at 6 PM.   Carboxymethylcellulose Sod PF 0.5 % Soln Place 2 drops into both eyes daily as needed (for dry eye relief).   cyanocobalamin 1000 MCG tablet Take 1 tablet (1,000 mcg total) by mouth daily.   diltiazem 300 MG 24 hr capsule Commonly known as: CARDIZEM CD Take 1 capsule (300 mg total) by mouth daily.   Docusate Sodium 100 MG capsule Take 100 mg by mouth daily.   esomeprazole 40 MG capsule Commonly known as: NexIUM Take 1 capsule (40 mg total) by mouth 2 (two) times daily before a meal.   furosemide 40 MG tablet Commonly known as: Lasix Take 1 tablet (40 mg total) by mouth 2 (two) times daily. What changed: how much to take   gabapentin 400 MG capsule Commonly known as: NEURONTIN Take 400 mg by mouth 3 (three) times daily.   insulin aspart 100 UNIT/ML injection Commonly known as: novoLOG Inject 0-9 Units into the skin every 4 (four) hours.   ipratropium-albuterol 0.5-2.5 (3) MG/3ML Soln Commonly known as: DUONEB 3 mLs every 6 (six) hours as needed (copd).   Lancets Misc Used 2 daily   Levemir FlexTouch 100 UNIT/ML FlexPen Generic drug: insulin detemir Inject 20 Units into the skin at bedtime. What changed:   how much to take  when to take this   levothyroxine 175 MCG tablet Commonly known as: SYNTHROID Take 175 mcg by mouth every morning. What changed: Another medication with the same name was removed. Continue taking this medication, and follow the directions you see here.   mirabegron ER 50 MG Tb24  tablet Commonly known as: MYRBETRIQ Take 50 mg by mouth daily.   nitroGLYCERIN 0.4 MG SL tablet Commonly known as: NITROSTAT Place 1 tablet (0.4 mg total) under the tongue every 5 (five) minutes as needed for chest pain.   ondansetron 4 MG tablet Commonly known as: ZOFRAN Take 4 mg by mouth every 8 (eight) hours as needed.   oxyCODONE 5 MG immediate release tablet Commonly known as: Oxy IR/ROXICODONE Take 1 tablet (5 mg total) by mouth every 4 (four) hours as needed for moderate pain.   Pen Needles 31G X 5 MM Misc 1 each by Does not apply route 2 (two) times daily. Use two times daily to inject insulin   predniSONE 10 MG tablet Commonly known as: DELTASONE Take 1 tablet (10 mg total) by mouth daily with breakfast.   sertraline 100 MG tablet Commonly known as: ZOLOFT Take 0.5 tablets (50 mg total) by mouth at bedtime.   solifenacin 10 MG tablet Commonly known as: VESICARE Take 10 mg by mouth at bedtime.   Spiriva Respimat 2.5 MCG/ACT Aers Generic drug: Tiotropium Bromide Monohydrate Inhale 2 puffs into the  lungs daily.   SUMAtriptan 100 MG tablet Commonly known as: IMITREX Take 100 mg by mouth every 2 (two) hours as needed.   Vitamin D3 125 MCG (5000 UT) Caps Take 1 capsule (5,000 Units total) by mouth daily.            Discharge Care Instructions  (From admission, onward)         Start     Ordered   05/25/20 0000  Discharge wound care:       Comments: To be changed by Dr. Henreitta Leber after rehab   05/25/20 1018          Follow-up Information    Avon Gully, MD Follow up.   Specialty: Internal Medicine Contact information: [address] [place] [address] [phone]        Antoine Poche, MD Follow up.   Specialty: Cardiology Contact information: [address] [place] [address] [phone]        Lucretia Roers, MD. Schedule an appointment as soon as possible for a visit in 1 week(s).   Specialty: General  Surgery Contact information: [address] [place] [facility] [address] [phone]              Allergies  Allergen Reactions  . Ace Inhibitors Other (See Comments)    unknown  . Asa [Aspirin]     Causes bleeding  . Tape Other (See Comments)    Skin tearing, causes scars  . Niacin Rash  . Reglan [Metoclopramide] Anxiety    Consultations:  General Surgery   Procedures/Studies: CT ABDOMEN PELVIS W CONTRAST  Result Date: 05/14/2020 CLINICAL DATA:  Lower abdominal pain, rectal bleeding EXAM: CT ABDOMEN AND PELVIS WITH CONTRAST TECHNIQUE: Multidetector CT imaging of the abdomen and pelvis was performed using the standard protocol following bolus administration of intravenous contrast. CONTRAST:  OMNIPAQUE IOHEXOL 300 MG/ML  SOLN COMPARISON:  06/17/2017 FINDINGS: Lower chest: Trace bilateral pleural effusions. No acute airspace disease. Hepatobiliary: No focal liver abnormality is seen. Status post cholecystectomy. No biliary dilatation. Pancreas: Unremarkable. No pancreatic ductal dilatation or surrounding inflammatory changes. Spleen: Normal in size without focal abnormality. Adrenals/Urinary Tract: Adrenal glands are unremarkable. Kidneys are normal, without renal calculi, focal lesion, or hydronephrosis. Bladder is unremarkable. Stomach/Bowel: There is marked inflammation of the mid sigmoid colon, with wall thickening and pericolonic fat stranding. Findings are consistent with sigmoid diverticulitis. Additionally, there is a large contained perforation containing gas and stool on image 66, measuring 6.9 x 4.5 cm. No evidence of abscess at this time. No bowel obstruction or ileus. Vascular/Lymphatic: Aortic atherosclerosis. No enlarged abdominal or pelvic lymph nodes. Reproductive: Status post hysterectomy. No adnexal masses. Other: No free fluid or free intraperitoneal gas. No abdominal wall hernia. Musculoskeletal: No acute or destructive bony lesions. Significant multilevel  lumbar spondylosis greatest at L2-3 and L3-4. Reconstructed images demonstrate no additional findings. IMPRESSION: 1. Sigmoid diverticulitis, with contained perforation measuring 6.9 x 4.5 cm as above. No evidence of abscess at this time. 2.  Aortic Atherosclerosis (ICD10-I70.0). Critical Value/emergent results were called by telephone at the time of interpretation on 05/14/2020 at 7:39 pm to provider NP [facility] , who verbally acknowledged these results. Electronically Signed   By: Sharlet Salina M.D.   On: 05/14/2020 19:43   DG CHEST PORT 1 VIEW  Result Date: 05/15/2020 CLINICAL DATA:  PICC line placement EXAM: PORTABLE CHEST 1 VIEW COMPARISON:  Portable exam 1145 hours compared 01/23/2020 FINDINGS: LEFT arm PICC line tip projects over SVC.  Enlargement of cardiac silhouette. Atherosclerotic calcification aorta. Pulmonary vascularity normal. Mediastinum appears prominent but this may be related to AP technique. Subsegmental atelectasis at mid LEFT lung and minimally RIGHT base. No infiltrate, pleural effusion or pneumothorax. Bones demineralized with BILATERAL glenohumeral degenerative changes and prior cervical fusion. IMPRESSION: Tip of LEFT arm PICC line projects over SVC. Enlargement of cardiac silhouette. Mild scattered atelectasis. Electronically Signed   By: Lavonia Dana M.D.   On: 05/15/2020 11:56   DG ABD ACUTE 2+V W 1V CHEST  Result Date: 05/19/2020 CLINICAL DATA:  The reason for exam is stated as GI bleeding. Best obtainable images. Recent colostomy. EXAM: DG ABDOMEN ACUTE WITH 1 VIEW CHEST COMPARISON:  None. FINDINGS: Anterior cervical fusion. Normal cardiac silhouette. Low lung volumes. Bilateral streaky pulmonary opacities. Low lung volumes. Probable pleural effusions. No dilated large or small bowel.  Gas in the rectum. IMPRESSION: 1. No evidence of bowel obstruction. 2. Low lung volumes with streaky bilateral airspace opacities. Cannot exclude pneumonia. Probable effusions.  Electronically Signed   By: Suzy Bouchard M.D.   On: 05/19/2020 08:21   [place] EKG SITE RITE  Result Date: 05/15/2020 If Site Rite image not attached, placement could not be confirmed due to current cardiac rhythm.      Subjective: Patient has some mild right lower quadrant pain, but this is tolerable, well controlled with antibiotics.  No fever, no dyspnea.  She has a little exertional intolerance but this is improving.  No vomiting, no blood in ostomy.         Discharge Exam: Vitals:   05/25/20 0418 05/25/20 0902  BP: 123/64   Pulse: 64   Resp: 16   Temp: 97.8 F (36.6 C)   SpO2: 97% 98%   Vitals:   05/24/20 2006 05/24/20 2039 05/25/20 0418 05/25/20 0902  BP:  130/80 123/64   Pulse:  97 64   Resp:  20 16   Temp:  99.1 F (37.3 C) 97.8 F (36.6 C)   TempSrc:  Oral    SpO2: 97% 98% 97% 98%  Weight:      Height:        General: Pt is alert, awake, not in acute distress, lying in bed Cardiovascular: RRR, nl S1-S2, no murmurs appreciated.   No LE edema.   Respiratory: Normal respiratory rate and rhythm.  CTAB without rales or wheezes. Abdominal: Abdomen soft and non-tender.  No distension or HSM.   Neuro/Psych: Strength symmetric in upper and lower extremities.  Judgment and insight appear normal.   The results of significant diagnostics from this hospitalization (including imaging, microbiology, ancillary and laboratory) are listed below for reference.     Microbiology: Recent Results (from the past 240 hour(s))  Culture, blood (routine x 2)     Status: None   Collection Time: 05/15/20 10:26 AM   Specimen: BLOOD RIGHT HAND  Result Value Ref Range Status   Specimen Description BLOOD RIGHT HAND  Final   Special Requests   Final    BOTTLES DRAWN AEROBIC AND ANAEROBIC Blood Culture results may not be optimal due to an inadequate volume of blood received in culture bottles   Culture   Final    NO GROWTH 5 DAYS Performed at [facility], [address],  [place], [place] [address]    Report Status 05/20/2020 FINAL  Final  Culture, blood (routine x 2)     Status: None   Collection Time: 05/15/20 10:31 AM   Specimen: BLOOD LEFT HAND  Result Value  Ref Range Status   Specimen Description BLOOD LEFT HAND  Final   Special Requests   Final    BOTTLES DRAWN AEROBIC AND ANAEROBIC Blood Culture adequate volume   Culture   Final    NO GROWTH 5 DAYS Performed at [facility], [address] [place], [place] [address]    Report Status 05/20/2020 FINAL  Final  Aerobic/Anaerobic Culture (surgical/deep wound)     Status: None   Collection Time: 05/16/20 10:09 AM   Specimen: PATH Cytology Peritoneal fluid; Body Fluid  Result Value Ref Range Status   Specimen Description   Final    PERITONEAL Performed at [facility], [address], [place], [place] [address]    Special Requests   Final    NONE Performed at [place] [place], [place] [address]    Gram Stain   Final    FEW WBC PRESENT,BOTH PMN AND MONONUCLEAR RARE GRAM VARIABLE ROD Performed at [facility], [place] [address] [place], [place] [address]    Culture   Final    ABUNDANT BACTEROIDES FRAGILIS BETA LACTAMASE POSITIVE RARE ENTEROCOCCUS FAECALIS    Report Status 05/23/2020 FINAL  Final   Organism ID, Bacteria ENTEROCOCCUS FAECALIS  Final      Susceptibility   Enterococcus faecalis - MIC*    AMPICILLIN <=2 SENSITIVE Sensitive     VANCOMYCIN 2 SENSITIVE Sensitive     GENTAMICIN SYNERGY SENSITIVE Sensitive     * RARE ENTEROCOCCUS FAECALIS     Labs: BNP (last 3 results) Recent Labs    07/13/19 1257 12/10/19 1355 01/23/20 1600  BNP 229.0* 268.0* 0000000*   Basic Metabolic Panel: Recent Labs  Lab 05/19/20 0421 05/20/20 0729 05/22/20 0548 05/24/20 0448 05/25/20 0543  NA 140 140 139 141 142  K 3.1* 4.4 3.5 3.6 3.8  CL 100 101 97* 101 101  CO2 31 33* 33* 32 32  GLUCOSE 114* 154* 121* 90 118*  BUN 13 12 12 9 9   CREATININE 0.65 0.54 0.56 0.56 0.58   CALCIUM 8.6* 8.9 8.6* 8.5* 8.5*  MG  --  1.7  --  2.1  --    Liver Function Tests: No results for input(s): AST, ALT, ALKPHOS, BILITOT, PROT, ALBUMIN in the last 168 hours. No results for input(s): LIPASE, AMYLASE in the last 168 hours. No results for input(s): AMMONIA in the last 168 hours. CBC: Recent Labs  Lab 05/19/20 0421 05/20/20 0729 05/22/20 0548 05/24/20 0448 05/25/20 0543  WBC 10.0 11.9* 9.5 8.8 9.9  HGB 9.5* 9.8* 9.6* 9.7* 9.2*  HCT 32.5* 34.3* 33.2* 33.4* 32.4*  MCV 86.2 86.2 86.7 87.4 88.5  PLT 179 189 227 250 267   Cardiac Enzymes: No results for input(s): CKTOTAL, CKMB, CKMBINDEX, TROPONINI in the last 168 hours. BNP: Invalid input(s): POCBNP CBG: Recent Labs  Lab 05/24/20 1626 05/24/20 2043 05/24/20 2355 05/25/20 0414 05/25/20 0756  GLUCAP 176* 156* 149* 122* 110*   D-Dimer No results for input(s): DDIMER in the last 72 hours. Hgb A1c No results for input(s): HGBA1C in the last 72 hours. Lipid Profile No results for input(s): CHOL, HDL, LDLCALC, TRIG, CHOLHDL, LDLDIRECT in the last 72 hours. Thyroid function studies No results for input(s): TSH, T4TOTAL, T3FREE, THYROIDAB in the last 72 hours.  Invalid input(s): FREET3 Anemia work up No results for input(s): VITAMINB12, FOLATE, FERRITIN, TIBC, IRON, RETICCTPCT in the last 72 hours. Urinalysis    Component Value Date/Time   COLORURINE YELLOW 01/23/2020 1819   APPEARANCEUR  CLEAR 01/23/2020 1819   LABSPEC 1.012 01/23/2020 1819   PHURINE 5.0 01/23/2020 1819   GLUCOSEU NEGATIVE 01/23/2020 1819   HGBUR NEGATIVE 01/23/2020 1819   BILIRUBINUR NEGATIVE 01/23/2020 1819   KETONESUR NEGATIVE 01/23/2020 1819   PROTEINUR NEGATIVE 01/23/2020 1819   UROBILINOGEN 0.2 03/15/2013 1504   NITRITE NEGATIVE 01/23/2020 1819   LEUKOCYTESUR NEGATIVE 01/23/2020 1819   Sepsis Labs Invalid input(s): PROCALCITONIN,  WBC,  LACTICIDVEN Microbiology Recent Results (from the past 240 hour(s))  Culture, blood  (routine x 2)     Status: None   Collection Time: 05/15/20 10:26 AM   Specimen: BLOOD RIGHT HAND  Result Value Ref Range Status   Specimen Description BLOOD RIGHT HAND  Final   Special Requests   Final    BOTTLES DRAWN AEROBIC AND ANAEROBIC Blood Culture results may not be optimal due to an inadequate volume of blood received in culture bottles   Culture   Final    NO GROWTH 5 DAYS Performed at [facility], [address], [place], [place] [address]    Report Status 05/20/2020 FINAL  Final  Culture, blood (routine x 2)     Status: None   Collection Time: 05/15/20 10:31 AM   Specimen: BLOOD LEFT HAND  Result Value Ref Range Status   Specimen Description BLOOD LEFT HAND  Final   Special Requests   Final    BOTTLES DRAWN AEROBIC AND ANAEROBIC Blood Culture adequate volume   Culture   Final    NO GROWTH 5 DAYS Performed at [facility], [address] [place], [place] [address]    Report Status 05/20/2020 FINAL  Final  Aerobic/Anaerobic Culture (surgical/deep wound)     Status: None   Collection Time: 05/16/20 10:09 AM   Specimen: PATH Cytology Peritoneal fluid; Body Fluid  Result Value Ref Range Status   Specimen Description   Final    PERITONEAL Performed at [facility], [address] [place], [place] [address]    Special Requests   Final    NONE Performed at [place] [place], [place] [address]    Gram Stain   Final    FEW WBC PRESENT,BOTH PMN AND MONONUCLEAR RARE GRAM VARIABLE ROD Performed at [facility], [place] [address], [place], [place] [address]    Culture   Final    ABUNDANT BACTEROIDES FRAGILIS BETA LACTAMASE POSITIVE RARE ENTEROCOCCUS FAECALIS    Report Status 05/23/2020 FINAL  Final   Organism ID, Bacteria ENTEROCOCCUS FAECALIS  Final      Susceptibility   Enterococcus faecalis - MIC*    AMPICILLIN <=2 SENSITIVE Sensitive     VANCOMYCIN 2 SENSITIVE Sensitive     GENTAMICIN SYNERGY SENSITIVE Sensitive     * RARE ENTEROCOCCUS  FAECALIS     Time coordinating discharge: 45 minutes The  controlled substances registry was reviewed for this patient      SIGNED:   Edwin Dada, MD  [facility] 05/25/2020, 10:19 AM

## 2020-05-25 NOTE — Progress Notes (Signed)
Report given to [facility] nurse pt being transported pts husband has pt belongings
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
## 2020-05-25 NOTE — Progress Notes (Signed)
Patient arrived on unit, oriented to unit via carelink. Reviewed medications, therapy schedule, rehab routine and plan of care. States an understanding of information reviewed. No complications noted at this time. Patient is AX4  [employer]
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
## 2020-05-25 NOTE — Progress Notes (Signed)
PICC removed , no bleeding, gauze with vasoline and clear dressing applied.  EMS made aware of PICC sight and recent removal

## 2020-05-26 ENCOUNTER — Inpatient Hospital Stay (HOSPITAL_COMMUNITY): Payer: Medicare Other

## 2020-05-26 ENCOUNTER — Inpatient Hospital Stay (HOSPITAL_COMMUNITY): Payer: Medicare Other | Admitting: Occupational Therapy

## 2020-05-26 DIAGNOSIS — E46 Unspecified protein-calorie malnutrition: Secondary | ICD-10-CM

## 2020-05-26 DIAGNOSIS — D62 Acute posthemorrhagic anemia: Secondary | ICD-10-CM

## 2020-05-26 DIAGNOSIS — E8809 Other disorders of plasma-protein metabolism, not elsewhere classified: Secondary | ICD-10-CM

## 2020-05-26 DIAGNOSIS — I5032 Chronic diastolic (congestive) heart failure: Secondary | ICD-10-CM

## 2020-05-26 DIAGNOSIS — E1165 Type 2 diabetes mellitus with hyperglycemia: Secondary | ICD-10-CM

## 2020-05-26 DIAGNOSIS — Z794 Long term (current) use of insulin: Secondary | ICD-10-CM

## 2020-05-26 LAB — CBC WITH DIFFERENTIAL/PLATELET
Abs Immature Granulocytes: 0.04 10*3/uL (ref 0.00–0.07)
Basophils Absolute: 0.1 10*3/uL (ref 0.0–0.1)
Basophils Relative: 1 %
Eosinophils Absolute: 0.4 10*3/uL (ref 0.0–0.5)
Eosinophils Relative: 4 %
HCT: 35.7 % — ABNORMAL LOW (ref 36.0–46.0)
Hemoglobin: 10.8 g/dL — ABNORMAL LOW (ref 12.0–15.0)
Immature Granulocytes: 0 %
Lymphocytes Relative: 17 %
Lymphs Abs: 1.7 10*3/uL (ref 0.7–4.0)
MCH: 25.7 pg — ABNORMAL LOW (ref 26.0–34.0)
MCHC: 30.3 g/dL (ref 30.0–36.0)
MCV: 84.8 fL (ref 80.0–100.0)
Monocytes Absolute: 0.9 10*3/uL (ref 0.1–1.0)
Monocytes Relative: 9 %
Neutro Abs: 6.9 10*3/uL (ref 1.7–7.7)
Neutrophils Relative %: 69 %
Platelets: 322 10*3/uL (ref 150–400)
RBC: 4.21 MIL/uL (ref 3.87–5.11)
RDW: 17.3 % — ABNORMAL HIGH (ref 11.5–15.5)
WBC: 9.9 10*3/uL (ref 4.0–10.5)
nRBC: 0 % (ref 0.0–0.2)

## 2020-05-26 LAB — URINE CULTURE: Culture: <10000 — AB

## 2020-05-26 LAB — COMPREHENSIVE METABOLIC PANEL
ALT: 19 U/L (ref 0–44)
AST: 25 U/L (ref 15–41)
Albumin: 2.7 g/dL — ABNORMAL LOW (ref 3.5–5.0)
Alkaline Phosphatase: 63 U/L (ref 38–126)
Anion gap: 8 (ref 5–15)
BUN: 6 mg/dL — ABNORMAL LOW (ref 8–23)
CO2: 33 mmol/L — ABNORMAL HIGH (ref 22–32)
Calcium: 8.8 mg/dL — ABNORMAL LOW (ref 8.9–10.3)
Chloride: 101 mmol/L (ref 98–111)
Creatinine, Ser: 0.72 mg/dL (ref 0.44–1.00)
GFR, Estimated: >60 mL/min (ref >60)
Glucose, Bld: 112 mg/dL — ABNORMAL HIGH (ref 70–99)
Potassium: 3.6 mmol/L (ref 3.5–5.1)
Sodium: 142 mmol/L (ref 135–145)
Total Bilirubin: 0.5 mg/dL (ref 0.3–1.2)
Total Protein: 5.9 g/dL — ABNORMAL LOW (ref 6.5–8.1)

## 2020-05-26 LAB — GLUCOSE, CAPILLARY
Glucose-Capillary: 104 mg/dL — ABNORMAL HIGH (ref 70–99)
Glucose-Capillary: 117 mg/dL — ABNORMAL HIGH (ref 70–99)
Glucose-Capillary: 217 mg/dL — ABNORMAL HIGH (ref 70–99)
Glucose-Capillary: 161 mg/dL — ABNORMAL HIGH (ref 70–99)
Glucose-Capillary: 162 mg/dL — ABNORMAL HIGH (ref 70–99)

## 2020-05-26 LAB — URINALYSIS, ROUTINE W REFLEX MICROSCOPIC
Bilirubin Urine: NEGATIVE
Glucose, UA: NEGATIVE mg/dL
Hgb urine dipstick: NEGATIVE
Ketones, ur: NEGATIVE mg/dL
Leukocytes,Ua: NEGATIVE
Nitrite: NEGATIVE
Protein, ur: NEGATIVE mg/dL
Specific Gravity, Urine: 1.006 (ref 1.005–1.030)
pH: 8 (ref 5.0–8.0)

## 2020-05-26 MED ORDER — PROSOURCE PLUS PO LIQD
30.0000 mL | Freq: Two times a day (BID) | ORAL | Status: DC
  Administered 2020-05-27 – 2020-06-01 (×11): 30 mL via ORAL
  Filled 2020-05-26 (×11): qty 30

## 2020-05-26 MED ORDER — LEVOTHYROXINE SODIUM 75 MCG PO TABS
175.0000 ug | ORAL_TABLET | Freq: Every day | ORAL | Status: DC
  Administered 2020-05-27 – 2020-06-02 (×7): 175 ug via ORAL
  Filled 2020-05-26 (×7): qty 1

## 2020-05-26 NOTE — Progress Notes (Signed)
Occupational Therapy Session Note  Patient Details  Name: Ann [name] MRN: [id number] Date of Birth: 10-Dec-1945  Today's Date: 05/26/2020 OT Individual Time: 1400-1500 OT Individual Time Calculation (min): 60 min    Short Term Goals: Week 1:  OT Short Term Goal 1 (Week 1): STG = LTGs due to estimated LOS  Skilled Therapeutic Interventions/Progress Updates:  Treatment session with focus on functional transfers and endurance.  Pt received semi-reclined in bed reporting pain in abdomen due to recent changing of wound vac but ultimately agreeable to therapy session with encouragement. Pt completed bed mobility CGA and completed sit > stand from EOB with CGA with RW.  Pt completed stand pivot transfer bed > w/c with RW with CGA while therapist managed wound vac and O2 tubing.  Pt completed simulated car transfer to 19.5" height to simulate sedan height of car.  Pt completed short distance ambulatory transfer with RW with CGA and therapist managing tubing.  Pt able to advance BLE in and out of car with supervision.  Pt reports pain in abdomen with sit <> stand.  Pt returned to room and completed ambulatory transfer back to bed with RW with CGA while therapist managed tubing.  Pt completed bed mobility with CGA and remained semi-reclined in bed with all needs in reach.  Therapy Documentation Precautions:  Precautions Precautions: Fall Precaution Comments: dependent on supplemental O2; wound vac Restrictions Weight Bearing Restrictions: No General:   Vital Signs: Therapy Vitals Temp: 98.9 F (37.2 C) Temp Source: Oral Pulse Rate: 94 Resp: 18 BP: 114/78 Patient Position (if appropriate): Lying Oxygen Therapy SpO2: 94 % O2 Device: Nasal Cannula Pain:  Pt with c/o pain in abdomen due to recent changing of wound vac, not rated.   Therapy/Group: Individual Therapy  Rosalio Loud 05/26/2020, 3:55 PM

## 2020-05-26 NOTE — Progress Notes (Signed)
Inpatient Rehabilitation Center Individual Statement of Services  Patient Name:  Ann [name]  Date:  05/26/2020  Welcome to the Inpatient Rehabilitation Center.  Our goal is to provide you with an individualized program based on your diagnosis and situation, designed to meet your specific needs.  With this comprehensive rehabilitation program, you will be expected to participate in at least 3 hours of rehabilitation therapies Monday-Friday, with modified therapy programming on the weekends.  Your rehabilitation program will include the following services:  Physical Therapy (PT), Occupational Therapy (OT), 24 hour per day rehabilitation nursing, Care Coordinator, Rehabilitation Medicine, Nutrition Services and Pharmacy Services  Weekly team conferences will be held on Wednesday to discuss your progress.  Your Inpatient Rehabilitation Care Coordinator will talk with you frequently to get your input and to update you on team discussions.  Team conferences with you and your family in attendance may also be held.  Expected length of stay: 7-10 Days  Overall anticipated outcome: supervision-min with bathing and dressing  Depending on your progress and recovery, your program may change. Your Inpatient Rehabilitation Care Coordinator will coordinate services and will keep you informed of any changes. Your Inpatient Rehabilitation Care Coordinator's name and contact numbers are listed  below.  The following services may also be recommended but are not provided by the Inpatient Rehabilitation Center:    Home Health Rehabiltiation Services  Outpatient Rehabilitation Services    Arrangements will be made to provide these services after discharge if needed.  Arrangements include referral to agencies that provide these services.  Your insurance has been verified to be:  Medicare & [employer] Your primary doctor is:  Isac Sarna  Pertinent information will be shared with your doctor and your  insurance company.  Inpatient Rehabilitation Care Coordinator:  Dossie Der, [place] [phone] or [phone]  Information discussed with and copy given to patient by: Lucy Chris, 05/26/2020, 1:16 PM
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
## 2020-05-26 NOTE — Progress Notes (Signed)
Patient information reviewed and entered into [facility] by Becky Esma Kilts, PPS coordinator. Information including medical coding, function ability, and quality indicators will be reviewed and updated through discharge.   
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
## 2020-05-26 NOTE — Progress Notes (Signed)
[place] PHYSICAL MEDICINE & REHABILITATION PROGRESS NOTE  Subjective/Complaints: Patient seen laying in bed this morning. She states she slept fairly well overnight. She is ready begin therapies.  ROS: Denies CP, SOB, N/V/D  Objective: Vital Signs: Blood pressure 117/64, pulse 91, temperature 98.4 F (36.9 C), temperature source Oral, resp. rate 16, height 5\' 3"  (1.6 m), weight 126.5 kg, SpO2 96 %. No results found. Recent Labs    05/25/20 0543 05/26/20 0428  WBC 9.9 9.9  HGB 9.2* 10.8*  HCT 32.4* 35.7*  PLT 267 322   Recent Labs    05/25/20 0543 05/26/20 0428  NA 142 142  K 3.8 3.6  CL 101 101  CO2 32 33*  GLUCOSE 118* 112*  BUN 9 6*  CREATININE 0.58 0.72  CALCIUM 8.5* 8.8*    Intake/Output Summary (Last 24 hours) at 05/26/2020 1128 Last data filed at 05/26/2020 0943 Gross per 24 hour  Intake 480 ml  Output 2250 ml  Net -1770 ml        Physical Exam: BP 117/64 (BP Location: Right Arm)   Pulse 91   Temp 98.4 F (36.9 C) (Oral)   Resp 16   Ht 5\' 3"  (1.6 m)   Wt 126.5 kg   SpO2 96%   BMI 49.40 kg/m  Constitutional: No distress . Vital signs reviewed. HENT: Normocephalic.  Atraumatic. Eyes: EOMI. No discharge. Cardiovascular: No JVD. Irregularly irregular Respiratory: Normal effort.  No stridor.  Bilateral clear to auscultation. GI: Non-distended.  BS +. + Colostomy. + Tenderness. Skin: Warm and dry. Wound VAC to abdomen Psych: Normal mood.  Normal behavior. Musc: No edema in extremities.  No tenderness in extremities. Neuro: Alert Motor: Bilateral upper extremities: 5/5 proximal distal Bilateral lower extremities: 4-4+/5 proximal to distal  Assessment/Plan: 1. Functional deficits which require 3+ hours per day of interdisciplinary therapy in a comprehensive inpatient rehab setting.  Physiatrist is providing close team supervision and 24 hour management of active medical problems listed below.  Physiatrist and rehab team continue to assess  barriers to discharge/monitor patient progress toward functional and medical goals   Care Tool:  Bathing              Bathing assist       Upper Body Dressing/Undressing Upper body dressing        Upper body assist      Lower Body Dressing/Undressing Lower body dressing            Lower body assist       Toileting Toileting    Toileting assist       Transfers Chair/bed transfer  Transfers assist     Chair/bed transfer assist level: Minimal Assistance - Patient > 75%     Locomotion Ambulation   Ambulation assist      Assist level: 2 helpers Assistive device: Walker-rolling Max distance: 83ft   Walk 10 feet activity   Assist     Assist level: 2 helpers Assistive device: Walker-rolling   Walk 50 feet activity   Assist Walk 50 feet with 2 turns activity did not occur: Safety/medical concerns (fatigue, generalized weakness, decresaed balance)         Walk 150 feet activity   Assist Walk 150 feet activity did not occur: Safety/medical concerns (fatigue, generalized weakness, decresaed balance)         Walk 10 feet on uneven surface  activity   Assist Walk 10 feet on uneven surfaces activity did not occur: Safety/medical concerns (fatigue, generalized weakness,  decresaed balance)         Wheelchair     Assist Will patient use wheelchair at discharge?: Yes Type of Wheelchair: Manual    Wheelchair assist level: Supervision/Verbal cueing Max wheelchair distance: 168ft    Wheelchair 50 feet with 2 turns activity    Assist        Assist Level: Supervision/Verbal cueing   Wheelchair 150 feet activity     Assist      Assist Level: Supervision/Verbal cueing    Medical Problem List and Plan: 1.  Debility secondary to sepsis due to sigmoid diverticulitis with contained perforation.  Status post colectomy colostomy placement 05/16/2020 per Dr. Larae Grooms.WOC follow-up education ostomy and  maintenance of wound VAC  Begin CIR evaluations  2.  Antithrombotics: -DVT/anticoagulation: Eliquis             -antiplatelet therapy: N/A 3. Pain Management: Neurontin 400 mg 3 times daily oxycodone as needed  Moderate increased activity 4. Mood: Zoloft 50 mg daily             -antipsychotic agents: N/A 5. Neuropsych: This patient is capable of making decisions on her own behalf. 6. Skin/Wound Care: Routine skin checks wound VAC changes as directed with wound care nurse ostomy education  Will inquire regarding duration 7. Fluids/Electrolytes/Nutrition: Strict ins and outs 8.  Acute blood loss anemia.    Hemoglobin 10.8 on 12/31  Continue to monitor 9.  Diabetes mellitus with hyperglycemia.  Hemoglobin A1c 7.1.  Levemir 18 units daily.  Check blood sugars before meals and at bedtime  Moderate increased mobility 10.  Atrial fibrillation.  Cardizem 240 daily.  Continue Eliquis.  Cardiac rate controlled  Monitor heart rate with increased activity. 11.  Adrenal insufficiency.  Continue prednisone chronically 10 mg daily 12. Chronic diastolic congestive heart failure.  Monitor for any signs of fluid overload.Lasix 40 mg BID Filed Weights   05/25/20 1229 05/25/20 1406  Weight: 126.5 kg 126.5 kg   13.  COPD.  Continue inhalers as directed 14.  CAD with history of prior stenting.  No chest pain or increasing shortness of breath 15.  Morbid obesity.  BMI 49.68.  Dietary follow-up 16.  Hyperlipidemia.  Lipitor 17.  Hypothyroidism.  Synthroid 18.  Overactive bladder.  Enablex 7.5 mg daily,Myrbetriq 50 mg daily  Will use depends if needed 19. Hypoalbuminemia  Supplement initiated on 12/31   LOS: 1 days A FACE TO FACE EVALUATION WAS PERFORMED  Austine Kelsay Karis Juba 05/26/2020, 11:28 AM

## 2020-05-26 NOTE — Consult Note (Signed)
WOC Nurse ostomy follow up Patient receiving care in [facility] 4W01 Stoma type/location: Budded LUQ Stomal assessment/size: Moist pink purple 1 3/4" Peristomal assessment: Starting some mucocutaneous separation. Noted some bleeding when cleaning around the stoma Treatment options for stomal/peristomal skin: Cleaned the area with plain water. Let dry. Coated the area with no sting barrier film. May need to crust at next pouch change Output: Thin green/brown Ostomy pouching: 2pc. 2 3/4" [place] # [phone]) Skin barrier [place] # 2) Skin barrier rings [place] # [phone]) Education provided: Spouse was present for pouch change. States he has had a colostomy years ago and knows about them. He want to observe to refresh his memory on how to do the change. He was not familiar with the barrier ring.  Patient was eager to do what she could. States she has a problem seeing everything that needs to be done and will require assistance from her husband or one of her sons. She helped with removal of the pouch. Says that she already knows how to empty and close the pouch. She assisted her husband when he had a colostomy. Instructed both patient and husband to clean with water only. No soaps or baby wipes. Told husband to let the sons know that we would be there on Tuesdays and Fridays if they want to learn to be of assistance to the patient.  Additional ostomy supplies ordered to be placed at the bedside.   Enrolled patient in [place] Secure Start Discharge program: Yes Previously  Renaldo Reel. Katrinka Blazing, MSN, RN, CMSRN, Angus Seller, [facility] Wound Treatment Associate Pager [phone]
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
## 2020-05-27 DIAGNOSIS — T380X5A Adverse effect of glucocorticoids and synthetic analogues, initial encounter: Secondary | ICD-10-CM

## 2020-05-27 DIAGNOSIS — E1165 Type 2 diabetes mellitus with hyperglycemia: Secondary | ICD-10-CM

## 2020-05-27 DIAGNOSIS — Z9981 Dependence on supplemental oxygen: Secondary | ICD-10-CM

## 2020-05-27 DIAGNOSIS — I4891 Unspecified atrial fibrillation: Secondary | ICD-10-CM

## 2020-05-27 DIAGNOSIS — R739 Hyperglycemia, unspecified: Secondary | ICD-10-CM

## 2020-05-27 DIAGNOSIS — Z794 Long term (current) use of insulin: Secondary | ICD-10-CM

## 2020-05-27 LAB — GLUCOSE, CAPILLARY
Glucose-Capillary: 111 mg/dL — ABNORMAL HIGH (ref 70–99)
Glucose-Capillary: 126 mg/dL — ABNORMAL HIGH (ref 70–99)
Glucose-Capillary: 134 mg/dL — ABNORMAL HIGH (ref 70–99)
Glucose-Capillary: 172 mg/dL — ABNORMAL HIGH (ref 70–99)
Glucose-Capillary: 173 mg/dL — ABNORMAL HIGH (ref 70–99)
Glucose-Capillary: 206 mg/dL — ABNORMAL HIGH (ref 70–99)

## 2020-05-27 NOTE — Progress Notes (Signed)
[facility] PHYSICAL MEDICINE & REHABILITATION PROGRESS NOTE  Subjective/Complaints: Patient seen lying in bed this morning.  She states she slept well overnight.  States she had a good first day of therapies yesterday. VAC suctioning without leaks.  ROS: Denies CP, SOB, N/V/D  Objective: Vital Signs: Blood pressure 118/68, pulse 68, temperature 98.4 F (36.9 C), temperature source Oral, resp. rate 20, height 5\' 3"  (1.6 m), weight 126.5 kg, SpO2 97 %. No results found. Recent Labs    05/25/20 0543 05/26/20 0428  WBC 9.9 9.9  HGB 9.2* 10.8*  HCT 32.4* 35.7*  PLT 267 322   Recent Labs    05/25/20 0543 05/26/20 0428  NA 142 142  K 3.8 3.6  CL 101 101  CO2 32 33*  GLUCOSE 118* 112*  BUN 9 6*  CREATININE 0.58 0.72  CALCIUM 8.5* 8.8*    Intake/Output Summary (Last 24 hours) at 05/27/2020 1246 Last data filed at 05/27/2020 1246 Gross per 24 hour  Intake 938 ml  Output 1100 ml  Net -162 ml        Physical Exam: BP 118/68 (BP Location: Right Arm)   Pulse 68   Temp 98.4 F (36.9 C) (Oral)   Resp 20   Ht 5\' 3"  (1.6 m)   Wt 126.5 kg   SpO2 97%   BMI 49.40 kg/m  Constitutional: No distress . Vital signs reviewed. HENT: Normocephalic.  Atraumatic. Eyes: EOMI. No discharge. Cardiovascular: No JVD.  Irregularly irregular. Respiratory: Normal effort.  No stridor.  Bilateral clear to auscultation.  + [place]. GI: Non-distended.  BS +.  Colostomy. Skin: Warm and dry.  Wound VAC. Psych: Normal mood.  Normal behavior. Musc: No edema in extremities.  No tenderness in extremities. Neuro: Alert Motor: Bilateral upper extremities: 5/5 proximal distal Bilateral lower extremities: 4-4+/5 proximal to distal, stable  Assessment/Plan: 1. Functional deficits which require 3+ hours per day of interdisciplinary therapy in a comprehensive inpatient rehab setting.  Physiatrist is providing close team supervision and 24 hour management of active medical problems listed  below.  Physiatrist and rehab team continue to assess barriers to discharge/monitor patient progress toward functional and medical goals   Care Tool:  Bathing    Body parts bathed by patient: Right arm,Left arm,Chest,Abdomen,Right upper leg,Left upper leg,Face   Body parts bathed by helper: Front perineal area,Buttocks,Right lower leg,Left lower leg     Bathing assist Assist Level: Moderate Assistance - Patient 50 - 74%     Upper Body Dressing/Undressing Upper body dressing   What is the patient wearing?: Dress    Upper body assist Assist Level: Set up assist    Lower Body Dressing/Undressing Lower body dressing      What is the patient wearing?: Incontinence brief     Lower body assist Assist for lower body dressing: Total Assistance - Patient < 25%     Toileting Toileting    Toileting assist Assist for toileting: Total Assistance - Patient < 25%     Transfers Chair/bed transfer  Transfers assist     Chair/bed transfer assist level: Minimal Assistance - Patient > 75%     Locomotion Ambulation   Ambulation assist      Assist level: 2 helpers Assistive device: Walker-rolling Max distance: 35ft   Walk 10 feet activity   Assist     Assist level: 2 helpers Assistive device: Walker-rolling   Walk 50 feet activity   Assist Walk 50 feet with 2 turns activity did not occur: Safety/medical concerns (fatigue,  generalized weakness, decresaed balance)         Walk 150 feet activity   Assist Walk 150 feet activity did not occur: Safety/medical concerns (fatigue, generalized weakness, decresaed balance)         Walk 10 feet on uneven surface  activity   Assist Walk 10 feet on uneven surfaces activity did not occur: Safety/medical concerns (fatigue, generalized weakness, decresaed balance)         Wheelchair     Assist Will patient use wheelchair at discharge?: Yes Type of Wheelchair: Manual    Wheelchair assist level:  Supervision/Verbal cueing Max wheelchair distance: 130ft    Wheelchair 50 feet with 2 turns activity    Assist        Assist Level: Supervision/Verbal cueing   Wheelchair 150 feet activity     Assist      Assist Level: Supervision/Verbal cueing    Medical Problem List and Plan: 1.  Debility secondary to sepsis due to sigmoid diverticulitis with contained perforation.  Status post colectomy colostomy placement 05/16/2020 per Dr. Larae Grooms.WOC follow-up education ostomy and maintenance of wound VAC  Continue CIR  2.  Antithrombotics: -DVT/anticoagulation: Eliquis             -antiplatelet therapy: N/A 3. Pain Management: Neurontin 400 mg 3 times daily oxycodone as needed  Controlled on 1/1  Moderate increased activity 4. Mood: Zoloft 50 mg daily             -antipsychotic agents: N/A 5. Neuropsych: This patient is capable of making decisions on her own behalf. 6. Skin/Wound Care: Routine skin checks wound VAC changes as directed with wound care nurse ostomy education  Will follow up regarding duration 7. Fluids/Electrolytes/Nutrition: Strict ins and outs 8.  Acute blood loss anemia.    Hemoglobin 10.8 on 12/31  Continue to monitor 9. Steroid-induced hyperglycemia on diabetes mellitus with hyperglycemia.  Hemoglobin A1c 7.1.  Levemir 18 units daily.  Check blood sugars before meals and at bedtime  Slightly labile on 1/1  Moderate increased mobility 10.  Atrial fibrillation.  Cardizem 240 daily.  Continue Eliquis.  Cardiac rate controlled  Rate controlled on 1/1  Monitor heart rate with increased activity. 11.  Adrenal insufficiency.  Continue prednisone chronically 10 mg daily 12. Chronic diastolic congestive heart failure.  Monitor for any signs of fluid overload.Lasix 40 mg BID Filed Weights   05/25/20 1229 05/25/20 1406  Weight: 126.5 kg 126.5 kg   13.  COPD.  Continue inhalers as directed  Continue supplemental oxygen, 2 L at baseline 14.  CAD with  history of prior stenting.  No chest pain or increasing shortness of breath 15.  Morbid obesity.  BMI 49.68.  Dietary follow-up 16.  Hyperlipidemia.  Lipitor 17.  Hypothyroidism.  Synthroid 18.  Overactive bladder.  Enablex 7.5 mg daily,Myrbetriq 50 mg daily  Will use depends if needed 19. Hypoalbuminemia  Supplement initiated on 12/31   LOS: 2 days A FACE TO FACE EVALUATION WAS PERFORMED  Ann [name] 05/27/2020, 12:46 PM

## 2020-05-28 ENCOUNTER — Inpatient Hospital Stay (HOSPITAL_COMMUNITY): Payer: Medicare Other | Admitting: Occupational Therapy

## 2020-05-28 ENCOUNTER — Inpatient Hospital Stay (HOSPITAL_COMMUNITY): Payer: Medicare Other | Admitting: Physical Therapy

## 2020-05-28 DIAGNOSIS — R7309 Other abnormal glucose: Secondary | ICD-10-CM

## 2020-05-28 LAB — GLUCOSE, CAPILLARY
Glucose-Capillary: 122 mg/dL — ABNORMAL HIGH (ref 70–99)
Glucose-Capillary: 127 mg/dL — ABNORMAL HIGH (ref 70–99)
Glucose-Capillary: 127 mg/dL — ABNORMAL HIGH (ref 70–99)
Glucose-Capillary: 130 mg/dL — ABNORMAL HIGH (ref 70–99)
Glucose-Capillary: 149 mg/dL — ABNORMAL HIGH (ref 70–99)
Glucose-Capillary: 233 mg/dL — ABNORMAL HIGH (ref 70–99)

## 2020-05-28 MED ORDER — INSULIN ASPART 100 UNIT/ML ~~LOC~~ SOLN
0.0000 [IU] | Freq: Three times a day (TID) | SUBCUTANEOUS | Status: DC
  Administered 2020-05-28 – 2020-05-29 (×2): 1 [IU] via SUBCUTANEOUS
  Administered 2020-05-29 (×2): 2 [IU] via SUBCUTANEOUS
  Administered 2020-05-30 (×2): 1 [IU] via SUBCUTANEOUS
  Administered 2020-05-31: 2 [IU] via SUBCUTANEOUS
  Administered 2020-05-31: 1 [IU] via SUBCUTANEOUS
  Administered 2020-06-01: 2 [IU] via SUBCUTANEOUS
  Administered 2020-06-01 – 2020-06-02 (×2): 1 [IU] via SUBCUTANEOUS

## 2020-05-28 MED ORDER — CARBAMIDE PEROXIDE 6.5 % OT SOLN
5.0000 [drp] | Freq: Two times a day (BID) | OTIC | Status: DC
  Administered 2020-05-28 – 2020-06-02 (×10): 5 [drp] via OTIC
  Filled 2020-05-28 (×2): qty 15

## 2020-05-28 NOTE — Progress Notes (Signed)
Occupational Therapy Session Note  Patient Details  Name: Ann [name] MRN: [id number] Date of Birth: 12/05/1945  Today's Date: 05/28/2020 OT Individual Time: 2952-8413 and 1430-1530 OT Individual Time Calculation (min): 57 min and 60 min 15 minutes missed due to nursing care  Short Term Goals: Week 1:  OT Short Term Goal 1 (Week 1): STG = LTGs due to estimated LOS  Skilled Therapeutic Interventions/Progress Updates:    Pt greeted in bed and premedicated for pain, stated staff had just removed her from purewick and that she had been incontinent of urine this AM. Staff had already assisted with pericare. Pt wanted to sit EOB to wash remainder of body areas and don a clean dress. 02 sats at start of session on 2L 97%. Per pt, she is on 2L at baseline PTA. Pt transitioned to EOB unassisted using hospital bed features. Assistance for applying antifungal powder under skin folds after she washed UB with supervision assist. Min cuing for 02 tube mgt. She needed assistance to wash feet and don footwear. Supervision/cues for 02 mgt when donning overhead dress after. Setup for grooming tasks. 02 sats monitored during activity today with sats reading 94% and above, encouraged mindful breathing whenever bending forward for safety. CGA for stand pivot<recliner using RW. Pt with skin redness on her back, encouraged sidelying in bed and sitting up in the day for skin integrity. Pt remained sitting up in the recliner at close of session, left with all needs within reach, sitting on chair alarm, satting at 97%.   2nd Session 1:1 tx (60 min) Pt greeted in the w/c, in care of RN and NT to assess vitals and provide pain medicine. Time missed initially due to nursing care with pt reporting some abdominal pain. Afterwards pt was agreeable to tx. Started with AE training with pt able to doff gripper socks using the reacher and then don gripper socks using the sock aide when given instruction and increased time. BSC  transfer completed using RW with CGA, pt needing cues and assist for mgt of 02 tube and wound vac. Assistance needed for perihygiene and clothing mgt due to body habitus. Pt then completed a short distance ambulatory transfer to the bed using RW with CGA. She returned to supine and was left in care of RN. 02 sats throughout session 96% and above.   Note that pt was also provided with elevating leg rests for w/c to help with edema control Therapy Documentation Precautions:  Precautions Precautions: Fall Precaution Comments: dependent on supplemental O2; wound vac Restrictions Weight Bearing Restrictions: No ADL: ADL Grooming: Setup Where Assessed-Grooming: Sitting at sink Upper Body Bathing: Supervision/safety,Setup Where Assessed-Upper Body Bathing: Wheelchair Lower Body Bathing: Moderate assistance Where Assessed-Lower Body Bathing: Wheelchair Upper Body Dressing: Setup Where Assessed-Upper Body Dressing: Wheelchair Lower Body Dressing: Maximal assistance Where Assessed-Lower Body Dressing: Wheelchair Toileting: Maximal assistance Where Assessed-Toileting: Bedside Commode Toilet Transfer: Minimal assistance Toilet Transfer Method: Stand pivot Acupuncturist: Extra wide bedside commode      Therapy/Group: Individual Therapy  Elye Harmsen A Shaquetta Arcos 05/28/2020, 12:27 PM

## 2020-05-28 NOTE — Progress Notes (Signed)
Can patient be changed to ACHS?

## 2020-05-28 NOTE — Progress Notes (Signed)
Pt complains of increased lower abdominal pain RLQ and LLQ. Requested OXY while tylenol has usually been effective. Vitals obtained. Mylo Red

## 2020-05-28 NOTE — Progress Notes (Signed)
Physical Therapy Session Note  Patient Details  Name: LACOLE KOMOROWSKI MRN: [id number] Date of Birth: 1945/09/24  Today's Date: 05/28/2020 PT Individual Time: 1305-1405 PT Individual Time Calculation (min): 60 min   Short Term Goals: Week 1:  PT Short Term Goal 1 (Week 1): STG=LTG due to LOS  Skilled Therapeutic Interventions/Progress Updates: Pt presented in recliner agreeable to therapy. Pt states current pain 3/10 increased during session with PTA notifying nsg during session. SpO2 remained >88% on 2L supplemental O2 throughout session.  Performed ambulatory transfer to w/c with RW and CGA overall. Pt transported to rehab gym for energy conservation. Performed ambulatory transfer to mat in same manner as prior. Participated in seated and standing activities for endurance. Participated in alternating toe taps to 4in step x 10 with pt requesting to sit after activity HR 125 but resolved to 90s within 2 min. Participated in placing clothespins on basketball net and removing x 2 however required seated rest between placing them on net and taking them off with max HR low 120s with activity. Performed Zoom ball in sitting for 30 sec and 1 min with no significant increase in HR. Pt ambulated for endurance 69f x 1 CGA with PTA assisting with O2 tank with noted dyspnea which resolved with rest. Pt transferred back to w/c CGA and transferred back to room. Pt requesting to remain in w/c as pt scheduled for next session in 15 min. Pt left seated in w/c with call bell within reach and needs met.      Therapy Documentation Precautions:  Precautions Precautions: Fall Precaution Comments: dependent on supplemental O2; wound vac Restrictions Weight Bearing Restrictions: No General:   Vital Signs: Therapy Vitals Temp: 98.4 F (36.9 C) Temp Source: Axillary Pulse Rate: 84 Resp: 18 BP: (!) 85/47 Patient Position (if appropriate): Sitting Oxygen Therapy SpO2: 95 % O2 Device: Nasal Cannula O2 Flow  Rate (L/min): 2 L/min Pain: Pain Assessment Pain Scale: 0-10 Pain Score: 6  Pain Location: Abdomen Pain Orientation: Lower Pain Intervention(s): Medication (See eMAR) Mobility:   Locomotion :    Trunk/Postural Assessment :    Balance:   Exercises:   Other Treatments:      Therapy/Group: Individual Therapy  Lamarcus Spira 05/28/2020, 3:27 PM

## 2020-05-28 NOTE — Progress Notes (Signed)
Ann [name] PROGRESS NOTE  Subjective/Complaints: Patient seen laying in bed this morning.  She states she slept well overnight.  She is in good spirits.  She denies complaints.  ROS: Denies CP, SOB, N/V/D  Objective: Vital Signs: Blood pressure 138/60, pulse 83, temperature 97.7 F (36.5 C), temperature source Oral, resp. rate 17, height 5\' 3"  (1.6 m), weight 126.5 kg, SpO2 98 %. No results found. Recent Labs    05/26/20 0428  WBC 9.9  HGB 10.8*  HCT 35.7*  PLT 322   Recent Labs    05/26/20 0428  NA 142  K 3.6  CL 101  CO2 33*  GLUCOSE 112*  BUN 6*  CREATININE 0.72  CALCIUM 8.8*    Intake/Output Summary (Last 24 hours) at 05/28/2020 1218 Last data filed at 05/28/2020 [id number] Gross per 24 hour  Intake 762 ml  Output 3525 ml  Net -2763 ml        Physical Exam: BP 138/60 (BP Location: Left Arm)   Pulse 83   Temp 97.7 F (36.5 C) (Oral)   Resp 17   Ht 5\' 3"  (1.6 m)   Wt 126.5 kg   SpO2 98%   BMI 49.40 kg/m   Constitutional: No distress . Vital signs reviewed. HENT: Normocephalic.  Atraumatic. Eyes: EOMI. No discharge. Cardiovascular: No JVD.  Irregularly irregular.  Respiratory: Normal effort.  No stridor.  Bilateral clear to auscultation. +. GI: Non-distended.  BS +. + Colostomy.  Skin: Warm and dry.  Wound VAC. Psych: Normal mood.  Normal behavior. Musc: No edema in extremities.  No tenderness in extremities. Neuro: Alert Motor: Bilateral upper extremities: 5/5 proximal distal Bilateral lower extremities: 4-4+/5 proximal to distal, unchanged  Assessment/Plan: 1. Functional deficits which require 3+ hours per day of interdisciplinary therapy in a comprehensive inpatient rehab setting.  Physiatrist is providing close team supervision and 24 hour management of active medical problems listed below.  Physiatrist and rehab team continue to assess barriers to discharge/monitor patient progress toward functional and medical  goals   Care Tool:  Bathing    Body parts bathed by patient: Right arm,Left arm,Chest,Abdomen,Right upper leg,Left upper leg,Face   Body parts bathed by helper: Front perineal area,Buttocks,Right lower leg,Left lower leg     Bathing assist Assist Level: Moderate Assistance - Patient 50 - 74%     Upper Body Dressing/Undressing Upper body dressing   What is the patient wearing?: Dress    Upper body assist Assist Level: Supervision/Verbal cueing    Lower Body Dressing/Undressing Lower body dressing      What is the patient wearing?: Incontinence brief     Lower body assist Assist for lower body dressing: Total Assistance - Patient < 25%     Toileting Toileting    Toileting assist Assist for toileting: Total Assistance - Patient < 25%     Transfers Chair/bed transfer  Transfers assist     Chair/bed transfer assist level: Minimal Assistance - Patient > 75%     Locomotion Ambulation   Ambulation assist      Assist level: 2 helpers Assistive device: Walker-rolling Max distance: 83ft   Walk 10 feet activity   Assist     Assist level: 2 helpers Assistive device: Walker-rolling   Walk 50 feet activity   Assist Walk 50 feet with 2 turns activity did not occur: Safety/medical concerns (fatigue, generalized weakness, decresaed balance)         Walk 150 feet activity   Assist Walk 150  feet activity did not occur: Safety/medical concerns (fatigue, generalized weakness, decresaed balance)         Walk 10 feet on uneven surface  activity   Assist Walk 10 feet on uneven surfaces activity did not occur: Safety/medical concerns (fatigue, generalized weakness, decresaed balance)         Wheelchair     Assist Will patient use wheelchair at discharge?: Yes Type of Wheelchair: Manual    Wheelchair assist level: Supervision/Verbal cueing Max wheelchair distance: 135ft    Wheelchair 50 feet with 2 turns activity    Assist         Assist Level: Supervision/Verbal cueing   Wheelchair 150 feet activity     Assist      Assist Level: Supervision/Verbal cueing    Medical Problem List and Plan: 1.  Debility secondary to sepsis due to sigmoid diverticulitis with contained perforation.  Status post colectomy colostomy placement 05/16/2020 per Dr. Larae [name].WOC follow-up education ostomy and maintenance of wound VAC  Continue CIR  2.  Antithrombotics: -DVT/anticoagulation: Eliquis             -antiplatelet therapy: N/A 3. Pain Management: Neurontin 400 mg 3 times daily oxycodone as needed  Controlled on 1/2  Moderate increased activity 4. Mood: Zoloft 50 mg daily             -antipsychotic agents: N/A 5. Neuropsych: This patient is capable of making decisions on her own behalf. 6. Skin/Wound Care: Routine skin checks wound VAC changes as directed with wound care nurse ostomy education  Will follow up regarding duration of VAC 7. Fluids/Electrolytes/Nutrition: Strict ins and outs 8.  Acute blood loss anemia.    Hemoglobin 10.8 on 12/31  Continue to monitor 9. Steroid-induced hyperglycemia on diabetes mellitus with hyperglycemia.  Hemoglobin A1c 7.1.  Levemir 18 units daily.  Check blood sugars before meals and at bedtime  CBGs changed to ACHS   Slightly labile on 1/2, monitor for trend with adjusted CBGs  Moderate increased mobility 10.  Atrial fibrillation.  Cardizem 240 daily.  Continue Eliquis.  Cardiac rate controlled  Rate relatively controlled on 1/2  Monitor heart rate with increased activity. 11.  Adrenal insufficiency.  Continue prednisone chronically 10 mg daily 12. Chronic diastolic congestive heart failure.  Monitor for any signs of fluid overload.Lasix 40 mg BID Filed Weights   05/25/20 1229 05/25/20 1406  Weight: 126.5 kg 126.5 kg   13.  COPD.  Continue inhalers as directed  Continue supplemental oxygen, 2 L at baseline 14.  CAD with history of prior stenting.  No chest pain or  increasing shortness of breath 15.  Morbid obesity.  BMI 49.68.  Dietary follow-up 16.  Hyperlipidemia.  Lipitor 17.  Hypothyroidism.  Synthroid 18.  Overactive bladder.  Enablex 7.5 mg daily,Myrbetriq 50 mg daily  Will use depends if needed 19. Hypoalbuminemia  Supplement initiated on 12/31   LOS: 3 days A FACE TO FACE EVALUATION WAS PERFORMED  Ann [name] Karis Juba 05/28/2020, 12:18 PM

## 2020-05-29 ENCOUNTER — Inpatient Hospital Stay (HOSPITAL_COMMUNITY): Payer: Medicare Other | Admitting: Occupational Therapy

## 2020-05-29 ENCOUNTER — Inpatient Hospital Stay (HOSPITAL_COMMUNITY): Payer: Medicare Other

## 2020-05-29 LAB — GLUCOSE, CAPILLARY
Glucose-Capillary: 137 mg/dL — ABNORMAL HIGH (ref 70–99)
Glucose-Capillary: 183 mg/dL — ABNORMAL HIGH (ref 70–99)
Glucose-Capillary: 184 mg/dL — ABNORMAL HIGH (ref 70–99)

## 2020-05-29 MED ORDER — ACETAMINOPHEN-CODEINE #3 300-30 MG PO TABS
1.0000 | ORAL_TABLET | ORAL | Status: DC | PRN
  Administered 2020-05-29 – 2020-06-02 (×7): 1 via ORAL
  Filled 2020-05-29 (×7): qty 1

## 2020-05-29 MED ORDER — OXYCODONE HCL 5 MG PO TABS
5.0000 mg | ORAL_TABLET | Freq: Three times a day (TID) | ORAL | Status: DC | PRN
  Administered 2020-06-01: 5 mg via ORAL
  Filled 2020-05-29: qty 1

## 2020-05-29 NOTE — Progress Notes (Signed)
Physical Therapy Session Note  Patient Details  Name: Ann [name] MRN: [id number] Date of Birth: 1945/12/28  Today's Date: 05/29/2020 PT Individual Time: 0800-0854 PT Individual Time Calculation (min): 54 min   Short Term Goals: Week 1:  PT Short Term Goal 1 (Week 1): STG=LTG due to LOS  Skilled Therapeutic Interventions/Progress Updates:   Received pt supine in bed, pt agreeable to therapy, and reported pain 7/10 in stomach (premedicated). Repositioning, rest breaks, and distraction done to reduce pain levels. Pt reported sleeping on and off last night and stated "I hope we're not going to do anything crazy this morning, I don't feel up to it." Session with emphasis on functional mobility/transfers, dressing, generalized strengthening, dynamic standing balance/coordination, and improved activity tolerance. Pt on 2L O2 via [place] with O2 sat 95% at rest. Pt transferred supine<>sitting EOB with HOB elevated and use of bedrails with supervision. Doffed dirty gown and donned clean pull over dress with CGA. Donned slip on shoes sitting EOB with set up assist. Pt transferred bed<>WC stand<>pivot with RW and CGA and sat in [facility] and brushed teeth, applied deodorant, combed hair, and washed face with set up assist and increased time. Therapist assisted pt in soaking dentures for time management purposes.Pt requested to stay in room and perform exercises. Pt transferred WC<>recliner stand<>pivot with RW and CGA. Pt performed the following exercises from recliner with supervision with verbal cues for technique: -seated alternating marches 2x12 bilaterally  -seated LAQ 2x16 bilaterally  -seated hip adduction pillow squeezes 2x12 -standing heel raises x10 Pt required rest breaks throughout session due to SOB and poor activity tolerance. O2 sat 96% and HR 90bpm with mobility. MD present for morning rounds to discuss stomach discomfort. Concluded session with pt sitting in recliner, needs within reach, and chair pad  alarm on.   Therapy Documentation Precautions:  Precautions Precautions: Fall Precaution Comments: dependent on supplemental O2; wound vac Restrictions Weight Bearing Restrictions: No   Therapy/Group: Individual Therapy Martin Majestic PT, DPT   05/29/2020, 7:17 AM

## 2020-05-29 NOTE — Consult Note (Signed)
WOC Nurse wound follow up Patient receiving care in [facility] 4W01 Wound type: Surgical wound Measurement: 15 cm x 5.5 cm x 5 cm Wound bed: 85% Red/pink granulation tissue 15% Brown fat tissue.  Drainage (amount, consistency, odor) serosanguinous drainage in canister. Max drainage 50 cc since 12/31 Periwound: Intact Dressing procedure/placement/frequency: NPWT discontinued today. Will start moist/dry dressing changes BID.   Monitor the wound area(s) for worsening of condition such as: Signs/symptoms of infection, increase in size, development of or worsening of odor, development of pain, or increased pain at the affected locations.   Notify the medical team if any of these develop.  Thank you for the consult. WOC nurse will follow Tues and Friday at this time for ostomy teaching.   Please re-consult the WOC team if needed.  Renaldo Reel Katrinka Blazing, MSN, RN, CMSRN, Angus Seller, [facility] Wound Treatment Associate Pager [phone]

## 2020-05-29 NOTE — Progress Notes (Signed)
PA Dan made aware of pt increased abdominal pain. No new orders. Mylo Red, LPN

## 2020-05-29 NOTE — Progress Notes (Addendum)
[facility] PHYSICAL MEDICINE & REHABILITATION PROGRESS NOTE  Subjective/Complaints: C/o increased abdominal pain. Pain feels dull, bilateral, lower quadrants, not new. Received prn Tylenol this morning- no Oxycodone (last used 1/2). Discussed adding tylenol with codeine for severe pain to limit oxycodone use and she is agreeable.   ROS: Denies CP, SOB, N/V/D  Objective: Vital Signs: Blood pressure 116/74, pulse 77, temperature 98.2 F (36.8 C), temperature source Oral, resp. rate 18, height 5\' 3"  (1.6 m), weight 126.5 kg, SpO2 97 %. No results found. No results for input(s): WBC, HGB, HCT, PLT in the last 72 hours. No results for input(s): NA, K, CL, CO2, GLUCOSE, BUN, CREATININE, CALCIUM in the last 72 hours.  Intake/Output Summary (Last 24 hours) at 05/29/2020 0834 Last data filed at 05/29/2020 0734 Gross per 24 hour  Intake 600 ml  Output 275 ml  Net 325 ml   Physical Exam: BP 116/74 (BP Location: Right Arm)   Pulse 77   Temp 98.2 F (36.8 C) (Oral)   Resp 18   Ht 5\' 3"  (1.6 m)   Wt 126.5 kg   SpO2 97%   BMI 49.40 kg/m   Gen: no distress, normal appearing HEENT: oral mucosa pink and moist, NCAT Cardio:  Irregularly irregular. Chest: normal effort, +[place]. Abd: soft, + Colostomy.  Ext: no edema Skin:Wound VAC. Neuro: Alert Motor: Bilateral upper extremities: 5/5 proximal distal Bilateral lower extremities: 4-4+/5 proximal to distal, unchanged  Assessment/Plan: 1. Functional deficits which require 3+ hours per day of interdisciplinary therapy in a comprehensive inpatient rehab setting.  Physiatrist is providing close team supervision and 24 hour management of active medical problems listed below.  Physiatrist and rehab team continue to assess barriers to discharge/monitor patient progress toward functional and medical goals   Care Tool:  Bathing    Body parts bathed by patient: Right arm,Left arm,Chest,Abdomen,Right upper leg,Left upper leg,Face   Body parts bathed  by helper: Front perineal area,Buttocks,Right lower leg,Left lower leg     Bathing assist Assist Level: Moderate Assistance - Patient 50 - 74%     Upper Body Dressing/Undressing Upper body dressing   What is the patient wearing?: Dress    Upper body assist Assist Level: Supervision/Verbal cueing    Lower Body Dressing/Undressing Lower body dressing      What is the patient wearing?: Incontinence brief     Lower body assist Assist for lower body dressing: Total Assistance - Patient < 25%     Toileting Toileting    Toileting assist Assist for toileting: Maximal Assistance - Patient 25 - 49%     Transfers Chair/bed transfer  Transfers assist     Chair/bed transfer assist level: Minimal Assistance - Patient > 75%     Locomotion Ambulation   Ambulation assist      Assist level: 2 helpers Assistive device: Walker-rolling Max distance: 35ft   Walk 10 feet activity   Assist     Assist level: 2 helpers Assistive device: Walker-rolling   Walk 50 feet activity   Assist Walk 50 feet with 2 turns activity did not occur: Safety/medical concerns (fatigue, generalized weakness, decresaed balance)         Walk 150 feet activity   Assist Walk 150 feet activity did not occur: Safety/medical concerns (fatigue, generalized weakness, decresaed balance)         Walk 10 feet on uneven surface  activity   Assist Walk 10 feet on uneven surfaces activity did not occur: Safety/medical concerns (fatigue, generalized weakness, decresaed balance)  Wheelchair     Assist Will patient use wheelchair at discharge?: Yes Type of Wheelchair: Manual    Wheelchair assist level: Supervision/Verbal cueing Max wheelchair distance: 137ft    Wheelchair 50 feet with 2 turns activity    Assist        Assist Level: Supervision/Verbal cueing   Wheelchair 150 feet activity     Assist      Assist Level: Supervision/Verbal cueing     Medical Problem List and Plan: 1.  Debility secondary to sepsis due to sigmoid diverticulitis with contained perforation.  Status post colectomy colostomy placement 05/16/2020 per Dr. Larae Grooms.WOC follow-up education ostomy and maintenance of wound VAC  Continue CIR  2.  Antithrombotics: -DVT/anticoagulation: Eliquis             -antiplatelet therapy: N/A 3. Pain Management: Neurontin 400 mg 3 times daily oxycodone as needed  Complaining of abdominal pain 1/3- received tylenol this morning, has not received oxycodone, she is trying to limit use which I commended. Discussed changing oxycodone to q8H prn for severe pain and adding tylenol with codeine 1 tab q4H prn for moderate pain and she is agreeable.   Moderate increased activity 4. Mood: Zoloft 50 mg daily             -antipsychotic agents: N/A 5. Neuropsych: This patient is capable of making decisions on her own behalf. 6. Skin/Wound Care: Routine skin checks wound VAC changes as directed with wound care nurse ostomy education  Dan called Dr. Henreitta Leber to check how long wound vac needed on 1/3.  7. Fluids/Electrolytes/Nutrition: Strict ins and outs 8.  Acute blood loss anemia.    Hemoglobin 10.8 on 12/31  Continue to monitor 9. Steroid-induced hyperglycemia on diabetes mellitus with hyperglycemia.  Hemoglobin A1c 7.1.  Levemir 18 units daily.  Check blood sugars before meals and at bedtime  CBGs changed to ACHS   Slightly labile on 1/2, monitor for trend with adjusted CBGs  Moderate increased mobility 10.  Atrial fibrillation.  Cardizem 240 daily.  Continue Eliquis.  Cardiac rate controlled  Rate relatively controlled on 1/3- monitor TID  Monitor heart rate with increased activity. 11.  Adrenal insufficiency.  Continue prednisone chronically 10 mg daily 12. Chronic diastolic congestive heart failure.  Monitor for any signs of fluid overload.Lasix 40 mg BID Filed Weights   05/25/20 1229 05/25/20 1406  Weight: 126.5 kg 126.5  kg   13.  COPD.  Continue inhalers as directed  Continue supplemental oxygen, 2 L at baseline 14.  CAD with history of prior stenting.  No chest pain or increasing shortness of breath 15.  Morbid obesity.  BMI 49.68.  Dietary follow-up 16.  Hyperlipidemia.  Lipitor 17.  Hypothyroidism.  Synthroid 18.  Overactive bladder.  Enablex 7.5 mg daily,Myrbetriq 50 mg daily  Will use depends if needed 19. Hypoalbuminemia  Supplement initiated on 12/31    LOS: 4 days A FACE TO FACE EVALUATION WAS PERFORMED  Drema Pry Quindarius Cabello 05/29/2020, 8:34 AM

## 2020-05-29 NOTE — Progress Notes (Signed)
Occupational Therapy Session Note  Patient Details  Name: Ann [name] MRN: [id number] Date of Birth: 06-29-1945  Today's Date: 05/29/2020 OT Individual Time: 1110-1210 and 1400-1425 OT Individual Time Calculation (min): 60 min and 25 min   Short Term Goals: Week 1:  OT Short Term Goal 1 (Week 1): STG = LTGs due to estimated LOS  Skilled Therapeutic Interventions/Progress Updates:    1) Treatment session with focus on functional mobility and activity tolerance.  Pt received upright in w/c reporting pain in abdomen, attributing it to attempting to sleep on her side overnight. Pt willing to engage in therapy session to pain tolerance.  Pt completed functional mobility in room with RW with CGA and assistance to manage O2 and wound vac tubing.  Therapist challenged pt to engage in tasks at sit > stand level to challenge standing balance and endurance.  Pt completed x1 but then refused additional standing due to abdominal pain and reports that any IADL tasks that she assists with she completes from seated level.  Engaged in table top task with focus on anterior weight shifting and reaching to complete task, while discussing functional carryover to ADLs and IADLs.  Pt returned to room and completed ambulatory transfer back to bed with RW with CGA while pt managed O2 and wound vac tubing.  Pt returned to EOB and remained seated EOB with nurse tech arriving to set up for lunch.  2) Treatment session with focus on self-care retraining.  Pt received supine in bed reporting some relief in abdominal pain as wound vac has been removed.  Pt reports plan to have son and husband present to learn how to care for wound/dressing.  Therapist discussed pt current progress towards goals and need to focus on LB dressing and toileting as pt continues to require assistance due to body habitus and ostomy bag and abdominal wound.  Therapist encouraged attempting LB dressing with use of AE and husband to bring in pullup style  incontinence briefs (as pt wore PTA) to begin practicing with dressing and toileting.  Pt reports that he may come tomorrow, weather dependent.  Therapist demonstrated use of reacher with pt reporting understanding and agreeable to attempting when husband brings personal clothing.  Pt remained semi-reclined in bed and left with all needs in reach.  Therapy Documentation Precautions:  Precautions Precautions: Fall Precaution Comments: dependent on supplemental O2; wound vac Restrictions Weight Bearing Restrictions: No General:   Vital Signs: Therapy Vitals Temp: 99.1 F (37.3 C) Temp Source: Oral Pulse Rate: 82 Resp: 18 BP: (!) 122/54 Patient Position (if appropriate): Lying Oxygen Therapy SpO2: 94 % O2 Device: Nasal Cannula O2 Flow Rate (L/min): 2 L/min Pain:   Pt with c/o pain 5/10 in abdomen, premedicated.   2) Pt with c/o pain 4/10 in abdomen, reports some relief since wound vac removed.  Therapy/Group: Individual Therapy  Rosalio Loud 05/29/2020, 3:03 PM

## 2020-05-29 NOTE — Progress Notes (Signed)
Wound care nurse in with pt to remove wound vac. Pt incision covered with W/D, ABD and medipore. Site beefy red, granulation tissue present. No complications noted.  Mylo Red, LPN

## 2020-05-29 NOTE — Progress Notes (Signed)
Occupational Therapy Session Note  Patient Details  Name: Ann [name] MRN: [id number] Date of Birth: 04-28-1946  Today's Date: 05/29/2020 OT Individual Time: 0930-1030 OT Individual Time Calculation (min): 60 min    Short Term Goals: Week 1:  OT Short Term Goal 1 (Week 1): STG = LTGs due to estimated LOS  Skilled Therapeutic Interventions/Progress Updates:    Pt received in recliner stating she was hurting quite a bit from her surgery sites.  She said she was feeling very achy but already had pain medication. Discussed what she felt she could handle. Pt very eager to get her hair washed.  Pt agreed to try to stand and transfer to W/C to sit at sink with shampoo tray. Pt held tray with hands so therapist could wash and conditioner her hair. Pt able to stand and transfer with CGA using her RW.  She then sat facing the sink, and worked on grooming.  Pt then wanted to toilet.  She used her RW to walk into bathroom with CGA and able to sit and stand from elevated toilet with S.  She needs total A to cleanse (urine) and manage briefs due to body habitus and colostomy bag.   Her NT arrived to cleanse out her colostomy bag. Pt opted to stay in w/c knowing that her next therapist would arrive in 30 min.  Therapy Documentation Precautions:  Precautions Precautions: Fall Precaution Comments: dependent on supplemental O2; wound vac Restrictions Weight Bearing Restrictions: No    Vital Signs: Therapy Vitals Temp: 98.2 F (36.8 C) Temp Source: Oral Pulse Rate: 77 BP: 116/74 Patient Position (if appropriate): Lying Oxygen Therapy SpO2: 97 % O2 Device: Nasal Cannula Pain: Pain Assessment Pain Scale: 0-10 Pain Score: 7  Pain Type: Acute pain Pain Location: Abdomen Pain Orientation: Lower Pain Descriptors / Indicators: Dull Pain Frequency: Intermittent Patients Stated Pain Goal: 2 Pain Intervention(s): Medication (See eMAR) ADL: ADL Grooming: Setup Where Assessed-Grooming:  Sitting at sink Upper Body Bathing: Supervision/safety,Setup Where Assessed-Upper Body Bathing: Wheelchair Lower Body Bathing: Moderate assistance Where Assessed-Lower Body Bathing: Wheelchair Upper Body Dressing: Setup Where Assessed-Upper Body Dressing: Wheelchair Lower Body Dressing: Maximal assistance Where Assessed-Lower Body Dressing: Wheelchair Toileting: Maximal assistance Where Assessed-Toileting: Bedside Commode Toilet Transfer: Minimal assistance Toilet Transfer Method: Stand pivot Acupuncturist: Extra wide bedside commode  Therapy/Group: Individual Therapy  Ragan Reale 05/29/2020, 8:28 AM

## 2020-05-30 ENCOUNTER — Inpatient Hospital Stay (HOSPITAL_COMMUNITY): Payer: Medicare Other | Admitting: Occupational Therapy

## 2020-05-30 ENCOUNTER — Ambulatory Visit: Payer: Medicare Other | Admitting: Cardiology

## 2020-05-30 ENCOUNTER — Inpatient Hospital Stay (HOSPITAL_COMMUNITY): Payer: Medicare Other

## 2020-05-30 LAB — GLUCOSE, CAPILLARY
Glucose-Capillary: 133 mg/dL — ABNORMAL HIGH (ref 70–99)
Glucose-Capillary: 118 mg/dL — ABNORMAL HIGH (ref 70–99)
Glucose-Capillary: 150 mg/dL — ABNORMAL HIGH (ref 70–99)
Glucose-Capillary: 144 mg/dL — ABNORMAL HIGH (ref 70–99)
Glucose-Capillary: 133 mg/dL — ABNORMAL HIGH (ref 70–99)

## 2020-05-30 NOTE — Progress Notes (Signed)
[facility] PHYSICAL MEDICINE & REHABILITATION PROGRESS NOTE  Subjective/Complaints: Patient seen sitting up in bed this morning.  She states she slept well overnight.  She states she feels better without a wound VAC.  She has questions regarding discharge date.  ROS: Denies CP, SOB, N/V/D  Objective: Vital Signs: Blood pressure 134/79, pulse (!) 56, temperature 97.8 F (36.6 C), resp. rate 18, height 5\' 3"  (1.6 m), weight 126.5 kg, SpO2 95 %. No results found. No results for input(s): WBC, HGB, HCT, PLT in the last 72 hours. No results for input(s): NA, K, CL, CO2, GLUCOSE, BUN, CREATININE, CALCIUM in the last 72 hours.  Intake/Output Summary (Last 24 hours) at 05/30/2020 1027 Last data filed at 05/30/2020 0700 Gross per 24 hour  Intake 580 ml  Output 450 ml  Net 130 ml   Physical Exam: BP 134/79 (BP Location: Right Arm)   Pulse (!) 56   Temp 97.8 F (36.6 C)   Resp 18   Ht 5\' 3"  (1.6 m)   Wt 126.5 kg   SpO2 95%   BMI 49.40 kg/m   Constitutional: No distress . Vital signs reviewed. HENT: Normocephalic.  Atraumatic. Eyes: EOMI. No discharge. Cardiovascular: No JVD.  Irregularly irregular. Respiratory: Normal effort.  No stridor.  Bilateral clear to auscultation.  + [place]. GI: Non-distended.  BS +.  + Colostomy. Skin: Warm and dry.  Abdomen with granulation and fibrinous tissue.Marland Kitchen Psych: Normal mood.  Normal behavior. Musc: No edema in extremities.  No tenderness in extremities. Neuro: Alert Motor: Bilateral upper extremities: 5/5 proximal distal Bilateral lower extremities: 4-4+/5 proximal to distal, stable  Assessment/Plan: 1. Functional deficits which require 3+ hours per day of interdisciplinary therapy in a comprehensive inpatient rehab setting.  Physiatrist is providing close team supervision and 24 hour management of active medical problems listed below.  Physiatrist and rehab team continue to assess barriers to discharge/monitor patient progress toward functional and  medical goals   Care Tool:  Bathing    Body parts bathed by patient: Right arm,Left arm,Chest,Abdomen,Right upper leg,Left upper leg,Face   Body parts bathed by helper: Front perineal area,Buttocks,Right lower leg,Left lower leg     Bathing assist Assist Level: Moderate Assistance - Patient 50 - 74%     Upper Body Dressing/Undressing Upper body dressing   What is the patient wearing?: Dress    Upper body assist Assist Level: Supervision/Verbal cueing    Lower Body Dressing/Undressing Lower body dressing      What is the patient wearing?: Incontinence brief     Lower body assist Assist for lower body dressing: Total Assistance - Patient < 25%     Toileting Toileting    Toileting assist Assist for toileting: Maximal Assistance - Patient 25 - 49%     Transfers Chair/bed transfer  Transfers assist     Chair/bed transfer assist level: Contact Guard/Touching assist     Locomotion Ambulation   Ambulation assist      Assist level: 2 helpers Assistive device: Walker-rolling Max distance: 66ft   Walk 10 feet activity   Assist     Assist level: 2 helpers Assistive device: Walker-rolling   Walk 50 feet activity   Assist Walk 50 feet with 2 turns activity did not occur: Safety/medical concerns (fatigue, generalized weakness, decresaed balance)         Walk 150 feet activity   Assist Walk 150 feet activity did not occur: Safety/medical concerns (fatigue, generalized weakness, decresaed balance)  Walk 10 feet on uneven surface  activity   Assist Walk 10 feet on uneven surfaces activity did not occur: Safety/medical concerns (fatigue, generalized weakness, decresaed balance)         Wheelchair     Assist Will patient use wheelchair at discharge?: Yes Type of Wheelchair: Manual    Wheelchair assist level: Supervision/Verbal cueing Max wheelchair distance: 160ft    Wheelchair 50 feet with 2 turns  activity    Assist        Assist Level: Supervision/Verbal cueing   Wheelchair 150 feet activity     Assist      Assist Level: Supervision/Verbal cueing    Medical Problem List and Plan: 1.  Debility secondary to sepsis due to sigmoid diverticulitis with contained perforation.  Status post colectomy colostomy placement 05/16/2020 per Dr. Larae Grooms.WOC follow-up education ostomy and maintenance of wound VAC  Continue CIR 2.  Antithrombotics: -DVT/anticoagulation: Eliquis             -antiplatelet therapy: N/A 3. Pain Management: Neurontin 400 mg 3 times daily oxycodone as needed  Tylenol with codeine 1 tab q4H prn for moderate pain  Improving after DC of wound VAC.   Moderate increased activity 4. Mood: Zoloft 50 mg daily             -antipsychotic agents: N/A 5. Neuropsych: This patient is capable of making decisions on her own behalf. 6. Skin/Wound Care: Routine skin checks with wound care nurse ostomy education  Wound VAC DC'd on 1/3 7. Fluids/Electrolytes/Nutrition: Strict ins and outs 8.  Acute blood loss anemia.    Hemoglobin 10.8 on 12/31, labs ordered for tomorrow  Continue to monitor 9. Steroid-induced hyperglycemia on diabetes mellitus with hyperglycemia.  Hemoglobin A1c 7.1.  Levemir 18 units daily.  Check blood sugars before meals and at bedtime  CBGs changed to ACHS   Slightly labile on 1/4  Moderate increased mobility 10.  Atrial fibrillation.  Cardizem 240 daily.  Continue Eliquis.  Cardiac rate controlled  Rate relatively controlled on 1/4- monitor TID  Monitor heart rate with increased activity. 11.  Adrenal insufficiency.  Continue prednisone chronically 10 mg daily 12. Chronic diastolic congestive heart failure.  Monitor for any signs of fluid overload.Lasix 40 mg BID Filed Weights   05/25/20 1229 05/25/20 1406  Weight: 126.5 kg 126.5 kg   13.  COPD.  Continue inhalers as directed  Continue supplemental oxygen, 2 L at baseline 14.  CAD  with history of prior stenting.  No chest pain or increasing shortness of breath 15.  Morbid obesity.  BMI 49.68.  Dietary follow-up 16.  Hyperlipidemia.  Lipitor 17.  Hypothyroidism.  Synthroid 18.  Overactive bladder.  Enablex 7.5 mg daily,Myrbetriq 50 mg daily  Will use depends if needed 19. Hypoalbuminemia  Supplement initiated on 12/31    LOS: 5 days A FACE TO FACE EVALUATION WAS PERFORMED  Ann [name] Karis Juba 05/30/2020, 10:27 AM

## 2020-05-30 NOTE — Progress Notes (Signed)
Occupational Therapy Session Note  Patient Details  Name: Ann [name] MRN: [id number] Date of Birth: [birth year]  Today's Date: 05/30/2020 OT Individual Time: 1100-1200 OT Individual Time Calculation (min): 60 min    Short Term Goals: Week 1:  OT Short Term Goal 1 (Week 1): STG = LTGs due to estimated LOS  Skilled Therapeutic Interventions/Progress Updates:    Treatment session with focus on LB dressing, toileting hygiene, and functional transfers.  Pt received upright in recliner reporting that her son brought in her pullup incontinence briefs.  Pt willing to attempt donning and also requested to toilet.  Pt able to thread BLE with increased time and effort with LLE but able to complete without assistance.  Due to urgency, pt requested to complete stand pivot to [facility].  Pt completed stand pivot to [facility] with RW with CGA to close supervision.  Pt unable to complete hygiene due to body habitus, however pt reports that she typically uses long handled sponge/toilet aid for hygiene post toileting.  Pt able to pull underwear up over hips with CGA for stability in standing. Pt reports "swimmy headed" post toileting and transfer back to recliner.  BP assessed 122/78 but HR fluctuating from 39-125 due to reports of Afib.  Nurse tech present and took a radial pulse, stating that it was faint and inconsistent but not as widely ranging as per dinamap.  Pt continues to report "swimmy headed" - RN notified. Discussed strategies and options for modifying ADLs and IADL when feeling "swimmy headed" to increase safety.  Pt remained upright in recliner with all needs in reach.  Therapy Documentation Precautions:  Precautions Precautions: Fall Precaution Comments: dependent on supplemental O2; wound vac Restrictions Weight Bearing Restrictions: No General:   Vital Signs: Therapy Vitals Pulse Rate: 85 Resp: 18 BP: 94/75 Patient Position (if appropriate): Sitting Pain: Pain Assessment Pain Scale: 0-10 Pain  Score: 0-No pain Faces Pain Scale: No hurt Pain Type: Acute pain Pain Location: Abdomen Pain Orientation: Lower   Therapy/Group: Individual Therapy  Rosalio Loud 05/30/2020, 12:27 PM

## 2020-05-30 NOTE — Consult Note (Signed)
WOC Nurse ostomy follow up Patient receiving care in [facility] 4W01 Stoma type/location: Budded LUQ Stomal assessment/size: Moist pink purple 1 3/4" Peristomal assessment: Starting some mucocutaneous separation. Noted some bleeding when cleaning around the stoma Treatment options for stomal/peristomal skin: Cleaned the area with plain water. Let dry. Coated the area with no sting barrier film. May need to crust at next pouch change Output: Thin green/brown Ostomy pouching: 2pc. 2 3/4" [place] # [id number]) Skin barrier [place] # 2) Skin barrier rings [place] # [phone]) Education provided: Shari Heritage [name] was present for teaching and pouch change. He was very proactive in removing, cleaning and placement of new pouch. He completed the entire pouch change with very little assistance. Educated him on using water only to clean around the stoma, opening and closing of the pouch. We did not empty the pouch but he was able to close the new pouch and demonstrate opening and emptying. A barrier ring was shaped and applied around the stoma. The skin barrier was cut at 1 3/4" and applied by the son over the barrier ring. Pouch snapped into place without any difficulty. Reminded him to double check the pouch to make sure it is snapped closed to prevent any leakage. Educated him on crusting if the need was to ever arise. No further education needed for [name] at this time. The patient is not wanting to do the pouch change herself because of the smell but reminded her that she would need to know how to do it. WOC will return on Friday for more education for the patient.   Ann Reel Katrinka Blazing, MSN, RN, CMSRN, Angus Seller, [facility] Wound Treatment Associate Pager [phone]

## 2020-05-30 NOTE — Progress Notes (Signed)
Physical Therapy Session Note  Patient Details  Name: Ann [name] MRN: [id number] Date of Birth: 01-16-1946  Today's Date: 05/30/2020 PT Individual Time: [phone] and 1300-1413  PT Individual Time Calculation (min): 58 min and 73 min   Short Term Goals: Week 1:  PT Short Term Goal 1 (Week 1): STG=LTG due to LOS  Skilled Therapeutic Interventions/Progress Updates:   Treatment Session 1: 0900-0958 58 min Received pt supine in bed, pt agreeable to therapy, and reported pain 1/10 in stomach. Session with emphasis on functional mobility/transfers, dressing, generalized strengthening, dynamic standing balance/coordinaiton, ambulation, and improved activity tolerance. Pt reported she thought she had a RW at home but per her daughter she has 2 standard walkers. Pt reported her daughter bought her a RW yesterday and she won't need [place] to order her one. O2 sat 96% on 2L O2 at rest. Pt transferred supine<>sitting EOB with HOB elevated and supervision and doffed dirty gown and donned pull over dress and shoes with set up assist. Pt transferred bed<>WC with RW and CGA/close supervision. Pt performed WC mobility 146ft x 1 and 47ft x1 using BUE and supervision with increased time due to fatigue and poor activity tolerance. Pt required multiple rest/water breaks throughout session due to fatigue. Pt ambulated 19ft with RW and CGA (to hold brief up). Pt demonstrated flexed trunk, decreased stride length, and narrow BOS and required cues for upright posture and increased step length. Pt with significant SOB after ambulating and O2 sat 96% and HR 118bpm. Pt stated "don't make me do that again" reporting 8/10 fatigue after activity. Pt performed the following exercises sitting in [facility] with supervision and verbal cues for technique: -LAQ with 1.5lb ankle weight 2x12 bilaterally -hip flexion with 1.5lb ankle weight 2x12 bilaterally -hip adduction ball squeezes 3x10 Pt transported back to room in [facility] total A and requested  to use restroom. Pt transferred WC<> bedside commode with RW and supervision. Pt required min A to doff brief and able to void. Concluded session with pt sitting on bedside commode with NT present attending to care.   Treatment Session 2: 1300-1413 73 min Received pt sitting in recliner offloading L hip, pt reported feeling "bump" between upper leg and hip. RN, PT, and PA inspected areas and per pt, RN and PA with no concerns of area. This therapist noted redness around area. Pt agreeable to therapy, and reported discomfort along L arm from dressing from PICC line. RN present to remove dressing. Session with emphasis on functional mobility/transfers, generalized strengthening, dynamic standing balance/coordinaiton, and improved activity tolerance. Pt transferred recliner<>WC stand<>pivot with RW and supervision. O2 sat 98% and HR 96bpm on 2L O2 via [place]. Pt transported to ortho gym in [facility] total A and performed simulated car transfer with RW and CGA/close supervision from low seat height. Pt transported to dayroom in [facility] total A and requested to be weighed on scale. Pt transferred sit<>stand without AD and CGA and stepped up onto scale with CGA/min A to get weight. Pt stepped off scale with min A and transferred stand<>pivot WC<>Nustep with RW and close supervision. Pt performed BUE/LE strengthening on level 2 for 1 minute decreasing to level 1 for additional minute prior to resting as pt stated "I don't think I can do this." However, pt continued for additional 1.5 minutes twice for a total of 5 minutes for 278 steps for improved cardiovascular endurance. Pt performed BLE strengthening on Kinetron at 30cm/sec for 1 minute x 3 trials with emphasis on glute/quad strengthening. Pt  transported back to room in [facility] total A and requested to return to bed, however, noted soiled linens. Notified NT and NT present changing linens and planning to assist pt to bed. Concluded session with pt sitting in WC with NT present at  bedside. Therapist provided fresh ice water to pt.   Therapy Documentation Precautions:  Precautions Precautions: Fall Precaution Comments: dependent on supplemental O2; wound vac Restrictions Weight Bearing Restrictions: No   Therapy/Group: Individual Therapy Alfonse Alpers PT, DPT   05/30/2020, 7:23 AM

## 2020-05-31 ENCOUNTER — Inpatient Hospital Stay (HOSPITAL_COMMUNITY): Payer: Medicare Other

## 2020-05-31 ENCOUNTER — Inpatient Hospital Stay (HOSPITAL_COMMUNITY): Payer: Medicare Other | Admitting: Occupational Therapy

## 2020-05-31 LAB — GLUCOSE, CAPILLARY
Glucose-Capillary: 104 mg/dL — ABNORMAL HIGH (ref 70–99)
Glucose-Capillary: 171 mg/dL — ABNORMAL HIGH (ref 70–99)
Glucose-Capillary: 123 mg/dL — ABNORMAL HIGH (ref 70–99)
Glucose-Capillary: 118 mg/dL — ABNORMAL HIGH (ref 70–99)

## 2020-05-31 LAB — CBC WITH DIFFERENTIAL/PLATELET
Abs Immature Granulocytes: 0.03 10*3/uL (ref 0.00–0.07)
Basophils Absolute: 0.1 10*3/uL (ref 0.0–0.1)
Basophils Relative: 1 %
Eosinophils Absolute: 0.2 10*3/uL (ref 0.0–0.5)
Eosinophils Relative: 3 %
HCT: 38.1 % (ref 36.0–46.0)
Hemoglobin: 11.2 g/dL — ABNORMAL LOW (ref 12.0–15.0)
Immature Granulocytes: 0 %
Lymphocytes Relative: 21 %
Lymphs Abs: 1.8 10*3/uL (ref 0.7–4.0)
MCH: 25.1 pg — ABNORMAL LOW (ref 26.0–34.0)
MCHC: 29.4 g/dL — ABNORMAL LOW (ref 30.0–36.0)
MCV: 85.2 fL (ref 80.0–100.0)
Monocytes Absolute: 0.9 10*3/uL (ref 0.1–1.0)
Monocytes Relative: 11 %
Neutro Abs: 5.2 10*3/uL (ref 1.7–7.7)
Neutrophils Relative %: 64 %
Platelets: 439 10*3/uL — ABNORMAL HIGH (ref 150–400)
RBC: 4.47 MIL/uL (ref 3.87–5.11)
RDW: 17.1 % — ABNORMAL HIGH (ref 11.5–15.5)
WBC: 8.3 10*3/uL (ref 4.0–10.5)
nRBC: 0 % (ref 0.0–0.2)

## 2020-05-31 MED ORDER — APIXABAN 5 MG PO TABS: 5.0000 mg | ORAL_TABLET | Freq: Two times a day (BID) | ORAL | 3 refills | Status: DC

## 2020-05-31 MED ORDER — TRAZODONE HCL 50 MG PO TABS: 50.0000 mg | ORAL_TABLET | Freq: Every evening | ORAL | 0 refills | Status: DC | PRN

## 2020-05-31 MED ORDER — FUROSEMIDE 40 MG PO TABS: 40.0000 mg | ORAL_TABLET | Freq: Two times a day (BID) | ORAL | 11 refills | Status: DC

## 2020-05-31 MED ORDER — SPIRIVA RESPIMAT 2.5 MCG/ACT IN AERS: 2.0000 | INHALATION_SPRAY | Freq: Every day | RESPIRATORY_TRACT | 0 refills | Status: DC

## 2020-05-31 MED ORDER — VITAMIN D3 125 MCG (5000 UT) PO CAPS: 5000.0000 [IU] | ORAL_CAPSULE | Freq: Every day | ORAL | 0 refills | Status: AC

## 2020-05-31 MED ORDER — SERTRALINE HCL 100 MG PO TABS: 50.0000 mg | ORAL_TABLET | Freq: Every day | ORAL | 11 refills | Status: DC

## 2020-05-31 MED ORDER — MIRABEGRON ER 50 MG PO TB24: 50.0000 mg | ORAL_TABLET | Freq: Every day | ORAL | 0 refills | Status: DC

## 2020-05-31 MED ORDER — INSULIN DETEMIR 100 UNIT/ML FLEXPEN: 18.0000 [IU] | PEN_INJECTOR | Freq: Every day | SUBCUTANEOUS | 11 refills | Status: DC

## 2020-05-31 MED ORDER — PREDNISONE 10 MG PO TABS: 10.0000 mg | ORAL_TABLET | Freq: Every day | ORAL | 0 refills | Status: DC

## 2020-05-31 MED ORDER — ESOMEPRAZOLE MAGNESIUM 40 MG PO CPDR: 40.0000 mg | DELAYED_RELEASE_CAPSULE | Freq: Two times a day (BID) | ORAL | 3 refills | Status: DC

## 2020-05-31 MED ORDER — ALBUTEROL SULFATE HFA 108 (90 BASE) MCG/ACT IN AERS: 2.0000 | INHALATION_SPRAY | RESPIRATORY_TRACT | 0 refills | Status: AC | PRN

## 2020-05-31 MED ORDER — GABAPENTIN 400 MG PO CAPS: 400.0000 mg | ORAL_CAPSULE | Freq: Three times a day (TID) | ORAL | 0 refills | Status: AC

## 2020-05-31 MED ORDER — OXYCODONE HCL 5 MG PO TABS: 5.0000 mg | ORAL_TABLET | Freq: Three times a day (TID) | ORAL | 0 refills | Status: DC | PRN

## 2020-05-31 MED ORDER — ATORVASTATIN CALCIUM 80 MG PO TABS: 80.0000 mg | ORAL_TABLET | Freq: Every day | ORAL | 1 refills | Status: DC

## 2020-05-31 MED ORDER — CYANOCOBALAMIN 1000 MCG PO TABS: 1000.0000 ug | ORAL_TABLET | Freq: Every day | ORAL | 0 refills | Status: AC

## 2020-05-31 MED ORDER — LEVOTHYROXINE SODIUM 175 MCG PO TABS: 175.0000 ug | ORAL_TABLET | Freq: Every morning | ORAL | 0 refills | Status: DC

## 2020-05-31 MED ORDER — DILTIAZEM HCL ER COATED BEADS 240 MG PO CP24
240.0000 mg | ORAL_CAPSULE | Freq: Every day | ORAL | 0 refills | Status: DC
Start: 2020-06-01 — End: 2022-10-17

## 2020-05-31 NOTE — Progress Notes (Signed)
Patient ID: Ann [name], female   DOB: [birth year], 75 y.o.   MRN: [id number]  Met with pt and husband to inform of team conference goals supervision-CGA level and target discharge date 1/7. Both are pleased with her progress and feel she will be ready to go home Friday. Has all equipment and home health set up via [facility]. Will work on discharge needs.

## 2020-05-31 NOTE — Progress Notes (Signed)
Patient had a good day.  She is excited to go home this week.  RN gave more education on emptying colostomy.  Patient stated, "My son will be taking care of that when I get home."  Patient had some c/o pain today in abdomen and left ear.  RN placed ear drops and attempted the suction bulb to get some ear wax out.  No ear wax removed.  Patient stated, "My left ear is feeling better this evening."  Patient eating dinner with no c/o at this time.

## 2020-05-31 NOTE — Progress Notes (Signed)
Occupational Therapy Session Note  Patient Details  Name: Ann [name] MRN: [id number] Date of Birth: 06/30/1945  Today's Date: 05/31/2020 OT Individual Time: 1443-1540 OT Individual Time Calculation (min): 57 min    Short Term Goals: Week 1:  OT Short Term Goal 1 (Week 1): STG = LTGs due to estimated LOS  Skilled Therapeutic Interventions/Progress Updates:    Treatment session with focus on functional mobility and self-care tasks. Pt received upright in w/c with legs elevated, reporting pain in LLE and abdomen.  Pt reports incident overnight with incontinence and bumping ostomy site when nursing assisting with hygiene causing bleeding and soreness.  Pt agreeable to functional mobility in ADL apt.  Pt ambulated 20-25' x4 with RW with Supervision in ADL apt over carpet, completing various furniture transfers.  Discussed home setup and typical places to sit in home.  Pt completed toilet transfer and toileting while in ADL apt with supervision, only requiring assistance for hygiene as pt uses long handled sponge/toilet aid for hygiene at home.  Pt reports sitting in kitchen chair at table to assist with meal prep and husband doing the cooking.  Discussed walk-in shower transfers, with pt reporting that she does not plan to shower until her abdominal wounds are more healed.  Pt returned to room and ambulated 20' with RW supervision for endurance.  Pt returned to upright in w/c with legs elevated and all needs in reach.  Therapy Documentation Precautions:  Precautions Precautions: Fall Precaution Comments: dependent on supplemental O2; wound vac Restrictions Weight Bearing Restrictions: No Pain: Pain Assessment Pain Scale: 0-10 Pain Score: 4  Faces Pain Scale: Hurts a little bit Pain Type: Acute pain Pain Location: Abdomen Pain Orientation: Right Pain Descriptors / Indicators: Dull;Sharp Pain Frequency: Intermittent Pain Onset: On-going Patients Stated Pain Goal: 2   Therapy/Group:  Individual Therapy  Rosalio Loud 05/31/2020, 12:31 PM

## 2020-05-31 NOTE — Patient Care Conference (Signed)
Inpatient RehabilitationTeam Conference and Plan of Care Update Date: 05/31/2020   Time: 11:35 AM    Patient Name: Ann [name]      Medical Record Number: [id number]  Date of Birth: 10-20-1945 Sex: Female         Room/Bed: 4W01C/4W01C-01 Payor Info: Payor: MEDICARE / Plan: MEDICARE PART A AND B / Product Type: *No Product type* /    Admit Date/Time:  05/25/2020 12:23 PM  Primary Diagnosis:  Debility  Hospital Problems: Principal Problem:   Debility Active Problems:   Hypoalbuminemia due to protein-calorie malnutrition (HCC)   Chronic diastolic congestive heart failure (HCC)   Acute blood loss anemia   Supplemental oxygen dependent   Steroid-induced hyperglycemia   Controlled type 2 diabetes mellitus with hyperglycemia, with long-term current use of insulin (HCC)   Labile blood glucose    Expected Discharge Date: Expected Discharge Date: 06/02/20  Team Members Present: Physician leading conference: Dr. Maryla Morrow Care Coodinator Present: Dossie Der, LCSW;Lolah Coghlan Lambert Mody, RN, BSN, CRRN Nurse Present: Other (comment) Lupita Dawn, RN) PT Present: Raechel Chute, PT OT Present: Rosalio Loud, OT PPS Coordinator present : Fae Pippin, [place], PT     Current Status/Progress Goal Weekly Team Focus  Bowel/Bladder   Pt is continent of urine, but with occasional incontinence. Soft stool in ostomy bag.  Regular stools in ostomy bag  Assess B/B every shift and PRN   Swallow/Nutrition/ Hydration             ADL's   CGA transfers with RW, Min A toileting, CGA LB dressing, CGA bathing and UB dressing  Supervision overall, Min assist LB dressing and bathing (per routine PTA)  ADL retraining, transfers, dynamic standing, and endurance   Mobility   bed mobility supervision, transfers with RW CGA, gait 48ft with RW CGA  supervision  functional mobility/transfers, generalized strengthening, dynamic standing balance/coordination, ambulation, endurance.   Communication              Safety/Cognition/ Behavioral Observations            Pain   Occasional surgical incision pain to abdomen managed well with Tylenol 3.  Pain scale <3/10  Assess pain every shift and PRN   Skin   Open abdominal incision healthy and granulating with no S/Sx of infection  no new skin breakdown and continued healing of abdominal wound  Assess skin every shift and PRN     Discharge Planning:  Home with husband and multiple family members who will assist her. has all DME and is set up with [facility] as an active pt   Team Discussion: Oxygen therapy continued. Wound vac discontinued. Ostomy care education ongoing. CBGs managed. Progress limited by pain and endurance. Patient on target to meet rehab goals: yes, currently CGA - supervison for transfers and min assist lower body dressing set up for bathing with supervision goals for discharge.  *See Care Plan and progress notes for long and short-term goals.   Revisions to Treatment Plan:   Teaching Needs: Ostomy care, incision care, toileting, transfers, medications, etc.  Current Barriers to Discharge: None  Possible Resolutions to Barriers: [facility] with [facility] Family education     Medical Summary Current Status: Debility secondary to sepsis due to sigmoid diverticulitis with contained perforation.  Status post colectomy colostomy placement 05/16/2020 per Dr. Larae Grooms.WOC follow-up education ostomy.  Barriers to Discharge: Medical stability;Wound care;Weight   Possible Resolutions to [employer] Focus: Therapies, cont pain meds, follow labs - Hb, follow weights,  cont O2   Continued Need for Acute Rehabilitation Level of Care: The patient requires daily medical management by a physician with specialized training in physical medicine and rehabilitation for the following reasons: Direction of a multidisciplinary physical rehabilitation program to maximize functional independence : Yes Medical  management of patient stability for increased activity during participation in an intensive rehabilitation regime.: Yes Analysis of laboratory values and/or radiology reports with any subsequent need for medication adjustment and/or medical intervention. : Yes   I attest that I was present, lead the team conference, and concur with the assessment and plan of the team.   Dorien Chihuahua B 05/31/2020, 2:54 PM

## 2020-05-31 NOTE — Discharge Summary (Addendum)
Physician Discharge Summary  Patient ID: Ann [name] MRN: [id number] DOB/AGE: [birth year] 75 y.o.  Admit date: 05/25/2020 Discharge date: 06/02/2020  Discharge Diagnoses:  Principal Problem:   Debility Active Problems:   Hypoalbuminemia due to protein-calorie malnutrition (HCC)   Chronic diastolic congestive heart failure (HCC)   Acute blood loss anemia   Supplemental oxygen dependent   Steroid-induced hyperglycemia   Controlled type 2 diabetes mellitus with hyperglycemia, with long-term current use of insulin (HCC)   Labile blood glucose Pain management Sigmoid diverticulitis with contained perforation Atrial fibrillation CAD with prior stenting Morbid obesity Hyperlipidemia Hypothyroidism Overactive bladder Adrenal insufficiency COPD  Discharged Condition: Stable  Significant Diagnostic Studies: CT ABDOMEN PELVIS W CONTRAST  Result Date: 05/14/2020 CLINICAL DATA:  Lower abdominal pain, rectal bleeding EXAM: CT ABDOMEN AND PELVIS WITH CONTRAST TECHNIQUE: Multidetector CT imaging of the abdomen and pelvis was performed using the standard protocol following bolus administration of intravenous contrast. CONTRAST:  196mL OMNIPAQUE IOHEXOL 300 MG/ML  SOLN COMPARISON:  06/17/2017 FINDINGS: Lower chest: Trace bilateral pleural effusions. No acute airspace disease. Hepatobiliary: No focal liver abnormality is seen. Status post cholecystectomy. No biliary dilatation. Pancreas: Unremarkable. No pancreatic ductal dilatation or surrounding inflammatory changes. Spleen: Normal in size without focal abnormality. Adrenals/Urinary Tract: Adrenal glands are unremarkable. Kidneys are normal, without renal calculi, focal lesion, or hydronephrosis. Bladder is unremarkable. Stomach/Bowel: There is marked inflammation of the mid sigmoid colon, with wall thickening and pericolonic fat stranding. Findings are consistent with sigmoid diverticulitis. Additionally, there is a large contained perforation  containing gas and stool on image 66, measuring 6.9 x 4.5 cm. No evidence of abscess at this time. No bowel obstruction or ileus. Vascular/Lymphatic: Aortic atherosclerosis. No enlarged abdominal or pelvic lymph nodes. Reproductive: Status post hysterectomy. No adnexal masses. Other: No free fluid or free intraperitoneal gas. No abdominal wall hernia. Musculoskeletal: No acute or destructive bony lesions. Significant multilevel lumbar spondylosis greatest at L2-3 and L3-4. Reconstructed images demonstrate no additional findings. IMPRESSION: 1. Sigmoid diverticulitis, with contained perforation measuring 6.9 x 4.5 cm as above. No evidence of abscess at this time. 2.  Aortic Atherosclerosis (ICD10-I70.0). Critical Value/emergent results were called by telephone at the time of interpretation on 05/14/2020 at 7:39 pm to provider NP [facility] , who verbally acknowledged these results. Electronically Signed   By: Randa Ngo M.D.   On: 05/14/2020 19:43   DG CHEST PORT 1 VIEW  Result Date: 05/15/2020 CLINICAL DATA:  PICC line placement EXAM: PORTABLE CHEST 1 VIEW COMPARISON:  Portable exam 1145 hours compared 01/23/2020 FINDINGS: LEFT arm PICC line tip projects over SVC. Enlargement of cardiac silhouette. Atherosclerotic calcification aorta. Pulmonary vascularity normal. Mediastinum appears prominent but this may be related to AP technique. Subsegmental atelectasis at mid LEFT lung and minimally RIGHT base. No infiltrate, pleural effusion or pneumothorax. Bones demineralized with BILATERAL glenohumeral degenerative changes and prior cervical fusion. IMPRESSION: Tip of LEFT arm PICC line projects over SVC. Enlargement of cardiac silhouette. Mild scattered atelectasis. Electronically Signed   By: Lavonia Dana M.D.   On: 05/15/2020 11:56   DG ABD ACUTE 2+V W 1V CHEST  Result Date: 05/19/2020 CLINICAL DATA:  The reason for exam is stated as GI bleeding. Best obtainable images. Recent colostomy. EXAM: DG ABDOMEN  ACUTE WITH 1 VIEW CHEST COMPARISON:  None. FINDINGS: Anterior cervical fusion. Normal cardiac silhouette. Low lung volumes. Bilateral streaky pulmonary opacities. Low lung volumes. Probable pleural effusions. No dilated large or small bowel.  Gas in the rectum. IMPRESSION: 1. No  evidence of bowel obstruction. 2. Low lung volumes with streaky bilateral airspace opacities. Cannot exclude pneumonia. Probable effusions. Electronically Signed   By: Suzy Bouchard M.D.   On: 05/19/2020 08:21   [place] EKG SITE RITE  Result Date: 05/15/2020 If Site Rite image not attached, placement could not be confirmed due to current cardiac rhythm.   Labs:  Basic Metabolic Panel: No results for input(s): NA, K, CL, CO2, GLUCOSE, BUN, CREATININE, CALCIUM, MG, PHOS in the last 168 hours.  CBC: Recent Labs  Lab 05/31/20 0456  WBC 8.3  NEUTROABS 5.2  HGB 11.2*  HCT 38.1  MCV 85.2  PLT 439*    CBG: Recent Labs  Lab 05/31/20 2113 06/01/20 0600 06/01/20 1140 06/01/20 1632 06/01/20 2121  GLUCAP 118* 117* 167* 135* 201*   Family history.  Father with stomach cancer and CAD mother with CAD negative for colon cancer or esophageal cancer  Brief HPI:   Ann [name] is a 75 y.o. right-handed female with history of chronic anemia chronic diastolic congestive heart failure CAD with stenting prior history of diverticulosis diverticulitis GERD erosive gastritis chronic atrial fibrillation on Eliquis history of adrenal insufficiency maintained on chronic prednisone hypothyroidism COPD type 2 diabetes mellitus morbid obesity and hypertension.  Per chart review lives with spouse and multiple family members.  Used a rolling walker prior to admission.  Presented 05/14/2020 with abdominal pain x24 hours associated chills with nausea hematochezia but no emesis.  CT abdomen pelvis showed sigmoid diverticulitis with contained perforation measuring 6.9 x 4.5 cm.  No evidence of abscess.  Admission chemistries glucose 128  creatinine 1.07 hemoglobin 8.5 WBC 16,900 lactic acid 2.5 blood cultures no growth to date.  Patient underwent partial colectomy splenic flexure takedown and end colostomy for perforated sigmoid diverticulitis 05/16/2020 per Dr. Blake Divine and a wound VAC was applied.  Hospital course anemia hypotension required transfer to the ICU for monitoring latest hemoglobin 9.2.  Chronic Coumadin has since been resumed.  Diet advanced to mechanical soft.  Due to patient's decreased functional mobility she was admitted for a comprehensive rehab program.   Hospital Course: Ann [name] was admitted to rehab 05/25/2020 for inpatient therapies to consist of PT, ST and OT at least three hours five days a week. Past admission physiatrist, therapy team and rehab RN have worked together to provide customized collaborative inpatient rehab.  Pertaining to patient's debility related to sigmoid diverticulitis contained perforation status post colectomy colostomy 1221 per Dr. Blake Divine.  Wound VAC is since been discontinued wound care nurse follow-up education for ostomy patient would follow-up with surgery.  Chronic Eliquis has been resumed for atrial fibrillation cardiac rate controlled no bleeding episodes.  Pain managed use of scheduled Neurontin oxycodone as needed.  Mood stabilization with Zoloft.  Acute blood loss anemia 11.2 no bleeding episodes.  She had some mild steroid-induced hyperglycemia hemoglobin A1c 7.1 insulin therapy as directed.  Adrenal insufficiency continue chronic prednisone.  Chronic diastolic congestive heart failure exhibited no signs of fluid overload Lasix therapy as directed.  COPD oxygen therapy dependent prior to admission 2 L at baseline.  CAD with stenting no increased chest pain noted.  Morbid obesity BMI 49.68 dietary follow-up.  Lipitor for hyperlipidemia Synthroid ongoing for hypothyroidism.   Blood pressures were monitored on TID basis and controlled  Diabetes has been  monitored with ac/hs CBG checks and SSI was use prn for tighter BS control.    Rehab course: During patient's stay in rehab weekly team conferences were  held to monitor patient's progress, set goals and discuss barriers to discharge. At admission, patient required minimal assist 5 feet rolling walker minimal guard sit to stand  Physical exam.  Blood pressure 100/56 pulse 75 temperature 97.8 respirations 19 oxygen saturation 92% room air Constitutional.  No acute distress HEENT Head.  Normocephalic and atraumatic Eyes.  Pupils round and reactive to light no discharge without nystagmus Neck.  Supple nontender no JVD without thyromegaly Cardiac irregular irregular Abdomen.  Obese soft ostomy in place Respiratory effort normal no respiratory distress without wheeze Musculoskeletal normal range of motion no rigidity Comments.  Upper extremities 5 -/5 proximally 5/5 distal upper extremities bilateral Lower extremities 4+/5 hip flexors otherwise 5 -/5 knee extension dorsiflexion plantarflexion bilaterally Neurologic.  Alert and oriented   He/She  has had improvement in activity tolerance, balance, postural control as well as ability to compensate for deficits. He/She has had improvement in functional use RUE/LUE  and RLE/LLE as well as improvement in awareness.  Working with energy conservation techniques.  Sessions focused on functional ability transfers dressing and generalized strengthening.  Patient transferred supine to sit edge of bed head of bed elevated supervision.  Transferred bed to wheelchair rolling walker contact-guard assist.  Wheelchair mobility supervision.  Ambulates 94 feet rolling walker contact-guard assist.  She can gather her belongings for activities day living homemaking.  She continues use 2 L of oxygen at baseline.  Full family teaching completed plan discharge to home       Disposition: Discharged to home    Diet: Soft  Special Instructions: No driving smoking or  alcohol  Routine colostomy care  Continue 2 L oxygen therapy as prior to admission  Apply moist saline gauze to abdominal wound covered with dry 4 x 4 and secure with ABD pad and mepipore tape.  Change twice daily  Medications at discharge 1.  Tylenol as needed 2.  Protonix 40 mg daily 3.  Eliquis 5 mg p.o. twice daily 4.  Lipitor 80 mg p.o. daily 5.  Vitamin D 5000 units daily 6.  Enablex 7.5 mg daily 7.  Cardizem CD 240 mg daily 8.  Lasix 40 mg p.o. twice daily 9.  Levemir 18 units daily 10.  Synthroid 175 mg p.o. daily 11.Myrbetriq 50 mg daily 12.  Nitroglycerin as needed 13.  Oxycodone 5 to 10 mg every 8 hours as needed pain 14.  Prednisone 10 mg p.o. daily 15.  Zoloft 50 mg p.o. daily 16.  Trazodone 50 mg nightly as needed 17.  Ellipta 1 puff daily 18.  Vitamin B12 1000 mcg p.o. daily  30-35 minutes were spent completing discharge summary and discharge planning  Discharge Instructions     Ambulatory referral to Physical Medicine Rehab   Complete by: As directed    Moderate complexity follow-up 1 month debility/colostomy/multiple medical issues        Follow-up Information     Marcello FennelPatel, Concettina Leth Anil, MD Follow up.   Specialty: Physical Medicine and Rehabilitation Why: Office to call for appointment Contact information: [address] [place] [address] [phone]         Lucretia RoersBridges, Lindsay C, MD Follow up.   Specialty: General Surgery Why: Call for appointment Contact information: [address] [place] [facility] [id number] [phone]         Laqueta LindenKoneswaran, Suresh A, MD Follow up.   Specialty: Cardiology Why: Call for appointment                Signed: Reuel Boomaniel  J Angiulli 06/02/2020, 5:28 AM Patient was seen, face-face, and physical exam performed by me on day of discharge, greater than 30 minutes of total time spent.. Please see progress note from day of discharge as well.  Maryla Morrow, MD, ABPMR

## 2020-05-31 NOTE — Progress Notes (Signed)
Physical Therapy Session Note  Patient Details  Name: Ann [name] MRN: [id number] Date of Birth: [birth year]  Today's Date: 05/31/2020 PT Individual Time: [id number] and 1300-1413 PT Individual Time Calculation (min): 53 min and 73 min  Short Term Goals: Week 1:  PT Short Term Goal 1 (Week 1): STG=LTG due to LOS  Skilled Therapeutic Interventions/Progress Updates:   Treatment Session 1: [id number] 53 min  Received pt supine in bed, pt agreeable to therapy, and reported pain 6/10 in stomach where ostomy bag is. Pt reported rolling in bed last night to get changed and ostomy bag hitting on side of bedrail and leaking everywhere. RN provided pt with pain medication prior to session and pain improved throughout. Session with emphasis on functional mobility/transfers, toileting, dressing, generalized strengthening, dynamic standing balance/coordination, ambulation, and improved activity tolerance. Pt on 2L O2 via [place] with O2 sat 96% and HR 29bpm at rest; pt asymptomatic. Pt reported urge to use restroom and transferred supine<>sitting EOB with HOB elevated and use of bedrails with supervision. Donned shoes sitting EOB with set up assist. Pt transferred bed<>bedside commode with RW and CGA. Pt able to void and donned pull up underwear sitting on commode with supervision. Pt transferred sit<>stand with RW and supervision and required min A to pull underwear completely over hips due to body habitus and total A for peri-care. Returned to sitting on commode and doffed dirty gown and donned clean pull over dress with set up assist. Pt transferred bedside commode<>WC stand<>pivot with RW and close supervision. O2 sat 92% and HR 23bpm. Pt asymptomatic but RN notified and present to manually take HR; found to be 80bpm. Fluctuations in HR due to A-fib and pulse ox not accurrate. Pt sat in [facility] and brushed teeth, washed face, and did hair with set up assist and increased time due to pt getting phone calls from family. Pt  ambulated 49ft with RW and supervision. O2 sat 97% and HR 90bpm after activity. Concluded session with pt sitting in WC coloring with all needs within reach.    Treatment Session 2: [id number] 73 min Received pt sitting in [facility] with husband present at bedside. Pt/husband reported waiting >1 hr for volunteer to come and bring him downstairs. Therapist checked with Network engineer and then volunteer present to escort him downstairs. Pt agreeable to therapy, and reported pain 5/10 in stomach. RN notified to administer pain medication. Session with emphasis on functional mobility/transfers, generalized strengthening, dynamic standing balance/coordination, ambulation, and improved activity tolerance. Pt on 2L O2 via  with O2 sat >96%. NT present to assess vitals. BP 96/59 and asymptomatic. Donned ted hose total A and pt transported to dayroom in [facility] total A. Pt ambulated 20ft with RW and supervision with total A for O2 tank management. Pt transferred stand<>pivot WC<>mat with RW and supervision and worked on dynamic standing balance playing cornhole initially with RW fading to no UE support and CGA for balance x 2 trials. Pt insisted on standing with RW; attempted x1 rep without AD and min A but pt reported increased L knee pain and returned to sitting. Pt required encouragement to continue participation as pt stated "do I really have to do another round?" Educated pt on purposes of completing therapy sessions for strength and endurnace and pt performed the following exercises standing with BUE support and close supervision: -heel raises 2x10 -overhead shoulder press with 3lb dowel 2x8 -bicep curls with 3lb dowel 2x12 -seated hip flexion x10 bilaterally Pt transferred mat<>WC stand<>pivot with RW and supervision  and transported back to room in [facility] total A. Pt ambulated 6ft with RW and supervision to recliner. Concluded session with pt sitting in recliner, all needs within reach, and RN present administering medications.    Therapy Documentation Precautions:  Precautions Precautions: Fall Precaution Comments: dependent on supplemental O2; wound vac Restrictions Weight Bearing Restrictions: No  Therapy/Group: Individual Therapy Martin Majestic PT, DPT   05/31/2020, 7:24 AM

## 2020-05-31 NOTE — Progress Notes (Addendum)
[place] PHYSICAL MEDICINE & REHABILITATION PROGRESS NOTE  Subjective/Complaints: Patient seen laying in bed this AM.  She states she slept well overnight.  She does state that she bumped her colostomy against the side rail and that?  Broke open her wound with bleeding.  Evaluated colostomy, which appears intact.  Discussed with nursing-patient bumped her colostomy pouch which popped, but it was replaced with no further issues.  ROS: Denies CP, SOB, N/V/D  Objective: Vital Signs: Blood pressure 137/81, pulse (!) 56, temperature 98 F (36.7 C), temperature source Oral, resp. rate 17, height 5\' 3"  (1.6 m), weight 116.3 kg, SpO2 95 %. No results found. Recent Labs    05/31/20 0456  WBC 8.3  HGB 11.2*  HCT 38.1  PLT 439*   No results for input(s): NA, K, CL, CO2, GLUCOSE, BUN, CREATININE, CALCIUM in the last 72 hours.  Intake/Output Summary (Last 24 hours) at 05/31/2020 0922 Last data filed at 05/31/2020 07/29/2020 Gross per 24 hour  Intake 620 ml  Output 250 ml  Net 370 ml   Physical Exam: BP 137/81 (BP Location: Right Arm)   Pulse (!) 56   Temp 98 F (36.7 C) (Oral)   Resp 17   Ht 5\' 3"  (1.6 m)   Wt 116.3 kg   SpO2 95%   BMI 45.42 kg/m   Constitutional: No distress . Vital signs reviewed. HENT: Normocephalic.  Atraumatic. Eyes: EOMI. No discharge. Cardiovascular: No JVD.  Irregularly irregular. Respiratory: Normal effort.  No stridor.  Bilateral clear to auscultation.  + [place]. GI: Non-distended.  BS +.  + Colostomy intact. Skin: Warm and dry.  Abdomen with granulation tissue and increased fibrinous tissue. Psych: Normal mood.  Normal behavior. Musc: No edema in extremities.  No tenderness in extremities. Neuro: Alert Motor: Bilateral upper extremities: 5/5 proximal distal Bilateral lower extremities: 4-4+/5 proximal to distal, unchanged  Assessment/Plan: 1. Functional deficits which require 3+ hours per day of interdisciplinary therapy in a comprehensive inpatient rehab  setting.  Physiatrist is providing close team supervision and 24 hour management of active medical problems listed below.  Physiatrist and rehab team continue to assess barriers to discharge/monitor patient progress toward functional and medical goals   Care Tool:  Bathing    Body parts bathed by patient: Right arm,Left arm,Chest,Abdomen,Right upper leg,Left upper leg,Face   Body parts bathed by helper: Front perineal area,Buttocks,Right lower leg,Left lower leg     Bathing assist Assist Level: Moderate Assistance - Patient 50 - 74%     Upper Body Dressing/Undressing Upper body dressing   What is the patient wearing?: Dress    Upper body assist Assist Level: Supervision/Verbal cueing    Lower Body Dressing/Undressing Lower body dressing      What is the patient wearing?: Incontinence brief     Lower body assist Assist for lower body dressing: Contact Guard/Touching assist     Toileting Toileting    Toileting assist Assist for toileting: Minimal Assistance - Patient > 75%     Transfers Chair/bed transfer  Transfers assist     Chair/bed transfer assist level: Contact Guard/Touching assist     Locomotion Ambulation   Ambulation assist      Assist level: Contact Guard/Touching assist Assistive device: Walker-rolling Max distance: 62ft   Walk 10 feet activity   Assist     Assist level: Contact Guard/Touching assist Assistive device: Walker-rolling   Walk 50 feet activity   Assist Walk 50 feet with 2 turns activity did not occur: Safety/medical concerns (fatigue,  generalized weakness, decresaed balance)  Assist level: Contact Guard/Touching assist Assistive device: Walker-rolling    Walk 150 feet activity   Assist Walk 150 feet activity did not occur: Safety/medical concerns (fatigue, generalized weakness, decresaed balance)         Walk 10 feet on uneven surface  activity   Assist Walk 10 feet on uneven surfaces activity did not  occur: Safety/medical concerns (fatigue, generalized weakness, decresaed balance)         Wheelchair     Assist Will patient use wheelchair at discharge?: Yes Type of Wheelchair: Manual    Wheelchair assist level: Supervision/Verbal cueing Max wheelchair distance: 153ft    Wheelchair 50 feet with 2 turns activity    Assist        Assist Level: Supervision/Verbal cueing   Wheelchair 150 feet activity     Assist      Assist Level: Supervision/Verbal cueing    Medical Problem List and Plan: 1.  Debility secondary to sepsis due to sigmoid diverticulitis with contained perforation.  Status post colectomy colostomy placement 05/16/2020 per Dr. Blake Divine.WOC follow-up education ostomy.  Continue CIR  Team conference today to discuss current and goals and coordination of care, home and environmental barriers, and discharge planning with nursing, case manager, and therapies. Please see conference note from today as well.  2.  Antithrombotics: -DVT/anticoagulation: Eliquis             -antiplatelet therapy: N/A 3. Pain Management: Neurontin 400 mg 3 times daily oxycodone as needed  Tylenol with codeine 1 tab q4H prn for moderate pain  Improving after DC of wound VAC, controlled with meds on 1/5.   Moderate increased activity 4. Mood: Zoloft 50 mg daily             -antipsychotic agents: N/A 5. Neuropsych: This patient is capable of making decisions on her own behalf. 6. Skin/Wound Care: Routine skin checks with wound care nurse ostomy education  Wound VAC DC'd on 1/3 7. Fluids/Electrolytes/Nutrition: Strict ins and outs 8.  Acute blood loss anemia.    Hemoglobin 11.2 on 1/5  Continue to monitor 9. Steroid-induced hyperglycemia on diabetes mellitus with hyperglycemia.  Hemoglobin A1c 7.1.  Levemir 18 units daily.  Check blood sugars before meals and at bedtime  CBGs changed to ACHS   Relatively controlled on 1/5  Moderate increased mobility 10.  Atrial  fibrillation.  Cardizem 240 daily.  Continue Eliquis.  Cardiac rate controlled  Rate relatively controlled on 1/5  Monitor heart rate with increased activity. 11.  Adrenal insufficiency.  Continue prednisone chronically 10 mg daily 12. Chronic diastolic congestive heart failure.  Monitor for any signs of fluid overload.Lasix 40 mg BID Filed Weights   05/25/20 1229 05/25/20 1406 05/31/20 0440  Weight: 126.5 kg 126.5 kg 116.3 kg   ?  Reliability on 1/5 13.  COPD.  Continue inhalers as directed  Continue supplemental oxygen, 2 L at baseline 14.  CAD with history of prior stenting.  No chest pain or increasing shortness of breath 15.  Morbid obesity.  BMI 49.68.  Dietary follow-up 16.  Hyperlipidemia.  Lipitor 17.  Hypothyroidism.  Synthroid 18.  Overactive bladder.  Enablex 7.5 mg daily,Myrbetriq 50 mg daily  Will use depends if needed 19. Hypoalbuminemia  Supplement initiated on 12/31    LOS: 6 days A FACE TO FACE EVALUATION WAS PERFORMED  Ann [name] Lorie Phenix 05/31/2020, 9:22 AM

## 2020-06-01 ENCOUNTER — Inpatient Hospital Stay (HOSPITAL_COMMUNITY): Payer: Medicare Other | Admitting: Occupational Therapy

## 2020-06-01 ENCOUNTER — Inpatient Hospital Stay (HOSPITAL_COMMUNITY): Payer: Medicare Other

## 2020-06-01 LAB — GLUCOSE, CAPILLARY
Glucose-Capillary: 117 mg/dL — ABNORMAL HIGH (ref 70–99)
Glucose-Capillary: 167 mg/dL — ABNORMAL HIGH (ref 70–99)
Glucose-Capillary: 135 mg/dL — ABNORMAL HIGH (ref 70–99)
Glucose-Capillary: 201 mg/dL — ABNORMAL HIGH (ref 70–99)

## 2020-06-01 NOTE — Progress Notes (Signed)
Physical Therapy Session Note  Patient Details  Name: Ann [name] MRN: [id number] Date of Birth: [birth year]  Today's Date: 06/01/2020 PT Individual Time: [id number] and [id number] PT Individual Time Calculation (min): 57 min and 68 min  Short Term Goals: Week 1:  PT Short Term Goal 1 (Week 1): STG=LTG due to LOS  Skilled Therapeutic Interventions/Progress Updates:   Treatment Session 1: [id number] 57 min Received pt supine in bed, pt agreeable to therapy, and reported pain 5/10 in stomach (premedicated). Repositioning and rest breaks done to reduce pain levels. Session with emphasis on discharge planning, functional mobility/transfers, generalized strengthening, dynamic standing balance/coordination, ambulation, stair navigation, simulated car transfers, and improved activity tolerance. Pt on 2L O2 via [place] with O2 sat >95%. Pt reported urge to use restroom and transferred supine<>sitting EOB mod I using bedrails and transferred bed<>bedside commode with RW and supervision. Pt able to manage clothing with supervision and void but required total A for peri-care due to body habitus. Doffed dirty gown and donned clean pull over dress and shoes with set up assist. Pt transferred bedside commode<>WC with RW and supervision. O2 sat 98% and HR 99bpm. Pt able to stand and pick up deodorant from floor with RW and CGA with no LOB noted. Pt transported to ortho gym in [facility] total A for time management purposes and ambulated 46ft on uneven surfaces (ramp) with RW and supervision and performed ambulatory simulated car transfer with RW and supervision. Pt transported to therapy gym and navigated 4 steps with 2 rails and CGA ascending and descending with a step to pattern. Pt with SOB after activity and required extensive rest break; O2 sat 97%. Pt transported to dayroom and performed seated LE strengthening on Kinetron at 20 cm/sec for 1 minute x 3 trials with emphasis on glute/quad strengthening. Attempted to engage in  core strengthening cueing pt to lean forward, however pt reported increased discomfort at site of ostomy bag. Pt transported back to room in [facility] total A. Concluded session with pt sitting in [facility] with all needs within reach. Therapist provided fresh ice for pt.   Treatment Session 2: [id number] min Received pt sitting in recliner, pt agreeable to therapy, and reported pain 1/10 in buttocks from sitting. Session with emphasis on functional mobility/transfers, generalized strengthening, dynamic standing balance/coordination, ambulation, and improved activity tolerance. Pt on 2L O2 via [place] with O2 sat 97% at rest. Pt transferred recliner<>WC stand<>pivot with RW and supervision. Pt performed WC mobility 162ft mod I using BUE to dayroom. Pt requested to be weighted again and stepped on/off step 3 times to set scale with CGA. Pt ambulated 123ft with RW and close supervision with total A for O2 tank management. O2 97% after ambulation. Pt performed the following exercises with supervision and verbal cues for technique: -seated trunk rotation with 4.4lb medicine ball 2x10 bilaterally  -standing mini-squats x8 -standing alternating marches x20 -standing hip abduction x11 on RLE and x10 on LLE -bicep curls with 5lb dowel 2x10 Pt required multiple rest/water breaks throughout session due to fatigue. Pt reported urge to use restroom and was transported back to room in [facility] total A. Pt transferred WC<>bedside commode with RW and supervision and able to manage clothing with supervision and void. Pt required total A for peri-care in standing due to body habitus and transferred bedside commode<>WC with RW and supervision. Concluded session with pt sitting in [facility] with all needs within reach.   Therapy Documentation Precautions:  Precautions Precautions: Fall Precaution Comments: dependent on supplemental  O2; wound vac Restrictions Weight Bearing Restrictions: No  Therapy/Group: Individual Therapy Martin Majestic PT, DPT   06/01/2020, 7:25 AM

## 2020-06-01 NOTE — Progress Notes (Signed)
Ostomy teaching complete. Pt was able to empty ostomy correctly independently. Handout given. No further questions.  Mylo Red, LPN

## 2020-06-01 NOTE — Discharge Instructions (Signed)
Inpatient Rehab Discharge Instructions  JAIMY KLIETHERMES Discharge date and time: No discharge date for patient encounter.   Activities/Precautions/ Functional Status: Activity: activity as tolerated Diet: soft with diabetic restrictions Wound Care: Routine colostomy care Functional status:  ___ No restrictions     ___ Walk up steps independently ___ 24/7 supervision/assistance   ___ Walk up steps with assistance ___ Intermittent supervision/assistance  ___ Bathe/dress independently ___ Walk with walker     _x__ Bathe/dress with assistance ___ Walk Independently    ___ Shower independently ___ Walk with assistance    ___ Shower with assistance ___ No alcohol     ___ Return to work/school ________  Special Instructions: No driving smoking or alcohol  Continue 2 L oxygen therapy as prior to admission   Apply moist saline gauze to abdominal wound and cover with dry 4 x 4 and secure with ABD pad and meipore tape change twice daily    COMMUNITY REFERRALS UPON DISCHARGE:    Home Health:   PT, OT, RN                   Agency:[facility] [phone]   Medical Equipment/Items Ordered:HAS ALL EQUIPMENT FROM PAST ADMISSIONS                                                   My questions have been answered and I understand these instructions. I will adhere to these goals and the provided educational materials after my discharge from the hospital.  Patient/Caregiver Signature _______________________________ Date __________  Clinician Signature _______________________________________ Date __________  Please bring this form and your medication list with you to all your follow-up doctor's appointments.

## 2020-06-01 NOTE — Progress Notes (Signed)
Inpatient Rehabilitation Care Coordinator Discharge Note  The overall goal for the admission was met for:   Discharge location: [place] [facility]ERS-24/7 SUPERVISION  Length of Stay: Yes-8 DAYS  Discharge activity level: Yes-SUPERVISION  Home/community participation: Yes  Services provided included: MD, RD, PT, OT, RN, CM, Pharmacy and SW  Financial Services: Medicare and Private Insurance: CHAMPUS  Choices offered to/list presented to:YES  Follow-up services arranged: Home Health: [facility] and Patient/Family request agency HH: [facility], DME: NO NEEDS HAS ALL FROM PREVIOUS ADMITS  Comments (or additional information):SON AND HUSBAND [place]. ALL COMFORTABLE WITH CARE AND READY FOR DC  Patient/Family verbalized understanding of follow-up arrangements: Yes  Individual responsible for coordination of the follow-up plan: CARL-HUSBAND [phone]  Confirmed correct DME delivered: Elease Hashimoto 06/01/2020    Chyan Carnero, [place]

## 2020-06-01 NOTE — Progress Notes (Signed)
Occupational Therapy Session Note  Patient Details  Name: Ann [name] MRN: [id number] Date of Birth: February 14, 1946  Today's Date: 06/01/2020 OT Individual Time: 1000-1100 OT Individual Time Calculation (min): 60 min    Short Term Goals: Week 1:  OT Short Term Goal 1 (Week 1): STG = LTGs due to estimated LOS  Skilled Therapeutic Interventions/Progress Updates:    Patient seated in w/c, alert and ready for therapy session.  On 2L O2 via [place] t/o session.  She declines adl tasks at this time and reports that she was able to wash with min A, donn [place] dress with set up,  She requires assistance to donn teds but is able to donn slipper socks with sock aide.  Reviewed options to purchase sock aide - she demonstrates good understanding.  Reviewed strategies for basic home management with RW - she is able to verbalize and demonstrate good carryover.  Completed standing and seated arm/trunk activities to improve conditioning.  SPT with RW and short distance ambulation with supervision to/from w/c and recliner.  She remained seated in recliner at close of session, call bell and tray table in reach.    Therapy Documentation Precautions:  Precautions Precautions: Fall Precaution Comments: dependent on supplemental O2; wound vac Restrictions Weight Bearing Restrictions: No  Therapy/Group: Individual Therapy  Barrie Lyme 06/01/2020, 7:35 AM

## 2020-06-01 NOTE — Progress Notes (Signed)
Physical Therapy Discharge Summary  Patient Details  Name: Ann [name] MRN: [id number] Date of Birth: July 10, 1945  Patient has met 9 of 9 long term goals due to improved activity tolerance, improved balance, improved postural control, increased strength, decreased pain and improved coordination. Patient to discharge at a wheelchair level Supervision.  Pt's family did not attend family education training, however pt reports that 8 people live in her home and someone will available to provide 24/7 supervision upon D/C. Pt with no further questions regarding mobility.   All goals met  Recommendation:  Patient will benefit from ongoing skilled PT services in home health setting to continue to advance safe functional mobility, address ongoing impairments in transfers, generalized strengthening, dynamic standing balance/coordination, ambulation, endurance, and to minimize fall risk.  Equipment: No equipment provided  Reasons for discharge: treatment goals met  Patient/family agrees with progress made and goals achieved: Yes  PT Discharge Precautions/Restrictions Precautions Precautions: Fall Precaution Comments: 2L O2 via [place] Restrictions Weight Bearing Restrictions: No Cognition Overall Cognitive Status: Within Functional Limits for tasks assessed Arousal/Alertness: Awake/alert Orientation Level: Oriented X4 Memory: Appears intact Awareness: Appears intact Problem Solving: Appears intact Safety/Judgment: Appears intact Sensation Sensation Light Touch: Appears Intact Proprioception: Appears Intact Coordination Gross Motor Movements are Fluid and Coordinated: No Fine Motor Movements are Fluid and Coordinated: Yes Coordination and Movement Description: mild uncoordination due to body habitus, generalized weakness, and poor endurance. Finger Nose Finger Test: [employer] bilaterally Heel Shin Test: decreased ROM bilaterally Motor  Motor Motor: Abnormal postural alignment and  control Motor - Skilled Clinical Observations: mild uncoordination due to body habitus, generalized weakness, and poor endurance to activity.  Mobility Bed Mobility Bed Mobility: Rolling Right;Rolling Left;Sit to Supine;Supine to Sit Rolling Right: Independent with assistive device (bedrails) Rolling Left: Independent with assistive device (bedrails) Supine to Sit: Independent with assistive device (bedrails) Sit to Supine: Independent with assistive device (bedrails) Transfers Transfers: Sit to Stand;Stand to Sit;Stand Pivot Transfers Sit to Stand: Supervision/Verbal cueing Stand to Sit: Supervision/Verbal cueing Stand Pivot Transfers: Supervision/Verbal cueing Stand Pivot Transfer Details: Verbal cues for safe use of DME/AE Stand Pivot Transfer Details (indicate cue type and reason): verbal cues for O2 line management Transfer (Assistive device): Rolling walker Locomotion  Gait Ambulation: Yes Gait Assistance: Supervision/Verbal cueing Gait Distance (Feet): 100 Feet Assistive device: Rolling walker Gait Assistance Details: Verbal cues for gait pattern Gait Assistance Details: verbal cues for upright posture/gaze Gait Gait: Yes Gait Pattern: Impaired Gait Pattern: Step-to pattern;Decreased trunk rotation;Wide base of support;Decreased stride length;Decreased step length - right;Decreased step length - left;Trunk flexed;Poor foot clearance - left;Poor foot clearance - right Gait velocity: decreased Stairs / Additional Locomotion Stairs: Yes Stairs Assistance: Contact Guard/Touching assist Stair Management Technique: Two rails Number of Stairs: 4 Height of Stairs: 6 Ramp: Supervision/Verbal cueing (RW) Architect: Yes Wheelchair Assistance: Independent with Camera operator: Both upper extremities Wheelchair Parts Management: Needs assistance Distance: 163f  Trunk/Postural Assessment  Cervical Assessment Cervical  Assessment: Exceptions to [facility](forward head) Thoracic Assessment Thoracic Assessment: Exceptions to [facility](mild kyphosis) Lumbar Assessment Lumbar Assessment: Exceptions to [facility](posterior pelvic tilt) Postural Control Postural Control: Deficits on evaluation  Balance Balance Balance Assessed: Yes Static Sitting Balance Static Sitting - Balance Support: Feet supported;No upper extremity supported Static Sitting - Level of Assistance: 7: Independent Dynamic Sitting Balance Dynamic Sitting - Balance Support: Feet supported;No upper extremity supported Dynamic Sitting - Level of Assistance: 6: Modified independent (Device/Increase time) (increased time) STheatre stage manager  Standing - Balance Support: Bilateral upper extremity supported (RW) Static Standing - Level of Assistance: 5: Stand by assistance (supervision) Dynamic Standing Balance Dynamic Standing - Balance Support: Bilateral upper extremity supported (RW) Dynamic Standing - Level of Assistance: 5: Stand by assistance (supervision) Extremity Assessment  RLE Assessment RLE Assessment: Exceptions to [facility] General Strength Comments: grossly generalized to 4/5 LLE Assessment LLE Assessment: Exceptions to [facility] General Strength Comments: grossly generalized to 4/5  Alfonse Alpers PT, DPT  06/01/2020, 7:48 AM

## 2020-06-01 NOTE — Progress Notes (Signed)
Occupational Therapy Discharge Summary  Patient Details  Name: Ann [name] MRN: [id number] Date of Birth: Apr 23, 1946   Patient has met 8 of 8 long term goals due to improved activity tolerance, improved balance, postural control and ability to compensate for deficits.  Patient to discharge at overall Supervision level, Min assist for bathing and LB dressing as per PLOF.  Patient's care partner is independent to provide the necessary physical assistance at discharge.  Patient lives with multiple family members and has 24/7 supervision.  Pt required assistance with hygiene occasionally PTA and will return requiring intermittent assistance with LB bathing and donning sock/shoes.  Reasons goals not met: N/A  Recommendation:  Patient will benefit from ongoing skilled OT services in home health setting to continue to advance functional skills in the area of BADL and Reduce care partner burden.  Equipment: No equipment provided  Reasons for discharge: treatment goals met and discharge from hospital  Patient/family agrees with progress made and goals achieved: Yes  OT Discharge Precautions/Restrictions  Precautions Precautions: Fall Precaution Comments: 2L O2 via [employer]   Vital Signs Therapy Vitals Temp: (!) 97.2 F (36.2 C) Pulse Rate: 95 Resp: 19 BP: 111/71 Patient Position (if appropriate): Sitting Oxygen Therapy SpO2: 95 % O2 Device: Nasal Cannula O2 Flow Rate (L/min): 2 L/min Pain Pain Assessment Pain Scale: 0-10 Pain Score: 1  Pain Location: Abdomen Pain Orientation: Lower Pain Intervention(s): Medication (See eMAR) ADL ADL Equipment Provided: Sock aid,Long-handled sponge Eating: Independent Where Assessed-Eating: Wheelchair Grooming: Setup Where Assessed-Grooming: Sitting at sink Upper Body Bathing: Supervision/safety,Setup Where Assessed-Upper Body Bathing: Wheelchair Lower Body Bathing: Minimal assistance Where Assessed-Lower Body Bathing:  Wheelchair Upper Body Dressing: Setup Where Assessed-Upper Body Dressing: Wheelchair Lower Body Dressing: Minimal assistance Where Assessed-Lower Body Dressing: Wheelchair Toileting: Supervision/safety Where Assessed-Toileting: Bedside Commode Toilet Transfer: Close supervision Toilet Transfer Method: Counselling psychologist: Extra wide Geophysical data processor: Close supervision Social research officer, government Method: Ambulating Vision Baseline Vision/History: Wears glasses (reports going blind in R eye) Wears Glasses: At all times Patient Visual Report: No change from baseline Vision Assessment?: No apparent visual deficits Perception  Perception: Within Functional Limits Praxis Praxis: Intact Cognition Overall Cognitive Status: Within Functional Limits for tasks assessed Arousal/Alertness: Awake/alert Attention: Selective Selective Attention: Appears intact Memory: Appears intact Awareness: Appears intact Problem Solving: Appears intact Safety/Judgment: Appears intact Sensation Sensation Light Touch: Appears Intact Proprioception: Appears Intact Coordination Gross Motor Movements are Fluid and Coordinated: No Fine Motor Movements are Fluid and Coordinated: Yes Coordination and Movement Description: mild uncoordination due to body habitus, generalized weakness, and poor endurance. Finger Nose Finger Test: [facility] bilaterally Heel Shin Test: decreased ROM bilaterally Motor  Motor Motor: Abnormal postural alignment and control Motor - Skilled Clinical Observations: mild uncoordination due to body habitus, generalized weakness, and poor endurance to activity. Mobility  Bed Mobility Bed Mobility: Rolling Right;Rolling Left;Sit to Supine;Supine to Sit Rolling Right: Independent with assistive device (bedrails) Rolling Left: Independent with assistive device (bedrails) Supine to Sit: Independent with assistive device (bedrails) Sit to Supine: Independent  with assistive device (bedrails) Transfers Sit to Stand: Supervision/Verbal cueing Stand to Sit: Supervision/Verbal cueing  Trunk/Postural Assessment  Cervical Assessment Cervical Assessment: Exceptions to [facility] (forward head) Thoracic Assessment Thoracic Assessment: Exceptions to [facility] (mild kyphosis) Lumbar Assessment Lumbar Assessment: Exceptions to [facility] (posterior pelvic tilt) Postural Control Postural Control: Deficits on evaluation  Balance Balance Balance Assessed: Yes Static Sitting Balance Static Sitting - Balance Support: Feet supported;No upper extremity supported Static Sitting - Level  of Assistance: 7: Independent Dynamic Sitting Balance Dynamic Sitting - Balance Support: Feet supported;No upper extremity supported Dynamic Sitting - Level of Assistance: 6: Modified independent (Device/Increase time) (increased time) Static Standing Balance Static Standing - Balance Support: Bilateral upper extremity supported (RW) Static Standing - Level of Assistance: 5: Stand by assistance (supervision) Dynamic Standing Balance Dynamic Standing - Balance Support: Bilateral upper extremity supported (RW) Dynamic Standing - Level of Assistance: 5: Stand by assistance (supervision) Extremity/Trunk Assessment RUE Assessment RUE Assessment: Within Functional Limits LUE Assessment LUE Assessment: Within Functional Limits   Ann [name], [facility] 06/01/2020, 7:57 PM

## 2020-06-01 NOTE — Progress Notes (Signed)
[place] PHYSICAL MEDICINE & REHABILITATION PROGRESS NOTE  Subjective/Complaints: Patient seen sitting up in bed this morning.  She states she slept fairly overnight due to bumping her colostomy.  ROS: Denies CP, SOB, N/V/D  Objective: Vital Signs: Blood pressure 128/61, pulse 99, temperature 98.3 F (36.8 C), resp. rate 15, height 5\' 3"  (1.6 m), weight 116.3 kg, SpO2 95 %. No results found. Recent Labs    05/31/20 0456  WBC 8.3  HGB 11.2*  HCT 38.1  PLT 439*   No results for input(s): NA, K, CL, CO2, GLUCOSE, BUN, CREATININE, CALCIUM in the last 72 hours.  Intake/Output Summary (Last 24 hours) at 06/01/2020 0852 Last data filed at 05/31/2020 2148 Gross per 24 hour  Intake 240 ml  Output 325 ml  Net -85 ml   Physical Exam: BP 128/61 (BP Location: Right Arm)   Pulse 99   Temp 98.3 F (36.8 C)   Resp 15   Ht 5\' 3"  (1.6 m)   Wt 116.3 kg   SpO2 95%   BMI 45.42 kg/m   Constitutional: No distress . Vital signs reviewed. HENT: Normocephalic.  Atraumatic. Eyes: EOMI. No discharge. Cardiovascular: No JVD.  Irregularly irregular. Respiratory: Normal effort.  No stridor.  Bilateral clear to auscultation.  + [place]. GI: Non-distended.  BS +.  Colostomy intact. Skin: Warm and dry.  Abdomen with granulation tissue and fibrinous exudate. Psych: Normal mood.  Normal behavior. Musc: No edema in extremities.  No tenderness in extremities. Neuro: Alert Motor: Bilateral upper extremities: 5/5 proximal distal Bilateral lower extremities: 4-4+/5 proximal to distal, stable  Assessment/Plan: 1. Functional deficits which require 3+ hours per day of interdisciplinary therapy in a comprehensive inpatient rehab setting.  Physiatrist is providing close team supervision and 24 hour management of active medical problems listed below.  Physiatrist and rehab team continue to assess barriers to discharge/monitor patient progress toward functional and medical goals   Care Tool:  Bathing     Body parts bathed by patient: Right arm,Left arm,Chest,Abdomen,Right upper leg,Left upper leg,Face   Body parts bathed by helper: Front perineal area,Buttocks,Right lower leg,Left lower leg     Bathing assist Assist Level: Moderate Assistance - Patient 50 - 74%     Upper Body Dressing/Undressing Upper body dressing   What is the patient wearing?: Dress    Upper body assist Assist Level: Supervision/Verbal cueing    Lower Body Dressing/Undressing Lower body dressing      What is the patient wearing?: Incontinence brief     Lower body assist Assist for lower body dressing: Contact Guard/Touching assist     Toileting Toileting    Toileting assist Assist for toileting: Minimal Assistance - Patient > 75%     Transfers Chair/bed transfer  Transfers assist     Chair/bed transfer assist level: Contact Guard/Touching assist     Locomotion Ambulation   Ambulation assist      Assist level: Supervision/Verbal cueing Assistive device: Walker-rolling Max distance: 48ft   Walk 10 feet activity   Assist     Assist level: Supervision/Verbal cueing Assistive device: Walker-rolling   Walk 50 feet activity   Assist Walk 50 feet with 2 turns activity did not occur: Safety/medical concerns (fatigue, generalized weakness, decresaed balance)  Assist level: Contact Guard/Touching assist Assistive device: Walker-rolling    Walk 150 feet activity   Assist Walk 150 feet activity did not occur: Safety/medical concerns (fatigue, generalized weakness, decresaed balance)         Walk 10 feet on uneven  surface  activity   Assist Walk 10 feet on uneven surfaces activity did not occur: Safety/medical concerns (fatigue, generalized weakness, decresaed balance)         Wheelchair     Assist Will patient use wheelchair at discharge?: Yes Type of Wheelchair: Manual    Wheelchair assist level: Supervision/Verbal cueing Max wheelchair distance: 126ft     Wheelchair 50 feet with 2 turns activity    Assist        Assist Level: Supervision/Verbal cueing   Wheelchair 150 feet activity     Assist      Assist Level: Supervision/Verbal cueing    Medical Problem List and Plan: 1.  Debility secondary to sepsis due to sigmoid diverticulitis with contained perforation.  Status post colectomy colostomy placement 05/16/2020 per Dr. Blake Divine.WOC follow-up education ostomy.  Continue CIR 2.  Antithrombotics: -DVT/anticoagulation: Eliquis             -antiplatelet therapy: N/A 3. Pain Management: Neurontin 400 mg 3 times daily oxycodone as needed  Tylenol with codeine 1 tab q4H prn for moderate pain  Improving after DC of wound VAC, controlled with meds on 1/6.   Moderate increased activity 4. Mood: Zoloft 50 mg daily             -antipsychotic agents: N/A 5. Neuropsych: This patient is capable of making decisions on her own behalf. 6. Skin/Wound Care: Routine skin checks with wound care nurse ostomy education  Wound VAC DC'd on 1/3 7. Fluids/Electrolytes/Nutrition: Strict ins and outs 8.  Acute blood loss anemia.    Hemoglobin 11.2 on 1/5  Continue to monitor 9. Steroid-induced hyperglycemia on diabetes mellitus with hyperglycemia.  Hemoglobin A1c 7.1.  Levemir 18 units daily.  Check blood sugars before meals and at bedtime  CBGs changed to ACHS   Relatively controlled on 1/6  Moderate increased mobility 10.  Atrial fibrillation.  Cardizem 240 daily.  Continue Eliquis.  Cardiac rate controlled  Rate relatively controlled on 1/6  Monitor heart rate with increased activity. 11.  Adrenal insufficiency.  Continue prednisone chronically 10 mg daily 12. Chronic diastolic congestive heart failure.  Monitor for any signs of fluid overload.Lasix 40 mg BID Filed Weights   05/25/20 1229 05/25/20 1406 05/31/20 0440  Weight: 126.5 kg 126.5 kg 116.3 kg   ?  Reliability on 1/5 13.  COPD.  Continue inhalers as directed  Continue  supplemental oxygen, 2 L at baseline 14.  CAD with history of prior stenting.  No chest pain or increasing shortness of breath 15.  Morbid obesity.  BMI 49.68.  Dietary follow-up 16.  Hyperlipidemia.  Lipitor 17.  Hypothyroidism.  Synthroid 18.  Overactive bladder.  Enablex 7.5 mg daily,Myrbetriq 50 mg daily  Will use depends if needed 19. Hypoalbuminemia  Supplement initiated on 12/31    LOS: 7 days A FACE TO FACE EVALUATION WAS PERFORMED  Susen Haskew Lorie Phenix 06/01/2020, 8:52 AM

## 2020-06-01 NOTE — Progress Notes (Signed)
Daughter visiting in room without mask covering nose and mouth. Reminded and educated daughter on policy. Daughter compliant.  Mylo Red, LPN

## 2020-06-02 LAB — GLUCOSE, CAPILLARY: Glucose-Capillary: 125 mg/dL — ABNORMAL HIGH (ref 70–99)

## 2020-06-02 NOTE — Consult Note (Addendum)
Signed        WOC Nurse ostomy follow up Patient receiving care in [facility] 4W01 Stoma type/location:Budded LUQ Stomal assessment/size:Moist pink purple 1 3/4" Peristomal assessment:Starting some mucocutaneous separation. Noted some bleeding when cleaning around the stoma Treatment options for stomal/peristomal skin:Cleaned the area with plain water. Let dry. Coated the area with no sting barrier film. May need to crust at next pouch change Output: Thin green/brown Ostomy pouching: 2pc.2 3/4" Kellie Simmering # [phone]) Skin barrier Kellie Simmering # 2) Skin barrier rings Kellie Simmering # [id number]) Education provided: Pandora Leiter Shanon Brow) was present for teaching and pouch change. He was able to the complete pouch change without any assistance, except cutting the skin barrier to size (only because he did not have his glasses with him). Shanon Brow also completed the abdominal wound dressing change without assistance and will take care of this for his mom at home. HH is scheduled and will assist patient with supplies at home. Patient will take home remaining supplies that are in the room. Spouse present and states that the patient has received the [place] SS pack at home. Patient is discharging today.    Cathlean Marseilles Tamala Julian, MSN, RN, [place], Lysle Pearl, [facility] Wound Treatment Associate Pager [phone]

## 2020-06-02 NOTE — Progress Notes (Incomplete)
PA Dan in to discuss discharge instructions. Pt and family in agreement. Pt and family educated on incision dressing change. Family able to return demonstration. WOC in to do ostomy teaching with family. Belongings gathered. Pt left per wheelchair to private vehicle with family. No complications noted.  Sheela Stack, LPN

## 2020-06-02 NOTE — Progress Notes (Signed)
[place] PHYSICAL MEDICINE & REHABILITATION PROGRESS NOTE  Subjective/Complaints: Patient seen sitting up in bed this morning.  She states she slept well overnight.  She is ready for discharge.  She is appreciative of her care.  ROS: Denies CP, SOB, N/V/D  Objective: Vital Signs: Blood pressure 137/79, pulse (!) 55, temperature 97.7 F (36.5 C), resp. rate 18, height 5\' 3"  (1.6 m), weight 116.3 kg, SpO2 97 %. No results found. Recent Labs    05/31/20 0456  WBC 8.3  HGB 11.2*  HCT 38.1  PLT 439*   No results for input(s): NA, K, CL, CO2, GLUCOSE, BUN, CREATININE, CALCIUM in the last 72 hours.  Intake/Output Summary (Last 24 hours) at 06/02/2020 0843 Last data filed at 06/01/2020 1844 Gross per 24 hour  Intake 480 ml  Output -  Net 480 ml   Physical Exam: BP 137/79 (BP Location: Right Arm)   Pulse (!) 55   Temp 97.7 F (36.5 C)   Resp 18   Ht 5\' 3"  (1.6 m)   Wt 116.3 kg   SpO2 97%   BMI 45.42 kg/m   Constitutional: No distress . Vital signs reviewed. HENT: Normocephalic.  Atraumatic. Eyes: EOMI. No discharge. Cardiovascular: No JVD.  Irregularly irregular.  Respiratory: Normal effort.  No stridor.  Bilateral clear to auscultation. + [place]. GI: Non-distended.  BS +. + Colostomy intact. Skin: Warm and dry.  Abdomen with granulation tissue and decreasing fibrinous exudate. Psych: Normal mood.  Normal behavior. Musc: No edema in extremities.  No tenderness in extremities. Neuro: Alert Motor: Bilateral upper extremities: 5/5 proximal distal Bilateral lower extremities: 4-4+/5 proximal to distal, unchanged  Assessment/Plan: 1. Functional deficits which require 3+ hours per day of interdisciplinary therapy in a comprehensive inpatient rehab setting.  Physiatrist is providing close team supervision and 24 hour management of active medical problems listed below.  Physiatrist and rehab team continue to assess barriers to discharge/monitor patient progress toward functional  and medical goals   Care Tool:  Bathing    Body parts bathed by patient: Right arm,Left arm,Chest,Abdomen,Right upper leg,Left upper leg,Face,Front perineal area,Buttocks   Body parts bathed by helper: Right lower leg,Left lower leg     Bathing assist Assist Level: Minimal Assistance - Patient > 75%     Upper Body Dressing/Undressing Upper body dressing   What is the patient wearing?: Dress    Upper body assist Assist Level: Set up assist    Lower Body Dressing/Undressing Lower body dressing      What is the patient wearing?: Incontinence brief     Lower body assist Assist for lower body dressing: Set up assist     Toileting Toileting    Toileting assist Assist for toileting: Supervision/Verbal cueing     Transfers Chair/bed transfer  Transfers assist     Chair/bed transfer assist level: Supervision/Verbal cueing     Locomotion Ambulation   Ambulation assist      Assist level: Supervision/Verbal cueing Assistive device: Walker-rolling Max distance: 175ft   Walk 10 feet activity   Assist     Assist level: Supervision/Verbal cueing Assistive device: Walker-rolling   Walk 50 feet activity   Assist Walk 50 feet with 2 turns activity did not occur: Safety/medical concerns (fatigue, generalized weakness, decresaed balance)  Assist level: Supervision/Verbal cueing Assistive device: Walker-rolling    Walk 150 feet activity   Assist Walk 150 feet activity did not occur: Safety/medical concerns (generalized weakness, decreased endurance)         Walk 10  feet on uneven surface  activity   Assist Walk 10 feet on uneven surfaces activity did not occur: Safety/medical concerns (fatigue, generalized weakness, decresaed balance)   Assist level: Supervision/Verbal cueing Assistive device: Aeronautical engineer Will patient use wheelchair at discharge?: Yes Type of Wheelchair: Manual    Wheelchair assist level:  Independent Max wheelchair distance: 165ft    Wheelchair 50 feet with 2 turns activity    Assist        Assist Level: Independent   Wheelchair 150 feet activity     Assist      Assist Level: Supervision/Verbal cueing    Medical Problem List and Plan: 1.  Debility secondary to sepsis due to sigmoid diverticulitis with contained perforation.  Status post colectomy colostomy placement 05/16/2020 per Dr. Blake Divine.WOC follow-up education ostomy.  DC today  Will see patient for hospital follow-up in 1 month post-discharge 2.  Antithrombotics: -DVT/anticoagulation: Eliquis             -antiplatelet therapy: N/A 3. Pain Management: Neurontin 400 mg 3 times daily oxycodone as needed  Tylenol with codeine 1 tab q4H prn for moderate pain  Improving after DC of wound VAC, controlled with meds on 1/7  Moderate increased activity 4. Mood: Zoloft 50 mg daily             -antipsychotic agents: N/A 5. Neuropsych: This patient is capable of making decisions on her own behalf. 6. Skin/Wound Care: Routine skin checks with wound care nurse ostomy education  Wound VAC DC'd on 1/3 7. Fluids/Electrolytes/Nutrition: Strict ins and outs 8.  Acute blood loss anemia.    Hemoglobin 11.2 on 1/5  Continue to monitor 9. Steroid-induced hyperglycemia on diabetes mellitus with hyperglycemia.  Hemoglobin A1c 7.1.  Levemir 18 units daily.  Check blood sugars before meals and at bedtime  CBGs changed to ACHS   Slightly labile on 1/7, monitor in ambulatory setting with potential further adjustments as necessary.  Moderate increased mobility 10.  Atrial fibrillation.  Cardizem 240 daily.  Continue Eliquis.  Cardiac rate controlled  Rate relatively controlled on 1/7  Monitor heart rate with increased activity. 11.  Adrenal insufficiency.  Continue prednisone chronically 10 mg daily 12. Chronic diastolic congestive heart failure.  Monitor for any signs of fluid overload.Lasix 40 mg BID Filed  Weights   05/25/20 1229 05/25/20 1406 05/31/20 0440  Weight: 126.5 kg 126.5 kg 116.3 kg   ?  Reliability on 1/5 13.  COPD.  Continue inhalers as directed  Continue supplemental oxygen, 2 L at baseline 14.  CAD with history of prior stenting.  No chest pain or increasing shortness of breath 15.  Morbid obesity.  BMI 49.68.  Dietary follow-up 16.  Hyperlipidemia.  Lipitor 17.  Hypothyroidism.  Synthroid 18.  Overactive bladder.  Enablex 7.5 mg daily,Myrbetriq 50 mg daily  Will use depends if needed 19. Hypoalbuminemia  Supplement initiated on 12/31  > 30 minutes spent in total in discharge planning between myself and PA regarding aforementioned, as well discussion regarding DME equipment, follow-up appointments, follow-up therapies, discharge medications, discharge recommendations, answering questions   LOS: 8 days A FACE TO FACE EVALUATION WAS PERFORMED  Kimika Streater Lorie Phenix 06/02/2020, 8:43 AM

## 2020-06-03 DIAGNOSIS — D62 Acute posthemorrhagic anemia: Secondary | ICD-10-CM | POA: Diagnosis not present

## 2020-06-03 DIAGNOSIS — N3281 Overactive bladder: Secondary | ICD-10-CM | POA: Diagnosis not present

## 2020-06-03 DIAGNOSIS — E662 Morbid (severe) obesity with alveolar hypoventilation: Secondary | ICD-10-CM | POA: Diagnosis not present

## 2020-06-03 DIAGNOSIS — J449 Chronic obstructive pulmonary disease, unspecified: Secondary | ICD-10-CM | POA: Diagnosis not present

## 2020-06-03 DIAGNOSIS — E1142 Type 2 diabetes mellitus with diabetic polyneuropathy: Secondary | ICD-10-CM | POA: Diagnosis not present

## 2020-06-03 DIAGNOSIS — Z6841 Body Mass Index (BMI) 40.0 and over, adult: Secondary | ICD-10-CM | POA: Diagnosis not present

## 2020-06-03 DIAGNOSIS — E46 Unspecified protein-calorie malnutrition: Secondary | ICD-10-CM | POA: Diagnosis not present

## 2020-06-03 DIAGNOSIS — I5032 Chronic diastolic (congestive) heart failure: Secondary | ICD-10-CM | POA: Diagnosis not present

## 2020-06-03 DIAGNOSIS — E2740 Unspecified adrenocortical insufficiency: Secondary | ICD-10-CM | POA: Diagnosis not present

## 2020-06-03 DIAGNOSIS — I4891 Unspecified atrial fibrillation: Secondary | ICD-10-CM | POA: Diagnosis not present

## 2020-06-03 DIAGNOSIS — K5721 Diverticulitis of large intestine with perforation and abscess with bleeding: Secondary | ICD-10-CM | POA: Diagnosis not present

## 2020-06-03 DIAGNOSIS — I2510 Atherosclerotic heart disease of native coronary artery without angina pectoris: Secondary | ICD-10-CM | POA: Diagnosis not present

## 2020-06-03 DIAGNOSIS — K219 Gastro-esophageal reflux disease without esophagitis: Secondary | ICD-10-CM | POA: Diagnosis not present

## 2020-06-03 DIAGNOSIS — Z9981 Dependence on supplemental oxygen: Secondary | ICD-10-CM | POA: Diagnosis not present

## 2020-06-03 DIAGNOSIS — T380X5D Adverse effect of glucocorticoids and synthetic analogues, subsequent encounter: Secondary | ICD-10-CM | POA: Diagnosis not present

## 2020-06-03 DIAGNOSIS — E039 Hypothyroidism, unspecified: Secondary | ICD-10-CM | POA: Diagnosis not present

## 2020-06-03 DIAGNOSIS — Z433 Encounter for attention to colostomy: Secondary | ICD-10-CM | POA: Diagnosis not present

## 2020-06-03 DIAGNOSIS — E1165 Type 2 diabetes mellitus with hyperglycemia: Secondary | ICD-10-CM | POA: Diagnosis not present

## 2020-06-03 DIAGNOSIS — E538 Deficiency of other specified B group vitamins: Secondary | ICD-10-CM | POA: Diagnosis not present

## 2020-06-03 DIAGNOSIS — Z79891 Long term (current) use of opiate analgesic: Secondary | ICD-10-CM | POA: Diagnosis not present

## 2020-06-03 DIAGNOSIS — Z48815 Encounter for surgical aftercare following surgery on the digestive system: Secondary | ICD-10-CM | POA: Diagnosis not present

## 2020-06-03 DIAGNOSIS — I110 Hypertensive heart disease with heart failure: Secondary | ICD-10-CM | POA: Diagnosis not present

## 2020-06-03 DIAGNOSIS — E785 Hyperlipidemia, unspecified: Secondary | ICD-10-CM | POA: Diagnosis not present

## 2020-06-03 DIAGNOSIS — Z4801 Encounter for change or removal of surgical wound dressing: Secondary | ICD-10-CM | POA: Diagnosis not present

## 2020-06-03 DIAGNOSIS — E8809 Other disorders of plasma-protein metabolism, not elsewhere classified: Secondary | ICD-10-CM | POA: Diagnosis not present

## 2020-06-06 ENCOUNTER — Other Ambulatory Visit: Payer: Self-pay

## 2020-06-06 ENCOUNTER — Emergency Department (HOSPITAL_COMMUNITY): Payer: Medicare Other

## 2020-06-06 ENCOUNTER — Encounter (HOSPITAL_COMMUNITY): Payer: Self-pay | Admitting: Emergency Medicine

## 2020-06-06 ENCOUNTER — Emergency Department (HOSPITAL_COMMUNITY)
Admission: EM | Admit: 2020-06-06 | Discharge: 2020-06-06 | Disposition: A | Discharge status: Home / Self Care | Payer: Medicare Other | Attending: Emergency Medicine | Admitting: Emergency Medicine

## 2020-06-06 DIAGNOSIS — E039 Hypothyroidism, unspecified: Secondary | ICD-10-CM | POA: Diagnosis not present

## 2020-06-06 DIAGNOSIS — Z79899 Other long term (current) drug therapy: Secondary | ICD-10-CM | POA: Diagnosis not present

## 2020-06-06 DIAGNOSIS — J449 Chronic obstructive pulmonary disease, unspecified: Secondary | ICD-10-CM | POA: Diagnosis not present

## 2020-06-06 DIAGNOSIS — N183 Chronic kidney disease, stage 3 unspecified: Secondary | ICD-10-CM | POA: Diagnosis not present

## 2020-06-06 DIAGNOSIS — D62 Acute posthemorrhagic anemia: Secondary | ICD-10-CM | POA: Diagnosis not present

## 2020-06-06 DIAGNOSIS — Z794 Long term (current) use of insulin: Secondary | ICD-10-CM | POA: Insufficient documentation

## 2020-06-06 DIAGNOSIS — Z87891 Personal history of nicotine dependence: Secondary | ICD-10-CM | POA: Diagnosis not present

## 2020-06-06 DIAGNOSIS — I480 Paroxysmal atrial fibrillation: Secondary | ICD-10-CM | POA: Diagnosis not present

## 2020-06-06 DIAGNOSIS — E1122 Type 2 diabetes mellitus with diabetic chronic kidney disease: Secondary | ICD-10-CM | POA: Insufficient documentation

## 2020-06-06 DIAGNOSIS — Z9889 Other specified postprocedural states: Secondary | ICD-10-CM | POA: Insufficient documentation

## 2020-06-06 DIAGNOSIS — E1165 Type 2 diabetes mellitus with hyperglycemia: Secondary | ICD-10-CM | POA: Diagnosis not present

## 2020-06-06 DIAGNOSIS — K9409 Other complications of colostomy: Secondary | ICD-10-CM | POA: Diagnosis not present

## 2020-06-06 DIAGNOSIS — I5033 Acute on chronic diastolic (congestive) heart failure: Secondary | ICD-10-CM | POA: Insufficient documentation

## 2020-06-06 DIAGNOSIS — K5721 Diverticulitis of large intestine with perforation and abscess with bleeding: Secondary | ICD-10-CM | POA: Diagnosis not present

## 2020-06-06 DIAGNOSIS — K9400 Colostomy complication, unspecified: Secondary | ICD-10-CM | POA: Diagnosis not present

## 2020-06-06 DIAGNOSIS — K5900 Constipation, unspecified: Secondary | ICD-10-CM | POA: Diagnosis not present

## 2020-06-06 DIAGNOSIS — I130 Hypertensive heart and chronic kidney disease with heart failure and stage 1 through stage 4 chronic kidney disease, or unspecified chronic kidney disease: Secondary | ICD-10-CM | POA: Diagnosis not present

## 2020-06-06 DIAGNOSIS — Z48815 Encounter for surgical aftercare following surgery on the digestive system: Secondary | ICD-10-CM | POA: Diagnosis not present

## 2020-06-06 DIAGNOSIS — R1032 Left lower quadrant pain: Secondary | ICD-10-CM | POA: Diagnosis present

## 2020-06-06 DIAGNOSIS — R109 Unspecified abdominal pain: Secondary | ICD-10-CM | POA: Diagnosis not present

## 2020-06-06 DIAGNOSIS — Z4801 Encounter for change or removal of surgical wound dressing: Secondary | ICD-10-CM | POA: Diagnosis not present

## 2020-06-06 DIAGNOSIS — Z433 Encounter for attention to colostomy: Secondary | ICD-10-CM | POA: Diagnosis not present

## 2020-06-06 LAB — CBC WITH DIFFERENTIAL/PLATELET
Abs Immature Granulocytes: 0.04 10*3/uL (ref 0.00–0.07)
Basophils Absolute: 0 10*3/uL (ref 0.0–0.1)
Basophils Relative: 1 %
Eosinophils Absolute: 0.1 10*3/uL (ref 0.0–0.5)
Eosinophils Relative: 1 %
HCT: 36.3 % (ref 36.0–46.0)
Hemoglobin: 10.4 g/dL — ABNORMAL LOW (ref 12.0–15.0)
Immature Granulocytes: 1 %
Lymphocytes Relative: 22 %
Lymphs Abs: 1.7 10*3/uL (ref 0.7–4.0)
MCH: 25.1 pg — ABNORMAL LOW (ref 26.0–34.0)
MCHC: 28.7 g/dL — ABNORMAL LOW (ref 30.0–36.0)
MCV: 87.7 fL (ref 80.0–100.0)
Monocytes Absolute: 0.6 10*3/uL (ref 0.1–1.0)
Monocytes Relative: 8 %
Neutro Abs: 5.2 10*3/uL (ref 1.7–7.7)
Neutrophils Relative %: 67 %
Platelets: 308 10*3/uL (ref 150–400)
RBC: 4.14 MIL/uL (ref 3.87–5.11)
RDW: 17.4 % — ABNORMAL HIGH (ref 11.5–15.5)
WBC: 7.7 10*3/uL (ref 4.0–10.5)
nRBC: 0 % (ref 0.0–0.2)

## 2020-06-06 LAB — COMPREHENSIVE METABOLIC PANEL
ALT: 18 U/L (ref 0–44)
AST: 18 U/L (ref 15–41)
Albumin: 3.5 g/dL (ref 3.5–5.0)
Alkaline Phosphatase: 84 U/L (ref 38–126)
Anion gap: 13 (ref 5–15)
BUN: 17 mg/dL (ref 8–23)
CO2: 27 mmol/L (ref 22–32)
Calcium: 8.6 mg/dL — ABNORMAL LOW (ref 8.9–10.3)
Chloride: 101 mmol/L (ref 98–111)
Creatinine, Ser: 0.76 mg/dL (ref 0.44–1.00)
GFR, Estimated: >60 mL/min (ref >60)
Glucose, Bld: 126 mg/dL — ABNORMAL HIGH (ref 70–99)
Potassium: 3.7 mmol/L (ref 3.5–5.1)
Sodium: 141 mmol/L (ref 135–145)
Total Bilirubin: 0.4 mg/dL (ref 0.3–1.2)
Total Protein: 7 g/dL (ref 6.5–8.1)

## 2020-06-06 MED ORDER — IOHEXOL 300 MG/ML  SOLN
100.0000 mL | Freq: Once | INTRAMUSCULAR | Status: AC | PRN
  Administered 2020-06-06: 100 mL via INTRAVENOUS

## 2020-06-06 NOTE — Progress Notes (Signed)
[facility] Progress Note     Subjective: Patient called office today and wound RN was worried about brown/ grey changes to her ostomy. Son Ann [name] also said this was new. Patient had some dusky mucosal changes post op but viable ostomy internally.   She says she has not had much output since Sunday. She otherwise says she has been well. Her midline wound has granulation.    Objective: Vital signs in last 24 hours: Temp:  [98.3 F (36.8 C)] 98.3 F (36.8 C) (01/11 1414) Pulse Rate:  [85] 85 (01/11 1414) Resp:  [17] 17 (01/11 1414) BP: (126)/(88) 126/88 (01/11 1414) SpO2:  [93 %] 93 % (01/11 1414) Weight:  [116.3 kg] 116.3 kg (01/11 1409)    Intake/Output from previous day: No intake/output data recorded. Intake/Output this shift: No intake/output data recorded.  General appearance: alert, cooperative and no distress Resp: normal work of breathing GI: soft, midline wound with granulation, ostomy with mucocutaneous separation everywhere except 6 o clock location, some fibrinous sloughing and fibrinous sloughing on stoma, digitalized down to fascai and patent, test tube with pink mucosa deep, silver nitrate to the fibrinous sloughing and sloughing on bud cut off Minor skin thickening from irritation   Pre silver nitrate with fibrinous sloughing    Post silver nitrate     Lab Results:  Recent Labs    06/06/20 1438  WBC 7.7  HGB 10.4*  HCT 36.3  PLT 308   BMET Recent Labs    06/06/20 1438  NA 141  K 3.7  CL 101  CO2 27  GLUCOSE 126*  BUN 17  CREATININE 0.76  CALCIUM 8.6*   PT/INR No results for input(s): LABPROT, INR in the last 72 hours.  Personally reviewed- no abscess or fluid changes, some post operative stranding around the ostomy Studies/Results: CT ABDOMEN PELVIS W CONTRAST  Result Date: 06/06/2020 CLINICAL DATA:  75 year old female with abdominal pain. EXAM: CT ABDOMEN AND PELVIS WITH CONTRAST TECHNIQUE: Multidetector CT imaging  of the abdomen and pelvis was performed using the standard protocol following bolus administration of intravenous contrast. CONTRAST:  154mL OMNIPAQUE IOHEXOL 300 MG/ML  SOLN COMPARISON:  CT abdomen pelvis dated 05/14/2020. FINDINGS: Lower chest: Bibasilar linear atelectasis/scarring. The visualized lung bases are otherwise clear. Borderline cardiomegaly. No intra-abdominal free air or free fluid. Hepatobiliary: Probable fatty liver. No intrahepatic biliary dilatation. Cholecystectomy. No retained calcified stone noted in the central CBD. Pancreas: Unremarkable. No pancreatic ductal dilatation or surrounding inflammatory changes. Spleen: Normal in size without focal abnormality. Adrenals/Urinary Tract: The adrenal glands unremarkable. There is no hydronephrosis on either side. There is symmetric enhancement and excretion of contrast by both kidneys. The visualized ureters and urinary bladder appear unremarkable. Stomach/Bowel: There is postsurgical changes of colectomy with a left lower quadrant colostomy. There is moderate stool throughout the colon. There is no bowel obstruction. There is stranding of the fat surrounding the segment of the distal colon at the colostomy which may be postsurgical. Clinical correlation is recommended to exclude an inflammatory or infectious etiology. Appendectomy. Vascular/Lymphatic: Moderate aortoiliac atherosclerotic disease. The IVC is unremarkable. No portal venous gas. There is no adenopathy. Reproductive: Hysterectomy. No adnexal masses. Other: Midline vertical anterior pelvic wall incisional scar. Diffuse skin thickening of the anterior pelvic wall may represent cellulitis. Clinical correlation is recommended. No drainable fluid collection. Musculoskeletal: Degenerative changes of the spine. No acute osseous pathology. IMPRESSION: 1. Postsurgical changes of colectomy with a left lower quadrant colostomy. There is stranding of the fat surrounding the  segment of the distal colon  at the colostomy which may be postsurgical. Clinical correlation is recommended to exclude an inflammatory or infectious etiology. No bowel obstruction. 2. Skin thickening of the anterior pelvic wall may represent cellulitis. Clinical correlation is recommended. No drainable fluid collection. 3. Aortic Atherosclerosis (ICD10-I70.0). Electronically Signed   By: Anner Crete M.D.   On: 06/06/2020 16:30    Anti-infectives: Anti-infectives (From admission, onward)   None      Assessment/Plan: Ann [name] is a 75 yo who has ostomy in place with mucocutaneous sloughing but patent and viable ostomy. May get some retraction down. Will monitor closely. Patient made aware of silver nitrate and drainage that will be greyish over next few days.  Ostomy replaced [place] RN coming  Miralax to get bowel moving, no signs of obstruction Will see on Tuesday  Future Appointments  Date Time Provider [place]  06/13/2020 10:30 AM Virl Cagey, MD RS-RS None  07/04/2020 10:40 AM Jamse Arn, MD CPR-PRMA CPR  08/16/2020 11:00 AM Cassandria Anger, MD REA-REA None  10/04/2020  1:20 PM Arnoldo Lenis, MD CVD-RVILLE [place] [facility]     LOS: 0 days    Virl Cagey 06/06/2020
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
## 2020-06-06 NOTE — Discharge Instructions (Signed)
Start taking Miralax daily to help prevent constipation (no prescription needed).

## 2020-06-06 NOTE — ED Triage Notes (Signed)
Pt to the ED per Dr. Constance Haw for abdominal pain and stoma check.  Pt states her stoma in a different color than it has been since surgery in Dec. 2021.

## 2020-06-07 ENCOUNTER — Telehealth: Payer: Self-pay | Admitting: *Deleted

## 2020-06-07 DIAGNOSIS — D62 Acute posthemorrhagic anemia: Secondary | ICD-10-CM | POA: Diagnosis not present

## 2020-06-07 DIAGNOSIS — Z4801 Encounter for change or removal of surgical wound dressing: Secondary | ICD-10-CM | POA: Diagnosis not present

## 2020-06-07 DIAGNOSIS — Z48815 Encounter for surgical aftercare following surgery on the digestive system: Secondary | ICD-10-CM | POA: Diagnosis not present

## 2020-06-07 DIAGNOSIS — E1165 Type 2 diabetes mellitus with hyperglycemia: Secondary | ICD-10-CM | POA: Diagnosis not present

## 2020-06-07 DIAGNOSIS — K5721 Diverticulitis of large intestine with perforation and abscess with bleeding: Secondary | ICD-10-CM | POA: Diagnosis not present

## 2020-06-07 DIAGNOSIS — Z433 Encounter for attention to colostomy: Secondary | ICD-10-CM | POA: Diagnosis not present

## 2020-06-07 NOTE — Telephone Encounter (Signed)
Aaron Edelman OT [facility] called for POC beginning next week 2wk3.  Approval given.

## 2020-06-08 DIAGNOSIS — R5381 Other malaise: Secondary | ICD-10-CM | POA: Diagnosis not present

## 2020-06-09 DIAGNOSIS — Z48815 Encounter for surgical aftercare following surgery on the digestive system: Secondary | ICD-10-CM | POA: Diagnosis not present

## 2020-06-09 DIAGNOSIS — Z4801 Encounter for change or removal of surgical wound dressing: Secondary | ICD-10-CM | POA: Diagnosis not present

## 2020-06-09 DIAGNOSIS — D62 Acute posthemorrhagic anemia: Secondary | ICD-10-CM | POA: Diagnosis not present

## 2020-06-09 DIAGNOSIS — E1165 Type 2 diabetes mellitus with hyperglycemia: Secondary | ICD-10-CM | POA: Diagnosis not present

## 2020-06-09 DIAGNOSIS — Z433 Encounter for attention to colostomy: Secondary | ICD-10-CM | POA: Diagnosis not present

## 2020-06-09 DIAGNOSIS — K5721 Diverticulitis of large intestine with perforation and abscess with bleeding: Secondary | ICD-10-CM | POA: Diagnosis not present

## 2020-06-13 ENCOUNTER — Ambulatory Visit: Payer: Medicare Other | Admitting: General Surgery

## 2020-06-13 ENCOUNTER — Ambulatory Visit (INDEPENDENT_AMBULATORY_CARE_PROVIDER_SITE_OTHER): Payer: Medicare Other | Admitting: General Surgery

## 2020-06-13 ENCOUNTER — Encounter: Payer: Self-pay | Admitting: General Surgery

## 2020-06-13 ENCOUNTER — Other Ambulatory Visit: Payer: Self-pay

## 2020-06-13 VITALS — BP 138/63 | HR 73 | Temp 98.4°F | Resp 18

## 2020-06-13 DIAGNOSIS — Z4801 Encounter for change or removal of surgical wound dressing: Secondary | ICD-10-CM | POA: Diagnosis not present

## 2020-06-13 DIAGNOSIS — K5721 Diverticulitis of large intestine with perforation and abscess with bleeding: Secondary | ICD-10-CM | POA: Diagnosis not present

## 2020-06-13 DIAGNOSIS — D62 Acute posthemorrhagic anemia: Secondary | ICD-10-CM | POA: Diagnosis not present

## 2020-06-13 DIAGNOSIS — Z933 Colostomy status: Secondary | ICD-10-CM

## 2020-06-13 DIAGNOSIS — Z48815 Encounter for surgical aftercare following surgery on the digestive system: Secondary | ICD-10-CM | POA: Diagnosis not present

## 2020-06-13 DIAGNOSIS — Z433 Encounter for attention to colostomy: Secondary | ICD-10-CM | POA: Diagnosis not present

## 2020-06-13 DIAGNOSIS — K9409 Other complications of colostomy: Secondary | ICD-10-CM

## 2020-06-13 DIAGNOSIS — E1165 Type 2 diabetes mellitus with hyperglycemia: Secondary | ICD-10-CM | POA: Diagnosis not present

## 2020-06-13 MED ORDER — OXYCODONE HCL 5 MG PO TABS: 5.0000 mg | ORAL_TABLET | Freq: Three times a day (TID) | ORAL | 0 refills | Status: DC | PRN

## 2020-06-13 NOTE — Patient Instructions (Signed)
Keep wafer on as long as you can. Use barrier film on the skin when changing. Keep stools soft. You may have to take some miralax to get it more liquid.

## 2020-06-13 NOTE — Progress Notes (Signed)
[facility]   HPI:  75 y.o. Female presents to clinic for follow-up evaluation of the end colostomy with some mucocutaneous retraction. She is having issues with the wafers staying and having blow outs. Shanon Brow her son who is changing them is here with her. She still has some minor soreness inferior in her abdomen.   Review of Systems No fever or chills Lower abdominal pain Solid stools causing wafer to blow out  All other review of systems: otherwise negative   Vital Signs:  BP 138/63   Pulse 73   Temp 98.4 F (36.9 C) (Other (Comment))   Resp 18   SpO2 90%    Physical Exam:  Physical Exam HENT:     Nose: Nose normal.  Cardiovascular:     Rate and Rhythm: Normal rate.  Pulmonary:     Effort: Pulmonary effort is normal.  Abdominal:     General: There is no distension.     Palpations: Abdomen is soft.     Tenderness: There is abdominal tenderness.     Comments: Midline with granulation tissue, no erythema or drainage, ostomy  With some retraction and mucocutaneous  Separation, digitized and some minor narrowing improved with dilation digitally, minor bleeding of ostomy, silver nitrate to the edges, wafer replaced, skin inferior is on ostomy macerated  Neurological:     Mental Status: She is alert.          Assessment:  75 y.o. yo Female with an end colostomy in place, some retraction with mucocutaneous separation. Overall doing ok and ostomy working and having some leakage that is causing her to have to change the bag and leading to irritated skin.   Plan:  Keep wafer on as long as you can. Use barrier film on the skin when changing. Keep stools soft. You may have to take some miralax to get it more liquid.  Will need to get colonoscopy in the upcoming months once we decide about reversal  Roxicodone refill sent in to pharmacy Will see her back next week to check on ostomy and digitize it   Future Appointments  Date Time Provider [place]  06/20/2020 10:45 AM Virl Cagey, MD RS-RS None  07/04/2020 10:40 AM Jamse Arn, MD CPR-PRMA CPR  08/16/2020 11:00 AM Cassandria Anger, MD REA-REA None  10/04/2020  1:20 PM Branch, Alphonse Guild, MD CVD-RVILLE [place] [facility]     All of the above recommendations were discussed with the patient 2and patient's family, and all of patient's and family's questions were answered to their expressed satisfaction.  Curlene Labrum, MD [facility] [address] [place], [place] [address] [phone] (office)

## 2020-06-16 DIAGNOSIS — Z4801 Encounter for change or removal of surgical wound dressing: Secondary | ICD-10-CM | POA: Diagnosis not present

## 2020-06-16 DIAGNOSIS — Z48815 Encounter for surgical aftercare following surgery on the digestive system: Secondary | ICD-10-CM | POA: Diagnosis not present

## 2020-06-16 DIAGNOSIS — D62 Acute posthemorrhagic anemia: Secondary | ICD-10-CM | POA: Diagnosis not present

## 2020-06-16 DIAGNOSIS — I82501 Chronic embolism and thrombosis of unspecified deep veins of right lower extremity: Secondary | ICD-10-CM | POA: Diagnosis not present

## 2020-06-16 DIAGNOSIS — K5721 Diverticulitis of large intestine with perforation and abscess with bleeding: Secondary | ICD-10-CM | POA: Diagnosis not present

## 2020-06-16 DIAGNOSIS — E1165 Type 2 diabetes mellitus with hyperglycemia: Secondary | ICD-10-CM | POA: Diagnosis not present

## 2020-06-16 DIAGNOSIS — J449 Chronic obstructive pulmonary disease, unspecified: Secondary | ICD-10-CM | POA: Diagnosis not present

## 2020-06-16 DIAGNOSIS — E1159 Type 2 diabetes mellitus with other circulatory complications: Secondary | ICD-10-CM | POA: Diagnosis not present

## 2020-06-16 DIAGNOSIS — Z933 Colostomy status: Secondary | ICD-10-CM | POA: Diagnosis not present

## 2020-06-16 DIAGNOSIS — Z433 Encounter for attention to colostomy: Secondary | ICD-10-CM | POA: Diagnosis not present

## 2020-06-20 ENCOUNTER — Encounter: Payer: Self-pay | Admitting: General Surgery

## 2020-06-20 ENCOUNTER — Other Ambulatory Visit: Payer: Self-pay

## 2020-06-20 ENCOUNTER — Ambulatory Visit: Payer: Medicare Other | Admitting: General Surgery

## 2020-06-20 ENCOUNTER — Ambulatory Visit (INDEPENDENT_AMBULATORY_CARE_PROVIDER_SITE_OTHER): Payer: Medicare Other | Admitting: General Surgery

## 2020-06-20 VITALS — BP 121/79 | HR 88 | Temp 97.9°F | Resp 16

## 2020-06-20 DIAGNOSIS — Z433 Encounter for attention to colostomy: Secondary | ICD-10-CM | POA: Diagnosis not present

## 2020-06-20 DIAGNOSIS — E1165 Type 2 diabetes mellitus with hyperglycemia: Secondary | ICD-10-CM | POA: Diagnosis not present

## 2020-06-20 DIAGNOSIS — Z48815 Encounter for surgical aftercare following surgery on the digestive system: Secondary | ICD-10-CM | POA: Diagnosis not present

## 2020-06-20 DIAGNOSIS — D62 Acute posthemorrhagic anemia: Secondary | ICD-10-CM | POA: Diagnosis not present

## 2020-06-20 DIAGNOSIS — Z933 Colostomy status: Secondary | ICD-10-CM

## 2020-06-20 DIAGNOSIS — Z4801 Encounter for change or removal of surgical wound dressing: Secondary | ICD-10-CM | POA: Diagnosis not present

## 2020-06-20 DIAGNOSIS — K9409 Other complications of colostomy: Secondary | ICD-10-CM

## 2020-06-20 DIAGNOSIS — K5721 Diverticulitis of large intestine with perforation and abscess with bleeding: Secondary | ICD-10-CM | POA: Diagnosis not present

## 2020-06-20 NOTE — Progress Notes (Signed)
[facility]   HPI:  75 y.o. Female presents to clinic for follow-up evaluation of her ostomy. She says her ostomy bags are staying on better and her stools are more liquid. The ostomy and skin are looking healthier.  She changed her bag yesterday.   Review of Systems:  No fever or chills Improving skin around ostomy barrier creams  All other review of systems: otherwise negative   Vital Signs:  BP 121/79   Pulse 88   Temp 97.9 F (36.6 C) (Oral)   Resp 16   SpO2 92%    Physical Exam:  Physical Exam Vitals reviewed.  Constitutional:      Appearance: She is obese.  Cardiovascular:     Rate and Rhythm: Normal rate.  Pulmonary:     Effort: Pulmonary effort is normal.  Abdominal:     General: There is no distension.     Palpations: Abdomen is soft.     Tenderness: There is no abdominal tenderness.     Comments: Midline healing, granulation, dressing replaced, ostomy pink and patent, one system bag in place, able to digitize through bag with pinky and index finger, minor tightness at entry, stool in bag, no skin irritation seen  Neurological:     Mental Status: She is alert.      Assessment:  75 y.o. yo Female with ostomy in place and some retraction. Overall doing well and only minor narrowing at this time. Will continue to monitor closely.   Plan:  Continue wound care and ostomy care. Continue on laxative to keep stools softer.   Future Appointments  Date Time Provider [place]  06/29/2020  1:30 PM Virl Cagey, MD RS-RS None  07/04/2020 10:40 AM Jamse Arn, MD CPR-PRMA CPR  08/16/2020 11:00 AM Cassandria Anger, MD REA-REA None  10/04/2020  1:20 PM Harl Bowie Alphonse Guild, MD CVD[facility] [place]    All of the above recommendations were discussed with the patient and patient's family, and all of patient's and family's questions were answered to their expressed satisfaction.  Curlene Labrum, MD [facility] [address] [place], [place] [address] [phone] (office)

## 2020-06-20 NOTE — Patient Instructions (Signed)
Continue wound care and ostomy care. Continue on laxative to keep stools softer.

## 2020-06-21 DIAGNOSIS — Z433 Encounter for attention to colostomy: Secondary | ICD-10-CM | POA: Diagnosis not present

## 2020-06-21 DIAGNOSIS — D62 Acute posthemorrhagic anemia: Secondary | ICD-10-CM | POA: Diagnosis not present

## 2020-06-21 DIAGNOSIS — Z48815 Encounter for surgical aftercare following surgery on the digestive system: Secondary | ICD-10-CM | POA: Diagnosis not present

## 2020-06-21 DIAGNOSIS — K5721 Diverticulitis of large intestine with perforation and abscess with bleeding: Secondary | ICD-10-CM | POA: Diagnosis not present

## 2020-06-21 DIAGNOSIS — E1165 Type 2 diabetes mellitus with hyperglycemia: Secondary | ICD-10-CM | POA: Diagnosis not present

## 2020-06-21 DIAGNOSIS — Z4801 Encounter for change or removal of surgical wound dressing: Secondary | ICD-10-CM | POA: Diagnosis not present

## 2020-06-22 DIAGNOSIS — E1165 Type 2 diabetes mellitus with hyperglycemia: Secondary | ICD-10-CM | POA: Diagnosis not present

## 2020-06-22 DIAGNOSIS — Z4801 Encounter for change or removal of surgical wound dressing: Secondary | ICD-10-CM | POA: Diagnosis not present

## 2020-06-22 DIAGNOSIS — K5721 Diverticulitis of large intestine with perforation and abscess with bleeding: Secondary | ICD-10-CM | POA: Diagnosis not present

## 2020-06-22 DIAGNOSIS — Z48815 Encounter for surgical aftercare following surgery on the digestive system: Secondary | ICD-10-CM | POA: Diagnosis not present

## 2020-06-22 DIAGNOSIS — D62 Acute posthemorrhagic anemia: Secondary | ICD-10-CM | POA: Diagnosis not present

## 2020-06-22 DIAGNOSIS — Z433 Encounter for attention to colostomy: Secondary | ICD-10-CM | POA: Diagnosis not present

## 2020-06-23 DIAGNOSIS — K5721 Diverticulitis of large intestine with perforation and abscess with bleeding: Secondary | ICD-10-CM | POA: Diagnosis not present

## 2020-06-23 DIAGNOSIS — Z4801 Encounter for change or removal of surgical wound dressing: Secondary | ICD-10-CM | POA: Diagnosis not present

## 2020-06-23 DIAGNOSIS — E1165 Type 2 diabetes mellitus with hyperglycemia: Secondary | ICD-10-CM | POA: Diagnosis not present

## 2020-06-23 DIAGNOSIS — D62 Acute posthemorrhagic anemia: Secondary | ICD-10-CM | POA: Diagnosis not present

## 2020-06-23 DIAGNOSIS — Z48815 Encounter for surgical aftercare following surgery on the digestive system: Secondary | ICD-10-CM | POA: Diagnosis not present

## 2020-06-23 DIAGNOSIS — Z433 Encounter for attention to colostomy: Secondary | ICD-10-CM | POA: Diagnosis not present

## 2020-06-27 ENCOUNTER — Telehealth: Payer: Self-pay

## 2020-06-27 DIAGNOSIS — Z48815 Encounter for surgical aftercare following surgery on the digestive system: Secondary | ICD-10-CM | POA: Diagnosis not present

## 2020-06-27 DIAGNOSIS — Z433 Encounter for attention to colostomy: Secondary | ICD-10-CM | POA: Diagnosis not present

## 2020-06-27 DIAGNOSIS — Z4801 Encounter for change or removal of surgical wound dressing: Secondary | ICD-10-CM | POA: Diagnosis not present

## 2020-06-27 DIAGNOSIS — D62 Acute posthemorrhagic anemia: Secondary | ICD-10-CM | POA: Diagnosis not present

## 2020-06-27 DIAGNOSIS — K5721 Diverticulitis of large intestine with perforation and abscess with bleeding: Secondary | ICD-10-CM | POA: Diagnosis not present

## 2020-06-27 DIAGNOSIS — E1165 Type 2 diabetes mellitus with hyperglycemia: Secondary | ICD-10-CM | POA: Diagnosis not present

## 2020-06-27 NOTE — Telephone Encounter (Signed)
1234 pm.  Phone call made to patient to complete a telephonic visit or schedule an in-person visit.  HIPPA compliant message left on patient's VM.  1236 pm.  Return call received from patient.  She is able to provide an update on the last 2 months.  She reports her most recent hospitalization in January.  Patient did complete a follow up visit last month with General Surgery.  Ostomy is pink in color and patient states she is having some issues regulating her bowel movements.  Most recent bowel movement for today. She notes a hx of IBS and states sometimes are bowels are formed and sometimes they are looser.  Patient is currently using miralax and metamucil .  She denies using docusate at this time.  She reports some occasional issues with cramping that is consistent with her IBS symptoms.  Blood sugars are being well-managed per patient.  She is typically at 200 or just above 200.  She reports her reading today was 166.  [facility] RN is seeing patient 1-2x weekly.  She has completed her PT sessions.  Patient reports no falls.  She is using a rolling walker in the home and a wheelchair when she is out with her spouse.  Patient is monitoring her weights daily.  She reports her weight is 258.8 today.  This is higher than yesterday and she reports this is due to her bowel movements.  She has some swelling present to her left foot, ankle and lower leg.  She denies any shortness of breath.  Ongoing education provided on on the importance of daily weights and reporting weight gains to her PCP.  3 lbs or more in a 24 hour period or 5 lb gain in a week.  No other concerns voiced by patient.  I have advised that Palliative Care will continue to follow up with monthly check-ins and in-person visits can be made.  Patient denies any needs/concerns at this time.

## 2020-06-29 ENCOUNTER — Ambulatory Visit (INDEPENDENT_AMBULATORY_CARE_PROVIDER_SITE_OTHER): Payer: Medicare Other | Admitting: General Surgery

## 2020-06-29 ENCOUNTER — Other Ambulatory Visit: Payer: Self-pay

## 2020-06-29 ENCOUNTER — Encounter: Payer: Self-pay | Admitting: General Surgery

## 2020-06-29 VITALS — BP 124/79 | HR 64 | Temp 97.6°F | Resp 18

## 2020-06-29 DIAGNOSIS — K9409 Other complications of colostomy: Secondary | ICD-10-CM

## 2020-06-29 NOTE — Progress Notes (Signed)
[facility]   HPI:  75 y.o. Female presents to clinic for follow-up evaluation of her ostomy. The area is looking better around the ostomy and they are changing the bag 2 times per week. The ostomy is pink and is shrinking/ retracting some more.  Having liquid stool but it is a little more dark brown today.  Review of Systems:  Midline wound healing Some pulling pain inferior abdomen  All other review of systems: otherwise negative   Vital Signs:  BP 124/79   Pulse 64   Temp 97.6 F (36.4 C) (Other (Comment))   Resp 18   SpO2 95%    Physical Exam:  Physical Exam Vitals reviewed.  Constitutional:      Appearance: She is obese.  Cardiovascular:     Rate and Rhythm: Normal rate.  Pulmonary:     Effort: Pulmonary effort is normal.  Abdominal:     General: There is no distension.     Palpations: Abdomen is soft.     Tenderness: There is no abdominal tenderness.     Comments: Healing midline incision, ostomy with stool in bag, pink ostomy with some narrowing and retraction, the mucosa is about 1.5 inches wide, I can dilate the area with my pinky and first finger, minor bleeding       Assessment:  75 y.o. yo Female with end colostomy in place with some retraction and stenosis. Digital dilation continuing to work.   Plan:  Will get you refer to GI for colonoscopy.  Continue packing wound and ostomy care. Will see back in 2 weeks and continue to dilate ostomy.   Future Appointments  Date Time Provider [place]  07/04/2020 10:40 AM Jamse Arn, MD CPR-PRMA CPR  07/13/2020  1:15 PM Virl Cagey, MD RS-RS None  08/16/2020 11:00 AM Cassandria Anger, MD REA-REA None  10/04/2020  1:20 PM Branch, Alphonse Guild, MD [facility]    All of the above recommendations were discussed with the patient and patient's family, and all of patient's and family's questions were answered to their expressed satisfaction.  Curlene Labrum,  MD [facility] [address] [place], [place] [address] [phone] (office)

## 2020-06-29 NOTE — Patient Instructions (Signed)
Will get you refer to GI for colonoscopy.  Continue packing wound and ostomy care.

## 2020-06-30 ENCOUNTER — Telehealth: Payer: Self-pay | Admitting: Internal Medicine

## 2020-06-30 NOTE — Telephone Encounter (Signed)
Routing this message to the [facility] Clinical Pool to schedule.

## 2020-06-30 NOTE — Telephone Encounter (Signed)
Patient takes eliquis BID, does she need the okay from cardiology to hold or can we just schedule her? Thanks

## 2020-06-30 NOTE — Telephone Encounter (Signed)
Can we please set this patient up for colonoscopy.  Diagnosis is diverticulitis.  ASA 3.  Thank you

## 2020-07-03 DIAGNOSIS — Z9981 Dependence on supplemental oxygen: Secondary | ICD-10-CM | POA: Diagnosis not present

## 2020-07-03 DIAGNOSIS — Z4801 Encounter for change or removal of surgical wound dressing: Secondary | ICD-10-CM | POA: Diagnosis not present

## 2020-07-03 DIAGNOSIS — E1142 Type 2 diabetes mellitus with diabetic polyneuropathy: Secondary | ICD-10-CM | POA: Diagnosis not present

## 2020-07-03 DIAGNOSIS — E662 Morbid (severe) obesity with alveolar hypoventilation: Secondary | ICD-10-CM | POA: Diagnosis not present

## 2020-07-03 DIAGNOSIS — K5721 Diverticulitis of large intestine with perforation and abscess with bleeding: Secondary | ICD-10-CM | POA: Diagnosis not present

## 2020-07-03 DIAGNOSIS — E039 Hypothyroidism, unspecified: Secondary | ICD-10-CM | POA: Diagnosis not present

## 2020-07-03 DIAGNOSIS — Z79891 Long term (current) use of opiate analgesic: Secondary | ICD-10-CM | POA: Diagnosis not present

## 2020-07-03 DIAGNOSIS — Z48815 Encounter for surgical aftercare following surgery on the digestive system: Secondary | ICD-10-CM | POA: Diagnosis not present

## 2020-07-03 DIAGNOSIS — E2740 Unspecified adrenocortical insufficiency: Secondary | ICD-10-CM | POA: Diagnosis not present

## 2020-07-03 DIAGNOSIS — I2510 Atherosclerotic heart disease of native coronary artery without angina pectoris: Secondary | ICD-10-CM | POA: Diagnosis not present

## 2020-07-03 DIAGNOSIS — I4891 Unspecified atrial fibrillation: Secondary | ICD-10-CM | POA: Diagnosis not present

## 2020-07-03 DIAGNOSIS — I5032 Chronic diastolic (congestive) heart failure: Secondary | ICD-10-CM | POA: Diagnosis not present

## 2020-07-03 DIAGNOSIS — T380X5D Adverse effect of glucocorticoids and synthetic analogues, subsequent encounter: Secondary | ICD-10-CM | POA: Diagnosis not present

## 2020-07-03 DIAGNOSIS — N3281 Overactive bladder: Secondary | ICD-10-CM | POA: Diagnosis not present

## 2020-07-03 DIAGNOSIS — J449 Chronic obstructive pulmonary disease, unspecified: Secondary | ICD-10-CM | POA: Diagnosis not present

## 2020-07-03 DIAGNOSIS — I110 Hypertensive heart disease with heart failure: Secondary | ICD-10-CM | POA: Diagnosis not present

## 2020-07-03 DIAGNOSIS — D62 Acute posthemorrhagic anemia: Secondary | ICD-10-CM | POA: Diagnosis not present

## 2020-07-03 DIAGNOSIS — E538 Deficiency of other specified B group vitamins: Secondary | ICD-10-CM | POA: Diagnosis not present

## 2020-07-03 DIAGNOSIS — Z6841 Body Mass Index (BMI) 40.0 and over, adult: Secondary | ICD-10-CM | POA: Diagnosis not present

## 2020-07-03 DIAGNOSIS — E46 Unspecified protein-calorie malnutrition: Secondary | ICD-10-CM | POA: Diagnosis not present

## 2020-07-03 DIAGNOSIS — E8809 Other disorders of plasma-protein metabolism, not elsewhere classified: Secondary | ICD-10-CM | POA: Diagnosis not present

## 2020-07-03 DIAGNOSIS — Z433 Encounter for attention to colostomy: Secondary | ICD-10-CM | POA: Diagnosis not present

## 2020-07-03 DIAGNOSIS — E1165 Type 2 diabetes mellitus with hyperglycemia: Secondary | ICD-10-CM | POA: Diagnosis not present

## 2020-07-03 DIAGNOSIS — K219 Gastro-esophageal reflux disease without esophagitis: Secondary | ICD-10-CM | POA: Diagnosis not present

## 2020-07-03 DIAGNOSIS — E785 Hyperlipidemia, unspecified: Secondary | ICD-10-CM | POA: Diagnosis not present

## 2020-07-04 ENCOUNTER — Other Ambulatory Visit: Payer: Self-pay

## 2020-07-04 ENCOUNTER — Encounter: Payer: Self-pay | Admitting: Physical Medicine & Rehabilitation

## 2020-07-04 ENCOUNTER — Encounter: Payer: Medicare Other | Attending: Physical Medicine & Rehabilitation | Admitting: Physical Medicine & Rehabilitation

## 2020-07-04 VITALS — BP 116/82 | HR 69 | Temp 99.0°F | Ht 63.0 in

## 2020-07-04 DIAGNOSIS — R269 Unspecified abnormalities of gait and mobility: Secondary | ICD-10-CM | POA: Diagnosis not present

## 2020-07-04 DIAGNOSIS — Z794 Long term (current) use of insulin: Secondary | ICD-10-CM | POA: Diagnosis not present

## 2020-07-04 DIAGNOSIS — E1121 Type 2 diabetes mellitus with diabetic nephropathy: Secondary | ICD-10-CM | POA: Insufficient documentation

## 2020-07-04 DIAGNOSIS — R5381 Other malaise: Secondary | ICD-10-CM | POA: Diagnosis not present

## 2020-07-04 DIAGNOSIS — I5032 Chronic diastolic (congestive) heart failure: Secondary | ICD-10-CM | POA: Diagnosis not present
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
## 2020-07-06 DIAGNOSIS — Z4801 Encounter for change or removal of surgical wound dressing: Secondary | ICD-10-CM | POA: Diagnosis not present

## 2020-07-06 DIAGNOSIS — E1165 Type 2 diabetes mellitus with hyperglycemia: Secondary | ICD-10-CM | POA: Diagnosis not present

## 2020-07-06 DIAGNOSIS — D62 Acute posthemorrhagic anemia: Secondary | ICD-10-CM | POA: Diagnosis not present

## 2020-07-06 DIAGNOSIS — Z48815 Encounter for surgical aftercare following surgery on the digestive system: Secondary | ICD-10-CM | POA: Diagnosis not present

## 2020-07-06 DIAGNOSIS — K5721 Diverticulitis of large intestine with perforation and abscess with bleeding: Secondary | ICD-10-CM | POA: Diagnosis not present

## 2020-07-06 DIAGNOSIS — Z433 Encounter for attention to colostomy: Secondary | ICD-10-CM | POA: Diagnosis not present

## 2020-07-11 ENCOUNTER — Ambulatory Visit: Payer: Medicare Other | Admitting: General Surgery

## 2020-07-11 DIAGNOSIS — Z48815 Encounter for surgical aftercare following surgery on the digestive system: Secondary | ICD-10-CM | POA: Diagnosis not present

## 2020-07-11 DIAGNOSIS — K5721 Diverticulitis of large intestine with perforation and abscess with bleeding: Secondary | ICD-10-CM | POA: Diagnosis not present

## 2020-07-11 DIAGNOSIS — E1165 Type 2 diabetes mellitus with hyperglycemia: Secondary | ICD-10-CM | POA: Diagnosis not present

## 2020-07-11 DIAGNOSIS — D62 Acute posthemorrhagic anemia: Secondary | ICD-10-CM | POA: Diagnosis not present

## 2020-07-11 DIAGNOSIS — Z433 Encounter for attention to colostomy: Secondary | ICD-10-CM | POA: Diagnosis not present

## 2020-07-11 DIAGNOSIS — Z4801 Encounter for change or removal of surgical wound dressing: Secondary | ICD-10-CM | POA: Diagnosis not present

## 2020-07-13 ENCOUNTER — Ambulatory Visit (INDEPENDENT_AMBULATORY_CARE_PROVIDER_SITE_OTHER): Payer: Medicare Other | Admitting: General Surgery

## 2020-07-13 ENCOUNTER — Encounter: Payer: Self-pay | Admitting: General Surgery

## 2020-07-13 ENCOUNTER — Other Ambulatory Visit: Payer: Self-pay

## 2020-07-13 VITALS — BP 126/80 | HR 78 | Temp 97.6°F | Resp 16

## 2020-07-13 DIAGNOSIS — Z933 Colostomy status: Secondary | ICD-10-CM

## 2020-07-13 DIAGNOSIS — K9409 Other complications of colostomy: Secondary | ICD-10-CM

## 2020-07-13 NOTE — Patient Instructions (Signed)
Keep stools soft and regular. I will get in touch with GI about colonoscopy.

## 2020-07-13 NOTE — Progress Notes (Signed)
[facility]   HPI:  75 y.o. Female presents to clinic for follow-up evaluation of her end colostomy. She is having output and her midline is pretty much healed.  Review of Systems:  Output but some harder stools  All other review of systems: otherwise negative   Vital Signs:  BP 126/80   Pulse 78   Temp 97.6 F (36.4 C) (Other (Comment))   Resp 16   SpO2 92%    Physical Exam:  Physical Exam Vitals reviewed.  Constitutional:      Appearance: She is obese.  Cardiovascular:     Rate and Rhythm: Normal rate.  Pulmonary:     Effort: Pulmonary effort is normal.  Abdominal:     General: There is no distension.     Palpations: Abdomen is soft.     Tenderness: There is no abdominal tenderness.     Comments: Midline healed, ostomy pink with some contraction/ retraction, dilated with index finger, patent   Neurological:     Mental Status: She is alert.     Assessment:  75 y.o. yo Female with end colostomy in place. She still needs to be scheduled for colonoscopy before we can even think about reversal. So far her ostomy is patent with dilations.  Plan:  Keep stools soft and regular. I will get in touch with GI about colonoscopy.   Future Appointments  Date Time Provider [place]  08/03/2020  1:30 PM Virl Cagey, MD RS-RS None  08/14/2020  1:20 PM Jamse Arn, MD CPR-PRMA CPR  08/16/2020 11:00 AM Cassandria Anger, MD REA-REA None  10/04/2020  1:20 PM Branch, Alphonse Guild, MD [facility] [place] [facility]     All of the above recommendations were discussed with the patient and patient's family, and all of patient's and family's questions were answered to their expressed satisfaction.  Curlene Labrum, MD [facility] [address] [place], [place] [address] [phone] (office)

## 2020-07-17 ENCOUNTER — Telehealth: Payer: Self-pay | Admitting: Internal Medicine

## 2020-07-17 ENCOUNTER — Encounter: Payer: Self-pay | Admitting: *Deleted

## 2020-07-17 DIAGNOSIS — J449 Chronic obstructive pulmonary disease, unspecified: Secondary | ICD-10-CM | POA: Diagnosis not present

## 2020-07-17 DIAGNOSIS — E11641 Type 2 diabetes mellitus with hypoglycemia with coma: Secondary | ICD-10-CM | POA: Diagnosis not present

## 2020-07-17 NOTE — Telephone Encounter (Signed)
Noted. See prior note

## 2020-07-17 NOTE — Telephone Encounter (Signed)
Patient returned call. She has been scheduled for 3/29 am appt. Aware will mail prep instructions with her pre-op/covid test appt. She voiced understanding.

## 2020-07-17 NOTE — Telephone Encounter (Signed)
Per Dr. Abbey Chatters: Ann [name] to just schedule her. She will need to hold Eliquis for 48 hours prior. Thank you ----   Called pt, LMOVM

## 2020-07-17 NOTE — Telephone Encounter (Signed)
Okay to just schedule her. She will need to hold Eliquis for 48 hours prior. Thank you

## 2020-07-18 DIAGNOSIS — E1142 Type 2 diabetes mellitus with diabetic polyneuropathy: Secondary | ICD-10-CM | POA: Diagnosis not present

## 2020-07-18 DIAGNOSIS — L603 Nail dystrophy: Secondary | ICD-10-CM | POA: Diagnosis not present

## 2020-07-18 DIAGNOSIS — L851 Acquired keratosis [keratoderma] palmaris et plantaris: Secondary | ICD-10-CM | POA: Diagnosis not present

## 2020-07-21 ENCOUNTER — Telehealth: Payer: Self-pay

## 2020-07-21 DIAGNOSIS — K5721 Diverticulitis of large intestine with perforation and abscess with bleeding: Secondary | ICD-10-CM | POA: Diagnosis not present

## 2020-07-21 DIAGNOSIS — Z48815 Encounter for surgical aftercare following surgery on the digestive system: Secondary | ICD-10-CM | POA: Diagnosis not present

## 2020-07-21 DIAGNOSIS — Z433 Encounter for attention to colostomy: Secondary | ICD-10-CM | POA: Diagnosis not present

## 2020-07-21 DIAGNOSIS — E1165 Type 2 diabetes mellitus with hyperglycemia: Secondary | ICD-10-CM | POA: Diagnosis not present

## 2020-07-21 DIAGNOSIS — Z4801 Encounter for change or removal of surgical wound dressing: Secondary | ICD-10-CM | POA: Diagnosis not present

## 2020-07-21 DIAGNOSIS — D62 Acute posthemorrhagic anemia: Secondary | ICD-10-CM | POA: Diagnosis not present

## 2020-07-21 NOTE — Telephone Encounter (Signed)
Pt called office, wanted to know where she was going to put enema. She has colostomy.  Spoke to pt, advised her to disregard enema that was listed on instructions since she has colostomy.

## 2020-07-25 DIAGNOSIS — N390 Urinary tract infection, site not specified: Secondary | ICD-10-CM | POA: Diagnosis not present

## 2020-07-25 DIAGNOSIS — K5721 Diverticulitis of large intestine with perforation and abscess with bleeding: Secondary | ICD-10-CM | POA: Diagnosis not present

## 2020-07-25 DIAGNOSIS — Z48815 Encounter for surgical aftercare following surgery on the digestive system: Secondary | ICD-10-CM | POA: Diagnosis not present

## 2020-07-25 DIAGNOSIS — Z4801 Encounter for change or removal of surgical wound dressing: Secondary | ICD-10-CM | POA: Diagnosis not present

## 2020-07-25 DIAGNOSIS — E1165 Type 2 diabetes mellitus with hyperglycemia: Secondary | ICD-10-CM | POA: Diagnosis not present

## 2020-07-25 DIAGNOSIS — Z433 Encounter for attention to colostomy: Secondary | ICD-10-CM | POA: Diagnosis not present

## 2020-07-25 DIAGNOSIS — D62 Acute posthemorrhagic anemia: Secondary | ICD-10-CM | POA: Diagnosis not present

## 2020-08-01 DIAGNOSIS — E1165 Type 2 diabetes mellitus with hyperglycemia: Secondary | ICD-10-CM | POA: Diagnosis not present

## 2020-08-01 DIAGNOSIS — Z48815 Encounter for surgical aftercare following surgery on the digestive system: Secondary | ICD-10-CM | POA: Diagnosis not present

## 2020-08-01 DIAGNOSIS — Z4801 Encounter for change or removal of surgical wound dressing: Secondary | ICD-10-CM | POA: Diagnosis not present

## 2020-08-01 DIAGNOSIS — Z433 Encounter for attention to colostomy: Secondary | ICD-10-CM | POA: Diagnosis not present

## 2020-08-01 DIAGNOSIS — D62 Acute posthemorrhagic anemia: Secondary | ICD-10-CM | POA: Diagnosis not present

## 2020-08-01 DIAGNOSIS — K5721 Diverticulitis of large intestine with perforation and abscess with bleeding: Secondary | ICD-10-CM | POA: Diagnosis not present

## 2020-08-02 DIAGNOSIS — E8809 Other disorders of plasma-protein metabolism, not elsewhere classified: Secondary | ICD-10-CM | POA: Diagnosis not present

## 2020-08-02 DIAGNOSIS — J449 Chronic obstructive pulmonary disease, unspecified: Secondary | ICD-10-CM | POA: Diagnosis not present

## 2020-08-02 DIAGNOSIS — K5732 Diverticulitis of large intestine without perforation or abscess without bleeding: Secondary | ICD-10-CM | POA: Diagnosis not present

## 2020-08-02 DIAGNOSIS — E2740 Unspecified adrenocortical insufficiency: Secondary | ICD-10-CM | POA: Diagnosis not present

## 2020-08-02 DIAGNOSIS — E1165 Type 2 diabetes mellitus with hyperglycemia: Secondary | ICD-10-CM | POA: Diagnosis not present

## 2020-08-02 DIAGNOSIS — E1142 Type 2 diabetes mellitus with diabetic polyneuropathy: Secondary | ICD-10-CM | POA: Diagnosis not present

## 2020-08-02 DIAGNOSIS — E039 Hypothyroidism, unspecified: Secondary | ICD-10-CM | POA: Diagnosis not present

## 2020-08-02 DIAGNOSIS — N390 Urinary tract infection, site not specified: Secondary | ICD-10-CM | POA: Diagnosis not present

## 2020-08-02 DIAGNOSIS — I480 Paroxysmal atrial fibrillation: Secondary | ICD-10-CM | POA: Diagnosis not present

## 2020-08-02 DIAGNOSIS — N189 Chronic kidney disease, unspecified: Secondary | ICD-10-CM | POA: Diagnosis not present

## 2020-08-02 DIAGNOSIS — I2510 Atherosclerotic heart disease of native coronary artery without angina pectoris: Secondary | ICD-10-CM | POA: Diagnosis not present

## 2020-08-02 DIAGNOSIS — I130 Hypertensive heart and chronic kidney disease with heart failure and stage 1 through stage 4 chronic kidney disease, or unspecified chronic kidney disease: Secondary | ICD-10-CM | POA: Diagnosis not present

## 2020-08-02 DIAGNOSIS — Z794 Long term (current) use of insulin: Secondary | ICD-10-CM | POA: Diagnosis not present

## 2020-08-02 DIAGNOSIS — E538 Deficiency of other specified B group vitamins: Secondary | ICD-10-CM | POA: Diagnosis not present

## 2020-08-02 DIAGNOSIS — E46 Unspecified protein-calorie malnutrition: Secondary | ICD-10-CM | POA: Diagnosis not present

## 2020-08-02 DIAGNOSIS — Z6841 Body Mass Index (BMI) 40.0 and over, adult: Secondary | ICD-10-CM | POA: Diagnosis not present

## 2020-08-02 DIAGNOSIS — E1122 Type 2 diabetes mellitus with diabetic chronic kidney disease: Secondary | ICD-10-CM | POA: Diagnosis not present

## 2020-08-02 DIAGNOSIS — Z79891 Long term (current) use of opiate analgesic: Secondary | ICD-10-CM | POA: Diagnosis not present

## 2020-08-02 DIAGNOSIS — K219 Gastro-esophageal reflux disease without esophagitis: Secondary | ICD-10-CM | POA: Diagnosis not present

## 2020-08-02 DIAGNOSIS — E662 Morbid (severe) obesity with alveolar hypoventilation: Secondary | ICD-10-CM | POA: Diagnosis not present

## 2020-08-02 DIAGNOSIS — Z7901 Long term (current) use of anticoagulants: Secondary | ICD-10-CM | POA: Diagnosis not present

## 2020-08-02 DIAGNOSIS — Z9981 Dependence on supplemental oxygen: Secondary | ICD-10-CM | POA: Diagnosis not present

## 2020-08-02 DIAGNOSIS — E785 Hyperlipidemia, unspecified: Secondary | ICD-10-CM | POA: Diagnosis not present

## 2020-08-02 DIAGNOSIS — Z433 Encounter for attention to colostomy: Secondary | ICD-10-CM | POA: Diagnosis not present

## 2020-08-03 ENCOUNTER — Other Ambulatory Visit: Payer: Self-pay

## 2020-08-03 ENCOUNTER — Ambulatory Visit (INDEPENDENT_AMBULATORY_CARE_PROVIDER_SITE_OTHER): Payer: Self-pay | Admitting: General Surgery

## 2020-08-03 ENCOUNTER — Encounter: Payer: Self-pay | Admitting: General Surgery

## 2020-08-03 VITALS — BP 142/75 | HR 97 | Temp 97.8°F | Resp 18 | Ht 64.0 in | Wt 254.0 lb

## 2020-08-03 DIAGNOSIS — K9409 Other complications of colostomy: Secondary | ICD-10-CM

## 2020-08-03 NOTE — Patient Instructions (Addendum)
Continue ostomy care and stool softeners.  Do your colonoscopy. The will look in the ostomy and in your rectum.

## 2020-08-03 NOTE — Progress Notes (Signed)
[facility]   HPI:  75 y.o. Female presents to clinic for follow-up evaluation of her ostomy that has some skin stenosis. She is doing great and is moving around better. She has regular stool from her ostomy. Colonoscopy at the end of the month is scheduled.   Review of Systems:  No fever or chills Regular BMs All other review of systems: otherwise negative   Vital Signs:  BP (!) 142/75   Pulse 97   Temp 97.8 F (36.6 C) (Other (Comment))   Resp 18   Ht 5\' 4"  (1.626 m)   Wt 254 lb (115.2 kg)   SpO2 94%   BMI 43.60 kg/m    Physical Exam:  Physical Exam Vitals reviewed.  Cardiovascular:     Rate and Rhythm: Normal rate.  Pulmonary:     Effort: Pulmonary effort is normal.  Abdominal:     Palpations: Abdomen is soft.     Comments: Healed midline, ostomy pink and dilated with my finger, liquid stool in bag  Neurological:     Mental Status: She is alert.      Assessment:  75 y.o. yo Female with ostomy in place after perforation. Colostomy with skin stenosis but stable and not worsening, dilation is working.  Plan:  Continue ostomy care and stool softeners.  Do your colonoscopy. The will look in the ostomy and in your rectum.   Future Appointments  Date Time Provider [place]  08/14/2020  1:20 PM Jamse Arn, MD CPR-PRMA CPR  08/16/2020 11:00 AM Cassandria Anger, MD REA-REA None  08/18/2020 10:45 AM AP-DOIBP PAT 1 AP-DOIBP None  08/18/2020 11:00 AM AP-DOIBP PAT 1 AP-DOIBP None  08/31/2020  1:00 PM Virl Cagey, MD RS-RS None  10/04/2020  1:20 PM Branch, Alphonse Guild, MD CVD-[facility] ANNIE [place], MD [facility] [address] [place], [place] [address] [phone] (office)

## 2020-08-09 ENCOUNTER — Other Ambulatory Visit: Payer: Self-pay

## 2020-08-09 ENCOUNTER — Emergency Department (HOSPITAL_COMMUNITY)
Admission: EM | Admit: 2020-08-09 | Discharge: 2020-08-09 | Disposition: A | Discharge status: Home / Self Care | Payer: Medicare Other | Attending: Physician Assistant | Admitting: Physician Assistant

## 2020-08-09 ENCOUNTER — Telehealth: Payer: Self-pay | Admitting: Internal Medicine

## 2020-08-09 ENCOUNTER — Encounter (HOSPITAL_COMMUNITY): Payer: Self-pay

## 2020-08-09 ENCOUNTER — Emergency Department (HOSPITAL_COMMUNITY): Payer: Medicare Other

## 2020-08-09 DIAGNOSIS — I517 Cardiomegaly: Secondary | ICD-10-CM | POA: Diagnosis not present

## 2020-08-09 DIAGNOSIS — Z5321 Procedure and treatment not carried out due to patient leaving prior to being seen by health care provider: Secondary | ICD-10-CM | POA: Diagnosis not present

## 2020-08-09 DIAGNOSIS — M7989 Other specified soft tissue disorders: Secondary | ICD-10-CM | POA: Insufficient documentation

## 2020-08-09 DIAGNOSIS — R0602 Shortness of breath: Secondary | ICD-10-CM | POA: Diagnosis not present

## 2020-08-09 LAB — CBC WITH DIFFERENTIAL/PLATELET
Abs Immature Granulocytes: 0.01 10*3/uL (ref 0.00–0.07)
Basophils Absolute: 0.1 10*3/uL (ref 0.0–0.1)
Basophils Relative: 1 %
Eosinophils Absolute: 0.6 10*3/uL — ABNORMAL HIGH (ref 0.0–0.5)
Eosinophils Relative: 7 %
HCT: 34.4 % — ABNORMAL LOW (ref 36.0–46.0)
Hemoglobin: 10.1 g/dL — ABNORMAL LOW (ref 12.0–15.0)
Immature Granulocytes: 0 %
Lymphocytes Relative: 29 %
Lymphs Abs: 2.2 10*3/uL (ref 0.7–4.0)
MCH: 24.9 pg — ABNORMAL LOW (ref 26.0–34.0)
MCHC: 29.4 g/dL — ABNORMAL LOW (ref 30.0–36.0)
MCV: 84.9 fL (ref 80.0–100.0)
Monocytes Absolute: 0.7 10*3/uL (ref 0.1–1.0)
Monocytes Relative: 10 %
Neutro Abs: 4.1 10*3/uL (ref 1.7–7.7)
Neutrophils Relative %: 53 %
Platelets: 249 10*3/uL (ref 150–400)
RBC: 4.05 MIL/uL (ref 3.87–5.11)
RDW: 15.8 % — ABNORMAL HIGH (ref 11.5–15.5)
WBC: 7.7 10*3/uL (ref 4.0–10.5)
nRBC: 0 % (ref 0.0–0.2)

## 2020-08-09 LAB — BASIC METABOLIC PANEL
Anion gap: 10 (ref 5–15)
BUN: 11 mg/dL (ref 8–23)
CO2: 26 mmol/L (ref 22–32)
Calcium: 8.8 mg/dL — ABNORMAL LOW (ref 8.9–10.3)
Chloride: 103 mmol/L (ref 98–111)
Creatinine, Ser: 0.75 mg/dL (ref 0.44–1.00)
GFR, Estimated: >60 mL/min (ref >60)
Glucose, Bld: 154 mg/dL — ABNORMAL HIGH (ref 70–99)
Potassium: 4 mmol/L (ref 3.5–5.1)
Sodium: 139 mmol/L (ref 135–145)

## 2020-08-09 LAB — BRAIN NATRIURETIC PEPTIDE: B Natriuretic Peptide: 251 pg/mL — ABNORMAL HIGH (ref 0.0–100.0)

## 2020-08-09 NOTE — ED Triage Notes (Signed)
Pt to er, pt states that Monday she started having some swelling in her bilateral lower extremities, states that she has some redness and warmth with the swelling, states that the swelling is decreased today because she has been elevating her legs.

## 2020-08-09 NOTE — Telephone Encounter (Signed)
Hi Martina, I actually plan on looking at her rectal stump as well so she will need to perform enema in her rectum on morning of procedure.  Thank you

## 2020-08-10 NOTE — Telephone Encounter (Signed)
Called pt, informed her of Dr. Ave Filter recommendation. She wants to know how she will do enema because she thought her rectum "was tied off". Advised her to take enema with her to hospital for assistance. Routing to Dr. Abbey Chatters for any further advice.

## 2020-08-11 DIAGNOSIS — K5732 Diverticulitis of large intestine without perforation or abscess without bleeding: Secondary | ICD-10-CM | POA: Diagnosis not present

## 2020-08-11 DIAGNOSIS — I130 Hypertensive heart and chronic kidney disease with heart failure and stage 1 through stage 4 chronic kidney disease, or unspecified chronic kidney disease: Secondary | ICD-10-CM | POA: Diagnosis not present

## 2020-08-11 DIAGNOSIS — N390 Urinary tract infection, site not specified: Secondary | ICD-10-CM | POA: Diagnosis not present

## 2020-08-11 DIAGNOSIS — Z433 Encounter for attention to colostomy: Secondary | ICD-10-CM | POA: Diagnosis not present

## 2020-08-11 DIAGNOSIS — E1165 Type 2 diabetes mellitus with hyperglycemia: Secondary | ICD-10-CM | POA: Diagnosis not present

## 2020-08-11 DIAGNOSIS — E1122 Type 2 diabetes mellitus with diabetic chronic kidney disease: Secondary | ICD-10-CM | POA: Diagnosis not present

## 2020-08-14 ENCOUNTER — Encounter: Payer: Medicare Other | Admitting: Physical Medicine & Rehabilitation

## 2020-08-14 DIAGNOSIS — J449 Chronic obstructive pulmonary disease, unspecified: Secondary | ICD-10-CM | POA: Diagnosis not present

## 2020-08-14 DIAGNOSIS — M1990 Unspecified osteoarthritis, unspecified site: Secondary | ICD-10-CM | POA: Diagnosis not present

## 2020-08-16 ENCOUNTER — Encounter: Payer: Self-pay | Admitting: "Endocrinology

## 2020-08-16 ENCOUNTER — Other Ambulatory Visit: Payer: Self-pay

## 2020-08-16 ENCOUNTER — Ambulatory Visit (INDEPENDENT_AMBULATORY_CARE_PROVIDER_SITE_OTHER): Payer: Medicare Other | Admitting: "Endocrinology

## 2020-08-16 VITALS — Ht 64.0 in | Wt 257.4 lb

## 2020-08-16 DIAGNOSIS — E782 Mixed hyperlipidemia: Secondary | ICD-10-CM | POA: Diagnosis not present

## 2020-08-16 DIAGNOSIS — E1165 Type 2 diabetes mellitus with hyperglycemia: Secondary | ICD-10-CM | POA: Diagnosis not present

## 2020-08-16 DIAGNOSIS — E039 Hypothyroidism, unspecified: Secondary | ICD-10-CM

## 2020-08-16 DIAGNOSIS — E2740 Unspecified adrenocortical insufficiency: Secondary | ICD-10-CM

## 2020-08-16 DIAGNOSIS — I10 Essential (primary) hypertension: Secondary | ICD-10-CM

## 2020-08-16 LAB — POCT GLYCOSYLATED HEMOGLOBIN (HGB A1C): HbA1c, POC (controlled diabetic range): 7.6 % — AB (ref 0.0–7.0)

## 2020-08-16 MED ORDER — LEVEMIR FLEXTOUCH 100 UNIT/ML ~~LOC~~ SOPN: 30.0000 [IU] | PEN_INJECTOR | Freq: Every day | SUBCUTANEOUS | 2 refills | Status: DC

## 2020-08-16 NOTE — Patient Instructions (Signed)
Ann [name]  08/16/2020     @PREFPERIOPPHARMACY @   Your procedure is scheduled on  08/19/2020   Report to [facility] at  [place]   Call this number if you have problems the morning of surgery:  [phone]   Remember:  Follow the diet and prep instructions given to you by the office.                 Take these medicines the morning of surgery with A SIP OF WATER  Diltiazem, nexium, gabapentin, levothyroxine, mirabegron, oxycodone (if needed).  Use your nebulizer and your inhaler before you come and bring your rescue inhaler with you.  Take 25 units of levemir the night before your procedure.  DO NOT take any medications for diabetes the morning of your procedure.  If your glucose is 70 or below the morning of your procedure, drink 1/2 cup of clear juice and recheck your glucose in 15 minutes. If your glucose is still 70 or below, call [phone] for instructions.  If your glucose is 300 or above the morning of your procedure, call [phone] for instructions.     Please brush your teeth.  Do not wear jewelry, make-up or nail polish.  Do not wear lotions, powders, or perfumes, or deodorant.  Do not shave 48 hours prior to surgery.  Men may shave face and neck.  Do not bring valuables to the hospital.  [facility] is not responsible for any belongings or valuables.   Contacts, dentures or bridgework may not be worn into surgery.  Leave your suitcase in the car.  After surgery it may be brought to your room.  For patients admitted to the hospital, discharge time will be determined by your treatment team.  Patients discharged the day of surgery will not be allowed to drive home and must have someone with them for 24 hours.   Special instructions:  DO NOT smoke tobacco or vape the morning of your procedure.   Please read over the following fact sheets that you were given. Anesthesia Post-op Instructions and Care and Recovery After  Surgery       Colonoscopy, Adult, Care After This sheet gives you information about how to care for yourself after your procedure. Your health care provider may also give you more specific instructions. If you have problems or questions, contact your health care provider. What can I expect after the procedure? After the procedure, it is common to have:  A small amount of blood in your stool for 24 hours after the procedure.  Some gas.  Mild cramping or bloating of your abdomen. Follow these instructions at home: Eating and drinking  Drink enough fluid to keep your urine pale yellow.  Follow instructions from your health care provider about eating or drinking restrictions.  Resume your normal diet as instructed by your health care provider. Avoid heavy or fried foods that are hard to digest.   Activity  Rest as told by your health care provider.  Avoid sitting for a long time without moving. Get up to take short walks every 1-2 hours. This is important to improve blood flow and breathing. Ask for help if you feel weak or unsteady.  Return to your normal activities as told by your health care provider. Ask your health care provider what activities are safe for you. Managing cramping and bloating  Try walking around when you have cramps or feel bloated.  Apply heat to  your abdomen as told by your health care provider. Use the heat source that your health care provider recommends, such as a moist heat pack or a heating pad. ? Place a towel between your skin and the heat source. ? Leave the heat on for 20-30 minutes. ? Remove the heat if your skin turns bright red. This is especially important if you are unable to feel pain, heat, or cold. You may have a greater risk of getting burned.   General instructions  If you were given a sedative during the procedure, it can affect you for several hours. Do not drive or operate machinery until your health care provider says that it is  safe.  For the first 24 hours after the procedure: ? Do not sign important documents. ? Do not drink alcohol. ? Do your regular daily activities at a slower pace than normal. ? Eat soft foods that are easy to digest.  Take over-the-counter and prescription medicines only as told by your health care provider.  Keep all follow-up visits as told by your health care provider. This is important. Contact a health care provider if:  You have blood in your stool 2-3 days after the procedure. Get help right away if you have:  More than a small spotting of blood in your stool.  Large blood clots in your stool.  Swelling of your abdomen.  Nausea or vomiting.  A fever.  Increasing pain in your abdomen that is not relieved with medicine. Summary  After the procedure, it is common to have a small amount of blood in your stool. You may also have mild cramping and bloating of your abdomen.  If you were given a sedative during the procedure, it can affect you for several hours. Do not drive or operate machinery until your health care provider says that it is safe.  Get help right away if you have a lot of blood in your stool, nausea or vomiting, a fever, or increased pain in your abdomen. This information is not intended to replace advice given to you by your health care provider. Make sure you discuss any questions you have with your health care provider. Document Revised: 05/07/2019 Document Reviewed: 12/07/2018 Elsevier Patient Education  2021 [employer] After This sheet gives you information about how to care for yourself after your procedure. Your health care provider may also give you more specific instructions. If you have problems or questions, contact your health care provider. What can I expect after the procedure? After the procedure, it is common to have:  Tiredness.  Forgetfulness about what happened after the procedure.  Impaired judgment  for important decisions.  Nausea or vomiting.  Some difficulty with balance. Follow these instructions at home: For the time period you were told by your health care provider:  Rest as needed.  Do not participate in activities where you could fall or become injured.  Do not drive or use machinery.  Do not drink alcohol.  Do not take sleeping pills or medicines that cause drowsiness.  Do not make important decisions or sign legal documents.  Do not take care of children on your own.      Eating and drinking  Follow the diet that is recommended by your health care provider.  Drink enough fluid to keep your urine pale yellow.  If you vomit: ? Drink water, juice, or soup when you can drink without vomiting. ? Make sure you have little or no nausea  before eating solid foods. General instructions  Have a responsible adult stay with you for the time you are told. It is important to have someone help care for you until you are awake and alert.  Take over-the-counter and prescription medicines only as told by your health care provider.  If you have sleep apnea, surgery and certain medicines can increase your risk for breathing problems. Follow instructions from your health care provider about wearing your sleep device: ? Anytime you are sleeping, including during daytime naps. ? While taking prescription pain medicines, sleeping medicines, or medicines that make you drowsy.  Avoid smoking.  Keep all follow-up visits as told by your health care provider. This is important. Contact a health care provider if:  You keep feeling nauseous or you keep vomiting.  You feel light-headed.  You are still sleepy or having trouble with balance after 24 hours.  You develop a rash.  You have a fever.  You have redness or swelling around the IV site. Get help right away if:  You have trouble breathing.  You have new-onset confusion at home. Summary  For several hours after your  procedure, you may feel tired. You may also be forgetful and have poor judgment.  Have a responsible adult stay with you for the time you are told. It is important to have someone help care for you until you are awake and alert.  Rest as told. Do not drive or operate machinery. Do not drink alcohol or take sleeping pills.  Get help right away if you have trouble breathing, or if you suddenly become confused. This information is not intended to replace advice given to you by your health care provider. Make sure you discuss any questions you have with your health care provider. Document Revised: 01/27/2020 Document Reviewed: 04/15/2019 Elsevier Patient Education  2021 [employer].

## 2020-08-16 NOTE — Patient Instructions (Signed)
                                     Advice for Weight Management  -For most of us the best way to lose weight is by diet management. Generally speaking, diet management means consuming less calories intentionally which over time brings about progressive weight loss.  This can be achieved more effectively by restricting carbohydrate consumption to the minimum possible.  So, it is critically important to know your numbers: how much calorie you are consuming and how much calorie you need. More importantly, our carbohydrates sources should be unprocessed or minimally processed complex starch food items.   Sometimes, it is important to balance nutrition by increasing protein intake (animal or plant source), fruits, and vegetables.  -Sticking to a routine mealtime to eat 3 meals a day and avoiding unnecessary snacks is shown to have a big role in weight control. Under normal circumstances, the only time we lose real weight is when we are hungry, so allow hunger to take place- hunger means no food between meal times, only water.  It is not advisable to starve.   -It is better to avoid simple carbohydrates including: Cakes, Sweet Desserts, Ice Cream, Soda (diet and regular), Sweet Tea, Candies, Chips, Cookies, Store Bought Juices, Alcohol in Excess of  1-2 drinks a day, Lemonade,  Artificial Sweeteners, Doughnuts, Coffee Creamers, "Sugar-free" Products, etc, etc.  This is not a complete list.....    -Consulting with certified diabetes educators is proven to provide you with the most accurate and current information on diet.  Also, you may be  interested in discussing diet options/exchanges , we can schedule a visit with Ann [name], RDN, CDE for individualized nutrition education.  -Exercise: If you are able: 30 -60 minutes a day ,4 days a week, or 150 minutes a week.  The longer the better.  Combine stretch, strength, and aerobic activities.  If you were told in the  past that you have high risk for cardiovascular diseases, you may seek evaluation by your heart doctor prior to initiating moderate to intense exercise programs.                                  Additional Care Considerations for Diabetes   -Diabetes  is a chronic disease.  The most important care consideration is regular follow-up with your diabetes care provider with the goal being avoiding or delaying its complications and to take advantage of advances in medications and technology.    -Type 2 diabetes is known to coexist with other important comorbidities such as high blood pressure and high cholesterol.  It is critical to control not only the diabetes but also the high blood pressure and high cholesterol to minimize and delay the risk of complications including coronary artery disease, stroke, amputations, blindness, etc.    - Studies showed that people with diabetes will benefit from a class of medications known as ACE inhibitors and statins.  Unless there are specific reasons not to be on these medications, the standard of care is to consider getting one from these groups of medications at an optimal doses.  These medications are generally considered safe and proven to help protect the heart and the kidneys.    - People with diabetes are encouraged to initiate and maintain regular follow-up with eye doctors, foot   doctors, dentists , and if necessary heart and kidney doctors.     - It is highly recommended that people with diabetes quit smoking or stay away from smoking, and get yearly  flu vaccine and pneumonia vaccine at least every 5 years.  One other important lifestyle recommendation is to ensure adequate sleep - at least 6-7 hours of uninterrupted sleep at night.  -Exercise: If you are able: 30 -60 minutes a day, 4 days a week, or 150 minutes a week.  The longer the better.  Combine stretch, strength, and aerobic activities.  If you were told in the past that you have high risk for  cardiovascular diseases, you may seek evaluation by your heart doctor prior to initiating moderate to intense exercise programs.          
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
## 2020-08-17 ENCOUNTER — Telehealth: Payer: Self-pay

## 2020-08-17 DIAGNOSIS — Z433 Encounter for attention to colostomy: Secondary | ICD-10-CM | POA: Diagnosis not present

## 2020-08-17 DIAGNOSIS — E1122 Type 2 diabetes mellitus with diabetic chronic kidney disease: Secondary | ICD-10-CM | POA: Diagnosis not present

## 2020-08-17 DIAGNOSIS — I130 Hypertensive heart and chronic kidney disease with heart failure and stage 1 through stage 4 chronic kidney disease, or unspecified chronic kidney disease: Secondary | ICD-10-CM | POA: Diagnosis not present

## 2020-08-17 DIAGNOSIS — K5732 Diverticulitis of large intestine without perforation or abscess without bleeding: Secondary | ICD-10-CM | POA: Diagnosis not present

## 2020-08-17 DIAGNOSIS — N390 Urinary tract infection, site not specified: Secondary | ICD-10-CM | POA: Diagnosis not present

## 2020-08-17 DIAGNOSIS — E1165 Type 2 diabetes mellitus with hyperglycemia: Secondary | ICD-10-CM | POA: Diagnosis not present

## 2020-08-17 NOTE — Telephone Encounter (Signed)
11AM: Palliative care SW outreached patient for monthly telephonic visit.  SW left HIPPA complaint VM. Awaiting return call.  Will continue to offer palliative care support.

## 2020-08-18 ENCOUNTER — Other Ambulatory Visit (HOSPITAL_COMMUNITY)
Admission: RE | Admit: 2020-08-18 | Discharge: 2020-08-18 | Disposition: A | Discharge status: Home / Self Care | Payer: Medicare Other | Source: Ambulatory Visit | Attending: Internal Medicine | Admitting: Internal Medicine

## 2020-08-18 ENCOUNTER — Other Ambulatory Visit: Payer: Self-pay

## 2020-08-18 ENCOUNTER — Telehealth: Payer: Self-pay

## 2020-08-18 ENCOUNTER — Encounter (HOSPITAL_COMMUNITY): Payer: Self-pay

## 2020-08-18 ENCOUNTER — Encounter (HOSPITAL_COMMUNITY)
Admission: RE | Admit: 2020-08-18 | Discharge: 2020-08-18 | Disposition: A | Discharge status: Home / Self Care | Payer: Medicare Other | Source: Ambulatory Visit | Attending: Internal Medicine | Admitting: Internal Medicine

## 2020-08-18 DIAGNOSIS — Z20822 Contact with and (suspected) exposure to covid-19: Secondary | ICD-10-CM | POA: Insufficient documentation

## 2020-08-18 DIAGNOSIS — Z01812 Encounter for preprocedural laboratory examination: Secondary | ICD-10-CM | POA: Insufficient documentation

## 2020-08-18 HISTORY — DX: Sleep apnea, unspecified: G47.30

## 2020-08-18 NOTE — Telephone Encounter (Signed)
1PM: Palliative care SW outreached patient for monthly telephonic visit.  SW left HIPPA complaint VM. Awaiting return call.  Will continue to offer palliative care support.

## 2020-08-19 LAB — SARS CORONAVIRUS 2 (TAT 6-24 HRS): SARS Coronavirus 2: NEGATIVE

## 2020-08-22 ENCOUNTER — Ambulatory Visit (HOSPITAL_COMMUNITY): Payer: Medicare Other | Admitting: Anesthesiology

## 2020-08-22 ENCOUNTER — Encounter (HOSPITAL_COMMUNITY)
Admission: RE | Disposition: A | Discharge status: Home / Self Care | Payer: Self-pay | Source: Home / Self Care | Attending: Internal Medicine

## 2020-08-22 ENCOUNTER — Ambulatory Visit (HOSPITAL_COMMUNITY)
Admission: RE | Admit: 2020-08-22 | Discharge: 2020-08-22 | Disposition: A | Discharge status: Home / Self Care | Payer: Medicare Other | Attending: Internal Medicine | Admitting: Internal Medicine

## 2020-08-22 ENCOUNTER — Encounter (HOSPITAL_COMMUNITY): Payer: Self-pay

## 2020-08-22 DIAGNOSIS — Z79890 Hormone replacement therapy (postmenopausal): Secondary | ICD-10-CM | POA: Insufficient documentation

## 2020-08-22 DIAGNOSIS — Z09 Encounter for follow-up examination after completed treatment for conditions other than malignant neoplasm: Secondary | ICD-10-CM | POA: Diagnosis not present

## 2020-08-22 DIAGNOSIS — Z9981 Dependence on supplemental oxygen: Secondary | ICD-10-CM | POA: Diagnosis not present

## 2020-08-22 DIAGNOSIS — Z7901 Long term (current) use of anticoagulants: Secondary | ICD-10-CM | POA: Diagnosis not present

## 2020-08-22 DIAGNOSIS — Z886 Allergy status to analgesic agent status: Secondary | ICD-10-CM | POA: Diagnosis not present

## 2020-08-22 DIAGNOSIS — Z888 Allergy status to other drugs, medicaments and biological substances status: Secondary | ICD-10-CM | POA: Diagnosis not present

## 2020-08-22 DIAGNOSIS — I4891 Unspecified atrial fibrillation: Secondary | ICD-10-CM | POA: Diagnosis not present

## 2020-08-22 DIAGNOSIS — Z955 Presence of coronary angioplasty implant and graft: Secondary | ICD-10-CM | POA: Diagnosis not present

## 2020-08-22 DIAGNOSIS — Z8719 Personal history of other diseases of the digestive system: Secondary | ICD-10-CM | POA: Diagnosis not present

## 2020-08-22 DIAGNOSIS — Z9049 Acquired absence of other specified parts of digestive tract: Secondary | ICD-10-CM | POA: Diagnosis not present

## 2020-08-22 DIAGNOSIS — D128 Benign neoplasm of rectum: Secondary | ICD-10-CM | POA: Diagnosis not present

## 2020-08-22 DIAGNOSIS — Z794 Long term (current) use of insulin: Secondary | ICD-10-CM | POA: Diagnosis not present

## 2020-08-22 DIAGNOSIS — Z79899 Other long term (current) drug therapy: Secondary | ICD-10-CM | POA: Diagnosis not present

## 2020-08-22 DIAGNOSIS — D123 Benign neoplasm of transverse colon: Secondary | ICD-10-CM | POA: Diagnosis not present

## 2020-08-22 DIAGNOSIS — K635 Polyp of colon: Secondary | ICD-10-CM

## 2020-08-22 DIAGNOSIS — D122 Benign neoplasm of ascending colon: Secondary | ICD-10-CM | POA: Insufficient documentation

## 2020-08-22 DIAGNOSIS — Z87891 Personal history of nicotine dependence: Secondary | ICD-10-CM | POA: Insufficient documentation

## 2020-08-22 DIAGNOSIS — K5732 Diverticulitis of large intestine without perforation or abscess without bleeding: Secondary | ICD-10-CM | POA: Diagnosis not present

## 2020-08-22 DIAGNOSIS — K621 Rectal polyp: Secondary | ICD-10-CM

## 2020-08-22 HISTORY — PX: POLYPECTOMY: SHX5525

## 2020-08-22 HISTORY — PX: COLONOSCOPY WITH PROPOFOL: SHX5780

## 2020-08-22 HISTORY — PX: BIOPSY: SHX5522

## 2020-08-22 LAB — GLUCOSE, CAPILLARY
Glucose-Capillary: 164 mg/dL — ABNORMAL HIGH (ref 70–99)
Glucose-Capillary: 172 mg/dL — ABNORMAL HIGH (ref 70–99)

## 2020-08-22 SURGERY — COLONOSCOPY WITH PROPOFOL
Anesthesia: General

## 2020-08-22 MED ORDER — SODIUM CHLORIDE 0.9 % IV SOLN: INTRAVENOUS | Status: DC | PRN

## 2020-08-22 MED ORDER — PROPOFOL 500 MG/50ML IV EMUL
INTRAVENOUS | Status: DC | PRN
  Administered 2020-08-22: 100 ug/kg/min via INTRAVENOUS

## 2020-08-22 MED ORDER — LACTATED RINGERS IV SOLN: INTRAVENOUS | Status: DC

## 2020-08-22 MED ORDER — DEXAMETHASONE SODIUM PHOSPHATE 4 MG/ML IJ SOLN
4.0000 mg | Freq: Once | INTRAMUSCULAR | Status: AC
  Administered 2020-08-22: 4 mg via INTRAVENOUS

## 2020-08-22 MED ORDER — PROPOFOL 10 MG/ML IV BOLUS
INTRAVENOUS | Status: DC | PRN
  Administered 2020-08-22: 80 mg via INTRAVENOUS
  Administered 2020-08-22: 20 mg via INTRAVENOUS

## 2020-08-22 MED ORDER — DEXAMETHASONE SODIUM PHOSPHATE 4 MG/ML IJ SOLN
INTRAMUSCULAR | Status: AC
  Filled 2020-08-22: qty 1

## 2020-08-22 NOTE — Discharge Instructions (Signed)
  Colonoscopy Discharge Instructions  Read the instructions outlined below and refer to this sheet in the next few weeks. These discharge instructions provide you with general information on caring for yourself after you leave the hospital. Your doctor may also give you specific instructions. While your treatment has been planned according to the most current medical practices available, unavoidable complications occasionally occur.   ACTIVITY  You may resume your regular activity, but move at a slower pace for the next 24 hours.   Take frequent rest periods for the next 24 hours.   Walking will help get rid of the air and reduce the bloated feeling in your belly (abdomen).   No driving for 24 hours (because of the medicine (anesthesia) used during the test).    Do not sign any important legal documents or operate any machinery for 24 hours (because of the anesthesia used during the test).  NUTRITION  Drink plenty of fluids.   You may resume your normal diet as instructed by your doctor.   Begin with a light meal and progress to your normal diet. Heavy or fried foods are harder to digest and may make you feel sick to your stomach (nauseated).   Avoid alcoholic beverages for 24 hours or as instructed.  MEDICATIONS  You may resume your normal medications unless your doctor tells you otherwise.  WHAT YOU CAN EXPECT TODAY  Some feelings of bloating in the abdomen.   Passage of more gas than usual.   Spotting of blood in your stool or on the toilet paper.  IF YOU HAD POLYPS REMOVED DURING THE COLONOSCOPY:  No aspirin products for 7 days or as instructed.   No alcohol for 7 days or as instructed.   Eat a soft diet for the next 24 hours.  FINDING OUT THE RESULTS OF YOUR TEST Not all test results are available during your visit. If your test results are not back during the visit, make an appointment with your caregiver to find out the results. Do not assume everything is normal if  you have not heard from your caregiver or the medical facility. It is important for you to follow up on all of your test results.  SEEK IMMEDIATE MEDICAL ATTENTION IF:  You have more than a spotting of blood in your stool.   Your belly is swollen (abdominal distention).   You are nauseated or vomiting.   You have a temperature over 101.   You have abdominal pain or discomfort that is severe or gets worse throughout the day.   Your colonoscopy revealed 12 polyp(s) which I removed successfully. Await pathology results, my office will contact you. I recommend repeating colonoscopy in 1 years for surveillance purposes. Follow up with surgery as previously scheduled. Follow up with GI as needed,    I hope you have a great rest of your week!  Elon Alas. Abbey Chatters, D.O. Gastroenterology and Hepatology [facility]

## 2020-08-22 NOTE — Transfer of Care (Signed)
Immediate Anesthesia Transfer of Care Note  Patient: Ann [name]  Procedure(s) Performed: COLONOSCOPY WITH PROPOFOL (N/A ) POLYPECTOMY BIOPSY  Patient Location: PACU  Anesthesia Type:General  Level of Consciousness: awake and alert   Airway & Oxygen Therapy: Patient Spontanous Breathing and Patient connected to nasal cannula oxygen  Post-op Assessment: Report given to RN and Post -op Vital signs reviewed and stable  Post vital signs: Reviewed and stable  Last Vitals:  Vitals Value Taken Time  BP    Temp 97.7   Pulse    Resp 29 08/22/20 0916  SpO2 98%   Vitals shown include unvalidated device data.  Last Pain:  Vitals:   08/22/20 0838  PainSc: 3          Complications: No complications documented.

## 2020-08-22 NOTE — Op Note (Signed)
[facility] Patient Name: Ann [name] Procedure Date: 08/22/2020 8:24 AM MRN: [id number] Date of Birth: 18-May-1946 Attending MD: Elon Alas. Abbey Chatters DO CSN: [id number] Age: 75 Admit Type: Outpatient Procedure:                Colonoscopy Indications:              Follow-up of diverticulitis Providers:                Elon Alas. Abbey Chatters, DO, Janeece Riggers, RN, Aram Candela Referring MD:              Medicines:                See the Anesthesia note for documentation of the                            administered medications Complications:            No immediate complications. Estimated Blood Loss:     Estimated blood loss was minimal. Procedure:                Pre-Anesthesia Assessment:                           - The anesthesia plan was to use monitored                            anesthesia care (MAC).                           After obtaining informed consent, the colonoscope                            was passed under direct vision. Throughout the                            procedure, the patient's blood pressure, pulse, and                            oxygen saturations were monitored continuously. The                            [id number]) was introduced through the anus                            and advanced to the the cecum, identified by                            appendiceal orifice and ileocecal valve. The                            colonoscopy was performed without difficulty. The                            patient tolerated the procedure well. The quality                            of the bowel preparation  was evaluated using the                            BBPS [facility] Bowel Preparation Scale) with scores                            of: Right Colon = 3, Transverse Colon = 3 and Left                            Colon = 3 (entire mucosa seen well with no residual                            staining, small fragments of stool or opaque                             liquid). The total BBPS score equals 9. Scope In: 8:39:06 AM Scope Out: 9:10:25 AM Scope Withdrawal Time: 0 hours 17 minutes 59 seconds  Total Procedure Duration: 0 hours 31 minutes 19 seconds  Findings:      The perianal and digital rectal examinations were normal.      A 14 mm polyp was found in the rectum stump. The polyp was pedunculated.       The polyp was removed with a hot snare. Resection and retrieval were       complete.      A 2 mm polyp was found in the ascending colon. The polyp was sessile.       The polyp was removed with a cold biopsy forceps. Resection and       retrieval were complete.      A 5 mm polyp was found in the ascending colon. The polyp was sessile.       The polyp was removed with a cold snare. Resection and retrieval were       complete.      Nine sessile polyps were found in the transverse colon. The polyps were       5 to 9 mm in size. These polyps were removed with a cold snare.       Resection and retrieval were complete. Impression:               - One 14 mm polyp in the rectum, removed with a hot                            snare. Resected and retrieved.                           - One 2 mm polyp in the ascending colon, removed                            with a cold biopsy forceps. Resected and retrieved.                           - One 5 mm polyp in the ascending colon, removed                            with a cold  snare. Resected and retrieved.                           - Nine 5 to 9 mm polyps in the transverse colon,                            removed with a cold snare. Resected and retrieved. Moderate Sedation:      Per Anesthesia Care      Per Anesthesia Care Recommendation:           - Patient has a contact number available for                            emergencies. The signs and symptoms of potential                            delayed complications were discussed with the                            patient. Return to normal activities  tomorrow.                            Written discharge instructions were provided to the                            patient.                           - Resume previous diet.                           - Continue present medications.                           - Await pathology results.                           - Repeat colonoscopy in 1 year for surveillance.                           - Return to GI clinic PRN.                           - Follow up with surgery as previously scheduled. Procedure Code(s):        --- Professional ---                           [phone], Colonoscopy, flexible; with removal of                            tumor(s), polyp(s), or other lesion(s) by snare                            technique                           36144, 65,  Colonoscopy, flexible; with biopsy,                            single or multiple Diagnosis Code(s):        --- Professional ---                           K62.1, Rectal polyp                           K63.5, Polyp of colon                           K57.32, Diverticulitis of large intestine without                            perforation or abscess without bleeding CPT copyright 2019 American Medical Association. All rights reserved. The codes documented in this report are preliminary and upon coder review may  be revised to meet current compliance requirements. Elon Alas. Abbey Chatters, DO [place] Abbey Chatters, DO 08/22/2020 9:21:29 AM This report has been signed electronically. Number of Addenda: 0

## 2020-08-22 NOTE — Anesthesia Preprocedure Evaluation (Signed)
Anesthesia Evaluation  Patient identified by MRN, date of birth, ID band Patient awake    Reviewed: Allergy & Precautions, NPO status , Patient's Chart, lab work & pertinent test results  History of Anesthesia Complications Negative for: history of anesthetic complications  Airway Mallampati: III  TM Distance: >3 FB Neck ROM: Full    Dental  (+) Edentulous Upper, Edentulous Lower   Pulmonary sleep apnea , COPD,  oxygen dependent, former smoker,  On 4 L of oxygen,    Pulmonary exam normal breath sounds clear to auscultation       Cardiovascular Exercise Tolerance: Poor hypertension, Pt. on medications + angina with exertion + CAD, + Cardiac Stents and +CHF  + dysrhythmias Atrial Fibrillation  Rhythm:Irregular Rate:Tachycardia  1. Left ventricular ejection fraction, by estimation, is 60 to 65%. The left ventricle has normal function. The left ventricle has no regional wall motion abnormalities. There is moderate left ventricular hypertrophy.  Left ventricular diastolic parameters are indeterminate.  2. Right ventricular systolic function is normal. The right ventricular size is normal.  3. Left atrial size was mildly dilated.  4. The mitral valve is normal in structure. No evidence of mitral valve regurgitation. No evidence of mitral stenosis.  5. The aortic valve has an indeterminant number of cusps. Aortic valve regurgitation is not visualized. No aortic stenosis is present.  6. The inferior vena cava is normal in size with greater than 50%  respiratory variability, suggesting right atrial pressure of 3 mmHg.  14-May-2020 11:44:48 [facility] ROUTINE RECORD Atrial fibrillation Low voltage, extremity and precordial leads Non-specific ST-t changes Confirmed by Lajean Saver [phone]) on 05/14/2020 11:52:08 AM    Neuro/Psych Anxiety  Neuromuscular disease    GI/Hepatic Neg liver ROS, hiatal hernia, PUD,  GERD  Medicated and Controlled,  Endo/Other  diabetes, Well Controlled, Type 2, Insulin DependentHypothyroidism Morbid obesityAdrenal insufficiency   Renal/GU Renal disease     Musculoskeletal  (+) Arthritis ,   Abdominal   Peds  Hematology  (+) anemia ,   Anesthesia Other Findings   Reproductive/Obstetrics negative OB ROS                             Anesthesia Physical  Anesthesia Plan  ASA: IV  Anesthesia Plan: General   Post-op Pain Management:    Induction: Intravenous  PONV Risk Score and Plan: Propofol infusion  Airway Management Planned: Nasal Cannula and Natural Airway  Additional Equipment:   Intra-op Plan:   Post-operative Plan: Possible Post-op intubation/ventilation  Informed Consent:    Discussed DNR with patient and Suspend DNR.     Plan Discussed with: Surgeon and CRNA  Anesthesia Plan Comments: (stress dose steroids)        Anesthesia Quick Evaluation
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
## 2020-08-22 NOTE — Anesthesia Postprocedure Evaluation (Signed)
Anesthesia Post Note  Patient: Ann [name]  Procedure(s) Performed: COLONOSCOPY WITH PROPOFOL (N/A ) POLYPECTOMY BIOPSY  Patient location during evaluation: PACU Anesthesia Type: General Level of consciousness: awake and alert Pain management: satisfactory to patient Vital Signs Assessment: post-procedure vital signs reviewed and stable Respiratory status: spontaneous breathing, respiratory function stable and patient connected to nasal cannula oxygen (Home O2 2L ) Cardiovascular status: blood pressure returned to baseline Postop Assessment: no apparent nausea or vomiting Anesthetic complications: no   No complications documented.   Last Vitals:  Vitals:   08/22/20 0916  BP: (!) 136/105  Resp: (!) 29    Last Pain:  Vitals:   08/22/20 0838  PainSc: 3                  Karna Dupes

## 2020-08-24 DIAGNOSIS — E1122 Type 2 diabetes mellitus with diabetic chronic kidney disease: Secondary | ICD-10-CM | POA: Diagnosis not present

## 2020-08-24 DIAGNOSIS — N390 Urinary tract infection, site not specified: Secondary | ICD-10-CM | POA: Diagnosis not present

## 2020-08-24 DIAGNOSIS — I130 Hypertensive heart and chronic kidney disease with heart failure and stage 1 through stage 4 chronic kidney disease, or unspecified chronic kidney disease: Secondary | ICD-10-CM | POA: Diagnosis not present

## 2020-08-24 DIAGNOSIS — Z433 Encounter for attention to colostomy: Secondary | ICD-10-CM | POA: Diagnosis not present

## 2020-08-24 DIAGNOSIS — K5732 Diverticulitis of large intestine without perforation or abscess without bleeding: Secondary | ICD-10-CM | POA: Diagnosis not present

## 2020-08-24 DIAGNOSIS — E1165 Type 2 diabetes mellitus with hyperglycemia: Secondary | ICD-10-CM | POA: Diagnosis not present

## 2020-08-24 LAB — SURGICAL PATHOLOGY

## 2020-08-28 ENCOUNTER — Encounter (HOSPITAL_COMMUNITY): Payer: Self-pay | Admitting: Internal Medicine

## 2020-08-31 ENCOUNTER — Other Ambulatory Visit: Payer: Self-pay

## 2020-08-31 ENCOUNTER — Encounter: Payer: Self-pay | Admitting: General Surgery

## 2020-08-31 ENCOUNTER — Ambulatory Visit (INDEPENDENT_AMBULATORY_CARE_PROVIDER_SITE_OTHER): Payer: Medicare Other | Admitting: General Surgery

## 2020-08-31 VITALS — BP 134/77 | HR 82 | Temp 97.9°F | Resp 18 | Ht 64.0 in | Wt 254.0 lb

## 2020-08-31 DIAGNOSIS — I130 Hypertensive heart and chronic kidney disease with heart failure and stage 1 through stage 4 chronic kidney disease, or unspecified chronic kidney disease: Secondary | ICD-10-CM | POA: Diagnosis not present

## 2020-08-31 DIAGNOSIS — Z933 Colostomy status: Secondary | ICD-10-CM

## 2020-08-31 DIAGNOSIS — E1122 Type 2 diabetes mellitus with diabetic chronic kidney disease: Secondary | ICD-10-CM | POA: Diagnosis not present

## 2020-08-31 DIAGNOSIS — E1165 Type 2 diabetes mellitus with hyperglycemia: Secondary | ICD-10-CM | POA: Diagnosis not present

## 2020-08-31 DIAGNOSIS — K9409 Other complications of colostomy: Secondary | ICD-10-CM | POA: Diagnosis not present

## 2020-08-31 DIAGNOSIS — N390 Urinary tract infection, site not specified: Secondary | ICD-10-CM | POA: Diagnosis not present

## 2020-08-31 DIAGNOSIS — Z433 Encounter for attention to colostomy: Secondary | ICD-10-CM | POA: Diagnosis not present

## 2020-08-31 DIAGNOSIS — K5732 Diverticulitis of large intestine without perforation or abscess without bleeding: Secondary | ICD-10-CM | POA: Diagnosis not present

## 2020-08-31 MED ORDER — OXYCODONE HCL 5 MG PO TABS: 5.0000 mg | ORAL_TABLET | Freq: Three times a day (TID) | ORAL | 0 refills | Status: DC | PRN

## 2020-08-31 NOTE — Patient Instructions (Signed)
See Dr. Harl Bowie. Will follow up next month to discuss reversal.

## 2020-08-31 NOTE — Progress Notes (Signed)
[facility] Note   HPI:  75 y.o. Female presents to clinic for follow-up evaluation of her end colostomy. Colonoscopy done and polyps removed. No malignancy noted. Continues to keep stools regular and soft.   Review of Systems:  No pain No chest pain or SOB  All other review of systems: otherwise negative   Vital Signs:  BP 134/77   Pulse 82   Temp 97.9 F (36.6 C) (Other (Comment))   Resp 18   Ht 5\' 4"  (1.626 m)   Wt 254 lb (115.2 kg)   SpO2 90%   BMI 43.60 kg/m    Physical Exam:  Physical Exam Vitals reviewed.  Constitutional:      Appearance: She is obese.  Cardiovascular:     Rate and Rhythm: Normal rate.  Pulmonary:     Effort: Pulmonary effort is normal.  Abdominal:     General: There is no distension.     Palpations: Abdomen is soft.     Tenderness: There is no abdominal tenderness.     Comments: Ostomy open, digital dilation done and liquid stool in bag, midline healing  Skin:    General: Skin is warm.  Neurological:     General: No focal deficit present.     Mental Status: She is alert and oriented to person, place, and time.    Pathology Colonoscopy: FINAL MICROSCOPIC DIAGNOSIS:   A. RECTAL STUMP, POLYPECTOMY:  - Tubulovillous adenoma.  - Negative for high grade dysplasia.   B. ASCENDING COLON, POLYPECTOMY:  - Tubular adenoma (X2).  - Negative for high grade dysplasia.   C. TRANSVERSE COLON, POLYPECTOMY:  - Tubular adenoma (X multiple).  - Negative for high grade dysplasia.   Assessment:  75 y.o. yo Female with end colostomy in place for perforated diverticulitis. Colonoscopy completed, has some colostomy stricture but managed with dilation at this time.  Plan:  See Dr. Harl Bowie. Will follow up next month to discuss reversal.   Future Appointments  Date Time Provider [place]  10/04/2020  1:20 PM Branch, Alphonse Guild, MD [facility] [facility]  10/05/2020  1:15 PM Virl Cagey, MD [facility] None  11/16/2020  1:30  PM Nida, Marella Chimes, MD [facility] None     Curlene Labrum, MD [facility] [address] [place], [place] [address] [phone] (office)

## 2020-09-01 DIAGNOSIS — N189 Chronic kidney disease, unspecified: Secondary | ICD-10-CM | POA: Diagnosis not present

## 2020-09-01 DIAGNOSIS — K5732 Diverticulitis of large intestine without perforation or abscess without bleeding: Secondary | ICD-10-CM | POA: Diagnosis not present

## 2020-09-01 DIAGNOSIS — E785 Hyperlipidemia, unspecified: Secondary | ICD-10-CM | POA: Diagnosis not present

## 2020-09-01 DIAGNOSIS — E538 Deficiency of other specified B group vitamins: Secondary | ICD-10-CM | POA: Diagnosis not present

## 2020-09-01 DIAGNOSIS — E039 Hypothyroidism, unspecified: Secondary | ICD-10-CM | POA: Diagnosis not present

## 2020-09-01 DIAGNOSIS — Z433 Encounter for attention to colostomy: Secondary | ICD-10-CM | POA: Diagnosis not present

## 2020-09-01 DIAGNOSIS — E2740 Unspecified adrenocortical insufficiency: Secondary | ICD-10-CM | POA: Diagnosis not present

## 2020-09-01 DIAGNOSIS — E1142 Type 2 diabetes mellitus with diabetic polyneuropathy: Secondary | ICD-10-CM | POA: Diagnosis not present

## 2020-09-01 DIAGNOSIS — Z6841 Body Mass Index (BMI) 40.0 and over, adult: Secondary | ICD-10-CM | POA: Diagnosis not present

## 2020-09-01 DIAGNOSIS — I2510 Atherosclerotic heart disease of native coronary artery without angina pectoris: Secondary | ICD-10-CM | POA: Diagnosis not present

## 2020-09-01 DIAGNOSIS — N390 Urinary tract infection, site not specified: Secondary | ICD-10-CM | POA: Diagnosis not present

## 2020-09-01 DIAGNOSIS — E46 Unspecified protein-calorie malnutrition: Secondary | ICD-10-CM | POA: Diagnosis not present

## 2020-09-01 DIAGNOSIS — Z9981 Dependence on supplemental oxygen: Secondary | ICD-10-CM | POA: Diagnosis not present

## 2020-09-01 DIAGNOSIS — I480 Paroxysmal atrial fibrillation: Secondary | ICD-10-CM | POA: Diagnosis not present

## 2020-09-01 DIAGNOSIS — Z79891 Long term (current) use of opiate analgesic: Secondary | ICD-10-CM | POA: Diagnosis not present

## 2020-09-01 DIAGNOSIS — E1122 Type 2 diabetes mellitus with diabetic chronic kidney disease: Secondary | ICD-10-CM | POA: Diagnosis not present

## 2020-09-01 DIAGNOSIS — K219 Gastro-esophageal reflux disease without esophagitis: Secondary | ICD-10-CM | POA: Diagnosis not present

## 2020-09-01 DIAGNOSIS — Z7901 Long term (current) use of anticoagulants: Secondary | ICD-10-CM | POA: Diagnosis not present

## 2020-09-01 DIAGNOSIS — E662 Morbid (severe) obesity with alveolar hypoventilation: Secondary | ICD-10-CM | POA: Diagnosis not present

## 2020-09-01 DIAGNOSIS — Z794 Long term (current) use of insulin: Secondary | ICD-10-CM | POA: Diagnosis not present

## 2020-09-01 DIAGNOSIS — I130 Hypertensive heart and chronic kidney disease with heart failure and stage 1 through stage 4 chronic kidney disease, or unspecified chronic kidney disease: Secondary | ICD-10-CM | POA: Diagnosis not present

## 2020-09-01 DIAGNOSIS — J449 Chronic obstructive pulmonary disease, unspecified: Secondary | ICD-10-CM | POA: Diagnosis not present

## 2020-09-01 DIAGNOSIS — E8809 Other disorders of plasma-protein metabolism, not elsewhere classified: Secondary | ICD-10-CM | POA: Diagnosis not present

## 2020-09-01 DIAGNOSIS — E1165 Type 2 diabetes mellitus with hyperglycemia: Secondary | ICD-10-CM | POA: Diagnosis not present

## 2020-09-04 ENCOUNTER — Telehealth: Payer: Self-pay

## 2020-09-04 NOTE — Telephone Encounter (Signed)
1129 am.  Phone call made to patient to offer a home visit or complete a telephonic visit.  No answer but a message has been left requesting a call back.  PLAN:  Awaiting call back.  If no call back, I will outreach patient at a later date.

## 2020-09-05 DIAGNOSIS — I130 Hypertensive heart and chronic kidney disease with heart failure and stage 1 through stage 4 chronic kidney disease, or unspecified chronic kidney disease: Secondary | ICD-10-CM | POA: Diagnosis not present

## 2020-09-05 DIAGNOSIS — K5732 Diverticulitis of large intestine without perforation or abscess without bleeding: Secondary | ICD-10-CM | POA: Diagnosis not present

## 2020-09-05 DIAGNOSIS — E1122 Type 2 diabetes mellitus with diabetic chronic kidney disease: Secondary | ICD-10-CM | POA: Diagnosis not present

## 2020-09-05 DIAGNOSIS — E1165 Type 2 diabetes mellitus with hyperglycemia: Secondary | ICD-10-CM | POA: Diagnosis not present

## 2020-09-05 DIAGNOSIS — N390 Urinary tract infection, site not specified: Secondary | ICD-10-CM | POA: Diagnosis not present

## 2020-09-05 DIAGNOSIS — Z433 Encounter for attention to colostomy: Secondary | ICD-10-CM | POA: Diagnosis not present

## 2020-09-11 ENCOUNTER — Telehealth: Payer: Self-pay | Admitting: Cardiology

## 2020-09-11 NOTE — Telephone Encounter (Signed)
Received message from Dr Constance Haw, will need cardiac clearance for possible reversal of colstomy at our next appt   Zandra Abts MD

## 2020-09-12 DIAGNOSIS — K219 Gastro-esophageal reflux disease without esophagitis: Secondary | ICD-10-CM | POA: Diagnosis not present

## 2020-09-12 DIAGNOSIS — K5732 Diverticulitis of large intestine without perforation or abscess without bleeding: Secondary | ICD-10-CM | POA: Diagnosis not present

## 2020-09-12 DIAGNOSIS — Z933 Colostomy status: Secondary | ICD-10-CM | POA: Diagnosis not present

## 2020-09-12 DIAGNOSIS — J449 Chronic obstructive pulmonary disease, unspecified: Secondary | ICD-10-CM | POA: Diagnosis not present

## 2020-09-12 DIAGNOSIS — N390 Urinary tract infection, site not specified: Secondary | ICD-10-CM | POA: Diagnosis not present

## 2020-09-12 DIAGNOSIS — Z433 Encounter for attention to colostomy: Secondary | ICD-10-CM | POA: Diagnosis not present

## 2020-09-12 DIAGNOSIS — E1165 Type 2 diabetes mellitus with hyperglycemia: Secondary | ICD-10-CM | POA: Diagnosis not present

## 2020-09-12 DIAGNOSIS — I130 Hypertensive heart and chronic kidney disease with heart failure and stage 1 through stage 4 chronic kidney disease, or unspecified chronic kidney disease: Secondary | ICD-10-CM | POA: Diagnosis not present

## 2020-09-12 DIAGNOSIS — E1122 Type 2 diabetes mellitus with diabetic chronic kidney disease: Secondary | ICD-10-CM | POA: Diagnosis not present

## 2020-09-12 DIAGNOSIS — E1159 Type 2 diabetes mellitus with other circulatory complications: Secondary | ICD-10-CM | POA: Diagnosis not present

## 2020-09-13 ENCOUNTER — Telehealth: Payer: Self-pay

## 2020-09-13 NOTE — Telephone Encounter (Signed)
Noted  

## 2020-09-13 NOTE — Telephone Encounter (Signed)
Mariann Laster is calling from [place] will be re faxing over this pt's forms to be re filled out for diabetic shoes. The forms were initially filled out in June but for some reason this pt could never get her shoes. She is asking if this could please be re filled out. She is going to resend the original form and a blank one

## 2020-09-15 DIAGNOSIS — I872 Venous insufficiency (chronic) (peripheral): Secondary | ICD-10-CM | POA: Diagnosis not present

## 2020-09-19 DIAGNOSIS — Z433 Encounter for attention to colostomy: Secondary | ICD-10-CM | POA: Diagnosis not present

## 2020-09-19 DIAGNOSIS — E1122 Type 2 diabetes mellitus with diabetic chronic kidney disease: Secondary | ICD-10-CM | POA: Diagnosis not present

## 2020-09-19 DIAGNOSIS — E1165 Type 2 diabetes mellitus with hyperglycemia: Secondary | ICD-10-CM | POA: Diagnosis not present

## 2020-09-19 DIAGNOSIS — I130 Hypertensive heart and chronic kidney disease with heart failure and stage 1 through stage 4 chronic kidney disease, or unspecified chronic kidney disease: Secondary | ICD-10-CM | POA: Diagnosis not present

## 2020-09-19 DIAGNOSIS — K5732 Diverticulitis of large intestine without perforation or abscess without bleeding: Secondary | ICD-10-CM | POA: Diagnosis not present

## 2020-09-19 DIAGNOSIS — N390 Urinary tract infection, site not specified: Secondary | ICD-10-CM | POA: Diagnosis not present

## 2020-09-19 NOTE — Telephone Encounter (Signed)
Talked to [place] from [place], she is faxing a new form for pt's diabetic shoes.

## 2020-09-19 NOTE — Telephone Encounter (Signed)
Mariann Laster from [place] is returning your call. She will be there until 3:30, call back at [phone]

## 2020-09-25 DIAGNOSIS — Z79899 Other long term (current) drug therapy: Secondary | ICD-10-CM | POA: Diagnosis not present

## 2020-09-25 DIAGNOSIS — Z1331 Encounter for screening for depression: Secondary | ICD-10-CM | POA: Diagnosis not present

## 2020-09-25 DIAGNOSIS — Z0001 Encounter for general adult medical examination with abnormal findings: Secondary | ICD-10-CM | POA: Diagnosis not present

## 2020-09-25 DIAGNOSIS — I509 Heart failure, unspecified: Secondary | ICD-10-CM | POA: Diagnosis not present

## 2020-09-25 DIAGNOSIS — I82501 Chronic embolism and thrombosis of unspecified deep veins of right lower extremity: Secondary | ICD-10-CM | POA: Diagnosis not present

## 2020-09-25 DIAGNOSIS — E1036 Type 1 diabetes mellitus with diabetic cataract: Secondary | ICD-10-CM | POA: Diagnosis not present

## 2020-09-25 DIAGNOSIS — Z1389 Encounter for screening for other disorder: Secondary | ICD-10-CM | POA: Diagnosis not present

## 2020-09-25 DIAGNOSIS — J449 Chronic obstructive pulmonary disease, unspecified: Secondary | ICD-10-CM | POA: Diagnosis not present

## 2020-09-25 DIAGNOSIS — Z933 Colostomy status: Secondary | ICD-10-CM | POA: Diagnosis not present

## 2020-09-26 DIAGNOSIS — N390 Urinary tract infection, site not specified: Secondary | ICD-10-CM | POA: Diagnosis not present

## 2020-09-26 DIAGNOSIS — Z433 Encounter for attention to colostomy: Secondary | ICD-10-CM | POA: Diagnosis not present

## 2020-09-26 DIAGNOSIS — E1122 Type 2 diabetes mellitus with diabetic chronic kidney disease: Secondary | ICD-10-CM | POA: Diagnosis not present

## 2020-09-26 DIAGNOSIS — K5732 Diverticulitis of large intestine without perforation or abscess without bleeding: Secondary | ICD-10-CM | POA: Diagnosis not present

## 2020-09-26 DIAGNOSIS — E1165 Type 2 diabetes mellitus with hyperglycemia: Secondary | ICD-10-CM | POA: Diagnosis not present

## 2020-09-26 DIAGNOSIS — I130 Hypertensive heart and chronic kidney disease with heart failure and stage 1 through stage 4 chronic kidney disease, or unspecified chronic kidney disease: Secondary | ICD-10-CM | POA: Diagnosis not present

## 2020-09-28 ENCOUNTER — Ambulatory Visit: Payer: Medicare Other | Admitting: Internal Medicine

## 2020-09-28 ENCOUNTER — Encounter: Payer: Self-pay | Admitting: Internal Medicine

## 2020-10-03 DIAGNOSIS — E1142 Type 2 diabetes mellitus with diabetic polyneuropathy: Secondary | ICD-10-CM | POA: Diagnosis not present

## 2020-10-03 DIAGNOSIS — L84 Corns and callosities: Secondary | ICD-10-CM | POA: Diagnosis not present

## 2020-10-03 DIAGNOSIS — L603 Nail dystrophy: Secondary | ICD-10-CM | POA: Diagnosis not present

## 2020-10-03 NOTE — Telephone Encounter (Signed)
Ann [name] is calling and states she has not received anything back from [place] and is requesting a call back [phone]

## 2020-10-03 NOTE — Telephone Encounter (Signed)
Left a message requesting a return call to the office. 

## 2020-10-04 ENCOUNTER — Encounter: Payer: Self-pay | Admitting: Cardiology

## 2020-10-04 ENCOUNTER — Other Ambulatory Visit: Payer: Self-pay

## 2020-10-04 ENCOUNTER — Ambulatory Visit (INDEPENDENT_AMBULATORY_CARE_PROVIDER_SITE_OTHER): Payer: Medicare Other | Admitting: Cardiology

## 2020-10-04 VITALS — BP 150/88 | HR 84 | Ht 63.0 in | Wt 246.4 lb

## 2020-10-04 DIAGNOSIS — I10 Essential (primary) hypertension: Secondary | ICD-10-CM | POA: Diagnosis not present

## 2020-10-04 DIAGNOSIS — I2510 Atherosclerotic heart disease of native coronary artery without angina pectoris: Secondary | ICD-10-CM | POA: Diagnosis not present

## 2020-10-04 DIAGNOSIS — R0602 Shortness of breath: Secondary | ICD-10-CM

## 2020-10-04 DIAGNOSIS — I4891 Unspecified atrial fibrillation: Secondary | ICD-10-CM | POA: Diagnosis not present

## 2020-10-04 DIAGNOSIS — E782 Mixed hyperlipidemia: Secondary | ICD-10-CM

## 2020-10-04 NOTE — Telephone Encounter (Signed)
Spoke with Mariann Laster, advised her Dr.Nida would need to see pt to perform a diabetic foot evaluation prior to filling out her paperwork. Pt has an appointment in June. Understanding voiced.
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
## 2020-10-04 NOTE — Patient Instructions (Signed)
Medication Instructions:  Your physician recommends that you continue on your current medications as directed. Please refer to the Current Medication list given to you today.  *If you need a refill on your cardiac medications before your next appointment, please call your pharmacy*   Lab Work: Lab work requested from primary care provider If you have labs (blood work) drawn today and your tests are completely normal, you will receive your results only by: Marland Kitchen MyChart Message (if you have MyChart) OR . A paper copy in the mail If you have any lab test that is abnormal or we need to change your treatment, we will call you to review the results.   Testing/Procedures: Your physician has requested that you have a lexiscan myoview. For further information please visit [email or web address]. Please follow instruction sheet, as given.     Follow-Up: At [facility], you and your health needs are our priority.  As part of our continuing mission to provide you with exceptional heart care, we have created designated Provider Care Teams.  These Care Teams include your primary Cardiologist (physician) and Advanced Practice Providers (APPs -  Physician Assistants and Nurse Practitioners) who all work together to provide you with the care you need, when you need it.  We recommend signing up for the patient portal called "MyChart".  Sign up information is provided on this After Visit Summary.  MyChart is used to connect with patients for Virtual Visits (Telemedicine).  Patients are able to view lab/test results, encounter notes, upcoming appointments, etc.  Non-urgent messages can be sent to your provider as well.   To learn more about what you can do with MyChart, go to [email or web address].    Your next appointment:   6 month(s)  The format for your next appointment:   In Person  Provider:   Carlyle Dolly, MD   Other Instructions

## 2020-10-05 ENCOUNTER — Encounter: Payer: Self-pay | Admitting: General Surgery

## 2020-10-05 ENCOUNTER — Ambulatory Visit (INDEPENDENT_AMBULATORY_CARE_PROVIDER_SITE_OTHER): Payer: Medicare Other | Admitting: General Surgery

## 2020-10-05 VITALS — BP 162/80 | HR 85 | Temp 98.1°F | Resp 18 | Ht 64.0 in | Wt 246.0 lb

## 2020-10-05 DIAGNOSIS — Z933 Colostomy status: Secondary | ICD-10-CM | POA: Diagnosis not present

## 2020-10-05 DIAGNOSIS — K9409 Other complications of colostomy: Secondary | ICD-10-CM

## 2020-10-05 NOTE — Patient Instructions (Signed)
Continue ostomy care. Do cardiac workup and see me after workup.

## 2020-10-05 NOTE — Progress Notes (Signed)
[facility] Note   HPI:  75 y.o. Female presents to clinic for follow-up evaluation of her ostomy. She has seen Dr. Harl Bowie and is going to get a stress test to see if she can undergo reversal surgery given her history. Colonoscopy completed.   Review of Systems:  Regular Bms All other review of systems: otherwise negative   Vital Signs:  BP (!) 162/80   Pulse 85   Temp 98.1 F (36.7 C) (Oral)   Resp 18   Ht 5\' 4"  (1.626 m)   Wt 246 lb (111.6 kg)   SpO2 92%   BMI 42.23 kg/m    Physical Exam:  Physical Exam Vitals reviewed.  Constitutional:      Appearance: She is obese.  Cardiovascular:     Rate and Rhythm: Normal rate.  Pulmonary:     Effort: Pulmonary effort is normal.  Abdominal:     General: There is no distension.     Palpations: Abdomen is soft.     Tenderness: There is no abdominal tenderness.     Comments: Ostomy pink and patent, digital dilation done  Neurological:     Mental Status: She is alert.     Assessment:  75 y.o. yo Female with end colostomy in place. The ostomy is open and patent and making sure. She is scheduled for stress testing before any elective procedures for reversal. Appreciate Dr. Nelly Laurence assistance.  Plan:  - Ostomy care  - Will see after stress testing  Future Appointments  Date Time Provider [place]  10/17/2020  9:30 AM AP [facility] AP-CREHP APCREHP  10/17/2020  9:30 AM AP-NM 2 AP-NM [place] [facility]  10/18/2020  9:30 AM AP-NM 2 AP-NM [place] [facility]  11/16/2020  1:30 PM Nida, Marella Chimes, MD REA-REA None  11/16/2020  3:15 PM Virl Cagey, MD RS-RS None     Curlene Labrum, MD [facility] [address] [place], [place] [address] [phone] (office)

## 2020-10-17 ENCOUNTER — Telehealth: Payer: Self-pay

## 2020-10-17 ENCOUNTER — Encounter (HOSPITAL_COMMUNITY): Payer: Medicare Other

## 2020-10-17 NOTE — Telephone Encounter (Signed)
1:09 PM: Palliative care SW outreached patient for monthly telephonic visit.  SW left HIPPA complaint VM. Awaiting return call. Will continue to offer palliative care support.

## 2020-10-18 ENCOUNTER — Encounter (HOSPITAL_COMMUNITY): Payer: Medicare Other

## 2020-10-26 ENCOUNTER — Encounter (HOSPITAL_COMMUNITY): Payer: Medicare Other

## 2020-10-26 DIAGNOSIS — E11649 Type 2 diabetes mellitus with hypoglycemia without coma: Secondary | ICD-10-CM | POA: Diagnosis not present

## 2020-10-26 DIAGNOSIS — I509 Heart failure, unspecified: Secondary | ICD-10-CM | POA: Diagnosis not present

## 2020-10-30 ENCOUNTER — Encounter (HOSPITAL_COMMUNITY)
Admission: RE | Admit: 2020-10-30 | Discharge: 2020-10-30 | Disposition: A | Discharge status: Home / Self Care | Payer: Medicare Other | Source: Ambulatory Visit | Attending: Dermatology | Admitting: Dermatology

## 2020-10-30 ENCOUNTER — Encounter (HOSPITAL_BASED_OUTPATIENT_CLINIC_OR_DEPARTMENT_OTHER)
Admission: RE | Admit: 2020-10-30 | Discharge: 2020-10-30 | Disposition: A | Discharge status: Home / Self Care | Payer: Medicare Other | Source: Ambulatory Visit | Attending: Cardiology | Admitting: Cardiology

## 2020-10-30 DIAGNOSIS — R0602 Shortness of breath: Secondary | ICD-10-CM | POA: Diagnosis not present

## 2020-10-30 MED ORDER — SODIUM CHLORIDE FLUSH 0.9 % IV SOLN
INTRAVENOUS | Status: AC
  Administered 2020-10-30: 10 mL via INTRAVENOUS
  Filled 2020-10-30: qty 10

## 2020-10-30 MED ORDER — REGADENOSON 0.4 MG/5ML IV SOLN
INTRAVENOUS | Status: AC
  Administered 2020-10-30: 0.4 mg via INTRAVENOUS
  Filled 2020-10-30: qty 5

## 2020-10-30 MED ORDER — TECHNETIUM TC 99M TETROFOSMIN IV KIT
30.0000 | PACK | Freq: Once | INTRAVENOUS | Status: AC | PRN
  Administered 2020-10-30: 30 via INTRAVENOUS

## 2020-10-31 ENCOUNTER — Encounter (HOSPITAL_COMMUNITY): Payer: Self-pay

## 2020-10-31 ENCOUNTER — Encounter (HOSPITAL_COMMUNITY)
Admission: RE | Admit: 2020-10-31 | Discharge: 2020-10-31 | Disposition: A | Discharge status: Home / Self Care | Payer: Medicare Other | Source: Ambulatory Visit | Attending: Cardiology | Admitting: Cardiology

## 2020-10-31 LAB — NM MYOCAR MULTI W/SPECT W/WALL MOTION / EF
LV dias vol: 94 mL (ref 46–106)
LV sys vol: 38 mL
Peak HR: 139 {beats}/min
RATE: 0.46
Rest HR: 83 {beats}/min
SDS: 2
SRS: 2
SSS: 4
TID: 1.04

## 2020-10-31 MED ORDER — TECHNETIUM TC 99M TETROFOSMIN IV KIT
30.0000 | PACK | Freq: Once | INTRAVENOUS | Status: AC | PRN
  Administered 2020-10-31: 32.9 via INTRAVENOUS

## 2020-11-08 ENCOUNTER — Telehealth: Payer: Self-pay

## 2020-11-08 NOTE — Telephone Encounter (Signed)
303 pm  Phone call made to schedule a home visit or complete a telephonic visit.  No answer.  Message has been left requesting a call back.  Response pending.  Palliative Care will continue to outreach patient if now call back is received.

## 2020-11-16 ENCOUNTER — Ambulatory Visit (INDEPENDENT_AMBULATORY_CARE_PROVIDER_SITE_OTHER): Payer: Medicare Other | Admitting: "Endocrinology

## 2020-11-16 ENCOUNTER — Ambulatory Visit: Payer: Medicare Other | Admitting: General Surgery

## 2020-11-16 ENCOUNTER — Encounter: Payer: Self-pay | Admitting: "Endocrinology

## 2020-11-16 ENCOUNTER — Other Ambulatory Visit: Payer: Self-pay

## 2020-11-16 VITALS — BP 140/64 | HR 64 | Ht 64.0 in | Wt 235.0 lb

## 2020-11-16 DIAGNOSIS — E2740 Unspecified adrenocortical insufficiency: Secondary | ICD-10-CM

## 2020-11-16 DIAGNOSIS — E782 Mixed hyperlipidemia: Secondary | ICD-10-CM

## 2020-11-16 DIAGNOSIS — E1165 Type 2 diabetes mellitus with hyperglycemia: Secondary | ICD-10-CM | POA: Diagnosis not present

## 2020-11-16 DIAGNOSIS — I2510 Atherosclerotic heart disease of native coronary artery without angina pectoris: Secondary | ICD-10-CM

## 2020-11-16 DIAGNOSIS — E039 Hypothyroidism, unspecified: Secondary | ICD-10-CM

## 2020-11-16 DIAGNOSIS — I10 Essential (primary) hypertension: Secondary | ICD-10-CM

## 2020-11-16 LAB — POCT GLYCOSYLATED HEMOGLOBIN (HGB A1C): HbA1c, POC (controlled diabetic range): 6.6 % (ref 0.0–7.0)

## 2020-11-16 MED ORDER — PREDNISONE 10 MG PO TABS: 10.0000 mg | ORAL_TABLET | Freq: Every day | ORAL | 1 refills | Status: DC

## 2020-11-16 MED ORDER — LEVEMIR FLEXTOUCH 100 UNIT/ML ~~LOC~~ SOPN
50.0000 [IU] | PEN_INJECTOR | Freq: Every day | SUBCUTANEOUS | 2 refills | Status: DC
Start: 2020-11-16 — End: 2020-11-16

## 2020-11-16 MED ORDER — LEVOTHYROXINE SODIUM 175 MCG PO TABS: 175.0000 ug | ORAL_TABLET | Freq: Every morning | ORAL | 1 refills | Status: DC

## 2020-11-16 MED ORDER — LEVEMIR FLEXTOUCH 100 UNIT/ML ~~LOC~~ SOPN: 50.0000 [IU] | PEN_INJECTOR | Freq: Every day | SUBCUTANEOUS | 1 refills | Status: DC

## 2020-11-16 NOTE — Patient Instructions (Signed)
                                     Advice for Weight Management  -For most of us the best way to lose weight is by diet management. Generally speaking, diet management means consuming less calories intentionally which over time brings about progressive weight loss.  This can be achieved more effectively by restricting carbohydrate consumption to the minimum possible.  So, it is critically important to know your numbers: how much calorie you are consuming and how much calorie you need. More importantly, our carbohydrates sources should be unprocessed or minimally processed complex starch food items.   Sometimes, it is important to balance nutrition by increasing protein intake (animal or plant source), fruits, and vegetables.  - Whole Food, Plant Predominant Nutrition is highly recommended: Eat Plenty of vegetables, Mushrooms, fruits, Legumes, Whole Grains, Nuts, seeds in lieu of processed meats, processed snacks/pastries red meat, poultry, eggs.  -Sticking to a routine mealtime to eat 3 meals a day and avoiding unnecessary snacks is shown to have a big role in weight control. Under normal circumstances, the only time we lose real weight is when we are hungry, so allow hunger to take place- hunger means no food between meal times, only water.  It is not advisable to starve.   -It is better to avoid simple carbohydrates including: Cakes, Sweet Desserts, Ice Cream, Soda (diet and regular), Sweet Tea, Candies, Chips, Cookies, Store Bought Juices, Alcohol in Excess of  1-2 drinks a day, Lemonade,  Artificial Sweeteners, Doughnuts, Coffee Creamers, "Sugar-free" Products, etc, etc.  This is not a complete list.....    -Consulting with certified diabetes educators is proven to provide you with the most accurate and current information on diet.  Also, you may be  interested in discussing diet options/exchanges , we can schedule a visit with Ann [name], RDN, CDE for  individualized nutrition education.  -Exercise: If you are able: 30 -60 minutes a day ,4 days a week, or 150 minutes a week.  The longer the better.  Combine stretch, strength, and aerobic activities.  If you were told in the past that you have high risk for cardiovascular diseases, you may seek evaluation by your heart doctor prior to initiating moderate to intense exercise programs.                                  Additional Care Considerations for Diabetes   -Diabetes  is a chronic disease.  The most important care consideration is regular follow-up with your diabetes care provider with the goal being avoiding or delaying its complications and to take advantage of advances in medications and technology.    - Whole Food, Plant Predominant Nutrition is highly recommended: Eat Plenty of vegetables, Mushrooms, fruits, Legumes, Whole Grains, Nuts, seeds in lieu of processed meats, processed snacks/pastries red meat, poultry, eggs.  -Type 2 diabetes is known to coexist with other important comorbidities such as high blood pressure and high cholesterol.  It is critical to control not only the diabetes but also the high blood pressure and high cholesterol to minimize and delay the risk of complications including coronary artery disease, stroke, amputations, blindness, etc.    - Studies showed that people with diabetes will benefit from a class of medications known as ACE inhibitors and statins.  Unless   there are specific reasons not to be on these medications, the standard of care is to consider getting one from these groups of medications at an optimal doses.  These medications are generally considered safe and proven to help protect the heart and the kidneys.    - People with diabetes are encouraged to initiate and maintain regular follow-up with eye doctors, foot doctors, dentists , and if necessary heart and kidney doctors.     - It is highly recommended that people with diabetes quit smoking or  stay away from smoking, and get yearly  flu vaccine and pneumonia vaccine at least every 5 years.  One other important lifestyle recommendation is to ensure adequate sleep - at least 6-7 hours of uninterrupted sleep at night.  -Exercise: If you are able: 30 -60 minutes a day, 4 days a week, or 150 minutes a week.  The longer the better.  Combine stretch, strength, and aerobic activities.  If you were told in the past that you have high risk for cardiovascular diseases, you may seek evaluation by your heart doctor prior to initiating moderate to intense exercise programs.         
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
## 2020-11-21 ENCOUNTER — Encounter: Payer: Self-pay | Admitting: Internal Medicine

## 2020-12-01 ENCOUNTER — Other Ambulatory Visit (HOSPITAL_COMMUNITY): Payer: Self-pay | Admitting: Gerontology

## 2020-12-01 ENCOUNTER — Other Ambulatory Visit: Payer: Self-pay

## 2020-12-01 ENCOUNTER — Ambulatory Visit (HOSPITAL_COMMUNITY)
Admission: RE | Admit: 2020-12-01 | Discharge: 2020-12-01 | Disposition: A | Discharge status: Home / Self Care | Payer: Medicare Other | Source: Ambulatory Visit | Attending: Gerontology | Admitting: Gerontology

## 2020-12-01 DIAGNOSIS — M25511 Pain in right shoulder: Secondary | ICD-10-CM | POA: Diagnosis present

## 2020-12-05 ENCOUNTER — Other Ambulatory Visit: Payer: Self-pay

## 2020-12-05 ENCOUNTER — Encounter: Payer: Self-pay | Admitting: General Surgery

## 2020-12-05 ENCOUNTER — Ambulatory Visit (INDEPENDENT_AMBULATORY_CARE_PROVIDER_SITE_OTHER): Payer: Medicare Other | Admitting: General Surgery

## 2020-12-05 VITALS — BP 108/78 | HR 79 | Temp 98.8°F | Resp 18 | Ht 64.0 in | Wt 233.0 lb

## 2020-12-05 DIAGNOSIS — Z933 Colostomy status: Secondary | ICD-10-CM

## 2020-12-05 DIAGNOSIS — K9409 Other complications of colostomy: Secondary | ICD-10-CM | POA: Diagnosis not present

## 2020-12-05 MED ORDER — NEOMYCIN SULFATE 500 MG PO TABS: 1000.0000 mg | ORAL_TABLET | ORAL | 0 refills | Status: DC

## 2020-12-05 MED ORDER — DULCOLAX 5 MG PO TBEC: 20.0000 mg | DELAYED_RELEASE_TABLET | Freq: Once | ORAL | 0 refills | Status: AC

## 2020-12-05 MED ORDER — METRONIDAZOLE 500 MG PO TABS: 1000.0000 mg | ORAL_TABLET | ORAL | 0 refills | Status: DC
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
## 2020-12-05 NOTE — Patient Instructions (Addendum)
Colon Preparation:   Buy from the Store: Miralax bottle [phone]).  Gatorade 64 oz (not red). Dulcolax tablets.   The Day Prior to Surgery: Take 4 ducolax tablets at 7am with water. Do an enema through your rectum at 8AM. Drink plenty of clear liquids all day to avoid dehydration, no solid food.    Mix the bottle of Miralax and 64 oz of Gatorade and drink this mixture starting at 10am.  Drink it gradually over the next few hours, 8 ounces every 15-30 minutes until it is gone. Finish this by 2pm.  Repeat an enema at 2pm.  Take 2 neomycin 500mg  tablets and 2 metronidazole 500mg  tablets at 2 pm. Take 2 neomycin 500mg  tablets and 2 metronidazole 500mg  tablets at 3pm. Take 2 neomycin 500mg  tablets and 2 metronidazole 500mg  tablets at 10pm.    Do not eat or drink anything after midnight the night before your surgery.  Do not eat or drink anything that morning, and take medications as instructed by the hospital staff on your preoperative visit.    Colostomy Reversal Surgery A colostomy reversal is a surgical procedure that is done to reverse a colostomy. In this reversal procedure, the large intestine is disconnected from the opening in the abdomen (stoma). Then, the changes that were made to the intestine during the colostomy will be reversed to restore the flow of stool through the entire intestine. Depending on the type of colostomy being reversed, this may involve one of the following: Reconnecting the two ends of the intestine that were separated during colostomy surgery. Closing the opening that was made in the side of the intestine to allow stool to be redirected through the stoma. After this surgery, a stoma and colostomy bag are no longer needed. Stool (feces) can leave your body through the rectum, as it did before you had a colostomy. Tell a health care provider about: Any allergies you have. All medicines you are taking, including vitamins, herbs, eye drops, creams, and over-the-counter  medicines. Any problems you or family members have had with anesthetic medicines. Any blood disorders you have. Any surgeries you have had. Any medical conditions you have. Whether you are pregnant or may be pregnant. What are the risks? Generally, this is a safe procedure. However, problems may occur, including: Infection. Bleeding. Allergic reactions to medicines. Damage to other structures or organs. A temporary condition in which the intestines stop moving and working correctly (ileus). This usually goes away in 3-7 days. A collection of pus (abscess) in the abdomen or pelvis. Intestinal blockage. Leaking at the area of the intestine where it was reconnected (anastomotic leak) or where the opening of the stoma was closed. Narrowing of the intestine (stricture) at the place where it was reconnected. Urinary and sexual dysfunction. What happens before the procedure? Medicines Ask your health care provider about: Changing or stopping your regular medicines. This is especially important if you are taking diabetes medicines or blood thinners. Taking medicines such as aspirin and ibuprofen. These medicines can thin your blood. Do not take these medicines unless your health care provider tells you to take them. Taking over-the-counter medicines, vitamins, herbs, and supplements. General instructions You may have an exam or testing. Plan to have someone take you home after the procedure. Plan to have a responsible adult care for you for at least 24 hours after you leave the hospital or clinic. This is important. Do not use any products that contain nicotine or tobacco, such as cigarettes, e-cigarettes, and chewing tobacco.  These can delay incision healing after surgery. If you need help quitting, ask your health care provider. Ask your health care provider what steps will be taken to help prevent infection. These may include: Removing hair at the surgery site. Washing skin with a  germ-killing soap. Antibiotic medicine. What happens during the procedure?  An IV will be inserted into one of your veins. You may be given: A medicine to help you relax (sedative). A medicine to make you fall asleep (general anesthetic). An incision will be made in your abdomen at the site of the stoma. The large intestine will be disconnected from the abdomen at the site of the stoma. The next steps will vary depending on the type of colostomy reversal surgery you are having. There are two main types: End colostomy reversal. The surgeon will use stitches (sutures) or staples to reconnect the two ends of the intestine that were separated during the end colostomy. Loop colostomy reversal. The surgeon will use sutures or staples to close the opening in the intestine that had been allowing stool to be redirected through the stoma. The intestine will then be put back into its normal position inside the abdomen. The incision will be closed with sutures, skin glue, or adhesive strips. It may be covered with bandages (dressings). The procedure may vary among health care providers and hospitals. What happens after the procedure? Your blood pressure, heart rate, breathing rate, and blood oxygen level will be monitored until you leave the hospital or clinic. You will be given pain medicine as needed. You will slowly increase your diet and movement as told by your health care provider. Summary A colostomy reversal is a surgical procedure that is done to reverse a colostomy. After this surgery, stool (feces) can leave your body through the rectum, as it did before you had a colostomy. Before the procedure, follow instructions from your health care provider about taking medicines and about eating and drinking. During the procedure, your colostomy will be reversed, and the incision will be closed with sutures, skin glue, or adhesive strips. It may be covered with bandages (dressings). After the procedure,  you will slowly increase your diet and movement as told by your health care provider. This information is not intended to replace advice given to you by your health care provider. Make sure you discuss any questions you have with your healthcare provider. Document Revised: 10/29/2017 Document Reviewed: 10/29/2017 Elsevier Patient Education  [place].
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
## 2020-12-14 NOTE — Patient Instructions (Signed)
    Ann [name]  12/14/2020     @PREFPERIOPPHARMACY @   Your procedure is scheduled on  12/18/2020.   Report to [facility] at     [email or web address].   Call this number if you have problems the morning of surgery:  [phone]      Remember:   The Day Prior to Surgery: Take 4 ducolax tablets at 7am with water. Do an enema through your rectum at 8AM. Drink plenty of clear liquids all day to avoid dehydration, no solid food.   Mix the bottle of Miralax and 64 oz of Gatorade and drink this mixture starting at 10am.  Drink it gradually over the next few hours, 8 ounces every 15-30 minutes until it is gone. Finish this by 2pm. Repeat an enema at 2pm. Take 2 neomycin 500mg  tablets and 2 metronidazole 500mg  tablets at 2 pm. Take 2 neomycin 500mg  tablets and 2 metronidazole 500mg  tablets at 3pm. Take 2 neomycin 500mg  tablets and 2 metronidazole 500mg  tablets at 10pm.  When you take your 10pm meds, drink 2 bottles of the carb drinks.           You may drink clear liquids until 0600 . At 0600 drink your 1 carb drink and nothing else to drink after this.      Clear liquids allowed are:                    Water, Juice (non-citric and without pulp - diabetics please choose diet or no sugar options), Carbonated beverages - (diabetics please choose diet or no sugar options), Clear Tea, Black Coffee only (no creamer, milk or cream including half and half), Plain Jell-O only (diabetics please choose diet or no sugar options), Gatorade (diabetics please choose diet or no sugar options), and Plain Popsicles only   Your last dose of eliquis should be 12/14/2020.  Take 25 units of your levemir the night before your procedure.  DO NOT take any medications for diabetes the morning of your procedure.  Use your nebulizer and your inhaler before you come and bring your rescue inhaler with you.     Take these medicines the morning of surgery with A SIP OF WATER       diltiazem, nexium, gabapentin,  levothyroxine, myrebetriq, oxycodone, prednisone.       Do not wear jewelry, make-up or nail polish.  Do not wear lotions, powders, or perfumes, or deodorant.  Do not shave 48 hours prior to surgery.  Men may shave face and neck.  Do not bring valuables to the hospital.  [facility] is not responsible for any belongings or valuables.  Contacts, dentures or bridgework may not be worn into surgery.  Leave your suitcase in the car.  After surgery it may be brought to your room.  For patients admitted to the hospital, discharge time will be determined by your treatment team.  Patients discharged the day of surgery will not be allowed to drive home.    Special instructions:     DO NOT smoke tobacco or vape for 24 hours before your procedure.  Please read over the following fact sheets that you were given. Pain Booklet, Coughing and Deep Breathing, Blood Transfusion Information, Surgical Site Infection Prevention, Anesthesia Post-op Instructions, and Care and Recovery After Surgery

## 2020-12-15 ENCOUNTER — Encounter (HOSPITAL_COMMUNITY)
Admission: RE | Admit: 2020-12-15 | Discharge: 2020-12-15 | Disposition: A | Discharge status: Home / Self Care | Payer: Medicare Other | Source: Ambulatory Visit | Attending: General Surgery | Admitting: General Surgery

## 2020-12-15 ENCOUNTER — Other Ambulatory Visit: Payer: Self-pay

## 2020-12-15 ENCOUNTER — Encounter (HOSPITAL_COMMUNITY): Payer: Self-pay

## 2020-12-15 DIAGNOSIS — Z01812 Encounter for preprocedural laboratory examination: Secondary | ICD-10-CM | POA: Insufficient documentation

## 2020-12-15 DIAGNOSIS — E1165 Type 2 diabetes mellitus with hyperglycemia: Secondary | ICD-10-CM

## 2020-12-15 DIAGNOSIS — Z794 Long term (current) use of insulin: Secondary | ICD-10-CM

## 2020-12-15 DIAGNOSIS — Z20822 Contact with and (suspected) exposure to covid-19: Secondary | ICD-10-CM | POA: Insufficient documentation

## 2020-12-15 HISTORY — DX: Dependence on supplemental oxygen: Z99.81

## 2020-12-15 LAB — COMPREHENSIVE METABOLIC PANEL
ALT: 16 U/L (ref 0–44)
AST: 24 U/L (ref 15–41)
Albumin: 4 g/dL (ref 3.5–5.0)
Alkaline Phosphatase: 117 U/L (ref 38–126)
Anion gap: 9 (ref 5–15)
BUN: 16 mg/dL (ref 8–23)
CO2: 28 mmol/L (ref 22–32)
Calcium: 9.5 mg/dL (ref 8.9–10.3)
Chloride: 99 mmol/L (ref 98–111)
Creatinine, Ser: 0.76 mg/dL (ref 0.44–1.00)
GFR, Estimated: >60 mL/min (ref >60)
Glucose, Bld: 157 mg/dL — ABNORMAL HIGH (ref 70–99)
Potassium: 4.4 mmol/L (ref 3.5–5.1)
Sodium: 136 mmol/L (ref 135–145)
Total Bilirubin: 0.6 mg/dL (ref 0.3–1.2)
Total Protein: 8.1 g/dL (ref 6.5–8.1)

## 2020-12-15 LAB — CBC WITH DIFFERENTIAL/PLATELET
Abs Immature Granulocytes: 0.03 10*3/uL (ref 0.00–0.07)
Basophils Absolute: 0.1 10*3/uL (ref 0.0–0.1)
Basophils Relative: 1 %
Eosinophils Absolute: 0.3 10*3/uL (ref 0.0–0.5)
Eosinophils Relative: 3 %
HCT: 38.6 % (ref 36.0–46.0)
Hemoglobin: 11.3 g/dL — ABNORMAL LOW (ref 12.0–15.0)
Immature Granulocytes: 0 %
Lymphocytes Relative: 19 %
Lymphs Abs: 1.7 10*3/uL (ref 0.7–4.0)
MCH: 23.9 pg — ABNORMAL LOW (ref 26.0–34.0)
MCHC: 29.3 g/dL — ABNORMAL LOW (ref 30.0–36.0)
MCV: 81.8 fL (ref 80.0–100.0)
Monocytes Absolute: 0.6 10*3/uL (ref 0.1–1.0)
Monocytes Relative: 7 %
Neutro Abs: 6.1 10*3/uL (ref 1.7–7.7)
Neutrophils Relative %: 70 %
Platelets: 306 10*3/uL (ref 150–400)
RBC: 4.72 MIL/uL (ref 3.87–5.11)
RDW: 17.7 % — ABNORMAL HIGH (ref 11.5–15.5)
WBC: 8.7 10*3/uL (ref 4.0–10.5)
nRBC: 0 % (ref 0.0–0.2)

## 2020-12-15 LAB — PREPARE RBC (CROSSMATCH)

## 2020-12-16 LAB — HEMOGLOBIN A1C
Hgb A1c MFr Bld: 6.9 % — ABNORMAL HIGH (ref 4.8–5.6)
Mean Plasma Glucose: 151 mg/dL

## 2020-12-16 LAB — SARS CORONAVIRUS 2 (TAT 6-24 HRS): SARS Coronavirus 2: NEGATIVE

## 2020-12-18 ENCOUNTER — Encounter (HOSPITAL_COMMUNITY)
Admission: RE | Disposition: A | Discharge status: Home / Self Care | Payer: Self-pay | Source: Home / Self Care | Attending: General Surgery

## 2020-12-18 ENCOUNTER — Other Ambulatory Visit: Payer: Self-pay

## 2020-12-18 ENCOUNTER — Encounter (HOSPITAL_COMMUNITY): Payer: Self-pay | Admitting: General Surgery

## 2020-12-18 ENCOUNTER — Inpatient Hospital Stay (HOSPITAL_COMMUNITY)
Admission: RE | Admit: 2020-12-18 | Discharge: 2020-12-23 | DRG: 330 | Disposition: A | Discharge status: Home Health | Payer: Medicare Other | Attending: General Surgery | Admitting: General Surgery

## 2020-12-18 ENCOUNTER — Inpatient Hospital Stay (HOSPITAL_COMMUNITY): Payer: Medicare Other | Admitting: Anesthesiology

## 2020-12-18 DIAGNOSIS — K9409 Other complications of colostomy: Secondary | ICD-10-CM | POA: Diagnosis present

## 2020-12-18 DIAGNOSIS — E1122 Type 2 diabetes mellitus with diabetic chronic kidney disease: Secondary | ICD-10-CM | POA: Diagnosis present

## 2020-12-18 DIAGNOSIS — Z801 Family history of malignant neoplasm of trachea, bronchus and lung: Secondary | ICD-10-CM

## 2020-12-18 DIAGNOSIS — R5381 Other malaise: Secondary | ICD-10-CM | POA: Diagnosis present

## 2020-12-18 DIAGNOSIS — I4891 Unspecified atrial fibrillation: Secondary | ICD-10-CM | POA: Diagnosis not present

## 2020-12-18 DIAGNOSIS — Z86718 Personal history of other venous thrombosis and embolism: Secondary | ICD-10-CM | POA: Diagnosis not present

## 2020-12-18 DIAGNOSIS — Z7952 Long term (current) use of systemic steroids: Secondary | ICD-10-CM | POA: Diagnosis not present

## 2020-12-18 DIAGNOSIS — E8809 Other disorders of plasma-protein metabolism, not elsewhere classified: Secondary | ICD-10-CM | POA: Diagnosis present

## 2020-12-18 DIAGNOSIS — Z90710 Acquired absence of both cervix and uterus: Secondary | ICD-10-CM

## 2020-12-18 DIAGNOSIS — E1142 Type 2 diabetes mellitus with diabetic polyneuropathy: Secondary | ICD-10-CM | POA: Diagnosis present

## 2020-12-18 DIAGNOSIS — Z7901 Long term (current) use of anticoagulants: Secondary | ICD-10-CM

## 2020-12-18 DIAGNOSIS — I130 Hypertensive heart and chronic kidney disease with heart failure and stage 1 through stage 4 chronic kidney disease, or unspecified chronic kidney disease: Secondary | ICD-10-CM | POA: Diagnosis present

## 2020-12-18 DIAGNOSIS — E782 Mixed hyperlipidemia: Secondary | ICD-10-CM | POA: Diagnosis present

## 2020-12-18 DIAGNOSIS — I2510 Atherosclerotic heart disease of native coronary artery without angina pectoris: Secondary | ICD-10-CM | POA: Diagnosis present

## 2020-12-18 DIAGNOSIS — J449 Chronic obstructive pulmonary disease, unspecified: Secondary | ICD-10-CM | POA: Diagnosis present

## 2020-12-18 DIAGNOSIS — I5032 Chronic diastolic (congestive) heart failure: Secondary | ICD-10-CM | POA: Diagnosis present

## 2020-12-18 DIAGNOSIS — I25118 Atherosclerotic heart disease of native coronary artery with other forms of angina pectoris: Secondary | ICD-10-CM | POA: Diagnosis not present

## 2020-12-18 DIAGNOSIS — E2740 Unspecified adrenocortical insufficiency: Secondary | ICD-10-CM | POA: Diagnosis present

## 2020-12-18 DIAGNOSIS — Z8249 Family history of ischemic heart disease and other diseases of the circulatory system: Secondary | ICD-10-CM

## 2020-12-18 DIAGNOSIS — I10 Essential (primary) hypertension: Secondary | ICD-10-CM | POA: Diagnosis not present

## 2020-12-18 DIAGNOSIS — Z9981 Dependence on supplemental oxygen: Secondary | ICD-10-CM

## 2020-12-18 DIAGNOSIS — Z933 Colostomy status: Secondary | ICD-10-CM

## 2020-12-18 DIAGNOSIS — Z955 Presence of coronary angioplasty implant and graft: Secondary | ICD-10-CM

## 2020-12-18 DIAGNOSIS — I482 Chronic atrial fibrillation, unspecified: Secondary | ICD-10-CM | POA: Diagnosis present

## 2020-12-18 DIAGNOSIS — E039 Hypothyroidism, unspecified: Secondary | ICD-10-CM | POA: Diagnosis present

## 2020-12-18 DIAGNOSIS — Z87891 Personal history of nicotine dependence: Secondary | ICD-10-CM | POA: Diagnosis not present

## 2020-12-18 DIAGNOSIS — I480 Paroxysmal atrial fibrillation: Secondary | ICD-10-CM | POA: Diagnosis present

## 2020-12-18 DIAGNOSIS — E1165 Type 2 diabetes mellitus with hyperglycemia: Secondary | ICD-10-CM | POA: Diagnosis present

## 2020-12-18 DIAGNOSIS — J9610 Chronic respiratory failure, unspecified whether with hypoxia or hypercapnia: Secondary | ICD-10-CM | POA: Diagnosis present

## 2020-12-18 DIAGNOSIS — Z20822 Contact with and (suspected) exposure to covid-19: Secondary | ICD-10-CM | POA: Diagnosis present

## 2020-12-18 DIAGNOSIS — Z794 Long term (current) use of insulin: Secondary | ICD-10-CM

## 2020-12-18 DIAGNOSIS — E538 Deficiency of other specified B group vitamins: Secondary | ICD-10-CM | POA: Diagnosis present

## 2020-12-18 DIAGNOSIS — Z433 Encounter for attention to colostomy: Principal | ICD-10-CM

## 2020-12-18 DIAGNOSIS — E871 Hypo-osmolality and hyponatremia: Secondary | ICD-10-CM | POA: Diagnosis not present

## 2020-12-18 DIAGNOSIS — Z9889 Other specified postprocedural states: Secondary | ICD-10-CM | POA: Diagnosis present

## 2020-12-18 DIAGNOSIS — Z800 Family history of malignant neoplasm of digestive organs: Secondary | ICD-10-CM

## 2020-12-18 DIAGNOSIS — R7309 Other abnormal glucose: Secondary | ICD-10-CM | POA: Diagnosis present

## 2020-12-18 DIAGNOSIS — E46 Unspecified protein-calorie malnutrition: Secondary | ICD-10-CM | POA: Diagnosis present

## 2020-12-18 DIAGNOSIS — N183 Chronic kidney disease, stage 3 unspecified: Secondary | ICD-10-CM | POA: Diagnosis present

## 2020-12-18 DIAGNOSIS — N182 Chronic kidney disease, stage 2 (mild): Secondary | ICD-10-CM | POA: Diagnosis present

## 2020-12-18 HISTORY — PX: COLOSTOMY REVERSAL: SHX5782

## 2020-12-18 LAB — CBC
HCT: 40 % (ref 36.0–46.0)
Hemoglobin: 11.7 g/dL — ABNORMAL LOW (ref 12.0–15.0)
MCH: 24 pg — ABNORMAL LOW (ref 26.0–34.0)
MCHC: 29.3 g/dL — ABNORMAL LOW (ref 30.0–36.0)
MCV: 82 fL (ref 80.0–100.0)
Platelets: 261 10*3/uL (ref 150–400)
RBC: 4.88 MIL/uL (ref 3.87–5.11)
RDW: 17.9 % — ABNORMAL HIGH (ref 11.5–15.5)
WBC: 22.5 10*3/uL — ABNORMAL HIGH (ref 4.0–10.5)
nRBC: 0 % (ref 0.0–0.2)

## 2020-12-18 LAB — BASIC METABOLIC PANEL
Anion gap: 8 (ref 5–15)
BUN: 16 mg/dL (ref 8–23)
CO2: 25 mmol/L (ref 22–32)
Calcium: 8.7 mg/dL — ABNORMAL LOW (ref 8.9–10.3)
Chloride: 100 mmol/L (ref 98–111)
Creatinine, Ser: 0.79 mg/dL (ref 0.44–1.00)
GFR, Estimated: >60 mL/min (ref >60)
Glucose, Bld: 236 mg/dL — ABNORMAL HIGH (ref 70–99)
Potassium: 4.4 mmol/L (ref 3.5–5.1)
Sodium: 133 mmol/L — ABNORMAL LOW (ref 135–145)

## 2020-12-18 LAB — GLUCOSE, CAPILLARY
Glucose-Capillary: 196 mg/dL — ABNORMAL HIGH (ref 70–99)
Glucose-Capillary: 182 mg/dL — ABNORMAL HIGH (ref 70–99)
Glucose-Capillary: 221 mg/dL — ABNORMAL HIGH (ref 70–99)
Glucose-Capillary: 200 mg/dL — ABNORMAL HIGH (ref 70–99)

## 2020-12-18 LAB — MRSA NEXT GEN BY PCR, NASAL: MRSA by PCR Next Gen: NOT DETECTED

## 2020-12-18 SURGERY — COLOSTOMY REVERSAL
Anesthesia: General | Site: Abdomen

## 2020-12-18 MED ORDER — CHLORHEXIDINE GLUCONATE CLOTH 2 % EX PADS
6.0000 | MEDICATED_PAD | Freq: Every day | CUTANEOUS | Status: DC
  Administered 2020-12-19 – 2020-12-23 (×5): 6 via TOPICAL

## 2020-12-18 MED ORDER — ONDANSETRON HCL 4 MG/2ML IJ SOLN
INTRAMUSCULAR | Status: AC
  Filled 2020-12-18: qty 2

## 2020-12-18 MED ORDER — ALBUTEROL SULFATE HFA 108 (90 BASE) MCG/ACT IN AERS: 2.0000 | INHALATION_SPRAY | RESPIRATORY_TRACT | Status: DC | PRN

## 2020-12-18 MED ORDER — LACTATED RINGERS IV BOLUS
500.0000 mL | Freq: Once | INTRAVENOUS | Status: AC
  Administered 2020-12-18: 500 mL via INTRAVENOUS

## 2020-12-18 MED ORDER — PREDNISONE 20 MG PO TABS
20.0000 mg | ORAL_TABLET | Freq: Once | ORAL | Status: AC
  Administered 2020-12-20: 20 mg via ORAL
  Filled 2020-12-18: qty 1

## 2020-12-18 MED ORDER — ONDANSETRON HCL 4 MG/2ML IJ SOLN: 4.0000 mg | Freq: Once | INTRAMUSCULAR | Status: DC | PRN

## 2020-12-18 MED ORDER — DILTIAZEM HCL ER COATED BEADS 240 MG PO CP24
240.0000 mg | ORAL_CAPSULE | Freq: Every day | ORAL | Status: DC
  Administered 2020-12-19 – 2020-12-23 (×5): 240 mg via ORAL
  Filled 2020-12-18 (×2): qty 1
  Filled 2020-12-18: qty 2
  Filled 2020-12-18 (×2): qty 1

## 2020-12-18 MED ORDER — METOPROLOL TARTRATE 5 MG/5ML IV SOLN
5.0000 mg | Freq: Four times a day (QID) | INTRAVENOUS | Status: DC | PRN
Start: 2020-12-18 — End: 2020-12-23

## 2020-12-18 MED ORDER — MIRABEGRON ER 25 MG PO TB24
50.0000 mg | ORAL_TABLET | Freq: Every day | ORAL | Status: DC
  Administered 2020-12-19 – 2020-12-23 (×5): 50 mg via ORAL
  Filled 2020-12-18 (×5): qty 2

## 2020-12-18 MED ORDER — TRAZODONE HCL 50 MG PO TABS
50.0000 mg | ORAL_TABLET | Freq: Every evening | ORAL | Status: DC | PRN
  Filled 2020-12-18: qty 1

## 2020-12-18 MED ORDER — PHENYLEPHRINE HCL-NACL 10-0.9 MG/250ML-% IV SOLN
INTRAVENOUS | Status: DC | PRN
  Administered 2020-12-18: 25 ug/min via INTRAVENOUS

## 2020-12-18 MED ORDER — EPHEDRINE SULFATE 50 MG/ML IJ SOLN
INTRAMUSCULAR | Status: DC | PRN
  Administered 2020-12-18: 5 mg via INTRAVENOUS
  Administered 2020-12-18: 10 mg via INTRAVENOUS

## 2020-12-18 MED ORDER — HEPARIN SODIUM (PORCINE) 5000 UNIT/ML IJ SOLN
5000.0000 [IU] | Freq: Once | INTRAMUSCULAR | Status: AC
  Administered 2020-12-18: 5000 [IU] via SUBCUTANEOUS

## 2020-12-18 MED ORDER — ONDANSETRON HCL 4 MG PO TABS
4.0000 mg | ORAL_TABLET | Freq: Four times a day (QID) | ORAL | Status: DC | PRN
  Administered 2020-12-19: 4 mg via ORAL
  Filled 2020-12-18: qty 1

## 2020-12-18 MED ORDER — PROPOFOL 10 MG/ML IV BOLUS
INTRAVENOUS | Status: DC | PRN
  Administered 2020-12-18: 80 mg via INTRAVENOUS

## 2020-12-18 MED ORDER — FLEET ENEMA 7-19 GM/118ML RE ENEM
1.0000 | ENEMA | Freq: Once | RECTAL | Status: DC
  Filled 2020-12-18: qty 1

## 2020-12-18 MED ORDER — ROCURONIUM BROMIDE 100 MG/10ML IV SOLN
INTRAVENOUS | Status: DC | PRN
  Administered 2020-12-18: 30 mg via INTRAVENOUS
  Administered 2020-12-18: 50 mg via INTRAVENOUS

## 2020-12-18 MED ORDER — SIMETHICONE 80 MG PO CHEW: 40.0000 mg | CHEWABLE_TABLET | Freq: Four times a day (QID) | ORAL | Status: DC | PRN

## 2020-12-18 MED ORDER — FENTANYL CITRATE (PF) 250 MCG/5ML IJ SOLN
INTRAMUSCULAR | Status: DC | PRN
  Administered 2020-12-18 (×4): 50 ug via INTRAVENOUS
  Administered 2020-12-18: 25 ug via INTRAVENOUS
  Administered 2020-12-18 (×2): 50 ug via INTRAVENOUS

## 2020-12-18 MED ORDER — INSULIN DETEMIR 100 UNIT/ML ~~LOC~~ SOLN
25.0000 [IU] | Freq: Every day | SUBCUTANEOUS | Status: DC
  Administered 2020-12-18 – 2020-12-21 (×4): 25 [IU] via SUBCUTANEOUS
  Filled 2020-12-18 (×7): qty 0.25

## 2020-12-18 MED ORDER — PHENYLEPHRINE 40 MCG/ML (10ML) SYRINGE FOR IV PUSH (FOR BLOOD PRESSURE SUPPORT)
PREFILLED_SYRINGE | INTRAVENOUS | Status: AC
  Filled 2020-12-18: qty 20

## 2020-12-18 MED ORDER — UMECLIDINIUM BROMIDE 62.5 MCG/INH IN AEPB
2.0000 | INHALATION_SPRAY | Freq: Every day | RESPIRATORY_TRACT | Status: DC
  Administered 2020-12-19 – 2020-12-23 (×3): 2 via RESPIRATORY_TRACT
  Filled 2020-12-18 (×2): qty 7

## 2020-12-18 MED ORDER — BUPIVACAINE LIPOSOME 1.3 % IJ SUSP
INTRAMUSCULAR | Status: AC
  Filled 2020-12-18: qty 20

## 2020-12-18 MED ORDER — ONDANSETRON HCL 4 MG/2ML IJ SOLN: 4.0000 mg | Freq: Three times a day (TID) | INTRAMUSCULAR | Status: DC | PRN

## 2020-12-18 MED ORDER — SODIUM CHLORIDE 0.9 % IV SOLN: INTRAVENOUS | Status: DC

## 2020-12-18 MED ORDER — HYDROCORTISONE NA SUCCINATE PF 100 MG IJ SOLR
100.0000 mg | Freq: Once | INTRAMUSCULAR | Status: AC
  Administered 2020-12-18: 100 mg via INTRAVENOUS
  Filled 2020-12-18: qty 2

## 2020-12-18 MED ORDER — FUROSEMIDE 40 MG PO TABS: 40.0000 mg | ORAL_TABLET | Freq: Two times a day (BID) | ORAL | Status: DC

## 2020-12-18 MED ORDER — NITROGLYCERIN 0.4 MG SL SUBL: 0.4000 mg | SUBLINGUAL_TABLET | SUBLINGUAL | Status: DC | PRN

## 2020-12-18 MED ORDER — DEXAMETHASONE SODIUM PHOSPHATE 4 MG/ML IJ SOLN
INTRAMUSCULAR | Status: AC
  Filled 2020-12-18: qty 1

## 2020-12-18 MED ORDER — PREDNISONE 20 MG PO TABS
40.0000 mg | ORAL_TABLET | Freq: Every day | ORAL | Status: DC
  Filled 2020-12-18 (×2): qty 2

## 2020-12-18 MED ORDER — CHLORHEXIDINE GLUCONATE CLOTH 2 % EX PADS: 6.0000 | MEDICATED_PAD | Freq: Once | CUTANEOUS | Status: DC

## 2020-12-18 MED ORDER — SODIUM CHLORIDE 0.9 % IR SOLN
Status: DC | PRN
  Administered 2020-12-18 (×4): 1000 mL

## 2020-12-18 MED ORDER — PREDNISONE 10 MG PO TABS
10.0000 mg | ORAL_TABLET | Freq: Every day | ORAL | Status: DC
  Administered 2020-12-21 – 2020-12-23 (×3): 10 mg via ORAL
  Filled 2020-12-18 (×3): qty 1

## 2020-12-18 MED ORDER — MORPHINE SULFATE (PF) 2 MG/ML IV SOLN: 2.0000 mg | INTRAVENOUS | Status: DC | PRN

## 2020-12-18 MED ORDER — LIDOCAINE HCL (CARDIAC) PF 100 MG/5ML IV SOSY
PREFILLED_SYRINGE | INTRAVENOUS | Status: DC | PRN
  Administered 2020-12-18: 100 mg via INTRATRACHEAL

## 2020-12-18 MED ORDER — CHLORHEXIDINE GLUCONATE 0.12 % MT SOLN
15.0000 mL | Freq: Once | OROMUCOSAL | Status: AC
  Administered 2020-12-18: 15 mL via OROMUCOSAL

## 2020-12-18 MED ORDER — PROPOFOL 10 MG/ML IV BOLUS
INTRAVENOUS | Status: AC
  Filled 2020-12-18: qty 20

## 2020-12-18 MED ORDER — ALVIMOPAN 12 MG PO CAPS
ORAL_CAPSULE | ORAL | Status: AC
  Filled 2020-12-18: qty 1

## 2020-12-18 MED ORDER — SODIUM CHLORIDE 0.9 % IV SOLN
2.0000 g | Freq: Two times a day (BID) | INTRAVENOUS | Status: AC
  Administered 2020-12-18 – 2020-12-19 (×2): 2 g via INTRAVENOUS
  Filled 2020-12-18 (×2): qty 2

## 2020-12-18 MED ORDER — ACETAMINOPHEN 500 MG PO TABS
1000.0000 mg | ORAL_TABLET | Freq: Four times a day (QID) | ORAL | Status: DC
  Administered 2020-12-18 – 2020-12-23 (×19): 1000 mg via ORAL
  Filled 2020-12-18 (×19): qty 2

## 2020-12-18 MED ORDER — FENTANYL CITRATE (PF) 100 MCG/2ML IJ SOLN
INTRAMUSCULAR | Status: AC
  Filled 2020-12-18: qty 2

## 2020-12-18 MED ORDER — SACCHAROMYCES BOULARDII 250 MG PO CAPS
250.0000 mg | ORAL_CAPSULE | Freq: Two times a day (BID) | ORAL | Status: DC
  Administered 2020-12-18 – 2020-12-23 (×10): 250 mg via ORAL
  Filled 2020-12-18 (×10): qty 1

## 2020-12-18 MED ORDER — PROCHLORPERAZINE EDISYLATE 10 MG/2ML IJ SOLN: 5.0000 mg | Freq: Four times a day (QID) | INTRAMUSCULAR | Status: DC | PRN

## 2020-12-18 MED ORDER — SERTRALINE HCL 50 MG PO TABS
50.0000 mg | ORAL_TABLET | Freq: Every day | ORAL | Status: DC
  Administered 2020-12-18 – 2020-12-22 (×5): 50 mg via ORAL
  Filled 2020-12-18 (×5): qty 1

## 2020-12-18 MED ORDER — PREDNISONE 10 MG PO TABS: 10.0000 mg | ORAL_TABLET | Freq: Every day | ORAL | Status: DC

## 2020-12-18 MED ORDER — PHENYLEPHRINE HCL (PRESSORS) 10 MG/ML IV SOLN
INTRAVENOUS | Status: DC | PRN
  Administered 2020-12-18: 80 ug via INTRAVENOUS
  Administered 2020-12-18: 120 ug via INTRAVENOUS
  Administered 2020-12-18: 80 ug via INTRAVENOUS
  Administered 2020-12-18 (×2): 120 ug via INTRAVENOUS
  Administered 2020-12-18: 80 ug via INTRAVENOUS
  Administered 2020-12-18: 120 ug via INTRAVENOUS

## 2020-12-18 MED ORDER — SUGAMMADEX SODIUM 200 MG/2ML IV SOLN
INTRAVENOUS | Status: DC | PRN
  Administered 2020-12-18: 200 mg via INTRAVENOUS

## 2020-12-18 MED ORDER — LACTATED RINGERS IV SOLN: INTRAVENOUS | Status: DC

## 2020-12-18 MED ORDER — PREDNISONE 20 MG PO TABS: 20.0000 mg | ORAL_TABLET | Freq: Once | ORAL | Status: DC

## 2020-12-18 MED ORDER — GLUCERNA SHAKE PO LIQD
237.0000 mL | Freq: Two times a day (BID) | ORAL | Status: DC
  Administered 2020-12-19 – 2020-12-22 (×8): 237 mL via ORAL

## 2020-12-18 MED ORDER — HEPARIN SODIUM (PORCINE) 5000 UNIT/ML IJ SOLN
5000.0000 [IU] | Freq: Three times a day (TID) | INTRAMUSCULAR | Status: DC
  Administered 2020-12-19 – 2020-12-22 (×9): 5000 [IU] via SUBCUTANEOUS
  Filled 2020-12-18 (×9): qty 1

## 2020-12-18 MED ORDER — ENSURE PRE-SURGERY PO LIQD
296.0000 mL | Freq: Once | ORAL | Status: DC
  Filled 2020-12-18: qty 296

## 2020-12-18 MED ORDER — DARIFENACIN HYDROBROMIDE ER 7.5 MG PO TB24
7.5000 mg | ORAL_TABLET | Freq: Every day | ORAL | Status: DC
Start: 2020-12-18 — End: 2020-12-23
  Administered 2020-12-18 – 2020-12-22 (×5): 7.5 mg via ORAL
  Filled 2020-12-18 (×5): qty 1

## 2020-12-18 MED ORDER — METOPROLOL TARTRATE 5 MG/5ML IV SOLN
INTRAVENOUS | Status: DC | PRN
  Administered 2020-12-18 (×2): 1 mg via INTRAVENOUS

## 2020-12-18 MED ORDER — HYDROMORPHONE HCL 1 MG/ML IJ SOLN
0.2500 mg | INTRAMUSCULAR | Status: DC | PRN
  Administered 2020-12-18: 0.5 mg via INTRAVENOUS
  Filled 2020-12-18: qty 0.5

## 2020-12-18 MED ORDER — FENTANYL CITRATE (PF) 250 MCG/5ML IJ SOLN
INTRAMUSCULAR | Status: AC
  Filled 2020-12-18: qty 5

## 2020-12-18 MED ORDER — DIPHENHYDRAMINE HCL 12.5 MG/5ML PO ELIX: 12.5000 mg | ORAL_SOLUTION | Freq: Four times a day (QID) | ORAL | Status: DC | PRN

## 2020-12-18 MED ORDER — BUPIVACAINE LIPOSOME 1.3 % IJ SUSP
INTRAMUSCULAR | Status: DC | PRN
  Administered 2020-12-18: 20 mL

## 2020-12-18 MED ORDER — ALVIMOPAN 12 MG PO CAPS
12.0000 mg | ORAL_CAPSULE | ORAL | Status: AC
  Administered 2020-12-18: 12 mg via ORAL

## 2020-12-18 MED ORDER — ALUM & MAG HYDROXIDE-SIMETH 200-200-20 MG/5ML PO SUSP: 30.0000 mL | Freq: Four times a day (QID) | ORAL | Status: DC | PRN

## 2020-12-18 MED ORDER — ALVIMOPAN 12 MG PO CAPS
12.0000 mg | ORAL_CAPSULE | Freq: Two times a day (BID) | ORAL | Status: DC
Start: 2020-12-19 — End: 2020-12-22
  Administered 2020-12-19 – 2020-12-22 (×6): 12 mg via ORAL
  Filled 2020-12-18 (×6): qty 1

## 2020-12-18 MED ORDER — ACETAMINOPHEN 500 MG PO TABS
1000.0000 mg | ORAL_TABLET | ORAL | Status: AC
  Administered 2020-12-18: 1000 mg via ORAL

## 2020-12-18 MED ORDER — GABAPENTIN 400 MG PO CAPS
400.0000 mg | ORAL_CAPSULE | Freq: Three times a day (TID) | ORAL | Status: DC
  Administered 2020-12-18 – 2020-12-23 (×15): 400 mg via ORAL
  Filled 2020-12-18 (×15): qty 1

## 2020-12-18 MED ORDER — HYDROCORTISONE NA SUCCINATE PF 100 MG IJ SOLR
INTRAMUSCULAR | Status: AC
  Filled 2020-12-18: qty 2

## 2020-12-18 MED ORDER — IPRATROPIUM-ALBUTEROL 0.5-2.5 (3) MG/3ML IN SOLN: 3.0000 mL | Freq: Four times a day (QID) | RESPIRATORY_TRACT | Status: DC | PRN

## 2020-12-18 MED ORDER — HEPARIN SODIUM (PORCINE) 5000 UNIT/ML IJ SOLN
INTRAMUSCULAR | Status: AC
  Filled 2020-12-18: qty 1

## 2020-12-18 MED ORDER — ENSURE PRE-SURGERY PO LIQD
592.0000 mL | Freq: Once | ORAL | Status: DC
  Filled 2020-12-18: qty 592

## 2020-12-18 MED ORDER — ONDANSETRON HCL 4 MG/2ML IJ SOLN
INTRAMUSCULAR | Status: DC | PRN
  Administered 2020-12-18 (×2): 4 mg via INTRAVENOUS

## 2020-12-18 MED ORDER — INSULIN ASPART 100 UNIT/ML IJ SOLN
0.0000 [IU] | Freq: Three times a day (TID) | INTRAMUSCULAR | Status: DC
  Administered 2020-12-19 (×3): 4 [IU] via SUBCUTANEOUS
  Administered 2020-12-20 (×3): 3 [IU] via SUBCUTANEOUS
  Administered 2020-12-21: 4 [IU] via SUBCUTANEOUS
  Administered 2020-12-21 – 2020-12-22 (×2): 3 [IU] via SUBCUTANEOUS
  Administered 2020-12-22: 7 [IU] via SUBCUTANEOUS
  Administered 2020-12-23: 4 [IU] via SUBCUTANEOUS
  Administered 2020-12-23: 3 [IU] via SUBCUTANEOUS

## 2020-12-18 MED ORDER — ORAL CARE MOUTH RINSE: 15.0000 mL | Freq: Once | OROMUCOSAL | Status: AC

## 2020-12-18 MED ORDER — DEXAMETHASONE SODIUM PHOSPHATE 4 MG/ML IJ SOLN
4.0000 mg | Freq: Once | INTRAMUSCULAR | Status: AC
  Administered 2020-12-18: 4 mg via INTRAVENOUS

## 2020-12-18 MED ORDER — DIPHENHYDRAMINE HCL 50 MG/ML IJ SOLN: 12.5000 mg | Freq: Four times a day (QID) | INTRAMUSCULAR | Status: DC | PRN

## 2020-12-18 MED ORDER — OXYCODONE HCL 5 MG PO TABS
5.0000 mg | ORAL_TABLET | ORAL | Status: DC | PRN
  Administered 2020-12-19: 10 mg via ORAL
  Administered 2020-12-19 – 2020-12-20 (×2): 5 mg via ORAL
  Filled 2020-12-18 (×2): qty 1
  Filled 2020-12-18: qty 2

## 2020-12-18 MED ORDER — LEVOTHYROXINE SODIUM 75 MCG PO TABS
175.0000 ug | ORAL_TABLET | Freq: Every morning | ORAL | Status: DC
  Administered 2020-12-19 – 2020-12-23 (×5): 175 ug via ORAL
  Filled 2020-12-18 (×5): qty 1

## 2020-12-18 MED ORDER — PANTOPRAZOLE SODIUM 40 MG PO TBEC
40.0000 mg | DELAYED_RELEASE_TABLET | Freq: Every day | ORAL | Status: DC
Start: 2020-12-18 — End: 2020-12-23
  Administered 2020-12-18 – 2020-12-23 (×6): 40 mg via ORAL
  Filled 2020-12-18 (×6): qty 1

## 2020-12-18 MED ORDER — ACETAMINOPHEN 500 MG PO TABS
ORAL_TABLET | ORAL | Status: AC
  Filled 2020-12-18: qty 2

## 2020-12-18 MED ORDER — INSULIN ASPART 100 UNIT/ML IJ SOLN
0.0000 [IU] | Freq: Four times a day (QID) | INTRAMUSCULAR | Status: DC
  Administered 2020-12-18: 3 [IU] via SUBCUTANEOUS
  Administered 2020-12-18 – 2020-12-19 (×2): 2 [IU] via SUBCUTANEOUS
  Administered 2020-12-19: 3 [IU] via SUBCUTANEOUS
  Administered 2020-12-20 (×2): 1 [IU] via SUBCUTANEOUS
  Administered 2020-12-21: 2 [IU] via SUBCUTANEOUS
  Administered 2020-12-21: 1 [IU] via SUBCUTANEOUS
  Administered 2020-12-22: 3 [IU] via SUBCUTANEOUS
  Administered 2020-12-22: 1 [IU] via SUBCUTANEOUS
  Administered 2020-12-23: 2 [IU] via SUBCUTANEOUS

## 2020-12-18 MED ORDER — SODIUM CHLORIDE 0.9 % IV SOLN
2.0000 g | INTRAVENOUS | Status: AC
  Administered 2020-12-18: 2 g via INTRAVENOUS
  Filled 2020-12-18: qty 2

## 2020-12-18 MED ORDER — PREDNISONE 20 MG PO TABS
40.0000 mg | ORAL_TABLET | Freq: Every day | ORAL | Status: AC
  Administered 2020-12-18 – 2020-12-19 (×2): 40 mg via ORAL
  Filled 2020-12-18 (×4): qty 2

## 2020-12-18 MED ORDER — POLYVINYL ALCOHOL 1.4 % OP SOLN: 2.0000 [drp] | Freq: Every day | OPHTHALMIC | Status: DC | PRN

## 2020-12-18 MED ORDER — ONDANSETRON HCL 4 MG/2ML IJ SOLN: 4.0000 mg | Freq: Four times a day (QID) | INTRAMUSCULAR | Status: DC | PRN

## 2020-12-18 MED ORDER — PROCHLORPERAZINE MALEATE 5 MG PO TABS
10.0000 mg | ORAL_TABLET | Freq: Four times a day (QID) | ORAL | Status: DC | PRN
  Administered 2020-12-18 – 2020-12-19 (×2): 10 mg via ORAL
  Filled 2020-12-18 (×3): qty 2

## 2020-12-18 MED ORDER — HYDROCORTISONE NA SUCCINATE PF 100 MG IJ SOLR
100.0000 mg | Freq: Four times a day (QID) | INTRAMUSCULAR | Status: DC
  Administered 2020-12-18: 100 mg via INTRAVENOUS

## 2020-12-18 SURGICAL SUPPLY — 57 items
CLOTH BEACON ORANGE TIMEOUT ST (SAFETY) ×2 IMPLANT
COVER LIGHT HANDLE STERIS (MISCELLANEOUS) ×8 IMPLANT
DRAPE HALF SHEET 40X57 (DRAPES) ×1 IMPLANT
DRSG OPSITE POSTOP 4X10 (GAUZE/BANDAGES/DRESSINGS) ×1 IMPLANT
DRSG TEGADERM 4X4.75 (GAUZE/BANDAGES/DRESSINGS) ×2 IMPLANT
ELECT BLADE 6 FLAT ULTRCLN (ELECTRODE) ×3 IMPLANT
ELECT REM PT RETURN 9FT ADLT (ELECTROSURGICAL) ×2
ELECTRODE REM PT RTRN 9FT ADLT (ELECTROSURGICAL) ×1 IMPLANT
GAUZE SPONGE 4X4 12PLY STRL (GAUZE/BANDAGES/DRESSINGS) ×2 IMPLANT
GLOVE BIO SURGEON STRL SZ8 (GLOVE) ×2 IMPLANT
GLOVE SURG ENC MOIS LTX SZ6.5 (GLOVE) ×4 IMPLANT
GLOVE SURG SS PI 7.5 STRL IVOR (GLOVE) ×4 IMPLANT
GLOVE SURG UNDER POLY LF SZ6.5 (GLOVE) ×4 IMPLANT
GLOVE SURG UNDER POLY LF SZ7 (GLOVE) ×6 IMPLANT
GOWN STRL REUS W/TWL LRG LVL3 (GOWN DISPOSABLE) ×12 IMPLANT
INST SET MAJOR GENERAL (KITS) ×2 IMPLANT
KIT TURNOVER KIT A (KITS) ×2 IMPLANT
LIGASURE IMPACT 36 18CM CVD LR (INSTRUMENTS) ×2 IMPLANT
MANIFOLD NEPTUNE II (INSTRUMENTS) ×2 IMPLANT
NDL HYPO 18GX1.5 BLUNT FILL (NEEDLE) ×1 IMPLANT
NEEDLE HYPO 18GX1.5 BLUNT FILL (NEEDLE) ×2 IMPLANT
NS IRRIG 1000ML POUR BTL (IV SOLUTION) ×7 IMPLANT
PACK COLON (CUSTOM PROCEDURE TRAY) ×2 IMPLANT
PAD ARMBOARD 7.5X6 YLW CONV (MISCELLANEOUS) ×2 IMPLANT
PENCIL SMOKE EVACUATOR (MISCELLANEOUS) ×1 IMPLANT
PENCIL SMOKE EVACUATOR COATED (MISCELLANEOUS) ×2 IMPLANT
RELOAD LINEAR CUT PROX 55 BLUE (ENDOMECHANICALS) IMPLANT
RELOAD PROXIMATE 75MM BLUE (ENDOMECHANICALS) ×2 IMPLANT
RELOAD STAPLE 55 3.8 BLU REG (ENDOMECHANICALS) IMPLANT
RELOAD STAPLE 75 3.8 BLU REG (ENDOMECHANICALS) IMPLANT
RETRACTOR WND ALEXIS-O 25 LRG (MISCELLANEOUS) ×1 IMPLANT
RETRACTOR WOUND ALXS 34CM XLRG (MISCELLANEOUS) IMPLANT
RTRCTR WOUND ALEXIS 34CM XLRG (MISCELLANEOUS) ×2
RTRCTR WOUND ALEXIS O 25CM LRG (MISCELLANEOUS) ×2
SHEET LAVH (DRAPES) ×1 IMPLANT
SOLUTION EZ SCRUB FOAM 32OZ (MISCELLANEOUS) ×1 IMPLANT
SPONGE T-LAP 18X18 ~~LOC~~+RFID (SPONGE) ×7 IMPLANT
STAPLER AUT SUT 4.8 EEAXL 31 (STAPLE) IMPLANT
STAPLER CIRC CVD 29MM 37CM (STAPLE) IMPLANT
STAPLER CVD CUT GN 40 RELOAD (ENDOMECHANICALS) ×2 IMPLANT
STAPLER CVD CUT GRN 40 RELOAD (ENDOMECHANICALS) IMPLANT
STAPLER GUN LINEAR PROX 60 (STAPLE) IMPLANT
STAPLER PROXIMATE 55 BLUE (STAPLE) IMPLANT
STAPLER PROXIMATE 75MM BLUE (STAPLE) ×1 IMPLANT
STAPLER VISISTAT (STAPLE) ×3 IMPLANT
SUT PDS AB CT VIOLET #0 27IN (SUTURE) ×4 IMPLANT
SUT PROLENE 2 0 SH 30 (SUTURE) ×2 IMPLANT
SUT SILK 0 FSL (SUTURE) ×2 IMPLANT
SUT SILK 3 0 SH CR/8 (SUTURE) ×3 IMPLANT
SUT VIC AB 0 CT1 27 (SUTURE) ×4
SUT VIC AB 0 CT1 27XCR 8 STRN (SUTURE) ×1 IMPLANT
SYR 20ML LL LF (SYRINGE) ×1 IMPLANT
SYR BULB IRRIG 60ML STRL (SYRINGE) ×1 IMPLANT
TRAY FOLEY W/BAG SLVR 16FR (SET/KITS/TRAYS/PACK) ×2
TRAY FOLEY W/BAG SLVR 16FR ST (SET/KITS/TRAYS/PACK) ×1 IMPLANT
TUBE CONNECTING 12X1/4 (SUCTIONS) ×1 IMPLANT
YANKAUER SUCT BULB TIP 10FT TU (MISCELLANEOUS) ×2 IMPLANT

## 2020-12-18 NOTE — Anesthesia Postprocedure Evaluation (Signed)
Anesthesia Post Note  Patient: Ann [name]  Procedure(s) Performed: COLOSTOMY REVERSAL (Abdomen)  Patient location during evaluation: PACU Anesthesia Type: General Level of consciousness: awake and alert and oriented Pain management: pain level controlled Vital Signs Assessment: post-procedure vital signs reviewed and stable Respiratory status: spontaneous breathing, respiratory function stable and patient connected to face mask oxygen Cardiovascular status: blood pressure returned to baseline and stable Postop Assessment: no headache, no backache and no apparent nausea or vomiting (nausea improved, patient sleeping) Anesthetic complications: no   No notable events documented.   Last Vitals:  Vitals:   12/18/20 1330 12/18/20 1345  BP: 112/77 98/75  Pulse: (!) 52 (!) 48  Resp: 10 15  Temp:    SpO2: 98% 100%    Last Pain:  Vitals:   12/18/20 1345  PainSc: Asleep                 Avagrace Botelho C Dhanvin Szeto

## 2020-12-18 NOTE — Progress Notes (Signed)
[facility]  Colostomy reversal completed. Notified husband.  Admission to [facility] for monitoring. Scheduled tylenol, PRN narcotics for pain Respiratory consulted, IS, COPD at baseline, home O2 at times Steroid taper given 10 mg chronically, discussed with pharmacy and they will help put that in for stress dose post op and quickly back to her home  Home cardizem ordered  Clear diet for now Foley overnight, LR @ 50 Labs in the AM Levemir 25 mg qhs (1/2 of home dose) and SSI ordered Home bladder meds ordered  SCDs, heparin sq Asked hospitalist to consult to help with medical management given complicated patient   Binder ordered.  Curlene Labrum, MD [facility] [address] [place], [place] [address] [phone] (office)
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
## 2020-12-18 NOTE — Transfer of Care (Signed)
Immediate Anesthesia Transfer of Care Note  Patient: Ann [name]  Procedure(s) Performed: COLOSTOMY REVERSAL (Abdomen)  Patient Location: PACU  Anesthesia Type:General  Level of Consciousness: awake, alert  and patient cooperative  Airway & Oxygen Therapy: Patient Spontanous Breathing and Patient connected to face mask oxygen  Post-op Assessment: Report given to RN, Post -op Vital signs reviewed and stable and pt is on neo gtt, weaning off   Post vital signs: Reviewed and stable  Last Vitals:  Vitals Value Taken Time  BP 125/96 12/18/20 1254  Temp    Pulse 82 12/18/20 1258  Resp 21 12/18/20 1258  SpO2 91 % 12/18/20 1258  Vitals shown include unvalidated device data.  Last Pain:  Vitals:   12/18/20 0831  PainSc: 0-No pain         Complications: No notable events documented.

## 2020-12-18 NOTE — Op Note (Addendum)
[facility] Operative Note  12/18/20  Preoperative Diagnosis: Colostomy in place    Postoperative Diagnosis: Same   Procedure(s) Performed: Colostomy reversal with left colectomy    Surgeon: Lanell Matar. Constance Haw, MD   Assistants: Aviva Signs, MD    Anesthesia: General endotracheal   Anesthesiologist: Denese Killings, MD    Specimens: Left colon   Estimated Blood Loss: Minimal   Blood Replacement: None    Complications: None   Wound Class: Contaminated    Operative Indications: Ms. Herrera is a 75 yo who had a perforated diverticulitis 04/2020 who had to get a end colostomy after perforation. She has had some issues with colonic stenosis that was managed with dilation in the office, and otherwise did well. She had a colonoscopy that demonstrated no other pathology, she had a cardiac workup with stress test that was negative.  We discussed reversal and risk of bleeding, infection, leak, injury to other organs, ureter injury.   Findings: Healthy appearing colon and stump, parastomal hernia sac    Procedure: The patient was taken to the operating room and placed supine. General endotracheal anesthesia was induced. Intravenous antibiotics were administered per protocol.  An orogastric tube positioned to decompress the stomach. She was then placed in lithotomy with all pressure points padded.  The end colostomy was oversewn to close it with a running 3-0 Silk.  The abdomen and peritoneum were prepared and draped in the usual sterile fashion.   The midline scar was incised and excess scar was removed. The fascia was entered with care using scissors. Once the fascia was entered the midline was opened inferiorly and superiorly.  The small bowel was packed in the right upper quadrant.  The patietn was placed in Trendelenburg position. The stump was healthy and only minimally scarred into the pelvis.  The ostomy had minor adhesions in the left lower abdomen.  In order  to get a wound protector in place, an incision was made around the ostomy and the ostomy was fully mobilized down to the fascia with care.  Once this was fully mobilized, the ostomy was brought into the abdomen. The wound protector was placed.  The remaining scar in the left upper quadrant was mobilized to free up the ostomy further. There was significant length of colon, and a minor serosal tear.  I opted to take the left colon/ end colostomy site to ensure that the colon was viable and the mesentery was not compromised. An enlarged node was also excised and sent for specimen.  The left colon was taken with a 75 mm linear cutting stapler.  The stump was then mobilized up by scoring the peritoneum. The right and left ureter were protected. The remaining splenic flexure was take down with cautery and blunt dissection. The proximal colon reached easily into the pelvis.  From here the two ends were placed side to side and a side to side anastomosis was done in the antimesenteric portion of the colon using a linear 75 mm cutting stapler.  TIA contour was used to close the common colotomy. The staple line was oversewn with a 3-0 Silk sutures and two 3-0 Vicryl sutures were placed in the crotch.    The anastomosis was healthy and without tension, and fell easily into the pelvis. The abdomen was irrigated. The laparotomy pads were removed and the small bowel was unpacked.  The posterior fascia of the ostomy site was closed including the hernia sac with interrupted 0 Vicryl suture.    The  entire team changed gown and glove and closing equipment.  The anterior fascia of the colostomy site was closed with 0 interrupted Vicryl sutures.  The dead space was closed with 0 Vicryl. The midline was closed with 0 PDS in the standard fashion. The skin of the ostomy site and midline were  closed with staples. Sterile dressings were placed.   Dr. Arnoldo Morale was assisting throughout the procedure and was present for the critical  portions of the case.   Final inspection revealed acceptable hemostasis. All counts were correct at the end of the case. The patient was awakened from anesthesia and extubated without complication.  The patient went to the PACU in stable condition.   Curlene Labrum, MD [facility] [address] [place], [place] [address] [phone] (office)

## 2020-12-18 NOTE — Interval H&P Note (Signed)
History and Physical Interval Note:  12/18/2020 9:21 AM  Ann [name]  has presented today for surgery, with the diagnosis of Colostomy in place.  The various methods of treatment have been discussed with the patient and family. After consideration of risks, benefits and other options for treatment, the patient has consented to  Procedure(s): COLOSTOMY REVERSAL (N/A) as a surgical intervention.  The patient's history has been reviewed, patient examined, no change in status, stable for surgery.  I have reviewed the patient's chart and labs.  Questions were answered to the patient's satisfaction.    Completed prep.  Virl Cagey

## 2020-12-18 NOTE — Anesthesia Preprocedure Evaluation (Signed)
Anesthesia Evaluation  Patient identified by MRN, date of birth, ID band Patient awake    Reviewed: Allergy & Precautions, NPO status , Patient's Chart, lab work & pertinent test results  History of Anesthesia Complications Negative for: history of anesthetic complications  Airway Mallampati: III  TM Distance: >3 FB Neck ROM: Full    Dental  (+) Edentulous Upper, Missing, Dental Advisory Given   Pulmonary sleep apnea , COPD,  oxygen dependent, former smoker,  On 2 L of oxygen,    Pulmonary exam normal breath sounds clear to auscultation       Cardiovascular Exercise Tolerance: Poor hypertension, Pt. on medications + angina with exertion + CAD, + Cardiac Stents and +CHF  + dysrhythmias Atrial Fibrillation  Rhythm:Irregular Rate:Tachycardia - Systolic murmurs, - Diastolic murmurs and - Friction Rub 1. Left ventricular ejection fraction, by estimation, is 60 to 65%. The left ventricle has normal function. The left ventricle has no regional wall motion abnormalities. There is moderate left ventricular hypertrophy.  Left ventricular diastolic parameters are indeterminate.  2. Right ventricular systolic function is normal. The right ventricular size is normal.  3. Left atrial size was mildly dilated.  4. The mitral valve is normal in structure. No evidence of mitral valve regurgitation. No evidence of mitral stenosis.  5. The aortic valve has an indeterminant number of cusps. Aortic valve regurgitation is not visualized. No aortic stenosis is present.  6. The inferior vena cava is normal in size with greater than 50%  respiratory variability, suggesting right atrial pressure of 3 mmHg.  14-May-2020 11:44:48 [facility] ROUTINE RECORD Atrial fibrillation Low voltage, extremity and precordial leads Non-specific ST-t changes Confirmed by Lajean Saver [phone]) on 05/14/2020 11:52:08 AM    Neuro/Psych Anxiety   Neuromuscular disease    GI/Hepatic Neg liver ROS, hiatal hernia, PUD, GERD  Medicated and Controlled,Colostomy    Endo/Other  diabetes, Well Controlled, Type 2, Insulin DependentHypothyroidism Morbid obesityAdrenal insufficiency   Renal/GU Renal disease     Musculoskeletal  (+) Arthritis ,   Abdominal   Peds  Hematology  (+) anemia ,   Anesthesia Other Findings Stress test -10/2020 - negative   Reproductive/Obstetrics negative OB ROS                            Anesthesia Physical  Anesthesia Plan  ASA: 4  Anesthesia Plan: General   Post-op Pain Management:    Induction: Intravenous  PONV Risk Score and Plan: 4 or greater and Ondansetron and Dexamethasone  Airway Management Planned: Oral ETT  Additional Equipment:   Intra-op Plan:   Post-operative Plan: Possible Post-op intubation/ventilation and Extubation in OR  Informed Consent: I have reviewed the patients History and Physical, chart, labs and discussed the procedure including the risks, benefits and alternatives for the proposed anesthesia with the patient or authorized representative who has indicated his/her understanding and acceptance.    Discussed DNR with patient and Suspend DNR.     Plan Discussed with: Surgeon and CRNA  Anesthesia Plan Comments: (Will give stress dose steroids)        Anesthesia Quick Evaluation

## 2020-12-19 ENCOUNTER — Encounter (HOSPITAL_COMMUNITY): Payer: Self-pay | Admitting: General Surgery

## 2020-12-19 DIAGNOSIS — K9409 Other complications of colostomy: Secondary | ICD-10-CM

## 2020-12-19 DIAGNOSIS — I4891 Unspecified atrial fibrillation: Secondary | ICD-10-CM | POA: Diagnosis not present

## 2020-12-19 DIAGNOSIS — Z933 Colostomy status: Secondary | ICD-10-CM | POA: Diagnosis not present

## 2020-12-19 DIAGNOSIS — E1165 Type 2 diabetes mellitus with hyperglycemia: Secondary | ICD-10-CM | POA: Diagnosis not present

## 2020-12-19 DIAGNOSIS — I25118 Atherosclerotic heart disease of native coronary artery with other forms of angina pectoris: Secondary | ICD-10-CM

## 2020-12-19 DIAGNOSIS — I10 Essential (primary) hypertension: Secondary | ICD-10-CM

## 2020-12-19 DIAGNOSIS — Z794 Long term (current) use of insulin: Secondary | ICD-10-CM

## 2020-12-19 LAB — CBC WITH DIFFERENTIAL/PLATELET
Abs Immature Granulocytes: 0.1 10*3/uL — ABNORMAL HIGH (ref 0.00–0.07)
Basophils Absolute: 0 10*3/uL (ref 0.0–0.1)
Basophils Relative: 0 %
Eosinophils Absolute: 0 10*3/uL (ref 0.0–0.5)
Eosinophils Relative: 0 %
HCT: 35.9 % — ABNORMAL LOW (ref 36.0–46.0)
Hemoglobin: 10.4 g/dL — ABNORMAL LOW (ref 12.0–15.0)
Immature Granulocytes: 1 %
Lymphocytes Relative: 5 %
Lymphs Abs: 0.8 10*3/uL (ref 0.7–4.0)
MCH: 23.9 pg — ABNORMAL LOW (ref 26.0–34.0)
MCHC: 29 g/dL — ABNORMAL LOW (ref 30.0–36.0)
MCV: 82.5 fL (ref 80.0–100.0)
Monocytes Absolute: 0.6 10*3/uL (ref 0.1–1.0)
Monocytes Relative: 4 %
Neutro Abs: 14.2 10*3/uL — ABNORMAL HIGH (ref 1.7–7.7)
Neutrophils Relative %: 90 %
Platelets: 255 10*3/uL (ref 150–400)
RBC: 4.35 MIL/uL (ref 3.87–5.11)
RDW: 17.7 % — ABNORMAL HIGH (ref 11.5–15.5)
WBC: 15.7 10*3/uL — ABNORMAL HIGH (ref 4.0–10.5)
nRBC: 0 % (ref 0.0–0.2)

## 2020-12-19 LAB — GLUCOSE, CAPILLARY
Glucose-Capillary: 203 mg/dL — ABNORMAL HIGH (ref 70–99)
Glucose-Capillary: 195 mg/dL — ABNORMAL HIGH (ref 70–99)
Glucose-Capillary: 182 mg/dL — ABNORMAL HIGH (ref 70–99)
Glucose-Capillary: 182 mg/dL — ABNORMAL HIGH (ref 70–99)
Glucose-Capillary: 190 mg/dL — ABNORMAL HIGH (ref 70–99)

## 2020-12-19 LAB — MAGNESIUM: Magnesium: 1.6 mg/dL — ABNORMAL LOW (ref 1.7–2.4)

## 2020-12-19 LAB — BASIC METABOLIC PANEL
Anion gap: 10 (ref 5–15)
BUN: 18 mg/dL (ref 8–23)
CO2: 24 mmol/L (ref 22–32)
Calcium: 8.8 mg/dL — ABNORMAL LOW (ref 8.9–10.3)
Chloride: 100 mmol/L (ref 98–111)
Creatinine, Ser: 0.87 mg/dL (ref 0.44–1.00)
GFR, Estimated: >60 mL/min (ref >60)
Glucose, Bld: 209 mg/dL — ABNORMAL HIGH (ref 70–99)
Potassium: 5 mmol/L (ref 3.5–5.1)
Sodium: 134 mmol/L — ABNORMAL LOW (ref 135–145)

## 2020-12-19 LAB — SURGICAL PATHOLOGY

## 2020-12-19 LAB — PHOSPHORUS: Phosphorus: 4.7 mg/dL — ABNORMAL HIGH (ref 2.5–4.6)

## 2020-12-19 MED ORDER — MAGNESIUM SULFATE 4 GM/100ML IV SOLN
4.0000 g | Freq: Once | INTRAVENOUS | Status: AC
  Administered 2020-12-19: 4 g via INTRAVENOUS
  Filled 2020-12-19: qty 100

## 2020-12-19 MED ORDER — INSULIN ASPART 100 UNIT/ML IJ SOLN
6.0000 [IU] | Freq: Three times a day (TID) | INTRAMUSCULAR | Status: DC
  Administered 2020-12-19 – 2020-12-23 (×14): 6 [IU] via SUBCUTANEOUS

## 2020-12-19 NOTE — Plan of Care (Signed)
  Problem: Acute Rehab PT Goals(only PT should resolve) Goal: Pt Will Go Supine/Side To Sit Outcome: Progressing Flowsheets (Taken 12/19/2020 1200) Pt will go Supine/Side to Sit:  with supervision  with min guard assist Goal: Patient Will Transfer Sit To/From Stand Outcome: Progressing Flowsheets (Taken 12/19/2020 1200) Patient will transfer sit to/from stand:  with supervision  with modified independence Goal: Pt Will Transfer Bed To Chair/Chair To Bed Outcome: Progressing Flowsheets (Taken 12/19/2020 1200) Pt will Transfer Bed to Chair/Chair to Bed:  with supervision  with modified independence Goal: Pt Will Ambulate Outcome: Progressing Flowsheets (Taken 12/19/2020 1200) Pt will Ambulate:  75 feet  with modified independence  with supervision  with rolling walker   12:01 PM, 12/19/20 Lonell Grandchild, MPT Physical Therapist with [facility] [phone] office [phone] mobile phone

## 2020-12-19 NOTE — Evaluation (Signed)
Physical Therapy Evaluation Patient Details Name: Ann [name] MRN: [id number] DOB: 1946-04-13 Today's Date: 12/19/2020   History of Present Illness  Ann [name] is a 75 y/o female, s/p Colostomy reversal with left colectomy on 12/18/20, with the diagnosis of Colostomy in place.   Clinical Impression  Patient functioning near baseline for functional mobility and gait, other than requiring increased time and Min assist to sit up at bedside with minor discomfort at surgical site.  Patient able to ambulate in room/hallway without loss of balance, limited mostly due to c/o fatigue with HR increasing above 150 bpm, RN aware and patient tolerated sitting up in chair after therapy with her daughter present in room.  Patient will benefit from continued physical therapy in hospital and recommended venue below to increase strength, balance, endurance for safe ADLs and gait.      Follow Up Recommendations Home health PT;Supervision for mobility/OOB;Supervision - Intermittent    Equipment Recommendations  None recommended by PT    Recommendations for Other Services       Precautions / Restrictions Precautions Precautions: Fall Precaution Comments: Home O2 dependent 2 LPM Restrictions Weight Bearing Restrictions: No      Mobility  Bed Mobility Overal bed mobility: Needs Assistance Bed Mobility: Supine to Sit     Supine to sit: Min assist     General bed mobility comments: increased time, labored movement    Transfers Overall transfer level: Needs assistance Equipment used: Rolling walker (2 wheeled) Transfers: Sit to/from [facility] Sit to Stand: Min guard Stand pivot transfers: Min guard       General transfer comment: slow labored movement  Ambulation/Gait Ambulation/Gait assistance: Min guard Gait Distance (Feet): 45 Feet Assistive device: Rolling walker (2 wheeled) Gait Pattern/deviations: Decreased step length - right;Decreased step length -  left;Decreased stride length;Trunk flexed Gait velocity: decreased   General Gait Details: slow labored cadence without loss of balance, on 2 LPM with SpO2 at 93%, HR increased above 150 bpm, limited mostly due to fatigue  Stairs            Wheelchair Mobility    Modified Rankin (Stroke Patients Only)       Balance Overall balance assessment: Needs assistance Sitting-balance support: Feet supported;No upper extremity supported Sitting balance-Leahy Scale: Good Sitting balance - Comments: seated at EOB   Standing balance support: During functional activity;No upper extremity supported Standing balance-Leahy Scale: Fair Standing balance comment: using RW                             Pertinent Vitals/Pain Pain Assessment: No/denies pain    Home Living Family/patient expects to be discharged to:: Private residence Living Arrangements: Spouse/significant other Available Help at Discharge: Family;Available 24 hours/day Type of Home: House Home Access: Ramped entrance     Home Layout: Two level;Able to live on main level with bedroom/bathroom Home Equipment: Walker - 4 wheels;Bedside commode;Shower seat;Grab bars - toilet;Grab bars - tub/shower;Wheelchair - manual;Hospital bed Additional Comments: has grab bars, shower seat, and hand held shower head in bathroom    Prior Function Level of Independence: Needs assistance   Gait / Transfers Assistance Needed: household ambulator using Rollator  ADL's / Homemaking Assistance Needed: Pt reports husband supervises for safety but she is able to complete basic ADLs independently. Family assists with meal preparation, housekeeping        Hand Dominance   Dominant Hand: Right    Extremity/Trunk Assessment  Upper Extremity Assessment Upper Extremity Assessment: Overall WFL for tasks assessed    Lower Extremity Assessment Lower Extremity Assessment: Generalized weakness    Cervical / Trunk  Assessment Cervical / Trunk Assessment: Kyphotic  Communication   Communication: No difficulties  Cognition Arousal/Alertness: Awake/alert Behavior During Therapy: WFL for tasks assessed/performed Overall Cognitive Status: Within Functional Limits for tasks assessed                                        General Comments      Exercises     Assessment/Plan    PT Assessment Patient needs continued PT services  PT Problem List Decreased strength;Decreased activity tolerance;Decreased balance;Decreased mobility       PT Treatment Interventions DME instruction;Gait training;Stair training;Functional mobility training;Therapeutic activities;Therapeutic exercise;Patient/family education;Balance training    PT Goals (Current goals can be found in the Care Plan section)  Acute Rehab PT Goals Patient Stated Goal: return home with family to assist PT Goal Formulation: With patient/family Time For Goal Achievement: 12/26/20 Potential to Achieve Goals: Good    Frequency Min 3X/week   Barriers to discharge        Co-evaluation               AM-PAC PT "6 Clicks" Mobility  Outcome Measure Help needed turning from your back to your side while in a flat bed without using bedrails?: A Little Help needed moving from lying on your back to sitting on the side of a flat bed without using bedrails?: A Little Help needed moving to and from a bed to a chair (including a wheelchair)?: A Little Help needed standing up from a chair using your arms (e.g., wheelchair or bedside chair)?: A Little Help needed to walk in hospital room?: A Little Help needed climbing 3-5 steps with a railing? : A Lot 6 Click Score: 17    End of Session Equipment Utilized During Treatment: Oxygen Activity Tolerance: Patient tolerated treatment well Patient left: in chair;with call bell/phone within reach;with family/visitor present Nurse Communication: Mobility status PT Visit Diagnosis:  Unsteadiness on feet (R26.81);Other abnormalities of gait and mobility (R26.89);Muscle weakness (generalized) (M62.81)    Time: [id number] PT Time Calculation (min) (ACUTE ONLY): 23 min   Charges:   PT Evaluation $PT Eval Moderate Complexity: 1 Mod PT Treatments $Therapeutic Activity: 23-37 mins        11:59 AM, 12/19/20 Lonell Grandchild, MPT Physical Therapist with [facility] [phone] office [phone] mobile phone

## 2020-12-19 NOTE — Progress Notes (Signed)
[facility] Progress Note  1 Day Post-Op  Subjective: Doing well and in chair. Tolerating clears/ some flatus but no BM.   Objective: Vital signs in last 24 hours: Temp:  [93.3 F (34.1 C)-98.7 F (37.1 C)] 97.5 F (36.4 C) (07/26 1617) Pulse Rate:  [36-103] 89 (07/26 1617) Resp:  [12-25] 21 (07/26 1617) BP: (85-146)/(52-103) 98/67 (07/26 1200) SpO2:  [91 %-100 %] 95 % (07/26 1617) Weight:  [108.2 kg] 108.2 kg (07/26 0123)    Intake/Output from previous day: 07/25 0701 - 07/26 0700 In: 4486.7 [P.O.:240; I.V.:4046.7; IV Piggyback:200] Out: 495 [Urine:395; Blood:100] Intake/Output this shift: Total I/O In: 120 [P.O.:120] Out: 600 [Urine:600]  General appearance: alert, cooperative, and no distress Resp: home O2, normal work breathing GI: soft, obese, appropriately tender, dressings c/d/I with no drainage  Lab Results:  Recent Labs    12/18/20 1825 12/19/20 0340  WBC 22.5* 15.7*  HGB 11.7* 10.4*  HCT 40.0 35.9*  PLT 261 255   BMET Recent Labs    12/18/20 1825 12/19/20 0340  NA 133* 134*  K 4.4 5.0  CL 100 100  CO2 25 24  GLUCOSE 236* 209*  BUN 16 18  CREATININE 0.79 0.87  CALCIUM 8.7* 8.8*   PT/INR No results for input(s): LABPROT, INR in the last 72 hours.  Studies/Results: No results found.  Anti-infectives: Anti-infectives (From admission, onward)    Start     Dose/Rate Route Frequency Ordered Stop   12/18/20 2200  cefoTEtan (CEFOTAN) 2 g in sodium chloride 0.9 % 100 mL IVPB        2 g 200 mL/hr over 30 Minutes Intravenous Every 12 hours 12/18/20 1703 12/19/20 1101   12/18/20 0900  cefoTEtan (CEFOTAN) 2 g in sodium chloride 0.9 % 100 mL IVPB        2 g 200 mL/hr over 30 Minutes Intravenous On call to O.R. 12/18/20 [id number] 12/18/20 0933       Assessment/Plan: Ms. Bas is a 75 yo s/p colostomy reversal doing well.  Scheduled tylenol for pain, PRN narcotics IS, OOB COPD inhalers Steroid taper to home dose  Home  cardizem Clear diet DM insulin ordered,  Await bowel function Labs reviewed, H&H drifted but expected, leukocytosis down post op UOP clearing up, Cr ok, will dc foley ,mag replaced, labs in AM  SCDs, heparin  Stepdown bed today  Appreciate hospitalist assistance    LOS: 1 day    Virl Cagey 12/19/2020

## 2020-12-19 NOTE — TOC Initial Note (Signed)
Transition of Care [facility]) - Initial/Assessment Note    Patient Details  Name: Ann [name] MRN: [id number] Date of Birth: Sep 12, 1945  [facility] of [facility] CM/SW Contact:    Iona Beard, [place] Phone Number: 12/19/2020, 4:04 PM  Clinical Narrative:                 TOC updated that PT is recommending [facility] for pt. CSW spoke with pt to complete assessment. Pt lives with her husband, multiple of her children also live with her. Pt is able to complete ADLs independently, her husband does stay nearby to ensure she does not fall. Pts son provides transportation when needed. Pt states that she has a rollator and wheelchair to use when needed. Pt states that she has had [place] services in the past with [facility]. Pt requests to use [facility] and is agreeable to [facility] services. TOC to follow.   Expected Discharge Plan: [place] Barriers to Discharge: Continued Medical Work up   Patient Goals and CMS Choice Patient states their goals for this hospitalization and ongoing recovery are:: Home with [facility] CMS Medicare.gov Compare Post Acute Care list provided to:: Patient Choice offered to / list presented to : Patient  Expected Discharge Plan and Services Expected Discharge Plan: [place] In-house Referral: Clinical Social Work Discharge Planning Services: CM Consult Post Acute Care Choice: [place] arrangements for the past 2 months: Single Family Home                                      Prior Living Arrangements/Services Living arrangements for the past 2 months: Single Family Home Lives with:: Adult Children, Spouse Patient language and need for interpreter reviewed:: Yes Do you feel safe going back to the place where you live?: Yes      Need for Family Participation in Patient Care: Yes (Comment) Care giver support system in place?: Yes (comment) Current home services: DME Criminal Activity/Legal Involvement Pertinent to  Current Situation/Hospitalization: No - Comment as needed  Activities of Daily Living Home Assistive Devices/Equipment: CBG Meter, Dentures (specify type), Walker (specify type) ADL Screening (condition at time of admission) Patient's cognitive ability adequate to safely complete daily activities?: Yes Is the patient deaf or have difficulty hearing?: No Does the patient have difficulty seeing, even when wearing glasses/contacts?: No Does the patient have difficulty concentrating, remembering, or making decisions?: No Patient able to express need for assistance with ADLs?: Yes Does the patient have difficulty dressing or bathing?: No Independently performs ADLs?: Yes (appropriate for developmental age) Does the patient have difficulty walking or climbing stairs?: No Weakness of Legs: Both Weakness of Arms/Hands: None  Permission Sought/Granted                  Emotional Assessment Appearance:: Appears stated age Attitude/Demeanor/Rapport: Engaged Affect (typically observed): Accepting Orientation: : Oriented to Self, Oriented to Place, Oriented to  Time, Oriented to Situation Alcohol / Substance Use: Not Applicable Psych Involvement: No (comment)  Admission diagnosis:  History of colostomy reversal [Z98.890] Patient Active Problem List   Diagnosis Date Noted   History of colostomy reversal 12/18/2020   Abnormality of gait 07/04/2020   Colostomy in place [facility]) 06/13/2020   Colostomy, retracted ([place]) 06/13/2020   Labile blood glucose    Supplemental oxygen dependent    Steroid-induced hyperglycemia    Controlled type  2 diabetes mellitus with hyperglycemia, with long-term current use of insulin (HCC)    Hypoalbuminemia due to protein-calorie malnutrition (HCC)    Chronic diastolic congestive heart failure (HCC)    Acute blood loss anemia    Debility 05/25/2020   Sigmoid Diverticulitis with Perforation-- 05/15/2020   Diverticulitis of large intestine with perforation with  bleeding    Sepsis Due to Sigmoid Diverticulitis with Perforation 05/14/2020   Acute GI bleeding 05/14/2020   Acute on chronic anemia-Due to Acute Blood Loss 05/14/2020   Acute respiratory failure with hypoxia ([place]) 01/24/2020   Acute on chronic diastolic CHF (congestive heart failure) ([place]) 01/24/2020   Atrial fibrillation with RVR ([place]) 01/23/2020   Anxiety    Type 2 diabetes mellitus ([place])    Weakness 12/10/2019   Acute pain of left knee 08/31/2019   History of DVT (deep vein thrombosis)    Coronary artery disease of native artery of native heart with stable angina pectoris (HCC)    Chronic obstructive pulmonary disease ([place])    Atrial fibrillation with rapid ventricular response ([place]) 07/13/2019   COPD exacerbation ([place]) 04/04/2018   Comprehensive diabetic foot examination, type 2 DM, encounter for ([place]) 04/04/2018   PAF (paroxysmal atrial fibrillation) ([place]) 04/04/2018   Constipation by delayed colonic transit 10/23/2016   Fatty liver 12/11/2015   GERD (gastroesophageal reflux disease) 07/14/2015   VRE (vancomycin resistant enterococcus) culture positive 10/10/2012   Nonsustained ventricular tachycardia ([place]) 10/10/2012   Cellulitis of arm, right 10/06/2012   Hydronephrosis 09/15/2012   Abdominal mass, right lower quadrant 09/10/2012   Microcytic anemia 08/22/2012   Acute on chronic diastolic heart failure ([place]) 08/06/2012   UTI (urinary tract infection) 07/30/2012   Atrial fibrillation ([place]) 07/02/2012   Vitamin B12 deficiency 06/28/2012   Sinusitis chronic, frontal 06/28/2012   Obesity, Class III, BMI 40-49.9 (morbid obesity) ([place]) 06/28/2012   Chronic kidney disease, stage III (moderate) ([place]) 06/28/2012   ARF (acute renal failure) ([place]) 06/27/2012   Adrenal insufficiency ([place]) 01/07/2012   Uncontrolled type 2 diabetes mellitus with hyperglycemia ([place]) 04/19/2011   Chronic diastolic heart failure ([place]) 04/19/2011   GANGLION OF TENDON SHEATH 02/21/2010   Acquired  hypothyroidism 12/01/2008   Mixed hyperlipidemia 12/01/2008   Essential hypertension 12/01/2008   Coronary atherosclerosis 12/01/2008   RENAL FAILURE, CHRONIC 12/01/2008   PCP:  Rosita Fire, MD Pharmacy:   [place], [place] [address] AT [address] [place] [address] Phone: [phone] Fax: [phone]  [facility] [place], [place] [address] [place] [address] Phone: [phone] Fax: [phone]     Social Determinants of Health (SDOH) Interventions    Readmission Risk Interventions Readmission Risk Prevention Plan 12/19/2020 05/16/2020 01/24/2020  Transportation Screening Complete Complete Complete  HRI or Home Care Consult - - Complete  Social Work Consult for [place] Planning/Counseling - - Complete  Palliative Care Screening - - Not Applicable  Medication Review Press photographer) Complete Complete Complete  HRI or Home Care Consult Complete Complete -  SW Recovery Care/Counseling Consult Complete Complete -  Palliative Care Screening Not Applicable Not Applicable -  [place] Not Applicable Patient Refused -  Some recent data might be hidden

## 2020-12-19 NOTE — Progress Notes (Addendum)
PROGRESS NOTE   Ann [name]  [id number] DOB: 1946-05-20 DOA: 12/18/2020 PCP: Rosita Fire, MD   No chief complaint on file.  Level of care: Stepdown  Brief Admission History:  75 y.o. female with past medical history of diabetes mellitus, atrial fibrillation, diastolic CHF, COPD, adrenal insufficiency.  She was admitted today under surgical service for colostomy reversal on 12/18/20.  Patient was admitted back in December 2021  ( 19th- 30th) for perforated diverticulitis, she also had septic shock due to Enterococcus and hypovolemic shock from acute blood loss anemia (2/2 bleeding per rectum requiring 3 units of blood ) and secondary to adrenal insufficiency.  During the hospitalization, patient underwent colectomy with colostomy placement 04/2020 by Dr. Constance Haw.  Assessment & Plan:   Principal Problem:   Colostomy in place [facility]) Active Problems:   Acquired hypothyroidism   Mixed hyperlipidemia   Essential hypertension   Uncontrolled type 2 diabetes mellitus with hyperglycemia (HCC)   Chronic kidney disease, stage III (moderate) (HCC)   Atrial fibrillation (HCC)   PAF (paroxysmal atrial fibrillation) (HCC)   Coronary artery disease of native artery of native heart with stable angina pectoris ([place])   History of DVT (deep vein thrombosis)   Debility   Hypoalbuminemia due to protein-calorie malnutrition ([place])   Controlled type 2 diabetes mellitus with hyperglycemia, with long-term current use of insulin (HCC)   Labile blood glucose   Colostomy, retracted ([place])   History of colostomy reversal   Postop day #1 s/p colostomy reversal - Pt is doing well postop. Dr. Constance Haw is following closely.    COPD with chronic respiratory failure - Pt is steroid dependent on prednisone 10 mg daily at home. She received stress dosing of steroid and now being tapered back down to home dose. She is doing well and stable from this standpoint.   Chronic atrial fibrillation - She has been  resumed on home dose diltiazem. Full anticoagulation temporarily held.  Resume per surgeon.   Type 2 diabetes mellitus, controlled - as evidenced by an A1c of 6.9%.  She was restarted on half dose basal levemir and has SSI coverage and prandial coverage.  Follow CBG and adjust therapy as needed.  CBG (last 3)  Recent Labs    12/19/20 0602 12/19/20 0723 12/19/20 1121  GLUCAP 203* 195* 182*   Essential hypertension - BPs are controlled at this time on home medications.  Follow.   Hypomagnesemia - IV replacement given, recheck in AM.   History of adrenal insufficiency - pt received stress dose steroid treatment and is being tapered back down to home dose prednisone 10 mg daily.   CAD - stable, had recent low risk stress test as part of preoperative work up.   Leukocytosis - likely from steroid burst and operation, trending down, recheck in AM.   DVT prophylaxis: SQ heparin Code Status: Full  Family Communication: daughter updated at bedside 7/26 Disposition: anticipating return home with [facility] Status is: Inpatient  Remains inpatient appropriate because:IV treatments appropriate due to intensity of illness or inability to take PO and Inpatient level of care appropriate due to severity of illness  Dispo: The patient is from: Home              Anticipated d/c is to: Home              Patient currently is not medically stable to d/c.   Difficult to place patient No    Consultants:  N/a  Procedures:  Colostomy reversal 7/25  Subjective: Pt reports that she is feeling well this morning, some gas but no BM yet.  She denies CP, palpitation or SOB. Swelling in extremities has gotten much better.    Objective: Vitals:   12/19/20 0800 12/19/20 0849 12/19/20 0900 12/19/20 1122  BP: 92/69  (!) 96/58   Pulse: (!) 103  67 (!) 54  Resp: (!) 21  20 (!) 25  Temp:    98.7 F (37.1 C)  TempSrc:    Oral  SpO2: 94% 96% 98% 95%  Weight:      Height:        Intake/Output Summary (Last  24 hours) at 12/19/2020 1127 Last data filed at 12/19/2020 0751 Gross per 24 hour  Intake 3576.67 ml  Output 495 ml  Net 3081.67 ml   Filed Weights   12/18/20 1533 12/19/20 0123  Weight: 107 kg 108.2 kg    Examination:  General exam: elderly female, awake, alert, NAD, Appears calm and comfortable  Respiratory system: no crackles or rales heard.  No increased work of breathing. Remains on nasal cannula at 2L/min.  Cardiovascular system: irregularly irregular, normal S1 & S2 heard. No JVD, murmurs, rubs, gallops or clicks. trace pedal edema. Gastrointestinal system: Abdomen is soft with mild tenderness. No organomegaly or masses felt. bowel sounds heard. Central nervous system: Alert and oriented. No focal neurological deficits. Extremities: Symmetric 5 x 5 power. Skin: No rashes, lesions or ulcers Psychiatry: Judgement and insight appear normal. Mood & affect appropriate.   Data Reviewed: I have personally reviewed following labs and imaging studies  CBC: Recent Labs  Lab 12/15/20 1043 12/18/20 1825 12/19/20 0340  WBC 8.7 22.5* 15.7*  NEUTROABS 6.1  --  14.2*  HGB 11.3* 11.7* 10.4*  HCT 38.6 40.0 35.9*  MCV 81.8 82.0 82.5  PLT 306 261 [id number]    Basic Metabolic Panel: Recent Labs  Lab 12/15/20 1043 12/18/20 1825 12/19/20 0340  NA 136 133* 134*  K 4.4 4.4 5.0  CL 99 100 100  CO2 '28 25 24  '$ GLUCOSE 157* 236* 209*  BUN '16 16 18  '$ CREATININE 0.76 0.79 0.87  CALCIUM 9.5 8.7* 8.8*  MG  --   --  1.6*  PHOS  --   --  4.7*    GFR: Estimated Creatinine Clearance: 65.9 mL/min (by C-G formula based on SCr of 0.87 mg/dL).  Liver Function Tests: Recent Labs  Lab 12/15/20 1043  AST 24  ALT 16  ALKPHOS 117  BILITOT 0.6  PROT 8.1  ALBUMIN 4.0    CBG: Recent Labs  Lab 12/18/20 1711 12/18/20 2203 12/19/20 0602 12/19/20 0723 12/19/20 1121  GLUCAP 221* 200* 203* 195* 182*    Recent Results (from the past 240 hour(s))  SARS CORONAVIRUS 2 (TAT 6-24 HRS)  Nasopharyngeal Nasopharyngeal Swab     Status: None   Collection Time: 12/15/20 10:22 AM   Specimen: Nasopharyngeal Swab  Result Value Ref Range Status   SARS Coronavirus 2 NEGATIVE NEGATIVE Final    Comment: (NOTE) SARS-CoV-2 target nucleic acids are NOT DETECTED.  The SARS-CoV-2 RNA is generally detectable in upper and lower respiratory specimens during the acute phase of infection. Negative results do not preclude SARS-CoV-2 infection, do not rule out co-infections with other pathogens, and should not be used as the sole basis for treatment or other patient management decisions. Negative results must be combined with clinical observations, patient history, and epidemiological information. The expected result is Negative.  Fact Sheet for Patients: SugarRoll.be  Fact Sheet for  Healthcare Providers: https://www.woods-mathews.com/  This test is not yet approved or cleared by the [place] and  has been authorized for detection and/or diagnosis of SARS-CoV-2 by FDA under an Emergency Use Authorization (EUA). This EUA will remain  in effect (meaning this test can be used) for the duration of the COVID-19 declaration under Se ction 564(b)(1) of the Act, 21 U.S.C. section 360bbb-3(b)(1), unless the authorization is terminated or revoked sooner.  Performed at [facility], [place] [address] [place], [place] [address]   MRSA Next Gen by PCR, Nasal     Status: None   Collection Time: 12/18/20  4:01 PM   Specimen: Nasal Mucosa; Nasal Swab  Result Value Ref Range Status   MRSA by PCR Next Gen NOT DETECTED NOT DETECTED Final    Comment: (NOTE) The GeneXpert MRSA Assay (FDA approved for NASAL specimens only), is one component of a comprehensive MRSA colonization surveillance program. It is not intended to diagnose MRSA infection nor to guide or monitor treatment for MRSA infections. Test performance is not FDA approved in patients  less than 84 years old. Performed at [facility], [address], [place], [place] [address]      Radiology Studies: No results found.  Scheduled Meds:  acetaminophen  1,000 mg Oral Q6H   alvimopan  12 mg Oral BID   Chlorhexidine Gluconate Cloth  6 each Topical Daily   darifenacin  7.5 mg Oral Daily   diltiazem  240 mg Oral Daily   feeding supplement (GLUCERNA SHAKE)  237 mL Oral BID BM   gabapentin  400 mg Oral TID   heparin injection (subcutaneous)  5,000 Units Subcutaneous Q8H   insulin aspart  0-20 Units Subcutaneous TID WC   insulin aspart  0-9 Units Subcutaneous Q6H   insulin aspart  6 Units Subcutaneous TID WC   insulin detemir  25 Units Subcutaneous Q2200   levothyroxine  175 mcg Oral q morning   mirabegron ER  50 mg Oral Daily   pantoprazole  40 mg Oral Daily   [START ON 12/20/2020] predniSONE  20 mg Oral Once   Followed by   Derrill Memo ON 12/21/2020] predniSONE  10 mg Oral Q breakfast   saccharomyces boulardii  250 mg Oral BID   sertraline  50 mg Oral QHS   sodium phosphate  1 enema Rectal Once   umeclidinium bromide  2 puff Inhalation Daily   Continuous Infusions:  lactated ringers 30 mL/hr at 12/19/20 0810     LOS: 1 day   Time spent: 36 mins   Duwan Adrian Wynetta Emery, MD How to contact the [facility] Attending or Consulting provider [place] or covering provider during after hours [place], for this patient?  Check the care team in [facility] and look for a) attending/consulting [facility] provider listed and b) the [facility] team listed Log into [email or web address] and use [place]'s universal password to access. If you do not have the password, please contact the hospital operator. Locate the [facility] provider you are looking for under [facility] and page to a number that you can be directly reached. If you still have difficulty reaching the provider, please page the [facility] (Director on Call) for the Hospitalists listed on amion for assistance.  12/19/2020, 11:27 AM

## 2020-12-20 DIAGNOSIS — Z933 Colostomy status: Secondary | ICD-10-CM

## 2020-12-20 LAB — CBC
HCT: 32.1 % — ABNORMAL LOW (ref 36.0–46.0)
Hemoglobin: 9.5 g/dL — ABNORMAL LOW (ref 12.0–15.0)
MCH: 24 pg — ABNORMAL LOW (ref 26.0–34.0)
MCHC: 29.6 g/dL — ABNORMAL LOW (ref 30.0–36.0)
MCV: 81.1 fL (ref 80.0–100.0)
Platelets: 207 10*3/uL (ref 150–400)
RBC: 3.96 MIL/uL (ref 3.87–5.11)
RDW: 17.8 % — ABNORMAL HIGH (ref 11.5–15.5)
WBC: 14.5 10*3/uL — ABNORMAL HIGH (ref 4.0–10.5)
nRBC: 0 % (ref 0.0–0.2)

## 2020-12-20 LAB — COMPREHENSIVE METABOLIC PANEL
ALT: 12 U/L (ref 0–44)
AST: 14 U/L — ABNORMAL LOW (ref 15–41)
Albumin: 2.9 g/dL — ABNORMAL LOW (ref 3.5–5.0)
Alkaline Phosphatase: 80 U/L (ref 38–126)
Anion gap: 6 (ref 5–15)
BUN: 14 mg/dL (ref 8–23)
CO2: 25 mmol/L (ref 22–32)
Calcium: 8.5 mg/dL — ABNORMAL LOW (ref 8.9–10.3)
Chloride: 99 mmol/L (ref 98–111)
Creatinine, Ser: 0.65 mg/dL (ref 0.44–1.00)
GFR, Estimated: >60 mL/min (ref >60)
Glucose, Bld: 133 mg/dL — ABNORMAL HIGH (ref 70–99)
Potassium: 4.5 mmol/L (ref 3.5–5.1)
Sodium: 130 mmol/L — ABNORMAL LOW (ref 135–145)
Total Bilirubin: 0.5 mg/dL (ref 0.3–1.2)
Total Protein: 6.3 g/dL — ABNORMAL LOW (ref 6.5–8.1)

## 2020-12-20 LAB — GLUCOSE, CAPILLARY
Glucose-Capillary: 145 mg/dL — ABNORMAL HIGH (ref 70–99)
Glucose-Capillary: 126 mg/dL — ABNORMAL HIGH (ref 70–99)
Glucose-Capillary: 139 mg/dL — ABNORMAL HIGH (ref 70–99)
Glucose-Capillary: 128 mg/dL — ABNORMAL HIGH (ref 70–99)
Glucose-Capillary: 147 mg/dL — ABNORMAL HIGH (ref 70–99)

## 2020-12-20 LAB — T4, FREE: Free T4: 1.58 ng/dL — ABNORMAL HIGH (ref 0.61–1.12)

## 2020-12-20 LAB — TSH: TSH: 2.277 u[IU]/mL (ref 0.350–4.500)

## 2020-12-20 LAB — OSMOLALITY: Osmolality: 285 mOsm/kg (ref 275–295)

## 2020-12-20 LAB — MAGNESIUM: Magnesium: 2.1 mg/dL (ref 1.7–2.4)

## 2020-12-20 MED ORDER — SALINE SPRAY 0.65 % NA SOLN: 1.0000 | NASAL | Status: DC | PRN

## 2020-12-20 MED ORDER — OXYMETAZOLINE HCL 0.05 % NA SOLN
1.0000 | Freq: Two times a day (BID) | NASAL | Status: DC
  Administered 2020-12-20 – 2020-12-23 (×4): 1 via NASAL
  Filled 2020-12-20: qty 15
  Filled 2020-12-20: qty 30

## 2020-12-20 MED ORDER — SODIUM CHLORIDE 0.9 % IV BOLUS
500.0000 mL | Freq: Once | INTRAVENOUS | Status: AC
  Administered 2020-12-20: 500 mL via INTRAVENOUS

## 2020-12-20 NOTE — Progress Notes (Signed)
PROGRESS NOTE    Ann [name]  [id number] DOB: January 22, 1946 DOA: 12/18/2020 PCP: Rosita Fire, MD   Brief Narrative:   75 y.o. female with past medical history of diabetes mellitus, atrial fibrillation, diastolic CHF, COPD, adrenal insufficiency.  She was admitted today under surgical service for colostomy reversal on 12/18/20.  Patient was admitted back in December 2021  ( 19th- 30th) for perforated diverticulitis, she also had septic shock due to Enterococcus and hypovolemic shock from acute blood loss anemia (2/2 bleeding per rectum requiring 3 units of blood ) and secondary to adrenal insufficiency.  During the hospitalization, patient underwent colectomy with colostomy placement 04/2020 by Dr. Constance Haw.  Assessment & Plan:   Principal Problem:   Colostomy in place [facility]) Active Problems:   Acquired hypothyroidism   Mixed hyperlipidemia   Essential hypertension   Uncontrolled type 2 diabetes mellitus with hyperglycemia (HCC)   Chronic kidney disease, stage III (moderate) (HCC)   Atrial fibrillation (HCC)   PAF (paroxysmal atrial fibrillation) (HCC)   Coronary artery disease of native artery of native heart with stable angina pectoris ([place])   History of DVT (deep vein thrombosis)   Debility   Hypoalbuminemia due to protein-calorie malnutrition ([place])   Controlled type 2 diabetes mellitus with hyperglycemia, with long-term current use of insulin (HCC)   Labile blood glucose   Colostomy, retracted ([place])   History of colostomy reversal   Postop day #2 s/p colostomy reversal - Pt is doing well postop. Dr. Constance Haw is following closely.     COPD with chronic respiratory failure - Pt is steroid dependent on prednisone 10 mg daily at home. She received stress dosing of steroid and now being tapered back down to home dose. She is doing well and stable from this standpoint.  Mild hyponatremia-continue to monitor.  Likely related to her history of adrenal insufficiency.  With downward  trend, plan to check TSH and urine and serum osmolarity.  She appears euvolemic.   Chronic atrial fibrillation - She has been resumed on home dose diltiazem. Full anticoagulation temporarily held.  Resume per surgeon.   Type 2 diabetes mellitus, controlled - as evidenced by an A1c of 6.9%.  She was restarted on half dose basal levemir and has SSI coverage and prandial coverage.  Follow CBG and adjust therapy as needed.   Essential hypertension - BPs are controlled at this time on home medications.  Follow.   History of adrenal insufficiency - pt received stress dose steroid treatment and is being tapered back down to home dose prednisone 10 mg daily.   CAD - stable, had recent low risk stress test as part of preoperative work up.    Leukocytosis - likely from steroid burst and operation, trending down, recheck in AM.    DVT prophylaxis: Heparin Code Status: Full Family Communication: Family at bedside 7/27 Disposition Plan:  Status is: Inpatient  Remains inpatient appropriate because:IV treatments appropriate due to intensity of illness or inability to take PO and Inpatient level of care appropriate due to severity of illness  Dispo: The patient is from: Home              Anticipated d/c is to: Home              Patient currently is not medically stable to d/c.   Difficult to place patient No   Consultants:  N/A  Procedures:  Colostomy reversal 7/25  Antimicrobials:  Anti-infectives (From admission, onward)    Start  Dose/Rate Route Frequency Ordered Stop   12/18/20 2200  cefoTEtan (CEFOTAN) 2 g in sodium chloride 0.9 % 100 mL IVPB        2 g 200 mL/hr over 30 Minutes Intravenous Every 12 hours 12/18/20 1703 12/19/20 2223   12/18/20 0900  cefoTEtan (CEFOTAN) 2 g in sodium chloride 0.9 % 100 mL IVPB        2 g 200 mL/hr over 30 Minutes Intravenous On call to O.R. 12/18/20 [id number] 12/18/20 0933       Subjective: Patient seen and evaluated today with no new acute  complaints or concerns. No acute concerns or events noted overnight.  Objective: Vitals:   12/20/20 0615 12/20/20 0630 12/20/20 0748 12/20/20 1133  BP: 91/67 115/60    Pulse: (!) 142 60    Resp: 17 18    Temp:   (!) 97.4 F (36.3 C) 98.3 F (36.8 C)  TempSrc:   Oral Oral  SpO2: 97% 99%    Weight:      Height:        Intake/Output Summary (Last 24 hours) at 12/20/2020 1309 Last data filed at 12/20/2020 0600 Gross per 24 hour  Intake 1753.33 ml  Output 1900 ml  Net -146.67 ml   Filed Weights   12/18/20 1533 12/19/20 0123 12/20/20 0500  Weight: 107 kg 108.2 kg 107.3 kg    Examination:  General exam: Appears calm and comfortable  Respiratory system: Clear to auscultation. Respiratory effort normal. Cardiovascular system: S1 & S2 heard, RRR.  Gastrointestinal system: Abdominal binder present with incision clean dry and intact. Central nervous system: Alert and awake Extremities: No edema Skin: No significant lesions noted Psychiatry: Flat affect.    Data Reviewed: I have personally reviewed following labs and imaging studies  CBC: Recent Labs  Lab 12/15/20 1043 12/18/20 1825 12/19/20 0340 12/20/20 0642  WBC 8.7 22.5* 15.7* 14.5*  NEUTROABS 6.1  --  14.2*  --   HGB 11.3* 11.7* 10.4* 9.5*  HCT 38.6 40.0 35.9* 32.1*  MCV 81.8 82.0 82.5 81.1  PLT 306 261 255 [id number]   Basic Metabolic Panel: Recent Labs  Lab 12/15/20 1043 12/18/20 1825 12/19/20 0340 12/20/20 0642  NA 136 133* 134* 130*  K 4.4 4.4 5.0 4.5  CL 99 100 100 99  CO2 '28 25 24 25  '$ GLUCOSE 157* 236* 209* 133*  BUN '16 16 18 14  '$ CREATININE 0.76 0.79 0.87 0.65  CALCIUM 9.5 8.7* 8.8* 8.5*  MG  --   --  1.6* 2.1  PHOS  --   --  4.7*  --    GFR: Estimated Creatinine Clearance: 71.4 mL/min (by C-G formula based on SCr of 0.65 mg/dL). Liver Function Tests: Recent Labs  Lab 12/15/20 1043 12/20/20 0642  AST 24 14*  ALT 16 12  ALKPHOS 117 80  BILITOT 0.6 0.5  PROT 8.1 6.3*  ALBUMIN 4.0 2.9*   No  results for input(s): LIPASE, AMYLASE in the last 168 hours. No results for input(s): AMMONIA in the last 168 hours. Coagulation Profile: No results for input(s): INR, PROTIME in the last 168 hours. Cardiac Enzymes: No results for input(s): CKTOTAL, CKMB, CKMBINDEX, TROPONINI in the last 168 hours. BNP (last 3 results) No results for input(s): PROBNP in the last 8760 hours. HbA1C: No results for input(s): HGBA1C in the last 72 hours. CBG: Recent Labs  Lab 12/19/20 1616 12/19/20 2133 12/20/20 0425 12/20/20 0750 12/20/20 1135  GLUCAP 182* 190* 145* 126* 139*   Lipid Profile: No  results for input(s): CHOL, HDL, LDLCALC, TRIG, CHOLHDL, LDLDIRECT in the last 72 hours. Thyroid Function Tests: No results for input(s): TSH, T4TOTAL, FREET4, T3FREE, THYROIDAB in the last 72 hours. Anemia Panel: No results for input(s): VITAMINB12, FOLATE, FERRITIN, TIBC, IRON, RETICCTPCT in the last 72 hours. Sepsis Labs: No results for input(s): PROCALCITON, LATICACIDVEN in the last 168 hours.  Recent Results (from the past 240 hour(s))  SARS CORONAVIRUS 2 (TAT 6-24 HRS) Nasopharyngeal Nasopharyngeal Swab     Status: None   Collection Time: 12/15/20 10:22 AM   Specimen: Nasopharyngeal Swab  Result Value Ref Range Status   SARS Coronavirus 2 NEGATIVE NEGATIVE Final    Comment: (NOTE) SARS-CoV-2 target nucleic acids are NOT DETECTED.  The SARS-CoV-2 RNA is generally detectable in upper and lower respiratory specimens during the acute phase of infection. Negative results do not preclude SARS-CoV-2 infection, do not rule out co-infections with other pathogens, and should not be used as the sole basis for treatment or other patient management decisions. Negative results must be combined with clinical observations, patient history, and epidemiological information. The expected result is Negative.  Fact Sheet for Patients: SugarRoll.be  Fact Sheet for Healthcare  Providers: https://www.woods-mathews.com/  This test is not yet approved or cleared by the [place] FDA and  has been authorized for detection and/or diagnosis of SARS-CoV-2 by FDA under an Emergency Use Authorization (EUA). This EUA will remain  in effect (meaning this test can be used) for the duration of the COVID-19 declaration under Se ction 564(b)(1) of the Act, 21 U.S.C. section 360bbb-3(b)(1), unless the authorization is terminated or revoked sooner.  Performed at [facility], [place] [address], [place], [place] [address]   MRSA Next Gen by PCR, Nasal     Status: None   Collection Time: 12/18/20  4:01 PM   Specimen: Nasal Mucosa; Nasal Swab  Result Value Ref Range Status   MRSA by PCR Next Gen NOT DETECTED NOT DETECTED Final    Comment: (NOTE) The GeneXpert MRSA Assay (FDA approved for NASAL specimens only), is one component of a comprehensive MRSA colonization surveillance program. It is not intended to diagnose MRSA infection nor to guide or monitor treatment for MRSA infections. Test performance is not FDA approved in patients less than 9 years old. Performed at [facility], [address] [place], [place] [address]          Radiology Studies: No results found.      Scheduled Meds:  acetaminophen  1,000 mg Oral Q6H   alvimopan  12 mg Oral BID   Chlorhexidine Gluconate Cloth  6 each Topical Daily   darifenacin  7.5 mg Oral Daily   diltiazem  240 mg Oral Daily   feeding supplement (GLUCERNA SHAKE)  237 mL Oral BID BM   gabapentin  400 mg Oral TID   heparin injection (subcutaneous)  5,000 Units Subcutaneous Q8H   insulin aspart  0-20 Units Subcutaneous TID WC   insulin aspart  0-9 Units Subcutaneous Q6H   insulin aspart  6 Units Subcutaneous TID WC   insulin detemir  25 Units Subcutaneous Q2200   levothyroxine  175 mcg Oral q morning   mirabegron ER  50 mg Oral Daily   pantoprazole  40 mg Oral Daily   [START ON 12/21/2020]  predniSONE  10 mg Oral Q breakfast   saccharomyces boulardii  250 mg Oral BID   sertraline  50 mg Oral QHS   sodium phosphate  1 enema Rectal Once  umeclidinium bromide  2 puff Inhalation Daily   Continuous Infusions:  lactated ringers 30 mL/hr at 12/19/20 0810     LOS: 2 days    Time spent: 35 minutes    Lamario Mani Darleen Crocker, DO [facility]  If 7PM-7AM, please contact night-coverage [email or web address] 12/20/2020, 1:09 PM

## 2020-12-20 NOTE — Progress Notes (Signed)
[facility] Progress Note  2 Days Post-Op  Subjective: Moving around easily, up and out of bed and having good pain control. Tolerating the clears and having flatus. No nausea. Bolused last night for low BP but question if cuff was working.  UOP good.  Objective: Vital signs in last 24 hours: Temp:  [97.4 F (36.3 C)-98.3 F (36.8 C)] 98.3 F (36.8 C) (07/27 1133) Pulse Rate:  [33-142] 52 (07/27 1310) Resp:  [13-27] 19 (07/27 1310) BP: (84-148)/(38-78) 100/77 (07/27 1300) SpO2:  [84 %-100 %] 100 % (07/27 1310) Weight:  [107.3 kg] 107.3 kg (07/27 0500) Last BM Date: 12/17/20  Intake/Output from previous day: 07/26 0701 - 07/27 0700 In: 1873.3 [P.O.:510; I.V.:863.3; IV Piggyback:500] Out: 1900 [Urine:1900] Intake/Output this shift: Total I/O In: -  Out: 550 [Urine:550]  General appearance: alert, cooperative, and no distress Resp: normal work of breathing GI: soft, nondistended, honeycomb and dressing c/d/I and minimal staining, appropriately tender  Lab Results:  Recent Labs    12/19/20 0340 12/20/20 0642  WBC 15.7* 14.5*  HGB 10.4* 9.5*  HCT 35.9* 32.1*  PLT 255 207   BMET Recent Labs    12/19/20 0340 12/20/20 0642  NA 134* 130*  K 5.0 4.5  CL 100 99  CO2 24 25  GLUCOSE 209* 133*  BUN 18 14  CREATININE 0.87 0.65  CALCIUM 8.8* 8.5*     Anti-infectives: Anti-infectives (From admission, onward)    Start     Dose/Rate Route Frequency Ordered Stop   12/18/20 2200  cefoTEtan (CEFOTAN) 2 g in sodium chloride 0.9 % 100 mL IVPB        2 g 200 mL/hr over 30 Minutes Intravenous Every 12 hours 12/18/20 1703 12/19/20 2223   12/18/20 0900  cefoTEtan (CEFOTAN) 2 g in sodium chloride 0.9 % 100 mL IVPB        2 g 200 mL/hr over 30 Minutes Intravenous On call to O.R. 12/18/20 0853 12/18/20 0933       Assessment/Plan: Ann [name] is a 75 yo s/p colostomy reversal. Doing great. Feeing good.  Tylenol and PRN narcotics for pain IS, OOB PT  HD  doing well, A fib at baseline, home meds Telemetry bed upstairs Full liquid diet Levemir 1/2 dose, SSI, BS 130-190 currently  Awaiting BM, entereg Na low and h/o adrenal insufficiency, prednisone tapered to home dose, Dr. Manuella Ghazi checking TSH and urine SCDs, heparin sq will hold on Eliquis for now H&H drifted, normal post op  UOP good    LOS: 2 days    Virl Cagey 12/20/2020

## 2020-12-20 NOTE — Progress Notes (Signed)
Patient arrived to floor alert and oriented. She was able to transfer from wheelchair to bed with no problems or shortness of breath. Incision intact with honeycomb dressing, abdominal binder reinforced.

## 2020-12-21 DIAGNOSIS — Z933 Colostomy status: Secondary | ICD-10-CM | POA: Diagnosis not present

## 2020-12-21 LAB — CBC
HCT: 31.5 % — ABNORMAL LOW (ref 36.0–46.0)
Hemoglobin: 9.4 g/dL — ABNORMAL LOW (ref 12.0–15.0)
MCH: 24.5 pg — ABNORMAL LOW (ref 26.0–34.0)
MCHC: 29.8 g/dL — ABNORMAL LOW (ref 30.0–36.0)
MCV: 82 fL (ref 80.0–100.0)
Platelets: 248 10*3/uL (ref 150–400)
RBC: 3.84 MIL/uL — ABNORMAL LOW (ref 3.87–5.11)
RDW: 18.3 % — ABNORMAL HIGH (ref 11.5–15.5)
WBC: 12.4 10*3/uL — ABNORMAL HIGH (ref 4.0–10.5)
nRBC: 0 % (ref 0.0–0.2)

## 2020-12-21 LAB — COMPREHENSIVE METABOLIC PANEL
ALT: 11 U/L (ref 0–44)
AST: 13 U/L — ABNORMAL LOW (ref 15–41)
Albumin: 3 g/dL — ABNORMAL LOW (ref 3.5–5.0)
Alkaline Phosphatase: 83 U/L (ref 38–126)
Anion gap: 7 (ref 5–15)
BUN: 9 mg/dL (ref 8–23)
CO2: 29 mmol/L (ref 22–32)
Calcium: 9.1 mg/dL (ref 8.9–10.3)
Chloride: 103 mmol/L (ref 98–111)
Creatinine, Ser: 0.48 mg/dL (ref 0.44–1.00)
GFR, Estimated: >60 mL/min (ref >60)
Glucose, Bld: 83 mg/dL (ref 70–99)
Potassium: 4.4 mmol/L (ref 3.5–5.1)
Sodium: 139 mmol/L (ref 135–145)
Total Bilirubin: 0.5 mg/dL (ref 0.3–1.2)
Total Protein: 6.4 g/dL — ABNORMAL LOW (ref 6.5–8.1)

## 2020-12-21 LAB — GLUCOSE, CAPILLARY
Glucose-Capillary: 91 mg/dL (ref 70–99)
Glucose-Capillary: 88 mg/dL (ref 70–99)
Glucose-Capillary: 123 mg/dL — ABNORMAL HIGH (ref 70–99)
Glucose-Capillary: 168 mg/dL — ABNORMAL HIGH (ref 70–99)
Glucose-Capillary: 100 mg/dL — ABNORMAL HIGH (ref 70–99)

## 2020-12-21 LAB — OSMOLALITY, URINE: Osmolality, Ur: 165 mOsm/kg — ABNORMAL LOW (ref 300–900)

## 2020-12-21 LAB — MAGNESIUM: Magnesium: 1.9 mg/dL (ref 1.7–2.4)

## 2020-12-21 NOTE — Progress Notes (Signed)
PROGRESS NOTE    Ann [name]  [id number] DOB: [birth year] DOA: 12/18/2020 PCP: Rosita Fire, MD   Brief Narrative:   75 y.o. female with past medical history of diabetes mellitus, atrial fibrillation, diastolic CHF, COPD, adrenal insufficiency.  She was admitted today under surgical service for colostomy reversal on 12/18/20.  Patient was admitted back in December 2021  ( 19th- 30th) for perforated diverticulitis, she also had septic shock due to Enterococcus and hypovolemic shock from acute blood loss anemia (2/2 bleeding per rectum requiring 3 units of blood ) and secondary to adrenal insufficiency.  During the hospitalization, patient underwent colectomy with colostomy placement 04/2020 by Dr. Constance Haw.  Assessment & Plan:   Principal Problem:   Colostomy in place [facility]) Active Problems:   Acquired hypothyroidism   Mixed hyperlipidemia   Essential hypertension   Uncontrolled type 2 diabetes mellitus with hyperglycemia (HCC)   Chronic kidney disease, stage III (moderate) (HCC)   Atrial fibrillation (HCC)   PAF (paroxysmal atrial fibrillation) (HCC)   Coronary artery disease of native artery of native heart with stable angina pectoris ([place])   History of DVT (deep vein thrombosis)   Debility   Hypoalbuminemia due to protein-calorie malnutrition ([place])   Controlled type 2 diabetes mellitus with hyperglycemia, with long-term current use of insulin (HCC)   Labile blood glucose   Colostomy, retracted ([place])   History of colostomy reversal   Postop day #3 s/p colostomy reversal - Pt is doing well postop. Dr. Constance Haw is following closely.  No bowel movement noted yet.   COPD with chronic respiratory failure - Pt is steroid dependent on prednisone 10 mg daily at home. She received stress dosing of steroid and now being tapered back down to home dose. She is doing well and stable from this standpoint.   Mild hyponatremia-resolved.  Likely related to her history of adrenal  insufficiency.  Much improved this morning and patient is euvolemic.  She has appropriate response with decreased urine osmolarity compared to serum osmolarity.  TSH appears to be appropriate.  Could continue further monitoring in outpatient setting.   Chronic atrial fibrillation - She has been resumed on home dose diltiazem. Full anticoagulation temporarily held.  Resume per surgeon.  She is noted to have significant elevation of heart rate overnight, but is somewhat bradycardic this a.m., but otherwise asymptomatic.   Type 2 diabetes mellitus, controlled - as evidenced by an A1c of 6.9%.  She was restarted on half dose basal levemir and has SSI coverage and prandial coverage.  Follow CBG and adjust therapy as needed.   Essential hypertension - BPs are controlled at this time on home medications.  Follow.   History of adrenal insufficiency - pt received stress dose steroid treatment and is being tapered back down to home dose prednisone 10 mg daily.   CAD - stable, had recent low risk stress test as part of preoperative work up.    Leukocytosis - likely from steroid burst and operation, trending down, recheck in AM.      DVT prophylaxis: Heparin Code Status: Full Family Communication: Family at bedside 7/28 Disposition Plan:  Status is: Inpatient   Remains inpatient appropriate because:IV treatments appropriate due to intensity of illness or inability to take PO and Inpatient level of care appropriate due to severity of illness   Dispo: The patient is from: Home              Anticipated d/c is to: Home  Patient currently is not medically stable to d/c.              Difficult to place patient No     Consultants:  N/A   Procedures:  Colostomy reversal 7/25  Antimicrobials:  Anti-infectives (From admission, onward)    Start     Dose/Rate Route Frequency Ordered Stop   12/18/20 2200  cefoTEtan (CEFOTAN) 2 g in sodium chloride 0.9 % 100 mL IVPB        2 g 200 mL/hr  over 30 Minutes Intravenous Every 12 hours 12/18/20 1703 12/19/20 2223   12/18/20 0900  cefoTEtan (CEFOTAN) 2 g in sodium chloride 0.9 % 100 mL IVPB        2 g 200 mL/hr over 30 Minutes Intravenous On call to O.R. 12/18/20 [id number] 12/18/20 0933       Subjective: Patient seen and evaluated today with no new acute complaints or concerns. No acute concerns or events noted overnight.  She is tolerating her dietary advancement and is passing flatus.  No bowel movement noted yet.  Objective: Vitals:   12/21/20 0543 12/21/20 0731 12/21/20 1125 12/21/20 1142  BP:  127/86 (!) 128/95   Pulse:  (!) 47 64   Resp:  18 18   Temp:  98 F (36.7 C) 98.8 F (37.1 C)   TempSrc:  Oral Oral   SpO2:  92% 94% 94%  Weight: 106.6 kg     Height:        Intake/Output Summary (Last 24 hours) at 12/21/2020 1150 Last data filed at 12/21/2020 0900 Gross per 24 hour  Intake 270 ml  Output 2250 ml  Net -1980 ml   Filed Weights   12/19/20 0123 12/20/20 0500 12/21/20 0543  Weight: 108.2 kg 107.3 kg 106.6 kg    Examination:  General exam: Appears calm and comfortable  Respiratory system: Clear to auscultation. Respiratory effort normal. Cardiovascular system: S1 & S2 heard, RRR.  Gastrointestinal system: Abdominal binder present with incisions clean dry and intact. Central nervous system: Alert and awake Extremities: No edema Skin: No significant lesions noted Psychiatry: Flat affect.    Data Reviewed: I have personally reviewed following labs and imaging studies  CBC: Recent Labs  Lab 12/15/20 1043 12/18/20 1825 12/19/20 0340 12/20/20 0642 12/21/20 0614  WBC 8.7 22.5* 15.7* 14.5* 12.4*  NEUTROABS 6.1  --  14.2*  --   --   HGB 11.3* 11.7* 10.4* 9.5* 9.4*  HCT 38.6 40.0 35.9* 32.1* 31.5*  MCV 81.8 82.0 82.5 81.1 82.0  PLT 306 261 255 207 [id number]   Basic Metabolic Panel: Recent Labs  Lab 12/15/20 1043 12/18/20 1825 12/19/20 0340 12/20/20 0642 12/21/20 0614  NA 136 133* 134* 130* 139  K  4.4 4.4 5.0 4.5 4.4  CL 99 100 100 99 103  CO2 '28 25 24 25 29  '$ GLUCOSE 157* 236* 209* 133* 83  BUN '16 16 18 14 9  '$ CREATININE 0.76 0.79 0.87 0.65 0.48  CALCIUM 9.5 8.7* 8.8* 8.5* 9.1  MG  --   --  1.6* 2.1 1.9  PHOS  --   --  4.7*  --   --    GFR: Estimated Creatinine Clearance: 71.1 mL/min (by C-G formula based on SCr of 0.48 mg/dL). Liver Function Tests: Recent Labs  Lab 12/15/20 1043 12/20/20 0642 12/21/20 0614  AST 24 14* 13*  ALT '16 12 11  '$ ALKPHOS 117 80 83  BILITOT 0.6 0.5 0.5  PROT 8.1 6.3* 6.4*  ALBUMIN 4.0  2.9* 3.0*   No results for input(s): LIPASE, AMYLASE in the last 168 hours. No results for input(s): AMMONIA in the last 168 hours. Coagulation Profile: No results for input(s): INR, PROTIME in the last 168 hours. Cardiac Enzymes: No results for input(s): CKTOTAL, CKMB, CKMBINDEX, TROPONINI in the last 168 hours. BNP (last 3 results) No results for input(s): PROBNP in the last 8760 hours. HbA1C: No results for input(s): HGBA1C in the last 72 hours. CBG: Recent Labs  Lab 12/20/20 1637 12/20/20 2052 12/21/20 0256 12/21/20 0719 12/21/20 1125  GLUCAP 128* 147* 91 88 123*   Lipid Profile: No results for input(s): CHOL, HDL, LDLCALC, TRIG, CHOLHDL, LDLDIRECT in the last 72 hours. Thyroid Function Tests: Recent Labs    12/20/20 0642  TSH 2.277  FREET4 1.58*   Anemia Panel: No results for input(s): VITAMINB12, FOLATE, FERRITIN, TIBC, IRON, RETICCTPCT in the last 72 hours. Sepsis Labs: No results for input(s): PROCALCITON, LATICACIDVEN in the last 168 hours.  Recent Results (from the past 240 hour(s))  SARS CORONAVIRUS 2 (TAT 6-24 HRS) Nasopharyngeal Nasopharyngeal Swab     Status: None   Collection Time: 12/15/20 10:22 AM   Specimen: Nasopharyngeal Swab  Result Value Ref Range Status   SARS Coronavirus 2 NEGATIVE NEGATIVE Final    Comment: (NOTE) SARS-CoV-2 target nucleic acids are NOT DETECTED.  The SARS-CoV-2 RNA is generally detectable in upper  and lower respiratory specimens during the acute phase of infection. Negative results do not preclude SARS-CoV-2 infection, do not rule out co-infections with other pathogens, and should not be used as the sole basis for treatment or other patient management decisions. Negative results must be combined with clinical observations, patient history, and epidemiological information. The expected result is Negative.  Fact Sheet for Patients: SugarRoll.be  Fact Sheet for Healthcare Providers: https://www.woods-mathews.com/  This test is not yet approved or cleared by the [place] FDA and  has been authorized for detection and/or diagnosis of SARS-CoV-2 by FDA under an Emergency Use Authorization (EUA). This EUA will remain  in effect (meaning this test can be used) for the duration of the COVID-19 declaration under Se ction 564(b)(1) of the Act, 21 U.S.C. section 360bbb-3(b)(1), unless the authorization is terminated or revoked sooner.  Performed at [facility], [place] [address] [place], [place] [address]   MRSA Next Gen by PCR, Nasal     Status: None   Collection Time: 12/18/20  4:01 PM   Specimen: Nasal Mucosa; Nasal Swab  Result Value Ref Range Status   MRSA by PCR Next Gen NOT DETECTED NOT DETECTED Final    Comment: (NOTE) The GeneXpert MRSA Assay (FDA approved for NASAL specimens only), is one component of a comprehensive MRSA colonization surveillance program. It is not intended to diagnose MRSA infection nor to guide or monitor treatment for MRSA infections. Test performance is not FDA approved in patients less than 92 years old. Performed at [facility], [address], [place], [place] [address]          Radiology Studies: No results found.      Scheduled Meds:  acetaminophen  1,000 mg Oral Q6H   alvimopan  12 mg Oral BID   Chlorhexidine Gluconate Cloth  6 each Topical Daily   darifenacin  7.5 mg Oral Daily    diltiazem  240 mg Oral Daily   feeding supplement (GLUCERNA SHAKE)  237 mL Oral BID BM   gabapentin  400 mg Oral TID   heparin injection (subcutaneous)  5,000  Units Subcutaneous Q8H   insulin aspart  0-20 Units Subcutaneous TID WC   insulin aspart  0-9 Units Subcutaneous Q6H   insulin aspart  6 Units Subcutaneous TID WC   insulin detemir  25 Units Subcutaneous Q2200   levothyroxine  175 mcg Oral q morning   mirabegron ER  50 mg Oral Daily   oxymetazoline  1 spray Each Nare BID   pantoprazole  40 mg Oral Daily   predniSONE  10 mg Oral Q breakfast   saccharomyces boulardii  250 mg Oral BID   sertraline  50 mg Oral QHS   sodium phosphate  1 enema Rectal Once   umeclidinium bromide  2 puff Inhalation Daily     LOS: 3 days    Time spent: 35 minutes    Ann Fitzsimmons Darleen Crocker, DO [facility]  If 7PM-7AM, please contact night-coverage [email or web address] 12/21/2020, 11:50 AM

## 2020-12-21 NOTE — Progress Notes (Signed)
[facility] Progress Note  3 Days Post-Op  Subjective: Up in chair. Having flatus and no nausea. Wants more food. Tolerated full liquids.   Objective: Vital signs in last 24 hours: Temp:  [98 F (36.7 C)-98.8 F (37.1 C)] 98.2 F (36.8 C) (07/28 1309) Pulse Rate:  [47-143] 58 (07/28 1309) Resp:  [16-28] 18 (07/28 1309) BP: (126-150)/(71-95) 126/71 (07/28 1309) SpO2:  [82 %-98 %] 96 % (07/28 1309) Weight:  [106.6 kg] 106.6 kg (07/28 0543) Last BM Date: 12/17/20  Intake/Output from previous day: 07/27 0701 - 07/28 0700 In: 150 [P.O.:150] Out: 2600 [Urine:2600] Intake/Output this shift: Total I/O In: 120 [P.O.:120] Out: 1550 [Urine:1550]  General appearance: alert, cooperative, and no distress Resp: normal work of breathing on O2 GI: soft, nondistended, dressings removed and staples c/d/I without erythema or drainage   Lab Results:  Recent Labs    12/20/20 0642 12/21/20 0614  WBC 14.5* 12.4*  HGB 9.5* 9.4*  HCT 32.1* 31.5*  PLT 207 248   BMET Recent Labs    12/20/20 0642 12/21/20 0614  NA 130* 139  K 4.5 4.4  CL 99 103  CO2 25 29  GLUCOSE 133* 83  BUN 14 9  CREATININE 0.65 0.48  CALCIUM 8.5* 9.1    Anti-infectives: Anti-infectives (From admission, onward)    Start     Dose/Rate Route Frequency Ordered Stop   12/18/20 2200  cefoTEtan (CEFOTAN) 2 g in sodium chloride 0.9 % 100 mL IVPB        2 g 200 mL/hr over 30 Minutes Intravenous Every 12 hours 12/18/20 1703 12/19/20 2223   12/18/20 0900  cefoTEtan (CEFOTAN) 2 g in sodium chloride 0.9 % 100 mL IVPB        2 g 200 mL/hr over 30 Minutes Intravenous On call to O.R. 12/18/20 0853 12/18/20 0933       Assessment/Plan: Ann [name] is a 75 yo s/p colostomy reversal. Doing great. Feeing good. Tylenol and PRN narcotics for pain IS, OOB PT working with her  HD doing well, A fib at baseline, home meds Soft diet  Levemir 1/2 dose, SSI, BS 90-168 currently Awaiting BM, entereg Na  improved, off fluids, monitor SCDs, heparin sq will hold on Eliquis for now H&H stabilizing  UOP good    LOS: 3 days    Virl Cagey 12/21/2020

## 2020-12-21 NOTE — Care Management Important Message (Signed)
Important Message  Patient Details  Name: Ann [name] MRN: [id number] Date of Birth: 1946/02/25   Medicare Important Message Given:  Yes     Tommy Medal 12/21/2020, 11:43 AM

## 2020-12-21 NOTE — Progress Notes (Signed)
Patient's abd and back washed, itching to skin relieved per patient. Incision to midline cleansed and 2 abd pads and paper tape applied. Patient tolerated well. Patient also had a large non formed stool. Reports feeling better. Over all patient reports today has been a "good day".

## 2020-12-22 LAB — COMPREHENSIVE METABOLIC PANEL
ALT: 13 U/L (ref 0–44)
AST: 18 U/L (ref 15–41)
Albumin: 3.1 g/dL — ABNORMAL LOW (ref 3.5–5.0)
Alkaline Phosphatase: 93 U/L (ref 38–126)
Anion gap: 8 (ref 5–15)
BUN: 8 mg/dL (ref 8–23)
CO2: 30 mmol/L (ref 22–32)
Calcium: 9.2 mg/dL (ref 8.9–10.3)
Chloride: 101 mmol/L (ref 98–111)
Creatinine, Ser: 0.57 mg/dL (ref 0.44–1.00)
GFR, Estimated: >60 mL/min (ref >60)
Glucose, Bld: 103 mg/dL — ABNORMAL HIGH (ref 70–99)
Potassium: 4.1 mmol/L (ref 3.5–5.1)
Sodium: 139 mmol/L (ref 135–145)
Total Bilirubin: 0.5 mg/dL (ref 0.3–1.2)
Total Protein: 6.9 g/dL (ref 6.5–8.1)

## 2020-12-22 LAB — CBC
HCT: 36.7 % (ref 36.0–46.0)
Hemoglobin: 10.8 g/dL — ABNORMAL LOW (ref 12.0–15.0)
MCH: 24.2 pg — ABNORMAL LOW (ref 26.0–34.0)
MCHC: 29.4 g/dL — ABNORMAL LOW (ref 30.0–36.0)
MCV: 82.1 fL (ref 80.0–100.0)
Platelets: 320 10*3/uL (ref 150–400)
RBC: 4.47 MIL/uL (ref 3.87–5.11)
RDW: 18.6 % — ABNORMAL HIGH (ref 11.5–15.5)
WBC: 9.6 10*3/uL (ref 4.0–10.5)
nRBC: 0 % (ref 0.0–0.2)

## 2020-12-22 LAB — GLUCOSE, CAPILLARY
Glucose-Capillary: 74 mg/dL (ref 70–99)
Glucose-Capillary: 108 mg/dL — ABNORMAL HIGH (ref 70–99)
Glucose-Capillary: 223 mg/dL — ABNORMAL HIGH (ref 70–99)
Glucose-Capillary: 133 mg/dL — ABNORMAL HIGH (ref 70–99)
Glucose-Capillary: 120 mg/dL — ABNORMAL HIGH (ref 70–99)

## 2020-12-22 MED ORDER — INSULIN DETEMIR 100 UNIT/ML ~~LOC~~ SOLN
40.0000 [IU] | Freq: Every day | SUBCUTANEOUS | Status: DC
  Administered 2020-12-22: 40 [IU] via SUBCUTANEOUS
  Filled 2020-12-22 (×3): qty 0.4

## 2020-12-22 MED ORDER — APIXABAN 5 MG PO TABS
5.0000 mg | ORAL_TABLET | Freq: Two times a day (BID) | ORAL | Status: DC
  Administered 2020-12-22 – 2020-12-23 (×2): 5 mg via ORAL
  Filled 2020-12-22 (×2): qty 1

## 2020-12-22 NOTE — Progress Notes (Signed)
Patient had scant amount of drainage to ABD pad from lower part of midline incision. Tolerating her diet. Her bowels have moved 3 times this morning.

## 2020-12-22 NOTE — Progress Notes (Signed)
Physical Therapy Treatment Patient Details Name: Ann [name] MRN: [id number] DOB: 1945/09/20 Today's Date: 12/22/2020    History of Present Illness Ann [name] is a 75 y/o female, s/p Colostomy reversal with left colectomy on 12/18/20, with the diagnosis of Colostomy in place.    PT Comments    Patient demonstrates increased endurance/distance for ambulation in hallway with slightly labored slow cadence, no loss of balance and limited mostly due to fatigue and SOB with SpO2 between 87-92%.  Patient tolerated sitting up in chair with SpO2 increasing to 95% after therapy.  Patient will benefit from continued physical therapy in hospital and recommended venue below to increase strength, balance, endurance for safe ADLs and gait.     Follow Up Recommendations  Home health PT;Supervision for mobility/OOB;Supervision - Intermittent     Equipment Recommendations  None recommended by PT    Recommendations for Other Services       Precautions / Restrictions Precautions Precautions: Fall Precaution Comments: Home O2 dependent 2 LPM Restrictions Weight Bearing Restrictions: No    Mobility  Bed Mobility Overal bed mobility: Modified Independent             General bed mobility comments: Patient demonstrates good return for sitting up at bedside with [facility] raised approximately 30 degrees    Transfers Overall transfer level: Needs assistance Equipment used: Rolling walker (2 wheeled) Transfers: Sit to/from [facility] Sit to Stand: Min guard Stand pivot transfers: Supervision       General transfer comment: requires verbal cues for proper hand placement during sit to stands with good return demonstrated  Ambulation/Gait Ambulation/Gait assistance: Supervision Gait Distance (Feet): 65 Feet Assistive device: Rolling walker (2 wheeled) Gait Pattern/deviations: Decreased step length - right;Decreased step length - left;Decreased stride length;Trunk  flexed Gait velocity: decreased   General Gait Details: increased endurance/distance for ambulation with slow labored cadence without loss of balance, limited mostly due to c/o fatigue and SOB while on 2 LPM with SpO2 at 88-92%   Stairs             Wheelchair Mobility    Modified Rankin (Stroke Patients Only)       Balance Overall balance assessment: Needs assistance Sitting-balance support: Feet supported;No upper extremity supported Sitting balance-Leahy Scale: Good Sitting balance - Comments: seated at EOB   Standing balance support: During functional activity;Bilateral upper extremity supported Standing balance-Leahy Scale: Fair Standing balance comment: fair/good using RW                            Cognition Arousal/Alertness: Awake/alert Behavior During Therapy: WFL for tasks assessed/performed Overall Cognitive Status: Within Functional Limits for tasks assessed                                        Exercises General Exercises - Lower Extremity Long Arc Quad: Seated;AROM;Strengthening;Both;10 reps Hip Flexion/Marching: Seated;AROM;Strengthening;Both;10 reps Toe Raises: Seated;AROM;Strengthening;Both;10 reps Heel Raises: Seated;AROM;Strengthening;Both;10 reps    General Comments        Pertinent Vitals/Pain Pain Assessment: No/denies pain    Home Living                      Prior Function            PT Goals (current goals can now be found in the care plan section) Acute Rehab PT Goals  Patient Stated Goal: return home with family to assist PT Goal Formulation: With patient/family Time For Goal Achievement: 12/26/20 Potential to Achieve Goals: Good Progress towards PT goals: Progressing toward goals    Frequency    Min 3X/week      PT Plan Current plan remains appropriate    Co-evaluation              AM-PAC PT "6 Clicks" Mobility   Outcome Measure  Help needed turning from your back to  your side while in a flat bed without using bedrails?: None Help needed moving from lying on your back to sitting on the side of a flat bed without using bedrails?: None Help needed moving to and from a bed to a chair (including a wheelchair)?: A Little Help needed standing up from a chair using your arms (e.g., wheelchair or bedside chair)?: A Little Help needed to walk in hospital room?: A Little Help needed climbing 3-5 steps with a railing? : A Lot 6 Click Score: 19    End of Session Equipment Utilized During Treatment: Oxygen Activity Tolerance: Patient tolerated treatment well;Patient limited by fatigue Patient left: in chair;with call bell/phone within reach Nurse Communication: Mobility status PT Visit Diagnosis: Unsteadiness on feet (R26.81);Other abnormalities of gait and mobility (R26.89);Muscle weakness (generalized) (M62.81)     Time: 1027-1100 PT Time Calculation (min) (ACUTE ONLY): 33 min  Charges:  $Gait Training: 8-22 mins $Therapeutic Exercise: 8-22 mins                     2:26 PM, 12/22/20 Lonell Grandchild, MPT Physical Therapist with [facility] [phone] office [phone] mobile phone

## 2020-12-22 NOTE — Care Management Important Message (Signed)
Important Message  Patient Details  Name: Ann [name] MRN: [id number] Date of Birth: April 09, 1946   Medicare Important Message Given:  Yes     Tommy Medal 12/22/2020, 10:53 AM

## 2020-12-22 NOTE — Progress Notes (Signed)
[facility] Progress Note  4 Days Post-Op  Subjective: 4 Bms doing well and eating.   Objective: Vital signs in last 24 hours: Temp:  [97.7 F (36.5 C)-98.6 F (37 C)] 98.6 F (37 C) (07/29 1315) Pulse Rate:  [51-84] 51 (07/29 1315) Resp:  [16-20] 16 (07/29 1315) BP: (120-152)/(92-100) 120/92 (07/29 1315) SpO2:  [94 %-100 %] 100 % (07/29 1315) Weight:  [101 kg] 101 kg (07/29 0500) Last BM Date: 12/21/20  Intake/Output from previous day: 07/28 0701 - 07/29 0700 In: 120 [P.O.:120] Out: 3750 [Urine:3750] Intake/Output this shift: Total I/O In: 600 [P.O.:600] Out: 500 [Urine:500]  General appearance: alert and no distress Resp: normal work of breathing, O2 home GI: soft, nondistended ,appropriately tender, staples with minor drainage at midline, ostomy site clean, no erythema from either   Lab Results:  Recent Labs    12/21/20 0614 12/22/20 0722  WBC 12.4* 9.6  HGB 9.4* 10.8*  HCT 31.5* 36.7  PLT 248 320   BMET Recent Labs    12/21/20 0614 12/22/20 0722  NA 139 139  K 4.4 4.1  CL 103 101  CO2 29 30  GLUCOSE 83 103*  BUN 9 8  CREATININE 0.48 0.57  CALCIUM 9.1 9.2   PT/INR No results for input(s): LABPROT, INR in the last 72 hours.  Studies/Results: No results found.  Anti-infectives: Anti-infectives (From admission, onward)    Start     Dose/Rate Route Frequency Ordered Stop   12/18/20 2200  cefoTEtan (CEFOTAN) 2 g in sodium chloride 0.9 % 100 mL IVPB        2 g 200 mL/hr over 30 Minutes Intravenous Every 12 hours 12/18/20 1703 12/19/20 2223   12/18/20 0900  cefoTEtan (CEFOTAN) 2 g in sodium chloride 0.9 % 100 mL IVPB        2 g 200 mL/hr over 30 Minutes Intravenous On call to O.R. 12/18/20 [id number] 12/18/20 0933       Assessment/Plan: Ann [name] is a 75 yo s/p colostomy reversal doing well. PRN For pain IS, OOB HD ok Eliquis restarted for A fib Soft diet Levemir increased to 40 from 25 ABD to midline  CBC in AM SCDs,  eliquis  Home tomorrow with PT and [facility] RN  LOS: 4 days    Virl Cagey 12/22/2020

## 2020-12-23 LAB — CBC WITH DIFFERENTIAL/PLATELET
Abs Immature Granulocytes: 0.04 10*3/uL (ref 0.00–0.07)
Basophils Absolute: 0.1 10*3/uL (ref 0.0–0.1)
Basophils Relative: 1 %
Eosinophils Absolute: 0.5 10*3/uL (ref 0.0–0.5)
Eosinophils Relative: 5 %
HCT: 38.4 % (ref 36.0–46.0)
Hemoglobin: 11.1 g/dL — ABNORMAL LOW (ref 12.0–15.0)
Immature Granulocytes: 0 %
Lymphocytes Relative: 27 %
Lymphs Abs: 2.7 10*3/uL (ref 0.7–4.0)
MCH: 23.9 pg — ABNORMAL LOW (ref 26.0–34.0)
MCHC: 28.9 g/dL — ABNORMAL LOW (ref 30.0–36.0)
MCV: 82.8 fL (ref 80.0–100.0)
Monocytes Absolute: 0.8 10*3/uL (ref 0.1–1.0)
Monocytes Relative: 8 %
Neutro Abs: 6.2 10*3/uL (ref 1.7–7.7)
Neutrophils Relative %: 59 %
Platelets: 312 10*3/uL (ref 150–400)
RBC: 4.64 MIL/uL (ref 3.87–5.11)
RDW: 18.2 % — ABNORMAL HIGH (ref 11.5–15.5)
WBC: 10.3 10*3/uL (ref 4.0–10.5)
nRBC: 0 % (ref 0.0–0.2)

## 2020-12-23 LAB — GLUCOSE, CAPILLARY
Glucose-Capillary: 124 mg/dL — ABNORMAL HIGH (ref 70–99)
Glucose-Capillary: 120 mg/dL — ABNORMAL HIGH (ref 70–99)
Glucose-Capillary: 134 mg/dL — ABNORMAL HIGH (ref 70–99)
Glucose-Capillary: 166 mg/dL — ABNORMAL HIGH (ref 70–99)

## 2020-12-23 MED ORDER — ONDANSETRON HCL 4 MG PO TABS: 4.0000 mg | ORAL_TABLET | Freq: Four times a day (QID) | ORAL | 0 refills | Status: DC | PRN

## 2020-12-23 MED ORDER — OXYCODONE HCL 5 MG PO TABS: 5.0000 mg | ORAL_TABLET | ORAL | 0 refills | Status: DC | PRN

## 2020-12-23 NOTE — TOC Transition Note (Signed)
Transition of Care [facility]) - CM/SW Discharge Note   Patient Details  Name: Ann [name] MRN: [id number] Date of Birth: [birth year]  [facility] CM/SW Contact:  Natasha Bence, LCSW Phone Number: 12/23/2020, 12:03 PM   Clinical Narrative:    CSW notified of patient's readiness for discharge. CSW notified Corene Cornea with [facility] of patient's discharge. Corene Cornea agreeable to provide services. TOC signing off.    Final next level of care: [place] Barriers to Discharge: Barriers Resolved   Patient Goals and CMS Choice Patient states their goals for this hospitalization and ongoing recovery are:: return home with [facility] CMS Medicare.gov Compare Post Acute Care list provided to:: Patient Choice offered to / list presented to : Patient  Discharge Placement                    Patient and family notified of of transfer: 12/23/20  Discharge Plan and Services In-house Referral: Clinical Social Work Discharge Planning Services: CM Consult Post Acute Care Choice: Home Health                    HH Arranged: RN, PT [facility] Agency: [facility] ([facility]) Date HH Agency Contacted: 12/23/20 Time [place]: 1203 Representative spoke with at [place]: [place] ([place]) Interventions     Readmission Risk Interventions Readmission Risk Prevention Plan 12/19/2020 05/16/2020 01/24/2020  Transportation Screening Complete Complete Complete  HRI or Home Care Consult - - Complete  Social Work Consult for [place] Planning/Counseling - - Complete  Palliative Care Screening - - Not Applicable  Medication Review [employer] photographer) Complete Complete Complete  HRI or Home Care Consult Complete Complete -  SW Recovery Care/Counseling Consult Complete Complete -  Palliative Care Screening Not Applicable Not Applicable -  [place] Not Applicable Patient Refused -  Some recent data might be hidden

## 2020-12-23 NOTE — Discharge Summary (Signed)
Physician Discharge Summary  Patient ID: Ann [name] MRN: [id number] DOB/AGE: [birth year] 75 y.o.  Admit date: 12/18/2020 Discharge date: 12/23/2020  Admission Diagnoses: Colostomy in place   Discharge Diagnoses:  Principal Problem:   Colostomy in place [facility]) Active Problems:   Acquired hypothyroidism   Mixed hyperlipidemia   Essential hypertension   Uncontrolled type 2 diabetes mellitus with hyperglycemia (HCC)   Chronic kidney disease, stage III (moderate) (HCC)   Atrial fibrillation (HCC)   PAF (paroxysmal atrial fibrillation) (HCC)   Coronary artery disease of native artery of native heart with stable angina pectoris ([place])   History of DVT (deep vein thrombosis)   Debility   Hypoalbuminemia due to protein-calorie malnutrition ([place])   Controlled type 2 diabetes mellitus with hyperglycemia, with long-term current use of insulin (HCC)   Labile blood glucose   Colostomy, retracted ([place])   History of colostomy reversal   Discharged Condition: good  Hospital Course: Ann [name] is a 75 yo s/p end colostomy for perforated diverticulitis who was brought back for reversal. She has done well since her reversal and is tolerating a diet, having good Bms and having adequate pain control with oral pain meds. She is back to her home O2 requirements and home prednisone requirements. She was making good UOP and ambulate with assistance. She will have [place] [facility] PT and RN to help her at home with her husband and son.  She had some minor drainage from the midline but no erythema or drainage.  Her Eliquis was restarted and H&H stable prior to discharge.   Consults:  Hospitalist, PT   Significant Diagnostic Studies:  Results for Ann [name] (MRN [id number]) as of 12/23/2020 13:16  Ref. Range 12/23/2020 05:20  WBC Latest Ref Range: 4.0 - 10.5 K/uL 10.3  RBC Latest Ref Range: 3.87 - 5.11 MIL/uL 4.64  Hemoglobin Latest Ref Range: 12.0 - 15.0 g/dL 11.1 (L)  HCT Latest Ref Range: 36.0 - 46.0 %  38.4  MCV Latest Ref Range: 80.0 - 100.0 fL 82.8  MCH Latest Ref Range: 26.0 - 34.0 pg 23.9 (L)  MCHC Latest Ref Range: 30.0 - 36.0 g/dL 28.9 (L)  RDW Latest Ref Range: 11.5 - 15.5 % 18.2 (H)  Platelets Latest Ref Range: 150 - 400 K/uL 312  nRBC Latest Ref Range: 0.0 - 0.2 % 0.0  Neutrophils Latest Units: % 59  Lymphocytes Latest Units: % 27  Monocytes Relative Latest Units: % 8  Eosinophil Latest Units: % 5  Basophil Latest Units: % 1  Immature Granulocytes Latest Units: % 0  NEUT# Latest Ref Range: 1.7 - 7.7 K/uL 6.2  Lymphocyte # Latest Ref Range: 0.7 - 4.0 K/uL 2.7  Monocyte # Latest Ref Range: 0.1 - 1.0 K/uL 0.8  Eosinophils Absolute Latest Ref Range: 0.0 - 0.5 K/uL 0.5  Basophils Absolute Latest Ref Range: 0.0 - 0.1 K/uL 0.1  Abs Immature Granulocytes Latest Ref Range: 0.00 - 0.07 K/uL 0.04   Treatments: IV hydration and 12/18/2020 Colostomy reversal   Pathology: FINAL MICROSCOPIC DIAGNOSIS:   A. COLON, DESCENDING, RESECTION:  - Benign colon.  - No dysplasia or malignancy.   B. LYMPH NODE, MESENTERIC, BIOPSY:  - Nodule of fat necrosis.  - No lymphoid tissue.   Discharge Exam: Blood pressure (!) 135/91, pulse 96, temperature 98.2 F (36.8 C), temperature source Oral, resp. rate 18, height '5\' 3"'$  (1.6 m), weight 100.5 kg, SpO2 97 %. General appearance: alert, cooperative, and no distress Resp: normal work of breathing, home O2 GI: soft,  nondistended, appropriately tender , staples lines without erythema, tiny SS drainage from just inferior to umbilicus where staples are looser  Disposition: Discharge disposition: 01-Home or Self Care      Discharge Instructions     Call MD for:  difficulty breathing, headache or visual disturbances   Complete by: As directed    Call MD for:  extreme fatigue   Complete by: As directed    Call MD for:  persistant dizziness or light-headedness   Complete by: As directed    Call MD for:  persistant nausea and vomiting   Complete  by: As directed    Call MD for:  redness, tenderness, or signs of infection (pain, swelling, redness, odor or green/yellow discharge around incision site)   Complete by: As directed    Call MD for:  severe uncontrolled pain   Complete by: As directed    Call MD for:  temperature >100.4   Complete by: As directed    Increase activity slowly   Complete by: As directed       Allergies as of 12/23/2020       Reactions   Ace Inhibitors Other (See Comments)   Reaction unknown   Asa [aspirin] Other (See Comments)   Causes bleeding   Tape Other (See Comments)   Skin tearing, causes scars   Niacin Rash   Reglan [metoclopramide] Anxiety        Medication List     TAKE these medications    acetaminophen 325 MG tablet Commonly known as: TYLENOL Take 2 tablets (650 mg total) by mouth every 6 (six) hours as needed for mild pain, moderate pain or fever.   albuterol 108 (90 Base) MCG/ACT inhaler Commonly known as: VENTOLIN HFA Inhale 2 puffs into the lungs every 4 (four) hours as needed for wheezing.   apixaban 5 MG Tabs tablet Commonly known as: ELIQUIS Take 1 tablet (5 mg total) by mouth 2 (two) times daily.   atorvastatin 80 MG tablet Commonly known as: LIPITOR Take 1 tablet (80 mg total) by mouth daily at 6 PM.   Carboxymethylcellulose Sod PF 0.5 % Soln Place 2 drops into both eyes daily as needed (for dry eye relief).   cyanocobalamin 1000 MCG tablet Take 1 tablet (1,000 mcg total) by mouth daily.   diltiazem 240 MG 24 hr capsule Commonly known as: CARDIZEM CD Take 1 capsule (240 mg total) by mouth daily.   Docusate Sodium 100 MG capsule Take 100 mg by mouth daily.   esomeprazole 40 MG capsule Commonly known as: NexIUM Take 1 capsule (40 mg total) by mouth 2 (two) times daily before a meal.   furosemide 40 MG tablet Commonly known as: Lasix Take 1 tablet (40 mg total) by mouth 2 (two) times daily.   gabapentin 400 MG capsule Commonly known as:  NEURONTIN Take 1 capsule (400 mg total) by mouth 3 (three) times daily.   ipratropium-albuterol 0.5-2.5 (3) MG/3ML Soln Commonly known as: DUONEB Take 3 mLs by nebulization every 6 (six) hours as needed (copd).   levothyroxine 175 MCG tablet Commonly known as: SYNTHROID Take 1 tablet (175 mcg total) by mouth every morning.   mirabegron ER 50 MG Tb24 tablet Commonly known as: MYRBETRIQ Take 1 tablet (50 mg total) by mouth daily.   nitroGLYCERIN 0.4 MG SL tablet Commonly known as: NITROSTAT Place 1 tablet (0.4 mg total) under the tongue every 5 (five) minutes as needed for chest pain.   ondansetron 4 MG tablet Commonly known as:  ZOFRAN Take 1 tablet (4 mg total) by mouth every 6 (six) hours as needed for nausea.   oxyCODONE 5 MG immediate release tablet Commonly known as: Oxy IR/ROXICODONE Take 1 tablet (5 mg total) by mouth every 4 (four) hours as needed for severe pain or breakthrough pain. What changed: when to take this   predniSONE 10 MG tablet Commonly known as: DELTASONE Take 1 tablet (10 mg total) by mouth daily with breakfast.   Salonpas Pain Relief Patch Ptch Apply 1 patch topically daily as needed (pain).   sertraline 100 MG tablet Commonly known as: ZOLOFT Take 0.5 tablets (50 mg total) by mouth at bedtime.   solifenacin 10 MG tablet Commonly known as: VESICARE Take 10 mg by mouth at bedtime.   Spiriva Respimat 2.5 MCG/ACT Aers Generic drug: Tiotropium Bromide Monohydrate Inhale 2 puffs into the lungs daily.   traZODone 50 MG tablet Commonly known as: DESYREL Take 1 tablet (50 mg total) by mouth at bedtime as needed for sleep.   Vitamin D3 125 MCG (5000 UT) Caps Take 1 capsule (5,000 Units total) by mouth daily.       ASK your doctor about these medications    Levemir FlexTouch 100 UNIT/ML FlexPen Generic drug: insulin detemir Inject 50 Units into the skin daily at 10 pm.        Follow-up Information     Aviva Signs, MD Follow up on  12/28/2020.   Specialty: General Surgery Why: staple removal Contact information: [address] [place] [id number] [phone]         Virl Cagey, MD Follow up on 01/16/2021.   Specialty: General Surgery Why: follow up check Contact information: [address] [place] [facility] [address] [phone]         [facility] Follow up.   Why: PT and RN Contact information: [address] [place]                Signed: Virl Cagey 12/23/2020, 1:18 PM

## 2020-12-23 NOTE — Discharge Instructions (Signed)
Discharge Open Abdominal Surgery Instructions:  Common Complaints: Pain at the incision site is common. This will improve with time. Take your pain medications as described below. Some nausea is common and poor appetite. The main goal is to stay hydrated the first few days after surgery.   Diet/ Activity: Diet as tolerated. You have started and tolerated a diet in the hospital, and should continue to increase what you are able to eat.   You may not have a large appetite, but it is important to stay hydrated. Drink 64 ounces of water a day. Your appetite will return with time.  Keep a dry dressing in place over your staples daily or as needed. Some minor pink/ blood tinged drainage is expected. This will stop in a few days after surgery.  Shower per your regular routine daily.  Do not take hot showers. Take warm showers that are less than 10 minutes. Path the incision dry. Wear an abdominal binder daily with activity. You do not have to wear this while sleeping or sitting.  Rest and listen to your body, but do not remain in bed all day.  Walk everyday for at least 15-20 minutes. Deep cough and move around every 1-2 hours in the first few days after surgery.  Do not lift > 10 lbs, perform excessive bending, pushing, pulling, squatting for 6-8 weeks after surgery.  The activity restrictions and the abdominal binder are to prevent hernia formation at your incision while you are healing.  Do not place lotions or balms on your incision unless instructed to specifically by Dr. Coby Shrewsberry.   Pain Expectations and Narcotics: -After surgery you will have pain associated with your incisions and this is normal. The pain is muscular and nerve pain, and will get better with time. -You are encouraged and expected to take non narcotic medications like tylenol and ibuprofen (when able) to treat pain as multiple modalities can aid with pain treatment. -Narcotics are only used when pain is severe or there is  breakthrough pain. -You are not expected to have a pain score of 0 after surgery, as we cannot prevent pain. A pain score of 3-4 that allows you to be functional, move, walk, and tolerate some activity is the goal. The pain will continue to improve over the days after surgery and is dependent on your surgery. -Due to [place] law, we are only able to give a certain amount of pain medication to treat post operative pain, and we only give additional narcotics on a patient by patient basis.  -For most laparoscopic surgery, studies have shown that the majority of patients only need 10-15 narcotic pills, and for open surgeries most patients only need 15-20.   -Having appropriate expectations of pain and knowledge of pain management with non narcotics is important as we do not want anyone to become addicted to narcotic pain medication.  -Using ice packs in the first 48 hours and heating pads after 48 hours, wearing an abdominal binder (when recommended), and using over the counter medications are all ways to help with pain management.   -Simple acts like meditation and mindfulness practices after surgery can also help with pain control and research has proven the benefit of these practices.  Medication: Take tylenol and ibuprofen as needed for pain control, alternating every 4-6 hours.  Example:  Tylenol 1000mg @ 6am, 12noon, 6pm, 12midnight (Do not exceed 4000mg of tylenol a day). Ibuprofen 800mg @ 9am, 3pm, 9pm, 3am (Do not exceed 3600mg of ibuprofen a day).    Take Roxicodone for breakthrough pain every 4 hours.  Take Colace for constipation related to narcotic pain medication. If you do not have a bowel movement in 2 days, take Miralax over the counter.  Drink plenty of water to also prevent constipation.   Contact Information: If you have questions or concerns, please call our office, [phone], Monday- Thursday 8AM-5PM and Friday 8AM-12Noon.  If it is after hours or on the weekend, please call [facility]  Main Number, [phone], [phone], and ask to speak to the surgeon on call for Dr. Capitola Ladson at [place].   

## 2020-12-25 LAB — TYPE AND SCREEN
ABO/RH(D): O POS
Antibody Screen: NEGATIVE
Unit division: 0
Unit division: 0

## 2020-12-25 LAB — BPAM RBC
Blood Product Expiration Date: 202208302359
Blood Product Expiration Date: 202208302359
Unit Type and Rh: 5100
Unit Type and Rh: 5100

## 2020-12-28 ENCOUNTER — Ambulatory Visit (INDEPENDENT_AMBULATORY_CARE_PROVIDER_SITE_OTHER): Payer: Medicare Other | Admitting: General Surgery

## 2020-12-28 ENCOUNTER — Other Ambulatory Visit: Payer: Self-pay

## 2020-12-28 ENCOUNTER — Encounter: Payer: Self-pay | Admitting: General Surgery

## 2020-12-28 VITALS — BP 136/64 | HR 67 | Temp 98.7°F | Resp 18 | Ht 63.0 in | Wt 222.0 lb

## 2020-12-28 DIAGNOSIS — Z09 Encounter for follow-up examination after completed treatment for conditions other than malignant neoplasm: Secondary | ICD-10-CM

## 2020-12-29 NOTE — Progress Notes (Signed)
Subjective:     Ann [name]  Patient presents for wound check, status post colostomy reversal.  Patient is very pleased with the results.  She has had some intermittent serosanguineous drainage from the lower portion of her midline incision.  Her appetite is good.  Her bowel movements are normal.  She denies any fever or chills. Objective:    BP 136/64   Pulse 67   Temp 98.7 F (37.1 C) (Other (Comment))   Resp 18   Ht '5\' 3"'$  (1.6 m)   Wt 222 lb (100.7 kg)   SpO2 90%   BMI 39.33 kg/m   General:  alert, cooperative, and no distress  Abdomen is soft.  Midline incision with a small area of serosanguineous drainage along the lower half of the midline incision.  Her former colostomy site is healing well.  Approximately one half of all staples were removed.     Assessment:    Doing well postoperatively.    Plan:   Patient will be following up with Dr. Constance Haw in 2 weeks.  Patient told to keep the wounds clean and dry with soap and water.

## 2021-01-01 ENCOUNTER — Telehealth: Payer: Self-pay

## 2021-01-01 NOTE — Telephone Encounter (Signed)
1025 am.  Attempted to reach patient for follow up but unsuccessful.  Palliative Care will discharge from services as we have been unable to reach patient.  31 am.  PCP office notified of patient discharge as we are unable to reach patient.

## 2021-01-03 ENCOUNTER — Telehealth: Payer: Self-pay

## 2021-01-03 NOTE — Telephone Encounter (Signed)
Mariann Laster with [facility] left a VM stating she is still waiting on you to fax the addedum to her. The fax number is [phone]

## 2021-01-03 NOTE — Telephone Encounter (Signed)
Pt was seen on 6/23 for follow up on her diabetes. She was also to have a diabetic foot exam. [facility] needs an addendum added to pt's office visit note notating her diabetic foot exam.

## 2021-01-11 ENCOUNTER — Other Ambulatory Visit: Payer: Self-pay

## 2021-01-11 ENCOUNTER — Ambulatory Visit (INDEPENDENT_AMBULATORY_CARE_PROVIDER_SITE_OTHER): Payer: Medicare Other | Admitting: General Surgery

## 2021-01-11 VITALS — BP 104/55 | HR 66 | Temp 97.0°F | Resp 18 | Ht 64.0 in | Wt 222.0 lb

## 2021-01-11 DIAGNOSIS — Z9889 Other specified postprocedural states: Secondary | ICD-10-CM

## 2021-01-11 DIAGNOSIS — T8131XA Disruption of external operation (surgical) wound, not elsewhere classified, initial encounter: Secondary | ICD-10-CM

## 2021-01-11 NOTE — Progress Notes (Signed)
[facility]  Doing well and feeling good. Remaining staples removed. Had some local reaction to the staples, mild erythema, no drainage. Minor <1cm dehiscence of the skin that is very shallow.   BP (!) 104/55   Pulse 66   Temp (!) 97 F (36.1 C) (Other (Comment))   Resp 18   Ht '5\' 4"'$  (1.626 m)   Wt 222 lb (100.7 kg)   SpO2 97%   BMI 38.11 kg/m  Neosporin and Abd applied to the midline wound  Will see patient back soon.   Future Appointments  Date Time Provider [place]  01/25/2021  2:15 PM Virl Cagey, MD [facility] None  03/19/2021  1:30 PM Nida, Marella Chimes, MD [facility] None   Curlene Labrum, MD [facility] [address] [place], [place] [address] [phone] (office)

## 2021-01-11 NOTE — Telephone Encounter (Signed)
Pt was seen 6/23 for follow up diabetes care, she was also supposed to have a diabetic foot exam. [facility] is requesting an addendum be added to pt's note if she had a diabetic foot exam and results.

## 2021-01-16 ENCOUNTER — Encounter: Payer: Medicare Other | Admitting: General Surgery

## 2021-01-23 NOTE — Telephone Encounter (Signed)
Noted  

## 2021-01-25 ENCOUNTER — Encounter: Payer: Medicare Other | Admitting: General Surgery

## 2021-01-30 ENCOUNTER — Other Ambulatory Visit: Payer: Self-pay

## 2021-01-30 ENCOUNTER — Encounter: Payer: Self-pay | Admitting: General Surgery

## 2021-01-30 ENCOUNTER — Ambulatory Visit (INDEPENDENT_AMBULATORY_CARE_PROVIDER_SITE_OTHER): Payer: Medicare Other | Admitting: General Surgery

## 2021-01-30 VITALS — BP 111/66 | HR 74 | Temp 98.4°F | Resp 20 | Ht 64.0 in | Wt 222.0 lb

## 2021-01-30 DIAGNOSIS — Z9889 Other specified postprocedural states: Secondary | ICD-10-CM

## 2021-01-30 NOTE — Patient Instructions (Addendum)
Watch the cough and if still with issues next week, call PCP. No heavy lifting > 10 lbs, excessive bending, pushing, pulling, or squatting for 6-8 weeks after surgery.

## 2021-01-30 NOTE — Progress Notes (Signed)
[facility] Note   HPI:  75 y.o. Female presents to clinic for follow-up evaluation after colostomy reversal. She is doing well and feels good. She is having regular Bms. Her midline is healed.   Review of Systems:  No fever or chills Eating Coughing for about 1 week All other review of systems: otherwise negative   Vital Signs:  BP 111/66   Pulse 74   Temp 98.4 F (36.9 C) (Oral)   Resp 20   Ht '5\' 4"'$  (1.626 m)   Wt 222 lb (100.7 kg)   SpO2 91%   BMI 38.11 kg/m    Physical Exam:  Physical Exam Vitals reviewed.  Cardiovascular:     Rate and Rhythm: Normal rate.  Pulmonary:     Effort: Pulmonary effort is normal.     Breath sounds: Normal breath sounds. No rales.  Abdominal:     General: There is no distension.     Palpations: Abdomen is soft.     Tenderness: There is no abdominal tenderness.     Comments: Midline healed, no hernia, ostomy site healed      Assessment:  75 y.o. yo Female s/p colostomy reversal. Doing great.  Plan:  Watch the cough and if still with issues next week, call PCP. Would not prescribe anything right now as this is likely viral illness.  No heavy lifting > 10 lbs, excessive bending, pushing, pulling, or squatting for 6-8 weeks after surgery.   PRN Follow up   Curlene Labrum, MD [facility] [address] [place], [place] [address] [phone] (office)

## 2021-02-07 ENCOUNTER — Other Ambulatory Visit: Payer: Self-pay

## 2021-02-07 ENCOUNTER — Other Ambulatory Visit (HOSPITAL_COMMUNITY): Payer: Self-pay | Admitting: Gerontology

## 2021-02-07 ENCOUNTER — Ambulatory Visit (HOSPITAL_COMMUNITY)
Admission: RE | Admit: 2021-02-07 | Discharge: 2021-02-07 | Disposition: A | Discharge status: Home / Self Care | Payer: Medicare Other | Source: Ambulatory Visit | Attending: Gerontology | Admitting: Gerontology

## 2021-02-07 DIAGNOSIS — R059 Cough, unspecified: Secondary | ICD-10-CM | POA: Diagnosis present

## 2021-03-14 LAB — T4, FREE: Free T4: 1.07 ng/dL (ref 0.82–1.77)

## 2021-03-14 LAB — COMPREHENSIVE METABOLIC PANEL
ALT: 14 IU/L (ref 0–32)
AST: 15 IU/L (ref 0–40)
Albumin/Globulin Ratio: 1.4 (ref 1.2–2.2)
Albumin: 4.2 g/dL (ref 3.7–4.7)
Alkaline Phosphatase: 141 IU/L — ABNORMAL HIGH (ref 44–121)
BUN/Creatinine Ratio: 22 (ref 12–28)
BUN: 17 mg/dL (ref 8–27)
Bilirubin Total: 0.3 mg/dL (ref 0.0–1.2)
CO2: 21 mmol/L (ref 20–29)
Calcium: 9.5 mg/dL (ref 8.7–10.3)
Chloride: 104 mmol/L (ref 96–106)
Creatinine, Ser: 0.78 mg/dL (ref 0.57–1.00)
Globulin, Total: 3 g/dL (ref 1.5–4.5)
Glucose: 110 mg/dL — ABNORMAL HIGH (ref 70–99)
Potassium: 4.9 mmol/L (ref 3.5–5.2)
Sodium: 142 mmol/L (ref 134–144)
Total Protein: 7.2 g/dL (ref 6.0–8.5)
eGFR: 79 mL/min/{1.73_m2} (ref >59)

## 2021-03-14 LAB — TSH: TSH: 6 u[IU]/mL — ABNORMAL HIGH (ref 0.450–4.500)

## 2021-03-14 LAB — LIPID PANEL
Chol/HDL Ratio: 5.4 ratio — ABNORMAL HIGH (ref 0.0–4.4)
Cholesterol, Total: 233 mg/dL — ABNORMAL HIGH (ref 100–199)
HDL: 43 mg/dL (ref >39)
LDL Chol Calc (NIH): 163 mg/dL — ABNORMAL HIGH (ref 0–99)
Triglycerides: 147 mg/dL (ref 0–149)
VLDL Cholesterol Cal: 27 mg/dL (ref 5–40)

## 2021-03-19 ENCOUNTER — Ambulatory Visit: Payer: Medicare Other | Admitting: "Endocrinology

## 2021-04-02 ENCOUNTER — Ambulatory Visit: Payer: Medicare Other | Admitting: "Endocrinology

## 2021-04-02 ENCOUNTER — Ambulatory Visit (INDEPENDENT_AMBULATORY_CARE_PROVIDER_SITE_OTHER): Payer: Medicare Other | Admitting: "Endocrinology

## 2021-04-02 ENCOUNTER — Encounter: Payer: Self-pay | Admitting: "Endocrinology

## 2021-04-02 VITALS — BP 102/68 | HR 76 | Ht 64.0 in | Wt 239.4 lb

## 2021-04-02 DIAGNOSIS — E2740 Unspecified adrenocortical insufficiency: Secondary | ICD-10-CM | POA: Diagnosis not present

## 2021-04-02 DIAGNOSIS — E1165 Type 2 diabetes mellitus with hyperglycemia: Secondary | ICD-10-CM

## 2021-04-02 DIAGNOSIS — I10 Essential (primary) hypertension: Secondary | ICD-10-CM

## 2021-04-02 DIAGNOSIS — E039 Hypothyroidism, unspecified: Secondary | ICD-10-CM | POA: Diagnosis not present

## 2021-04-02 DIAGNOSIS — E782 Mixed hyperlipidemia: Secondary | ICD-10-CM | POA: Diagnosis not present

## 2021-04-02 LAB — POCT GLYCOSYLATED HEMOGLOBIN (HGB A1C): HbA1c, POC (controlled diabetic range): 7.7 % — AB (ref 0.0–7.0)

## 2021-04-02 MED ORDER — LEVEMIR FLEXTOUCH 100 UNIT/ML ~~LOC~~ SOPN
50.0000 [IU] | PEN_INJECTOR | Freq: Every day | SUBCUTANEOUS | 1 refills | Status: DC
Start: 2021-04-02 — End: 2022-02-05

## 2021-04-02 MED ORDER — GLIPIZIDE ER 2.5 MG PO TB24: 5.0000 mg | ORAL_TABLET | Freq: Every day | ORAL | 1 refills | Status: DC
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
## 2021-04-02 NOTE — Patient Instructions (Signed)
                                     Advice for Weight Management  -For most of us the best way to lose weight is by diet management. Generally speaking, diet management means consuming less calories intentionally which over time brings about progressive weight loss.  This can be achieved more effectively by restricting carbohydrate consumption to the minimum possible.  So, it is critically important to know your numbers: how much calorie you are consuming and how much calorie you need. More importantly, our carbohydrates sources should be unprocessed or minimally processed complex starch food items.   Sometimes, it is important to balance nutrition by increasing protein intake (animal or plant source), fruits, and vegetables.  - Whole Food, Plant Predominant Nutrition is highly recommended: Eat Plenty of vegetables, Mushrooms, fruits, Legumes, Whole Grains, Nuts, seeds in lieu of processed meats, processed snacks/pastries red meat, poultry, eggs.  -Sticking to a routine mealtime to eat 3 meals a day and avoiding unnecessary snacks is shown to have a big role in weight control. Under normal circumstances, the only time we lose real weight is when we are hungry, so allow hunger to take place- hunger means no food between meal times, only water.  It is not advisable to starve.   -It is better to avoid simple carbohydrates including: Cakes, Sweet Desserts, Ice Cream, Soda (diet and regular), Sweet Tea, Candies, Chips, Cookies, Store Bought Juices, Alcohol in Excess of  1-2 drinks a day, Lemonade,  Artificial Sweeteners, Doughnuts, Coffee Creamers, "Sugar-free" Products, etc, etc.  This is not a complete list.....    -Consulting with certified diabetes educators is proven to provide you with the most accurate and current information on diet.  Also, you may be  interested in discussing diet options/exchanges , we can schedule a visit with Penny Crumpton, RDN, CDE for  individualized nutrition education.  -Exercise: If you are able: 30 -60 minutes a day ,4 days a week, or 150 minutes a week.  The longer the better.  Combine stretch, strength, and aerobic activities.  If you were told in the past that you have high risk for cardiovascular diseases, you may seek evaluation by your heart doctor prior to initiating moderate to intense exercise programs.                                  Additional Care Considerations for Diabetes   -Diabetes  is a chronic disease.  The most important care consideration is regular follow-up with your diabetes care provider with the goal being avoiding or delaying its complications and to take advantage of advances in medications and technology.    - Whole Food, Plant Predominant Nutrition is highly recommended: Eat Plenty of vegetables, Mushrooms, fruits, Legumes, Whole Grains, Nuts, seeds in lieu of processed meats, processed snacks/pastries red meat, poultry, eggs.  -Type 2 diabetes is known to coexist with other important comorbidities such as high blood pressure and high cholesterol.  It is critical to control not only the diabetes but also the high blood pressure and high cholesterol to minimize and delay the risk of complications including coronary artery disease, stroke, amputations, blindness, etc.    - Studies showed that people with diabetes will benefit from a class of medications known as ACE inhibitors and statins.  Unless   there are specific reasons not to be on these medications, the standard of care is to consider getting one from these groups of medications at an optimal doses.  These medications are generally considered safe and proven to help protect the heart and the kidneys.    - People with diabetes are encouraged to initiate and maintain regular follow-up with eye doctors, foot doctors, dentists , and if necessary heart and kidney doctors.     - It is highly recommended that people with diabetes quit smoking or  stay away from smoking, and get yearly  flu vaccine and pneumonia vaccine at least every 5 years.  One other important lifestyle recommendation is to ensure adequate sleep - at least 6-7 hours of uninterrupted sleep at night.  -Exercise: If you are able: 30 -60 minutes a day, 4 days a week, or 150 minutes a week.  The longer the better.  Combine stretch, strength, and aerobic activities.  If you were told in the past that you have high risk for cardiovascular diseases, you may seek evaluation by your heart doctor prior to initiating moderate to intense exercise programs.         

## 2021-05-15 ENCOUNTER — Encounter: Payer: Self-pay | Admitting: Internal Medicine

## 2021-07-31 ENCOUNTER — Encounter: Payer: Self-pay | Admitting: "Endocrinology

## 2021-07-31 ENCOUNTER — Other Ambulatory Visit: Payer: Self-pay

## 2021-07-31 ENCOUNTER — Ambulatory Visit (INDEPENDENT_AMBULATORY_CARE_PROVIDER_SITE_OTHER): Payer: Medicare Other | Admitting: "Endocrinology

## 2021-07-31 VITALS — BP 138/66 | HR 68 | Ht 64.0 in | Wt 254.4 lb

## 2021-07-31 DIAGNOSIS — E1165 Type 2 diabetes mellitus with hyperglycemia: Secondary | ICD-10-CM | POA: Diagnosis not present

## 2021-07-31 DIAGNOSIS — E782 Mixed hyperlipidemia: Secondary | ICD-10-CM

## 2021-07-31 DIAGNOSIS — I10 Essential (primary) hypertension: Secondary | ICD-10-CM

## 2021-07-31 DIAGNOSIS — E039 Hypothyroidism, unspecified: Secondary | ICD-10-CM

## 2021-07-31 DIAGNOSIS — E2740 Unspecified adrenocortical insufficiency: Secondary | ICD-10-CM

## 2021-07-31 LAB — HEMOGLOBIN A1C: Hemoglobin A1C: 8.7

## 2021-07-31 MED ORDER — GLIPIZIDE ER 5 MG PO TB24: 5.0000 mg | ORAL_TABLET | Freq: Every day | ORAL | 1 refills | Status: DC
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
## 2021-07-31 NOTE — Patient Instructions (Signed)

## 2021-08-21 ENCOUNTER — Encounter: Payer: Self-pay | Admitting: *Deleted

## 2021-09-04 ENCOUNTER — Telehealth: Payer: Self-pay | Admitting: "Endocrinology

## 2021-09-04 NOTE — Telephone Encounter (Signed)
Pt states she has recently experienced a sinus infection, which has caused her BG to be elevated. States she is taking her glipizide '5mg'$  daily and her levemir at bedtime but BG has continued to run in the 300s when she checks it between approximately 3-4p.m.  Pt states she had some Novolog left over and has been taking 8 units when her BG readings have been in the 300s. ?

## 2021-09-04 NOTE — Telephone Encounter (Signed)
Pt left a voicemail for you and would like for you to call her back. [phone] ?

## 2021-09-04 NOTE — Telephone Encounter (Signed)
Left a message making pt aware to stop the novolog and increase her levemir to 70 units at bedtime, to continue to test BG 4 times daily and call the office if her BG is >200 x 3 per Dr.Nida's orders. ?

## 2021-09-11 NOTE — Telephone Encounter (Signed)
Pt is calling about having readings over 200. ? ?4/15 114 and 271 ? ?4/16 83 and 244 ? ?4/17 91 and 277 ? ?4/18 138 and 271 right now. ? ?[phone] ? ?Pt says she is still checking it 4x a day but is only righting down morning and bedtime readings. Encouraged patient to write down all 4.  ? ? ?

## 2021-09-12 NOTE — Telephone Encounter (Signed)
Patient made aware.

## 2021-09-19 ENCOUNTER — Encounter: Payer: Self-pay | Admitting: Internal Medicine

## 2021-09-24 DIAGNOSIS — N390 Urinary tract infection, site not specified: Secondary | ICD-10-CM | POA: Diagnosis not present

## 2021-09-24 DIAGNOSIS — J9601 Acute respiratory failure with hypoxia: Secondary | ICD-10-CM | POA: Diagnosis not present

## 2021-09-24 DIAGNOSIS — Z1389 Encounter for screening for other disorder: Secondary | ICD-10-CM | POA: Diagnosis not present

## 2021-09-24 DIAGNOSIS — E1169 Type 2 diabetes mellitus with other specified complication: Secondary | ICD-10-CM | POA: Diagnosis not present

## 2021-09-24 DIAGNOSIS — I4891 Unspecified atrial fibrillation: Secondary | ICD-10-CM | POA: Diagnosis not present

## 2021-09-24 DIAGNOSIS — I509 Heart failure, unspecified: Secondary | ICD-10-CM | POA: Diagnosis not present

## 2021-09-24 DIAGNOSIS — E039 Hypothyroidism, unspecified: Secondary | ICD-10-CM | POA: Diagnosis not present

## 2021-09-24 DIAGNOSIS — Z0001 Encounter for general adult medical examination with abnormal findings: Secondary | ICD-10-CM | POA: Diagnosis not present

## 2021-09-24 DIAGNOSIS — Z23 Encounter for immunization: Secondary | ICD-10-CM | POA: Diagnosis not present

## 2021-09-24 DIAGNOSIS — I5033 Acute on chronic diastolic (congestive) heart failure: Secondary | ICD-10-CM | POA: Diagnosis not present

## 2021-09-24 DIAGNOSIS — J449 Chronic obstructive pulmonary disease, unspecified: Secondary | ICD-10-CM | POA: Diagnosis not present

## 2021-09-24 LAB — LIPID PANEL
Cholesterol: 107 (ref 0–200)
HDL: 39 (ref 35–70)
LDL Cholesterol: 47
Triglycerides: 120 (ref 40–160)

## 2021-09-24 LAB — BASIC METABOLIC PANEL
BUN: 17 (ref 4–21)
CO2: 25 — AB (ref 13–22)
Chloride: 102 (ref 99–108)
Creatinine: 1.1 (ref 0.5–1.1)
Glucose: 148
Potassium: 4.7 mEq/L (ref 3.5–5.1)
Sodium: 141 (ref 137–147)

## 2021-09-24 LAB — COMPREHENSIVE METABOLIC PANEL
Albumin: 3.9 (ref 3.5–5.0)
Globulin: 3.1

## 2021-09-24 LAB — TSH: TSH: 1.12 (ref 0.41–5.90)

## 2021-09-24 LAB — HEPATIC FUNCTION PANEL
ALT: 14 U/L (ref 7–35)
AST: 15 (ref 13–35)
Alkaline Phosphatase: 118 (ref 25–125)
Bilirubin, Total: 0.3

## 2021-09-24 LAB — HEMOGLOBIN A1C: Hemoglobin A1C: 9.1

## 2021-09-27 ENCOUNTER — Encounter (INDEPENDENT_AMBULATORY_CARE_PROVIDER_SITE_OTHER): Payer: Self-pay

## 2021-10-03 DIAGNOSIS — E1140 Type 2 diabetes mellitus with diabetic neuropathy, unspecified: Secondary | ICD-10-CM | POA: Diagnosis not present

## 2021-10-03 DIAGNOSIS — M79671 Pain in right foot: Secondary | ICD-10-CM | POA: Diagnosis not present

## 2021-10-03 DIAGNOSIS — E1151 Type 2 diabetes mellitus with diabetic peripheral angiopathy without gangrene: Secondary | ICD-10-CM | POA: Diagnosis not present

## 2021-10-03 DIAGNOSIS — M79672 Pain in left foot: Secondary | ICD-10-CM | POA: Diagnosis not present

## 2021-10-11 ENCOUNTER — Ambulatory Visit: Payer: Medicare Other | Admitting: Internal Medicine

## 2021-10-15 ENCOUNTER — Telehealth: Payer: Self-pay | Admitting: "Endocrinology

## 2021-10-15 NOTE — Telephone Encounter (Signed)
Pt states she did labs for Dr Legrand Rams earlier in the month, when we get those, let her know if she needs to do more for [place]

## 2021-10-15 NOTE — Telephone Encounter (Signed)
Tried to call pt and no answer, I left her a VM letting her know she does not need to do anymore labs right now

## 2021-10-15 NOTE — Telephone Encounter (Signed)
It looks like Dr.Fanta did them all.

## 2021-10-17 ENCOUNTER — Encounter: Payer: Self-pay | Admitting: Cardiology

## 2021-10-17 ENCOUNTER — Ambulatory Visit (INDEPENDENT_AMBULATORY_CARE_PROVIDER_SITE_OTHER): Payer: Medicare Other | Admitting: Cardiology

## 2021-10-17 VITALS — BP 116/64 | HR 80 | Ht 63.0 in | Wt 265.2 lb

## 2021-10-17 DIAGNOSIS — I2510 Atherosclerotic heart disease of native coronary artery without angina pectoris: Secondary | ICD-10-CM

## 2021-10-17 DIAGNOSIS — D6869 Other thrombophilia: Secondary | ICD-10-CM | POA: Diagnosis not present

## 2021-10-17 DIAGNOSIS — I10 Essential (primary) hypertension: Secondary | ICD-10-CM

## 2021-10-17 DIAGNOSIS — I4891 Unspecified atrial fibrillation: Secondary | ICD-10-CM | POA: Diagnosis not present

## 2021-10-17 NOTE — Patient Instructions (Addendum)
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
## 2021-10-23 ENCOUNTER — Other Ambulatory Visit (HOSPITAL_COMMUNITY): Payer: Self-pay | Admitting: Gerontology

## 2021-10-23 ENCOUNTER — Ambulatory Visit (HOSPITAL_COMMUNITY)
Admission: RE | Admit: 2021-10-23 | Discharge: 2021-10-23 | Disposition: A | Discharge status: Home / Self Care | Payer: Medicare Other | Source: Ambulatory Visit | Attending: Gerontology | Admitting: Gerontology

## 2021-10-23 DIAGNOSIS — M545 Low back pain, unspecified: Secondary | ICD-10-CM

## 2021-10-25 ENCOUNTER — Encounter: Payer: Self-pay | Admitting: Internal Medicine

## 2021-10-25 ENCOUNTER — Ambulatory Visit (INDEPENDENT_AMBULATORY_CARE_PROVIDER_SITE_OTHER): Payer: Medicare Other | Admitting: Internal Medicine

## 2021-10-25 ENCOUNTER — Other Ambulatory Visit: Payer: Self-pay

## 2021-10-25 ENCOUNTER — Ambulatory Visit: Payer: Medicare Other | Admitting: Internal Medicine

## 2021-10-25 VITALS — BP 120/72 | HR 82 | Temp 97.8°F | Ht 63.0 in | Wt 266.4 lb

## 2021-10-25 DIAGNOSIS — I5033 Acute on chronic diastolic (congestive) heart failure: Secondary | ICD-10-CM | POA: Diagnosis not present

## 2021-10-25 DIAGNOSIS — K219 Gastro-esophageal reflux disease without esophagitis: Secondary | ICD-10-CM | POA: Diagnosis not present

## 2021-10-25 DIAGNOSIS — I4891 Unspecified atrial fibrillation: Secondary | ICD-10-CM | POA: Diagnosis not present

## 2021-10-25 DIAGNOSIS — J9601 Acute respiratory failure with hypoxia: Secondary | ICD-10-CM | POA: Diagnosis not present

## 2021-10-25 DIAGNOSIS — D369 Benign neoplasm, unspecified site: Secondary | ICD-10-CM | POA: Diagnosis not present

## 2021-10-25 DIAGNOSIS — R32 Unspecified urinary incontinence: Secondary | ICD-10-CM

## 2021-10-25 DIAGNOSIS — E1169 Type 2 diabetes mellitus with other specified complication: Secondary | ICD-10-CM | POA: Diagnosis not present

## 2021-10-25 DIAGNOSIS — M19011 Primary osteoarthritis, right shoulder: Secondary | ICD-10-CM | POA: Diagnosis not present

## 2021-10-25 MED ORDER — ESOMEPRAZOLE MAGNESIUM 40 MG PO CPDR: 40.0000 mg | DELAYED_RELEASE_CAPSULE | Freq: Two times a day (BID) | ORAL | 3 refills | Status: DC

## 2021-10-25 NOTE — Addendum Note (Signed)
Addended by: Cheron Every on: 10/25/2021 11:09 AM   Modules accepted: Orders

## 2021-10-25 NOTE — Patient Instructions (Signed)
I am happy to hear you are doing better.  Continue on Nexium for your chronic reflux.  I sent a year supply refills to your mail in pharmacy.  We will hold off on colonoscopy for now.  Follow-up in 1 year or sooner if needed.  It was very nice seeing you again today.  Congratulations on your new great grandbaby!  Dr. Abbey Chatters
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
## 2021-10-29 NOTE — Addendum Note (Signed)
Addended by: Cheron Every on: 10/29/2021 11:20 AM   Modules accepted: Orders

## 2021-10-31 DIAGNOSIS — J449 Chronic obstructive pulmonary disease, unspecified: Secondary | ICD-10-CM | POA: Diagnosis not present

## 2021-11-01 ENCOUNTER — Ambulatory Visit (INDEPENDENT_AMBULATORY_CARE_PROVIDER_SITE_OTHER): Payer: Medicare Other | Admitting: "Endocrinology

## 2021-11-01 ENCOUNTER — Encounter: Payer: Self-pay | Admitting: "Endocrinology

## 2021-11-01 VITALS — BP 110/58 | HR 72 | Ht 63.0 in | Wt 265.0 lb

## 2021-11-01 DIAGNOSIS — I10 Essential (primary) hypertension: Secondary | ICD-10-CM

## 2021-11-01 DIAGNOSIS — E2740 Unspecified adrenocortical insufficiency: Secondary | ICD-10-CM

## 2021-11-01 DIAGNOSIS — E039 Hypothyroidism, unspecified: Secondary | ICD-10-CM | POA: Diagnosis not present

## 2021-11-01 DIAGNOSIS — E782 Mixed hyperlipidemia: Secondary | ICD-10-CM

## 2021-11-01 DIAGNOSIS — E1165 Type 2 diabetes mellitus with hyperglycemia: Secondary | ICD-10-CM | POA: Diagnosis not present

## 2021-11-01 MED ORDER — GLIPIZIDE ER 5 MG PO TB24: 5.0000 mg | ORAL_TABLET | Freq: Every day | ORAL | 1 refills | Status: DC
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
## 2021-11-01 NOTE — Patient Instructions (Signed)
°                                     Advice for Weight Management ° °-For most of us the best way to lose weight is by diet management. Generally speaking, diet management means consuming less calories intentionally which over time brings about progressive weight loss.  This can be achieved more effectively by avoiding ultra processed carbohydrates, processed meats, unhealthy fats.   ° It is critically important to know your numbers: how much calorie you are consuming and how much calorie you need. More importantly, our carbohydrates sources should be unprocessed naturally occurring  complex starch food items.  It is always important to balance nutrition also by  appropriate intake of proteins (mainly plant-based), healthy fats/oils, plenty of fruits and vegetables.  ° °-The American College of Lifestyle Medicine (ACL M) recommends nutrition derived mostly from Whole Food, Plant Predominant Sources example an apple instead of applesauce or apple pie. Eat Plenty of vegetables, Mushrooms, fruits, Legumes, Whole Grains, Nuts, seeds in lieu of processed meats, processed snacks/pastries red meat, poultry, eggs.  Use only water or unsweetened tea for hydration.  The College also recommends the need to stay away from risky substances including alcohol, smoking; obtaining 7-9 hours of restorative sleep, at least 150 minutes of moderate intensity exercise weekly, importance of healthy social connections, and being mindful of stress and seek help when it is overwhelming.   ° °-Sticking to a routine mealtime to eat 3 meals a day and avoiding unnecessary snacks is shown to have a big role in weight control. Under normal circumstances, the only time we lose real weight is when we are hungry, so allow hunger to take place- hunger means no food between meal times, only water.  It is not advisable to starve.  ° °-It is better to avoid simple carbohydrates including: Cakes, Sweet Desserts,  Ice Cream, Soda (diet and regular), Sweet Tea, Candies, Chips, Cookies, Store Bought Juices, Alcohol in Excess of  1-2 drinks a day, Lemonade,  Artificial Sweeteners, Doughnuts, Coffee Creamers, "Sugar-free" Products, etc, etc.  This is not a complete list.....  °  °-Consulting with certified diabetes educators is proven to provide you with the most accurate and current information on diet.  Also, you may be  interested in discussing diet options/exchanges , we can schedule a visit with Ann [name], RDN, CDE for individualized nutrition education. ° °-Exercise: If you are able: 30 -60 minutes a day ,4 days a week, or 150 minutes of moderate intensity exercise weekly.    The longer the better if tolerated.  Combine stretch, strength, and aerobic activities.  If you were told in the past that you have high risk for cardiovascular diseases, or if you are currently symptomatic, you may seek evaluation by your heart doctor prior to initiating moderate to intense exercise programs.   ° ° °                             Additional Care Considerations for Diabetes ° ° °-Diabetes  is a chronic disease.  The most important care consideration is regular follow-up with your diabetes care provider with the goal being avoiding or delaying its complications and to take advantage of advances in medications and technology.  If appropriate actions are taken early enough, type 2 diabetes can even be reversed.  Seek   information from the right source. ° °- Whole Food, Plant Predominant Nutrition is highly recommended: Eat Plenty of vegetables, Mushrooms, fruits, Legumes, Whole Grains, Nuts, seeds in lieu of processed meats, processed snacks/pastries red meat, poultry, eggs as recommended by American College of  Lifestyle Medicine (ACLM). ° °-Type 2 diabetes is known to coexist with other important comorbidities such as high blood pressure and high cholesterol.  It is critical to control not only the diabetes but also the high blood  pressure and high cholesterol to minimize and delay the risk of complications including coronary artery disease, stroke, amputations, blindness, etc.  °The good news is that this diet recommendation for type 2 diabetes is also very helpful for managing high cholesterol and high blood blood pressure. ° °- Studies showed that people with diabetes will benefit from a class of medications known as ACE inhibitors and statins.  Unless there are specific reasons not to be on these medications, the standard of care is to consider getting one from these groups of medications at an optimal doses.  These medications are generally considered safe and proven to help protect the heart and the kidneys.   ° °- People with diabetes are encouraged to initiate and maintain regular follow-up with eye doctors, foot doctors, dentists , and if necessary heart and kidney doctors.    ° °- It is highly recommended that people with diabetes quit smoking or stay away from smoking, and get yearly  flu vaccine and pneumonia vaccine at least every 5 years.  See above for additional recommendations on exercise, sleep, stress management , and healthy social connections. ° ° ° ° ° °

## 2021-11-07 ENCOUNTER — Telehealth: Payer: Self-pay | Admitting: "Endocrinology

## 2021-11-07 DIAGNOSIS — E1165 Type 2 diabetes mellitus with hyperglycemia: Secondary | ICD-10-CM

## 2021-11-07 MED ORDER — GLIPIZIDE ER 5 MG PO TB24: 5.0000 mg | ORAL_TABLET | Freq: Every day | ORAL | 3 refills | Status: DC

## 2021-11-07 NOTE — Telephone Encounter (Signed)
Pt called and is requesting to speak with Joy.

## 2021-11-07 NOTE — Telephone Encounter (Signed)
Pt requested Rx refills for glipizide. Refills sent to [facility] in [place] as requested.

## 2021-11-19 ENCOUNTER — Other Ambulatory Visit (HOSPITAL_COMMUNITY): Payer: Self-pay | Admitting: Gerontology

## 2021-11-19 ENCOUNTER — Ambulatory Visit (HOSPITAL_COMMUNITY)
Admission: RE | Admit: 2021-11-19 | Discharge: 2021-11-19 | Disposition: A | Discharge status: Home / Self Care | Payer: Medicare Other | Source: Ambulatory Visit | Attending: Gerontology | Admitting: Gerontology

## 2021-11-19 ENCOUNTER — Other Ambulatory Visit (HOSPITAL_COMMUNITY)
Admission: RE | Admit: 2021-11-19 | Discharge: 2021-11-19 | Disposition: A | Discharge status: Home / Self Care | Payer: Medicare Other | Source: Ambulatory Visit

## 2021-11-19 DIAGNOSIS — E860 Dehydration: Secondary | ICD-10-CM | POA: Diagnosis not present

## 2021-11-19 DIAGNOSIS — R059 Cough, unspecified: Secondary | ICD-10-CM

## 2021-11-19 DIAGNOSIS — R499 Unspecified voice and resonance disorder: Secondary | ICD-10-CM | POA: Diagnosis not present

## 2021-11-19 DIAGNOSIS — J449 Chronic obstructive pulmonary disease, unspecified: Secondary | ICD-10-CM | POA: Diagnosis not present

## 2021-11-19 LAB — BASIC METABOLIC PANEL
Anion gap: 11 (ref 5–15)
BUN: 23 mg/dL (ref 8–23)
CO2: 26 mmol/L (ref 22–32)
Calcium: 9 mg/dL (ref 8.9–10.3)
Chloride: 101 mmol/L (ref 98–111)
Creatinine, Ser: 1.19 mg/dL — ABNORMAL HIGH (ref 0.44–1.00)
GFR, Estimated: 47 mL/min — ABNORMAL LOW (ref >60)
Glucose, Bld: 240 mg/dL — ABNORMAL HIGH (ref 70–99)
Potassium: 4.3 mmol/L (ref 3.5–5.1)
Sodium: 138 mmol/L (ref 135–145)

## 2021-11-23 ENCOUNTER — Other Ambulatory Visit (HOSPITAL_COMMUNITY): Payer: Self-pay | Admitting: Internal Medicine

## 2021-11-23 ENCOUNTER — Other Ambulatory Visit: Payer: Self-pay | Admitting: Internal Medicine

## 2021-11-23 DIAGNOSIS — R9389 Abnormal findings on diagnostic imaging of other specified body structures: Secondary | ICD-10-CM

## 2021-11-24 DIAGNOSIS — J9601 Acute respiratory failure with hypoxia: Secondary | ICD-10-CM | POA: Diagnosis not present

## 2021-11-24 DIAGNOSIS — I5033 Acute on chronic diastolic (congestive) heart failure: Secondary | ICD-10-CM | POA: Diagnosis not present

## 2021-11-24 DIAGNOSIS — I4891 Unspecified atrial fibrillation: Secondary | ICD-10-CM | POA: Diagnosis not present

## 2021-12-13 ENCOUNTER — Emergency Department (HOSPITAL_COMMUNITY)
Admission: EM | Admit: 2021-12-13 | Discharge: 2021-12-13 | Disposition: A | Discharge status: Home / Self Care | Payer: Medicare Other | Attending: Emergency Medicine | Admitting: Emergency Medicine

## 2021-12-13 ENCOUNTER — Emergency Department (HOSPITAL_COMMUNITY): Payer: Medicare Other

## 2021-12-13 ENCOUNTER — Encounter (HOSPITAL_COMMUNITY): Payer: Self-pay

## 2021-12-13 DIAGNOSIS — R109 Unspecified abdominal pain: Secondary | ICD-10-CM | POA: Diagnosis not present

## 2021-12-13 DIAGNOSIS — Z79899 Other long term (current) drug therapy: Secondary | ICD-10-CM | POA: Diagnosis not present

## 2021-12-13 DIAGNOSIS — J9611 Chronic respiratory failure with hypoxia: Secondary | ICD-10-CM | POA: Diagnosis not present

## 2021-12-13 DIAGNOSIS — Z9981 Dependence on supplemental oxygen: Secondary | ICD-10-CM | POA: Diagnosis not present

## 2021-12-13 DIAGNOSIS — I2510 Atherosclerotic heart disease of native coronary artery without angina pectoris: Secondary | ICD-10-CM | POA: Insufficient documentation

## 2021-12-13 DIAGNOSIS — Z7901 Long term (current) use of anticoagulants: Secondary | ICD-10-CM | POA: Insufficient documentation

## 2021-12-13 DIAGNOSIS — N12 Tubulo-interstitial nephritis, not specified as acute or chronic: Secondary | ICD-10-CM | POA: Insufficient documentation

## 2021-12-13 DIAGNOSIS — Z794 Long term (current) use of insulin: Secondary | ICD-10-CM | POA: Insufficient documentation

## 2021-12-13 DIAGNOSIS — Z7984 Long term (current) use of oral hypoglycemic drugs: Secondary | ICD-10-CM | POA: Diagnosis not present

## 2021-12-13 DIAGNOSIS — I10 Essential (primary) hypertension: Secondary | ICD-10-CM | POA: Insufficient documentation

## 2021-12-13 DIAGNOSIS — J449 Chronic obstructive pulmonary disease, unspecified: Secondary | ICD-10-CM | POA: Diagnosis not present

## 2021-12-13 DIAGNOSIS — E119 Type 2 diabetes mellitus without complications: Secondary | ICD-10-CM | POA: Insufficient documentation

## 2021-12-13 DIAGNOSIS — M549 Dorsalgia, unspecified: Secondary | ICD-10-CM | POA: Diagnosis present

## 2021-12-13 LAB — URINALYSIS, ROUTINE W REFLEX MICROSCOPIC
Bilirubin Urine: NEGATIVE
Glucose, UA: NEGATIVE mg/dL
Hgb urine dipstick: NEGATIVE
Ketones, ur: NEGATIVE mg/dL
Nitrite: POSITIVE — AB
Protein, ur: NEGATIVE mg/dL
Specific Gravity, Urine: 1.015 (ref 1.005–1.030)
WBC, UA: >50 WBC/hpf — ABNORMAL HIGH (ref 0–5)
pH: 6 (ref 5.0–8.0)

## 2021-12-13 LAB — COMPREHENSIVE METABOLIC PANEL
ALT: 14 U/L (ref 0–44)
AST: 15 U/L (ref 15–41)
Albumin: 3.3 g/dL — ABNORMAL LOW (ref 3.5–5.0)
Alkaline Phosphatase: 94 U/L (ref 38–126)
Anion gap: 4 — ABNORMAL LOW (ref 5–15)
BUN: 24 mg/dL — ABNORMAL HIGH (ref 8–23)
CO2: 27 mmol/L (ref 22–32)
Calcium: 8.5 mg/dL — ABNORMAL LOW (ref 8.9–10.3)
Chloride: 107 mmol/L (ref 98–111)
Creatinine, Ser: 1.1 mg/dL — ABNORMAL HIGH (ref 0.44–1.00)
GFR, Estimated: 52 mL/min — ABNORMAL LOW (ref >60)
Glucose, Bld: 208 mg/dL — ABNORMAL HIGH (ref 70–99)
Potassium: 4.4 mmol/L (ref 3.5–5.1)
Sodium: 138 mmol/L (ref 135–145)
Total Bilirubin: 0.6 mg/dL (ref 0.3–1.2)
Total Protein: 6.8 g/dL (ref 6.5–8.1)

## 2021-12-13 LAB — CBC
HCT: 31.6 % — ABNORMAL LOW (ref 36.0–46.0)
Hemoglobin: 9.1 g/dL — ABNORMAL LOW (ref 12.0–15.0)
MCH: 22.2 pg — ABNORMAL LOW (ref 26.0–34.0)
MCHC: 28.8 g/dL — ABNORMAL LOW (ref 30.0–36.0)
MCV: 77.3 fL — ABNORMAL LOW (ref 80.0–100.0)
Platelets: 230 10*3/uL (ref 150–400)
RBC: 4.09 MIL/uL (ref 3.87–5.11)
RDW: 19.3 % — ABNORMAL HIGH (ref 11.5–15.5)
WBC: 10.5 10*3/uL (ref 4.0–10.5)
nRBC: 0 % (ref 0.0–0.2)

## 2021-12-13 MED ORDER — HYDROCODONE-ACETAMINOPHEN 5-325 MG PO TABS: 2.0000 | ORAL_TABLET | ORAL | 0 refills | Status: DC | PRN

## 2021-12-13 MED ORDER — IOHEXOL 300 MG/ML  SOLN
100.0000 mL | Freq: Once | INTRAMUSCULAR | Status: AC | PRN
  Administered 2021-12-13: 100 mL via INTRAVENOUS

## 2021-12-13 MED ORDER — MORPHINE SULFATE (PF) 4 MG/ML IV SOLN
4.0000 mg | Freq: Once | INTRAVENOUS | Status: AC
  Administered 2021-12-13: 4 mg via INTRAVENOUS
  Filled 2021-12-13: qty 1

## 2021-12-13 MED ORDER — ONDANSETRON HCL 4 MG/2ML IJ SOLN
4.0000 mg | Freq: Once | INTRAMUSCULAR | Status: AC
  Administered 2021-12-13: 4 mg via INTRAVENOUS
  Filled 2021-12-13: qty 2

## 2021-12-13 MED ORDER — CEFDINIR 300 MG PO CAPS: 300.0000 mg | ORAL_CAPSULE | Freq: Two times a day (BID) | ORAL | 0 refills | Status: AC

## 2021-12-13 MED ORDER — SODIUM CHLORIDE 0.9 % IV SOLN
1.0000 g | Freq: Once | INTRAVENOUS | Status: AC
  Administered 2021-12-13: 1 g via INTRAVENOUS
  Filled 2021-12-13: qty 10

## 2021-12-13 NOTE — ED Provider Notes (Signed)
[facility] EMERGENCY DEPARTMENT Provider Note   CSN: [id number] Arrival date & time: 12/13/21  9201     History  Chief Complaint  Patient presents with   Back Pain    Ann [name] is a 76 y.o. female.   Back Pain  76 year old female with history of diabetes, COPD on oxygen, hypertension, hyperlipidemia, coronary artery disease presenting with left-sided back pain.  Patient reports left-sided back pain since yesterday.  She reports she has tried Tylenol, ibuprofen without relief.  She denies numbness, tingling, weakness.  She ambulates with a walker and reports she has been ambulatory at baseline.  Denies bowel or bladder incontinence.  Denies fevers, chills, chest pain, shortness of breath.  Denies any urinary frequency, urgency, foul-smelling urine.  Reports she feels like a "spasm".  No trauma to the back.      Home Medications Prior to Admission medications   Medication Sig Start Date End Date Taking? Authorizing Provider  cefdinir (OMNICEF) 300 MG capsule Take 1 capsule (300 mg total) by mouth 2 (two) times daily for 7 days. 12/13/21 12/20/21 Yes Cristie Hem, MD  HYDROcodone-acetaminophen (NORCO/VICODIN) 5-325 MG tablet Take 2 tablets by mouth every 4 (four) hours as needed. 12/13/21  Yes Cristie Hem, MD  acetaminophen (TYLENOL) 325 MG tablet Take 2 tablets (650 mg total) by mouth every 6 (six) hours as needed for mild pain, moderate pain or fever. 12/11/19   Roxan Hockey, MD  albuterol (VENTOLIN HFA) 108 (90 Base) MCG/ACT inhaler Inhale 2 puffs into the lungs every 4 (four) hours as needed for wheezing. 05/31/20   Angiulli, Lavon Paganini, PA-C  apixaban (ELIQUIS) 5 MG TABS tablet Take 1 tablet (5 mg total) by mouth 2 (two) times daily. 05/31/20 10/17/68  Angiulli, Lavon Paganini, PA-C  atorvastatin (LIPITOR) 80 MG tablet Take 1 tablet (80 mg total) by mouth daily at 6 PM. 05/31/20   Angiulli, Lavon Paganini, PA-C  butalbital-acetaminophen-caffeine (FIORICET) 50-325-40 MG tablet  Take 1 tablet by mouth 2 (two) times daily. 03/19/21   [provider]  Carboxymethylcellulose Sod PF 0.5 % SOLN Place 2 drops into both eyes daily as needed (for dry eye relief).     [provider]  Cholecalciferol (VITAMIN D3) 125 MCG (5000 UT) CAPS Take 1 capsule (5,000 Units total) by mouth daily. 05/31/20   Angiulli, Lavon Paganini, PA-C  cyanocobalamin 1000 MCG tablet Take 1 tablet (1,000 mcg total) by mouth daily. 05/31/20   Angiulli, Lavon Paganini, PA-C  diltiazem (CARDIZEM CD) 240 MG 24 hr capsule Take 1 capsule (240 mg total) by mouth daily. 06/01/20   Angiulli, Lavon Paganini, PA-C  Docusate Sodium 100 MG capsule Take 100 mg by mouth daily.     [provider]  esomeprazole (NEXIUM) 40 MG capsule Take 1 capsule (40 mg total) by mouth 2 (two) times daily before a meal. 10/25/21 10/25/22  Eloise Harman, DO  furosemide (LASIX) 40 MG tablet Take 1 tablet (40 mg total) by mouth 2 (two) times daily. 05/31/20   Angiulli, Lavon Paganini, PA-C  gabapentin (NEURONTIN) 400 MG capsule Take 1 capsule (400 mg total) by mouth 3 (three) times daily. 05/31/20   Angiulli, Lavon Paganini, PA-C  glipiZIDE (GLUCOTROL XL) 5 MG 24 hr tablet Take 1 tablet (5 mg total) by mouth daily with breakfast. 11/07/21   Nida, Marella Chimes, MD  insulin detemir (LEVEMIR FLEXTOUCH) 100 UNIT/ML FlexPen Inject 50 Units into the skin at bedtime. 04/02/21   Cassandria Anger, MD  ipratropium-albuterol (DUONEB)  0.5-2.5 (3) MG/3ML SOLN Take 3 mLs by nebulization every 6 (six) hours as needed (copd). 03/17/20   [provider]  levothyroxine (SYNTHROID) 175 MCG tablet Take 1 tablet (175 mcg total) by mouth every morning. 11/16/20   Nida, Marella Chimes, MD  Menthol-Methyl Salicylate (SALONPAS PAIN RELIEF PATCH) PTCH Apply 1 patch topically daily as needed (pain).    [provider]  mirabegron ER (MYRBETRIQ) 50 MG TB24 tablet Take 1 tablet (50 mg total) by mouth daily. 05/31/20   Angiulli, Lavon Paganini, PA-C  nitroGLYCERIN  (NITROSTAT) 0.4 MG SL tablet Place 1 tablet (0.4 mg total) under the tongue every 5 (five) minutes as needed for chest pain. 10/15/12   Sinda Du, MD  ondansetron (ZOFRAN) 4 MG tablet Take 1 tablet (4 mg total) by mouth every 6 (six) hours as needed for nausea. 12/23/20   Virl Cagey, MD  predniSONE (DELTASONE) 10 MG tablet Take 1 tablet (10 mg total) by mouth daily with breakfast. 11/16/20   Cassandria Anger, MD  sertraline (ZOLOFT) 100 MG tablet Take 0.5 tablets (50 mg total) by mouth at bedtime. 05/31/20   Angiulli, Lavon Paganini, PA-C  solifenacin (VESICARE) 10 MG tablet Take 10 mg by mouth at bedtime.     [provider]  traZODone (DESYREL) 50 MG tablet Take 1 tablet (50 mg total) by mouth at bedtime as needed for sleep. 05/31/20   Angiulli, Lavon Paganini, PA-C  TRELEGY ELLIPTA 100-62.5-25 MCG/ACT AEPB Take 1 puff by mouth daily. 09/21/21   [provider]      Allergies    Ace inhibitors, Asa [aspirin], Tape, Niacin, and Reglan [metoclopramide]    Review of Systems   Review of Systems  Musculoskeletal:  Positive for back pain.  See HPI  Physical Exam Updated Vital Signs BP (!) 132/44   Pulse 71   Temp 97.9 F (36.6 C) (Oral)   Resp 19   Ht '5\' 3"'$  (1.6 m)   Wt 116.1 kg   SpO2 90%   BMI 45.35 kg/m  Physical Exam Vitals and nursing note reviewed.  Constitutional:      General: She is not in acute distress.    Appearance: She is obese.  HENT:     Head: Normocephalic and atraumatic.     Right Ear: External ear normal.     Left Ear: External ear normal.     Mouth/Throat:     Mouth: Mucous membranes are moist.  Eyes:     Conjunctiva/sclera: Conjunctivae normal.  Cardiovascular:     Rate and Rhythm: Normal rate and regular rhythm.  Pulmonary:     Effort: Pulmonary effort is normal. No respiratory distress.  Abdominal:     General: Abdomen is flat.     Palpations: Abdomen is soft.     Tenderness: There is left CVA tenderness. There is no right CVA  tenderness.  Musculoskeletal:     Comments: Trace bilateral lower extremity edema.  Left-sided paraspinal muscular tenderness.  No midline C, T, L-spine tenderness.  Skin:    General: Skin is warm and dry.     Capillary Refill: Capillary refill takes less than 2 seconds.  Neurological:     General: No focal deficit present.     Mental Status: She is alert. Mental status is at baseline.     Comments: 5 out of 5 strength and sensation to light touch in the bilateral lower extremities  Psychiatric:        Mood and Affect: Mood normal.  Behavior: Behavior normal.     ED Results / Procedures / Treatments   Labs (all labs ordered are listed, but only abnormal results are displayed) Labs Reviewed  URINALYSIS, ROUTINE W REFLEX MICROSCOPIC - Abnormal; Notable for the following components:      Result Value   APPearance CLOUDY (*)    Nitrite POSITIVE (*)    Leukocytes,Ua LARGE (*)    WBC, UA >50 (*)    Bacteria, UA MANY (*)    All other components within normal limits  COMPREHENSIVE METABOLIC PANEL - Abnormal; Notable for the following components:   Glucose, Bld 208 (*)    BUN 24 (*)    Creatinine, Ser 1.10 (*)    Calcium 8.5 (*)    Albumin 3.3 (*)    GFR, Estimated 52 (*)    Anion gap 4 (*)    All other components within normal limits  CBC - Abnormal; Notable for the following components:   Hemoglobin 9.1 (*)    HCT 31.6 (*)    MCV 77.3 (*)    MCH 22.2 (*)    MCHC 28.8 (*)    RDW 19.3 (*)    All other components within normal limits  URINE CULTURE    EKG None 3 Radiology CT ABDOMEN PELVIS W WO CONTRAST  Result Date: 12/13/2021 CLINICAL DATA:  Flank pain, LEFT back pain with back spasms and nausea, this began yesterday. EXAM: CT ABDOMEN AND PELVIS WITHOUT AND WITH CONTRAST TECHNIQUE: Multidetector CT imaging of the abdomen and pelvis was performed following the standard protocol before and following the bolus administration of intravenous contrast. RADIATION DOSE  REDUCTION: This exam was performed according to the departmental dose-optimization program which includes automated exposure control, adjustment of the mA and/or kV according to patient size and/or use of iterative reconstruction technique. CONTRAST:  124m OMNIPAQUE IOHEXOL 300 MG/ML  SOLN COMPARISON:  CT abdomen and pelvis from June 06, 2020. FINDINGS: Lower chest: Basilar atelectasis. Moderate cardiomegaly incompletely assessed. Hepatobiliary: No focal, suspicious hepatic lesion. Post cholecystectomy. No substantial biliary duct dilation. Portal vein is patent. Pancreas: Mild pancreatic atrophy without signs of inflammation. Spleen: Normal. Adrenals/Urinary Tract: Adrenal glands are normal. Mild cortical scarring of the bilateral kidneys. No hydronephrosis. No perinephric stranding. Urinary bladder with smooth contours. Punctate calculi in the upper pole of the RIGHT kidney. No LEFT renal calculi. Stomach/Bowel: Stomach is under distended without adjacent stranding. Small bowel is of normal caliber. Hernia in the LEFT lower quadrant at the site of previous ostomy no stranding adjacent to the colon. Signs of colonic anastomosis in the pelvis. Appendix not visualized, no secondary signs to suggest acute appendiceal process. Vascular/Lymphatic: Aortic atherosclerosis. No sign of aneurysm. Smooth contour of the IVC. There is no gastrohepatic or hepatoduodenal ligament lymphadenopathy. No retroperitoneal or mesenteric lymphadenopathy. No pelvic sidewall lymphadenopathy. Atherosclerotic changes are mild to moderate. Reproductive: No ascites. No pneumoperitoneum. Mild stranding over the anterior body wall has been seen to variable degrees since previous imaging was obtained in 2022 and tracks along the inferior margin of the patient's pannus in the midline. Other: No free air.  No ascites.  No pneumoperitoneum. Musculoskeletal: No acute bone finding. No destructive bone process. Spinal degenerative changes.  IMPRESSION: 1. Punctate calculi in the upper pole of the RIGHT kidney. No ureteral calculi or obstruction. 2. Hernia in the LEFT lower quadrant at the site of previous ostomy, no sign of obstruction 3. Mild stranding over the anterior body wall has been seen to variable degrees since previous imaging  was obtained in 2022 and tracks along the inferior margin of the patient's pannus in the midline. This may represent mild changes of panniculitis or postoperative change, correlate clinically. 4. Aortic atherosclerosis. Aortic Atherosclerosis (ICD10-I70.0). Electronically Signed   By: Zetta Bills M.D.   On: 12/13/2021 12:38    Procedures Procedures    Medications Ordered in ED Medications  morphine (PF) 4 MG/ML injection 4 mg (has no administration in time range)  ondansetron (ZOFRAN) injection 4 mg (4 mg Intravenous Given 12/13/21 1003)  morphine (PF) 4 MG/ML injection 4 mg (4 mg Intravenous Given 12/13/21 1004)  cefTRIAXone (ROCEPHIN) 1 g in sodium chloride 0.9 % 100 mL IVPB (0 g Intravenous Stopped 12/13/21 1050)  iohexol (OMNIPAQUE) 300 MG/ML solution 100 mL (100 mLs Intravenous Contrast Given 12/13/21 1149)    ED Course/ Medical Decision Making/ A&P Clinical Course as of 12/13/21 1252  Thu Dec 13, 2021  1016 Urinalysis, Routine w reflex microscopic(!) UA concerning for pyelonephritis. Given comorbities, age, significant pain will obtain CT to evaluate for abscess, kidney stone. If negative likely discharge pending labs if pain controlled. [WS]  0350 Patient feeling better, mild ongoing pain. Will give additional dose of morphine prior to discharge home. Will go home with family. CT scan without sign of obstructive stone or other acute intraabdominal process. Will discharge patient to home. All questions answered. Patient comfortable with plan of discharge. Return precautions discussed with patient and specified on the after visit summary.  [WS]    Clinical Course User Index [WS] Cristie Hem, MD                           Medical Decision Making Amount and/or Complexity of Data Reviewed Labs: ordered. Decision-making details documented in ED Course. Radiology: ordered.  Risk Prescription drug management.   76 year old female presenting with back pain.  Patient well-appearing, exam notable for left-sided paraspinal muscular tenderness and possibly left-sided CVA tenderness.  Differential includes muscular spasm, urinary tract infection, nephrolithiasis.  No midline back pain to suggest fracture and patient denies history of trauma.  Doubt occult spinal cord processes without numbness, tingling, fevers, chills, midline back pain or other red flag symptoms.  Will check basic labs, urinalysis, treat pain, and reassess.  If urinalysis or labs abnormal will consider imaging         Final Clinical Impression(s) / ED Diagnoses Final diagnoses:  Pyelonephritis  Flank pain  Chronic respiratory failure with hypoxia, on home oxygen therapy ([place])    Rx / DC Orders ED Discharge Orders          Ordered    HYDROcodone-acetaminophen (NORCO/VICODIN) 5-325 MG tablet  Every 4 hours PRN        12/13/21 1246    cefdinir (OMNICEF) 300 MG capsule  2 times daily        12/13/21 1246              Cristie Hem, MD 12/13/21 1252

## 2021-12-13 NOTE — ED Triage Notes (Signed)
Pt arrives complaining of back spasms. Pt states that she has hx of arthritis in her back. No relief with pain patches, ibuprofen, nor tylenol

## 2021-12-13 NOTE — ED Notes (Signed)
Pt ambulated to restroom with 1 assist. Pt wears 2L  at home.

## 2021-12-14 ENCOUNTER — Ambulatory Visit (HOSPITAL_COMMUNITY)
Admission: RE | Admit: 2021-12-14 | Discharge: 2021-12-14 | Disposition: A | Discharge status: Home / Self Care | Payer: Medicare Other | Source: Ambulatory Visit | Attending: Internal Medicine | Admitting: Internal Medicine

## 2021-12-14 DIAGNOSIS — R0602 Shortness of breath: Secondary | ICD-10-CM | POA: Diagnosis not present

## 2021-12-14 DIAGNOSIS — R9389 Abnormal findings on diagnostic imaging of other specified body structures: Secondary | ICD-10-CM

## 2021-12-14 DIAGNOSIS — E1165 Type 2 diabetes mellitus with hyperglycemia: Secondary | ICD-10-CM | POA: Diagnosis not present

## 2021-12-17 LAB — URINE CULTURE: Culture: >=100000 — AB

## 2021-12-18 ENCOUNTER — Telehealth: Payer: Self-pay | Admitting: *Deleted

## 2021-12-18 NOTE — Telephone Encounter (Signed)
Post ED Visit - Positive Culture Follow-up: Unsuccessful Patient Follow-up  Culture assessed and recommendations reviewed by:  '[]'$  Elenor Quinones, Pharm.D. '[]'$  Heide Guile, Pharm.D., BCPS AQ-ID '[]'$  Parks Neptune, Pharm.D., BCPS '[]'$  Alycia Rossetti, Pharm.D., BCPS '[]'$  [place], Pharm.D., BCPS, AAHIVP '[]'$  Legrand Como, Pharm.D., BCPS, AAHIVP '[]'$  Wynell Balloon, PharmD '[]'$  Vincenza Hews, PharmD, BCPS  Positive urine culture  '[]'$  Patient discharged without antimicrobial prescription and treatment is now indicated '[x]'$  Organism is resistant to prescribed ED discharge antimicrobial '[]'$  Patient with positive blood cultures  Plan:  Stop Cefdinir.  Start Ciprofloxacin '500mg'$  PO q12 hrs,x 5 days, Domenic Moras, PA-C Unable to contact patient after 3 attempts, letter will be sent to address on file  Ardeen Fillers 12/18/2021, 10:04 AM

## 2021-12-18 NOTE — Progress Notes (Signed)
ED Antimicrobial Stewardship Positive Culture Follow Up   Ann [name] is an 76 y.o. female who presented to [facility] on 12/14/2021 with a chief complaint of No chief complaint on file.   Recent Results (from the past 720 hour(s))  Urine Culture     Status: Abnormal   Collection Time: 12/13/21  9:55 AM   Specimen: Urine, Clean Catch  Result Value Ref Range Status   Specimen Description   Final    URINE, CLEAN CATCH Performed at [facility], [address], [place], [place] [address]    Special Requests   Final    NONE Performed at [facility], [address], [place], [place] [address]    Culture >=100,000 COLONIES/mL CITROBACTER FREUNDII (A)  Final   Report Status 12/17/2021 FINAL  Final   Organism ID, Bacteria CITROBACTER FREUNDII (A)  Final      Susceptibility   Citrobacter freundii - MIC*    CEFAZOLIN >=64 RESISTANT Resistant     CEFEPIME <=0.12 SENSITIVE Sensitive     CEFTRIAXONE 1 SENSITIVE Sensitive     CIPROFLOXACIN <=0.25 SENSITIVE Sensitive     GENTAMICIN <=1 SENSITIVE Sensitive     IMIPENEM 1 SENSITIVE Sensitive     NITROFURANTOIN <=16 SENSITIVE Sensitive     TRIMETH/SULFA <=20 SENSITIVE Sensitive     PIP/TAZO <=4 SENSITIVE Sensitive     * >=100,000 COLONIES/mL CITROBACTER FREUNDII    '[x]'$  Treated with cefdinir, organism resistant to prescribed antimicrobial  New antibiotic prescription: Stop cefdinir. Start ciprofloxacin '500mg'$  PO q12h x 5 days.  ED Provider: Domenic Moras, PA-C   [name] 12/18/2021, 8:46 AM Clinical Pharmacist Monday - Friday phone -  [phone] Saturday - Sunday phone - [phone]

## 2021-12-20 DIAGNOSIS — J449 Chronic obstructive pulmonary disease, unspecified: Secondary | ICD-10-CM | POA: Diagnosis not present

## 2021-12-20 DIAGNOSIS — N390 Urinary tract infection, site not specified: Secondary | ICD-10-CM | POA: Diagnosis not present

## 2021-12-20 DIAGNOSIS — N10 Acute tubulo-interstitial nephritis: Secondary | ICD-10-CM | POA: Diagnosis not present

## 2021-12-20 DIAGNOSIS — E1169 Type 2 diabetes mellitus with other specified complication: Secondary | ICD-10-CM | POA: Diagnosis not present

## 2021-12-22 ENCOUNTER — Telehealth (HOSPITAL_BASED_OUTPATIENT_CLINIC_OR_DEPARTMENT_OTHER): Payer: Self-pay | Admitting: *Deleted

## 2021-12-22 NOTE — Progress Notes (Signed)
Patient previously on ED culture report. Please see note from [facility] from 7/21. Placement RN informs me that patient returned phone call from letter that was sent given telephone contact not made.  Patient was started on Bactrim by PCP in interim due to lack of improvement. Bactrim is appropriate for urine culture in question. No need for ciprofloxacin, continue Bactrim as prescribed by PCP.  Lorelei Pont, PharmD, BCPS 12/22/2021 11:27 AM ED Clinical Pharmacist -  [phone]

## 2021-12-22 NOTE — Telephone Encounter (Signed)
Post ED Visit - Positive Culture Follow-up  Culture report reviewed by antimicrobial stewardship pharmacist: [place] Team '[]'$  Elenor Quinones, Pharm.D. '[]'$  Heide Guile, Pharm.D., BCPS AQ-ID '[]'$  Parks Neptune, Pharm.D., BCPS '[]'$  Alycia Rossetti, Pharm.D., BCPS '[]'$  [place], Pharm.D., BCPS, AAHIVP '[]'$  Legrand Como, Pharm.D., BCPS, AAHIVP '[]'$  Salome Arnt, PharmD, BCPS '[]'$  Johnnette Gourd, PharmD, BCPS '[]'$  Hughes Better, PharmD, BCPS '[]'$  Leeroy Cha, PharmD '[]'$  Laqueta Linden, PharmD, BCPS '[x]'$  Franchot Gallo, PharmD  [place] Team '[]'$  Leodis Sias, PharmD '[]'$  Lindell Spar, PharmD '[]'$  Royetta Asal, PharmD '[]'$  Graylin Shiver, Rph '[]'$  Rema Fendt) Glennon Mac, PharmD '[]'$  Arlyn Dunning, PharmD '[]'$  Netta Cedars, PharmD '[]'$  Dia Sitter, PharmD '[]'$  Leone Haven, PharmD '[]'$  Gretta Arab, PharmD '[]'$  Theodis Shove, PharmD '[]'$  Peggyann Juba, PharmD '[]'$  Reuel Boom, PharmD   Positive urine culture Pt returned call after receiving letter. Pt stated her primary doctor had already prescribed Bactrim for her UTI. No further treatment needed per Lorelei Pont, Pharm D Rosie Fate 12/22/2021, 11:28 AM

## 2021-12-25 DIAGNOSIS — J9601 Acute respiratory failure with hypoxia: Secondary | ICD-10-CM | POA: Diagnosis not present

## 2021-12-25 DIAGNOSIS — I4891 Unspecified atrial fibrillation: Secondary | ICD-10-CM | POA: Diagnosis not present

## 2021-12-25 DIAGNOSIS — I5033 Acute on chronic diastolic (congestive) heart failure: Secondary | ICD-10-CM | POA: Diagnosis not present

## 2021-12-27 DIAGNOSIS — M79671 Pain in right foot: Secondary | ICD-10-CM | POA: Diagnosis not present

## 2021-12-27 DIAGNOSIS — E1151 Type 2 diabetes mellitus with diabetic peripheral angiopathy without gangrene: Secondary | ICD-10-CM | POA: Diagnosis not present

## 2021-12-27 DIAGNOSIS — E1140 Type 2 diabetes mellitus with diabetic neuropathy, unspecified: Secondary | ICD-10-CM | POA: Diagnosis not present

## 2021-12-27 DIAGNOSIS — M79672 Pain in left foot: Secondary | ICD-10-CM | POA: Diagnosis not present

## 2022-01-09 ENCOUNTER — Other Ambulatory Visit: Payer: Self-pay

## 2022-01-13 DIAGNOSIS — E1165 Type 2 diabetes mellitus with hyperglycemia: Secondary | ICD-10-CM | POA: Diagnosis not present

## 2022-01-21 ENCOUNTER — Encounter: Payer: Self-pay | Admitting: Urology

## 2022-01-21 ENCOUNTER — Ambulatory Visit (INDEPENDENT_AMBULATORY_CARE_PROVIDER_SITE_OTHER): Payer: Medicare Other | Admitting: Urology

## 2022-01-21 VITALS — BP 132/61 | HR 118

## 2022-01-21 DIAGNOSIS — N390 Urinary tract infection, site not specified: Secondary | ICD-10-CM | POA: Diagnosis not present

## 2022-01-21 DIAGNOSIS — I10 Essential (primary) hypertension: Secondary | ICD-10-CM | POA: Diagnosis not present

## 2022-01-21 DIAGNOSIS — N3281 Overactive bladder: Secondary | ICD-10-CM

## 2022-01-21 DIAGNOSIS — E1165 Type 2 diabetes mellitus with hyperglycemia: Secondary | ICD-10-CM | POA: Diagnosis not present

## 2022-01-21 LAB — URINALYSIS, ROUTINE W REFLEX MICROSCOPIC
Bilirubin, UA: NEGATIVE
Glucose, UA: NEGATIVE
Ketones, UA: NEGATIVE
Leukocytes,UA: NEGATIVE
Nitrite, UA: NEGATIVE
Specific Gravity, UA: 1.02 (ref 1.005–1.030)
Urobilinogen, Ur: 0.2 mg/dL (ref 0.2–1.0)
pH, UA: 5.5 (ref 5.0–7.5)

## 2022-01-21 LAB — MICROSCOPIC EXAMINATION
Epithelial Cells (non renal): NONE SEEN /hpf (ref 0–10)
RBC, Urine: >30 /hpf — AB (ref 0–2)
Renal Epithel, UA: NONE SEEN /hpf

## 2022-01-21 LAB — BLADDER SCAN AMB NON-IMAGING: Scan Result: 1

## 2022-01-21 MED ORDER — GEMTESA 75 MG PO TABS: 1.0000 | ORAL_TABLET | Freq: Every day | ORAL | 0 refills | Status: DC

## 2022-01-21 NOTE — Progress Notes (Signed)
post void residual=1 
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
## 2022-01-21 NOTE — Patient Instructions (Signed)
Urinary Tract Infection, Adult A urinary tract infection (UTI) is an infection of any part of the urinary tract. The urinary tract includes: The kidneys. The ureters. The bladder. The urethra. These organs make, store, and get rid of pee (urine) in the body. What are the causes? This infection is caused by germs (bacteria) in your genital area. These germs grow and cause swelling (inflammation) of your urinary tract. What increases the risk? The following factors may make you more likely to develop this condition: Using a small, thin tube (catheter) to drain pee. Not being able to control when you pee or poop (incontinence). Being female. If you are female, these things can increase the risk: Using these methods to prevent pregnancy: A medicine that kills sperm (spermicide). A device that blocks sperm (diaphragm). Having low levels of a female hormone (estrogen). Being pregnant. You are more likely to develop this condition if: You have genes that add to your risk. You are sexually active. You take antibiotic medicines. You have trouble peeing because of: A prostate that is bigger than normal, if you are female. A blockage in the part of your body that drains pee from the bladder. A kidney stone. A nerve condition that affects your bladder. Not getting enough to drink. Not peeing often enough. You have other conditions, such as: Diabetes. A weak disease-fighting system (immune system). Sickle cell disease. Gout. Injury of the spine. What are the signs or symptoms? Symptoms of this condition include: Needing to pee right away. Peeing small amounts often. Pain or burning when peeing. Blood in the pee. Pee that smells bad or not like normal. Trouble peeing. Pee that is cloudy. Fluid coming from the vagina, if you are female. Pain in the belly or lower back. Other symptoms include: Vomiting. Not feeling hungry. Feeling mixed up (confused). This may be the first symptom in  older adults. Being tired and grouchy (irritable). A fever. Watery poop (diarrhea). How is this treated? Taking antibiotic medicine. Taking other medicines. Drinking enough water. In some cases, you may need to see a specialist. Follow these instructions at home:  Medicines Take over-the-counter and prescription medicines only as told by your doctor. If you were prescribed an antibiotic medicine, take it as told by your doctor. Do not stop taking it even if you start to feel better. General instructions Make sure you: Pee until your bladder is empty. Do not hold pee for a long time. Empty your bladder after sex. Wipe from front to back after peeing or pooping if you are a female. Use each tissue one time when you wipe. Drink enough fluid to keep your pee pale yellow. Keep all follow-up visits. Contact a doctor if: You do not get better after 1-2 days. Your symptoms go away and then come back. Get help right away if: You have very bad back pain. You have very bad pain in your lower belly. You have a fever. You have chills. You feeling like you will vomit or you vomit. Summary A urinary tract infection (UTI) is an infection of any part of the urinary tract. This condition is caused by germs in your genital area. There are many risk factors for a UTI. Treatment includes antibiotic medicines. Drink enough fluid to keep your pee pale yellow. This information is not intended to replace advice given to you by your health care provider. Make sure you discuss any questions you have with your health care provider. Document Revised: 12/24/2019 Document Reviewed: 12/24/2019 Elsevier Patient Education    2023 [employer]  

## 2022-01-23 LAB — URINE CULTURE

## 2022-01-25 DIAGNOSIS — I5033 Acute on chronic diastolic (congestive) heart failure: Secondary | ICD-10-CM | POA: Diagnosis not present

## 2022-01-25 DIAGNOSIS — J9601 Acute respiratory failure with hypoxia: Secondary | ICD-10-CM | POA: Diagnosis not present

## 2022-01-25 DIAGNOSIS — I4891 Unspecified atrial fibrillation: Secondary | ICD-10-CM | POA: Diagnosis not present

## 2022-02-05 ENCOUNTER — Ambulatory Visit (INDEPENDENT_AMBULATORY_CARE_PROVIDER_SITE_OTHER): Payer: Medicare Other | Admitting: "Endocrinology

## 2022-02-05 ENCOUNTER — Encounter: Payer: Self-pay | Admitting: "Endocrinology

## 2022-02-05 VITALS — BP 112/82 | HR 64 | Ht 63.0 in | Wt 278.0 lb

## 2022-02-05 DIAGNOSIS — E1165 Type 2 diabetes mellitus with hyperglycemia: Secondary | ICD-10-CM

## 2022-02-05 DIAGNOSIS — E782 Mixed hyperlipidemia: Secondary | ICD-10-CM

## 2022-02-05 DIAGNOSIS — I10 Essential (primary) hypertension: Secondary | ICD-10-CM | POA: Diagnosis not present

## 2022-02-05 DIAGNOSIS — E039 Hypothyroidism, unspecified: Secondary | ICD-10-CM

## 2022-02-05 DIAGNOSIS — E2740 Unspecified adrenocortical insufficiency: Secondary | ICD-10-CM | POA: Diagnosis not present

## 2022-02-05 LAB — POCT GLYCOSYLATED HEMOGLOBIN (HGB A1C): HbA1c, POC (controlled diabetic range): 8.8 % — AB (ref 0.0–7.0)

## 2022-02-05 MED ORDER — INSULIN LISPRO PROT & LISPRO (75-25 MIX) 100 UNIT/ML KWIKPEN
40.0000 [IU] | PEN_INJECTOR | Freq: Two times a day (BID) | SUBCUTANEOUS | 2 refills | Status: DC
Start: 2022-02-05 — End: 2022-08-01
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
## 2022-02-05 NOTE — Patient Instructions (Signed)
                                     Advice for Weight Management  -For most of us the best way to lose weight is by diet management. Generally speaking, diet management means consuming less calories intentionally which over time brings about progressive weight loss.  This can be achieved more effectively by avoiding ultra processed carbohydrates, processed meats, unhealthy fats.    It is critically important to know your numbers: how much calorie you are consuming and how much calorie you need. More importantly, our carbohydrates sources should be unprocessed naturally occurring  complex starch food items.  It is always important to balance nutrition also by  appropriate intake of proteins (mainly plant-based), healthy fats/oils, plenty of fruits and vegetables.   -The American College of Lifestyle Medicine (ACL M) recommends nutrition derived mostly from Whole Food, Plant Predominant Sources example an apple instead of applesauce or apple pie. Eat Plenty of vegetables, Mushrooms, fruits, Legumes, Whole Grains, Nuts, seeds in lieu of processed meats, processed snacks/pastries red meat, poultry, eggs.  Use only water or unsweetened tea for hydration.  The College also recommends the need to stay away from risky substances including alcohol, smoking; obtaining 7-9 hours of restorative sleep, at least 150 minutes of moderate intensity exercise weekly, importance of healthy social connections, and being mindful of stress and seek help when it is overwhelming.    -Sticking to a routine mealtime to eat 3 meals a day and avoiding unnecessary snacks is shown to have a big role in weight control. Under normal circumstances, the only time we burn stored energy is when we are hungry, so allow  some hunger to take place- hunger means no food between appropriate meal times, only water.  It is not advisable to starve.   -It is better to avoid simple carbohydrates including:  Cakes, Sweet Desserts, Ice Cream, Soda (diet and regular), Sweet Tea, Candies, Chips, Cookies, Store Bought Juices, Alcohol in Excess of  1-2 drinks a day, Lemonade,  Artificial Sweeteners, Doughnuts, Coffee Creamers, "Sugar-free" Products, etc, etc.  This is not a complete list.....    -Consulting with certified diabetes educators is proven to provide you with the most accurate and current information on diet.  Also, you may be  interested in discussing diet options/exchanges , we can schedule a visit with Ann [name], RDN, CDE for individualized nutrition education.  -Exercise: If you are able: 30 -60 minutes a day ,4 days a week, or 150 minutes of moderate intensity exercise weekly.    The longer the better if tolerated.  Combine stretch, strength, and aerobic activities.  If you were told in the past that you have high risk for cardiovascular diseases, or if you are currently symptomatic, you may seek evaluation by your heart doctor prior to initiating moderate to intense exercise programs.                                  Additional Care Considerations for Diabetes/Prediabetes   -Diabetes  is a chronic disease.  The most important care consideration is regular follow-up with your diabetes care provider with the goal being avoiding or delaying its complications and to take advantage of advances in medications and technology.  If appropriate actions are taken early enough, type 2 diabetes can even be   reversed.  Seek information from the right source.  - Whole Food, Plant Predominant Nutrition is highly recommended: Eat Plenty of vegetables, Mushrooms, fruits, Legumes, Whole Grains, Nuts, seeds in lieu of processed meats, processed snacks/pastries red meat, poultry, eggs as recommended by American College of  Lifestyle Medicine (ACLM).  -Type 2 diabetes is known to coexist with other important comorbidities such as high blood pressure and high cholesterol.  It is critical to control not only the  diabetes but also the high blood pressure and high cholesterol to minimize and delay the risk of complications including coronary artery disease, stroke, amputations, blindness, etc.  The good news is that this diet recommendation for type 2 diabetes is also very helpful for managing high cholesterol and high blood blood pressure.  - Studies showed that people with diabetes will benefit from a class of medications known as ACE inhibitors and statins.  Unless there are specific reasons not to be on these medications, the standard of care is to consider getting one from these groups of medications at an optimal doses.  These medications are generally considered safe and proven to help protect the heart and the kidneys.    - People with diabetes are encouraged to initiate and maintain regular follow-up with eye doctors, foot doctors, dentists , and if necessary heart and kidney doctors.     - It is highly recommended that people with diabetes quit smoking or stay away from smoking, and get yearly  flu vaccine and pneumonia vaccine at least every 5 years.  See above for additional recommendations on exercise, sleep, stress management , and healthy social connections.      

## 2022-02-06 ENCOUNTER — Telehealth: Payer: Self-pay | Admitting: "Endocrinology

## 2022-02-06 NOTE — Telephone Encounter (Signed)
New medication noted on med list.

## 2022-02-06 NOTE — Telephone Encounter (Signed)
New message    Patient calling back with additional information.   Patient taken Vibegron (GEMTESA) 75 MG TABS

## 2022-02-11 DIAGNOSIS — J449 Chronic obstructive pulmonary disease, unspecified: Secondary | ICD-10-CM | POA: Diagnosis not present

## 2022-02-11 DIAGNOSIS — K921 Melena: Secondary | ICD-10-CM | POA: Diagnosis not present

## 2022-02-11 DIAGNOSIS — I4891 Unspecified atrial fibrillation: Secondary | ICD-10-CM | POA: Diagnosis not present

## 2022-02-11 DIAGNOSIS — R112 Nausea with vomiting, unspecified: Secondary | ICD-10-CM | POA: Diagnosis not present

## 2022-02-12 DIAGNOSIS — E1165 Type 2 diabetes mellitus with hyperglycemia: Secondary | ICD-10-CM | POA: Diagnosis not present

## 2022-02-19 ENCOUNTER — Ambulatory Visit (INDEPENDENT_AMBULATORY_CARE_PROVIDER_SITE_OTHER): Payer: Medicare Other | Admitting: Physician Assistant

## 2022-02-19 VITALS — BP 120/87 | HR 88

## 2022-02-19 DIAGNOSIS — N200 Calculus of kidney: Secondary | ICD-10-CM

## 2022-02-19 DIAGNOSIS — N390 Urinary tract infection, site not specified: Secondary | ICD-10-CM

## 2022-02-19 DIAGNOSIS — N3281 Overactive bladder: Secondary | ICD-10-CM

## 2022-02-19 DIAGNOSIS — Z87440 Personal history of urinary (tract) infections: Secondary | ICD-10-CM | POA: Diagnosis not present

## 2022-02-19 MED ORDER — GEMTESA 75 MG PO TABS: 1.0000 | ORAL_TABLET | Freq: Every day | ORAL | 3 refills | Status: DC
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
## 2022-02-20 LAB — URINALYSIS, ROUTINE W REFLEX MICROSCOPIC
Bilirubin, UA: NEGATIVE
Ketones, UA: NEGATIVE
Leukocytes,UA: NEGATIVE
Nitrite, UA: NEGATIVE
Protein,UA: NEGATIVE
RBC, UA: NEGATIVE
Specific Gravity, UA: 1.005 (ref 1.005–1.030)
Urobilinogen, Ur: 0.2 mg/dL (ref 0.2–1.0)
pH, UA: 5.5 (ref 5.0–7.5)

## 2022-02-24 DIAGNOSIS — J9601 Acute respiratory failure with hypoxia: Secondary | ICD-10-CM | POA: Diagnosis not present

## 2022-02-24 DIAGNOSIS — I5033 Acute on chronic diastolic (congestive) heart failure: Secondary | ICD-10-CM | POA: Diagnosis not present

## 2022-02-24 DIAGNOSIS — I4891 Unspecified atrial fibrillation: Secondary | ICD-10-CM | POA: Diagnosis not present

## 2022-03-13 DIAGNOSIS — E1151 Type 2 diabetes mellitus with diabetic peripheral angiopathy without gangrene: Secondary | ICD-10-CM | POA: Diagnosis not present

## 2022-03-13 DIAGNOSIS — M79671 Pain in right foot: Secondary | ICD-10-CM | POA: Diagnosis not present

## 2022-03-13 DIAGNOSIS — E1140 Type 2 diabetes mellitus with diabetic neuropathy, unspecified: Secondary | ICD-10-CM | POA: Diagnosis not present

## 2022-03-13 DIAGNOSIS — M79672 Pain in left foot: Secondary | ICD-10-CM | POA: Diagnosis not present

## 2022-03-15 DIAGNOSIS — R600 Localized edema: Secondary | ICD-10-CM | POA: Diagnosis not present

## 2022-03-15 DIAGNOSIS — M79662 Pain in left lower leg: Secondary | ICD-10-CM | POA: Diagnosis not present

## 2022-03-15 DIAGNOSIS — R609 Edema, unspecified: Secondary | ICD-10-CM | POA: Diagnosis not present

## 2022-03-15 DIAGNOSIS — M79672 Pain in left foot: Secondary | ICD-10-CM | POA: Diagnosis not present

## 2022-03-21 DIAGNOSIS — I509 Heart failure, unspecified: Secondary | ICD-10-CM | POA: Diagnosis not present

## 2022-03-21 DIAGNOSIS — E1169 Type 2 diabetes mellitus with other specified complication: Secondary | ICD-10-CM | POA: Diagnosis not present

## 2022-03-21 DIAGNOSIS — R600 Localized edema: Secondary | ICD-10-CM | POA: Diagnosis not present

## 2022-03-27 DIAGNOSIS — I5033 Acute on chronic diastolic (congestive) heart failure: Secondary | ICD-10-CM | POA: Diagnosis not present

## 2022-03-27 DIAGNOSIS — J9601 Acute respiratory failure with hypoxia: Secondary | ICD-10-CM | POA: Diagnosis not present

## 2022-03-27 DIAGNOSIS — I4891 Unspecified atrial fibrillation: Secondary | ICD-10-CM | POA: Diagnosis not present

## 2022-04-01 DIAGNOSIS — E1165 Type 2 diabetes mellitus with hyperglycemia: Secondary | ICD-10-CM | POA: Diagnosis not present

## 2022-04-01 DIAGNOSIS — J449 Chronic obstructive pulmonary disease, unspecified: Secondary | ICD-10-CM | POA: Diagnosis not present

## 2022-04-01 DIAGNOSIS — I509 Heart failure, unspecified: Secondary | ICD-10-CM | POA: Diagnosis not present

## 2022-04-01 DIAGNOSIS — I82501 Chronic embolism and thrombosis of unspecified deep veins of right lower extremity: Secondary | ICD-10-CM | POA: Diagnosis not present

## 2022-04-01 DIAGNOSIS — E2740 Unspecified adrenocortical insufficiency: Secondary | ICD-10-CM | POA: Diagnosis not present

## 2022-04-01 DIAGNOSIS — I700 Atherosclerosis of aorta: Secondary | ICD-10-CM | POA: Diagnosis not present

## 2022-04-01 DIAGNOSIS — I4891 Unspecified atrial fibrillation: Secondary | ICD-10-CM | POA: Diagnosis not present

## 2022-04-02 DIAGNOSIS — M79672 Pain in left foot: Secondary | ICD-10-CM | POA: Diagnosis not present

## 2022-04-02 DIAGNOSIS — E1140 Type 2 diabetes mellitus with diabetic neuropathy, unspecified: Secondary | ICD-10-CM | POA: Diagnosis not present

## 2022-04-02 DIAGNOSIS — M79671 Pain in right foot: Secondary | ICD-10-CM | POA: Diagnosis not present

## 2022-04-02 DIAGNOSIS — E1151 Type 2 diabetes mellitus with diabetic peripheral angiopathy without gangrene: Secondary | ICD-10-CM | POA: Diagnosis not present

## 2022-04-25 ENCOUNTER — Encounter: Payer: Self-pay | Admitting: *Deleted

## 2022-04-25 ENCOUNTER — Ambulatory Visit: Payer: Medicare HMO | Attending: Cardiology | Admitting: Cardiology

## 2022-04-25 ENCOUNTER — Encounter: Payer: Self-pay | Admitting: Cardiology

## 2022-04-25 ENCOUNTER — Ambulatory Visit: Payer: Medicare Other | Admitting: Cardiology

## 2022-04-25 VITALS — BP 124/70 | HR 99 | Ht 63.0 in | Wt 280.0 lb

## 2022-04-25 DIAGNOSIS — I4891 Unspecified atrial fibrillation: Secondary | ICD-10-CM | POA: Diagnosis not present

## 2022-04-25 DIAGNOSIS — I2510 Atherosclerotic heart disease of native coronary artery without angina pectoris: Secondary | ICD-10-CM | POA: Diagnosis not present

## 2022-04-25 DIAGNOSIS — I10 Essential (primary) hypertension: Secondary | ICD-10-CM | POA: Diagnosis not present

## 2022-04-25 DIAGNOSIS — J449 Chronic obstructive pulmonary disease, unspecified: Secondary | ICD-10-CM | POA: Diagnosis not present
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
## 2022-04-25 NOTE — Patient Instructions (Signed)
Medication Instructions:  Continue all current medications.  Labwork: none  Testing/Procedures: none  Follow-Up: 6 months   Any Other Special Instructions Will Be Listed Below (If Applicable). You have been referred to Pulmonology    If you need a refill on your cardiac medications before your next appointment, please call your pharmacy.

## 2022-04-26 DIAGNOSIS — I4891 Unspecified atrial fibrillation: Secondary | ICD-10-CM | POA: Diagnosis not present

## 2022-04-26 DIAGNOSIS — J9601 Acute respiratory failure with hypoxia: Secondary | ICD-10-CM | POA: Diagnosis not present

## 2022-04-26 DIAGNOSIS — I5033 Acute on chronic diastolic (congestive) heart failure: Secondary | ICD-10-CM | POA: Diagnosis not present

## 2022-04-27 ENCOUNTER — Other Ambulatory Visit: Payer: Self-pay

## 2022-04-27 ENCOUNTER — Encounter (HOSPITAL_COMMUNITY): Payer: Self-pay

## 2022-04-27 ENCOUNTER — Emergency Department (HOSPITAL_COMMUNITY)
Admission: EM | Admit: 2022-04-27 | Discharge: 2022-04-27 | Disposition: A | Discharge status: Home / Self Care | Payer: Medicare HMO | Attending: Emergency Medicine | Admitting: Emergency Medicine

## 2022-04-27 DIAGNOSIS — Z7901 Long term (current) use of anticoagulants: Secondary | ICD-10-CM | POA: Insufficient documentation

## 2022-04-27 DIAGNOSIS — N939 Abnormal uterine and vaginal bleeding, unspecified: Secondary | ICD-10-CM | POA: Insufficient documentation

## 2022-04-27 DIAGNOSIS — J449 Chronic obstructive pulmonary disease, unspecified: Secondary | ICD-10-CM | POA: Diagnosis not present

## 2022-04-27 DIAGNOSIS — D649 Anemia, unspecified: Secondary | ICD-10-CM | POA: Diagnosis not present

## 2022-04-27 LAB — BASIC METABOLIC PANEL
Anion gap: 9 (ref 5–15)
BUN: 23 mg/dL (ref 8–23)
CO2: 27 mmol/L (ref 22–32)
Calcium: 9.2 mg/dL (ref 8.9–10.3)
Chloride: 102 mmol/L (ref 98–111)
Creatinine, Ser: 1.21 mg/dL — ABNORMAL HIGH (ref 0.44–1.00)
GFR, Estimated: 46 mL/min — ABNORMAL LOW (ref >60)
Glucose, Bld: 216 mg/dL — ABNORMAL HIGH (ref 70–99)
Potassium: 5.1 mmol/L (ref 3.5–5.1)
Sodium: 138 mmol/L (ref 135–145)

## 2022-04-27 LAB — CBC
HCT: 33.1 % — ABNORMAL LOW (ref 36.0–46.0)
Hemoglobin: 9.4 g/dL — ABNORMAL LOW (ref 12.0–15.0)
MCH: 21.4 pg — ABNORMAL LOW (ref 26.0–34.0)
MCHC: 28.4 g/dL — ABNORMAL LOW (ref 30.0–36.0)
MCV: 75.4 fL — ABNORMAL LOW (ref 80.0–100.0)
Platelets: 283 10*3/uL (ref 150–400)
RBC: 4.39 MIL/uL (ref 3.87–5.11)
RDW: 19.2 % — ABNORMAL HIGH (ref 11.5–15.5)
WBC: 12.1 10*3/uL — ABNORMAL HIGH (ref 4.0–10.5)
nRBC: 0 % (ref 0.0–0.2)

## 2022-04-27 MED ORDER — GUAIFENESIN-DM 100-10 MG/5ML PO SYRP
5.0000 mL | ORAL_SOLUTION | Freq: Once | ORAL | Status: AC
  Administered 2022-04-27: 5 mL via ORAL
  Filled 2022-04-27: qty 5

## 2022-04-27 NOTE — ED Triage Notes (Signed)
Pt c/o vaginal irritation and bleeding that as described as "spotting" that started this AM. 2 days ago pt started having intermittent dizziness described as the "room is spinning," and a worsening cough. Pt with hx of vertigo, COPD, and hysterectomy.

## 2022-04-27 NOTE — ED Provider Triage Note (Signed)
Emergency Medicine Provider Triage Evaluation Note  CLARECE DRZEWIECKI , a 76 y.o. female  was evaluated in triage.  Pt complains of blood after urinating.  Pt unsure if blood is in urine or vaginal   Review of Systems  Positive: Lower abdominal discomfort  Negative: Fever  Physical Exam  BP (!) 120/90 (BP Location: Right Arm)   Pulse 71   Temp 98 F (36.7 C) (Oral)   Resp 18   Ht '5\' 3"'$  (1.6 m)   Wt 127 kg   SpO2 93%   BMI 49.60 kg/m  Gen:   Awake, no distress   Resp:  Normal effort  MSK:   Moves extremities without difficulty  Other:    Medical Decision Making  Medically screening exam initiated at 4:32 PM.  Appropriate orders placed.  AASHKA SALOMONE was informed that the remainder of the evaluation will be completed by another provider, this initial triage assessment does not replace that evaluation, and the importance of remaining in the ED until their evaluation is complete.     Fransico Meadow, [place] 04/27/22 1640

## 2022-04-27 NOTE — Discharge Instructions (Signed)
Today you were seen in the emergency department for your vaginal bleeding.  You had lab work that was reassuring.  Please follow-up with Dr. Rip Harbour from OB/GYN to discuss your vaginal bleeding.  Return to the emergency department if you soak through more than 2 pads per hour for several hours, have fainting, chest pain, shortness of breath, or any other concerning symptoms.

## 2022-04-27 NOTE — ED Notes (Signed)
Before exam bp 132/107

## 2022-04-27 NOTE — ED Notes (Signed)
Put a pure wick on pt and doctor is at the beside at this time

## 2022-04-27 NOTE — ED Provider Notes (Signed)
[facility] Provider Note   CSN: [id number] Arrival date & time: 04/27/22  1517     History {Add pertinent medical, surgical, social history, OB history to HPI:1} No chief complaint on file.   Ann [name] is a 76 y.o. female.  76 year old female with a history of DVT on Eliquis, COPD, and chronic vertigo who presents to the emergency department with vaginal bleeding.  Says that for the past week she has been going through 2-3 depends pads per day.  Initially started off as light vaginal spotting but has increased recently.  Denies any lightheadedness, syncope, chest pain, or shortness of breath.  Does have chronic vertigo which she reports is unchanged.  Has a left lower extremity DVT that she is currently taking Eliquis for.  No history of gynecologic cancer and has not had a transvaginal ultrasound before.  Reports that this did happen recently but does not recall the cause.       Home Medications Prior to Admission medications   Medication Sig Start Date End Date Taking? Authorizing Provider  acetaminophen (TYLENOL) 325 MG tablet Take 2 tablets (650 mg total) by mouth every 6 (six) hours as needed for mild pain, moderate pain or fever. 12/11/19   Roxan Hockey, MD  albuterol (VENTOLIN HFA) 108 (90 Base) MCG/ACT inhaler Inhale 2 puffs into the lungs every 4 (four) hours as needed for wheezing. 05/31/20   Angiulli, Lavon Paganini, PA-C  apixaban (ELIQUIS) 5 MG TABS tablet Take 1 tablet (5 mg total) by mouth 2 (two) times daily. 05/31/20 10/17/68  Angiulli, Lavon Paganini, PA-C  atorvastatin (LIPITOR) 80 MG tablet Take 1 tablet (80 mg total) by mouth daily at 6 PM. 05/31/20   Angiulli, Lavon Paganini, PA-C  butalbital-acetaminophen-caffeine (FIORICET) 50-325-40 MG tablet Take 1 tablet by mouth 2 (two) times daily. 03/19/21   [provider]  Carboxymethylcellulose Sod PF 0.5 % SOLN Place 2 drops into both eyes daily as needed (for dry eye relief).     [provider]   Cholecalciferol (VITAMIN D3) 125 MCG (5000 UT) CAPS Take 1 capsule (5,000 Units total) by mouth daily. 05/31/20   Angiulli, Lavon Paganini, PA-C  cyanocobalamin 1000 MCG tablet Take 1 tablet (1,000 mcg total) by mouth daily. 05/31/20   Angiulli, Lavon Paganini, PA-C  diltiazem (CARDIZEM CD) 240 MG 24 hr capsule Take 1 capsule (240 mg total) by mouth daily. 06/01/20   Angiulli, Lavon Paganini, PA-C  Docusate Sodium 100 MG capsule Take 100 mg by mouth daily.     [provider]  esomeprazole (NEXIUM) 40 MG capsule Take 1 capsule (40 mg total) by mouth 2 (two) times daily before a meal. Patient not taking: Reported on 04/25/2022 10/25/21 10/25/22  Eloise Harman, DO  furosemide (LASIX) 40 MG tablet Take 1 tablet (40 mg total) by mouth 2 (two) times daily. 05/31/20   Angiulli, Lavon Paganini, PA-C  gabapentin (NEURONTIN) 400 MG capsule Take 1 capsule (400 mg total) by mouth 3 (three) times daily. 05/31/20   Angiulli, Lavon Paganini, PA-C  glipiZIDE (GLUCOTROL XL) 5 MG 24 hr tablet Take 1 tablet (5 mg total) by mouth daily with breakfast. 11/07/21   Nida, Marella Chimes, MD  HYDROcodone-acetaminophen (NORCO/VICODIN) 5-325 MG tablet Take 2 tablets by mouth every 4 (four) hours as needed. 12/13/21   Cristie Hem, MD  Insulin Lispro Prot & Lispro (HUMALOG MIX 75/25 KWIKPEN) (75-25) 100 UNIT/ML Kwikpen Inject 40 Units into the skin 2 (two) times daily before a meal. Patient  not taking: Reported on 04/25/2022 02/05/22   Cassandria Anger, MD  ipratropium-albuterol (DUONEB) 0.5-2.5 (3) MG/3ML SOLN Take 3 mLs by nebulization every 6 (six) hours as needed (copd). Patient not taking: Reported on 04/25/2022 03/17/20   [provider]  levothyroxine (SYNTHROID) 175 MCG tablet Take 1 tablet (175 mcg total) by mouth every morning. 11/16/20   Nida, Marella Chimes, MD  Menthol-Methyl Salicylate (SALONPAS PAIN RELIEF PATCH) PTCH Apply 1 patch topically daily as needed (pain).    [provider]  nitroGLYCERIN (NITROSTAT)  0.4 MG SL tablet Place 1 tablet (0.4 mg total) under the tongue every 5 (five) minutes as needed for chest pain. 10/15/12   Sinda Du, MD  ondansetron (ZOFRAN) 4 MG tablet Take 1 tablet (4 mg total) by mouth every 6 (six) hours as needed for nausea. 12/23/20   Virl Cagey, MD  predniSONE (DELTASONE) 10 MG tablet Take 1 tablet (10 mg total) by mouth daily with breakfast. 11/16/20   Cassandria Anger, MD  sertraline (ZOLOFT) 100 MG tablet Take 0.5 tablets (50 mg total) by mouth at bedtime. 05/31/20   Angiulli, Lavon Paganini, PA-C  solifenacin (VESICARE) 10 MG tablet Take 10 mg by mouth at bedtime.     [provider]  traZODone (DESYREL) 50 MG tablet Take 1 tablet (50 mg total) by mouth at bedtime as needed for sleep. 05/31/20   Angiulli, Lavon Paganini, PA-C  TRELEGY ELLIPTA 100-62.5-25 MCG/ACT AEPB Take 1 puff by mouth daily. 09/21/21   [provider]  Vibegron (GEMTESA) 75 MG TABS Take 1 capsule by mouth daily. 02/19/22   [place], Berneice Heinrich, PA-C      Allergies    Ace inhibitors, Asa [aspirin], Tape, Niacin, and Reglan [metoclopramide]    Review of Systems   Review of Systems  Physical Exam Updated Vital Signs BP (!) 120/90 (BP Location: Right Arm)   Pulse 71   Temp 98 F (36.7 C) (Oral)   Resp 18   Ht '5\' 3"'$  (1.6 m)   Wt 127 kg   SpO2 93%   BMI 49.60 kg/m  Physical Exam Vitals and nursing note reviewed.  Constitutional:      General: She is not in acute distress.    Appearance: She is well-developed.  HENT:     Head: Normocephalic and atraumatic.     Right Ear: External ear normal.     Left Ear: External ear normal.     Nose: Nose normal.  Eyes:     Extraocular Movements: Extraocular movements intact.     Conjunctiva/sclera: Conjunctivae normal.     Pupils: Pupils are equal, round, and reactive to light.  Pulmonary:     Effort: Pulmonary effort is normal. No respiratory distress.  Abdominal:     General: Abdomen is flat. There is no distension.      Palpations: Abdomen is soft. There is no mass.     Tenderness: There is no abdominal tenderness. There is no guarding.  Musculoskeletal:        General: No swelling.     Cervical back: Normal range of motion and neck supple.     Right lower leg: No edema.     Left lower leg: Edema (2+) present.  Skin:    General: Skin is warm and dry.     Capillary Refill: Capillary refill takes less than 2 seconds.  Neurological:     Mental Status: She is alert and oriented to person, place, and time. Mental status is at baseline.  Psychiatric:        Mood and Affect: Mood normal.     ED Results / Procedures / Treatments   Labs (all labs ordered are listed, but only abnormal results are displayed) Labs Reviewed  BASIC METABOLIC PANEL - Abnormal; Notable for the following components:      Result Value   Glucose, Bld 216 (*)    Creatinine, Ser 1.21 (*)    GFR, Estimated 46 (*)    All other components within normal limits  CBC - Abnormal; Notable for the following components:   WBC 12.1 (*)    Hemoglobin 9.4 (*)    HCT 33.1 (*)    MCV 75.4 (*)    MCH 21.4 (*)    MCHC 28.4 (*)    RDW 19.2 (*)    All other components within normal limits  URINALYSIS, ROUTINE W REFLEX MICROSCOPIC    EKG None  Radiology No results found.  Procedures Procedures  {Document cardiac monitor, telemetry assessment procedure when appropriate:1}  Medications Ordered in ED Medications - No data to display  ED Course/ Medical Decision Making/ A&P Clinical Course as of 04/27/22 1815  Sat Apr 27, 2022  1808 Hemoglobin(!): 9.4 At baseline [RP]    Clinical Course User Index [RP] Fransico Meadow, MD                           Medical Decision Making Amount and/or Complexity of Data Reviewed Labs: ordered. Decision-making details documented in ED Course. Radiology: ordered.   ***  {Document critical care time when appropriate:1} {Document review of labs and clinical decision tools ie heart  score, Chads2Vasc2 etc:1}  {Document your independent review of radiology images, and any outside records:1} {Document your discussion with family members, caretakers, and with consultants:1} {Document social determinants of health affecting pt's care:1} {Document your decision making why or why not admission, treatments were needed:1} Final Clinical Impression(s) / ED Diagnoses Final diagnoses:  None    Rx / DC Orders ED Discharge Orders     None

## 2022-04-27 NOTE — ED Notes (Signed)
Dr. Informed about setup

## 2022-04-29 ENCOUNTER — Ambulatory Visit (INDEPENDENT_AMBULATORY_CARE_PROVIDER_SITE_OTHER): Payer: Medicare HMO | Admitting: Internal Medicine

## 2022-04-29 ENCOUNTER — Encounter: Payer: Self-pay | Admitting: Internal Medicine

## 2022-04-29 VITALS — BP 112/72 | HR 89 | Ht 63.0 in | Wt 275.2 lb

## 2022-04-29 DIAGNOSIS — J9611 Chronic respiratory failure with hypoxia: Secondary | ICD-10-CM

## 2022-04-29 DIAGNOSIS — J449 Chronic obstructive pulmonary disease, unspecified: Secondary | ICD-10-CM

## 2022-04-29 MED ORDER — AIRSUPRA 90-80 MCG/ACT IN AERO: 1.0000 | INHALATION_SPRAY | RESPIRATORY_TRACT | 0 refills | Status: DC | PRN

## 2022-04-29 MED ORDER — BREZTRI AEROSPHERE 160-9-4.8 MCG/ACT IN AERO: 2.0000 | INHALATION_SPRAY | Freq: Two times a day (BID) | RESPIRATORY_TRACT | 6 refills | Status: DC

## 2022-04-29 MED ORDER — BREZTRI AEROSPHERE 160-9-4.8 MCG/ACT IN AERO: INHALATION_SPRAY | RESPIRATORY_TRACT | 11 refills | Status: DC

## 2022-04-29 NOTE — Patient Instructions (Signed)
Plan A = Automatic = Always=    Breztri (instead of trelegy)  Take 2 puffs first thing in am and then another 2 puffs about 12 hours later.    Work on inhaler technique:  relax and gently blow all the way out then take a nice smooth full deep breath back in, triggering the inhaler at same time you start breathing in.  Hold breath in for at least  5 seconds if you can. Blow out breztri  thru nose. Rinse and gargle with water when done.  If mouth or throat bother you at all,  try brushing teeth/gums/tongue with arm and hammer toothpaste/ make a slurry and gargle and spit out.       Plan B = Backup (to supplement plan A, not to replace it) Only use your albuterol inhaler as a rescue medication to be used if you can't catch your breath by resting or doing a relaxed purse lip breathing pattern.  - The less you use it, the better it will work when you need it. - Ok to use the inhaler up to 2 puffs  every 4 hours if you must but call for appointment if use goes up over your usual need - Don't leave home without it !!  (think of it like the spare tire for your car)   Plan C = Crisis (instead of Plan B but only if Plan B stops working) - only use your albuterol nebulizer if you first try Plan B and it fails to help > ok to use the nebulizer up to every 4 hours but if start needing it regularly call for immediate appointment    Also  Ok to try albuterol 15 min before an activity (on alternating days)  that you know would usually make you short of breath and see if it makes any difference and if makes none then don't take albuterol after activity unless you can't catch your breath as this means it's the resting that helps, not the albuterol.   Make sure you check your oxygen saturation  AT  your highest level of activity (not after you stop)   to be sure it stays over 90% and adjust  02 flow upward to maintain this level if needed but remember to turn it back to previous settings when you stop (to conserve  your supply).    Please schedule a follow up office visit in 6 weeks, call sooner if needed
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
## 2022-04-30 ENCOUNTER — Telehealth: Payer: Self-pay | Admitting: Internal Medicine

## 2022-04-30 ENCOUNTER — Encounter: Payer: Self-pay | Admitting: Internal Medicine

## 2022-04-30 DIAGNOSIS — J9611 Chronic respiratory failure with hypoxia: Secondary | ICD-10-CM | POA: Insufficient documentation

## 2022-04-30 NOTE — Telephone Encounter (Signed)
Called and notified patient. Nothing further needed at this time.

## 2022-04-30 NOTE — Assessment & Plan Note (Addendum)
As of 04/29/2022 using 02 2lpm and prn   Advised: Make sure you check your oxygen saturation  AT  your highest level of activity (not after you stop)   to be sure it stays over 90% and adjust  02 flow upward to maintain this level if needed but remember to turn it back to previous settings when you stop (to conserve your supply).   Needs ono on 2lpm  given concern of evolving PH (WHO III)   F/u 6 weeks with all meds in hand using a trust but verify approach to confirm accurate Medication  Reconciliation The principal here is that until we are certain that the  patients are doing what we've asked, it makes no sense to ask them to do more.          Each maintenance medication was reviewed in detail including emphasizing most importantly the difference between maintenance and prns and under what circumstances the prns are to be triggered using an action plan format where appropriate.  Total time for H and P, chart review, counseling, reviewing hfa/02 device(s) and generating customized AVS unique to this office visit / same day charting  > 30 min new pt eval

## 2022-04-30 NOTE — Telephone Encounter (Signed)
Let her know I ordered ONO on 2lpm to verify her 02 at hs is adequate

## 2022-04-30 NOTE — Assessment & Plan Note (Signed)
Quit smoking 1980 then gained over a hundred pounds - 04/29/2022  After extensive coaching inhaler device,  effectiveness =    75% try change trelegy to [facility] and approp saba    Group D (now reclassified as E) in terms of symptom/risk and laba/lama/ICS  therefore appropriate rx at this point >>>  breztri and prn saba  Re SABA :  I spent extra time with pt today reviewing appropriate use of albuterol for prn use on exertion with the following points: 1) saba is for relief of sob that does not improve by walking a slower pace or resting but rather if the pt does not improve after trying this first. 2) If the pt is convinced, as many are, that saba helps recover from activity faster then it's easy to tell if this is the case by re-challenging : ie stop, take the inhaler, then p 5 minutes try the exact same activity (intensity of workload) that just caused the symptoms and see if they are substantially diminished or not after saba 3) if there is an activity that reproducibly causes the symptoms, try the saba 15 min before the activity on alternate days   If in fact the saba really does help, then fine to continue to use it prn but advised may need to look closer at the maintenance regimen being used to achieve better control of airways disease with exertion.

## 2022-05-01 DIAGNOSIS — I509 Heart failure, unspecified: Secondary | ICD-10-CM | POA: Diagnosis not present

## 2022-05-01 DIAGNOSIS — E1165 Type 2 diabetes mellitus with hyperglycemia: Secondary | ICD-10-CM | POA: Diagnosis not present

## 2022-05-10 DIAGNOSIS — H5203 Hypermetropia, bilateral: Secondary | ICD-10-CM | POA: Diagnosis not present

## 2022-05-10 DIAGNOSIS — H5213 Myopia, bilateral: Secondary | ICD-10-CM | POA: Diagnosis not present

## 2022-05-10 DIAGNOSIS — H524 Presbyopia: Secondary | ICD-10-CM | POA: Diagnosis not present

## 2022-05-10 DIAGNOSIS — H52229 Regular astigmatism, unspecified eye: Secondary | ICD-10-CM | POA: Diagnosis not present

## 2022-05-10 DIAGNOSIS — H2510 Age-related nuclear cataract, unspecified eye: Secondary | ICD-10-CM | POA: Diagnosis not present

## 2022-05-13 ENCOUNTER — Telehealth: Payer: Self-pay | Admitting: "Endocrinology

## 2022-05-13 DIAGNOSIS — E1165 Type 2 diabetes mellitus with hyperglycemia: Secondary | ICD-10-CM

## 2022-05-13 NOTE — Telephone Encounter (Signed)
Pt is asking for a refill on her [employer]. [facility] on [address]

## 2022-05-13 NOTE — Telephone Encounter (Signed)
Left a message requesting pt return call to the office. ?

## 2022-05-14 MED ORDER — FREESTYLE LIBRE 2 SENSOR MISC: 0 refills | Status: DC

## 2022-05-14 NOTE — Telephone Encounter (Signed)
Spoke with pt, she stated she needed a Rx for short term to [facility] in [place] and also a Rx sent to the [facility] in [place]. Rx refills for libre 2 sensors sent to both.

## 2022-05-23 DIAGNOSIS — J9611 Chronic respiratory failure with hypoxia: Secondary | ICD-10-CM | POA: Diagnosis not present

## 2022-05-27 DIAGNOSIS — I4891 Unspecified atrial fibrillation: Secondary | ICD-10-CM | POA: Diagnosis not present

## 2022-05-27 DIAGNOSIS — I5033 Acute on chronic diastolic (congestive) heart failure: Secondary | ICD-10-CM | POA: Diagnosis not present

## 2022-05-27 DIAGNOSIS — J9601 Acute respiratory failure with hypoxia: Secondary | ICD-10-CM | POA: Diagnosis not present

## 2022-05-28 ENCOUNTER — Other Ambulatory Visit (HOSPITAL_COMMUNITY): Payer: Self-pay | Admitting: Gerontology

## 2022-05-28 ENCOUNTER — Ambulatory Visit (HOSPITAL_COMMUNITY)
Admission: RE | Admit: 2022-05-28 | Discharge: 2022-05-28 | Disposition: A | Discharge status: Home / Self Care | Payer: Medicare HMO | Source: Ambulatory Visit | Attending: Gerontology | Admitting: Gerontology

## 2022-05-28 DIAGNOSIS — R531 Weakness: Secondary | ICD-10-CM | POA: Diagnosis not present

## 2022-05-28 DIAGNOSIS — R059 Cough, unspecified: Secondary | ICD-10-CM | POA: Diagnosis not present

## 2022-05-28 DIAGNOSIS — R0602 Shortness of breath: Secondary | ICD-10-CM | POA: Diagnosis not present

## 2022-06-01 DIAGNOSIS — I509 Heart failure, unspecified: Secondary | ICD-10-CM | POA: Diagnosis not present

## 2022-06-01 DIAGNOSIS — E1165 Type 2 diabetes mellitus with hyperglycemia: Secondary | ICD-10-CM | POA: Diagnosis not present

## 2022-06-07 DIAGNOSIS — E2740 Unspecified adrenocortical insufficiency: Secondary | ICD-10-CM | POA: Diagnosis not present

## 2022-06-07 DIAGNOSIS — E1165 Type 2 diabetes mellitus with hyperglycemia: Secondary | ICD-10-CM | POA: Diagnosis not present

## 2022-06-07 DIAGNOSIS — E039 Hypothyroidism, unspecified: Secondary | ICD-10-CM | POA: Diagnosis not present

## 2022-06-08 LAB — COMPREHENSIVE METABOLIC PANEL
ALT: 12 IU/L (ref 0–32)
AST: 15 IU/L (ref 0–40)
Albumin/Globulin Ratio: 1.2 (ref 1.2–2.2)
Albumin: 3.9 g/dL (ref 3.8–4.8)
Alkaline Phosphatase: 103 IU/L (ref 44–121)
BUN/Creatinine Ratio: 13 (ref 12–28)
BUN: 13 mg/dL (ref 8–27)
Bilirubin Total: 0.3 mg/dL (ref 0.0–1.2)
CO2: 22 mmol/L (ref 20–29)
Calcium: 8.7 mg/dL (ref 8.7–10.3)
Chloride: 100 mmol/L (ref 96–106)
Creatinine, Ser: 0.98 mg/dL (ref 0.57–1.00)
Globulin, Total: 3.2 g/dL (ref 1.5–4.5)
Glucose: 203 mg/dL — ABNORMAL HIGH (ref 70–99)
Potassium: 4.3 mmol/L (ref 3.5–5.2)
Sodium: 141 mmol/L (ref 134–144)
Total Protein: 7.1 g/dL (ref 6.0–8.5)
eGFR: 60 mL/min/{1.73_m2} (ref >59)

## 2022-06-08 LAB — LIPID PANEL
Chol/HDL Ratio: 3.5 ratio (ref 0.0–4.4)
Cholesterol, Total: 109 mg/dL (ref 100–199)
HDL: 31 mg/dL — ABNORMAL LOW (ref >39)
LDL Chol Calc (NIH): 47 mg/dL (ref 0–99)
Triglycerides: 191 mg/dL — ABNORMAL HIGH (ref 0–149)
VLDL Cholesterol Cal: 31 mg/dL (ref 5–40)

## 2022-06-08 LAB — VITAMIN D 25 HYDROXY (VIT D DEFICIENCY, FRACTURES): Vit D, 25-Hydroxy: 40.5 ng/mL (ref 30.0–100.0)

## 2022-06-08 LAB — T4, FREE: Free T4: 1.37 ng/dL (ref 0.82–1.77)

## 2022-06-08 LAB — TSH: TSH: 4.92 u[IU]/mL — ABNORMAL HIGH (ref 0.450–4.500)

## 2022-06-10 ENCOUNTER — Ambulatory Visit: Payer: Medicare Other | Admitting: "Endocrinology

## 2022-06-10 ENCOUNTER — Encounter: Payer: Self-pay | Admitting: Internal Medicine

## 2022-06-12 ENCOUNTER — Ambulatory Visit: Payer: Medicaid - Dental | Admitting: Internal Medicine
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
## 2022-06-24 DIAGNOSIS — L03116 Cellulitis of left lower limb: Secondary | ICD-10-CM | POA: Diagnosis not present

## 2022-06-24 DIAGNOSIS — E1165 Type 2 diabetes mellitus with hyperglycemia: Secondary | ICD-10-CM | POA: Diagnosis not present

## 2022-06-24 DIAGNOSIS — J449 Chronic obstructive pulmonary disease, unspecified: Secondary | ICD-10-CM | POA: Diagnosis not present

## 2022-06-24 DIAGNOSIS — I509 Heart failure, unspecified: Secondary | ICD-10-CM | POA: Diagnosis not present

## 2022-06-27 DIAGNOSIS — I5033 Acute on chronic diastolic (congestive) heart failure: Secondary | ICD-10-CM | POA: Diagnosis not present

## 2022-06-27 DIAGNOSIS — J9601 Acute respiratory failure with hypoxia: Secondary | ICD-10-CM | POA: Diagnosis not present

## 2022-06-27 DIAGNOSIS — I4891 Unspecified atrial fibrillation: Secondary | ICD-10-CM | POA: Diagnosis not present

## 2022-07-23 DIAGNOSIS — M79641 Pain in right hand: Secondary | ICD-10-CM | POA: Diagnosis not present

## 2022-07-23 DIAGNOSIS — M19041 Primary osteoarthritis, right hand: Secondary | ICD-10-CM | POA: Diagnosis not present

## 2022-07-25 DIAGNOSIS — E1165 Type 2 diabetes mellitus with hyperglycemia: Secondary | ICD-10-CM | POA: Diagnosis not present

## 2022-07-25 DIAGNOSIS — I509 Heart failure, unspecified: Secondary | ICD-10-CM | POA: Diagnosis not present

## 2022-07-26 ENCOUNTER — Ambulatory Visit: Payer: Medicaid - Dental | Admitting: Internal Medicine

## 2022-07-26 DIAGNOSIS — I4891 Unspecified atrial fibrillation: Secondary | ICD-10-CM | POA: Diagnosis not present

## 2022-07-26 DIAGNOSIS — J9601 Acute respiratory failure with hypoxia: Secondary | ICD-10-CM | POA: Diagnosis not present

## 2022-07-26 DIAGNOSIS — I5033 Acute on chronic diastolic (congestive) heart failure: Secondary | ICD-10-CM | POA: Diagnosis not present
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
## 2022-08-01 ENCOUNTER — Encounter: Payer: Self-pay | Admitting: "Endocrinology

## 2022-08-01 ENCOUNTER — Ambulatory Visit (INDEPENDENT_AMBULATORY_CARE_PROVIDER_SITE_OTHER): Payer: Medicare HMO | Admitting: "Endocrinology

## 2022-08-01 VITALS — BP 122/68 | HR 72 | Ht 63.0 in | Wt 281.0 lb

## 2022-08-01 DIAGNOSIS — E039 Hypothyroidism, unspecified: Secondary | ICD-10-CM

## 2022-08-01 DIAGNOSIS — E1165 Type 2 diabetes mellitus with hyperglycemia: Secondary | ICD-10-CM

## 2022-08-01 DIAGNOSIS — I10 Essential (primary) hypertension: Secondary | ICD-10-CM | POA: Diagnosis not present

## 2022-08-01 DIAGNOSIS — E782 Mixed hyperlipidemia: Secondary | ICD-10-CM

## 2022-08-01 DIAGNOSIS — E2740 Unspecified adrenocortical insufficiency: Secondary | ICD-10-CM

## 2022-08-01 LAB — POCT GLYCOSYLATED HEMOGLOBIN (HGB A1C): HbA1c, POC (controlled diabetic range): 7.8 % — AB (ref 0.0–7.0)

## 2022-08-01 MED ORDER — INSULIN LISPRO PROT & LISPRO (75-25 MIX) 100 UNIT/ML KWIKPEN: 40.0000 [IU] | PEN_INJECTOR | Freq: Two times a day (BID) | SUBCUTANEOUS | 2 refills | Status: DC

## 2022-08-01 MED ORDER — FREESTYLE LIBRE 2 SENSOR MISC: 2 refills | Status: DC
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
## 2022-08-01 NOTE — Patient Instructions (Signed)

## 2022-08-19 ENCOUNTER — Encounter: Payer: Self-pay | Admitting: Urology

## 2022-08-19 ENCOUNTER — Ambulatory Visit (INDEPENDENT_AMBULATORY_CARE_PROVIDER_SITE_OTHER): Payer: Medicare HMO | Admitting: Urology

## 2022-08-19 VITALS — BP 104/75 | HR 84

## 2022-08-19 DIAGNOSIS — N390 Urinary tract infection, site not specified: Secondary | ICD-10-CM

## 2022-08-19 DIAGNOSIS — Z87440 Personal history of urinary (tract) infections: Secondary | ICD-10-CM

## 2022-08-19 DIAGNOSIS — N3281 Overactive bladder: Secondary | ICD-10-CM

## 2022-08-19 LAB — MICROSCOPIC EXAMINATION: WBC, UA: >30 /hpf — AB (ref 0–5)

## 2022-08-19 LAB — URINALYSIS, ROUTINE W REFLEX MICROSCOPIC
Bilirubin, UA: NEGATIVE
Glucose, UA: NEGATIVE
Ketones, UA: NEGATIVE
Nitrite, UA: POSITIVE — AB
Protein,UA: NEGATIVE
Specific Gravity, UA: 1.015 (ref 1.005–1.030)
Urobilinogen, Ur: 0.2 mg/dL (ref 0.2–1.0)
pH, UA: 6.5 (ref 5.0–7.5)

## 2022-08-19 MED ORDER — GEMTESA 75 MG PO TABS: 75.0000 mg | ORAL_TABLET | Freq: Every day | ORAL | 3 refills | Status: DC
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
## 2022-08-19 NOTE — Patient Instructions (Signed)
Overactive Bladder, Adult  Overactive bladder is a condition in which a person has a sudden and frequent need to urinate. A person might also leak urine if he or she cannot get to the bathroom fast enough (urinary incontinence). Sometimes, symptoms can interfere with work or social activities. What are the causes? Overactive bladder is associated with poor nerve signals between your bladder and your brain. Your bladder may get the signal to empty before it is full. You may also have very sensitive muscles that make your bladder squeeze too soon. This condition may also be caused by other factors, such as: Medical conditions: Urinary tract infection. Infection of nearby tissues. Prostate enlargement. Bladder stones, inflammation, or tumors. Diabetes. Muscle or nerve weakness, especially from these conditions: A spinal cord injury. Stroke. Multiple sclerosis. Parkinson's disease. Other causes: Surgery on the uterus or urethra. Drinking too much caffeine or alcohol. Certain medicines, especially those that eliminate extra fluid in the body (diuretics). Constipation. What increases the risk? You may be at greater risk for overactive bladder if you: Are an older adult. Smoke. Are going through menopause. Have prostate problems. Have a neurological disease, such as stroke, dementia, Parkinson's disease, or multiple sclerosis (MS). Eat or drink alcohol, spicy food, caffeine, and other things that irritate the bladder. Are overweight or obese. What are the signs or symptoms? Symptoms of this condition include a sudden, strong urge to urinate. Other symptoms include: Leaking urine. Urinating 8 or more times a day. Waking up to urinate 2 or more times overnight. How is this diagnosed? This condition may be diagnosed based on: Your symptoms and medical history. A physical exam. Blood or urine tests to check for possible causes, such as infection. You may also need to see a health care  provider who specializes in urinary tract problems. This is called a urologist. How is this treated? Treatment for overactive bladder depends on the cause of your condition and whether it is mild or severe. Treatment may include: Bladder training, such as: Learning to control the urge to urinate by following a schedule to urinate at regular intervals. Doing Kegel exercises to strengthen the pelvic floor muscles that support your bladder. Special devices, such as: Biofeedback. This uses sensors to help you become aware of your body's signals. Electrical stimulation. This uses electrodes placed inside the body (implanted) or outside the body. These electrodes send gentle pulses of electricity to strengthen the nerves or muscles that control the bladder. Women may use a plastic device, called a pessary, that fits into the vagina and supports the bladder. Medicines, such as: Antibiotics to treat bladder infection. Antispasmodics to stop the bladder from releasing urine at the wrong time. Tricyclic antidepressants to relax bladder muscles. Injections of botulinum toxin type A directly into the bladder tissue to relax bladder muscles. Surgery, such as: A device may be implanted to help manage the nerve signals that control urination. An electrode may be implanted to stimulate electrical signals in the bladder. A procedure may be done to change the shape of the bladder. This is done only in very severe cases. Follow these instructions at home: Eating and drinking  Make diet or lifestyle changes recommended by your health care provider. These may include: Drinking fluids throughout the day and not only with meals. Cutting down on caffeine or alcohol. Eating a healthy and balanced diet to prevent constipation. This may include: Choosing foods that are high in fiber, such as beans, whole grains, and fresh fruits and vegetables. Limiting foods that   are high in fat and processed sugars, such as fried  and sweet foods. Lifestyle  Lose weight if needed. Do not use any products that contain nicotine or tobacco. These include cigarettes, chewing tobacco, and vaping devices, such as e-cigarettes. If you need help quitting, ask your health care provider. General instructions Take over-the-counter and prescription medicines only as told by your health care provider. If you were prescribed an antibiotic medicine, take it as told by your health care provider. Do not stop taking the antibiotic even if you start to feel better. Use any implants or pessary as told by your health care provider. If needed, wear pads to absorb urine leakage. Keep a log to track how much and when you drink, and when you need to urinate. This will help your health care provider monitor your condition. Keep all follow-up visits. This is important. Contact a health care provider if: You have a fever or chills. Your symptoms do not get better with treatment. Your pain and discomfort get worse. You have more frequent urges to urinate. Get help right away if: You are not able to control your bladder. Summary Overactive bladder refers to a condition in which a person has a sudden and frequent need to urinate. Several conditions may lead to an overactive bladder. Treatment for overactive bladder depends on the cause and severity of your condition. Making lifestyle changes, doing Kegel exercises, keeping a log, and taking medicines can help with this condition. This information is not intended to replace advice given to you by your health care provider. Make sure you discuss any questions you have with your health care provider. Document Revised: 01/31/2020 Document Reviewed: 01/31/2020 Elsevier Patient Education  2023 [employer] Inc.  

## 2022-08-20 DIAGNOSIS — M180 Bilateral primary osteoarthritis of first carpometacarpal joints: Secondary | ICD-10-CM | POA: Diagnosis not present

## 2022-08-21 ENCOUNTER — Other Ambulatory Visit: Payer: Self-pay

## 2022-08-21 DIAGNOSIS — E1165 Type 2 diabetes mellitus with hyperglycemia: Secondary | ICD-10-CM

## 2022-08-21 MED ORDER — INSULIN LISPRO PROT & LISPRO (75-25 MIX) 100 UNIT/ML KWIKPEN: 40.0000 [IU] | PEN_INJECTOR | Freq: Two times a day (BID) | SUBCUTANEOUS | 2 refills | Status: DC

## 2022-08-22 ENCOUNTER — Telehealth: Payer: Self-pay

## 2022-08-22 DIAGNOSIS — N390 Urinary tract infection, site not specified: Secondary | ICD-10-CM

## 2022-08-22 LAB — URINE CULTURE

## 2022-08-22 MED ORDER — NITROFURANTOIN MONOHYD MACRO 100 MG PO CAPS: 100.0000 mg | ORAL_CAPSULE | Freq: Two times a day (BID) | ORAL | 0 refills | Status: DC

## 2022-08-22 NOTE — Telephone Encounter (Signed)
Rx sent. Patient called and made aware. 

## 2022-08-23 DIAGNOSIS — E1165 Type 2 diabetes mellitus with hyperglycemia: Secondary | ICD-10-CM | POA: Diagnosis not present

## 2022-08-23 DIAGNOSIS — I509 Heart failure, unspecified: Secondary | ICD-10-CM | POA: Diagnosis not present

## 2022-08-26 DIAGNOSIS — I5033 Acute on chronic diastolic (congestive) heart failure: Secondary | ICD-10-CM | POA: Diagnosis not present

## 2022-08-26 DIAGNOSIS — I4891 Unspecified atrial fibrillation: Secondary | ICD-10-CM | POA: Diagnosis not present

## 2022-08-26 DIAGNOSIS — J9601 Acute respiratory failure with hypoxia: Secondary | ICD-10-CM | POA: Diagnosis not present

## 2022-08-29 ENCOUNTER — Encounter: Payer: Self-pay | Admitting: Internal Medicine

## 2022-09-05 ENCOUNTER — Telehealth: Payer: Self-pay | Admitting: Cardiology

## 2022-09-05 NOTE — Telephone Encounter (Signed)
Pt c/o of Chest Pain: STAT if CP now or developed within 24 hours  1. Are you having CP right now? No   2. Are you experiencing any other symptoms (ex. SOB, nausea, vomiting, sweating)? SOB  3. How long have you been experiencing CP? Started last night   4. Is your CP continuous or coming and going? Coming and going   5. Have you taken Nitroglycerin? Yes, took one today  ?   Transferring to RN due to being STAT.

## 2022-09-05 NOTE — Telephone Encounter (Signed)
Reports SOB started last night. Says she took her oxygen off to use the bathroom, and she could hardly breath when she got back to sit down. Reports she is SOB now and is using O2 @2  L via [place]. Does not check oxygen levels at home. Reports chest pain this morning, taking nitroglycerin x's 1 and chest pain resolved. Denies active chest pain. Denies dizziness, cough, congestion, fever. Reports swelling in left leg from cellulitis. Offered appointment to see Philis Nettle on 09/12/2022 but patient declined due to conflict with another appointment. Gave next available to see Philis Nettle on 09/16/2022 @2 :30 pm. Encouraged to contact pulmonologist as well. Advised to keep visit with pulmonologist on 09/09/2022 and if symptoms get worse, to go to the ED for an evaluation. Verbalized understanding of plan.

## 2022-09-05 NOTE — Telephone Encounter (Signed)
Agree with assessment and plan ? ?J Hymie Gorr MD ?
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
## 2022-09-09 ENCOUNTER — Ambulatory Visit (INDEPENDENT_AMBULATORY_CARE_PROVIDER_SITE_OTHER): Payer: Medicare HMO | Admitting: Internal Medicine

## 2022-09-09 ENCOUNTER — Ambulatory Visit (HOSPITAL_COMMUNITY)
Admission: RE | Admit: 2022-09-09 | Discharge: 2022-09-09 | Disposition: A | Discharge status: Home / Self Care | Payer: Medicare HMO | Source: Ambulatory Visit | Attending: Internal Medicine | Admitting: Internal Medicine

## 2022-09-09 ENCOUNTER — Encounter: Payer: Self-pay | Admitting: Internal Medicine

## 2022-09-09 VITALS — BP 148/75 | HR 73 | Ht 63.0 in | Wt 285.2 lb

## 2022-09-09 DIAGNOSIS — R0600 Dyspnea, unspecified: Secondary | ICD-10-CM | POA: Diagnosis not present

## 2022-09-09 DIAGNOSIS — D509 Iron deficiency anemia, unspecified: Secondary | ICD-10-CM | POA: Diagnosis not present

## 2022-09-09 DIAGNOSIS — R0609 Other forms of dyspnea: Secondary | ICD-10-CM

## 2022-09-09 DIAGNOSIS — J9611 Chronic respiratory failure with hypoxia: Secondary | ICD-10-CM | POA: Diagnosis not present

## 2022-09-09 DIAGNOSIS — J449 Chronic obstructive pulmonary disease, unspecified: Secondary | ICD-10-CM | POA: Diagnosis not present

## 2022-09-09 MED ORDER — BUDESONIDE-FORMOTEROL FUMARATE 80-4.5 MCG/ACT IN AERO: INHALATION_SPRAY | RESPIRATORY_TRACT | 12 refills | Status: DC

## 2022-09-09 NOTE — Patient Instructions (Addendum)
New Plan A = Automatic = Always=   Symbicort 80 Take 2 puffs first thing in am and then another 2 puffs about 12 hours later.   Work on inhaler technique:  relax and gently blow all the way out then take a nice smooth full deep breath back in, triggering the inhaler at same time you start breathing in.  Hold breath in for at least  5 seconds if you can. Blow out symbicort 80  thru nose. Rinse and gargle with water when done.  If mouth or throat bother you at all,  try brushing teeth/gums/tongue with arm and hammer toothpaste/ make a slurry and gargle and spit out.     Plan B = Backup (to supplement plan A, not to replace it) Only use your albuterol inhaler as a rescue medication to be used if you can't catch your breath by resting or doing a relaxed purse lip breathing pattern.  - The less you use it, the better it will work when you need it. - Ok to use the inhaler up to 2 puffs  every 4 hours if you must but call for appointment if use goes up over your usual need - Don't leave home without it !!  (think of it like the spare tire for your car)   Plan C = Crisis (instead of Plan B but only if Plan B stops working) - only use your albuterol nebulizer if you first try Plan B and it fails to help > ok to use the nebulizer up to every 4 hours but if start needing it regularly call for immediate appointment  Make sure you check your oxygen saturation  AT  your highest level of activity (not after you stop)   to be sure it stays over 90% and adjust  02 flow upward to maintain this level if needed but remember to turn it back to previous settings when you stop (to conserve your supply).     We will call adapt to straighten out your problem with your 02 tanks refills     Please remember to go to the lab department   for your tests - we will call you with the results when they are available.      Please remember to go to the lab and x-ray department at [facility]   for your tests - we will  call you with the results when they are available.   Please schedule a follow up visit in 3 months but call sooner if needed

## 2022-09-10 LAB — CBC WITH DIFFERENTIAL/PLATELET
Basophils Absolute: 0 10*3/uL (ref 0.0–0.2)
Basos: 0 %
EOS (ABSOLUTE): 0.2 10*3/uL (ref 0.0–0.4)
Eos: 2 %
Hematocrit: 31.7 % — ABNORMAL LOW (ref 34.0–46.6)
Hemoglobin: 9.2 g/dL — ABNORMAL LOW (ref 11.1–15.9)
Immature Grans (Abs): 0 10*3/uL (ref 0.0–0.1)
Immature Granulocytes: 0 %
Lymphocytes Absolute: 1.4 10*3/uL (ref 0.7–3.1)
Lymphs: 14 %
MCH: 21.1 pg — ABNORMAL LOW (ref 26.6–33.0)
MCHC: 29 g/dL — ABNORMAL LOW (ref 31.5–35.7)
MCV: 73 fL — ABNORMAL LOW (ref 79–97)
Monocytes Absolute: 0.7 10*3/uL (ref 0.1–0.9)
Monocytes: 7 %
Neutrophils Absolute: 8 10*3/uL — ABNORMAL HIGH (ref 1.4–7.0)
Neutrophils: 77 %
Platelets: 236 10*3/uL (ref 150–450)
RBC: 4.36 x10E6/uL (ref 3.77–5.28)
RDW: 18.3 % — ABNORMAL HIGH (ref 11.7–15.4)
WBC: 10.3 10*3/uL (ref 3.4–10.8)

## 2022-09-10 LAB — BRAIN NATRIURETIC PEPTIDE: BNP: 475.4 pg/mL — ABNORMAL HIGH (ref 0.0–100.0)

## 2022-09-10 LAB — BASIC METABOLIC PANEL
BUN/Creatinine Ratio: 16 (ref 12–28)
BUN: 13 mg/dL (ref 8–27)
CO2: 23 mmol/L (ref 20–29)
Calcium: 9.5 mg/dL (ref 8.7–10.3)
Chloride: 103 mmol/L (ref 96–106)
Creatinine, Ser: 0.81 mg/dL (ref 0.57–1.00)
Glucose: 152 mg/dL — ABNORMAL HIGH (ref 70–99)
Potassium: 4.5 mmol/L (ref 3.5–5.2)
Sodium: 141 mmol/L (ref 134–144)
eGFR: 75 mL/min/{1.73_m2} (ref >59)

## 2022-09-10 LAB — TSH: TSH: 4.26 u[IU]/mL (ref 0.450–4.500)

## 2022-09-11 ENCOUNTER — Encounter: Payer: Self-pay | Admitting: Internal Medicine

## 2022-09-11 ENCOUNTER — Other Ambulatory Visit: Payer: Self-pay

## 2022-09-11 DIAGNOSIS — R0609 Other forms of dyspnea: Secondary | ICD-10-CM

## 2022-09-11 NOTE — Assessment & Plan Note (Addendum)
Symptoms are   disproportionate to objective findings and not clear to what extent this is actually a pulmonary  problem but pt does appear to have difficult to sort out respiratory symptoms of unknown origin for which  DDX  = almost all start with A and  include Adherence, Ace Inhibitors, Acid Reflux, Active Sinus Disease, Alpha 1 Antitripsin deficiency, Anxiety masquerading as Airways dz,  ABPA,  Allergy(esp in young), Aspiration (esp in elderly), Adverse effects of meds,  Active smoking or Vaping, A bunch of PE's/clot burden (a few small clots can't cause this syndrome unless there is already severe underlying pulm or vascular dz with poor reserve),  Anemia or thyroid disorder, plus two Bs  = Bronchiectasis and Beta blocker use..and one C= CHF    Adherence: see hfa training  >> return with all meds in hand using a trust but verify approach to confirm accurate Medication  Reconciliation The principal here is that until we are certain that the  patients are doing what we've asked, it makes no sense to ask them to do more.   Allergy/asthma > symbicort 80 adequate for now plus approp saba  Anemia/ micocytic > will need eval   CHF > recheck echo

## 2022-09-11 NOTE — Assessment & Plan Note (Addendum)
Body mass index is 50.52 kg/m.  -  trending up still  Lab Results  Component Value Date   TSH 4.260 09/09/2022      Contributing to doe and risk of GERD >>>   reviewed the need and the process to achieve and maintain neg calorie balance > defer f/u primary care including intermittently monitoring thyroid status             Each maintenance medication was reviewed in detail including emphasizing most importantly the difference between maintenance and prns and under what circumstances the prns are to be triggered using an action plan format where appropriate.  Total time for H and P, chart review, counseling, reviewing hfa/neb/02  device(s) and generating customized AVS unique to this office visit / same day charting = 32 min

## 2022-09-11 NOTE — Assessment & Plan Note (Addendum)
Quit smoking 1980 then gained over a hundred pounds - PFT's  12/04/16  FEV1 1.91 (85 % ) ratio 0.79  p 13 % improvement from saba p 0 prior to study with DLCO  12.14 (50%)   and FV curve min concave  and ERV 32% at wt 174    - 04/29/2022  After extensive coaching inhaler device,  effectiveness =    75% try change change to symb 80 2bid   She has minimal airflow obst but also at risk now of cardiac asthma so no change in rx for now   Reviewed approp saba Re SABA :  I spent extra time with pt today reviewing appropriate use of albuterol for prn use on exertion with the following points: 1) saba is for relief of sob that does not improve by walking a slower pace or resting but rather if the pt does not improve after trying this first. 2) If the pt is convinced, as many are, that saba helps recover from activity faster then it's easy to tell if this is the case by re-challenging : ie stop, take the inhaler, then p 5 minutes try the exact same activity (intensity of workload) that just caused the symptoms and see if they are substantially diminished or not after saba 3) if there is an activity that reproducibly causes the symptoms, try the saba 15 min before the activity on alternate days   If in fact the saba really does help, then fine to continue to use it prn but advised may need to look closer at the maintenance regimen being used to achieve better control of airways disease with exertion.

## 2022-09-11 NOTE — Assessment & Plan Note (Signed)
  Lab Results  Component Value Date   HGB 9.2 (L) 09/09/2022   HGB 9.4 (L) 04/27/2022   HGB 9.1 (L) 12/13/2021   HGB 11.1 (L) 12/23/2020     Will likely need GI w/u if not already addressed by GI/ PCP / advised

## 2022-09-11 NOTE — Assessment & Plan Note (Signed)
As of 04/29/2022 using 02 2lpm and prn  - 05/23/22  ONO on "LPM" desats only in first hour so no change other than to wear it properly q HS   Advised: Make sure you check your oxygen saturation  AT  your highest level of activity (not after you stop)   to be sure it stays over 90% and adjust  02 flow upward to maintain this level if needed but remember to turn it back to previous settings when you stop (to conserve your supply).

## 2022-09-12 ENCOUNTER — Ambulatory Visit: Payer: Medicaid - Dental | Admitting: Nurse Practitioner

## 2022-09-12 DIAGNOSIS — E1142 Type 2 diabetes mellitus with diabetic polyneuropathy: Secondary | ICD-10-CM | POA: Diagnosis not present

## 2022-09-12 DIAGNOSIS — L03116 Cellulitis of left lower limb: Secondary | ICD-10-CM | POA: Diagnosis not present

## 2022-09-12 DIAGNOSIS — L84 Corns and callosities: Secondary | ICD-10-CM | POA: Diagnosis not present

## 2022-09-12 DIAGNOSIS — B353 Tinea pedis: Secondary | ICD-10-CM | POA: Diagnosis not present

## 2022-09-12 DIAGNOSIS — L603 Nail dystrophy: Secondary | ICD-10-CM | POA: Diagnosis not present

## 2022-09-16 ENCOUNTER — Encounter (HOSPITAL_COMMUNITY): Payer: Self-pay

## 2022-09-16 ENCOUNTER — Inpatient Hospital Stay (HOSPITAL_COMMUNITY)
Admission: EM | Admit: 2022-09-16 | Discharge: 2022-09-23 | DRG: 291 | Disposition: A | Discharge status: Home Health | Payer: Medicare HMO | Source: Ambulatory Visit | Attending: Family Medicine | Admitting: Family Medicine

## 2022-09-16 ENCOUNTER — Ambulatory Visit: Payer: Medicare HMO | Attending: Nurse Practitioner | Admitting: Nurse Practitioner

## 2022-09-16 ENCOUNTER — Emergency Department (HOSPITAL_COMMUNITY): Payer: Medicare HMO

## 2022-09-16 ENCOUNTER — Other Ambulatory Visit: Payer: Self-pay

## 2022-09-16 ENCOUNTER — Encounter: Payer: Self-pay | Admitting: Nurse Practitioner

## 2022-09-16 VITALS — BP 110/70 | HR 52 | Ht 63.0 in | Wt 290.0 lb

## 2022-09-16 DIAGNOSIS — E039 Hypothyroidism, unspecified: Secondary | ICD-10-CM | POA: Diagnosis present

## 2022-09-16 DIAGNOSIS — J441 Chronic obstructive pulmonary disease with (acute) exacerbation: Secondary | ICD-10-CM | POA: Diagnosis not present

## 2022-09-16 DIAGNOSIS — I2510 Atherosclerotic heart disease of native coronary artery without angina pectoris: Secondary | ICD-10-CM | POA: Diagnosis present

## 2022-09-16 DIAGNOSIS — Z8249 Family history of ischemic heart disease and other diseases of the circulatory system: Secondary | ICD-10-CM

## 2022-09-16 DIAGNOSIS — Z7951 Long term (current) use of inhaled steroids: Secondary | ICD-10-CM

## 2022-09-16 DIAGNOSIS — I110 Hypertensive heart disease with heart failure: Secondary | ICD-10-CM | POA: Diagnosis not present

## 2022-09-16 DIAGNOSIS — I10 Essential (primary) hypertension: Secondary | ICD-10-CM | POA: Diagnosis not present

## 2022-09-16 DIAGNOSIS — E782 Mixed hyperlipidemia: Secondary | ICD-10-CM | POA: Diagnosis present

## 2022-09-16 DIAGNOSIS — D509 Iron deficiency anemia, unspecified: Secondary | ICD-10-CM | POA: Diagnosis present

## 2022-09-16 DIAGNOSIS — E1165 Type 2 diabetes mellitus with hyperglycemia: Secondary | ICD-10-CM

## 2022-09-16 DIAGNOSIS — M549 Dorsalgia, unspecified: Secondary | ICD-10-CM | POA: Diagnosis present

## 2022-09-16 DIAGNOSIS — L03116 Cellulitis of left lower limb: Secondary | ICD-10-CM | POA: Diagnosis present

## 2022-09-16 DIAGNOSIS — Z7952 Long term (current) use of systemic steroids: Secondary | ICD-10-CM

## 2022-09-16 DIAGNOSIS — Z800 Family history of malignant neoplasm of digestive organs: Secondary | ICD-10-CM

## 2022-09-16 DIAGNOSIS — I130 Hypertensive heart and chronic kidney disease with heart failure and stage 1 through stage 4 chronic kidney disease, or unspecified chronic kidney disease: Secondary | ICD-10-CM | POA: Diagnosis not present

## 2022-09-16 DIAGNOSIS — J9611 Chronic respiratory failure with hypoxia: Secondary | ICD-10-CM | POA: Diagnosis present

## 2022-09-16 DIAGNOSIS — Z79890 Hormone replacement therapy (postmenopausal): Secondary | ICD-10-CM

## 2022-09-16 DIAGNOSIS — R0789 Other chest pain: Secondary | ICD-10-CM | POA: Diagnosis not present

## 2022-09-16 DIAGNOSIS — E2740 Unspecified adrenocortical insufficiency: Secondary | ICD-10-CM | POA: Diagnosis present

## 2022-09-16 DIAGNOSIS — E1122 Type 2 diabetes mellitus with diabetic chronic kidney disease: Secondary | ICD-10-CM | POA: Diagnosis present

## 2022-09-16 DIAGNOSIS — Z955 Presence of coronary angioplasty implant and graft: Secondary | ICD-10-CM

## 2022-09-16 DIAGNOSIS — E785 Hyperlipidemia, unspecified: Secondary | ICD-10-CM

## 2022-09-16 DIAGNOSIS — E1142 Type 2 diabetes mellitus with diabetic polyneuropathy: Secondary | ICD-10-CM | POA: Diagnosis present

## 2022-09-16 DIAGNOSIS — Z886 Allergy status to analgesic agent status: Secondary | ICD-10-CM

## 2022-09-16 DIAGNOSIS — Z801 Family history of malignant neoplasm of trachea, bronchus and lung: Secondary | ICD-10-CM

## 2022-09-16 DIAGNOSIS — M7989 Other specified soft tissue disorders: Secondary | ICD-10-CM | POA: Diagnosis not present

## 2022-09-16 DIAGNOSIS — Z66 Do not resuscitate: Secondary | ICD-10-CM | POA: Diagnosis present

## 2022-09-16 DIAGNOSIS — Z7984 Long term (current) use of oral hypoglycemic drugs: Secondary | ICD-10-CM

## 2022-09-16 DIAGNOSIS — I5033 Acute on chronic diastolic (congestive) heart failure: Principal | ICD-10-CM | POA: Diagnosis present

## 2022-09-16 DIAGNOSIS — I25118 Atherosclerotic heart disease of native coronary artery with other forms of angina pectoris: Secondary | ICD-10-CM | POA: Diagnosis present

## 2022-09-16 DIAGNOSIS — G8929 Other chronic pain: Secondary | ICD-10-CM | POA: Diagnosis present

## 2022-09-16 DIAGNOSIS — R0602 Shortness of breath: Secondary | ICD-10-CM | POA: Diagnosis not present

## 2022-09-16 DIAGNOSIS — R7989 Other specified abnormal findings of blood chemistry: Secondary | ICD-10-CM | POA: Diagnosis present

## 2022-09-16 DIAGNOSIS — Z87891 Personal history of nicotine dependence: Secondary | ICD-10-CM

## 2022-09-16 DIAGNOSIS — M1990 Unspecified osteoarthritis, unspecified site: Secondary | ICD-10-CM | POA: Diagnosis present

## 2022-09-16 DIAGNOSIS — I480 Paroxysmal atrial fibrillation: Secondary | ICD-10-CM

## 2022-09-16 DIAGNOSIS — Z7901 Long term (current) use of anticoagulants: Secondary | ICD-10-CM

## 2022-09-16 DIAGNOSIS — Z888 Allergy status to other drugs, medicaments and biological substances status: Secondary | ICD-10-CM

## 2022-09-16 DIAGNOSIS — Z9981 Dependence on supplemental oxygen: Secondary | ICD-10-CM

## 2022-09-16 DIAGNOSIS — I509 Heart failure, unspecified: Secondary | ICD-10-CM | POA: Diagnosis not present

## 2022-09-16 DIAGNOSIS — I482 Chronic atrial fibrillation, unspecified: Secondary | ICD-10-CM | POA: Diagnosis present

## 2022-09-16 DIAGNOSIS — J449 Chronic obstructive pulmonary disease, unspecified: Secondary | ICD-10-CM | POA: Diagnosis present

## 2022-09-16 DIAGNOSIS — H409 Unspecified glaucoma: Secondary | ICD-10-CM | POA: Diagnosis present

## 2022-09-16 DIAGNOSIS — Z794 Long term (current) use of insulin: Secondary | ICD-10-CM

## 2022-09-16 DIAGNOSIS — E876 Hypokalemia: Secondary | ICD-10-CM | POA: Diagnosis not present

## 2022-09-16 DIAGNOSIS — Z6841 Body Mass Index (BMI) 40.0 and over, adult: Secondary | ICD-10-CM

## 2022-09-16 DIAGNOSIS — Z86718 Personal history of other venous thrombosis and embolism: Secondary | ICD-10-CM

## 2022-09-16 DIAGNOSIS — I4819 Other persistent atrial fibrillation: Secondary | ICD-10-CM | POA: Diagnosis present

## 2022-09-16 DIAGNOSIS — K219 Gastro-esophageal reflux disease without esophagitis: Secondary | ICD-10-CM | POA: Diagnosis present

## 2022-09-16 DIAGNOSIS — Z79899 Other long term (current) drug therapy: Secondary | ICD-10-CM

## 2022-09-16 DIAGNOSIS — I2781 Cor pulmonale (chronic): Secondary | ICD-10-CM | POA: Diagnosis present

## 2022-09-16 LAB — CBC
HCT: 32.6 % — ABNORMAL LOW (ref 36.0–46.0)
Hemoglobin: 9.2 g/dL — ABNORMAL LOW (ref 12.0–15.0)
MCH: 21.3 pg — ABNORMAL LOW (ref 26.0–34.0)
MCHC: 28.2 g/dL — ABNORMAL LOW (ref 30.0–36.0)
MCV: 75.5 fL — ABNORMAL LOW (ref 80.0–100.0)
Platelets: 252 10*3/uL (ref 150–400)
RBC: 4.32 MIL/uL (ref 3.87–5.11)
RDW: 19.6 % — ABNORMAL HIGH (ref 11.5–15.5)
WBC: 11 10*3/uL — ABNORMAL HIGH (ref 4.0–10.5)
nRBC: 0 % (ref 0.0–0.2)

## 2022-09-16 LAB — BASIC METABOLIC PANEL
Anion gap: 9 (ref 5–15)
BUN: 15 mg/dL (ref 8–23)
CO2: 26 mmol/L (ref 22–32)
Calcium: 8.6 mg/dL — ABNORMAL LOW (ref 8.9–10.3)
Chloride: 101 mmol/L (ref 98–111)
Creatinine, Ser: 0.81 mg/dL (ref 0.44–1.00)
GFR, Estimated: >60 mL/min (ref >60)
Glucose, Bld: 167 mg/dL — ABNORMAL HIGH (ref 70–99)
Potassium: 4.2 mmol/L (ref 3.5–5.1)
Sodium: 136 mmol/L (ref 135–145)

## 2022-09-16 LAB — BRAIN NATRIURETIC PEPTIDE: B Natriuretic Peptide: 263 pg/mL — ABNORMAL HIGH (ref 0.0–100.0)

## 2022-09-16 MED ORDER — FUROSEMIDE 10 MG/ML IJ SOLN
80.0000 mg | Freq: Once | INTRAMUSCULAR | Status: AC
  Administered 2022-09-17: 80 mg via INTRAVENOUS
  Filled 2022-09-16: qty 8
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
## 2022-09-16 NOTE — ED Triage Notes (Signed)
Pt reports she went to her PCP with leg swelling and chest tightness for over a week and was told to "come to the ER to get the fluid off".

## 2022-09-16 NOTE — Patient Instructions (Signed)
Medication Instructions:  Your physician recommends that you continue on your current medications as directed. Please refer to the Current Medication list given to you today.   Labwork: none  Testing/Procedures: none  Follow-Up:  Your physician recommends that you schedule a follow-up appointment in: 1-2 weeks  Any Other Special Instructions Will Be Listed Below (If Applicable).  PLEASE GO TO [place] AS DICUSSED WITH Sharlene Dory, NP.  If you need a refill on your cardiac medications before your next appointment, please call your pharmacy.

## 2022-09-16 NOTE — ED Provider Notes (Signed)
[place] EMERGENCY DEPARTMENT AT [facility]  Provider Note  CSN: [id number] Arrival date & time: 09/16/22 1536  History Chief Complaint  Patient presents with   Leg Swelling    Ann [name] is a 77 y.o. female with history of afib on eliquis, CAD, CHF, HTN, HLD and COPD on home oxygen was seen at the Cardiology clinic today for leg swelling and SOB worsening for several weeks. She reports she had a LE doppler recently in [place] negative for DVT (not available in EMR) and was put on Abx for a LE cellulitis (she isn't sure what the name of it is). She had some chest tightness earlier in the week denies now. She reports she has been out of her lasix for about a month due to a delay in getting her meds from mail order pharmacy through the [place]. The NP at the cardiology clinic recommended she come to the ED to be admitted for IV diuresis and echocardiogram.    Home Medications Prior to Admission medications   Medication Sig Start Date End Date Taking? Authorizing Provider  acetaminophen (TYLENOL) 325 MG tablet Take 2 tablets (650 mg total) by mouth every 6 (six) hours as needed for mild pain, moderate pain or fever. 12/11/19   Shon Hale, MD  albuterol (VENTOLIN HFA) 108 (90 Base) MCG/ACT inhaler Inhale 2 puffs into the lungs every 4 (four) hours as needed for wheezing. 05/31/20   Angiulli, Mcarthur Rossetti, PA-C  apixaban (ELIQUIS) 5 MG TABS tablet Take 1 tablet (5 mg total) by mouth 2 (two) times daily. 05/31/20 10/17/68  Angiulli, Mcarthur Rossetti, PA-C  atorvastatin (LIPITOR) 80 MG tablet Take 1 tablet (80 mg total) by mouth daily at 6 PM. 05/31/20   Angiulli, Mcarthur Rossetti, PA-C  budesonide-formoterol (SYMBICORT) 80-4.5 MCG/ACT inhaler Take 2 puffs first thing in am and then another 2 puffs about 12 hours later. 09/09/22   Nyoka Cowden, MD  Carboxymethylcellulose Sod PF 0.5 % SOLN Place 2 drops into both eyes daily as needed (for dry eye relief).     [provider]  Cholecalciferol  (VITAMIN D3) 125 MCG (5000 UT) CAPS Take 1 capsule (5,000 Units total) by mouth daily. 05/31/20   Angiulli, Mcarthur Rossetti, PA-C  Continuous Blood Gluc Sensor (FREESTYLE LIBRE 2 SENSOR) MISC Use to check glucose as directed. Change sensor every 14 days. 08/01/22   Roma Kayser, MD  cyanocobalamin 1000 MCG tablet Take 1 tablet (1,000 mcg total) by mouth daily. 05/31/20   Angiulli, Mcarthur Rossetti, PA-C  diltiazem (CARDIZEM CD) 240 MG 24 hr capsule Take 1 capsule (240 mg total) by mouth daily. 06/01/20   Angiulli, Mcarthur Rossetti, PA-C  esomeprazole (NEXIUM) 40 MG capsule Take 1 capsule (40 mg total) by mouth 2 (two) times daily before a meal. 10/25/21 10/25/22  Lanelle Bal, DO  furosemide (LASIX) 40 MG tablet Take 1 tablet (40 mg total) by mouth 2 (two) times daily. 05/31/20   Angiulli, Mcarthur Rossetti, PA-C  gabapentin (NEURONTIN) 400 MG capsule Take 1 capsule (400 mg total) by mouth 3 (three) times daily. 05/31/20   Angiulli, Mcarthur Rossetti, PA-C  glipiZIDE (GLUCOTROL XL) 5 MG 24 hr tablet Take 1 tablet (5 mg total) by mouth daily with breakfast. 11/07/21   Nida, Denman George, MD  Insulin Lispro Prot & Lispro (HUMALOG MIX 75/25 KWIKPEN) (75-25) 100 UNIT/ML Kwikpen Inject 40-50 Units into the skin 2 (two) times daily before a meal. 08/21/22   Nida, Denman George, MD  ipratropium-albuterol (  DUONEB) 0.5-2.5 (3) MG/3ML SOLN Take 3 mLs by nebulization every 6 (six) hours as needed (copd). 03/17/20   [provider]  levothyroxine (SYNTHROID) 175 MCG tablet Take 1 tablet (175 mcg total) by mouth every morning. 11/16/20   Nida, Denman George, MD  Menthol-Methyl Salicylate (SALONPAS PAIN RELIEF PATCH) PTCH Apply 1 patch topically daily as needed (pain).    [provider]  nitroGLYCERIN (NITROSTAT) 0.4 MG SL tablet Place 1 tablet (0.4 mg total) under the tongue every 5 (five) minutes as needed for chest pain. 10/15/12   Kari Baars, MD  predniSONE (DELTASONE) 10 MG tablet Take 1 tablet (10 mg total) by mouth daily  with breakfast. 11/16/20   Roma Kayser, MD  solifenacin (VESICARE) 10 MG tablet Take 10 mg by mouth at bedtime.     [provider]  Vibegron (GEMTESA) 75 MG TABS Take 1 tablet (75 mg total) by mouth daily. 08/19/22   McKenzie, Mardene Celeste, MD     Allergies    Ace inhibitors, Asa [aspirin], Breztri aerosphere [budeson-glycopyrrol-formoterol], Tape, Niacin, and Reglan [metoclopramide]   Review of Systems   Review of Systems Please see HPI for pertinent positives and negatives  Physical Exam BP (!) 134/107   Pulse 90   Temp 97.8 F (36.6 C) (Temporal)   Resp 18   Ht  (1.6 m)   Wt 131.5 kg   SpO2 95%   BMI 51.37 kg/m   Physical Exam Vitals and nursing note reviewed.  Constitutional:      Appearance: Normal appearance. She is obese.  HENT:     Head: Normocephalic and atraumatic.     Nose: Nose normal.     Mouth/Throat:     Mouth: Mucous membranes are moist.  Eyes:     Extraocular Movements: Extraocular movements intact.     Conjunctiva/sclera: Conjunctivae normal.  Cardiovascular:     Rate and Rhythm: Normal rate.  Pulmonary:     Effort: Pulmonary effort is normal.     Breath sounds: Normal breath sounds.  Abdominal:     General: Abdomen is flat.     Palpations: Abdomen is soft.     Tenderness: There is no abdominal tenderness.  Musculoskeletal:        General: Normal range of motion.     Cervical back: Neck supple.     Right lower leg: Edema present.     Left lower leg: Edema present.     Comments: BLE edema, L>R, no erythema or warmth to suggest active infection  Skin:    General: Skin is warm and dry.  Neurological:     General: No focal deficit present.     Mental Status: She is alert.  Psychiatric:        Mood and Affect: Mood normal.     ED Results / Procedures / Treatments   EKG None  Procedures Procedures  Medications Ordered in the ED Medications  furosemide (LASIX) injection 80 mg (has no administration in time range)     Initial Impression and Plan  Patient here with LE edema and DOE, concern for acute on chronic CHF. Cardiology NP note reviewed, requesting admission for diuresis and echo. Also recommended CT to rule out dissection/PE although patient is already on Eliquis, not hypoxic or tachycardic. No concern for acute PE. Also does not have evidence of active cellulitis on exam, no indication for IV abx. Labs done in triage show CBC with anemia, at baseline. Mild leukocytosis, similar to previous. BMP is  unremarkable. BNP is mildly elevated. Added Trops given recent report of chest pain. I personally viewed the images from radiology studies and agree with radiologist interpretation: CXR without pulm edema.   ED Course       MDM Rules/Calculators/A&P Medical Decision Making Amount and/or Complexity of Data Reviewed Labs: ordered. Radiology: ordered.  Risk Prescription drug management.     Final Clinical Impression(s) / ED Diagnoses Final diagnoses:  None    Rx / DC Orders ED Discharge Orders     None

## 2022-09-17 ENCOUNTER — Other Ambulatory Visit: Payer: Self-pay

## 2022-09-17 DIAGNOSIS — H409 Unspecified glaucoma: Secondary | ICD-10-CM | POA: Diagnosis not present

## 2022-09-17 DIAGNOSIS — D509 Iron deficiency anemia, unspecified: Secondary | ICD-10-CM | POA: Diagnosis not present

## 2022-09-17 DIAGNOSIS — G8929 Other chronic pain: Secondary | ICD-10-CM | POA: Diagnosis not present

## 2022-09-17 DIAGNOSIS — I482 Chronic atrial fibrillation, unspecified: Secondary | ICD-10-CM | POA: Diagnosis not present

## 2022-09-17 DIAGNOSIS — K219 Gastro-esophageal reflux disease without esophagitis: Secondary | ICD-10-CM | POA: Diagnosis not present

## 2022-09-17 DIAGNOSIS — R7989 Other specified abnormal findings of blood chemistry: Secondary | ICD-10-CM

## 2022-09-17 DIAGNOSIS — L03116 Cellulitis of left lower limb: Secondary | ICD-10-CM | POA: Diagnosis not present

## 2022-09-17 DIAGNOSIS — Z794 Long term (current) use of insulin: Secondary | ICD-10-CM | POA: Diagnosis not present

## 2022-09-17 DIAGNOSIS — I5031 Acute diastolic (congestive) heart failure: Secondary | ICD-10-CM | POA: Diagnosis not present

## 2022-09-17 DIAGNOSIS — I130 Hypertensive heart and chronic kidney disease with heart failure and stage 1 through stage 4 chronic kidney disease, or unspecified chronic kidney disease: Secondary | ICD-10-CM | POA: Diagnosis not present

## 2022-09-17 DIAGNOSIS — E039 Hypothyroidism, unspecified: Secondary | ICD-10-CM | POA: Diagnosis not present

## 2022-09-17 DIAGNOSIS — J9611 Chronic respiratory failure with hypoxia: Secondary | ICD-10-CM | POA: Diagnosis not present

## 2022-09-17 DIAGNOSIS — Z6841 Body Mass Index (BMI) 40.0 and over, adult: Secondary | ICD-10-CM

## 2022-09-17 DIAGNOSIS — I25118 Atherosclerotic heart disease of native coronary artery with other forms of angina pectoris: Secondary | ICD-10-CM

## 2022-09-17 DIAGNOSIS — Z66 Do not resuscitate: Secondary | ICD-10-CM | POA: Diagnosis not present

## 2022-09-17 DIAGNOSIS — E782 Mixed hyperlipidemia: Secondary | ICD-10-CM | POA: Diagnosis not present

## 2022-09-17 DIAGNOSIS — E1165 Type 2 diabetes mellitus with hyperglycemia: Secondary | ICD-10-CM

## 2022-09-17 DIAGNOSIS — I2781 Cor pulmonale (chronic): Secondary | ICD-10-CM | POA: Diagnosis not present

## 2022-09-17 DIAGNOSIS — J449 Chronic obstructive pulmonary disease, unspecified: Secondary | ICD-10-CM | POA: Diagnosis not present

## 2022-09-17 DIAGNOSIS — M1990 Unspecified osteoarthritis, unspecified site: Secondary | ICD-10-CM | POA: Diagnosis not present

## 2022-09-17 DIAGNOSIS — E1122 Type 2 diabetes mellitus with diabetic chronic kidney disease: Secondary | ICD-10-CM | POA: Diagnosis not present

## 2022-09-17 DIAGNOSIS — I4819 Other persistent atrial fibrillation: Secondary | ICD-10-CM | POA: Diagnosis not present

## 2022-09-17 DIAGNOSIS — E2740 Unspecified adrenocortical insufficiency: Secondary | ICD-10-CM | POA: Diagnosis not present

## 2022-09-17 DIAGNOSIS — E1142 Type 2 diabetes mellitus with diabetic polyneuropathy: Secondary | ICD-10-CM | POA: Diagnosis not present

## 2022-09-17 DIAGNOSIS — I5033 Acute on chronic diastolic (congestive) heart failure: Secondary | ICD-10-CM | POA: Diagnosis not present

## 2022-09-17 DIAGNOSIS — M549 Dorsalgia, unspecified: Secondary | ICD-10-CM | POA: Diagnosis not present

## 2022-09-17 DIAGNOSIS — R0602 Shortness of breath: Secondary | ICD-10-CM | POA: Diagnosis not present

## 2022-09-17 DIAGNOSIS — E876 Hypokalemia: Secondary | ICD-10-CM | POA: Diagnosis not present

## 2022-09-17 LAB — GLUCOSE, CAPILLARY
Glucose-Capillary: 139 mg/dL — ABNORMAL HIGH (ref 70–99)
Glucose-Capillary: 143 mg/dL — ABNORMAL HIGH (ref 70–99)
Glucose-Capillary: 150 mg/dL — ABNORMAL HIGH (ref 70–99)
Glucose-Capillary: 215 mg/dL — ABNORMAL HIGH (ref 70–99)

## 2022-09-17 LAB — BASIC METABOLIC PANEL
Anion gap: 11 (ref 5–15)
BUN: 16 mg/dL (ref 8–23)
CO2: 29 mmol/L (ref 22–32)
Calcium: 8.9 mg/dL (ref 8.9–10.3)
Chloride: 100 mmol/L (ref 98–111)
Creatinine, Ser: 0.88 mg/dL (ref 0.44–1.00)
GFR, Estimated: >60 mL/min (ref >60)
Glucose, Bld: 147 mg/dL — ABNORMAL HIGH (ref 70–99)
Potassium: 4 mmol/L (ref 3.5–5.1)
Sodium: 140 mmol/L (ref 135–145)

## 2022-09-17 LAB — TROPONIN I (HIGH SENSITIVITY)
Troponin I (High Sensitivity): 32 ng/L — ABNORMAL HIGH (ref <18)
Troponin I (High Sensitivity): 32 ng/L — ABNORMAL HIGH (ref <18)

## 2022-09-17 LAB — IRON AND TIBC
Iron: 23 ug/dL — ABNORMAL LOW (ref 28–170)
Saturation Ratios: 5 % — ABNORMAL LOW (ref 10.4–31.8)
TIBC: 476 ug/dL — ABNORMAL HIGH (ref 250–450)
UIBC: 453 ug/dL

## 2022-09-17 LAB — CBC
HCT: 35 % — ABNORMAL LOW (ref 36.0–46.0)
Hemoglobin: 9.8 g/dL — ABNORMAL LOW (ref 12.0–15.0)
MCH: 21.3 pg — ABNORMAL LOW (ref 26.0–34.0)
MCHC: 28 g/dL — ABNORMAL LOW (ref 30.0–36.0)
MCV: 75.9 fL — ABNORMAL LOW (ref 80.0–100.0)
Platelets: 271 10*3/uL (ref 150–400)
RBC: 4.61 MIL/uL (ref 3.87–5.11)
RDW: 19.9 % — ABNORMAL HIGH (ref 11.5–15.5)
WBC: 9.6 10*3/uL (ref 4.0–10.5)
nRBC: 0 % (ref 0.0–0.2)

## 2022-09-17 LAB — FERRITIN: Ferritin: 8 ng/mL — ABNORMAL LOW (ref 11–307)

## 2022-09-17 LAB — MAGNESIUM: Magnesium: 1.8 mg/dL (ref 1.7–2.4)

## 2022-09-17 LAB — PHOSPHORUS: Phosphorus: 4.3 mg/dL (ref 2.5–4.6)

## 2022-09-17 MED ORDER — ACETAMINOPHEN 650 MG RE SUPP: 650.0000 mg | Freq: Four times a day (QID) | RECTAL | Status: DC | PRN

## 2022-09-17 MED ORDER — INSULIN ASPART 100 UNIT/ML IJ SOLN
0.0000 [IU] | Freq: Every day | INTRAMUSCULAR | Status: DC
  Administered 2022-09-17 – 2022-09-18 (×2): 2 [IU] via SUBCUTANEOUS
  Administered 2022-09-21: 3 [IU] via SUBCUTANEOUS

## 2022-09-17 MED ORDER — PANTOPRAZOLE SODIUM 40 MG PO TBEC
40.0000 mg | DELAYED_RELEASE_TABLET | Freq: Two times a day (BID) | ORAL | Status: DC
  Administered 2022-09-17 – 2022-09-23 (×12): 40 mg via ORAL
  Filled 2022-09-17 (×12): qty 1

## 2022-09-17 MED ORDER — ONDANSETRON HCL 4 MG PO TABS: 4.0000 mg | ORAL_TABLET | Freq: Four times a day (QID) | ORAL | Status: DC | PRN

## 2022-09-17 MED ORDER — PANTOPRAZOLE SODIUM 40 MG PO TBEC: 80.0000 mg | DELAYED_RELEASE_TABLET | Freq: Every day | ORAL | Status: DC

## 2022-09-17 MED ORDER — DILTIAZEM HCL ER COATED BEADS 180 MG PO CP24
180.0000 mg | ORAL_CAPSULE | Freq: Every day | ORAL | Status: DC
  Administered 2022-09-18: 180 mg via ORAL
  Filled 2022-09-17: qty 1

## 2022-09-17 MED ORDER — ACETAMINOPHEN 325 MG PO TABS
650.0000 mg | ORAL_TABLET | Freq: Four times a day (QID) | ORAL | Status: DC | PRN
  Administered 2022-09-18 – 2022-09-22 (×5): 650 mg via ORAL
  Filled 2022-09-17 (×5): qty 2

## 2022-09-17 MED ORDER — PREDNISONE 20 MG PO TABS
10.0000 mg | ORAL_TABLET | Freq: Every day | ORAL | Status: DC
  Administered 2022-09-17 – 2022-09-23 (×7): 10 mg via ORAL
  Filled 2022-09-17 (×7): qty 1

## 2022-09-17 MED ORDER — SODIUM CHLORIDE 0.9 % IV SOLN
2.0000 g | INTRAVENOUS | Status: DC
  Administered 2022-09-18 – 2022-09-22 (×5): 2 g via INTRAVENOUS
  Filled 2022-09-17 (×6): qty 20

## 2022-09-17 MED ORDER — LEVOTHYROXINE SODIUM 75 MCG PO TABS
175.0000 ug | ORAL_TABLET | Freq: Every morning | ORAL | Status: DC
  Administered 2022-09-18 – 2022-09-23 (×6): 175 ug via ORAL
  Filled 2022-09-17 (×6): qty 1

## 2022-09-17 MED ORDER — FESOTERODINE FUMARATE ER 4 MG PO TB24
4.0000 mg | ORAL_TABLET | Freq: Every day | ORAL | Status: DC
  Administered 2022-09-18 – 2022-09-23 (×6): 4 mg via ORAL
  Filled 2022-09-17 (×6): qty 1

## 2022-09-17 MED ORDER — VITAMIN D 25 MCG (1000 UNIT) PO TABS
5000.0000 [IU] | ORAL_TABLET | Freq: Every day | ORAL | Status: DC
  Administered 2022-09-18 – 2022-09-23 (×6): 5000 [IU] via ORAL
  Filled 2022-09-17 (×6): qty 5

## 2022-09-17 MED ORDER — APIXABAN 5 MG PO TABS
5.0000 mg | ORAL_TABLET | Freq: Two times a day (BID) | ORAL | Status: DC
  Administered 2022-09-17 – 2022-09-23 (×12): 5 mg via ORAL
  Filled 2022-09-17 (×12): qty 1

## 2022-09-17 MED ORDER — ALBUTEROL SULFATE HFA 108 (90 BASE) MCG/ACT IN AERS: 2.0000 | INHALATION_SPRAY | RESPIRATORY_TRACT | Status: DC | PRN

## 2022-09-17 MED ORDER — INSULIN GLARGINE-YFGN 100 UNIT/ML ~~LOC~~ SOLN
8.0000 [IU] | Freq: Every day | SUBCUTANEOUS | Status: DC
  Administered 2022-09-17 – 2022-09-22 (×6): 8 [IU] via SUBCUTANEOUS
  Filled 2022-09-17 (×7): qty 0.08

## 2022-09-17 MED ORDER — FUROSEMIDE 10 MG/ML IJ SOLN
40.0000 mg | Freq: Two times a day (BID) | INTRAMUSCULAR | Status: DC
  Administered 2022-09-17 – 2022-09-20 (×7): 40 mg via INTRAVENOUS
  Filled 2022-09-17 (×8): qty 4

## 2022-09-17 MED ORDER — ATORVASTATIN CALCIUM 40 MG PO TABS
80.0000 mg | ORAL_TABLET | Freq: Every day | ORAL | Status: DC
  Administered 2022-09-17 – 2022-09-22 (×6): 80 mg via ORAL
  Filled 2022-09-17 (×6): qty 2

## 2022-09-17 MED ORDER — INSULIN ASPART 100 UNIT/ML IJ SOLN
0.0000 [IU] | Freq: Three times a day (TID) | INTRAMUSCULAR | Status: DC
  Administered 2022-09-17: 2 [IU] via SUBCUTANEOUS
  Administered 2022-09-18: 11 [IU] via SUBCUTANEOUS
  Administered 2022-09-18: 3 [IU] via SUBCUTANEOUS
  Administered 2022-09-18: 2 [IU] via SUBCUTANEOUS
  Administered 2022-09-19: 5 [IU] via SUBCUTANEOUS
  Administered 2022-09-19 (×2): 3 [IU] via SUBCUTANEOUS
  Administered 2022-09-20: 2 [IU] via SUBCUTANEOUS
  Administered 2022-09-20: 5 [IU] via SUBCUTANEOUS
  Administered 2022-09-20: 3 [IU] via SUBCUTANEOUS
  Administered 2022-09-21: 8 [IU] via SUBCUTANEOUS
  Administered 2022-09-21: 3 [IU] via SUBCUTANEOUS
  Administered 2022-09-21: 8 [IU] via SUBCUTANEOUS
  Administered 2022-09-22: 11 [IU] via SUBCUTANEOUS
  Administered 2022-09-22: 3 [IU] via SUBCUTANEOUS
  Administered 2022-09-22 – 2022-09-23 (×2): 8 [IU] via SUBCUTANEOUS
  Administered 2022-09-23: 3 [IU] via SUBCUTANEOUS

## 2022-09-17 MED ORDER — SALONPAS PAIN RELIEF PATCH EX PTCH: 1.0000 | MEDICATED_PATCH | Freq: Every day | CUTANEOUS | Status: DC | PRN

## 2022-09-17 MED ORDER — POLYVINYL ALCOHOL 1.4 % OP SOLN: 2.0000 [drp] | Freq: Every day | OPHTHALMIC | Status: DC | PRN

## 2022-09-17 MED ORDER — FUROSEMIDE 10 MG/ML IJ SOLN: 40.0000 mg | Freq: Two times a day (BID) | INTRAMUSCULAR | Status: DC

## 2022-09-17 MED ORDER — GABAPENTIN 400 MG PO CAPS
400.0000 mg | ORAL_CAPSULE | Freq: Three times a day (TID) | ORAL | Status: DC
  Administered 2022-09-17 – 2022-09-23 (×18): 400 mg via ORAL
  Filled 2022-09-17 (×18): qty 1

## 2022-09-17 MED ORDER — FLUTICASONE FUROATE-VILANTEROL 100-25 MCG/ACT IN AEPB
1.0000 | INHALATION_SPRAY | Freq: Every day | RESPIRATORY_TRACT | Status: DC
  Administered 2022-09-18 – 2022-09-23 (×6): 1 via RESPIRATORY_TRACT
  Filled 2022-09-17: qty 28

## 2022-09-17 MED ORDER — FUROSEMIDE 10 MG/ML IJ SOLN
40.0000 mg | Freq: Two times a day (BID) | INTRAMUSCULAR | Status: DC
  Administered 2022-09-17: 40 mg via INTRAVENOUS
  Filled 2022-09-17 (×3): qty 4

## 2022-09-17 MED ORDER — ALBUTEROL SULFATE (2.5 MG/3ML) 0.083% IN NEBU: 2.5000 mg | INHALATION_SOLUTION | RESPIRATORY_TRACT | Status: DC | PRN

## 2022-09-17 MED ORDER — ONDANSETRON HCL 4 MG/2ML IJ SOLN: 4.0000 mg | Freq: Four times a day (QID) | INTRAMUSCULAR | Status: DC | PRN

## 2022-09-17 NOTE — Plan of Care (Signed)
  Problem: Education: Goal: Knowledge of General Education information will improve Description: Including pain rating scale, medication(s)/side effects and non-pharmacologic comfort measures Outcome: Progressing   Problem: Safety: Goal: Ability to remain free from injury will improve Outcome: Progressing   Problem: Skin Integrity: Goal: Risk for impaired skin integrity will decrease Outcome: Progressing   Admitted to room 313, discussed fall prevention/safety plan with patient, call light within reach, bedalarm activated, verbalizes and demonstrates use of call light for assistance. Denies pain or other complaints, purewick remains in place with clear, yellow urine due to receiving diuresis. Sacral foam applied. Patient reports recently "taking an antibiotic for my cellulitis in left leg and antifungal cream for my foot" notified Dr Benjamine Mola.

## 2022-09-17 NOTE — Progress Notes (Signed)
Patient admitted after midnight, please see H&P.  Here with:   acute on chronic diastolic CHF -per cardiology note, has been out of diuretic for 1 month as was not delivered with other meds BNP 263 Echocardiogram done on 01/24/2020 showed LVEF of 60 to 65%.  No RWMA.  Moderate LVH.  LV diastolic parameters are indeterminate. Continue total input/output, daily weights and fluid restriction -IV lasix-- monitor CR and BP closely Continue Cardiac diet  Echocardiogram pending update  Left LE cellulitis -IV abx -monitor for response-- have asked the RNs to mark edges  Microcytic anemia -trend   Elevated troponin possibly secondary to type II demand ischemia Troponin x 2 was flat at 32, patient denies any chest pain.   Morbid obesity (BMI 51.37) Diet and lifestyle modification Patient may need outpatient follow-up with PCP for weight loss program   Atrial fibrillation on Eliquis Continue Eliquis -re-order Cardizem at a lower dose   COPD on home oxygen Continue albuterol Continue supplemental oxygen to obtain O2 sat > 92%   Mixed hyperlipidemia Continue Lipitor   GERD Continue Protonix   Type 2 diabetes mellitus with hyperglycemia Hemoglobin A1c on 08/01/2022 was 7.8 Continue Semglee 8 units nightly and adjust dose accordingly -SSI   CAD Eliquis, Lipitor   Acquired hypothyroidism Continue Synthroid  Marlin Canary DO

## 2022-09-17 NOTE — TOC Progression Note (Signed)
Transition of Care [facility]) - Progression Note    Patient Details  Name: Ann [name] MRN: [id number] Date of Birth: 12/20/1945  Transition of Care [facility]) CM/SW Contact  Karn Cassis, [place] Phone Number: 09/17/2022, 10:07 AM  Clinical Narrative:  Transition of Care [facility]) Screening Note   Patient Details  Name: Ann [name] Date of Birth: 01-27-1946   Transition of Care [facility]) CM/SW Contact:    Karn Cassis, LCSW Phone Number: 09/17/2022, 10:07 AM    Transition of Care Department [facility]) has reviewed patient and no TOC needs have been identified at this time. We will continue to monitor patient advancement through interdisciplinary progression rounds. If new patient transition needs arise, please place a TOC consult.          Barriers to Discharge: Continued Medical Work up  Expected Discharge Plan and Services                                               Social Determinants of Health (SDOH) Interventions SDOH Screenings   Food Insecurity: No Food Insecurity (09/17/2022)  Housing: Low Risk  (09/17/2022)  Transportation Needs: No Transportation Needs (09/17/2022)  Utilities: Not At Risk (09/17/2022)  Depression (PHQ2-9): Low Risk  (07/04/2020)  Tobacco Use: Medium Risk (09/16/2022)    Readmission Risk Interventions    12/19/2020    4:03 PM 05/16/2020    2:33 PM 01/24/2020   11:25 AM  Readmission Risk Prevention Plan  Transportation Screening Complete Complete Complete  HRI or Home Care Consult   Complete  Social Work Consult for Recovery Care Planning/Counseling   Complete  Palliative Care Screening   Not Applicable  Medication Review [employer]) Complete Complete Complete  HRI or Home Care Consult Complete Complete   SW Recovery Care/Counseling Consult Complete Complete   Palliative Care Screening Not Applicable Not Applicable   Skilled Nursing Facility Not Applicable Patient Refused
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
## 2022-09-18 ENCOUNTER — Inpatient Hospital Stay (HOSPITAL_COMMUNITY): Payer: Medicare HMO

## 2022-09-18 DIAGNOSIS — Z6841 Body Mass Index (BMI) 40.0 and over, adult: Secondary | ICD-10-CM | POA: Diagnosis not present

## 2022-09-18 DIAGNOSIS — I5031 Acute diastolic (congestive) heart failure: Secondary | ICD-10-CM

## 2022-09-18 DIAGNOSIS — R7989 Other specified abnormal findings of blood chemistry: Secondary | ICD-10-CM | POA: Diagnosis not present

## 2022-09-18 DIAGNOSIS — I482 Chronic atrial fibrillation, unspecified: Secondary | ICD-10-CM | POA: Diagnosis not present

## 2022-09-18 LAB — COMPREHENSIVE METABOLIC PANEL
ALT: 16 U/L (ref 0–44)
AST: 17 U/L (ref 15–41)
Albumin: 3.3 g/dL — ABNORMAL LOW (ref 3.5–5.0)
Alkaline Phosphatase: 91 U/L (ref 38–126)
Anion gap: 10 (ref 5–15)
BUN: 19 mg/dL (ref 8–23)
CO2: 28 mmol/L (ref 22–32)
Calcium: 8.7 mg/dL — ABNORMAL LOW (ref 8.9–10.3)
Chloride: 99 mmol/L (ref 98–111)
Creatinine, Ser: 0.85 mg/dL (ref 0.44–1.00)
GFR, Estimated: >60 mL/min (ref >60)
Glucose, Bld: 157 mg/dL — ABNORMAL HIGH (ref 70–99)
Potassium: 3.7 mmol/L (ref 3.5–5.1)
Sodium: 137 mmol/L (ref 135–145)
Total Bilirubin: 0.5 mg/dL (ref 0.3–1.2)
Total Protein: 7 g/dL (ref 6.5–8.1)

## 2022-09-18 LAB — ECHOCARDIOGRAM COMPLETE
AR max vel: 1.89 cm2
AV Area VTI: 1.66 cm2
AV Area mean vel: 1.78 cm2
AV Mean grad: 2 mmHg
AV Peak grad: 3.1 mmHg
Ao pk vel: 0.88 m/s
Area-P 1/2: 4.63 cm2
Height: 63 in
S' Lateral: 3.4 cm
Weight: 4409.2 oz

## 2022-09-18 LAB — CBC
HCT: 33.4 % — ABNORMAL LOW (ref 36.0–46.0)
Hemoglobin: 9.4 g/dL — ABNORMAL LOW (ref 12.0–15.0)
MCH: 20.8 pg — ABNORMAL LOW (ref 26.0–34.0)
MCHC: 28.1 g/dL — ABNORMAL LOW (ref 30.0–36.0)
MCV: 73.9 fL — ABNORMAL LOW (ref 80.0–100.0)
Platelets: 254 10*3/uL (ref 150–400)
RBC: 4.52 MIL/uL (ref 3.87–5.11)
RDW: 19.7 % — ABNORMAL HIGH (ref 11.5–15.5)
WBC: 9.1 10*3/uL (ref 4.0–10.5)
nRBC: 0 % (ref 0.0–0.2)

## 2022-09-18 LAB — GLUCOSE, CAPILLARY
Glucose-Capillary: 148 mg/dL — ABNORMAL HIGH (ref 70–99)
Glucose-Capillary: 186 mg/dL — ABNORMAL HIGH (ref 70–99)
Glucose-Capillary: 335 mg/dL — ABNORMAL HIGH (ref 70–99)
Glucose-Capillary: 210 mg/dL — ABNORMAL HIGH (ref 70–99)

## 2022-09-18 LAB — HEMOGLOBIN A1C
Hgb A1c MFr Bld: 8 % — ABNORMAL HIGH (ref 4.8–5.6)
Mean Plasma Glucose: 183 mg/dL

## 2022-09-18 MED ORDER — DILTIAZEM HCL ER COATED BEADS 120 MG PO CP24
240.0000 mg | ORAL_CAPSULE | Freq: Every day | ORAL | Status: DC
  Administered 2022-09-19 – 2022-09-23 (×5): 240 mg via ORAL
  Filled 2022-09-18 (×5): qty 2

## 2022-09-18 MED ORDER — DILTIAZEM HCL 30 MG PO TABS
30.0000 mg | ORAL_TABLET | Freq: Two times a day (BID) | ORAL | Status: AC
  Administered 2022-09-18 (×2): 30 mg via ORAL
  Filled 2022-09-18 (×2): qty 1

## 2022-09-18 MED ORDER — METOPROLOL TARTRATE 5 MG/5ML IV SOLN
INTRAVENOUS | Status: AC
  Filled 2022-09-18: qty 5

## 2022-09-18 MED ORDER — METOPROLOL TARTRATE 5 MG/5ML IV SOLN
5.0000 mg | Freq: Once | INTRAVENOUS | Status: AC
  Administered 2022-09-18: 5 mg via INTRAVENOUS

## 2022-09-18 NOTE — Progress Notes (Signed)
Pt's R increased to 140-150 from transfer from chair to bed. Will attempt when HR decreased.

## 2022-09-18 NOTE — Plan of Care (Signed)
  Problem: Acute Rehab PT Goals(only PT should resolve) Goal: Pt Will Go Supine/Side To Sit Outcome: Progressing Flowsheets (Taken 09/18/2022 1442) Pt will go Supine/Side to Sit: with modified independence Goal: Patient Will Transfer Sit To/From Stand Outcome: Progressing Flowsheets (Taken 09/18/2022 1442) Patient will transfer sit to/from stand:  with modified independence  with supervision Goal: Pt Will Transfer Bed To Chair/Chair To Bed Outcome: Progressing Flowsheets (Taken 09/18/2022 1442) Pt will Transfer Bed to Chair/Chair to Bed:  with modified independence  with supervision Goal: Pt Will Ambulate Outcome: Progressing Flowsheets (Taken 09/18/2022 1442) Pt will Ambulate:  50 feet  with supervision  with modified independence  with rolling walker   2:42 PM, 09/18/22 Ocie Bob, MPT Physical Therapist with [facility] [phone] office [phone] mobile phone

## 2022-09-18 NOTE — Evaluation (Signed)
Physical Therapy Evaluation Patient Details Name: Ann [name] MRN: [id number] DOB: December 08, 1945 Today's Date: 09/18/2022  History of Present Illness  Ann [name] is a 77 y.o. female with medical history significant of atrial fibrillation on Eliquis, COPD on home oxygen, hyperlipidemia, GERD, type 2 diabetes mellitus, CAD, hypothyroidism who presents to the emergency department due to several weeks onset of worsening shortness of breath and leg swelling went to see cardiologist yesterday.  She complained of shortness of breath on exertion and that she could barely walk 10 feet without being short of breath.  She states that she recently had lower extremity Doppler ultrasound in [facility] which was negative for DVT and that she was placed on antibiotics for lower extremity cellulitis.  Patient states that she ran out of her Lasix since the last 3 to 4 weeks due to delay in mail order pharmacy through the [place] system.  She was asked to go to the ED for IV diuresis and echocardiogram by the NP at the cardiologist office.   Clinical Impression  Patient functioning near baseline for functional mobility and gait demonstrating good return for ambulating in room, transferring to/from commode in bathroom, chair at bedside without loss of balance.  Patient mostly limited due to HR increasing above 160 bpm and fatigue - nursing staff aware.  Patient put back to bed after therapy.  Patient will benefit from continued skilled physical therapy in hospital and recommended venue below to increase strength, balance, endurance for safe ADLs and gait.         Recommendations for follow up therapy are one component of a multi-disciplinary discharge planning process, led by the attending physician.  Recommendations may be updated based on patient status, additional functional criteria and insurance authorization.  Follow Up Recommendations       Assistance Recommended at Discharge Set up Supervision/Assistance  Patient  can return home with the following  A little help with walking and/or transfers;A little help with bathing/dressing/bathroom;Help with stairs or ramp for entrance;Assistance with cooking/housework    Equipment Recommendations None recommended by PT  Recommendations for Other Services       Functional Status Assessment Patient has had a recent decline in their functional status and demonstrates the ability to make significant improvements in function in a reasonable and predictable amount of time.     Precautions / Restrictions Precautions Precautions: Fall Restrictions Weight Bearing Restrictions: No      Mobility  Bed Mobility Overal bed mobility: Modified Independent                  Transfers Overall transfer level: Needs assistance Equipment used: Rolling walker (2 wheels) Transfers: Sit to/from Stand, Bed to chair/wheelchair/BSC Sit to Stand: Supervision   Step pivot transfers: Supervision       General transfer comment: slightly labored movement    Ambulation/Gait Ambulation/Gait assistance: Supervision, Min guard Gait Distance (Feet): 15 Feet Assistive device: Rolling walker (2 wheels) Gait Pattern/deviations: Decreased step length - right, Decreased step length - left, Decreased stride length, Trunk flexed Gait velocity: decreased     General Gait Details: slightly labored cadence without loss of balance, limited mostly due to fatigue and increasing HR above 160 bpm  Stairs            Wheelchair Mobility    Modified Rankin (Stroke Patients Only)       Balance Overall balance assessment: Needs assistance Sitting-balance support: Feet supported, No upper extremity supported Sitting balance-Leahy Scale: Good Sitting balance - Comments:  seated at EOB   Standing balance support: During functional activity, Bilateral upper extremity supported Standing balance-Leahy Scale: Fair Standing balance comment: fair/good using RW                              Pertinent Vitals/Pain Pain Assessment Pain Assessment: No/denies pain    Home Living Family/patient expects to be discharged to:: Private residence Living Arrangements: Spouse/significant other Available Help at Discharge: Family;Available 24 hours/day Type of Home: House Home Access: Ramped entrance     Alternate Level Stairs-Number of Steps: flight Home Layout: Two level;Able to live on main level with bedroom/bathroom;Full bath on main level Home Equipment: Rollator (4 wheels);BSC/3in1;Grab bars - tub/shower;Wheelchair - manual;Hospital bed      Prior Function Prior Level of Function : Needs assist       Physical Assist : Mobility (physical);ADLs (physical) Mobility (physical): Bed mobility;Transfers;Gait;Stairs   Mobility Comments: household ambulator using RW ADLs Comments: Assisted by family     Hand Dominance   Dominant Hand: Right    Extremity/Trunk Assessment   Upper Extremity Assessment Upper Extremity Assessment: Overall WFL for tasks assessed    Lower Extremity Assessment Lower Extremity Assessment: Generalized weakness    Cervical / Trunk Assessment Cervical / Trunk Assessment: Normal  Communication   Communication: No difficulties  Cognition Arousal/Alertness: Awake/alert Behavior During Therapy: WFL for tasks assessed/performed Overall Cognitive Status: Within Functional Limits for tasks assessed                                          General Comments      Exercises     Assessment/Plan    PT Assessment Patient needs continued PT services  PT Problem List Decreased strength;Decreased activity tolerance;Decreased balance;Decreased mobility       PT Treatment Interventions DME instruction;Gait training;Stair training;Functional mobility training;Therapeutic activities;Therapeutic exercise;Patient/family education;Balance training    PT Goals (Current goals can be found in the Care Plan section)   Acute Rehab PT Goals Patient Stated Goal: return home with family to assist PT Goal Formulation: With patient Time For Goal Achievement: 09/24/22 Potential to Achieve Goals: Good    Frequency Min 3X/week     Co-evaluation               AM-PAC PT "6 Clicks" Mobility  Outcome Measure Help needed turning from your back to your side while in a flat bed without using bedrails?: None Help needed moving from lying on your back to sitting on the side of a flat bed without using bedrails?: None Help needed moving to and from a bed to a chair (including a wheelchair)?: A Little Help needed standing up from a chair using your arms (e.g., wheelchair or bedside chair)?: None Help needed to walk in hospital room?: A Little Help needed climbing 3-5 steps with a railing? : A Little 6 Click Score: 21    End of Session Equipment Utilized During Treatment: Oxygen Activity Tolerance: Patient tolerated treatment well;Patient limited by fatigue Patient left: in bed;with call bell/phone within reach Nurse Communication: Mobility status PT Visit Diagnosis: Unsteadiness on feet (R26.81);Other abnormalities of gait and mobility (R26.89);Muscle weakness (generalized) (M62.81)    Time: 4540-9811 PT Time Calculation (min) (ACUTE ONLY): 15 min   Charges:   PT Evaluation $PT Eval Low Complexity: 1 Low PT Treatments $Therapeutic Activity: 8-22 mins  2:41 PM, 09/18/22 Ocie Bob, MPT Physical Therapist with [facility] [phone] office [phone] mobile phone

## 2022-09-18 NOTE — Progress Notes (Signed)
Pt in chair  

## 2022-09-18 NOTE — Progress Notes (Signed)
  Echocardiogram 2D Echocardiogram has been performed.  Maren Reamer 09/18/2022, 1:37 PM

## 2022-09-18 NOTE — Progress Notes (Addendum)
PROGRESS NOTE    Ann [name]  JYN:[id number] DOB: 02-01-1946 DOA: 09/16/2022 PCP: Benetta Spar, MD   Brief Narrative: Ann [name] is a 77 y.o. female with a history of atrial fibrillation, COPD on oxygen, hyperlipidemia, GERD, diabetes mellitus type 2, CAD, hypothyroidism. Patient presented secondary to leg swelling and shortness of breath with exertion and was found to have evidence of acute heart failure in addition to left leg cellulitis. Hospitalization complicated by atrial fibrillation with RVR.   Assessment and Plan:  Acute on chronic diastolic heart failure Last Transthoracic Echocardiogram from 2021 significant for an LVEF of 60-65%. Patient without evidence of acute pulmonary disease on imaging but with concern for acute illness from symptomatology. Patient started on Lasix IV. -Continue Lasix IV  Left LE cellulitis Empiric Ceftriaxone IV started. Some improvement from initial margins. -Continue Ceftriaxone  Atrial fibrillation with RVR In setting of acute illness but also with home Cardizem decreased on admission. Patient started on Cardizem CD 180 mg on admission. Cardizem 30 mg BID added for rate control without improvement of rate. Metoprolol 5 mg IV added with improvement of heart rate to 80s-100s. -Increase to Cardizem CD 240 mg daily in AM  Microcytic anemia Chronic. Stable.  Elevated troponin Minimal elevation with flat trend of 32 > 32. No chest pain.  COPD Stable.  Chronic respiratory failure with hypoxia Stable. -Continue supplemental oxygen  Hyperlipidemia -Continue Lipitor  GERD -Continue Protonix  Diabetes mellitus type 2 Controlled with hemoglobin A1C of 7.8%. Patient is managed on Humalog as an outpatient. Patient started on Semglee and SSI on admission. -Continue Semglee and SSI  CAD Noted. -Continue Eliquis/Lipitor  Hypothyroidism -Continue Synthroid  Morbid obesity Estimated body mass index is 48.82 kg/m as  calculated from the following:   Height as of this encounter: 5\' 3"  (1.6 m).   Weight as of this encounter: 125 kg.  DVT prophylaxis: Eliquis Code Status:   Code Status: DNR Family Communication: None at bedside Disposition Plan: Discharge home likely in 2-3 days pending continued diuresis and continued improvement of cellulitis   Consultants:  None  Procedures:  None  Antimicrobials: Ceftriaxone IV    Subjective: Patient reports feeling better than on admission. Only issue today is a headache. No chest pain or dyspnea. No palpitations.  Objective: BP 127/88 (BP Location: Left Arm)   Pulse 100   Temp 98.2 F (36.8 C) (Oral)   Resp 18   Ht 5\' 3"  (1.6 m)   Wt 125 kg   SpO2 95%   BMI 48.82 kg/m   Examination:  General exam: Appears calm and comfortable Respiratory system: Clear to auscultation. Respiratory effort normal. Cardiovascular system: S1 & S2 heard, RRR. Gastrointestinal system: Abdomen is nondistended, soft and nontender. Normal bowel sounds heard. Central nervous system: Alert and oriented. No focal neurological deficits. Musculoskeletal: BLE edema. No calf tenderness Skin: No cyanosis. Left lower leg cellulitis has receded from initial markings Psychiatry: Judgement and insight appear normal. Mood & affect appropriate.    Data Reviewed: I have personally reviewed following labs and imaging studies  CBC Lab Results  Component Value Date   WBC 9.1 09/18/2022   RBC 4.52 09/18/2022   HGB 9.4 (L) 09/18/2022   HCT 33.4 (L) 09/18/2022   MCV 73.9 (L) 09/18/2022   MCH 20.8 (L) 09/18/2022   PLT 254 09/18/2022   MCHC 28.1 (L) 09/18/2022   RDW 19.7 (H) 09/18/2022   LYMPHSABS 1.4 09/09/2022   MONOABS 0.8 12/23/2020   EOSABS 0.2  09/09/2022   BASOSABS 0.0 09/09/2022     Last metabolic panel Lab Results  Component Value Date   NA 137 09/18/2022   K 3.7 09/18/2022   CL 99 09/18/2022   CO2 28 09/18/2022   BUN 19 09/18/2022   CREATININE 0.85  09/18/2022   GLUCOSE 157 (H) 09/18/2022   GFRNONAA >60 09/18/2022   GFRAA 85 04/11/2020   CALCIUM 8.7 (L) 09/18/2022   PHOS 4.3 09/17/2022   PROT 7.0 09/18/2022   ALBUMIN 3.3 (L) 09/18/2022   LABGLOB 3.2 06/07/2022   AGRATIO 1.2 06/07/2022   BILITOT 0.5 09/18/2022   ALKPHOS 91 09/18/2022   AST 17 09/18/2022   ALT 16 09/18/2022   ANIONGAP 10 09/18/2022    GFR: Estimated Creatinine Clearance: 71.2 mL/min (by C-G formula based on SCr of 0.85 mg/dL).  No results found for this or any previous visit (from the past 240 hour(s)).    Radiology Studies: ECHOCARDIOGRAM COMPLETE  Result Date: 09/18/2022    ECHOCARDIOGRAM REPORT   Patient Name:   Ann [name] Date of Exam: 09/18/2022 Medical Rec #:  [id number]        Height:       63.0 in Accession #:    [id number]       Weight:       275.6 lb Date of Birth:  [birth year]         BSA:          2.216 m Patient Age:    77 years         BP:           109/80 mmHg Patient Gender: F                HR:           96 bpm. [facility] Procedure: 2D Echo, Cardiac Doppler and Color Doppler Indications:    CHF-Acute Diastolic I50.31  History:        Patient has prior history of Echocardiogram examinations, most                 recent 01/24/2020. CAD, COPD, Arrythmias:Atrial Fibrillation;                 Risk Factors:Diabetes, Dyslipidemia, Former Smoker, Hypertension                 and Sleep Apnea.  Sonographer:    Aron Baba Referring Phys: [id number] OLADAPO ADEFESO  Sonographer Comments: Image acquisition challenging due to COPD, Image acquisition challenging due to patient body habitus and Image acquisition challenging due to respiratory motion. IMPRESSIONS  1. Left ventricular ejection fraction, by estimation, is 60 to 65%. The left ventricle has normal function. The left ventricle has no regional wall motion abnormalities. There is mild left ventricular hypertrophy. Left ventricular diastolic function could not be evaluated. The E/e' is 25.  2.  Right ventricular systolic function is normal. The right ventricular size is normal. Tricuspid regurgitation signal is inadequate for assessing PA pressure.  3. Left atrial size was moderately dilated.  4. The mitral valve is normal in structure. No evidence of mitral valve regurgitation. No evidence of mitral stenosis.  5. The aortic valve has an indeterminant number of cusps. Aortic valve regurgitation is not visualized. No aortic stenosis is present.  6. Aortic dilatation noted. There is borderline dilatation of the aortic root, measuring 38 mm. There is mild dilatation of the ascending aorta, measuring 44 mm.  7. The inferior vena  cava is dilated in size with >50% respiratory variability, suggesting right atrial pressure of 8 mmHg. Comparison(s): No significant change from prior study. FINDINGS  Left Ventricle: Left ventricular ejection fraction, by estimation, is 60 to 65%. The left ventricle has normal function. The left ventricle has no regional wall motion abnormalities. The left ventricular internal cavity size was normal in size. There is  mild left ventricular hypertrophy. Left ventricular diastolic function could not be evaluated due to atrial fibrillation. Left ventricular diastolic function could not be evaluated. The E/e' is 25. Right Ventricle: The right ventricular size is normal. No increase in right ventricular wall thickness. Right ventricular systolic function is normal. Tricuspid regurgitation signal is inadequate for assessing PA pressure. Left Atrium: Left atrial size was moderately dilated. Right Atrium: Right atrial size was normal in size. Pericardium: Trivial pericardial effusion is present. Mitral Valve: The mitral valve is normal in structure. No evidence of mitral valve regurgitation. No evidence of mitral valve stenosis. Tricuspid Valve: The tricuspid valve is normal in structure. Tricuspid valve regurgitation is not demonstrated. No evidence of tricuspid stenosis. Aortic Valve: The  aortic valve has an indeterminant number of cusps. Aortic valve regurgitation is not visualized. No aortic stenosis is present. Aortic valve mean gradient measures 2.0 mmHg. Aortic valve peak gradient measures 3.1 mmHg. Aortic valve area, by VTI measures 1.66 cm. Pulmonic Valve: The pulmonic valve was not well visualized. Pulmonic valve regurgitation is not visualized. No evidence of pulmonic stenosis. Aorta: Aortic dilatation noted. There is borderline dilatation of the aortic root, measuring 38 mm. There is mild dilatation of the ascending aorta, measuring 44 mm. Venous: The inferior vena cava is dilated in size with greater than 50% respiratory variability, suggesting right atrial pressure of 8 mmHg. IAS/Shunts: No atrial level shunt detected by color flow Doppler.  LEFT VENTRICLE PLAX 2D LVIDd:         5.00 cm   Diastology LVIDs:         3.40 cm   LV e' medial:    4.13 cm/s LV PW:         1.40 cm   LV E/e' medial:  25.2 LV IVS:        1.10 cm   LV e' lateral:   9.14 cm/s LVOT diam:     1.60 cm   LV E/e' lateral: 11.4 LV SV:         27 LV SV Index:   12 LVOT Area:     2.01 cm  RIGHT VENTRICLE RV S prime:     8.87 cm/s TAPSE (M-mode): 2.0 cm LEFT ATRIUM           Index        RIGHT ATRIUM           Index LA diam:      3.00 cm 1.35 cm/m   RA Area:     18.30 cm LA Vol (A2C): 22.6 ml 10.20 ml/m  RA Volume:   49.10 ml  22.16 ml/m LA Vol (A4C): 96.3 ml 43.46 ml/m  AORTIC VALVE AV Area (Vmax):    1.89 cm AV Area (Vmean):   1.78 cm AV Area (VTI):     1.66 cm AV Vmax:           87.70 cm/s AV Vmean:          63.300 cm/s AV VTI:            0.164 m AV Peak Grad:      3.1  mmHg AV Mean Grad:      2.0 mmHg LVOT Vmax:         82.53 cm/s LVOT Vmean:        55.900 cm/s LVOT VTI:          0.136 m LVOT/AV VTI ratio: 0.83  AORTA Ao Root diam: 3.80 cm Ao Asc diam:  4.40 cm MITRAL VALVE MV Area (PHT): 4.63 cm     SHUNTS MV Decel Time: 164 msec     Systemic VTI:  0.14 m MV E velocity: 104.00 cm/s  Systemic Diam: 1.60 cm MV A  velocity: 38.90 cm/s MV E/A ratio:  2.67 Vishnu Priya Mallipeddi Electronically signed by Winfield Rast Mallipeddi Signature Date/Time: 09/18/2022/3:51:31 PM    Final    DG Chest 2 View  Result Date: 09/16/2022 CLINICAL DATA:  Edema. Leg swelling and chest tightness for a week. Shortness of breath. Former smoker. EXAM: CHEST - 2 VIEW COMPARISON:  09/09/2022 FINDINGS: Shallow inspiration. Mild cardiac enlargement. No vascular congestion. Linear scarring in the left mid lung is unchanged since prior study. No evidence of developing consolidation, edema, or effusion. Mediastinal contours appear intact. No pneumothorax. Calcification of the aorta. Postoperative changes in the cervical spine. Degenerative changes in the shoulders. IMPRESSION: 1. Stable appearance of linear scarring in the left mid lung. 2. No evidence of active pulmonary disease. Electronically Signed   By: Burman Nieves M.D.   On: 09/16/2022 16:35      LOS: 1 day    Jacquelin Hawking, MD [facility] 09/18/2022, 4:14 PM   If 7PM-7AM, please contact night-coverage [email or web address]

## 2022-09-18 NOTE — Hospital Course (Addendum)
Ann [name] is a 77 y.o. female with a history of atrial fibrillation, COPD on oxygen, hyperlipidemia, GERD, diabetes mellitus type 2, CAD, hypothyroidism. Patient presented secondary to leg swelling and shortness of breath with exertion and was found to have evidence of acute heart failure in addition to left leg cellulitis. Hospitalization complicated by atrial fibrillation with RVR which improved with home diltiazem and addition of bisoprolol.

## 2022-09-18 NOTE — TOC Initial Note (Signed)
Transition of Care [facility]) - Initial/Assessment Note    Patient Details  Name: Ann [name] MRN: [id number] Date of Birth: 12-13-1945  Transition of Care [facility]) CM/SW Contact:    Karn Cassis, LCSW Phone Number: 09/18/2022, 8:56 AM  Clinical Narrative:  Pt admitted with acute on chronic diastolic CHF. Assessment completed due to high risk readmission score. Pt reports she lives with her husband, 2 sons, daughter-in-law, 2 grandsons, granddaughter-in-law, and great grandchild. She is independent with ADLs and family does most household tasks. Pt is on 2L home O2 ([employer]). She plans to return home when medically stable. No needs reported at this time. TOC will continue to follow.                   Expected Discharge Plan: Home/Self Care Barriers to Discharge: Continued Medical Work up   Patient Goals and CMS Choice Patient states their goals for this hospitalization and ongoing recovery are:: return home   Choice offered to / list presented to : Patient [place] ownership interest in [facility].provided to::  (n/a)    Expected Discharge Plan and Services In-house Referral: Clinical Social Work     Living arrangements for the past 2 months: Single Family Home                                      Prior Living Arrangements/Services Living arrangements for the past 2 months: Single Family Home Lives with:: Relatives, Spouse, Adult Children Patient language and need for interpreter reviewed:: Yes Do you feel safe going back to the place where you live?: Yes      Need for Family Participation in Patient Care: No (Comment)   Current home services: DME (cane, walker, home O2) Criminal Activity/Legal Involvement Pertinent to Current Situation/Hospitalization: No - Comment as needed  Activities of Daily Living Home Assistive Devices/Equipment: CBG Meter, Eyeglasses, Dentures (specify type), Wheelchair, Environmental consultant (specify type), Shower chair with back,  Bedside commode/3-in-1 ADL Screening (condition at time of admission) Patient's cognitive ability adequate to safely complete daily activities?: Yes Is the patient deaf or have difficulty hearing?: No Does the patient have difficulty seeing, even when wearing glasses/contacts?: No Does the patient have difficulty concentrating, remembering, or making decisions?: No Patient able to express need for assistance with ADLs?: Yes Does the patient have difficulty dressing or bathing?: No Independently performs ADLs?: Yes (appropriate for developmental age) Does the patient have difficulty walking or climbing stairs?: Yes Weakness of Legs: Both Weakness of Arms/Hands: None  Permission Sought/Granted                  Emotional Assessment     Affect (typically observed): Appropriate Orientation: : Oriented to Self, Oriented to Place, Oriented to  Time, Oriented to Situation Alcohol / Substance Use: Not Applicable Psych Involvement: No (comment)  Admission diagnosis:  Acute on chronic diastolic CHF (congestive heart failure) [I50.33] Patient Active Problem List   Diagnosis Date Noted   Elevated brain natriuretic peptide (BNP) level 09/17/2022   Chronic respiratory failure with hypoxia 04/30/2022   History of colostomy reversal 12/18/2020   Abnormality of gait 07/04/2020   Colostomy in place 06/13/2020   Colostomy, retracted 06/13/2020   Labile blood glucose    Supplemental oxygen dependent    Steroid-induced hyperglycemia    Controlled type 2 diabetes mellitus with hyperglycemia, with long-term current use of insulin    Hypoalbuminemia due  to protein-calorie malnutrition    Chronic diastolic congestive heart failure    Acute blood loss anemia    Debility 05/25/2020   Sigmoid Diverticulitis with Perforation-- 05/15/2020   Diverticulitis of large intestine with perforation with bleeding    Sepsis Due to Sigmoid Diverticulitis with Perforation 05/14/2020   Acute GI bleeding  05/14/2020   Acute on chronic anemia-Due to Acute Blood Loss 05/14/2020   Acute respiratory failure with hypoxia 01/24/2020   Atrial fibrillation with RVR 01/23/2020   Anxiety    Uncontrolled type 2 diabetes mellitus with hyperglycemia, with long-term current use of insulin    Weakness 12/10/2019   Acute pain of left knee 08/31/2019   History of DVT (deep vein thrombosis)    Coronary artery disease of native artery of native heart with stable angina pectoris    COPD GOLD 0 / 02 dep / prob Cor pulmonale    Atrial fibrillation with rapid ventricular response 07/13/2019   COPD exacerbation 04/04/2018   Comprehensive diabetic foot examination, type 2 DM, encounter for 04/04/2018   PAF (paroxysmal atrial fibrillation) 04/04/2018   Constipation by delayed colonic transit 10/23/2016   Fatty liver 12/11/2015   GERD without esophagitis 07/14/2015   Elevated troponin 03/15/2013   VRE (vancomycin resistant enterococcus) culture positive 10/10/2012   Nonsustained ventricular tachycardia 10/10/2012   Cellulitis of arm, right 10/06/2012   Hydronephrosis 09/15/2012   Abdominal mass, right lower quadrant 09/10/2012   Microcytic anemia 08/22/2012   Acute on chronic diastolic heart failure (HCC) 08/06/2012   UTI (urinary tract infection) 07/30/2012   Atrial fibrillation, chronic 07/02/2012   Vitamin B12 deficiency 06/28/2012   Sinusitis chronic, frontal 06/28/2012   Morbid obesity with BMI of 50.0-59.9, adult 06/28/2012   Chronic kidney disease, stage III (moderate) (HCC) 06/28/2012   ARF (acute renal failure) 06/27/2012   DOE (dyspnea on exertion) 06/27/2012   Adrenal insufficiency (HCC) 01/07/2012   Uncontrolled type 2 diabetes mellitus with hyperglycemia 04/19/2011   Chronic diastolic heart failure (HCC) 04/19/2011   GANGLION OF TENDON SHEATH 02/21/2010   Acquired hypothyroidism 12/01/2008   Mixed hyperlipidemia 12/01/2008   Essential hypertension 12/01/2008   Coronary atherosclerosis  12/01/2008   RENAL FAILURE, CHRONIC 12/01/2008   PCP:  Benetta Spar, MD Pharmacy:   [facility] [phone] - [place], [place] - [address] [place] [address] Phone: [phone] Fax: [phone]  [facility] - [place], [place] - [address] [facility] [address] [place] [address] [place] [address] Phone: [phone] Fax: [phone]     Social Determinants of Health (SDOH) Social History: SDOH Screenings   Food Insecurity: No Food Insecurity (09/17/2022)  Housing: Low Risk  (09/17/2022)  Transportation Needs: No Transportation Needs (09/17/2022)  Utilities: Not At Risk (09/17/2022)  Depression (PHQ2-9): Low Risk  (07/04/2020)  Tobacco Use: Medium Risk (09/16/2022)   SDOH Interventions:     Readmission Risk Interventions    09/18/2022    8:34 AM 12/19/2020    4:03 PM 05/16/2020    2:33 PM  Readmission Risk Prevention Plan  Transportation Screening Complete Complete Complete  HRI or Home Care Consult Complete    Social Work Consult for Recovery Care Planning/Counseling Complete    Palliative Care Screening Not Applicable    Medication Review Oceanographer) Complete Complete Complete  HRI or Home Care Consult  Complete Complete  SW Recovery Care/Counseling Consult  Complete Complete  Palliative Care Screening  Not  Applicable Not Applicable  Skilled Nursing Facility  Not Applicable Patient Refused

## 2022-09-18 NOTE — Progress Notes (Signed)
   09/18/22 1230  Vitals  BP 109/80  Pulse Rate 100  Level of Consciousness  Level of Consciousness Alert   After mg IV metoprolol given

## 2022-09-18 NOTE — Progress Notes (Signed)
   09/18/22 1112  Vitals  BP 136/82  MAP (mmHg) 93  Pulse Rate (!) 131  Level of Consciousness  Level of Consciousness Alert  MEWS COLOR  MEWS Score Color Yellow  Oxygen Therapy  SpO2 94 %  O2 Device Nasal Cannula  O2 Flow Rate (L/min) 2 L/min  MEWS Score  MEWS Temp 0  MEWS Systolic 0  MEWS Pulse 3  MEWS RR 0  MEWS LOC 0  MEWS Score 3  Provider Notification  Provider Name/Title Jacquelin [facility] MD  Date Provider Notified 09/18/22  Time Provider Notified 1119  Method of Notification Face-to-face  Notification Reason Other (Comment) (High HR)  Provider response At bedside  Date of Provider Response 09/18/22  Time of Provider Response 1119   PT HR between 130-150s with ambulating from chair to bed. Pts MD was bedside and ordered  oral. Echo bedside and has to do Echo at a later time.

## 2022-09-18 NOTE — Progress Notes (Signed)
Pt care in progress. 

## 2022-09-19 DIAGNOSIS — R7989 Other specified abnormal findings of blood chemistry: Secondary | ICD-10-CM | POA: Diagnosis not present

## 2022-09-19 LAB — BASIC METABOLIC PANEL
Anion gap: 10 (ref 5–15)
BUN: 19 mg/dL (ref 8–23)
CO2: 28 mmol/L (ref 22–32)
Calcium: 8.4 mg/dL — ABNORMAL LOW (ref 8.9–10.3)
Chloride: 100 mmol/L (ref 98–111)
Creatinine, Ser: 0.99 mg/dL (ref 0.44–1.00)
GFR, Estimated: 59 mL/min — ABNORMAL LOW (ref >60)
Glucose, Bld: 147 mg/dL — ABNORMAL HIGH (ref 70–99)
Potassium: 3.2 mmol/L — ABNORMAL LOW (ref 3.5–5.1)
Sodium: 138 mmol/L (ref 135–145)

## 2022-09-19 LAB — GLUCOSE, CAPILLARY
Glucose-Capillary: 151 mg/dL — ABNORMAL HIGH (ref 70–99)
Glucose-Capillary: 209 mg/dL — ABNORMAL HIGH (ref 70–99)
Glucose-Capillary: 166 mg/dL — ABNORMAL HIGH (ref 70–99)
Glucose-Capillary: 200 mg/dL — ABNORMAL HIGH (ref 70–99)

## 2022-09-19 NOTE — Progress Notes (Addendum)
PROGRESS NOTE    HARSHITA BERNALES  ZOX:[id number] DOB: May 08, 1946 DOA: 09/16/2022 PCP: Benetta Spar, MD   Brief Narrative: Ann [name] is a 77 y.o. female with a history of atrial fibrillation, COPD on oxygen, hyperlipidemia, GERD, diabetes mellitus type 2, CAD, hypothyroidism. Patient presented secondary to leg swelling and shortness of breath with exertion and was found to have evidence of acute heart failure in addition to left leg cellulitis. Hospitalization complicated by atrial fibrillation with RVR.   Assessment and Plan:  Acute on chronic diastolic heart failure Last Transthoracic Echocardiogram from 2021 significant for an LVEF of 60-65%. Patient without evidence of acute pulmonary disease on imaging but with concern for acute illness from symptomatology. Patient started on Lasix IV. Transthoracic Echocardiogram on 4/24 significant for preserved LVEF. -Continue Lasix IV  Left LE cellulitis Empiric Ceftriaxone IV started. Significant improvement from initial margins. -Continue Ceftriaxone  Atrial fibrillation with RVR In setting of acute illness but also with home Cardizem decreased on admission. TSH from 4/15 this month of 4.260. Patient started on Cardizem CD 180 mg on admission. Cardizem 30 mg BID added for rate control without improvement of rate. Metoprolol 5 mg IV added with improvement of heart rate to 80s-100s. RVR resolved. -Continue home Cardizem CD 240 mg daily  Microcytic anemia Chronic. Stable.  Elevated troponin Minimal elevation with flat trend of 32 > 32. No chest pain.  COPD Stable.  Chronic respiratory failure with hypoxia Stable. -Continue supplemental oxygen  Hyperlipidemia -Continue Lipitor  GERD -Continue Protonix  Diabetes mellitus type 2 Controlled with hemoglobin A1C of 7.8%. Patient is managed on Humalog as an outpatient. Patient started on Semglee and SSI on admission. -Continue Semglee and SSI  CAD Noted. -Continue  Eliquis/Lipitor  Hypothyroidism -Continue Synthroid  Morbid obesity Estimated body mass index is 48.82 kg/m as calculated from the following:   Height as of this encounter:  (1.6 m).   Weight as of this encounter: 125 kg.  DVT prophylaxis: Eliquis Code Status:   Code Status: DNR Family Communication: None at bedside Disposition Plan: Discharge home with home health services likely in 1-2 days pending continued diuresis and continued improvement of cellulitis   Consultants:  None  Procedures:  4/24: Transthoracic Echocardiogram   Antimicrobials: Ceftriaxone IV    Subjective: No issues this morning. Feels well. No chest pain or dyspnea. She thinks her legs/feet are looking better.  Objective: BP (!) 126/94 (BP Location: Right Arm)   Pulse 61   Temp 98.1 F (36.7 C) (Oral)   Resp 19   Ht  (1.6 m)   Wt 125 kg   SpO2 97%   BMI 48.82 kg/m   Examination:  General exam: Appears calm and comfortable Respiratory system: Clear to auscultation. Respiratory effort normal. Cardiovascular system: S1 & S2 heard, RRR. No murmurs. Gastrointestinal system: Abdomen is nondistended, soft and nontender. Normal bowel sounds heard. Central nervous system: Alert and oriented. No focal neurological deficits. Musculoskeletal: No significant LE edema. No calf tenderness Skin: No cyanosis. Mild erythema of LLE receding from initial markings Psychiatry: Judgement and insight appear normal. Mood & affect appropriate.    Data Reviewed: I have personally reviewed following labs and imaging studies  CBC Lab Results  Component Value Date   WBC 9.1 09/18/2022   RBC 4.52 09/18/2022   HGB 9.4 (L) 09/18/2022   HCT 33.4 (L) 09/18/2022   MCV 73.9 (L) 09/18/2022   MCH 20.8 (L) 09/18/2022   PLT 254 09/18/2022  MCHC 28.1 (L) 09/18/2022   RDW 19.7 (H) 09/18/2022   LYMPHSABS 1.4 09/09/2022   MONOABS 0.8 12/23/2020   EOSABS 0.2 09/09/2022   BASOSABS 0.0 09/09/2022     Last  metabolic panel Lab Results  Component Value Date   NA 138 09/19/2022   K 3.2 (L) 09/19/2022   CL 100 09/19/2022   CO2 28 09/19/2022   BUN 19 09/19/2022   CREATININE 0.99 09/19/2022   GLUCOSE 147 (H) 09/19/2022   GFRNONAA 59 (L) 09/19/2022   GFRAA 85 04/11/2020   CALCIUM 8.4 (L) 09/19/2022   PHOS 4.3 09/17/2022   PROT 7.0 09/18/2022   ALBUMIN 3.3 (L) 09/18/2022   LABGLOB 3.2 06/07/2022   AGRATIO 1.2 06/07/2022   BILITOT 0.5 09/18/2022   ALKPHOS 91 09/18/2022   AST 17 09/18/2022   ALT 16 09/18/2022   ANIONGAP 10 09/19/2022    GFR: Estimated Creatinine Clearance: 61.2 mL/min (by C-G formula based on SCr of 0.99 mg/dL).  No results found for this or any previous visit (from the past 240 hour(s)).    Radiology Studies: ECHOCARDIOGRAM COMPLETE  Result Date: 09/18/2022    ECHOCARDIOGRAM REPORT   Patient Name:   Ann [name] Date of Exam: 09/18/2022 Medical Rec #:  [id number]        Height:       63.0 in Accession #:    [id number]       Weight:       275.6 lb Date of Birth:  1946/01/18         BSA:          2.216 m Patient Age:    77 years         BP:           109/80 mmHg Patient Gender: F                HR:           96 bpm. [facility] Procedure: 2D Echo, Cardiac Doppler and Color Doppler Indications:    CHF-Acute Diastolic I50.31  History:        Patient has prior history of Echocardiogram examinations, most                 recent 01/24/2020. CAD, COPD, Arrythmias:Atrial Fibrillation;                 Risk Factors:Diabetes, Dyslipidemia, Former Smoker, Hypertension                 and Sleep Apnea.  Sonographer:    Aron Baba Referring Phys: [id number] OLADAPO ADEFESO  Sonographer Comments: Image acquisition challenging due to COPD, Image acquisition challenging due to patient body habitus and Image acquisition challenging due to respiratory motion. IMPRESSIONS  1. Left ventricular ejection fraction, by estimation, is 60 to 65%. The left ventricle has normal function. The  left ventricle has no regional wall motion abnormalities. There is mild left ventricular hypertrophy. Left ventricular diastolic function could not be evaluated. The E/e' is 25.  2. Right ventricular systolic function is normal. The right ventricular size is normal. Tricuspid regurgitation signal is inadequate for assessing PA pressure.  3. Left atrial size was moderately dilated.  4. The mitral valve is normal in structure. No evidence of mitral valve regurgitation. No evidence of mitral stenosis.  5. The aortic valve has an indeterminant number of cusps. Aortic valve regurgitation is not visualized. No aortic stenosis is present.  6. Aortic dilatation noted. There  is borderline dilatation of the aortic root, measuring 38 mm. There is mild dilatation of the ascending aorta, measuring 44 mm.  7. The inferior vena cava is dilated in size with >50% respiratory variability, suggesting right atrial pressure of 8 mmHg. Comparison(s): No significant change from prior study. FINDINGS  Left Ventricle: Left ventricular ejection fraction, by estimation, is 60 to 65%. The left ventricle has normal function. The left ventricle has no regional wall motion abnormalities. The left ventricular internal cavity size was normal in size. There is  mild left ventricular hypertrophy. Left ventricular diastolic function could not be evaluated due to atrial fibrillation. Left ventricular diastolic function could not be evaluated. The E/e' is 25. Right Ventricle: The right ventricular size is normal. No increase in right ventricular wall thickness. Right ventricular systolic function is normal. Tricuspid regurgitation signal is inadequate for assessing PA pressure. Left Atrium: Left atrial size was moderately dilated. Right Atrium: Right atrial size was normal in size. Pericardium: Trivial pericardial effusion is present. Mitral Valve: The mitral valve is normal in structure. No evidence of mitral valve regurgitation. No evidence of mitral  valve stenosis. Tricuspid Valve: The tricuspid valve is normal in structure. Tricuspid valve regurgitation is not demonstrated. No evidence of tricuspid stenosis. Aortic Valve: The aortic valve has an indeterminant number of cusps. Aortic valve regurgitation is not visualized. No aortic stenosis is present. Aortic valve mean gradient measures 2.0 mmHg. Aortic valve peak gradient measures 3.1 mmHg. Aortic valve area, by VTI measures 1.66 cm. Pulmonic Valve: The pulmonic valve was not well visualized. Pulmonic valve regurgitation is not visualized. No evidence of pulmonic stenosis. Aorta: Aortic dilatation noted. There is borderline dilatation of the aortic root, measuring 38 mm. There is mild dilatation of the ascending aorta, measuring 44 mm. Venous: The inferior vena cava is dilated in size with greater than 50% respiratory variability, suggesting right atrial pressure of 8 mmHg. IAS/Shunts: No atrial level shunt detected by color flow Doppler.  LEFT VENTRICLE PLAX 2D LVIDd:         5.00 cm   Diastology LVIDs:         3.40 cm   LV e' medial:    4.13 cm/s LV PW:         1.40 cm   LV E/e' medial:  25.2 LV IVS:        1.10 cm   LV e' lateral:   9.14 cm/s LVOT diam:     1.60 cm   LV E/e' lateral: 11.4 LV SV:         27 LV SV Index:   12 LVOT Area:     2.01 cm  RIGHT VENTRICLE RV S prime:     8.87 cm/s TAPSE (M-mode): 2.0 cm LEFT ATRIUM           Index        RIGHT ATRIUM           Index LA diam:      3.00 cm 1.35 cm/m   RA Area:     18.30 cm LA Vol (A2C): 22.6 ml 10.20 ml/m  RA Volume:   49.10 ml  22.16 ml/m LA Vol (A4C): 96.3 ml 43.46 ml/m  AORTIC VALVE AV Area (Vmax):    1.89 cm AV Area (Vmean):   1.78 cm AV Area (VTI):     1.66 cm AV Vmax:           87.70 cm/s AV Vmean:  63.300 cm/s AV VTI:            0.164 m AV Peak Grad:      3.1 mmHg AV Mean Grad:      2.0 mmHg LVOT Vmax:         82.53 cm/s LVOT Vmean:        55.900 cm/s LVOT VTI:          0.136 m LVOT/AV VTI ratio: 0.83  AORTA Ao Root diam:  3.80 cm Ao Asc diam:  4.40 cm MITRAL VALVE MV Area (PHT): 4.63 cm     SHUNTS MV Decel Time: 164 msec     Systemic VTI:  0.14 m MV E velocity: 104.00 cm/s  Systemic Diam: 1.60 cm MV A velocity: 38.90 cm/s MV E/A ratio:  2.67 Vishnu Priya Mallipeddi Electronically signed by Winfield Rast Mallipeddi Signature Date/Time: 09/18/2022/3:51:31 PM    Final       LOS: 2 days    Jacquelin [facility], MD [facility] 09/19/2022, 11:34 AM   If 7PM-7AM, please contact night-coverage [email or web address]

## 2022-09-20 ENCOUNTER — Encounter (HOSPITAL_COMMUNITY): Payer: Self-pay | Admitting: Internal Medicine

## 2022-09-20 DIAGNOSIS — I5033 Acute on chronic diastolic (congestive) heart failure: Secondary | ICD-10-CM | POA: Diagnosis not present

## 2022-09-20 DIAGNOSIS — I4819 Other persistent atrial fibrillation: Secondary | ICD-10-CM | POA: Diagnosis not present

## 2022-09-20 DIAGNOSIS — R7989 Other specified abnormal findings of blood chemistry: Secondary | ICD-10-CM | POA: Diagnosis not present

## 2022-09-20 DIAGNOSIS — J9611 Chronic respiratory failure with hypoxia: Secondary | ICD-10-CM | POA: Diagnosis not present

## 2022-09-20 DIAGNOSIS — E876 Hypokalemia: Secondary | ICD-10-CM | POA: Diagnosis not present

## 2022-09-20 LAB — BASIC METABOLIC PANEL
Anion gap: 10 (ref 5–15)
BUN: 18 mg/dL (ref 8–23)
CO2: 28 mmol/L (ref 22–32)
Calcium: 9 mg/dL (ref 8.9–10.3)
Chloride: 100 mmol/L (ref 98–111)
Creatinine, Ser: 0.86 mg/dL (ref 0.44–1.00)
GFR, Estimated: >60 mL/min (ref >60)
Glucose, Bld: 157 mg/dL — ABNORMAL HIGH (ref 70–99)
Potassium: 3.2 mmol/L — ABNORMAL LOW (ref 3.5–5.1)
Sodium: 138 mmol/L (ref 135–145)

## 2022-09-20 LAB — GLUCOSE, CAPILLARY
Glucose-Capillary: 147 mg/dL — ABNORMAL HIGH (ref 70–99)
Glucose-Capillary: 194 mg/dL — ABNORMAL HIGH (ref 70–99)
Glucose-Capillary: 203 mg/dL — ABNORMAL HIGH (ref 70–99)
Glucose-Capillary: 197 mg/dL — ABNORMAL HIGH (ref 70–99)

## 2022-09-20 LAB — MAGNESIUM: Magnesium: 1.9 mg/dL (ref 1.7–2.4)

## 2022-09-20 MED ORDER — POTASSIUM CHLORIDE CRYS ER 20 MEQ PO TBCR
40.0000 meq | EXTENDED_RELEASE_TABLET | ORAL | Status: AC
  Administered 2022-09-20 (×2): 40 meq via ORAL
  Filled 2022-09-20 (×2): qty 2

## 2022-09-20 NOTE — Progress Notes (Signed)
PROGRESS NOTE    Ann [name]  ZOX:[id number] DOB: 04-Jan-1946 DOA: 09/16/2022 PCP: Benetta Spar, MD   Brief Narrative: Ann [name] is a 77 y.o. female with a history of atrial fibrillation, COPD on oxygen, hyperlipidemia, GERD, diabetes mellitus type 2, CAD, hypothyroidism. Patient presented secondary to leg swelling and shortness of breath with exertion and was found to have evidence of acute heart failure in addition to left leg cellulitis. Hospitalization complicated by atrial fibrillation with RVR.   Assessment and Plan:  Acute on chronic diastolic heart failure Last Transthoracic Echocardiogram from 2021 significant for an LVEF of 60-65%. Patient without evidence of acute pulmonary disease on imaging but with concern for acute illness from symptomatology. Patient started on Lasix IV. Transthoracic Echocardiogram on 4/24 significant for preserved LVEF. -Continue Lasix IV  Left LE cellulitis Empiric Ceftriaxone IV started. Significant improvement from initial margins. -Continue Ceftriaxone  Atrial fibrillation with RVR In setting of acute illness but also with home Cardizem decreased on admission. TSH from 4/15 this month of 4.260. Patient started on Cardizem CD 180 mg on admission. Cardizem 30 mg BID added for rate control without improvement of rate. Metoprolol 5 mg IV added with improvement of heart rate to 80s-100s. RVR resolved. -Continue home Cardizem CD 240 mg daily -Cardiology consult to help with better rate control to allow for safer discharge plan  Microcytic anemia Chronic. Stable.  Elevated troponin Minimal elevation with flat trend of 32 > 32. No chest pain.  COPD Stable.  Chronic respiratory failure with hypoxia Stable. -Continue supplemental oxygen  Hyperlipidemia -Continue Lipitor  GERD -Continue Protonix  Diabetes mellitus type 2 Controlled with hemoglobin A1C of 7.8%. Patient is managed on Humalog as an outpatient. Patient  started on Semglee and SSI on admission. -Continue Semglee and SSI  CAD Noted. -Continue Eliquis/Lipitor  Hypothyroidism -Continue Synthroid  Morbid obesity Estimated body mass index is 48.11 kg/m as calculated from the following:   Height as of this encounter: 5\' 3"  (1.6 m).   Weight as of this encounter: 123.2 kg.  DVT prophylaxis: Eliquis Code Status:   Code Status: DNR Family Communication: None at bedside Disposition Plan: Discharge home with home health services likely in 1 days pending continued diuresis and continued improvement of cellulitis in addition to better control of heart rate if able.   Consultants:  None  Procedures:  4/24: Transthoracic Echocardiogram   Antimicrobials: Ceftriaxone IV    Subjective: No concerns this morning except for some congestion.  Objective: BP 115/61   Pulse 71   Temp 98.5 F (36.9 C) (Oral)   Resp 20   Ht 5\' 3"  (1.6 m)   Wt 123.2 kg   SpO2 99%   BMI 48.11 kg/m   Examination:  General exam: Appears calm and comfortable Respiratory system: Clear to auscultation. Respiratory effort normal. Cardiovascular system: S1 & S2 heard, irregular rhythm with increased rate. No murmurs, rubs, gallops or clicks. Gastrointestinal system: Abdomen is nondistended, soft and nontender. Normal bowel sounds heard. Central nervous system: Alert and oriented. No focal neurological deficits. Musculoskeletal: No calf tenderness Psychiatry: Judgement and insight appear normal. Mood & affect appropriate.    Data Reviewed: I have personally reviewed following labs and imaging studies  CBC Lab Results  Component Value Date   WBC 9.1 09/18/2022   RBC 4.52 09/18/2022   HGB 9.4 (L) 09/18/2022   HCT 33.4 (L) 09/18/2022   MCV 73.9 (L) 09/18/2022   MCH 20.8 (L) 09/18/2022   PLT 254 09/18/2022  MCHC 28.1 (L) 09/18/2022   RDW 19.7 (H) 09/18/2022   LYMPHSABS 1.4 09/09/2022   MONOABS 0.8 12/23/2020   EOSABS 0.2 09/09/2022   BASOSABS 0.0  09/09/2022     Last metabolic panel Lab Results  Component Value Date   NA 138 09/20/2022   K 3.2 (L) 09/20/2022   CL 100 09/20/2022   CO2 28 09/20/2022   BUN 18 09/20/2022   CREATININE 0.86 09/20/2022   GLUCOSE 157 (H) 09/20/2022   GFRNONAA >60 09/20/2022   GFRAA 85 04/11/2020   CALCIUM 9.0 09/20/2022   PHOS 4.3 09/17/2022   PROT 7.0 09/18/2022   ALBUMIN 3.3 (L) 09/18/2022   LABGLOB 3.2 06/07/2022   AGRATIO 1.2 06/07/2022   BILITOT 0.5 09/18/2022   ALKPHOS 91 09/18/2022   AST 17 09/18/2022   ALT 16 09/18/2022   ANIONGAP 10 09/20/2022    GFR: Estimated Creatinine Clearance: 69.8 mL/min (by C-G formula based on SCr of 0.86 mg/dL).  No results found for this or any previous visit (from the past 240 hour(s)).    Radiology Studies: ECHOCARDIOGRAM COMPLETE  Result Date: 09/18/2022    ECHOCARDIOGRAM REPORT   Patient Name:   Ann [name] Date of Exam: 09/18/2022 Medical Rec #:  [id number]        Height:       63.0 in Accession #:    [id number]       Weight:       275.6 lb Date of Birth:  11-Feb-1946         BSA:          2.216 m Patient Age:    77 years         BP:           109/80 mmHg Patient Gender: F                HR:           96 bpm. [facility] Procedure: 2D Echo, Cardiac Doppler and Color Doppler Indications:    CHF-Acute Diastolic I50.31  History:        Patient has prior history of Echocardiogram examinations, most                 recent 01/24/2020. CAD, COPD, Arrythmias:Atrial Fibrillation;                 Risk Factors:Diabetes, Dyslipidemia, Former Smoker, Hypertension                 and Sleep Apnea.  Sonographer:    Aron Baba Referring Phys: [id number] OLADAPO ADEFESO  Sonographer Comments: Image acquisition challenging due to COPD, Image acquisition challenging due to patient body habitus and Image acquisition challenging due to respiratory motion. IMPRESSIONS  1. Left ventricular ejection fraction, by estimation, is 60 to 65%. The left ventricle has  normal function. The left ventricle has no regional wall motion abnormalities. There is mild left ventricular hypertrophy. Left ventricular diastolic function could not be evaluated. The E/e' is 25.  2. Right ventricular systolic function is normal. The right ventricular size is normal. Tricuspid regurgitation signal is inadequate for assessing PA pressure.  3. Left atrial size was moderately dilated.  4. The mitral valve is normal in structure. No evidence of mitral valve regurgitation. No evidence of mitral stenosis.  5. The aortic valve has an indeterminant number of cusps. Aortic valve regurgitation is not visualized. No aortic stenosis is present.  6. Aortic dilatation noted. There is borderline  dilatation of the aortic root, measuring 38 mm. There is mild dilatation of the ascending aorta, measuring 44 mm.  7. The inferior vena cava is dilated in size with >50% respiratory variability, suggesting right atrial pressure of 8 mmHg. Comparison(s): No significant change from prior study. FINDINGS  Left Ventricle: Left ventricular ejection fraction, by estimation, is 60 to 65%. The left ventricle has normal function. The left ventricle has no regional wall motion abnormalities. The left ventricular internal cavity size was normal in size. There is  mild left ventricular hypertrophy. Left ventricular diastolic function could not be evaluated due to atrial fibrillation. Left ventricular diastolic function could not be evaluated. The E/e' is 25. Right Ventricle: The right ventricular size is normal. No increase in right ventricular wall thickness. Right ventricular systolic function is normal. Tricuspid regurgitation signal is inadequate for assessing PA pressure. Left Atrium: Left atrial size was moderately dilated. Right Atrium: Right atrial size was normal in size. Pericardium: Trivial pericardial effusion is present. Mitral Valve: The mitral valve is normal in structure. No evidence of mitral valve regurgitation.  No evidence of mitral valve stenosis. Tricuspid Valve: The tricuspid valve is normal in structure. Tricuspid valve regurgitation is not demonstrated. No evidence of tricuspid stenosis. Aortic Valve: The aortic valve has an indeterminant number of cusps. Aortic valve regurgitation is not visualized. No aortic stenosis is present. Aortic valve mean gradient measures 2.0 mmHg. Aortic valve peak gradient measures 3.1 mmHg. Aortic valve area, by VTI measures 1.66 cm. Pulmonic Valve: The pulmonic valve was not well visualized. Pulmonic valve regurgitation is not visualized. No evidence of pulmonic stenosis. Aorta: Aortic dilatation noted. There is borderline dilatation of the aortic root, measuring 38 mm. There is mild dilatation of the ascending aorta, measuring 44 mm. Venous: The inferior vena cava is dilated in size with greater than 50% respiratory variability, suggesting right atrial pressure of 8 mmHg. IAS/Shunts: No atrial level shunt detected by color flow Doppler.  LEFT VENTRICLE PLAX 2D LVIDd:         5.00 cm   Diastology LVIDs:         3.40 cm   LV e' medial:    4.13 cm/s LV PW:         1.40 cm   LV E/e' medial:  25.2 LV IVS:        1.10 cm   LV e' lateral:   9.14 cm/s LVOT diam:     1.60 cm   LV E/e' lateral: 11.4 LV SV:         27 LV SV Index:   12 LVOT Area:     2.01 cm  RIGHT VENTRICLE RV S prime:     8.87 cm/s TAPSE (M-mode): 2.0 cm LEFT ATRIUM           Index        RIGHT ATRIUM           Index LA diam:      3.00 cm 1.35 cm/m   RA Area:     18.30 cm LA Vol (A2C): 22.6 ml 10.20 ml/m  RA Volume:   49.10 ml  22.16 ml/m LA Vol (A4C): 96.3 ml 43.46 ml/m  AORTIC VALVE AV Area (Vmax):    1.89 cm AV Area (Vmean):   1.78 cm AV Area (VTI):     1.66 cm AV Vmax:           87.70 cm/s AV Vmean:          63.300 cm/s  AV VTI:            0.164 m AV Peak Grad:      3.1 mmHg AV Mean Grad:      2.0 mmHg LVOT Vmax:         82.53 cm/s LVOT Vmean:        55.900 cm/s LVOT VTI:          0.136 m LVOT/AV VTI ratio: 0.83   AORTA Ao Root diam: 3.80 cm Ao Asc diam:  4.40 cm MITRAL VALVE MV Area (PHT): 4.63 cm     SHUNTS MV Decel Time: 164 msec     Systemic VTI:  0.14 m MV E velocity: 104.00 cm/s  Systemic Diam: 1.60 cm MV A velocity: 38.90 cm/s MV E/A ratio:  2.67 Vishnu Priya Mallipeddi Electronically signed by Winfield Rast Mallipeddi Signature Date/Time: 09/18/2022/3:51:31 PM    Final       LOS: 3 days    Jacquelin [facility], MD [facility] 09/20/2022, 12:43 PM   If 7PM-7AM, please contact night-coverage [email or web address]

## 2022-09-20 NOTE — TOC Transition Note (Signed)
Transition of Care [facility]) - CM/SW Discharge Note   Patient Details  Name: Ann [name] MRN: [id number] Date of Birth: 11-22-1945  Transition of [facility]) CM/SW Contact:  Elliot Gault, LCSW Phone Number: 09/20/2022, 10:42 AM   Clinical Narrative:     TOC following. Per MD, pt may dc later today vs tomorrow. Spoke with pt at bedside to review dc planning. Discussed PT Recommendation for HHPT. Pt states that she does not believe she needs this and does not want TOC to refer. Pt aware to let her RN know if she changes her mind and TOC will return.  Pt states family will transport at dc and she will have them bring one of her portable O2 tanks.  No other TOC needs for dc.  Final next level of care: Home/Self Care Barriers to Discharge: Barriers Resolved   Patient Goals and CMS Choice   Choice offered to / list presented to : Patient  Discharge Placement                         Discharge Plan and Services Additional resources added to the After Visit Summary for   In-house Referral: Clinical Social Work                        HH Arranged: Refused HH          Social Determinants of Health (SDOH) Interventions SDOH Screenings   Food Insecurity: No Food Insecurity (09/17/2022)  Housing: Low Risk  (09/17/2022)  Transportation Needs: No Transportation Needs (09/17/2022)  Utilities: Not At Risk (09/17/2022)  Depression (PHQ2-9): Low Risk  (07/04/2020)  Tobacco Use: Medium Risk (09/16/2022)     Readmission Risk Interventions    09/18/2022    8:34 AM 12/19/2020    4:03 PM 05/16/2020    2:33 PM  Readmission Risk Prevention Plan  Transportation Screening Complete Complete Complete  HRI or Home Care Consult Complete    Social Work Consult for Recovery Care Planning/Counseling Complete    Palliative Care Screening Not Applicable    Medication Review Oceanographer) Complete Complete Complete  HRI or Home Care Consult  Complete Complete  SW Recovery  Care/Counseling Consult  Complete Complete  Palliative Care Screening  Not Applicable Not Applicable  Skilled Nursing Facility  Not Applicable Patient Refused

## 2022-09-20 NOTE — Care Management Important Message (Signed)
Important Message  Patient Details  Name: Ann [name] MRN: [id number] Date of Birth: [birth year]   Medicare Important Message Given:  Yes     Corey Harold 09/20/2022, 11:31 AM
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
## 2022-09-21 DIAGNOSIS — R7989 Other specified abnormal findings of blood chemistry: Secondary | ICD-10-CM | POA: Diagnosis not present

## 2022-09-21 LAB — BASIC METABOLIC PANEL
Anion gap: 9 (ref 5–15)
BUN: 31 mg/dL — ABNORMAL HIGH (ref 8–23)
CO2: 29 mmol/L (ref 22–32)
Calcium: 9 mg/dL (ref 8.9–10.3)
Chloride: 101 mmol/L (ref 98–111)
Creatinine, Ser: 1.1 mg/dL — ABNORMAL HIGH (ref 0.44–1.00)
GFR, Estimated: 52 mL/min — ABNORMAL LOW (ref >60)
Glucose, Bld: 170 mg/dL — ABNORMAL HIGH (ref 70–99)
Potassium: 4.1 mmol/L (ref 3.5–5.1)
Sodium: 139 mmol/L (ref 135–145)

## 2022-09-21 LAB — GLUCOSE, CAPILLARY
Glucose-Capillary: 184 mg/dL — ABNORMAL HIGH (ref 70–99)
Glucose-Capillary: 263 mg/dL — ABNORMAL HIGH (ref 70–99)
Glucose-Capillary: 252 mg/dL — ABNORMAL HIGH (ref 70–99)

## 2022-09-21 MED ORDER — BISOPROLOL FUMARATE 5 MG PO TABS
2.5000 mg | ORAL_TABLET | Freq: Every day | ORAL | Status: DC
  Administered 2022-09-21 – 2022-09-22 (×2): 2.5 mg via ORAL
  Filled 2022-09-21 (×2): qty 1

## 2022-09-21 MED ORDER — FUROSEMIDE 40 MG PO TABS: 40.0000 mg | ORAL_TABLET | Freq: Two times a day (BID) | ORAL | 2 refills | Status: DC

## 2022-09-21 MED ORDER — FUROSEMIDE 40 MG PO TABS
40.0000 mg | ORAL_TABLET | Freq: Two times a day (BID) | ORAL | Status: DC
  Administered 2022-09-22 – 2022-09-23 (×3): 40 mg via ORAL
  Filled 2022-09-21 (×3): qty 1

## 2022-09-21 NOTE — Progress Notes (Signed)
PROGRESS NOTE    Ann [name]  ZOX:[id number] DOB: April 06, 1946 DOA: 09/16/2022 PCP: Benetta Spar, MD   Brief Narrative: Ann [name] is a 77 y.o. female with a history of atrial fibrillation, COPD on oxygen, hyperlipidemia, GERD, diabetes mellitus type 2, CAD, hypothyroidism. Patient presented secondary to leg swelling and shortness of breath with exertion and was found to have evidence of acute heart failure in addition to left leg cellulitis. Hospitalization complicated by atrial fibrillation with RVR.   Assessment and Plan:  Acute on chronic diastolic heart failure Last Transthoracic Echocardiogram from 2021 significant for an LVEF of 60-65%. Patient without evidence of acute pulmonary disease on imaging but with concern for acute illness from symptomatology. Patient started on Lasix IV. Transthoracic Echocardiogram on 4/24 significant for preserved LVEF. Creatinine bumping up with diuresis now. -Discontinue Lasix IV -Start home Lasix 40 mg PO BID tomorrow  Left LE cellulitis Empiric Ceftriaxone IV started. Significant improvement from initial margins. -Continue Ceftriaxone  Atrial fibrillation with RVR In setting of acute illness but also with home Cardizem decreased on admission. TSH from 4/15 this month of 4.260. Patient started on Cardizem CD 180 mg on admission. Cardizem 30 mg BID added for rate control without improvement of rate. Metoprolol 5 mg IV added with improvement of heart rate to 80s-100s. RVR resolved. Patient continues to have significatn tachycardia to 160s with ambulation without symptoms. -Continue home Cardizem CD 240 mg daily -Cardiology recommendations (4/26): Add bisoprolol 2.5 mg and titrate up as needed -Add bisoprolol 2.5 mg daily  Microcytic anemia Chronic. Stable.  Elevated troponin Minimal elevation with flat trend of 32 > 32. No chest pain.  COPD Stable.  Chronic respiratory failure with hypoxia Stable. -Continue supplemental  oxygen  Hyperlipidemia -Continue Lipitor  GERD -Continue Protonix  Diabetes mellitus type 2 Controlled with hemoglobin A1C of 7.8%. Patient is managed on Humalog as an outpatient. Patient started on Semglee and SSI on admission. -Continue Semglee and SSI  CAD Noted. -Continue Eliquis/Lipitor  Hypothyroidism -Continue Synthroid  Morbid obesity Estimated body mass index is 48.07 kg/m as calculated from the following:   Height as of this encounter: 5\' 3"  (1.6 m).   Weight as of this encounter: 123.1 kg.  DVT prophylaxis: Eliquis Code Status:   Code Status: DNR Family Communication: Niece and niece's son at bedside Disposition Plan: Discharge home with home health services likely in 1 days pending better control of heart rate if able.   Consultants:  None  Procedures:  4/24: Transthoracic Echocardiogram   Antimicrobials: Ceftriaxone IV    Subjective: No issues this morning.  Objective: BP 139/84   Pulse 69   Temp 98.6 F (37 C) (Oral)   Resp 20   Ht 5\' 3"  (1.6 m)   Wt 123.1 kg   SpO2 97%   BMI 48.07 kg/m   Examination:  General exam: Appears calm and comfortable Respiratory system: Clear to auscultation. Respiratory effort normal. Cardiovascular system: S1 & S2 heard, irregular rhythm with normal rate. Gastrointestinal system: Abdomen is nondistended, soft and nontender. Normal bowel sounds heard. Central nervous system: Alert and oriented. No focal neurological deficits. Musculoskeletal: No calf tenderness Skin: No cyanosis. No rashes Psychiatry: Judgement and insight appear normal. Mood & affect appropriate.    Data Reviewed: I have personally reviewed following labs and imaging studies  CBC Lab Results  Component Value Date   WBC 9.1 09/18/2022   RBC 4.52 09/18/2022   HGB 9.4 (L) 09/18/2022   HCT 33.4 (L) 09/18/2022  MCV 73.9 (L) 09/18/2022   MCH 20.8 (L) 09/18/2022   PLT 254 09/18/2022   MCHC 28.1 (L) 09/18/2022   RDW 19.7 (H)  09/18/2022   LYMPHSABS 1.4 09/09/2022   MONOABS 0.8 12/23/2020   EOSABS 0.2 09/09/2022   BASOSABS 0.0 09/09/2022     Last metabolic panel Lab Results  Component Value Date   NA 139 09/21/2022   K 4.1 09/21/2022   CL 101 09/21/2022   CO2 29 09/21/2022   BUN 31 (H) 09/21/2022   CREATININE 1.10 (H) 09/21/2022   GLUCOSE 170 (H) 09/21/2022   GFRNONAA 52 (L) 09/21/2022   GFRAA 85 04/11/2020   CALCIUM 9.0 09/21/2022   PHOS 4.3 09/17/2022   PROT 7.0 09/18/2022   ALBUMIN 3.3 (L) 09/18/2022   LABGLOB 3.2 06/07/2022   AGRATIO 1.2 06/07/2022   BILITOT 0.5 09/18/2022   ALKPHOS 91 09/18/2022   AST 17 09/18/2022   ALT 16 09/18/2022   ANIONGAP 9 09/21/2022    GFR: Estimated Creatinine Clearance: 54.6 mL/min (A) (by C-G formula based on SCr of 1.1 mg/dL (H)).  No results found for this or any previous visit (from the past 240 hour(s)).    Radiology Studies: No results found.    LOS: 4 days    Jacquelin [facility], MD [facility] 09/21/2022, 1:38 PM   If 7PM-7AM, please contact night-coverage [email or web address]

## 2022-09-21 NOTE — TOC Progression Note (Signed)
Transition of Care [facility]) - Progression Note    Patient Details  Name: CAREE WOLPERT MRN: [id number] Date of Birth: 26-Nov-1945  Transition of Care [facility]) CM/SW Contact  Catalina Gravel, [place] Phone Number: 09/21/2022, 5:34 PM  Clinical Narrative:    Pt was to dc today but had not by 5:30. CSW contacted pt. to determine if   would reconsider SNF. Declined. Pt has [employer] for home O2 asking for help switching to [facility]- not satisfied with [employer]. CSW attempted to reach [place] with [facility], no answer.    Expected Discharge Plan: Home/Self Care Barriers to Discharge: Continued Medical Work up  Expected Discharge Plan and Services In-house Referral: Clinical Social Work     Living arrangements for the past 2 months: Single Family Home                           HH Arranged: Refused HH           Social Determinants of Health (SDOH) Interventions SDOH Screenings   Food Insecurity: No Food Insecurity (09/17/2022)  Housing: Low Risk  (09/17/2022)  Transportation Needs: No Transportation Needs (09/17/2022)  Utilities: Not At Risk (09/17/2022)  Depression (PHQ2-9): Low Risk  (07/04/2020)  Tobacco Use: Medium Risk (09/20/2022)    Readmission Risk Interventions    09/18/2022    8:34 AM 12/19/2020    4:03 PM 05/16/2020    2:33 PM  Readmission Risk Prevention Plan  Transportation Screening Complete Complete Complete  HRI or Home Care Consult Complete    Social Work Consult for Recovery Care Planning/Counseling Complete    Palliative Care Screening Not Applicable    Medication Review Oceanographer) Complete Complete Complete  HRI or Home Care Consult  Complete Complete  SW Recovery Care/Counseling Consult  Complete Complete  Palliative Care Screening  Not Applicable Not Applicable  Skilled Nursing Facility  Not Applicable Patient Refused

## 2022-09-21 NOTE — Plan of Care (Signed)
  Problem: Education: Goal: Knowledge of General Education information will improve Description Including pain rating scale, medication(s)/side effects and non-pharmacologic comfort measures Outcome: Progressing   Problem: Health Behavior/Discharge Planning: Goal: Ability to manage health-related needs will improve Outcome: Progressing   

## 2022-09-21 NOTE — Progress Notes (Signed)
Patient ambulated to and from the bathroom. HR sustained in the 150-160s. MD Nettey notified.

## 2022-09-22 DIAGNOSIS — R7989 Other specified abnormal findings of blood chemistry: Secondary | ICD-10-CM | POA: Diagnosis not present

## 2022-09-22 LAB — BASIC METABOLIC PANEL
Anion gap: 9 (ref 5–15)
BUN: 22 mg/dL (ref 8–23)
CO2: 26 mmol/L (ref 22–32)
Calcium: 9.3 mg/dL (ref 8.9–10.3)
Chloride: 103 mmol/L (ref 98–111)
Creatinine, Ser: 0.77 mg/dL (ref 0.44–1.00)
GFR, Estimated: >60 mL/min (ref >60)
Glucose, Bld: 147 mg/dL — ABNORMAL HIGH (ref 70–99)
Potassium: 3.9 mmol/L (ref 3.5–5.1)
Sodium: 138 mmol/L (ref 135–145)

## 2022-09-22 LAB — GLUCOSE, CAPILLARY
Glucose-Capillary: 280 mg/dL — ABNORMAL HIGH (ref 70–99)
Glucose-Capillary: 155 mg/dL — ABNORMAL HIGH (ref 70–99)
Glucose-Capillary: 274 mg/dL — ABNORMAL HIGH (ref 70–99)
Glucose-Capillary: 301 mg/dL — ABNORMAL HIGH (ref 70–99)
Glucose-Capillary: 176 mg/dL — ABNORMAL HIGH (ref 70–99)

## 2022-09-22 MED ORDER — BISOPROLOL FUMARATE 5 MG PO TABS
5.0000 mg | ORAL_TABLET | Freq: Every day | ORAL | Status: DC
  Administered 2022-09-23: 5 mg via ORAL
  Filled 2022-09-22: qty 1

## 2022-09-22 NOTE — Progress Notes (Signed)
PROGRESS NOTE    GABBI WHETSTONE  JOA:[id number] DOB: 1945/10/31 DOA: 09/16/2022 PCP: Benetta Spar, MD   Brief Narrative: CAROLEANN CASLER is a 77 y.o. female with a history of atrial fibrillation, COPD on oxygen, hyperlipidemia, GERD, diabetes mellitus type 2, CAD, hypothyroidism. Patient presented secondary to leg swelling and shortness of breath with exertion and was found to have evidence of acute heart failure in addition to left leg cellulitis. Hospitalization complicated by atrial fibrillation with RVR.   Assessment and Plan:  Acute on chronic diastolic heart failure Last Transthoracic Echocardiogram from 2021 significant for an LVEF of 60-65%. Patient without evidence of acute pulmonary disease on imaging but with concern for acute illness from symptomatology. Patient started on Lasix IV. Transthoracic Echocardiogram on 4/24 significant for preserved LVEF. Creatinine bumping up with diuresis now. Transitioned to Lasix PO. -Continue home Lasix 40 mg PO BID  Left LE cellulitis Empiric Ceftriaxone IV started. Significant improvement from initial margins. Resolved. Antibiotic course completed. -Discontinue Ceftriaxone  Atrial fibrillation with RVR In setting of acute illness but also with home Cardizem decreased on admission. TSH from 4/15 this month of 4.260. Patient started on Cardizem CD 180 mg on admission. Cardizem 30 mg BID added for rate control without improvement of rate. Metoprolol 5 mg IV added with improvement of heart rate to 80s-100s. RVR resolved. Patient continues to have significatn tachycardia to 160s with ambulation without symptoms. -Continue home Cardizem CD 240 mg daily -Cardiology recommendations (4/26): Add bisoprolol 2.5 mg and titrate up as needed -Increase to bisoprolol 5 mg daily in AM and await cardiology follow-up in AM  Microcytic anemia Chronic. Stable.  Elevated troponin Minimal elevation with flat trend of 32 > 32. No chest  pain.  COPD Stable.  Chronic respiratory failure with hypoxia Stable. -Continue supplemental oxygen  Hyperlipidemia -Continue Lipitor  GERD -Continue Protonix  Diabetes mellitus type 2 Controlled with hemoglobin A1C of 7.8%. Patient is managed on Humalog as an outpatient. Patient started on Semglee and SSI on admission. -Continue Semglee and SSI  CAD Noted. -Continue Eliquis/Lipitor  Hypothyroidism -Continue Synthroid  Morbid obesity Estimated body mass index is 46.98 kg/m as calculated from the following:   Height as of this encounter: 5\' 3"  (1.6 m).   Weight as of this encounter: 120.3 kg.  DVT prophylaxis: Eliquis Code Status:   Code Status: DNR Family Communication: None at bedside Disposition Plan: Discharge home with home health services likely in 1 days pending better control of heart rate if able and cardiology recommendations   Consultants:  Cardiology  Procedures:  4/24: Transthoracic Echocardiogram   Antimicrobials: Ceftriaxone IV    Subjective: Patient reports no issues. Did not sleep well.  Objective: BP 114/72 (BP Location: Right Wrist)   Pulse 93   Temp 98.8 F (37.1 C) (Oral)   Resp 20   Ht 5\' 3"  (1.6 m)   Wt 120.3 kg   SpO2 95%   BMI 46.98 kg/m   Examination:  General exam: Appears calm and comfortable Respiratory system: Clear to auscultation. Respiratory effort normal. Cardiovascular system: S1 & S2 heard, Irregular rhythm. Gastrointestinal system: Abdomen is nondistended, soft and nontender. Normal bowel sounds heard. Central nervous system: Alert and oriented. No focal neurological deficits. Musculoskeletal: No calf tenderness Skin: No cyanosis. No rashes Psychiatry: Judgement and insight appear normal. Mood & affect appropriate.    Data Reviewed: I have personally reviewed following labs and imaging studies  CBC Lab Results  Component Value Date   WBC 9.1  09/18/2022   RBC 4.52 09/18/2022   HGB 9.4 (L) 09/18/2022    HCT 33.4 (L) 09/18/2022   MCV 73.9 (L) 09/18/2022   MCH 20.8 (L) 09/18/2022   PLT 254 09/18/2022   MCHC 28.1 (L) 09/18/2022   RDW 19.7 (H) 09/18/2022   LYMPHSABS 1.4 09/09/2022   MONOABS 0.8 12/23/2020   EOSABS 0.2 09/09/2022   BASOSABS 0.0 09/09/2022     Last metabolic panel Lab Results  Component Value Date   NA 138 09/22/2022   K 3.9 09/22/2022   CL 103 09/22/2022   CO2 26 09/22/2022   BUN 22 09/22/2022   CREATININE 0.77 09/22/2022   GLUCOSE 147 (H) 09/22/2022   GFRNONAA >60 09/22/2022   GFRAA 85 04/11/2020   CALCIUM 9.3 09/22/2022   PHOS 4.3 09/17/2022   PROT 7.0 09/18/2022   ALBUMIN 3.3 (L) 09/18/2022   LABGLOB 3.2 06/07/2022   AGRATIO 1.2 06/07/2022   BILITOT 0.5 09/18/2022   ALKPHOS 91 09/18/2022   AST 17 09/18/2022   ALT 16 09/18/2022   ANIONGAP 9 09/22/2022    GFR: Estimated Creatinine Clearance: 74 mL/min (by C-G formula based on SCr of 0.77 mg/dL).  No results found for this or any previous visit (from the past 240 hour(s)).    Radiology Studies: No results found.    LOS: 5 days    Jacquelin Hawking, MD [facility] 09/22/2022, 12:41 PM   If 7PM-7AM, please contact night-coverage [email or web address]

## 2022-09-22 NOTE — Plan of Care (Signed)
Patient AOX4, VSS throughout shift.  All meds given on time as ordered.  Denied pain and SOB.  Diminished lungs, IS encouraged.  Purewick in place.  POC maintained, will continue to monitor.  Problem: Education: Goal: Knowledge of General Education information will improve Description: Including pain rating scale, medication(s)/side effects and non-pharmacologic comfort measures Outcome: Progressing   Problem: Health Behavior/Discharge Planning: Goal: Ability to manage health-related needs will improve Outcome: Progressing   Problem: Clinical Measurements: Goal: Ability to maintain clinical measurements within normal limits will improve Outcome: Progressing Goal: Will remain free from infection Outcome: Progressing Goal: Diagnostic test results will improve Outcome: Progressing Goal: Respiratory complications will improve Outcome: Progressing Goal: Cardiovascular complication will be avoided Outcome: Progressing   Problem: Activity: Goal: Risk for activity intolerance will decrease Outcome: Progressing   Problem: Nutrition: Goal: Adequate nutrition will be maintained Outcome: Progressing   Problem: Coping: Goal: Level of anxiety will decrease Outcome: Progressing   Problem: Elimination: Goal: Will not experience complications related to bowel motility Outcome: Progressing Goal: Will not experience complications related to urinary retention Outcome: Progressing   Problem: Pain Managment: Goal: General experience of comfort will improve Outcome: Progressing   Problem: Safety: Goal: Ability to remain free from injury will improve Outcome: Progressing   Problem: Skin Integrity: Goal: Risk for impaired skin integrity will decrease Outcome: Progressing   Problem: Education: Goal: Ability to describe self-care measures that may prevent or decrease complications (Diabetes Survival Skills Education) will improve Outcome: Progressing Goal: Individualized Educational  Video(s) Outcome: Progressing   Problem: Coping: Goal: Ability to adjust to condition or change in health will improve Outcome: Progressing   Problem: Fluid Volume: Goal: Ability to maintain a balanced intake and output will improve Outcome: Progressing   Problem: Health Behavior/Discharge Planning: Goal: Ability to identify and utilize available resources and services will improve Outcome: Progressing Goal: Ability to manage health-related needs will improve Outcome: Progressing   Problem: Metabolic: Goal: Ability to maintain appropriate glucose levels will improve Outcome: Progressing   Problem: Nutritional: Goal: Maintenance of adequate nutrition will improve Outcome: Progressing Goal: Progress toward achieving an optimal weight will improve Outcome: Progressing   Problem: Skin Integrity: Goal: Risk for impaired skin integrity will decrease Outcome: Progressing   Problem: Tissue Perfusion: Goal: Adequacy of tissue perfusion will improve Outcome: Progressing

## 2022-09-22 NOTE — Progress Notes (Signed)
This nurse ambulated patient in the hallway with a walker. Patient was on 3L of o2. Patient walked about 75 ft. Patients hr on the telemetry box staying in between 100-130. Patient did state she got a little SOB.  MD Nettey notified.

## 2022-09-22 NOTE — Progress Notes (Signed)
The tech ambulated patient and hr got up to 107. MD Nettey notified.

## 2022-09-23 DIAGNOSIS — R7989 Other specified abnormal findings of blood chemistry: Secondary | ICD-10-CM | POA: Diagnosis not present

## 2022-09-23 DIAGNOSIS — I5033 Acute on chronic diastolic (congestive) heart failure: Secondary | ICD-10-CM | POA: Diagnosis not present

## 2022-09-23 LAB — GLUCOSE, CAPILLARY
Glucose-Capillary: 177 mg/dL — ABNORMAL HIGH (ref 70–99)
Glucose-Capillary: 263 mg/dL — ABNORMAL HIGH (ref 70–99)

## 2022-09-23 MED ORDER — BISOPROLOL FUMARATE 5 MG PO TABS: 5.0000 mg | ORAL_TABLET | Freq: Every day | ORAL | 2 refills | Status: DC

## 2022-09-23 NOTE — Discharge Summary (Signed)
Physician Discharge Summary   Patient: Ann [name] MRN: [id number] DOB: Jun 22, 1945  Admit date:     09/16/2022  Discharge date: 09/23/22  Discharge Physician: Jacquelin Hawking, MD   PCP: Benetta Spar, MD   Recommendations at discharge:   PCP and cardiology follow-up  Discharge Diagnoses: Principal Problem:   Elevated brain natriuretic peptide (BNP) level Active Problems:   Acquired hypothyroidism   Mixed hyperlipidemia   Morbid obesity with BMI of 50.0-59.9, adult (HCC)   Atrial fibrillation, chronic (HCC)   Microcytic anemia   Elevated troponin   GERD without esophagitis   Coronary artery disease of native artery of native heart with stable angina pectoris (HCC)   COPD GOLD 0 / 02 dep / prob Cor pulmonale   Uncontrolled type 2 diabetes mellitus with hyperglycemia, with long-term current use of insulin (HCC)  Resolved Problems:   * No resolved hospital problems. *  Hospital Course: Ann [name] is a 77 y.o. female with a history of atrial fibrillation, COPD on oxygen, hyperlipidemia, GERD, diabetes mellitus type 2, CAD, hypothyroidism. Patient presented secondary to leg swelling and shortness of breath with exertion and was found to have evidence of acute heart failure in addition to left leg cellulitis. Hospitalization complicated by atrial fibrillation with RVR which improved with home diltiazem and addition of bisoprolol.  Assessment and Plan:  Acute on chronic diastolic heart failure Last Transthoracic Echocardiogram from 2021 significant for an LVEF of 60-65%. Patient without evidence of acute pulmonary disease on imaging but with concern for acute illness from symptomatology. Patient started on Lasix IV. Transthoracic Echocardiogram on 4/24 significant for preserved LVEF. Creatinine bumping up with diuresis now. Transitioned to Lasix PO.   Left LE cellulitis Empiric Ceftriaxone IV started. Significant improvement from initial margins. Resolved. Antibiotic  course completed.   Atrial fibrillation with RVR In setting of acute illness but also with home Cardizem decreased on admission. TSH from 4/15 this month of 4.260. Patient started on Cardizem CD 180 mg on admission. Cardizem 30 mg BID added for rate control without improvement of rate. Metoprolol 5 mg IV added with improvement of heart rate to 80s-100s. RVR resolved. Patient continued to have significatn tachycardia to 160s with ambulation without symptoms. Bisoprolol added with eventual improvement of ambulatory heart rate to between 90-120 bpm. Cardiology with recommendations for outpatient follow-up.   Microcytic anemia Chronic. Stable.   Elevated troponin Minimal elevation with flat trend of 32 > 32. No chest pain.   COPD Stable.   Chronic respiratory failure with hypoxia Stable. Continue supplemental oxygen   Hyperlipidemia Continue Lipitor   GERD Continue Protonix   Diabetes mellitus type 2 Controlled with hemoglobin A1C of 7.8%. Patient is managed on Humalog as an outpatient. Patient started on Semglee and SSI on admission. Continue outpatient regimen.   CAD Noted. Continue Eliquis/Lipitor   Hypothyroidism Continue Synthroid   Morbid obesity Estimated body mass index is 48.86 kg/m as calculated from the following:   Height as of this encounter: 5\' 3"  (1.6 m).   Weight as of this encounter: 125.1 kg.    Consultants: Cardiology Procedures performed:  4/24: Transthoracic Echocardiogram   Disposition: Home  Diet recommendation: Carb modified diet   DISCHARGE MEDICATION: Allergies as of 09/23/2022       Reactions   Ace Inhibitors Other (See Comments)   Reaction unknown   Asa [aspirin] Other (See Comments)   Causes bleeding   Breztri Aerosphere [budeson-glycopyrrol-formoterol]    Tape Other (See Comments)   Skin tearing,  causes scars Other Reaction(s): Other (See Comments)   Niacin Rash   Reglan [metoclopramide] Anxiety        Medication List      STOP taking these medications    doxycycline 100 MG capsule Commonly known as: VIBRAMYCIN       TAKE these medications    acetaminophen 325 MG tablet Commonly known as: TYLENOL Take 2 tablets (650 mg total) by mouth every 6 (six) hours as needed for mild pain, moderate pain or fever.   albuterol 108 (90 Base) MCG/ACT inhaler Commonly known as: VENTOLIN HFA Inhale 2 puffs into the lungs every 4 (four) hours as needed for wheezing.   apixaban 5 MG Tabs tablet Commonly known as: ELIQUIS Take 1 tablet (5 mg total) by mouth 2 (two) times daily.   atorvastatin 80 MG tablet Commonly known as: LIPITOR Take 1 tablet (80 mg total) by mouth daily at 6 PM.   bisoprolol 5 MG tablet Commonly known as: ZEBETA Take 1 tablet (5 mg total) by mouth daily. Start taking on: September 24, 2022   budesonide-formoterol 80-4.5 MCG/ACT inhaler Commonly known as: Symbicort Take 2 puffs first thing in am and then another 2 puffs about 12 hours later.   Carboxymethylcellulose Sod PF 0.5 % Soln Place 2 drops into both eyes daily as needed (for dry eye relief).   cyanocobalamin 1000 MCG tablet Take 1 tablet (1,000 mcg total) by mouth daily.   diltiazem 240 MG 24 hr capsule Commonly known as: CARDIZEM CD Take 1 capsule (240 mg total) by mouth daily.   esomeprazole 40 MG capsule Commonly known as: NexIUM Take 1 capsule (40 mg total) by mouth 2 (two) times daily before a meal.   FreeStyle Libre 2 Sensor Misc Use to check glucose as directed. Change sensor every 14 days.   furosemide 40 MG tablet Commonly known as: Lasix Take 1 tablet (40 mg total) by mouth 2 (two) times daily.   gabapentin 400 MG capsule Commonly known as: NEURONTIN Take 1 capsule (400 mg total) by mouth 3 (three) times daily.   Gemtesa 75 MG Tabs Generic drug: Vibegron Take 1 tablet (75 mg total) by mouth daily.   glipiZIDE 5 MG 24 hr tablet Commonly known as: GLUCOTROL XL Take 1 tablet (5 mg total) by mouth daily  with breakfast.   Insulin Lispro Prot & Lispro (75-25) 100 UNIT/ML Kwikpen Commonly known as: HumaLOG Mix 75/25 KwikPen Inject 40-50 Units into the skin 2 (two) times daily before a meal.   ipratropium-albuterol 0.5-2.5 (3) MG/3ML Soln Commonly known as: DUONEB Take 3 mLs by nebulization every 6 (six) hours as needed (copd).   levothyroxine 175 MCG tablet Commonly known as: SYNTHROID Take 1 tablet (175 mcg total) by mouth every morning.   nitroGLYCERIN 0.4 MG SL tablet Commonly known as: NITROSTAT Place 1 tablet (0.4 mg total) under the tongue every 5 (five) minutes as needed for chest pain.   predniSONE 10 MG tablet Commonly known as: DELTASONE Take 1 tablet (10 mg total) by mouth daily with breakfast.   Salonpas Pain Relief Patch Ptch Apply 1 patch topically daily as needed (pain).   solifenacin 10 MG tablet Commonly known as: VESICARE Take 10 mg by mouth at bedtime.   Vitamin D3 125 MCG (5000 UT) Caps Take 1 capsule (5,000 Units total) by mouth daily.        Follow-up Information     Fanta, Wayland Salinas, MD. Schedule an appointment as soon as possible for a visit in 1  week(s).   Specialty: Internal Medicine Why: For hospital follow-up Contact information: [address] [place] [address] [phone]         Antoine Poche, MD. Schedule an appointment as soon as possible for a visit.   Specialty: Cardiology Why: For hospital follow-up Contact information: [address] [place] [address] [phone]                Discharge Exam: BP 124/83 (BP Location: Right Arm)   Pulse 86   Temp 98.2 F (36.8 C) (Oral)   Resp 18   Ht 5\' 3"  (1.6 m)   Wt 125.1 kg   SpO2 97%   BMI 48.86 kg/m   General exam: Appears calm and comfortable Respiratory system: Clear to auscultation. Respiratory effort normal. Cardiovascular system: S1 & S2 heard, irregular rhythm with normal rate. Gastrointestinal system: Abdomen is nondistended,  soft and nontender. Normal bowel sounds heard. Central nervous system: Alert and oriented. No focal neurological deficits. Musculoskeletal: Mild BLE edema. No calf tenderness Psychiatry: Judgement and insight appear normal. Mood & affect appropriate.   Condition at discharge: stable  The results of significant diagnostics from this hospitalization (including imaging, microbiology, ancillary and laboratory) are listed below for reference.   Imaging Studies: ECHOCARDIOGRAM COMPLETE  Result Date: 09/18/2022    ECHOCARDIOGRAM REPORT   Patient Name:   ACCALIA RIGDON Date of Exam: 09/18/2022 Medical Rec #:  [id number]        Height:       63.0 in Accession #:    [id number]       Weight:       275.6 lb Date of Birth:  08-25-1945         BSA:          2.216 m Patient Age:    77 years         BP:           109/80 mmHg Patient Gender: F                HR:           96 bpm. [facility] Procedure: 2D Echo, Cardiac Doppler and Color Doppler Indications:    CHF-Acute Diastolic I50.31  History:        Patient has prior history of Echocardiogram examinations, most                 recent 01/24/2020. CAD, COPD, Arrythmias:Atrial Fibrillation;                 Risk Factors:Diabetes, Dyslipidemia, Former Smoker, Hypertension                 and Sleep Apnea.  Sonographer:    Aron Baba Referring Phys: [id number] OLADAPO ADEFESO  Sonographer Comments: Image acquisition challenging due to COPD, Image acquisition challenging due to patient body habitus and Image acquisition challenging due to respiratory motion. IMPRESSIONS  1. Left ventricular ejection fraction, by estimation, is 60 to 65%. The left ventricle has normal function. The left ventricle has no regional wall motion abnormalities. There is mild left ventricular hypertrophy. Left ventricular diastolic function could not be evaluated. The E/e' is 25.  2. Right ventricular systolic function is normal. The right ventricular size is normal. Tricuspid  regurgitation signal is inadequate for assessing PA pressure.  3. Left atrial size was moderately dilated.  4. The mitral valve is normal in structure. No evidence of mitral valve regurgitation. No evidence of  mitral stenosis.  5. The aortic valve has an indeterminant number of cusps. Aortic valve regurgitation is not visualized. No aortic stenosis is present.  6. Aortic dilatation noted. There is borderline dilatation of the aortic root, measuring 38 mm. There is mild dilatation of the ascending aorta, measuring 44 mm.  7. The inferior vena cava is dilated in size with >50% respiratory variability, suggesting right atrial pressure of 8 mmHg. Comparison(s): No significant change from prior study. FINDINGS  Left Ventricle: Left ventricular ejection fraction, by estimation, is 60 to 65%. The left ventricle has normal function. The left ventricle has no regional wall motion abnormalities. The left ventricular internal cavity size was normal in size. There is  mild left ventricular hypertrophy. Left ventricular diastolic function could not be evaluated due to atrial fibrillation. Left ventricular diastolic function could not be evaluated. The E/e' is 25. Right Ventricle: The right ventricular size is normal. No increase in right ventricular wall thickness. Right ventricular systolic function is normal. Tricuspid regurgitation signal is inadequate for assessing PA pressure. Left Atrium: Left atrial size was moderately dilated. Right Atrium: Right atrial size was normal in size. Pericardium: Trivial pericardial effusion is present. Mitral Valve: The mitral valve is normal in structure. No evidence of mitral valve regurgitation. No evidence of mitral valve stenosis. Tricuspid Valve: The tricuspid valve is normal in structure. Tricuspid valve regurgitation is not demonstrated. No evidence of tricuspid stenosis. Aortic Valve: The aortic valve has an indeterminant number of cusps. Aortic valve regurgitation is not visualized.  No aortic stenosis is present. Aortic valve mean gradient measures 2.0 mmHg. Aortic valve peak gradient measures 3.1 mmHg. Aortic valve area, by VTI measures 1.66 cm. Pulmonic Valve: The pulmonic valve was not well visualized. Pulmonic valve regurgitation is not visualized. No evidence of pulmonic stenosis. Aorta: Aortic dilatation noted. There is borderline dilatation of the aortic root, measuring 38 mm. There is mild dilatation of the ascending aorta, measuring 44 mm. Venous: The inferior vena cava is dilated in size with greater than 50% respiratory variability, suggesting right atrial pressure of 8 mmHg. IAS/Shunts: No atrial level shunt detected by color flow Doppler.  LEFT VENTRICLE PLAX 2D LVIDd:         5.00 cm   Diastology LVIDs:         3.40 cm   LV e' medial:    4.13 cm/s LV PW:         1.40 cm   LV E/e' medial:  25.2 LV IVS:        1.10 cm   LV e' lateral:   9.14 cm/s LVOT diam:     1.60 cm   LV E/e' lateral: 11.4 LV SV:         27 LV SV Index:   12 LVOT Area:     2.01 cm  RIGHT VENTRICLE RV S prime:     8.87 cm/s TAPSE (M-mode): 2.0 cm LEFT ATRIUM           Index        RIGHT ATRIUM           Index LA diam:      3.00 cm 1.35 cm/m   RA Area:     18.30 cm LA Vol (A2C): 22.6 ml 10.20 ml/m  RA Volume:   49.10 ml  22.16 ml/m LA Vol (A4C): 96.3 ml 43.46 ml/m  AORTIC VALVE AV Area (Vmax):    1.89 cm AV Area (Vmean):   1.78 cm AV Area (VTI):  1.66 cm AV Vmax:           87.70 cm/s AV Vmean:          63.300 cm/s AV VTI:            0.164 m AV Peak Grad:      3.1 mmHg AV Mean Grad:      2.0 mmHg LVOT Vmax:         82.53 cm/s LVOT Vmean:        55.900 cm/s LVOT VTI:          0.136 m LVOT/AV VTI ratio: 0.83  AORTA Ao Root diam: 3.80 cm Ao Asc diam:  4.40 cm MITRAL VALVE MV Area (PHT): 4.63 cm     SHUNTS MV Decel Time: 164 msec     Systemic VTI:  0.14 m MV E velocity: 104.00 cm/s  Systemic Diam: 1.60 cm MV A velocity: 38.90 cm/s MV E/A ratio:  2.67 Vishnu Priya Mallipeddi Electronically signed by Winfield Rast Mallipeddi Signature Date/Time: 09/18/2022/3:51:31 PM    Final    DG Chest 2 View  Result Date: 09/16/2022 CLINICAL DATA:  Edema. Leg swelling and chest tightness for a week. Shortness of breath. Former smoker. EXAM: CHEST - 2 VIEW COMPARISON:  09/09/2022 FINDINGS: Shallow inspiration. Mild cardiac enlargement. No vascular congestion. Linear scarring in the left mid lung is unchanged since prior study. No evidence of developing consolidation, edema, or effusion. Mediastinal contours appear intact. No pneumothorax. Calcification of the aorta. Postoperative changes in the cervical spine. Degenerative changes in the shoulders. IMPRESSION: 1. Stable appearance of linear scarring in the left mid lung. 2. No evidence of active pulmonary disease. Electronically Signed   By: Burman Nieves M.D.   On: 09/16/2022 16:35   DG Chest 2 View  Result Date: 09/12/2022 CLINICAL DATA:  Dyspnea on exertion. EXAM: CHEST - 2 VIEW COMPARISON:  PA and lateral chest 05/28/2022 and 11/19/2021. CT chest 12/14/2021. FINDINGS: Chronic linear opacity in the left lower lobe could be due to scar atelectasis. The lungs are otherwise clear. Heart size is upper normal. Aortic atherosclerosis noted. IMPRESSION: No acute disease. Aortic Atherosclerosis (ICD10-I70.0). Electronically Signed   By: Drusilla Kanner M.D.   On: 09/12/2022 08:32    Microbiology: Results for orders placed or performed in visit on 08/19/22  Microscopic Examination     Status: Abnormal   Collection Time: 08/19/22  2:13 PM   Urine  Result Value Ref Range Status   WBC, UA >30 (A) 0 - 5 /hpf Final   RBC, Urine 3-10 (A) 0 - 2 /hpf Final   Epithelial Cells (non renal) 0-10 0 - 10 /hpf Final   Bacteria, UA Many (A) None seen/Few Final  Urine Culture     Status: Abnormal   Collection Time: 08/19/22  3:13 PM   Specimen: Urine   UR  Result Value Ref Range Status   Urine Culture, Routine Final report (A)  Final   Organism ID, Bacteria Escherichia coli (A)   Final    Comment: Multi-Drug Resistant Organism Susceptibility profile is consistent with a probable ESBL. Greater than 100,000 colony forming units per mL    Antimicrobial Susceptibility Comment  Final    Comment:       ** S = Susceptible; I = Intermediate; R = Resistant **                    P = Positive; N = Negative  MICS are expressed in micrograms per mL    Antibiotic                 RSLT#1    RSLT#2    RSLT#3    RSLT#4 Amoxicillin/Clavulanic Acid    R Ampicillin                     R Cefazolin                      R Cefepime                       S Ceftriaxone                    R Cefuroxime                     R Ciprofloxacin                  S Ertapenem                      S Gentamicin                     S Imipenem                       S Levofloxacin                   S Meropenem                      S Nitrofurantoin                 S Piperacillin/Tazobactam        R Tetracycline                   R Tobramycin                     S Trimethoprim/Sulfa             R     Labs: CBC: Recent Labs  Lab 09/16/22 1614 09/17/22 1034 09/18/22 0409  WBC 11.0* 9.6 9.1  HGB 9.2* 9.8* 9.4*  HCT 32.6* 35.0* 33.4*  MCV 75.5* 75.9* 73.9*  PLT 252 271 254   Basic Metabolic Panel: Recent Labs  Lab 09/17/22 1034 09/18/22 0409 09/19/22 0356 09/20/22 0349 09/21/22 0345 09/22/22 0405  NA 140 137 138 138 139 138  K 4.0 3.7 3.2* 3.2* 4.1 3.9  CL 100 99 100 100 101 103  CO2 29 28 28 28 29 26   GLUCOSE 147* 157* 147* 157* 170* 147*  BUN 16 19 19 18  31* 22  CREATININE 0.88 0.85 0.99 0.86 1.10* 0.77  CALCIUM 8.9 8.7* 8.4* 9.0 9.0 9.3  MG 1.8  --   --  1.9  --   --   PHOS 4.3  --   --   --   --   --    Liver Function Tests: Recent Labs  Lab 09/18/22 0409  AST 17  ALT 16  ALKPHOS 91  BILITOT 0.5  PROT 7.0  ALBUMIN 3.3*   CBG: Recent Labs  Lab 09/22/22 1127 09/22/22 1610 09/22/22 2009 09/23/22 0744 09/23/22 1124  GLUCAP 274* 301* 176* 177* 263*     Discharge time spent: 35 minutes.  Signed: Jacquelin [facility], MD [facility] 09/23/2022

## 2022-09-23 NOTE — Consult Note (Signed)
[facility] Customer service manager [facility]) Accountable Care Organization (ACO) [facility] Liaison Note  09/23/2022  Ann [name] 1945/12/29 [id number]  Location: [facility] RN Hospital Liaison screened the patient remotely at [name].  Insurance: Ann [name] is a 77 y.o. female who is a Primary Care Patient of Fanta, Wayland Salinas, MD. The patient was screened for readmission hospitalization with noted medium risk score for unplanned readmission risk with 1 IP/1 ED in 6 months.  The patient was assessed for potential [facility]) Care Management service needs for post hospital transition for care coordination. Review of patient's electronic medical record reveals patient discharged home today on her existing home O2. Pt admitted with Acute CHF. [facility] RN liaison attempted outreach call for post op follow up via [facility] RN however unsuccessful.  Plan:  Referral request for community care coordination: anticipate [facility] Transitions of Care Team follow up.   [facility] Care Management/Population Health does not replace or interfere with any arrangements made by the Inpatient Transition of Care team.   For questions contact:   Elliot Cousin, RN, BSN [facility] Liaison [place]   [facility]  Population Health Office Hours MTWF 8:00 am to 6 pm off on Thursday [phone] mobile [phone] [Office toll free line][facility] Office Hours are M-F 8:30 - 5 pm 24 hour nurse advise line [phone] Conceirge  [email or web address]

## 2022-09-23 NOTE — Discharge Instructions (Signed)
Ann [name],  You were in the hospital with heart failure and cellulitis. Your heart failure improved with Lasix and your cellulitis improved with antibiotics. While you were here, your atrial fibrillation was difficult to control because of fast heart rates. You have a new medications called bisoprolol per cardiology recommendations. Please continue with your new medication regimen. Please follow-up with your PCP and cardiologist.

## 2022-09-23 NOTE — Care Management Important Message (Signed)
Important Message  Patient Details  Name: Ann [name] MRN: [id number] Date of Birth: Sep 01, 1945   Medicare Important Message Given:  Yes     Corey Harold 09/23/2022, 11:19 AM

## 2022-09-23 NOTE — Progress Notes (Signed)
Pt was walked from her room to the nurses station pt HR maintained between 90-120.

## 2022-09-23 NOTE — Inpatient Diabetes Management (Signed)
Inpatient Diabetes Program Recommendations  AACE/ADA: New Consensus Statement on Inpatient Glycemic Control  Target Ranges:  Prepandial:   less than 140 mg/dL      Peak postprandial:   less than 180 mg/dL (1-2 hours)      Critically ill patients:  140 - 180 mg/dL    Latest Reference Range & Units 09/22/22 07:23 09/22/22 11:27 09/22/22 16:10 09/22/22 20:09 09/23/22 07:44  Glucose-Capillary 70 - 99 mg/dL 161 (H)  Novolog 3 units  Prednisone 10 mg 274 (H)  Novolog 8 units 301 (H)  Novolog 11 units 176 (H)    Semglee 8 units 177 (H)   Review of Glycemic Control  Diabetes history: DM2 Outpatient Diabetes medications: Humalog 75/25 40-50 units BID, Glipizide XL 5 mg QAM; Prednisone 10 mg daily Current orders for Inpatient glycemic control: Semglee 8 units QHS, Novolog 0-15 units TID with meals, Novolog 0-5 units QHS; Prednisone 10 mg QAM  Inpatient Diabetes Program Recommendations:    Insulin: Post prandial glucose consistently elevated (up to 301 mg/dl on 0/96). If steroids are continued, please consider ordering Novolog 4 units TID with meals for meal coverage if patient eats at least 50% of meals.  Thanks, Orlando Penner, RN, MSN, CDCES Diabetes Coordinator Inpatient Diabetes Program [phone] (Team Pager from 8am to 5pm)

## 2022-09-23 NOTE — TOC Transition Note (Signed)
Transition of Care [facility]) - CM/SW Discharge Note   Patient Details  Name: Ann [name] MRN: [id number] Date of Birth: 18-Aug-1945  [facility]) CM/SW Contact:  Villa Herb, LCSWA Phone Number: 09/23/2022, 10:51 AM   Clinical Narrative:    CSW spoke with pt who states that she would like to switch DME companies if possible. Pt would like to start using [facility]. CSW spoke to referrals supervisor with [employer] who states that due to pt having home O2 with [employer] since 2021 they cannot switch her. She has completed over half of the billing cycle so she will need to stay with [employer]. CSW spoke to [employer] rep Barbara Cower who states they will send a tech to pts home after D/C today or tomorrow to do more education on the home O2. Pt has a tank to get her home. Pt declines HH needs and does not want a referral made. TOC signing off.   Final next level of care: Home/Self Care Barriers to Discharge: Barriers Resolved   Patient Goals and CMS Choice CMS Medicare.gov Compare Post Acute Care list provided to:: Patient Choice offered to / list presented to : Patient  Discharge Placement                         Discharge Plan and Services Additional resources added to the After Visit Summary for   In-house Referral: Clinical Social Work                        HH Arranged: Refused HH          Social Determinants of Health (SDOH) Interventions SDOH Screenings   Food Insecurity: No Food Insecurity (09/17/2022)  Housing: Low Risk  (09/17/2022)  Transportation Needs: No Transportation Needs (09/17/2022)  Utilities: Not At Risk (09/17/2022)  Depression (PHQ2-9): Low Risk  (07/04/2020)  Tobacco Use: Medium Risk (09/20/2022)     Readmission Risk Interventions    09/18/2022    8:34 AM 12/19/2020    4:03 PM 05/16/2020    2:33 PM  Readmission Risk Prevention Plan  Transportation Screening Complete Complete Complete  HRI or Home Care Consult Complete    Social Work  Consult for Recovery Care Planning/Counseling Complete    Palliative Care Screening Not Applicable    Medication Review Oceanographer) Complete Complete Complete  HRI or Home Care Consult  Complete Complete  SW Recovery Care/Counseling Consult  Complete Complete  Palliative Care Screening  Not Applicable Not Applicable  Skilled Nursing Facility  Not Applicable Patient Refused

## 2022-09-23 NOTE — Progress Notes (Signed)
Mobility Specialist Progress Note:    09/23/22 1025  Mobility  Activity Transferred from bed to chair  Level of Assistance Standby assist, set-up cues, supervision of patient - no hands on  Assistive Device Front wheel walker  Distance Ambulated (ft) 5 ft  Activity Response Tolerated well  Mobility Referral Yes  $Mobility charge 1 Mobility   Pt agreeable to mobility session. Tolerated transfer B>C well, asx throughout. Chair alarm on, all needs met, family in room.   Feliciana Rossetti Mobility Specialist Please contact via Special educational needs teacher or  Rehab office at [phone]

## 2022-09-23 NOTE — Progress Notes (Signed)
Rounding Note    Patient Name: Ann [name] Date of Encounter: 09/23/2022  [facility] Cardiologist: Dina Rich, MD   Subjective   No complaints, breathing back to baseline  Inpatient Medications    Scheduled Meds:  apixaban  5 mg Oral BID   atorvastatin  80 mg Oral q1800   bisoprolol  5 mg Oral Daily   cholecalciferol  5,000 Units Oral Daily   diltiazem  240 mg Oral Daily   fesoterodine  4 mg Oral Daily   fluticasone furoate-vilanterol  1 puff Inhalation Daily   furosemide  40 mg Oral BID   gabapentin  400 mg Oral TID   insulin aspart  0-15 Units Subcutaneous TID WC   insulin aspart  0-5 Units Subcutaneous QHS   insulin glargine-yfgn  8 Units Subcutaneous QHS   levothyroxine  175 mcg Oral q morning   pantoprazole  40 mg Oral BID   predniSONE  10 mg Oral Q breakfast   Continuous Infusions:  PRN Meds: acetaminophen **OR** acetaminophen, albuterol, ondansetron **OR** ondansetron (ZOFRAN) IV, polyvinyl alcohol   Vital Signs    Vitals:   09/22/22 2000 09/23/22 0308 09/23/22 0500 09/23/22 0716  BP: 117/68 124/83    Pulse: 81 86    Resp: 18 18    Temp: 98.2 F (36.8 C) 98.2 F (36.8 C)    TempSrc: Oral Oral    SpO2: 98% 97%  97%  Weight:   125.1 kg   Height:        Intake/Output Summary (Last 24 hours) at 09/23/2022 0817 Last data filed at 09/23/2022 0300 Gross per 24 hour  Intake 720 ml  Output 2250 ml  Net -1530 ml      09/23/2022    5:00 AM 09/22/2022    5:00 AM 09/21/2022    5:00 AM  Last 3 Weights  Weight (lbs) 275 lb 12.7 oz 265 lb 3.4 oz 271 lb 6.2 oz  Weight (kg) 125.1 kg 120.3 kg 123.1 kg      Telemetry    Afib controlled rates - Personally Reviewed  ECG    N/a - Personally Reviewed  Physical Exam   GEN: No acute distress.   Neck: No JVD Cardiac: irreg, no m/rg, no jvd Respiratory: Clear to auscultation bilaterally. GI: Soft, nontender, non-distended  MS: No edema; No deformity. Neuro:  Nonfocal  Psych: Normal  affect   Labs    High Sensitivity Troponin:   Recent Labs  Lab 09/16/22 2328 09/17/22 0100  TROPONINIHS 32* 32*     Chemistry Recent Labs  Lab 09/17/22 1034 09/18/22 0409 09/19/22 0356 09/20/22 0349 09/21/22 0345 09/22/22 0405  NA 140 137   < > 138 139 138  K 4.0 3.7   < > 3.2* 4.1 3.9  CL 100 99   < > 100 101 103  CO2 29 28   < > 28 29 26   GLUCOSE 147* 157*   < > 157* 170* 147*  BUN 16 19   < > 18 31* 22  CREATININE 0.88 0.85   < > 0.86 1.10* 0.77  CALCIUM 8.9 8.7*   < > 9.0 9.0 9.3  MG 1.8  --   --  1.9  --   --   PROT  --  7.0  --   --   --   --   ALBUMIN  --  3.3*  --   --   --   --   AST  --  17  --   --   --   --  ALT  --  16  --   --   --   --   ALKPHOS  --  91  --   --   --   --   BILITOT  --  0.5  --   --   --   --   GFRNONAA >60 >60   < > >60 52* >60  ANIONGAP 11 10   < > 10 9 9    < > = values in this interval not displayed.    Lipids No results for input(s): "CHOL", "TRIG", "HDL", "LABVLDL", "LDLCALC", "CHOLHDL" in the last 168 hours.  Hematology Recent Labs  Lab 09/16/22 1614 09/17/22 1034 09/18/22 0409  WBC 11.0* 9.6 9.1  RBC 4.32 4.61 4.52  HGB 9.2* 9.8* 9.4*  HCT 32.6* 35.0* 33.4*  MCV 75.5* 75.9* 73.9*  MCH 21.3* 21.3* 20.8*  MCHC 28.2* 28.0* 28.1*  RDW 19.6* 19.9* 19.7*  PLT 252 271 254   Thyroid No results for input(s): "TSH", "FREET4" in the last 168 hours.  BNP Recent Labs  Lab 09/16/22 1614  BNP 263.0*    DDimer No results for input(s): "DDIMER" in the last 168 hours.   Radiology    No results found.  Cardiac Studies     Patient Profile      Assessment & Plan    1.Acute on chronic HFpEF - 08/2022 echo: LVEF 60-65%, indet diastolic function, elevated LA pressure, normal RV - CXR no acute process, BNP 263 - negative 1.5L yesterdy, documented I/Os indciated +2.5L since admission. Weights appear inaccurate. Mild uptrend in Cr yesterday, Has been transitoned to oral lasix 40mg  bid with Cr back to normal - appears  euvolemic, continue oral lasix   2.Persistent afib - don dilt 240 for rate control, eliquis for stroke prevention - some elevated rates, bisoprolol added this admit up to 5mg  daily.  - would ambulate patient later this morning after meds as tachy occurred yesterday with ambulation, with her COPD and deconditioning would expect rates 110s or so with ambulation.   3. Left leg cellulitis - per primary team  4. COPD on home O2  Likely d/c later to day if does ok with ambulation. We will arrange f/u  For questions or updates, please contact [facility] Please consult [email or web address] for contact info under        Signed, Dina Rich, MD  09/23/2022, 8:17 AM

## 2022-09-25 DIAGNOSIS — J9601 Acute respiratory failure with hypoxia: Secondary | ICD-10-CM | POA: Diagnosis not present

## 2022-09-25 DIAGNOSIS — I5033 Acute on chronic diastolic (congestive) heart failure: Secondary | ICD-10-CM | POA: Diagnosis not present

## 2022-09-25 DIAGNOSIS — I4891 Unspecified atrial fibrillation: Secondary | ICD-10-CM | POA: Diagnosis not present

## 2022-10-01 DIAGNOSIS — I5031 Acute diastolic (congestive) heart failure: Secondary | ICD-10-CM | POA: Diagnosis not present

## 2022-10-01 DIAGNOSIS — I4891 Unspecified atrial fibrillation: Secondary | ICD-10-CM | POA: Diagnosis not present

## 2022-10-01 DIAGNOSIS — J449 Chronic obstructive pulmonary disease, unspecified: Secondary | ICD-10-CM | POA: Diagnosis not present

## 2022-10-01 DIAGNOSIS — L03116 Cellulitis of left lower limb: Secondary | ICD-10-CM | POA: Diagnosis not present

## 2022-10-01 DIAGNOSIS — E1165 Type 2 diabetes mellitus with hyperglycemia: Secondary | ICD-10-CM | POA: Diagnosis not present

## 2022-10-01 DIAGNOSIS — E039 Hypothyroidism, unspecified: Secondary | ICD-10-CM | POA: Diagnosis not present

## 2022-10-04 ENCOUNTER — Ambulatory Visit: Payer: Medicare HMO | Attending: Nurse Practitioner | Admitting: Nurse Practitioner

## 2022-10-04 ENCOUNTER — Encounter: Payer: Self-pay | Admitting: Nurse Practitioner

## 2022-10-04 VITALS — BP 116/68 | HR 62 | Ht 63.0 in | Wt 281.6 lb

## 2022-10-04 DIAGNOSIS — R42 Dizziness and giddiness: Secondary | ICD-10-CM | POA: Diagnosis not present

## 2022-10-04 DIAGNOSIS — I5032 Chronic diastolic (congestive) heart failure: Secondary | ICD-10-CM

## 2022-10-04 DIAGNOSIS — J449 Chronic obstructive pulmonary disease, unspecified: Secondary | ICD-10-CM | POA: Diagnosis not present

## 2022-10-04 DIAGNOSIS — R600 Localized edema: Secondary | ICD-10-CM | POA: Diagnosis not present

## 2022-10-04 DIAGNOSIS — I480 Paroxysmal atrial fibrillation: Secondary | ICD-10-CM

## 2022-10-04 DIAGNOSIS — R0789 Other chest pain: Secondary | ICD-10-CM

## 2022-10-04 DIAGNOSIS — I10 Essential (primary) hypertension: Secondary | ICD-10-CM | POA: Diagnosis not present

## 2022-10-04 DIAGNOSIS — K5900 Constipation, unspecified: Secondary | ICD-10-CM

## 2022-10-04 DIAGNOSIS — L0390 Cellulitis, unspecified: Secondary | ICD-10-CM

## 2022-10-04 DIAGNOSIS — E785 Hyperlipidemia, unspecified: Secondary | ICD-10-CM

## 2022-10-04 DIAGNOSIS — I25119 Atherosclerotic heart disease of native coronary artery with unspecified angina pectoris: Secondary | ICD-10-CM | POA: Diagnosis not present

## 2022-10-04 DIAGNOSIS — Z79899 Other long term (current) drug therapy: Secondary | ICD-10-CM

## 2022-10-04 MED ORDER — NITROGLYCERIN 0.4 MG SL SUBL: 0.4000 mg | SUBLINGUAL_TABLET | SUBLINGUAL | 3 refills | Status: DC | PRN

## 2022-10-04 MED ORDER — FUROSEMIDE 40 MG PO TABS: 40.0000 mg | ORAL_TABLET | Freq: Two times a day (BID) | ORAL | 11 refills | Status: DC

## 2022-10-04 MED ORDER — DOXYCYCLINE HYCLATE 100 MG PO CAPS: 100.0000 mg | ORAL_CAPSULE | Freq: Two times a day (BID) | ORAL | 0 refills | Status: DC
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
## 2022-10-04 NOTE — Patient Instructions (Addendum)
Medication Instructions:  Your physician recommends that you continue on your current medications as directed. Please refer to the Current Medication list given to you today.  Start Doxycycline 100 mg Two Times Daily   *If you need a refill on your cardiac medications before your next appointment, please call your pharmacy*   Lab Work: Your physician recommends that you return for lab work in: 1 Week   If you have labs (blood work) drawn today and your tests are completely normal, you will receive your results only by: MyChart Message (if you have MyChart) OR A paper copy in the mail If you have any lab test that is abnormal or we need to change your treatment, we will call you to review the results.   Testing/Procedures: NONE    Follow-Up: At [facility], you and your health needs are our priority.  As part of our continuing mission to provide you with exceptional heart care, we have created designated Provider Care Teams.  These Care Teams include your primary Cardiologist (physician) and Advanced Practice Providers (APPs -  Physician Assistants and Nurse Practitioners) who all work together to provide you with the care you need, when you need it.  We recommend signing up for the patient portal called "MyChart".  Sign up information is provided on this After Visit Summary.  MyChart is used to connect with patients for Virtual Visits (Telemedicine).  Patients are able to view lab/test results, encounter notes, upcoming appointments, etc.  Non-urgent messages can be sent to your provider as well.   To learn more about what you can do with MyChart, go to [email or web address].    Your next appointment:   2 week(s)  Provider:   Sharlene Dory, NP    Other Instructions Thank you for choosing [facility]!   How to Perform the Epley Maneuver The Epley maneuver is an exercise that relieves symptoms of vertigo. Vertigo is the feeling that you or your surroundings  are moving when they are not. When you feel vertigo, you may feel like the room is spinning and may have trouble walking. The Epley maneuver is used for a type of vertigo caused by a calcium deposit in a part of the inner ear. The maneuver involves changing head positions to help the deposit move out of the area. You can do this maneuver at home whenever you have symptoms of vertigo. You can repeat it in 24 hours if your vertigo has not gone away. Even though the Epley maneuver may relieve your vertigo for a few weeks, it is possible that your symptoms will return. This maneuver relieves vertigo, but it does not relieve dizziness. What are the risks? If it is done correctly, the Epley maneuver is considered safe. Sometimes it can lead to dizziness or nausea that goes away after a short time. If you develop other symptoms--such as changes in vision, weakness, or numbness--stop doing the maneuver and call your health care provider. Supplies needed: A bed or table. A pillow. How to do the Epley maneuver     Sit on the edge of a bed or table with your back straight and your legs extended or hanging over the edge of the bed or table. Turn your head halfway toward the affected ear or side as told by your health care provider. Lie backward quickly with your head turned until you are lying flat on your back. Your head should dangle (head-hanging position). You may want to position a pillow under your shoulders.  Hold this position for at least 30 seconds. If you feel dizzy or have symptoms of vertigo, continue to hold the position until the symptoms stop. Turn your head to the opposite direction until your unaffected ear is facing down. Your head should continue to dangle. Hold this position for at least 30 seconds. If you feel dizzy or have symptoms of vertigo, continue to hold the position until the symptoms stop. Turn your whole body to the same side as your head so that you are positioned on your side.  Your head will now be nearly facedown and no longer needs to dangle. Hold for at least 30 seconds. If you feel dizzy or have symptoms of vertigo, continue to hold the position until the symptoms stop. Sit back up. You can repeat the maneuver in 24 hours if your vertigo does not go away. Follow these instructions at home: For 24 hours after doing the Epley maneuver: Keep your head in an upright position. When lying down to sleep or rest, keep your head raised (elevated) with two or more pillows. Avoid excessive neck movements. Activity Do not drive or use machinery if you feel dizzy. After doing the Epley maneuver, return to your normal activities as told by your health care provider. Ask your health care provider what activities are safe for you. General instructions Drink enough fluid to keep your urine pale yellow. Do not drink alcohol. Take over-the-counter and prescription medicines only as told by your health care provider. Keep all follow-up visits. This is important. Preventing vertigo symptoms Ask your health care provider if there is anything you should do at home to prevent vertigo. He or she may recommend that you: Keep your head elevated with two or more pillows while you sleep. Do not sleep on the side of your affected ear. Get up slowly from bed. Avoid sudden movements during the day. Avoid extreme head positions or movement, such as looking up or bending over. Contact a health care provider if: Your vertigo gets worse. You have other symptoms, including: Nausea. Vomiting. Headache. Get help right away if you: Have vision changes. Have a headache or neck pain that is severe or getting worse. Cannot stop vomiting. Have new numbness or weakness in any part of your body. These symptoms may represent a serious problem that is an emergency. Do not wait to see if the symptoms will go away. Get medical help right away. Call your local emergency services (911 in the [place] Do  not drive yourself to the hospital. Summary Vertigo is the feeling that you or your surroundings are moving when they are not. The Epley maneuver is an exercise that relieves symptoms of vertigo. If the Epley maneuver is done correctly, it is considered safe. This information is not intended to replace advice given to you by your health care provider. Make sure you discuss any questions you have with your health care provider. Document Revised: 04/12/2020 Document Reviewed: 04/12/2020 Elsevier Patient Education  2023 [employer].

## 2022-10-09 DIAGNOSIS — E782 Mixed hyperlipidemia: Secondary | ICD-10-CM | POA: Diagnosis not present

## 2022-10-09 DIAGNOSIS — I5033 Acute on chronic diastolic (congestive) heart failure: Secondary | ICD-10-CM | POA: Diagnosis not present

## 2022-10-09 DIAGNOSIS — J449 Chronic obstructive pulmonary disease, unspecified: Secondary | ICD-10-CM | POA: Diagnosis not present

## 2022-10-09 DIAGNOSIS — I10 Essential (primary) hypertension: Secondary | ICD-10-CM | POA: Diagnosis not present

## 2022-10-09 DIAGNOSIS — E039 Hypothyroidism, unspecified: Secondary | ICD-10-CM | POA: Diagnosis not present

## 2022-10-09 DIAGNOSIS — E1165 Type 2 diabetes mellitus with hyperglycemia: Secondary | ICD-10-CM | POA: Diagnosis not present

## 2022-10-09 DIAGNOSIS — Z7951 Long term (current) use of inhaled steroids: Secondary | ICD-10-CM | POA: Diagnosis not present

## 2022-10-09 DIAGNOSIS — Z6841 Body Mass Index (BMI) 40.0 and over, adult: Secondary | ICD-10-CM | POA: Diagnosis not present

## 2022-10-09 DIAGNOSIS — L03116 Cellulitis of left lower limb: Secondary | ICD-10-CM | POA: Diagnosis not present

## 2022-10-09 DIAGNOSIS — I110 Hypertensive heart disease with heart failure: Secondary | ICD-10-CM | POA: Diagnosis not present

## 2022-10-09 DIAGNOSIS — D649 Anemia, unspecified: Secondary | ICD-10-CM | POA: Diagnosis not present

## 2022-10-09 DIAGNOSIS — Z794 Long term (current) use of insulin: Secondary | ICD-10-CM | POA: Diagnosis not present

## 2022-10-09 DIAGNOSIS — Z7901 Long term (current) use of anticoagulants: Secondary | ICD-10-CM | POA: Diagnosis not present

## 2022-10-09 DIAGNOSIS — I482 Chronic atrial fibrillation, unspecified: Secondary | ICD-10-CM | POA: Diagnosis not present

## 2022-10-09 DIAGNOSIS — J9611 Chronic respiratory failure with hypoxia: Secondary | ICD-10-CM | POA: Diagnosis not present

## 2022-10-10 ENCOUNTER — Ambulatory Visit (HOSPITAL_COMMUNITY): Payer: Medicare HMO

## 2022-10-11 ENCOUNTER — Ambulatory Visit: Payer: Medicaid - Dental | Admitting: Nurse Practitioner

## 2022-10-11 DIAGNOSIS — Z7951 Long term (current) use of inhaled steroids: Secondary | ICD-10-CM | POA: Diagnosis not present

## 2022-10-11 DIAGNOSIS — E782 Mixed hyperlipidemia: Secondary | ICD-10-CM | POA: Diagnosis not present

## 2022-10-11 DIAGNOSIS — E039 Hypothyroidism, unspecified: Secondary | ICD-10-CM | POA: Diagnosis not present

## 2022-10-11 DIAGNOSIS — I5033 Acute on chronic diastolic (congestive) heart failure: Secondary | ICD-10-CM | POA: Diagnosis not present

## 2022-10-11 DIAGNOSIS — J449 Chronic obstructive pulmonary disease, unspecified: Secondary | ICD-10-CM | POA: Diagnosis not present

## 2022-10-11 DIAGNOSIS — E1165 Type 2 diabetes mellitus with hyperglycemia: Secondary | ICD-10-CM | POA: Diagnosis not present

## 2022-10-11 DIAGNOSIS — Z794 Long term (current) use of insulin: Secondary | ICD-10-CM | POA: Diagnosis not present

## 2022-10-11 DIAGNOSIS — L03116 Cellulitis of left lower limb: Secondary | ICD-10-CM | POA: Diagnosis not present

## 2022-10-11 DIAGNOSIS — Z7901 Long term (current) use of anticoagulants: Secondary | ICD-10-CM | POA: Diagnosis not present

## 2022-10-11 DIAGNOSIS — J9611 Chronic respiratory failure with hypoxia: Secondary | ICD-10-CM | POA: Diagnosis not present

## 2022-10-11 DIAGNOSIS — I482 Chronic atrial fibrillation, unspecified: Secondary | ICD-10-CM | POA: Diagnosis not present

## 2022-10-11 DIAGNOSIS — I110 Hypertensive heart disease with heart failure: Secondary | ICD-10-CM | POA: Diagnosis not present

## 2022-10-11 DIAGNOSIS — Z6841 Body Mass Index (BMI) 40.0 and over, adult: Secondary | ICD-10-CM | POA: Diagnosis not present

## 2022-10-11 DIAGNOSIS — D649 Anemia, unspecified: Secondary | ICD-10-CM | POA: Diagnosis not present

## 2022-10-14 DIAGNOSIS — J441 Chronic obstructive pulmonary disease with (acute) exacerbation: Secondary | ICD-10-CM | POA: Diagnosis not present

## 2022-10-14 DIAGNOSIS — I10 Essential (primary) hypertension: Secondary | ICD-10-CM | POA: Diagnosis not present

## 2022-10-14 DIAGNOSIS — Z79899 Other long term (current) drug therapy: Secondary | ICD-10-CM | POA: Diagnosis not present

## 2022-10-14 DIAGNOSIS — N183 Chronic kidney disease, stage 3 unspecified: Secondary | ICD-10-CM | POA: Diagnosis not present

## 2022-10-14 DIAGNOSIS — I5032 Chronic diastolic (congestive) heart failure: Secondary | ICD-10-CM | POA: Diagnosis not present

## 2022-10-14 DIAGNOSIS — I480 Paroxysmal atrial fibrillation: Secondary | ICD-10-CM | POA: Diagnosis not present

## 2022-10-15 LAB — PRO B NATRIURETIC PEPTIDE: NT-Pro BNP: 2295 pg/mL — ABNORMAL HIGH (ref 0–738)

## 2022-10-15 LAB — BASIC METABOLIC PANEL
BUN/Creatinine Ratio: 17 (ref 12–28)
BUN: 16 mg/dL (ref 8–27)
CO2: 24 mmol/L (ref 20–29)
Calcium: 9.4 mg/dL (ref 8.7–10.3)
Chloride: 103 mmol/L (ref 96–106)
Creatinine, Ser: 0.96 mg/dL (ref 0.57–1.00)
Glucose: 179 mg/dL — ABNORMAL HIGH (ref 70–99)
Potassium: 4.5 mmol/L (ref 3.5–5.2)
Sodium: 143 mmol/L (ref 134–144)
eGFR: 61 mL/min/{1.73_m2} (ref >59)

## 2022-10-16 ENCOUNTER — Other Ambulatory Visit: Payer: Self-pay

## 2022-10-16 ENCOUNTER — Encounter: Payer: Self-pay | Admitting: Family Medicine

## 2022-10-16 ENCOUNTER — Encounter (HOSPITAL_COMMUNITY): Payer: Self-pay | Admitting: *Deleted

## 2022-10-16 ENCOUNTER — Observation Stay (HOSPITAL_COMMUNITY)
Admission: EM | Admit: 2022-10-16 | Discharge: 2022-10-17 | Disposition: A | Discharge status: Home / Self Care | Payer: Medicare HMO | Attending: Internal Medicine | Admitting: Internal Medicine

## 2022-10-16 ENCOUNTER — Ambulatory Visit (INDEPENDENT_AMBULATORY_CARE_PROVIDER_SITE_OTHER): Payer: Medicare HMO | Admitting: Family Medicine

## 2022-10-16 VITALS — BP 88/67 | HR 69 | Ht 63.0 in | Wt 281.0 lb

## 2022-10-16 DIAGNOSIS — Z13220 Encounter for screening for lipoid disorders: Secondary | ICD-10-CM

## 2022-10-16 DIAGNOSIS — I25718 Atherosclerosis of autologous vein coronary artery bypass graft(s) with other forms of angina pectoris: Secondary | ICD-10-CM | POA: Diagnosis not present

## 2022-10-16 DIAGNOSIS — K219 Gastro-esophageal reflux disease without esophagitis: Secondary | ICD-10-CM | POA: Diagnosis present

## 2022-10-16 DIAGNOSIS — J449 Chronic obstructive pulmonary disease, unspecified: Secondary | ICD-10-CM | POA: Diagnosis not present

## 2022-10-16 DIAGNOSIS — I959 Hypotension, unspecified: Secondary | ICD-10-CM

## 2022-10-16 DIAGNOSIS — Z7984 Long term (current) use of oral hypoglycemic drugs: Secondary | ICD-10-CM | POA: Insufficient documentation

## 2022-10-16 DIAGNOSIS — I480 Paroxysmal atrial fibrillation: Secondary | ICD-10-CM | POA: Diagnosis not present

## 2022-10-16 DIAGNOSIS — E039 Hypothyroidism, unspecified: Secondary | ICD-10-CM | POA: Diagnosis not present

## 2022-10-16 DIAGNOSIS — Z955 Presence of coronary angioplasty implant and graft: Secondary | ICD-10-CM | POA: Insufficient documentation

## 2022-10-16 DIAGNOSIS — D509 Iron deficiency anemia, unspecified: Secondary | ICD-10-CM

## 2022-10-16 DIAGNOSIS — I10 Essential (primary) hypertension: Secondary | ICD-10-CM | POA: Diagnosis present

## 2022-10-16 DIAGNOSIS — R7989 Other specified abnormal findings of blood chemistry: Secondary | ICD-10-CM | POA: Diagnosis not present

## 2022-10-16 DIAGNOSIS — E2740 Unspecified adrenocortical insufficiency: Secondary | ICD-10-CM | POA: Diagnosis present

## 2022-10-16 DIAGNOSIS — E1165 Type 2 diabetes mellitus with hyperglycemia: Secondary | ICD-10-CM | POA: Diagnosis not present

## 2022-10-16 DIAGNOSIS — I25118 Atherosclerotic heart disease of native coronary artery with other forms of angina pectoris: Secondary | ICD-10-CM | POA: Diagnosis present

## 2022-10-16 DIAGNOSIS — E538 Deficiency of other specified B group vitamins: Secondary | ICD-10-CM

## 2022-10-16 DIAGNOSIS — Z86718 Personal history of other venous thrombosis and embolism: Secondary | ICD-10-CM | POA: Insufficient documentation

## 2022-10-16 DIAGNOSIS — I110 Hypertensive heart disease with heart failure: Secondary | ICD-10-CM | POA: Insufficient documentation

## 2022-10-16 DIAGNOSIS — I952 Hypotension due to drugs: Secondary | ICD-10-CM | POA: Diagnosis not present

## 2022-10-16 DIAGNOSIS — Z87891 Personal history of nicotine dependence: Secondary | ICD-10-CM | POA: Diagnosis not present

## 2022-10-16 DIAGNOSIS — Z13820 Encounter for screening for osteoporosis: Secondary | ICD-10-CM

## 2022-10-16 DIAGNOSIS — I5032 Chronic diastolic (congestive) heart failure: Secondary | ICD-10-CM | POA: Diagnosis not present

## 2022-10-16 DIAGNOSIS — J9611 Chronic respiratory failure with hypoxia: Secondary | ICD-10-CM | POA: Diagnosis present

## 2022-10-16 DIAGNOSIS — E119 Type 2 diabetes mellitus without complications: Secondary | ICD-10-CM | POA: Diagnosis not present

## 2022-10-16 DIAGNOSIS — Z7901 Long term (current) use of anticoagulants: Secondary | ICD-10-CM | POA: Diagnosis not present

## 2022-10-16 DIAGNOSIS — E782 Mixed hyperlipidemia: Secondary | ICD-10-CM | POA: Diagnosis present

## 2022-10-16 DIAGNOSIS — Z79899 Other long term (current) drug therapy: Secondary | ICD-10-CM | POA: Insufficient documentation

## 2022-10-16 LAB — CBC WITH DIFFERENTIAL/PLATELET
Abs Immature Granulocytes: 0.06 10*3/uL (ref 0.00–0.07)
Basophils Absolute: 0.1 10*3/uL (ref 0.0–0.1)
Basophils Relative: 1 %
Eosinophils Absolute: 0.1 10*3/uL (ref 0.0–0.5)
Eosinophils Relative: 1 %
HCT: 32.2 % — ABNORMAL LOW (ref 36.0–46.0)
Hemoglobin: 9 g/dL — ABNORMAL LOW (ref 12.0–15.0)
Immature Granulocytes: 1 %
Lymphocytes Relative: 19 %
Lymphs Abs: 1.9 10*3/uL (ref 0.7–4.0)
MCH: 21.2 pg — ABNORMAL LOW (ref 26.0–34.0)
MCHC: 28 g/dL — ABNORMAL LOW (ref 30.0–36.0)
MCV: 75.8 fL — ABNORMAL LOW (ref 80.0–100.0)
Monocytes Absolute: 0.7 10*3/uL (ref 0.1–1.0)
Monocytes Relative: 7 %
Neutro Abs: 7.3 10*3/uL (ref 1.7–7.7)
Neutrophils Relative %: 71 %
Platelets: 250 10*3/uL (ref 150–400)
RBC: 4.25 MIL/uL (ref 3.87–5.11)
RDW: 20.2 % — ABNORMAL HIGH (ref 11.5–15.5)
WBC: 10.2 10*3/uL (ref 4.0–10.5)
nRBC: 0 % (ref 0.0–0.2)

## 2022-10-16 LAB — COMPREHENSIVE METABOLIC PANEL
ALT: 17 U/L (ref 0–44)
AST: 16 U/L (ref 15–41)
Albumin: 3.5 g/dL (ref 3.5–5.0)
Alkaline Phosphatase: 80 U/L (ref 38–126)
Anion gap: 11 (ref 5–15)
BUN: 22 mg/dL (ref 8–23)
CO2: 30 mmol/L (ref 22–32)
Calcium: 8.9 mg/dL (ref 8.9–10.3)
Chloride: 96 mmol/L — ABNORMAL LOW (ref 98–111)
Creatinine, Ser: 1 mg/dL (ref 0.44–1.00)
GFR, Estimated: 58 mL/min — ABNORMAL LOW (ref >60)
Glucose, Bld: 146 mg/dL — ABNORMAL HIGH (ref 70–99)
Potassium: 3.8 mmol/L (ref 3.5–5.1)
Sodium: 137 mmol/L (ref 135–145)
Total Bilirubin: 0.6 mg/dL (ref 0.3–1.2)
Total Protein: 7.4 g/dL (ref 6.5–8.1)

## 2022-10-16 LAB — BRAIN NATRIURETIC PEPTIDE: B Natriuretic Peptide: 371 pg/mL — ABNORMAL HIGH (ref 0.0–100.0)

## 2022-10-16 LAB — TROPONIN I (HIGH SENSITIVITY)
Troponin I (High Sensitivity): 20 ng/L — ABNORMAL HIGH (ref <18)
Troponin I (High Sensitivity): 24 ng/L — ABNORMAL HIGH (ref <18)

## 2022-10-16 LAB — LACTIC ACID, PLASMA: Lactic Acid, Venous: 1.1 mmol/L (ref 0.5–1.9)

## 2022-10-16 MED ORDER — FREESTYLE LIBRE 2 SENSOR MISC
3 refills | Status: DC
Start: 2022-10-16 — End: 2023-03-16

## 2022-10-16 NOTE — ED Provider Notes (Signed)
[place] [facility]ERGENCY DEPARTMENT AT [facility] Provider Note   CSN: [id number] Arrival date & time: 10/16/22  1625     History  Chief Complaint  Patient presents with   Hypotension    Ann [name] is a 77 y.o. female.  HPI    77 year old female comes in with chief complaint of hypertension.  Patient had gone to see her PCP after being discharged from the hospital, and they found her to be hypotensive and sent her to the emergency room.  Patient has known history of A-fib, CHF, COPD, diabetes, CAD and hypothyroidism.  At her recent admission, she was diagnosed with CHF and bisoprolol was added to her regimen.  Patient states that she has slight dizziness, but symptoms were not pronounced when she went to the PCP today for a routine visit.  She denies any chest pain, shortness of breath, near fainting, nausea, vomiting, diarrhea.  Home Medications Prior to Admission medications   Medication Sig Start Date End Date Taking? Authorizing Provider  acetaminophen (TYLENOL) 325 MG tablet Take 2 tablets (650 mg total) by mouth every 6 (six) hours as needed for mild pain, moderate pain or fever. 12/11/19  Yes Emokpae, Courage, MD  albuterol (VENTOLIN HFA) 108 (90 Base) MCG/ACT inhaler Inhale 2 puffs into the lungs every 4 (four) hours as needed for wheezing. 05/31/20  Yes Angiulli, Mcarthur Rossetti, PA-C  apixaban (ELIQUIS) 5 MG TABS tablet Take 1 tablet (5 mg total) by mouth 2 (two) times daily. 05/31/20 10/17/68 Yes Angiulli, Mcarthur Rossetti, PA-C  atorvastatin (LIPITOR) 80 MG tablet Take 1 tablet (80 mg total) by mouth daily at 6 PM. Patient taking differently: Take 80 mg by mouth daily. 05/31/20  Yes Angiulli, Mcarthur Rossetti, PA-C  bisoprolol (ZEBETA) 5 MG tablet Take 1 tablet (5 mg total) by mouth daily. 09/24/22 12/23/22 Yes Narda Bonds, MD  Carboxymethylcellulose Sod PF 0.5 % SOLN Place 2 drops into both eyes daily as needed (for dry eye relief).    Yes [provider]  Cholecalciferol  (VITAMIN D3) 125 MCG (5000 UT) CAPS Take 1 capsule (5,000 Units total) by mouth daily. 05/31/20  Yes Angiulli, Mcarthur Rossetti, PA-C  cyanocobalamin 1000 MCG tablet Take 1 tablet (1,000 mcg total) by mouth daily. Patient taking differently: Take 2,500 mcg by mouth daily. 05/31/20  Yes Angiulli, Mcarthur Rossetti, PA-C  diltiazem (CARDIZEM CD) 240 MG 24 hr capsule Take 1 capsule (240 mg total) by mouth daily. 06/01/20  Yes Angiulli, Mcarthur Rossetti, PA-C  doxycycline (VIBRAMYCIN) 100 MG capsule Take 1 capsule (100 mg total) by mouth 2 (two) times daily. 10/04/22  Yes Sharlene Dory, NP  esomeprazole (NEXIUM) 40 MG capsule Take 1 capsule (40 mg total) by mouth 2 (two) times daily before a meal. 10/25/21 10/25/22 Yes Carver, Hennie Duos, DO  furosemide (LASIX) 40 MG tablet Take 1 tablet (40 mg total) by mouth 2 (two) times daily. 10/04/22 01/02/23 Yes Sharlene Dory, NP  gabapentin (NEURONTIN) 400 MG capsule Take 1 capsule (400 mg total) by mouth 3 (three) times daily. 05/31/20  Yes Angiulli, Mcarthur Rossetti, PA-C  glipiZIDE (GLUCOTROL XL) 5 MG 24 hr tablet Take 1 tablet (5 mg total) by mouth daily with breakfast. 11/07/21  Yes Nida, Denman George, MD  Insulin Lispro Prot & Lispro (HUMALOG MIX 75/25 KWIKPEN) (75-25) 100 UNIT/ML Kwikpen Inject 40-50 Units into the skin 2 (two) times daily before a meal. 08/21/22  Yes Nida, Denman George, MD  ipratropium-albuterol (DUONEB) 0.5-2.5 (3) MG/3ML SOLN Take 3 mLs  by nebulization every 6 (six) hours as needed (copd). 03/17/20  Yes [provider]  levothyroxine (SYNTHROID) 175 MCG tablet Take 1 tablet (175 mcg total) by mouth every morning. 11/16/20  Yes Nida, Denman George, MD  loratadine (CLARITIN) 10 MG tablet Take 10 mg by mouth daily.   Yes [provider]  Menthol-Methyl Salicylate (SALONPAS PAIN RELIEF PATCH) PTCH Apply 1 patch topically daily as needed (pain).   Yes [provider]  nitroGLYCERIN (NITROSTAT) 0.4 MG SL tablet Place 1 tablet (0.4 mg total) under the tongue  every 5 (five) minutes as needed for chest pain. 10/04/22  Yes Sharlene Dory, NP  predniSONE (DELTASONE) 10 MG tablet Take 1 tablet (10 mg total) by mouth daily with breakfast. 11/16/20  Yes Nida, Denman George, MD  solifenacin (VESICARE) 10 MG tablet Take 10 mg by mouth at bedtime.    Yes [provider]  TRELEGY ELLIPTA 100-62.5-25 MCG/ACT AEPB Take 1 puff by mouth daily. 10/14/22  Yes [provider]  Vibegron (GEMTESA) 75 MG TABS Take 1 tablet (75 mg total) by mouth daily. 08/19/22  Yes McKenzie, Mardene Celeste, MD  budesonide-formoterol (SYMBICORT) 80-4.5 MCG/ACT inhaler Take 2 puffs first thing in am and then another 2 puffs about 12 hours later. 09/09/22   Nyoka Cowden, MD  Continuous Glucose Sensor (FREESTYLE LIBRE 2 SENSOR) MISC Use to check glucose as directed. Change sensor every 14 days. 10/16/22   Del Nigel Berthold, FNP  ondansetron (ZOFRAN) 4 MG tablet Take 4 mg by mouth every 6 (six) hours as needed. Patient not taking: Reported on 10/16/2022 09/25/22   [provider]      Allergies    Ace inhibitors, Asa [aspirin], Breztri aerosphere [budeson-glycopyrrol-formoterol], Tape, Niacin, and Reglan [metoclopramide]    Review of Systems   Review of Systems  All other systems reviewed and are negative.   Physical Exam Updated Vital Signs BP (!) 159/82   Pulse (!) 56   Temp 98.1 F (36.7 C) (Oral)   Resp 19   SpO2 98%  Physical Exam Vitals and nursing note reviewed.  Constitutional:      Appearance: She is well-developed.  HENT:     Head: Normocephalic and atraumatic.  Eyes:     Extraocular Movements: Extraocular movements intact.  Cardiovascular:     Rate and Rhythm: Normal rate.  Pulmonary:     Effort: Pulmonary effort is normal.  Musculoskeletal:        General: Swelling present.     Cervical back: Normal range of motion and neck supple.     Right lower leg: Edema present.     Left lower leg: Edema present.  Skin:    General: Skin is  dry.  Neurological:     Mental Status: She is alert and oriented to person, place, and time.     ED Results / Procedures / Treatments   Labs (all labs ordered are listed, but only abnormal results are displayed) Labs Reviewed  COMPREHENSIVE METABOLIC PANEL - Abnormal; Notable for the following components:      Result Value   Chloride 96 (*)    Glucose, Bld 146 (*)    GFR, Estimated 58 (*)    All other components within normal limits  CBC WITH DIFFERENTIAL/PLATELET - Abnormal; Notable for the following components:   Hemoglobin 9.0 (*)    HCT 32.2 (*)    MCV 75.8 (*)    MCH 21.2 (*)    MCHC 28.0 (*)    RDW  20.2 (*)    All other components within normal limits  BRAIN NATRIURETIC PEPTIDE - Abnormal; Notable for the following components:   B Natriuretic Peptide 371.0 (*)    All other components within normal limits  TROPONIN I (HIGH SENSITIVITY) - Abnormal; Notable for the following components:   Troponin I (High Sensitivity) 20 (*)    All other components within normal limits  LACTIC ACID, PLASMA  TROPONIN I (HIGH SENSITIVITY)    EKG EKG Interpretation  Date/Time:  Wednesday Oct 16 2022 21:36:10 EDT Ventricular Rate:  69 PR Interval:    QRS Duration: 90 QT Interval:  401 QTC Calculation: 430 R Axis:   62 Text Interpretation: Atrial fibrillation Low voltage, precordial leads Repol abnrm suggests ischemia, anterolateral TWI are not new No significant change since last tracing Confirmed by Derwood Kaplan [phone]) on 10/16/2022 10:03:33 PM  Radiology No results found.  Procedures .Critical Care  Performed by: Derwood Kaplan, MD Authorized by: Derwood Kaplan, MD   Critical care provider statement:    Critical care time (minutes):  37   Critical care was necessary to treat or prevent imminent or life-threatening deterioration of the following conditions:  Circulatory failure   Critical care was time spent personally by me on the following activities:  Development of  treatment plan with patient or surrogate, discussions with consultants, evaluation of patient's response to treatment, examination of patient, ordering and review of laboratory studies, ordering and review of radiographic studies, ordering and performing treatments and interventions, pulse oximetry, re-evaluation of patient's condition, review of old charts and obtaining history from patient or surrogate     Medications Ordered in ED Medications - No data to display  ED Course/ Medical Decision Making/ A&P                             Medical Decision Making Amount and/or Complexity of Data Reviewed Labs: ordered.   This patient presents to the ED with chief complaint(s) of hypotension with pertinent past medical history of A-fib, CHF, COPD, diabetes, recent admission to the hospital for acute on chronic CHF.The complaint involves an extensive differential diagnosis and also carries with it a high risk of complications and morbidity.    The differential diagnosis includes acute CHF, acute coronary syndrome, new renal failure causing worsening hypotension due to medication toxicity, medication side effect, dehydration, AKI, severe electrolyte abnormality  The initial plan is to get basic labs.  Patient indicates that she has been feeling dizzy and weak ever since she was discharged.  Her BP at the clinic was in the 70s.  At arrival, her BP in the emergency room was also in the 70s, but has gradually risen.  She denies any UTI-like symptoms, abdominal pain, vomiting, diarrhea, chest pain, cough -therefore not getting any sense of underlying infection as a cause or dehydration.   Additional history obtained: Additional history obtained from family Records reviewed previous admission documents  Independent labs interpretation:  The following labs were independently interpreted: CBC, CMP are normal. BNP slightly elevated.  No AKI. Lactic acid ordered, it is negative for acute  changes.   Treatment and Reassessment: Patient reassessed.  We have reviewed patient's medication.  Initially it appeared that she was not taking diltiazem 240 mg.  Thereafter we discovered that patient is actually taking it, it has been prescribed by the [place]. Patient was started on bisoprolol 5 mg while she was in the hospital.  Her Lasix is unchanged.  Patient was quite hypotensive at arrival, she is symptomatic with it, there are new medications that were adjusted recently-it would be best to admit the patient as an observation to ensure hemodynamic stability and to ensure there is no toxicity from her medications.  She has a history of adrenal insufficiency, this does not appear to be related to that.    Final Clinical Impression(s) / ED Diagnoses Final diagnoses:  Hypotension due to drugs    Rx / DC Orders ED Discharge Orders     None         Derwood Kaplan, MD 10/16/22 2327

## 2022-10-16 NOTE — Patient Instructions (Addendum)
      Please Visit ED for Hypotension ASAP I have refilled the medication(s) we provide.    If labs were collected, we will inform you of lab results once received either by echart message or telephone call.   - echart message- for normal results that have been seen by the patient already.   - telephone call: abnormal results or if patient has not viewed results in their echart.   - Please take medications as prescribed. - Follow up with your primary health provider if any health concerns arises. - If symptoms worsen please contact your primary care provider and/or visit the emergency department.

## 2022-10-16 NOTE — Assessment & Plan Note (Addendum)
Vitals:   10/16/22 1428 10/16/22 1441 10/16/22 1500  BP: (!) 78/41 (!) 78/52 (!) 88/67  CHF patient with Hypotension, Labs ordered. Dicussed to Hold off Lasix 40 mg  Advise patient to Visit ED for CHF hypotension crisis for immediate care.
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
## 2022-10-16 NOTE — ED Triage Notes (Signed)
Pt sent to ED for low pressure blood pressure, 70's SBP.  Denies any pain, + nausea

## 2022-10-17 DIAGNOSIS — I959 Hypotension, unspecified: Secondary | ICD-10-CM | POA: Diagnosis not present

## 2022-10-17 DIAGNOSIS — E782 Mixed hyperlipidemia: Secondary | ICD-10-CM

## 2022-10-17 DIAGNOSIS — E1165 Type 2 diabetes mellitus with hyperglycemia: Secondary | ICD-10-CM | POA: Diagnosis not present

## 2022-10-17 DIAGNOSIS — I10 Essential (primary) hypertension: Secondary | ICD-10-CM

## 2022-10-17 DIAGNOSIS — K219 Gastro-esophageal reflux disease without esophagitis: Secondary | ICD-10-CM | POA: Diagnosis not present

## 2022-10-17 DIAGNOSIS — R7989 Other specified abnormal findings of blood chemistry: Secondary | ICD-10-CM | POA: Diagnosis not present

## 2022-10-17 DIAGNOSIS — E2740 Unspecified adrenocortical insufficiency: Secondary | ICD-10-CM | POA: Diagnosis not present

## 2022-10-17 DIAGNOSIS — Z794 Long term (current) use of insulin: Secondary | ICD-10-CM | POA: Diagnosis not present

## 2022-10-17 DIAGNOSIS — J9611 Chronic respiratory failure with hypoxia: Secondary | ICD-10-CM | POA: Diagnosis not present

## 2022-10-17 DIAGNOSIS — I25118 Atherosclerotic heart disease of native coronary artery with other forms of angina pectoris: Secondary | ICD-10-CM | POA: Diagnosis not present

## 2022-10-17 DIAGNOSIS — I480 Paroxysmal atrial fibrillation: Secondary | ICD-10-CM

## 2022-10-17 LAB — CBC WITH DIFFERENTIAL/PLATELET
Abs Immature Granulocytes: 0.05 10*3/uL (ref 0.00–0.07)
Basophils Absolute: 0.1 10*3/uL (ref 0.0–0.2)
Basophils Absolute: 0 10*3/uL (ref 0.0–0.1)
Basophils Relative: 1 %
Eos: 1 %
Eosinophils Absolute: 0.2 10*3/uL (ref 0.0–0.5)
Eosinophils Relative: 2 %
HCT: 29.7 % — ABNORMAL LOW (ref 36.0–46.0)
Hematocrit: 31.6 % — ABNORMAL LOW (ref 34.0–46.6)
Hemoglobin: 9 g/dL — ABNORMAL LOW (ref 11.1–15.9)
Hemoglobin: 8.3 g/dL — ABNORMAL LOW (ref 12.0–15.0)
Immature Grans (Abs): 0 10*3/uL (ref 0.0–0.1)
Immature Granulocytes: 0 %
Immature Granulocytes: 1 %
Lymphocytes Absolute: 1.8 10*3/uL (ref 0.7–3.1)
Lymphocytes Relative: 25 %
Lymphs Abs: 2.2 10*3/uL (ref 0.7–4.0)
Lymphs: 13 %
MCH: 21.3 pg — ABNORMAL LOW (ref 26.0–34.0)
MCHC: 28.5 g/dL — ABNORMAL LOW (ref 31.5–35.7)
MCHC: 27.9 g/dL — ABNORMAL LOW (ref 30.0–36.0)
MCV: 73 fL — ABNORMAL LOW (ref 79–97)
MCV: 76.2 fL — ABNORMAL LOW (ref 80.0–100.0)
Monocytes Absolute: 0.7 10*3/uL (ref 0.1–1.0)
Monocytes Relative: 8 %
Monocytes: 6 %
Neutro Abs: 5.6 10*3/uL (ref 1.7–7.7)
Neutrophils Relative %: 63 %
Platelets: 236 10*3/uL (ref 150–450)
Platelets: 211 10*3/uL (ref 150–400)
RBC: 4.34 x10E6/uL (ref 3.77–5.28)
RBC: 3.9 MIL/uL (ref 3.87–5.11)
RDW: 19.8 % — ABNORMAL HIGH (ref 11.5–15.5)
WBC: 8.7 10*3/uL (ref 4.0–10.5)
nRBC: 0 % (ref 0.0–0.2)

## 2022-10-17 LAB — COMPREHENSIVE METABOLIC PANEL
ALT: 14 U/L (ref 0–44)
AST: 15 U/L (ref 15–41)
Albumin: 3 g/dL — ABNORMAL LOW (ref 3.5–5.0)
Alkaline Phosphatase: 70 U/L (ref 38–126)
Anion gap: 9 (ref 5–15)
BUN: 21 mg/dL (ref 8–23)
CO2: 29 mmol/L (ref 22–32)
Calcium: 9.1 mg/dL (ref 8.9–10.3)
Chloride: 100 mmol/L (ref 98–111)
Creatinine, Ser: 1.02 mg/dL — ABNORMAL HIGH (ref 0.44–1.00)
GFR, Estimated: 57 mL/min — ABNORMAL LOW (ref >60)
Glucose, Bld: 198 mg/dL — ABNORMAL HIGH (ref 70–99)
Potassium: 3.6 mmol/L (ref 3.5–5.1)
Sodium: 138 mmol/L (ref 135–145)
Total Bilirubin: 0.5 mg/dL (ref 0.3–1.2)
Total Protein: 6.6 g/dL (ref 6.5–8.1)

## 2022-10-17 LAB — URINALYSIS, ROUTINE W REFLEX MICROSCOPIC
Bilirubin Urine: NEGATIVE
Glucose, UA: NEGATIVE mg/dL
Hgb urine dipstick: NEGATIVE
Ketones, ur: NEGATIVE mg/dL
Leukocytes,Ua: NEGATIVE
Nitrite: NEGATIVE
Protein, ur: NEGATIVE mg/dL
Specific Gravity, Urine: 1.009 (ref 1.005–1.030)
pH: 6 (ref 5.0–8.0)

## 2022-10-17 LAB — BASIC METABOLIC PANEL
BUN/Creatinine Ratio: 19 (ref 12–28)
CO2: 21 mmol/L (ref 20–29)
Calcium: 9.2 mg/dL (ref 8.7–10.3)
Chloride: 97 mmol/L (ref 96–106)
Creatinine, Ser: 1.17 mg/dL — ABNORMAL HIGH (ref 0.57–1.00)
Potassium: 4.6 mmol/L (ref 3.5–5.2)

## 2022-10-17 LAB — MAGNESIUM: Magnesium: 1.6 mg/dL — ABNORMAL LOW (ref 1.7–2.4)

## 2022-10-17 LAB — GLUCOSE, CAPILLARY
Glucose-Capillary: 142 mg/dL — ABNORMAL HIGH (ref 70–99)
Glucose-Capillary: 171 mg/dL — ABNORMAL HIGH (ref 70–99)
Glucose-Capillary: 216 mg/dL — ABNORMAL HIGH (ref 70–99)
Glucose-Capillary: 226 mg/dL — ABNORMAL HIGH (ref 70–99)

## 2022-10-17 LAB — IRON,TIBC AND FERRITIN PANEL
Total Iron Binding Capacity: 397 ug/dL (ref 250–450)
UIBC: 372 ug/dL — ABNORMAL HIGH (ref 118–369)

## 2022-10-17 LAB — MICROALBUMIN / CREATININE URINE RATIO

## 2022-10-17 LAB — LIPID PANEL
Cholesterol, Total: 127 mg/dL (ref 100–199)
VLDL Cholesterol Cal: 23 mg/dL (ref 5–40)

## 2022-10-17 LAB — FOLATE

## 2022-10-17 LAB — TSH: TSH: 5.178 u[IU]/mL — ABNORMAL HIGH (ref 0.350–4.500)

## 2022-10-17 MED ORDER — FLUTICASONE FUROATE-VILANTEROL 100-25 MCG/ACT IN AEPB: 1.0000 | INHALATION_SPRAY | Freq: Every day | RESPIRATORY_TRACT | Status: DC

## 2022-10-17 MED ORDER — MAGNESIUM SULFATE IN D5W 1-5 GM/100ML-% IV SOLN
1.0000 g | Freq: Once | INTRAVENOUS | Status: AC
  Administered 2022-10-17: 1 g via INTRAVENOUS
  Filled 2022-10-17: qty 100

## 2022-10-17 MED ORDER — MOMETASONE FURO-FORMOTEROL FUM 100-5 MCG/ACT IN AERO
2.0000 | INHALATION_SPRAY | Freq: Two times a day (BID) | RESPIRATORY_TRACT | Status: DC
  Administered 2022-10-17: 2 via RESPIRATORY_TRACT
  Filled 2022-10-17: qty 8.8

## 2022-10-17 MED ORDER — ATORVASTATIN CALCIUM 40 MG PO TABS
80.0000 mg | ORAL_TABLET | Freq: Every day | ORAL | Status: DC
  Administered 2022-10-17: 80 mg via ORAL
  Filled 2022-10-17: qty 2

## 2022-10-17 MED ORDER — INSULIN ASPART 100 UNIT/ML IJ SOLN
0.0000 [IU] | INTRAMUSCULAR | Status: DC
  Administered 2022-10-17: 7 [IU] via SUBCUTANEOUS
  Administered 2022-10-17: 5 [IU] via SUBCUTANEOUS
  Administered 2022-10-17: 7 [IU] via SUBCUTANEOUS
  Administered 2022-10-17: 3 [IU] via SUBCUTANEOUS

## 2022-10-17 MED ORDER — BISOPROLOL FUMARATE 5 MG PO TABS
5.0000 mg | ORAL_TABLET | Freq: Every day | ORAL | Status: DC
  Administered 2022-10-17: 5 mg via ORAL
  Filled 2022-10-17: qty 1

## 2022-10-17 MED ORDER — APIXABAN 5 MG PO TABS
5.0000 mg | ORAL_TABLET | Freq: Two times a day (BID) | ORAL | Status: DC
  Administered 2022-10-17 (×2): 5 mg via ORAL
  Filled 2022-10-17 (×2): qty 1

## 2022-10-17 MED ORDER — LEVOTHYROXINE SODIUM 75 MCG PO TABS
175.0000 ug | ORAL_TABLET | Freq: Every morning | ORAL | Status: DC
  Administered 2022-10-17: 175 ug via ORAL
  Filled 2022-10-17: qty 1

## 2022-10-17 MED ORDER — UMECLIDINIUM BROMIDE 62.5 MCG/ACT IN AEPB: 1.0000 | INHALATION_SPRAY | Freq: Every day | RESPIRATORY_TRACT | Status: DC

## 2022-10-17 MED ORDER — MIRABEGRON ER 25 MG PO TB24
25.0000 mg | ORAL_TABLET | Freq: Every day | ORAL | Status: DC
  Administered 2022-10-17: 25 mg via ORAL
  Filled 2022-10-17: qty 1

## 2022-10-17 MED ORDER — ALBUTEROL SULFATE (2.5 MG/3ML) 0.083% IN NEBU: 2.5000 mg | INHALATION_SOLUTION | RESPIRATORY_TRACT | Status: DC | PRN

## 2022-10-17 MED ORDER — ALBUTEROL SULFATE HFA 108 (90 BASE) MCG/ACT IN AERS: 2.0000 | INHALATION_SPRAY | RESPIRATORY_TRACT | Status: DC | PRN

## 2022-10-17 MED ORDER — PANTOPRAZOLE SODIUM 40 MG PO TBEC
40.0000 mg | DELAYED_RELEASE_TABLET | Freq: Every day | ORAL | Status: DC
  Administered 2022-10-17: 40 mg via ORAL
  Filled 2022-10-17: qty 1

## 2022-10-17 MED ORDER — GABAPENTIN 300 MG PO CAPS
400.0000 mg | ORAL_CAPSULE | Freq: Three times a day (TID) | ORAL | Status: DC
  Administered 2022-10-17: 400 mg via ORAL
  Filled 2022-10-17: qty 1

## 2022-10-17 MED ORDER — DILTIAZEM HCL ER COATED BEADS 120 MG PO CP24: 120.0000 mg | ORAL_CAPSULE | Freq: Every day | ORAL | 1 refills | Status: DC

## 2022-10-17 MED ORDER — ACETAMINOPHEN 650 MG RE SUPP: 650.0000 mg | Freq: Four times a day (QID) | RECTAL | Status: DC | PRN

## 2022-10-17 MED ORDER — DILTIAZEM HCL ER COATED BEADS 120 MG PO CP24
120.0000 mg | ORAL_CAPSULE | Freq: Every day | ORAL | Status: DC
  Administered 2022-10-17: 120 mg via ORAL
  Filled 2022-10-17: qty 1

## 2022-10-17 MED ORDER — ACETAMINOPHEN 325 MG PO TABS
650.0000 mg | ORAL_TABLET | Freq: Four times a day (QID) | ORAL | Status: DC | PRN
  Administered 2022-10-17: 650 mg via ORAL
  Filled 2022-10-17 (×2): qty 2

## 2022-10-17 MED ORDER — PREDNISONE 10 MG PO TABS
10.0000 mg | ORAL_TABLET | Freq: Every day | ORAL | Status: DC
  Administered 2022-10-17: 10 mg via ORAL
  Filled 2022-10-17: qty 1

## 2022-10-17 NOTE — Assessment & Plan Note (Addendum)
-   Patient with underlying history of adrenal insufficiency -Continue chronic prednisone -Continue patient follow-up with Dr. Fransico Him -Continue adjusted dose of Cardizem and an current dose of bisoprolol. -Advised to maintain adequate hydration and to follow-up with PCP to further adjustment to antihypertensive agents based on blood pressure fluctuation. -If needed patient might require the use of midodrine along with her prednisone.

## 2022-10-17 NOTE — Assessment & Plan Note (Signed)
-  Body mass index is 49.56 kg/m. -Low-calorie diet and portion control discussed with patient.

## 2022-10-17 NOTE — Assessment & Plan Note (Addendum)
-   Resume home hypoglycemic regimen -Continue patient follow-up with endocrinology.

## 2022-10-17 NOTE — Assessment & Plan Note (Addendum)
-   Troponin elevated at 20, 24 which is actually lower than at the last admission which was 32, 32 - No chest pain - No acute ischemic changes appreciated on EKG or telemetry -Continue risk factor modifications and current home meds -Continue patient follow-up with cardiology service.

## 2022-10-17 NOTE — Assessment & Plan Note (Addendum)
-   Continue daily steroid - No reported missed doses - Continue to monitor and continue outpatient follow-up with endocrinology service.

## 2022-10-17 NOTE — Assessment & Plan Note (Addendum)
Continue PPI ?

## 2022-10-17 NOTE — TOC CM/SW Note (Signed)
Transition of Care [facility]) - Inpatient Brief Assessment   Patient Details  Name: Ann [name] MRN: [id number] Date of Birth: July 20, 1945  Transition of Care [facility]) CM/SW Contact:    Annice Needy, LCSW Phone Number: 10/17/2022, 1:13 PM   Clinical Narrative:  Transition of Care Department [facility]) has reviewed patient and no TOC needs have been identified at this time. We will continue to monitor patient advancement through interdisciplinary progression rounds. If new patient transition needs arise, please place a TOC consult.       Transition of Care Asessment: Insurance and Status: Insurance coverage has been reviewed Patient has primary care physician: Yes Home environment has been reviewed: yes Prior level of function:: currently needs assistance with bathing and toileting which is a change from baseline. Prior/Current Home Services: Current home services (Home PT and RN) Social Determinants of Health Reivew: SDOH reviewed no interventions necessary Readmission risk has been reviewed: Yes Transition of care needs: no transition of care needs at this time

## 2022-10-17 NOTE — Assessment & Plan Note (Addendum)
Continue statin 

## 2022-10-17 NOTE — Plan of Care (Signed)

## 2022-10-17 NOTE — Assessment & Plan Note (Addendum)
-   On baseline O2 -Continue home bronchodilator management.

## 2022-10-17 NOTE — Assessment & Plan Note (Addendum)
-   Continue Zebeta and reduced dose of diltiazem - Normal TSH appreciated -Heart healthy diet discussed with patient -Continue outpatient follow-up with PCP and endocrinologist.

## 2022-10-17 NOTE — Discharge Summary (Signed)
Physician Discharge Summary   Patient: Ann [name] MRN: [id number] DOB: Dec 02, 1945  Admit date:     10/16/2022  Discharge date: 10/17/22  Discharge Physician: Vassie Loll   PCP: Rica Records, FNP   Recommendations at discharge:  Reassess blood pressure and adjust antihypertensive agents as needed Repeat basic metabolic panel to follow ultralights renal function Continue to follow CBGs fluctuation and adjust hypoglycemic regimen as required.  Discharge Diagnoses: Principal Problem:   Hypotension Active Problems:   Mixed hyperlipidemia   Essential hypertension   Adrenal insufficiency (HCC)   Class 3 obesity (HCC)   Elevated troponin   GERD without esophagitis   PAF (paroxysmal atrial fibrillation) (HCC)   Coronary artery disease of native artery of native heart with stable angina pectoris (HCC)   Controlled type 2 diabetes mellitus with hyperglycemia, with long-term current use of insulin (HCC)   Chronic respiratory failure with hypoxia [facility])  Brief Hospital admission narrative: As per H&P written by Dr. Carren Rang on 10/17/22 Ann [name] is a 77 y.o. female with medical history significant of adrenal insufficiency, anxiety, paroxysmal atrial fibrillation, coronary artery disease, chronic diastolic CHF, diabetes mellitus type 2, GERD, hyperlipidemia, hypothyroidism, and more presents to the ED with a chief complaint of hypotension.  Patient reports that she went to her PCP today for follow-up.  Her blood pressure was low and her PCP was concerned that it would cause her A-fib to flare so she sent her to the ER.  This is the patient's report.  Patient reports that she was having no symptoms.  She also reports that she was lightheaded and nauseous.  She reports that he gets lightheaded when she goes from sitting to standing.  It has been this way ever since she was discharged.  She is not sure if any of her hypertension medications were recently changed in their  dose.  She reports no missed doses.  She does admit to dyspnea but it is no worse than her normal.  She is on 2 L nasal cannula at baseline.  She denies chest pain, cough, fever, dysuria.  Patient has no other complaints at this time.   Patient does not smoke, does not drink, does not use illicit drugs.  She is vaccinated for COVID and flu.   Patient was recently discharged on April 29. Patient presented to that hospital admission for leg swelling and shortness of breath. She was found to have acute heart failure and left leg cellulitis. She had atrial fibrillation with RVR at that time which improved with home dose of diltiazem and addition of bisoprolol. Patient was discharged with cardiology follow-up.   Assessment and Plan: * Hypotension - Patient with underlying history of adrenal insufficiency -Continue chronic prednisone -Continue patient follow-up with Dr. Fransico Him -Continue adjusted dose of Cardizem and an current dose of bisoprolol. -Advised to maintain adequate hydration and to follow-up with PCP to further adjustment to antihypertensive agents based on blood pressure fluctuation. -If needed patient might require the use of midodrine along with her prednisone.  Chronic respiratory failure with hypoxia (HCC) - On baseline O2 -Continue home bronchodilator management.   Controlled type 2 diabetes mellitus with hyperglycemia, with long-term current use of insulin (HCC) - Resume home hypoglycemic regimen -Continue patient follow-up with endocrinology.  Coronary artery disease of native artery of native heart with stable angina pectoris (HCC) - Continue statin, beta-blocker - No clinical signs of acute exacerbation at this time -Continue outpatient follow-up with cardiology service -Heart healthy diet discussed  with patient.  PAF (paroxysmal atrial fibrillation) (HCC) - Continue diltiazem at reduced dose and the use of bisoprolol. -Continue Eliquis for secondary prevention.    GERD  without esophagitis -Continue PPI  Elevated troponin - Troponin elevated at 20, 24 which is actually lower than at the last admission which was 32, 32 - No chest pain - No acute ischemic changes appreciated on EKG or telemetry -Continue risk factor modifications and current home meds -Continue patient follow-up with cardiology service.  Class 3 obesity (HCC) -Body mass index is 49.56 kg/m. -Low-calorie diet and portion control discussed with patient.  Adrenal insufficiency (HCC) - Continue daily steroid - No reported missed doses - Continue to monitor and continue outpatient follow-up with endocrinology service.  Essential hypertension - Continue Zebeta and reduced dose of diltiazem - Normal TSH appreciated -Heart healthy diet discussed with patient -Continue outpatient follow-up with PCP and endocrinologist.  Mixed hyperlipidemia - Continue statin -Heart healthy diet discussed with patient.   Consultants: None Procedures performed: See below for x-ray reports. Disposition: Home Diet recommendation: Heart healthy, low calorie and modified carbohydrate diet.  DISCHARGE MEDICATION: Allergies as of 10/17/2022       Reactions   Ace Inhibitors Other (See Comments)   Reaction unknown   Asa [aspirin] Other (See Comments)   Causes bleeding   Breztri Aerosphere [budeson-glycopyrrol-formoterol]    Tape Other (See Comments)   Skin tearing, causes scars Other Reaction(s): Other (See Comments)   Niacin Rash   Reglan [metoclopramide] Anxiety        Medication List     STOP taking these medications    doxycycline 100 MG capsule Commonly known as: VIBRAMYCIN   ondansetron 4 MG tablet Commonly known as: ZOFRAN   solifenacin 10 MG tablet Commonly known as: VESICARE       TAKE these medications    acetaminophen 325 MG tablet Commonly known as: TYLENOL Take 2 tablets (650 mg total) by mouth every 6 (six) hours as needed for mild pain, moderate pain or fever.    albuterol 108 (90 Base) MCG/ACT inhaler Commonly known as: VENTOLIN HFA Inhale 2 puffs into the lungs every 4 (four) hours as needed for wheezing.   apixaban 5 MG Tabs tablet Commonly known as: ELIQUIS Take 1 tablet (5 mg total) by mouth 2 (two) times daily.   atorvastatin 80 MG tablet Commonly known as: LIPITOR Take 1 tablet (80 mg total) by mouth daily at 6 PM. What changed: when to take this   bisoprolol 5 MG tablet Commonly known as: ZEBETA Take 1 tablet (5 mg total) by mouth daily.   budesonide-formoterol 80-4.5 MCG/ACT inhaler Commonly known as: Symbicort Take 2 puffs first thing in am and then another 2 puffs about 12 hours later.   Carboxymethylcellulose Sod PF 0.5 % Soln Place 2 drops into both eyes daily as needed (for dry eye relief).   cyanocobalamin 1000 MCG tablet Take 1 tablet (1,000 mcg total) by mouth daily. What changed: how much to take   diltiazem 120 MG 24 hr capsule Commonly known as: CARDIZEM CD Take 1 capsule (120 mg total) by mouth daily. Start taking on: Oct 18, 2022 What changed:  medication strength how much to take   esomeprazole 40 MG capsule Commonly known as: NexIUM Take 1 capsule (40 mg total) by mouth 2 (two) times daily before a meal.   FreeStyle Libre 2 Sensor Misc Use to check glucose as directed. Change sensor every 14 days.   furosemide 40 MG tablet Commonly known  as: Lasix Take 1 tablet (40 mg total) by mouth 2 (two) times daily.   gabapentin 400 MG capsule Commonly known as: NEURONTIN Take 1 capsule (400 mg total) by mouth 3 (three) times daily.   Gemtesa 75 MG Tabs Generic drug: Vibegron Take 1 tablet (75 mg total) by mouth daily.   glipiZIDE 5 MG 24 hr tablet Commonly known as: GLUCOTROL XL Take 1 tablet (5 mg total) by mouth daily with breakfast.   Insulin Lispro Prot & Lispro (75-25) 100 UNIT/ML Kwikpen Commonly known as: HumaLOG Mix 75/25 KwikPen Inject 40-50 Units into the skin 2 (two) times daily before a  meal.   ipratropium-albuterol 0.5-2.5 (3) MG/3ML Soln Commonly known as: DUONEB Take 3 mLs by nebulization every 6 (six) hours as needed (copd).   levothyroxine 175 MCG tablet Commonly known as: SYNTHROID Take 1 tablet (175 mcg total) by mouth every morning.   loratadine 10 MG tablet Commonly known as: CLARITIN Take 10 mg by mouth daily.   nitroGLYCERIN 0.4 MG SL tablet Commonly known as: NITROSTAT Place 1 tablet (0.4 mg total) under the tongue every 5 (five) minutes as needed for chest pain.   predniSONE 10 MG tablet Commonly known as: DELTASONE Take 1 tablet (10 mg total) by mouth daily with breakfast.   Salonpas Pain Relief Patch Ptch Apply 1 patch topically daily as needed (pain).   Trelegy Ellipta 100-62.5-25 MCG/ACT Aepb Generic drug: Fluticasone-Umeclidin-Vilant Take 1 puff by mouth daily.   Vitamin D3 125 MCG (5000 UT) Caps Take 1 capsule (5,000 Units total) by mouth daily.        Follow-up Information     Del Newman Nip, Tenna Child, [place]. Schedule an appointment as soon as possible for a visit in 10 day(s).   Specialty: Family Medicine Contact information: [address] [place] [address] [phone]                Discharge Exam: Ceasar Mons Weights   10/17/22 0015  Weight: 126.9 kg   General exam: Alert, awake, oriented x 3; no chest pain, no nausea, no vomiting.  Negative orthostatic vital signs appreciated and patient denying lightheadedness or dizziness at time of discharge. Respiratory system: Clear to auscultation. Respiratory effort normal.  Good saturation on chronic supplementation. Cardiovascular system: Rate controlled; no rubs or gallops. Gastrointestinal system: Abdomen is obese, nondistended, soft and nontender. No organomegaly or masses felt. Normal bowel sounds heard. Central nervous system: Alert and oriented. No focal neurological deficits. Extremities: No cyanosis or clubbing; trace edema appreciated bilaterally. Skin: No  petechiae. Psychiatry: Judgement and insight appear normal. Mood & affect appropriate.    Condition at discharge: Stable and improved.  The results of significant diagnostics from this hospitalization (including imaging, microbiology, ancillary and laboratory) are listed below for reference.   Imaging Studies: ECHOCARDIOGRAM COMPLETE  Result Date: 09/18/2022    ECHOCARDIOGRAM REPORT   Patient Name:   Ann [name] Date of Exam: 09/18/2022 Medical Rec #:  [id number]        Height:       63.0 in Accession #:    [id number]       Weight:       275.6 lb Date of Birth:  12-Sep-1945         BSA:          2.216 m Patient Age:    77 years         BP:           109/80 mmHg  Patient Gender: F                HR:           96 bpm. [facility] Procedure: 2D Echo, Cardiac Doppler and Color Doppler Indications:    CHF-Acute Diastolic I50.31  History:        Patient has prior history of Echocardiogram examinations, most                 recent 01/24/2020. CAD, COPD, Arrythmias:Atrial Fibrillation;                 Risk Factors:Diabetes, Dyslipidemia, Former Smoker, Hypertension                 and Sleep Apnea.  Sonographer:    Aron Baba Referring Phys: [id number] OLADAPO ADEFESO  Sonographer Comments: Image acquisition challenging due to COPD, Image acquisition challenging due to patient body habitus and Image acquisition challenging due to respiratory motion. IMPRESSIONS  1. Left ventricular ejection fraction, by estimation, is 60 to 65%. The left ventricle has normal function. The left ventricle has no regional wall motion abnormalities. There is mild left ventricular hypertrophy. Left ventricular diastolic function could not be evaluated. The E/e' is 25.  2. Right ventricular systolic function is normal. The right ventricular size is normal. Tricuspid regurgitation signal is inadequate for assessing PA pressure.  3. Left atrial size was moderately dilated.  4. The mitral valve is normal in structure. No  evidence of mitral valve regurgitation. No evidence of mitral stenosis.  5. The aortic valve has an indeterminant number of cusps. Aortic valve regurgitation is not visualized. No aortic stenosis is present.  6. Aortic dilatation noted. There is borderline dilatation of the aortic root, measuring 38 mm. There is mild dilatation of the ascending aorta, measuring 44 mm.  7. The inferior vena cava is dilated in size with >50% respiratory variability, suggesting right atrial pressure of 8 mmHg. Comparison(s): No significant change from prior study. FINDINGS  Left Ventricle: Left ventricular ejection fraction, by estimation, is 60 to 65%. The left ventricle has normal function. The left ventricle has no regional wall motion abnormalities. The left ventricular internal cavity size was normal in size. There is  mild left ventricular hypertrophy. Left ventricular diastolic function could not be evaluated due to atrial fibrillation. Left ventricular diastolic function could not be evaluated. The E/e' is 25. Right Ventricle: The right ventricular size is normal. No increase in right ventricular wall thickness. Right ventricular systolic function is normal. Tricuspid regurgitation signal is inadequate for assessing PA pressure. Left Atrium: Left atrial size was moderately dilated. Right Atrium: Right atrial size was normal in size. Pericardium: Trivial pericardial effusion is present. Mitral Valve: The mitral valve is normal in structure. No evidence of mitral valve regurgitation. No evidence of mitral valve stenosis. Tricuspid Valve: The tricuspid valve is normal in structure. Tricuspid valve regurgitation is not demonstrated. No evidence of tricuspid stenosis. Aortic Valve: The aortic valve has an indeterminant number of cusps. Aortic valve regurgitation is not visualized. No aortic stenosis is present. Aortic valve mean gradient measures 2.0 mmHg. Aortic valve peak gradient measures 3.1 mmHg. Aortic valve area, by VTI  measures 1.66 cm. Pulmonic Valve: The pulmonic valve was not well visualized. Pulmonic valve regurgitation is not visualized. No evidence of pulmonic stenosis. Aorta: Aortic dilatation noted. There is borderline dilatation of the aortic root, measuring 38 mm. There is mild dilatation of the ascending aorta, measuring 44 mm. Venous: The  inferior vena cava is dilated in size with greater than 50% respiratory variability, suggesting right atrial pressure of 8 mmHg. IAS/Shunts: No atrial level shunt detected by color flow Doppler.  LEFT VENTRICLE PLAX 2D LVIDd:         5.00 cm   Diastology LVIDs:         3.40 cm   LV e' medial:    4.13 cm/s LV PW:         1.40 cm   LV E/e' medial:  25.2 LV IVS:        1.10 cm   LV e' lateral:   9.14 cm/s LVOT diam:     1.60 cm   LV E/e' lateral: 11.4 LV SV:         27 LV SV Index:   12 LVOT Area:     2.01 cm  RIGHT VENTRICLE RV S prime:     8.87 cm/s TAPSE (M-mode): 2.0 cm LEFT ATRIUM           Index        RIGHT ATRIUM           Index LA diam:      3.00 cm 1.35 cm/m   RA Area:     18.30 cm LA Vol (A2C): 22.6 ml 10.20 ml/m  RA Volume:   49.10 ml  22.16 ml/m LA Vol (A4C): 96.3 ml 43.46 ml/m  AORTIC VALVE AV Area (Vmax):    1.89 cm AV Area (Vmean):   1.78 cm AV Area (VTI):     1.66 cm AV Vmax:           87.70 cm/s AV Vmean:          63.300 cm/s AV VTI:            0.164 m AV Peak Grad:      3.1 mmHg AV Mean Grad:      2.0 mmHg LVOT Vmax:         82.53 cm/s LVOT Vmean:        55.900 cm/s LVOT VTI:          0.136 m LVOT/AV VTI ratio: 0.83  AORTA Ao Root diam: 3.80 cm Ao Asc diam:  4.40 cm MITRAL VALVE MV Area (PHT): 4.63 cm     SHUNTS MV Decel Time: 164 msec     Systemic VTI:  0.14 m MV E velocity: 104.00 cm/s  Systemic Diam: 1.60 cm MV A velocity: 38.90 cm/s MV E/A ratio:  2.67 Vishnu Priya Mallipeddi Electronically signed by Winfield Rast Mallipeddi Signature Date/Time: 09/18/2022/3:51:31 PM    Final     Microbiology: Results for orders placed or performed in visit on 08/19/22   Microscopic Examination     Status: Abnormal   Collection Time: 08/19/22  2:13 PM   Urine  Result Value Ref Range Status   WBC, UA >30 (A) 0 - 5 /hpf Final   RBC, Urine 3-10 (A) 0 - 2 /hpf Final   Epithelial Cells (non renal) 0-10 0 - 10 /hpf Final   Bacteria, UA Many (A) None seen/Few Final  Urine Culture     Status: Abnormal   Collection Time: 08/19/22  3:13 PM   Specimen: Urine   UR  Result Value Ref Range Status   Urine Culture, Routine Final report (A)  Final   Organism ID, Bacteria Escherichia coli (A)  Final    Comment: Multi-Drug Resistant Organism Susceptibility profile is consistent with a probable ESBL. Greater than 100,000  colony forming units per mL    Antimicrobial Susceptibility Comment  Final    Comment:       ** S = Susceptible; I = Intermediate; R = Resistant **                    P = Positive; N = Negative             MICS are expressed in micrograms per mL    Antibiotic                 RSLT#1    RSLT#2    RSLT#3    RSLT#4 Amoxicillin/Clavulanic Acid    R Ampicillin                     R Cefazolin                      R Cefepime                       S Ceftriaxone                    R Cefuroxime                     R Ciprofloxacin                  S Ertapenem                      S Gentamicin                     S Imipenem                       S Levofloxacin                   S Meropenem                      S Nitrofurantoin                 S Piperacillin/Tazobactam        R Tetracycline                   R Tobramycin                     S Trimethoprim/Sulfa             R     Labs: CBC: Recent Labs  Lab 10/16/22 1533 10/16/22 2137 10/17/22 0516  WBC 13.4* 10.2 8.7  NEUTROABS 10.6* 7.3 5.6  HGB 9.0* 9.0* 8.3*  HCT 31.6* 32.2* 29.7*  MCV 73* 75.8* 76.2*  PLT 236 250 211   Basic Metabolic Panel: Recent Labs  Lab 10/14/22 1042 10/16/22 1533 10/16/22 2137 10/17/22 0516  NA 143 139 137 138  K 4.5 4.6 3.8 3.6  CL 103 97 96* 100  CO2  24 21 30 29   GLUCOSE 179* 161* 146* 198*  BUN 16 22 22 21   CREATININE 0.96 1.17* 1.00 1.02*  CALCIUM 9.4 9.2 8.9 9.1  MG  --   --   --  1.6*   Liver Function Tests: Recent Labs  Lab 10/16/22 2137 10/17/22 0516  AST 16 15  ALT 17 14  ALKPHOS 80 70  BILITOT 0.6 0.5  PROT 7.4 6.6  ALBUMIN 3.5 3.0*   CBG: Recent Labs  Lab 10/17/22 0018 10/17/22 0440 10/17/22 0704 10/17/22 1129  GLUCAP 142* 226* 171* 216*    Discharge time spent: less than 30 minutes.  Signed: Vassie Loll, MD [facility] 10/17/2022

## 2022-10-17 NOTE — Assessment & Plan Note (Addendum)
-   Continue statin, beta-blocker - No clinical signs of acute exacerbation at this time -Continue outpatient follow-up with cardiology service -Heart healthy diet discussed with patient.

## 2022-10-17 NOTE — Assessment & Plan Note (Addendum)
-   Continue diltiazem at reduced dose and the use of bisoprolol. -Continue Eliquis for secondary prevention.
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
## 2022-10-18 ENCOUNTER — Encounter: Payer: Self-pay | Admitting: Nurse Practitioner

## 2022-10-18 ENCOUNTER — Ambulatory Visit: Payer: Medicare HMO | Attending: Nurse Practitioner | Admitting: Nurse Practitioner

## 2022-10-18 VITALS — BP 130/51 | HR 75 | Ht 63.0 in | Wt 277.6 lb

## 2022-10-18 DIAGNOSIS — I10 Essential (primary) hypertension: Secondary | ICD-10-CM | POA: Diagnosis not present

## 2022-10-18 DIAGNOSIS — Z79899 Other long term (current) drug therapy: Secondary | ICD-10-CM

## 2022-10-18 DIAGNOSIS — R600 Localized edema: Secondary | ICD-10-CM

## 2022-10-18 DIAGNOSIS — I2510 Atherosclerotic heart disease of native coronary artery without angina pectoris: Secondary | ICD-10-CM | POA: Diagnosis not present

## 2022-10-18 DIAGNOSIS — E785 Hyperlipidemia, unspecified: Secondary | ICD-10-CM

## 2022-10-18 DIAGNOSIS — J449 Chronic obstructive pulmonary disease, unspecified: Secondary | ICD-10-CM

## 2022-10-18 DIAGNOSIS — I5032 Chronic diastolic (congestive) heart failure: Secondary | ICD-10-CM | POA: Diagnosis not present

## 2022-10-18 DIAGNOSIS — R42 Dizziness and giddiness: Secondary | ICD-10-CM

## 2022-10-18 DIAGNOSIS — I480 Paroxysmal atrial fibrillation: Secondary | ICD-10-CM

## 2022-10-18 DIAGNOSIS — D509 Iron deficiency anemia, unspecified: Secondary | ICD-10-CM | POA: Diagnosis not present

## 2022-10-18 DIAGNOSIS — L0390 Cellulitis, unspecified: Secondary | ICD-10-CM | POA: Diagnosis not present

## 2022-10-18 LAB — IRON,TIBC AND FERRITIN PANEL
Ferritin: 10 ng/mL — ABNORMAL LOW (ref 15–150)
Iron Saturation: 6 % — CL (ref 15–55)
Iron: 25 ug/dL — ABNORMAL LOW (ref 27–139)

## 2022-10-18 LAB — MICROALBUMIN / CREATININE URINE RATIO
Creatinine, Urine: 21.2 mg/dL
Microalb/Creat Ratio: <14 mg/g creat (ref 0–29)

## 2022-10-18 LAB — CBC WITH DIFFERENTIAL/PLATELET
Basos: 0 %
EOS (ABSOLUTE): 0.1 10*3/uL (ref 0.0–0.4)
MCH: 20.7 pg — ABNORMAL LOW (ref 26.6–33.0)
Monocytes Absolute: 0.8 10*3/uL (ref 0.1–0.9)
Neutrophils Absolute: 10.6 10*3/uL — ABNORMAL HIGH (ref 1.4–7.0)
Neutrophils: 80 %
RDW: 18.6 % — ABNORMAL HIGH (ref 11.7–15.4)
WBC: 13.4 10*3/uL — ABNORMAL HIGH (ref 3.4–10.8)

## 2022-10-18 LAB — BASIC METABOLIC PANEL
BUN: 22 mg/dL (ref 8–27)
Glucose: 161 mg/dL — ABNORMAL HIGH (ref 70–99)
Sodium: 139 mmol/L (ref 134–144)
eGFR: 48 mL/min/{1.73_m2} — ABNORMAL LOW (ref >59)

## 2022-10-18 LAB — LIPID PANEL
Chol/HDL Ratio: 3 ratio (ref 0.0–4.4)
HDL: 43 mg/dL (ref >39)
LDL Chol Calc (NIH): 61 mg/dL (ref 0–99)
Triglycerides: 127 mg/dL (ref 0–149)
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
## 2022-10-18 NOTE — Patient Instructions (Signed)
Medication Instructions:   Continue all current medications.   Labwork:  Follow up with pcp in regards to low iron  Testing/Procedures:  none  Follow-Up:  2-3 months - Sharlene Dory, NP Cancel visit with Dr. Wyline Mood for 2 weeks and change to 6 months   Any Other Special Instructions Will Be Listed Below (If Applicable).   If you need a refill on your cardiac medications before your next appointment, please call your pharmacy.

## 2022-10-22 ENCOUNTER — Ambulatory Visit (INDEPENDENT_AMBULATORY_CARE_PROVIDER_SITE_OTHER): Payer: Medicare HMO

## 2022-10-22 DIAGNOSIS — Z0000 Encounter for general adult medical examination without abnormal findings: Secondary | ICD-10-CM

## 2022-10-22 NOTE — Patient Instructions (Signed)
  Ann [name] , Thank you for taking time to come for your Medicare Wellness Visit. I appreciate your ongoing commitment to your health goals. Please review the following plan we discussed and let me know if I can assist you in the future.   These are the goals we discussed:  Goals      Patient Stated     None        This is a list of the screening recommended for you and due dates:  Health Maintenance  Topic Date Due   Zoster (Shingles) Vaccine (1 of 2) Never done   Pneumonia Vaccine (2 of 2 - PCV) 10/09/2022   COVID-19 Vaccine (1) 02/03/2023*   Flu Shot  12/26/2022   Eye exam for diabetics  02/26/2023   Hemoglobin A1C  03/19/2023   Yearly kidney health urinalysis for diabetes  10/16/2023   Complete foot exam   10/16/2023   Yearly kidney function blood test for diabetes  10/17/2023   Medicare Annual Wellness Visit  10/22/2023   DTaP/Tdap/Td vaccine (2 - Td or Tdap) 03/08/2024   DEXA scan (bone density measurement)  Completed   Hepatitis C Screening  Completed   HPV Vaccine  Aged Out   Colon Cancer Screening  Discontinued  *Topic was postponed. The date shown is not the original due date.
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
## 2022-10-23 DIAGNOSIS — J449 Chronic obstructive pulmonary disease, unspecified: Secondary | ICD-10-CM | POA: Diagnosis not present

## 2022-10-23 DIAGNOSIS — E1165 Type 2 diabetes mellitus with hyperglycemia: Secondary | ICD-10-CM | POA: Diagnosis not present

## 2022-10-23 DIAGNOSIS — I482 Chronic atrial fibrillation, unspecified: Secondary | ICD-10-CM | POA: Diagnosis not present

## 2022-10-23 DIAGNOSIS — L03116 Cellulitis of left lower limb: Secondary | ICD-10-CM | POA: Diagnosis not present

## 2022-10-23 DIAGNOSIS — J9611 Chronic respiratory failure with hypoxia: Secondary | ICD-10-CM | POA: Diagnosis not present

## 2022-10-23 DIAGNOSIS — I5033 Acute on chronic diastolic (congestive) heart failure: Secondary | ICD-10-CM | POA: Diagnosis not present

## 2022-10-23 DIAGNOSIS — Z7951 Long term (current) use of inhaled steroids: Secondary | ICD-10-CM | POA: Diagnosis not present

## 2022-10-23 DIAGNOSIS — Z6841 Body Mass Index (BMI) 40.0 and over, adult: Secondary | ICD-10-CM | POA: Diagnosis not present

## 2022-10-23 DIAGNOSIS — D649 Anemia, unspecified: Secondary | ICD-10-CM | POA: Diagnosis not present

## 2022-10-23 DIAGNOSIS — I110 Hypertensive heart disease with heart failure: Secondary | ICD-10-CM | POA: Diagnosis not present

## 2022-10-23 DIAGNOSIS — Z7901 Long term (current) use of anticoagulants: Secondary | ICD-10-CM | POA: Diagnosis not present

## 2022-10-23 DIAGNOSIS — E039 Hypothyroidism, unspecified: Secondary | ICD-10-CM | POA: Diagnosis not present

## 2022-10-23 DIAGNOSIS — E782 Mixed hyperlipidemia: Secondary | ICD-10-CM | POA: Diagnosis not present

## 2022-10-23 DIAGNOSIS — Z794 Long term (current) use of insulin: Secondary | ICD-10-CM | POA: Diagnosis not present

## 2022-10-24 ENCOUNTER — Other Ambulatory Visit: Payer: Self-pay | Admitting: Family Medicine

## 2022-10-24 DIAGNOSIS — I110 Hypertensive heart disease with heart failure: Secondary | ICD-10-CM | POA: Diagnosis not present

## 2022-10-24 DIAGNOSIS — Z7901 Long term (current) use of anticoagulants: Secondary | ICD-10-CM | POA: Diagnosis not present

## 2022-10-24 DIAGNOSIS — E611 Iron deficiency: Secondary | ICD-10-CM

## 2022-10-24 DIAGNOSIS — E039 Hypothyroidism, unspecified: Secondary | ICD-10-CM | POA: Diagnosis not present

## 2022-10-24 DIAGNOSIS — I482 Chronic atrial fibrillation, unspecified: Secondary | ICD-10-CM | POA: Diagnosis not present

## 2022-10-24 DIAGNOSIS — L03116 Cellulitis of left lower limb: Secondary | ICD-10-CM | POA: Diagnosis not present

## 2022-10-24 DIAGNOSIS — J449 Chronic obstructive pulmonary disease, unspecified: Secondary | ICD-10-CM | POA: Diagnosis not present

## 2022-10-24 DIAGNOSIS — E1165 Type 2 diabetes mellitus with hyperglycemia: Secondary | ICD-10-CM | POA: Diagnosis not present

## 2022-10-24 DIAGNOSIS — Z6841 Body Mass Index (BMI) 40.0 and over, adult: Secondary | ICD-10-CM | POA: Diagnosis not present

## 2022-10-24 DIAGNOSIS — E782 Mixed hyperlipidemia: Secondary | ICD-10-CM | POA: Diagnosis not present

## 2022-10-24 DIAGNOSIS — I5033 Acute on chronic diastolic (congestive) heart failure: Secondary | ICD-10-CM | POA: Diagnosis not present

## 2022-10-24 DIAGNOSIS — J9611 Chronic respiratory failure with hypoxia: Secondary | ICD-10-CM | POA: Diagnosis not present

## 2022-10-24 DIAGNOSIS — Z794 Long term (current) use of insulin: Secondary | ICD-10-CM | POA: Diagnosis not present

## 2022-10-24 DIAGNOSIS — D649 Anemia, unspecified: Secondary | ICD-10-CM | POA: Diagnosis not present

## 2022-10-24 DIAGNOSIS — Z7951 Long term (current) use of inhaled steroids: Secondary | ICD-10-CM | POA: Diagnosis not present

## 2022-10-24 MED ORDER — IRON (FERROUS SULFATE) 325 (65 FE) MG PO TABS: 325.0000 mg | ORAL_TABLET | Freq: Every day | ORAL | 3 refills | Status: DC

## 2022-10-26 DIAGNOSIS — I4891 Unspecified atrial fibrillation: Secondary | ICD-10-CM | POA: Diagnosis not present

## 2022-10-26 DIAGNOSIS — I5033 Acute on chronic diastolic (congestive) heart failure: Secondary | ICD-10-CM | POA: Diagnosis not present

## 2022-10-26 DIAGNOSIS — J9601 Acute respiratory failure with hypoxia: Secondary | ICD-10-CM | POA: Diagnosis not present

## 2022-10-29 DIAGNOSIS — D500 Iron deficiency anemia secondary to blood loss (chronic): Secondary | ICD-10-CM | POA: Insufficient documentation

## 2022-10-29 DIAGNOSIS — D509 Iron deficiency anemia, unspecified: Secondary | ICD-10-CM | POA: Insufficient documentation
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
## 2022-10-30 ENCOUNTER — Encounter: Payer: Self-pay | Admitting: Hematology

## 2022-10-30 ENCOUNTER — Inpatient Hospital Stay: Payer: Medicare HMO | Attending: Hematology | Admitting: Hematology

## 2022-10-30 VITALS — BP 141/74 | HR 72 | Temp 98.2°F | Resp 20 | Ht 63.0 in | Wt 278.0 lb

## 2022-10-30 DIAGNOSIS — Z794 Long term (current) use of insulin: Secondary | ICD-10-CM | POA: Diagnosis not present

## 2022-10-30 DIAGNOSIS — I130 Hypertensive heart and chronic kidney disease with heart failure and stage 1 through stage 4 chronic kidney disease, or unspecified chronic kidney disease: Secondary | ICD-10-CM | POA: Diagnosis not present

## 2022-10-30 DIAGNOSIS — N189 Chronic kidney disease, unspecified: Secondary | ICD-10-CM | POA: Diagnosis not present

## 2022-10-30 DIAGNOSIS — D500 Iron deficiency anemia secondary to blood loss (chronic): Secondary | ICD-10-CM

## 2022-10-30 DIAGNOSIS — Z86718 Personal history of other venous thrombosis and embolism: Secondary | ICD-10-CM | POA: Insufficient documentation

## 2022-10-30 DIAGNOSIS — Z79899 Other long term (current) drug therapy: Secondary | ICD-10-CM | POA: Insufficient documentation

## 2022-10-30 DIAGNOSIS — Z7984 Long term (current) use of oral hypoglycemic drugs: Secondary | ICD-10-CM | POA: Diagnosis not present

## 2022-10-30 DIAGNOSIS — Z7901 Long term (current) use of anticoagulants: Secondary | ICD-10-CM | POA: Insufficient documentation

## 2022-10-30 DIAGNOSIS — E1122 Type 2 diabetes mellitus with diabetic chronic kidney disease: Secondary | ICD-10-CM | POA: Insufficient documentation

## 2022-10-30 DIAGNOSIS — D509 Iron deficiency anemia, unspecified: Secondary | ICD-10-CM | POA: Diagnosis not present

## 2022-10-30 NOTE — Patient Instructions (Addendum)
[facility]  Discharge Instructions  You were seen and examined today by Dr. Ellin Saba. Dr. Ellin Saba is a hematologist, meaning that he specializes in blood abnormalities. Dr. Ellin Saba discussed your past medical history, family history of cancers/blood conditions and the events that led to you being here today.  You were referred to Dr. Ellin Saba due to anemia. Your iron is low. You should have iron infusions, those will be arranged and given here in the Cancer Center.  Follow-up as scheduled.  Thank you for choosing [facility] to provide your oncology and hematology care.   To afford each patient quality time with our provider, please arrive at least 15 minutes before your scheduled appointment time. You may need to reschedule your appointment if you arrive late (10 or more minutes). Arriving late affects you and other patients whose appointments are after yours.  Also, if you miss three or more appointments without notifying the office, you may be dismissed from the clinic at the provider's discretion.    Again, thank you for choosing [facility].  Our hope is that these requests will decrease the amount of time that you wait before being seen by our physicians.   If you have a lab appointment with the Cancer Center - please note that after April 8th, all labs will be drawn in the cancer center.  You do not have to check in or register with the main entrance as you have in the past but will complete your check-in at the cancer center.            _____________________________________________________________  Should you have questions after your visit to [facility] please contact our office at [phone] and follow the prompts.  Our office hours are 8:00 a.m. to 4:30 p.m. Monday - Thursday and 8:00 a.m. to 2:30 p.m. Friday.  Please note that voicemails left after 4:00 p.m. may not be returned until the  following business day.  We are closed weekends and all major holidays.  You do have access to a nurse 24-7, just call the main number to the clinic [phone] and do not press any options, hold on the line and a nurse will answer the phone.    For prescription refill requests, have your pharmacy contact our office and allow 72 hours.    Masks are no longer required in the cancer centers. If you would like for your care team to wear a mask while they are taking care of you, please let them know. You may have one support person who is at least 77 years old accompany you for your appointments.

## 2022-10-31 ENCOUNTER — Ambulatory Visit: Payer: Medicaid - Dental | Admitting: Cardiology

## 2022-10-31 DIAGNOSIS — J449 Chronic obstructive pulmonary disease, unspecified: Secondary | ICD-10-CM | POA: Diagnosis not present

## 2022-10-31 DIAGNOSIS — D649 Anemia, unspecified: Secondary | ICD-10-CM | POA: Diagnosis not present

## 2022-10-31 DIAGNOSIS — Z6841 Body Mass Index (BMI) 40.0 and over, adult: Secondary | ICD-10-CM | POA: Diagnosis not present

## 2022-10-31 DIAGNOSIS — I482 Chronic atrial fibrillation, unspecified: Secondary | ICD-10-CM | POA: Diagnosis not present

## 2022-10-31 DIAGNOSIS — J9611 Chronic respiratory failure with hypoxia: Secondary | ICD-10-CM | POA: Diagnosis not present

## 2022-10-31 DIAGNOSIS — E1165 Type 2 diabetes mellitus with hyperglycemia: Secondary | ICD-10-CM | POA: Diagnosis not present

## 2022-10-31 DIAGNOSIS — E782 Mixed hyperlipidemia: Secondary | ICD-10-CM | POA: Diagnosis not present

## 2022-10-31 DIAGNOSIS — Z794 Long term (current) use of insulin: Secondary | ICD-10-CM | POA: Diagnosis not present

## 2022-10-31 DIAGNOSIS — L03116 Cellulitis of left lower limb: Secondary | ICD-10-CM | POA: Diagnosis not present

## 2022-10-31 DIAGNOSIS — I5033 Acute on chronic diastolic (congestive) heart failure: Secondary | ICD-10-CM | POA: Diagnosis not present

## 2022-10-31 DIAGNOSIS — I110 Hypertensive heart disease with heart failure: Secondary | ICD-10-CM | POA: Diagnosis not present

## 2022-10-31 DIAGNOSIS — E039 Hypothyroidism, unspecified: Secondary | ICD-10-CM | POA: Diagnosis not present

## 2022-10-31 DIAGNOSIS — Z7951 Long term (current) use of inhaled steroids: Secondary | ICD-10-CM | POA: Diagnosis not present

## 2022-10-31 DIAGNOSIS — Z7901 Long term (current) use of anticoagulants: Secondary | ICD-10-CM | POA: Diagnosis not present

## 2022-11-01 ENCOUNTER — Ambulatory Visit: Payer: Medicare HMO | Admitting: Infectious Diseases

## 2022-11-01 DIAGNOSIS — I509 Heart failure, unspecified: Secondary | ICD-10-CM | POA: Diagnosis not present

## 2022-11-01 DIAGNOSIS — E1165 Type 2 diabetes mellitus with hyperglycemia: Secondary | ICD-10-CM | POA: Diagnosis not present

## 2022-11-04 ENCOUNTER — Inpatient Hospital Stay: Payer: Medicare HMO

## 2022-11-04 VITALS — BP 134/80 | HR 79 | Temp 97.0°F | Resp 18

## 2022-11-04 DIAGNOSIS — Z794 Long term (current) use of insulin: Secondary | ICD-10-CM | POA: Diagnosis not present

## 2022-11-04 DIAGNOSIS — Z7901 Long term (current) use of anticoagulants: Secondary | ICD-10-CM | POA: Diagnosis not present

## 2022-11-04 DIAGNOSIS — N189 Chronic kidney disease, unspecified: Secondary | ICD-10-CM | POA: Diagnosis not present

## 2022-11-04 DIAGNOSIS — D509 Iron deficiency anemia, unspecified: Secondary | ICD-10-CM | POA: Diagnosis not present

## 2022-11-04 DIAGNOSIS — E1122 Type 2 diabetes mellitus with diabetic chronic kidney disease: Secondary | ICD-10-CM | POA: Diagnosis not present

## 2022-11-04 DIAGNOSIS — Z79899 Other long term (current) drug therapy: Secondary | ICD-10-CM | POA: Diagnosis not present

## 2022-11-04 DIAGNOSIS — Z86718 Personal history of other venous thrombosis and embolism: Secondary | ICD-10-CM | POA: Diagnosis not present

## 2022-11-04 DIAGNOSIS — Z7984 Long term (current) use of oral hypoglycemic drugs: Secondary | ICD-10-CM | POA: Diagnosis not present

## 2022-11-04 DIAGNOSIS — I130 Hypertensive heart and chronic kidney disease with heart failure and stage 1 through stage 4 chronic kidney disease, or unspecified chronic kidney disease: Secondary | ICD-10-CM | POA: Diagnosis not present

## 2022-11-04 DIAGNOSIS — D500 Iron deficiency anemia secondary to blood loss (chronic): Secondary | ICD-10-CM

## 2022-11-04 MED ORDER — FAMOTIDINE IN NACL 20-0.9 MG/50ML-% IV SOLN
20.0000 mg | Freq: Once | INTRAVENOUS | Status: AC
  Administered 2022-11-04: 20 mg via INTRAVENOUS
  Filled 2022-11-04: qty 50

## 2022-11-04 MED ORDER — SODIUM CHLORIDE 0.9 % IV SOLN: Freq: Once | INTRAVENOUS | Status: AC

## 2022-11-04 MED ORDER — SODIUM CHLORIDE 0.9 % IV SOLN
50.0000 mg | Freq: Once | INTRAVENOUS | Status: AC
  Administered 2022-11-04: 50 mg via INTRAVENOUS
  Filled 2022-11-04: qty 1

## 2022-11-04 MED ORDER — ACETAMINOPHEN 325 MG PO TABS
650.0000 mg | ORAL_TABLET | Freq: Once | ORAL | Status: AC
  Administered 2022-11-04: 650 mg via ORAL
  Filled 2022-11-04: qty 2

## 2022-11-04 MED ORDER — METHYLPREDNISOLONE SODIUM SUCC 125 MG IJ SOLR
125.0000 mg | Freq: Once | INTRAMUSCULAR | Status: AC
  Administered 2022-11-04: 125 mg via INTRAVENOUS
  Filled 2022-11-04: qty 2

## 2022-11-04 MED ORDER — CETIRIZINE HCL 10 MG/ML IV SOLN
10.0000 mg | Freq: Once | INTRAVENOUS | Status: AC
  Administered 2022-11-04: 10 mg via INTRAVENOUS
  Filled 2022-11-04: qty 1

## 2022-11-04 MED ORDER — SODIUM CHLORIDE 0.9 % IV SOLN
950.0000 mg | Freq: Once | INTRAVENOUS | Status: AC
  Administered 2022-11-04: 950 mg via INTRAVENOUS
  Filled 2022-11-04: qty 19

## 2022-11-06 ENCOUNTER — Ambulatory Visit: Payer: Medicare HMO | Admitting: Internal Medicine

## 2022-11-06 DIAGNOSIS — J9611 Chronic respiratory failure with hypoxia: Secondary | ICD-10-CM | POA: Diagnosis not present

## 2022-11-06 DIAGNOSIS — Z6841 Body Mass Index (BMI) 40.0 and over, adult: Secondary | ICD-10-CM | POA: Diagnosis not present

## 2022-11-06 DIAGNOSIS — Z7951 Long term (current) use of inhaled steroids: Secondary | ICD-10-CM | POA: Diagnosis not present

## 2022-11-06 DIAGNOSIS — E782 Mixed hyperlipidemia: Secondary | ICD-10-CM | POA: Diagnosis not present

## 2022-11-06 DIAGNOSIS — E1165 Type 2 diabetes mellitus with hyperglycemia: Secondary | ICD-10-CM | POA: Diagnosis not present

## 2022-11-06 DIAGNOSIS — I482 Chronic atrial fibrillation, unspecified: Secondary | ICD-10-CM | POA: Diagnosis not present

## 2022-11-06 DIAGNOSIS — L03116 Cellulitis of left lower limb: Secondary | ICD-10-CM | POA: Diagnosis not present

## 2022-11-06 DIAGNOSIS — I110 Hypertensive heart disease with heart failure: Secondary | ICD-10-CM | POA: Diagnosis not present

## 2022-11-06 DIAGNOSIS — E039 Hypothyroidism, unspecified: Secondary | ICD-10-CM | POA: Diagnosis not present

## 2022-11-06 DIAGNOSIS — I5033 Acute on chronic diastolic (congestive) heart failure: Secondary | ICD-10-CM | POA: Diagnosis not present

## 2022-11-06 DIAGNOSIS — J449 Chronic obstructive pulmonary disease, unspecified: Secondary | ICD-10-CM | POA: Diagnosis not present

## 2022-11-06 DIAGNOSIS — D649 Anemia, unspecified: Secondary | ICD-10-CM | POA: Diagnosis not present

## 2022-11-06 DIAGNOSIS — Z794 Long term (current) use of insulin: Secondary | ICD-10-CM | POA: Diagnosis not present

## 2022-11-06 DIAGNOSIS — Z7901 Long term (current) use of anticoagulants: Secondary | ICD-10-CM | POA: Diagnosis not present

## 2022-11-07 ENCOUNTER — Encounter: Payer: Self-pay | Admitting: Internal Medicine

## 2022-11-07 ENCOUNTER — Ambulatory Visit (INDEPENDENT_AMBULATORY_CARE_PROVIDER_SITE_OTHER): Payer: Medicare HMO | Admitting: Internal Medicine

## 2022-11-07 ENCOUNTER — Other Ambulatory Visit: Payer: Self-pay

## 2022-11-07 VITALS — BP 132/78 | HR 78 | Resp 16 | Ht 63.0 in | Wt 278.0 lb

## 2022-11-07 DIAGNOSIS — I872 Venous insufficiency (chronic) (peripheral): Secondary | ICD-10-CM | POA: Diagnosis not present

## 2022-11-07 DIAGNOSIS — I509 Heart failure, unspecified: Secondary | ICD-10-CM

## 2022-11-07 MED ORDER — TRIAMCINOLONE ACETONIDE 0.1 % EX CREA: 1.0000 | TOPICAL_CREAM | Freq: Two times a day (BID) | CUTANEOUS | 3 refills | Status: AC
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
## 2022-11-07 NOTE — Patient Instructions (Addendum)
Use kenalog cream twice a day for the next 2 weeks   Wear compression stocking to prevent leg swelling   What you have Is venous stasis dermatitis, not cellulitis    If you have rapidly spreading redness and fever, chill, please call my clinic for evaluation   No need to follow up with [place]    Ask your doctor primary care provider to get an ABI study to evaluate blood flow in your legs

## 2022-11-21 ENCOUNTER — Telehealth: Payer: Self-pay | Admitting: Family Medicine

## 2022-11-21 DIAGNOSIS — B351 Tinea unguium: Secondary | ICD-10-CM | POA: Diagnosis not present

## 2022-11-21 DIAGNOSIS — E1142 Type 2 diabetes mellitus with diabetic polyneuropathy: Secondary | ICD-10-CM | POA: Diagnosis not present

## 2022-11-21 NOTE — Telephone Encounter (Signed)
Pt needs new mattress for her hospital bed   Would like orders sent in to [facility]

## 2022-11-22 NOTE — Telephone Encounter (Signed)
Rx sent for mattress

## 2022-11-25 DIAGNOSIS — I4891 Unspecified atrial fibrillation: Secondary | ICD-10-CM | POA: Diagnosis not present

## 2022-11-25 DIAGNOSIS — I5033 Acute on chronic diastolic (congestive) heart failure: Secondary | ICD-10-CM | POA: Diagnosis not present

## 2022-11-25 DIAGNOSIS — J9601 Acute respiratory failure with hypoxia: Secondary | ICD-10-CM | POA: Diagnosis not present

## 2022-12-03 ENCOUNTER — Ambulatory Visit: Payer: Medicare HMO | Admitting: "Endocrinology

## 2022-12-03 ENCOUNTER — Emergency Department (HOSPITAL_COMMUNITY): Payer: Medicare HMO

## 2022-12-03 ENCOUNTER — Inpatient Hospital Stay (HOSPITAL_COMMUNITY): Payer: Medicare HMO

## 2022-12-03 ENCOUNTER — Encounter (HOSPITAL_COMMUNITY): Payer: Self-pay

## 2022-12-03 ENCOUNTER — Other Ambulatory Visit: Payer: Self-pay

## 2022-12-03 ENCOUNTER — Inpatient Hospital Stay (HOSPITAL_COMMUNITY)
Admission: EM | Admit: 2022-12-03 | Discharge: 2022-12-06 | DRG: 291 | Disposition: A | Discharge status: Home / Self Care | Payer: Medicare HMO | Attending: Internal Medicine | Admitting: Internal Medicine

## 2022-12-03 DIAGNOSIS — E662 Morbid (severe) obesity with alveolar hypoventilation: Secondary | ICD-10-CM | POA: Diagnosis not present

## 2022-12-03 DIAGNOSIS — I4821 Permanent atrial fibrillation: Secondary | ICD-10-CM | POA: Diagnosis present

## 2022-12-03 DIAGNOSIS — K219 Gastro-esophageal reflux disease without esophagitis: Secondary | ICD-10-CM | POA: Diagnosis present

## 2022-12-03 DIAGNOSIS — Z886 Allergy status to analgesic agent status: Secondary | ICD-10-CM

## 2022-12-03 DIAGNOSIS — J9690 Respiratory failure, unspecified, unspecified whether with hypoxia or hypercapnia: Secondary | ICD-10-CM

## 2022-12-03 DIAGNOSIS — I959 Hypotension, unspecified: Secondary | ICD-10-CM | POA: Diagnosis present

## 2022-12-03 DIAGNOSIS — R0600 Dyspnea, unspecified: Principal | ICD-10-CM

## 2022-12-03 DIAGNOSIS — R918 Other nonspecific abnormal finding of lung field: Secondary | ICD-10-CM | POA: Diagnosis not present

## 2022-12-03 DIAGNOSIS — E039 Hypothyroidism, unspecified: Secondary | ICD-10-CM | POA: Diagnosis present

## 2022-12-03 DIAGNOSIS — I5033 Acute on chronic diastolic (congestive) heart failure: Secondary | ICD-10-CM | POA: Diagnosis present

## 2022-12-03 DIAGNOSIS — N183 Chronic kidney disease, stage 3 unspecified: Secondary | ICD-10-CM | POA: Diagnosis present

## 2022-12-03 DIAGNOSIS — E1122 Type 2 diabetes mellitus with diabetic chronic kidney disease: Secondary | ICD-10-CM | POA: Diagnosis present

## 2022-12-03 DIAGNOSIS — F419 Anxiety disorder, unspecified: Secondary | ICD-10-CM | POA: Diagnosis present

## 2022-12-03 DIAGNOSIS — Z7901 Long term (current) use of anticoagulants: Secondary | ICD-10-CM

## 2022-12-03 DIAGNOSIS — I517 Cardiomegaly: Secondary | ICD-10-CM | POA: Diagnosis not present

## 2022-12-03 DIAGNOSIS — I2781 Cor pulmonale (chronic): Secondary | ICD-10-CM | POA: Diagnosis not present

## 2022-12-03 DIAGNOSIS — Z888 Allergy status to other drugs, medicaments and biological substances status: Secondary | ICD-10-CM

## 2022-12-03 DIAGNOSIS — D509 Iron deficiency anemia, unspecified: Secondary | ICD-10-CM | POA: Diagnosis present

## 2022-12-03 DIAGNOSIS — Z981 Arthrodesis status: Secondary | ICD-10-CM

## 2022-12-03 DIAGNOSIS — R0689 Other abnormalities of breathing: Secondary | ICD-10-CM | POA: Diagnosis not present

## 2022-12-03 DIAGNOSIS — Z743 Need for continuous supervision: Secondary | ICD-10-CM | POA: Diagnosis not present

## 2022-12-03 DIAGNOSIS — I130 Hypertensive heart and chronic kidney disease with heart failure and stage 1 through stage 4 chronic kidney disease, or unspecified chronic kidney disease: Secondary | ICD-10-CM | POA: Diagnosis not present

## 2022-12-03 DIAGNOSIS — I10 Essential (primary) hypertension: Secondary | ICD-10-CM | POA: Diagnosis present

## 2022-12-03 DIAGNOSIS — J441 Chronic obstructive pulmonary disease with (acute) exacerbation: Secondary | ICD-10-CM | POA: Diagnosis not present

## 2022-12-03 DIAGNOSIS — E782 Mixed hyperlipidemia: Secondary | ICD-10-CM | POA: Diagnosis present

## 2022-12-03 DIAGNOSIS — Z79890 Hormone replacement therapy (postmenopausal): Secondary | ICD-10-CM

## 2022-12-03 DIAGNOSIS — E1169 Type 2 diabetes mellitus with other specified complication: Secondary | ICD-10-CM | POA: Diagnosis present

## 2022-12-03 DIAGNOSIS — Z8249 Family history of ischemic heart disease and other diseases of the circulatory system: Secondary | ICD-10-CM

## 2022-12-03 DIAGNOSIS — I5032 Chronic diastolic (congestive) heart failure: Secondary | ICD-10-CM | POA: Diagnosis present

## 2022-12-03 DIAGNOSIS — Z7952 Long term (current) use of systemic steroids: Secondary | ICD-10-CM

## 2022-12-03 DIAGNOSIS — Z794 Long term (current) use of insulin: Secondary | ICD-10-CM

## 2022-12-03 DIAGNOSIS — I2510 Atherosclerotic heart disease of native coronary artery without angina pectoris: Secondary | ICD-10-CM | POA: Diagnosis present

## 2022-12-03 DIAGNOSIS — R531 Weakness: Secondary | ICD-10-CM

## 2022-12-03 DIAGNOSIS — I5030 Unspecified diastolic (congestive) heart failure: Secondary | ICD-10-CM | POA: Diagnosis not present

## 2022-12-03 DIAGNOSIS — E1165 Type 2 diabetes mellitus with hyperglycemia: Secondary | ICD-10-CM | POA: Diagnosis present

## 2022-12-03 DIAGNOSIS — I4819 Other persistent atrial fibrillation: Secondary | ICD-10-CM

## 2022-12-03 DIAGNOSIS — I5031 Acute diastolic (congestive) heart failure: Secondary | ICD-10-CM

## 2022-12-03 DIAGNOSIS — Z7951 Long term (current) use of inhaled steroids: Secondary | ICD-10-CM

## 2022-12-03 DIAGNOSIS — Z7984 Long term (current) use of oral hypoglycemic drugs: Secondary | ICD-10-CM

## 2022-12-03 DIAGNOSIS — I482 Chronic atrial fibrillation, unspecified: Secondary | ICD-10-CM

## 2022-12-03 DIAGNOSIS — Z79899 Other long term (current) drug therapy: Secondary | ICD-10-CM

## 2022-12-03 DIAGNOSIS — I700 Atherosclerosis of aorta: Secondary | ICD-10-CM | POA: Diagnosis not present

## 2022-12-03 DIAGNOSIS — E1142 Type 2 diabetes mellitus with diabetic polyneuropathy: Secondary | ICD-10-CM | POA: Diagnosis present

## 2022-12-03 DIAGNOSIS — Z955 Presence of coronary angioplasty implant and graft: Secondary | ICD-10-CM

## 2022-12-03 DIAGNOSIS — R0989 Other specified symptoms and signs involving the circulatory and respiratory systems: Secondary | ICD-10-CM | POA: Diagnosis not present

## 2022-12-03 DIAGNOSIS — J449 Chronic obstructive pulmonary disease, unspecified: Secondary | ICD-10-CM | POA: Diagnosis present

## 2022-12-03 DIAGNOSIS — Z800 Family history of malignant neoplasm of digestive organs: Secondary | ICD-10-CM

## 2022-12-03 DIAGNOSIS — Z6841 Body Mass Index (BMI) 40.0 and over, adult: Secondary | ICD-10-CM | POA: Diagnosis not present

## 2022-12-03 DIAGNOSIS — Z66 Do not resuscitate: Secondary | ICD-10-CM | POA: Diagnosis present

## 2022-12-03 DIAGNOSIS — Z86718 Personal history of other venous thrombosis and embolism: Secondary | ICD-10-CM

## 2022-12-03 DIAGNOSIS — N1832 Chronic kidney disease, stage 3b: Secondary | ICD-10-CM | POA: Diagnosis present

## 2022-12-03 DIAGNOSIS — I4891 Unspecified atrial fibrillation: Secondary | ICD-10-CM | POA: Diagnosis not present

## 2022-12-03 DIAGNOSIS — R0602 Shortness of breath: Secondary | ICD-10-CM | POA: Diagnosis not present

## 2022-12-03 DIAGNOSIS — R339 Retention of urine, unspecified: Secondary | ICD-10-CM | POA: Diagnosis present

## 2022-12-03 DIAGNOSIS — Z87891 Personal history of nicotine dependence: Secondary | ICD-10-CM

## 2022-12-03 DIAGNOSIS — Z801 Family history of malignant neoplasm of trachea, bronchus and lung: Secondary | ICD-10-CM

## 2022-12-03 DIAGNOSIS — Z9981 Dependence on supplemental oxygen: Secondary | ICD-10-CM

## 2022-12-03 DIAGNOSIS — E2740 Unspecified adrenocortical insufficiency: Secondary | ICD-10-CM | POA: Diagnosis present

## 2022-12-03 DIAGNOSIS — R062 Wheezing: Secondary | ICD-10-CM | POA: Diagnosis not present

## 2022-12-03 DIAGNOSIS — B372 Candidiasis of skin and nail: Secondary | ICD-10-CM | POA: Diagnosis present

## 2022-12-03 DIAGNOSIS — J9621 Acute and chronic respiratory failure with hypoxia: Secondary | ICD-10-CM | POA: Diagnosis present

## 2022-12-03 DIAGNOSIS — H409 Unspecified glaucoma: Secondary | ICD-10-CM | POA: Diagnosis present

## 2022-12-03 DIAGNOSIS — R000 Tachycardia, unspecified: Secondary | ICD-10-CM | POA: Diagnosis not present

## 2022-12-03 DIAGNOSIS — Z91048 Other nonmedicinal substance allergy status: Secondary | ICD-10-CM

## 2022-12-03 LAB — BLOOD GAS, VENOUS
Acid-base deficit: 0.1 mmol/L (ref 0.0–2.0)
Bicarbonate: 26.8 mmol/L (ref 20.0–28.0)
Drawn by: 3492
O2 Saturation: 41.5 %
Patient temperature: 36.8
pCO2, Ven: 51 mmHg (ref 44–60)
pH, Ven: 7.32 (ref 7.25–7.43)
pO2, Ven: <31 mmHg — CL (ref 32–45)

## 2022-12-03 LAB — CBC WITH DIFFERENTIAL/PLATELET
Abs Immature Granulocytes: 0.03 10*3/uL (ref 0.00–0.07)
Basophils Absolute: 0 10*3/uL (ref 0.0–0.1)
Basophils Relative: 0 %
Eosinophils Absolute: 0.3 10*3/uL (ref 0.0–0.5)
Eosinophils Relative: 2 %
HCT: 38.5 % (ref 36.0–46.0)
Hemoglobin: 11.4 g/dL — ABNORMAL LOW (ref 12.0–15.0)
Immature Granulocytes: 0 %
Lymphocytes Relative: 26 %
Lymphs Abs: 2.9 10*3/uL (ref 0.7–4.0)
MCH: 26.2 pg (ref 26.0–34.0)
MCHC: 29.6 g/dL — ABNORMAL LOW (ref 30.0–36.0)
MCV: 88.5 fL (ref 80.0–100.0)
Monocytes Absolute: 0.9 10*3/uL (ref 0.1–1.0)
Monocytes Relative: 8 %
Neutro Abs: 7.2 10*3/uL (ref 1.7–7.7)
Neutrophils Relative %: 64 %
Platelets: 168 10*3/uL (ref 150–400)
RBC: 4.35 MIL/uL (ref 3.87–5.11)
WBC: 11.3 10*3/uL — ABNORMAL HIGH (ref 4.0–10.5)
nRBC: 0 % (ref 0.0–0.2)

## 2022-12-03 LAB — GLUCOSE, CAPILLARY
Glucose-Capillary: 248 mg/dL — ABNORMAL HIGH (ref 70–99)
Glucose-Capillary: 229 mg/dL — ABNORMAL HIGH (ref 70–99)

## 2022-12-03 LAB — COMPREHENSIVE METABOLIC PANEL
ALT: 20 U/L (ref 0–44)
AST: 22 U/L (ref 15–41)
Albumin: 3.3 g/dL — ABNORMAL LOW (ref 3.5–5.0)
Alkaline Phosphatase: 74 U/L (ref 38–126)
Anion gap: 9 (ref 5–15)
BUN: 15 mg/dL (ref 8–23)
CO2: 25 mmol/L (ref 22–32)
Calcium: 8.6 mg/dL — ABNORMAL LOW (ref 8.9–10.3)
Chloride: 106 mmol/L (ref 98–111)
Creatinine, Ser: 0.84 mg/dL (ref 0.44–1.00)
GFR, Estimated: >60 mL/min (ref >60)
Glucose, Bld: 184 mg/dL — ABNORMAL HIGH (ref 70–99)
Potassium: 3.6 mmol/L (ref 3.5–5.1)
Sodium: 140 mmol/L (ref 135–145)
Total Bilirubin: 0.7 mg/dL (ref 0.3–1.2)
Total Protein: 6.6 g/dL (ref 6.5–8.1)

## 2022-12-03 LAB — HEMOGLOBIN A1C
Hgb A1c MFr Bld: 7.3 % — ABNORMAL HIGH (ref 4.8–5.6)
Mean Plasma Glucose: 162.81 mg/dL

## 2022-12-03 LAB — LACTIC ACID, PLASMA
Lactic Acid, Venous: 2 mmol/L (ref 0.5–1.9)
Lactic Acid, Venous: 2.3 mmol/L (ref 0.5–1.9)

## 2022-12-03 LAB — TROPONIN I (HIGH SENSITIVITY): Troponin I (High Sensitivity): 29 ng/L — ABNORMAL HIGH (ref <18)

## 2022-12-03 LAB — BRAIN NATRIURETIC PEPTIDE: B Natriuretic Peptide: 267 pg/mL — ABNORMAL HIGH (ref 0.0–100.0)

## 2022-12-03 LAB — MRSA NEXT GEN BY PCR, NASAL: MRSA by PCR Next Gen: NOT DETECTED

## 2022-12-03 MED ORDER — APIXABAN 5 MG PO TABS: 5.0000 mg | ORAL_TABLET | ORAL | Status: DC

## 2022-12-03 MED ORDER — IPRATROPIUM-ALBUTEROL 0.5-2.5 (3) MG/3ML IN SOLN
2.0000 mL | Freq: Once | RESPIRATORY_TRACT | Status: AC
  Administered 2022-12-03: 2 mL via RESPIRATORY_TRACT
  Filled 2022-12-03: qty 3

## 2022-12-03 MED ORDER — LACTATED RINGERS IV BOLUS
500.0000 mL | Freq: Once | INTRAVENOUS | Status: AC
  Administered 2022-12-03: 500 mL via INTRAVENOUS

## 2022-12-03 MED ORDER — ACETAMINOPHEN 650 MG RE SUPP: 650.0000 mg | Freq: Four times a day (QID) | RECTAL | Status: DC | PRN

## 2022-12-03 MED ORDER — FENTANYL CITRATE PF 50 MCG/ML IJ SOSY: 12.5000 ug | PREFILLED_SYRINGE | INTRAMUSCULAR | Status: DC | PRN

## 2022-12-03 MED ORDER — BISACODYL 5 MG PO TBEC: 5.0000 mg | DELAYED_RELEASE_TABLET | Freq: Every day | ORAL | Status: DC | PRN

## 2022-12-03 MED ORDER — AMIODARONE HCL IN DEXTROSE 360-4.14 MG/200ML-% IV SOLN
30.0000 mg/h | INTRAVENOUS | Status: DC
  Administered 2022-12-03: 30 mg/h via INTRAVENOUS
  Filled 2022-12-03 (×3): qty 200

## 2022-12-03 MED ORDER — ACETAMINOPHEN 325 MG PO TABS
650.0000 mg | ORAL_TABLET | Freq: Four times a day (QID) | ORAL | Status: DC | PRN
  Administered 2022-12-04 – 2022-12-05 (×2): 650 mg via ORAL
  Filled 2022-12-03 (×2): qty 2

## 2022-12-03 MED ORDER — MOMETASONE FURO-FORMOTEROL FUM 100-5 MCG/ACT IN AERO
2.0000 | INHALATION_SPRAY | Freq: Two times a day (BID) | RESPIRATORY_TRACT | Status: DC
  Administered 2022-12-03 – 2022-12-06 (×5): 2 via RESPIRATORY_TRACT
  Filled 2022-12-03 (×2): qty 8.8

## 2022-12-03 MED ORDER — FUROSEMIDE 10 MG/ML IJ SOLN
40.0000 mg | Freq: Every day | INTRAMUSCULAR | Status: DC
  Administered 2022-12-04: 40 mg via INTRAVENOUS
  Filled 2022-12-03: qty 4

## 2022-12-03 MED ORDER — INSULIN ASPART 100 UNIT/ML IJ SOLN
0.0000 [IU] | Freq: Three times a day (TID) | INTRAMUSCULAR | Status: DC
  Administered 2022-12-03: 5 [IU] via SUBCUTANEOUS
  Administered 2022-12-04: 8 [IU] via SUBCUTANEOUS
  Administered 2022-12-04: 11 [IU] via SUBCUTANEOUS
  Administered 2022-12-04: 5 [IU] via SUBCUTANEOUS
  Administered 2022-12-05: 8 [IU] via SUBCUTANEOUS
  Administered 2022-12-05: 5 [IU] via SUBCUTANEOUS
  Administered 2022-12-05: 8 [IU] via SUBCUTANEOUS

## 2022-12-03 MED ORDER — ATORVASTATIN CALCIUM 40 MG PO TABS
80.0000 mg | ORAL_TABLET | Freq: Every day | ORAL | Status: DC
  Administered 2022-12-03 – 2022-12-05 (×3): 80 mg via ORAL
  Filled 2022-12-03 (×3): qty 2

## 2022-12-03 MED ORDER — MIRABEGRON ER 25 MG PO TB24
25.0000 mg | ORAL_TABLET | Freq: Every day | ORAL | Status: DC
  Administered 2022-12-03 – 2022-12-06 (×4): 25 mg via ORAL
  Filled 2022-12-03 (×4): qty 1

## 2022-12-03 MED ORDER — DEXTROMETHORPHAN POLISTIREX ER 30 MG/5ML PO SUER: 30.0000 mg | Freq: Two times a day (BID) | ORAL | Status: DC | PRN

## 2022-12-03 MED ORDER — LORATADINE 10 MG PO TABS
10.0000 mg | ORAL_TABLET | Freq: Every day | ORAL | Status: DC
  Administered 2022-12-03 – 2022-12-06 (×4): 10 mg via ORAL
  Filled 2022-12-03 (×4): qty 1

## 2022-12-03 MED ORDER — HYDROCORTISONE SOD SUC (PF) 100 MG IJ SOLR
100.0000 mg | Freq: Once | INTRAMUSCULAR | Status: AC
  Administered 2022-12-03: 100 mg via INTRAVENOUS
  Filled 2022-12-03: qty 2

## 2022-12-03 MED ORDER — APIXABAN 5 MG PO TABS
5.0000 mg | ORAL_TABLET | Freq: Two times a day (BID) | ORAL | Status: DC
  Administered 2022-12-03 – 2022-12-06 (×7): 5 mg via ORAL
  Filled 2022-12-03 (×7): qty 1

## 2022-12-03 MED ORDER — GABAPENTIN 400 MG PO CAPS
400.0000 mg | ORAL_CAPSULE | Freq: Three times a day (TID) | ORAL | Status: DC
  Administered 2022-12-03 – 2022-12-06 (×9): 400 mg via ORAL
  Filled 2022-12-03 (×9): qty 1

## 2022-12-03 MED ORDER — PANTOPRAZOLE SODIUM 40 MG IV SOLR
40.0000 mg | Freq: Every day | INTRAVENOUS | Status: DC
  Administered 2022-12-03 – 2022-12-05 (×3): 40 mg via INTRAVENOUS
  Filled 2022-12-03 (×3): qty 10

## 2022-12-03 MED ORDER — BISOPROLOL FUMARATE 5 MG PO TABS
5.0000 mg | ORAL_TABLET | Freq: Every day | ORAL | Status: DC
  Administered 2022-12-03 – 2022-12-06 (×4): 5 mg via ORAL
  Filled 2022-12-03 (×4): qty 1

## 2022-12-03 MED ORDER — OXYCODONE HCL 5 MG PO TABS
5.0000 mg | ORAL_TABLET | Freq: Four times a day (QID) | ORAL | Status: DC | PRN
  Administered 2022-12-04 – 2022-12-05 (×2): 5 mg via ORAL
  Filled 2022-12-03 (×3): qty 1

## 2022-12-03 MED ORDER — ONDANSETRON HCL 4 MG/2ML IJ SOLN
4.0000 mg | Freq: Four times a day (QID) | INTRAMUSCULAR | Status: DC | PRN
  Administered 2022-12-04 – 2022-12-05 (×3): 4 mg via INTRAVENOUS
  Filled 2022-12-03 (×3): qty 2

## 2022-12-03 MED ORDER — INSULIN ASPART 100 UNIT/ML IJ SOLN
0.0000 [IU] | Freq: Every day | INTRAMUSCULAR | Status: DC
  Administered 2022-12-03: 2 [IU] via SUBCUTANEOUS

## 2022-12-03 MED ORDER — FLUCONAZOLE 150 MG PO TABS
150.0000 mg | ORAL_TABLET | Freq: Once | ORAL | Status: AC
  Administered 2022-12-03: 150 mg via ORAL
  Filled 2022-12-03: qty 1

## 2022-12-03 MED ORDER — LEVOTHYROXINE SODIUM 75 MCG PO TABS
175.0000 ug | ORAL_TABLET | Freq: Every morning | ORAL | Status: DC
  Administered 2022-12-04 – 2022-12-06 (×3): 175 ug via ORAL
  Filled 2022-12-03 (×3): qty 1

## 2022-12-03 MED ORDER — GUAIFENESIN ER 600 MG PO TB12
1200.0000 mg | ORAL_TABLET | Freq: Two times a day (BID) | ORAL | Status: DC
  Administered 2022-12-03 – 2022-12-06 (×7): 1200 mg via ORAL
  Filled 2022-12-03 (×7): qty 2

## 2022-12-03 MED ORDER — DILTIAZEM HCL ER COATED BEADS 120 MG PO CP24
120.0000 mg | ORAL_CAPSULE | Freq: Every day | ORAL | Status: DC
  Filled 2022-12-03: qty 1

## 2022-12-03 MED ORDER — FERROUS SULFATE 325 (65 FE) MG PO TABS
325.0000 mg | ORAL_TABLET | Freq: Every day | ORAL | Status: DC
  Administered 2022-12-04 – 2022-12-06 (×3): 325 mg via ORAL
  Filled 2022-12-03 (×3): qty 1

## 2022-12-03 MED ORDER — NITROGLYCERIN 0.4 MG SL SUBL: 0.4000 mg | SUBLINGUAL_TABLET | SUBLINGUAL | Status: DC | PRN

## 2022-12-03 MED ORDER — ONDANSETRON HCL 4 MG PO TABS: 4.0000 mg | ORAL_TABLET | Freq: Four times a day (QID) | ORAL | Status: DC | PRN

## 2022-12-03 MED ORDER — AMIODARONE LOAD VIA INFUSION
150.0000 mg | Freq: Once | INTRAVENOUS | Status: AC
  Administered 2022-12-03: 150 mg via INTRAVENOUS
  Filled 2022-12-03: qty 83.34

## 2022-12-03 MED ORDER — DILTIAZEM HCL ER COATED BEADS 120 MG PO CP24
120.0000 mg | ORAL_CAPSULE | Freq: Every day | ORAL | Status: DC
  Administered 2022-12-04 – 2022-12-06 (×3): 120 mg via ORAL
  Filled 2022-12-03 (×3): qty 1

## 2022-12-03 MED ORDER — AMIODARONE HCL IN DEXTROSE 360-4.14 MG/200ML-% IV SOLN
60.0000 mg/h | INTRAVENOUS | Status: AC
  Administered 2022-12-03 (×2): 60 mg/h via INTRAVENOUS
  Filled 2022-12-03 (×3): qty 200

## 2022-12-03 MED ORDER — SODIUM CHLORIDE 0.9 % IV SOLN
100.0000 mg | Freq: Two times a day (BID) | INTRAVENOUS | Status: DC
  Administered 2022-12-03 – 2022-12-06 (×6): 100 mg via INTRAVENOUS
  Filled 2022-12-03 (×9): qty 100

## 2022-12-03 MED ORDER — NYSTATIN 100000 UNIT/GM EX POWD
Freq: Three times a day (TID) | CUTANEOUS | Status: DC
  Filled 2022-12-03 (×2): qty 15

## 2022-12-03 MED ORDER — FUROSEMIDE 10 MG/ML IJ SOLN
40.0000 mg | Freq: Once | INTRAMUSCULAR | Status: AC
  Administered 2022-12-03: 40 mg via INTRAVENOUS
  Filled 2022-12-03: qty 4

## 2022-12-03 MED ORDER — IPRATROPIUM-ALBUTEROL 0.5-2.5 (3) MG/3ML IN SOLN
3.0000 mL | Freq: Four times a day (QID) | RESPIRATORY_TRACT | Status: DC
  Administered 2022-12-03 – 2022-12-04 (×5): 3 mL via RESPIRATORY_TRACT
  Filled 2022-12-03 (×5): qty 3

## 2022-12-03 MED ORDER — CHLORHEXIDINE GLUCONATE CLOTH 2 % EX PADS
6.0000 | MEDICATED_PAD | Freq: Every day | CUTANEOUS | Status: DC
  Administered 2022-12-04 – 2022-12-06 (×3): 6 via TOPICAL

## 2022-12-03 MED ORDER — METHYLPREDNISOLONE SODIUM SUCC 40 MG IJ SOLR
40.0000 mg | Freq: Two times a day (BID) | INTRAMUSCULAR | Status: DC
  Administered 2022-12-04 – 2022-12-05 (×3): 40 mg via INTRAVENOUS
  Filled 2022-12-03 (×3): qty 1

## 2022-12-03 MED ORDER — INSULIN ASPART PROT & ASPART (70-30 MIX) 100 UNIT/ML ~~LOC~~ SUSP
35.0000 [IU] | Freq: Two times a day (BID) | SUBCUTANEOUS | Status: DC
  Administered 2022-12-03 – 2022-12-06 (×6): 35 [IU] via SUBCUTANEOUS
  Filled 2022-12-03: qty 10

## 2022-12-03 MED ORDER — CHLORHEXIDINE GLUCONATE CLOTH 2 % EX PADS
6.0000 | MEDICATED_PAD | Freq: Every day | CUTANEOUS | Status: DC
  Administered 2022-12-06: 6 via TOPICAL

## 2022-12-03 NOTE — ED Triage Notes (Signed)
Pt brought in from home by [facility] for SOB and COPD exacerbaion. Per EMS pt on 2L home oxygen and they placed pt on 10L non rebreather. Per EMS pt has wheezes all over lobes and was given 0.5 atrovent, 7.5 albuterol, and 125 mg solu medrol. Pt states diarrhea for over 3 weeks.
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
## 2022-12-03 NOTE — ED Notes (Signed)
Pt given lasix placed on purewik for strict I & Os.
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
## 2022-12-03 NOTE — Hospital Course (Addendum)
77 year old female who is DNR with adrenal insufficiency, coronary artery disease, diastolic heart failure, OHS, morbid obesity, type 2 diabetes mellitus, GERD, hyperlipidemia, hypothyroidism, paroxysmal atrial fibrillation, severe iron deficiency anemia presented to the emergency department today by EMS with acute on chronic hypoxic respiratory failure due to COPD exacerbation.  She received heavy dose of albuterol and route and developed atrial fibrillation with RVR with a heart rate in the 170s.  She also reported that she has been having diarrhea for the past 3 weeks.  She was initially placed on a nonrebreather.  She was given IV Solu-Medrol and route.  She subsequently received hydrocortisone in the ED.  She was seen by cardiology and started on IV amiodarone for A-fib RVR by Dr Eden Emms.  She appears volume overloaded with acute diastolic heart failure.  She is being admitted for further management.
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
## 2022-12-04 ENCOUNTER — Inpatient Hospital Stay (HOSPITAL_COMMUNITY): Payer: Medicare HMO

## 2022-12-04 ENCOUNTER — Other Ambulatory Visit: Payer: Self-pay | Admitting: "Endocrinology

## 2022-12-04 DIAGNOSIS — E1165 Type 2 diabetes mellitus with hyperglycemia: Secondary | ICD-10-CM

## 2022-12-04 DIAGNOSIS — J9621 Acute and chronic respiratory failure with hypoxia: Secondary | ICD-10-CM | POA: Diagnosis not present

## 2022-12-04 DIAGNOSIS — I4891 Unspecified atrial fibrillation: Secondary | ICD-10-CM | POA: Diagnosis not present

## 2022-12-04 LAB — GLUCOSE, CAPILLARY
Glucose-Capillary: 219 mg/dL — ABNORMAL HIGH (ref 70–99)
Glucose-Capillary: 213 mg/dL — ABNORMAL HIGH (ref 70–99)
Glucose-Capillary: 264 mg/dL — ABNORMAL HIGH (ref 70–99)
Glucose-Capillary: 307 mg/dL — ABNORMAL HIGH (ref 70–99)
Glucose-Capillary: 187 mg/dL — ABNORMAL HIGH (ref 70–99)

## 2022-12-04 LAB — CBC WITH DIFFERENTIAL/PLATELET
Abs Immature Granulocytes: 0.1 10*3/uL — ABNORMAL HIGH (ref 0.00–0.07)
Basophils Absolute: 0 10*3/uL (ref 0.0–0.1)
Basophils Relative: 0 %
Eosinophils Absolute: 0 10*3/uL (ref 0.0–0.5)
Eosinophils Relative: 0 %
HCT: 36.8 % (ref 36.0–46.0)
Hemoglobin: 11 g/dL — ABNORMAL LOW (ref 12.0–15.0)
Immature Granulocytes: 1 %
Lymphocytes Relative: 8 %
Lymphs Abs: 0.9 10*3/uL (ref 0.7–4.0)
MCH: 26.3 pg (ref 26.0–34.0)
MCHC: 29.9 g/dL — ABNORMAL LOW (ref 30.0–36.0)
MCV: 87.8 fL (ref 80.0–100.0)
Monocytes Absolute: 0.3 10*3/uL (ref 0.1–1.0)
Monocytes Relative: 3 %
Neutro Abs: 9.8 10*3/uL — ABNORMAL HIGH (ref 1.7–7.7)
Neutrophils Relative %: 88 %
Platelets: 186 10*3/uL (ref 150–400)
RBC: 4.19 MIL/uL (ref 3.87–5.11)
WBC: 11.1 10*3/uL — ABNORMAL HIGH (ref 4.0–10.5)
nRBC: 0 % (ref 0.0–0.2)

## 2022-12-04 LAB — BASIC METABOLIC PANEL
Anion gap: 8 (ref 5–15)
BUN: 13 mg/dL (ref 8–23)
CO2: 26 mmol/L (ref 22–32)
Calcium: 8.5 mg/dL — ABNORMAL LOW (ref 8.9–10.3)
Chloride: 102 mmol/L (ref 98–111)
Creatinine, Ser: 0.79 mg/dL (ref 0.44–1.00)
GFR, Estimated: >60 mL/min (ref >60)
Glucose, Bld: 241 mg/dL — ABNORMAL HIGH (ref 70–99)
Potassium: 4.1 mmol/L (ref 3.5–5.1)
Sodium: 136 mmol/L (ref 135–145)

## 2022-12-04 LAB — MAGNESIUM: Magnesium: 1.8 mg/dL (ref 1.7–2.4)

## 2022-12-04 MED ORDER — ALBUTEROL SULFATE (2.5 MG/3ML) 0.083% IN NEBU: 2.5000 mg | INHALATION_SOLUTION | Freq: Four times a day (QID) | RESPIRATORY_TRACT | Status: DC | PRN

## 2022-12-04 MED ORDER — IPRATROPIUM-ALBUTEROL 0.5-2.5 (3) MG/3ML IN SOLN
3.0000 mL | Freq: Three times a day (TID) | RESPIRATORY_TRACT | Status: DC
  Administered 2022-12-05 – 2022-12-06 (×4): 3 mL via RESPIRATORY_TRACT
  Filled 2022-12-04 (×4): qty 3

## 2022-12-04 NOTE — Plan of Care (Signed)
  Problem: Acute Rehab PT Goals(only PT should resolve) Goal: Pt Will Go Supine/Side To Sit Outcome: Progressing Flowsheets (Taken 12/04/2022 1604) Pt will go Supine/Side to Sit:  with modified independence  Independently Goal: Patient Will Transfer Sit To/From Stand Outcome: Progressing Flowsheets (Taken 12/04/2022 1604) Patient will transfer sit to/from stand: with modified independence Goal: Pt Will Transfer Bed To Chair/Chair To Bed Outcome: Progressing Flowsheets (Taken 12/04/2022 1604) Pt will Transfer Bed to Chair/Chair to Bed: with modified independence Goal: Pt Will Ambulate Outcome: Progressing Flowsheets (Taken 12/04/2022 1604) Pt will Ambulate:  75 feet  with modified independence  with rolling walker   4:04 PM, 12/04/22 Ocie Bob, MPT Physical Therapist with [facility] 336 [phone] office [phone] mobile phone

## 2022-12-04 NOTE — Progress Notes (Signed)
PROGRESS NOTE    Ann [name]  ZOX:[id number] DOB: [birth year] DOA: 12/03/2022 PCP: Rica Records, FNP   Brief Narrative:  Ann [name] is a 77 year old female who is DNR with adrenal insufficiency, coronary artery disease, diastolic heart failure, OHS, morbid obesity, type 2 diabetes mellitus, GERD, hyperlipidemia, hypothyroidism, paroxysmal atrial fibrillation, severe iron deficiency anemia presented to the emergency department today by EMS with chronic hypoxic due to COPD on 2L [place]. Presents with afib/RVR and respiratory distress. Hospitalist called for admission, cardiology called in consult.  Assessment & Plan:   Principal Problem:   Acute on chronic respiratory failure with hypoxia (HCC) Active Problems:   Acquired hypothyroidism   Mixed hyperlipidemia   Essential hypertension   Coronary atherosclerosis   Uncontrolled type 2 diabetes mellitus with hyperglycemia (HCC)   Chronic diastolic heart failure (HCC)   Long term current use of anticoagulant therapy   Class 3 obesity (HCC)   Chronic kidney disease, stage III (moderate) (HCC)   Atrial fibrillation, chronic (HCC)   COPD with acute exacerbation (HCC)   Atrial fibrillation with rapid ventricular response (HCC)   COPD GOLD 0 / 02 dep / prob Cor pulmonale   Weakness   Anxiety   Acute on chronic respiratory failure with hypoxia secondary to acute COPD exacerbation -Baseline oxygen 2 L nasal cannula around-the-clock, occasionally increases during exertion  -Continue IV steroids, nebs, respiratory status improving but not yet back to baseline -Continue doxycycline x 5 days   Acute HFpEF, EF 60-65% improving -Last echo April of this year no indication to repeat  -Continue IV diuretics, monitor daily weights I's and O's -Likely playing a role with above acute hypoxia   Atrial fibrillation with RVR, CHA2DS2-VASc 5 -Likely provoked secondary to above hypoxia and multiple comorbidities -IV amiodarone ongoing,  home Cardizem and bisoprolol continued, remains on Eliquis   Adrenal Insufficiency, chronic -Continue home prednisone, transition steroids as above   Uncontrolled type 2 diabetes mellitis with neurological complications with hyperglycemia Polyneuropathy of diabetes mellitus -Continue insulin 70/30 -35 units twice daily -Continue sliding scale insulin, hypoglycemic protocol -Likely poorly controlled with steroid taper, monitor closely  Hypothyroidism  -Continue levothyroxine   Essential hypertension  -Borderline hypotensive at intake, resume home medications as appropriate, continue bisoprolol, Cardizem, Lasix given above    Acute Urinary Retention  -Unclear etiology, In-N-Out cath x 1 in the ED with over 600 mL output -Once aggressive diuresis has improved we will transition to p.o. diuretics and remove Foley for voiding trial   Stage 3b CKD  - monitor renal function closely in the setting of IV furosemide diuresis    Diabetic Dyslipidemia -Continue home atorvastatin 80 mg daily    Severe Iron Deficiency Anemia - she is followed by Dr. Ellin Saba (hematologist) - Iron sulfate 325 mg daily   Candidal Intertrigo - fluconazole 150 mg  - topical nystatin powder application to affected areas TID    GERD - IV pantoprazole ordered for GI protection   DVT prophylaxis:  apixaban (ELIQUIS) tablet 5 mg   Code Status:   Code Status: DNR  Family Communication: None  Status is: Inpatient  Dispo: The patient is from: Home              Anticipated d/c is to: To be determined              Anticipated d/c date is: 48 to 72 hours              Patient currently not medically  stable for discharge  Consultants:  Cardiology  Procedures:  None  Antimicrobials:  Doxycycline x 5 days  Subjective: No acute issues or events overnight denies nausea vomiting diarrhea constipation headache fevers chills or chest pain.  Shortness of breath ongoing, patient continues to have palpitations  during coughing episode today with tachycardia up to 130s  Objective: Vitals:   12/04/22 0500 12/04/22 0600 12/04/22 0731 12/04/22 0745  BP:      Pulse: (!) 56 92    Resp: (!) 22 (!) 23    Temp:    (!) 97.4 F (36.3 C)  TempSrc:    Oral  SpO2: 98% 99% 99%   Weight:      Height:        Intake/Output Summary (Last 24 hours) at 12/04/2022 0748 Last data filed at 12/04/2022 0445 Gross per 24 hour  Intake 1191.27 ml  Output 1900 ml  Net -708.73 ml   Filed Weights   12/03/22 1119 12/04/22 0411  Weight: 126.1 kg 127.7 kg    Examination:  General:  Pleasantly resting in bed, No acute distress. HEENT:  Normocephalic atraumatic.  Sclerae nonicteric, noninjected.  Extraocular movements intact bilaterally. Neck:  Without mass or deformity.  Trachea is midline. Lungs: Bibasilar rales Heart: Irregular irregular rate/rhythm.  Without murmurs, rubs, or gallops. Abdomen:  Soft, nontender, nondistended.  Without guarding or rebound. Extremities: 2+ pitting edema bilateral lower extremities. Skin:  Warm and dry, no erythema.  Data Reviewed: I have personally reviewed following labs and imaging studies  CBC: Recent Labs  Lab 12/03/22 1116  WBC 11.3*  NEUTROABS 7.2  HGB 11.4*  HCT 38.5  MCV 88.5  PLT 168   Basic Metabolic Panel: Recent Labs  Lab 12/03/22 1116 12/04/22 0514  NA 140 136  K 3.6 4.1  CL 106 102  CO2 25 26  GLUCOSE 184* 241*  BUN 15 13  CREATININE 0.84 0.79  CALCIUM 8.6* 8.5*  MG  --  1.8   GFR: Estimated Creatinine Clearance: 76.7 mL/min (by C-G formula based on SCr of 0.79 mg/dL). Liver Function Tests: Recent Labs  Lab 12/03/22 1116  AST 22  ALT 20  ALKPHOS 74  BILITOT 0.7  PROT 6.6  ALBUMIN 3.3*   No results for input(s): "LIPASE", "AMYLASE" in the last 168 hours. No results for input(s): "AMMONIA" in the last 168 hours. Coagulation Profile: No results for input(s): "INR", "PROTIME" in the last 168 hours. Cardiac Enzymes: No results for  input(s): "CKTOTAL", "CKMB", "CKMBINDEX", "TROPONINI" in the last 168 hours. BNP (last 3 results) Recent Labs    10/14/22 1042  PROBNP 2,295*   HbA1C: Recent Labs    12/03/22 1116  HGBA1C 7.3*   CBG: Recent Labs  Lab 12/03/22 1548 12/03/22 2108 12/04/22 0322 12/04/22 0727  GLUCAP 248* 229* 219* 213*   Lipid Profile: No results for input(s): "CHOL", "HDL", "LDLCALC", "TRIG", "CHOLHDL", "LDLDIRECT" in the last 72 hours. Thyroid Function Tests: No results for input(s): "TSH", "T4TOTAL", "FREET4", "T3FREE", "THYROIDAB" in the last 72 hours. Anemia Panel: No results for input(s): "VITAMINB12", "FOLATE", "FERRITIN", "TIBC", "IRON", "RETICCTPCT" in the last 72 hours. Sepsis Labs: Recent Labs  Lab 12/03/22 1125 12/03/22 1353  LATICACIDVEN 2.0* 2.3*    Recent Results (from the past 240 hour(s))  Blood culture (routine x 2)     Status: None (Preliminary result)   Collection Time: 12/03/22 11:16 AM   Specimen: BLOOD  Result Value Ref Range Status   Specimen Description BLOOD BLOOD LEFT HAND  Final  Special Requests   Final    BOTTLES DRAWN AEROBIC ONLY Blood Culture adequate volume   Culture   Final    NO GROWTH < 24 HOURS Performed at [facility], [address] [place], [place] [address]    Report Status PENDING  Incomplete  Blood culture (routine x 2)     Status: None (Preliminary result)   Collection Time: 12/03/22 11:46 AM   Specimen: BLOOD  Result Value Ref Range Status   Specimen Description BLOOD LEFT ANTECUBITAL  Final   Special Requests   Final    BOTTLES DRAWN AEROBIC AND ANAEROBIC Blood Culture adequate volume   Culture   Final    NO GROWTH < 24 HOURS Performed at [facility], [address], [place], [place] [address]    Report Status PENDING  Incomplete  MRSA Next Gen by PCR, Nasal     Status: None   Collection Time: 12/03/22  4:04 PM   Specimen: Nasal Mucosa; Nasal Swab  Result Value Ref Range Status   MRSA by PCR Next Gen NOT DETECTED NOT  DETECTED Final    Comment: (NOTE) The GeneXpert MRSA Assay (FDA approved for NASAL specimens only), is one component of a comprehensive MRSA colonization surveillance program. It is not intended to diagnose MRSA infection nor to guide or monitor treatment for MRSA infections. Test performance is not FDA approved in patients less than 74 years old. Performed at [facility], [address], [place], [place] [address]          Radiology Studies: Portable chest 1 View  Result Date: 12/04/2022 CLINICAL DATA:  77 year old female with acute on chronic respiratory failure. Possible congestive heart failure. EXAM: PORTABLE CHEST 1 VIEW COMPARISON:  Chest x-ray 12/03/2022. FINDINGS: Lung volumes are slightly low. Persistent linear opacities in the left mid lung likely reflect subsegmental atelectasis. No acute consolidative airspace disease. No pleural effusions. No pneumothorax. Cephalization of the pulmonary vasculature, without frank pulmonary edema. Mild cardiomegaly. Upper mediastinal contours are within normal limits allowing for patient positioning. Atherosclerotic calcifications in the thoracic aorta. Orthopedic fixation hardware in the lower cervical spine incidentally noted. IMPRESSION: 1. Cardiomegaly with pulmonary venous congestion, but no frank pulmonary edema. 2. Probable subsegmental atelectasis in the left mid lung, similar to the prior study. 3. Aortic atherosclerosis. Electronically Signed   By: Trudie Reed M.D.   On: 12/04/2022 05:40   DG Chest Portable 1 View  Result Date: 12/03/2022 CLINICAL DATA:  Shortness of breath EXAM: PORTABLE CHEST 1 VIEW COMPARISON:  09/16/2022 FINDINGS: Transverse diameter of heart is increased. Linear densities in left parahilar region may suggest scarring or subsegmental atelectasis. There are no signs of alveolar pulmonary edema. No new focal pulmonary consolidation is seen. There is no significant pleural effusion or pneumothorax. There is previous  surgical fusion in lower cervical spine. IMPRESSION: Cardiomegaly. Linear densities in left parahilar region suggest scarring or subsegmental atelectasis. There are no signs of alveolar pulmonary edema or focal pulmonary consolidation. Electronically Signed   By: Ernie Avena M.D.   On: 12/03/2022 12:00        Scheduled Meds:  apixaban  5 mg Oral BID   atorvastatin  80 mg Oral q1800   bisoprolol  5 mg Oral Daily   Chlorhexidine Gluconate Cloth  6 each Topical Q0600   Chlorhexidine Gluconate Cloth  6 each Topical Q0600   diltiazem  120 mg Oral Daily   ferrous sulfate  325 mg Oral QAC breakfast   furosemide  40 mg Intravenous  Daily   gabapentin  400 mg Oral TID   guaiFENesin  1,200 mg Oral BID   insulin aspart  0-15 Units Subcutaneous TID WC   insulin aspart  0-5 Units Subcutaneous QHS   insulin aspart protamine- aspart  35 Units Subcutaneous BID AC   ipratropium-albuterol  3 mL Nebulization Q6H   levothyroxine  175 mcg Oral q morning   loratadine  10 mg Oral Daily   methylPREDNISolone (SOLU-MEDROL) injection  40 mg Intravenous Q12H   mirabegron ER  25 mg Oral Daily   mometasone-formoterol  2 puff Inhalation BID   nystatin   Topical TID   pantoprazole (PROTONIX) IV  40 mg Intravenous QHS   Continuous Infusions:  amiodarone 30 mg/hr (12/04/22 0445)   doxycycline (VIBRAMYCIN) IV 100 mg (12/04/22 0537)     LOS: 1 day    Time spent: 45 minutes    Azucena Fallen, DO [facility]  If 7PM-7AM, please contact night-coverage [email or web address]  12/04/2022, 7:48 AM

## 2022-12-04 NOTE — TOC Initial Note (Signed)
Transition of Care [facility]) - Initial/Assessment Note    Patient Details  Name: Ann [name] MRN: [id number] Date of Birth: 1945-06-18  [facility] CM/SW Contact:    Villa Herb, LCSWA Phone Number: 12/04/2022, 10:27 AM  Clinical Narrative:                 Pt is high risk for readmission. Pt from home with spouse and family. Pt has home O2 supplied through [employer]. Pt has previously declined HH services, if needed prior to D/C TOC will assist in setting up if pt agrees. Pt has a cane and walker to use when needed in the home. TOC to follow for needs.   Expected Discharge Plan: Home/Self Care Barriers to Discharge: Continued Medical Work up   Patient Goals and CMS Choice Patient states their goals for this hospitalization and ongoing recovery are:: return home CMS Medicare.gov Compare Post Acute Care list provided to:: Patient Choice offered to / list presented to : Patient      Expected Discharge Plan and Services In-house Referral: Clinical Social Work Discharge Planning Services: CM Consult   Living arrangements for the past 2 months: Single Family Home                                      Prior Living Arrangements/Services Living arrangements for the past 2 months: Single Family Home Lives with:: Spouse, Adult Children, Relatives Patient language and need for interpreter reviewed:: Yes Do you feel safe going back to the place where you live?: Yes      Need for Family Participation in Patient Care: Yes (Comment) Care giver support system in place?: Yes (comment) Current home services: DME Criminal Activity/Legal Involvement Pertinent to Current Situation/Hospitalization: No - Comment as needed  Activities of Daily Living      Permission Sought/Granted                  Emotional Assessment Appearance:: Appears stated age Attitude/Demeanor/Rapport: Engaged Affect (typically observed): Accepting   Alcohol / Substance Use: Not  Applicable Psych Involvement: No (comment)  Admission diagnosis:  Acute on chronic respiratory failure with hypoxia (HCC) [J96.21] Patient Active Problem List   Diagnosis Date Noted   Acute on chronic respiratory failure with hypoxia (HCC) 12/03/2022   Iron deficiency anemia due to chronic blood loss 10/29/2022   Elevated brain natriuretic peptide (BNP) level 09/17/2022   Chronic respiratory failure with hypoxia (HCC) 04/30/2022   History of colostomy reversal 12/18/2020   Abnormality of gait 07/04/2020   Colostomy in place [facility]) 06/13/2020   Colostomy, retracted (HCC) 06/13/2020   Labile blood glucose    Supplemental oxygen dependent    Steroid-induced hyperglycemia    Controlled type 2 diabetes mellitus with hyperglycemia, with long-term current use of insulin (HCC)    Hypoalbuminemia due to protein-calorie malnutrition (HCC)    Chronic diastolic congestive heart failure (HCC)    Acute blood loss anemia    Debility 05/25/2020   Sigmoid Diverticulitis with Perforation-- 05/15/2020   Diverticulitis of large intestine with perforation with bleeding    Sepsis Due to Sigmoid Diverticulitis with Perforation 05/14/2020   Acute GI bleeding 05/14/2020   Acute on chronic anemia-Due to Acute Blood Loss 05/14/2020   Acute respiratory failure with hypoxia (HCC) 01/24/2020   Atrial fibrillation with RVR (HCC) 01/23/2020   Anxiety    Uncontrolled type 2 diabetes mellitus with hyperglycemia, with  long-term current use of insulin (HCC)    Weakness 12/10/2019   Acute pain of left knee 08/31/2019   History of DVT (deep vein thrombosis)    Coronary artery disease of native artery of native heart with stable angina pectoris (HCC)    COPD GOLD 0 / 02 dep / prob Cor pulmonale    Atrial fibrillation with rapid ventricular response (HCC) 07/13/2019   COPD with acute exacerbation (HCC) 04/04/2018   Comprehensive diabetic foot examination, type 2 DM, encounter for (HCC) 04/04/2018   PAF (paroxysmal  atrial fibrillation) (HCC) 04/04/2018   Constipation by delayed colonic transit 10/23/2016   Fatty liver 12/11/2015   GERD without esophagitis 07/14/2015   Hypotension 03/15/2013   Elevated troponin 03/15/2013   VRE (vancomycin resistant enterococcus) culture positive 10/10/2012   Nonsustained ventricular tachycardia (HCC) 10/10/2012   Cellulitis of arm, right 10/06/2012   Hydronephrosis 09/15/2012   Abdominal mass, right lower quadrant 09/10/2012   Microcytic anemia 08/22/2012   Acute on chronic diastolic heart failure (HCC) 08/06/2012   UTI (urinary tract infection) 07/30/2012   Atrial fibrillation, chronic (HCC) 07/02/2012   Vitamin B12 deficiency 06/28/2012   Sinusitis chronic, frontal 06/28/2012   Class 3 obesity (HCC) 06/28/2012   Chronic kidney disease, stage III (moderate) (HCC) 06/28/2012   ARF (acute renal failure) (HCC) 06/27/2012   Long term current use of anticoagulant therapy 06/27/2012   DOE (dyspnea on exertion) 06/27/2012   Adrenal insufficiency (HCC) 01/07/2012   Uncontrolled type 2 diabetes mellitus with hyperglycemia (HCC) 04/19/2011   Chronic diastolic heart failure (HCC) 04/19/2011   GANGLION OF TENDON SHEATH 02/21/2010   Acquired hypothyroidism 12/01/2008   Mixed hyperlipidemia 12/01/2008   Essential hypertension 12/01/2008   Coronary atherosclerosis 12/01/2008   RENAL FAILURE, CHRONIC 12/01/2008   PCP:  Rica Records, FNP Pharmacy:   [facility] [phone] - [place], [place] - [address]  [place] [address] Phone: [phone] Fax: [phone]  [facility] - [place], [place] - [address] [facility] [address] [place] [address] [place] [address] Phone: [phone] Fax: [phone]     Social Determinants of Health (SDOH) Social History: SDOH Screenings   Food Insecurity: No Food Insecurity (10/30/2022)  Housing: Low Risk  (10/30/2022)  Transportation Needs: No  Transportation Needs (10/30/2022)  Utilities: Not At Risk (10/30/2022)  Alcohol Screen: Low Risk  (10/22/2022)  Depression (PHQ2-9): Low Risk  (10/30/2022)  Recent Concern: Depression (PHQ2-9) - High Risk (10/16/2022)  Financial Resource Strain: Low Risk  (10/22/2022)  Physical Activity: Insufficiently Active (10/22/2022)  Social Connections: Moderately Integrated (10/22/2022)  Stress: No Stress Concern Present (10/22/2022)  Tobacco Use: Medium Risk (12/03/2022)   SDOH Interventions:     Readmission Risk Interventions    12/04/2022   10:26 AM 09/18/2022    8:34 AM 12/19/2020    4:03 PM  Readmission Risk Prevention Plan  Transportation Screening Complete Complete Complete  HRI or Home Care Consult Complete Complete   Social Work Consult for Recovery Care Planning/Counseling Complete Complete   Palliative Care Screening Not Applicable Not Applicable   Medication Review Oceanographer) Complete Complete Complete  HRI or Home Care Consult   Complete  SW Recovery Care/Counseling Consult   Complete  Palliative Care Screening   Not Applicable  Skilled Nursing Facility   Not Applicable

## 2022-12-04 NOTE — Evaluation (Signed)
Physical Therapy Evaluation Patient Details Name: JAQUITA BESSIRE MRN: [id number] DOB: 19-Jul-1945 Today's Date: 12/04/2022  History of Present Illness  Ann [name] is a 77 year old female who is DNR with adrenal insufficiency, coronary artery disease, diastolic heart failure, OHS, morbid obesity, type 2 diabetes mellitus, GERD, hyperlipidemia, hypothyroidism, paroxysmal atrial fibrillation, severe iron deficiency anemia presented to the emergency department today by EMS with acute on chronic hypoxic respiratory failure due to COPD exacerbation.  She received heavy dose of albuterol and route and developed atrial fibrillation with RVR with a heart rate in the 170s.  She also reported that she has been having diarrhea for the past 3 weeks.  She was initially placed on a nonrebreather.  She was given IV Solu-Medrol and route.  She subsequently received hydrocortisone in the ED.  She was seen by cardiology and started on IV amiodarone for A-fib RVR by Dr Eden Emms.  She appears volume overloaded with acute diastolic heart failure.  She is being admitted for further management.   Clinical Impression  Patient demonstrates slightly labored movement for sitting up at bedside with [facility] partially raised, good return for transferring to chair using RW, able to ambulate in room/hallway without loss of balance, but limited mostly due to SOB with SpO2 dropping from 94% to 85%, put on 2 LPM for returning to room and SpO2 increase above 95% while on 2 LPM sitting in chair - RN notified.  Patient will benefit from continued skilled physical therapy in hospital and recommended venue below to increase strength, balance, endurance for safe ADLs and gait.          Assistance Recommended at Discharge Set up Supervision/Assistance  If plan is discharge home, recommend the following:  Can travel by private vehicle  A little help with walking and/or transfers;A little help with bathing/dressing/bathroom;Help with stairs or  ramp for entrance;Assistance with cooking/housework        Equipment Recommendations None recommended by PT  Recommendations for Other Services       Functional Status Assessment Patient has had a recent decline in their functional status and demonstrates the ability to make significant improvements in function in a reasonable and predictable amount of time.     Precautions / Restrictions Precautions Precautions: Fall Restrictions Weight Bearing Restrictions: No      Mobility  Bed Mobility Overal bed mobility: Modified Independent                  Transfers Overall transfer level: Modified independent                      Ambulation/Gait Ambulation/Gait assistance: Supervision Gait Distance (Feet): 45 Feet Assistive device: Rolling walker (2 wheels) Gait Pattern/deviations: Step-through pattern, Decreased step length - right, Decreased stride length, Trunk flexed Gait velocity: decreased     General Gait Details: slightly labored cadence without loss of balance, limited mostly due to SOB with SpO2 dropping from 94% to 85 % while on room air, put back on 2 LPM returning to room  Stairs            Wheelchair Mobility     Tilt Bed    Modified Rankin (Stroke Patients Only)       Balance Overall balance assessment: Needs assistance Sitting-balance support: Feet supported, No upper extremity supported Sitting balance-Leahy Scale: Good Sitting balance - Comments: seated at EOB   Standing balance support: During functional activity, Bilateral upper extremity supported Standing balance-Leahy Scale: Fair Standing balance comment:  fair/good using RW                             Pertinent Vitals/Pain Pain Assessment Pain Assessment: No/denies pain    Home Living Family/patient expects to be discharged to:: Private residence Living Arrangements: Spouse/significant other Available Help at Discharge: Family;Available 24  hours/day Type of Home: House Home Access: Ramped entrance     Alternate Level Stairs-Number of Steps: flight Home Layout: Two level;Able to live on main level with bedroom/bathroom;Full bath on main level Home Equipment: Rollator (4 wheels);BSC/3in1;Grab bars - tub/shower;Wheelchair - manual;Hospital bed      Prior Function Prior Level of Function : Needs assist       Physical Assist : Mobility (physical);ADLs (physical) Mobility (physical): Bed mobility;Transfers;Gait;Stairs   Mobility Comments: household ambulator using RW ADLs Comments: Assisted by family     Hand Dominance   Dominant Hand: Right    Extremity/Trunk Assessment   Upper Extremity Assessment Upper Extremity Assessment: Overall WFL for tasks assessed    Lower Extremity Assessment Lower Extremity Assessment: Generalized weakness    Cervical / Trunk Assessment Cervical / Trunk Assessment: Normal  Communication   Communication: No difficulties  Cognition Arousal/Alertness: Awake/alert Behavior During Therapy: WFL for tasks assessed/performed Overall Cognitive Status: Within Functional Limits for tasks assessed                                          General Comments      Exercises     Assessment/Plan    PT Assessment Patient needs continued PT services  PT Problem List Decreased strength;Decreased activity tolerance;Decreased balance;Decreased mobility       PT Treatment Interventions DME instruction;Gait training;Stair training;Functional mobility training;Therapeutic activities;Therapeutic exercise;Patient/family education;Balance training    PT Goals (Current goals can be found in the Care Plan section)  Acute Rehab PT Goals Patient Stated Goal: return home with family to assist PT Goal Formulation: With patient Time For Goal Achievement: 12/07/22 Potential to Achieve Goals: Good    Frequency Min 3X/week     Co-evaluation               AM-PAC PT "6  Clicks" Mobility  Outcome Measure Help needed turning from your back to your side while in a flat bed without using bedrails?: None Help needed moving from lying on your back to sitting on the side of a flat bed without using bedrails?: None Help needed moving to and from a bed to a chair (including a wheelchair)?: None Help needed standing up from a chair using your arms (e.g., wheelchair or bedside chair)?: A Little Help needed to walk in hospital room?: A Little Help needed climbing 3-5 steps with a railing? : A Lot 6 Click Score: 20    End of Session Equipment Utilized During Treatment: Oxygen Activity Tolerance: Patient tolerated treatment well;Patient limited by fatigue Patient left: in chair;with call bell/phone within reach Nurse Communication: Mobility status PT Visit Diagnosis: Other abnormalities of gait and mobility (R26.89);Muscle weakness (generalized) (M62.81);Unsteadiness on feet (R26.81)    Time: 1610-9604 PT Time Calculation (min) (ACUTE ONLY): 27 min   Charges:   PT Evaluation $PT Eval Moderate Complexity: 1 Mod PT Treatments $Therapeutic Activity: 23-37 mins PT General Charges $$ ACUTE PT VISIT: 1 Visit         4:02 PM, 12/04/22 Ocie Bob, MPT Physical  Therapist with [facility] [phone] office [phone] mobile phone

## 2022-12-04 NOTE — Progress Notes (Signed)
Progress Note  Patient Name: Ann [name] Date of Encounter: 12/04/2022  Primary Cardiologist: Dina Rich, MD  Interval Summary   Coughing spell this morning, currently settled down.  Heart rate in the 80s to 90s in atrial fibrillation by telemetry.  No chest pain.  Does not feel palpitations typically.  Vital Signs    Vitals:   12/04/22 0600 12/04/22 0731 12/04/22 0745 12/04/22 0813  BP:    (!) 145/97  Pulse: 92   (!) 101  Resp: (!) 23   20  Temp:   (!) 97.4 F (36.3 C)   TempSrc:   Oral   SpO2: 99% 99%  93%  Weight:      Height:        Intake/Output Summary (Last 24 hours) at 12/04/2022 0940 Last data filed at 12/04/2022 0841 Gross per 24 hour  Intake 1431.27 ml  Output 1900 ml  Net -468.73 ml   Filed Weights   12/03/22 1119 12/04/22 0411  Weight: 126.1 kg 127.7 kg    Physical Exam   GEN: No acute distress.   Neck: No JVD. Cardiac: Irregularly irregular without gallop.  Respiratory: Nonlabored.  Coarse breath sounds without wheezing. GI: Soft, nontender, bowel sounds present. MS: No edema.  ECG/Telemetry    Atrial fibrillation.  Labs    Chemistry Recent Labs  Lab 12/03/22 1116 12/04/22 0514  NA 140 136  K 3.6 4.1  CL 106 102  CO2 25 26  GLUCOSE 184* 241*  BUN 15 13  CREATININE 0.84 0.79  CALCIUM 8.6* 8.5*  PROT 6.6  --   ALBUMIN 3.3*  --   AST 22  --   ALT 20  --   ALKPHOS 74  --   BILITOT 0.7  --   GFRNONAA >60 >60  ANIONGAP 9 8    Hematology Recent Labs  Lab 12/03/22 1116 12/04/22 0625  WBC 11.3* 11.1*  RBC 4.35 4.19  HGB 11.4* 11.0*  HCT 38.5 36.8  MCV 88.5 87.8  MCH 26.2 26.3  MCHC 29.6* 29.9*  RDW Not Measured Not Measured  PLT 168 186   Cardiac Enzymes Recent Labs  Lab 12/03/22 1116  TROPONINIHS 29*   Lipid Panel     Component Value Date/Time   CHOL 127 10/16/2022 1533   TRIG 127 10/16/2022 1533   HDL 43 10/16/2022 1533   CHOLHDL 3.0 10/16/2022 1533   CHOLHDL 4.4 05/17/2016 0923   VLDL 36 (H)  05/17/2016 0923   LDLCALC 61 10/16/2022 1533   LABVLDL 23 10/16/2022 1533    Cardiac Studies   Echocardiogram 09/18/2022:  1. Left ventricular ejection fraction, by estimation, is 60 to 65%. The  left ventricle has normal function. The left ventricle has no regional  wall motion abnormalities. There is mild left ventricular hypertrophy.  Left ventricular diastolic function  could not be evaluated. The E/e' is 25.   2. Right ventricular systolic function is normal. The right ventricular  size is normal. Tricuspid regurgitation signal is inadequate for assessing  PA pressure.   3. Left atrial size was moderately dilated.   4. The mitral valve is normal in structure. No evidence of mitral valve  regurgitation. No evidence of mitral stenosis.   5. The aortic valve has an indeterminant number of cusps. Aortic valve  regurgitation is not visualized. No aortic stenosis is present.   6. Aortic dilatation noted. There is borderline dilatation of the aortic  root, measuring 38 mm. There is mild dilatation of the ascending aorta,  measuring 44 mm.   7. The inferior vena cava is dilated in size with >50% respiratory  variability, suggesting right atrial pressure of 8 mmHg.   Assessment & Plan   1.  Permanent atrial fibrillation with CHA2DS2-VASc score of 5.  Heart rates elevated in the setting of acute illness including COPD exacerbation and acute on chronic hypoxic respiratory failure.  Temporarily on IV amiodarone along with home doses of Cardizem CD and bisoprolol.  Anticoagulation is with Eliquis.  2.  HFpEF with acute exacerbation.  LVEF 60 to 65% with normal RV contraction as of echocardiogram in April.  Currently on IV Lasix. Net output of approximately 800 cc so far.  Renal function stable.  3.  CAD status post remote LAD stent intervention, no active angina at this time is nonischemic Myoview in 2020.  No improvements of ACS, high-sensitivity troponin I 29.  For now would continue IV  amiodarone, anticipate temporary course as her pulmonary status improves.  Heart rate control reasonable this morning, otherwise continue Cardizem CD, bisoprolol, and Eliquis.  Continue IV Lasix and track urine output with follow-up BMET in a.m.  For questions or updates, please contact [facility] Please consult [email or web address] for contact info under   Signed, Nona Dell, MD  12/04/2022, 9:40 AM

## 2022-12-04 NOTE — Plan of Care (Signed)
  Problem: Education: Goal: Knowledge of General Education information will improve Description: Including pain rating scale, medication(s)/side effects and non-pharmacologic comfort measures Outcome: Progressing   Problem: Activity: Goal: Risk for activity intolerance will decrease Outcome: Progressing   Problem: Nutrition: Goal: Adequate nutrition will be maintained Outcome: Progressing   Problem: Coping: Goal: Level of anxiety will decrease Outcome: Progressing   Problem: Elimination: Goal: Will not experience complications related to bowel motility Outcome: Progressing   Problem: Safety: Goal: Ability to remain free from injury will improve Outcome: Progressing   Problem: Skin Integrity: Goal: Risk for impaired skin integrity will decrease Outcome: Progressing   

## 2022-12-05 DIAGNOSIS — J9621 Acute and chronic respiratory failure with hypoxia: Secondary | ICD-10-CM | POA: Diagnosis not present

## 2022-12-05 DIAGNOSIS — I5033 Acute on chronic diastolic (congestive) heart failure: Secondary | ICD-10-CM | POA: Diagnosis not present

## 2022-12-05 DIAGNOSIS — I4891 Unspecified atrial fibrillation: Secondary | ICD-10-CM | POA: Diagnosis not present

## 2022-12-05 LAB — GLUCOSE, CAPILLARY
Glucose-Capillary: 245 mg/dL — ABNORMAL HIGH (ref 70–99)
Glucose-Capillary: 261 mg/dL — ABNORMAL HIGH (ref 70–99)
Glucose-Capillary: 279 mg/dL — ABNORMAL HIGH (ref 70–99)
Glucose-Capillary: 253 mg/dL — ABNORMAL HIGH (ref 70–99)
Glucose-Capillary: 221 mg/dL — ABNORMAL HIGH (ref 70–99)
Glucose-Capillary: 150 mg/dL — ABNORMAL HIGH (ref 70–99)

## 2022-12-05 LAB — BASIC METABOLIC PANEL
Anion gap: 9 (ref 5–15)
BUN: 27 mg/dL — ABNORMAL HIGH (ref 8–23)
CO2: 24 mmol/L (ref 22–32)
Calcium: 9 mg/dL (ref 8.9–10.3)
Chloride: 99 mmol/L (ref 98–111)
Creatinine, Ser: 1.17 mg/dL — ABNORMAL HIGH (ref 0.44–1.00)
GFR, Estimated: 48 mL/min — ABNORMAL LOW (ref >60)
Glucose, Bld: 293 mg/dL — ABNORMAL HIGH (ref 70–99)
Potassium: 4.7 mmol/L (ref 3.5–5.1)
Sodium: 132 mmol/L — ABNORMAL LOW (ref 135–145)

## 2022-12-05 LAB — CBC WITH DIFFERENTIAL/PLATELET
Abs Immature Granulocytes: 0.1 10*3/uL — ABNORMAL HIGH (ref 0.00–0.07)
Basophils Absolute: 0 10*3/uL (ref 0.0–0.1)
Basophils Relative: 0 %
Eosinophils Absolute: 0 10*3/uL (ref 0.0–0.5)
Eosinophils Relative: 0 %
HCT: 37.1 % (ref 36.0–46.0)
Hemoglobin: 10.9 g/dL — ABNORMAL LOW (ref 12.0–15.0)
Immature Granulocytes: 1 %
Lymphocytes Relative: 6 %
Lymphs Abs: 0.9 10*3/uL (ref 0.7–4.0)
MCH: 26.1 pg (ref 26.0–34.0)
MCHC: 29.4 g/dL — ABNORMAL LOW (ref 30.0–36.0)
MCV: 89 fL (ref 80.0–100.0)
Monocytes Absolute: 0.3 10*3/uL (ref 0.1–1.0)
Monocytes Relative: 3 %
Neutro Abs: 12.5 10*3/uL — ABNORMAL HIGH (ref 1.7–7.7)
Neutrophils Relative %: 90 %
Platelets: 188 10*3/uL (ref 150–400)
RBC: 4.17 MIL/uL (ref 3.87–5.11)
WBC: 13.9 10*3/uL — ABNORMAL HIGH (ref 4.0–10.5)
nRBC: 0 % (ref 0.0–0.2)

## 2022-12-05 LAB — MAGNESIUM: Magnesium: 1.9 mg/dL (ref 1.7–2.4)

## 2022-12-05 MED ORDER — HYDRALAZINE HCL 20 MG/ML IJ SOLN
10.0000 mg | Freq: Four times a day (QID) | INTRAMUSCULAR | Status: DC | PRN
  Administered 2022-12-05: 10 mg via INTRAVENOUS
  Filled 2022-12-05: qty 1

## 2022-12-05 MED ORDER — PREDNISONE 20 MG PO TABS
20.0000 mg | ORAL_TABLET | Freq: Two times a day (BID) | ORAL | Status: DC
  Administered 2022-12-05 – 2022-12-06 (×2): 20 mg via ORAL
  Filled 2022-12-05 (×2): qty 1

## 2022-12-05 NOTE — Inpatient Diabetes Management (Signed)
Inpatient Diabetes Program Recommendations  AACE/ADA: New Consensus Statement on Inpatient Glycemic Control   Target Ranges:  Prepandial:   less than 140 mg/dL      Peak postprandial:   less than 180 mg/dL (1-2 hours)      Critically ill patients:  140 - 180 mg/dL    Latest Reference Range & Units 12/04/22 07:27 12/04/22 11:03 12/04/22 15:38 12/04/22 21:28 12/05/22 01:08 12/05/22 02:41 12/05/22 07:29  Glucose-Capillary 70 - 99 mg/dL 782 (H) 956 (H) 213 (H) 187 (H) 245 (H) 261 (H) 279 (H)    Review of Glycemic Control  Diabetes history: DM2 Outpatient Diabetes medications: Humalog 75/25 50 units QAM, Humalog 75/25 40 units QPM, Glipizide XL 5 mg QAM Current orders for Inpatient glycemic control: Novolog 70/30 35 units BID, Novolog 0-15 units TID with meals, Novolog 0-5 units at bedtime; Solumedrol 40 mg Q12H  Inpatient Diabetes Program Recommendations:    Insulin: If steroids are continued as ordered, please consider increasing Novolog 70/30 to 40 units BID.  Thanks, Orlando Penner, RN, MSN, CDCES Diabetes Coordinator Inpatient Diabetes Program [phone] (Team Pager from 8am to 5pm)

## 2022-12-05 NOTE — Progress Notes (Signed)
PROGRESS NOTE    Ann [name]  ZOX:[id number] DOB: Dec 27, 1945 DOA: 12/03/2022 PCP: [facility] Records, FNP   Brief Narrative:  Ann [name] is a 77 year old female who is DNR with adrenal insufficiency, coronary artery disease, diastolic heart failure, OHS, morbid obesity, type 2 diabetes mellitus, GERD, hyperlipidemia, hypothyroidism, paroxysmal atrial fibrillation, severe iron deficiency anemia presented to the emergency department today by EMS with chronic hypoxic due to COPD on 2L [place]. Presents with afib/RVR and respiratory distress. Hospitalist called for admission, cardiology called in consult.  Assessment & Plan:   Principal Problem:   Acute on chronic respiratory failure with hypoxia (HCC) Active Problems:   Acquired hypothyroidism   Mixed hyperlipidemia   Essential hypertension   Coronary atherosclerosis   Uncontrolled type 2 diabetes mellitus with hyperglycemia (HCC)   Chronic diastolic heart failure (HCC)   Long term current use of anticoagulant therapy   Class 3 obesity (HCC)   Chronic kidney disease, stage III (moderate) (HCC)   Atrial fibrillation, chronic (HCC)   COPD with acute exacerbation (HCC)   Atrial fibrillation with rapid ventricular response (HCC)   COPD GOLD 0 / 02 dep / prob Cor pulmonale   Weakness   Anxiety   Acute on chronic respiratory failure with hypoxia secondary to acute COPD exacerbation -Baseline oxygen 2 L nasal cannula around-the-clock, occasionally increases during exertion  -Continue nebs, respiratory status -approaching baseline -Continue doxycycline x 5 days -Transition to p.o. steroids   Acute HFpEF, EF 60-65% resolving -Last echo April of this year no indication to repeat  -Off IV diuretics, defer to cardiology -Likely playing a role with above acute hypoxia   Atrial fibrillation, permanent, with RVR - CHA2DS2-VASc 5 -Likely provoked secondary to above hypoxia and multiple comorbidities -IV amiodarone discontinued  today(? Cause of nausea), home Cardizem and bisoprolol continued, remains on Eliquis per cardiology   Adrenal Insufficiency, chronic -Continue home prednisone once able to transition down from steroids as above   Uncontrolled type 2 diabetes mellitis with neurological complications with hyperglycemia Polyneuropathy of diabetes mellitus -Continue insulin 70/30 -35 units twice daily -Continue sliding scale insulin, hypoglycemic protocol -Likely poorly controlled with steroid taper, monitor closely -A1C 7.3  Hypothyroidism  -Continue levothyroxine   Essential hypertension  -Borderline hypotensive at intake, resume home medications as appropriate, continue bisoprolol, Cardizem, Lasix given above    Acute Urinary Retention  -Unclear etiology, In-N-Out cath x 1 in the ED with over 600 mL output -Once aggressive diuresis has improved we will transition to p.o. diuretics and remove Foley for voiding trial   Stage 3b CKD  - monitor renal function closely in the setting of IV furosemide diuresis    Diabetic Dyslipidemia -Continue home atorvastatin 80 mg daily    Severe Iron Deficiency Anemia - she is followed by Dr. Ellin Saba (hematologist) - Iron sulfate 325 mg daily   Candidal Intertrigo - fluconazole 150 mg  - topical nystatin powder application to affected areas TID    GERD - IV pantoprazole ordered for GI protection   DVT prophylaxis:  apixaban (ELIQUIS) tablet 5 mg   Code Status:   Code Status: DNR  Family Communication: None  Status is: Inpatient  Dispo: The patient is from: Home              Anticipated d/c is to: To be determined              Anticipated d/c date is: 48 to 72 hours  Patient currently not medically stable for discharge  Consultants:  Cardiology  Procedures:  None  Antimicrobials:  Doxycycline x 5 days  Subjective: Patient reports acute onset nausea around 230 this morning, minimally improved with supportive care, amiodarone to  be discontinued as above.  Patient otherwise denies headache fever chills chest pain shortness of breath.  Objective: Vitals:   12/05/22 0500 12/05/22 0600 12/05/22 0742 12/05/22 0743  BP: 127/78 (!) 156/95    Pulse: 72 68    Resp: 15 15    Temp:   (!) 97.4 F (36.3 C)   TempSrc:   Oral   SpO2: 96% 97%  98%  Weight: 130.1 kg     Height:        Intake/Output Summary (Last 24 hours) at 12/05/2022 0754 Last data filed at 12/04/2022 1351 Gross per 24 hour  Intake 572.39 ml  Output 600 ml  Net -27.61 ml   Filed Weights   12/03/22 1119 12/04/22 0411 12/05/22 0500  Weight: 126.1 kg 127.7 kg 130.1 kg    Examination:  General:  Pleasantly resting in bed, No acute distress. HEENT:  Normocephalic atraumatic.  Sclerae nonicteric, noninjected.  Extraocular movements intact bilaterally. Neck:  Without mass or deformity.  Trachea is midline. Lungs: Bibasilar rales Heart: Irregular irregular rate/rhythm.  Without murmurs, rubs, or gallops. Abdomen:  Soft, nontender, nondistended.  Without guarding or rebound. Extremities: 2+ pitting edema bilateral lower extremities. Skin:  Warm and dry, no erythema.  Data Reviewed: I have personally reviewed following labs and imaging studies  CBC: Recent Labs  Lab 12/03/22 1116 12/04/22 0625 12/05/22 0353  WBC 11.3* 11.1* 13.9*  NEUTROABS 7.2 9.8* 12.5*  HGB 11.4* 11.0* 10.9*  HCT 38.5 36.8 37.1  MCV 88.5 87.8 89.0  PLT 168 186 188   Basic Metabolic Panel: Recent Labs  Lab 12/03/22 1116 12/04/22 0514 12/05/22 0629  NA 140 136 132*  K 3.6 4.1 4.7  CL 106 102 99  CO2 25 26 24   GLUCOSE 184* 241* 293*  BUN 15 13 27*  CREATININE 0.84 0.79 1.17*  CALCIUM 8.6* 8.5* 9.0  MG  --  1.8 1.9   GFR: Estimated Creatinine Clearance: 53.1 mL/min (A) (by C-G formula based on SCr of 1.17 mg/dL (H)).  Liver Function Tests: Recent Labs  Lab 12/03/22 1116  AST 22  ALT 20  ALKPHOS 74  BILITOT 0.7  PROT 6.6  ALBUMIN 3.3*   BNP (last 3  results) Recent Labs    10/14/22 1042  PROBNP 2,295*   HbA1C: Recent Labs    12/03/22 1116  HGBA1C 7.3*   CBG: Recent Labs  Lab 12/04/22 1538 12/04/22 2128 12/05/22 0108 12/05/22 0241 12/05/22 0729  GLUCAP 307* 187* 245* 261* 279*   Sepsis Labs: Recent Labs  Lab 12/03/22 1125 12/03/22 1353  LATICACIDVEN 2.0* 2.3*    Recent Results (from the past 240 hour(s))  Blood culture (routine x 2)     Status: None (Preliminary result)   Collection Time: 12/03/22 11:16 AM   Specimen: BLOOD  Result Value Ref Range Status   Specimen Description BLOOD BLOOD LEFT HAND  Final   Special Requests   Final    BOTTLES DRAWN AEROBIC ONLY Blood Culture adequate volume   Culture   Final    NO GROWTH < 24 HOURS Performed at [facility], [address], [place], [place] [address]    Report Status PENDING  Incomplete  Blood culture (routine x 2)     Status: None (Preliminary result)  Collection Time: 12/03/22 11:46 AM   Specimen: BLOOD  Result Value Ref Range Status   Specimen Description BLOOD LEFT ANTECUBITAL  Final   Special Requests   Final    BOTTLES DRAWN AEROBIC AND ANAEROBIC Blood Culture adequate volume   Culture   Final    NO GROWTH < 24 HOURS Performed at [facility], [address], [place], [place] [address]    Report Status PENDING  Incomplete  MRSA Next Gen by PCR, Nasal     Status: None   Collection Time: 12/03/22  4:04 PM   Specimen: Nasal Mucosa; Nasal Swab  Result Value Ref Range Status   MRSA by PCR Next Gen NOT DETECTED NOT DETECTED Final    Comment: (NOTE) The GeneXpert MRSA Assay (FDA approved for NASAL specimens only), is one component of a comprehensive MRSA colonization surveillance program. It is not intended to diagnose MRSA infection nor to guide or monitor treatment for MRSA infections. Test performance is not FDA approved in patients less than 65 years old. Performed at [facility], [address], [place], [place] [address]           Radiology Studies: Portable chest 1 View  Result Date: 12/04/2022 CLINICAL DATA:  77 year old female with acute on chronic respiratory failure. Possible congestive heart failure. EXAM: PORTABLE CHEST 1 VIEW COMPARISON:  Chest x-ray 12/03/2022. FINDINGS: Lung volumes are slightly low. Persistent linear opacities in the left mid lung likely reflect subsegmental atelectasis. No acute consolidative airspace disease. No pleural effusions. No pneumothorax. Cephalization of the pulmonary vasculature, without frank pulmonary edema. Mild cardiomegaly. Upper mediastinal contours are within normal limits allowing for patient positioning. Atherosclerotic calcifications in the thoracic aorta. Orthopedic fixation hardware in the lower cervical spine incidentally noted. IMPRESSION: 1. Cardiomegaly with pulmonary venous congestion, but no frank pulmonary edema. 2. Probable subsegmental atelectasis in the left mid lung, similar to the prior study. 3. Aortic atherosclerosis. Electronically Signed   By: Trudie Reed M.D.   On: 12/04/2022 05:40   DG Chest Portable 1 View  Result Date: 12/03/2022 CLINICAL DATA:  Shortness of breath EXAM: PORTABLE CHEST 1 VIEW COMPARISON:  09/16/2022 FINDINGS: Transverse diameter of heart is increased. Linear densities in left parahilar region may suggest scarring or subsegmental atelectasis. There are no signs of alveolar pulmonary edema. No new focal pulmonary consolidation is seen. There is no significant pleural effusion or pneumothorax. There is previous surgical fusion in lower cervical spine. IMPRESSION: Cardiomegaly. Linear densities in left parahilar region suggest scarring or subsegmental atelectasis. There are no signs of alveolar pulmonary edema or focal pulmonary consolidation. Electronically Signed   By: Ernie Avena M.D.   On: 12/03/2022 12:00        Scheduled Meds:  apixaban  5 mg Oral BID   atorvastatin  80 mg Oral q1800   bisoprolol  5 mg Oral Daily    Chlorhexidine Gluconate Cloth  6 each Topical Q0600   Chlorhexidine Gluconate Cloth  6 each Topical Q0600   diltiazem  120 mg Oral Daily   ferrous sulfate  325 mg Oral QAC breakfast   furosemide  40 mg Intravenous Daily   gabapentin  400 mg Oral TID   guaiFENesin  1,200 mg Oral BID   insulin aspart  0-15 Units Subcutaneous TID WC   insulin aspart  0-5 Units Subcutaneous QHS   insulin aspart protamine- aspart  35 Units Subcutaneous BID AC   ipratropium-albuterol  3 mL Nebulization TID   levothyroxine  175 mcg Oral q morning  loratadine  10 mg Oral Daily   methylPREDNISolone (SOLU-MEDROL) injection  40 mg Intravenous Q12H   mirabegron ER  25 mg Oral Daily   mometasone-formoterol  2 puff Inhalation BID   nystatin   Topical TID   pantoprazole (PROTONIX) IV  40 mg Intravenous QHS   Continuous Infusions:  amiodarone 30 mg/hr (12/04/22 1016)   doxycycline (VIBRAMYCIN) IV 100 mg (12/05/22 0506)     LOS: 2 days    Time spent: 45 minutes    Azucena Fallen, DO [facility]  If 7PM-7AM, please contact night-coverage [email or web address]  12/05/2022, 7:54 AM

## 2022-12-05 NOTE — Progress Notes (Signed)
   Ambulatory O2 Sar    At rest with 3L via Aspers - 97%   HR-90  Ambulatory with 3L O2 - 78-80%  HR-140  Patient was able to ambulate about 20 feet each way before becoming  light-headed.

## 2022-12-05 NOTE — Progress Notes (Signed)
Pt resting comfortably at this time, bipap not indicated.

## 2022-12-05 NOTE — Plan of Care (Signed)

## 2022-12-05 NOTE — Progress Notes (Signed)
Progress Note  Patient Name: Ann [name] Date of Encounter: 12/05/2022  Primary Cardiologist: Dina Rich, MD  Interval Summary   Complains of nausea since around 2:30 AM.  No abdominal pain, no emesis.  Has had some coughing.  No chest pain or palpitations.  Vital Signs    Vitals:   12/05/22 0500 12/05/22 0600 12/05/22 0742 12/05/22 0743  BP: 127/78 (!) 156/95    Pulse: 72 68    Resp: 15 15    Temp:   (!) 97.4 F (36.3 C)   TempSrc:   Oral   SpO2: 96% 97%  98%  Weight: 130.1 kg     Height:        Intake/Output Summary (Last 24 hours) at 12/05/2022 0858 Last data filed at 12/04/2022 1351 Gross per 24 hour  Intake 332.39 ml  Output 600 ml  Net -267.61 ml   Filed Weights   12/03/22 1119 12/04/22 0411 12/05/22 0500  Weight: 126.1 kg 127.7 kg 130.1 kg    Physical Exam   GEN: No acute distress.   Neck: No JVD. Cardiac: Irregularly irregular without gallop.  Respiratory: Nonlabored.  Coarse breath sounds without wheezing. GI: Soft, nontender, bowel sounds present. MS: No edema.  ECG/Telemetry    Rate controlled atrial fibrillation with heart rate in the 80s to 90s.  Labs    Chemistry Recent Labs  Lab 12/03/22 1116 12/04/22 0514 12/05/22 0629  NA 140 136 132*  K 3.6 4.1 4.7  CL 106 102 99  CO2 25 26 24   GLUCOSE 184* 241* 293*  BUN 15 13 27*  CREATININE 0.84 0.79 1.17*  CALCIUM 8.6* 8.5* 9.0  PROT 6.6  --   --   ALBUMIN 3.3*  --   --   AST 22  --   --   ALT 20  --   --   ALKPHOS 74  --   --   BILITOT 0.7  --   --   GFRNONAA >60 >60 48*  ANIONGAP 9 8 9     Hematology Recent Labs  Lab 12/03/22 1116 12/04/22 0625 12/05/22 0353  WBC 11.3* 11.1* 13.9*  RBC 4.35 4.19 4.17  HGB 11.4* 11.0* 10.9*  HCT 38.5 36.8 37.1  MCV 88.5 87.8 89.0  MCH 26.2 26.3 26.1  MCHC 29.6* 29.9* 29.4*  RDW Not Measured Not Measured Not Measured  PLT 168 186 188   Cardiac Enzymes Recent Labs  Lab 12/03/22 1116  TROPONINIHS 29*   Lipid Panel      Component Value Date/Time   CHOL 127 10/16/2022 1533   TRIG 127 10/16/2022 1533   HDL 43 10/16/2022 1533   CHOLHDL 3.0 10/16/2022 1533   CHOLHDL 4.4 05/17/2016 0923   VLDL 36 (H) 05/17/2016 0923   LDLCALC 61 10/16/2022 1533   LABVLDL 23 10/16/2022 1533    Cardiac Studies   Echocardiogram 09/18/2022:  1. Left ventricular ejection fraction, by estimation, is 60 to 65%. The  left ventricle has normal function. The left ventricle has no regional  wall motion abnormalities. There is mild left ventricular hypertrophy.  Left ventricular diastolic function  could not be evaluated. The E/e' is 25.   2. Right ventricular systolic function is normal. The right ventricular  size is normal. Tricuspid regurgitation signal is inadequate for assessing  PA pressure.   3. Left atrial size was moderately dilated.   4. The mitral valve is normal in structure. No evidence of mitral valve  regurgitation. No evidence of mitral stenosis.  5. The aortic valve has an indeterminant number of cusps. Aortic valve  regurgitation is not visualized. No aortic stenosis is present.   6. Aortic dilatation noted. There is borderline dilatation of the aortic  root, measuring 38 mm. There is mild dilatation of the ascending aorta,  measuring 44 mm.   7. The inferior vena cava is dilated in size with >50% respiratory  variability, suggesting right atrial pressure of 8 mmHg.   Assessment & Plan   1.  Permanent atrial fibrillation with CHA2DS2-VASc score of 5.  Heart rates elevated in the setting of acute illness including COPD exacerbation and acute on chronic hypoxic respiratory failure.  Temporarily on IV amiodarone along with home doses of Cardizem CD and bisoprolol.  Anticoagulation is with Eliquis.  Due to her recent nausea, we will go ahead and stop IV amiodarone in case this is at all related and heart rate is settled down fairly well at this point.  Can adjust oral rate control medications if necessary.  2.   HFpEF with acute exacerbation.  LVEF 60 to 65% with normal RV contraction as of echocardiogram in April.  Currently on IV Lasix. Net output of approximately 1000 cc so far, intake and output are incomplete.  Creatinine has bumped up to 1.17 from 0.79.  3.  CAD status post remote LAD stent intervention, no active angina at this time is nonischemic Myoview in 2020.  No improvements of ACS, high-sensitivity troponin I 29.  Stop IV amiodarone and hold IV Lasix today.  Can adjust bisoprolol and Cardizem CD for heart rate control if necessary.  Continue Eliquis.  Recheck BMET in a.m.  For questions or updates, please contact [facility] Please consult [email or web address] for contact info under   Signed, Nona Dell, MD  12/05/2022, 8:58 AM

## 2022-12-06 DIAGNOSIS — J9621 Acute and chronic respiratory failure with hypoxia: Secondary | ICD-10-CM | POA: Diagnosis not present

## 2022-12-06 DIAGNOSIS — I4891 Unspecified atrial fibrillation: Secondary | ICD-10-CM | POA: Diagnosis not present

## 2022-12-06 LAB — CBC WITH DIFFERENTIAL/PLATELET
Abs Immature Granulocytes: 0.22 10*3/uL — ABNORMAL HIGH (ref 0.00–0.07)
Basophils Absolute: 0 10*3/uL (ref 0.0–0.1)
Basophils Relative: 0 %
Eosinophils Absolute: 0 10*3/uL (ref 0.0–0.5)
Eosinophils Relative: 0 %
HCT: 36.4 % (ref 36.0–46.0)
Hemoglobin: 10.9 g/dL — ABNORMAL LOW (ref 12.0–15.0)
Immature Granulocytes: 1 %
Lymphocytes Relative: 7 %
Lymphs Abs: 1.2 10*3/uL (ref 0.7–4.0)
MCH: 26.5 pg (ref 26.0–34.0)
MCHC: 29.9 g/dL — ABNORMAL LOW (ref 30.0–36.0)
MCV: 88.3 fL (ref 80.0–100.0)
Monocytes Absolute: 1 10*3/uL (ref 0.1–1.0)
Monocytes Relative: 6 %
Neutro Abs: 14.6 10*3/uL — ABNORMAL HIGH (ref 1.7–7.7)
Neutrophils Relative %: 86 %
Platelets: 234 10*3/uL (ref 150–400)
RBC: 4.12 MIL/uL (ref 3.87–5.11)
WBC: 17 10*3/uL — ABNORMAL HIGH (ref 4.0–10.5)
nRBC: 0 % (ref 0.0–0.2)

## 2022-12-06 LAB — BASIC METABOLIC PANEL
Anion gap: 8 (ref 5–15)
BUN: 25 mg/dL — ABNORMAL HIGH (ref 8–23)
CO2: 25 mmol/L (ref 22–32)
Calcium: 9.5 mg/dL (ref 8.9–10.3)
Chloride: 102 mmol/L (ref 98–111)
Creatinine, Ser: 0.94 mg/dL (ref 0.44–1.00)
GFR, Estimated: >60 mL/min (ref >60)
Glucose, Bld: 106 mg/dL — ABNORMAL HIGH (ref 70–99)
Potassium: 4.7 mmol/L (ref 3.5–5.1)
Sodium: 135 mmol/L (ref 135–145)

## 2022-12-06 LAB — GLUCOSE, CAPILLARY
Glucose-Capillary: 116 mg/dL — ABNORMAL HIGH (ref 70–99)
Glucose-Capillary: 101 mg/dL — ABNORMAL HIGH (ref 70–99)
Glucose-Capillary: 153 mg/dL — ABNORMAL HIGH (ref 70–99)

## 2022-12-06 LAB — CULTURE, BLOOD (ROUTINE X 2): Special Requests: ADEQUATE

## 2022-12-06 MED ORDER — FUROSEMIDE 40 MG PO TABS: 40.0000 mg | ORAL_TABLET | Freq: Two times a day (BID) | ORAL | 0 refills | Status: DC

## 2022-12-06 MED ORDER — NYSTATIN 100000 UNIT/GM EX POWD: Freq: Three times a day (TID) | CUTANEOUS | 0 refills | Status: AC

## 2022-12-06 MED ORDER — DOXYCYCLINE HYCLATE 50 MG PO CAPS: 50.0000 mg | ORAL_CAPSULE | Freq: Two times a day (BID) | ORAL | 0 refills | Status: DC

## 2022-12-06 NOTE — Progress Notes (Signed)
Nsg Discharge Note  Admit Date:  12/03/2022 Discharge date: 12/06/2022   Marvis Moeller to be D/C'd Home per MD order.  AVS completed.  Copy for chart, and copy for patient signed, and dated. Patient/caregiver able to verbalize understanding.  Discharge Medication: Allergies as of 12/06/2022       Reactions   Ace Inhibitors Other (See Comments)   Reaction unknown   Asa [aspirin] Other (See Comments)   Causes bleeding   Breztri Aerosphere [budeson-glycopyrrol-formoterol]    Tape Other (See Comments)   Skin tearing, causes scars Other Reaction(s): Other (See Comments)   Niacin Rash   Reglan [metoclopramide] Anxiety        Medication List     TAKE these medications    acetaminophen 325 MG tablet Commonly known as: TYLENOL Take 2 tablets (650 mg total) by mouth every 6 (six) hours as needed for mild pain, moderate pain or fever.   albuterol 108 (90 Base) MCG/ACT inhaler Commonly known as: VENTOLIN HFA Inhale 2 puffs into the lungs every 4 (four) hours as needed for wheezing.   apixaban 5 MG Tabs tablet Commonly known as: ELIQUIS Take 1 tablet (5 mg total) by mouth 2 (two) times daily.   atorvastatin 80 MG tablet Commonly known as: LIPITOR Take 1 tablet (80 mg total) by mouth daily at 6 PM.   bisoprolol 5 MG tablet Commonly known as: ZEBETA Take 1 tablet (5 mg total) by mouth daily.   budesonide-formoterol 80-4.5 MCG/ACT inhaler Commonly known as: Symbicort Take 2 puffs first thing in am and then another 2 puffs about 12 hours later.   Carboxymethylcellulose Sod PF 0.5 % Soln Place 2 drops into both eyes daily as needed (for dry eye relief).   cyanocobalamin 1000 MCG tablet Take 1 tablet (1,000 mcg total) by mouth daily. What changed: how much to take   diltiazem 120 MG 24 hr capsule Commonly known as: CARDIZEM CD Take 1 capsule (120 mg total) by mouth daily.   doxycycline 50 MG capsule Commonly known as: VIBRAMYCIN Take 1 capsule (50 mg total) by mouth 2  (two) times daily.   esomeprazole 40 MG capsule Commonly known as: NexIUM Take 1 capsule (40 mg total) by mouth 2 (two) times daily before a meal.   FreeStyle Libre 2 Sensor Misc Use to check glucose as directed. Change sensor every 14 days.   furosemide 40 MG tablet Commonly known as: Lasix Take 1 tablet (40 mg total) by mouth 2 (two) times daily.   gabapentin 400 MG capsule Commonly known as: NEURONTIN Take 1 capsule (400 mg total) by mouth 3 (three) times daily.   Gemtesa 75 MG Tabs Generic drug: Vibegron Take 1 tablet (75 mg total) by mouth daily.   glipiZIDE XL 5 MG 24 hr tablet Generic drug: glipiZIDE TAKE ONE TABLET BY MOUTH EVERY DAY WITH BREAKFAST What changed: See the new instructions.   Insulin Lispro Prot & Lispro (75-25) 100 UNIT/ML Kwikpen Commonly known as: HumaLOG Mix 75/25 KwikPen Inject 40-50 Units into the skin 2 (two) times daily before a meal. What changed: additional instructions   ipratropium-albuterol 0.5-2.5 (3) MG/3ML Soln Commonly known as: DUONEB Take 3 mLs by nebulization every 6 (six) hours as needed (copd).   Iron (Ferrous Sulfate) 325 (65 Fe) MG Tabs Take 325 mg by mouth daily.   levothyroxine 175 MCG tablet Commonly known as: SYNTHROID Take 1 tablet (175 mcg total) by mouth every morning.   loratadine 10 MG tablet Commonly known as: CLARITIN Take 10  mg by mouth daily.   nitroGLYCERIN 0.4 MG SL tablet Commonly known as: NITROSTAT Place 1 tablet (0.4 mg total) under the tongue every 5 (five) minutes as needed for chest pain.   nystatin powder Commonly known as: MYCOSTATIN/NYSTOP Apply topically 3 (three) times daily. Apply to to affected areas (groin/skin folds)   predniSONE 10 MG tablet Commonly known as: DELTASONE Take 1 tablet (10 mg total) by mouth daily with breakfast.   Salonpas Pain Relief Patch Ptch Apply 1 patch topically daily as needed (pain).   Trelegy Ellipta 100-62.5-25 MCG/ACT Aepb Generic drug:  Fluticasone-Umeclidin-Vilant Take 1 puff by mouth daily.   triamcinolone cream 0.1 % Commonly known as: KENALOG Apply 1 Application topically 2 (two) times daily.   Vitamin D3 125 MCG (5000 UT) Caps Take 1 capsule (5,000 Units total) by mouth daily.        Discharge Assessment: Vitals:   12/06/22 0406 12/06/22 0809  BP: 113/61   Pulse:    Resp: 20   Temp: (!) 97.5 F (36.4 C)   SpO2: 96% 98%   Skin clean, dry and intact without evidence of skin break down, no evidence of skin tears noted. IV catheter discontinued intact. Site without signs and symptoms of complications - no redness or edema noted at insertion site, patient denies c/o pain - only slight tenderness at site.  Dressing with slight pressure applied.  D/c Instructions-Education: Discharge instructions given to patient/family with verbalized understanding. D/c education completed with patient/family including follow up instructions, medication list, d/c activities limitations if indicated, with other d/c instructions as indicated by MD - patient able to verbalize understanding, all questions fully answered. Patient instructed to return to ED, call 911, or call MD for any changes in condition.  Patient escorted via WC, and D/C home via private auto.  Demetrio Lapping, LPN 1/61/0960 45:40 AM

## 2022-12-06 NOTE — Progress Notes (Signed)
Pt heart rate 49 bpm, Dr. Thomes Dinning notified, pt asymptomatic. No new orders given at this time. Pt alert and verbal, denies any pain or discomfort. Call bell in reach.

## 2022-12-06 NOTE — Plan of Care (Signed)

## 2022-12-06 NOTE — Progress Notes (Signed)
   12/06/22 0644  Provider Notification  Provider Name/Title ADEFESO, O  Date Provider Notified 12/06/22  Time Provider Notified [phone]  Method of Notification Call;Page (secure chat)  Notification Reason Change in status (2.4 second pause/ HR dropped to low 30s and 40s. pt asymptomatic)  Provider response En route;No new orders  Date of Provider Response 12/06/22  Time of Provider Response [phone]

## 2022-12-06 NOTE — Progress Notes (Signed)
Rounding Note    Patient Name: Ann [name] Date of Encounter: 12/06/2022  [facility] Cardiologist: Dina Rich, MD   Subjective   Breathing improving  Inpatient Medications    Scheduled Meds:  apixaban  5 mg Oral BID   atorvastatin  80 mg Oral q1800   bisoprolol  5 mg Oral Daily   Chlorhexidine Gluconate Cloth  6 each Topical Q0600   Chlorhexidine Gluconate Cloth  6 each Topical Q0600   diltiazem  120 mg Oral Daily   ferrous sulfate  325 mg Oral QAC breakfast   gabapentin  400 mg Oral TID   guaiFENesin  1,200 mg Oral BID   insulin aspart  0-15 Units Subcutaneous TID WC   insulin aspart  0-5 Units Subcutaneous QHS   insulin aspart protamine- aspart  35 Units Subcutaneous BID AC   ipratropium-albuterol  3 mL Nebulization TID   levothyroxine  175 mcg Oral q morning   loratadine  10 mg Oral Daily   mirabegron ER  25 mg Oral Daily   mometasone-formoterol  2 puff Inhalation BID   nystatin   Topical TID   pantoprazole (PROTONIX) IV  40 mg Intravenous QHS   predniSONE  20 mg Oral BID WC   Continuous Infusions:  doxycycline (VIBRAMYCIN) IV 100 mg (12/06/22 0601)   PRN Meds: acetaminophen **OR** acetaminophen, albuterol, bisacodyl, dextromethorphan, fentaNYL (SUBLIMAZE) injection, hydrALAZINE, nitroGLYCERIN, ondansetron **OR** ondansetron (ZOFRAN) IV, oxyCODONE   Vital Signs    Vitals:   12/05/22 1920 12/05/22 2041 12/06/22 0406 12/06/22 0500  BP:  (!) 160/91 113/61   Pulse:  66    Resp:  20 20   Temp:  98 F (36.7 C) (!) 97.5 F (36.4 C)   TempSrc:  Oral Oral   SpO2: 95% 95% 96%   Weight:    130.3 kg  Height:        Intake/Output Summary (Last 24 hours) at 12/06/2022 0903 Last data filed at 12/05/2022 1648 Gross per 24 hour  Intake 749.12 ml  Output --  Net 749.12 ml      12/06/2022    5:00 AM 12/05/2022    5:00 AM 12/04/2022    4:11 AM  Last 3 Weights  Weight (lbs) 287 lb 4.2 oz 286 lb 13.1 oz 281 lb 8.4 oz  Weight (kg) 130.3 kg  130.1 kg 127.7 kg      Telemetry    Afib rate controlled - Personally Reviewed  ECG    N/a - Personally Reviewed  Physical Exam   GEN: No acute distress.   Neck: No JVD Cardiac: irreg Respiratory: bilateral wheezing GI: Soft, nontender, non-distended  MS: No edema; No deformity. Neuro:  Nonfocal  Psych: Normal affect   Labs    High Sensitivity Troponin:   Recent Labs  Lab 12/03/22 1116  TROPONINIHS 29*     Chemistry Recent Labs  Lab 12/03/22 1116 12/04/22 0514 12/05/22 0629 12/06/22 0418  NA 140 136 132* 135  K 3.6 4.1 4.7 4.7  CL 106 102 99 102  CO2 25 26 24 25   GLUCOSE 184* 241* 293* 106*  BUN 15 13 27* 25*  CREATININE 0.84 0.79 1.17* 0.94  CALCIUM 8.6* 8.5* 9.0 9.5  MG  --  1.8 1.9  --   PROT 6.6  --   --   --   ALBUMIN 3.3*  --   --   --   AST 22  --   --   --   ALT 20  --   --   --  ALKPHOS 74  --   --   --   BILITOT 0.7  --   --   --   GFRNONAA >60 >60 48* >60  ANIONGAP 9 8 9 8     Lipids No results for input(s): "CHOL", "TRIG", "HDL", "LABVLDL", "LDLCALC", "CHOLHDL" in the last 168 hours.  Hematology Recent Labs  Lab 12/04/22 0625 12/05/22 0353 12/06/22 0418  WBC 11.1* 13.9* 17.0*  RBC 4.19 4.17 4.12  HGB 11.0* 10.9* 10.9*  HCT 36.8 37.1 36.4  MCV 87.8 89.0 88.3  MCH 26.3 26.1 26.5  MCHC 29.9* 29.4* 29.9*  RDW Not Measured Not Measured Not Measured  PLT 186 188 234   Thyroid No results for input(s): "TSH", "FREET4" in the last 168 hours.  BNP Recent Labs  Lab 12/03/22 1116  BNP 267.0*    DDimer No results for input(s): "DDIMER" in the last 168 hours.   Radiology    No results found.  Cardiac Studies     Patient Profile      Assessment & Plan    1.Permanent afib - elevated rates this admission in setting of COPD exacerbation - Temporarily on IV amiodarone along with home doses of Cardizem CD and bisoprolol.  - nausea on amio, was discontinued - remains on dilt 120mg , bisoprolol 5mg . Eliquis for stroke prevention.   - rates controlled, continue current regimen   2. COPD - per primary team   3. Acute on chronic HFpEF - 08/2022 echo: LVE 60-65%, indet diastolic, normal RV - BNP 267, CXR no acute process - elevation in Cr yesterday, IV lasix was stopped - can resume her home oral regimen at discharge lasix 40mg  bid  No further inpatient cardiology recs, we will sign off inpatient care and arrange outpatient f/u     For questions or updates, please contact [facility] Please consult [email or web address] for contact info under        Signed, Dina Rich, MD  12/06/2022, 9:03 AM

## 2022-12-06 NOTE — Discharge Summary (Signed)
Physician Discharge Summary  Ann [name] NWG:[id number] DOB: 06/17/1945 DOA: 12/03/2022  PCP: Rica Records, FNP  Admit date: 12/03/2022 Discharge date: 12/06/2022  Admitted From: Home Disposition: Home  Recommendations for Outpatient Follow-up:  Follow up with PCP in 1-2 weeks Follow-up with cardiology as scheduled  Home Health: PT Equipment/Devices: None  Discharge Condition: Stable CODE STATUS: DNR Diet recommendation: Low-salt low-fat fluid restricted diet  Brief/Interim Summary: Ann [name] is a 77 year old female who is DNR with adrenal insufficiency, coronary artery disease, diastolic heart failure, OHS, morbid obesity, type 2 diabetes mellitus, GERD, hyperlipidemia, hypothyroidism, paroxysmal atrial fibrillation, severe iron deficiency anemia presented to the emergency department today by EMS with chronic hypoxic due to COPD on 2L [place]. Presents with afib/RVR and respiratory distress. Hospitalist called for admission, cardiology called in consult.   Patient admitted as above with acute on chronic respiratory failure with hypoxia secondary to acute COPD exacerbation with concurrent acute heart failure exacerbation with provoked atrial fibrillation with RVR.  Patient's acute hypoxia resolved with treatment of COPD and heart failure.  A-fib with RVR initially requiring amiodarone drip for rate control but improved after resolution of concurrent issues.  Patient now ambulating without further hypoxia or tachycardia.  Otherwise stable and agreeable for discharge home.  Lengthy discussion about dietary complaints given patient's uncontrolled diabetes and heart failure exacerbation.  Discharge Diagnoses:  Principal Problem:   Acute on chronic respiratory failure with hypoxia (HCC) Active Problems:   Acquired hypothyroidism   Mixed hyperlipidemia   Essential hypertension   Coronary atherosclerosis   Uncontrolled type 2 diabetes mellitus with hyperglycemia (HCC)    Chronic diastolic heart failure (HCC)   Long term current use of anticoagulant therapy   Class 3 obesity (HCC)   Chronic kidney disease, stage III (moderate) (HCC)   Atrial fibrillation, chronic (HCC)   COPD with acute exacerbation (HCC)   Atrial fibrillation with rapid ventricular response (HCC)   COPD GOLD 0 / 02 dep / prob Cor pulmonale   Weakness   Anxiety  Acute on chronic respiratory failure with hypoxia secondary to acute COPD exacerbation -Baseline oxygen 2 L nasal cannula around-the-clock, occasionally increases during exertion  -Complete doxycycline and steroids, wean down to daily steroid dose as instructed   Acute HFpEF, EF 60-65% resolving -Last echo April of this year no indication to repeat  -Resume home diuretics   Atrial fibrillation, permanent, with RVR - CHA2DS2-VASc 5 -Likely provoked secondary to above hypoxia and multiple comorbidities -IV amiodarone discontinued, rates are currently controlled   Adrenal Insufficiency, chronic -Continue home prednisone once able to transition down from steroids as above   Uncontrolled type 2 diabetes mellitis with neurological complications with hyperglycemia Polyneuropathy of diabetes mellitus - Continue insulin 70/30 -35 units twice daily - A1C 7.3   Hypothyroidism  - Continue levothyroxine   Essential hypertension  - Borderline hypotensive at intake, improving, resume home medications   Acute Urinary Retention, resolved -Unclear etiology, likely secondary to aggressive diuresis - resolved   Stage 3b CKD  -Stable  Diabetic Dyslipidemia -Continue home atorvastatin 80 mg daily    Severe Iron Deficiency Anemia -Followed by Dr. Ellin Saba (hematologist) -Iron sulfate 325 mg daily   Candidal Intertrigo -Fluconazole 150 mg  -Topical nystatin powder application to affected areas TID    GERD - IV pantoprazole ordered for GI protection   Discharge Instructions   Allergies as of 12/06/2022       Reactions    Ace Inhibitors Other (See Comments)   Reaction  unknown   Asa [aspirin] Other (See Comments)   Causes bleeding   Breztri Aerosphere [budeson-glycopyrrol-formoterol]    Tape Other (See Comments)   Skin tearing, causes scars Other Reaction(s): Other (See Comments)   Niacin Rash   Reglan [metoclopramide] Anxiety        Medication List     TAKE these medications    acetaminophen 325 MG tablet Commonly known as: TYLENOL Take 2 tablets (650 mg total) by mouth every 6 (six) hours as needed for mild pain, moderate pain or fever.   albuterol 108 (90 Base) MCG/ACT inhaler Commonly known as: VENTOLIN HFA Inhale 2 puffs into the lungs every 4 (four) hours as needed for wheezing.   apixaban 5 MG Tabs tablet Commonly known as: ELIQUIS Take 1 tablet (5 mg total) by mouth 2 (two) times daily.   atorvastatin 80 MG tablet Commonly known as: LIPITOR Take 1 tablet (80 mg total) by mouth daily at 6 PM.   bisoprolol 5 MG tablet Commonly known as: ZEBETA Take 1 tablet (5 mg total) by mouth daily.   budesonide-formoterol 80-4.5 MCG/ACT inhaler Commonly known as: Symbicort Take 2 puffs first thing in am and then another 2 puffs about 12 hours later.   Carboxymethylcellulose Sod PF 0.5 % Soln Place 2 drops into both eyes daily as needed (for dry eye relief).   cyanocobalamin 1000 MCG tablet Take 1 tablet (1,000 mcg total) by mouth daily. What changed: how much to take   diltiazem 120 MG 24 hr capsule Commonly known as: CARDIZEM CD Take 1 capsule (120 mg total) by mouth daily.   doxycycline 50 MG capsule Commonly known as: VIBRAMYCIN Take 1 capsule (50 mg total) by mouth 2 (two) times daily.   esomeprazole 40 MG capsule Commonly known as: NexIUM Take 1 capsule (40 mg total) by mouth 2 (two) times daily before a meal.   FreeStyle Libre 2 Sensor Misc Use to check glucose as directed. Change sensor every 14 days.   furosemide 40 MG tablet Commonly known as: Lasix Take 1 tablet (40  mg total) by mouth 2 (two) times daily.   gabapentin 400 MG capsule Commonly known as: NEURONTIN Take 1 capsule (400 mg total) by mouth 3 (three) times daily.   Gemtesa 75 MG Tabs Generic drug: Vibegron Take 1 tablet (75 mg total) by mouth daily.   glipiZIDE XL 5 MG 24 hr tablet Generic drug: glipiZIDE TAKE ONE TABLET BY MOUTH EVERY DAY WITH BREAKFAST What changed: See the new instructions.   Insulin Lispro Prot & Lispro (75-25) 100 UNIT/ML Kwikpen Commonly known as: HumaLOG Mix 75/25 KwikPen Inject 40-50 Units into the skin 2 (two) times daily before a meal. What changed: additional instructions   ipratropium-albuterol 0.5-2.5 (3) MG/3ML Soln Commonly known as: DUONEB Take 3 mLs by nebulization every 6 (six) hours as needed (copd).   Iron (Ferrous Sulfate) 325 (65 Fe) MG Tabs Take 325 mg by mouth daily.   levothyroxine 175 MCG tablet Commonly known as: SYNTHROID Take 1 tablet (175 mcg total) by mouth every morning.   loratadine 10 MG tablet Commonly known as: CLARITIN Take 10 mg by mouth daily.   nitroGLYCERIN 0.4 MG SL tablet Commonly known as: NITROSTAT Place 1 tablet (0.4 mg total) under the tongue every 5 (five) minutes as needed for chest pain.   nystatin powder Commonly known as: MYCOSTATIN/NYSTOP Apply topically 3 (three) times daily. Apply to to affected areas (groin/skin folds)   predniSONE 10 MG tablet Commonly known as: DELTASONE Take 1  tablet (10 mg total) by mouth daily with breakfast.   Salonpas Pain Relief Patch Ptch Apply 1 patch topically daily as needed (pain).   Trelegy Ellipta 100-62.5-25 MCG/ACT Aepb Generic drug: Fluticasone-Umeclidin-Vilant Take 1 puff by mouth daily.   triamcinolone cream 0.1 % Commonly known as: KENALOG Apply 1 Application topically 2 (two) times daily.   Vitamin D3 125 MCG (5000 UT) Caps Take 1 capsule (5,000 Units total) by mouth daily.        Follow-up Information     Sharlene Dory, NP Follow up on  12/20/2022.   Specialty: Cardiology Why: Keep scheduled follow-up for 12/20/2022 at 1:00 PM. Contact information: [address] [place] [address] [phone]                Allergies  Allergen Reactions   Ace Inhibitors Other (See Comments)    Reaction unknown   Asa [Aspirin] Other (See Comments)    Causes bleeding   Breztri Aerosphere [Budeson-Glycopyrrol-Formoterol]    Tape Other (See Comments)    Skin tearing, causes scars  Other Reaction(s): Other (See Comments)   Niacin Rash   Reglan [Metoclopramide] Anxiety    Consultations: None   Procedures/Studies: Portable chest 1 View  Result Date: 12/04/2022 CLINICAL DATA:  77 year old female with acute on chronic respiratory failure. Possible congestive heart failure. EXAM: PORTABLE CHEST 1 VIEW COMPARISON:  Chest x-ray 12/03/2022. FINDINGS: Lung volumes are slightly low. Persistent linear opacities in the left mid lung likely reflect subsegmental atelectasis. No acute consolidative airspace disease. No pleural effusions. No pneumothorax. Cephalization of the pulmonary vasculature, without frank pulmonary edema. Mild cardiomegaly. Upper mediastinal contours are within normal limits allowing for patient positioning. Atherosclerotic calcifications in the thoracic aorta. Orthopedic fixation hardware in the lower cervical spine incidentally noted. IMPRESSION: 1. Cardiomegaly with pulmonary venous congestion, but no frank pulmonary edema. 2. Probable subsegmental atelectasis in the left mid lung, similar to the prior study. 3. Aortic atherosclerosis. Electronically Signed   By: Trudie Reed M.D.   On: 12/04/2022 05:40   DG Chest Portable 1 View  Result Date: 12/03/2022 CLINICAL DATA:  Shortness of breath EXAM: PORTABLE CHEST 1 VIEW COMPARISON:  09/16/2022 FINDINGS: Transverse diameter of heart is increased. Linear densities in left parahilar region may suggest scarring or subsegmental atelectasis. There are no signs of  alveolar pulmonary edema. No new focal pulmonary consolidation is seen. There is no significant pleural effusion or pneumothorax. There is previous surgical fusion in lower cervical spine. IMPRESSION: Cardiomegaly. Linear densities in left parahilar region suggest scarring or subsegmental atelectasis. There are no signs of alveolar pulmonary edema or focal pulmonary consolidation. Electronically Signed   By: Ernie Avena M.D.   On: 12/03/2022 12:00     Subjective: No acute issues/events overnight   Discharge Exam: Vitals:   12/06/22 0406 12/06/22 0809  BP: 113/61   Pulse:    Resp: 20   Temp: (!) 97.5 F (36.4 C)   SpO2: 96% 98%   Vitals:   12/05/22 2041 12/06/22 0406 12/06/22 0500 12/06/22 0809  BP: (!) 160/91 113/61    Pulse: 66     Resp: 20 20    Temp: 98 F (36.7 C) (!) 97.5 F (36.4 C)    TempSrc: Oral Oral    SpO2: 95% 96%  98%  Weight:   130.3 kg   Height:        General: Pt is alert, awake, not in acute distress Cardiovascular: RRR, S1/S2 +, no rubs, no gallops Respiratory:  CTA bilaterally, no wheezing, no rhonchi Abdominal: Soft, NT, ND, bowel sounds + Extremities: no edema, no cyanosis    The results of significant diagnostics from this hospitalization (including imaging, microbiology, ancillary and laboratory) are listed below for reference.     Microbiology: Recent Results (from the past 240 hour(s))  Blood culture (routine x 2)     Status: None (Preliminary result)   Collection Time: 12/03/22 11:16 AM   Specimen: BLOOD  Result Value Ref Range Status   Specimen Description BLOOD BLOOD LEFT HAND  Final   Special Requests   Final    BOTTLES DRAWN AEROBIC ONLY Blood Culture adequate volume   Culture   Final    NO GROWTH 3 DAYS Performed at [facility], [address], [place], [place] [address]    Report Status PENDING  Incomplete  Blood culture (routine x 2)     Status: None (Preliminary result)   Collection Time: 12/03/22 11:46 AM   Specimen:  BLOOD  Result Value Ref Range Status   Specimen Description BLOOD LEFT ANTECUBITAL  Final   Special Requests   Final    BOTTLES DRAWN AEROBIC AND ANAEROBIC Blood Culture adequate volume   Culture   Final    NO GROWTH 3 DAYS Performed at [facility], [address], [place], [place] [address]    Report Status PENDING  Incomplete  MRSA Next Gen by PCR, Nasal     Status: None   Collection Time: 12/03/22  4:04 PM   Specimen: Nasal Mucosa; Nasal Swab  Result Value Ref Range Status   MRSA by PCR Next Gen NOT DETECTED NOT DETECTED Final    Comment: (NOTE) The GeneXpert MRSA Assay (FDA approved for NASAL specimens only), is one component of a comprehensive MRSA colonization surveillance program. It is not intended to diagnose MRSA infection nor to guide or monitor treatment for MRSA infections. Test performance is not FDA approved in patients less than 49 years old. Performed at [facility], [address] [place], [place] [address]      Labs: BNP (last 3 results) Recent Labs    09/16/22 1614 10/16/22 2137 12/03/22 1116  BNP 263.0* 371.0* 267.0*   Basic Metabolic Panel: Recent Labs  Lab 12/03/22 1116 12/04/22 0514 12/05/22 0629 12/06/22 0418  NA 140 136 132* 135  K 3.6 4.1 4.7 4.7  CL 106 102 99 102  CO2 25 26 24 25   GLUCOSE 184* 241* 293* 106*  BUN 15 13 27* 25*  CREATININE 0.84 0.79 1.17* 0.94  CALCIUM 8.6* 8.5* 9.0 9.5  MG  --  1.8 1.9  --    Liver Function Tests: Recent Labs  Lab 12/03/22 1116  AST 22  ALT 20  ALKPHOS 74  BILITOT 0.7  PROT 6.6  ALBUMIN 3.3*   CBC: Recent Labs  Lab 12/03/22 1116 12/04/22 0625 12/05/22 0353 12/06/22 0418  WBC 11.3* 11.1* 13.9* 17.0*  NEUTROABS 7.2 9.8* 12.5* 14.6*  HGB 11.4* 11.0* 10.9* 10.9*  HCT 38.5 36.8 37.1 36.4  MCV 88.5 87.8 89.0 88.3  PLT 168 186 188 234   CBG: Recent Labs  Lab 12/05/22 1556 12/05/22 2103 12/06/22 0405 12/06/22 0730 12/06/22 1104  GLUCAP 221* 150* 116* 101* 153*   Urinalysis     Component Value Date/Time   COLORURINE STRAW (A) 10/17/2022 0005   APPEARANCEUR CLEAR 10/17/2022 0005   APPEARANCEUR Cloudy (A) 08/19/2022 1413   LABSPEC 1.009 10/17/2022 0005   PHURINE 6.0 10/17/2022 0005   GLUCOSEU NEGATIVE 10/17/2022 0005  HGBUR NEGATIVE 10/17/2022 0005   BILIRUBINUR NEGATIVE 10/17/2022 0005   BILIRUBINUR Negative 08/19/2022 1413   KETONESUR NEGATIVE 10/17/2022 0005   PROTEINUR NEGATIVE 10/17/2022 0005   UROBILINOGEN 0.2 03/15/2013 1504   NITRITE NEGATIVE 10/17/2022 0005   LEUKOCYTESUR NEGATIVE 10/17/2022 0005   Sepsis Labs Recent Labs  Lab 12/03/22 1116 12/04/22 0625 12/05/22 0353 12/06/22 0418  WBC 11.3* 11.1* 13.9* 17.0*   Microbiology Recent Results (from the past 240 hour(s))  Blood culture (routine x 2)     Status: None (Preliminary result)   Collection Time: 12/03/22 11:16 AM   Specimen: BLOOD  Result Value Ref Range Status   Specimen Description BLOOD BLOOD LEFT HAND  Final   Special Requests   Final    BOTTLES DRAWN AEROBIC ONLY Blood Culture adequate volume   Culture   Final    NO GROWTH 3 DAYS Performed at [facility], [address], [place], [place] [address]    Report Status PENDING  Incomplete  Blood culture (routine x 2)     Status: None (Preliminary result)   Collection Time: 12/03/22 11:46 AM   Specimen: BLOOD  Result Value Ref Range Status   Specimen Description BLOOD LEFT ANTECUBITAL  Final   Special Requests   Final    BOTTLES DRAWN AEROBIC AND ANAEROBIC Blood Culture adequate volume   Culture   Final    NO GROWTH 3 DAYS Performed at [facility], [address] [place], [place] [address]    Report Status PENDING  Incomplete  MRSA Next Gen by PCR, Nasal     Status: None   Collection Time: 12/03/22  4:04 PM   Specimen: Nasal Mucosa; Nasal Swab  Result Value Ref Range Status   MRSA by PCR Next Gen NOT DETECTED NOT DETECTED Final    Comment: (NOTE) The GeneXpert MRSA Assay (FDA approved for NASAL specimens only), is  one component of a comprehensive MRSA colonization surveillance program. It is not intended to diagnose MRSA infection nor to guide or monitor treatment for MRSA infections. Test performance is not FDA approved in patients less than 30 years old. Performed at [facility], [address], [place], [place] [address]      Time coordinating discharge: Over 30 minutes  SIGNED:   Azucena Fallen, DO [facility] 12/06/2022, 3:16 PM Pager   If 7PM-7AM, please contact night-coverage [email or web address]

## 2022-12-07 LAB — CULTURE, BLOOD (ROUTINE X 2): Culture: NO GROWTH

## 2022-12-08 LAB — CULTURE, BLOOD (ROUTINE X 2)
Culture: NO GROWTH
Special Requests: ADEQUATE
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
## 2022-12-09 ENCOUNTER — Ambulatory Visit (INDEPENDENT_AMBULATORY_CARE_PROVIDER_SITE_OTHER): Payer: Medicare HMO | Admitting: Internal Medicine

## 2022-12-09 ENCOUNTER — Encounter: Payer: Self-pay | Admitting: Internal Medicine

## 2022-12-09 VITALS — BP 137/79 | HR 65

## 2022-12-09 DIAGNOSIS — J449 Chronic obstructive pulmonary disease, unspecified: Secondary | ICD-10-CM

## 2022-12-09 DIAGNOSIS — J9611 Chronic respiratory failure with hypoxia: Secondary | ICD-10-CM | POA: Diagnosis not present

## 2022-12-09 NOTE — Patient Instructions (Addendum)
Make sure you check your oxygen saturation  AT  your highest level of activity (not after you stop)   to be sure it stays over 90% and adjust  02 flow upward to maintain this level if needed but remember to turn it back to previous settings when you stop (to conserve your supply).   Let me know if mucus stays green after you finish your doxy   Please schedule a follow up visit in 3 months but call sooner if needed

## 2022-12-10 NOTE — Assessment & Plan Note (Signed)
As of 04/29/2022 using 02 2lpm and prn  - 05/23/22  ONO on "LPM" desats only in first hour so no change other than to wear it properly q HS   Again advised: Make sure you check your oxygen saturation  AT  your highest level of activity (not after you stop)   to be sure it stays over 90% and adjust  02 flow upward to maintain this level if needed but remember to turn it back to previous settings when you stop (to conserve your supply).   F/u 3 m, sooner prn if mucus does not turn back to clear p rx doxy complete         Each maintenance medication was reviewed in detail including emphasizing most importantly the difference between maintenance and prns and under what circumstances the prns are to be triggered using an action plan format where appropriate.  Total time for H and P, chart review, counseling, reviewing dpi/hfa/neb/02 device(s) and generating customized AVS unique to this office visit / same day charting = 20 min

## 2022-12-10 NOTE — Assessment & Plan Note (Signed)
Quit smoking 1980 then gained over a hundred pounds - PFT's  12/04/16  FEV1 1.91 (85 % ) ratio 0.79  p 13 % improvement from saba p 0 prior to study with DLCO  12.14 (50%)   and FV curve min concave  and ERV 32% at wt 174    - 04/29/2022  After extensive coaching inhaler device,  effectiveness =    75% hfa  try change trelegy to [employer] and approp saba    Group D (now reclassified as E) in terms of symptom/risk and laba/lama/ICS  therefore appropriate rx at this point >>>  trelegy 100 approp but needs more approp saba  Re SABA :  I spent extra time with pt today reviewing appropriate use of albuterol for prn use on exertion with the following points: 1) saba is for relief of sob that does not improve by walking a slower pace or resting but rather if the pt does not improve after trying this first. 2) If the pt is convinced, as many are, that saba helps recover from activity faster then it's easy to tell if this is the case by re-challenging : ie stop, take the inhaler, then p 5 minutes try the exact same activity (intensity of workload) that just caused the symptoms and see if they are substantially diminished or not after saba 3) if there is an activity that reproducibly causes the symptoms, try the saba 15 min before the activity on alternate days   If in fact the saba really does help, then fine to continue to use it prn but advised may need to look closer at the maintenance regimen being used to achieve better control of airways disease with exertion.

## 2022-12-11 DIAGNOSIS — M6281 Muscle weakness (generalized): Secondary | ICD-10-CM | POA: Diagnosis not present

## 2022-12-11 DIAGNOSIS — J449 Chronic obstructive pulmonary disease, unspecified: Secondary | ICD-10-CM | POA: Diagnosis not present

## 2022-12-16 ENCOUNTER — Other Ambulatory Visit (HOSPITAL_COMMUNITY): Payer: Self-pay | Admitting: Gerontology

## 2022-12-16 ENCOUNTER — Ambulatory Visit (HOSPITAL_COMMUNITY)
Admission: RE | Admit: 2022-12-16 | Discharge: 2022-12-16 | Disposition: A | Discharge status: Home / Self Care | Payer: Medicare HMO | Source: Ambulatory Visit | Attending: Gerontology | Admitting: Gerontology

## 2022-12-16 DIAGNOSIS — N390 Urinary tract infection, site not specified: Secondary | ICD-10-CM | POA: Diagnosis not present

## 2022-12-16 DIAGNOSIS — R059 Cough, unspecified: Secondary | ICD-10-CM | POA: Insufficient documentation

## 2022-12-16 DIAGNOSIS — I5031 Acute diastolic (congestive) heart failure: Secondary | ICD-10-CM | POA: Diagnosis not present

## 2022-12-16 DIAGNOSIS — I700 Atherosclerosis of aorta: Secondary | ICD-10-CM | POA: Diagnosis not present

## 2022-12-16 DIAGNOSIS — E1165 Type 2 diabetes mellitus with hyperglycemia: Secondary | ICD-10-CM | POA: Diagnosis not present

## 2022-12-16 DIAGNOSIS — N1832 Chronic kidney disease, stage 3b: Secondary | ICD-10-CM | POA: Diagnosis not present

## 2022-12-16 DIAGNOSIS — I4891 Unspecified atrial fibrillation: Secondary | ICD-10-CM | POA: Diagnosis not present

## 2022-12-16 DIAGNOSIS — R0989 Other specified symptoms and signs involving the circulatory and respiratory systems: Secondary | ICD-10-CM | POA: Diagnosis not present

## 2022-12-16 DIAGNOSIS — I509 Heart failure, unspecified: Secondary | ICD-10-CM | POA: Diagnosis not present

## 2022-12-16 DIAGNOSIS — J9621 Acute and chronic respiratory failure with hypoxia: Secondary | ICD-10-CM | POA: Diagnosis not present

## 2022-12-17 DIAGNOSIS — E1165 Type 2 diabetes mellitus with hyperglycemia: Secondary | ICD-10-CM | POA: Diagnosis not present

## 2022-12-17 DIAGNOSIS — N390 Urinary tract infection, site not specified: Secondary | ICD-10-CM | POA: Diagnosis not present

## 2022-12-17 DIAGNOSIS — I509 Heart failure, unspecified: Secondary | ICD-10-CM | POA: Diagnosis not present

## 2022-12-20 ENCOUNTER — Encounter: Payer: Self-pay | Admitting: Nurse Practitioner

## 2022-12-20 ENCOUNTER — Ambulatory Visit: Payer: Medicare HMO | Attending: Nurse Practitioner | Admitting: Nurse Practitioner

## 2022-12-20 VITALS — BP 138/86 | HR 82 | Ht 63.0 in | Wt 277.0 lb

## 2022-12-20 DIAGNOSIS — R002 Palpitations: Secondary | ICD-10-CM | POA: Diagnosis not present

## 2022-12-20 DIAGNOSIS — I2510 Atherosclerotic heart disease of native coronary artery without angina pectoris: Secondary | ICD-10-CM

## 2022-12-20 DIAGNOSIS — I10 Essential (primary) hypertension: Secondary | ICD-10-CM | POA: Diagnosis not present

## 2022-12-20 DIAGNOSIS — J069 Acute upper respiratory infection, unspecified: Secondary | ICD-10-CM

## 2022-12-20 DIAGNOSIS — I480 Paroxysmal atrial fibrillation: Secondary | ICD-10-CM | POA: Diagnosis not present

## 2022-12-20 DIAGNOSIS — E785 Hyperlipidemia, unspecified: Secondary | ICD-10-CM

## 2022-12-20 DIAGNOSIS — D509 Iron deficiency anemia, unspecified: Secondary | ICD-10-CM | POA: Diagnosis not present

## 2022-12-20 DIAGNOSIS — J449 Chronic obstructive pulmonary disease, unspecified: Secondary | ICD-10-CM

## 2022-12-20 DIAGNOSIS — I5032 Chronic diastolic (congestive) heart failure: Secondary | ICD-10-CM | POA: Diagnosis not present

## 2022-12-20 DIAGNOSIS — I872 Venous insufficiency (chronic) (peripheral): Secondary | ICD-10-CM | POA: Diagnosis not present

## 2022-12-20 MED ORDER — ATORVASTATIN CALCIUM 80 MG PO TABS: 80.0000 mg | ORAL_TABLET | Freq: Every day | ORAL | 1 refills | Status: DC

## 2022-12-20 MED ORDER — BISOPROLOL FUMARATE 5 MG PO TABS: 5.0000 mg | ORAL_TABLET | Freq: Every day | ORAL | 2 refills | Status: DC

## 2022-12-20 MED ORDER — DILTIAZEM HCL 30 MG PO TABS: 30.0000 mg | ORAL_TABLET | Freq: Two times a day (BID) | ORAL | 3 refills | Status: DC | PRN

## 2022-12-20 MED ORDER — FUROSEMIDE 40 MG PO TABS: 40.0000 mg | ORAL_TABLET | Freq: Two times a day (BID) | ORAL | 3 refills | Status: DC

## 2022-12-20 MED ORDER — NITROGLYCERIN 0.4 MG SL SUBL: 0.4000 mg | SUBLINGUAL_TABLET | SUBLINGUAL | 3 refills | Status: DC | PRN

## 2022-12-20 MED ORDER — DILTIAZEM HCL ER COATED BEADS 120 MG PO CP24: 120.0000 mg | ORAL_CAPSULE | Freq: Every day | ORAL | 4 refills | Status: DC

## 2022-12-20 MED ORDER — APIXABAN 5 MG PO TABS: 5.0000 mg | ORAL_TABLET | Freq: Two times a day (BID) | ORAL | 3 refills | Status: DC

## 2022-12-20 NOTE — Progress Notes (Unsigned)
Office Visit    Patient Name: Ann [name] Date of Encounter: 12/20/2022 PCP:  Rica Records, FNP [facility]  Cardiologist:  Dina Rich, MD  Advanced Practice Provider:  Sharlene Dory, NP Electrophysiologist:  None   Chief Complaint and HPI    Ann [name] is a 77 y.o. female with a hx of CAD, s/p past stenting to LAD, chronic diastolic CHF, hypertension, hyperlipidemia, IDA, hx of recurrent cellulitis, leg edema, PAF, and COPD (wears 2 L of O2 at baseline) and follows Pulmonology, adrenal insufficiency, T2DM, GERD, hypothyroidism, CKD stage 3b, severe IDA, who presents today for 2-3 month follow-up.  NST in 2017 and 2022 were negative for ischemia.  Admitted 09/16/22 - 09/23/22 CHF exacerbation. TTE EF 60 to 65%, mild LVH, borderline dilatation of aortic root measuring 38 mm, mild dilatation of ascending aorta measuring 44 mm.  Received IV Lasix, transition to p.o. Lasix. Received IV ABX for LLE cellulitis. Minimal troponin elevation with flat trend, no CP.  Hospital course complicated by A-fib with RVR in the setting of acute illness.  Meds adjusted for rate control. Short admission 09/2022 at [facility] due to hypotension. Noted orthostatic lightheadedness, dyspnea was stable.  Denied CP.  Orthostatics negative.  Diltiazem was decreased to 120 mg daily.  Recommended to consider midodrine for future use if needed.  I last saw her on Oct 18, 2022 for follow-up. Continued to feel lightheaded. Cellulitis of left lower leg improved.    Admission 11/2022, presented with A-fib with RVR and respiratory distress, found ot have acute COPD exacerbation with CHF exacerbation and provoked A-fib with RVR. Required Amio drip.   Today she presents for 2-3 month follow-up. Currently reports URI symptoms including congestion, cough, and palpitations noticed with shortness of breath. Says she is seeing a different PCP (currently seeing Dr. Felecia Shelling and team managing  this). Says she will be following up soon with this office and will be due for repeat CXR. Denies any chest pain, syncope, presyncope, dizziness, orthopnea, PND, swelling or significant weight changes, acute bleeding, or claudication.  EKGs/Labs/Other Studies Reviewed:   The following studies were reviewed today:   EKG:  EKG is not ordered today.    Echo 09/18/2022: 1. Left ventricular ejection fraction, by estimation, is 60 to 65%. The  left ventricle has normal function. The left ventricle has no regional  wall motion abnormalities. There is mild left ventricular hypertrophy.  Left ventricular diastolic function  could not be evaluated. The E/e' is 25.   2. Right ventricular systolic function is normal. The right ventricular  size is normal. Tricuspid regurgitation signal is inadequate for assessing  PA pressure.   3. Left atrial size was moderately dilated.   4. The mitral valve is normal in structure. No evidence of mitral valve  regurgitation. No evidence of mitral stenosis.   5. The aortic valve has an indeterminant number of cusps. Aortic valve  regurgitation is not visualized. No aortic stenosis is present.   6. Aortic dilatation noted. There is borderline dilatation of the aortic  root, measuring 38 mm. There is mild dilatation of the ascending aorta,  measuring 44 mm.   7. The inferior vena cava is dilated in size with >50% respiratory  variability, suggesting right atrial pressure of 8 mmHg.   Comparison(s): No significant change from prior study.  Myoview 10/2020: Lexiscan stress is electrically negative for ischemia Myoview scan shows minimal thinning of apex otherwise normal perfusion LVEF on gating calculated at  59%with normal wall motion Low risk scan   Echocardiogram 12/2019: 1. Left ventricular ejection fraction, by estimation, is 60 to 65%. The  left ventricle has normal function. The left ventricle has no regional  wall motion abnormalities. There is moderate  left ventricular hypertrophy.  Left ventricular diastolic  parameters are indeterminate.   2. Right ventricular systolic function is normal. The right ventricular  size is normal.   3. Left atrial size was mildly dilated.   4. The mitral valve is normal in structure. No evidence of mitral valve  regurgitation. No evidence of mitral stenosis.   5. The aortic valve has an indeterminant number of cusps. Aortic valve  regurgitation is not visualized. No aortic stenosis is present.   6. The inferior vena cava is normal in size with greater than 50%  respiratory variability, suggesting right atrial pressure of 3 mmHg.  Risk Assessment/Calculations:   CHA2DS2-VASc Score = 5  This indicates a 7.2% annual risk of stroke. The patient's score is based upon: CHF History: 1 HTN History: 1 Diabetes History: 0 Stroke History: 0 Vascular Disease History: 0 Age Score: 2 Gender Score: 1  Review of Systems    All other systems reviewed and are otherwise negative except as noted above.  Physical Exam    VS:  BP 138/86   Pulse 82   Ht 5\' 3"  (1.6 m)   Wt 277 lb (125.6 kg)   SpO2 96%   BMI 49.07 kg/m  , BMI Body mass index is 49.07 kg/m.  Wt Readings from Last 3 Encounters:  12/20/22 277 lb (125.6 kg)  12/06/22 287 lb 4.2 oz (130.3 kg)  11/07/22 278 lb (126.1 kg)     GEN: Morbidly obese, 77 y.o. female in no acute distress, with chronic O2, in no acute distress.  HEENT: normal. Neck: Supple, no JVD, carotid bruits, or masses. Cardiac: S1/S2, irregular rhythm and regular rate, no murmurs, rubs, or gallops. No clubbing, cyanosis.  Trace nonpitting edema along BLE.Radials 2+/PT 1+ and equal bilaterally.  Respiratory:  Respirations regular and unlabored, scattered wheezing and rhonchi bilaterally to auscultation, strong and productive cough.  GI: Soft, nontender, nondistended. Skin: Warm and dry, no rash.  Mild redness noted to left lower extremity - improved, right pinky toe is red.   Neuro:  Strength and sensation are intact. Psych: Normal affect.  Assessment & Plan    Chronic diastolic CHF Etiology unclear. Stage C, NYHA class II-III. Echo 08/2022 revealed EF 60-65%. Wt is stable, current URI symptoms - see below. Improved leg swelling (see #7 below).   Continue bisoprolol and Lasix. Not a SGLT2i candidate or ACE/ARB/ARNI candidate - see allergy list. Low sodium diet, fluid restriction <2L, and daily weights encouraged. Educated to contact our office for weight gain of 2 lbs overnight or 5 lbs in one week. At next visit, consider HF clinic referral due to multiple hospital readmissions.   CAD Stable with no anginal symptoms. No indication for ischemic evaluation. Myoview in 2022 low risk. No indication for ischemic evaluation at this time. Continue bisoprolol and NTG PRN. Will refill NTG for her. Heart healthy diet recommended. ED precautions discussed.   HTN BP stable. No medication changes at this time. Discussed to monitor BP at home at least 2 hours after medications and sitting for 5-10 minutes. Low salt, heart healthy diet encouraged.   HLD LDL 47 earlier this year. Continue atorvastatin. Heart healthy diet encouraged.   PAF, palpitations  HR well controlled today, however admits to palpitations  noticed with shortness of breath. Will prescribe Diltiazem 30 mg BID PRN for HR > 120 bpm. Continue Eliquis, bisoprolol, and reduced dosing of Diltiazem, will provide refills per her request. Currently on appropriate dosage of Eliquis. Denies any bleeding issues.   COPD, URI Currently has URI symptoms. Adventitious breath sounds noted on exam. Recently saw Pulmonology and currently on PO ABX managed by PCP. No medication changes. Follow-up with Dr. Sherene Sires as scheduled and PCP.   Venous stasis dermatitis of left lower extremity Chronic, improved with redness noted to right pinky toe. Not cellulitis. Continue to follow-up with dermatology as scheduled.   Iron deficiency  anemia Stable Hgb. Encouraged her to follow-up with PCP. Continue iron supplement. Unable to obtain orthostatics today. Care precautions and ED precautions discussed.   Disposition: Will provide refills per her request. Follow up in 2-3 months with Dina Rich, MD or APP.  Signed, Sharlene Dory, NP 12/22/2022, 7:28 PM [facility]

## 2022-12-20 NOTE — Patient Instructions (Addendum)
Medication Instructions:  Your physician has recommended you make the following change in your medication:  Start taking Diltiazem 30 Mg twice daily as needed if heart rate is greater than 120.  Continue all other medications as prescribed   Labwork: None  Testing/Procedures: None  Follow-Up: Your physician recommends that you schedule a follow-up appointment in: 3 Months with Philis Nettle   Any Other Special Instructions Will Be Listed Below (If Applicable).  If you need a refill on your cardiac medications before your next appointment, please call your pharmacy.

## 2022-12-25 ENCOUNTER — Ambulatory Visit
Admission: EM | Admit: 2022-12-25 | Discharge: 2022-12-25 | Disposition: A | Discharge status: Home / Self Care | Payer: Medicare HMO | Attending: Family Medicine | Admitting: Family Medicine

## 2022-12-25 DIAGNOSIS — R5383 Other fatigue: Secondary | ICD-10-CM | POA: Diagnosis not present

## 2022-12-25 DIAGNOSIS — J0100 Acute maxillary sinusitis, unspecified: Secondary | ICD-10-CM

## 2022-12-25 DIAGNOSIS — J441 Chronic obstructive pulmonary disease with (acute) exacerbation: Secondary | ICD-10-CM

## 2022-12-25 DIAGNOSIS — R0602 Shortness of breath: Secondary | ICD-10-CM

## 2022-12-25 MED ORDER — AMOXICILLIN-POT CLAVULANATE 875-125 MG PO TABS: 1.0000 | ORAL_TABLET | Freq: Two times a day (BID) | ORAL | 0 refills | Status: DC

## 2022-12-25 MED ORDER — GUAIFENESIN ER 600 MG PO TB12: 600.0000 mg | ORAL_TABLET | Freq: Two times a day (BID) | ORAL | 0 refills | Status: DC | PRN

## 2022-12-25 MED ORDER — BENZONATATE 200 MG PO CAPS: 200.0000 mg | ORAL_CAPSULE | Freq: Three times a day (TID) | ORAL | 0 refills | Status: DC | PRN

## 2022-12-25 NOTE — ED Triage Notes (Signed)
Pt reports she is weak, sweaty, nasal congestion, no energy x 3 weeks.
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
## 2022-12-26 DIAGNOSIS — I4891 Unspecified atrial fibrillation: Secondary | ICD-10-CM | POA: Diagnosis not present

## 2022-12-26 DIAGNOSIS — I5033 Acute on chronic diastolic (congestive) heart failure: Secondary | ICD-10-CM | POA: Diagnosis not present

## 2022-12-26 DIAGNOSIS — J9601 Acute respiratory failure with hypoxia: Secondary | ICD-10-CM | POA: Diagnosis not present

## 2022-12-30 ENCOUNTER — Other Ambulatory Visit: Payer: Self-pay | Admitting: *Deleted

## 2022-12-30 ENCOUNTER — Inpatient Hospital Stay: Payer: Medicare HMO | Attending: Hematology

## 2022-12-30 DIAGNOSIS — Z87891 Personal history of nicotine dependence: Secondary | ICD-10-CM | POA: Diagnosis not present

## 2022-12-30 DIAGNOSIS — D500 Iron deficiency anemia secondary to blood loss (chronic): Secondary | ICD-10-CM

## 2022-12-30 DIAGNOSIS — N189 Chronic kidney disease, unspecified: Secondary | ICD-10-CM | POA: Diagnosis not present

## 2022-12-30 DIAGNOSIS — Z7984 Long term (current) use of oral hypoglycemic drugs: Secondary | ICD-10-CM | POA: Diagnosis not present

## 2022-12-30 DIAGNOSIS — Z794 Long term (current) use of insulin: Secondary | ICD-10-CM | POA: Insufficient documentation

## 2022-12-30 DIAGNOSIS — Z86718 Personal history of other venous thrombosis and embolism: Secondary | ICD-10-CM | POA: Insufficient documentation

## 2022-12-30 DIAGNOSIS — R5383 Other fatigue: Secondary | ICD-10-CM | POA: Insufficient documentation

## 2022-12-30 DIAGNOSIS — D509 Iron deficiency anemia, unspecified: Secondary | ICD-10-CM | POA: Insufficient documentation

## 2022-12-30 DIAGNOSIS — Z7901 Long term (current) use of anticoagulants: Secondary | ICD-10-CM | POA: Diagnosis not present

## 2022-12-30 DIAGNOSIS — Z79899 Other long term (current) drug therapy: Secondary | ICD-10-CM | POA: Insufficient documentation

## 2022-12-30 DIAGNOSIS — I130 Hypertensive heart and chronic kidney disease with heart failure and stage 1 through stage 4 chronic kidney disease, or unspecified chronic kidney disease: Secondary | ICD-10-CM | POA: Insufficient documentation

## 2022-12-30 LAB — FOLATE: Folate: 20.5 ng/mL (ref >5.9)

## 2022-12-30 LAB — FERRITIN: Ferritin: 65 ng/mL (ref 11–307)

## 2022-12-30 LAB — IRON AND TIBC
Iron: 102 ug/dL (ref 28–170)
Saturation Ratios: 26 % (ref 10.4–31.8)
TIBC: 386 ug/dL (ref 250–450)
UIBC: 284 ug/dL

## 2022-12-30 LAB — VITAMIN B12: Vitamin B-12: 1322 pg/mL — ABNORMAL HIGH (ref 180–914)

## 2022-12-30 LAB — LACTATE DEHYDROGENASE: LDH: 201 U/L — ABNORMAL HIGH (ref 98–192)

## 2022-12-31 ENCOUNTER — Inpatient Hospital Stay: Payer: Medicare HMO

## 2022-12-31 DIAGNOSIS — L820 Inflamed seborrheic keratosis: Secondary | ICD-10-CM | POA: Diagnosis not present

## 2022-12-31 DIAGNOSIS — Z79899 Other long term (current) drug therapy: Secondary | ICD-10-CM | POA: Diagnosis not present

## 2022-12-31 DIAGNOSIS — D500 Iron deficiency anemia secondary to blood loss (chronic): Secondary | ICD-10-CM

## 2022-12-31 DIAGNOSIS — Z7901 Long term (current) use of anticoagulants: Secondary | ICD-10-CM | POA: Diagnosis not present

## 2022-12-31 DIAGNOSIS — R5383 Other fatigue: Secondary | ICD-10-CM | POA: Diagnosis not present

## 2022-12-31 DIAGNOSIS — D509 Iron deficiency anemia, unspecified: Secondary | ICD-10-CM | POA: Diagnosis not present

## 2022-12-31 DIAGNOSIS — I872 Venous insufficiency (chronic) (peripheral): Secondary | ICD-10-CM | POA: Diagnosis not present

## 2022-12-31 DIAGNOSIS — N189 Chronic kidney disease, unspecified: Secondary | ICD-10-CM | POA: Diagnosis not present

## 2022-12-31 DIAGNOSIS — Z794 Long term (current) use of insulin: Secondary | ICD-10-CM | POA: Diagnosis not present

## 2022-12-31 DIAGNOSIS — I130 Hypertensive heart and chronic kidney disease with heart failure and stage 1 through stage 4 chronic kidney disease, or unspecified chronic kidney disease: Secondary | ICD-10-CM | POA: Diagnosis not present

## 2022-12-31 DIAGNOSIS — B351 Tinea unguium: Secondary | ICD-10-CM | POA: Diagnosis not present

## 2022-12-31 DIAGNOSIS — Z86718 Personal history of other venous thrombosis and embolism: Secondary | ICD-10-CM | POA: Diagnosis not present

## 2022-12-31 DIAGNOSIS — Z87891 Personal history of nicotine dependence: Secondary | ICD-10-CM | POA: Diagnosis not present

## 2022-12-31 DIAGNOSIS — Z7984 Long term (current) use of oral hypoglycemic drugs: Secondary | ICD-10-CM | POA: Diagnosis not present

## 2022-12-31 LAB — CBC WITH DIFFERENTIAL/PLATELET
Abs Immature Granulocytes: 0.04 10*3/uL (ref 0.00–0.07)
Basophils Absolute: 0.1 10*3/uL (ref 0.0–0.1)
Basophils Relative: 1 %
Eosinophils Absolute: 0.2 10*3/uL (ref 0.0–0.5)
Eosinophils Relative: 3 %
HCT: 43.5 % (ref 36.0–46.0)
Hemoglobin: 13.2 g/dL (ref 12.0–15.0)
Immature Granulocytes: 0 %
Lymphocytes Relative: 30 %
Lymphs Abs: 2.7 10*3/uL (ref 0.7–4.0)
MCH: 28.4 pg (ref 26.0–34.0)
MCHC: 30.3 g/dL (ref 30.0–36.0)
MCV: 93.8 fL (ref 80.0–100.0)
Monocytes Absolute: 0.7 10*3/uL (ref 0.1–1.0)
Monocytes Relative: 7 %
Neutro Abs: 5.3 10*3/uL (ref 1.7–7.7)
Neutrophils Relative %: 59 %
Platelets: 221 10*3/uL (ref 150–400)
RBC: 4.64 MIL/uL (ref 3.87–5.11)
RDW: 23.9 % — ABNORMAL HIGH (ref 11.5–15.5)
WBC: 8.9 10*3/uL (ref 4.0–10.5)
nRBC: 0 % (ref 0.0–0.2)
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
## 2023-01-07 ENCOUNTER — Inpatient Hospital Stay (HOSPITAL_BASED_OUTPATIENT_CLINIC_OR_DEPARTMENT_OTHER): Payer: Medicare HMO | Admitting: Physician Assistant

## 2023-01-07 VITALS — BP 124/75 | HR 104 | Temp 96.9°F | Resp 20 | Wt 274.3 lb

## 2023-01-07 DIAGNOSIS — R5383 Other fatigue: Secondary | ICD-10-CM | POA: Diagnosis not present

## 2023-01-07 DIAGNOSIS — Z86718 Personal history of other venous thrombosis and embolism: Secondary | ICD-10-CM | POA: Diagnosis not present

## 2023-01-07 DIAGNOSIS — Z7901 Long term (current) use of anticoagulants: Secondary | ICD-10-CM | POA: Diagnosis not present

## 2023-01-07 DIAGNOSIS — Z79899 Other long term (current) drug therapy: Secondary | ICD-10-CM | POA: Diagnosis not present

## 2023-01-07 DIAGNOSIS — N189 Chronic kidney disease, unspecified: Secondary | ICD-10-CM | POA: Diagnosis not present

## 2023-01-07 DIAGNOSIS — Z794 Long term (current) use of insulin: Secondary | ICD-10-CM | POA: Diagnosis not present

## 2023-01-07 DIAGNOSIS — D509 Iron deficiency anemia, unspecified: Secondary | ICD-10-CM | POA: Diagnosis not present

## 2023-01-07 DIAGNOSIS — D500 Iron deficiency anemia secondary to blood loss (chronic): Secondary | ICD-10-CM | POA: Diagnosis not present

## 2023-01-07 DIAGNOSIS — Z87891 Personal history of nicotine dependence: Secondary | ICD-10-CM | POA: Diagnosis not present

## 2023-01-07 DIAGNOSIS — I130 Hypertensive heart and chronic kidney disease with heart failure and stage 1 through stage 4 chronic kidney disease, or unspecified chronic kidney disease: Secondary | ICD-10-CM | POA: Diagnosis not present

## 2023-01-07 DIAGNOSIS — Z7984 Long term (current) use of oral hypoglycemic drugs: Secondary | ICD-10-CM | POA: Diagnosis not present

## 2023-01-07 NOTE — Patient Instructions (Signed)
[facility] at [facility] **VISIT SUMMARY & IMPORTANT INSTRUCTIONS **   You were seen today by Rojelio Brenner PA-C for your iron deficiency anemia.   Your blood and iron levels have improved. You do not need any IV iron at this time. Continue taking iron pill every day.  FOLLOW-UP APPOINTMENT: Recheck labs with follow-up visit in 3 months  ** Thank you for trusting me with your healthcare!  I strive to provide all of my patients with quality care at each visit.  If you receive a survey for this visit, I would be so grateful to you for taking the time to provide feedback.  Thank you in advance!  ~                    Dr. Doreatha Massed   &   Rojelio Brenner, PA-C   - - - - - - - - - - - - - - - - - -    Thank you for choosing [facility] at [facility] to provide your oncology and hematology care.  To afford each patient quality time with our provider, please arrive at least 15 minutes before your scheduled appointment time.   If you have a lab appointment with the Cancer Center please come in thru the Main Entrance and check in at the main information desk.  You need to re-schedule your appointment should you arrive 10 or more minutes late.  We strive to give you quality time with our providers, and arriving late affects you and other patients whose appointments are after yours.  Also, if you no show three or more times for appointments you may be dismissed from the clinic at the providers discretion.     Again, thank you for choosing [facility].  Our hope is that these requests will decrease the amount of time that you wait before being seen by our physicians.       _____________________________________________________________  Should you have questions after your visit to [facility], please contact our office at [phone] and follow the prompts.  Our office hours are 8:00 a.m. and 4:30 p.m.  Monday - Friday.  Please note that voicemails left after 4:00 p.m. may not be returned until the following business day.  We are closed weekends and major holidays.  You do have access to a nurse 24-7, just call the main number to the clinic [phone] and do not press any options, hold on the line and a nurse will answer the phone.    For prescription refill requests, have your pharmacy contact our office and allow 72 hours.

## 2023-01-16 ENCOUNTER — Ambulatory Visit: Payer: Medicare HMO | Admitting: Family Medicine

## 2023-01-16 DIAGNOSIS — I509 Heart failure, unspecified: Secondary | ICD-10-CM | POA: Diagnosis not present

## 2023-01-16 DIAGNOSIS — E1165 Type 2 diabetes mellitus with hyperglycemia: Secondary | ICD-10-CM | POA: Diagnosis not present

## 2023-01-26 DIAGNOSIS — I5033 Acute on chronic diastolic (congestive) heart failure: Secondary | ICD-10-CM | POA: Diagnosis not present

## 2023-01-26 DIAGNOSIS — J9601 Acute respiratory failure with hypoxia: Secondary | ICD-10-CM | POA: Diagnosis not present

## 2023-01-26 DIAGNOSIS — I4891 Unspecified atrial fibrillation: Secondary | ICD-10-CM | POA: Diagnosis not present

## 2023-01-29 ENCOUNTER — Ambulatory Visit: Payer: Medicaid - Dental | Admitting: Internal Medicine

## 2023-01-30 ENCOUNTER — Ambulatory Visit (INDEPENDENT_AMBULATORY_CARE_PROVIDER_SITE_OTHER): Payer: Medicare HMO | Admitting: Internal Medicine

## 2023-01-30 ENCOUNTER — Encounter: Payer: Self-pay | Admitting: Internal Medicine

## 2023-01-30 VITALS — BP 123/61 | HR 85 | Temp 97.6°F | Ht 63.0 in | Wt 274.0 lb

## 2023-01-30 DIAGNOSIS — E1142 Type 2 diabetes mellitus with diabetic polyneuropathy: Secondary | ICD-10-CM | POA: Diagnosis not present

## 2023-01-30 DIAGNOSIS — K219 Gastro-esophageal reflux disease without esophagitis: Secondary | ICD-10-CM | POA: Diagnosis not present

## 2023-01-30 DIAGNOSIS — L603 Nail dystrophy: Secondary | ICD-10-CM | POA: Diagnosis not present

## 2023-01-30 DIAGNOSIS — D369 Benign neoplasm, unspecified site: Secondary | ICD-10-CM | POA: Diagnosis not present

## 2023-01-30 DIAGNOSIS — L84 Corns and callosities: Secondary | ICD-10-CM | POA: Diagnosis not present

## 2023-01-30 MED ORDER — ESOMEPRAZOLE MAGNESIUM 40 MG PO CPDR: 40.0000 mg | DELAYED_RELEASE_CAPSULE | Freq: Two times a day (BID) | ORAL | 3 refills | Status: DC
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
## 2023-01-30 NOTE — Patient Instructions (Signed)
  I am happy to hear you are doing better.  Continue on Nexium for your chronic reflux.  I sent a year supply refills to your mail in pharmacy.   We will hold off on colonoscopy for now.   Follow-up in 1 year or sooner if needed.  It was very nice seeing you again today.    Dr. Marletta Lor

## 2023-02-16 DIAGNOSIS — E1165 Type 2 diabetes mellitus with hyperglycemia: Secondary | ICD-10-CM | POA: Diagnosis not present

## 2023-02-16 DIAGNOSIS — I509 Heart failure, unspecified: Secondary | ICD-10-CM | POA: Diagnosis not present

## 2023-02-20 ENCOUNTER — Encounter: Payer: Self-pay | Admitting: "Endocrinology

## 2023-02-20 ENCOUNTER — Ambulatory Visit (INDEPENDENT_AMBULATORY_CARE_PROVIDER_SITE_OTHER): Payer: Medicare HMO | Admitting: "Endocrinology

## 2023-02-20 VITALS — BP 104/62 | HR 64 | Ht 63.0 in | Wt 276.2 lb

## 2023-02-20 DIAGNOSIS — E2740 Unspecified adrenocortical insufficiency: Secondary | ICD-10-CM | POA: Diagnosis not present

## 2023-02-20 DIAGNOSIS — E1165 Type 2 diabetes mellitus with hyperglycemia: Secondary | ICD-10-CM | POA: Diagnosis not present

## 2023-02-20 DIAGNOSIS — I10 Essential (primary) hypertension: Secondary | ICD-10-CM

## 2023-02-20 DIAGNOSIS — E039 Hypothyroidism, unspecified: Secondary | ICD-10-CM

## 2023-02-20 DIAGNOSIS — Z7984 Long term (current) use of oral hypoglycemic drugs: Secondary | ICD-10-CM | POA: Diagnosis not present

## 2023-02-20 DIAGNOSIS — E782 Mixed hyperlipidemia: Secondary | ICD-10-CM

## 2023-02-20 DIAGNOSIS — Z794 Long term (current) use of insulin: Secondary | ICD-10-CM | POA: Diagnosis not present

## 2023-02-20 DIAGNOSIS — Z6841 Body Mass Index (BMI) 40.0 and over, adult: Secondary | ICD-10-CM | POA: Diagnosis not present

## 2023-02-20 MED ORDER — INSULIN LISPRO PROT & LISPRO (75-25 MIX) 100 UNIT/ML KWIKPEN
40.0000 [IU] | PEN_INJECTOR | Freq: Two times a day (BID) | SUBCUTANEOUS | 1 refills | Status: AC
Start: 2023-02-20 — End: ?

## 2023-02-20 NOTE — Patient Instructions (Signed)
Advice for Weight Management  -For most of [place] the best way to lose weight is by diet management. Generally speaking, diet management means consuming less calories intentionally which over time brings about progressive weight loss.  This can be achieved more effectively by avoiding ultra processed carbohydrates, processed meats, unhealthy fats.    It is critically important to know your numbers: how much calorie you are consuming and how much calorie you need. More importantly, our carbohydrates sources should be unprocessed naturally occurring  complex starch food items.  It is always important to balance nutrition also by  appropriate intake of proteins (mainly plant-based), healthy fats/oils, plenty of fruits and vegetables.   -The American College of Lifestyle Medicine (ACL M) recommends nutrition derived mostly from Whole Food, Plant Predominant Sources example an apple instead of applesauce or apple pie. Eat Plenty of vegetables, Mushrooms, fruits, Legumes, Whole Grains, Nuts, seeds in lieu of processed meats, processed snacks/pastries red meat, poultry, eggs.  Use only water or unsweetened tea for hydration.  The College also recommends the need to stay away from risky substances including alcohol, smoking; obtaining 7-9 hours of restorative sleep, at least 150 minutes of moderate intensity exercise weekly, importance of healthy social connections, and being mindful of stress and seek help when it is overwhelming.    -Sticking to a routine mealtime to eat 3 meals a day and avoiding unnecessary snacks is shown to have a big role in weight control. Under normal circumstances, the only time we burn stored energy is when we are hungry, so allow  some hunger to take place- hunger means no food between appropriate meal times, only water.  It is not advisable to starve.   -It is better to avoid simple carbohydrates including:  Cakes, Sweet Desserts, Ice Cream, Soda (diet and regular), Sweet Tea, Candies, Chips, Cookies, Store Bought Juices, Alcohol in Excess of  1-2 drinks a day, Lemonade,  Artificial Sweeteners, Doughnuts, Coffee Creamers, "Sugar-free" Products, etc, etc.  This is not a complete list...Marland Kitchen.    -Consulting with certified diabetes educators is proven to provide you with the most accurate and current information on diet.  Also, you may be  interested in discussing diet options/exchanges , we can schedule a visit with Norm Salt, RDN, CDE for individualized nutrition education.  -Exercise: If you are able: 30 -60 minutes a day ,4 days a week, or 150 minutes of moderate intensity exercise weekly.    The longer the better if tolerated.  Combine stretch, strength, and aerobic activities.  If you were told in the past that you have high risk for cardiovascular diseases, or if you are currently symptomatic, you may seek evaluation by your heart doctor prior to initiating moderate to intense exercise programs.                                  Additional Care Considerations for Diabetes/Prediabetes   -Diabetes  is a chronic disease.  The most important care consideration is regular follow-up with your diabetes care provider with the goal being avoiding or delaying its complications and to take advantage of advances in medications and technology.  If appropriate actions are taken early enough, type 2 diabetes can even be  reversed.  Seek information from the right source.  - Whole Food, Plant Predominant Nutrition is highly recommended: Eat Plenty of vegetables, Mushrooms, fruits, Legumes, Whole Grains, Nuts, seeds in lieu of processed meats, processed snacks/pastries red meat, poultry, eggs as recommended by [employer] of  Lifestyle Medicine (ACLM).  -Type 2 diabetes is known to coexist with other important comorbidities such as high blood pressure and high cholesterol.  It is critical to control not only the  diabetes but also the high blood pressure and high cholesterol to minimize and delay the risk of complications including coronary artery disease, stroke, amputations, blindness, etc.  The good news is that this diet recommendation for type 2 diabetes is also very helpful for managing high cholesterol and high blood blood pressure.  - Studies showed that people with diabetes will benefit from a class of medications known as ACE inhibitors and statins.  Unless there are specific reasons not to be on these medications, the standard of care is to consider getting one from these groups of medications at an optimal doses.  These medications are generally considered safe and proven to help protect the heart and the kidneys.    - People with diabetes are encouraged to initiate and maintain regular follow-up with eye doctors, foot doctors, dentists , and if necessary heart and kidney doctors.     - It is highly recommended that people with diabetes quit smoking or stay away from smoking, and get yearly  flu vaccine and pneumonia vaccine at least every 5 years.  See above for additional recommendations on exercise, sleep, stress management , and healthy social connections.
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
## 2023-02-24 DIAGNOSIS — E1165 Type 2 diabetes mellitus with hyperglycemia: Secondary | ICD-10-CM | POA: Diagnosis not present

## 2023-02-24 DIAGNOSIS — N1832 Chronic kidney disease, stage 3b: Secondary | ICD-10-CM | POA: Diagnosis not present

## 2023-02-24 DIAGNOSIS — R2243 Localized swelling, mass and lump, lower limb, bilateral: Secondary | ICD-10-CM | POA: Diagnosis not present

## 2023-02-24 DIAGNOSIS — I509 Heart failure, unspecified: Secondary | ICD-10-CM | POA: Diagnosis not present

## 2023-02-25 ENCOUNTER — Other Ambulatory Visit: Payer: Self-pay

## 2023-02-25 DIAGNOSIS — I5033 Acute on chronic diastolic (congestive) heart failure: Secondary | ICD-10-CM | POA: Diagnosis not present

## 2023-02-25 DIAGNOSIS — I4891 Unspecified atrial fibrillation: Secondary | ICD-10-CM | POA: Diagnosis not present

## 2023-02-25 DIAGNOSIS — J9601 Acute respiratory failure with hypoxia: Secondary | ICD-10-CM | POA: Diagnosis not present

## 2023-02-25 DIAGNOSIS — E1165 Type 2 diabetes mellitus with hyperglycemia: Secondary | ICD-10-CM

## 2023-02-25 MED ORDER — INSULIN LISPRO PROT & LISPRO (75-25 MIX) 100 UNIT/ML KWIKPEN
40.0000 [IU] | PEN_INJECTOR | Freq: Two times a day (BID) | SUBCUTANEOUS | 1 refills | Status: DC
Start: 2023-02-25 — End: 2023-02-27

## 2023-02-27 ENCOUNTER — Other Ambulatory Visit: Payer: Self-pay | Admitting: "Endocrinology

## 2023-02-27 DIAGNOSIS — E1165 Type 2 diabetes mellitus with hyperglycemia: Secondary | ICD-10-CM

## 2023-03-04 ENCOUNTER — Encounter: Payer: Self-pay | Admitting: Hematology
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
## 2023-03-10 ENCOUNTER — Encounter: Payer: Self-pay | Admitting: Hematology

## 2023-03-10 ENCOUNTER — Encounter: Payer: Self-pay | Admitting: Internal Medicine

## 2023-03-10 ENCOUNTER — Ambulatory Visit (INDEPENDENT_AMBULATORY_CARE_PROVIDER_SITE_OTHER): Payer: Medicare HMO | Admitting: Internal Medicine

## 2023-03-10 VITALS — BP 115/71 | HR 86 | Ht 63.0 in | Wt 284.0 lb

## 2023-03-10 DIAGNOSIS — J9611 Chronic respiratory failure with hypoxia: Secondary | ICD-10-CM | POA: Diagnosis not present

## 2023-03-10 DIAGNOSIS — J449 Chronic obstructive pulmonary disease, unspecified: Secondary | ICD-10-CM

## 2023-03-10 MED ORDER — IPRATROPIUM-ALBUTEROL 0.5-2.5 (3) MG/3ML IN SOLN: 3.0000 mL | Freq: Four times a day (QID) | RESPIRATORY_TRACT | 11 refills | Status: AC

## 2023-03-10 MED ORDER — BUDESONIDE 0.25 MG/2ML IN SUSP: RESPIRATORY_TRACT | 12 refills | Status: AC

## 2023-03-10 NOTE — Patient Instructions (Signed)
Plan A = Automatic = Always=    duoneb four times daily (your nebulizer ) with budesonide first thing in am and supper time  Plan B = Backup (to supplement plan A, not to replace it) Only use your albuterol inhaler as a rescue medication to be used if you can't catch your breath by resting or doing a relaxed purse lip breathing pattern.  - The less you use it, the better it will work when you need it. - Ok to use the inhaler up to 2 puffs  every 4 hours if you must but call for appointment if use goes up over your usual need - Don't leave home without it !!  (think of it like the spare tire for your car)    Make sure you check your oxygen saturation  AT  your highest level of activity (not after you stop)   to be sure it stays over 90% and adjust  02 flow upward to maintain this level if needed but remember to turn it back to previous settings when you stop (to conserve your supply).    My office will be contacting you by phone for referral for Overnight 02 levels on 2lpm   - if you don't hear back from my office within one week please call us back or notify us thru MyChart and we'll address it right away.    Please schedule a follow up visit in 6 months but call sooner if needed - bring all meds and inhalers/solutions

## 2023-03-10 NOTE — Assessment & Plan Note (Signed)
As of 04/29/2022 using 02 2lpm and prn  - 05/23/22  ONO on "LPM" desats only in first hour so no change other than to wear it properly q HS  -  02 on arrival 03/10/2023  = 85% RA  - ONO on 2lpm 03/10/2023 >>>   Apparent cor pulmonale so target is sats around 90%

## 2023-03-10 NOTE — Assessment & Plan Note (Signed)
Quit smoking 1980 then gained over a hundred pounds - PFT's  12/04/16  FEV1 1.91 (85 % ) ratio 0.79  p 13 % improvement from saba p 0 prior to study with DLCO  12.14 (50%)   and FV curve min concave  and ERV 32% at wt 174    - 04/29/2022  After extensive coaching inhaler device,  effectiveness =    75% hfa  try change trelegy to [facility] and approp saba  - 03/10/2023  After extensive coaching inhaler device,  effectiveness =    0% so changed to duoneb qid with bud 0.25 bid   This should do better than alternatives which are more expensive and harder to use   Discussed in detail all the  indications, usual  risks and alternatives  relative to the benefits with patient who agrees to proceed with Rx as outlined.              Each maintenance medication was reviewed in detail including emphasizing most importantly the difference between maintenance and prns and under what circumstances the prns are to be triggered using an action plan format where appropriate.  Total time for H and P, chart review, counseling, reviewing hfa/dpi/02/neb device(s) and generating customized AVS unique to this office visit / same day charting = 31 min

## 2023-03-11 ENCOUNTER — Other Ambulatory Visit: Payer: Self-pay | Admitting: "Endocrinology

## 2023-03-11 ENCOUNTER — Telehealth: Payer: Self-pay

## 2023-03-11 MED ORDER — PREDNISONE 10 MG PO TABS: 10.0000 mg | ORAL_TABLET | Freq: Every day | ORAL | 3 refills | Status: DC

## 2023-03-11 NOTE — Telephone Encounter (Signed)
Pt requested Rx refill for prednisone 10mg  be sent to [facility] in [place].

## 2023-03-12 NOTE — Telephone Encounter (Signed)
Noted  

## 2023-03-16 ENCOUNTER — Emergency Department (HOSPITAL_COMMUNITY): Payer: Medicare HMO

## 2023-03-16 ENCOUNTER — Other Ambulatory Visit: Payer: Self-pay

## 2023-03-16 ENCOUNTER — Inpatient Hospital Stay (HOSPITAL_COMMUNITY)
Admission: EM | Admit: 2023-03-16 | Discharge: 2023-03-19 | DRG: 602 | Disposition: A | Discharge status: Home Health | Payer: Medicare HMO | Attending: Internal Medicine | Admitting: Internal Medicine

## 2023-03-16 ENCOUNTER — Encounter (HOSPITAL_COMMUNITY): Payer: Self-pay | Admitting: Family Medicine

## 2023-03-16 DIAGNOSIS — Z87891 Personal history of nicotine dependence: Secondary | ICD-10-CM

## 2023-03-16 DIAGNOSIS — E876 Hypokalemia: Secondary | ICD-10-CM | POA: Diagnosis present

## 2023-03-16 DIAGNOSIS — Z886 Allergy status to analgesic agent status: Secondary | ICD-10-CM

## 2023-03-16 DIAGNOSIS — J9621 Acute and chronic respiratory failure with hypoxia: Secondary | ICD-10-CM | POA: Diagnosis present

## 2023-03-16 DIAGNOSIS — Z7951 Long term (current) use of inhaled steroids: Secondary | ICD-10-CM

## 2023-03-16 DIAGNOSIS — Z66 Do not resuscitate: Secondary | ICD-10-CM | POA: Diagnosis present

## 2023-03-16 DIAGNOSIS — Z888 Allergy status to other drugs, medicaments and biological substances status: Secondary | ICD-10-CM

## 2023-03-16 DIAGNOSIS — E039 Hypothyroidism, unspecified: Secondary | ICD-10-CM | POA: Diagnosis present

## 2023-03-16 DIAGNOSIS — J9601 Acute respiratory failure with hypoxia: Secondary | ICD-10-CM

## 2023-03-16 DIAGNOSIS — Z801 Family history of malignant neoplasm of trachea, bronchus and lung: Secondary | ICD-10-CM

## 2023-03-16 DIAGNOSIS — L03115 Cellulitis of right lower limb: Secondary | ICD-10-CM | POA: Diagnosis not present

## 2023-03-16 DIAGNOSIS — Z800 Family history of malignant neoplasm of digestive organs: Secondary | ICD-10-CM

## 2023-03-16 DIAGNOSIS — Z7984 Long term (current) use of oral hypoglycemic drugs: Secondary | ICD-10-CM

## 2023-03-16 DIAGNOSIS — Z7952 Long term (current) use of systemic steroids: Secondary | ICD-10-CM

## 2023-03-16 DIAGNOSIS — I4891 Unspecified atrial fibrillation: Secondary | ICD-10-CM

## 2023-03-16 DIAGNOSIS — R0603 Acute respiratory distress: Secondary | ICD-10-CM | POA: Diagnosis not present

## 2023-03-16 DIAGNOSIS — I517 Cardiomegaly: Secondary | ICD-10-CM | POA: Diagnosis not present

## 2023-03-16 DIAGNOSIS — Z79890 Hormone replacement therapy (postmenopausal): Secondary | ICD-10-CM

## 2023-03-16 DIAGNOSIS — E2740 Unspecified adrenocortical insufficiency: Secondary | ICD-10-CM | POA: Diagnosis present

## 2023-03-16 DIAGNOSIS — L0390 Cellulitis, unspecified: Secondary | ICD-10-CM | POA: Diagnosis not present

## 2023-03-16 DIAGNOSIS — H409 Unspecified glaucoma: Secondary | ICD-10-CM | POA: Diagnosis present

## 2023-03-16 DIAGNOSIS — Z9981 Dependence on supplemental oxygen: Secondary | ICD-10-CM

## 2023-03-16 DIAGNOSIS — E1122 Type 2 diabetes mellitus with diabetic chronic kidney disease: Secondary | ICD-10-CM | POA: Diagnosis present

## 2023-03-16 DIAGNOSIS — E1142 Type 2 diabetes mellitus with diabetic polyneuropathy: Secondary | ICD-10-CM | POA: Diagnosis present

## 2023-03-16 DIAGNOSIS — E785 Hyperlipidemia, unspecified: Secondary | ICD-10-CM | POA: Diagnosis present

## 2023-03-16 DIAGNOSIS — Z955 Presence of coronary angioplasty implant and graft: Secondary | ICD-10-CM

## 2023-03-16 DIAGNOSIS — Z6841 Body Mass Index (BMI) 40.0 and over, adult: Secondary | ICD-10-CM

## 2023-03-16 DIAGNOSIS — Z981 Arthrodesis status: Secondary | ICD-10-CM

## 2023-03-16 DIAGNOSIS — R0602 Shortness of breath: Secondary | ICD-10-CM | POA: Diagnosis not present

## 2023-03-16 DIAGNOSIS — E1165 Type 2 diabetes mellitus with hyperglycemia: Secondary | ICD-10-CM | POA: Diagnosis present

## 2023-03-16 DIAGNOSIS — Z86718 Personal history of other venous thrombosis and embolism: Secondary | ICD-10-CM

## 2023-03-16 DIAGNOSIS — B952 Enterococcus as the cause of diseases classified elsewhere: Secondary | ICD-10-CM | POA: Diagnosis present

## 2023-03-16 DIAGNOSIS — I480 Paroxysmal atrial fibrillation: Secondary | ICD-10-CM | POA: Diagnosis present

## 2023-03-16 DIAGNOSIS — I25118 Atherosclerotic heart disease of native coronary artery with other forms of angina pectoris: Secondary | ICD-10-CM | POA: Diagnosis present

## 2023-03-16 DIAGNOSIS — R7881 Bacteremia: Secondary | ICD-10-CM | POA: Diagnosis present

## 2023-03-16 DIAGNOSIS — R0689 Other abnormalities of breathing: Secondary | ICD-10-CM | POA: Diagnosis not present

## 2023-03-16 DIAGNOSIS — R7989 Other specified abnormal findings of blood chemistry: Secondary | ICD-10-CM | POA: Diagnosis present

## 2023-03-16 DIAGNOSIS — J441 Chronic obstructive pulmonary disease with (acute) exacerbation: Principal | ICD-10-CM | POA: Diagnosis present

## 2023-03-16 DIAGNOSIS — Z87448 Personal history of other diseases of urinary system: Secondary | ICD-10-CM

## 2023-03-16 DIAGNOSIS — Z794 Long term (current) use of insulin: Secondary | ICD-10-CM

## 2023-03-16 DIAGNOSIS — T380X5A Adverse effect of glucocorticoids and synthetic analogues, initial encounter: Secondary | ICD-10-CM | POA: Diagnosis present

## 2023-03-16 DIAGNOSIS — L03116 Cellulitis of left lower limb: Secondary | ICD-10-CM | POA: Diagnosis present

## 2023-03-16 DIAGNOSIS — Z8249 Family history of ischemic heart disease and other diseases of the circulatory system: Secondary | ICD-10-CM

## 2023-03-16 DIAGNOSIS — Z9049 Acquired absence of other specified parts of digestive tract: Secondary | ICD-10-CM

## 2023-03-16 DIAGNOSIS — Z79899 Other long term (current) drug therapy: Secondary | ICD-10-CM

## 2023-03-16 DIAGNOSIS — I130 Hypertensive heart and chronic kidney disease with heart failure and stage 1 through stage 4 chronic kidney disease, or unspecified chronic kidney disease: Secondary | ICD-10-CM | POA: Diagnosis present

## 2023-03-16 DIAGNOSIS — Z860101 Personal history of adenomatous and serrated colon polyps: Secondary | ICD-10-CM

## 2023-03-16 DIAGNOSIS — I2510 Atherosclerotic heart disease of native coronary artery without angina pectoris: Secondary | ICD-10-CM | POA: Diagnosis present

## 2023-03-16 DIAGNOSIS — I5032 Chronic diastolic (congestive) heart failure: Secondary | ICD-10-CM | POA: Diagnosis present

## 2023-03-16 DIAGNOSIS — I5033 Acute on chronic diastolic (congestive) heart failure: Secondary | ICD-10-CM | POA: Diagnosis present

## 2023-03-16 DIAGNOSIS — I493 Ventricular premature depolarization: Secondary | ICD-10-CM | POA: Diagnosis present

## 2023-03-16 DIAGNOSIS — I872 Venous insufficiency (chronic) (peripheral): Secondary | ICD-10-CM | POA: Diagnosis present

## 2023-03-16 DIAGNOSIS — I878 Other specified disorders of veins: Secondary | ICD-10-CM | POA: Diagnosis present

## 2023-03-16 DIAGNOSIS — Z743 Need for continuous supervision: Secondary | ICD-10-CM | POA: Diagnosis not present

## 2023-03-16 DIAGNOSIS — Z1152 Encounter for screening for COVID-19: Secondary | ICD-10-CM

## 2023-03-16 DIAGNOSIS — Z7901 Long term (current) use of anticoagulants: Secondary | ICD-10-CM

## 2023-03-16 DIAGNOSIS — D649 Anemia, unspecified: Secondary | ICD-10-CM | POA: Diagnosis present

## 2023-03-16 DIAGNOSIS — I482 Chronic atrial fibrillation, unspecified: Secondary | ICD-10-CM | POA: Diagnosis present

## 2023-03-16 DIAGNOSIS — Z90710 Acquired absence of both cervix and uterus: Secondary | ICD-10-CM

## 2023-03-16 DIAGNOSIS — R0902 Hypoxemia: Secondary | ICD-10-CM | POA: Diagnosis not present

## 2023-03-16 LAB — URINALYSIS, W/ REFLEX TO CULTURE (INFECTION SUSPECTED)
Bacteria, UA: NONE SEEN
Bilirubin Urine: NEGATIVE
Glucose, UA: NEGATIVE mg/dL
Hgb urine dipstick: NEGATIVE
Ketones, ur: 5 mg/dL — AB
Leukocytes,Ua: NEGATIVE
Nitrite: NEGATIVE
Protein, ur: NEGATIVE mg/dL
Specific Gravity, Urine: 1.005 (ref 1.005–1.030)
pH: 6 (ref 5.0–8.0)

## 2023-03-16 LAB — BASIC METABOLIC PANEL
Anion gap: 10 (ref 5–15)
BUN: 9 mg/dL (ref 8–23)
CO2: 27 mmol/L (ref 22–32)
Calcium: 8.7 mg/dL — ABNORMAL LOW (ref 8.9–10.3)
Chloride: 103 mmol/L (ref 98–111)
Creatinine, Ser: 0.7 mg/dL (ref 0.44–1.00)
GFR, Estimated: >60 mL/min (ref >60)
Glucose, Bld: 144 mg/dL — ABNORMAL HIGH (ref 70–99)
Potassium: 3.4 mmol/L — ABNORMAL LOW (ref 3.5–5.1)
Sodium: 140 mmol/L (ref 135–145)

## 2023-03-16 LAB — CBC WITH DIFFERENTIAL/PLATELET
Abs Immature Granulocytes: 0.02 10*3/uL (ref 0.00–0.07)
Basophils Absolute: 0 10*3/uL (ref 0.0–0.1)
Basophils Relative: 1 %
Eosinophils Absolute: 0.5 10*3/uL (ref 0.0–0.5)
Eosinophils Relative: 6 %
HCT: 32 % — ABNORMAL LOW (ref 36.0–46.0)
Hemoglobin: 10 g/dL — ABNORMAL LOW (ref 12.0–15.0)
Immature Granulocytes: 0 %
Lymphocytes Relative: 17 %
Lymphs Abs: 1.4 10*3/uL (ref 0.7–4.0)
MCH: 30.6 pg (ref 26.0–34.0)
MCHC: 31.3 g/dL (ref 30.0–36.0)
MCV: 97.9 fL (ref 80.0–100.0)
Monocytes Absolute: 0.6 10*3/uL (ref 0.1–1.0)
Monocytes Relative: 8 %
Neutro Abs: 5.4 10*3/uL (ref 1.7–7.7)
Neutrophils Relative %: 68 %
Platelets: 183 10*3/uL (ref 150–400)
RBC: 3.27 MIL/uL — ABNORMAL LOW (ref 3.87–5.11)
RDW: 13.6 % (ref 11.5–15.5)
WBC: 7.9 10*3/uL (ref 4.0–10.5)
nRBC: 0 % (ref 0.0–0.2)

## 2023-03-16 LAB — RESP PANEL BY RT-PCR (RSV, FLU A&B, COVID)  RVPGX2
Influenza A by PCR: NEGATIVE
Influenza B by PCR: NEGATIVE
Resp Syncytial Virus by PCR: NEGATIVE
SARS Coronavirus 2 by RT PCR: NEGATIVE

## 2023-03-16 LAB — BLOOD GAS, VENOUS
Acid-Base Excess: 6.9 mmol/L — ABNORMAL HIGH (ref 0.0–2.0)
Bicarbonate: 33 mmol/L — ABNORMAL HIGH (ref 20.0–28.0)
Drawn by: 27160
O2 Saturation: 47 %
Patient temperature: 37.4
pCO2, Ven: 53 mm[Hg] (ref 44–60)
pH, Ven: 7.4 (ref 7.25–7.43)
pO2, Ven: <31 mm[Hg] — CL (ref 32–45)

## 2023-03-16 LAB — LACTIC ACID, PLASMA
Lactic Acid, Venous: 1.2 mmol/L (ref 0.5–1.9)
Lactic Acid, Venous: 1.6 mmol/L (ref 0.5–1.9)

## 2023-03-16 LAB — BRAIN NATRIURETIC PEPTIDE: B Natriuretic Peptide: 344 pg/mL — ABNORMAL HIGH (ref 0.0–100.0)

## 2023-03-16 LAB — TROPONIN I (HIGH SENSITIVITY)
Troponin I (High Sensitivity): 20 ng/L — ABNORMAL HIGH (ref <18)
Troponin I (High Sensitivity): 17 ng/L (ref <18)

## 2023-03-16 LAB — GLUCOSE, CAPILLARY
Glucose-Capillary: 178 mg/dL — ABNORMAL HIGH (ref 70–99)
Glucose-Capillary: 226 mg/dL — ABNORMAL HIGH (ref 70–99)

## 2023-03-16 LAB — CBG MONITORING, ED: Glucose-Capillary: 171 mg/dL — ABNORMAL HIGH (ref 70–99)

## 2023-03-16 LAB — MAGNESIUM: Magnesium: 1.6 mg/dL — ABNORMAL LOW (ref 1.7–2.4)

## 2023-03-16 LAB — PROTIME-INR
INR: 1.2 (ref 0.8–1.2)
Prothrombin Time: 15.3 s — ABNORMAL HIGH (ref 11.4–15.2)

## 2023-03-16 MED ORDER — MIRABEGRON ER 25 MG PO TB24
25.0000 mg | ORAL_TABLET | Freq: Every day | ORAL | Status: DC
  Administered 2023-03-16 – 2023-03-19 (×4): 25 mg via ORAL
  Filled 2023-03-16 (×4): qty 1

## 2023-03-16 MED ORDER — FERROUS SULFATE 325 (65 FE) MG PO TABS
325.0000 mg | ORAL_TABLET | Freq: Every day | ORAL | Status: DC
  Administered 2023-03-17 – 2023-03-19 (×3): 325 mg via ORAL
  Filled 2023-03-16 (×3): qty 1

## 2023-03-16 MED ORDER — SODIUM CHLORIDE 0.9 % IV SOLN
1.0000 g | INTRAVENOUS | Status: DC
  Administered 2023-03-17: 1 g via INTRAVENOUS
  Filled 2023-03-16: qty 10

## 2023-03-16 MED ORDER — AZITHROMYCIN 500 MG IV SOLR
500.0000 mg | Freq: Once | INTRAVENOUS | Status: AC
  Administered 2023-03-16: 500 mg via INTRAVENOUS
  Filled 2023-03-16: qty 5

## 2023-03-16 MED ORDER — DOXYCYCLINE HYCLATE 100 MG PO TABS
100.0000 mg | ORAL_TABLET | Freq: Two times a day (BID) | ORAL | Status: DC
  Administered 2023-03-16 – 2023-03-17 (×3): 100 mg via ORAL
  Filled 2023-03-16 (×3): qty 1

## 2023-03-16 MED ORDER — ACETAMINOPHEN 325 MG PO TABS
650.0000 mg | ORAL_TABLET | Freq: Four times a day (QID) | ORAL | Status: DC | PRN
  Administered 2023-03-16 – 2023-03-18 (×4): 650 mg via ORAL
  Filled 2023-03-16 (×4): qty 2

## 2023-03-16 MED ORDER — SODIUM CHLORIDE 0.9% FLUSH
3.0000 mL | Freq: Two times a day (BID) | INTRAVENOUS | Status: DC
Start: 2023-03-16 — End: 2023-03-19
  Administered 2023-03-17 (×2): 3 mL via INTRAVENOUS

## 2023-03-16 MED ORDER — APIXABAN 5 MG PO TABS
5.0000 mg | ORAL_TABLET | Freq: Two times a day (BID) | ORAL | Status: DC
Start: 2023-03-16 — End: 2023-03-19
  Administered 2023-03-16 – 2023-03-19 (×6): 5 mg via ORAL
  Filled 2023-03-16 (×6): qty 1

## 2023-03-16 MED ORDER — GLIPIZIDE ER 5 MG PO TB24
5.0000 mg | ORAL_TABLET | Freq: Every day | ORAL | Status: DC
  Administered 2023-03-17 – 2023-03-19 (×3): 5 mg via ORAL
  Filled 2023-03-16 (×3): qty 1

## 2023-03-16 MED ORDER — ONDANSETRON HCL 4 MG PO TABS: 4.0000 mg | ORAL_TABLET | Freq: Four times a day (QID) | ORAL | Status: DC | PRN

## 2023-03-16 MED ORDER — TRAZODONE HCL 50 MG PO TABS: 50.0000 mg | ORAL_TABLET | Freq: Every evening | ORAL | Status: DC | PRN

## 2023-03-16 MED ORDER — SODIUM CHLORIDE 0.9% FLUSH
10.0000 mL | Freq: Two times a day (BID) | INTRAVENOUS | Status: DC
  Administered 2023-03-16 – 2023-03-19 (×6): 10 mL via INTRAVENOUS

## 2023-03-16 MED ORDER — PANTOPRAZOLE SODIUM 40 MG PO TBEC
40.0000 mg | DELAYED_RELEASE_TABLET | Freq: Every day | ORAL | Status: DC
  Administered 2023-03-16 – 2023-03-19 (×4): 40 mg via ORAL
  Filled 2023-03-16 (×4): qty 1

## 2023-03-16 MED ORDER — NITROGLYCERIN 0.4 MG SL SUBL: 0.4000 mg | SUBLINGUAL_TABLET | SUBLINGUAL | Status: DC | PRN

## 2023-03-16 MED ORDER — LEVOTHYROXINE SODIUM 75 MCG PO TABS
175.0000 ug | ORAL_TABLET | Freq: Every day | ORAL | Status: DC
  Administered 2023-03-17 – 2023-03-19 (×3): 175 ug via ORAL
  Filled 2023-03-16 (×3): qty 1

## 2023-03-16 MED ORDER — METHYLPREDNISOLONE SODIUM SUCC 125 MG IJ SOLR
125.0000 mg | Freq: Once | INTRAMUSCULAR | Status: AC
  Administered 2023-03-16: 125 mg via INTRAVENOUS
  Filled 2023-03-16: qty 2

## 2023-03-16 MED ORDER — GUAIFENESIN ER 600 MG PO TB12: 600.0000 mg | ORAL_TABLET | Freq: Two times a day (BID) | ORAL | Status: DC | PRN

## 2023-03-16 MED ORDER — ONDANSETRON HCL 4 MG/2ML IJ SOLN
4.0000 mg | Freq: Four times a day (QID) | INTRAMUSCULAR | Status: DC | PRN
  Administered 2023-03-16: 4 mg via INTRAVENOUS
  Filled 2023-03-16: qty 2

## 2023-03-16 MED ORDER — INSULIN ASPART 100 UNIT/ML IJ SOLN
3.0000 [IU] | Freq: Three times a day (TID) | INTRAMUSCULAR | Status: DC
  Administered 2023-03-16 – 2023-03-19 (×8): 3 [IU] via SUBCUTANEOUS
  Filled 2023-03-16: qty 1

## 2023-03-16 MED ORDER — FUROSEMIDE 40 MG PO TABS
40.0000 mg | ORAL_TABLET | Freq: Two times a day (BID) | ORAL | Status: DC
  Administered 2023-03-17 – 2023-03-19 (×5): 40 mg via ORAL
  Filled 2023-03-16 (×5): qty 1

## 2023-03-16 MED ORDER — OXYCODONE HCL 5 MG PO TABS
5.0000 mg | ORAL_TABLET | Freq: Four times a day (QID) | ORAL | Status: AC | PRN
Start: 2023-03-16 — End: 2023-03-16
  Administered 2023-03-16: 5 mg via ORAL
  Filled 2023-03-16: qty 1

## 2023-03-16 MED ORDER — LACTATED RINGERS IV SOLN: INTRAVENOUS | Status: DC

## 2023-03-16 MED ORDER — SODIUM CHLORIDE 0.9% FLUSH
3.0000 mL | Freq: Two times a day (BID) | INTRAVENOUS | Status: DC
  Administered 2023-03-17: 3 mL via INTRAVENOUS

## 2023-03-16 MED ORDER — INSULIN ASPART 100 UNIT/ML IJ SOLN
0.0000 [IU] | Freq: Three times a day (TID) | INTRAMUSCULAR | Status: DC
  Administered 2023-03-16 (×2): 3 [IU] via SUBCUTANEOUS
  Administered 2023-03-17 (×2): 8 [IU] via SUBCUTANEOUS
  Administered 2023-03-17: 5 [IU] via SUBCUTANEOUS
  Administered 2023-03-18 (×2): 11 [IU] via SUBCUTANEOUS
  Administered 2023-03-18: 8 [IU] via SUBCUTANEOUS
  Administered 2023-03-19 (×2): 11 [IU] via SUBCUTANEOUS
  Filled 2023-03-16: qty 1

## 2023-03-16 MED ORDER — IPRATROPIUM-ALBUTEROL 0.5-2.5 (3) MG/3ML IN SOLN
3.0000 mL | Freq: Four times a day (QID) | RESPIRATORY_TRACT | Status: DC
  Administered 2023-03-16 – 2023-03-19 (×11): 3 mL via RESPIRATORY_TRACT
  Filled 2023-03-16 (×11): qty 3

## 2023-03-16 MED ORDER — ALBUTEROL SULFATE (2.5 MG/3ML) 0.083% IN NEBU: 2.5000 mg | INHALATION_SOLUTION | RESPIRATORY_TRACT | Status: DC | PRN

## 2023-03-16 MED ORDER — POLYETHYLENE GLYCOL 3350 17 G PO PACK: 17.0000 g | PACK | Freq: Every day | ORAL | Status: DC | PRN

## 2023-03-16 MED ORDER — GABAPENTIN 400 MG PO CAPS
400.0000 mg | ORAL_CAPSULE | Freq: Three times a day (TID) | ORAL | Status: DC
  Administered 2023-03-16 – 2023-03-19 (×9): 400 mg via ORAL
  Filled 2023-03-16 (×9): qty 1

## 2023-03-16 MED ORDER — DILTIAZEM HCL ER COATED BEADS 120 MG PO CP24
120.0000 mg | ORAL_CAPSULE | Freq: Every day | ORAL | Status: DC
  Administered 2023-03-16 – 2023-03-19 (×4): 120 mg via ORAL
  Filled 2023-03-16 (×4): qty 1

## 2023-03-16 MED ORDER — CEFTRIAXONE SODIUM 2 G IJ SOLR
2.0000 g | Freq: Once | INTRAMUSCULAR | Status: AC
  Administered 2023-03-16: 2 g via INTRAVENOUS
  Filled 2023-03-16: qty 20

## 2023-03-16 MED ORDER — ACETAMINOPHEN 650 MG RE SUPP: 650.0000 mg | Freq: Four times a day (QID) | RECTAL | Status: DC | PRN

## 2023-03-16 MED ORDER — GERHARDT'S BUTT CREAM
TOPICAL_CREAM | Freq: Two times a day (BID) | CUTANEOUS | Status: DC
  Filled 2023-03-16: qty 1

## 2023-03-16 MED ORDER — PREDNISONE 20 MG PO TABS
20.0000 mg | ORAL_TABLET | Freq: Every day | ORAL | Status: DC
  Administered 2023-03-17: 20 mg via ORAL
  Filled 2023-03-16: qty 1

## 2023-03-16 MED ORDER — ATORVASTATIN CALCIUM 40 MG PO TABS
80.0000 mg | ORAL_TABLET | Freq: Every day | ORAL | Status: DC
Start: 2023-03-16 — End: 2023-03-19
  Administered 2023-03-16 – 2023-03-18 (×3): 80 mg via ORAL
  Filled 2023-03-16 (×3): qty 2

## 2023-03-16 MED ORDER — BISACODYL 10 MG RE SUPP: 10.0000 mg | Freq: Every day | RECTAL | Status: DC | PRN

## 2023-03-16 MED ORDER — LACTATED RINGERS IV SOLN
INTRAVENOUS | Status: AC
Start: 2023-03-16 — End: 2023-03-17

## 2023-03-16 MED ORDER — VITAMIN B-12 1000 MCG PO TABS
1000.0000 ug | ORAL_TABLET | Freq: Every day | ORAL | Status: DC
  Administered 2023-03-16 – 2023-03-19 (×4): 1000 ug via ORAL
  Filled 2023-03-16 (×4): qty 1

## 2023-03-16 MED ORDER — LEVOTHYROXINE SODIUM 50 MCG PO TABS: 175.0000 ug | ORAL_TABLET | Freq: Every morning | ORAL | Status: DC

## 2023-03-16 MED ORDER — FUROSEMIDE 10 MG/ML IJ SOLN
40.0000 mg | Freq: Two times a day (BID) | INTRAMUSCULAR | Status: DC
  Administered 2023-03-16: 40 mg via INTRAVENOUS
  Filled 2023-03-16: qty 4

## 2023-03-16 MED ORDER — BISOPROLOL FUMARATE 5 MG PO TABS
5.0000 mg | ORAL_TABLET | Freq: Every day | ORAL | Status: DC
  Administered 2023-03-16 – 2023-03-19 (×4): 5 mg via ORAL
  Filled 2023-03-16 (×4): qty 1

## 2023-03-16 MED ORDER — MAGNESIUM SULFATE 2 GM/50ML IV SOLN
2.0000 g | Freq: Once | INTRAVENOUS | Status: AC
  Administered 2023-03-16: 2 g via INTRAVENOUS
  Filled 2023-03-16: qty 50

## 2023-03-16 MED ORDER — IPRATROPIUM-ALBUTEROL 0.5-2.5 (3) MG/3ML IN SOLN
3.0000 mL | Freq: Once | RESPIRATORY_TRACT | Status: AC
  Administered 2023-03-16: 3 mL via RESPIRATORY_TRACT
  Filled 2023-03-16: qty 3

## 2023-03-16 MED ORDER — INSULIN ASPART 100 UNIT/ML IJ SOLN
0.0000 [IU] | Freq: Every day | INTRAMUSCULAR | Status: DC
  Administered 2023-03-16: 2 [IU] via SUBCUTANEOUS
  Administered 2023-03-17: 4 [IU] via SUBCUTANEOUS
  Administered 2023-03-18: 5 [IU] via SUBCUTANEOUS

## 2023-03-16 MED ORDER — METHYLPREDNISOLONE SODIUM SUCC 40 MG IJ SOLR
40.0000 mg | Freq: Two times a day (BID) | INTRAMUSCULAR | Status: DC
  Administered 2023-03-16 – 2023-03-18 (×4): 40 mg via INTRAVENOUS
  Filled 2023-03-16 (×4): qty 1

## 2023-03-16 MED ORDER — SODIUM CHLORIDE 0.9 % IV BOLUS (SEPSIS)
1000.0000 mL | Freq: Once | INTRAVENOUS | Status: AC
  Administered 2023-03-16: 1000 mL via INTRAVENOUS

## 2023-03-16 MED ORDER — SODIUM CHLORIDE 0.9% FLUSH: 3.0000 mL | INTRAVENOUS | Status: DC | PRN
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
## 2023-03-16 NOTE — ED Triage Notes (Signed)
Pt arrived [facility] for SOB. Pt normally uses 2 lpm and her oxygen was 84%. [facility] applied CPAP Cellulitis noted to bilateral legs, warm to touch. Pt alert &O x 4. [facility] 22 g Left hand.
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
## 2023-03-16 NOTE — ED Notes (Signed)
Food tray given to patient 

## 2023-03-16 NOTE — Sepsis Progress Note (Signed)
Elink monitoring for the code sepsis protocol.  

## 2023-03-17 ENCOUNTER — Other Ambulatory Visit: Payer: Self-pay | Admitting: Nurse Practitioner

## 2023-03-17 DIAGNOSIS — L03119 Cellulitis of unspecified part of limb: Secondary | ICD-10-CM | POA: Diagnosis not present

## 2023-03-17 DIAGNOSIS — R7881 Bacteremia: Secondary | ICD-10-CM

## 2023-03-17 DIAGNOSIS — Z66 Do not resuscitate: Secondary | ICD-10-CM | POA: Diagnosis not present

## 2023-03-17 DIAGNOSIS — E1165 Type 2 diabetes mellitus with hyperglycemia: Secondary | ICD-10-CM | POA: Diagnosis not present

## 2023-03-17 DIAGNOSIS — Z1152 Encounter for screening for COVID-19: Secondary | ICD-10-CM | POA: Diagnosis not present

## 2023-03-17 DIAGNOSIS — D649 Anemia, unspecified: Secondary | ICD-10-CM | POA: Diagnosis not present

## 2023-03-17 DIAGNOSIS — E1122 Type 2 diabetes mellitus with diabetic chronic kidney disease: Secondary | ICD-10-CM | POA: Diagnosis not present

## 2023-03-17 DIAGNOSIS — J441 Chronic obstructive pulmonary disease with (acute) exacerbation: Secondary | ICD-10-CM | POA: Diagnosis not present

## 2023-03-17 DIAGNOSIS — E2740 Unspecified adrenocortical insufficiency: Secondary | ICD-10-CM | POA: Diagnosis not present

## 2023-03-17 DIAGNOSIS — Z794 Long term (current) use of insulin: Secondary | ICD-10-CM | POA: Diagnosis not present

## 2023-03-17 DIAGNOSIS — L0390 Cellulitis, unspecified: Secondary | ICD-10-CM | POA: Diagnosis present

## 2023-03-17 DIAGNOSIS — R0603 Acute respiratory distress: Secondary | ICD-10-CM | POA: Diagnosis not present

## 2023-03-17 DIAGNOSIS — Z6841 Body Mass Index (BMI) 40.0 and over, adult: Secondary | ICD-10-CM | POA: Diagnosis not present

## 2023-03-17 DIAGNOSIS — E039 Hypothyroidism, unspecified: Secondary | ICD-10-CM | POA: Diagnosis not present

## 2023-03-17 DIAGNOSIS — B952 Enterococcus as the cause of diseases classified elsewhere: Secondary | ICD-10-CM | POA: Diagnosis not present

## 2023-03-17 DIAGNOSIS — L03115 Cellulitis of right lower limb: Secondary | ICD-10-CM | POA: Diagnosis not present

## 2023-03-17 DIAGNOSIS — L03116 Cellulitis of left lower limb: Secondary | ICD-10-CM | POA: Diagnosis not present

## 2023-03-17 DIAGNOSIS — E1142 Type 2 diabetes mellitus with diabetic polyneuropathy: Secondary | ICD-10-CM | POA: Diagnosis not present

## 2023-03-17 DIAGNOSIS — J9621 Acute and chronic respiratory failure with hypoxia: Secondary | ICD-10-CM | POA: Diagnosis not present

## 2023-03-17 DIAGNOSIS — E876 Hypokalemia: Secondary | ICD-10-CM | POA: Diagnosis not present

## 2023-03-17 DIAGNOSIS — I480 Paroxysmal atrial fibrillation: Secondary | ICD-10-CM | POA: Diagnosis not present

## 2023-03-17 DIAGNOSIS — I130 Hypertensive heart and chronic kidney disease with heart failure and stage 1 through stage 4 chronic kidney disease, or unspecified chronic kidney disease: Secondary | ICD-10-CM | POA: Diagnosis not present

## 2023-03-17 DIAGNOSIS — E785 Hyperlipidemia, unspecified: Secondary | ICD-10-CM | POA: Diagnosis not present

## 2023-03-17 DIAGNOSIS — Z9981 Dependence on supplemental oxygen: Secondary | ICD-10-CM | POA: Diagnosis not present

## 2023-03-17 DIAGNOSIS — I5032 Chronic diastolic (congestive) heart failure: Secondary | ICD-10-CM | POA: Diagnosis not present

## 2023-03-17 LAB — BLOOD CULTURE ID PANEL (REFLEXED) - BCID2
A.calcoaceticus-baumannii: NOT DETECTED
Bacteroides fragilis: NOT DETECTED
Candida albicans: NOT DETECTED
Candida auris: NOT DETECTED
Candida glabrata: NOT DETECTED
Candida krusei: NOT DETECTED
Candida parapsilosis: NOT DETECTED
Candida tropicalis: NOT DETECTED
Cryptococcus neoformans/gattii: NOT DETECTED
Enterobacter cloacae complex: NOT DETECTED
Enterobacterales: NOT DETECTED
Enterococcus Faecium: NOT DETECTED
Enterococcus faecalis: DETECTED — AB
Escherichia coli: NOT DETECTED
Haemophilus influenzae: NOT DETECTED
Klebsiella aerogenes: NOT DETECTED
Klebsiella oxytoca: NOT DETECTED
Klebsiella pneumoniae: NOT DETECTED
Listeria monocytogenes: NOT DETECTED
Methicillin resistance mecA/C: DETECTED — AB
Neisseria meningitidis: NOT DETECTED
Proteus species: NOT DETECTED
Pseudomonas aeruginosa: NOT DETECTED
Salmonella species: NOT DETECTED
Serratia marcescens: NOT DETECTED
Staphylococcus aureus (BCID): NOT DETECTED
Staphylococcus epidermidis: DETECTED — AB
Staphylococcus lugdunensis: NOT DETECTED
Staphylococcus species: DETECTED — AB
Stenotrophomonas maltophilia: NOT DETECTED
Streptococcus agalactiae: NOT DETECTED
Streptococcus pneumoniae: NOT DETECTED
Streptococcus pyogenes: NOT DETECTED
Streptococcus species: NOT DETECTED
Vancomycin resistance: NOT DETECTED

## 2023-03-17 LAB — GLUCOSE, CAPILLARY
Glucose-Capillary: 237 mg/dL — ABNORMAL HIGH (ref 70–99)
Glucose-Capillary: 261 mg/dL — ABNORMAL HIGH (ref 70–99)
Glucose-Capillary: 275 mg/dL — ABNORMAL HIGH (ref 70–99)
Glucose-Capillary: 313 mg/dL — ABNORMAL HIGH (ref 70–99)

## 2023-03-17 MED ORDER — SALINE SPRAY 0.65 % NA SOLN
1.0000 | NASAL | Status: DC | PRN
  Administered 2023-03-18: 1 via NASAL
  Filled 2023-03-17: qty 44

## 2023-03-17 MED ORDER — GUAIFENESIN-DM 100-10 MG/5ML PO SYRP
5.0000 mL | ORAL_SOLUTION | ORAL | Status: DC | PRN
  Administered 2023-03-17: 5 mL via ORAL
  Filled 2023-03-17: qty 5

## 2023-03-17 MED ORDER — INSULIN GLARGINE-YFGN 100 UNIT/ML ~~LOC~~ SOLN
6.0000 [IU] | Freq: Every day | SUBCUTANEOUS | Status: DC
  Administered 2023-03-17 – 2023-03-19 (×3): 6 [IU] via SUBCUTANEOUS
  Filled 2023-03-17 (×4): qty 0.06

## 2023-03-17 MED ORDER — SODIUM CHLORIDE 0.9 % IV SOLN
2.0000 g | INTRAVENOUS | Status: DC
  Administered 2023-03-17 – 2023-03-19 (×11): 2 g via INTRAVENOUS
  Filled 2023-03-17 (×18): qty 2000

## 2023-03-17 MED ORDER — DM-GUAIFENESIN ER 30-600 MG PO TB12
1.0000 | ORAL_TABLET | Freq: Two times a day (BID) | ORAL | Status: DC
  Administered 2023-03-17 – 2023-03-19 (×5): 1 via ORAL
  Filled 2023-03-17 (×5): qty 1
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
## 2023-03-17 NOTE — Progress Notes (Signed)
Date and time results received: 03/17/23 0521  Test: Blood Cultures Critical Value: 2+ gram positive cocci  Name of Provider Notified: Thomes Dinning, MD  Orders Received? Or Actions Taken?: No new orders at this time.

## 2023-03-17 NOTE — Plan of Care (Signed)
  Problem: Nutritional: Goal: Maintenance of adequate nutrition will improve Outcome: Progressing   

## 2023-03-17 NOTE — Progress Notes (Addendum)
   03/17/23 0359  Assess: MEWS Score  Temp 97.8 F (36.6 C)  BP (!) 146/99  MAP (mmHg) 110  Pulse Rate (!) 120  Resp 17  Level of Consciousness Alert  SpO2 94 %  O2 Device Nasal Cannula  Assess: MEWS Score  MEWS Temp 0  MEWS Systolic 0  MEWS Pulse 2  MEWS RR 0  MEWS LOC 0  MEWS Score 2  MEWS Score Color Yellow  Assess: if the MEWS score is Yellow or Red  Were vital signs accurate and taken at a resting state? Yes  Does the patient meet 2 or more of the SIRS criteria? No  MEWS guidelines implemented  Yes, yellow  Treat  MEWS Interventions Considered administering scheduled or prn medications/treatments as ordered  Take Vital Signs  Increase Vital Sign Frequency  Yellow: Q2hr x1, continue Q4hrs until patient remains green for 12hrs  Escalate  MEWS: Escalate Yellow: Discuss with charge nurse and consider notifying provider and/or RRT  Notify: Charge Nurse/RN  Name of Charge Nurse/RN Notified Alan Ripper, RN  Provider Notification  Provider Name/Title Thomes Dinning, MD  Date Provider Notified 03/17/23  Time Provider Notified [phone]  Method of Notification  (secure chat)  Notification Reason Change in status  Provider response No new orders  Assess: SIRS CRITERIA  SIRS Temperature  0  SIRS Pulse 1  SIRS Respirations  0  SIRS WBC 0  SIRS Score Sum  1   MD aware of pts elevated heart rate and yellow mews status. No new orders at this time.

## 2023-03-17 NOTE — TOC Initial Note (Signed)
Transition of Care [facility]) - Initial/Assessment Note    Patient Details  Name: Ann [name] MRN: [id number] Date of Birth: [birth year]  Transition of Care [facility]) CM/SW Contact:    Verlinda Slotnick A Lexx Monte, RN Phone Number: 03/17/2023, 5:38 PM  Clinical Narrative:                 Pt admitted with COPD exacerbation and CHF. Lives with nine family members- includes spouse adult children and grand-children within [place]. Alert and oriented x4.  Uses walker at home and wheelchair outside of home. Patient explains is needing more assistance with ADLs especially with getting in/out of the shower and cooking meals. Oldest son takes patient to appointments and errands. Pt says daughter sometimes will do the cooking but mostly everyone in the house works so does not have a lot of help.  Tries to eat a heart healthy diet. Not able to weigh herself due to not being able to balance self on the scale therefore relies on the scales at doctor's office.  Compliant with taking medications and has no questions about medications. Pt also says not able to think of any further needs at this time.  Transition of care has fully assessed patient and currently no needs identified. Will continue to monitor patient advancement through interdisciplinary progression rounds. If new patient transition needs arise, please place a TOC consult.   Expected Discharge Plan: Home/Self Care Barriers to Discharge: Continued Medical Work up   Patient Goals and CMS Choice Patient states their goals for this hospitalization and ongoing recovery are:: To go back home CMS Medicare.gov Compare Post Acute Care list provided to:: Patient Choice offered to / list presented to : NA      Expected Discharge Plan and Services In-house Referral: NA Discharge Planning Services: NA Post Acute Care Choice: NA Living arrangements for the past 2 months: Single Family Home                 DME Arranged: N/A DME Agency: NA       HH Arranged:  NA HH Agency: NA        Prior Living Arrangements/Services Living arrangements for the past 2 months: Single Family Home Lives with:: Adult Children (23- Family members) Patient language and need for interpreter reviewed:: Yes Do you feel safe going back to the place where you live?: Yes      Need for Family Participation in Patient Care: Yes (Comment) Care giver support system in place?: No (comment) Current home services: DME, Home RN (RN via insurance company visits once/mos, WUJ:[id number], w/c, shower chair, Product manager,) Criminal Activity/Legal Involvement Pertinent to Current Situation/Hospitalization: No - Comment as needed  Activities of Daily Living   ADL Screening (condition at time of admission) Independently performs ADLs?: Yes (appropriate for developmental age) Is the patient deaf or have difficulty hearing?: No Does the patient have difficulty seeing, even when wearing glasses/contacts?: No Does the patient have difficulty concentrating, remembering, or making decisions?: No  Permission Sought/Granted Permission sought to share information with : Case Manager                Emotional Assessment Appearance:: Appears stated age Attitude/Demeanor/Rapport: Engaged Affect (typically observed): Pleasant Orientation: : Oriented to Self, Oriented to Place, Oriented to  Time, Oriented to Situation Alcohol / Substance Use: Never Used Psych Involvement: No (comment)  Admission diagnosis:  COPD exacerbation (HCC) [J44.1] Cellulitis of left lower extremity [L03.116] Acute respiratory failure with hypoxia (HCC) [J96.01] COPD with acute exacerbation (HCC) [  J44.1] Atrial fibrillation with RVR (HCC) [I48.91] Cellulitis [L03.90] Patient Active Problem List   Diagnosis Date Noted   Cellulitis 03/17/2023   Acute on chronic respiratory failure with hypoxia (HCC) 12/03/2022   Iron deficiency anemia due to chronic blood loss 10/29/2022   Elevated brain natriuretic peptide (BNP)  level 09/17/2022   Chronic respiratory failure with hypoxia (HCC) 04/30/2022   History of colostomy reversal 12/18/2020   Abnormality of gait 07/04/2020   Colostomy in place [facility]) 06/13/2020   Colostomy, retracted (HCC) 06/13/2020   Labile blood glucose    Supplemental oxygen dependent    Steroid-induced hyperglycemia    Controlled type 2 diabetes mellitus with hyperglycemia, with long-term current use of insulin (HCC)    Hypoalbuminemia due to protein-calorie malnutrition (HCC)    Chronic diastolic congestive heart failure (HCC)    Acute blood loss anemia    Debility 05/25/2020   Sigmoid Diverticulitis with Perforation-- 05/15/2020   Diverticulitis of large intestine with perforation with bleeding    Sepsis Due to Sigmoid Diverticulitis with Perforation 05/14/2020   Acute GI bleeding 05/14/2020   Acute on chronic anemia-Due to Acute Blood Loss 05/14/2020   Acute respiratory failure with hypoxia (HCC) 01/24/2020   Atrial fibrillation with RVR (HCC) 01/23/2020   Anxiety    Uncontrolled type 2 diabetes mellitus with hyperglycemia, with long-term current use of insulin (HCC)    Weakness 12/10/2019   Acute pain of left knee 08/31/2019   History of DVT (deep vein thrombosis)    Coronary artery disease of native artery of native heart with stable angina pectoris (HCC)    COPD GOLD 0 / 02 dep / prob Cor pulmonale    Atrial fibrillation with rapid ventricular response (HCC) 07/13/2019   COPD with acute exacerbation (HCC) 04/04/2018   Comprehensive diabetic foot examination, type 2 DM, encounter for (HCC) 04/04/2018   PAF (paroxysmal atrial fibrillation) (HCC) 04/04/2018   Constipation by delayed colonic transit 10/23/2016   Fatty liver 12/11/2015   GERD without esophagitis 07/14/2015   Hypotension 03/15/2013   Elevated troponin 03/15/2013   VRE (vancomycin resistant enterococcus) culture positive 10/10/2012   Nonsustained ventricular tachycardia (HCC) 10/10/2012   Cellulitis of arm,  right 10/06/2012   Hydronephrosis 09/15/2012   Abdominal mass, right lower quadrant 09/10/2012   Microcytic anemia 08/22/2012   Acute on chronic diastolic heart failure (HCC) 08/06/2012   UTI (urinary tract infection) 07/30/2012   Atrial fibrillation, chronic (HCC) 07/02/2012   Vitamin B12 deficiency 06/28/2012   Sinusitis chronic, frontal 06/28/2012   Morbid obesity (HCC) 06/28/2012   Chronic kidney disease, stage III (moderate) (HCC) 06/28/2012   ARF (acute renal failure) (HCC) 06/27/2012   Long term current use of anticoagulant therapy 06/27/2012   DOE (dyspnea on exertion) 06/27/2012   Adrenal insufficiency (HCC) 01/07/2012   Uncontrolled type 2 diabetes mellitus with hyperglycemia (HCC) 04/19/2011   Chronic diastolic heart failure (HCC) 04/19/2011   GANGLION OF TENDON SHEATH 02/21/2010   Acquired hypothyroidism 12/01/2008   Mixed hyperlipidemia 12/01/2008   Essential hypertension 12/01/2008   Coronary atherosclerosis 12/01/2008   RENAL FAILURE, CHRONIC 12/01/2008   PCP:  Rica Records, FNP Pharmacy:   [facility] [phone] - [place], [place] - [address] [place] [address] Phone: [phone] Fax: [phone]  [facility] - [place], [place] - [address] [facility] [address] [place] [address] [place] [address] Phone: [phone] Fax: [phone]  Social Determinants of Health (SDOH) Social History: SDOH Screenings   Food Insecurity: No Food Insecurity (03/16/2023)  Housing: Low Risk  (03/16/2023)  Transportation Needs: No Transportation Needs (03/16/2023)  Utilities: Not At Risk (03/16/2023)  Alcohol Screen: Low Risk  (10/22/2022)  Depression (PHQ2-9): Low Risk  (10/30/2022)  Recent Concern: Depression (PHQ2-9) - High Risk (10/16/2022)  Financial Resource Strain: Low Risk  (10/22/2022)  Physical Activity: Insufficiently Active (10/22/2022)  Social Connections: Moderately  Integrated (10/22/2022)  Stress: No Stress Concern Present (10/22/2022)  Tobacco Use: Medium Risk (03/16/2023)   SDOH Interventions:     Readmission Risk Interventions    03/17/2023    5:19 PM 12/04/2022   10:26 AM 09/18/2022    8:34 AM  Readmission Risk Prevention Plan  Transportation Screening Complete Complete Complete  HRI or Home Care Consult Complete Complete Complete  Social Work Consult for Recovery Care Planning/Counseling Complete Complete Complete  Palliative Care Screening Not Applicable Not Applicable Not Applicable  Medication Review Oceanographer) Complete Complete Complete

## 2023-03-17 NOTE — Progress Notes (Signed)
PHARMACY - PHYSICIAN COMMUNICATION CRITICAL VALUE ALERT - BLOOD CULTURE IDENTIFICATION (BCID)  Ann [name] is an 77 y.o. female who presented to [facility] on 03/16/2023 with a chief complaint of bacteremia.  Assessment:  1 of 2 sets, both aerobic and anaerobic bottles, Enterococcus faecalis and Staph epidermidis, mecA/C detected  Name of physician (or Provider) Contacted: Dr. Mariea Clonts; Dr. Renold Don  Current antibiotics: Ceftriaxone and doxycycline  Changes to prescribed antibiotics recommended:  Discontinue ceftriaxone and doxycycline Initiate ampicillin 2 g IV q4H   Results for orders placed or performed during the hospital encounter of 03/16/23  Blood Culture ID Panel (Reflexed) (Collected: 03/16/2023  9:38 AM)  Result Value Ref Range   Enterococcus faecalis DETECTED (A) NOT DETECTED   Enterococcus Faecium NOT DETECTED NOT DETECTED   Listeria monocytogenes NOT DETECTED NOT DETECTED   Staphylococcus species DETECTED (A) NOT DETECTED   Staphylococcus aureus (BCID) NOT DETECTED NOT DETECTED   Staphylococcus epidermidis DETECTED (A) NOT DETECTED   Staphylococcus lugdunensis NOT DETECTED NOT DETECTED   Streptococcus species NOT DETECTED NOT DETECTED   Streptococcus agalactiae NOT DETECTED NOT DETECTED   Streptococcus pneumoniae NOT DETECTED NOT DETECTED   Streptococcus pyogenes NOT DETECTED NOT DETECTED   A.calcoaceticus-baumannii NOT DETECTED NOT DETECTED   Bacteroides fragilis NOT DETECTED NOT DETECTED   Enterobacterales NOT DETECTED NOT DETECTED   Enterobacter cloacae complex NOT DETECTED NOT DETECTED   Escherichia coli NOT DETECTED NOT DETECTED   Klebsiella aerogenes NOT DETECTED NOT DETECTED   Klebsiella oxytoca NOT DETECTED NOT DETECTED   Klebsiella pneumoniae NOT DETECTED NOT DETECTED   Proteus species NOT DETECTED NOT DETECTED   Salmonella species NOT DETECTED NOT DETECTED   Serratia marcescens NOT DETECTED NOT DETECTED   Haemophilus influenzae NOT DETECTED NOT DETECTED    Neisseria meningitidis NOT DETECTED NOT DETECTED   Pseudomonas aeruginosa NOT DETECTED NOT DETECTED   Stenotrophomonas maltophilia NOT DETECTED NOT DETECTED   Candida albicans NOT DETECTED NOT DETECTED   Candida auris NOT DETECTED NOT DETECTED   Candida glabrata NOT DETECTED NOT DETECTED   Candida krusei NOT DETECTED NOT DETECTED   Candida parapsilosis NOT DETECTED NOT DETECTED   Candida tropicalis NOT DETECTED NOT DETECTED   Cryptococcus neoformans/gattii NOT DETECTED NOT DETECTED   Methicillin resistance mecA/C DETECTED (A) NOT DETECTED   Vancomycin resistance NOT DETECTED NOT DETECTED    Orson Aloe 03/17/2023  4:01 PM

## 2023-03-17 NOTE — Plan of Care (Signed)
  Problem: Education: Goal: Ability to describe self-care measures that may prevent or decrease complications (Diabetes Survival Skills Education) will improve Outcome: Progressing Goal: Individualized Educational Video(s) Outcome: Progressing   Problem: Coping: Goal: Ability to adjust to condition or change in health will improve Outcome: Progressing   

## 2023-03-17 NOTE — Progress Notes (Signed)
PROGRESS NOTE     Ann [name], is a 77 y.o. female, DOB - 08-Sep-1945, ONG:[id number]  Admit date - 03/16/2023   Admitting Physician Tia Gelb Mariea Clonts, MD  Outpatient Primary MD for the patient is Del Newman Nip, Tenna Child, FNP  LOS - 0  Chief Complaint  Patient presents with   Respiratory Distress      Brief Narrative:  77 y.o. female with past medical history relevant for COPD, CAD, status post prior stent, atrial fibrillation, HTN, GERD, chronic anticoagulation, prior history of DVT DM2  and  chronic hypoxic respiratory failure on 2 L of oxygen presents to the ED with worsening shortness of breath on 2 L in the ED she was 84% on room air she required BiPAP initially, also complains of lower extremity redness with history of prior cellulitis  -Admitted on 03/16/2023 with concerns for cellulitis decubitus exacerbation -Preliminary blood cultures Now growing GPC    -Assessment and Plan: 1)acute COPD exacerbation---COVID,  flu and RSV negative --Patient received IV steroids bronchodilators and BiPAP in the ED and respiratory status improved with eventually weaned off BiPAP  -Respiratory status continues to improve currently on 4 L of oxygen by nasal cannula -Continue IV Solu-Medrol, bronchodilators, mucolytic's and doxycycline   2)Bilateral lower extremity cellulitis----blood cultures with GPC continue Rocephin and doxycycline pending further culture data   3)GPC Bacteremia ----Preliminary blood cultures from 03/16/2023 now growing GPC -Continue Rocephin pending further culture data   4) chronic Anemia--- Hgb currently around 10 which is close to baseline, watch closely while on Eliquis   5)History of prior DVT/A-fib---  -Continue bisoprolol for rate control =-continue Eliquis for anticoagulation   6)H/o CAD----history of prior angioplasty and stenting - EKG is atrial fibrillation with PVCs -Continue atorvastatin and metoprolol for secondary prevention --Troponin 20 repeat  17   7) chronic diastolic dysfunction CHF---- Chest-X-ray with cardiomegaly without acute findings --BNP 344 -Continue PTA Lasix   8)DM2-recent A1c 7.3 reflecting uncontrolled DM with hyperglycemia PTA -Worsening hyperglycemia due to steroids noted -Add Semglee 6 units daily --Use Novolog/Humalog Sliding scale insulin with Accu-Cheks/Fingersticks as ordered    9)Hypomagnesemia/hypokalemia--- replace and recheck   Status is: Inpatient   Disposition: The patient is from: Home              Anticipated d/c is to: Home              Anticipated d/c date is: 2 days              Patient currently is not medically stable to d/c. Barriers: Not Clinically Stable-   Code Status :  -  Code Status: Limited: Do not attempt resuscitation (DNR) -DNR-LIMITED -Do Not Intubate/DNI    Family Communication:    NA (patient is alert, awake and coherent)   DVT Prophylaxis  :   - SCDs  SCDs Start: 03/16/23 1157 Place TED hose Start: 03/16/23 1157 apixaban (ELIQUIS) tablet 5 mg   Lab Results  Component Value Date   PLT 183 03/16/2023    Inpatient Medications  Scheduled Meds:  apixaban  5 mg Oral BID   atorvastatin  80 mg Oral q1800   bisoprolol  5 mg Oral Daily   cyanocobalamin  1,000 mcg Oral Daily   diltiazem  120 mg Oral Daily   doxycycline  100 mg Oral Q12H   ferrous sulfate  325 mg Oral Q breakfast   furosemide  40 mg Oral BID   gabapentin  400 mg Oral TID   Gerhardt's butt cream  Topical BID   glipiZIDE  5 mg Oral Q breakfast   insulin aspart  0-15 Units Subcutaneous TID WC   insulin aspart  0-5 Units Subcutaneous QHS   insulin aspart  3 Units Subcutaneous TID WC   ipratropium-albuterol  3 mL Nebulization QID   levothyroxine  175 mcg Oral Q0600   methylPREDNISolone (SOLU-MEDROL) injection  40 mg Intravenous Q12H   mirabegron ER  25 mg Oral Daily   pantoprazole  40 mg Oral Daily   predniSONE  20 mg Oral Q breakfast   sodium chloride flush  10 mL Intravenous Q12H   sodium  chloride flush  3 mL Intravenous Q12H   sodium chloride flush  3 mL Intravenous Q12H   Continuous Infusions:  cefTRIAXone (ROCEPHIN)  IV     PRN Meds:.acetaminophen **OR** acetaminophen, albuterol, bisacodyl, guaiFENesin-dextromethorphan, nitroGLYCERIN, ondansetron **OR** ondansetron (ZOFRAN) IV, polyethylene glycol, sodium chloride flush, traZODone   Anti-infectives (From admission, onward)    Start     Dose/Rate Route Frequency Ordered Stop   03/17/23 1000  cefTRIAXone (ROCEPHIN) 1 g in sodium chloride 0.9 % 100 mL IVPB        1 g 200 mL/hr over 30 Minutes Intravenous Every 24 hours 03/16/23 1159     03/16/23 1230  doxycycline (VIBRA-TABS) tablet 100 mg        100 mg Oral Every 12 hours 03/16/23 1159     03/16/23 1145  azithromycin (ZITHROMAX) 500 mg in sodium chloride 0.9 % 250 mL IVPB        500 mg 250 mL/hr over 60 Minutes Intravenous  Once 03/16/23 1130 03/16/23 1303   03/16/23 0945  cefTRIAXone (ROCEPHIN) 2 g in sodium chloride 0.9 % 100 mL IVPB        2 g 200 mL/hr over 30 Minutes Intravenous  Once 03/16/23 0935 03/16/23 1100      Subjective: Ann [name] today has no fevers, no emesis,  No chest pain,   - Patient reports improving leg pain  -Reports worsening cough  Objective: Vitals:   03/17/23 0614 03/17/23 0703 03/17/23 0808 03/17/23 1011  BP: 128/88  (!) 140/93 133/89  Pulse: (!) 130  89 (!) 48  Resp: 16   16  Temp: 97.8 F (36.6 C)  97.9 F (36.6 C) 98.2 F (36.8 C)  TempSrc:   Oral Oral  SpO2: 95% 96% 92% (!) 87%  Weight:      Height:        Intake/Output Summary (Last 24 hours) at 03/17/2023 1127 Last data filed at 03/17/2023 0600 Gross per 24 hour  Intake 1930.47 ml  Output 1000 ml  Net 930.47 ml   Filed Weights   03/16/23 0925  Weight: 128.8 kg   Physical Exam Gen:- Awake Alert, in no apparent distress  HEENT:- [place].AT, No sclera icterus Nose- [place] 4L/min Neck-Supple Neck,No JVD,.  Lungs-  CTAB , fair symmetrical air movement CV- S1,  S2 normal, regular  Abd-  +ve B.Sounds, Abd Soft, No tenderness,    Extremity/Skin- pedal pulses present, swelling, erythema, warmth and tenderness noted--please see photos in epic Psych-affect is appropriate, oriented x3 Neuro-no new focal deficits, no tremors  Data Reviewed: I have personally reviewed following labs and imaging studies  CBC: Recent Labs  Lab 03/16/23 0958  WBC 7.9  NEUTROABS 5.4  HGB 10.0*  HCT 32.0*  MCV 97.9  PLT 183   Basic Metabolic Panel: Recent Labs  Lab 03/16/23 0958  NA 140  K 3.4*  CL 103  CO2  27  GLUCOSE 144*  BUN 9  CREATININE 0.70  CALCIUM 8.7*  MG 1.6*   GFR: Estimated Creatinine Clearance: 77.2 mL/min (by C-G formula based on SCr of 0.7 mg/dL).  BNP (last 3 results) Recent Labs    10/14/22 1042  PROBNP 2,295*   Recent Results (from the past 240 hour(s))  Culture, blood (Routine x 2)     Status: None (Preliminary result)   Collection Time: 03/16/23  9:38 AM   Specimen: Right Antecubital; Blood  Result Value Ref Range Status   Specimen Description   Final    RIGHT ANTECUBITAL BLOOD Performed at [facility], [address] [place], [place] [address]    Special Requests   Final    Blood Culture adequate volume BOTTLES DRAWN AEROBIC AND ANAEROBIC Performed at [facility], [address] [place], [place] [address]    Culture  Setup Time   Final    GRAM POSITIVE COCCI IN BOTH AEROBIC AND ANAEROBIC BOTTLES CRITICAL RESULT CALLED TO, READ BACK BY AND VERIFIED WITH: Macky Lower AT 4403 03/17/2023 BY TFK Performed at [facility], [address], [place], [place] [address]    Culture [facility] POSITIVE COCCI  Final   Report Status PENDING  Incomplete  Culture, blood (Routine x 2)     Status: None (Preliminary result)   Collection Time: 03/16/23  9:58 AM   Specimen: Left Antecubital; Blood  Result Value Ref Range Status   Specimen Description LEFT ANTECUBITAL AEROBIC BOTTLE ONLY  Final   Special Requests   Final     Blood Culture results may not be optimal due to an excessive volume of blood received in culture bottles   Culture   Final    NO GROWTH < 24 HOURS Performed at [facility], [address], [place], [place] [address]    Report Status PENDING  Incomplete  Resp panel by RT-PCR (RSV, Flu A&B, Covid) Anterior Nasal Swab     Status: None   Collection Time: 03/16/23  1:03 PM   Specimen: Anterior Nasal Swab  Result Value Ref Range Status   SARS Coronavirus 2 by RT PCR NEGATIVE NEGATIVE Final    Comment: (NOTE) SARS-CoV-2 target nucleic acids are NOT DETECTED.  The SARS-CoV-2 RNA is generally detectable in upper respiratory specimens during the acute phase of infection. The lowest concentration of SARS-CoV-2 viral copies this assay can detect is 138 copies/mL. A negative result does not preclude SARS-Cov-2 infection and should not be used as the sole basis for treatment or other patient management decisions. A negative result may occur with  improper specimen collection/handling, submission of specimen other than nasopharyngeal swab, presence of viral mutation(s) within the areas targeted by this assay, and inadequate number of viral copies(<138 copies/mL). A negative result must be combined with clinical observations, patient history, and epidemiological information. The expected result is Negative.  Fact Sheet for Patients:  BloggerCourse.com  Fact Sheet for Healthcare Providers:  SeriousBroker.it  This test is no t yet approved or cleared by the [place] FDA and  has been authorized for detection and/or diagnosis of SARS-CoV-2 by FDA under an Emergency Use Authorization (EUA). This EUA will remain  in effect (meaning this test can be used) for the duration of the COVID-19 declaration under Section 564(b)(1) of the Act, 21 U.S.C.section 360bbb-3(b)(1), unless the authorization is terminated  or revoked sooner.       Influenza A  by PCR NEGATIVE NEGATIVE Final   Influenza B by PCR  NEGATIVE NEGATIVE Final    Comment: (NOTE) The Xpert Xpress SARS-CoV-2/FLU/RSV plus assay is intended as an aid in the diagnosis of influenza from Nasopharyngeal swab specimens and should not be used as a sole basis for treatment. Nasal washings and aspirates are unacceptable for Xpert Xpress SARS-CoV-2/FLU/RSV testing.  Fact Sheet for Patients: BloggerCourse.com  Fact Sheet for Healthcare Providers: SeriousBroker.it  This test is not yet approved or cleared by the [place] FDA and has been authorized for detection and/or diagnosis of SARS-CoV-2 by FDA under an Emergency Use Authorization (EUA). This EUA will remain in effect (meaning this test can be used) for the duration of the COVID-19 declaration under Section 564(b)(1) of the Act, 21 U.S.C. section 360bbb-3(b)(1), unless the authorization is terminated or revoked.     Resp Syncytial Virus by PCR NEGATIVE NEGATIVE Final    Comment: (NOTE) Fact Sheet for Patients: BloggerCourse.com  Fact Sheet for Healthcare Providers: SeriousBroker.it  This test is not yet approved or cleared by the [place] FDA and has been authorized for detection and/or diagnosis of SARS-CoV-2 by FDA under an Emergency Use Authorization (EUA). This EUA will remain in effect (meaning this test can be used) for the duration of the COVID-19 declaration under Section 564(b)(1) of the Act, 21 U.S.C. section 360bbb-3(b)(1), unless the authorization is terminated or revoked.  Performed at [facility], [address], [place], [place] [address]     Radiology Studies: [facility] 1 View  Result Date: 03/16/2023 CLINICAL DATA:  Shortness of breath and hypoxia. Lower extremity cellulitis. EXAM: PORTABLE CHEST 1 VIEW COMPARISON:  12/16/2022 FINDINGS: Stable cardiomegaly and scarring in left  midlung. No evidence of acute infiltrate or edema. Cervical spine fusion hardware noted. IMPRESSION: Stable cardiomegaly and left midlung scarring. No active disease. Electronically Signed   By: Danae Orleans M.D.   On: 03/16/2023 10:37     Scheduled Meds:  apixaban  5 mg Oral BID   atorvastatin  80 mg Oral q1800   bisoprolol  5 mg Oral Daily   cyanocobalamin  1,000 mcg Oral Daily   diltiazem  120 mg Oral Daily   doxycycline  100 mg Oral Q12H   ferrous sulfate  325 mg Oral Q breakfast   furosemide  40 mg Oral BID   gabapentin  400 mg Oral TID   Gerhardt's butt cream   Topical BID   glipiZIDE  5 mg Oral Q breakfast   insulin aspart  0-15 Units Subcutaneous TID WC   insulin aspart  0-5 Units Subcutaneous QHS   insulin aspart  3 Units Subcutaneous TID WC   ipratropium-albuterol  3 mL Nebulization QID   levothyroxine  175 mcg Oral Q0600   methylPREDNISolone (SOLU-MEDROL) injection  40 mg Intravenous Q12H   mirabegron ER  25 mg Oral Daily   pantoprazole  40 mg Oral Daily   predniSONE  20 mg Oral Q breakfast   sodium chloride flush  10 mL Intravenous Q12H   sodium chloride flush  3 mL Intravenous Q12H   sodium chloride flush  3 mL Intravenous Q12H   Continuous Infusions:  cefTRIAXone (ROCEPHIN)  IV       LOS: 0 days    Shon Hale M.D on 03/17/2023 at 11:27 AM  Go to [email or web address] - for contact info  [facility] - Office  [phone]  If 7PM-7AM, please contact night-coverage [email or web address] 03/17/2023, 11:27 AM

## 2023-03-17 NOTE — Inpatient Diabetes Management (Signed)
Inpatient Diabetes Program Recommendations  AACE/ADA: New Consensus Statement on Inpatient Glycemic Control   Target Ranges:  Prepandial:   less than 140 mg/dL      Peak postprandial:   less than 180 mg/dL (1-2 hours)      Critically ill patients:  140 - 180 mg/dL    Latest Reference Range & Units 03/16/23 12:17 03/16/23 16:19 03/16/23 20:48 03/17/23 07:23 03/17/23 11:05  Glucose-Capillary 70 - 99 mg/dL 130 (H) 865 (H) 784 (H) 237 (H) 261 (H)   Review of Glycemic Control  Diabetes history: DM2 Outpatient Diabetes medications: Glipizide XL 5 mg QAM, Humalog 75/25 40-50 units BID Current orders for Inpatient glycemic control: Glipizide XL 5 mg QAM, Novolog 0-15 units TID with meals, Novolog 0-5 units at bedtime, Novolog 3 units TID with meals; Solumedrol 40 mg Q12H, Prednisone 20 mg QAM  Inpatient Diabetes Program Recommendations:    Insulin: If Solumedrol is continued, please consider ordering Semglee 6 units Q24H.  Thanks, Orlando Penner, RN, MSN, CDCES Diabetes Coordinator Inpatient Diabetes Program [phone] (Team Pager from 8am to 5pm)

## 2023-03-18 DIAGNOSIS — J441 Chronic obstructive pulmonary disease with (acute) exacerbation: Secondary | ICD-10-CM | POA: Diagnosis not present

## 2023-03-18 LAB — CBC
HCT: 39.6 % (ref 36.0–46.0)
Hemoglobin: 11.9 g/dL — ABNORMAL LOW (ref 12.0–15.0)
MCH: 29.3 pg (ref 26.0–34.0)
MCHC: 30.1 g/dL (ref 30.0–36.0)
MCV: 97.5 fL (ref 80.0–100.0)
Platelets: 228 10*3/uL (ref 150–400)
RBC: 4.06 MIL/uL (ref 3.87–5.11)
RDW: 13.2 % (ref 11.5–15.5)
WBC: 12.2 10*3/uL — ABNORMAL HIGH (ref 4.0–10.5)
nRBC: 0 % (ref 0.0–0.2)

## 2023-03-18 LAB — GLUCOSE, CAPILLARY
Glucose-Capillary: 257 mg/dL — ABNORMAL HIGH (ref 70–99)
Glucose-Capillary: 323 mg/dL — ABNORMAL HIGH (ref 70–99)
Glucose-Capillary: 332 mg/dL — ABNORMAL HIGH (ref 70–99)
Glucose-Capillary: 354 mg/dL — ABNORMAL HIGH (ref 70–99)

## 2023-03-18 LAB — BASIC METABOLIC PANEL
Anion gap: 9 (ref 5–15)
BUN: 21 mg/dL (ref 8–23)
CO2: 29 mmol/L (ref 22–32)
Calcium: 8.5 mg/dL — ABNORMAL LOW (ref 8.9–10.3)
Chloride: 97 mmol/L — ABNORMAL LOW (ref 98–111)
Creatinine, Ser: 0.88 mg/dL (ref 0.44–1.00)
GFR, Estimated: >60 mL/min (ref >60)
Glucose, Bld: 261 mg/dL — ABNORMAL HIGH (ref 70–99)
Potassium: 4.3 mmol/L (ref 3.5–5.1)
Sodium: 135 mmol/L (ref 135–145)

## 2023-03-18 MED ORDER — METHYLPREDNISOLONE SODIUM SUCC 40 MG IJ SOLR
40.0000 mg | Freq: Two times a day (BID) | INTRAMUSCULAR | Status: AC
  Administered 2023-03-18: 40 mg via INTRAVENOUS
  Filled 2023-03-18: qty 1

## 2023-03-18 MED ORDER — PREDNISONE 20 MG PO TABS
40.0000 mg | ORAL_TABLET | Freq: Every day | ORAL | Status: DC
  Administered 2023-03-19: 40 mg via ORAL
  Filled 2023-03-18: qty 2

## 2023-03-18 MED ORDER — HYDROCORTISONE VALERATE 0.2 % EX CREA
TOPICAL_CREAM | Freq: Two times a day (BID) | CUTANEOUS | Status: DC
  Filled 2023-03-18: qty 15

## 2023-03-18 MED ORDER — PHENOL 1.4 % MT LIQD
1.0000 | OROMUCOSAL | Status: DC | PRN
  Administered 2023-03-18: 1 via OROMUCOSAL
  Filled 2023-03-18: qty 177

## 2023-03-18 NOTE — Progress Notes (Signed)
   03/18/23 2047  Vitals  Temp 97.7 F (36.5 C)  Temp Source Oral  BP (!) 143/101  MAP (mmHg) 112  BP Location Left Arm  BP Method Automatic  Patient Position (if appropriate) Lying  Pulse Rate (!) 117  Pulse Rate Source Dinamap  Resp 18  MEWS COLOR  MEWS Score Color Yellow  Oxygen Therapy  SpO2 93 %  O2 Device Nasal Cannula  MEWS Score  MEWS Temp 0  MEWS Systolic 0  MEWS Pulse 2  MEWS RR 0  MEWS LOC 0  MEWS Score 2  Provider Notification  Provider Name/Title Thomes Dinning, DO  Date Provider Notified 03/18/23  Time Provider Notified 2107  Method of Notification  (Secure chat)  Notification Reason Change in status;Other (Comment) (BP elevated, HR elevated)  Provider response Other (Comment) (Check BP manual)  Date of Provider Response 03/18/23  Time of Provider Response 2114    BP elevated, HR elevated. Adefeso, DO notified. Received order to do manual BP check.

## 2023-03-18 NOTE — Progress Notes (Signed)
PROGRESS NOTE  Ann [name], is a 77 y.o. female, DOB - 05-29-1945, NGE:[id number]  Admit date - 03/16/2023   Admitting Physician Hinton Luellen Mariea Clonts, MD  Outpatient Primary MD for the patient is Del Newman Nip, Tenna Child, FNP  LOS - 1  Chief Complaint  Patient presents with   Respiratory Distress      Brief Narrative:  77 y.o. female with past medical history relevant for COPD, CAD, status post prior stent, atrial fibrillation, HTN, GERD, chronic anticoagulation, prior history of DVT DM2  and  chronic hypoxic respiratory failure on 2 L of oxygen presents to the ED with worsening shortness of breath on 2 L in the ED she was 84% on room air she required BiPAP initially, also complains of lower extremity redness with history of prior cellulitis  -Admitted on 03/16/2023 with concerns for cellulitis decubitus exacerbation -Preliminary blood cultures Now growing GPC    -Assessment and Plan: 1)acute COPD exacerbation---COVID,  flu and RSV negative --Patient received IV steroids bronchodilators and BiPAP in the ED and respiratory status improved with eventually weaned off BiPAP  -Respiratory status continues to improve currently on 4 L of oxygen by nasal cannula -Continue IV Solu-Medrol, bronchodilators, mucolytic's and doxycycline   2)Bilateral lower extremity cellulitis----blood cultures and antibiotic choices as below #3 -Topical steroids for presumed chronic venous stasis dermatitis  3) Enterococcus faecalis bacteremia ----Preliminary blood cultures from 03/16/2023 with E faecalis and coag negative staph -Coag negative staph is probably contaminant -Faecalis is probably contaminant,  -Per ED physician stop IV Rocephin -Start ampicillin  -Repeat blood cultures on 03/17/2023 -If repeat blood cultures from 03/17/2023 still negative on 03/19/2023 can switch to amoxicillin 1 g 3 times daily renally adjusted to finish 14 days of treatment -If repeat blood cultures from 03/17/2023 continues to  show E faecalis then patient will need echo/endocarditis workup -ID consult appreciated -Afebrile, WBC 7.9 >> 12.2   4) chronic Anemia--- Hgb currently around 10 which is close to baseline, watch closely while on Eliquis   5)History of prior DVT/A-fib---  -Continue bisoprolol for rate control =-continue Eliquis for anticoagulation   6)H/o CAD----history of prior angioplasty and stenting - EKG is atrial fibrillation with PVCs -Continue atorvastatin and metoprolol for secondary prevention --Troponin 20 repeat 17   7) chronic diastolic dysfunction CHF---- Chest-X-ray with cardiomegaly without acute findings --BNP 344 -Continue PTA Lasix   8)DM2-recent A1c 7.3 reflecting uncontrolled DM with hyperglycemia PTA -Worsening hyperglycemia due to steroids noted -Add Semglee 6 units daily --Use Novolog/Humalog Sliding scale insulin with Accu-Cheks/Fingersticks as ordered    9)Hypomagnesemia/hypokalemia--- replace and recheck   Status is: Inpatient   Disposition: The patient is from: Home              Anticipated d/c is to: Home              Anticipated d/c date is: 2 days              Patient currently is not medically stable to d/c. Barriers: Not Clinically Stable-   Code Status :  -  Code Status: Limited: Do not attempt resuscitation (DNR) -DNR-LIMITED -Do Not Intubate/DNI    Family Communication:     (patient is alert, awake and coherent)  Discussed with husband and son at bedside  DVT Prophylaxis  :   - SCDs  SCDs Start: 03/16/23 1157 Place TED hose Start: 03/16/23 1157 apixaban (ELIQUIS) tablet 5 mg   Lab Results  Component Value Date   PLT 228 03/18/2023   Inpatient  Medications  Scheduled Meds:  apixaban  5 mg Oral BID   atorvastatin  80 mg Oral q1800   bisoprolol  5 mg Oral Daily   cyanocobalamin  1,000 mcg Oral Daily   dextromethorphan-guaiFENesin  1 tablet Oral BID   diltiazem  120 mg Oral Daily   ferrous sulfate  325 mg Oral Q breakfast   furosemide  40 mg  Oral BID   gabapentin  400 mg Oral TID   Gerhardt's butt cream   Topical BID   glipiZIDE  5 mg Oral Q breakfast   hydrocortisone valerate cream   Topical BID   insulin aspart  0-15 Units Subcutaneous TID WC   insulin aspart  0-5 Units Subcutaneous QHS   insulin aspart  3 Units Subcutaneous TID WC   insulin glargine-yfgn  6 Units Subcutaneous Daily   ipratropium-albuterol  3 mL Nebulization QID   levothyroxine  175 mcg Oral Q0600   mirabegron ER  25 mg Oral Daily   pantoprazole  40 mg Oral Daily   [START ON 03/19/2023] predniSONE  40 mg Oral Q breakfast   sodium chloride flush  10 mL Intravenous Q12H   sodium chloride flush  3 mL Intravenous Q12H   sodium chloride flush  3 mL Intravenous Q12H   Continuous Infusions:  ampicillin (OMNIPEN) IV 2 g (03/18/23 1542)   PRN Meds:.acetaminophen **OR** acetaminophen, albuterol, bisacodyl, nitroGLYCERIN, ondansetron **OR** ondansetron (ZOFRAN) IV, phenol, polyethylene glycol, sodium chloride, sodium chloride flush, traZODone   Anti-infectives (From admission, onward)    Start     Dose/Rate Route Frequency Ordered Stop   03/17/23 1600  ampicillin (OMNIPEN) 2 g in sodium chloride 0.9 % 100 mL IVPB        2 g 300 mL/hr over 20 Minutes Intravenous Every 4 hours 03/17/23 1417     03/17/23 1000  cefTRIAXone (ROCEPHIN) 1 g in sodium chloride 0.9 % 100 mL IVPB  Status:  Discontinued        1 g 200 mL/hr over 30 Minutes Intravenous Every 24 hours 03/16/23 1159 03/17/23 1422   03/16/23 1230  doxycycline (VIBRA-TABS) tablet 100 mg  Status:  Discontinued        100 mg Oral Every 12 hours 03/16/23 1159 03/17/23 1422   03/16/23 1145  azithromycin (ZITHROMAX) 500 mg in sodium chloride 0.9 % 250 mL IVPB        500 mg 250 mL/hr over 60 Minutes Intravenous  Once 03/16/23 1130 03/16/23 1303   03/16/23 0945  cefTRIAXone (ROCEPHIN) 2 g in sodium chloride 0.9 % 100 mL IVPB        2 g 200 mL/hr over 30 Minutes Intravenous  Once 03/16/23 0935 03/16/23 1100       Subjective: Marykay Lex today has no fevers, no emesis,  No chest pain,   - Husband and son at bedside -Patient states leg discomfort is better  Objective: Vitals:   03/17/23 2156 03/18/23 0414 03/18/23 0720 03/18/23 1100  BP: (!) 129/93 (!) 148/104    Pulse: (!) 103 98    Resp: 20 16    Temp: 98.4 F (36.9 C) 97.7 F (36.5 C)    TempSrc:  Oral    SpO2: 97% 95% 96% 96%  Weight:      Height:        Intake/Output Summary (Last 24 hours) at 03/18/2023 2011 Last data filed at 03/18/2023 0504 Gross per 24 hour  Intake 200 ml  Output 800 ml  Net -600 ml   Ceasar Mons  Weights   03/16/23 0925  Weight: 128.8 kg   Physical Exam Gen:- Awake Alert, in no apparent distress  HEENT:- [place].AT, No sclera icterus Nose- [place] 4L/min Neck-Supple Neck,No JVD,.  Lungs-  CTAB , fair symmetrical air movement CV- S1, S2 normal, regular  Abd-  +ve B.Sounds, Abd Soft, No tenderness, increased truncal adiposity Extremity/Skin- pedal pulses present, swelling, erythema, warmth and tenderness noted--please see photos in epic Psych-affect is appropriate, oriented x3 Neuro-no new focal deficits, no tremors  Data Reviewed: I have personally reviewed following labs and imaging studies  CBC: Recent Labs  Lab 03/16/23 0958 03/18/23 0421  WBC 7.9 12.2*  NEUTROABS 5.4  --   HGB 10.0* 11.9*  HCT 32.0* 39.6  MCV 97.9 97.5  PLT 183 228   Basic Metabolic Panel: Recent Labs  Lab 03/16/23 0958 03/18/23 0421  NA 140 135  K 3.4* 4.3  CL 103 97*  CO2 27 29  GLUCOSE 144* 261*  BUN 9 21  CREATININE 0.70 0.88  CALCIUM 8.7* 8.5*  MG 1.6*  --    GFR: Estimated Creatinine Clearance: 70.1 mL/min (by C-G formula based on SCr of 0.88 mg/dL).  BNP (last 3 results) Recent Labs    10/14/22 1042  PROBNP 2,295*   Recent Results (from the past 240 hour(s))  Culture, blood (Routine x 2)     Status: Abnormal (Preliminary result)   Collection Time: 03/16/23  9:38 AM   Specimen: Right Antecubital;  Blood  Result Value Ref Range Status   Specimen Description RIGHT ANTECUBITAL BLOOD  Final   Special Requests   Final    Blood Culture adequate volume BOTTLES DRAWN AEROBIC AND ANAEROBIC   Culture  Setup Time   Final    GRAM POSITIVE COCCI IN BOTH AEROBIC AND ANAEROBIC BOTTLES CRITICAL RESULT CALLED TO, READ BACK BY AND VERIFIED WITH: BROOKE BROWN AT 2956 03/17/2023 BY TFK CRITICAL RESULT CALLED TO, READ BACK BY AND VERIFIED WITH: PHARMD S HURTH [id number] AT 1304 BY CM    Culture (A)  Final    ENTEROCOCCUS FAECALIS STAPHYLOCOCCUS HOMINIS CULTURE REINCUBATED FOR BETTER GROWTH SUSCEPTIBILITIES TO FOLLOW Performed at [facility], [address] [place], [place] [address]    Report Status PENDING  Incomplete  Blood Culture ID Panel (Reflexed)     Status: Abnormal   Collection Time: 03/16/23  9:38 AM  Result Value Ref Range Status   Enterococcus faecalis DETECTED (A) NOT DETECTED Final    Comment: CRITICAL RESULT CALLED TO, READ BACK BY AND VERIFIED WITH: PHARMD S HURTH [id number] AT 1315 BY CM    Enterococcus Faecium NOT DETECTED NOT DETECTED Final   Listeria monocytogenes NOT DETECTED NOT DETECTED Final   Staphylococcus species DETECTED (A) NOT DETECTED Final    Comment: CRITICAL RESULT CALLED TO, READ BACK BY AND VERIFIED WITH: PHARMD S HURTS [id number] AT 1315 BY CM    Staphylococcus aureus (BCID) NOT DETECTED NOT DETECTED Final   Staphylococcus epidermidis DETECTED (A) NOT DETECTED Final    Comment: Methicillin (oxacillin) resistant coagulase negative staphylococcus. Possible blood culture contaminant (unless isolated from more than one blood culture draw or clinical case suggests pathogenicity). No antibiotic treatment is indicated for blood  culture contaminants. CRITICAL RESULT CALLED TO, READ BACK BY AND VERIFIED WITH: PHARMD S HURTH [id number] AT 1315 BY CM    Staphylococcus lugdunensis NOT DETECTED NOT DETECTED Final   Streptococcus species NOT DETECTED NOT DETECTED Final    Streptococcus agalactiae NOT DETECTED NOT DETECTED Final   Streptococcus  pneumoniae NOT DETECTED NOT DETECTED Final   Streptococcus pyogenes NOT DETECTED NOT DETECTED Final   A.calcoaceticus-baumannii NOT DETECTED NOT DETECTED Final   Bacteroides fragilis NOT DETECTED NOT DETECTED Final   Enterobacterales NOT DETECTED NOT DETECTED Final   Enterobacter cloacae complex NOT DETECTED NOT DETECTED Final   Escherichia coli NOT DETECTED NOT DETECTED Final   Klebsiella aerogenes NOT DETECTED NOT DETECTED Final   Klebsiella oxytoca NOT DETECTED NOT DETECTED Final   Klebsiella pneumoniae NOT DETECTED NOT DETECTED Final   Proteus species NOT DETECTED NOT DETECTED Final   Salmonella species NOT DETECTED NOT DETECTED Final   Serratia marcescens NOT DETECTED NOT DETECTED Final   Haemophilus influenzae NOT DETECTED NOT DETECTED Final   Neisseria meningitidis NOT DETECTED NOT DETECTED Final   Pseudomonas aeruginosa NOT DETECTED NOT DETECTED Final   Stenotrophomonas maltophilia NOT DETECTED NOT DETECTED Final   Candida albicans NOT DETECTED NOT DETECTED Final   Candida auris NOT DETECTED NOT DETECTED Final   Candida glabrata NOT DETECTED NOT DETECTED Final   Candida krusei NOT DETECTED NOT DETECTED Final   Candida parapsilosis NOT DETECTED NOT DETECTED Final   Candida tropicalis NOT DETECTED NOT DETECTED Final   Cryptococcus neoformans/gattii NOT DETECTED NOT DETECTED Final   Methicillin resistance mecA/C DETECTED (A) NOT DETECTED Final    Comment: CRITICAL RESULT CALLED TO, READ BACK BY AND VERIFIED WITH: PHARMD S HURTH [id number] AT 1315 BY CM    Vancomycin resistance NOT DETECTED NOT DETECTED Final    Comment: Performed at [facility], [address], [place], [place] [address]  Culture, blood (Routine x 2)     Status: None (Preliminary result)   Collection Time: 03/16/23  9:58 AM   Specimen: Left Antecubital; Blood  Result Value Ref Range Status   Specimen Description LEFT ANTECUBITAL  AEROBIC BOTTLE ONLY  Final   Special Requests   Final    Blood Culture results may not be optimal due to an excessive volume of blood received in culture bottles   Culture   Final    NO GROWTH 2 DAYS Performed at [facility], [address], [place], [place] [address]    Report Status PENDING  Incomplete  Resp panel by RT-PCR (RSV, Flu A&B, Covid) Anterior Nasal Swab     Status: None   Collection Time: 03/16/23  1:03 PM   Specimen: Anterior Nasal Swab  Result Value Ref Range Status   SARS Coronavirus 2 by RT PCR NEGATIVE NEGATIVE Final    Comment: (NOTE) SARS-CoV-2 target nucleic acids are NOT DETECTED.  The SARS-CoV-2 RNA is generally detectable in upper respiratory specimens during the acute phase of infection. The lowest concentration of SARS-CoV-2 viral copies this assay can detect is 138 copies/mL. A negative result does not preclude SARS-Cov-2 infection and should not be used as the sole basis for treatment or other patient management decisions. A negative result may occur with  improper specimen collection/handling, submission of specimen other than nasopharyngeal swab, presence of viral mutation(s) within the areas targeted by this assay, and inadequate number of viral copies(<138 copies/mL). A negative result must be combined with clinical observations, patient history, and epidemiological information. The expected result is Negative.  Fact Sheet for Patients:  BloggerCourse.com  Fact Sheet for Healthcare Providers:  SeriousBroker.it  This test is no t yet approved or cleared by the [place] FDA and  has been authorized for detection and/or diagnosis of SARS-CoV-2 by FDA under an Emergency Use Authorization (EUA). This EUA will remain  in effect (meaning this test can be used) for the duration of the COVID-19 declaration under Section 564(b)(1) of the Act, 21 U.S.C.section 360bbb-3(b)(1), unless the authorization  is terminated  or revoked sooner.       Influenza A by PCR NEGATIVE NEGATIVE Final   Influenza B by PCR NEGATIVE NEGATIVE Final    Comment: (NOTE) The Xpert Xpress SARS-CoV-2/FLU/RSV plus assay is intended as an aid in the diagnosis of influenza from Nasopharyngeal swab specimens and should not be used as a sole basis for treatment. Nasal washings and aspirates are unacceptable for Xpert Xpress SARS-CoV-2/FLU/RSV testing.  Fact Sheet for Patients: BloggerCourse.com  Fact Sheet for Healthcare Providers: SeriousBroker.it  This test is not yet approved or cleared by the [place] FDA and has been authorized for detection and/or diagnosis of SARS-CoV-2 by FDA under an Emergency Use Authorization (EUA). This EUA will remain in effect (meaning this test can be used) for the duration of the COVID-19 declaration under Section 564(b)(1) of the Act, 21 U.S.C. section 360bbb-3(b)(1), unless the authorization is terminated or revoked.     Resp Syncytial Virus by PCR NEGATIVE NEGATIVE Final    Comment: (NOTE) Fact Sheet for Patients: BloggerCourse.com  Fact Sheet for Healthcare Providers: SeriousBroker.it  This test is not yet approved or cleared by the [place] FDA and has been authorized for detection and/or diagnosis of SARS-CoV-2 by FDA under an Emergency Use Authorization (EUA). This EUA will remain in effect (meaning this test can be used) for the duration of the COVID-19 declaration under Section 564(b)(1) of the Act, 21 U.S.C. section 360bbb-3(b)(1), unless the authorization is terminated or revoked.  Performed at [facility], [address], [place], [place] [address]   Culture, blood (Routine X 2) w Reflex to ID Panel     Status: None (Preliminary result)   Collection Time: 03/17/23  2:31 PM   Specimen: BLOOD  Result Value Ref Range Status   Specimen Description  BLOOD BLOOD LEFT ARM  Final   Special Requests NONE  Final   Culture   Final    NO GROWTH < 24 HOURS Performed at [facility], [address] [place], [place] [address]    Report Status PENDING  Incomplete  Culture, blood (Routine X 2) w Reflex to ID Panel     Status: None (Preliminary result)   Collection Time: 03/17/23  2:41 PM   Specimen: BLOOD  Result Value Ref Range Status   Specimen Description BLOOD BLOOD LEFT HAND  Final   Special Requests NONE  Final   Culture   Final    NO GROWTH < 24 HOURS Performed at [facility], [address], [place], [place] [address]    Report Status PENDING  Incomplete   Scheduled Meds:  apixaban  5 mg Oral BID   atorvastatin  80 mg Oral q1800   bisoprolol  5 mg Oral Daily   cyanocobalamin  1,000 mcg Oral Daily   dextromethorphan-guaiFENesin  1 tablet Oral BID   diltiazem  120 mg Oral Daily   ferrous sulfate  325 mg Oral Q breakfast   furosemide  40 mg Oral BID   gabapentin  400 mg Oral TID   Gerhardt's butt cream   Topical BID   glipiZIDE  5 mg Oral Q breakfast   hydrocortisone valerate cream   Topical BID   insulin aspart  0-15 Units Subcutaneous TID WC   insulin aspart  0-5 Units Subcutaneous QHS   insulin aspart  3 Units Subcutaneous  TID WC   insulin glargine-yfgn  6 Units Subcutaneous Daily   ipratropium-albuterol  3 mL Nebulization QID   levothyroxine  175 mcg Oral Q0600   mirabegron ER  25 mg Oral Daily   pantoprazole  40 mg Oral Daily   [START ON 03/19/2023] predniSONE  40 mg Oral Q breakfast   sodium chloride flush  10 mL Intravenous Q12H   sodium chloride flush  3 mL Intravenous Q12H   sodium chloride flush  3 mL Intravenous Q12H   Continuous Infusions:  ampicillin (OMNIPEN) IV 2 g (03/18/23 1542)    LOS: 1 day   Shon Hale M.D on 03/18/2023 at 8:11 PM  Go to [email or web address] - for contact info  [facility] - Office  [phone]  If 7PM-7AM, please contact night-coverage [email or web address] 03/18/2023, 8:11  PM

## 2023-03-18 NOTE — Progress Notes (Signed)
   03/18/23 0414  Vitals  Temp 97.7 F (36.5 C)  Temp Source Oral  BP (!) 148/104  MAP (mmHg) 118  BP Location Left Arm  BP Method Automatic  Patient Position (if appropriate) Lying  Pulse Rate 98  Pulse Rate Source Dinamap  Resp 16  MEWS COLOR  MEWS Score Color Green  Oxygen Therapy  SpO2 95 %  O2 Device Nasal Cannula  MEWS Score  MEWS Temp 0  MEWS Systolic 0  MEWS Pulse 0  MEWS RR 0  MEWS LOC 0  MEWS Score 0   Dr. Thomes Dinning made aware of patients BP, no new orders at this time.

## 2023-03-18 NOTE — Inpatient Diabetes Management (Signed)
Inpatient Diabetes Program Recommendations  AACE/ADA: New Consensus Statement on Inpatient Glycemic Control  Target Ranges:  Prepandial:   less than 140 mg/dL      Peak postprandial:   less than 180 mg/dL (1-2 hours)      Critically ill patients:  140 - 180 mg/dL    Latest Reference Range & Units 03/17/23 07:23 03/17/23 11:05 03/17/23 16:45 03/17/23 20:45  Glucose-Capillary 70 - 99 mg/dL 409 (H) 811 (H) 914 (H) 313 (H)   Review of Glycemic Control  Diabetes history: DM2 Outpatient Diabetes medications: Glipizide XL 5 mg QAM, Humalog 75/25 40-50 units BID Current orders for Inpatient glycemic control: Semglee 6 units daily, Glipizide XL 5 mg QAM, Novolog 0-15 units TID with meals, Novolog 0-5 units at bedtime, Novolog 3 units TID with meals; Solumedrol 40 mg Q12H   Inpatient Diabetes Program Recommendations:     Insulin: If Solumedrol is continued, please consider increasing Semglee to 10 units daily and meal coverage to Novolog 6 units TID with meals.  Thanks, Orlando Penner, RN, MSN, CDCES Diabetes Coordinator Inpatient Diabetes Program [phone] (Team Pager from 8am to 5pm)

## 2023-03-19 DIAGNOSIS — L03119 Cellulitis of unspecified part of limb: Secondary | ICD-10-CM | POA: Diagnosis not present

## 2023-03-19 DIAGNOSIS — I480 Paroxysmal atrial fibrillation: Secondary | ICD-10-CM

## 2023-03-19 DIAGNOSIS — R7881 Bacteremia: Secondary | ICD-10-CM | POA: Diagnosis not present

## 2023-03-19 DIAGNOSIS — Z794 Long term (current) use of insulin: Secondary | ICD-10-CM

## 2023-03-19 DIAGNOSIS — J441 Chronic obstructive pulmonary disease with (acute) exacerbation: Secondary | ICD-10-CM | POA: Diagnosis not present

## 2023-03-19 DIAGNOSIS — J9621 Acute and chronic respiratory failure with hypoxia: Secondary | ICD-10-CM

## 2023-03-19 DIAGNOSIS — E1165 Type 2 diabetes mellitus with hyperglycemia: Secondary | ICD-10-CM | POA: Diagnosis not present

## 2023-03-19 DIAGNOSIS — Z6841 Body Mass Index (BMI) 40.0 and over, adult: Secondary | ICD-10-CM

## 2023-03-19 LAB — GLUCOSE, CAPILLARY
Glucose-Capillary: 333 mg/dL — ABNORMAL HIGH (ref 70–99)
Glucose-Capillary: 307 mg/dL — ABNORMAL HIGH (ref 70–99)

## 2023-03-19 LAB — CULTURE, BLOOD (ROUTINE X 2): Special Requests: ADEQUATE

## 2023-03-19 MED ORDER — HYDROCORTISONE VALERATE 0.2 % EX CREA: TOPICAL_CREAM | Freq: Two times a day (BID) | CUTANEOUS | 0 refills | Status: DC

## 2023-03-19 MED ORDER — HYDRALAZINE HCL 20 MG/ML IJ SOLN: 10.0000 mg | Freq: Four times a day (QID) | INTRAMUSCULAR | Status: DC | PRN

## 2023-03-19 MED ORDER — AMOXICILLIN 500 MG PO CAPS: 1000.0000 mg | ORAL_CAPSULE | Freq: Three times a day (TID) | ORAL | 0 refills | Status: DC

## 2023-03-19 MED ORDER — AMOXICILLIN 250 MG PO CAPS
1000.0000 mg | ORAL_CAPSULE | Freq: Three times a day (TID) | ORAL | Status: DC
  Administered 2023-03-19: 1000 mg via ORAL
  Filled 2023-03-19: qty 4

## 2023-03-19 MED ORDER — IPRATROPIUM-ALBUTEROL 0.5-2.5 (3) MG/3ML IN SOLN: 3.0000 mL | Freq: Two times a day (BID) | RESPIRATORY_TRACT | Status: DC

## 2023-03-19 MED ORDER — PREDNISONE 10 MG PO TABS: ORAL_TABLET | ORAL | 2 refills | Status: DC

## 2023-03-19 NOTE — Discharge Summary (Signed)
Physician Discharge Summary   Patient: Ann [name] MRN: [id number] DOB: 04-16-1946  Admit date:     03/16/2023  Discharge date: 03/19/23  Discharge Physician: Vassie Loll   PCP: Rica Records, FNP   Recommendations at discharge:  Repeat basic metabolic panel to follow electrolytes renal function Reassess blood pressure and adjust antihypertensive treatment -Continue assisting patient with weight management Continue to closely follow CBGs with further adjustment to hypoglycemia regimen as required.  Discharge Diagnoses: Principal Problem:   COPD with acute exacerbation (HCC) Active Problems:   Morbid obesity with BMI of 50.0-59.9, adult (HCC)   Atrial fibrillation, chronic (HCC)   PAF (paroxysmal atrial fibrillation) (HCC)   Coronary artery disease of native artery of native heart with stable angina pectoris (HCC)   Uncontrolled type 2 diabetes mellitus with hyperglycemia, with long-term current use of insulin (HCC)   Acute on chronic respiratory failure with hypoxia (HCC)   Cellulitis Chronic diastolic heart failure.  Brief Hospital admission course: As per H&P written by Dr. Mariea Clonts on 03/16/2023 Ann [name]  is a 77 y.o. female with past medical history relevant for COPD, CAD, status post prior stent, atrial fibrillation, HTN, GERD, chronic anticoagulation, prior history of DVT DM2  and  chronic hypoxic respiratory failure on 2 L of oxygen presents to the ED with worsening shortness of breath on 2 L in the ED she was 84% on room air she required BiPAP initially, also complains of lower extremity redness with history of prior cellulitis -Patient reports increased cough, dyspnea, wheezing in last few days -X-ray with cardiomegaly without acute findings -EKG is atrial fibrillation with PVCs =-COVID flu and RSV negative Troponin 20 repeat 17 WBC 7.9 hemoglobin 10.0 which is close to baseline -Magnesium is 1.6, potassium is 3.4 -BNP 344 -Patient received IV  steroids bronchodilators and BiPAP in the ED and respiratory status improved with eventually weaned off BiPAP and currently on 4 to 5 L of oxygen by nasal cannula  Assessment and Plan: 1)acute COPD exacerbation---COVID,  flu and RSV negative --Patient received IV steroids bronchodilators and BiPAP in the ED and respiratory status improved with eventually weaned off BiPAP  -Respiratory status continues to improve currently on 2-3 L of oxygen by nasal cannula (which is essentially her baseline). -Will resume home oral steroids, mucolytic's and patient will be kept on antibiotics I discussed with infectious disease doctor using amoxicillin 1 g 3 times a day for another 11 days.   2)Bilateral lower extremity cellulitis----blood cultures and antibiotic choices as below #3 -Topical steroids for presumed chronic venous stasis dermatitis   3) Enterococcus faecalis bacteremia ----Preliminary blood cultures from 03/16/2023 with E faecalis and coag negative staph -Coag negative staph is probably contaminant -Faecalis is probably contaminant,  -Per ID physician stop IV Rocephin and start ampicillin  -Repeat blood cultures on 03/17/2023 -If repeat blood cultures from 03/17/2023 still negative on 03/19/2023 can switch to amoxicillin 1 g 3 times daily renally adjusted to finish 14 days of treatment; 11 days of treatment pending at discharge. -Patient blood cultures remain without any growth; afebrile and WBCs trending down. -Patient advised to maintain adequate hydration and to follow-up with PCP as an outpatient.   4) chronic Anemia--- Hgb currently around 10 which is close to baseline, watch closely while on Eliquis. -Continue to follow hemoglobin trend intermittently.   5)History of prior DVT/A-fib---  -Continue bisoprolol for rate control =-continue Eliquis for anticoagulation -No signs of ocular bleeding -Continue to follow hemoglobin trend.   6)H/o CAD----history  of prior angioplasty and  stenting - EKG is atrial fibrillation with PVCs -Continue atorvastatin and metoprolol for secondary prevention --Troponin 20 repeat 17 -Patient denies chest pain, palpitations and any other anginal equivalent symptoms.   7) chronic diastolic dysfunction CHF---- Chest-X-ray with cardiomegaly without acute findings --BNP 344 -Continue to admission diuretics and follow-up with cardiology service.   8)DM2-recent A1c 7.3 reflecting uncontrolled DM with hyperglycemia PTA -Resume home hypoglycemic regimen -Patient advised to maintain adequate hydration and to follow modified carbohydrate diet.   9)Hypomagnesemia/hypokalemia- -repleted and within normal limits at discharge -Continue to follow electrolytes trend.  Consultants: Infectious disease Procedures performed: See below for x-ray reports. Disposition: Home with home health services. Diet recommendation: Heart healthy/low-sodium and modified carbohydrate diet.  DISCHARGE MEDICATION: Allergies as of 03/19/2023       Reactions   Ace Inhibitors Other (See Comments)   Reaction unknown   Asa [aspirin] Other (See Comments)   Causes bleeding   Breztri Aerosphere [budeson-glycopyrrol-formoterol]    Tape Other (See Comments)   Skin tearing, causes scars Other Reaction(s): Other (See Comments)   Niacin Rash   Reglan [metoclopramide] Anxiety        Medication List     TAKE these medications    acetaminophen 325 MG tablet Commonly known as: TYLENOL Take 2 tablets (650 mg total) by mouth every 6 (six) hours as needed for mild pain, moderate pain or fever.   albuterol 108 (90 Base) MCG/ACT inhaler Commonly known as: VENTOLIN HFA Inhale 2 puffs into the lungs every 4 (four) hours as needed for wheezing.   amoxicillin 500 MG capsule Commonly known as: AMOXIL Take 2 capsules (1,000 mg total) by mouth every 8 (eight) hours for 11 days.   apixaban 5 MG Tabs tablet Commonly known as: ELIQUIS Take 1 tablet (5 mg total) by mouth 2  (two) times daily.   atorvastatin 80 MG tablet Commonly known as: LIPITOR Take 1 tablet (80 mg total) by mouth daily at 6 PM.   augmented betamethasone dipropionate 0.05 % cream Commonly known as: DIPROLENE-AF Apply 1 Application topically daily.   bisoprolol 5 MG tablet Commonly known as: ZEBETA TAKE 1 TABLET(5 MG) BY MOUTH DAILY What changed: See the new instructions.   budesonide 0.25 MG/2ML nebulizer solution Commonly known as: Pulmicort One vial first thing in am and with evening dose of duoneb What changed:  how much to take how to take this when to take this additional instructions   Carboxymethylcellulose Sod PF 0.5 % Soln Place 2 drops into both eyes daily as needed (for dry eye relief).   cyanocobalamin 1000 MCG tablet Take 1 tablet (1,000 mcg total) by mouth daily. What changed: how much to take   diltiazem 120 MG 24 hr capsule Commonly known as: CARDIZEM CD Take 1 capsule (120 mg total) by mouth daily.   diltiazem 30 MG tablet Commonly known as: Cardizem Take 1 tablet (30 mg total) by mouth 2 (two) times daily as needed (For heart Rate greater Than 120 BPM).   esomeprazole 40 MG capsule Commonly known as: NexIUM Take 1 capsule (40 mg total) by mouth 2 (two) times daily before a meal.   furosemide 40 MG tablet Commonly known as: Lasix Take 1 tablet (40 mg total) by mouth 2 (two) times daily.   gabapentin 400 MG capsule Commonly known as: NEURONTIN Take 1 capsule (400 mg total) by mouth 3 (three) times daily.   Gemtesa 75 MG Tabs Generic drug: Vibegron Take 1 tablet (75 mg total) by  mouth daily.   glipiZIDE XL 5 MG 24 hr tablet Generic drug: glipiZIDE TAKE ONE TABLET BY MOUTH EVERY DAY WITH BREAKFAST What changed: See the new instructions.   guaiFENesin 600 MG 12 hr tablet Commonly known as: Mucinex Take 1 tablet (600 mg total) by mouth 2 (two) times daily as needed.   hydrocortisone valerate cream 0.2 % Commonly known as: WESTCORT Apply  topically 2 (two) times daily. Apply to sparingly to both legs for treatment of stasis dermatitis.   Insulin Lispro Prot & Lispro (75-25) 100 UNIT/ML Kwikpen Commonly known as: HUMALOG 75/25 MIX Inject 40-50 Units into the skin 2 (two) times daily before a meal.   ipratropium-albuterol 0.5-2.5 (3) MG/3ML Soln Commonly known as: DUONEB Take 3 mLs by nebulization 4 (four) times daily.   Iron (Ferrous Sulfate) 325 (65 Fe) MG Tabs Take 325 mg by mouth daily.   levothyroxine 175 MCG tablet Commonly known as: SYNTHROID Take 1 tablet (175 mcg total) by mouth every morning.   nitroGLYCERIN 0.4 MG SL tablet Commonly known as: NITROSTAT Place 1 tablet (0.4 mg total) under the tongue every 5 (five) minutes as needed for chest pain.   nystatin powder Commonly known as: MYCOSTATIN/NYSTOP Apply topically 3 (three) times daily. Apply to to affected areas (groin/skin folds)   predniSONE 10 MG tablet Commonly known as: DELTASONE Take 1 tablet (10 mg total) by mouth daily with breakfast.   Salonpas Pain Relief Patch Ptch Apply 1 patch topically daily as needed (pain).   triamcinolone cream 0.1 % Commonly known as: KENALOG Apply 1 Application topically 2 (two) times daily. What changed:  when to take this reasons to take this   Vitamin D3 125 MCG (5000 UT) Caps Take 1 capsule (5,000 Units total) by mouth daily.        Follow-up Information     [facility].   Specialty: Home Health Services Why: Will contact you to schedule home health visits. Contact information: [address] [place] [address] [phone]         Rica Records, FNP. Schedule an appointment as soon as possible for a visit in 10 day(s).   Specialty: Family Medicine Contact information: [address] [place] [address] [phone]                Discharge Exam: Ceasar Mons Weights   03/16/23 0925  Weight: 128.8 kg   General exam: Alert,  awake, oriented x 3; reporting feeling breathing back to baseline and using 2-3 L nasal cannula supplementation. Respiratory system: No significant wheezing and no crackles on exam.  No using accessory muscle.  Positive scattered rhonchi. Cardiovascular system: Rate controlled, no rubs, no gallops, no JVD. Gastrointestinal system: Abdomen is obese, nondistended, soft and nontender. No organomegaly or masses felt. Normal bowel sounds heard. Central nervous system: No focal neurological deficits. Extremities/skin: No cyanosis or clubbing/bilateral venous stasis dermatitis changes without open wounds or active drainage. Psychiatry: Judgement and insight appear normal. Mood & affect appropriate.   Condition at discharge: Stable and improved.  The results of significant diagnostics from this hospitalization (including imaging, microbiology, ancillary and laboratory) are listed below for reference.   Imaging Studies: DG Chest Port 1 View  Result Date: 03/16/2023 CLINICAL DATA:  Shortness of breath and hypoxia. Lower extremity cellulitis. EXAM: PORTABLE CHEST 1 VIEW COMPARISON:  12/16/2022 FINDINGS: Stable cardiomegaly and scarring in left midlung. No evidence of acute infiltrate or edema. Cervical spine fusion hardware noted.  IMPRESSION: Stable cardiomegaly and left midlung scarring. No active disease. Electronically Signed   By: Danae Orleans M.D.   On: 03/16/2023 10:37    Microbiology: Results for orders placed or performed during the hospital encounter of 03/16/23  Culture, blood (Routine x 2)     Status: Abnormal   Collection Time: 03/16/23  9:38 AM   Specimen: Right Antecubital; Blood  Result Value Ref Range Status   Specimen Description RIGHT ANTECUBITAL BLOOD  Final   Special Requests   Final    Blood Culture adequate volume BOTTLES DRAWN AEROBIC AND ANAEROBIC   Culture  Setup Time   Final    GRAM POSITIVE COCCI IN BOTH AEROBIC AND ANAEROBIC BOTTLES CRITICAL RESULT CALLED TO, READ BACK  BY AND VERIFIED WITH: BROOKE BROWN AT 1610 03/17/2023 BY TFK CRITICAL RESULT CALLED TO, READ BACK BY AND VERIFIED WITH: PHARMD S HURTH [id number] AT 1304 BY CM    Culture (A)  Final    ENTEROCOCCUS FAECALIS STAPHYLOCOCCUS HOMINIS THE SIGNIFICANCE OF ISOLATING THIS ORGANISM FROM A SINGLE SET OF BLOOD CULTURES WHEN MULTIPLE SETS ARE DRAWN IS UNCERTAIN. PLEASE NOTIFY THE MICROBIOLOGY DEPARTMENT WITHIN ONE WEEK IF SPECIATION AND SENSITIVITIES ARE REQUIRED. STAPHYLOCOCCUS EPIDERMIDIS BY BCID UNABLE TO ISOLATE FROM CULTURE Performed at [facility], [address], [place], [place] [address]    Report Status 03/19/2023 FINAL  Final   Organism ID, Bacteria ENTEROCOCCUS FAECALIS  Final      Susceptibility   Enterococcus faecalis - MIC*    AMPICILLIN <=2 SENSITIVE Sensitive     VANCOMYCIN 1 SENSITIVE Sensitive     GENTAMICIN SYNERGY SENSITIVE Sensitive     * ENTEROCOCCUS FAECALIS  Blood Culture ID Panel (Reflexed)     Status: Abnormal   Collection Time: 03/16/23  9:38 AM  Result Value Ref Range Status   Enterococcus faecalis DETECTED (A) NOT DETECTED Final    Comment: CRITICAL RESULT CALLED TO, READ BACK BY AND VERIFIED WITH: PHARMD S HURTH [id number] AT 1315 BY CM    Enterococcus Faecium NOT DETECTED NOT DETECTED Final   Listeria monocytogenes NOT DETECTED NOT DETECTED Final   Staphylococcus species DETECTED (A) NOT DETECTED Final    Comment: CRITICAL RESULT CALLED TO, READ BACK BY AND VERIFIED WITH: PHARMD S HURTS [id number] AT 1315 BY CM    Staphylococcus aureus (BCID) NOT DETECTED NOT DETECTED Final   Staphylococcus epidermidis DETECTED (A) NOT DETECTED Final    Comment: Methicillin (oxacillin) resistant coagulase negative staphylococcus. Possible blood culture contaminant (unless isolated from more than one blood culture draw or clinical case suggests pathogenicity). No antibiotic treatment is indicated for blood  culture contaminants. CRITICAL RESULT CALLED TO, READ BACK BY AND VERIFIED  WITH: PHARMD S HURTH [id number] AT 1315 BY CM    Staphylococcus lugdunensis NOT DETECTED NOT DETECTED Final   Streptococcus species NOT DETECTED NOT DETECTED Final   Streptococcus agalactiae NOT DETECTED NOT DETECTED Final   Streptococcus pneumoniae NOT DETECTED NOT DETECTED Final   Streptococcus pyogenes NOT DETECTED NOT DETECTED Final   A.calcoaceticus-baumannii NOT DETECTED NOT DETECTED Final   Bacteroides fragilis NOT DETECTED NOT DETECTED Final   Enterobacterales NOT DETECTED NOT DETECTED Final   Enterobacter cloacae complex NOT DETECTED NOT DETECTED Final   Escherichia coli NOT DETECTED NOT DETECTED Final   Klebsiella aerogenes NOT DETECTED NOT DETECTED Final   Klebsiella oxytoca NOT DETECTED NOT DETECTED Final   Klebsiella pneumoniae NOT DETECTED NOT DETECTED Final   Proteus species NOT DETECTED NOT DETECTED Final   Salmonella  species NOT DETECTED NOT DETECTED Final   Serratia marcescens NOT DETECTED NOT DETECTED Final   Haemophilus influenzae NOT DETECTED NOT DETECTED Final   Neisseria meningitidis NOT DETECTED NOT DETECTED Final   Pseudomonas aeruginosa NOT DETECTED NOT DETECTED Final   Stenotrophomonas maltophilia NOT DETECTED NOT DETECTED Final   Candida albicans NOT DETECTED NOT DETECTED Final   Candida auris NOT DETECTED NOT DETECTED Final   Candida glabrata NOT DETECTED NOT DETECTED Final   Candida krusei NOT DETECTED NOT DETECTED Final   Candida parapsilosis NOT DETECTED NOT DETECTED Final   Candida tropicalis NOT DETECTED NOT DETECTED Final   Cryptococcus neoformans/gattii NOT DETECTED NOT DETECTED Final   Methicillin resistance mecA/C DETECTED (A) NOT DETECTED Final    Comment: CRITICAL RESULT CALLED TO, READ BACK BY AND VERIFIED WITH: PHARMD S HURTH [id number] AT 1315 BY CM    Vancomycin resistance NOT DETECTED NOT DETECTED Final    Comment: Performed at [facility], [address], [place], [place] [address]  Culture, blood (Routine x 2)     Status: None  (Preliminary result)   Collection Time: 03/16/23  9:58 AM   Specimen: Left Antecubital; Blood  Result Value Ref Range Status   Specimen Description LEFT ANTECUBITAL AEROBIC BOTTLE ONLY  Final   Special Requests   Final    Blood Culture results may not be optimal due to an excessive volume of blood received in culture bottles   Culture   Final    NO GROWTH 3 DAYS Performed at [facility], [address] [place], [place] [address]    Report Status PENDING  Incomplete  Resp panel by RT-PCR (RSV, Flu A&B, Covid) Anterior Nasal Swab     Status: None   Collection Time: 03/16/23  1:03 PM   Specimen: Anterior Nasal Swab  Result Value Ref Range Status   SARS Coronavirus 2 by RT PCR NEGATIVE NEGATIVE Final    Comment: (NOTE) SARS-CoV-2 target nucleic acids are NOT DETECTED.  The SARS-CoV-2 RNA is generally detectable in upper respiratory specimens during the acute phase of infection. The lowest concentration of SARS-CoV-2 viral copies this assay can detect is 138 copies/mL. A negative result does not preclude SARS-Cov-2 infection and should not be used as the sole basis for treatment or other patient management decisions. A negative result may occur with  improper specimen collection/handling, submission of specimen other than nasopharyngeal swab, presence of viral mutation(s) within the areas targeted by this assay, and inadequate number of viral copies(<138 copies/mL). A negative result must be combined with clinical observations, patient history, and epidemiological information. The expected result is Negative.  Fact Sheet for Patients:  BloggerCourse.com  Fact Sheet for Healthcare Providers:  SeriousBroker.it  This test is no t yet approved or cleared by the [place] FDA and  has been authorized for detection and/or diagnosis of SARS-CoV-2 by FDA under an Emergency Use Authorization (EUA). This EUA will remain  in effect  (meaning this test can be used) for the duration of the COVID-19 declaration under Section 564(b)(1) of the Act, 21 U.S.C.section 360bbb-3(b)(1), unless the authorization is terminated  or revoked sooner.       Influenza A by PCR NEGATIVE NEGATIVE Final   Influenza B by PCR NEGATIVE NEGATIVE Final    Comment: (NOTE) The Xpert Xpress SARS-CoV-2/FLU/RSV plus assay is intended as an aid in the diagnosis of influenza from Nasopharyngeal swab specimens and should not be used as a sole basis for treatment. Nasal washings and aspirates are  unacceptable for Xpert Xpress SARS-CoV-2/FLU/RSV testing.  Fact Sheet for Patients: BloggerCourse.com  Fact Sheet for Healthcare Providers: SeriousBroker.it  This test is not yet approved or cleared by the [place] FDA and has been authorized for detection and/or diagnosis of SARS-CoV-2 by FDA under an Emergency Use Authorization (EUA). This EUA will remain in effect (meaning this test can be used) for the duration of the COVID-19 declaration under Section 564(b)(1) of the Act, 21 U.S.C. section 360bbb-3(b)(1), unless the authorization is terminated or revoked.     Resp Syncytial Virus by PCR NEGATIVE NEGATIVE Final    Comment: (NOTE) Fact Sheet for Patients: BloggerCourse.com  Fact Sheet for Healthcare Providers: SeriousBroker.it  This test is not yet approved or cleared by the [place] FDA and has been authorized for detection and/or diagnosis of SARS-CoV-2 by FDA under an Emergency Use Authorization (EUA). This EUA will remain in effect (meaning this test can be used) for the duration of the COVID-19 declaration under Section 564(b)(1) of the Act, 21 U.S.C. section 360bbb-3(b)(1), unless the authorization is terminated or revoked.  Performed at [facility], [address] [place], [place] [address]   Culture, blood (Routine X 2)  w Reflex to ID Panel     Status: None (Preliminary result)   Collection Time: 03/17/23  2:31 PM   Specimen: BLOOD  Result Value Ref Range Status   Specimen Description BLOOD BLOOD LEFT ARM  Final   Special Requests NONE  Final   Culture   Final    NO GROWTH 2 DAYS Performed at [facility], [address] [place], [place] [address]    Report Status PENDING  Incomplete  Culture, blood (Routine X 2) w Reflex to ID Panel     Status: None (Preliminary result)   Collection Time: 03/17/23  2:41 PM   Specimen: BLOOD  Result Value Ref Range Status   Specimen Description BLOOD BLOOD LEFT HAND  Final   Special Requests NONE  Final   Culture   Final    NO GROWTH 2 DAYS Performed at [facility], [address], [place], [place] [address]    Report Status PENDING  Incomplete    Labs: CBC: Recent Labs  Lab 03/16/23 0958 03/18/23 0421  WBC 7.9 12.2*  NEUTROABS 5.4  --   HGB 10.0* 11.9*  HCT 32.0* 39.6  MCV 97.9 97.5  PLT 183 228   Basic Metabolic Panel: Recent Labs  Lab 03/16/23 0958 03/18/23 0421  NA 140 135  K 3.4* 4.3  CL 103 97*  CO2 27 29  GLUCOSE 144* 261*  BUN 9 21  CREATININE 0.70 0.88  CALCIUM 8.7* 8.5*  MG 1.6*  --    CBG: Recent Labs  Lab 03/18/23 1124 03/18/23 1633 03/18/23 2046 03/19/23 0727 03/19/23 1144  GLUCAP 323* 332* 354* 333* 307*    Discharge time spent: greater than 30 minutes.  Signed: Vassie Loll, MD [facility] 03/19/2023

## 2023-03-19 NOTE — Evaluation (Signed)
Physical Therapy Evaluation Patient Details Name: Ann [name] MRN: [id number] DOB: [birth year] Today's Date: 03/19/2023  History of Present Illness  Ann [name]  is a 77 y.o. female with past medical history relevant for COPD, CAD, status post prior stent, atrial fibrillation, HTN, GERD, chronic anticoagulation, prior history of DVT DM2  and  chronic hypoxic respiratory failure on 2 L of oxygen presents to the ED with worsening shortness of breath on 2 L in the ED she was 84% on room air she required BiPAP initially, also complains of lower extremity redness with history of prior cellulitis   Clinical Impression  Patient demonstrates slow labored movement for sitting up at bedside, fair/good return for transferring to/from chair and commode in bathroom, limited to ambulating in room mostly due to c/o fatigue and mild SOB with SpO2 dropping from 95% to 86% while on 2 LPM.  Patient put back on 3 LPM with SpO2 increasing to 95% and tolerating sitting up in chair after therapy - nursing staff aware.  Patient will benefit from continued skilled physical therapy in hospital and recommended venue below to increase strength, balance, endurance for safe ADLs and gait.          If plan is discharge home, recommend the following: A little help with walking and/or transfers;A little help with bathing/dressing/bathroom;Assistance with cooking/housework;Help with stairs or ramp for entrance   Can travel by private vehicle        Equipment Recommendations None recommended by PT  Recommendations for Other Services       Functional Status Assessment Patient has had a recent decline in their functional status and demonstrates the ability to make significant improvements in function in a reasonable and predictable amount of time.     Precautions / Restrictions Precautions Precautions: Fall Restrictions Weight Bearing Restrictions: No      Mobility  Bed Mobility Overal bed mobility: Needs  Assistance Bed Mobility: Supine to Sit     Supine to sit: Contact guard     General bed mobility comments: increased time, labored movement    Transfers Overall transfer level: Needs assistance Equipment used: Rolling walker (2 wheels) Transfers: Sit to/from Stand, Bed to chair/wheelchair/BSC Sit to Stand: Supervision   Step pivot transfers: Supervision       General transfer comment: slow labored movement    Ambulation/Gait Ambulation/Gait assistance: Supervision, Contact guard assist Gait Distance (Feet): 16 Feet Assistive device: Rolling walker (2 wheels) Gait Pattern/deviations: Decreased step length - left, Decreased stance time - right, Decreased stride length Gait velocity: decreased     General Gait Details: slow labored unsteady cadence without loss of balance, limited mostly due to fatigue, on 2 LPM with SpO2 dropping from 95% to 86% while on 2 LPM  Stairs            Wheelchair Mobility     Tilt Bed    Modified Rankin (Stroke Patients Only)       Balance Overall balance assessment: Needs assistance Sitting-balance support: Feet supported, No upper extremity supported Sitting balance-Leahy Scale: Good Sitting balance - Comments: seated at EOB   Standing balance support: Reliant on assistive device for balance, During functional activity, Bilateral upper extremity supported Standing balance-Leahy Scale: Fair Standing balance comment: fair/good using RW                             Pertinent Vitals/Pain Pain Assessment Pain Assessment: 0-10 Pain Score: 4  Pain Location: stomcach,  neck Pain Descriptors / Indicators: Discomfort, Sore Pain Intervention(s): Limited activity within patient's tolerance, Monitored during session, Repositioned    Home Living Family/patient expects to be discharged to:: Private residence Living Arrangements: Spouse/significant other Available Help at Discharge: Family;Available 24 hours/day Type of  Home: House Home Access: Ramped entrance     Alternate Level Stairs-Number of Steps: flight Home Layout: Two level;Able to live on main level with bedroom/bathroom;Full bath on main level Home Equipment: Rollator (4 wheels);BSC/3in1;Grab bars - tub/shower;Wheelchair - manual;Hospital bed      Prior Function Prior Level of Function : Needs assist       Physical Assist : Mobility (physical);ADLs (physical) Mobility (physical): Bed mobility;Transfers;Gait;Stairs   Mobility Comments: household ambulator using RW ADLs Comments: Assisted by family     Extremity/Trunk Assessment   Upper Extremity Assessment Upper Extremity Assessment: Overall WFL for tasks assessed    Lower Extremity Assessment Lower Extremity Assessment: Generalized weakness    Cervical / Trunk Assessment Cervical / Trunk Assessment: Normal  Communication      Cognition Arousal: Alert Behavior During Therapy: WFL for tasks assessed/performed Overall Cognitive Status: Within Functional Limits for tasks assessed                                          General Comments      Exercises     Assessment/Plan    PT Assessment Patient needs continued PT services  PT Problem List Decreased strength;Decreased activity tolerance;Decreased balance;Decreased mobility       PT Treatment Interventions DME instruction;Gait training;Stair training;Functional mobility training;Therapeutic activities;Therapeutic exercise;Balance training;Patient/family education    PT Goals (Current goals can be found in the Care Plan section)  Acute Rehab PT Goals Patient Stated Goal: return home with family to assist PT Goal Formulation: With patient Time For Goal Achievement: 04/13/23 Potential to Achieve Goals: Good    Frequency Min 3X/week     Co-evaluation               AM-PAC PT "6 Clicks" Mobility  Outcome Measure Help needed turning from your back to your side while in a flat bed without  using bedrails?: None Help needed moving from lying on your back to sitting on the side of a flat bed without using bedrails?: A Little Help needed moving to and from a bed to a chair (including a wheelchair)?: A Little Help needed standing up from a chair using your arms (e.g., wheelchair or bedside chair)?: A Little Help needed to walk in hospital room?: A Little Help needed climbing 3-5 steps with a railing? : A Lot 6 Click Score: 18    End of Session Equipment Utilized During Treatment: Oxygen Activity Tolerance: Patient tolerated treatment well;Patient limited by fatigue Patient left: in chair;with call bell/phone within reach Nurse Communication: Mobility status PT Visit Diagnosis: Unsteadiness on feet (R26.81);Other abnormalities of gait and mobility (R26.89);Muscle weakness (generalized) (M62.81)    Time: 1610-9604 PT Time Calculation (min) (ACUTE ONLY): 32 min   Charges:   PT Evaluation $PT Eval Moderate Complexity: 1 Mod PT Treatments $Therapeutic Activity: 23-37 mins PT General Charges $$ ACUTE PT VISIT: 1 Visit         2:15 PM, 03/19/23 Ocie Bob, MPT Physical Therapist with [facility] [phone] office [phone] mobile phone

## 2023-03-19 NOTE — Plan of Care (Signed)
  Problem: Coping: Goal: Ability to adjust to condition or change in health will improve Outcome: Progressing   Problem: Education: Goal: Knowledge of General Education information will improve Description: Including pain rating scale, medication(s)/side effects and non-pharmacologic comfort measures Outcome: Progressing   

## 2023-03-19 NOTE — TOC Transition Note (Signed)
Transition of Care [facility]) - CM/SW Discharge Note   Patient Details  Name: Ann [name] MRN: [id number] Date of Birth: 1945/12/02  Transition of Care [facility] CM/SW Contact:  Karn Cassis, LCSW Phone Number: 03/19/2023, 2:45 PM   Clinical Narrative:  Pt d/c today. PT recommends HHPT. Pt agreeable with no preference on agency. Referred and accepted by Clifton Custard with [facility]. Clifton Custard aware of d/c. HHPT orders in.      Final next level of care: Home w Home Health Services Barriers to Discharge: Barriers Resolved   Patient Goals and CMS Choice CMS Medicare.gov Compare Post Acute Care list provided to:: Patient Choice offered to / list presented to : NA  Discharge Placement                         Discharge Plan and Services Additional resources added to the After Visit Summary for   In-house Referral: NA Discharge Planning Services: NA Post Acute Care Choice: NA          DME Arranged: N/A DME Agency: NA       HH Arranged: PT HH Agency: [facility] Date HH Agency Contacted: 03/19/23 Time HH Agency Contacted: 1119 Representative spoke with at [facility] Agency: Clifton Custard  Social Determinants of Health (SDOH) Interventions SDOH Screenings   Food Insecurity: No Food Insecurity (03/16/2023)  Housing: Low Risk  (03/16/2023)  Transportation Needs: No Transportation Needs (03/16/2023)  Utilities: Not At Risk (03/16/2023)  Alcohol Screen: Low Risk  (10/22/2022)  Depression (PHQ2-9): Low Risk  (10/30/2022)  Recent Concern: Depression (PHQ2-9) - High Risk (10/16/2022)  Financial Resource Strain: Low Risk  (10/22/2022)  Physical Activity: Insufficiently Active (10/22/2022)  Social Connections: Moderately Integrated (10/22/2022)  Stress: No Stress Concern Present (10/22/2022)  Tobacco Use: Medium Risk (03/16/2023)     Readmission Risk Interventions    03/17/2023    5:19 PM 12/04/2022   10:26 AM 09/18/2022    8:34 AM  Readmission Risk Prevention Plan   Transportation Screening Complete Complete Complete  HRI or Home Care Consult Complete Complete Complete  Social Work Consult for Recovery Care Planning/Counseling Complete Complete Complete  Palliative Care Screening Not Applicable Not Applicable Not Applicable  Medication Review Oceanographer) Complete Complete Complete

## 2023-03-19 NOTE — Plan of Care (Signed)
  Problem: Acute Rehab PT Goals(only PT should resolve) Goal: Pt Will Go Supine/Side To Sit Outcome: Progressing Flowsheets (Taken 03/19/2023 1416) Pt will go Supine/Side to Sit:  with modified independence  with supervision Goal: Patient Will Transfer Sit To/From Stand Outcome: Progressing Flowsheets (Taken 03/19/2023 1416) Patient will transfer sit to/from stand:  with modified independence  with supervision Goal: Pt Will Transfer Bed To Chair/Chair To Bed Outcome: Progressing Flowsheets (Taken 03/19/2023 1416) Pt will Transfer Bed to Chair/Chair to Bed:  with modified independence  with supervision Goal: Pt Will Ambulate Outcome: Progressing Flowsheets (Taken 03/19/2023 1416) Pt will Ambulate:  25 feet  with modified independence  with supervision  with rolling walker   2:17 PM, 03/19/23 Ann [name], MPT Physical Therapist with [facility] [phone] office [phone] mobile phone

## 2023-03-19 NOTE — Progress Notes (Signed)
Repeat bcx negative  Afebrile   Exam: Reviewed chart   Labs: Lab Results  Component Value Date   WBC 12.2 (H) 03/18/2023   HGB 11.9 (L) 03/18/2023   HCT 39.6 03/18/2023   MCV 97.5 03/18/2023   PLT 228 03/18/2023   Last metabolic panel Lab Results  Component Value Date   GLUCOSE 261 (H) 03/18/2023   NA 135 03/18/2023   K 4.3 03/18/2023   CL 97 (L) 03/18/2023   CO2 29 03/18/2023   BUN 21 03/18/2023   CREATININE 0.88 03/18/2023   GFRNONAA >60 03/18/2023   CALCIUM 8.5 (L) 03/18/2023   PHOS 4.3 09/17/2022   PROT 6.6 12/03/2022   ALBUMIN 3.3 (L) 12/03/2022   LABGLOB 2.9 12/30/2022   AGRATIO 1.2 12/30/2022   BILITOT 0.7 12/03/2022   ALKPHOS 74 12/03/2022   AST 22 12/03/2022   ALT 20 12/03/2022   ANIONGAP 9 03/18/2023   Imaging: Reviewed  No new   A/p: 10/21 repeat bcx negative  Will treat as uncomplicated e faecalis bacteremia and can transition to oral amoxicillin high dose today to finish 10 more days  Discussed with primary team  Ok to discharge from id standpoint once cleared by primary team   I spent more than 10 minute reviewing data/chart, and coordinating care, discussing diagnostics/treatment plan with treatment team

## 2023-03-21 ENCOUNTER — Encounter (HOSPITAL_COMMUNITY): Payer: Self-pay

## 2023-03-21 ENCOUNTER — Other Ambulatory Visit: Payer: Self-pay

## 2023-03-21 ENCOUNTER — Inpatient Hospital Stay (HOSPITAL_COMMUNITY)
Admission: EM | Admit: 2023-03-21 | Discharge: 2023-03-26 | DRG: 190 | Disposition: A | Discharge status: Skilled Nursing Facility | Payer: Medicare HMO | Attending: Internal Medicine | Admitting: Internal Medicine

## 2023-03-21 ENCOUNTER — Emergency Department (HOSPITAL_COMMUNITY): Payer: Medicare HMO

## 2023-03-21 DIAGNOSIS — I872 Venous insufficiency (chronic) (peripheral): Secondary | ICD-10-CM | POA: Diagnosis present

## 2023-03-21 DIAGNOSIS — I959 Hypotension, unspecified: Secondary | ICD-10-CM | POA: Diagnosis not present

## 2023-03-21 DIAGNOSIS — Z66 Do not resuscitate: Secondary | ICD-10-CM | POA: Diagnosis present

## 2023-03-21 DIAGNOSIS — R0602 Shortness of breath: Secondary | ICD-10-CM | POA: Diagnosis not present

## 2023-03-21 DIAGNOSIS — Z7952 Long term (current) use of systemic steroids: Secondary | ICD-10-CM

## 2023-03-21 DIAGNOSIS — Z1152 Encounter for screening for COVID-19: Secondary | ICD-10-CM

## 2023-03-21 DIAGNOSIS — E785 Hyperlipidemia, unspecified: Secondary | ICD-10-CM | POA: Diagnosis present

## 2023-03-21 DIAGNOSIS — Z6841 Body Mass Index (BMI) 40.0 and over, adult: Secondary | ICD-10-CM | POA: Diagnosis not present

## 2023-03-21 DIAGNOSIS — R7881 Bacteremia: Secondary | ICD-10-CM | POA: Diagnosis not present

## 2023-03-21 DIAGNOSIS — N183 Chronic kidney disease, stage 3 unspecified: Secondary | ICD-10-CM | POA: Diagnosis present

## 2023-03-21 DIAGNOSIS — B952 Enterococcus as the cause of diseases classified elsewhere: Secondary | ICD-10-CM | POA: Diagnosis present

## 2023-03-21 DIAGNOSIS — Z794 Long term (current) use of insulin: Secondary | ICD-10-CM | POA: Diagnosis not present

## 2023-03-21 DIAGNOSIS — E1142 Type 2 diabetes mellitus with diabetic polyneuropathy: Secondary | ICD-10-CM | POA: Diagnosis present

## 2023-03-21 DIAGNOSIS — I130 Hypertensive heart and chronic kidney disease with heart failure and stage 1 through stage 4 chronic kidney disease, or unspecified chronic kidney disease: Secondary | ICD-10-CM | POA: Diagnosis present

## 2023-03-21 DIAGNOSIS — Z7901 Long term (current) use of anticoagulants: Secondary | ICD-10-CM

## 2023-03-21 DIAGNOSIS — E1122 Type 2 diabetes mellitus with diabetic chronic kidney disease: Secondary | ICD-10-CM | POA: Diagnosis not present

## 2023-03-21 DIAGNOSIS — L03115 Cellulitis of right lower limb: Secondary | ICD-10-CM | POA: Diagnosis not present

## 2023-03-21 DIAGNOSIS — Z86718 Personal history of other venous thrombosis and embolism: Secondary | ICD-10-CM

## 2023-03-21 DIAGNOSIS — J9811 Atelectasis: Secondary | ICD-10-CM | POA: Diagnosis present

## 2023-03-21 DIAGNOSIS — Z7984 Long term (current) use of oral hypoglycemic drugs: Secondary | ICD-10-CM

## 2023-03-21 DIAGNOSIS — Z888 Allergy status to other drugs, medicaments and biological substances status: Secondary | ICD-10-CM

## 2023-03-21 DIAGNOSIS — J984 Other disorders of lung: Secondary | ICD-10-CM | POA: Diagnosis not present

## 2023-03-21 DIAGNOSIS — I5032 Chronic diastolic (congestive) heart failure: Secondary | ICD-10-CM | POA: Diagnosis present

## 2023-03-21 DIAGNOSIS — I4891 Unspecified atrial fibrillation: Secondary | ICD-10-CM | POA: Diagnosis not present

## 2023-03-21 DIAGNOSIS — Z9981 Dependence on supplemental oxygen: Secondary | ICD-10-CM | POA: Diagnosis not present

## 2023-03-21 DIAGNOSIS — G8929 Other chronic pain: Secondary | ICD-10-CM | POA: Diagnosis present

## 2023-03-21 DIAGNOSIS — E2740 Unspecified adrenocortical insufficiency: Secondary | ICD-10-CM | POA: Diagnosis present

## 2023-03-21 DIAGNOSIS — M549 Dorsalgia, unspecified: Secondary | ICD-10-CM | POA: Diagnosis present

## 2023-03-21 DIAGNOSIS — M542 Cervicalgia: Secondary | ICD-10-CM | POA: Diagnosis present

## 2023-03-21 DIAGNOSIS — I5033 Acute on chronic diastolic (congestive) heart failure: Secondary | ICD-10-CM | POA: Diagnosis present

## 2023-03-21 DIAGNOSIS — Z1621 Resistance to vancomycin: Secondary | ICD-10-CM | POA: Diagnosis present

## 2023-03-21 DIAGNOSIS — L0390 Cellulitis, unspecified: Secondary | ICD-10-CM | POA: Diagnosis present

## 2023-03-21 DIAGNOSIS — R918 Other nonspecific abnormal finding of lung field: Secondary | ICD-10-CM | POA: Diagnosis not present

## 2023-03-21 DIAGNOSIS — E876 Hypokalemia: Secondary | ICD-10-CM | POA: Diagnosis not present

## 2023-03-21 DIAGNOSIS — Z8249 Family history of ischemic heart disease and other diseases of the circulatory system: Secondary | ICD-10-CM

## 2023-03-21 DIAGNOSIS — Z955 Presence of coronary angioplasty implant and graft: Secondary | ICD-10-CM

## 2023-03-21 DIAGNOSIS — Z981 Arthrodesis status: Secondary | ICD-10-CM

## 2023-03-21 DIAGNOSIS — Z886 Allergy status to analgesic agent status: Secondary | ICD-10-CM

## 2023-03-21 DIAGNOSIS — B372 Candidiasis of skin and nail: Secondary | ICD-10-CM | POA: Diagnosis present

## 2023-03-21 DIAGNOSIS — E1165 Type 2 diabetes mellitus with hyperglycemia: Secondary | ICD-10-CM | POA: Diagnosis present

## 2023-03-21 DIAGNOSIS — I2510 Atherosclerotic heart disease of native coronary artery without angina pectoris: Secondary | ICD-10-CM | POA: Diagnosis present

## 2023-03-21 DIAGNOSIS — I10 Essential (primary) hypertension: Secondary | ICD-10-CM | POA: Diagnosis not present

## 2023-03-21 DIAGNOSIS — J9601 Acute respiratory failure with hypoxia: Secondary | ICD-10-CM

## 2023-03-21 DIAGNOSIS — Z2239 Carrier of other specified bacterial diseases: Secondary | ICD-10-CM

## 2023-03-21 DIAGNOSIS — Z79899 Other long term (current) drug therapy: Secondary | ICD-10-CM

## 2023-03-21 DIAGNOSIS — Z800 Family history of malignant neoplasm of digestive organs: Secondary | ICD-10-CM

## 2023-03-21 DIAGNOSIS — J9621 Acute and chronic respiratory failure with hypoxia: Secondary | ICD-10-CM | POA: Diagnosis not present

## 2023-03-21 DIAGNOSIS — J441 Chronic obstructive pulmonary disease with (acute) exacerbation: Secondary | ICD-10-CM | POA: Diagnosis not present

## 2023-03-21 DIAGNOSIS — E66813 Obesity, class 3: Secondary | ICD-10-CM | POA: Diagnosis present

## 2023-03-21 DIAGNOSIS — E039 Hypothyroidism, unspecified: Secondary | ICD-10-CM | POA: Diagnosis not present

## 2023-03-21 DIAGNOSIS — H409 Unspecified glaucoma: Secondary | ICD-10-CM | POA: Diagnosis present

## 2023-03-21 DIAGNOSIS — R42 Dizziness and giddiness: Secondary | ICD-10-CM | POA: Diagnosis not present

## 2023-03-21 DIAGNOSIS — Z801 Family history of malignant neoplasm of trachea, bronchus and lung: Secondary | ICD-10-CM

## 2023-03-21 DIAGNOSIS — I482 Chronic atrial fibrillation, unspecified: Secondary | ICD-10-CM | POA: Diagnosis not present

## 2023-03-21 DIAGNOSIS — Z87891 Personal history of nicotine dependence: Secondary | ICD-10-CM

## 2023-03-21 DIAGNOSIS — Z91048 Other nonmedicinal substance allergy status: Secondary | ICD-10-CM

## 2023-03-21 DIAGNOSIS — I517 Cardiomegaly: Secondary | ICD-10-CM | POA: Diagnosis not present

## 2023-03-21 DIAGNOSIS — Z79890 Hormone replacement therapy (postmenopausal): Secondary | ICD-10-CM

## 2023-03-21 DIAGNOSIS — N1831 Chronic kidney disease, stage 3a: Secondary | ICD-10-CM | POA: Diagnosis not present

## 2023-03-21 LAB — CBC WITH DIFFERENTIAL/PLATELET
Abs Immature Granulocytes: 0.09 10*3/uL — ABNORMAL HIGH (ref 0.00–0.07)
Basophils Absolute: 0 10*3/uL (ref 0.0–0.1)
Basophils Relative: 0 %
Eosinophils Absolute: 0.1 10*3/uL (ref 0.0–0.5)
Eosinophils Relative: 1 %
HCT: 46.3 % — ABNORMAL HIGH (ref 36.0–46.0)
Hemoglobin: 14.9 g/dL (ref 12.0–15.0)
Immature Granulocytes: 1 %
Lymphocytes Relative: 11 %
Lymphs Abs: 1.3 10*3/uL (ref 0.7–4.0)
MCH: 29.1 pg (ref 26.0–34.0)
MCHC: 32.2 g/dL (ref 30.0–36.0)
MCV: 90.4 fL (ref 80.0–100.0)
Monocytes Absolute: 0.9 10*3/uL (ref 0.1–1.0)
Monocytes Relative: 8 %
Neutro Abs: 9.7 10*3/uL — ABNORMAL HIGH (ref 1.7–7.7)
Neutrophils Relative %: 79 %
Platelets: 253 10*3/uL (ref 150–400)
RBC: 5.12 MIL/uL — ABNORMAL HIGH (ref 3.87–5.11)
RDW: 12.8 % (ref 11.5–15.5)
WBC: 12.2 10*3/uL — ABNORMAL HIGH (ref 4.0–10.5)
nRBC: 0 % (ref 0.0–0.2)

## 2023-03-21 LAB — TROPONIN I (HIGH SENSITIVITY)
Troponin I (High Sensitivity): 40 ng/L — ABNORMAL HIGH (ref <18)
Troponin I (High Sensitivity): 36 ng/L — ABNORMAL HIGH (ref <18)

## 2023-03-21 LAB — BRAIN NATRIURETIC PEPTIDE: B Natriuretic Peptide: 1254 pg/mL — ABNORMAL HIGH (ref 0.0–100.0)

## 2023-03-21 LAB — RESP PANEL BY RT-PCR (RSV, FLU A&B, COVID)  RVPGX2
Influenza A by PCR: NEGATIVE
Influenza B by PCR: NEGATIVE
Resp Syncytial Virus by PCR: NEGATIVE
SARS Coronavirus 2 by RT PCR: NEGATIVE

## 2023-03-21 LAB — COMPREHENSIVE METABOLIC PANEL WITH GFR
ALT: 17 U/L (ref 0–44)
AST: 19 U/L (ref 15–41)
Albumin: 3.4 g/dL — ABNORMAL LOW (ref 3.5–5.0)
Alkaline Phosphatase: 69 U/L (ref 38–126)
Anion gap: 11 (ref 5–15)
BUN: 14 mg/dL (ref 8–23)
CO2: 35 mmol/L — ABNORMAL HIGH (ref 22–32)
Calcium: 8.9 mg/dL (ref 8.9–10.3)
Chloride: 91 mmol/L — ABNORMAL LOW (ref 98–111)
Creatinine, Ser: 0.6 mg/dL (ref 0.44–1.00)
GFR, Estimated: >60 mL/min (ref >60)
Glucose, Bld: 214 mg/dL — ABNORMAL HIGH (ref 70–99)
Potassium: 3.1 mmol/L — ABNORMAL LOW (ref 3.5–5.1)
Sodium: 137 mmol/L (ref 135–145)
Total Bilirubin: 1.2 mg/dL (ref 0.3–1.2)
Total Protein: 6.9 g/dL (ref 6.5–8.1)

## 2023-03-21 LAB — URINALYSIS, W/ REFLEX TO CULTURE (INFECTION SUSPECTED)
Bilirubin Urine: NEGATIVE
Glucose, UA: 50 mg/dL — AB
Hgb urine dipstick: NEGATIVE
Ketones, ur: 5 mg/dL — AB
Leukocytes,Ua: NEGATIVE
Nitrite: NEGATIVE
Protein, ur: 100 mg/dL — AB
Specific Gravity, Urine: 1.01 (ref 1.005–1.030)
pH: 7 (ref 5.0–8.0)

## 2023-03-21 LAB — CULTURE, BLOOD (ROUTINE X 2): Culture: NO GROWTH

## 2023-03-21 LAB — LACTIC ACID, PLASMA
Lactic Acid, Venous: 1.6 mmol/L (ref 0.5–1.9)
Lactic Acid, Venous: 1.6 mmol/L (ref 0.5–1.9)

## 2023-03-21 LAB — MAGNESIUM: Magnesium: 2.1 mg/dL (ref 1.7–2.4)

## 2023-03-21 MED ORDER — METHYLPREDNISOLONE SODIUM SUCC 125 MG IJ SOLR
125.0000 mg | Freq: Once | INTRAMUSCULAR | Status: AC
Start: 2023-03-21 — End: 2023-03-21
  Administered 2023-03-21: 125 mg via INTRAVENOUS
  Filled 2023-03-21: qty 2

## 2023-03-21 MED ORDER — POTASSIUM CHLORIDE 10 MEQ/100ML IV SOLN
10.0000 meq | INTRAVENOUS | Status: DC
  Administered 2023-03-21: 10 meq via INTRAVENOUS
  Filled 2023-03-21: qty 100

## 2023-03-21 MED ORDER — INSULIN ASPART PROT & ASPART (70-30 MIX) 100 UNIT/ML ~~LOC~~ SUSP
30.0000 [IU] | Freq: Two times a day (BID) | SUBCUTANEOUS | Status: DC
  Administered 2023-03-22 – 2023-03-26 (×9): 30 [IU] via SUBCUTANEOUS
  Filled 2023-03-21: qty 10

## 2023-03-21 MED ORDER — INSULIN LISPRO PROT & LISPRO (75-25 MIX) 100 UNIT/ML KWIKPEN: 30.0000 [IU] | PEN_INJECTOR | Freq: Two times a day (BID) | SUBCUTANEOUS | Status: DC

## 2023-03-21 MED ORDER — BISOPROLOL FUMARATE 5 MG PO TABS
10.0000 mg | ORAL_TABLET | Freq: Every day | ORAL | Status: DC
  Administered 2023-03-22 – 2023-03-26 (×5): 10 mg via ORAL
  Filled 2023-03-21 (×5): qty 2

## 2023-03-21 MED ORDER — BUDESONIDE 0.25 MG/2ML IN SUSP
0.2500 mg | Freq: Two times a day (BID) | RESPIRATORY_TRACT | Status: DC
  Administered 2023-03-21 – 2023-03-26 (×10): 0.25 mg via RESPIRATORY_TRACT
  Filled 2023-03-21 (×11): qty 2

## 2023-03-21 MED ORDER — SODIUM CHLORIDE 0.9 % IV SOLN
1.0000 g | INTRAVENOUS | Status: DC
  Administered 2023-03-21: 1 g via INTRAVENOUS
  Filled 2023-03-21: qty 10

## 2023-03-21 MED ORDER — GABAPENTIN 400 MG PO CAPS
400.0000 mg | ORAL_CAPSULE | Freq: Three times a day (TID) | ORAL | Status: DC
  Administered 2023-03-21 – 2023-03-26 (×14): 400 mg via ORAL
  Filled 2023-03-21 (×14): qty 1

## 2023-03-21 MED ORDER — FUROSEMIDE 40 MG PO TABS: 40.0000 mg | ORAL_TABLET | Freq: Two times a day (BID) | ORAL | Status: DC

## 2023-03-21 MED ORDER — NITROGLYCERIN 0.4 MG SL SUBL: 0.4000 mg | SUBLINGUAL_TABLET | SUBLINGUAL | Status: DC | PRN

## 2023-03-21 MED ORDER — PANTOPRAZOLE SODIUM 40 MG PO TBEC
40.0000 mg | DELAYED_RELEASE_TABLET | Freq: Every day | ORAL | Status: DC
  Administered 2023-03-21 – 2023-03-26 (×6): 40 mg via ORAL
  Filled 2023-03-21 (×6): qty 1

## 2023-03-21 MED ORDER — POTASSIUM CHLORIDE CRYS ER 10 MEQ PO TBCR
30.0000 meq | EXTENDED_RELEASE_TABLET | Freq: Four times a day (QID) | ORAL | Status: AC
  Administered 2023-03-21 (×2): 30 meq via ORAL
  Filled 2023-03-21: qty 2
  Filled 2023-03-21: qty 1

## 2023-03-21 MED ORDER — IPRATROPIUM-ALBUTEROL 0.5-2.5 (3) MG/3ML IN SOLN
3.0000 mL | Freq: Four times a day (QID) | RESPIRATORY_TRACT | Status: DC
  Administered 2023-03-21 – 2023-03-22 (×4): 3 mL via RESPIRATORY_TRACT
  Filled 2023-03-21 (×4): qty 3

## 2023-03-21 MED ORDER — AMOXICILLIN 250 MG PO CAPS: 1000.0000 mg | ORAL_CAPSULE | Freq: Three times a day (TID) | ORAL | Status: DC

## 2023-03-21 MED ORDER — VITAMIN B-12 1000 MCG PO TABS
1000.0000 ug | ORAL_TABLET | Freq: Every day | ORAL | Status: DC
  Administered 2023-03-22 – 2023-03-26 (×5): 1000 ug via ORAL
  Filled 2023-03-21 (×5): qty 1

## 2023-03-21 MED ORDER — HYDRALAZINE HCL 20 MG/ML IJ SOLN: 5.0000 mg | Freq: Four times a day (QID) | INTRAMUSCULAR | Status: DC | PRN

## 2023-03-21 MED ORDER — METHYLPREDNISOLONE SODIUM SUCC 125 MG IJ SOLR
80.0000 mg | Freq: Two times a day (BID) | INTRAMUSCULAR | Status: AC
  Administered 2023-03-21 – 2023-03-22 (×2): 80 mg via INTRAVENOUS
  Filled 2023-03-21 (×2): qty 2

## 2023-03-21 MED ORDER — APIXABAN 5 MG PO TABS
5.0000 mg | ORAL_TABLET | Freq: Two times a day (BID) | ORAL | Status: DC
  Administered 2023-03-21 – 2023-03-26 (×10): 5 mg via ORAL
  Filled 2023-03-21 (×11): qty 1

## 2023-03-21 MED ORDER — IPRATROPIUM-ALBUTEROL 0.5-2.5 (3) MG/3ML IN SOLN
3.0000 mL | Freq: Once | RESPIRATORY_TRACT | Status: AC
  Administered 2023-03-21: 3 mL via RESPIRATORY_TRACT
  Filled 2023-03-21: qty 3

## 2023-03-21 MED ORDER — ATORVASTATIN CALCIUM 40 MG PO TABS
80.0000 mg | ORAL_TABLET | Freq: Every day | ORAL | Status: DC
  Administered 2023-03-21 – 2023-03-25 (×5): 80 mg via ORAL
  Filled 2023-03-21 (×6): qty 2

## 2023-03-21 MED ORDER — LEVOTHYROXINE SODIUM 75 MCG PO TABS
175.0000 ug | ORAL_TABLET | Freq: Every morning | ORAL | Status: DC
  Administered 2023-03-22 – 2023-03-26 (×5): 175 ug via ORAL
  Filled 2023-03-21 (×5): qty 1

## 2023-03-21 MED ORDER — FUROSEMIDE 10 MG/ML IJ SOLN
40.0000 mg | Freq: Once | INTRAMUSCULAR | Status: AC
  Administered 2023-03-21: 40 mg via INTRAVENOUS
  Filled 2023-03-21: qty 4

## 2023-03-21 MED ORDER — ALBUTEROL SULFATE (2.5 MG/3ML) 0.083% IN NEBU
2.5000 mg | INHALATION_SOLUTION | Freq: Once | RESPIRATORY_TRACT | Status: AC
  Administered 2023-03-21: 2.5 mg via RESPIRATORY_TRACT
  Filled 2023-03-21: qty 3

## 2023-03-21 MED ORDER — POTASSIUM CHLORIDE CRYS ER 20 MEQ PO TBCR
40.0000 meq | EXTENDED_RELEASE_TABLET | Freq: Once | ORAL | Status: AC
  Administered 2023-03-21: 40 meq via ORAL
  Filled 2023-03-21: qty 2

## 2023-03-21 MED ORDER — ACETAMINOPHEN 325 MG PO TABS
650.0000 mg | ORAL_TABLET | Freq: Four times a day (QID) | ORAL | Status: DC | PRN
  Administered 2023-03-22 – 2023-03-25 (×6): 650 mg via ORAL
  Filled 2023-03-21 (×6): qty 2

## 2023-03-21 MED ORDER — MIRABEGRON ER 25 MG PO TB24
25.0000 mg | ORAL_TABLET | Freq: Every day | ORAL | Status: DC
  Administered 2023-03-21 – 2023-03-26 (×6): 25 mg via ORAL
  Filled 2023-03-21 (×6): qty 1

## 2023-03-21 MED ORDER — ALBUTEROL SULFATE (2.5 MG/3ML) 0.083% IN NEBU
2.5000 mg | INHALATION_SOLUTION | RESPIRATORY_TRACT | Status: DC | PRN
  Administered 2023-03-22: 2.5 mg via RESPIRATORY_TRACT
  Filled 2023-03-21: qty 3

## 2023-03-21 MED ORDER — DILTIAZEM HCL ER COATED BEADS 120 MG PO CP24
120.0000 mg | ORAL_CAPSULE | Freq: Every day | ORAL | Status: DC
  Administered 2023-03-22 – 2023-03-26 (×5): 120 mg via ORAL
  Filled 2023-03-21 (×5): qty 1

## 2023-03-21 MED ORDER — PREDNISONE 20 MG PO TABS
40.0000 mg | ORAL_TABLET | Freq: Every day | ORAL | Status: AC
  Administered 2023-03-23 – 2023-03-26 (×4): 40 mg via ORAL
  Filled 2023-03-21 (×4): qty 2

## 2023-03-21 MED ORDER — FUROSEMIDE 10 MG/ML IJ SOLN
40.0000 mg | Freq: Two times a day (BID) | INTRAMUSCULAR | Status: DC
  Administered 2023-03-22: 40 mg via INTRAVENOUS
  Filled 2023-03-21: qty 4

## 2023-03-21 NOTE — ED Provider Notes (Signed)
[place] EMERGENCY DEPARTMENT AT [facility] Provider Note   CSN: [id number] Arrival date & time: 03/21/23  1210     History  Chief Complaint  Patient presents with   Shortness of Breath    Ann [name] is a 77 y.o. female.  Pt is a 77 yo female with pmhx significant for copd on 2L chronically, gerd, hypothyroidism, hld, dm2, cellulitis, hx dvt, afib (on Eliquis), cad, chf, and sleep apnea.  Pt was admitted from 10/20-23 for a copd exacerbation, BLE cellulitis, and enterococcus faecalis bacteremia.  She was treated with abx and steroids/nebs.  She was weaned back to her normal oxygen usage and was d/c.  Pt feels like she was d/c too soon and was not really ready to go home.  She has been sob since d/c.  She also has some abd pain.  No fevers.  No cp. Pt's O2 sat dropped to the mid-80s when her O2 was weaned down to her regular 2L.  She required 4L to keep O2 sats over 90.          Home Medications Prior to Admission medications   Medication Sig Start Date End Date Taking? Authorizing Provider  acetaminophen (TYLENOL) 325 MG tablet Take 2 tablets (650 mg total) by mouth every 6 (six) hours as needed for mild pain, moderate pain or fever. 12/11/19   Shon Hale, MD  albuterol (VENTOLIN HFA) 108 (90 Base) MCG/ACT inhaler Inhale 2 puffs into the lungs every 4 (four) hours as needed for wheezing. 05/31/20   Angiulli, Mcarthur Rossetti, PA-C  amoxicillin (AMOXIL) 500 MG capsule Take 2 capsules (1,000 mg total) by mouth every 8 (eight) hours for 11 days. 03/19/23 03/30/23  Vassie Loll, MD  apixaban (ELIQUIS) 5 MG TABS tablet Take 1 tablet (5 mg total) by mouth 2 (two) times daily. 12/20/22 12/15/23  Sharlene Dory, NP  atorvastatin (LIPITOR) 80 MG tablet Take 1 tablet (80 mg total) by mouth daily at 6 PM. 12/20/22   Sharlene Dory, NP  augmented betamethasone dipropionate (DIPROLENE-AF) 0.05 % cream Apply 1 Application topically daily. 12/31/22   [provider]   bisoprolol (ZEBETA) 5 MG tablet TAKE 1 TABLET(5 MG) BY MOUTH DAILY 03/17/23   Antoine Poche, MD  budesonide (PULMICORT) 0.25 MG/2ML nebulizer solution One vial first thing in am and with evening dose of duoneb Patient taking differently: Take 0.25 mg by nebulization in the morning and at bedtime. 03/10/23   Nyoka Cowden, MD  Carboxymethylcellulose Sod PF 0.5 % SOLN Place 2 drops into both eyes daily as needed (for dry eye relief).     [provider]  Cholecalciferol (VITAMIN D3) 125 MCG (5000 UT) CAPS Take 1 capsule (5,000 Units total) by mouth daily. 05/31/20   Angiulli, Mcarthur Rossetti, PA-C  cyanocobalamin 1000 MCG tablet Take 1 tablet (1,000 mcg total) by mouth daily. Patient taking differently: Take 2,500 mcg by mouth daily. 05/31/20   Angiulli, Mcarthur Rossetti, PA-C  diltiazem (CARDIZEM CD) 120 MG 24 hr capsule Take 1 capsule (120 mg total) by mouth daily. 12/20/22   Sharlene Dory, NP  diltiazem (CARDIZEM) 30 MG tablet Take 1 tablet (30 mg total) by mouth 2 (two) times daily as needed (For heart Rate greater Than 120 BPM). 12/20/22   Sharlene Dory, NP  esomeprazole (NEXIUM) 40 MG capsule Take 1 capsule (40 mg total) by mouth 2 (two) times daily before a meal. 01/30/23 01/30/24  Lanelle Bal, DO  furosemide (LASIX) 40 MG tablet Take 1  tablet (40 mg total) by mouth 2 (two) times daily. 12/20/22 03/20/23  Sharlene Dory, NP  gabapentin (NEURONTIN) 400 MG capsule Take 1 capsule (400 mg total) by mouth 3 (three) times daily. 05/31/20   Angiulli, Mcarthur Rossetti, PA-C  GLIPIZIDE XL 5 MG 24 hr tablet TAKE ONE TABLET BY MOUTH EVERY DAY WITH BREAKFAST Patient taking differently: Take 5 mg by mouth daily with breakfast. 12/04/22   Nida, Denman George, MD  guaiFENesin (MUCINEX) 600 MG 12 hr tablet Take 1 tablet (600 mg total) by mouth 2 (two) times daily as needed. 12/25/22   Particia Nearing, PA-C  hydrocortisone valerate cream (WESTCORT) 0.2 % Apply topically 2 (two) times daily. Apply to sparingly to  both legs for treatment of stasis dermatitis. 03/19/23   Vassie Loll, MD  Insulin Lispro Prot & Lispro (HUMALOG 75/25 MIX) (75-25) 100 UNIT/ML Kwikpen Inject 40-50 Units into the skin 2 (two) times daily before a meal. 02/27/23   Nida, Denman George, MD  ipratropium-albuterol (DUONEB) 0.5-2.5 (3) MG/3ML SOLN Take 3 mLs by nebulization 4 (four) times daily. 03/10/23   Nyoka Cowden, MD  Iron, Ferrous Sulfate, 325 (65 Fe) MG TABS Take 325 mg by mouth daily. 10/24/22   Del Nigel Berthold, FNP  levothyroxine (SYNTHROID) 175 MCG tablet Take 1 tablet (175 mcg total) by mouth every morning. 11/16/20   Nida, Denman George, MD  Menthol-Methyl Salicylate (SALONPAS PAIN RELIEF PATCH) PTCH Apply 1 patch topically daily as needed (pain).    [provider]  nitroGLYCERIN (NITROSTAT) 0.4 MG SL tablet Place 1 tablet (0.4 mg total) under the tongue every 5 (five) minutes as needed for chest pain. 12/20/22   Sharlene Dory, NP  nystatin (MYCOSTATIN/NYSTOP) powder Apply topically 3 (three) times daily. Apply to to affected areas (groin/skin folds) 12/06/22   Azucena Fallen, MD  predniSONE (DELTASONE) 10 MG tablet Take 4 tablets by mouth daily x 2 days; then 3 tablets by mouth daily x 2 days; then 2 tablets by mouth daily x 3 days; then resume daily 10 mg prednisone tablet as previously prescribed. 03/19/23   Vassie Loll, MD  triamcinolone cream (KENALOG) 0.1 % Apply 1 Application topically 2 (two) times daily. Patient taking differently: Apply 1 Application topically 2 (two) times daily as needed (for skin rash/irritation). 11/07/22   Vu, Tonita Phoenix, MD  Vibegron (GEMTESA) 75 MG TABS Take 1 tablet (75 mg total) by mouth daily. 08/19/22   McKenzie, Mardene Celeste, MD      Allergies    Ace inhibitors, Jonne Ply [aspirin], Breztri aerosphere [budeson-glycopyrrol-formoterol], Tape, Niacin, and Reglan [metoclopramide]    Review of Systems   Review of Systems  Constitutional:  Positive for fatigue.   Respiratory:  Positive for shortness of breath.   Cardiovascular:  Positive for leg swelling.  All other systems reviewed and are negative.   Physical Exam Updated Vital Signs BP 132/86 (BP Location: Right Wrist)   Pulse 78   Temp 97.6 F (36.4 C) (Oral)   Resp 20   Ht 5\' 3"  (1.6 m)   Wt 126 kg   SpO2 95%   BMI 49.21 kg/m  Physical Exam Vitals and nursing note reviewed.  Constitutional:      Appearance: She is well-developed. She is obese.  HENT:     Head: Normocephalic and atraumatic.     Mouth/Throat:     Mouth: Mucous membranes are moist.  Eyes:     Extraocular Movements: Extraocular movements intact.     Pupils: Pupils are  equal, round, and reactive to light.  Cardiovascular:     Rate and Rhythm: Tachycardia present. Rhythm irregular.  Pulmonary:     Effort: Tachypnea present.     Breath sounds: Wheezing and rhonchi present.  Abdominal:     Palpations: Abdomen is soft.     Tenderness: There is abdominal tenderness.  Musculoskeletal:     Cervical back: Normal range of motion and neck supple.     Right lower leg: Edema present.     Left lower leg: Edema present.  Skin:    General: Skin is warm.     Capillary Refill: Capillary refill takes less than 2 seconds.  Neurological:     General: No focal deficit present.     Mental Status: She is alert and oriented to person, place, and time.  Psychiatric:        Mood and Affect: Mood normal.        Behavior: Behavior normal.     ED Results / Procedures / Treatments   Labs (all labs ordered are listed, but only abnormal results are displayed) Labs Reviewed  BRAIN NATRIURETIC PEPTIDE  CBC WITH DIFFERENTIAL/PLATELET  COMPREHENSIVE METABOLIC PANEL  URINALYSIS, W/ REFLEX TO CULTURE (INFECTION SUSPECTED)  LACTIC ACID, PLASMA  LACTIC ACID, PLASMA  TROPONIN I (HIGH SENSITIVITY)    EKG None  Radiology No results found.  Procedures Procedures    Medications Ordered in ED Medications - No data to  display  ED Course/ Medical Decision Making/ A&P                                 Medical Decision Making Amount and/or Complexity of Data Reviewed Labs: ordered. Radiology: ordered.  Risk Prescription drug management. Decision regarding hospitalization.   This patient presents to the ED for concern of sob, this involves an extensive number of treatment options, and is a complaint that carries with it a high risk of complications and morbidity.  The differential diagnosis includes copd/chf/covid/flu/rsv   Co morbidities that complicate the patient evaluation  copd on 2L chronically, gerd, hypothyroidism, hld, dm2, cellulitis, hx dvt, afib (on Eliquis), cad, chf, and sleep apnea   Additional history obtained:  Additional history obtained from epic chart review External records from outside source obtained and reviewed including EMS report   Lab Tests:  I Ordered, and personally interpreted labs.  The pertinent results include:  cbc with wbc sl elevated at 12.2, ua + ketones, covid/flu neg, cmp with k sl low at 3.1   Imaging Studies ordered:  I ordered imaging studies including cxr  I independently visualized and interpreted imaging which showed  Opacity at the lateral left lung base with blunting of the left  lateral costophrenic angle. This may reflect a small left pleural  effusion, atelectasis and/or airspace consolidation. These findings  are new from the prior examination of 03/16/2023.  2. Foci of scarring within the mid left lung.  3. Prominence of the main pulmonary artery contour suggesting  pulmonary arterial hypertension.  4. Cardiomegaly.  5. Aortic Atherosclerosis (ICD10-I70.0).   I agree with the radiologist interpretation   Cardiac Monitoring:  The patient was maintained on a cardiac monitor.  I personally viewed and interpreted the cardiac monitored which showed an underlying rhythm of: fib   Medicines ordered and prescription drug  management:  I ordered medication including albuterol/solumedrol/lasix  for sx  Reevaluation of the patient after these medicines showed that the patient  improved I have reviewed the patients home medicines and have made adjustments as needed   Test Considered:  cxr   Critical Interventions:  oxygen   Consultations Obtained:  I requested consultation with the hospitalist (Dr. Randol Kern),  and discussed lab and imaging findings as well as pertinent plan - the will admit   Problem List / ED Course:  Copd exac/resp failure:  O2 sats in the mid-90s on 4L.  Pt is feeling better, but is still sob.  Pt will need admission for further improvement.   Reevaluation:  After the interventions noted above, I reevaluated the patient and found that they have :improved   Social Determinants of Health:  Lives at home   Dispostion:  After consideration of the diagnostic results and the patients response to treatment, I feel that the patent would benefit from admission.    CRITICAL CARE Performed by: Jacalyn Lefevre   Total critical care time: 30 minutes  Critical care time was exclusive of separately billable procedures and treating other patients.  Critical care was necessary to treat or prevent imminent or life-threatening deterioration.  Critical care was time spent personally by me on the following activities: development of treatment plan with patient and/or surrogate as well as nursing, discussions with consultants, evaluation of patient's response to treatment, examination of patient, obtaining history from patient or surrogate, ordering and performing treatments and interventions, ordering and review of laboratory studies, ordering and review of radiographic studies, pulse oximetry and re-evaluation of patient's condition.         Final Clinical Impression(s) / ED Diagnoses Final diagnoses:  None    Rx / DC Orders ED Discharge Orders     None          Jacalyn Lefevre, MD 03/21/23 1538

## 2023-03-21 NOTE — ED Triage Notes (Signed)
Pt BIB EMS for shortness of breath, dizziness. Per EMS pt recently seen for an infection and new onset of a-fib. Pt did not drop below 93% with EMS but was increased to 6L to help with the increased SOB. Pt is on chronic o2 at 3L.
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
## 2023-03-22 ENCOUNTER — Inpatient Hospital Stay (HOSPITAL_COMMUNITY): Payer: Medicare HMO

## 2023-03-22 DIAGNOSIS — I5032 Chronic diastolic (congestive) heart failure: Secondary | ICD-10-CM | POA: Diagnosis not present

## 2023-03-22 DIAGNOSIS — I482 Chronic atrial fibrillation, unspecified: Secondary | ICD-10-CM | POA: Diagnosis not present

## 2023-03-22 DIAGNOSIS — N1831 Chronic kidney disease, stage 3a: Secondary | ICD-10-CM | POA: Diagnosis not present

## 2023-03-22 DIAGNOSIS — J441 Chronic obstructive pulmonary disease with (acute) exacerbation: Secondary | ICD-10-CM | POA: Diagnosis not present

## 2023-03-22 LAB — CULTURE, BLOOD (ROUTINE X 2)
Culture: NO GROWTH
Culture: NO GROWTH

## 2023-03-22 LAB — BASIC METABOLIC PANEL
Anion gap: 15 (ref 5–15)
BUN: 20 mg/dL (ref 8–23)
CO2: 28 mmol/L (ref 22–32)
Calcium: 8.6 mg/dL — ABNORMAL LOW (ref 8.9–10.3)
Chloride: 93 mmol/L — ABNORMAL LOW (ref 98–111)
Creatinine, Ser: 0.65 mg/dL (ref 0.44–1.00)
GFR, Estimated: >60 mL/min (ref >60)
Glucose, Bld: 315 mg/dL — ABNORMAL HIGH (ref 70–99)
Potassium: 4.5 mmol/L (ref 3.5–5.1)
Sodium: 136 mmol/L (ref 135–145)

## 2023-03-22 LAB — BLOOD GAS, VENOUS
Acid-Base Excess: 12.9 mmol/L — ABNORMAL HIGH (ref 0.0–2.0)
Bicarbonate: 36.7 mmol/L — ABNORMAL HIGH (ref 20.0–28.0)
Drawn by: 27160
O2 Saturation: 77.2 %
Patient temperature: 36.4
pCO2, Ven: 41 mm[Hg] — ABNORMAL LOW (ref 44–60)
pH, Ven: 7.56 — ABNORMAL HIGH (ref 7.25–7.43)
pO2, Ven: 40 mm[Hg] (ref 32–45)

## 2023-03-22 LAB — CBC
HCT: 42.1 % (ref 36.0–46.0)
Hemoglobin: 13.6 g/dL (ref 12.0–15.0)
MCH: 29.8 pg (ref 26.0–34.0)
MCHC: 32.3 g/dL (ref 30.0–36.0)
MCV: 92.1 fL (ref 80.0–100.0)
Platelets: 237 10*3/uL (ref 150–400)
RBC: 4.57 MIL/uL (ref 3.87–5.11)
RDW: 12.8 % (ref 11.5–15.5)
WBC: 9.5 10*3/uL (ref 4.0–10.5)
nRBC: 0 % (ref 0.0–0.2)

## 2023-03-22 LAB — GLUCOSE, CAPILLARY
Glucose-Capillary: 334 mg/dL — ABNORMAL HIGH (ref 70–99)
Glucose-Capillary: 383 mg/dL — ABNORMAL HIGH (ref 70–99)
Glucose-Capillary: 335 mg/dL — ABNORMAL HIGH (ref 70–99)
Glucose-Capillary: 286 mg/dL — ABNORMAL HIGH (ref 70–99)

## 2023-03-22 MED ORDER — INSULIN ASPART 100 UNIT/ML IJ SOLN
0.0000 [IU] | Freq: Three times a day (TID) | INTRAMUSCULAR | Status: DC
  Administered 2023-03-22: 11 [IU] via SUBCUTANEOUS
  Administered 2023-03-23: 15 [IU] via SUBCUTANEOUS
  Administered 2023-03-23: 8 [IU] via SUBCUTANEOUS
  Administered 2023-03-23: 11 [IU] via SUBCUTANEOUS
  Administered 2023-03-24: 8 [IU] via SUBCUTANEOUS
  Administered 2023-03-24: 2 [IU] via SUBCUTANEOUS
  Administered 2023-03-24 – 2023-03-25 (×3): 3 [IU] via SUBCUTANEOUS
  Administered 2023-03-26: 2 [IU] via SUBCUTANEOUS

## 2023-03-22 MED ORDER — AMOXICILLIN 250 MG PO CAPS
1000.0000 mg | ORAL_CAPSULE | Freq: Three times a day (TID) | ORAL | Status: DC
  Administered 2023-03-22 – 2023-03-26 (×12): 1000 mg via ORAL
  Filled 2023-03-22 (×12): qty 4

## 2023-03-22 NOTE — Progress Notes (Addendum)
Amion sent to Dr. Ella Jubilee, patient c/o increasing SOB and respirations. Audible wheezing and crackles in lungs. Sat 92% on 2L nasal canula. Patient states she is feeling extremely weak and worn out. Chest xray and ABG ordered by Dr. Ella Jubilee.

## 2023-03-22 NOTE — Assessment & Plan Note (Signed)
Patient continue to have dyspnea, her 02 saturation is 94% on 3.4 L/min per [place].   Plan to continue bronchodilator therapy, inhaled and systemic corticosteroids.  Airway clearing techniques with flutter valve and incentive spirometer.

## 2023-03-22 NOTE — TOC Initial Note (Addendum)
Transition of Care [facility]) - Initial/Assessment Note    Patient Details  Name: Ann [name] MRN: [id number] Date of Birth: Jul 18, 1945  Transition of Care [facility]) CM/SW Contact:    Catalina Gravel, LCSW Phone Number: 03/22/2023, 1:46 PM  Clinical Narrative:                 PT scored high for 30 Readmission risk.  Assessed within past 5 days.  See below:   Lives with nine family members- includes spouse adult children and grand-children within [place] home. Uses walker at home and wheelchair outside of home. Patient explains is needing more assistance with ADLs especially with getting in/out of the shower and cooking meals. Oldest son takes patient to appointments and errands. Pt says daughter sometimes will do the cooking but mostly everyone in the house works so does not have a lot of help.  Tries to eat a heart healthy diet. Not able to weigh herself due to not being able to balance self on the scale therefore relies on the scales at doctor's office.  Compliant with taking medications and has no questions about medications. Pt also said not able to think of any further needs at this time.    Barriers to Discharge: Continued Medical Work up   Patient Goals and CMS Choice            Expected Discharge Plan and Services                                              Prior Living Arrangements/Services                       Activities of Daily Living   ADL Screening (condition at time of admission) Independently performs ADLs?: Yes (appropriate for developmental age) Is the patient deaf or have difficulty hearing?: No Does the patient have difficulty seeing, even when wearing glasses/contacts?: No Does the patient have difficulty concentrating, remembering, or making decisions?: No  Permission Sought/Granted                  Emotional Assessment              Admission diagnosis:  Hypokalemia [E87.6] COPD exacerbation (HCC) [J44.1] Acute  respiratory failure with hypoxia (HCC) [J96.01] Patient Active Problem List   Diagnosis Date Noted   COPD exacerbation (HCC) 03/21/2023   Cellulitis 03/17/2023   Acute on chronic respiratory failure with hypoxia (HCC) 12/03/2022   Iron deficiency anemia due to chronic blood loss 10/29/2022   Elevated brain natriuretic peptide (BNP) level 09/17/2022   Chronic respiratory failure with hypoxia (HCC) 04/30/2022   History of colostomy reversal 12/18/2020   Abnormality of gait 07/04/2020   Colostomy in place [facility]) 06/13/2020   Colostomy, retracted (HCC) 06/13/2020   Labile blood glucose    Supplemental oxygen dependent    Steroid-induced hyperglycemia    Controlled type 2 diabetes mellitus with hyperglycemia, with long-term current use of insulin (HCC)    Hypoalbuminemia due to protein-calorie malnutrition (HCC)    Chronic diastolic congestive heart failure (HCC)    Acute blood loss anemia    Debility 05/25/2020   Sigmoid Diverticulitis with Perforation-- 05/15/2020   Diverticulitis of large intestine with perforation with bleeding    Sepsis Due to Sigmoid Diverticulitis with Perforation 05/14/2020   Acute GI bleeding 05/14/2020   Acute  on chronic anemia-Due to Acute Blood Loss 05/14/2020   Acute respiratory failure with hypoxia (HCC) 01/24/2020   Atrial fibrillation with RVR (HCC) 01/23/2020   Anxiety    Uncontrolled type 2 diabetes mellitus with hyperglycemia, with long-term current use of insulin (HCC)    Weakness 12/10/2019   Acute pain of left knee 08/31/2019   History of DVT (deep vein thrombosis)    Coronary artery disease of native artery of native heart with stable angina pectoris (HCC)    COPD GOLD 0 / 02 dep / prob Cor pulmonale    Atrial fibrillation with rapid ventricular response (HCC) 07/13/2019   COPD with acute exacerbation (HCC) 04/04/2018   Comprehensive diabetic foot examination, type 2 DM, encounter for (HCC) 04/04/2018   PAF (paroxysmal atrial fibrillation) (HCC)  04/04/2018   Constipation by delayed colonic transit 10/23/2016   Fatty liver 12/11/2015   GERD without esophagitis 07/14/2015   Hypotension 03/15/2013   Elevated troponin 03/15/2013   VRE (vancomycin resistant enterococcus) culture positive 10/10/2012   Nonsustained ventricular tachycardia (HCC) 10/10/2012   Cellulitis of arm, right 10/06/2012   Hydronephrosis 09/15/2012   Abdominal mass, right lower quadrant 09/10/2012   Microcytic anemia 08/22/2012   Acute on chronic diastolic heart failure (HCC) 08/06/2012   UTI (urinary tract infection) 07/30/2012   Atrial fibrillation, chronic (HCC) 07/02/2012   Vitamin B12 deficiency 06/28/2012   Sinusitis chronic, frontal 06/28/2012   Morbid obesity with BMI of 50.0-59.9, adult (HCC) 06/28/2012   Chronic kidney disease, stage III (moderate) (HCC) 06/28/2012   ARF (acute renal failure) (HCC) 06/27/2012   Long term current use of anticoagulant therapy 06/27/2012   DOE (dyspnea on exertion) 06/27/2012   Adrenal insufficiency (HCC) 01/07/2012   Uncontrolled type 2 diabetes mellitus with hyperglycemia (HCC) 04/19/2011   Chronic diastolic heart failure (HCC) 04/19/2011   GANGLION OF TENDON SHEATH 02/21/2010   Acquired hypothyroidism 12/01/2008   Mixed hyperlipidemia 12/01/2008   Essential hypertension 12/01/2008   Coronary atherosclerosis 12/01/2008   RENAL FAILURE, CHRONIC 12/01/2008   PCP:  Benetta Spar, MD Pharmacy:   [facility] [phone] - [place], [place] - [address] [place] [address] Phone: [phone] Fax: [phone]  [facility] - [place], [place] - [address] [facility] [address] [place] [address] Phone: [phone] Fax: [phone]     Social Determinants of Health (SDOH) Social History: SDOH Screenings   Food Insecurity: No Food Insecurity (03/21/2023)  Housing: Low Risk  (03/21/2023)  Transportation Needs:  No Transportation Needs (03/21/2023)  Utilities: Not At Risk (03/21/2023)  Alcohol Screen: Low Risk  (10/22/2022)  Depression (PHQ2-9): Low Risk  (10/30/2022)  Recent Concern: Depression (PHQ2-9) - High Risk (10/16/2022)  Financial Resource Strain: Low Risk  (10/22/2022)  Physical Activity: Insufficiently Active (10/22/2022)  Social Connections: Moderately Integrated (10/22/2022)  Stress: No Stress Concern Present (10/22/2022)  Tobacco Use: Medium Risk (03/21/2023)   SDOH Interventions:     Readmission Risk Interventions    03/17/2023    5:19 PM 12/04/2022   10:26 AM 09/18/2022    8:34 AM  Readmission Risk Prevention Plan  Transportation Screening Complete Complete Complete  HRI or Home Care Consult Complete Complete Complete  Social Work Consult for Recovery Care Planning/Counseling Complete Complete Complete  Palliative Care Screening Not Applicable Not Applicable Not Applicable  Medication Review Oceanographer) Complete Complete Complete

## 2023-03-22 NOTE — Progress Notes (Signed)
Pt given tylenol for c/o frontal headache. Pt tolerated food/fluids without difficulty. Resp even and unlabored while in bed. Took bites of her supper meal, states, "I just ain't got no appetite." Pt assisted up to [facility], required standby assist for steadying and FWW. Pt had dyspnea on exertion but otherwise tolerated movement well. Had moderate sized bowel movement. Pt cleaned and back to bed.

## 2023-03-22 NOTE — Progress Notes (Signed)
Pt sleeping at present. Resp 24-26/min, unlabored at present. Skin warm and dry, pt arouses to name called. States headache "better". States just doesn't feel good but no specific complaint.  Bed alarm on, call bell within reach. Advised to call for needs.

## 2023-03-22 NOTE — Hospital Course (Signed)
Mrs. Tyminski was admitted to the hospital with the working diagnosis of COPD exacerbation.   77 yo female with the past medical history of COPD, chronic hypoxia, diastolic heart failure, atrial fibrillation, hypertension, T2DM, and DVT who presented with dyspnea.  Recent hospitalization for COPD, and cellulitis with bacteremia. (10/20 to 03/19/23).  At home continue to have worsening dyspnea and wheezing. In the ED she required 6 L/min per , her blood pressure was 121/78, HR 89, RR 18 and 02 saturation 94%, lungs with bilateral wheezing and decreased ventilation, heart with S1 and S2 present and irregular, abdomen with no distention and no lower extremity edema.   Chest radiograph with cardiomegaly, with prominent pulmonary vasculature at the hilar region, opacity at the left lower lobe, possible effusion or atelectasis.

## 2023-03-22 NOTE — Progress Notes (Signed)
   03/22/23 1346  TOC Brief Assessment  Insurance and Status Reviewed  Patient has primary care physician Yes  Home environment has been reviewed From Home  Prior level of function: Independent  Prior/Current Home Services Current home services (Was recently referred to [facility])  Social Determinants of Health Reivew SDOH reviewed no interventions necessary  Readmission risk has been reviewed Yes  Transition of care needs transition of care needs identified, TOC will continue to follow       Transition of Care Department (TOC) has reviewed patient and no TOC needs have been identified at this time. We will continue to monitor patient advancement through interdisciplinary progression rounds. If new patient transition needs arise, please place a TOC consult.

## 2023-03-22 NOTE — Assessment & Plan Note (Addendum)
Continue rate control with bisoprolol and diltiazem.  Anticoagulation with apixaban.

## 2023-03-22 NOTE — Assessment & Plan Note (Signed)
Continue insulin 70/30  Will add sliding scale for glucose cover and monitoring.  Fasting glucose today 315.

## 2023-03-22 NOTE — Progress Notes (Signed)
Progress Note   Patient: Ann [name] ZOX:[id number] DOB: 09/25/1945 DOA: 03/21/2023     1 DOS: the patient was seen and examined on 03/22/2023   Brief hospital course: Ann [name] was admitted to the hospital with the working diagnosis of COPD exacerbation.   77 yo female with the past medical history of COPD, chronic hypoxia, diastolic heart failure, atrial fibrillation, hypertension, T2DM, and DVT who presented with dyspnea.  Recent hospitalization for COPD, and cellulitis with bacteremia. (10/20 to 03/19/23).  At home continue to have worsening dyspnea and wheezing. In the ED she required 6 L/min per [place], her blood pressure was 121/78, HR 89, RR 18 and 02 saturation 94%, lungs with bilateral wheezing and decreased ventilation, heart with S1 and S2 present and irregular, abdomen with no distention and no lower extremity edema.   Chest radiograph with cardiomegaly, with prominent pulmonary vasculature at the hilar region, opacity at the left lower lobe, possible effusion or atelectasis.      Assessment and Plan: COPD with acute exacerbation (HCC) Patient continue to have dyspnea, her 02 saturation is 94% on 3.4 L/min per [place].   Plan to continue bronchodilator therapy, inhaled and systemic corticosteroids.  Airway clearing techniques with flutter valve and incentive spirometer.   Atrial fibrillation, chronic (HCC) Continue rate control with bisoprolol and diltiazem.  Anticoagulation with apixaban.    Chronic diastolic congestive heart failure (HCC) Echocardiogram with preserved LV systolic function with EF 60 to 65%, mild LVH, RV systolic function preserved.  LA with moderate dilatation, trivial pericardial effusion.   Patient with improvement in her volume status.  Will hold on furosemide for now.  Continue blood pressure monitoring.   Chronic kidney disease, stage III (moderate) (HCC) Renal function with serum cr at 4,5 with serum bicarbonate at 28. Na 136   Plan to  continue close follow up renal function and electrolytes.   Acquired hypothyroidism Continue levothyroxine   Essential hypertension Continue blood pressure monitoring.   Controlled type 2 diabetes mellitus with hyperglycemia, with long-term current use of insulin (HCC) Continue insulin 70/30  Will add sliding scale for glucose cover and monitoring.  Fasting glucose today 315.  VRE (vancomycin resistant enterococcus) culture positive Enterococcal bacteremia, resume regimen with oral amoxicillin.         Subjective: Patient very weak and deconditioned, not feeling well today, continue to have dyspnea, no chest pain   Physical Exam: Vitals:   03/22/23 0305 03/22/23 0619 03/22/23 0735 03/22/23 1128  BP: (!) 143/98 (!) 145/91  130/87  Pulse: 94 76  72  Resp: 18 18  20   Temp: 97.8 F (36.6 C) 97.6 F (36.4 C)  (!) 97.4 F (36.3 C)  TempSrc: Oral Oral  Oral  SpO2: 96% 97% 98% 94%  Weight:      Height:       Neurology awake and alert, deconditioned and ill looking appearing ENT with positive pallor Cardiovascular with S1 and S2 present with no gallops, rubs or murmurs Respiratory with prolonged expiratory phase and expiratory wheezing, no rhonchi Abdomen with no distention  Trace lower extremity edema, non pitting,  Data Reviewed:    Family Communication: no family at the bedside   Disposition: Status is: Inpatient Remains inpatient appropriate because: COPD exacerbation   Planned Discharge Destination: Skilled nursing facility    Author: Coralie Keens, MD 03/22/2023 12:43 PM  For on call review [email or web address].

## 2023-03-22 NOTE — Assessment & Plan Note (Signed)
Continue blood pressure monitoring

## 2023-03-22 NOTE — Assessment & Plan Note (Signed)
Renal function with serum cr at 4,5 with serum bicarbonate at 28. Na 136   Plan to continue close follow up renal function and electrolytes.

## 2023-03-22 NOTE — Assessment & Plan Note (Signed)
Enterococcal bacteremia, resume regimen with oral amoxicillin.

## 2023-03-22 NOTE — Assessment & Plan Note (Signed)
Continue levothyroxine 

## 2023-03-22 NOTE — Assessment & Plan Note (Signed)
Echocardiogram with preserved LV systolic function with EF 60 to 65%, mild LVH, RV systolic function preserved.  LA with moderate dilatation, trivial pericardial effusion.   Patient with improvement in her volume status.  Will hold on furosemide for now.  Continue blood pressure monitoring.

## 2023-03-23 DIAGNOSIS — I5032 Chronic diastolic (congestive) heart failure: Secondary | ICD-10-CM | POA: Diagnosis not present

## 2023-03-23 DIAGNOSIS — I482 Chronic atrial fibrillation, unspecified: Secondary | ICD-10-CM | POA: Diagnosis not present

## 2023-03-23 DIAGNOSIS — N1831 Chronic kidney disease, stage 3a: Secondary | ICD-10-CM | POA: Diagnosis not present

## 2023-03-23 DIAGNOSIS — J441 Chronic obstructive pulmonary disease with (acute) exacerbation: Secondary | ICD-10-CM | POA: Diagnosis not present

## 2023-03-23 LAB — CBC
HCT: 40.2 % (ref 36.0–46.0)
Hemoglobin: 12.9 g/dL (ref 12.0–15.0)
MCH: 29.7 pg (ref 26.0–34.0)
MCHC: 32.1 g/dL (ref 30.0–36.0)
MCV: 92.4 fL (ref 80.0–100.0)
Platelets: 229 10*3/uL (ref 150–400)
RBC: 4.35 MIL/uL (ref 3.87–5.11)
RDW: 12.9 % (ref 11.5–15.5)
WBC: 14.2 10*3/uL — ABNORMAL HIGH (ref 4.0–10.5)
nRBC: 0 % (ref 0.0–0.2)

## 2023-03-23 LAB — BASIC METABOLIC PANEL
Anion gap: 13 (ref 5–15)
BUN: 29 mg/dL — ABNORMAL HIGH (ref 8–23)
CO2: 29 mmol/L (ref 22–32)
Calcium: 8.7 mg/dL — ABNORMAL LOW (ref 8.9–10.3)
Chloride: 92 mmol/L — ABNORMAL LOW (ref 98–111)
Creatinine, Ser: 0.74 mg/dL (ref 0.44–1.00)
GFR, Estimated: >60 mL/min (ref >60)
Glucose, Bld: 243 mg/dL — ABNORMAL HIGH (ref 70–99)
Potassium: 4 mmol/L (ref 3.5–5.1)
Sodium: 134 mmol/L — ABNORMAL LOW (ref 135–145)

## 2023-03-23 LAB — GLUCOSE, CAPILLARY
Glucose-Capillary: 317 mg/dL — ABNORMAL HIGH (ref 70–99)
Glucose-Capillary: 281 mg/dL — ABNORMAL HIGH (ref 70–99)
Glucose-Capillary: 368 mg/dL — ABNORMAL HIGH (ref 70–99)
Glucose-Capillary: 276 mg/dL — ABNORMAL HIGH (ref 70–99)

## 2023-03-23 MED ORDER — SUMATRIPTAN SUCCINATE 50 MG PO TABS
25.0000 mg | ORAL_TABLET | Freq: Once | ORAL | Status: AC
  Administered 2023-03-23: 25 mg via ORAL
  Filled 2023-03-23: qty 1

## 2023-03-23 MED ORDER — IPRATROPIUM-ALBUTEROL 0.5-2.5 (3) MG/3ML IN SOLN
3.0000 mL | Freq: Four times a day (QID) | RESPIRATORY_TRACT | Status: DC
  Administered 2023-03-23 (×3): 3 mL via RESPIRATORY_TRACT
  Filled 2023-03-23 (×3): qty 3

## 2023-03-23 NOTE — Progress Notes (Signed)
Progress Note   Patient: Ann [name] AVW:[id number] DOB: Jul 10, 1945 DOA: 03/21/2023     2 DOS: the patient was seen and examined on 03/23/2023   Brief hospital course: Mrs. Lesure was admitted to the hospital with the working diagnosis of COPD exacerbation.   77 yo female with the past medical history of COPD, chronic hypoxia, diastolic heart failure, atrial fibrillation, hypertension, T2DM, and DVT who presented with dyspnea.  Recent hospitalization for COPD, and cellulitis with bacteremia. (10/20 to 03/19/23).  At home continue to have worsening dyspnea and wheezing. In the ED she required 6 L/min per [place], her blood pressure was 121/78, HR 89, RR 18 and 02 saturation 94%, lungs with bilateral wheezing and decreased ventilation, heart with S1 and S2 present and irregular, abdomen with no distention and no lower extremity edema.   Chest radiograph with cardiomegaly, with prominent pulmonary vasculature at the hilar region, opacity at the left lower lobe, possible effusion or atelectasis.      Assessment and Plan: COPD with acute exacerbation (HCC) Dyspnea has improved but not back to baseline, continue with wheezing.   Continue bronchodilator therapy, inhaled and systemic corticosteroids.  Airway clearing techniques with flutter valve and incentive spirometer.  PT and OT Out of bed tid with meals.   Atrial fibrillation, chronic (HCC) Continue rate control with bisoprolol and diltiazem.  Anticoagulation with apixaban.    Chronic diastolic congestive heart failure (HCC) Echocardiogram with preserved LV systolic function with EF 60 to 65%, mild LVH, RV systolic function preserved.  LA with moderate dilatation, trivial pericardial effusion.   Patient with improvement in her volume status.  Holding on furosemide. Continue blood pressure monitoring.   Chronic kidney disease, stage III (moderate) (HCC) Renal function with serum cr at 0,74 with K at 4,0 and serum bicarbonate at  29, Na 134   Plan to continue close follow up renal function and electrolytes.   Acquired hypothyroidism Continue levothyroxine   Essential hypertension Continue blood pressure monitoring.   Controlled type 2 diabetes mellitus with hyperglycemia, with long-term current use of insulin (HCC) Continue insulin 70/30  Sliding scale for glucose cover and monitoring.  Fasting glucose today 243  VRE (vancomycin resistant enterococcus) culture positive Enterococcal bacteremia, resume regimen with oral amoxicillin.         Subjective: Patient with improvement in dyspnea but not back to baseline, today having migraine type headache, no chest pain, she has been very weak and not out of bed yet,   Physical Exam: Vitals:   03/22/23 2049 03/23/23 0503 03/23/23 0854 03/23/23 1301  BP: 115/77 135/87 134/86   Pulse: 86 (!) 57 76   Resp: 18 20    Temp: 98.1 F (36.7 C) 98.4 F (36.9 C)    TempSrc: Oral Oral    SpO2: 92% 95% 95% 96%  Weight:  122.9 kg    Height:       Neurology awake and alert ENT with mild pallor Cardiovascular with S1 and S2 present, irregularly irregular with no gallops, rubs or murmurs Respiratory with prolonged expiratory phase, positive expiratory wheezing with no rhonchi Abdomen with no distention  No lower extremity edema  Data Reviewed:    Family Communication: I spoke with patient's daughter at the bedside, we talked in detail about patient's condition, plan of care and prognosis and all questions were addressed.   Disposition: Status is: Inpatient Remains inpatient appropriate because: recovering copd exacerbation   Planned Discharge Destination: Home    Author: Coralie Keens, MD 03/23/2023  2:19 PM  For on call review [email or web address].

## 2023-03-23 NOTE — Plan of Care (Signed)
  Problem: Education: Goal: Knowledge of disease or condition will improve Outcome: Progressing Goal: Knowledge of the prescribed therapeutic regimen will improve Outcome: Progressing   Problem: Activity: Goal: Ability to tolerate increased activity will improve Outcome: Progressing Goal: Will verbalize the importance of balancing activity with adequate rest periods Outcome: Progressing   Problem: Respiratory: Goal: Ability to maintain a clear airway will improve Outcome: Progressing Goal: Ability to maintain adequate ventilation will improve Outcome: Progressing   Problem: Education: Goal: Knowledge of General Education information will improve Description: Including pain rating scale, medication(s)/side effects and non-pharmacologic comfort measures Outcome: Progressing   Problem: Clinical Measurements: Goal: Respiratory complications will improve Outcome: Progressing   Problem: Activity: Goal: Risk for activity intolerance will decrease Outcome: Progressing   Problem: Elimination: Goal: Will not experience complications related to urinary retention Outcome: Progressing   Problem: Pain Management: Goal: General experience of comfort will improve Outcome: Progressing   Problem: Safety: Goal: Ability to remain free from injury will improve Outcome: Progressing   Problem: Fluid Volume: Goal: Ability to maintain a balanced intake and output will improve Outcome: Progressing   Problem: Nutritional: Goal: Maintenance of adequate nutrition will improve Outcome: Progressing

## 2023-03-23 NOTE — TOC Progression Note (Signed)
Transition of Care [facility]) - Progression Note    Patient Details  Name: Ann [name] MRN: [id number] Date of Birth: 1946/01/30  Transition of Care [facility]) CM/SW Contact  Catalina Gravel, [place] Phone Number: 03/23/2023, 3:49 PM  Clinical Narrative:    [place] from MD- family would like options for possible  SNF placement if PT does not recommend expressed and interest in Assisted Living. Family concerned pt needs rehab/additional support. TOC to follow.      Barriers to Discharge: Continued Medical Work up  Expected Discharge Plan and Services                                               Social Determinants of Health (SDOH) Interventions SDOH Screenings   Food Insecurity: No Food Insecurity (03/21/2023)  Housing: Low Risk  (03/21/2023)  Transportation Needs: No Transportation Needs (03/21/2023)  Utilities: Not At Risk (03/21/2023)  Alcohol Screen: Low Risk  (10/22/2022)  Depression (PHQ2-9): Low Risk  (10/30/2022)  Recent Concern: Depression (PHQ2-9) - High Risk (10/16/2022)  Financial Resource Strain: Low Risk  (10/22/2022)  Physical Activity: Insufficiently Active (10/22/2022)  Social Connections: Moderately Integrated (10/22/2022)  Stress: No Stress Concern Present (10/22/2022)  Tobacco Use: Medium Risk (03/21/2023)    Readmission Risk Interventions    03/22/2023    1:49 PM 03/17/2023    5:19 PM 12/04/2022   10:26 AM  Readmission Risk Prevention Plan  Transportation Screening  Complete Complete  PCP or Specialist Appt within 3-5 Days Complete    HRI or Home Care Consult Complete Complete Complete  Social Work Consult for Recovery Care Planning/Counseling Complete Complete Complete  Palliative Care Screening Complete Not Applicable Not Applicable  Medication Review Oceanographer) Complete Complete Complete

## 2023-03-24 DIAGNOSIS — I482 Chronic atrial fibrillation, unspecified: Secondary | ICD-10-CM | POA: Diagnosis not present

## 2023-03-24 DIAGNOSIS — J441 Chronic obstructive pulmonary disease with (acute) exacerbation: Secondary | ICD-10-CM | POA: Diagnosis not present

## 2023-03-24 DIAGNOSIS — E1165 Type 2 diabetes mellitus with hyperglycemia: Secondary | ICD-10-CM

## 2023-03-24 DIAGNOSIS — I10 Essential (primary) hypertension: Secondary | ICD-10-CM

## 2023-03-24 DIAGNOSIS — N1831 Chronic kidney disease, stage 3a: Secondary | ICD-10-CM

## 2023-03-24 DIAGNOSIS — E039 Hypothyroidism, unspecified: Secondary | ICD-10-CM

## 2023-03-24 DIAGNOSIS — Z794 Long term (current) use of insulin: Secondary | ICD-10-CM

## 2023-03-24 DIAGNOSIS — Z2239 Carrier of other specified bacterial diseases: Secondary | ICD-10-CM

## 2023-03-24 LAB — BASIC METABOLIC PANEL
Anion gap: 11 (ref 5–15)
BUN: 28 mg/dL — ABNORMAL HIGH (ref 8–23)
CO2: 29 mmol/L (ref 22–32)
Calcium: 8.6 mg/dL — ABNORMAL LOW (ref 8.9–10.3)
Chloride: 94 mmol/L — ABNORMAL LOW (ref 98–111)
Creatinine, Ser: 0.72 mg/dL (ref 0.44–1.00)
GFR, Estimated: >60 mL/min (ref >60)
Glucose, Bld: 200 mg/dL — ABNORMAL HIGH (ref 70–99)
Potassium: 4.1 mmol/L (ref 3.5–5.1)
Sodium: 134 mmol/L — ABNORMAL LOW (ref 135–145)

## 2023-03-24 LAB — GLUCOSE, CAPILLARY
Glucose-Capillary: 172 mg/dL — ABNORMAL HIGH (ref 70–99)
Glucose-Capillary: 150 mg/dL — ABNORMAL HIGH (ref 70–99)
Glucose-Capillary: 286 mg/dL — ABNORMAL HIGH (ref 70–99)

## 2023-03-24 MED ORDER — IPRATROPIUM-ALBUTEROL 0.5-2.5 (3) MG/3ML IN SOLN
3.0000 mL | Freq: Three times a day (TID) | RESPIRATORY_TRACT | Status: DC
  Administered 2023-03-24 – 2023-03-26 (×7): 3 mL via RESPIRATORY_TRACT
  Filled 2023-03-24 (×8): qty 3

## 2023-03-24 NOTE — TOC Progression Note (Signed)
Transition of Care [facility]) - Progression Note    Patient Details  Name: MARLINA KALANI MRN: [id number] Date of Birth: [birth year]  Transition of Care [facility]) CM/SW Contact  Karn Cassis, [place] Phone Number: 03/24/2023, 1:08 PM  Clinical Narrative: Bed offers provided. Pt accepts bed at [facility]. Facility notified. Will start authorization when appropriate.         Barriers to Discharge: Continued Medical Work up  Expected Discharge Plan and Services                                               Social Determinants of Health (SDOH) Interventions SDOH Screenings   Food Insecurity: No Food Insecurity (03/21/2023)  Housing: Low Risk  (03/21/2023)  Transportation Needs: No Transportation Needs (03/21/2023)  Utilities: Not At Risk (03/21/2023)  Alcohol Screen: Low Risk  (10/22/2022)  Depression (PHQ2-9): Low Risk  (10/30/2022)  Recent Concern: Depression (PHQ2-9) - High Risk (10/16/2022)  Financial Resource Strain: Low Risk  (10/22/2022)  Physical Activity: Insufficiently Active (10/22/2022)  Social Connections: Moderately Integrated (10/22/2022)  Stress: No Stress Concern Present (10/22/2022)  Tobacco Use: Medium Risk (03/21/2023)    Readmission Risk Interventions    03/22/2023    1:49 PM 03/17/2023    5:19 PM 12/04/2022   10:26 AM  Readmission Risk Prevention Plan  Transportation Screening  Complete Complete  PCP or Specialist Appt within 3-5 Days Complete    HRI or Home Care Consult Complete Complete Complete  Social Work Consult for Recovery Care Planning/Counseling Complete Complete Complete  Palliative Care Screening Complete Not Applicable Not Applicable  Medication Review Oceanographer) Complete Complete Complete

## 2023-03-24 NOTE — [place] FL2 (Signed)
[place] MEDICAID FL2 LEVEL OF CARE FORM     IDENTIFICATION  Patient Name: Ann [name] Birthdate: [birth year] Sex: female Admission Date (Current Location): 03/21/2023  [facility] and [place] Number:  [employer] and Address:  [facility],  [address] [place] [address]      Provider Number: [id number]  Attending Physician Name and Address:  Vassie Loll, MD  Relative Name and Phone Number:       Current Level of Care: Hospital Recommended Level of Care: Skilled Nursing Facility Prior Approval Number:    Date Approved/Denied:   PASRR Number: [id number]  Discharge Plan: SNF    Current Diagnoses: Patient Active Problem List   Diagnosis Date Noted   COPD exacerbation (HCC) 03/21/2023   Cellulitis 03/17/2023   Acute on chronic respiratory failure with hypoxia (HCC) 12/03/2022   Iron deficiency anemia due to chronic blood loss 10/29/2022   Elevated brain natriuretic peptide (BNP) level 09/17/2022   Chronic respiratory failure with hypoxia (HCC) 04/30/2022   History of colostomy reversal 12/18/2020   Abnormality of gait 07/04/2020   Colostomy in place [facility]) 06/13/2020   Colostomy, retracted (HCC) 06/13/2020   Labile blood glucose    Supplemental oxygen dependent    Steroid-induced hyperglycemia    Controlled type 2 diabetes mellitus with hyperglycemia, with long-term current use of insulin (HCC)    Hypoalbuminemia due to protein-calorie malnutrition (HCC)    Chronic diastolic congestive heart failure (HCC)    Acute blood loss anemia    Debility 05/25/2020   Sigmoid Diverticulitis with Perforation-- 05/15/2020   Diverticulitis of large intestine with perforation with bleeding    Sepsis Due to Sigmoid Diverticulitis with Perforation 05/14/2020   Acute GI bleeding 05/14/2020   Acute on chronic anemia-Due to Acute Blood Loss 05/14/2020   Acute respiratory failure with hypoxia (HCC) 01/24/2020   Atrial fibrillation with RVR (HCC)  01/23/2020   Anxiety    Uncontrolled type 2 diabetes mellitus with hyperglycemia, with long-term current use of insulin (HCC)    Weakness 12/10/2019   Acute pain of left knee 08/31/2019   History of DVT (deep vein thrombosis)    Coronary artery disease of native artery of native heart with stable angina pectoris (HCC)    COPD GOLD 0 / 02 dep / prob Cor pulmonale    Atrial fibrillation with rapid ventricular response (HCC) 07/13/2019   COPD with acute exacerbation (HCC) 04/04/2018   Comprehensive diabetic foot examination, type 2 DM, encounter for (HCC) 04/04/2018   PAF (paroxysmal atrial fibrillation) (HCC) 04/04/2018   Constipation by delayed colonic transit 10/23/2016   Fatty liver 12/11/2015   GERD without esophagitis 07/14/2015   Hypotension 03/15/2013   Elevated troponin 03/15/2013   VRE (vancomycin resistant enterococcus) culture positive 10/10/2012   Nonsustained ventricular tachycardia (HCC) 10/10/2012   Cellulitis of arm, right 10/06/2012   Hydronephrosis 09/15/2012   Abdominal mass, right lower quadrant 09/10/2012   Microcytic anemia 08/22/2012   Acute on chronic diastolic heart failure (HCC) 08/06/2012   UTI (urinary tract infection) 07/30/2012   Atrial fibrillation, chronic (HCC) 07/02/2012   Vitamin B12 deficiency 06/28/2012   Sinusitis chronic, frontal 06/28/2012   Morbid obesity with BMI of 50.0-59.9, adult (HCC) 06/28/2012   Chronic kidney disease, stage III (moderate) (HCC) 06/28/2012   ARF (acute renal failure) (HCC) 06/27/2012   Long term current use of anticoagulant therapy 06/27/2012   DOE (dyspnea on exertion) 06/27/2012   Adrenal insufficiency (HCC) 01/07/2012   Uncontrolled type 2 diabetes mellitus  with hyperglycemia (HCC) 04/19/2011   Chronic diastolic heart failure (HCC) 04/19/2011   GANGLION OF TENDON SHEATH 02/21/2010   Acquired hypothyroidism 12/01/2008   Mixed hyperlipidemia 12/01/2008   Essential hypertension 12/01/2008   Coronary atherosclerosis  12/01/2008   RENAL FAILURE, CHRONIC 12/01/2008    Orientation RESPIRATION BLADDER Height & Weight     Time, Self, Situation, Place  O2 (3L) Incontinent Weight: 269 lb 10 oz (122.3 kg) Height:  5\' 3"  (160 cm)  BEHAVIORAL SYMPTOMS/MOOD NEUROLOGICAL BOWEL NUTRITION STATUS      Continent Diet (See d/c summary for updates.)  AMBULATORY STATUS COMMUNICATION OF NEEDS Skin   Extensive Assist Verbally Bruising, Other (Comment) (Rash to buttocks)                       Personal Care Assistance Level of Assistance  Bathing, Feeding, Dressing Bathing Assistance: Maximum assistance Feeding assistance: Limited assistance Dressing Assistance: Maximum assistance     Functional Limitations Info  Hearing, Sight, Speech Sight Info: Impaired Hearing Info: Adequate Speech Info: Adequate    SPECIAL CARE FACTORS FREQUENCY  PT (By licensed PT), OT (By licensed OT)     PT Frequency: 5x weekly OT Frequency: 5xweekly            Contractures      Additional Factors Info  Code Status, Allergies Code Status Info: DNR- Limited Allergies Info: Ace Inhibitors, Asa (aspirin), Breztri Aerosphere (budeson-glycopyrrol-formoterol), Niacin, Reglan (metoclopramide)           Current Medications (03/24/2023):  This is the current hospital active medication list Current Facility-Administered Medications  Medication Dose Route Frequency Provider Last Rate Last Admin   acetaminophen (TYLENOL) tablet 650 mg  650 mg Oral Q6H PRN Elgergawy, Leana Roe, MD   650 mg at 03/24/23 0408   albuterol (PROVENTIL) (2.5 MG/3ML) 0.083% nebulizer solution 2.5 mg  2.5 mg Nebulization Q2H PRN Elgergawy, Leana Roe, MD   2.5 mg at 03/22/23 1147   amoxicillin (AMOXIL) capsule 1,000 mg  1,000 mg Oral Q8H Arrien, York Ram, MD   1,000 mg at 03/24/23 0557   apixaban (ELIQUIS) tablet 5 mg  5 mg Oral BID Elgergawy, Leana Roe, MD   5 mg at 03/24/23 0844   atorvastatin (LIPITOR) tablet 80 mg  80 mg Oral q1800 Elgergawy,  Leana Roe, MD   80 mg at 03/23/23 1651   bisoprolol (ZEBETA) tablet 10 mg  10 mg Oral Daily Elgergawy, Leana Roe, MD   10 mg at 03/24/23 0842   budesonide (PULMICORT) nebulizer solution 0.25 mg  0.25 mg Nebulization BID Elgergawy, Leana Roe, MD   0.25 mg at 03/24/23 0725   cyanocobalamin (VITAMIN B12) tablet 1,000 mcg  1,000 mcg Oral Daily Elgergawy, Leana Roe, MD   1,000 mcg at 03/24/23 0844   diltiazem (CARDIZEM CD) 24 hr capsule 120 mg  120 mg Oral Daily Elgergawy, Leana Roe, MD   120 mg at 03/24/23 0843   gabapentin (NEURONTIN) capsule 400 mg  400 mg Oral TID Elgergawy, Leana Roe, MD   400 mg at 03/24/23 0844   insulin aspart (novoLOG) injection 0-15 Units  0-15 Units Subcutaneous TID WC Arrien, York Ram, MD   3 Units at 03/24/23 0844   insulin aspart protamine- aspart (NOVOLOG MIX 70/30) injection 30 Units  30 Units Subcutaneous BID WC Elgergawy, Leana Roe, MD   30 Units at 03/24/23 0845   ipratropium-albuterol (DUONEB) 0.5-2.5 (3) MG/3ML nebulizer solution 3 mL  3 mL Nebulization TID Arrien, York Ram, MD  3 mL at 03/24/23 0725   levothyroxine (SYNTHROID) tablet 175 mcg  175 mcg Oral q morning Elgergawy, Leana Roe, MD   175 mcg at 03/24/23 0557   mirabegron ER (MYRBETRIQ) tablet 25 mg  25 mg Oral Daily Elgergawy, Leana Roe, MD   25 mg at 03/24/23 0844   nitroGLYCERIN (NITROSTAT) SL tablet 0.4 mg  0.4 mg Sublingual Q5 min PRN Elgergawy, Leana Roe, MD       pantoprazole (PROTONIX) EC tablet 40 mg  40 mg Oral Daily Elgergawy, Leana Roe, MD   40 mg at 03/24/23 0844   predniSONE (DELTASONE) tablet 40 mg  40 mg Oral Q breakfast Elgergawy, Leana Roe, MD   40 mg at 03/24/23 1610     Discharge Medications: Please see discharge summary for a list of discharge medications.  Relevant Imaging Results:  Relevant Lab Results:   Additional Information SSN: [id number]  Karn Cassis, LCSW

## 2023-03-24 NOTE — TOC Progression Note (Signed)
Transition of Care [facility]) - Progression Note    Patient Details  Name: Ann [name] MRN: [id number] Date of Birth: 02/08/1946  [facility] of [facility] CM/SW Contact  Karn Cassis, [place] Phone Number: 03/24/2023, 11:02 AM  Clinical Narrative: PT recommending SNF. LCSW discussed with pt and pt's husband at bedside. They are agreeable and request [facility] if possible. Reviewed Medicare.gov ratings. Will initiate bed search and start authorization when appropriate.         Barriers to Discharge: Continued Medical Work up  Expected Discharge Plan and Services                                               Social Determinants of Health (SDOH) Interventions SDOH Screenings   Food Insecurity: No Food Insecurity (03/21/2023)  Housing: Low Risk  (03/21/2023)  Transportation Needs: No Transportation Needs (03/21/2023)  Utilities: Not At Risk (03/21/2023)  Alcohol Screen: Low Risk  (10/22/2022)  Depression (PHQ2-9): Low Risk  (10/30/2022)  Recent Concern: Depression (PHQ2-9) - High Risk (10/16/2022)  Financial Resource Strain: Low Risk  (10/22/2022)  Physical Activity: Insufficiently Active (10/22/2022)  Social Connections: Moderately Integrated (10/22/2022)  Stress: No Stress Concern Present (10/22/2022)  Tobacco Use: Medium Risk (03/21/2023)    Readmission Risk Interventions    03/22/2023    1:49 PM 03/17/2023    5:19 PM 12/04/2022   10:26 AM  Readmission Risk Prevention Plan  Transportation Screening  Complete Complete  PCP or Specialist Appt within 3-5 Days Complete    HRI or Home Care Consult Complete Complete Complete  Social Work Consult for Recovery Care Planning/Counseling Complete Complete Complete  Palliative Care Screening Complete Not Applicable Not Applicable  Medication Review [employer]) Complete Complete Complete

## 2023-03-24 NOTE — Progress Notes (Signed)
Pt slept comfortably through the night. Awoke at 0330 requesting Tylenol for headache, gave prn tylenol and repositioned patient for comfort. Patient lungs sound clear, yet diminished. Discussed her sugar/carb intake with her and educated her not to avoid sugary sweets and fast food during her stay and after Discharge.

## 2023-03-24 NOTE — Plan of Care (Signed)
  Problem: Education: Goal: Knowledge of disease or condition will improve Outcome: Progressing Goal: Knowledge of the prescribed therapeutic regimen will improve Outcome: Progressing   Problem: Activity: Goal: Ability to tolerate increased activity will improve Outcome: Progressing Goal: Will verbalize the importance of balancing activity with adequate rest periods Outcome: Progressing   Problem: Respiratory: Goal: Ability to maintain a clear airway will improve Outcome: Progressing Goal: Ability to maintain adequate ventilation will improve Outcome: Progressing   Problem: Education: Goal: Knowledge of General Education information will improve Description: Including pain rating scale, medication(s)/side effects and non-pharmacologic comfort measures Outcome: Progressing   Problem: Clinical Measurements: Goal: Respiratory complications will improve Outcome: Progressing   Problem: Activity: Goal: Risk for activity intolerance will decrease Outcome: Progressing   Problem: Elimination: Goal: Will not experience complications related to urinary retention Outcome: Progressing   Problem: Pain Management: Goal: General experience of comfort will improve Outcome: Progressing   Problem: Safety: Goal: Ability to remain free from injury will improve Outcome: Progressing   Problem: Fluid Volume: Goal: Ability to maintain a balanced intake and output will improve Outcome: Progressing   Problem: Nutritional: Goal: Maintenance of adequate nutrition will improve Outcome: Progressing

## 2023-03-24 NOTE — Progress Notes (Signed)
Progress Note   Patient: Ann [name]:[id number] DOB: 04/09/1946 DOA: 03/21/2023     3 DOS: the patient was seen and examined on 03/24/2023   Brief hospital course: Ann [name] was admitted to the hospital with the working diagnosis of COPD exacerbation.    77 yo female with the past medical history of COPD, chronic hypoxia, diastolic heart failure, atrial fibrillation, hypertension, T2DM, and DVT who presented with dyspnea.  Recent hospitalization for COPD, and cellulitis with bacteremia. (10/20 to 03/19/23).  At home continue to have worsening dyspnea and wheezing. In the ED she required 6 L/min per [place], her blood pressure was 121/78, HR 89, RR 18 and 02 saturation 94%, lungs with bilateral wheezing and decreased ventilation, heart with S1 and S2 present and irregular, abdomen with no distention and no lower extremity edema.    Chest radiograph with cardiomegaly, with prominent pulmonary vasculature at the hilar region, opacity at the left lower lobe, possible effusion or atelectasis.   Assessment and Plan: COPD with acute exacerbation (HCC) -Overall condition continue improving -Patient reports of breathing easier and less short winded -Continue treatment with systemic steroids and bronchodilator therapy -Mucolytic's and the use of flutter valve/incentive spirometer has been prescribed -Continue oxygen supplementation and supportive care. -Follow clinical response.  Atrial fibrillation, chronic (HCC) -Continue treatment with bisoprolol and diltiazem -Continue apixaban for secondary prevention -Continue telemetry monitoring.  Chronic diastolic congestive heart failure (HCC) -Echocardiogram with preserved LV systolic function with EF 60 to 65%, mild LVH, RV systolic function preserved.  LA with moderate dilatation, trivial pericardial effusion.  -Continue to hold Lasix at the moment -Follow daily weights, strict I's and O's and low-sodium diet -Will benefit of low-dose  daily at discharge.  Chronic kidney disease, stage III (moderate) (HCC) -Renal function appears to be stable at baseline -Continue to follow trend and maintain adequate hydration.  Acquired hypothyroidism -Continue levothyroxine  -TSH within normal limits.  Essential hypertension -Overall stable -Continue current antihypertensive agent -Low-sodium diet has been discussed with patient.  Controlled type 2 diabetes mellitus with hyperglycemia, with long-term current use of insulin (HCC) -Continue insulin 70/30  -Sliding scale for glucose cover and monitoring (especially while using steroids) -Continue to follow CBG fluctuation and further adjust hypoglycemic regimen as required.  VRE (vancomycin resistant enterococcus) culture positive -Enterococcal bacteremia, resume regimen with oral amoxicillin.   Obesity class 3 -Low-calorie diet and portion control discussed with patient -Body mass index is 47.76 kg/m.  Physical deconditioning -Physical therapy has evaluated patient with recommendation to skilled nursing facility at discharge -[facility] aware and helping with placement. -Anticipated medical stability in the next 24-48 hours.   Subjective:  Patient continues slowly improving and breathing easier; no chest pain, no fever, no nausea or vomiting.  No overnight events.  Chronically ill in appearance and generally weak.  Physical Exam: Vitals:   03/24/23 0600 03/24/23 0726 03/24/23 1310 03/24/23 1337  BP: (!) 151/90   (!) 149/88  Pulse:    82  Resp: 20   19  Temp: 98.3 F (36.8 C)   98.1 F (36.7 C)  TempSrc: Oral     SpO2: 97% 97% 97% 96%  Weight: 122.3 kg     Height:       General exam: Alert, awake, oriented x 3; morbidly obese and generally weak.  In no acute distress.  Reports breathing is easier. Respiratory system: Fair air movement bilaterally; mild expiratory wheezing.  No using accessory muscle.  Good saturation on chronic supplementation. Cardiovascular system:  Rate controlled, no rubs, no gallops, unable to assess JVD with body habitus. Gastrointestinal system: Abdomen is obese, nondistended, soft and nontender.  Positive bowel sounds. Central nervous system: Moving 4 limbs spontaneously.  No focal neurological deficits. Extremities/skin: No cyanosis or clubbing; 2+ edema appreciated bilaterally chronically and unchanged; bilaterally stasis dermatitis and lymphedema changes without purulent discharge or signs of superimposed infection at the moment. Psychiatry: Judgement and insight appear normal. Mood & affect appropriate.   Family Communication: No family at bedside on today's examination.   Disposition: Status is: Inpatient Remains inpatient appropriate because: recovering copd exacerbation   Planned Discharge Destination: Skilled nursing Home   Author: Vassie Loll, MD 03/24/2023 5:07 PM  For on call review [email or web address].

## 2023-03-24 NOTE — Evaluation (Signed)
Physical Therapy Evaluation Patient Details Name: Ann [name] MRN: [id number] DOB: [birth year] Today's Date: 03/24/2023  History of Present Illness  Ann [name]  is a 77 y.o. female, past medical history of COPD, chronic hypoxic respiratory failure on 2 L oxygen, CAD, diastolic CHF, status post prior stents, A-fib, on Eliquis, hypertension, GERD, chronic anticoagulation, history of DVT in the past, diabetes mellitus type 2, insulin-dependent, patient with recent hospitalization secondary to cellulitis, COPD exacerbation, in the ED for [place] bacteremia discharged on amoxicillin.  -Patient presents to ED secondary to shortness of breath, patient reports respiratory status now back to baseline since discharge, reports worsening dyspnea, she does report some worsening lower extremity edema as well,  -In ED patient with dyspnea, significant wheezing, requiring up to 6 L oxygen via nasal cannula up from 2 L at baseline, BNP as well significantly elevated, evidence of volume overload and atelectasis, [facility] hospitalist consulted to admit.   Clinical Impression  Patient demonstrates slow labored movement for sitting up at bedside requiring use of bed rail, unsteady on feet and limited to a few side steps before having to sit due to fatigue, weakness and fall risk.  Patient tolerated sitting up in chair after therapy.  Patient will benefit from continued skilled physical therapy in hospital and recommended venue below to increase strength, balance, endurance for safe ADLs and gait.           If plan is discharge home, recommend the following: A little help with walking and/or transfers;A little help with bathing/dressing/bathroom;Assistance with cooking/housework;Help with stairs or ramp for entrance   Can travel by private vehicle   Yes    Equipment Recommendations None recommended by PT  Recommendations for Other Services       Functional Status Assessment Patient has had a recent decline in  their functional status and demonstrates the ability to make significant improvements in function in a reasonable and predictable amount of time.     Precautions / Restrictions Precautions Precautions: Fall Restrictions Weight Bearing Restrictions: No      Mobility  Bed Mobility Overal bed mobility: Needs Assistance Bed Mobility: Supine to Sit     Supine to sit: Supervision, Contact guard     General bed mobility comments: increased time, labored movement    Transfers Overall transfer level: Needs assistance Equipment used: Rolling walker (2 wheels) Transfers: Sit to/from Stand, Bed to chair/wheelchair/BSC Sit to Stand: Contact guard assist, Min assist   Step pivot transfers: Contact guard assist       General transfer comment: unsteady labored movement    Ambulation/Gait Ambulation/Gait assistance: Min assist, Mod assist Gait Distance (Feet): 4 Feet Assistive device: Rolling walker (2 wheels) Gait Pattern/deviations: Decreased step length - right, Decreased step length - left, Decreased stride length Gait velocity: slow     General Gait Details: limited to a few slow labored side steps due to fatigue and c/o generalized weakness  Stairs            Wheelchair Mobility     Tilt Bed    Modified Rankin (Stroke Patients Only)       Balance Overall balance assessment: Needs assistance Sitting-balance support: Feet supported, No upper extremity supported Sitting balance-Leahy Scale: Good Sitting balance - Comments: seated at EOB   Standing balance support: Reliant on assistive device for balance, During functional activity, Bilateral upper extremity supported Standing balance-Leahy Scale: Fair Standing balance comment: using RW  Pertinent Vitals/Pain Pain Assessment Pain Assessment: 0-10 Pain Score: 4  Pain Location: stomcach, neck Pain Descriptors / Indicators: Dull Pain Intervention(s): Limited activity  within patient's tolerance, Monitored during session, Repositioned    Home Living Family/patient expects to be discharged to:: Private residence Living Arrangements: Spouse/significant other Available Help at Discharge: Family;Available 24 hours/day Type of Home: House Home Access: Ramped entrance     Alternate Level Stairs-Number of Steps: flight Home Layout: Two level;Able to live on main level with bedroom/bathroom;Full bath on main level Home Equipment: Rollator (4 wheels);BSC/3in1;Grab bars - tub/shower;Wheelchair - manual;Hospital bed      Prior Function Prior Level of Function : Needs assist       Physical Assist : Mobility (physical);ADLs (physical) Mobility (physical): Bed mobility;Transfers;Gait;Stairs ADLs (physical): Bathing;Dressing;Toileting;IADLs;Grooming Mobility Comments: household ambulator using RW ADLs Comments: Assist for lower body bathing, dressing, and toielting. Recent assist for grooming due to B UE weakness.     Extremity/Trunk Assessment   Upper Extremity Assessment Upper Extremity Assessment: Defer to OT evaluation    Lower Extremity Assessment Lower Extremity Assessment: Generalized weakness    Cervical / Trunk Assessment Cervical / Trunk Assessment: Normal  Communication   Communication Communication: No apparent difficulties  Cognition Arousal: Alert Behavior During Therapy: WFL for tasks assessed/performed Overall Cognitive Status: Within Functional Limits for tasks assessed                                          General Comments      Exercises     Assessment/Plan    PT Assessment Patient needs continued PT services  PT Problem List Decreased strength;Decreased activity tolerance;Decreased balance;Decreased mobility       PT Treatment Interventions DME instruction;Gait training;Stair training;Functional mobility training;Therapeutic activities;Therapeutic exercise;Balance training;Patient/family education     PT Goals (Current goals can be found in the Care Plan section)  Acute Rehab PT Goals Patient Stated Goal: return home after rehab PT Goal Formulation: With patient Time For Goal Achievement: 04/07/23 Potential to Achieve Goals: Good    Frequency Min 3X/week     Co-evaluation PT/OT/SLP Co-Evaluation/Treatment: Yes Reason for Co-Treatment: To address functional/ADL transfers PT goals addressed during session: Mobility/safety with mobility;Balance;Proper use of DME OT goals addressed during session: ADL's and self-care       AM-PAC PT "6 Clicks" Mobility  Outcome Measure Help needed turning from your back to your side while in a flat bed without using bedrails?: A Little Help needed moving from lying on your back to sitting on the side of a flat bed without using bedrails?: A Little Help needed moving to and from a bed to a chair (including a wheelchair)?: A Little Help needed standing up from a chair using your arms (e.g., wheelchair or bedside chair)?: A Little Help needed to walk in hospital room?: A Lot Help needed climbing 3-5 steps with a railing? : A Lot 6 Click Score: 16    End of Session Equipment Utilized During Treatment: Oxygen Activity Tolerance: Patient tolerated treatment well;Patient limited by fatigue Patient left: in chair;with call bell/phone within reach Nurse Communication: Mobility status PT Visit Diagnosis: Unsteadiness on feet (R26.81);Other abnormalities of gait and mobility (R26.89);Muscle weakness (generalized) (M62.81)    Time: 6962-9528 PT Time Calculation (min) (ACUTE ONLY): 20 min   Charges:   PT Evaluation $PT Eval Moderate Complexity: 1 Mod PT Treatments $Therapeutic Activity: 23-37 mins PT General Charges $$  ACUTE PT VISIT: 1 Visit         11:55 AM, 03/24/23 Ocie Bob, MPT Physical Therapist with [facility] [phone] office [phone] mobile phone

## 2023-03-24 NOTE — Plan of Care (Signed)
  Problem: Acute Rehab OT Goals (only OT should resolve) Goal: Pt. Will Perform Grooming Flowsheets (Taken 03/24/2023 0945) Pt Will Perform Grooming:  with modified independence  sitting Goal: Pt. Will Perform Upper Body Bathing Flowsheets (Taken 03/24/2023 0945) Pt Will Perform Upper Body Bathing:  with modified independence  sitting Goal: Pt. Will Perform Upper Body Dressing Flowsheets (Taken 03/24/2023 0945) Pt Will Perform Upper Body Dressing:  with modified independence  sitting Goal: Pt. Will Transfer To Toilet Flowsheets (Taken 03/24/2023 0945) Pt Will Transfer to Toilet:  with modified independence  stand pivot transfer Goal: Pt/Caregiver Will Perform Home Exercise Program Flowsheets (Taken 03/24/2023 0945) Pt/caregiver will Perform Home Exercise Program:  Increased ROM  Increased strength  Both right and left upper extremity  Independently  Cristin Penaflor OT, MOT

## 2023-03-24 NOTE — Inpatient Diabetes Management (Signed)
Inpatient Diabetes Program Recommendations  AACE/ADA: New Consensus Statement on Inpatient Glycemic Control   Target Ranges:  Prepandial:   less than 140 mg/dL      Peak postprandial:   less than 180 mg/dL (1-2 hours)      Critically ill patients:  140 - 180 mg/dL    Latest Reference Range & Units 03/23/23 07:45 03/23/23 11:18 03/23/23 16:32 03/23/23 21:19 03/24/23 07:36  Glucose-Capillary 70 - 99 mg/dL 623 (H) 762 (H) 831 (H) 276 (H) 172 (H)   Review of Glycemic Control  Diabetes history: DM2 Outpatient Diabetes medications: Glipizide XL 5 mg QAM, Humalog 75/25 40-50 units BID Current orders for Inpatient glycemic control: Novolog 70/30 30 units BID, Novolog 0-15 units TID with meals; Prednisone 40 mg QAM  Inpatient Diabetes Program Recommendations:    Insulin: If steroids are continued, please consider increasing 70/30 to 35 units BID.  Thanks, Orlando Penner, RN, MSN, CDCES Diabetes Coordinator Inpatient Diabetes Program [phone] (Team Pager from 8am to 5pm)

## 2023-03-24 NOTE — Evaluation (Signed)
Occupational Therapy Evaluation Patient Details Name: Ann [name] MRN: [id number] DOB: 02/05/1946 Today's Date: 03/24/2023   History of Present Illness Ann [name]  is a 77 y.o. female, past medical history of COPD, chronic hypoxic respiratory failure on 2 L oxygen, CAD, diastolic CHF, status post prior stents, A-fib, on Eliquis, hypertension, GERD, chronic anticoagulation, history of DVT in the past, diabetes mellitus type 2, insulin-dependent, patient with recent hospitalization secondary to cellulitis, COPD exacerbation, in the ED for Callis bacteremia discharged on amoxicillin.  -Patient presents to ED secondary to shortness of breath, patient reports respiratory status now back to baseline since discharge, reports worsening dyspnea, she does report some worsening lower extremity edema as well,  -In ED patient with dyspnea, significant wheezing, requiring up to 6 L oxygen via nasal cannula up from 2 L at baseline, BNP as well significantly elevated, evidence of volume overload and atelectasis, [facility] hospitalist consulted to admit. (per MD)   Clinical Impression   Pt agreeable to OT and PT co-evaluation. Pt reports assist for lower body ADL's at baseline. Pt reports being weaker than baseline. Today pt required CGA to min A for transfer to chair with RW. Mild A/ROM deficits in B shoulders and general weakness. Reports recent assist needed for grooming due to B UE weakness. Pt was left in the chair with call bell within reach. Pt will benefit from continued OT in the hospital and recommended venue below to increase strength, balance, and endurance for safe ADL's.         If plan is discharge home, recommend the following: A little help with walking and/or transfers;A lot of help with bathing/dressing/bathroom;Assistance with cooking/housework;Assist for transportation;Help with stairs or ramp for entrance    Functional Status Assessment  Patient has had a recent decline in their  functional status and demonstrates the ability to make significant improvements in function in a reasonable and predictable amount of time.  Equipment Recommendations  None recommended by OT           Precautions / Restrictions Precautions Precautions: Fall Restrictions Weight Bearing Restrictions: No      Mobility Bed Mobility Overal bed mobility: Needs Assistance Bed Mobility: Supine to Sit     Supine to sit: Supervision, Contact guard     General bed mobility comments: increased time, labored movement    Transfers Overall transfer level: Needs assistance Equipment used: Rolling walker (2 wheels) Transfers: Sit to/from Stand, Bed to chair/wheelchair/BSC Sit to Stand: Contact guard assist, Min assist     Step pivot transfers: Contact guard assist     General transfer comment: slow labored movement; slight assist when boosting from EOB. use of RW      Balance Overall balance assessment: Needs assistance Sitting-balance support: Feet supported, No upper extremity supported Sitting balance-Leahy Scale: Good Sitting balance - Comments: seated at EOB   Standing balance support: Reliant on assistive device for balance, During functional activity, Bilateral upper extremity supported Standing balance-Leahy Scale: Fair Standing balance comment: using RW                           ADL either performed or assessed with clinical judgement   ADL Overall ADL's : Needs assistance/impaired     Grooming: Moderate assistance;Sitting;Minimal assistance   Upper Body Bathing: Moderate assistance;Sitting;Minimal assistance   Lower Body Bathing: Maximal assistance;Sitting/lateral leans   Upper Body Dressing : Minimal assistance;Moderate assistance   Lower Body Dressing: Maximal assistance;Sitting/lateral leans   Toilet Transfer:  Contact guard assist;Minimal assistance;Stand-pivot;Rolling walker (2 wheels) Toilet Transfer Details (indicate cue type and reason):  Simulated via EOB to chair transfer. 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.) Patient Visual Report: No change from baseline Vision Assessment?:  (Deficits at baseline.)     Perception Perception: Not tested       Praxis Praxis: Not tested       Pertinent Vitals/Pain Pain Assessment Pain Assessment: 0-10 Pain Score: 4  Pain Descriptors / Indicators: Dull Pain Intervention(s): Limited activity within patient's tolerance, Monitored during session, Repositioned     Extremity/Trunk Assessment Upper Extremity Assessment Upper Extremity Assessment: Generalized weakness   Lower Extremity Assessment Lower Extremity Assessment: Defer to PT evaluation   Cervical / Trunk Assessment Cervical / Trunk Assessment: Normal   Communication Communication Communication: No apparent difficulties   Cognition Arousal: Alert Behavior During Therapy: WFL for tasks assessed/performed Overall Cognitive Status: Within Functional Limits for tasks assessed                                                        Home Living Family/patient expects to be discharged to:: Private residence Living Arrangements: Spouse/significant other Available Help at Discharge: Family;Available 24 hours/day Type of Home: House Home Access: Ramped entrance     Home Layout: Two level;Able to live on main level with bedroom/bathroom;Full bath on main level Alternate Level Stairs-Number of Steps: flight Alternate Level Stairs-Rails: Right Bathroom Shower/Tub: Producer, television/film/video: Handicapped height Bathroom Accessibility: Yes   Home Equipment: Rollator (4 wheels);BSC/3in1;Grab bars - tub/shower;Wheelchair - manual;Hospital bed           Prior Functioning/Environment Prior Level of Function : Needs assist       Physical Assist : Mobility (physical);ADLs (physical)   ADLs (physical): Bathing;Dressing;Toileting;IADLs;Grooming Mobility Comments: household ambulator using RW ADLs Comments: Assist for lower body bathing, dressing, and toielting. Recent assist for grooming due to B UE weakness.        OT Problem List: Decreased strength;Decreased range of motion;Decreased activity tolerance;Impaired balance (sitting and/or standing)      OT Treatment/Interventions: Self-care/ADL training;Therapeutic exercise;Therapeutic activities;Patient/family education;Balance training    OT Goals(Current goals can be found in the care plan section) Acute Rehab OT Goals Patient Stated Goal: Improve function prior to return home. OT Goal Formulation: With patient Time For Goal Achievement: 04/07/23 Potential to Achieve Goals: Good  OT Frequency: Min 2X/week    Co-evaluation PT/OT/SLP Co-Evaluation/Treatment: Yes Reason for Co-Treatment: To address functional/ADL transfers   OT goals addressed during session: ADL's and self-care                       End of Session Equipment Utilized During Treatment: Rolling walker (2 wheels);Gait belt;Oxygen (3 LPM)  Activity Tolerance: Patient tolerated treatment well Patient left: in chair;with call bell/phone within reach  OT Visit Diagnosis: Unsteadiness on feet (R26.81);Other abnormalities of gait and mobility (R26.89);Muscle weakness (generalized) (M62.81)                Time: 5638-7564 OT Time Calculation (min): 12 min Charges:  OT General Charges $  OT Visit: 1 Visit OT Evaluation $OT Eval Low Complexity: 1 Low  Santhiago Collingsworth OT, MOT   Danie Chandler 03/24/2023, 9:42 AM

## 2023-03-24 NOTE — Plan of Care (Signed)
  Problem: Acute Rehab PT Goals(only PT should resolve) Goal: Pt Will Go Supine/Side To Sit Outcome: Progressing Flowsheets (Taken 03/24/2023 1156) Pt will go Supine/Side to Sit:  with supervision  with contact guard assist Goal: Patient Will Transfer Sit To/From Stand Outcome: Progressing Flowsheets (Taken 03/24/2023 1156) Patient will transfer sit to/from stand:  with supervision  with contact guard assist Goal: Pt Will Transfer Bed To Chair/Chair To Bed Outcome: Progressing Flowsheets (Taken 03/24/2023 1156) Pt will Transfer Bed to Chair/Chair to Bed: with contact guard assist Goal: Pt Will Ambulate Outcome: Progressing Flowsheets (Taken 03/24/2023 1156) Pt will Ambulate:  25 feet  with minimal assist  with contact guard assist  with rolling walker   11:57 AM, 03/24/23 Ocie Bob, MPT Physical Therapist with [facility] [phone] office [phone] mobile phone

## 2023-03-24 NOTE — Plan of Care (Signed)
  Problem: Clinical Measurements: Goal: Respiratory complications will improve Outcome: Progressing   Problem: Activity: Goal: Risk for activity intolerance will decrease Outcome: Progressing   Problem: Elimination: Goal: Will not experience complications related to bowel motility Outcome: Progressing Goal: Will not experience complications related to urinary retention Outcome: Progressing   Problem: Pain Management: Goal: General experience of comfort will improve Outcome: Progressing   Problem: Safety: Goal: Ability to remain free from injury will improve Outcome: Progressing   Problem: Skin Integrity: Goal: Risk for impaired skin integrity will decrease Outcome: Progressing

## 2023-03-25 ENCOUNTER — Ambulatory Visit: Payer: Medicare HMO | Admitting: Nurse Practitioner

## 2023-03-25 DIAGNOSIS — J441 Chronic obstructive pulmonary disease with (acute) exacerbation: Secondary | ICD-10-CM | POA: Diagnosis not present

## 2023-03-25 DIAGNOSIS — E1165 Type 2 diabetes mellitus with hyperglycemia: Secondary | ICD-10-CM | POA: Diagnosis not present

## 2023-03-25 DIAGNOSIS — I482 Chronic atrial fibrillation, unspecified: Secondary | ICD-10-CM | POA: Diagnosis not present

## 2023-03-25 DIAGNOSIS — I10 Essential (primary) hypertension: Secondary | ICD-10-CM | POA: Diagnosis not present

## 2023-03-25 LAB — GLUCOSE, CAPILLARY
Glucose-Capillary: 297 mg/dL — ABNORMAL HIGH (ref 70–99)
Glucose-Capillary: 189 mg/dL — ABNORMAL HIGH (ref 70–99)
Glucose-Capillary: 88 mg/dL (ref 70–99)
Glucose-Capillary: 199 mg/dL — ABNORMAL HIGH (ref 70–99)
Glucose-Capillary: 254 mg/dL — ABNORMAL HIGH (ref 70–99)

## 2023-03-25 MED ORDER — NYSTATIN 100000 UNIT/GM EX POWD
Freq: Three times a day (TID) | CUTANEOUS | Status: DC
  Filled 2023-03-25 (×2): qty 15

## 2023-03-25 NOTE — Progress Notes (Signed)
Progress Note   Patient: Ann [name] OZH:[id number] DOB: [birth year] DOA: 03/21/2023     4 DOS: the patient was seen and examined on 03/25/2023   Brief hospital course: Ann [name] was admitted to the hospital with the working diagnosis of COPD exacerbation.    77 yo female with the past medical history of COPD, chronic hypoxia, diastolic heart failure, atrial fibrillation, hypertension, T2DM, and DVT who presented with dyspnea.  Recent hospitalization for COPD, and cellulitis with bacteremia. (10/20 to 03/19/23).  At home continue to have worsening dyspnea and wheezing. In the ED she required 6 L/min per [place], her blood pressure was 121/78, HR 89, RR 18 and 02 saturation 94%, lungs with bilateral wheezing and decreased ventilation, heart with S1 and S2 present and irregular, abdomen with no distention and no lower extremity edema.    Chest radiograph with cardiomegaly, with prominent pulmonary vasculature at the hilar region, opacity at the left lower lobe, possible effusion or atelectasis.   Assessment and Plan: COPD with acute exacerbation (HCC) -Overall condition continue improving -Patient reports of breathing easier and less short winded -Continue treatment with systemic steroids and bronchodilator therapy -Mucolytic's and the use of flutter valve/incentive spirometer has been prescribed -Continue oxygen supplementation and supportive care. -Follow clinical response.  Atrial fibrillation, chronic (HCC) -Continue treatment with bisoprolol and diltiazem -Continue apixaban for secondary prevention -Continue telemetry monitoring.  Chronic diastolic congestive heart failure (HCC) -Echocardiogram with preserved LV systolic function with EF 60 to 65%, mild LVH, RV systolic function preserved.  LA with moderate dilatation, trivial pericardial effusion.  -Continue to hold Lasix at the moment -Follow daily weights, strict I's and O's and low-sodium diet -Will benefit of low-dose  daily at discharge.  Chronic kidney disease, stage III (moderate) (HCC) -Renal function appears to be stable at baseline -Continue to follow trend and maintain adequate hydration.  Acquired hypothyroidism -Continue levothyroxine  -TSH within normal limits.  Essential hypertension -Overall stable -Continue current antihypertensive agent -Low-sodium diet has been discussed with patient.  Controlled type 2 diabetes mellitus with hyperglycemia, with long-term current use of insulin (HCC) -Continue insulin 70/30  -Sliding scale for glucose cover and monitoring (especially while using steroids) -Continue to follow CBG fluctuation and further adjust hypoglycemic regimen as required.  VRE (vancomycin resistant enterococcus) culture positive -Enterococcal bacteremia, resume regimen with oral amoxicillin.   Obesity class 3 -Low-calorie diet and portion control discussed with patient -Body mass index is 47.76 kg/m.  Physical deconditioning -Physical therapy has evaluated patient with recommendation to skilled nursing facility at discharge -[facility] aware and helping with placement; insurance authorization currently pending. -Anticipated medical stability in the next 24 hours.  Yeast dermatitis -Continue nystatin powder.   Subjective:  No chest pain, no nausea, no vomiting, no fever.  No overnight events or complaints reported.  Physical Exam: Vitals:   03/25/23 0705 03/25/23 0828 03/25/23 1345 03/25/23 1415  BP:  (!) 144/93  131/72  Pulse:    83  Resp:    (!) 24  Temp:      TempSrc:      SpO2: 98%  98% 97%  Weight:      Height:       General exam: Alert, awake, oriented x 3; in no acute distress.  Chronically ill and deconditioned. Respiratory system: Air movement improved; no using accessory muscle.  Good saturation on chronic supplementation.  Mild expiratory wheezing and positive rhonchi at appreciated on exam. Cardiovascular system: Rate controlled, no rubs, no gallops,  unable to assess  JVD with body habitus. Gastrointestinal system: Abdomen is obese, nondistended, soft and nontender. No organomegaly or masses felt. Normal bowel sounds heard. Central nervous system:  No focal neurological deficits. Extremities: No cyanosis or clubbing; 2+ edema bilaterally (chronic and unchanged). Skin: No petechiae.  Positive venous stasis dermatitis and rash suggesting fungal dermatitis component in her buttocks and back of her thighs.  No open wounds no purulent discharge. Psychiatry: Flat affect.   Family Communication: No family at bedside on today's examination.   Disposition: Status is: Inpatient Remains inpatient appropriate because: recovering copd exacerbation   Planned Discharge Destination: Skilled nursing Home r: Vassie Loll, MD 03/25/2023 6:36 PM  For on call review [email or web address].

## 2023-03-25 NOTE — Plan of Care (Signed)
Pt progressing, was able to ambulate left to right in the bed. Pt denies pain, will continue to monitor throughout nursing shift.

## 2023-03-26 DIAGNOSIS — J441 Chronic obstructive pulmonary disease with (acute) exacerbation: Secondary | ICD-10-CM | POA: Diagnosis not present

## 2023-03-26 MED ORDER — AMOXICILLIN 500 MG PO CAPS: 1000.0000 mg | ORAL_CAPSULE | Freq: Three times a day (TID) | ORAL | 0 refills | Status: AC

## 2023-03-26 MED ORDER — BISOPROLOL FUMARATE 10 MG PO TABS: 10.0000 mg | ORAL_TABLET | Freq: Every day | ORAL | 0 refills | Status: DC

## 2023-03-26 MED ORDER — IPRATROPIUM-ALBUTEROL 0.5-2.5 (3) MG/3ML IN SOLN: 3.0000 mL | Freq: Two times a day (BID) | RESPIRATORY_TRACT | Status: DC

## 2023-03-26 NOTE — Consult Note (Signed)
[facility] Liaison Note  03/26/2023  [name] 1945/12/14 [id number]  Location: screened the patient remotely at [facility] ED.  Insurance: [employer]   Ann [name] is a 77 y.o. female who is a Primary Care Patient of [place], Wayland Salinas, MD The patient was screened for 7 and 30 day readmission hospitalization with noted high risk score for unplanned readmission risk with 4 IPin 6 months.  The patient was assessed for potential Care Management service needs for post hospital transition for care coordination. Review of patient's electronic medical record reveals patient was admitted with COPD. Pt will be discharge to SNF [facility]. Liaison will alert PAC-RN of pt's discharge disposition.   [facility] Care Management/Population Health does not replace or interfere with any arrangements made by the Inpatient Transition of Care team.   For questions contact:   Elliot Cousin, RN, [facility] Liaison [place]   [facility], Population Health Office Hours MTWF  8:00 am-6:00 pm Direct Dial: [phone] mobile [phone] [Office toll free line] Office Hours are M-F 8:30 - 5 pm [email or web address]

## 2023-03-26 NOTE — Care Management Important Message (Signed)
Important Message  Patient Details  Name: Ann [name] MRN: [id number] Date of Birth: [birth year]   Important Message Given:  Yes - Medicare IM     Corey Harold 03/26/2023, 9:23 AM

## 2023-03-26 NOTE — Discharge Summary (Signed)
Physician Discharge Summary  LIDDY SEITZ BTD:[id number] DOB: April 02, 1946 DOA: 03/21/2023  PCP: Benetta Spar, MD  Admit date: 03/21/2023  Discharge date: 03/26/2023  Admitted From:Home  Disposition:  SNF  Recommendations for Outpatient Follow-up:  Follow up with PCP in 1-2 weeks Continue on medications as noted below Use breathing treatments as needed for shortness of breath or wheezing No further need for prednisone Finish course of amoxicillin through 11/3 for VRE bacteremia  Home Health: None  Equipment/Devices: Nasal cannula oxygen  Discharge Condition:Stable  CODE STATUS: DNR  Diet recommendation: Heart Healthy/carb modified  Brief/Interim Summary: Mrs. Martinie was admitted to the hospital with the working diagnosis of COPD exacerbation.    77 yo female with the past medical history of COPD, chronic hypoxia, diastolic heart failure, atrial fibrillation, hypertension, T2DM, and DVT who presented with dyspnea.  She was treated with IV steroids which was transitioned to oral prednisone over the course of 5 days and also received multiple breathing treatments.  Her condition has improved back to baseline and she has been seen by PT with recommendations for SNF on discharge.  She is now in stable condition for discharge to rehab facility with no other acute events noted throughout the course of this admission.  Discharge Diagnoses:  Active Problems:   COPD with acute exacerbation (HCC)   Atrial fibrillation, chronic (HCC)   Chronic diastolic congestive heart failure (HCC)   Chronic kidney disease, stage III (moderate) (HCC)   Acquired hypothyroidism   Essential hypertension   Controlled type 2 diabetes mellitus with hyperglycemia, with long-term current use of insulin (HCC)   VRE (vancomycin resistant enterococcus) culture positive  Principal discharge diagnosis: Acute COPD exacerbation.  Discharge Instructions  Discharge Instructions     Diet - low  sodium heart healthy   Complete by: As directed    Increase activity slowly   Complete by: As directed       Allergies as of 03/26/2023       Reactions   Ace Inhibitors Other (See Comments)   Reaction unknown   Asa [aspirin] Other (See Comments)   Causes bleeding   Breztri Aerosphere [budeson-glycopyrrol-formoterol]    Tape Other (See Comments)   Skin tearing, causes scars Other Reaction(s): Other (See Comments)   Niacin Rash   Reglan [metoclopramide] Anxiety        Medication List     STOP taking these medications    predniSONE 10 MG tablet Commonly known as: DELTASONE       TAKE these medications    acetaminophen 325 MG tablet Commonly known as: TYLENOL Take 2 tablets (650 mg total) by mouth every 6 (six) hours as needed for mild pain, moderate pain or fever.   albuterol 108 (90 Base) MCG/ACT inhaler Commonly known as: VENTOLIN HFA Inhale 2 puffs into the lungs every 4 (four) hours as needed for wheezing.   amoxicillin 500 MG capsule Commonly known as: AMOXIL Take 2 capsules (1,000 mg total) by mouth every 8 (eight) hours for 4 days.   apixaban 5 MG Tabs tablet Commonly known as: ELIQUIS Take 1 tablet (5 mg total) by mouth 2 (two) times daily.   atorvastatin 80 MG tablet Commonly known as: LIPITOR Take 1 tablet (80 mg total) by mouth daily at 6 PM.   augmented betamethasone dipropionate 0.05 % cream Commonly known as: DIPROLENE-AF Apply 1 Application topically daily.   bisoprolol 10 MG tablet Commonly known as: ZEBETA Take 1 tablet (10 mg total) by mouth daily. Start taking on:  March 27, 2023 What changed:  medication strength See the new instructions.   budesonide 0.25 MG/2ML nebulizer solution Commonly known as: Pulmicort One vial first thing in am and with evening dose of duoneb What changed:  how much to take how to take this when to take this additional instructions   Carboxymethylcellulose Sod PF 0.5 % Soln Place 2 drops into  both eyes daily as needed (for dry eye relief).   cyanocobalamin 1000 MCG tablet Take 1 tablet (1,000 mcg total) by mouth daily. What changed: how much to take   diltiazem 120 MG 24 hr capsule Commonly known as: CARDIZEM CD Take 1 capsule (120 mg total) by mouth daily.   esomeprazole 40 MG capsule Commonly known as: NexIUM Take 1 capsule (40 mg total) by mouth 2 (two) times daily before a meal.   furosemide 40 MG tablet Commonly known as: Lasix Take 1 tablet (40 mg total) by mouth 2 (two) times daily.   gabapentin 400 MG capsule Commonly known as: NEURONTIN Take 1 capsule (400 mg total) by mouth 3 (three) times daily.   Gemtesa 75 MG Tabs Generic drug: Vibegron Take 1 tablet (75 mg total) by mouth daily.   glipiZIDE XL 5 MG 24 hr tablet Generic drug: glipiZIDE TAKE ONE TABLET BY MOUTH EVERY DAY WITH BREAKFAST What changed: See the new instructions.   guaiFENesin 600 MG 12 hr tablet Commonly known as: Mucinex Take 1 tablet (600 mg total) by mouth 2 (two) times daily as needed.   hydrocortisone valerate cream 0.2 % Commonly known as: WESTCORT Apply topically 2 (two) times daily. Apply to sparingly to both legs for treatment of stasis dermatitis.   Insulin Lispro Prot & Lispro (75-25) 100 UNIT/ML Kwikpen Commonly known as: HUMALOG 75/25 MIX Inject 40-50 Units into the skin 2 (two) times daily before a meal.   ipratropium-albuterol 0.5-2.5 (3) MG/3ML Soln Commonly known as: DUONEB Take 3 mLs by nebulization 4 (four) times daily.   Iron (Ferrous Sulfate) 325 (65 Fe) MG Tabs Take 325 mg by mouth daily.   levothyroxine 175 MCG tablet Commonly known as: SYNTHROID Take 1 tablet (175 mcg total) by mouth every morning.   nitroGLYCERIN 0.4 MG SL tablet Commonly known as: NITROSTAT Place 1 tablet (0.4 mg total) under the tongue every 5 (five) minutes as needed for chest pain.   nystatin powder Commonly known as: MYCOSTATIN/NYSTOP Apply topically 3 (three) times daily.  Apply to to affected areas (groin/skin folds)   Salonpas Pain Relief Patch Ptch Apply 1 patch topically daily as needed (pain).   triamcinolone cream 0.1 % Commonly known as: KENALOG Apply 1 Application topically 2 (two) times daily. What changed:  when to take this reasons to take this   Vitamin D3 125 MCG (5000 UT) Caps Take 1 capsule (5,000 Units total) by mouth daily.        Contact information for follow-up providers     Fanta, Wayland Salinas, MD. Schedule an appointment as soon as possible for a visit in 1 week(s).   Specialty: Internal Medicine Contact information: [address] [place] [address] [phone]              Contact information for after-discharge care     Destination     [facility] Preferred SNF .   Service: Skilled Nursing Contact information: [address] [place] [address] [phone]  Allergies  Allergen Reactions   Ace Inhibitors Other (See Comments)    Reaction unknown   Asa [Aspirin] Other (See Comments)    Causes bleeding   Breztri Aerosphere [Budeson-Glycopyrrol-Formoterol]    Tape Other (See Comments)    Skin tearing, causes scars  Other Reaction(s): Other (See Comments)   Niacin Rash   Reglan [Metoclopramide] Anxiety    Consultations: None   Procedures/Studies: DG Chest 1 View  Result Date: 03/22/2023 CLINICAL DATA:  Dyspnea, shortness of breath. EXAM: CHEST  1 VIEW COMPARISON:  Chest radiograph dated 03/21/2023. FINDINGS: The heart is enlarged. Vascular calcifications are seen in the aortic arch. Left basilar atelectasis/airspace disease appears similar to prior exam. A left pleural effusion may contribute. There is scarring in the left mid lung, unchanged. The right lung is clear and there is no right pleural effusion. No pneumothorax on either side. Degenerative changes are seen in the spine. IMPRESSION: Left basilar  atelectasis/airspace disease appears similar to prior exam. A left pleural effusion may contribute. Electronically Signed   By: Romona Curls M.D.   On: 03/22/2023 16:58   DG Chest Port 1 View  Result Date: 03/21/2023 CLINICAL DATA:  Provided history: Shortness of breath.  Dizziness. EXAM: PORTABLE CHEST 1 VIEW COMPARISON:  Prior chest radiographs 03/16/2023 and earlier. FINDINGS: Cardiomegaly. Prominence of the main pulmonary artery contour. Aortic atherosclerosis. Redemonstrated foci of scarring within the left mid lung. Opacity at the lateral left lung base with blunting of the left lateral costophrenic angle. No appreciable airspace consolidation on the right. No evidence of pneumothorax. No acute osseous abnormality identified. ACDF hardware. IMPRESSION: 1. Opacity at the lateral left lung base with blunting of the left lateral costophrenic angle. This may reflect a small left pleural effusion, atelectasis and/or airspace consolidation. These findings are new from the prior examination of 03/16/2023. 2. Foci of scarring within the mid left lung. 3. Prominence of the main pulmonary artery contour suggesting pulmonary arterial hypertension. 4. Cardiomegaly. 5. Aortic Atherosclerosis (ICD10-I70.0). Electronically Signed   By: Jackey Loge D.O.   On: 03/21/2023 13:28   DG Chest Port 1 View  Result Date: 03/16/2023 CLINICAL DATA:  Shortness of breath and hypoxia. Lower extremity cellulitis. EXAM: PORTABLE CHEST 1 VIEW COMPARISON:  12/16/2022 FINDINGS: Stable cardiomegaly and scarring in left midlung. No evidence of acute infiltrate or edema. Cervical spine fusion hardware noted. IMPRESSION: Stable cardiomegaly and left midlung scarring. No active disease. Electronically Signed   By: Danae Orleans M.D.   On: 03/16/2023 10:37     Discharge Exam: Vitals:   03/26/23 0351 03/26/23 0816  BP: (!) 152/90   Pulse: 68   Resp: 20   Temp: 98 F (36.7 C)   SpO2:  97%   Vitals:   03/25/23 2012 03/25/23 2024  03/26/23 0351 03/26/23 0816  BP:  125/65 (!) 152/90   Pulse:  77 68   Resp:  20 20   Temp:  98 F (36.7 C) 98 F (36.7 C)   TempSrc:   Oral   SpO2: 97% 98%  97%  Weight:      Height:        General: Pt is alert, awake, not in acute distress, morbidly obese Cardiovascular: RRR, S1/S2 +, no rubs, no gallops Respiratory: CTA bilaterally, no wheezing, no rhonchi, nasal cannula oxygen Abdominal: Soft, NT, ND, bowel sounds + Extremities: no edema, no cyanosis    The results of significant diagnostics from this hospitalization (including imaging, microbiology, ancillary and laboratory) are listed below for  reference.     Microbiology: Recent Results (from the past 240 hour(s))  Resp panel by RT-PCR (RSV, Flu A&B, Covid) Anterior Nasal Swab     Status: None   Collection Time: 03/16/23  1:03 PM   Specimen: Anterior Nasal Swab  Result Value Ref Range Status   SARS Coronavirus 2 by RT PCR NEGATIVE NEGATIVE Final    Comment: (NOTE) SARS-CoV-2 target nucleic acids are NOT DETECTED.  The SARS-CoV-2 RNA is generally detectable in upper respiratory specimens during the acute phase of infection. The lowest concentration of SARS-CoV-2 viral copies this assay can detect is 138 copies/mL. A negative result does not preclude SARS-Cov-2 infection and should not be used as the sole basis for treatment or other patient management decisions. A negative result may occur with  improper specimen collection/handling, submission of specimen other than nasopharyngeal swab, presence of viral mutation(s) within the areas targeted by this assay, and inadequate number of viral copies(<138 copies/mL). A negative result must be combined with clinical observations, patient history, and epidemiological information. The expected result is Negative.  Fact Sheet for Patients:  BloggerCourse.com  Fact Sheet for Healthcare Providers:  SeriousBroker.it  This  test is no t yet approved or cleared by the [place] FDA and  has been authorized for detection and/or diagnosis of SARS-CoV-2 by FDA under an Emergency Use Authorization (EUA). This EUA will remain  in effect (meaning this test can be used) for the duration of the COVID-19 declaration under Section 564(b)(1) of the Act, 21 U.S.C.section 360bbb-3(b)(1), unless the authorization is terminated  or revoked sooner.       Influenza A by PCR NEGATIVE NEGATIVE Final   Influenza B by PCR NEGATIVE NEGATIVE Final    Comment: (NOTE) The Xpert Xpress SARS-CoV-2/FLU/RSV plus assay is intended as an aid in the diagnosis of influenza from Nasopharyngeal swab specimens and should not be used as a sole basis for treatment. Nasal washings and aspirates are unacceptable for Xpert Xpress SARS-CoV-2/FLU/RSV testing.  Fact Sheet for Patients: BloggerCourse.com  Fact Sheet for Healthcare Providers: SeriousBroker.it  This test is not yet approved or cleared by the [place] FDA and has been authorized for detection and/or diagnosis of SARS-CoV-2 by FDA under an Emergency Use Authorization (EUA). This EUA will remain in effect (meaning this test can be used) for the duration of the COVID-19 declaration under Section 564(b)(1) of the Act, 21 U.S.C. section 360bbb-3(b)(1), unless the authorization is terminated or revoked.     Resp Syncytial Virus by PCR NEGATIVE NEGATIVE Final    Comment: (NOTE) Fact Sheet for Patients: BloggerCourse.com  Fact Sheet for Healthcare Providers: SeriousBroker.it  This test is not yet approved or cleared by the [place] FDA and has been authorized for detection and/or diagnosis of SARS-CoV-2 by FDA under an Emergency Use Authorization (EUA). This EUA will remain in effect (meaning this test can be used) for the duration of the COVID-19 declaration under  Section 564(b)(1) of the Act, 21 U.S.C. section 360bbb-3(b)(1), unless the authorization is terminated or revoked.  Performed at [facility], [address] [place], [place] [address]   Culture, blood (Routine X 2) w Reflex to ID Panel     Status: None   Collection Time: 03/17/23  2:31 PM   Specimen: BLOOD  Result Value Ref Range Status   Specimen Description BLOOD BLOOD LEFT ARM  Final   Special Requests NONE  Final   Culture   Final    NO GROWTH 5 DAYS Performed at [facility]  [facility], [address], [place], [place] [address]    Report Status 03/22/2023 FINAL  Final  Culture, blood (Routine X 2) w Reflex to ID Panel     Status: None   Collection Time: 03/17/23  2:41 PM   Specimen: BLOOD  Result Value Ref Range Status   Specimen Description BLOOD BLOOD LEFT HAND  Final   Special Requests NONE  Final   Culture   Final    NO GROWTH 5 DAYS Performed at [facility], [address] [place], [place] [address]    Report Status 03/22/2023 FINAL  Final  Resp panel by RT-PCR (RSV, Flu A&B, Covid) Anterior Nasal Swab     Status: None   Collection Time: 03/21/23  1:17 PM   Specimen: Anterior Nasal Swab  Result Value Ref Range Status   SARS Coronavirus 2 by RT PCR NEGATIVE NEGATIVE Final    Comment: (NOTE) SARS-CoV-2 target nucleic acids are NOT DETECTED.  The SARS-CoV-2 RNA is generally detectable in upper respiratory specimens during the acute phase of infection. The lowest concentration of SARS-CoV-2 viral copies this assay can detect is 138 copies/mL. A negative result does not preclude SARS-Cov-2 infection and should not be used as the sole basis for treatment or other patient management decisions. A negative result may occur with  improper specimen collection/handling, submission of specimen other than nasopharyngeal swab, presence of viral mutation(s) within the areas targeted by this assay, and inadequate number of viral copies(<138 copies/mL). A negative result must be combined  with clinical observations, patient history, and epidemiological information. The expected result is Negative.  Fact Sheet for Patients:  BloggerCourse.com  Fact Sheet for Healthcare Providers:  SeriousBroker.it  This test is no t yet approved or cleared by the [place] FDA and  has been authorized for detection and/or diagnosis of SARS-CoV-2 by FDA under an Emergency Use Authorization (EUA). This EUA will remain  in effect (meaning this test can be used) for the duration of the COVID-19 declaration under Section 564(b)(1) of the Act, 21 U.S.C.section 360bbb-3(b)(1), unless the authorization is terminated  or revoked sooner.       Influenza A by PCR NEGATIVE NEGATIVE Final   Influenza B by PCR NEGATIVE NEGATIVE Final    Comment: (NOTE) The Xpert Xpress SARS-CoV-2/FLU/RSV plus assay is intended as an aid in the diagnosis of influenza from Nasopharyngeal swab specimens and should not be used as a sole basis for treatment. Nasal washings and aspirates are unacceptable for Xpert Xpress SARS-CoV-2/FLU/RSV testing.  Fact Sheet for Patients: BloggerCourse.com  Fact Sheet for Healthcare Providers: SeriousBroker.it  This test is not yet approved or cleared by the [place] FDA and has been authorized for detection and/or diagnosis of SARS-CoV-2 by FDA under an Emergency Use Authorization (EUA). This EUA will remain in effect (meaning this test can be used) for the duration of the COVID-19 declaration under Section 564(b)(1) of the Act, 21 U.S.C. section 360bbb-3(b)(1), unless the authorization is terminated or revoked.     Resp Syncytial Virus by PCR NEGATIVE NEGATIVE Final    Comment: (NOTE) Fact Sheet for Patients: BloggerCourse.com  Fact Sheet for Healthcare Providers: SeriousBroker.it  This test is not yet approved  or cleared by the [place] FDA and has been authorized for detection and/or diagnosis of SARS-CoV-2 by FDA under an Emergency Use Authorization (EUA). This EUA will remain in effect (meaning this test can be used) for the duration of the COVID-19 declaration under Section 564(b)(1) of the Act, 21 U.S.C. section  360bbb-3(b)(1), unless the authorization is terminated or revoked.  Performed at [facility], [address], [place], [place] [address]   Culture, blood (routine x 2)     Status: None (Preliminary result)   Collection Time: 03/21/23  1:30 PM   Specimen: BLOOD  Result Value Ref Range Status   Specimen Description BLOOD RIGHT ASSIST CONTROL  Final   Special Requests   Final    BOTTLES DRAWN AEROBIC AND ANAEROBIC Blood Culture adequate volume   Culture   Final    NO GROWTH 4 DAYS Performed at [facility], [address] [place], [place] [address]    Report Status PENDING  Incomplete  Culture, blood (routine x 2)     Status: None (Preliminary result)   Collection Time: 03/21/23  1:45 PM   Specimen: BLOOD  Result Value Ref Range Status   Specimen Description BLOOD BLOOD LEFT HAND  Final   Special Requests   Final    BOTTLES DRAWN AEROBIC AND ANAEROBIC Blood Culture results may not be optimal due to an excessive volume of blood received in culture bottles   Culture   Final    NO GROWTH 4 DAYS Performed at [facility], [address] [place], [place] [address]    Report Status PENDING  Incomplete     Labs: BNP (last 3 results) Recent Labs    12/03/22 1116 03/16/23 0958 03/21/23 1345  BNP 267.0* 344.0* 1,254.0*   Basic Metabolic Panel: Recent Labs  Lab 03/21/23 1345 03/22/23 0436 03/23/23 0435 03/24/23 0454  NA 137 136 134* 134*  K 3.1* 4.5 4.0 4.1  CL 91* 93* 92* 94*  CO2 35* 28 29 29   GLUCOSE 214* 315* 243* 200*  BUN 14 20 29* 28*  CREATININE 0.60 0.65 0.74 0.72  CALCIUM 8.9 8.6* 8.7* 8.6*  MG 2.1  --   --   --    Liver Function Tests: Recent  Labs  Lab 03/21/23 1345  AST 19  ALT 17  ALKPHOS 69  BILITOT 1.2  PROT 6.9  ALBUMIN 3.4*   No results for input(s): "LIPASE", "AMYLASE" in the last 168 hours. No results for input(s): "AMMONIA" in the last 168 hours. CBC: Recent Labs  Lab 03/21/23 1345 03/22/23 0436 03/23/23 0435  WBC 12.2* 9.5 14.2*  NEUTROABS 9.7*  --   --   HGB 14.9 13.6 12.9  HCT 46.3* 42.1 40.2  MCV 90.4 92.1 92.4  PLT 253 237 229   Cardiac Enzymes: No results for input(s): "CKTOTAL", "CKMB", "CKMBINDEX", "TROPONINI" in the last 168 hours. BNP: Invalid input(s): "POCBNP" CBG: Recent Labs  Lab 03/25/23 0102 03/25/23 0711 03/25/23 1156 03/25/23 1609 03/25/23 2027  GLUCAP 297* 189* 88 199* 254*   D-Dimer No results for input(s): "DDIMER" in the last 72 hours. Hgb A1c No results for input(s): "HGBA1C" in the last 72 hours. Lipid Profile No results for input(s): "CHOL", "HDL", "LDLCALC", "TRIG", "CHOLHDL", "LDLDIRECT" in the last 72 hours. Thyroid function studies No results for input(s): "TSH", "T4TOTAL", "T3FREE", "THYROIDAB" in the last 72 hours.  Invalid input(s): "FREET3" Anemia work up No results for input(s): "VITAMINB12", "FOLATE", "FERRITIN", "TIBC", "IRON", "RETICCTPCT" in the last 72 hours. Urinalysis    Component Value Date/Time   COLORURINE YELLOW 03/21/2023 1345   APPEARANCEUR HAZY (A) 03/21/2023 1345   APPEARANCEUR Cloudy (A) 08/19/2022 1413   LABSPEC 1.010 03/21/2023 1345   PHURINE 7.0 03/21/2023 1345   GLUCOSEU 50 (A) 03/21/2023 1345   HGBUR NEGATIVE 03/21/2023 1345   BILIRUBINUR NEGATIVE  03/21/2023 1345   BILIRUBINUR Negative 08/19/2022 1413   KETONESUR 5 (A) 03/21/2023 1345   PROTEINUR 100 (A) 03/21/2023 1345   UROBILINOGEN 0.2 03/15/2013 1504   NITRITE NEGATIVE 03/21/2023 1345   LEUKOCYTESUR NEGATIVE 03/21/2023 1345   Sepsis Labs Recent Labs  Lab 03/21/23 1345 03/22/23 0436 03/23/23 0435  WBC 12.2* 9.5 14.2*   Microbiology Recent Results (from the past  240 hour(s))  Resp panel by RT-PCR (RSV, Flu A&B, Covid) Anterior Nasal Swab     Status: None   Collection Time: 03/16/23  1:03 PM   Specimen: Anterior Nasal Swab  Result Value Ref Range Status   SARS Coronavirus 2 by RT PCR NEGATIVE NEGATIVE Final    Comment: (NOTE) SARS-CoV-2 target nucleic acids are NOT DETECTED.  The SARS-CoV-2 RNA is generally detectable in upper respiratory specimens during the acute phase of infection. The lowest concentration of SARS-CoV-2 viral copies this assay can detect is 138 copies/mL. A negative result does not preclude SARS-Cov-2 infection and should not be used as the sole basis for treatment or other patient management decisions. A negative result may occur with  improper specimen collection/handling, submission of specimen other than nasopharyngeal swab, presence of viral mutation(s) within the areas targeted by this assay, and inadequate number of viral copies(<138 copies/mL). A negative result must be combined with clinical observations, patient history, and epidemiological information. The expected result is Negative.  Fact Sheet for Patients:  BloggerCourse.com  Fact Sheet for Healthcare Providers:  SeriousBroker.it  This test is no t yet approved or cleared by the [place] FDA and  has been authorized for detection and/or diagnosis of SARS-CoV-2 by FDA under an Emergency Use Authorization (EUA). This EUA will remain  in effect (meaning this test can be used) for the duration of the COVID-19 declaration under Section 564(b)(1) of the Act, 21 U.S.C.section 360bbb-3(b)(1), unless the authorization is terminated  or revoked sooner.       Influenza A by PCR NEGATIVE NEGATIVE Final   Influenza B by PCR NEGATIVE NEGATIVE Final    Comment: (NOTE) The Xpert Xpress SARS-CoV-2/FLU/RSV plus assay is intended as an aid in the diagnosis of influenza from Nasopharyngeal swab specimens and should  not be used as a sole basis for treatment. Nasal washings and aspirates are unacceptable for Xpert Xpress SARS-CoV-2/FLU/RSV testing.  Fact Sheet for Patients: BloggerCourse.com  Fact Sheet for Healthcare Providers: SeriousBroker.it  This test is not yet approved or cleared by the [place] FDA and has been authorized for detection and/or diagnosis of SARS-CoV-2 by FDA under an Emergency Use Authorization (EUA). This EUA will remain in effect (meaning this test can be used) for the duration of the COVID-19 declaration under Section 564(b)(1) of the Act, 21 U.S.C. section 360bbb-3(b)(1), unless the authorization is terminated or revoked.     Resp Syncytial Virus by PCR NEGATIVE NEGATIVE Final    Comment: (NOTE) Fact Sheet for Patients: BloggerCourse.com  Fact Sheet for Healthcare Providers: SeriousBroker.it  This test is not yet approved or cleared by the [place] FDA and has been authorized for detection and/or diagnosis of SARS-CoV-2 by FDA under an Emergency Use Authorization (EUA). This EUA will remain in effect (meaning this test can be used) for the duration of the COVID-19 declaration under Section 564(b)(1) of the Act, 21 U.S.C. section 360bbb-3(b)(1), unless the authorization is terminated or revoked.  Performed at [facility], [address], [place], [place] [address]   Culture, blood (Routine X 2) w Reflex to ID Panel  Status: None   Collection Time: 03/17/23  2:31 PM   Specimen: BLOOD  Result Value Ref Range Status   Specimen Description BLOOD BLOOD LEFT ARM  Final   Special Requests NONE  Final   Culture   Final    NO GROWTH 5 DAYS Performed at [facility], [address], [place], [place] [address]    Report Status 03/22/2023 FINAL  Final  Culture, blood (Routine X 2) w Reflex to ID Panel     Status: None   Collection Time: 03/17/23  2:41 PM    Specimen: BLOOD  Result Value Ref Range Status   Specimen Description BLOOD BLOOD LEFT HAND  Final   Special Requests NONE  Final   Culture   Final    NO GROWTH 5 DAYS Performed at [facility], [address], [place], [place] [address]    Report Status 03/22/2023 FINAL  Final  Resp panel by RT-PCR (RSV, Flu A&B, Covid) Anterior Nasal Swab     Status: None   Collection Time: 03/21/23  1:17 PM   Specimen: Anterior Nasal Swab  Result Value Ref Range Status   SARS Coronavirus 2 by RT PCR NEGATIVE NEGATIVE Final    Comment: (NOTE) SARS-CoV-2 target nucleic acids are NOT DETECTED.  The SARS-CoV-2 RNA is generally detectable in upper respiratory specimens during the acute phase of infection. The lowest concentration of SARS-CoV-2 viral copies this assay can detect is 138 copies/mL. A negative result does not preclude SARS-Cov-2 infection and should not be used as the sole basis for treatment or other patient management decisions. A negative result may occur with  improper specimen collection/handling, submission of specimen other than nasopharyngeal swab, presence of viral mutation(s) within the areas targeted by this assay, and inadequate number of viral copies(<138 copies/mL). A negative result must be combined with clinical observations, patient history, and epidemiological information. The expected result is Negative.  Fact Sheet for Patients:  BloggerCourse.com  Fact Sheet for Healthcare Providers:  SeriousBroker.it  This test is no t yet approved or cleared by the [place] FDA and  has been authorized for detection and/or diagnosis of SARS-CoV-2 by FDA under an Emergency Use Authorization (EUA). This EUA will remain  in effect (meaning this test can be used) for the duration of the COVID-19 declaration under Section 564(b)(1) of the Act, 21 U.S.C.section 360bbb-3(b)(1), unless the authorization is terminated  or revoked  sooner.       Influenza A by PCR NEGATIVE NEGATIVE Final   Influenza B by PCR NEGATIVE NEGATIVE Final    Comment: (NOTE) The Xpert Xpress SARS-CoV-2/FLU/RSV plus assay is intended as an aid in the diagnosis of influenza from Nasopharyngeal swab specimens and should not be used as a sole basis for treatment. Nasal washings and aspirates are unacceptable for Xpert Xpress SARS-CoV-2/FLU/RSV testing.  Fact Sheet for Patients: BloggerCourse.com  Fact Sheet for Healthcare Providers: SeriousBroker.it  This test is not yet approved or cleared by the [place] FDA and has been authorized for detection and/or diagnosis of SARS-CoV-2 by FDA under an Emergency Use Authorization (EUA). This EUA will remain in effect (meaning this test can be used) for the duration of the COVID-19 declaration under Section 564(b)(1) of the Act, 21 U.S.C. section 360bbb-3(b)(1), unless the authorization is terminated or revoked.     Resp Syncytial Virus by PCR NEGATIVE NEGATIVE Final    Comment: (NOTE) Fact Sheet for Patients: BloggerCourse.com  Fact Sheet for Healthcare Providers: SeriousBroker.it  This test is not yet approved  or cleared by the [place] and has been authorized for detection and/or diagnosis of SARS-CoV-2 by FDA under an Emergency Use Authorization (EUA). This EUA will remain in effect (meaning this test can be used) for the duration of the COVID-19 declaration under Section 564(b)(1) of the Act, 21 U.S.C. section 360bbb-3(b)(1), unless the authorization is terminated or revoked.  Performed at [facility], [address], [place], [place] [address]   Culture, blood (routine x 2)     Status: None (Preliminary result)   Collection Time: 03/21/23  1:30 PM   Specimen: BLOOD  Result Value Ref Range Status   Specimen Description BLOOD RIGHT ASSIST CONTROL  Final   Special  Requests   Final    BOTTLES DRAWN AEROBIC AND ANAEROBIC Blood Culture adequate volume   Culture   Final    NO GROWTH 4 DAYS Performed at [facility], [address], [place], [place] [address]    Report Status PENDING  Incomplete  Culture, blood (routine x 2)     Status: None (Preliminary result)   Collection Time: 03/21/23  1:45 PM   Specimen: BLOOD  Result Value Ref Range Status   Specimen Description BLOOD BLOOD LEFT HAND  Final   Special Requests   Final    BOTTLES DRAWN AEROBIC AND ANAEROBIC Blood Culture results may not be optimal due to an excessive volume of blood received in culture bottles   Culture   Final    NO GROWTH 4 DAYS Performed at [facility], [address] [place], [place] [address]    Report Status PENDING  Incomplete     Time coordinating discharge: 35 minutes  SIGNED:   Erick Blinks, DO [facility] 03/26/2023, 10:07 AM  If 7PM-7AM, please contact night-coverage [email or web address]

## 2023-03-26 NOTE — TOC Transition Note (Signed)
Transition of Care [facility]) - CM/SW Discharge Note   Patient Details  Name: Ann [name] MRN: [id number] Date of Birth: 03/15/1946  [facility] CM/SW Contact:  Karn Cassis, LCSW Phone Number: 03/26/2023, 10:54 AM   Clinical Narrative: Pt d/c today to [facility]. Pt and facility aware and agreeable. Authorization received. Pt will transfer with staff. D/C summary sent to SNF. RN given number to call report.       Final next level of care: Skilled Nursing Facility Barriers to Discharge: Barriers Resolved   Patient Goals and CMS Choice      Discharge Placement                Patient chooses bed at: [facility] Patient to be transferred to facility by: staff Name of family member notified: pt only Patient and family notified of of transfer: 03/26/23  Discharge Plan and Services Additional resources added to the After Visit Summary for                                       Social Determinants of Health (SDOH) Interventions SDOH Screenings   Food Insecurity: No Food Insecurity (03/21/2023)  Housing: Low Risk  (03/21/2023)  Transportation Needs: No Transportation Needs (03/21/2023)  Utilities: Not At Risk (03/21/2023)  Alcohol Screen: Low Risk  (10/22/2022)  Depression (PHQ2-9): Low Risk  (10/30/2022)  Recent Concern: Depression (PHQ2-9) - High Risk (10/16/2022)  Financial Resource Strain: Low Risk  (10/22/2022)  Physical Activity: Insufficiently Active (10/22/2022)  Social Connections: Moderately Integrated (10/22/2022)  Stress: No Stress Concern Present (10/22/2022)  Tobacco Use: Medium Risk (03/21/2023)     Readmission Risk Interventions    03/22/2023    1:49 PM 03/17/2023    5:19 PM 12/04/2022   10:26 AM  Readmission Risk Prevention Plan  Transportation Screening  Complete Complete  PCP or Specialist Appt within 3-5 Days Complete    HRI or Home Care Consult Complete Complete Complete  Social Work Consult for Recovery Care  Planning/Counseling Complete Complete Complete  Palliative Care Screening Complete Not Applicable Not Applicable  Medication Review Oceanographer) Complete Complete Complete

## 2023-03-26 NOTE — Plan of Care (Signed)
Pt continues to complain of irritation on right and left buttocks and inner thighs where aggravated skin area exists. Applied prescribed Nystatin powder. Pt claims the powder provides some relief. Will monitor pt for improved skin integrity during nursing shift.

## 2023-03-26 NOTE — Progress Notes (Signed)
Called report to Nurse Lynden Ang at [facility]. Pt will be transported by staff after lunch.

## 2023-03-27 LAB — CULTURE, BLOOD (ROUTINE X 2)
Culture: NO GROWTH
Culture: NO GROWTH
Special Requests: ADEQUATE

## 2023-03-28 DIAGNOSIS — I5033 Acute on chronic diastolic (congestive) heart failure: Secondary | ICD-10-CM | POA: Diagnosis not present

## 2023-03-28 DIAGNOSIS — J9601 Acute respiratory failure with hypoxia: Secondary | ICD-10-CM | POA: Diagnosis not present

## 2023-03-28 DIAGNOSIS — I4891 Unspecified atrial fibrillation: Secondary | ICD-10-CM | POA: Diagnosis not present

## 2023-04-03 DIAGNOSIS — Z23 Encounter for immunization: Secondary | ICD-10-CM | POA: Diagnosis not present

## 2023-04-03 DIAGNOSIS — E1165 Type 2 diabetes mellitus with hyperglycemia: Secondary | ICD-10-CM | POA: Diagnosis not present

## 2023-04-03 DIAGNOSIS — N1832 Chronic kidney disease, stage 3b: Secondary | ICD-10-CM | POA: Diagnosis not present

## 2023-04-03 DIAGNOSIS — J9621 Acute and chronic respiratory failure with hypoxia: Secondary | ICD-10-CM | POA: Diagnosis not present

## 2023-04-03 DIAGNOSIS — I509 Heart failure, unspecified: Secondary | ICD-10-CM | POA: Diagnosis not present

## 2023-04-08 ENCOUNTER — Inpatient Hospital Stay: Payer: Medicare HMO | Attending: Hematology

## 2023-04-08 DIAGNOSIS — Z86718 Personal history of other venous thrombosis and embolism: Secondary | ICD-10-CM | POA: Insufficient documentation

## 2023-04-08 DIAGNOSIS — Z7901 Long term (current) use of anticoagulants: Secondary | ICD-10-CM | POA: Insufficient documentation

## 2023-04-08 DIAGNOSIS — Z7984 Long term (current) use of oral hypoglycemic drugs: Secondary | ICD-10-CM | POA: Insufficient documentation

## 2023-04-08 DIAGNOSIS — G4730 Sleep apnea, unspecified: Secondary | ICD-10-CM | POA: Diagnosis not present

## 2023-04-08 DIAGNOSIS — H409 Unspecified glaucoma: Secondary | ICD-10-CM | POA: Diagnosis not present

## 2023-04-08 DIAGNOSIS — I130 Hypertensive heart and chronic kidney disease with heart failure and stage 1 through stage 4 chronic kidney disease, or unspecified chronic kidney disease: Secondary | ICD-10-CM | POA: Insufficient documentation

## 2023-04-08 DIAGNOSIS — D509 Iron deficiency anemia, unspecified: Secondary | ICD-10-CM | POA: Diagnosis not present

## 2023-04-08 DIAGNOSIS — Z87891 Personal history of nicotine dependence: Secondary | ICD-10-CM | POA: Diagnosis not present

## 2023-04-08 DIAGNOSIS — Z794 Long term (current) use of insulin: Secondary | ICD-10-CM | POA: Diagnosis not present

## 2023-04-08 DIAGNOSIS — K219 Gastro-esophageal reflux disease without esophagitis: Secondary | ICD-10-CM | POA: Diagnosis not present

## 2023-04-08 DIAGNOSIS — E039 Hypothyroidism, unspecified: Secondary | ICD-10-CM | POA: Insufficient documentation

## 2023-04-08 DIAGNOSIS — I4821 Permanent atrial fibrillation: Secondary | ICD-10-CM | POA: Diagnosis not present

## 2023-04-08 DIAGNOSIS — N1832 Chronic kidney disease, stage 3b: Secondary | ICD-10-CM | POA: Diagnosis not present

## 2023-04-08 DIAGNOSIS — J9611 Chronic respiratory failure with hypoxia: Secondary | ICD-10-CM | POA: Diagnosis not present

## 2023-04-08 DIAGNOSIS — D500 Iron deficiency anemia secondary to blood loss (chronic): Secondary | ICD-10-CM

## 2023-04-08 DIAGNOSIS — E2740 Unspecified adrenocortical insufficiency: Secondary | ICD-10-CM | POA: Insufficient documentation

## 2023-04-08 DIAGNOSIS — Z79899 Other long term (current) drug therapy: Secondary | ICD-10-CM | POA: Insufficient documentation

## 2023-04-08 DIAGNOSIS — E785 Hyperlipidemia, unspecified: Secondary | ICD-10-CM | POA: Insufficient documentation

## 2023-04-08 DIAGNOSIS — L03116 Cellulitis of left lower limb: Secondary | ICD-10-CM | POA: Insufficient documentation

## 2023-04-08 DIAGNOSIS — E1122 Type 2 diabetes mellitus with diabetic chronic kidney disease: Secondary | ICD-10-CM | POA: Insufficient documentation

## 2023-04-08 LAB — IRON AND TIBC
Iron: 66 ug/dL (ref 28–170)
Saturation Ratios: 18 % (ref 10.4–31.8)
TIBC: 364 ug/dL (ref 250–450)
UIBC: 298 ug/dL

## 2023-04-08 LAB — CBC WITH DIFFERENTIAL/PLATELET
Abs Immature Granulocytes: 0.03 10*3/uL (ref 0.00–0.07)
Basophils Absolute: 0 10*3/uL (ref 0.0–0.1)
Basophils Relative: 0 %
Eosinophils Absolute: 0.2 10*3/uL (ref 0.0–0.5)
Eosinophils Relative: 2 %
HCT: 42 % (ref 36.0–46.0)
Hemoglobin: 12.5 g/dL (ref 12.0–15.0)
Immature Granulocytes: 0 %
Lymphocytes Relative: 12 %
Lymphs Abs: 1 10*3/uL (ref 0.7–4.0)
MCH: 28.8 pg (ref 26.0–34.0)
MCHC: 29.8 g/dL — ABNORMAL LOW (ref 30.0–36.0)
MCV: 96.8 fL (ref 80.0–100.0)
Monocytes Absolute: 0.4 10*3/uL (ref 0.1–1.0)
Monocytes Relative: 5 %
Neutro Abs: 6.8 10*3/uL (ref 1.7–7.7)
Neutrophils Relative %: 81 %
Platelets: 179 10*3/uL (ref 150–400)
RBC: 4.34 MIL/uL (ref 3.87–5.11)
RDW: 13.9 % (ref 11.5–15.5)
WBC: 8.4 10*3/uL (ref 4.0–10.5)
nRBC: 0 % (ref 0.0–0.2)

## 2023-04-08 LAB — FERRITIN: Ferritin: 37 ng/mL (ref 11–307)
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
## 2023-04-15 ENCOUNTER — Inpatient Hospital Stay (HOSPITAL_BASED_OUTPATIENT_CLINIC_OR_DEPARTMENT_OTHER): Payer: Medicare HMO | Admitting: Physician Assistant

## 2023-04-15 ENCOUNTER — Other Ambulatory Visit: Payer: Self-pay | Admitting: Nurse Practitioner

## 2023-04-15 VITALS — BP 143/73 | HR 76 | Temp 97.6°F | Resp 20 | Wt 275.0 lb

## 2023-04-15 DIAGNOSIS — J9611 Chronic respiratory failure with hypoxia: Secondary | ICD-10-CM | POA: Diagnosis not present

## 2023-04-15 DIAGNOSIS — E1122 Type 2 diabetes mellitus with diabetic chronic kidney disease: Secondary | ICD-10-CM | POA: Diagnosis not present

## 2023-04-15 DIAGNOSIS — D500 Iron deficiency anemia secondary to blood loss (chronic): Secondary | ICD-10-CM

## 2023-04-15 DIAGNOSIS — H409 Unspecified glaucoma: Secondary | ICD-10-CM | POA: Diagnosis not present

## 2023-04-15 DIAGNOSIS — Z794 Long term (current) use of insulin: Secondary | ICD-10-CM | POA: Diagnosis not present

## 2023-04-15 DIAGNOSIS — N1832 Chronic kidney disease, stage 3b: Secondary | ICD-10-CM

## 2023-04-15 DIAGNOSIS — I130 Hypertensive heart and chronic kidney disease with heart failure and stage 1 through stage 4 chronic kidney disease, or unspecified chronic kidney disease: Secondary | ICD-10-CM | POA: Diagnosis not present

## 2023-04-15 DIAGNOSIS — Z86718 Personal history of other venous thrombosis and embolism: Secondary | ICD-10-CM | POA: Diagnosis not present

## 2023-04-15 DIAGNOSIS — L03116 Cellulitis of left lower limb: Secondary | ICD-10-CM | POA: Diagnosis not present

## 2023-04-15 DIAGNOSIS — E039 Hypothyroidism, unspecified: Secondary | ICD-10-CM | POA: Diagnosis not present

## 2023-04-15 DIAGNOSIS — K219 Gastro-esophageal reflux disease without esophagitis: Secondary | ICD-10-CM | POA: Diagnosis not present

## 2023-04-15 DIAGNOSIS — I4821 Permanent atrial fibrillation: Secondary | ICD-10-CM

## 2023-04-15 DIAGNOSIS — Z87891 Personal history of nicotine dependence: Secondary | ICD-10-CM

## 2023-04-15 DIAGNOSIS — D509 Iron deficiency anemia, unspecified: Secondary | ICD-10-CM

## 2023-04-15 DIAGNOSIS — Z7984 Long term (current) use of oral hypoglycemic drugs: Secondary | ICD-10-CM | POA: Diagnosis not present

## 2023-04-15 DIAGNOSIS — E2740 Unspecified adrenocortical insufficiency: Secondary | ICD-10-CM

## 2023-04-15 DIAGNOSIS — G4730 Sleep apnea, unspecified: Secondary | ICD-10-CM | POA: Diagnosis not present

## 2023-04-15 DIAGNOSIS — Z7901 Long term (current) use of anticoagulants: Secondary | ICD-10-CM

## 2023-04-15 DIAGNOSIS — Z79899 Other long term (current) drug therapy: Secondary | ICD-10-CM

## 2023-04-15 DIAGNOSIS — E785 Hyperlipidemia, unspecified: Secondary | ICD-10-CM | POA: Diagnosis not present

## 2023-04-15 NOTE — Patient Instructions (Signed)
[facility] at [facility] **VISIT SUMMARY & IMPORTANT INSTRUCTIONS **   You were seen today by Rojelio Brenner PA-C for your iron deficiency anemia.   Your blood levels have improved, but your iron levels We will schedule you for IV iron x 1 dose. Continue taking iron pill every day.  FOLLOW-UP APPOINTMENT: Recheck labs with follow-up visit in 4 months  ** Thank you for trusting me with your healthcare!  I strive to provide all of my patients with quality care at each visit.  If you receive a survey for this visit, I would be so grateful to you for taking the time to provide feedback.  Thank you in advance!  ~ Avary Eichenberger                   Dr. Doreatha Massed   &   Rojelio Brenner, PA-C   - - - - - - - - - - - - - - - - - -    Thank you for choosing [facility] at [facility] to provide your oncology and hematology care.  To afford each patient quality time with our provider, please arrive at least 15 minutes before your scheduled appointment time.   If you have a lab appointment with the Cancer Center please come in thru the Main Entrance and check in at the main information desk.  You need to re-schedule your appointment should you arrive 10 or more minutes late.  We strive to give you quality time with our providers, and arriving late affects you and other patients whose appointments are after yours.  Also, if you no show three or more times for appointments you may be dismissed from the clinic at the providers discretion.     Again, thank you for choosing [facility].  Our hope is that these requests will decrease the amount of time that you wait before being seen by our physicians.       _____________________________________________________________  Should you have questions after your visit to [facility], please contact our office at [phone] and follow the prompts.  Our office hours are 8:00 a.m. and  4:30 p.m. Monday - Friday.  Please note that voicemails left after 4:00 p.m. may not be returned until the following business day.  We are closed weekends and major holidays.  You do have access to a nurse 24-7, just call the main number to the clinic [phone] and do not press any options, hold on the line and a nurse will answer the phone.    For prescription refill requests, have your pharmacy contact our office and allow 72 hours.

## 2023-04-16 ENCOUNTER — Other Ambulatory Visit: Payer: Self-pay | Admitting: Cardiology

## 2023-04-16 ENCOUNTER — Ambulatory Visit: Payer: Medicare HMO | Attending: Cardiology | Admitting: Cardiology

## 2023-04-16 ENCOUNTER — Encounter: Payer: Self-pay | Admitting: Cardiology

## 2023-04-16 VITALS — BP 112/70 | HR 86 | Ht 63.0 in | Wt 272.2 lb

## 2023-04-16 DIAGNOSIS — I5032 Chronic diastolic (congestive) heart failure: Secondary | ICD-10-CM

## 2023-04-16 DIAGNOSIS — E1165 Type 2 diabetes mellitus with hyperglycemia: Secondary | ICD-10-CM | POA: Diagnosis not present

## 2023-04-16 DIAGNOSIS — E782 Mixed hyperlipidemia: Secondary | ICD-10-CM

## 2023-04-16 DIAGNOSIS — I10 Essential (primary) hypertension: Secondary | ICD-10-CM

## 2023-04-16 DIAGNOSIS — J449 Chronic obstructive pulmonary disease, unspecified: Secondary | ICD-10-CM | POA: Diagnosis not present

## 2023-04-16 DIAGNOSIS — I480 Paroxysmal atrial fibrillation: Secondary | ICD-10-CM

## 2023-04-16 DIAGNOSIS — E039 Hypothyroidism, unspecified: Secondary | ICD-10-CM | POA: Diagnosis not present

## 2023-04-16 DIAGNOSIS — I2510 Atherosclerotic heart disease of native coronary artery without angina pectoris: Secondary | ICD-10-CM

## 2023-04-16 DIAGNOSIS — E785 Hyperlipidemia, unspecified: Secondary | ICD-10-CM | POA: Diagnosis not present

## 2023-04-16 MED ORDER — FUROSEMIDE 40 MG PO TABS: 40.0000 mg | ORAL_TABLET | Freq: Two times a day (BID) | ORAL | 3 refills | Status: AC

## 2023-04-16 MED ORDER — APIXABAN 5 MG PO TABS: 5.0000 mg | ORAL_TABLET | Freq: Two times a day (BID) | ORAL | 3 refills | Status: AC

## 2023-04-16 MED ORDER — ATORVASTATIN CALCIUM 80 MG PO TABS: 80.0000 mg | ORAL_TABLET | Freq: Every day | ORAL | 3 refills | Status: DC

## 2023-04-16 MED ORDER — DILTIAZEM HCL ER COATED BEADS 120 MG PO CP24: 120.0000 mg | ORAL_CAPSULE | Freq: Every day | ORAL | 3 refills | Status: AC

## 2023-04-16 MED ORDER — BISOPROLOL FUMARATE 10 MG PO TABS: 10.0000 mg | ORAL_TABLET | Freq: Every day | ORAL | 3 refills | Status: DC
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
## 2023-04-16 NOTE — Telephone Encounter (Signed)
Pt last saw Dr Wyline Mood today 04/16/23, last labs 03/24/23 Creat 0.72, age 77, weight 123.5kg, based on specified criteria pt is on appropriate dosage of Eliquis 5mg  BID for afib.  Will refill rx.

## 2023-04-16 NOTE — Telephone Encounter (Signed)
Requesting refill was seen today 90 supply

## 2023-04-16 NOTE — Patient Instructions (Addendum)
Medication Instructions:  Your physician recommends that you continue on your current medications as directed. Please refer to the Current Medication list given to you today.   Labwork: None  Testing/Procedures: None  Follow-Up: Your physician recommends that you schedule a follow-up appointment in: 6 months  Any Other Special Instructions Will Be Listed Below (If Applicable). Thank you for choosing [facility]!     If you need a refill on your cardiac medications before your next appointment, please call your pharmacy.

## 2023-04-18 ENCOUNTER — Other Ambulatory Visit: Payer: Self-pay

## 2023-04-18 DIAGNOSIS — D509 Iron deficiency anemia, unspecified: Secondary | ICD-10-CM | POA: Diagnosis not present

## 2023-04-18 DIAGNOSIS — D500 Iron deficiency anemia secondary to blood loss (chronic): Secondary | ICD-10-CM

## 2023-04-18 LAB — OCCULT BLOOD X 1 CARD TO LAB, STOOL
Fecal Occult Bld: NEGATIVE
Fecal Occult Bld: POSITIVE — AB
Fecal Occult Bld: POSITIVE — AB

## 2023-04-22 NOTE — Progress Notes (Signed)
LM for patient to return call.

## 2023-04-23 NOTE — Progress Notes (Signed)
Patient made aware of results and recommended follow up with Dr. Marletta Lor.  Verbalized understanding.

## 2023-04-27 DIAGNOSIS — I4891 Unspecified atrial fibrillation: Secondary | ICD-10-CM | POA: Diagnosis not present

## 2023-04-27 DIAGNOSIS — I5033 Acute on chronic diastolic (congestive) heart failure: Secondary | ICD-10-CM | POA: Diagnosis not present

## 2023-04-27 DIAGNOSIS — J9601 Acute respiratory failure with hypoxia: Secondary | ICD-10-CM | POA: Diagnosis not present

## 2023-04-30 ENCOUNTER — Inpatient Hospital Stay: Payer: Medicare HMO | Attending: Hematology

## 2023-04-30 VITALS — BP 133/97 | HR 68 | Temp 97.0°F | Resp 20

## 2023-04-30 DIAGNOSIS — D509 Iron deficiency anemia, unspecified: Secondary | ICD-10-CM | POA: Diagnosis present

## 2023-04-30 DIAGNOSIS — D500 Iron deficiency anemia secondary to blood loss (chronic): Secondary | ICD-10-CM

## 2023-04-30 MED ORDER — FAMOTIDINE IN NACL 20-0.9 MG/50ML-% IV SOLN
20.0000 mg | Freq: Once | INTRAVENOUS | Status: AC
  Administered 2023-04-30: 20 mg via INTRAVENOUS
  Filled 2023-04-30: qty 50

## 2023-04-30 MED ORDER — ACETAMINOPHEN 325 MG PO TABS
650.0000 mg | ORAL_TABLET | Freq: Once | ORAL | Status: AC
  Administered 2023-04-30: 650 mg via ORAL
  Filled 2023-04-30: qty 2

## 2023-04-30 MED ORDER — CETIRIZINE HCL 10 MG PO TABS
10.0000 mg | ORAL_TABLET | Freq: Once | ORAL | Status: AC
  Administered 2023-04-30: 10 mg via ORAL
  Filled 2023-04-30: qty 1

## 2023-04-30 MED ORDER — SODIUM CHLORIDE 0.9 % IV SOLN: INTRAVENOUS | Status: DC

## 2023-04-30 MED ORDER — SODIUM CHLORIDE 0.9 % IV SOLN
400.0000 mg | Freq: Once | INTRAVENOUS | Status: AC
  Administered 2023-04-30: 400 mg via INTRAVENOUS
  Filled 2023-04-30: qty 400

## 2023-04-30 MED ORDER — SODIUM CHLORIDE 0.9% FLUSH: 10.0000 mL | Freq: Two times a day (BID) | INTRAVENOUS | Status: DC

## 2023-04-30 MED ORDER — METHYLPREDNISOLONE SODIUM SUCC 125 MG IJ SOLR
125.0000 mg | Freq: Once | INTRAMUSCULAR | Status: AC
  Administered 2023-04-30: 125 mg via INTRAVENOUS
  Filled 2023-04-30: qty 2

## 2023-04-30 NOTE — Progress Notes (Signed)
Patient tolerated iron infusion without any issues. Patient discharged VSS with husband to lobby via wheelchair.

## 2023-05-01 ENCOUNTER — Encounter: Payer: Self-pay | Admitting: Internal Medicine

## 2023-05-01 ENCOUNTER — Ambulatory Visit: Payer: Medicare HMO | Admitting: Internal Medicine

## 2023-05-01 VITALS — BP 142/77 | HR 76 | Temp 97.8°F | Ht 63.0 in | Wt 272.3 lb

## 2023-05-01 DIAGNOSIS — D509 Iron deficiency anemia, unspecified: Secondary | ICD-10-CM | POA: Diagnosis not present

## 2023-05-01 DIAGNOSIS — Z860101 Personal history of adenomatous and serrated colon polyps: Secondary | ICD-10-CM | POA: Diagnosis not present

## 2023-05-01 DIAGNOSIS — D369 Benign neoplasm, unspecified site: Secondary | ICD-10-CM

## 2023-05-01 DIAGNOSIS — K219 Gastro-esophageal reflux disease without esophagitis: Secondary | ICD-10-CM | POA: Diagnosis not present

## 2023-05-01 DIAGNOSIS — Z8719 Personal history of other diseases of the digestive system: Secondary | ICD-10-CM

## 2023-05-01 DIAGNOSIS — R195 Other fecal abnormalities: Secondary | ICD-10-CM

## 2023-05-01 NOTE — Patient Instructions (Signed)
Your most recent blood counts and iron levels look great.  They have normalized.  I think we can continue to monitor for now.  Continue on esomeprazole for your chronic reflux.  Follow-up in 4 to 6 months.  I hope you have a fantastic Christmas.  Dr. Marletta Lor
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
## 2023-05-22 ENCOUNTER — Ambulatory Visit (INDEPENDENT_AMBULATORY_CARE_PROVIDER_SITE_OTHER): Payer: Medicare HMO

## 2023-05-22 ENCOUNTER — Ambulatory Visit
Admission: EM | Admit: 2023-05-22 | Discharge: 2023-05-22 | Disposition: A | Discharge status: Home / Self Care | Payer: Medicare HMO | Attending: Nurse Practitioner | Admitting: Nurse Practitioner

## 2023-05-22 ENCOUNTER — Other Ambulatory Visit: Payer: Self-pay

## 2023-05-22 ENCOUNTER — Encounter: Payer: Self-pay | Admitting: Emergency Medicine

## 2023-05-22 DIAGNOSIS — R062 Wheezing: Secondary | ICD-10-CM | POA: Diagnosis not present

## 2023-05-22 DIAGNOSIS — R059 Cough, unspecified: Secondary | ICD-10-CM | POA: Diagnosis not present

## 2023-05-22 DIAGNOSIS — R053 Chronic cough: Secondary | ICD-10-CM

## 2023-05-22 DIAGNOSIS — J441 Chronic obstructive pulmonary disease with (acute) exacerbation: Secondary | ICD-10-CM

## 2023-05-22 DIAGNOSIS — J984 Other disorders of lung: Secondary | ICD-10-CM | POA: Diagnosis not present

## 2023-05-22 DIAGNOSIS — I700 Atherosclerosis of aorta: Secondary | ICD-10-CM | POA: Diagnosis not present

## 2023-05-22 MED ORDER — METHYLPREDNISOLONE SODIUM SUCC 125 MG IJ SOLR
60.0000 mg | Freq: Once | INTRAMUSCULAR | Status: AC
Start: 2023-05-22 — End: 2023-05-22
  Administered 2023-05-22: 60 mg via INTRAMUSCULAR

## 2023-05-22 MED ORDER — IPRATROPIUM-ALBUTEROL 0.5-2.5 (3) MG/3ML IN SOLN
3.0000 mL | Freq: Once | RESPIRATORY_TRACT | Status: AC
  Administered 2023-05-22: 3 mL via RESPIRATORY_TRACT

## 2023-05-22 MED ORDER — PREDNISONE 20 MG PO TABS: 40.0000 mg | ORAL_TABLET | Freq: Every day | ORAL | 0 refills | Status: AC

## 2023-05-22 MED ORDER — IPRATROPIUM BROMIDE 0.03 % NA SOLN: 2.0000 | Freq: Two times a day (BID) | NASAL | 12 refills | Status: DC

## 2023-05-22 MED ORDER — BENZONATATE 100 MG PO CAPS
100.0000 mg | ORAL_CAPSULE | Freq: Three times a day (TID) | ORAL | 0 refills | Status: AC
Start: 2023-05-22 — End: ?

## 2023-05-22 NOTE — Discharge Instructions (Addendum)
Chest x-ray is pending.  You will be contacted once the results are received. Take medication as prescribed.  Hold your regular dose of prednisone until you have completed the prescription provided today. May take over-the-counter Tylenol or ibuprofen as needed for pain, fever, general discomfort. Recommend the use of normal saline nasal spray for nasal congestion and runny nose. For your cough, would recommend sleeping elevated or in a recliner and using a humidifier at nighttime during sleep while symptoms persist. Go to the emergency department immediately if you experience worsening wheezing, shortness of breath, difficulty breathing, or other concerns. Follow-up with your primary care physician to discuss referral to pulmonology. Follow-up as needed.

## 2023-05-22 NOTE — ED Triage Notes (Signed)
Pt reports cough, runny nose, intermittent chills,body aches, low grade fevers x3 weeks. Was seen by pcp and was px abx with no change in symptoms.
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
## 2023-05-23 DIAGNOSIS — I509 Heart failure, unspecified: Secondary | ICD-10-CM | POA: Diagnosis not present

## 2023-05-23 DIAGNOSIS — E1165 Type 2 diabetes mellitus with hyperglycemia: Secondary | ICD-10-CM | POA: Diagnosis not present

## 2023-05-28 DIAGNOSIS — J9601 Acute respiratory failure with hypoxia: Secondary | ICD-10-CM | POA: Diagnosis not present

## 2023-05-28 DIAGNOSIS — I5033 Acute on chronic diastolic (congestive) heart failure: Secondary | ICD-10-CM | POA: Diagnosis not present

## 2023-05-28 DIAGNOSIS — I4891 Unspecified atrial fibrillation: Secondary | ICD-10-CM | POA: Diagnosis not present

## 2023-06-10 ENCOUNTER — Other Ambulatory Visit (HOSPITAL_COMMUNITY): Payer: Self-pay | Admitting: Podiatry

## 2023-06-10 DIAGNOSIS — I739 Peripheral vascular disease, unspecified: Secondary | ICD-10-CM

## 2023-06-10 DIAGNOSIS — I872 Venous insufficiency (chronic) (peripheral): Secondary | ICD-10-CM | POA: Diagnosis not present

## 2023-06-10 DIAGNOSIS — L603 Nail dystrophy: Secondary | ICD-10-CM | POA: Diagnosis not present

## 2023-06-10 DIAGNOSIS — E1142 Type 2 diabetes mellitus with diabetic polyneuropathy: Secondary | ICD-10-CM | POA: Diagnosis not present

## 2023-06-15 ENCOUNTER — Other Ambulatory Visit: Payer: Self-pay | Admitting: Nurse Practitioner

## 2023-06-16 ENCOUNTER — Ambulatory Visit (HOSPITAL_COMMUNITY)
Admission: RE | Admit: 2023-06-16 | Discharge: 2023-06-16 | Disposition: A | Discharge status: Home / Self Care | Payer: Medicare HMO | Source: Ambulatory Visit | Attending: Podiatry | Admitting: Podiatry

## 2023-06-16 DIAGNOSIS — I739 Peripheral vascular disease, unspecified: Secondary | ICD-10-CM | POA: Insufficient documentation

## 2023-06-19 DIAGNOSIS — E1165 Type 2 diabetes mellitus with hyperglycemia: Secondary | ICD-10-CM | POA: Diagnosis not present

## 2023-06-19 DIAGNOSIS — R2689 Other abnormalities of gait and mobility: Secondary | ICD-10-CM | POA: Diagnosis not present

## 2023-06-19 DIAGNOSIS — E782 Mixed hyperlipidemia: Secondary | ICD-10-CM | POA: Diagnosis not present

## 2023-06-19 DIAGNOSIS — W19XXXA Unspecified fall, initial encounter: Secondary | ICD-10-CM | POA: Diagnosis not present

## 2023-06-19 DIAGNOSIS — E039 Hypothyroidism, unspecified: Secondary | ICD-10-CM | POA: Diagnosis not present

## 2023-06-19 DIAGNOSIS — S51811A Laceration without foreign body of right forearm, initial encounter: Secondary | ICD-10-CM | POA: Diagnosis not present

## 2023-06-20 LAB — COMPREHENSIVE METABOLIC PANEL
ALT: 22 [IU]/L (ref 0–32)
AST: 23 [IU]/L (ref 0–40)
Albumin: 3.8 g/dL (ref 3.8–4.8)
Alkaline Phosphatase: 103 [IU]/L (ref 44–121)
BUN/Creatinine Ratio: 21 (ref 12–28)
BUN: 19 mg/dL (ref 8–27)
Bilirubin Total: 0.4 mg/dL (ref 0.0–1.2)
CO2: 25 mmol/L (ref 20–29)
Calcium: 9 mg/dL (ref 8.7–10.3)
Chloride: 102 mmol/L (ref 96–106)
Creatinine, Ser: 0.91 mg/dL (ref 0.57–1.00)
Globulin, Total: 2.3 g/dL (ref 1.5–4.5)
Glucose: 120 mg/dL — ABNORMAL HIGH (ref 70–99)
Potassium: 4 mmol/L (ref 3.5–5.2)
Sodium: 143 mmol/L (ref 134–144)
Total Protein: 6.1 g/dL (ref 6.0–8.5)
eGFR: 65 mL/min/{1.73_m2} (ref >59)

## 2023-06-20 LAB — LIPID PANEL
Chol/HDL Ratio: 4 {ratio} (ref 0.0–4.4)
Cholesterol, Total: 119 mg/dL (ref 100–199)
HDL: 30 mg/dL — ABNORMAL LOW (ref >39)
LDL Chol Calc (NIH): 42 mg/dL (ref 0–99)
Triglycerides: 311 mg/dL — ABNORMAL HIGH (ref 0–149)
VLDL Cholesterol Cal: 47 mg/dL — ABNORMAL HIGH (ref 5–40)

## 2023-06-20 LAB — T4, FREE: Free T4: 0.83 ng/dL (ref 0.82–1.77)

## 2023-06-20 LAB — VITAMIN B12: Vitamin B-12: 573 pg/mL (ref 232–1245)

## 2023-06-26 ENCOUNTER — Encounter: Payer: Self-pay | Admitting: "Endocrinology

## 2023-06-26 ENCOUNTER — Ambulatory Visit (INDEPENDENT_AMBULATORY_CARE_PROVIDER_SITE_OTHER): Payer: Medicare HMO | Admitting: "Endocrinology

## 2023-06-26 VITALS — BP 144/88 | HR 68

## 2023-06-26 DIAGNOSIS — E782 Mixed hyperlipidemia: Secondary | ICD-10-CM

## 2023-06-26 DIAGNOSIS — I10 Essential (primary) hypertension: Secondary | ICD-10-CM | POA: Diagnosis not present

## 2023-06-26 DIAGNOSIS — E039 Hypothyroidism, unspecified: Secondary | ICD-10-CM | POA: Diagnosis not present

## 2023-06-26 DIAGNOSIS — E1165 Type 2 diabetes mellitus with hyperglycemia: Secondary | ICD-10-CM | POA: Diagnosis not present

## 2023-06-26 DIAGNOSIS — E1142 Type 2 diabetes mellitus with diabetic polyneuropathy: Secondary | ICD-10-CM | POA: Diagnosis not present

## 2023-06-26 DIAGNOSIS — Z794 Long term (current) use of insulin: Secondary | ICD-10-CM | POA: Insufficient documentation

## 2023-06-26 DIAGNOSIS — E2740 Unspecified adrenocortical insufficiency: Secondary | ICD-10-CM | POA: Diagnosis not present

## 2023-06-26 LAB — POCT GLYCOSYLATED HEMOGLOBIN (HGB A1C): HbA1c, POC (controlled diabetic range): 8.6 % — AB (ref 0.0–7.0)

## 2023-06-26 MED ORDER — INSULIN LISPRO PROT & LISPRO (75-25 MIX) 100 UNIT/ML KWIKPEN: 50.0000 [IU] | PEN_INJECTOR | Freq: Two times a day (BID) | SUBCUTANEOUS | 1 refills | Status: DC

## 2023-06-26 NOTE — Patient Instructions (Signed)
Advice for Weight Management  -For most of [place] the best way to lose weight is by diet management. Generally speaking, diet management means consuming less calories intentionally which over time brings about progressive weight loss.  This can be achieved more effectively by avoiding ultra processed carbohydrates, processed meats, unhealthy fats.    It is critically important to know your numbers: how much calorie you are consuming and how much calorie you need. More importantly, our carbohydrates sources should be unprocessed naturally occurring  complex starch food items.  It is always important to balance nutrition also by  appropriate intake of proteins (mainly plant-based), healthy fats/oils, plenty of fruits and vegetables.   -The American College of Lifestyle Medicine (ACL M) recommends nutrition derived mostly from Whole Food, Plant Predominant Sources example an apple instead of applesauce or apple pie. Eat Plenty of vegetables, Mushrooms, fruits, Legumes, Whole Grains, Nuts, seeds in lieu of processed meats, processed snacks/pastries red meat, poultry, eggs.  Use only water or unsweetened tea for hydration.  The College also recommends the need to stay away from risky substances including alcohol, smoking; obtaining 7-9 hours of restorative sleep, at least 150 minutes of moderate intensity exercise weekly, importance of healthy social connections, and being mindful of stress and seek help when it is overwhelming.    -Sticking to a routine mealtime to eat 3 meals a day and avoiding unnecessary snacks is shown to have a big role in weight control. Under normal circumstances, the only time we burn stored energy is when we are hungry, so allow  some hunger to take place- hunger means no food between appropriate meal times, only water.  It is not advisable to starve.   -It is better to avoid simple carbohydrates including:  Cakes, Sweet Desserts, Ice Cream, Soda (diet and regular), Sweet Tea, Candies, Chips, Cookies, Store Bought Juices, Alcohol in Excess of  1-2 drinks a day, Lemonade,  Artificial Sweeteners, Doughnuts, Coffee Creamers, "Sugar-free" Products, etc, etc.  This is not a complete list...Marland Kitchen.    -Consulting with certified diabetes educators is proven to provide you with the most accurate and current information on diet.  Also, you may be  interested in discussing diet options/exchanges , we can schedule a visit with Norm Salt, RDN, CDE for individualized nutrition education.  -Exercise: If you are able: 30 -60 minutes a day ,4 days a week, or 150 minutes of moderate intensity exercise weekly.    The longer the better if tolerated.  Combine stretch, strength, and aerobic activities.  If you were told in the past that you have high risk for cardiovascular diseases, or if you are currently symptomatic, you may seek evaluation by your heart doctor prior to initiating moderate to intense exercise programs.                                  Additional Care Considerations for Diabetes/Prediabetes   -Diabetes  is a chronic disease.  The most important care consideration is regular follow-up with your diabetes care provider with the goal being avoiding or delaying its complications and to take advantage of advances in medications and technology.  If appropriate actions are taken early enough, type 2 diabetes can even be  reversed.  Seek information from the right source.  - Whole Food, Plant Predominant Nutrition is highly recommended: Eat Plenty of vegetables, Mushrooms, fruits, Legumes, Whole Grains, Nuts, seeds in lieu of processed meats, processed snacks/pastries red meat, poultry, eggs as recommended by [employer] of  Lifestyle Medicine (ACLM).  -Type 2 diabetes is known to coexist with other important comorbidities such as high blood pressure and high cholesterol.  It is critical to control not only the  diabetes but also the high blood pressure and high cholesterol to minimize and delay the risk of complications including coronary artery disease, stroke, amputations, blindness, etc.  The good news is that this diet recommendation for type 2 diabetes is also very helpful for managing high cholesterol and high blood blood pressure.  - Studies showed that people with diabetes will benefit from a class of medications known as ACE inhibitors and statins.  Unless there are specific reasons not to be on these medications, the standard of care is to consider getting one from these groups of medications at an optimal doses.  These medications are generally considered safe and proven to help protect the heart and the kidneys.    - People with diabetes are encouraged to initiate and maintain regular follow-up with eye doctors, foot doctors, dentists , and if necessary heart and kidney doctors.     - It is highly recommended that people with diabetes quit smoking or stay away from smoking, and get yearly  flu vaccine and pneumonia vaccine at least every 5 years.  See above for additional recommendations on exercise, sleep, stress management , and healthy social connections.
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
## 2023-06-28 ENCOUNTER — Emergency Department (HOSPITAL_COMMUNITY): Payer: Medicare HMO

## 2023-06-28 ENCOUNTER — Inpatient Hospital Stay (HOSPITAL_COMMUNITY)
Admission: EM | Admit: 2023-06-28 | Discharge: 2023-07-07 | DRG: 193 | Disposition: A | Discharge status: Home Health | Payer: Medicare HMO | Attending: Internal Medicine | Admitting: Internal Medicine

## 2023-06-28 ENCOUNTER — Encounter (HOSPITAL_COMMUNITY): Payer: Self-pay | Admitting: *Deleted

## 2023-06-28 ENCOUNTER — Other Ambulatory Visit: Payer: Self-pay

## 2023-06-28 DIAGNOSIS — E039 Hypothyroidism, unspecified: Secondary | ICD-10-CM | POA: Diagnosis present

## 2023-06-28 DIAGNOSIS — J440 Chronic obstructive pulmonary disease with acute lower respiratory infection: Secondary | ICD-10-CM | POA: Diagnosis present

## 2023-06-28 DIAGNOSIS — Z7901 Long term (current) use of anticoagulants: Secondary | ICD-10-CM | POA: Diagnosis not present

## 2023-06-28 DIAGNOSIS — Z66 Do not resuscitate: Secondary | ICD-10-CM | POA: Diagnosis present

## 2023-06-28 DIAGNOSIS — E2740 Unspecified adrenocortical insufficiency: Secondary | ICD-10-CM | POA: Diagnosis not present

## 2023-06-28 DIAGNOSIS — Z794 Long term (current) use of insulin: Secondary | ICD-10-CM

## 2023-06-28 DIAGNOSIS — I25118 Atherosclerotic heart disease of native coronary artery with other forms of angina pectoris: Secondary | ICD-10-CM | POA: Diagnosis not present

## 2023-06-28 DIAGNOSIS — I2510 Atherosclerotic heart disease of native coronary artery without angina pectoris: Secondary | ICD-10-CM

## 2023-06-28 DIAGNOSIS — I2489 Other forms of acute ischemic heart disease: Secondary | ICD-10-CM | POA: Diagnosis present

## 2023-06-28 DIAGNOSIS — R918 Other nonspecific abnormal finding of lung field: Secondary | ICD-10-CM | POA: Diagnosis not present

## 2023-06-28 DIAGNOSIS — Z860101 Personal history of adenomatous and serrated colon polyps: Secondary | ICD-10-CM

## 2023-06-28 DIAGNOSIS — Z87891 Personal history of nicotine dependence: Secondary | ICD-10-CM

## 2023-06-28 DIAGNOSIS — H409 Unspecified glaucoma: Secondary | ICD-10-CM | POA: Diagnosis present

## 2023-06-28 DIAGNOSIS — E119 Type 2 diabetes mellitus without complications: Secondary | ICD-10-CM

## 2023-06-28 DIAGNOSIS — Z7951 Long term (current) use of inhaled steroids: Secondary | ICD-10-CM

## 2023-06-28 DIAGNOSIS — R7989 Other specified abnormal findings of blood chemistry: Secondary | ICD-10-CM | POA: Diagnosis present

## 2023-06-28 DIAGNOSIS — Z801 Family history of malignant neoplasm of trachea, bronchus and lung: Secondary | ICD-10-CM

## 2023-06-28 DIAGNOSIS — Z888 Allergy status to other drugs, medicaments and biological substances status: Secondary | ICD-10-CM

## 2023-06-28 DIAGNOSIS — J189 Pneumonia, unspecified organism: Principal | ICD-10-CM

## 2023-06-28 DIAGNOSIS — J9621 Acute and chronic respiratory failure with hypoxia: Secondary | ICD-10-CM | POA: Diagnosis present

## 2023-06-28 DIAGNOSIS — I482 Chronic atrial fibrillation, unspecified: Secondary | ICD-10-CM | POA: Diagnosis present

## 2023-06-28 DIAGNOSIS — Z6841 Body Mass Index (BMI) 40.0 and over, adult: Secondary | ICD-10-CM | POA: Diagnosis not present

## 2023-06-28 DIAGNOSIS — Z86718 Personal history of other venous thrombosis and embolism: Secondary | ICD-10-CM | POA: Diagnosis not present

## 2023-06-28 DIAGNOSIS — J9811 Atelectasis: Secondary | ICD-10-CM | POA: Diagnosis not present

## 2023-06-28 DIAGNOSIS — I130 Hypertensive heart and chronic kidney disease with heart failure and stage 1 through stage 4 chronic kidney disease, or unspecified chronic kidney disease: Secondary | ICD-10-CM | POA: Diagnosis present

## 2023-06-28 DIAGNOSIS — R079 Chest pain, unspecified: Secondary | ICD-10-CM | POA: Diagnosis not present

## 2023-06-28 DIAGNOSIS — Z886 Allergy status to analgesic agent status: Secondary | ICD-10-CM

## 2023-06-28 DIAGNOSIS — R42 Dizziness and giddiness: Secondary | ICD-10-CM | POA: Diagnosis not present

## 2023-06-28 DIAGNOSIS — J441 Chronic obstructive pulmonary disease with (acute) exacerbation: Secondary | ICD-10-CM | POA: Diagnosis not present

## 2023-06-28 DIAGNOSIS — R0602 Shortness of breath: Secondary | ICD-10-CM | POA: Diagnosis not present

## 2023-06-28 DIAGNOSIS — J439 Emphysema, unspecified: Secondary | ICD-10-CM | POA: Diagnosis not present

## 2023-06-28 DIAGNOSIS — Z981 Arthrodesis status: Secondary | ICD-10-CM

## 2023-06-28 DIAGNOSIS — R519 Headache, unspecified: Secondary | ICD-10-CM | POA: Diagnosis not present

## 2023-06-28 DIAGNOSIS — Z8249 Family history of ischemic heart disease and other diseases of the circulatory system: Secondary | ICD-10-CM | POA: Diagnosis not present

## 2023-06-28 DIAGNOSIS — E1122 Type 2 diabetes mellitus with diabetic chronic kidney disease: Secondary | ICD-10-CM | POA: Diagnosis present

## 2023-06-28 DIAGNOSIS — Z79899 Other long term (current) drug therapy: Secondary | ICD-10-CM

## 2023-06-28 DIAGNOSIS — E1142 Type 2 diabetes mellitus with diabetic polyneuropathy: Secondary | ICD-10-CM | POA: Diagnosis not present

## 2023-06-28 DIAGNOSIS — I5032 Chronic diastolic (congestive) heart failure: Secondary | ICD-10-CM | POA: Diagnosis not present

## 2023-06-28 DIAGNOSIS — Z7952 Long term (current) use of systemic steroids: Secondary | ICD-10-CM | POA: Diagnosis not present

## 2023-06-28 DIAGNOSIS — J9601 Acute respiratory failure with hypoxia: Secondary | ICD-10-CM | POA: Diagnosis not present

## 2023-06-28 DIAGNOSIS — I4819 Other persistent atrial fibrillation: Secondary | ICD-10-CM | POA: Diagnosis present

## 2023-06-28 DIAGNOSIS — N183 Chronic kidney disease, stage 3 unspecified: Secondary | ICD-10-CM | POA: Diagnosis not present

## 2023-06-28 DIAGNOSIS — Z800 Family history of malignant neoplasm of digestive organs: Secondary | ICD-10-CM

## 2023-06-28 DIAGNOSIS — Z1152 Encounter for screening for COVID-19: Secondary | ICD-10-CM | POA: Diagnosis not present

## 2023-06-28 DIAGNOSIS — Z79890 Hormone replacement therapy (postmenopausal): Secondary | ICD-10-CM | POA: Diagnosis not present

## 2023-06-28 DIAGNOSIS — K219 Gastro-esophageal reflux disease without esophagitis: Secondary | ICD-10-CM | POA: Diagnosis present

## 2023-06-28 DIAGNOSIS — Z91048 Other nonmedicinal substance allergy status: Secondary | ICD-10-CM

## 2023-06-28 DIAGNOSIS — E785 Hyperlipidemia, unspecified: Secondary | ICD-10-CM | POA: Diagnosis not present

## 2023-06-28 DIAGNOSIS — Z7984 Long term (current) use of oral hypoglycemic drugs: Secondary | ICD-10-CM

## 2023-06-28 DIAGNOSIS — I4891 Unspecified atrial fibrillation: Secondary | ICD-10-CM | POA: Diagnosis not present

## 2023-06-28 DIAGNOSIS — E66813 Obesity, class 3: Secondary | ICD-10-CM | POA: Diagnosis present

## 2023-06-28 DIAGNOSIS — I5033 Acute on chronic diastolic (congestive) heart failure: Secondary | ICD-10-CM | POA: Diagnosis not present

## 2023-06-28 DIAGNOSIS — Z955 Presence of coronary angioplasty implant and graft: Secondary | ICD-10-CM

## 2023-06-28 LAB — CBC WITH DIFFERENTIAL/PLATELET
Abs Immature Granulocytes: 0.04 10*3/uL (ref 0.00–0.07)
Basophils Absolute: 0 10*3/uL (ref 0.0–0.1)
Basophils Relative: 0 %
Eosinophils Absolute: 0 10*3/uL (ref 0.0–0.5)
Eosinophils Relative: 0 %
HCT: 44.8 % (ref 36.0–46.0)
Hemoglobin: 14 g/dL (ref 12.0–15.0)
Immature Granulocytes: 0 %
Lymphocytes Relative: 9 %
Lymphs Abs: 1 10*3/uL (ref 0.7–4.0)
MCH: 29.9 pg (ref 26.0–34.0)
MCHC: 31.3 g/dL (ref 30.0–36.0)
MCV: 95.7 fL (ref 80.0–100.0)
Monocytes Absolute: 0.6 10*3/uL (ref 0.1–1.0)
Monocytes Relative: 5 %
Neutro Abs: 9.3 10*3/uL — ABNORMAL HIGH (ref 1.7–7.7)
Neutrophils Relative %: 86 %
Platelets: 197 10*3/uL (ref 150–400)
RBC: 4.68 MIL/uL (ref 3.87–5.11)
RDW: 16.1 % — ABNORMAL HIGH (ref 11.5–15.5)
WBC: 11 10*3/uL — ABNORMAL HIGH (ref 4.0–10.5)
nRBC: 0 % (ref 0.0–0.2)

## 2023-06-28 LAB — RESP PANEL BY RT-PCR (RSV, FLU A&B, COVID)  RVPGX2
Influenza A by PCR: NEGATIVE
Influenza B by PCR: NEGATIVE
Resp Syncytial Virus by PCR: NEGATIVE
SARS Coronavirus 2 by RT PCR: NEGATIVE

## 2023-06-28 LAB — BASIC METABOLIC PANEL
Anion gap: 10 (ref 5–15)
BUN: 18 mg/dL (ref 8–23)
CO2: 30 mmol/L (ref 22–32)
Calcium: 9.1 mg/dL (ref 8.9–10.3)
Chloride: 103 mmol/L (ref 98–111)
Creatinine, Ser: 0.97 mg/dL (ref 0.44–1.00)
GFR, Estimated: >60 mL/min (ref >60)
Glucose, Bld: 77 mg/dL (ref 70–99)
Potassium: 4.5 mmol/L (ref 3.5–5.1)
Sodium: 143 mmol/L (ref 135–145)

## 2023-06-28 LAB — TROPONIN I (HIGH SENSITIVITY)
Troponin I (High Sensitivity): 45 ng/L — ABNORMAL HIGH (ref <18)
Troponin I (High Sensitivity): 43 ng/L — ABNORMAL HIGH (ref <18)

## 2023-06-28 LAB — LACTIC ACID, PLASMA
Lactic Acid, Venous: 0.9 mmol/L (ref 0.5–1.9)
Lactic Acid, Venous: 1.3 mmol/L (ref 0.5–1.9)

## 2023-06-28 MED ORDER — IPRATROPIUM BROMIDE 0.02 % IN SOLN
RESPIRATORY_TRACT | Status: AC
  Administered 2023-06-28: 1 mg
  Filled 2023-06-28: qty 5

## 2023-06-28 MED ORDER — ALBUTEROL SULFATE (2.5 MG/3ML) 0.083% IN NEBU
INHALATION_SOLUTION | RESPIRATORY_TRACT | Status: AC
  Administered 2023-06-28: 5 mg
  Filled 2023-06-28: qty 12

## 2023-06-28 MED ORDER — VANCOMYCIN HCL 1500 MG/300ML IV SOLN
1500.0000 mg | Freq: Once | INTRAVENOUS | Status: AC
  Administered 2023-06-28: 1500 mg via INTRAVENOUS
  Filled 2023-06-28: qty 300

## 2023-06-28 MED ORDER — VANCOMYCIN HCL IN DEXTROSE 1-5 GM/200ML-% IV SOLN
1000.0000 mg | Freq: Once | INTRAVENOUS | Status: AC
  Administered 2023-06-28: 1000 mg via INTRAVENOUS
  Filled 2023-06-28: qty 200

## 2023-06-28 MED ORDER — ACETAMINOPHEN 325 MG PO TABS
650.0000 mg | ORAL_TABLET | Freq: Four times a day (QID) | ORAL | Status: DC | PRN
  Administered 2023-06-29: 650 mg via ORAL
  Filled 2023-06-28: qty 2

## 2023-06-28 MED ORDER — METHYLPREDNISOLONE SODIUM SUCC 125 MG IJ SOLR
125.0000 mg | Freq: Once | INTRAMUSCULAR | Status: AC
  Administered 2023-06-28: 125 mg via INTRAVENOUS
  Filled 2023-06-28: qty 2

## 2023-06-28 MED ORDER — PIPERACILLIN-TAZOBACTAM 3.375 G IVPB 30 MIN
3.3750 g | Freq: Once | INTRAVENOUS | Status: AC
  Administered 2023-06-28: 3.375 g via INTRAVENOUS
  Filled 2023-06-28: qty 50

## 2023-06-28 MED ORDER — PIPERACILLIN-TAZOBACTAM 3.375 G IVPB
3.3750 g | Freq: Three times a day (TID) | INTRAVENOUS | Status: AC
  Administered 2023-06-29 – 2023-07-04 (×17): 3.375 g via INTRAVENOUS
  Filled 2023-06-28 (×17): qty 50

## 2023-06-28 MED ORDER — ONDANSETRON HCL 4 MG/2ML IJ SOLN
4.0000 mg | Freq: Four times a day (QID) | INTRAMUSCULAR | Status: DC | PRN
  Administered 2023-06-30 – 2023-07-04 (×5): 4 mg via INTRAVENOUS
  Filled 2023-06-28 (×5): qty 2

## 2023-06-28 MED ORDER — ALBUTEROL SULFATE (2.5 MG/3ML) 0.083% IN NEBU
10.0000 mg | INHALATION_SOLUTION | RESPIRATORY_TRACT | Status: AC
  Administered 2023-06-28: 5 mg via RESPIRATORY_TRACT

## 2023-06-28 MED ORDER — DM-GUAIFENESIN ER 30-600 MG PO TB12
1.0000 | ORAL_TABLET | Freq: Two times a day (BID) | ORAL | Status: DC
  Administered 2023-06-28 – 2023-07-07 (×18): 1 via ORAL
  Filled 2023-06-28 (×18): qty 1

## 2023-06-28 MED ORDER — VANCOMYCIN HCL 1250 MG/250ML IV SOLN: 1250.0000 mg | INTRAVENOUS | Status: DC
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
## 2023-06-28 NOTE — ED Triage Notes (Signed)
Pt with cough x 1 week. Pt with hx of COPD, on home O2 on 2 L/M, sats 88%.

## 2023-06-28 NOTE — ED Provider Notes (Signed)
[place] EMERGENCY DEPARTMENT AT [facility] Provider Note   CSN: [id number] Arrival date & time: 06/28/23  1627     History  Chief Complaint  Patient presents with   Cough    Ann [name] is a 78 y.o. female.   Cough This patient is a [age]-year-old female with a known history of chronic oxygen use on 2 L by nasal cannula chronically.  She has reactive airway disease of her lungs, she is on Eliquis because of atrial fibrillation and has hypertension.  She presents to the hospital with a complaint of shortness of breath which has been worsening over the last few days, she has had some increasing coughing and is now becoming severely dyspneic on exertion walking only 1 or 2 steps before she has to stop and take a breath.  She denies vomiting or diarrhea but has had increasing shortness of breath and is feeling like her 2 L is not enough.  On arrival she is hypoxic to 80% on her 2 L     Home Medications Prior to Admission medications   Medication Sig Start Date End Date Taking? Authorizing Provider  acetaminophen (TYLENOL) 325 MG tablet Take 2 tablets (650 mg total) by mouth every 6 (six) hours as needed for mild pain, moderate pain or fever. 12/11/19   Shon Hale, MD  albuterol (VENTOLIN HFA) 108 (90 Base) MCG/ACT inhaler Inhale 2 puffs into the lungs every 4 (four) hours as needed for wheezing. 05/31/20   Angiulli, Mcarthur Rossetti, PA-C  apixaban (ELIQUIS) 5 MG TABS tablet Take 1 tablet (5 mg total) by mouth 2 (two) times daily. 04/16/23 04/10/24  Antoine Poche, MD  atorvastatin (LIPITOR) 80 MG tablet TAKE 1 TABLET(80 MG) BY MOUTH DAILY AT 6 PM 06/16/23   Antoine Poche, MD  augmented betamethasone dipropionate (DIPROLENE-AF) 0.05 % cream Apply 1 Application topically daily. 12/31/22   [provider]  benzonatate (TESSALON) 100 MG capsule Take 1 capsule (100 mg total) by mouth every 8 (eight) hours. 05/22/23   Leath-Warren, Sadie Haber, NP  bisoprolol  (ZEBETA) 10 MG tablet Take 1 tablet (10 mg total) by mouth daily. 04/16/23   Antoine Poche, MD  budesonide (PULMICORT) 0.25 MG/2ML nebulizer solution One vial first thing in am and with evening dose of duoneb Patient taking differently: Take 0.25 mg by nebulization in the morning and at bedtime. 03/10/23   Nyoka Cowden, MD  Carboxymethylcellulose Sod PF 0.5 % SOLN Place 2 drops into both eyes daily as needed (for dry eye relief).     [provider]  Cholecalciferol (VITAMIN D3) 125 MCG (5000 UT) CAPS Take 1 capsule (5,000 Units total) by mouth daily. 05/31/20   Angiulli, Mcarthur Rossetti, PA-C  cyanocobalamin 1000 MCG tablet Take 1 tablet (1,000 mcg total) by mouth daily. Patient taking differently: Take 2,500 mcg by mouth daily. 05/31/20   Angiulli, Mcarthur Rossetti, PA-C  diltiazem (CARDIZEM CD) 120 MG 24 hr capsule Take 1 capsule (120 mg total) by mouth daily. 04/16/23   Antoine Poche, MD  doxycycline (VIBRA-TABS) 100 MG tablet Take 100 mg by mouth 2 (two) times daily. 04/03/23   [provider]  esomeprazole (NEXIUM) 40 MG capsule Take 1 capsule (40 mg total) by mouth 2 (two) times daily before a meal. 01/30/23 01/30/24  Lanelle Bal, DO  furosemide (LASIX) 40 MG tablet Take 1 tablet (40 mg total) by mouth 2 (two) times daily. 04/16/23   Antoine Poche, MD  gabapentin (NEURONTIN) 400 MG capsule Take 1 capsule (400 mg total) by mouth 3 (three) times daily. 05/31/20   Angiulli, Mcarthur Rossetti, PA-C  GLIPIZIDE XL 5 MG 24 hr tablet TAKE ONE TABLET BY MOUTH EVERY DAY WITH BREAKFAST Patient taking differently: Take 5 mg by mouth daily with breakfast. 12/04/22   Nida, Denman George, MD  guaiFENesin (MUCINEX) 600 MG 12 hr tablet Take 1 tablet (600 mg total) by mouth 2 (two) times daily as needed. Patient not taking: Reported on 05/01/2023 12/25/22   Particia Nearing, PA-C  hydrocortisone valerate cream (WESTCORT) 0.2 % Apply topically 2 (two) times daily. Apply to sparingly to both legs for  treatment of stasis dermatitis. 03/19/23   Vassie Loll, MD  Insulin Lispro Prot & Lispro (HUMALOG 75/25 MIX) (75-25) 100 UNIT/ML Kwikpen Inject 50-60 Units into the skin 2 (two) times daily before a meal. 60 units with breakfast, 50 units with supper 06/26/23   Roma Kayser, MD  ipratropium (ATROVENT) 0.03 % nasal spray Place 2 sprays into both nostrils every 12 (twelve) hours. 05/22/23   Leath-Warren, Sadie Haber, NP  ipratropium-albuterol (DUONEB) 0.5-2.5 (3) MG/3ML SOLN Take 3 mLs by nebulization 4 (four) times daily. 03/10/23   Nyoka Cowden, MD  Iron, Ferrous Sulfate, 325 (65 Fe) MG TABS Take 325 mg by mouth daily. 10/24/22   Del Nigel Berthold, FNP  levothyroxine (SYNTHROID) 175 MCG tablet Take 1 tablet (175 mcg total) by mouth every morning. 11/16/20   Nida, Denman George, MD  Menthol-Methyl Salicylate (SALONPAS PAIN RELIEF PATCH) PTCH Apply 1 patch topically daily as needed (pain).    [provider]  nitroGLYCERIN (NITROSTAT) 0.4 MG SL tablet Place 0.4 mg under the tongue every 5 (five) minutes x 3 doses as needed for chest pain (if no relief after 2nd dose, proceed to ED or call 911).    [provider]  nystatin (MYCOSTATIN/NYSTOP) powder Apply topically 3 (three) times daily. Apply to to affected areas (groin/skin folds) 12/06/22   Azucena Fallen, MD  TRELEGY ELLIPTA 100-62.5-25 MCG/ACT AEPB Inhale 1 puff into the lungs daily. 04/15/23   [provider]  triamcinolone cream (KENALOG) 0.1 % Apply 1 Application topically 2 (two) times daily. Patient taking differently: Apply 1 Application topically 2 (two) times daily as needed (for skin rash/irritation). 11/07/22   Vu, Tonita Phoenix, MD  Vibegron (GEMTESA) 75 MG TABS Take 1 tablet (75 mg total) by mouth daily. 08/19/22   McKenzie, Mardene Celeste, MD      Allergies    Ace inhibitors, Jonne Ply [aspirin], Breztri aerosphere [budeson-glycopyrrol-formoterol], Tape, Niacin, and Reglan [metoclopramide]    Review  of Systems   Review of Systems  Respiratory:  Positive for cough.   All other systems reviewed and are negative.   Physical Exam Updated Vital Signs BP 104/87   Pulse 89   Temp 98.4 F (36.9 C) (Oral)   Resp 14   Ht 1.6 m (5\' 3" )   Wt 125.2 kg   SpO2 94%   BMI 48.89 kg/m  Physical Exam Vitals and nursing note reviewed.  Constitutional:      General: She is in acute distress.     Appearance: She is well-developed. She is ill-appearing.  HENT:     Head: Normocephalic and atraumatic.     Mouth/Throat:     Pharynx: No oropharyngeal exudate.  Eyes:     General: No scleral icterus.       Right eye: No discharge.  Left eye: No discharge.     Conjunctiva/sclera: Conjunctivae normal.     Pupils: Pupils are equal, round, and reactive to light.  Neck:     Thyroid: No thyromegaly.     Vascular: No JVD.  Cardiovascular:     Rate and Rhythm: Tachycardia present. Rhythm irregular.     Heart sounds: Normal heart sounds. No murmur heard.    No friction rub. No gallop.  Pulmonary:     Effort: Respiratory distress present.     Breath sounds: Wheezing, rhonchi and rales present.  Abdominal:     General: Bowel sounds are normal. There is no distension.     Palpations: Abdomen is soft. There is no mass.     Tenderness: There is no abdominal tenderness.  Musculoskeletal:        General: No tenderness. Normal range of motion.     Cervical back: Normal range of motion and neck supple.     Right lower leg: Edema present.     Left lower leg: Edema present.     Comments: Minimal bilateral edema  Lymphadenopathy:     Cervical: No cervical adenopathy.  Skin:    General: Skin is warm and dry.     Findings: No erythema or rash.  Neurological:     Mental Status: She is alert.     Coordination: Coordination normal.  Psychiatric:        Behavior: Behavior normal.     ED Results / Procedures / Treatments   Labs (all labs ordered are listed, but only abnormal results are  displayed) Labs Reviewed  CBC WITH DIFFERENTIAL/PLATELET - Abnormal; Notable for the following components:      Result Value   WBC 11.0 (*)    RDW 16.1 (*)    Neutro Abs 9.3 (*)    All other components within normal limits  TROPONIN I (HIGH SENSITIVITY) - Abnormal; Notable for the following components:   Troponin I (High Sensitivity) 45 (*)    All other components within normal limits  RESP PANEL BY RT-PCR (RSV, FLU A&B, COVID)  RVPGX2  CULTURE, BLOOD (ROUTINE X 2)  CULTURE, BLOOD (ROUTINE X 2)  BASIC METABOLIC PANEL  LACTIC ACID, PLASMA  LACTIC ACID, PLASMA  TROPONIN I (HIGH SENSITIVITY)    EKG EKG Interpretation Date/Time:  Saturday June 28 2023 17:06:22 EST Ventricular Rate:  92 PR Interval:    QRS Duration:  62 QT Interval:  352 QTC Calculation: 435 R Axis:   40  Text Interpretation: Atrial fibrillation Low voltage QRS ST & T wave abnormality, consider inferolateral ischemia Abnormal ECG When compared with ECG of 21-Mar-2023 12:29, PREVIOUS ECG IS PRESENT Confirmed by Eber Hong ([id number]) on 06/28/2023 5:08:46 PM  Radiology DG Chest 2 View Result Date: 06/28/2023 CLINICAL DATA:  Cough, shortness of breath and chest pain for 1 week. EXAM: CHEST - 2 VIEW COMPARISON:  05/22/2023 FINDINGS: Exam detail is diminished secondary to patient's body habitus and suboptimal inspiration. Cardiac enlargement and aortic atherosclerosis. Enlargement of the pulmonary arteries compatible with PA hypertension. Opacities are noted within the periphery of the left midlung and both lung bases. Chronic coarsened interstitial markings noted bilaterally compatible with COPD/emphysema. Previous ACDF within the lower cervical spine. IMPRESSION: 1. Opacities within the periphery of the left midlung and both lung bases. Cannot exclude multifocal pneumonia. 2. COPD/emphysema. 3. Enlargement of the pulmonary arteries compatible with PA hypertension. Electronically Signed   By: Signa Kell M.D.   On:  06/28/2023 17:47    Procedures .  Critical Care  Performed by: Eber Hong, MD Authorized by: Eber Hong, MD   Critical care provider statement:    Critical care time (minutes):  30   Critical care time was exclusive of:  Teaching time and separately billable procedures and treating other patients   Critical care was necessary to treat or prevent imminent or life-threatening deterioration of the following conditions:  Respiratory failure and sepsis   Critical care was time spent personally by me on the following activities:  Development of treatment plan with patient or surrogate, discussions with consultants, evaluation of patient's response to treatment, examination of patient, ordering and review of laboratory studies, ordering and review of radiographic studies, ordering and performing treatments and interventions, pulse oximetry, re-evaluation of patient's condition, review of old charts and obtaining history from patient or surrogate   I assumed direction of critical care for this patient from another provider in my specialty: no     Care discussed with: admitting provider   Comments:           Medications Ordered in ED Medications  vancomycin (VANCOCIN) IVPB 1000 mg/200 mL premix (has no administration in time range)  piperacillin-tazobactam (ZOSYN) IVPB 3.375 g (has no administration in time range)  albuterol (PROVENTIL,VENTOLIN) solution continuous neb (has no administration in time range)  methylPREDNISolone sodium succinate (SOLU-MEDROL) 125 mg/2 mL injection 125 mg (has no administration in time range)    ED Course/ Medical Decision Making/ A&P                                 Medical Decision Making Amount and/or Complexity of Data Reviewed Labs: ordered. Radiology: ordered.   This is an ill-appearing patient with acute hypoxic respiratory failure.  She is now on 4 L at 90% and tachypneic.  She does appear to have multifocal pneumonia, she has a slight  leukocytosis, otherwise the metabolic panel was unremarkable.  Her troponin was 45, this is not necessarily higher than prior values.   This patient presents to the ED for concern of shortness of breath and hypoxic respiratory failure, this involves an extensive number of treatment options, and is a complaint that carries with it a high risk of complications and morbidity.  The differential diagnosis includes chronic acute hypoxic respiratory failure, sepsis, pneumothorax, pneumonia   Co morbidities that complicate the patient evaluation  Atrial fibrillation   Additional history obtained:  Additional history obtained from medical record External records from outside source obtained and reviewed including prior admissions   Lab Tests:  I Ordered, and personally interpreted labs.  The pertinent results include: Leukocytosis, negative for COVID and flu   Imaging Studies ordered:  I ordered imaging studies including chest x-ray I independently visualized and interpreted imaging which showed multifocal pneumonia I agree with the radiologist interpretation   Cardiac Monitoring: / EKG:  The patient was maintained on a cardiac monitor.  I personally viewed and interpreted the cardiac monitored which showed an underlying rhythm of: A-fib with mild RVR   Consultations Obtained:  I requested consultation with the hospitalist,  and discussed lab and imaging findings as well as pertinent plan - they recommend: Admission   Problem List / ED Course / Critical interventions / Medication management  Hypoxic respiratory failure treated with broad-spectrum antibiotics, admission to the hospital with continuous nebulizer therapy, Solu-Medrol therapy, antibiotics. I ordered medication including medications and antibiotics for respiratory failure Reevaluation of the patient after these medicines  showed that the patient critically ill I have reviewed the patients home medicines and have made  adjustments as needed   Social Determinants of Health:  Chronic lung disease   Test / Admission - Considered:  Admit to higher level of care         Final Clinical Impression(s) / ED Diagnoses Final diagnoses:  Multifocal pneumonia  Acute on chronic respiratory failure with hypoxia [facility])    Rx / DC Orders ED Discharge Orders     None         Eber Hong, MD 06/28/23 1947

## 2023-06-28 NOTE — Progress Notes (Signed)
Pharmacy Antibiotic Note  Ann [name] is a 78 y.o. female admitted on 06/28/2023 with pneumonia and aspiration pneumonia .  Patient reports shortness of breath that has worsened over the last few days as well as increasing cough. Now is dyspneic on exertion after 1 or 2 steps. She is on 2 L O2 at baseline but feels that is not enough. Pharmacy has been consulted for vancomycin and Zosyn dosing.  S/p Vanc 1 g IV x 1, Zosyn 3.375 g IV x 1  WBC 11 Lactic acid 0.9 Scr 0.97 (baseline) Afebrile  Imaging 2/1 CXR: Opacities within the periphery of the left midlung and both lung bases. Cannot exclude multifocal pneumonia.   Plan: Give vancomycin 1500 mg IV x1 to complete a loading dose of 2.5 g IV x 1 followed by 1250 mg IV Q24H. Goal AUC 400-550. Expected AUC: 529 Expected Css min: 14.3 SCr used: 0.97  Weight used: IBW, Vd used: 0.5 (BMI 48.89) Start Zosyn 3.375 g IV Q8H Continue to monitor renal function and follow culture results   Height: 5\' 3"  (160 cm) Weight: 125.2 kg (276 lb) IBW/kg (Calculated) : 52.4  Temp (24hrs), Avg:98.4 F (36.9 C), Min:98.4 F (36.9 C), Max:98.4 F (36.9 C)  Recent Labs  Lab 06/28/23 1725 06/28/23 2010  WBC 11.0*  --   CREATININE 0.97  --   LATICACIDVEN  --  0.9    Estimated Creatinine Clearance: 62.5 mL/min (by C-G formula based on SCr of 0.97 mg/dL).    Allergies  Allergen Reactions   Ace Inhibitors Other (See Comments)    Reaction unknown   Asa [Aspirin] Other (See Comments)    Causes bleeding   Breztri Aerosphere [Budeson-Glycopyrrol-Formoterol]    Tape Other (See Comments)    Skin tearing, causes scars  Other Reaction(s): Other (See Comments)   Niacin Rash   Reglan [Metoclopramide] Anxiety    Antimicrobials this admission: 2/1 Vanc >>  2/1 Zosyn >>   Microbiology results: 2/1 BCx: IP   Thank you for allowing pharmacy to be a part of this patient's care.  Merryl Hacker, PharmD Clinical Pharmacist  06/28/2023 9:40  PM

## 2023-06-28 NOTE — ED Provider Triage Note (Signed)
Emergency Medicine Provider Triage Evaluation Note  LAMAE FOSCO , a 78 y.o. female  was evaluated in triage.  Pt complains of productive cough for the past week and shortness of breath today.  Coughing up brown sputum.  Wears 2 L O2 nasal cannula chronically, today felt more short of breath and saturation was 88%, was bumped to 4 L.  Has not noticed any swelling..  Review of Systems  Positive: Cough, shortness of breath Negative: Fever, chest pain, swelling  Physical Exam  BP 106/88 (BP Location: Left Arm)   Pulse 84   Temp 98.4 F (36.9 C) (Oral)   Resp (!) 21   Ht 5\' 3"  (1.6 m)   Wt 125.2 kg   SpO2 90%   BMI 48.89 kg/m  Gen:   Awake, no distress   Resp:  Normal effort  MSK:   Moves extremities without difficulty  Other:  Wearing 4L Aspers  Medical Decision Making  Medically screening exam initiated at 5:29 PM.  Appropriate orders placed.  GLENDORA CLOUATRE was informed that the remainder of the evaluation will be completed by another provider, this initial triage assessment does not replace that evaluation, and the importance of remaining in the ED until their evaluation is complete.  Workup initiated   Isobella Ascher T, PA-C 06/28/23 1729

## 2023-06-29 DIAGNOSIS — I25118 Atherosclerotic heart disease of native coronary artery with other forms of angina pectoris: Secondary | ICD-10-CM

## 2023-06-29 DIAGNOSIS — I482 Chronic atrial fibrillation, unspecified: Secondary | ICD-10-CM | POA: Diagnosis not present

## 2023-06-29 DIAGNOSIS — R7989 Other specified abnormal findings of blood chemistry: Secondary | ICD-10-CM | POA: Diagnosis not present

## 2023-06-29 DIAGNOSIS — J189 Pneumonia, unspecified organism: Secondary | ICD-10-CM | POA: Diagnosis not present

## 2023-06-29 DIAGNOSIS — I5032 Chronic diastolic (congestive) heart failure: Secondary | ICD-10-CM | POA: Diagnosis not present

## 2023-06-29 LAB — COMPREHENSIVE METABOLIC PANEL
ALT: 17 U/L (ref 0–44)
AST: 23 U/L (ref 15–41)
Albumin: 3.2 g/dL — ABNORMAL LOW (ref 3.5–5.0)
Alkaline Phosphatase: 59 U/L (ref 38–126)
Anion gap: 12 (ref 5–15)
BUN: 22 mg/dL (ref 8–23)
CO2: 27 mmol/L (ref 22–32)
Calcium: 8.9 mg/dL (ref 8.9–10.3)
Chloride: 101 mmol/L (ref 98–111)
Creatinine, Ser: 0.87 mg/dL (ref 0.44–1.00)
GFR, Estimated: >60 mL/min (ref >60)
Glucose, Bld: 190 mg/dL — ABNORMAL HIGH (ref 70–99)
Potassium: 4.5 mmol/L (ref 3.5–5.1)
Sodium: 140 mmol/L (ref 135–145)
Total Bilirubin: 0.9 mg/dL (ref 0.0–1.2)
Total Protein: 6.9 g/dL (ref 6.5–8.1)

## 2023-06-29 LAB — MAGNESIUM: Magnesium: 2 mg/dL (ref 1.7–2.4)

## 2023-06-29 LAB — CBC
HCT: 45 % (ref 36.0–46.0)
Hemoglobin: 13.5 g/dL (ref 12.0–15.0)
MCH: 28.9 pg (ref 26.0–34.0)
MCHC: 30 g/dL (ref 30.0–36.0)
MCV: 96.4 fL (ref 80.0–100.0)
Platelets: 176 10*3/uL (ref 150–400)
RBC: 4.67 MIL/uL (ref 3.87–5.11)
RDW: 15.9 % — ABNORMAL HIGH (ref 11.5–15.5)
WBC: 8.4 10*3/uL (ref 4.0–10.5)
nRBC: 0 % (ref 0.0–0.2)

## 2023-06-29 LAB — GLUCOSE, CAPILLARY
Glucose-Capillary: 224 mg/dL — ABNORMAL HIGH (ref 70–99)
Glucose-Capillary: 234 mg/dL — ABNORMAL HIGH (ref 70–99)
Glucose-Capillary: 188 mg/dL — ABNORMAL HIGH (ref 70–99)
Glucose-Capillary: 179 mg/dL — ABNORMAL HIGH (ref 70–99)

## 2023-06-29 LAB — HEMOGLOBIN A1C
Hgb A1c MFr Bld: 9 % — ABNORMAL HIGH (ref 4.8–5.6)
Mean Plasma Glucose: 211.6 mg/dL

## 2023-06-29 LAB — PHOSPHORUS: Phosphorus: 4.2 mg/dL (ref 2.5–4.6)

## 2023-06-29 LAB — MRSA NEXT GEN BY PCR, NASAL: MRSA by PCR Next Gen: NOT DETECTED

## 2023-06-29 LAB — PROCALCITONIN: Procalcitonin: 0.4 ng/mL

## 2023-06-29 MED ORDER — DILTIAZEM HCL ER COATED BEADS 120 MG PO CP24
120.0000 mg | ORAL_CAPSULE | Freq: Every day | ORAL | Status: DC
  Administered 2023-06-29 – 2023-07-07 (×9): 120 mg via ORAL
  Filled 2023-06-29 (×9): qty 1

## 2023-06-29 MED ORDER — BISOPROLOL FUMARATE 5 MG PO TABS
10.0000 mg | ORAL_TABLET | Freq: Every day | ORAL | Status: DC
Start: 2023-06-29 — End: 2023-07-02
  Administered 2023-06-29 – 2023-07-01 (×3): 10 mg via ORAL
  Filled 2023-06-29 (×3): qty 2

## 2023-06-29 MED ORDER — INSULIN ASPART PROT & ASPART (70-30 MIX) 100 UNIT/ML ~~LOC~~ SUSP
30.0000 [IU] | Freq: Two times a day (BID) | SUBCUTANEOUS | Status: DC
  Administered 2023-06-29 – 2023-06-30 (×3): 30 [IU] via SUBCUTANEOUS
  Filled 2023-06-29: qty 10

## 2023-06-29 MED ORDER — IPRATROPIUM-ALBUTEROL 0.5-2.5 (3) MG/3ML IN SOLN
3.0000 mL | RESPIRATORY_TRACT | Status: DC
  Administered 2023-06-29 (×4): 3 mL via RESPIRATORY_TRACT
  Filled 2023-06-29 (×4): qty 3

## 2023-06-29 MED ORDER — ACETAMINOPHEN 325 MG PO TABS
650.0000 mg | ORAL_TABLET | Freq: Four times a day (QID) | ORAL | Status: DC | PRN
  Administered 2023-06-30 – 2023-07-04 (×4): 650 mg via ORAL
  Filled 2023-06-29 (×5): qty 2

## 2023-06-29 MED ORDER — LEVOTHYROXINE SODIUM 75 MCG PO TABS
175.0000 ug | ORAL_TABLET | Freq: Every morning | ORAL | Status: DC
  Administered 2023-06-29 – 2023-07-07 (×9): 175 ug via ORAL
  Filled 2023-06-29 (×9): qty 1

## 2023-06-29 MED ORDER — LEVALBUTEROL HCL 0.63 MG/3ML IN NEBU
0.6300 mg | INHALATION_SOLUTION | Freq: Four times a day (QID) | RESPIRATORY_TRACT | Status: DC
  Administered 2023-06-30: 0.63 mg via RESPIRATORY_TRACT
  Filled 2023-06-29: qty 3

## 2023-06-29 MED ORDER — GUAIFENESIN-DM 100-10 MG/5ML PO SYRP
5.0000 mL | ORAL_SOLUTION | ORAL | Status: DC | PRN
  Administered 2023-06-29 – 2023-07-06 (×8): 5 mL via ORAL
  Filled 2023-06-29 (×8): qty 5

## 2023-06-29 MED ORDER — INSULIN ASPART 100 UNIT/ML IJ SOLN
0.0000 [IU] | Freq: Three times a day (TID) | INTRAMUSCULAR | Status: DC
Start: 2023-06-29 — End: 2023-07-07
  Administered 2023-06-29 (×3): 5 [IU] via SUBCUTANEOUS
  Administered 2023-06-30: 2 [IU] via SUBCUTANEOUS
  Administered 2023-06-30: 3 [IU] via SUBCUTANEOUS
  Administered 2023-07-01: 2 [IU] via SUBCUTANEOUS
  Administered 2023-07-03: 5 [IU] via SUBCUTANEOUS
  Administered 2023-07-03: 3 [IU] via SUBCUTANEOUS
  Administered 2023-07-03: 5 [IU] via SUBCUTANEOUS
  Administered 2023-07-04: 3 [IU] via SUBCUTANEOUS
  Administered 2023-07-04 – 2023-07-05 (×3): 8 [IU] via SUBCUTANEOUS
  Administered 2023-07-05: 5 [IU] via SUBCUTANEOUS
  Administered 2023-07-05: 15 [IU] via SUBCUTANEOUS
  Administered 2023-07-06: 11 [IU] via SUBCUTANEOUS
  Administered 2023-07-06: 8 [IU] via SUBCUTANEOUS
  Administered 2023-07-07: 3 [IU] via SUBCUTANEOUS

## 2023-06-29 MED ORDER — CHLORHEXIDINE GLUCONATE CLOTH 2 % EX PADS
6.0000 | MEDICATED_PAD | Freq: Every day | CUTANEOUS | Status: DC
  Administered 2023-06-29: 6 via TOPICAL

## 2023-06-29 MED ORDER — HYDROCODONE-ACETAMINOPHEN 5-325 MG PO TABS
1.0000 | ORAL_TABLET | Freq: Four times a day (QID) | ORAL | Status: DC | PRN
  Administered 2023-06-29 – 2023-07-01 (×5): 1 via ORAL
  Filled 2023-06-29 (×5): qty 1

## 2023-06-29 MED ORDER — ATORVASTATIN CALCIUM 40 MG PO TABS
80.0000 mg | ORAL_TABLET | Freq: Every day | ORAL | Status: DC
  Administered 2023-06-29 – 2023-07-07 (×9): 80 mg via ORAL
  Filled 2023-06-29 (×9): qty 2
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
## 2023-06-29 NOTE — Progress Notes (Signed)
Transition of Care Department [facility]) has reviewed patient and no other TOC needs have been identified at this time. We will continue to monitor patient advancement through interdisciplinary progression rounds. If new patient transition needs arise, please place a TOC consult.    06/29/23 1148  TOC Brief Assessment  Insurance and Status Reviewed  Patient has primary care physician Yes  Home environment has been reviewed Lives with husband, 2 sons and their significant others  Prior level of function: Fairly independent.  Prior/Current Home Services No current home services  Social Drivers of Health Review SDOH reviewed no interventions necessary  Readmission risk has been reviewed Yes  Transition of care needs no transition of care needs at this time

## 2023-06-29 NOTE — Hospital Course (Signed)
78 y.o. female with medical history significant of COPD, chronic hypoxic respiratory failure on 2 L oxygen, CAD, diastolic CHF, status post prior stents, A-fib, on Eliquis, hypertension, GERD, chronic anticoagulation, history of DVT in the past, diabetes mellitus type 2, insulin-dependent who presents to the emergency department due to several days of onset of shortness of breath which has been worsening, this was associated with increased cough.  Patient now presents with severe shortness of breath despite being on supplemental oxygen and could barely take 1 or 2 steps without having to stop to take a breath.  Patient denies diarrhea or vomiting.  O2 sat on arrival to the ED was 80% on 2 LPM.   ED Course:  In the emergency department, she was tachypneic, pulse was 84 bpm on arrival to the ED, BP 106/88, O2 sat was 98 to 94% on supplemental oxygen via [place] at 4 LPM.  Workup in the ED showed normal CBC except for WBC of 11.0, normal BMP.  Troponin 45 > 43.  Chest x-ray was suggestive of multifocal pneumonia Patient was treated with vancomycin and Zosyn.  IV Solu-Medrol 25 mg x 1 was given.  Hospitalist was asked to admit patient for further evaluation and management.

## 2023-06-29 NOTE — Progress Notes (Signed)
PROGRESS NOTE   KAPRICE KAGE  LOV:[id number] DOB: 08/15/1945 DOA: 06/28/2023 PCP: Benetta Spar, MD   Chief Complaint  Patient presents with   Cough   Level of care: Stepdown  Brief Admission History:  78 y.o. female with medical history significant of COPD, chronic hypoxic respiratory failure on 2 L oxygen, CAD, diastolic CHF, status post prior stents, A-fib, on Eliquis, hypertension, GERD, chronic anticoagulation, history of DVT in the past, diabetes mellitus type 2, insulin-dependent who presents to the emergency department due to several days of onset of shortness of breath which has been worsening, this was associated with increased cough.  Patient now presents with severe shortness of breath despite being on supplemental oxygen and could barely take 1 or 2 steps without having to stop to take a breath.  Patient denies diarrhea or vomiting.  O2 sat on arrival to the ED was 80% on 2 LPM.   ED Course:  In the emergency department, she was tachypneic, pulse was 84 bpm on arrival to the ED, BP 106/88, O2 sat was 98 to 94% on supplemental oxygen via [place] at 4 LPM.  Workup in the ED showed normal CBC except for WBC of 11.0, normal BMP.  Troponin 45 > 43.  Chest x-ray was suggestive of multifocal pneumonia Patient was treated with vancomycin and Zosyn.  IV Solu-Medrol 25 mg x 1 was given.  Hospitalist was asked to admit patient for further evaluation and management.     Assessment and Plan:  Multifocal pneumonia Patient was started on vancomycin and Zosyn, we shall continue same at this time with plan to de-escalate/discontinue based on blood culture, sputum culture, urine Legionella, strep pneumo and procalcitonin likely on 2/3.  Continue Tylenol as needed Continue Mucinex, Robitussin, incentive spirometry, flutter valve    Acute on chronic respiratory failure with hypoxia in the setting of above Patient is currently on supplemental oxygen 4 LPM (she uses 2 LPM at home) Continue  supplemental oxygen to maintain O2 sats > 92% and consider weaning patient down to home oxygen requirement as tolerated.   Elevated troponin Troponin 45 > 43 suspect mild elevation demand ischemia   Morbid obesity (BMI 48.89) Patient was counseled about the cardiovascular and metabolic risk of morbid obesity. Patient was counseled for diet control, exercise regimen and weight loss.    Chronic diastolic CHF Continue total input/output, daily weights and fluid restriction Continue heart healthy diet      Echocardiogram pending    History of previous DVT Continue Eliquis   Chronic atrial fibrillation Continue Cardizem, bisoprolol, Eliquis   CAD Patient denies chest pain Continue Cardizem, bisoprolol, Eliquis   Acquired hypothyroidism Continue Synthroid   Type 2 diabetes mellitus Hemoglobin A1c done on 06/25/2022 was 8.6 Continue ISS and hypoglycemia protocol Resume home basal insulin when home meds reconciled   CBG (last 3)  Recent Labs    06/29/23 0834 06/29/23 1157  GLUCAP 224* 234*   DVT prophylaxis: apixaban  Code Status: DNR  Family Communication:  Disposition: anticipate return to SNF   Consultants:   Procedures:   Antimicrobials:  Zosyn, vancomycin 2/1>>   Subjective: Pt complaining of pain, cough and chest congestion.   Objective: Vitals:   06/29/23 1006 06/29/23 1021 06/29/23 1100 06/29/23 1155  BP: (!) 83/45 94/67    Pulse: (!) 110 (!) 104 99   Resp: 20 (!) 24 18   Temp:    97.8 F (36.6 C)  TempSrc:    Oral  SpO2: 92% 93% 94%  Weight:      Height:       No intake or output data in the 24 hours ending 06/29/23 1211 Filed Weights   06/28/23 1700  Weight: 125.2 kg   Examination:  General exam: Appears calm and comfortable  Respiratory system: rales heard bilaterally. Moderate increased work of breathing.   Cardiovascular system: normal S1 & S2 heard. No JVD, murmurs, rubs, gallops or clicks. No pedal edema. Gastrointestinal system:  Abdomen is nondistended, soft and nontender. No organomegaly or masses felt. Normal bowel sounds heard. Central nervous system: Alert and oriented. No focal neurological deficits. Extremities: Symmetric 5 x 5 power. Skin: No rashes, lesions or ulcers. Psychiatry: Judgement and insight appear normal. Mood & affect appropriate.   Data Reviewed: I have personally reviewed following labs and imaging studies  CBC: Recent Labs  Lab 06/28/23 1725 06/29/23 0424  WBC 11.0* 8.4  NEUTROABS 9.3*  --   HGB 14.0 13.5  HCT 44.8 45.0  MCV 95.7 96.4  PLT 197 176    Basic Metabolic Panel: Recent Labs  Lab 06/28/23 1725 06/29/23 0424  NA 143 140  K 4.5 4.5  CL 103 101  CO2 30 27  GLUCOSE 77 190*  BUN 18 22  CREATININE 0.97 0.87  CALCIUM 9.1 8.9  MG  --  2.0  PHOS  --  4.2    CBG: Recent Labs  Lab 06/29/23 0834 06/29/23 1157  GLUCAP 224* 234*    Recent Results (from the past 240 hours)  Resp panel by RT-PCR (RSV, Flu A&B, Covid) Anterior Nasal Swab     Status: None   Collection Time: 06/28/23  5:03 PM   Specimen: Anterior Nasal Swab  Result Value Ref Range Status   SARS Coronavirus 2 by RT PCR NEGATIVE NEGATIVE Final    Comment: (NOTE) SARS-CoV-2 target nucleic acids are NOT DETECTED.  The SARS-CoV-2 RNA is generally detectable in upper respiratory specimens during the acute phase of infection. The lowest concentration of SARS-CoV-2 viral copies this assay can detect is 138 copies/mL. A negative result does not preclude SARS-Cov-2 infection and should not be used as the sole basis for treatment or other patient management decisions. A negative result may occur with  improper specimen collection/handling, submission of specimen other than nasopharyngeal swab, presence of viral mutation(s) within the areas targeted by this assay, and inadequate number of viral copies(<138 copies/mL). A negative result must be combined with clinical observations, patient history, and  epidemiological information. The expected result is Negative.  Fact Sheet for Patients:  BloggerCourse.com  Fact Sheet for Healthcare Providers:  SeriousBroker.it  This test is no t yet approved or cleared by the [place] FDA and  has been authorized for detection and/or diagnosis of SARS-CoV-2 by FDA under an Emergency Use Authorization (EUA). This EUA will remain  in effect (meaning this test can be used) for the duration of the COVID-19 declaration under Section 564(b)(1) of the Act, 21 U.S.C.section 360bbb-3(b)(1), unless the authorization is terminated  or revoked sooner.       Influenza A by PCR NEGATIVE NEGATIVE Final   Influenza B by PCR NEGATIVE NEGATIVE Final    Comment: (NOTE) The Xpert Xpress SARS-CoV-2/FLU/RSV plus assay is intended as an aid in the diagnosis of influenza from Nasopharyngeal swab specimens and should not be used as a sole basis for treatment. Nasal washings and aspirates are unacceptable for Xpert Xpress SARS-CoV-2/FLU/RSV testing.  Fact Sheet for Patients: BloggerCourse.com  Fact Sheet for Healthcare Providers: SeriousBroker.it  This  test is not yet approved or cleared by the [place] and has been authorized for detection and/or diagnosis of SARS-CoV-2 by FDA under an Emergency Use Authorization (EUA). This EUA will remain in effect (meaning this test can be used) for the duration of the COVID-19 declaration under Section 564(b)(1) of the Act, 21 U.S.C. section 360bbb-3(b)(1), unless the authorization is terminated or revoked.     Resp Syncytial Virus by PCR NEGATIVE NEGATIVE Final    Comment: (NOTE) Fact Sheet for Patients: BloggerCourse.com  Fact Sheet for Healthcare Providers: SeriousBroker.it  This test is not yet approved or cleared by the [place] FDA and has been  authorized for detection and/or diagnosis of SARS-CoV-2 by FDA under an Emergency Use Authorization (EUA). This EUA will remain in effect (meaning this test can be used) for the duration of the COVID-19 declaration under Section 564(b)(1) of the Act, 21 U.S.C. section 360bbb-3(b)(1), unless the authorization is terminated or revoked.  Performed at [facility], [address], [place], [place] [address]   Blood culture (routine x 2)     Status: None (Preliminary result)   Collection Time: 06/28/23  8:03 PM   Specimen: BLOOD RIGHT HAND  Result Value Ref Range Status   Specimen Description BLOOD RIGHT HAND  Final   Special Requests   Final    BOTTLES DRAWN AEROBIC ONLY Blood Culture adequate volume   Culture   Final    NO GROWTH < 12 HOURS Performed at [facility], [address] [place], [place] [address]    Report Status PENDING  Incomplete  Blood culture (routine x 2)     Status: None (Preliminary result)   Collection Time: 06/28/23  8:10 PM   Specimen: Left Antecubital; Blood  Result Value Ref Range Status   Specimen Description LEFT ANTECUBITAL  Final   Special Requests   Final    BOTTLES DRAWN AEROBIC ONLY Blood Culture adequate volume   Culture   Final    NO GROWTH < 12 HOURS Performed at [facility], [address], [place], [place] [address]    Report Status PENDING  Incomplete  MRSA Next Gen by PCR, Nasal     Status: None   Collection Time: 06/29/23  7:56 AM   Specimen: Nasal Mucosa; Nasal Swab  Result Value Ref Range Status   MRSA by PCR Next Gen NOT DETECTED NOT DETECTED Final    Comment: (NOTE) The GeneXpert MRSA Assay (FDA approved for NASAL specimens only), is one component of a comprehensive MRSA colonization surveillance program. It is not intended to diagnose MRSA infection nor to guide or monitor treatment for MRSA infections. Test performance is not FDA approved in patients less than 28 years old. Performed at [facility], [address]  [place], [place] [address]      Radiology Studies: DG Chest 2 View Result Date: 06/28/2023 CLINICAL DATA:  Cough, shortness of breath and chest pain for 1 week. EXAM: CHEST - 2 VIEW COMPARISON:  05/22/2023 FINDINGS: Exam detail is diminished secondary to patient's body habitus and suboptimal inspiration. Cardiac enlargement and aortic atherosclerosis. Enlargement of the pulmonary arteries compatible with PA hypertension. Opacities are noted within the periphery of the left midlung and both lung bases. Chronic coarsened interstitial markings noted bilaterally compatible with COPD/emphysema. Previous ACDF within the lower cervical spine. IMPRESSION: 1. Opacities within the periphery of the left midlung and both lung bases. Cannot exclude multifocal pneumonia. 2. COPD/emphysema. 3. Enlargement of the pulmonary arteries compatible with PA hypertension. Electronically  Signed   By: Signa Kell M.D.   On: 06/28/2023 17:47    Scheduled Meds:  atorvastatin  80 mg Oral Daily   bisoprolol  10 mg Oral Daily   Chlorhexidine Gluconate Cloth  6 each Topical Daily   dextromethorphan-guaiFENesin  1 tablet Oral BID   diltiazem  120 mg Oral Daily   insulin aspart  0-15 Units Subcutaneous TID WC   ipratropium-albuterol  3 mL Nebulization Q4H   levothyroxine  175 mcg Oral q morning   Continuous Infusions:  piperacillin-tazobactam (ZOSYN)  IV 3.375 g (06/29/23 0537)   vancomycin       LOS: 1 day   Time spent: 57 mins  Smantha Boakye Laural Benes, MD How to contact the [facility] Attending or Consulting provider 7A - 7P or covering provider during after hours 7P -7A, for this patient?  Check the care team in [facility] and look for a) attending/consulting TRH provider listed and b) the [facility] team listed Log into [email or web address] to find provider on call.  Locate the [facility] provider you are looking for under [facility] and page to a number that you can be directly reached. If you still have difficulty reaching the provider, please page  the [facility] (Director on Call) for the Hospitalists listed on amion for assistance.  06/29/2023, 12:11 PM

## 2023-06-29 NOTE — ED Notes (Signed)
Pt had accident on herself and she was changed and her bed linens changed as well. Pt stated that her urine burned coming out and it hurt bad enough to wake her. Pt placed on purewik at this time

## 2023-06-30 DIAGNOSIS — J189 Pneumonia, unspecified organism: Secondary | ICD-10-CM | POA: Diagnosis not present

## 2023-06-30 DIAGNOSIS — J9621 Acute and chronic respiratory failure with hypoxia: Secondary | ICD-10-CM | POA: Diagnosis not present

## 2023-06-30 DIAGNOSIS — R7989 Other specified abnormal findings of blood chemistry: Secondary | ICD-10-CM | POA: Diagnosis not present

## 2023-06-30 DIAGNOSIS — I482 Chronic atrial fibrillation, unspecified: Secondary | ICD-10-CM | POA: Diagnosis not present

## 2023-06-30 LAB — GLUCOSE, CAPILLARY
Glucose-Capillary: 148 mg/dL — ABNORMAL HIGH (ref 70–99)
Glucose-Capillary: 167 mg/dL — ABNORMAL HIGH (ref 70–99)
Glucose-Capillary: 136 mg/dL — ABNORMAL HIGH (ref 70–99)
Glucose-Capillary: 105 mg/dL — ABNORMAL HIGH (ref 70–99)
Glucose-Capillary: 190 mg/dL — ABNORMAL HIGH (ref 70–99)

## 2023-06-30 MED ORDER — POLYETHYLENE GLYCOL 3350 17 G PO PACK
17.0000 g | PACK | Freq: Two times a day (BID) | ORAL | Status: DC
  Administered 2023-07-01: 17 g via ORAL
  Filled 2023-06-30 (×6): qty 1

## 2023-06-30 MED ORDER — ARFORMOTEROL TARTRATE 15 MCG/2ML IN NEBU
15.0000 ug | INHALATION_SOLUTION | Freq: Two times a day (BID) | RESPIRATORY_TRACT | Status: DC
  Administered 2023-06-30 – 2023-07-06 (×12): 15 ug via RESPIRATORY_TRACT
  Filled 2023-06-30 (×12): qty 2

## 2023-06-30 MED ORDER — GABAPENTIN 400 MG PO CAPS
400.0000 mg | ORAL_CAPSULE | Freq: Every day | ORAL | Status: DC
  Administered 2023-06-30 – 2023-07-06 (×7): 400 mg via ORAL
  Filled 2023-06-30 (×7): qty 1

## 2023-06-30 MED ORDER — PANTOPRAZOLE SODIUM 40 MG PO TBEC
40.0000 mg | DELAYED_RELEASE_TABLET | Freq: Two times a day (BID) | ORAL | Status: DC
  Administered 2023-06-30 – 2023-07-07 (×15): 40 mg via ORAL
  Filled 2023-06-30 (×15): qty 1

## 2023-06-30 MED ORDER — IPRATROPIUM-ALBUTEROL 0.5-2.5 (3) MG/3ML IN SOLN
3.0000 mL | Freq: Four times a day (QID) | RESPIRATORY_TRACT | Status: DC
Start: 2023-06-30 — End: 2023-06-30

## 2023-06-30 MED ORDER — LEVALBUTEROL HCL 0.63 MG/3ML IN NEBU
0.6300 mg | INHALATION_SOLUTION | Freq: Four times a day (QID) | RESPIRATORY_TRACT | Status: DC
  Administered 2023-06-30 – 2023-07-01 (×5): 0.63 mg via RESPIRATORY_TRACT
  Filled 2023-06-30 (×5): qty 3

## 2023-06-30 MED ORDER — BUDESONIDE 0.25 MG/2ML IN SUSP
0.2500 mg | Freq: Two times a day (BID) | RESPIRATORY_TRACT | Status: DC
  Administered 2023-06-30 – 2023-07-02 (×5): 0.25 mg via RESPIRATORY_TRACT
  Filled 2023-06-30 (×5): qty 2

## 2023-06-30 MED ORDER — NYSTATIN 100000 UNIT/GM EX POWD
Freq: Two times a day (BID) | CUTANEOUS | Status: DC
  Filled 2023-06-30 (×5): qty 15

## 2023-06-30 MED ORDER — APIXABAN 5 MG PO TABS
5.0000 mg | ORAL_TABLET | Freq: Two times a day (BID) | ORAL | Status: DC
  Administered 2023-06-30 – 2023-07-07 (×15): 5 mg via ORAL
  Filled 2023-06-30 (×15): qty 1

## 2023-06-30 MED ORDER — MIRABEGRON ER 25 MG PO TB24
25.0000 mg | ORAL_TABLET | Freq: Every day | ORAL | Status: DC
  Administered 2023-06-30 – 2023-07-07 (×8): 25 mg via ORAL
  Filled 2023-06-30 (×8): qty 1

## 2023-06-30 MED ORDER — IPRATROPIUM-ALBUTEROL 0.5-2.5 (3) MG/3ML IN SOLN
3.0000 mL | Freq: Four times a day (QID) | RESPIRATORY_TRACT | Status: DC
  Administered 2023-06-30: 3 mL via RESPIRATORY_TRACT
  Filled 2023-06-30: qty 3

## 2023-06-30 MED ORDER — FUROSEMIDE 40 MG PO TABS
40.0000 mg | ORAL_TABLET | Freq: Two times a day (BID) | ORAL | Status: DC
  Administered 2023-06-30 – 2023-07-01 (×3): 40 mg via ORAL
  Filled 2023-06-30 (×4): qty 1

## 2023-06-30 MED ORDER — FERROUS SULFATE 325 (65 FE) MG PO TABS
325.0000 mg | ORAL_TABLET | Freq: Every day | ORAL | Status: DC
  Administered 2023-07-01 – 2023-07-07 (×7): 325 mg via ORAL
  Filled 2023-06-30 (×7): qty 1

## 2023-06-30 NOTE — Progress Notes (Signed)
PROGRESS NOTE   Ann [name]  ZOX:[id number] DOB: Apr 02, 1946 DOA: 06/28/2023 PCP: Benetta Spar, MD   Chief Complaint  Patient presents with   Cough   Level of care: Telemetry  Brief Admission History:  78 y.o. female with medical history significant of COPD, chronic hypoxic respiratory failure on 2 L oxygen, CAD, diastolic CHF, status post prior stents, A-fib, on Eliquis, hypertension, GERD, chronic anticoagulation, history of DVT in the past, diabetes mellitus type 2, insulin-dependent who presents to the emergency department due to several days of onset of shortness of breath which has been worsening, this was associated with increased cough.  Patient now presents with severe shortness of breath despite being on supplemental oxygen and could barely take 1 or 2 steps without having to stop to take a breath.  Patient denies diarrhea or vomiting.  O2 sat on arrival to the ED was 80% on 2 LPM.   ED Course:  In the emergency department, she was tachypneic, pulse was 84 bpm on arrival to the ED, BP 106/88, O2 sat was 98 to 94% on supplemental oxygen via  at 4 LPM.  Workup in the ED showed normal CBC except for WBC of 11.0, normal BMP.  Troponin 45 > 43.  Chest x-ray was suggestive of multifocal pneumonia Patient was treated with vancomycin and Zosyn.  IV Solu-Medrol 25 mg x 1 was given.  Hospitalist was asked to admit patient for further evaluation and management.     Assessment and Plan:  Multifocal pneumonia Patient was started on vancomycin and Zosyn, we shall continue same at this time with plan to de-escalate/discontinue based on blood culture, sputum culture, urine Legionella, strep pneumo and procalcitonin likely on 2/3.  Continue Tylenol as needed Continue Mucinex, Robitussin, incentive spirometry, flutter valve    Acute on chronic respiratory failure with hypoxia in the setting of above Patient is currently on supplemental oxygen 4 LPM (she uses 2 LPM at home) Continue  supplemental oxygen to maintain O2 sats > 92% and consider weaning patient down to home oxygen requirement as tolerated.   Elevated troponin Troponin 45 > 43 suspect mild elevation demand ischemia   Morbid obesity (BMI 48.89) Patient was counseled about the cardiovascular and metabolic risk of morbid obesity. Patient was counseled for diet control, exercise regimen and weight loss.    Chronic diastolic CHF Continue total input/output, daily weights and fluid restriction Continue heart healthy diet      Echocardiogram 4/24: normal LVEF, indeterminate DD History of previous DVT Continue Eliquis   Chronic atrial fibrillation Continue Cardizem, bisoprolol, Eliquis   CAD Patient denies chest pain Continue Cardizem, bisoprolol, Eliquis   Acquired hypothyroidism Continue Synthroid   Type 2 diabetes mellitus Hemoglobin A1c done on 06/25/2022 was 8.6 Continue ISS and hypoglycemia protocol Resume home basal insulin when home meds reconciled   CBG (last 3)  Recent Labs    06/29/23 2216 06/30/23 0227 06/30/23 0737  GLUCAP 179* 148* 167*   DVT prophylaxis: apixaban  Code Status: DNR  Family Communication:  Disposition: anticipate return to SNF   Consultants:   Procedures:   Antimicrobials:  Zosyn, vancomycin 2/1>>   Subjective: C/o nausea but no emesis and no abdominal pain;    Objective: Vitals:   06/30/23 0413 06/30/23 0421 06/30/23 0634 06/30/23 0648  BP:  (!) 149/110 (!) 128/105   Pulse:  78 69   Resp:  20 20   Temp:  97.9 F (36.6 C) 98 F (36.7 C)   TempSrc:  Oral  SpO2:  99% 100% 96%  Weight: 115.5 kg     Height: 5\' 3"  (1.6 m)       Intake/Output Summary (Last 24 hours) at 06/30/2023 1026 Last data filed at 06/30/2023 0427 Gross per 24 hour  Intake 131.72 ml  Output 400 ml  Net -268.28 ml   Filed Weights   06/28/23 1700 06/30/23 0413  Weight: 125.2 kg 115.5 kg   Examination:  General exam: morbidly obese female, Appears calm and comfortable   Respiratory system: rales heard bilaterally. Bilateral expiratory wheezing heard. Moderate increased work of breathing.   Cardiovascular system: normal S1 & S2 heard. No JVD, murmurs, rubs, gallops or clicks. No pedal edema. Gastrointestinal system: Abdomen is nondistended, soft and nontender. No organomegaly or masses felt. Normal bowel sounds heard. Central nervous system: Alert and oriented. No focal neurological deficits. Extremities: Symmetric 5 x 5 power. Skin: No rashes, lesions or ulcers. Psychiatry: Judgement and insight appear normal. Mood & affect appropriate.   Data Reviewed: I have personally reviewed following labs and imaging studies  CBC: Recent Labs  Lab 06/28/23 1725 06/29/23 0424  WBC 11.0* 8.4  NEUTROABS 9.3*  --   HGB 14.0 13.5  HCT 44.8 45.0  MCV 95.7 96.4  PLT 197 176    Basic Metabolic Panel: Recent Labs  Lab 06/28/23 1725 06/29/23 0424  NA 143 140  K 4.5 4.5  CL 103 101  CO2 30 27  GLUCOSE 77 190*  BUN 18 22  CREATININE 0.97 0.87  CALCIUM 9.1 8.9  MG  --  2.0  PHOS  --  4.2    CBG: Recent Labs  Lab 06/29/23 1157 06/29/23 1628 06/29/23 2216 06/30/23 0227 06/30/23 0737  GLUCAP 234* 188* 179* 148* 167*    Recent Results (from the past 240 hours)  Resp panel by RT-PCR (RSV, Flu A&B, Covid) Anterior Nasal Swab     Status: None   Collection Time: 06/28/23  5:03 PM   Specimen: Anterior Nasal Swab  Result Value Ref Range Status   SARS Coronavirus 2 by RT PCR NEGATIVE NEGATIVE Final    Comment: (NOTE) SARS-CoV-2 target nucleic acids are NOT DETECTED.  The SARS-CoV-2 RNA is generally detectable in upper respiratory specimens during the acute phase of infection. The lowest concentration of SARS-CoV-2 viral copies this assay can detect is 138 copies/mL. A negative result does not preclude SARS-Cov-2 infection and should not be used as the sole basis for treatment or other patient management decisions. A negative result may occur with   improper specimen collection/handling, submission of specimen other than nasopharyngeal swab, presence of viral mutation(s) within the areas targeted by this assay, and inadequate number of viral copies(<138 copies/mL). A negative result must be combined with clinical observations, patient history, and epidemiological information. The expected result is Negative.  Fact Sheet for Patients:  BloggerCourse.com  Fact Sheet for Healthcare Providers:  SeriousBroker.it  This test is no t yet approved or cleared by the [place] FDA and  has been authorized for detection and/or diagnosis of SARS-CoV-2 by FDA under an Emergency Use Authorization (EUA). This EUA will remain  in effect (meaning this test can be used) for the duration of the COVID-19 declaration under Section 564(b)(1) of the Act, 21 U.S.C.section 360bbb-3(b)(1), unless the authorization is terminated  or revoked sooner.       Influenza A by PCR NEGATIVE NEGATIVE Final   Influenza B by PCR NEGATIVE NEGATIVE Final    Comment: (NOTE) The Xpert Xpress SARS-CoV-2/FLU/RSV plus assay  is intended as an aid in the diagnosis of influenza from Nasopharyngeal swab specimens and should not be used as a sole basis for treatment. Nasal washings and aspirates are unacceptable for Xpert Xpress SARS-CoV-2/FLU/RSV testing.  Fact Sheet for Patients: BloggerCourse.com  Fact Sheet for Healthcare Providers: SeriousBroker.it  This test is not yet approved or cleared by the [place] FDA and has been authorized for detection and/or diagnosis of SARS-CoV-2 by FDA under an Emergency Use Authorization (EUA). This EUA will remain in effect (meaning this test can be used) for the duration of the COVID-19 declaration under Section 564(b)(1) of the Act, 21 U.S.C. section 360bbb-3(b)(1), unless the authorization is terminated or revoked.      Resp Syncytial Virus by PCR NEGATIVE NEGATIVE Final    Comment: (NOTE) Fact Sheet for Patients: BloggerCourse.com  Fact Sheet for Healthcare Providers: SeriousBroker.it  This test is not yet approved or cleared by the [place] FDA and has been authorized for detection and/or diagnosis of SARS-CoV-2 by FDA under an Emergency Use Authorization (EUA). This EUA will remain in effect (meaning this test can be used) for the duration of the COVID-19 declaration under Section 564(b)(1) of the Act, 21 U.S.C. section 360bbb-3(b)(1), unless the authorization is terminated or revoked.  Performed at [facility] [address] [place], [place] [address]   Blood culture (routine x 2)     Status: None (Preliminary result)   Collection Time: 06/28/23  8:03 PM   Specimen: BLOOD RIGHT HAND  Result Value Ref Range Status   Specimen Description BLOOD RIGHT HAND  Final   Special Requests   Final    BOTTLES DRAWN AEROBIC ONLY Blood Culture adequate volume   Culture   Final    NO GROWTH < 12 HOURS Performed at [facility], [address], [place], [place] [address]    Report Status PENDING  Incomplete  Blood culture (routine x 2)     Status: None (Preliminary result)   Collection Time: 06/28/23  8:10 PM   Specimen: Left Antecubital; Blood  Result Value Ref Range Status   Specimen Description LEFT ANTECUBITAL  Final   Special Requests   Final    BOTTLES DRAWN AEROBIC ONLY Blood Culture adequate volume   Culture   Final    NO GROWTH < 12 HOURS Performed at [facility], [address], [place], [place] [address]    Report Status PENDING  Incomplete  MRSA Next Gen by PCR, Nasal     Status: None   Collection Time: 06/29/23  7:56 AM   Specimen: Nasal Mucosa; Nasal Swab  Result Value Ref Range Status   MRSA by PCR Next Gen NOT DETECTED NOT DETECTED Final    Comment: (NOTE) The GeneXpert MRSA Assay (FDA approved for NASAL specimens only), is  one component of a comprehensive MRSA colonization surveillance program. It is not intended to diagnose MRSA infection nor to guide or monitor treatment for MRSA infections. Test performance is not FDA approved in patients less than 52 years old. Performed at [facility], [address] [place], [place] [address]      Radiology Studies: DG Chest 2 View Result Date: 06/28/2023 CLINICAL DATA:  Cough, shortness of breath and chest pain for 1 week. EXAM: CHEST - 2 VIEW COMPARISON:  05/22/2023 FINDINGS: Exam detail is diminished secondary to patient's body habitus and suboptimal inspiration. Cardiac enlargement and aortic atherosclerosis. Enlargement of the pulmonary arteries compatible with PA hypertension. Opacities are noted within the periphery of the left midlung  and both lung bases. Chronic coarsened interstitial markings noted bilaterally compatible with COPD/emphysema. Previous ACDF within the lower cervical spine. IMPRESSION: 1. Opacities within the periphery of the left midlung and both lung bases. Cannot exclude multifocal pneumonia. 2. COPD/emphysema. 3. Enlargement of the pulmonary arteries compatible with PA hypertension. Electronically Signed   By: Signa Kell M.D.   On: 06/28/2023 17:47    Scheduled Meds:  atorvastatin  80 mg Oral Daily   bisoprolol  10 mg Oral Daily   dextromethorphan-guaiFENesin  1 tablet Oral BID   diltiazem  120 mg Oral Daily   insulin aspart  0-15 Units Subcutaneous TID WC   insulin aspart protamine- aspart  30 Units Subcutaneous BID AC   levalbuterol  0.63 mg Nebulization Q6H   levothyroxine  175 mcg Oral q morning   Continuous Infusions:  piperacillin-tazobactam (ZOSYN)  IV 3.375 g (06/30/23 0630)    LOS: 2 days   Time spent: 55 mins  Roselyne Stalnaker Laural Benes, MD How to contact the [facility] Attending or Consulting provider 7A - 7P or covering provider during after hours 7P -7A, for this patient?  Check the care team in [facility] and look for a) attending/consulting  TRH provider listed and b) the [facility] team listed Log into [email or web address] to find provider on call.  Locate the [facility] provider you are looking for under [facility] and page to a number that you can be directly reached. If you still have difficulty reaching the provider, please page the [facility] (Director on Call) for the Hospitalists listed on amion for assistance.  06/30/2023, 10:26 AM

## 2023-06-30 NOTE — TOC Initial Note (Signed)
Transition of Care [facility]) - Initial/Assessment Note    Patient Details  Name: Ann [name] MRN: [id number] Date of Birth: Oct 09, 1945  Transition of [facility]) CM/SW Contact:    Karn Cassis, LCSW Phone Number: 06/30/2023, 2:55 PM  Clinical Narrative:  Pt admitted with multifocal pneumonia. Assessment completed due to high risk readmission score. Pt lives with her husband, 2 sons, and other family members. She is fairly independent with ADLs at baseline. No home health services reported. TOC will continue to follow.                  Expected Discharge Plan: Home/Self Care Barriers to Discharge: Continued Medical Work up   Patient Goals and CMS Choice Patient states their goals for this hospitalization and ongoing recovery are:: return home     [place] ownership interest in [facility].provided to::  (n/a)    Expected Discharge Plan and Services In-house Referral: Clinical Social Work     Living arrangements for the past 2 months: Single Family Home                                      Prior Living Arrangements/Services Living arrangements for the past 2 months: Single Family Home Lives with:: Spouse, Adult Children Patient language and need for interpreter reviewed:: Yes Do you feel safe going back to the place where you live?: Yes          Current home services: DME Criminal Activity/Legal Involvement Pertinent to Current Situation/Hospitalization: No - Comment as needed  Activities of Daily Living   ADL Screening (condition at time of admission) Independently performs ADLs?: Yes (appropriate for developmental age) Is the patient deaf or have difficulty hearing?: No Does the patient have difficulty seeing, even when wearing glasses/contacts?: No Does the patient have difficulty concentrating, remembering, or making decisions?: No  Permission Sought/Granted                  Emotional Assessment         Alcohol / Substance  Use: Not Applicable Psych Involvement: No (comment)  Admission diagnosis:  Acute on chronic respiratory failure with hypoxia (HCC) [J96.21] Multifocal pneumonia [J18.9] Patient Active Problem List   Diagnosis Date Noted   Multifocal pneumonia 06/28/2023   Insulin long-term use (HCC) 06/26/2023   COPD exacerbation (HCC) 03/21/2023   Cellulitis 03/17/2023   Acute on chronic respiratory failure with hypoxia (HCC) 12/03/2022   Iron deficiency anemia due to chronic blood loss 10/29/2022   Elevated brain natriuretic peptide (BNP) level 09/17/2022   Chronic respiratory failure with hypoxia (HCC) 04/30/2022   History of colostomy reversal 12/18/2020   Abnormality of gait 07/04/2020   Colostomy in place [facility]) 06/13/2020   Colostomy, retracted (HCC) 06/13/2020   Labile blood glucose    Supplemental oxygen dependent    Steroid-induced hyperglycemia    Controlled type 2 diabetes mellitus with hyperglycemia, with long-term current use of insulin (HCC)    Hypoalbuminemia due to protein-calorie malnutrition (HCC)    Chronic diastolic congestive heart failure (HCC)    Acute blood loss anemia    Debility 05/25/2020   Sigmoid Diverticulitis with Perforation-- 05/15/2020   Diverticulitis of large intestine with perforation with bleeding    Sepsis Due to Sigmoid Diverticulitis with Perforation 05/14/2020   Acute GI bleeding 05/14/2020   Acute on chronic anemia-Due to Acute Blood Loss 05/14/2020   Acute respiratory failure  with hypoxia (HCC) 01/24/2020   Atrial fibrillation with RVR (HCC) 01/23/2020   Anxiety    Uncontrolled type 2 diabetes mellitus with hyperglycemia, with long-term current use of insulin (HCC)    Weakness 12/10/2019   Acute pain of left knee 08/31/2019   History of DVT (deep vein thrombosis)    Coronary artery disease of native artery of native heart with stable angina pectoris (HCC)    COPD GOLD 0 / 02 dep / prob Cor pulmonale    Atrial fibrillation with rapid ventricular  response (HCC) 07/13/2019   COPD with acute exacerbation (HCC) 04/04/2018   Type 2 diabetes mellitus (HCC) 04/04/2018   PAF (paroxysmal atrial fibrillation) (HCC) 04/04/2018   Constipation by delayed colonic transit 10/23/2016   Fatty liver 12/11/2015   GERD without esophagitis 07/14/2015   Hypotension 03/15/2013   Elevated troponin 03/15/2013   VRE (vancomycin resistant enterococcus) culture positive 10/10/2012   Nonsustained ventricular tachycardia (HCC) 10/10/2012   Cellulitis of arm, right 10/06/2012   Hydronephrosis 09/15/2012   Abdominal mass, right lower quadrant 09/10/2012   Microcytic anemia 08/22/2012   Acute on chronic diastolic heart failure (HCC) 08/06/2012   UTI (urinary tract infection) 07/30/2012   Atrial fibrillation, chronic (HCC) 07/02/2012   Vitamin B12 deficiency 06/28/2012   Sinusitis chronic, frontal 06/28/2012   Obesity, Class III, BMI 40-49.9 (morbid obesity) (HCC) 06/28/2012   Chronic kidney disease, stage III (moderate) (HCC) 06/28/2012   ARF (acute renal failure) (HCC) 06/27/2012   Long term current use of anticoagulant therapy 06/27/2012   DOE (dyspnea on exertion) 06/27/2012   Adrenal insufficiency (HCC) 01/07/2012   Uncontrolled type 2 diabetes mellitus with hyperglycemia (HCC) 04/19/2011   Chronic diastolic heart failure (HCC) 04/19/2011   GANGLION OF TENDON SHEATH 02/21/2010   Acquired hypothyroidism 12/01/2008   Mixed hyperlipidemia 12/01/2008   Essential hypertension 12/01/2008   Coronary atherosclerosis 12/01/2008   RENAL FAILURE, CHRONIC 12/01/2008   PCP:  Benetta Spar, MD Pharmacy:   [facility] [phone] - [place], [place] - [address] [place] [address] Phone: [phone] Fax: [phone]  [facility] - [place], [place] - [address] [place] [address] [place] [address] Phone: [phone] Fax: [phone]     Social  Drivers of Health (SDOH) Social History: SDOH Screenings   Food Insecurity: No Food Insecurity (06/29/2023)  Housing: Low Risk  (06/29/2023)  Transportation Needs: No Transportation Needs (06/29/2023)  Utilities: Not At Risk (06/29/2023)  Alcohol Screen: Low Risk  (10/22/2022)  Depression (PHQ2-9): Low Risk  (10/30/2022)  Recent Concern: Depression (PHQ2-9) - High Risk (10/16/2022)  Financial Resource Strain: Low Risk  (10/22/2022)  Physical Activity: Insufficiently Active (10/22/2022)  Social Connections: Unknown (06/29/2023)  Stress: No Stress Concern Present (10/22/2022)  Tobacco Use: Medium Risk (06/28/2023)   SDOH Interventions:     Readmission Risk Interventions    06/30/2023    2:53 PM 03/22/2023    1:49 PM 03/17/2023    5:19 PM  Readmission Risk Prevention Plan  Transportation Screening Complete  Complete  PCP or Specialist Appt within 3-5 Days  Complete   HRI or Home Care Consult Complete Complete Complete  Social Work Consult for Recovery Care Planning/Counseling Complete Complete Complete  Palliative Care Screening Not Applicable Complete Not Applicable  Medication Review Oceanographer) Complete Complete Complete

## 2023-07-01 DIAGNOSIS — J189 Pneumonia, unspecified organism: Secondary | ICD-10-CM | POA: Diagnosis not present

## 2023-07-01 DIAGNOSIS — I482 Chronic atrial fibrillation, unspecified: Secondary | ICD-10-CM | POA: Diagnosis not present

## 2023-07-01 DIAGNOSIS — R7989 Other specified abnormal findings of blood chemistry: Secondary | ICD-10-CM | POA: Diagnosis not present

## 2023-07-01 DIAGNOSIS — J9621 Acute and chronic respiratory failure with hypoxia: Secondary | ICD-10-CM | POA: Diagnosis not present

## 2023-07-01 LAB — GLUCOSE, CAPILLARY
Glucose-Capillary: 76 mg/dL (ref 70–99)
Glucose-Capillary: 84 mg/dL (ref 70–99)
Glucose-Capillary: 141 mg/dL — ABNORMAL HIGH (ref 70–99)
Glucose-Capillary: 93 mg/dL (ref 70–99)

## 2023-07-01 LAB — BASIC METABOLIC PANEL
Anion gap: 9 (ref 5–15)
BUN: 25 mg/dL — ABNORMAL HIGH (ref 8–23)
CO2: 31 mmol/L (ref 22–32)
Calcium: 8.8 mg/dL — ABNORMAL LOW (ref 8.9–10.3)
Chloride: 100 mmol/L (ref 98–111)
Creatinine, Ser: 0.92 mg/dL (ref 0.44–1.00)
GFR, Estimated: >60 mL/min (ref >60)
Glucose, Bld: 63 mg/dL — ABNORMAL LOW (ref 70–99)
Potassium: 3.8 mmol/L (ref 3.5–5.1)
Sodium: 140 mmol/L (ref 135–145)

## 2023-07-01 MED ORDER — INSULIN ASPART PROT & ASPART (70-30 MIX) 100 UNIT/ML ~~LOC~~ SUSP
20.0000 [IU] | Freq: Two times a day (BID) | SUBCUTANEOUS | Status: DC
  Administered 2023-07-01 – 2023-07-02 (×3): 20 [IU] via SUBCUTANEOUS

## 2023-07-01 MED ORDER — LOPERAMIDE HCL 2 MG PO CAPS
2.0000 mg | ORAL_CAPSULE | ORAL | Status: DC | PRN
  Administered 2023-07-01 (×2): 2 mg via ORAL
  Filled 2023-07-01 (×2): qty 1

## 2023-07-01 MED ORDER — SACCHAROMYCES BOULARDII 250 MG PO CAPS
250.0000 mg | ORAL_CAPSULE | Freq: Two times a day (BID) | ORAL | Status: DC
  Administered 2023-07-01 – 2023-07-07 (×13): 250 mg via ORAL
  Filled 2023-07-01 (×13): qty 1

## 2023-07-01 MED ORDER — LEVALBUTEROL HCL 0.63 MG/3ML IN NEBU
0.6300 mg | INHALATION_SOLUTION | Freq: Three times a day (TID) | RESPIRATORY_TRACT | Status: DC
  Administered 2023-07-02 (×2): 0.63 mg via RESPIRATORY_TRACT
  Filled 2023-07-01 (×2): qty 3

## 2023-07-01 NOTE — Plan of Care (Signed)
  Problem: Acute Rehab PT Goals(only PT should resolve) Goal: Pt Will Go Supine/Side To Sit Outcome: Progressing Flowsheets (Taken 07/01/2023 1508) Pt will go Supine/Side to Sit: with modified independence Goal: Patient Will Transfer Sit To/From Stand Outcome: Progressing Flowsheets (Taken 07/01/2023 1508) Patient will transfer sit to/from stand: with contact guard assist Goal: Pt Will Transfer Bed To Chair/Chair To Bed Outcome: Progressing Flowsheets (Taken 07/01/2023 1508) Pt will Transfer Bed to Chair/Chair to Bed: with contact guard assist Goal: Pt Will Ambulate Outcome: Progressing Flowsheets (Taken 07/01/2023 1508) Pt will Ambulate:  50 feet  with contact guard assist  with rolling walker

## 2023-07-01 NOTE — Evaluation (Signed)
 Physical Therapy Evaluation Patient Details Name: Ann [name] MRN: [id number] DOB: [birth year] Today's Date: 07/01/2023  History of Present Illness  Ann [name] is a 78 y.o. female with medical history significant of COPD, chronic hypoxic respiratory failure on 2 L oxygen , CAD, diastolic CHF, status post prior stents, A-fib, on Eliquis , hypertension, GERD, chronic anticoagulation, history of DVT in the past, diabetes mellitus type 2, insulin -dependent who presents to the emergency department due to several days of onset of shortness of breath which has been worsening, this was associated with increased cough.  Patient now presents with severe shortness of breath despite being on supplemental oxygen  and could barely take 1 or 2 steps without having to stop to take a breath.  Patient denies diarrhea or vomiting.  O2 sat on arrival to the ED was 80% on 2 LPM.    Clinical Impression  Patient lying in bed on therapist arrival.  Alert and agreeable to therapy assessment.  Patient performs supine to sit with max effort using bed railings to assist with pulling her trunk upright. Her O2 saturation in sitting on the edge of the bed is 96%.  Patient states she is feeling a bit swimmy headed but is after a moment agreeable to try to transfer to the chair.  Patient is able to stand with RW and min A for initial boost up to standing and take a couple steps over to the chair.  She does need cues for placement before sitting down.  After transfer; patient's O2 sat is at 92%.   patient left in chair with call button in reach, niece at bedside  and nursing notified of mobility status Pt is demonstrating functional limitations in mobility, transfers, ambulation and ADLs due to muscle weakness, deconditioning, and pain. Based upon these deficits/impairments, patient will benefit from continued skilled physical therapy services during remainder of hospital stay and at the next recommended venue of care to address  deficits and promote return to optimal function.            If plan is discharge home, recommend the following: A little help with walking and/or transfers;A little help with bathing/dressing/bathroom;Help with stairs or ramp for entrance;Assistance with cooking/housework   Can travel by private vehicle        Equipment Recommendations None recommended by PT  Recommendations for Other Services       Functional Status Assessment Patient has had a recent decline in their functional status and demonstrates the ability to make significant improvements in function in a reasonable and predictable amount of time.     Precautions / Restrictions Precautions Precautions: Fall Restrictions Weight Bearing Restrictions Per Provider Order: No      Mobility  Bed Mobility Overal bed mobility: Needs Assistance Bed Mobility: Supine to Sit     Supine to sit: HOB elevated, Used rails     General bed mobility comments: Supervision; has to use railings to pull trunk upright Patient Response: Cooperative  Transfers Overall transfer level: Needs assistance Equipment used: Rolling walker (2 wheels) Transfers: Sit to/from Stand, Bed to chair/wheelchair/BSC Sit to Stand: Min assist   Step pivot transfers: Min assist       General transfer comment: min A for initial boost up to standing    Ambulation/Gait Ambulation/Gait assistance: Min assist Gait Distance (Feet): 2 Feet Assistive device: Rolling walker (2 wheels) Gait Pattern/deviations: Trunk flexed Gait velocity: decreased        Stairs  Wheelchair Mobility     Tilt Bed Tilt Bed Patient Response: Cooperative  Modified Rankin (Stroke Patients Only)       Balance Overall balance assessment: Needs assistance Sitting-balance support: Bilateral upper extremity supported, Feet supported Sitting balance-Leahy Scale: Good Sitting balance - Comments: good sitting balance on the edge of the bed   Standing  balance support: During functional activity, Reliant on assistive device for balance, Bilateral upper extremity supported Standing balance-Leahy Scale: Fair Standing balance comment: fair standing balance with RW and CGA/min A                             Pertinent Vitals/Pain Pain Assessment Pain Assessment: 0-10 Pain Score: 6  Pain Location: chest and head Pain Intervention(s): Limited activity within patient's tolerance, Monitored during session    Home Living Family/patient expects to be discharged to:: Private residence Living Arrangements: Spouse/significant other;Children;Other relatives Available Help at Discharge: Family;Available 24 hours/day Type of Home: House Home Access: Ramped entrance       Home Layout: Two level;Able to live on main level with bedroom/bathroom;Full bath on main level Home Equipment: Rollator (4 wheels);BSC/3in1;Grab bars - tub/shower;Wheelchair - manual;Hospital bed;Cane - single Librarian, Academic (2 wheels);Hand held shower head      Prior Function Prior Level of Function : Needs assist       Physical Assist : Mobility (physical);ADLs (physical) Mobility (physical): Bed mobility;Transfers;Gait;Stairs ADLs (physical): Bathing;Dressing;Toileting;IADLs;Grooming Mobility Comments: household ambulator using RW ADLs Comments: Assist for lower body bathing, dressing, and toielting. Recent assist for grooming due to B UE weakness.     Extremity/Trunk Assessment   Upper Extremity Assessment Upper Extremity Assessment: Generalized weakness;Right hand dominant    Lower Extremity Assessment Lower Extremity Assessment: Generalized weakness    Cervical / Trunk Assessment Cervical / Trunk Assessment: Normal  Communication   Communication Communication: No apparent difficulties  Cognition Arousal: Alert Behavior During Therapy: WFL for tasks assessed/performed Overall Cognitive Status: Within Functional Limits for tasks assessed                                           General Comments      Exercises     Assessment/Plan    PT Assessment Patient needs continued PT services  PT Problem List Decreased strength;Pain;Cardiopulmonary status limiting activity;Decreased activity tolerance;Decreased balance;Decreased mobility       PT Treatment Interventions Balance training;Gait training;Functional mobility training;Therapeutic activities;Therapeutic exercise;Patient/family education    PT Goals (Current goals can be found in the Care Plan section)  Acute Rehab PT Goals Patient Stated Goal: return home PT Goal Formulation: With patient Time For Goal Achievement: 07/15/23 Potential to Achieve Goals: Good    Frequency Min 2X/week     Co-evaluation               AM-PAC PT 6 Clicks Mobility  Outcome Measure Help needed turning from your back to your side while in a flat bed without using bedrails?: A Little Help needed moving from lying on your back to sitting on the side of a flat bed without using bedrails?: A Little Help needed moving to and from a bed to a chair (including a wheelchair)?: A Little Help needed standing up from a chair using your arms (e.g., wheelchair or bedside chair)?: A Little Help needed to walk in hospital room?: A Little Help needed climbing  3-5 steps with a railing? : A Lot 6 Click Score: 17    End of Session Equipment Utilized During Treatment: Oxygen  Activity Tolerance: Patient tolerated treatment well;Other (comment) (reports being swimmy headed when upright) Patient left: in chair;with family/visitor present;with call bell/phone within reach Nurse Communication: Mobility status PT Visit Diagnosis: Unsteadiness on feet (R26.81);Other abnormalities of gait and mobility (R26.89);Muscle weakness (generalized) (M62.81)    Time: 1435-1500 PT Time Calculation (min) (ACUTE ONLY): 25 min   Charges:   PT Evaluation $PT Eval Moderate Complexity: 1 Mod    PT General Charges $$ ACUTE PT VISIT: 1 Visit         3:08 PM, 07/01/23 Shyler Holzman Small Hena Ewalt MPT [facility] [place] [phone] Ph:[phone]

## 2023-07-01 NOTE — Progress Notes (Signed)
 PROGRESS NOTE   Ann [name]  FMW:[id number] DOB: 04-17-1946 DOA: 06/28/2023 PCP: [name], MD   Chief Complaint  Patient presents with   Cough   Level of care: Telemetry  Brief Admission History:  78 y.o. female with medical history significant of COPD, chronic hypoxic respiratory failure on 2 L oxygen , CAD, diastolic CHF, status post prior stents, A-fib, on Eliquis , hypertension, GERD, chronic anticoagulation, history of DVT in the past, diabetes mellitus type 2, insulin -dependent who presents to the emergency department due to several days of onset of shortness of breath which has been worsening, this was associated with increased cough.  Patient now presents with severe shortness of breath despite being on supplemental oxygen  and could barely take 1 or 2 steps without having to stop to take a breath.  Patient denies diarrhea or vomiting.  O2 sat on arrival to the ED was 80% on 2 LPM.   ED Course:  In the emergency department, she was tachypneic, pulse was 84 bpm on arrival to the ED, BP 106/88, O2 sat was 98 to 94% on supplemental oxygen  via [place] at 4 LPM.  Workup in the ED showed normal CBC except for WBC of 11.0, normal BMP.  Troponin 45 > 43.  Chest x-ray was suggestive of multifocal pneumonia Patient was treated with vancomycin  and Zosyn .  IV Solu-Medrol  25 mg x 1 was given.  Hospitalist was asked to admit patient for further evaluation and management.     Assessment and Plan:  Multifocal pneumonia Patient was started on vancomycin  and Zosyn , we shall continue same at this time with plan to de-escalate/discontinue based on blood culture, sputum culture, urine Legionella, strep pneumo and procalcitonin likely on 2/3.  Continue Tylenol  as needed Continue Mucinex , Robitussin, incentive spirometry, flutter valve    Acute on chronic respiratory failure with hypoxia in the setting of above Patient is currently on supplemental oxygen  4 LPM (she uses 2 LPM at home) Continue  supplemental oxygen  to maintain O2 sats > 92% and consider weaning patient down to home oxygen  requirement as tolerated.   Elevated troponin Troponin 45 > 43 suspect mild elevation demand ischemia   Morbid obesity (BMI 48.89) Patient was counseled about the cardiovascular and metabolic risk of morbid obesity. Patient was counseled for diet control, exercise regimen and weight loss.    Chronic diastolic CHF Continue total input/output, daily weights and fluid restriction Continue heart healthy diet      Echocardiogram 4/24: normal LVEF, indeterminate DD History of previous DVT Continue apixaban  for full anticoagulation    Chronic atrial fibrillation Continue Cardizem , bisoprolol , apixaban    CAD Patient denies chest pain Continue Cardizem , bisoprolol , apixaban     Acquired hypothyroidism Continue levothyroxine     Type 2 diabetes mellitus Hemoglobin A1c done on 06/25/2022 was 8.6 Continue ISS and hypoglycemia protocol 70/30 insulin  reduced further to 20 units BID and HS snack added   CBG (last 3)  Recent Labs    07/01/23 0314 07/01/23 0744 07/01/23 1101  GLUCAP 76 84 141*   DVT prophylaxis: apixaban   Code Status: DNR  Family Communication:  Disposition: anticipate return to SNF   Consultants:   Procedures:   Antimicrobials:  Zosyn  2/1>>   Subjective: Pt having nausea and having loose stools since being treated with antibiotics; SOB and wheezing persistent.   Objective: Vitals:   06/30/23 2100 07/01/23 0212 07/01/23 0314 07/01/23 0734  BP:   134/70   Pulse:   66   Resp:   (!) 22   Temp:  98.1 F (36.7 C)   TempSrc:      SpO2: 95% 97% 96% 96%  Weight:   109.5 kg   Height:        Intake/Output Summary (Last 24 hours) at 07/01/2023 1212 Last data filed at 07/01/2023 0300 Gross per 24 hour  Intake 720 ml  Output 1100 ml  Net -380 ml   Filed Weights   06/28/23 1700 06/30/23 0413 07/01/23 0314  Weight: 125.2 kg 115.5 kg 109.5 kg    Examination:  General exam: morbidly obese female, Appears calm and comfortable  Respiratory system: rales heard bilaterally. Unchanged, bilateral expiratory wheezing heard. Moderate increased work of breathing.  Coarse anterior upper airway congestion sounds heard.  Cardiovascular system: normal S1 & S2 heard. No JVD, murmurs, rubs, gallops or clicks. No pedal edema. Gastrointestinal system: Abdomen is nondistended, soft and nontender. No organomegaly or masses felt. Normal bowel sounds heard. Central nervous system: Alert and oriented. No focal neurological deficits. Extremities: Symmetric 5 x 5 power. Skin: No rashes, lesions or ulcers. Psychiatry: Judgement and insight appear normal. Mood & affect appropriate.   Data Reviewed: I have personally reviewed following labs and imaging studies  CBC: Recent Labs  Lab 06/28/23 1725 06/29/23 0424  WBC 11.0* 8.4  NEUTROABS 9.3*  --   HGB 14.0 13.5  HCT 44.8 45.0  MCV 95.7 96.4  PLT 197 176    Basic Metabolic Panel: Recent Labs  Lab 06/28/23 1725 06/29/23 0424 07/01/23 0343  NA 143 140 140  K 4.5 4.5 3.8  CL 103 101 100  CO2 30 27 31   GLUCOSE 77 190* 63*  BUN 18 22 25*  CREATININE 0.97 0.87 0.92  CALCIUM  9.1 8.9 8.8*  MG  --  2.0  --   PHOS  --  4.2  --     CBG: Recent Labs  Lab 06/30/23 1642 06/30/23 2005 07/01/23 0314 07/01/23 0744 07/01/23 1101  GLUCAP 105* 190* 76 84 141*    Recent Results (from the past 240 hours)  Resp panel by RT-PCR (RSV, Flu A&B, Covid) Anterior Nasal Swab     Status: None   Collection Time: 06/28/23  5:03 PM   Specimen: Anterior Nasal Swab  Result Value Ref Range Status   SARS Coronavirus 2 by RT PCR NEGATIVE NEGATIVE Final    Comment: (NOTE) SARS-CoV-2 target nucleic acids are NOT DETECTED.  The SARS-CoV-2 RNA is generally detectable in upper respiratory specimens during the acute phase of infection. The lowest concentration of SARS-CoV-2 viral copies this assay can detect  is 138 copies/mL. A negative result does not preclude SARS-Cov-2 infection and should not be used as the sole basis for treatment or other patient management decisions. A negative result may occur with  improper specimen collection/handling, submission of specimen other than nasopharyngeal swab, presence of viral mutation(s) within the areas targeted by this assay, and inadequate number of viral copies(<138 copies/mL). A negative result must be combined with clinical observations, patient history, and epidemiological information. The expected result is Negative.  Fact Sheet for Patients:  bloggercourse.com  Fact Sheet for Healthcare Providers:  seriousbroker.it  This test is no t yet approved or cleared by the [place]  FDA and  has been authorized for detection and/or diagnosis of SARS-CoV-2 by FDA under an Emergency Use Authorization (EUA). This EUA will remain  in effect (meaning this test can be used) for the duration of the COVID-19 declaration under Section 564(b)(1) of the Act, 21 U.S.C.section 360bbb-3(b)(1), unless the authorization is terminated  or revoked sooner.       Influenza A by PCR NEGATIVE NEGATIVE Final   Influenza B by PCR NEGATIVE NEGATIVE Final    Comment: (NOTE) The Xpert Xpress SARS-CoV-2/FLU/RSV plus assay is intended as an aid in the diagnosis of influenza from Nasopharyngeal swab specimens and should not be used as a sole basis for treatment. Nasal washings and aspirates are unacceptable for Xpert Xpress SARS-CoV-2/FLU/RSV testing.  Fact Sheet for Patients: bloggercourse.com  Fact Sheet for Healthcare Providers: seriousbroker.it  This test is not yet approved or cleared by the [place]  FDA and has been authorized for detection and/or diagnosis of SARS-CoV-2 by FDA under an Emergency Use Authorization (EUA). This EUA will remain in effect  (meaning this test can be used) for the duration of the COVID-19 declaration under Section 564(b)(1) of the Act, 21 U.S.C. section 360bbb-3(b)(1), unless the authorization is terminated or revoked.     Resp Syncytial Virus by PCR NEGATIVE NEGATIVE Final    Comment: (NOTE) Fact Sheet for Patients: bloggercourse.com  Fact Sheet for Healthcare Providers: seriousbroker.it  This test is not yet approved or cleared by the [place]  FDA and has been authorized for detection and/or diagnosis of SARS-CoV-2 by FDA under an Emergency Use Authorization (EUA). This EUA will remain in effect (meaning this test can be used) for the duration of the COVID-19 declaration under Section 564(b)(1) of the Act, 21 U.S.C. section 360bbb-3(b)(1), unless the authorization is terminated or revoked.  Performed at [facility], [address], [place], [place] [address]   Blood culture (routine x 2)     Status: None (Preliminary result)   Collection Time: 06/28/23  8:03 PM   Specimen: BLOOD RIGHT HAND  Result Value Ref Range Status   Specimen Description BLOOD RIGHT HAND  Final   Special Requests   Final    BOTTLES DRAWN AEROBIC ONLY Blood Culture adequate volume   Culture   Final    NO GROWTH 3 DAYS Performed at [facility], [address] [place], [place] [address]    Report Status PENDING  Incomplete  Blood culture (routine x 2)     Status: None (Preliminary result)   Collection Time: 06/28/23  8:10 PM   Specimen: Left Antecubital; Blood  Result Value Ref Range Status   Specimen Description LEFT ANTECUBITAL  Final   Special Requests   Final    BOTTLES DRAWN AEROBIC ONLY Blood Culture adequate volume   Culture   Final    NO GROWTH 3 DAYS Performed at [facility], [address] [place], [place] [address]    Report Status PENDING  Incomplete  MRSA Next Gen by PCR, Nasal     Status: None   Collection Time: 06/29/23  7:56 AM   Specimen: Nasal  Mucosa; Nasal Swab  Result Value Ref Range Status   MRSA by PCR Next Gen NOT DETECTED NOT DETECTED Final    Comment: (NOTE) The GeneXpert MRSA Assay (FDA approved for NASAL specimens only), is one component of a comprehensive MRSA colonization surveillance program. It is not intended to diagnose MRSA infection nor to guide or monitor treatment for MRSA infections. Test performance is not FDA approved in patients less than 15 years old. Performed at [facility], [address] [place], [place] [address]      Radiology Studies: No results found.   Scheduled Meds:  apixaban   5 mg Oral BID   arformoterol   15 mcg Nebulization BID   atorvastatin   80 mg Oral Daily  bisoprolol   10 mg Oral Daily   budesonide   0.25 mg Nebulization BID   dextromethorphan -guaiFENesin   1 tablet Oral BID   diltiazem   120 mg Oral Daily   ferrous sulfate   325 mg Oral Q breakfast   furosemide   40 mg Oral BID   gabapentin   400 mg Oral QHS   insulin  aspart  0-15 Units Subcutaneous TID WC   insulin  aspart protamine- aspart  20 Units Subcutaneous BID AC   levalbuterol   0.63 mg Nebulization Q6H WA   levothyroxine   175 mcg Oral q morning   mirabegron  ER  25 mg Oral Daily   nystatin    Topical BID   pantoprazole   40 mg Oral BID   polyethylene glycol  17 g Oral BID   saccharomyces boulardii  250 mg Oral BID   Continuous Infusions:  piperacillin -tazobactam (ZOSYN )  IV 3.375 g (07/01/23 0611)    LOS: 3 days   Time spent: 57 mins  Haedyn Breau Vicci, MD How to contact the [facility] Attending or Consulting provider 7A - 7P or covering provider during after hours 7P -7A, for this patient?  Check the care team in [facility] and look for a) attending/consulting TR[facility] provider listed and b) the TR[facility] team listed Log into [email or web address] to find provider on call.  Locate the [facility] provider you are looking for under [facility] and page to a number that you can be directly reached. If you still have difficulty reaching the provider,  please page the [facility] (Director on Call) for the Hospitalists listed on amion for assistance.  07/01/2023, 12:12 PM

## 2023-07-01 NOTE — Progress Notes (Signed)
 Mobility Specialist Progress Note:    07/01/23 1111  Mobility  Activity Transferred from chair to bed  Level of Assistance Minimal assist, patient does 75% or more  Assistive Device Front wheel walker  Distance Ambulated (ft) 3 ft  Range of Motion/Exercises Active;All extremities  Activity Response Tolerated well  Mobility Referral Yes  Mobility visit 1 Mobility  Mobility Specialist Start Time (ACUTE ONLY) 1040  Mobility Specialist Stop Time (ACUTE ONLY) 1055  Mobility Specialist Time Calculation (min) (ACUTE ONLY) 15 min   Pt received in chair, c/o SOB and requesting assistance to bed. Pt had [place] off, SpO2 84% on RA. Placed [place] back on, SpO2 93% on 5L. Pt required MinA to stand and transfer with RW. Left pt supine, notified nurse. All needs met.   Sherrilee Ditty Mobility Specialist Please contact via Special Educational Needs Teacher or  Rehab office at [phone]

## 2023-07-01 NOTE — Inpatient Diabetes Management (Addendum)
Inpatient Diabetes Program Recommendations  AACE/ADA: New Consensus Statement on Inpatient Glycemic Control  Target Ranges:  Prepandial:   less than 140 mg/dL      Peak postprandial:   less than 180 mg/dL (1-2 hours)      Critically ill patients:  140 - 180 mg/dL    Latest Reference Range & Units 06/30/23 07:37 06/30/23 12:00 06/30/23 16:42 06/30/23 20:05 07/01/23 03:14 07/01/23 07:44  Glucose-Capillary 70 - 99 mg/dL 161 (H) 096 (H) 045 (H) 190 (H) 76 84    Latest Reference Range & Units 07/01/23 03:43  Glucose 70 - 99 mg/dL 63 (L)    Latest Reference Range & Units 12/03/22 11:16 06/29/23 04:24  Hemoglobin A1C 4.8 - 5.6 % 7.3 (H) 9.0 (H)   Review of Glycemic Control  Diabetes history: DM2 Outpatient Diabetes medications: Glipizide XL 5 mg QAM, Humalog 75/25 60 units QAM, 50 units QPM Current orders for Inpatient glycemic control: 70/30 20 units BID, Novolog 0-15 units TID with meals  Inpatient Diabetes Program Recommendations:    Insulin: Lab glucose 63 mg/dl at 4:09 am today. Noted 70/30 decreased from 30 units BID to 20 units BID.   HbgA1C: A1C 9% on 06/29/23 indicating an average glucose of 212 mg/dl over the past 2-3 months.  Addendum 07/01/23@11 :45-Spoke with patient about diabetes and home regimen for diabetes control. Patient reports being followed by PCP for diabetes management and currently taking Humalog 75/25 60 units QAM and 50 units QPM, and Glipizide XL 5 mg QAM as an outpatient for diabetes control. Patient reports taking DM medications as prescribed. Patient reports that she has been sick since Thanksgiving and her glucose has been running higher since she has been sick and has been on Prednisone several times over the past 3 months. Patient reports that her glucose has been in the 200-300's mg/dl lately especially when taking steroids. Patient reports her last A1C was 8.6% at PCP and prior to that it was in the 7% range where it typically stays. Discussed current A1C of 9%  on 06/29/23 and explained that if she has been sick and on steroids over the past 3 months, that is contributing to higher A1C. Patient states that she rarely has hypoglycemia at home and she starts getting symptoms of hypoglycemia if glucose is in the 90's or less. Patient states that she had symptoms of hypoglycemia this morning when CBG was low.   Discussed glucose and A1C goals. Discussed importance of checking CBGs and maintaining good CBG control to prevent long-term and short-term complications.  Discussed impact of nutrition, exercise, stress, sickness, and medications on diabetes control.  Patient states she has all needed DM medications and supplies at home. Encouraged patient to continue to work with PCP regarding improving DM control and she may want to ask PCP about insulin regimen plan for times she is taking steroids as she may need more insulin when taking them.   Patient verbalized understanding of information discussed and reports no further questions at this time related to diabetes.  Thanks, Orlando Penner, RN, MSN, CDCES Diabetes Coordinator Inpatient Diabetes Program [phone] (Team Pager from 8am to 5pm)

## 2023-07-01 NOTE — Plan of Care (Signed)

## 2023-07-02 ENCOUNTER — Inpatient Hospital Stay (HOSPITAL_COMMUNITY): Payer: Medicare HMO

## 2023-07-02 DIAGNOSIS — J441 Chronic obstructive pulmonary disease with (acute) exacerbation: Secondary | ICD-10-CM

## 2023-07-02 DIAGNOSIS — J9621 Acute and chronic respiratory failure with hypoxia: Secondary | ICD-10-CM | POA: Diagnosis not present

## 2023-07-02 DIAGNOSIS — I5032 Chronic diastolic (congestive) heart failure: Secondary | ICD-10-CM | POA: Diagnosis not present

## 2023-07-02 DIAGNOSIS — J189 Pneumonia, unspecified organism: Secondary | ICD-10-CM | POA: Diagnosis not present

## 2023-07-02 LAB — RESPIRATORY PANEL BY PCR

## 2023-07-02 LAB — CBC
HCT: 42.5 % (ref 36.0–46.0)
Hemoglobin: 13.2 g/dL (ref 12.0–15.0)
MCH: 29.7 pg (ref 26.0–34.0)
MCHC: 31.1 g/dL (ref 30.0–36.0)
MCV: 95.7 fL (ref 80.0–100.0)
Platelets: 217 10*3/uL (ref 150–400)
RBC: 4.44 MIL/uL (ref 3.87–5.11)
RDW: 15.4 % (ref 11.5–15.5)
WBC: 7 10*3/uL (ref 4.0–10.5)
nRBC: 0 % (ref 0.0–0.2)

## 2023-07-02 LAB — GLUCOSE, CAPILLARY
Glucose-Capillary: 105 mg/dL — ABNORMAL HIGH (ref 70–99)
Glucose-Capillary: 67 mg/dL — ABNORMAL LOW (ref 70–99)
Glucose-Capillary: 84 mg/dL (ref 70–99)
Glucose-Capillary: 116 mg/dL — ABNORMAL HIGH (ref 70–99)
Glucose-Capillary: 85 mg/dL (ref 70–99)
Glucose-Capillary: 163 mg/dL — ABNORMAL HIGH (ref 70–99)

## 2023-07-02 LAB — BASIC METABOLIC PANEL
Anion gap: 9 (ref 5–15)
BUN: 19 mg/dL (ref 8–23)
CO2: 34 mmol/L — ABNORMAL HIGH (ref 22–32)
Calcium: 8.5 mg/dL — ABNORMAL LOW (ref 8.9–10.3)
Chloride: 95 mmol/L — ABNORMAL LOW (ref 98–111)
Creatinine, Ser: 0.98 mg/dL (ref 0.44–1.00)
GFR, Estimated: 59 mL/min — ABNORMAL LOW (ref >60)
Glucose, Bld: 107 mg/dL — ABNORMAL HIGH (ref 70–99)
Potassium: 3.8 mmol/L (ref 3.5–5.1)
Sodium: 138 mmol/L (ref 135–145)

## 2023-07-02 LAB — PROCALCITONIN: Procalcitonin: 0.36 ng/mL

## 2023-07-02 LAB — BRAIN NATRIURETIC PEPTIDE: B Natriuretic Peptide: 154 pg/mL — ABNORMAL HIGH (ref 0.0–100.0)

## 2023-07-02 LAB — MAGNESIUM: Magnesium: 1.9 mg/dL (ref 1.7–2.4)

## 2023-07-02 MED ORDER — IPRATROPIUM BROMIDE 0.02 % IN SOLN
0.5000 mg | Freq: Four times a day (QID) | RESPIRATORY_TRACT | Status: AC
  Administered 2023-07-02 – 2023-07-04 (×6): 0.5 mg via RESPIRATORY_TRACT
  Filled 2023-07-02 (×7): qty 2.5

## 2023-07-02 MED ORDER — INSULIN ASPART PROT & ASPART (70-30 MIX) 100 UNIT/ML ~~LOC~~ SUSP
10.0000 [IU] | Freq: Two times a day (BID) | SUBCUTANEOUS | Status: DC
  Administered 2023-07-02 – 2023-07-04 (×4): 10 [IU] via SUBCUTANEOUS

## 2023-07-02 MED ORDER — BUDESONIDE 0.25 MG/2ML IN SUSP
0.5000 mg | Freq: Two times a day (BID) | RESPIRATORY_TRACT | Status: DC
  Administered 2023-07-02 – 2023-07-03 (×3): 0.5 mg via RESPIRATORY_TRACT
  Administered 2023-07-04: 0.25 mg via RESPIRATORY_TRACT
  Administered 2023-07-04 – 2023-07-05 (×2): 0.5 mg via RESPIRATORY_TRACT
  Filled 2023-07-02 (×6): qty 4

## 2023-07-02 MED ORDER — AZITHROMYCIN 250 MG PO TABS
500.0000 mg | ORAL_TABLET | Freq: Every day | ORAL | Status: DC
  Administered 2023-07-02 – 2023-07-06 (×5): 500 mg via ORAL
  Filled 2023-07-02 (×5): qty 2

## 2023-07-02 MED ORDER — METHYLPREDNISOLONE SODIUM SUCC 125 MG IJ SOLR
60.0000 mg | Freq: Two times a day (BID) | INTRAMUSCULAR | Status: DC
  Administered 2023-07-03 – 2023-07-06 (×7): 60 mg via INTRAVENOUS
  Filled 2023-07-02 (×8): qty 2

## 2023-07-02 MED ORDER — METHYLPREDNISOLONE SODIUM SUCC 125 MG IJ SOLR
125.0000 mg | Freq: Once | INTRAMUSCULAR | Status: AC
  Administered 2023-07-02: 125 mg via INTRAVENOUS
  Filled 2023-07-02: qty 2

## 2023-07-02 MED ORDER — LEVALBUTEROL HCL 0.63 MG/3ML IN NEBU
0.6300 mg | INHALATION_SOLUTION | Freq: Four times a day (QID) | RESPIRATORY_TRACT | Status: DC
  Administered 2023-07-02 – 2023-07-07 (×19): 0.63 mg via RESPIRATORY_TRACT
  Filled 2023-07-02 (×19): qty 3

## 2023-07-02 NOTE — Progress Notes (Signed)
 PROGRESS NOTE  Ann [name] FMW:[id number] DOB: 1945-07-16 DOA: 06/28/2023 PCP: [name], MD  Brief History:  78 y.o. female with medical history significant of coronary disease, COPD, chronic hypoxic respiratory failure on 2 L oxygen ,  diastolic CHF, status post prior stents, persistent A-fib, on Eliquis , hypertension, GERD, chronic anticoagulation, history of DVT in the past, diabetes mellitus type 2, NS Vtach, insulin -dependent who presents to the emergency department due to several days of onset of shortness of breath which has been worsening, this was associated with increased cough.  Patient now presents with severe shortness of breath despite being on supplemental oxygen  and could barely take 1 or 2 steps without having to stop to take a breath.  Patient denies diarrhea or vomiting.  O2 sat on arrival to the ED was 80% on 2 LPM.  Patient was placed on 4 L nasal cannula with saturation in the mid 90s.   ED Course:  In the emergency department, she was tachypneic, pulse was 84 bpm on arrival to the ED, BP 106/88, O2 sat was 98 to 94% on supplemental oxygen  via [place] at 4 LPM.  Workup in the ED showed normal CBC except for WBC of 11.0, normal BMP.  Troponin 45 > 43.  Chest x-ray was suggestive of multifocal pneumonia Patient was treated with vancomycin  and Zosyn .  IV Solu-Medrol  25 mg x 1 was given.  Hospitalist was asked to admit patient for further evaluation and management.     Assessment/Plan:   Multifocal pneumonia Patient was started on vancomycin  and Zosyn  -d/c vanc as MRSA is neg -add azithromycin  Continue Tylenol  as needed Continue Mucinex , Robitussin, incentive spirometry, flutter valve  -PCT 0.36   Acute on chronic respiratory failure with hypoxia -due to pneumonia and COPD exacerbation Patient is currently on supplemental oxygen  4 LPM (she uses 2 LPM at home) Continue supplemental oxygen  to maintain O2 sats > 92% and consider weaning patient down to  home oxygen  requirement as tolerated. -CT chest   COPD exacerbation -add brovana  -increase pulmicort  -start IV solumedrol -add atrovent  nebs -viral respiratory panel   Elevated troponin Troponin 45 > 43 suspect mild elevation demand ischemia -no chest pain -due to demand ischemia   Morbid obesity (BMI 48.89) Patient was counseled about the cardiovascular and metabolic risk of morbid obesity. Patient was counseled for diet control, exercise regimen and weight loss.    Chronic diastolic CHF Continue total input/output, daily weights and fluid restriction Continue heart healthy diet      Echocardiogram 4/24: normal LVEF, indeterminate DD History of previous DVT Continue apixaban  for full anticoagulation    Chronic atrial fibrillation Continue Cardizem , bisoprolol , apixaban    CAD Patient denies chest pain Continue Cardizem , bisoprolol , apixaban     Acquired hypothyroidism Continue levothyroxine     Type 2 diabetes mellitus Hemoglobin A1c done on 06/25/2022 was 8.6 Continue ISS and hypoglycemia protocol 70/30 insulin  reduced further to 10units BID and HS snack added            Family Communication:   no Family at bedside  Consultants:  none  Code Status:  DNR  DVT Prophylaxis:  apixaban    Procedures: As Listed in Progress Note Above  Antibiotics: Zosyn  2/2>> Azithro 2/5>>      Subjective: Pt complains of cough and sob.  Denies f/c cp, n/v/d, abd pain  Objective: Vitals:   07/02/23 0828 07/02/23 0855 07/02/23 1112 07/02/23 1201  BP:  99/75 (!) 113/92 94/67  Pulse:  72 65   Resp:   19   Temp:  97.8 F (36.6 C) 98.1 F (36.7 C)   TempSrc:  Oral    SpO2: 98% 96% 91%   Weight:      Height:        Intake/Output Summary (Last 24 hours) at 07/02/2023 1326 Last data filed at 07/02/2023 0500 Gross per 24 hour  Intake 240 ml  Output 2400 ml  Net -2160 ml   Weight change: -0.4 kg Exam:  General:  Pt is alert, follows commands appropriately, not  in acute distress HEENT: No icterus, No thrush, No neck mass, [place]/AT Cardiovascular: RRR, S1/S2, no rubs, no gallops Respiratory: bilateral rales.  Bilateral wheeze Abdomen: Soft/+BS, non tender, non distended, no guarding Extremities: trace LE edema, No lymphangitis, No petechiae, No rashes, no synovitis   Data Reviewed: I have personally reviewed following labs and imaging studies Basic Metabolic Panel: Recent Labs  Lab 06/28/23 1725 06/29/23 0424 07/01/23 0343 07/02/23 0439  NA 143 140 140 138  K 4.5 4.5 3.8 3.8  CL 103 101 100 95*  CO2 30 27 31  34*  GLUCOSE 77 190* 63* 107*  BUN 18 22 25* 19  CREATININE 0.97 0.87 0.92 0.98  CALCIUM  9.1 8.9 8.8* 8.5*  MG  --  2.0  --  1.9  PHOS  --  4.2  --   --    Liver Function Tests: Recent Labs  Lab 06/29/23 0424  AST 23  ALT 17  ALKPHOS 59  BILITOT 0.9  PROT 6.9  ALBUMIN  3.2*   No results for input(s): LIPASE, AMYLASE in the last 168 hours. No results for input(s): AMMONIA in the last 168 hours. Coagulation Profile: No results for input(s): INR, PROTIME in the last 168 hours. CBC: Recent Labs  Lab 06/28/23 1725 06/29/23 0424 07/02/23 0439  WBC 11.0* 8.4 7.0  NEUTROABS 9.3*  --   --   HGB 14.0 13.5 13.2  HCT 44.8 45.0 42.5  MCV 95.7 96.4 95.7  PLT 197 176 217   Cardiac Enzymes: No results for input(s): CKTOTAL, CKMB, CKMBINDEX, TROPONINI in the last 168 hours. BNP: Invalid input(s): POCBNP CBG: Recent Labs  Lab 07/01/23 1603 07/01/23 2104 07/02/23 0319 07/02/23 0726 07/02/23 1110  GLUCAP 105* 93 67* 84 116*   HbA1C: No results for input(s): HGBA1C in the last 72 hours. Urine analysis:    Component Value Date/Time   COLORURINE YELLOW 03/21/2023 1345   APPEARANCEUR HAZY (A) 03/21/2023 1345   APPEARANCEUR Cloudy (A) 08/19/2022 1413   LABSPEC 1.010 03/21/2023 1345   PHURINE 7.0 03/21/2023 1345   GLUCOSEU 50 (A) 03/21/2023 1345   HGBUR NEGATIVE 03/21/2023 1345   BILIRUBINUR  NEGATIVE 03/21/2023 1345   BILIRUBINUR Negative 08/19/2022 1413   KETONESUR 5 (A) 03/21/2023 1345   PROTEINUR 100 (A) 03/21/2023 1345   UROBILINOGEN 0.2 03/15/2013 1504   NITRITE NEGATIVE 03/21/2023 1345   LEUKOCYTESUR NEGATIVE 03/21/2023 1345   Sepsis Labs: @LABRCNTIP (procalcitonin:4,lacticidven:4) ) Recent Results (from the past 240 hours)  Resp panel by RT-PCR (RSV, Flu A&B, Covid) Anterior Nasal Swab     Status: None   Collection Time: 06/28/23  5:03 PM   Specimen: Anterior Nasal Swab  Result Value Ref Range Status   SARS Coronavirus 2 by RT PCR NEGATIVE NEGATIVE Final    Comment: (NOTE) SARS-CoV-2 target nucleic acids are NOT DETECTED.  The SARS-CoV-2 RNA is generally detectable in upper respiratory specimens during the acute phase of infection. The lowest concentration of SARS-CoV-2 viral  copies this assay can detect is 138 copies/mL. A negative result does not preclude SARS-Cov-2 infection and should not be used as the sole basis for treatment or other patient management decisions. A negative result may occur with  improper specimen collection/handling, submission of specimen other than nasopharyngeal swab, presence of viral mutation(s) within the areas targeted by this assay, and inadequate number of viral copies(<138 copies/mL). A negative result must be combined with clinical observations, patient history, and epidemiological information. The expected result is Negative.  Fact Sheet for Patients:  bloggercourse.com  Fact Sheet for Healthcare Providers:  seriousbroker.it  This test is no t yet approved or cleared by the [place]  FDA and  has been authorized for detection and/or diagnosis of SARS-CoV-2 by FDA under an Emergency Use Authorization (EUA). This EUA will remain  in effect (meaning this test can be used) for the duration of the COVID-19 declaration under Section 564(b)(1) of the Act, 21 U.S.C.section  360bbb-3(b)(1), unless the authorization is terminated  or revoked sooner.       Influenza A by PCR NEGATIVE NEGATIVE Final   Influenza B by PCR NEGATIVE NEGATIVE Final    Comment: (NOTE) The Xpert Xpress SARS-CoV-2/FLU/RSV plus assay is intended as an aid in the diagnosis of influenza from Nasopharyngeal swab specimens and should not be used as a sole basis for treatment. Nasal washings and aspirates are unacceptable for Xpert Xpress SARS-CoV-2/FLU/RSV testing.  Fact Sheet for Patients: bloggercourse.com  Fact Sheet for Healthcare Providers: seriousbroker.it  This test is not yet approved or cleared by the [place]  FDA and has been authorized for detection and/or diagnosis of SARS-CoV-2 by FDA under an Emergency Use Authorization (EUA). This EUA will remain in effect (meaning this test can be used) for the duration of the COVID-19 declaration under Section 564(b)(1) of the Act, 21 U.S.C. section 360bbb-3(b)(1), unless the authorization is terminated or revoked.     Resp Syncytial Virus by PCR NEGATIVE NEGATIVE Final    Comment: (NOTE) Fact Sheet for Patients: bloggercourse.com  Fact Sheet for Healthcare Providers: seriousbroker.it  This test is not yet approved or cleared by the [place]  FDA and has been authorized for detection and/or diagnosis of SARS-CoV-2 by FDA under an Emergency Use Authorization (EUA). This EUA will remain in effect (meaning this test can be used) for the duration of the COVID-19 declaration under Section 564(b)(1) of the Act, 21 U.S.C. section 360bbb-3(b)(1), unless the authorization is terminated or revoked.  Performed at [facility], [address], [place], [place] [address]   Blood culture (routine x 2)     Status: None (Preliminary result)   Collection Time: 06/28/23  8:03 PM   Specimen: BLOOD RIGHT HAND  Result Value Ref Range  Status   Specimen Description BLOOD RIGHT HAND  Final   Special Requests   Final    BOTTLES DRAWN AEROBIC ONLY Blood Culture adequate volume   Culture   Final    NO GROWTH 4 DAYS Performed at [facility], [address], [place], [place] [address]    Report Status PENDING  Incomplete  Blood culture (routine x 2)     Status: None (Preliminary result)   Collection Time: 06/28/23  8:10 PM   Specimen: Left Antecubital; Blood  Result Value Ref Range Status   Specimen Description LEFT ANTECUBITAL  Final   Special Requests   Final    BOTTLES DRAWN AEROBIC ONLY Blood Culture adequate volume   Culture   Final    NO GROWTH 4  DAYS Performed at [facility], [address], [place], [place] [address]    Report Status PENDING  Incomplete  MRSA Next Gen by PCR, Nasal     Status: None   Collection Time: 06/29/23  7:56 AM   Specimen: Nasal Mucosa; Nasal Swab  Result Value Ref Range Status   MRSA by PCR Next Gen NOT DETECTED NOT DETECTED Final    Comment: (NOTE) The GeneXpert MRSA Assay (FDA approved for NASAL specimens only), is one component of a comprehensive MRSA colonization surveillance program. It is not intended to diagnose MRSA infection nor to guide or monitor treatment for MRSA infections. Test performance is not FDA approved in patients less than 52 years old. Performed at [facility], [address] [place], [place] [address]      Scheduled Meds:  apixaban   5 mg Oral BID   arformoterol   15 mcg Nebulization BID   atorvastatin   80 mg Oral Daily   azithromycin   500 mg Oral Daily   budesonide   0.5 mg Nebulization BID   dextromethorphan -guaiFENesin   1 tablet Oral BID   diltiazem   120 mg Oral Daily   ferrous sulfate   325 mg Oral Q breakfast   gabapentin   400 mg Oral QHS   insulin  aspart  0-15 Units Subcutaneous TID WC   insulin  aspart protamine- aspart  10 Units Subcutaneous BID AC   ipratropium  0.5 mg Nebulization Q6H   levalbuterol   0.63 mg Nebulization Q6H   levothyroxine    175 mcg Oral q morning   methylPREDNISolone  (SOLU-MEDROL ) injection  125 mg Intravenous Once   [START ON 07/03/2023] methylPREDNISolone  (SOLU-MEDROL ) injection  60 mg Intravenous Q12H   mirabegron  ER  25 mg Oral Daily   nystatin    Topical BID   pantoprazole   40 mg Oral BID   polyethylene glycol  17 g Oral BID   saccharomyces boulardii  250 mg Oral BID   Continuous Infusions:  piperacillin -tazobactam (ZOSYN )  IV 3.375 g (07/02/23 0436)    Procedures/Studies: DG Chest 2 View Result Date: 06/28/2023 CLINICAL DATA:  Cough, shortness of breath and chest pain for 1 week. EXAM: CHEST - 2 VIEW COMPARISON:  05/22/2023 FINDINGS: Exam detail is diminished secondary to patient's body habitus and suboptimal inspiration. Cardiac enlargement and aortic atherosclerosis. Enlargement of the pulmonary arteries compatible with PA hypertension. Opacities are noted within the periphery of the left midlung and both lung bases. Chronic coarsened interstitial markings noted bilaterally compatible with COPD/emphysema. Previous ACDF within the lower cervical spine. IMPRESSION: 1. Opacities within the periphery of the left midlung and both lung bases. Cannot exclude multifocal pneumonia. 2. COPD/emphysema. 3. Enlargement of the pulmonary arteries compatible with PA hypertension. Electronically Signed   By: Waddell Calk M.D.   On: 06/28/2023 17:47   US  ARTERIAL SEG MULTIPLE LE (ABI, SEGMENTAL PRESSURES, PVR'S) Result Date: 06/16/2023 CLINICAL DATA:  Peripheral vascular disease EXAM: NONINVASIVE PHYSIOLOGIC VASCULAR STUDY OF BILATERAL LOWER EXTREMITIES TECHNIQUE: Non-invasive vascular evaluation of both lower extremities was performed at rest, including calculation of ankle-brachial indices, multiple segmental pressure evaluation, segmental Doppler and segmental pulse volume recording. COMPARISON:  None Available. FINDINGS: Right Lower Extremity Resting ABI:  0.95 Resting TBI: 0.62 Segmental Pressures: Limited utility of  segmental pressures due to poor compressibility. Great toe pressure: 92 mm Hg Arterial Waveforms: Normal tri-phasic arterial waveforms. PVRs: Normal PVRs with maintained waveform amplitude, augmentation and quality. Left Lower Extremity: Resting ABI: 0.84 Resting TBI: 1.1 Segmental Pressures: Limited utility of segmental pressures due to poor compressibility. Great toe pressure:  168 mm Hg Arterial Waveforms: Normal tri-phasic arterial waveforms. PVRs: Normal PVRs with maintained waveform amplitude, augmentation and quality. Other: Symmetric upper extremity pressures. Ankle Brachial index > 1.4 Non diagnostic secondary to incompressible vessel calcifications 1.0-1.4       Normal 0.9-0.99     Borderline PAD 0.8-0.89     Mild PAD 0.5-0.79     Moderate PAD < 0.5          Severe PAD Toe Brachial Index Normal     >0.65 Moderate  0.53-0.64 Severe     <0.23 Toe Pressures Absolute toe pressure >58mmHg sufficient for wound healing. Toe pressures <60mmHg = critical limb ischemia. IMPRESSION: 1. Borderline decreased right ankle and toe brachial index consistent with borderline underlying peripheral arterial disease. 2. Mildly decreased resting left ankle-brachial index with a normal toe brachial index also consistent with borderline underlying peripheral arterial disease. 3. Limited utility of segment pressures due to noncompressibility of the vessels at multiple locations. This is likely due to patient body habitus. Electronically Signed   By: Wilkie Lent M.D.   On: 06/16/2023 13:55    Alm Schneider, DO  [facility]  If 7PM-7AM, please contact night-coverage [email or web address] Password [email or web address] 07/02/2023, 1:26 PM   LOS: 4 days

## 2023-07-02 NOTE — Inpatient Diabetes Management (Signed)
 Inpatient Diabetes Program Recommendations  AACE/ADA: New Consensus Statement on Inpatient Glycemic Control   Target Ranges:  Prepandial:   less than 140 mg/dL      Peak postprandial:   less than 180 mg/dL (1-2 hours)      Critically ill patients:  140 - 180 mg/dL    Latest Reference Range & Units 07/01/23 07:44 07/01/23 09:36 07/01/23 11:01 07/01/23 16:49 07/01/23 21:04 07/02/23 03:19  Glucose-Capillary 70 - 99 mg/dL 84   29/69 20 units 858 (H)  Novolog  2 units@12 :47   70/30 20 units 93 67 (L)    Review of Glycemic Control  Diabetes history: DM2 Outpatient Diabetes medications: Glipizide  XL 5 mg QAM, Humalog  75/25 60 units QAM, 50 units QPM Current orders for Inpatient glycemic control: 70/30 20 units BID, Novolog  0-15 units TID with meals   Inpatient Diabetes Program Recommendations:     Insulin : CBG 67 mg/dl at 6:80 am today. Please decrease 70/30 to 10 units BID.   HbgA1C: A1C 9% on 06/29/23 indicating an average glucose of 212 mg/dl over the past 2-3 months.  Thanks, Ann Gainer, RN, MSN, CDCES Diabetes Coordinator Inpatient Diabetes Program [phone] (Team Pager from 8am to 5pm)

## 2023-07-02 NOTE — Progress Notes (Addendum)
 Patient placed on Droplet precaution per Infection control policy and per Dr Tat orders.Patient was educated,also carenotes given on Droplet precaution,patient and family verbalized understanding.Plan of care on going .Aaron Aas

## 2023-07-02 NOTE — Plan of Care (Signed)
  Problem: Fluid Volume: Goal: Ability to maintain a balanced intake and output will improve Outcome: Progressing   Problem: Fluid Volume: Goal: Ability to maintain a balanced intake and output will improve Outcome: Progressing

## 2023-07-02 NOTE — Progress Notes (Signed)
 Nurse at bedside,patient c/o dizziness,and cough,blood pressure 94/67,heart rate 90.Dr Tat notified.plan of care on going.

## 2023-07-02 NOTE — TOC Progression Note (Signed)
 Transition of Care [facility]) - Progression Note    Patient Details  Name: Ann [name] MRN: [id number] Date of Birth: Mar 15, 1946  [facility]) CM/SW Contact  [place] Saddie Kim, [place] Phone Number: 07/02/2023, 12:07 PM  Clinical Narrative:  Pt agreeable to HHPT as recommended by PT. No preference on agency. Referred and accepted by Dorothe with [facility]. Will need orders.      Expected Discharge Plan: Home/Self Care Barriers to Discharge: Continued Medical Work up  Expected Discharge Plan and Services In-house Referral: Clinical Social Work     Living arrangements for the past 2 months: Single Family Home                                       Social Determinants of Health (SDOH) Interventions SDOH Screenings   Food Insecurity: No Food Insecurity (06/29/2023)  Housing: Low Risk  (06/29/2023)  Transportation Needs: No Transportation Needs (06/29/2023)  Utilities: Not At Risk (06/29/2023)  Alcohol  Screen: Low Risk  (10/22/2022)  Depression (PHQ2-9): Low Risk  (10/30/2022)  Recent Concern: Depression (PHQ2-9) - High Risk (10/16/2022)  Financial Resource Strain: Low Risk  (10/22/2022)  Physical Activity: Insufficiently Active (10/22/2022)  Social Connections: Unknown (06/29/2023)  Stress: No Stress Concern Present (10/22/2022)  Tobacco Use: Medium Risk (06/28/2023)    Readmission Risk Interventions    06/30/2023    2:53 PM 03/22/2023    1:49 PM 03/17/2023    5:19 PM  Readmission Risk Prevention Plan  Transportation Screening Complete  Complete  PCP or Specialist Appt within 3-5 Days  Complete   HRI or Home Care Consult Complete Complete Complete  Social Work Consult for Recovery Care Planning/Counseling Complete Complete Complete  Palliative Care Screening Not Applicable Complete Not Applicable  Medication Review Oceanographer) Complete Complete Complete

## 2023-07-02 NOTE — Progress Notes (Signed)
 Nurse at bedside,patient alert and oriented to person,place,and situation,confused to time.Exp wheezing note to lung fields.Blood pressure 99/75,Dr Tat notified. Holding zebeta ,and lasix  per MD's order.Plan of care on going.

## 2023-07-03 DIAGNOSIS — I5032 Chronic diastolic (congestive) heart failure: Secondary | ICD-10-CM | POA: Diagnosis not present

## 2023-07-03 DIAGNOSIS — J189 Pneumonia, unspecified organism: Secondary | ICD-10-CM | POA: Diagnosis not present

## 2023-07-03 DIAGNOSIS — J9621 Acute and chronic respiratory failure with hypoxia: Secondary | ICD-10-CM | POA: Diagnosis not present

## 2023-07-03 LAB — GLUCOSE, CAPILLARY
Glucose-Capillary: 195 mg/dL — ABNORMAL HIGH (ref 70–99)
Glucose-Capillary: 203 mg/dL — ABNORMAL HIGH (ref 70–99)
Glucose-Capillary: 229 mg/dL — ABNORMAL HIGH (ref 70–99)
Glucose-Capillary: 200 mg/dL — ABNORMAL HIGH (ref 70–99)
Glucose-Capillary: 337 mg/dL — ABNORMAL HIGH (ref 70–99)

## 2023-07-03 LAB — CULTURE, BLOOD (ROUTINE X 2)
Special Requests: ADEQUATE
Special Requests: ADEQUATE

## 2023-07-03 MED ORDER — REVEFENACIN 175 MCG/3ML IN SOLN
175.0000 ug | Freq: Every day | RESPIRATORY_TRACT | Status: DC
  Administered 2023-07-04 – 2023-07-07 (×4): 175 ug via RESPIRATORY_TRACT
  Filled 2023-07-03 (×4): qty 3

## 2023-07-03 NOTE — Plan of Care (Signed)
   Problem: Education: Goal: Ability to describe self-care measures that may prevent or decrease complications (Diabetes Survival Skills Education) will improve Outcome: Progressing Goal: Individualized Educational Video(s) Outcome: Progressing   Problem: Coping: Goal: Ability to adjust to condition or change in health will improve Outcome: Progressing

## 2023-07-03 NOTE — Progress Notes (Signed)
 PROGRESS NOTE  Ann [name] FMW:[id number] DOB: [birth year] DOA: 06/28/2023 PCP: [name], MD  Brief History:  78 y.o. female with medical history significant of coronary disease, COPD, chronic hypoxic respiratory failure on 2 L oxygen ,  diastolic CHF, status post prior stents, persistent A-fib, on Eliquis , hypertension, GERD, chronic anticoagulation, history of DVT in the past, diabetes mellitus type 2, NS Vtach, insulin -dependent who presents to the emergency department due to several days of onset of shortness of breath which has been worsening, this was associated with increased cough.  Patient now presents with severe shortness of breath despite being on supplemental oxygen  and could barely take 1 or 2 steps without having to stop to take a breath.  Patient denies diarrhea or vomiting.  O2 sat on arrival to the ED was 80% on 2 LPM.  Patient was placed on 4 L nasal cannula with saturation in the mid 90s.   ED Course:  In the emergency department, she was tachypneic, pulse was 84 bpm on arrival to the ED, BP 106/88, O2 sat was 98 to 94% on supplemental oxygen  via [place] at 4 LPM.  Workup in the ED showed normal CBC except for WBC of 11.0, normal BMP.  Troponin 45 > 43.  Chest x-ray was suggestive of multifocal pneumonia Patient was treated with vancomycin  and Zosyn .  IV Solu-Medrol  25 mg x 1 was given.  Hospitalist was asked to admit patient for further evaluation and management.     Assessment/Plan: Multifocal pneumonia Patient was started on vancomycin  and Zosyn  -d/c vanc as MRSA is neg -added azithromycin  Continue Tylenol  as needed Continue Mucinex , Robitussin, incentive spirometry, flutter valve  -PCT 0.36   Acute on chronic respiratory failure with hypoxia -due to pneumonia and COPD exacerbation Patient is currently on supplemental oxygen  3 LPM (she uses 2 LPM at home) Continue supplemental oxygen  to maintain O2 sats > 90% and consider weaning patient down to  home oxygen  requirement as tolerated. -CT chest--substantial atelectasis RML, hazy density RML; LLL airway plugging; 5 mm RUL nodule; subacute R-4th rib fx -chest physiotherapy   COPD exacerbation -added brovana  -increased pulmicort  -start IV solumedrol -add atrovent  nebs>>Yupelri  -viral respiratory panel +metapneumovirus   Elevated troponin Troponin 45 > 43 suspect mild elevation demand ischemia -no chest pain -due to demand ischemia   Morbid obesity (BMI 48.89) Patient was counseled about the cardiovascular and metabolic risk of morbid obesity. Patient was counseled for diet control, exercise regimen and weight loss.    Chronic diastolic CHF Continue total input/output, daily weights and fluid restriction Continue heart healthy diet      Echocardiogram 4/24: normal LVEF, indeterminate DD History of previous DVT Continue apixaban  for full anticoagulation  -lasix  on hold temporarily due to rise in creatinine and soft BPs   Chronic atrial fibrillation Continue Cardizem ,  apixaban  -holding bisoprolol  due to soft BPs   CAD Patient denies chest pain Continue Cardizem , apixaban   -holding bisoprolol  due to soft BPs   Acquired hypothyroidism Continue levothyroxine     Type 2 diabetes mellitus Hemoglobin A1c done on 06/25/2022 was 8.6 Continue ISS  -70/30 insulin  -- anticipate adjustment needed with initiation of steroids                   Family Communication:   no Family at bedside   Consultants:  none   Code Status:  DNR   DVT Prophylaxis:  apixaban      Procedures: As Listed in Progress  Note Above   Antibiotics: Zosyn  2/2>> Azithro 2/5>>            Subjective:  Pt breathing better.  Complains of generalized weakness.  Denies f/c, vomiting diarrhea, abd pain Objective: Vitals:   07/03/23 0800 07/03/23 0801 07/03/23 0804 07/03/23 0845  BP:    (!) 145/97  Pulse:    60  Resp:      Temp:    97.8 F (36.6 C)  TempSrc:    Oral  SpO2: 92%  92% 92% 90%  Weight:      Height:        Intake/Output Summary (Last 24 hours) at 07/03/2023 1220 Last data filed at 07/03/2023 0900 Gross per 24 hour  Intake 1020 ml  Output 801 ml  Net 219 ml   Weight change: 2.8 kg Exam:  General:  Pt is alert, follows commands appropriately, not in acute distress HEENT: No icterus, No thrush, No neck mass, [place]/AT Cardiovascular: RRR, S1/S2, no rubs, no gallops Respiratory: bilateral rales.  Bilateral wheeze Abdomen: Soft/+BS, non tender, non distended, no guarding Extremities: No edema, No lymphangitis, No petechiae, No rashes, no synovitis   Data Reviewed: I have personally reviewed following labs and imaging studies Basic Metabolic Panel: Recent Labs  Lab 06/28/23 1725 06/29/23 0424 07/01/23 0343 07/02/23 0439  NA 143 140 140 138  K 4.5 4.5 3.8 3.8  CL 103 101 100 95*  CO2 30 27 31  34*  GLUCOSE 77 190* 63* 107*  BUN 18 22 25* 19  CREATININE 0.97 0.87 0.92 0.98  CALCIUM  9.1 8.9 8.8* 8.5*  MG  --  2.0  --  1.9  PHOS  --  4.2  --   --    Liver Function Tests: Recent Labs  Lab 06/29/23 0424  AST 23  ALT 17  ALKPHOS 59  BILITOT 0.9  PROT 6.9  ALBUMIN  3.2*   No results for input(s): LIPASE, AMYLASE in the last 168 hours. No results for input(s): AMMONIA in the last 168 hours. Coagulation Profile: No results for input(s): INR, PROTIME in the last 168 hours. CBC: Recent Labs  Lab 06/28/23 1725 06/29/23 0424 07/02/23 0439  WBC 11.0* 8.4 7.0  NEUTROABS 9.3*  --   --   HGB 14.0 13.5 13.2  HCT 44.8 45.0 42.5  MCV 95.7 96.4 95.7  PLT 197 176 217   Cardiac Enzymes: No results for input(s): CKTOTAL, CKMB, CKMBINDEX, TROPONINI in the last 168 hours. BNP: Invalid input(s): POCBNP CBG: Recent Labs  Lab 07/02/23 1635 07/02/23 2146 07/03/23 0323 07/03/23 0741 07/03/23 1116  GLUCAP 85 163* 195* 203* 229*   HbA1C: No results for input(s): HGBA1C in the last 72 hours. Urine analysis:     Component Value Date/Time   COLORURINE YELLOW 03/21/2023 1345   APPEARANCEUR HAZY (A) 03/21/2023 1345   APPEARANCEUR Cloudy (A) 08/19/2022 1413   LABSPEC 1.010 03/21/2023 1345   PHURINE 7.0 03/21/2023 1345   GLUCOSEU 50 (A) 03/21/2023 1345   HGBUR NEGATIVE 03/21/2023 1345   BILIRUBINUR NEGATIVE 03/21/2023 1345   BILIRUBINUR Negative 08/19/2022 1413   KETONESUR 5 (A) 03/21/2023 1345   PROTEINUR 100 (A) 03/21/2023 1345   UROBILINOGEN 0.2 03/15/2013 1504   NITRITE NEGATIVE 03/21/2023 1345   LEUKOCYTESUR NEGATIVE 03/21/2023 1345   Sepsis Labs: @LABRCNTIP (procalcitonin:4,lacticidven:4) ) Recent Results (from the past 240 hours)  Resp panel by RT-PCR (RSV, Flu A&B, Covid) Anterior Nasal Swab     Status: None   Collection Time: 06/28/23  5:03 PM   Specimen:  Anterior Nasal Swab  Result Value Ref Range Status   SARS Coronavirus 2 by RT PCR NEGATIVE NEGATIVE Final    Comment: (NOTE) SARS-CoV-2 target nucleic acids are NOT DETECTED.  The SARS-CoV-2 RNA is generally detectable in upper respiratory specimens during the acute phase of infection. The lowest concentration of SARS-CoV-2 viral copies this assay can detect is 138 copies/mL. A negative result does not preclude SARS-Cov-2 infection and should not be used as the sole basis for treatment or other patient management decisions. A negative result may occur with  improper specimen collection/handling, submission of specimen other than nasopharyngeal swab, presence of viral mutation(s) within the areas targeted by this assay, and inadequate number of viral copies(<138 copies/mL). A negative result must be combined with clinical observations, patient history, and epidemiological information. The expected result is Negative.  Fact Sheet for Patients:  bloggercourse.com  Fact Sheet for Healthcare Providers:  seriousbroker.it  This test is no t yet approved or cleared by the [place]  [place] FDA and  has been authorized for detection and/or diagnosis of SARS-CoV-2 by FDA under an Emergency Use Authorization (EUA). This EUA will remain  in effect (meaning this test can be used) for the duration of the COVID-19 declaration under Section 564(b)(1) of the Act, 21 U.S.C.section 360bbb-3(b)(1), unless the authorization is terminated  or revoked sooner.       Influenza A by PCR NEGATIVE NEGATIVE Final   Influenza B by PCR NEGATIVE NEGATIVE Final    Comment: (NOTE) The Xpert Xpress SARS-CoV-2/FLU/RSV plus assay is intended as an aid in the diagnosis of influenza from Nasopharyngeal swab specimens and should not be used as a sole basis for treatment. Nasal washings and aspirates are unacceptable for Xpert Xpress SARS-CoV-2/FLU/RSV testing.  Fact Sheet for Patients: bloggercourse.com  Fact Sheet for Healthcare Providers: seriousbroker.it  This test is not yet approved or cleared by the [place]  FDA and has been authorized for detection and/or diagnosis of SARS-CoV-2 by FDA under an Emergency Use Authorization (EUA). This EUA will remain in effect (meaning this test can be used) for the duration of the COVID-19 declaration under Section 564(b)(1) of the Act, 21 U.S.C. section 360bbb-3(b)(1), unless the authorization is terminated or revoked.     Resp Syncytial Virus by PCR NEGATIVE NEGATIVE Final    Comment: (NOTE) Fact Sheet for Patients: bloggercourse.com  Fact Sheet for Healthcare Providers: seriousbroker.it  This test is not yet approved or cleared by the [place]  FDA and has been authorized for detection and/or diagnosis of SARS-CoV-2 by FDA under an Emergency Use Authorization (EUA). This EUA will remain in effect (meaning this test can be used) for the duration of the COVID-19 declaration under Section 564(b)(1) of the Act, 21 U.S.C. section  360bbb-3(b)(1), unless the authorization is terminated or revoked.  Performed at [facility], [address], [place], [place] [address]   Blood culture (routine x 2)     Status: None   Collection Time: 06/28/23  8:03 PM   Specimen: BLOOD RIGHT HAND  Result Value Ref Range Status   Specimen Description BLOOD RIGHT HAND  Final   Special Requests   Final    BOTTLES DRAWN AEROBIC ONLY Blood Culture adequate volume   Culture   Final    NO GROWTH 5 DAYS Performed at [facility], [address] [place], [place] [address]    Report Status 07/03/2023 FINAL  Final  Blood culture (routine x 2)     Status: None   Collection Time: 06/28/23  8:10 PM   Specimen: Left Antecubital; Blood  Result Value Ref Range Status   Specimen Description LEFT ANTECUBITAL  Final   Special Requests   Final    BOTTLES DRAWN AEROBIC ONLY Blood Culture adequate volume   Culture   Final    NO GROWTH 5 DAYS Performed at [facility], [address], [place], [place] [address]    Report Status 07/03/2023 FINAL  Final  MRSA Next Gen by PCR, Nasal     Status: None   Collection Time: 06/29/23  7:56 AM   Specimen: Nasal Mucosa; Nasal Swab  Result Value Ref Range Status   MRSA by PCR Next Gen NOT DETECTED NOT DETECTED Final    Comment: (NOTE) The GeneXpert MRSA Assay (FDA approved for NASAL specimens only), is one component of a comprehensive MRSA colonization surveillance program. It is not intended to diagnose MRSA infection nor to guide or monitor treatment for MRSA infections. Test performance is not FDA approved in patients less than 4 years old. Performed at [facility], [address], [place], [place] [address]   Respiratory (~20 pathogens) panel by PCR     Status: Abnormal   Collection Time: 07/02/23  3:20 PM   Specimen: Nasopharyngeal Swab; Respiratory  Result Value Ref Range Status   Adenovirus NOT DETECTED NOT DETECTED Final   Coronavirus 229E NOT DETECTED NOT DETECTED Final    Comment: (NOTE) The  Coronavirus on the Respiratory Panel, DOES NOT test for the novel  Coronavirus (2019 nCoV)    Coronavirus HKU1 NOT DETECTED NOT DETECTED Final   Coronavirus NL63 NOT DETECTED NOT DETECTED Final   Coronavirus OC43 NOT DETECTED NOT DETECTED Final   Metapneumovirus DETECTED (A) NOT DETECTED Final   Rhinovirus / Enterovirus NOT DETECTED NOT DETECTED Final   Influenza A NOT DETECTED NOT DETECTED Final   Influenza B NOT DETECTED NOT DETECTED Final   Parainfluenza Virus 1 NOT DETECTED NOT DETECTED Final   Parainfluenza Virus 2 NOT DETECTED NOT DETECTED Final   Parainfluenza Virus 3 NOT DETECTED NOT DETECTED Final   Parainfluenza Virus 4 NOT DETECTED NOT DETECTED Final   Respiratory Syncytial Virus NOT DETECTED NOT DETECTED Final   Bordetella pertussis NOT DETECTED NOT DETECTED Final   Bordetella Parapertussis NOT DETECTED NOT DETECTED Final   Chlamydophila pneumoniae NOT DETECTED NOT DETECTED Final   Mycoplasma pneumoniae NOT DETECTED NOT DETECTED Final    Comment: Performed at [facility], [address] [place], [place] [address]     Scheduled Meds:  apixaban   5 mg Oral BID   arformoterol   15 mcg Nebulization BID   atorvastatin   80 mg Oral Daily   azithromycin   500 mg Oral Daily   budesonide   0.5 mg Nebulization BID   dextromethorphan -guaiFENesin   1 tablet Oral BID   diltiazem   120 mg Oral Daily   ferrous sulfate   325 mg Oral Q breakfast   gabapentin   400 mg Oral QHS   insulin  aspart  0-15 Units Subcutaneous TID WC   insulin  aspart protamine- aspart  10 Units Subcutaneous BID AC   ipratropium  0.5 mg Nebulization Q6H   levalbuterol   0.63 mg Nebulization Q6H   levothyroxine   175 mcg Oral q morning   methylPREDNISolone  (SOLU-MEDROL ) injection  60 mg Intravenous Q12H   mirabegron  ER  25 mg Oral Daily   nystatin    Topical BID   pantoprazole   40 mg Oral BID   polyethylene glycol  17 g Oral BID   [START ON 07/04/2023] revefenacin   175 mcg Nebulization Daily   saccharomyces  boulardii  250 mg Oral BID   Continuous Infusions:  piperacillin -tazobactam (ZOSYN )  IV 3.375 g (07/03/23 0605)    Procedures/Studies: CT CHEST WO CONTRAST Result Date: 07/02/2023 CLINICAL DATA:  Cough, shortness of breath, and chest pain for 1 week. EXAM: CT CHEST WITHOUT CONTRAST TECHNIQUE: Multidetector CT imaging of the chest was performed following the standard protocol without IV contrast. RADIATION DOSE REDUCTION: This exam was performed according to the departmental dose-optimization program which includes automated exposure control, adjustment of the mA and/or kV according to patient size and/or use of iterative reconstruction technique. COMPARISON:  12/14/2021 FINDINGS: Cardiovascular: Coronary, aortic arch, and branch vessel atherosclerotic vascular disease. Mild cardiomegaly. Dilated main pulmonary artery at 4.8 cm, favoring pulmonary arterial hypertension. Small amount of non dependent gas in the right atrium, most commonly introduced via IV line. In the absence of a right to left shunt this is typically clinically inconsequential. Mediastinum/Nodes: Stable asymmetry of the thyroid  with the right lobe larger than the left. No well-defined mass on today's noncontrast imaging. No pathologic adenopathy. Lungs/Pleura: Part solid 5 mm right apical segment right upper lobe pulmonary nodule, image 32 series 3, not present previously. Substantial atelectasis most of the right middle lobe. Hazy density in the aerated portion of the right middle lobe may likewise be from atelectasis, or alveolitis. There is airway plugging causing occlusion of the right middle lobe bronchi. Atelectasis posteriorly in the right upper lobe. Bandlike atelectasis in the superior segment right lower lobe. Dependent mucus in the right lower lobe bronchus. Mild scarring in the left upper lobe and lingula. Airway plugging in atelectasis in much of the left lower lobe especially medially and posteriorly. Upper Abdomen: Slightly  nodular severe liver contour, cannot exclude cirrhosis. Cholecystectomy. Abdominal aortic atherosclerosis. Musculoskeletal: Lower cervical plate and screw fixator. Degenerative right glenohumeral arthropathy. Subacute healing fracture of the right anterior fourth rib. Thoracic spondylosis with multilevel bridging spurring in the thoracic spine anteriorly. IMPRESSION: 1. Substantial atelectasis most of the right middle lobe. Hazy density in the aerated portion of the right middle lobe may likewise be from atelectasis, or alveolitis. There is airway plugging causing occlusion of the right middle lobe bronchi. 2. Airway plugging in atelectasis in much of the left lower lobe especially medially and posteriorly. 3. Dependent mucus in the right lower lobe bronchus. 4. Dilated main pulmonary artery at 4.8 cm, favoring pulmonary arterial hypertension. 5. Coronary, aortic arch, and branch vessel atherosclerotic vascular disease. 6. Mild cardiomegaly. 7. Slightly nodular severe liver contour, cannot exclude cirrhosis. 8. Subacute healing fracture of the right anterior fourth rib. 9.  Aortic Atherosclerosis (ICD10-I70.0). 10. Part solid 5 mm right apical segment right upper lobe pulmonary nodule not present previously. No follow-up recommended. This recommendation follows the consensus statement: Guidelines for Management of Incidental Pulmonary Nodules Detected on CT Images: From the Fleischner Society 2017; Radiology 2017; 284:228-243. Electronically Signed   By: Ryan Salvage M.D.   On: 07/02/2023 18:36   DG Chest 2 View Result Date: 06/28/2023 CLINICAL DATA:  Cough, shortness of breath and chest pain for 1 week. EXAM: CHEST - 2 VIEW COMPARISON:  05/22/2023 FINDINGS: Exam detail is diminished secondary to patient's body habitus and suboptimal inspiration. Cardiac enlargement and aortic atherosclerosis. Enlargement of the pulmonary arteries compatible with PA hypertension. Opacities are noted within the periphery of  the left midlung and both lung bases. Chronic coarsened interstitial markings noted bilaterally compatible with COPD/emphysema. Previous ACDF within the lower cervical spine. IMPRESSION:  1. Opacities within the periphery of the left midlung and both lung bases. Cannot exclude multifocal pneumonia. 2. COPD/emphysema. 3. Enlargement of the pulmonary arteries compatible with PA hypertension. Electronically Signed   By: Waddell Calk M.D.   On: 06/28/2023 17:47   US  ARTERIAL SEG MULTIPLE LE (ABI, SEGMENTAL PRESSURES, PVR'S) Result Date: 06/16/2023 CLINICAL DATA:  Peripheral vascular disease EXAM: NONINVASIVE PHYSIOLOGIC VASCULAR STUDY OF BILATERAL LOWER EXTREMITIES TECHNIQUE: Non-invasive vascular evaluation of both lower extremities was performed at rest, including calculation of ankle-brachial indices, multiple segmental pressure evaluation, segmental Doppler and segmental pulse volume recording. COMPARISON:  None Available. FINDINGS: Right Lower Extremity Resting ABI:  0.95 Resting TBI: 0.62 Segmental Pressures: Limited utility of segmental pressures due to poor compressibility. Great toe pressure: 92 mm Hg Arterial Waveforms: Normal tri-phasic arterial waveforms. PVRs: Normal PVRs with maintained waveform amplitude, augmentation and quality. Left Lower Extremity: Resting ABI: 0.84 Resting TBI: 1.1 Segmental Pressures: Limited utility of segmental pressures due to poor compressibility. Great toe pressure: 168 mm Hg Arterial Waveforms: Normal tri-phasic arterial waveforms. PVRs: Normal PVRs with maintained waveform amplitude, augmentation and quality. Other: Symmetric upper extremity pressures. Ankle Brachial index > 1.4 Non diagnostic secondary to incompressible vessel calcifications 1.0-1.4       Normal 0.9-0.99     Borderline PAD 0.8-0.89     Mild PAD 0.5-0.79     Moderate PAD < 0.5          Severe PAD Toe Brachial Index Normal     >0.65 Moderate  0.53-0.64 Severe     <0.23 Toe Pressures Absolute toe pressure  >27mmHg sufficient for wound healing. Toe pressures <46mmHg = critical limb ischemia. IMPRESSION: 1. Borderline decreased right ankle and toe brachial index consistent with borderline underlying peripheral arterial disease. 2. Mildly decreased resting left ankle-brachial index with a normal toe brachial index also consistent with borderline underlying peripheral arterial disease. 3. Limited utility of segment pressures due to noncompressibility of the vessels at multiple locations. This is likely due to patient body habitus. Electronically Signed   By: Wilkie Lent M.D.   On: 06/16/2023 13:55    Alm Schneider, DO  [facility]  If 7PM-7AM, please contact night-coverage [email or web address] Password [email or web address] 07/03/2023, 12:20 PM   LOS: 5 days

## 2023-07-03 NOTE — Plan of Care (Signed)
   Problem: Nutritional: Goal: Maintenance of adequate nutrition will improve Outcome: Progressing

## 2023-07-03 NOTE — Progress Notes (Signed)
 Mobility Specialist Progress Note:    07/03/23 1000  Mobility  Activity Refused mobility   Pt politely refused mobility, stated I just feel out of it. All needs met.   Sherrilee Ditty Mobility Specialist Please contact via Special Educational Needs Teacher or  Rehab office at [phone]

## 2023-07-03 NOTE — Progress Notes (Signed)
 Nurse at bedside this am,patient alert and oriented to person,place,and situation,confused to time. Inspiratory and Expiratory wheezing heard in lung fields.Patient stated "I'm coughing but nothing is coming up".Plan of care on going.

## 2023-07-04 ENCOUNTER — Inpatient Hospital Stay (HOSPITAL_COMMUNITY): Payer: Medicare HMO

## 2023-07-04 DIAGNOSIS — J9621 Acute and chronic respiratory failure with hypoxia: Secondary | ICD-10-CM | POA: Diagnosis not present

## 2023-07-04 DIAGNOSIS — J189 Pneumonia, unspecified organism: Secondary | ICD-10-CM | POA: Diagnosis not present

## 2023-07-04 DIAGNOSIS — I482 Chronic atrial fibrillation, unspecified: Secondary | ICD-10-CM | POA: Diagnosis not present

## 2023-07-04 LAB — GLUCOSE, CAPILLARY
Glucose-Capillary: 242 mg/dL — ABNORMAL HIGH (ref 70–99)
Glucose-Capillary: 272 mg/dL — ABNORMAL HIGH (ref 70–99)
Glucose-Capillary: 273 mg/dL — ABNORMAL HIGH (ref 70–99)
Glucose-Capillary: 187 mg/dL — ABNORMAL HIGH (ref 70–99)
Glucose-Capillary: 238 mg/dL — ABNORMAL HIGH (ref 70–99)

## 2023-07-04 LAB — BASIC METABOLIC PANEL
Anion gap: 10 (ref 5–15)
BUN: 25 mg/dL — ABNORMAL HIGH (ref 8–23)
CO2: 31 mmol/L (ref 22–32)
Calcium: 9.2 mg/dL (ref 8.9–10.3)
Chloride: 93 mmol/L — ABNORMAL LOW (ref 98–111)
Creatinine, Ser: 0.91 mg/dL (ref 0.44–1.00)
GFR, Estimated: >60 mL/min (ref >60)
Glucose, Bld: 253 mg/dL — ABNORMAL HIGH (ref 70–99)
Potassium: 3.9 mmol/L (ref 3.5–5.1)
Sodium: 134 mmol/L — ABNORMAL LOW (ref 135–145)

## 2023-07-04 LAB — MAGNESIUM: Magnesium: 2.1 mg/dL (ref 1.7–2.4)

## 2023-07-04 MED ORDER — INSULIN ASPART PROT & ASPART (70-30 MIX) 100 UNIT/ML ~~LOC~~ SUSP
15.0000 [IU] | Freq: Two times a day (BID) | SUBCUTANEOUS | Status: DC
  Administered 2023-07-04 – 2023-07-06 (×4): 15 [IU] via SUBCUTANEOUS

## 2023-07-04 MED ORDER — METOPROLOL TARTRATE 5 MG/5ML IV SOLN
2.5000 mg | Freq: Once | INTRAVENOUS | Status: AC
  Administered 2023-07-04: 2.5 mg via INTRAVENOUS
  Filled 2023-07-04: qty 5

## 2023-07-04 MED ORDER — BISOPROLOL FUMARATE 5 MG PO TABS
5.0000 mg | ORAL_TABLET | Freq: Every day | ORAL | Status: DC
Start: 2023-07-04 — End: 2023-07-07
  Administered 2023-07-04 – 2023-07-07 (×4): 5 mg via ORAL
  Filled 2023-07-04 (×4): qty 1

## 2023-07-04 MED ORDER — FUROSEMIDE 40 MG PO TABS
40.0000 mg | ORAL_TABLET | Freq: Two times a day (BID) | ORAL | Status: DC
  Administered 2023-07-04 – 2023-07-07 (×6): 40 mg via ORAL
  Filled 2023-07-04 (×6): qty 1

## 2023-07-04 NOTE — Consult Note (Signed)
 [facility] Liaison Note  07/04/2023  Ann [name] May 14, 1946 [id number]  Location: RN Hospital Liaison screened the patient remotely at [facility].  Insurance: [employer]vant[employer]   [name] is a 78 y.o. female who is a Primary Care Patient of Fanta, Tesfaye Demissie, MD [phone]).The patient was screened for readmission hospitalization with noted high risk score for unplanned readmission risk with 3 IP in 6 months.  The patient was assessed for potential Care Management service needs for post hospital transition for care coordination. Review of patient's electronic medical record reveals patient was admitted Multifocal pneumonia. Recommended for HHPT arranged with [facility]. Liaison unsuccessful outreach call to [facility]. Due to risk will make a referral for VBCI care management services closer to discharge.  Plan: [facility] Liaison will continue to follow progress and disposition to asess for post hospital community care coordination/management needs.  Referral request for community care coordination: pending disposition.   VBCI Care Management/Population Health does not replace or interfere with any arrangements made by the Inpatient Transition of Care team.   For questions contact:   Olam Ku, RN, [facility] Liaison [place]   [facility], Population Health Office Hours MTWF  8:00 am-6:00 pm Direct Dial: [phone] mobile [phone] [Office toll free line] Office Hours are M-F 8:30 - 5 pm [email or web address]

## 2023-07-04 NOTE — Plan of Care (Signed)
  Problem: Coping: Goal: Ability to adjust to condition or change in health will improve Outcome: Progressing   Problem: Fluid Volume: Goal: Ability to maintain a balanced intake and output will improve Outcome: Progressing   Problem: Health Behavior/Discharge Planning: Goal: Ability to identify and utilize available resources and services will improve Outcome: Progressing Goal: Ability to manage health-related needs will improve Outcome: Progressing   Problem: Nutritional: Goal: Maintenance of adequate nutrition will improve Outcome: Progressing   Problem: Tissue Perfusion: Goal: Adequacy of tissue perfusion will improve Outcome: Not Met (add Reason)

## 2023-07-04 NOTE — Inpatient Diabetes Management (Signed)
 Inpatient Diabetes Program Recommendations  AACE/ADA: New Consensus Statement on Inpatient Glycemic Control   Target Ranges:  Prepandial:   less than 140 mg/dL      Peak postprandial:   less than 180 mg/dL (1-2 hours)      Critically ill patients:  140 - 180 mg/dL    Latest Reference Range & Units 07/03/23 07:41 07/03/23 11:16 07/03/23 16:13 07/03/23 21:28 07/04/23 03:06 07/04/23 07:38  Glucose-Capillary 70 - 99 mg/dL 796 (H) 770 (H) 799 (H) 337 (H) 242 (H) 272 (H)   Review of Glycemic Control  Diabetes history: DM2 Outpatient Diabetes medications: Glipizide  XL 5 mg QAM, Humalog  75/25 60 units QAM, 50 units QPM  Current orders for Inpatient glycemic control: 70/30 10 units BID, Novolog  0-15 units TID with meals; Solumedrol 60 mg Q12H  Inpatient Diabetes Program Recommendations:    Insulin : If steroids are continued as ordered, please consider increasing 70/30 to 15 units BID.  Thanks, Earnie Gainer, RN, MSN, CDCES Diabetes Coordinator Inpatient Diabetes Program [phone] (Team Pager from 8am to 5pm)

## 2023-07-04 NOTE — Care Management Important Message (Signed)
 Important Message  Patient Details  Name: Ann [name] MRN: [id number] Date of Birth: [birth year]   Important Message Given:  Yes - Medicare IM (reviewed letter telephonically at [phone])     Neila Bally 07/04/2023, 3:31 PM

## 2023-07-04 NOTE — Progress Notes (Addendum)
 PROGRESS NOTE  Ann [name] FMW:[id number] DOB: Nov 11, 1945 DOA: 06/28/2023 PCP: [name], MD  Brief History:  78 y.o. female with medical history significant of coronary disease, COPD, chronic hypoxic respiratory failure on 2 L oxygen ,  diastolic CHF, status post prior stents, persistent A-fib, on Eliquis , hypertension, GERD, chronic anticoagulation, history of DVT in the past, diabetes mellitus type 2, NS Vtach, insulin -dependent who presents to the emergency department due to several days of onset of shortness of breath which has been worsening, this was associated with increased cough.  Patient now presents with severe shortness of breath despite being on supplemental oxygen  and could barely take 1 or 2 steps without having to stop to take a breath.  Patient denies diarrhea or vomiting.  O2 sat on arrival to the ED was 80% on 2 LPM.  Patient was placed on 4 L nasal cannula with saturation in the mid 90s.   ED Course:  In the emergency department, she was tachypneic, pulse was 84 bpm on arrival to the ED, BP 106/88, O2 sat was 98 to 94% on supplemental oxygen  via [place] at 4 LPM.  Workup in the ED showed normal CBC except for WBC of 11.0, normal BMP.  Troponin 45 > 43.  Chest x-ray was suggestive of multifocal pneumonia Patient was treated with vancomycin  and Zosyn .  IV Solu-Medrol  25 mg x 1 was given.  Hospitalist was asked to admit patient for further evaluation and management.     Assessment/Plan: Multifocal pneumonia Patient was started on vancomycin  and Zosyn  -d/c vanc as MRSA is neg -added azithromycin  Continue Tylenol  as needed Continue Mucinex , Robitussin, incentive spirometry, flutter valve  -PCT 0.36 -finished 7 days zosyn  2/7   Acute on chronic respiratory failure with hypoxia -due to pneumonia and COPD exacerbation Patient is currently on supplemental oxygen  3 LPM (she uses 2 LPM at home) Continue supplemental oxygen  to maintain O2 sats > 90% and  consider weaning patient down to home oxygen  requirement as tolerated. -CT chest--substantial atelectasis RML, hazy density RML; LLL airway plugging; 5 mm RUL nodule; subacute R-4th rib fx -chest physiotherapy   COPD exacerbation -added brovana  -increased pulmicort  -start IV solumedrol -add atrovent  nebs>>Yupelri  -viral respiratory panel +metapneumovirus   Elevated troponin Troponin 45 > 43 suspect mild elevation demand ischemia -no chest pain -due to demand ischemia   Morbid obesity (BMI 48.89) Patient was counseled about the cardiovascular and metabolic risk of morbid obesity. Patient was counseled for diet control, exercise regimen and weight loss.    Chronic diastolic CHF Continue total input/output, daily weights and fluid restriction Continue heart healthy diet      Echocardiogram 4/24: normal LVEF, indeterminate DD History of previous DVT Continue apixaban  for full anticoagulation  -lasix  on hold temporarily due to rise in creatinine and soft BPs -2/7--restart lasix    Chronic atrial fibrillation Continue Cardizem ,  apixaban  -holding bisoprolol  due to soft BPs>>restart 2/7 as pt had episode RVR after given lopressor  IV x 1   CAD Patient denies chest pain Continue Cardizem , apixaban   -holding bisoprolol  due to soft BPs   Acquired hypothyroidism Continue levothyroxine     Type 2 diabetes mellitus Hemoglobin A1c done on 06/25/2022 was 8.6 Continue ISS  -70/30 insulin  -- increase to 15 units bid                   Family Communication:   no Family at bedside   Consultants:  none   Code Status:  DNR   DVT Prophylaxis:  apixaban      Procedures: As Listed in Progress Note Above   Antibiotics: Zosyn  2/1>>2/7 Azithro 2/5>>          Subjective: Pt continues to feel weak.  Denies f/c, cp.  States sob is improving.  Denies n/v/d  Objective: Vitals:   07/04/23 0834 07/04/23 1104 07/04/23 1327 07/04/23 1336  BP: (!) 161/102 (!) 145/117 (!)  135/104   Pulse: 89 62 96   Resp:   17   Temp: 98.2 F (36.8 C)  98.3 F (36.8 C)   TempSrc:      SpO2: 97% 92% 94% 95%  Weight:      Height:        Intake/Output Summary (Last 24 hours) at 07/04/2023 1653 Last data filed at 07/04/2023 0800 Gross per 24 hour  Intake 540 ml  Output 800 ml  Net -260 ml   Weight change: 1.5 kg Exam:  General:  Pt is alert, follows commands appropriately, not in acute distress HEENT: No icterus, No thrush, No neck mass, [place]/AT Cardiovascular: RRR, S1/S2, no rubs, no gallops Respiratory: scattered rales.  Bibasilar wheeze Abdomen: Soft/+BS, non tender, non distended, no guarding Extremities: No edema, No lymphangitis, No petechiae, No rashes, no synovitis   Data Reviewed: I have personally reviewed following labs and imaging studies Basic Metabolic Panel: Recent Labs  Lab 06/28/23 1725 06/29/23 0424 07/01/23 0343 07/02/23 0439 07/04/23 0414  NA 143 140 140 138 134*  K 4.5 4.5 3.8 3.8 3.9  CL 103 101 100 95* 93*  CO2 30 27 31  34* 31  GLUCOSE 77 190* 63* 107* 253*  BUN 18 22 25* 19 25*  CREATININE 0.97 0.87 0.92 0.98 0.91  CALCIUM  9.1 8.9 8.8* 8.5* 9.2  MG  --  2.0  --  1.9 2.1  PHOS  --  4.2  --   --   --    Liver Function Tests: Recent Labs  Lab 06/29/23 0424  AST 23  ALT 17  ALKPHOS 59  BILITOT 0.9  PROT 6.9  ALBUMIN  3.2*   No results for input(s): LIPASE, AMYLASE in the last 168 hours. No results for input(s): AMMONIA in the last 168 hours. Coagulation Profile: No results for input(s): INR, PROTIME in the last 168 hours. CBC: Recent Labs  Lab 06/28/23 1725 06/29/23 0424 07/02/23 0439  WBC 11.0* 8.4 7.0  NEUTROABS 9.3*  --   --   HGB 14.0 13.5 13.2  HCT 44.8 45.0 42.5  MCV 95.7 96.4 95.7  PLT 197 176 217   Cardiac Enzymes: No results for input(s): CKTOTAL, CKMB, CKMBINDEX, TROPONINI in the last 168 hours. BNP: Invalid input(s): POCBNP CBG: Recent Labs  Lab 07/03/23 2128 07/04/23 0306  07/04/23 0738 07/04/23 1140 07/04/23 1633  GLUCAP 337* 242* 272* 273* 187*   HbA1C: No results for input(s): HGBA1C in the last 72 hours. Urine analysis:    Component Value Date/Time   COLORURINE YELLOW 03/21/2023 1345   APPEARANCEUR HAZY (A) 03/21/2023 1345   APPEARANCEUR Cloudy (A) 08/19/2022 1413   LABSPEC 1.010 03/21/2023 1345   PHURINE 7.0 03/21/2023 1345   GLUCOSEU 50 (A) 03/21/2023 1345   HGBUR NEGATIVE 03/21/2023 1345   BILIRUBINUR NEGATIVE 03/21/2023 1345   BILIRUBINUR Negative 08/19/2022 1413   KETONESUR 5 (A) 03/21/2023 1345   PROTEINUR 100 (A) 03/21/2023 1345   UROBILINOGEN 0.2 03/15/2013 1504   NITRITE NEGATIVE 03/21/2023 1345   LEUKOCYTESUR NEGATIVE 03/21/2023 1345   Sepsis Labs: @LABRCNTIP (procalcitonin:4,lacticidven:4) ) Recent  Results (from the past 240 hours)  Resp panel by RT-PCR (RSV, Flu A&B, Covid) Anterior Nasal Swab     Status: None   Collection Time: 06/28/23  5:03 PM   Specimen: Anterior Nasal Swab  Result Value Ref Range Status   SARS Coronavirus 2 by RT PCR NEGATIVE NEGATIVE Final    Comment: (NOTE) SARS-CoV-2 target nucleic acids are NOT DETECTED.  The SARS-CoV-2 RNA is generally detectable in upper respiratory specimens during the acute phase of infection. The lowest concentration of SARS-CoV-2 viral copies this assay can detect is 138 copies/mL. A negative result does not preclude SARS-Cov-2 infection and should not be used as the sole basis for treatment or other patient management decisions. A negative result may occur with  improper specimen collection/handling, submission of specimen other than nasopharyngeal swab, presence of viral mutation(s) within the areas targeted by this assay, and inadequate number of viral copies(<138 copies/mL). A negative result must be combined with clinical observations, patient history, and epidemiological information. The expected result is Negative.  Fact Sheet for Patients:   bloggercourse.com  Fact Sheet for Healthcare Providers:  seriousbroker.it  This test is no t yet approved or cleared by the [place]  FDA and  has been authorized for detection and/or diagnosis of SARS-CoV-2 by FDA under an Emergency Use Authorization (EUA). This EUA will remain  in effect (meaning this test can be used) for the duration of the COVID-19 declaration under Section 564(b)(1) of the Act, 21 U.S.C.section 360bbb-3(b)(1), unless the authorization is terminated  or revoked sooner.       Influenza A by PCR NEGATIVE NEGATIVE Final   Influenza B by PCR NEGATIVE NEGATIVE Final    Comment: (NOTE) The Xpert Xpress SARS-CoV-2/FLU/RSV plus assay is intended as an aid in the diagnosis of influenza from Nasopharyngeal swab specimens and should not be used as a sole basis for treatment. Nasal washings and aspirates are unacceptable for Xpert Xpress SARS-CoV-2/FLU/RSV testing.  Fact Sheet for Patients: bloggercourse.com  Fact Sheet for Healthcare Providers: seriousbroker.it  This test is not yet approved or cleared by the [place]  FDA and has been authorized for detection and/or diagnosis of SARS-CoV-2 by FDA under an Emergency Use Authorization (EUA). This EUA will remain in effect (meaning this test can be used) for the duration of the COVID-19 declaration under Section 564(b)(1) of the Act, 21 U.S.C. section 360bbb-3(b)(1), unless the authorization is terminated or revoked.     Resp Syncytial Virus by PCR NEGATIVE NEGATIVE Final    Comment: (NOTE) Fact Sheet for Patients: bloggercourse.com  Fact Sheet for Healthcare Providers: seriousbroker.it  This test is not yet approved or cleared by the [place]  FDA and has been authorized for detection and/or diagnosis of SARS-CoV-2 by FDA under an Emergency Use  Authorization (EUA). This EUA will remain in effect (meaning this test can be used) for the duration of the COVID-19 declaration under Section 564(b)(1) of the Act, 21 U.S.C. section 360bbb-3(b)(1), unless the authorization is terminated or revoked.  Performed at [facility], [address], [place], [place] [address]   Blood culture (routine x 2)     Status: None   Collection Time: 06/28/23  8:03 PM   Specimen: BLOOD RIGHT HAND  Result Value Ref Range Status   Specimen Description BLOOD RIGHT HAND  Final   Special Requests   Final    BOTTLES DRAWN AEROBIC ONLY Blood Culture adequate volume   Culture   Final    NO GROWTH 5 DAYS Performed at [facility], [address] [place], [place] [address]    Report Status 07/03/2023 FINAL  Final  Blood culture (routine x 2)     Status: None   Collection Time: 06/28/23  8:10 PM   Specimen: Left Antecubital; Blood  Result Value Ref Range Status   Specimen Description LEFT ANTECUBITAL  Final   Special Requests   Final    BOTTLES DRAWN AEROBIC ONLY Blood Culture adequate volume   Culture   Final    NO GROWTH 5 DAYS Performed at [facility], [address] [place], [place] [address]    Report Status 07/03/2023 FINAL  Final  MRSA Next Gen by PCR, Nasal     Status: None   Collection Time: 06/29/23  7:56 AM   Specimen: Nasal Mucosa; Nasal Swab  Result Value Ref Range Status   MRSA by PCR Next Gen NOT DETECTED NOT DETECTED Final    Comment: (NOTE) The GeneXpert MRSA Assay (FDA approved for NASAL specimens only), is one component of a comprehensive MRSA colonization surveillance program. It is not intended to diagnose MRSA infection nor to guide or monitor treatment for MRSA infections. Test performance is not FDA approved in patients less than 33 years old. Performed at [facility], [address], [place], [place] [address]   Respiratory (~20 pathogens) panel by PCR     Status: Abnormal   Collection Time: 07/02/23  3:20 PM   Specimen:  Nasopharyngeal Swab; Respiratory  Result Value Ref Range Status   Adenovirus NOT DETECTED NOT DETECTED Final   Coronavirus 229E NOT DETECTED NOT DETECTED Final    Comment: (NOTE) The Coronavirus on the Respiratory Panel, DOES NOT test for the novel  Coronavirus (2019 nCoV)    Coronavirus HKU1 NOT DETECTED NOT DETECTED Final   Coronavirus NL63 NOT DETECTED NOT DETECTED Final   Coronavirus OC43 NOT DETECTED NOT DETECTED Final   Metapneumovirus DETECTED (A) NOT DETECTED Final   Rhinovirus / Enterovirus NOT DETECTED NOT DETECTED Final   Influenza A NOT DETECTED NOT DETECTED Final   Influenza B NOT DETECTED NOT DETECTED Final   Parainfluenza Virus 1 NOT DETECTED NOT DETECTED Final   Parainfluenza Virus 2 NOT DETECTED NOT DETECTED Final   Parainfluenza Virus 3 NOT DETECTED NOT DETECTED Final   Parainfluenza Virus 4 NOT DETECTED NOT DETECTED Final   Respiratory Syncytial Virus NOT DETECTED NOT DETECTED Final   Bordetella pertussis NOT DETECTED NOT DETECTED Final   Bordetella Parapertussis NOT DETECTED NOT DETECTED Final   Chlamydophila pneumoniae NOT DETECTED NOT DETECTED Final   Mycoplasma pneumoniae NOT DETECTED NOT DETECTED Final    Comment: Performed at [facility], [address] [place],  [address]     Scheduled Meds:  apixaban   5 mg Oral BID   arformoterol   15 mcg Nebulization BID   atorvastatin   80 mg Oral Daily   azithromycin   500 mg Oral Daily   bisoprolol   5 mg Oral Daily   budesonide   0.5 mg Nebulization BID   dextromethorphan -guaiFENesin   1 tablet Oral BID   diltiazem   120 mg Oral Daily   ferrous sulfate   325 mg Oral Q breakfast   furosemide   40 mg Oral BID   gabapentin   400 mg Oral QHS   insulin  aspart  0-15 Units Subcutaneous TID WC   insulin  aspart protamine- aspart  15 Units Subcutaneous BID AC   levalbuterol   0.63 mg Nebulization Q6H   levothyroxine   175 mcg Oral q morning   methylPREDNISolone  (SOLU-MEDROL ) injection  60 mg Intravenous Q12H    mirabegron  ER  25 mg Oral Daily   nystatin    Topical BID   pantoprazole   40 mg Oral BID   revefenacin   175 mcg Nebulization Daily   saccharomyces boulardii  250 mg Oral BID   Continuous Infusions:  Procedures/Studies: CT HEAD WO CONTRAST ( ) Result Date: 07/04/2023 CLINICAL DATA:  Dizziness, headache EXAM: CT HEAD WITHOUT CONTRAST TECHNIQUE: Contiguous axial images were obtained from the base of the skull through the vertex without intravenous contrast. RADIATION DOSE REDUCTION: This exam was performed according to the departmental dose-optimization program which includes automated exposure control, adjustment of the mA and/or kV according to patient size and/or use of iterative reconstruction technique. COMPARISON:  12/10/2019 FINDINGS: Brain: No evidence of acute infarction, hemorrhage, mass, mass effect, or midline shift. No hydrocephalus or extra-axial fluid collection. Normal cerebral volume for age. Vascular: No hyperdense vessel. Skull: Negative for fracture or focal lesion. Sinuses/Orbits: Mucosal thickening in the paranasal sinuses, with multifocal air-fluid levels and bubbly fluid. Status post bilateral lens replacements. Other: Fluid in the bilateral mastoid air cells. IMPRESSION: 1. No acute intracranial process. 2. Mucosal thickening in the paranasal sinuses, with multifocal air-fluid levels and bubbly fluid, which can be seen in the setting of acute sinusitis. Electronically Signed   By: Donald Campion M.D.   On: 07/04/2023 16:39   CT CHEST WO CONTRAST Result Date: 07/02/2023 CLINICAL DATA:  Cough, shortness of breath, and chest pain for 1 week. EXAM: CT CHEST WITHOUT CONTRAST TECHNIQUE: Multidetector CT imaging of the chest was performed following the standard protocol without IV contrast. RADIATION DOSE REDUCTION: This exam was performed according to the departmental dose-optimization program which includes automated exposure control, adjustment of the mA and/or kV according to patient  size and/or use of iterative reconstruction technique. COMPARISON:  12/14/2021 FINDINGS: Cardiovascular: Coronary, aortic arch, and branch vessel atherosclerotic vascular disease. Mild cardiomegaly. Dilated main pulmonary artery at 4.8 cm, favoring pulmonary arterial hypertension. Small amount of non dependent gas in the right atrium, most commonly introduced via IV line. In the absence of a right to left shunt this is typically clinically inconsequential. Mediastinum/Nodes: Stable asymmetry of the thyroid  with the right lobe larger than the left. No well-defined mass on today's noncontrast imaging. No pathologic adenopathy. Lungs/Pleura: Part solid 5 mm right apical segment right upper lobe pulmonary nodule, image 32 series 3, not present previously. Substantial atelectasis most of the right middle lobe. Hazy density in the aerated portion of the right middle lobe may likewise be from atelectasis, or alveolitis. There is airway plugging causing occlusion of the right middle lobe bronchi. Atelectasis posteriorly in the right upper lobe. Bandlike atelectasis in the superior segment right lower lobe. Dependent mucus in the right lower lobe bronchus. Mild scarring in the left upper lobe and lingula. Airway plugging in atelectasis in much of the left lower lobe especially medially and posteriorly. Upper Abdomen: Slightly nodular severe liver contour, cannot exclude cirrhosis. Cholecystectomy. Abdominal aortic atherosclerosis. Musculoskeletal: Lower cervical plate and screw fixator. Degenerative right glenohumeral arthropathy. Subacute healing fracture of the right anterior fourth rib. Thoracic spondylosis with multilevel bridging spurring in the thoracic spine anteriorly. IMPRESSION: 1. Substantial atelectasis most of the right middle lobe. Hazy density in the aerated portion of the right middle lobe may likewise be from atelectasis, or alveolitis. There is airway plugging causing occlusion of the right middle lobe  bronchi. 2. Airway plugging in atelectasis in much of the left lower lobe especially medially and posteriorly. 3.  Dependent mucus in the right lower lobe bronchus. 4. Dilated main pulmonary artery at 4.8 cm, favoring pulmonary arterial hypertension. 5. Coronary, aortic arch, and branch vessel atherosclerotic vascular disease. 6. Mild cardiomegaly. 7. Slightly nodular severe liver contour, cannot exclude cirrhosis. 8. Subacute healing fracture of the right anterior fourth rib. 9.  Aortic Atherosclerosis (ICD10-I70.0). 10. Part solid 5 mm right apical segment right upper lobe pulmonary nodule not present previously. No follow-up recommended. This recommendation follows the consensus statement: Guidelines for Management of Incidental Pulmonary Nodules Detected on CT Images: From the Fleischner Society 2017; Radiology 2017; 284:228-243. Electronically Signed   By: Ryan Salvage M.D.   On: 07/02/2023 18:36   DG Chest 2 View Result Date: 06/28/2023 CLINICAL DATA:  Cough, shortness of breath and chest pain for 1 week. EXAM: CHEST - 2 VIEW COMPARISON:  05/22/2023 FINDINGS: Exam detail is diminished secondary to patient's body habitus and suboptimal inspiration. Cardiac enlargement and aortic atherosclerosis. Enlargement of the pulmonary arteries compatible with PA hypertension. Opacities are noted within the periphery of the left midlung and both lung bases. Chronic coarsened interstitial markings noted bilaterally compatible with COPD/emphysema. Previous ACDF within the lower cervical spine. IMPRESSION: 1. Opacities within the periphery of the left midlung and both lung bases. Cannot exclude multifocal pneumonia. 2. COPD/emphysema. 3. Enlargement of the pulmonary arteries compatible with PA hypertension. Electronically Signed   By: Waddell Calk M.D.   On: 06/28/2023 17:47   US  ARTERIAL SEG MULTIPLE LE (ABI, SEGMENTAL PRESSURES, PVR'S) Result Date: 06/16/2023 CLINICAL DATA:  Peripheral vascular disease EXAM:  NONINVASIVE PHYSIOLOGIC VASCULAR STUDY OF BILATERAL LOWER EXTREMITIES TECHNIQUE: Non-invasive vascular evaluation of both lower extremities was performed at rest, including calculation of ankle-brachial indices, multiple segmental pressure evaluation, segmental Doppler and segmental pulse volume recording. COMPARISON:  None Available. FINDINGS: Right Lower Extremity Resting ABI:  0.95 Resting TBI: 0.62 Segmental Pressures: Limited utility of segmental pressures due to poor compressibility. Great toe pressure: 92 mm Hg Arterial Waveforms: Normal tri-phasic arterial waveforms. PVRs: Normal PVRs with maintained waveform amplitude, augmentation and quality. Left Lower Extremity: Resting ABI: 0.84 Resting TBI: 1.1 Segmental Pressures: Limited utility of segmental pressures due to poor compressibility. Great toe pressure: 168 mm Hg Arterial Waveforms: Normal tri-phasic arterial waveforms. PVRs: Normal PVRs with maintained waveform amplitude, augmentation and quality. Other: Symmetric upper extremity pressures. Ankle Brachial index > 1.4 Non diagnostic secondary to incompressible vessel calcifications 1.0-1.4       Normal 0.9-0.99     Borderline PAD 0.8-0.89     Mild PAD 0.5-0.79     Moderate PAD < 0.5          Severe PAD Toe Brachial Index Normal     >0.65 Moderate  0.53-0.64 Severe     <0.23 Toe Pressures Absolute toe pressure >59mmHg sufficient for wound healing. Toe pressures <21mmHg = critical limb ischemia. IMPRESSION: 1. Borderline decreased right ankle and toe brachial index consistent with borderline underlying peripheral arterial disease. 2. Mildly decreased resting left ankle-brachial index with a normal toe brachial index also consistent with borderline underlying peripheral arterial disease. 3. Limited utility of segment pressures due to noncompressibility of the vessels at multiple locations. This is likely due to patient body habitus. Electronically Signed   By: Wilkie Lent M.D.   On: 06/16/2023 13:55     Alm Schneider, DO  [facility]  If 7PM-7AM, please contact night-coverage [email or web address] Password [email or web address] 07/04/2023, 4:53 PM   LOS: 6 days

## 2023-07-05 DIAGNOSIS — J441 Chronic obstructive pulmonary disease with (acute) exacerbation: Secondary | ICD-10-CM | POA: Diagnosis not present

## 2023-07-05 DIAGNOSIS — J189 Pneumonia, unspecified organism: Secondary | ICD-10-CM | POA: Diagnosis not present

## 2023-07-05 DIAGNOSIS — I5032 Chronic diastolic (congestive) heart failure: Secondary | ICD-10-CM | POA: Diagnosis not present

## 2023-07-05 DIAGNOSIS — J9621 Acute and chronic respiratory failure with hypoxia: Secondary | ICD-10-CM | POA: Diagnosis not present

## 2023-07-05 LAB — GLUCOSE, CAPILLARY
Glucose-Capillary: 202 mg/dL — ABNORMAL HIGH (ref 70–99)
Glucose-Capillary: 229 mg/dL — ABNORMAL HIGH (ref 70–99)
Glucose-Capillary: 292 mg/dL — ABNORMAL HIGH (ref 70–99)
Glucose-Capillary: 369 mg/dL — ABNORMAL HIGH (ref 70–99)
Glucose-Capillary: 259 mg/dL — ABNORMAL HIGH (ref 70–99)

## 2023-07-05 LAB — BASIC METABOLIC PANEL
Anion gap: 11 (ref 5–15)
BUN: 28 mg/dL — ABNORMAL HIGH (ref 8–23)
CO2: 31 mmol/L (ref 22–32)
Calcium: 9.3 mg/dL (ref 8.9–10.3)
Chloride: 94 mmol/L — ABNORMAL LOW (ref 98–111)
Creatinine, Ser: 0.88 mg/dL (ref 0.44–1.00)
GFR, Estimated: >60 mL/min (ref >60)
Glucose, Bld: 241 mg/dL — ABNORMAL HIGH (ref 70–99)
Potassium: 4 mmol/L (ref 3.5–5.1)
Sodium: 136 mmol/L (ref 135–145)

## 2023-07-05 LAB — MAGNESIUM: Magnesium: 2.1 mg/dL (ref 1.7–2.4)

## 2023-07-05 MED ORDER — BUDESONIDE 0.5 MG/2ML IN SUSP
0.5000 mg | Freq: Two times a day (BID) | RESPIRATORY_TRACT | Status: DC
  Administered 2023-07-05 – 2023-07-07 (×4): 0.5 mg via RESPIRATORY_TRACT
  Filled 2023-07-05 (×4): qty 2

## 2023-07-05 MED ORDER — HYDRALAZINE HCL 20 MG/ML IJ SOLN
10.0000 mg | Freq: Four times a day (QID) | INTRAMUSCULAR | Status: DC | PRN
  Administered 2023-07-05: 10 mg via INTRAVENOUS
  Filled 2023-07-05: qty 1

## 2023-07-05 NOTE — Plan of Care (Signed)
  Problem: Coping: Goal: Ability to adjust to condition or change in health will improve Outcome: Progressing   Problem: Skin Integrity: Goal: Risk for impaired skin integrity will decrease Outcome: Progressing   Problem: Clinical Measurements: Goal: Respiratory complications will improve Outcome: Progressing

## 2023-07-05 NOTE — Progress Notes (Signed)
 Physical Therapy Treatment Patient Details Name: Ann [name] MRN: [id number] DOB: Jun 26, 1945 Today's Date: 07/05/2023   History of Present Illness LENIYAH [name] is a 78 y.o. female with medical history significant of COPD, chronic hypoxic respiratory failure on 2 L oxygen , CAD, diastolic CHF, status post prior stents, A-fib, on Eliquis , hypertension, GERD, chronic anticoagulation, history of DVT in the past, diabetes mellitus type 2, insulin -dependent who presents to the emergency department due to several days of onset of shortness of breath which has been worsening, this was associated with increased cough.  Patient now presents with severe shortness of breath despite being on supplemental oxygen  and could barely take 1 or 2 steps without having to stop to take a breath.  Patient denies diarrhea or vomiting.  O2 sat on arrival to the ED was 80% on 2 LPM.    PT Comments  Pt much improved with mobility.  States she has multiple family members at home to assist her. yes    If plan is discharge home, recommend the following: A little help with walking and/or transfers;A little help with bathing/dressing/bathroom;Help with stairs or ramp for entrance;Assistance with cooking/housework   Can travel by private vehicle      yes  Equipment Recommendations  None recommended by PT    Recommendations for Other Services  none     Precautions / Restrictions Precautions Precautions: Fall Restrictions Weight Bearing Restrictions Per Provider Order: No     Mobility  Bed Mobility Overal bed mobility: Needs Assistance Bed Mobility: Supine to Sit     Supine to sit: Min assist, Used rails, HOB elevated          Transfers Overall transfer level: Needs assistance   Transfers: Sit to/from Stand Sit to Stand: Supervision   Step pivot transfers: Contact guard assist            Ambulation/Gait Ambulation/Gait assistance: Contact guard assist Gait Distance (Feet): 4 Feet Assistive  device: Rolling walker (2 wheels) Gait Pattern/deviations: Trunk flexed Gait velocity: decreased               Cognition Arousal: Alert Behavior During Therapy: WFL for tasks assessed/performed Overall Cognitive Status: Within Functional Limits for tasks assessed                                          Exercises General Exercises - Lower Extremity Ankle Circles/Pumps: AROM, 10 reps Quad Sets: AROM, 10 reps Heel Slides: AROM, 5 reps Hip ABduction/ADduction: AROM, 5 reps         Pertinent Vitals/Pain Pain Assessment Pain Assessment: 0-10 Pain Score: 4  Pain Location: Lt great toe, therapist took footie off pt noted a wound; contacted RN Luke about this Pain Descriptors / Indicators: Discomfort Pain Intervention(s):  (took footie off to relieve pressure)    Home Living Family/patient expects to be discharged to:: Private residence Living Arrangements: Spouse/significant other;Children;Other relatives Available Help at Discharge: Family;Available 24 hours/day Type of Home: House Home Access: Ramped entrance     Alternate Level Stairs-Number of Steps: flight Home Layout: Two level;Able to live on main level with bedroom/bathroom;Full bath on main level Home Equipment: Rollator (4 wheels);BSC/3in1;Grab bars - tub/shower;Wheelchair - manual;Hospital bed;Cane - single Librarian, Academic (2 wheels);Hand held shower head           PT Goals (current goals can now be found in the care plan section) Acute Rehab  PT Goals Patient Stated Goal: return home PT Goal Formulation: With patient Time For Goal Achievement: 07/15/23 Potential to Achieve Goals: Good    Frequency    Min 2X/week      PT Plan  Continue mobility increase gait             End of Session Equipment Utilized During Treatment: Oxygen  Activity Tolerance: Patient tolerated treatment well;Other (comment) Patient left: in chair;with call bell/phone within reach Nurse  Communication: Mobility status PT Visit Diagnosis: Unsteadiness on feet (R26.81);Other abnormalities of gait and mobility (R26.89);Muscle weakness (generalized) (M62.81)     Time: 8864-8796 PT Time Calculation (min) (ACUTE ONLY): 28 min  Charges:    $Therapeutic Exercise: 8-22 mins $Therapeutic Activity: 8-22 mins PT General Charges $$ ACUTE PT VISIT: 1 Visit                      Montie Metro, PT CLT [phone]  07/05/2023, 12:03 PM

## 2023-07-05 NOTE — Progress Notes (Signed)
 PROGRESS NOTE  Ann [name] FMW:[id number] DOB: [birth year] DOA: 06/28/2023 PCP: [name], MD  Brief History:  78 y.o. female with medical history significant of coronary disease, COPD, chronic hypoxic respiratory failure on 2 L oxygen ,  diastolic CHF, status post prior stents, persistent A-fib, on Eliquis , hypertension, GERD, chronic anticoagulation, history of DVT in the past, diabetes mellitus type 2, NS Vtach, insulin -dependent who presents to the emergency department due to several days of onset of shortness of breath which has been worsening, this was associated with increased cough.  Patient now presents with severe shortness of breath despite being on supplemental oxygen  and could barely take 1 or 2 steps without having to stop to take a breath.  Patient denies diarrhea or vomiting.  O2 sat on arrival to the ED was 80% on 2 LPM.  Patient was placed on 4 L nasal cannula with saturation in the mid 90s.   ED Course:  In the emergency department, she was tachypneic, pulse was 84 bpm on arrival to the ED, BP 106/88, O2 sat was 98 to 94% on supplemental oxygen  via [place] at 4 LPM.  Workup in the ED showed normal CBC except for WBC of 11.0, normal BMP.  Troponin 45 > 43.  Chest x-ray was suggestive of multifocal pneumonia Patient was treated with vancomycin  and Zosyn .  IV Solu-Medrol  25 mg x 1 was given.  Hospitalist was asked to admit patient for further evaluation and management.     Assessment/Plan: Multifocal pneumonia Patient was started on vancomycin  and Zosyn  -d/c vanc as MRSA is neg -added azithromycin  Continue Tylenol  as needed Continue Mucinex , Robitussin, incentive spirometry, flutter valve  -PCT 0.36 -finished 7 days zosyn  2/7   Acute on chronic respiratory failure with hypoxia -due to pneumonia and COPD exacerbation Patient is currently on supplemental oxygen  3 LPM (she uses 2 LPM at home) Continue supplemental oxygen  to maintain O2 sats > 90% and  consider weaning patient down to home oxygen  requirement as tolerated. -CT chest--substantial atelectasis RML, hazy density RML; LLL airway plugging; 5 mm RUL nodule; subacute R-4th rib fx -chest physiotherapy   COPD exacerbation -added brovana  -increased pulmicort  -start IV solumedrol -add atrovent  nebs>>Yupelri  -viral respiratory panel +metapneumovirus   Elevated troponin Troponin 45 > 43 suspect mild elevation demand ischemia -no chest pain -due to demand ischemia   Morbid obesity (BMI 48.89) Patient was counseled about the cardiovascular and metabolic risk of morbid obesity. Patient was counseled for diet control, exercise regimen and weight loss.    Chronic diastolic CHF Continue total input/output, daily weights and fluid restriction Continue heart healthy diet      Echocardiogram 4/24: normal LVEF, indeterminate DD History of previous DVT Continue apixaban  for full anticoagulation  -lasix  on hold temporarily due to rise in creatinine and soft BPs -2/7--restart lasix    Chronic atrial fibrillation Continue Cardizem ,  apixaban  -holding bisoprolol  due to soft BPs>>restart 2/7 as pt had episode RVR after given lopressor  IV x 1   CAD Patient denies chest pain Continue Cardizem , apixaban   -holding bisoprolol  due to soft BPs   Acquired hypothyroidism Continue levothyroxine     Type 2 diabetes mellitus Hemoglobin A1c done on 06/25/2022 was 8.6 Continue ISS  -70/30 insulin  -- increase to 20 units bid                   Family Communication:   no Family at bedside   Consultants:  none   Code Status:  DNR   DVT Prophylaxis:  apixaban      Procedures: As Listed in Progress Note Above   Antibiotics: Zosyn  2/1>>2/7 Azithro 2/5>>            Subjective: Overall breathing is improving, but feels weak.  Denies f/c, cp, n/v/d, abd pain  Objective: Vitals:   07/05/23 0729 07/05/23 0730 07/05/23 0731 07/05/23 0732  BP:      Pulse:      Resp:       Temp:      TempSrc:      SpO2: 94% 94% 94% 94%  Weight:      Height:        Intake/Output Summary (Last 24 hours) at 07/05/2023 1651 Last data filed at 07/05/2023 1300 Gross per 24 hour  Intake 1080 ml  Output --  Net 1080 ml   Weight change: -1 kg Exam:  General:  Pt is alert, follows commands appropriately, not in acute distress HEENT: No icterus, No thrush, No neck mass, [place]/AT Cardiovascular: RRR, S1/S2, no rubs, no gallops Respiratory:diminished BS.  Bibasilar rales.  Min basilar wheeze Abdomen: Soft/+BS, non tender, non distended, no guarding Extremities: No edema, No lymphangitis, No petechiae, No rashes, no synovitis   Data Reviewed: I have personally reviewed following labs and imaging studies Basic Metabolic Panel: Recent Labs  Lab 06/29/23 0424 07/01/23 0343 07/02/23 0439 07/04/23 0414 07/05/23 0518  NA 140 140 138 134* 136  K 4.5 3.8 3.8 3.9 4.0  CL 101 100 95* 93* 94*  CO2 27 31 34* 31 31  GLUCOSE 190* 63* 107* 253* 241*  BUN 22 25* 19 25* 28*  CREATININE 0.87 0.92 0.98 0.91 0.88  CALCIUM  8.9 8.8* 8.5* 9.2 9.3  MG 2.0  --  1.9 2.1 2.1  PHOS 4.2  --   --   --   --    Liver Function Tests: Recent Labs  Lab 06/29/23 0424  AST 23  ALT 17  ALKPHOS 59  BILITOT 0.9  PROT 6.9  ALBUMIN  3.2*   No results for input(s): LIPASE, AMYLASE in the last 168 hours. No results for input(s): AMMONIA in the last 168 hours. Coagulation Profile: No results for input(s): INR, PROTIME in the last 168 hours. CBC: Recent Labs  Lab 06/28/23 1725 06/29/23 0424 07/02/23 0439  WBC 11.0* 8.4 7.0  NEUTROABS 9.3*  --   --   HGB 14.0 13.5 13.2  HCT 44.8 45.0 42.5  MCV 95.7 96.4 95.7  PLT 197 176 217   Cardiac Enzymes: No results for input(s): CKTOTAL, CKMB, CKMBINDEX, TROPONINI in the last 168 hours. BNP: Invalid input(s): POCBNP CBG: Recent Labs  Lab 07/04/23 2047 07/05/23 0320 07/05/23 0732 07/05/23 1131 07/05/23 1628  GLUCAP 238* 202*  229* 292* 369*   HbA1C: No results for input(s): HGBA1C in the last 72 hours. Urine analysis:    Component Value Date/Time   COLORURINE YELLOW 03/21/2023 1345   APPEARANCEUR HAZY (A) 03/21/2023 1345   APPEARANCEUR Cloudy (A) 08/19/2022 1413   LABSPEC 1.010 03/21/2023 1345   PHURINE 7.0 03/21/2023 1345   GLUCOSEU 50 (A) 03/21/2023 1345   HGBUR NEGATIVE 03/21/2023 1345   BILIRUBINUR NEGATIVE 03/21/2023 1345   BILIRUBINUR Negative 08/19/2022 1413   KETONESUR 5 (A) 03/21/2023 1345   PROTEINUR 100 (A) 03/21/2023 1345   UROBILINOGEN 0.2 03/15/2013 1504   NITRITE NEGATIVE 03/21/2023 1345   LEUKOCYTESUR NEGATIVE 03/21/2023 1345   Sepsis Labs: @LABRCNTIP (procalcitonin:4,lacticidven:4) ) Recent Results (from the past 240 hours)  Resp panel by  RT-PCR (RSV, Flu A&B, Covid) Anterior Nasal Swab     Status: None   Collection Time: 06/28/23  5:03 PM   Specimen: Anterior Nasal Swab  Result Value Ref Range Status   SARS Coronavirus 2 by RT PCR NEGATIVE NEGATIVE Final    Comment: (NOTE) SARS-CoV-2 target nucleic acids are NOT DETECTED.  The SARS-CoV-2 RNA is generally detectable in upper respiratory specimens during the acute phase of infection. The lowest concentration of SARS-CoV-2 viral copies this assay can detect is 138 copies/mL. A negative result does not preclude SARS-Cov-2 infection and should not be used as the sole basis for treatment or other patient management decisions. A negative result may occur with  improper specimen collection/handling, submission of specimen other than nasopharyngeal swab, presence of viral mutation(s) within the areas targeted by this assay, and inadequate number of viral copies(<138 copies/mL). A negative result must be combined with clinical observations, patient history, and epidemiological information. The expected result is Negative.  Fact Sheet for Patients:  bloggercourse.com  Fact Sheet for Healthcare Providers:   seriousbroker.it  This test is no t yet approved or cleared by the [place]  FDA and  has been authorized for detection and/or diagnosis of SARS-CoV-2 by FDA under an Emergency Use Authorization (EUA). This EUA will remain  in effect (meaning this test can be used) for the duration of the COVID-19 declaration under Section 564(b)(1) of the Act, 21 U.S.C.section 360bbb-3(b)(1), unless the authorization is terminated  or revoked sooner.       Influenza A by PCR NEGATIVE NEGATIVE Final   Influenza B by PCR NEGATIVE NEGATIVE Final    Comment: (NOTE) The Xpert Xpress SARS-CoV-2/FLU/RSV plus assay is intended as an aid in the diagnosis of influenza from Nasopharyngeal swab specimens and should not be used as a sole basis for treatment. Nasal washings and aspirates are unacceptable for Xpert Xpress SARS-CoV-2/FLU/RSV testing.  Fact Sheet for Patients: bloggercourse.com  Fact Sheet for Healthcare Providers: seriousbroker.it  This test is not yet approved or cleared by the [place]  FDA and has been authorized for detection and/or diagnosis of SARS-CoV-2 by FDA under an Emergency Use Authorization (EUA). This EUA will remain in effect (meaning this test can be used) for the duration of the COVID-19 declaration under Section 564(b)(1) of the Act, 21 U.S.C. section 360bbb-3(b)(1), unless the authorization is terminated or revoked.     Resp Syncytial Virus by PCR NEGATIVE NEGATIVE Final    Comment: (NOTE) Fact Sheet for Patients: bloggercourse.com  Fact Sheet for Healthcare Providers: seriousbroker.it  This test is not yet approved or cleared by the [place]  FDA and has been authorized for detection and/or diagnosis of SARS-CoV-2 by FDA under an Emergency Use Authorization (EUA). This EUA will remain in effect (meaning this test can be used) for  the duration of the COVID-19 declaration under Section 564(b)(1) of the Act, 21 U.S.C. section 360bbb-3(b)(1), unless the authorization is terminated or revoked.  Performed at [facility], [address] [place], [place] [address]   Blood culture (routine x 2)     Status: None   Collection Time: 06/28/23  8:03 PM   Specimen: BLOOD RIGHT HAND  Result Value Ref Range Status   Specimen Description BLOOD RIGHT HAND  Final   Special Requests   Final    BOTTLES DRAWN AEROBIC ONLY Blood Culture adequate volume   Culture   Final    NO GROWTH 5 DAYS Performed at [facility], [address], [place], [place] [address]  Report Status 07/03/2023 FINAL  Final  Blood culture (routine x 2)     Status: None   Collection Time: 06/28/23  8:10 PM   Specimen: Left Antecubital; Blood  Result Value Ref Range Status   Specimen Description LEFT ANTECUBITAL  Final   Special Requests   Final    BOTTLES DRAWN AEROBIC ONLY Blood Culture adequate volume   Culture   Final    NO GROWTH 5 DAYS Performed at [facility], [address] [place], [place] [address]    Report Status 07/03/2023 FINAL  Final  MRSA Next Gen by PCR, Nasal     Status: None   Collection Time: 06/29/23  7:56 AM   Specimen: Nasal Mucosa; Nasal Swab  Result Value Ref Range Status   MRSA by PCR Next Gen NOT DETECTED NOT DETECTED Final    Comment: (NOTE) The GeneXpert MRSA Assay (FDA approved for NASAL specimens only), is one component of a comprehensive MRSA colonization surveillance program. It is not intended to diagnose MRSA infection nor to guide or monitor treatment for MRSA infections. Test performance is not FDA approved in patients less than 34 years old. Performed at [facility], [address], [place], [place] [address]   Respiratory (~20 pathogens) panel by PCR     Status: Abnormal   Collection Time: 07/02/23  3:20 PM   Specimen: Nasopharyngeal Swab; Respiratory  Result Value Ref Range Status   Adenovirus NOT  DETECTED NOT DETECTED Final   Coronavirus 229E NOT DETECTED NOT DETECTED Final    Comment: (NOTE) The Coronavirus on the Respiratory Panel, DOES NOT test for the novel  Coronavirus (2019 nCoV)    Coronavirus HKU1 NOT DETECTED NOT DETECTED Final   Coronavirus NL63 NOT DETECTED NOT DETECTED Final   Coronavirus OC43 NOT DETECTED NOT DETECTED Final   Metapneumovirus DETECTED (A) NOT DETECTED Final   Rhinovirus / Enterovirus NOT DETECTED NOT DETECTED Final   Influenza A NOT DETECTED NOT DETECTED Final   Influenza B NOT DETECTED NOT DETECTED Final   Parainfluenza Virus 1 NOT DETECTED NOT DETECTED Final   Parainfluenza Virus 2 NOT DETECTED NOT DETECTED Final   Parainfluenza Virus 3 NOT DETECTED NOT DETECTED Final   Parainfluenza Virus 4 NOT DETECTED NOT DETECTED Final   Respiratory Syncytial Virus NOT DETECTED NOT DETECTED Final   Bordetella pertussis NOT DETECTED NOT DETECTED Final   Bordetella Parapertussis NOT DETECTED NOT DETECTED Final   Chlamydophila pneumoniae NOT DETECTED NOT DETECTED Final   Mycoplasma pneumoniae NOT DETECTED NOT DETECTED Final    Comment: Performed at [facility], [address] [place], [place] [address]     Scheduled Meds:  apixaban   5 mg Oral BID   arformoterol   15 mcg Nebulization BID   atorvastatin   80 mg Oral Daily   azithromycin   500 mg Oral Daily   bisoprolol   5 mg Oral Daily   budesonide   0.5 mg Nebulization BID   dextromethorphan -guaiFENesin   1 tablet Oral BID   diltiazem   120 mg Oral Daily   ferrous sulfate   325 mg Oral Q breakfast   furosemide   40 mg Oral BID   gabapentin   400 mg Oral QHS   insulin  aspart  0-15 Units Subcutaneous TID WC   insulin  aspart protamine- aspart  15 Units Subcutaneous BID AC   levalbuterol   0.63 mg Nebulization Q6H   levothyroxine   175 mcg Oral q morning   methylPREDNISolone  (SOLU-MEDROL ) injection  60 mg Intravenous Q12H   mirabegron  ER  25 mg  Oral Daily   nystatin    Topical BID   pantoprazole   40 mg Oral  BID   revefenacin   175 mcg Nebulization Daily   saccharomyces boulardii  250 mg Oral BID   Continuous Infusions:  Procedures/Studies: CT HEAD WO CONTRAST ( ) Result Date: 07/04/2023 CLINICAL DATA:  Dizziness, headache EXAM: CT HEAD WITHOUT CONTRAST TECHNIQUE: Contiguous axial images were obtained from the base of the skull through the vertex without intravenous contrast. RADIATION DOSE REDUCTION: This exam was performed according to the departmental dose-optimization program which includes automated exposure control, adjustment of the mA and/or kV according to patient size and/or use of iterative reconstruction technique. COMPARISON:  12/10/2019 FINDINGS: Brain: No evidence of acute infarction, hemorrhage, mass, mass effect, or midline shift. No hydrocephalus or extra-axial fluid collection. Normal cerebral volume for age. Vascular: No hyperdense vessel. Skull: Negative for fracture or focal lesion. Sinuses/Orbits: Mucosal thickening in the paranasal sinuses, with multifocal air-fluid levels and bubbly fluid. Status post bilateral lens replacements. Other: Fluid in the bilateral mastoid air cells. IMPRESSION: 1. No acute intracranial process. 2. Mucosal thickening in the paranasal sinuses, with multifocal air-fluid levels and bubbly fluid, which can be seen in the setting of acute sinusitis. Electronically Signed   By: Donald Campion M.D.   On: 07/04/2023 16:39   CT CHEST WO CONTRAST Result Date: 07/02/2023 CLINICAL DATA:  Cough, shortness of breath, and chest pain for 1 week. EXAM: CT CHEST WITHOUT CONTRAST TECHNIQUE: Multidetector CT imaging of the chest was performed following the standard protocol without IV contrast. RADIATION DOSE REDUCTION: This exam was performed according to the departmental dose-optimization program which includes automated exposure control, adjustment of the mA and/or kV according to patient size and/or use of iterative reconstruction technique. COMPARISON:  12/14/2021 FINDINGS:  Cardiovascular: Coronary, aortic arch, and branch vessel atherosclerotic vascular disease. Mild cardiomegaly. Dilated main pulmonary artery at 4.8 cm, favoring pulmonary arterial hypertension. Small amount of non dependent gas in the right atrium, most commonly introduced via IV line. In the absence of a right to left shunt this is typically clinically inconsequential. Mediastinum/Nodes: Stable asymmetry of the thyroid  with the right lobe larger than the left. No well-defined mass on today's noncontrast imaging. No pathologic adenopathy. Lungs/Pleura: Part solid 5 mm right apical segment right upper lobe pulmonary nodule, image 32 series 3, not present previously. Substantial atelectasis most of the right middle lobe. Hazy density in the aerated portion of the right middle lobe may likewise be from atelectasis, or alveolitis. There is airway plugging causing occlusion of the right middle lobe bronchi. Atelectasis posteriorly in the right upper lobe. Bandlike atelectasis in the superior segment right lower lobe. Dependent mucus in the right lower lobe bronchus. Mild scarring in the left upper lobe and lingula. Airway plugging in atelectasis in much of the left lower lobe especially medially and posteriorly. Upper Abdomen: Slightly nodular severe liver contour, cannot exclude cirrhosis. Cholecystectomy. Abdominal aortic atherosclerosis. Musculoskeletal: Lower cervical plate and screw fixator. Degenerative right glenohumeral arthropathy. Subacute healing fracture of the right anterior fourth rib. Thoracic spondylosis with multilevel bridging spurring in the thoracic spine anteriorly. IMPRESSION: 1. Substantial atelectasis most of the right middle lobe. Hazy density in the aerated portion of the right middle lobe may likewise be from atelectasis, or alveolitis. There is airway plugging causing occlusion of the right middle lobe bronchi. 2. Airway plugging in atelectasis in much of the left lower lobe especially medially  and posteriorly. 3. Dependent mucus in the right lower lobe bronchus. 4. Dilated main  pulmonary artery at 4.8 cm, favoring pulmonary arterial hypertension. 5. Coronary, aortic arch, and branch vessel atherosclerotic vascular disease. 6. Mild cardiomegaly. 7. Slightly nodular severe liver contour, cannot exclude cirrhosis. 8. Subacute healing fracture of the right anterior fourth rib. 9.  Aortic Atherosclerosis (ICD10-I70.0). 10. Part solid 5 mm right apical segment right upper lobe pulmonary nodule not present previously. No follow-up recommended. This recommendation follows the consensus statement: Guidelines for Management of Incidental Pulmonary Nodules Detected on CT Images: From the Fleischner Society 2017; Radiology 2017; 284:228-243. Electronically Signed   By: Ryan Salvage M.D.   On: 07/02/2023 18:36   DG Chest 2 View Result Date: 06/28/2023 CLINICAL DATA:  Cough, shortness of breath and chest pain for 1 week. EXAM: CHEST - 2 VIEW COMPARISON:  05/22/2023 FINDINGS: Exam detail is diminished secondary to patient's body habitus and suboptimal inspiration. Cardiac enlargement and aortic atherosclerosis. Enlargement of the pulmonary arteries compatible with PA hypertension. Opacities are noted within the periphery of the left midlung and both lung bases. Chronic coarsened interstitial markings noted bilaterally compatible with COPD/emphysema. Previous ACDF within the lower cervical spine. IMPRESSION: 1. Opacities within the periphery of the left midlung and both lung bases. Cannot exclude multifocal pneumonia. 2. COPD/emphysema. 3. Enlargement of the pulmonary arteries compatible with PA hypertension. Electronically Signed   By: Waddell Calk M.D.   On: 06/28/2023 17:47   US  ARTERIAL SEG MULTIPLE LE (ABI, SEGMENTAL PRESSURES, PVR'S) Result Date: 06/16/2023 CLINICAL DATA:  Peripheral vascular disease EXAM: NONINVASIVE PHYSIOLOGIC VASCULAR STUDY OF BILATERAL LOWER EXTREMITIES TECHNIQUE: Non-invasive  vascular evaluation of both lower extremities was performed at rest, including calculation of ankle-brachial indices, multiple segmental pressure evaluation, segmental Doppler and segmental pulse volume recording. COMPARISON:  None Available. FINDINGS: Right Lower Extremity Resting ABI:  0.95 Resting TBI: 0.62 Segmental Pressures: Limited utility of segmental pressures due to poor compressibility. Great toe pressure: 92 mm Hg Arterial Waveforms: Normal tri-phasic arterial waveforms. PVRs: Normal PVRs with maintained waveform amplitude, augmentation and quality. Left Lower Extremity: Resting ABI: 0.84 Resting TBI: 1.1 Segmental Pressures: Limited utility of segmental pressures due to poor compressibility. Great toe pressure: 168 mm Hg Arterial Waveforms: Normal tri-phasic arterial waveforms. PVRs: Normal PVRs with maintained waveform amplitude, augmentation and quality. Other: Symmetric upper extremity pressures. Ankle Brachial index > 1.4 Non diagnostic secondary to incompressible vessel calcifications 1.0-1.4       Normal 0.9-0.99     Borderline PAD 0.8-0.89     Mild PAD 0.5-0.79     Moderate PAD < 0.5          Severe PAD Toe Brachial Index Normal     >0.65 Moderate  0.53-0.64 Severe     <0.23 Toe Pressures Absolute toe pressure >65mmHg sufficient for wound healing. Toe pressures <36mmHg = critical limb ischemia. IMPRESSION: 1. Borderline decreased right ankle and toe brachial index consistent with borderline underlying peripheral arterial disease. 2. Mildly decreased resting left ankle-brachial index with a normal toe brachial index also consistent with borderline underlying peripheral arterial disease. 3. Limited utility of segment pressures due to noncompressibility of the vessels at multiple locations. This is likely due to patient body habitus. Electronically Signed   By: Wilkie Lent M.D.   On: 06/16/2023 13:55    Alm Schneider, DO  [facility]  If 7PM-7AM, please contact  night-coverage [email or web address] Password [facility] 07/05/2023, 4:51 PM   LOS: 7 days

## 2023-07-05 NOTE — Plan of Care (Signed)
  Problem: Coping: Goal: Ability to adjust to condition or change in health will improve Outcome: Progressing   Problem: Health Behavior/Discharge Planning: Goal: Ability to manage health-related needs will improve Outcome: Progressing   Problem: Metabolic: Goal: Ability to maintain appropriate glucose levels will improve Outcome: Progressing   Problem: Skin Integrity: Goal: Risk for impaired skin integrity will decrease Outcome: Progressing   Problem: Tissue Perfusion: Goal: Adequacy of tissue perfusion will improve Outcome: Progressing   Problem: Health Behavior/Discharge Planning: Goal: Ability to manage health-related needs will improve Outcome: Progressing   Problem: Clinical Measurements: Goal: Respiratory complications will improve Outcome: Progressing

## 2023-07-06 DIAGNOSIS — J9621 Acute and chronic respiratory failure with hypoxia: Secondary | ICD-10-CM | POA: Diagnosis not present

## 2023-07-06 DIAGNOSIS — J189 Pneumonia, unspecified organism: Secondary | ICD-10-CM | POA: Diagnosis not present

## 2023-07-06 DIAGNOSIS — J441 Chronic obstructive pulmonary disease with (acute) exacerbation: Secondary | ICD-10-CM | POA: Diagnosis not present

## 2023-07-06 LAB — BASIC METABOLIC PANEL
Anion gap: 12 (ref 5–15)
BUN: 31 mg/dL — ABNORMAL HIGH (ref 8–23)
CO2: 31 mmol/L (ref 22–32)
Calcium: 9.2 mg/dL (ref 8.9–10.3)
Chloride: 94 mmol/L — ABNORMAL LOW (ref 98–111)
Creatinine, Ser: 0.83 mg/dL (ref 0.44–1.00)
GFR, Estimated: >60 mL/min (ref >60)
Glucose, Bld: 282 mg/dL — ABNORMAL HIGH (ref 70–99)
Potassium: 3.9 mmol/L (ref 3.5–5.1)
Sodium: 137 mmol/L (ref 135–145)

## 2023-07-06 LAB — GLUCOSE, CAPILLARY
Glucose-Capillary: 248 mg/dL — ABNORMAL HIGH (ref 70–99)
Glucose-Capillary: 281 mg/dL — ABNORMAL HIGH (ref 70–99)
Glucose-Capillary: 439 mg/dL — ABNORMAL HIGH (ref 70–99)
Glucose-Capillary: 302 mg/dL — ABNORMAL HIGH (ref 70–99)
Glucose-Capillary: 328 mg/dL — ABNORMAL HIGH (ref 70–99)

## 2023-07-06 LAB — MAGNESIUM: Magnesium: 2.2 mg/dL (ref 1.7–2.4)

## 2023-07-06 MED ORDER — METHYLPREDNISOLONE SODIUM SUCC 125 MG IJ SOLR
60.0000 mg | Freq: Every day | INTRAMUSCULAR | Status: DC
  Administered 2023-07-07: 60 mg via INTRAVENOUS
  Filled 2023-07-06: qty 2

## 2023-07-06 MED ORDER — INSULIN ASPART PROT & ASPART (70-30 MIX) 100 UNIT/ML ~~LOC~~ SUSP
20.0000 [IU] | Freq: Two times a day (BID) | SUBCUTANEOUS | Status: DC
  Administered 2023-07-06 – 2023-07-07 (×2): 20 [IU] via SUBCUTANEOUS

## 2023-07-06 MED ORDER — PREDNISONE 20 MG PO TABS: 60.0000 mg | ORAL_TABLET | Freq: Every day | ORAL | Status: DC

## 2023-07-06 MED ORDER — INSULIN ASPART 100 UNIT/ML IJ SOLN
20.0000 [IU] | Freq: Once | INTRAMUSCULAR | Status: AC
  Administered 2023-07-06: 20 [IU] via SUBCUTANEOUS

## 2023-07-06 MED ORDER — ARFORMOTEROL TARTRATE 15 MCG/2ML IN NEBU
15.0000 ug | INHALATION_SOLUTION | Freq: Two times a day (BID) | RESPIRATORY_TRACT | Status: DC
  Administered 2023-07-06 – 2023-07-07 (×2): 15 ug via RESPIRATORY_TRACT
  Filled 2023-07-06 (×2): qty 2

## 2023-07-06 NOTE — Plan of Care (Signed)
  Problem: Coping: Goal: Ability to adjust to condition or change in health will improve Outcome: Progressing   Problem: Health Behavior/Discharge Planning: Goal: Ability to manage health-related needs will improve Outcome: Progressing   Problem: Metabolic: Goal: Ability to maintain appropriate glucose levels will improve Outcome: Progressing   Problem: Skin Integrity: Goal: Risk for impaired skin integrity will decrease Outcome: Progressing   Problem: Tissue Perfusion: Goal: Adequacy of tissue perfusion will improve Outcome: Progressing   Problem: Health Behavior/Discharge Planning: Goal: Ability to manage health-related needs will improve Outcome: Progressing   Problem: Clinical Measurements: Goal: Respiratory complications will improve Outcome: Progressing

## 2023-07-06 NOTE — Progress Notes (Signed)
 PROGRESS NOTE  DILLON MCREYNOLDS FMW:[id number] DOB: Aug 01, 1945 DOA: 06/28/2023 PCP: [name], MD  Brief History:  78 y.o. female with medical history significant of coronary disease, COPD, chronic hypoxic respiratory failure on 2 L oxygen ,  diastolic CHF, status post prior stents, persistent A-fib, on Eliquis , hypertension, GERD, chronic anticoagulation, history of DVT in the past, diabetes mellitus type 2, NS Vtach, insulin -dependent who presents to the emergency department due to several days of onset of shortness of breath which has been worsening, this was associated with increased cough.  Patient now presents with severe shortness of breath despite being on supplemental oxygen  and could barely take 1 or 2 steps without having to stop to take a breath.  Patient denies diarrhea or vomiting.  O2 sat on arrival to the ED was 80% on 2 LPM.  Patient was placed on 4 L nasal cannula with saturation in the mid 90s.   ED Course:  In the emergency department, she was tachypneic, pulse was 84 bpm on arrival to the ED, BP 106/88, O2 sat was 98 to 94% on supplemental oxygen  via [place] at 4 LPM.  Workup in the ED showed normal CBC except for WBC of 11.0, normal BMP.  Troponin 45 > 43.  Chest x-ray was suggestive of multifocal pneumonia Patient was treated with vancomycin  and Zosyn .  IV Solu-Medrol  25 mg x 1 was given.  Hospitalist was asked to admit patient for further evaluation and management.     Assessment/Plan: Multifocal pneumonia Patient was started on vancomycin  and Zosyn  -d/c vanc as MRSA is neg -added azithromycin --finished 5 days Continue Tylenol  as needed Continue Mucinex , Robitussin, incentive spirometry, flutter valve  -PCT 0.36 -finished 7 days zosyn  2/7   Acute on chronic respiratory failure with hypoxia -due to pneumonia and COPD exacerbation Patient is currently on supplemental oxygen  3 LPM (she uses 2 LPM at home) Continue supplemental oxygen  to maintain O2 sats  > 90% and consider weaning patient down to home oxygen  requirement as tolerated. -CT chest--substantial atelectasis RML, hazy density RML; LLL airway plugging; 5 mm RUL nodule; subacute R-4th rib fx -chest physiotherapy   COPD exacerbation -added brovana  -increased pulmicort  -started IV solumedrol -add atrovent  nebs>>Yupelri  -viral respiratory panel +metapneumovirus   Elevated troponin Troponin 45 > 43 suspect mild elevation demand ischemia -no chest pain -due to demand ischemia   Morbid obesity (BMI 48.89) Patient was counseled about the cardiovascular and metabolic risk of morbid obesity. Patient was counseled for diet control, exercise regimen and weight loss.    Chronic diastolic CHF Continue total input/output, daily weights and fluid restriction Continue heart healthy diet      Echocardiogram 4/24: normal LVEF, indeterminate DD History of previous DVT Continue apixaban  for full anticoagulation  -lasix  on hold temporarily due to rise in creatinine and soft BPs -2/7--restart lasix    Chronic atrial fibrillation Continue Cardizem ,  apixaban  -holding bisoprolol  due to soft BPs>>restart 2/7 as pt had episode RVR after given lopressor  IV x 1   CAD Patient denies chest pain Continue Cardizem , apixaban   -holding bisoprolol  due to soft BPs   Acquired hypothyroidism Continue levothyroxine     Type 2 diabetes mellitus Hemoglobin A1c done on 06/25/2022 was 8.6 Continue ISS  -70/30 insulin  -- increase to 20 units bid                   Family Communication:   no Family at bedside   Consultants:  none  Code Status:  DNR   DVT Prophylaxis:  apixaban      Procedures: As Listed in Progress Note Above   Antibiotics: Zosyn  2/1>>2/7 Azithro 2/5>>2/9          Subjective: Pt is breathing better overall.  Still feels weak with cough.  Denies f/c, n/v/d abd pain  Objective: Vitals:   07/06/23 0311 07/06/23 0500 07/06/23 0919 07/06/23 1301  BP: (!) 157/99    132/84  Pulse: 74   64  Resp: 18     Temp: 98.3 F (36.8 C)   97.9 F (36.6 C)  TempSrc: Oral   Oral  SpO2: 94%  92% 95%  Weight:  112.9 kg    Height:        Intake/Output Summary (Last 24 hours) at 07/06/2023 1556 Last data filed at 07/06/2023 1335 Gross per 24 hour  Intake 960 ml  Output 1600 ml  Net -640 ml   Weight change: 0.5 kg Exam:  General:  Pt is alert, follows commands appropriately, not in acute distress HEENT: No icterus, No thrush, No neck mass, [place]/AT Cardiovascular: RRR, S1/S2, no rubs, no gallops Respiratory: bibasilar rales.  Mild basilar wheeze Abdomen: Soft/+BS, non tender, non distended, no guarding Extremities: No edema, No lymphangitis, No petechiae, No rashes, no synovitis   Data Reviewed: I have personally reviewed following labs and imaging studies Basic Metabolic Panel: Recent Labs  Lab 07/01/23 0343 07/02/23 0439 07/04/23 0414 07/05/23 0518 07/06/23 0602  NA 140 138 134* 136 137  K 3.8 3.8 3.9 4.0 3.9  CL 100 95* 93* 94* 94*  CO2 31 34* 31 31 31   GLUCOSE 63* 107* 253* 241* 282*  BUN 25* 19 25* 28* 31*  CREATININE 0.92 0.98 0.91 0.88 0.83  CALCIUM  8.8* 8.5* 9.2 9.3 9.2  MG  --  1.9 2.1 2.1 2.2   Liver Function Tests: No results for input(s): AST, ALT, ALKPHOS, BILITOT, PROT, ALBUMIN  in the last 168 hours. No results for input(s): LIPASE, AMYLASE in the last 168 hours. No results for input(s): AMMONIA in the last 168 hours. Coagulation Profile: No results for input(s): INR, PROTIME in the last 168 hours. CBC: Recent Labs  Lab 07/02/23 0439  WBC 7.0  HGB 13.2  HCT 42.5  MCV 95.7  PLT 217   Cardiac Enzymes: No results for input(s): CKTOTAL, CKMB, CKMBINDEX, TROPONINI in the last 168 hours. BNP: Invalid input(s): POCBNP CBG: Recent Labs  Lab 07/05/23 1628 07/05/23 2122 07/06/23 0303 07/06/23 0735 07/06/23 1110  GLUCAP 369* 259* 248* 281* 439*   HbA1C: No results for input(s): HGBA1C in  the last 72 hours. Urine analysis:    Component Value Date/Time   COLORURINE YELLOW 03/21/2023 1345   APPEARANCEUR HAZY (A) 03/21/2023 1345   APPEARANCEUR Cloudy (A) 08/19/2022 1413   LABSPEC 1.010 03/21/2023 1345   PHURINE 7.0 03/21/2023 1345   GLUCOSEU 50 (A) 03/21/2023 1345   HGBUR NEGATIVE 03/21/2023 1345   BILIRUBINUR NEGATIVE 03/21/2023 1345   BILIRUBINUR Negative 08/19/2022 1413   KETONESUR 5 (A) 03/21/2023 1345   PROTEINUR 100 (A) 03/21/2023 1345   UROBILINOGEN 0.2 03/15/2013 1504   NITRITE NEGATIVE 03/21/2023 1345   LEUKOCYTESUR NEGATIVE 03/21/2023 1345   Sepsis Labs: @LABRCNTIP (procalcitonin:4,lacticidven:4) ) Recent Results (from the past 240 hours)  Resp panel by RT-PCR (RSV, Flu A&B, Covid) Anterior Nasal Swab     Status: None   Collection Time: 06/28/23  5:03 PM   Specimen: Anterior Nasal Swab  Result Value Ref Range Status   SARS Coronavirus  2 by RT PCR NEGATIVE NEGATIVE Final    Comment: (NOTE) SARS-CoV-2 target nucleic acids are NOT DETECTED.  The SARS-CoV-2 RNA is generally detectable in upper respiratory specimens during the acute phase of infection. The lowest concentration of SARS-CoV-2 viral copies this assay can detect is 138 copies/mL. A negative result does not preclude SARS-Cov-2 infection and should not be used as the sole basis for treatment or other patient management decisions. A negative result may occur with  improper specimen collection/handling, submission of specimen other than nasopharyngeal swab, presence of viral mutation(s) within the areas targeted by this assay, and inadequate number of viral copies(<138 copies/mL). A negative result must be combined with clinical observations, patient history, and epidemiological information. The expected result is Negative.  Fact Sheet for Patients:  bloggercourse.com  Fact Sheet for Healthcare Providers:  seriousbroker.it  This test is no t  yet approved or cleared by the [place]  FDA and  has been authorized for detection and/or diagnosis of SARS-CoV-2 by FDA under an Emergency Use Authorization (EUA). This EUA will remain  in effect (meaning this test can be used) for the duration of the COVID-19 declaration under Section 564(b)(1) of the Act, 21 U.S.C.section 360bbb-3(b)(1), unless the authorization is terminated  or revoked sooner.       Influenza A by PCR NEGATIVE NEGATIVE Final   Influenza B by PCR NEGATIVE NEGATIVE Final    Comment: (NOTE) The Xpert Xpress SARS-CoV-2/FLU/RSV plus assay is intended as an aid in the diagnosis of influenza from Nasopharyngeal swab specimens and should not be used as a sole basis for treatment. Nasal washings and aspirates are unacceptable for Xpert Xpress SARS-CoV-2/FLU/RSV testing.  Fact Sheet for Patients: bloggercourse.com  Fact Sheet for Healthcare Providers: seriousbroker.it  This test is not yet approved or cleared by the [place]  FDA and has been authorized for detection and/or diagnosis of SARS-CoV-2 by FDA under an Emergency Use Authorization (EUA). This EUA will remain in effect (meaning this test can be used) for the duration of the COVID-19 declaration under Section 564(b)(1) of the Act, 21 U.S.C. section 360bbb-3(b)(1), unless the authorization is terminated or revoked.     Resp Syncytial Virus by PCR NEGATIVE NEGATIVE Final    Comment: (NOTE) Fact Sheet for Patients: bloggercourse.com  Fact Sheet for Healthcare Providers: seriousbroker.it  This test is not yet approved or cleared by the [place]  FDA and has been authorized for detection and/or diagnosis of SARS-CoV-2 by FDA under an Emergency Use Authorization (EUA). This EUA will remain in effect (meaning this test can be used) for the duration of the COVID-19 declaration under Section 564(b)(1)  of the Act, 21 U.S.C. section 360bbb-3(b)(1), unless the authorization is terminated or revoked.  Performed at [facility], [address] [place], [place] [address]   Blood culture (routine x 2)     Status: None   Collection Time: 06/28/23  8:03 PM   Specimen: BLOOD RIGHT HAND  Result Value Ref Range Status   Specimen Description BLOOD RIGHT HAND  Final   Special Requests   Final    BOTTLES DRAWN AEROBIC ONLY Blood Culture adequate volume   Culture   Final    NO GROWTH 5 DAYS Performed at [facility], [address] [place], [place] [address]    Report Status 07/03/2023 FINAL  Final  Blood culture (routine x 2)     Status: None   Collection Time: 06/28/23  8:10 PM   Specimen: Left Antecubital; Blood  Result Value Ref Range  Status   Specimen Description LEFT ANTECUBITAL  Final   Special Requests   Final    BOTTLES DRAWN AEROBIC ONLY Blood Culture adequate volume   Culture   Final    NO GROWTH 5 DAYS Performed at [facility], [address] [place], [place] [address]    Report Status 07/03/2023 FINAL  Final  MRSA Next Gen by PCR, Nasal     Status: None   Collection Time: 06/29/23  7:56 AM   Specimen: Nasal Mucosa; Nasal Swab  Result Value Ref Range Status   MRSA by PCR Next Gen NOT DETECTED NOT DETECTED Final    Comment: (NOTE) The GeneXpert MRSA Assay (FDA approved for NASAL specimens only), is one component of a comprehensive MRSA colonization surveillance program. It is not intended to diagnose MRSA infection nor to guide or monitor treatment for MRSA infections. Test performance is not FDA approved in patients less than 9 years old. Performed at [facility], [address], [place], [place] [address]   Respiratory (~20 pathogens) panel by PCR     Status: Abnormal   Collection Time: 07/02/23  3:20 PM   Specimen: Nasopharyngeal Swab; Respiratory  Result Value Ref Range Status   Adenovirus NOT DETECTED NOT DETECTED Final   Coronavirus 229E NOT DETECTED NOT DETECTED  Final    Comment: (NOTE) The Coronavirus on the Respiratory Panel, DOES NOT test for the novel  Coronavirus (2019 nCoV)    Coronavirus HKU1 NOT DETECTED NOT DETECTED Final   Coronavirus NL63 NOT DETECTED NOT DETECTED Final   Coronavirus OC43 NOT DETECTED NOT DETECTED Final   Metapneumovirus DETECTED (A) NOT DETECTED Final   Rhinovirus / Enterovirus NOT DETECTED NOT DETECTED Final   Influenza A NOT DETECTED NOT DETECTED Final   Influenza B NOT DETECTED NOT DETECTED Final   Parainfluenza Virus 1 NOT DETECTED NOT DETECTED Final   Parainfluenza Virus 2 NOT DETECTED NOT DETECTED Final   Parainfluenza Virus 3 NOT DETECTED NOT DETECTED Final   Parainfluenza Virus 4 NOT DETECTED NOT DETECTED Final   Respiratory Syncytial Virus NOT DETECTED NOT DETECTED Final   Bordetella pertussis NOT DETECTED NOT DETECTED Final   Bordetella Parapertussis NOT DETECTED NOT DETECTED Final   Chlamydophila pneumoniae NOT DETECTED NOT DETECTED Final   Mycoplasma pneumoniae NOT DETECTED NOT DETECTED Final    Comment: Performed at [facility], [address] [place], [place] [address]     Scheduled Meds:  apixaban   5 mg Oral BID   arformoterol   15 mcg Nebulization BID   atorvastatin   80 mg Oral Daily   azithromycin   500 mg Oral Daily   bisoprolol   5 mg Oral Daily   budesonide  (PULMICORT ) nebulizer solution  0.5 mg Nebulization BID   dextromethorphan -guaiFENesin   1 tablet Oral BID   diltiazem   120 mg Oral Daily   ferrous sulfate   325 mg Oral Q breakfast   furosemide   40 mg Oral BID   gabapentin   400 mg Oral QHS   insulin  aspart  0-15 Units Subcutaneous TID WC   insulin  aspart protamine- aspart  20 Units Subcutaneous BID AC   levalbuterol   0.63 mg Nebulization Q6H   levothyroxine   175 mcg Oral q morning   [START ON 07/07/2023] methylPREDNISolone  (SOLU-MEDROL ) injection  60 mg Intravenous Daily   mirabegron  ER  25 mg Oral Daily   nystatin    Topical BID   pantoprazole   40 mg Oral BID   revefenacin    175 mcg Nebulization Daily   saccharomyces boulardii  250 mg Oral BID   Continuous Infusions:  Procedures/Studies: CT HEAD WO CONTRAST ( ) Result Date: 07/04/2023 CLINICAL DATA:  Dizziness, headache EXAM: CT HEAD WITHOUT CONTRAST TECHNIQUE: Contiguous axial images were obtained from the base of the skull through the vertex without intravenous contrast. RADIATION DOSE REDUCTION: This exam was performed according to the departmental dose-optimization program which includes automated exposure control, adjustment of the mA and/or kV according to patient size and/or use of iterative reconstruction technique. COMPARISON:  12/10/2019 FINDINGS: Brain: No evidence of acute infarction, hemorrhage, mass, mass effect, or midline shift. No hydrocephalus or extra-axial fluid collection. Normal cerebral volume for age. Vascular: No hyperdense vessel. Skull: Negative for fracture or focal lesion. Sinuses/Orbits: Mucosal thickening in the paranasal sinuses, with multifocal air-fluid levels and bubbly fluid. Status post bilateral lens replacements. Other: Fluid in the bilateral mastoid air cells. IMPRESSION: 1. No acute intracranial process. 2. Mucosal thickening in the paranasal sinuses, with multifocal air-fluid levels and bubbly fluid, which can be seen in the setting of acute sinusitis. Electronically Signed   By: Donald Campion M.D.   On: 07/04/2023 16:39   CT CHEST WO CONTRAST Result Date: 07/02/2023 CLINICAL DATA:  Cough, shortness of breath, and chest pain for 1 week. EXAM: CT CHEST WITHOUT CONTRAST TECHNIQUE: Multidetector CT imaging of the chest was performed following the standard protocol without IV contrast. RADIATION DOSE REDUCTION: This exam was performed according to the departmental dose-optimization program which includes automated exposure control, adjustment of the mA and/or kV according to patient size and/or use of iterative reconstruction technique. COMPARISON:  12/14/2021 FINDINGS: Cardiovascular:  Coronary, aortic arch, and branch vessel atherosclerotic vascular disease. Mild cardiomegaly. Dilated main pulmonary artery at 4.8 cm, favoring pulmonary arterial hypertension. Small amount of non dependent gas in the right atrium, most commonly introduced via IV line. In the absence of a right to left shunt this is typically clinically inconsequential. Mediastinum/Nodes: Stable asymmetry of the thyroid  with the right lobe larger than the left. No well-defined mass on today's noncontrast imaging. No pathologic adenopathy. Lungs/Pleura: Part solid 5 mm right apical segment right upper lobe pulmonary nodule, image 32 series 3, not present previously. Substantial atelectasis most of the right middle lobe. Hazy density in the aerated portion of the right middle lobe may likewise be from atelectasis, or alveolitis. There is airway plugging causing occlusion of the right middle lobe bronchi. Atelectasis posteriorly in the right upper lobe. Bandlike atelectasis in the superior segment right lower lobe. Dependent mucus in the right lower lobe bronchus. Mild scarring in the left upper lobe and lingula. Airway plugging in atelectasis in much of the left lower lobe especially medially and posteriorly. Upper Abdomen: Slightly nodular severe liver contour, cannot exclude cirrhosis. Cholecystectomy. Abdominal aortic atherosclerosis. Musculoskeletal: Lower cervical plate and screw fixator. Degenerative right glenohumeral arthropathy. Subacute healing fracture of the right anterior fourth rib. Thoracic spondylosis with multilevel bridging spurring in the thoracic spine anteriorly. IMPRESSION: 1. Substantial atelectasis most of the right middle lobe. Hazy density in the aerated portion of the right middle lobe may likewise be from atelectasis, or alveolitis. There is airway plugging causing occlusion of the right middle lobe bronchi. 2. Airway plugging in atelectasis in much of the left lower lobe especially medially and  posteriorly. 3. Dependent mucus in the right lower lobe bronchus. 4. Dilated main pulmonary artery at 4.8 cm, favoring pulmonary arterial hypertension. 5. Coronary, aortic arch, and branch vessel atherosclerotic vascular disease. 6. Mild cardiomegaly. 7. Slightly nodular severe liver contour, cannot exclude cirrhosis.  8. Subacute healing fracture of the right anterior fourth rib. 9.  Aortic Atherosclerosis (ICD10-I70.0). 10. Part solid 5 mm right apical segment right upper lobe pulmonary nodule not present previously. No follow-up recommended. This recommendation follows the consensus statement: Guidelines for Management of Incidental Pulmonary Nodules Detected on CT Images: From the Fleischner Society 2017; Radiology 2017; 284:228-243. Electronically Signed   By: Ryan Salvage M.D.   On: 07/02/2023 18:36   DG Chest 2 View Result Date: 06/28/2023 CLINICAL DATA:  Cough, shortness of breath and chest pain for 1 week. EXAM: CHEST - 2 VIEW COMPARISON:  05/22/2023 FINDINGS: Exam detail is diminished secondary to patient's body habitus and suboptimal inspiration. Cardiac enlargement and aortic atherosclerosis. Enlargement of the pulmonary arteries compatible with PA hypertension. Opacities are noted within the periphery of the left midlung and both lung bases. Chronic coarsened interstitial markings noted bilaterally compatible with COPD/emphysema. Previous ACDF within the lower cervical spine. IMPRESSION: 1. Opacities within the periphery of the left midlung and both lung bases. Cannot exclude multifocal pneumonia. 2. COPD/emphysema. 3. Enlargement of the pulmonary arteries compatible with PA hypertension. Electronically Signed   By: Waddell Calk M.D.   On: 06/28/2023 17:47   US  ARTERIAL SEG MULTIPLE LE (ABI, SEGMENTAL PRESSURES, PVR'S) Result Date: 06/16/2023 CLINICAL DATA:  Peripheral vascular disease EXAM: NONINVASIVE PHYSIOLOGIC VASCULAR STUDY OF BILATERAL LOWER EXTREMITIES TECHNIQUE: Non-invasive  vascular evaluation of both lower extremities was performed at rest, including calculation of ankle-brachial indices, multiple segmental pressure evaluation, segmental Doppler and segmental pulse volume recording. COMPARISON:  None Available. FINDINGS: Right Lower Extremity Resting ABI:  0.95 Resting TBI: 0.62 Segmental Pressures: Limited utility of segmental pressures due to poor compressibility. Great toe pressure: 92 mm Hg Arterial Waveforms: Normal tri-phasic arterial waveforms. PVRs: Normal PVRs with maintained waveform amplitude, augmentation and quality. Left Lower Extremity: Resting ABI: 0.84 Resting TBI: 1.1 Segmental Pressures: Limited utility of segmental pressures due to poor compressibility. Great toe pressure: 168 mm Hg Arterial Waveforms: Normal tri-phasic arterial waveforms. PVRs: Normal PVRs with maintained waveform amplitude, augmentation and quality. Other: Symmetric upper extremity pressures. Ankle Brachial index > 1.4 Non diagnostic secondary to incompressible vessel calcifications 1.0-1.4       Normal 0.9-0.99     Borderline PAD 0.8-0.89     Mild PAD 0.5-0.79     Moderate PAD < 0.5          Severe PAD Toe Brachial Index Normal     >0.65 Moderate  0.53-0.64 Severe     <0.23 Toe Pressures Absolute toe pressure >44mmHg sufficient for wound healing. Toe pressures <16mmHg = critical limb ischemia. IMPRESSION: 1. Borderline decreased right ankle and toe brachial index consistent with borderline underlying peripheral arterial disease. 2. Mildly decreased resting left ankle-brachial index with a normal toe brachial index also consistent with borderline underlying peripheral arterial disease. 3. Limited utility of segment pressures due to noncompressibility of the vessels at multiple locations. This is likely due to patient body habitus. Electronically Signed   By: Wilkie Lent M.D.   On: 06/16/2023 13:55    Alm Schneider, DO  [facility]  If 7PM-7AM, please contact  night-coverage [email or web address] Password [email or web address] 07/06/2023, 3:56 PM   LOS: 8 days

## 2023-07-06 NOTE — Plan of Care (Signed)
  Problem: Coping: Goal: Ability to adjust to condition or change in health will improve Outcome: Progressing   Problem: Health Behavior/Discharge Planning: Goal: Ability to manage health-related needs will improve Outcome: Progressing   Problem: Skin Integrity: Goal: Risk for impaired skin integrity will decrease Outcome: Progressing

## 2023-07-07 ENCOUNTER — Telehealth: Payer: Self-pay

## 2023-07-07 DIAGNOSIS — J189 Pneumonia, unspecified organism: Secondary | ICD-10-CM

## 2023-07-07 DIAGNOSIS — J9621 Acute and chronic respiratory failure with hypoxia: Secondary | ICD-10-CM | POA: Diagnosis not present

## 2023-07-07 DIAGNOSIS — J441 Chronic obstructive pulmonary disease with (acute) exacerbation: Secondary | ICD-10-CM | POA: Diagnosis not present

## 2023-07-07 LAB — BASIC METABOLIC PANEL
Anion gap: 12 (ref 5–15)
BUN: 33 mg/dL — ABNORMAL HIGH (ref 8–23)
CO2: 29 mmol/L (ref 22–32)
Calcium: 9.1 mg/dL (ref 8.9–10.3)
Chloride: 97 mmol/L — ABNORMAL LOW (ref 98–111)
Creatinine, Ser: 0.9 mg/dL (ref 0.44–1.00)
GFR, Estimated: >60 mL/min (ref >60)
Glucose, Bld: 157 mg/dL — ABNORMAL HIGH (ref 70–99)
Potassium: 3.6 mmol/L (ref 3.5–5.1)
Sodium: 138 mmol/L (ref 135–145)

## 2023-07-07 LAB — GLUCOSE, CAPILLARY
Glucose-Capillary: 142 mg/dL — ABNORMAL HIGH (ref 70–99)
Glucose-Capillary: 171 mg/dL — ABNORMAL HIGH (ref 70–99)
Glucose-Capillary: 291 mg/dL — ABNORMAL HIGH (ref 70–99)

## 2023-07-07 MED ORDER — BISOPROLOL FUMARATE 5 MG PO TABS: 5.0000 mg | ORAL_TABLET | Freq: Every day | ORAL | 1 refills | Status: AC

## 2023-07-07 MED ORDER — PREDNISONE 10 MG PO TABS: 60.0000 mg | ORAL_TABLET | Freq: Every day | ORAL | Status: DC

## 2023-07-07 MED ORDER — PREDNISONE 20 MG PO TABS: 60.0000 mg | ORAL_TABLET | Freq: Every day | ORAL | Status: DC

## 2023-07-07 NOTE — TOC Transition Note (Signed)
 Transition of Care [facility]) - Discharge Note   Patient Details  Name: Ann [name] MRN: [id number] Date of Birth: 04-07-1946  [facility] of [facility] CM/SW Contact:  Juanda Noon Phone Number: 07/07/2023, 10:39 AM   Clinical Narrative:    CSW spoke with patient this morning regarding HHPT. Patient agreeable to [facility] providing HHPT. Gwinda Leopard was contacted this morning regarding pt DC today. Gwinda Leopard shared that she needed a updated PT order. MD was made aware to place new order today. TOC signing off.    Final next level of care: Home w Home Health Services Barriers to Discharge: Barriers Resolved   Patient Goals and CMS Choice Patient states their goals for this hospitalization and ongoing recovery are:: return home CMS Medicare.gov Compare Post Acute Care list provided to:: Patient Choice offered to / list presented to : Patient [place] ownership interest in [facility].provided to::  (n/a)    Discharge Placement                Patient to be transferred to facility by: Pt will be picked up by son to DC home today Name of family member notified: Patient -[name] Patient and family notified of of transfer: 07/07/23  Discharge Plan and Services Additional resources added to the After Visit Summary for   In-house Referral: Clinical Social Work              DME Arranged: N/A DME Agency: NA       HH Arranged: PT HH Agency: [facility] Date HH Agency Contacted: 07/07/23 Time HH Agency Contacted: 1039 Representative spoke with at [facility] Agency: Gwinda Leopard  Social Drivers of Health (SDOH) Interventions SDOH Screenings   Food Insecurity: No Food Insecurity (06/29/2023)  Housing: Low Risk  (06/29/2023)  Transportation Needs: No Transportation Needs (06/29/2023)  Utilities: Not At Risk (06/29/2023)  Alcohol  Screen: Low Risk  (10/22/2022)  Depression (PHQ2-9): Low Risk  (10/30/2022)  Recent Concern: Depression (PHQ2-9) - High Risk (10/16/2022)  Financial  Resource Strain: Low Risk  (10/22/2022)  Physical Activity: Insufficiently Active (10/22/2022)  Social Connections: Unknown (06/29/2023)  Stress: No Stress Concern Present (10/22/2022)  Tobacco Use: Medium Risk (06/28/2023)     Readmission Risk Interventions    07/07/2023   10:35 AM 06/30/2023    2:53 PM 03/22/2023    1:49 PM  Readmission Risk Prevention Plan  Transportation Screening Complete Complete   PCP or Specialist Appt within 3-5 Days   Complete  HRI or Home Care Consult Complete Complete Complete  Social Work Consult for Recovery Care Planning/Counseling Complete Complete Complete  Palliative Care Screening Not Applicable Not Applicable Complete  Medication Review Oceanographer) Complete Complete Complete

## 2023-07-07 NOTE — Progress Notes (Signed)
 Went over discharge instructions w/ pt and family.

## 2023-07-07 NOTE — Discharge Summary (Signed)
 Physician Discharge Summary   Patient: Ann [name] MRN: [id number] DOB: [birth year]  Admit date:     06/28/2023  Discharge date: 07/07/23  Discharge Physician: Myrtie Atkinson Shahir Karen   PCP: Wyvonna Heidelberg, MD   Recommendations at discharge:   Please follow up with primary care provider within 1-2 weeks  Please repeat BMP and CBC in one week     Hospital Course: 78 y.o. female with medical history significant of coronary disease, COPD, chronic hypoxic respiratory failure on 2 L oxygen ,  diastolic CHF, status post prior stents, persistent A-fib, on Eliquis , hypertension, GERD, chronic anticoagulation, history of DVT in the past, diabetes mellitus type 2, NS Vtach, insulin -dependent who presents to the emergency department due to several days of onset of shortness of breath which has been worsening, this was associated with increased cough.  Patient now presents with severe shortness of breath despite being on supplemental oxygen  and could barely take 1 or 2 steps without having to stop to take a breath.  Patient denies diarrhea or vomiting.  O2 sat on arrival to the ED was 80% on 2 LPM.  Patient was placed on 4 L nasal cannula with saturation in the mid 90s.   ED Course:  In the emergency department, she was tachypneic, pulse was 84 bpm on arrival to the ED, BP 106/88, O2 sat was 98 to 94% on supplemental oxygen  via [place] at 4 LPM.  Workup in the ED showed normal CBC except for WBC of 11.0, normal BMP.  Troponin 45 > 43.  Chest x-ray was suggestive of multifocal pneumonia Patient was treated with vancomycin  and Zosyn .  IV Solu-Medrol  25 mg x 1 was given.  Hospitalist was asked to admit patient for further evaluation and management.    Assessment and Plan: Multifocal pneumonia Patient was started on vancomycin  and Zosyn  -d/c vanc as MRSA is neg -added azithromycin --finished 5 days Continue Tylenol  as needed Continue Mucinex , Robitussin, incentive spirometry, flutter valve  -PCT 0.36 -finished  7 days zosyn  2/7   Acute on chronic respiratory failure with hypoxia -due to pneumonia and COPD exacerbation Patient is currently on supplemental oxygen  3 LPM (she uses 2 LPM at home) Continue supplemental oxygen  to maintain O2 sats > 90% and consider weaning patient down to home oxygen  requirement as tolerated. -CT chest--substantial atelectasis RML, hazy density RML; LLL airway plugging; 5 mm RUL nodule; subacute R-4th rib fx -chest physiotherapy -dc home back on 2L which is her baseline   COPD exacerbation -added brovana  -increased pulmicort  -started IV solumedrol>>d/c home with prednisone  taper -add atrovent  nebs>>Yupelri  -viral respiratory panel +metapneumovirus   Elevated troponin Troponin 45 > 43 suspect mild elevation demand ischemia -no chest pain -due to demand ischemia   Morbid obesity (BMI 48.89) Patient was counseled about the cardiovascular and metabolic risk of morbid obesity. Patient was counseled for diet control, exercise regimen and weight loss.    Chronic diastolic CHF Continue total input/output, daily weights and fluid restriction Continue heart healthy diet      Echocardiogram 4/24: normal LVEF, indeterminate DD History of previous DVT Continue apixaban  for full anticoagulation  -lasix  on hold temporarily due to rise in creatinine and soft BPs -2/7--restart lasix    Chronic atrial fibrillation Continue Cardizem ,  apixaban  -holding bisoprolol  due to soft BPs>>restart 2/7 as pt had episode RVR after given lopressor  IV x 1   CAD Patient denies chest pain Continue Cardizem , apixaban   -holding bisoprolol  due to soft BPs   Acquired hypothyroidism Continue levothyroxine     Type  2 diabetes mellitus Hemoglobin A1c done on 06/25/2022 was 8.6 Continue ISS  -70/30 insulin  -- increase to 20 units bid         Consultants: none Procedures performed: none  Disposition: Home Diet recommendation:  Cardiac and Carb modified diet DISCHARGE  MEDICATION: Allergies as of 07/07/2023       Reactions   Ace Inhibitors Other (See Comments)   Reaction unknown   Darrall Ellison ] Other (See Comments)   Causes bleeding   Breztri  Aerosphere [budeson-glycopyrrol-formoterol ]    Tape Other (See Comments)   Skin tearing, causes scars Other Reaction(s): Other (See Comments)   Niacin Rash   Reglan  [metoclopramide ] Anxiety        Medication List     STOP taking these medications    guaiFENesin  600 MG 12 hr tablet Commonly known as: Mucinex        TAKE these medications    acetaminophen  325 MG tablet Commonly known as: TYLENOL  Take 2 tablets (650 mg total) by mouth every 6 (six) hours as needed for mild pain, moderate pain or fever.   albuterol  108 (90 Base) MCG/ACT inhaler Commonly known as: VENTOLIN  HFA Inhale 2 puffs into the lungs every 4 (four) hours as needed for wheezing.   apixaban  5 MG Tabs tablet Commonly known as: ELIQUIS  Take 1 tablet (5 mg total) by mouth 2 (two) times daily.   atorvastatin  80 MG tablet Commonly known as: LIPITOR  TAKE 1 TABLET(80 MG) BY MOUTH DAILY AT 6 PM   augmented betamethasone dipropionate 0.05 % cream Commonly known as: DIPROLENE-AF Apply 1 Application topically daily.   benzonatate  100 MG capsule Commonly known as: TESSALON  Take 1 capsule (100 mg total) by mouth every 8 (eight) hours.   bisoprolol  5 MG tablet Commonly known as: ZEBETA  Take 1 tablet (5 mg total) by mouth daily. Start taking on: July 08, 2023 What changed:  medication strength how much to take   budesonide  0.25 MG/2ML nebulizer solution Commonly known as: Pulmicort  One vial first thing in am and with evening dose of duoneb What changed:  how much to take how to take this when to take this additional instructions   Carboxymethylcellulose Sod PF 0.5 % Soln Place 2 drops into both eyes daily as needed (for dry eye relief).   cyanocobalamin  1000 MCG tablet Take 1 tablet (1,000 mcg total) by mouth  daily. What changed: how much to take   diltiazem  120 MG 24 hr capsule Commonly known as: CARDIZEM  CD Take 1 capsule (120 mg total) by mouth daily.   esomeprazole  40 MG capsule Commonly known as: NexIUM  Take 1 capsule (40 mg total) by mouth 2 (two) times daily before a meal.   furosemide  40 MG tablet Commonly known as: Lasix  Take 1 tablet (40 mg total) by mouth 2 (two) times daily.   gabapentin  400 MG capsule Commonly known as: NEURONTIN  Take 1 capsule (400 mg total) by mouth 3 (three) times daily.   Gemtesa  75 MG Tabs Generic drug: Vibegron  Take 1 tablet (75 mg total) by mouth daily.   glipiZIDE  XL 5 MG 24 hr tablet Generic drug: glipiZIDE  TAKE ONE TABLET BY MOUTH EVERY DAY WITH BREAKFAST What changed: See the new instructions.   hydrocortisone  valerate cream 0.2 % Commonly known as: WESTCORT  Apply topically 2 (two) times daily. Apply to sparingly to both legs for treatment of stasis dermatitis.   Insulin  Lispro Prot & Lispro (75-25) 100 UNIT/ML Kwikpen Commonly known as: HUMALOG  75/25 MIX Inject 50-60 Units into the skin 2 (two) times  daily before a meal. 60 units with breakfast, 50 units with supper   ipratropium 0.03 % nasal spray Commonly known as: ATROVENT  Place 2 sprays into both nostrils every 12 (twelve) hours.   ipratropium-albuterol  0.5-2.5 (3) MG/3ML Soln Commonly known as: DUONEB Take 3 mLs by nebulization 4 (four) times daily.   Iron  (Ferrous Sulfate ) 325 (65 Fe) MG Tabs Take 325 mg by mouth daily.   levothyroxine  175 MCG tablet Commonly known as: SYNTHROID  Take 1 tablet (175 mcg total) by mouth every morning.   nitroGLYCERIN  0.4 MG SL tablet Commonly known as: NITROSTAT  Place 0.4 mg under the tongue every 5 (five) minutes x 3 doses as needed for chest pain (if no relief after 2nd dose, proceed to ED or call 911).   nystatin  powder Commonly known as: MYCOSTATIN /NYSTOP  Apply topically 3 (three) times daily. Apply to to affected areas (groin/skin  folds)   predniSONE  10 MG tablet Commonly known as: DELTASONE  Take 6 tablets (60 mg total) by mouth daily with breakfast. And decrease by one tablet daily Start taking on: July 08, 2023   Salonpas  Pain Relief  Patch Ptch Apply 1 patch topically daily as needed (pain).   Trelegy Ellipta 100-62.5-25 MCG/ACT Aepb Generic drug: Fluticasone -Umeclidin-Vilant Inhale 1 puff into the lungs daily.   triamcinolone  cream 0.1 % Commonly known as: KENALOG  Apply 1 Application topically 2 (two) times daily. What changed:  when to take this reasons to take this   Vitamin D3 125 MCG (5000 UT) Caps Take 1 capsule (5,000 Units total) by mouth daily.        Follow-up Information     [facility] Follow up.   Why: Will contact you to schedule home health visits.               Discharge Exam: Filed Weights   07/05/23 0315 07/06/23 0500 07/07/23 0412  Weight: 112.4 kg 112.9 kg 113.6 kg   HEENT:  [place]/AT, No thrush, no icterus CV:  RRR, no rub, no S3, no S4 Lung:  bibasilar rales.  No wheeze Abd:  soft/+BS, NT Ext:  No edema, no lymphangitis, no synovitis, no rash   Condition at discharge: stable  The results of significant diagnostics from this hospitalization (including imaging, microbiology, ancillary and laboratory) are listed below for reference.   Imaging Studies: CT HEAD WO CONTRAST ( ) Result Date: 07/04/2023 CLINICAL DATA:  Dizziness, headache EXAM: CT HEAD WITHOUT CONTRAST TECHNIQUE: Contiguous axial images were obtained from the base of the skull through the vertex without intravenous contrast. RADIATION DOSE REDUCTION: This exam was performed according to the departmental dose-optimization program which includes automated exposure control, adjustment of the mA and/or kV according to patient size and/or use of iterative reconstruction technique. COMPARISON:  12/10/2019 FINDINGS: Brain: No evidence of acute infarction, hemorrhage, mass, mass effect, or midline  shift. No hydrocephalus or extra-axial fluid collection. Normal cerebral volume for age. Vascular: No hyperdense vessel. Skull: Negative for fracture or focal lesion. Sinuses/Orbits: Mucosal thickening in the paranasal sinuses, with multifocal air-fluid levels and bubbly fluid. Status post bilateral lens replacements. Other: Fluid in the bilateral mastoid air cells. IMPRESSION: 1. No acute intracranial process. 2. Mucosal thickening in the paranasal sinuses, with multifocal air-fluid levels and bubbly fluid, which can be seen in the setting of acute sinusitis. Electronically Signed   By: Zoila Hines M.D.   On: 07/04/2023 16:39   CT CHEST WO CONTRAST Result Date: 07/02/2023 CLINICAL DATA:  Cough, shortness of breath, and chest pain for 1 week. EXAM:  CT CHEST WITHOUT CONTRAST TECHNIQUE: Multidetector CT imaging of the chest was performed following the standard protocol without IV contrast. RADIATION DOSE REDUCTION: This exam was performed according to the departmental dose-optimization program which includes automated exposure control, adjustment of the mA and/or kV according to patient size and/or use of iterative reconstruction technique. COMPARISON:  12/14/2021 FINDINGS: Cardiovascular: Coronary, aortic arch, and branch vessel atherosclerotic vascular disease. Mild cardiomegaly. Dilated main pulmonary artery at 4.8 cm, favoring pulmonary arterial hypertension. Small amount of non dependent gas in the right atrium, most commonly introduced via IV line. In the absence of a right to left shunt this is typically clinically inconsequential. Mediastinum/Nodes: Stable asymmetry of the thyroid  with the right lobe larger than the left. No well-defined mass on today's noncontrast imaging. No pathologic adenopathy. Lungs/Pleura: Part solid 5 mm right apical segment right upper lobe pulmonary nodule, image 32 series 3, not present previously. Substantial atelectasis most of the right middle lobe. Hazy density in the  aerated portion of the right middle lobe may likewise be from atelectasis, or alveolitis. There is airway plugging causing occlusion of the right middle lobe bronchi. Atelectasis posteriorly in the right upper lobe. Bandlike atelectasis in the superior segment right lower lobe. Dependent mucus in the right lower lobe bronchus. Mild scarring in the left upper lobe and lingula. Airway plugging in atelectasis in much of the left lower lobe especially medially and posteriorly. Upper Abdomen: Slightly nodular severe liver contour, cannot exclude cirrhosis. Cholecystectomy. Abdominal aortic atherosclerosis. Musculoskeletal: Lower cervical plate and screw fixator. Degenerative right glenohumeral arthropathy. Subacute healing fracture of the right anterior fourth rib. Thoracic spondylosis with multilevel bridging spurring in the thoracic spine anteriorly. IMPRESSION: 1. Substantial atelectasis most of the right middle lobe. Hazy density in the aerated portion of the right middle lobe may likewise be from atelectasis, or alveolitis. There is airway plugging causing occlusion of the right middle lobe bronchi. 2. Airway plugging in atelectasis in much of the left lower lobe especially medially and posteriorly. 3. Dependent mucus in the right lower lobe bronchus. 4. Dilated main pulmonary artery at 4.8 cm, favoring pulmonary arterial hypertension. 5. Coronary, aortic arch, and branch vessel atherosclerotic vascular disease. 6. Mild cardiomegaly. 7. Slightly nodular severe liver contour, cannot exclude cirrhosis. 8. Subacute healing fracture of the right anterior fourth rib. 9.  Aortic Atherosclerosis (ICD10-I70.0). 10. Part solid 5 mm right apical segment right upper lobe pulmonary nodule not present previously. No follow-up recommended. This recommendation follows the consensus statement: Guidelines for Management of Incidental Pulmonary Nodules Detected on CT Images: From the Fleischner Society 2017; Radiology 2017;  [id number]. Electronically Signed   By: Freida Jes M.D.   On: 07/02/2023 18:36   DG Chest 2 View Result Date: 06/28/2023 CLINICAL DATA:  Cough, shortness of breath and chest pain for 1 week. EXAM: CHEST - 2 VIEW COMPARISON:  05/22/2023 FINDINGS: Exam detail is diminished secondary to patient's body habitus and suboptimal inspiration. Cardiac enlargement and aortic atherosclerosis. Enlargement of the pulmonary arteries compatible with PA hypertension. Opacities are noted within the periphery of the left midlung and both lung bases. Chronic coarsened interstitial markings noted bilaterally compatible with COPD/emphysema. Previous ACDF within the lower cervical spine. IMPRESSION: 1. Opacities within the periphery of the left midlung and both lung bases. Cannot exclude multifocal pneumonia. 2. COPD/emphysema. 3. Enlargement of the pulmonary arteries compatible with PA hypertension. Electronically Signed   By: Kimberley Penman M.D.   On: 06/28/2023 17:47   US  ARTERIAL SEG MULTIPLE LE (ABI, SEGMENTAL  PRESSURES, PVR'S) Result Date: 06/16/2023 CLINICAL DATA:  Peripheral vascular disease EXAM: NONINVASIVE PHYSIOLOGIC VASCULAR STUDY OF BILATERAL LOWER EXTREMITIES TECHNIQUE: Non-invasive vascular evaluation of both lower extremities was performed at rest, including calculation of ankle-brachial indices, multiple segmental pressure evaluation, segmental Doppler and segmental pulse volume recording. COMPARISON:  None Available. FINDINGS: Right Lower Extremity Resting ABI:  0.95 Resting TBI: 0.62 Segmental Pressures: Limited utility of segmental pressures due to poor compressibility. Great toe pressure: 92 mm Hg Arterial Waveforms: Normal tri-phasic arterial waveforms. PVRs: Normal PVRs with maintained waveform amplitude, augmentation and quality. Left Lower Extremity: Resting ABI: 0.84 Resting TBI: 1.1 Segmental Pressures: Limited utility of segmental pressures due to poor compressibility. Great toe pressure: 168 mm  Hg Arterial Waveforms: Normal tri-phasic arterial waveforms. PVRs: Normal PVRs with maintained waveform amplitude, augmentation and quality. Other: Symmetric upper extremity pressures. Ankle Brachial index > 1.4 Non diagnostic secondary to incompressible vessel calcifications 1.0-1.4       Normal 0.9-0.99     Borderline PAD 0.8-0.89     Mild PAD 0.5-0.79     Moderate PAD < 0.5          Severe PAD Toe Brachial Index Normal     >0.65 Moderate  0.53-0.64 Severe     <0.23 Toe Pressures Absolute toe pressure >28mmHg sufficient for wound healing. Toe pressures <68mmHg = critical limb ischemia. IMPRESSION: 1. Borderline decreased right ankle and toe brachial index consistent with borderline underlying peripheral arterial disease. 2. Mildly decreased resting left ankle-brachial index with a normal toe brachial index also consistent with borderline underlying peripheral arterial disease. 3. Limited utility of segment pressures due to noncompressibility of the vessels at multiple locations. This is likely due to patient body habitus. Electronically Signed   By: Fernando Hoyer M.D.   On: 06/16/2023 13:55    Microbiology: Results for orders placed or performed during the hospital encounter of 06/28/23  Resp panel by RT-PCR (RSV, Flu A&B, Covid) Anterior Nasal Swab     Status: None   Collection Time: 06/28/23  5:03 PM   Specimen: Anterior Nasal Swab  Result Value Ref Range Status   SARS Coronavirus 2 by RT PCR NEGATIVE NEGATIVE Final    Comment: (NOTE) SARS-CoV-2 target nucleic acids are NOT DETECTED.  The SARS-CoV-2 RNA is generally detectable in upper respiratory specimens during the acute phase of infection. The lowest concentration of SARS-CoV-2 viral copies this assay can detect is 138 copies/mL. A negative result does not preclude SARS-Cov-2 infection and should not be used as the sole basis for treatment or other patient management decisions. A negative result may occur with  improper specimen  collection/handling, submission of specimen other than nasopharyngeal swab, presence of viral mutation(s) within the areas targeted by this assay, and inadequate number of viral copies(<138 copies/mL). A negative result must be combined with clinical observations, patient history, and epidemiological information. The expected result is Negative.  Fact Sheet for Patients:  BloggerCourse.com  Fact Sheet for Healthcare Providers:  SeriousBroker.it  This test is no t yet approved or cleared by the [place]  FDA and  has been authorized for detection and/or diagnosis of SARS-CoV-2 by FDA under an Emergency Use Authorization (EUA). This EUA will remain  in effect (meaning this test can be used) for the duration of the COVID-19 declaration under Section 564(b)(1) of the Act, 21 U.S.C.section 360bbb-3(b)(1), unless the authorization is terminated  or revoked sooner.       Influenza A by PCR NEGATIVE NEGATIVE Final   Influenza B by  PCR NEGATIVE NEGATIVE Final    Comment: (NOTE) The Xpert Xpress SARS-CoV-2/FLU/RSV plus assay is intended as an aid in the diagnosis of influenza from Nasopharyngeal swab specimens and should not be used as a sole basis for treatment. Nasal washings and aspirates are unacceptable for Xpert Xpress SARS-CoV-2/FLU/RSV testing.  Fact Sheet for Patients: BloggerCourse.com  Fact Sheet for Healthcare Providers: SeriousBroker.it  This test is not yet approved or cleared by the [place]  FDA and has been authorized for detection and/or diagnosis of SARS-CoV-2 by FDA under an Emergency Use Authorization (EUA). This EUA will remain in effect (meaning this test can be used) for the duration of the COVID-19 declaration under Section 564(b)(1) of the Act, 21 U.S.C. section 360bbb-3(b)(1), unless the authorization is terminated or revoked.     Resp Syncytial  Virus by PCR NEGATIVE NEGATIVE Final    Comment: (NOTE) Fact Sheet for Patients: BloggerCourse.com  Fact Sheet for Healthcare Providers: SeriousBroker.it  This test is not yet approved or cleared by the [place]  FDA and has been authorized for detection and/or diagnosis of SARS-CoV-2 by FDA under an Emergency Use Authorization (EUA). This EUA will remain in effect (meaning this test can be used) for the duration of the COVID-19 declaration under Section 564(b)(1) of the Act, 21 U.S.C. section 360bbb-3(b)(1), unless the authorization is terminated or revoked.  Performed at [facility], [address], [place], [place] [address]   Blood culture (routine x 2)     Status: None   Collection Time: 06/28/23  8:03 PM   Specimen: BLOOD RIGHT HAND  Result Value Ref Range Status   Specimen Description BLOOD RIGHT HAND  Final   Special Requests   Final    BOTTLES DRAWN AEROBIC ONLY Blood Culture adequate volume   Culture   Final    NO GROWTH 5 DAYS Performed at [facility], [address] [place], [place] [address]    Report Status 07/03/2023 FINAL  Final  Blood culture (routine x 2)     Status: None   Collection Time: 06/28/23  8:10 PM   Specimen: Left Antecubital; Blood  Result Value Ref Range Status   Specimen Description LEFT ANTECUBITAL  Final   Special Requests   Final    BOTTLES DRAWN AEROBIC ONLY Blood Culture adequate volume   Culture   Final    NO GROWTH 5 DAYS Performed at [facility], [address] [place], [place] [address]    Report Status 07/03/2023 FINAL  Final  MRSA Next Gen by PCR, Nasal     Status: None   Collection Time: 06/29/23  7:56 AM   Specimen: Nasal Mucosa; Nasal Swab  Result Value Ref Range Status   MRSA by PCR Next Gen NOT DETECTED NOT DETECTED Final    Comment: (NOTE) The GeneXpert MRSA Assay (FDA approved for NASAL specimens only), is one component of a comprehensive MRSA colonization  surveillance program. It is not intended to diagnose MRSA infection nor to guide or monitor treatment for MRSA infections. Test performance is not FDA approved in patients less than 4 years old. Performed at [facility], [address] [place], [place] [address]   Respiratory (~20 pathogens) panel by PCR     Status: Abnormal   Collection Time: 07/02/23  3:20 PM   Specimen: Nasopharyngeal Swab; Respiratory  Result Value Ref Range Status   Adenovirus NOT DETECTED NOT DETECTED Final   Coronavirus 229E NOT DETECTED NOT DETECTED Final    Comment: (NOTE) The Coronavirus on the Respiratory  Panel, DOES NOT test for the novel  Coronavirus (2019 nCoV)    Coronavirus HKU1 NOT DETECTED NOT DETECTED Final   Coronavirus NL63 NOT DETECTED NOT DETECTED Final   Coronavirus OC43 NOT DETECTED NOT DETECTED Final   Metapneumovirus DETECTED (A) NOT DETECTED Final   Rhinovirus / Enterovirus NOT DETECTED NOT DETECTED Final   Influenza A NOT DETECTED NOT DETECTED Final   Influenza B NOT DETECTED NOT DETECTED Final   Parainfluenza Virus 1 NOT DETECTED NOT DETECTED Final   Parainfluenza Virus 2 NOT DETECTED NOT DETECTED Final   Parainfluenza Virus 3 NOT DETECTED NOT DETECTED Final   Parainfluenza Virus 4 NOT DETECTED NOT DETECTED Final   Respiratory Syncytial Virus NOT DETECTED NOT DETECTED Final   Bordetella pertussis NOT DETECTED NOT DETECTED Final   Bordetella Parapertussis NOT DETECTED NOT DETECTED Final   Chlamydophila pneumoniae NOT DETECTED NOT DETECTED Final   Mycoplasma pneumoniae NOT DETECTED NOT DETECTED Final    Comment: Performed at [facility], [address], [place], [place] [address]    Labs: CBC: Recent Labs  Lab 07/02/23 0439  WBC 7.0  HGB 13.2  HCT 42.5  MCV 95.7  PLT 217   Basic Metabolic Panel: Recent Labs  Lab 07/02/23 0439 07/04/23 0414 07/05/23 0518 07/06/23 0602 07/07/23 0239  NA 138 134* 136 137 138  K 3.8 3.9 4.0 3.9 3.6  CL 95* 93* 94* 94* 97*  CO2  34* 31 31 31 29   GLUCOSE 107* 253* 241* 282* 157*  BUN 19 25* 28* 31* 33*  CREATININE 0.98 0.91 0.88 0.83 0.90  CALCIUM  8.5* 9.2 9.3 9.2 9.1  MG 1.9 2.1 2.1 2.2  --    Liver Function Tests: No results for input(s): "AST", "ALT", "ALKPHOS", "BILITOT", "PROT", "ALBUMIN " in the last 168 hours. CBG: Recent Labs  Lab 07/06/23 1606 07/06/23 2041 07/07/23 0311 07/07/23 0802 07/07/23 1130  GLUCAP 302* 328* 142* 171* 291*    Discharge time spent: greater than 30 minutes.  Signed: Demaris Fillers, MD [facility] 07/07/2023

## 2023-07-07 NOTE — Plan of Care (Signed)
  Problem: Coping: Goal: Ability to adjust to condition or change in health will improve Outcome: Completed/Met   Problem: Health Behavior/Discharge Planning: Goal: Ability to manage health-related needs will improve Outcome: Completed/Met   Problem: Metabolic: Goal: Ability to maintain appropriate glucose levels will improve Outcome: Completed/Met   Problem: Skin Integrity: Goal: Risk for impaired skin integrity will decrease Outcome: Completed/Met   Problem: Tissue Perfusion: Goal: Adequacy of tissue perfusion will improve Outcome: Completed/Met   Problem: Health Behavior/Discharge Planning: Goal: Ability to manage health-related needs will improve Outcome: Completed/Met   Problem: Clinical Measurements: Goal: Respiratory complications will improve Outcome: Completed/Met

## 2023-07-07 NOTE — Care Management Important Message (Signed)
 Important Message  Patient Details  Name: KAREL GOULET MRN: [id number] Date of Birth: 06/21/1945   Important Message Given:  Yes - Medicare IM (reviewed letter telephonically at [phone])     Neila Bally 07/07/2023, 11:26 AM

## 2023-07-08 ENCOUNTER — Telehealth: Payer: Self-pay | Admitting: *Deleted

## 2023-07-08 ENCOUNTER — Telehealth: Payer: Self-pay

## 2023-07-08 DIAGNOSIS — J440 Chronic obstructive pulmonary disease with acute lower respiratory infection: Secondary | ICD-10-CM | POA: Diagnosis not present

## 2023-07-08 DIAGNOSIS — J441 Chronic obstructive pulmonary disease with (acute) exacerbation: Secondary | ICD-10-CM | POA: Diagnosis not present

## 2023-07-08 DIAGNOSIS — J9621 Acute and chronic respiratory failure with hypoxia: Secondary | ICD-10-CM | POA: Diagnosis not present

## 2023-07-08 DIAGNOSIS — Z6841 Body Mass Index (BMI) 40.0 and over, adult: Secondary | ICD-10-CM | POA: Diagnosis not present

## 2023-07-08 DIAGNOSIS — I4819 Other persistent atrial fibrillation: Secondary | ICD-10-CM | POA: Diagnosis not present

## 2023-07-08 DIAGNOSIS — I5032 Chronic diastolic (congestive) heart failure: Secondary | ICD-10-CM | POA: Diagnosis not present

## 2023-07-08 DIAGNOSIS — I110 Hypertensive heart disease with heart failure: Secondary | ICD-10-CM | POA: Diagnosis not present

## 2023-07-08 DIAGNOSIS — J189 Pneumonia, unspecified organism: Secondary | ICD-10-CM | POA: Diagnosis not present

## 2023-07-08 DIAGNOSIS — E1142 Type 2 diabetes mellitus with diabetic polyneuropathy: Secondary | ICD-10-CM | POA: Diagnosis not present

## 2023-07-08 NOTE — Transitions of Care (Post Inpatient/ED Visit) (Signed)
   07/08/2023  Name: Ann [name] MRN: [id number] DOB: [birth year]  Today's TOC FU Call Status: Today's TOC FU Call Status:: Unsuccessful Call (1st Attempt) Unsuccessful Call (1st Attempt) Date: 07/08/23  Attempted to reach the patient regarding the most recent Inpatient/ED visit. Patient was called in an Outreach attempt to offer VBCI  30-day TOC program. Pt is eligible for program due to potential risk for readmission and/or high utilization. Unfortunately, I was not able to speak with the patient in regards to recent hospital discharge     Patient's voicemail has  a generic greeting. To maintain HIPAA compliance, left message with VBCI CM contact information only  and a request for a call back .   Follow Up Plan: Additional outreach attempts will be made to reach the patient to complete the Transitions of Care (Post Inpatient/ED visit) call.   Susa Loffler , BSN, RN [facility]   VBCI-Population Health RN Care Manager Direct Dial [phone]  Fax: [phone] Website: [email or web address]

## 2023-07-08 NOTE — Progress Notes (Signed)
Complex Care Management Note Care Guide Note  07/08/2023 Name: Ann [name] MRN: [id number] DOB: Dec 30, 1945   Complex Care Management Outreach Attempts: An unsuccessful telephone outreach was attempted today to offer the patient information about available complex care management services.  Follow Up Plan:  Additional outreach attempts will be made to offer the patient complex care management information and services.   Encounter Outcome:  No Answer  Gwenevere Ghazi  [facility]  [facility], [facility] Guide  Direct Dial: [phone]  Fax [phone]

## 2023-07-08 NOTE — Progress Notes (Signed)
Complex Care Management Note  Care Guide Note 07/08/2023 Name: Ann [name] MRN: [id number] DOB: December 19, 1945  [name] is a 78 y.o. year old female who sees Fanta, Wayland Salinas, MD for primary care. I reached out to Marvis Moeller by phone today to offer complex care management services.  [name] was given information about Complex Care Management services today including:   The Complex Care Management services include support from the care team which includes your Nurse Care Manager, Clinical Social Worker, or Pharmacist.  The Complex Care Management team is here to help remove barriers to the health concerns and goals most important to you. Complex Care Management services are voluntary, and the patient may decline or stop services at any time by request to their care team member.   Complex Care Management Consent Status: Patient agreed to services and verbal consent obtained.   Follow up plan:  Telephone appointment with complex care management team member scheduled for:  07/09/23  Encounter Outcome:  Patient Scheduled Gwenevere Ghazi  [facility]  [facility] Guide  Direct Dial: [phone]  Fax [phone]

## 2023-07-09 ENCOUNTER — Encounter: Payer: Self-pay | Admitting: *Deleted

## 2023-07-09 ENCOUNTER — Ambulatory Visit: Payer: Self-pay | Admitting: *Deleted

## 2023-07-09 ENCOUNTER — Telehealth: Payer: Self-pay

## 2023-07-09 NOTE — Transitions of Care (Post Inpatient/ED Visit) (Signed)
   07/09/2023  Name: Ann [name] MRN: [id number] DOB: [birth year]  Today's TOC FU Call Status: Today's TOC FU Call Status:: Unsuccessful Call (2nd Attempt) Unsuccessful Call (1st Attempt) Date: 07/08/23 Unsuccessful Call (2nd Attempt) Date: 07/09/23  Attempted to reach the patient regarding the most recent Inpatient/ED visit. Patient was called in an Outreach attempt to offer VBCI  30-day TOC program. Pt is eligible for program due to potential risk for readmission and/or high utilization. Unfortunately, I was not able to speak with the patient in regards to recent hospital discharge   Left a HIPAA compliant phone message message for patient including VBCI CM contact information with request for a call back in regard to recent hospital discharge  Patient is currently followed by VBCI CCM Case Management with follow-up call scheduled 2/12  Follow Up Plan: Additional outreach attempts will be made to reach the patient to complete the Transitions of Care (Post Inpatient/ED visit) call.   Susa Loffler , BSN, RN [facility]   VBCI-Population Health RN Care Manager Direct Dial [phone]  Fax: [phone] Website: [email or web address]

## 2023-07-09 NOTE — Patient Outreach (Signed)
Care Coordination   Initial Visit Note   07/09/2023 Name: Ann [name] MRN: [id number] DOB: June 18, 1945  Ann [name] is a 78 y.o. year old female who sees Ann [name], Ann Salinas, MD for primary care. I spoke with  Ann [name] by phone today.  What matters to the patients health and wellness today?  Improving strength and avoiding hospitalizations     Goals Addressed             This Visit's Progress    Manage CHF       Care Coordination Goals: Patient will take medications as directed Patient will schedule a hospital follow-up with PCP Patient will notify provider of any new or worsening symptoms or seek emergency medical attention if necessary Patient will check and record blood pressure daily and reach out to provider with any readings outside of recommended range Patient will purchase new batteries for scales and will check and record weight each morning after urinating and will call cardiologist with weight gain of greater than 2 lbs overnight of more than 3 lbs over 2 to 3 days Patient will monitor fluid retention and will call provider if she develops any swelling in hands, feet, or stomach Patient will follow a low sodium heart healthy diet Patient will call RN Care Manager [phone] with any resource or care coordination needs      Manage COPD       Care Coordination Goals: Patient will schedule a hospital follow-up appointment with PCP Patient will call provider with any new or worsening symptoms or will seek emergency medical attention if necessary Patient will track and manage COPD triggers Patient will state understanding of proper use of medications used for management of COPD including inhalers Patient will engage in light exercise as tolerated 3-5 days a week to aid in the the management of COPD Patient will use walker for ambulation Provided will use infection prevention strategies to reduce risk of respiratory infection Patient will state  understanding of the importance of adequate rest and management of fatigue with COPD Patient will continue to use O2 at 2 L per minute  Patient will call RN Care Manager [phone] with any care coordination or resource needs          SDOH assessments and interventions completed:  Yes  SDOH Interventions Today    Flowsheet Row Most Recent Value  SDOH Interventions   Food Insecurity Interventions Intervention Not Indicated  Housing Interventions Intervention Not Indicated  Transportation Interventions Patient Resources (Friends/Family)  Physical Activity Interventions Other (Comments)  [HHPT will start next week]  Social Connections Interventions Intervention Not Indicated  Health Literacy Interventions Intervention Not Indicated        Care Coordination Interventions:  Yes, provided  Interventions Today    Flowsheet Row Most Recent Value  Chronic Disease   Chronic disease during today's visit Congestive Heart Failure (CHF), Chronic Obstructive Pulmonary Disease (COPD), Atrial Fibrillation (AFib), Diabetes, Hypertension (HTN)  [Hospitalized for multifocal pneumonia]  General Interventions   General Interventions Discussed/Reviewed General Interventions Discussed, General Interventions Reviewed, Labs, Communication with, Durable Medical Equipment (DME), Doctor Visits, Vaccines  [reviewed and discussed inpatient and discharge notes from 07/07/23]  Labs Hgb A1c every 3 months, Kidney Function  [repeat BMP and CBC in 1 week per discharge instructions]  Vaccines COVID-19, Flu, Pneumonia, RSV, Tetanus/Pertussis/Diphtheria  Doctor Visits Discussed/Reviewed Doctor Visits Discussed, Doctor Visits Reviewed, PCP, Specialist  Durable Medical Equipment (DME) Bed side commode, Wheelchair, BP Cuff, Glucomoter, Oxygen, Dan Humphreys, Other  [Scales-Needs  to replace batteries]  Wheelchair Standard  PCP/Specialist Visits Compliance with follow-up visit  [call PCP to schedule a hospital F/U Appt.  Ann [name] (cancer center) 08/13/23, Dr Ronne Binning (urology) 08/27/23, Dr Wyline Mood (cardiology) 10/21/23, Dr Fransico Him (endocrinology) 10/27/23]  Communication with RN  Domenica Fail messaging with Tiffany Kocher, RN Transition of Care regarding [facility] call]  Exercise Interventions   Exercise Discussed/Reviewed Physical Activity  [Patient uses rolling walker for ambulation. Able to perform ADLs but has assistance from sons and grandson if needed. [facility] did assessment yesterday. Will start HHPT next week.]  Physical Activity Discussed/Reviewed Physical Activity Reviewed, Physical Activity Discussed  [encouraged to increase physical activity as tolerated and to rest as needed]  Education Interventions   Education Provided Provided Education  Provided Verbal Education On Nutrition, Labs, Exercise, Medication, When to see the doctor, Other, Mental Health/Coping with Illness  [check and record blood pressure at least 3 times per week. Take log to office visits for reivew. Check and record weight each morning after urinating and call provider with weight gain over 2 lbs overnight or over 3 lbs in 2 days.]  Labs Reviewed Hgb A1c, Kidney Function  [inpatient CBC, BMP]  Mental Health Interventions   Mental Health Discussed/Reviewed Mental Health Discussed, Mental Health Reviewed, Coping Strategies  [Patient has good family support. Denies any mental health concerns at this time.]  Nutrition Interventions   Nutrition Discussed/Reviewed Nutrition Discussed, Nutrition Reviewed, Fluid intake, Adding fruits and vegetables, Carbohydrate meal planning, Portion sizes, Decreasing salt, Decreasing sugar intake  [Patient has a good appetite. Son or grandson prepares most meals. Encouraged low sodium, heart healthy diet and to limit simple cabohydrates and sugars.]  Pharmacy Interventions   Pharmacy Dicussed/Reviewed Pharmacy Topics Discussed, Pharmacy Topics Reviewed, Medications and their functions  [Completed antibiotics while  hospitalized. Taking prednisone & routine medications as prescribed. Per patient, she was told to take imodium for loose stools. Recommended probiotics. Encouraged to talk with community pharmacist before starting.]  Safety Interventions   Safety Discussed/Reviewed Safety Discussed, Safety Reviewed, Fall Risk, Home Safety  [move carefully and change positions slowly to decrease risk for falls.]  Home Safety Assistive Devices  Advanced Directive Interventions   Advanced Directives Discussed/Reviewed Advanced Directives Reviewed, Advanced Directives Discussed, Advanced Care Planning  [documents on file]       Follow up plan:  Patient will talk with [facility] Nurse and schedule RN Care Manager follow-up appointment during that call. If TOC call is unsuccessful, RN Care Manager will reach out to schedule 1 week follow-up call.      Encounter Outcome:  Patient Visit Completed   Demetrios Loll, RN, BSN [place]  [facility], [facility] RN Care Manager Direct Dial: [phone]

## 2023-07-10 ENCOUNTER — Telehealth: Payer: Self-pay

## 2023-07-10 NOTE — Transitions of Care (Post Inpatient/ED Visit) (Addendum)
07/10/2023  Name: Ann [name] MRN: [id number] DOB: 03-01-1946  Today's TOC FU Call Status: Today's TOC FU Call Status:: Successful TOC FU Call Completed Unsuccessful Call (1st Attempt) Date: 07/08/23 Unsuccessful Call (2nd Attempt) Date: 07/09/23 [facility] FU Call Complete Date: 07/10/23 Patient's Name and Date of Birth confirmed.  Transition Care Management Follow-up Telephone Call Date of Discharge: 07/07/23 [facility] Type of Discharge: Inpatient Admission Primary Inpatient Discharge Diagnosis:: Multifocal pneumonia How have you been since you were released from the hospital?: Better Any questions or concerns?: No  Items Reviewed: Did you receive and understand the discharge instructions provided?: Yes Medications obtained,verified, and reconciled?: Yes (Medications Reviewed) Any new allergies since your discharge?: No Dietary orders reviewed?: Yes Type of Diet Ordered:: Reg Heart Healthy Carb Modified Do you have support at home?: Yes Name of Support/Comfort Primary Source: Spouse Halford Chessman Fredirick Lathe, DIL's  Medications Reviewed Today: Medications Reviewed Today     Reviewed by Johnnette Barrios, RN (Registered Nurse) on 07/10/23 at 1104  Med List Status: <None>   Medication Order Taking? Sig Documenting Provider Last Dose Status Informant  acetaminophen (TYLENOL) 325 MG tablet [id number] Yes Take 2 tablets (650 mg total) by mouth every 6 (six) hours as needed for mild pain, moderate pain or fever. Shon Hale, MD Taking Active Self           Med Note Dareen Piano, Isabelle Course M   Wed Apr 16, 2023  2:17 PM)    albuterol (VENTOLIN HFA) 108 [id number] Base) MCG/ACT inhaler [id number] Yes Inhale 2 puffs into the lungs every 4 (four) hours as needed for wheezing. Charlton Amor, PA-C Taking Active Self  apixaban (ELIQUIS) 5 MG TABS tablet [id number] Yes Take 1 tablet (5 mg total) by mouth 2 (two) times daily. Antoine Poche, MD Taking Active Self   atorvastatin (LIPITOR) 80 MG tablet [id number] Yes TAKE 1 TABLET(80 MG) BY MOUTH DAILY AT 6 PM Branch, Dorothe Pea, MD Taking Active Self  augmented betamethasone dipropionate (DIPROLENE-AF) 0.05 % cream [id number] Yes Apply 1 Application topically daily. [provider] Taking Active Self  benzonatate (TESSALON) 100 MG capsule [id number] No Take 1 capsule (100 mg total) by mouth every 8 (eight) hours.  Patient not taking: Reported on 06/30/2023   Abran Cantor, NP Not Taking Active Self  bisoprolol (ZEBETA) 5 MG tablet [id number] Yes Take 1 tablet (5 mg total) by mouth daily. Catarina Hartshorn, MD Taking Active   budesonide (PULMICORT) 0.25 MG/2ML nebulizer solution [id number] Yes One vial first thing in am and with evening dose of duoneb  Patient taking differently: Take 0.25 mg by nebulization in the morning and at bedtime.   Nyoka Cowden, MD Taking Active Self  Carboxymethylcellulose Sod PF 0.5 % SOLN [id number] Yes Place 2 drops into both eyes daily as needed (for dry eye relief).  [provider] Taking Active Self  Cholecalciferol (VITAMIN D3) 125 MCG (5000 UT) CAPS [id number] Yes Take 1 capsule (5,000 Units total) by mouth daily. Charlton Amor, PA-C Taking Active Self  cyanocobalamin 1000 MCG tablet [id number] Yes Take 1 tablet (1,000 mcg total) by mouth daily.  Patient taking differently: Take 2,500 mcg by mouth daily.   Charlton Amor, PA-C Taking Active Self  diltiazem (CARDIZEM CD) 120 MG 24 hr capsule [id number] Yes Take 1 capsule (120 mg total) by mouth daily. Antoine Poche, MD Taking Active Self  esomeprazole (NEXIUM) 40 MG capsule [id number] Yes Take 1 capsule (40 mg total)  by mouth 2 (two) times daily before a meal. Lanelle Bal, DO Taking Active Self  furosemide (LASIX) 40 MG tablet [id number] Yes Take 1 tablet (40 mg total) by mouth 2 (two) times daily. Antoine Poche, MD Taking Active Self  gabapentin (NEURONTIN) 400 MG capsule [id number] Yes  Take 1 capsule (400 mg total) by mouth 3 (three) times daily. Charlton Amor, PA-C Taking Active Self  GLIPIZIDE XL 5 MG 24 hr tablet [id number] Yes TAKE ONE TABLET BY MOUTH EVERY DAY WITH BREAKFAST  Patient taking differently: Take 5 mg by mouth daily with breakfast.   Roma Kayser, MD Taking Active Self  hydrocortisone valerate cream (WESTCORT) 0.2 % [id number] Yes Apply topically 2 (two) times daily. Apply to sparingly to both legs for treatment of stasis dermatitis. Vassie Loll, MD Taking Active Self  Insulin Lispro Prot & Lispro (HUMALOG 75/25 MIX) (75-25) 100 UNIT/ML Stephanie Coup [id number] Yes Inject 50-60 Units into the skin 2 (two) times daily before a meal. 60 units with breakfast, 50 units with supper Roma Kayser, MD Taking Active Self  ipratropium (ATROVENT) 0.03 % nasal spray [id number] Yes Place 2 sprays into both nostrils every 12 (twelve) hours. Leath-Warren, Sadie Haber, NP Taking Active Self  ipratropium-albuterol (DUONEB) 0.5-2.5 (3) MG/3ML SOLN [id number] Yes Take 3 mLs by nebulization 4 (four) times daily. Nyoka Cowden, MD Taking Active Self  Iron, Ferrous Sulfate, 325 (65 Fe) MG TABS [id number] Yes Take 325 mg by mouth daily. Del Newman Nip, Tenna Child, FNP Taking Active Self  levothyroxine (SYNTHROID) 175 MCG tablet [id number] Yes Take 1 tablet (175 mcg total) by mouth every morning. Roma Kayser, MD Taking Active Self  Menthol-Methyl Salicylate [facility] PAIN RELIEF PATCH) PTCH [id number] Yes Apply 1 patch topically daily as needed (pain). [provider] Taking Active Self  nitroGLYCERIN (NITROSTAT) 0.4 MG SL tablet [id number] Yes Place 0.4 mg under the tongue every 5 (five) minutes x 3 doses as needed for chest pain (if no relief after 2nd dose, proceed to ED or call 911). [provider] Taking Active Self  nystatin (MYCOSTATIN/NYSTOP) powder [id number] Yes Apply topically 3 (three) times daily. Apply to to affected areas (groin/skin folds)  Azucena Fallen, MD Taking Active Self  predniSONE (DELTASONE) 10 MG tablet [id number] Yes Take 6 tablets (60 mg total) by mouth daily with breakfast. And decrease by one tablet daily Tat, [name], MD Taking Active   TRELEGY ELLIPTA 100-62.5-25 MCG/ACT AEPB [id number] Yes Inhale 1 puff into the lungs daily. [provider] Taking Active Self  triamcinolone cream (KENALOG) 0.1 % [id number] Yes Apply 1 Application topically 2 (two) times daily.  Patient taking differently: Apply 1 Application topically 2 (two) times daily as needed (for skin rash/irritation).   Raymondo Band, MD Taking Active Self  Vibegron (GEMTESA) 75 MG TABS [id number] Yes Take 1 tablet (75 mg total) by mouth daily. McKenzie, Mardene Celeste, MD Taking Active Self          Medication reconciliation / review completed based on most recent discharge summary and EHR medication list. Confirmed patient is taking all newly prescribed medications as instructed (any discrepancies are noted in review section)   Patient / Caregiver is aware of any changes to and / or  any dosage adjustments to medication regimen. Patient/ Caregiver denies questions at this time and reports no barriers to medication adherence.   Home Care and Equipment/Supplies: Were Home Health Services Ordered?: Yes Name of Home Health Agency:: [facility]  [phone] Has Agency set  up a time to come to your home?: Yes First Home Health Visit Date: 07/09/23  Functional Questionnaire: Do you need assistance with bathing/showering or dressing?: No Do you need assistance with meal preparation?: No Do you need assistance with eating?: No Do you have difficulty maintaining continence: No Do you need assistance with getting out of bed/getting out of a chair/moving?: Yes (SBA) Do you have difficulty managing or taking your medications?: No  Follow up appointments reviewed: PCP Follow-up appointment confirmed?: Yes Date of PCP follow-up appointment?: 07/09/23  (rescheduled visit) Follow-up Provider: [facility] Follow-up appointment confirmed?: Yes Date of Specialist follow-up appointment?: 08/13/23 Follow-Up Specialty Provider:: Oncology 3/19, Urology 4/2, Cardiology 5/27 Do you need transportation to your follow-up appointment?: No Do you understand care options if your condition(s) worsen?: Yes-patient verbalized understanding  SDOH Interventions Today    Flowsheet Row Most Recent Value  SDOH Interventions   Food Insecurity Interventions Intervention Not Indicated  Housing Interventions Intervention Not Indicated  Transportation Interventions Intervention Not Indicated, Patient Resources (Friends/Family)  Utilities Interventions Intervention Not Indicated      Interventions Today    Flowsheet Row Most Recent Value  General Interventions   General Interventions Discussed/Reviewed General Interventions Discussed, General Interventions Reviewed, Durable Medical Equipment (DME), Doctor Visits  Doctor Visits Discussed/Reviewed Doctor Visits Reviewed, Doctor Visits Discussed, PCP, Specialist  Durable Medical Equipment (DME) Bed side commode, BP Cuff, Glucomoter, Environmental consultant, Psychologist, forensic  PCP/Specialist Visits Compliance with follow-up visit  Communication with RN  Exercise Interventions   Exercise Discussed/Reviewed Physical Activity  Physical Activity Discussed/Reviewed Physical Activity Reviewed, Physical Activity Discussed  Education Interventions   Education Provided Provided Education  Provided Verbal Education On Nutrition, Foot Care, Blood Sugar Monitoring, Mental Health/Coping with Illness, When to see the doctor  Mental Health Interventions   Mental Health Discussed/Reviewed Mental Health Discussed, Coping Strategies  Nutrition Interventions   Nutrition Discussed/Reviewed Nutrition Discussed, Nutrition Reviewed, Carbohydrate meal planning  Pharmacy Interventions   Pharmacy Dicussed/Reviewed  Medications and their functions, Medication Adherence  Safety Interventions   Safety Discussed/Reviewed Safety Reviewed, Fall Risk       Benefits reviewed  Based on current information and Insurance plan -Reviewed benefits accessible to patient, including details about eligibility options for care and  available value based care options  if any areas of needs were identified.  Reviewed patient/  caregiver's ability to access and / or  ability with navigating the benefits system..Amb Referral made if indicted , refer to orders section of note for details   Reviewed goals for care Patient/ Caregiver  verbalizes understanding of instructions and care plan provided. Patient / Caregiver was encouraged to make informed decisions about their care, actively participate in managing their health condition, and implement lifestyle changes as needed to promote independence and self-management of health care. There were no reported  barriers to care.   TOC program  Patient is at high risk for readmission and/or has history of  high utilization  Discussed VBCI  TOC program and weekly calls to patient to assess condition/status, medication management  and provide support/education as indicated . Patient/ Caregiver voiced understanding and declined enrollment in the 30-day TOC Program.    She is currently followed by VBCI CCM  with visit scheduled 07/15/23 She is followed by [facility] and feels she has strong support at home    The patient has been provided with contact information for the care management team and has been advised to call with any health-related questions or  concerns. Follow up as indicated with Care Team , or sooner should any new problems arise.     Susa Loffler , BSN, RN [facility]   VBCI-Population Health RN Care Manager Direct Dial [phone]  Fax: [phone] Website: [email or web address]

## 2023-07-14 DIAGNOSIS — J9621 Acute and chronic respiratory failure with hypoxia: Secondary | ICD-10-CM | POA: Diagnosis not present

## 2023-07-14 DIAGNOSIS — J189 Pneumonia, unspecified organism: Secondary | ICD-10-CM | POA: Diagnosis not present

## 2023-07-14 DIAGNOSIS — E1142 Type 2 diabetes mellitus with diabetic polyneuropathy: Secondary | ICD-10-CM | POA: Diagnosis not present

## 2023-07-14 DIAGNOSIS — J440 Chronic obstructive pulmonary disease with acute lower respiratory infection: Secondary | ICD-10-CM | POA: Diagnosis not present

## 2023-07-14 DIAGNOSIS — I4819 Other persistent atrial fibrillation: Secondary | ICD-10-CM | POA: Diagnosis not present

## 2023-07-14 DIAGNOSIS — Z6841 Body Mass Index (BMI) 40.0 and over, adult: Secondary | ICD-10-CM | POA: Diagnosis not present

## 2023-07-14 DIAGNOSIS — J441 Chronic obstructive pulmonary disease with (acute) exacerbation: Secondary | ICD-10-CM | POA: Diagnosis not present

## 2023-07-14 DIAGNOSIS — I110 Hypertensive heart disease with heart failure: Secondary | ICD-10-CM | POA: Diagnosis not present

## 2023-07-14 DIAGNOSIS — I5032 Chronic diastolic (congestive) heart failure: Secondary | ICD-10-CM | POA: Diagnosis not present

## 2023-07-15 ENCOUNTER — Ambulatory Visit: Payer: Self-pay | Admitting: *Deleted

## 2023-07-15 NOTE — Patient Outreach (Signed)
  Care Coordination   07/15/2023 Name: Ann [name] MRN: [id number] DOB: Jul 16, 1945   Care Coordination Outreach Attempts:  An unsuccessful outreach was attempted for an appointment today.  Follow Up Plan:  Additional outreach attempts will be made to offer the patient complex care management information and services.   Encounter Outcome:  No Answer. Left HIPAA compliant VM.   Care Coordination Interventions:  No, not indicated. Staff message sent to care guide requesting outreach and rescheduling.    Demetrios Loll, RN, BSN   [facility], [facility] RN Care Manager Direct Dial: [phone]

## 2023-07-21 DIAGNOSIS — J441 Chronic obstructive pulmonary disease with (acute) exacerbation: Secondary | ICD-10-CM | POA: Diagnosis not present

## 2023-07-21 DIAGNOSIS — N1832 Chronic kidney disease, stage 3b: Secondary | ICD-10-CM | POA: Diagnosis not present

## 2023-07-21 DIAGNOSIS — L02425 Furuncle of right lower limb: Secondary | ICD-10-CM | POA: Diagnosis not present

## 2023-07-21 DIAGNOSIS — E1165 Type 2 diabetes mellitus with hyperglycemia: Secondary | ICD-10-CM | POA: Diagnosis not present

## 2023-07-21 DIAGNOSIS — I509 Heart failure, unspecified: Secondary | ICD-10-CM | POA: Diagnosis not present

## 2023-07-21 DIAGNOSIS — I4891 Unspecified atrial fibrillation: Secondary | ICD-10-CM | POA: Diagnosis not present

## 2023-07-21 DIAGNOSIS — J188 Other pneumonia, unspecified organism: Secondary | ICD-10-CM | POA: Diagnosis not present

## 2023-07-23 DIAGNOSIS — J189 Pneumonia, unspecified organism: Secondary | ICD-10-CM | POA: Diagnosis not present

## 2023-07-23 DIAGNOSIS — I5032 Chronic diastolic (congestive) heart failure: Secondary | ICD-10-CM | POA: Diagnosis not present

## 2023-07-23 DIAGNOSIS — J441 Chronic obstructive pulmonary disease with (acute) exacerbation: Secondary | ICD-10-CM | POA: Diagnosis not present

## 2023-07-23 DIAGNOSIS — E1142 Type 2 diabetes mellitus with diabetic polyneuropathy: Secondary | ICD-10-CM | POA: Diagnosis not present

## 2023-07-23 DIAGNOSIS — Z6841 Body Mass Index (BMI) 40.0 and over, adult: Secondary | ICD-10-CM | POA: Diagnosis not present

## 2023-07-23 DIAGNOSIS — J440 Chronic obstructive pulmonary disease with acute lower respiratory infection: Secondary | ICD-10-CM | POA: Diagnosis not present

## 2023-07-23 DIAGNOSIS — J9621 Acute and chronic respiratory failure with hypoxia: Secondary | ICD-10-CM | POA: Diagnosis not present

## 2023-07-23 DIAGNOSIS — I110 Hypertensive heart disease with heart failure: Secondary | ICD-10-CM | POA: Diagnosis not present

## 2023-07-23 DIAGNOSIS — I4819 Other persistent atrial fibrillation: Secondary | ICD-10-CM | POA: Diagnosis not present

## 2023-07-26 DIAGNOSIS — J9601 Acute respiratory failure with hypoxia: Secondary | ICD-10-CM | POA: Diagnosis not present

## 2023-07-26 DIAGNOSIS — I5033 Acute on chronic diastolic (congestive) heart failure: Secondary | ICD-10-CM | POA: Diagnosis not present

## 2023-07-26 DIAGNOSIS — I4891 Unspecified atrial fibrillation: Secondary | ICD-10-CM | POA: Diagnosis not present

## 2023-07-31 ENCOUNTER — Other Ambulatory Visit: Payer: Self-pay

## 2023-07-31 DIAGNOSIS — E1165 Type 2 diabetes mellitus with hyperglycemia: Secondary | ICD-10-CM

## 2023-07-31 MED ORDER — INSULIN LISPRO PROT & LISPRO (75-25 MIX) 100 UNIT/ML KWIKPEN: 50.0000 [IU] | PEN_INJECTOR | Freq: Two times a day (BID) | SUBCUTANEOUS | 0 refills | Status: DC

## 2023-08-06 ENCOUNTER — Inpatient Hospital Stay: Payer: Medicare HMO | Attending: Hematology

## 2023-08-06 DIAGNOSIS — N1832 Chronic kidney disease, stage 3b: Secondary | ICD-10-CM | POA: Insufficient documentation

## 2023-08-06 DIAGNOSIS — D509 Iron deficiency anemia, unspecified: Secondary | ICD-10-CM | POA: Insufficient documentation

## 2023-08-06 DIAGNOSIS — D500 Iron deficiency anemia secondary to blood loss (chronic): Secondary | ICD-10-CM

## 2023-08-06 DIAGNOSIS — I130 Hypertensive heart and chronic kidney disease with heart failure and stage 1 through stage 4 chronic kidney disease, or unspecified chronic kidney disease: Secondary | ICD-10-CM | POA: Insufficient documentation

## 2023-08-06 DIAGNOSIS — K649 Unspecified hemorrhoids: Secondary | ICD-10-CM | POA: Insufficient documentation

## 2023-08-06 DIAGNOSIS — E1122 Type 2 diabetes mellitus with diabetic chronic kidney disease: Secondary | ICD-10-CM | POA: Diagnosis not present

## 2023-08-06 DIAGNOSIS — Z79899 Other long term (current) drug therapy: Secondary | ICD-10-CM | POA: Diagnosis not present

## 2023-08-06 LAB — CBC WITH DIFFERENTIAL/PLATELET
Abs Immature Granulocytes: 0.12 10*3/uL — ABNORMAL HIGH (ref 0.00–0.07)
Basophils Absolute: 0.1 10*3/uL (ref 0.0–0.1)
Basophils Relative: 1 %
Eosinophils Absolute: 0.1 10*3/uL (ref 0.0–0.5)
Eosinophils Relative: 1 %
HCT: 44.3 % (ref 36.0–46.0)
Hemoglobin: 13.7 g/dL (ref 12.0–15.0)
Immature Granulocytes: 1 %
Lymphocytes Relative: 24 %
Lymphs Abs: 2.3 10*3/uL (ref 0.7–4.0)
MCH: 30.5 pg (ref 26.0–34.0)
MCHC: 30.9 g/dL (ref 30.0–36.0)
MCV: 98.7 fL (ref 80.0–100.0)
Monocytes Absolute: 0.9 10*3/uL (ref 0.1–1.0)
Monocytes Relative: 9 %
Neutro Abs: 6.4 10*3/uL (ref 1.7–7.7)
Neutrophils Relative %: 64 %
Platelets: 205 10*3/uL (ref 150–400)
RBC: 4.49 MIL/uL (ref 3.87–5.11)
RDW: 16.4 % — ABNORMAL HIGH (ref 11.5–15.5)
WBC: 9.9 10*3/uL (ref 4.0–10.5)
nRBC: 0 % (ref 0.0–0.2)

## 2023-08-06 LAB — IRON AND TIBC
Iron: 85 ug/dL (ref 28–170)
Saturation Ratios: 27 % (ref 10.4–31.8)
TIBC: 320 ug/dL (ref 250–450)
UIBC: 235 ug/dL

## 2023-08-06 LAB — FERRITIN: Ferritin: 91 ng/mL (ref 11–307)

## 2023-08-11 ENCOUNTER — Ambulatory Visit: Payer: Medicaid - Dental | Admitting: Urology

## 2023-08-13 ENCOUNTER — Inpatient Hospital Stay (HOSPITAL_BASED_OUTPATIENT_CLINIC_OR_DEPARTMENT_OTHER): Payer: Medicare HMO | Admitting: Oncology

## 2023-08-13 VITALS — BP 98/73 | HR 73 | Temp 97.7°F | Resp 198 | Ht 63.0 in

## 2023-08-13 DIAGNOSIS — D509 Iron deficiency anemia, unspecified: Secondary | ICD-10-CM | POA: Diagnosis not present

## 2023-08-13 DIAGNOSIS — D500 Iron deficiency anemia secondary to blood loss (chronic): Secondary | ICD-10-CM

## 2023-08-13 DIAGNOSIS — Z79899 Other long term (current) drug therapy: Secondary | ICD-10-CM | POA: Diagnosis not present

## 2023-08-13 DIAGNOSIS — K649 Unspecified hemorrhoids: Secondary | ICD-10-CM | POA: Diagnosis not present

## 2023-08-13 DIAGNOSIS — E1122 Type 2 diabetes mellitus with diabetic chronic kidney disease: Secondary | ICD-10-CM | POA: Diagnosis not present

## 2023-08-13 DIAGNOSIS — I130 Hypertensive heart and chronic kidney disease with heart failure and stage 1 through stage 4 chronic kidney disease, or unspecified chronic kidney disease: Secondary | ICD-10-CM | POA: Diagnosis not present

## 2023-08-13 DIAGNOSIS — N1832 Chronic kidney disease, stage 3b: Secondary | ICD-10-CM | POA: Diagnosis not present
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
## 2023-08-13 NOTE — Patient Instructions (Signed)
 Take acid reflux medications at least 6 hours from your iron.   Take Iron with Vit C supplement or orange juice to help with absorption.

## 2023-08-18 DIAGNOSIS — I509 Heart failure, unspecified: Secondary | ICD-10-CM | POA: Diagnosis not present

## 2023-08-18 DIAGNOSIS — E1165 Type 2 diabetes mellitus with hyperglycemia: Secondary | ICD-10-CM | POA: Diagnosis not present

## 2023-08-25 DIAGNOSIS — N390 Urinary tract infection, site not specified: Secondary | ICD-10-CM | POA: Diagnosis not present

## 2023-08-26 DIAGNOSIS — I5033 Acute on chronic diastolic (congestive) heart failure: Secondary | ICD-10-CM | POA: Diagnosis not present

## 2023-08-26 DIAGNOSIS — I4819 Other persistent atrial fibrillation: Secondary | ICD-10-CM | POA: Diagnosis not present

## 2023-08-26 DIAGNOSIS — E1142 Type 2 diabetes mellitus with diabetic polyneuropathy: Secondary | ICD-10-CM | POA: Diagnosis not present

## 2023-08-26 DIAGNOSIS — J9601 Acute respiratory failure with hypoxia: Secondary | ICD-10-CM | POA: Diagnosis not present

## 2023-08-26 DIAGNOSIS — J440 Chronic obstructive pulmonary disease with acute lower respiratory infection: Secondary | ICD-10-CM | POA: Diagnosis not present

## 2023-08-26 DIAGNOSIS — I5032 Chronic diastolic (congestive) heart failure: Secondary | ICD-10-CM | POA: Diagnosis not present

## 2023-08-26 DIAGNOSIS — I110 Hypertensive heart disease with heart failure: Secondary | ICD-10-CM | POA: Diagnosis not present

## 2023-08-26 DIAGNOSIS — Z6841 Body Mass Index (BMI) 40.0 and over, adult: Secondary | ICD-10-CM | POA: Diagnosis not present

## 2023-08-26 DIAGNOSIS — J9621 Acute and chronic respiratory failure with hypoxia: Secondary | ICD-10-CM | POA: Diagnosis not present

## 2023-08-26 DIAGNOSIS — J189 Pneumonia, unspecified organism: Secondary | ICD-10-CM | POA: Diagnosis not present

## 2023-08-26 DIAGNOSIS — J441 Chronic obstructive pulmonary disease with (acute) exacerbation: Secondary | ICD-10-CM | POA: Diagnosis not present

## 2023-08-26 DIAGNOSIS — I4891 Unspecified atrial fibrillation: Secondary | ICD-10-CM | POA: Diagnosis not present

## 2023-08-27 ENCOUNTER — Ambulatory Visit: Payer: Medicaid - Dental | Admitting: Urology

## 2023-09-03 ENCOUNTER — Ambulatory Visit: Payer: Medicaid - Dental | Admitting: Urology

## 2023-09-17 ENCOUNTER — Encounter (HOSPITAL_COMMUNITY): Payer: Self-pay

## 2023-09-17 ENCOUNTER — Other Ambulatory Visit: Payer: Self-pay

## 2023-09-17 ENCOUNTER — Emergency Department (HOSPITAL_COMMUNITY)

## 2023-09-17 ENCOUNTER — Emergency Department (HOSPITAL_COMMUNITY)
Admission: EM | Admit: 2023-09-17 | Discharge: 2023-09-18 | Disposition: A | Discharge status: Home / Self Care | Attending: Emergency Medicine | Admitting: Emergency Medicine

## 2023-09-17 DIAGNOSIS — I499 Cardiac arrhythmia, unspecified: Secondary | ICD-10-CM | POA: Diagnosis not present

## 2023-09-17 DIAGNOSIS — I4891 Unspecified atrial fibrillation: Secondary | ICD-10-CM | POA: Diagnosis not present

## 2023-09-17 DIAGNOSIS — I959 Hypotension, unspecified: Secondary | ICD-10-CM | POA: Diagnosis not present

## 2023-09-17 DIAGNOSIS — K529 Noninfective gastroenteritis and colitis, unspecified: Secondary | ICD-10-CM | POA: Insufficient documentation

## 2023-09-17 DIAGNOSIS — I10 Essential (primary) hypertension: Secondary | ICD-10-CM | POA: Diagnosis not present

## 2023-09-17 DIAGNOSIS — Z9049 Acquired absence of other specified parts of digestive tract: Secondary | ICD-10-CM | POA: Diagnosis not present

## 2023-09-17 DIAGNOSIS — Z743 Need for continuous supervision: Secondary | ICD-10-CM | POA: Diagnosis not present

## 2023-09-17 DIAGNOSIS — Z7901 Long term (current) use of anticoagulants: Secondary | ICD-10-CM | POA: Diagnosis not present

## 2023-09-17 DIAGNOSIS — R918 Other nonspecific abnormal finding of lung field: Secondary | ICD-10-CM | POA: Diagnosis not present

## 2023-09-17 DIAGNOSIS — R197 Diarrhea, unspecified: Secondary | ICD-10-CM | POA: Diagnosis not present

## 2023-09-17 DIAGNOSIS — R000 Tachycardia, unspecified: Secondary | ICD-10-CM | POA: Diagnosis not present

## 2023-09-17 DIAGNOSIS — R410 Disorientation, unspecified: Secondary | ICD-10-CM | POA: Diagnosis not present

## 2023-09-17 DIAGNOSIS — R0689 Other abnormalities of breathing: Secondary | ICD-10-CM | POA: Diagnosis not present

## 2023-09-17 DIAGNOSIS — R42 Dizziness and giddiness: Secondary | ICD-10-CM | POA: Diagnosis not present

## 2023-09-17 LAB — COMPREHENSIVE METABOLIC PANEL WITH GFR
ALT: 15 U/L (ref 0–44)
AST: 22 U/L (ref 15–41)
Albumin: 4 g/dL (ref 3.5–5.0)
Alkaline Phosphatase: 80 U/L (ref 38–126)
Anion gap: 14 (ref 5–15)
BUN: 17 mg/dL (ref 8–23)
CO2: 22 mmol/L (ref 22–32)
Calcium: 9.4 mg/dL (ref 8.9–10.3)
Chloride: 105 mmol/L (ref 98–111)
Creatinine, Ser: 1.11 mg/dL — ABNORMAL HIGH (ref 0.44–1.00)
GFR, Estimated: 51 mL/min — ABNORMAL LOW (ref >60)
Glucose, Bld: 179 mg/dL — ABNORMAL HIGH (ref 70–99)
Potassium: 3.8 mmol/L (ref 3.5–5.1)
Sodium: 141 mmol/L (ref 135–145)
Total Bilirubin: 1 mg/dL (ref 0.0–1.2)
Total Protein: 7.2 g/dL (ref 6.5–8.1)

## 2023-09-17 LAB — CBC WITH DIFFERENTIAL/PLATELET
Abs Immature Granulocytes: 0.06 10*3/uL (ref 0.00–0.07)
Basophils Absolute: 0.1 10*3/uL (ref 0.0–0.1)
Basophils Relative: 0 %
Eosinophils Absolute: 0.1 10*3/uL (ref 0.0–0.5)
Eosinophils Relative: 1 %
HCT: 47.7 % — ABNORMAL HIGH (ref 36.0–46.0)
Hemoglobin: 15.6 g/dL — ABNORMAL HIGH (ref 12.0–15.0)
Immature Granulocytes: 0 %
Lymphocytes Relative: 12 %
Lymphs Abs: 1.7 10*3/uL (ref 0.7–4.0)
MCH: 31.1 pg (ref 26.0–34.0)
MCHC: 32.7 g/dL (ref 30.0–36.0)
MCV: 95 fL (ref 80.0–100.0)
Monocytes Absolute: 1.1 10*3/uL — ABNORMAL HIGH (ref 0.1–1.0)
Monocytes Relative: 8 %
Neutro Abs: 11.2 10*3/uL — ABNORMAL HIGH (ref 1.7–7.7)
Neutrophils Relative %: 79 %
Platelets: 238 10*3/uL (ref 150–400)
RBC: 5.02 MIL/uL (ref 3.87–5.11)
RDW: 14.4 % (ref 11.5–15.5)
WBC: 14.2 10*3/uL — ABNORMAL HIGH (ref 4.0–10.5)
nRBC: 0 % (ref 0.0–0.2)

## 2023-09-17 MED ORDER — SODIUM CHLORIDE 0.9 % IV BOLUS
2000.0000 mL | Freq: Once | INTRAVENOUS | Status: AC
  Administered 2023-09-17: 2000 mL via INTRAVENOUS

## 2023-09-17 MED ORDER — DILTIAZEM HCL ER COATED BEADS 120 MG PO CP24: 120.0000 mg | ORAL_CAPSULE | Freq: Every day | ORAL | Status: DC

## 2023-09-17 MED ORDER — IOHEXOL 300 MG/ML  SOLN
100.0000 mL | Freq: Once | INTRAMUSCULAR | Status: AC | PRN
  Administered 2023-09-17: 100 mL via INTRAVENOUS

## 2023-09-17 MED ORDER — METRONIDAZOLE 500 MG PO TABS: 500.0000 mg | ORAL_TABLET | Freq: Four times a day (QID) | ORAL | 0 refills | Status: DC

## 2023-09-17 MED ORDER — CIPROFLOXACIN HCL 500 MG PO TABS: 500.0000 mg | ORAL_TABLET | Freq: Two times a day (BID) | ORAL | 0 refills | Status: DC

## 2023-09-17 MED ORDER — CIPROFLOXACIN IN D5W 400 MG/200ML IV SOLN
400.0000 mg | Freq: Once | INTRAVENOUS | Status: AC
  Administered 2023-09-17: 400 mg via INTRAVENOUS
  Filled 2023-09-17: qty 200

## 2023-09-17 MED ORDER — DILTIAZEM HCL 25 MG/5ML IV SOLN
10.0000 mg | Freq: Once | INTRAVENOUS | Status: AC
  Administered 2023-09-17: 10 mg via INTRAVENOUS
  Filled 2023-09-17: qty 5

## 2023-09-17 NOTE — Discharge Instructions (Signed)
 Patient sure you take your regular medicines and follow-up with your family doctor next week for recheck

## 2023-09-17 NOTE — ED Triage Notes (Signed)
 Pt bib [facility] for c/o diarrhea, dizziness, and disoriented at time for the past few days. EMS states pt is in A-fib with RVR HR 120s-170s. Pt does have a hx of a-fib and COPD. Pt is on 2L of O2 normally at home.

## 2023-09-19 NOTE — ED Provider Notes (Signed)
 [place] EMERGENCY DEPARTMENT AT [facility] Provider Note   CSN: [id number] Arrival date & time: 09/17/23  1841     History  Chief Complaint  Patient presents with   Atrial Fibrillation    Ann [name] is a 78 y.o. female.  Patient with a history of atrial fibs.  She presents with diarrhea and weakness.  No fever no chills  The history is provided by the patient and medical records.  Weakness Severity:  Mild Onset quality:  Gradual Timing:  Constant Progression:  Waxing and waning Chronicity:  New Context: not alcohol  use   Relieved by:  Nothing Worsened by:  Nothing Ineffective treatments:  None tried Associated symptoms: no abdominal pain, no chest pain, no cough, no diarrhea, no frequency, no headaches and no seizures   Risk factors: no anemia        Home Medications Prior to Admission medications   Medication Sig Start Date End Date Taking? Authorizing Provider  acetaminophen  (TYLENOL ) 325 MG tablet Take 2 tablets (650 mg total) by mouth every 6 (six) hours as needed for mild pain, moderate pain or fever. 12/11/19  Yes Emokpae, Courage, MD  albuterol  (VENTOLIN  HFA) 108 (90 Base) MCG/ACT inhaler Inhale 2 puffs into the lungs every 4 (four) hours as needed for wheezing. Patient taking differently: Inhale 2 puffs into the lungs every 4 (four) hours as needed for wheezing or shortness of breath. 05/31/20  Yes Angiulli, Everlyn Hockey, PA-C  apixaban  (ELIQUIS ) 5 MG TABS tablet Take 1 tablet (5 mg total) by mouth 2 (two) times daily. 04/16/23 04/10/24 Yes BranchJoyceann No, MD  atorvastatin  (LIPITOR ) 80 MG tablet TAKE 1 TABLET(80 MG) BY MOUTH DAILY AT 6 PM 06/16/23  Yes Branch, Joyceann No, MD  benzonatate  (TESSALON ) 100 MG capsule Take 1 capsule (100 mg total) by mouth every 8 (eight) hours. Patient taking differently: Take 100 mg by mouth 3 (three) times daily as needed for cough. 05/22/23  Yes Leath-Warren, Belen Bowers, NP  bisoprolol  (ZEBETA ) 5 MG tablet Take 1  tablet (5 mg total) by mouth daily. 07/08/23  Yes Tat, Myrtie Atkinson, MD  budesonide  (PULMICORT ) 0.25 MG/2ML nebulizer solution One vial first thing in am and with evening dose of duoneb Patient taking differently: Take 0.25 mg by nebulization in the morning and at bedtime. 03/10/23  Yes Diamond Formica, MD  Cholecalciferol  (VITAMIN D3) 125 MCG (5000 UT) CAPS Take 1 capsule (5,000 Units total) by mouth daily. 05/31/20  Yes Angiulli, Everlyn Hockey, PA-C  ciprofloxacin  (CIPRO ) 500 MG tablet Take 1 tablet (500 mg total) by mouth 2 (two) times daily. One po bid x 7 days 09/17/23  Yes Braylon Grenda, MD  cyanocobalamin  1000 MCG tablet Take 1 tablet (1,000 mcg total) by mouth daily. Patient taking differently: Take 2,500 mcg by mouth daily. 05/31/20  Yes Angiulli, Everlyn Hockey, PA-C  diltiazem  (CARDIZEM  CD) 120 MG 24 hr capsule Take 1 capsule (120 mg total) by mouth daily. 04/16/23  Yes BranchJoyceann No, MD  esomeprazole  (NEXIUM ) 40 MG capsule Take 1 capsule (40 mg total) by mouth 2 (two) times daily before a meal. 01/30/23 01/30/24 Yes Carver, Charles K, DO  furosemide  (LASIX ) 40 MG tablet Take 1 tablet (40 mg total) by mouth 2 (two) times daily. 04/16/23  Yes BranchJoyceann No, MD  gabapentin  (NEURONTIN ) 400 MG capsule Take 1 capsule (400 mg total) by mouth 3 (three) times daily. 05/31/20  Yes Angiulli, Everlyn Hockey, PA-C  hydrOXYzine (ATARAX) 10 MG tablet Take 10 mg  by mouth every 8 (eight) hours as needed for anxiety. 07/23/23  Yes [provider]  Insulin  Lispro Prot & Lispro (HUMALOG  75/25 MIX) (75-25) 100 UNIT/ML Kwikpen Inject 50-60 Units into the skin 2 (two) times daily before a meal. 60 units with breakfast, 50 units with supper 07/31/23  Yes Nida, Gebreselassie W, MD  ipratropium-albuterol  (DUONEB) 0.5-2.5 (3) MG/3ML SOLN Take 3 mLs by nebulization 4 (four) times daily. 03/10/23  Yes Diamond Formica, MD  Iron , Ferrous Sulfate , 325 (65 Fe) MG TABS Take 325 mg by mouth daily. 10/24/22  Yes Del Amber Bail, Rogerio Clay, FNP   levothyroxine  (SYNTHROID ) 200 MCG tablet Take 200 mcg by mouth daily. 07/21/23  Yes [provider]  Menthol-Methyl Salicylate  (SALONPAS  PAIN RELIEF  PATCH) PTCH Apply 1 patch topically daily as needed (pain).   Yes [provider]  metroNIDAZOLE  (FLAGYL ) 500 MG tablet Take 1 tablet (500 mg total) by mouth 4 (four) times daily. 09/17/23  Yes Cheyenne Cotta, MD  nitroGLYCERIN  (NITROSTAT ) 0.4 MG SL tablet Place 0.4 mg under the tongue every 5 (five) minutes x 3 doses as needed for chest pain (if no relief after 2nd dose, proceed to ED or call 911).   Yes [provider]  nystatin  (MYCOSTATIN /NYSTOP ) powder Apply topically 3 (three) times daily. Apply to to affected areas (groin/skin folds) 12/06/22  Yes Haydee Lipa, MD  TRELEGY ELLIPTA 100-62.5-25 MCG/ACT AEPB Inhale 1 puff into the lungs daily. 04/15/23  Yes [provider]  triamcinolone  cream (KENALOG ) 0.1 % Apply 1 Application topically 2 (two) times daily. Patient taking differently: Apply 1 Application topically 2 (two) times daily as needed (for skin rash/irritation). 11/07/22  Yes Vu, Martie Slaughter, MD  Vibegron  (GEMTESA ) 75 MG TABS Take 1 tablet (75 mg total) by mouth daily. 08/19/22  Yes McKenzie, Arden Beck, MD  Continuous Glucose Sensor (FREESTYLE LIBRE 2 SENSOR) MISC USE TO CHECK BLOOD SUGAR AS DIRECTED . CHANGE SENSOR EVERY 14 DAYS 04/14/23   [provider]      Allergies    Ace inhibitors, Asa [aspirin ], Breztri  aerosphere [budeson-glycopyrrol-formoterol ], Tape, Niacin, and Reglan  [metoclopramide ]    Review of Systems   Review of Systems  Constitutional:  Negative for appetite change and fatigue.  HENT:  Negative for congestion, ear discharge and sinus pressure.   Eyes:  Negative for discharge.  Respiratory:  Negative for cough.   Cardiovascular:  Negative for chest pain.  Gastrointestinal:  Negative for abdominal pain and diarrhea.  Genitourinary:  Negative for frequency and hematuria.   Musculoskeletal:  Negative for back pain.  Skin:  Negative for rash.  Neurological:  Positive for weakness. Negative for seizures and headaches.  Psychiatric/Behavioral:  Negative for hallucinations.     Physical Exam Updated Vital Signs BP (!) 136/94   Pulse 85   Temp (!) 97.3 F (36.3 C) (Axillary)   Resp 18   Ht 5\' 3"  (1.6 m)   Wt 113.6 kg   SpO2 95%   BMI 44.36 kg/m  Physical Exam Vitals and nursing note reviewed.  Constitutional:      Appearance: She is well-developed.  HENT:     Head: Normocephalic.     Mouth/Throat:     Comments: Dry mucous membranes Eyes:     General: No scleral icterus.    Conjunctiva/sclera: Conjunctivae normal.  Neck:     Thyroid : No thyromegaly.  Cardiovascular:     Rate and Rhythm: Normal rate and regular rhythm.     Heart sounds: No murmur heard.  No friction rub. No gallop.  Pulmonary:     Breath sounds: No stridor. No wheezing or rales.  Chest:     Chest wall: No tenderness.  Abdominal:     General: There is no distension.     Tenderness: There is no abdominal tenderness. There is no rebound.  Musculoskeletal:        General: Normal range of motion.     Cervical back: Neck supple.  Lymphadenopathy:     Cervical: No cervical adenopathy.  Skin:    Findings: No erythema or rash.  Neurological:     Mental Status: She is alert and oriented to person, place, and time.     Motor: No abnormal muscle tone.     Coordination: Coordination normal.  Psychiatric:        Behavior: Behavior normal.     ED Results / Procedures / Treatments   Labs (all labs ordered are listed, but only abnormal results are displayed) Labs Reviewed  CBC WITH DIFFERENTIAL/PLATELET - Abnormal; Notable for the following components:      Result Value   WBC 14.2 (*)    Hemoglobin 15.6 (*)    HCT 47.7 (*)    Neutro Abs 11.2 (*)    Monocytes Absolute 1.1 (*)    All other components within normal limits  COMPREHENSIVE METABOLIC PANEL WITH GFR - Abnormal;  Notable for the following components:   Glucose, Bld 179 (*)    Creatinine, Ser 1.11 (*)    GFR, Estimated 51 (*)    All other components within normal limits  GASTROINTESTINAL PANEL BY PCR, STOOL (REPLACES STOOL CULTURE)    EKG None  Radiology CT ABDOMEN PELVIS W CONTRAST Result Date: 09/17/2023 CLINICAL DATA:  Diarrhea and dizziness EXAM: CT ABDOMEN AND PELVIS WITH CONTRAST TECHNIQUE: Multidetector CT imaging of the abdomen and pelvis was performed using the standard protocol following bolus administration of intravenous contrast. RADIATION DOSE REDUCTION: This exam was performed according to the departmental dose-optimization program which includes automated exposure control, adjustment of the mA and/or kV according to patient size and/or use of iterative reconstruction technique. CONTRAST:  OMNIPAQUE  IOHEXOL  300 MG/ML  SOLN COMPARISON:  CT 12/13/2021 FINDINGS: Lower chest: Mild atelectasis or scarring at the bases. Borderline cardiomegaly Hepatobiliary: No focal liver abnormality is seen. Status post cholecystectomy. No biliary dilatation. Pancreas: Unremarkable. No pancreatic ductal dilatation or surrounding inflammatory changes. Spleen: Normal in size without focal abnormality. Adrenals/Urinary Tract: Adrenal glands are within normal limits. Kidneys show no hydronephrosis. Slightly thick-walled urinary bladder with mild perivesical stranding Stomach/Bowel: Stomach nonenlarged. No dilated small bowel. No acute bowel wall thickening. Colon anastomosis in the pelvis. Vascular/Lymphatic: Aortic atherosclerosis. No enlarged abdominal or pelvic lymph nodes. Reproductive: Hysterectomy.  No adnexal mass Other: Negative for pelvic effusion or free air. Left abdominal wall hernia containing fat and small bowel but no evidence for obstruction or incarceration. Benign-appearing fat density circumscribed lesion in the right anterior abdominal wall. Musculoskeletal: No acute osseous abnormality.   Degenerative changes IMPRESSION: 1. Slightly thick-walled urinary bladder with mild perivesical stranding, question cystitis. 2. Left abdominal wall hernia containing fat and small bowel but no evidence for obstruction or incarceration. 3. Aortic atherosclerosis. Aortic Atherosclerosis (ICD10-I70.0). Electronically Signed   By: Esmeralda Hedge M.D.   On: 09/17/2023 22:21   DG Chest Port 1 View Result Date: 09/17/2023 CLINICAL DATA:  Diarrhea, dizziness and disorientation. EXAM: PORTABLE CHEST 1 VIEW COMPARISON:  June 28, 2023 FINDINGS: The cardiac silhouette is enlarged and unchanged in size. There  is marked severity calcification of the aortic arch. Mild linear scarring and/or atelectasis is seen within the mid left lung. There is no evidence of acute infiltrate, pleural effusion or pneumothorax. Postoperative changes are seen within the lower cervical spine. The visualized skeletal structures are otherwise unremarkable. IMPRESSION: Stable cardiomegaly with mild mid left lung linear scarring and/or atelectasis. Electronically Signed   By: Virgle Grime M.D.   On: 09/17/2023 19:54    Procedures Procedures    Medications Ordered in ED Medications  sodium chloride  0.9 % bolus 2,000 mL (0 mLs Intravenous Stopped 09/17/23 2309)  iohexol  (OMNIPAQUE ) 300 MG/ML solution 100 mL (100 mLs Intravenous Contrast Given 09/17/23 2107)  diltiazem  (CARDIZEM ) injection 10 mg (10 mg Intravenous Given 09/17/23 2316)  ciprofloxacin  (CIPRO ) IVPB 400 mg (0 mg Intravenous Stopped 09/18/23 0019)    ED Course/ Medical Decision Making/ A&P  White count mildly elevated at 14.  Patient nontoxic.  CT scan of the abdomen shows possible cystitis along with an abdominal wall hernia containing fat no incarceration.                               Medical Decision Making Amount and/or Complexity of Data Reviewed Labs: ordered. Radiology: ordered.  Risk Prescription drug management.   Patient with diarrhea and dehydration  and possible cystitis.  Patient has been hydrated with normal saline and feels better.  She will be discharged home with Cipro  and Flagyl  and will follow-up with PCP        Final Clinical Impression(s) / ED Diagnoses Final diagnoses:  Colitis    Rx / DC Orders ED Discharge Orders          Ordered    ciprofloxacin  (CIPRO ) 500 MG tablet  2 times daily        09/17/23 2330    metroNIDAZOLE  (FLAGYL ) 500 MG tablet  4 times daily        09/17/23 2330              Cheyenne Cotta, MD 09/19/23 1217

## 2023-09-22 DIAGNOSIS — A084 Viral intestinal infection, unspecified: Secondary | ICD-10-CM | POA: Diagnosis not present

## 2023-09-22 DIAGNOSIS — R197 Diarrhea, unspecified: Secondary | ICD-10-CM | POA: Diagnosis not present

## 2023-09-22 DIAGNOSIS — J449 Chronic obstructive pulmonary disease, unspecified: Secondary | ICD-10-CM | POA: Diagnosis not present

## 2023-09-22 DIAGNOSIS — E1165 Type 2 diabetes mellitus with hyperglycemia: Secondary | ICD-10-CM | POA: Diagnosis not present

## 2023-09-22 DIAGNOSIS — R110 Nausea: Secondary | ICD-10-CM | POA: Diagnosis not present

## 2023-09-23 DIAGNOSIS — L603 Nail dystrophy: Secondary | ICD-10-CM | POA: Diagnosis not present

## 2023-09-25 DIAGNOSIS — I4891 Unspecified atrial fibrillation: Secondary | ICD-10-CM | POA: Diagnosis not present

## 2023-09-25 DIAGNOSIS — J9601 Acute respiratory failure with hypoxia: Secondary | ICD-10-CM | POA: Diagnosis not present

## 2023-09-25 DIAGNOSIS — I5033 Acute on chronic diastolic (congestive) heart failure: Secondary | ICD-10-CM | POA: Diagnosis not present

## 2023-09-30 ENCOUNTER — Encounter: Payer: Self-pay | Admitting: Internal Medicine

## 2023-10-07 DIAGNOSIS — I509 Heart failure, unspecified: Secondary | ICD-10-CM | POA: Diagnosis not present

## 2023-10-07 DIAGNOSIS — E785 Hyperlipidemia, unspecified: Secondary | ICD-10-CM | POA: Diagnosis not present

## 2023-10-07 DIAGNOSIS — E1165 Type 2 diabetes mellitus with hyperglycemia: Secondary | ICD-10-CM | POA: Diagnosis not present

## 2023-10-07 DIAGNOSIS — N1832 Chronic kidney disease, stage 3b: Secondary | ICD-10-CM | POA: Diagnosis not present

## 2023-10-07 DIAGNOSIS — Z9981 Dependence on supplemental oxygen: Secondary | ICD-10-CM | POA: Diagnosis not present

## 2023-10-07 DIAGNOSIS — J449 Chronic obstructive pulmonary disease, unspecified: Secondary | ICD-10-CM | POA: Diagnosis not present

## 2023-10-09 DIAGNOSIS — E1165 Type 2 diabetes mellitus with hyperglycemia: Secondary | ICD-10-CM | POA: Diagnosis not present

## 2023-10-09 DIAGNOSIS — N1832 Chronic kidney disease, stage 3b: Secondary | ICD-10-CM | POA: Diagnosis not present

## 2023-10-17 DIAGNOSIS — E1165 Type 2 diabetes mellitus with hyperglycemia: Secondary | ICD-10-CM | POA: Diagnosis not present

## 2023-10-17 LAB — LAB REPORT - SCANNED
A1c: 8.2
EGFR (Non-African Amer.): 78

## 2023-10-18 LAB — COMPREHENSIVE METABOLIC PANEL WITH GFR
ALT: 17 IU/L (ref 0–32)
AST: 28 IU/L (ref 0–40)
Albumin: 3.8 g/dL (ref 3.8–4.8)
Alkaline Phosphatase: 95 IU/L (ref 44–121)
BUN/Creatinine Ratio: 25 (ref 12–28)
BUN: 22 mg/dL (ref 8–27)
Bilirubin Total: 0.3 mg/dL (ref 0.0–1.2)
CO2: 22 mmol/L (ref 20–29)
Calcium: 9.4 mg/dL (ref 8.7–10.3)
Chloride: 100 mmol/L (ref 96–106)
Creatinine, Ser: 0.87 mg/dL (ref 0.57–1.00)
Globulin, Total: 2.5 g/dL (ref 1.5–4.5)
Glucose: 151 mg/dL — ABNORMAL HIGH (ref 70–99)
Potassium: 5 mmol/L (ref 3.5–5.2)
Sodium: 141 mmol/L (ref 134–144)
Total Protein: 6.3 g/dL (ref 6.0–8.5)
eGFR: 68 mL/min/{1.73_m2} (ref >59)

## 2023-10-18 LAB — T4, FREE: Free T4: 0.61 ng/dL — ABNORMAL LOW (ref 0.82–1.77)

## 2023-10-21 ENCOUNTER — Ambulatory Visit: Payer: Medicare HMO | Attending: Cardiology | Admitting: Cardiology

## 2023-10-21 ENCOUNTER — Encounter: Payer: Self-pay | Admitting: Cardiology

## 2023-10-21 VITALS — BP 130/72 | HR 62 | Ht 63.0 in | Wt 275.8 lb

## 2023-10-21 DIAGNOSIS — D6869 Other thrombophilia: Secondary | ICD-10-CM | POA: Diagnosis not present

## 2023-10-21 DIAGNOSIS — I2510 Atherosclerotic heart disease of native coronary artery without angina pectoris: Secondary | ICD-10-CM | POA: Diagnosis not present

## 2023-10-21 DIAGNOSIS — I10 Essential (primary) hypertension: Secondary | ICD-10-CM

## 2023-10-21 DIAGNOSIS — E782 Mixed hyperlipidemia: Secondary | ICD-10-CM | POA: Diagnosis not present

## 2023-10-21 DIAGNOSIS — I480 Paroxysmal atrial fibrillation: Secondary | ICD-10-CM

## 2023-10-21 MED ORDER — NITROGLYCERIN 0.4 MG SL SUBL: 0.4000 mg | SUBLINGUAL_TABLET | SUBLINGUAL | 3 refills | Status: AC | PRN

## 2023-10-21 NOTE — Patient Instructions (Signed)
Medication Instructions:  Nitroglycerin refilled today Continue all other medications.     Labwork: none  Testing/Procedures: none  Follow-Up: 6 months   Any Other Special Instructions Will Be Listed Below (If Applicable).   If you need a refill on your cardiac medications before your next appointment, please call your pharmacy.  
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
## 2023-10-26 DIAGNOSIS — I5033 Acute on chronic diastolic (congestive) heart failure: Secondary | ICD-10-CM | POA: Diagnosis not present

## 2023-10-26 DIAGNOSIS — J9601 Acute respiratory failure with hypoxia: Secondary | ICD-10-CM | POA: Diagnosis not present

## 2023-10-26 DIAGNOSIS — I4891 Unspecified atrial fibrillation: Secondary | ICD-10-CM | POA: Diagnosis not present

## 2023-10-27 ENCOUNTER — Encounter: Payer: Self-pay | Admitting: "Endocrinology

## 2023-10-27 ENCOUNTER — Ambulatory Visit (INDEPENDENT_AMBULATORY_CARE_PROVIDER_SITE_OTHER): Payer: Medicare HMO | Admitting: "Endocrinology

## 2023-10-27 VITALS — BP 108/76 | HR 68 | Ht 63.0 in | Wt 279.4 lb

## 2023-10-27 DIAGNOSIS — E1165 Type 2 diabetes mellitus with hyperglycemia: Secondary | ICD-10-CM

## 2023-10-27 DIAGNOSIS — I10 Essential (primary) hypertension: Secondary | ICD-10-CM

## 2023-10-27 DIAGNOSIS — E2740 Unspecified adrenocortical insufficiency: Secondary | ICD-10-CM | POA: Diagnosis not present

## 2023-10-27 DIAGNOSIS — E782 Mixed hyperlipidemia: Secondary | ICD-10-CM | POA: Diagnosis not present

## 2023-10-27 DIAGNOSIS — Z794 Long term (current) use of insulin: Secondary | ICD-10-CM

## 2023-10-27 DIAGNOSIS — E039 Hypothyroidism, unspecified: Secondary | ICD-10-CM | POA: Diagnosis not present

## 2023-10-27 MED ORDER — LEVOTHYROXINE SODIUM 200 MCG PO TABS: 200.0000 ug | ORAL_TABLET | Freq: Every day | ORAL | 1 refills | Status: AC

## 2023-10-27 MED ORDER — INSULIN LISPRO PROT & LISPRO (75-25 MIX) 100 UNIT/ML KWIKPEN: 60.0000 [IU] | PEN_INJECTOR | Freq: Two times a day (BID) | SUBCUTANEOUS | 0 refills | Status: AC

## 2023-10-27 MED ORDER — GLIPIZIDE ER 5 MG PO TB24: 5.0000 mg | ORAL_TABLET | Freq: Every day | ORAL | 1 refills | Status: DC

## 2023-10-27 MED ORDER — ATORVASTATIN CALCIUM 80 MG PO TABS: 80.0000 mg | ORAL_TABLET | Freq: Every day | ORAL | 1 refills | Status: AC

## 2023-10-27 MED ORDER — PREDNISONE 10 MG PO TABS: 10.0000 mg | ORAL_TABLET | Freq: Every day | ORAL | 1 refills | Status: AC

## 2023-10-27 NOTE — Patient Instructions (Signed)
 Advice for Weight Management  -For most of [place] the best way to lose weight is by diet management. Generally speaking, diet management means consuming less calories intentionally which over time brings about progressive weight loss.  This can be achieved more effectively by avoiding ultra processed carbohydrates, processed meats, unhealthy fats.    It is critically important to know your numbers: how much calorie you are consuming and how much calorie you need. More importantly, our carbohydrates sources should be unprocessed naturally occurring  complex starch food items.  It is always important to balance nutrition also by  appropriate intake of proteins (mainly plant-based), healthy fats/oils, plenty of fruits and vegetables.   -The American College of Lifestyle Medicine (ACL M) recommends nutrition derived mostly from Whole Food, Plant Predominant Sources example an apple instead of applesauce or apple pie. Eat Plenty of vegetables, Mushrooms, fruits, Legumes, Whole Grains, Nuts, seeds in lieu of processed meats, processed snacks/pastries red meat, poultry, eggs.  Use only water or unsweetened tea for hydration.  The College also recommends the need to stay away from risky substances including alcohol, smoking; obtaining 7-9 hours of restorative sleep, at least 150 minutes of moderate intensity exercise weekly, importance of healthy social connections, and being mindful of stress and seek help when it is overwhelming.    -Sticking to a routine mealtime to eat 3 meals a day and avoiding unnecessary snacks is shown to have a big role in weight control. Under normal circumstances, the only time we burn stored energy is when we are hungry, so allow  some hunger to take place- hunger means no food between appropriate meal times, only water.  It is not advisable to starve.   -It is better to avoid simple carbohydrates including:  Cakes, Sweet Desserts, Ice Cream, Soda (diet and regular), Sweet Tea, Candies, Chips, Cookies, Store Bought Juices, Alcohol in Excess of  1-2 drinks a day, Lemonade,  Artificial Sweeteners, Doughnuts, Coffee Creamers, "Sugar-free" Products, etc, etc.  This is not a complete list...Marland Kitchen.    -Consulting with certified diabetes educators is proven to provide you with the most accurate and current information on diet.  Also, you may be  interested in discussing diet options/exchanges , we can schedule a visit with Norm Salt, RDN, CDE for individualized nutrition education.  -Exercise: If you are able: 30 -60 minutes a day ,4 days a week, or 150 minutes of moderate intensity exercise weekly.    The longer the better if tolerated.  Combine stretch, strength, and aerobic activities.  If you were told in the past that you have high risk for cardiovascular diseases, or if you are currently symptomatic, you may seek evaluation by your heart doctor prior to initiating moderate to intense exercise programs.                                  Additional Care Considerations for Diabetes/Prediabetes   -Diabetes  is a chronic disease.  The most important care consideration is regular follow-up with your diabetes care provider with the goal being avoiding or delaying its complications and to take advantage of advances in medications and technology.  If appropriate actions are taken early enough, type 2 diabetes can even be  reversed.  Seek information from the right source.  - Whole Food, Plant Predominant Nutrition is highly recommended: Eat Plenty of vegetables, Mushrooms, fruits, Legumes, Whole Grains, Nuts, seeds in lieu of processed meats, processed snacks/pastries red meat, poultry, eggs as recommended by [employer] of  Lifestyle Medicine (ACLM).  -Type 2 diabetes is known to coexist with other important comorbidities such as high blood pressure and high cholesterol.  It is critical to control not only the  diabetes but also the high blood pressure and high cholesterol to minimize and delay the risk of complications including coronary artery disease, stroke, amputations, blindness, etc.  The good news is that this diet recommendation for type 2 diabetes is also very helpful for managing high cholesterol and high blood blood pressure.  - Studies showed that people with diabetes will benefit from a class of medications known as ACE inhibitors and statins.  Unless there are specific reasons not to be on these medications, the standard of care is to consider getting one from these groups of medications at an optimal doses.  These medications are generally considered safe and proven to help protect the heart and the kidneys.    - People with diabetes are encouraged to initiate and maintain regular follow-up with eye doctors, foot doctors, dentists , and if necessary heart and kidney doctors.     - It is highly recommended that people with diabetes quit smoking or stay away from smoking, and get yearly  flu vaccine and pneumonia vaccine at least every 5 years.  See above for additional recommendations on exercise, sleep, stress management , and healthy social connections.
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
## 2023-10-28 ENCOUNTER — Encounter: Payer: Self-pay | Admitting: Hematology

## 2023-10-29 ENCOUNTER — Encounter: Payer: Self-pay | Admitting: Internal Medicine

## 2023-10-29 ENCOUNTER — Ambulatory Visit (INDEPENDENT_AMBULATORY_CARE_PROVIDER_SITE_OTHER): Admitting: Internal Medicine

## 2023-10-29 VITALS — BP 110/68 | HR 80 | Ht 63.0 in | Wt 279.6 lb

## 2023-10-29 DIAGNOSIS — Z87891 Personal history of nicotine dependence: Secondary | ICD-10-CM

## 2023-10-29 DIAGNOSIS — J449 Chronic obstructive pulmonary disease, unspecified: Secondary | ICD-10-CM

## 2023-10-29 DIAGNOSIS — J9611 Chronic respiratory failure with hypoxia: Secondary | ICD-10-CM

## 2023-10-29 MED ORDER — AZITHROMYCIN 250 MG PO TABS: ORAL_TABLET | ORAL | 0 refills | Status: DC

## 2023-10-29 NOTE — Assessment & Plan Note (Signed)
 Quit smoking 1980 then gained over a hundred pounds - PFT's  12/04/16  FEV1 1.91 (85 % ) ratio 0.79  p 13 % improvement from saba p 0 prior to study with DLCO  12.14 (50%)   and FV curve min concave  and ERV 32% at wt 174    - 04/29/2022  After extensive coaching inhaler device,  effectiveness =    75% hfa  try change trelegy to Breztri  and approp saba  - 03/10/2023  After extensive coaching inhaler device,  effectiveness =    0% so changed to duoneb qid with bud 0.25 bid  - 10/29/2023 pt on trelegy and duoneb/bud as maint so rec try off trelegy for a week then duoneb and if not difference just use the duoneb/ bud as maint or trlegy but not both.

## 2023-10-29 NOTE — Patient Instructions (Signed)
 Since you don't have copd you probably don't need both the trelegy and the duoneb so rec  1) try off trelegy x 1 week   2) if worse off trelegy, restart it and then change duoneb to up to every 6 hours with budesonide  as needed for flares  3) Zpak   Please schedule a follow up visit in 6 months but call sooner if needed

## 2023-10-29 NOTE — Assessment & Plan Note (Signed)
 As of 04/29/2022 using 02 2lpm and prn  - 05/23/22  ONO on "LPM" desats only in first hour so no change other than to wear it properly q HS  -  02 on arrival 03/10/2023  = 85% RA  - ONO on 2lpm 03/10/2023 >>> ok thru most of the night though low to start  - HCO03  10/17/23  22   Presently just using 02 2lpm hs and prn daytime
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
## 2023-10-29 NOTE — Assessment & Plan Note (Signed)
 Body mass index is 49.53 kg/m.  -  trending down slightly/ re-inforced Lab Results  Component Value Date   TSH 5.178 (H) 10/17/2022      Contributing to doe and risk of GERD >>>   reviewed the need and the process to achieve and maintain neg calorie balance > defer f/u primary care including intermittently monitoring thyroid  status            Each maintenance medication was reviewed in detail including emphasizing most importantly the difference between maintenance and prns and under what circumstances the prns are to be triggered using an action plan format where appropriate.  Total time for H and P, chart review, counseling, reviewing hfa/ dpi/neb/ 02/ pulse ox  device(s) and generating customized AVS unique to this office visit / same day charting = 35 min
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
## 2023-11-03 ENCOUNTER — Telehealth: Payer: Self-pay | Admitting: *Deleted

## 2023-11-03 ENCOUNTER — Encounter: Payer: Self-pay | Admitting: Gastroenterology

## 2023-11-03 ENCOUNTER — Ambulatory Visit (INDEPENDENT_AMBULATORY_CARE_PROVIDER_SITE_OTHER): Admitting: Gastroenterology

## 2023-11-03 VITALS — BP 105/70 | HR 85 | Temp 97.6°F | Ht 63.0 in | Wt 241.4 lb

## 2023-11-03 DIAGNOSIS — Z8719 Personal history of other diseases of the digestive system: Secondary | ICD-10-CM | POA: Diagnosis not present

## 2023-11-03 DIAGNOSIS — R197 Diarrhea, unspecified: Secondary | ICD-10-CM

## 2023-11-03 DIAGNOSIS — D509 Iron deficiency anemia, unspecified: Secondary | ICD-10-CM

## 2023-11-03 DIAGNOSIS — K649 Unspecified hemorrhoids: Secondary | ICD-10-CM

## 2023-11-03 DIAGNOSIS — R195 Other fecal abnormalities: Secondary | ICD-10-CM | POA: Diagnosis not present

## 2023-11-03 DIAGNOSIS — K219 Gastro-esophageal reflux disease without esophagitis: Secondary | ICD-10-CM

## 2023-11-03 DIAGNOSIS — K921 Melena: Secondary | ICD-10-CM | POA: Diagnosis not present

## 2023-11-03 DIAGNOSIS — R1013 Epigastric pain: Secondary | ICD-10-CM

## 2023-11-03 DIAGNOSIS — R630 Anorexia: Secondary | ICD-10-CM | POA: Diagnosis not present

## 2023-11-03 DIAGNOSIS — Z860101 Personal history of adenomatous and serrated colon polyps: Secondary | ICD-10-CM

## 2023-11-03 DIAGNOSIS — R110 Nausea: Secondary | ICD-10-CM | POA: Diagnosis not present

## 2023-11-03 DIAGNOSIS — D369 Benign neoplasm, unspecified site: Secondary | ICD-10-CM

## 2023-11-03 MED ORDER — HYDROCORTISONE (PERIANAL) 2.5 % EX CREA: 1.0000 | TOPICAL_CREAM | Freq: Two times a day (BID) | CUTANEOUS | 1 refills | Status: AC

## 2023-11-03 NOTE — Telephone Encounter (Signed)
  Request for patient to stop medication prior to procedure or is needing cleareance  11/03/23  Ann [name] 07/22/1945  What type of surgery is being performed? Colonoscopy/ Esophagogastroduodenoscopy (EGD)  When is surgery scheduled? TBD  What type of clearance is required (medical or pharmacy to hold medication or both? BOTH  Patient is needing to have medical clearance and clearance to hold Eliquis  x 2 days.  Name of physician performing surgery?  Dr.Carver [facility] [employer]: [phone] Fax: [phone]  Anethesia type (none, local, MAC, general)? [facility]

## 2023-11-03 NOTE — Patient Instructions (Signed)
 We will get you scheduled for an upper endoscopy and colonoscopy in the near future with Dr. Mordechai April after we receive clearance from cardiology.  Please continue taking your Nexium  twice daily.  For upper abdominal discomfort you may take Mylanta as needed to help soothe your upper abdomen.  Continue Zofran  as needed for nausea.  Continue taking your iron  daily.  This can cause some darker stools but not usually any overt black/tarry stools.  Please have blood work and stool studies completed at [facility].  We will call you with results once they have been received. Please allow 3-5 business days for review. 2 locations for [facility] in [place]:              1. [address], [place]              2. [address] [place], [place]   Please avoid all NSAIDs -Advil, Aleve , ibuprofen.  Please use only Tylenol  for your headaches.

## 2023-11-04 ENCOUNTER — Ambulatory Visit: Payer: Self-pay | Admitting: Gastroenterology

## 2023-11-04 LAB — COMPREHENSIVE METABOLIC PANEL WITH GFR
ALT: 11 IU/L (ref 0–32)
AST: 19 IU/L (ref 0–40)
Albumin: 4 g/dL (ref 3.8–4.8)
Alkaline Phosphatase: 77 IU/L (ref 44–121)
BUN/Creatinine Ratio: 16 (ref 12–28)
BUN: 23 mg/dL (ref 8–27)
Bilirubin Total: 0.4 mg/dL (ref 0.0–1.2)
CO2: 22 mmol/L (ref 20–29)
Calcium: 9.1 mg/dL (ref 8.7–10.3)
Chloride: 98 mmol/L (ref 96–106)
Creatinine, Ser: 1.42 mg/dL — ABNORMAL HIGH (ref 0.57–1.00)
Globulin, Total: 2.4 g/dL (ref 1.5–4.5)
Glucose: 210 mg/dL — ABNORMAL HIGH (ref 70–99)
Potassium: 5 mmol/L (ref 3.5–5.2)
Sodium: 139 mmol/L (ref 134–144)
Total Protein: 6.4 g/dL (ref 6.0–8.5)
eGFR: 38 mL/min/{1.73_m2} — ABNORMAL LOW (ref >59)

## 2023-11-04 LAB — IRON,TIBC AND FERRITIN PANEL
Ferritin: 115 ng/mL (ref 15–150)
Iron Saturation: 45 % (ref 15–55)
Iron: 150 ug/dL — ABNORMAL HIGH (ref 27–139)
Total Iron Binding Capacity: 335 ug/dL (ref 250–450)
UIBC: 185 ug/dL (ref 118–369)

## 2023-11-04 LAB — CBC
Hematocrit: 41.1 % (ref 34.0–46.6)
Hemoglobin: 13.4 g/dL (ref 11.1–15.9)
MCH: 31.8 pg (ref 26.6–33.0)
MCHC: 32.6 g/dL (ref 31.5–35.7)
MCV: 98 fL — ABNORMAL HIGH (ref 79–97)
Platelets: 213 10*3/uL (ref 150–450)
RBC: 4.21 x10E6/uL (ref 3.77–5.28)
RDW: 12.3 % (ref 11.7–15.4)
WBC: 10.7 10*3/uL (ref 3.4–10.8)

## 2023-11-04 LAB — B12 AND FOLATE PANEL
Folate: >20 ng/mL (ref >3.0)
Vitamin B-12: 681 pg/mL (ref 232–1245)

## 2023-11-05 NOTE — Telephone Encounter (Signed)
 Patient with diagnosis of A Fib on Eliquis  for anticoagulation.    Procedure: colonoscopy Date of procedure: TBD   CHA2DS2-VASc Score = 7  This indicates a 11.2% annual risk of stroke. The patient's score is based upon: CHF History: 1 HTN History: 1 Diabetes History: 1 Stroke History: 0 Vascular Disease History: 1 Age Score: 2 Gender Score: 1   CrCl 66 ml/min Platelet count 213K  Per office protocol, patient can hold Eliquis  for 2 days prior to procedure.    **This guidance is not considered finalized until pre-operative APP has relayed final recommendations.**

## 2023-11-05 NOTE — Telephone Encounter (Signed)
   Name: Ann [name]  DOB: June 12, 1945  MRN: [id number]   Primary Cardiologist: Armida Lander, MD  Chart reviewed as part of pre-operative protocol coverage.   Per Dr. Amanda Jungling, [facility] to proceed with endoscopy, ok to hold eliquis  as needed   Per office protocol, patient can hold Eliquis  for 2 days prior to procedure.      I will route this recommendation to the requesting party via Epic fax function and remove from pre-op pool. Please call with questions.  Warren Haber Ola Raap, PA 11/05/2023, 3:03 PM

## 2023-11-06 ENCOUNTER — Other Ambulatory Visit: Payer: Self-pay | Admitting: *Deleted

## 2023-11-06 ENCOUNTER — Encounter: Payer: Self-pay | Admitting: *Deleted

## 2023-11-06 DIAGNOSIS — R197 Diarrhea, unspecified: Secondary | ICD-10-CM | POA: Diagnosis not present

## 2023-11-06 MED ORDER — PEG 3350-KCL-NA BICARB-NACL 420 G PO SOLR: 4000.0000 mL | Freq: Once | ORAL | 0 refills | Status: AC

## 2023-11-06 NOTE — Telephone Encounter (Signed)
 Pt has been scheduled for 12/08/23. Instructions mailed and prep sent to the pharmacy

## 2023-11-06 NOTE — Telephone Encounter (Signed)
[facility]  TCS/EGD w/Dr.Carver, asa 3

## 2023-11-07 DIAGNOSIS — I509 Heart failure, unspecified: Secondary | ICD-10-CM | POA: Diagnosis not present

## 2023-11-07 DIAGNOSIS — E1165 Type 2 diabetes mellitus with hyperglycemia: Secondary | ICD-10-CM | POA: Diagnosis not present

## 2023-11-08 LAB — CLOSTRIDIUM DIFFICILE BY PCR: Toxigenic C. Difficile by PCR: NEGATIVE

## 2023-11-08 LAB — PANCREATIC ELASTASE, FECAL: Pancreatic Elastase, Fecal: >800 ug Elast./g (ref >200)
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
## 2023-11-14 ENCOUNTER — Encounter: Payer: Self-pay | Admitting: Urology

## 2023-11-14 ENCOUNTER — Ambulatory Visit: Admitting: Urology

## 2023-11-14 VITALS — BP 122/84 | HR 87

## 2023-11-14 DIAGNOSIS — Z87440 Personal history of urinary (tract) infections: Secondary | ICD-10-CM | POA: Diagnosis not present

## 2023-11-14 DIAGNOSIS — N200 Calculus of kidney: Secondary | ICD-10-CM | POA: Diagnosis not present

## 2023-11-14 DIAGNOSIS — N3281 Overactive bladder: Secondary | ICD-10-CM

## 2023-11-14 DIAGNOSIS — N390 Urinary tract infection, site not specified: Secondary | ICD-10-CM

## 2023-11-14 LAB — URINALYSIS, ROUTINE W REFLEX MICROSCOPIC
Bilirubin, UA: NEGATIVE
Glucose, UA: NEGATIVE
Ketones, UA: NEGATIVE
Nitrite, UA: NEGATIVE
Protein,UA: NEGATIVE
RBC, UA: NEGATIVE
Specific Gravity, UA: 1.01 (ref 1.005–1.030)
Urobilinogen, Ur: 0.2 mg/dL (ref 0.2–1.0)
pH, UA: 7 (ref 5.0–7.5)

## 2023-11-14 LAB — MICROSCOPIC EXAMINATION

## 2023-11-14 LAB — BLADDER SCAN AMB NON-IMAGING: Scan Result: 10

## 2023-11-14 MED ORDER — GEMTESA 75 MG PO TABS: 75.0000 mg | ORAL_TABLET | Freq: Every day | ORAL | 3 refills | Status: AC

## 2023-11-14 MED ORDER — ESTRADIOL 0.1 MG/GM VA CREA
TOPICAL_CREAM | VAGINAL | 3 refills | Status: AC
Start: 2023-11-14 — End: ?

## 2023-11-14 NOTE — Patient Instructions (Signed)
 UTI prevention / management:  UTI symptoms may include:  - Pain / burning / discomfort when urinating - Recent increase in urinary urgency (how quickly you feel like you need to rush to the bathroom) - Recent increase in urinary frequency (how often you are urinating) - Fever - Acute mental status change / confusion - Fatigue / Feeling tired - Weakness - Note: Urine color, clarity, and odor are not considered to be clinically significant indicators of UTI and do not warrant urine testing unless patient is also experiencing UTI symptoms such as those listed above.  Difference between Urinalysis (urine dipstick test) and Urine culture / Why urine culture often needed to determine appropriate diagnosis and treatment of urologic symptoms: > Urinalysis (urine dipstick test): A quick office test used as an indicator to determine whether or not further testing is necessary (such as a urine culture, urine microscopy, etc.) The urinalysis cannot differentiate a true bacterial UTI or give a definitive diagnosis for the findings.  > Urine culture: May be performed based on the findings of a urinalysis to evaluate for UTI. Grows out on a petri dish for 48-72 hours. Provides important information about: whether or not bacterial growth is present and if so: what the predominant bacteria is which antibiotics will work best against that bacteria That information is important so that we can diagnose and treat patients appropriately as there are other conditions which may mimic UTls which must not be missed (such as cancer, interstitial cystitis, stones, etc.). Assists us  with antibiotic stewardship to minimize patient's risk for developing antibiotic resistance (getting to a point where no antibiotics work anymore).  Options when UTI symptoms occur: 1. Call [facility] [place] and request to speak with triage nurse (phone # [phone], select option 3). In accordance with clinic guidelines  the nurse will determine next steps based on patient-reported symptoms, which may include: same-day lab visit to provide urine specimen, recommendation to schedule Urology office visit appointment for further evaluation, recommendation to proceed to ER, etc. 2. Call your Primary Care Provider (PCP) office to request urgent / same-day visit. Be sure to request for urine culture to be ordered and have results faxed to Urology (fax # [phone]).  3. Go to urgent care. Be sure to request for urine culture to be ordered and have results faxed to Urology (fax # [phone]).   For bladder pain/ burning with urination: - Can take over-the-counter Pyridium (phenazopyridine; commonly known under the AZO brand) for a few days as needed. Limit use to no more than 3 days consecutively due to risk for methemoglobinemia, liver function issues, and bone health damage with long term use of Pyridium. - Alternative: Prescription urinary analgesics (such as Uribel, Urogesic blue, Urelle, Uro-MP). Often expensive / poorly covered by insurance unfortunately.  Options / recommendations for UTI prevention: - Topical vaginal estrogen for vaginal atrophy (aka Genitourinary Syndrome of Menopause (GSM)). - Adequate daily fluid intake to flush out the urinary tract. - Go to the bathroom to urinate every 4-6 hours while awake to minimize urinary stasis / bacterial overgrowth in the bladder. - Proanthocyanidin (PAC) supplement 36 mg daily; must be soluble (insoluble form of PAC will be ineffective). Recommended brand: Ellura. This is an over-the-counter supplement (often must be found/ purchased online) supplement derived from cranberries with concentrated active component: Proanthocyanidin (PAC) 36 mg daily. Decreases bacterial adherence to bladder lining.  - D-mannose powder (2 grams daily). This is an over-the-counter supplement which decreases bacterial adherence to bladder lining (it  is a sugar that inhibits bacterial  adherence to urothelial cells by binding to the pili of enteric bacteria). Take as per manufacturer recommendation. Can be used as an alternative or in addition to the concentrated cranberry supplement.  - Vitamin C supplement to acidify urine to minimize bacterial growth.  - Probiotic to maintain healthy vaginal microbiome to suppress bacteria at urethral opening. Brand recommendations: Estill Hemming (includes probiotic & D-mannose ), Feminine Balance (highest concentration of lactobacillus) or Hyperbiotic Pro 15.  Note for patients with diabetes:  - Be aware that D-mannose contains sugar.  Note for patients with interstitial cystitis (IC):  - Patients with IC should typically avoid cranberry/ PAC supplements and Vitamin C supplements due to their acidity, which may exacerbate IC-related bladder pain. - Symptoms of true bacterial UTI can overlap / mimic symptoms of an IC flare up. Antibiotic use is NOT indicated for IC flare ups. Urine culture needed prior to antibiotic treatment for IC patients. The goal is to minimize your risk for developing antibiotic-resistant bacteria.    Vaginal atrophy I Genitourinary syndrome of menopause (GSM):  What it is: Changes in the vaginal environment (including the vulva and urethra) including: Thinning of the epithelium (skin/ mucosa surface) Can contribute to urinary urgency and frequency Can contribute to dryness, itching, irritation of the vulvar and vaginal tissue Can contribute to pain with intercourse Can contribute to physical changes of the labia, vulva, and vagina such as: Narrowing of the vaginal opening Decreased vaginal length Loss of labial architecture Labial adhesions Pale color of vulvovaginal tissue Loss of pubic hair Allows bacteria to become adherent Results in increased risk for urinary tract infection (UTI) due to bacterial overgrowth and migration up the urethra into the bladder Change in vaginal pH (acid/ base balance) Allows for  alteration / disruption of the normal bacterial flora / microbiome Results in increased risk for urinary tract infection (UTI) due to bacterial overgrowth  Treatment options: Over-the-counter lubricants (see list below). Prescription vaginal estrogen replacement. Options: Topical vaginal estrogen (estradiol) cream: (Estrace, Premarin, compounded) Apply as directed Estring vaginal ring Exchanged every 3 months (either at home or in office by provider) Vagifem vaginal tablet Inserted nightly for 2 weeks then twice a week (long term) lntrarosa vaginal suppository Vaginal DHEA: converts to estrogen in vaginal tissue without systemic effect Inserted nightly (long term) 3. Vaginal laser therapy (Mona Edwina Gram touch) Performed in 3 treatments each 6 weeks apart (available in our [place] office). Can feel like a sunburn for 3-4 days after each treatment until new skin heals in. Usually not covered by insurance. Estimated cost is $1500 for all 3 sessions.  FYI regarding prescription vaginal estrogen treatment options: - All topical vaginal estrogen replacement options are equivalent in terms of efficacy. - Topical vaginal estrogen replacement will take about 3 months to be effective. - OK to have sex with any of the topical vaginal estrogen replacement options. - Topical vaginal estrogen replacement may sting/burn initially due to severe dryness, which will improve with ongoing treatment. - Studies have demonstrated negligible systemic absorption of low-dose intravaginal estrogen cream therefore the risk for cancer development or recurrence with this medication is minimal.  Genitourinary Syndrome of Menopause: AUA/SUFU/AUGS Guideline (2025) [place]Info.at  Topical vaginal estrogen cream safe to use with breast cancer  history WomenInsider.com.ee  Topical vaginal estrogen cream safe to use with blood clot history [email or web address]   Lubricants and Moisturizers for Treating Genitourinary Syndrome of Menopause and Vulvovaginal Atrophy Treatment Comments I Available Products   Lubricants   Water-based  Ingredients: Deionized water, glycerin, propylene glycol; latex safe; rare irritation; dry out with extended sexual activity Astroglide, Good Clean Love, K-Y Jelly, Natural, Organic, Pink, Sliquid, Sylk, Yes    Oil Based Ingredients: avocado, olive, peanut, corn; latex safe; can be used with silicone products; staining; safe (unless peanut allergy); non-irritating Coconut oil, vegetable oil, vitamin E oil  Silicone-Based Ingredients: Silicone polymers; staining; typically nonirritating, long lasting; waterproof; should not be used with silicone dilators, sexual toys, or gynecologic products Astroglide X, Oceanus Ultra Pure, Pink Silicone, Pjur Eros, Replens Silky Smooth, Silicone Premium JO, SKYN, Uberlube, [employer] Based Minimize harm to sperm motility; designed for couples trying to conceive:  Astroglide TTC, Conceive Plus, PreSeed, Yes Baby  Fertility Friendly Minimize harm to sperm motility; designed for couples trying to conceive:  Astroglide, TTC, Conceive Plus, PreSeed, Yes Baby  Vaginal Moisturizers   Vaginal Moisturizers For maintenance use 1 to 3 times weekly; can benefit women with dryness, chafing with AOL, and recurrent vaginal infections irrespective of sexual activity timing Balance Active Menopause Vaginal Moisturizing Lubricant, Canesintima Intimate Moisturizer, Replens, Rephresh, Sylk Natural Intimate Moisturizer, Yes Vaginal  Moisturizer  Hybrids Properties of both water and silicone-based products (combination of a vaginal lubricant and moisturizer); Non-irritating; good option for women with allergies and sensitivities Lubrigyn, Luvena  Suppositories Hyaluronic acid to retain moisture Revaree  Vulvar Soothing Creams/Oils    Medicated Creams Pain and burn relief; Ingredients: 4% Lidocaine , Aloe Vera gel Releveum (Desert Braden)  Non-Medicated Creams For anti-itch and moisture/maintenance; Ingredients: Coconut oil, Avocado oil, Shea Butter, Olive oil, Vitamin E Vajuvenate, Vmagic  Oils for moisture/maintenance: Coconut oil, Vitamin E oil, Emu oil  '           Overactive bladder (OAB) overview for patients:  Symptoms may include: urinary urgency (gotta go feeling) urinary frequency (voiding >8 times per day) night time urination (nocturia) urge incontinence of urine (UUI)  While we do not know the exact etiology of OAB, several treatment options exist including:  Behavioral therapy: Reducing fluid intake Decreasing bladder stimulants (such as caffeine) and irritants (such as acidic food, spicy foods, alcohol) Urge suppression strategies Bladder retraining via timed voiding  Pelvic floor physical therapy  Medication(s) - can use one or both of the drug classes below. Anticholinergic / antimuscarinic medications:  Mechanism of action: Activate M3 receptors to reduce detrusor stimulation and increase bladder capacity  (parasympathetic nervous system). Effect: Relaxes the bladder to decrease overactivity, increase bladder storage capacity, and increase time between voids. Onset: Slow acting (may take 8-12 weeks to determine efficacy). Medications include: Vesicare (Solifenacin), Ditropan (Oxybutynin), Detrol (Tolterodine), Toviaz (Fesoterodine), Sanctura (Trospium), Urispas (Flavoxate), Enablex (Darifenacin), Bentyl (Dicyclomine), Levsin (Hyoscyamine ). Potential side effects include but are  not limited to: Dry eyes, dry mouth, constipation, cognitive impairment, dementia risk with long term use, and urinary retention/ incomplete bladder emptying. Insurance companies generally prefer for patients to try 1-2 anticholinergic / antimuscarinic medications first due to low cost. Some exceptions are made based on patient-specific comorbidities / risk factors. Beta-3 agonist medications: Mechanism of action: Stimulates selective B3 adrenergic receptors to cause smooth muscle bladder relaxation (sympathetic nervous system). Effect: Relaxes the bladder to decrease overactivity, increase bladder storage capacity, and increase time between voids. Onset: Slow acting (may take 8-12 weeks to determine efficacy). Medications include: Myrbetriq (Mirabegron) and Vibegron Seferino Dade). Potential side effects include but are not limited to: urinary retention / incomplete bladder emptying and elevated blood pressure (more likely to occur in individuals with pre-existing uncontrolled hypertension).  These medications tend to be more expensive than the anticholinergic / antimuscarinic medications.   For patients with refractory OAB (if the above treatment options have been unsuccessful): Posterior tibial nerve stimulation (PTNS). Small acupuncture-type needle inserted near ankle with electric current to stimulate bladder via posterior tibial nerve pathway. Initially requires 12 weekly in-office treatments lasting 30 minutes each; followed by monthly in-office treatments lasting 30 minutes each for 1 year.  Bladder Botox injections. How it is done: Typically done via in-office cystoscopy; sometimes done in the OR depending on the situation. The bladder is numbed with lidocaine  instilled via a catheter. Then the urologist injects Botox into the bladder muscle wall in about 20 locations. Causes local paralysis of the bladder muscle at the injection sites to reduce bladder muscle overactivity / spasms. The effect  lasts for approximately 6 months and cannot be reversed once performed. Risks may included but are not limited to: infection, incomplete bladder emptying/ urinary retention, short term need for self-catheterization or indwelling catheter, and need for repeat therapy. There is a 5-12% chance of needing to catheterize with Botox - that usually resolves in a few months as the Botox wears off. Typically Botox injections would need to be repeated every 3-12 months since this is not a permanent therapy.  Sacral neuromodulation trial (Medtronic lnterStim or Axonics implant). Sacral neuromodulation is FDA-approved for uncontrolled urinary urgency, urinary frequency, urinary urge incontinence, non-obstructive urinary retention, or fecal incontinence. It is not FDA-approved as a treatment for pain. The goal of this therapy is at least a 50% improvement in symptoms. It is NOT realistic to expect a 100% cure. This is a a 2-step outpatient procedure. After a successful test period, a permanent wire and generator are placed in the OR. We discussed the risk of infection. We reviewed the fact that about 30% of patients fail the test phase and are not candidates for permanent generator placement. During the 1-2 week trial phase, symptoms are documented by the patient to determine response. If patient gets at least a 50% improvement in symptoms, they may then proceed with Step 2. Step 1: Trial lead placement. Per physician discretion, may done one of two ways: Percutaneous nerve evaluation (PNE) in the [facility] urology office. Performed by urologist under local anesthesia (numbing the area with lidocaine ) using a spinal needle for placement of test wire, which usually stays in place for 5-7 days to determine therapy response. Test lead placement in OR under anesthesia. Usually stays in place 2 weeks to determine therapy response. > Step 2: Permanent implantation of sacral neuromodulation device, which is performed in the  OR.  Sacral neuromodulation implants: All are conditionally MRI safe. Manufacturer: Medtronic Website: BuffaloDryCleaner.gl therapy/right-for-you.html Options: lnterStim X: Non-rechargeable. The battery lasts 10 years on average. lnterStim Micro: Rechargeable. The battery lasts 15 years on average and must be charged routinely. Approximately 50% smaller implant than lnterStim X implant.  Manufacturer: Axonics Website: Findrealrelief.axonics.com Options: Non-rechargeable (Axonics F15): The battery lasts 15 years on average. Rechargeable (Axonics R20): The battery lasts 20 years on average and must be charged in office for about 1 hour every 6-10 months on average. Approximately 50% smaller implant than Axonics non-rechargeable implant.  Note: Generally the rechargeable devices are only advised for very small or thin patients who may not have sufficient adipose tissue to comfortably overlay the implanted device.  Suprapubic catheter (SP tube) placement. Only done in severely refractory OAB when all other options have failed or are not a viable treatment choice depending on patient  factors. Involves placement of a catheter through the lower abdomen into the bladder to continuously drain the bladder into an external collection bag, which patient can then empty at their convenience every few hours. Done via an outpatient surgical procedure in the OR under anesthesia. Risks may included but are not limited to: surgical site pain, infections, skin irritation / breakdown, chronic bacteriuria, symptomatic UTls. The SP tube must stay in place continuously. This is a reversible procedure however - the insertion site will close if catheter is removed for more than a few hours. The SP tube must be exchanged routinely every 4 weeks to prevent the catheter from becoming clogged with sediment. SP tube exchanges are typically performed  at a urology nurse visit or by a home health nurse.

## 2023-11-25 DIAGNOSIS — J9601 Acute respiratory failure with hypoxia: Secondary | ICD-10-CM | POA: Diagnosis not present

## 2023-11-25 DIAGNOSIS — I4891 Unspecified atrial fibrillation: Secondary | ICD-10-CM | POA: Diagnosis not present

## 2023-11-25 DIAGNOSIS — I5033 Acute on chronic diastolic (congestive) heart failure: Secondary | ICD-10-CM | POA: Diagnosis not present

## 2023-12-02 DIAGNOSIS — L603 Nail dystrophy: Secondary | ICD-10-CM | POA: Diagnosis not present

## 2023-12-03 ENCOUNTER — Encounter (HOSPITAL_COMMUNITY): Payer: Self-pay

## 2023-12-03 ENCOUNTER — Other Ambulatory Visit: Payer: Self-pay

## 2023-12-03 ENCOUNTER — Encounter (HOSPITAL_COMMUNITY)
Admission: RE | Admit: 2023-12-03 | Discharge: 2023-12-03 | Disposition: A | Discharge status: Home / Self Care | Source: Ambulatory Visit | Attending: Internal Medicine | Admitting: Internal Medicine

## 2023-12-03 NOTE — Patient Instructions (Signed)
 PAT visit complete over the phone with the patient.

## 2023-12-07 DIAGNOSIS — I509 Heart failure, unspecified: Secondary | ICD-10-CM | POA: Diagnosis not present

## 2023-12-07 DIAGNOSIS — E1165 Type 2 diabetes mellitus with hyperglycemia: Secondary | ICD-10-CM | POA: Diagnosis not present

## 2023-12-08 ENCOUNTER — Encounter (HOSPITAL_COMMUNITY)
Admission: RE | Disposition: A | Discharge status: Home / Self Care | Payer: Self-pay | Source: Home / Self Care | Attending: Internal Medicine

## 2023-12-08 ENCOUNTER — Ambulatory Visit (HOSPITAL_COMMUNITY)
Admission: RE | Admit: 2023-12-08 | Discharge: 2023-12-08 | Disposition: A | Discharge status: Home / Self Care | Attending: Internal Medicine | Admitting: Internal Medicine

## 2023-12-08 ENCOUNTER — Ambulatory Visit (HOSPITAL_COMMUNITY): Admitting: Anesthesiology

## 2023-12-08 DIAGNOSIS — Z980 Intestinal bypass and anastomosis status: Secondary | ICD-10-CM | POA: Insufficient documentation

## 2023-12-08 DIAGNOSIS — K921 Melena: Secondary | ICD-10-CM | POA: Diagnosis not present

## 2023-12-08 DIAGNOSIS — F419 Anxiety disorder, unspecified: Secondary | ICD-10-CM | POA: Diagnosis not present

## 2023-12-08 DIAGNOSIS — I130 Hypertensive heart and chronic kidney disease with heart failure and stage 1 through stage 4 chronic kidney disease, or unspecified chronic kidney disease: Secondary | ICD-10-CM | POA: Diagnosis not present

## 2023-12-08 DIAGNOSIS — Z7952 Long term (current) use of systemic steroids: Secondary | ICD-10-CM | POA: Insufficient documentation

## 2023-12-08 DIAGNOSIS — K317 Polyp of stomach and duodenum: Secondary | ICD-10-CM | POA: Diagnosis not present

## 2023-12-08 DIAGNOSIS — Z87891 Personal history of nicotine dependence: Secondary | ICD-10-CM | POA: Insufficient documentation

## 2023-12-08 DIAGNOSIS — E039 Hypothyroidism, unspecified: Secondary | ICD-10-CM | POA: Insufficient documentation

## 2023-12-08 DIAGNOSIS — Z7984 Long term (current) use of oral hypoglycemic drugs: Secondary | ICD-10-CM | POA: Insufficient documentation

## 2023-12-08 DIAGNOSIS — J449 Chronic obstructive pulmonary disease, unspecified: Secondary | ICD-10-CM | POA: Insufficient documentation

## 2023-12-08 DIAGNOSIS — K449 Diaphragmatic hernia without obstruction or gangrene: Secondary | ICD-10-CM | POA: Diagnosis not present

## 2023-12-08 DIAGNOSIS — N189 Chronic kidney disease, unspecified: Secondary | ICD-10-CM | POA: Diagnosis not present

## 2023-12-08 DIAGNOSIS — Z6841 Body Mass Index (BMI) 40.0 and over, adult: Secondary | ICD-10-CM | POA: Insufficient documentation

## 2023-12-08 DIAGNOSIS — R0609 Other forms of dyspnea: Secondary | ICD-10-CM | POA: Insufficient documentation

## 2023-12-08 DIAGNOSIS — D124 Benign neoplasm of descending colon: Secondary | ICD-10-CM | POA: Diagnosis not present

## 2023-12-08 DIAGNOSIS — Z1211 Encounter for screening for malignant neoplasm of colon: Secondary | ICD-10-CM | POA: Insufficient documentation

## 2023-12-08 DIAGNOSIS — I25119 Atherosclerotic heart disease of native coronary artery with unspecified angina pectoris: Secondary | ICD-10-CM

## 2023-12-08 DIAGNOSIS — K635 Polyp of colon: Secondary | ICD-10-CM | POA: Diagnosis not present

## 2023-12-08 DIAGNOSIS — Z79899 Other long term (current) drug therapy: Secondary | ICD-10-CM | POA: Insufficient documentation

## 2023-12-08 DIAGNOSIS — E66813 Obesity, class 3: Secondary | ICD-10-CM | POA: Insufficient documentation

## 2023-12-08 DIAGNOSIS — Z79890 Hormone replacement therapy (postmenopausal): Secondary | ICD-10-CM | POA: Insufficient documentation

## 2023-12-08 DIAGNOSIS — K2970 Gastritis, unspecified, without bleeding: Secondary | ICD-10-CM | POA: Insufficient documentation

## 2023-12-08 DIAGNOSIS — Z955 Presence of coronary angioplasty implant and graft: Secondary | ICD-10-CM | POA: Diagnosis not present

## 2023-12-08 DIAGNOSIS — D122 Benign neoplasm of ascending colon: Secondary | ICD-10-CM | POA: Insufficient documentation

## 2023-12-08 DIAGNOSIS — E1122 Type 2 diabetes mellitus with diabetic chronic kidney disease: Secondary | ICD-10-CM | POA: Insufficient documentation

## 2023-12-08 DIAGNOSIS — G4730 Sleep apnea, unspecified: Secondary | ICD-10-CM | POA: Insufficient documentation

## 2023-12-08 DIAGNOSIS — K279 Peptic ulcer, site unspecified, unspecified as acute or chronic, without hemorrhage or perforation: Secondary | ICD-10-CM | POA: Diagnosis not present

## 2023-12-08 DIAGNOSIS — Z9981 Dependence on supplemental oxygen: Secondary | ICD-10-CM | POA: Diagnosis not present

## 2023-12-08 DIAGNOSIS — K2100 Gastro-esophageal reflux disease with esophagitis, without bleeding: Secondary | ICD-10-CM | POA: Diagnosis not present

## 2023-12-08 DIAGNOSIS — I4891 Unspecified atrial fibrillation: Secondary | ICD-10-CM | POA: Diagnosis not present

## 2023-12-08 DIAGNOSIS — Z7901 Long term (current) use of anticoagulants: Secondary | ICD-10-CM | POA: Insufficient documentation

## 2023-12-08 DIAGNOSIS — D120 Benign neoplasm of cecum: Secondary | ICD-10-CM | POA: Insufficient documentation

## 2023-12-08 DIAGNOSIS — I5032 Chronic diastolic (congestive) heart failure: Secondary | ICD-10-CM | POA: Insufficient documentation

## 2023-12-08 DIAGNOSIS — E2740 Unspecified adrenocortical insufficiency: Secondary | ICD-10-CM | POA: Insufficient documentation

## 2023-12-08 DIAGNOSIS — Z794 Long term (current) use of insulin: Secondary | ICD-10-CM | POA: Insufficient documentation

## 2023-12-08 DIAGNOSIS — D123 Benign neoplasm of transverse colon: Secondary | ICD-10-CM | POA: Insufficient documentation

## 2023-12-08 DIAGNOSIS — Z860100 Personal history of colon polyps, unspecified: Secondary | ICD-10-CM

## 2023-12-08 DIAGNOSIS — E1142 Type 2 diabetes mellitus with diabetic polyneuropathy: Secondary | ICD-10-CM | POA: Insufficient documentation

## 2023-12-08 DIAGNOSIS — I2510 Atherosclerotic heart disease of native coronary artery without angina pectoris: Secondary | ICD-10-CM | POA: Insufficient documentation

## 2023-12-08 DIAGNOSIS — E785 Hyperlipidemia, unspecified: Secondary | ICD-10-CM | POA: Insufficient documentation

## 2023-12-08 HISTORY — PX: ESOPHAGOGASTRODUODENOSCOPY: SHX5428

## 2023-12-08 HISTORY — PX: COLONOSCOPY: SHX5424

## 2023-12-08 LAB — GLUCOSE, CAPILLARY: Glucose-Capillary: 139 mg/dL — ABNORMAL HIGH (ref 70–99)

## 2023-12-08 SURGERY — COLONOSCOPY
Anesthesia: General

## 2023-12-08 MED ORDER — PROPOFOL 10 MG/ML IV BOLUS
INTRAVENOUS | Status: DC | PRN
Start: 2023-12-08 — End: 2023-12-08
  Administered 2023-12-08: 50 mg via INTRAVENOUS

## 2023-12-08 MED ORDER — PROPOFOL 500 MG/50ML IV EMUL
INTRAVENOUS | Status: DC | PRN
  Administered 2023-12-08: 125 ug/kg/min via INTRAVENOUS

## 2023-12-08 MED ORDER — PHENYLEPHRINE 80 MCG/ML (10ML) SYRINGE FOR IV PUSH (FOR BLOOD PRESSURE SUPPORT)
PREFILLED_SYRINGE | INTRAVENOUS | Status: AC
  Filled 2023-12-08: qty 20

## 2023-12-08 MED ORDER — PHENYLEPHRINE 80 MCG/ML (10ML) SYRINGE FOR IV PUSH (FOR BLOOD PRESSURE SUPPORT)
PREFILLED_SYRINGE | INTRAVENOUS | Status: DC | PRN
  Administered 2023-12-08 (×3): 120 ug via INTRAVENOUS

## 2023-12-08 MED ORDER — LACTATED RINGERS IV SOLN: INTRAVENOUS | Status: DC | PRN

## 2023-12-08 MED ORDER — LIDOCAINE 2% (20 MG/ML) 5 ML SYRINGE
INTRAMUSCULAR | Status: DC | PRN
  Administered 2023-12-08: 60 mg via INTRAVENOUS

## 2023-12-08 NOTE — Op Note (Signed)
 [facility] Patient Name: Ann [name] Procedure Date: 12/08/2023 11:06 AM MRN: [id number] Date of Birth: 01/02/1946 Attending MD: Carlin POUR. Cindie , [place], [id number] CSN: [id number] Age: 78 Admit Type: Outpatient Procedure:                Upper GI endoscopy Indications:              Gastro-esophageal reflux disease, Melena Providers:                Carlin POUR. Cindie, DO, Crystal Page, [place],                            Technician Referring MD:              Medicines:                See the Anesthesia note for documentation of the                            administered medications Complications:            No immediate complications. Estimated Blood Loss:     Estimated blood loss was minimal. Procedure:                Pre-Anesthesia Assessment:                           - The anesthesia plan was to use monitored                            anesthesia care (MAC).                           After obtaining informed consent, the endoscope was                            passed under direct vision. Throughout the                            procedure, the patient's blood pressure, pulse, and                            oxygen  saturations were monitored continuously. The                            GIF-H190 ([id number]) scope was introduced through the                            mouth, and advanced to the second part of duodenum.                            The upper GI endoscopy was accomplished without                            difficulty. The patient tolerated the procedure                            well. Scope In: 11:27:02  AM Scope Out: 11:35:22 AM Total Procedure Duration: 0 hours 8 minutes 20 seconds  Findings:      The examined esophagus was normal.      Patchy mild inflammation characterized by erythema was found in the       entire examined stomach. Biopsies were taken with a cold forceps for       Helicobacter pylori testing.      A single 8 mm sessile polyp with no  bleeding and no stigmata of recent       bleeding was found in the gastric antrum. The polyp was removed with a       cold snare. Resection and retrieval were complete. For hemostasis, one       hemostatic clip was successfully placed (MR safe). Clip manufacturer:       [employer]. There was no bleeding at the end of the procedure.      The duodenal bulb, first portion of the duodenum and second portion of       the duodenum were normal. Impression:               - Normal esophagus.                           - Gastritis. Biopsied.                           - A single gastric polyp. Resected and retrieved.                            Clip manufacturer: [employer]. Clip (MR                            safe) was placed.                           - Normal duodenal bulb, first portion of the                            duodenum and second portion of the duodenum. Moderate Sedation:      Per Anesthesia Care Recommendation:           - Patient has a contact number available for                            emergencies. The signs and symptoms of potential                            delayed complications were discussed with the                            patient. Return to normal activities tomorrow.                            Written discharge instructions were provided to the                            patient.                           -  Resume previous diet.                           - Continue present medications.                           - Await pathology results.                           - Return to GI clinic in 3 months. Procedure Code(s):        --- Professional ---                           [phone], Esophagogastroduodenoscopy, flexible,                            transoral; with removal of tumor(s), polyp(s), or                            other lesion(s) by snare technique                           43239, 59, Esophagogastroduodenoscopy, flexible,                             transoral; with biopsy, single or multiple Diagnosis Code(s):        --- Professional ---                           K29.70, Gastritis, unspecified, without bleeding                           K31.7, Polyp of stomach and duodenum                           K21.9, Gastro-esophageal reflux disease without                            esophagitis                           K92.1, Melena (includes Hematochezia) CPT copyright 2022 American Medical Association. All rights reserved. The codes documented in this report are preliminary and upon coder review may  be revised to meet current compliance requirements. Carlin POUR. Cindie, DO Carlin POUR. Cindie, DO 12/08/2023 11:40:47 AM This report has been signed electronically. Number of Addenda: 0

## 2023-12-08 NOTE — Transfer of Care (Signed)
 Immediate Anesthesia Transfer of Care Note  Patient: Ann [name]  Procedure(s) Performed: COLONOSCOPY EGD (ESOPHAGOGASTRODUODENOSCOPY)  Patient Location: Short Stay  Anesthesia Type:General  Level of Consciousness: awake and alert   Airway & Oxygen  Therapy: Patient Spontanous Breathing  Post-op Assessment: Report given to RN and Post -op Vital signs reviewed and stable  Post vital signs: Reviewed and stable  Last Vitals:  Vitals Value Taken Time  BP 116/76 12/08/23 12:23  Temp 36.6 C 12/08/23 12:23  Pulse 96 12/08/23 12:23  Resp 18 12/08/23 12:23  SpO2 100 % 12/08/23 12:23    Last Pain:  Vitals:   12/08/23 1223  PainSc: 0-No pain         Complications: No notable events documented.

## 2023-12-08 NOTE — Anesthesia Postprocedure Evaluation (Signed)
 Anesthesia Post Note  Patient: Ann [name]  Procedure(s) Performed: COLONOSCOPY EGD (ESOPHAGOGASTRODUODENOSCOPY)  Patient location during evaluation: Phase II Anesthesia Type: General Level of consciousness: awake and alert Pain management: pain level controlled Vital Signs Assessment: post-procedure vital signs reviewed and stable Respiratory status: spontaneous breathing, nonlabored ventilation and respiratory function stable Cardiovascular status: blood pressure returned to baseline and stable Postop Assessment: no apparent nausea or vomiting Anesthetic complications: no   There were no known notable events for this encounter.   Last Vitals:  Vitals:   12/08/23 1045 12/08/23 1223  BP: (!) 149/101 116/76  Pulse: 93 96  Resp: (!) 24 18  Temp: 36.9 C 36.6 C  SpO2: 93% 100%    Last Pain:  Vitals:   12/08/23 1223  PainSc: 0-No pain                 Ferdinando Lodge L Grae Leathers

## 2023-12-08 NOTE — Op Note (Signed)
 [facility] Patient Name: Ann [name] Procedure Date: 12/08/2023 11:05 AM MRN: [id number] Date of Birth: 18-Feb-1946 Attending MD: Carlin POUR. Cindie , [place], [id number] CSN: [id number] Age: 78 Admit Type: Outpatient Procedure:                Colonoscopy Indications:              High risk colon cancer surveillance: Personal                            history of colonic polyps Providers:                Carlin POUR. Cindie, DO, Crystal Page, [place],                            Technician Referring MD:              Medicines:                See the Anesthesia note for documentation of the                            administered medications Complications:            No immediate complications. Estimated Blood Loss:     Estimated blood loss was minimal. Procedure:                Pre-Anesthesia Assessment:                           - The anesthesia plan was to use monitored                            anesthesia care (MAC).                           After obtaining informed consent, the colonoscope                            was passed under direct vision. Throughout the                            procedure, the patient's blood pressure, pulse, and                            oxygen  saturations were monitored continuously. The                            [id number]-HQ190L ([id number]) scope was introduced through                            the anus and advanced to the the cecum, identified                            by appendiceal orifice and ileocecal valve. The                            colonoscopy was performed without difficulty. The  patient tolerated the procedure well. The quality                            of the bowel preparation was evaluated using the                            BBPS [facility] Bowel Preparation Scale) with scores                            of: Right Colon = 3, Transverse Colon = 3 and Left                            Colon = 3 (entire mucosa seen  well with no residual                            staining, small fragments of stool or opaque                            liquid). The total BBPS score equals 9. Scope In: 11:41:34 AM Scope Out: 12:16:31 PM Scope Withdrawal Time: 0 hours 32 minutes 55 seconds  Total Procedure Duration: 0 hours 34 minutes 57 seconds  Findings:      Eight sessile polyps were found in the ascending colon and cecum. The       polyps were 3 to 10 mm in size. These polyps were removed with a cold       snare. Resection and retrieval were complete.      13 sessile polyps were found in the descending colon and transverse       colon. The polyps were 3 to 6 mm in size. These polyps were removed with       a cold snare. Resection and retrieval were complete.      There was evidence of a prior side-to-side colo-colonic anastomosis in       the sigmoid colon. This was patent and was characterized by healthy       appearing mucosa. The anastomosis was traversed. Impression:               - Eight 3 to 10 mm polyps in the ascending colon                            and in the cecum, removed with a cold snare.                            Resected and retrieved.                           - 13 3 to 6 mm polyps in the descending colon and                            in the transverse colon, removed with a cold snare.                            Resected and retrieved. Moderate Sedation:      Per Anesthesia Care  Recommendation:           - Patient has a contact number available for                            emergencies. The signs and symptoms of potential                            delayed complications were discussed with the                            patient. Return to normal activities tomorrow.                            Written discharge instructions were provided to the                            patient.                           - Resume previous diet.                           - Continue present medications.                            - Await pathology results.                           - Repeat colonoscopy in 1 year for surveillance.                           - Return to GI clinic in 3 months. Procedure Code(s):        --- Professional ---                           [phone], Colonoscopy, flexible; with removal of                            tumor(s), polyp(s), or other lesion(s) by snare                            technique Diagnosis Code(s):        --- Professional ---                           Z86.010, Personal history of colonic polyps                           D12.2, Benign neoplasm of ascending colon                           D12.0, Benign neoplasm of cecum                           D12.4, Benign neoplasm of descending colon  D12.3, Benign neoplasm of transverse colon (hepatic                            flexure or splenic flexure) CPT copyright 2022 American Medical Association. All rights reserved. The codes documented in this report are preliminary and upon coder review may  be revised to meet current compliance requirements. Carlin POUR. Cindie, DO Carlin POUR. Cindie, DO 12/08/2023 12:23:47 PM This report has been signed electronically. Number of Addenda: 0

## 2023-12-08 NOTE — Anesthesia Preprocedure Evaluation (Addendum)
 Anesthesia Evaluation  Patient identified by MRN, date of birth, ID band Patient awake    Reviewed: Allergy & Precautions, NPO status , Patient's Chart, lab work & pertinent test results  History of Anesthesia Complications Negative for: history of anesthetic complications  Airway Mallampati: III  TM Distance: >3 FB Neck ROM: Full    Dental  (+) Edentulous Upper, Missing, Dental Advisory Given   Pulmonary sleep apnea , COPD,  oxygen  dependent, former smoker On 2 L of oxygen ,    Pulmonary exam normal breath sounds clear to auscultation       Cardiovascular Exercise Tolerance: Poor METS: hypertension, Pt. on medications + angina with exertion + CAD, + Cardiac Stents, +CHF and + DOE  Normal cardiovascular exam+ dysrhythmias Atrial Fibrillation  Rhythm:Irregular Rate:Normal - Systolic murmurs, - Diastolic murmurs and - Friction Rub 1. Left ventricular ejection fraction, by estimation, is 60 to 65%. The left ventricle has normal function. The left ventricle has no regional wall motion abnormalities. There is moderate left ventricular hypertrophy.  Left ventricular diastolic parameters are indeterminate.  2. Right ventricular systolic function is normal. The right ventricular size is normal.  3. Left atrial size was mildly dilated.  4. The mitral valve is normal in structure. No evidence of mitral valve regurgitation. No evidence of mitral stenosis.  5. The aortic valve has an indeterminant number of cusps. Aortic valve regurgitation is not visualized. No aortic stenosis is present.  6. The inferior vena cava is normal in size with greater than 50%  respiratory variability, suggesting right atrial pressure of 3 mmHg.  14-May-2020 11:44:48 [facility]-ER ROUTINE RECORD Atrial fibrillation Low voltage, extremity and precordial leads Non-specific ST-t changes Confirmed by Bernard Drivers ([id number]) on 05/14/2020 11:52:08  AM    Neuro/Psych   Anxiety      Neuromuscular disease    GI/Hepatic Neg liver ROS, hiatal hernia, PUD,GERD  Medicated and Controlled,,Colostomy    Endo/Other  diabetes, Well Controlled, Type 2, Insulin  DependentHypothyroidism  Class 3 obesityAdrenal insufficiency   Renal/GU Renal disease     Musculoskeletal  (+) Arthritis ,    Abdominal   Peds  Hematology  (+) Blood dyscrasia, anemia   Anesthesia Other Findings Stress test -10/2020 - negative   Reproductive/Obstetrics negative OB ROS                              Anesthesia Physical Anesthesia Plan  ASA: 4  Anesthesia Plan: General   Post-op Pain Management: Minimal or no pain anticipated   Induction: Intravenous  PONV Risk Score and Plan: Propofol  infusion  Airway Management Planned: Nasal Cannula and Natural Airway  Additional Equipment: None  Intra-op Plan:   Post-operative Plan:   Informed Consent: I have reviewed the patients History and Physical, chart, labs and discussed the procedure including the risks, benefits and alternatives for the proposed anesthesia with the patient or authorized representative who has indicated his/her understanding and acceptance.    Discussed DNR with patient and Suspend DNR.     Plan Discussed with: CRNA  Anesthesia Plan Comments: (Will give stress dose steroids)         Anesthesia Quick Evaluation

## 2023-12-08 NOTE — Discharge Instructions (Addendum)
 EGD Discharge instructions Please read the instructions outlined below and refer to this sheet in the next few weeks. These discharge instructions provide you with general information on caring for yourself after you leave the hospital. Your doctor may also give you specific instructions. While your treatment has been planned according to the most current medical practices available, unavoidable complications occasionally occur. If you have any problems or questions after discharge, please call your doctor. ACTIVITY You may resume your regular activity but move at a slower pace for the next 24 hours.  Take frequent rest periods for the next 24 hours.  Walking will help expel (get rid of) the air and reduce the bloated feeling in your abdomen.  No driving for 24 hours (because of the anesthesia (medicine) used during the test).  You may shower.  Do not sign any important legal documents or operate any machinery for 24 hours (because of the anesthesia used during the test).  NUTRITION Drink plenty of fluids.  You may resume your normal diet.  Begin with a light meal and progress to your normal diet.  Avoid alcoholic beverages for 24 hours or as instructed by your caregiver.  MEDICATIONS You may resume your normal medications unless your caregiver tells you otherwise.  WHAT YOU CAN EXPECT TODAY You may experience abdominal discomfort such as a feeling of fullness or "gas" pains.  FOLLOW-UP Your doctor will discuss the results of your test with you.  SEEK IMMEDIATE MEDICAL ATTENTION IF ANY OF THE FOLLOWING OCCUR: Excessive nausea (feeling sick to your stomach) and/or vomiting.  Severe abdominal pain and distention (swelling).  Trouble swallowing.  Temperature over 101 F (37.8 C).  Rectal bleeding or vomiting of blood.      Colonoscopy Discharge Instructions  Read the instructions outlined below and refer to this sheet in the next few weeks. These discharge instructions provide you  with general information on caring for yourself after you leave the hospital. Your doctor may also give you specific instructions. While your treatment has been planned according to the most current medical practices available, unavoidable complications occasionally occur.   ACTIVITY You may resume your regular activity, but move at a slower pace for the next 24 hours.  Take frequent rest periods for the next 24 hours.  Walking will help get rid of the air and reduce the bloated feeling in your belly (abdomen).  No driving for 24 hours (because of the medicine (anesthesia) used during the test).   Do not sign any important legal documents or operate any machinery for 24 hours (because of the anesthesia used during the test).  NUTRITION Drink plenty of fluids.  You may resume your normal diet as instructed by your doctor.  Begin with a light meal and progress to your normal diet. Heavy or fried foods are harder to digest and may make you feel sick to your stomach (nauseated).  Avoid alcoholic beverages for 24 hours or as instructed.  MEDICATIONS You may resume your normal medications unless your doctor tells you otherwise.  WHAT YOU CAN EXPECT TODAY Some feelings of bloating in the abdomen.  Passage of more gas than usual.  Spotting of blood in your stool or on the toilet paper.  IF YOU HAD POLYPS REMOVED DURING THE COLONOSCOPY: No aspirin  products for 7 days or as instructed.  No alcohol  for 7 days or as instructed.  Eat a soft diet for the next 24 hours.  FINDING OUT THE RESULTS OF YOUR TEST Not all test results  are available during your visit. If your test results are not back during the visit, make an appointment with your caregiver to find out the results. Do not assume everything is normal if you have not heard from your caregiver or the medical facility. It is important for you to follow up on all of your test results.  SEEK IMMEDIATE MEDICAL ATTENTION IF: You have more than a  spotting of blood in your stool.  Your belly is swollen (abdominal distention).  You are nauseated or vomiting.  You have a temperature over 101.  You have abdominal pain or discomfort that is severe or gets worse throughout the day.   Your EGD revealed mild amount inflammation in your stomach.  I took biopsies of this to rule out infection with a bacteria called H. pylori.  You also had one polyp which I removed. I placed a metallic clip on the post polypectomy site afterwards to prevent bleeding.  This will naturally fall off over the next few months.  Your colonoscopy revealed 21 polyp(s) which I removed successfully. Await pathology results, my office will contact you. I recommend repeating colonoscopy in 1 year for surveillance purposes.   Follow-up in GI office in 3 months.    I hope you have a great rest of your week!  Carlin POUR. Cindie, D.O. Gastroenterology and Hepatology [facility]   Restart Eliquis  in 2 days
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
## 2023-12-09 ENCOUNTER — Encounter (HOSPITAL_COMMUNITY): Payer: Self-pay | Admitting: Internal Medicine

## 2023-12-09 NOTE — Progress Notes (Signed)
 Patient complains of nausea with food. Encouraged her to eat small, non-greasy foods.  She does have nausea medication.  Encouraged sips ginger-ale/soda.  Advised if throws up coffee - ground emesis/bright red blood - proceed to emergency room.  Advised also if nausea continues 12/10/23 - call the office and let Dr. Cindie know.

## 2023-12-10 ENCOUNTER — Ambulatory Visit: Payer: Self-pay | Admitting: Internal Medicine

## 2023-12-10 ENCOUNTER — Other Ambulatory Visit: Payer: Self-pay

## 2023-12-10 DIAGNOSIS — E1165 Type 2 diabetes mellitus with hyperglycemia: Secondary | ICD-10-CM

## 2023-12-10 LAB — SURGICAL PATHOLOGY

## 2023-12-10 MED ORDER — FREESTYLE LIBRE 3 READER DEVI: 0 refills | Status: AC

## 2023-12-10 MED ORDER — FREESTYLE LIBRE 3 PLUS SENSOR MISC: 2 refills | Status: DC

## 2023-12-11 ENCOUNTER — Inpatient Hospital Stay: Attending: Hematology

## 2023-12-11 DIAGNOSIS — Z79899 Other long term (current) drug therapy: Secondary | ICD-10-CM | POA: Diagnosis not present

## 2023-12-11 DIAGNOSIS — D500 Iron deficiency anemia secondary to blood loss (chronic): Secondary | ICD-10-CM

## 2023-12-11 DIAGNOSIS — D509 Iron deficiency anemia, unspecified: Secondary | ICD-10-CM | POA: Insufficient documentation

## 2023-12-11 LAB — CBC WITH DIFFERENTIAL/PLATELET
Abs Immature Granulocytes: 0.05 K/uL (ref 0.00–0.07)
Basophils Absolute: 0 K/uL (ref 0.0–0.1)
Basophils Relative: 0 %
Eosinophils Absolute: 0.2 K/uL (ref 0.0–0.5)
Eosinophils Relative: 2 %
HCT: 42.3 % (ref 36.0–46.0)
Hemoglobin: 13.4 g/dL (ref 12.0–15.0)
Immature Granulocytes: 1 %
Lymphocytes Relative: 10 %
Lymphs Abs: 1 K/uL (ref 0.7–4.0)
MCH: 30.9 pg (ref 26.0–34.0)
MCHC: 31.7 g/dL (ref 30.0–36.0)
MCV: 97.5 fL (ref 80.0–100.0)
Monocytes Absolute: 0.6 K/uL (ref 0.1–1.0)
Monocytes Relative: 6 %
Neutro Abs: 8.6 K/uL — ABNORMAL HIGH (ref 1.7–7.7)
Neutrophils Relative %: 81 %
Platelets: 165 K/uL (ref 150–400)
RBC: 4.34 MIL/uL (ref 3.87–5.11)
RDW: 13.4 % (ref 11.5–15.5)
WBC: 10.6 K/uL — ABNORMAL HIGH (ref 4.0–10.5)
nRBC: 0 % (ref 0.0–0.2)

## 2023-12-11 LAB — IRON AND TIBC
Iron: 74 ug/dL (ref 28–170)
Saturation Ratios: 23 % (ref 10.4–31.8)
TIBC: 327 ug/dL (ref 250–450)
UIBC: 253 ug/dL

## 2023-12-11 LAB — FERRITIN: Ferritin: 72 ng/mL (ref 11–307)

## 2023-12-16 ENCOUNTER — Other Ambulatory Visit: Payer: Self-pay | Admitting: Nurse Practitioner

## 2023-12-16 NOTE — Progress Notes (Signed)
 Done

## 2023-12-18 ENCOUNTER — Inpatient Hospital Stay (HOSPITAL_BASED_OUTPATIENT_CLINIC_OR_DEPARTMENT_OTHER): Admitting: Oncology

## 2023-12-18 VITALS — BP 146/72 | HR 78 | Temp 97.7°F | Resp 20 | Wt 265.7 lb

## 2023-12-18 DIAGNOSIS — D509 Iron deficiency anemia, unspecified: Secondary | ICD-10-CM | POA: Diagnosis not present

## 2023-12-18 DIAGNOSIS — D500 Iron deficiency anemia secondary to blood loss (chronic): Secondary | ICD-10-CM | POA: Diagnosis not present
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
## 2023-12-24 ENCOUNTER — Other Ambulatory Visit: Admitting: Urology

## 2023-12-24 DIAGNOSIS — N3281 Overactive bladder: Secondary | ICD-10-CM

## 2023-12-25 ENCOUNTER — Inpatient Hospital Stay

## 2023-12-25 VITALS — BP 114/63 | HR 77 | Temp 97.2°F | Resp 16

## 2023-12-25 DIAGNOSIS — D500 Iron deficiency anemia secondary to blood loss (chronic): Secondary | ICD-10-CM

## 2023-12-25 DIAGNOSIS — D509 Iron deficiency anemia, unspecified: Secondary | ICD-10-CM | POA: Diagnosis not present

## 2023-12-25 MED ORDER — SODIUM CHLORIDE 0.9% FLUSH: 10.0000 mL | Freq: Two times a day (BID) | INTRAVENOUS | Status: DC

## 2023-12-25 MED ORDER — SODIUM CHLORIDE 0.9 % IV SOLN: INTRAVENOUS | Status: DC

## 2023-12-25 MED ORDER — SODIUM CHLORIDE 0.9 % IV SOLN
400.0000 mg | Freq: Once | INTRAVENOUS | Status: AC
  Administered 2023-12-25: 400 mg via INTRAVENOUS
  Filled 2023-12-25: qty 400

## 2023-12-25 MED ORDER — CETIRIZINE HCL 10 MG PO TABS
10.0000 mg | ORAL_TABLET | Freq: Once | ORAL | Status: AC
  Administered 2023-12-25: 10 mg via ORAL
  Filled 2023-12-25: qty 1

## 2023-12-25 MED ORDER — ACETAMINOPHEN 325 MG PO TABS
650.0000 mg | ORAL_TABLET | Freq: Once | ORAL | Status: AC
Start: 2023-12-25 — End: 2023-12-25
  Administered 2023-12-25: 650 mg via ORAL
  Filled 2023-12-25: qty 2

## 2023-12-25 MED ORDER — METHYLPREDNISOLONE SODIUM SUCC 125 MG IJ SOLR
125.0000 mg | Freq: Once | INTRAMUSCULAR | Status: AC
  Administered 2023-12-25: 125 mg via INTRAVENOUS
  Filled 2023-12-25: qty 2

## 2023-12-25 MED ORDER — FAMOTIDINE IN NACL 20-0.9 MG/50ML-% IV SOLN
20.0000 mg | Freq: Once | INTRAVENOUS | Status: AC
  Administered 2023-12-25: 20 mg via INTRAVENOUS
  Filled 2023-12-25: qty 50

## 2023-12-25 NOTE — Patient Instructions (Signed)

## 2023-12-25 NOTE — Progress Notes (Signed)
 Patient tolerated iron infusion with no complaints voiced.  Peripheral IV site clean and dry with good blood return noted before and after infusion.  Band aid applied.  VSS with discharge and left in satisfactory condition with no s/s of distress noted.

## 2023-12-26 DIAGNOSIS — I5033 Acute on chronic diastolic (congestive) heart failure: Secondary | ICD-10-CM | POA: Diagnosis not present

## 2023-12-26 DIAGNOSIS — I4891 Unspecified atrial fibrillation: Secondary | ICD-10-CM | POA: Diagnosis not present

## 2023-12-26 DIAGNOSIS — J9601 Acute respiratory failure with hypoxia: Secondary | ICD-10-CM | POA: Diagnosis not present

## 2023-12-29 ENCOUNTER — Telehealth: Payer: Self-pay

## 2023-12-29 NOTE — Telephone Encounter (Signed)
 Pt called wanting results of her procedure. Advised the pt that I has sent to her MyChart in July and it showed where someone had rear it. She advised me that it was probably her son (who lives in another state) and he didn't tell her anything. Pt is requesting that from know on to send her a letter regarding procedures / lab results. Sending out a letter to the pt with her results.  Transferred the call to [facility] S phone because pt is wanting something sent in for her abd pain.

## 2024-01-07 DIAGNOSIS — I509 Heart failure, unspecified: Secondary | ICD-10-CM | POA: Diagnosis not present

## 2024-01-07 DIAGNOSIS — E1165 Type 2 diabetes mellitus with hyperglycemia: Secondary | ICD-10-CM | POA: Diagnosis not present

## 2024-01-26 DIAGNOSIS — I5033 Acute on chronic diastolic (congestive) heart failure: Secondary | ICD-10-CM | POA: Diagnosis not present

## 2024-01-26 DIAGNOSIS — I4891 Unspecified atrial fibrillation: Secondary | ICD-10-CM | POA: Diagnosis not present

## 2024-01-26 DIAGNOSIS — J9601 Acute respiratory failure with hypoxia: Secondary | ICD-10-CM | POA: Diagnosis not present

## 2024-02-09 DIAGNOSIS — E1165 Type 2 diabetes mellitus with hyperglycemia: Secondary | ICD-10-CM | POA: Diagnosis not present

## 2024-02-09 DIAGNOSIS — I509 Heart failure, unspecified: Secondary | ICD-10-CM | POA: Diagnosis not present

## 2024-02-10 ENCOUNTER — Encounter: Payer: Self-pay | Admitting: Oncology

## 2024-02-10 DIAGNOSIS — L603 Nail dystrophy: Secondary | ICD-10-CM | POA: Diagnosis not present

## 2024-02-13 ENCOUNTER — Other Ambulatory Visit: Payer: Self-pay

## 2024-02-13 DIAGNOSIS — E1165 Type 2 diabetes mellitus with hyperglycemia: Secondary | ICD-10-CM

## 2024-02-13 MED ORDER — FREESTYLE LIBRE 3 PLUS SENSOR MISC: 1 refills | Status: DC

## 2024-02-18 ENCOUNTER — Other Ambulatory Visit: Admitting: Urology

## 2024-02-18 ENCOUNTER — Other Ambulatory Visit

## 2024-02-18 ENCOUNTER — Telehealth: Payer: Self-pay

## 2024-02-18 DIAGNOSIS — N390 Urinary tract infection, site not specified: Secondary | ICD-10-CM

## 2024-02-18 NOTE — Telephone Encounter (Signed)
 Dysuria  Patient called with c/o dysuria x 2-3 days days.  Associated Signs and Symptoms:  Fever: no Chills: no Hematuria: no Urgency: yes Frequency: yes Hesitancy:yes Incontinence: yes Nausea: no Vomiting: no  Urologic History:  Any Recent Urologic Surgeries or Procedures:no Recurrent UTI's:yes Cystitis: no  Prostatitis:no Kidney or Bladder Stones: no Plan: Walk-in Clinic: patient states she will come Friday for urine drop off Appointment w/Physician: [no Lab visit scheduled for urine drop off: Yes Advice given:  Do you take on daily medications for UTI suppression No

## 2024-02-20 ENCOUNTER — Other Ambulatory Visit

## 2024-02-20 DIAGNOSIS — N390 Urinary tract infection, site not specified: Secondary | ICD-10-CM | POA: Diagnosis not present

## 2024-02-20 LAB — MICROSCOPIC EXAMINATION: WBC, UA: >30 /HPF — AB (ref 0–5)

## 2024-02-20 LAB — URINALYSIS, ROUTINE W REFLEX MICROSCOPIC
Bilirubin, UA: NEGATIVE
Glucose, UA: NEGATIVE
Ketones, UA: NEGATIVE
Nitrite, UA: POSITIVE — AB
Protein,UA: NEGATIVE
RBC, UA: NEGATIVE
Specific Gravity, UA: 1.015 (ref 1.005–1.030)
Urobilinogen, Ur: 1 mg/dL (ref 0.2–1.0)
pH, UA: 6.5 (ref 5.0–7.5)

## 2024-02-20 MED ORDER — NITROFURANTOIN MONOHYD MACRO 100 MG PO CAPS: 100.0000 mg | ORAL_CAPSULE | Freq: Two times a day (BID) | ORAL | 0 refills | Status: DC

## 2024-02-20 NOTE — Addendum Note (Signed)
 Addended by: GRETTA MASTERS R on: 02/20/2024 02:47 PM   Modules accepted: Orders

## 2024-02-20 NOTE — Telephone Encounter (Signed)
 Patient presents today with complaints of  Urgency, frequency, and incontinence .  UA and Culture done today.  Dr. Sherrilee reviewed results and Macrobid  100 mg BID X 7 days .  Patient aware of MD recommendations and that we will reach out with culture results.      Dyjwpvlj, CMA

## 2024-02-23 LAB — URINE CULTURE

## 2024-02-24 ENCOUNTER — Ambulatory Visit: Payer: Self-pay

## 2024-02-24 MED ORDER — FOSFOMYCIN TROMETHAMINE 3 G PO PACK: 3.0000 g | PACK | Freq: Once | ORAL | 1 refills | Status: AC

## 2024-02-24 NOTE — Telephone Encounter (Signed)
 Patient called and made aware to stop Macrobid  and start Fosfomycin due to positive urine culture per Dr. Sherrilee. Patient advised to take 1st day today and wait 72 hrs and take last dose. Patient voiced understanding.

## 2024-02-25 DIAGNOSIS — J9601 Acute respiratory failure with hypoxia: Secondary | ICD-10-CM | POA: Diagnosis not present

## 2024-02-25 DIAGNOSIS — I4891 Unspecified atrial fibrillation: Secondary | ICD-10-CM | POA: Diagnosis not present

## 2024-02-25 DIAGNOSIS — I5033 Acute on chronic diastolic (congestive) heart failure: Secondary | ICD-10-CM | POA: Diagnosis not present

## 2024-02-26 DIAGNOSIS — E1165 Type 2 diabetes mellitus with hyperglycemia: Secondary | ICD-10-CM | POA: Diagnosis not present

## 2024-02-26 DIAGNOSIS — J449 Chronic obstructive pulmonary disease, unspecified: Secondary | ICD-10-CM | POA: Diagnosis not present

## 2024-02-26 DIAGNOSIS — M159 Polyosteoarthritis, unspecified: Secondary | ICD-10-CM | POA: Diagnosis not present

## 2024-02-26 DIAGNOSIS — Z993 Dependence on wheelchair: Secondary | ICD-10-CM | POA: Diagnosis not present

## 2024-02-26 DIAGNOSIS — Z741 Need for assistance with personal care: Secondary | ICD-10-CM | POA: Diagnosis not present

## 2024-02-26 DIAGNOSIS — Z9981 Dependence on supplemental oxygen: Secondary | ICD-10-CM | POA: Diagnosis not present

## 2024-02-26 DIAGNOSIS — I509 Heart failure, unspecified: Secondary | ICD-10-CM | POA: Diagnosis not present

## 2024-02-26 DIAGNOSIS — R2689 Other abnormalities of gait and mobility: Secondary | ICD-10-CM | POA: Diagnosis not present

## 2024-02-27 ENCOUNTER — Ambulatory Visit (INDEPENDENT_AMBULATORY_CARE_PROVIDER_SITE_OTHER): Admitting: Urology

## 2024-02-27 VITALS — BP 111/76 | HR 97

## 2024-02-27 DIAGNOSIS — N3001 Acute cystitis with hematuria: Secondary | ICD-10-CM | POA: Diagnosis not present

## 2024-02-27 DIAGNOSIS — Z87440 Personal history of urinary (tract) infections: Secondary | ICD-10-CM | POA: Diagnosis not present

## 2024-02-27 DIAGNOSIS — N3281 Overactive bladder: Secondary | ICD-10-CM

## 2024-02-27 DIAGNOSIS — N390 Urinary tract infection, site not specified: Secondary | ICD-10-CM

## 2024-02-27 DIAGNOSIS — R300 Dysuria: Secondary | ICD-10-CM | POA: Diagnosis not present

## 2024-02-27 LAB — URINALYSIS, ROUTINE W REFLEX MICROSCOPIC
Bilirubin, UA: NEGATIVE
Ketones, UA: NEGATIVE
Nitrite, UA: NEGATIVE
Protein,UA: NEGATIVE
Specific Gravity, UA: 1.015 (ref 1.005–1.030)
Urobilinogen, Ur: 1 mg/dL (ref 0.2–1.0)
pH, UA: 7 (ref 5.0–7.5)

## 2024-02-27 LAB — MICROSCOPIC EXAMINATION

## 2024-02-27 MED ORDER — ONABOTULINUMTOXINA 100 UNITS IJ SOLR: 100.0000 [IU] | Freq: Once | INTRAMUSCULAR | Status: DC

## 2024-02-27 MED ORDER — CIPROFLOXACIN HCL 500 MG PO TABS: 500.0000 mg | ORAL_TABLET | Freq: Once | ORAL | Status: DC

## 2024-02-27 MED ORDER — TROSPIUM CHLORIDE ER 60 MG PO CP24: 1.0000 | ORAL_CAPSULE | Freq: Every day | ORAL | 11 refills | Status: AC

## 2024-02-27 MED ORDER — LIDOCAINE HCL 1 % IJ SOLN: 20.0000 mL | Freq: Once | INTRAMUSCULAR | Status: DC
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
## 2024-03-02 ENCOUNTER — Encounter: Payer: Self-pay | Admitting: "Endocrinology

## 2024-03-02 ENCOUNTER — Encounter: Payer: Self-pay | Admitting: Oncology

## 2024-03-02 ENCOUNTER — Telehealth: Payer: Self-pay

## 2024-03-02 ENCOUNTER — Other Ambulatory Visit (HOSPITAL_COMMUNITY): Payer: Self-pay

## 2024-03-02 ENCOUNTER — Ambulatory Visit: Admitting: "Endocrinology

## 2024-03-02 VITALS — BP 106/68 | HR 72

## 2024-03-02 DIAGNOSIS — I10 Essential (primary) hypertension: Secondary | ICD-10-CM | POA: Diagnosis not present

## 2024-03-02 DIAGNOSIS — E782 Mixed hyperlipidemia: Secondary | ICD-10-CM | POA: Diagnosis not present

## 2024-03-02 DIAGNOSIS — E1165 Type 2 diabetes mellitus with hyperglycemia: Secondary | ICD-10-CM | POA: Diagnosis not present

## 2024-03-02 DIAGNOSIS — Z794 Long term (current) use of insulin: Secondary | ICD-10-CM

## 2024-03-02 DIAGNOSIS — E2740 Unspecified adrenocortical insufficiency: Secondary | ICD-10-CM

## 2024-03-02 DIAGNOSIS — E039 Hypothyroidism, unspecified: Secondary | ICD-10-CM | POA: Diagnosis not present

## 2024-03-02 LAB — POCT GLYCOSYLATED HEMOGLOBIN (HGB A1C): HbA1c, POC (controlled diabetic range): 9.6 % — AB (ref 0.0–7.0)

## 2024-03-02 MED ORDER — FREESTYLE LIBRE 3 READER DEVI
1.0000 | Freq: Once | 0 refills | Status: DC | PRN
Start: 2024-03-02 — End: 2024-03-02

## 2024-03-02 MED ORDER — TIRZEPATIDE 2.5 MG/0.5ML ~~LOC~~ SOAJ: 2.5000 mg | SUBCUTANEOUS | 0 refills | Status: DC

## 2024-03-02 NOTE — Telephone Encounter (Signed)
 Pharmacy Patient Advocate Encounter   Received notification from CoverMyMeds that prior authorization for Mounjaro 2.5MG /0.5ML auto-injectors is required/requested.   Insurance verification completed.   The patient is insured through [employer].   Per test claim: PA required and submitted KEY/EOC/Request #: [id number] due to The patient currently has access to the requested medication and a Prior Authorization is not needed

## 2024-03-02 NOTE — Patient Instructions (Signed)
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
## 2024-03-02 NOTE — Addendum Note (Signed)
 Addended by: CLAUDENE ZADA POUR on: 03/02/2024 04:55 PM   Modules accepted: Orders

## 2024-03-03 ENCOUNTER — Telehealth: Payer: Self-pay

## 2024-03-03 MED ORDER — NITROFURANTOIN MONOHYD MACRO 100 MG PO CAPS: 100.0000 mg | ORAL_CAPSULE | Freq: Two times a day (BID) | ORAL | 0 refills | Status: DC

## 2024-03-03 NOTE — Telephone Encounter (Signed)
 Macrobid  100 mg po x 10 days sent in to pharmacy per positive Vikor test.  Patient called and made aware.

## 2024-03-04 NOTE — Patient Instructions (Signed)
 Overactive Bladder in Adults: What to Know  Overactive bladder (OAB) is when you have trouble holding your pee. You may feel the need to pee often or all of a sudden. You may also leak pee if you can't get to a bathroom fast enough. These symptoms may get in the way of your daily life. What are the causes? OAB may be caused by poor nerve signals between your bladder and your brain. Your bladder may get the signal to empty before it's full. Or, your bladder may be too sensitive. This can cause it to squeeze too soon. Other causes include: Medical problems, such as: A urinary tract infection (UTI). Swelling or stones in your bladder. A tumor, which is a growth of cells that isn't normal. Diabetes. Muscle or nerve weakness. This may be caused by: An injury to your spinal cord. A stroke. Multiple sclerosis. Surgery on nearby tissues. Drinking caffeine or alcohol. Some medicines. These include ones to lower the amount of fluid in your body. Trouble pooping, also called constipation. What increases the risk? You may be more at risk if: You're an older adult. You're going through menopause. You have a big prostate or problems with your prostate. You're overweight. You smoke. You eat or drink things that irritate your bladder. These may include tea or spicy food. What are the signs or symptoms? A sudden, strong need to pee. Peeing 8 or more times a day. Leaking pee. Waking up to pee 2 or more times a night. How is this diagnosed? You may be diagnosed based on your symptoms, medical history, and an exam. You may also have tests. These may include: Blood tests. Tests of your pee to check for an infection. Tests to check the flow of your pee. These are called urodynamic tests. You may also need to see an expert in problems with the bladder called a urologist. How is this treated? Treatment depends on the cause and how bad your symptoms are. It may include: Bladder training, such  as: Learning to control the urge to pee by peeing at certain times. Doing Kegel exercises. These can help you strengthen the muscles that start and stop the flow of pee. Special devices, such as: Biofeedback. This uses sensors to help you learn when to pee. Electrical stimulation. This uses electricity to make the nerves and muscles of your bladder work. A pessary. This can be put in the vagina to support the bladder. Medicines to: Treat a UTI. Stop the bladder from releasing pee at the wrong time. Calm the bladder muscles. Surgery to: Help the nerve signals that control peeing. Change the shape of your bladder. This is done only in very bad cases. Follow these instructions at home: Eating and drinking  Make changes to your diet as told. You may need to: Drink small amounts of fluid during the day rather than large amounts at one time. Take in less caffeine or alcohol. Take steps to help treat or prevent trouble pooping. You may need to eat foods high in fiber, like beans, whole grains, and fresh fruits and vegetables. Lifestyle Lose weight if needed. Do not smoke, vape, or use nicotine or tobacco. General instructions Take your medicines only as told. If you were given antibiotics, take them as told. Do not stop taking them even if you start to feel better. Use devices as told. If needed, wear pads to absorb pee leakage. Keep track of how much you drink, when you drink, and when you need to pee. Contact  a health care provider if: Your symptoms don't get better with treatment. You can't control your bladder. You have a fever or chills. This information is not intended to replace advice given to you by your health care provider. Make sure you discuss any questions you have with your health care provider. Document Revised: 12/09/2022 Document Reviewed: 12/09/2022 [employer] Patient Education  2024 [employer].

## 2024-03-27 DIAGNOSIS — I5033 Acute on chronic diastolic (congestive) heart failure: Secondary | ICD-10-CM | POA: Diagnosis not present

## 2024-03-27 DIAGNOSIS — I4891 Unspecified atrial fibrillation: Secondary | ICD-10-CM | POA: Diagnosis not present

## 2024-03-30 ENCOUNTER — Ambulatory Visit (INDEPENDENT_AMBULATORY_CARE_PROVIDER_SITE_OTHER): Admitting: Gastroenterology

## 2024-03-30 ENCOUNTER — Encounter: Payer: Self-pay | Admitting: Gastroenterology

## 2024-03-30 VITALS — BP 97/64 | HR 55 | Temp 97.7°F | Ht 63.0 in | Wt 257.4 lb

## 2024-03-30 DIAGNOSIS — D509 Iron deficiency anemia, unspecified: Secondary | ICD-10-CM

## 2024-03-30 DIAGNOSIS — R110 Nausea: Secondary | ICD-10-CM

## 2024-03-30 DIAGNOSIS — R1013 Epigastric pain: Secondary | ICD-10-CM

## 2024-03-30 DIAGNOSIS — K317 Polyp of stomach and duodenum: Secondary | ICD-10-CM | POA: Diagnosis not present

## 2024-03-30 DIAGNOSIS — R197 Diarrhea, unspecified: Secondary | ICD-10-CM

## 2024-03-30 DIAGNOSIS — D369 Benign neoplasm, unspecified site: Secondary | ICD-10-CM | POA: Diagnosis not present

## 2024-03-30 DIAGNOSIS — K219 Gastro-esophageal reflux disease without esophagitis: Secondary | ICD-10-CM | POA: Diagnosis not present

## 2024-03-30 MED ORDER — ESOMEPRAZOLE MAGNESIUM 40 MG PO CPDR: 40.0000 mg | DELAYED_RELEASE_CAPSULE | Freq: Two times a day (BID) | ORAL | 3 refills | Status: AC

## 2024-03-30 MED ORDER — IRON (FERROUS SULFATE) 325 (65 FE) MG PO TABS: 325.0000 mg | ORAL_TABLET | Freq: Every day | ORAL | 3 refills | Status: AC

## 2024-03-30 MED ORDER — FAMOTIDINE 20 MG PO TABS: 20.0000 mg | ORAL_TABLET | Freq: Every day | ORAL | 3 refills | Status: AC | PRN

## 2024-03-30 MED ORDER — ONDANSETRON 4 MG PO TBDP: 4.0000 mg | ORAL_TABLET | Freq: Two times a day (BID) | ORAL | 2 refills | Status: AC | PRN
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
## 2024-03-30 NOTE — Patient Instructions (Addendum)
 I have sent in Nexium , iron , and zofran  to [facility] for you.  I sent in Pepcid  (famotidine ) 20 mg tablets for you to [facility].  I want you to start using this at the onset of nausea to see if this is any breakthrough reflux to see if that helps your symptoms.  I suspect that some of your diarrhea and urgency may be secondary to some actual baseline constipation given you sometimes go several days without a bowel movement or have normal bowel movements followed by diarrhea.  I want you to start taking 1 tablespoon of Metamucil daily.  If you start noticing you are having lots of diarrhea then I want you to decrease this to every other day.  Is not a laxative but is a fiber supplementation to help you go more regularly and I suspect that if you go more regularly you will have these up-and-down fluctuations in your bowel habits.  In regards to some of the tenderness on the underside of your abdomen I want you to ensure you are drying adequately and for hygiene and may apply any over-the-counter antifungal powder or cream to your skin and monitor for worsening itching, pain, or redness.  If it is continuing then I want you to reach back out to Dr. Alexa office and discuss the need for nystatin  powder again.  Follow-up in 3 months.  It was a pleasure to see you today. I want to create trusting relationships with patients. If you receive a survey regarding your visit,  I greatly appreciate you taking time to fill this out on paper or through your MyChart. I value your feedback.  Charmaine Melia, MSN, FNP-BC, AGACNP-BC [facility]

## 2024-04-02 ENCOUNTER — Ambulatory Visit (HOSPITAL_COMMUNITY)
Admission: RE | Admit: 2024-04-02 | Discharge: 2024-04-02 | Disposition: A | Discharge status: Home / Self Care | Source: Ambulatory Visit | Attending: Gerontology | Admitting: Gerontology

## 2024-04-02 ENCOUNTER — Other Ambulatory Visit (HOSPITAL_COMMUNITY): Payer: Self-pay | Admitting: Gerontology

## 2024-04-02 ENCOUNTER — Other Ambulatory Visit (HOSPITAL_COMMUNITY)
Admission: RE | Admit: 2024-04-02 | Discharge: 2024-04-02 | Disposition: A | Discharge status: Home / Self Care | Source: Ambulatory Visit | Attending: Internal Medicine | Admitting: Internal Medicine

## 2024-04-02 DIAGNOSIS — I700 Atherosclerosis of aorta: Secondary | ICD-10-CM | POA: Insufficient documentation

## 2024-04-02 DIAGNOSIS — I517 Cardiomegaly: Secondary | ICD-10-CM | POA: Insufficient documentation

## 2024-04-02 DIAGNOSIS — K219 Gastro-esophageal reflux disease without esophagitis: Secondary | ICD-10-CM | POA: Diagnosis not present

## 2024-04-02 DIAGNOSIS — Z9981 Dependence on supplemental oxygen: Secondary | ICD-10-CM | POA: Diagnosis not present

## 2024-04-02 DIAGNOSIS — E1165 Type 2 diabetes mellitus with hyperglycemia: Secondary | ICD-10-CM | POA: Insufficient documentation

## 2024-04-02 DIAGNOSIS — I952 Hypotension due to drugs: Secondary | ICD-10-CM | POA: Diagnosis not present

## 2024-04-02 DIAGNOSIS — R0602 Shortness of breath: Secondary | ICD-10-CM

## 2024-04-02 DIAGNOSIS — J441 Chronic obstructive pulmonary disease with (acute) exacerbation: Secondary | ICD-10-CM | POA: Diagnosis not present

## 2024-04-02 DIAGNOSIS — I509 Heart failure, unspecified: Secondary | ICD-10-CM | POA: Diagnosis not present

## 2024-04-02 LAB — HEPATIC FUNCTION PANEL
ALT: 9 U/L (ref 0–44)
AST: 20 U/L (ref 15–41)
Albumin: 3.7 g/dL (ref 3.5–5.0)
Alkaline Phosphatase: 91 U/L (ref 38–126)
Bilirubin, Direct: 0.5 mg/dL — ABNORMAL HIGH (ref 0.0–0.2)
Indirect Bilirubin: 0.6 mg/dL (ref 0.3–0.9)
Total Bilirubin: 1.1 mg/dL (ref 0.0–1.2)
Total Protein: 7.1 g/dL (ref 6.5–8.1)

## 2024-04-02 LAB — CBC WITH DIFFERENTIAL/PLATELET
Abs Immature Granulocytes: 0.08 K/uL — ABNORMAL HIGH (ref 0.00–0.07)
Basophils Absolute: 0 K/uL (ref 0.0–0.1)
Basophils Relative: 0 %
Eosinophils Absolute: 0.2 K/uL (ref 0.0–0.5)
Eosinophils Relative: 1 %
HCT: 46 % (ref 36.0–46.0)
Hemoglobin: 14.6 g/dL (ref 12.0–15.0)
Immature Granulocytes: 1 %
Lymphocytes Relative: 6 %
Lymphs Abs: 0.9 K/uL (ref 0.7–4.0)
MCH: 30.7 pg (ref 26.0–34.0)
MCHC: 31.7 g/dL (ref 30.0–36.0)
MCV: 96.8 fL (ref 80.0–100.0)
Monocytes Absolute: 0.8 K/uL (ref 0.1–1.0)
Monocytes Relative: 5 %
Neutro Abs: 14.6 K/uL — ABNORMAL HIGH (ref 1.7–7.7)
Neutrophils Relative %: 87 %
Platelets: 182 K/uL (ref 150–400)
RBC: 4.75 MIL/uL (ref 3.87–5.11)
RDW: 13.3 % (ref 11.5–15.5)
WBC: 16.6 K/uL — ABNORMAL HIGH (ref 4.0–10.5)
nRBC: 0 % (ref 0.0–0.2)

## 2024-04-02 LAB — BASIC METABOLIC PANEL WITH GFR
Anion gap: 15 (ref 5–15)
BUN: 26 mg/dL — ABNORMAL HIGH (ref 8–23)
CO2: 28 mmol/L (ref 22–32)
Calcium: 9.5 mg/dL (ref 8.9–10.3)
Chloride: 100 mmol/L (ref 98–111)
Creatinine, Ser: 2.19 mg/dL — ABNORMAL HIGH (ref 0.44–1.00)
GFR, Estimated: 22 mL/min — ABNORMAL LOW (ref >60)
Glucose, Bld: 203 mg/dL — ABNORMAL HIGH (ref 70–99)
Potassium: 4.1 mmol/L (ref 3.5–5.1)
Sodium: 142 mmol/L (ref 135–145)

## 2024-04-02 LAB — LIPID PANEL
Cholesterol: 109 mg/dL (ref 0–200)
HDL: 32 mg/dL — ABNORMAL LOW (ref >40)
LDL Cholesterol: 36 mg/dL (ref 0–99)
Total CHOL/HDL Ratio: 3.4 ratio
Triglycerides: 206 mg/dL — ABNORMAL HIGH (ref <150)
VLDL: 41 mg/dL — ABNORMAL HIGH (ref 0–40)

## 2024-04-02 LAB — HEMOGLOBIN A1C
Hgb A1c MFr Bld: 8.6 % — ABNORMAL HIGH (ref 4.8–5.6)
Mean Plasma Glucose: 200.12 mg/dL

## 2024-04-02 LAB — TSH: TSH: 0.693 u[IU]/mL (ref 0.350–4.500)

## 2024-04-02 LAB — T4, FREE: Free T4: 1.73 ng/dL — ABNORMAL HIGH (ref 0.61–1.12)

## 2024-04-03 LAB — MICROALBUMIN / CREATININE URINE RATIO
Creatinine, Urine: 330.7 mg/dL
Microalb Creat Ratio: 109 mg/g{creat} — ABNORMAL HIGH (ref 0–29)
Microalb, Ur: 359.6 ug/mL — ABNORMAL HIGH

## 2024-04-08 ENCOUNTER — Inpatient Hospital Stay: Attending: Hematology

## 2024-04-08 DIAGNOSIS — N189 Chronic kidney disease, unspecified: Secondary | ICD-10-CM | POA: Diagnosis not present

## 2024-04-08 DIAGNOSIS — E1122 Type 2 diabetes mellitus with diabetic chronic kidney disease: Secondary | ICD-10-CM | POA: Diagnosis not present

## 2024-04-08 DIAGNOSIS — D126 Benign neoplasm of colon, unspecified: Secondary | ICD-10-CM | POA: Diagnosis not present

## 2024-04-08 DIAGNOSIS — Z79899 Other long term (current) drug therapy: Secondary | ICD-10-CM | POA: Diagnosis not present

## 2024-04-08 DIAGNOSIS — I130 Hypertensive heart and chronic kidney disease with heart failure and stage 1 through stage 4 chronic kidney disease, or unspecified chronic kidney disease: Secondary | ICD-10-CM | POA: Diagnosis not present

## 2024-04-08 DIAGNOSIS — D500 Iron deficiency anemia secondary to blood loss (chronic): Secondary | ICD-10-CM | POA: Diagnosis not present

## 2024-04-08 DIAGNOSIS — D631 Anemia in chronic kidney disease: Secondary | ICD-10-CM | POA: Diagnosis not present

## 2024-04-08 LAB — CBC WITH DIFFERENTIAL/PLATELET
Abs Immature Granulocytes: 0.06 K/uL (ref 0.00–0.07)
Basophils Absolute: 0 K/uL (ref 0.0–0.1)
Basophils Relative: 0 %
Eosinophils Absolute: 0.2 K/uL (ref 0.0–0.5)
Eosinophils Relative: 1 %
HCT: 44.8 % (ref 36.0–46.0)
Hemoglobin: 14.6 g/dL (ref 12.0–15.0)
Immature Granulocytes: 0 %
Lymphocytes Relative: 12 %
Lymphs Abs: 1.6 K/uL (ref 0.7–4.0)
MCH: 31.1 pg (ref 26.0–34.0)
MCHC: 32.6 g/dL (ref 30.0–36.0)
MCV: 95.3 fL (ref 80.0–100.0)
Monocytes Absolute: 0.8 K/uL (ref 0.1–1.0)
Monocytes Relative: 6 %
Neutro Abs: 10.9 K/uL — ABNORMAL HIGH (ref 1.7–7.7)
Neutrophils Relative %: 81 %
Platelets: 237 K/uL (ref 150–400)
RBC: 4.7 MIL/uL (ref 3.87–5.11)
RDW: 12.9 % (ref 11.5–15.5)
WBC: 13.6 K/uL — ABNORMAL HIGH (ref 4.0–10.5)
nRBC: 0 % (ref 0.0–0.2)

## 2024-04-08 LAB — IRON AND TIBC
Iron: 113 ug/dL (ref 28–170)
Saturation Ratios: 37 % — ABNORMAL HIGH (ref 10.4–31.8)
TIBC: 304 ug/dL (ref 250–450)
UIBC: 191 ug/dL

## 2024-04-08 LAB — FERRITIN: Ferritin: 242 ng/mL (ref 11–307)

## 2024-04-15 ENCOUNTER — Inpatient Hospital Stay (HOSPITAL_BASED_OUTPATIENT_CLINIC_OR_DEPARTMENT_OTHER): Admitting: Oncology

## 2024-04-15 VITALS — BP 115/66 | HR 86 | Temp 97.6°F | Resp 18 | Ht 63.0 in | Wt 251.0 lb

## 2024-04-15 DIAGNOSIS — D631 Anemia in chronic kidney disease: Secondary | ICD-10-CM | POA: Diagnosis not present

## 2024-04-15 DIAGNOSIS — N189 Chronic kidney disease, unspecified: Secondary | ICD-10-CM | POA: Diagnosis not present

## 2024-04-15 DIAGNOSIS — D500 Iron deficiency anemia secondary to blood loss (chronic): Secondary | ICD-10-CM

## 2024-04-15 DIAGNOSIS — E1122 Type 2 diabetes mellitus with diabetic chronic kidney disease: Secondary | ICD-10-CM | POA: Diagnosis not present

## 2024-04-15 DIAGNOSIS — D126 Benign neoplasm of colon, unspecified: Secondary | ICD-10-CM | POA: Diagnosis not present

## 2024-04-15 DIAGNOSIS — I130 Hypertensive heart and chronic kidney disease with heart failure and stage 1 through stage 4 chronic kidney disease, or unspecified chronic kidney disease: Secondary | ICD-10-CM | POA: Diagnosis not present

## 2024-04-15 DIAGNOSIS — Z79899 Other long term (current) drug therapy: Secondary | ICD-10-CM | POA: Diagnosis not present
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
## 2024-04-15 NOTE — Assessment & Plan Note (Addendum)
-  Received 400 mg IV Venofer  on 12/25/2023 with good tolerance. -Had 2 out of 3 positive stool occult cards back in November 2024.   -Had repeat colonoscopy and EGD on 12/08/2023 which showed many polyps in her colon (tubular adenoma and 1 tubulovillous adenoma) and gastric polyp was hyperplastic.  She will repeat her colonoscopy in 1 year and EGD in 3 years. -Labs from 04/08/2024 are stable improved from previous. -Previous labs showed mildly elevated LDH 201.  Normal SPEP.  Normal B12, MMA, folate, copper . -Etiology of anemia is secondary to IDA and CKD. -Continue oral iron  tablets.  We discussed taking at least 6 hours apart from her acid reflux medication and may take with orange juice or vitamin D  supplement to increase absorption. -Return to clinic in 6 months with labs a few days before. -If labs are stable, patient may be discharged from clinic and follow-up with PCP.

## 2024-04-26 DIAGNOSIS — I4891 Unspecified atrial fibrillation: Secondary | ICD-10-CM | POA: Diagnosis not present

## 2024-04-26 DIAGNOSIS — I5033 Acute on chronic diastolic (congestive) heart failure: Secondary | ICD-10-CM | POA: Diagnosis not present

## 2024-04-26 DIAGNOSIS — J9601 Acute respiratory failure with hypoxia: Secondary | ICD-10-CM | POA: Diagnosis not present

## 2024-04-27 ENCOUNTER — Encounter: Payer: Self-pay | Admitting: Internal Medicine

## 2024-04-27 ENCOUNTER — Ambulatory Visit: Admitting: Internal Medicine

## 2024-04-27 VITALS — BP 101/69 | HR 71 | Ht 63.0 in | Wt 259.0 lb

## 2024-04-27 DIAGNOSIS — J9611 Chronic respiratory failure with hypoxia: Secondary | ICD-10-CM | POA: Diagnosis not present

## 2024-04-27 DIAGNOSIS — Z87891 Personal history of nicotine dependence: Secondary | ICD-10-CM

## 2024-04-27 DIAGNOSIS — J449 Chronic obstructive pulmonary disease, unspecified: Secondary | ICD-10-CM

## 2024-04-27 NOTE — Patient Instructions (Signed)
Make sure you check your oxygen saturation  AT  your highest level of activity (not after you stop)   to be sure it stays over 90% and adjust  02 flow upward to maintain this level if needed but remember to turn it back to previous settings when you stop (to conserve your supply).    Please schedule a follow up visit in  6 months but call sooner if needed  
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
## 2024-04-29 DIAGNOSIS — L603 Nail dystrophy: Secondary | ICD-10-CM | POA: Diagnosis not present

## 2024-04-30 ENCOUNTER — Encounter: Payer: Self-pay | Admitting: Internal Medicine

## 2024-04-30 NOTE — Assessment & Plan Note (Addendum)
 Quit smoking 1980 then gained over a hundred pounds - PFT's  12/04/16  FEV1 1.91 (85 % ) ratio 0.79  p 13 % improvement from saba p 0 prior to study with DLCO  12.14 (50%)   and FV curve min concave  and ERV 32% at wt 174    - 04/29/2022  After extensive coaching inhaler device,  effectiveness =    75% hfa  try change trelegy to Breztri  and approp saba  - 03/10/2023  After extensive coaching inhaler device,  effectiveness =    0% so changed to duoneb qid with bud 0.25 bid  - 10/29/2023 pt on trelegy and duoneb/bud as maint so rec try off trelegy for a week then duoneb and if not difference just use the duoneb/ bud as maint or trlegy but not both  >>> 04/27/2024 doing fine off trelegy and jsut on duoneb / bud bid  so no changes needed

## 2024-04-30 NOTE — Assessment & Plan Note (Addendum)
 As of 04/29/2022 using 02 2lpm and prn  - 05/23/22  ONO on LPM desats only in first hour so no change other than to wear it properly q HS  -  02 on arrival 03/10/2023  = 85% RA  - ONO on 2lpm 03/10/2023 >>> ok thru most of the night though low to start  - HCO3  10/17/23   22 - HCO3  04/02/24   28    Presently on 02 2lpm hs and prn daytime with resting hypoxemia and tendency to fluid retention so rec more attention to daytime sats > 90% at all times with 02 she already has access to at home and while  out      Each maintenance medication was reviewed in detail including emphasizing most importantly the difference between maintenance and prns and under what circumstances the prns are to be triggered using an action plan format where appropriate.  Total time for H and P, chart review, counseling, reviewing neb/ 02/ pulse ox  device(s) and generating customized AVS unique to this office visit / same day charting = 32 min

## 2024-05-08 DIAGNOSIS — E1165 Type 2 diabetes mellitus with hyperglycemia: Secondary | ICD-10-CM | POA: Diagnosis not present

## 2024-05-08 DIAGNOSIS — I509 Heart failure, unspecified: Secondary | ICD-10-CM | POA: Diagnosis not present

## 2024-05-21 ENCOUNTER — Other Ambulatory Visit (HOSPITAL_COMMUNITY): Payer: Self-pay

## 2024-05-24 ENCOUNTER — Encounter: Payer: Self-pay | Admitting: *Deleted

## 2024-05-25 ENCOUNTER — Telehealth: Payer: Self-pay | Admitting: "Endocrinology

## 2024-05-25 ENCOUNTER — Telehealth: Payer: Self-pay | Admitting: Pharmacy Technician

## 2024-05-25 ENCOUNTER — Other Ambulatory Visit (HOSPITAL_COMMUNITY): Payer: Self-pay

## 2024-05-25 NOTE — Telephone Encounter (Signed)
 Pharmacy Patient Advocate Encounter   Received notification from Onbase that prior authorization for Mounjaro  2.5MG /0.5ML auto-injectors  is required/requested.   Insurance verification completed.   The patient is insured through [facility].   Per test claim: PA required; PA submitted to above mentioned insurance via Latent Key/confirmation #/EOC [id number] Status is pending

## 2024-05-25 NOTE — Telephone Encounter (Signed)
 Pt did labs on 04/02/24 at [place] office.  Looks like didn't do the lipid.  Will these work for this visit or does she need to do them again for visit on 06/03/24?

## 2024-05-26 NOTE — Telephone Encounter (Signed)
 Pharmacy Patient Advocate Encounter  Received notification from [facility] that Prior Authorization for Mounjaro  2.5MG /0.5ML auto-injectors has been DENIED.  Full denial letter will be uploaded to the media tab. See denial reason below.   PA #/Case ID/Reference #: [id number]

## 2024-06-03 ENCOUNTER — Other Ambulatory Visit (HOSPITAL_COMMUNITY): Payer: Self-pay | Admitting: Nephrology

## 2024-06-03 ENCOUNTER — Encounter: Payer: Self-pay | Admitting: "Endocrinology

## 2024-06-03 ENCOUNTER — Ambulatory Visit (INDEPENDENT_AMBULATORY_CARE_PROVIDER_SITE_OTHER): Admitting: "Endocrinology

## 2024-06-03 VITALS — BP 100/68 | HR 60 | Ht 63.0 in | Wt 256.0 lb

## 2024-06-03 DIAGNOSIS — N1831 Chronic kidney disease, stage 3a: Secondary | ICD-10-CM

## 2024-06-03 DIAGNOSIS — E2740 Unspecified adrenocortical insufficiency: Secondary | ICD-10-CM

## 2024-06-03 DIAGNOSIS — Z794 Long term (current) use of insulin: Secondary | ICD-10-CM | POA: Diagnosis not present

## 2024-06-03 DIAGNOSIS — E782 Mixed hyperlipidemia: Secondary | ICD-10-CM | POA: Diagnosis not present

## 2024-06-03 DIAGNOSIS — E039 Hypothyroidism, unspecified: Secondary | ICD-10-CM

## 2024-06-03 DIAGNOSIS — I10 Essential (primary) hypertension: Secondary | ICD-10-CM | POA: Diagnosis not present

## 2024-06-03 DIAGNOSIS — E1165 Type 2 diabetes mellitus with hyperglycemia: Secondary | ICD-10-CM | POA: Diagnosis not present

## 2024-06-03 DIAGNOSIS — R809 Proteinuria, unspecified: Secondary | ICD-10-CM

## 2024-06-03 MED ORDER — TIRZEPATIDE 5 MG/0.5ML ~~LOC~~ SOAJ: 5.0000 mg | SUBCUTANEOUS | 1 refills | Status: AC

## 2024-06-03 MED ORDER — FREESTYLE LIBRE 3 PLUS SENSOR MISC: 1 refills | Status: AC

## 2024-06-03 NOTE — Patient Instructions (Signed)

## 2024-06-03 NOTE — Telephone Encounter (Signed)
 Pt is scheduled to be seen today. Will discuss at time of appt.
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
## 2024-06-16 ENCOUNTER — Ambulatory Visit (HOSPITAL_COMMUNITY)
Admission: RE | Admit: 2024-06-16 | Discharge: 2024-06-16 | Disposition: A | Discharge status: Home / Self Care | Source: Ambulatory Visit | Attending: Nephrology | Admitting: Nephrology

## 2024-06-16 DIAGNOSIS — N1831 Chronic kidney disease, stage 3a: Secondary | ICD-10-CM | POA: Insufficient documentation

## 2024-06-16 DIAGNOSIS — R809 Proteinuria, unspecified: Secondary | ICD-10-CM | POA: Diagnosis present

## 2024-07-01 ENCOUNTER — Ambulatory Visit: Admitting: Gastroenterology

## 2024-07-01 ENCOUNTER — Encounter: Payer: Self-pay | Admitting: Gastroenterology

## 2024-07-02 ENCOUNTER — Ambulatory Visit: Admitting: Urology

## 2024-07-02 ENCOUNTER — Encounter: Payer: Self-pay | Admitting: Urology

## 2024-07-02 VITALS — BP 99/70 | HR 83

## 2024-07-02 DIAGNOSIS — N390 Urinary tract infection, site not specified: Secondary | ICD-10-CM

## 2024-07-02 DIAGNOSIS — N3281 Overactive bladder: Secondary | ICD-10-CM

## 2024-07-02 LAB — MICROSCOPIC EXAMINATION

## 2024-07-02 LAB — URINALYSIS, ROUTINE W REFLEX MICROSCOPIC
Bilirubin, UA: NEGATIVE
Glucose, UA: NEGATIVE
Ketones, UA: NEGATIVE
Nitrite, UA: NEGATIVE
Protein,UA: NEGATIVE
RBC, UA: NEGATIVE
Specific Gravity, UA: 1.015 (ref 1.005–1.030)
Urobilinogen, Ur: 0.2 mg/dL (ref 0.2–1.0)
pH, UA: 6.5 (ref 5.0–7.5)

## 2024-07-02 NOTE — Patient Instructions (Signed)
 Botulinum Toxin Bladder Injection  A botulinum toxin bladder injection is a procedure to treat an overactive bladder. During the procedure, a drug called botulinum toxin is injected into the bladder through a long, thin needle. This drug relaxes the bladder muscles and reduces overactivity. You may need this procedure if your medicines are not working or you cannot take them. The procedure may be repeated as needed. The treatment is done once and it usually lasts for 6 months. Your health care provider will monitor you to see how well you respond. Tell a health care provider about: Any allergies you have. All medicines you are taking, including vitamins, herbs, eye drops, creams, and over-the-counter medicines. Any problems you or family members have had with anesthetic medicines. Any bleeding problems you have. Any surgeries you have had. Any medical conditions you have. Any previous reactions to a botulinum toxin injection. Any symptoms of urinary tract infection. These include chills, fever, a burning feeling when passing urine, and needing to pass urine often. Whether you are pregnant or may be pregnant. What are the risks? Generally this is a safe procedure. However, problems may occur, including: Not being able to pass urine. If this happens, you may need to have your bladder emptied with a thin tube (urinary catheter). Bleeding. Urinary tract infection. Allergic reaction to the botulinum toxin. Pain or burning when passing urine. Damage to nearby structures or organs. What happens before the procedure? When to stop eating and drinking Follow instructions from your health care provider about what you may eat and drink before your procedure. These may include: 8 hours before the procedure Stop eating most foods. Do not eat meat, fried foods, or fatty foods. Eat only light foods, such as toast or crackers. All liquids are okay except energy drinks and alcohol. 6 hours before the  procedure Stop eating. Drink only clear liquids, such as water, clear fruit juice, black coffee, plain tea, and sports drinks. Do not drink energy drinks or alcohol. 2 hours before the procedure Stop drinking all liquids. You may be allowed to take medicines with small sips of water. If you do not follow your health care provider's instructions, your procedure may be delayed or canceled. Medicines Ask your health care provider about: Changing or stopping your regular medicines. This is especially important if you are taking diabetes medicines or blood thinners. Taking medicines such as aspirin and ibuprofen. These medicines can thin your blood. Do not take these medicines unless your health care provider tells you to take them. Taking over-the-counter medicines, vitamins, herbs, and supplements. General instructions Ask your health care provider what steps will be taken to help prevent infection. These steps may include: Removing hair at the procedure site. Washing skin with a germ-killing soap. Taking antibiotic medicine. If you will be going home right after the procedure, plan to have a responsible adult: Take you home from the hospital or clinic. You will not be allowed to drive. Care for you for the time you are told. What happens during the procedure?  You will be asked to empty your bladder. An IV will be inserted into one of your veins. You will be given one or more of the following: A medicine to help you relax (sedative). A medicine to numb the area (local anesthetic). A medicine to make you fall asleep (general anesthetic). A long, thin scope called a cystoscope will be passed into your bladder through the part of the body that carries urine from your bladder (urethra). The cystoscope  will be used to fill your bladder with water. A long needle will be passed through the cystoscope and into the bladder. The botulinum toxin will be injected into your bladder. It may be  injected into multiple areas of your bladder. The cystoscope will be removed and your bladder will be emptied with a urinary catheter. The procedure may vary among health care providers and hospitals. What can I expect after the procedure? After your procedure, it is common to have: Blood-tinged urine. Burning or soreness when you pass urine. Follow these instructions at home: Medicines Take over-the-counter and prescription medicines only as told by your health care provider. If you were prescribed an antibiotic medicine, take it as told by your health care provider. Do not stop using the antibiotic even if you start to feel better. General instructions  If you were given a sedative during the procedure, it can affect you for several hours. Do not drive or operate machinery until your health care provider says that it is safe. Drink enough fluid to keep your urine pale yellow. Return to your normal activities as told by your health care provider. Ask your health care provider what activities are safe for you. Keep all follow-up visits. Contact a health care provider if you have: A fever or chills. Blood-tinged urine for more than one day after your procedure. Worsening pain or burning when you pass urine. Pain or burning when passing urine for more than two days after your procedure. Trouble emptying your bladder. Get help right away if you: Have bright red blood in your urine. Are unable to pass urine. Summary A botulinum toxin bladder injection is a procedure to treat an overactive bladder. This is generally a safe procedure. However, problems may occur, including not being able to pass urine, bleeding, infection, pain, and an allergic reaction to the botulinum toxin. You will be told when to stop eating and drinking, and what medicines to change or stop. Follow instructions carefully. After the procedure, it is common to have blood in your urine and to have soreness or burning when  passing urine. Contact a health care provider if you have a fever, blood in your urine for more than a few days, or trouble passing urine. Get help right away if you have bright red blood in your urine, or if you are unable to pass urine. This information is not intended to replace advice given to you by your health care provider. Make sure you discuss any questions you have with your health care provider. Document Revised: 11/17/2020 Document Reviewed: 11/17/2020 Elsevier Patient Education  2024 [employer].
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
## 2024-10-07 ENCOUNTER — Inpatient Hospital Stay

## 2024-10-13 ENCOUNTER — Ambulatory Visit: Admitting: "Endocrinology

## 2024-10-14 ENCOUNTER — Inpatient Hospital Stay: Admitting: Oncology
